# Patient Record
Sex: Male | Born: 1935
Health system: Southern US, Community
[De-identification: ages and names within clinical notes are randomized; demographics above are authoritative.]

## PROBLEM LIST (undated history)

## (undated) DIAGNOSIS — Z9981 Dependence on supplemental oxygen: Secondary | ICD-10-CM

## (undated) DIAGNOSIS — R310 Gross hematuria: Secondary | ICD-10-CM

## (undated) DIAGNOSIS — I5030 Unspecified diastolic (congestive) heart failure: Secondary | ICD-10-CM

## (undated) DIAGNOSIS — K5730 Diverticulosis of large intestine without perforation or abscess without bleeding: Secondary | ICD-10-CM

## (undated) DIAGNOSIS — R5383 Other fatigue: Secondary | ICD-10-CM

## (undated) DIAGNOSIS — M549 Dorsalgia, unspecified: Secondary | ICD-10-CM

## (undated) DIAGNOSIS — I2510 Atherosclerotic heart disease of native coronary artery without angina pectoris: Secondary | ICD-10-CM

## (undated) DIAGNOSIS — I10 Essential (primary) hypertension: Secondary | ICD-10-CM

## (undated) DIAGNOSIS — N183 Chronic kidney disease, stage 3 (moderate): Secondary | ICD-10-CM

## (undated) DIAGNOSIS — H9190 Unspecified hearing loss, unspecified ear: Secondary | ICD-10-CM

## (undated) DIAGNOSIS — R0600 Dyspnea, unspecified: Secondary | ICD-10-CM

## (undated) DIAGNOSIS — I739 Peripheral vascular disease, unspecified: Secondary | ICD-10-CM

## (undated) DIAGNOSIS — J189 Pneumonia, unspecified organism: Secondary | ICD-10-CM

## (undated) DIAGNOSIS — G8929 Other chronic pain: Secondary | ICD-10-CM

## (undated) DIAGNOSIS — T8859XA Other complications of anesthesia, initial encounter: Secondary | ICD-10-CM

## (undated) DIAGNOSIS — I509 Heart failure, unspecified: Secondary | ICD-10-CM

## (undated) DIAGNOSIS — T4145XA Adverse effect of unspecified anesthetic, initial encounter: Secondary | ICD-10-CM

## (undated) DIAGNOSIS — E119 Type 2 diabetes mellitus without complications: Secondary | ICD-10-CM

## (undated) DIAGNOSIS — IMO0001 Reserved for inherently not codable concepts without codable children: Secondary | ICD-10-CM

## (undated) DIAGNOSIS — C679 Malignant neoplasm of bladder, unspecified: Secondary | ICD-10-CM

## (undated) DIAGNOSIS — L409 Psoriasis, unspecified: Secondary | ICD-10-CM

## (undated) DIAGNOSIS — L408 Other psoriasis: Secondary | ICD-10-CM

## (undated) DIAGNOSIS — L03116 Cellulitis of left lower limb: Secondary | ICD-10-CM

## (undated) DIAGNOSIS — M1990 Unspecified osteoarthritis, unspecified site: Secondary | ICD-10-CM

## (undated) DIAGNOSIS — J449 Chronic obstructive pulmonary disease, unspecified: Secondary | ICD-10-CM

## (undated) DIAGNOSIS — N400 Enlarged prostate without lower urinary tract symptoms: Secondary | ICD-10-CM

## (undated) DIAGNOSIS — M79606 Pain in leg, unspecified: Secondary | ICD-10-CM

## (undated) HISTORY — DX: Dorsalgia, unspecified: M54.9

## (undated) HISTORY — DX: Malignant neoplasm of bladder, unspecified: C67.9

## (undated) HISTORY — DX: Cellulitis of left lower limb: L03.116

## (undated) HISTORY — DX: Benign prostatic hyperplasia without lower urinary tract symptoms: N40.0

## (undated) HISTORY — PX: PENILE PROSTHESIS IMPLANT: SHX240

## (undated) HISTORY — PX: VASECTOMY: SHX75

## (undated) HISTORY — DX: Other psoriasis: L40.8

## (undated) HISTORY — DX: Type 2 diabetes mellitus without complications: E11.9

## (undated) HISTORY — DX: Atherosclerotic heart disease of native coronary artery without angina pectoris: I25.10

## (undated) HISTORY — DX: Diverticulosis of large intestine without perforation or abscess without bleeding: K57.30

## (undated) HISTORY — PX: CARDIAC CATHETERIZATION: SHX172

## (undated) HISTORY — DX: Unspecified diastolic (congestive) heart failure: I50.30

## (undated) HISTORY — DX: Essential (primary) hypertension: I10

## (undated) HISTORY — PX: INCISION AND DRAINAGE FOOT: SHX1800

## (undated) HISTORY — PX: CATARACT EXTRACTION W/ INTRAOCULAR LENS  IMPLANT, BILATERAL: SHX1307

---
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
## 1960-11-11 HISTORY — PX: TONSILLECTOMY AND ADENOIDECTOMY: SUR1326

## 2004-08-11 LAB — HM COLONOSCOPY

## 2004-09-27 ENCOUNTER — Ambulatory Visit: Payer: Self-pay | Admitting: Internal Medicine

## 2005-01-06 ENCOUNTER — Ambulatory Visit: Payer: Self-pay | Admitting: Internal Medicine

## 2005-01-06 ENCOUNTER — Inpatient Hospital Stay (HOSPITAL_COMMUNITY): Admission: EM | Admit: 2005-01-06 | Discharge: 2005-01-08 | Payer: Self-pay | Admitting: Emergency Medicine

## 2005-01-10 ENCOUNTER — Encounter: Admission: RE | Admit: 2005-01-10 | Discharge: 2005-04-10 | Payer: Self-pay | Admitting: Internal Medicine

## 2005-01-15 ENCOUNTER — Ambulatory Visit: Payer: Self-pay | Admitting: Internal Medicine

## 2005-01-21 ENCOUNTER — Ambulatory Visit: Payer: Self-pay | Admitting: Internal Medicine

## 2005-01-30 ENCOUNTER — Encounter (HOSPITAL_BASED_OUTPATIENT_CLINIC_OR_DEPARTMENT_OTHER): Admission: RE | Admit: 2005-01-30 | Discharge: 2005-02-06 | Payer: Self-pay | Admitting: Internal Medicine

## 2005-02-01 ENCOUNTER — Encounter: Admission: RE | Admit: 2005-02-01 | Discharge: 2005-02-01 | Payer: Self-pay | Admitting: Internal Medicine

## 2005-02-02 ENCOUNTER — Emergency Department (HOSPITAL_COMMUNITY): Admission: EM | Admit: 2005-02-02 | Discharge: 2005-02-02 | Payer: Self-pay | Admitting: Emergency Medicine

## 2005-02-04 ENCOUNTER — Ambulatory Visit: Payer: Self-pay | Admitting: Internal Medicine

## 2005-02-04 ENCOUNTER — Encounter: Admission: RE | Admit: 2005-02-04 | Discharge: 2005-02-04 | Payer: Self-pay | Admitting: Internal Medicine

## 2005-02-11 ENCOUNTER — Ambulatory Visit: Payer: Self-pay | Admitting: Internal Medicine

## 2005-02-11 ENCOUNTER — Encounter (HOSPITAL_BASED_OUTPATIENT_CLINIC_OR_DEPARTMENT_OTHER): Admission: RE | Admit: 2005-02-11 | Discharge: 2005-05-12 | Payer: Self-pay | Admitting: Surgery

## 2005-02-18 ENCOUNTER — Ambulatory Visit: Payer: Self-pay | Admitting: Internal Medicine

## 2005-03-18 ENCOUNTER — Ambulatory Visit: Payer: Self-pay | Admitting: Internal Medicine

## 2005-05-20 ENCOUNTER — Ambulatory Visit: Payer: Self-pay | Admitting: Internal Medicine

## 2005-05-27 ENCOUNTER — Ambulatory Visit: Payer: Self-pay | Admitting: Internal Medicine

## 2005-06-19 ENCOUNTER — Ambulatory Visit: Payer: Self-pay | Admitting: Internal Medicine

## 2005-08-20 ENCOUNTER — Ambulatory Visit: Payer: Self-pay | Admitting: Internal Medicine

## 2005-11-11 HISTORY — PX: CARDIOVASCULAR STRESS TEST: SHX262

## 2005-11-18 ENCOUNTER — Ambulatory Visit: Payer: Self-pay | Admitting: Internal Medicine

## 2006-02-17 ENCOUNTER — Ambulatory Visit: Payer: Self-pay | Admitting: Internal Medicine

## 2006-05-19 ENCOUNTER — Ambulatory Visit: Payer: Self-pay | Admitting: Internal Medicine

## 2006-05-23 ENCOUNTER — Encounter: Payer: Self-pay | Admitting: Internal Medicine

## 2006-05-23 ENCOUNTER — Ambulatory Visit: Payer: Self-pay

## 2006-07-28 ENCOUNTER — Ambulatory Visit: Payer: Self-pay | Admitting: Internal Medicine

## 2006-09-04 ENCOUNTER — Ambulatory Visit: Payer: Self-pay | Admitting: Internal Medicine

## 2006-10-27 ENCOUNTER — Ambulatory Visit: Payer: Self-pay | Admitting: Internal Medicine

## 2006-10-27 LAB — CONVERTED CEMR LAB: Hgb A1c MFr Bld: 10.7 % — ABNORMAL HIGH (ref 4.6–6.0)

## 2007-01-20 ENCOUNTER — Ambulatory Visit: Payer: Self-pay | Admitting: Internal Medicine

## 2007-01-20 LAB — CONVERTED CEMR LAB: Hgb A1c MFr Bld: 7.5 % — ABNORMAL HIGH (ref 4.6–6.0)

## 2007-04-21 ENCOUNTER — Ambulatory Visit: Payer: Self-pay | Admitting: Internal Medicine

## 2007-04-21 LAB — CONVERTED CEMR LAB: Hgb A1c MFr Bld: 7.1 % — ABNORMAL HIGH (ref 4.6–6.0)

## 2007-05-21 ENCOUNTER — Ambulatory Visit: Payer: Self-pay | Admitting: Internal Medicine

## 2007-05-21 ENCOUNTER — Encounter: Payer: Self-pay | Admitting: Internal Medicine

## 2007-05-21 DIAGNOSIS — I959 Hypotension, unspecified: Secondary | ICD-10-CM | POA: Insufficient documentation

## 2007-05-21 DIAGNOSIS — N400 Enlarged prostate without lower urinary tract symptoms: Secondary | ICD-10-CM | POA: Insufficient documentation

## 2007-05-21 DIAGNOSIS — E119 Type 2 diabetes mellitus without complications: Secondary | ICD-10-CM | POA: Insufficient documentation

## 2007-05-21 DIAGNOSIS — K5730 Diverticulosis of large intestine without perforation or abscess without bleeding: Secondary | ICD-10-CM

## 2007-05-21 DIAGNOSIS — I10 Essential (primary) hypertension: Secondary | ICD-10-CM

## 2007-05-21 DIAGNOSIS — E1142 Type 2 diabetes mellitus with diabetic polyneuropathy: Secondary | ICD-10-CM | POA: Insufficient documentation

## 2007-05-21 HISTORY — DX: Essential (primary) hypertension: I10

## 2007-05-21 HISTORY — DX: Benign prostatic hyperplasia without lower urinary tract symptoms: N40.0

## 2007-05-21 HISTORY — DX: Type 2 diabetes mellitus without complications: E11.9

## 2007-05-21 HISTORY — DX: Diverticulosis of large intestine without perforation or abscess without bleeding: K57.30

## 2007-08-03 ENCOUNTER — Encounter: Payer: Self-pay | Admitting: Internal Medicine

## 2007-08-11 ENCOUNTER — Ambulatory Visit: Payer: Self-pay | Admitting: Internal Medicine

## 2007-08-21 ENCOUNTER — Ambulatory Visit: Payer: Self-pay | Admitting: Internal Medicine

## 2007-11-16 ENCOUNTER — Ambulatory Visit: Payer: Self-pay | Admitting: Internal Medicine

## 2007-11-16 DIAGNOSIS — J159 Unspecified bacterial pneumonia: Secondary | ICD-10-CM | POA: Insufficient documentation

## 2007-11-25 ENCOUNTER — Ambulatory Visit: Payer: Self-pay | Admitting: Internal Medicine

## 2007-11-26 LAB — CONVERTED CEMR LAB
ALT: 14 units/L (ref 0–53)
AST: 18 units/L (ref 0–37)
Albumin: 3.9 g/dL (ref 3.5–5.2)
Alkaline Phosphatase: 42 units/L (ref 39–117)
BUN: 39 mg/dL — ABNORMAL HIGH (ref 6–23)
Basophils Absolute: 0 10*3/uL (ref 0.0–0.1)
Basophils Relative: 0.2 % (ref 0.0–1.0)
Bilirubin Urine: NEGATIVE
Bilirubin, Direct: 0.1 mg/dL (ref 0.0–0.3)
CO2: 29 meq/L (ref 19–32)
Calcium: 10 mg/dL (ref 8.4–10.5)
Chloride: 102 meq/L (ref 96–112)
Cholesterol: 192 mg/dL (ref 0–200)
Creatinine, Ser: 1.7 mg/dL — ABNORMAL HIGH (ref 0.4–1.5)
Creatinine,U: 96.2 mg/dL
Eosinophils Absolute: 0.2 10*3/uL (ref 0.0–0.6)
Eosinophils Relative: 2.5 % (ref 0.0–5.0)
GFR calc Af Amer: 51 mL/min
GFR calc non Af Amer: 42 mL/min
Glucose, Bld: 137 mg/dL — ABNORMAL HIGH (ref 70–99)
Glucose, Urine, Semiquant: 100
HCT: 36.7 % — ABNORMAL LOW (ref 39.0–52.0)
Hemoglobin: 13.1 g/dL (ref 13.0–17.0)
Hgb A1c MFr Bld: 7.3 % — ABNORMAL HIGH (ref 4.6–6.0)
Ketones, urine, test strip: NEGATIVE
Lymphocytes Relative: 18.9 % (ref 12.0–46.0)
MCHC: 34.9 g/dL (ref 30.0–36.0)
MCV: 94.6 fL (ref 78.0–100.0)
Microalb Creat Ratio: 20.8 mg/g (ref 0.0–30.0)
Microalb, Ur: 2 mg/dL — ABNORMAL HIGH (ref 0.0–1.9)
Monocytes Absolute: 0.6 10*3/uL (ref 0.2–0.7)
Monocytes Relative: 6.6 % (ref 3.0–11.0)
Neutro Abs: 6.4 10*3/uL (ref 1.4–7.7)
Neutrophils Relative %: 71.8 % (ref 43.0–77.0)
Nitrite: NEGATIVE
PSA: 1.84 ng/mL (ref 0.10–4.00)
Platelets: 264 10*3/uL (ref 150–400)
Potassium: 6.1 meq/L (ref 3.5–5.1)
Protein, U semiquant: NEGATIVE
RBC: 3.95 M/uL — ABNORMAL LOW (ref 4.22–5.81)
RDW: 12.5 % (ref 11.5–14.6)
Sodium: 140 meq/L (ref 135–145)
Specific Gravity, Urine: 1.02
TSH: 1.4 microintl units/mL (ref 0.35–5.50)
Total Bilirubin: 0.4 mg/dL (ref 0.3–1.2)
Total Protein: 7.2 g/dL (ref 6.0–8.3)
Urobilinogen, UA: 0.2
WBC Urine, dipstick: NEGATIVE
WBC: 8.7 10*3/uL (ref 4.5–10.5)
pH: 5

## 2007-12-04 ENCOUNTER — Ambulatory Visit: Payer: Self-pay | Admitting: Internal Medicine

## 2007-12-04 DIAGNOSIS — N183 Chronic kidney disease, stage 3 unspecified: Secondary | ICD-10-CM

## 2007-12-04 HISTORY — DX: Chronic kidney disease, stage 3 unspecified: N18.30

## 2007-12-30 ENCOUNTER — Encounter: Payer: Self-pay | Admitting: Internal Medicine

## 2008-02-03 ENCOUNTER — Ambulatory Visit: Payer: Self-pay | Admitting: Internal Medicine

## 2008-04-07 ENCOUNTER — Ambulatory Visit: Payer: Self-pay | Admitting: Internal Medicine

## 2008-04-07 DIAGNOSIS — T25229A Burn of second degree of unspecified foot, initial encounter: Secondary | ICD-10-CM | POA: Insufficient documentation

## 2008-07-01 ENCOUNTER — Ambulatory Visit: Payer: Self-pay | Admitting: Internal Medicine

## 2008-07-22 ENCOUNTER — Encounter: Payer: Self-pay | Admitting: Internal Medicine

## 2008-08-08 ENCOUNTER — Ambulatory Visit: Payer: Self-pay | Admitting: Internal Medicine

## 2008-10-25 ENCOUNTER — Ambulatory Visit: Payer: Self-pay | Admitting: Internal Medicine

## 2008-10-25 DIAGNOSIS — L408 Other psoriasis: Secondary | ICD-10-CM | POA: Insufficient documentation

## 2008-10-25 HISTORY — DX: Other psoriasis: L40.8

## 2008-10-25 LAB — CONVERTED CEMR LAB: Hgb A1c MFr Bld: 7 % — ABNORMAL HIGH (ref 4.6–6.0)

## 2008-11-29 ENCOUNTER — Encounter: Payer: Self-pay | Admitting: Internal Medicine

## 2008-12-30 ENCOUNTER — Encounter: Payer: Self-pay | Admitting: Internal Medicine

## 2009-01-24 ENCOUNTER — Ambulatory Visit: Payer: Self-pay | Admitting: Internal Medicine

## 2009-01-24 LAB — CONVERTED CEMR LAB: Hgb A1c MFr Bld: 7.4 % — ABNORMAL HIGH (ref 4.6–6.5)

## 2009-04-26 ENCOUNTER — Encounter: Payer: Self-pay | Admitting: Internal Medicine

## 2009-04-27 ENCOUNTER — Ambulatory Visit: Payer: Self-pay | Admitting: Internal Medicine

## 2009-04-27 LAB — CONVERTED CEMR LAB
ALT: 11 units/L (ref 0–53)
AST: 17 units/L (ref 0–37)
Albumin: 4 g/dL (ref 3.5–5.2)
Alkaline Phosphatase: 44 units/L (ref 39–117)
BUN: 36 mg/dL — ABNORMAL HIGH (ref 6–23)
Basophils Absolute: 0 10*3/uL (ref 0.0–0.1)
Basophils Relative: 0 % (ref 0.0–3.0)
Bilirubin, Direct: 0 mg/dL (ref 0.0–0.3)
CO2: 30 meq/L (ref 19–32)
Calcium: 9 mg/dL (ref 8.4–10.5)
Chloride: 104 meq/L (ref 96–112)
Cholesterol: 203 mg/dL — ABNORMAL HIGH (ref 0–200)
Creatinine, Ser: 1.8 mg/dL — ABNORMAL HIGH (ref 0.4–1.5)
Direct LDL: 147.6 mg/dL
Eosinophils Absolute: 0.3 10*3/uL (ref 0.0–0.7)
Eosinophils Relative: 4 % (ref 0.0–5.0)
GFR calc non Af Amer: 38.93 mL/min (ref >60)
Glucose, Bld: 96 mg/dL (ref 70–99)
HCT: 40.7 % (ref 39.0–52.0)
HDL: 33.3 mg/dL — ABNORMAL LOW (ref >39.00)
Hemoglobin: 14.1 g/dL (ref 13.0–17.0)
Hgb A1c MFr Bld: 7.4 % — ABNORMAL HIGH (ref 4.6–6.5)
Lymphocytes Relative: 15.3 % (ref 12.0–46.0)
Lymphs Abs: 1.1 10*3/uL (ref 0.7–4.0)
MCHC: 34.5 g/dL (ref 30.0–36.0)
MCV: 96.1 fL (ref 78.0–100.0)
Monocytes Absolute: 0.7 10*3/uL (ref 0.1–1.0)
Monocytes Relative: 9.1 % (ref 3.0–12.0)
Neutro Abs: 5.3 10*3/uL (ref 1.4–7.7)
Neutrophils Relative %: 71.6 % (ref 43.0–77.0)
PSA: 1.78 ng/mL (ref 0.10–4.00)
Platelets: 194 10*3/uL (ref 150.0–400.0)
Potassium: 5.6 meq/L — ABNORMAL HIGH (ref 3.5–5.1)
RBC: 4.24 M/uL (ref 4.22–5.81)
RDW: 12.7 % (ref 11.5–14.6)
Sodium: 143 meq/L (ref 135–145)
TSH: 1.29 microintl units/mL (ref 0.35–5.50)
Total Bilirubin: 0.9 mg/dL (ref 0.3–1.2)
Total CHOL/HDL Ratio: 6
Total Protein: 7.7 g/dL (ref 6.0–8.3)
Triglycerides: 147 mg/dL (ref 0.0–149.0)
VLDL: 29.4 mg/dL (ref 0.0–40.0)
WBC: 7.4 10*3/uL (ref 4.5–10.5)

## 2009-05-01 ENCOUNTER — Telehealth: Payer: Self-pay | Admitting: Internal Medicine

## 2009-07-28 ENCOUNTER — Ambulatory Visit: Payer: Self-pay | Admitting: Internal Medicine

## 2009-08-01 ENCOUNTER — Ambulatory Visit: Payer: Self-pay | Admitting: Internal Medicine

## 2009-08-01 LAB — CONVERTED CEMR LAB
BUN: 46 mg/dL — ABNORMAL HIGH (ref 6–23)
CO2: 29 meq/L (ref 19–32)
Calcium: 9 mg/dL (ref 8.4–10.5)
Chloride: 108 meq/L (ref 96–112)
Creatinine, Ser: 2.1 mg/dL — ABNORMAL HIGH (ref 0.4–1.5)
Creatinine,U: 77.7 mg/dL
GFR calc non Af Amer: 32.57 mL/min (ref >60)
Glucose, Bld: 136 mg/dL — ABNORMAL HIGH (ref 70–99)
Hgb A1c MFr Bld: 7 % — ABNORMAL HIGH (ref 4.6–6.5)
Microalb Creat Ratio: 19.3 mg/g (ref 0.0–30.0)
Microalb, Ur: 1.5 mg/dL (ref 0.0–1.9)
Potassium: 5.5 meq/L — ABNORMAL HIGH (ref 3.5–5.1)
Sodium: 143 meq/L (ref 135–145)

## 2009-08-30 ENCOUNTER — Ambulatory Visit: Payer: Self-pay | Admitting: Internal Medicine

## 2009-09-18 ENCOUNTER — Encounter (INDEPENDENT_AMBULATORY_CARE_PROVIDER_SITE_OTHER): Payer: Self-pay | Admitting: *Deleted

## 2009-09-22 ENCOUNTER — Encounter: Admission: RE | Admit: 2009-09-22 | Discharge: 2009-09-22 | Payer: Self-pay | Admitting: Nephrology

## 2009-10-27 ENCOUNTER — Ambulatory Visit: Payer: Self-pay | Admitting: Internal Medicine

## 2009-10-30 LAB — CONVERTED CEMR LAB: Hgb A1c MFr Bld: 8.2 % — ABNORMAL HIGH (ref 4.6–6.5)

## 2009-11-20 ENCOUNTER — Encounter: Payer: Self-pay | Admitting: Internal Medicine

## 2010-01-05 ENCOUNTER — Encounter: Payer: Self-pay | Admitting: Internal Medicine

## 2010-01-24 ENCOUNTER — Ambulatory Visit: Payer: Self-pay | Admitting: Internal Medicine

## 2010-01-24 LAB — CONVERTED CEMR LAB: Hgb A1c MFr Bld: 8.8 % — ABNORMAL HIGH (ref 4.6–6.5)

## 2010-01-31 ENCOUNTER — Telehealth: Payer: Self-pay | Admitting: Internal Medicine

## 2010-03-19 ENCOUNTER — Encounter: Payer: Self-pay | Admitting: Internal Medicine

## 2010-04-23 ENCOUNTER — Ambulatory Visit: Payer: Self-pay | Admitting: Internal Medicine

## 2010-04-23 LAB — CONVERTED CEMR LAB: Hgb A1c MFr Bld: 7.2 % — ABNORMAL HIGH (ref 4.6–6.5)

## 2010-07-06 ENCOUNTER — Encounter: Payer: Self-pay | Admitting: Internal Medicine

## 2010-07-09 ENCOUNTER — Encounter: Payer: Self-pay | Admitting: Internal Medicine

## 2010-07-11 ENCOUNTER — Encounter: Payer: Self-pay | Admitting: Internal Medicine

## 2010-07-17 ENCOUNTER — Encounter: Payer: Self-pay | Admitting: Internal Medicine

## 2010-07-24 ENCOUNTER — Ambulatory Visit: Payer: Self-pay | Admitting: Internal Medicine

## 2010-07-24 LAB — CONVERTED CEMR LAB: Hgb A1c MFr Bld: 7.5 % — ABNORMAL HIGH (ref 4.6–6.5)

## 2010-07-31 ENCOUNTER — Telehealth: Payer: Self-pay | Admitting: Internal Medicine

## 2010-09-25 ENCOUNTER — Telehealth: Payer: Self-pay | Admitting: Internal Medicine

## 2010-10-23 ENCOUNTER — Ambulatory Visit: Payer: Self-pay | Admitting: Internal Medicine

## 2010-10-23 LAB — HM DIABETES EYE EXAM

## 2010-10-25 LAB — CONVERTED CEMR LAB: Hgb A1c MFr Bld: 8.4 % — ABNORMAL HIGH (ref 4.6–6.5)

## 2010-10-29 ENCOUNTER — Telehealth: Payer: Self-pay

## 2010-11-20 ENCOUNTER — Encounter: Payer: Self-pay | Admitting: Internal Medicine

## 2010-11-22 ENCOUNTER — Encounter
Admission: RE | Admit: 2010-11-22 | Discharge: 2010-11-22 | Payer: Self-pay | Source: Home / Self Care | Attending: Nephrology | Admitting: Nephrology

## 2010-12-09 LAB — CONVERTED CEMR LAB
BUN: 27 mg/dL — ABNORMAL HIGH (ref 6–23)
CO2: 30 meq/L (ref 19–32)
Calcium: 9.7 mg/dL (ref 8.4–10.5)
Chloride: 100 meq/L (ref 96–112)
Creatinine, Ser: 1.4 mg/dL (ref 0.4–1.5)
GFR calc Af Amer: 63 mL/min
GFR calc non Af Amer: 52 mL/min
Glucose, Bld: 106 mg/dL — ABNORMAL HIGH (ref 70–99)
Potassium: 5.7 meq/L — ABNORMAL HIGH (ref 3.5–5.1)
Sodium: 138 meq/L (ref 135–145)

## 2010-12-11 NOTE — Assessment & Plan Note (Signed)
Summary: flu shot//ccm  Nurse Visit  Flu Vaccine Consent Questions     Do you have a history of severe allergic reactions to this vaccine? no    Any prior history of allergic reactions to egg and/or gelatin? no    Do you have a sensitivity to the preservative Thimersol? no    Do you have a past history of Guillan-Barre Syndrome? no    Do you currently have an acute febrile illness? no    Have you ever had a severe reaction to latex? no    Vaccine information given and explained to patient? yes    Are you currently pregnant? no    Lot Number:[id number]   Exp Date:05/10/2010   Site Given  Left Deltoid IM    Allergies: 1)  Amoxicillin (Amoxicillin) 2)  Levaquin (Levofloxacin)  Orders Added: 1)  Admin 1st Vaccine [90471] 2)  Flu Vaccine 2yrs + [id number]

## 2010-12-11 NOTE — Consult Note (Signed)
Summary: [facility]   Imported By: Laural Benes 12/23/2008 13:00:13  _____________________________________________________________________  External Attachment:    Type:   Image     Comment:   External Document

## 2010-12-11 NOTE — Letter (Signed)
Summary: [facility]   Imported By: Laural Benes 04/10/2010 10:25:27  _____________________________________________________________________  External Attachment:    Type:   Image     Comment:   External Document

## 2010-12-11 NOTE — Assessment & Plan Note (Signed)
Summary: 3 month rov/njr   Vital Signs:  Patient profile:   77 year old male Weight:      229 pounds Temp:     97.9 degrees F oral BP sitting:   130 / 70  (right arm) Cuff size:   regular  Vitals Entered By: Cay Schillings LPN (September 13, 624THL 9:51 AM) CC: 3 mos rov - doing well      fbs 121 Is Patient Diabetic? Yes Did you bring your meter with you today? No Flu Vaccine Consent Questions     Do you have a history of severe allergic reactions to this vaccine? no    Any prior history of allergic reactions to egg and/or gelatin? no    Do you have a sensitivity to the preservative Thimersol? no    Do you have a past history of Guillan-Barre Syndrome? no    Do you currently have an acute febrile illness? no    Have you ever had a severe reaction to latex? no    Vaccine information given and explained to patient? yes    Are you currently pregnant? no    Lot Number:[id number]   Exp Date:05/11/2011   Site Given  Left Deltoid IM   CC:  3 mos rov - doing well      fbs 121.  History of Present Illness: 77 year old patient who is seen today for follow-up of his type 2 diabetes.  He has hypertension and chronic kidney disease.  He has recently seen in nephrology and renal indices are pending.  He feels well today.  His only complaint is worsening pedal edema.  Medical regimen includes Actos 45, as well as amlodipine.  Apparently, his potassium was elevated recently  Allergies: 1)  Amoxicillin (Amoxicillin) 2)  Levaquin (Levofloxacin)  Past History:  Past Medical History: Reviewed history from 12/04/2007 and no changes required. Diabetes mellitus, type II Hypertension Benign prostatic hypertrophy Diverticulosis, colon chronic kidney disease hospitalized in 2006 for left foot cellulitis  Past Surgical History: Reviewed history from 04/27/2009 and no changes required. Appendectomy Vasectomy Penile prosthesis Stress test 2007 colonoscopy 5-7 years ago  Review of  Systems       The patient complains of peripheral edema.  The patient denies anorexia, fever, weight loss, weight gain, vision loss, decreased hearing, hoarseness, chest pain, syncope, dyspnea on exertion, prolonged cough, headaches, hemoptysis, abdominal pain, melena, hematochezia, severe indigestion/heartburn, hematuria, incontinence, genital sores, muscle weakness, suspicious skin lesions, transient blindness, difficulty walking, depression, unusual weight change, abnormal bleeding, enlarged lymph nodes, angioedema, breast masses, and testicular masses.    Physical Exam  General:  overweight-appearing.  120/74overweight-appearing.   Head:  Normocephalic and atraumatic without obvious abnormalities. No apparent alopecia or balding. Eyes:  No corneal or conjunctival inflammation noted. EOMI. Perrla. Funduscopic exam benign, without hemorrhages, exudates or papilledema. Vision grossly normal. Mouth:  Oral mucosa and oropharynx without lesions or exudates.  Teeth in good repair. Neck:  No deformities, masses, or tenderness noted. Lungs:  Normal respiratory effort, chest expands symmetrically. Lungs are clear to auscultation, no crackles or wheezes. Heart:  Normal rate and regular rhythm. S1 and S2 normal without gallop, murmur, click, rub or other extra sounds. Abdomen:  Bowel sounds positive,abdomen soft and non-tender without masses, organomegaly or hernias noted. Msk:  No deformity or scoliosis noted of thoracic or lumbar spine.   Extremities:  2+ left pedal edema and 2+ right pedal edema.  2+ left pedal edema and 2+ right pedal edema.     Impression &  Recommendations:  Problem # 1:  CHRONIC KIDNEY DISEASE UNSPECIFIED (ICD-585.9)  Problem # 2:  HYPERTENSION (ICD-401.9)  The following medications were removed from the medication list:    Hydrochlorothiazide 25 Mg Tabs (Hydrochlorothiazide) ..... One daily in the morning His updated medication list for this problem includes:    Lisinopril  40 Mg Tabs (Lisinopril) .Marland Kitchen... 1 once daily    Amlodipine Besylate 5 Mg Tabs (Amlodipine besylate) ..... Qhs    Furosemide 40 Mg Tabs (Furosemide) ..... One daily, as needed for swelling  The following medications were removed from the medication list:    Hydrochlorothiazide 25 Mg Tabs (Hydrochlorothiazide) ..... One daily in the morning His updated medication list for this problem includes:    Lisinopril 40 Mg Tabs (Lisinopril) .Marland Kitchen... 1 once daily    Amlodipine Besylate 5 Mg Tabs (Amlodipine besylate) ..... Qhs    Furosemide 40 Mg Tabs (Furosemide) ..... One daily, as needed for swelling  Problem # 3:  DIABETES MELLITUS, TYPE II (ICD-250.00)  The following medications were removed from the medication list:    Amaryl 4 Mg Tabs (Glimepiride) .Marland Kitchen... 1 once daily    Actos 45 Mg Tabs (Pioglitazone hcl) ..... One daily    Januvia 50 Mg Tabs (Sitagliptin phosphate) ..... One daily His updated medication list for this problem includes:    Aspirin Ec 81 Mg Tbec (Aspirin) .Marland Kitchen... Take 1 tablet by mouth every morning    Lisinopril 40 Mg Tabs (Lisinopril) .Marland Kitchen... 1 once daily    Glipizide 10 Mg Xr24h-tab (Glipizide) ..... One every morning    Actos 15 Mg Tabs (Pioglitazone hcl) ..... One daily    Tradjenta 5 Mg Tabs (Linagliptin) ..... One daily  Orders: Venipuncture [id number]) TLB-A1C / Hgb A1C (Glycohemoglobin) (83036-A1C) Specimen Handling (99000)  Complete Medication List: 1)  Aspirin Ec 81 Mg Tbec (Aspirin) .... Take 1 tablet by mouth every morning 2)  Lisinopril 40 Mg Tabs (Lisinopril) .Marland Kitchen.. 1 once daily 3)  Amlodipine Besylate 5 Mg Tabs (Amlodipine besylate) .... Qhs 4)  Allopurinol 100 Mg Tabs (Allopurinol) .... Qd 5)  Glipizide 10 Mg Xr24h-tab (Glipizide) .... One every morning 6)  Actos 15 Mg Tabs (Pioglitazone hcl) .... One daily 7)  Tradjenta 5 Mg Tabs (Linagliptin) .... One daily 8)  Furosemide 40 Mg Tabs (Furosemide) .... One daily, as needed for swelling  Other Orders: Admin 1st  Vaccine [id number]) Flu Vaccine 28yrs + [id number])  Patient Instructions: 1)  Please schedule a follow-up appointment in 3 months. 2)  Limit your Sodium (Salt). 3)  It is important that you exercise regularly at least 20 minutes 5 times a week. If you develop chest pain, have severe difficulty breathing, or feel very tired , stop exercising immediately and seek medical attention. 4)  You need to lose weight. Consider a lower calorie diet and regular exercise.  5)  Check your blood sugars regularly. If your readings are usually above : or below 70 you should contact our office. 6)  It is important that your Diabetic A1c level is checked every 3 months. Prescriptions: TRADJENTA 5 MG TABS (LINAGLIPTIN) one daily  #90 x 6   Entered and Authorized by:   Marletta Lor  MD   Signed by:   Marletta Lor  MD on 07/24/2010   Method used:   Electronically to        [facility] (retail)             ,  Ph: [phone]       Fax: [phone]   RxIDDB:[id number] ACTOS 15 MG TABS (PIOGLITAZONE HCL) one daily  #90 x 6   Entered and Authorized by:   Marletta Lor  MD   Signed by:   Marletta Lor  MD on 07/24/2010   Method used:   Electronically to        [facility] (retail)             ,          Ph: [phone]       Fax: [phone]   RxIDLC:[id number] GLIPIZIDE 10 MG XR24H-TAB (GLIPIZIDE) one every morning  #90 x 6   Entered and Authorized by:   Marletta Lor  MD   Signed by:   Marletta Lor  MD on 07/24/2010   Method used:   Electronically to        [facility] (retail)             ,          Ph: [phone]       Fax: [phone]   RxIDCS:[id number] ALLOPURINOL 100 MG TABS (ALLOPURINOL) qd  #90 x 6   Entered and Authorized by:   Marletta Lor  MD   Signed by:   Marletta Lor  MD on 07/24/2010   Method used:   Electronically to        [facility] (retail)             ,          Ph: [phone]        Fax: [phone]   RxIDQN:[id number] AMLODIPINE BESYLATE 5 MG TABS (AMLODIPINE BESYLATE) qhs  #90 x 6   Entered and Authorized by:   Marletta Lor  MD   Signed by:   Marletta Lor  MD on 07/24/2010   Method used:   Electronically to        [facility] (retail)             ,          Ph: [phone]       Fax: [phone]   RxIDJZ:[id number] LISINOPRIL 40 MG  TABS (LISINOPRIL) 1 once daily  #90 x 6   Entered and Authorized by:   Marletta Lor  MD   Signed by:   Marletta Lor  MD on 07/24/2010   Method used:   Electronically to        [facility] (retail)             ,          Ph: [phone]       Fax: [phone]   RxIDNF:[id number]

## 2010-12-11 NOTE — Assessment & Plan Note (Signed)
Summary: flu shot/njr  Nurse Visit   Impression & Recommendations: lot [id number].Exp 05/10/2008 [employer] pasteur-left deltoid IM.0.43ml   Complete Medication List: 1)  Actos 45 Mg Tabs (Pioglitazone hcl) 2)  Amaryl 4 Mg Tabs (Glimepiride) .... Take 1 tablet by mouth once a day 3)  Amlodipine Besylate 5 Mg Tabs (Amlodipine besylate) .... Take 1 tablet by mouth once a day 4)  Aspirin Ec 81 Mg Tbec (Aspirin) .... Take 1 tablet by mouth every morning 5)  Lisinopril 40 Mg Tabs (Lisinopril) .Marland Kitchen.. 1 once daily 6)  Metformin Hcl 1000 Mg Tabs (Metformin hcl) .... Take 1 tablet by mouth twice a day 7)  Hydrochlorothiazide 25 Mg Tabs (Hydrochlorothiazide) .Marland Kitchen.. 1 once daily 8)  Actos 15 Mg Tabs (Pioglitazone hcl) .Marland Kitchen.. 1 once daily    Prior Medications: ACTOS 45 MG TABS (PIOGLITAZONE HCL)  AMARYL 4 MG TABS (GLIMEPIRIDE) Take 1 tablet by mouth once a day AMLODIPINE BESYLATE 5 MG TABS (AMLODIPINE BESYLATE) Take 1 tablet by mouth once a day ASPIRIN EC 81 MG TBEC (ASPIRIN) Take 1 tablet by mouth every morning LISINOPRIL 40 MG TABS (LISINOPRIL) 1 once daily METFORMIN HCL 1000 MG TABS (METFORMIN HCL) Take 1 tablet by mouth twice a day HYDROCHLOROTHIAZIDE 25 MG  TABS (HYDROCHLOROTHIAZIDE) 1 once daily ACTOS 15 MG  TABS (PIOGLITAZONE HCL) 1 once daily Current Allergies: AMOXICILLIN (AMOXICILLIN) LEVAQUIN (LEVOFLOXACIN)   Influenza Vaccine    Vaccine Type: Fluvax MCR    Given by: Allyne Gee, LPN  Flu Vaccine Consent Questions    Do you have a history of severe allergic reactions to this vaccine? no    Any prior history of allergic reactions to egg and/or gelatin? no    Do you have a sensitivity to the preservative Thimersol? no    Do you have a past history of Guillan-Barre Syndrome? no    Do you currently have an acute febrile illness? no    Have you ever had a severe reaction to latex? no    Vaccine information given and explained to patient? yes   Orders Added: 1)  Influenza Vaccine  MCR [id number]    ]

## 2010-12-11 NOTE — Assessment & Plan Note (Signed)
Summary: 3 month rov/njr  Or  Vital Signs:  Small profile:   77 year old male Weight:      212 pounds Temp:     97.7 degrees F oral BP sitting:   140 / 78  (left arm) Cuff size:   regular  Vitals Entered By: Chipper Oman, RN (July 28, 2009 10:18 AM) CC: 3 mo ROV, FBS 130 Is Small Diabetic? Yes   CC:  3 mo ROV and FBS 130.  History of Present Illness: [name] Small who is seen today for follow-up.  He has type 2 diabetes, hypertension, and chronic kidney disease.  His blood sugars remains stable. He has discontinued   amlodipine due to dizziness and weakness and has felt much improved.  Blood pressure today was normal.  His last creatinine  was 1.8. Will recheck today and if not 1.6, or less.  Will need to discontinue metformin  Allergies: 1)  Amoxicillin (Amoxicillin) 2)  Levaquin (Levofloxacin)  Past History:  Past Medical History: Reviewed history from 12/04/2007 and no changes required. Diabetes mellitus, type II Hypertension Benign prostatic hypertrophy Diverticulosis, colon chronic kidney disease hospitalized in 2006 for left foot cellulitis  Past Surgical History: Reviewed history from 04/27/2009 and no changes required. Appendectomy Vasectomy Penile prosthesis Stress test 2007 colonoscopy 5-7 years ago  Family History: Reviewed history from 04/27/2009 and no changes required.  father died 39 mother died age 40, diabetes one of the 79  siblings 84 brothers positive for cardiac disease.  Brain tumor, renal failure  Review of Systems  The Small denies anorexia, fever, weight loss, weight gain, vision loss, decreased hearing, hoarseness, chest pain, syncope, dyspnea on exertion, peripheral edema, prolonged cough, headaches, hemoptysis, abdominal pain, melena, hematochezia, severe indigestion/heartburn, hematuria, incontinence, genital sores, muscle weakness, suspicious skin lesions, transient blindness, difficulty walking, depression,  unusual weight change, abnormal bleeding, enlarged lymph nodes, angioedema, breast masses, and testicular masses.    Physical Exam  General:  Well-developed,well-nourished,in no acute distress; alert,appropriate and cooperative throughout examination; 130/80 Head:  Normocephalic and atraumatic without obvious abnormalities. No apparent alopecia or balding. Eyes:  No corneal or conjunctival inflammation noted. EOMI. Perrla. Funduscopic exam benign, without hemorrhages, exudates or papilledema. Vision grossly normal. Mouth:  Oral mucosa and oropharynx without lesions or exudates.  Teeth in good repair. Neck:  No deformities, masses, or tenderness noted. Lungs:  Normal respiratory effort, chest expands symmetrically. Lungs are clear to auscultation, no crackles or wheezes. Heart:  Normal rate and regular rhythm. S1 and S2 normal without gallop, murmur, click, rub or other extra sounds. Abdomen:  Bowel sounds positive,abdomen soft and non-tender without masses, organomegaly or hernias noted. Pulses:  right posterior tibia pulse diminished Extremities:  No clubbing, cyanosis, edema, or deformity noted with normal full range of motion of all joints.    Diabetes Management Exam:    Eye Exam:       Eye Exam done here today          Results: normal   Impression & Recommendations:  Problem # 1:  CHRONIC KIDNEY DISEASE UNSPECIFIED (ICD-585.9)  Orders: Venipuncture [id number]) TLB-BMP (Basic Metabolic Panel-BMET) (99991111)  Problem # 2:  HYPERTENSION (ICD-401.9)  His updated medication list for this problem includes:    Lisinopril 40 Mg Tabs (Lisinopril) .Marland Kitchen... 1 once daily    Hydrochlorothiazide 25 Mg Tabs (Hydrochlorothiazide) ..... One daily in the morning  His updated medication list for this problem includes:    Lisinopril 40 Mg Tabs (Lisinopril) .Marland Kitchen... 1 once daily  Hydrochlorothiazide 25 Mg Tabs (Hydrochlorothiazide) ..... One daily in the morning  Problem # 3:  DIABETES MELLITUS,  TYPE II (ICD-250.00)  His updated medication list for this problem includes:    Aspirin Ec 81 Mg Tbec (Aspirin) .Marland Kitchen... Take 1 tablet by mouth every morning    Metformin Hcl 1000 Mg Tabs (Metformin hcl) .Marland Kitchen... Take 1 tablet by mouth twice a day    Lisinopril 40 Mg Tabs (Lisinopril) .Marland Kitchen... 1 once daily    Amaryl 4 Mg Tabs (Glimepiride) .Marland Kitchen... 1 once daily    His updated medication list for this problem includes:    Aspirin Ec 81 Mg Tbec (Aspirin) .Marland Kitchen... Take 1 tablet by mouth every morning    Metformin Hcl 1000 Mg Tabs (Metformin hcl) .Marland Kitchen... Take 1 tablet by mouth twice a day    Lisinopril 40 Mg Tabs (Lisinopril) .Marland Kitchen... 1 once daily    Amaryl 4 Mg Tabs (Glimepiride) .Marland Kitchen... 1 once daily  Orders: TLB-A1C / Hgb A1C (Glycohemoglobin) (83036-A1C)  Complete Medication List: 1)  Aspirin Ec 81 Mg Tbec (Aspirin) .... Take 1 tablet by mouth every morning 2)  Metformin Hcl 1000 Mg Tabs (Metformin hcl) .... Take 1 tablet by mouth twice a day 3)  Lisinopril 40 Mg Tabs (Lisinopril) .Marland Kitchen.. 1 once daily 4)  Amaryl 4 Mg Tabs (Glimepiride) .Marland Kitchen.. 1 once daily 5)  Hydrochlorothiazide 25 Mg Tabs (Hydrochlorothiazide) .... One daily in the morning  Small Instructions: 1)  Please schedule a follow-up appointment in 3 months. 2)  Limit your Sodium (Salt). 3)  It is important that you exercise regularly at least 20 minutes 5 times a week. If you develop chest pain, have severe difficulty breathing, or feel very tired , stop exercising immediately and seek medical attention. 4)  You need to lose weight. Consider a lower calorie diet and regular exercise.  5)  Check your blood sugars regularly. If your readings are usually above : or below 70 you should contact our office. 6)  It is important that your Diabetic A1c level is checked every 3 months. 7)  See your eye doctor yearly to check for diabetic eye damage. Prescriptions: HYDROCHLOROTHIAZIDE 25 MG TABS (HYDROCHLOROTHIAZIDE) one daily in the morning  #90 x 6    Entered and Authorized by:   Marletta Lor  MD   Signed by:   Marletta Lor  MD on 07/28/2009   Method used:   Electronically to        [facility] (mail-order)             ,          Ph: [phone]       Fax: [phone]   RxIDQB:[id number] AMARYL 4 MG TABS (GLIMEPIRIDE) 1 once daily  #90 x 6   Entered and Authorized by:   Marletta Lor  MD   Signed by:   Marletta Lor  MD on 07/28/2009   Method used:   Electronically to        [facility] (mail-order)             ,          Ph: [phone]       Fax: [phone]   RxIDPJ:[id number] LISINOPRIL 40 MG  TABS (LISINOPRIL) 1 once daily  #90 x 6   Entered and Authorized by:   Marletta Lor  MD   Signed by:   Marletta Lor  MD on 07/28/2009  Method used:   Electronically to        [facility] (mail-order)             ,          Ph: [phone]       Fax: [phone]   RxID:   [id number] METFORMIN HCL 1000 MG TABS (METFORMIN HCL) Take 1 tablet by mouth twice a day  #180 x 6   Entered and Authorized by:   Marletta Lor  MD   Signed by:   Marletta Lor  MD on 07/28/2009   Method used:   Electronically to        [facility] (mail-order)             ,          Ph: [phone]       Fax: [phone]   RxIDWY:[id number]

## 2010-12-11 NOTE — Assessment & Plan Note (Signed)
Summary: FUP/CCMM   Vital Signs:  Patient Profile:   77 Years Old Male Weight:      226 pounds Temp:     97.8 degrees F oral BP sitting:   150 / 64  (left arm)  Vitals Entered By: Chipper Oman, RN (August 11, 2007 8:04 AM)                 Chief Complaint:  ROV. Random BS @home  130.[name] Small  History of Present Illness: 77 year old male seen today for follow-up of his hypertension type 2 diabetes BPH  Current Allergies: AMOXICILLIN (AMOXICILLIN) LEVAQUIN (LEVOFLOXACIN)  Past Surgical History:    Reviewed history from 05/21/2007 and no changes required:       Appendectomy       Vasectomy       Penile prosthesis   Family History:        father died 81mother died age 53, diabetes    one of the aching siblings 6 brothers positive for cardiac disease.  Brain tumor, renal failure    Review of Systems  The patient denies anorexia, fever, weight loss, vision loss, decreased hearing, hoarseness, chest pain, syncope, dyspnea on exhertion, peripheral edema, prolonged cough, hemoptysis, abdominal pain, melena, hematochezia, severe indigestion/heartburn, hematuria, incontinence, genital sores, muscle weakness, suspicious skin lesions, transient blindness, difficulty walking, depression, unusual weight change, abnormal bleeding, enlarged lymph nodes, angioedema, breast masses, and testicular masses.         blood sugarsn the morning have been under nice control nonfasting today 130   Physical Exam  General:     overweight-appearing.   Head:     Normocephalic and atraumatic without obvious abnormalities. No apparent alopecia or balding. Eyes:     No corneal or conjunctival inflammation noted. EOMI. Perrla. Funduscopic exam benign, without hemorrhages, exudates or papilledema. Vision grossly normal. Mouth:     Oral mucosa and oropharynx without lesions or exudates.  Teeth in good repair. Neck:     No deformities, masses, or tenderness noted. Lungs:     Normal respiratory  effort, chest expands symmetrically. Lungs are clear to auscultation, no crackles or wheezes. Heart:     Normal rate and regular rhythm. S1 and S2 normal without gallop, murmur, click, rub or other extra sounds. Abdomen:     Bowel sounds positive,abdomen soft and non-tender without masses, organomegaly or hernias noted. Msk:     No deformity or scoliosis noted of thoracic or lumbar spine.   Extremities:     No clubbing, cyanosis, edema, or deformity noted with normal full range of motion of all joints.      Impression & Recommendations:  Problem # 1:  BENIGN PROSTATIC HYPERTROPHY (ICD-600.00)  Problem # 2:  DIABETES MELLITUS, TYPE II (ICD-250.00)  His updated medication list for this problem includes:    Actos 45 Mg Tabs (Pioglitazone hcl)    Amaryl 4 Mg Tabs (Glimepiride) .[name] Small... Take 1 tablet by mouth once a day    Aspirin Ec 81 Mg Tbec (Aspirin) .[name] Small... Take 1 tablet by mouth every morning    Lisinopril 40 Mg Tabs (Lisinopril) .[name] Small... 1 once daily    Metformin Hcl 1000 Mg Tabs (Metformin hcl) .[name] Small... Take 1 tablet by mouth twice a day    Actos 15 Mg Tabs (Pioglitazone hcl) .[name] Small... 1 once daily   Problem # 3:  HYPERTENSION (ICD-401.9)  His updated medication list for this problem includes:    Amlodipine Besylate 5 Mg Tabs (Amlodipine besylate) .[name] Small... Take 1 tablet by mouth once  a day    Lisinopril 40 Mg Tabs (Lisinopril) .[name] Small... 1 once daily    Hydrochlorothiazide 25 Mg Tabs (Hydrochlorothiazide) .[name] Small... 1 once daily   Complete Medication List: 1)  Actos 45 Mg Tabs (Pioglitazone hcl) 2)  Amaryl 4 Mg Tabs (Glimepiride) .... Take 1 tablet by mouth once a day 3)  Amlodipine Besylate 5 Mg Tabs (Amlodipine besylate) .... Take 1 tablet by mouth once a day 4)  Aspirin Ec 81 Mg Tbec (Aspirin) .... Take 1 tablet by mouth every morning 5)  Lisinopril 40 Mg Tabs (Lisinopril) .[name] Small.. 1 once daily 6)  Metformin Hcl 1000 Mg Tabs (Metformin hcl) .... Take 1 tablet by mouth twice a day 7)   Hydrochlorothiazide 25 Mg Tabs (Hydrochlorothiazide) .[name] Small.. 1 once daily 8)  Actos 15 Mg Tabs (Pioglitazone hcl) .[name] Small.. 1 once daily   Patient Instructions: 1)  Please schedule a follow-up appointment in 4 months. 2)  Advised not to eat any food or drink any liquids after 10 PM the night before your procedure. 3)  Limit your Sodium (Salt). 4)  It is important that you exercise regularly at least 20 minutes 5 times a week. If you develop chest pain, have severe difficulty breathing, or feel very tired , stop exercising immediately and seek medical attention. 5)  You need to lose weight. Consider a lower calorie diet and regular exercise.     ]

## 2010-12-11 NOTE — Letter (Signed)
Summary: [facility]  [facility]   Imported By: Phillis Knack 01/11/2010 11:45:11  _____________________________________________________________________  External Attachment:    Type:   Image     Comment:   External Document

## 2010-12-11 NOTE — Progress Notes (Signed)
Summary: vomiting/diarrhea  Phone Note Call from Patient   Summary of Call: Patient's wife calling to say that patient is vomiting, breaking out in a sweat and having diarrhea that just started today. Patient's wife wants to know if there is something she can do for him. Does he need to be seen or is it just a virus thats going around. [facility]. Wife can be reached at [phone]. Initial call taken by: Marcelino Freestone RMA,  May 01, 2009 1:06 PM  Follow-up for Phone Call        Phenergan 25 mg, number 21 every 6 hours as needed for nausea; clear liquid diet and office visit if any clinical worsening Follow-up by: Marletta Lor  MD,  May 01, 2009 4:56 PM      Appended Document: vomiting/diarrhea called.  Appended Document: vomiting/diarrhea Patient informed. Patient is feeling better and does not want the phenergan. Patient will call and make an appointment if he needs to.
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
## 2010-12-11 NOTE — Assessment & Plan Note (Signed)
Summary: PT WILL COME IN FASTING/NJR   Vital Signs:  Patient profile:   77 year old male Weight:      213 pounds Temp:     97.7 degrees F oral BP sitting:   138 / 66  (left arm) Cuff size:   regular  Vitals Entered By: Chipper Oman, RN (April 27, 2009 9:41 AM)  CC:  OV and fasting. FBS- 119.Marland Kitchen  History of Present Illness: in the 77 year old patient seen today for an extensive evaluation.  Medical problems include hypertension and type 2 diabetes.  He has BPH, history of diverticulosis and chronic kidney disease.  He also has a history of psoriasis.  His last hemoglobin A1c's have been 7.0 and 7.4, respectively.  A fasting blood sugar today 119  Hypertension History:      Positive major cardiovascular risk factors include male age 35 years old or older, diabetes, and hypertension.  Negative major cardiovascular risk factors include non-tobacco-user status.     Allergies: 1)  Amoxicillin (Amoxicillin) 2)  Levaquin (Levofloxacin)  Past History:  Past Medical History: Reviewed history from 12/04/2007 and no changes required. Diabetes mellitus, type II Hypertension Benign prostatic hypertrophy Diverticulosis, colon chronic kidney disease hospitalized in 2006 for left foot cellulitis  Past Surgical History: Appendectomy Vasectomy Penile prosthesis Stress test 2007 colonoscopy 5-7 years ago  Family History: Reviewed history from 08/11/2007 and no changes required.  father died 41 mother died age 43, diabetes one of the 45  siblings 59 brothers positive for cardiac disease.  Brain tumor, renal failure  Social History: Reviewed history from 12/04/2007 and no changes required. Married active with golf  Review of Systems  The patient denies anorexia, fever, weight loss, weight gain, vision loss, decreased hearing, hoarseness, chest pain, syncope, dyspnea on exertion, peripheral edema, prolonged cough, headaches, hemoptysis, abdominal pain, melena, hematochezia, severe  indigestion/heartburn, hematuria, incontinence, genital sores, muscle weakness, suspicious skin lesions, transient blindness, difficulty walking, depression, unusual weight change, abnormal bleeding, enlarged lymph nodes, angioedema, breast masses, and testicular masses.    Physical Exam  General:  Well-developed,well-nourished,in no acute distress; alert,appropriate and cooperative throughout examination Head:  Normocephalic and atraumatic without obvious abnormalities. No apparent alopecia or balding. Eyes:  No corneal or conjunctival inflammation noted. EOMI. Perrla. Funduscopic exam benign, without hemorrhages, exudates or papilledema. Vision grossly normal. Ears:  External ear exam shows no significant lesions or deformities.  Otoscopic examination reveals clear canals, tympanic membranes are intact bilaterally without bulging, retraction, inflammation or discharge. Hearing is grossly normal bilaterally. Mouth:  Oral mucosa and oropharynx without lesions or exudates.  Teeth in good repair. Neck:  No deformities, masses, or tenderness noted. Chest Wall:  No deformities, masses, tenderness or gynecomastia noted. Breasts:  No masses or gynecomastia noted Lungs:  Normal respiratory effort, chest expands symmetrically. Lungs are clear to auscultation, no crackles or wheezes. Heart:  Normal rate and regular rhythm. S1 and S2 normal without gallop, murmur, click, rub or other extra sounds. Abdomen:  Bowel sounds positive,abdomen soft and non-tender without masses, organomegaly or hernias noted. Rectal:  No external abnormalities noted. Normal sphincter tone. No rectal masses or tenderness. Genitalia:  penile prosthesis Prostate:  3+ enlarged.  3+ enlarged.   Msk:  No deformity or scoliosis noted of thoracic or lumbar spine.   Pulses:  dorsalis pedis pulses faint posterior tibial pulses intact Extremities:  No clubbing, cyanosis, edema, or deformity noted with normal full range of motion of all  joints.   Neurologic:  No cranial  nerve deficits noted. Station and gait are normal. Plantar reflexes are down-going bilaterally. DTRs are symmetrical throughout. Sensory, motor and coordinative functions appear intact. Skin:  deeply tanned Cervical Nodes:  No lymphadenopathy noted Axillary Nodes:  No palpable lymphadenopathy Inguinal Nodes:  No significant adenopathy Psych:  Cognition and judgment appear intact. Alert and cooperative with normal attention span and concentration. No apparent delusions, illusions, hallucinations  Diabetes Management Exam:    Foot Exam by Podiatrist:       Date: 02/27/2005       Results: no diabetic findings       Done by: Dr  Katy Fitch   Impression & Recommendations:  Problem # 1:  CHRONIC KIDNEY DISEASE UNSPECIFIED (ICD-585.9)  Orders: Venipuncture [id number]) TLB-Lipid Panel (80061-LIPID) TLB-BMP (Basic Metabolic Panel-BMET) (99991111) TLB-CBC Platelet - w/Differential (85025-CBCD) TLB-Hepatic/Liver Function Pnl (80076-HEPATIC) TLB-TSH (Thyroid Stimulating Hormone) (84443-TSH)  Problem # 2:  BENIGN PROSTATIC HYPERTROPHY (ICD-600.00)  Orders: DRE (G0102) Venipuncture [id number]) TLB-Lipid Panel (80061-LIPID) TLB-BMP (Basic Metabolic Panel-BMET) (99991111) TLB-CBC Platelet - w/Differential (85025-CBCD) TLB-Hepatic/Liver Function Pnl (80076-HEPATIC) TLB-TSH (Thyroid Stimulating Hormone) (84443-TSH) TLB-PSA (Prostate Specific Antigen) (84153-PSA) Prescription Created Electronically [phone])  Problem # 3:  HYPERTENSION (ICD-401.9)  His updated medication list for this problem includes:    Lisinopril 40 Mg Tabs (Lisinopril) .Marland Kitchen... 1 once daily    Hydrochlorothiazide 25 Mg Tabs (Hydrochlorothiazide) ..... One daily in the morning  Orders: EKG w/ Interpretation (93000) Venipuncture [id number]) TLB-Lipid Panel (80061-LIPID) TLB-BMP (Basic Metabolic Panel-BMET) (99991111) TLB-CBC Platelet - w/Differential (85025-CBCD) TLB-Hepatic/Liver  Function Pnl (80076-HEPATIC) TLB-TSH (Thyroid Stimulating Hormone) [id number]) Prescription Created Electronically ([phone])  His updated medication list for this problem includes:    Lisinopril 40 Mg Tabs (Lisinopril) .Marland Kitchen... 1 once daily    Hydrochlorothiazide 25 Mg Tabs (Hydrochlorothiazide) ..... One daily in the morning  Problem # 4:  DIABETES MELLITUS, TYPE II (ICD-250.00)  His updated medication list for this problem includes:    Aspirin Ec 81 Mg Tbec (Aspirin) .Marland Kitchen... Take 1 tablet by mouth every morning    Metformin Hcl 1000 Mg Tabs (Metformin hcl) .Marland Kitchen... Take 1 tablet by mouth twice a day    Lisinopril 40 Mg Tabs (Lisinopril) .Marland Kitchen... 1 once daily    Amaryl 4 Mg Tabs (Glimepiride) .Marland Kitchen... 1 once daily  His updated medication list for this problem includes:    Aspirin Ec 81 Mg Tbec (Aspirin) .Marland Kitchen... Take 1 tablet by mouth every morning    Metformin Hcl 1000 Mg Tabs (Metformin hcl) .Marland Kitchen... Take 1 tablet by mouth twice a day    Lisinopril 40 Mg Tabs (Lisinopril) .Marland Kitchen... 1 once daily    Amaryl 4 Mg Tabs (Glimepiride) .Marland Kitchen... 1 once daily  Orders: Venipuncture [id number]) TLB-Lipid Panel (80061-LIPID) TLB-BMP (Basic Metabolic Panel-BMET) (99991111) TLB-CBC Platelet - w/Differential (85025-CBCD) TLB-Hepatic/Liver Function Pnl (80076-HEPATIC) TLB-TSH (Thyroid Stimulating Hormone) (84443-TSH) TLB-A1C / Hgb A1C (Glycohemoglobin) (83036-A1C) Prescription Created Electronically [phone])  Complete Medication List: 1)  Amlodipine Besylate 10 Mg Tabs (amlodipine Besylate)  .... Take 1 tablet by mouth once a day 2)  Aspirin Ec 81 Mg Tbec (Aspirin) .... Take 1 tablet by mouth every morning 3)  Metformin Hcl 1000 Mg Tabs (Metformin hcl) .... Take 1 tablet by mouth twice a day 4)  Lisinopril 40 Mg Tabs (Lisinopril) .Marland Kitchen.. 1 once daily 5)  Amaryl 4 Mg Tabs (Glimepiride) .Marland Kitchen.. 1 once daily 6)  Hydrochlorothiazide 25 Mg Tabs (Hydrochlorothiazide) .... One daily in the morning  Hypertension Assessment/Plan:       The patient's hypertensive risk group is category C: Target  organ damage and/or diabetes.  Today's blood pressure is 138/66.    Patient Instructions: 1)  Please schedule a follow-up appointment in 3 months. 2)  It is important that you exercise regularly at least 20 minutes 5 times a week. If you develop chest pain, have severe difficulty breathing, or feel very tired , stop exercising immediately and seek medical attention. 3)  Check your blood sugars regularly. If your readings are usually above : or below 70 you should contact our office. 4)  It is important that your Diabetic A1c level is checked every 3 months. Prescriptions: AMARYL 4 MG TABS (GLIMEPIRIDE) 1 once daily  #90 x 6   Entered and Authorized by:   Marletta Lor  MD   Signed by:   Marletta Lor  MD on 04/27/2009   Method used:   Electronically to        [facility] (mail-order)             ,          Ph: [phone]       Fax: [phone]   [id number] LISINOPRIL 40 MG  TABS (LISINOPRIL) 1 once daily  #90 x 6   Entered and Authorized by:   Marletta Lor  MD   Signed by:   Marletta Lor  MD on 04/27/2009   Method used:   Electronically to        [facility] (mail-order)             ,          Ph: [phone]       Fax: [phone]   [id number] METFORMIN HCL 1000 MG TABS (METFORMIN HCL) Take 1 tablet by mouth twice a day  #180 x 6   Entered and Authorized by:   Marletta Lor  MD   Signed by:   Marletta Lor  MD on 04/27/2009   Method used:   Electronically to        [facility] (mail-order)             ,          Ph: [phone]       Fax: [phone]   [id number] HYDROCHLOROTHIAZIDE 25 MG TABS (HYDROCHLOROTHIAZIDE) one daily in the morning  #90 x 6   Entered and Authorized by:   Marletta Lor  MD   Signed by:   Marletta Lor  MD on 04/27/2009   Method used:   Electronically to        [facility]  (mail-order)             ,          Ph: [phone]       Fax: [phone]   [id number] AMARYL 4 MG TABS (GLIMEPIRIDE) 1 once daily  #90 x 6   Entered and Authorized by:   Marletta Lor  MD   Signed by:   Marletta Lor  MD on 04/27/2009   Method used:   Electronically to        [facility] (mail-order)             ,          Ph: [phone]       Fax: [phone]   [id number] LISINOPRIL 40 MG  TABS (LISINOPRIL) 1 once daily  #90 x 6  Entered and Authorized by:   Marletta Lor  MD   Signed by:   Marletta Lor  MD on 04/27/2009   Method used:   Electronically to        [facility] (mail-order)             ,          Ph: [phone]       Fax: [phone]   [id number] METFORMIN HCL 1000 MG TABS (METFORMIN HCL) Take 1 tablet by mouth twice a day  #180 x 6   Entered and Authorized by:   Marletta Lor  MD   Signed by:   Marletta Lor  MD on 04/27/2009   Method used:   Electronically to        [facility] (mail-order)             ,          Ph: [phone]       Fax: [phone]   [id number] HYDROCHLOROTHIAZIDE 25 MG TABS (HYDROCHLOROTHIAZIDE) one daily in the morning  #90 x 6   Entered and Authorized by:   Marletta Lor  MD   Signed by:   Marletta Lor  MD on 04/27/2009   Method used:   Print then Give to Patient   RxID:   [id number] AMARYL 4 MG TABS (GLIMEPIRIDE) 1 once daily  #90 x 6   Entered and Authorized by:   Marletta Lor  MD   Signed by:   Marletta Lor  MD on 04/27/2009   Method used:   Print then Give to Patient   RxID:   [id number] LISINOPRIL 40 MG  TABS (LISINOPRIL) 1 once daily  #90 x 6   Entered and Authorized by:   Marletta Lor  MD   Signed by:   Marletta Lor  MD on 04/27/2009   Method used:   Print then Give to Patient   RxID:   [id number] METFORMIN HCL 1000 MG TABS (METFORMIN HCL) Take 1 tablet by  mouth twice a day  #180 x 6   Entered and Authorized by:   Marletta Lor  MD   Signed by:   Marletta Lor  MD on 04/27/2009   Method used:   Print then Give to Patient   RxID:   [id number] AMLODIPINE BESYLATE  10 MG TABS (AMLODIPINE BESYLATE) Take 1 tablet by mouth once a day  #90 x 6   Entered and Authorized by:   Marletta Lor  MD   Signed by:   Marletta Lor  MD on 04/27/2009   Method used:   Print then Give to Patient   RxID:   [id number]

## 2010-12-11 NOTE — Assessment & Plan Note (Signed)
Summary: [id number]   Vital Signs:  Patient Profile:   77 Years Old Male Weight:      218 pounds Temp:     97.6 degrees F oral BP sitting:   150 / 76  (left arm)  Vitals Entered By: Chipper Oman, RN (December 04, 2007 10:10 AM)                 Chief Complaint:  OV and had a banana. FBS 114. Is holding Lisinopril.Marland Kitchen  History of Present Illness: 77 year old patient seen today for an annual physical.  Laboratory studies were done early in the month revealed an elevated creatinine at 1.7.  His hospitalized in 2006 with acute renal failure, and potassium 2.0.  Metformin was held and he was temporarily on Lantus.  Presently, he is on metformin.  Laboratory screen also revealed an elevated potassium and his aspirin and then as a pill has been held.  He was seen earlier in the month for a upper respiratory tract infection, and this has resolved.  He does track blood pressures at home with nice results  Current Allergies: AMOXICILLIN (AMOXICILLIN) LEVAQUIN (LEVOFLOXACIN)  Past Medical History:    Diabetes mellitus, type II    Hypertension    Benign prostatic hypertrophy    Diverticulosis, colon    chronic kidney disease    hospitalized in 2006 for left foot cellulitis   Social History:    Married    Review of Systems  The patient denies anorexia, fever, weight loss, vision loss, decreased hearing, hoarseness, chest pain, syncope, dyspnea on exhertion, peripheral edema, prolonged cough, hemoptysis, abdominal pain, melena, hematochezia, severe indigestion/heartburn, hematuria, incontinence, genital sores, muscle weakness, suspicious skin lesions, transient blindness, difficulty walking, depression, unusual weight change, abnormal bleeding, enlarged lymph nodes, angioedema, breast masses, and testicular masses.     Physical Exam  General:     Well-developed,well-nourished,in no acute distress; alert,appropriate and cooperative throughout examination  blood pressure 140/76 Head:   Normocephalic and atraumatic without obvious abnormalities. No apparent alopecia or balding. Eyes:     No corneal or conjunctival inflammation noted. EOMI. Perrla. Funduscopic exam benign, without hemorrhages, exudates or papilledema. Vision grossly normal. Ears:     External ear exam shows no significant lesions or deformities.  Otoscopic examination reveals clear canals, tympanic membranes are intact bilaterally without bulging, retraction, inflammation or discharge. Hearing is grossly normal bilaterally. Mouth:     Oral mucosa and oropharynx without lesions or exudates.  Teeth in good repair. Neck:     No deformities, masses, or tenderness noted. Chest Wall:     No deformities, masses, tenderness or gynecomastia noted. Breasts:     No masses or gynecomastia noted Lungs:     Normal respiratory effort, chest expands symmetrically. Lungs are clear to auscultation, no crackles or wheezes. Heart:     Normal rate and regular rhythm. S1 and S2 normal without gallop, murmur, click, rub or other extra sounds. Abdomen:     Bowel sounds positive,abdomen soft and non-tender without masses, organomegaly or hernias noted. Rectal:     No external abnormalities noted. Normal sphincter tone. No rectal masses or tenderness. stool hematest negative Genitalia:     penile prosthesis, and Prostate:     plus two.  Enlarged Msk:     No deformity or scoliosis noted of thoracic or lumbar spine.   Pulses:     posterior tibial pulses were intact.  Dorsalis pedis pulse is not easily palpable Extremities:     No clubbing, cyanosis,  edema, or deformity noted with normal full range of motion of all joints.   Neurologic:     No cranial nerve deficits noted. Station and gait are normal. Plantar reflexes are down-going bilaterally. DTRs are symmetrical throughout. Sensory, motor and coordinative functions appear intact. Skin:     Intact without suspicious lesions or rashes Cervical Nodes:     No  lymphadenopathy noted Axillary Nodes:     No palpable lymphadenopathy Inguinal Nodes:     No significant adenopathy Psych:     Cognition and judgment appear intact. Alert and cooperative with normal attention span and concentration. No apparent delusions, illusions, hallucinations    Impression & Recommendations:  Problem # 1:  DIVERTICULOSIS, COLON (ICD-562.10)  Problem # 2:  HYPERTENSION (ICD-401.9)  The following medications were removed from the medication list:    Lisinopril 40 Mg Tabs (Lisinopril) .Marland Kitchen... 1 once daily  His updated medication list for this problem includes:    Amlodipine Besylate 5 Mg Tabs (Amlodipine besylate) .Marland Kitchen... Take 1 tablet by mouth once a day    Hydrochlorothiazide 25 Mg Tabs (Hydrochlorothiazide) .Marland Kitchen... 1 once daily  Orders: EKG w/ Interpretation (93000)   Problem # 3:  DIABETES MELLITUS, TYPE II (ICD-250.00)  The following medications were removed from the medication list:    Lisinopril 40 Mg Tabs (Lisinopril) .Marland Kitchen... 1 once daily    Actos 15 Mg Tabs (Pioglitazone hcl) .Marland Kitchen... 1 once daily  His updated medication list for this problem includes:    Amaryl 4 Mg Tabs (Glimepiride) .Marland Kitchen... Take 1 tablet by mouth once a day    Aspirin Ec 81 Mg Tbec (Aspirin) .Marland Kitchen... Take 1 tablet by mouth every morning    Metformin Hcl 1000 Mg Tabs (Metformin hcl) .Marland Kitchen... Take 1 tablet by mouth twice a day    Actos 45 Mg Tabs (Pioglitazone hcl) .Marland Kitchen... 1 once daily   Problem # 4:  CHRONIC KIDNEY DISEASE UNSPECIFIED (ICD-585.9)  Orders: Venipuncture [id number]) TLB-BMP (Basic Metabolic Panel-BMET) (99991111)   Complete Medication List: 1)  Amaryl 4 Mg Tabs (Glimepiride) .... Take 1 tablet by mouth once a day 2)  Amlodipine Besylate 5 Mg Tabs (Amlodipine besylate) .... Take 1 tablet by mouth once a day 3)  Aspirin Ec 81 Mg Tbec (Aspirin) .... Take 1 tablet by mouth every morning 4)  Metformin Hcl 1000 Mg Tabs (Metformin hcl) .... Take 1 tablet by mouth twice a day 5)   Hydrochlorothiazide 25 Mg Tabs (Hydrochlorothiazide) .Marland Kitchen.. 1 once daily 6)  Hydrocodone-homatropine 5-1.5 Mg/80ml Syrp (Hydrocodone-homatropine) .... One or two tsp every 6 hrs as needed 7)  Doxycycline Hyclate 100 Mg Caps (Doxycycline hyclate) .... One twice daily 8)  Actos 45 Mg Tabs (Pioglitazone hcl) .Marland Kitchen.. 1 once daily  Other Orders: DRE [id number])   Patient Instructions: 1)  Please schedule a follow-up appointment in 2 months. 2)  Limit your Sodium (Salt) to less than 2 grams a day(slightly less than 1/2 a teaspoon) to prevent fluid retention, swelling, or worsening of symptoms. 3)  It is important that you exercise regularly at least 20 minutes 5 times a week. If you develop chest pain, have severe difficulty breathing, or feel very tired , stop exercising immediately and seek medical attention. 4)  Check your blood sugars regularly. If your readings are usually above : or below 70 you should contact our office. 5)  It is important that your Diabetic A1c level is checked every 3 months.    Prescriptions: ACTOS 45 MG  TABS (PIOGLITAZONE HCL) 1 once  daily  #90 x 6   Entered and Authorized by:   Marletta Lor  MD   Signed by:   Marletta Lor  MD on 12/04/2007   Method used:   Print then Give to Patient   RxID:   [id number] HYDROCHLOROTHIAZIDE 25 MG  TABS (HYDROCHLOROTHIAZIDE) 1 once daily  #90 x 6   Entered and Authorized by:   Marletta Lor  MD   Signed by:   Marletta Lor  MD on 12/04/2007   Method used:   Print then Give to Patient   RxID:   [id number] METFORMIN HCL 1000 MG TABS (METFORMIN HCL) Take 1 tablet by mouth twice a day  #180 x 6   Entered and Authorized by:   Marletta Lor  MD   Signed by:   Marletta Lor  MD on 12/04/2007   Method used:   Print then Give to Patient   RxID:   [id number] AMLODIPINE BESYLATE 5 MG TABS (AMLODIPINE BESYLATE) Take 1 tablet by mouth once a day  #90 x 6   Entered and Authorized by:   Marletta Lor  MD   Signed by:   Marletta Lor  MD on 12/04/2007   Method used:   Print then Give to Patient   RxID:   [id number] AMARYL 4 MG TABS (GLIMEPIRIDE) Take 1 tablet by mouth once a day  #90 x 6   Entered and Authorized by:   Marletta Lor  MD   Signed by:   Marletta Lor  MD on 12/04/2007   Method used:   Print then Give to Patient   RxID:   [id number]  ]

## 2010-12-11 NOTE — Progress Notes (Signed)
Summary: med expensive  Phone Note Call from Patient Call back at Home Phone [phone]   Caller: Spouse-doris Call For: [name] Lor  MD Summary of Call: pt wife would like something generic to  tradjenta due to cost [phone] call into [facility] please call wife back Initial call taken by: [name] Small,  July 31, 2010 9:32 AM  Follow-up for Phone Call        no substitue- resume [place] Follow-up by: [name] Lor  MD,  July 31, 2010 1:07 PM  Additional Follow-up for Phone Call Additional follow up Details #1::        attempt to call - ans mach - no subsitutue - can call out [place] to restart . Call and let me know if new rx for januvia to [place] Additional Follow-up by: Cay Schillings LPN,  September 20, 624THL 1:29 PM

## 2010-12-11 NOTE — Assessment & Plan Note (Signed)
Summary: 3 month rov/njr   Vital Signs:  Patient Profile:   77 Years Old Male Weight:      219 pounds Temp:     97.9 degrees F oral BP sitting:   170 / 78  (left arm) Cuff size:   regular  Vitals Entered By: Chipper Oman, RN (October 25, 2008 10:28 AM)                 Chief Complaint:  ROV and feels better off Actos. FBS 113.Marland Kitchen  History of Present Illness: 77 year old patient seen today for follow up of his hypertension and diabetes.  Last visit.  Actos was discontinued due to edema and he feels much improved.  Fasting blood sugars are doing.  The 110 to 130 range.  home systolic  blood pressure readings are generally in the 140 to 150 range.  Otherwise, done quite well.  No concerns or complaints    Current Allergies: AMOXICILLIN (AMOXICILLIN) LEVAQUIN (LEVOFLOXACIN)  Past Medical History:    Reviewed history from 12/04/2007 and no changes required:       Diabetes mellitus, type II       Hypertension       Benign prostatic hypertrophy       Diverticulosis, colon       chronic kidney disease       hospitalized in 2006 for left foot cellulitis     Review of Systems  The patient denies anorexia, fever, weight loss, weight gain, vision loss, decreased hearing, hoarseness, chest pain, syncope, dyspnea on exertion, peripheral edema, prolonged cough, headaches, hemoptysis, abdominal pain, melena, hematochezia, severe indigestion/heartburn, hematuria, incontinence, genital sores, muscle weakness, suspicious skin lesions, transient blindness, difficulty walking, depression, unusual weight change, abnormal bleeding, enlarged lymph nodes, angioedema, breast masses, and testicular masses.     Physical Exam  General:     Well-developed,well-nourished,in no acute distress; alert,appropriate and cooperative throughout examination; 160/76 Head:     Normocephalic and atraumatic without obvious abnormalities. No apparent alopecia or balding. Eyes:     No corneal or conjunctival  inflammation noted. EOMI. Perrla. Funduscopic exam benign, without hemorrhages, exudates or papilledema. Vision grossly normal. Mouth:     Oral mucosa and oropharynx without lesions or exudates.  Teeth in good repair. Neck:     No deformities, masses, or tenderness noted. Lungs:     Normal respiratory effort, chest expands symmetrically. Lungs are clear to auscultation, no crackles or wheezes. Heart:     Normal rate and regular rhythm. S1 and S2 normal without gallop, murmur, click, rub or other extra sounds. Abdomen:     Bowel sounds positive,abdomen soft and non-tender without masses, organomegaly or hernias noted.    Impression & Recommendations:  Problem # 1:  HYPERTENSION (ICD-401.9)  Problem # 2:  DIABETES MELLITUS, TYPE II (ICD-250.00)  His updated medication list for this problem includes:    Aspirin Ec 81 Mg Tbec (Aspirin) .Marland Kitchen... Take 1 tablet by mouth every morning    Metformin Hcl 1000 Mg Tabs (Metformin hcl) .Marland Kitchen... Take 1 tablet by mouth twice a day    Lisinopril 40 Mg Tabs (Lisinopril) .Marland Kitchen... 1 once daily    Amaryl 4 Mg Tabs (Glimepiride) .Marland Kitchen... 1 once daily  Orders: Venipuncture [id number]) TLB-A1C / Hgb A1C (Glycohemoglobin) (83036-A1C)   Complete Medication List: 1)  Amlodipine Besylate 10 Mg Tabs (amlodipine Besylate)  .... Take 1 tablet by mouth once a day 2)  Aspirin Ec 81 Mg Tbec (Aspirin) .... Take 1 tablet by mouth every morning  3)  Metformin Hcl 1000 Mg Tabs (Metformin hcl) .... Take 1 tablet by mouth twice a day 4)  Hydrocodone-homatropine 5-1.5 Mg/33ml Syrp (Hydrocodone-homatropine) .... One or two tsp every 6 hrs as needed 5)  Lisinopril 40 Mg Tabs (Lisinopril) .Marland Kitchen.. 1 once daily 6)  Amaryl 4 Mg Tabs (Glimepiride) .Marland Kitchen.. 1 once daily  Other Orders: Dermatology Referral (Derma)   Patient Instructions: 1)  Please schedule a follow-up appointment in 3 months. 2)  Limit your Sodium (Salt). 3)  Limit your Sodium (Salt) to less than 2 grams a day(slightly less  than 1/2 a teaspoon) to prevent fluid retention, swelling, or worsening of symptoms. 4)  Check your blood sugars regularly. If your readings are usually above :  150/85  or below 70 you should contact our office. 5)  It is important that your Diabetic A1c level is checked every 3 months. 6)  See your eye doctor yearly to check for diabetic eye damage.   Prescriptions: AMARYL 4 MG TABS (GLIMEPIRIDE) 1 once daily  #90 x 6   Entered and Authorized by:   Marletta Lor  MD   Signed by:   Marletta Lor  MD on 10/25/2008   Method used:   Print then Give to Patient   RxID:   [id number] LISINOPRIL 40 MG  TABS (LISINOPRIL) 1 once daily  #90 x 6   Entered and Authorized by:   Marletta Lor  MD   Signed by:   Marletta Lor  MD on 10/25/2008   Method used:   Print then Give to Patient   RxID:   [id number] METFORMIN HCL 1000 MG TABS (METFORMIN HCL) Take 1 tablet by mouth twice a day  #180 x 6   Entered and Authorized by:   Marletta Lor  MD   Signed by:   Marletta Lor  MD on 10/25/2008   Method used:   Print then Give to Patient   RxID:   [id number] AMLODIPINE BESYLATE  10 MG TABS (AMLODIPINE BESYLATE) Take 1 tablet by mouth once a day  #90 x 6   Entered and Authorized by:   Marletta Lor  MD   Signed by:   Marletta Lor  MD on 10/25/2008   Method used:   Print then Give to Patient   [id number]  ]

## 2010-12-11 NOTE — Letter (Signed)
Summary: [facility]   Imported By: Laural Benes 08/06/2010 15:11:03  _____________________________________________________________________  External Attachment:    Type:   Image     Comment:   External Document

## 2010-12-11 NOTE — Assessment & Plan Note (Signed)
Summary: 3 month rov/njr  no old and a     a  Vital Signs:  Patient profile:   77 year old male Weight:      222 pounds Temp:     97.6 degrees F oral Pulse rate:   78 / minute Pulse rhythm:   regular BP sitting:   166 / 80  (left arm) Cuff size:   regular  Vitals Entered By: Chipper Oman, RN (January 24, 2009 9:33 AM) CC: 3 mo ROV, BS 120., Hypertension Management Is Patient Diabetic? Yes   History of Present Illness: 77 year old patient seen today for follow-up of his type 2 diabetes.  Since his last visit here.  He has had an eye exam and a dermatologic exam.  He has hypertension, mild, chronic kidney disease, and history of treated hypertension.  His only complaint is some pedal edema.  He is on amlodipine 10 mg daily.  His last creatinine was 1.4.  No cardiopulmonary complaints.  He does exercise regularly.  His last hemoglobin A1c7.0  Hypertension History:      Positive major cardiovascular risk factors include male age 99 years old or older, diabetes, and hypertension.  Negative major cardiovascular risk factors include non-tobacco-user status.      Allergies: 1)  Amoxicillin (Amoxicillin) 2)  Levaquin (Levofloxacin)  Past History:  Past Medical History:    Diabetes mellitus, type II    Hypertension    Benign prostatic hypertrophy    Diverticulosis, colon    chronic kidney disease    hospitalized in 2006 for left foot cellulitis (12/04/2007)  Family History:    father died 23mother died age 20, diabetes    one of the aching siblings 6 brothers positive for cardiac disease.  Brain tumor, renal failure (08/11/2007)  Family History:    Reviewed history from 08/11/2007 and no changes required:              father died 17mother died age 16, diabetes       one of the aching siblings 6 brothers positive for cardiac disease.  Brain tumor, renal failure  Review of Systems       The patient complains of peripheral edema.  The patient denies anorexia, fever, weight  loss, weight gain, vision loss, decreased hearing, hoarseness, chest pain, syncope, dyspnea on exertion, prolonged cough, headaches, hemoptysis, abdominal pain, melena, hematochezia, severe indigestion/heartburn, hematuria, incontinence, genital sores, muscle weakness, suspicious skin lesions, transient blindness, difficulty walking, depression, unusual weight change, abnormal bleeding, enlarged lymph nodes, angioedema, breast masses, and testicular masses.    Physical Exam  General:  repeat blood pressure 150/70 Head:  Normocephalic and atraumatic without obvious abnormalities. No apparent alopecia or balding. Eyes:  No corneal or conjunctival inflammation noted. EOMI. Perrla. Funduscopic exam benign, without hemorrhages, exudates or papilledema. Vision grossly normal. Ears:  hearing aids  in place; mild cerumen Mouth:  Oral mucosa and oropharynx without lesions or exudates.  Teeth in good repair. Neck:  No deformities, masses, or tenderness noted. Chest Wall:  No deformities, masses, tenderness or gynecomastia noted. Lungs:  Normal respiratory effort, chest expands symmetrically. Lungs are clear to auscultation, no crackles or wheezes. Heart:  Normal rate and regular rhythm. S1 and S2 normal without gallop, murmur, click, rub or other extra sounds. Abdomen:  Bowel sounds positive,abdomen soft and non-tender without masses, organomegaly or hernias noted. Msk:  No deformity or scoliosis noted of thoracic or lumbar spine.   Extremities:  1+ left pedal edema and 1+ right pedal edema.  Impression & Recommendations:  Problem # 1:  HYPERTENSION (ICD-401.9)  His updated medication list for this problem includes:    Lisinopril 40 Mg Tabs (Lisinopril) .Marland Kitchen... 1 once daily    Hydrochlorothiazide 25 Mg Tabs (Hydrochlorothiazide) ..... One daily in the morning will add hydrochlorothiazide 25 mg daily for better blood pressure control and help with the edema  Problem # 2:  DIABETES MELLITUS, TYPE II  (ICD-250.00)  His updated medication list for this problem includes:    Aspirin Ec 81 Mg Tbec (Aspirin) .Marland Kitchen... Take 1 tablet by mouth every morning    Metformin Hcl 1000 Mg Tabs (Metformin hcl) .Marland Kitchen... Take 1 tablet by mouth twice a day    Lisinopril 40 Mg Tabs (Lisinopril) .Marland Kitchen... 1 once daily    Amaryl 4 Mg Tabs (Glimepiride) .Marland Kitchen... 1 once daily will check a hemoglobin A1c  Orders: Venipuncture [id number]) TLB-A1C / Hgb A1C (Glycohemoglobin) (83036-A1C)  Problem # 3:  CHRONIC KIDNEY DISEASE UNSPECIFIED (ICD-585.9)  Complete Medication List: 1)  Amlodipine Besylate 10 Mg Tabs (amlodipine Besylate)  .... Take 1 tablet by mouth once a day 2)  Aspirin Ec 81 Mg Tbec (Aspirin) .... Take 1 tablet by mouth every morning 3)  Metformin Hcl 1000 Mg Tabs (Metformin hcl) .... Take 1 tablet by mouth twice a day 4)  Hydrocodone-homatropine 5-1.5 Mg/66ml Syrp (Hydrocodone-homatropine) .... One or two tsp every 6 hrs as needed 5)  Lisinopril 40 Mg Tabs (Lisinopril) .Marland Kitchen.. 1 once daily 6)  Amaryl 4 Mg Tabs (Glimepiride) .Marland Kitchen.. 1 once daily 7)  Hydrochlorothiazide 25 Mg Tabs (Hydrochlorothiazide) .... One daily in the morning  Hypertension Assessment/Plan:      The patient's hypertensive risk group is category C: Target organ damage and/or diabetes.  Today's blood pressure is 166/80.    Patient Instructions: 1)  Please schedule a follow-up appointment in 3 months. 2)  Limit your Sodium (Salt). 3)  It is important that you exercise regularly at least 20 minutes 5 times a week. If you develop chest pain, have severe difficulty breathing, or feel very tired , stop exercising immediately and seek medical attention. 4)  Check your blood sugars regularly. If your readings are usually above : or below 70 you should contact our office. 5)  It is important that your Diabetic A1c level is checked every 3 months. 6)  See your eye doctor yearly to check for diabetic eye damage. Prescriptions: HYDROCHLOROTHIAZIDE 25 MG TABS  (HYDROCHLOROTHIAZIDE) one daily in the morning  #90 x 6   Entered and Authorized by:   Marletta Lor  MD   Signed by:   Marletta Lor  MD on 01/24/2009   Method used:   Print then Give to Patient   RxID:   [id number] AMARYL 4 MG TABS (GLIMEPIRIDE) 1 once daily  #90 x 6   Entered and Authorized by:   Marletta Lor  MD   Signed by:   Marletta Lor  MD on 01/24/2009   Method used:   Print then Give to Patient   RxID:   [id number] LISINOPRIL 40 MG  TABS (LISINOPRIL) 1 once daily  #90 x 6   Entered and Authorized by:   Marletta Lor  MD   Signed by:   Marletta Lor  MD on 01/24/2009   Method used:   Print then Give to Patient   RxID:   [id number] HYDROCODONE-HOMATROPINE 5-1.5 MG/5ML  SYRP (HYDROCODONE-HOMATROPINE) one or two tsp every 6 hrs as needed  #6 oz x  0   Entered and Authorized by:   Marletta Lor  MD   Signed by:   Marletta Lor  MD on 01/24/2009   Method used:   Print then Give to Patient   RxID:   [id number] METFORMIN HCL 1000 MG TABS (METFORMIN HCL) Take 1 tablet by mouth twice a day  #180 x 6   Entered and Authorized by:   Marletta Lor  MD   Signed by:   Marletta Lor  MD on 01/24/2009   Method used:   Print then Give to Patient   RxID:   [id number] AMLODIPINE BESYLATE  10 MG TABS (AMLODIPINE BESYLATE) Take 1 tablet by mouth once a day  #90 x 6   Entered and Authorized by:   Marletta Lor  MD   Signed by:   Marletta Lor  MD on 01/24/2009   Method used:   Print then Give to Patient   RxID:   [id number]  if ad

## 2010-12-11 NOTE — Progress Notes (Signed)
Summary: refill lisinopril  Phone Note Refill Request Call back at Home Phone [phone] Message from:  spouse---live call  Refills Requested: Medication #1:  LISINOPRIL 40 MG  TABS 1 once daily send to [facility].  Initial call taken by: Despina Arias,  September 25, 2010 10:39 AM    Prescriptions: LISINOPRIL 40 MG  TABS (LISINOPRIL) 1 once daily  #90 x 3   Entered by:   Cay Schillings LPN   Authorized by:   Marletta Lor  MD   Signed by:   Cay Schillings LPN on [id number]   Method used:   Electronically to        [facility] (retail)             ,          Ph: [phone]       Fax: [phone]   [id number]

## 2010-12-11 NOTE — Letter (Signed)
Summary: [facility]  [facility]   Imported By: Phillis Knack 07/18/2010 11:36:34  _____________________________________________________________________  External Attachment:    Type:   Image     Comment:   External Document

## 2010-12-11 NOTE — Assessment & Plan Note (Signed)
Summary: 38month rov/njr [facility] PER PT/NJR   Vital Signs:  Patient Profile:   77 Years Old Male Weight:      224 pounds Temp:     98.2 degrees F oral BP sitting:   140 / 66  (left arm) Cuff size:   regular  Vitals Entered By: Chipper Oman, RN (August 08, 2008 10:06 AM)                Flu Vaccine Consent Questions     Do you have a history of severe allergic reactions to this vaccine? no    Any prior history of allergic reactions to egg and/or gelatin? no    Do you have a sensitivity to the preservative Thimersol? no    Do you have a past history of Guillan-Barre Syndrome? no    Do you currently have an acute febrile illness? no    Have you ever had a severe reaction to latex? no    Vaccine information given and explained to patient? yes    Are you currently pregnant? no    Lot Number:[id number]   Site Given  Left Deltoid IM   Chief Complaint:  ROV. FBS 99.Marland Kitchen  History of Present Illness: 77 year old patient seen today for follow-up of his hypertension and diabetes.  He is doing  well.  He has increased his Amaryl to 4 mg daily due to increasing blood sugar values.  His medication was discontinued last visit due to symptomatic hypoglycemia in the setting of diminished oral intake.  His only complaint today is worsening pedal edema.  He is on Actos, as well as amlodipine    Current Allergies: AMOXICILLIN (AMOXICILLIN) LEVAQUIN (LEVOFLOXACIN)  Past Medical History:    Reviewed history from 12/04/2007 and no changes required:       Diabetes mellitus, type II       Hypertension       Benign prostatic hypertrophy       Diverticulosis, colon       chronic kidney disease       hospitalized in 2006 for left foot cellulitis      Physical Exam  General:     Well-developed,well-nourished,in no acute distress; alert,appropriate and cooperative throughout examination;  132/70 Head:     Normocephalic and atraumatic without obvious abnormalities. No apparent alopecia or  balding. Eyes:     No corneal or conjunctival inflammation noted. EOMI. Perrla. Funduscopic exam benign, without hemorrhages, exudates or papilledema. Vision grossly normal. Mouth:     Oral mucosa and oropharynx without lesions or exudates.  Teeth in good repair. Neck:     No deformities, masses, or tenderness noted. Lungs:     Normal respiratory effort, chest expands symmetrically. Lungs are clear to auscultation, no crackles or wheezes. Heart:     Normal rate and regular rhythm. S1 and S2 normal without gallop, murmur, click, rub or other extra sounds. Abdomen:     Bowel sounds positive,abdomen soft and non-tender without masses, organomegaly or hernias noted. Msk:     No deformity or scoliosis noted of thoracic or lumbar spine.   Pulses:     R and L carotid,radial,femoral,dorsalis pedis and posterior tibial pulses are full and equal bilaterally Extremities:     No clubbing, cyanosis, edema, or deformity noted with normal full range of motion of all joints.      Impression & Recommendations:  Problem # 1:  HYPERTENSION (ICD-401.9)  His updated medication list for this problem includes:    Amlodipine Besylate 5  Mg Tabs (Amlodipine besylate) .Marland Kitchen... Take 1 tablet by mouth once a day    Lisinopril 40 Mg Tabs (Lisinopril) .Marland Kitchen... 1 once daily   Problem # 2:  DIABETES MELLITUS, TYPE II (ICD-250.00)  The following medications were removed from the medication list:    Actos 45 Mg Tabs (Pioglitazone hcl) .Marland Kitchen... 1 once daily  His updated medication list for this problem includes:    Aspirin Ec 81 Mg Tbec (Aspirin) .Marland Kitchen... Take 1 tablet by mouth every morning    Metformin Hcl 1000 Mg Tabs (Metformin hcl) .Marland Kitchen... Take 1 tablet by mouth twice a day    Lisinopril 40 Mg Tabs (Lisinopril) .Marland Kitchen... 1 once daily    Amaryl 4 Mg Tabs (Glimepiride) .Marland Kitchen... 1 once daily will give him a trial off Actos in view of his tight glycemic control, and pedal edema  Complete Medication List: 1)  Amlodipine Besylate  5 Mg Tabs (Amlodipine besylate) .... Take 1 tablet by mouth once a day 2)  Aspirin Ec 81 Mg Tbec (Aspirin) .... Take 1 tablet by mouth every morning 3)  Metformin Hcl 1000 Mg Tabs (Metformin hcl) .... Take 1 tablet by mouth twice a day 4)  Hydrocodone-homatropine 5-1.5 Mg/70ml Syrp (Hydrocodone-homatropine) .... One or two tsp every 6 hrs as needed 5)  Lisinopril 40 Mg Tabs (Lisinopril) .Marland Kitchen.. 1 once daily 6)  Amaryl 4 Mg Tabs (Glimepiride) .Marland Kitchen.. 1 once daily  Other Orders: Flu Vaccine 33yrs + [id number]) Administration Flu vaccine [id number])   Patient Instructions: 1)  Please schedule a follow-up appointment in 3 months. 2)  Limit your Sodium (Salt) to less than 2 grams a day(slightly less than 1/2 a teaspoon) to prevent fluid retention, swelling, or worsening of symptoms. 3)  It is important that you exercise regularly at least 20 minutes 5 times a week. If you develop chest pain, have severe difficulty breathing, or feel very tired , stop exercising immediately and seek medical attention. 4)  You need to lose weight. Consider a lower calorie diet and regular exercise.    ]

## 2010-12-11 NOTE — Assessment & Plan Note (Signed)
Summary: 3 mo rov/mm   Vital Signs:  Patient profile:   77 year old male Weight:      214 pounds Temp:     97.7 degrees F oral BP sitting:   154 / 82  (left arm) Cuff size:   regular  Vitals Entered By: Chipper Oman, RN (October 27, 2009 9:47 AM) CC: 3 mo ROV, FBS 180 Is Patient Diabetic? Yes   CC:  3 mo ROV and FBS 180.  History of Present Illness: a 77 year old patient who has a history of type 2 diabetes, hypertension, and chronic kidney disease.  He is followed by nephrology.  Due to his worsening renal insufficiency, metformin therapy has been discontinued, and Actos 15 mg daily substituted.  He has noted worsening glycemic control.  His last hemoglobin A1c7.0.  No concerns or complaints today  Allergies: 1)  Amoxicillin (Amoxicillin) 2)  Levaquin (Levofloxacin)  Past History:  Past Medical History: Reviewed history from 12/04/2007 and no changes required. Diabetes mellitus, type II Hypertension Benign prostatic hypertrophy Diverticulosis, colon chronic kidney disease hospitalized in 2006 for left foot cellulitis  Past Surgical History: Reviewed history from 04/27/2009 and no changes required. Appendectomy Vasectomy Penile prosthesis Stress test 2007 colonoscopy 5-7 years ago  Review of Systems  The patient denies anorexia, fever, weight loss, weight gain, vision loss, decreased hearing, hoarseness, chest pain, syncope, dyspnea on exertion, peripheral edema, prolonged cough, headaches, hemoptysis, abdominal pain, melena, hematochezia, severe indigestion/heartburn, hematuria, incontinence, genital sores, muscle weakness, suspicious skin lesions, transient blindness, difficulty walking, depression, unusual weight change, abnormal bleeding, enlarged lymph nodes, angioedema, breast masses, and testicular masses.    Physical Exam  General:  Well-developed,well-nourished,in no acute distress; alert,appropriate and cooperative throughout examination; blood pressure  repeat 130/74 Head:  Normocephalic and atraumatic without obvious abnormalities. No apparent alopecia or balding. Eyes:  No corneal or conjunctival inflammation noted. EOMI. Perrla. Funduscopic exam benign, without hemorrhages, exudates or papilledema. Vision grossly normal. Mouth:  Oral mucosa and oropharynx without lesions or exudates.  Teeth in good repair. Neck:  No deformities, masses, or tenderness noted. Lungs:  Normal respiratory effort, chest expands symmetrically. Lungs are clear to auscultation, no crackles or wheezes. Heart:  Normal rate and regular rhythm. S1 and S2 normal without gallop, murmur, click, rub or other extra sounds. Abdomen:  Bowel sounds positive,abdomen soft and non-tender without masses, organomegaly or hernias noted. Msk:  No deformity or scoliosis noted of thoracic or lumbar spine.     Impression & Recommendations:  Problem # 1:  CHRONIC KIDNEY DISEASE UNSPECIFIED (ICD-585.9)  Problem # 2:  HYPERTENSION (ICD-401.9)  His updated medication list for this problem includes:    Lisinopril 40 Mg Tabs (Lisinopril) .Marland Kitchen... 1 once daily    Hydrochlorothiazide 25 Mg Tabs (Hydrochlorothiazide) ..... One daily in the morning  His updated medication list for this problem includes:    Lisinopril 40 Mg Tabs (Lisinopril) .Marland Kitchen... 1 once daily    Hydrochlorothiazide 25 Mg Tabs (Hydrochlorothiazide) ..... One daily in the morning  Problem # 3:  DIABETES MELLITUS, TYPE II (ICD-250.00)  The following medications were removed from the medication list:    Actos 15 Mg Tabs (Pioglitazone hcl) .Marland Kitchen... 1 once daily His updated medication list for this problem includes:    Aspirin Ec 81 Mg Tbec (Aspirin) .Marland Kitchen... Take 1 tablet by mouth every morning    Lisinopril 40 Mg Tabs (Lisinopril) .Marland Kitchen... 1 once daily    Amaryl 4 Mg Tabs (Glimepiride) .Marland Kitchen... 1 once daily    Actos  45 Mg Tabs (Pioglitazone hcl) ..... One daily    The following medications were removed from the medication list:     Actos 15 Mg Tabs (Pioglitazone hcl) .Marland Kitchen... 1 once daily His updated medication list for this problem includes:    Aspirin Ec 81 Mg Tbec (Aspirin) .Marland Kitchen... Take 1 tablet by mouth every morning    Lisinopril 40 Mg Tabs (Lisinopril) .Marland Kitchen... 1 once daily    Amaryl 4 Mg Tabs (Glimepiride) .Marland Kitchen... 1 once daily    Actos 45 Mg Tabs (Pioglitazone hcl) ..... One daily  Orders: Venipuncture [id number]) TLB-A1C / Hgb A1C (Glycohemoglobin) (83036-A1C) Prescription Created Electronically [phone])  Complete Medication List: 1)  Aspirin Ec 81 Mg Tbec (Aspirin) .... Take 1 tablet by mouth every morning 2)  Lisinopril 40 Mg Tabs (Lisinopril) .Marland Kitchen.. 1 once daily 3)  Amaryl 4 Mg Tabs (Glimepiride) .Marland Kitchen.. 1 once daily 4)  Hydrochlorothiazide 25 Mg Tabs (Hydrochlorothiazide) .... One daily in the morning 5)  Vitamin D (ergocalciferol) 50000 Unit Caps (Ergocalciferol) .Marland Kitchen.. 1 q month 6)  Actos 45 Mg Tabs (Pioglitazone hcl) .... One daily  Patient Instructions: 1)  Please schedule a follow-up appointment in 3 months. 2)  Limit your Sodium (Salt) to less than 2 grams a day(slightly less than 1/2 a teaspoon) to prevent fluid retention, swelling, or worsening of symptoms. 3)  It is important that you exercise regularly at least 20 minutes 5 times a week. If you develop chest pain, have severe difficulty breathing, or feel very tired , stop exercising immediately and seek medical attention. 4)  Check your Blood Pressure regularly. If it is above: 140/90 you should make an appointment. Prescriptions: ACTOS 45 MG TABS (PIOGLITAZONE HCL) one daily  #90 x 6   Entered and Authorized by:   Marletta Lor  MD   Signed by:   Marletta Lor  MD on 10/27/2009   Method used:   Print then Give to Patient   RxID:   [id number] ACTOS 45 MG TABS (PIOGLITAZONE HCL) one daily  #90 x 6   Entered and Authorized by:   Marletta Lor  MD   Signed by:   Marletta Lor  MD on 10/27/2009   Method used:   Electronically to         [facility] (mail-order)             ,          Ph: [phone]       Fax: [phone]   [id number]

## 2010-12-11 NOTE — Miscellaneous (Signed)
  Clinical Lists Changes  Medications: Changed medication from LISINOPRIL 40 MG TABS (LISINOPRIL) to LISINOPRIL 40 MG TABS (LISINOPRIL) 1 once daily

## 2010-12-11 NOTE — Assessment & Plan Note (Signed)
Summary: 2 MONTH ROA/JLS   Vital Signs:  Patient Profile:   77 Years Old Male Weight:      224 pounds Temp:     97.6 degrees F oral BP sitting:   144 / 66  (left arm) Cuff size:   regular  Vitals Entered By: Chipper Oman, RN (February 03, 2008 9:59 AM)                 Chief Complaint:  ROV. FBS 105.Marland Kitchen  History of Present Illness: 77 year old patient.  Follow-up today of his diabetes and hypertension.  He is known well does monitor home blood sugars with nice results.  He has had a recent eye exam    Current Allergies: AMOXICILLIN (AMOXICILLIN) LEVAQUIN (LEVOFLOXACIN)  Past Medical History:    Reviewed history from 12/04/2007 and no changes required:       Diabetes mellitus, type II       Hypertension       Benign prostatic hypertrophy       Diverticulosis, colon       chronic kidney disease       hospitalized in 2006 for left foot cellulitis     Review of Systems  The patient denies anorexia, fever, weight loss, weight gain, vision loss, decreased hearing, hoarseness, chest pain, syncope, dyspnea on exhertion, peripheral edema, prolonged cough, hemoptysis, abdominal pain, melena, hematochezia, severe indigestion/heartburn, hematuria, incontinence, genital sores, muscle weakness, suspicious skin lesions, transient blindness, difficulty walking, depression, unusual weight change, abnormal bleeding, enlarged lymph nodes, angioedema, breast masses, and testicular masses.     Physical Exam  General:     Well-developed,well-nourished,in no acute distress; alert,appropriate and cooperative throughout examination Head:     Normocephalic and atraumatic without obvious abnormalities. No apparent alopecia or balding. Eyes:     No corneal or conjunctival inflammation noted. EOMI. Perrla. Funduscopic exam benign, without hemorrhages, exudates or papilledema. Vision grossly normal. Mouth:     Oral mucosa and oropharynx without lesions or exudates.  Teeth in good  repair. Neck:     No deformities, masses, or tenderness noted. Lungs:     Normal respiratory effort, chest expands symmetrically. Lungs are clear to auscultation, no crackles or wheezes. Heart:     Normal rate and regular rhythm. S1 and S2 normal without gallop, murmur, click, rub or other extra sounds.    Impression & Recommendations:  Problem # 1:  HYPERTENSION (ICD-401.9)  His updated medication list for this problem includes:    Amlodipine Besylate 5 Mg Tabs (Amlodipine besylate) .Marland Kitchen... Take 1 tablet by mouth once a day    Hydrochlorothiazide 25 Mg Tabs (Hydrochlorothiazide) .Marland Kitchen... 1 once daily   Problem # 2:  DIABETES MELLITUS, TYPE II (ICD-250.00)  His updated medication list for this problem includes:    Amaryl 4 Mg Tabs (Glimepiride) .Marland Kitchen... Take 1 tablet by mouth once a day    Aspirin Ec 81 Mg Tbec (Aspirin) .Marland Kitchen... Take 1 tablet by mouth every morning    Metformin Hcl 1000 Mg Tabs (Metformin hcl) .Marland Kitchen... Take 1 tablet by mouth twice a day    Actos 45 Mg Tabs (Pioglitazone hcl) .Marland Kitchen... 1 once daily   Complete Medication List: 1)  Amaryl 4 Mg Tabs (Glimepiride) .... Take 1 tablet by mouth once a day 2)  Amlodipine Besylate 5 Mg Tabs (Amlodipine besylate) .... Take 1 tablet by mouth once a day 3)  Aspirin Ec 81 Mg Tbec (Aspirin) .... Take 1 tablet by mouth every morning 4)  Metformin Hcl 1000  Mg Tabs (Metformin hcl) .... Take 1 tablet by mouth twice a day 5)  Hydrochlorothiazide 25 Mg Tabs (Hydrochlorothiazide) .Marland Kitchen.. 1 once daily 6)  Hydrocodone-homatropine 5-1.5 Mg/67ml Syrp (Hydrocodone-homatropine) .... One or two tsp every 6 hrs as needed 7)  Doxycycline Hyclate 100 Mg Caps (Doxycycline hyclate) .... One twice daily 8)  Actos 45 Mg Tabs (Pioglitazone hcl) .Marland Kitchen.. 1 once daily   Patient Instructions: 1)  Please schedule a follow-up appointment in 3 months. 2)  Limit your Sodium (Salt). 3)  It is important that you exercise regularly at least 20 minutes 5 times a week. If you  develop chest pain, have severe difficulty breathing, or feel very tired , stop exercising immediately and seek medical attention. 4)  Check your blood sugars regularly. If your readings are usually above : or below 70 you should contact our office. 5)  It is important that your Diabetic A1c level is checked every 3 months.    Prescriptions: ACTOS 45 MG  TABS (PIOGLITAZONE HCL) 1 once daily  #90 x 6   Entered and Authorized by:   Marletta Lor  MD   Signed by:   Marletta Lor  MD on 02/03/2008   Method used:   Print then Give to Patient   RxID:   [id number] HYDROCHLOROTHIAZIDE 25 MG  TABS (HYDROCHLOROTHIAZIDE) 1 once daily  #90 x 6   Entered and Authorized by:   Marletta Lor  MD   Signed by:   Marletta Lor  MD on 02/03/2008   Method used:   Print then Give to Patient   RxID:   [id number] METFORMIN HCL 1000 MG TABS (METFORMIN HCL) Take 1 tablet by mouth twice a day  #180 x 6   Entered and Authorized by:   Marletta Lor  MD   Signed by:   Marletta Lor  MD on 02/03/2008   Method used:   Print then Give to Patient   RxID:   [id number] AMLODIPINE BESYLATE 5 MG TABS (AMLODIPINE BESYLATE) Take 1 tablet by mouth once a day  #90 x 3   Entered and Authorized by:   Marletta Lor  MD   Signed by:   Marletta Lor  MD on 02/03/2008   Method used:   Print then Give to Patient   RxID:   [id number] AMARYL 4 MG TABS (GLIMEPIRIDE) Take 1 tablet by mouth once a day  #90 x 3   Entered and Authorized by:   Marletta Lor  MD   Signed by:   Marletta Lor  MD on 02/03/2008   Method used:   Print then Give to Patient   RxID:   [id number]  ]

## 2010-12-11 NOTE — Assessment & Plan Note (Signed)
Summary: hot/sweaty/bs ?elevated/njr   Vital Signs:  Patient Profile:   77 Years Old Male Weight:      220 pounds Temp:     97.9 degrees F oral BP sitting:   178 / 80  (left arm) Cuff size:   regular  Vitals Entered By: Chipper Oman, RN (July 01, 2008 9:12 AM)                 Chief Complaint:  Not feeling well all week, says sugars low at night- 60-90. FBS today 200. Feels sweaty, dizzy, and appetite not too good.Marland Kitchen  History of Present Illness: 77 year old patient history type 2 diabetes. over the past few weeks.  He has had the decreased oral intake with a diminished appetite.  He is experienced several minor hypoglycemic episodes requiring ingestion of extra calories, but sugars are consistently running 60 to 90.  He has had no blood sugar documented less than 60 but again has had frequent mild hypoglycemic symptoms    Current Allergies: AMOXICILLIN (AMOXICILLIN) LEVAQUIN (LEVOFLOXACIN)  Past Medical History:    Reviewed history from 12/04/2007 and no changes required:       Diabetes mellitus, type II       Hypertension       Benign prostatic hypertrophy       Diverticulosis, colon       chronic kidney disease       hospitalized in 2006 for left foot cellulitis      Physical Exam  General:     overweight-appearing.  130/80    Impression & Recommendations:  Problem # 1:  HYPERTENSION (ICD-401.9)  His updated medication list for this problem includes:    Amlodipine Besylate 5 Mg Tabs (Amlodipine besylate) .Marland Kitchen... Take 1 tablet by mouth once a day    Lisinopril 40 Mg Tabs (Lisinopril) .Marland Kitchen... 1 once daily   Problem # 2:  DIABETES MELLITUS, TYPE II (ICD-250.00)  The following medications were removed from the medication list:    Amaryl 4 Mg Tabs (Glimepiride) .Marland Kitchen... Take 1 tablet by mouth once a day  His updated medication list for this problem includes:    Aspirin Ec 81 Mg Tbec (Aspirin) .Marland Kitchen... Take 1 tablet by mouth every morning    Metformin Hcl 1000 Mg  Tabs (Metformin hcl) .Marland Kitchen... Take 1 tablet by mouth twice a day    Actos 45 Mg Tabs (Pioglitazone hcl) .Marland Kitchen... 1 once daily    Lisinopril 40 Mg Tabs (Lisinopril) .Marland Kitchen... 1 once daily   Complete Medication List: 1)  Amlodipine Besylate 5 Mg Tabs (Amlodipine besylate) .... Take 1 tablet by mouth once a day 2)  Aspirin Ec 81 Mg Tbec (Aspirin) .... Take 1 tablet by mouth every morning 3)  Metformin Hcl 1000 Mg Tabs (Metformin hcl) .... Take 1 tablet by mouth twice a day 4)  Hydrocodone-homatropine 5-1.5 Mg/31ml Syrp (Hydrocodone-homatropine) .... One or two tsp every 6 hrs as needed 5)  Actos 45 Mg Tabs (Pioglitazone hcl) .Marland Kitchen.. 1 once daily 6)  Lisinopril 40 Mg Tabs (Lisinopril) .Marland Kitchen.. 1 once daily   Patient Instructions: 1)  return as scheduled for follow-up of your diabetes; 2)  hold  glimepiride (Amaryl)   Prescriptions: LISINOPRIL 40 MG  TABS (LISINOPRIL) 1 once daily  #90 x 6   Entered and Authorized by:   Marletta Lor  MD   Signed by:   Marletta Lor  MD on 07/01/2008   Method used:   Print then Give to Patient  [id number] ACTOS 45 MG  TABS (PIOGLITAZONE HCL) 1 once daily  #90 x 6   Entered and Authorized by:   Marletta Lor  MD   Signed by:   Marletta Lor  MD on 07/01/2008   Method used:   Print then Give to Patient   RxID:   [id number] METFORMIN HCL 1000 MG TABS (METFORMIN HCL) Take 1 tablet by mouth twice a day  #180 x 6   Entered and Authorized by:   Marletta Lor  MD   Signed by:   Marletta Lor  MD on 07/01/2008   Method used:   Print then Give to Patient   RxID:   [id number] AMLODIPINE BESYLATE 5 MG TABS (AMLODIPINE BESYLATE) Take 1 tablet by mouth once a day  #90 x 3   Entered and Authorized by:   Marletta Lor  MD   Signed by:   Marletta Lor  MD on 07/01/2008   Method used:   Print then Give to Patient   RxID:   [id number]  ]

## 2010-12-11 NOTE — Progress Notes (Signed)
Summary: stop HCTZ  Phone Note Call from Patient Call back at Home Phone [phone] Call back at [phone]   Caller: Spouse Reason for Call: Talk to Nurse Summary of Call: Patient's spouse concerned about blood pressure.  Diastolic BP is less than 60.  Patient just started taking Januvia - and wife is concerned the bp is going too low.  Initial call taken by: Kennon Rounds,  January 31, 2010 11:02 AM  Follow-up for Phone Call        spoke with wife - bp's running 107/49  112/51   119/48   102/48   144/59 - no c/o weakness or dizziness, wife concerned about low #'s . I discussed that if they continue to run low (in the 40's) we need to see, any change in behavior, dizziness or hard to wake - to ER via 911. Will inform Dr. Burnice Logan .  Follow-up by: Cay Schillings LPN,  March 23, 624THL 11:55 AM  Additional Follow-up for Phone Call Additional follow up Details #1::        per Dr. Burnice Logan stop HCTZ - called wife - informed. KIK Additional Follow-up by: Cay Schillings LPN,  March 23, 624THL 12:32 PM    stop HCTZ

## 2010-12-11 NOTE — Letter (Signed)
Summary: [facility]   Imported By: Laural Benes 12/19/2009 11:29:26  _____________________________________________________________________  External Attachment:    Type:   Image     Comment:   External Document

## 2010-12-11 NOTE — Consult Note (Signed)
Summary: diabetic eye exam  diabetic eye exam   Imported By: Jamelle Haring 01/04/2008 15:28:13  _____________________________________________________________________  External Attachment:    Type:   Image     Comment:   diabetic eye exam

## 2010-12-11 NOTE — Assessment & Plan Note (Signed)
Summary: cold/mhf   Vital Signs:  Small Profile:   77 Years Old Male Weight:      219 pounds Temp:     98 degrees F oral BP sitting:   130 / 58  (left arm)  Vitals Entered By: Chipper Oman, RN (November 16, 2007 2:43 PM)                 Chief Complaint:  C/o headcold and cough x 2 weeks. Random BS 147.Marland Kitchen  History of Present Illness: [name] Small.  Two history of a cough, mainly nocturnal minimally productive  Current Allergies: AMOXICILLIN (AMOXICILLIN) LEVAQUIN (LEVOFLOXACIN)      Physical Exam  General:     Well-developed,well-nourished,in no acute distress; alert,appropriate and cooperative throughout examination Head:     Normocephalic and atraumatic without obvious abnormalities. No apparent alopecia or balding. Eyes:     No corneal or conjunctival inflammation noted. EOMI. Perrla. Funduscopic exam benign, without hemorrhages, exudates or papilledema. Vision grossly normal. Ears:     External ear exam shows no significant lesions or deformities.  Otoscopic examination reveals clear canals, tympanic membranes are intact bilaterally without bulging, retraction, inflammation or discharge. Hearing is grossly normal bilaterally. Mouth:     Oral mucosa and oropharynx without lesions or exudates.  Teeth in good repair. Neck:     No deformities, masses, or tenderness noted. Lungs:     crackles are present at the right base only Heart:     Normal rate and regular rhythm. S1 and S2 normal without gallop, murmur, click, rub or other extra sounds.    Impression & Recommendations:  Problem # 1:  BACTERIAL PNEUMONIA, RIGHT LOWER LOBE (ICD-482.9)  His updated medication list for this problem includes:    Doxycycline Hyclate 100 Mg Caps (Doxycycline hyclate) ..... One twice daily   Complete Medication List: 1)  Amaryl 4 Mg Tabs (Glimepiride) .... Take 1 tablet by mouth once a day 2)  Amlodipine Besylate 5 Mg Tabs (Amlodipine besylate) .... Take 1 tablet by  mouth once a day 3)  Aspirin Ec 81 Mg Tbec (Aspirin) .... Take 1 tablet by mouth every morning 4)  Lisinopril 40 Mg Tabs (Lisinopril) .Marland Kitchen.. 1 once daily 5)  Metformin Hcl 1000 Mg Tabs (Metformin hcl) .... Take 1 tablet by mouth twice a day 6)  Hydrochlorothiazide 25 Mg Tabs (Hydrochlorothiazide) .Marland Kitchen.. 1 once daily 7)  Actos 15 Mg Tabs (Pioglitazone hcl) .Marland Kitchen.. 1 once daily 8)  Hydrocodone-homatropine 5-1.5 Mg/36ml Syrp (Hydrocodone-homatropine) .... One or two tsp every 6 hrs as needed 9)  Doxycycline Hyclate 100 Mg Caps (Doxycycline hyclate) .... One twice daily   Small Instructions: 1)  Drink as much fluid as you can tolerate for the next few days 2)  return as scheduled for follow-up    Prescriptions: DOXYCYCLINE HYCLATE 100 MG  CAPS (DOXYCYCLINE HYCLATE) one twice daily  #20 x 0   Entered and Authorized by:   Marletta Lor  MD   Signed by:   Marletta Lor  MD on 11/16/2007   Method used:   Print then Give to Small   RxID:   [id number] Barry 5-1.5 MG/5ML  SYRP (HYDROCODONE-HOMATROPINE) one or two tsp every 6 hrs as needed  #6 oz x 0   Entered and Authorized by:   Marletta Lor  MD   Signed by:   Marletta Lor  MD on 11/16/2007   Method used:   Print then Give to Small   RxID:  [id number]  ] 

## 2010-12-11 NOTE — Letter (Signed)
Summary: [facility]  [facility]   Imported By: Laural Benes 01/06/2009 15:23:43  _____________________________________________________________________  External Attachment:    Type:   Image     Comment:   External Document

## 2010-12-11 NOTE — Consult Note (Signed)
Summary: [facility] Provider: This provider was preselected by the workflow.  Signature: The signature status of this document was preset by the workflow  Processed by [employer] Local Indexer Client @ Tuesday, October 03, 2009 2:11:00 PM using version:2010.1.2.11(2.4)   Manually Indexed By: [id number]  idlBatchDetail: [id number]   _____________________________________________________________________  External Attachment:    Type:   Image     Comment:   External Document

## 2010-12-11 NOTE — Assessment & Plan Note (Signed)
Summary: 3 MONTH ROV/NJR   Vital Signs:  Patient profile:   77 year old male Weight:      224 pounds Temp:     97.8 degrees F oral BP sitting:   128 / 70  (right arm) Cuff size:   regular  Vitals Entered By: Cay Schillings LPN (June 13, 624THL D34-534 AM) CC: 3 mos rov - doing well         fbs 140, Hypertension Management Is Patient Diabetic? Yes Did you bring your meter with you today? No   CC:  3 mos rov - doing well         fbs 140 and Hypertension Management.  History of Present Illness: 77 year old patient who is seen today for follow up.  He has chronic kidney disease, hypertension, and diabetes.  He is down quite well.  Today, except for some mild pedal edema.  Hydrochlorothiazide has been discontinued, and amlodipine substituted.  Except for the pedal edema.  He has done quite well.  Blood sugars have trended down and usually in the 120 to 140 range  Hypertension History:      Positive major cardiovascular risk factors include male age 36 years old or older, diabetes, and hypertension.  Negative major cardiovascular risk factors include non-tobacco-user status.        Positive history for target organ damage include renal insufficiency.     Allergies: 1)  Amoxicillin (Amoxicillin) 2)  Levaquin (Levofloxacin)  Past History:  Past Medical History: Reviewed history from 12/04/2007 and no changes required. Diabetes mellitus, type II Hypertension Benign prostatic hypertrophy Diverticulosis, colon chronic kidney disease hospitalized in 2006 for left foot cellulitis  Physical Exam  General:  Well-developed,well-nourished,in no acute distress; alert,appropriate and cooperative throughout examination Head:  Normocephalic and atraumatic without obvious abnormalities. No apparent alopecia or balding. Mouth:  Oral mucosa and oropharynx without lesions or exudates.  Teeth in good repair. Neck:  No deformities, masses, or tenderness noted. Lungs:  Normal respiratory effort,  chest expands symmetrically. Lungs are clear to auscultation, no crackles or wheezes. Heart:  Normal rate and regular rhythm. S1 and S2 normal without gallop, murmur, click, rub or other extra sounds. Abdomen:  Bowel sounds positive,abdomen soft and non-tender without masses, organomegaly or hernias noted. Extremities:  trace left pedal edema and trace right pedal edema.     Impression & Recommendations:  Problem # 1:  CHRONIC KIDNEY DISEASE UNSPECIFIED (ICD-585.9)  Problem # 2:  HYPERTENSION (ICD-401.9)  His updated medication list for this problem includes:    Lisinopril 40 Mg Tabs (Lisinopril) .Marland Kitchen... 1 once daily    Hydrochlorothiazide 25 Mg Tabs (Hydrochlorothiazide) ..... One daily in the morning    Amlodipine Besylate 5 Mg Tabs (Amlodipine besylate) ..... Qhs  His updated medication list for this problem includes:    Lisinopril 40 Mg Tabs (Lisinopril) .Marland Kitchen... 1 once daily    Hydrochlorothiazide 25 Mg Tabs (Hydrochlorothiazide) ..... One daily in the morning    Amlodipine Besylate 5 Mg Tabs (Amlodipine besylate) ..... Qhs  Problem # 3:  Preventive Health Care (ICD-V70.0)  Complete Medication List: 1)  Aspirin Ec 81 Mg Tbec (Aspirin) .... Take 1 tablet by mouth every morning 2)  Lisinopril 40 Mg Tabs (Lisinopril) .Marland Kitchen.. 1 once daily 3)  Amaryl 4 Mg Tabs (Glimepiride) .Marland Kitchen.. 1 once daily 4)  Hydrochlorothiazide 25 Mg Tabs (Hydrochlorothiazide) .... One daily in the morning 5)  Vitamin D (ergocalciferol) 50000 Unit Caps (Ergocalciferol) .Marland Kitchen.. 1 q month 6)  Actos 45 Mg Tabs (Pioglitazone  hcl) .... One daily 7)  Januvia 50 Mg Tabs (Sitagliptin phosphate) .... One daily 8)  Amlodipine Besylate 5 Mg Tabs (Amlodipine besylate) .... Qhs 9)  Allopurinol 100 Mg Tabs (Allopurinol) .... Qd  Other Orders: Venipuncture [id number]) TLB-A1C / Hgb A1C (Glycohemoglobin) (83036-A1C)  Hypertension Assessment/Plan:      The patient's hypertensive risk group is category C: Target organ damage and/or  diabetes.  Today's blood pressure is 128/70.    Patient Instructions: 1)  Please schedule a follow-up appointment in 3 months. 2)  Limit your Sodium (Salt) to less than 2 grams a day(slightly less than 1/2 a teaspoon) to prevent fluid retention, swelling, or worsening of symptoms. 3)  It is important that you exercise regularly at least 20 minutes 5 times a week. If you develop chest pain, have severe difficulty breathing, or feel very tired , stop exercising immediately and seek medical attention. 4)  Check your blood sugars regularly. If your readings are usually above : or below 70 you should contact our office. 5)  It is important that your Diabetic A1c level is checked every 3 months.  Appended Document: 3 MONTH ROV/NJR Addendum:  CKD (GFR 32)-585.3  stable HTN- 403.90-well controlled DM2-250.00 -stable; will check HghA1C

## 2010-12-11 NOTE — Assessment & Plan Note (Signed)
   Vital Signs:  Patient Profile:   77 Years Old Male Weight:      226 pounds BP supine:   148 / 78               History of Present Illness: 77 year old gentleman history type 2 diabetes, and hypertension in today complaining of worsening of pedal edema.  The chest is done one year ago that revealed a normal ejection fraction of 55%.  Denies any orthopnea, or other other symptoms of congestive heart failure        Physical Exam  Neck:     No deformities, masses, or tenderness noted. Lungs:     a few crackles, right base Heart:     Normal rate and regular rhythm. S1 and S2 normal without gallop, murmur, click, rub or other extra sounds. Abdomen:     Bowel sounds positive,abdomen soft and non-tender without masses, organomegaly or hernias noted. Extremities:     plus one to two edema    Impression & Recommendations:  Problem # 1:  pedal edema probably secondary to a combination of many factors.  He has been on Actos amlodipine diuretic therapy has been discontinued in the past is in contact with golf and other outdoor activities and perhaps the change in ambient temperature has been a factorhe does state that he maintains a restricted salt diet. will decreases, Actos 15 mg daily, and resume on HCTZ, 25 mg daily.  Will recheck as scheduled  Problem # 2:  HYPERTENSION (ICD-401.9)  Problem # 3:  DIABETES MELLITUS, TYPE II (ICD-250.00) last hemoglobin A1c7.1   Patient Instructions: 1)  return office visit as scheduled

## 2010-12-13 NOTE — Assessment & Plan Note (Signed)
Summary: 3 month follow up/cjr   Vital Signs:  Patient profile:   77 year old male Weight:      232 pounds Temp:     98.0 degrees F oral BP sitting:   130 / 68  (left arm) Cuff size:   regular  Vitals Entered By: Cay Schillings LPN (December 13, 624THL 9:49 AM) CC: 3 month rov---doing ok      bs-120 Is Patient Diabetic? Yes Did you bring your meter with you today? No   CC:  3 month rov---doing ok      bs-120.  History of Present Illness: 77 year old patient who is seen today for follow-up.  He has a history of type 2 diabetes, controlled on glipizide and Actos and Januvia.  His last hemoglobin A1c7.5.  Unfortunately, he has gained fair amount of weight over the past year.  He feels well today although complain of some exertional fatigue.  He has not been very religious going to the gym.  Denies any exertional shortness of breath or chest pain.  He is followed closely by renal medicine and is scheduled for follow-up next month.  He has treated hypertension, which has been well controlled.  his chronic kidney disease, has been stable  Allergies: 1)  Amoxicillin (Amoxicillin) 2)  Levaquin (Levofloxacin)  Past History:  Past Medical History: Reviewed history from 12/04/2007 and no changes required. Diabetes mellitus, type II Hypertension Benign prostatic hypertrophy Diverticulosis, colon chronic kidney disease hospitalized in 2006 for left foot cellulitis  Past Surgical History: Reviewed history from 04/27/2009 and no changes required. Appendectomy Vasectomy Penile prosthesis Stress test 2007 colonoscopy 5-7 years ago  Review of Systems       The patient complains of weight gain.  The patient denies anorexia, fever, weight loss, vision loss, decreased hearing, hoarseness, chest pain, syncope, dyspnea on exertion, peripheral edema, prolonged cough, headaches, hemoptysis, abdominal pain, melena, hematochezia, severe indigestion/heartburn, hematuria, incontinence, genital  sores, muscle weakness, suspicious skin lesions, transient blindness, difficulty walking, depression, unusual weight change, abnormal bleeding, enlarged lymph nodes, angioedema, breast masses, and testicular masses.    Physical Exam  General:  overweight-appearing.  130/70overweight-appearing.   Head:  Normocephalic and atraumatic without obvious abnormalities. No apparent alopecia or balding. Eyes:  No corneal or conjunctival inflammation noted. EOMI. Perrla. Funduscopic exam benign, without hemorrhages, exudates or papilledema. Vision grossly normal. Ears:  hearing aids in place Mouth:  Oral mucosa and oropharynx without lesions or exudates.  Teeth in good repair. Neck:  No deformities, masses, or tenderness noted. Lungs:  few crackles at the right base Heart:  Normal rate and regular rhythm. S1 and S2 normal without gallop, murmur, click, rub or other extra sounds. Abdomen:  Bowel sounds positive,abdomen soft and non-tender without masses, organomegaly or hernias noted. Msk:  No deformity or scoliosis noted of thoracic or lumbar spine.   Pulses:  R and L carotid,radial,femoral,dorsalis pedis and posterior tibial pulses are full and equal bilaterally Extremities:  No clubbing, cyanosis, edema, or deformity noted with normal full range of motion of all joints.   Skin:  Intact without suspicious lesions or rashes Cervical Nodes:  No lymphadenopathy noted  Diabetes Management Exam:    Eye Exam:       Eye Exam done here today          Results: normal   Impression & Recommendations:  Problem # 1:  CHRONIC KIDNEY DISEASE UNSPECIFIED (ICD-585.9)  Problem # 2:  HYPERTENSION (ICD-401.9)  His updated medication list for this problem includes:  Lisinopril 40 Mg Tabs (Lisinopril) .Marland Kitchen... 1 once daily    Amlodipine Besylate 5 Mg Tabs (Amlodipine besylate) ..... Qhs    Furosemide 40 Mg Tabs (Furosemide) ..... One daily, as needed for swelling  His updated medication list for this problem  includes:    Lisinopril 40 Mg Tabs (Lisinopril) .Marland Kitchen... 1 once daily    Amlodipine Besylate 5 Mg Tabs (Amlodipine besylate) ..... Qhs    Furosemide 40 Mg Tabs (Furosemide) ..... One daily, as needed for swelling  Problem # 3:  DIABETES MELLITUS, TYPE II (ICD-250.00)  The following medications were removed from the medication list:    Actos 15 Mg Tabs (Pioglitazone hcl) ..... One daily    Tradjenta 5 Mg Tabs (Linagliptin) ..... One daily His updated medication list for this problem includes:    Aspirin Ec 81 Mg Tbec (Aspirin) .Marland Kitchen... Take 1 tablet by mouth every morning    Lisinopril 40 Mg Tabs (Lisinopril) .Marland Kitchen... 1 once daily    Glipizide 10 Mg Xr24h-tab (Glipizide) ..... One every morning    Januvia 50 Mg Tabs (Sitagliptin phosphate) ..... One daily    Actos 45 Mg Tabs (Pioglitazone hcl) .Marland Kitchen... 1/2 tablet daily    The following medications were removed from the medication list:    Actos 15 Mg Tabs (Pioglitazone hcl) ..... One daily    Tradjenta 5 Mg Tabs (Linagliptin) ..... One daily His updated medication list for this problem includes:    Aspirin Ec 81 Mg Tbec (Aspirin) .Marland Kitchen... Take 1 tablet by mouth every morning    Lisinopril 40 Mg Tabs (Lisinopril) .Marland Kitchen... 1 once daily    Glipizide 10 Mg Xr24h-tab (Glipizide) ..... One every morning    Januvia 50 Mg Tabs (Sitagliptin phosphate) ..... One daily    Actos 45 Mg Tabs (Pioglitazone hcl) .Marland Kitchen... 1/2 tablet daily  Complete Medication List: 1)  Aspirin Ec 81 Mg Tbec (Aspirin) .... Take 1 tablet by mouth every morning 2)  Lisinopril 40 Mg Tabs (Lisinopril) .Marland Kitchen.. 1 once daily 3)  Amlodipine Besylate 5 Mg Tabs (Amlodipine besylate) .... Qhs 4)  Allopurinol 100 Mg Tabs (Allopurinol) .... Qd 5)  Glipizide 10 Mg Xr24h-tab (Glipizide) .... One every morning 6)  Furosemide 40 Mg Tabs (Furosemide) .... One daily, as needed for swelling 7)  Januvia 50 Mg Tabs (Sitagliptin phosphate) .... One daily 8)  Actos 45 Mg Tabs (Pioglitazone hcl) .... 1/2 tablet  daily  Other Orders: Venipuncture [id number]) TLB-A1C / Hgb A1C (Glycohemoglobin) (83036-A1C)  Patient Instructions: 1)  Please schedule a follow-up appointment in 3 months for CPX  2)  Advised not to eat any food or drink any liquids after 10 PM the night before your procedure. 3)  Limit your Sodium (Salt). 4)  It is important that you exercise regularly at least 20 minutes 5 times a week. If you develop chest pain, have severe difficulty breathing, or feel very tired , stop exercising immediately and seek medical attention. 5)  You need to lose weight. Consider a lower calorie diet and regular exercise.  6)  Check your blood sugars regularly. If your readings are usually above : or below 70 you should contact our office. 7)  It is important that your Diabetic A1c level is checked every 3 months. 8)  See your eye doctor yearly to check for diabetic eye damage. Prescriptions: ACTOS 45 MG TABS (PIOGLITAZONE HCL) 1/2 tablet daily  #90 x 6   Entered and Authorized by:   Marletta Lor  MD   Signed by:  Marletta Lor  MD on 10/23/2010   Method used:   Electronically to        [facility] (retail)             ,          Ph: [phone]       Fax: [phone]   RxID:   [id number] JANUVIA 50 MG TABS (SITAGLIPTIN PHOSPHATE) one daily  #90 x 6   Entered and Authorized by:   Marletta Lor  MD   Signed by:   Marletta Lor  MD on 10/23/2010   Method used:   Electronically to        [facility] (retail)             ,          Ph: [phone]       Fax: [phone]   [id number] FUROSEMIDE 40 MG TABS (FUROSEMIDE) one daily, as needed for swelling  #90 x 6   Entered and Authorized by:   Marletta Lor  MD   Signed by:   Marletta Lor  MD on 10/23/2010   Method used:   Electronically to        [facility] (retail)             ,          Ph: [phone]       Fax: [phone]   [id number] ACTOS 15 MG TABS  (PIOGLITAZONE HCL) one daily  #90 x 6   Entered and Authorized by:   Marletta Lor  MD   Signed by:   Marletta Lor  MD on 10/23/2010   Method used:   Electronically to        [facility] (retail)             ,          Ph: [phone]       Fax: [phone]   [id number] GLIPIZIDE 10 MG XR24H-TAB (GLIPIZIDE) one every morning  #90 x 6   Entered and Authorized by:   Marletta Lor  MD   Signed by:   Marletta Lor  MD on 10/23/2010   Method used:   Electronically to        [place] (retail)             ,          Ph: [phone]       Fax: [phone]   [id number] ALLOPURINOL 100 MG TABS (ALLOPURINOL) qd  #90 x 6   Entered and Authorized by:   Marletta Lor  MD   Signed by:   Marletta Lor  MD on 10/23/2010   Method used:   Electronically to        [place] [facility] (retail)             ,          Ph: [phone]       Fax: [phone]   [id number] AMLODIPINE BESYLATE 5 MG TABS (AMLODIPINE BESYLATE) qhs  #90 x 6   Entered and Authorized by:   Marletta Lor  MD   Signed by:   Marletta Lor  MD on 10/23/2010   Method used:   Electronically to        [facility] (retail)             ,  Ph: [phone]       Fax: [phone]   [id number]:[id number] LISINOPRIL 40 MG  TABS (LISINOPRIL) 1 once daily  #90 x 3   Entered and Authorized by:   Marletta Lor  MD   Signed by:   Marletta Lor  MD on 10/23/2010   Method used:   Electronically to        [facility] (retail)             ,          Ph: [phone]       Fax: [phone]   RxID:   [id number]    Orders Added: 1)  Est. Patient Level IV [id number] 2)  Venipuncture [id number] 3)  TLB-A1C / Hgb A1C (Glycohemoglobin) [id number]  Appended Document: Orders Update    Clinical Lists Changes  Orders: Added new Service order of Specimen Handling (99000) - Signed

## 2010-12-13 NOTE — Letter (Signed)
Summary: [facility]   Imported By: Laural Benes 11/28/2010 09:46:15  _____________________________________________________________________  External Attachment:    Type:   Image     Comment:   External Document

## 2010-12-13 NOTE — Progress Notes (Signed)
Summary: clarification on actos  Phone Note From Pharmacy   Caller: [place]* Summary of Call: called needed clarfication on actos - 15mg  and 45?   Initial call taken by: Cay Schillings LPN,  December 19, 624THL 12:47 PM  Follow-up for Phone Call        spoke with [facility] - 15mg  was removed for med list 10/2010 visit - pt in on 45mg  1/2 tab at this time. KIK Follow-up by: Cay Schillings LPN,  December 19, 624THL 12:48 PM

## 2010-12-14 NOTE — Letter (Signed)
Summary: MD order  MD order   Imported By: Jamelle Haring 07/26/2008 10:10:09  _____________________________________________________________________  External Attachment:    Type:   Image     Comment:   MD order

## 2011-01-10 ENCOUNTER — Encounter: Payer: Self-pay | Admitting: Internal Medicine

## 2011-01-11 ENCOUNTER — Ambulatory Visit (INDEPENDENT_AMBULATORY_CARE_PROVIDER_SITE_OTHER): Payer: Medicare Other | Admitting: Internal Medicine

## 2011-01-11 ENCOUNTER — Encounter: Payer: Self-pay | Admitting: Internal Medicine

## 2011-01-11 VITALS — BP 118/70 | Temp 98.0°F | Wt 223.0 lb

## 2011-01-11 DIAGNOSIS — J189 Pneumonia, unspecified organism: Secondary | ICD-10-CM

## 2011-01-11 MED ORDER — AZITHROMYCIN 250 MG PO TABS: ORAL_TABLET | ORAL | Status: AC

## 2011-01-11 MED ORDER — AMLODIPINE BESYLATE 2.5 MG PO TABS: 2.5000 mg | ORAL_TABLET | Freq: Every day | ORAL | Status: DC

## 2011-01-11 NOTE — Patient Instructions (Signed)
Get plenty of rest, Drink lots of  clear liquids, and use Tylenol  for fever and discomfort.    Robitussin DM for cough  Take your antibiotic as prescribed until ALL of it is gone, but stop if you develop a rash, swelling, or any side effects of the medication.  Contact our office as soon as possible if  there are side effects of the medication.

## 2011-01-11 NOTE — Progress Notes (Signed)
  Subjective:    Patient ID: [name] Small, male    DOB: 04/29/1928, 77 y.o.   MRN: [id number]  HPI   76 year old patient who has a history of type 2 diabetes for the past 7-10 days he has felt unwell with worsening productive cough the eyes any fever or chills. He has been unimproved over the past 7 to 10 days. More recently he is spending more time in bed due to his acute illness. Cough is productive of brown sputum. Denies any wheezing shortness of breath or chest pain.  He has type 2 diabetes and is scheduled for a physical later this month his last hemoglobin A1c increased to 8.4.  He has a history of hypertension controlled with the amlodipine 5 mg since his illness his blood pressure has been running low. There has been some concerns with the fluid retention    Review of Systems  Constitutional: Positive for activity change, appetite change and fatigue. Negative for fever and chills.  HENT: Negative for hearing loss, ear pain, congestion, sore throat, trouble swallowing, neck stiffness, dental problem, voice change and tinnitus.   Eyes: Negative for pain, discharge and visual disturbance.  Respiratory: Positive for cough. Negative for chest tightness, wheezing and stridor.   Cardiovascular: Positive for leg swelling. Negative for chest pain and palpitations.  Gastrointestinal: Negative for nausea, vomiting, abdominal pain, diarrhea, constipation, blood in stool and abdominal distention.  Genitourinary: Negative for urgency, hematuria, flank pain, discharge, difficulty urinating and genital sores.  Musculoskeletal: Negative for myalgias, back pain, joint swelling, arthralgias and gait problem.  Skin: Negative for rash.  Neurological: Negative for dizziness, syncope, speech difficulty, weakness, numbness and headaches.  Hematological: Negative for adenopathy. Does not bruise/bleed easily.  Psychiatric/Behavioral: Negative for behavioral problems and dysphoric mood. The patient is not  nervous/anxious.        Objective:   Physical Exam  Constitutional: He is oriented to person, place, and time. He appears well-developed.  HENT:  Head: Normocephalic.  Right Ear: External ear normal.  Left Ear: External ear normal.  Eyes: Conjunctivae and EOM are normal.  Neck: Normal range of motion.  Cardiovascular: Normal rate and normal heart sounds.   Pulmonary/Chest: He has rales.        Considerable rales involving his right lower lobe  Abdominal: Bowel sounds are normal.  Musculoskeletal: Normal range of motion. He exhibits no edema and no tenderness.  Neurological: He is alert and oriented to person, place, and time.  Psychiatric: He has a normal mood and affect. His behavior is normal.          Assessment & Plan:   probable right lower lobe pneumonia. We'll treat with azithromycin. He is scheduled for a physical in a couple weeks and reassess at that time. If there is any clinical deterioration we'll check a chest x-ray  His blood pressure will be reassessed in a few weeks. In view of some low blood pressure readings we'll decrease his amlodipine to 2.5 mg daily  Diabetes mellitus

## 2011-01-24 ENCOUNTER — Ambulatory Visit (INDEPENDENT_AMBULATORY_CARE_PROVIDER_SITE_OTHER): Payer: Medicare Other | Admitting: Internal Medicine

## 2011-01-24 ENCOUNTER — Encounter: Payer: Self-pay | Admitting: Internal Medicine

## 2011-01-24 DIAGNOSIS — E119 Type 2 diabetes mellitus without complications: Secondary | ICD-10-CM

## 2011-01-24 DIAGNOSIS — I10 Essential (primary) hypertension: Secondary | ICD-10-CM

## 2011-01-24 MED ORDER — TORSEMIDE 10 MG PO TABS: 10.0000 mg | ORAL_TABLET | Freq: Every day | ORAL | Status: DC

## 2011-01-24 NOTE — Progress Notes (Signed)
  Subjective:    Patient ID: [name] Small, male    DOB: 31-Jul-1928, 77 y.o.   MRN: [id number]  HPI   77 year old patient who has treated hypertension. Presently he is on amlodipine 2.5 mg daily torsemide 10 mg daily as well as lisinopril 40 mg daily. Yesterday he had several documented episodes of high blood tension which were symptomatic. He became weak and dizzy on a number of occasions with blood pressure readings as low as 84 systolic. Today he feels well.    Review of Systems  Constitutional: Positive for fatigue. Negative for fever, chills and appetite change.  HENT: Negative for hearing loss, ear pain, congestion, sore throat, trouble swallowing, neck stiffness, dental problem, voice change and tinnitus.   Eyes: Negative for pain, discharge and visual disturbance.  Respiratory: Negative for cough, chest tightness, wheezing and stridor.   Cardiovascular: Negative for chest pain, palpitations and leg swelling.  Gastrointestinal: Negative for nausea, vomiting, abdominal pain, diarrhea, constipation, blood in stool and abdominal distention.  Genitourinary: Negative for urgency, hematuria, flank pain, discharge, difficulty urinating and genital sores.  Musculoskeletal: Negative for myalgias, back pain, joint swelling, arthralgias and gait problem.  Skin: Negative for rash.  Neurological: Positive for light-headedness. Negative for dizziness, syncope, speech difficulty, weakness, numbness and headaches.  Hematological: Negative for adenopathy. Does not bruise/bleed easily.  Psychiatric/Behavioral: Negative for behavioral problems and dysphoric mood. The patient is not nervous/anxious.        Objective:   Physical Exam  Constitutional: He is oriented to person, place, and time. He appears well-developed.        Blood pressure 140/70 bilaterally. No orthostatic change  HENT:  Head: Normocephalic.  Right Ear: External ear normal.  Left Ear: External ear normal.  Eyes: Conjunctivae and  EOM are normal.  Neck: Normal range of motion.  Cardiovascular: Normal rate and normal heart sounds.   Pulmonary/Chest: Breath sounds normal.  Abdominal: Bowel sounds are normal.  Musculoskeletal: Normal range of motion. He exhibits no edema and no tenderness.  Neurological: He is alert and oriented to person, place, and time.  Psychiatric: He has a normal mood and affect. His behavior is normal.          Assessment & Plan:   orthostatic hypotension symptomatic. The patient's diuretic will be held unless there is pedal edema. His amlodipine will be placed on hold and he will be maintained on lisinopril. He will be seen in 2 weeks for followup. A hemoglobin A1c will be checked today  Diabetes mellitus. A hemoglobin A1c will be checked today

## 2011-01-24 NOTE — Patient Instructions (Signed)
Limit your sodium (Salt) intake  Please check your blood pressure on a regular basis.  If it is consistently greater than 150/90, please make an office appointment.   return office visit 2 weeks

## 2011-01-25 LAB — HEMOGLOBIN A1C: Hgb A1c MFr Bld: 9.1 % — ABNORMAL HIGH (ref 4.6–6.5)

## 2011-01-28 NOTE — Progress Notes (Signed)
Quick Note:  Pt and wife aware of lab and f/u plans in 3 mos ______

## 2011-02-04 ENCOUNTER — Encounter: Payer: Self-pay | Admitting: Internal Medicine

## 2011-02-05 ENCOUNTER — Ambulatory Visit (INDEPENDENT_AMBULATORY_CARE_PROVIDER_SITE_OTHER): Payer: Medicare Other | Admitting: Internal Medicine

## 2011-02-05 ENCOUNTER — Encounter: Payer: Self-pay | Admitting: Internal Medicine

## 2011-02-05 VITALS — BP 130/70 | HR 64 | Temp 97.5°F | Resp 14 | Ht 73.5 in | Wt 221.0 lb

## 2011-02-05 DIAGNOSIS — I10 Essential (primary) hypertension: Secondary | ICD-10-CM

## 2011-02-05 DIAGNOSIS — Z0000 Encounter for general adult medical examination without abnormal findings: Secondary | ICD-10-CM

## 2011-02-05 DIAGNOSIS — N189 Chronic kidney disease, unspecified: Secondary | ICD-10-CM

## 2011-02-05 DIAGNOSIS — E119 Type 2 diabetes mellitus without complications: Secondary | ICD-10-CM

## 2011-02-05 MED ORDER — TORSEMIDE 10 MG PO TABS: ORAL_TABLET | ORAL | Status: DC

## 2011-02-05 NOTE — Progress Notes (Signed)
Subjective:    Patient ID: [name] Small, male    DOB: 08-03-1928, 76 y.o.   MRN: [id number]  HPI   77 year old patient who is seen today for followup. He has a history of type 2 diabetes. One week ago his single A1c was up to 9.1. Since then he has had much improved glycemic control. His blood pressure medications have been adjusted due to symptomatic hypotension. This has been much improved but he still has occasional documented low blood pressure readings with some weakness.  Past Medical History  Diagnosis Date  . BENIGN PROSTATIC HYPERTROPHY 05/21/2007  . CHRONIC KIDNEY DISEASE UNSPECIFIED 12/04/2007  . DIABETES MELLITUS, TYPE II 05/21/2007  . DIVERTICULOSIS, COLON 05/21/2007  . HYPERTENSION 05/21/2007  . PSORIASIS, SCALP 10/25/2008   Past Surgical History  Procedure Date  . Appendectomy   . Vasectomy   . Penile prosthesis implant     reports that he quit smoking about 42 years ago. He does not have any smokeless tobacco history on file. His alcohol and drug histories not on file. family history includes Diabetes in his mother and Heart disease in his brothers. Allergies  Allergen Reactions  . Amoxicillin     REACTION: unspecified  . Levofloxacin     REACTION: unspecified       Patient profile: 77 year old male  Weight: 213 pounds  Temp: 97.7 degrees F oral  BP sitting: 138 / 66 (left arm)  Cuff size: regular  Vitals Entered By: Chipper Oman, RN (April 27, 2009 9:41 AM)  CC: OV and fasting. FBS- 119.Marland Kitchen  History of Present Illness:  in the 77 year old patient seen today for an extensive evaluation. Medical problems include hypertension and type 2 diabetes. He has BPH, history of diverticulosis and chronic kidney disease. He also has a history of psoriasis. His last hemoglobin A1c's have been 7.0 and 7.4, respectively. A fasting blood sugar today 119  Hypertension History:  Positive major cardiovascular risk factors include male age 75 years old or older, diabetes, and  hypertension. Negative major cardiovascular risk factors include non-tobacco-user status.  Allergies:  1) Amoxicillin (Amoxicillin)  2) Levaquin (Levofloxacin)  Past History:  Past Medical History:  Reviewed history from 12/04/2007 and no changes required.  Diabetes mellitus, type II  Hypertension  Benign prostatic hypertrophy  Diverticulosis, colon  chronic kidney disease  hospitalized in 2006 for left foot cellulitis  Past Surgical History:  Appendectomy  Vasectomy  Penile prosthesis  Stress test 2007  colonoscopy 5-7 years ago  Family History:  Reviewed history from 08/11/2007 and no changes required.  father died 73  mother died age 59, diabetes  one of the 33 siblings 66 brothers positive for cardiac disease. Brain tumor, renal failure  Social History:  Reviewed history from 12/04/2007 and no changes required.  Married  active with golf     Review of Systems  Constitutional: Negative for fever, chills, activity change, appetite change and fatigue.  HENT: Negative for hearing loss, ear pain, congestion, rhinorrhea, sneezing, mouth sores, trouble swallowing, neck pain, neck stiffness, dental problem, voice change, sinus pressure and tinnitus.   Eyes: Negative for photophobia, pain, redness and visual disturbance.  Respiratory: Negative for apnea, cough, choking, chest tightness, shortness of breath and wheezing.   Cardiovascular: Negative for chest pain, palpitations and leg swelling.  Gastrointestinal: Negative for nausea, vomiting, abdominal pain, diarrhea, constipation, blood in stool, abdominal distention, anal bleeding and rectal pain.  Genitourinary: Negative for dysuria, urgency, frequency, hematuria, flank pain, decreased  urine volume, discharge, penile swelling, scrotal swelling, difficulty urinating, genital sores and testicular pain.  Musculoskeletal: Negative for myalgias, back pain, joint swelling, arthralgias and gait problem.  Skin: Negative for color change,  rash and wound.  Neurological: Positive for weakness and light-headedness. Negative for dizziness, tremors, seizures, syncope, facial asymmetry, speech difficulty, numbness and headaches.  Hematological: Negative for adenopathy. Does not bruise/bleed easily.  Psychiatric/Behavioral: Negative for suicidal ideas, hallucinations, behavioral problems, confusion, sleep disturbance, self-injury, dysphoric mood, decreased concentration and agitation. The patient is not nervous/anxious.        Objective:   Physical Exam  Constitutional: He appears well-developed and well-nourished.  HENT:  Head: Normocephalic and atraumatic.  Right Ear: External ear normal.  Left Ear: External ear normal.  Nose: Nose normal.  Mouth/Throat: Oropharynx is clear and moist.  Eyes: Conjunctivae and EOM are normal. Pupils are equal, round, and reactive to light. No scleral icterus.  Neck: Normal range of motion. Neck supple. No JVD present. No thyromegaly present.  Cardiovascular: Regular rhythm, normal heart sounds and intact distal pulses.  Exam reveals no gallop and no friction rub.   No murmur heard.       Posterior tibia pulses are full dorsalis pedis pulses not easily palpable  Pulmonary/Chest: Effort normal and breath sounds normal. He exhibits no tenderness.  Abdominal: Soft. Bowel sounds are normal. He exhibits no distension and no mass. There is no tenderness.        Obese right lower quadrant scar  Genitourinary: Prostate normal. Guaiac negative stool.        Penile prosthesis  Musculoskeletal: Normal range of motion. He exhibits no edema and no tenderness.  Lymphadenopathy:    He has no cervical adenopathy.  Neurological: He is alert. He has normal reflexes. No cranial nerve deficit. Coordination normal.  Skin: Skin is warm and dry. No rash noted.  Psychiatric: He has a normal mood and affect. His behavior is normal.          Assessment & Plan:   hypertension. Patient still has some symptomatic  low blood pressure readings. We'll decrease his torsemide to when necessary only for swelling   Unremarkable annual exam for age   Diabetes mellitus. Seems to be improved although hemoglobin A1c one week ago was poorly controlled we'll recheck in 3 months

## 2011-02-05 NOTE — Patient Instructions (Signed)
Limit your sodium (Salt) intake    It is important that you exercise regularly, at least 20 minutes 3 to 4 times per week.  If you develop chest pain or shortness of breath seek  medical attention.   Please check your hemoglobin A1c every 3 months  Return in 3 months for follow-up

## 2011-02-26 ENCOUNTER — Ambulatory Visit (INDEPENDENT_AMBULATORY_CARE_PROVIDER_SITE_OTHER): Payer: Medicare Other | Admitting: Internal Medicine

## 2011-02-26 ENCOUNTER — Encounter: Payer: Self-pay | Admitting: Internal Medicine

## 2011-02-26 DIAGNOSIS — I10 Essential (primary) hypertension: Secondary | ICD-10-CM

## 2011-02-26 DIAGNOSIS — E119 Type 2 diabetes mellitus without complications: Secondary | ICD-10-CM

## 2011-02-26 MED ORDER — TORSEMIDE 10 MG PO TABS: ORAL_TABLET | ORAL | Status: DC

## 2011-02-26 NOTE — Progress Notes (Signed)
  Subjective:    Patient ID: [name] Small, male    DOB: 13-Jun-1928, 77 y.o.   MRN: [id number]  HPI and is an 77 year old patient who is seen today for followup of his hypertension. He has had several hypotensive readings and does describe some weakness and dizziness. He describes a poor energy level. There is been no syncope. Medical regimen includes lisinopril as well as torsemide 10 mg daily he was placed on the latter primarily for fluid retention medical regimen for diabetes includes Actos 45 mg daily he has had no fluid retention. He is scheduled for renal followup in 4 days    Review of Systems  Constitutional: Positive for fatigue. Negative for fever, chills and appetite change.  HENT: Negative for hearing loss, ear pain, congestion, sore throat, trouble swallowing, neck stiffness, dental problem, voice change and tinnitus.   Eyes: Negative for pain, discharge and visual disturbance.  Respiratory: Negative for cough, chest tightness, wheezing and stridor.   Cardiovascular: Negative for chest pain, palpitations and leg swelling.  Gastrointestinal: Negative for nausea, vomiting, abdominal pain, diarrhea, constipation, blood in stool and abdominal distention.  Genitourinary: Negative for urgency, hematuria, flank pain, discharge, difficulty urinating and genital sores.  Musculoskeletal: Negative for myalgias, back pain, joint swelling, arthralgias and gait problem.  Skin: Negative for rash.  Neurological: Positive for weakness and light-headedness. Negative for dizziness, syncope, speech difficulty, numbness and headaches.  Hematological: Negative for adenopathy. Does not bruise/bleed easily.  Psychiatric/Behavioral: Negative for behavioral problems and dysphoric mood. The patient is not nervous/anxious.        Objective:   Physical Exam  Constitutional: He is oriented to person, place, and time. He appears well-developed.       140/70  HENT:  Head: Normocephalic.  Right Ear:  External ear normal.  Left Ear: External ear normal.  Eyes: Conjunctivae and EOM are normal.  Neck: Normal range of motion.  Cardiovascular: Normal rate and normal heart sounds.   Pulmonary/Chest: Breath sounds normal.  Abdominal: Bowel sounds are normal.  Musculoskeletal: Normal range of motion. He exhibits no edema and no tenderness.  Neurological: He is alert and oriented to person, place, and time.  Psychiatric: He has a normal mood and affect. His behavior is normal.          Assessment & Plan:  Hypertension. Patient has episodic hypotension. We'll decrease his torsemide to a Monday Wednesday Friday regimen only and continue lisinopril. Will recheck in 6 weeks Diabetes stable

## 2011-02-26 NOTE — Progress Notes (Signed)
  Subjective:    Patient ID: [name] Small, male    DOB: [birth year], 77 y.o.   MRN: [id number]  HPI Wt Readings from Last 3 Encounters:  02/26/11 223 lb (101.152 kg)  02/05/11 221 lb (100.245 kg)  01/24/11 225 lb (102.059 kg)     Review of Systems     Objective:   Physical Exam        Assessment & Plan:

## 2011-02-26 NOTE — Patient Instructions (Signed)
Decreased torsemide to Monday Wednesday Friday only Limit your sodium (Salt) intake

## 2011-03-01 ENCOUNTER — Other Ambulatory Visit: Payer: Self-pay | Admitting: Nephrology

## 2011-03-01 ENCOUNTER — Ambulatory Visit
Admission: RE | Admit: 2011-03-01 | Discharge: 2011-03-01 | Disposition: A | Discharge status: Home / Self Care | Payer: Medicare Other | Source: Ambulatory Visit | Attending: Nephrology | Admitting: Nephrology

## 2011-03-01 DIAGNOSIS — R0609 Other forms of dyspnea: Secondary | ICD-10-CM

## 2011-03-01 DIAGNOSIS — R0600 Dyspnea, unspecified: Secondary | ICD-10-CM

## 2011-03-15 ENCOUNTER — Other Ambulatory Visit (HOSPITAL_COMMUNITY): Payer: Self-pay | Admitting: Radiology

## 2011-03-15 DIAGNOSIS — R0609 Other forms of dyspnea: Secondary | ICD-10-CM

## 2011-03-15 DIAGNOSIS — R0600 Dyspnea, unspecified: Secondary | ICD-10-CM

## 2011-03-18 ENCOUNTER — Ambulatory Visit (HOSPITAL_COMMUNITY): Payer: Medicare Other | Attending: Nephrology

## 2011-03-18 DIAGNOSIS — R0989 Other specified symptoms and signs involving the circulatory and respiratory systems: Secondary | ICD-10-CM | POA: Insufficient documentation

## 2011-03-18 DIAGNOSIS — R0600 Dyspnea, unspecified: Secondary | ICD-10-CM

## 2011-03-18 DIAGNOSIS — R0609 Other forms of dyspnea: Secondary | ICD-10-CM | POA: Insufficient documentation

## 2011-03-19 ENCOUNTER — Encounter (HOSPITAL_COMMUNITY): Payer: Self-pay | Admitting: Nephrology

## 2011-03-21 ENCOUNTER — Encounter: Payer: Self-pay | Admitting: Internal Medicine

## 2011-03-25 ENCOUNTER — Encounter: Payer: Self-pay | Admitting: Internal Medicine
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
## 2011-03-29 NOTE — Discharge Summary (Signed)
NAMEROM, ALVARDO NO.:  [id number]   MEDICAL RECORD NO.:  [id number]          PATIENT TYPE:  INP   LOCATION:  0474                         FACILITY:  [facility]   PHYSICIAN:  Drema Pry, D.O. [facility]   DATE OF BIRTH:  1927/11/22   DATE OF ADMISSION:  01/05/2005  DATE OF DISCHARGE:                                 DISCHARGE SUMMARY   DISCHARGE DIAGNOSES:  1.  Left foot cellulitis, possible osteomyelitis.  2.  Acute renal failure.  3.  Diabetes mellitus.  4.  Hypertension.   DISCHARGE MEDICATIONS:  1.  Lantus 15 units subcu once daily with Lantus pen.  2.  Lisinopril 20 mg one-half tablet once daily.  3.  Hydrochlorothiazide 25 mg one tablet daily.  4.  Amaryl one tablet twice daily before a.m. and p.m. meal.  5.  Levaquin 500 mg p.o. one tablet daily x6 weeks.  6.  Enteric-coated aspirin 81 mg once daily.   HOSPITAL COURSE:  The patient is a 77 year old white male who was admitted  on January 05, 2005 secondary to left foot pain. He had recently been to a  [place] trip to [place] and stepped on a nail x2 4 days prior to  admission. The patient had increased swelling and redness to his left lower  extremity and was admitted for IV antibiotics due to the fact that he was  diabetic and had poor diabetic control. An x-ray was obtained during his  hospitalization which showed possible changes with lucency in the medical  cuneiform; osteomyelitis could not be completely ruled out. An MRI of this  foot was ordered which showed significant edema around his left foot and  also showed enhancement of the navicular bone which may be consistent with  osteomyelitis.   #1 - LEFT FOOT CELLULITIS, POSSIBLE OSTEOMYELITIS. The patient will be  treated with Levaquin x6 weeks. The patient is to follow up with Dr.  Burnice Logan, his primary care doctor, and follow his response.   #2 - During his hospitalization the patient had a short episode of acute  renal failure where his  creatinine went from 1.5 to 2.0. This was reversed  with holding his lisinopril and IV hydration. UA was normal. Renal  ultrasound showed slightly enlarged kidneys, possibly consistent with  diabetic nephropathy.   #3 - DIABETES MELLITUS. The patient has a history of being on Actos and  glipizide before. With his renal failure, we changed his glipizide to Amaryl  which is less renally excreted. The patient was started on Lantus 10 units,  which was increased to 15 units, and he will be discharged with insulin  therapy.   #4 - HYPERTENSION. Remained stable during his hospitalization. May need  monitoring as an outpatient.   ADDITIONAL STUDIES:  An arterial Doppler ABI was performed. ABIs indicate  normal arterial flow bilaterally, no stenosis or occlusion, mild plaque  throughout.   FOLLOW-UP INSTRUCTIONS:  The patient is to have a repeat BMET in  approximately 1 week and follow up with his primary care doctor, Dr.  Burnice Logan, within 1 week.      RY/MEDQ  D:  01/08/2005  T:  01/08/2005  Job:  [id number]   cc:   Marletta Lor, M.D. [facility]

## 2011-03-29 NOTE — H&P (Signed)
[name] Small, [name] Small NO.:  [id number]   MEDICAL RECORD NO.:  [id number]          PATIENT TYPE:  EMS   LOCATION:  ED                           FACILITY:  [facility]   PHYSICIAN:  Drema Pry, D.O. [facility]   DATE OF BIRTH:  Oct 30, 1928   DATE OF ADMISSION:  01/05/2005  DATE OF DISCHARGE:                                HISTORY & PHYSICAL   CHIEF COMPLAINT:  Left foot pain, possible abscess.   HISTORY OF PRESENT ILLNESS:  The patient is a 77 year old white male with a  past medical history of uncontrolled diabetes, hypertension, who was  evaluated by Dr. Sherrlyn Hock at [facility] secondary to left  foot pain.  The patient states that he has been in [place] on a  [place] trip for the last one week.  He stepped on a nail x 2 on Tuesday  of this week and since that time he has had increased swelling and pain to  the left foot.  There has been some drainage and his CBG at the urgent care  was greater than 400.  The patient states that he has not followed up with  Dr. Burnice Logan for some time regarding his diabetes.  He did go to the [place]  in the past who changed his diabetic medications, stopped Actos due to  increased expense and was put on Glipizide.  The patient does not check his  blood sugars but states that he has had readings above 400 in the recent  past.   PAST MEDICAL HISTORY:  1.  Uncontrolled diabetes.  2.  Hypertension.  3.  BPH.   PAST SURGICAL HISTORY:  1.  Appendectomy at age 39.  2.  Tonsillectomy at age 5.  66.  History of penile implant.   SOCIAL HISTORY:  The patient has a remote history of tobacco abuse.  He quit  four years ago.  Alcohol use, approximately two drinks per week.  His  previous occupation was in the [employer] and he currently lives with  his second wife.   FAMILY HISTORY:  Mother history of diabetes, deceased at age 92.  Father  deceased at age 37, natural causes.  He has two sisters that are in good   health.   The patient states that his last tetanus shot was within the last five  years.   CURRENT MEDICATIONS:  1.  Glipizide 10 mg once a day.  2.  Hydrochlorothiazide 25 mg once a day.  3.  Lisinopril 20 mg once a day.   PHYSICAL EXAMINATION:  VITAL SIGNS:  Temperature is 97.5, pulse is 78  ,  respirations 16, blood pressure is 140/74.  GENERAL:  The patient is a pleasant 77 year old white male in no apparent  distress.  HEENT:  Pupils are equal and reactive to light bilaterally.  Extraocular  motility is intact.  LUNGS:  Clear to auscultation bilaterally.  CARDIOVASCULAR:  Regular rate and rhythm.  No significant murmurs, rubs or  gallops appreciated.  ABDOMEN:  Protuberant, nontender.  Positive bowel sounds.  No organomegaly.  EXTREMITIES:  The patient had left  foot swelling with erythema at the bottom  of his foot.  A small amount of drainage.  The patient had decreased PD  pulses bilaterally and decreased sensation to monofilament .   REVIEW OF SYSTEMS:  No fevers, chills, no HEENT symptoms,  no chest pain, no  shortness of breath, no nausea, vomiting, constipation, diarrhea.  The  patient does report some cramping sensation in his legs with exertion.  He  has had a Doppler examination of his lower extremities he states  approximately four years ago.   IMPRESSION:  1.  Left foot cellulitis, possible abscess.  2.  Uncontrolled diabetes.  3.  Hypertension.   RECOMMENDATIONS:  1.  We will admit the patient for evaluation and care.  2.  Start IV antibiotics, Rocephin 1 gram to cover gram positives and with      the patient being diabetic we will also put him on Cipro 400 mg IV      q.12h. to cover pseudomonas.  3.  We will x-ray the left foot to rule out possible early signs of      osteomyelitis or abscess.  4.  The patient's pain perception may not be reliable secondary to      peripheral neuropathy.  5.  I expressed to the patient he will most likely need insulin  therapy      short term.  We will place the patient on Humalog sliding scale and      continue his Glipizide for now.  We will adjust insulin and possibly add      Metformin depending on the results of his basic metabolic profile, renal      function.  We will check a hemoglobin A1c and a fasting lipid profile in      the morning.      RY/MEDQ  D:  01/06/2005  T:  01/06/2005  Job:  [id number]   cc:   Marletta Lor, M.D. [facility]

## 2011-04-04 ENCOUNTER — Telehealth: Payer: Self-pay | Admitting: Internal Medicine

## 2011-04-04 DIAGNOSIS — R931 Abnormal findings on diagnostic imaging of heart and coronary circulation: Secondary | ICD-10-CM

## 2011-04-04 NOTE — Telephone Encounter (Signed)
Okay to refer to cardiology; abnormal 2-D echocardiogram with wall motion abnormalities. Rule out ischemic heart disease

## 2011-04-04 NOTE — Telephone Encounter (Signed)
Triage vm-------patient saw Dr Posey Pronto recently, which an EKG was done. They discovered that he has had a heart attack. Wife is requesting a referral to see a cardiologist.

## 2011-04-04 NOTE — Telephone Encounter (Signed)
Order placed

## 2011-04-04 NOTE — Telephone Encounter (Signed)
Okay for a cardiology referral. Would gladly review the EKG here if patient desires and properly assess need for referral

## 2011-04-04 NOTE — Telephone Encounter (Signed)
Spoke with wife - this was a 2d echo that was done  Please review and let me know dx  For referral

## 2011-04-12 ENCOUNTER — Encounter: Payer: Self-pay | Admitting: Cardiology

## 2011-04-12 ENCOUNTER — Ambulatory Visit: Payer: Medicare Other | Admitting: Cardiovascular Disease

## 2011-04-12 ENCOUNTER — Ambulatory Visit (INDEPENDENT_AMBULATORY_CARE_PROVIDER_SITE_OTHER): Payer: Medicare Other | Admitting: Cardiology

## 2011-04-12 VITALS — BP 142/64 | HR 72 | Resp 18 | Ht 74.0 in | Wt 220.0 lb

## 2011-04-12 DIAGNOSIS — R0609 Other forms of dyspnea: Secondary | ICD-10-CM

## 2011-04-12 DIAGNOSIS — R0989 Other specified symptoms and signs involving the circulatory and respiratory systems: Secondary | ICD-10-CM

## 2011-04-12 DIAGNOSIS — E119 Type 2 diabetes mellitus without complications: Secondary | ICD-10-CM

## 2011-04-12 DIAGNOSIS — N189 Chronic kidney disease, unspecified: Secondary | ICD-10-CM

## 2011-04-12 DIAGNOSIS — I10 Essential (primary) hypertension: Secondary | ICD-10-CM

## 2011-04-12 DIAGNOSIS — R9389 Abnormal findings on diagnostic imaging of other specified body structures: Secondary | ICD-10-CM

## 2011-04-12 DIAGNOSIS — R0600 Dyspnea, unspecified: Secondary | ICD-10-CM

## 2011-04-12 NOTE — Patient Instructions (Signed)
Make sure to take your aspirin 81mg  daily.  Your physician has suggested that you might need a cardiac catheterization. Cardiac catheterization is used to diagnose and/or treat various heart conditions. Doctors may recommend this procedure for a number of different reasons. The most common reason is to evaluate chest pain. Chest pain can be a symptom of coronary artery disease (CAD), and cardiac catheterization can show whether plaque is narrowing or blocking your heart's arteries. This procedure is also used to evaluate the valves, as well as measure the blood flow and oxygen levels in different parts of your heart. For further information please visit [facility]. Please follow instruction sheet, as given.

## 2011-04-18 ENCOUNTER — Telehealth: Payer: Self-pay | Admitting: Cardiology

## 2011-04-18 NOTE — Telephone Encounter (Signed)
Per pt wife calling waiting  a call from dr. Verl Blalock

## 2011-04-18 NOTE — Telephone Encounter (Signed)
LMTCB Debbie Karmon Andis RN  

## 2011-04-19 ENCOUNTER — Telehealth: Payer: Self-pay | Admitting: *Deleted

## 2011-04-19 ENCOUNTER — Encounter: Payer: Self-pay | Admitting: *Deleted

## 2011-04-19 ENCOUNTER — Encounter: Payer: Self-pay | Admitting: Cardiology

## 2011-04-19 DIAGNOSIS — R0609 Other forms of dyspnea: Secondary | ICD-10-CM | POA: Insufficient documentation

## 2011-04-19 DIAGNOSIS — R0600 Dyspnea, unspecified: Secondary | ICD-10-CM | POA: Insufficient documentation

## 2011-04-19 NOTE — Telephone Encounter (Signed)
I spoke with pt wife today. Please see telephone note. Horton Chin RN

## 2011-04-19 NOTE — Telephone Encounter (Signed)
I spoke with pt's family about having a heart cath Wednesday 04/24/11 at 2:30 pm with Dr. Angelena Form.  She is aware pt will be admitted to [facility] hospital on 04/23/11.  He will stop taking his torsemide 24 hour prior to going to the hospital on Tuesday.  He will stop his lisinopril today per Dr. Verl Blalock.   Horton Chin RN

## 2011-04-19 NOTE — Progress Notes (Signed)
HPI [name] Small Is a delightful 77 year old male who presents with progressive dyspnea and extreme fatigue. This occurred in the last several months. He denies any classic angina.  His chronic kidney disease with a creatinine clearance of about 30 followed by Dr. Posey Pronto. I spoke to Dr. Posey Pronto today.  His potassium also tends to run on the high side. He is on high-dose lisinopril and low-dose Demadex.  Your echocardiogram shows an ejection fraction of 50-55% with mild LV dilatation. His has Mild left atrial enlargement. He has some mild septal and inferior basal hypokinesia.  He has a history of diabetes for a number of years as well as hypertension.  He is very active and has been able to do very little in the yard.  His electrocardiogram shows normal sinus rhythm normal EKG. Past Medical History  Diagnosis Date  . BENIGN PROSTATIC HYPERTROPHY 05/21/2007  . CHRONIC KIDNEY DISEASE UNSPECIFIED 12/04/2007  . DIABETES MELLITUS, TYPE II 05/21/2007  . DIVERTICULOSIS, COLON 05/21/2007  . HYPERTENSION 05/21/2007  . PSORIASIS, SCALP 10/25/2008    Past Surgical History  Procedure Date  . Appendectomy   . Vasectomy   . Penile prosthesis implant     Family History  Problem Relation Age of Onset  . Diabetes Mother   . Heart disease Brother   . Heart disease Brother   . Heart disease Brother   . Heart disease Brother   . Heart disease Brother   . Heart disease Brother     History   Social History  . Marital Status: Married    Spouse Name: N/A    Number of Children: N/A  . Years of Education: N/A   Occupational History  . Not on file.   Social History Main Topics  . Smoking status: Former Smoker    Quit date: 11/11/1968  . Smokeless tobacco: Not on file  . Alcohol Use: Not on file  . Drug Use: Not on file  . Sexually Active: Not on file   Other Topics Concern  . Not on file   Social History Narrative  . No narrative on file    Allergies  Allergen Reactions  . Amoxicillin      REACTION: unspecified  . Levofloxacin     REACTION: unspecified    Current Outpatient Prescriptions  Medication Sig Dispense Refill  . allopurinol (ZYLOPRIM) 100 MG tablet Take 100 mg by mouth daily.        Marland Kitchen aspirin 81 MG tablet Take 81 mg by mouth daily.        Marland Kitchen glipiZIDE (GLUCOTROL) 10 MG 24 hr tablet Take 10 mg by mouth daily.        Marland Kitchen lisinopril (PRINIVIL,ZESTRIL) 40 MG tablet Take 40 mg by mouth daily.        . pioglitazone (ACTOS) 45 MG tablet Take by mouth. 1/2 tablet daily       . sitaGLIPtan (JANUVIA) 50 MG tablet Take 50 mg by mouth daily.        . sodium polystyrene (SPS) 15 GM/60ML suspension Take 15 g by mouth once. Take 4oz every friday       . torsemide (DEMADEX) 10 MG tablet One every morning on Monday Wednesdays and Fridays only  30 tablet  11    ROS Negative other than HPI.   PE General Appearance: well developed, well nourished in no acute distress HEENT: symmetrical face, PERRLA, good dentition  Neck: no JVD, thyromegaly, or adenopathy, trachea midline Chest: symmetric without deformity Cardiac: PMI non-displaced, RRR,  normal S1, S2, no gallop or murmur Lung: clear to ausculation and percussion Vascular: all pulses full without bruits  Abdominal: nondistended, nontender, good bowel sounds, no HSM, no bruits Extremities: no cyanosis, clubbing or edema, no sign of DVT, no varicosities  Skin: normal color, no rashes Neuro: alert and oriented x 3, non-focal Pysch: normal affect Filed Vitals:   04/12/11 1614  BP: 142/64  Pulse: 72  Resp: 18  Height: 6\' 2"  (1.88 m)  Weight: 220 lb (99.791 kg)    EKG  Labs and Studies Reviewed.   Lab Results  Component Value Date   WBC 7.4 04/27/2009   HGB 14.1 04/27/2009   HCT 40.7 04/27/2009   MCV 96.1 04/27/2009   PLT 194.0 04/27/2009      Chemistry      Component Value Date/Time   NA 143 07/28/2009 1042   K 5.5* 07/28/2009 1042   CL 108 07/28/2009 1042   CO2 29 07/28/2009 1042   BUN 46* 07/28/2009 1042    CREATININE 2.1* 07/28/2009 1042      Component Value Date/Time   CALCIUM 9.0 07/28/2009 1042   ALKPHOS 44 04/27/2009 1012   AST 17 04/27/2009 1012   ALT 11 04/27/2009 1012   BILITOT 0.9 04/27/2009 1012       Lab Results  Component Value Date   CHOL 203* 04/27/2009   CHOL 192 11/25/2007   Lab Results  Component Value Date   HDL 33.30* 04/27/2009   No results found for this basename: [facility]   Lab Results  Component Value Date   TRIG 147.0 04/27/2009   Lab Results  Component Value Date   CHOLHDL 6 04/27/2009   Lab Results  Component Value Date   HGBA1C 9.1* 01/24/2011   Lab Results  Component Value Date   ALT 11 04/27/2009   AST 17 04/27/2009   ALKPHOS 44 04/27/2009   BILITOT 0.9 04/27/2009   Lab Results  Component Value Date   TSH 1.29 04/27/2009

## 2011-04-23 ENCOUNTER — Inpatient Hospital Stay (HOSPITAL_COMMUNITY)
Admission: AD | Admit: 2011-04-23 | Discharge: 2011-04-24 | DRG: 287 | Disposition: A | Discharge status: Home / Self Care | Payer: Medicare Other | Source: Ambulatory Visit | Attending: Cardiology | Admitting: Cardiology

## 2011-04-23 DIAGNOSIS — N189 Chronic kidney disease, unspecified: Secondary | ICD-10-CM | POA: Diagnosis present

## 2011-04-23 DIAGNOSIS — R0602 Shortness of breath: Secondary | ICD-10-CM

## 2011-04-23 DIAGNOSIS — Z7982 Long term (current) use of aspirin: Secondary | ICD-10-CM

## 2011-04-23 DIAGNOSIS — I129 Hypertensive chronic kidney disease with stage 1 through stage 4 chronic kidney disease, or unspecified chronic kidney disease: Secondary | ICD-10-CM | POA: Diagnosis present

## 2011-04-23 DIAGNOSIS — Z79899 Other long term (current) drug therapy: Secondary | ICD-10-CM

## 2011-04-23 DIAGNOSIS — N400 Enlarged prostate without lower urinary tract symptoms: Secondary | ICD-10-CM | POA: Diagnosis present

## 2011-04-23 DIAGNOSIS — E119 Type 2 diabetes mellitus without complications: Secondary | ICD-10-CM | POA: Diagnosis present

## 2011-04-23 DIAGNOSIS — I2510 Atherosclerotic heart disease of native coronary artery without angina pectoris: Principal | ICD-10-CM | POA: Diagnosis present

## 2011-04-23 LAB — CBC
HCT: 39.2 % (ref 39.0–52.0)
Hemoglobin: 13.4 g/dL (ref 13.0–17.0)
MCH: 33.7 pg (ref 26.0–34.0)
MCHC: 34.2 g/dL (ref 30.0–36.0)
MCV: 98.5 fL (ref 78.0–100.0)
Platelets: 187 10*3/uL (ref 150–400)
RBC: 3.98 MIL/uL — ABNORMAL LOW (ref 4.22–5.81)
RDW: 13.2 % (ref 11.5–15.5)
WBC: 7.1 10*3/uL (ref 4.0–10.5)

## 2011-04-23 LAB — PROTIME-INR
INR: 0.93 (ref 0.00–1.49)
Prothrombin Time: 12.7 seconds (ref 11.6–15.2)

## 2011-04-23 LAB — APTT: aPTT: 29 seconds (ref 24–37)

## 2011-04-23 LAB — BASIC METABOLIC PANEL
BUN: 33 mg/dL — ABNORMAL HIGH (ref 6–23)
CO2: 31 mEq/L (ref 19–32)
Calcium: 8.7 mg/dL (ref 8.4–10.5)
Chloride: 102 mEq/L (ref 96–112)
Creatinine, Ser: 2.05 mg/dL — ABNORMAL HIGH (ref 0.4–1.5)
GFR calc Af Amer: 38 mL/min — ABNORMAL LOW (ref >60)
GFR calc non Af Amer: 31 mL/min — ABNORMAL LOW (ref >60)
Glucose, Bld: 179 mg/dL — ABNORMAL HIGH (ref 70–99)
Potassium: 4.4 mEq/L (ref 3.5–5.1)
Sodium: 141 mEq/L (ref 135–145)

## 2011-04-23 LAB — GLUCOSE, CAPILLARY: Glucose-Capillary: 184 mg/dL — ABNORMAL HIGH (ref 70–99)

## 2011-04-24 LAB — POCT I-STAT 3, ART BLOOD GAS (G3+)
Acid-Base Excess: 3 mmol/L — ABNORMAL HIGH (ref 0.0–2.0)
Bicarbonate: 29.6 mEq/L — ABNORMAL HIGH (ref 20.0–24.0)
O2 Saturation: 95 %
TCO2: 31 mmol/L (ref 0–100)
pCO2 arterial: 55.6 mmHg — ABNORMAL HIGH (ref 35.0–45.0)
pH, Arterial: 7.334 — ABNORMAL LOW (ref 7.350–7.450)
pO2, Arterial: 82 mmHg (ref 80.0–100.0)

## 2011-04-24 LAB — GLUCOSE, CAPILLARY
Glucose-Capillary: 199 mg/dL — ABNORMAL HIGH (ref 70–99)
Glucose-Capillary: 93 mg/dL (ref 70–99)
Glucose-Capillary: 127 mg/dL — ABNORMAL HIGH (ref 70–99)
Glucose-Capillary: 108 mg/dL — ABNORMAL HIGH (ref 70–99)
Glucose-Capillary: 130 mg/dL — ABNORMAL HIGH (ref 70–99)
Glucose-Capillary: 180 mg/dL — ABNORMAL HIGH (ref 70–99)

## 2011-04-24 LAB — POCT I-STAT 3, VENOUS BLOOD GAS (G3P V)
Acid-Base Excess: 4 mmol/L — ABNORMAL HIGH (ref 0.0–2.0)
Bicarbonate: 31.8 mEq/L — ABNORMAL HIGH (ref 20.0–24.0)
O2 Saturation: 63 %
TCO2: 34 mmol/L (ref 0–100)
pCO2, Ven: 62.8 mmHg — ABNORMAL HIGH (ref 45.0–50.0)
pH, Ven: 7.313 — ABNORMAL HIGH (ref 7.250–7.300)
pO2, Ven: 37 mmHg (ref 30.0–45.0)

## 2011-04-24 LAB — BASIC METABOLIC PANEL
BUN: 29 mg/dL — ABNORMAL HIGH (ref 6–23)
CO2: 31 mEq/L (ref 19–32)
Calcium: 8.3 mg/dL — ABNORMAL LOW (ref 8.4–10.5)
Chloride: 105 mEq/L (ref 96–112)
Creatinine, Ser: 1.74 mg/dL — ABNORMAL HIGH (ref 0.4–1.5)
GFR calc Af Amer: 46 mL/min — ABNORMAL LOW (ref >60)
GFR calc non Af Amer: 38 mL/min — ABNORMAL LOW (ref >60)
Glucose, Bld: 95 mg/dL (ref 70–99)
Potassium: 4 mEq/L (ref 3.5–5.1)
Sodium: 142 mEq/L (ref 135–145)

## 2011-04-25 ENCOUNTER — Other Ambulatory Visit (INDEPENDENT_AMBULATORY_CARE_PROVIDER_SITE_OTHER): Payer: Medicare Other | Admitting: *Deleted

## 2011-04-25 DIAGNOSIS — N189 Chronic kidney disease, unspecified: Secondary | ICD-10-CM

## 2011-04-25 LAB — BASIC METABOLIC PANEL
BUN: 23 mg/dL (ref 6–23)
CO2: 33 mEq/L — ABNORMAL HIGH (ref 19–32)
Calcium: 8.5 mg/dL (ref 8.4–10.5)
Chloride: 100 mEq/L (ref 96–112)
Creatinine, Ser: 1.8 mg/dL — ABNORMAL HIGH (ref 0.4–1.5)
GFR: 38.98 mL/min — ABNORMAL LOW (ref >60.00)
Glucose, Bld: 104 mg/dL — ABNORMAL HIGH (ref 70–99)
Potassium: 4.7 mEq/L (ref 3.5–5.1)
Sodium: 141 mEq/L (ref 135–145)

## 2011-05-01 ENCOUNTER — Other Ambulatory Visit: Payer: Medicare Other

## 2011-05-08 ENCOUNTER — Ambulatory Visit: Payer: Medicare Other | Admitting: Internal Medicine

## 2011-05-08 ENCOUNTER — Encounter: Payer: Self-pay | Admitting: Physician Assistant

## 2011-05-08 NOTE — Cardiovascular Report (Signed)
[name] [name], TINDOL NO.:  [id number]  MEDICAL RECORD NO.:  [id number]  LOCATION:  2031                         FACILITY:  [facility]  PHYSICIAN:  [name] Sacramento, MD     DATE OF BIRTH:  1927/11/13  DATE OF PROCEDURE: DATE OF DISCHARGE:  04/24/2011                           CARDIAC CATHETERIZATION   PROCEDURES PERFORMED: 1. Right heart catheterization. 2. Left heart catheterization. 3. Coronary angiography.  No left ventricular angiography was     performed due to chronic kidney disease.  REFERRING PHYSICIAN:  Marijo [name]. [name] Blalock, MD, [facility]  PRIMARY NEPHROLOGIST:  Dr. Posey [name].  PRIMARY CARE PHYSICIAN:  [name] Lor, MD  INDICATION AND CLINICAL HISTORY:  This is an 77 year old gentleman with history of type 2 diabetes as well as chronic kidney disease.  He presented with progressive symptoms of exertional dyspnea over the last 6 months with decline in his functional capacity.  He had an echocardiogram done which showed an ejection fraction of 50-55% with mild septal and inferior basal hypokinesis.  Due to his symptoms and echocardiogram findings, cardiac catheterization with limited contrast was recommended.  Risks, benefits and alternatives were discussed with the patient.  I especially discussed with him the issue of possible contrast induced nephropathy.  The patient was admitted overnight for hydration.  ACCESS:  Femoral artery and vein.  TOTAL CONTRAST USED:  25 mL.  STUDY DETAILS:  A standard informed consent was obtained.  The right groin area was prepped in a sterile fashion.  It was anesthetized with 1% lidocaine.  A 7-French sheath was placed in the right femoral vein. A 5-French sheath was placed in the right femoral artery.  Right heart catheterization was performed with a Swan-Ganz balloon tip catheter. Coronary angiography was performed with a JL-4, JR-4 and pigtail catheter was used to cross the aortic valve.  All catheter exchanges were  done over the wire.  The patient tolerated the procedure well with no immediate complications.  STUDY FINDINGS:  Hemodynamic findings:  The right atrial pressure is 10/12 with a mean of 10.  Right ventricular pressure was 35/7 with a right ventricular end-diastolic pressure of 9 mmHg.  Pulmonary capillary wedge pressure mean is 15 mmHg.  PA pressure is 35/13 with a mean pressure of 23 mmHg.  Left ventricular pressure is 122/4 with a left ventricular end-diastolic pressure of 13.  The aortic pressure is 125/56 with a mean pressure of 80 mmHg.  Femoral artery saturation is 95%. Right pulmonary artery saturation is 63%.  Calculated cardiac output is 5.13 with a cardiac index of 2.28.  Pulmonary vascular resistance is 1.36 Woods units.  CORONARY ANGIOGRAPHY:  Left main coronary artery:  The vessel is normal in size and free of significant disease. Left anterior descending artery:  The vessel is normal in size with minor calcifications.  There is a 30% stenosis in the mid segment right after a septal branch.  The rest of the LAD is free of any significant disease.  First diagonal is very small in size.  Second diagonal is a large size branch with mild ostial stenosis but no obstructive disease. Third diagonal is very tiny branch. Left circumflex artery:  The vessel is  normal in size and nondominant. OM-1 is very small in size.  OM-2 is normal in size with 30% proximal disease.  OM-3 is small in size. Ramus artery:  The vessel is medium in size and free of significant disease. Right coronary artery:  The vessel is mildly calcified.  There is a 20% lesion in the proximal segment.  There is a 30-40% stenosis in the mid segment.  The rest of the vessel has no evidence of disease.  The right PDA is large in size and free of significant disease.  There are three posterolateral branches which are free of significant disease.  STUDY CONCLUSIONS: 1. Mild nonobstructive coronary artery  disease. 2. Mildly elevated filling pressures with no evidence of significant     pulmonary hypertension.  Normal cardiac output.  RECOMMENDATIONS:  Medical therapy is recommended.     [name] Sacramento, MD     MA/MEDQ  D:  04/24/2011  T:  04/25/2011  Job:  [id number]  cc:   [name] Lor, MD [name] [name]. [name] Blalock, MD, [facility] Dr. Posey [name]  Electronically Signed by [name] Sacramento MD on 05/08/2011 02:37:23 PM

## 2011-05-08 NOTE — Discharge Summary (Signed)
[name] [name], [name] NO.:  [id number]  MEDICAL RECORD NO.:  [name] [name]  LOCATION:  2031                         FACILITY:  [place]  PHYSICIAN:  [name], MD     DATE OF BIRTH:  1928/06/10  DATE OF ADMISSION:  04/23/2011 DATE OF DISCHARGE:  04/24/2011                              DISCHARGE SUMMARY   PRIMARY. CARDIOLOGIST:  Marijo Conception. Verl Blalock, MD, [facility]  PRIMARY CARE PROVIDER:  Marletta Lor, MD  DISCHARGE DIAGNOSES: 1. Mild nonobstructive coronary artery disease per cardiac     catheterization in this admission.     a.     Mildly elevated filling pressures per cardiac      catheterization this admission. 2. Chronic kidney disease.  SECONDARY DIAGNOSES: 1. Non-insulin-dependent diabetes mellitus. 2. Hypertension. 3. Benign prostatic hypertrophy.  ALLERGIES: 1. AMOXICILLIN. 2. LEVOFLOXACIN.  PROCEDURES/DIAGNOSTICS PERFORMED DURING HOSPITALIZATION: 1. Right and left heart catheterization with coronary angiography.     a.     Mild nonobstructive coronary artery disease. 2. Mildly elevated filling pressures.  Left ventricular end-diastolic     pressure of 13, pulmonary capillary wedge pressure 16.  RV 35/7.  REASON FOR HOSPITALIZATION:  This is an 77 year old gentleman who presented for followup of progressive dyspnea and extreme fatigue over the last several months.  He denies any chest pain.  It was felt the patient should proceed with cardiac catheterization.  The patient does have chronic kidney disease and is followed by Dr. Posey Pronto, Dr. Verl Blalock discussed this with Dr. Posey Pronto and felt that he should be admitted for hydration prior to catheterization.  HOSPITAL COURSE:  The patient was admitted to the Telemetry Unit on April 23, 2011, for hydration.  The patient was hydrated throughout the evening.  His lisinopril and Demadex have been on hold for 3 days prior to catheterization per Dr. Winnifred Friar orders.  The patient states he has been compliant with  this.  Upon further evaluation, Dr. Verl Blalock felt the patient should receive right and left heart catheterization, he was agreeable to this.  On April 24, 2011, Dr. Fletcher Anon brought the patient to the cath lab, informed consent was obtained.  As above, catheterization showed mild nonobstructive coronary artery disease.  The patient contrast use was 25 mL.  Creatinine on day of catheterization was 1.74. Again, mildly elevated filling pressures with an RV of 35/7.  PA 35/13/23.  Left ventricular end-diastolic pressure of 13.  Pulmonary capillary wedge pressure of 16.  Post catheterization, Dr. Fletcher Anon felt patient's dyspnea is secondary to coronary artery disease.  He has received bicarb postcatheterization. We will have a repeat BMET in 4-5 days.  After hydration, Dr. Fletcher Anon felt the patient was stable for discharge.  His femoral site was without any hematoma.  DISCHARGE LABS:  Sodium 150, potassium 4, BUN 29, creatinine 1.74.  DISCHARGE MEDICATIONS: 1. Actos 45 mg half tablet daily. 2. Allopurinol 100 mg 1 tablet daily. 3. Aspirin enteric coated 81 mg 1 tablet daily. 4. Glipizide 10 mg 1 tablet daily. 5. Januvia 50 mg 1 tablet daily. 6. Kayexalate 4 ounces by mouth every Friday. 7. Lisinopril 40 mg 1 tablet daily. 8. Torsemide 10 mg 1 tablet Monday, Wednesday, and  Friday.  FOLLOWUP PLANS AND INSTRUCTIONS: 1. The patient should increase activity as tolerated.  The patient may     shower, but no bathing.  No lifting for 1 week greater than equal     to 5 pounds.  No driving for 2 days.  No sexual activity for 1     week.  The patient to keep his cath site clean and dry and call the     office for any problems. 2. The patient will follow up with Dr. Verl Blalock in 1-2 weeks.  He will     have a BMET drawn on the 18th. 3. The patient should avoid straining.  He is to stop any activity     that cause chest pain or shortness of breath. 4. The patient should call the office for any problems or  concerns.  Duration of discharge greater than 30 minutes including physician and physician assistant time.     Cecille Amsterdam, PA-C   ______________________________ [name], MD    NB/MEDQ  D:  04/24/2011  T:  04/25/2011  Job:  [name] [name]  cc:   Marletta Lor, MD Marijo Conception. Verl Blalock, MD, [facility]  Electronically Signed by Pennie Rushing P.A. on 05/05/2011 05:00:51 PM Electronically Signed by [name] MD on 05/08/2011 02:38:39 PM

## 2011-05-09 ENCOUNTER — Other Ambulatory Visit (INDEPENDENT_AMBULATORY_CARE_PROVIDER_SITE_OTHER): Payer: Medicare Other | Admitting: *Deleted

## 2011-05-09 ENCOUNTER — Ambulatory Visit (INDEPENDENT_AMBULATORY_CARE_PROVIDER_SITE_OTHER): Payer: Medicare Other | Admitting: Physician Assistant

## 2011-05-09 ENCOUNTER — Encounter: Payer: Self-pay | Admitting: Physician Assistant

## 2011-05-09 VITALS — BP 144/67 | HR 70 | Ht 74.0 in | Wt 221.0 lb

## 2011-05-09 DIAGNOSIS — N189 Chronic kidney disease, unspecified: Secondary | ICD-10-CM

## 2011-05-09 DIAGNOSIS — R0989 Other specified symptoms and signs involving the circulatory and respiratory systems: Secondary | ICD-10-CM

## 2011-05-09 DIAGNOSIS — I25810 Atherosclerosis of coronary artery bypass graft(s) without angina pectoris: Secondary | ICD-10-CM | POA: Insufficient documentation

## 2011-05-09 DIAGNOSIS — I2510 Atherosclerotic heart disease of native coronary artery without angina pectoris: Secondary | ICD-10-CM

## 2011-05-09 DIAGNOSIS — I10 Essential (primary) hypertension: Secondary | ICD-10-CM

## 2011-05-09 LAB — BASIC METABOLIC PANEL
BUN: 34 mg/dL — ABNORMAL HIGH (ref 6–23)
CO2: 32 mEq/L (ref 19–32)
Calcium: 8.7 mg/dL (ref 8.4–10.5)
Chloride: 97 mEq/L (ref 96–112)
Creatinine, Ser: 2 mg/dL — ABNORMAL HIGH (ref 0.4–1.5)
GFR: 33.71 mL/min — ABNORMAL LOW (ref >60.00)
Glucose, Bld: 221 mg/dL — ABNORMAL HIGH (ref 70–99)
Potassium: 4.8 mEq/L (ref 3.5–5.1)
Sodium: 137 mEq/L (ref 135–145)

## 2011-05-09 MED ORDER — OLMESARTAN MEDOXOMIL 40 MG PO TABS: 40.0000 mg | ORAL_TABLET | Freq: Every day | ORAL | Status: DC

## 2011-05-09 NOTE — Assessment & Plan Note (Signed)
D/C Lisinopril.  Start Benicar 40 mg QD.  Check BMET today and repeat in 2 weeks and send to Dr. Posey Pronto.

## 2011-05-09 NOTE — Assessment & Plan Note (Signed)
Mild plaque disease on cath.  No further cardiac workup necessary.  He can continue ASA.  He is establishing with Dr. Damita Dunnings at [facility].  He can have his cholesterol checked then and consider statin therapy.  Follow up with Dr. Verl Blalock as needed.

## 2011-05-09 NOTE — Progress Notes (Signed)
History of Present Illness: Primary Cardiologist:  Dr. Jenell Milliner  [name] Small is a 77 y.o. male who presents for post hospitalization follow up.  He was initially evaluated by Dr. Verl Blalock for dyspnea and fatigue.  He has multiple cardiac risk factors including chronic kidney disease, diabetes and hypertension.  He was set up for cardiac catheterization with pre-hydration.  Cardiac catheterization was performed on 6/14 and demonstrated nonobstructive disease (mid LAD 30%, proximal obtuse marginal-2 30%, proximal RCA 20%, mid RCA 30-40%.  He had normal cardiac output and mildly elevated filling pressures but no significant pulmonary hypertension.  Post catheterization, his creatinine was 1.8.  He received sodium bicarbonate protocol post catheterization as well.  Echocardiogram in May 2012 demonstrated EF 50-55% and left atrial enlargement.  Doing well.  No chest pain or syncope.  Still has dyspnea with exertion and fatigue.  Notes a cough at night.  Not sure how long he has been taking Lisinopril.  No orthopnea, PND.  Has chronic left ankle injury and some swelling associated.    Past Medical History  Diagnosis Date  . BENIGN PROSTATIC HYPERTROPHY 05/21/2007  . CHRONIC KIDNEY DISEASE UNSPECIFIED 12/04/2007  . DIABETES MELLITUS, TYPE II 05/21/2007  . DIVERTICULOSIS, COLON 05/21/2007  . HYPERTENSION 05/21/2007  . PSORIASIS, SCALP 10/25/2008  . CAD (coronary artery disease)     nonobstructive by cath 6/12:  mid LAD 30%, proximal obtuse marginal-2 30%, proximal RCA 20%, mid RCA 30-40%.  He had normal cardiac output and mildly elevated filling pressures but no significant pulmonary hypertension;   Echocardiogram in May 2012 demonstrated EF 50-55% and left atrial enlargement     Current Outpatient Prescriptions  Medication Sig Dispense Refill  . allopurinol (ZYLOPRIM) 100 MG tablet Take 100 mg by mouth daily.        Marland Kitchen aspirin 81 MG tablet Take 81 mg by mouth daily.        Marland Kitchen glipiZIDE (GLUCOTROL) 10 MG  24 hr tablet Take 10 mg by mouth daily.        Marland Kitchen lisinopril (PRINIVIL,ZESTRIL) 40 MG tablet Take 40 mg by mouth every evening.       . pioglitazone (ACTOS) 45 MG tablet Take by mouth. 1/2 tablet daily       . sitaGLIPtan (JANUVIA) 50 MG tablet Take 50 mg by mouth daily.        . sodium polystyrene (SPS) 15 GM/60ML suspension Take 15 g by mouth once. Take 4oz every friday       . torsemide (DEMADEX) 10 MG tablet One every morning on Monday Wednesdays and Fridays only  30 tablet  11    Allergies: Allergies  Allergen Reactions  . Amoxicillin     REACTION: unspecified  . Levofloxacin     REACTION: unspecified    Vital Signs: BP 144/67  Pulse 70  Ht 6\' 2"  (1.88 m)  Wt 221 lb (100.245 kg)  BMI 28.37 kg/m2  PHYSICAL EXAM: Well nourished, well developed, in no acute distress HEENT: normal Neck: no JVD Cardiac:  normal S1, S2; RRR; no murmur Lungs:  Decreased breath sounds bilaterally, no wheezing, rhonchi or rales Abd: soft, nontender, no hepatomegaly Ext: no edema;  RFA site without hematoma or bruit Skin: warm and dry Neuro:  CNs 2-12 intact, no focal abnormalities noted  EKG:  Sinus rhythm, heart rate 67, normal axis, no ischemic changes  ASSESSMENT AND PLAN:

## 2011-05-09 NOTE — Assessment & Plan Note (Signed)
He notes cough as well.  Question if he has ACE induced cough.  Discussed with Dr. Graylon Gunning today.  He is ok with him switching to an ARB.  He can follow up with Dr. Posey Pronto and his PCP.

## 2011-05-09 NOTE — Patient Instructions (Signed)
Your physician recommends that you schedule a follow-up appointment in: FOLLOW UP AS NEEDED  Your physician has recommended you make the following change in your medication: STOP LISINOPRIL; START BENICAR 40 MG 1 TABLET DAILY A NEW PRESCRIPTION HAS BEEN SENT IN TODAY FOR YOU.  Your physician recommends that you return for lab work in: [place] BMET 401.89; A REPEAT BMET 401.9 05/23/11 YOU MAY HAVE THIS DONE IN OUR OFFICE OR IN THE [facility] [place] LOCATION IF THAT IS MORE CONVIENIENT FOR YOU. WE WILL FAX YOU LAB RESULTS TO DR. Ulice Dash PATEL @ [facility].

## 2011-05-09 NOTE — Assessment & Plan Note (Signed)
Follow renal fxn and K+ with change of ACE to ARB.  He will follow with Dr. Posey Pronto.

## 2011-05-10 ENCOUNTER — Encounter: Payer: Self-pay | Admitting: Cardiology

## 2011-05-24 ENCOUNTER — Ambulatory Visit (INDEPENDENT_AMBULATORY_CARE_PROVIDER_SITE_OTHER): Payer: Medicare Other | Admitting: Family Medicine

## 2011-05-24 ENCOUNTER — Encounter: Payer: Self-pay | Admitting: Family Medicine

## 2011-05-24 ENCOUNTER — Telehealth: Payer: Self-pay | Admitting: *Deleted

## 2011-05-24 VITALS — BP 136/72 | HR 72 | Temp 97.9°F | Ht 74.0 in | Wt 223.4 lb

## 2011-05-24 DIAGNOSIS — E119 Type 2 diabetes mellitus without complications: Secondary | ICD-10-CM

## 2011-05-24 DIAGNOSIS — R05 Cough: Secondary | ICD-10-CM

## 2011-05-24 DIAGNOSIS — R059 Cough, unspecified: Secondary | ICD-10-CM

## 2011-05-24 DIAGNOSIS — N189 Chronic kidney disease, unspecified: Secondary | ICD-10-CM

## 2011-05-24 MED ORDER — LOSARTAN POTASSIUM 50 MG PO TABS: 50.0000 mg | ORAL_TABLET | Freq: Every day | ORAL | Status: DC

## 2011-05-24 NOTE — Telephone Encounter (Signed)
Wife says that they checked with the pharmacy and the benicar is going to cost them $146.00 per month and they can't afford it. She is asking if there is something else you could prescribe.

## 2011-05-24 NOTE — Telephone Encounter (Signed)
Have them change to losartan.  Continue with the plan for labs in 1 week.  Thanks.

## 2011-05-24 NOTE — Telephone Encounter (Signed)
Patient notified and will start new meds as prescribed.

## 2011-05-24 NOTE — Patient Instructions (Signed)
Stop the lisinopril and start the benicar.  Came back for labs in 1 week after the change.   Schedule a 3 month follow up appointment- 44min- with lab visit ahead of time.   Take care.  Glad to see you.

## 2011-05-26 ENCOUNTER — Encounter: Payer: Self-pay | Admitting: Family Medicine

## 2011-05-26 DIAGNOSIS — R05 Cough: Secondary | ICD-10-CM | POA: Insufficient documentation

## 2011-05-26 DIAGNOSIS — R059 Cough, unspecified: Secondary | ICD-10-CM | POA: Insufficient documentation

## 2011-05-26 NOTE — Progress Notes (Signed)
Diabetes:  Using medications without difficulties:yes, max out on oral meds and due for A1c.  Hypoglycemic episodes:no Hyperglycemic episodes:no Feet problems:no Blood Sugars averaging: 130-140s We talked about options.  See below about labs and insulin.  Not a candidate for metformin due to renal status.   Cough.  No heartburn, no asthma, on ACE.  ACE was thought to be the cause and he hasn't started ARB yet.  See below.    PMH and SH reviewed  Meds, vitals, and allergies reviewed.   ROS: See HPI.  Otherwise negative.    NAD HEENT: mmm RRR; no murmur  Lungs: no wheezing, rhonchi or rales, no focal dec in bs Abd: soft, nontender Ext: no edema on R foot, trace on L foot Skin: warm and dry

## 2011-05-26 NOTE — Assessment & Plan Note (Addendum)
Check A1c on next set of labs and then will address potential insulin use at that point.  >25 min spent with face to face with patient, >50% counseling.

## 2011-05-26 NOTE — Assessment & Plan Note (Signed)
Presumed ACE cough, change to ARB and then return for labs.  D/w pt.

## 2011-05-31 ENCOUNTER — Other Ambulatory Visit (INDEPENDENT_AMBULATORY_CARE_PROVIDER_SITE_OTHER): Payer: Medicare Other | Admitting: Family Medicine

## 2011-05-31 DIAGNOSIS — I10 Essential (primary) hypertension: Secondary | ICD-10-CM

## 2011-05-31 DIAGNOSIS — N189 Chronic kidney disease, unspecified: Secondary | ICD-10-CM

## 2011-05-31 DIAGNOSIS — E119 Type 2 diabetes mellitus without complications: Secondary | ICD-10-CM

## 2011-05-31 LAB — HEMOGLOBIN A1C: Hgb A1c MFr Bld: 7.9 % — ABNORMAL HIGH (ref 4.6–6.5)

## 2011-05-31 LAB — BASIC METABOLIC PANEL
BUN: 36 mg/dL — ABNORMAL HIGH (ref 6–23)
CO2: 35 mEq/L — ABNORMAL HIGH (ref 19–32)
Calcium: 8.8 mg/dL (ref 8.4–10.5)
Chloride: 98 mEq/L (ref 96–112)
Creatinine, Ser: 1.9 mg/dL — ABNORMAL HIGH (ref 0.4–1.5)
GFR: 37.52 mL/min — ABNORMAL LOW (ref >60.00)
Glucose, Bld: 148 mg/dL — ABNORMAL HIGH (ref 70–99)
Potassium: 4.9 mEq/L (ref 3.5–5.1)
Sodium: 139 mEq/L (ref 135–145)

## 2011-05-31 NOTE — Progress Notes (Signed)
Addended by: Marchia Bond on: 05/31/2011 08:35 AM   Modules accepted: Orders

## 2011-07-15 LAB — HM DIABETES EYE EXAM

## 2011-07-18 ENCOUNTER — Encounter: Payer: Self-pay | Admitting: Family Medicine

## 2011-07-23 ENCOUNTER — Encounter (INDEPENDENT_AMBULATORY_CARE_PROVIDER_SITE_OTHER): Payer: Medicare Other | Admitting: Ophthalmology

## 2011-07-23 DIAGNOSIS — H35379 Puckering of macula, unspecified eye: Secondary | ICD-10-CM

## 2011-07-23 DIAGNOSIS — H3530 Unspecified macular degeneration: Secondary | ICD-10-CM

## 2011-07-23 DIAGNOSIS — E11319 Type 2 diabetes mellitus with unspecified diabetic retinopathy without macular edema: Secondary | ICD-10-CM

## 2011-07-23 DIAGNOSIS — H43819 Vitreous degeneration, unspecified eye: Secondary | ICD-10-CM

## 2011-07-24 ENCOUNTER — Telehealth: Payer: Self-pay | Admitting: Family Medicine

## 2011-07-24 NOTE — Telephone Encounter (Signed)
Opened in error

## 2011-07-30 ENCOUNTER — Other Ambulatory Visit: Payer: Self-pay | Admitting: *Deleted

## 2011-07-30 MED ORDER — GLIPIZIDE ER 10 MG PO TB24: 10.0000 mg | ORAL_TABLET | Freq: Every day | ORAL | Status: DC

## 2011-08-06 ENCOUNTER — Encounter (INDEPENDENT_AMBULATORY_CARE_PROVIDER_SITE_OTHER): Payer: Medicare Other | Admitting: Ophthalmology

## 2011-08-06 DIAGNOSIS — H3581 Retinal edema: Secondary | ICD-10-CM

## 2011-08-16 ENCOUNTER — Other Ambulatory Visit: Payer: Self-pay | Admitting: Family Medicine

## 2011-08-16 DIAGNOSIS — E119 Type 2 diabetes mellitus without complications: Secondary | ICD-10-CM

## 2011-08-20 ENCOUNTER — Other Ambulatory Visit (INDEPENDENT_AMBULATORY_CARE_PROVIDER_SITE_OTHER): Payer: Medicare Other

## 2011-08-20 DIAGNOSIS — E119 Type 2 diabetes mellitus without complications: Secondary | ICD-10-CM

## 2011-08-20 LAB — HEMOGLOBIN A1C: Hgb A1c MFr Bld: 8 % — ABNORMAL HIGH (ref 4.6–6.5)

## 2011-08-20 LAB — LIPID PANEL
Cholesterol: 181 mg/dL (ref 0–200)
HDL: 43.5 mg/dL (ref >39.00)
LDL Cholesterol: 100 mg/dL — ABNORMAL HIGH (ref 0–99)
Total CHOL/HDL Ratio: 4
Triglycerides: 190 mg/dL — ABNORMAL HIGH (ref 0.0–149.0)
VLDL: 38 mg/dL (ref 0.0–40.0)

## 2011-08-26 ENCOUNTER — Encounter: Payer: Self-pay | Admitting: Family Medicine

## 2011-08-27 ENCOUNTER — Ambulatory Visit (INDEPENDENT_AMBULATORY_CARE_PROVIDER_SITE_OTHER): Payer: Medicare Other | Admitting: Family Medicine

## 2011-08-27 ENCOUNTER — Encounter: Payer: Self-pay | Admitting: Family Medicine

## 2011-08-27 ENCOUNTER — Other Ambulatory Visit: Payer: Self-pay | Admitting: *Deleted

## 2011-08-27 VITALS — BP 130/56 | HR 72 | Temp 97.5°F | Wt 222.0 lb

## 2011-08-27 DIAGNOSIS — E1139 Type 2 diabetes mellitus with other diabetic ophthalmic complication: Secondary | ICD-10-CM

## 2011-08-27 DIAGNOSIS — R252 Cramp and spasm: Secondary | ICD-10-CM

## 2011-08-27 DIAGNOSIS — E11319 Type 2 diabetes mellitus with unspecified diabetic retinopathy without macular edema: Secondary | ICD-10-CM

## 2011-08-27 DIAGNOSIS — E119 Type 2 diabetes mellitus without complications: Secondary | ICD-10-CM

## 2011-08-27 DIAGNOSIS — Z23 Encounter for immunization: Secondary | ICD-10-CM

## 2011-08-27 MED ORDER — "PEN NEEDLES 5/16"" 31G X 8 MM MISC": Status: DC

## 2011-08-27 MED ORDER — INSULIN GLARGINE 100 UNIT/ML ~~LOC~~ SOLN: 5.0000 [IU] | Freq: Every day | SUBCUTANEOUS | Status: DC

## 2011-08-27 MED ORDER — ALLOPURINOL 100 MG PO TABS: 100.0000 mg | ORAL_TABLET | Freq: Every day | ORAL | Status: DC

## 2011-08-27 NOTE — Progress Notes (Signed)
Frequent nocturnal leg cramps.  Fatigue still noted.  Had some bruising with ASA daily, so he quit it.  I asked him to restart it MWF.    Diabetes:  Using medications without difficulties:yes Hypoglycemic episodes:no Hyperglycemic episodes:no Feet problems: L>R foot edema that is episodic.  Blood Sugars averaging: 100-130 eye exam within last year:yes A1c 8.0 He has laser surgery for DM exudates.  Prev with cataract surgery a few years ago.    Cough, likely ACE related, now resolved.     PMH and SH reviewed  Meds, vitals, and allergies reviewed.   ROS: See HPI.  Otherwise negative.    GEN: nad, alert and oriented HEENT: mucous membranes moist NECK: supple w/o LA CV: rrr. PULM: ctab, no inc wob ABD: soft, +bs EXT: no edema SKIN: no acute rash  Diabetic foot exam: Normal inspection No skin breakdown calluses noted on feet 1+ DP pulses Dec sensation to light touch and monofilament on feet Nails thickened

## 2011-08-27 NOTE — Patient Instructions (Addendum)
Come back for a nurse visit to use the insulin pen (after it comes via mail order). Recheck labs in 3 months with 27min OV a few days later.  Cut out sweets.   If your AM sugar is 80 or lower, decrease your dose by 1 unit. If your AM sugar is 110 or higher, increase your dose by 1 unit. If your sugar is 81 to 109, no change in dose.  Start with 5 units a day.  Bring all of your meds to the office visits.

## 2011-08-28 ENCOUNTER — Encounter: Payer: Self-pay | Admitting: Family Medicine

## 2011-08-28 DIAGNOSIS — R252 Cramp and spasm: Secondary | ICD-10-CM | POA: Insufficient documentation

## 2011-08-28 NOTE — Assessment & Plan Note (Signed)
>  25 min spent with face to face with patient, >50% counseling. Uncontrolled. Needs diet adherence and insulin start.  D/w pt and wife about both.  Needs to get lantus pen and then come to clinic for teaching with RN visit.  Went over dose titration based on AM sugar values.  He understood. Return for A1c 3 months.

## 2011-08-28 NOTE — Assessment & Plan Note (Signed)
D/w pt about stretching before bed and he'll notify me if sx continue.

## 2011-09-03 ENCOUNTER — Ambulatory Visit: Payer: Medicare Other

## 2011-09-03 ENCOUNTER — Other Ambulatory Visit: Payer: Self-pay | Admitting: Nephrology

## 2011-09-04 ENCOUNTER — Ambulatory Visit
Admission: RE | Admit: 2011-09-04 | Discharge: 2011-09-04 | Disposition: A | Discharge status: Home / Self Care | Payer: Medicare Other | Source: Ambulatory Visit | Attending: Nephrology | Admitting: Nephrology

## 2011-09-06 ENCOUNTER — Ambulatory Visit: Payer: Medicare Other

## 2011-09-22 ENCOUNTER — Other Ambulatory Visit: Payer: Self-pay | Admitting: Family Medicine

## 2011-09-22 ENCOUNTER — Encounter: Payer: Self-pay | Admitting: Family Medicine

## 2011-09-22 DIAGNOSIS — C679 Malignant neoplasm of bladder, unspecified: Secondary | ICD-10-CM | POA: Insufficient documentation

## 2011-09-25 ENCOUNTER — Other Ambulatory Visit: Payer: Self-pay | Admitting: Urology

## 2011-10-12 DIAGNOSIS — C679 Malignant neoplasm of bladder, unspecified: Secondary | ICD-10-CM

## 2011-10-12 HISTORY — DX: Malignant neoplasm of bladder, unspecified: C67.9

## 2011-10-28 ENCOUNTER — Encounter (HOSPITAL_BASED_OUTPATIENT_CLINIC_OR_DEPARTMENT_OTHER): Payer: Self-pay | Admitting: *Deleted

## 2011-10-28 NOTE — Progress Notes (Signed)
NPO AFTER MN. PT ARRIVES AT 0715. CURRENT CXR 03-01-11 W/ CHART.  CURRENT EKG , LAST NOTE AND ECHO TO BE FAXED FROM DR WALL. PT STATES HAD LABS DRAWN AT DR PATEL OFFICE TODAY (10-28-11) WILL REQUEST RESULTS AND NOTE.  IF ABNORMAL WILL NEED ISTAT.

## 2011-10-29 NOTE — Anesthesia Preprocedure Evaluation (Addendum)
Anesthesia Evaluation  Patient identified by MRN, date of birth, ID band Patient awake    Reviewed: Allergy & Precautions, H&P , NPO status , Patient's Chart, lab work & pertinent test results  Airway Mallampati: II TM Distance: >3 FB Neck ROM: full    Dental  (+) Edentulous Upper and Edentulous Lower   Pulmonary neg pulmonary ROS, shortness of breath and with exertion,  clear to auscultation  Pulmonary exam normal       Cardiovascular Exercise Tolerance: Good hypertension, On Medications + CAD, + Past MI and neg cardio ROS regular Normal Non obstructive CAD.  40% block   Neuro/Psych Negative Neurological ROS  Negative Psych ROS   GI/Hepatic negative GI ROS, Neg liver ROS,   Endo/Other  Negative Endocrine ROSDiabetes mellitus-, Well Controlled, Type 2, Insulin Dependent  Renal/GU Renal InsufficiencyRenal disease  Genitourinary negative   Musculoskeletal   Abdominal   Peds  Hematology negative hematology ROS (+)   Anesthesia Other Findings   Reproductive/Obstetrics negative OB ROS                         Anesthesia Physical Anesthesia Plan  ASA: III  Anesthesia Plan: General   Post-op Pain Management:    Induction: Intravenous  Airway Management Planned: LMA  Additional Equipment:   Intra-op Plan:   Post-operative Plan:   Informed Consent: I have reviewed the patients History and Physical, chart, labs and discussed the procedure including the risks, benefits and alternatives for the proposed anesthesia with the patient or authorized representative who has indicated his/her understanding and acceptance.   Dental Advisory Given  Plan Discussed with: CRNA  Anesthesia Plan Comments:         Anesthesia Quick Evaluation

## 2011-10-30 ENCOUNTER — Ambulatory Visit (HOSPITAL_BASED_OUTPATIENT_CLINIC_OR_DEPARTMENT_OTHER): Payer: Medicare Other | Admitting: Anesthesiology

## 2011-10-30 ENCOUNTER — Other Ambulatory Visit: Payer: Self-pay | Admitting: Urology

## 2011-10-30 ENCOUNTER — Encounter (HOSPITAL_BASED_OUTPATIENT_CLINIC_OR_DEPARTMENT_OTHER)
Admission: RE | Disposition: A | Discharge status: Home / Self Care | Payer: Self-pay | Source: Ambulatory Visit | Attending: Urology

## 2011-10-30 ENCOUNTER — Encounter (HOSPITAL_BASED_OUTPATIENT_CLINIC_OR_DEPARTMENT_OTHER): Payer: Self-pay | Admitting: Anesthesiology

## 2011-10-30 ENCOUNTER — Encounter (HOSPITAL_BASED_OUTPATIENT_CLINIC_OR_DEPARTMENT_OTHER): Payer: Self-pay

## 2011-10-30 ENCOUNTER — Ambulatory Visit (HOSPITAL_BASED_OUTPATIENT_CLINIC_OR_DEPARTMENT_OTHER)
Admission: RE | Admit: 2011-10-30 | Discharge: 2011-10-30 | Disposition: A | Discharge status: Home / Self Care | Payer: Medicare Other | Source: Ambulatory Visit | Attending: Urology | Admitting: Urology

## 2011-10-30 DIAGNOSIS — Z7982 Long term (current) use of aspirin: Secondary | ICD-10-CM | POA: Insufficient documentation

## 2011-10-30 DIAGNOSIS — C671 Malignant neoplasm of dome of bladder: Secondary | ICD-10-CM | POA: Insufficient documentation

## 2011-10-30 DIAGNOSIS — I10 Essential (primary) hypertension: Secondary | ICD-10-CM | POA: Insufficient documentation

## 2011-10-30 DIAGNOSIS — Z794 Long term (current) use of insulin: Secondary | ICD-10-CM | POA: Insufficient documentation

## 2011-10-30 DIAGNOSIS — Z79899 Other long term (current) drug therapy: Secondary | ICD-10-CM | POA: Insufficient documentation

## 2011-10-30 DIAGNOSIS — E119 Type 2 diabetes mellitus without complications: Secondary | ICD-10-CM | POA: Insufficient documentation

## 2011-10-30 DIAGNOSIS — N289 Disorder of kidney and ureter, unspecified: Secondary | ICD-10-CM | POA: Insufficient documentation

## 2011-10-30 DIAGNOSIS — D494 Neoplasm of unspecified behavior of bladder: Secondary | ICD-10-CM | POA: Insufficient documentation

## 2011-10-30 HISTORY — DX: Other fatigue: R53.83

## 2011-10-30 HISTORY — DX: Reserved for inherently not codable concepts without codable children: IMO0001

## 2011-10-30 HISTORY — PX: TRANSURETHRAL RESECTION OF BLADDER TUMOR: SHX2575

## 2011-10-30 HISTORY — DX: Chronic kidney disease, stage 3 (moderate): N18.3

## 2011-10-30 HISTORY — DX: Psoriasis, unspecified: L40.9

## 2011-10-30 HISTORY — PX: CYSTOSCOPY W/ RETROGRADES: SHX1426

## 2011-10-30 HISTORY — DX: Unspecified osteoarthritis, unspecified site: M19.90

## 2011-10-30 HISTORY — DX: Unspecified hearing loss, unspecified ear: H91.90

## 2011-10-30 HISTORY — DX: Gross hematuria: R31.0

## 2011-10-30 LAB — GLUCOSE, CAPILLARY: Glucose-Capillary: 85 mg/dL (ref 70–99)

## 2011-10-30 LAB — POCT I-STAT 4, (NA,K, GLUC, HGB,HCT)
Glucose, Bld: 77 mg/dL (ref 70–99)
HCT: 41 % (ref 39.0–52.0)
Hemoglobin: 13.9 g/dL (ref 13.0–17.0)
Potassium: 4.6 mEq/L (ref 3.5–5.1)
Sodium: 142 mEq/L (ref 135–145)

## 2011-10-30 SURGERY — CYSTOSCOPY, WITH RETROGRADE PYELOGRAM
Anesthesia: General | Site: Bladder

## 2011-10-30 MED ORDER — PHENAZOPYRIDINE HCL 100 MG PO TABS: 100.0000 mg | ORAL_TABLET | Freq: Three times a day (TID) | ORAL | Status: AC | PRN

## 2011-10-30 MED ORDER — FENTANYL CITRATE 0.05 MG/ML IJ SOLN
INTRAMUSCULAR | Status: DC | PRN
  Administered 2011-10-30 (×2): 25 ug via INTRAVENOUS
  Administered 2011-10-30: 50 ug via INTRAVENOUS

## 2011-10-30 MED ORDER — FENTANYL CITRATE 0.05 MG/ML IJ SOLN: 25.0000 ug | INTRAMUSCULAR | Status: DC | PRN

## 2011-10-30 MED ORDER — PROMETHAZINE HCL 25 MG/ML IJ SOLN: 6.2500 mg | INTRAMUSCULAR | Status: DC | PRN

## 2011-10-30 MED ORDER — ONDANSETRON HCL 4 MG/2ML IJ SOLN
INTRAMUSCULAR | Status: DC | PRN
  Administered 2011-10-30: 4 mg via INTRAVENOUS

## 2011-10-30 MED ORDER — HYOSCYAMINE SULFATE 0.125 MG PO TABS: 0.1250 mg | ORAL_TABLET | ORAL | Status: DC | PRN

## 2011-10-30 MED ORDER — SENNOSIDES-DOCUSATE SODIUM 8.6-50 MG PO TABS: 1.0000 | ORAL_TABLET | Freq: Two times a day (BID) | ORAL | Status: DC

## 2011-10-30 MED ORDER — DOXYCYCLINE HYCLATE 100 MG PO TABS: 100.0000 mg | ORAL_TABLET | Freq: Two times a day (BID) | ORAL | Status: AC

## 2011-10-30 MED ORDER — PROPOFOL 10 MG/ML IV EMUL
INTRAVENOUS | Status: DC | PRN
  Administered 2011-10-30: 200 mg via INTRAVENOUS

## 2011-10-30 MED ORDER — LACTATED RINGERS IV SOLN: INTRAVENOUS | Status: DC

## 2011-10-30 MED ORDER — OXYCODONE-ACETAMINOPHEN 5-325 MG PO TABS: 1.0000 | ORAL_TABLET | ORAL | Status: DC | PRN

## 2011-10-30 MED ORDER — LIDOCAINE HCL (CARDIAC) 20 MG/ML IV SOLN
INTRAVENOUS | Status: DC | PRN
  Administered 2011-10-30: 60 mg via INTRAVENOUS
  Administered 2011-10-30: 40 mg via INTRAVENOUS

## 2011-10-30 MED ORDER — SODIUM CHLORIDE 0.9 % IR SOLN
Status: DC | PRN
  Administered 2011-10-30: 2

## 2011-10-30 MED ORDER — GENTAMICIN IN SALINE 1.6-0.9 MG/ML-% IV SOLN
80.0000 mg | INTRAVENOUS | Status: AC
  Administered 2011-10-30: 80 mg via INTRAVENOUS

## 2011-10-30 MED ORDER — LACTATED RINGERS IV SOLN
INTRAVENOUS | Status: DC
  Administered 2011-10-30 (×2): via INTRAVENOUS

## 2011-10-30 MED ORDER — EPHEDRINE SULFATE 50 MG/ML IJ SOLN
INTRAMUSCULAR | Status: DC | PRN
  Administered 2011-10-30: 5 mg via INTRAVENOUS
  Administered 2011-10-30: 10 mg via INTRAVENOUS
  Administered 2011-10-30 (×2): 5 mg via INTRAVENOUS

## 2011-10-30 MED ORDER — IOHEXOL 350 MG/ML SOLN
INTRAVENOUS | Status: DC | PRN
  Administered 2011-10-30: 50 mL via INTRAVENOUS

## 2011-10-30 SURGICAL SUPPLY — 46 items
ADAPTER CATH URET PLST 4-6FR (CATHETERS) ×2 IMPLANT
ADPR CATH URET STRL DISP 4-6FR (CATHETERS) ×3
BAG DRAIN URO-CYSTO SKYTR STRL (DRAIN) ×4 IMPLANT
BAG DRN UROCATH (DRAIN) ×3
BAG URINE DRAINAGE (UROLOGICAL SUPPLIES) ×2 IMPLANT
BASKET LASER NITINOL 1.9FR (BASKET) IMPLANT
BASKET STNLS GEMINI 4WIRE 3FR (BASKET) IMPLANT
BASKET ZERO TIP NITINOL 2.4FR (BASKET) IMPLANT
BRUSH URET BIOPSY 3F (UROLOGICAL SUPPLIES) IMPLANT
BSKT STON RTRVL 120 1.9FR (BASKET)
BSKT STON RTRVL GEM 120X11 3FR (BASKET)
BSKT STON RTRVL ZERO TP 2.4FR (BASKET)
CANISTER SUCT LVC 12 LTR MEDI- (MISCELLANEOUS) ×6 IMPLANT
CATH FOLEY 2WAY SLVR  5CC 18FR (CATHETERS) ×1
CATH FOLEY 2WAY SLVR 5CC 18FR (CATHETERS) ×1 IMPLANT
CATH INTERMIT  6FR 70CM (CATHETERS) ×2 IMPLANT
CATH URET 5FR 28IN CONE TIP (BALLOONS)
CATH URET 5FR 28IN OPEN ENDED (CATHETERS) IMPLANT
CATH URET 5FR 70CM CONE TIP (BALLOONS) IMPLANT
CLOTH BEACON ORANGE TIMEOUT ST (SAFETY) ×4 IMPLANT
DRAPE CAMERA CLOSED 9X96 (DRAPES) ×4 IMPLANT
ELECT LOOP MED HF 24F 12D CBL (CLIP) ×2 IMPLANT
ELECT REM PT RETURN 9FT ADLT (ELECTROSURGICAL)
ELECTRODE REM PT RTRN 9FT ADLT (ELECTROSURGICAL) IMPLANT
GLOVE BIO SURGEON STRL SZ 6.5 (GLOVE) ×2 IMPLANT
GLOVE ECLIPSE 6.0 STRL STRAW (GLOVE) ×2 IMPLANT
GLOVE ECLIPSE 7.0 STRL STRAW (GLOVE) ×4 IMPLANT
GLOVE INDICATOR 7.5 STRL GRN (GLOVE) ×4 IMPLANT
GOWN PREVENTION PLUS LG XLONG (DISPOSABLE) ×4 IMPLANT
GOWN STRL REIN XL XLG (GOWN DISPOSABLE) ×4 IMPLANT
GUIDEWIRE 0.038 PTFE COATED (WIRE) IMPLANT
GUIDEWIRE ANG ZIPWIRE 038X150 (WIRE) IMPLANT
GUIDEWIRE STR DUAL SENSOR (WIRE) ×4 IMPLANT
HOLDER FOLEY CATH W/STRAP (MISCELLANEOUS) ×2 IMPLANT
IV NS IRRIG 3000ML ARTHROMATIC (IV SOLUTION) ×18 IMPLANT
KIT ASPIRATION TUBING (SET/KITS/TRAYS/PACK) ×2 IMPLANT
KIT BALLIN UROMAX 15FX10 (LABEL) IMPLANT
KIT BALLN UROMAX 15FX4 (MISCELLANEOUS) IMPLANT
KIT BALLN UROMAX 26 75X4 (MISCELLANEOUS)
LASER FIBER DISP (UROLOGICAL SUPPLIES) IMPLANT
NS IRRIG 500ML POUR BTL (IV SOLUTION) IMPLANT
PACK CYSTOSCOPY (CUSTOM PROCEDURE TRAY) ×4 IMPLANT
SET HIGH PRES BAL DIL (LABEL)
SHEATH URET ACCESS 12FR/35CM (UROLOGICAL SUPPLIES) IMPLANT
SHEATH URET ACCESS 12FR/55CM (UROLOGICAL SUPPLIES) IMPLANT
SYRINGE IRR TOOMEY STRL 70CC (SYRINGE) IMPLANT

## 2011-10-30 NOTE — Progress Notes (Signed)
Pt's 02 sats ranges from 80%-90% on room air. [place] @ 3L/min 02 applied @ this time

## 2011-10-30 NOTE — Transfer of Care (Signed)
Immediate Anesthesia Transfer of Care Note  Patient: [name] Small  Procedure(s) Performed:  CYSTOSCOPY WITH RETROGRADE PYELOGRAM - CYSTOSCOPY POSS TURBT BILATERAL RETROGRADE PYLEOGRAM POSS BILATERAL URETEROSCOPY WITH BIOPSY LASER ABLATION OF LESION POSS BILATERAL URETERAL STENT PLACEMENT   CARM FLEXIBLE URETERAL SCOPE DIGITAL URETERAL SCOPE PK GYRUS HOLMIUM LASER; TRANSURETHRAL RESECTION OF BLADDER TUMOR (TURBT)  Patient Location: PACU  Anesthesia Type: General  Level of Consciousness: awake, alert  and oriented  Airway & Oxygen Therapy: Patient Spontanous Breathing and Patient connected to face mask oxygen  Post-op Assessment: Report given to PACU RN and Post -op Vital signs reviewed and stable  Post vital signs: Reviewed and stable  Complications: No apparent anesthesia complications

## 2011-10-30 NOTE — Anesthesia Procedure Notes (Signed)
Procedure Name: LMA Insertion Date/Time: 10/30/2011 8:35 AM Performed by: Wyvonna Plum Pre-anesthesia Checklist: Patient identified, Emergency Drugs available, Suction available and Patient being monitored Patient Re-evaluated:Patient Re-evaluated prior to inductionOxygen Delivery Method: Circle System Utilized Preoxygenation: Pre-oxygenation with 100% oxygen Intubation Type: IV induction Ventilation: Mask ventilation without difficulty LMA: LMA with gastric port inserted LMA Size: 4.0 Number of attempts: 1 Placement Confirmation: positive ETCO2 Tube secured with: Tape Dental Injury: Teeth and Oropharynx as per pre-operative assessment

## 2011-10-30 NOTE — Brief Op Note (Signed)
10/30/2011  9:52 AM  PATIENT:  [name]  77 y.o. male  PRE-OPERATIVE DIAGNOSIS:  Gross hematuria POST-OPERATIVE DIAGNOSIS: Bladder tumor  PROCEDURE:  Procedure(s): CYSTOSCOPY BILATERAL RETROGRADE PYELOGRAM TRANSURETHRAL RESECTION OF BLADDER TUMOR (>2cm and <5 cm)  SURGEON:  Surgeon(s): Molli Hazard, MD  PHYSICIAN ASSISTANT: None  ASSISTANTS: none   ANESTHESIA:   general  EBL:  Total I/O In: 1000 [I.V.:1000] Out: -   BLOOD ADMINISTERED:none  DRAINS: Urinary Catheter (Foley)   LOCAL MEDICATIONS USED:  NONE  SPECIMEN:  Source of Specimen:  bladder tumor  DISPOSITION OF SPECIMEN:  PATHOLOGY  COUNTS:  YES  TOURNIQUET:  * No tourniquets in log *  DICTATION: .Other Dictation: Dictation Number [id number]  PLAN OF CARE: Discharge to home after PACU  PATIENT DISPOSITION:  PACU - hemodynamically stable.   Delay start of Pharmacological VTE agent (>24hrs) due to surgical blood loss or risk of bleeding: Yes

## 2011-10-30 NOTE — Anesthesia Postprocedure Evaluation (Signed)
  Anesthesia Post-op Note  Patient: [name] Small  Procedure(s) Performed:  CYSTOSCOPY WITH RETROGRADE PYELOGRAM - CYSTOSCOPY POSS TURBT BILATERAL RETROGRADE PYLEOGRAM POSS BILATERAL URETEROSCOPY WITH BIOPSY LASER ABLATION OF LESION POSS BILATERAL URETERAL STENT PLACEMENT   CARM FLEXIBLE URETERAL SCOPE DIGITAL URETERAL SCOPE PK GYRUS HOLMIUM LASER; TRANSURETHRAL RESECTION OF BLADDER TUMOR (TURBT)  Patient Location: PACU  Anesthesia Type: General  Level of Consciousness: awake and alert   Airway and Oxygen Therapy: Patient Spontanous Breathing  Post-op Pain: mild  Post-op Assessment: Post-op Vital signs reviewed, Patient's Cardiovascular Status Stable, Respiratory Function Stable, Patent Airway and No signs of Nausea or vomiting  Post-op Vital Signs: stable  Complications: No apparent anesthesia complications

## 2011-10-30 NOTE — H&P (Signed)
Chief Complaint  Gross hematuria   History of Present Illness          77 year old male with gross hematuria. First occurrence April 2012. This was associated with blood clots. This resolved on its own. Next episode was 3 weeks ago. This was not associated with blood clots. It was painless. It was associated with urinary frequency. Nothing makes it better. Nothing makes it worse. No family history of prostate cancer, renal cancer, or bladder cancer.  He has stage III chronic kidney disease. His most recent serum creatinine on September 04, 2011 was 2.3. He also had a negative urine culture from this time. He had a non-contrast CT of the abdomen and pelvis. This showed no stones and no hydronephrosis. He did have a large prostate and bilateral renal atrophy.  He presents today for cystoscopy, bilateral retrograde pyelograms, possible transurethral resection of bladder tumor, possible bilateral ureteroscopy with biopsy and laser ablation of lesion, and possible ureteral stent placement. We discussed the risks, benefits, alternatives, and likelihood of achieving goals with this workup. The patient had the opportunity to ask questions to his stated satisfaction. He wishes to proceed with this.  Labs from his nephrologist's office were faxed to me this morning.  His serum potassium was 5.4 on 10/28/11; repeat potassium today on i-stat was 4.6.    Past Medical History Problems  1. History of  Diabetes Mellitus 250.00 2. History of  Hypertension 401.9 3. History of  Renal Disease 593.9  Surgical History Problems  1. History of  Appendectomy 2. History of  Tonsillectomy  Current Meds 1. Actos 45 MG Oral Tablet; Therapy: (Recorded:13Nov2012) to 2. Adult Aspirin Low Strength 81 MG Oral Tablet Dispersible; Therapy: (Recorded:13Nov2012) to 3. Allopurinol 100 MG Oral Tablet; Therapy: (Recorded:13Nov2012) to 4. GlipiZIDE 10 MG Oral Tablet; Therapy: (Recorded:13Nov2012) to 5. Januvia 50 MG Oral Tablet;  Therapy: (Recorded:13Nov2012) to 6. Lantus 100 UNIT/ML Subcutaneous Solution; Therapy: (Recorded:13Nov2012) to 7. Losartan Potassium 50 MG Oral Tablet; Therapy: (Recorded:13Nov2012) to 8. Sodium Polystyrene Sulfonate 15 GM/60ML Oral Suspension; Therapy: (Recorded:13Nov2012) to 9. Torsemide 10 MG Oral Tablet; Therapy: (Recorded:13Nov2012) to  Allergies Medication  1. ACE Inhibitors 2. Amoxicillin TABS 3. Levaquin TABS  Family History Problems  1. Paternal history of  Death In The Family Father 55yr 2. Maternal history of  Death In The Family Mother 93yrs 3. Maternal history of  Diabetes Mellitus V18.0 4. Family history of  Family Health Status Number Of Children 4 children  Social History Problems    Alcohol Use 1 q wk   Caffeine Use 2c qd   Former Smoker V15.82   Marital History - Currently Married   Occupation: retired Denied    History of  Tobacco Use nonsmoker for 67yrs  Review of [employer], otolaryngeal, endocrine, gastrointestinal, neurological and psychiatric system(s) were reviewed and pertinent findings if present are noted.  Constitutional: feeling tired (fatigue), but no fever, no night sweats and no recent weight loss.  Integumentary: skin rash/lesion and pruritus.  Hematologic/Lymphatic: a tendency to easily bruise, but no swollen glands.  Cardiovascular: leg swelling, but no chest pain.  Respiratory: shortness of breath and cough.  Musculoskeletal: joint pain, but no back pain.     Physical Exam Constitutional: Well nourished and well developed.  ENT:. The ears and nose are normal in appearance. The oropharynx is normal.  Neck: The appearance of the neck is normal and no neck mass is present.  Pulmonary: No respiratory distress and normal respiratory rhythm and effort.  Cardiovascular: Heart  rate and rhythm are normal . No peripheral edema.  Abdomen: The abdomen is soft and nontender. No masses are palpated. The abdomen is no rebound. No CVA  tenderness.  Lymphatics: The posterior cervical and supraclavicular nodes are not enlarged or tender.  Skin: Normal skin turgor and no visible rash.  Neuro/Psych:. Mood and affect are appropriate. No focal sensory deficits.     Assessment Gross Hematuria   Plan Procedure operating room for cystoscopy, possible transurethral resection of bladder tumor, bilateral retrograde pyelogram, possible bilateral ureteroscopy with biopsy and laser ablation of lesion, possible bilateral ureteral stent placement.

## 2011-10-31 ENCOUNTER — Encounter (HOSPITAL_BASED_OUTPATIENT_CLINIC_OR_DEPARTMENT_OTHER): Payer: Self-pay | Admitting: Urology

## 2011-10-31 NOTE — Op Note (Signed)
NAMEDECODA, [name] Small NO.:  [id number]  MEDICAL RECORD NO.:  [id number]  LOCATION:                               FACILITY:  [facility]  PHYSICIAN:  [name], MD    DATE OF BIRTH:  December 25, 1927  DATE OF PROCEDURE:  10/30/2011 DATE OF DISCHARGE:                              OPERATIVE REPORT   SURGEON:  [name], MD  ASSISTANT:  None.  PREOPERATIVE DIAGNOSIS:  Gross hematuria.  POSTOPERATIVE DIAGNOSIS:  Bladder tumor.  PROCEDURE PERFORMED:  Cystoscopy, bilateral retrograde pyelogram, and transurethral resection of bladder tumor, larger than 2 cm but less than 5 cm in size.  ESTIMATED BLOOD LOSS:  Minimal.  SPECIMEN:  Bladder tumor sent from the left periureteral area and then 2 other specimens from the right bladder dome.  FINDINGS:  Bladder tumor around the left ureter and on the right bladder dome, larger than 2 cm, less than 5 cm in size.  Bilateral retrograde pyelograms showed no filling defects bilaterally.  HISTORY OF PRESENT ILLNESS:  This is an 77 year old male who had begun having gross hematuria in April 2012.  He presented to see me in late November and I recommended coming to the operating room for bilateral retrograde pyelogram and cystoscopy due to the fact that he has renal insufficiency and is unable to handle contrast with the CT scan.  The patient presented today for that procedure.  PROCEDURE:  After informed consent was obtained, the patient was taken to the operating room where he was placed in supine position, IV antibiotics were infused, and general anesthesia was induced.  He was then placed in a dorsal lithotomy position.  All pertinent neurovascular pressure points were padded appropriately.  SCDs were in place and turned on.  A time-out was performed in which the correct patient, surgical site, and procedure were identified and agreed upon by the team.  His genitals were then prepped and draped in a usual  sterile fashion.  He has a malleable penile prosthesis.  A rigid cystoscope was advanced through the urethra and the bladder.  The bladder was evaluated in a systematic fashion with a 30-degree and 70-degree cystoscope. There was noted to be bladder tumor around the left ureteral orifice and bladder tumor on the right bladder dome.  Retrograde pyelogram was used by cannulating the left ureteral orifice with a 6-French open-ended Pollack catheter and retrograde pyelogram was obtained.  There was no filling defect in the upper urinary tract or the ureter.  This ureter drained out well.  Next, the 6-French open-ended Pollack catheter was used to cannulate the right ureteral orifice and a retrograde pyelogram was obtained and again, there was no filling defect in the renal collecting system or ureter.  This side also emptied out well.  Next, a visual obturator to the gyrus resectoscope was placed through the urethra and resection was carried out in normal saline.  I was able to save the ureteral orifice on the left side and a cold cup biopsy forceps was used to take a deep biopsy of this area.  Fulguration was then carried out to maintain hemostasis.  Attention was then turned to the right bladder dome.  Because of this penile prosthesis, it was a difficult resection as this was located at the very farthest reach of his dome and the penile prosthesis prohibited me from placing the resectoscope in as far as I would like to have.  A resection was carried out and all of the bladder tumor that was visible was removed.  A cold cup biopsy forceps was taken of the deep tissue and it appeared grossly that resection was taken into the muscle.  There was no perforation noted.  Fulguration was then carried out.  Because of the vascularity of the bladder tumor, I did place an 18-French Foley catheter with 10 cc sterile water into the balloon to drain the bladder.  These specimen were all sent for  permanent pathology.  Patient was then placed back in a supine position.  Anesthesia was reversed and he was taken to the PACU in stable condition.  We will follow up with the final pathology results and make recommendations from there.          ______________________________ [name], MD     DW/MEDQ  D:  10/30/2011  T:  10/30/2011  Job:  [id number]

## 2011-11-19 ENCOUNTER — Telehealth: Payer: Self-pay | Admitting: Family Medicine

## 2011-11-19 ENCOUNTER — Other Ambulatory Visit (INDEPENDENT_AMBULATORY_CARE_PROVIDER_SITE_OTHER): Payer: Medicare Other

## 2011-11-19 DIAGNOSIS — E119 Type 2 diabetes mellitus without complications: Secondary | ICD-10-CM

## 2011-11-19 DIAGNOSIS — E875 Hyperkalemia: Secondary | ICD-10-CM

## 2011-11-19 LAB — COMPREHENSIVE METABOLIC PANEL
ALT: 11 U/L (ref 0–53)
AST: 15 U/L (ref 0–37)
Albumin: 4 g/dL (ref 3.5–5.2)
Alkaline Phosphatase: 47 U/L (ref 39–117)
BUN: 34 mg/dL — ABNORMAL HIGH (ref 6–23)
CO2: 33 mEq/L — ABNORMAL HIGH (ref 19–32)
Calcium: 8.9 mg/dL (ref 8.4–10.5)
Chloride: 103 mEq/L (ref 96–112)
Creatinine, Ser: 2 mg/dL — ABNORMAL HIGH (ref 0.4–1.5)
GFR: 35.06 mL/min — ABNORMAL LOW (ref >60.00)
Glucose, Bld: 118 mg/dL — ABNORMAL HIGH (ref 70–99)
Potassium: 5.6 mEq/L — ABNORMAL HIGH (ref 3.5–5.1)
Sodium: 142 mEq/L (ref 135–145)
Total Bilirubin: 0.7 mg/dL (ref 0.3–1.2)
Total Protein: 7.6 g/dL (ref 6.0–8.3)

## 2011-11-19 LAB — LIPID PANEL
Cholesterol: 189 mg/dL (ref 0–200)
HDL: 43.1 mg/dL (ref >39.00)
LDL Cholesterol: 129 mg/dL — ABNORMAL HIGH (ref 0–99)
Total CHOL/HDL Ratio: 4
Triglycerides: 86 mg/dL (ref 0.0–149.0)
VLDL: 17.2 mg/dL (ref 0.0–40.0)

## 2011-11-19 LAB — HEMOGLOBIN A1C: Hgb A1c MFr Bld: 7.3 % — ABNORMAL HIGH (ref 4.6–6.5)

## 2011-11-19 NOTE — Telephone Encounter (Signed)
K was up.  Have pt take an extra dose of sodium polystyrene/kayexalate today and recheck K on Thursday.  Orders are in for repeat K.  Thanks.  I d/w pt about other labs at the [facility].

## 2011-11-20 NOTE — Telephone Encounter (Signed)
Patient advised.

## 2011-11-22 ENCOUNTER — Other Ambulatory Visit: Payer: Self-pay | Admitting: Family Medicine

## 2011-11-22 ENCOUNTER — Other Ambulatory Visit: Payer: Self-pay | Admitting: *Deleted

## 2011-11-22 ENCOUNTER — Other Ambulatory Visit (INDEPENDENT_AMBULATORY_CARE_PROVIDER_SITE_OTHER): Payer: Medicare Other

## 2011-11-22 DIAGNOSIS — E875 Hyperkalemia: Secondary | ICD-10-CM

## 2011-11-22 LAB — POTASSIUM: Potassium: 5.2 mEq/L — ABNORMAL HIGH (ref 3.5–5.1)

## 2011-11-22 MED ORDER — SODIUM POLYSTYRENE SULFONATE 15 GM/60ML PO SUSP: 15.0000 g | ORAL | Status: DC | PRN

## 2011-11-26 ENCOUNTER — Other Ambulatory Visit: Payer: Self-pay | Admitting: *Deleted

## 2011-11-26 ENCOUNTER — Ambulatory Visit (INDEPENDENT_AMBULATORY_CARE_PROVIDER_SITE_OTHER): Payer: Medicare Other | Admitting: Family Medicine

## 2011-11-26 ENCOUNTER — Encounter: Payer: Self-pay | Admitting: Family Medicine

## 2011-11-26 VITALS — BP 142/60 | HR 77 | Temp 97.5°F | Wt 218.0 lb

## 2011-11-26 DIAGNOSIS — E11319 Type 2 diabetes mellitus with unspecified diabetic retinopathy without macular edema: Secondary | ICD-10-CM

## 2011-11-26 DIAGNOSIS — R252 Cramp and spasm: Secondary | ICD-10-CM

## 2011-11-26 DIAGNOSIS — E119 Type 2 diabetes mellitus without complications: Secondary | ICD-10-CM

## 2011-11-26 DIAGNOSIS — E1139 Type 2 diabetes mellitus with other diabetic ophthalmic complication: Secondary | ICD-10-CM

## 2011-11-26 DIAGNOSIS — E875 Hyperkalemia: Secondary | ICD-10-CM

## 2011-11-26 DIAGNOSIS — C679 Malignant neoplasm of bladder, unspecified: Secondary | ICD-10-CM

## 2011-11-26 MED ORDER — GLIPIZIDE ER 10 MG PO TB24: 10.0000 mg | ORAL_TABLET | Freq: Every day | ORAL | Status: DC

## 2011-11-26 MED ORDER — ALLOPURINOL 100 MG PO TABS: 100.0000 mg | ORAL_TABLET | Freq: Every day | ORAL | Status: DC

## 2011-11-26 MED ORDER — SODIUM POLYSTYRENE SULFONATE 15 GM/60ML PO SUSP: 15.0000 g | ORAL | Status: DC | PRN

## 2011-11-26 MED ORDER — LOSARTAN POTASSIUM 50 MG PO TABS: 50.0000 mg | ORAL_TABLET | Freq: Every day | ORAL | Status: DC

## 2011-11-26 MED ORDER — TORSEMIDE 10 MG PO TABS: ORAL_TABLET | ORAL | Status: DC

## 2011-11-26 NOTE — Patient Instructions (Signed)
Take 3 doses of kayexalate over the next week and then recheck potassium.  Limit to 1 shot a day of insulin.  If AM sugar is <79, decrease by 1 unit that day.  If AM sugar is >121, increase by 1 unit that day.  If 80-120, no change.  Recheck A1c in 3 months with OV a few days later.

## 2011-11-26 NOTE — Progress Notes (Signed)
Recheck pulse ox 94-95% on RA.    Was unable to get Kayexalate until today.  Will take 3 doses and then recheck K. D/w pt about low K diet.    Bladder cancer per Dr. Jasmine December. D/w pt today.    DM2.  A1c improved.  D/w pt about adjusting lantus dose. Sugar 116 this AM.  Off actos. No ADE from current meds.  Chol okay except for LDL, but lipids are tabled for now in light of other issues.  His was A1c improved to 7.3.  Occ leg cramps with distance walking but o/w feeling well.      PMH and SH reviewed  Meds, vitals, and allergies reviewed.   ROS: See HPI.  Otherwise negative.    GEN: nad, alert and oriented HEENT: mucous membranes moist, tm wnl, Op wnl, UAN noise noted NECK: supple w/o LA CV: rrr. PULM: ctab, no inc wob ABD: soft, +bs EXT: no edema SKIN: no acute rash  Diabetic foot exam: Normal inspection No skin breakdown No calluses  1+ DP pulses Dec sensation to light touch and monofilament Nails normal

## 2011-11-27 ENCOUNTER — Encounter: Payer: Self-pay | Admitting: Family Medicine

## 2011-11-27 DIAGNOSIS — E875 Hyperkalemia: Secondary | ICD-10-CM | POA: Insufficient documentation

## 2011-11-27 NOTE — Assessment & Plan Note (Signed)
>  25 min spent with face to face with patient, >50% counseling and/or coordinating care.  A1c improved, continue to titrate lantus dose based on Am sugar.  He agrees.

## 2011-11-27 NOTE — Assessment & Plan Note (Signed)
Likely claudication.  Gradually inc exercise and continue risk factor modification. D/w pt. He doesn't appear to need vasc referral at this time.  Will follow clinically.

## 2011-11-27 NOTE — Assessment & Plan Note (Signed)
Per uro.

## 2011-11-27 NOTE — Assessment & Plan Note (Signed)
Will take kayexate and then return for repeat K level.  D/w pt about low K diet.

## 2011-11-29 ENCOUNTER — Other Ambulatory Visit: Payer: Medicare Other

## 2011-12-06 ENCOUNTER — Ambulatory Visit (INDEPENDENT_AMBULATORY_CARE_PROVIDER_SITE_OTHER): Payer: Medicare Other | Admitting: Ophthalmology

## 2011-12-08 ENCOUNTER — Encounter: Payer: Self-pay | Admitting: Family Medicine

## 2011-12-09 ENCOUNTER — Other Ambulatory Visit: Payer: Self-pay | Admitting: Family Medicine

## 2011-12-09 ENCOUNTER — Other Ambulatory Visit: Payer: Medicare Other

## 2011-12-09 ENCOUNTER — Other Ambulatory Visit (INDEPENDENT_AMBULATORY_CARE_PROVIDER_SITE_OTHER): Payer: Medicare Other

## 2011-12-09 DIAGNOSIS — E875 Hyperkalemia: Secondary | ICD-10-CM

## 2011-12-09 DIAGNOSIS — E119 Type 2 diabetes mellitus without complications: Secondary | ICD-10-CM

## 2011-12-09 LAB — HEMOGLOBIN A1C: Hgb A1c MFr Bld: 7.8 % — ABNORMAL HIGH (ref 4.6–6.5)

## 2011-12-09 LAB — POTASSIUM: Potassium: 4.1 mEq/L (ref 3.5–5.1)

## 2011-12-19 ENCOUNTER — Other Ambulatory Visit: Payer: Self-pay | Admitting: Urology

## 2011-12-19 ENCOUNTER — Encounter (HOSPITAL_BASED_OUTPATIENT_CLINIC_OR_DEPARTMENT_OTHER): Payer: Self-pay | Admitting: *Deleted

## 2011-12-19 NOTE — Progress Notes (Addendum)
Pt instructed npo p mn 2/12.  To [facility] 12/25/11 @ 0715.  Needs istat on arrival. Cxr, ekg w chart. Last office note requested from dr. Winnifred Friar office. Pt refused [facility] contact number when offered.  Pt stated, " I don't need it, I'll be there".

## 2011-12-25 ENCOUNTER — Encounter (HOSPITAL_BASED_OUTPATIENT_CLINIC_OR_DEPARTMENT_OTHER): Payer: Self-pay | Admitting: *Deleted

## 2011-12-25 ENCOUNTER — Encounter (HOSPITAL_BASED_OUTPATIENT_CLINIC_OR_DEPARTMENT_OTHER)
Admission: RE | Disposition: A | Discharge status: Home / Self Care | Payer: Self-pay | Source: Ambulatory Visit | Attending: Urology

## 2011-12-25 ENCOUNTER — Ambulatory Visit (HOSPITAL_BASED_OUTPATIENT_CLINIC_OR_DEPARTMENT_OTHER): Payer: Medicare Other | Admitting: Anesthesiology

## 2011-12-25 ENCOUNTER — Encounter (HOSPITAL_BASED_OUTPATIENT_CLINIC_OR_DEPARTMENT_OTHER): Payer: Self-pay | Admitting: Anesthesiology

## 2011-12-25 ENCOUNTER — Other Ambulatory Visit: Payer: Self-pay | Admitting: Urology

## 2011-12-25 ENCOUNTER — Ambulatory Visit (HOSPITAL_BASED_OUTPATIENT_CLINIC_OR_DEPARTMENT_OTHER)
Admission: RE | Admit: 2011-12-25 | Discharge: 2011-12-25 | Disposition: A | Discharge status: Home / Self Care | Payer: Medicare Other | Source: Ambulatory Visit | Attending: Urology | Admitting: Urology

## 2011-12-25 DIAGNOSIS — I129 Hypertensive chronic kidney disease with stage 1 through stage 4 chronic kidney disease, or unspecified chronic kidney disease: Secondary | ICD-10-CM | POA: Insufficient documentation

## 2011-12-25 DIAGNOSIS — N183 Chronic kidney disease, stage 3 unspecified: Secondary | ICD-10-CM | POA: Insufficient documentation

## 2011-12-25 DIAGNOSIS — N3020 Other chronic cystitis without hematuria: Secondary | ICD-10-CM | POA: Insufficient documentation

## 2011-12-25 DIAGNOSIS — N400 Enlarged prostate without lower urinary tract symptoms: Secondary | ICD-10-CM | POA: Insufficient documentation

## 2011-12-25 DIAGNOSIS — I2510 Atherosclerotic heart disease of native coronary artery without angina pectoris: Secondary | ICD-10-CM | POA: Insufficient documentation

## 2011-12-25 DIAGNOSIS — C679 Malignant neoplasm of bladder, unspecified: Secondary | ICD-10-CM

## 2011-12-25 DIAGNOSIS — E119 Type 2 diabetes mellitus without complications: Secondary | ICD-10-CM | POA: Insufficient documentation

## 2011-12-25 DIAGNOSIS — N3000 Acute cystitis without hematuria: Secondary | ICD-10-CM | POA: Insufficient documentation

## 2011-12-25 HISTORY — PX: TRANSURETHRAL RESECTION OF BLADDER TUMOR: SHX2575

## 2011-12-25 HISTORY — PX: CYSTOSCOPY: SHX5120

## 2011-12-25 LAB — POCT I-STAT 4, (NA,K, GLUC, HGB,HCT)
Glucose, Bld: 134 mg/dL — ABNORMAL HIGH (ref 70–99)
HCT: 45 % (ref 39.0–52.0)
Hemoglobin: 15.3 g/dL (ref 13.0–17.0)
Potassium: 4.4 mEq/L (ref 3.5–5.1)
Sodium: 140 mEq/L (ref 135–145)

## 2011-12-25 LAB — GLUCOSE, CAPILLARY: Glucose-Capillary: 151 mg/dL — ABNORMAL HIGH (ref 70–99)

## 2011-12-25 SURGERY — CYSTOSCOPY
Anesthesia: General | Site: Bladder | Wound class: Clean Contaminated

## 2011-12-25 MED ORDER — FENTANYL CITRATE 0.05 MG/ML IJ SOLN
INTRAMUSCULAR | Status: DC | PRN
  Administered 2011-12-25 (×2): 25 ug via INTRAVENOUS
  Administered 2011-12-25: 50 ug via INTRAVENOUS
  Administered 2011-12-25 (×2): 25 ug via INTRAVENOUS

## 2011-12-25 MED ORDER — EPHEDRINE SULFATE 50 MG/ML IJ SOLN
INTRAMUSCULAR | Status: DC | PRN
  Administered 2011-12-25: 15 mg via INTRAVENOUS
  Administered 2011-12-25: 10 mg via INTRAVENOUS

## 2011-12-25 MED ORDER — LIDOCAINE HCL (CARDIAC) 20 MG/ML IV SOLN
INTRAVENOUS | Status: DC | PRN
  Administered 2011-12-25: 80 mg via INTRAVENOUS

## 2011-12-25 MED ORDER — ONDANSETRON HCL 4 MG/2ML IJ SOLN
INTRAMUSCULAR | Status: DC | PRN
  Administered 2011-12-25: 4 mg via INTRAVENOUS

## 2011-12-25 MED ORDER — BELLADONNA ALKALOIDS-OPIUM 16.2-60 MG RE SUPP
RECTAL | Status: DC | PRN
  Administered 2011-12-25: 1 via RECTAL

## 2011-12-25 MED ORDER — LACTATED RINGERS IV SOLN
INTRAVENOUS | Status: DC
  Administered 2011-12-25 (×3): via INTRAVENOUS

## 2011-12-25 MED ORDER — GENTAMICIN IN SALINE 1.6-0.9 MG/ML-% IV SOLN: 80.0000 mg | INTRAVENOUS | Status: DC

## 2011-12-25 MED ORDER — SODIUM CHLORIDE 0.9 % IR SOLN
Status: DC | PRN
  Administered 2011-12-25: 9000 mL

## 2011-12-25 MED ORDER — CIPROFLOXACIN HCL 500 MG PO TABS: 500.0000 mg | ORAL_TABLET | Freq: Two times a day (BID) | ORAL | Status: AC

## 2011-12-25 MED ORDER — STERILE WATER FOR IRRIGATION IR SOLN
Status: DC | PRN
  Administered 2011-12-25: 5 mL

## 2011-12-25 MED ORDER — HYDROCODONE-ACETAMINOPHEN 5-325 MG PO TABS: 1.0000 | ORAL_TABLET | ORAL | Status: AC | PRN

## 2011-12-25 MED ORDER — PROPOFOL 10 MG/ML IV EMUL
INTRAVENOUS | Status: DC | PRN
  Administered 2011-12-25 (×3): 10 mg via INTRAVENOUS
  Administered 2011-12-25: 200 mg via INTRAVENOUS
  Administered 2011-12-25: 10 mg via INTRAVENOUS

## 2011-12-25 MED ORDER — FENTANYL CITRATE 0.05 MG/ML IJ SOLN: 25.0000 ug | INTRAMUSCULAR | Status: DC | PRN

## 2011-12-25 SURGICAL SUPPLY — 41 items
BAG DRAIN URO-CYSTO SKYTR STRL (DRAIN) ×2 IMPLANT
BAG DRN ANRFLXCHMBR STRAP LEK (BAG) ×1
BAG DRN UROCATH (DRAIN) ×1
BAG URINE DRAINAGE (UROLOGICAL SUPPLIES) ×1 IMPLANT
BAG URINE LEG 19OZ MD ST LTX (BAG) ×1 IMPLANT
CANISTER SUCT LVC 12 LTR MEDI- (MISCELLANEOUS) ×2 IMPLANT
CATH COUDE FOLEY 2W 5CC 18FR (CATHETERS) ×1 IMPLANT
CATH FOLEY 2WAY SLVR  5CC 20FR (CATHETERS)
CATH FOLEY 2WAY SLVR  5CC 22FR (CATHETERS)
CATH FOLEY 2WAY SLVR  5CC 24FR (CATHETERS) ×1
CATH FOLEY 2WAY SLVR 5CC 20FR (CATHETERS) IMPLANT
CATH FOLEY 2WAY SLVR 5CC 22FR (CATHETERS) IMPLANT
CATH FOLEY 2WAY SLVR 5CC 24FR (CATHETERS) ×1 IMPLANT
CATH HEMA 3WAY 30CC 24FR COUDE (CATHETERS) ×1 IMPLANT
CATH HEMA 3WAY 30CC 24FR RND (CATHETERS) IMPLANT
CLOTH BEACON ORANGE TIMEOUT ST (SAFETY) ×2 IMPLANT
DRAPE CAMERA CLOSED 9X96 (DRAPES) ×2 IMPLANT
ELECT BUTTON BIOP 24F 90D PLAS (MISCELLANEOUS) IMPLANT
ELECT BUTTON HF 24-28F 2 30DE (ELECTRODE) IMPLANT
ELECT LOOP MED HF 24F 12D (CUTTING LOOP) ×2 IMPLANT
ELECT LOOP MED HF 24F 12D CBL (CLIP) IMPLANT
ELECT REM PT RETURN 9FT ADLT (ELECTROSURGICAL) ×2
ELECTRODE REM PT RTRN 9FT ADLT (ELECTROSURGICAL) ×1 IMPLANT
EVACUATOR MICROVAS BLADDER (UROLOGICAL SUPPLIES) IMPLANT
GLOVE BIO SURGEON STRL SZ8 (GLOVE) ×2 IMPLANT
GLOVE ECLIPSE 7.0 STRL STRAW (GLOVE) ×2 IMPLANT
GLOVE INDICATOR 7.5 STRL GRN (GLOVE) ×2 IMPLANT
GOWN PREVENTION PLUS LG XLONG (DISPOSABLE) ×2 IMPLANT
GOWN STRL REIN XL XLG (GOWN DISPOSABLE) ×2 IMPLANT
HOLDER FOLEY CATH W/STRAP (MISCELLANEOUS) ×1 IMPLANT
IV NS IRRIG 3000ML ARTHROMATIC (IV SOLUTION) ×6 IMPLANT
KIT ASPIRATION TUBING (SET/KITS/TRAYS/PACK) ×1 IMPLANT
LOOP ELECTRODE MEDIUM (ELECTRODE) ×1 IMPLANT
NEEDLE HYPO 22GX1.5 SAFETY (NEEDLE) IMPLANT
NS IRRIG 500ML POUR BTL (IV SOLUTION) IMPLANT
PACK CYSTOSCOPY (CUSTOM PROCEDURE TRAY) ×2 IMPLANT
PLUG CATH AND CAP STER (CATHETERS) IMPLANT
RESECTION LOOP 24FR 0.2 WIR IMPLANT
SET ASPIRATION TUBING (TUBING) IMPLANT
WATER STERILE IRR 3000ML UROMA (IV SOLUTION) ×1 IMPLANT
WATER STERILE IRR 500ML POUR (IV SOLUTION) ×1 IMPLANT

## 2011-12-25 NOTE — Discharge Instructions (Signed)
DISCHARGE INSTRUCTIONS FOR TRANSURETHRAL SURGERY OF BLADDER  MEDICATIONS:   1. DO NOT RESUME YOUR ANY BLOOD THINNERS FOR ONE WEEK.  2. Resume all your other meds from home.  3. If you were taking finasteride and/or tamsulosin, you should continue these medications.  ACTIVITY 1. No heavy lifting >10 pounds for 2 weeks 2. No sexual activity for 2 weeks 3. No strenuous activity for 2 weeks 4. No driving while on narcotic pain medications 5. Drink plenty of water 6. Continue to walk at home - you can still get blood clots when you are at  home, so keep active, but don't over do it. 7. Your urine may have some blood in it - make sure you drink plenty of water,  call or come to the ER immediately if your catheter stops draining  FOLEY CATHETER  (If you go home with a catheter in place.) 1. Make sure your catheter is attached to your leg at all times - do not let  anything pull on it 2. If the urine is your tube starts looking dark red or if it stops draining,  call us immediately or come to the ER 3. Drink plenty of water, if you do notice your urine looking darking, sit down,  relax and drink lots of water 4. You will be given a leg bag as well as an overnight bag for your catheter -  MAKE SURE ATTACHED TO YOUR LEG AT ALL TIMES WITH TAPE OR LEG STRAP  BATHING 1. You can shower.  Do not take baths.  SIGNS/SYMPTOMS TO CALL: 1. Please call us if you have a fever greater than 101.5, uncontrolled  nausea/vomiting, uncontrolled pain, dizziness, unable to urinate, chest pain, shortness of breath, leg swelling, leg pain, or any other concerns  or questions.  You can reach [place] at [phone].

## 2011-12-25 NOTE — Anesthesia Postprocedure Evaluation (Signed)
  Anesthesia Post-op Note  Patient: [name] Small  Procedure(s) Performed: Procedure(s) (LRB): CYSTOSCOPY (N/A) TRANSURETHRAL RESECTION OF BLADDER TUMOR (TURBT) (N/A)  Patient Location: PACU  Anesthesia Type: General  Level of Consciousness: awake and alert   Airway and Oxygen Therapy: Patient Spontanous Breathing  Post-op Pain: mild  Post-op Assessment: Post-op Vital signs reviewed, Patient's Cardiovascular Status Stable, Respiratory Function Stable, Patent Airway and No signs of Nausea or vomiting  Post-op Vital Signs: stable  Complications: No apparent anesthesia complications

## 2011-12-25 NOTE — Progress Notes (Signed)
Dentures returned-Rx's  given to wife to take to pharmacy.

## 2011-12-25 NOTE — Anesthesia Preprocedure Evaluation (Signed)
Anesthesia Evaluation  Patient identified by MRN, date of birth, ID band Patient awake    Reviewed: Allergy & Precautions, H&P , NPO status , Patient's Chart, lab work & pertinent test results  Airway Mallampati: II TM Distance: >3 FB Neck ROM: Full    Dental No notable dental hx.    Pulmonary shortness of breath and with exertion,  clear to auscultation  Pulmonary exam normal       Cardiovascular hypertension, + CAD Regular Normal    Neuro/Psych Negative Neurological ROS  Negative Psych ROS   GI/Hepatic negative GI ROS, Neg liver ROS,   Endo/Other  Diabetes mellitus-, Type 2, Insulin Dependent  Renal/GU Renal Insufficiency and CRFRenal disease  Genitourinary negative   Musculoskeletal negative musculoskeletal ROS (+)   Abdominal   Peds negative pediatric ROS (+)  Hematology negative hematology ROS (+)   Anesthesia Other Findings   Reproductive/Obstetrics negative OB ROS                           Anesthesia Physical Anesthesia Plan  ASA: III  Anesthesia Plan: General   Post-op Pain Management:    Induction: Intravenous  Airway Management Planned: LMA  Additional Equipment:   Intra-op Plan:   Post-operative Plan: Extubation in OR  Informed Consent: I have reviewed the patients History and Physical, chart, labs and discussed the procedure including the risks, benefits and alternatives for the proposed anesthesia with the patient or authorized representative who has indicated his/her understanding and acceptance.   Dental advisory given  Plan Discussed with: CRNA  Anesthesia Plan Comments: (LMA supreme 4 on 12/12)        Anesthesia Quick Evaluation

## 2011-12-25 NOTE — H&P (Signed)
Urology History and Physical Exam  CC: Bladder cancer  HPI:     78 year old patient who has high-grade, non-muscle invasive bladder cancer resected with TURBT 10/30/11.  He presents today for repeat resection to ensure completelness of resection and for staging purposes.  He had bilateral retrograde pyelograms which showed no filling defects at his last TURBT.  We have discussed the risks, benefits, alternatives, and likelihood of achieving his goals.  Urine culture 11/06/11 negative for infection.   PMH: Past Medical History  Diagnosis Date  . BENIGN PROSTATIC HYPERTROPHY 05/21/2007  . DIABETES MELLITUS, TYPE II 05/21/2007  . DIVERTICULOSIS, COLON 05/21/2007    pt denies  . HYPERTENSION 05/21/2007  . PSORIASIS, SCALP 10/25/2008  . Cellulitis of left foot     Hospitalized in 2006. denies currently  . Gross hematuria     pt denies  . Chronic kidney disease (CKD), stage III (moderate) 12/04/2007    FOLLOWED BY DR PATEL-- LAST VISIT 3 MON. AGO  . Psoriasis ELBOWS  . Fatigue AGE-RELATED  . Arthritis   . CAD (coronary artery disease) CARDIOLOGIST- DR WALL - VISIT IN JUNE 2012 FOR CATH    nonobstructive by cath 6/12:  mid LAD 30%, proximal obtuse marginal-2 30%, proximal RCA 20%, mid RCA 30-40%.  He had normal cardiac output and mildly elevated filling pressures but no significant pulmonary hypertension;   Echocardiogram in May 2012 demonstrated EF 50-55% and left atrial enlargement   . Impaired hearing BILATERAL HEARING AIDS  . Bladder cancer     2013    PSH: Past Surgical History  Procedure Date  . Vasectomy 15 YRS AGO  W/ ANES.  Marland Kitchen Penile prosthesis implant 15 YRS AGO  . Cardiovascular stress test 2007  . Cardiac catheterization 04-24-11/  DR MUHAMMAD ARIDA    MILD NONOBSTRUCTIVE CAD, NORMA CARDIAC OUTPUT  . Appendectomy AGE 51  . Tonsillectomy and adenoidectomy AGE 57 YRS AGO  . Cataract extraction w/ intraocular lens  implant, bilateral   . Incision and drainage foot LEFT FOOT  DUE TO INFECTION FROM  NAIL PUNCTURE INJURY  . Cystoscopy w/ retrogrades 10/30/2011    Procedure: CYSTOSCOPY WITH RETROGRADE PYELOGRAM;  Surgeon: Molli Hazard, MD;  Location: [facility];  Service: Urology;  Laterality: Bilateral;  CYSTOSCOPY POSS TURBT BILATERAL RETROGRADE PYLEOGRAM POSS BILATERAL URETEROSCOPY WITH BIOPSY LASER ABLATION OF LESION POSS BILATERAL URETERAL STENT PLACEMENT   CARM FLEXIBLE URETERAL SCOPE DIGITAL URETERAL SCOPE PK GYRUS HO  . Transurethral resection of bladder tumor 10/30/2011    Procedure: TRANSURETHRAL RESECTION OF BLADDER TUMOR (TURBT);  Surgeon: Molli Hazard, MD;  Location: [facility];  Service: Urology;  Laterality: N/A;    Allergies: Allergies  Allergen Reactions  . Ace Inhibitors     cough  . Actos (Pioglitazone Hydrochloride)     Held 2012 bladder cancer  . Aspirin     Held 2012 due to hematuria    Medications: No prescriptions prior to admission     Social History: History   Social History  . Marital Status: Married    Spouse Name: N/A    Number of Children: N/A  . Years of Education: N/A   Occupational History  . Not on file.   Social History Main Topics  . Smoking status: Former Smoker    Quit date: 11/11/1968  . Smokeless tobacco: Never Used  . Alcohol Use: 1.2 oz/week    2 Glasses of wine per week     Rare  .  Drug Use: No  . Sexually Active: Not on file   Other Topics Concern  . Not on file   Social History Narrative   Retired Event organiser.Active with golf.    Family History: Family History  Problem Relation Age of Onset  . Diabetes Mother   . Heart disease Brother   . Heart disease Brother   . Heart disease Brother   . Heart disease Brother   . Heart disease Brother   . Heart disease Brother     Review of Systems: Positive: None Negative: Chest pain, SOB, constipation.  A further 10 point review of systems was negative except what is listed in the  HPI.  Physical Exam:  General: No acute distress.  Awake. Head:  Normocephalic.  Atraumatic. ENT:  EOMI.  Mucous membranes moist Neck:  Supple.  No lymphadenopathy. CV:  S1 present. S2 present. Regular rate. Pulmonary: Equal effort bilaterally.  Clear to auscultation bilaterally. Abdomen: Soft.  Non- tender to palpation. Skin:  Normal turgor.  No visible rash. Extremity: No gross deformity of bilateral upper extremities.  No gross deformity of    bilateral lower extremities. Neurologic: Alert. Appropriate mood.    Studies:  No results found for this basename: HGB:2,WBC:2,PLT:2 in the last 72 hours  No results found for this basename: NA:2,K:2,CL:2,CO2:2,BUN:2,CREATININE:2,CALCIUM:2,MAGNESIUM:2,GFRNONAA:2,GFRAA:2 in the last 72 hours   No results found for this basename: PT:2,INR:2,APTT:2 in the last 72 hours   No components found with this basename: ABG:2    Assessment:  Bladder cancer  Plan:  To OR for repeat TURBT.

## 2011-12-25 NOTE — Brief Op Note (Signed)
12/25/2011  9:33 AM  PATIENT:  [name] Small  78 y.o. male  PRE-OPERATIVE DIAGNOSIS:  bladder cancer  POST-OPERATIVE DIAGNOSIS:  bladder cancer  PROCEDURE:  Procedure(s) (LRB): CYSTOSCOPY (N/A) TRANSURETHRAL RESECTION OF BLADDER TUMOR (TURBT) (N/A)  SURGEON:  Surgeon(s) and Role:    * Molli Hazard, MD - Primary  PHYSICIAN ASSISTANT:   ASSISTANTS: none   ANESTHESIA:   general  EBL:  Total I/O In: 1000 [I.V.:1000] Out: -   BLOOD ADMINISTERED:none  DRAINS: Urinary Catheter (Foley)   LOCAL MEDICATIONS USED:  NONE  SPECIMEN:  Source of Specimen:  bladder  DISPOSITION OF SPECIMEN:  PATHOLOGY  COUNTS:  YES  TOURNIQUET:  * No tourniquets in log *  DICTATION: .Other Dictation: Dictation Number [id number]  PLAN OF CARE: Discharge to home after PACU  PATIENT DISPOSITION:  PACU - hemodynamically stable.   Delay start of Pharmacological VTE agent (>24hrs) due to surgical blood loss or risk of bleeding: yes

## 2011-12-25 NOTE — Transfer of Care (Signed)
Immediate Anesthesia Transfer of Care Note  Patient: [name] Small  Procedure(s) Performed: Procedure(s) (LRB): CYSTOSCOPY (N/A) TRANSURETHRAL RESECTION OF BLADDER TUMOR (TURBT) (N/A)  Patient Location: Patient transported to PACU with oxygen via face mask at 4 Liters / Min  Anesthesia Type: General  Level of Consciousness: awake and alert   Airway & Oxygen Therapy: Patient Spontanous Breathing and Patient connected to face mask oxygen  Post-op Assessment: Report given to PACU RN and Post -op Vital signs reviewed and stable  Post vital signs: Reviewed and stable  Dentition: Teeth and oropharynx remain in pre-op condition  Complications: No apparent anesthesia complications

## 2011-12-25 NOTE — Anesthesia Procedure Notes (Signed)
Procedure Name: LMA Insertion Date/Time: 12/25/2011 8:27 AM Performed by: Edwyna Perfect Pre-anesthesia Checklist: Patient identified, Emergency Drugs available, Suction available and Patient being monitored Patient Re-evaluated:Patient Re-evaluated prior to inductionOxygen Delivery Method: Circle System Utilized Preoxygenation: Pre-oxygenation with 100% oxygen Intubation Type: IV induction Ventilation: Mask ventilation without difficulty LMA: LMA inserted LMA Size: 4.0 Number of attempts: 1 Airway Equipment and Method: bite block Placement Confirmation: positive ETCO2 Tube secured with: Tape Dental Injury: Teeth and Oropharynx as per pre-operative assessment

## 2011-12-26 NOTE — Op Note (Signed)
NAMELAMAN, RUDDEN NO.:  [id number]  MEDICAL RECORD NO.:  [id number]  LOCATION:                               FACILITY:  [facility]  PHYSICIAN:  [name], MD    DATE OF BIRTH:  [birth year]  DATE OF PROCEDURE:  12/25/2011 DATE OF DISCHARGE:                              OPERATIVE REPORT   SURGEON:  [name], MD  ASSISTANT:  None.  PREOPERATIVE DIAGNOSIS:  Bladder cancer.  POSTOPERATIVE DIAGNOSIS:  Bladder cancer.  PROCEDURES PERFORMED:  Cystoscopy and bladder biopsy.  SPECIMEN:  Bladder biopsy specimens x4 from the left lateral bladder wall posterior to the ureter, the bladder floor, the posterior bladder wall, and the bladder dome.  COMPLICATIONS:  None.  ESTIMATED BLOOD LOSS:  Minimal.  FINDINGS:  No new papillary tumor.  There was slight erythema surrounding previous areas of biopsy and new areas of slight erythema on the bladder floor and posterior bladder wall that were biopsied.  DRAINS:  Foley catheter.  HISTORY OF PRESENT ILLNESS:  This is an 78 year old gentleman who has a history of gross hematuria.  He was evaluated in clinic and found to have bladder tumor.  He underwent a transurethral resection of bladder tumor in December, which revealed high-grade non-muscle invasive tumor. After counseling regarding options, I recommended a repeat biopsy to ensure that staging was accurate.  The patient presents for that procedure today.  PROCEDURE:  Informed consent was obtained.  The patient was taken to the operating room where he was placed in supine position.  IV antibiotics were infused and general anesthesia was induced.  He was then placed in dorsal lithotomy position to make sure to pad all pertient neurovascular pressure points appropriately.  Genitals were then prepped and draped in the usual sterile fashion.  Following this, a time-out was performed in which the correct patient, surgical site, and procedure were  identified and agreed upon by the team.  Following this, a visual obturator to the gyrus resectoscope was placed through the urethra and into the bladder. The bladder was evaluated in a systematic fashion and make sure to visualize the entire surface of the bladder.  There were no new papillary tumors.  There were new areas of erythema on the posterior bladder wall and bladder floor as well as erythema around previous resection sites.  Cold-cup biopsy forceps were used to take biopsies of previous biopsy site over the left lateral bladder wall posterior to the left ureter, the bladder floor, and the posterior bladder wall.  All of these were then fulgurated with areas around them fulgurated with the gyrus device.  Next, the scope was removed and the extra long gyrus resectoscope was placed by placing first a visual obturator and then the resection loop.  Specimens were taken from the bladder dome, where a previous resection had been applied.  There was erythema around this area that was fulgurated with the loop.  There was noted to be no perforations during this procedure.  Because his bladder was very thin, I did feel it would be beneficial for him to have Foley catheter drainage due to his enlarged prostate.  There was good hemostasis maintained and all bladder  biopsy sites were sent for pathology.  Foley catheter was then placed and a B and O suppository was placed into his rectum.  He has placed back in supine position.  Anesthesia was reversed.  He has been taken to the PACU in stable condition.  He will be discharged home with Foley catheter, and return in 1 week for its removal.          ______________________________ [name], MD     DW/MEDQ  D:  12/25/2011  T:  12/26/2011  Job:  [id number]

## 2011-12-27 ENCOUNTER — Ambulatory Visit (INDEPENDENT_AMBULATORY_CARE_PROVIDER_SITE_OTHER): Payer: Medicare Other | Admitting: Ophthalmology

## 2012-01-11 ENCOUNTER — Emergency Department (HOSPITAL_COMMUNITY)
Admission: EM | Admit: 2012-01-11 | Discharge: 2012-01-11 | Disposition: A | Discharge status: Home / Self Care | Payer: Medicare Other | Attending: Emergency Medicine | Admitting: Emergency Medicine

## 2012-01-11 ENCOUNTER — Encounter (HOSPITAL_COMMUNITY): Payer: Self-pay | Admitting: *Deleted

## 2012-01-11 DIAGNOSIS — L509 Urticaria, unspecified: Secondary | ICD-10-CM | POA: Insufficient documentation

## 2012-01-11 DIAGNOSIS — N183 Chronic kidney disease, stage 3 unspecified: Secondary | ICD-10-CM | POA: Insufficient documentation

## 2012-01-11 DIAGNOSIS — T7840XA Allergy, unspecified, initial encounter: Secondary | ICD-10-CM | POA: Insufficient documentation

## 2012-01-11 DIAGNOSIS — L2989 Other pruritus: Secondary | ICD-10-CM | POA: Insufficient documentation

## 2012-01-11 DIAGNOSIS — R300 Dysuria: Secondary | ICD-10-CM | POA: Insufficient documentation

## 2012-01-11 DIAGNOSIS — Z79899 Other long term (current) drug therapy: Secondary | ICD-10-CM | POA: Insufficient documentation

## 2012-01-11 DIAGNOSIS — M129 Arthropathy, unspecified: Secondary | ICD-10-CM | POA: Insufficient documentation

## 2012-01-11 DIAGNOSIS — I10 Essential (primary) hypertension: Secondary | ICD-10-CM | POA: Insufficient documentation

## 2012-01-11 DIAGNOSIS — L298 Other pruritus: Secondary | ICD-10-CM | POA: Insufficient documentation

## 2012-01-11 DIAGNOSIS — Z8551 Personal history of malignant neoplasm of bladder: Secondary | ICD-10-CM | POA: Insufficient documentation

## 2012-01-11 DIAGNOSIS — E119 Type 2 diabetes mellitus without complications: Secondary | ICD-10-CM | POA: Insufficient documentation

## 2012-01-11 DIAGNOSIS — R21 Rash and other nonspecific skin eruption: Secondary | ICD-10-CM | POA: Insufficient documentation

## 2012-01-11 DIAGNOSIS — Z794 Long term (current) use of insulin: Secondary | ICD-10-CM | POA: Insufficient documentation

## 2012-01-11 DIAGNOSIS — I129 Hypertensive chronic kidney disease with stage 1 through stage 4 chronic kidney disease, or unspecified chronic kidney disease: Secondary | ICD-10-CM | POA: Insufficient documentation

## 2012-01-11 DIAGNOSIS — I2510 Atherosclerotic heart disease of native coronary artery without angina pectoris: Secondary | ICD-10-CM | POA: Insufficient documentation

## 2012-01-11 MED ORDER — DIPHENHYDRAMINE HCL 25 MG PO CAPS: 25.0000 mg | ORAL_CAPSULE | Freq: Four times a day (QID) | ORAL | Status: DC | PRN

## 2012-01-11 MED ORDER — FAMOTIDINE 20 MG PO TABS: 20.0000 mg | ORAL_TABLET | Freq: Two times a day (BID) | ORAL | Status: DC | PRN

## 2012-01-11 MED ORDER — DOXYCYCLINE HYCLATE 100 MG PO CAPS: 100.0000 mg | ORAL_CAPSULE | Freq: Two times a day (BID) | ORAL | Status: AC

## 2012-01-11 NOTE — Discharge Instructions (Signed)

## 2012-01-11 NOTE — ED Provider Notes (Signed)
Medical screening examination/treatment/procedure(s) were performed by non-physician practitioner and as supervising physician I was immediately available for consultation/collaboration.   Blanchie Dessert, MD 01/11/12 1343

## 2012-01-11 NOTE — ED Provider Notes (Signed)
History     CSN: [id number]  Arrival date & time 01/11/12  1056   First MD Initiated Contact with Patient 01/11/12 1139      Chief Complaint  Patient presents with  . Allergic Reaction    (Consider location/radiation/quality/duration/timing/severity/associated sxs/prior treatment) The history is provided by the patient, the spouse and a relative.  Pt with a hx of bladder cancer 2.5wks post-op from bladder procedure, started on Bactrim by Dr Jasmine December yesterday for a UTI presents with c/c of allergic reaction. Reports he awoke this morning with hives to BLE and trunk that are pruritic. Called Dr Jasmine December this morning and was advised to present to ED for further evaluation to r/o severe reaction. Denies any fever, HA, tongue or throat swelling, difficulty breathing, or nausea. Took benadryl PTA with significant improvement in symptoms.   Per pt spouse, he has had a mild rash each morning since his procedure that typically resolves spontaneously each afternoon- this reaction is more severe.   Past Medical History  Diagnosis Date  . BENIGN PROSTATIC HYPERTROPHY 05/21/2007  . DIABETES MELLITUS, TYPE II 05/21/2007  . DIVERTICULOSIS, COLON 05/21/2007    pt denies  . HYPERTENSION 05/21/2007  . PSORIASIS, SCALP 10/25/2008  . Cellulitis of left foot     Hospitalized in 2006. denies currently  . Gross hematuria     pt denies  . Chronic kidney disease (CKD), stage III (moderate) 12/04/2007    FOLLOWED BY DR PATEL-- LAST VISIT 3 MON. AGO  . Psoriasis ELBOWS  . Fatigue AGE-RELATED  . Arthritis   . CAD (coronary artery disease) CARDIOLOGIST- DR WALL - VISIT IN JUNE 2012 FOR CATH    nonobstructive by cath 6/12:  mid LAD 30%, proximal obtuse marginal-2 30%, proximal RCA 20%, mid RCA 30-40%.  He had normal cardiac output and mildly elevated filling pressures but no significant pulmonary hypertension;   Echocardiogram in May 2012 demonstrated EF 50-55% and left atrial enlargement   . Impaired hearing  BILATERAL HEARING AIDS  . Bladder cancer     2013    Past Surgical History  Procedure Date  . Vasectomy 15 YRS AGO  W/ ANES.  Marland Kitchen Penile prosthesis implant 15 YRS AGO  . Cardiovascular stress test 2007  . Cardiac catheterization 04-24-11/  DR MUHAMMAD ARIDA    MILD NONOBSTRUCTIVE CAD, NORMA CARDIAC OUTPUT  . Appendectomy AGE 7  . Tonsillectomy and adenoidectomy AGE 33 YRS AGO  . Cataract extraction w/ intraocular lens  implant, bilateral   . Incision and drainage foot LEFT FOOT DUE TO INFECTION FROM  NAIL PUNCTURE INJURY  . Cystoscopy w/ retrogrades 10/30/2011    Procedure: CYSTOSCOPY WITH RETROGRADE PYELOGRAM;  Surgeon: Molli Hazard, MD;  Location[facility];  Service: Urology;  Laterality: Bilateral;  CYSTOSCOPY POSS TURBT BILATERAL RETROGRADE PYLEOGRAM POSS BILATERAL URETEROSCOPY WITH BIOPSY LASER ABLATION OF LESION POSS BILATERAL URETERAL STENT PLACEMENT   CARM FLEXIBLE URETERAL SCOPE DIGITAL URETERAL SCOPE PK GYRUS HO  . Transurethral resection of bladder tumor 10/30/2011    Procedure: TRANSURETHRAL RESECTION OF BLADDER TUMOR (TURBT);  Surgeon: Molli Hazard, MD;  Location: [facility];  Service: Urology;  Laterality: N/A;    Family History  Problem Relation Age of Onset  . Diabetes Mother   . Heart disease Brother   . Heart disease Brother   . Heart disease Brother   . Heart disease Brother   . Heart disease Brother   . Heart disease Brother     History  Substance Use Topics  . Smoking status: Former Smoker    Quit date: 11/11/1968  . Smokeless tobacco: Never Used  . Alcohol Use: 1.2 oz/week    2 Glasses of wine per week     Rare      Review of Systems  Constitutional: Negative for fever.  HENT: Negative for facial swelling and trouble swallowing.   Respiratory: Negative for chest tightness and shortness of breath.   Cardiovascular: Negative for palpitations.  Gastrointestinal: Negative for nausea,  vomiting and abdominal pain.  Genitourinary: Positive for dysuria. Negative for flank pain.  Skin: Positive for rash.  Neurological: Negative for dizziness and headaches.     Allergies  Ace inhibitors; Actos; Aspirin; and Bactrim  Home Medications   Current Outpatient Rx  Name Route Sig Dispense Refill  . ALLOPURINOL 100 MG PO TABS Oral Take 1 tablet (100 mg total) by mouth daily. 90 tablet 3  . GLIPIZIDE ER 10 MG PO TB24 Oral Take 1 tablet (10 mg total) by mouth daily. 90 tablet 3  . INSULIN GLARGINE 100 UNIT/ML [place] SOLN Subcutaneous Inject 7 Units into the skin daily. Dispense 1 box of 5 pens    . PEN NEEDLES 5/16" 31G X 8 MM MISC  Use daily with lantus solostar pen as directed 100 each 3  . LOSARTAN POTASSIUM 50 MG PO TABS Oral Take 1 tablet (50 mg total) by mouth daily. Take instead of benicar or lisinopril 90 tablet 3    BP 109/57  Pulse 72  Temp(Src) 97.9 F (36.6 C) (Oral)  Resp 22  SpO2 96%  Physical Exam  Nursing note and vitals reviewed. Constitutional: He is oriented to person, place, and time. He appears well-developed and well-nourished. No distress.  HENT:  Head: Normocephalic and atraumatic.  Mouth/Throat: Oropharynx is clear and moist.       No uvula or tongue swelling  Eyes: Pupils are equal, round, and reactive to light.  Neck: Normal range of motion. Neck supple.  Cardiovascular: Normal rate and regular rhythm.   Pulmonary/Chest: Effort normal and breath sounds normal. No stridor. No respiratory distress. He has no wheezes.  Musculoskeletal: He exhibits no edema and no tenderness.  Neurological: He is alert and oriented to person, place, and time.  Skin: Rash noted. Rash is urticarial.       Urticarial rash to lower abd/back, upper thighs, groin. No blisters seen.    ED Course  Procedures (including critical care time)  Labs Reviewed - No data to display No results found.  Dx 1: Allergic reaction   MDM  Mild allergic reaction. No respiratory  s/s. Improved with benadryl. Advised continued benadryl use as well as pepcid. Cannot tolerate prednisone secondary to DM. Dr Jasmine December was consulted by attending MD, advises he changed Rx to doxycycline which was called into outpatient pharmacy. As it is closed over the weekend, will give pt new rx for doxy. Pt is to follow-up with Dr Jasmine December as needed for urinary symptoms. Advised f/u with PCP to discuss recurrent rash. Pt and spouse voice understanding of plan.         Orlie Dakin, [place] 01/11/12 1251

## 2012-01-11 NOTE — ED Notes (Signed)
Pt was taking bactrim starting last night. Pt has had two doses, one last night and one this AM.  Pt called MD [place] and was told to be checked in ED.  Pt was taking bactrim for bladder infection.  Pt had large wheals up both legs and took 25mg  of benadryl at home.  Pt denies shortness of breath or feeling like his mouth/throat is swollen.

## 2012-01-13 ENCOUNTER — Encounter (HOSPITAL_BASED_OUTPATIENT_CLINIC_OR_DEPARTMENT_OTHER): Payer: Self-pay | Admitting: Urology

## 2012-02-07 ENCOUNTER — Telehealth: Payer: Self-pay | Admitting: Family Medicine

## 2012-02-07 NOTE — Telephone Encounter (Signed)
Note came from outside MD. Call pt, see if he still has rash, see if he's still on torsemide.  Thanks.

## 2012-02-10 NOTE — Telephone Encounter (Signed)
LMOVM to return call.

## 2012-02-11 NOTE — Telephone Encounter (Signed)
LMOVM of home and cell phones to return call. 

## 2012-02-12 NOTE — Telephone Encounter (Signed)
If the rash continues after the abx, then have him f/u with me.  Thanks.

## 2012-02-12 NOTE — Telephone Encounter (Signed)
Spoke to patient's wife and was advised that patient does have a rash and gets one almost every time that he takes an antibiotic. Patient is on Doxycycline for a UTI. Patient's wife states that patient has not taken Torsemide for at least 3 weeks.

## 2012-02-12 NOTE — Telephone Encounter (Signed)
Patient's wife notified as instructed by telephone.

## 2012-02-12 NOTE — Telephone Encounter (Signed)
Left message at home number and cell to call back. 

## 2012-02-28 ENCOUNTER — Other Ambulatory Visit (INDEPENDENT_AMBULATORY_CARE_PROVIDER_SITE_OTHER): Payer: Medicare Other

## 2012-02-28 DIAGNOSIS — E119 Type 2 diabetes mellitus without complications: Secondary | ICD-10-CM

## 2012-02-28 LAB — HEMOGLOBIN A1C: Hgb A1c MFr Bld: 9.6 % — ABNORMAL HIGH (ref 4.6–6.5)

## 2012-03-03 ENCOUNTER — Encounter: Payer: Self-pay | Admitting: Family Medicine

## 2012-03-03 ENCOUNTER — Ambulatory Visit (INDEPENDENT_AMBULATORY_CARE_PROVIDER_SITE_OTHER): Payer: Medicare Other | Admitting: Family Medicine

## 2012-03-03 VITALS — BP 116/56 | HR 72 | Temp 97.8°F | Wt 217.8 lb

## 2012-03-03 DIAGNOSIS — E119 Type 2 diabetes mellitus without complications: Secondary | ICD-10-CM

## 2012-03-03 DIAGNOSIS — H579 Unspecified disorder of eye and adnexa: Secondary | ICD-10-CM

## 2012-03-03 DIAGNOSIS — E11319 Type 2 diabetes mellitus with unspecified diabetic retinopathy without macular edema: Secondary | ICD-10-CM

## 2012-03-03 DIAGNOSIS — E1139 Type 2 diabetes mellitus with other diabetic ophthalmic complication: Secondary | ICD-10-CM

## 2012-03-03 MED ORDER — GABAPENTIN 300 MG PO CAPS: 300.0000 mg | ORAL_CAPSULE | Freq: Two times a day (BID) | ORAL | Status: DC | PRN

## 2012-03-03 NOTE — Progress Notes (Signed)
He has neuropathy symptoms (mainly tingling, some pain) in his feet and it's likely from the DM2.  A1c 9. On lantus insulin.  He didn't check his sugar this AM.  His appetite is down and his wife says he's noncompliant with this diet.  He's been skipping meals.  We discussed.    He had a rash, but is resolved.  It didn't correspond to the demadex.  It would happen with septra, but not with demadex.  It happened when he wasn't on demadex.  Is resolved now.    He has f/u scheduled for bladder cancer treatment.   Meds, vitals, and allergies reviewed.   ROS: See HPI.  Otherwise, noncontributory.  nad ncat Mmm rrr ctab abd soft, not ttp  Diabetic foot exam: Normal inspection No skin breakdown No calluses  1+ DP pulses Dec sensation to light touch and monofilament on distal toes B Nails thickened

## 2012-03-03 NOTE — Assessment & Plan Note (Signed)
Likely with peripheral neuropathy.  Needs to check sugar daily, dose lantus based on that, and not skip meals.  Will try gabapentin for the tingling and pain with sedation caution.  Will try only at night initially.  Recheck A1c in 3 months along with CMET given renal disease. A1c d/w pt along with plan and DM2 risks/complications.  >25 min spent with face to face with patient, >50% counseling and/or coordinating care  Of note, I don't think the torsemide is contraindicated at this point.

## 2012-03-03 NOTE — Patient Instructions (Signed)
Recheck labs in 3 months before a visit.  Check your sugar daily and adjust as needed.  1 shot a day of insulin.  If AM sugar is <79, decrease by 1 unit that day.  If AM sugar is >121, increase by 1 unit that day.  If 80-120, no change.  Take gabapentin up to twice a day for the pain/tingling in your feet.

## 2012-04-28 ENCOUNTER — Encounter: Payer: Self-pay | Admitting: Family Medicine

## 2012-06-02 ENCOUNTER — Other Ambulatory Visit (INDEPENDENT_AMBULATORY_CARE_PROVIDER_SITE_OTHER): Payer: Medicare Other

## 2012-06-02 ENCOUNTER — Other Ambulatory Visit: Payer: Self-pay | Admitting: Family Medicine

## 2012-06-02 DIAGNOSIS — E119 Type 2 diabetes mellitus without complications: Secondary | ICD-10-CM

## 2012-06-02 DIAGNOSIS — E875 Hyperkalemia: Secondary | ICD-10-CM

## 2012-06-02 LAB — COMPREHENSIVE METABOLIC PANEL
ALT: 9 U/L (ref 0–53)
AST: 17 U/L (ref 0–37)
Albumin: 4 g/dL (ref 3.5–5.2)
Alkaline Phosphatase: 56 U/L (ref 39–117)
BUN: 37 mg/dL — ABNORMAL HIGH (ref 6–23)
CO2: 32 mEq/L (ref 19–32)
Calcium: 8.8 mg/dL (ref 8.4–10.5)
Chloride: 100 mEq/L (ref 96–112)
Creatinine, Ser: 2.2 mg/dL — ABNORMAL HIGH (ref 0.4–1.5)
GFR: 31.13 mL/min — ABNORMAL LOW (ref >60.00)
Glucose, Bld: 268 mg/dL — ABNORMAL HIGH (ref 70–99)
Potassium: 5.9 mEq/L — ABNORMAL HIGH (ref 3.5–5.1)
Sodium: 139 mEq/L (ref 135–145)
Total Bilirubin: 0.6 mg/dL (ref 0.3–1.2)
Total Protein: 7.4 g/dL (ref 6.0–8.3)

## 2012-06-02 LAB — HEMOGLOBIN A1C: Hgb A1c MFr Bld: 8.6 % — ABNORMAL HIGH (ref 4.6–6.5)

## 2012-06-02 MED ORDER — SODIUM POLYSTYRENE SULFONATE 15 GM/60ML PO SUSP: 15.0000 g | Freq: Once | ORAL | Status: DC

## 2012-06-05 ENCOUNTER — Other Ambulatory Visit (INDEPENDENT_AMBULATORY_CARE_PROVIDER_SITE_OTHER): Payer: Medicare Other

## 2012-06-05 DIAGNOSIS — E875 Hyperkalemia: Secondary | ICD-10-CM

## 2012-06-05 LAB — POTASSIUM: Potassium: 4 mEq/L (ref 3.5–5.1)

## 2012-06-09 ENCOUNTER — Ambulatory Visit (INDEPENDENT_AMBULATORY_CARE_PROVIDER_SITE_OTHER): Payer: Medicare Other | Admitting: Family Medicine

## 2012-06-09 ENCOUNTER — Encounter: Payer: Self-pay | Admitting: Family Medicine

## 2012-06-09 VITALS — BP 118/72 | HR 65 | Temp 97.8°F | Wt 221.0 lb

## 2012-06-09 DIAGNOSIS — H579 Unspecified disorder of eye and adnexa: Secondary | ICD-10-CM

## 2012-06-09 DIAGNOSIS — E119 Type 2 diabetes mellitus without complications: Secondary | ICD-10-CM

## 2012-06-09 DIAGNOSIS — E875 Hyperkalemia: Secondary | ICD-10-CM

## 2012-06-09 DIAGNOSIS — E1139 Type 2 diabetes mellitus with other diabetic ophthalmic complication: Secondary | ICD-10-CM

## 2012-06-09 DIAGNOSIS — E11319 Type 2 diabetes mellitus with unspecified diabetic retinopathy without macular edema: Secondary | ICD-10-CM

## 2012-06-09 NOTE — Assessment & Plan Note (Signed)
D/w pt about not skipping doses of insulin.  Will continue to work on diet and recheck sugar 6 times a day for 2 days before the next OV to use along with A1c to help determine need for meantime insulin.  He agrees.  Labs d/w pt.

## 2012-06-09 NOTE — Patient Instructions (Addendum)
If your AM sugar is 90 or lower, then decrease your dose by 1 unit.  If your AM sugar is 120 or higher, then increase your dose by 1 unit.  If you AM sugar is 91-119, no change in dose from prev day.  Check your sugar before and after each meal for 2 days before your next visit.  Recheck A1c in 3 months, visit with Damita Dunnings a few days later.   Take care.

## 2012-06-09 NOTE — Assessment & Plan Note (Signed)
Hyperkalemia resolved with treatment.  D/w pt about low K diet.  Handout given.

## 2012-06-09 NOTE — Progress Notes (Signed)
Hyperkalemia resolved with treatment.  D/w pt about low K diet.  Handout given.   Diabetes:  Using medications without difficulties: yes usually, but had skipped some insulin doses.  Hypoglycemic episodes: rare Hyperglycemic episodes:no Feet problems: improved on glipizide Blood Sugars averaging: ~100  Meds, vitals, and allergies reviewed.   ROS: See HPI.  Otherwise negative.    GEN: nad, alert and oriented HEENT: mucous membranes moist NECK: supple w/o LA CV: rrr. PULM: ctab, no inc wob ABD: soft, +bs EXT: no edema SKIN: no acute rash

## 2012-07-09 ENCOUNTER — Encounter: Payer: Self-pay | Admitting: Family Medicine

## 2012-09-08 ENCOUNTER — Other Ambulatory Visit (INDEPENDENT_AMBULATORY_CARE_PROVIDER_SITE_OTHER): Payer: Medicare Other

## 2012-09-08 ENCOUNTER — Other Ambulatory Visit: Payer: Medicare Other

## 2012-09-08 DIAGNOSIS — E119 Type 2 diabetes mellitus without complications: Secondary | ICD-10-CM

## 2012-09-08 LAB — HEMOGLOBIN A1C: Hgb A1c MFr Bld: 8.8 % — ABNORMAL HIGH (ref 4.6–6.5)

## 2012-09-15 ENCOUNTER — Encounter: Payer: Self-pay | Admitting: Family Medicine

## 2012-09-15 ENCOUNTER — Ambulatory Visit (INDEPENDENT_AMBULATORY_CARE_PROVIDER_SITE_OTHER): Payer: Medicare Other | Admitting: Family Medicine

## 2012-09-15 VITALS — BP 124/64 | HR 64 | Temp 97.7°F | Resp 24 | Ht 74.0 in | Wt 221.8 lb

## 2012-09-15 DIAGNOSIS — E1139 Type 2 diabetes mellitus with other diabetic ophthalmic complication: Secondary | ICD-10-CM

## 2012-09-15 DIAGNOSIS — L408 Other psoriasis: Secondary | ICD-10-CM

## 2012-09-15 DIAGNOSIS — E11319 Type 2 diabetes mellitus with unspecified diabetic retinopathy without macular edema: Secondary | ICD-10-CM

## 2012-09-15 DIAGNOSIS — Z23 Encounter for immunization: Secondary | ICD-10-CM

## 2012-09-15 NOTE — Patient Instructions (Addendum)
Let me know what medicine your are using for the psoriasis.   Take the insulin daily and don't skip meals.  Fill out the 3 day grid for your sugar.  Mail it back.  I'll help you with the insulin at that point.  Recheck A1c in 3 months before a visit.  Get soft arch support inserts/insoles.

## 2012-09-15 NOTE — Progress Notes (Signed)
Psoriasis.  Unrecalled med, episodic use.  On back, elbows and scalp.   Diabetes:  Using medications without difficulties: he's been skipping some doses of insulin.   Hypoglycemic episodes:no Hyperglycemic episodes: occ, 180-190 Feet problems: foot pain, on the distal midfoot, near the distal MT heads.  In B feet equally.   Blood Sugars averaging: usually 130 in AM He's isn't sticking to DM2 diet per wife. He'll skip meals.    Meds, vitals, and allergies reviewed.   ROS: See HPI.  Otherwise negative.    GEN: nad, alert and oriented HEENT: mucous membranes moist NECK: supple w/o LA CV: rrr. PULM: ctab, no inc wob ABD: soft, +bs EXT: no edema SKIN: no acute rash but psoriasis plaques noted.   Diabetic foot exam: Normal inspection except for dropped MT heads B on standing No skin breakdown No calluses  Normal DP pulses Normal sensation to light touch and monofilament Nails normal

## 2012-09-16 NOTE — Assessment & Plan Note (Addendum)
Needs to stick with diet, insulin, and then check glucose 6 times a day for a few days so I can see if he needs mealtime insulin.  >25 min spent with face to face with patient, >50% counseling and/or coordinating care.  He isn't controlled and is at risk of further complications.  He understands.  He can get arch support inserts for his feet.

## 2012-09-16 NOTE — Assessment & Plan Note (Signed)
He'll call about the medicine he was using, so we can adjust this.

## 2012-09-25 ENCOUNTER — Other Ambulatory Visit: Payer: Self-pay | Admitting: Family Medicine

## 2012-09-25 MED ORDER — CALCIPOTRIENE 0.005 % EX CREA: TOPICAL_CREAM | Freq: Two times a day (BID) | CUTANEOUS | Status: DC

## 2012-09-25 MED ORDER — INSULIN GLARGINE 100 UNIT/ML ~~LOC~~ SOLN: 12.0000 [IU] | Freq: Every day | SUBCUTANEOUS | Status: DC

## 2012-09-25 MED ORDER — "PEN NEEDLES 5/16"" 31G X 8 MM MISC": Status: DC

## 2012-09-25 NOTE — Telephone Encounter (Signed)
Patient advised concerning blood sugar readings and instructions.  He says the he has used the Dovonex consistently, sometimes not twice a day but at least once a day and it is not helping.  He is asking if there is a pill for it?

## 2012-09-25 NOTE — Telephone Encounter (Signed)
LMOVM to return call.

## 2012-09-25 NOTE — Telephone Encounter (Signed)
Call pt.  Sugars reviewed.  His AM sugars are okay and his post meal sugars aren't greatly elevated.  He likely doesn't need mealtime insulin. He needs to stick with DM2 diet and use the insulin he has.  Recheck A1c in 3 months.  Also, see if patient is using dovonex consistently BID for psoriasis.  If not, then do so.  If he is, and it still doesn't work, then let me know. Thanks.

## 2012-09-26 MED ORDER — FLUOCINONIDE-E 0.05 % EX CREA: TOPICAL_CREAM | Freq: Two times a day (BID) | CUTANEOUS | Status: DC

## 2012-09-26 NOTE — Telephone Encounter (Signed)
Stop the dovonex.  Use lidex instead.  Don't use on face.  This may increase is sugar.  If it does, then notify the clinic.  If not improved, then notify us.  Thanks.  Sent to [facility] to try in the short term.

## 2012-09-28 NOTE — Telephone Encounter (Signed)
Patient advised.

## 2012-10-05 ENCOUNTER — Encounter (INDEPENDENT_AMBULATORY_CARE_PROVIDER_SITE_OTHER): Payer: Medicare Other | Admitting: Ophthalmology

## 2012-10-05 DIAGNOSIS — I10 Essential (primary) hypertension: Secondary | ICD-10-CM

## 2012-10-05 DIAGNOSIS — H43819 Vitreous degeneration, unspecified eye: Secondary | ICD-10-CM

## 2012-10-05 DIAGNOSIS — E1139 Type 2 diabetes mellitus with other diabetic ophthalmic complication: Secondary | ICD-10-CM

## 2012-10-05 DIAGNOSIS — E11319 Type 2 diabetes mellitus with unspecified diabetic retinopathy without macular edema: Secondary | ICD-10-CM

## 2012-10-05 DIAGNOSIS — H35039 Hypertensive retinopathy, unspecified eye: Secondary | ICD-10-CM

## 2012-10-05 DIAGNOSIS — H35379 Puckering of macula, unspecified eye: Secondary | ICD-10-CM

## 2012-10-26 ENCOUNTER — Other Ambulatory Visit: Payer: Self-pay | Admitting: *Deleted

## 2012-10-26 MED ORDER — ALLOPURINOL 100 MG PO TABS: 100.0000 mg | ORAL_TABLET | Freq: Every day | ORAL | Status: DC

## 2012-10-26 MED ORDER — LOSARTAN POTASSIUM 50 MG PO TABS: 50.0000 mg | ORAL_TABLET | Freq: Every day | ORAL | Status: DC

## 2012-10-29 ENCOUNTER — Other Ambulatory Visit: Payer: Self-pay | Admitting: Urology

## 2012-11-24 ENCOUNTER — Encounter (INDEPENDENT_AMBULATORY_CARE_PROVIDER_SITE_OTHER): Payer: Medicare Other | Admitting: Ophthalmology

## 2012-11-24 ENCOUNTER — Ambulatory Visit: Admit: 2012-11-24 | Payer: Self-pay | Admitting: Ophthalmology

## 2012-11-24 SURGERY — 25 GAUGE PARS PLANA VITRECTOMY WITH 20 GAUGE MVR PORT FOR MACULAR HOLE
Anesthesia: General | Laterality: Right

## 2012-11-25 ENCOUNTER — Encounter (HOSPITAL_BASED_OUTPATIENT_CLINIC_OR_DEPARTMENT_OTHER): Payer: Self-pay | Admitting: *Deleted

## 2012-11-25 NOTE — Progress Notes (Signed)
Pt instructed npo p mn 1/19 x allopurinol, neurontin w sip of water.  To [facility] 1/20 @ 1115.  Needs istat, ekg

## 2012-11-30 ENCOUNTER — Ambulatory Visit (HOSPITAL_BASED_OUTPATIENT_CLINIC_OR_DEPARTMENT_OTHER)
Admission: RE | Admit: 2012-11-30 | Discharge: 2012-12-01 | Disposition: A | Discharge status: Home Health | Payer: Medicare Other | Source: Ambulatory Visit | Attending: Urology | Admitting: Urology

## 2012-11-30 ENCOUNTER — Encounter (HOSPITAL_BASED_OUTPATIENT_CLINIC_OR_DEPARTMENT_OTHER): Payer: Self-pay | Admitting: Anesthesiology

## 2012-11-30 ENCOUNTER — Encounter (HOSPITAL_BASED_OUTPATIENT_CLINIC_OR_DEPARTMENT_OTHER): Payer: Self-pay | Admitting: *Deleted

## 2012-11-30 ENCOUNTER — Encounter (HOSPITAL_COMMUNITY): Payer: Self-pay | Admitting: *Deleted

## 2012-11-30 ENCOUNTER — Ambulatory Visit (HOSPITAL_COMMUNITY): Payer: Medicare Other

## 2012-11-30 ENCOUNTER — Ambulatory Visit (HOSPITAL_BASED_OUTPATIENT_CLINIC_OR_DEPARTMENT_OTHER): Payer: Medicare Other | Admitting: Anesthesiology

## 2012-11-30 ENCOUNTER — Encounter (HOSPITAL_COMMUNITY)
Admission: RE | Disposition: A | Discharge status: Home Health | Payer: Self-pay | Source: Ambulatory Visit | Attending: Urology

## 2012-11-30 DIAGNOSIS — E119 Type 2 diabetes mellitus without complications: Secondary | ICD-10-CM | POA: Insufficient documentation

## 2012-11-30 DIAGNOSIS — I10 Essential (primary) hypertension: Secondary | ICD-10-CM | POA: Insufficient documentation

## 2012-11-30 DIAGNOSIS — R0902 Hypoxemia: Secondary | ICD-10-CM

## 2012-11-30 DIAGNOSIS — I2510 Atherosclerotic heart disease of native coronary artery without angina pectoris: Secondary | ICD-10-CM | POA: Insufficient documentation

## 2012-11-30 DIAGNOSIS — C679 Malignant neoplasm of bladder, unspecified: Secondary | ICD-10-CM | POA: Insufficient documentation

## 2012-11-30 HISTORY — PX: CYSTOSCOPY W/ RETROGRADES: SHX1426

## 2012-11-30 HISTORY — PX: CYSTOSCOPY WITH BIOPSY: SHX5122

## 2012-11-30 LAB — BASIC METABOLIC PANEL
BUN: 39 mg/dL — ABNORMAL HIGH (ref 6–23)
CO2: 30 mEq/L (ref 19–32)
Calcium: 8.5 mg/dL (ref 8.4–10.5)
Chloride: 101 mEq/L (ref 96–112)
Creatinine, Ser: 1.76 mg/dL — ABNORMAL HIGH (ref 0.50–1.35)
GFR calc Af Amer: 40 mL/min — ABNORMAL LOW (ref >90)
GFR calc non Af Amer: 34 mL/min — ABNORMAL LOW (ref >90)
Glucose, Bld: 87 mg/dL (ref 70–99)
Potassium: 4.6 mEq/L (ref 3.5–5.1)
Sodium: 141 mEq/L (ref 135–145)

## 2012-11-30 LAB — CBC
HCT: 45.2 % (ref 39.0–52.0)
Hemoglobin: 14.6 g/dL (ref 13.0–17.0)
MCH: 32.7 pg (ref 26.0–34.0)
MCHC: 32.3 g/dL (ref 30.0–36.0)
MCV: 101.1 fL — ABNORMAL HIGH (ref 78.0–100.0)
Platelets: 178 10*3/uL (ref 150–400)
RBC: 4.47 MIL/uL (ref 4.22–5.81)
RDW: 12.9 % (ref 11.5–15.5)
WBC: 9.6 10*3/uL (ref 4.0–10.5)

## 2012-11-30 LAB — POCT I-STAT, CHEM 8
BUN: 46 mg/dL — ABNORMAL HIGH (ref 6–23)
Calcium, Ion: 1.17 mmol/L (ref 1.13–1.30)
Chloride: 102 mEq/L (ref 96–112)
Creatinine, Ser: 1.9 mg/dL — ABNORMAL HIGH (ref 0.50–1.35)
Glucose, Bld: 92 mg/dL (ref 70–99)
HCT: 49 % (ref 39.0–52.0)
Hemoglobin: 16.7 g/dL (ref 13.0–17.0)
Potassium: 4.5 mEq/L (ref 3.5–5.1)
Sodium: 144 mEq/L (ref 135–145)
TCO2: 36 mmol/L (ref 0–100)

## 2012-11-30 LAB — GLUCOSE, CAPILLARY
Glucose-Capillary: 87 mg/dL (ref 70–99)
Glucose-Capillary: 110 mg/dL — ABNORMAL HIGH (ref 70–99)

## 2012-11-30 LAB — PRO B NATRIURETIC PEPTIDE: Pro B Natriuretic peptide (BNP): 107.2 pg/mL (ref 0–450)

## 2012-11-30 SURGERY — CYSTOSCOPY, WITH RETROGRADE PYELOGRAM
Anesthesia: General | Site: Bladder | Wound class: Clean Contaminated

## 2012-11-30 MED ORDER — SENNOSIDES-DOCUSATE SODIUM 8.6-50 MG PO TABS
1.0000 | ORAL_TABLET | Freq: Two times a day (BID) | ORAL | Status: DC
  Administered 2012-11-30: 1 via ORAL
  Filled 2012-11-30 (×3): qty 1

## 2012-11-30 MED ORDER — ACETAMINOPHEN 10 MG/ML IV SOLN
INTRAVENOUS | Status: DC | PRN
  Administered 2012-11-30: 1000 mg via INTRAVENOUS

## 2012-11-30 MED ORDER — STERILE WATER FOR IRRIGATION IR SOLN
Status: DC | PRN
  Administered 2012-11-30: 1

## 2012-11-30 MED ORDER — TORSEMIDE 10 MG PO TABS
10.0000 mg | ORAL_TABLET | Freq: Every day | ORAL | Status: DC
  Administered 2012-11-30: 10 mg via ORAL
  Filled 2012-11-30 (×2): qty 1

## 2012-11-30 MED ORDER — PROMETHAZINE HCL 25 MG/ML IJ SOLN
6.2500 mg | INTRAMUSCULAR | Status: DC | PRN
  Filled 2012-11-30: qty 1

## 2012-11-30 MED ORDER — FLUOCINONIDE-E 0.05 % EX CREA
TOPICAL_CREAM | Freq: Two times a day (BID) | CUTANEOUS | Status: DC
  Filled 2012-11-30: qty 15

## 2012-11-30 MED ORDER — FENTANYL CITRATE 0.05 MG/ML IJ SOLN
25.0000 ug | INTRAMUSCULAR | Status: DC | PRN
  Filled 2012-11-30: qty 1

## 2012-11-30 MED ORDER — LOSARTAN POTASSIUM 50 MG PO TABS
50.0000 mg | ORAL_TABLET | Freq: Every day | ORAL | Status: DC
  Administered 2012-11-30: 50 mg via ORAL
  Filled 2012-11-30 (×2): qty 1

## 2012-11-30 MED ORDER — FUROSEMIDE 10 MG/ML IJ SOLN
40.0000 mg | Freq: Once | INTRAMUSCULAR | Status: AC
  Administered 2012-11-30: 40 mg via INTRAVENOUS
  Filled 2012-11-30: qty 4

## 2012-11-30 MED ORDER — LIDOCAINE HCL 2 % EX GEL
CUTANEOUS | Status: DC | PRN
  Administered 2012-11-30: 1 via URETHRAL

## 2012-11-30 MED ORDER — ONDANSETRON HCL 4 MG/2ML IJ SOLN: 4.0000 mg | INTRAMUSCULAR | Status: DC | PRN

## 2012-11-30 MED ORDER — TORSEMIDE 50 MG/5ML IV SOLN
200.0000 mg | Freq: Every day | INTRAVENOUS | Status: DC
  Filled 2012-11-30: qty 20

## 2012-11-30 MED ORDER — FUROSEMIDE 10 MG/ML IJ SOLN: 40.0000 mg | Freq: Once | INTRAMUSCULAR | Status: DC

## 2012-11-30 MED ORDER — GABAPENTIN 300 MG PO CAPS
300.0000 mg | ORAL_CAPSULE | Freq: Two times a day (BID) | ORAL | Status: DC | PRN
  Filled 2012-11-30: qty 1

## 2012-11-30 MED ORDER — FENTANYL CITRATE 0.05 MG/ML IJ SOLN
INTRAMUSCULAR | Status: DC | PRN
  Administered 2012-11-30 (×6): 12.5 ug via INTRAVENOUS
  Administered 2012-11-30: 25 ug via INTRAVENOUS
  Administered 2012-11-30 (×2): 12.5 ug via INTRAVENOUS
  Administered 2012-11-30: 25 ug via INTRAVENOUS

## 2012-11-30 MED ORDER — LIDOCAINE HCL (CARDIAC) 20 MG/ML IV SOLN
INTRAVENOUS | Status: DC | PRN
  Administered 2012-11-30: 75 mg via INTRAVENOUS

## 2012-11-30 MED ORDER — LACTATED RINGERS IV SOLN
INTRAVENOUS | Status: DC
  Administered 2012-11-30: 12:00:00 via INTRAVENOUS
  Filled 2012-11-30: qty 1000

## 2012-11-30 MED ORDER — FLUOCINONIDE 0.05 % EX CREA
TOPICAL_CREAM | Freq: Two times a day (BID) | CUTANEOUS | Status: DC
  Administered 2012-11-30: 22:00:00 via TOPICAL
  Filled 2012-11-30: qty 30

## 2012-11-30 MED ORDER — HYDROCODONE-ACETAMINOPHEN 5-325 MG PO TABS: 1.0000 | ORAL_TABLET | ORAL | Status: DC | PRN

## 2012-11-30 MED ORDER — ALLOPURINOL 100 MG PO TABS
100.0000 mg | ORAL_TABLET | Freq: Every day | ORAL | Status: DC
  Administered 2012-11-30: 100 mg via ORAL
  Filled 2012-11-30 (×2): qty 1

## 2012-11-30 MED ORDER — INSULIN GLARGINE 100 UNIT/ML ~~LOC~~ SOLN: 12.0000 [IU] | Freq: Every day | SUBCUTANEOUS | Status: DC

## 2012-11-30 MED ORDER — PHENAZOPYRIDINE HCL 100 MG PO TABS: 100.0000 mg | ORAL_TABLET | Freq: Three times a day (TID) | ORAL | Status: DC | PRN

## 2012-11-30 MED ORDER — CEFAZOLIN SODIUM-DEXTROSE 2-3 GM-% IV SOLR
2.0000 g | Freq: Three times a day (TID) | INTRAVENOUS | Status: AC
Start: 2012-11-30 — End: 2012-12-01
  Administered 2012-11-30 – 2012-12-01 (×2): 2 g via INTRAVENOUS
  Filled 2012-11-30 (×2): qty 50

## 2012-11-30 MED ORDER — CEFAZOLIN SODIUM-DEXTROSE 2-3 GM-% IV SOLR
INTRAVENOUS | Status: DC | PRN
  Administered 2012-11-30: 2 g via INTRAVENOUS

## 2012-11-30 MED ORDER — LACTATED RINGERS IV SOLN
INTRAVENOUS | Status: DC | PRN
  Administered 2012-11-30 (×2): via INTRAVENOUS

## 2012-11-30 MED ORDER — EPHEDRINE SULFATE 50 MG/ML IJ SOLN
INTRAMUSCULAR | Status: DC | PRN
  Administered 2012-11-30 (×2): 10 mg via INTRAVENOUS

## 2012-11-30 MED ORDER — CEFAZOLIN SODIUM-DEXTROSE 2-3 GM-% IV SOLR
2.0000 g | INTRAVENOUS | Status: DC
  Filled 2012-11-30: qty 50

## 2012-11-30 MED ORDER — ONDANSETRON HCL 4 MG/2ML IJ SOLN
INTRAMUSCULAR | Status: DC | PRN
  Administered 2012-11-30: 4 mg via INTRAVENOUS

## 2012-11-30 MED ORDER — IOHEXOL 350 MG/ML SOLN
INTRAVENOUS | Status: DC | PRN
  Administered 2012-11-30: 17 mL via URETHRAL

## 2012-11-30 MED ORDER — HYDROCODONE-ACETAMINOPHEN 5-325 MG PO TABS
1.0000 | ORAL_TABLET | ORAL | Status: DC | PRN
  Administered 2012-12-01: 2 via ORAL
  Filled 2012-11-30: qty 2

## 2012-11-30 MED ORDER — PROPOFOL 10 MG/ML IV BOLUS
INTRAVENOUS | Status: DC | PRN
  Administered 2012-11-30: 170 mg via INTRAVENOUS

## 2012-11-30 MED ORDER — INSULIN ASPART 100 UNIT/ML ~~LOC~~ SOLN: 0.0000 [IU] | Freq: Three times a day (TID) | SUBCUTANEOUS | Status: DC

## 2012-11-30 MED ORDER — GLIPIZIDE ER 10 MG PO TB24
10.0000 mg | ORAL_TABLET | Freq: Every day | ORAL | Status: DC
  Administered 2012-11-30: 10 mg via ORAL
  Filled 2012-11-30 (×2): qty 1

## 2012-11-30 MED ORDER — SENNOSIDES-DOCUSATE SODIUM 8.6-50 MG PO TABS: 1.0000 | ORAL_TABLET | Freq: Two times a day (BID) | ORAL | Status: DC

## 2012-11-30 MED ORDER — HYOSCYAMINE SULFATE 0.125 MG PO TABS: 0.1250 mg | ORAL_TABLET | ORAL | Status: DC | PRN

## 2012-11-30 SURGICAL SUPPLY — 46 items
ADAPTER CATH URET PLST 4-6FR (CATHETERS) IMPLANT
ADPR CATH URET STRL DISP 4-6FR (CATHETERS)
BAG DRAIN URO-CYSTO SKYTR STRL (DRAIN) ×3 IMPLANT
BAG DRN UROCATH (DRAIN) ×2
BASKET LASER NITINOL 1.9FR (BASKET) IMPLANT
BASKET STNLS GEMINI 4WIRE 3FR (BASKET) IMPLANT
BASKET ZERO TIP NITINOL 2.4FR (BASKET) IMPLANT
BRUSH URET BIOPSY 3F (UROLOGICAL SUPPLIES) IMPLANT
BSKT STON RTRVL 120 1.9FR (BASKET)
BSKT STON RTRVL GEM 120X11 3FR (BASKET)
BSKT STON RTRVL ZERO TP 2.4FR (BASKET)
CANISTER SUCT LVC 12 LTR MEDI- (MISCELLANEOUS) ×1 IMPLANT
CATH FOLEY 2WAY SLVR  5CC 20FR (CATHETERS) ×1
CATH FOLEY 2WAY SLVR 5CC 20FR (CATHETERS) IMPLANT
CATH INTERMIT  6FR 70CM (CATHETERS) IMPLANT
CATH URET 5FR 28IN CONE TIP (BALLOONS)
CATH URET 5FR 28IN OPEN ENDED (CATHETERS) ×1 IMPLANT
CATH URET 5FR 70CM CONE TIP (BALLOONS) IMPLANT
CLOTH BEACON ORANGE TIMEOUT ST (SAFETY) ×3 IMPLANT
DRAPE CAMERA CLOSED 9X96 (DRAPES) ×3 IMPLANT
ELECT REM PT RETURN 9FT ADLT (ELECTROSURGICAL) ×3
ELECTRODE REM PT RTRN 9FT ADLT (ELECTROSURGICAL) ×2 IMPLANT
FORCEPS BIOP 2.4F 115CM BACKLD (INSTRUMENTS) ×1 IMPLANT
GLOVE BIO SURGEON STRL SZ7 (GLOVE) ×3 IMPLANT
GLOVE BIOGEL M 6.5 STRL (GLOVE) ×1 IMPLANT
GLOVE ECLIPSE 6.5 STRL STRAW (GLOVE) ×1 IMPLANT
GLOVE INDICATOR 7.5 STRL GRN (GLOVE) IMPLANT
GOWN PREVENTION PLUS LG XLONG (DISPOSABLE) ×3 IMPLANT
GOWN STRL REIN XL XLG (GOWN DISPOSABLE) ×3 IMPLANT
GUIDEWIRE 0.038 PTFE COATED (WIRE) IMPLANT
GUIDEWIRE ANG ZIPWIRE 038X150 (WIRE) IMPLANT
GUIDEWIRE STR DUAL SENSOR (WIRE) ×3 IMPLANT
IV NS IRRIG 3000ML ARTHROMATIC (IV SOLUTION) ×3 IMPLANT
KIT BALLIN UROMAX 15FX10 (LABEL) IMPLANT
KIT BALLN UROMAX 15FX4 (MISCELLANEOUS) IMPLANT
KIT BALLN UROMAX 26 75X4 (MISCELLANEOUS)
LASER FIBER DISP (UROLOGICAL SUPPLIES) IMPLANT
NEEDLE HYPO 22GX1.5 SAFETY (NEEDLE) IMPLANT
NS IRRIG 500ML POUR BTL (IV SOLUTION) IMPLANT
PACK CYSTOSCOPY (CUSTOM PROCEDURE TRAY) ×3 IMPLANT
SET HIGH PRES BAL DIL (LABEL)
SHEATH URET ACCESS 12FR/35CM (UROLOGICAL SUPPLIES) IMPLANT
SHEATH URET ACCESS 12FR/55CM (UROLOGICAL SUPPLIES) IMPLANT
SYRINGE 10CC LL (SYRINGE) ×1 IMPLANT
SYRINGE IRR TOOMEY STRL 70CC (SYRINGE) IMPLANT
WATER STERILE IRR 3000ML UROMA (IV SOLUTION) ×3 IMPLANT

## 2012-11-30 NOTE — Progress Notes (Signed)
I was called to evaluate the patient. He was having subjective shortness of breath which he states is at his baseline compared to preoperatively.the patient specifically denies shortness of breath to me prior to surgery. His wife concurs that he does have shortness of breath while he's around the house and dyspnea on exertion. He does not have a history of congestive heart failure, but he does have a history of coronary artery disease. He received a total of 1300 mL of IV fluid since coming into the hospital today. He is requiring 2 L of oxygen per nasal cannula to maintain oxygen saturations in the 90s. He specifically denies chest pain. He does not have tachypnea.  Filed Vitals:   11/30/12 1530  BP:   Pulse: 60  Temp:   Resp: 11   Gen: NAD, awake, alert CV: RRR, no murmurs Chest: CTA-B, no crackles Abd: NTTP, ND Ext: negative edema or cyanosis    A/P: Poor oxygen saturation post bladder biopsy. -Differential includes congestive heart failure with fluid overload or exacerbation of previously unknown underlying lung disease.   -Labs indicate that his kidney function is at baseline. No evidence of CHF exacerbation due to BNP of 107.  -I recommended admission with diuresis. He did not want an indwelling catheter. I gave warnings regarding lasix and sulfa reaction. We discussed risks/benefits/side effects (Stevens-Johnson syndrome). I spoke with the pharmacist regarding other diuretics, but the most effect ones have a sulfa moiety; I was informed that this usually doesn't interact with sulfa allergies.  -Admit to urology. Lasix 40mg  IV once now. Repeat labs and CXR in morning.

## 2012-11-30 NOTE — Progress Notes (Signed)
Dr Delma Post notified of pt dropping 02 sats 71% on RA, VSS, istructed pt on using IS, encouraged pt to cough & deep w sats ranging from 86% - 90%. BBS auscultated posterior diminished w fine crackles RLL. No new orders noted.

## 2012-11-30 NOTE — Progress Notes (Signed)
Dr. Winfred Leeds in and shown cxr results.  No new orders received. Dr. Jasmine December informed of bladder scan result of 60cc.  Dr. Jasmine December aware of cxr results. Awaiting lab results.

## 2012-11-30 NOTE — Anesthesia Procedure Notes (Signed)
Procedure Name: LMA Insertion Date/Time: 11/30/2012 12:39 PM Performed by: Justice Rocher Pre-anesthesia Checklist: Patient identified, Emergency Drugs available, Suction available and Patient being monitored Patient Re-evaluated:Patient Re-evaluated prior to inductionOxygen Delivery Method: Circle System Utilized Preoxygenation: Pre-oxygenation with 100% oxygen Intubation Type: IV induction Ventilation: Mask ventilation without difficulty LMA: LMA with gastric port inserted LMA Size: 5.0 Number of attempts: 1 Placement Confirmation: positive ETCO2 Tube secured with: Tape Dental Injury: Teeth and Oropharynx as per pre-operative assessment

## 2012-11-30 NOTE — Progress Notes (Signed)
o2sats remain 70-86% on rm air despite vigorous resp toilet.  O2 placed @ 1l/[place]. O2 sat increased to 84-89%. Family in room. Dr. Winfred Leeds paged via beeper.

## 2012-11-30 NOTE — Progress Notes (Signed)
Lab results in.  Dr. Jasmine December paged, already aware of results. Pt to be admitted OWER. Dr Jasmine December informed of bed assignment of 1411.  No new orders received at this time.

## 2012-11-30 NOTE — H&P (Signed)
Urology History and Physical Exam  CC: Bladder cancer  HPI: 79 year old male presents with history of bladder cancer. This was high grade urothelial carcinoma which was noninvasive. He completed a course of BCG induction in June 2013. He also completed a maintenance course in September 2013. Last upper tract evaluation was a noncontrasted CT in October 2012 which was negative as well as negative bilateral retrograde pyelograms in December 2012. He was discovered to have an area of erythema in his bladder on surveillance cystoscopy in December 2013. This was on the posterior dome of the bladder very close to the place where his original tumor was located. It was flat in nature. Because of his history of high-grade cancer I recommended cystoscopy, bladder biopsy, and bilateral retrograde pyelograms.  We have discussed the risks, benefits, side effects, and likelihood of achieving goals. UA from 11/23/12 was negative for signs of infection.  PMH: Past Medical History  Diagnosis Date  . BENIGN PROSTATIC HYPERTROPHY 05/21/2007  . DIABETES MELLITUS, TYPE II 05/21/2007  . DIVERTICULOSIS, COLON 05/21/2007    pt denies  . HYPERTENSION 05/21/2007  . PSORIASIS, SCALP 10/25/2008  . Cellulitis of left foot     Hospitalized in 2006  . Gross hematuria     pt denies  . Psoriasis ELBOWS  . Fatigue AGE-RELATED  . Arthritis   . CAD (coronary artery disease) CARDIOLOGIST- DR WALL - VISIT IN JUNE 2012 FOR CATH    nonobstructive by cath 6/12:  mid LAD 30%, proximal obtuse marginal-2 30%, proximal RCA 20%, mid RCA 30-40%.  He had normal cardiac output and mildly elevated filling pressures but no significant pulmonary hypertension;   Echocardiogram in May 2012 demonstrated EF 50-55% and left atrial enlargement   . Impaired hearing BILATERAL HEARING AIDS  . Bladder cancer     2013  . Chronic kidney disease (CKD), stage III (moderate) 12/04/2007    FOLLOWED BY DR PATEL    PSH: Past Surgical History  Procedure  Date  . Vasectomy 15 YRS AGO  W/ ANES.  Marland Kitchen Penile prosthesis implant 15 YRS AGO  . Cardiovascular stress test 2007  . Cardiac catheterization 04-24-11/  DR MUHAMMAD ARIDA    MILD NONOBSTRUCTIVE CAD, NORMA CARDIAC OUTPUT  . Appendectomy AGE 75  . Tonsillectomy and adenoidectomy AGE 89 YRS AGO  . Cataract extraction w/ intraocular lens  implant, bilateral   . Incision and drainage foot LEFT FOOT DUE TO INFECTION FROM  NAIL PUNCTURE INJURY  . Cystoscopy w/ retrogrades 10/30/2011    Procedure: CYSTOSCOPY WITH RETROGRADE PYELOGRAM;  Surgeon: Molli Hazard, MD;  Location: [facility];  Service: Urology;  Laterality: Bilateral;  CYSTOSCOPY POSS TURBT BILATERAL RETROGRADE PYLEOGRAM POSS BILATERAL URETEROSCOPY WITH BIOPSY LASER ABLATION OF LESION POSS BILATERAL URETERAL STENT PLACEMENT   CARM FLEXIBLE URETERAL SCOPE DIGITAL URETERAL SCOPE PK GYRUS HO  . Transurethral resection of bladder tumor 10/30/2011    Procedure: TRANSURETHRAL RESECTION OF BLADDER TUMOR (TURBT);  Surgeon: Molli Hazard, MD;  Location: [facility];  Service: Urology;  Laterality: N/A;  . Cystoscopy 12/25/2011    Procedure: CYSTOSCOPY;  Surgeon: Molli Hazard, MD;  Location: [facility];  Service: Urology;  Laterality: N/A;  needs intubation and to be paralyzed   . Transurethral resection of bladder tumor 12/25/2011    Procedure: TRANSURETHRAL RESECTION OF BLADDER TUMOR (TURBT);  Surgeon: Molli Hazard, MD;  Location: [facility];  Service: Urology;  Laterality: N/A;  need long gyrus instruments  Dr states he has spoken w/ Nelwyn Salisbury regarding this      Allergies: Allergies  Allergen Reactions  . Ace Inhibitors     cough  . Actos (Pioglitazone Hydrochloride)     Held 2012 bladder cancer  . Aspirin     Held 2012 due to hematuria and bruising.   . Bactrim     Rash, presumed allergy  . Sulfa Drugs Cross Reactors     rash     Medications: No prescriptions prior to admission     Social History: History   Social History  . Marital Status: Married    Spouse Name: N/A    Number of Children: N/A  . Years of Education: N/A   Occupational History  . Not on file.   Social History Main Topics  . Smoking status: Former Smoker    Quit date: 11/11/1968  . Smokeless tobacco: Never Used  . Alcohol Use: 1.2 oz/week    2 Glasses of wine per week     Comment: Rare  . Drug Use: No  . Sexually Active: Not on file   Other Topics Concern  . Not on file   Social History Narrative   Retired Event organiser.Active with golf.    Family History: Family History  Problem Relation Age of Onset  . Diabetes Mother   . Heart disease Brother   . Heart disease Brother   . Heart disease Brother   . Heart disease Brother   . Heart disease Brother   . Heart disease Brother     Review of Systems: Positive: None Negative: Chest pain, SOB, or fever..  A further 10 point review of systems was negative except what is listed in the HPI.  Physical Exam: Filed Vitals:   11/30/12 1200  BP: 147/71  Pulse: 64  Temp: 97.1 F (36.2 C)  Resp: 20    General: No acute distress.  Awake. Head:  Normocephalic.  Atraumatic. ENT:  EOMI.  Mucous membranes moist Neck:  Supple.  No lymphadenopathy. CV:  S1 present. S2 present. Regular rate. Pulmonary: Equal effort bilaterally.  Clear to auscultation bilaterally. Abdomen: Soft.  Non- tender to palpation. Skin:  Normal turgor.  No visible rash. Extremity: No gross deformity of bilateral upper extremities.  No gross deformity of    bilateral lower extremities. Neurologic: Alert. Appropriate mood.    Studies:  No results found for this basename: HGB:2,WBC:2,PLT:2 in the last 72 hours  No results found for this basename: NA:2,K:2,CL:2,CO2:2,BUN:2,CREATININE:2,CALCIUM:2,MAGNESIUM:2,GFRNONAA:2,GFRAA:2 in the last 72 hours   No results found for this basename:  PT:2,INR:2,APTT:2 in the last 72 hours   No components found with this basename: ABG:2    Assessment:  Bladder cancer  Plan: Procedure the operating room for cystoscopy, bladder biopsy, and bilateral retrograde pyelograms.

## 2012-11-30 NOTE — Progress Notes (Signed)
Dr. Jasmine December spoke w wife and daughter via phone.  Waiting admit orders

## 2012-11-30 NOTE — Anesthesia Preprocedure Evaluation (Addendum)
Anesthesia Evaluation  Patient identified by MRN, date of birth, ID band Patient awake    Reviewed: Allergy & Precautions, H&P , NPO status , Patient's Chart, lab work & pertinent test results  Airway       Dental No notable dental hx.    Pulmonary shortness of breath,  breath sounds clear to auscultation        Cardiovascular hypertension, Pt. on medications + CAD  Catheterization 6/12 reviewed.  ECG with artifact. On the monitor, he is in NSR.   Neuro/Psych negative neurological ROS  negative psych ROS   GI/Hepatic negative GI ROS, Neg liver ROS,   Endo/Other  diabetes, Type 2, Insulin Dependent and Oral Hypoglycemic Agents  Renal/GU Renal InsufficiencyRenal diseaseStage 3  negative genitourinary   Musculoskeletal negative musculoskeletal ROS (+)   Abdominal   Peds negative pediatric ROS (+)  Hematology negative hematology ROS (+)   Anesthesia Other Findings   Reproductive/Obstetrics negative OB ROS                       Anesthesia Physical Anesthesia Plan  ASA: III  Anesthesia Plan: General   Post-op Pain Management:    Induction: Intravenous  Airway Management Planned: LMA  Additional Equipment:   Intra-op Plan:   Post-operative Plan:   Informed Consent:   Plan Discussed with: CRNA and Surgeon  Anesthesia Plan Comments:        Anesthesia Quick Evaluation

## 2012-11-30 NOTE — Progress Notes (Signed)
Portable CXR done.Dr. Jasmine December in room talking w pt.

## 2012-11-30 NOTE — Op Note (Signed)
Urology Operative Report  Date of Procedure: 11/30/12  Surgeon: [name], MD Assistant: None  Preoperative Diagnosis: Bladder cancer Postoperative Diagnosis:  Same  Procedure(s): Cystoscopy Bladder biopsy Bilateral retrograde pyelograms  Estimated blood loss: Minimal  Specimen:  Bladder biopsy  Drains: None  Complications: None  Findings: Mild bulbar urethral stricture. Erythema in the bladder; negative for tumors. No filling defects on bilateral retrograde pyelograms  History of present illness: 79 year old male with a history of high-grade urothelial carcinoma of the bladder. He has been treated with intravesical BCG. He returned to clinic for followup cystoscopy and was found to have some erythematous spots in his bladder. Because of his risk of bladder cancer I recommended proceeding with bladder biopsy and imaging of his upper tract with retrograde pyelograms.   Procedure in detail: After informed consent was obtained, the patient was taken to the operating room. They were placed in the supine position. SCDs were turned on and in place. IV antibiotics were infused, and general anesthesia was induced. A timeout was performed in which the correct patient, surgical site, and procedure were identified and agreed upon by the team.  The patient was placed in a dorsolithotomy position, making sure to pad all pertinent neurovascular pressure points. The genitals were prepped and draped in the usual sterile fashion.   I attempted to place a rigid cystoscope, but due to his rigid penile prosthesis, I was unable to navigate this all the way into the bladder. I did encounter a mild bulbar urethra stricture which I was able to pass through with the scope with ease. I withdrew the scope and placed a flexible cystoscope. Evaluation of the bladder revealed erythema around his previous bladder tumor site, but negative for papillary bladder tumors or erythema elsewhere.  I loaded a 5  [place] ureter catheter through the cystoscope and cannulated the right and left ureter orifices. I injected contrast to obtain retrograde pyelograms. The entirety of the collecting systems were evaluated bilaterally, and there were found to be negative for filling defects.  While I was waiting for the long cystoscopy instruments, I obtained several biopsies of the erythematous bladder areas with "Bigopsy" forceps through the flexible cystoscope. I then obtained the long cystoscopy instruments and I was able to place this through the urethra and into the bladder. Cold cup biopsy forceps were used to biopsy the area of erythema. Bugbee electrode was used to fulgurate the biopsy sites. There was good hemostasis.   The bladder was drained, 10 cc of lidocaine jelly were placed into his urethra, and the procedure was completed. He was placed back in a supine position, anesthesia was reversed, and he was taken to the PACU in stable condition.  He will be discharged home with followup next week. I will contact him with biopsy results when available.

## 2012-11-30 NOTE — Transfer of Care (Signed)
Immediate Anesthesia Transfer of Care Note  Patient: [name] Small  Procedure(s) Performed: Procedure(s) (LRB): CYSTOSCOPY WITH RETROGRADE PYELOGRAM (Bilateral) CYSTOSCOPY WITH BIOPSY (N/A)  Patient Location: PACU  Anesthesia Type: General  Level of Consciousness: awake, sedated, patient cooperative and responds to stimulation  Airway & Oxygen Therapy: Patient Spontanous Breathing and Patient connected to face mask oxygen  Post-op Assessment: Report given to PACU RN, Post -op Vital signs reviewed and stable and Patient moving all extremities  Post vital signs: Reviewed and stable  Complications: No apparent anesthesia complications

## 2012-11-30 NOTE — Anesthesia Postprocedure Evaluation (Signed)
  Anesthesia Post-op Note  Patient: [name] Small  Procedure(s) Performed: Procedure(s) (LRB): CYSTOSCOPY WITH RETROGRADE PYELOGRAM (Bilateral) CYSTOSCOPY WITH BIOPSY (N/A)  Patient Location: PACU  Anesthesia Type: General  Level of Consciousness: awake and alert   Airway and Oxygen Therapy: Patient Spontanous Breathing  Post-op Pain: mild  Post-op Assessment: Post-op Vital signs reviewed, Patient's Cardiovascular Status Stable, Respiratory Function Stable, Patent Airway and No signs of Nausea or vomiting  Last Vitals:  Filed Vitals:   11/30/12 1352  BP: 141/63  Pulse:   Temp: 36.3 C  Resp: 13    Post-op Vital Signs: stable   Complications: No apparent anesthesia complications

## 2012-11-30 NOTE — Progress Notes (Addendum)
Dr. Winfred Leeds in and assessed pt.  Order received for albuterol rx. Pt and family informed that he will need to stay overnite .  Dr Jasmine December paged and informed of failure of O2 sats to improve despite vigorus c, db and IS.  Dr Winfred Leeds  Recommends pt stay ower. Dr. Jasmine December informed of only 1300 cc iv fluid received. Pt drank 2 cups coffee and 1 can soda.orders recived for cbc, chem 7 and bnp.  Pt and family informed.

## 2012-11-30 NOTE — Progress Notes (Signed)
Labs drawn

## 2012-11-30 NOTE — Progress Notes (Addendum)
Dr. Winfred Leeds updated O2 Sats 76- 86 on rm air despite vigorus  resp toiletry .  C,db and IS.  He only pulls 1040ml. Placed on 1 l [place] sats improved to 90%. Pt needs ower, and let Dr. Jasmine December know, per Dr. Winfred Leeds.

## 2012-12-01 ENCOUNTER — Encounter (HOSPITAL_BASED_OUTPATIENT_CLINIC_OR_DEPARTMENT_OTHER): Payer: Self-pay | Admitting: Urology

## 2012-12-01 LAB — BASIC METABOLIC PANEL
BUN: 38 mg/dL — ABNORMAL HIGH (ref 6–23)
CO2: 31 mEq/L (ref 19–32)
Calcium: 8.5 mg/dL (ref 8.4–10.5)
Chloride: 97 mEq/L (ref 96–112)
Creatinine, Ser: 2.07 mg/dL — ABNORMAL HIGH (ref 0.50–1.35)
GFR calc Af Amer: 33 mL/min — ABNORMAL LOW (ref >90)
GFR calc non Af Amer: 28 mL/min — ABNORMAL LOW (ref >90)
Glucose, Bld: 79 mg/dL (ref 70–99)
Potassium: 4.9 mEq/L (ref 3.5–5.1)
Sodium: 138 mEq/L (ref 135–145)

## 2012-12-01 LAB — GLUCOSE, CAPILLARY
Glucose-Capillary: 65 mg/dL — ABNORMAL LOW (ref 70–99)
Glucose-Capillary: 91 mg/dL (ref 70–99)

## 2012-12-01 NOTE — Addendum Note (Signed)
Addendum  created 12/01/12 0859 by Salley Scarlet, MD   Modules edited:Inpatient Notes

## 2012-12-01 NOTE — Progress Notes (Signed)
Urology Progress Note  Subjective:     No acute urologic events overnight. Admitted following surgery for mild hypoxia. His SOB was at baseline following surgery. He states he continues to be at baseline. CXR showed mild pulmonary edema; treated with oxygen per  and diuresis with lasix. He was able to titrate his oxygen off and is saturating 94% on room air. Voiding without difficulty.  ROS: Negative: chest pain  Objective:  Patient Vitals for the past 24 hrs:  BP Temp Temp src Pulse Resp SpO2 Height Weight  12/01/12 0459 119/57 mmHg 97.7 F (36.5 C) Oral 70  16  94 % - -  12/01/12 0250 107/52 mmHg 97.5 F (36.4 C) Oral 69  16  97 % - -  11/30/12 2005 118/72 mmHg 97.4 F (36.3 C) Axillary 88  20  93 % 6\' 2"  (1.88 m) 101.1 kg (222 lb 14.2 oz)  11/30/12 1930 133/68 mmHg 96.8 F (36 C) - 68  16  95 % - -  11/30/12 1900 - - - - - 91 % - -  11/30/12 1814 - - - - - 92 % - -  11/30/12 1745 - - - - - 96 % - -  11/30/12 1730 - - - - - 92 % - -  11/30/12 1719 - - - - - 85 % - -  11/30/12 1715 - - - - - 92 % - -  11/30/12 1645 - - - - - 84 % - -  11/30/12 1615 - - - - - 89 % - -  11/30/12 1545 - - - - - 90 % - -  11/30/12 1530 - - - 60  11  97 % - -  11/30/12 1525 129/54 mmHg - - 60  12  95 % - -  11/30/12 1515 107/55 mmHg - - 63  13  98 % - -  11/30/12 1500 132/52 mmHg - - 65  11  95 % - -  11/30/12 1445 130/58 mmHg - - 68  12  71 % - -  11/30/12 1430 130/57 mmHg - - 67  25  99 % - -  11/30/12 1415 139/63 mmHg - - 70  14  100 % - -  11/30/12 1400 128/66 mmHg - - 73  15  100 % - -  11/30/12 1352 141/63 mmHg 97.3 F (36.3 C) - - 13  - - -  11/30/12 1200 147/71 mmHg 97.1 F (36.2 C) Oral 64  20  93 % - 99.338 kg (219 lb)    Physical Exam: General:  No acute distress, awake Cardiovascular:    [x]   S1/S2 present, RRR  []   Irregularly irregular Chest:  CTA-B Abdomen:               []  Soft, appropriately TTP  [x]  Soft, NTTP  []  Soft, appropriately TTP, incision(s)  clean/dry/intact  Genitourinary: No catheter. Ext: No edema    I/O last 3 completed shifts: In: 2360 [P.O.:960; I.V.:1400] Out: 100 [Urine:100] Urine output incorrect as patient has been voiding.  Recent Labs  [facility] 11/30/12 1725 11/30/12 1127   HGB 14.6 16.7   WBC 9.6 --   PLT 178 --    Recent Labs  [facility] 12/01/12 0458 11/30/12 1725   NA 138 141   K 4.9 4.6   CL 97 101   CO2 31 30   BUN 38* 39*   CREATININE 2.07* 1.76*   CALCIUM 8.5 8.5   GFRNONAA 28* 34*  GFRAA 33* 40*     No results found for this basename: PT:2,INR:2,APTT:2 in the last 72 hours   No components found with this basename: ABG:2    Length of stay: 1 days.  Assessment: Bladder cancer. Post-op hypoxia. POD#1 Bladder biopsy   Plan: -Hypoxia resolved with gentle diuresis. Discontinue CXR which was ordered for this morning. -Discharge home. Patient already has printed Rx's from surgery from yesterday. -Follow up with PCP as scheduled in early February 2014 to address SOB issues.   [name], MD [phone]

## 2012-12-01 NOTE — Care Management Note (Signed)
    Page 1 of 1   12/01/2012     11:28:08 AM   CARE MANAGEMENT NOTE 12/01/2012  Patient:  [name] Small, [name] Small   Account Number:  [id number]  Date Initiated:  12/01/2012  Documentation initiated by:  Dessa Phi  Subjective/Objective Assessment:   ADMITTED W/BLADDER CA.     Action/Plan:   FROM HOME.HAS PCP,PHARMACY.   Anticipated DC Date:  12/01/2012   Anticipated DC Plan:  [place]  CM consult      Choice offered to / List presented to:             Status of service:  Completed, signed off Medicare Important Message given?   (If response is "NO", the following Medicare IM given date fields will be blank) Date Medicare IM given:   Date Additional Medicare IM given:    Discharge Disposition:  HOME/SELF CARE  Per UR Regulation:  Reviewed for med. necessity/level of care/duration of stay  If discussed at [place] of Stay Meetings, dates discussed:    Comments:  12/01/12 Denim Start RN,BSN NCM [phone] S/P BLADDER BX,PULM EDEMA-LASIX.

## 2012-12-01 NOTE — Discharge Summary (Signed)
Physician Discharge Summary  Patient ID: MARQUIST DUMAINE MRN: [id number] DOB/AGE: 18-Dec-1927 79 y.o.  Admit date: 11/30/2012 Discharge date: 12/01/2012  Admission Diagnoses: Hypoxia  Discharge Diagnoses:  Hypoxia  Discharged Condition: good  Hospital Course:  This patient was admitted following bladder biopsy due to bladder cancer. This was planned to be an outpatient procedure, but postoperatively the patient could not maintain his oxygen saturation above 90% without 2 L of oxygen per nasal cannula. CBC, BMP, and BNP were all within normal limits postoperatively. He had mild pulmonary edema on postoperative chest x-ray. He was admitted overnight for oxygen therapy and diuresis with one dose of IV Lasix. He was able to have his oxygen titrated to room air in which he was saturating 94%. It was felt that he could be discharged home. I advised him to followup with his PCP regarding his baseline shortness of breath; he already has a followup appointment in early February.  Consults: None  Significant Diagnostic Studies: labs: BNP 107. CXR- mild pulmonary edema.  Treatments: Diuresis.  Discharge Exam: Blood pressure 119/57, pulse 70, temperature 97.7 F (36.5 C), temperature source Oral, resp. rate 16, height 6\' 2"  (1.88 m), weight 101.1 kg (222 lb 14.2 oz), SpO2 94.00%. Refer to PE from progress note on date of discharge.  Disposition: 01-Home or Self Care  Discharge Orders    Future Appointments: Provider: Department: Dept Phone: Center:   12/08/2012 9:45 AM [facility] [place]   12/14/2012 10:30 AM Tonia Ghent, MD [place] [facility] [phone] [id number]     Future Orders Please Complete By Expires   Discharge patient      Discharge patient          Medication List     As of 12/01/2012  8:49 AM    TAKE these medications         allopurinol 100 MG tablet   Commonly known as: ZYLOPRIM   Take 1  tablet (100 mg total) by mouth daily.      fluocinonide-emollient 0.05 % cream   Commonly known as: LIDEX-E   Apply topically 2 (two) times daily.      gabapentin 300 MG capsule   Commonly known as: NEURONTIN   Take 1 capsule (300 mg total) by mouth 2 (two) times daily as needed.      glipiZIDE 10 MG 24 hr tablet   Commonly known as: GLUCOTROL XL   Take 1 tablet (10 mg total) by mouth daily.      HYDROcodone-acetaminophen 5-325 MG per tablet   Commonly known as: NORCO/VICODIN   Take 1-2 tablets by mouth every 4 (four) hours as needed for pain.      hyoscyamine 0.125 MG tablet   Commonly known as: LEVSIN, ANASPAZ   Take 1 tablet (0.125 mg total) by mouth every 4 (four) hours as needed for cramping (bladder spasms).      insulin glargine 100 UNIT/ML injection   Commonly known as: LANTUS   Inject 12 Units into the skin daily. Dispense 1 box of 5 pens      losartan 50 MG tablet   Commonly known as: COZAAR   Take 1 tablet (50 mg total) by mouth daily. Take instead of benicar or lisinopril      PEN NEEDLES 31GX5/16" 31G X 8 MM Misc   Use daily with lantus solostar pen as directed      phenazopyridine 100 MG tablet   Commonly known as: PYRIDIUM  Take 1 tablet (100 mg total) by mouth every 8 (eight) hours as needed for pain (Burning urination.  Will turn urine and body fluids orange.).      senna-docusate 8.6-50 MG per tablet   Commonly known as: Senokot-S   Take 1 tablet by mouth 2 (two) times daily.      torsemide 10 MG tablet   Commonly known as: DEMADEX   Take one tablet by mouth on Monday, Wednesday and Friday only.           Follow-up Information    Follow up with Molli Hazard, MD. On 12/07/2012. (10:15 am)    Contact information:   [place] [address], [place] [address] [phone]          Signed: Molli Hazard 12/01/2012, 8:49 AM  CC. Dr. [name]

## 2012-12-01 NOTE — Progress Notes (Addendum)
Events of yesterday afternoon noted. Postoperative hypoxia has resolved with diuresis.  Discussed with patient. Questions answered.

## 2012-12-08 ENCOUNTER — Other Ambulatory Visit (INDEPENDENT_AMBULATORY_CARE_PROVIDER_SITE_OTHER): Payer: Medicare Other

## 2012-12-08 DIAGNOSIS — E1139 Type 2 diabetes mellitus with other diabetic ophthalmic complication: Secondary | ICD-10-CM

## 2012-12-08 LAB — HEMOGLOBIN A1C: Hgb A1c MFr Bld: 8.7 % — ABNORMAL HIGH (ref 4.6–6.5)

## 2012-12-14 ENCOUNTER — Ambulatory Visit (INDEPENDENT_AMBULATORY_CARE_PROVIDER_SITE_OTHER): Payer: Medicare Other | Admitting: Family Medicine

## 2012-12-14 ENCOUNTER — Encounter: Payer: Self-pay | Admitting: Family Medicine

## 2012-12-14 VITALS — BP 122/56 | HR 66 | Temp 97.6°F | Wt 222.0 lb

## 2012-12-14 DIAGNOSIS — E1139 Type 2 diabetes mellitus with other diabetic ophthalmic complication: Secondary | ICD-10-CM

## 2012-12-14 DIAGNOSIS — R0602 Shortness of breath: Secondary | ICD-10-CM

## 2012-12-14 DIAGNOSIS — R0600 Dyspnea, unspecified: Secondary | ICD-10-CM

## 2012-12-14 DIAGNOSIS — R0609 Other forms of dyspnea: Secondary | ICD-10-CM

## 2012-12-14 DIAGNOSIS — E119 Type 2 diabetes mellitus without complications: Secondary | ICD-10-CM

## 2012-12-14 DIAGNOSIS — E11319 Type 2 diabetes mellitus with unspecified diabetic retinopathy without macular edema: Secondary | ICD-10-CM

## 2012-12-14 DIAGNOSIS — R0989 Other specified symptoms and signs involving the circulatory and respiratory systems: Secondary | ICD-10-CM

## 2012-12-14 LAB — TSH: TSH: 1.17 u[IU]/mL (ref 0.35–5.50)

## 2012-12-14 MED ORDER — GLIPIZIDE ER 10 MG PO TB24: 10.0000 mg | ORAL_TABLET | Freq: Every day | ORAL | Status: DC

## 2012-12-14 MED ORDER — TORSEMIDE 10 MG PO TABS: ORAL_TABLET | ORAL | Status: DC

## 2012-12-14 NOTE — Patient Instructions (Signed)
We'll contact you with your lab report. See Rosaria Ferries about your referral before you leave today. Recheck in 3 months, A1c ahead of time.  Try not to miss insulin doses.

## 2012-12-15 ENCOUNTER — Ambulatory Visit: Payer: Medicare Other | Admitting: Family Medicine

## 2012-12-15 NOTE — Progress Notes (Signed)
DM2 f/u.  Labs discussed.  He's missing some lantus doses.  No ADE from meds.  D/w pt about compliance and diet.   Episodic SOB.  Going on for years.  Irregular onset.  Prev with cath done 2 years ago for similar.  Recently with uro tx, needing overnight obs at hospital for SOB.  CXR unremarkable.  EKG post procedure with apparent AF, f/u NSR on EKG a few minutes later.  No CP.  Not SOB currently.  No BLE.  No known palpitations.    Meds, vitals, and allergies reviewed.   ROS: See HPI.  Otherwise, noncontributory.  nad ncat Mmm Neck supple, no TMG rrr ctab abd soft, not ttp  Ext w/o edema  EKG reviewed.

## 2012-12-15 NOTE — Assessment & Plan Note (Signed)
I question if he is having episodic AF.  Would refer back to cards for consideration of monitoring.  TSH wnl.  D/w pt.  He agrees with referral.  NSR today.  >25 min spent with face to face with patient, >50% counseling and/or coordinating care

## 2012-12-15 NOTE — Assessment & Plan Note (Signed)
D/w pt about diet and sticking with meds.  He understood.  Recheck in about 3 months.

## 2012-12-16 ENCOUNTER — Encounter: Payer: Self-pay | Admitting: Physician Assistant

## 2012-12-16 ENCOUNTER — Ambulatory Visit (INDEPENDENT_AMBULATORY_CARE_PROVIDER_SITE_OTHER): Payer: Medicare Other | Admitting: Physician Assistant

## 2012-12-16 VITALS — BP 138/60 | HR 67 | Ht 74.0 in | Wt 221.2 lb

## 2012-12-16 DIAGNOSIS — R062 Wheezing: Secondary | ICD-10-CM

## 2012-12-16 DIAGNOSIS — I4891 Unspecified atrial fibrillation: Secondary | ICD-10-CM

## 2012-12-16 DIAGNOSIS — I10 Essential (primary) hypertension: Secondary | ICD-10-CM

## 2012-12-16 DIAGNOSIS — R0600 Dyspnea, unspecified: Secondary | ICD-10-CM

## 2012-12-16 DIAGNOSIS — I2510 Atherosclerotic heart disease of native coronary artery without angina pectoris: Secondary | ICD-10-CM

## 2012-12-16 DIAGNOSIS — R0989 Other specified symptoms and signs involving the circulatory and respiratory systems: Secondary | ICD-10-CM

## 2012-12-16 DIAGNOSIS — R0609 Other forms of dyspnea: Secondary | ICD-10-CM

## 2012-12-16 DIAGNOSIS — I509 Heart failure, unspecified: Secondary | ICD-10-CM

## 2012-12-16 LAB — BASIC METABOLIC PANEL
BUN: 38 mg/dL — ABNORMAL HIGH (ref 6–23)
CO2: 34 mEq/L — ABNORMAL HIGH (ref 19–32)
Calcium: 8.6 mg/dL (ref 8.4–10.5)
Chloride: 99 mEq/L (ref 96–112)
Creatinine, Ser: 1.9 mg/dL — ABNORMAL HIGH (ref 0.4–1.5)
GFR: 36.91 mL/min — ABNORMAL LOW (ref >60.00)
Glucose, Bld: 156 mg/dL — ABNORMAL HIGH (ref 70–99)
Potassium: 5.2 mEq/L — ABNORMAL HIGH (ref 3.5–5.1)
Sodium: 138 mEq/L (ref 135–145)

## 2012-12-16 LAB — BRAIN NATRIURETIC PEPTIDE: Pro B Natriuretic peptide (BNP): 46 pg/mL (ref 0.0–100.0)

## 2012-12-16 NOTE — Assessment & Plan Note (Signed)
Patient had an episode of congestive heart failure when undergoing bladder biopsy on 11/30/12. He diuresed with IV Lasix. We will order a 2-D echo to assess LV function and increase his Demadex to 10 mg once daily. We will check of the BMET and BNP today as well as next week. His last creatinine while he was in the hospital was 2.07.

## 2012-12-16 NOTE — Progress Notes (Signed)
HPI:  This is an 79 year old male patient who comes in today for followup after recent hospitalization. He underwent a bladder biopsy on 11/30/12 and became hypoxic and chest x-ray showed mild pulmonary edema. He was kept overnight and diuresed with one dose of IV Lasix. His initial EKG after the bladder biopsy shows atrial fibrillation but he quickly converted to normal sinus rhythm. He has no history of having atrial fibrillation in the past.  The patient is followed in our clinic but Dr. Gershon Mussel wall. He has history of nonobstructive coronary disease on catheter on 04/25/11 with mid LAD at 30%, proximal obtuse marginal 30%, proximal RCA 20%, mid RCA. A 40%. He had normal cardiac output a mildly elevated filling pressures but no significant pulmonary hypertension. Echo in May 2012 ejection fraction was 50-55% with left atrial enlargement. He also has chronic kidney disease, hypertension, and diabetes mellitus.  Patient states he has chronic shortness of breath. He says it comes and goes and can occur at rest or with exertion. He says his main complaint is he is weak and tired. He was started on Demadex 10 mg 3 times a week. He denies chest pain, palpitations, or presyncope. He occasionally gets dizzy if he stands up too quickly.  Allergies:  -- Ace Inhibitors    --  cough  -- Actos (Pioglitazone Hydrochloride)    --  Held 2012 bladder cancer  -- Aspirin    --  Held 2012 due to hematuria and bruising.  -- Bactrim    --  Rash, presumed allergy  -- Sulfa Drugs Cross Reactors    --  rash  Current Outpatient Prescriptions on File Prior to Visit: allopurinol (ZYLOPRIM) 100 MG tablet, Take 1 tablet (100 mg total) by mouth daily., Disp: 90 tablet, Rfl: 3 fluocinonide-emollient (LIDEX-E) 0.05 % cream, Apply topically 2 (two) times daily., Disp: 30 g, Rfl: 3 gabapentin (NEURONTIN) 300 MG capsule, Take 1 capsule (300 mg total) by mouth 2 (two) times daily as needed., Disp: 60 capsule, Rfl: 3 glipiZIDE  (GLUCOTROL XL) 10 MG 24 hr tablet, Take 1 tablet (10 mg total) by mouth daily., Disp: 90 tablet, Rfl: 3 hyoscyamine (LEVSIN, ANASPAZ) 0.125 MG tablet, Take 1 tablet (0.125 mg total) by mouth every 4 (four) hours as needed for cramping (bladder spasms)., Disp: 40 tablet, Rfl: 4 insulin glargine (LANTUS) 100 UNIT/ML injection, Inject 12 Units into the skin daily. Dispense 1 box of 5 pens, Disp: 5 pen, Rfl: 1 Insulin Pen Needle (PEN NEEDLES 31GX5/16") 31G X 8 MM MISC, Use daily with lantus solostar pen as directed, Disp: 100 each, Rfl: 1 losartan (COZAAR) 50 MG tablet, Take 1 tablet (50 mg total) by mouth daily. Take instead of benicar or lisinopril, Disp: 90 tablet, Rfl: 3 torsemide (DEMADEX) 10 MG tablet, Take one tablet daily, Disp: , Rfl:     Past Medical History:   BENIGN PROSTATIC HYPERTROPHY                    05/21/2007    DIABETES MELLITUS, TYPE II                      05/21/2007    DIVERTICULOSIS, COLON                           05/21/2007      Comment:pt denies   HYPERTENSION  05/21/2007    PSORIASIS, SCALP                                10/25/2008   Cellulitis of left foot                                        Comment:Hospitalized in 2006   Gross hematuria                                                Comment:pt denies   Psoriasis                                       ELBOWS       Fatigue                                         AGE-RELAT*   Arthritis                                                    CAD (coronary artery disease)                   CARDIOLOG*     Comment:nonobstructive by cath 6/12:  mid LAD 30%,               proximal obtuse marginal-2 30%, proximal RCA               20%, mid RCA 30-40%.  He had normal cardiac               output and mildly elevated filling pressures               but no significant pulmonary hypertension;                 Echocardiogram in May 2012 demonstrated EF               50-55% and left atrial enlargement     Impaired hearing                                BILATERAL*   Bladder cancer                                                 Comment:2013   Chronic kidney disease (CKD), stage III (moder* 12/04/2007     Comment:FOLLOWED BY DR PATEL  Past Surgical History:   VASECTOMY                                       15 YRS AG*  PENILE PROSTHESIS IMPLANT                       15 YRS AGO   CARDIOVASCULAR STRESS TEST                      2007         CARDIAC CATHETERIZATION                         04-24-11/*     Comment:MILD NONOBSTRUCTIVE CAD, NORMA CARDIAC OUTPUT   APPENDECTOMY                                    AGE 56        TONSILLECTOMY AND ADENOIDECTOMY                 AGE 48 YR*   CATARACT EXTRACTION W/ INTRAOCULAR LENS  IMPLA*              INCISION AND DRAINAGE FOOT                      LEFT FOOT*   CYSTOSCOPY W/ RETROGRADES                       10/30/2011     Comment:Procedure: CYSTOSCOPY WITH RETROGRADE               PYELOGRAM;  Surgeon: Molli Hazard, MD;              Location: [facility];  Service:              Urology;  Laterality: Bilateral;  CYSTOSCOPY               POSS TURBT BILATERAL RETROGRADE PYLEOGRAMPOSS              BILATERAL URETEROSCOPY WITH BIOPSYLASER               ABLATION OF LESIONPOSS BILATERAL URETERAL               STENT PLACEMENT CARMFLEXIBLE URETERAL               SCOPEDIGITAL URETERAL [place] RESECTION OF BLADDER TUMOR        10/30/2011     Comment:Procedure: TRANSURETHRAL RESECTION OF BLADDER               TUMOR (TURBT);  Surgeon: Molli Hazard,              MD;  Location: [facility];                Service: Urology;  Laterality: N/A;   CYSTOSCOPY                                      12/25/2011      Comment:Procedure: CYSTOSCOPY;  Surgeon: Molli Hazard, MD;  Location: [facility];  Service: Urology;  Laterality: N/A;  needs intubation and to be paralyzed   TRANSURETHRAL RESECTION OF BLADDER TUMOR        12/25/2011      Comment:Procedure: TRANSURETHRAL RESECTION OF BLADDER               TUMOR (TURBT);  Surgeon: Molli Hazard,              MD;  Location: [facility];                Service: Urology;  Laterality: N/A;  need long               gyrus instrumentsDr states he has spoken w/               Nelwyn Salisbury regarding this   CYSTOSCOPY W/ RETROGRADES                       11/30/2012      Comment:Procedure: CYSTOSCOPY WITH RETROGRADE               PYELOGRAM;  Surgeon: Molli Hazard, MD;              Location: [facility];  Service:              Urology;  Laterality: Bilateral;  Flexible               cystoscopy.   CYSTOSCOPY WITH BIOPSY                          11/30/2012      Comment:Procedure: CYSTOSCOPY WITH BIOPSY;  Surgeon:               Molli Hazard, MD;  Location: [facility];  Service: Urology;                Laterality: N/A;  Review of patient's family history indicates:   Diabetes                       Mother                   Heart disease                  Brother                  Heart disease                  Brother                  Heart disease                  Brother                  Heart disease                  Brother                  Heart disease                  Brother                  Heart disease  Brother                  Social History   Marital Status: Married             Spouse Name:                      Years of Education:                 Number of children:             Occupational History   None on file  Social History Main Topics   Smoking Status: Former Smoker                   Packs/Day:       Years:           Quit date: 11/11/1968   Smokeless Status: Never Used                       Alcohol Use: Yes           1.2 oz/week      2 Glasses of wine per week       Comment: Rare   Drug Use: No             Sexual Activity: Not on file        Other Topics            Concern   None on file  Social History Narrative   Retired Event organiser.   Active with golf.    ROS:see history of present illness otherwise negative   PHYSICAL EXAM: Well-nournished, in no acute distress. Wheezes a little when he talks but no wheezing on exam Neck: No JVD, HJR, Bruit, or thyroid enlargement  Lungs: decreased breath sounds with fine crackles at the bases  Cardiovascular: RRR, PMI not displaced, heart sounds normal, no murmurs, gallops, bruit, thrill, or heave.  Abdomen: BS normal. Soft without organomegaly, masses, lesions or tenderness.  Extremities: without cyanosis, clubbing or edema. Good distal pulses bilateral  SKin: Warm, no lesions or rashes   Musculoskeletal: No deformities  Neuro: no focal signs  BP 138/60  Pulse 67  Ht 6\' 2"  (1.88 m)  Wt 221 lb 3.2 oz (100.336 kg)  BMI 28.40 kg/m2   [id number] sinus rhythm no acute change

## 2012-12-16 NOTE — Patient Instructions (Addendum)
Take demadex(torsemide ) 10mg  daily.  Your physician recommends that you have lab work today--BMET/BNP.  Your physician recommends that you return for lab work in: 1 week--BMET.  Your physician has requested that you have an echocardiogram. Echocardiography is a painless test that uses sound waves to create images of your heart. It provides your doctor with information about the size and shape of your heart and how well your heart's chambers and valves are working. This procedure takes approximately one hour. There are no restrictions for this procedure.   Your physician has recommended that you wear an event monitor. Event monitors are medical devices that record the heart's electrical activity. Doctors most often [place] these monitors to diagnose arrhythmias. Arrhythmias are problems with the speed or rhythm of the heartbeat. The monitor is a small, portable device. You can wear one while you do your normal daily activities. This is usually used to diagnose what is causing palpitations/syncope (passing out). 30 day event monitor    You have been referred to [facility] for evaluation of wheezing and shortness of breath.  Your physician recommends that you schedule a follow-up appointment in: 1 month with Dr Verl Blalock.

## 2012-12-16 NOTE — Assessment & Plan Note (Signed)
The patient has chronic dyspnea and does have some wheezing at rest. I believe he has some underlying lung disease and have referred him to pulmonary for further workup

## 2012-12-16 NOTE — Assessment & Plan Note (Signed)
Stable

## 2012-12-16 NOTE — Assessment & Plan Note (Signed)
The patient had a brief episode of 8 or fibrillation after his bladder biopsy when he went into congestive heart failure. He has no history of ventral fibrillation and quickly converted to normal sinus rhythm. We will check another recorder to see if he is going in and out of atrial fibrillation when he is short of breath. He will followup with Dr.Wall

## 2012-12-16 NOTE — Assessment & Plan Note (Signed)
Stable without chest pain 

## 2012-12-22 ENCOUNTER — Other Ambulatory Visit: Payer: Self-pay | Admitting: *Deleted

## 2012-12-22 MED ORDER — GABAPENTIN 300 MG PO CAPS: 300.0000 mg | ORAL_CAPSULE | Freq: Two times a day (BID) | ORAL | Status: DC | PRN

## 2012-12-22 NOTE — Telephone Encounter (Signed)
Sent!

## 2012-12-22 NOTE — Telephone Encounter (Signed)
Ok to refill 

## 2012-12-23 ENCOUNTER — Other Ambulatory Visit (INDEPENDENT_AMBULATORY_CARE_PROVIDER_SITE_OTHER): Payer: Medicare Other

## 2012-12-23 ENCOUNTER — Other Ambulatory Visit: Payer: Self-pay

## 2012-12-23 DIAGNOSIS — R0600 Dyspnea, unspecified: Secondary | ICD-10-CM

## 2012-12-23 DIAGNOSIS — I4891 Unspecified atrial fibrillation: Secondary | ICD-10-CM

## 2012-12-23 DIAGNOSIS — R0609 Other forms of dyspnea: Secondary | ICD-10-CM

## 2012-12-23 DIAGNOSIS — R0989 Other specified symptoms and signs involving the circulatory and respiratory systems: Secondary | ICD-10-CM

## 2012-12-23 DIAGNOSIS — E119 Type 2 diabetes mellitus without complications: Secondary | ICD-10-CM

## 2012-12-23 LAB — BASIC METABOLIC PANEL
BUN: 60 mg/dL — ABNORMAL HIGH (ref 6–23)
CO2: 31 mEq/L (ref 19–32)
Calcium: 8.6 mg/dL (ref 8.4–10.5)
Chloride: 96 mEq/L (ref 96–112)
Creatinine, Ser: 2.3 mg/dL — ABNORMAL HIGH (ref 0.4–1.5)
GFR: 29.66 mL/min — ABNORMAL LOW (ref >60.00)
Glucose, Bld: 257 mg/dL — ABNORMAL HIGH (ref 70–99)
Potassium: 5.4 mEq/L — ABNORMAL HIGH (ref 3.5–5.1)
Sodium: 136 mEq/L (ref 135–145)

## 2012-12-23 MED ORDER — SODIUM POLYSTYRENE SULFONATE 15 GM/60ML PO SUSP: 15.0000 g | Freq: Once | ORAL | Status: AC

## 2012-12-23 NOTE — Telephone Encounter (Signed)
Pt left note requesting refill of high potassium med to go to [facility]. Also request lab results. Left v/m for pt to call back.

## 2012-12-23 NOTE — Telephone Encounter (Signed)
Left vm for pt to callback 

## 2012-12-23 NOTE — Telephone Encounter (Signed)
Please await labs and send to Dr. Verl Blalock for approval of Kayexalate.

## 2012-12-23 NOTE — Telephone Encounter (Signed)
[name] Small notified as instructed by telephone. Pt will wait to hear from ordering clinic.

## 2012-12-23 NOTE — Telephone Encounter (Signed)
Please call pt.  The ordering clinic should be in contact with him about the K results and the kayexelate dosing. I will defer to them.   I will forward the note to ordering site.

## 2012-12-25 ENCOUNTER — Other Ambulatory Visit: Payer: Medicare Other

## 2012-12-25 ENCOUNTER — Other Ambulatory Visit (HOSPITAL_COMMUNITY): Payer: Medicare Other

## 2012-12-28 ENCOUNTER — Other Ambulatory Visit: Payer: Self-pay | Admitting: *Deleted

## 2012-12-28 NOTE — Telephone Encounter (Signed)
Has anyone followed up on this? Needs repeat BMET tomorrow.

## 2012-12-29 ENCOUNTER — Other Ambulatory Visit (INDEPENDENT_AMBULATORY_CARE_PROVIDER_SITE_OTHER): Payer: Medicare Other

## 2012-12-29 DIAGNOSIS — E875 Hyperkalemia: Secondary | ICD-10-CM

## 2012-12-29 LAB — BASIC METABOLIC PANEL
BUN: 57 mg/dL — ABNORMAL HIGH (ref 6–23)
CO2: 32 mEq/L (ref 19–32)
Calcium: 8.3 mg/dL — ABNORMAL LOW (ref 8.4–10.5)
Chloride: 97 mEq/L (ref 96–112)
Creatinine, Ser: 2.2 mg/dL — ABNORMAL HIGH (ref 0.4–1.5)
GFR: 30.92 mL/min — ABNORMAL LOW (ref >60.00)
Glucose, Bld: 91 mg/dL (ref 70–99)
Potassium: 4.6 mEq/L (ref 3.5–5.1)
Sodium: 138 mEq/L (ref 135–145)

## 2013-01-05 ENCOUNTER — Telehealth: Payer: Self-pay | Admitting: *Deleted

## 2013-01-05 ENCOUNTER — Ambulatory Visit (HOSPITAL_COMMUNITY): Payer: Medicare Other | Attending: Cardiology | Admitting: Radiology

## 2013-01-05 ENCOUNTER — Ambulatory Visit (INDEPENDENT_AMBULATORY_CARE_PROVIDER_SITE_OTHER): Payer: Medicare Other | Admitting: *Deleted

## 2013-01-05 ENCOUNTER — Encounter (INDEPENDENT_AMBULATORY_CARE_PROVIDER_SITE_OTHER): Payer: Medicare Other

## 2013-01-05 DIAGNOSIS — I10 Essential (primary) hypertension: Secondary | ICD-10-CM

## 2013-01-05 DIAGNOSIS — I4891 Unspecified atrial fibrillation: Secondary | ICD-10-CM

## 2013-01-05 DIAGNOSIS — I509 Heart failure, unspecified: Secondary | ICD-10-CM

## 2013-01-05 DIAGNOSIS — R0609 Other forms of dyspnea: Secondary | ICD-10-CM | POA: Insufficient documentation

## 2013-01-05 DIAGNOSIS — E119 Type 2 diabetes mellitus without complications: Secondary | ICD-10-CM | POA: Insufficient documentation

## 2013-01-05 DIAGNOSIS — N189 Chronic kidney disease, unspecified: Secondary | ICD-10-CM | POA: Insufficient documentation

## 2013-01-05 DIAGNOSIS — R0989 Other specified symptoms and signs involving the circulatory and respiratory systems: Secondary | ICD-10-CM

## 2013-01-05 DIAGNOSIS — R0600 Dyspnea, unspecified: Secondary | ICD-10-CM

## 2013-01-05 DIAGNOSIS — I2510 Atherosclerotic heart disease of native coronary artery without angina pectoris: Secondary | ICD-10-CM

## 2013-01-05 LAB — BASIC METABOLIC PANEL
BUN: 47 mg/dL — ABNORMAL HIGH (ref 6–23)
CO2: 30 mEq/L (ref 19–32)
Calcium: 8.2 mg/dL — ABNORMAL LOW (ref 8.4–10.5)
Chloride: 99 mEq/L (ref 96–112)
Creatinine, Ser: 2 mg/dL — ABNORMAL HIGH (ref 0.4–1.5)
GFR: 34.96 mL/min — ABNORMAL LOW (ref >60.00)
Glucose, Bld: 229 mg/dL — ABNORMAL HIGH (ref 70–99)
Potassium: 5.2 mEq/L — ABNORMAL HIGH (ref 3.5–5.1)
Sodium: 137 mEq/L (ref 135–145)

## 2013-01-05 NOTE — Telephone Encounter (Signed)
30 day event monitor placed on Pt 01/05/13 TK

## 2013-01-05 NOTE — Progress Notes (Signed)
Echocardiogram performed.  

## 2013-01-06 ENCOUNTER — Ambulatory Visit (INDEPENDENT_AMBULATORY_CARE_PROVIDER_SITE_OTHER): Payer: Medicare Other | Admitting: Emergency Medicine

## 2013-01-06 ENCOUNTER — Ambulatory Visit (INDEPENDENT_AMBULATORY_CARE_PROVIDER_SITE_OTHER)
Admission: RE | Admit: 2013-01-06 | Discharge: 2013-01-06 | Disposition: A | Discharge status: Home / Self Care | Payer: Medicare Other | Source: Ambulatory Visit | Attending: Emergency Medicine | Admitting: Emergency Medicine

## 2013-01-06 ENCOUNTER — Encounter: Payer: Self-pay | Admitting: Emergency Medicine

## 2013-01-06 ENCOUNTER — Telehealth: Payer: Self-pay | Admitting: *Deleted

## 2013-01-06 VITALS — BP 132/68 | HR 71 | Ht 74.0 in | Wt 225.4 lb

## 2013-01-06 DIAGNOSIS — R0989 Other specified symptoms and signs involving the circulatory and respiratory systems: Secondary | ICD-10-CM

## 2013-01-06 DIAGNOSIS — R0600 Dyspnea, unspecified: Secondary | ICD-10-CM

## 2013-01-06 DIAGNOSIS — R0609 Other forms of dyspnea: Secondary | ICD-10-CM

## 2013-01-06 MED ORDER — SODIUM POLYSTYRENE SULFONATE 15 GM/60ML PO SUSP: 15.0000 g | Freq: Once | ORAL | Status: DC

## 2013-01-06 NOTE — Assessment & Plan Note (Signed)
Suspect contributions from undiagnosed COPD. Consider also anginal equivalent or transient A Fib (beiing evaluated). With pleural plaque on Ct scan 2006 and interstitial changes on CXR recently, must consider an occult asbestos exposure and ILD.  - recheck CXR today to see if there has been clearing post-diuresis - consider repeat CT scan chest - full PFT at rov

## 2013-01-06 NOTE — Telephone Encounter (Signed)
Message copied by Verna Czech on Wed Jan 06, 2013 10:18 AM ------      Message from: Jenell Milliner C      Created: Tue Jan 05, 2013  5:06 PM       His potassium is up again. He has Kayexalate at home. Have him stop his losartan. Monitor blood pressure. If blood pressure greater than 140/90 will add hydralazine 25 mg 3 times a day. ------

## 2013-01-06 NOTE — Progress Notes (Signed)
Subjective:    Patient ID: [name] Small, male    DOB: 1927-11-29, 79 y.o.   MRN: [id number]  HPI 79 yo man, former tobacco (55 pk-yrs), hx of CAD, HTN, preserved LV fxn, renal insuff Posey Pronto), DM. He was admitted 11/30/12 after a bladder bx (for Bladder CA) due to new A fib and associated flash pulm edema that responded to diuretics. His A fib resolved and was felt to be self-limited. He has been followed at [facility] by Dr Verl Blalock, and on recent eval described longer-standing and wheeze. A loop recorder was planned to better eval his rhythm and he was referred here as well. No feedback yet from the event recorder.   Describes DOE that has been bothersome for 2- 3 yrs. Not associated with cough or pain, no dizziness. He feels lower energy and strength at those times. His wife hears wheezing, especially at night, comes and goes.   Review of his imaging > R pleural calcifications identified on CT scan 01/2005, along w effusions.   CXR 1/20 > subtle interstitial prominence, ? pulm edema.    Review of Systems  Constitutional: Negative for fever and unexpected weight change.  HENT: Negative for ear pain, nosebleeds, congestion, sore throat, rhinorrhea, sneezing, trouble swallowing, dental problem, postnasal drip and sinus pressure.   Eyes: Negative for redness and itching.  Respiratory: Positive for shortness of breath. Negative for cough, chest tightness and wheezing.   Cardiovascular: Positive for palpitations. Negative for leg swelling.  Gastrointestinal: Negative for nausea and vomiting.  Genitourinary: Negative for dysuria.  Musculoskeletal: Negative for joint swelling.  Skin: Negative for rash.  Neurological: Negative for headaches.  Hematological: Does not bruise/bleed easily.  Psychiatric/Behavioral: Negative for dysphoric mood. The patient is not nervous/anxious.     Past Medical History  Diagnosis Date  . BENIGN PROSTATIC HYPERTROPHY 05/21/2007  . DIABETES MELLITUS, TYPE II  05/21/2007  . DIVERTICULOSIS, COLON 05/21/2007    pt denies  . HYPERTENSION 05/21/2007  . PSORIASIS, SCALP 10/25/2008  . Cellulitis of left foot     Hospitalized in 2006  . Gross hematuria     pt denies  . Psoriasis ELBOWS  . Fatigue AGE-RELATED  . Arthritis   . CAD (coronary artery disease) CARDIOLOGIST- DR WALL - VISIT IN JUNE 2012 FOR CATH    nonobstructive by cath 6/12:  mid LAD 30%, proximal obtuse marginal-2 30%, proximal RCA 20%, mid RCA 30-40%.  He had normal cardiac output and mildly elevated filling pressures but no significant pulmonary hypertension;   Echocardiogram in May 2012 demonstrated EF 50-55% and left atrial enlargement   . Impaired hearing BILATERAL HEARING AIDS  . Bladder cancer     2013  . Chronic kidney disease (CKD), stage III (moderate) 12/04/2007    FOLLOWED BY DR PATEL     Family History  Problem Relation Age of Onset  . Diabetes Mother   . Heart disease Brother   . Heart disease Brother   . Heart disease Brother   . Heart disease Brother   . Heart disease Brother   . Heart disease Brother   . Cancer Brother     lung     History   Social History  . Marital Status: Married    Spouse Name: N/A    Number of Children: 4  . Years of Education: N/A   Occupational History  . retired      [employer]	[place].   Social History Main Topics  . Smoking status: Former Smoker -- 2.00  packs/day for 22 years    Types: Cigarettes    Quit date: 11/11/1968  . Smokeless tobacco: Never Used  . Alcohol Use: 1.2 oz/week    2 Glasses of wine per week     Comment: Rare  . Drug Use: No  . Sexually Active: Not on file   Other Topics Concern  . Not on file   Social History Narrative   Retired Event organiser.   Active with golf.  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 No known asbestos. 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     Allergies  Allergen Reactions  . Ace Inhibitors     cough  . Actos  (Pioglitazone Hydrochloride)     Held 2012 bladder cancer  . Aspirin     Held 2012 due to hematuria and bruising.   . Bactrim     Rash, presumed allergy  . Sulfa Drugs Cross Reactors     rash     Outpatient Prescriptions Prior to Visit  Medication Sig Dispense Refill  . allopurinol (ZYLOPRIM) 100 MG tablet Take 1 tablet (100 mg total) by mouth daily.  90 tablet  3  . fluocinonide-emollient (LIDEX-E) 0.05 % cream Apply topically 2 (two) times daily.  30 g  3  . gabapentin (NEURONTIN) 300 MG capsule Take 1 capsule (300 mg total) by mouth 2 (two) times daily as needed.  60 capsule  3  . glipiZIDE (GLUCOTROL XL) 10 MG 24 hr tablet Take 1 tablet (10 mg total) by mouth daily.  90 tablet  3  . insulin glargine (LANTUS) 100 UNIT/ML injection Inject 12 Units into the skin daily. Dispense 1 box of 5 pens  5 pen  1  . Insulin Pen Needle (PEN NEEDLES 31GX5/16") 31G X 8 MM MISC Use daily with lantus solostar pen as directed  100 each  1  . torsemide (DEMADEX) 10 MG tablet Take one tablet daily      . hyoscyamine (LEVSIN, ANASPAZ) 0.125 MG tablet Take 1 tablet (0.125 mg total) by mouth every 4 (four) hours as needed for cramping (bladder spasms).  40 tablet  4   No facility-administered medications prior to visit.         Objective:   Physical Exam Filed Vitals:   01/06/13 1530  BP: 132/68  Pulse: 71   Gen: Pleasant, well-nourished, in no distress,  normal affect  ENT: No lesions,  mouth clear,  oropharynx clear, no postnasal drip  Neck: No JVD, no TMG, no carotid bruits  Lungs: No use of accessory muscles, no dullness to percussion, clear without rales or rhonchi  Cardiovascular: RRR, heart sounds normal, no murmur or gallops, trace peripheral edema  Musculoskeletal: No deformities, no cyanosis or clubbing  Neuro: alert, non focal  Skin: Warm, no lesions or rashes   CXR 11/30/12 Comparison: PA and lateral chest 03/01/2011.  Findings: Heart size is upper normal. There is mild  interstitial  prominence. No consolidative process, pneumothorax or effusion.  IMPRESSION:  Subtle interstitial prominence could be due to mild edema.  02/04/05 CT chest  The chest wall, soft tissues, and bony structures demonstrate no significant findings. There are degenerative changes about the spine.  There are small bilateral pleural effusions. There are changes of COPD which are moderate. Breathing motion artifact throughout the exam which limits it somewhat for small pulmonary emboli. The pulmonary arterial tree is fairly well opacified and no definite filling defects are seen to  suggest pulmonary emboli. Heart size is within normal limits. Coronary artery calcifications are noted. Right pleural calcifications are also noted. No pericardial effusion. The esophagus is grossly normal. The aorta is normal in caliber and there is no evidence for dissection. The upper abdomen demonstrates no significant abnormality.  IMPRESSION:  1. No CT evidence for pulmonary emboli. The study is somewhat limited by breathing motion artifact.  2. Coronary artery calcifications.  3. Small bilateral pleural effusions with overlying basilar atelectasis.  4. Right pleural calcifications.  5. COPD changes but no evidence for edema or infiltrate.       Assessment & Plan:  Dyspnea on exertion Suspect contributions from undiagnosed COPD. Consider also anginal equivalent or transient A Fib (beiing evaluated). With pleural plaque on Ct scan 2006 and interstitial changes on CXR recently, must consider an occult asbestos exposure and ILD.  - recheck CXR today to see if there has been clearing post-diuresis - consider repeat CT scan chest - full PFT at rov

## 2013-01-06 NOTE — Telephone Encounter (Signed)
Wife is aware of lab results. New prescription for kayexalate sent in. Pt will stop Losartan. Will monitor blood pressure and call back later this week ( Friday) with blood pressure readings. Horton Chin RN

## 2013-01-06 NOTE — Patient Instructions (Addendum)
Get your cardiac recorder evaluated as planned We will perform CXR today We will perform full pulmonary function testing at your next office visit Follow with Dr Lamonte Sakai next available with full PFT

## 2013-01-07 ENCOUNTER — Telehealth: Payer: Self-pay | Admitting: Cardiology

## 2013-01-08 NOTE — Telephone Encounter (Signed)
New problem   Pt was told to call in with blood pressure readings:Please call pt with any questions 2/26 am 169/78 2/26 pm 132/68 2/27 am 150/68 2/27 pm 140/67 2/28 am 155/76

## 2013-01-08 NOTE — Telephone Encounter (Signed)
Spoke with pt and he is doing well. Blood pressures were noted. Horton Chin RN

## 2013-01-11 NOTE — Progress Notes (Signed)
Quick Note:  Spoke with patient, informed him of results as listed below per RB. Patient verbalized understanding and nothing further needed at this time. ______

## 2013-01-12 NOTE — Telephone Encounter (Signed)
Add amlodipine 2.5 mg every morning.

## 2013-01-13 ENCOUNTER — Telehealth: Payer: Self-pay | Admitting: Cardiology

## 2013-01-13 MED ORDER — AMLODIPINE BESYLATE 2.5 MG PO TABS: 2.5000 mg | ORAL_TABLET | Freq: Every day | ORAL | Status: DC

## 2013-01-13 NOTE — Addendum Note (Signed)
Addended by: Joelyn Oms F on: 01/13/2013 08:40 AM   Modules accepted: Orders

## 2013-01-13 NOTE — Telephone Encounter (Signed)
LMTCB about adding Amlodipine 2.5 mg daily Prescription e-scribed. Horton Chin RN

## 2013-01-13 NOTE — Telephone Encounter (Signed)
Wife is aware of new medication added to control blood pressure. Will pick-up medication today and continue to keep blood pressure log Horton Chin RN

## 2013-01-13 NOTE — Telephone Encounter (Signed)
New problem    Pt was returning your phone call concerning her blood pressure readings.

## 2013-02-03 ENCOUNTER — Ambulatory Visit: Payer: Medicare Other | Admitting: Family Medicine

## 2013-02-08 ENCOUNTER — Other Ambulatory Visit: Payer: Self-pay | Admitting: *Deleted

## 2013-02-08 ENCOUNTER — Ambulatory Visit (INDEPENDENT_AMBULATORY_CARE_PROVIDER_SITE_OTHER): Payer: Medicare Other | Admitting: Cardiology

## 2013-02-08 ENCOUNTER — Encounter: Payer: Self-pay | Admitting: Cardiology

## 2013-02-08 ENCOUNTER — Telehealth: Payer: Self-pay

## 2013-02-08 VITALS — BP 128/58 | HR 86 | Ht 74.0 in | Wt 218.0 lb

## 2013-02-08 DIAGNOSIS — R0600 Dyspnea, unspecified: Secondary | ICD-10-CM

## 2013-02-08 DIAGNOSIS — I2510 Atherosclerotic heart disease of native coronary artery without angina pectoris: Secondary | ICD-10-CM

## 2013-02-08 DIAGNOSIS — I4891 Unspecified atrial fibrillation: Secondary | ICD-10-CM

## 2013-02-08 DIAGNOSIS — R0989 Other specified symptoms and signs involving the circulatory and respiratory systems: Secondary | ICD-10-CM

## 2013-02-08 DIAGNOSIS — R0609 Other forms of dyspnea: Secondary | ICD-10-CM

## 2013-02-08 NOTE — Assessment & Plan Note (Signed)
This is been stable. Catheterization showed nonobstructive disease with normal right-sided pressures and function. We'll see him back in 2 years.

## 2013-02-08 NOTE — Patient Instructions (Addendum)
**Note De-Identified  Obfuscation** Your physician recommends that you continue on your current medications as directed. Please refer to the Current Medication list given to you today.  Your physician wants you to follow-up in: 2 years  You will receive a reminder letter in the mail two months in advance. If you don't receive a letter, please call our office to schedule the follow-up appointment.

## 2013-02-08 NOTE — Progress Notes (Signed)
HPI [name] Small returns today for evaluation and management of his nonobstructive coronary artery disease, normal left ventricular function, and normal right-sided function and pressures. I saw him several years ago which time he is having significant dyspnea on exertion. I was quite surprised with his cath findings.  He has finished aggressive chemotherapy for invasive bladder cancer. He has substantial problems with bruising and bleeding with low-dose aspirin in the past. He would prefer not to take it.  His dyspnea is about the same. He is having no angina.  Past Medical History  Diagnosis Date  . BENIGN PROSTATIC HYPERTROPHY 05/21/2007  . DIABETES MELLITUS, TYPE II 05/21/2007  . DIVERTICULOSIS, COLON 05/21/2007    pt denies  . HYPERTENSION 05/21/2007  . PSORIASIS, SCALP 10/25/2008  . Cellulitis of left foot     Hospitalized in 2006  . Gross hematuria     pt denies  . Psoriasis ELBOWS  . Fatigue AGE-RELATED  . Arthritis   . CAD (coronary artery disease) CARDIOLOGIST- DR Milbern Doescher - VISIT IN JUNE 2012 FOR CATH    nonobstructive by cath 6/12:  mid LAD 30%, proximal obtuse marginal-2 30%, proximal RCA 20%, mid RCA 30-40%.  He had normal cardiac output and mildly elevated filling pressures but no significant pulmonary hypertension;   Echocardiogram in May 2012 demonstrated EF 50-55% and left atrial enlargement   . Impaired hearing BILATERAL HEARING AIDS  . Bladder cancer     2013  . Chronic kidney disease (CKD), stage III (moderate) 12/04/2007    FOLLOWED BY DR PATEL    Current Outpatient Prescriptions  Medication Sig Dispense Refill  . amLODipine (NORVASC) 2.5 MG tablet Take 1 tablet (2.5 mg total) by mouth daily.  180 tablet  3  . fluocinonide-emollient (LIDEX-E) 0.05 % cream Apply topically 2 (two) times daily.  30 g  3  . gabapentin (NEURONTIN) 300 MG capsule Take 1 capsule (300 mg total) by mouth 2 (two) times daily as needed.  60 capsule  3  . glipiZIDE (GLUCOTROL XL) 10 MG 24 hr tablet  Take 1 tablet (10 mg total) by mouth daily.  90 tablet  3  . insulin glargine (LANTUS) 100 UNIT/ML injection Inject 12 Units into the skin daily. Dispense 1 box of 5 pens  5 pen  1  . Insulin Pen Needle (PEN NEEDLES 31GX5/16") 31G X 8 MM MISC Use daily with lantus solostar pen as directed  100 each  1  . torsemide (DEMADEX) 10 MG tablet Take one tablet daily       No current facility-administered medications for this visit.    Allergies  Allergen Reactions  . Ace Inhibitors     cough  . Actos (Pioglitazone Hydrochloride)     Held 2012 bladder cancer  . Aspirin     Held 2012 due to hematuria and bruising.   . Bactrim     Rash, presumed allergy  . Sulfa Drugs Cross Reactors     rash    Family History  Problem Relation Age of Onset  . Diabetes Mother   . Heart disease Brother   . Heart disease Brother   . Heart disease Brother   . Heart disease Brother   . Heart disease Brother   . Heart disease Brother   . Cancer Brother     lung    History   Social History  . Marital Status: Married    Spouse Name: N/A    Number of Children: 4  . Years of Education: N/A  Occupational History  . retired      [employer]	[place].   Social History Main Topics  . Smoking status: Former Smoker -- 2.00 packs/day for 22 years    Types: Cigarettes    Quit date: 11/11/1968  . Smokeless tobacco: Never Used  . Alcohol Use: 1.2 oz/week    2 Glasses of wine per week     Comment: Rare  . Drug Use: No  . Sexually Active: Not on file   Other Topics Concern  . Not on file   Social History Narrative   Retired Event organiser.   Active with golf.    ROS ALL NEGATIVE EXCEPT THOSE NOTED IN HPI  PE  General Appearance: well developed, well nourished in no acute distress, looks young than stated age 79: symmetrical face, PERRLA, good dentition  Neck: no JVD, thyromegaly, or adenopathy, trachea midline Chest: symmetric without deformity Cardiac: PMI non-displaced, RRR, normal S1, S2, no  gallop or murmur Lung: clear to ausculation and percussion Vascular: all pulses full without bruits  Abdominal: nondistended, nontender, good bowel sounds, no HSM, no bruits Extremities: no cyanosis, clubbing or edema, no sign of DVT, no varicosities  Skin: normal color, no rashes Neuro: alert and oriented x 3, non-focal Pysch: normal affect  EKG Not repeated  BMET    Component Value Date/Time   NA 137 01/05/2013 1210   K 5.2* 01/05/2013 1210   CL 99 01/05/2013 1210   CO2 30 01/05/2013 1210   GLUCOSE 229* 01/05/2013 1210   BUN 47* 01/05/2013 1210   CREATININE 2.0* 01/05/2013 1210   CALCIUM 8.2* 01/05/2013 1210   GFRNONAA 28* 12/01/2012 0458   GFRAA 33* 12/01/2012 0458    Lipid Panel     Component Value Date/Time   CHOL 189 11/19/2011 0956   TRIG 86.0 11/19/2011 0956   HDL 43.10 11/19/2011 0956   CHOLHDL 4 11/19/2011 0956   VLDL 17.2 11/19/2011 0956   LDLCALC 129* 11/19/2011 0956    CBC    Component Value Date/Time   WBC 9.6 11/30/2012 1725   RBC 4.47 11/30/2012 1725   HGB 14.6 11/30/2012 1725   HCT 45.2 11/30/2012 1725   PLT 178 11/30/2012 1725   MCV 101.1* 11/30/2012 1725   MCH 32.7 11/30/2012 1725   MCHC 32.3 11/30/2012 1725   RDW 12.9 11/30/2012 1725   LYMPHSABS 1.1 04/27/2009 1012   MONOABS 0.7 04/27/2009 1012   EOSABS 0.3 04/27/2009 1012   BASOSABS 0.0 04/27/2009 1012

## 2013-02-08 NOTE — Telephone Encounter (Signed)
**Note De-Identified  Obfuscation** Pt given results of Event monitor, he verbalized understanding.

## 2013-02-08 NOTE — Assessment & Plan Note (Signed)
He is in normal sinus rhythm today by exam. I do not see where he had additional monitoring. He doesn't even want to take aspirin so will not consider anti-coagulation.

## 2013-02-23 ENCOUNTER — Ambulatory Visit: Payer: Medicare Other | Admitting: Emergency Medicine

## 2013-02-24 ENCOUNTER — Encounter: Payer: Self-pay | Admitting: Family Medicine

## 2013-02-24 ENCOUNTER — Ambulatory Visit (INDEPENDENT_AMBULATORY_CARE_PROVIDER_SITE_OTHER): Payer: Medicare Other | Admitting: Family Medicine

## 2013-02-24 VITALS — BP 128/60 | HR 66 | Temp 98.2°F | Wt 212.0 lb

## 2013-02-24 DIAGNOSIS — R059 Cough, unspecified: Secondary | ICD-10-CM

## 2013-02-24 DIAGNOSIS — R05 Cough: Secondary | ICD-10-CM

## 2013-02-24 NOTE — Progress Notes (Signed)
"  I'm lousy."  Wife was sick first.  He thought he got it from her.  Sx started about 5 days ago. Coughing, spitting up mucous, head feels full.  Wet sounding cough noted.  No fevers. H/o bladder CA. H/o PNA in the past.  He can get a deep breath.  Voice is raspier than normal.  occ wheeze noted last night, better this AM.  Dec in appetite.  He feels better today than a few days ago.   Meds, vitals, and allergies reviewed.   ROS: See HPI.  Otherwise, noncontributory.  GEN: nad, alert and oriented HEENT: mucous membranes moist, tm w/o erythema, nasal exam w/o erythema, clear discharge noted,  OP with cobblestoning, sinuses not ttp, dry skin on pinna x2 noted.  NECK: supple w/o LA CV: rrr.   PULM: ctab, no inc wob, cough is noted but he has UAN and no abnormal sounds originating in the lung fields.

## 2013-02-24 NOTE — Patient Instructions (Addendum)
Keep using the cough syrup and this should gradually improve.  Drink plenty of fluids in the meantime.  Use OTC hydrocortisone at night on your ears (not down in the ear canal) as needed.  Use this episodically.  You can alternate with hand cream if needed.   Take care.

## 2013-02-24 NOTE — Assessment & Plan Note (Signed)
He feels better today and has no fevers.  Likely benign and resolving process, likely viral.  D/w pt.  Lungs ctab, only UAN noted.  Would avoid SABA due to h/o AF.  He is using OTC cough syrup with some relief.  Okay for outpatient f/u.  Can use OTC hydrocortisone episodically on the dry skin on his ears. He agrees.

## 2013-03-08 ENCOUNTER — Other Ambulatory Visit (INDEPENDENT_AMBULATORY_CARE_PROVIDER_SITE_OTHER): Payer: Medicare Other

## 2013-03-08 DIAGNOSIS — E119 Type 2 diabetes mellitus without complications: Secondary | ICD-10-CM

## 2013-03-08 LAB — HEMOGLOBIN A1C: Hgb A1c MFr Bld: 10.3 % — ABNORMAL HIGH (ref 4.6–6.5)

## 2013-03-15 ENCOUNTER — Ambulatory Visit (INDEPENDENT_AMBULATORY_CARE_PROVIDER_SITE_OTHER): Payer: Medicare Other | Admitting: Family Medicine

## 2013-03-15 ENCOUNTER — Encounter: Payer: Self-pay | Admitting: Family Medicine

## 2013-03-15 VITALS — BP 122/58 | HR 68 | Temp 97.5°F | Wt 214.5 lb

## 2013-03-15 DIAGNOSIS — E11319 Type 2 diabetes mellitus with unspecified diabetic retinopathy without macular edema: Secondary | ICD-10-CM

## 2013-03-15 DIAGNOSIS — E1139 Type 2 diabetes mellitus with other diabetic ophthalmic complication: Secondary | ICD-10-CM

## 2013-03-15 NOTE — Progress Notes (Signed)
The cough continues.  He is occ coughing some sputum up.  No fevers.  Sputum is whitish and this is improved from prev.  He isn't SOB.  He is better than he was prev, but it hasn't resolved.  He has been more active recently.  Appetite is improved. His sugars have been elevated since he has been coughing.   Diabetes:  Using medications without difficulties: yes Hypoglycemic episodes: no Hyperglycemic episodes:yes, see above Feet problems: occ dec in sensation in the B feet Blood Sugars averaging: ~110 before the cough started, ~180 since then eye exam within last year: yes A1c up to 10.3.  He didn't have sig dietary indiscretions.    He has f/u with uro in 6/14.    Meds, vitals, and allergies reviewed.   ROS: See HPI.  Otherwise negative.    GEN: nad, alert and oriented HEENT: mucous membranes moist NECK: supple w/o LA CV: IRR, not tachy PULM: ctab, no inc wob ABD: soft, +bs EXT: no edema SKIN: no acute rash  Diabetic foot exam: Normal inspection No skin breakdown No calluses  Normal DP pulses Dec in sensation to light touch and monofilament Nails normal

## 2013-03-15 NOTE — Assessment & Plan Note (Signed)
Sugar elevated with recent illness though that is improving.  Will have pt titrate up his long acting levemir and then recheck in ~3 months.  He agrees.  Labs d/w pt.  Lungs clear today.

## 2013-03-15 NOTE — Patient Instructions (Addendum)
If your AM sugar is above 120, then add a unit of insulin.  If less than 80, take 1 unit less.  If 80-120, then don't change your dose.   Take care.  Recheck in 3 months.

## 2013-04-29 ENCOUNTER — Ambulatory Visit (INDEPENDENT_AMBULATORY_CARE_PROVIDER_SITE_OTHER): Payer: Medicare Other | Admitting: Family Medicine

## 2013-04-29 ENCOUNTER — Encounter: Payer: Self-pay | Admitting: Family Medicine

## 2013-04-29 VITALS — BP 124/60 | HR 60 | Temp 97.5°F | Wt 217.2 lb

## 2013-04-29 DIAGNOSIS — L821 Other seborrheic keratosis: Secondary | ICD-10-CM

## 2013-04-29 NOTE — Patient Instructions (Signed)
Keep the areas clean.  If they don't heal fully, we can recheck them.  They look like benign seborrheic keratosis.   Take care.

## 2013-04-29 NOTE — Progress Notes (Signed)
He had some leg pain, burning and stinging, worse off gabapentin.  We talked about restarting gabapentin and to see if that helps.   Longstanding. 2 lesions on back.  Wife was worried about them.   Meds, vitals, and allergies reviewed.   ROS: See HPI.  Otherwise, noncontributory.  nad Back with 2 typical appearing SKs noted, both flake off during the exam with minimal irritation remaining. No ulceration.

## 2013-04-30 DIAGNOSIS — L821 Other seborrheic keratosis: Secondary | ICD-10-CM | POA: Insufficient documentation

## 2013-04-30 NOTE — Assessment & Plan Note (Signed)
2 typical appearing SKs noted, both flake off during the exam with minimal irritation remaining. No ulceration.  Reassured.  We can recheck if needed. No need to biopsy now.  No charge for exam given the brevity of encounter.

## 2013-05-17 ENCOUNTER — Other Ambulatory Visit: Payer: Self-pay | Admitting: *Deleted

## 2013-05-17 MED ORDER — INSULIN GLARGINE 100 UNIT/ML ~~LOC~~ SOLN: 14.0000 [IU] | Freq: Every day | SUBCUTANEOUS | Status: DC

## 2013-05-20 ENCOUNTER — Other Ambulatory Visit: Payer: Self-pay

## 2013-05-24 ENCOUNTER — Other Ambulatory Visit: Payer: Self-pay | Admitting: *Deleted

## 2013-05-24 MED ORDER — "PEN NEEDLES 5/16"" 31G X 8 MM MISC": Status: DC

## 2013-05-24 NOTE — Telephone Encounter (Signed)
Received faxed refill request from pharmacy. Refill sent to pharmacy electronically. 

## 2013-06-02 ENCOUNTER — Other Ambulatory Visit: Payer: Self-pay | Admitting: Family Medicine

## 2013-06-02 DIAGNOSIS — E119 Type 2 diabetes mellitus without complications: Secondary | ICD-10-CM

## 2013-06-14 ENCOUNTER — Other Ambulatory Visit (INDEPENDENT_AMBULATORY_CARE_PROVIDER_SITE_OTHER): Payer: Medicare Other

## 2013-06-14 DIAGNOSIS — I10 Essential (primary) hypertension: Secondary | ICD-10-CM

## 2013-06-14 DIAGNOSIS — E119 Type 2 diabetes mellitus without complications: Secondary | ICD-10-CM

## 2013-06-14 DIAGNOSIS — E875 Hyperkalemia: Secondary | ICD-10-CM

## 2013-06-14 LAB — COMPREHENSIVE METABOLIC PANEL
ALT: 8 U/L (ref 0–53)
AST: 12 U/L (ref 0–37)
Albumin: 3.7 g/dL (ref 3.5–5.2)
Alkaline Phosphatase: 53 U/L (ref 39–117)
BUN: 33 mg/dL — ABNORMAL HIGH (ref 6–23)
CO2: 33 mEq/L — ABNORMAL HIGH (ref 19–32)
Calcium: 8.6 mg/dL (ref 8.4–10.5)
Chloride: 102 mEq/L (ref 96–112)
Creatinine, Ser: 1.8 mg/dL — ABNORMAL HIGH (ref 0.4–1.5)
GFR: 37.56 mL/min — ABNORMAL LOW (ref >60.00)
Glucose, Bld: 126 mg/dL — ABNORMAL HIGH (ref 70–99)
Potassium: 4.7 mEq/L (ref 3.5–5.1)
Sodium: 141 mEq/L (ref 135–145)
Total Bilirubin: 0.8 mg/dL (ref 0.3–1.2)
Total Protein: 7 g/dL (ref 6.0–8.3)

## 2013-06-14 LAB — LIPID PANEL
Cholesterol: 166 mg/dL (ref 0–200)
HDL: 38.8 mg/dL — ABNORMAL LOW (ref >39.00)
LDL Cholesterol: 100 mg/dL — ABNORMAL HIGH (ref 0–99)
Total CHOL/HDL Ratio: 4
Triglycerides: 138 mg/dL (ref 0.0–149.0)
VLDL: 27.6 mg/dL (ref 0.0–40.0)

## 2013-06-14 LAB — HEMOGLOBIN A1C: Hgb A1c MFr Bld: 9.4 % — ABNORMAL HIGH (ref 4.6–6.5)

## 2013-06-17 ENCOUNTER — Ambulatory Visit (INDEPENDENT_AMBULATORY_CARE_PROVIDER_SITE_OTHER): Payer: Medicare Other | Admitting: Family Medicine

## 2013-06-17 ENCOUNTER — Encounter: Payer: Self-pay | Admitting: Family Medicine

## 2013-06-17 VITALS — BP 128/68 | HR 63 | Temp 97.8°F | Wt 217.8 lb

## 2013-06-17 DIAGNOSIS — N183 Chronic kidney disease, stage 3 unspecified: Secondary | ICD-10-CM

## 2013-06-17 DIAGNOSIS — M25579 Pain in unspecified ankle and joints of unspecified foot: Secondary | ICD-10-CM

## 2013-06-17 DIAGNOSIS — E119 Type 2 diabetes mellitus without complications: Secondary | ICD-10-CM

## 2013-06-17 DIAGNOSIS — R252 Cramp and spasm: Secondary | ICD-10-CM

## 2013-06-17 MED ORDER — INSULIN GLARGINE 100 UNIT/ML ~~LOC~~ SOLN: 16.0000 [IU] | Freq: Every day | SUBCUTANEOUS | Status: DC

## 2013-06-17 NOTE — Progress Notes (Signed)
Diabetes:  Using medications without difficulties:yes, but occ missed dose of insulin Hypoglycemic episodes:no Hyperglycemic episodes:occ Feet problems:still with some foot pain.  No help with shoe changes.  B plantar pain. Gabapentin didn't help.  Blood Sugars averaging: 130-140s, occ lower eye exam within last year: yes  Leg cramps improved, see the above re: foot pain.    He has had f/u with uro and will have f/u again this fall.    CKD.  He is due for f/u with renal.  Discussed f/u and DM2/BP control.   PMH and SH reviewed  Meds, vitals, and allergies reviewed.   ROS: See HPI.  Otherwise negative.    GEN: nad, alert and oriented HEENT: mucous membranes moist NECK: supple w/o LA CV: rrr. PULM: ctab, no inc wob ABD: soft, +bs EXT: no edema SKIN: no acute rash  Diabetic foot exam: Normal inspection No skin breakdown No calluses  Normal DP pulses Normal sensation to light touch and monofilament Nails normal

## 2013-06-17 NOTE — Patient Instructions (Addendum)
See Rosaria Ferries about your referral before you leave today. Call about seeing Dr. Posey Pronto.   Fill out the grid with your sugars and we'll go from there.  Take care.

## 2013-06-18 NOTE — Assessment & Plan Note (Signed)
A1c improved, not controlled. Needs pre/post sugar grid filled out and we'll go from there.  Encouraged not to miss lantus shots. He agrees.

## 2013-06-18 NOTE — Assessment & Plan Note (Signed)
This is improved but the foot pain continues.  Gabapentin didn't help.  Would like ortho input at this point.  I didn't start lyrica given his CKD.

## 2013-06-18 NOTE — Assessment & Plan Note (Signed)
He'll f/u with renal in the interval.

## 2013-07-27 ENCOUNTER — Ambulatory Visit (INDEPENDENT_AMBULATORY_CARE_PROVIDER_SITE_OTHER): Payer: Medicare Other | Admitting: Family Medicine

## 2013-07-27 ENCOUNTER — Encounter: Payer: Self-pay | Admitting: Family Medicine

## 2013-07-27 VITALS — BP 122/64 | HR 88 | Temp 97.7°F | Wt 217.2 lb

## 2013-07-27 DIAGNOSIS — N183 Chronic kidney disease, stage 3 unspecified: Secondary | ICD-10-CM

## 2013-07-27 DIAGNOSIS — E1139 Type 2 diabetes mellitus with other diabetic ophthalmic complication: Secondary | ICD-10-CM

## 2013-07-27 DIAGNOSIS — E11319 Type 2 diabetes mellitus with unspecified diabetic retinopathy without macular edema: Secondary | ICD-10-CM

## 2013-07-27 DIAGNOSIS — I10 Essential (primary) hypertension: Secondary | ICD-10-CM

## 2013-07-27 DIAGNOSIS — Z23 Encounter for immunization: Secondary | ICD-10-CM

## 2013-07-27 LAB — BASIC METABOLIC PANEL
BUN: 32 mg/dL — ABNORMAL HIGH (ref 6–23)
CO2: 33 mEq/L — ABNORMAL HIGH (ref 19–32)
Calcium: 8.6 mg/dL (ref 8.4–10.5)
Chloride: 99 mEq/L (ref 96–112)
Creatinine, Ser: 1.9 mg/dL — ABNORMAL HIGH (ref 0.4–1.5)
GFR: 35.54 mL/min — ABNORMAL LOW (ref >60.00)
Glucose, Bld: 178 mg/dL — ABNORMAL HIGH (ref 70–99)
Potassium: 4.8 mEq/L (ref 3.5–5.1)
Sodium: 138 mEq/L (ref 135–145)

## 2013-07-27 NOTE — Patient Instructions (Signed)
Keep your legs elevated.   You can try compression stockings.  Go to the lab on the way out.  We'll contact you with your lab report. If your sugar gets higher, then let me know.  Take care.

## 2013-07-27 NOTE — Assessment & Plan Note (Signed)
His sugar is 76-120s since he change to BID lantus.  This is much improved.  D/w pt about diet and prev labs, sugar readings.  We were going to consider mealtime insulin but his sugars are reported much improved.  Will await f/u A1c.

## 2013-07-27 NOTE — Progress Notes (Signed)
He had BLE pain and that led to a fall. Was seen by ortho, Raliegh Ip, awaiting results.  Pain is some better now.    He had some more BLE edema recently.  Worse as the day goes on.  Resolved by AM. No CP, not SOB.  Not unilateral, but noted more on the L>R leg/foot.   DM2.  His sugar is 76-120s since he change to BID lantus.  This is much improved.  D/w pt about diet and prev labs, sugar readings.  We were going to consider mealtime insulin but his sugars are reported much improved.   Meds, vitals, and allergies reviewed.   ROS: See HPI.  Otherwise, noncontributory.  nad ncat Mmm Sounds to be rrr ctab abd soft Ext with trace-1+ BLE edema. Intact DP pulse B

## 2013-07-27 NOTE — Assessment & Plan Note (Signed)
Recheck Cr today given the edema.  He may benefit from compression stockings in the meantime.

## 2013-07-30 ENCOUNTER — Encounter: Payer: Self-pay | Admitting: *Deleted

## 2013-09-09 ENCOUNTER — Other Ambulatory Visit (INDEPENDENT_AMBULATORY_CARE_PROVIDER_SITE_OTHER): Payer: Medicare Other

## 2013-09-09 DIAGNOSIS — E119 Type 2 diabetes mellitus without complications: Secondary | ICD-10-CM

## 2013-09-09 LAB — HEMOGLOBIN A1C: Hgb A1c MFr Bld: 9.4 % — ABNORMAL HIGH (ref 4.6–6.5)

## 2013-09-10 ENCOUNTER — Other Ambulatory Visit: Payer: Medicare Other

## 2013-09-14 ENCOUNTER — Ambulatory Visit: Payer: Medicare Other | Admitting: Family Medicine

## 2013-09-21 ENCOUNTER — Encounter: Payer: Self-pay | Admitting: Family Medicine

## 2013-09-21 ENCOUNTER — Ambulatory Visit: Payer: Medicare Other | Admitting: Family Medicine

## 2013-09-21 ENCOUNTER — Ambulatory Visit (INDEPENDENT_AMBULATORY_CARE_PROVIDER_SITE_OTHER): Payer: Medicare Other | Admitting: Family Medicine

## 2013-09-21 VITALS — BP 110/84 | HR 76 | Temp 97.5°F | Ht 74.0 in | Wt 218.8 lb

## 2013-09-21 DIAGNOSIS — E1142 Type 2 diabetes mellitus with diabetic polyneuropathy: Secondary | ICD-10-CM

## 2013-09-21 DIAGNOSIS — E1149 Type 2 diabetes mellitus with other diabetic neurological complication: Secondary | ICD-10-CM

## 2013-09-21 DIAGNOSIS — E119 Type 2 diabetes mellitus without complications: Secondary | ICD-10-CM

## 2013-09-21 MED ORDER — INSULIN GLARGINE 100 UNIT/ML ~~LOC~~ SOLN: 16.0000 [IU] | Freq: Every day | SUBCUTANEOUS | Status: DC

## 2013-09-21 NOTE — Patient Instructions (Addendum)
If AM sugar is 100-130, no change in lantus.  If 99 or lower, decrease by 1 unit. If 131 or higher then add 1 unit.   Check on getting an eye appointment.   Recheck in late Feb or early March 2015, sooner if needed.  Nonfasting labs ahead of time.  Take care.

## 2013-09-22 ENCOUNTER — Encounter: Payer: Self-pay | Admitting: Family Medicine

## 2013-09-22 NOTE — Progress Notes (Signed)
DM2 f/u.  Sugars were improved to ~100 in AM before back injection, steroid injection. Up to ~160 in Am after that, now back down to ~130.  Still with occ carbs in diet, discussed.  More compliant with insulin now.  occ missed dose.  Weight stable.  A1c still up at 9.4 but not worse.    BP has been controlled, no edema.    Back pain improved f/u shot pending. F/u with uro re: bladder cancer pending.   Meds, vitals, and allergies reviewed.   ROS: See HPI.  Otherwise, noncontributory.  nad ncat Mmm rrr ctab abd soft, not ttp Ext w/o edema  Diabetic foot exam: Normal inspection No skin breakdown No calluses  Normal DP pulses Slight dec in sensation to light touch and monofilament Nails normal

## 2013-09-22 NOTE — Assessment & Plan Note (Signed)
Continue lantus, titrate dose based on AM sugar reading.  He agrees. This will allow some better control with the upcoming back injection.  D/w pt about cutting out french fries and he understood.  Recheck in a few months, sooner if needed. We can consider mealtime insulin if needed after his back injections are settled.  >25 min spent with face to face with patient, >50% counseling and/or coordinating care.

## 2013-09-27 ENCOUNTER — Other Ambulatory Visit: Payer: Self-pay | Admitting: Family Medicine

## 2013-09-27 NOTE — Telephone Encounter (Signed)
Electronic refill request.  Please advise. 

## 2013-09-27 NOTE — Telephone Encounter (Signed)
Sent!

## 2013-11-16 ENCOUNTER — Other Ambulatory Visit: Payer: Self-pay

## 2013-11-16 MED ORDER — ALLOPURINOL 100 MG PO TABS: 100.0000 mg | ORAL_TABLET | Freq: Every day | ORAL | Status: DC

## 2013-11-16 NOTE — Telephone Encounter (Signed)
pts wife request refill allopurinol to [facility] [place]. Advised done.

## 2013-12-20 ENCOUNTER — Other Ambulatory Visit (INDEPENDENT_AMBULATORY_CARE_PROVIDER_SITE_OTHER): Payer: Medicare Other

## 2013-12-20 DIAGNOSIS — E119 Type 2 diabetes mellitus without complications: Secondary | ICD-10-CM

## 2013-12-20 LAB — BASIC METABOLIC PANEL
BUN: 50 mg/dL — ABNORMAL HIGH (ref 6–23)
CO2: 32 mEq/L (ref 19–32)
Calcium: 8 mg/dL — ABNORMAL LOW (ref 8.4–10.5)
Chloride: 93 mEq/L — ABNORMAL LOW (ref 96–112)
Creatinine, Ser: 2.6 mg/dL — ABNORMAL HIGH (ref 0.4–1.5)
GFR: 25.28 mL/min — ABNORMAL LOW (ref >60.00)
Glucose, Bld: 119 mg/dL — ABNORMAL HIGH (ref 70–99)
Potassium: 4.7 mEq/L (ref 3.5–5.1)
Sodium: 137 mEq/L (ref 135–145)

## 2013-12-20 LAB — HEMOGLOBIN A1C: Hgb A1c MFr Bld: 9.9 % — ABNORMAL HIGH (ref 4.6–6.5)

## 2013-12-21 ENCOUNTER — Other Ambulatory Visit: Payer: Self-pay | Admitting: Family Medicine

## 2013-12-21 ENCOUNTER — Other Ambulatory Visit: Payer: Medicare Other

## 2013-12-21 DIAGNOSIS — N183 Chronic kidney disease, stage 3 unspecified: Secondary | ICD-10-CM

## 2013-12-22 ENCOUNTER — Other Ambulatory Visit (INDEPENDENT_AMBULATORY_CARE_PROVIDER_SITE_OTHER): Payer: Medicare Other

## 2013-12-22 DIAGNOSIS — N183 Chronic kidney disease, stage 3 unspecified: Secondary | ICD-10-CM

## 2013-12-22 LAB — BASIC METABOLIC PANEL
BUN: 45 mg/dL — ABNORMAL HIGH (ref 6–23)
CO2: 36 mEq/L — ABNORMAL HIGH (ref 19–32)
Calcium: 8.7 mg/dL (ref 8.4–10.5)
Chloride: 96 mEq/L (ref 96–112)
Creatinine, Ser: 2.3 mg/dL — ABNORMAL HIGH (ref 0.4–1.5)
GFR: 29.14 mL/min — ABNORMAL LOW (ref >60.00)
Glucose, Bld: 76 mg/dL (ref 70–99)
Potassium: 4.4 mEq/L (ref 3.5–5.1)
Sodium: 141 mEq/L (ref 135–145)

## 2013-12-28 ENCOUNTER — Ambulatory Visit: Payer: Medicare Other | Admitting: Family Medicine

## 2014-01-04 ENCOUNTER — Ambulatory Visit: Payer: Medicare Other | Admitting: Family Medicine

## 2014-01-14 ENCOUNTER — Ambulatory Visit (INDEPENDENT_AMBULATORY_CARE_PROVIDER_SITE_OTHER): Payer: Medicare Other | Admitting: Family Medicine

## 2014-01-14 ENCOUNTER — Encounter: Payer: Self-pay | Admitting: Family Medicine

## 2014-01-14 VITALS — BP 126/68 | HR 65 | Temp 97.4°F | Wt 218.5 lb

## 2014-01-14 DIAGNOSIS — N183 Chronic kidney disease, stage 3 unspecified: Secondary | ICD-10-CM

## 2014-01-14 DIAGNOSIS — E1149 Type 2 diabetes mellitus with other diabetic neurological complication: Secondary | ICD-10-CM

## 2014-01-14 DIAGNOSIS — I509 Heart failure, unspecified: Secondary | ICD-10-CM

## 2014-01-14 LAB — BASIC METABOLIC PANEL
BUN: 42 mg/dL — ABNORMAL HIGH (ref 6–23)
CO2: 33 mEq/L — ABNORMAL HIGH (ref 19–32)
Calcium: 9.3 mg/dL (ref 8.4–10.5)
Chloride: 96 mEq/L (ref 96–112)
Creatinine, Ser: 2.3 mg/dL — ABNORMAL HIGH (ref 0.4–1.5)
GFR: 29.29 mL/min — ABNORMAL LOW (ref >60.00)
Glucose, Bld: 103 mg/dL — ABNORMAL HIGH (ref 70–99)
Potassium: 4.6 mEq/L (ref 3.5–5.1)
Sodium: 140 mEq/L (ref 135–145)

## 2014-01-14 NOTE — Progress Notes (Signed)
Pre visit review using our clinic review tool, if applicable. No additional management support is needed unless otherwise documented below in the visit note.  DM2. Sugar is much improved since his [place] visit.  That was mid January.  A1c was up, likely lags his progress.  D/w pt.  Recently ~100 in the AMs.   Working on a low carb diet.   CKD. Weight is stable. Med list update.  Off demadex. On lasix MWF.  Dec in edema noted.  BP okay now. Discussed getting set back up with renal.  He'll call about that.  Outside labs reviewed, BMET repeated today.  He feels well.   Meds, vitals, and allergies reviewed.   ROS: See HPI.  Otherwise, noncontributory.  nad ncat mmm Neck supple, no LA Sounds to be rrr ctab abd soft, not ttp No edema in BLE

## 2014-01-14 NOTE — Patient Instructions (Signed)
Go to the lab on the way out.  We'll contact you with your lab report. I'll await the notes from your eye exam.  Call about seeing Dr. Posey Pronto.  Recheck in about 3 months, labs ahead of time.  If your weight is changing by more then 2 lbs, then let me know.  Take care.

## 2014-01-16 NOTE — Assessment & Plan Note (Signed)
I hope that his A1c lags his progress.  I believe him that his sugars are improved, so we'l recheck A1c in about 3 months.  He agrees.  See notes on labs.

## 2014-01-16 NOTE — Assessment & Plan Note (Signed)
D/w pt.  See notes on labs.  Lasix MWF with no edema noted.  He'll call about f/u with renal.

## 2014-01-16 NOTE — Assessment & Plan Note (Signed)
His weight has been stable recently, no CP, not SOB.  Feels well.  Appears euvolemic.  Continue current lasix dose.  D/w pt about cardiorenal syndrome and need to monitor weight, fluids status, salt intake.  He understood.

## 2014-01-17 ENCOUNTER — Emergency Department (HOSPITAL_COMMUNITY): Payer: Medicare Other

## 2014-01-17 ENCOUNTER — Encounter (HOSPITAL_COMMUNITY): Payer: Self-pay | Admitting: Emergency Medicine

## 2014-01-17 ENCOUNTER — Inpatient Hospital Stay (HOSPITAL_COMMUNITY)
Admission: EM | Admit: 2014-01-17 | Discharge: 2014-01-21 | DRG: 871 | Disposition: A | Discharge status: Home / Self Care | Payer: Medicare Other | Attending: Internal Medicine | Admitting: Internal Medicine

## 2014-01-17 DIAGNOSIS — E1142 Type 2 diabetes mellitus with diabetic polyneuropathy: Secondary | ICD-10-CM | POA: Diagnosis present

## 2014-01-17 DIAGNOSIS — I509 Heart failure, unspecified: Secondary | ICD-10-CM | POA: Diagnosis present

## 2014-01-17 DIAGNOSIS — E440 Moderate protein-calorie malnutrition: Secondary | ICD-10-CM | POA: Diagnosis present

## 2014-01-17 DIAGNOSIS — Z9852 Vasectomy status: Secondary | ICD-10-CM

## 2014-01-17 DIAGNOSIS — I959 Hypotension, unspecified: Secondary | ICD-10-CM | POA: Diagnosis present

## 2014-01-17 DIAGNOSIS — Z87891 Personal history of nicotine dependence: Secondary | ICD-10-CM

## 2014-01-17 DIAGNOSIS — J9600 Acute respiratory failure, unspecified whether with hypoxia or hypercapnia: Secondary | ICD-10-CM | POA: Diagnosis present

## 2014-01-17 DIAGNOSIS — E119 Type 2 diabetes mellitus without complications: Secondary | ICD-10-CM | POA: Diagnosis present

## 2014-01-17 DIAGNOSIS — I4891 Unspecified atrial fibrillation: Secondary | ICD-10-CM | POA: Diagnosis present

## 2014-01-17 DIAGNOSIS — Z8551 Personal history of malignant neoplasm of bladder: Secondary | ICD-10-CM

## 2014-01-17 DIAGNOSIS — R031 Nonspecific low blood-pressure reading: Secondary | ICD-10-CM | POA: Diagnosis present

## 2014-01-17 DIAGNOSIS — R0600 Dyspnea, unspecified: Secondary | ICD-10-CM

## 2014-01-17 DIAGNOSIS — Z881 Allergy status to other antibiotic agents status: Secondary | ICD-10-CM

## 2014-01-17 DIAGNOSIS — N183 Chronic kidney disease, stage 3 unspecified: Secondary | ICD-10-CM

## 2014-01-17 DIAGNOSIS — N400 Enlarged prostate without lower urinary tract symptoms: Secondary | ICD-10-CM | POA: Diagnosis present

## 2014-01-17 DIAGNOSIS — J101 Influenza due to other identified influenza virus with other respiratory manifestations: Secondary | ICD-10-CM | POA: Diagnosis present

## 2014-01-17 DIAGNOSIS — I5033 Acute on chronic diastolic (congestive) heart failure: Secondary | ICD-10-CM | POA: Diagnosis present

## 2014-01-17 DIAGNOSIS — R252 Cramp and spasm: Secondary | ICD-10-CM

## 2014-01-17 DIAGNOSIS — R0609 Other forms of dyspnea: Secondary | ICD-10-CM

## 2014-01-17 DIAGNOSIS — Z886 Allergy status to analgesic agent status: Secondary | ICD-10-CM

## 2014-01-17 DIAGNOSIS — Z79899 Other long term (current) drug therapy: Secondary | ICD-10-CM

## 2014-01-17 DIAGNOSIS — J069 Acute upper respiratory infection, unspecified: Secondary | ICD-10-CM

## 2014-01-17 DIAGNOSIS — E875 Hyperkalemia: Secondary | ICD-10-CM

## 2014-01-17 DIAGNOSIS — Z961 Presence of intraocular lens: Secondary | ICD-10-CM

## 2014-01-17 DIAGNOSIS — Z833 Family history of diabetes mellitus: Secondary | ICD-10-CM

## 2014-01-17 DIAGNOSIS — K5730 Diverticulosis of large intestine without perforation or abscess without bleeding: Secondary | ICD-10-CM | POA: Diagnosis present

## 2014-01-17 DIAGNOSIS — I498 Other specified cardiac arrhythmias: Secondary | ICD-10-CM | POA: Diagnosis present

## 2014-01-17 DIAGNOSIS — I129 Hypertensive chronic kidney disease with stage 1 through stage 4 chronic kidney disease, or unspecified chronic kidney disease: Secondary | ICD-10-CM | POA: Diagnosis present

## 2014-01-17 DIAGNOSIS — H9190 Unspecified hearing loss, unspecified ear: Secondary | ICD-10-CM | POA: Diagnosis present

## 2014-01-17 DIAGNOSIS — I10 Essential (primary) hypertension: Secondary | ICD-10-CM

## 2014-01-17 DIAGNOSIS — E1169 Type 2 diabetes mellitus with other specified complication: Secondary | ICD-10-CM | POA: Diagnosis not present

## 2014-01-17 DIAGNOSIS — I5032 Chronic diastolic (congestive) heart failure: Secondary | ICD-10-CM | POA: Diagnosis present

## 2014-01-17 DIAGNOSIS — C679 Malignant neoplasm of bladder, unspecified: Secondary | ICD-10-CM

## 2014-01-17 DIAGNOSIS — I2510 Atherosclerotic heart disease of native coronary artery without angina pectoris: Secondary | ICD-10-CM | POA: Diagnosis present

## 2014-01-17 DIAGNOSIS — R05 Cough: Secondary | ICD-10-CM

## 2014-01-17 DIAGNOSIS — Z8249 Family history of ischemic heart disease and other diseases of the circulatory system: Secondary | ICD-10-CM

## 2014-01-17 DIAGNOSIS — Z66 Do not resuscitate: Secondary | ICD-10-CM | POA: Diagnosis present

## 2014-01-17 DIAGNOSIS — L408 Other psoriasis: Secondary | ICD-10-CM

## 2014-01-17 DIAGNOSIS — R6510 Systemic inflammatory response syndrome (SIRS) of non-infectious origin without acute organ dysfunction: Secondary | ICD-10-CM

## 2014-01-17 DIAGNOSIS — Z794 Long term (current) use of insulin: Secondary | ICD-10-CM

## 2014-01-17 DIAGNOSIS — Z9849 Cataract extraction status, unspecified eye: Secondary | ICD-10-CM

## 2014-01-17 DIAGNOSIS — L821 Other seborrheic keratosis: Secondary | ICD-10-CM

## 2014-01-17 DIAGNOSIS — E1149 Type 2 diabetes mellitus with other diabetic neurological complication: Secondary | ICD-10-CM

## 2014-01-17 DIAGNOSIS — R062 Wheezing: Secondary | ICD-10-CM

## 2014-01-17 DIAGNOSIS — Z888 Allergy status to other drugs, medicaments and biological substances status: Secondary | ICD-10-CM

## 2014-01-17 DIAGNOSIS — A419 Sepsis, unspecified organism: Principal | ICD-10-CM | POA: Diagnosis present

## 2014-01-17 DIAGNOSIS — Z882 Allergy status to sulfonamides status: Secondary | ICD-10-CM

## 2014-01-17 DIAGNOSIS — R059 Cough, unspecified: Secondary | ICD-10-CM

## 2014-01-17 DIAGNOSIS — J441 Chronic obstructive pulmonary disease with (acute) exacerbation: Secondary | ICD-10-CM

## 2014-01-17 DIAGNOSIS — J09X2 Influenza due to identified novel influenza A virus with other respiratory manifestations: Secondary | ICD-10-CM | POA: Diagnosis present

## 2014-01-17 DIAGNOSIS — Z9089 Acquired absence of other organs: Secondary | ICD-10-CM

## 2014-01-17 LAB — CBC WITH DIFFERENTIAL/PLATELET
Basophils Absolute: 0.1 10*3/uL (ref 0.0–0.1)
Basophils Relative: 1 % (ref 0–1)
Eosinophils Absolute: 0.1 10*3/uL (ref 0.0–0.7)
Eosinophils Relative: 2 % (ref 0–5)
HCT: 45.8 % (ref 39.0–52.0)
Hemoglobin: 15.2 g/dL (ref 13.0–17.0)
Lymphocytes Relative: 11 % — ABNORMAL LOW (ref 12–46)
Lymphs Abs: 0.7 10*3/uL (ref 0.7–4.0)
MCH: 33.2 pg (ref 26.0–34.0)
MCHC: 33.2 g/dL (ref 30.0–36.0)
MCV: 100 fL (ref 78.0–100.0)
Monocytes Absolute: 1 10*3/uL (ref 0.1–1.0)
Monocytes Relative: 15 % — ABNORMAL HIGH (ref 3–12)
Neutro Abs: 4.7 10*3/uL (ref 1.7–7.7)
Neutrophils Relative %: 72 % (ref 43–77)
Platelets: 163 10*3/uL (ref 150–400)
RBC: 4.58 MIL/uL (ref 4.22–5.81)
RDW: 12.8 % (ref 11.5–15.5)
WBC: 6.6 10*3/uL (ref 4.0–10.5)

## 2014-01-17 LAB — COMPREHENSIVE METABOLIC PANEL
ALT: 8 U/L (ref 0–53)
AST: 16 U/L (ref 0–37)
Albumin: 3.6 g/dL (ref 3.5–5.2)
Alkaline Phosphatase: 63 U/L (ref 39–117)
BUN: 32 mg/dL — ABNORMAL HIGH (ref 6–23)
CO2: 31 mEq/L (ref 19–32)
Calcium: 8.5 mg/dL (ref 8.4–10.5)
Chloride: 98 mEq/L (ref 96–112)
Creatinine, Ser: 2.08 mg/dL — ABNORMAL HIGH (ref 0.50–1.35)
GFR calc Af Amer: 32 mL/min — ABNORMAL LOW (ref >90)
GFR calc non Af Amer: 28 mL/min — ABNORMAL LOW (ref >90)
Glucose, Bld: 86 mg/dL (ref 70–99)
Potassium: 4.8 mEq/L (ref 3.7–5.3)
Sodium: 141 mEq/L (ref 137–147)
Total Bilirubin: 0.3 mg/dL (ref 0.3–1.2)
Total Protein: 7.1 g/dL (ref 6.0–8.3)

## 2014-01-17 LAB — I-STAT TROPONIN, ED
Troponin i, poc: 0.02 ng/mL (ref 0.00–0.08)
Troponin i, poc: 0 ng/mL (ref 0.00–0.08)

## 2014-01-17 LAB — PRO B NATRIURETIC PEPTIDE: Pro B Natriuretic peptide (BNP): 267.3 pg/mL (ref 0–450)

## 2014-01-17 LAB — CBG MONITORING, ED: Glucose-Capillary: 84 mg/dL (ref 70–99)

## 2014-01-17 MED ORDER — SODIUM CHLORIDE 0.9 % IV SOLN: 1000.0000 mL | Freq: Once | INTRAVENOUS | Status: DC

## 2014-01-17 MED ORDER — LABETALOL HCL 5 MG/ML IV SOLN
10.0000 mg | Freq: Once | INTRAVENOUS | Status: DC
  Filled 2014-01-17: qty 4

## 2014-01-17 MED ORDER — SODIUM CHLORIDE 0.9 % IV BOLUS (SEPSIS)
1000.0000 mL | Freq: Once | INTRAVENOUS | Status: AC
  Administered 2014-01-17: 1000 mL via INTRAVENOUS

## 2014-01-17 MED ORDER — IPRATROPIUM BROMIDE 0.02 % IN SOLN
0.5000 mg | Freq: Once | RESPIRATORY_TRACT | Status: AC
  Administered 2014-01-17: 0.5 mg via RESPIRATORY_TRACT
  Filled 2014-01-17: qty 2.5

## 2014-01-17 MED ORDER — ALBUTEROL (5 MG/ML) CONTINUOUS INHALATION SOLN
10.0000 mg/h | INHALATION_SOLUTION | Freq: Once | RESPIRATORY_TRACT | Status: AC
  Administered 2014-01-17: 10 mg/h via RESPIRATORY_TRACT
  Filled 2014-01-17: qty 20

## 2014-01-17 MED ORDER — SODIUM CHLORIDE 0.9 % IV SOLN
Freq: Once | INTRAVENOUS | Status: AC
  Administered 2014-01-17: 23:00:00 via INTRAVENOUS

## 2014-01-17 MED ORDER — SODIUM CHLORIDE 0.9 % IV SOLN: 1000.0000 mL | INTRAVENOUS | Status: DC

## 2014-01-17 MED ORDER — PREDNISONE 20 MG PO TABS
60.0000 mg | ORAL_TABLET | Freq: Once | ORAL | Status: AC
  Administered 2014-01-18: 60 mg via ORAL
  Filled 2014-01-17: qty 3

## 2014-01-17 NOTE — ED Provider Notes (Signed)
CSN: [id number]     Arrival date & time 01/17/14  1940 History   First MD Initiated Contact with Patient 01/17/14 2000     Chief Complaint  Patient presents with  . Generalized Body Aches  . Shortness of Breath       Patient is a 80 y.o. male presenting with shortness of breath.  Shortness of Breath Severity:  Mild Onset quality:  Gradual Duration:  1 day Timing:  Constant Progression:  Worsening Chronicity:  New Context: URI   Context: not activity, not animal exposure, not emotional upset and not fumes   Relieved by:  None tried Worsened by:  Nothing tried Ineffective treatments:  None tried Associated symptoms: cough, sore throat, sputum production and wheezing   Associated symptoms: no abdominal pain, no chest pain, no claudication, no diaphoresis, no ear pain, no fever, no headaches, no hemoptysis, no neck pain, no PND, no rash, no syncope, no swollen glands and no vomiting   Risk factors: hx of cancer ( bladder)   Risk factors: no recent alcohol use      Past Medical History  Diagnosis Date  . BENIGN PROSTATIC HYPERTROPHY 05/21/2007  . DIABETES MELLITUS, TYPE II 05/21/2007  . DIVERTICULOSIS, COLON 05/21/2007    pt denies  . HYPERTENSION 05/21/2007  . PSORIASIS, SCALP 10/25/2008  . Cellulitis of left foot     Hospitalized in 2006  . Gross hematuria     pt denies  . Psoriasis ELBOWS  . Fatigue AGE-RELATED  . Arthritis   . CAD (coronary artery disease) CARDIOLOGIST- DR WALL - VISIT IN JUNE 2012 FOR CATH    nonobstructive by cath 6/12:  mid LAD 30%, proximal obtuse marginal-2 30%, proximal RCA 20%, mid RCA 30-40%.  He had normal cardiac output and mildly elevated filling pressures but no significant pulmonary hypertension;   Echocardiogram in May 2012 demonstrated EF 50-55% and left atrial enlargement   . Impaired hearing BILATERAL HEARING AIDS  . Bladder cancer     2013  . Chronic kidney disease (CKD), stage III (moderate) 12/04/2007    FOLLOWED BY DR PATEL  . Back  pain     s/p lumbar injection 2014   Past Surgical History  Procedure Laterality Date  . Vasectomy  15 YRS AGO  W/ ANES.  Marland Kitchen Penile prosthesis implant  15 YRS AGO  . Cardiovascular stress test  2007  . Cardiac catheterization  04-24-11/  DR MUHAMMAD ARIDA    MILD NONOBSTRUCTIVE CAD, NORMA CARDIAC OUTPUT  . Appendectomy  AGE 71  . Tonsillectomy and adenoidectomy  AGE 78 YRS AGO  . Cataract extraction w/ intraocular lens  implant, bilateral    . Incision and drainage foot  LEFT FOOT DUE TO INFECTION FROM  NAIL PUNCTURE INJURY  . Cystoscopy w/ retrogrades  10/30/2011    Procedure: CYSTOSCOPY WITH RETROGRADE PYELOGRAM;  Surgeon: Molli Hazard, MD;  Location: [facility];  Service: Urology;  Laterality: Bilateral;  CYSTOSCOPY POSS TURBT BILATERAL RETROGRADE PYLEOGRAM POSS BILATERAL URETEROSCOPY WITH BIOPSY LASER ABLATION OF LESION POSS BILATERAL URETERAL STENT PLACEMENT   CARM FLEXIBLE URETERAL SCOPE DIGITAL URETERAL SCOPE PK GYRUS HO  . Transurethral resection of bladder tumor  10/30/2011    Procedure: TRANSURETHRAL RESECTION OF BLADDER TUMOR (TURBT);  Surgeon: Molli Hazard, MD;  Location: [facility];  Service: Urology;  Laterality: N/A;  . Cystoscopy  12/25/2011    Procedure: CYSTOSCOPY;  Surgeon: Molli Hazard, MD;  Location: [facility];  Service: Urology;  Laterality: N/A;  needs intubation and to be paralyzed   . Transurethral resection of bladder tumor  12/25/2011    Procedure: TRANSURETHRAL RESECTION OF BLADDER TUMOR (TURBT);  Surgeon: Molli Hazard, MD;  Location: [facility];  Service: Urology;  Laterality: N/A;  need long gyrus instruments Dr states he has spoken w/ Nelwyn Salisbury regarding this    . Cystoscopy w/ retrogrades  11/30/2012    Procedure: CYSTOSCOPY WITH RETROGRADE PYELOGRAM;  Surgeon: Molli Hazard, MD;  Location: [facility];  Service: Urology;  Laterality:  Bilateral;  Flexible cystoscopy.  . Cystoscopy with biopsy  11/30/2012    Procedure: CYSTOSCOPY WITH BIOPSY;  Surgeon: Molli Hazard, MD;  Location: [facility];  Service: Urology;  Laterality: N/A;   Family History  Problem Relation Age of Onset  . Diabetes Mother   . Heart disease Brother   . Heart disease Brother   . Heart disease Brother   . Heart disease Brother   . Heart disease Brother   . Heart disease Brother   . Cancer Brother     lung   History  Substance Use Topics  . Smoking status: Former Smoker -- 2.00 packs/day for 22 years    Types: Cigarettes    Quit date: 11/11/1968  . Smokeless tobacco: Never Used  . Alcohol Use: 1.2 oz/week    2 Glasses of wine per week     Comment: Rare    Review of Systems  Constitutional: Negative for fever, diaphoresis, activity change and appetite change.  HENT: Positive for sore throat. Negative for congestion, ear pain, rhinorrhea and sinus pressure.   Eyes: Negative for pain and redness.  Respiratory: Positive for cough, sputum production, shortness of breath and wheezing. Negative for hemoptysis and chest tightness.   Cardiovascular: Negative for chest pain, palpitations, claudication, syncope and PND.  Gastrointestinal: Negative for nausea, vomiting, abdominal pain, diarrhea and abdominal distention.  Genitourinary: Negative for dysuria, flank pain and difficulty urinating.  Musculoskeletal: Negative for back pain, neck pain and neck stiffness.  Skin: Negative for rash and wound.  Neurological: Negative for dizziness, light-headedness, numbness and headaches.  Hematological: Negative for adenopathy.  Psychiatric/Behavioral: Negative for behavioral problems, confusion and agitation.      Allergies  Actos; Aspirin; Ace inhibitors; Bactrim; and Sulfa drugs cross reactors  Home Medications   Current Outpatient Rx  Name  Route  Sig  Dispense  Refill  . acetaminophen (TYLENOL) 500 MG tablet   Oral    Take 1,000 mg by mouth every 6 (six) hours as needed for moderate pain.         Marland Kitchen amLODipine (NORVASC) 5 MG tablet   Oral   Take 5 mg by mouth every morning.          . fluocinonide-emollient (LIDEX-E) 0.05 % cream   Topical   Apply 1 application topically 2 (two) times daily as needed (for skin irritation).         . furosemide (LASIX) 40 MG tablet   Oral   Take 40 mg by mouth every Monday, Wednesday, and Friday.          . gabapentin (NEURONTIN) 100 MG capsule   Oral   Take 100 mg by mouth at bedtime.          Marland Kitchen glipiZIDE (GLUCOTROL XL) 5 MG 24 hr tablet   Oral   Take 5 mg by mouth daily with breakfast.         . guaiFENesin (  DIABETIC TUSSIN EX) 100 MG/5ML liquid   Oral   Take 200 mg by mouth 3 (three) times daily as needed for cough.         . insulin glargine (LANTUS) 100 UNIT/ML injection   Subcutaneous   Inject 30 Units into the skin daily at 3 pm.           BP 121/51  Pulse 126  Temp(Src) 97.3 F (36.3 C) (Oral)  Resp 20  Ht 6\' 2"  (1.88 m)  Wt 215 lb (97.523 kg)  BMI 27.59 kg/m2  SpO2 96% Physical Exam  Constitutional: He is oriented to person, place, and time. He appears well-developed and well-nourished. No distress.  HENT:  Head: Normocephalic and atraumatic.  Nose: Nose normal.  Mouth/Throat: Oropharynx is clear and moist.  Eyes: Conjunctivae and EOM are normal. Pupils are equal, round, and reactive to light.  Neck: Normal range of motion. Neck supple. No tracheal deviation present.  Cardiovascular: Normal rate, regular rhythm, normal heart sounds and intact distal pulses.   Pulmonary/Chest: He is in respiratory distress. He has wheezes. He has rales.  Abdominal: Soft. Bowel sounds are normal. He exhibits no distension. There is no tenderness. There is no rebound and no guarding.  Musculoskeletal: Normal range of motion. He exhibits no edema and no tenderness.  Neurological: He is alert and oriented to person, place, and time.  Skin:  Skin is warm and dry.  Psychiatric: He has a normal mood and affect. His behavior is normal.    ED Course  Procedures (including critical care time) Labs Review Labs Reviewed  CBC WITH DIFFERENTIAL - Abnormal; Notable for the following:    Lymphocytes Relative 11 (*)    Monocytes Relative 15 (*)    All other components within normal limits  COMPREHENSIVE METABOLIC PANEL - Abnormal; Notable for the following:    BUN 32 (*)    Creatinine, Ser 2.08 (*)    GFR calc non Af Amer 28 (*)    GFR calc Af Amer 32 (*)    All other components within normal limits  PRO B NATRIURETIC PEPTIDE  URINALYSIS, ROUTINE W REFLEX MICROSCOPIC  CBG MONITORING, ED  I-STAT TROPOININ, ED  Randolm Idol, ED   Imaging Review Dg Chest 2 View  01/17/2014   CLINICAL DATA:  Shortness of breath and cough  EXAM: CHEST  2 VIEW  COMPARISON:  01/06/2013  FINDINGS: The heart size and mediastinal contours are within normal limits. Both lungs are clear. The visualized skeletal structures are unremarkable.  IMPRESSION: No active cardiopulmonary disease.   Electronically Signed   By: Inez Catalina M.D.   On: 01/17/2014 21:39     Date: 01/17/2014  Rate: 126  Rhythm: sinus tachycardia  QRS Axis: normal  Intervals: normal  ST/T Wave abnormalities: nonspecific ST changes  Conduction Disutrbances:PAC  Narrative Interpretation:   Old EKG Reviewed: Now sinus tachycardic with PAC    MDM   Final diagnoses:  COPD with acute exacerbation  Wheeze  URI, acute   80 yo M with hx of DM, HTN, CAD, bladder CA who is a long term smoker who presents with 1 days hx of nasal congestion , cough and sob. Patient is wheezing on exam. No diagnosis of COPD but has smoked > 30 years. Suspect element of bronchospasm + URI. Will administer albuterol/atrovent, obtain CXR and labs. Case discussed with my attending Dr. Kathrynn Humble.   Patient became tachycardic with albuterol.  Breath sounds somewhat improved. Will give prednisone. Patient  continues to  have new O2 requirement. Patient and family up dated multiple times. Case discussed with Dr. Roel Cluck. Patient admitted for further care. At transfer he remains HDS.         Ruthell Rummage, MD 01/18/14 0000

## 2014-01-17 NOTE — ED Notes (Signed)
Patient presents to ED via POV. Patient is c/o "I just feel bad, I do not feel myself." Patient also c/o "shortness of breath" since yesterday with a non-productive cough and a runny nose. In triage patient had room air O2 sats in the low 80s. Pt is A&Ox4. No acute distress noted at this time. Placed on a NRB mask and O2 sats 100% at this time.

## 2014-01-17 NOTE — Progress Notes (Signed)
Patient peak flow 240 at this time, still on CAT.

## 2014-01-17 NOTE — H&P (Signed)
PCP:    [name], MD  Pulmonology [facility] Cardiology Dr. Verl Blalock  Chief Complaint:  Shortness of breath  HPI: [name] Small is a 80 y.o. male   has a past medical history of BENIGN PROSTATIC HYPERTROPHY (05/21/2007); DIABETES MELLITUS, TYPE II (05/21/2007); DIVERTICULOSIS, COLON (05/21/2007); HYPERTENSION (05/21/2007); PSORIASIS, SCALP (10/25/2008); Cellulitis of left foot; Gross hematuria; Psoriasis (ELBOWS); Fatigue (AGE-RELATED); Arthritis; CAD (coronary artery disease) (CARDIOLOGIST- DR WALL - VISIT IN JUNE 2012 FOR CATH); Impaired hearing (BILATERAL HEARING AIDS); Bladder cancer; Chronic kidney disease (CKD), stage III (moderate) (12/04/2007); and Back pain.   Presented with  Sudden onset of , coughing but not productive, denies any fever no chills at home but found febrile up to 101.2 in ER, reports shortness of breath, diffuse body aches. Denies any nausea vomiting or diarrhea no chest pain.His wife states she had some allergies recently but denies other sick contacts. He presented to ER and was found to be hypoxic requiring 4L of oxygen. He was given Albuterol and became tachycardic but was also noted to be febrile at the same time. Blood pressure has been fluctuating with that last reading down to 100/69.  Patient states he is feeling somewhat better. IN ER he was given prednisone 60 mg po but currently improved air movement Review of Systems:    Pertinent positives include: chills, body aches, shortness of breath at rest. non-productive cough,   Constitutional:  No weight loss, night sweats, Fevers,  fatigue, weight loss  HEENT:  No headaches, Difficulty swallowing,Tooth/dental problems,Sore throat,  No sneezing, itching, ear ache, nasal congestion, post nasal drip,  Cardio-vascular:  No chest pain, Orthopnea, PND, anasarca, dizziness, palpitations.no Bilateral lower extremity swelling  GI:  No heartburn, indigestion, abdominal pain, nausea, vomiting, diarrhea, change in bowel  habits, loss of appetite, melena, blood in stool, hematemesis Resp:   No dyspnea on exertion, No excess mucus, no productive cough, No No coughing up of blood.No change in color of mucus.No wheezing. Skin:  no rash or lesions. No jaundice GU:  no dysuria, change in color of urine, no urgency or frequency. No straining to urinate.  No flank pain.  Musculoskeletal:  No joint pain or no joint swelling. No decreased range of motion. No back pain.  Psych:  No change in mood or affect. No depression or anxiety. No memory loss.  Neuro: no localizing neurological complaints, no tingling, no weakness, no double vision, no gait abnormality, no slurred speech, no confusion  Otherwise ROS are negative except for above, 10 systems were reviewed  Past Medical History: Past Medical History  Diagnosis Date  . BENIGN PROSTATIC HYPERTROPHY 05/21/2007  . DIABETES MELLITUS, TYPE II 05/21/2007  . DIVERTICULOSIS, COLON 05/21/2007    pt denies  . HYPERTENSION 05/21/2007  . PSORIASIS, SCALP 10/25/2008  . Cellulitis of left foot     Hospitalized in 2006  . Gross hematuria     pt denies  . Psoriasis ELBOWS  . Fatigue AGE-RELATED  . Arthritis   . CAD (coronary artery disease) CARDIOLOGIST- DR WALL - VISIT IN JUNE 2012 FOR CATH    nonobstructive by cath 6/12:  mid LAD 30%, proximal obtuse marginal-2 30%, proximal RCA 20%, mid RCA 30-40%.  He had normal cardiac output and mildly elevated filling pressures but no significant pulmonary hypertension;   Echocardiogram in May 2012 demonstrated EF 50-55% and left atrial enlargement   . Impaired hearing BILATERAL HEARING AIDS  . Bladder cancer     2013  . Chronic kidney disease (CKD), stage  III (moderate) 12/04/2007    FOLLOWED BY DR PATEL  . Back pain     s/p lumbar injection 2014   Past Surgical History  Procedure Laterality Date  . Vasectomy  15 YRS AGO  W/ ANES.  Marland Kitchen Penile prosthesis implant  15 YRS AGO  . Cardiovascular stress test  2007  . Cardiac  catheterization  04-24-11/  DR MUHAMMAD ARIDA    MILD NONOBSTRUCTIVE CAD, NORMA CARDIAC OUTPUT  . Appendectomy  AGE 16  . Tonsillectomy and adenoidectomy  AGE 45 YRS AGO  . Cataract extraction w/ intraocular lens  implant, bilateral    . Incision and drainage foot  LEFT FOOT DUE TO INFECTION FROM  NAIL PUNCTURE INJURY  . Cystoscopy w/ retrogrades  10/30/2011    Procedure: CYSTOSCOPY WITH RETROGRADE PYELOGRAM;  Surgeon: Molli Hazard, MD;  Location: [facility];  Service: Urology;  Laterality: Bilateral;  CYSTOSCOPY POSS TURBT BILATERAL RETROGRADE PYLEOGRAM POSS BILATERAL URETEROSCOPY WITH BIOPSY LASER ABLATION OF LESION POSS BILATERAL URETERAL STENT PLACEMENT   CARM FLEXIBLE URETERAL SCOPE DIGITAL URETERAL SCOPE PK GYRUS HO  . Transurethral resection of bladder tumor  10/30/2011    Procedure: TRANSURETHRAL RESECTION OF BLADDER TUMOR (TURBT);  Surgeon: Molli Hazard, MD;  Location: [facility];  Service: Urology;  Laterality: N/A;  . Cystoscopy  12/25/2011    Procedure: CYSTOSCOPY;  Surgeon: Molli Hazard, MD;  Location: [facility];  Service: Urology;  Laterality: N/A;  needs intubation and to be paralyzed   . Transurethral resection of bladder tumor  12/25/2011    Procedure: TRANSURETHRAL RESECTION OF BLADDER TUMOR (TURBT);  Surgeon: Molli Hazard, MD;  Location: [facility];  Service: Urology;  Laterality: N/A;  need long gyrus instruments Dr states he has spoken w/ Nelwyn Salisbury regarding this    . Cystoscopy w/ retrogrades  11/30/2012    Procedure: CYSTOSCOPY WITH RETROGRADE PYELOGRAM;  Surgeon: Molli Hazard, MD;  Location: [facility];  Service: Urology;  Laterality: Bilateral;  Flexible cystoscopy.  . Cystoscopy with biopsy  11/30/2012    Procedure: CYSTOSCOPY WITH BIOPSY;  Surgeon: Molli Hazard, MD;  Location: [facility];  Service: Urology;  Laterality:  N/A;     Medications: Prior to Admission medications   Medication Sig Start Date End Date Taking? Authorizing Provider  acetaminophen (TYLENOL) 500 MG tablet Take 1,000 mg by mouth every 6 (six) hours as needed for moderate pain.   Yes Historical Provider, MD  amLODipine (NORVASC) 5 MG tablet Take 5 mg by mouth every morning.    Yes Historical Provider, MD  fluocinonide-emollient (LIDEX-E) 0.05 % cream Apply 1 application topically 2 (two) times daily as needed (for skin irritation).   Yes Historical Provider, MD  furosemide (LASIX) 40 MG tablet Take 40 mg by mouth every Monday, Wednesday, and Friday.    Yes Historical Provider, MD  gabapentin (NEURONTIN) 100 MG capsule Take 100 mg by mouth at bedtime.    Yes Historical Provider, MD  glipiZIDE (GLUCOTROL XL) 5 MG 24 hr tablet Take 5 mg by mouth daily with breakfast.   Yes Historical Provider, MD  guaiFENesin (DIABETIC TUSSIN EX) 100 MG/5ML liquid Take 200 mg by mouth 3 (three) times daily as needed for cough.   Yes Historical Provider, MD  insulin glargine (LANTUS) 100 UNIT/ML injection Inject 30 Units into the skin daily at 3 pm.    Yes Historical Provider, MD    Allergies:   Allergies  Allergen  Reactions  . Actos [Pioglitazone Hydrochloride] Other (See Comments)    Held 2012 bladder cancer  . Aspirin Other (See Comments)    Held 2012 due to hematuria and bruising.   . Ace Inhibitors Cough  . Bactrim Rash and Other (See Comments)    Rash, presumed allergy  . Sulfa Drugs Cross Reactors Rash    Social History:  Ambulatory independently   Lives at   Home with wife   reports that he quit smoking about 45 years ago. His smoking use included Cigarettes. He has a 44 pack-year smoking history. He has never used smokeless tobacco. He reports that he drinks about 1.2 ounces of alcohol per week. He reports that he does not use illicit drugs.   Family History: family history includes Cancer in his brother; Diabetes in his mother; Heart  disease in his brother, brother, brother, brother, brother, and brother.    Physical Exam: Patient Vitals for the past 24 hrs:  BP Temp Temp src Pulse Resp SpO2 Height Weight  01/17/14 2315 121/51 mmHg - - 126 20 96 % - -  01/17/14 2300 101/59 mmHg - - 127 17 99 % - -  01/17/14 2245 105/54 mmHg - - 131 20 97 % - -  01/17/14 2236 88/44 mmHg - - 139 15 95 % - -  01/17/14 2232 89/43 mmHg - - - - - - -  01/17/14 2221 91/42 mmHg - - - - - - -  01/17/14 2216 89/45 mmHg - - 132 20 94 % - -  01/17/14 2210 89/45 mmHg - - 138 11 94 % - -  01/17/14 2145 115/96 mmHg - - 136 21 98 % - -  01/17/14 2131 110/52 mmHg - - 127 15 99 % - -  01/17/14 2100 136/47 mmHg - - 83 16 99 % - -  01/17/14 2052 - - - - - 98 % - -  01/17/14 2045 148/61 mmHg - - 79 21 94 % - -  01/17/14 2030 154/73 mmHg - - 77 15 95 % - -  01/17/14 2015 154/64 mmHg - - 77 25 96 % - -  01/17/14 2007 - - - 73 22 100 % - -  01/17/14 2001 - - - - - 100 % - -  01/17/14 2000 136/63 mmHg - - 75 14 100 % - -  01/17/14 1957 - - - - - 100 % - -  01/17/14 1955 162/102 mmHg 97.3 F (36.3 C) Oral 80 - 100 % 6\' 2"  (1.88 m) 97.523 kg (215 lb)    1. General:  in No Acute distress 2. Psychological: Alert and  Oriented 3. Head/ENT:     Dry Mucous Membranes                          Head Non traumatic, neck supple                          Normal  Dentition 4. SKIN: decreased Skin turgor,  Skin clean Dry and intact no rash 5. Heart: bounding, rapid irregular rate and rhythm no Murmur, Rub or gallop 6. Lungs:mild occasional wheezes no  Crackles, distant breath sounds 7. Abdomen: Soft, non-tender, slightly distended, diminished bowel sounds 8. Lower extremities: no clubbing, cyanosis, or edema 9. Neurologically Grossly intact, moving all 4 extremities equally 10. MSK: Normal range of motion  body mass index is 27.59 kg/(m^2).   Labs on  Admission:   Recent Labs  01/17/14 2044  NA 141  K 4.8  CL 98  CO2 31  GLUCOSE 86  BUN 32*   CREATININE 2.08*  CALCIUM 8.5    Recent Labs  01/17/14 2044  AST 16  ALT 8  ALKPHOS 63  BILITOT 0.3  PROT 7.1  ALBUMIN 3.6   No results found for this basename: LIPASE, AMYLASE,  in the last 72 hours  Recent Labs  01/17/14 2044  WBC 6.6  NEUTROABS 4.7  HGB 15.2  HCT 45.8  MCV 100.0  PLT 163   No results found for this basename: CKTOTAL, CKMB, CKMBINDEX, TROPONINI,  in the last 72 hours No results found for this basename: TSH, T4TOTAL, FREET3, T3FREE, THYROIDAB,  in the last 72 hours No results found for this basename: VITAMINB12, FOLATE, FERRITIN, TIBC, IRON, RETICCTPCT,  in the last 72 hours Lab Results  Component Value Date   HGBA1C 9.9* 12/20/2013    Estimated Creatinine Clearance: 31.3 ml/min (by C-G formula based on Cr of 2.08). ABG    Component Value Date/Time   PHART 7.334* 04/24/2011 1056   HCO3 31.8* 04/24/2011 1101   TCO2 36 11/30/2012 1127   O2SAT 63.0 04/24/2011 1101     No results found for this basename: DDIMER     Other results:  I have pearsonaly reviewed this: ECG REPORT  Rate: 126  Rhythm: sinus tachycardia ST&T Change: no ischemic changes   Cultures: No results found for this basename: sdes, specrequest, cult, reptstatus       Radiological Exams on Admission: Dg Chest 2 View  01/17/2014   CLINICAL DATA:  Shortness of breath and cough  EXAM: CHEST  2 VIEW  COMPARISON:  01/06/2013  FINDINGS: The heart size and mediastinal contours are within normal limits. Both lungs are clear. The visualized skeletal structures are unremarkable.  IMPRESSION: No active cardiopulmonary disease.   Electronically Signed   By: Inez Catalina M.D.   On: 01/17/2014 21:39    Chart has been reviewed  Assessment/Plan  80 yo M here with SIRS/ sepsis and COPD exacerbation  Present on Admission:  . SIRS (systemic inflammatory response syndrome)/vs sepsis- admit to step down, IV fluid resuscitation broad-spectrum antibiotics blood cultures, evaluate influenza and  cover with Tamiflu for now.   . Diabetes with neurologic complications - SSI, given deceased PO intake will decrease lantus to 15 units, hold po meds . HYPERTENSION - transient hypotension will hold po meds . Chronic kidney disease, stage III (moderate) - avoid renal-toxic medications, at this point cr at baseline will need to follow up . COPD with acute exacerbation - cover with antibiotics, xopenex PRN and Atrovent, add dulera, hold on on prednisone given minimal wheezing and possible sepsis,  hypoxia most likely due to COPD exacerbation but will check d.dimer, no significant chest pain.  . Atrial fibrillation - currently in sinus tachycardia, not a candidate for anticoagulation due to hx of easy bleding and bruising with aspirin  Prophylaxis:  Lovenox, Protonix  CODE STATUS: DNR/DNI  Other plan as per orders.  I have spent a total of 65 min on this admission care was discussed with CCM who will help monitoring on e-link  Johnthan Axtman 01/17/2014, 11:51 PM

## 2014-01-17 NOTE — ED Notes (Signed)
Patient's blood pressure reading 177/140 and 179/157 on left arm. This RN switched the cuff to the right arm and bp read 184/159.  Dr. Judithann Graves ordered patient to have labetalol 10mg .  When this RN entered to room to give medication- patients blood pressure was reading 89/45. Verbal order to hold medication at this time.

## 2014-01-17 NOTE — Progress Notes (Signed)
RT called for low SpO2. Tried patient on 6L [place], patient SpO2 86%. Patient currently on nonrebreather mask at 93%. RT will continue to monitor.

## 2014-01-17 NOTE — ED Notes (Signed)
Dr. Judithann Graves at bedside.

## 2014-01-17 NOTE — ED Notes (Signed)
Informed patient that a urine sample is needed- provided patient with a urinal at this time.

## 2014-01-17 NOTE — Progress Notes (Signed)
Patient HR increased from 79 to 140, taken off of CAT at this time. Patient still with wheezing and rhonchi. RT will continue to monitor.

## 2014-01-17 NOTE — ED Notes (Signed)
Dr. Judithann Graves aware of manual blood pressure readings.

## 2014-01-18 ENCOUNTER — Telehealth: Payer: Self-pay

## 2014-01-18 ENCOUNTER — Encounter (HOSPITAL_COMMUNITY): Payer: Self-pay | Admitting: *Deleted

## 2014-01-18 DIAGNOSIS — J9600 Acute respiratory failure, unspecified whether with hypoxia or hypercapnia: Secondary | ICD-10-CM

## 2014-01-18 DIAGNOSIS — R6510 Systemic inflammatory response syndrome (SIRS) of non-infectious origin without acute organ dysfunction: Secondary | ICD-10-CM | POA: Diagnosis present

## 2014-01-18 DIAGNOSIS — I519 Heart disease, unspecified: Secondary | ICD-10-CM

## 2014-01-18 DIAGNOSIS — A419 Sepsis, unspecified organism: Secondary | ICD-10-CM | POA: Diagnosis present

## 2014-01-18 LAB — CBC
HCT: 40.1 % (ref 39.0–52.0)
Hemoglobin: 13 g/dL (ref 13.0–17.0)
MCH: 32.7 pg (ref 26.0–34.0)
MCHC: 32.4 g/dL (ref 30.0–36.0)
MCV: 101 fL — ABNORMAL HIGH (ref 78.0–100.0)
Platelets: 151 10*3/uL (ref 150–400)
RBC: 3.97 MIL/uL — ABNORMAL LOW (ref 4.22–5.81)
RDW: 13.1 % (ref 11.5–15.5)
WBC: 5.9 10*3/uL (ref 4.0–10.5)

## 2014-01-18 LAB — URINALYSIS, ROUTINE W REFLEX MICROSCOPIC
Bilirubin Urine: NEGATIVE
Glucose, UA: >1000 mg/dL — AB
Hgb urine dipstick: NEGATIVE
Ketones, ur: NEGATIVE mg/dL
Leukocytes, UA: NEGATIVE
Nitrite: NEGATIVE
Protein, ur: 30 mg/dL — AB
Specific Gravity, Urine: 1.02 (ref 1.005–1.030)
Urobilinogen, UA: 0.2 mg/dL (ref 0.0–1.0)
pH: 6 (ref 5.0–8.0)

## 2014-01-18 LAB — COMPREHENSIVE METABOLIC PANEL
ALT: 7 U/L (ref 0–53)
AST: 16 U/L (ref 0–37)
Albumin: 2.8 g/dL — ABNORMAL LOW (ref 3.5–5.2)
Alkaline Phosphatase: 49 U/L (ref 39–117)
BUN: 36 mg/dL — ABNORMAL HIGH (ref 6–23)
CO2: 22 mEq/L (ref 19–32)
Calcium: 7.4 mg/dL — ABNORMAL LOW (ref 8.4–10.5)
Chloride: 97 mEq/L (ref 96–112)
Creatinine, Ser: 2.35 mg/dL — ABNORMAL HIGH (ref 0.50–1.35)
GFR calc Af Amer: 28 mL/min — ABNORMAL LOW (ref >90)
GFR calc non Af Amer: 24 mL/min — ABNORMAL LOW (ref >90)
Glucose, Bld: 397 mg/dL — ABNORMAL HIGH (ref 70–99)
Potassium: 4.9 mEq/L (ref 3.7–5.3)
Sodium: 136 mEq/L — ABNORMAL LOW (ref 137–147)
Total Bilirubin: 0.2 mg/dL — ABNORMAL LOW (ref 0.3–1.2)
Total Protein: 5.6 g/dL — ABNORMAL LOW (ref 6.0–8.3)

## 2014-01-18 LAB — PROCALCITONIN: Procalcitonin: 1.53 ng/mL

## 2014-01-18 LAB — GLUCOSE, CAPILLARY
Glucose-Capillary: 460 mg/dL — ABNORMAL HIGH (ref 70–99)
Glucose-Capillary: 462 mg/dL — ABNORMAL HIGH (ref 70–99)
Glucose-Capillary: 350 mg/dL — ABNORMAL HIGH (ref 70–99)
Glucose-Capillary: 52 mg/dL — ABNORMAL LOW (ref 70–99)
Glucose-Capillary: 37 mg/dL — CL (ref 70–99)
Glucose-Capillary: 88 mg/dL (ref 70–99)
Glucose-Capillary: 62 mg/dL — ABNORMAL LOW (ref 70–99)
Glucose-Capillary: 131 mg/dL — ABNORMAL HIGH (ref 70–99)
Glucose-Capillary: 92 mg/dL (ref 70–99)

## 2014-01-18 LAB — TROPONIN I
Troponin I: <0.3 ng/mL (ref <0.30)
Troponin I: <0.3 ng/mL (ref <0.30)
Troponin I: 0.4 ng/mL (ref <0.30)

## 2014-01-18 LAB — LACTIC ACID, PLASMA: Lactic Acid, Venous: 2.7 mmol/L — ABNORMAL HIGH (ref 0.5–2.2)

## 2014-01-18 LAB — MAGNESIUM: Magnesium: 1.9 mg/dL (ref 1.5–2.5)

## 2014-01-18 LAB — INFLUENZA PANEL BY PCR (TYPE A & B)
H1N1 flu by pcr: NOT DETECTED
Influenza A By PCR: POSITIVE — AB
Influenza B By PCR: NEGATIVE

## 2014-01-18 LAB — EXPECTORATED SPUTUM ASSESSMENT W GRAM STAIN, RFLX TO RESP C: Special Requests: NORMAL

## 2014-01-18 LAB — URINE MICROSCOPIC-ADD ON

## 2014-01-18 LAB — TSH: TSH: 0.927 u[IU]/mL (ref 0.350–4.500)

## 2014-01-18 LAB — D-DIMER, QUANTITATIVE: D-Dimer, Quant: 1.38 ug/mL-FEU — ABNORMAL HIGH (ref 0.00–0.48)

## 2014-01-18 LAB — HEMOGLOBIN A1C
Hgb A1c MFr Bld: 9.2 % — ABNORMAL HIGH (ref <5.7)
Mean Plasma Glucose: 217 mg/dL — ABNORMAL HIGH (ref <117)

## 2014-01-18 LAB — MRSA PCR SCREENING: MRSA by PCR: NEGATIVE

## 2014-01-18 LAB — PHOSPHORUS: Phosphorus: 3.1 mg/dL (ref 2.3–4.6)

## 2014-01-18 MED ORDER — PANTOPRAZOLE SODIUM 40 MG PO TBEC
40.0000 mg | DELAYED_RELEASE_TABLET | Freq: Every day | ORAL | Status: DC
Start: 2014-01-18 — End: 2014-01-21
  Administered 2014-01-18 – 2014-01-20 (×3): 40 mg via ORAL
  Filled 2014-01-18 (×3): qty 1

## 2014-01-18 MED ORDER — DEXTROSE 50 % IV SOLN
50.0000 mL | INTRAVENOUS | Status: DC | PRN
  Administered 2014-01-18: 50 mL via INTRAVENOUS

## 2014-01-18 MED ORDER — DEXTROSE 50 % IV SOLN
INTRAVENOUS | Status: AC
  Administered 2014-01-18: 50 mL
  Filled 2014-01-18: qty 50

## 2014-01-18 MED ORDER — ONDANSETRON HCL 4 MG/2ML IJ SOLN
4.0000 mg | Freq: Four times a day (QID) | INTRAMUSCULAR | Status: DC | PRN
  Administered 2014-01-18: 4 mg via INTRAVENOUS
  Filled 2014-01-18: qty 2

## 2014-01-18 MED ORDER — GLIPIZIDE ER 5 MG PO TB24
5.0000 mg | ORAL_TABLET | Freq: Every day | ORAL | Status: DC
  Filled 2014-01-18: qty 1

## 2014-01-18 MED ORDER — INSULIN ASPART 100 UNIT/ML ~~LOC~~ SOLN
0.0000 [IU] | Freq: Three times a day (TID) | SUBCUTANEOUS | Status: DC
  Administered 2014-01-18: 11 [IU] via SUBCUTANEOUS
  Administered 2014-01-18: 15 [IU] via SUBCUTANEOUS
  Administered 2014-01-20 (×2): 2 [IU] via SUBCUTANEOUS

## 2014-01-18 MED ORDER — DOCUSATE SODIUM 100 MG PO CAPS
100.0000 mg | ORAL_CAPSULE | Freq: Two times a day (BID) | ORAL | Status: DC
  Administered 2014-01-18 – 2014-01-21 (×6): 100 mg via ORAL
  Filled 2014-01-18 (×7): qty 1

## 2014-01-18 MED ORDER — IPRATROPIUM BROMIDE 0.02 % IN SOLN
0.5000 mg | Freq: Four times a day (QID) | RESPIRATORY_TRACT | Status: DC
  Administered 2014-01-18 – 2014-01-21 (×13): 0.5 mg via RESPIRATORY_TRACT
  Filled 2014-01-18 (×14): qty 2.5

## 2014-01-18 MED ORDER — INSULIN ASPART 100 UNIT/ML ~~LOC~~ SOLN: 0.0000 [IU] | Freq: Every day | SUBCUTANEOUS | Status: DC

## 2014-01-18 MED ORDER — ACETAMINOPHEN 650 MG RE SUPP: 650.0000 mg | Freq: Four times a day (QID) | RECTAL | Status: DC | PRN

## 2014-01-18 MED ORDER — SODIUM CHLORIDE 0.9 % IV SOLN
INTRAVENOUS | Status: AC
  Administered 2014-01-18: 02:00:00 via INTRAVENOUS

## 2014-01-18 MED ORDER — GUAIFENESIN-DM 100-10 MG/5ML PO SYRP: 5.0000 mL | ORAL_SOLUTION | ORAL | Status: DC | PRN

## 2014-01-18 MED ORDER — INSULIN GLARGINE 100 UNIT/ML ~~LOC~~ SOLN
15.0000 [IU] | Freq: Every day | SUBCUTANEOUS | Status: DC
  Filled 2014-01-18: qty 0.15

## 2014-01-18 MED ORDER — GLUCERNA SHAKE PO LIQD
237.0000 mL | Freq: Three times a day (TID) | ORAL | Status: DC
  Administered 2014-01-18 – 2014-01-20 (×5): 237 mL via ORAL

## 2014-01-18 MED ORDER — PIPERACILLIN-TAZOBACTAM 3.375 G IVPB
3.3750 g | Freq: Three times a day (TID) | INTRAVENOUS | Status: DC
  Administered 2014-01-18: 3.375 g via INTRAVENOUS
  Filled 2014-01-18 (×3): qty 50

## 2014-01-18 MED ORDER — DEXTROSE 50 % IV SOLN: 25.0000 mL | Freq: Once | INTRAVENOUS | Status: AC | PRN

## 2014-01-18 MED ORDER — SODIUM CHLORIDE 0.9 % IJ SOLN
3.0000 mL | Freq: Two times a day (BID) | INTRAMUSCULAR | Status: DC
  Administered 2014-01-18 – 2014-01-21 (×5): 3 mL via INTRAVENOUS

## 2014-01-18 MED ORDER — ENOXAPARIN SODIUM 30 MG/0.3ML ~~LOC~~ SOLN
30.0000 mg | SUBCUTANEOUS | Status: DC
  Administered 2014-01-18 – 2014-01-21 (×4): 30 mg via SUBCUTANEOUS
  Filled 2014-01-18 (×4): qty 0.3

## 2014-01-18 MED ORDER — LEVALBUTEROL HCL 0.63 MG/3ML IN NEBU
0.6300 mg | INHALATION_SOLUTION | Freq: Four times a day (QID) | RESPIRATORY_TRACT | Status: DC | PRN
  Filled 2014-01-18: qty 3

## 2014-01-18 MED ORDER — DEXTROSE 5 % IV SOLN
INTRAVENOUS | Status: DC
  Administered 2014-01-18 – 2014-01-19 (×2): via INTRAVENOUS

## 2014-01-18 MED ORDER — DEXTROSE 50 % IV SOLN
INTRAVENOUS | Status: AC
  Filled 2014-01-18: qty 50

## 2014-01-18 MED ORDER — ONDANSETRON HCL 4 MG PO TABS: 4.0000 mg | ORAL_TABLET | Freq: Four times a day (QID) | ORAL | Status: DC | PRN

## 2014-01-18 MED ORDER — LEVOFLOXACIN IN D5W 750 MG/150ML IV SOLN
750.0000 mg | INTRAVENOUS | Status: DC
  Administered 2014-01-18: 750 mg via INTRAVENOUS
  Filled 2014-01-18: qty 150

## 2014-01-18 MED ORDER — OSELTAMIVIR PHOSPHATE 30 MG PO CAPS
30.0000 mg | ORAL_CAPSULE | Freq: Two times a day (BID) | ORAL | Status: DC
  Administered 2014-01-18 (×2): 30 mg via ORAL
  Filled 2014-01-18 (×3): qty 1

## 2014-01-18 MED ORDER — VANCOMYCIN HCL IN DEXTROSE 1-5 GM/200ML-% IV SOLN: 1000.0000 mg | INTRAVENOUS | Status: DC

## 2014-01-18 MED ORDER — HYDROCODONE-ACETAMINOPHEN 5-325 MG PO TABS: 1.0000 | ORAL_TABLET | ORAL | Status: DC | PRN

## 2014-01-18 MED ORDER — ENOXAPARIN SODIUM 40 MG/0.4ML ~~LOC~~ SOLN
40.0000 mg | SUBCUTANEOUS | Status: DC
  Filled 2014-01-18: qty 0.4

## 2014-01-18 MED ORDER — VANCOMYCIN HCL 10 G IV SOLR
2000.0000 mg | Freq: Once | INTRAVENOUS | Status: AC
  Administered 2014-01-18: 2000 mg via INTRAVENOUS
  Filled 2014-01-18: qty 2000

## 2014-01-18 MED ORDER — MOMETASONE FURO-FORMOTEROL FUM 100-5 MCG/ACT IN AERO
2.0000 | INHALATION_SPRAY | Freq: Two times a day (BID) | RESPIRATORY_TRACT | Status: DC
  Administered 2014-01-18 – 2014-01-21 (×7): 2 via RESPIRATORY_TRACT
  Filled 2014-01-18: qty 8.8

## 2014-01-18 MED ORDER — INSULIN GLARGINE 100 UNIT/ML ~~LOC~~ SOLN
30.0000 [IU] | Freq: Every day | SUBCUTANEOUS | Status: DC
Start: 2014-01-18 — End: 2014-01-18
  Filled 2014-01-18: qty 0.3

## 2014-01-18 MED ORDER — OSELTAMIVIR PHOSPHATE 30 MG PO CAPS
30.0000 mg | ORAL_CAPSULE | Freq: Every day | ORAL | Status: DC
  Administered 2014-01-19 – 2014-01-21 (×3): 30 mg via ORAL
  Filled 2014-01-18 (×3): qty 1

## 2014-01-18 MED ORDER — SODIUM CHLORIDE 0.9 % IV BOLUS (SEPSIS)
1000.0000 mL | Freq: Once | INTRAVENOUS | Status: AC
  Administered 2014-01-18: 1000 mL via INTRAVENOUS

## 2014-01-18 MED ORDER — ACETAMINOPHEN 325 MG PO TABS: 650.0000 mg | ORAL_TABLET | Freq: Four times a day (QID) | ORAL | Status: DC | PRN

## 2014-01-18 MED ORDER — GLIPIZIDE ER 5 MG PO TB24
5.0000 mg | ORAL_TABLET | Freq: Every day | ORAL | Status: DC
  Administered 2014-01-18: 5 mg via ORAL
  Filled 2014-01-18: qty 1

## 2014-01-18 MED ORDER — GUAIFENESIN-DM 100-10 MG/5ML PO SYRP
5.0000 mL | ORAL_SOLUTION | ORAL | Status: DC
  Administered 2014-01-18 – 2014-01-21 (×14): 5 mL via ORAL
  Filled 2014-01-18 (×24): qty 5

## 2014-01-18 MED ORDER — INSULIN GLARGINE 100 UNIT/ML ~~LOC~~ SOLN
30.0000 [IU] | SUBCUTANEOUS | Status: AC
  Administered 2014-01-18: 30 [IU] via SUBCUTANEOUS
  Filled 2014-01-18: qty 0.3

## 2014-01-18 MED ORDER — GABAPENTIN 100 MG PO CAPS
100.0000 mg | ORAL_CAPSULE | Freq: Every day | ORAL | Status: DC
  Administered 2014-01-19 – 2014-01-20 (×2): 100 mg via ORAL
  Filled 2014-01-18 (×4): qty 1

## 2014-01-18 MED ORDER — GUAIFENESIN ER 600 MG PO TB12
600.0000 mg | ORAL_TABLET | Freq: Two times a day (BID) | ORAL | Status: DC
  Administered 2014-01-18 – 2014-01-21 (×6): 600 mg via ORAL
  Filled 2014-01-18 (×8): qty 1

## 2014-01-18 NOTE — Progress Notes (Signed)
[facility] Progress Note [name] TEAM 1 - Stepdown/ICU TEAM   MADDON KINNEMAN [name] DOB: 05-06-1928 DOA: 01/17/2014 PCP: [name], MD  Brief narrative: [name] Small is a 80 y.o. male presenting on 01/17/2014 with  has a past medical history of BENIGN PROSTATIC HYPERTROPH DIABETES MELLITUS, TYPE II ; DIVERTICULOSIS, COLON; HYPERTENSION; PSORIASIS, SCALP  Cellulitis of left foot; Gross hematuria; Psoriasis (ELBOWS); Fatigue (AGE-RELATED); Arthritis; CAD (coronary artery disease) (CARDIOLOGIST- DR WALL - VISIT IN JUNE 2012 FOR CATH); Impaired hearing (BILATERAL HEARING AIDS); Bladder cancer; Chronic kidney disease (CKD), stage III (moderate) (12/04/2007); and Back pain who presents with cough now productive and noted to be febrile and hypoxic in ER.    Subjective: Feeling much better. No new complaints.   Assessment/Plan: Principal Problem:   Acute respiratory failure/ Sepsis - Influenza A - cont Tamiflu- stop antibiotics - cont to follow in SDU until O2 requirements decreased  Active Problems:  COPD with acute exacerbation - improved- did not receive any steroids after Prednisone in ER- will hold off on further steroids except inhaled    Diabetes with neurologic complications - increase Lantus to home dose    Hypotension with h/o HYPERTENSION - home meds on hold    Chronic kidney disease, stage III (moderate) - stable    Atrial fibrillation - sinus rhythm - not on anticoagulation?  Mildly elevated TN I - likely due to resp distress- cont to follow Troponins- EKG not significant   Code Status: DNR Family Communication: with wife, daughter and son in law Disposition Plan: PT eval  Consultants: none  Procedures: none  Antibiotics: Antibiotics Given (last 72 hours)   Date/Time Action Medication Dose Rate   01/18/14 0218 Given   vancomycin (VANCOCIN) 2,000 mg in sodium chloride 0.9 % 500 mL IVPB 2,000 mg 250 mL/hr   01/18/14 0223 Given    piperacillin-tazobactam (ZOSYN) IVPB 3.375 g 3.375 g 12.5 mL/hr   01/18/14 0229 Given   oseltamivir (TAMIFLU) capsule 30 mg 30 mg    01/18/14 0949 Given   oseltamivir (TAMIFLU) capsule 30 mg 30 mg    01/18/14 1328 Given   levofloxacin (LEVAQUIN) IVPB 750 mg 750 mg 100 mL/hr       DVT prophylaxis: Lovenox  Objective: Filed Weights   01/17/14 1955  Weight: 97.523 kg (215 lb)   Blood pressure 113/68, pulse 65, temperature 98.7 F (37.1 C), temperature source Oral, resp. rate 18, height 6\' 2"  (1.88 m), weight 97.523 kg (215 lb), SpO2 100.00%.  Intake/Output Summary (Last 24 hours) at 01/18/14 1739 Last data filed at 01/18/14 0900  Gross per 24 hour  Intake   2570 ml  Output    900 ml  Net   1670 ml     Exam: General: No acute respiratory distress Lungs: Clear to auscultation bilaterally without wheezes or crackles Cardiovascular: Regular rate and rhythm without murmur gallop or rub normal S1 and S2 Abdomen: Nontender, nondistended, soft, bowel sounds positive, no rebound, no ascites, no appreciable mass Extremities: No significant cyanosis, clubbing, or edema bilateral lower extremities  Data Reviewed: Basic Metabolic Panel:  Recent Labs Lab 01/14/14 1018 01/17/14 2044 01/18/14 0500  NA 140 141 136*  K 4.6 4.8 4.9  CL 96 98 97  CO2 33* 31 22  GLUCOSE 103* 86 397*  BUN 42* 32* 36*  CREATININE 2.3* 2.08* 2.35*  CALCIUM 9.3 8.5 7.4*  MG  --   --  1.9  PHOS  --   --  3.1  Liver Function Tests:  Recent Labs Lab 01/17/14 2044 01/18/14 0500  AST 16 16  ALT 8 7  ALKPHOS 63 49  BILITOT 0.3 0.2*  PROT 7.1 5.6*  ALBUMIN 3.6 2.8*   No results found for this basename: LIPASE, AMYLASE,  in the last 168 hours No results found for this basename: AMMONIA,  in the last 168 hours CBC:  Recent Labs Lab 01/17/14 2044 01/18/14 0730  WBC 6.6 5.9  NEUTROABS 4.7  --   HGB 15.2 13.0  HCT 45.8 40.1  MCV 100.0 101.0*  PLT 163 151   Cardiac Enzymes:  Recent  Labs Lab 01/18/14 0500 01/18/14 0730 01/18/14 1415  TROPONINI <0.30 <0.30 0.40*   BNP (last 3 results)  Recent Labs  01/17/14 2045  PROBNP 267.3   CBG:  Recent Labs Lab 01/17/14 1947 01/18/14 0821 01/18/14 0822 01/18/14 1137 01/18/14 1715  GLUCAP 84 460* 462* 350* 52*    Recent Results (from the past 240 hour(s))  MRSA PCR SCREENING     Status: None   Collection Time    01/18/14  1:46 AM      Result Value Ref Range Status   MRSA by PCR NEGATIVE  NEGATIVE Final   Comment:            The GeneXpert MRSA Assay (FDA     approved for NASAL specimens     only), is one component of a     comprehensive MRSA colonization     surveillance program. It is not     intended to diagnose MRSA     infection nor to guide or     monitor treatment for     MRSA infections.  CULTURE, EXPECTORATED SPUTUM-ASSESSMENT     Status: None   Collection Time    01/18/14  3:07 PM      Result Value Ref Range Status   Specimen Description SPUTUM   Final   Special Requests Normal   Final   Sputum evaluation     Final   Value: MICROSCOPIC FINDINGS SUGGEST THAT THIS SPECIMEN IS NOT REPRESENTATIVE OF LOWER RESPIRATORY SECRETIONS. PLEASE RECOLLECT.     CALLED [facility] 01/18/14 1539 SHIPMAN M   Report Status 01/18/2014 FINAL   Final     Studies:  Recent x-ray studies have been reviewed in detail by the Attending Physician  Scheduled Meds:  Scheduled Meds: . docusate sodium  100 mg Oral BID  . enoxaparin (LOVENOX) injection  30 mg Subcutaneous Q24H  . feeding supplement (GLUCERNA SHAKE)  237 mL Oral TID BM  . gabapentin  100 mg Oral QHS  . glipiZIDE  5 mg Oral Q breakfast  . guaiFENesin  600 mg Oral BID  . guaiFENesin-dextromethorphan  5 mL Oral Q4H  . insulin aspart  0-15 Units Subcutaneous TID WC  . insulin aspart  0-5 Units Subcutaneous QHS  . ipratropium  0.5 mg Nebulization Q6H  . levofloxacin (LEVAQUIN) IV  750 mg Intravenous Q48H  . mometasone-formoterol  2 puff Inhalation  BID  . [START ON 01/19/2014] oseltamivir  30 mg Oral Daily  . pantoprazole  40 mg Oral Q1200  . sodium chloride  3 mL Intravenous Q12H  . [START ON 01/20/2014] vancomycin  1,000 mg Intravenous Q48H   Continuous Infusions:   Time spent on care of this patient: >35 min   Debbe Odea, MD  [facility] Office  [phone] Pager - Text Page per Shea Evans as per below:  On-Call/Text Page:      [email or web address]  If 7PM-7AM, please contact night-coverage [email or web address] 01/18/2014, 5:39 PM   LOS: 1 day

## 2014-01-18 NOTE — Telephone Encounter (Signed)
Relevant patient education assigned to patient using Emmi. ° °

## 2014-01-18 NOTE — ED Notes (Signed)
Admitting physician at bedside

## 2014-01-18 NOTE — ED Provider Notes (Signed)
I performed a history and physical examination of  [name] Small and discussed his management with Dr. Karleen Hampshire. I agree with the history, physical, assessment, and plan of care, with the following exceptions: None I was present for the following procedures: None  Time Spent in Critical Care of the patient: None  Time spent in discussions with the patient and family: 15 minutes  Kelvis Berger  Pt comes in w/ cc of dib. Hx of COPD, not on any meds. Seems like his DIB acutely worsened over the past few days. No chest pain, CHF like sx. Wheezing on exam, tachycardic - sinus. CXR clear. Will admit.  COPD exacerbation Hypoxic resp failure SIRs  Varney Biles, MD 01/18/14 [id number]

## 2014-01-18 NOTE — Progress Notes (Signed)
CBG 52, pt encouraged to drink ensure drink.  Recheck CBG 1750 was 37.  Pt given amp of D50, recheck in 20 minutes was 88.  Pt later c/o nausea at 1905, CBG at that time was 62.  Pt given another amp of d50.  Recheck pending, see capillary glucose labs.  MD called, see orders.  Will start D5 IVF at 33.  Carol Ada, RN

## 2014-01-18 NOTE — Progress Notes (Addendum)
ANTIBIOTIC CONSULT NOTE - INITIAL  Pharmacy Consult for Vancomycin and Zosyn; add Levaquin Indication: rule out sepsis; bronchitis  Allergies  Allergen Reactions  . Actos [Pioglitazone Hydrochloride] Other (See Comments)    Held 2012 bladder cancer  . Aspirin Other (See Comments)    Held 2012 due to hematuria and bruising.   . Ace Inhibitors Cough  . Bactrim Rash and Other (See Comments)    Rash, presumed allergy  . Sulfa Drugs Cross Reactors Rash    Patient Measurements: Height: 6\' 2"  (188 cm) Weight: 215 lb (97.523 kg) IBW/kg (Calculated) : 82.2  Vital Signs: Temp: 101.1 F (38.4 C) (03/10 0032) Temp src: Rectal (03/10 0032) BP: 125/62 mmHg (03/10 0105) Pulse Rate: 119 (03/10 0105) Intake/Output from previous day: 03/09 0701 - 03/10 0700 In: 1300 [I.V.:1300] Out: -  Intake/Output from this shift: Total I/O In: 1300 [I.V.:1300] Out: -   Labs:  Recent Labs  01/17/14 2044  WBC 6.6  HGB 15.2  PLT 163  CREATININE 2.08*   Estimated Creatinine Clearance: 31.3 ml/min (by C-G formula based on Cr of 2.08). No results found for this basename: VANCOTROUGH, VANCOPEAK, VANCORANDOM, GENTTROUGH, GENTPEAK, GENTRANDOM, TOBRATROUGH, TOBRAPEAK, TOBRARND, AMIKACINPEAK, AMIKACINTROU, AMIKACIN,  in the last 72 hours   Microbiology: No results found for this or any previous visit (from the past 720 hour(s)).  Medical History: Past Medical History  Diagnosis Date  . BENIGN PROSTATIC HYPERTROPHY 05/21/2007  . DIABETES MELLITUS, TYPE II 05/21/2007  . DIVERTICULOSIS, COLON 05/21/2007    pt denies  . HYPERTENSION 05/21/2007  . PSORIASIS, SCALP 10/25/2008  . Cellulitis of left foot     Hospitalized in 2006  . Gross hematuria     pt denies  . Psoriasis ELBOWS  . Fatigue AGE-RELATED  . Arthritis   . CAD (coronary artery disease) CARDIOLOGIST- DR WALL - VISIT IN JUNE 2012 FOR CATH    nonobstructive by cath 6/12:  mid LAD 30%, proximal obtuse marginal-2 30%, proximal RCA 20%, mid  RCA 30-40%.  He had normal cardiac output and mildly elevated filling pressures but no significant pulmonary hypertension;   Echocardiogram in May 2012 demonstrated EF 50-55% and left atrial enlargement   . Impaired hearing BILATERAL HEARING AIDS  . Bladder cancer     2013  . Chronic kidney disease (CKD), stage III (moderate) 12/04/2007    FOLLOWED BY DR PATEL  . Back pain     s/p lumbar injection 2014    Medications:  Prescriptions prior to admission  Medication Sig Dispense Refill  . acetaminophen (TYLENOL) 500 MG tablet Take 1,000 mg by mouth every 6 (six) hours as needed for moderate pain.      Marland Kitchen amLODipine (NORVASC) 5 MG tablet Take 5 mg by mouth every morning.       . fluocinonide-emollient (LIDEX-E) 0.05 % cream Apply 1 application topically 2 (two) times daily as needed (for skin irritation).      . furosemide (LASIX) 40 MG tablet Take 40 mg by mouth every Monday, Wednesday, and Friday.       . gabapentin (NEURONTIN) 100 MG capsule Take 100 mg by mouth at bedtime.       Marland Kitchen glipiZIDE (GLUCOTROL XL) 5 MG 24 hr tablet Take 5 mg by mouth daily with breakfast.      . guaiFENesin (DIABETIC TUSSIN EX) 100 MG/5ML liquid Take 200 mg by mouth 3 (three) times daily as needed for cough.      . insulin glargine (LANTUS) 100 UNIT/ML injection Inject 30 Units  into the skin daily at 3 pm.        Assessment: 80 yo male with fever/cough/SOB, possible sepsis, for empiric antibiotics  Goal of Therapy:  Vancomycin trough level 15-20 mcg/ml  Plan:  Vancomycin 2 g IV now, then 1 g IV q48h Zosyn 3.375 g IV q8h  Abbott, Bronson Curb 01/18/2014,1:48 AM  Addendum: Pharmacy asked to add levaquin for bronchitis. Will dose levaquin 750mg  IV q48 for CrCl ~ 23ml/min.  Nena Jordan 01/18/2014, 11:18 AM

## 2014-01-18 NOTE — Clinical Documentation Improvement (Signed)
Possible Clinical Conditions?  Acute Respiratory Failure Acute on Chronic Respiratory Failure Other Condition  Supporting Information: Risk Factors:COPD w/exacerbation; longterm smoker Signs & Symptoms:shortness of breath at rest. non-productive cough (H&P); wheezing, rhonchi, rales Diagnostics: RR:29; Pox:88  Treatment: CAT, O2 at 6 l/ml NRB at 15  Thank You, Joya Salm ,RN Clinical Documentation Specialist:  [facility]

## 2014-01-18 NOTE — Progress Notes (Signed)
  Echocardiogram 2D Echocardiogram has been performed.  [name] Small 01/18/2014, 3:19 PM

## 2014-01-19 ENCOUNTER — Telehealth: Payer: Self-pay | Admitting: Family Medicine

## 2014-01-19 DIAGNOSIS — I4891 Unspecified atrial fibrillation: Secondary | ICD-10-CM

## 2014-01-19 DIAGNOSIS — J09X2 Influenza due to identified novel influenza A virus with other respiratory manifestations: Secondary | ICD-10-CM

## 2014-01-19 LAB — GLUCOSE, CAPILLARY
Glucose-Capillary: 101 mg/dL — ABNORMAL HIGH (ref 70–99)
Glucose-Capillary: 125 mg/dL — ABNORMAL HIGH (ref 70–99)
Glucose-Capillary: 129 mg/dL — ABNORMAL HIGH (ref 70–99)
Glucose-Capillary: 70 mg/dL (ref 70–99)
Glucose-Capillary: 74 mg/dL (ref 70–99)
Glucose-Capillary: 88 mg/dL (ref 70–99)
Glucose-Capillary: 99 mg/dL (ref 70–99)

## 2014-01-19 LAB — URINE CULTURE
Colony Count: NO GROWTH
Culture: NO GROWTH
Special Requests: NORMAL

## 2014-01-19 LAB — BASIC METABOLIC PANEL
BUN: 41 mg/dL — ABNORMAL HIGH (ref 6–23)
CO2: 27 mEq/L (ref 19–32)
Calcium: 7.5 mg/dL — ABNORMAL LOW (ref 8.4–10.5)
Chloride: 101 mEq/L (ref 96–112)
Creatinine, Ser: 2.08 mg/dL — ABNORMAL HIGH (ref 0.50–1.35)
GFR calc Af Amer: 32 mL/min — ABNORMAL LOW (ref >90)
GFR calc non Af Amer: 28 mL/min — ABNORMAL LOW (ref >90)
Glucose, Bld: 100 mg/dL — ABNORMAL HIGH (ref 70–99)
Potassium: 4.5 mEq/L (ref 3.7–5.3)
Sodium: 138 mEq/L (ref 137–147)

## 2014-01-19 LAB — TROPONIN I: Troponin I: 0.66 ng/mL (ref <0.30)

## 2014-01-19 MED ORDER — AMLODIPINE BESYLATE 5 MG PO TABS
5.0000 mg | ORAL_TABLET | Freq: Every day | ORAL | Status: DC
  Administered 2014-01-19 – 2014-01-20 (×2): 5 mg via ORAL
  Filled 2014-01-19 (×2): qty 1

## 2014-01-19 NOTE — Telephone Encounter (Signed)
Called his wife.  Sx started after last OV.  He feels some better now.  App help of all involved.

## 2014-01-19 NOTE — Evaluation (Signed)
Physical Therapy Evaluation Patient Details Name: [name] Small MRN: [id number] DOB: [birth year] Today's Date: 01/19/2014 Time: 0950-1010 PT Time Calculation (min): 20 min  PT Assessment / Plan / Recommendation History of Present Illness  Pt admit with respiratory failure.  Pt with positive flu. On DROPLET precautions.    Clinical Impression  Pt admitted with above. Pt currently with functional limitations due to the deficits listed below (see PT Problem List).  QWill benefit from cane and possibly need home O2.  HHPT also recommended. Pt will benefit from skilled PT to increase their independence and safety with mobility to allow discharge to the venue listed below.     PT Assessment  Patient needs continued PT services    Follow Up Recommendations  Home health PT;Supervision/Assistance - 24 hour    Does the patient have the potential to tolerate intense rehabilitation      Barriers to Discharge        Equipment Recommendations  Cane (?home O2)    Recommendations for Other Services     Frequency Min 3X/week    Precautions / Restrictions Precautions Precautions: Fall Restrictions Weight Bearing Restrictions: No   Pertinent Vitals/Pain Desat to 83% on RA with activity.  >90% on 2LO2.  No pain.      Mobility  Bed Mobility Overal bed mobility: Needs Assistance Bed Mobility: Supine to Sit Supine to sit: Min assist General bed mobility comments: Cues for LE movement and assist for elevation of trunk.  Transfers Overall transfer level: Needs assistance Equipment used: None Transfers: Sit to/from Stand Sit to Stand: Min assist;+2 safety/equipment General transfer comment: cues for hand placement Ambulation/Gait Ambulation/Gait assistance: Min assist;+2 safety/equipment Ambulation Distance (Feet): 180 Feet Assistive device: None Gait Pattern/deviations: Step-through pattern Gait velocity interpretation: Below normal speed for age/gender General Gait Details:  Followed pt with chair however pt did not need it.  He ambulated down hall and back to room.  One significant LOB to right needing min asssist tos steady.Informed pt and wife pt may need cane at home to steady.  also pt desat to 83 % on RA.  REplaced O2 after walk and encouraged incentive spirometer.      Exercises General Exercises - Lower Extremity Ankle Circles/Pumps: AROM;Both;10 reps;Seated Long Arc Quad: AROM;Both;10 reps;Seated Hip Flexion/Marching: AROM;Both;10 reps;Seated   PT Diagnosis: Generalized weakness  PT Problem List: Decreased activity tolerance;Decreased balance;Decreased mobility;Decreased knowledge of use of DME;Decreased strength;Decreased safety awareness PT Treatment Interventions: DME instruction;Gait training;Functional mobility training;Therapeutic activities;Balance training;Therapeutic exercise;Patient/family education     PT Goals(Current goals can be found in the care plan section) Acute Rehab PT Goals Patient Stated Goal: to go home PT Goal Formulation: With patient Time For Goal Achievement: 01/26/14 Potential to Achieve Goals: Good  Visit Information  Last PT Received On: 01/19/14 Assistance Needed: +1 History of Present Illness: Pt admit with respiratory failure.  Pt with positive flu. On DROPLET precautions.         Prior [place] expects to be discharged to:: Private residence Living Arrangements: Spouse/significant other Available Help at Discharge: Family;Available PRN/intermittently Type of Home: House Home Access: Stairs to enter [employer] of Steps: 3 Entrance Stairs-Rails: None Home Layout: One level Home Equipment: None Prior Function Level of Independence: Independent Communication Communication: No difficulties Dominant Hand: Left    Cognition  Cognition Arousal/Alertness: Awake/alert Behavior During Therapy: WFL for tasks assessed/performed Overall Cognitive Status: Within  Functional Limits for tasks assessed    Extremity/Trunk Assessment Upper Extremity Assessment Upper Extremity  Assessment: Defer to OT evaluation Lower Extremity Assessment Lower Extremity Assessment: Generalized weakness Cervical / Trunk Assessment Cervical / Trunk Assessment: Normal   Balance Balance Overall balance assessment: Needs assistance;History of Falls Postural control: Right lateral lean Standing balance support: No upper extremity supported;During functional activity Standing balance-Leahy Scale: Fair Standing balance comment: Pt with one LOB to right needing assist with dynamic gait.   End of Session PT - End of Session Equipment Utilized During Treatment: Gait belt;Oxygen Activity Tolerance: Patient limited by fatigue Patient left: in chair;with call bell/phone within reach;with family/visitor present Nurse Communication: Mobility status  GP     INGOLD,Donnie Panik 01/19/2014, 11:46 AM [facility] [phone] (pager)

## 2014-01-19 NOTE — Care Management Note (Addendum)
  Page 1 of 1   01/19/2014     3:58:30 PM   CARE MANAGEMENT NOTE 01/19/2014  Patient:  [name] Small, [name] Small   Account Number:  [id number]  Date Initiated:  01/18/2014  Documentation initiated by:  Elissa Hefty  Subjective/Objective Assessment:   adm w diabetes     Action/Plan:   lives w wife, pcp dr [name]   Anticipated DC Date:  01/20/2014   Anticipated DC Plan:  [place]         Choice offered to / List presented to:  C-1 Patient           Status of service:   Medicare Important Message given?   (If response is "NO", the following Medicare IM given date fields will be blank) Date Medicare IM given:   Date Additional Medicare IM given:    Discharge Disposition:    Per UR Regulation:  Reviewed for med. necessity/level of care/duration of stay  If discussed at [place] of Stay Meetings, dates discussed:    Comments:  01-20-14 Patient and wife have decided on [place] for HHPT. Referral given to [facility] . Magdalen Spatz RN BSN    01-19-14 Spoke with patient and family at bedside regarding home health , confirmed face sheet information . Patient will discuss with wife this evening and decide on agency .  List of home health agencies provided. '  will follow up in morning  Magdalen Spatz RN BSn [phone]

## 2014-01-19 NOTE — Progress Notes (Addendum)
[facility] Progress Note [place] TEAM 1 - Stepdown/ICU TEAM   [name] Small [name] DOB: 01-May-1928 DOA: 01/17/2014 PCP: [name], MD  Brief narrative: [name] Small is a 80 y.o. male presenting on 01/17/2014 with  has a past medical history of BENIGN PROSTATIC HYPERTROPH DIABETES MELLITUS, TYPE II ; DIVERTICULOSIS, COLON; HYPERTENSION; PSORIASIS, SCALP  Cellulitis of left foot; Gross hematuria; Psoriasis (ELBOWS); Fatigue (AGE-RELATED); Arthritis; CAD (coronary artery disease) (CARDIOLOGIST- DR WALL - VISIT IN JUNE 2012 FOR CATH); Impaired hearing (BILATERAL HEARING AIDS); Bladder cancer; Chronic kidney disease (CKD), stage III (moderate) (12/04/2007); and Back pain who presents with cough now productive and noted to be febrile and hypoxic in ER.    Subjective: Continues to feel better. No new complaints.   Assessment/Plan: Principal Problem:   Acute respiratory failure/ Sepsis - Influenza A - cont Tamiflu- stopped antibiotics - tx to floor today and likely home in AM  Active Problems:  Wheezing- no h/o COPD- remote smoker - improved- did not receive any steroids after Prednisone in ER- will hold off on further steroids except inhaled    Diabetes with neurologic complications - increase Lantus to home dose but became hypoglycemic - on D5 now and regular diet- sugars still < 100    Hypotension with h/o HYPERTENSION - home meds on hold    Chronic kidney disease, stage III (moderate) - stable    Atrial fibrillation - sinus rhythm - not on anticoagulation?  Mildly elevated TN I - likely due to resp distress- Troponins only mildly elevated- EKG not significant   Code Status: DNR Family Communication: with wife Disposition Plan: home with HHPT  Consultants: none  Procedures: none  Antibiotics: Antibiotics Given (last 72 hours)   Date/Time Action Medication Dose Rate   01/18/14 0218 Given   vancomycin (VANCOCIN) 2,000 mg in sodium chloride 0.9  % 500 mL IVPB 2,000 mg 250 mL/hr   01/18/14 0223 Given   piperacillin-tazobactam (ZOSYN) IVPB 3.375 g 3.375 g 12.5 mL/hr   01/18/14 0229 Given   oseltamivir (TAMIFLU) capsule 30 mg 30 mg    01/18/14 0949 Given   oseltamivir (TAMIFLU) capsule 30 mg 30 mg    01/18/14 1328 Given   levofloxacin (LEVAQUIN) IVPB 750 mg 750 mg 100 mL/hr   01/19/14 1058 Given   oseltamivir (TAMIFLU) capsule 30 mg 30 mg        DVT prophylaxis: Lovenox  Objective: Filed Weights   01/17/14 1955  Weight: 97.523 kg (215 lb)   Blood pressure 130/57, pulse 63, temperature 97.9 F (36.6 C), temperature source Oral, resp. rate 18, height 6\' 2"  (1.88 m), weight 97.523 kg (215 lb), SpO2 97.00%.  Intake/Output Summary (Last 24 hours) at 01/19/14 1530 Last data filed at 01/19/14 1300  Gross per 24 hour  Intake 1034.17 ml  Output    600 ml  Net 434.17 ml     Exam: General: No acute respiratory distress Lungs: Clear to auscultation bilaterally without wheezes or crackles Cardiovascular: Regular rate and rhythm without murmur gallop or rub normal S1 and S2 Abdomen: Nontender, nondistended, soft, bowel sounds positive, no rebound, no ascites, no appreciable mass Extremities: No significant cyanosis, clubbing, or edema bilateral lower extremities  Data Reviewed: Basic Metabolic Panel:  Recent Labs Lab 01/14/14 1018 01/17/14 2044 01/18/14 0500 01/19/14 0234  NA 140 141 136* 138  K 4.6 4.8 4.9 4.5  CL 96 98 97 101  CO2 33* 31 22 27   GLUCOSE 103* 86 397* 100*  BUN 42* 32*  36* 41*  CREATININE 2.3* 2.08* 2.35* 2.08*  CALCIUM 9.3 8.5 7.4* 7.5*  MG  --   --  1.9  --   PHOS  --   --  3.1  --    Liver Function Tests:  Recent Labs Lab 01/17/14 2044 01/18/14 0500  AST 16 16  ALT 8 7  ALKPHOS 63 49  BILITOT 0.3 0.2*  PROT 7.1 5.6*  ALBUMIN 3.6 2.8*   No results found for this basename: LIPASE, AMYLASE,  in the last 168 hours No results found for this basename: AMMONIA,  in the last 168  hours CBC:  Recent Labs Lab 01/17/14 2044 01/18/14 0730  WBC 6.6 5.9  NEUTROABS 4.7  --   HGB 15.2 13.0  HCT 45.8 40.1  MCV 100.0 101.0*  PLT 163 151   Cardiac Enzymes:  Recent Labs Lab 01/18/14 0500 01/18/14 0730 01/18/14 1415 01/19/14 1105  TROPONINI <0.30 <0.30 0.40* 0.66*   BNP (last 3 results)  Recent Labs  01/17/14 2045  PROBNP 267.3   CBG:  Recent Labs Lab 01/19/14 0041 01/19/14 0402 01/19/14 0623 01/19/14 0833 01/19/14 1128  GLUCAP 125* 99 70 74 88    Recent Results (from the past 240 hour(s))  MRSA PCR SCREENING     Status: None   Collection Time    01/18/14  1:46 AM      Result Value Ref Range Status   MRSA by PCR NEGATIVE  NEGATIVE Final   Comment:            The GeneXpert MRSA Assay (FDA     approved for NASAL specimens     only), is one component of a     comprehensive MRSA colonization     surveillance program. It is not     intended to diagnose MRSA     infection nor to guide or     monitor treatment for     MRSA infections.  CULTURE, BLOOD (ROUTINE X 2)     Status: None   Collection Time    01/18/14  4:35 AM      Result Value Ref Range Status   Specimen Description BLOOD LEFT HAND   Final   Special Requests BOTTLES DRAWN AEROBIC ONLY 5CC   Final   Culture  Setup Time     Final   Value: 01/18/2014 08:51     Performed at [facility]   Culture     Final   Value:        BLOOD CULTURE RECEIVED NO GROWTH TO DATE CULTURE WILL BE HELD FOR 5 DAYS BEFORE ISSUING A FINAL NEGATIVE REPORT     Performed at [facility]   Report Status PENDING   Incomplete  CULTURE, BLOOD (ROUTINE X 2)     Status: None   Collection Time    01/18/14  4:55 AM      Result Value Ref Range Status   Specimen Description BLOOD RIGHT HAND   Final   Special Requests BOTTLES DRAWN AEROBIC ONLY 5CC   Final   Culture  Setup Time     Final   Value: 01/18/2014 08:51     Performed at [facility]   Culture     Final   Value:        BLOOD  CULTURE RECEIVED NO GROWTH TO DATE CULTURE WILL BE HELD FOR 5 DAYS BEFORE ISSUING A FINAL NEGATIVE REPORT     Performed at [facility]   Report  Status PENDING   Incomplete  URINE CULTURE     Status: None   Collection Time    01/18/14  7:19 AM      Result Value Ref Range Status   Specimen Description URINE, CATHETERIZED   Final   Special Requests Normal   Final   Culture  Setup Time     Final   Value: 01/18/2014 12:48     Performed at [facility]     Final   Value: NO GROWTH     Performed at [facility]   Culture     Final   Value: NO GROWTH     Performed at [facility]   Report Status 01/19/2014 FINAL   Final  CULTURE, EXPECTORATED SPUTUM-ASSESSMENT     Status: None   Collection Time    01/18/14  3:07 PM      Result Value Ref Range Status   Specimen Description SPUTUM   Final   Special Requests Normal   Final   Sputum evaluation     Final   Value: MICROSCOPIC FINDINGS SUGGEST THAT THIS SPECIMEN IS NOT REPRESENTATIVE OF LOWER RESPIRATORY SECRETIONS. PLEASE RECOLLECT.     CALLED ERIN [facility] 01/18/14 1539 SHIPMAN M   Report Status 01/18/2014 FINAL   Final     Studies:  Recent x-ray studies have been reviewed in detail by the Attending Physician  Scheduled Meds:  Scheduled Meds: . amLODipine  5 mg Oral Daily  . docusate sodium  100 mg Oral BID  . enoxaparin (LOVENOX) injection  30 mg Subcutaneous Q24H  . feeding supplement (GLUCERNA SHAKE)  237 mL Oral TID BM  . gabapentin  100 mg Oral QHS  . guaiFENesin  600 mg Oral BID  . guaiFENesin-dextromethorphan  5 mL Oral Q4H  . insulin aspart  0-15 Units Subcutaneous TID WC  . insulin aspart  0-5 Units Subcutaneous QHS  . ipratropium  0.5 mg Nebulization Q6H  . mometasone-formoterol  2 puff Inhalation BID  . oseltamivir  30 mg Oral Daily  . pantoprazole  40 mg Oral Q1200  . sodium chloride  3 mL Intravenous Q12H   Continuous Infusions: . dextrose 50 mL/hr at 01/19/14 0132     Time spent on care of this patient: 27 min   Debbe Odea, MD  [facility] Office  [phone] Pager - Text Page per Shea Evans as per below:  On-Call/Text Page:      [email or web address]  If 7PM-7AM, please contact night-coverage [email or web address] 01/19/2014, 3:30 PM   LOS: 2 days

## 2014-01-19 NOTE — Progress Notes (Signed)
Patient transferred to [facility] room 26. O2 on. IVF infusing. No c/o's Oriented to room. Call bell in reach. Family at bedside.

## 2014-01-20 ENCOUNTER — Inpatient Hospital Stay (HOSPITAL_COMMUNITY): Payer: Medicare Other

## 2014-01-20 LAB — GLUCOSE, CAPILLARY
Glucose-Capillary: 135 mg/dL — ABNORMAL HIGH (ref 70–99)
Glucose-Capillary: 150 mg/dL — ABNORMAL HIGH (ref 70–99)
Glucose-Capillary: 176 mg/dL — ABNORMAL HIGH (ref 70–99)

## 2014-01-20 LAB — CBC
HCT: 41.9 % (ref 39.0–52.0)
Hemoglobin: 13.9 g/dL (ref 13.0–17.0)
MCH: 32.9 pg (ref 26.0–34.0)
MCHC: 33.2 g/dL (ref 30.0–36.0)
MCV: 99.1 fL (ref 78.0–100.0)
Platelets: 138 10*3/uL — ABNORMAL LOW (ref 150–400)
RBC: 4.23 MIL/uL (ref 4.22–5.81)
RDW: 12.7 % (ref 11.5–15.5)
WBC: 5.5 10*3/uL (ref 4.0–10.5)

## 2014-01-20 LAB — PROCALCITONIN: Procalcitonin: 1.71 ng/mL

## 2014-01-20 MED ORDER — INSULIN ASPART 100 UNIT/ML ~~LOC~~ SOLN
0.0000 [IU] | Freq: Three times a day (TID) | SUBCUTANEOUS | Status: DC
  Administered 2014-01-20: 2 [IU] via SUBCUTANEOUS
  Administered 2014-01-21: 3 [IU] via SUBCUTANEOUS

## 2014-01-20 MED ORDER — INSULIN ASPART 100 UNIT/ML ~~LOC~~ SOLN: 0.0000 [IU] | Freq: Every day | SUBCUTANEOUS | Status: DC

## 2014-01-20 MED ORDER — SIMETHICONE 80 MG PO CHEW
80.0000 mg | CHEWABLE_TABLET | Freq: Four times a day (QID) | ORAL | Status: DC
  Administered 2014-01-20 – 2014-01-21 (×4): 80 mg via ORAL
  Filled 2014-01-20 (×7): qty 1

## 2014-01-20 NOTE — Progress Notes (Signed)
[facility] Progress Note [place] TEAM 1 - Stepdown/ICU TEAM   [name] Small [id number] DOB: September 18, 1928 DOA: 01/17/2014 PCP: [name], MD  Brief narrative: [name] Small is a 80 y.o. male presenting on 01/17/2014 with  has a past medical history of BENIGN PROSTATIC HYPERTROPH DIABETES MELLITUS, TYPE II ; DIVERTICULOSIS, COLON; HYPERTENSION; PSORIASIS, SCALP  Cellulitis of left foot; Gross hematuria; Psoriasis (ELBOWS); Fatigue (AGE-RELATED); Arthritis; CAD (coronary artery disease) (CARDIOLOGIST- DR WALL - VISIT IN JUNE 2012 FOR CATH); Impaired hearing (BILATERAL HEARING AIDS); Bladder cancer; Chronic kidney disease (CKD), stage III (moderate) (12/04/2007); and Back pain who presents with cough now productive and noted to be febrile and hypoxic in ER.    Subjective: States he feels the same as yesterday.   Assessment/Plan: Principal Problem:   Acute respiratory failure/ Sepsis - Influenza A - cont Tamiflu- stopped antibiotics - more wheezing today along with new crackles in lungs- obtain CXR and start flutter valve - cont xopenex  Active Problems:  Wheezing- no h/o COPD- remote smoker - improved- did not receive any steroids after Prednisone in ER- will hold off on further steroids except inhaled    Diabetes with neurologic complications - increased Lantus to home dose but became hypoglycemic - given D5 - will stop now- cont regular diet as he is barely eating    Hypotension with h/o HYPERTENSION - home meds on hold    Chronic kidney disease, stage III (moderate) - stable    Atrial fibrillation - sinus rhythm - not on anticoagulation?  Mildly elevated TN I - likely due to resp distress- Troponins only mildly elevated- EKG not significant   Code Status: DNR Family Communication: with wife Disposition Plan: home with HHPT  Consultants: none  Procedures: none  Antibiotics: Antibiotics Given (last 72 hours)   Date/Time Action Medication Dose Rate    01/18/14 0218 Given   vancomycin (VANCOCIN) 2,000 mg in sodium chloride 0.9 % 500 mL IVPB 2,000 mg 250 mL/hr   01/18/14 0223 Given   piperacillin-tazobactam (ZOSYN) IVPB 3.375 g 3.375 g 12.5 mL/hr   01/18/14 0229 Given   oseltamivir (TAMIFLU) capsule 30 mg 30 mg    01/18/14 0949 Given   oseltamivir (TAMIFLU) capsule 30 mg 30 mg    01/18/14 1328 Given   levofloxacin (LEVAQUIN) IVPB 750 mg 750 mg 100 mL/hr   01/19/14 1058 Given   oseltamivir (TAMIFLU) capsule 30 mg 30 mg    01/20/14 1011 Given   oseltamivir (TAMIFLU) capsule 30 mg 30 mg        DVT prophylaxis: Lovenox  Objective: Filed Weights   01/17/14 1955  Weight: 97.523 kg (215 lb)   Blood pressure 116/54, pulse 64, temperature 97.4 F (36.3 C), temperature source Oral, resp. rate 20, height 6\' 2"  (1.88 m), weight 97.523 kg (215 lb), SpO2 95.00%.  Intake/Output Summary (Last 24 hours) at 01/20/14 1441 Last data filed at 01/20/14 0730  Gross per 24 hour  Intake    950 ml  Output    975 ml  Net    -25 ml     Exam: General: AAO x 3 No acute respiratory distress Lungs: upper airway wheeze and b/l crackles- 3 L O2, pulse ox 95% Cardiovascular: Regular rate and rhythm without murmur gallop or rub normal S1 and S2 Abdomen: Nontender, nondistended, soft, bowel sounds positive, no rebound, no ascites, no appreciable mass Extremities: No significant cyanosis, clubbing, or edema bilateral lower extremities  Data Reviewed: Basic Metabolic Panel:  Recent Labs Lab 01/14/14  1018 01/17/14 2044 01/18/14 0500 01/19/14 0234  NA 140 141 136* 138  K 4.6 4.8 4.9 4.5  CL 96 98 97 101  CO2 33* 31 22 27   GLUCOSE 103* 86 397* 100*  BUN 42* 32* 36* 41*  CREATININE 2.3* 2.08* 2.35* 2.08*  CALCIUM 9.3 8.5 7.4* 7.5*  MG  --   --  1.9  --   PHOS  --   --  3.1  --    Liver Function Tests:  Recent Labs Lab 01/17/14 2044 01/18/14 0500  AST 16 16  ALT 8 7  ALKPHOS 63 49  BILITOT 0.3 0.2*  PROT 7.1 5.6*  ALBUMIN 3.6 2.8*    No results found for this basename: LIPASE, AMYLASE,  in the last 168 hours No results found for this basename: AMMONIA,  in the last 168 hours CBC:  Recent Labs Lab 01/17/14 2044 01/18/14 0730 01/20/14 0715  WBC 6.6 5.9 5.5  NEUTROABS 4.7  --   --   HGB 15.2 13.0 13.9  HCT 45.8 40.1 41.9  MCV 100.0 101.0* 99.1  PLT 163 151 138*   Cardiac Enzymes:  Recent Labs Lab 01/18/14 0500 01/18/14 0730 01/18/14 1415 01/19/14 1105  TROPONINI <0.30 <0.30 0.40* 0.66*   BNP (last 3 results)  Recent Labs  01/17/14 2045  PROBNP 267.3   CBG:  Recent Labs Lab 01/19/14 1128 01/19/14 1725 01/19/14 2157 01/20/14 0738 01/20/14 1150  GLUCAP 88 101* 129* 135* 150*    Recent Results (from the past 240 hour(s))  MRSA PCR SCREENING     Status: None   Collection Time    01/18/14  1:46 AM      Result Value Ref Range Status   MRSA by PCR NEGATIVE  NEGATIVE Final   Comment:            The GeneXpert MRSA Assay (FDA     approved for NASAL specimens     only), is one component of a     comprehensive MRSA colonization     surveillance program. It is not     intended to diagnose MRSA     infection nor to guide or     monitor treatment for     MRSA infections.  CULTURE, BLOOD (ROUTINE X 2)     Status: None   Collection Time    01/18/14  4:35 AM      Result Value Ref Range Status   Specimen Description BLOOD LEFT HAND   Final   Special Requests BOTTLES DRAWN AEROBIC ONLY 5CC   Final   Culture  Setup Time     Final   Value: 01/18/2014 08:51     Performed at [facility]   Culture     Final   Value:        BLOOD CULTURE RECEIVED NO GROWTH TO DATE CULTURE WILL BE HELD FOR 5 DAYS BEFORE ISSUING A FINAL NEGATIVE REPORT     Performed at [facility]   Report Status PENDING   Incomplete  CULTURE, BLOOD (ROUTINE X 2)     Status: None   Collection Time    01/18/14  4:55 AM      Result Value Ref Range Status   Specimen Description BLOOD RIGHT HAND   Final   Special  Requests BOTTLES DRAWN AEROBIC ONLY 5CC   Final   Culture  Setup Time     Final   Value: 01/18/2014 08:51     Performed at [facility]   Culture     Final   Value:        BLOOD CULTURE RECEIVED NO GROWTH TO DATE CULTURE WILL BE HELD FOR 5 DAYS BEFORE ISSUING A FINAL NEGATIVE REPORT     Performed at [facility]   Report Status PENDING   Incomplete  URINE CULTURE     Status: None   Collection Time    01/18/14  7:19 AM      Result Value Ref Range Status   Specimen Description URINE, CATHETERIZED   Final   Special Requests Normal   Final   Culture  Setup Time     Final   Value: 01/18/2014 12:48     Performed at [place]     Final   Value: NO GROWTH     Performed at [facility]   Culture     Final   Value: NO GROWTH     Performed at [facility]   Report Status 01/19/2014 FINAL   Final  CULTURE, EXPECTORATED SPUTUM-ASSESSMENT     Status: None   Collection Time    01/18/14  3:07 PM      Result Value Ref Range Status   Specimen Description SPUTUM   Final   Special Requests Normal   Final   Sputum evaluation     Final   Value: MICROSCOPIC FINDINGS SUGGEST THAT THIS SPECIMEN IS NOT REPRESENTATIVE OF LOWER RESPIRATORY SECRETIONS. PLEASE RECOLLECT.     CALLED ERIN [facility] 01/18/14 1539 SHIPMAN M   Report Status 01/18/2014 FINAL   Final     Studies:  Recent x-ray studies have been reviewed in detail by the Attending Physician  Scheduled Meds:  Scheduled Meds: . docusate sodium  100 mg Oral BID  . enoxaparin (LOVENOX) injection  30 mg Subcutaneous Q24H  . feeding supplement (GLUCERNA SHAKE)  237 mL Oral TID BM  . gabapentin  100 mg Oral QHS  . guaiFENesin  600 mg Oral BID  . guaiFENesin-dextromethorphan  5 mL Oral Q4H  . insulin aspart  0-15 Units Subcutaneous TID WC  . insulin aspart  0-5 Units Subcutaneous QHS  . ipratropium  0.5 mg Nebulization Q6H  . mometasone-formoterol  2 puff Inhalation BID  . oseltamivir   30 mg Oral Daily  . pantoprazole  40 mg Oral Q1200  . simethicone  80 mg Oral QID  . sodium chloride  3 mL Intravenous Q12H   Continuous Infusions: . dextrose 50 mL/hr at 01/19/14 0132    Time spent on care of this patient: 25 min   Debbe Odea, MD  [facility] Office  [phone] Pager - Text Page per Shea Evans as per below:  On-Call/Text Page:      [email or web address]  If 7PM-7AM, please contact night-coverage [email or web address] 01/20/2014, 2:41 PM   LOS: 3 days

## 2014-01-21 ENCOUNTER — Telehealth: Payer: Self-pay

## 2014-01-21 DIAGNOSIS — J9600 Acute respiratory failure, unspecified whether with hypoxia or hypercapnia: Secondary | ICD-10-CM

## 2014-01-21 DIAGNOSIS — E1149 Type 2 diabetes mellitus with other diabetic neurological complication: Secondary | ICD-10-CM

## 2014-01-21 DIAGNOSIS — I5032 Chronic diastolic (congestive) heart failure: Secondary | ICD-10-CM | POA: Diagnosis present

## 2014-01-21 DIAGNOSIS — J09X2 Influenza due to identified novel influenza A virus with other respiratory manifestations: Secondary | ICD-10-CM

## 2014-01-21 DIAGNOSIS — I5033 Acute on chronic diastolic (congestive) heart failure: Secondary | ICD-10-CM | POA: Diagnosis present

## 2014-01-21 LAB — BASIC METABOLIC PANEL
BUN: 31 mg/dL — ABNORMAL HIGH (ref 6–23)
CO2: 27 mEq/L (ref 19–32)
Calcium: 7.8 mg/dL — ABNORMAL LOW (ref 8.4–10.5)
Chloride: 98 mEq/L (ref 96–112)
Creatinine, Ser: 1.82 mg/dL — ABNORMAL HIGH (ref 0.50–1.35)
GFR calc Af Amer: 38 mL/min — ABNORMAL LOW (ref >90)
GFR calc non Af Amer: 33 mL/min — ABNORMAL LOW (ref >90)
Glucose, Bld: 160 mg/dL — ABNORMAL HIGH (ref 70–99)
Potassium: 4.4 mEq/L (ref 3.7–5.3)
Sodium: 139 mEq/L (ref 137–147)

## 2014-01-21 LAB — GLUCOSE, CAPILLARY
Glucose-Capillary: 171 mg/dL — ABNORMAL HIGH (ref 70–99)
Glucose-Capillary: 229 mg/dL — ABNORMAL HIGH (ref 70–99)

## 2014-01-21 MED ORDER — MOMETASONE FURO-FORMOTEROL FUM 100-5 MCG/ACT IN AERO: 2.0000 | INHALATION_SPRAY | Freq: Two times a day (BID) | RESPIRATORY_TRACT | Status: DC

## 2014-01-21 MED ORDER — BUDESONIDE-FORMOTEROL FUMARATE 160-4.5 MCG/ACT IN AERO: 2.0000 | INHALATION_SPRAY | Freq: Two times a day (BID) | RESPIRATORY_TRACT | Status: DC

## 2014-01-21 MED ORDER — GLUCERNA SHAKE PO LIQD: 237.0000 mL | Freq: Three times a day (TID) | ORAL | Status: DC

## 2014-01-21 MED ORDER — GLIPIZIDE ER 5 MG PO TB24
5.0000 mg | ORAL_TABLET | Freq: Every day | ORAL | Status: DC
  Administered 2014-01-21: 5 mg via ORAL
  Filled 2014-01-21 (×3): qty 1

## 2014-01-21 MED ORDER — OSELTAMIVIR PHOSPHATE 30 MG PO CAPS: 30.0000 mg | ORAL_CAPSULE | Freq: Every day | ORAL | Status: DC

## 2014-01-21 MED ORDER — GUAIFENESIN-DM 100-10 MG/5ML PO SYRP: 5.0000 mL | ORAL_SOLUTION | ORAL | Status: DC

## 2014-01-21 MED ORDER — INSULIN GLARGINE 100 UNIT/ML ~~LOC~~ SOLN: 20.0000 [IU] | Freq: Every day | SUBCUTANEOUS | Status: DC

## 2014-01-21 MED ORDER — DSS 100 MG PO CAPS: 100.0000 mg | ORAL_CAPSULE | Freq: Two times a day (BID) | ORAL | Status: DC

## 2014-01-21 MED ORDER — SIMETHICONE 80 MG PO CHEW: 80.0000 mg | CHEWABLE_TABLET | Freq: Four times a day (QID) | ORAL | Status: DC | PRN

## 2014-01-21 NOTE — Telephone Encounter (Signed)
[facility] left v/m (no name left) stating pt was put on Dulera while in hospital and pts insurance will not cover [facility] and  Pharmacy wants to know if PCP would substitute  albuterol inhaler or symbicort which is covered by pts insurance.Please advise.

## 2014-01-21 NOTE — Progress Notes (Signed)
Discharge instructions explained to pt. And wife.  Both verbalized understanding of all orders. IV removed.  Prescriptions given to pt.  Pt. Sent home in stable condition with wife. Syliva Overman

## 2014-01-21 NOTE — Discharge Summary (Addendum)
Physician Discharge Summary  [name] Small [id number] DOB: Feb 03, 1928 DOA: 01/17/2014  PCP: [name], MD  Admit date: 01/17/2014 Discharge date: 01/21/2014  Time spent: >35 minutes  Recommendations for Outpatient Follow-up:  1. Pulse ox in 1 wk  Discharge Diagnoses:  Principal Problem:   Acute respiratory failure Active Problems:   Influenza due to identified novel influenza A virus with other respiratory manifestations   Diabetes with neurologic complications   Hypotension, unspecified   Chronic kidney disease, stage III (moderate)   Atrial fibrillation   Sepsis   Chronic diastolic CHF (congestive heart failure)   Discharge Condition: stable  Diet recommendation: heart healthy and diabetic  Filed Weights   01/17/14 1955  Weight: 97.523 kg (215 lb)    History of present illness:  [name] Small is a 80 y.o. male  has a past medical history of BENIGN PROSTATIC HYPERTROPHY (05/21/2007); DIABETES MELLITUS, TYPE II (05/21/2007); DIVERTICULOSIS, COLON (05/21/2007); HYPERTENSION (05/21/2007); PSORIASIS, SCALP (10/25/2008); Cellulitis of left foot; Gross hematuria; Psoriasis (ELBOWS); Fatigue (AGE-RELATED); Arthritis; CAD (coronary artery disease) (CARDIOLOGIST- DR WALL - VISIT IN JUNE 2012 FOR CATH); Impaired hearing (BILATERAL HEARING AIDS); Bladder cancer; Chronic kidney disease (CKD), stage III (moderate) (12/04/2007); and Back pain.  Presented with  Sudden onset of , coughing but not productive, denies any fever no chills at home but found febrile up to 101.2 in ER, reports shortness of breath, diffuse body aches. Denies any nausea vomiting or diarrhea no chest pain.His wife states she had some allergies recently but denies other sick contacts. He presented to ER and was found to be hypoxic requiring 4L of oxygen. He was given Albuterol and became tachycardic but was also noted to be febrile at the same time.   Hospital Course:  Subjective:  No complaints- feeling well,  anxious to go home. Eating is better but not back to his baseline.   Assessment/Plan:  Principal Problem:  Acute respiratory failure/ Sepsis  - Influenza A  - cont Tamiflu x 5 days - stopped antibiotics after 1 dose on 3/10 with no recurrence of fevers -  crackles in right lower/mid lung field noted on 3/12 and repeat CXR only revealed atelectasis in RLL  -  noted to be congested as well on 3/12 and started flutter valve in addition to IS which was already in place - continued xopenex - congestion resolved-  - although pt has been ambulating in the halls without symptoms of respiratory distress, I am unable to wean off of O2- on 2 L - pulse ox 87-88 % at rest when completely off O2 - at this point suspect it is related to atelectasis and may fluid overload although CXR did not reveal this- suspect, may be able to wean O2 once atelectasis resolves therefore, will d/c home with O2 and f/u with PCP in 1 wk  Active Problems:  Wheezing- no h/o COPD- remote smoker  - improved- did not receive any steroids after Prednisone in ER- have held further steroids except inhaled  - questioned last year by Pulm to possibly have COPD- may need to return to Pulm if unable to wean off of O2 - will d/c with [facility] for now  Diabetes with neurologic complications  - increased Lantus to home dose but became hypoglycemic  -resolved- will place of 20 u Lantus for now  Hypotension with h/o HYPERTENSION  - will resume home meds on discharge as this has resolved  Chronic kidney disease, stage III (moderate)  - stable   Atrial fibrillation  -  sinus rhythm  - not on anticoagulation?   Mildly elevated TN I  - likely due to resp distress- Troponins only mildly elevated- EKG not significant and no chest pain  Moderate Protein calorie malnutrition - improving slowly- was on glucerna shakes while here   Procedures: ECHO 3/10  - Left ventricle: Systolic function was normal. The estimated ejection fraction was  in the range of 50% to 55%. Regional wall motion abnormalities cannot be excluded. Doppler parameters are consistent with abnormal left ventricular relaxation (grade 1 diastolic dysfunction). - Right ventricle: The cavity size was mildly dilated. - Right atrium: The atrium was mildly dilated.    Consultations:  None    Discharge Exam: Filed Vitals:   01/21/14 0609  BP: 126/65  Pulse: 63  Temp: 97.9 F (36.6 C)  Resp: 16    General: AAO x 3, no distress Cardiovascular: RRR, no murmurs Respiratory: Mild crackles in RLL, no wheeze or ronchi-   Discharge Instructions      Discharge Orders   Future Appointments Provider Department Dept Phone   04/18/2014 11:30 AM Tonia Ghent, MD [place] at [facility] [phone]   Future Orders Complete By Expires   Diet - low sodium heart healthy  As directed    Comments:     Carb modified   Diet - low sodium heart healthy  As directed    Discharge instructions  As directed    Comments:     Please see your family doctor in 1 wk and have oxygen level checked   Increase activity slowly  As directed    Increase activity slowly  As directed        Medication List    STOP taking these medications       acetaminophen 500 MG tablet  Commonly known as:  TYLENOL      TAKE these medications       amLODipine 5 MG tablet  Commonly known as:  NORVASC  Take 5 mg by mouth every morning.     DIABETIC TUSSIN EX 100 MG/5ML liquid  Generic drug:  guaiFENesin  Take 200 mg by mouth 3 (three) times daily as needed for cough.     DSS 100 MG Caps  Take 100 mg by mouth 2 (two) times daily.     feeding supplement (GLUCERNA SHAKE) Liqd  Take 237 mLs by mouth 3 (three) times daily between meals.     fluocinonide-emollient 0.05 % cream  Commonly known as:  LIDEX-E  Apply 1 application topically 2 (two) times daily as needed (for skin irritation).     furosemide 40 MG tablet  Commonly known as:  LASIX  Take 40 mg by mouth  every Monday, Wednesday, and Friday.     gabapentin 100 MG capsule  Commonly known as:  NEURONTIN  Take 100 mg by mouth at bedtime.     glipiZIDE 5 MG 24 hr tablet  Commonly known as:  GLUCOTROL XL  Take 5 mg by mouth daily with breakfast.     guaiFENesin-dextromethorphan 100-10 MG/5ML syrup  Commonly known as:  ROBITUSSIN DM  Take 5 mLs by mouth every 4 (four) hours.     insulin glargine 100 UNIT/ML injection  Commonly known as:  LANTUS  Inject 0.2 mLs (20 Units total) into the skin daily at 3 pm.     mometasone-formoterol 100-5 MCG/ACT Aero  Commonly known as:  DULERA  Inhale 2 puffs into the lungs 2 (two) times daily.     oseltamivir 30  MG capsule  Commonly known as:  TAMIFLU  Take 1 capsule (30 mg total) by mouth daily.  Start taking on:  01/22/2014     simethicone 80 MG chewable tablet  Commonly known as:  MYLICON  Chew 1 tablet (80 mg total) by mouth 4 (four) times daily as needed for flatulence.       Allergies  Allergen Reactions  . Actos [Pioglitazone Hydrochloride] Other (See Comments)    Held 2012 bladder cancer  . Aspirin Other (See Comments)    Held 2012 due to hematuria and bruising.   . Ace Inhibitors Cough  . Bactrim Rash and Other (See Comments)    Rash, presumed allergy  . Sulfa Drugs Cross Reactors Rash      The results of significant diagnostics from this hospitalization (including imaging, microbiology, ancillary and laboratory) are listed below for reference.    Significant Diagnostic Studies: Dg Chest 2 View  01/20/2014   CLINICAL DATA:  Hypoxia and cough, history of diabetes  EXAM: CHEST  2 VIEW  COMPARISON:  DG CHEST 2 VIEW dated 01/17/2014; DG CHEST 2 VIEW dated 01/06/2013  FINDINGS: The lungs are well-expanded. The interstitial markings have become more conspicuous bilaterally especially in the mid and lower lung zones since the earlier study. There is density in the posterior lateral aspect of the right lower lobe consistent with atelectasis.  The cardiac silhouette is normal in size. The pulmonary vascularity is not clearly engorged. No significant pleural effusion is demonstrated. The observed portions of the bony thorax exhibit no acute abnormalities.  IMPRESSION: New subsegmental atelectasis is present predominantly on the right. Classic alveolar pneumonia is not demonstrated and there is no definite evidence of CHF. Further evaluation with chest CT scanning to exclude possible pulmonary embolism is recommended.   Electronically Signed   By: David  Martinique   On: 01/20/2014 17:15   Dg Chest 2 View  01/17/2014   CLINICAL DATA:  Shortness of breath and cough  EXAM: CHEST  2 VIEW  COMPARISON:  01/06/2013  FINDINGS: The heart size and mediastinal contours are within normal limits. Both lungs are clear. The visualized skeletal structures are unremarkable.  IMPRESSION: No active cardiopulmonary disease.   Electronically Signed   By: Inez Catalina M.D.   On: 01/17/2014 21:39    Microbiology: Recent Results (from the past 240 hour(s))  MRSA PCR SCREENING     Status: None   Collection Time    01/18/14  1:46 AM      Result Value Ref Range Status   MRSA by PCR NEGATIVE  NEGATIVE Final   Comment:            The GeneXpert MRSA Assay (FDA     approved for NASAL specimens     only), is one component of a     comprehensive MRSA colonization     surveillance program. It is not     intended to diagnose MRSA     infection nor to guide or     monitor treatment for     MRSA infections.  CULTURE, BLOOD (ROUTINE X 2)     Status: None   Collection Time    01/18/14  4:35 AM      Result Value Ref Range Status   Specimen Description BLOOD LEFT HAND   Final   Special Requests BOTTLES DRAWN AEROBIC ONLY 5CC   Final   Culture  Setup Time     Final   Value: 01/18/2014 08:51  Performed at [facility]     Final   Value:        BLOOD CULTURE RECEIVED NO GROWTH TO DATE CULTURE WILL BE HELD FOR 5 DAYS BEFORE ISSUING A FINAL NEGATIVE  REPORT     Performed at [facility]   Report Status PENDING   Incomplete  CULTURE, BLOOD (ROUTINE X 2)     Status: None   Collection Time    01/18/14  4:55 AM      Result Value Ref Range Status   Specimen Description BLOOD RIGHT HAND   Final   Special Requests BOTTLES DRAWN AEROBIC ONLY 5CC   Final   Culture  Setup Time     Final   Value: 01/18/2014 08:51     Performed at [facility]   Culture     Final   Value:        BLOOD CULTURE RECEIVED NO GROWTH TO DATE CULTURE WILL BE HELD FOR 5 DAYS BEFORE ISSUING A FINAL NEGATIVE REPORT     Performed at [facility]   Report Status PENDING   Incomplete  URINE CULTURE     Status: None   Collection Time    01/18/14  7:19 AM      Result Value Ref Range Status   Specimen Description URINE, CATHETERIZED   Final   Special Requests Normal   Final   Culture  Setup Time     Final   Value: 01/18/2014 12:48     Performed at [place]     Final   Value: NO GROWTH     Performed at [facility]   Culture     Final   Value: NO GROWTH     Performed at [facility]   Report Status 01/19/2014 FINAL   Final  CULTURE, EXPECTORATED SPUTUM-ASSESSMENT     Status: None   Collection Time    01/18/14  3:07 PM      Result Value Ref Range Status   Specimen Description SPUTUM   Final   Special Requests Normal   Final   Sputum evaluation     Final   Value: MICROSCOPIC FINDINGS SUGGEST THAT THIS SPECIMEN IS NOT REPRESENTATIVE OF LOWER RESPIRATORY SECRETIONS. PLEASE RECOLLECT.     CALLED ERIN [facility] 01/18/14 1539 SHIPMAN M   Report Status 01/18/2014 FINAL   Final     Labs: Basic Metabolic Panel:  Recent Labs Lab 01/17/14 2044 01/18/14 0500 01/19/14 0234 01/21/14 0340  NA 141 136* 138 139  K 4.8 4.9 4.5 4.4  CL 98 97 101 98  CO2 31 22 27 27   GLUCOSE 86 397* 100* 160*  BUN 32* 36* 41* 31*  CREATININE 2.08* 2.35* 2.08* 1.82*  CALCIUM 8.5 7.4* 7.5* 7.8*  MG  --  1.9  --   --    PHOS  --  3.1  --   --    Liver Function Tests:  Recent Labs Lab 01/17/14 2044 01/18/14 0500  AST 16 16  ALT 8 7  ALKPHOS 63 49  BILITOT 0.3 0.2*  PROT 7.1 5.6*  ALBUMIN 3.6 2.8*   No results found for this basename: LIPASE, AMYLASE,  in the last 168 hours No results found for this basename: AMMONIA,  in the last 168 hours CBC:  Recent Labs Lab 01/17/14 2044 01/18/14 0730 01/20/14 0715  WBC 6.6 5.9 5.5  NEUTROABS 4.7  --   --  HGB 15.2 13.0 13.9  HCT 45.8 40.1 41.9  MCV 100.0 101.0* 99.1  PLT 163 151 138*   Cardiac Enzymes:  Recent Labs Lab 01/18/14 0500 01/18/14 0730 01/18/14 1415 01/19/14 1105  TROPONINI <0.30 <0.30 0.40* 0.66*   BNP: BNP (last 3 results)  Recent Labs  01/17/14 2045  PROBNP 267.3   CBG:  Recent Labs Lab 01/20/14 0738 01/20/14 1150 01/20/14 1748 01/20/14 2252 01/21/14 0736  GLUCAP 135* 150* 176* 171* 229*       SignedDebbe Odea, MD  [facility] 01/21/2014, 11:16 AM

## 2014-01-21 NOTE — Telephone Encounter (Signed)
Okay to change, sent.  Thanks.

## 2014-01-21 NOTE — Progress Notes (Addendum)
Physical Therapy Treatment Patient Details Name: [name] Small MRN: [id number] DOB: Sep 09, 1928 Today's Date: 01/21/2014 Time: 2336-1224 PT Time Calculation (min): 24 min  PT Assessment / Plan / Recommendation  History of Present Illness Pt admit with respiratory failure.  Pt with positive flu. On DROPLET precautions.     PT Comments   Pt moving very well today and maintaining O2 89-92% on 2L throughout but almost immediate drop to 87% on RA at rest. Pt educated for HEP and need to continue to use cane. Will follow acutely to maximize balance and activity tolerance as pt desires to return to golf but no longer feel HHPT will be needed. Recommend daily ambulation with nursing. Pt educated for and performed IS x 12  Follow Up Recommendations  No PT follow up;Supervision - Intermittent     Does the patient have the potential to tolerate intense rehabilitation     Barriers to Discharge        Equipment Recommendations  Cane    Recommendations for Other Services    Frequency     Progress towards PT Goals Progress towards PT goals: Goals met and updated - see care plan  Plan Discharge plan needs to be updated    Precautions / Restrictions Precautions Precautions: Fall Restrictions Weight Bearing Restrictions: No   Pertinent Vitals/Pain No pain sats 89-92% on 2L    Mobility  Bed Mobility Overal bed mobility: Modified Independent Transfers Overall transfer level: Modified independent Ambulation/Gait Ambulation/Gait assistance: Supervision Ambulation Distance (Feet): 400 Feet Assistive device: Straight cane Gait Pattern/deviations: WFL(Within Functional Limits) Gait velocity interpretation: at or above normal speed for age/gender General Gait Details: no LOB good use of cane and cues only for breathing technique and directional cues Stairs: Yes Stairs assistance: Modified independent (Device/Increase time) Stair Management: One rail Left;Forwards;Alternating pattern Number  of Stairs: 3    Exercises General Exercises - Lower Extremity Long Arc Quad: AROM;Both;Seated;20 reps Hip ABduction/ADduction: AROM;Seated;Both;20 reps Hip Flexion/Marching: AROM;Both;Seated;20 reps Toe Raises: AROM;Seated;Both;20 reps Heel Raises: AROM;Seated;Both;20 reps   PT Diagnosis:    PT Problem List:   PT Treatment Interventions:     PT Goals (current goals can now be found in the care plan section)    Visit Information  Last PT Received On: 01/21/14 Assistance Needed: +1 History of Present Illness: Pt admit with respiratory failure.  Pt with positive flu. On DROPLET precautions.      Subjective Data      Cognition  Cognition Arousal/Alertness: Awake/alert Behavior During Therapy: WFL for tasks assessed/performed Overall Cognitive Status: Within Functional Limits for tasks assessed    Balance     End of Session PT - End of Session Equipment Utilized During Treatment: Oxygen;Gait belt Activity Tolerance: Patient tolerated treatment well Patient left: in chair;with call bell/phone within reach;with family/visitor present Nurse Communication: Mobility status   GP     Melford Aase 01/21/2014, [place], [place]

## 2014-01-21 NOTE — Progress Notes (Signed)
Pulse Ox w/ambulation for Dr. Wynelle Small: Patient was on 3L O2 while lying in bed.  After removing nasal cannula sats were 93% on room air. Upon ambulation O2 sats dropped to 80% on room air.  Nasal Cannula reapplied (3L) and O2 sats returned to lower 90's.  Patient experienced some dyspnea on exertion. Now resting comfortably. Will continue to monitor.

## 2014-01-21 NOTE — Progress Notes (Signed)
SATURATION QUALIFICATIONS: (This note is used to comply with regulatory documentation for home oxygen)  Patient Saturations on Room Air at Rest = 87%  Patient Saturations on Room Air while Ambulating = NA  Patient Saturations on 2 Liters of oxygen while Ambulating = 89-92%  Please briefly explain why patient needs home oxygen: pt with desaturation on RA at rest and with activity in need of supplemental O2 [name] Small, [name]

## 2014-01-21 NOTE — Telephone Encounter (Signed)
Pharmacy advised.  Pharmacy is contacting patient.

## 2014-01-24 LAB — CULTURE, BLOOD (ROUTINE X 2)
Culture: NO GROWTH
Culture: NO GROWTH

## 2014-01-28 ENCOUNTER — Encounter: Payer: Self-pay | Admitting: Family Medicine

## 2014-01-28 ENCOUNTER — Ambulatory Visit (INDEPENDENT_AMBULATORY_CARE_PROVIDER_SITE_OTHER): Payer: Medicare Other | Admitting: Family Medicine

## 2014-01-28 VITALS — BP 112/56 | HR 70 | Temp 97.4°F | Wt 215.5 lb

## 2014-01-28 DIAGNOSIS — J09X2 Influenza due to identified novel influenza A virus with other respiratory manifestations: Secondary | ICD-10-CM

## 2014-01-28 NOTE — Assessment & Plan Note (Signed)
Much improved, with O2 requirement now.   O2 sat 95% on 2L at rest, 91% RA at rest, 87% RA walking.  Would use O2 for about 10 more days, then rest with HH at home.  If improved, likely stop O2.  D/w pt.  He agrees. Nontoxic, f/u prn.

## 2014-01-28 NOTE — Progress Notes (Signed)
Pre visit review using our clinic review tool, if applicable. No additional management support is needed unless otherwise documented below in the visit note.  Admitted with flu, now with O2 requirement.  HH O2 notes reviewed.  No fevers, minimal cough, compliant with O2. Feels much better.  Done with tamiflu.    Sugar controlled, <100 this AM at home. Compliant with meds.    Meds, vitals, and allergies reviewed.   ROS: See HPI.  Otherwise, noncontributory.  nad ncat Mmm Sounds to be RRR Ctab, no focal dec in BS abd soft Ext w/o edema  O2 sat 95% on 2L at rest, 91% RA at rest, 87% RA walking.

## 2014-01-28 NOTE — Patient Instructions (Signed)
Keep using the oxygen for now and I'll await the update in about 10 days.  Take care. Glad to see you.

## 2014-02-06 DIAGNOSIS — IMO0001 Reserved for inherently not codable concepts without codable children: Secondary | ICD-10-CM

## 2014-02-06 DIAGNOSIS — I509 Heart failure, unspecified: Secondary | ICD-10-CM

## 2014-02-06 DIAGNOSIS — I5032 Chronic diastolic (congestive) heart failure: Secondary | ICD-10-CM

## 2014-02-06 DIAGNOSIS — J441 Chronic obstructive pulmonary disease with (acute) exacerbation: Secondary | ICD-10-CM

## 2014-02-15 LAB — HM DIABETES EYE EXAM

## 2014-02-21 ENCOUNTER — Encounter: Payer: Self-pay | Admitting: Family Medicine

## 2014-04-12 ENCOUNTER — Other Ambulatory Visit (INDEPENDENT_AMBULATORY_CARE_PROVIDER_SITE_OTHER): Payer: Medicare Other

## 2014-04-12 ENCOUNTER — Other Ambulatory Visit: Payer: Self-pay | Admitting: Family Medicine

## 2014-04-12 DIAGNOSIS — E1149 Type 2 diabetes mellitus with other diabetic neurological complication: Secondary | ICD-10-CM

## 2014-04-12 LAB — BASIC METABOLIC PANEL
BUN: 32 mg/dL — ABNORMAL HIGH (ref 6–23)
CO2: 34 mEq/L — ABNORMAL HIGH (ref 19–32)
Calcium: 8.6 mg/dL (ref 8.4–10.5)
Chloride: 96 mEq/L (ref 96–112)
Creatinine, Ser: 2.1 mg/dL — ABNORMAL HIGH (ref 0.4–1.5)
GFR: 32.91 mL/min — ABNORMAL LOW (ref >60.00)
Glucose, Bld: 226 mg/dL — ABNORMAL HIGH (ref 70–99)
Potassium: 4.6 mEq/L (ref 3.5–5.1)
Sodium: 137 mEq/L (ref 135–145)

## 2014-04-12 LAB — HEMOGLOBIN A1C: Hgb A1c MFr Bld: 8.5 % — ABNORMAL HIGH (ref 4.6–6.5)

## 2014-04-18 ENCOUNTER — Ambulatory Visit (INDEPENDENT_AMBULATORY_CARE_PROVIDER_SITE_OTHER): Payer: Medicare Other | Admitting: Family Medicine

## 2014-04-18 ENCOUNTER — Ambulatory Visit: Payer: Medicare Other | Admitting: Family Medicine

## 2014-04-18 ENCOUNTER — Encounter: Payer: Self-pay | Admitting: Family Medicine

## 2014-04-18 VITALS — BP 130/62 | HR 64 | Temp 97.7°F | Wt 221.0 lb

## 2014-04-18 DIAGNOSIS — N183 Chronic kidney disease, stage 3 unspecified: Secondary | ICD-10-CM

## 2014-04-18 DIAGNOSIS — R0989 Other specified symptoms and signs involving the circulatory and respiratory systems: Secondary | ICD-10-CM

## 2014-04-18 DIAGNOSIS — R0902 Hypoxemia: Secondary | ICD-10-CM

## 2014-04-18 DIAGNOSIS — R0609 Other forms of dyspnea: Secondary | ICD-10-CM

## 2014-04-18 DIAGNOSIS — E1149 Type 2 diabetes mellitus with other diabetic neurological complication: Secondary | ICD-10-CM

## 2014-04-18 DIAGNOSIS — E119 Type 2 diabetes mellitus without complications: Secondary | ICD-10-CM

## 2014-04-18 DIAGNOSIS — R0600 Dyspnea, unspecified: Secondary | ICD-10-CM

## 2014-04-18 NOTE — Progress Notes (Signed)
Pre visit review using our clinic review tool, if applicable. No additional management support is needed unless otherwise documented below in the visit note.  Diabetes:  Using medications without difficulties:yes Hypoglycemic episodes:no Hyperglycemic episodes:no Blood Sugars averaging: much improved recently with diet changes and insulin use.   A1c down to 8.5, the lowest he's had in >1 year.   SOB.  Off O2.  H/o flu 2015 O2 requirement after that.  Now with SOB with exertion.  Clearly limited distance for walking.  Less SOB at rest.  No positional sx.  Not much cough.  No fevers.    CKD.  Cr noted, labs d/w pt.  Similar to prev.  No edema.   PMH and SH reviewed  Meds, vitals, and allergies reviewed.   ROS: See HPI.  Otherwise negative.    GEN: nad, alert and oriented HEENT: mucous membranes moist NECK: supple w/o LA CV: rrr. PULM: ctab, no inc wob ABD: soft, +bs EXT: no edema SKIN: no acute rash

## 2014-04-18 NOTE — Patient Instructions (Signed)
Rosaria Ferries will call about your referral. Start back on oxygen at 2L per minute, if needed at rest or with exertion.  Recheck labs in about 3 months before a visit.  Restart your inhaler twice a day.  Take care. If not improved, then let me know.  Glad to see you.

## 2014-04-19 NOTE — Assessment & Plan Note (Addendum)
Worsened during his influenza admission. Not back to pre-illness baseline.  He tried to get off O2 but now with O2 sats 92% RA, 87% RA walking <100 ft, improved to 91% walking with 2L O2 via [place].  Will restart at 2L per [place] with exertion, prn.  If not improved, we can get him set up with pulmonary.  This looks to be pulmonary only, no CV- not fluid overloaded.  No CP.  He'll start back on his inhaler regularly.  He agrees with plan. >25 minutes spent in face to face time with patient, >50% spent in counselling or coordination of care.

## 2014-04-19 NOTE — Assessment & Plan Note (Signed)
Not fully to goal but the best A1c he's had.  He's working hard on diet and is adherent to meds.  Okay for outpatient f/u.  Recheck A1c in about 3 months.  He agrees.

## 2014-04-19 NOTE — Assessment & Plan Note (Signed)
Cr at baseline range, wouldn't change meds at this point.  Labs d/w pt.

## 2014-04-20 ENCOUNTER — Telehealth: Payer: Self-pay

## 2014-04-20 NOTE — Telephone Encounter (Signed)
[name] Small said she spoke with someone at [facility] (does not know name of person spoke with) and was advised [employer] said oxygen order was not complete. [name] Small will contact [name] Small at [facility] and will have someone from [facility] contact [name] Small. [name] Small voiced understanding.

## 2014-05-26 ENCOUNTER — Encounter: Payer: Self-pay | Admitting: Family Medicine

## 2014-05-26 ENCOUNTER — Ambulatory Visit (INDEPENDENT_AMBULATORY_CARE_PROVIDER_SITE_OTHER): Payer: Medicare Other | Admitting: Family Medicine

## 2014-05-26 VITALS — BP 148/68 | HR 62 | Temp 97.8°F | Wt 218.5 lb

## 2014-05-26 DIAGNOSIS — H6120 Impacted cerumen, unspecified ear: Secondary | ICD-10-CM

## 2014-05-26 DIAGNOSIS — H6123 Impacted cerumen, bilateral: Secondary | ICD-10-CM

## 2014-05-26 NOTE — Progress Notes (Signed)
Pre visit review using our clinic review tool, if applicable. No additional management support is needed unless otherwise documented below in the visit note.  He can still get SOB with exertion. Is wearing O2 at night.  93% today on RA.   B ear congestion- wax build up on B ears. No FCV.   Meds, vitals, and allergies reviewed.   ROS: See HPI.  Otherwise, noncontributory.  nad ncat B cerumen impaction resolved with irrigation and curette.

## 2014-05-26 NOTE — Patient Instructions (Signed)
Take care.  Glad to see you.  

## 2014-05-26 NOTE — Assessment & Plan Note (Signed)
Resolved with irritation and curette.  No complications. Tolerated well. D/w pt about home ear irritation.  F/u prn.

## 2014-07-12 ENCOUNTER — Other Ambulatory Visit (INDEPENDENT_AMBULATORY_CARE_PROVIDER_SITE_OTHER): Payer: Medicare Other

## 2014-07-12 DIAGNOSIS — E1149 Type 2 diabetes mellitus with other diabetic neurological complication: Secondary | ICD-10-CM

## 2014-07-12 DIAGNOSIS — G988 Other disorders of nervous system: Secondary | ICD-10-CM

## 2014-07-12 DIAGNOSIS — E1349 Other specified diabetes mellitus with other diabetic neurological complication: Secondary | ICD-10-CM

## 2014-07-12 DIAGNOSIS — E119 Type 2 diabetes mellitus without complications: Secondary | ICD-10-CM

## 2014-07-12 LAB — HEMOGLOBIN A1C: Hgb A1c MFr Bld: 8.9 % — ABNORMAL HIGH (ref 4.6–6.5)

## 2014-07-15 ENCOUNTER — Telehealth: Payer: Self-pay | Admitting: *Deleted

## 2014-07-15 NOTE — Telephone Encounter (Signed)
Form from [employer] placed in your In Box.

## 2014-07-18 NOTE — Telephone Encounter (Signed)
Form signed, thanks.

## 2014-07-19 ENCOUNTER — Encounter: Payer: Self-pay | Admitting: Family Medicine

## 2014-07-19 ENCOUNTER — Ambulatory Visit (INDEPENDENT_AMBULATORY_CARE_PROVIDER_SITE_OTHER): Payer: Medicare Other | Admitting: Family Medicine

## 2014-07-19 VITALS — BP 140/76 | HR 66 | Temp 97.7°F | Wt 220.5 lb

## 2014-07-19 DIAGNOSIS — E1142 Type 2 diabetes mellitus with diabetic polyneuropathy: Secondary | ICD-10-CM

## 2014-07-19 DIAGNOSIS — E1140 Type 2 diabetes mellitus with diabetic neuropathy, unspecified: Secondary | ICD-10-CM

## 2014-07-19 DIAGNOSIS — E1149 Type 2 diabetes mellitus with other diabetic neurological complication: Secondary | ICD-10-CM

## 2014-07-19 DIAGNOSIS — Z23 Encounter for immunization: Secondary | ICD-10-CM

## 2014-07-19 NOTE — Patient Instructions (Signed)
Call about seeing the kidney clinic.   Take care. Recheck in January or February 2016. Labs ahead of time. Glad to see you. Try to keep working on your diet.

## 2014-07-19 NOTE — Assessment & Plan Note (Signed)
A1c up some, d/w pt.  More of an issue with diet than meds. Compliant with lantus. Continue as is, he'll work on diet.  Trying to avoid mealtime insulin. Goal A1c ~8.  D/w pt.  He agrees.  He'll consider f/u with renal re:ARB use.  I didn't add today.  Flu shot today.  Recheck in about 4 months.  He agrees.

## 2014-07-19 NOTE — Telephone Encounter (Signed)
Form faxed back to [facility] and sent to be scanned into EMR.

## 2014-07-19 NOTE — Progress Notes (Signed)
Diabetes:  Using medications without difficulties:yes Hypoglycemic episodes:no Hyperglycemic episodes:occ  Feet problems: none new.  Still with change in sensation noted "like I'm walking on water."   Blood Sugars averaging: 140s, occ higher, occ lower ~100.  eye exam within last year:yes  His granddaughter recently died.  I offered my condolences.  He is trying to work through that.   Meds, vitals, and allergies reviewed.   ROS: See HPI.  Otherwise negative.    GEN: nad, alert and oriented HEENT: mucous membranes moist NECK: supple w/o LA CV: rrr. PULM: ctab, no inc wob ABD: soft, +bs EXT: no edema SKIN: no acute rash  Diabetic foot exam: Normal inspection No skin breakdown No calluses  Dec DP pulses Normal sensation to light touch and monofilament Nails normal

## 2014-09-09 ENCOUNTER — Encounter: Payer: Self-pay | Admitting: Family Medicine

## 2014-09-09 ENCOUNTER — Ambulatory Visit (INDEPENDENT_AMBULATORY_CARE_PROVIDER_SITE_OTHER): Payer: Medicare Other | Admitting: Family Medicine

## 2014-09-09 VITALS — BP 132/74 | HR 66 | Temp 97.6°F | Wt 218.2 lb

## 2014-09-09 DIAGNOSIS — H6123 Impacted cerumen, bilateral: Secondary | ICD-10-CM

## 2014-09-09 NOTE — Assessment & Plan Note (Signed)
R>L cerumen impaction, resolved B with irrigation and curette.  Recheck canals and TMs wnl  Feels better, hearing improved afterward.  No complications.  Can use debrox before a shower, with irrigation from the shower head if not turned up to very high flow rate.   F/u prn.

## 2014-09-09 NOTE — Patient Instructions (Signed)
Use the debrox before a shower and irrigate your ears in the shower.  Take care.

## 2014-09-09 NOTE — Progress Notes (Signed)
Pre visit review using our clinic review tool, if applicable. No additional management support is needed unless otherwise documented below in the visit note.  B cerumen, R>L.  Using syringe irrigation and occ debrox at baseline.  Dec in hearing.  No other complaints.  Meds, vitals, and allergies reviewed.   ROS: See HPI.  Otherwise, noncontributory.  nad B hard of hearing. R>L cerumen impaction, resolved B with irrigation and curette.   Recheck canals and TMs wnl Feels better, hearing improved afterward.  No complications.

## 2014-09-16 ENCOUNTER — Telehealth: Payer: Self-pay

## 2014-09-16 NOTE — Telephone Encounter (Signed)
Called pt, lmovm advising to call back to schedule Annual medicare exam for 2015 per Medicare.

## 2014-11-23 DIAGNOSIS — I509 Heart failure, unspecified: Secondary | ICD-10-CM | POA: Diagnosis not present

## 2014-11-27 ENCOUNTER — Other Ambulatory Visit: Payer: Self-pay | Admitting: Family Medicine

## 2014-11-27 DIAGNOSIS — E119 Type 2 diabetes mellitus without complications: Secondary | ICD-10-CM

## 2014-11-29 ENCOUNTER — Other Ambulatory Visit (INDEPENDENT_AMBULATORY_CARE_PROVIDER_SITE_OTHER): Payer: Medicare Other

## 2014-11-29 DIAGNOSIS — E119 Type 2 diabetes mellitus without complications: Secondary | ICD-10-CM

## 2014-11-29 LAB — LIPID PANEL
Cholesterol: 161 mg/dL (ref 0–200)
HDL: 35.9 mg/dL — ABNORMAL LOW (ref >39.00)
LDL Cholesterol: 95 mg/dL (ref 0–99)
NonHDL: 125.1
Total CHOL/HDL Ratio: 4
Triglycerides: 151 mg/dL — ABNORMAL HIGH (ref 0.0–149.0)
VLDL: 30.2 mg/dL (ref 0.0–40.0)

## 2014-11-29 LAB — COMPREHENSIVE METABOLIC PANEL
ALT: 10 U/L (ref 0–53)
AST: 16 U/L (ref 0–37)
Albumin: 3.8 g/dL (ref 3.5–5.2)
Alkaline Phosphatase: 49 U/L (ref 39–117)
BUN: 33 mg/dL — ABNORMAL HIGH (ref 6–23)
CO2: 35 mEq/L — ABNORMAL HIGH (ref 19–32)
Calcium: 8.4 mg/dL (ref 8.4–10.5)
Chloride: 99 mEq/L (ref 96–112)
Creatinine, Ser: 1.85 mg/dL — ABNORMAL HIGH (ref 0.40–1.50)
GFR: 37.2 mL/min — ABNORMAL LOW (ref >60.00)
Glucose, Bld: 155 mg/dL — ABNORMAL HIGH (ref 70–99)
Potassium: 4.7 mEq/L (ref 3.5–5.1)
Sodium: 138 mEq/L (ref 135–145)
Total Bilirubin: 0.5 mg/dL (ref 0.2–1.2)
Total Protein: 6.9 g/dL (ref 6.0–8.3)

## 2014-11-29 LAB — HEMOGLOBIN A1C: Hgb A1c MFr Bld: 8.7 % — ABNORMAL HIGH (ref 4.6–6.5)

## 2014-12-05 ENCOUNTER — Encounter: Payer: Self-pay | Admitting: Family Medicine

## 2014-12-05 ENCOUNTER — Ambulatory Visit (INDEPENDENT_AMBULATORY_CARE_PROVIDER_SITE_OTHER): Payer: Medicare Other | Admitting: Family Medicine

## 2014-12-05 VITALS — BP 132/66 | HR 75 | Temp 98.2°F | Wt 219.5 lb

## 2014-12-05 DIAGNOSIS — E1140 Type 2 diabetes mellitus with diabetic neuropathy, unspecified: Secondary | ICD-10-CM | POA: Diagnosis not present

## 2014-12-05 DIAGNOSIS — I5032 Chronic diastolic (congestive) heart failure: Secondary | ICD-10-CM | POA: Diagnosis not present

## 2014-12-05 DIAGNOSIS — R252 Cramp and spasm: Secondary | ICD-10-CM | POA: Diagnosis not present

## 2014-12-05 NOTE — Progress Notes (Signed)
Pre visit review using our clinic review tool, if applicable. No additional management support is needed unless otherwise documented below in the visit note.  Diabetes:  Using medications without difficulties:yes Hypoglycemic episodes:no.   Hyperglycemic episodes:no Feet problems: see below.  Blood Sugars averaging: ~120 in AM, occ lower eye exam within last year:yes Working on diet, d/w pt.   Some B lower leg and foot cramping.  Happens with walking.  Somewhat worse recently, was a chronic issue for him.   A1c similar to prev, some improvement overall in his A1c trend.    D/w pt- he is due for cards f/u and I would like cards input on his PVD.   Meds, vitals, and allergies reviewed.   ROS: See HPI.  Otherwise negative.    GEN: nad, alert and oriented HEENT: mucous membranes moist NECK: supple w/o LA CV: rrr. PULM: ctab, no inc wob ABD: soft, +bs EXT: no edema SKIN: no acute rash  Diabetic foot exam: Normal inspection No skin breakdown No calluses  Dec DP pulses B Slight dec sensation to light touch and monofilament Nails normal

## 2014-12-05 NOTE — Patient Instructions (Signed)
Please call about the cardiology appointment.  Tell them you have had cramping in the lower legs and feet with walking/standing.  If you have trouble getting the appointment scheduled, then let us know.  Recheck here in about 3-4 months, labs ahead of time.  Keep working on your diet in the meantime.  Take care.  Glad to see you.

## 2014-12-06 ENCOUNTER — Ambulatory Visit: Payer: Medicare Other | Admitting: Family Medicine

## 2014-12-06 MED ORDER — AMLODIPINE BESYLATE 5 MG PO TABS: 5.0000 mg | ORAL_TABLET | Freq: Every morning | ORAL | Status: DC

## 2014-12-06 MED ORDER — GLIPIZIDE ER 5 MG PO TB24: 5.0000 mg | ORAL_TABLET | Freq: Every day | ORAL | Status: DC

## 2014-12-06 MED ORDER — FUROSEMIDE 40 MG PO TABS: 40.0000 mg | ORAL_TABLET | ORAL | Status: DC

## 2014-12-06 NOTE — Assessment & Plan Note (Signed)
Appears euvolemic and he feels well today.   He'll call about routine cards f/u.

## 2014-12-06 NOTE — Assessment & Plan Note (Signed)
This looks to be claudication now.  D/w pt.  Will ask for cards input, he'll call for appointment.  He has worked to get his LDL <100 and his BP is controlled today.  No skin breakdown.

## 2014-12-06 NOTE — Assessment & Plan Note (Signed)
He wants to avoid more meds, goal A1c ~8 given his age.  Would not change meds at this point, wouldn't add on mealtime insulin.  He'll work on diet and he'll see how much change he can get in his A1c.  He agrees.

## 2014-12-24 DIAGNOSIS — I509 Heart failure, unspecified: Secondary | ICD-10-CM | POA: Diagnosis not present

## 2015-01-17 DIAGNOSIS — E119 Type 2 diabetes mellitus without complications: Secondary | ICD-10-CM | POA: Diagnosis not present

## 2015-01-22 DIAGNOSIS — I509 Heart failure, unspecified: Secondary | ICD-10-CM | POA: Diagnosis not present

## 2015-01-25 IMAGING — CR DG CHEST 2V
2 series · 2 of 2 positions shown · non-contrast
Comparison: DG CHEST 2 VIEW dated 01/17/2014; DG CHEST 2 VIEW dated
01/06/2013

CLINICAL DATA: Hypoxia and cough, history of diabetes

EXAM:
CHEST  2 VIEW

[w chest pa]
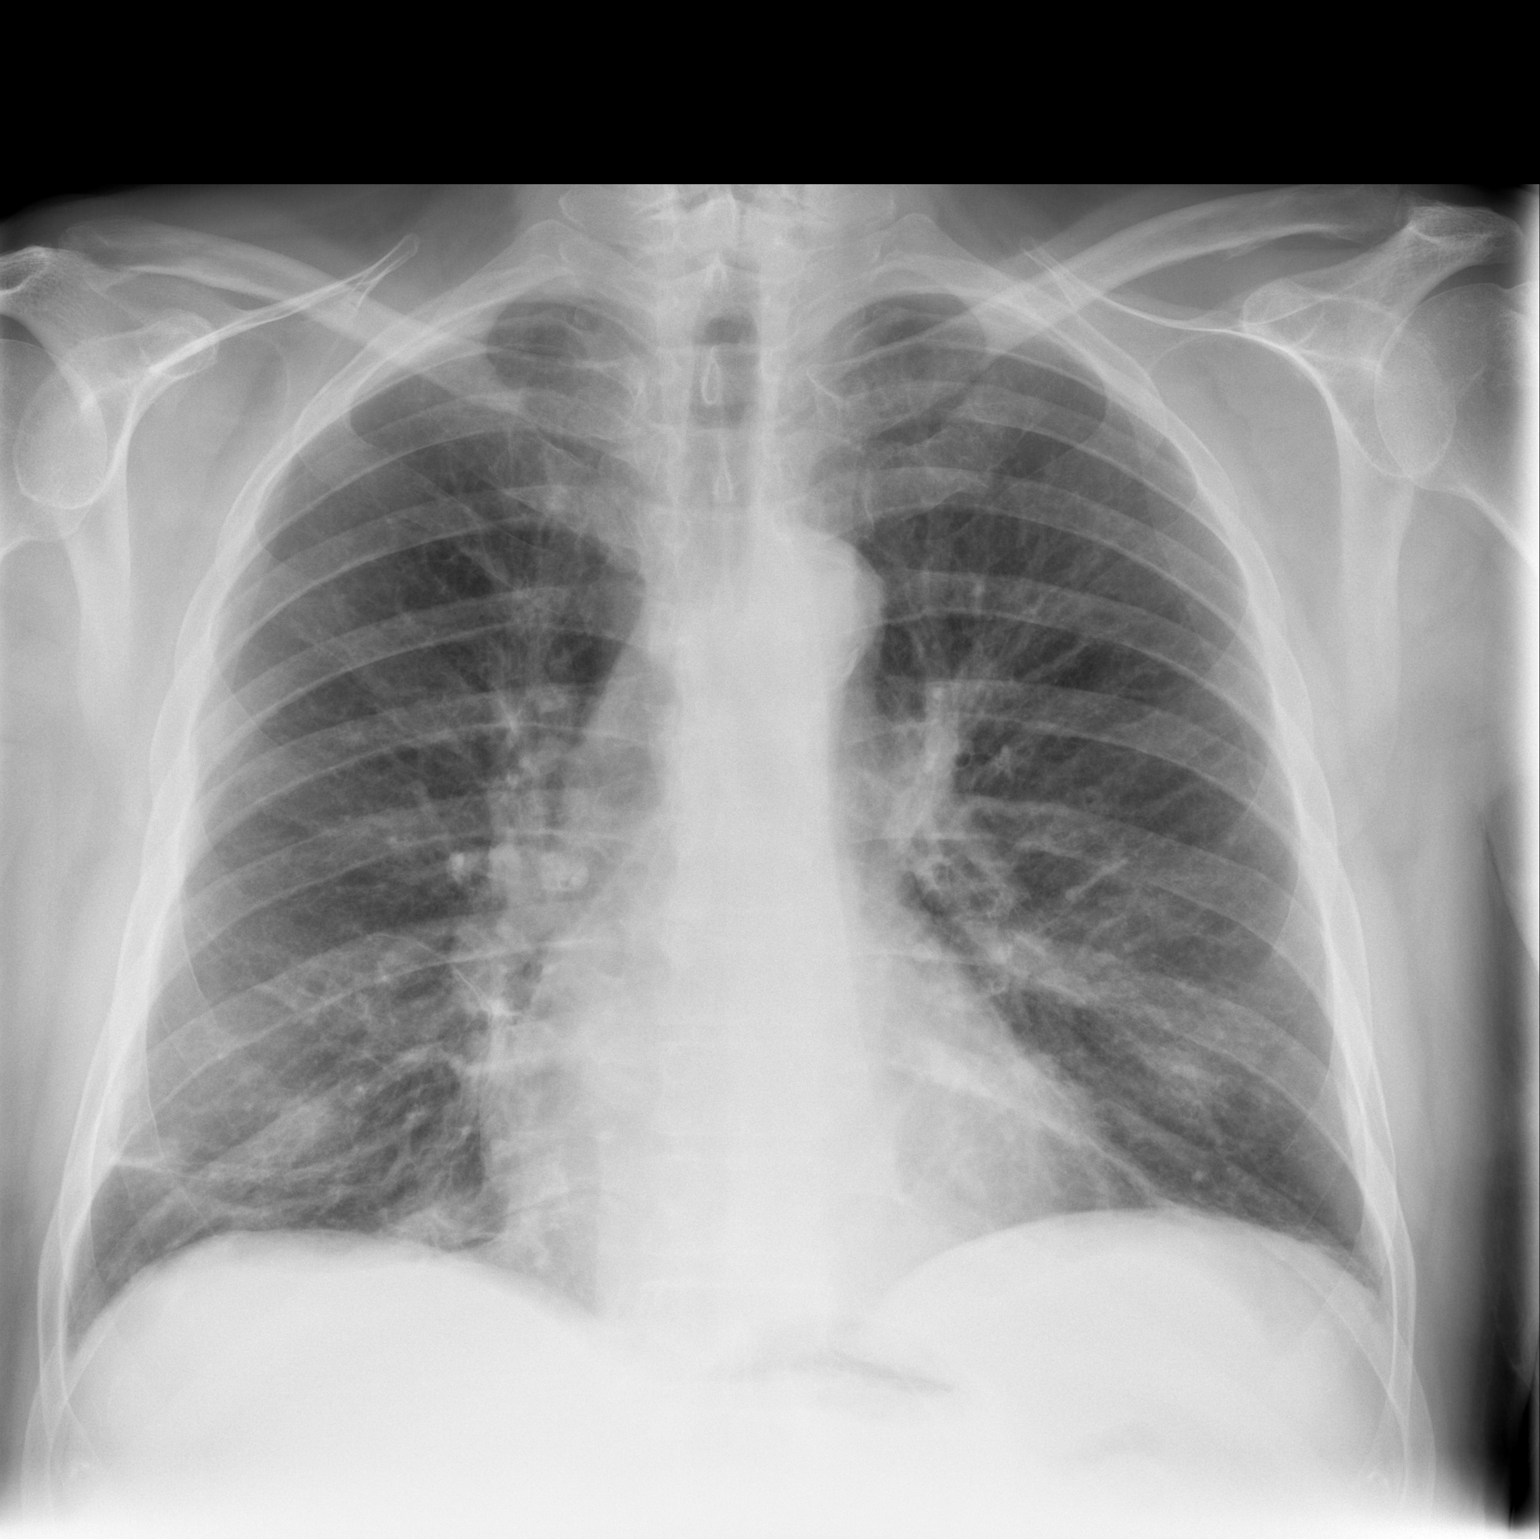

[w chest lat]
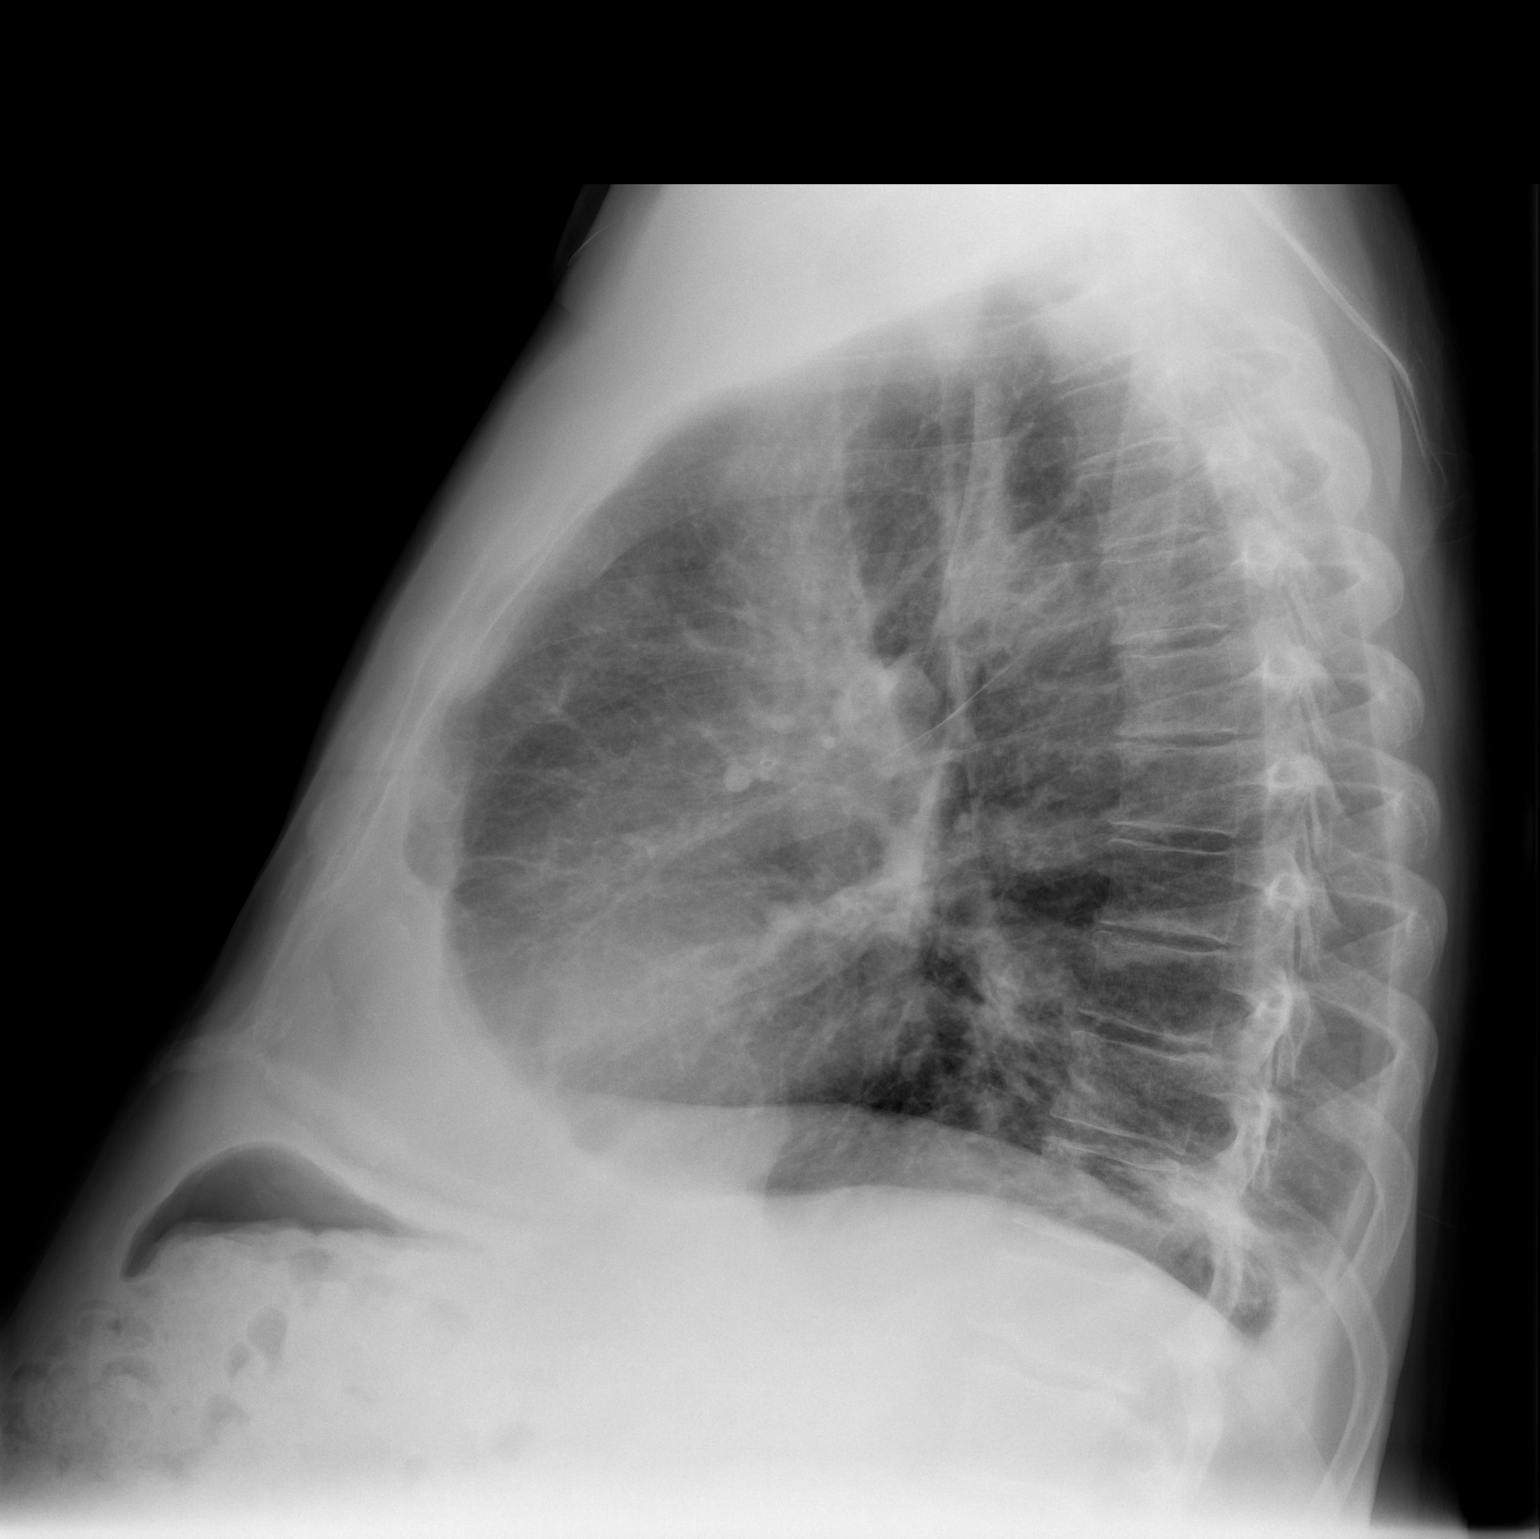

[2 of 2 positions shown; findings below may reference images not displayed]

FINDINGS: The lungs are well-expanded. The interstitial markings have become
more conspicuous bilaterally especially in the mid and lower lung
zones since the earlier study. There is density in the posterior
lateral aspect of the right lower lobe consistent with atelectasis.
The cardiac silhouette is normal in size. The pulmonary vascularity
is not clearly engorged. No significant pleural effusion is
demonstrated. The observed portions of the bony thorax exhibit no
acute abnormalities.
IMPRESSION: New subsegmental atelectasis is present predominantly on the right.
Classic alveolar pneumonia is not demonstrated and there is no
definite evidence of CHF. Further evaluation with chest CT scanning
to exclude possible pulmonary embolism is recommended.

## 2015-01-31 ENCOUNTER — Ambulatory Visit (INDEPENDENT_AMBULATORY_CARE_PROVIDER_SITE_OTHER): Payer: Medicare Other | Admitting: Cardiology

## 2015-01-31 ENCOUNTER — Encounter: Payer: Self-pay | Admitting: Cardiology

## 2015-01-31 ENCOUNTER — Other Ambulatory Visit (HOSPITAL_COMMUNITY): Payer: Self-pay | Admitting: Cardiology

## 2015-01-31 VITALS — BP 138/62 | HR 69 | Ht 74.0 in | Wt 221.8 lb

## 2015-01-31 DIAGNOSIS — I739 Peripheral vascular disease, unspecified: Secondary | ICD-10-CM

## 2015-01-31 DIAGNOSIS — R0609 Other forms of dyspnea: Secondary | ICD-10-CM

## 2015-01-31 DIAGNOSIS — I509 Heart failure, unspecified: Secondary | ICD-10-CM | POA: Diagnosis not present

## 2015-01-31 DIAGNOSIS — I10 Essential (primary) hypertension: Secondary | ICD-10-CM | POA: Diagnosis not present

## 2015-01-31 DIAGNOSIS — E785 Hyperlipidemia, unspecified: Secondary | ICD-10-CM | POA: Diagnosis not present

## 2015-01-31 DIAGNOSIS — R0600 Dyspnea, unspecified: Secondary | ICD-10-CM

## 2015-01-31 NOTE — Progress Notes (Signed)
Patient ID: KELANI PRITCHETT, male   DOB: [birth year], 81 y.o.   MRN: [id number]      Cardiology Office Note   Date:  01/31/2015   ID:  Jerrian, Burges 09-Nov-1928, MRN [id number]  PCP:  [name], MD  Cardiologist:  Dr Verl Blalock,  Dorothy Spark, MD   Chief complain: DOE, palpitations   History of Present Illness: AOUS MODEL is a 81 y.o. male returns today for evaluation and management of his nonobstructive coronary artery disease, normal left ventricular function, and normal right-sided function and pressures. I saw him several years ago which time he is having significant dyspnea on exertion. He was last seen by Dr Verl Blalock 2 years ago. Since that time he feels more SOB on exertion, while playing golf, when he has to take breaks, he denies any chest pain. His main concern is pain in his legs while walking, this has been chronic but not addressed. He denies any palpitations, syncope, orthopnea, PND.   He has finished aggressive chemotherapy for invasive bladder cancer in 2012. He has substantial problems with bruising and bleeding with low-dose aspirin in the past. He would prefer not to take it.   Past Medical History  Diagnosis Date  . BENIGN PROSTATIC HYPERTROPHY 05/21/2007  . DIABETES MELLITUS, TYPE II 05/21/2007  . DIVERTICULOSIS, COLON 05/21/2007    pt denies  . HYPERTENSION 05/21/2007  . PSORIASIS, SCALP 10/25/2008  . Cellulitis of left foot     Hospitalized in 2006  . Gross hematuria     pt denies  . Psoriasis ELBOWS  . Fatigue AGE-RELATED  . Arthritis   . CAD (coronary artery disease) CARDIOLOGIST- DR WALL - VISIT IN JUNE 2012 FOR CATH    nonobstructive by cath 6/12:  mid LAD 30%, proximal obtuse marginal-2 30%, proximal RCA 20%, mid RCA 30-40%.  He had normal cardiac output and mildly elevated filling pressures but no significant pulmonary hypertension;   Echocardiogram in May 2012 demonstrated EF 50-55% and left atrial enlargement   . Impaired hearing BILATERAL  HEARING AIDS  . Bladder cancer     2013  . Chronic kidney disease (CKD), stage III (moderate) 12/04/2007    FOLLOWED BY DR PATEL  . Back pain     s/p lumbar injection 2014    Past Surgical History  Procedure Laterality Date  . Vasectomy  15 YRS AGO  W/ ANES.  Marland Kitchen Penile prosthesis implant  15 YRS AGO  . Cardiovascular stress test  2007  . Cardiac catheterization  04-24-11/  DR MUHAMMAD ARIDA    MILD NONOBSTRUCTIVE CAD, NORMA CARDIAC OUTPUT  . Appendectomy  AGE 38  . Tonsillectomy and adenoidectomy  AGE 21 YRS AGO  . Cataract extraction w/ intraocular lens  implant, bilateral    . Incision and drainage foot  LEFT FOOT DUE TO INFECTION FROM  NAIL PUNCTURE INJURY  . Cystoscopy w/ retrogrades  10/30/2011    Procedure: CYSTOSCOPY WITH RETROGRADE PYELOGRAM;  Surgeon: Molli Hazard, MD;  Location: [facility];  Service: Urology;  Laterality: Bilateral;  CYSTOSCOPY POSS TURBT BILATERAL RETROGRADE PYLEOGRAM POSS BILATERAL URETEROSCOPY WITH BIOPSY LASER ABLATION OF LESION POSS BILATERAL URETERAL STENT PLACEMENT   CARM FLEXIBLE URETERAL SCOPE DIGITAL URETERAL SCOPE PK GYRUS HO  . Transurethral resection of bladder tumor  10/30/2011    Procedure: TRANSURETHRAL RESECTION OF BLADDER TUMOR (TURBT);  Surgeon: Molli Hazard, MD;  Location: [facility];  Service: Urology;  Laterality: N/A;  . Cystoscopy  12/25/2011  Procedure: CYSTOSCOPY;  Surgeon: Molli Hazard, MD;  Location: [facility];  Service: Urology;  Laterality: N/A;  needs intubation and to be paralyzed   . Transurethral resection of bladder tumor  12/25/2011    Procedure: TRANSURETHRAL RESECTION OF BLADDER TUMOR (TURBT);  Surgeon: Molli Hazard, MD;  Location: [facility];  Service: Urology;  Laterality: N/A;  need long gyrus instruments Dr states he has spoken w/ Nelwyn Salisbury regarding this    . Cystoscopy w/ retrogrades  11/30/2012    Procedure:  CYSTOSCOPY WITH RETROGRADE PYELOGRAM;  Surgeon: Molli Hazard, MD;  Location: [facility];  Service: Urology;  Laterality: Bilateral;  Flexible cystoscopy.  . Cystoscopy with biopsy  11/30/2012    Procedure: CYSTOSCOPY WITH BIOPSY;  Surgeon: Molli Hazard, MD;  Location: [facility];  Service: Urology;  Laterality: N/A;     Current Outpatient Prescriptions  Medication Sig Dispense Refill  . amLODipine (NORVASC) 5 MG tablet Take 1 tablet (5 mg total) by mouth every morning. 90 tablet 3  . budesonide-formoterol (SYMBICORT) 160-4.5 MCG/ACT inhaler Inhale 2 puffs into the lungs 2 (two) times daily as needed.    . fluocinonide-emollient (LIDEX-E) 0.05 % cream Apply 1 application topically 2 (two) times daily as needed (for skin irritation).    . furosemide (LASIX) 40 MG tablet Take 1 tablet (40 mg total) by mouth every Monday, Wednesday, and Friday. 45 tablet 3  . gabapentin (NEURONTIN) 100 MG capsule Take 100 mg by mouth at bedtime as needed.     Marland Kitchen glipiZIDE (GLUCOTROL XL) 5 MG 24 hr tablet Take 1 tablet (5 mg total) by mouth daily with breakfast. 90 tablet 3  . insulin glargine (LANTUS) 100 UNIT/ML injection Inject 25 Units into the skin daily at 3 pm.    . simethicone (MYLICON) 80 MG chewable tablet Chew 1 tablet (80 mg total) by mouth 4 (four) times daily as needed for flatulence. 30 tablet 0   No current facility-administered medications for this visit.    Allergies:   Actos; Aspirin; Ace inhibitors; Bactrim; and Sulfa drugs cross reactors    Social History:  The patient  reports that he quit smoking about 46 years ago. His smoking use included Cigarettes. He has a 44 pack-year smoking history. He has never used smokeless tobacco. He reports that he drinks about 1.2 oz of alcohol per week. He reports that he does not use illicit drugs.   Family History:  The patient's family history includes Cancer in his brother; Diabetes in his mother; Heart  disease in his brother, brother, brother, brother, brother, and brother.    ROS:  Please see the history of present illness.    All other systems are reviewed and negative.    PHYSICAL EXAM: VS:  BP 138/62 mmHg  Pulse 69  Ht 6\' 2"  (1.88 m)  Wt 221 lb 12.8 oz (100.608 kg)  BMI 28.47 kg/m2 , BMI Body mass index is 28.47 kg/(m^2). GEN: Well nourished, well developed, in no acute distress HEENT: normal Neck: no JVD, carotid bruits, or masses Cardiac: RRR; no murmurs, rubs, or gallops,no edema  Respiratory:  clear to auscultation bilaterally, normal work of breathing GI: soft, nontender, nondistended, + BS LE: changes of chronic venous stasis, poorly palpable pulses on DP/PT B/L MS: no deformity or atrophy Skin: warm and dry, no rash Neuro:  Strength and sensation are intact Psych: euthymic mood, full affect   EKG:  EKG is ordered today. The ekg ordered  today demonstrates SR, Normal ECG   Recent Labs: 11/29/2014: ALT 10; BUN 33*; Creatinine 1.85*; Potassium 4.7; Sodium 138    Lipid Panel    Component Value Date/Time   CHOL 161 11/29/2014 0922   TRIG 151.0* 11/29/2014 0922   HDL 35.90* 11/29/2014 0922   CHOLHDL 4 11/29/2014 0922   VLDL 30.2 11/29/2014 0922   LDLCALC 95 11/29/2014 0922   LDLDIRECT 147.6 04/27/2009 1012      Wt Readings from Last 3 Encounters:  01/31/15 221 lb 12.8 oz (100.608 kg)  12/05/14 219 lb 8 oz (99.565 kg)  09/09/14 218 lb 4 oz (98.998 kg)    R+L cath: 04/25/2011 PROCEDURES/DIAGNOSTICS PERFORMED DURING HOSPITALIZATION: 1. Right and left heart catheterization with coronary angiography.  a. Mild nonobstructive coronary artery disease. 2. Mildly elevated filling pressures. Left ventricular end-diastolic  pressure of 13, pulmonary capillary wedge pressure 16. RV 35/7.  Other studies Reviewed: Additional studies/ records that were reviewed today include: cath. Review of the above records demonstrates: as above   ASSESSMENT AND  PLAN:  81 year old male  1. CAD, non-obstructive - worsening DOE, we will schedule a Lexiscan nuclear stress test (unable to walk on treadmill)  2. HTN - controlled  3. Claudications - check B/L LE [place]  4. Hyperlipidemia - controlled   Current medicines are reviewed at length with the patient today.  The patient does not have concerns regarding medicines.  The following changes have been made:  no change  Labs/ tests ordered today include:  Orders Placed This Encounter  Procedures  . Myocardial Perfusion Imaging  . EKG 12-Lead  . Lower Extremity Arterial Duplex Bilateral     Disposition:   FU with Ena Dawley H  in 1 year   Signed, Dorothy Spark, MD  01/31/2015 10:01 PM    [facility] [place], [place], [place]  [address] Phone: [phone]; Fax: [phone]

## 2015-01-31 NOTE — Patient Instructions (Signed)
Your physician recommends that you continue on your current medications as directed. Please refer to the Current Medication list given to you today.    Your physician has requested that you have a lexiscan myoview. For further information please visit [email or web address]. Please follow instruction sheet, as given.    Your physician has requested that you have a lower extremity arterial duplex. This test is an ultrasound of the arteries in the legs or arms. It looks at arterial blood flow in the legs and arms. Allow one hour for Lower and Upper Arterial scans. There are no restrictions or special instructions    Your physician wants you to follow-up in: [place] will receive a reminder letter in the mail two months in advance. If you don't receive a letter, please call our office to schedule the follow-up appointment.

## 2015-02-07 ENCOUNTER — Encounter: Payer: Self-pay | Admitting: Cardiology

## 2015-02-08 ENCOUNTER — Ambulatory Visit (HOSPITAL_COMMUNITY): Payer: Medicare Other | Attending: Cardiovascular Disease | Admitting: Cardiology

## 2015-02-08 DIAGNOSIS — R0989 Other specified symptoms and signs involving the circulatory and respiratory systems: Secondary | ICD-10-CM

## 2015-02-08 DIAGNOSIS — I739 Peripheral vascular disease, unspecified: Secondary | ICD-10-CM | POA: Diagnosis not present

## 2015-02-08 DIAGNOSIS — I509 Heart failure, unspecified: Secondary | ICD-10-CM | POA: Insufficient documentation

## 2015-02-08 NOTE — Progress Notes (Signed)
Lower arterial Doppler and bilateral Duplex performed  

## 2015-02-09 ENCOUNTER — Ambulatory Visit (HOSPITAL_COMMUNITY): Payer: Medicare Other | Attending: Cardiology | Admitting: Radiology

## 2015-02-09 DIAGNOSIS — I739 Peripheral vascular disease, unspecified: Secondary | ICD-10-CM

## 2015-02-09 DIAGNOSIS — Z794 Long term (current) use of insulin: Secondary | ICD-10-CM | POA: Insufficient documentation

## 2015-02-09 DIAGNOSIS — I10 Essential (primary) hypertension: Secondary | ICD-10-CM | POA: Insufficient documentation

## 2015-02-09 DIAGNOSIS — I2510 Atherosclerotic heart disease of native coronary artery without angina pectoris: Secondary | ICD-10-CM | POA: Diagnosis not present

## 2015-02-09 DIAGNOSIS — E785 Hyperlipidemia, unspecified: Secondary | ICD-10-CM

## 2015-02-09 DIAGNOSIS — R0609 Other forms of dyspnea: Secondary | ICD-10-CM | POA: Insufficient documentation

## 2015-02-09 DIAGNOSIS — I509 Heart failure, unspecified: Secondary | ICD-10-CM

## 2015-02-09 DIAGNOSIS — E119 Type 2 diabetes mellitus without complications: Secondary | ICD-10-CM | POA: Diagnosis not present

## 2015-02-09 DIAGNOSIS — R0602 Shortness of breath: Secondary | ICD-10-CM | POA: Diagnosis not present

## 2015-02-09 DIAGNOSIS — R0600 Dyspnea, unspecified: Secondary | ICD-10-CM

## 2015-02-09 MED ORDER — REGADENOSON 0.4 MG/5ML IV SOLN
0.4000 mg | Freq: Once | INTRAVENOUS | Status: AC
  Administered 2015-02-09: 0.4 mg via INTRAVENOUS

## 2015-02-09 MED ORDER — TECHNETIUM TC 99M SESTAMIBI GENERIC - CARDIOLITE
11.0000 | Freq: Once | INTRAVENOUS | Status: AC | PRN
  Administered 2015-02-09: 11 via INTRAVENOUS

## 2015-02-09 MED ORDER — TECHNETIUM TC 99M SESTAMIBI GENERIC - CARDIOLITE
33.0000 | Freq: Once | INTRAVENOUS | Status: AC | PRN
  Administered 2015-02-09: 33 via INTRAVENOUS

## 2015-02-09 NOTE — Progress Notes (Signed)
[facility] [address] [place], [place] [address] [phone]    Cardiology Nuclear Med Study  [name] Small is a 81 y.o. male     MRN : [id number]     DOB: [birth year]  Procedure Date: 02/09/2015  Nuclear Med Background Indication for Stress Test:  Evaluation for Ischemia and Follow-up CAD History:  CAD (n/o) MPI 2007 (scar) EF 55% Cardiac Risk Factors: Hypertension and IDDM  Symptoms:  DOE and SOB   Nuclear Pre-Procedure Caffeine/Decaff Intake:  None> 12 hrs NPO After: 4:00pm   Lungs:  clear O2 Sat: 95% on room air. IV 0.9% NS with Angio Cath:  22g  IV Site: L Hand, tolerated well IV Started by:  Irven Baltimore, RN  Chest Size (in):  46 Cup Size: n/a  Height: 6\' 2"  (1.88 m)  Weight:  217 lb (98.431 kg)  BMI:  Body mass index is 27.85 kg/(m^2). Tech Comments:  Last dose of Lantus  Insulin was 2:00pm yesterday. Fasting CBG was 160 at 6:00am today. Irven Baltimore, RN.    Nuclear Med Study 1 or 2 day study: 1 day  Stress Test Type:  Carlton Adam  Reading MD: N/A  Order Authorizing Provider:  Ena Dawley, MD  Resting Radionuclide: Technetium 60m Sestamibi  Resting Radionuclide Dose: 11.0 mCi   Stress Radionuclide:  Technetium 81m Sestamibi  Stress Radionuclide Dose: 33.0 mCi           Stress Protocol Rest HR: 64 Stress HR: 73  Rest BP: 142/78 Stress BP: 115/57  Exercise Time (min): n/a METS: n/a   Predicted Max HR: 136 bpm % Max HR: 53.68 bpm Rate Pressure Product: 6935   Dose of Adenosine (mg):  n/a Dose of Lexiscan: 0.4 mg  Dose of Atropine (mg): n/a Dose of Dobutamine: n/a mcg/kg/min (at max HR)  Stress Test Technologist: Perrin Maltese, EMT-P  Nuclear Technologist:  Earl Many, CNMT     Rest Procedure:  Myocardial perfusion imaging was performed at rest 45 minutes following the intravenous administration of Technetium 36m Sestamibi. Rest ECG: NSR - Normal EKG  Stress Procedure:  The patient received IV Lexiscan 0.4 mg  over 15-seconds.  Technetium 57m Sestamibi injected at 30-seconds.  Quantitative spect images were obtained after a 45 minute delay.  During the infusion of Lexiscan the patient complained of SOB and chest tightness.  These symptoms began to subside in recovery.  Stress ECG: No significant change from baseline ECG  QPS Raw Data Images:  Normal; no motion artifact; normal heart/lung ratio. Stress Images:  There is decreased uptake in the inferior wall. Rest Images:  there is minimal improvement in the inferior wall uptake Subtraction (SDS):  There is a fixed defect that is most consistent with a previous infarction. Transient Ischemic Dilatation (Normal <1.22):  1.08 Lung/Heart Ratio (Normal <0.45):  0.41  Quantitative Gated Spect Images QGS EDV:  121 ml QGS ESV:  59 ml  Impression Exercise Capacity:  Lexiscan with no exercise. BP Response:  Normal blood pressure response. Clinical Symptoms:  No significant symptoms noted. ECG Impression:  No significant ECG changes with Lexiscan. Comparison with Prior Nuclear Study: No significant change from previous study. Previous EF was 55%.  Overall Impression:  Low risk stress nuclear study with a mostly fixed inferior wall defect with minimal periinfarct ischemia. A component of diaphragmatic attenuation artifact is also likely present.  LV Ejection Fraction: 51%.  LV Wall Motion:  Borderline LV systolic function. Mild inferior wall hypokinesis.  [name], MD, [facility] [phone] office [phone] pager

## 2015-02-14 ENCOUNTER — Telehealth: Payer: Self-pay | Admitting: *Deleted

## 2015-02-14 DIAGNOSIS — I25709 Atherosclerosis of coronary artery bypass graft(s), unspecified, with unspecified angina pectoris: Secondary | ICD-10-CM

## 2015-02-14 DIAGNOSIS — E785 Hyperlipidemia, unspecified: Secondary | ICD-10-CM

## 2015-02-14 DIAGNOSIS — I509 Heart failure, unspecified: Secondary | ICD-10-CM

## 2015-02-14 MED ORDER — PRAVASTATIN SODIUM 10 MG PO TABS
10.0000 mg | ORAL_TABLET | Freq: Every day | ORAL | Status: DC
Start: 2015-02-14 — End: 2015-04-07

## 2015-02-14 NOTE — Addendum Note (Signed)
Addended by: Nuala Alpha on: 02/14/2015 03:39 PM   Modules accepted: Orders

## 2015-02-14 NOTE — Telephone Encounter (Signed)
-----   Message from Dorothy Spark, MD sent at 02/14/2015  1:02 PM EDT ----- He has mildly abnormal stress test, I wouldn't recommend cardiac cath but we need to intensify medical therapy for CAD. I would start him on aspirin 81 mg po daily and pravastatin 10 mg po daily. We should schedule a CMP in 4 weeks and follow up with me in 2 months. Thank you, KN

## 2015-02-14 NOTE — Telephone Encounter (Signed)
Contacted the pt to inform him of his stress test results per Dr Meda Coffee showing mildly abnormal, and she does not recommend a cardiac cath at this time, but to intensify his medical therapy for CAD.  Informed the pt that per Dr Meda Coffee she would like for him to start taking Aspirin 81 mg po daily, and start taking pravastatin 10 mg po daily.  Informed the pt that per Dr Meda Coffee she would like for him to come back in 4 weeks to have a repeat CMP and a follow-up appt with her for 2 months out.   Pt states he cannot take Aspirin, for he was taken off of this medication because he is allergic to ASA.  This is updated in pts chart. Pt requesting a 30 day supply of pravastatin to be sent to [facility] , instead of mail order, for he wants to see how he responds to this medication. Pt states he responds well to the med, then he will contact our office to send a 90 day supply to his mail order pharmacy.  Pt would like his lab appt scheduled for 03/14/15 at our office.  Scheduled this at pts request.  Informed the pt that a scheduler will be in contact with him to set his 2 month follow-up appt with Dr Meda Coffee up.  Informed the pt that I will also send Dr Meda Coffee a message about his Aspirin allergy, and to advise on a different regimen in its place.  Pt verbalized understanding and agrees with this plan.

## 2015-02-15 MED ORDER — CLOPIDOGREL BISULFATE 75 MG PO TABS: 75.0000 mg | ORAL_TABLET | Freq: Every day | ORAL | Status: DC

## 2015-02-15 NOTE — Telephone Encounter (Signed)
I would start him on plavix 75 mg po daily instead

## 2015-02-15 NOTE — Addendum Note (Signed)
Addended by: Nuala Alpha on: 02/15/2015 03:40 PM   Modules accepted: Orders

## 2015-02-15 NOTE — Telephone Encounter (Signed)
Informed the pt and wife that per Dr Meda Coffee this pt should start taking Plavix 75 mg po daily instead of ASA, due to his ASA allergy.  Pt and wife both verbalized understanding and agree with this plan.

## 2015-02-16 ENCOUNTER — Telehealth: Payer: Self-pay | Admitting: Cardiology

## 2015-02-16 NOTE — Telephone Encounter (Signed)
Follow up:   Pt's wife called and would like copies of pt's test done in March 31-30 2016  Myo and Doppler, Mailed to her please.

## 2015-02-22 DIAGNOSIS — I509 Heart failure, unspecified: Secondary | ICD-10-CM | POA: Diagnosis not present

## 2015-03-14 ENCOUNTER — Other Ambulatory Visit (INDEPENDENT_AMBULATORY_CARE_PROVIDER_SITE_OTHER): Payer: Medicare Other | Admitting: *Deleted

## 2015-03-14 DIAGNOSIS — E785 Hyperlipidemia, unspecified: Secondary | ICD-10-CM

## 2015-03-14 DIAGNOSIS — I25709 Atherosclerosis of coronary artery bypass graft(s), unspecified, with unspecified angina pectoris: Secondary | ICD-10-CM

## 2015-03-14 DIAGNOSIS — I509 Heart failure, unspecified: Secondary | ICD-10-CM | POA: Diagnosis not present

## 2015-03-14 LAB — COMPREHENSIVE METABOLIC PANEL
ALT: 8 U/L (ref 0–53)
AST: 13 U/L (ref 0–37)
Albumin: 3.8 g/dL (ref 3.5–5.2)
Alkaline Phosphatase: 48 U/L (ref 39–117)
BUN: 27 mg/dL — ABNORMAL HIGH (ref 6–23)
CO2: 38 mEq/L — ABNORMAL HIGH (ref 19–32)
Calcium: 8.5 mg/dL (ref 8.4–10.5)
Chloride: 98 mEq/L (ref 96–112)
Creatinine, Ser: 1.81 mg/dL — ABNORMAL HIGH (ref 0.40–1.50)
GFR: 38.12 mL/min — ABNORMAL LOW (ref >60.00)
Glucose, Bld: 92 mg/dL (ref 70–99)
Potassium: 4.4 mEq/L (ref 3.5–5.1)
Sodium: 139 mEq/L (ref 135–145)
Total Bilirubin: 0.5 mg/dL (ref 0.2–1.2)
Total Protein: 7.1 g/dL (ref 6.0–8.3)

## 2015-03-23 ENCOUNTER — Telehealth: Payer: Self-pay | Admitting: Cardiology

## 2015-03-23 NOTE — Telephone Encounter (Signed)
Pts daughter calling to inform Dr Meda Coffee that since the pt started taking Pravastatin and Plavix, the pts has had some LEE and noted horrible bruising on his left arm.  The pt complains of no sob, cp, palpitations, DOE, dizziness, or any other cardiac complaints at this time.  Pt corresponds taking both of these meds with his complaints.  Pt is not weighing himself daily.  Daughter states the pts sodium intake and water intake is horrible.  Informed the daughter that I will forward this message to Dr Meda Coffee for further review and recommendation and follow-up with her thereafter.  Daughter verbalized understanding and agrees with this plan.

## 2015-03-23 NOTE — Telephone Encounter (Signed)
Tell him to stop taking plavix, thank you

## 2015-03-23 NOTE — Telephone Encounter (Signed)
Pt c/o medication issue:  1. Name of Medication: Pravastatin 10 mg 1x per day and Clopidogrel 75 mg 1x per day  2. How are you currently taking this medication (dosage and times per day)? Above  3. Are you having a reaction (difficulty breathing--STAT)? No  4. What is your medication issue? Left arm is black looking and his feet are swelling since taking these medications.       Pt's wife calling stating that Dr. Meda Coffee dx pt with heart disease

## 2015-03-23 NOTE — Telephone Encounter (Signed)
Spoke with the pt and informed him that per Dr Meda Coffee he can stop taking Plavix now, but should continue with taking the Pravastatin.  Encouraged the pt to drink water, weigh himself daily when LEE noted, and maintain a low sodium diet to help with the LEE.  Also left detailed message on the pts spouse cell VM that per Dr Meda Coffee, he can stop Plavix.  This was requested that I place this call and leave a VM to the spouse per the pt.  Will update the pts med list.

## 2015-03-24 DIAGNOSIS — I509 Heart failure, unspecified: Secondary | ICD-10-CM | POA: Diagnosis not present

## 2015-04-04 ENCOUNTER — Other Ambulatory Visit: Payer: Self-pay | Admitting: Family Medicine

## 2015-04-04 ENCOUNTER — Other Ambulatory Visit (INDEPENDENT_AMBULATORY_CARE_PROVIDER_SITE_OTHER): Payer: Medicare Other

## 2015-04-04 DIAGNOSIS — E1140 Type 2 diabetes mellitus with diabetic neuropathy, unspecified: Secondary | ICD-10-CM | POA: Diagnosis not present

## 2015-04-04 LAB — HEMOGLOBIN A1C: Hgb A1c MFr Bld: 8.4 % — ABNORMAL HIGH (ref 4.6–6.5)

## 2015-04-07 ENCOUNTER — Ambulatory Visit (INDEPENDENT_AMBULATORY_CARE_PROVIDER_SITE_OTHER): Payer: Medicare Other | Admitting: Family Medicine

## 2015-04-07 ENCOUNTER — Encounter: Payer: Self-pay | Admitting: Family Medicine

## 2015-04-07 VITALS — BP 142/70 | HR 67 | Temp 97.5°F | Wt 216.0 lb

## 2015-04-07 DIAGNOSIS — E1140 Type 2 diabetes mellitus with diabetic neuropathy, unspecified: Secondary | ICD-10-CM | POA: Diagnosis not present

## 2015-04-07 MED ORDER — CLOPIDOGREL BISULFATE 75 MG PO TABS: 75.0000 mg | ORAL_TABLET | Freq: Every day | ORAL | Status: DC

## 2015-04-07 NOTE — Patient Instructions (Addendum)
Check on your cardiology and eye clinic appointments.   Recheck here in about 4 months, labs ahead of time.   Don't change your meds for now.  Glad to see you.

## 2015-04-07 NOTE — Progress Notes (Signed)
Pre visit review using our clinic review tool, if applicable. No additional management support is needed unless otherwise documented below in the visit note.  Diabetes:  Using medications without difficulties:yes Hypoglycemic episodes: rare, no severe episodes.   Hyperglycemic episodes: no Feet problems: tingling the feet B.  Not better with gabapentin.   Blood Sugars averaging: ~100 eye exam within last year: due, d/w pt.  A1c improved.   D/w pt.  Has cards f/u pending.   Meds, vitals, and allergies reviewed.   ROS: See HPI.  Otherwise negative.    GEN: nad, alert and oriented HEENT: mucous membranes moist NECK: supple w/o LA CV: rrr. PULM: ctab, no inc wob ABD: soft, +bs EXT: no edema  Diabetic foot exam: Normal inspection No skin breakdown No calluses  Dec DP pulses B Normal sensation to light touch and monofilament Nails normal

## 2015-04-11 NOTE — Assessment & Plan Note (Signed)
A1c improved.   D/w pt.  Best A1c he's had in about 2 years.  Goal A1c about 8, to try to prevent lows.  He agrees.   No med changes at this point. Recheck in a few months.   He has PNA vaccine done at [facility], he'll update me with the info on that.  He'll f/u with cards.  He wanted more information about his prev cards w/u and I'll defer to cards about that.   App help of all involved.

## 2015-04-14 ENCOUNTER — Encounter: Payer: Self-pay | Admitting: *Deleted

## 2015-04-14 ENCOUNTER — Ambulatory Visit (INDEPENDENT_AMBULATORY_CARE_PROVIDER_SITE_OTHER): Payer: Medicare Other | Admitting: Cardiology

## 2015-04-14 VITALS — BP 132/62 | HR 80 | Ht 74.0 in | Wt 214.0 lb

## 2015-04-14 DIAGNOSIS — R600 Localized edema: Secondary | ICD-10-CM

## 2015-04-14 DIAGNOSIS — I2583 Coronary atherosclerosis due to lipid rich plaque: Secondary | ICD-10-CM

## 2015-04-14 DIAGNOSIS — I4891 Unspecified atrial fibrillation: Secondary | ICD-10-CM

## 2015-04-14 DIAGNOSIS — I2510 Atherosclerotic heart disease of native coronary artery without angina pectoris: Secondary | ICD-10-CM

## 2015-04-14 DIAGNOSIS — E1140 Type 2 diabetes mellitus with diabetic neuropathy, unspecified: Secondary | ICD-10-CM

## 2015-04-14 DIAGNOSIS — E0842 Diabetes mellitus due to underlying condition with diabetic polyneuropathy: Secondary | ICD-10-CM | POA: Diagnosis not present

## 2015-04-14 DIAGNOSIS — I10 Essential (primary) hypertension: Secondary | ICD-10-CM | POA: Diagnosis not present

## 2015-04-14 DIAGNOSIS — I25709 Atherosclerosis of coronary artery bypass graft(s), unspecified, with unspecified angina pectoris: Secondary | ICD-10-CM

## 2015-04-14 MED ORDER — GABAPENTIN 300 MG PO CAPS: 300.0000 mg | ORAL_CAPSULE | Freq: Two times a day (BID) | ORAL | Status: DC

## 2015-04-14 NOTE — Patient Instructions (Signed)
Medication Instructions:   START TAKING GABAPENTIN 300 MG TWICE DAILY      Follow-Up:  Your physician wants you to follow-up in: [name] will receive a reminder letter in the mail two months in advance. If you don't receive a letter, please call our office to schedule the follow-up appointment.

## 2015-04-14 NOTE — Progress Notes (Signed)
Patient ID: [name] Small, male   DOB: Feb 25, 1928, 81 y.o.   MRN: [id number]      Cardiology Office Note   Date:  04/14/2015   ID:  [name] Small, DOB 28-Oct-1928, MRN [id number]  PCP:  [name], MD  Cardiologist:  Dr Verl Blalock,  Dorothy Spark, MD   Chief complain: diabetic neuropathy, SOB   History of Present Illness: [name] Small is a 81 y.o. male returns today for evaluation and management of his nonobstructive coronary artery disease, normal left ventricular function, and normal right-sided function and pressures. I saw him several years ago which time he is having significant dyspnea on exertion. He was last seen by Dr Verl Blalock 2 years ago. Since that time he feels more SOB on exertion, while playing golf, when he has to take breaks, he denies any chest pain. His main concern is pain in his legs while walking, this has been chronic but not addressed. He denies any palpitations, syncope, orthopnea, PND.   He has finished aggressive chemotherapy for invasive bladder cancer in 2012. He has substantial problems with bruising and bleeding with low-dose aspirin in the past. He would prefer not to take it.  04/14/2015 - the patient underwent a stress test for DOE, it showed mostly fixed inferior wall defect with minimal periinfarct ischemia. He was started on pravastatin that he couldn't tolerate and Plavix as he is allergic to aspirin.  He is doing ok, denies chest pain, has exertional SOB that is stable, he walks minimally sec to diabetic neuropathy, no palpitations or syncope.    Past Medical History  Diagnosis Date  . BENIGN PROSTATIC HYPERTROPHY 05/21/2007  . DIABETES MELLITUS, TYPE II 05/21/2007  . DIVERTICULOSIS, COLON 05/21/2007    pt denies  . HYPERTENSION 05/21/2007  . PSORIASIS, SCALP 10/25/2008  . Cellulitis of left foot     Hospitalized in 2006  . Gross hematuria     pt denies  . Psoriasis ELBOWS  . Fatigue AGE-RELATED  . Arthritis   . CAD (coronary artery disease)  CARDIOLOGIST- DR WALL - VISIT IN JUNE 2012 FOR CATH    nonobstructive by cath 6/12:  mid LAD 30%, proximal obtuse marginal-2 30%, proximal RCA 20%, mid RCA 30-40%.  He had normal cardiac output and mildly elevated filling pressures but no significant pulmonary hypertension;   Echocardiogram in May 2012 demonstrated EF 50-55% and left atrial enlargement   . Impaired hearing BILATERAL HEARING AIDS  . Bladder cancer     2013  . Chronic kidney disease (CKD), stage III (moderate) 12/04/2007    FOLLOWED BY DR PATEL  . Back pain     s/p lumbar injection 2014    Past Surgical History  Procedure Laterality Date  . Vasectomy  15 YRS AGO  W/ ANES.  Marland Kitchen Penile prosthesis implant  15 YRS AGO  . Cardiovascular stress test  2007  . Cardiac catheterization  04-24-11/  DR MUHAMMAD ARIDA    MILD NONOBSTRUCTIVE CAD, NORMA CARDIAC OUTPUT  . Appendectomy  AGE 43  . Tonsillectomy and adenoidectomy  AGE 23 YRS AGO  . Cataract extraction w/ intraocular lens  implant, bilateral    . Incision and drainage foot  LEFT FOOT DUE TO INFECTION FROM  NAIL PUNCTURE INJURY  . Cystoscopy w/ retrogrades  10/30/2011    Procedure: CYSTOSCOPY WITH RETROGRADE PYELOGRAM;  Surgeon: Molli Hazard, MD;  Location: [facility];  Service: Urology;  Laterality: Bilateral;  CYSTOSCOPY POSS TURBT BILATERAL RETROGRADE PYLEOGRAM POSS BILATERAL  URETEROSCOPY WITH BIOPSY LASER ABLATION OF LESION POSS BILATERAL URETERAL STENT PLACEMENT   CARM FLEXIBLE URETERAL SCOPE DIGITAL URETERAL SCOPE PK GYRUS HO  . Transurethral resection of bladder tumor  10/30/2011    Procedure: TRANSURETHRAL RESECTION OF BLADDER TUMOR (TURBT);  Surgeon: Molli Hazard, MD;  Location: [facility];  Service: Urology;  Laterality: N/A;  . Cystoscopy  12/25/2011    Procedure: CYSTOSCOPY;  Surgeon: Molli Hazard, MD;  Location: [facility];  Service: Urology;  Laterality: N/A;  needs intubation and to be  paralyzed   . Transurethral resection of bladder tumor  12/25/2011    Procedure: TRANSURETHRAL RESECTION OF BLADDER TUMOR (TURBT);  Surgeon: Molli Hazard, MD;  Location: [facility];  Service: Urology;  Laterality: N/A;  need long gyrus instruments Dr states he has spoken w/ Nelwyn Salisbury regarding this    . Cystoscopy w/ retrogrades  11/30/2012    Procedure: CYSTOSCOPY WITH RETROGRADE PYELOGRAM;  Surgeon: Molli Hazard, MD;  Location: [facility];  Service: Urology;  Laterality: Bilateral;  Flexible cystoscopy.  . Cystoscopy with biopsy  11/30/2012    Procedure: CYSTOSCOPY WITH BIOPSY;  Surgeon: Molli Hazard, MD;  Location: [facility];  Service: Urology;  Laterality: N/A;     Current Outpatient Prescriptions  Medication Sig Dispense Refill  . amLODipine (NORVASC) 5 MG tablet Take 1 tablet (5 mg total) by mouth every morning. 90 tablet 3  . budesonide-formoterol (SYMBICORT) 160-4.5 MCG/ACT inhaler Inhale 2 puffs into the lungs 2 (two) times daily as needed.    . clobetasol (TEMOVATE) 0.05 % external solution Apply 1 application topically daily.    . clopidogrel (PLAVIX) 75 MG tablet Take 1 tablet (75 mg total) by mouth daily. 90 tablet 3  . furosemide (LASIX) 40 MG tablet Take 1 tablet (40 mg total) by mouth every Monday, Wednesday, and Friday. 45 tablet 3  . glipiZIDE (GLUCOTROL XL) 5 MG 24 hr tablet Take 1 tablet (5 mg total) by mouth daily with breakfast. 90 tablet 3  . insulin glargine (LANTUS) 100 UNIT/ML injection Inject 25 Units into the skin daily at 3 pm.     No current facility-administered medications for this visit.    Allergies:   Actos; Aspirin; Ace inhibitors; Pravastatin; Bactrim; and Sulfa drugs cross reactors    Social History:  The patient  reports that he quit smoking about 46 years ago. His smoking use included Cigarettes. He has a 44 pack-year smoking history. He has never used smokeless tobacco. He  reports that he drinks about 1.2 oz of alcohol per week. He reports that he does not use illicit drugs.   Family History:  The patient's family history includes Diabetes in his mother; Heart disease in his brother, brother, brother, brother, brother, and brother; Lung cancer in his brother.    ROS:  Please see the history of present illness.    All other systems are reviewed and negative.    PHYSICAL EXAM: VS:  BP 132/62 mmHg  Pulse 80  Ht 6\' 2"  (1.88 m)  Wt 214 lb (97.07 kg)  BMI 27.46 kg/m2 , BMI Body mass index is 27.46 kg/(m^2). GEN: Well nourished, well developed, in no acute distress HEENT: normal Neck: no JVD, carotid bruits, or masses Cardiac: RRR; no murmurs, rubs, or gallops,no edema  Respiratory:  clear to auscultation bilaterally, normal work of breathing GI: soft, nontender, nondistended, + BS LE: changes of chronic venous stasis, mild B/L perimalleolar edema, poorly  palpable pulses on DP/PT B/L MS: no deformity or atrophy Skin: warm and dry, no rash Neuro:  Strength and sensation are intact Psych: euthymic mood, full affect   EKG:  EKG is ordered today. The ekg ordered today demonstrates SR, Normal ECG   Recent Labs: 03/14/2015: ALT 8; BUN 27*; Creatinine 1.81*; Potassium 4.4; Sodium 139    Lipid Panel    Component Value Date/Time   CHOL 161 11/29/2014 0922   TRIG 151.0* 11/29/2014 0922   HDL 35.90* 11/29/2014 0922   CHOLHDL 4 11/29/2014 0922   VLDL 30.2 11/29/2014 0922   LDLCALC 95 11/29/2014 0922   LDLDIRECT 147.6 04/27/2009 1012      Wt Readings from Last 3 Encounters:  04/14/15 214 lb (97.07 kg)  04/07/15 216 lb (97.977 kg)  02/09/15 217 lb (98.431 kg)    R+L cath: 04/25/2011 PROCEDURES/DIAGNOSTICS PERFORMED DURING HOSPITALIZATION: 1. Right and left heart catheterization with coronary angiography.  a. Mild nonobstructive coronary artery disease. 2. Mildly elevated filling pressures. Left ventricular end-diastolic  pressure of 13,  pulmonary capillary wedge pressure 16. RV 35/7.  Other studies Reviewed: Additional studies/ records that were reviewed today include: cath. Review of the above records demonstrates: as above  Lexiscan stress test: Overall Impression:  Low risk stress nuclear study with a mostly fixed inferior wall defect with minimal periinfarct ischemia. A component of diaphragmatic attenuation artifact is also likely present.  LV Ejection Fraction: 51%.  LV Wall Motion:  Borderline LV systolic function. Mild inferior wall hypokinesis.   ECG: SR, non-specific changes in the lateral leads    ASSESSMENT AND PLAN:  81 year old male  1. CAD, minimally abnormal stress test, started on Plavix, couldn't tolerate statins  2. HTN - controlled  3. Diabetic neuropathy - start gabapentin 300 mg po BID  4. Hyperlipidemia - he couldn't tolerate statins, we will continue lifestyle modifications only  5. LE edema - he states that only at night, currently on lasix 40 mg po 3x/week, he is advised to take an extra pill if he develops more swelling   Disposition:   FU with Ena Dawley H  In 6 months   Signed, Dorothy Spark, MD  04/14/2015 3:58 PM    [address], [place], [place]  [address] Phone: [phone]; Fax: [phone]

## 2015-04-19 DIAGNOSIS — E119 Type 2 diabetes mellitus without complications: Secondary | ICD-10-CM | POA: Diagnosis not present

## 2015-04-24 DIAGNOSIS — I509 Heart failure, unspecified: Secondary | ICD-10-CM | POA: Diagnosis not present

## 2015-04-26 ENCOUNTER — Telehealth: Payer: Self-pay | Admitting: Cardiology

## 2015-04-26 MED ORDER — PREGABALIN 25 MG PO CAPS: 25.0000 mg | ORAL_CAPSULE | Freq: Every day | ORAL | Status: DC

## 2015-04-26 NOTE — Telephone Encounter (Signed)
I would prescribe Lyrica 25 mg po daily that is better tolerated but might not be covered by his insurance. KN

## 2015-04-26 NOTE — Telephone Encounter (Signed)
Pt and wife calling to inform Dr Meda Coffee that since the pt started taking Gabapentin 300 mg on 6/6, it has caused him stomach pain and diarrhea.  Both parties state that the pt is stopping the medication as of today.  Both parties state that they wanted to let Dr Meda Coffee know this, encase she has any other suggestions.  Both state if there is no other medication substitute then that's fine.  Informed the pt and wife that I will notify Dr Meda Coffee of this and follow-up with any recommendations.  Both verbalized understanding and agrees with this plan.

## 2015-04-26 NOTE — Telephone Encounter (Signed)
Notified the pt to inform him that per Dr Meda Coffee she will discontinue gabapentin and prescribe Lyrica 25 mg po daily, for that is better tolerated but might not be covered by his insurance plan.  Confirmed the pharmacy of choice with the pt.  Pt prefers [facility] on [address]  Marykay Lex new prescription of Lyrica 25 mg po daily to pts preferred pharmacy.  Updated Gabapentin in pts allergies.  Pt verbalized understanding, agrees with this plan, and gracious for all the assistance provided.

## 2015-04-26 NOTE — Telephone Encounter (Signed)
New message      Pt c/o medication issue:  1. Name of Medication: gabapention  2. How are you currently taking this medication (dosage and times per day)? 300mg  bid 3. Are you having a reaction (difficulty breathing--STAT)? no 4. What is your medication issue? Pt has had diarrhea and he is weak since starting medication

## 2015-05-24 DIAGNOSIS — I509 Heart failure, unspecified: Secondary | ICD-10-CM | POA: Diagnosis not present

## 2015-06-08 DIAGNOSIS — Z961 Presence of intraocular lens: Secondary | ICD-10-CM | POA: Diagnosis not present

## 2015-06-08 DIAGNOSIS — H35373 Puckering of macula, bilateral: Secondary | ICD-10-CM | POA: Diagnosis not present

## 2015-06-08 DIAGNOSIS — E11329 Type 2 diabetes mellitus with mild nonproliferative diabetic retinopathy without macular edema: Secondary | ICD-10-CM | POA: Diagnosis not present

## 2015-06-08 LAB — HM DIABETES EYE EXAM

## 2015-06-15 ENCOUNTER — Encounter: Payer: Self-pay | Admitting: Family Medicine

## 2015-06-24 DIAGNOSIS — I509 Heart failure, unspecified: Secondary | ICD-10-CM | POA: Diagnosis not present

## 2015-07-18 ENCOUNTER — Ambulatory Visit (INDEPENDENT_AMBULATORY_CARE_PROVIDER_SITE_OTHER): Payer: Medicare Other | Admitting: Family Medicine

## 2015-07-18 ENCOUNTER — Encounter: Payer: Self-pay | Admitting: Family Medicine

## 2015-07-18 VITALS — BP 140/60 | HR 72 | Temp 97.8°F | Wt 218.0 lb

## 2015-07-18 DIAGNOSIS — Z23 Encounter for immunization: Secondary | ICD-10-CM | POA: Diagnosis not present

## 2015-07-18 DIAGNOSIS — R609 Edema, unspecified: Secondary | ICD-10-CM

## 2015-07-18 NOTE — Progress Notes (Signed)
Pre visit review using our clinic review tool, if applicable. No additional management support is needed unless otherwise documented below in the visit note.  No new meds in the last week.  Has been off gabapentin and lyrica for at least 1 month, so not related to current issues.   More edema in the last week.  No salt loading in the last week.  Has been taking lasix MWF at baseline.    No CP.  Some SOB, at baseline, not changed.   Sugar has been controlled usually and weight is near baseline.   Dec swelling in AM, worse later in the day.  Sleeping on 1 pillow.  He still feels good. Going to [place] next week.    Per patient, had PNA 13 at [place] in 01/2015.   Meds, vitals, and allergies reviewed.   ROS: See HPI.  Otherwise, noncontributory.  GEN: nad, alert and oriented HEENT: mucous membranes moist NECK: supple w/o LA CV: rrr.  PULM: ctab, no inc wob ABD: soft, +bs EXT: 1+ edema SKIN: no acute rash

## 2015-07-18 NOTE — Patient Instructions (Signed)
Go to the lab on the way out.  We'll contact you with your lab report. Take your lasix tomorrow.  We'll be in touch.  Take care.  Glad to see you.

## 2015-07-19 DIAGNOSIS — R609 Edema, unspecified: Secondary | ICD-10-CM | POA: Insufficient documentation

## 2015-07-19 LAB — BASIC METABOLIC PANEL
BUN: 37 mg/dL — ABNORMAL HIGH (ref 6–23)
CO2: 34 mEq/L — ABNORMAL HIGH (ref 19–32)
Calcium: 8.9 mg/dL (ref 8.4–10.5)
Chloride: 97 mEq/L (ref 96–112)
Creatinine, Ser: 2.03 mg/dL — ABNORMAL HIGH (ref 0.40–1.50)
GFR: 33.37 mL/min — ABNORMAL LOW (ref >60.00)
Glucose, Bld: 279 mg/dL — ABNORMAL HIGH (ref 70–99)
Potassium: 4.8 mEq/L (ref 3.5–5.1)
Sodium: 138 mEq/L (ref 135–145)

## 2015-07-19 NOTE — Assessment & Plan Note (Signed)
Lungs still clear, no CP, not more SOB than his prev baseline.   No clear trigger known.  Still appears okay for outpatient f/u.  Check bmet today, may need extra dose of lasix in meantime.  He'll update me.  Continue with salt restriction on his diet.

## 2015-07-25 DIAGNOSIS — I509 Heart failure, unspecified: Secondary | ICD-10-CM | POA: Diagnosis not present

## 2015-07-31 ENCOUNTER — Other Ambulatory Visit: Payer: Self-pay | Admitting: Family Medicine

## 2015-07-31 DIAGNOSIS — E1140 Type 2 diabetes mellitus with diabetic neuropathy, unspecified: Secondary | ICD-10-CM

## 2015-08-02 ENCOUNTER — Other Ambulatory Visit (INDEPENDENT_AMBULATORY_CARE_PROVIDER_SITE_OTHER): Payer: Medicare Other

## 2015-08-02 DIAGNOSIS — E1140 Type 2 diabetes mellitus with diabetic neuropathy, unspecified: Secondary | ICD-10-CM | POA: Diagnosis not present

## 2015-08-02 LAB — BASIC METABOLIC PANEL
BUN: 31 mg/dL — ABNORMAL HIGH (ref 6–23)
CO2: 36 mEq/L — ABNORMAL HIGH (ref 19–32)
Calcium: 9.1 mg/dL (ref 8.4–10.5)
Chloride: 99 mEq/L (ref 96–112)
Creatinine, Ser: 1.83 mg/dL — ABNORMAL HIGH (ref 0.40–1.50)
GFR: 37.6 mL/min — ABNORMAL LOW (ref >60.00)
Glucose, Bld: 151 mg/dL — ABNORMAL HIGH (ref 70–99)
Potassium: 4.9 mEq/L (ref 3.5–5.1)
Sodium: 140 mEq/L (ref 135–145)

## 2015-08-02 LAB — HEMOGLOBIN A1C: Hgb A1c MFr Bld: 9.3 % — ABNORMAL HIGH (ref 4.6–6.5)

## 2015-08-10 ENCOUNTER — Encounter: Payer: Self-pay | Admitting: Family Medicine

## 2015-08-10 ENCOUNTER — Ambulatory Visit (INDEPENDENT_AMBULATORY_CARE_PROVIDER_SITE_OTHER): Payer: Medicare Other | Admitting: Family Medicine

## 2015-08-10 VITALS — BP 140/58 | HR 78 | Wt 214.0 lb

## 2015-08-10 DIAGNOSIS — E1140 Type 2 diabetes mellitus with diabetic neuropathy, unspecified: Secondary | ICD-10-CM

## 2015-08-10 NOTE — Progress Notes (Signed)
His edema is resolved.  It was possibly due to diet, in retrospect.  Back on MWF lasix, baseline.   He saw cards in summer 2016.  He denies chest pain, has exertional SOB that is stable.    Diabetes:  Using medications without difficulties:  See below.   Hypoglycemic episodes: rare lows Hyperglycemic episodes:  Feet problems: at baseline, some tingling.  Blood Sugars averaging: usually ~115 in AMs, depending on diet.   eye exam within last year: yes A1c up, d/w pt.   Diet d/w pt.    Meds, vitals, and allergies reviewed.   ROS: See HPI.  Otherwise negative.    GEN: nad, alert and oriented HEENT: mucous membranes moist NECK: supple w/o LA CV: rrr. PULM: ctab, no inc wob ABD: soft, +bs EXT: no edema SKIN: no acute rash

## 2015-08-10 NOTE — Patient Instructions (Signed)
Recheck labs in about 3 months before a visit.   Take care.  Glad to see you.  Don't change your meds for now, with the exception of your insulin- 1 unit per day if needed.  Work on [employer] in the meantime.

## 2015-08-11 NOTE — Assessment & Plan Note (Signed)
At this point, needs diet work more than med change. D/w pt.  Recheck A1c in about 3 months.  He agrees.

## 2015-08-24 DIAGNOSIS — I509 Heart failure, unspecified: Secondary | ICD-10-CM | POA: Diagnosis not present

## 2015-09-07 DIAGNOSIS — E119 Type 2 diabetes mellitus without complications: Secondary | ICD-10-CM | POA: Diagnosis not present

## 2015-09-22 ENCOUNTER — Ambulatory Visit (INDEPENDENT_AMBULATORY_CARE_PROVIDER_SITE_OTHER): Payer: Medicare Other | Admitting: Cardiology

## 2015-09-22 ENCOUNTER — Encounter: Payer: Self-pay | Admitting: Cardiology

## 2015-09-22 VITALS — BP 110/70 | HR 69 | Ht 74.0 in | Wt 215.2 lb

## 2015-09-22 DIAGNOSIS — E785 Hyperlipidemia, unspecified: Secondary | ICD-10-CM | POA: Diagnosis not present

## 2015-09-22 DIAGNOSIS — I10 Essential (primary) hypertension: Secondary | ICD-10-CM | POA: Diagnosis not present

## 2015-09-22 DIAGNOSIS — R600 Localized edema: Secondary | ICD-10-CM

## 2015-09-22 DIAGNOSIS — I2510 Atherosclerotic heart disease of native coronary artery without angina pectoris: Secondary | ICD-10-CM | POA: Diagnosis not present

## 2015-09-22 MED ORDER — AMLODIPINE BESYLATE 2.5 MG PO TABS: 2.5000 mg | ORAL_TABLET | Freq: Every day | ORAL | Status: DC

## 2015-09-22 NOTE — Patient Instructions (Signed)
Medication Instructions:  Decrease norvasc (amlodipine) to 2.5mg  daily   Labwork: None today  Testing/Procedures: None.  Follow-Up: Your physician wants you to follow-up in: 6 months with Dr Meda Coffee. (May 2017).  You will receive a reminder letter in the mail two months in advance. If you don't receive a letter, please call our office to schedule the follow-up appointment.        If you need a refill on your cardiac medications before your next appointment, please call your pharmacy.

## 2015-09-22 NOTE — Progress Notes (Signed)
Patient ID: [name] Small, male   DOB: Jan 06, 1928, 81 y.o.   MRN: [id number]      Cardiology Office Note   Date:  09/22/2015   ID:  [name] Small, DOB 22-Oct-1928, MRN [id number]  PCP:  [name], MD  Cardiologist:  Dr Verl Blalock,  Dorothy Spark, MD   Chief complain: diabetic neuropathy, SOB   History of Present Illness: [name] Small is a 81 y.o. male returns today for evaluation and management of his nonobstructive coronary artery disease, normal left ventricular function, and normal right-sided function and pressures. I saw him several years ago which time he is having significant dyspnea on exertion. He was last seen by Dr Verl Blalock 2 years ago. Since that time he feels more SOB on exertion, while playing golf, when he has to take breaks, he denies any chest pain. His main concern is pain in his legs while walking, this has been chronic but not addressed. He denies any palpitations, syncope, orthopnea, PND.   He has finished aggressive chemotherapy for invasive bladder cancer in 2012. He has substantial problems with bruising and bleeding with low-dose aspirin in the past. He would prefer not to take it.  09/22/2015 - the patient underwent a stress test for DOE in April 2016, it showed mostly fixed inferior wall defect with minimal periinfarct ischemia. He was started on pravastatin that he couldn't tolerate and Plavix as he is allergic to aspirin.  He is doing ok, denies chest pain, has exertional SOB that is stable, he walks minimally sec to diabetic neuropathy, no palpitations or syncope. We tried gabapentin that he couldn't tolerate and lyrica that was too expensive. He lives good life, enjoys going out with his wife and playing golf once a week.  No falls.    Past Medical History  Diagnosis Date  . BENIGN PROSTATIC HYPERTROPHY 05/21/2007  . DIABETES MELLITUS, TYPE II 05/21/2007  . DIVERTICULOSIS, COLON 05/21/2007    pt denies  . HYPERTENSION 05/21/2007  . PSORIASIS, SCALP  10/25/2008  . Cellulitis of left foot     Hospitalized in 2006  . Gross hematuria     pt denies  . Psoriasis ELBOWS  . Fatigue AGE-RELATED  . Arthritis   . CAD (coronary artery disease) CARDIOLOGIST- DR WALL - VISIT IN JUNE 2012 FOR CATH    nonobstructive by cath 6/12:  mid LAD 30%, proximal obtuse marginal-2 30%, proximal RCA 20%, mid RCA 30-40%.  He had normal cardiac output and mildly elevated filling pressures but no significant pulmonary hypertension;   Echocardiogram in May 2012 demonstrated EF 50-55% and left atrial enlargement   . Impaired hearing BILATERAL HEARING AIDS  . Bladder cancer ([place])     2013  . Chronic kidney disease (CKD), stage III (moderate) 12/04/2007    FOLLOWED BY DR PATEL  . Back pain     s/p lumbar injection 2014    Past Surgical History  Procedure Laterality Date  . Vasectomy  15 YRS AGO  W/ ANES.  Marland Kitchen Penile prosthesis implant  15 YRS AGO  . Cardiovascular stress test  2007  . Cardiac catheterization  04-24-11/  DR MUHAMMAD ARIDA    MILD NONOBSTRUCTIVE CAD, NORMA CARDIAC OUTPUT  . Appendectomy  AGE 39  . Tonsillectomy and adenoidectomy  AGE 59 YRS AGO  . Cataract extraction w/ intraocular lens  implant, bilateral    . Incision and drainage foot  LEFT FOOT DUE TO INFECTION FROM  NAIL PUNCTURE INJURY  . Cystoscopy w/ retrogrades  10/30/2011    Procedure: CYSTOSCOPY WITH RETROGRADE PYELOGRAM;  Surgeon: Molli Hazard, MD;  Location: [facility];  Service: Urology;  Laterality: Bilateral;  CYSTOSCOPY POSS TURBT BILATERAL RETROGRADE PYLEOGRAM POSS BILATERAL URETEROSCOPY WITH BIOPSY LASER ABLATION OF LESION POSS BILATERAL URETERAL STENT PLACEMENT   CARM FLEXIBLE URETERAL SCOPE DIGITAL URETERAL SCOPE PK GYRUS HO  . Transurethral resection of bladder tumor  10/30/2011    Procedure: TRANSURETHRAL RESECTION OF BLADDER TUMOR (TURBT);  Surgeon: Molli Hazard, MD;  Location: [facility];  Service: Urology;   Laterality: N/A;  . Cystoscopy  12/25/2011    Procedure: CYSTOSCOPY;  Surgeon: Molli Hazard, MD;  Location: [facility];  Service: Urology;  Laterality: N/A;  needs intubation and to be paralyzed   . Transurethral resection of bladder tumor  12/25/2011    Procedure: TRANSURETHRAL RESECTION OF BLADDER TUMOR (TURBT);  Surgeon: Molli Hazard, MD;  Location: [facility];  Service: Urology;  Laterality: N/A;  need long gyrus instruments Dr states he has spoken w/ Nelwyn Salisbury regarding this    . Cystoscopy w/ retrogrades  11/30/2012    Procedure: CYSTOSCOPY WITH RETROGRADE PYELOGRAM;  Surgeon: Molli Hazard, MD;  Location: [facility];  Service: Urology;  Laterality: Bilateral;  Flexible cystoscopy.  . Cystoscopy with biopsy  11/30/2012    Procedure: CYSTOSCOPY WITH BIOPSY;  Surgeon: Molli Hazard, MD;  Location: [facility];  Service: Urology;  Laterality: N/A;     Current Outpatient Prescriptions  Medication Sig Dispense Refill  . amLODipine (NORVASC) 5 MG tablet Take 1 tablet (5 mg total) by mouth every morning. 90 tablet 3  . budesonide-formoterol (SYMBICORT) 160-4.5 MCG/ACT inhaler Inhale 2 puffs into the lungs 2 (two) times daily as needed.    . clobetasol (TEMOVATE) 0.05 % external solution Apply 1 application topically daily.    . clopidogrel (PLAVIX) 75 MG tablet Take 1 tablet (75 mg total) by mouth daily. 90 tablet 3  . furosemide (LASIX) 40 MG tablet Take 1 tablet (40 mg total) by mouth every Monday, Wednesday, and Friday. 45 tablet 3  . glipiZIDE (GLUCOTROL XL) 5 MG 24 hr tablet Take 1 tablet (5 mg total) by mouth daily with breakfast. 90 tablet 3  . insulin glargine (LANTUS) 100 UNIT/ML injection Inject 25 Units into the skin daily at 3 pm.     No current facility-administered medications for this visit.    Allergies:   Actos; Aspirin; Ace inhibitors; Gabapentin; Lyrica; Pravastatin; Bactrim; and  Sulfa drugs cross reactors    Social History:  The patient  reports that he quit smoking about 46 years ago. His smoking use included Cigarettes. He has a 44 pack-year smoking history. He has never used smokeless tobacco. He reports that he drinks about 1.2 oz of alcohol per week. He reports that he does not use illicit drugs.   Family History:  The patient's family history includes Diabetes in his mother; Heart disease in his brother, brother, brother, brother, brother, and brother; Lung cancer in his brother.    ROS:  Please see the history of present illness.    All other systems are reviewed and negative.    PHYSICAL EXAM: VS:  BP 110/70 mmHg  Pulse 69  Ht 6\' 2"  (1.88 m)  Wt 215 lb 3.2 oz (97.614 kg)  BMI 27.62 kg/m2 , BMI Body mass index is 27.62 kg/(m^2). GEN: Well nourished, well developed, in no acute distress HEENT: normal Neck: no JVD,  carotid bruits, or masses Cardiac: RRR; no murmurs, rubs, or gallops,no edema  Respiratory:  clear to auscultation bilaterally, normal work of breathing GI: soft, nontender, nondistended, + BS LE: changes of chronic venous stasis, mild B/L perimalleolar edema, poorly palpable pulses on DP/PT B/L MS: no deformity or atrophy Skin: warm and dry, no rash Neuro:  Strength and sensation are intact Psych: euthymic mood, full affect   EKG:  EKG is ordered today. The ekg ordered today demonstrates SR, Normal ECG   Recent Labs: 03/14/2015: ALT 8 08/02/2015: BUN 31*; Creatinine, Ser 1.83*; Potassium 4.9; Sodium 140    Lipid Panel    Component Value Date/Time   CHOL 161 11/29/2014 0922   TRIG 151.0* 11/29/2014 0922   HDL 35.90* 11/29/2014 0922   CHOLHDL 4 11/29/2014 0922   VLDL 30.2 11/29/2014 0922   LDLCALC 95 11/29/2014 0922   LDLDIRECT 147.6 04/27/2009 1012      Wt Readings from Last 3 Encounters:  09/22/15 215 lb 3.2 oz (97.614 kg)  08/10/15 214 lb (97.07 kg)  07/18/15 218 lb (98.884 kg)    R+L cath:  04/25/2011 PROCEDURES/DIAGNOSTICS PERFORMED DURING HOSPITALIZATION: 1. Right and left heart catheterization with coronary angiography.  a. Mild nonobstructive coronary artery disease. 2. Mildly elevated filling pressures. Left ventricular end-diastolic  pressure of 13, pulmonary capillary wedge pressure 16. RV 35/7.  Other studies Reviewed: Additional studies/ records that were reviewed today include: cath. Review of the above records demonstrates: as above  Lexiscan stress test: Overall Impression:  Low risk stress nuclear study with a mostly fixed inferior wall defect with minimal periinfarct ischemia. A component of diaphragmatic attenuation artifact is also likely present.  LV Ejection Fraction: 51%.  LV Wall Motion:  Borderline LV systolic function. Mild inferior wall hypokinesis.   ECG: SR, non-specific changes in the lateral leads    ASSESSMENT AND PLAN:  81 year old male  1. CAD, minimally abnormal stress test, started on Plavix, couldn't tolerate statins  2. HTN - controlled, however too low for his age, he feels tired after walking short distances, we will decrease amlodipine dose to 2.5 mg po daily.  3. Diabetic neuropathy - as above, unable to afford lyrica  4. Hyperlipidemia - he couldn't tolerate statins, we will continue lifestyle modifications only  5. LE edema - he states that only at night, currently on lasix 40 mg po 3x/week, he is advised to take an extra pill if he develops more swelling  The patient was given a form for handicap driver.   Disposition:   FU with Ena Dawley H  In 6 months   Signed, Dorothy Spark, MD  09/22/2015 9:10 AM    [facility] [address], [place], [name]  [address] Phone: [phone]; Fax: [phone]

## 2015-09-24 DIAGNOSIS — I509 Heart failure, unspecified: Secondary | ICD-10-CM | POA: Diagnosis not present

## 2015-09-28 ENCOUNTER — Telehealth: Payer: Self-pay

## 2015-09-28 ENCOUNTER — Encounter (HOSPITAL_COMMUNITY): Payer: Self-pay | Admitting: Cardiology

## 2015-09-28 DIAGNOSIS — R936 Abnormal findings on diagnostic imaging of limbs: Secondary | ICD-10-CM

## 2015-09-28 NOTE — Telephone Encounter (Signed)
Called and talked to Patient's wife [name] (DPR). Per Dr. Meda Coffee, Abnormal LE Duplex, please refer him to vascular surgery. Patient's wife is concerned about the results and wants to know why results are being given now, when test was back in March. Will forward to Dr. Meda Coffee for advisement. Will put in order for referral.

## 2015-09-28 NOTE — Progress Notes (Signed)
I dont know answer to that question, the result appeared in my in basket today, maybe Epic failure?

## 2015-09-29 NOTE — Telephone Encounter (Signed)
Missy, This result appeared in my in basket now, but it was done in March 2016. Do you when where the problem could happen?   Thank you, Houston Siren

## 2015-10-03 ENCOUNTER — Telehealth: Payer: Self-pay | Admitting: *Deleted

## 2015-10-03 NOTE — Telephone Encounter (Signed)
-----   Message from Rosalyn Gess sent at 09/29/2015  4:57 PM EST ----- Regarding: FW: Referral for PAD [name] Small  This message was sent to scheduling. I spoke w/ the wife of the patient and she wanted to speak w/ you before making the appointment. I also spoke w/ Pam and she said the message was probably sent prematurely and it'll probably be Dr Francesca Oman final decision anyway. Let me know if i can do anything. Thanks  Elberta Fortis   ----- Message -----    From: Michaelyn Barter, RN    Sent: 09/29/2015   4:41 PM      To: Rebeca Alert [address] Scheduling Subject: FW: Referral for PAD                           Can you please try to call and schedule an appointment with Dr. Fletcher Anon?    ----- Message -----    From: Audree Bane    Sent: 09/29/2015   4:31 PM      To: Michaelyn Barter, RN Subject: FW: Referral for PAD                           Pam---can you see that this patient gets scheduled for Dr. Fletcher Anon, instead of the vascular surgery office? Also, I've tried to call all day today, and they are not answering... Missy ----- Message -----    From: Dorothy Spark, MD    Sent: 09/29/2015  11:39 AM      To: Melissa C Church Subject: RE: Referral for PAD                           Dr Fletcher Anon, thank you  ----- Message -----    From: Audree Bane    Sent: 09/29/2015   8:24 AM      To: Dorothy Spark, MD Subject: Referral for PAD                               Dr. Meda Coffee, Could this patient be seen by Dr. Fletcher Anon or Gwenlyn Found, rather than vascular surgery referral?  There is only mild disease. Missy

## 2015-10-03 NOTE — Telephone Encounter (Signed)
Spoke with the pt and wife in regards to delayed results of the pts lower extremity arterial duplex. Informed the pt and wife that per Dr Meda Coffee, his LE Duplex was abnormal, and she recommends that the pt be referred to Dr Fletcher Anon in [facility] at our [facility] office.  Was very apologetic for delayed results from our office, for we were transitioning to a new system "cupid" at that time.  Informed the pt and wife that more than likely the results were not scanned in the system, because of our transition of our computer system and how we order these types of tests.  Informed both the pt and the wife that I will send our [facility] schedulers a message to call the pt and wife back to have this appt scheduled with Dr Fletcher Anon at our [facility] for a PV consult.  Both verbalized understanding and agrees with this plan.

## 2015-10-10 ENCOUNTER — Other Ambulatory Visit: Payer: Self-pay

## 2015-10-10 DIAGNOSIS — R739 Hyperglycemia, unspecified: Secondary | ICD-10-CM

## 2015-10-11 ENCOUNTER — Other Ambulatory Visit (INDEPENDENT_AMBULATORY_CARE_PROVIDER_SITE_OTHER): Payer: Medicare Other

## 2015-10-11 DIAGNOSIS — R7309 Other abnormal glucose: Secondary | ICD-10-CM | POA: Diagnosis not present

## 2015-10-11 DIAGNOSIS — R739 Hyperglycemia, unspecified: Secondary | ICD-10-CM

## 2015-10-11 LAB — MICROALBUMIN / CREATININE URINE RATIO
Creatinine,U: 52 mg/dL
Microalb Creat Ratio: 2.1 mg/g (ref 0.0–30.0)
Microalb, Ur: 1.1 mg/dL (ref 0.0–1.9)

## 2015-10-17 ENCOUNTER — Ambulatory Visit (INDEPENDENT_AMBULATORY_CARE_PROVIDER_SITE_OTHER): Payer: Medicare Other | Admitting: Cardiovascular Disease

## 2015-10-17 ENCOUNTER — Encounter: Payer: Self-pay | Admitting: Cardiovascular Disease

## 2015-10-17 VITALS — BP 138/82 | HR 78 | Ht 74.0 in | Wt 217.0 lb

## 2015-10-17 DIAGNOSIS — I739 Peripheral vascular disease, unspecified: Secondary | ICD-10-CM | POA: Diagnosis not present

## 2015-10-17 DIAGNOSIS — E785 Hyperlipidemia, unspecified: Secondary | ICD-10-CM

## 2015-10-17 MED ORDER — ATORVASTATIN CALCIUM 10 MG PO TABS: 10.0000 mg | ORAL_TABLET | Freq: Every day | ORAL | Status: DC

## 2015-10-17 MED ORDER — CILOSTAZOL 50 MG PO TABS: 50.0000 mg | ORAL_TABLET | Freq: Two times a day (BID) | ORAL | Status: DC

## 2015-10-17 NOTE — Assessment & Plan Note (Signed)
Given the presence of mild nonobstructive coronary artery disease, diabetes and most recently peripheral arterial disease, I think the patient would benefit from being on a statin in spite of his cholesterol being not significantly elevated. He did have myalgia the past with pravastatin. I am going to attempt a small dose atorvastatin 10 mg daily. Check fasting lipid and liver profile in 6 weeks.

## 2015-10-17 NOTE — Progress Notes (Signed)
Primary care physician: Dr. Damita Dunnings Primary cardiologist: Dr. Meda Coffee  HPI  This is a pleasant 81 year old male who was referred by Dr. Meda Coffee for evaluation of peripheral arterial disease. The patient has known history of mild nonobstructive coronary artery disease, hypertension and diabetes. He quit smoking more than 40 years ago. He had bladder cancer in 2012 treated with chemotherapy. He complains of bilateral calf and feet pain which is equal in both sides and happen at rest as well as with activities. The pain definitely worse and with walking. This leg pain forces him to stop but he is also limited by shortness of breath. He describes bilateral foot numbness at rest. He had noninvasive vascular evaluation in March of this year which showed an ABI of 1.0 on the right and 0.71 on the left. There was mild nonobstructive bilateral SFA disease. There was one-vessel runoff bilaterally below the knee. The patient has no lower extremity ulceration.  Allergies  Allergen Reactions  . Actos [Pioglitazone Hydrochloride] Other (See Comments)    Held 2012 bladder cancer  . Aspirin Other (See Comments)    Held 2012 due to hematuria and bruising.   . Ace Inhibitors Cough  . Gabapentin     Upset stomach and diarrhea  . Lyrica [Pregabalin] Other (See Comments)    swelling  . Pravastatin Other (See Comments)    Swelling of feet  . Bactrim Rash and Other (See Comments)    Rash, presumed allergy  . Sulfa Drugs Cross Reactors Rash     Current Outpatient Prescriptions on File Prior to Visit  Medication Sig Dispense Refill  . amLODipine (NORVASC) 2.5 MG tablet Take 1 tablet (2.5 mg total) by mouth daily. 90 tablet 1  . budesonide-formoterol (SYMBICORT) 160-4.5 MCG/ACT inhaler Inhale 2 puffs into the lungs 2 (two) times daily as needed.    . clobetasol (TEMOVATE) 0.05 % external solution Apply 1 application topically daily.    . clopidogrel (PLAVIX) 75 MG tablet Take 1 tablet (75 mg total) by mouth  daily. 90 tablet 3  . furosemide (LASIX) 40 MG tablet Take 1 tablet (40 mg total) by mouth every Monday, Wednesday, and Friday. 45 tablet 3  . glipiZIDE (GLUCOTROL XL) 5 MG 24 hr tablet Take 1 tablet (5 mg total) by mouth daily with breakfast. 90 tablet 3  . insulin glargine (LANTUS) 100 UNIT/ML injection Inject 25 Units into the skin daily at 3 pm.     No current facility-administered medications on file prior to visit.     Past Medical History  Diagnosis Date  . BENIGN PROSTATIC HYPERTROPHY 05/21/2007  . DIABETES MELLITUS, TYPE II 05/21/2007  . DIVERTICULOSIS, COLON 05/21/2007    pt denies  . HYPERTENSION 05/21/2007  . PSORIASIS, SCALP 10/25/2008  . Cellulitis of left foot     Hospitalized in 2006  . Gross hematuria     pt denies  . Psoriasis ELBOWS  . Fatigue AGE-RELATED  . Arthritis   . CAD (coronary artery disease) CARDIOLOGIST- DR WALL - VISIT IN JUNE 2012 FOR CATH    nonobstructive by cath 6/12:  mid LAD 30%, proximal obtuse marginal-2 30%, proximal RCA 20%, mid RCA 30-40%.  He had normal cardiac output and mildly elevated filling pressures but no significant pulmonary hypertension;   Echocardiogram in May 2012 demonstrated EF 50-55% and left atrial enlargement   . Impaired hearing BILATERAL HEARING AIDS  . Bladder cancer ([place])     2013  . Chronic kidney disease (CKD), stage III (moderate) 12/04/2007  FOLLOWED BY DR PATEL  . Back pain     s/p lumbar injection 2014     Past Surgical History  Procedure Laterality Date  . Vasectomy  15 YRS AGO  W/ ANES.  Marland Kitchen Penile prosthesis implant  15 YRS AGO  . Cardiovascular stress test  2007  . Cardiac catheterization  04-24-11/  DR Jacobe Study    MILD NONOBSTRUCTIVE CAD, NORMA CARDIAC OUTPUT  . Appendectomy  AGE 89  . Tonsillectomy and adenoidectomy  AGE 76 YRS AGO  . Cataract extraction w/ intraocular lens  implant, bilateral    . Incision and drainage foot  LEFT FOOT DUE TO INFECTION FROM  NAIL PUNCTURE INJURY  . Cystoscopy  w/ retrogrades  10/30/2011    Procedure: CYSTOSCOPY WITH RETROGRADE PYELOGRAM;  Surgeon: Molli Hazard, MD;  Location: [facility];  Service: Urology;  Laterality: Bilateral;  CYSTOSCOPY POSS TURBT BILATERAL RETROGRADE PYLEOGRAM POSS BILATERAL URETEROSCOPY WITH BIOPSY LASER ABLATION OF LESION POSS BILATERAL URETERAL STENT PLACEMENT   CARM FLEXIBLE URETERAL SCOPE DIGITAL URETERAL SCOPE PK GYRUS HO  . Transurethral resection of bladder tumor  10/30/2011    Procedure: TRANSURETHRAL RESECTION OF BLADDER TUMOR (TURBT);  Surgeon: Molli Hazard, MD;  Location: [facility];  Service: Urology;  Laterality: N/A;  . Cystoscopy  12/25/2011    Procedure: CYSTOSCOPY;  Surgeon: Molli Hazard, MD;  Location: [facility];  Service: Urology;  Laterality: N/A;  needs intubation and to be paralyzed   . Transurethral resection of bladder tumor  12/25/2011    Procedure: TRANSURETHRAL RESECTION OF BLADDER TUMOR (TURBT);  Surgeon: Molli Hazard, MD;  Location: [facility];  Service: Urology;  Laterality: N/A;  need long gyrus instruments Dr states he has spoken w/ Nelwyn Salisbury regarding this    . Cystoscopy w/ retrogrades  11/30/2012    Procedure: CYSTOSCOPY WITH RETROGRADE PYELOGRAM;  Surgeon: Molli Hazard, MD;  Location: [facility];  Service: Urology;  Laterality: Bilateral;  Flexible cystoscopy.  . Cystoscopy with biopsy  11/30/2012    Procedure: CYSTOSCOPY WITH BIOPSY;  Surgeon: Molli Hazard, MD;  Location: [facility];  Service: Urology;  Laterality: N/A;     Family History  Problem Relation Age of Onset  . Diabetes Mother   . Heart disease Brother   . Heart disease Brother   . Heart disease Brother   . Heart disease Brother   . Heart disease Brother   . Heart disease Brother   . Lung cancer Brother      Social History   Social History  . Marital Status: Married      Spouse Name: N/A  . Number of Children: 4  . Years of Education: N/A   Occupational History  . retired      [employer]	[place].   Social History Main Topics  . Smoking status: Former Smoker -- 2.00 packs/day for 22 years    Types: Cigarettes    Quit date: 11/11/1968  . Smokeless tobacco: Never Used  . Alcohol Use: 1.2 oz/week    2 Glasses of wine per week     Comment: Rare  . Drug Use: No  . Sexual Activity: Not on file   Other Topics Concern  . Not on file   Social History Narrative   Retired Event organiser.   Active with golf.   [place] vet.  [phone].       ROS A 10 point review of system  was performed. It is negative other than that mentioned in the history of present illness.   PHYSICAL EXAM   BP 138/82 mmHg  Pulse 78  Ht 6\' 2"  (1.88 m)  Wt 217 lb (98.431 kg)  BMI 27.85 kg/m2 Constitutional: He is oriented to person, place, and time. He appears well-developed and well-nourished. No distress.  HENT: No nasal discharge.  Head: Normocephalic and atraumatic.  Eyes: Pupils are equal and round.  No discharge. Neck: Normal range of motion. Neck supple. No JVD present. No thyromegaly present.  Cardiovascular: Normal rate, regular rhythm, normal heart sounds. Exam reveals no gallop and no friction rub. No murmur heard.  Pulmonary/Chest: Effort normal and breath sounds normal. No stridor. No respiratory distress. He has no wheezes. He has no rales. He exhibits no tenderness.  Abdominal: Soft. Bowel sounds are normal. He exhibits no distension. There is no tenderness. There is no rebound and no guarding.  Musculoskeletal: Normal range of motion. He exhibits no edema and no tenderness.  Neurological: He is alert and oriented to person, place, and time. Coordination normal.  Skin: Skin is warm and dry. No rash noted. He is not diaphoretic. No erythema. No pallor.  Psychiatric: He has a normal mood and affect. His behavior is normal. Judgment and thought content  normal.   Vascular: Femoral pulses are normal bilaterally. Distal pulses are not palpable.     ASSESSMENT AND PLAN

## 2015-10-17 NOTE — Patient Instructions (Signed)
Medication Instructions:  Your physician has recommended you make the following change in your medication:  Start Atorvastatin 10 mg by mouth daily at bedtime. Start Cilostazol 50 mg by mouth twice daily.   Labwork:  Your physician recommends that you return for fasting lab work in: 6 weeks. The lab opens at 7:30 AM every week day    Testing/Procedures: none  Follow-Up: Your physician recommends that you schedule a follow-up appointment in: 3 months with Dr. Fletcher Anon   Any Other Special Instructions Will Be Listed Below (If Applicable).     If you need a refill on your cardiac medications before your next appointment, please call your pharmacy.

## 2015-10-17 NOTE — Assessment & Plan Note (Signed)
The patient's bilateral foot and calf pain is likely multifactorial with a clear component of claudication. He also has symptoms suggestive of peripheral neuropathy. For below the knee peripheral arterial  disease, I rarely recommend revascularization except for critical limb ischemia due to high rate of restenosis. I recommend attempted medical therapy. He does seem to be limited by his symptoms and thus I elected to start a small dose cilostazol. I plan to increase the dose given that he is on Plavix.

## 2015-10-24 DIAGNOSIS — I509 Heart failure, unspecified: Secondary | ICD-10-CM | POA: Diagnosis not present

## 2015-10-25 ENCOUNTER — Ambulatory Visit: Payer: Medicare Other | Admitting: Cardiology

## 2015-10-29 ENCOUNTER — Other Ambulatory Visit: Payer: Self-pay | Admitting: Family Medicine

## 2015-10-29 DIAGNOSIS — Z794 Long term (current) use of insulin: Secondary | ICD-10-CM

## 2015-10-29 DIAGNOSIS — E1140 Type 2 diabetes mellitus with diabetic neuropathy, unspecified: Secondary | ICD-10-CM

## 2015-10-30 ENCOUNTER — Other Ambulatory Visit (INDEPENDENT_AMBULATORY_CARE_PROVIDER_SITE_OTHER): Payer: Medicare Other

## 2015-10-30 DIAGNOSIS — Z794 Long term (current) use of insulin: Secondary | ICD-10-CM

## 2015-10-30 DIAGNOSIS — E1140 Type 2 diabetes mellitus with diabetic neuropathy, unspecified: Secondary | ICD-10-CM | POA: Diagnosis not present

## 2015-10-30 LAB — BASIC METABOLIC PANEL
BUN: 44 mg/dL — ABNORMAL HIGH (ref 6–23)
CO2: 35 mEq/L — ABNORMAL HIGH (ref 19–32)
Calcium: 9.3 mg/dL (ref 8.4–10.5)
Chloride: 98 mEq/L (ref 96–112)
Creatinine, Ser: 2.17 mg/dL — ABNORMAL HIGH (ref 0.40–1.50)
GFR: 30.87 mL/min — ABNORMAL LOW (ref >60.00)
Glucose, Bld: 87 mg/dL (ref 70–99)
Potassium: 4.6 mEq/L (ref 3.5–5.1)
Sodium: 141 mEq/L (ref 135–145)

## 2015-10-30 LAB — HEMOGLOBIN A1C: Hgb A1c MFr Bld: 8.6 % — ABNORMAL HIGH (ref 4.6–6.5)

## 2015-10-31 ENCOUNTER — Other Ambulatory Visit: Payer: Medicare Other

## 2015-11-09 ENCOUNTER — Ambulatory Visit (INDEPENDENT_AMBULATORY_CARE_PROVIDER_SITE_OTHER): Payer: Medicare Other | Admitting: Family Medicine

## 2015-11-09 ENCOUNTER — Encounter: Payer: Self-pay | Admitting: Family Medicine

## 2015-11-09 VITALS — BP 130/64 | HR 75 | Temp 98.0°F | Wt 217.8 lb

## 2015-11-09 DIAGNOSIS — I739 Peripheral vascular disease, unspecified: Secondary | ICD-10-CM

## 2015-11-09 DIAGNOSIS — I509 Heart failure, unspecified: Secondary | ICD-10-CM | POA: Diagnosis not present

## 2015-11-09 DIAGNOSIS — E1140 Type 2 diabetes mellitus with diabetic neuropathy, unspecified: Secondary | ICD-10-CM

## 2015-11-09 DIAGNOSIS — I10 Essential (primary) hypertension: Secondary | ICD-10-CM

## 2015-11-09 DIAGNOSIS — Z794 Long term (current) use of insulin: Secondary | ICD-10-CM

## 2015-11-09 NOTE — Patient Instructions (Signed)
Don't change your meds for now.   Recheck labs in about 3 months before a visit.  Take care.  Glad to see you.

## 2015-11-09 NOTE — Progress Notes (Signed)
Pre visit review using our clinic review tool, if applicable. No additional management support is needed unless otherwise documented below in the visit note.  Rx done for O2 tanks for travel for 2 nights.  H/o CHF, with nocturnal O2 use.  Handwritten rx given to pt/wife.    Some dec in leg pain with lipitor and pletal.  He can walk in [employer], [employer] stores and that is a change from prev.  Still using his cane.  D/w pt about prev studies and the plan for medical mgmt.  He has no ulceration on the BLE.   DM2.  Sugar 105 this AM.  He is trying to be compliant with insulin, occ missed doses.  He has been working on diet, "I'm doing some better."   Labs d/w pt, esp A1c- improved but not to goal.  D/w pt, with goal for now <8.  I will try to coordinate his f/u labs with cardiology.    HTN:  He never decrease down to 2.5mg  amlodipine.  Still on 5mg  and tolerating that dose w/o ADE.  His Cr. Is near his prev baseline of 1.9-2.1, d/w pt.   Meds, vitals, and allergies reviewed.   ROS: See HPI.  Otherwise, noncontributory.  nad ncat Mmm Neck supple, no LA rrr ctab abd soft Ext without edema  Diabetic foot exam: Normal inspection No skin breakdown No calluses  Faintly palpable DP pulses B Normal sensation to light touch and monofilament Nails normal

## 2015-11-10 ENCOUNTER — Other Ambulatory Visit: Payer: Self-pay | Admitting: Family Medicine

## 2015-11-10 DIAGNOSIS — I2510 Atherosclerotic heart disease of native coronary artery without angina pectoris: Secondary | ICD-10-CM

## 2015-11-10 MED ORDER — AMLODIPINE BESYLATE 5 MG PO TABS: 5.0000 mg | ORAL_TABLET | Freq: Every day | ORAL | Status: DC

## 2015-11-10 NOTE — Assessment & Plan Note (Signed)
Continue meds as they are, Cr near baseline and d/w pt.  He agrees.

## 2015-11-10 NOTE — Assessment & Plan Note (Signed)
Continue nocturnal O2, per routine.

## 2015-11-10 NOTE — Assessment & Plan Note (Signed)
A1c improved, not to goal.  He has been working on diet.  Continue as is, no change in meds.  He got 0.7 dec in A1c w/o change in meds.  He agrees.  Recheck in about 3 months.

## 2015-11-10 NOTE — Assessment & Plan Note (Signed)
Continue medical mgmt, walking distance is improved.  Sx not resolved, but improved.  He agrees.  >25 minutes spent in face to face time with patient, >50% spent in counselling or coordination of care.

## 2015-11-19 ENCOUNTER — Telehealth: Payer: Self-pay | Admitting: Family Medicine

## 2015-11-19 NOTE — Telephone Encounter (Signed)
Notify pt.  Okay to do labs for me and for cards together in about 3 months with one lab draw.  Thanks.

## 2015-11-20 NOTE — Telephone Encounter (Signed)
Wife advised. 

## 2015-11-24 DIAGNOSIS — I509 Heart failure, unspecified: Secondary | ICD-10-CM | POA: Diagnosis not present

## 2015-11-28 ENCOUNTER — Other Ambulatory Visit (INDEPENDENT_AMBULATORY_CARE_PROVIDER_SITE_OTHER): Payer: Medicare Other | Admitting: *Deleted

## 2015-11-28 DIAGNOSIS — E785 Hyperlipidemia, unspecified: Secondary | ICD-10-CM | POA: Diagnosis not present

## 2015-11-28 LAB — HEPATIC FUNCTION PANEL
ALT: 12 U/L (ref 9–46)
AST: 19 U/L (ref 10–35)
Albumin: 4.1 g/dL (ref 3.6–5.1)
Alkaline Phosphatase: 47 U/L (ref 40–115)
Bilirubin, Direct: 0.1 mg/dL (ref <=0.2)
Indirect Bilirubin: 0.5 mg/dL (ref 0.2–1.2)
Total Bilirubin: 0.6 mg/dL (ref 0.2–1.2)
Total Protein: 6.8 g/dL (ref 6.1–8.1)

## 2015-11-28 LAB — LIPID PANEL
Cholesterol: 116 mg/dL — ABNORMAL LOW (ref 125–200)
HDL: 43 mg/dL (ref >=40)
LDL Cholesterol: 57 mg/dL (ref <130)
Total CHOL/HDL Ratio: 2.7 Ratio (ref <=5.0)
Triglycerides: 81 mg/dL (ref <150)
VLDL: 16 mg/dL (ref <30)

## 2015-12-01 ENCOUNTER — Other Ambulatory Visit: Payer: Self-pay | Admitting: Family Medicine

## 2015-12-06 DIAGNOSIS — E119 Type 2 diabetes mellitus without complications: Secondary | ICD-10-CM | POA: Diagnosis not present

## 2015-12-25 DIAGNOSIS — I509 Heart failure, unspecified: Secondary | ICD-10-CM | POA: Diagnosis not present

## 2016-01-16 ENCOUNTER — Ambulatory Visit (INDEPENDENT_AMBULATORY_CARE_PROVIDER_SITE_OTHER): Payer: Medicare Other | Admitting: Cardiovascular Disease

## 2016-01-16 ENCOUNTER — Encounter: Payer: Self-pay | Admitting: Cardiovascular Disease

## 2016-01-16 VITALS — BP 122/60 | HR 91 | Ht 74.0 in | Wt 219.3 lb

## 2016-01-16 DIAGNOSIS — I739 Peripheral vascular disease, unspecified: Secondary | ICD-10-CM

## 2016-01-16 MED ORDER — GABAPENTIN 100 MG PO CAPS
100.0000 mg | ORAL_CAPSULE | Freq: Three times a day (TID) | ORAL | Status: DC
Start: 2016-01-16 — End: 2016-02-23

## 2016-01-16 NOTE — Progress Notes (Signed)
Cardiology Office Note   Date:  01/16/2016   ID:  ANTHONE VAUTOUR, DOB [birth year], MRN [id number]  PCP:  [name], MD  Cardiologist:   [name], MD   No chief complaint on file.     History of Present Illness: [name] Small is a 82 y.o. male who presents for a follow up visit regarding peripheral arterial disease. The patient has known history of mild nonobstructive coronary artery disease, hypertension and diabetes. He quit smoking more than 40 years ago. He had bladder cancer in 2012 treated with chemotherapy. He was seen recently for bilateral calf and feet pain which is equal in both sides and happen at rest as well as with activities. He also has a lot of bilateral foot numbness.  He had noninvasive vascular evaluation in March of this year which showed an ABI of 1.0 on the right and 0.71 on the left. There was mild nonobstructive bilateral SFA disease. There was one-vessel runoff bilaterally below the knee. Some of his symptoms were felt to be possibly due to below the knee disease but not convincing for classic claudication. I started him on a trial of cilostazol but he reports no improvement in symptoms at all. He started taking gabapentin on his own at night with significant improvement in symptoms. He previously did not tolerate higher doses of gabapentin. He is planning a trip to [place].     Past Medical History  Diagnosis Date  . BENIGN PROSTATIC HYPERTROPHY 05/21/2007  . DIABETES MELLITUS, TYPE II 05/21/2007  . DIVERTICULOSIS, COLON 05/21/2007    pt denies  . HYPERTENSION 05/21/2007  . PSORIASIS, SCALP 10/25/2008  . Cellulitis of left foot     Hospitalized in 2006  . Gross hematuria     pt denies  . Psoriasis ELBOWS  . Fatigue AGE-RELATED  . Arthritis   . CAD (coronary artery disease) CARDIOLOGIST- DR WALL - VISIT IN JUNE 2012 FOR CATH    nonobstructive by cath 6/12:  mid LAD 30%, proximal obtuse marginal-2 30%, proximal RCA 20%, mid RCA 30-40%.  He  had normal cardiac output and mildly elevated filling pressures but no significant pulmonary hypertension;   Echocardiogram in May 2012 demonstrated EF 50-55% and left atrial enlargement   . Impaired hearing BILATERAL HEARING AIDS  . Bladder cancer ([place])     2013  . Chronic kidney disease (CKD), stage III (moderate) 12/04/2007    FOLLOWED BY DR PATEL  . Back pain     s/p lumbar injection 2014    Past Surgical History  Procedure Laterality Date  . Vasectomy  15 YRS AGO  W/ ANES.  Marland Kitchen Penile prosthesis implant  15 YRS AGO  . Cardiovascular stress test  2007  . Cardiac catheterization  04-24-11/  DR Allaya Abbasi    MILD NONOBSTRUCTIVE CAD, NORMA CARDIAC OUTPUT  . Appendectomy  AGE 67  . Tonsillectomy and adenoidectomy  AGE 3 YRS AGO  . Cataract extraction w/ intraocular lens  implant, bilateral    . Incision and drainage foot  LEFT FOOT DUE TO INFECTION FROM  NAIL PUNCTURE INJURY  . Cystoscopy w/ retrogrades  10/30/2011    Procedure: CYSTOSCOPY WITH RETROGRADE PYELOGRAM;  Surgeon: Molli Hazard, MD;  Location: [facility];  Service: Urology;  Laterality: Bilateral;  CYSTOSCOPY POSS TURBT BILATERAL RETROGRADE PYLEOGRAM POSS BILATERAL URETEROSCOPY WITH BIOPSY LASER ABLATION OF LESION POSS BILATERAL URETERAL STENT PLACEMENT   CARM FLEXIBLE URETERAL SCOPE DIGITAL URETERAL SCOPE PK GYRUS HO  .  Transurethral resection of bladder tumor  10/30/2011    Procedure: TRANSURETHRAL RESECTION OF BLADDER TUMOR (TURBT);  Surgeon: Molli Hazard, MD;  Location: [facility];  Service: Urology;  Laterality: N/A;  . Cystoscopy  12/25/2011    Procedure: CYSTOSCOPY;  Surgeon: Molli Hazard, MD;  Location: [facility];  Service: Urology;  Laterality: N/A;  needs intubation and to be paralyzed   . Transurethral resection of bladder tumor  12/25/2011    Procedure: TRANSURETHRAL RESECTION OF BLADDER TUMOR (TURBT);  Surgeon: Molli Hazard, MD;  Location[facility];  Service: Urology;  Laterality: N/A;  need long gyrus instruments Dr states he has spoken w/ Nelwyn Salisbury regarding this    . Cystoscopy w/ retrogrades  11/30/2012    Procedure: CYSTOSCOPY WITH RETROGRADE PYELOGRAM;  Surgeon: Molli Hazard, MD;  Location: [facility];  Service: Urology;  Laterality: Bilateral;  Flexible cystoscopy.  . Cystoscopy with biopsy  11/30/2012    Procedure: CYSTOSCOPY WITH BIOPSY;  Surgeon: Molli Hazard, MD;  Location: [facility];  Service: Urology;  Laterality: N/A;     Current Outpatient Prescriptions  Medication Sig Dispense Refill  . amLODipine (NORVASC) 5 MG tablet Take 1 tablet (5 mg total) by mouth daily.    Marland Kitchen atorvastatin (LIPITOR) 10 MG tablet Take 1 tablet (10 mg total) by mouth daily. 30 tablet 11  . budesonide-formoterol (SYMBICORT) 160-4.5 MCG/ACT inhaler Inhale 2 puffs into the lungs 2 (two) times daily as needed.    . cilostazol (PLETAL) 50 MG tablet Take 1 tablet (50 mg total) by mouth 2 (two) times daily. 60 tablet 11  . clobetasol (TEMOVATE) 0.05 % external solution Apply 1 application topically daily.    . clopidogrel (PLAVIX) 75 MG tablet Take 1 tablet (75 mg total) by mouth daily. 90 tablet 3  . furosemide (LASIX) 40 MG tablet TAKE 1 TABLET BY MOUTH  EVERY MONDAY, WEDNESDAY,  AND FRIDAY 45 tablet 1  . glipiZIDE (GLUCOTROL XL) 5 MG 24 hr tablet Take 1 tablet by mouth  daily with breakfast 90 tablet 1  . insulin glargine (LANTUS) 100 UNIT/ML injection Inject 25 Units into the skin daily at 3 pm.     No current facility-administered medications for this visit.    Allergies:   Actos; Aspirin; Ace inhibitors; Gabapentin; Lyrica; Pravastatin; Bactrim; and Sulfa drugs cross reactors    Social History:  The patient  reports that he quit smoking about 47 years ago. His smoking use included Cigarettes. He has a 44 pack-year smoking history. He has never used  smokeless tobacco. He reports that he drinks about 1.2 oz of alcohol per week. He reports that he does not use illicit drugs.   Family History:  The patient's family history includes Diabetes in his mother; Heart disease in his brother, brother, brother, brother, brother, and brother; Lung cancer in his brother.    ROS:  Please see the history of present illness.   Otherwise, review of systems are positive for none.   All other systems are reviewed and negative.    PHYSICAL EXAM: VS:  BP 122/60 mmHg  Pulse 91  Ht 6\' 2"  (1.88 m)  Wt 219 lb 4.8 oz (99.474 kg)  BMI 28.14 kg/m2  SpO2 93% , BMI Body mass index is 28.14 kg/(m^2). GEN: Well nourished, well developed, in no acute distress HEENT: normal Neck: no JVD, carotid bruits, or masses Cardiac: RRR; no murmurs, rubs, or gallops,no edema  Respiratory:  clear to auscultation bilaterally, normal work of breathing GI: soft, nontender, nondistended, + BS MS: no deformity or atrophy Skin: warm and dry, no rash Neuro:  Strength and sensation are intact Psych: euthymic mood, full affect   EKG:  EKG is not ordered today.    Recent Labs: 10/30/2015: BUN 44*; Creatinine, Ser 2.17*; Potassium 4.6; Sodium 141 11/28/2015: ALT 12    Lipid Panel    Component Value Date/Time   CHOL 116* 11/28/2015 0933   TRIG 81 11/28/2015 0933   HDL 43 11/28/2015 0933   CHOLHDL 2.7 11/28/2015 0933   VLDL 16 11/28/2015 0933   LDLCALC 57 11/28/2015 0933   LDLDIRECT 147.6 04/27/2009 1012      Wt Readings from Last 3 Encounters:  01/16/16 219 lb 4.8 oz (99.474 kg)  11/09/15 217 lb 12 oz (98.771 kg)  10/17/15 217 lb (98.431 kg)       ASSESSMENT AND PLAN:  1.  Peripheral neuropathy: The patient's symptoms seems to be mostly suggestive of peripheral neuropathy given the description and improvement with gabapentin. I have recommended gabapentin 100 mg 3 times daily. He reports intolerance to higher doses in the past. There is also reported allergy to  Lyrica.  2. Peripheral arterial disease: No convincing symptoms of claudication. He had no improvement whatsoever with cilostazol and thus I stopped the medication. Continue treatment of risk factors.  3. Hyperlipidemia: Continue treatment with atorvastatin with a target LDL of less than 70.       Disposition:   FU with me in 6 months  Signed,  [name], MD  01/16/2016 9:19 AM    [place]

## 2016-01-16 NOTE — Patient Instructions (Addendum)
Medication Instructions:  Your physician has recommended you make the following change in your medication:  1. STOP Pletal (Cilostazol) 2. START Gabapentin 100mg  take one capsule by mouth three time a day  Labwork: No new orders.   Testing/Procedures: No new orders.  Follow-Up: Your physician wants you to follow-up in: 6 MONTHS with Dr Fletcher Anon.  You will receive a reminder letter in the mail two months in advance. If you don't receive a letter, please call our office to schedule the follow-up appointment.    Any Other Special Instructions Will Be Listed Below (If Applicable).     If you need a refill on your cardiac medications before your next appointment, please call your pharmacy.

## 2016-01-22 DIAGNOSIS — I509 Heart failure, unspecified: Secondary | ICD-10-CM | POA: Diagnosis not present

## 2016-02-02 ENCOUNTER — Telehealth: Payer: Self-pay | Admitting: Family Medicine

## 2016-02-02 ENCOUNTER — Ambulatory Visit: Payer: Medicare Other | Admitting: Family Medicine

## 2016-02-02 NOTE — Telephone Encounter (Signed)
Saturday clinic is also an option if they do not want to wait to be seen  If worse over the weekend-go go UC or ED if not   Otherwise I will see pt then  Will cc to PCP as well

## 2016-02-02 NOTE — Telephone Encounter (Signed)
[facility]  Patient Name: GEORGIE SHAMBACH  DOB: 1927-11-17    Initial Comment Caller states her husband has an open wound on his foot and he is diabetic.    Nurse Assessment  Nurse: Justine Null, RN, Rodena Piety Date/Time Eilene Ghazi Time): 02/02/2016 3:41:27 PM  Confirm and document reason for call. If symptomatic, describe symptoms. You must click the next button to save text entered. ---Caller states her husband has an open wound on his foot and he is diabetic. caller stated that her husband has had the open wound has been there for the past 2 weeks and had an appointment today but they did not has been keeping clean and has no fevers and has some swelling of the foot and has been had no drainage  Has the patient traveled out of the country within the last 30 days? ---No  Does the patient have any new or worsening symptoms? ---Yes  Will a triage be completed? ---Yes  Related visit to physician within the last 2 weeks? ---No  Does the PT have any chronic conditions? (i.e. diabetes, asthma, etc.) ---Yes  List chronic conditions. ---diabetes heart disease HTN  Is this a behavioral health or substance abuse call? ---No     Guidelines    Guideline Title Affirmed Question Affirmed Notes  Diabetes - Foot Problems and Questions [1] Ulcer, wound, blister, sore, or red area AND [2] new or increasing    Final Disposition User   See Physician within [place], RN, [facility]    Referrals  [facility]   Disagree/Comply: Comply

## 2016-02-02 NOTE — Telephone Encounter (Signed)
Pt has appt on 02/06/16 at 8:30 with Dr Glori Bickers.

## 2016-02-03 ENCOUNTER — Encounter: Payer: Self-pay | Admitting: Family Medicine

## 2016-02-03 ENCOUNTER — Ambulatory Visit (INDEPENDENT_AMBULATORY_CARE_PROVIDER_SITE_OTHER): Payer: Medicare Other | Admitting: Family Medicine

## 2016-02-03 VITALS — BP 132/66 | HR 77 | Temp 97.8°F | Wt 217.0 lb

## 2016-02-03 DIAGNOSIS — E11621 Type 2 diabetes mellitus with foot ulcer: Secondary | ICD-10-CM

## 2016-02-03 DIAGNOSIS — L97529 Non-pressure chronic ulcer of other part of left foot with unspecified severity: Secondary | ICD-10-CM

## 2016-02-03 DIAGNOSIS — S91302A Unspecified open wound, left foot, initial encounter: Secondary | ICD-10-CM

## 2016-02-03 MED ORDER — CEPHALEXIN 500 MG PO CAPS: 500.0000 mg | ORAL_CAPSULE | Freq: Three times a day (TID) | ORAL | Status: DC

## 2016-02-03 NOTE — Progress Notes (Signed)
Pre visit review using our clinic review tool, if applicable. No additional management support is needed unless otherwise documented below in the visit note. 

## 2016-02-03 NOTE — Progress Notes (Signed)
OFFICE VISIT  02/04/2016   CC:  Chief Complaint  Patient presents with  . Foot Problem    left--side of big toe x 4-5 days---denies drainage---denies feeling warm in the area--pt peeled a scab off and has been using clobetsol cream and cleaning with peroxide     HPI:    Patient is a 82 y.o. Caucasian male who presents to Saturday clinic for about 2 mo of skin wound on lateral aspect of left foot great toe.  Says it started at the point of a callus and he pulled this skin off and it then opened up.  No pain b/c pt has DPN.  No drainage or foul odor.  It swells on and off on the entire foot which is not a new problem.  Has some similar swelling on other foot on/off.  He also apparently has PAD in both LL's.  No feeling of malaise or fever. Has had similar wound in the past that has healed with the help of the wound clinic.  Past Medical History  Diagnosis Date  . BENIGN PROSTATIC HYPERTROPHY 05/21/2007  . DIABETES MELLITUS, TYPE II 05/21/2007  . DIVERTICULOSIS, COLON 05/21/2007    pt denies  . HYPERTENSION 05/21/2007  . PSORIASIS, SCALP 10/25/2008  . Cellulitis of left foot     Hospitalized in 2006  . Gross hematuria     pt denies  . Psoriasis ELBOWS  . Fatigue AGE-RELATED  . Arthritis   . CAD (coronary artery disease) CARDIOLOGIST- DR WALL - VISIT IN JUNE 2012 FOR CATH    nonobstructive by cath 6/12:  mid LAD 30%, proximal obtuse marginal-2 30%, proximal RCA 20%, mid RCA 30-40%.  He had normal cardiac output and mildly elevated filling pressures but no significant pulmonary hypertension;   Echocardiogram in May 2012 demonstrated EF 50-55% and left atrial enlargement   . Impaired hearing BILATERAL HEARING AIDS  . Bladder cancer ([place])     2013  . Chronic kidney disease (CKD), stage III (moderate) 12/04/2007    FOLLOWED BY DR PATEL  . Back pain     s/p lumbar injection 2014    Past Surgical History  Procedure Laterality Date  . Vasectomy  15 YRS AGO  W/ ANES.  Marland Kitchen Penile  prosthesis implant  15 YRS AGO  . Cardiovascular stress test  2007  . Cardiac catheterization  04-24-11/  DR MUHAMMAD ARIDA    MILD NONOBSTRUCTIVE CAD, NORMA CARDIAC OUTPUT  . Appendectomy  AGE 34  . Tonsillectomy and adenoidectomy  AGE 3 YRS AGO  . Cataract extraction w/ intraocular lens  implant, bilateral    . Incision and drainage foot  LEFT FOOT DUE TO INFECTION FROM  NAIL PUNCTURE INJURY  . Cystoscopy w/ retrogrades  10/30/2011    Procedure: CYSTOSCOPY WITH RETROGRADE PYELOGRAM;  Surgeon: Molli Hazard, MD;  [facility];  Service: Urology;  Laterality: Bilateral;  CYSTOSCOPY POSS TURBT BILATERAL RETROGRADE PYLEOGRAM POSS BILATERAL URETEROSCOPY WITH BIOPSY LASER ABLATION OF LESION POSS BILATERAL URETERAL STENT PLACEMENT   CARM FLEXIBLE URETERAL SCOPE DIGITAL URETERAL SCOPE PK GYRUS HO  . Transurethral resection of bladder tumor  10/30/2011    Procedure: TRANSURETHRAL RESECTION OF BLADDER TUMOR (TURBT);  Surgeon: Molli Hazard, MD;  Location: [facility];  Service: Urology;  Laterality: N/A;  . Cystoscopy  12/25/2011    Procedure: CYSTOSCOPY;  Surgeon: Molli Hazard, MD;  Location[facility];  Service: Urology;  Laterality: N/A;  needs intubation and to be  paralyzed   . Transurethral resection of bladder tumor  12/25/2011    Procedure: TRANSURETHRAL RESECTION OF BLADDER TUMOR (TURBT);  Surgeon: Molli Hazard, MD;  Location: [facility];  Service: Urology;  Laterality: N/A;  need long gyrus instruments Dr states he has spoken w/ Nelwyn Salisbury regarding this    . Cystoscopy w/ retrogrades  11/30/2012    Procedure: CYSTOSCOPY WITH RETROGRADE PYELOGRAM;  Surgeon: Molli Hazard, MD;  Location: [facility];  Service: Urology;  Laterality: Bilateral;  Flexible cystoscopy.  . Cystoscopy with biopsy  11/30/2012    Procedure: CYSTOSCOPY WITH BIOPSY;  Surgeon: Molli Hazard, MD;  Location: [facility];  Service: Urology;  Laterality: N/A;    Outpatient Prescriptions Prior to Visit  Medication Sig Dispense Refill  . amLODipine (NORVASC) 5 MG tablet Take 1 tablet (5 mg total) by mouth daily.    Marland Kitchen atorvastatin (LIPITOR) 10 MG tablet Take 1 tablet (10 mg total) by mouth daily. 30 tablet 11  . budesonide-formoterol (SYMBICORT) 160-4.5 MCG/ACT inhaler Inhale 2 puffs into the lungs 2 (two) times daily as needed.    . clobetasol (TEMOVATE) 0.05 % external solution Apply 1 application topically daily.    . clopidogrel (PLAVIX) 75 MG tablet Take 1 tablet (75 mg total) by mouth daily. 90 tablet 3  . furosemide (LASIX) 40 MG tablet TAKE 1 TABLET BY MOUTH  EVERY MONDAY, WEDNESDAY,  AND FRIDAY 45 tablet 1  . gabapentin (NEURONTIN) 100 MG capsule Take 1 capsule (100 mg total) by mouth 3 (three) times daily. 90 capsule 11  . glipiZIDE (GLUCOTROL XL) 5 MG 24 hr tablet Take 1 tablet by mouth  daily with breakfast 90 tablet 1  . insulin glargine (LANTUS) 100 UNIT/ML injection Inject 25 Units into the skin daily at 3 pm.     No facility-administered medications prior to visit.    Allergies  Allergen Reactions  . Actos [Pioglitazone Hydrochloride] Other (See Comments)    Held 2012 bladder cancer  . Aspirin Other (See Comments)    Held 2012 due to hematuria and bruising.   . Ace Inhibitors Cough  . Gabapentin     Upset stomach and diarrhea  . Lyrica [Pregabalin] Other (See Comments)    swelling  . Pravastatin Other (See Comments)    Swelling of feet  . Bactrim Rash and Other (See Comments)    Rash, presumed allergy  . Sulfa Drugs Cross Reactors Rash    ROS As per HPI  PE: Blood pressure 132/66, pulse 77, temperature 97.8 F (36.6 C), temperature source Oral, weight 217 lb (98.431 kg), SpO2 93 %. Gen: Alert, well appearing.  Patient is oriented to person, place, time, and situation. AFFECT: pleasant, lucid thought and speech. Left great toe  with diffuse mild swelling and mildly violaceous hue.  There is an oval ulceration about 2 cm in diamter, which is dry and is exposing sub Q fat.  Nontender.  No streaking or warmth.  LABS:  none  IMPRESSION AND PLAN:  Left great toe diabetic ulcer, w/out significant sign of infection at this time. Plan: Start keflex 500 mg tid x 10d, refer to [facility].  Started wet-to-dry dressing today.  An After Visit Summary was printed and given to the patient.  FOLLOW UP: Return for 7-10d with Dr. Consuello Masse L foot wound.  Signed:  Crissie Sickles, MD           02/04/2016

## 2016-02-04 NOTE — Telephone Encounter (Signed)
Was seen on the 25th by Dr. Ernestine Conrad, please get update on patient.  Thanks.

## 2016-02-05 ENCOUNTER — Other Ambulatory Visit: Payer: Medicare Other

## 2016-02-05 NOTE — Telephone Encounter (Signed)
Patient says he had a sore on his foot and he is doing fine now.  Will see you next month.

## 2016-02-05 NOTE — Telephone Encounter (Signed)
Left message on patient's voicemail to return call

## 2016-02-06 ENCOUNTER — Ambulatory Visit: Payer: Medicare Other | Admitting: Family Medicine

## 2016-02-06 NOTE — Telephone Encounter (Signed)
Thanks

## 2016-02-08 ENCOUNTER — Ambulatory Visit
Admission: RE | Admit: 2016-02-08 | Discharge: 2016-02-08 | Disposition: A | Discharge status: Home / Self Care | Payer: Medicare Other | Source: Ambulatory Visit | Attending: Surgery | Admitting: Surgery

## 2016-02-08 ENCOUNTER — Encounter: Payer: Medicare Other | Attending: Surgery | Admitting: Surgery

## 2016-02-08 ENCOUNTER — Other Ambulatory Visit: Payer: Self-pay | Admitting: Surgery

## 2016-02-08 ENCOUNTER — Other Ambulatory Visit: Payer: Self-pay | Admitting: Family Medicine

## 2016-02-08 ENCOUNTER — Telehealth: Payer: Self-pay | Admitting: Cardiovascular Disease

## 2016-02-08 ENCOUNTER — Other Ambulatory Visit: Payer: Self-pay | Admitting: Cardiology

## 2016-02-08 DIAGNOSIS — Z794 Long term (current) use of insulin: Secondary | ICD-10-CM | POA: Diagnosis not present

## 2016-02-08 DIAGNOSIS — I129 Hypertensive chronic kidney disease with stage 1 through stage 4 chronic kidney disease, or unspecified chronic kidney disease: Secondary | ICD-10-CM | POA: Diagnosis not present

## 2016-02-08 DIAGNOSIS — L97522 Non-pressure chronic ulcer of other part of left foot with fat layer exposed: Secondary | ICD-10-CM | POA: Insufficient documentation

## 2016-02-08 DIAGNOSIS — I70245 Atherosclerosis of native arteries of left leg with ulceration of other part of foot: Secondary | ICD-10-CM | POA: Insufficient documentation

## 2016-02-08 DIAGNOSIS — Z992 Dependence on renal dialysis: Secondary | ICD-10-CM | POA: Insufficient documentation

## 2016-02-08 DIAGNOSIS — E1140 Type 2 diabetes mellitus with diabetic neuropathy, unspecified: Secondary | ICD-10-CM | POA: Diagnosis not present

## 2016-02-08 DIAGNOSIS — E11621 Type 2 diabetes mellitus with foot ulcer: Secondary | ICD-10-CM | POA: Insufficient documentation

## 2016-02-08 DIAGNOSIS — I739 Peripheral vascular disease, unspecified: Secondary | ICD-10-CM | POA: Insufficient documentation

## 2016-02-08 DIAGNOSIS — S81809A Unspecified open wound, unspecified lower leg, initial encounter: Secondary | ICD-10-CM

## 2016-02-08 DIAGNOSIS — J449 Chronic obstructive pulmonary disease, unspecified: Secondary | ICD-10-CM | POA: Insufficient documentation

## 2016-02-08 DIAGNOSIS — S91302A Unspecified open wound, left foot, initial encounter: Secondary | ICD-10-CM | POA: Diagnosis not present

## 2016-02-08 DIAGNOSIS — N183 Chronic kidney disease, stage 3 (moderate): Secondary | ICD-10-CM | POA: Insufficient documentation

## 2016-02-08 DIAGNOSIS — Z87891 Personal history of nicotine dependence: Secondary | ICD-10-CM | POA: Insufficient documentation

## 2016-02-08 DIAGNOSIS — L97529 Non-pressure chronic ulcer of other part of left foot with unspecified severity: Secondary | ICD-10-CM | POA: Diagnosis not present

## 2016-02-08 DIAGNOSIS — I2510 Atherosclerotic heart disease of native coronary artery without angina pectoris: Secondary | ICD-10-CM | POA: Diagnosis not present

## 2016-02-08 DIAGNOSIS — L97521 Non-pressure chronic ulcer of other part of left foot limited to breakdown of skin: Secondary | ICD-10-CM | POA: Diagnosis not present

## 2016-02-08 DIAGNOSIS — Z8551 Personal history of malignant neoplasm of bladder: Secondary | ICD-10-CM | POA: Diagnosis not present

## 2016-02-08 NOTE — Telephone Encounter (Signed)
Spoke with pt wife stated that wound center DR. said he needs to see his cardiologist within the next week. Reviewed that Dr. Fletcher Anon is full for the next few weeks at the [address] office and [place].  She stated they are willing to see Dr. Fletcher Anon at the [facility] office if that is a sooner appt. They are open to morning and afternoon appointments.  Told her I would send her concerns to Dr's nurse and she will get back with them as soon as she can. Verbalized understanding, no additional questions at this time.

## 2016-02-08 NOTE — Telephone Encounter (Signed)
Pt's wife called in stating that the pt went to see Dr. Con Memos at the wound care in [facility]. There is a wound on the pt's Left foot and he felt that  the pt's needed to be reviewed by his Cardiologist as soon as possible. They did not want to come in to see a APP.

## 2016-02-09 ENCOUNTER — Ambulatory Visit: Payer: Medicare Other | Admitting: Family Medicine

## 2016-02-09 NOTE — Progress Notes (Signed)
AHMAD, MAGANN ([id number]) Visit Report for 02/08/2016 Chief Complaint Document Details Patient Name: [name] Small, [name] Small. Date of Service: 02/08/2016 9:30 AM Medical Record Number: [id number] Patient Account Number: [id number] Date of Birth/Sex: 1928/03/20 (82 y.o. Male) Treating RN: Montey Hora Primary Care Physician: [name] Other Clinician: Referring Physician: [name] Treating Physician/Extender: Frann Rider in Treatment: 0 Information Obtained from: Patient Chief Complaint Patient presents to the wound care center today with an open arterial ulcer to the left medial forefoot and is also known to be a diabetic and has had these problems at least for 6 weeks Electronic Signature(s) Signed: 02/08/2016 10:53:49 AM By: Christin Fudge MD, FACS Entered By: Christin Fudge on 02/08/2016 10:53:48 [name] Small ([id number]) -------------------------------------------------------------------------------- HPI Details Patient Name: [name] Small Date of Service: 02/08/2016 9:30 AM Medical Record Number: [id number] Patient Account Number: [id number] Date of Birth/Sex: July 21, 1928 (82 y.o. Male) Treating RN: Montey Hora Primary Care Physician: [name] Other Clinician: Referring Physician: [name] Treating Physician/Extender: Frann Rider in Treatment: 0 History of Present Illness Location: ulcerated area on the left medial foot Quality: Patient reports rest pain to affected area(s). he also has typical symptoms of claudication Severity: Patient states wound are getting worse. Duration: Patient has had the wound for < 6 weeks prior to presenting for treatment Timing: Pain in wound is constant (hurts all the time) Context: The wound occurred when the patient patient off of a callus or scab from that area and noticed the ulceration Modifying Factors: Other treatment(s) tried include:hydrogen peroxide and local ointments Associated Signs and  Symptoms: Patient reports having:rest pain and typical pain of claudication HPI Description: 82 year old gentleman who was seen by his PCP recently having had a injury to the left medial foot after he peeled of a callus. He has been using clobetasol and peroxide. He's had a history of having peripheral arterial disease in both lower extremities. On inquiring with the patient he has clear symptoms of claudication and has had this for over a year and a half. Past medical history significant for diabetes mellitus type 2, BPH, diverticulosis, hypertension, psoriasis, cellulitis of the left foot treated with hospitalization in 2006, coronary artery disease, bladder cancer in 2013, chronic kidney disease stage III, status post vasectomy,penile prosthesis, cardiac, appendectomy, tonsillectomy, incision and drainage of foot abscess, patient was recently started on Keflex for 10 days and referred to the wound clinic. Of note the patient was worked up with vascular tests in March 2016 and was referred to Dr. Gwendalyn Ege who saw him on 10/17/2015 and noted an ABI of 1.0 on the right and 0.71 on the left and there was mild nonobstructive bilateral SFA disease. The patient had a one-vessel runoff bilaterally below the knee and the patient had no lower extremity ulceration. due to the nature of the below knee arterial disease he recommended medical therapy including cilostazol and Plavix. the patient was also put on a small dose of atorvastatin 10 mg daily. the patient was reviewed again on 01/16/2016 and at that stage the ulcer on the left foot was not noted. Medical management was continued and he would be seen in follow-up in 6 months time. Addendum: -- I spoke to Dr. Inda Merlin Aredia's office, and the call was taken by his colleague Dr. Esmond Plants -- he discussed the case with me in detail and would have the patient see Dr. Lovette Cliche on Monday when he was back from vacation. Electronic  Signature(s) Signed: 02/08/2016 2:57:12 PM By: Christin Fudge MD,  FACS Previous Signature: 02/08/2016 10:55:34 AM Version By: Christin Fudge MD, FACS Previous Signature: 02/08/2016 10:51:23 AM Version By: Christin Fudge MD, FACS Entered By: Christin Fudge on 02/08/2016 14:57:11 [name] Small ([id number], [name] Small ([id number]) -------------------------------------------------------------------------------- Physical Exam Details Patient Name: [name] Small Date of Service: 02/08/2016 9:30 AM Medical Record Number: [id number] Patient Account Number: [id number] Date of Birth/Sex: 02/22/1928 (82 y.o. Male) Treating RN: Montey Hora Primary Care Physician: [name] Other Clinician: Referring Physician: [name] Treating Physician/Extender: Frann Rider in Treatment: 0 Constitutional . Pulse regular. Respirations normal and unlabored. Afebrile. . Eyes Nonicteric. Reactive to light. Ears, Nose, Mouth, and Throat Lips, teeth, and gums WNL.Marland Kitchen Moist mucosa without lesions. Neck supple and nontender. No palpable supraclavicular or cervical adenopathy. Normal sized without goiter. Respiratory WNL. No retractions.. Breath sounds WNL, No rubs, rales, rhonchi, or wheeze.. Chest Breasts symmetical and no nipple discharge.. Breast tissue WNL, no masses, lumps, or tenderness.. Gastrointestinal (GI) Abdomen without masses or tenderness.. No liver or spleen enlargement or tenderness.. Lymphatic No adneopathy. No adenopathy. No adenopathy. Musculoskeletal Adexa without tenderness or enlargement.. Digits and nails w/o clubbing, cyanosis, infection, petechiae, ischemia, or inflammatory conditions.. Integumentary (Hair, Skin) No suspicious lesions. No crepitus or fluctuance. No peri-wound warmth or erythema. No masses.Marland Kitchen Psychiatric Judgement and insight Intact.. No evidence of depression, anxiety, or agitation.. Notes he has got a ulcerated area on the medial part of his  left forefoot in the region of the head of the first metatarsal. The area has necrotic debris at the base and has mild surrounding dry skin. No debridement was done as this is a arterial issue Electronic Signature(s) Signed: 02/08/2016 11:28:55 AM By: Christin Fudge MD, FACS Entered By: Christin Fudge on 02/08/2016 11:28:54 [name] Small ([id number]) -------------------------------------------------------------------------------- Physician Orders Details Patient Name: [name] Small Date of Service: 02/08/2016 9:30 AM Medical Record Number: [id number] Patient Account Number: [id number] Date of Birth/Sex: 1928/09/30 (82 y.o. Male) Treating RN: Montey Hora Primary Care Physician: [name] Other Clinician: Referring Physician: [name] Treating Physician/Extender: Frann Rider in Treatment: 0 Verbal / Phone Orders: Yes Clinician: Montey Hora Read Back and Verified: Yes Diagnosis Coding Wound Cleansing Wound #1 Left,Medial Metatarsal head first o Clean wound with Normal Saline. o Cleanse wound with mild soap and water Anesthetic Wound #1 Left,Medial Metatarsal head first o Topical Lidocaine 4% cream applied to wound bed prior to debridement Primary Wound Dressing Wound #1 Left,Medial Metatarsal head first o Santyl Ointment Secondary Dressing Wound #1 Left,Medial Metatarsal head first o Gauze and Kerlix/Conform Dressing Change Frequency Wound #1 Left,Medial Metatarsal head first o Change dressing every day. Follow-up Appointments Wound #1 Left,Medial Metatarsal head first o Return Appointment in 1 week. Additional Orders / Instructions Wound #1 Left,Medial Metatarsal head first o Increase protein intake. o Other: - work to keep blood sugars under 180 consistently Radiology o X-ray, foot - left [name] Small, [name] Small ([id number]) Electronic Signature(s) Signed: 02/08/2016 3:40:45 PM By: Christin Fudge MD, FACS Signed: 02/08/2016  5:06:11 PM By: Montey Hora Entered By: Montey Hora on 02/08/2016 10:34:38 [name] Small ([id number]) -------------------------------------------------------------------------------- Problem List Details Patient Name: [name] Small Date of Service: 02/08/2016 9:30 AM Medical Record Number: [id number] Patient Account Number: [id number] Date of Birth/Sex: 1928-09-28 (82 y.o. Male) Treating RN: Montey Hora Primary Care Physician: [name] Other Clinician: Referring Physician: [name] Treating Physician/Extender: Frann Rider in Treatment: 0 Active Problems ICD-10 Encounter Code Description Active Date Diagnosis E11.621 Type 2 diabetes mellitus with foot ulcer 02/08/2016 Yes  I70.245 Atherosclerosis of native arteries of left leg with ulceration 02/08/2016 Yes of other part of foot L97.522 Non-pressure chronic ulcer of other part of left foot with fat 02/08/2016 Yes layer exposed Inactive Problems Resolved Problems Electronic Signature(s) Signed: 02/08/2016 10:53:09 AM By: Christin Fudge MD, FACS Entered By: Christin Fudge on 02/08/2016 10:53:08 [name] Small ([id number]) -------------------------------------------------------------------------------- Progress Note Details Patient Name: [name] Small Date of Service: 02/08/2016 9:30 AM Medical Record Number: [id number] Patient Account Number: [id number] Date of Birth/Sex: 1928-10-02 (82 y.o. Male) Treating RN: Montey Hora Primary Care Physician: [name] Other Clinician: Referring Physician: [name] Treating Physician/Extender: Frann Rider in Treatment: 0 Subjective Chief Complaint Information obtained from Patient Patient presents to the wound care center today with an open arterial ulcer to the left medial forefoot and is also known to be a diabetic and has had these problems at least for 6 weeks History of Present Illness (HPI) The following HPI elements were  documented for the patient's wound: Location: ulcerated area on the left medial foot Quality: Patient reports rest pain to affected area(s). he also has typical symptoms of claudication Severity: Patient states wound are getting worse. Duration: Patient has had the wound for < 6 weeks prior to presenting for treatment Timing: Pain in wound is constant (hurts all the time) Context: The wound occurred when the patient patient off of a callus or scab from that area and noticed the ulceration Modifying Factors: Other treatment(s) tried include:hydrogen peroxide and local ointments Associated Signs and Symptoms: Patient reports having:rest pain and typical pain of claudication 82 year old gentleman who was seen by his PCP recently having had a injury to the left medial foot after he peeled of a callus. He has been using clobetasol and peroxide. He's had a history of having peripheral arterial disease in both lower extremities. On inquiring with the patient he has clear symptoms of claudication and has had this for over a year and a half. Past medical history significant for diabetes mellitus type 2, BPH, diverticulosis, hypertension, psoriasis, cellulitis of the left foot treated with hospitalization in 2006, coronary artery disease, bladder cancer in 2013, chronic kidney disease stage III, status post vasectomy,penile prosthesis, cardiac, appendectomy, tonsillectomy, incision and drainage of foot abscess, patient was recently started on Keflex for 10 days and referred to the wound clinic. Of note the patient was worked up with vascular tests in March 2016 and was referred to Dr. Gwendalyn Ege who saw him on 10/17/2015 and noted an ABI of 1.0 on the right and 0.71 on the left and there was mild nonobstructive bilateral SFA disease. The patient had a one-vessel runoff bilaterally below the knee and the patient had no lower extremity ulceration. due to the nature of the below knee arterial disease  he recommended medical therapy including cilostazol and Plavix. the patient was also put on a small dose of atorvastatin 10 mg daily. the patient was reviewed again on 01/16/2016 and at that stage the ulcer on the left foot was not noted. Medical management was continued and he would be seen in follow-up in 6 months time. Addendum: -- I spoke to Dr. Inda Merlin Aredia's office, and the call was taken by his colleague Dr. Esmond Plants -- he discussed the case with me in detail and would have the patient see Dr. Lovette Cliche on Monday [place]. ([id number]) when he was back from vacation. Wound History Patient presents with 1 open wound that has been present for approximately 1-2 weeks. Patient has been  treating wound in the following manner: clobetasol. Laboratory tests have not been performed in the last month. Patient reportedly has not tested positive for an antibiotic resistant organism. Patient reportedly has not tested positive for osteomyelitis. Patient reportedly has had testing performed to evaluate circulation in the legs. Patient History Information obtained from Patient. Allergies Actos, aspirin, ACE Inhibitors, Lyrica, pravastatin, Bactrim, Sulfa (Sulfonamide Antibiotics) Social History Former smoker - quit 30-40 years ago, Marital Status - Married, Alcohol Use - Never, Drug Use - No History, Caffeine Use - Never. Medical History Respiratory Patient has history of Chronic Obstructive Pulmonary Disease (COPD) Cardiovascular Patient has history of Coronary Artery Disease, Hypertension, Peripheral Arterial Disease Endocrine Patient has history of Type II Diabetes Neurologic Patient has history of Neuropathy Oncologic Denies history of Received Chemotherapy, Received Radiation Patient is treated with Insulin, Oral Agents. Medical And Surgical History Notes Cardiovascular blockages in both legs Integumentary (Skin) history of wound in left foot Oncologic bladder  cancer Review of Systems (ROS) Constitutional Symptoms (General Health) The patient has no complaints or symptoms. Eyes The patient has no complaints or symptoms. Ear/Nose/Mouth/Throat The patient has no complaints or symptoms. Hematologic/Lymphatic [name] Small, [name] Small ([id number]) The patient has no complaints or symptoms. Respiratory The patient has no complaints or symptoms. Gastrointestinal The patient has no complaints or symptoms. Genitourinary Complains or has symptoms of Kidney failure/ Dialysis - CKD stage 3. Immunological The patient has no complaints or symptoms. Musculoskeletal The patient has no complaints or symptoms. Objective Constitutional Pulse regular. Respirations normal and unlabored. Afebrile. Vitals Time Taken: 9:31 AM, Height: 74 in, Source: Stated, Weight: 217 lbs, Source: Stated, BMI: 27.9, Temperature: 98.2 F, Pulse: 63 bpm, Respiratory Rate: 18 breaths/min, Blood Pressure: 132/61 mmHg. Eyes Nonicteric. Reactive to light. Ears, Nose, Mouth, and Throat Lips, teeth, and gums WNL.Marland Kitchen Moist mucosa without lesions. Neck supple and nontender. No palpable supraclavicular or cervical adenopathy. Normal sized without goiter. Respiratory WNL. No retractions.. Breath sounds WNL, No rubs, rales, rhonchi, or wheeze.. Chest Breasts symmetical and no nipple discharge.. Breast tissue WNL, no masses, lumps, or tenderness.. Gastrointestinal (GI) Abdomen without masses or tenderness.. No liver or spleen enlargement or tenderness.. Lymphatic No adneopathy. No adenopathy. No adenopathy. Musculoskeletal Adexa without tenderness or enlargement.. Digits and nails w/o clubbing, cyanosis, infection, petechiae, ischemia, or inflammatory conditions.Marland Kitchen [name] Small, [name] Small ([id number]) Psychiatric Judgement and insight Intact.. No evidence of depression, anxiety, or agitation.. General Notes: he has got a ulcerated area on the medial part of his left forefoot in the region of the  head of the first metatarsal. The area has necrotic debris at the base and has mild surrounding dry skin. No debridement was done as this is a arterial issue Integumentary (Hair, Skin) No suspicious lesions. No crepitus or fluctuance. No peri-wound warmth or erythema. No masses.. Wound #1 status is Open. Original cause of wound was Gradually Appeared. The wound is located on the Left,Medial Metatarsal head first. The wound measures 1.5cm length x 1cm width x 0.2cm depth; 1.178cm^2 area and 0.236cm^3 volume. The wound is limited to skin breakdown. There is no tunneling or undermining noted. There is a medium amount of sanguinous drainage noted. The wound margin is flat and intact. There is no granulation within the wound bed. There is a large (67-100%) amount of necrotic tissue within the wound bed including Eschar. The periwound skin appearance exhibited: Callus, Dry/Scaly. The periwound skin appearance did not exhibit: Crepitus, Excoriation, Fluctuance, Friable, Induration, Localized Edema, Rash, Scarring, Maceration, Moist, Atrophie Blanche, Cyanosis, Ecchymosis, Hemosiderin Staining,  Mottled, Pallor, Rubor, Erythema. Periwound temperature was noted as No Abnormality. Assessment Active Problems ICD-10 E11.621 - Type 2 diabetes mellitus with foot ulcer I70.245 - Atherosclerosis of native arteries of left leg with ulceration of other part of foot L97.522 - Non-pressure chronic ulcer of other part of left foot with fat layer exposed This 82 year old gentleman who is a diabetic also has significant arteriosclerosis of the arteries of his left lower extremity with only a single vessel runoff below the knee and has been seen by his cardiologist Dr. Marlyne Beards 3 weeks ago. Medical management was recommended with a follow-up in 6 months. The patient has typical signs of claudication and rest pain and at this stage I believe has critical limb ischemia. I have recommended: 1. Santyl ointment  locally with a bordered foam to be changed daily 2. X-ray of the left foot. [name] Small, [name] Small ([id number]) 3. Emergent consultation with his cardiologist Dr. Midge Minium, to discuss his critical limb ischemia with a foot ulceration. 4. I will personally put in a call to speak to him and I have also asked the patient to call his office to schedule an early appointment 5. Reviewing the wound care center for further management The patient and his wife fully understand that revascularization option should be considered at this stage and that the wound healing is going to be rather dismal without revascularization. Plan Wound Cleansing: Wound #1 Left,Medial Metatarsal head first: Clean wound with Normal Saline. Cleanse wound with mild soap and water Anesthetic: Wound #1 Left,Medial Metatarsal head first: Topical Lidocaine 4% cream applied to wound bed prior to debridement Primary Wound Dressing: Wound #1 Left,Medial Metatarsal head first: Santyl Ointment Secondary Dressing: Wound #1 Left,Medial Metatarsal head first: Gauze and Kerlix/Conform Dressing Change Frequency: Wound #1 Left,Medial Metatarsal head first: Change dressing every day. Follow-up Appointments: Wound #1 Left,Medial Metatarsal head first: Return Appointment in 1 week. Additional Orders / Instructions: Wound #1 Left,Medial Metatarsal head first: Increase protein intake. Other: - work to keep blood sugars under 180 consistently Radiology ordered were: X-ray, foot - left This 82 year old gentleman who is a diabetic also has significant arteriosclerosis of the arteries of his left lower extremity with only a single vessel runoff below the knee and has been seen by his cardiologist Dr. Marlyne Beards 3 weeks ago. Medical management was recommended with a follow-up in 6 months. The patient has typical signs of claudication and rest pain and at this stage I believe has critical limb ischemia. [name] Small, [name] Small  ([id number]) I have recommended: 1. Santyl ointment locally with a bordered foam to be changed daily 2. X-ray of the left foot. 3. Emergent consultation with his cardiologist Dr. Midge Minium, to discuss his critical limb ischemia with a foot ulceration. 4. I will personally put in a call to speak to him and I have also asked the patient to call his office to schedule an early appointment 5. Reviewing the wound care center for further management The patient and his wife fully understand that revascularization option should be considered at this stage and that the wound healing is going to be rather dismal without revascularization. Notes Addendum: -- I spoke to Dr. Inda Merlin Arida's office, and the call was taken by his colleague Dr. Esmond Plants -- he discussed the case with me in detail and would have the patient see Dr. Fletcher Anon on Monday when he was back from vacation. Electronic Signature(s) Signed: 02/08/2016 2:58:01 PM By: Christin Fudge MD, FACS Previous Signature: 02/08/2016 2:57:26 PM Version By:  Christin Fudge MD, FACS Previous Signature: 02/08/2016 11:34:45 AM Version By: Christin Fudge MD, FACS Entered By: Christin Fudge on 02/08/2016 14:58:01 [name] Small ([id number]) -------------------------------------------------------------------------------- ROS/PFSH Details Patient Name: [name] Small Date of Service: 02/08/2016 9:30 AM Medical Record Number: [id number] Patient Account Number: [id number] Date of Birth/Sex: 1928-04-20 (82 y.o. Male) Treating RN: Montey Hora Primary Care Physician: [name] Other Clinician: Referring Physician: [name] Treating Physician/Extender: Frann Rider in Treatment: 0 Information Obtained From Patient Wound History Do you currently have one or more open woundso Yes How many open wounds do you currently haveo 1 Approximately how long have you had your woundso 1-2 weeks How have you been treating your wound(s) until nowo  clobetasol Has your wound(s) ever healed and then re-openedo No Have you had any lab work done in the past montho No Have you tested positive for an antibiotic resistant organism No (MRSA, VRE)o Have you tested positive for osteomyelitis (bone infection)o No Have you had any tests for circulation on your legso Yes Who ordered the testo PCP Where was the test doneo Dr Meda Coffee at [facility] in [place] Genitourinary Complaints and Symptoms: Positive for: Kidney failure/ Dialysis - CKD stage 3 Constitutional Symptoms (General Health) Complaints and Symptoms: No Complaints or Symptoms Eyes Complaints and Symptoms: No Complaints or Symptoms Ear/Nose/Mouth/Throat Complaints and Symptoms: No Complaints or Symptoms Hematologic/Lymphatic Complaints and Symptoms: No Complaints or Symptoms [name] Small, [name] Small ([id number]) Respiratory Complaints and Symptoms: No Complaints or Symptoms Medical History: Positive for: Chronic Obstructive Pulmonary Disease (COPD) Cardiovascular Medical History: Positive for: Coronary Artery Disease; Hypertension; Peripheral Arterial Disease Past Medical History Notes: blockages in both legs Gastrointestinal Complaints and Symptoms: No Complaints or Symptoms Endocrine Medical History: Positive for: Type II Diabetes Treated with: Insulin, Oral agents Immunological Complaints and Symptoms: No Complaints or Symptoms Integumentary (Skin) Medical History: Past Medical History Notes: history of wound in left foot Musculoskeletal Complaints and Symptoms: No Complaints or Symptoms Neurologic Medical History: Positive for: Neuropathy Oncologic Medical History: Negative for: Received Chemotherapy; Received Radiation Past Medical History Notes: [name] Small, [name] Small ([id number]) bladder cancer Family and Social History Former smoker - quit 30-40 years ago; Marital Status - Married; Alcohol Use: Never; Drug Use: No History; Caffeine Use: Never; Financial  Concerns: No; Food, Clothing or Shelter Needs: No; Support System Lacking: No; Transportation Concerns: No; Advanced Directives: No; Patient does not want information on Advanced Directives Physician Affirmation I have reviewed and agree with the above information. Electronic Signature(s) Signed: 02/08/2016 10:07:26 AM By: Christin Fudge MD, FACS Signed: 02/08/2016 5:06:11 PM By: Montey Hora Previous Signature: 02/08/2016 10:06:34 AM Version By: Christin Fudge MD, FACS Entered By: Christin Fudge on 02/08/2016 10:07:26 [name] Small ([id number]) -------------------------------------------------------------------------------- SuperBill Details Patient Name: [name] Small Date of Service: 02/08/2016 Medical Record Number: [id number] Patient Account Number: [id number] Date of Birth/Sex: [birth year] (82 y.o. Male) Treating RN: Montey Hora Primary Care Physician: [name] Other Clinician: Referring Physician: [name] Treating Physician/Extender: Frann Rider in Treatment: 0 Diagnosis Coding ICD-10 Codes Code Description E11.621 Type 2 diabetes mellitus with foot ulcer I70.245 Atherosclerosis of native arteries of left leg with ulceration of other part of foot L97.522 Non-pressure chronic ulcer of other part of left foot with fat layer exposed Facility Procedures CPT4 Code: [id number] Description: 99214 - WOUND CARE VISIT-LEV 4 EST PT Modifier: Quantity: 1 Physician Procedures CPT4: Description Modifier Quantity Code [id number] - WC PHYS LEVEL 4 - NEW PT 1 ICD-10 Description Diagnosis E11.621 Type 2 diabetes mellitus with foot  ulcer I70.245 Atherosclerosis of native arteries of left leg with ulceration of other part of foot  L97.522 Non-pressure chronic ulcer of other part of left foot with fat layer exposed Electronic Signature(s) Signed: 02/08/2016 11:39:14 AM By: Montey Hora Signed: 02/08/2016 3:40:45 PM By: Christin Fudge MD, FACS Previous Signature:  02/08/2016 11:35:04 AM Version By: Christin Fudge MD, FACS Entered By: Montey Hora on 02/08/2016 11:39:14

## 2016-02-09 NOTE — Telephone Encounter (Signed)
I spoke with the pt's wife and she said the pt pulled a callus off his left foot and this has not healed and turned into a wound. The pt was seen at the wound center yesterday and x-ray was performed. Dr Con Memos requested that the pt see Dr Fletcher Anon again for evaluation.  The pt was given an antibiotic by Dr Anitra Lauth on 02/03/16. In reviewing the pt's appointments the pt is already scheduled to see Dr Fletcher Anon in the [place] office on 02/12/16.

## 2016-02-09 NOTE — Progress Notes (Signed)
PIERS, SUMMERSON ([id number]) Visit Report for 02/08/2016 Allergy List Details Patient Name: [name] Small, [name] Small. Date of Service: 02/08/2016 9:30 AM Medical Record Number: [id number] Patient Account Number: [id number] Date of Birth/Sex: [birth year] (82 y.o. Male) Treating RN: Montey Hora Primary Care Physician: [name] Other Clinician: Referring Physician: [name] Treating Physician/Extender: Frann Rider in Treatment: 0 Allergies Active Allergies Actos aspirin ACE Inhibitors Lyrica pravastatin Bactrim Sulfa (Sulfonamide Antibiotics) Allergy Notes Electronic Signature(s) Signed: 02/08/2016 5:06:11 PM By: Montey Hora Entered By: Montey Hora on 02/08/2016 09:35:06 [name] Small ([id number]) -------------------------------------------------------------------------------- Arrival Information Details Patient Name: [name] Small Date of Service: 02/08/2016 9:30 AM Medical Record Number: [id number] Patient Account Number: [id number] Date of Birth/Sex: [birth year] (82 y.o. Male) Treating RN: Montey Hora Primary Care Physician: [name] Other Clinician: Referring Physician: [name] Treating Physician/Extender: Frann Rider in Treatment: 0 Visit Information Patient Arrived: [place] Ple[place] Time: 09:30 Accompanied By: spouse Transfer Assistance: None Patient Identification Verified: Yes Secondary Verification Process Completed: Yes Patient Has Alerts: Yes Patient Alerts: DMII Electronic Signature(s) Signed: 02/08/2016 5:06:11 PM By: Montey Hora Entered By: Montey Hora on 02/08/2016 09:30:46 [name] Small ([id number]) -------------------------------------------------------------------------------- Clinic Level of Care Assessment Details Patient Name: [name] Small Date of Service: 02/08/2016 9:30 AM Medical Record Number: [id number] Patient Account Number: [id number] Date of Birth/Sex: Mar 22, 1928 (82 y.o.  Male) Treating RN: Montey Hora Primary Care Physician: [name] Other Clinician: Referring Physician: [name] Treating Physician/Extender: Frann Rider in Treatment: 0 Clinic Level of Care Assessment Items TOOL 2 Quantity Score []  - Use when only an EandM is performed on the INITIAL visit 0 ASSESSMENTS - Nursing Assessment / Reassessment X - General Physical Exam (combine w/ comprehensive assessment (listed just 1 20 below) when performed on new pt. evals) X - Comprehensive Assessment (HX, ROS, Risk Assessments, Wounds Hx, etc.) 1 25 ASSESSMENTS - Wound and Skin Assessment / Reassessment X - Simple Wound Assessment / Reassessment - one wound 1 5 []  - Complex Wound Assessment / Reassessment - multiple wounds 0 []  - Dermatologic / Skin Assessment (not related to wound area) 0 ASSESSMENTS - Ostomy and/or Continence Assessment and Care []  - Incontinence Assessment and Management 0 []  - Ostomy Care Assessment and Management (repouching, etc.) 0 PROCESS - Coordination of Care X - Simple Patient / Family Education for ongoing care 1 15 []  - Complex (extensive) Patient / Family Education for ongoing care 0 X - Staff obtains Programmer, systems, Records, Test Results / Process Orders 1 10 []  - Staff telephones HHA, Nursing Homes / Clarify orders / etc 0 []  - Routine Transfer to another Facility (non-emergent condition) 0 []  - Routine Hospital Admission (non-emergent condition) 0 X - New Admissions / Biomedical engineer / Ordering NPWT, Apligraf, etc. 1 15 []  - Emergency Hospital Admission (emergent condition) 0 X - Simple Discharge Coordination 1 10 KELDEN, OREJEL. ([id number]) []  - Complex (extensive) Discharge Coordination 0 PROCESS - Special Needs []  - Pediatric / Minor Patient Management 0 []  - Isolation Patient Management 0 []  - Hearing / Language / Visual special needs 0 []  - Assessment of Community assistance (transportation, D/C planning, etc.) 0 []  -  Additional assistance / Altered mentation 0 []  - Support Surface(s) Assessment (bed, cushion, seat, etc.) 0 INTERVENTIONS - Wound Cleansing / Measurement X - Wound Imaging (photographs - any number of wounds) 1 5 []  - Wound Tracing (instead of photographs) 0 X - Simple Wound Measurement - one wound 1 5 []  - Complex Wound Measurement - multiple  wounds 0 X - Simple Wound Cleansing - one wound 1 5 []  - Complex Wound Cleansing - multiple wounds 0 INTERVENTIONS - Wound Dressings X - Small Wound Dressing one or multiple wounds 1 10 []  - Medium Wound Dressing one or multiple wounds 0 []  - Large Wound Dressing one or multiple wounds 0 []  - Application of Medications - injection 0 INTERVENTIONS - Miscellaneous []  - External ear exam 0 []  - Specimen Collection (cultures, biopsies, blood, body fluids, etc.) 0 []  - Specimen(s) / Culture(s) sent or taken to Lab for analysis 0 []  - Patient Transfer (multiple staff / Harrel Lemon Lift / Similar devices) 0 []  - Simple Staple / Suture removal (25 or less) 0 []  - Complex Staple / Suture removal (26 or more) 0 Bobby, Amon N. ([id number]) []  - Hypo / Hyperglycemic Management (close monitor of Blood Glucose) 0 X - Ankle / Brachial Index (ABI) - do not check if billed separately 1 15 Has the patient been seen at the hospital within the last three years: Yes Total Score: 140 Level Of Care: New/Established - Level 4 Electronic Signature(s) Signed: 02/08/2016 11:39:04 AM By: Montey Hora Entered By: Montey Hora on 02/08/2016 11:39:03 [name] Small ([id number]) -------------------------------------------------------------------------------- Encounter Discharge Information Details Patient Name: [name] Small Date of Service: 02/08/2016 9:30 AM Medical Record Number: [id number] Patient Account Number: [id number] Date of Birth/Sex: Jun 16, 1928 (82 y.o. Male) Treating RN: Montey Hora Primary Care Physician: [name] Other Clinician: Referring  Physician: [name] Treating Physician/Extender: Frann Rider in Treatment: 0 Encounter Discharge Information Items Discharge Pain Level: 0 Discharge Condition: Stable Ambulatory Status: Cane Discharge Destination: Home Transportation: Private Auto Accompanied By: spouse Schedule Follow-up Appointment: Yes Medication Reconciliation completed No and provided to Patient/Care Delmon Andrada: Provided on Clinical Summary of Care: 02/08/2016 Form Type Recipient Paper Patient RB Electronic Signature(s) Signed: 02/08/2016 11:40:02 AM By: Montey Hora Previous Signature: 02/08/2016 10:49:02 AM Version By: Ruthine Dose Entered By: Montey Hora on 02/08/2016 11:40:01 [name] Small ([id number]) -------------------------------------------------------------------------------- Lower Extremity Assessment Details Patient Name: [name] Small Date of Service: 02/08/2016 9:30 AM Medical Record Number: [id number] Patient Account Number: [id number] Date of Birth/Sex: 1928/08/16 (82 y.o. Male) Treating RN: Montey Hora Primary Care Physician: [name] Other Clinician: Referring Physician: [name] Treating Physician/Extender: Frann Rider in Treatment: 0 Edema Assessment Assessed: [Left: No] [Right: No] Edema: [Left: Yes] [Right: No] Calf Left: Right: Point of Measurement: 35 cm From Medial Instep 35.4 cm 35.3 cm Ankle Left: Right: Point of Measurement: 12 cm From Medial Instep 23.5 cm 23.3 cm Vascular Assessment Pulses: Posterior Tibial Palpable: [Left:No] [Right:No] Doppler: [Left:Monophasic] Dorsalis Pedis Palpable: [Left:Yes] [Right:Yes] Doppler: [Left:Monophasic] Extremity colors, hair growth, and conditions: Extremity Color: [Left:Normal] [Right:Normal] Hair Growth on Extremity: [Left:No] [Right:No] Temperature of Extremity: [Left:Warm] [Right:Warm] Capillary Refill: [Left:< 3 seconds] [Right:< 3 seconds] Blood Pressure: Brachial:  [Left:132] Dorsalis Pedis: [Left:Dorsalis Pedis: 160] Ankle: Posterior Tibial: 82 [Left:Posterior Tibial: 122 0.62] [Right:1.21] Toe Nail Assessment Left: Right: Thick: Yes Yes Discolored: No No Deformed: No No Improper Length and Hygiene: No No ISAIH, GENTER ([id number]) Electronic Signature(s) Signed: 02/08/2016 5:06:11 PM By: Montey Hora Entered By: Montey Hora on 02/08/2016 10:00:19 [name] Small ([id number]) -------------------------------------------------------------------------------- Multi Wound Chart Details Patient Name: [name] Small Date of Service: 02/08/2016 9:30 AM Medical Record Number: [id number] Patient Account Number: [id number] Date of Birth/Sex: 29-Jul-1928 (82 y.o. Male) Treating RN: Montey Hora Primary Care Physician: [name] Other Clinician: Referring Physician: [name] Treating Physician/Extender: Frann Rider in Treatment: 0  Vital Signs Height(in): 74 Pulse(bpm): 63 Weight(lbs): 217 Blood Pressure 132/61 (mmHg): Body Mass Index(BMI): 28 Temperature(F): 98.2 Respiratory Rate 18 (breaths/min): Photos: [1:No Photos] [N/A:N/A] Wound Location: [1:Left Metatarsal head first - N/A Lateral] Wounding Event: [1:Gradually Appeared] [N/A:N/A] Primary Etiology: [1:Diabetic Wound/Ulcer of N/A the Lower Extremity] Comorbid History: [1:Chronic Obstructive Pulmonary Disease (COPD), Coronary Artery Disease, Hypertension, Peripheral Arterial Disease, Type II Diabetes, Neuropathy] [N/A:N/A] Date Acquired: [1:01/15/2016] [N/A:N/A] Weeks of Treatment: [1:0] [N/A:N/A] Wound Status: [1:Open] [N/A:N/A] Pending Amputation on Yes [N/A:N/A] Presentation: Measurements L x W x D 1.5x1x0.2 [N/A:N/A] (cm) Area (cm) : [1:1.178] [N/A:N/A] Volume (cm) : [1:0.236] [N/A:N/A] Classification: [1:Grade 2] [N/A:N/A] Exudate Amount: [1:Medium] [N/A:N/A] Exudate Type: [1:Sanguinous] [N/A:N/A] Exudate Color: [1:red] [N/A:N/A] Wound  Margin: [1:Flat and Intact] [N/A:N/A] Granulation Amount: [1:None Present (0%)] [N/A:N/A] Necrotic Amount: [1:Large (67-100%)] [N/A:N/A] Necrotic Tissue: [1:Eschar] [N/A:N/A] Exposed Structures: Fascia: No N/A N/A Fat: No Tendon: No Muscle: No Joint: No Bone: No Limited to Skin Breakdown Epithelialization: None N/A N/A Periwound Skin Texture: Callus: Yes N/A N/A Edema: No Excoriation: No Induration: No Crepitus: No Fluctuance: No Friable: No Rash: No Scarring: No Periwound Skin Dry/Scaly: Yes N/A N/A Moisture: Maceration: No Moist: No Periwound Skin Color: Atrophie Blanche: No N/A N/A Cyanosis: No Ecchymosis: No Erythema: No Hemosiderin Staining: No Mottled: No Pallor: No Rubor: No Temperature: No Abnormality N/A N/A Tenderness on No N/A N/A Palpation: Wound Preparation: Ulcer Cleansing: N/A N/A Rinsed/Irrigated with Saline Topical Anesthetic Applied: Other: lidocaine 4% Treatment Notes Electronic Signature(s) Signed: 02/08/2016 5:06:11 PM By: Montey Hora Entered By: Montey Hora on 02/08/2016 10:22:35 [name] Small ([id number]) -------------------------------------------------------------------------------- [place] Details Patient Name: [name] Small Date of Service: 02/08/2016 9:30 AM Medical Record Number: [id number] Patient Account Number: [id number] Date of Birth/Sex: [birth year] (82 y.o. Male) Treating RN: Montey Hora Primary Care Physician: [name] Other Clinician: Referring Physician: [name] Treating Physician/Extender: Frann Rider in Treatment: 0 Active Inactive Abuse / Safety / Falls / Self Care Management Nursing Diagnoses: Potential for falls Goals: Patient will remain injury free Date Initiated: 02/08/2016 Goal Status: Active Interventions: Assess fall risk on admission and as needed Notes: Orientation to the Wound Care Program Nursing Diagnoses: Knowledge deficit related to  the wound healing center program Goals: Patient/caregiver will verbalize understanding of the [facility] Program Date Initiated: 02/08/2016 Goal Status: Active Interventions: Provide education on orientation to the wound center Notes: Wound/Skin Impairment Nursing Diagnoses: Impaired tissue integrity Goals: Patient/caregiver will verbalize understanding of skin care regimen Date Initiated: 02/08/2016 [name] Small ([id number]) Goal Status: Active Ulcer/skin breakdown will have a volume reduction of 30% by week 4 Date Initiated: 02/08/2016 Goal Status: Active Ulcer/skin breakdown will have a volume reduction of 50% by week 8 Date Initiated: 02/08/2016 Goal Status: Active Ulcer/skin breakdown will have a volume reduction of 80% by week 12 Date Initiated: 02/08/2016 Goal Status: Active Ulcer/skin breakdown will heal within 14 weeks Date Initiated: 02/08/2016 Goal Status: Active Interventions: Assess patient/caregiver ability to obtain necessary supplies Assess patient/caregiver ability to perform ulcer/skin care regimen upon admission and as needed Assess ulceration(s) every visit Notes: Electronic Signature(s) Signed: 02/08/2016 5:06:11 PM By: Montey Hora Entered By: Montey Hora on 02/08/2016 10:21:26 [name] Small ([id number]) -------------------------------------------------------------------------------- Patient/Caregiver Education Details Patient Name: [name] Small Date of Service: 02/08/2016 9:30 AM Medical Record Number: [id number] Patient Account Number: [id number] Date of Birth/Gender: [birth year] (82 y.o. Male) Treating RN: Montey Hora Primary Care Physician: [name] Other Clinician: Referring Physician: [name] Treating Physician/Extender: Frann Rider in  Treatment: 0 Education Assessment Education Provided To: Patient and Caregiver Education Topics Provided Wound/Skin Impairment: Handouts: Other: wound care as  ordered Methods: Demonstration, Explain/Verbal Responses: State content correctly Electronic Signature(s) Signed: 02/08/2016 11:40:22 AM By: Montey Hora Entered By: Montey Hora on 02/08/2016 11:40:22 [name] Small ([id number]) -------------------------------------------------------------------------------- Wound Assessment Details Patient Name: [name] Small Date of Service: 02/08/2016 9:30 AM Medical Record Number: [id number] Patient Account Number: [id number] Date of Birth/Sex: 01/01/1928 (82 y.o. Male) Treating RN: Montey Hora Primary Care Physician: [name] Other Clinician: Referring Physician: [name] Treating Physician/Extender: Frann Rider in Treatment: 0 Wound Status Wound Number: 1 Primary Diabetic Wound/Ulcer of the Lower Etiology: Extremity Wound Location: Left Metatarsal head first - Lateral Wound Open Status: Wounding Event: Gradually Appeared Comorbid Chronic Obstructive Pulmonary Disease Date Acquired: 01/15/2016 History: (COPD), Coronary Artery Disease, Weeks Of Treatment: 0 Hypertension, Peripheral Arterial Clustered Wound: No Disease, Type II Diabetes, Neuropathy Pending Amputation On Presentation Photos Photo Uploaded By: Montey Hora on 02/08/2016 11:43:54 Wound Measurements Length: (cm) 1.5 Width: (cm) 1 Depth: (cm) 0.2 Area: (cm) 1.178 Volume: (cm) 0.236 % Reduction in Area: % Reduction in Volume: Epithelialization: None Tunneling: No Undermining: No Wound Description Classification: Grade 2 Foul Odor Aft Wound Margin: Flat and Intact Exudate Amount: Medium Exudate Type: Sanguinous Exudate Color: red er Cleansing: No Wound Bed Granulation Amount: None Present (0%) Exposed Structure Necrotic Amount: Large (67-100%) Fascia Exposed: No DRESON, NAZARENO ([id number]) Necrotic Quality: Eschar Fat Layer Exposed: No Tendon Exposed: No Muscle Exposed: No Joint Exposed: No Bone Exposed: No Limited to  Skin Breakdown Periwound Skin Texture Texture Color No Abnormalities Noted: No No Abnormalities Noted: No Callus: Yes Atrophie Blanche: No Crepitus: No Cyanosis: No Excoriation: No Ecchymosis: No Fluctuance: No Erythema: No Friable: No Hemosiderin Staining: No Induration: No Mottled: No Localized Edema: No Pallor: No Rash: No Rubor: No Scarring: No Temperature / Pain Moisture Temperature: No Abnormality No Abnormalities Noted: No Dry / Scaly: Yes Maceration: No Moist: No Wound Preparation Ulcer Cleansing: Rinsed/Irrigated with Saline Topical Anesthetic Applied: Other: lidocaine 4%, Electronic Signature(s) Signed: 02/08/2016 5:06:11 PM By: Montey Hora Entered By: Montey Hora on 02/08/2016 09:49:47 [name] Small ([id number]) -------------------------------------------------------------------------------- Vitals Details Patient Name: [name] Small Date of Service: 02/08/2016 9:30 AM Medical Record Number: [id number] Patient Account Number: [id number] Date of Birth/Sex: 1928-09-12 (82 y.o. Male) Treating RN: Montey Hora Primary Care Physician: [name] Other Clinician: Referring Physician: [name] Treating Physician/Extender: Frann Rider in Treatment: 0 Vital Signs Time Taken: 09:31 Temperature (F): 98.2 Height (in): 74 Pulse (bpm): 63 Source: Stated Respiratory Rate (breaths/min): 18 Weight (lbs): 217 Blood Pressure (mmHg): 132/61 Source: Stated Reference Range: 80 - 120 mg / dl Body Mass Index (BMI): 27.9 Electronic Signature(s) Signed: 02/08/2016 5:06:11 PM By: Montey Hora Entered By: Montey Hora on 02/08/2016 09:33:15

## 2016-02-09 NOTE — Progress Notes (Signed)
ISSIAC, SEACHRIST ([id number]) Visit Report for 02/08/2016 Abuse/Suicide Risk Screen Details Patient Name: [name] Small, [name] Small. Date of Service: 02/08/2016 9:30 AM Medical Record Number: [id number] Patient Account Number: [id number] Date of Birth/Sex: 01/17/1928 (82 y.o. Male) Treating RN: Montey Hora Primary Care Physician: [name] Other Clinician: Referring Physician: [name] Treating Physician/Extender: Frann Rider in Treatment: 0 Abuse/Suicide Risk Screen Items Answer ABUSE/SUICIDE RISK SCREEN: Has anyone close to you tried to hurt or harm you recentlyo No Do you feel uncomfortable with anyone in your familyo No Has anyone forced you do things that you didnot want to doo No Do you have any thoughts of harming yourselfo No Patient displays signs or symptoms of abuse and/or neglect. No Electronic Signature(s) Signed: 02/08/2016 5:06:11 PM By: Montey Hora Entered By: Montey Hora on 02/08/2016 09:41:34 [name] Small ([id number]) -------------------------------------------------------------------------------- Activities of Daily Living Details Patient Name: [name] Small Date of Service: 02/08/2016 9:30 AM Medical Record Number: [id number] Patient Account Number: [id number] Date of Birth/Sex: [birth year] (82 y.o. Male) Treating RN: Montey Hora Primary Care Physician: [name] Other Clinician: Referring Physician: [name] Treating Physician/Extender: Frann Rider in Treatment: 0 Activities of Daily Living Items Answer Activities of Daily Living (Please select one for each item) Drive Automobile Completely Able Take Medications Completely Able Use Telephone Completely Able Care for Appearance Completely Able Use Toilet Completely Able Bath / Shower Completely Able Dress Self Completely Able Feed Self Completely Able Walk Completely Able Get In / Out Bed Completely Able Housework Completely Able Prepare Meals Completely  [place] for Self Completely Able Electronic Signature(s) Signed: 02/08/2016 5:06:11 PM By: Montey Hora Entered By: Montey Hora on 02/08/2016 09:42:03 [name] Small ([id number]) -------------------------------------------------------------------------------- Education Assessment Details Patient Name: [name] Small Date of Service: 02/08/2016 9:30 AM Medical Record Number: [id number] Patient Account Number: [id number] Date of Birth/Sex: 02-18-1928 (82 y.o. Male) Treating RN: Montey Hora Primary Care Physician: [name] Other Clinician: Referring Physician: [name] Treating Physician/Extender: Frann Rider in Treatment: 0 Primary Learner Assessed: Patient Learning Preferences/Education Level/Primary Language Learning Preference: Explanation, Demonstration Highest Education Level: College or Above Preferred Language: English Cognitive Barrier Assessment/Beliefs Language Barrier: No Translator Needed: No Memory Deficit: No Emotional Barrier: No Cultural/Religious Beliefs Affecting Medical No Care: Physical Barrier Assessment Impaired Vision: No Impaired Hearing: No Decreased Hand dexterity: No Knowledge/Comprehension Assessment Knowledge Level: Medium Comprehension Level: Medium Ability to understand written Medium instructions: Ability to understand verbal Medium instructions: Motivation Assessment Anxiety Level: Calm Cooperation: Cooperative Education Importance: Acknowledges Need Interest in Health Problems: Asks Questions Perception: Coherent Willingness to Engage in Self- Medium Management Activities: Readiness to Engage in Self- Medium Management Activities: Electronic Signature(s) KEYVIN, EAVEY ([id number]) Signed: 02/08/2016 5:06:11 PM By: Montey Hora Entered By: Montey Hora on 02/08/2016 09:42:23 [name] Small  ([id number]) -------------------------------------------------------------------------------- Fall Risk Assessment Details Patient Name: [name] Small Date of Service: 02/08/2016 9:30 AM Medical Record Number: [id number] Patient Account Number: [id number] Date of Birth/Sex: 19-Feb-1928 (82 y.o. Male) Treating RN: Montey Hora Primary Care Physician: [name] Other Clinician: Referring Physician: [name] Treating Physician/Extender: Frann Rider in Treatment: 0 Fall Risk Assessment Items Have you had 2 or more falls in the last 12 monthso 0 No Have you had any fall that resulted in injury in the last 12 monthso 0 No FALL RISK ASSESSMENT: History of falling - immediate or within 3 months 0 No Secondary diagnosis 0 No Ambulatory aid None/bed rest/wheelchair/nurse 0 No Crutches/cane/walker 15 Yes Furniture  0 No IV Access/Saline Lock 0 No Gait/Training Normal/bed rest/immobile 0 No Weak 10 Yes Impaired 0 No Mental Status Oriented to own ability 0 Yes Electronic Signature(s) Signed: 02/08/2016 5:06:11 PM By: Montey Hora Entered By: Montey Hora on 02/08/2016 09:42:41 [name] Small ([id number]) -------------------------------------------------------------------------------- Foot Assessment Details Patient Name: [name] Small Date of Service: 02/08/2016 9:30 AM Medical Record Number: [id number] Patient Account Number: [id number] Date of Birth/Sex: 1928/03/08 (82 y.o. Male) Treating RN: Montey Hora Primary Care Physician: [name] Other Clinician: Referring Physician: [name] Treating Physician/Extender: Frann Rider in Treatment: 0 Foot Assessment Items Site Locations + = Sensation present, - = Sensation absent, C = Callus, U = Ulcer R = Redness, W = Warmth, M = Maceration, PU = Pre-ulcerative lesion F = Fissure, S = Swelling, D = Dryness Assessment Right: Left: Other Deformity: No No Prior Foot Ulcer: No No Prior  Amputation: No No Charcot Joint: No No Ambulatory Status: Ambulatory With Help Assistance Device: Cane Gait: Steady Electronic Signature(s) Signed: 02/08/2016 5:06:11 PM By: Montey Hora Entered By: Montey Hora on 02/08/2016 09:47:05 [name] Small ([id number]) -------------------------------------------------------------------------------- Nutrition Risk Assessment Details Patient Name: [name] Small Date of Service: 02/08/2016 9:30 AM Medical Record Number: [id number] Patient Account Number: [id number] Date of Birth/Sex: 1928/07/01 (82 y.o. Male) Treating RN: Montey Hora Primary Care Physician: [name] Other Clinician: Referring Physician: [name] Treating Physician/Extender: Frann Rider in Treatment: 0 Height (in): 74 Weight (lbs): 217 Body Mass Index (BMI): 27.9 Nutrition Risk Assessment Items NUTRITION RISK SCREEN: I have an illness or condition that made me change the kind and/or 0 No amount of food I eat I eat fewer than two meals per day 0 No I eat few fruits and vegetables, or milk products 0 No I have three or more drinks of beer, liquor or wine almost every day 0 No I have tooth or mouth problems that make it hard for me to eat 0 No I don't always have enough money to buy the food I need 0 No I eat alone most of the time 0 No I take three or more different prescribed or over-the-counter drugs a 1 Yes day Without wanting to, I have lost or gained 10 pounds in the last six 0 No months I am not always physically able to shop, cook and/or feed myself 0 No Nutrition Protocols Good Risk Protocol 0 No interventions needed Moderate Risk Protocol Electronic Signature(s) Signed: 02/08/2016 5:06:11 PM By: Montey Hora Entered By: Montey Hora on 02/08/2016 09:42:48

## 2016-02-12 ENCOUNTER — Ambulatory Visit (INDEPENDENT_AMBULATORY_CARE_PROVIDER_SITE_OTHER): Payer: Medicare Other | Admitting: Cardiovascular Disease

## 2016-02-12 ENCOUNTER — Encounter: Payer: Self-pay | Admitting: Cardiovascular Disease

## 2016-02-12 VITALS — BP 120/60 | HR 67 | Ht 74.0 in | Wt 217.8 lb

## 2016-02-12 DIAGNOSIS — I4891 Unspecified atrial fibrillation: Secondary | ICD-10-CM

## 2016-02-12 DIAGNOSIS — I739 Peripheral vascular disease, unspecified: Secondary | ICD-10-CM

## 2016-02-12 DIAGNOSIS — I70229 Atherosclerosis of native arteries of extremities with rest pain, unspecified extremity: Secondary | ICD-10-CM

## 2016-02-12 DIAGNOSIS — I998 Other disorder of circulatory system: Secondary | ICD-10-CM | POA: Diagnosis not present

## 2016-02-12 NOTE — Patient Instructions (Addendum)
Medication Instructions:  Your physician recommends that you continue on your current medications as directed. Please refer to the Current Medication list given to you today.    Labwork: BMET, CBC, PT/INR  Testing/Procedures: Abdominal aortogram Wednesday, April 5, arrival time 6:30am  [facility] Short Stay Entrance A [address] [place]  Nothing to eat or drink after midnight the evening before your procedure. Do not take glipizide the morning of your procedure. You may take your other morning medications with a sip of water.  Reduce insulin to 13units the day before your procedure.    Follow-Up: Your physician recommends that you schedule a follow-up appointment after April 5th procedure.        Any Other Special Instructions Will Be Listed Below (If Applicable).     If you need a refill on your cardiac medications before your next appointment, please call your pharmacy.

## 2016-02-12 NOTE — Progress Notes (Signed)
Cardiology Office Note   Date:  02/12/2016   ID:  [name] Small, DOB 06/30/1928, MRN [id number]  PCP:  [name], MD  Cardiologist:   [name], MD   Chief Complaint  Patient presents with  . other    See telephone note. Meds reviewed verbally with pt.      History of Present Illness: [name] Small is a 82 y.o. male who presents for a follow up visit regarding peripheral arterial disease. The patient has known history of mild nonobstructive coronary artery disease,Chronic kidney disease, hypertension and diabetes. He quit smoking more than 40 years ago. He had bladder cancer in 2012 treated with chemotherapy. He was seen recently for bilateral calf and feet pain which is equal in both sides and happen at rest as well as with activities. He also has a lot of bilateral foot numbness.  He had noninvasive vascular evaluation in March of this year which showed an ABI of 1.0 on the right and 0.71 on the left. There was mild nonobstructive bilateral SFA disease. There was one-vessel runoff bilaterally below the knee. His symptoms did not improve with cilostazol or gabapentin. When I saw him last, he had no lower extremity ulceration. However, he had a chronic callus on the left medial part of first metatarsal space. He attempted to remove the callus and subsequently developed an open wound in that area. He was seen by Dr. Con Memos at the wound clinic and was referred back given this new finding. The patient was started on wound care and antibiotics with minimal improvement.    Past Medical History  Diagnosis Date  . BENIGN PROSTATIC HYPERTROPHY 05/21/2007  . DIABETES MELLITUS, TYPE II 05/21/2007  . DIVERTICULOSIS, COLON 05/21/2007    pt denies  . HYPERTENSION 05/21/2007  . PSORIASIS, SCALP 10/25/2008  . Cellulitis of left foot     Hospitalized in 2006  . Gross hematuria     pt denies  . Psoriasis ELBOWS  . Fatigue AGE-RELATED  . Arthritis   . CAD (coronary artery disease)  CARDIOLOGIST- DR WALL - VISIT IN JUNE 2012 FOR CATH    nonobstructive by cath 6/12:  mid LAD 30%, proximal obtuse marginal-2 30%, proximal RCA 20%, mid RCA 30-40%.  He had normal cardiac output and mildly elevated filling pressures but no significant pulmonary hypertension;   Echocardiogram in May 2012 demonstrated EF 50-55% and left atrial enlargement   . Impaired hearing BILATERAL HEARING AIDS  . Bladder cancer ([place])     2013  . Chronic kidney disease (CKD), stage III (moderate) 12/04/2007    FOLLOWED BY DR PATEL  . Back pain     s/p lumbar injection 2014    Past Surgical History  Procedure Laterality Date  . Vasectomy  15 YRS AGO  W/ ANES.  Marland Kitchen Penile prosthesis implant  15 YRS AGO  . Cardiovascular stress test  2007  . Cardiac catheterization  04-24-11/  DR Nigil Braman    MILD NONOBSTRUCTIVE CAD, NORMA CARDIAC OUTPUT  . Appendectomy  AGE 67  . Tonsillectomy and adenoidectomy  AGE 89 YRS AGO  . Cataract extraction w/ intraocular lens  implant, bilateral    . Incision and drainage foot  LEFT FOOT DUE TO INFECTION FROM  NAIL PUNCTURE INJURY  . Cystoscopy w/ retrogrades  10/30/2011    Procedure: CYSTOSCOPY WITH RETROGRADE PYELOGRAM;  Surgeon: Molli Hazard, MD;  Location: [facility];  Service: Urology;  Laterality: Bilateral;  CYSTOSCOPY POSS TURBT BILATERAL RETROGRADE  PYLEOGRAM POSS BILATERAL URETEROSCOPY WITH BIOPSY LASER ABLATION OF LESION POSS BILATERAL URETERAL STENT PLACEMENT   CARM FLEXIBLE URETERAL SCOPE DIGITAL URETERAL SCOPE PK GYRUS HO  . Transurethral resection of bladder tumor  10/30/2011    Procedure: TRANSURETHRAL RESECTION OF BLADDER TUMOR (TURBT);  Surgeon: Molli Hazard, MD;  Location: [facility];  Service: Urology;  Laterality: N/A;  . Cystoscopy  12/25/2011    Procedure: CYSTOSCOPY;  Surgeon: Molli Hazard, MD;  Location: [facility];  Service: Urology;  Laterality: N/A;  needs intubation and  to be paralyzed   . Transurethral resection of bladder tumor  12/25/2011    Procedure: TRANSURETHRAL RESECTION OF BLADDER TUMOR (TURBT);  Surgeon: Molli Hazard, MD;  Location: [facility];  Service: Urology;  Laterality: N/A;  need long gyrus instruments Dr states he has spoken w/ Nelwyn Salisbury regarding this    . Cystoscopy w/ retrogrades  11/30/2012    Procedure: CYSTOSCOPY WITH RETROGRADE PYELOGRAM;  Surgeon: Molli Hazard, MD;  Location: [facility];  Service: Urology;  Laterality: Bilateral;  Flexible cystoscopy.  . Cystoscopy with biopsy  11/30/2012    Procedure: CYSTOSCOPY WITH BIOPSY;  Surgeon: Molli Hazard, MD;  Location: [facility];  Service: Urology;  Laterality: N/A;     Current Outpatient Prescriptions  Medication Sig Dispense Refill  . amLODipine (NORVASC) 5 MG tablet Take 1 tablet (5 mg total) by mouth daily.    Marland Kitchen atorvastatin (LIPITOR) 10 MG tablet Take 1 tablet (10 mg total) by mouth daily. 30 tablet 11  . budesonide-formoterol (SYMBICORT) 160-4.5 MCG/ACT inhaler Inhale 2 puffs into the lungs 2 (two) times daily as needed.    . cephALEXin (KEFLEX) 500 MG capsule Take 1 capsule (500 mg total) by mouth 3 (three) times daily. 30 capsule 0  . clobetasol (TEMOVATE) 0.05 % external solution Apply 1 application topically daily.    . clopidogrel (PLAVIX) 75 MG tablet Take 1 tablet by mouth  daily 90 tablet 1  . furosemide (LASIX) 40 MG tablet TAKE 1 TABLET BY MOUTH  EVERY MONDAY, WEDNESDAY,  AND FRIDAY 45 tablet 1  . gabapentin (NEURONTIN) 100 MG capsule Take 1 capsule (100 mg total) by mouth 3 (three) times daily. 90 capsule 11  . glipiZIDE (GLUCOTROL XL) 5 MG 24 hr tablet Take 1 tablet by mouth  daily with breakfast 90 tablet 1  . insulin glargine (LANTUS) 100 UNIT/ML injection Inject 25 Units into the skin daily at 3 pm.    . acetaminophen (TYLENOL) 500 MG tablet Take 1,000 mg by mouth every 8 (eight) hours as needed  for mild pain, moderate pain or headache.     No current facility-administered medications for this visit.    Allergies:   Actos; Aspirin; Ace inhibitors; Gabapentin; Lyrica; Pravastatin; Bactrim; and Sulfa drugs cross reactors    Social History:  The patient  reports that he quit smoking about 47 years ago. His smoking use included Cigarettes. He has a 44 pack-year smoking history. He has never used smokeless tobacco. He reports that he drinks about 1.2 oz of alcohol per week. He reports that he does not use illicit drugs.   Family History:  The patient's family history includes Diabetes in his mother; Heart disease in his brother, brother, brother, brother, brother, and brother; Lung cancer in his brother.    ROS:  Please see the history of present illness.   Otherwise, review of systems are positive for none.   All  other systems are reviewed and negative.    PHYSICAL EXAM: VS:  BP 120/60 mmHg  Pulse 67  Ht 6\' 2"  (1.88 m)  Wt 217 lb 12 oz (98.771 kg)  BMI 27.95 kg/m2 , BMI Body mass index is 27.95 kg/(m^2). GEN: Well nourished, well developed, in no acute distress HEENT: normal Neck: no JVD, carotid bruits, or masses Cardiac: RRR; no murmurs, rubs, or gallops,no edema  Respiratory:  clear to auscultation bilaterally, normal work of breathing GI: soft, nontender, nondistended, + BS MS: no deformity or atrophy Skin: warm and dry, no rash Neuro:  Strength and sensation are intact Psych: euthymic mood, full affect Vascular: Femoral pulses are normal bilaterally. Distal pulses are not palpable with cold feet. He has a 1 x 1 cm necrotic ulcer on the left medial metatarsal space  EKG:  EKG is ordered today. It showed normal sinus rhythm with no significant ST or T wave changes.    Recent Labs: 10/30/2015: BUN 44*; Creatinine, Ser 2.17*; Potassium 4.6; Sodium 141 11/28/2015: ALT 12    Lipid Panel    Component Value Date/Time   CHOL 116* 11/28/2015 0933   TRIG 81 11/28/2015  0933   HDL 43 11/28/2015 0933   CHOLHDL 2.7 11/28/2015 0933   VLDL 16 11/28/2015 0933   LDLCALC 57 11/28/2015 0933   LDLDIRECT 147.6 04/27/2009 1012      Wt Readings from Last 3 Encounters:  02/12/16 217 lb 12 oz (98.771 kg)  02/03/16 217 lb (98.431 kg)  01/16/16 219 lb 4.8 oz (99.474 kg)       ASSESSMENT AND PLAN:   1. Peripheral arterial disease:  He now has nonhealed wound on the left foot. This is likely due to significant below the knee disease. I discussed the case with Dr. Con Memos. The patient has very little chance of healing this ulcer without some form of revascularization. This was discussed with him and his family extensively. I have recommended proceeding with urgent lower extremity angiography and possible endovascular intervention. I discussed the procedure in detail as well as recent benefits. His biggest risk is contrast-induced nephropathy given that his GFR is around 30. I will plan on doing CO2 angiography for his aortoiliac arteries and then performing angiography only on the left lower extremity. I will bring him in early for hydration and keep him overnight as well. Continue treatment with Plavix.   2. Hyperlipidemia: Continue treatment with atorvastatin with a target LDL of less than 70.   3. Chronic kidney disease: High risk for contrast-induced nephropathy. Follow the precautions as outlined above.      Disposition:   FU with me in 1 months  Signed,  [name], MD  02/12/2016 7:26 PM    [place]

## 2016-02-13 LAB — CBC
Hematocrit: 42.9 % (ref 37.5–51.0)
Hemoglobin: 14.6 g/dL (ref 12.6–17.7)
MCH: 33 pg (ref 26.6–33.0)
MCHC: 34 g/dL (ref 31.5–35.7)
MCV: 97 fL (ref 79–97)
Platelets: 245 10*3/uL (ref 150–379)
RBC: 4.43 x10E6/uL (ref 4.14–5.80)
RDW: 13.3 % (ref 12.3–15.4)
WBC: 8.7 10*3/uL (ref 3.4–10.8)

## 2016-02-13 LAB — BASIC METABOLIC PANEL
BUN/Creatinine Ratio: 18 (ref 10–24)
BUN: 36 mg/dL — ABNORMAL HIGH (ref 8–27)
CO2: 26 mmol/L (ref 18–29)
Calcium: 8.9 mg/dL (ref 8.6–10.2)
Chloride: 98 mmol/L (ref 96–106)
Creatinine, Ser: 1.96 mg/dL — ABNORMAL HIGH (ref 0.76–1.27)
GFR calc Af Amer: 35 mL/min/{1.73_m2} — ABNORMAL LOW (ref >59)
GFR calc non Af Amer: 30 mL/min/{1.73_m2} — ABNORMAL LOW (ref >59)
Glucose: 147 mg/dL — ABNORMAL HIGH (ref 65–99)
Potassium: 5.1 mmol/L (ref 3.5–5.2)
Sodium: 142 mmol/L (ref 134–144)

## 2016-02-13 LAB — PROTIME-INR
INR: 1 (ref 0.8–1.2)
Prothrombin Time: 10 s (ref 9.1–12.0)

## 2016-02-14 ENCOUNTER — Encounter (HOSPITAL_COMMUNITY)
Admission: RE | Disposition: A | Discharge status: Home / Self Care | Payer: Self-pay | Source: Ambulatory Visit | Attending: Cardiovascular Disease

## 2016-02-14 ENCOUNTER — Ambulatory Visit (HOSPITAL_COMMUNITY)
Admission: RE | Admit: 2016-02-14 | Discharge: 2016-02-14 | Disposition: A | Discharge status: Home / Self Care | Payer: Medicare Other | Source: Ambulatory Visit | Attending: Cardiovascular Disease | Admitting: Cardiovascular Disease

## 2016-02-14 DIAGNOSIS — E785 Hyperlipidemia, unspecified: Secondary | ICD-10-CM | POA: Diagnosis not present

## 2016-02-14 DIAGNOSIS — M1990 Unspecified osteoarthritis, unspecified site: Secondary | ICD-10-CM | POA: Insufficient documentation

## 2016-02-14 DIAGNOSIS — Z7902 Long term (current) use of antithrombotics/antiplatelets: Secondary | ICD-10-CM | POA: Insufficient documentation

## 2016-02-14 DIAGNOSIS — N400 Enlarged prostate without lower urinary tract symptoms: Secondary | ICD-10-CM | POA: Diagnosis not present

## 2016-02-14 DIAGNOSIS — Z9221 Personal history of antineoplastic chemotherapy: Secondary | ICD-10-CM | POA: Diagnosis not present

## 2016-02-14 DIAGNOSIS — I7092 Chronic total occlusion of artery of the extremities: Secondary | ICD-10-CM | POA: Insufficient documentation

## 2016-02-14 DIAGNOSIS — E1122 Type 2 diabetes mellitus with diabetic chronic kidney disease: Secondary | ICD-10-CM | POA: Diagnosis not present

## 2016-02-14 DIAGNOSIS — N183 Chronic kidney disease, stage 3 (moderate): Secondary | ICD-10-CM | POA: Diagnosis not present

## 2016-02-14 DIAGNOSIS — Z87891 Personal history of nicotine dependence: Secondary | ICD-10-CM | POA: Diagnosis not present

## 2016-02-14 DIAGNOSIS — L409 Psoriasis, unspecified: Secondary | ICD-10-CM | POA: Diagnosis not present

## 2016-02-14 DIAGNOSIS — I70245 Atherosclerosis of native arteries of left leg with ulceration of other part of foot: Secondary | ICD-10-CM | POA: Diagnosis not present

## 2016-02-14 DIAGNOSIS — I739 Peripheral vascular disease, unspecified: Secondary | ICD-10-CM | POA: Diagnosis not present

## 2016-02-14 DIAGNOSIS — Z8551 Personal history of malignant neoplasm of bladder: Secondary | ICD-10-CM | POA: Insufficient documentation

## 2016-02-14 DIAGNOSIS — I129 Hypertensive chronic kidney disease with stage 1 through stage 4 chronic kidney disease, or unspecified chronic kidney disease: Secondary | ICD-10-CM | POA: Diagnosis not present

## 2016-02-14 DIAGNOSIS — L97529 Non-pressure chronic ulcer of other part of left foot with unspecified severity: Secondary | ICD-10-CM | POA: Insufficient documentation

## 2016-02-14 DIAGNOSIS — Z794 Long term (current) use of insulin: Secondary | ICD-10-CM | POA: Insufficient documentation

## 2016-02-14 DIAGNOSIS — I2510 Atherosclerotic heart disease of native coronary artery without angina pectoris: Secondary | ICD-10-CM | POA: Diagnosis not present

## 2016-02-14 DIAGNOSIS — H9190 Unspecified hearing loss, unspecified ear: Secondary | ICD-10-CM | POA: Insufficient documentation

## 2016-02-14 HISTORY — PX: PERIPHERAL VASCULAR CATHETERIZATION: SHX172C

## 2016-02-14 LAB — BASIC METABOLIC PANEL
Anion gap: 9 (ref 5–15)
BUN: 30 mg/dL — ABNORMAL HIGH (ref 6–20)
CO2: 31 mmol/L (ref 22–32)
Calcium: 8.6 mg/dL — ABNORMAL LOW (ref 8.9–10.3)
Chloride: 102 mmol/L (ref 101–111)
Creatinine, Ser: 1.88 mg/dL — ABNORMAL HIGH (ref 0.61–1.24)
GFR calc Af Amer: 36 mL/min — ABNORMAL LOW (ref >60)
GFR calc non Af Amer: 31 mL/min — ABNORMAL LOW (ref >60)
Glucose, Bld: 122 mg/dL — ABNORMAL HIGH (ref 65–99)
Potassium: 4.5 mmol/L (ref 3.5–5.1)
Sodium: 142 mmol/L (ref 135–145)

## 2016-02-14 LAB — GLUCOSE, CAPILLARY
Glucose-Capillary: 96 mg/dL (ref 65–99)
Glucose-Capillary: 108 mg/dL — ABNORMAL HIGH (ref 65–99)

## 2016-02-14 SURGERY — ABDOMINAL AORTOGRAM W/LOWER EXTREMITY

## 2016-02-14 MED ORDER — LIDOCAINE HCL (PF) 1 % IJ SOLN
INTRAMUSCULAR | Status: DC | PRN
  Administered 2016-02-14: 25 mL

## 2016-02-14 MED ORDER — SODIUM CHLORIDE 0.9 % WEIGHT BASED INFUSION: 1.0000 mL/kg/h | INTRAVENOUS | Status: DC

## 2016-02-14 MED ORDER — LIDOCAINE HCL (PF) 1 % IJ SOLN
INTRAMUSCULAR | Status: AC
  Filled 2016-02-14: qty 30

## 2016-02-14 MED ORDER — SODIUM CHLORIDE 0.9 % IV SOLN: 250.0000 mL | INTRAVENOUS | Status: DC | PRN

## 2016-02-14 MED ORDER — FENTANYL CITRATE (PF) 100 MCG/2ML IJ SOLN
INTRAMUSCULAR | Status: AC
  Filled 2016-02-14: qty 2

## 2016-02-14 MED ORDER — SODIUM CHLORIDE 0.9 % IV SOLN: INTRAVENOUS | Status: DC

## 2016-02-14 MED ORDER — HEPARIN (PORCINE) IN NACL 2-0.9 UNIT/ML-% IJ SOLN
INTRAMUSCULAR | Status: DC | PRN
  Administered 2016-02-14: 1000 mL

## 2016-02-14 MED ORDER — SODIUM CHLORIDE 0.9 % WEIGHT BASED INFUSION
3.0000 mL/kg/h | INTRAVENOUS | Status: AC
  Administered 2016-02-14: 3 mL/kg/h via INTRAVENOUS

## 2016-02-14 MED ORDER — HEPARIN (PORCINE) IN NACL 2-0.9 UNIT/ML-% IJ SOLN
INTRAMUSCULAR | Status: AC
  Filled 2016-02-14: qty 1000

## 2016-02-14 MED ORDER — SODIUM CHLORIDE 0.9% FLUSH: 3.0000 mL | INTRAVENOUS | Status: DC | PRN

## 2016-02-14 MED ORDER — FENTANYL CITRATE (PF) 100 MCG/2ML IJ SOLN
INTRAMUSCULAR | Status: DC | PRN
  Administered 2016-02-14: 50 ug via INTRAVENOUS

## 2016-02-14 MED ORDER — MIDAZOLAM HCL 2 MG/2ML IJ SOLN
INTRAMUSCULAR | Status: DC | PRN
  Administered 2016-02-14: 1 mg via INTRAVENOUS

## 2016-02-14 MED ORDER — SODIUM CHLORIDE 0.9% FLUSH: 3.0000 mL | Freq: Two times a day (BID) | INTRAVENOUS | Status: DC

## 2016-02-14 MED ORDER — IODIXANOL 320 MG/ML IV SOLN
INTRAVENOUS | Status: DC | PRN
  Administered 2016-02-14: 25 mL via INTRAVENOUS

## 2016-02-14 MED ORDER — MIDAZOLAM HCL 2 MG/2ML IJ SOLN
INTRAMUSCULAR | Status: AC
  Filled 2016-02-14: qty 2

## 2016-02-14 SURGICAL SUPPLY — 14 items
CATH ANGIO 5F PIGTAIL 65CM (CATHETERS) ×1 IMPLANT
CATH CROSS OVER TEMPO 5F (CATHETERS) IMPLANT
CATH STRAIGHT 5FR 65CM (CATHETERS) ×1 IMPLANT
FILTER CO2 0.2 MICRON (VASCULAR PRODUCTS) ×1 IMPLANT
KIT PV (KITS) ×2 IMPLANT
RESERVOIR CO2 (VASCULAR PRODUCTS) ×1 IMPLANT
SET FLUSH CO2 (MISCELLANEOUS) ×1 IMPLANT
SHEATH PINNACLE 5F 10CM (SHEATH) ×1 IMPLANT
STOPCOCK MORSE 400PSI 3WAY (MISCELLANEOUS) ×1 IMPLANT
SYRINGE MEDRAD AVANTA MACH 7 (SYRINGE) ×1 IMPLANT
TRANSDUCER W/STOPCOCK (MISCELLANEOUS) ×2 IMPLANT
TRAY PV CATH (CUSTOM PROCEDURE TRAY) ×2 IMPLANT
TUBING CIL FLEX 10 FLL-RA (TUBING) ×1 IMPLANT
WIRE HITORQ VERSACORE ST 145CM (WIRE) ×2 IMPLANT

## 2016-02-14 NOTE — H&P (View-Only) (Signed)
Cardiology Office Note   Date:  02/12/2016   ID:  [name] Small, DOB 1928/02/19, MRN [id number]  PCP:  [name], MD  Cardiologist:   [name], MD   Chief Complaint  Patient presents with  . other    See telephone note. Meds reviewed verbally with pt.      History of Present Illness: [name] Small is a 82 y.o. male who presents for a follow up visit regarding peripheral arterial disease. The patient has known history of mild nonobstructive coronary artery disease,Chronic kidney disease, hypertension and diabetes. He quit smoking more than 40 years ago. He had bladder cancer in 2012 treated with chemotherapy. He was seen recently for bilateral calf and feet pain which is equal in both sides and happen at rest as well as with activities. He also has a lot of bilateral foot numbness.  He had noninvasive vascular evaluation in March of this year which showed an ABI of 1.0 on the right and 0.71 on the left. There was mild nonobstructive bilateral SFA disease. There was one-vessel runoff bilaterally below the knee. His symptoms did not improve with cilostazol or gabapentin. When I saw him last, he had no lower extremity ulceration. However, he had a chronic callus on the left medial part of first metatarsal space. He attempted to remove the callus and subsequently developed an open wound in that area. He was seen by Dr. Con Memos at the wound clinic and was referred back given this new finding. The patient was started on wound care and antibiotics with minimal improvement.    Past Medical History  Diagnosis Date  . BENIGN PROSTATIC HYPERTROPHY 05/21/2007  . DIABETES MELLITUS, TYPE II 05/21/2007  . DIVERTICULOSIS, COLON 05/21/2007    pt denies  . HYPERTENSION 05/21/2007  . PSORIASIS, SCALP 10/25/2008  . Cellulitis of left foot     Hospitalized in 2006  . Gross hematuria     pt denies  . Psoriasis ELBOWS  . Fatigue AGE-RELATED  . Arthritis   . CAD (coronary artery disease)  CARDIOLOGIST- DR WALL - VISIT IN JUNE 2012 FOR CATH    nonobstructive by cath 6/12:  mid LAD 30%, proximal obtuse marginal-2 30%, proximal RCA 20%, mid RCA 30-40%.  He had normal cardiac output and mildly elevated filling pressures but no significant pulmonary hypertension;   Echocardiogram in May 2012 demonstrated EF 50-55% and left atrial enlargement   . Impaired hearing BILATERAL HEARING AIDS  . Bladder cancer ([place])     2013  . Chronic kidney disease (CKD), stage III (moderate) 12/04/2007    FOLLOWED BY DR PATEL  . Back pain     s/p lumbar injection 2014    Past Surgical History  Procedure Laterality Date  . Vasectomy  15 YRS AGO  W/ ANES.  Marland Kitchen Penile prosthesis implant  15 YRS AGO  . Cardiovascular stress test  2007  . Cardiac catheterization  04-24-11/  DR Vikki Gains    MILD NONOBSTRUCTIVE CAD, NORMA CARDIAC OUTPUT  . Appendectomy  AGE 89  . Tonsillectomy and adenoidectomy  AGE 74 YRS AGO  . Cataract extraction w/ intraocular lens  implant, bilateral    . Incision and drainage foot  LEFT FOOT DUE TO INFECTION FROM  NAIL PUNCTURE INJURY  . Cystoscopy w/ retrogrades  10/30/2011    Procedure: CYSTOSCOPY WITH RETROGRADE PYELOGRAM;  Surgeon: Molli Hazard, MD;  Location: [facility];  Service: Urology;  Laterality: Bilateral;  CYSTOSCOPY POSS TURBT BILATERAL RETROGRADE  PYLEOGRAM POSS BILATERAL URETEROSCOPY WITH BIOPSY LASER ABLATION OF LESION POSS BILATERAL URETERAL STENT PLACEMENT   CARM FLEXIBLE URETERAL SCOPE DIGITAL URETERAL SCOPE PK GYRUS HO  . Transurethral resection of bladder tumor  10/30/2011    Procedure: TRANSURETHRAL RESECTION OF BLADDER TUMOR (TURBT);  Surgeon: Molli Hazard, MD;  Location: [facility];  Service: Urology;  Laterality: N/A;  . Cystoscopy  12/25/2011    Procedure: CYSTOSCOPY;  Surgeon: Molli Hazard, MD;  Location: [facility];  Service: Urology;  Laterality: N/A;  needs intubation and  to be paralyzed   . Transurethral resection of bladder tumor  12/25/2011    Procedure: TRANSURETHRAL RESECTION OF BLADDER TUMOR (TURBT);  Surgeon: Molli Hazard, MD;  Location: [facility];  Service: Urology;  Laterality: N/A;  need long gyrus instruments Dr states he has spoken w/ Nelwyn Salisbury regarding this    . Cystoscopy w/ retrogrades  11/30/2012    Procedure: CYSTOSCOPY WITH RETROGRADE PYELOGRAM;  Surgeon: Molli Hazard, MD;  Location: [facility];  Service: Urology;  Laterality: Bilateral;  Flexible cystoscopy.  . Cystoscopy with biopsy  11/30/2012    Procedure: CYSTOSCOPY WITH BIOPSY;  Surgeon: Molli Hazard, MD;  Location: [facility];  Service: Urology;  Laterality: N/A;     Current Outpatient Prescriptions  Medication Sig Dispense Refill  . amLODipine (NORVASC) 5 MG tablet Take 1 tablet (5 mg total) by mouth daily.    Marland Kitchen atorvastatin (LIPITOR) 10 MG tablet Take 1 tablet (10 mg total) by mouth daily. 30 tablet 11  . budesonide-formoterol (SYMBICORT) 160-4.5 MCG/ACT inhaler Inhale 2 puffs into the lungs 2 (two) times daily as needed.    . cephALEXin (KEFLEX) 500 MG capsule Take 1 capsule (500 mg total) by mouth 3 (three) times daily. 30 capsule 0  . clobetasol (TEMOVATE) 0.05 % external solution Apply 1 application topically daily.    . clopidogrel (PLAVIX) 75 MG tablet Take 1 tablet by mouth  daily 90 tablet 1  . furosemide (LASIX) 40 MG tablet TAKE 1 TABLET BY MOUTH  EVERY MONDAY, WEDNESDAY,  AND FRIDAY 45 tablet 1  . gabapentin (NEURONTIN) 100 MG capsule Take 1 capsule (100 mg total) by mouth 3 (three) times daily. 90 capsule 11  . glipiZIDE (GLUCOTROL XL) 5 MG 24 hr tablet Take 1 tablet by mouth  daily with breakfast 90 tablet 1  . insulin glargine (LANTUS) 100 UNIT/ML injection Inject 25 Units into the skin daily at 3 pm.    . acetaminophen (TYLENOL) 500 MG tablet Take 1,000 mg by mouth every 8 (eight) hours as needed  for mild pain, moderate pain or headache.     No current facility-administered medications for this visit.    Allergies:   Actos; Aspirin; Ace inhibitors; Gabapentin; Lyrica; Pravastatin; Bactrim; and Sulfa drugs cross reactors    Social History:  The patient  reports that he quit smoking about 47 years ago. His smoking use included Cigarettes. He has a 44 pack-year smoking history. He has never used smokeless tobacco. He reports that he drinks about 1.2 oz of alcohol per week. He reports that he does not use illicit drugs.   Family History:  The patient's family history includes Diabetes in his mother; Heart disease in his brother, brother, brother, brother, brother, and brother; Lung cancer in his brother.    ROS:  Please see the history of present illness.   Otherwise, review of systems are positive for none.   All  other systems are reviewed and negative.    PHYSICAL EXAM: VS:  BP 120/60 mmHg  Pulse 67  Ht 6\' 2"  (1.88 m)  Wt 217 lb 12 oz (98.771 kg)  BMI 27.95 kg/m2 , BMI Body mass index is 27.95 kg/(m^2). GEN: Well nourished, well developed, in no acute distress HEENT: normal Neck: no JVD, carotid bruits, or masses Cardiac: RRR; no murmurs, rubs, or gallops,no edema  Respiratory:  clear to auscultation bilaterally, normal work of breathing GI: soft, nontender, nondistended, + BS MS: no deformity or atrophy Skin: warm and dry, no rash Neuro:  Strength and sensation are intact Psych: euthymic mood, full affect Vascular: Femoral pulses are normal bilaterally. Distal pulses are not palpable with cold feet. He has a 1 x 1 cm necrotic ulcer on the left medial metatarsal space  EKG:  EKG is ordered today. It showed normal sinus rhythm with no significant ST or T wave changes.    Recent Labs: 10/30/2015: BUN 44*; Creatinine, Ser 2.17*; Potassium 4.6; Sodium 141 11/28/2015: ALT 12    Lipid Panel    Component Value Date/Time   CHOL 116* 11/28/2015 0933   TRIG 81 11/28/2015  0933   HDL 43 11/28/2015 0933   CHOLHDL 2.7 11/28/2015 0933   VLDL 16 11/28/2015 0933   LDLCALC 57 11/28/2015 0933   LDLDIRECT 147.6 04/27/2009 1012      Wt Readings from Last 3 Encounters:  02/12/16 217 lb 12 oz (98.771 kg)  02/03/16 217 lb (98.431 kg)  01/16/16 219 lb 4.8 oz (99.474 kg)       ASSESSMENT AND PLAN:   1. Peripheral arterial disease:  He now has nonhealed wound on the left foot. This is likely due to significant below the knee disease. I discussed the case with Dr. Con Memos. The patient has very little chance of healing this ulcer without some form of revascularization. This was discussed with him and his family extensively. I have recommended proceeding with urgent lower extremity angiography and possible endovascular intervention. I discussed the procedure in detail as well as recent benefits. His biggest risk is contrast-induced nephropathy given that his GFR is around 30. I will plan on doing CO2 angiography for his aortoiliac arteries and then performing angiography only on the left lower extremity. I will bring him in early for hydration and keep him overnight as well. Continue treatment with Plavix.   2. Hyperlipidemia: Continue treatment with atorvastatin with a target LDL of less than 70.   3. Chronic kidney disease: High risk for contrast-induced nephropathy. Follow the precautions as outlined above.      Disposition:   FU with me in 1 months  Signed,  [name], MD  02/12/2016 7:26 PM    [place]

## 2016-02-14 NOTE — Interval H&P Note (Signed)
History and Physical Interval Note:  02/14/2016 10:35 AM  [name] Small  has presented today for surgery, with the diagnosis of pad  The various methods of treatment have been discussed with the patient and family. After consideration of risks, benefits and other options for treatment, the patient has consented to  Procedure(s): Abdominal Aortogram w/Lower Extremity (N/A) as a surgical intervention .  The patient's history has been reviewed, patient examined, no change in status, stable for surgery.  I have reviewed the patient's chart and labs.  Questions were answered to the patient's satisfaction.     [name]

## 2016-02-14 NOTE — Progress Notes (Addendum)
Site area: RFA Site Prior to Removal:  Level 0 Pressure Applied For:20 min Manual: yes   Patient Status During Pull:stable   Post Pull Site:  Level 0 Post Pull Instructions Given:yes   Post Pull Pulses Present:doppler  Dressing Applied:tegaderm   Bedrest begins @ 1225 till 1625 Comments:

## 2016-02-14 NOTE — Discharge Instructions (Signed)

## 2016-02-15 ENCOUNTER — Encounter: Payer: Medicare Other | Attending: Surgery | Admitting: Surgery

## 2016-02-15 ENCOUNTER — Other Ambulatory Visit: Payer: Medicare Other

## 2016-02-15 ENCOUNTER — Encounter (HOSPITAL_COMMUNITY): Payer: Self-pay | Admitting: Cardiovascular Disease

## 2016-02-15 DIAGNOSIS — E1140 Type 2 diabetes mellitus with diabetic neuropathy, unspecified: Secondary | ICD-10-CM | POA: Diagnosis not present

## 2016-02-15 DIAGNOSIS — N183 Chronic kidney disease, stage 3 (moderate): Secondary | ICD-10-CM | POA: Insufficient documentation

## 2016-02-15 DIAGNOSIS — J449 Chronic obstructive pulmonary disease, unspecified: Secondary | ICD-10-CM | POA: Diagnosis not present

## 2016-02-15 DIAGNOSIS — Z794 Long term (current) use of insulin: Secondary | ICD-10-CM | POA: Insufficient documentation

## 2016-02-15 DIAGNOSIS — Z992 Dependence on renal dialysis: Secondary | ICD-10-CM | POA: Diagnosis not present

## 2016-02-15 DIAGNOSIS — Z8551 Personal history of malignant neoplasm of bladder: Secondary | ICD-10-CM | POA: Diagnosis not present

## 2016-02-15 DIAGNOSIS — I739 Peripheral vascular disease, unspecified: Secondary | ICD-10-CM | POA: Diagnosis not present

## 2016-02-15 DIAGNOSIS — I129 Hypertensive chronic kidney disease with stage 1 through stage 4 chronic kidney disease, or unspecified chronic kidney disease: Secondary | ICD-10-CM | POA: Insufficient documentation

## 2016-02-15 DIAGNOSIS — L97521 Non-pressure chronic ulcer of other part of left foot limited to breakdown of skin: Secondary | ICD-10-CM | POA: Diagnosis not present

## 2016-02-15 DIAGNOSIS — L97522 Non-pressure chronic ulcer of other part of left foot with fat layer exposed: Secondary | ICD-10-CM | POA: Diagnosis not present

## 2016-02-15 DIAGNOSIS — Z87891 Personal history of nicotine dependence: Secondary | ICD-10-CM | POA: Insufficient documentation

## 2016-02-15 DIAGNOSIS — I70245 Atherosclerosis of native arteries of left leg with ulceration of other part of foot: Secondary | ICD-10-CM | POA: Insufficient documentation

## 2016-02-15 DIAGNOSIS — E11621 Type 2 diabetes mellitus with foot ulcer: Secondary | ICD-10-CM | POA: Diagnosis not present

## 2016-02-15 DIAGNOSIS — I2510 Atherosclerotic heart disease of native coronary artery without angina pectoris: Secondary | ICD-10-CM | POA: Insufficient documentation

## 2016-02-16 NOTE — Progress Notes (Addendum)
[name], Small ([id number]) Visit Report for 02/15/2016 Chief Complaint Document Details Patient Name: [name] Small, [name] Small. Date of Service: 02/15/2016 2:15 PM Medical Record Number: [id number] Patient Account Number: [id number] Date of Birth/Sex: [birth year] (82 y.o. Male) Treating RN: Montey Hora Primary Care Physician: [name] Other Clinician: Referring Physician: [name] Treating Physician/Extender: Frann Rider in Treatment: 1 Information Obtained from: Patient Chief Complaint Patient presents to the wound care center today with an open arterial ulcer to the left medial forefoot and is also known to be a diabetic and has had these problems at least for 6 weeks Electronic Signature(s) Signed: 02/15/2016 3:59:31 PM By: Christin Fudge MD, FACS Entered By: Christin Fudge on 02/15/2016 15:59:31 [name] Small ([id number]) -------------------------------------------------------------------------------- Debridement Details Patient Name: [name] Small Date of Service: 02/15/2016 2:15 PM Medical Record Number: [id number] Patient Account Number: [id number] Date of Birth/Sex: 05/03/1928 (82 y.o. Male) Treating RN: Montey Hora Primary Care Physician: [name] Other Clinician: Referring Physician: [name] Treating Physician/Extender: Frann Rider in Treatment: 1 Debridement Performed for Wound #1 Left,Medial Metatarsal head first Assessment: Performed By: Physician Christin Fudge, MD Debridement: Debridement Pre-procedure Yes Verification/Time Out Taken: Start Time: 14:33 Pain Control: Lidocaine 4% Topical Solution Level: Skin/Subcutaneous Tissue Total Area Debrided (L x 1.8 (cm) x 1.3 (cm) = 2.34 (cm) W): Tissue and other Viable, Non-Viable, Eschar, Fibrin/Slough, Skin, Subcutaneous material debrided: Instrument: Forceps, Scissors Bleeding: Minimum Hemostasis Achieved: Pressure End Time: 14:36 Procedural Pain: 0 Post Procedural  Pain: 0 Response to Treatment: Procedure was tolerated well Post Debridement Measurements of Total Wound Length: (cm) 1.8 Width: (cm) 1.3 Depth: (cm) 0.1 Volume: (cm) 0.184 Post Procedure Diagnosis Same as Pre-procedure Electronic Signature(s) Signed: 02/15/2016 3:59:08 PM By: Christin Fudge MD, FACS Signed: 02/15/2016 5:50:07 PM By: Montey Hora Entered By: Christin Fudge on 02/15/2016 15:59:08 [name] Small ([id number]) -------------------------------------------------------------------------------- HPI Details Patient Name: [name] Small Date of Service: 02/15/2016 2:15 PM Medical Record Number: [id number] Patient Account Number: [id number] Date of Birth/Sex: 22-Feb-1928 (82 y.o. Male) Treating RN: Montey Hora Primary Care Physician: [name] Other Clinician: Referring Physician: [name] Treating Physician/Extender: Frann Rider in Treatment: 1 History of Present Illness Location: ulcerated area on the left medial foot Quality: Patient reports rest pain to affected area(s). he also has typical symptoms of claudication Severity: Patient states wound are getting worse. Duration: Patient has had the wound for < 6 weeks prior to presenting for treatment Timing: Pain in wound is constant (hurts all the time) Context: The wound occurred when the patient patient off of a callus or scab from that area and noticed the ulceration Modifying Factors: Other treatment(s) tried include:hydrogen peroxide and local ointments Associated Signs and Symptoms: Patient reports having:rest pain and typical pain of claudication HPI Description: 82 year old gentleman who was seen by his PCP recently having had a injury to the left medial foot after he peeled of a callus. He has been using clobetasol and peroxide. He's had a history of having peripheral arterial disease in both lower extremities. On inquiring with the patient he has clear symptoms of claudication and has had  this for over a year and a half. Past medical history significant for diabetes mellitus type 2, BPH, diverticulosis, hypertension, psoriasis, cellulitis of the left foot treated with hospitalization in 2006, coronary artery disease, bladder cancer in 2013, chronic kidney disease stage III, status post vasectomy,penile prosthesis, cardiac, appendectomy, tonsillectomy, incision and drainage of foot abscess, patient was recently started on Keflex for 10 days and referred to  the wound clinic. Of note the patient was worked up with vascular tests in March 2016 and was referred to Dr. Gwendalyn Ege who saw him on 10/17/2015 and noted an ABI of 1.0 on the right and 0.71 on the left and there was mild nonobstructive bilateral SFA disease. The patient had a one-vessel runoff bilaterally below the knee and the patient had no lower extremity ulceration. due to the nature of the below knee arterial disease he recommended medical therapy including cilostazol and Plavix. the patient was also put on a small dose of atorvastatin 10 mg daily. the patient was reviewed again on 01/16/2016 and at that stage the ulcer on the left foot was not noted. Medical management was continued and he would be seen in follow-up in 6 months time. Addendum: -- I spoke to Dr. Inda Merlin Aredia's office, and the call was taken by his colleague Dr. Esmond Plants -- he discussed the case with me in detail and would have the patient see Dr. Lovette Cliche on Monday when he was back from vacation. 02/15/2016 -- o I had a call from Dr. Jacqulyn Cane who saw the patient today and agrees that the patient needs an intervention with possibly stenting or angioplasty. He is going to set him up for this. X-ray of the left foot IMPRESSION:No definite evidence of osteomyelitis or fracture. He was taken up for a procedure by Dr. Jacqulyn Cane on 02/14/2016 Conclusion :1. No significant aortoiliac disease.2. Mild diffuse nonobstructive disease affecting the  left SFA. DAMERON, HUGHBANKS ([id number]) 3. Occluded distal left popliteal artery with reconstitution via extensive collaterals into the proximal to mid peroneal artery which extends all the way down to the foot. This is the only patent vessel below the knee. Recommendations:The patient has extensive collaterals and there is a reasonable chance of being able to heal the ulcer without revascularization. I recommend wound care as being done. If there is no improvement within 2 weeks, then an endovascular attempt of opening the popliteal occlusion into the peroneal artery will be pursued. Electronic Signature(s) Signed: 02/15/2016 3:59:43 PM By: Christin Fudge MD, FACS Previous Signature: 02/15/2016 2:26:09 PM Version By: Christin Fudge MD, FACS Entered By: Christin Fudge on 02/15/2016 15:59:43 [name] Small ([id number]) -------------------------------------------------------------------------------- Physical Exam Details Patient Name: [name] Small Date of Service: 02/15/2016 2:15 PM Medical Record Number: [id number] Patient Account Number: [id number] Date of Birth/Sex: Feb 06, 1928 (82 y.o. Male) Treating RN: Montey Hora Primary Care Physician: [name] Other Clinician: Referring Physician: [name] Treating Physician/Extender: Frann Rider in Treatment: 1 Constitutional . Pulse regular. Respirations normal and unlabored. Afebrile. . Eyes Nonicteric. Reactive to light. Ears, Nose, Mouth, and Throat Lips, teeth, and gums WNL.Marland Kitchen Moist mucosa without lesions. Neck supple and nontender. No palpable supraclavicular or cervical adenopathy. Normal sized without goiter. Respiratory WNL. No retractions.. Breath sounds WNL, No rubs, rales, rhonchi, or wheeze.. Cardiovascular Heart rhythm and rate regular, no murmur or gallop.. Pedal Pulses WNL. No clubbing, cyanosis or edema. Lymphatic No adneopathy. No adenopathy. No adenopathy. Musculoskeletal Adexa without tenderness or  enlargement.. Digits and nails w/o clubbing, cyanosis, infection, petechiae, ischemia, or inflammatory conditions.. Integumentary (Hair, Skin) No suspicious lesions. No crepitus or fluctuance. No peri-wound warmth or erythema. No masses.Marland Kitchen Psychiatric Judgement and insight Intact.. No evidence of depression, anxiety, or agitation.. Notes the thick eschar and subcutaneous tissue were sharply debrided with forceps and scissors and this was on the medial part of his left forefoot in the region of the first metatarsal. There was no bleeding.  Electronic Signature(s) Signed: 02/15/2016 4:00:56 PM By: Christin Fudge MD, FACS Entered By: Christin Fudge on 02/15/2016 16:00:55 [name] Small ([id number]) -------------------------------------------------------------------------------- Physician Orders Details Patient Name: [name] Small Date of Service: 02/15/2016 2:15 PM Medical Record Number: [id number] Patient Account Number: [id number] Date of Birth/Sex: [birth year] (82 y.o. Male) Treating RN: Montey Hora Primary Care Physician: [name] Other Clinician: Referring Physician: [name] Treating Physician/Extender: Frann Rider in Treatment: 1 Verbal / Phone Orders: Yes Clinician: Montey Hora Read Back and Verified: Yes Diagnosis Coding Wound Cleansing Wound #1 Left,Medial Metatarsal head first o Clean wound with Normal Saline. o Cleanse wound with mild soap and water Anesthetic Wound #1 Left,Medial Metatarsal head first o Topical Lidocaine 4% cream applied to wound bed prior to debridement Primary Wound Dressing Wound #1 Left,Medial Metatarsal head first o Santyl Ointment Secondary Dressing Wound #1 Left,Medial Metatarsal head first o Gauze and Kerlix/Conform Dressing Change Frequency Wound #1 Left,Medial Metatarsal head first o Change dressing every day. Follow-up Appointments Wound #1 Left,Medial Metatarsal head first o Return Appointment in  1 week. Additional Orders / Instructions Wound #1 Left,Medial Metatarsal head first o Increase protein intake. o Other: - work to keep blood sugars under 180 consistently Electronic Signature(s) Signed: 02/15/2016 4:29:21 PM By: Christin Fudge MD, FACS Signed: 02/15/2016 5:50:07 PM By: Scot Jun, Antonieta Loveless ([id number]) Entered By: Montey Hora on 02/15/2016 14:36:50 [name] Small ([id number]) -------------------------------------------------------------------------------- Problem List Details Patient Name: MD, GORY. Date of Service: 02/15/2016 2:15 PM Medical Record Number: [id number] Patient Account Number: [id number] Date of Birth/Sex: 11-22-1927 (82 y.o. Male) Treating RN: Montey Hora Primary Care Physician: [name] Other Clinician: Referring Physician: [name] Treating Physician/Extender: Frann Rider in Treatment: 1 Active Problems ICD-10 Encounter Code Description Active Date Diagnosis E11.621 Type 2 diabetes mellitus with foot ulcer 02/08/2016 Yes I70.245 Atherosclerosis of native arteries of left leg with ulceration 02/08/2016 Yes of other part of foot L97.522 Non-pressure chronic ulcer of other part of left foot with fat 02/08/2016 Yes layer exposed Inactive Problems Resolved Problems Electronic Signature(s) Signed: 02/15/2016 3:58:59 PM By: Christin Fudge MD, FACS Entered By: Christin Fudge on 02/15/2016 15:58:58 [name] Small ([id number]) -------------------------------------------------------------------------------- Progress Note Details Patient Name: [name] Small Date of Service: 02/15/2016 2:15 PM Medical Record Number: [id number] Patient Account Number: [id number] Date of Birth/Sex: 12-11-1927 (82 y.o. Male) Treating RN: Montey Hora Primary Care Physician: [name] Other Clinician: Referring Physician: [name] Treating Physician/Extender: Frann Rider in Treatment: 1 Subjective Chief  Complaint Information obtained from Patient Patient presents to the wound care center today with an open arterial ulcer to the left medial forefoot and is also known to be a diabetic and has had these problems at least for 6 weeks History of Present Illness (HPI) The following HPI elements were documented for the patient's wound: Location: ulcerated area on the left medial foot Quality: Patient reports rest pain to affected area(s). he also has typical symptoms of claudication Severity: Patient states wound are getting worse. Duration: Patient has had the wound for < 6 weeks prior to presenting for treatment Timing: Pain in wound is constant (hurts all the time) Context: The wound occurred when the patient patient off of a callus or scab from that area and noticed the ulceration Modifying Factors: Other treatment(s) tried include:hydrogen peroxide and local ointments Associated Signs and Symptoms: Patient reports having:rest pain and typical pain of claudication 82 year old gentleman who was seen by his PCP recently having had a injury to the left  medial foot after he peeled of a callus. He has been using clobetasol and peroxide. He's had a history of having peripheral arterial disease in both lower extremities. On inquiring with the patient he has clear symptoms of claudication and has had this for over a year and a half. Past medical history significant for diabetes mellitus type 2, BPH, diverticulosis, hypertension, psoriasis, cellulitis of the left foot treated with hospitalization in 2006, coronary artery disease, bladder cancer in 2013, chronic kidney disease stage III, status post vasectomy,penile prosthesis, cardiac, appendectomy, tonsillectomy, incision and drainage of foot abscess, patient was recently started on Keflex for 10 days and referred to the wound clinic. Of note the patient was worked up with vascular tests in March 2016 and was referred to Dr. Gwendalyn Ege who saw  him on 10/17/2015 and noted an ABI of 1.0 on the right and 0.71 on the left and there was mild nonobstructive bilateral SFA disease. The patient had a one-vessel runoff bilaterally below the knee and the patient had no lower extremity ulceration. due to the nature of the below knee arterial disease he recommended medical therapy including cilostazol and Plavix. the patient was also put on a small dose of atorvastatin 10 mg daily. the patient was reviewed again on 01/16/2016 and at that stage the ulcer on the left foot was not noted. Medical management was continued and he would be seen in follow-up in 6 months time. Addendum: -- I spoke to Dr. Inda Merlin Aredia's office, and the call was taken by his colleague Dr. Esmond Plants -- he discussed the case with me in detail and would have the patient see Dr. Lovette Cliche on Monday [place]. ([id number]) when he was back from vacation. 02/15/2016 -- I had a call from Dr. Jacqulyn Cane who saw the patient today and agrees that the patient needs an intervention with possibly stenting or angioplasty. He is going to set him up for this. X-ray of the left foot IMPRESSION:No definite evidence of osteomyelitis or fracture. He was taken up for a procedure by Dr. Jacqulyn Cane on 02/14/2016 Conclusion :1. No significant aortoiliac disease.2. Mild diffuse nonobstructive disease affecting the left SFA. 3. Occluded distal left popliteal artery with reconstitution via extensive collaterals into the proximal to mid peroneal artery which extends all the way down to the foot. This is the only patent vessel below the knee. Recommendations:The patient has extensive collaterals and there is a reasonable chance of being able to heal the ulcer without revascularization. I recommend wound care as being done. If there is no improvement within 2 weeks, then an endovascular attempt of opening the popliteal occlusion into the peroneal artery will be  pursued. Objective Constitutional Pulse regular. Respirations normal and unlabored. Afebrile. Vitals Time Taken: 2:21 PM, Height: 74 in, Weight: 217 lbs, BMI: 27.9, Temperature: 98.0 F, Pulse: 67 bpm, Respiratory Rate: 18 breaths/min, Blood Pressure: 99/68 mmHg. Eyes Nonicteric. Reactive to light. Ears, Nose, Mouth, and Throat Lips, teeth, and gums WNL.Marland Kitchen Moist mucosa without lesions. Neck supple and nontender. No palpable supraclavicular or cervical adenopathy. Normal sized without goiter. Respiratory WNL. No retractions.. Breath sounds WNL, No rubs, rales, rhonchi, or wheeze.. Cardiovascular Heart rhythm and rate regular, no murmur or gallop.. Pedal Pulses WNL. No clubbing, cyanosis or edema. Lymphatic No adneopathy. No adenopathy. No adenopathy. Musculoskeletal Adexa without tenderness or enlargement.. Digits and nails w/o clubbing, cyanosis, infection, petechiae, Couchman, Alexie N. ([id number]) ischemia, or inflammatory conditions.Marland Kitchen Psychiatric Judgement and insight Intact.. No evidence of depression, anxiety, or agitation.Marland Kitchen  General Notes: the thick eschar and subcutaneous tissue were sharply debrided with forceps and scissors and this was on the medial part of his left forefoot in the region of the first metatarsal. There was no bleeding. Integumentary (Hair, Skin) No suspicious lesions. No crepitus or fluctuance. No peri-wound warmth or erythema. No masses.. Wound #1 status is Open. Original cause of wound was Gradually Appeared. The wound is located on the Left,Medial Metatarsal head first. The wound measures 1.8cm length x 1.3cm width x 0.1cm depth; 1.838cm^2 area and 0.184cm^3 volume. The wound is limited to skin breakdown. There is no tunneling or undermining noted. There is a medium amount of sanguinous drainage noted. The wound margin is flat and intact. There is no granulation within the wound bed. There is a large (67-100%) amount of necrotic tissue within the wound bed  including Eschar. The periwound skin appearance exhibited: Callus, Dry/Scaly. The periwound skin appearance did not exhibit: Crepitus, Excoriation, Fluctuance, Friable, Induration, Localized Edema, Rash, Scarring, Maceration, Moist, Atrophie Blanche, Cyanosis, Ecchymosis, Hemosiderin Staining, Mottled, Pallor, Rubor, Erythema. Periwound temperature was noted as No Abnormality. Assessment Active Problems ICD-10 E11.621 - Type 2 diabetes mellitus with foot ulcer I70.245 - Atherosclerosis of native arteries of left leg with ulceration of other part of foot L97.522 - Non-pressure chronic ulcer of other part of left foot with fat layer exposed I have recommended: 1. Santyl ointment locally with a bordered foam to be changed daily 2. X-ray of the left foot -- results reviewed and discussed with the patient 3. Dr. Julien Nordmann Arida's angiogram results discussed with the patient. 4. Reviewing the wound care center for further management. Procedures Wound #1 HYMIE, LOJEK ([id number]) Wound #1 is a Diabetic Wound/Ulcer of the Lower Extremity located on the Left,Medial Metatarsal head first . There was a Skin/Subcutaneous Tissue Debridement [id number]) debridement with total area of 2.34 sq cm performed by Christin Fudge, MD. with the following instrument(s): Forceps and Scissors to remove Viable and Non-Viable tissue/material including Fibrin/Slough, Eschar, Skin, and Subcutaneous after achieving pain control using Lidocaine 4% Topical Solution. A time out was conducted prior to the start of the procedure. A Minimum amount of bleeding was controlled with Pressure. The procedure was tolerated well with a pain level of 0 throughout and a pain level of 0 following the procedure. Post Debridement Measurements: 1.8cm length x 1.3cm width x 0.1cm depth; 0.184cm^3 volume. Post procedure Diagnosis Wound #1: Same as Pre-Procedure Plan Wound Cleansing: Wound #1 Left,Medial Metatarsal head first: Clean  wound with Normal Saline. Cleanse wound with mild soap and water Anesthetic: Wound #1 Left,Medial Metatarsal head first: Topical Lidocaine 4% cream applied to wound bed prior to debridement Primary Wound Dressing: Wound #1 Left,Medial Metatarsal head first: Santyl Ointment Secondary Dressing: Wound #1 Left,Medial Metatarsal head first: Gauze and Kerlix/Conform Dressing Change Frequency: Wound #1 Left,Medial Metatarsal head first: Change dressing every day. Follow-up Appointments: Wound #1 Left,Medial Metatarsal head first: Return Appointment in 1 week. Additional Orders / Instructions: Wound #1 Left,Medial Metatarsal head first: Increase protein intake. Other: - work to keep blood sugars under 180 consistently I have recommended: 1. Santyl ointment locally with a bordered foam to be changed daily LUISMANUEL, SHONTZ. ([id number]) 2. X-ray of the left foot -- results reviewed and discussed with the patient 3. Dr. Julien Nordmann Arida's angiogram results discussed with the patient. 4. Reviewing the wound care center for further management. Electronic Signature(s) Signed: 02/15/2016 4:02:28 PM By: Christin Fudge MD, FACS Entered By: Christin Fudge on 02/15/2016 16:02:28 [place], Antonieta Loveless. (  [id number]) -------------------------------------------------------------------------------- SuperBill Details Patient Name: PETR, HOLTE. Date of Service: 02/15/2016 Medical Record Number: [id number] Patient Account Number: [id number] Date of Birth/Sex: [birth year] (82 y.o. Male) Treating RN: Montey Hora Primary Care Physician: [name] Other Clinician: Referring Physician: [name] Treating Physician/Extender: Frann Rider in Treatment: 1 Diagnosis Coding ICD-10 Codes Code Description [phone] Type 2 diabetes mellitus with foot ulcer I70.245 Atherosclerosis of native arteries of left leg with ulceration of other part of foot L97.522 Non-pressure chronic ulcer of other part of left  foot with fat layer exposed Facility Procedures CPT4: Description Modifier Quantity Code [id number] - DEB SUBQ TISSUE 20 SQ CM/< 1 ICD-10 Description Diagnosis E11.621 Type 2 diabetes mellitus with foot ulcer I70.245 Atherosclerosis of native arteries of left leg with ulceration of other part of  foot L97.522 Non-pressure chronic ulcer of other part of left foot with fat layer exposed Physician Procedures CPT4: Description Modifier Quantity Code [id number] - WC PHYS LEVEL 3 - EST PT 25 1 ICD-10 Description Diagnosis E11.621 Type 2 diabetes mellitus with foot ulcer I70.245 Atherosclerosis of native arteries of left leg with ulceration of other part of  foot L97.522 Non-pressure chronic ulcer of other part of left foot with fat layer exposed CPT4: [id number] - WC PHYS SUBQ TISS 20 SQ CM 1 ICD-10 Description Diagnosis E11.621 Type 2 diabetes mellitus with foot ulcer I70.245 Atherosclerosis of native arteries of left leg with ulceration of other part of foot L97.522 Non-pressure chronic  ulcer of other part of left foot with fat layer exposed ROBBIN, STYLES ([id number]) Electronic Signature(s) Signed: 02/15/2016 4:02:53 PM By: Christin Fudge MD, FACS Entered By: Christin Fudge on 02/15/2016 16:02:52

## 2016-02-16 NOTE — Progress Notes (Signed)
TIERNAN, SAADE ([id number]) Visit Report for 02/15/2016 Arrival Information Details Patient Name: [name] Small, [name] Small. Date of Service: 02/15/2016 2:15 PM Medical Record Number: [id number] Patient Account Number: [id number] Date of Birth/Sex: 09/27/1928 (82 y.o. Male) Treating RN: Montey Hora Primary Care Physician: [name] Other Clinician: Referring Physician: [name] Treating Physician/Extender: Frann Rider in Treatment: 1 Visit Information History Since Last Visit Added or deleted any medications: No Patient Arrived: [name] Any new allergies or adverse reactions: No Arrival Time: 14:15 Had a fall or experienced change in No Accompanied By: spouse activities of daily living that may affect Transfer Assistance: None risk of falls: Patient Identification Verified: Yes Signs or symptoms of abuse/neglect since last No Secondary Verification Process Completed: Yes visito Patient Has Alerts: Yes Hospitalized since last visit: No Patient Alerts: DMII Pain Present Now: No Electronic Signature(s) Signed: 02/15/2016 5:50:07 PM By: Montey Hora Entered By: Montey Hora on 02/15/2016 14:18:12 [name] Small ([id number]) -------------------------------------------------------------------------------- Encounter Discharge Information Details Patient Name: [name] Small Date of Service: 02/15/2016 2:15 PM Medical Record Number: [id number] Patient Account Number: [id number] Date of Birth/Sex: 10-Jan-1928 (82 y.o. Male) Treating RN: Montey Hora Primary Care Physician: [name] Other Clinician: Referring Physician: [name] Treating Physician/Extender: Frann Rider in Treatment: 1 Encounter Discharge Information Items Discharge Pain Level: 0 Discharge Condition: Stable Ambulatory Status: Cane Discharge Destination: Home Transportation: Private Auto Accompanied By: spouse Schedule Follow-up Appointment: Yes Medication Reconciliation  completed No and provided to Patient/Care Brita Jurgensen: Provided on Clinical Summary of Care: 02/15/2016 Form Type Recipient Paper Patient RB Electronic Signature(s) Signed: 02/15/2016 2:48:58 PM By: Ruthine Dose Entered By: Ruthine Dose on 02/15/2016 14:48:58 [name] Small ([id number]) -------------------------------------------------------------------------------- Lower Extremity Assessment Details Patient Name: [name] Small Date of Service: 02/15/2016 2:15 PM Medical Record Number: [id number] Patient Account Number: [id number] Date of Birth/Sex: July 25, 1928 (82 y.o. Male) Treating RN: Montey Hora Primary Care Physician: [name] Other Clinician: Referring Physician: [name] Treating Physician/Extender: Frann Rider in Treatment: 1 Edema Assessment Assessed: [Left: No] [Right: No] Edema: [Left: N] [Right: o] Vascular Assessment Pulses: Posterior Tibial Dorsalis Pedis Palpable: [Left:Yes] Extremity colors, hair growth, and conditions: Extremity Color: [Left:Normal] Hair Growth on Extremity: [Left:No] Temperature of Extremity: [Left:Warm] Capillary Refill: [Left:< 3 seconds] Electronic Signature(s) Signed: 02/15/2016 5:50:07 PM By: Montey Hora Entered By: Montey Hora on 02/15/2016 14:26:56 [name] Small ([id number]) -------------------------------------------------------------------------------- Multi Wound Chart Details Patient Name: [name] Small Date of Service: 02/15/2016 2:15 PM Medical Record Number: [id number] Patient Account Number: [id number] Date of Birth/Sex: 1928/05/30 (82 y.o. Male) Treating RN: Montey Hora Primary Care Physician: [name] Other Clinician: Referring Physician: [name] Treating Physician/Extender: Frann Rider in Treatment: 1 Vital Signs Height(in): 74 Pulse(bpm): 67 Weight(lbs): 217 Blood Pressure 99/68 (mmHg): Body Mass Index(BMI): 28 Temperature(F): 98.0 Respiratory  Rate 18 (breaths/min): Photos: [1:No Photos] [N/A:N/A] Wound Location: [1:Left Metatarsal head first - N/A Medial] Wounding Event: [1:Gradually Appeared] [N/A:N/A] Primary Etiology: [1:Diabetic Wound/Ulcer of N/A the Lower Extremity] Comorbid History: [1:Chronic Obstructive Pulmonary Disease (COPD), Coronary Artery Disease, Hypertension, Peripheral Arterial Disease, Type II Diabetes, Neuropathy] [N/A:N/A] Date Acquired: [1:01/15/2016] [N/A:N/A] Weeks of Treatment: [1:1] [N/A:N/A] Wound Status: [1:Open] [N/A:N/A] Pending Amputation on Yes [N/A:N/A] Presentation: Measurements L x W x D 1.8x1.3x0.1 [N/A:N/A] (cm) Area (cm) : [1:1.838] [N/A:N/A] Volume (cm) : [1:0.184] [N/A:N/A] % Reduction in Area: [1:-56.00%] [N/A:N/A] % Reduction in Volume: 22.00% [N/A:N/A] Classification: [1:Grade 2] [N/A:N/A] Exudate Amount: [1:Medium] [N/A:N/A] Exudate Type: [1:Sanguinous] [N/A:N/A] Exudate Color: [1:red] [N/A:N/A] Wound Margin: [1:Flat and Intact] [N/A:N/A]  Granulation Amount: [1:None Present (0%)] [N/A:N/A] Necrotic Amount: Large (67-100%) N/A N/A Necrotic Tissue: Eschar N/A N/A Exposed Structures: Fascia: No N/A N/A Fat: No Tendon: No Muscle: No Joint: No Bone: No Limited to Skin Breakdown Epithelialization: None N/A N/A Periwound Skin Texture: Callus: Yes N/A N/A Edema: No Excoriation: No Induration: No Crepitus: No Fluctuance: No Friable: No Rash: No Scarring: No Periwound Skin Dry/Scaly: Yes N/A N/A Moisture: Maceration: No Moist: No Periwound Skin Color: Atrophie Blanche: No N/A N/A Cyanosis: No Ecchymosis: No Erythema: No Hemosiderin Staining: No Mottled: No Pallor: No Rubor: No Temperature: No Abnormality N/A N/A Tenderness on No N/A N/A Palpation: Wound Preparation: Ulcer Cleansing: N/A N/A Rinsed/Irrigated with Saline Topical Anesthetic Applied: Other: lidocaine 4% Treatment Notes Electronic Signature(s) Signed: 02/15/2016 5:50:07 PM By: Montey Hora Entered By: Montey Hora on 02/15/2016 14:31:42 [name] Small ([id number]) -------------------------------------------------------------------------------- [place] Details Patient Name: [name] Small Date of Service: 02/15/2016 2:15 PM Medical Record Number: [id number] Patient Account Number: [id number] Date of Birth/Sex: [birth year] (82 y.o. Male) Treating RN: Montey Hora Primary Care Physician: [name] Other Clinician: Referring Physician: [name] Treating Physician/Extender: Frann Rider in Treatment: 1 Active Inactive Abuse / Safety / Falls / Self Care Management Nursing Diagnoses: Potential for falls Goals: Patient will remain injury free Date Initiated: 02/08/2016 Goal Status: Active Interventions: Assess fall risk on admission and as needed Notes: Orientation to the Wound Care Program Nursing Diagnoses: Knowledge deficit related to the wound healing center program Goals: Patient/caregiver will verbalize understanding of the [facility] Date Initiated: 02/08/2016 Goal Status: Active Interventions: Provide education on orientation to the wound center Notes: Wound/Skin Impairment Nursing Diagnoses: Impaired tissue integrity Goals: Patient/caregiver will verbalize understanding of skin care regimen Date Initiated: 02/08/2016 [name] Small ([id number]) Goal Status: Active Ulcer/skin breakdown will have a volume reduction of 30% by week 4 Date Initiated: 02/08/2016 Goal Status: Active Ulcer/skin breakdown will have a volume reduction of 50% by week 8 Date Initiated: 02/08/2016 Goal Status: Active Ulcer/skin breakdown will have a volume reduction of 80% by week 12 Date Initiated: 02/08/2016 Goal Status: Active Ulcer/skin breakdown will heal within 14 weeks Date Initiated: 02/08/2016 Goal Status: Active Interventions: Assess patient/caregiver ability to obtain necessary supplies Assess  patient/caregiver ability to perform ulcer/skin care regimen upon admission and as needed Assess ulceration(s) every visit Notes: Electronic Signature(s) Signed: 02/15/2016 5:50:07 PM By: Montey Hora Entered By: Montey Hora on 02/15/2016 14:27:05 [name] Small ([id number]) -------------------------------------------------------------------------------- Patient/Caregiver Education Details Patient Name: [name] Small Date of Service: 02/15/2016 2:15 PM Medical Record Number: [id number] Patient Account Number: [id number] Date of Birth/Gender: 30-Sep-1928 (82 y.o. Male) Treating RN: Montey Hora Primary Care Physician: [name] Other Clinician: Referring Physician: [name] Treating Physician/Extender: Frann Rider in Treatment: 1 Education Assessment Education Provided To: Patient and Caregiver Education Topics Provided Wound/Skin Impairment: Handouts: Other: wound care to continue as ordered Methods: Demonstration, Explain/Verbal Responses: State content correctly Electronic Signature(s) Signed: 02/15/2016 5:50:07 PM By: Montey Hora Entered By: Montey Hora on 02/15/2016 14:38:45 [name] Small ([id number]) -------------------------------------------------------------------------------- Wound Assessment Details Patient Name: [name] Small Date of Service: 02/15/2016 2:15 PM Medical Record Number: [id number] Patient Account Number: [id number] Date of Birth/Sex: October 23, 1928 (82 y.o. Male) Treating RN: Montey Hora Primary Care Physician: [name] Other Clinician: Referring Physician: [name] Treating Physician/Extender: Frann Rider in Treatment: 1 Wound Status Wound Number: 1 Primary Diabetic Wound/Ulcer of the Lower Etiology: Extremity Wound Location: Left Metatarsal head first - Medial Wound Open  Status: Wounding Event: Gradually Appeared Comorbid Chronic Obstructive Pulmonary Disease Date Acquired:  01/15/2016 History: (COPD), Coronary Artery Disease, Weeks Of Treatment: 1 Hypertension, Peripheral Arterial Clustered Wound: No Disease, Type II Diabetes, Neuropathy Pending Amputation On Presentation Photos Photo Uploaded By: Montey Hora on 02/15/2016 16:39:34 Wound Measurements Length: (cm) 1.8 Width: (cm) 1.3 Depth: (cm) 0.1 Area: (cm) 1.838 Volume: (cm) 0.184 % Reduction in Area: -56% % Reduction in Volume: 22% Epithelialization: None Tunneling: No Undermining: No Wound Description Classification: Grade 2 Foul Odor Aft Wound Margin: Flat and Intact Exudate Amount: Medium Exudate Type: Sanguinous Exudate Color: red er Cleansing: No Wound Bed Granulation Amount: None Present (0%) Exposed Structure Necrotic Amount: Large (67-100%) Fascia Exposed: No HILLIS, DENZER. ([id number]) Necrotic Quality: Eschar Fat Layer Exposed: No Tendon Exposed: No Muscle Exposed: No Joint Exposed: No Bone Exposed: No Limited to Skin Breakdown Periwound Skin Texture Texture Color No Abnormalities Noted: No No Abnormalities Noted: No Callus: Yes Atrophie Blanche: No Crepitus: No Cyanosis: No Excoriation: No Ecchymosis: No Fluctuance: No Erythema: No Friable: No Hemosiderin Staining: No Induration: No Mottled: No Localized Edema: No Pallor: No Rash: No Rubor: No Scarring: No Temperature / Pain Moisture Temperature: No Abnormality No Abnormalities Noted: No Dry / Scaly: Yes Maceration: No Moist: No Wound Preparation Ulcer Cleansing: Rinsed/Irrigated with Saline Topical Anesthetic Applied: Other: lidocaine 4%, Treatment Notes Wound #1 (Left, Medial Metatarsal head first) 1. Cleansed with: Clean wound with Normal Saline 2. Anesthetic Topical Lidocaine 4% cream to wound bed prior to debridement 4. Dressing Applied: Santyl Ointment 5. Secondary Dressing Applied Gauze and Kerlix/Conform 7. Secured with Recruitment consultant) Signed: 02/15/2016 5:50:07 PM  By: Montey Hora Entered By: Montey Hora on 02/15/2016 14:25:10 [name] Small ([id number] Fillers, Antonieta Loveless ([id number]) -------------------------------------------------------------------------------- Vitals Details Patient Name: [name] Small Date of Service: 02/15/2016 2:15 PM Medical Record Number: [id number] Patient Account Number: [id number] Date of Birth/Sex: August 21, 1928 (82 y.o. Male) Treating RN: Montey Hora Primary Care Physician: [name] Other Clinician: Referring Physician: [name] Treating Physician/Extender: Frann Rider in Treatment: 1 Vital Signs Time Taken: 14:21 Temperature (F): 98.0 Height (in): 74 Pulse (bpm): 67 Weight (lbs): 217 Respiratory Rate (breaths/min): 18 Body Mass Index (BMI): 27.9 Blood Pressure (mmHg): 99/68 Reference Range: 80 - 120 mg / dl Electronic Signature(s) Signed: 02/15/2016 5:50:07 PM By: Montey Hora Entered By: Montey Hora on 02/15/2016 14:22:01

## 2016-02-19 ENCOUNTER — Ambulatory Visit (INDEPENDENT_AMBULATORY_CARE_PROVIDER_SITE_OTHER): Payer: Medicare Other

## 2016-02-19 ENCOUNTER — Ambulatory Visit (INDEPENDENT_AMBULATORY_CARE_PROVIDER_SITE_OTHER): Payer: Medicare Other | Admitting: Family Medicine

## 2016-02-19 ENCOUNTER — Encounter: Payer: Self-pay | Admitting: Family Medicine

## 2016-02-19 VITALS — BP 110/64 | HR 68 | Temp 97.6°F | Ht 74.0 in | Wt 216.0 lb

## 2016-02-19 DIAGNOSIS — E1140 Type 2 diabetes mellitus with diabetic neuropathy, unspecified: Secondary | ICD-10-CM | POA: Diagnosis not present

## 2016-02-19 DIAGNOSIS — Z23 Encounter for immunization: Secondary | ICD-10-CM

## 2016-02-19 DIAGNOSIS — Z0000 Encounter for general adult medical examination without abnormal findings: Secondary | ICD-10-CM | POA: Diagnosis not present

## 2016-02-19 DIAGNOSIS — I739 Peripheral vascular disease, unspecified: Secondary | ICD-10-CM | POA: Diagnosis not present

## 2016-02-19 DIAGNOSIS — Z794 Long term (current) use of insulin: Secondary | ICD-10-CM

## 2016-02-19 LAB — HEMOGLOBIN A1C: Hgb A1c MFr Bld: 9.2 % — ABNORMAL HIGH (ref 4.6–6.5)

## 2016-02-19 NOTE — Patient Instructions (Addendum)
[name] Small , Thank you for taking time to come for your Medicare Wellness Visit. I appreciate your ongoing commitment to your health goals. Please review the following plan we discussed and let me know if I can assist you in the future.   These are the goals we discussed: Goals    None      This is a list of the screening recommended for you and due dates:  Health Maintenance  Topic Date Due  . Shingles Vaccine  02/18/2017*  . Hemoglobin A1C  04/29/2016  . Eye exam for diabetics  06/07/2016  . Flu Shot  06/11/2016  . Complete foot exam   11/08/2016  . Tetanus Vaccine  04/07/2018  . Pneumonia vaccines  Completed  *Topic was postponed. The date shown is not the original due date.    Preventive Care for Adults  A healthy lifestyle and preventive care can promote health and wellness. Preventive health guidelines for adults include the following key practices.  . A routine yearly physical is a good way to check with your health care provider about your health and preventive screening. It is a chance to share any concerns and updates on your health and to receive a thorough exam.  . Visit your dentist for a routine exam and preventive care every 6 months. Brush your teeth twice a day and floss once a day. Good oral hygiene prevents tooth decay and gum disease.  . The frequency of eye exams is based on your age, health, family medical history, use  of contact lenses, and other factors. Follow your health care provider's ecommendations for frequency of eye exams.  . Eat a healthy diet. Foods like vegetables, fruits, whole grains, low-fat dairy products, and lean protein foods contain the nutrients you need without too many calories. Decrease your intake of foods high in solid fats, added sugars, and salt. Eat the right amount of calories for you. Get information about a proper diet from your health care provider, if necessary.  . Regular physical exercise is one of the most important  things you can do for your health. Most adults should get at least 150 minutes of moderate-intensity exercise (any activity that increases your heart rate and causes you to sweat) each week. In addition, most adults need muscle-strengthening exercises on 2 or more days a week.  Silver Sneakers may be a benefit available to you. To determine eligibility, you may visit the website: www.silversneakers.com or contact program at 6265916840 Mon-Fri between 8AM-8PM.   . Maintain a healthy weight. The body mass index (BMI) is a screening tool to identify possible weight problems. It provides an estimate of body fat based on height and weight. Your health care provider can find your BMI and can help you achieve or maintain a healthy weight.   For adults 20 years and older: ? A BMI below 18.5 is considered underweight. ? A BMI of 18.5 to 24.9 is normal. ? A BMI of 25 to 29.9 is considered overweight. ? A BMI of 30 and above is considered obese.   . Maintain normal blood lipids and cholesterol levels by exercising and minimizing your intake of saturated fat. Eat a balanced diet with plenty of fruit and vegetables. Blood tests for lipids and cholesterol should begin at age 38 and be repeated every 5 years. If your lipid or cholesterol levels are high, you are over 50, or you are at high risk for heart disease, you may need your cholesterol levels checked more frequently.  Ongoing high lipid and cholesterol levels should be treated with medicines if diet and exercise are not working.  . If you smoke, find out from your health care provider how to quit. If you do not use tobacco, please do not start.  . If you choose to drink alcohol, please do not consume more than 2 drinks per day. One drink is considered to be 12 ounces (355 mL) of beer, 5 ounces (148 mL) of wine, or 1.5 ounces (44 mL) of liquor.  . If you are 44-11 years old, ask your health care provider if you should take aspirin to prevent  strokes.  . Use sunscreen. Apply sunscreen liberally and repeatedly throughout the day. You should seek shade when your shadow is shorter than you. Protect yourself by wearing long sleeves, pants, a wide-brimmed hat, and sunglasses year round, whenever you are outdoors.  . Once a month, do a whole body skin exam, using a mirror to look at the skin on your back. Tell your health care provider of new moles, moles that have irregular borders, moles that are larger than a pencil eraser, or moles that have changed in shape or color.

## 2016-02-19 NOTE — Patient Instructions (Signed)
Go to the lab on the way out.  We'll contact you with your lab report. We'll make plans after I see your A1c and the notes from the other docs.  Take care.  Glad to see you.

## 2016-02-19 NOTE — Progress Notes (Signed)
Foot ulcer.  With PAD.  No pain.  Lesion is dressed.  Has had f/u with cards re: vascular studies and would care clinic.    Diabetes:  Using medications without difficulties: yes Hypoglycemic episodes: no, none lower then 80s.  Hyperglycemic episodes:no Feet problems: see above and below Blood Sugars averaging: ~130s  eye exam within last year:yes Due for A1c.   Meds, vitals, and allergies reviewed.   ROS: See HPI.  Otherwise negative.    GEN: nad, alert and oriented HEENT: mucous membranes moist NECK: supple w/o LA CV: sounds to be rrr. PULM: ctab, no inc wob ABD: soft, +bs EXT: no edema L foot wrapped.  I didn't take the bandage down.

## 2016-02-19 NOTE — Progress Notes (Signed)
Subjective:   [name] Small is a 82 y.o. male who presents for Medicare Annual/Subsequent preventive examination.  Cardiac Risk Factors include: advanced age (>40men, >51 women);diabetes mellitus;hypertension;male gender;dyslipidemia     Objective:    Vitals: BP 110/64 mmHg  Pulse 68  Temp(Src) 97.6 F (36.4 C) (Oral)  Ht 6\' 2"  (1.88 m)  Wt 216 lb (97.977 kg)  BMI 27.72 kg/m2  SpO2 92%  Body mass index is 27.72 kg/(m^2).  Tobacco History  Smoking status  . Former Smoker -- 2.00 packs/day for 22 years  . Types: Cigarettes  . Quit date: 11/11/1968  Smokeless tobacco  . Never Used     Counseling given: No   Past Medical History  Diagnosis Date  . BENIGN PROSTATIC HYPERTROPHY 05/21/2007  . DIABETES MELLITUS, TYPE II 05/21/2007  . DIVERTICULOSIS, COLON 05/21/2007    pt denies  . HYPERTENSION 05/21/2007  . PSORIASIS, SCALP 10/25/2008  . Cellulitis of left foot     Hospitalized in 2006  . Gross hematuria     pt denies  . Psoriasis ELBOWS  . Fatigue AGE-RELATED  . Arthritis   . CAD (coronary artery disease) CARDIOLOGIST- DR WALL - VISIT IN JUNE 2012 FOR CATH    nonobstructive by cath 6/12:  mid LAD 30%, proximal obtuse marginal-2 30%, proximal RCA 20%, mid RCA 30-40%.  He had normal cardiac output and mildly elevated filling pressures but no significant pulmonary hypertension;   Echocardiogram in May 2012 demonstrated EF 50-55% and left atrial enlargement   . Impaired hearing BILATERAL HEARING AIDS  . Bladder cancer ([place])     2013  . Chronic kidney disease (CKD), stage III (moderate) 12/04/2007    FOLLOWED BY DR PATEL  . Back pain     s/p lumbar injection 2014   Past Surgical History  Procedure Laterality Date  . Vasectomy  15 YRS AGO  W/ ANES.  Marland Kitchen Penile prosthesis implant  15 YRS AGO  . Cardiovascular stress test  2007  . Cardiac catheterization  04-24-11/  DR MUHAMMAD ARIDA    MILD NONOBSTRUCTIVE CAD, NORMA CARDIAC OUTPUT  . Appendectomy  AGE 75  .  Tonsillectomy and adenoidectomy  AGE 63 YRS AGO  . Cataract extraction w/ intraocular lens  implant, bilateral    . Incision and drainage foot  LEFT FOOT DUE TO INFECTION FROM  NAIL PUNCTURE INJURY  . Cystoscopy w/ retrogrades  10/30/2011    Procedure: CYSTOSCOPY WITH RETROGRADE PYELOGRAM;  Surgeon: Molli Hazard, MD;  Location: [facility];  Service: Urology;  Laterality: Bilateral;  CYSTOSCOPY POSS TURBT BILATERAL RETROGRADE PYLEOGRAM POSS BILATERAL URETEROSCOPY WITH BIOPSY LASER ABLATION OF LESION POSS BILATERAL URETERAL STENT PLACEMENT   CARM FLEXIBLE URETERAL SCOPE DIGITAL URETERAL SCOPE PK GYRUS HO  . Transurethral resection of bladder tumor  10/30/2011    Procedure: TRANSURETHRAL RESECTION OF BLADDER TUMOR (TURBT);  Surgeon: Molli Hazard, MD;  Location[facility];  Service: Urology;  Laterality: N/A;  . Cystoscopy  12/25/2011    Procedure: CYSTOSCOPY;  Surgeon: Molli Hazard, MD;  Location: [facility];  Service: Urology;  Laterality: N/A;  needs intubation and to be paralyzed   . Transurethral resection of bladder tumor  12/25/2011    Procedure: TRANSURETHRAL RESECTION OF BLADDER TUMOR (TURBT);  Surgeon: Molli Hazard, MD;  Location: [facility];  Service: Urology;  Laterality: N/A;  need long gyrus instruments Dr states he has spoken w/ Nelwyn Salisbury regarding this    .  Cystoscopy w/ retrogrades  11/30/2012    Procedure: CYSTOSCOPY WITH RETROGRADE PYELOGRAM;  Surgeon: Molli Hazard, MD;  Location[facility];  Service: Urology;  Laterality: Bilateral;  Flexible cystoscopy.  . Cystoscopy with biopsy  11/30/2012    Procedure: CYSTOSCOPY WITH BIOPSY;  Surgeon: Molli Hazard, MD;  Location: [facility];  Service: Urology;  Laterality: N/A;  . Peripheral vascular catheterization N/A 02/14/2016    Procedure: Abdominal Aortogram w/Lower Extremity;  Surgeon:  Wellington Hampshire, MD;  Location: [facility];  Service: Cardiovascular;  Laterality: N/A;   Family History  Problem Relation Age of Onset  . Diabetes Mother   . Heart disease Brother   . Heart disease Brother   . Heart disease Brother   . Heart disease Brother   . Heart disease Brother   . Heart disease Brother   . Lung cancer Brother    History  Sexual Activity  . Sexual Activity: No    Outpatient Encounter Prescriptions as of 02/19/2016  Medication Sig  . acetaminophen (TYLENOL) 500 MG tablet Take 1,000 mg by mouth every 8 (eight) hours as needed for mild pain, moderate pain or headache.  Marland Kitchen amLODipine (NORVASC) 5 MG tablet Take 1 tablet (5 mg total) by mouth daily.  Marland Kitchen atorvastatin (LIPITOR) 10 MG tablet Take 1 tablet (10 mg total) by mouth daily.  . budesonide-formoterol (SYMBICORT) 160-4.5 MCG/ACT inhaler Inhale 2 puffs into the lungs 2 (two) times daily as needed.  . clobetasol (TEMOVATE) 0.05 % external solution Apply 1 application topically daily.  . clopidogrel (PLAVIX) 75 MG tablet Take 1 tablet by mouth  daily  . collagenase (SANTYL) ointment Apply 1 application topically daily.  . furosemide (LASIX) 40 MG tablet TAKE 1 TABLET BY MOUTH  EVERY MONDAY, WEDNESDAY,  AND FRIDAY  . gabapentin (NEURONTIN) 100 MG capsule Take 1 capsule (100 mg total) by mouth 3 (three) times daily.  Marland Kitchen glipiZIDE (GLUCOTROL XL) 5 MG 24 hr tablet Take 1 tablet by mouth  daily with breakfast  . insulin glargine (LANTUS) 100 UNIT/ML injection Inject 25 Units into the skin daily at 3 pm.  . [DISCONTINUED] cephALEXin (KEFLEX) 500 MG capsule Take 1 capsule (500 mg total) by mouth 3 (three) times daily.   No facility-administered encounter medications on file as of 02/19/2016.    Activities of Daily Living In your present state of health, do you have any difficulty performing the following activities: 02/19/2016 02/14/2016  Hearing? Tempie Donning  Vision? N N  Difficulty concentrating or making decisions? N N   Walking or climbing stairs? N N  Dressing or bathing? N N  Doing errands, shopping? N -  Preparing Food and eating ? N -  Using the Toilet? N -  In the past six months, have you accidently leaked urine? N -  Do you have problems with loss of bowel control? N -  Managing your Medications? N -  Managing your Finances? N -  Housekeeping or managing your Housekeeping? N -    Patient Care Team: Tonia Ghent, MD as PCP - General (Family Medicine)   Dr. Katy Fitch - optometry  Assessment:    Hearing Screening Comments: Wears bilateral hearing aids Vision Screening Comments: Last eye exam in 07/2015 with Dr. Katy Fitch  Exercise Activities and Dietary recommendations Current Exercise Habits: The patient does not participate in regular exercise at present, Exercise limited by: None identified  Goals    None     Fall Risk Fall  Risk  02/19/2016  Falls in the past year? No   Depression Screen PHQ 2/9 Scores 02/19/2016 02/19/2016  PHQ - 2 Score 0 0    Cognitive Testing MMSE - Mini Mental State Exam 02/19/2016  Orientation to time 5  Orientation to Place 5  Registration 3  Attention/ Calculation 0  Recall 3  Language- name 2 objects 0  Language- repeat 1  Language- follow 3 step command 3  Language- read & follow direction 0  Write a sentence 0  Copy design 0  Total score 20   PLEASE NOTE: A Mini-Cog screen was completed. Maximum score is 20. A value of 0 denotes this part of [place] MMSE was not completed.  Orientation to Time - Max 5 Orientation to Place - Max 5 Registration - Max 3 Recall - Max 3 Language Repeat - Max 1 Language Follow 3 Step Command - Max 3   Immunization History  Administered Date(s) Administered  . Influenza Split 08/27/2011, 09/15/2012  . Influenza Whole 08/21/2007, 08/08/2008, 08/30/2009, 07/24/2010  . Influenza,inj,Quad PF,36+ Mos 07/27/2013, 07/19/2014, 07/18/2015  . Pneumococcal Conjugate-13 01/10/2015  . Pneumococcal Polysaccharide-23  02/19/2016  . Td 04/07/2008   Screening Tests Health Maintenance  Topic Date Due  . ZOSTAVAX  02/18/2017 (Originally 11/[birth year]/1990)  . HEMOGLOBIN A1C  04/29/2016  . OPHTHALMOLOGY EXAM  06/07/2016  . INFLUENZA VACCINE  06/11/2016  . FOOT EXAM  11/08/2016  . TETANUS/TDAP  04/07/2018  . PNA vac Low Risk Adult  Completed      Plan:     I have personally reviewed and addressed the Medicare Annual Wellness questionnaire and have noted the following in the patient's chart:  A. Medical and social history B. Use of alcohol, tobacco or illicit drugs  C. Current medications and supplements D. Functional ability and status E.  Nutritional status F.  Physical activity G. Advance directives H. List of other physicians I.  Hospitalizations, surgeries, and ER visits in previous 12 months J.  [place] to include hearing, vision, cognitive, depression L. Referrals and appointments - none  In addition, I have reviewed and discussed with patient certain preventive protocols, quality metrics, and best practice recommendations. A written personalized care plan for preventive services as well as general preventive health recommendations were provided to patient.  See attached scanned questionnaire for additional information.   Signed,   Lindell Noe, MHA, BS, LPN Health Advisor [phone]  I reviewed health advisor's note, was available for consultation on the day of service listed in this note, and agree with documentation and plan. [name], MD.

## 2016-02-19 NOTE — Progress Notes (Signed)
Pre visit review using our clinic review tool, if applicable. No additional management support is needed unless otherwise documented below in the visit note. 

## 2016-02-21 NOTE — Assessment & Plan Note (Signed)
App help of all involved.   

## 2016-02-21 NOTE — Assessment & Plan Note (Signed)
D/w pt about diet; exercise limited for multiple reasons, esp his foot.   No change in meds at this point.  Recheck A1c, see notes on labs.   He agrees.  >25 minutes spent in face to face time with patient, >50% spent in counselling or coordination of care.

## 2016-02-22 ENCOUNTER — Encounter: Payer: Medicare Other | Admitting: Surgery

## 2016-02-22 DIAGNOSIS — Z992 Dependence on renal dialysis: Secondary | ICD-10-CM | POA: Diagnosis not present

## 2016-02-22 DIAGNOSIS — E11621 Type 2 diabetes mellitus with foot ulcer: Secondary | ICD-10-CM | POA: Diagnosis not present

## 2016-02-22 DIAGNOSIS — J449 Chronic obstructive pulmonary disease, unspecified: Secondary | ICD-10-CM | POA: Diagnosis not present

## 2016-02-22 DIAGNOSIS — I739 Peripheral vascular disease, unspecified: Secondary | ICD-10-CM | POA: Diagnosis not present

## 2016-02-22 DIAGNOSIS — I2510 Atherosclerotic heart disease of native coronary artery without angina pectoris: Secondary | ICD-10-CM | POA: Diagnosis not present

## 2016-02-22 DIAGNOSIS — N183 Chronic kidney disease, stage 3 (moderate): Secondary | ICD-10-CM | POA: Diagnosis not present

## 2016-02-22 DIAGNOSIS — I509 Heart failure, unspecified: Secondary | ICD-10-CM | POA: Diagnosis not present

## 2016-02-22 DIAGNOSIS — E1140 Type 2 diabetes mellitus with diabetic neuropathy, unspecified: Secondary | ICD-10-CM | POA: Diagnosis not present

## 2016-02-22 DIAGNOSIS — Z87891 Personal history of nicotine dependence: Secondary | ICD-10-CM | POA: Diagnosis not present

## 2016-02-22 DIAGNOSIS — I70245 Atherosclerosis of native arteries of left leg with ulceration of other part of foot: Secondary | ICD-10-CM | POA: Diagnosis not present

## 2016-02-22 DIAGNOSIS — L97521 Non-pressure chronic ulcer of other part of left foot limited to breakdown of skin: Secondary | ICD-10-CM | POA: Diagnosis not present

## 2016-02-22 DIAGNOSIS — L97522 Non-pressure chronic ulcer of other part of left foot with fat layer exposed: Secondary | ICD-10-CM | POA: Diagnosis not present

## 2016-02-22 DIAGNOSIS — Z794 Long term (current) use of insulin: Secondary | ICD-10-CM | POA: Diagnosis not present

## 2016-02-22 DIAGNOSIS — Z8551 Personal history of malignant neoplasm of bladder: Secondary | ICD-10-CM | POA: Diagnosis not present

## 2016-02-22 DIAGNOSIS — I129 Hypertensive chronic kidney disease with stage 1 through stage 4 chronic kidney disease, or unspecified chronic kidney disease: Secondary | ICD-10-CM | POA: Diagnosis not present

## 2016-02-23 ENCOUNTER — Encounter: Payer: Self-pay | Admitting: Cardiovascular Disease

## 2016-02-23 ENCOUNTER — Other Ambulatory Visit: Payer: Self-pay | Admitting: *Deleted

## 2016-02-23 ENCOUNTER — Ambulatory Visit (INDEPENDENT_AMBULATORY_CARE_PROVIDER_SITE_OTHER): Payer: Medicare Other | Admitting: Cardiovascular Disease

## 2016-02-23 VITALS — BP 104/60 | HR 67 | Ht 74.0 in | Wt 215.0 lb

## 2016-02-23 DIAGNOSIS — Z01812 Encounter for preprocedural laboratory examination: Secondary | ICD-10-CM

## 2016-02-23 DIAGNOSIS — E785 Hyperlipidemia, unspecified: Secondary | ICD-10-CM

## 2016-02-23 DIAGNOSIS — I4891 Unspecified atrial fibrillation: Secondary | ICD-10-CM

## 2016-02-23 MED ORDER — ATORVASTATIN CALCIUM 10 MG PO TABS: 10.0000 mg | ORAL_TABLET | Freq: Every day | ORAL | Status: DC

## 2016-02-23 MED ORDER — GABAPENTIN 100 MG PO CAPS: 100.0000 mg | ORAL_CAPSULE | Freq: Three times a day (TID) | ORAL | Status: DC

## 2016-02-23 NOTE — Patient Instructions (Addendum)
Medication Instructions:  Your physician recommends that you continue on your current medications as directed. Please refer to the Current Medication list given to you today.   Labwork: BMET, CBC, PT/INR today  Testing/Procedures: Left popliteal angioplasty Wednesday, April 19 Arrive at 6:30am for hydration, procedure at 11:30  [facility] [place] [phone]  Proceed to Entrance A, Short Stay  Nothing to eat or drink after midnight the evening before your procedure. You may take you morning medication with a sip of water Do not take lasix the morning of your procedure.    Follow-Up: Your physician recommends that you schedule a follow-up appointment in: two weeks with Dr. Fletcher Anon.    Any Other Special Instructions Will Be Listed Below (If Applicable).     If you need a refill on your cardiac medications before your next appointment, please call your pharmacy.

## 2016-02-23 NOTE — Progress Notes (Signed)
[name] Small, [name] Small ([id number]) Visit Report for 02/22/2016 Arrival Information Details Patient Name: [name] Small. Date of Service: 02/22/2016 2:15 PM Medical Record Number: [id number] Patient Account Number: [id number] Date of Birth/Sex: 07-Nov-1928 (82 y.o. Male) Treating RN: Montey Hora Primary Care Physician: [name] Other Clinician: Referring Physician: [name] Treating Physician/Extender: Frann Rider in Treatment: 2 Visit Information History Since Last Visit Added or deleted any medications: No Patient Arrived: [name] Small Any new allergies or adverse reactions: No Arrival Time: 14:30 Had a fall or experienced change in No Accompanied By: spouse activities of daily living that may affect Transfer Assistance: None risk of falls: Patient Identification Verified: Yes Signs or symptoms of abuse/neglect since last No Secondary Verification Process Completed: Yes visito Patient Has Alerts: Yes Hospitalized since last visit: No Patient Alerts: DMII Pain Present Now: No Electronic Signature(s) Signed: 02/22/2016 4:03:09 PM By: Montey Hora Entered By: Montey Hora on 02/22/2016 14:31:48 [name] Small ([id number]) -------------------------------------------------------------------------------- Clinic Level of Care Assessment Details Patient Name: [name] Small Date of Service: 02/22/2016 2:15 PM Medical Record Number: [id number] Patient Account Number: [id number] Date of Birth/Sex: 1927-11-19 (82 y.o. Male) Treating RN: Montey Hora Primary Care Physician: [name] Other Clinician: Referring Physician: [name] Treating Physician/Extender: Frann Rider in Treatment: 2 Clinic Level of Care Assessment Items TOOL 4 Quantity Score []  - Use when only an EandM is performed on FOLLOW-UP visit 0 ASSESSMENTS - Nursing Assessment / Reassessment X - Reassessment of Co-morbidities (includes updates in patient status) 1 10 X -  Reassessment of Adherence to Treatment Plan 1 5 ASSESSMENTS - Wound and Skin Assessment / Reassessment X - Simple Wound Assessment / Reassessment - one wound 1 5 []  - Complex Wound Assessment / Reassessment - multiple wounds 0 []  - Dermatologic / Skin Assessment (not related to wound area) 0 ASSESSMENTS - Focused Assessment []  - Circumferential Edema Measurements - multi extremities 0 []  - Nutritional Assessment / Counseling / Intervention 0 X - Lower Extremity Assessment (monofilament, tuning fork, pulses) 1 5 []  - Peripheral Arterial Disease Assessment (using hand held doppler) 0 ASSESSMENTS - Ostomy and/or Continence Assessment and Care []  - Incontinence Assessment and Management 0 []  - Ostomy Care Assessment and Management (repouching, etc.) 0 PROCESS - Coordination of Care X - Simple Patient / Family Education for ongoing care 1 15 []  - Complex (extensive) Patient / Family Education for ongoing care 0 []  - Staff obtains Programmer, systems, Records, Test Results / Process Orders 0 []  - Staff telephones HHA, Nursing Homes / Clarify orders / etc 0 []  - Routine Transfer to another Facility (non-emergent condition) 0 [name] Small, [name] Small ([id number]) []  - Routine Hospital Admission (non-emergent condition) 0 []  - New Admissions / Biomedical engineer / Ordering NPWT, Apligraf, etc. 0 []  - Emergency Hospital Admission (emergent condition) 0 X - Simple Discharge Coordination 1 10 []  - Complex (extensive) Discharge Coordination 0 PROCESS - Special Needs []  - Pediatric / Minor Patient Management 0 []  - Isolation Patient Management 0 []  - Hearing / Language / Visual special needs 0 []  - Assessment of Community assistance (transportation, D/C planning, etc.) 0 []  - Additional assistance / Altered mentation 0 []  - Support Surface(s) Assessment (bed, cushion, seat, etc.) 0 INTERVENTIONS - Wound Cleansing / Measurement X - Simple Wound Cleansing - one wound 1 5 []  - Complex Wound Cleansing - multiple  wounds 0 X - Wound Imaging (photographs - any number of wounds) 1 5 []  - Wound Tracing (instead of photographs) 0 X - Simple  Wound Measurement - one wound 1 5 []  - Complex Wound Measurement - multiple wounds 0 INTERVENTIONS - Wound Dressings X - Small Wound Dressing one or multiple wounds 1 10 []  - Medium Wound Dressing one or multiple wounds 0 []  - Large Wound Dressing one or multiple wounds 0 X - Application of Medications - topical 1 5 []  - Application of Medications - injection 0 INTERVENTIONS - Miscellaneous []  - External ear exam 0 [name] Small, [name] Small ([id number]) []  - Specimen Collection (cultures, biopsies, blood, body fluids, etc.) 0 []  - Specimen(s) / Culture(s) sent or taken to Lab for analysis 0 []  - Patient Transfer (multiple staff / Harrel Lemon Lift / Similar devices) 0 []  - Simple Staple / Suture removal (25 or less) 0 []  - Complex Staple / Suture removal (26 or more) 0 []  - Hypo / Hyperglycemic Management (close monitor of Blood Glucose) 0 []  - Ankle / Brachial Index (ABI) - do not check if billed separately 0 X - Vital Signs 1 5 Has the patient been seen at the hospital within the last three years: Yes Total Score: 85 Level Of Care: New/Established - Level 3 Electronic Signature(s) Signed: 02/22/2016 4:03:09 PM By: Montey Hora Entered By: Montey Hora on 02/22/2016 14:53:39 [name] Small ([id number]) -------------------------------------------------------------------------------- Encounter Discharge Information Details Patient Name: [name] Small Date of Service: 02/22/2016 2:15 PM Medical Record Number: [id number] Patient Account Number: [id number] Date of Birth/Sex: Oct 22, 1928 (82 y.o. Male) Treating RN: Montey Hora Primary Care Physician: [name] Other Clinician: Referring Physician: [name] Treating Physician/Extender: Frann Rider in Treatment: 2 Encounter Discharge Information Items Discharge Pain Level: 0 Discharge Condition:  Stable Ambulatory Status: Cane Discharge Destination: Home Transportation: Private Auto Accompanied By: spouse Schedule Follow-up Appointment: Yes Medication Reconciliation completed No and provided to Patient/Care [name] Small: Provided on Clinical Summary of Care: 02/22/2016 Form Type Recipient Paper Patient RB Electronic Signature(s) Signed: 02/22/2016 3:25:12 PM By: Montey Hora Previous Signature: 02/22/2016 3:02:47 PM Version By: Ruthine Dose Entered By: Montey Hora on 02/22/2016 15:25:11 [name] Small ([id number]) -------------------------------------------------------------------------------- Lower Extremity Assessment Details Patient Name: [name] Small Date of Service: 02/22/2016 2:15 PM Medical Record Number: [id number] Patient Account Number: [id number] Date of Birth/Sex: Mar 25, 1928 (82 y.o. Male) Treating RN: Montey Hora Primary Care Physician: [name] Other Clinician: Referring Physician: [name] Treating Physician/Extender: Frann Rider in Treatment: 2 Vascular Assessment Pulses: Posterior Tibial Dorsalis Pedis Palpable: [Left:Yes] Extremity colors, hair growth, and conditions: Extremity Color: [Left:Normal] Hair Growth on Extremity: [Left:No] Temperature of Extremity: [Left:Warm] Capillary Refill: [Left:< 3 seconds] Electronic Signature(s) Signed: 02/22/2016 4:03:09 PM By: Montey Hora Entered By: Montey Hora on 02/22/2016 14:51:25 [name] Small ([id number]) -------------------------------------------------------------------------------- Multi Wound Chart Details Patient Name: [name] Small Date of Service: 02/22/2016 2:15 PM Medical Record Number: [id number] Patient Account Number: [id number] Date of Birth/Sex: Sep 22, 1928 (82 y.o. Male) Treating RN: Montey Hora Primary Care Physician: [name] Other Clinician: Referring Physician: [name] Treating Physician/Extender: Frann Rider in  Treatment: 2 Vital Signs Height(in): 74 Pulse(bpm): 70 Weight(lbs): 217 Blood Pressure 114/56 (mmHg): Body Mass Index(BMI): 28 Temperature(F): 98.2 Respiratory Rate 18 (breaths/min): Photos: [1:No Photos] [N/A:N/A] Wound Location: [1:Left Metatarsal head first - N/A Medial] Wounding Event: [1:Gradually Appeared] [N/A:N/A] Primary Etiology: [1:Diabetic Wound/Ulcer of N/A the Lower Extremity] Comorbid History: [1:Chronic Obstructive Pulmonary Disease (COPD), Coronary Artery Disease, Hypertension, Peripheral Arterial Disease, Type II Diabetes, Neuropathy] [N/A:N/A] Date Acquired: [1:01/15/2016] [N/A:N/A] Weeks of Treatment: [1:2] [N/A:N/A] Wound Status: [1:Open] [N/A:N/A] Pending Amputation on Yes [N/A:N/A] Presentation: Measurements L  x W x D 1.9x1.2x0.1 [N/A:N/A] (cm) Area (cm) : [1:1.791] [N/A:N/A] Volume (cm) : [1:0.179] [N/A:N/A] % Reduction in Area: [1:-52.00%] [N/A:N/A] % Reduction in Volume: 24.20% [N/A:N/A] Classification: [1:Grade 2] [N/A:N/A] Exudate Amount: [1:Medium] [N/A:N/A] Exudate Type: [1:Sanguinous] [N/A:N/A] Exudate Color: [1:red] [N/A:N/A] Wound Margin: [1:Flat and Intact] [N/A:N/A] Granulation Amount: [1:None Present (0%)] [N/A:N/A] Necrotic Amount: Large (67-100%) N/A N/A Necrotic Tissue: Eschar N/A N/A Exposed Structures: Fascia: No N/A N/A Fat: No Tendon: No Muscle: No Joint: No Bone: No Limited to Skin Breakdown Epithelialization: None N/A N/A Periwound Skin Texture: Edema: Yes N/A N/A Callus: Yes Excoriation: No Induration: No Crepitus: No Fluctuance: No Friable: No Rash: No Scarring: No Periwound Skin Dry/Scaly: Yes N/A N/A Moisture: Maceration: No Moist: No Periwound Skin Color: Erythema: Yes N/A N/A Atrophie Blanche: No Cyanosis: No Ecchymosis: No Hemosiderin Staining: No Mottled: No Pallor: No Rubor: No Erythema Location: Circumferential N/A N/A Temperature: No Abnormality N/A N/A Tenderness on No N/A  N/A Palpation: Wound Preparation: Ulcer Cleansing: N/A N/A Rinsed/Irrigated with Saline Topical Anesthetic Applied: Other: lidocaine 4% Treatment Notes Electronic Signature(s) Signed: 02/22/2016 4:03:09 PM By: Montey Hora Entered By: Montey Hora on 02/22/2016 14:51:48 [name] Small ([name] Small ([id number]) -------------------------------------------------------------------------------- [place] Details Patient Name: [name] Small Date of Service: 02/22/2016 2:15 PM Medical Record Number: [id number] Patient Account Number: [id number] Date of Birth/Sex: Mar 10, 1928 (82 y.o. Male) Treating RN: Montey Hora Primary Care Physician: [name] Other Clinician: Referring Physician: [name] Treating Physician/Extender: Frann Rider in Treatment: 2 Active Inactive Abuse / Safety / Falls / Self Care Management Nursing Diagnoses: Potential for falls Goals: Patient will remain injury free Date Initiated: 02/08/2016 Goal Status: Active Interventions: Assess fall risk on admission and as needed Notes: Orientation to the Wound Care Program Nursing Diagnoses: Knowledge deficit related to the wound healing center program Goals: Patient/caregiver will verbalize understanding of the [facility] Program Date Initiated: 02/08/2016 Goal Status: Active Interventions: Provide education on orientation to the wound center Notes: Wound/Skin Impairment Nursing Diagnoses: Impaired tissue integrity Goals: Patient/caregiver will verbalize understanding of skin care regimen Date Initiated: 02/08/2016 [name] Small ([id number]) Goal Status: Active Ulcer/skin breakdown will have a volume reduction of 30% by week 4 Date Initiated: 02/08/2016 Goal Status: Active Ulcer/skin breakdown will have a volume reduction of 50% by week 8 Date Initiated: 02/08/2016 Goal Status: Active Ulcer/skin breakdown will have a volume  reduction of 80% by week 12 Date Initiated: 02/08/2016 Goal Status: Active Ulcer/skin breakdown will heal within 14 weeks Date Initiated: 02/08/2016 Goal Status: Active Interventions: Assess patient/caregiver ability to obtain necessary supplies Assess patient/caregiver ability to perform ulcer/skin care regimen upon admission and as needed Assess ulceration(s) every visit Notes: Electronic Signature(s) Signed: 02/22/2016 4:03:09 PM By: Montey Hora Entered By: Montey Hora on 02/22/2016 14:51:35 [name] Small ([id number]) -------------------------------------------------------------------------------- Patient/Caregiver Education Details Patient Name: [name] Small Date of Service: 02/22/2016 2:15 PM Medical Record Number: [id number] Patient Account Number: [id number] Date of Birth/Gender: [birth year] (82 y.o. Male) Treating RN: Montey Hora Primary Care Physician: [name] Other Clinician: Referring Physician: [name] Treating Physician/Extender: Frann Rider in Treatment: 2 Education Assessment Education Provided To: Patient Education Topics Provided Wound/Skin Impairment: Handouts: Other: wound care as ordered Methods: Demonstration, Explain/Verbal Responses: State content correctly Electronic Signature(s) Signed: 02/22/2016 3:25:41 PM By: Montey Hora Entered By: Montey Hora on 02/22/2016 15:25:41 [name] Small ([id number]) -------------------------------------------------------------------------------- Wound Assessment Details Patient Name: [name] Small Date of Service: 02/22/2016 2:15 PM Medical Record Number: [id number] Patient  Account Number: [id number] Date of Birth/Sex: June 16, 1928 (82 y.o. Male) Treating RN: Montey Hora Primary Care Physician: [name] Other Clinician: Referring Physician: [name] Treating Physician/Extender: Frann Rider in Treatment: 2 Wound Status Wound Number: 1 Primary  Diabetic Wound/Ulcer of the Lower Etiology: Extremity Wound Location: Left Metatarsal head first - Medial Wound Open Status: Wounding Event: Gradually Appeared Comorbid Chronic Obstructive Pulmonary Disease Date Acquired: 01/15/2016 History: (COPD), Coronary Artery Disease, Weeks Of Treatment: 2 Hypertension, Peripheral Arterial Clustered Wound: No Disease, Type II Diabetes, Neuropathy Pending Amputation On Presentation Wound Measurements Length: (cm) 1.9 Width: (cm) 1.2 Depth: (cm) 0.1 Area: (cm) 1.791 Volume: (cm) 0.179 % Reduction in Area: -52% % Reduction in Volume: 24.2% Epithelialization: None Tunneling: No Undermining: No Wound Description Classification: Grade 2 Wound Margin: Flat and Intact Exudate Amount: Medium Exudate Type: Sanguinous Exudate Color: red Foul Odor After Cleansing: No Wound Bed Granulation Amount: None Present (0%) Exposed Structure Necrotic Amount: Large (67-100%) Fascia Exposed: No Necrotic Quality: Eschar Fat Layer Exposed: No Tendon Exposed: No Muscle Exposed: No Joint Exposed: No Bone Exposed: No Limited to Skin Breakdown Periwound Skin Texture Texture Color No Abnormalities Noted: No No Abnormalities Noted: No Callus: Yes Atrophie Blanche: No Crepitus: No Cyanosis: No [name] Small, [name] Small ([id number]) Excoriation: No Ecchymosis: No Fluctuance: No Erythema: Yes Friable: No Erythema Location: Circumferential Induration: No Hemosiderin Staining: No Localized Edema: Yes Mottled: No Rash: No Pallor: No Scarring: No Rubor: No Moisture Temperature / Pain No Abnormalities Noted: No Temperature: No Abnormality Dry / Scaly: Yes Maceration: No Moist: No Wound Preparation Ulcer Cleansing: Rinsed/Irrigated with Saline Topical Anesthetic Applied: Other: lidocaine 4%, Treatment Notes Wound #1 (Left, Medial Metatarsal head first) 1. Cleansed with: Clean wound with Normal Saline 2. Anesthetic Topical Lidocaine 4% cream to  wound bed prior to debridement 4. Dressing Applied: Santyl Ointment 5. Secondary Dressing Applied Gauze and Kerlix/Conform Electronic Signature(s) Signed: 02/22/2016 4:03:09 PM By: Montey Hora Entered By: Montey Hora on 02/22/2016 14:37:39 [name] Small ([id number]) -------------------------------------------------------------------------------- [place] Details Patient Name: [name] Small Date of Service: 02/22/2016 2:15 PM Medical Record Number: [id number] Patient Account Number: [id number] Date of Birth/Sex: Mar 20, 1928 (82 y.o. Male) Treating RN: Montey Hora Primary Care Physician: [name] Other Clinician: Referring Physician: [name] Treating Physician/Extender: Frann Rider in Treatment: 2 Vital Signs Time Taken: 14:31 Temperature (F): 98.2 Height (in): 74 Pulse (bpm): 70 Weight (lbs): 217 Respiratory Rate (breaths/min): 18 Body Mass Index (BMI): 27.9 Blood Pressure (mmHg): 114/56 Reference Range: 80 - 120 mg / dl Electronic Signature(s) Signed: 02/22/2016 4:03:09 PM By: Montey Hora Entered By: Montey Hora on 02/22/2016 14:32:47

## 2016-02-23 NOTE — Progress Notes (Signed)
Cardiology Office Note   Date:  02/23/2016   ID:  [name] Small, DOB 1928-10-13, MRN [id number]  PCP:  [name], MD  Cardiologist:   Dr. Meda Coffee  Chief Complaint  Patient presents with  . other    Discuss abd aortogram results. Meds reviewed verbally with pt.      History of Present Illness: [name] Small is a 82 y.o. male who presents for a follow up visit regarding peripheral arterial disease. The patient has known history of mild nonobstructive coronary artery disease,Chronic kidney disease, hypertension and diabetes. He quit smoking more than 40 years ago. He had bladder cancer in 2012 treated with chemotherapy.  He had noninvasive vascular evaluation in March of this year which showed an ABI of 1.0 on the right and 0.71 on the left. There was mild nonobstructive bilateral SFA disease. There was one-vessel runoff bilaterally below the knee. His symptoms did not improve with cilostazol or gabapentin.   He developed an open wound on the left medial part of first metatarsal space.  I proceeded with left lower extremity angiography which showed an occluded distal left popliteal artery with reconstitution via extensive collaterals to the proximal to mid peroneal artery which was the only patent vessel below the knee. Given the presence of extensive collaterals and the fact that the patient has advanced chronic kidney disease, I did not proceed with endovascular intervention. The patient has been attending at the wound clinic and there has been no improvement in the wound. I reviewed the angiogram with him today.   Past Medical History  Diagnosis Date  . BENIGN PROSTATIC HYPERTROPHY 05/21/2007  . DIABETES MELLITUS, TYPE II 05/21/2007  . DIVERTICULOSIS, COLON 05/21/2007    pt denies  . HYPERTENSION 05/21/2007  . PSORIASIS, SCALP 10/25/2008  . Cellulitis of left foot     Hospitalized in 2006  . Gross hematuria     pt denies  . Psoriasis ELBOWS  . Fatigue AGE-RELATED  .  Arthritis   . CAD (coronary artery disease) CARDIOLOGIST- DR WALL - VISIT IN JUNE 2012 FOR CATH    nonobstructive by cath 6/12:  mid LAD 30%, proximal obtuse marginal-2 30%, proximal RCA 20%, mid RCA 30-40%.  He had normal cardiac output and mildly elevated filling pressures but no significant pulmonary hypertension;   Echocardiogram in May 2012 demonstrated EF 50-55% and left atrial enlargement   . Impaired hearing BILATERAL HEARING AIDS  . Bladder cancer ([place])     2013  . Chronic kidney disease (CKD), stage III (moderate) 12/04/2007    FOLLOWED BY DR PATEL  . Back pain     s/p lumbar injection 2014    Past Surgical History  Procedure Laterality Date  . Vasectomy  15 YRS AGO  W/ ANES.  Marland Kitchen Penile prosthesis implant  15 YRS AGO  . Cardiovascular stress test  2007  . Cardiac catheterization  04-24-11/  DR Tatyana Biber    MILD NONOBSTRUCTIVE CAD, NORMA CARDIAC OUTPUT  . Appendectomy  AGE 35  . Tonsillectomy and adenoidectomy  AGE 32 YRS AGO  . Cataract extraction w/ intraocular lens  implant, bilateral    . Incision and drainage foot  LEFT FOOT DUE TO INFECTION FROM  NAIL PUNCTURE INJURY  . Cystoscopy w/ retrogrades  10/30/2011    Procedure: CYSTOSCOPY WITH RETROGRADE PYELOGRAM;  Surgeon: Molli Hazard, MD;  Location: [facility];  Service: Urology;  Laterality: Bilateral;  CYSTOSCOPY POSS TURBT BILATERAL RETROGRADE PYLEOGRAM POSS BILATERAL URETEROSCOPY WITH  BIOPSY LASER ABLATION OF LESION POSS BILATERAL URETERAL STENT PLACEMENT   CARM FLEXIBLE URETERAL SCOPE DIGITAL URETERAL SCOPE PK GYRUS HO  . Transurethral resection of bladder tumor  10/30/2011    Procedure: TRANSURETHRAL RESECTION OF BLADDER TUMOR (TURBT);  Surgeon: Molli Hazard, MD;  Location: [facility];  Service: Urology;  Laterality: N/A;  . Cystoscopy  12/25/2011    Procedure: CYSTOSCOPY;  Surgeon: Molli Hazard, MD;  Location: [facility];  Service:  Urology;  Laterality: N/A;  needs intubation and to be paralyzed   . Transurethral resection of bladder tumor  12/25/2011    Procedure: TRANSURETHRAL RESECTION OF BLADDER TUMOR (TURBT);  Surgeon: Molli Hazard, MD;  Location: [facility];  Service: Urology;  Laterality: N/A;  need long gyrus instruments Dr states he has spoken w/ Nelwyn Salisbury regarding this    . Cystoscopy w/ retrogrades  11/30/2012    Procedure: CYSTOSCOPY WITH RETROGRADE PYELOGRAM;  Surgeon: Molli Hazard, MD;  Location: [facility];  Service: Urology;  Laterality: Bilateral;  Flexible cystoscopy.  . Cystoscopy with biopsy  11/30/2012    Procedure: CYSTOSCOPY WITH BIOPSY;  Surgeon: Molli Hazard, MD;  Location: [facility];  Service: Urology;  Laterality: N/A;  . Peripheral vascular catheterization N/A 02/14/2016    Procedure: Abdominal Aortogram w/Lower Extremity;  Surgeon: Wellington Hampshire, MD;  Location: [place] [facility];  Service: Cardiovascular;  Laterality: N/A;     Current Outpatient Prescriptions  Medication Sig Dispense Refill  . acetaminophen (TYLENOL) 500 MG tablet Take 1,000 mg by mouth every 8 (eight) hours as needed for mild pain, moderate pain or headache.    Marland Kitchen amLODipine (NORVASC) 5 MG tablet Take 1 tablet (5 mg total) by mouth daily.    Marland Kitchen atorvastatin (LIPITOR) 10 MG tablet Take 1 tablet (10 mg total) by mouth daily. 90 tablet 3  . budesonide-formoterol (SYMBICORT) 160-4.5 MCG/ACT inhaler Inhale 2 puffs into the lungs 2 (two) times daily as needed.    . clobetasol (TEMOVATE) 0.05 % external solution Apply 1 application topically daily.    . clopidogrel (PLAVIX) 75 MG tablet Take 1 tablet by mouth  daily 90 tablet 1  . collagenase (SANTYL) ointment Apply 1 application topically daily.    . furosemide (LASIX) 40 MG tablet TAKE 1 TABLET BY MOUTH  EVERY MONDAY, WEDNESDAY,  AND FRIDAY 45 tablet 1  . gabapentin (NEURONTIN) 100 MG capsule Take 1 capsule  (100 mg total) by mouth 3 (three) times daily. 270 capsule 3  . glipiZIDE (GLUCOTROL XL) 5 MG 24 hr tablet Take 1 tablet by mouth  daily with breakfast 90 tablet 1  . insulin glargine (LANTUS) 100 UNIT/ML injection Inject 25 Units into the skin daily at 3 pm.     No current facility-administered medications for this visit.    Allergies:   Actos; Aspirin; Ace inhibitors; Gabapentin; Lyrica; Pravastatin; Bactrim; and Sulfa drugs cross reactors    Social History:  The patient  reports that he quit smoking about 47 years ago. His smoking use included Cigarettes. He has a 44 pack-year smoking history. He has never used smokeless tobacco. He reports that he does not drink alcohol or use illicit drugs.   Family History:  The patient's family history includes Diabetes in his mother; Heart disease in his brother, brother, brother, brother, brother, and brother; Lung cancer in his brother.    ROS:  Please see the history of present illness.   Otherwise, review  of systems are positive for none.   All other systems are reviewed and negative.    PHYSICAL EXAM: VS:  BP 104/60 mmHg  Pulse 67  Ht 6\' 2"  (1.88 m)  Wt 215 lb (97.523 kg)  BMI 27.59 kg/m2 , BMI Body mass index is 27.59 kg/(m^2). GEN: Well nourished, well developed, in no acute distress HEENT: normal Neck: no JVD, carotid bruits, or masses Cardiac: RRR; no murmurs, rubs, or gallops,no edema  Respiratory:  clear to auscultation bilaterally, normal work of breathing GI: soft, nontender, nondistended, + BS MS: no deformity or atrophy Skin: warm and dry, no rash Neuro:  Strength and sensation are intact Psych: euthymic mood, full affect Vascular: Femoral pulses are normal bilaterally. Distal pulses are not palpable with cold feet. He has a 1 x 1 cm necrotic ulcer on the left medial metatarsal space  EKG:  EKG is ordered today. It showed normal sinus rhythm with no significant ST or T wave changes.    Recent Labs: 11/28/2015: ALT  12 02/12/2016: Platelets 245 02/14/2016: BUN 30*; Creatinine, Ser 1.88*; Potassium 4.5; Sodium 142    Lipid Panel    Component Value Date/Time   CHOL 116* 11/28/2015 0933   TRIG 81 11/28/2015 0933   HDL 43 11/28/2015 0933   CHOLHDL 2.7 11/28/2015 0933   VLDL 16 11/28/2015 0933   LDLCALC 57 11/28/2015 0933   LDLDIRECT 147.6 04/27/2009 1012      Wt Readings from Last 3 Encounters:  02/23/16 215 lb (97.523 kg)  02/19/16 216 lb (97.977 kg)  02/19/16 216 lb (97.977 kg)       ASSESSMENT AND PLAN:   1. Peripheral arterial disease:  He Continues to have nonhealed wound on the left foot.  There has been no improvement in spite of aggressive wound care. Thus, I think we have to proceed with attempted revascularization of his left popliteal artery into the peroneal artery to establish in-line flow. If not successful, the patient might need bypass. I discussed the procedure in detail today as well as risks and benefits. Again I would bring him here early for hydration given his chronic kidney disease with a low threshold for overnight observation. Continue treatment with Plavix.  2. Hyperlipidemia: Continue treatment with atorvastatin with a target LDL of less than 70.   3. Chronic kidney disease: High risk for contrast-induced nephropathy. Follow the precautions as outlined above.      Disposition:   FU with me in 1 months  Signed,  [name], MD  02/23/2016 6:28 PM    [place]

## 2016-02-23 NOTE — Progress Notes (Signed)
[name] Small, [name] Small ([id number]) Visit Report for 02/22/2016 Chief Complaint Document Details Patient Name: [name] Small, [name] Small. Date of Service: 02/22/2016 2:15 PM Medical Record Number: [id number] Patient Account Number: [id number] Date of Birth/Sex: [birth year] (81 y.o. Male) Treating RN: Montey Hora Primary Care Physician: [name] Other Clinician: Referring Physician: [name] Treating Physician/Extender: Frann Rider in Treatment: 2 Information Obtained from: Patient Chief Complaint Patient presents to the wound care center today with an open arterial ulcer to the left medial forefoot and is also known to be a diabetic and has had these problems at least for 6 weeks Electronic Signature(s) Signed: 02/22/2016 2:55:08 PM By: Christin Fudge MD, FACS Entered By: Christin Fudge on 02/22/2016 14:55:07 [name] Small ([id number]) -------------------------------------------------------------------------------- HPI Details Patient Name: [name] Small Date of Service: 02/22/2016 2:15 PM Medical Record Number: [id number] Patient Account Number: [id number] Date of Birth/Sex: 1928/01/04 (82 y.o. Male) Treating RN: Montey Hora Primary Care Physician: [name] Other Clinician: Referring Physician: [name] Treating Physician/Extender: Frann Rider in Treatment: 2 History of Present Illness Location: ulcerated area on the left medial foot Quality: Patient reports rest pain to affected area(s). he also has typical symptoms of claudication Severity: Patient states wound are getting worse. Duration: Patient has had the wound for < 6 weeks prior to presenting for treatment Timing: Pain in wound is constant (hurts all the time) Context: The wound occurred when the patient patient off of a callus or scab from that area and noticed the ulceration Modifying Factors: Other treatment(s) tried include:hydrogen peroxide and local ointments Associated Signs and  Symptoms: Patient reports having:rest pain and typical pain of claudication HPI Description: 82 year old gentleman who was seen by his PCP recently having had a injury to the left medial foot after he peeled of a callus. He has been using clobetasol and peroxide. He's had a history of having peripheral arterial disease in both lower extremities. On inquiring with the patient he has clear symptoms of claudication and has had this for over a year and a half. Past medical history significant for diabetes mellitus type 2, BPH, diverticulosis, hypertension, psoriasis, cellulitis of the left foot treated with hospitalization in 2006, coronary artery disease, bladder cancer in 2013, chronic kidney disease stage III, status post vasectomy,penile prosthesis, cardiac, appendectomy, tonsillectomy, incision and drainage of foot abscess, patient was recently started on Keflex for 10 days and referred to the wound clinic. Of note the patient was worked up with vascular tests in March 2016 and was referred to Dr. Gwendalyn Ege who saw him on 10/17/2015 and noted an ABI of 1.0 on the right and 0.71 on the left and there was mild nonobstructive bilateral SFA disease. The patient had a one-vessel runoff bilaterally below the knee and the patient had no lower extremity ulceration. due to the nature of the below knee arterial disease he recommended medical therapy including cilostazol and Plavix. the patient was also put on a small dose of atorvastatin 10 mg daily. the patient was reviewed again on 01/16/2016 and at that stage the ulcer on the left foot was not noted. Medical management was continued and he would be seen in follow-up in 6 months time. Addendum: -- I spoke to Dr. Inda Merlin Aredia's office, and the call was taken by his colleague Dr. Esmond Plants -- he discussed the case with me in detail and would have the patient see Dr. Lovette Cliche on Monday when he was back from vacation. 02/15/2016 -- o I had a call  from Dr. Earlie Server  Arida who saw the patient today and agrees that the patient needs an intervention with possibly stenting or angioplasty. He is going to set him up for this. X-ray of the left foot IMPRESSION:No definite evidence of osteomyelitis or fracture. He was taken up for a procedure by Dr. Jacqulyn Cane on 02/14/2016 Conclusion :1. No significant aortoiliac disease.2. Mild diffuse nonobstructive disease affecting the left SFA. [name] Small, [name] Small ([id number]) 3. Occluded distal left popliteal artery with reconstitution via extensive collaterals into the proximal to mid peroneal artery which extends all the way down to the foot. This is the only patent vessel below the knee. Recommendations:The patient has extensive collaterals and there is a reasonable chance of being able to heal the ulcer without revascularization. I recommend wound care as being done. If there is no improvement within 2 weeks, then an endovascular attempt of opening the popliteal occlusion into the peroneal artery will be pursued. Electronic Signature(s) Signed: 02/22/2016 2:55:20 PM By: Christin Fudge MD, FACS Entered By: Christin Fudge on 02/22/2016 14:55:19 [name] Small ([id number]) -------------------------------------------------------------------------------- Physical Exam Details Patient Name: [name] Small Date of Service: 02/22/2016 2:15 PM Medical Record Number: [id number] Patient Account Number: [id number] Date of Birth/Sex: 12-26-1927 (82 y.o. Male) Treating RN: Montey Hora Primary Care Physician: [name] Other Clinician: Referring Physician: [name] Treating Physician/Extender: Frann Rider in Treatment: 2 Constitutional . Pulse regular. Respirations normal and unlabored. Afebrile. . Eyes Nonicteric. Reactive to light. Ears, Nose, Mouth, and Throat Lips, teeth, and gums WNL.Marland Kitchen Moist mucosa without lesions. Neck supple and nontender. No palpable supraclavicular or  cervical adenopathy. Normal sized without goiter. Respiratory WNL. No retractions.. Cardiovascular Pedal Pulses WNL. No clubbing, cyanosis or edema. Lymphatic No adneopathy. No adenopathy. No adenopathy. Musculoskeletal Adexa without tenderness or enlargement.. Digits and nails w/o clubbing, cyanosis, infection, petechiae, ischemia, or inflammatory conditions.. Integumentary (Hair, Skin) No suspicious lesions. No crepitus or fluctuance. No peri-wound warmth or erythema. No masses.Marland Kitchen Psychiatric Judgement and insight Intact.. No evidence of depression, anxiety, or agitation.. Notes the left forefoot in the region of the medial part of the first metatarsal continues to have significant amount of debris and there is minimal surrounding cellulitis. No debridement was done today. Electronic Signature(s) Signed: 02/22/2016 2:56:12 PM By: Christin Fudge MD, FACS Entered By: Christin Fudge on 02/22/2016 14:56:11 [name] Small ([id number]) -------------------------------------------------------------------------------- Physician Orders Details Patient Name: [name] Small Date of Service: 02/22/2016 2:15 PM Medical Record Number: [id number] Patient Account Number: [id number] Date of Birth/Sex: 07/22/1928 (82 y.o. Male) Treating RN: Montey Hora Primary Care Physician: [name] Other Clinician: Referring Physician: [name] Treating Physician/Extender: Frann Rider in Treatment: 2 Verbal / Phone Orders: Yes Clinician: Montey Hora Read Back and Verified: Yes Diagnosis Coding Wound Cleansing Wound #1 Left,Medial Metatarsal head first o Clean wound with Normal Saline. o Cleanse wound with mild soap and water Anesthetic Wound #1 Left,Medial Metatarsal head first o Topical Lidocaine 4% cream applied to wound bed prior to debridement Primary Wound Dressing Wound #1 Left,Medial Metatarsal head first o Santyl Ointment Secondary Dressing Wound #1  Left,Medial Metatarsal head first o Gauze and Kerlix/Conform Dressing Change Frequency Wound #1 Left,Medial Metatarsal head first o Change dressing every day. Follow-up Appointments Wound #1 Left,Medial Metatarsal head first o Return Appointment in 1 week. Additional Orders / Instructions Wound #1 Left,Medial Metatarsal head first o Increase protein intake. o Other: - work to keep blood sugars under 180 consistently Electronic Signature(s) Signed: 02/22/2016 4:03:09 PM By: Montey Hora Signed: 02/22/2016 4:13:42 PM By:  Christin Fudge MD, FACS [place], [place] ([id number]) Entered By: Montey Hora on 02/22/2016 14:53:02 [name] Small ([id number]) -------------------------------------------------------------------------------- Problem List Details Patient Name: [name] Small Date of Service: 02/22/2016 2:15 PM Medical Record Number: [id number] Patient Account Number: [id number] Date of Birth/Sex: 02/18/1928 (82 y.o. Male) Treating RN: Montey Hora Primary Care Physician: [name] Other Clinician: Referring Physician: [name] Treating Physician/Extender: Frann Rider in Treatment: 2 Active Problems ICD-10 Encounter Code Description Active Date Diagnosis E11.621 Type 2 diabetes mellitus with foot ulcer 02/08/2016 Yes I70.245 Atherosclerosis of native arteries of left leg with ulceration 02/08/2016 Yes of other part of foot L97.522 Non-pressure chronic ulcer of other part of left foot with fat 02/08/2016 Yes layer exposed Inactive Problems Resolved Problems Electronic Signature(s) Signed: 02/22/2016 2:54:56 PM By: Christin Fudge MD, FACS Entered By: Christin Fudge on 02/22/2016 14:54:55 [name] Small ([id number]) -------------------------------------------------------------------------------- Progress Note Details Patient Name: [name] Small Date of Service: 02/22/2016 2:15 PM Medical Record Number: [id number] Patient Account Number:  [id number] Date of Birth/Sex: 09-Jan-1928 (82 y.o. Male) Treating RN: Montey Hora Primary Care Physician: [name] Other Clinician: Referring Physician: [name] Treating Physician/Extender: Frann Rider in Treatment: 2 Subjective Chief Complaint Information obtained from Patient Patient presents to the wound care center today with an open arterial ulcer to the left medial forefoot and is also known to be a diabetic and has had these problems at least for 6 weeks History of Present Illness (HPI) The following HPI elements were documented for the patient's wound: Location: ulcerated area on the left medial foot Quality: Patient reports rest pain to affected area(s). he also has typical symptoms of claudication Severity: Patient states wound are getting worse. Duration: Patient has had the wound for < 6 weeks prior to presenting for treatment Timing: Pain in wound is constant (hurts all the time) Context: The wound occurred when the patient patient off of a callus or scab from that area and noticed the ulceration Modifying Factors: Other treatment(s) tried include:hydrogen peroxide and local ointments Associated Signs and Symptoms: Patient reports having:rest pain and typical pain of claudication 82 year old gentleman who was seen by his PCP recently having had a injury to the left medial foot after he peeled of a callus. He has been using clobetasol and peroxide. He's had a history of having peripheral arterial disease in both lower extremities. On inquiring with the patient he has clear symptoms of claudication and has had this for over a year and a half. Past medical history significant for diabetes mellitus type 2, BPH, diverticulosis, hypertension, psoriasis, cellulitis of the left foot treated with hospitalization in 2006, coronary artery disease, bladder cancer in 2013, chronic kidney disease stage III, status post vasectomy,penile prosthesis, cardiac,  appendectomy, tonsillectomy, incision and drainage of foot abscess, patient was recently started on Keflex for 10 days and referred to the wound clinic. Of note the patient was worked up with vascular tests in March 2016 and was referred to Dr. Gwendalyn Ege who saw him on 10/17/2015 and noted an ABI of 1.0 on the right and 0.71 on the left and there was mild nonobstructive bilateral SFA disease. The patient had a one-vessel runoff bilaterally below the knee and the patient had no lower extremity ulceration. due to the nature of the below knee arterial disease he recommended medical therapy including cilostazol and Plavix. the patient was also put on a small dose of atorvastatin 10 mg daily. the patient was reviewed again on 01/16/2016 and at that stage the  ulcer on the left foot was not noted. Medical management was continued and he would be seen in follow-up in 6 months time. Addendum: -- I spoke to Dr. Inda Merlin Aredia's office, and the call was taken by his colleague Dr. Esmond Plants -- he discussed the case with me in detail and would have the patient see Dr. Lovette Cliche on Monday [place]. ([id number]) when he was back from vacation. 02/15/2016 -- I had a call from Dr. Jacqulyn Cane who saw the patient today and agrees that the patient needs an intervention with possibly stenting or angioplasty. He is going to set him up for this. X-ray of the left foot IMPRESSION:No definite evidence of osteomyelitis or fracture. He was taken up for a procedure by Dr. Jacqulyn Cane on 02/14/2016 Conclusion :1. No significant aortoiliac disease.2. Mild diffuse nonobstructive disease affecting the left SFA. 3. Occluded distal left popliteal artery with reconstitution via extensive collaterals into the proximal to mid peroneal artery which extends all the way down to the foot. This is the only patent vessel below the knee. Recommendations:The patient has extensive collaterals and there is a reasonable  chance of being able to heal the ulcer without revascularization. I recommend wound care as being done. If there is no improvement within 2 weeks, then an endovascular attempt of opening the popliteal occlusion into the peroneal artery will be pursued. Objective Constitutional Pulse regular. Respirations normal and unlabored. Afebrile. Vitals Time Taken: 2:31 PM, Height: 74 in, Weight: 217 lbs, BMI: 27.9, Temperature: 98.2 F, Pulse: 70 bpm, Respiratory Rate: 18 breaths/min, Blood Pressure: 114/56 mmHg. Eyes Nonicteric. Reactive to light. Ears, Nose, Mouth, and Throat Lips, teeth, and gums WNL.Marland Kitchen Moist mucosa without lesions. Neck supple and nontender. No palpable supraclavicular or cervical adenopathy. Normal sized without goiter. Respiratory WNL. No retractions.. Cardiovascular Pedal Pulses WNL. No clubbing, cyanosis or edema. Lymphatic No adneopathy. No adenopathy. No adenopathy. Musculoskeletal Adexa without tenderness or enlargement.. Digits and nails w/o clubbing, cyanosis, infection, petechiae, Hieronymus, Jaydian N. ([id number]) ischemia, or inflammatory conditions.Marland Kitchen Psychiatric Judgement and insight Intact.. No evidence of depression, anxiety, or agitation.. General Notes: the left forefoot in the region of the medial part of the first metatarsal continues to have significant amount of debris and there is minimal surrounding cellulitis. No debridement was done today. Integumentary (Hair, Skin) No suspicious lesions. No crepitus or fluctuance. No peri-wound warmth or erythema. No masses.. Wound #1 status is Open. Original cause of wound was Gradually Appeared. The wound is located on the Left,Medial Metatarsal head first. The wound measures 1.9cm length x 1.2cm width x 0.1cm depth; 1.791cm^2 area and 0.179cm^3 volume. The wound is limited to skin breakdown. There is no tunneling or undermining noted. There is a medium amount of sanguinous drainage noted. The wound margin is flat  and intact. There is no granulation within the wound bed. There is a large (67-100%) amount of necrotic tissue within the wound bed including Eschar. The periwound skin appearance exhibited: Callus, Localized Edema, Dry/Scaly, Erythema. The periwound skin appearance did not exhibit: Crepitus, Excoriation, Fluctuance, Friable, Induration, Rash, Scarring, Maceration, Moist, Atrophie Blanche, Cyanosis, Ecchymosis, Hemosiderin Staining, Mottled, Pallor, Rubor. The surrounding wound skin color is noted with erythema which is circumferential. Periwound temperature was noted as No Abnormality. Assessment Active Problems ICD-10 E11.621 - Type 2 diabetes mellitus with foot ulcer I70.245 - Atherosclerosis of native arteries of left leg with ulceration of other part of foot L97.522 - Non-pressure chronic ulcer of other part of left foot with fat layer exposed  Plan Wound Cleansing: Wound #1 Left,Medial Metatarsal head first: Clean wound with Normal Saline. Cleanse wound with mild soap and water Anesthetic: Wound #1 Left,Medial Metatarsal head first: Topical Lidocaine 4% cream applied to wound bed prior to debridement Primary Wound Dressing: [name] Small, [name] Small ([id number]) Wound #1 Left,Medial Metatarsal head first: Santyl Ointment Secondary Dressing: Wound #1 Left,Medial Metatarsal head first: Gauze and Kerlix/Conform Dressing Change Frequency: Wound #1 Left,Medial Metatarsal head first: Change dressing every day. Follow-up Appointments: Wound #1 Left,Medial Metatarsal head first: Return Appointment in 1 week. Additional Orders / Instructions: Wound #1 Left,Medial Metatarsal head first: Increase protein intake. Other: - work to keep blood sugars under 180 consistently I have recommended: 1. Santyl ointment locally with a bordered foam to be changed daily 2. Dr. Julien Nordmann Arida's he is going to review the patient tomorrow and since has been no improvement in his wounds over the last 2 weeks I  would strongly recommend angioplasty to help improve the blood flow to this area. This has been discussed with the patient. 3. Reviewing the wound care center for further management next week. Electronic Signature(s) Signed: 02/22/2016 2:57:47 PM By: Christin Fudge MD, FACS Entered By: Christin Fudge on 02/22/2016 14:57:47 [name] Small ([id number]) -------------------------------------------------------------------------------- SuperBill Details Patient Name: [name] Small Date of Service: 02/22/2016 Medical Record Number: [id number] Patient Account Number: [id number] Date of Birth/Sex: 01-05-1928 (82 y.o. Male) Treating RN: Montey Hora Primary Care Physician: [name] Other Clinician: Referring Physician: [name] Treating Physician/Extender: Frann Rider in Treatment: 2 Diagnosis Coding ICD-10 Codes Code Description [phone] Type 2 diabetes mellitus with foot ulcer I70.245 Atherosclerosis of native arteries of left leg with ulceration of other part of foot L97.522 Non-pressure chronic ulcer of other part of left foot with fat layer exposed Facility Procedures CPT4 Code: [id number] Description: 99213 - WOUND CARE VISIT-LEV 3 EST PT Modifier: Quantity: 1 Physician Procedures CPT4: Description Modifier Quantity Code [id number] 99213 - WC PHYS LEVEL 3 - EST PT 1 ICD-10 Description Diagnosis E11.621 Type 2 diabetes mellitus with foot ulcer I70.245 Atherosclerosis of native arteries of left leg with ulceration of other part of foot  L97.522 Non-pressure chronic ulcer of other part of left foot with fat layer exposed Electronic Signature(s) Signed: 02/22/2016 2:58:06 PM By: Christin Fudge MD, FACS Entered By: Christin Fudge on 02/22/2016 14:58:06

## 2016-02-24 LAB — BASIC METABOLIC PANEL
BUN/Creatinine Ratio: 18 (ref 10–24)
BUN: 34 mg/dL — ABNORMAL HIGH (ref 8–27)
CO2: 25 mmol/L (ref 18–29)
Calcium: 9 mg/dL (ref 8.6–10.2)
Chloride: 100 mmol/L (ref 96–106)
Creatinine, Ser: 1.91 mg/dL — ABNORMAL HIGH (ref 0.76–1.27)
GFR calc Af Amer: 36 mL/min/{1.73_m2} — ABNORMAL LOW (ref >59)
GFR calc non Af Amer: 31 mL/min/{1.73_m2} — ABNORMAL LOW (ref >59)
Glucose: 117 mg/dL — ABNORMAL HIGH (ref 65–99)
Potassium: 5.6 mmol/L — ABNORMAL HIGH (ref 3.5–5.2)
Sodium: 145 mmol/L — ABNORMAL HIGH (ref 134–144)

## 2016-02-24 LAB — CBC
Hematocrit: 41.5 % (ref 37.5–51.0)
Hemoglobin: 14.1 g/dL (ref 12.6–17.7)
MCH: 32.6 pg (ref 26.6–33.0)
MCHC: 34 g/dL (ref 31.5–35.7)
MCV: 96 fL (ref 79–97)
Platelets: 273 10*3/uL (ref 150–379)
RBC: 4.32 x10E6/uL (ref 4.14–5.80)
RDW: 13.4 % (ref 12.3–15.4)
WBC: 8.5 10*3/uL (ref 3.4–10.8)

## 2016-02-24 LAB — PROTIME-INR
INR: 1 (ref 0.8–1.2)
Prothrombin Time: 10.4 s (ref 9.1–12.0)

## 2016-02-27 ENCOUNTER — Telehealth: Payer: Self-pay | Admitting: Cardiovascular Disease

## 2016-02-27 NOTE — Telephone Encounter (Signed)
lmov for patient to let him know we need to resch apt on 03/15/16, due to change in schedule

## 2016-02-28 ENCOUNTER — Ambulatory Visit (HOSPITAL_COMMUNITY)
Admission: RE | Admit: 2016-02-28 | Discharge: 2016-02-29 | Disposition: A | Discharge status: Home / Self Care | Payer: Medicare Other | Source: Ambulatory Visit | Attending: Cardiovascular Disease | Admitting: Cardiovascular Disease

## 2016-02-28 ENCOUNTER — Encounter (HOSPITAL_COMMUNITY): Payer: Self-pay | Admitting: Cardiovascular Disease

## 2016-02-28 ENCOUNTER — Encounter (HOSPITAL_COMMUNITY)
Admission: RE | Disposition: A | Discharge status: Home / Self Care | Payer: Self-pay | Source: Ambulatory Visit | Attending: Cardiovascular Disease

## 2016-02-28 ENCOUNTER — Other Ambulatory Visit: Payer: Self-pay

## 2016-02-28 DIAGNOSIS — E785 Hyperlipidemia, unspecified: Secondary | ICD-10-CM | POA: Insufficient documentation

## 2016-02-28 DIAGNOSIS — I129 Hypertensive chronic kidney disease with stage 1 through stage 4 chronic kidney disease, or unspecified chronic kidney disease: Secondary | ICD-10-CM | POA: Diagnosis not present

## 2016-02-28 DIAGNOSIS — Z7902 Long term (current) use of antithrombotics/antiplatelets: Secondary | ICD-10-CM | POA: Diagnosis not present

## 2016-02-28 DIAGNOSIS — I7092 Chronic total occlusion of artery of the extremities: Secondary | ICD-10-CM | POA: Diagnosis not present

## 2016-02-28 DIAGNOSIS — E1122 Type 2 diabetes mellitus with diabetic chronic kidney disease: Secondary | ICD-10-CM | POA: Insufficient documentation

## 2016-02-28 DIAGNOSIS — L7632 Postprocedural hematoma of skin and subcutaneous tissue following other procedure: Secondary | ICD-10-CM | POA: Diagnosis not present

## 2016-02-28 DIAGNOSIS — Z8551 Personal history of malignant neoplasm of bladder: Secondary | ICD-10-CM | POA: Insufficient documentation

## 2016-02-28 DIAGNOSIS — Z79899 Other long term (current) drug therapy: Secondary | ICD-10-CM | POA: Insufficient documentation

## 2016-02-28 DIAGNOSIS — L97529 Non-pressure chronic ulcer of other part of left foot with unspecified severity: Secondary | ICD-10-CM | POA: Insufficient documentation

## 2016-02-28 DIAGNOSIS — Z87891 Personal history of nicotine dependence: Secondary | ICD-10-CM | POA: Insufficient documentation

## 2016-02-28 DIAGNOSIS — Z9221 Personal history of antineoplastic chemotherapy: Secondary | ICD-10-CM | POA: Insufficient documentation

## 2016-02-28 DIAGNOSIS — Z794 Long term (current) use of insulin: Secondary | ICD-10-CM | POA: Diagnosis not present

## 2016-02-28 DIAGNOSIS — I2510 Atherosclerotic heart disease of native coronary artery without angina pectoris: Secondary | ICD-10-CM | POA: Diagnosis not present

## 2016-02-28 DIAGNOSIS — N189 Chronic kidney disease, unspecified: Secondary | ICD-10-CM | POA: Insufficient documentation

## 2016-02-28 DIAGNOSIS — I739 Peripheral vascular disease, unspecified: Secondary | ICD-10-CM

## 2016-02-28 DIAGNOSIS — I998 Other disorder of circulatory system: Secondary | ICD-10-CM | POA: Diagnosis present

## 2016-02-28 DIAGNOSIS — I70229 Atherosclerosis of native arteries of extremities with rest pain, unspecified extremity: Secondary | ICD-10-CM | POA: Diagnosis present

## 2016-02-28 DIAGNOSIS — N183 Chronic kidney disease, stage 3 (moderate): Secondary | ICD-10-CM | POA: Insufficient documentation

## 2016-02-28 DIAGNOSIS — Y831 Surgical operation with implant of artificial internal device as the cause of abnormal reaction of the patient, or of later complication, without mention of misadventure at the time of the procedure: Secondary | ICD-10-CM | POA: Insufficient documentation

## 2016-02-28 DIAGNOSIS — I70212 Atherosclerosis of native arteries of extremities with intermittent claudication, left leg: Secondary | ICD-10-CM | POA: Diagnosis not present

## 2016-02-28 DIAGNOSIS — L409 Psoriasis, unspecified: Secondary | ICD-10-CM | POA: Diagnosis not present

## 2016-02-28 DIAGNOSIS — S3012XA Contusion of groin, initial encounter: Secondary | ICD-10-CM | POA: Insufficient documentation

## 2016-02-28 DIAGNOSIS — S301XXA Contusion of abdominal wall, initial encounter: Secondary | ICD-10-CM | POA: Insufficient documentation

## 2016-02-28 DIAGNOSIS — I70245 Atherosclerosis of native arteries of left leg with ulceration of other part of foot: Secondary | ICD-10-CM | POA: Diagnosis not present

## 2016-02-28 HISTORY — PX: PERIPHERAL VASCULAR CATHETERIZATION: SHX172C

## 2016-02-28 HISTORY — DX: Pain in leg, unspecified: M79.606

## 2016-02-28 HISTORY — DX: Dependence on supplemental oxygen: Z99.81

## 2016-02-28 HISTORY — DX: Other chronic pain: G89.29

## 2016-02-28 HISTORY — DX: Peripheral vascular disease, unspecified: I73.9

## 2016-02-28 LAB — GLUCOSE, CAPILLARY
Glucose-Capillary: 78 mg/dL (ref 65–99)
Glucose-Capillary: 124 mg/dL — ABNORMAL HIGH (ref 65–99)
Glucose-Capillary: 68 mg/dL (ref 65–99)
Glucose-Capillary: 58 mg/dL — ABNORMAL LOW (ref 65–99)
Glucose-Capillary: 178 mg/dL — ABNORMAL HIGH (ref 65–99)
Glucose-Capillary: 166 mg/dL — ABNORMAL HIGH (ref 65–99)

## 2016-02-28 LAB — BASIC METABOLIC PANEL
Anion gap: 12 (ref 5–15)
BUN: 34 mg/dL — ABNORMAL HIGH (ref 6–20)
CO2: 29 mmol/L (ref 22–32)
Calcium: 8.3 mg/dL — ABNORMAL LOW (ref 8.9–10.3)
Chloride: 101 mmol/L (ref 101–111)
Creatinine, Ser: 1.77 mg/dL — ABNORMAL HIGH (ref 0.61–1.24)
GFR calc Af Amer: 39 mL/min — ABNORMAL LOW (ref >60)
GFR calc non Af Amer: 33 mL/min — ABNORMAL LOW (ref >60)
Glucose, Bld: 83 mg/dL (ref 65–99)
Potassium: 4.2 mmol/L (ref 3.5–5.1)
Sodium: 142 mmol/L (ref 135–145)

## 2016-02-28 LAB — POCT ACTIVATED CLOTTING TIME
Activated Clotting Time: 250 seconds
Activated Clotting Time: 255 seconds
Activated Clotting Time: 193 seconds
Activated Clotting Time: 209 seconds
Activated Clotting Time: 173 seconds

## 2016-02-28 SURGERY — PERIPHERAL VASCULAR BALLOON ANGIOPLASTY
Anesthesia: LOCAL | Laterality: Left

## 2016-02-28 MED ORDER — INSULIN GLARGINE 100 UNIT/ML ~~LOC~~ SOLN
25.0000 [IU] | Freq: Every day | SUBCUTANEOUS | Status: DC
Start: 2016-02-28 — End: 2016-02-29
  Filled 2016-02-28 (×2): qty 0.25

## 2016-02-28 MED ORDER — IODIXANOL 320 MG/ML IV SOLN
INTRAVENOUS | Status: DC | PRN
  Administered 2016-02-28: 35 mL via INTRA_ARTERIAL

## 2016-02-28 MED ORDER — MIDAZOLAM HCL 2 MG/2ML IJ SOLN
INTRAMUSCULAR | Status: AC
  Filled 2016-02-28: qty 2

## 2016-02-28 MED ORDER — FUROSEMIDE 20 MG PO TABS
20.0000 mg | ORAL_TABLET | Freq: Every day | ORAL | Status: DC
  Administered 2016-02-28: 22:00:00 20 mg via ORAL
  Filled 2016-02-28: qty 1

## 2016-02-28 MED ORDER — ANGIOPLASTY BOOK
Freq: Once | Status: AC
  Administered 2016-02-28: 22:00:00
  Filled 2016-02-28: qty 1

## 2016-02-28 MED ORDER — COLLAGENASE 250 UNIT/GM EX OINT
1.0000 "application " | TOPICAL_OINTMENT | Freq: Every day | CUTANEOUS | Status: DC
  Filled 2016-02-28: qty 30

## 2016-02-28 MED ORDER — ONDANSETRON HCL 4 MG/2ML IJ SOLN: 4.0000 mg | Freq: Four times a day (QID) | INTRAMUSCULAR | Status: DC | PRN

## 2016-02-28 MED ORDER — CLOBETASOL PROPIONATE 0.05 % EX SOLN: 1.0000 "application " | Freq: Every day | CUTANEOUS | Status: DC

## 2016-02-28 MED ORDER — SODIUM CHLORIDE 0.9% FLUSH: 3.0000 mL | Freq: Two times a day (BID) | INTRAVENOUS | Status: DC

## 2016-02-28 MED ORDER — CEFAZOLIN SODIUM-DEXTROSE 2-3 GM-% IV SOLR
INTRAVENOUS | Status: DC | PRN
  Administered 2016-02-28: 2 g via INTRAVENOUS

## 2016-02-28 MED ORDER — CLOPIDOGREL BISULFATE 300 MG PO TABS
ORAL_TABLET | ORAL | Status: DC | PRN
  Administered 2016-02-28: 300 mg via ORAL

## 2016-02-28 MED ORDER — LIDOCAINE HCL (PF) 1 % IJ SOLN
INTRAMUSCULAR | Status: DC | PRN
  Administered 2016-02-28: 16 mL via INTRADERMAL

## 2016-02-28 MED ORDER — SODIUM CHLORIDE 0.9 % IV SOLN: 250.0000 mL | INTRAVENOUS | Status: DC | PRN

## 2016-02-28 MED ORDER — HEPARIN SODIUM (PORCINE) 1000 UNIT/ML IJ SOLN
INTRAMUSCULAR | Status: AC
  Filled 2016-02-28: qty 1

## 2016-02-28 MED ORDER — LIDOCAINE HCL (PF) 1 % IJ SOLN
INTRAMUSCULAR | Status: AC
  Filled 2016-02-28: qty 30

## 2016-02-28 MED ORDER — HEPARIN (PORCINE) IN NACL 2-0.9 UNIT/ML-% IJ SOLN
INTRAMUSCULAR | Status: AC
  Filled 2016-02-28: qty 1000

## 2016-02-28 MED ORDER — CEFAZOLIN SODIUM-DEXTROSE 2-3 GM-% IV SOLR
INTRAVENOUS | Status: AC
  Filled 2016-02-28: qty 50

## 2016-02-28 MED ORDER — CLOPIDOGREL BISULFATE 75 MG PO TABS: 75.0000 mg | ORAL_TABLET | Freq: Every day | ORAL | Status: DC

## 2016-02-28 MED ORDER — SODIUM CHLORIDE 0.9 % IV SOLN
INTRAVENOUS | Status: DC
  Administered 2016-02-28: 08:00:00 via INTRAVENOUS

## 2016-02-28 MED ORDER — ATORVASTATIN CALCIUM 10 MG PO TABS: 10.0000 mg | ORAL_TABLET | Freq: Every day | ORAL | Status: DC

## 2016-02-28 MED ORDER — AMLODIPINE BESYLATE 5 MG PO TABS: 5.0000 mg | ORAL_TABLET | Freq: Every day | ORAL | Status: DC

## 2016-02-28 MED ORDER — FENTANYL CITRATE (PF) 100 MCG/2ML IJ SOLN
INTRAMUSCULAR | Status: DC | PRN
  Administered 2016-02-28 (×2): 25 ug via INTRAVENOUS

## 2016-02-28 MED ORDER — DEXTROSE 50 % IV SOLN
25.0000 mL | INTRAVENOUS | Status: DC | PRN
  Administered 2016-02-28: 25 mL via INTRAVENOUS

## 2016-02-28 MED ORDER — FENTANYL CITRATE (PF) 100 MCG/2ML IJ SOLN
INTRAMUSCULAR | Status: AC
  Filled 2016-02-28: qty 2

## 2016-02-28 MED ORDER — MIDAZOLAM HCL 2 MG/2ML IJ SOLN
INTRAMUSCULAR | Status: DC | PRN
  Administered 2016-02-28 (×2): 1 mg via INTRAVENOUS

## 2016-02-28 MED ORDER — GLIPIZIDE ER 5 MG PO TB24
5.0000 mg | ORAL_TABLET | Freq: Every day | ORAL | Status: DC
  Administered 2016-02-29: 5 mg via ORAL
  Filled 2016-02-28: qty 1

## 2016-02-28 MED ORDER — SODIUM CHLORIDE 0.9% FLUSH: 3.0000 mL | INTRAVENOUS | Status: DC | PRN

## 2016-02-28 MED ORDER — CLOPIDOGREL BISULFATE 300 MG PO TABS
ORAL_TABLET | ORAL | Status: AC
  Filled 2016-02-28: qty 1

## 2016-02-28 MED ORDER — SODIUM CHLORIDE 0.9% FLUSH
3.0000 mL | INTRAVENOUS | Status: DC | PRN
Start: 2016-02-28 — End: 2016-02-28

## 2016-02-28 MED ORDER — ACETAMINOPHEN 500 MG PO TABS: 1000.0000 mg | ORAL_TABLET | Freq: Three times a day (TID) | ORAL | Status: DC | PRN

## 2016-02-28 MED ORDER — HEPARIN (PORCINE) IN NACL 2-0.9 UNIT/ML-% IJ SOLN
INTRAMUSCULAR | Status: DC | PRN
  Administered 2016-02-28: 14:00:00

## 2016-02-28 MED ORDER — HEPARIN SODIUM (PORCINE) 1000 UNIT/ML IJ SOLN
INTRAMUSCULAR | Status: DC | PRN
  Administered 2016-02-28 (×2): 2000 [IU] via INTRAVENOUS
  Administered 2016-02-28: 8000 [IU] via INTRAVENOUS

## 2016-02-28 MED ORDER — SODIUM CHLORIDE 0.9 % IV SOLN
INTRAVENOUS | Status: AC
  Administered 2016-02-28: 18:00:00 via INTRAVENOUS

## 2016-02-28 MED ORDER — DEXTROSE 50 % IV SOLN
25.0000 mL | Freq: Once | INTRAVENOUS | Status: AC
  Administered 2016-02-28: 17:00:00 25 mL via INTRAVENOUS
  Filled 2016-02-28: qty 50

## 2016-02-28 MED ORDER — DEXTROSE 50 % IV SOLN
INTRAVENOUS | Status: AC
  Administered 2016-02-28: 25 mL via INTRAVENOUS
  Filled 2016-02-28: qty 50

## 2016-02-28 SURGICAL SUPPLY — 15 items
BALLN ANGIOSCULPT 3.0X100 (BALLOONS) ×1 IMPLANT
BALLN COYOTE OTW 2X120X150 (BALLOONS) ×2
BALLOON COYOTE OTW 2X120X150 (BALLOONS) IMPLANT
CATH CROSS OVER TEMPO 5F (CATHETERS) ×1 IMPLANT
CATH QUICKCROSS .018X135CM (MICROCATHETER) ×1 IMPLANT
KIT ENCORE 26 ADVANTAGE (KITS) ×1 IMPLANT
KIT PV (KITS) ×2 IMPLANT
SHEATH PINNACLE 5F 10CM (SHEATH) ×1 IMPLANT
SHEATH PINNACLE ST 6F 45CM (SHEATH) ×1 IMPLANT
TAPE RADIOPAQUE TURBO (MISCELLANEOUS) ×1 IMPLANT
TRANSDUCER W/STOPCOCK (MISCELLANEOUS) ×2 IMPLANT
TRAY PV CATH (CUSTOM PROCEDURE TRAY) ×2 IMPLANT
WIRE ASAHI FIELDER XT 300CM (WIRE) ×1 IMPLANT
WIRE HITORQ VERSACORE ST 145CM (WIRE) ×1 IMPLANT
WIRE SPARTACORE .014X300CM (WIRE) ×1 IMPLANT

## 2016-02-28 NOTE — Interval H&P Note (Signed)
History and Physical Interval Note:  02/28/2016 12:49 PM  [name] Small  has presented today for surgery, with the diagnosis of pvd  The various methods of treatment have been discussed with the patient and family. After consideration of risks, benefits and other options for treatment, the patient has consented to  Procedure(s): Peripheral Vascular Balloon Angioplasty (Left) as a surgical intervention .  The patient's history has been reviewed, patient examined, no change in status, stable for surgery.  I have reviewed the patient's chart and labs.  Questions were answered to the patient's satisfaction.     [name]

## 2016-02-28 NOTE — Progress Notes (Addendum)
Hypoglycemic Event  CBG: 68 @ 0845  Treatment: D50 IV 25 mL  Symptoms: None  Follow-up CBG: [id number] CBG Result:124  Possible Reasons for Event: Inadequate meal intake     N[place] Guard Tysinger

## 2016-02-28 NOTE — Progress Notes (Addendum)
Site area: right groin  Site Prior to Removal:  Level 0  Pressure Applied For 20 MINUTES    Minutes Beginning at 1740  Manual:   Yes.    Patient Status During Pull:  stable  Post Pull Groin Site:  Level 0  Post Pull Instructions Given:  Yes.    Post Pull Pulses Present:  Yes.    Dressing Applied:  Yes.    Comments:  Tolerated sheath pull well. No complications

## 2016-02-28 NOTE — H&P (View-Only) (Signed)
Cardiology Office Note   Date:  02/23/2016   ID:  [name] Small, DOB [birth year], MRN [id number]  PCP:  [name], MD  Cardiologist:   Dr. Meda Coffee  Chief Complaint  Patient presents with  . other    Discuss abd aortogram results. Meds reviewed verbally with pt.      History of Present Illness: [name] Small is a 82 y.o. male who presents for a follow up visit regarding peripheral arterial disease. The patient has known history of mild nonobstructive coronary artery disease,Chronic kidney disease, hypertension and diabetes. He quit smoking more than 40 years ago. He had bladder cancer in 2012 treated with chemotherapy.  He had noninvasive vascular evaluation in March of this year which showed an ABI of 1.0 on the right and 0.71 on the left. There was mild nonobstructive bilateral SFA disease. There was one-vessel runoff bilaterally below the knee. His symptoms did not improve with cilostazol or gabapentin.   He developed an open wound on the left medial part of first metatarsal space.  I proceeded with left lower extremity angiography which showed an occluded distal left popliteal artery with reconstitution via extensive collaterals to the proximal to mid peroneal artery which was the only patent vessel below the knee. Given the presence of extensive collaterals and the fact that the patient has advanced chronic kidney disease, I did not proceed with endovascular intervention. The patient has been attending at the wound clinic and there has been no improvement in the wound. I reviewed the angiogram with him today.   Past Medical History  Diagnosis Date  . BENIGN PROSTATIC HYPERTROPHY 05/21/2007  . DIABETES MELLITUS, TYPE II 05/21/2007  . DIVERTICULOSIS, COLON 05/21/2007    pt denies  . HYPERTENSION 05/21/2007  . PSORIASIS, SCALP 10/25/2008  . Cellulitis of left foot     Hospitalized in 2006  . Gross hematuria     pt denies  . Psoriasis ELBOWS  . Fatigue AGE-RELATED  .  Arthritis   . CAD (coronary artery disease) CARDIOLOGIST- DR WALL - VISIT IN JUNE 2012 FOR CATH    nonobstructive by cath 6/12:  mid LAD 30%, proximal obtuse marginal-2 30%, proximal RCA 20%, mid RCA 30-40%.  He had normal cardiac output and mildly elevated filling pressures but no significant pulmonary hypertension;   Echocardiogram in May 2012 demonstrated EF 50-55% and left atrial enlargement   . Impaired hearing BILATERAL HEARING AIDS  . Bladder cancer ([place])     2013  . Chronic kidney disease (CKD), stage III (moderate) 12/04/2007    FOLLOWED BY DR PATEL  . Back pain     s/p lumbar injection 2014    Past Surgical History  Procedure Laterality Date  . Vasectomy  15 YRS AGO  W/ ANES.  Marland Kitchen Penile prosthesis implant  15 YRS AGO  . Cardiovascular stress test  2007  . Cardiac catheterization  04-24-11/  DR Luceal Hollibaugh    MILD NONOBSTRUCTIVE CAD, NORMA CARDIAC OUTPUT  . Appendectomy  AGE 26  . Tonsillectomy and adenoidectomy  AGE 73 YRS AGO  . Cataract extraction w/ intraocular lens  implant, bilateral    . Incision and drainage foot  LEFT FOOT DUE TO INFECTION FROM  NAIL PUNCTURE INJURY  . Cystoscopy w/ retrogrades  10/30/2011    Procedure: CYSTOSCOPY WITH RETROGRADE PYELOGRAM;  Surgeon: Molli Hazard, MD;  Location: [facility];  Service: Urology;  Laterality: Bilateral;  CYSTOSCOPY POSS TURBT BILATERAL RETROGRADE PYLEOGRAM POSS BILATERAL URETEROSCOPY WITH  BIOPSY LASER ABLATION OF LESION POSS BILATERAL URETERAL STENT PLACEMENT   CARM FLEXIBLE URETERAL SCOPE DIGITAL URETERAL SCOPE PK GYRUS HO  . Transurethral resection of bladder tumor  10/30/2011    Procedure: TRANSURETHRAL RESECTION OF BLADDER TUMOR (TURBT);  Surgeon: Molli Hazard, MD;  Location: [facility];  Service: Urology;  Laterality: N/A;  . Cystoscopy  12/25/2011    Procedure: CYSTOSCOPY;  Surgeon: Molli Hazard, MD;  Location: [facility];  Service:  Urology;  Laterality: N/A;  needs intubation and to be paralyzed   . Transurethral resection of bladder tumor  12/25/2011    Procedure: TRANSURETHRAL RESECTION OF BLADDER TUMOR (TURBT);  Surgeon: Molli Hazard, MD;  Location: [facility];  Service: Urology;  Laterality: N/A;  need long gyrus instruments Dr states he has spoken w/ Nelwyn Salisbury regarding this    . Cystoscopy w/ retrogrades  11/30/2012    Procedure: CYSTOSCOPY WITH RETROGRADE PYELOGRAM;  Surgeon: Molli Hazard, MD;  Location: [facility];  Service: Urology;  Laterality: Bilateral;  Flexible cystoscopy.  . Cystoscopy with biopsy  11/30/2012    Procedure: CYSTOSCOPY WITH BIOPSY;  Surgeon: Molli Hazard, MD;  Location: [facility];  Service: Urology;  Laterality: N/A;  . Peripheral vascular catheterization N/A 02/14/2016    Procedure: Abdominal Aortogram w/Lower Extremity;  Surgeon: Wellington Hampshire, MD;  Location: [facility];  Service: Cardiovascular;  Laterality: N/A;     Current Outpatient Prescriptions  Medication Sig Dispense Refill  . acetaminophen (TYLENOL) 500 MG tablet Take 1,000 mg by mouth every 8 (eight) hours as needed for mild pain, moderate pain or headache.    Marland Kitchen amLODipine (NORVASC) 5 MG tablet Take 1 tablet (5 mg total) by mouth daily.    Marland Kitchen atorvastatin (LIPITOR) 10 MG tablet Take 1 tablet (10 mg total) by mouth daily. 90 tablet 3  . budesonide-formoterol (SYMBICORT) 160-4.5 MCG/ACT inhaler Inhale 2 puffs into the lungs 2 (two) times daily as needed.    . clobetasol (TEMOVATE) 0.05 % external solution Apply 1 application topically daily.    . clopidogrel (PLAVIX) 75 MG tablet Take 1 tablet by mouth  daily 90 tablet 1  . collagenase (SANTYL) ointment Apply 1 application topically daily.    . furosemide (LASIX) 40 MG tablet TAKE 1 TABLET BY MOUTH  EVERY MONDAY, WEDNESDAY,  AND FRIDAY 45 tablet 1  . gabapentin (NEURONTIN) 100 MG capsule Take 1 capsule  (100 mg total) by mouth 3 (three) times daily. 270 capsule 3  . glipiZIDE (GLUCOTROL XL) 5 MG 24 hr tablet Take 1 tablet by mouth  daily with breakfast 90 tablet 1  . insulin glargine (LANTUS) 100 UNIT/ML injection Inject 25 Units into the skin daily at 3 pm.     No current facility-administered medications for this visit.    Allergies:   Actos; Aspirin; Ace inhibitors; Gabapentin; Lyrica; Pravastatin; Bactrim; and Sulfa drugs cross reactors    Social History:  The patient  reports that he quit smoking about 47 years ago. His smoking use included Cigarettes. He has a 44 pack-year smoking history. He has never used smokeless tobacco. He reports that he does not drink alcohol or use illicit drugs.   Family History:  The patient's family history includes Diabetes in his mother; Heart disease in his brother, brother, brother, brother, brother, and brother; Lung cancer in his brother.    ROS:  Please see the history of present illness.   Otherwise, review  of systems are positive for none.   All other systems are reviewed and negative.    PHYSICAL EXAM: VS:  BP 104/60 mmHg  Pulse 67  Ht 6\' 2"  (1.88 m)  Wt 215 lb (97.523 kg)  BMI 27.59 kg/m2 , BMI Body mass index is 27.59 kg/(m^2). GEN: Well nourished, well developed, in no acute distress HEENT: normal Neck: no JVD, carotid bruits, or masses Cardiac: RRR; no murmurs, rubs, or gallops,no edema  Respiratory:  clear to auscultation bilaterally, normal work of breathing GI: soft, nontender, nondistended, + BS MS: no deformity or atrophy Skin: warm and dry, no rash Neuro:  Strength and sensation are intact Psych: euthymic mood, full affect Vascular: Femoral pulses are normal bilaterally. Distal pulses are not palpable with cold feet. He has a 1 x 1 cm necrotic ulcer on the left medial metatarsal space  EKG:  EKG is ordered today. It showed normal sinus rhythm with no significant ST or T wave changes.    Recent Labs: 11/28/2015: ALT  12 02/12/2016: Platelets 245 02/14/2016: BUN 30*; Creatinine, Ser 1.88*; Potassium 4.5; Sodium 142    Lipid Panel    Component Value Date/Time   CHOL 116* 11/28/2015 0933   TRIG 81 11/28/2015 0933   HDL 43 11/28/2015 0933   CHOLHDL 2.7 11/28/2015 0933   VLDL 16 11/28/2015 0933   LDLCALC 57 11/28/2015 0933   LDLDIRECT 147.6 04/27/2009 1012      Wt Readings from Last 3 Encounters:  02/23/16 215 lb (97.523 kg)  02/19/16 216 lb (97.977 kg)  02/19/16 216 lb (97.977 kg)       ASSESSMENT AND PLAN:   1. Peripheral arterial disease:  He Continues to have nonhealed wound on the left foot.  There has been no improvement in spite of aggressive wound care. Thus, I think we have to proceed with attempted revascularization of his left popliteal artery into the peroneal artery to establish in-line flow. If not successful, the patient might need bypass. I discussed the procedure in detail today as well as risks and benefits. Again I would bring him here early for hydration given his chronic kidney disease with a low threshold for overnight observation. Continue treatment with Plavix.  2. Hyperlipidemia: Continue treatment with atorvastatin with a target LDL of less than 70.   3. Chronic kidney disease: High risk for contrast-induced nephropathy. Follow the precautions as outlined above.      Disposition:   FU with me in 1 months  Signed,  [name], MD  02/23/2016 6:28 PM    [place]

## 2016-02-28 NOTE — Progress Notes (Signed)
Upon shift assessment right groin is noted to be oozing blood, hemodynamics unaffected, hematoma >3cm noted. Pressure held at site, and hematoma managed with manual pressure. S/p pressure hold site is noted to be level 1 hematoma

## 2016-02-29 ENCOUNTER — Encounter: Payer: Medicare Other | Admitting: Surgery

## 2016-02-29 DIAGNOSIS — N189 Chronic kidney disease, unspecified: Secondary | ICD-10-CM | POA: Insufficient documentation

## 2016-02-29 DIAGNOSIS — N183 Chronic kidney disease, stage 3 (moderate): Secondary | ICD-10-CM | POA: Diagnosis not present

## 2016-02-29 DIAGNOSIS — E785 Hyperlipidemia, unspecified: Secondary | ICD-10-CM | POA: Diagnosis not present

## 2016-02-29 DIAGNOSIS — Z794 Long term (current) use of insulin: Secondary | ICD-10-CM | POA: Diagnosis not present

## 2016-02-29 DIAGNOSIS — S301XXA Contusion of abdominal wall, initial encounter: Secondary | ICD-10-CM | POA: Insufficient documentation

## 2016-02-29 DIAGNOSIS — I2510 Atherosclerotic heart disease of native coronary artery without angina pectoris: Secondary | ICD-10-CM | POA: Diagnosis not present

## 2016-02-29 DIAGNOSIS — J449 Chronic obstructive pulmonary disease, unspecified: Secondary | ICD-10-CM | POA: Diagnosis not present

## 2016-02-29 DIAGNOSIS — L97529 Non-pressure chronic ulcer of other part of left foot with unspecified severity: Secondary | ICD-10-CM | POA: Diagnosis not present

## 2016-02-29 DIAGNOSIS — Z7902 Long term (current) use of antithrombotics/antiplatelets: Secondary | ICD-10-CM | POA: Diagnosis not present

## 2016-02-29 DIAGNOSIS — Z79899 Other long term (current) drug therapy: Secondary | ICD-10-CM | POA: Diagnosis not present

## 2016-02-29 DIAGNOSIS — L409 Psoriasis, unspecified: Secondary | ICD-10-CM | POA: Diagnosis not present

## 2016-02-29 DIAGNOSIS — I739 Peripheral vascular disease, unspecified: Secondary | ICD-10-CM | POA: Diagnosis not present

## 2016-02-29 DIAGNOSIS — Z87891 Personal history of nicotine dependence: Secondary | ICD-10-CM | POA: Diagnosis not present

## 2016-02-29 DIAGNOSIS — L7632 Postprocedural hematoma of skin and subcutaneous tissue following other procedure: Secondary | ICD-10-CM | POA: Diagnosis not present

## 2016-02-29 DIAGNOSIS — L97522 Non-pressure chronic ulcer of other part of left foot with fat layer exposed: Secondary | ICD-10-CM | POA: Diagnosis not present

## 2016-02-29 DIAGNOSIS — L97521 Non-pressure chronic ulcer of other part of left foot limited to breakdown of skin: Secondary | ICD-10-CM | POA: Diagnosis not present

## 2016-02-29 DIAGNOSIS — I7092 Chronic total occlusion of artery of the extremities: Secondary | ICD-10-CM | POA: Diagnosis not present

## 2016-02-29 DIAGNOSIS — I129 Hypertensive chronic kidney disease with stage 1 through stage 4 chronic kidney disease, or unspecified chronic kidney disease: Secondary | ICD-10-CM | POA: Diagnosis not present

## 2016-02-29 DIAGNOSIS — Z992 Dependence on renal dialysis: Secondary | ICD-10-CM | POA: Diagnosis not present

## 2016-02-29 DIAGNOSIS — I998 Other disorder of circulatory system: Secondary | ICD-10-CM | POA: Diagnosis not present

## 2016-02-29 DIAGNOSIS — E1140 Type 2 diabetes mellitus with diabetic neuropathy, unspecified: Secondary | ICD-10-CM | POA: Diagnosis not present

## 2016-02-29 DIAGNOSIS — Z9221 Personal history of antineoplastic chemotherapy: Secondary | ICD-10-CM | POA: Diagnosis not present

## 2016-02-29 DIAGNOSIS — E11621 Type 2 diabetes mellitus with foot ulcer: Secondary | ICD-10-CM | POA: Diagnosis not present

## 2016-02-29 DIAGNOSIS — E1122 Type 2 diabetes mellitus with diabetic chronic kidney disease: Secondary | ICD-10-CM | POA: Diagnosis not present

## 2016-02-29 DIAGNOSIS — I70245 Atherosclerosis of native arteries of left leg with ulceration of other part of foot: Secondary | ICD-10-CM | POA: Diagnosis not present

## 2016-02-29 DIAGNOSIS — Z8551 Personal history of malignant neoplasm of bladder: Secondary | ICD-10-CM | POA: Diagnosis not present

## 2016-02-29 LAB — BASIC METABOLIC PANEL
Anion gap: 10 (ref 5–15)
BUN: 28 mg/dL — ABNORMAL HIGH (ref 6–20)
CO2: 28 mmol/L (ref 22–32)
Calcium: 8.1 mg/dL — ABNORMAL LOW (ref 8.9–10.3)
Chloride: 105 mmol/L (ref 101–111)
Creatinine, Ser: 1.65 mg/dL — ABNORMAL HIGH (ref 0.61–1.24)
GFR calc Af Amer: 42 mL/min — ABNORMAL LOW (ref >60)
GFR calc non Af Amer: 36 mL/min — ABNORMAL LOW (ref >60)
Glucose, Bld: 127 mg/dL — ABNORMAL HIGH (ref 65–99)
Potassium: 4.2 mmol/L (ref 3.5–5.1)
Sodium: 143 mmol/L (ref 135–145)

## 2016-02-29 LAB — CBC
HCT: 41.7 % (ref 39.0–52.0)
Hemoglobin: 13.4 g/dL (ref 13.0–17.0)
MCH: 32 pg (ref 26.0–34.0)
MCHC: 32.1 g/dL (ref 30.0–36.0)
MCV: 99.5 fL (ref 78.0–100.0)
Platelets: 205 10*3/uL (ref 150–400)
RBC: 4.19 MIL/uL — ABNORMAL LOW (ref 4.22–5.81)
RDW: 12.8 % (ref 11.5–15.5)
WBC: 8.8 10*3/uL (ref 4.0–10.5)

## 2016-02-29 LAB — GLUCOSE, CAPILLARY: Glucose-Capillary: 130 mg/dL — ABNORMAL HIGH (ref 65–99)

## 2016-02-29 NOTE — Discharge Summary (Signed)
**Note [name]-Identified via Obfuscation** Discharge Summary    Patient ID: HOYET VEALE,  MRN: [id number], DOB/AGE: [birth year] 80 y.o.  Admit date: 03-20-2016 Discharge date: 02/29/2016  Primary Care Provider: [name] Primary Cardiologist: Dr. Meda Coffee  PV: Dr. Fletcher Anon  Discharge Diagnoses    Principal Problem:   Critical lower limb ischemia Active Problems:   Groin hematoma   Chronic renal failure   HTN   DM   Allergies Allergies  Allergen Reactions  . Actos [Pioglitazone Hydrochloride] Other (See Comments)    Held 2012 bladder cancer  . Aspirin Other (See Comments)    Held 2012 due to hematuria and bruising.   . Ace Inhibitors Cough  . Gabapentin     Upset stomach and diarrhea - tolerates low doses  . Lyrica [Pregabalin] Other (See Comments)    swelling  . Pravastatin Other (See Comments)    Swelling of feet  . Bactrim Rash and Other (See Comments)    Rash, presumed allergy  . Sulfa Drugs Cross Reactors Rash    Diagnostic Studies/Procedures    Procedures 03/20/16   Peripheral Vascular Balloon Angioplasty    Conclusion    Successful angioplasty to the left popliteal and left peroneal arteries for total occlusion with establishment of in-line flow to the foot. Only 35 mL of contrast was used.  Recommendations: Observe overnight with gentle hydration. Likely discharged tomorrow. Continue wound care.           History of Present Illness     [name] Small is a 82 y.o. male who seen by Dr. Fletcher Anon regarding peripheral arterial disease. The patient has known history of mild nonobstructive coronary artery disease,Chronic kidney disease, hypertension and diabetes. He quit smoking more than 40 years ago. He had bladder cancer in 2012 treated with chemotherapy.  He had noninvasive vascular evaluation in 3/2017showed an ABI of 1.0 on the right and 0.71 on the left. There was mild nonobstructive bilateral SFA disease. There was one-vessel runoff bilaterally below the knee. is symptoms  did not improve with cilostazol or gabapentin. He developed an open wound on the left medial part of first metatarsal space. Left lower extremity angiography which showed an occluded distal left popliteal artery with reconstitution via extensive collaterals to the proximal to mid peroneal artery which was the only patent vessel below the knee. Given the presence of extensive collaterals and the fact that the patient has advanced chronic kidney disease, I did not proceed with endovascular intervention. he patient has been attending at the wound clinic and there has been no improvement in the wound.    Hospital Course     Consultants: None  The patient presented for schedule procedure. s/p Successful angioplasty to the left popliteal and left peroneal arteries for total occlusion with establishment of in-line flow to the foot. He has ambulated without claudication, chest pain or shortness of breath. The patient has level 1 hematoma and ecchymosis which improved.  No pain, bruit,  or tenderness. Hemoglobin was stable. Renal function was at baseline post procedure. No change to home regimens.   The patient has been seen by Dr. Oval Linsey  today and deemed ready for discharge home. All follow-up appointments have been scheduled. Discharge medications are listed below.     Discharge Vitals Blood pressure 102/41, pulse 67, temperature 97.7 F (36.5 C), temperature source Oral, resp. rate 15, height 6\' 2"  (1.88 m), weight 209 lb 14.1 oz (95.2 kg), SpO2 93 %.  Filed Weights   2016-03-20 0721 02/29/16 0325  Weight: 210 lb (95.255 kg) 209 lb 14.1 oz (95.2 kg)    Labs & Radiologic Studies     CBC  Recent Labs  02/29/16 1014  WBC 8.8  HGB 13.4  HCT 41.7  MCV 99.5  PLT 99991111   Basic Metabolic Panel  Recent Labs  02/28/16 0733 02/29/16 0433  NA 142 143  K 4.2 4.2  CL 101 105  CO2 29 28  GLUCOSE 83 127*  BUN 34* 28*  CREATININE 1.77* 1.65*  CALCIUM 8.3* 8.1*   Liver Function Tests No  results for input(s): AST, ALT, ALKPHOS, BILITOT, PROT, ALBUMIN in the last 72 hours. No results for input(s): LIPASE, AMYLASE in the last 72 hours. Cardiac Enzymes No results for input(s): CKTOTAL, CKMB, CKMBINDEX, TROPONINI in the last 72 hours. BNP Invalid input(s): POCBNP D-Dimer No results for input(s): DDIMER in the last 72 hours. Hemoglobin A1C No results for input(s): HGBA1C in the last 72 hours. Fasting Lipid Panel No results for input(s): CHOL, HDL, LDLCALC, TRIG, CHOLHDL, LDLDIRECT in the last 72 hours. Thyroid Function Tests No results for input(s): TSH, T4TOTAL, T3FREE, THYROIDAB in the last 72 hours.  Invalid input(s): FREET3  Dg Foot Complete Left  02/08/2016  CLINICAL DATA:  Nonhealing wound to foot. EXAM: LEFT FOOT - COMPLETE 3+ VIEW COMPARISON:  January 06, 2005. FINDINGS: There is no evidence of fracture or dislocation. There is no evidence of arthropathy or other focal bone abnormality. No lytic destruction is seen. Vascular calcifications are noted. IMPRESSION: No definite evidence of osteomyelitis or fracture. Electronically Signed   By: Marijo Conception, M.D.   On: 02/08/2016 13:07    Disposition   Pt is being discharged home today in good condition.  Follow-up Plans & Appointments    Follow-up Information    Follow up with [facility]. Go on 03/04/2016.   Specialty:  Cardiology   Why:  @9 :40 am for lab work   Sport and exercise psychologist information:   [address], [place] [phone]      Follow up with [name], MD. Go on 03/21/2016.   Specialty:  Cardiology   Why:  @2 :30 pm for post hospital visit   Contact information:   [place] [address] [phone]      Discharge Instructions    Call MD for:  redness, tenderness, or signs of infection (pain, swelling, redness, odor or green/yellow discharge around incision site)    Complete by:  As directed      Diet - low  sodium heart healthy    Complete by:  As directed      Discharge instructions    Complete by:  As directed   No driving for 48 hours. No lifting over 5 lbs for 1 week. No sexual activity for 1 week.  Keep procedure site clean & dry. If you notice increased pain, swelling, bleeding or pus, call/return!  You may shower, but no soaking baths/hot tubs/pools for 1 week.     Increase activity slowly    Complete by:  As directed            Discharge Medications   Discharge Medication List as of 02/29/2016 10:54 AM    CONTINUE these medications which have NOT CHANGED   Details  acetaminophen (TYLENOL) 500 MG tablet Take 1,000 mg by mouth every 8 (eight) hours as needed for mild pain, moderate pain or headache., Until Discontinued, Historical Med  amLODipine (NORVASC) 5 MG tablet Take 1 tablet (5 mg total) by mouth daily., Starting 11/10/2015, Until Discontinued, No Print    atorvastatin (LIPITOR) 10 MG tablet Take 1 tablet (10 mg total) by mouth daily., Starting 02/23/2016, Until Discontinued, Normal    budesonide-formoterol (SYMBICORT) 160-4.5 MCG/ACT inhaler Inhale 2 puffs into the lungs 2 (two) times daily as needed., Starting 01/21/2014, Until Discontinued, Historical Med    clobetasol (TEMOVATE) 0.05 % external solution Apply 1 application topically daily., Until Discontinued, Historical Med    clopidogrel (PLAVIX) 75 MG tablet Take 1 tablet by mouth  daily, Normal    collagenase (SANTYL) ointment Apply 1 application topically daily., Until Discontinued, Historical Med    furosemide (LASIX) 40 MG tablet TAKE 1 TABLET BY MOUTH  EVERY MONDAY, WEDNESDAY,  AND FRIDAY, Normal    glipiZIDE (GLUCOTROL XL) 5 MG 24 hr tablet Take 1 tablet by mouth  daily with breakfast, Normal    insulin glargine (LANTUS) 100 UNIT/ML injection Inject 25 Units into the skin daily at 3 pm., Starting 01/21/2014, Until Discontinued, Historical Med          Outstanding Labs/Studies   As previously  schedule  Duration of Discharge Encounter   Greater than 30 minutes including physician time.  Signed, Yareli Carthen PA-C 02/29/2016, 2:48 PM

## 2016-02-29 NOTE — Progress Notes (Signed)
Patient Name: [name] Small Date of Encounter: 02/29/2016   SUBJECTIVE  Feeling well. No chest pain, sob or palpitations.   CURRENT MEDS . amLODipine  5 mg Oral Daily  . atorvastatin  10 mg Oral Daily  . clopidogrel  75 mg Oral Daily  . collagenase  1 application Topical Daily  . furosemide  20 mg Oral Daily  . glipiZIDE  5 mg Oral Q breakfast  . insulin glargine  25 Units Subcutaneous QHS  . sodium chloride flush  3 mL Intravenous Q12H    OBJECTIVE  Filed Vitals:   02/28/16 2000 02/28/16 2001 02/29/16 0325 02/29/16 0741  BP: 155/61 155/61 127/57 102/41  Pulse: 67 68 67   Temp:  98.1 F (36.7 C) 98 F (36.7 C) 97.7 F (36.5 C)  TempSrc:  Oral Oral Oral  Resp: 26 18 15    Height:      Weight:   209 lb 14.1 oz (95.2 kg)   SpO2: 94% 95% 93%     Intake/Output Summary (Last 24 hours) at 02/29/16 0840 Last data filed at 02/29/16 0742  Gross per 24 hour  Intake   1840 ml  Output    900 ml  Net    940 ml   Filed Weights   02/28/16 0721 02/29/16 0325  Weight: 210 lb (95.255 kg) 209 lb 14.1 oz (95.2 kg)    PHYSICAL EXAM  General: Pleasant, NAD. Neuro: Alert and oriented X 3. Moves all extremities spontaneously. Psych: Normal affect. HEENT:  Normal  Neck: Supple without bruits or JVD. Lungs:  Resp regular and unlabored, CTA. Heart: RRR no s3, s4, or murmurs. Abdomen: Soft, non-tender, non-distended, BS + x 4.  Extremities: No clubbing, cyanosis or edema. DP/PT/Radials 2+ and equal bilaterally. R groin cath site has mild bruise, no hematoma.   Accessory Clinical Findings  CBC No results for input(s): WBC, NEUTROABS, HGB, HCT, MCV, PLT in the last 72 hours. Basic Metabolic Panel  Recent Labs  02/28/16 0733 02/29/16 0433  NA 142 143  K 4.2 4.2  CL 101 105  CO2 29 28  GLUCOSE 83 127*  BUN 34* 28*  CREATININE 1.77* 1.65*  CALCIUM 8.3* 8.1*   Liver Function Tests No results for input(s): AST, ALT, ALKPHOS, BILITOT, PROT, ALBUMIN in the last 72  hours. No results for input(s): LIPASE, AMYLASE in the last 72 hours. Cardiac Enzymes No results for input(s): CKTOTAL, CKMB, CKMBINDEX, TROPONINI in the last 72 hours. BNP Invalid input(s): POCBNP D-Dimer No results for input(s): DDIMER in the last 72 hours. Hemoglobin A1C No results for input(s): HGBA1C in the last 72 hours. Fasting Lipid Panel No results for input(s): CHOL, HDL, LDLCALC, TRIG, CHOLHDL, LDLDIRECT in the last 72 hours. Thyroid Function Tests No results for input(s): TSH, T4TOTAL, T3FREE, THYROIDAB in the last 72 hours.  Invalid input(s): FREET3  TELE  Sinus rhythm    Procedures  02/28/16   Peripheral Vascular Balloon Angioplasty    Conclusion    Successful angioplasty to the left popliteal and left peroneal arteries for total occlusion with establishment of in-line flow to the foot. Only 35 mL of contrast was used.  Recommendations: Observe overnight with gentle hydration. Likely discharged tomorrow. Continue wound care.      Radiology/Studies  Dg Foot Complete Left  02/08/2016  CLINICAL DATA:  Nonhealing wound to foot. EXAM: LEFT FOOT - COMPLETE 3+ VIEW COMPARISON:  January 06, 2005. FINDINGS: There is no evidence of fracture or dislocation. There is no evidence of  arthropathy or other focal bone abnormality. No lytic destruction is seen. Vascular calcifications are noted. IMPRESSION: No definite evidence of osteomyelitis or fracture. Electronically Signed   By: Marijo Conception, M.D.   On: 02/08/2016 13:07    ASSESSMENT AND PLAN  1. PAD/Critical lower limb ischemia  - s/p Successful angioplasty to the left popliteal and left peroneal arteries for total occlusion with establishment of in-line flow to the foot. The patient has level 1 hematoma which resolved this morning. No pain or tenderness.   2. CKD, stage III - Scr improved from 1.77 to 1.65. Seems at baseline.  Dipso: ambulate and discharge later today. The patient has appointment at  2:30pm today.    Jarrett Soho PA-C Pager [phone]

## 2016-02-29 NOTE — Progress Notes (Signed)
PA in to see pt, ok for d/c after walk.  Pt walked in hall approx 100 ft with quick steady gait barely using his cane.  Denies complaints, back to room with wife.  Rt groin unchanged.

## 2016-02-29 NOTE — Progress Notes (Signed)
C/o pain at IV site; iv d/c per patient request and unable to flush. Patient refuses to have another IV. Pt educated and continues to refuse, also educated spouse

## 2016-02-29 NOTE — Progress Notes (Signed)
Up to bathroom with standby assist for first time since cath, voided and bm.  Steady gait, denies cp or sob, rt groin level 1 bruise but soft.  Eager to go home. Wife at bedside.

## 2016-02-29 NOTE — Progress Notes (Signed)
Right groin site remains level 1 hematoma, palpable mass- (softened and smaller that initial assessment).  No oozing from site since initial pressure hold. Will continue to monitor and document any changes. Dr. Jules Husbands during shift for notification of hematoma, intervention, and response. No orders were given at that time.

## 2016-02-29 NOTE — Progress Notes (Addendum)
KIMAHRI, CHEUVRONT ([id number]) Visit Report for 02/29/2016 Chief Complaint Document Details Patient Name: [name] Small, [name] Small. Date of Service: 02/29/2016 2:15 PM Medical Record Number: [id number] Patient Account Number: [id number] Date of Birth/Sex: 1928/04/17 (82 y.o. Male) Treating RN: Montey Hora Primary Care Physician: [name] Other Clinician: Referring Physician: [name] Treating Physician/Extender: Frann Rider in Treatment: 3 Information Obtained from: Patient Chief Complaint Patient presents to the wound care center today with an open arterial ulcer to the left medial forefoot and is also known to be a diabetic and has had these problems at least for 6 weeks Electronic Signature(s) Signed: 02/29/2016 3:56:28 PM By: Christin Fudge MD, FACS Entered By: Christin Fudge on 02/29/2016 15:56:27 [name] Small ([id number]) -------------------------------------------------------------------------------- Debridement Details Patient Name: [name] Small Date of Service: 02/29/2016 2:15 PM Medical Record Number: [id number] Patient Account Number: [id number] Date of Birth/Sex: Jan 19, 1928 (82 y.o. Male) Treating RN: Montey Hora Primary Care Physician: [name] Other Clinician: Referring Physician: [name] Treating Physician/Extender: Frann Rider in Treatment: 3 Debridement Performed for Wound #1 Left,Medial Metatarsal head first Assessment: Performed By: Physician Christin Fudge, MD Debridement: Debridement Pre-procedure Yes Verification/Time Out Taken: Start Time: 14:39 Pain Control: Lidocaine 4% Topical Solution Level: Skin/Subcutaneous Tissue Total Area Debrided (L x 1.9 (cm) x 1.5 (cm) = 2.85 (cm) W): Tissue and other Viable, Non-Viable, Eschar, Fibrin/Slough, Skin, Subcutaneous material debrided: Instrument: Forceps, Scissors Bleeding: Minimum Hemostasis Achieved: Pressure End Time: 14:45 Procedural Pain: 0 Post Procedural  Pain: 0 Response to Treatment: Procedure was tolerated well Post Debridement Measurements of Total Wound Length: (cm) 1.9 Width: (cm) 1.5 Depth: (cm) 0.3 Volume: (cm) 0.672 Post Procedure Diagnosis Same as Pre-procedure Electronic Signature(s) Signed: 02/29/2016 3:56:18 PM By: Christin Fudge MD, FACS Signed: 02/29/2016 5:12:38 PM By: Montey Hora Entered By: Christin Fudge on 02/29/2016 15:56:18 [name] Small ([id number]) -------------------------------------------------------------------------------- HPI Details Patient Name: [name] Small Date of Service: 02/29/2016 2:15 PM Medical Record Number: [id number] Patient Account Number: [id number] Date of Birth/Sex: 1928/03/15 (82 y.o. Male) Treating RN: Montey Hora Primary Care Physician: [name] Other Clinician: Referring Physician: [name] Treating Physician/Extender: Frann Rider in Treatment: 3 History of Present Illness Location: ulcerated area on the left medial foot Quality: Patient reports rest pain to affected area(s). he also has typical symptoms of claudication Severity: Patient states wound are getting worse. Duration: Patient has had the wound for < 6 weeks prior to presenting for treatment Timing: Pain in wound is constant (hurts all the time) Context: The wound occurred when the patient patient off of a callus or scab from that area and noticed the ulceration Modifying Factors: Other treatment(s) tried include:hydrogen peroxide and local ointments Associated Signs and Symptoms: Patient reports having:rest pain and typical pain of claudication HPI Description: 82 year old gentleman who was seen by his PCP recently having had a injury to the left medial foot after he peeled of a callus. He has been using clobetasol and peroxide. He's had a history of having peripheral arterial disease in both lower extremities. On inquiring with the patient he has clear symptoms of claudication and has had  this for over a year and a half. Past medical history significant for diabetes mellitus type 2, BPH, diverticulosis, hypertension, psoriasis, cellulitis of the left foot treated with hospitalization in 2006, coronary artery disease, bladder cancer in 2013, chronic kidney disease stage III, status post vasectomy,penile prosthesis, cardiac, appendectomy, tonsillectomy, incision and drainage of foot abscess, patient was recently started on Keflex for 10 days and referred to  the wound clinic. Of note the patient was worked up with vascular tests in March 2016 and was referred to Dr. Gwendalyn Ege who saw him on 10/17/2015 and noted an ABI of 1.0 on the right and 0.71 on the left and there was mild nonobstructive bilateral SFA disease. The patient had a one-vessel runoff bilaterally below the knee and the patient had no lower extremity ulceration. due to the nature of the below knee arterial disease he recommended medical therapy including cilostazol and Plavix. the patient was also put on a small dose of atorvastatin 10 mg daily. the patient was reviewed again on 01/16/2016 and at that stage the ulcer on the left foot was not noted. Medical management was continued and he would be seen in follow-up in 6 months time. Addendum: -- I spoke to Dr. Inda Merlin Aredia's office, and the call was taken by his colleague Dr. Esmond Plants -- he discussed the case with me in detail and would have the patient see Dr. Lovette Cliche on Monday when he was back from vacation. 02/15/2016 -- o I had a call from Dr. Jacqulyn Cane who saw the patient today and agrees that the patient needs an intervention with possibly stenting or angioplasty. He is going to set him up for this. X-ray of the left foot IMPRESSION:No definite evidence of osteomyelitis or fracture. He was taken up for a procedure by Dr. Jacqulyn Cane on 02/14/2016 Conclusion :1. No significant aortoiliac disease.2. Mild diffuse nonobstructive disease affecting the  left SFA. [name] Small, [name] Small ([id number]) 3. Occluded distal left popliteal artery with reconstitution via extensive collaterals into the proximal to mid peroneal artery which extends all the way down to the foot. This is the only patent vessel below the knee. Recommendations:The patient has extensive collaterals and there is a reasonable chance of being able to heal the ulcer without revascularization. I recommend wound care as being done. If there is no improvement within 2 weeks, then an endovascular attempt of opening the popliteal occlusion into the peroneal artery will be pursued. 02/29/2016 -- was seen by Dr. Jacqulyn Cane on 4/19 -- Successful angioplasty to the left popliteal and left peroneal arteries for total occlusion with establishment of in-line flow to the foot. Electronic Signature(s) Signed: 02/29/2016 3:56:44 PM By: Christin Fudge MD, FACS Previous Signature: 02/29/2016 2:18:17 PM Version By: Christin Fudge MD, FACS Entered By: Christin Fudge on 02/29/2016 15:56:44 [name] Small ([id number]) -------------------------------------------------------------------------------- Physical Exam Details Patient Name: [name] Small Date of Service: 02/29/2016 2:15 PM Medical Record Number: [id number] Patient Account Number: [id number] Date of Birth/Sex: 1928/06/25 (82 y.o. Male) Treating RN: Montey Hora Primary Care Physician: [name] Other Clinician: Referring Physician: [name] Treating Physician/Extender: Frann Rider in Treatment: 3 Constitutional . Pulse regular. Respirations normal and unlabored. Afebrile. . Eyes Nonicteric. Reactive to light. Ears, Nose, Mouth, and Throat Lips, teeth, and gums WNL.Marland Kitchen Moist mucosa without lesions. Neck supple and nontender. No palpable supraclavicular or cervical adenopathy. Normal sized without goiter. Respiratory WNL. No retractions.. Cardiovascular Pedal Pulses WNL. No clubbing, cyanosis or  edema. Lymphatic No adneopathy. No adenopathy. No adenopathy. Musculoskeletal Adexa without tenderness or enlargement.. Digits and nails w/o clubbing, cyanosis, infection, petechiae, ischemia, or inflammatory conditions.. Integumentary (Hair, Skin) No suspicious lesions. No crepitus or fluctuance. No peri-wound warmth or erythema. No masses.Marland Kitchen Psychiatric Judgement and insight Intact.. No evidence of depression, anxiety, or agitation.. Notes the ulcerated area in the medial part of the left forefoot continues to have a lot of subcutaneous debris and I  sharply debrided this with a curette. It does not probe down to bone. There was not much bleeding. Electronic Signature(s) Signed: 02/29/2016 3:57:24 PM By: Christin Fudge MD, FACS Entered By: Christin Fudge on 02/29/2016 15:57:23 [name] Small ([id number]) -------------------------------------------------------------------------------- Physician Orders Details Patient Name: [name] Small Date of Service: 02/29/2016 2:15 PM Medical Record Number: [id number] Patient Account Number: [id number] Date of Birth/Sex: 10/26/1928 (82 y.o. Male) Treating RN: Montey Hora Primary Care Physician: [name] Other Clinician: Referring Physician: [name] Treating Physician/Extender: Frann Rider in Treatment: 3 Verbal / Phone Orders: Yes Clinician: Montey Hora Read Back and Verified: Yes Diagnosis Coding Wound Cleansing Wound #1 Left,Medial Metatarsal head first o Clean wound with Normal Saline. o Cleanse wound with mild soap and water Anesthetic Wound #1 Left,Medial Metatarsal head first o Topical Lidocaine 4% cream applied to wound bed prior to debridement Primary Wound Dressing Wound #1 Left,Medial Metatarsal head first o Santyl Ointment Secondary Dressing Wound #1 Left,Medial Metatarsal head first o Gauze and Kerlix/Conform Dressing Change Frequency Wound #1 Left,Medial Metatarsal head first o  Change dressing every day. Follow-up Appointments Wound #1 Left,Medial Metatarsal head first o Return Appointment in 1 week. Additional Orders / Instructions Wound #1 Left,Medial Metatarsal head first o Increase protein intake. o Other: - work to keep blood sugars under 180 consistently Electronic Signature(s) Signed: 02/29/2016 4:14:45 PM By: Christin Fudge MD, FACS Signed: 02/29/2016 5:12:38 PM By: Scot Jun, Antonieta Loveless ([id number]) Entered By: Montey Hora on 02/29/2016 14:45:41 [name] Small ([id number]) -------------------------------------------------------------------------------- Progress Note Details Patient Name: [name] Small Date of Service: 02/29/2016 2:15 PM Medical Record Number: [id number] Patient Account Number: [id number] Date of Birth/Sex: 1928/02/21 (82 y.o. Male) Treating RN: Montey Hora Primary Care Physician: [name] Other Clinician: Referring Physician: [name] Treating Physician/Extender: Frann Rider in Treatment: 3 Subjective Chief Complaint Information obtained from Patient Patient presents to the wound care center today with an open arterial ulcer to the left medial forefoot and is also known to be a diabetic and has had these problems at least for 6 weeks History of Present Illness (HPI) The following HPI elements were documented for the patient's wound: Location: ulcerated area on the left medial foot Quality: Patient reports rest pain to affected area(s). he also has typical symptoms of claudication Severity: Patient states wound are getting worse. Duration: Patient has had the wound for < 6 weeks prior to presenting for treatment Timing: Pain in wound is constant (hurts all the time) Context: The wound occurred when the patient patient off of a callus or scab from that area and noticed the ulceration Modifying Factors: Other treatment(s) tried include:hydrogen peroxide and local ointments Associated  Signs and Symptoms: Patient reports having:rest pain and typical pain of claudication 82 year old gentleman who was seen by his PCP recently having had a injury to the left medial foot after he peeled of a callus. He has been using clobetasol and peroxide. He's had a history of having peripheral arterial disease in both lower extremities. On inquiring with the patient he has clear symptoms of claudication and has had this for over a year and a half. Past medical history significant for diabetes mellitus type 2, BPH, diverticulosis, hypertension, psoriasis, cellulitis of the left foot treated with hospitalization in 2006, coronary artery disease, bladder cancer in 2013, chronic kidney disease stage III, status post vasectomy,penile prosthesis, cardiac, appendectomy, tonsillectomy, incision and drainage of foot abscess, patient was recently started on Keflex for 10 days and referred to the wound clinic.  Of note the patient was worked up with vascular tests in March 2016 and was referred to Dr. Gwendalyn Ege who saw him on 10/17/2015 and noted an ABI of 1.0 on the right and 0.71 on the left and there was mild nonobstructive bilateral SFA disease. The patient had a one-vessel runoff bilaterally below the knee and the patient had no lower extremity ulceration. due to the nature of the below knee arterial disease he recommended medical therapy including cilostazol and Plavix. the patient was also put on a small dose of atorvastatin 10 mg daily. the patient was reviewed again on 01/16/2016 and at that stage the ulcer on the left foot was not noted. Medical management was continued and he would be seen in follow-up in 6 months time. Addendum: -- I spoke to Dr. Inda Merlin Aredia's office, and the call was taken by his colleague Dr. Esmond Plants -- he discussed the case with me in detail and would have the patient see Dr. Lovette Cliche on Monday [place]. ([id number]) when he was back from  vacation. 02/15/2016 -- I had a call from Dr. Jacqulyn Cane who saw the patient today and agrees that the patient needs an intervention with possibly stenting or angioplasty. He is going to set him up for this. X-ray of the left foot IMPRESSION:No definite evidence of osteomyelitis or fracture. He was taken up for a procedure by Dr. Jacqulyn Cane on 02/14/2016 Conclusion :1. No significant aortoiliac disease.2. Mild diffuse nonobstructive disease affecting the left SFA. 3. Occluded distal left popliteal artery with reconstitution via extensive collaterals into the proximal to mid peroneal artery which extends all the way down to the foot. This is the only patent vessel below the knee. Recommendations:The patient has extensive collaterals and there is a reasonable chance of being able to heal the ulcer without revascularization. I recommend wound care as being done. If there is no improvement within 2 weeks, then an endovascular attempt of opening the popliteal occlusion into the peroneal artery will be pursued. 02/29/2016 -- was seen by Dr. Jacqulyn Cane on 4/19 -- Successful angioplasty to the left popliteal and left peroneal arteries for total occlusion with establishment of in-line flow to the foot. Objective Constitutional Pulse regular. Respirations normal and unlabored. Afebrile. Vitals Time Taken: 2:21 PM, Height: 74 in, Weight: 217 lbs, BMI: 27.9, Temperature: 97.9 F, Pulse: 67 bpm, Respiratory Rate: 18 breaths/min, Blood Pressure: 144/66 mmHg. Eyes Nonicteric. Reactive to light. Ears, Nose, Mouth, and Throat Lips, teeth, and gums WNL.Marland Kitchen Moist mucosa without lesions. Neck supple and nontender. No palpable supraclavicular or cervical adenopathy. Normal sized without goiter. Respiratory WNL. No retractions.. Cardiovascular Pedal Pulses WNL. No clubbing, cyanosis or edema. Lymphatic No adneopathy. No adenopathy. No adenopathy. [name] Small, [name] Small  ([id number]) Musculoskeletal Adexa without tenderness or enlargement.. Digits and nails w/o clubbing, cyanosis, infection, petechiae, ischemia, or inflammatory conditions.Marland Kitchen Psychiatric Judgement and insight Intact.. No evidence of depression, anxiety, or agitation.. General Notes: the ulcerated area in the medial part of the left forefoot continues to have a lot of subcutaneous debris and I sharply debrided this with a curette. It does not probe down to bone. There was not much bleeding. Integumentary (Hair, Skin) No suspicious lesions. No crepitus or fluctuance. No peri-wound warmth or erythema. No masses.. Wound #1 status is Open. Original cause of wound was Gradually Appeared. The wound is located on the Left,Medial Metatarsal head first. The wound measures 1.9cm length x 1.5cm width x 0.1cm depth; 2.238cm^2 area and 0.224cm^3 volume. The wound is  limited to skin breakdown. There is no tunneling or undermining noted. There is a medium amount of sanguinous drainage noted. The wound margin is flat and intact. There is no granulation within the wound bed. There is a large (67-100%) amount of necrotic tissue within the wound bed including Eschar. The periwound skin appearance exhibited: Callus, Localized Edema, Dry/Scaly, Erythema. The periwound skin appearance did not exhibit: Crepitus, Excoriation, Fluctuance, Friable, Induration, Rash, Scarring, Maceration, Moist, Atrophie Blanche, Cyanosis, Ecchymosis, Hemosiderin Staining, Mottled, Pallor, Rubor. The surrounding wound skin color is noted with erythema which is circumferential. Periwound temperature was noted as No Abnormality. Procedures Wound #1 Wound #1 is a Diabetic Wound/Ulcer of the Lower Extremity located on the Left,Medial Metatarsal head first . There was a Skin/Subcutaneous Tissue Debridement [id number]) debridement with total area of 2.85 sq cm performed by Christin Fudge, MD. with the following instrument(s): Forceps and  Scissors to remove Viable and Non-Viable tissue/material including Fibrin/Slough, Eschar, Skin, and Subcutaneous after achieving pain control using Lidocaine 4% Topical Solution. A time out was conducted prior to the start of the procedure. A Minimum amount of bleeding was controlled with Pressure. The procedure was tolerated well with a pain level of 0 throughout and a pain level of 0 following the procedure. Post Debridement Measurements: 1.9cm length x 1.5cm width x 0.3cm depth; 0.672cm^3 volume. Post procedure Diagnosis Wound #1: Same as Pre-Procedure Plan [name] Small, [name] Small. ([id number]) Wound Cleansing: Wound #1 Left,Medial Metatarsal head first: Clean wound with Normal Saline. Cleanse wound with mild soap and water Anesthetic: Wound #1 Left,Medial Metatarsal head first: Topical Lidocaine 4% cream applied to wound bed prior to debridement Primary Wound Dressing: Wound #1 Left,Medial Metatarsal head first: Santyl Ointment Secondary Dressing: Wound #1 Left,Medial Metatarsal head first: Gauze and Kerlix/Conform Dressing Change Frequency: Wound #1 Left,Medial Metatarsal head first: Change dressing every day. Follow-up Appointments: Wound #1 Left,Medial Metatarsal head first: Return Appointment in 1 week. Additional Orders / Instructions: Wound #1 Left,Medial Metatarsal head first: Increase protein intake. Other: - work to keep blood sugars under 180 consistently I have recommended: 1. Santyl ointment locally with a bordered foam to be changed daily 2. Dr. Julien Nordmann Arida's angioplasty findings were discussed with the patient and I am very pleased that he has got good flow of blood coming down to his foot 3. Reviewing the wound care center for further management next week. Electronic Signature(s) Signed: 02/29/2016 3:58:17 PM By: Christin Fudge MD, FACS Entered By: Christin Fudge on 02/29/2016 15:58:17 [name] Small  ([id number]) -------------------------------------------------------------------------------- SuperBill Details Patient Name: [name] Small Date of Service: 02/29/2016 Medical Record Number: [id number] Patient Account Number: [id number] Date of Birth/Sex: Oct 02, 1928 (82 y.o. 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 Facility Procedures CPT4: Description Modifier Quantity Code [id number] - DEB SUBQ TISSUE 20 SQ CM/< 1 ICD-10 Description Diagnosis E11.621 Type 2 diabetes mellitus with foot ulcer I70.245 Atherosclerosis of native arteries of left leg with ulceration of other part of  foot L97.522 Non-pressure chronic ulcer of other part of left foot with fat layer exposed Physician Procedures CPT4: Description Modifier Quantity Code [id number] - WC PHYS LEVEL 3 - EST PT 25 1 ICD-10  Description Diagnosis E11.621 Type 2 diabetes mellitus with foot ulcer I70.245 Atherosclerosis of native arteries of left leg with ulceration of other part of  foot L97.522 Non-pressure chronic ulcer of other part of left foot with fat layer exposed CPT4: [id number] - WC PHYS SUBQ TISS 20 SQ CM 1 ICD-10 Description Diagnosis E11.621 Type 2 diabetes mellitus with foot ulcer I70.245 Atherosclerosis of native arteries of left leg with ulceration of other part of foot L97.522 Non-pressure chronic  ulcer of other part of left foot with fat layer exposed [name] Small, [name] Small ([id number]) Electronic Signature(s) Signed: 02/29/2016 3:58:41 PM By: Christin Fudge MD, FACS Entered By: Christin Fudge on  02/29/2016 15:58:41

## 2016-03-01 NOTE — Progress Notes (Signed)
DAELYNN, RODD ([id number]) Visit Report for 02/29/2016 Arrival Information Details Patient Name: [name] Small, [name] Small. Date of Service: 02/29/2016 2:15 PM Medical Record Number: [id number] Patient Account Number: [id number] Date of Birth/Sex: 04-03-1928 (82 y.o. Male) Treating RN: Montey Hora Primary Care Physician: [name] Other Clinician: Referring Physician: [name] Treating Physician/Extender: Frann Rider in Treatment: 3 Visit Information History Since Last Visit Added or deleted any medications: No Patient Arrived: [name] Small Any new allergies or adverse reactions: No Arrival Time: 14:20 Had a fall or experienced change in No Accompanied By: self activities of daily living that may affect Transfer Assistance: None risk of falls: Patient Identification Verified: Yes Signs or symptoms of abuse/neglect since last No Secondary Verification Process Completed: Yes visito Patient Has Alerts: Yes Hospitalized since last visit: No Patient Alerts: DMII Pain Present Now: No Electronic Signature(s) Signed: 02/29/2016 5:12:38 PM By: Montey Hora Entered By: Montey Hora on 02/29/2016 14:21:39 [name] Small ([id number]) -------------------------------------------------------------------------------- Encounter Discharge Information Details Patient Name: [name] Small Date of Service: 02/29/2016 2:15 PM Medical Record Number: [id number] Patient Account Number: [id number] Date of Birth/Sex: February 18, 1928 (82 y.o. Male) Treating RN: Montey Hora Primary Care Physician: [name] Other Clinician: Referring Physician: [name] Treating Physician/Extender: Frann Rider in Treatment: 3 Encounter Discharge Information Items Discharge Pain Level: 0 Discharge Condition: Stable Ambulatory Status: Cane Discharge Destination: Home Transportation: Private Auto Accompanied By: self Schedule Follow-up Appointment: Yes Medication Reconciliation  completed No and provided to Patient/Care [name] Small: Provided on Clinical Summary of Care: 02/29/2016 Form Type Recipient Paper Patient RB Electronic Signature(s) Signed: 02/29/2016 3:14:07 PM By: Montey Hora Previous Signature: 02/29/2016 2:52:37 PM Version By: Ruthine Dose Entered By: Montey Hora on 02/29/2016 15:14:07 [name] Small ([id number]) -------------------------------------------------------------------------------- Lower Extremity Assessment Details Patient Name: [name] Small Date of Service: 02/29/2016 2:15 PM Medical Record Number: [id number] Patient Account Number: [id number] Date of Birth/Sex: 11-Feb-1928 (82 y.o. Male) Treating RN: Montey Hora Primary Care Physician: [name] Other Clinician: Referring Physician: [name] Treating Physician/Extender: Frann Rider in Treatment: 3 Edema Assessment Assessed: [Left: No] [Right: No] Edema: [Left: N] [Right: o] Vascular Assessment Pulses: Posterior Tibial Palpable: [Left:Yes] Dorsalis Pedis Palpable: [Left:Yes] Extremity colors, hair growth, and conditions: Extremity Color: [Left:Normal] Hair Growth on Extremity: [Left:No] Temperature of Extremity: [Left:Warm] Capillary Refill: [Left:< 3 seconds] Electronic Signature(s) Signed: 02/29/2016 5:12:38 PM By: Montey Hora Entered By: Montey Hora on 02/29/2016 14:38:09 [name] Small ([id number]) -------------------------------------------------------------------------------- Multi Wound Chart Details Patient Name: [name] Small Date of Service: 02/29/2016 2:15 PM Medical Record Number: [id number] Patient Account Number: [id number] Date of Birth/Sex: 09-12-1928 (82 y.o. Male) Treating RN: Montey Hora Primary Care Physician: [name] Other Clinician: Referring Physician: [name] Treating Physician/Extender: Frann Rider in Treatment: 3 Vital Signs Height(in): 74 Pulse(bpm): 67 Weight(lbs): 217  Blood Pressure 144/66 (mmHg): Body Mass Index(BMI): 28 Temperature(F): 97.9 Respiratory Rate 18 (breaths/min): Photos: [1:No Photos] [N/A:N/A] Wound Location: [1:Left Metatarsal head first - N/A Medial] Wounding Event: [1:Gradually Appeared] [N/A:N/A] Primary Etiology: [1:Diabetic Wound/Ulcer of N/A the Lower Extremity] Comorbid History: [1:Chronic Obstructive Pulmonary Disease (COPD), Coronary Artery Disease, Hypertension, Peripheral Arterial Disease, Type II Diabetes, Neuropathy] [N/A:N/A] Date Acquired: [1:01/15/2016] [N/A:N/A] Weeks of Treatment: [1:3] [N/A:N/A] Wound Status: [1:Open] [N/A:N/A] Pending Amputation on Yes [N/A:N/A] Presentation: Measurements L x W x D 1.9x1.5x0.1 [N/A:N/A] (cm) Area (cm) : [1:2.238] [N/A:N/A] Volume (cm) : [1:0.224] [N/A:N/A] % Reduction in Area: [1:-90.00%] [N/A:N/A] % Reduction in Volume: 5.10% [N/A:N/A] Classification: [1:Grade 2] [N/A:N/A] Exudate Amount: [1:Medium] [N/A:N/A] Exudate Type: [1:Sanguinous] [  N/A:N/A] Exudate Color: [1:red] [N/A:N/A] Foul Odor After [1:Yes] [N/A:N/A] Cleansing: [name] Small, [name] Small ([id number]) Odor Anticipated Due to No N/A N/A Product Use: Wound Margin: Flat and Intact N/A N/A Granulation Amount: None Present (0%) N/A N/A Necrotic Amount: Large (67-100%) N/A N/A Necrotic Tissue: Eschar N/A N/A Exposed Structures: Fascia: No N/A N/A Fat: No Tendon: No Muscle: No Joint: No Bone: No Limited to Skin Breakdown Epithelialization: None N/A N/A Periwound Skin Texture: Edema: Yes N/A N/A Callus: Yes Excoriation: No Induration: No Crepitus: No Fluctuance: No Friable: No Rash: No Scarring: No Periwound Skin Dry/Scaly: Yes N/A N/A Moisture: Maceration: No Moist: No Periwound Skin Color: Erythema: Yes N/A N/A Atrophie Blanche: No Cyanosis: No Ecchymosis: No Hemosiderin Staining: No Mottled: No Pallor: No Rubor: No Erythema Location: Circumferential N/A N/A Temperature: No Abnormality N/A  N/A Tenderness on No N/A N/A Palpation: Wound Preparation: Ulcer Cleansing: N/A N/A Rinsed/Irrigated with Saline Topical Anesthetic Applied: Other: lidocaine 4% Treatment Notes [name] Small, [name] Small ([id number]) Electronic Signature(s) Signed: 02/29/2016 5:12:38 PM By: Montey Hora Entered By: Montey Hora on 02/29/2016 14:39:27 [name] Small ([id number]) -------------------------------------------------------------------------------- [place] Details Patient Name: [name] Small Date of Service: 02/29/2016 2:15 PM Medical Record Number: [id number] Patient Account Number: [id number] Date of Birth/Sex: 02-14-1928 (81 y.o. Male) Treating RN: Montey Hora Primary Care Physician: [name] Other Clinician: Referring Physician: [name] Treating Physician/Extender: Frann Rider in Treatment: 3 Active Inactive Abuse / Safety / Falls / Self Care Management Nursing Diagnoses: Potential for falls Goals: Patient will remain injury free Date Initiated: 02/08/2016 Goal Status: Active Interventions: Assess fall risk on admission and as needed Notes: Orientation to the Wound Care Program Nursing Diagnoses: Knowledge deficit related to the wound healing center program Goals: Patient/caregiver will verbalize understanding of the [facility] Date Initiated: 02/08/2016 Goal Status: Active Interventions: Provide education on orientation to the wound center Notes: Wound/Skin Impairment Nursing Diagnoses: Impaired tissue integrity Goals: Patient/caregiver will verbalize understanding of skin care regimen Date Initiated: 02/08/2016 [name] Small ([id number]) Goal Status: Active Ulcer/skin breakdown will have a volume reduction of 30% by week 4 Date Initiated: 02/08/2016 Goal Status: Active Ulcer/skin breakdown will have a volume reduction of 50% by week 8 Date Initiated: 02/08/2016 Goal Status: Active Ulcer/skin  breakdown will have a volume reduction of 80% by week 12 Date Initiated: 02/08/2016 Goal Status: Active Ulcer/skin breakdown will heal within 14 weeks Date Initiated: 02/08/2016 Goal Status: Active Interventions: Assess patient/caregiver ability to obtain necessary supplies Assess patient/caregiver ability to perform ulcer/skin care regimen upon admission and as needed Assess ulceration(s) every visit Notes: Electronic Signature(s) Signed: 02/29/2016 5:12:38 PM By: Montey Hora Entered By: Montey Hora on 02/29/2016 14:39:15 [name] Small ([id number]) -------------------------------------------------------------------------------- Patient/Caregiver Education Details Patient Name: [name] Small Date of Service: 02/29/2016 2:15 PM Medical Record Number: [id number] Patient Account Number: [id number] Date of Birth/Gender: 06-25-1928 (82 y.o. Male) Treating RN: Montey Hora Primary Care Physician: [name] Other Clinician: Referring Physician: [name] Treating Physician/Extender: Frann Rider in Treatment: 3 Education Assessment Education Provided To: Patient and Caregiver Education Topics Provided Wound/Skin Impairment: Handouts: Other: continue wound care as ordered Methods: Demonstration, Explain/Verbal Responses: State content correctly Electronic Signature(s) Signed: 02/29/2016 3:14:35 PM By: Montey Hora Entered By: Montey Hora on 02/29/2016 15:14:35 [name] Small ([id number]) -------------------------------------------------------------------------------- Wound Assessment Details Patient Name: [name] Small Date of Service: 02/29/2016 2:15 PM Medical Record Number: [id number] Patient Account Number: [id number] Date of Birth/Sex: 12-03-1927 (82 y.o. Male) Treating RN: Montey Hora Primary Care  Physician: [name] Other Clinician: Referring Physician: [name] Treating Physician/Extender: Frann Rider in  Treatment: 3 Wound Status Wound Number: 1 Primary Diabetic Wound/Ulcer of the Lower Etiology: Extremity Wound Location: Left Metatarsal head first - Medial Wound Open Status: Wounding Event: Gradually Appeared Comorbid Chronic Obstructive Pulmonary Disease Date Acquired: 01/15/2016 History: (COPD), Coronary Artery Disease, Weeks Of Treatment: 3 Hypertension, Peripheral Arterial Clustered Wound: No Disease, Type II Diabetes, Neuropathy Pending Amputation On Presentation Photos Photo Uploaded By: Montey Hora on 02/29/2016 16:02:33 Wound Measurements Length: (cm) 1.9 Width: (cm) 1.5 Depth: (cm) 0.1 Area: (cm) 2.238 Volume: (cm) 0.224 % Reduction in Area: -90% % Reduction in Volume: 5.1% Epithelialization: None Tunneling: No Undermining: No Wound Description Classification: Grade 2 Wound Margin: Flat and Intact Exudate Amount: Medium Exudate Type: Sanguinous Exudate Color: red Foul Odor After Cleansing: Yes Due to Product Use: No Wound Bed Granulation Amount: None Present (0%) Exposed Structure Necrotic Amount: Large (67-100%) Fascia Exposed: No [name] Small, [name] Small ([id number]) Necrotic Quality: Eschar Fat Layer Exposed: No Tendon Exposed: No Muscle Exposed: No Joint Exposed: No Bone Exposed: No Limited to Skin Breakdown Periwound Skin Texture Texture Color No Abnormalities Noted: No No Abnormalities Noted: No Callus: Yes Atrophie Blanche: No Crepitus: No Cyanosis: No Excoriation: No Ecchymosis: No Fluctuance: No Erythema: Yes Friable: No Erythema Location: Circumferential Induration: No Hemosiderin Staining: No Localized Edema: Yes Mottled: No Rash: No Pallor: No Scarring: No Rubor: No Moisture Temperature / Pain No Abnormalities Noted: No Temperature: No Abnormality Dry / Scaly: Yes Maceration: No Moist: No Wound Preparation Ulcer Cleansing: Rinsed/Irrigated with Saline Topical Anesthetic Applied: Other: lidocaine 4%, Treatment  Notes Wound #1 (Left, Medial Metatarsal head first) 1. Cleansed with: Clean wound with Normal Saline 2. Anesthetic Topical Lidocaine 4% cream to wound bed prior to debridement 4. Dressing Applied: Santyl Ointment 5. Secondary Dressing Applied Gauze and Kerlix/Conform 7. Secured with Recruitment consultant) Signed: 02/29/2016 5:12:38 PM By: Montey Hora Entered By: Montey Hora on 02/29/2016 14:29:38 [name] Small ([id number] Small, [name] Small ([id number]) -------------------------------------------------------------------------------- Vitals Details Patient Name: [name] Small Date of Service: 02/29/2016 2:15 PM Medical Record Number: [id number] Patient Account Number: [id number] Date of Birth/Sex: [birth year] (82 y.o. Male) Treating RN: Montey Hora Primary Care Physician: [name] Other Clinician: Referring Physician: [name] Treating Physician/Extender: Frann Rider in Treatment: 3 Vital Signs Time Taken: 14:21 Temperature (F): 97.9 Height (in): 74 Pulse (bpm): 67 Weight (lbs): 217 Respiratory Rate (breaths/min): 18 Body Mass Index (BMI): 27.9 Blood Pressure (mmHg): 144/66 Reference Range: 80 - 120 mg / dl Electronic Signature(s) Signed: 02/29/2016 5:12:38 PM By: Montey Hora Entered By: Montey Hora on 02/29/2016 14:23:50

## 2016-03-04 ENCOUNTER — Other Ambulatory Visit (INDEPENDENT_AMBULATORY_CARE_PROVIDER_SITE_OTHER): Payer: Medicare Other

## 2016-03-04 DIAGNOSIS — I25709 Atherosclerosis of coronary artery bypass graft(s), unspecified, with unspecified angina pectoris: Secondary | ICD-10-CM | POA: Diagnosis not present

## 2016-03-04 DIAGNOSIS — I509 Heart failure, unspecified: Secondary | ICD-10-CM

## 2016-03-04 DIAGNOSIS — I10 Essential (primary) hypertension: Secondary | ICD-10-CM | POA: Diagnosis not present

## 2016-03-04 LAB — CBC WITH DIFFERENTIAL/PLATELET
Basophils Absolute: 104 cells/uL (ref 0–200)
Basophils Relative: 1 %
Eosinophils Absolute: 312 cells/uL (ref 15–500)
Eosinophils Relative: 3 %
HCT: 42.7 % (ref 38.5–50.0)
Hemoglobin: 14.1 g/dL (ref 13.2–17.1)
Lymphocytes Relative: 12 %
Lymphs Abs: 1248 cells/uL (ref 850–3900)
MCH: 32 pg (ref 27.0–33.0)
MCHC: 33 g/dL (ref 32.0–36.0)
MCV: 96.8 fL (ref 80.0–100.0)
MPV: 10.3 fL (ref 7.5–12.5)
Monocytes Absolute: 832 cells/uL (ref 200–950)
Monocytes Relative: 8 %
Neutro Abs: 7904 cells/uL — ABNORMAL HIGH (ref 1500–7800)
Neutrophils Relative %: 76 %
Platelets: 230 10*3/uL (ref 140–400)
RBC: 4.41 MIL/uL (ref 4.20–5.80)
RDW: 12.9 % (ref 11.0–15.0)
WBC: 10.4 10*3/uL (ref 3.8–10.8)

## 2016-03-04 LAB — BASIC METABOLIC PANEL
BUN: 23 mg/dL (ref 7–25)
CO2: 32 mmol/L — ABNORMAL HIGH (ref 20–31)
Calcium: 8.4 mg/dL — ABNORMAL LOW (ref 8.6–10.3)
Chloride: 98 mmol/L (ref 98–110)
Creat: 1.48 mg/dL — ABNORMAL HIGH (ref 0.70–1.11)
Glucose, Bld: 212 mg/dL — ABNORMAL HIGH (ref 65–99)
Potassium: 4.9 mmol/L (ref 3.5–5.3)
Sodium: 139 mmol/L (ref 135–146)

## 2016-03-04 LAB — PROTIME-INR
INR: 1.07 (ref <1.50)
Prothrombin Time: 14 seconds (ref 11.6–15.2)

## 2016-03-07 ENCOUNTER — Encounter: Payer: Medicare Other | Admitting: Surgery

## 2016-03-07 DIAGNOSIS — I739 Peripheral vascular disease, unspecified: Secondary | ICD-10-CM | POA: Diagnosis not present

## 2016-03-07 DIAGNOSIS — I129 Hypertensive chronic kidney disease with stage 1 through stage 4 chronic kidney disease, or unspecified chronic kidney disease: Secondary | ICD-10-CM | POA: Diagnosis not present

## 2016-03-07 DIAGNOSIS — Z8551 Personal history of malignant neoplasm of bladder: Secondary | ICD-10-CM | POA: Diagnosis not present

## 2016-03-07 DIAGNOSIS — L97521 Non-pressure chronic ulcer of other part of left foot limited to breakdown of skin: Secondary | ICD-10-CM | POA: Diagnosis not present

## 2016-03-07 DIAGNOSIS — E11621 Type 2 diabetes mellitus with foot ulcer: Secondary | ICD-10-CM | POA: Diagnosis not present

## 2016-03-07 DIAGNOSIS — L97522 Non-pressure chronic ulcer of other part of left foot with fat layer exposed: Secondary | ICD-10-CM | POA: Diagnosis not present

## 2016-03-07 DIAGNOSIS — E1140 Type 2 diabetes mellitus with diabetic neuropathy, unspecified: Secondary | ICD-10-CM | POA: Diagnosis not present

## 2016-03-07 DIAGNOSIS — Z992 Dependence on renal dialysis: Secondary | ICD-10-CM | POA: Diagnosis not present

## 2016-03-07 DIAGNOSIS — L97529 Non-pressure chronic ulcer of other part of left foot with unspecified severity: Secondary | ICD-10-CM | POA: Diagnosis not present

## 2016-03-07 DIAGNOSIS — N183 Chronic kidney disease, stage 3 (moderate): Secondary | ICD-10-CM | POA: Diagnosis not present

## 2016-03-07 DIAGNOSIS — Z794 Long term (current) use of insulin: Secondary | ICD-10-CM | POA: Diagnosis not present

## 2016-03-07 DIAGNOSIS — I70245 Atherosclerosis of native arteries of left leg with ulceration of other part of foot: Secondary | ICD-10-CM | POA: Diagnosis not present

## 2016-03-07 DIAGNOSIS — I2510 Atherosclerotic heart disease of native coronary artery without angina pectoris: Secondary | ICD-10-CM | POA: Diagnosis not present

## 2016-03-07 DIAGNOSIS — Z87891 Personal history of nicotine dependence: Secondary | ICD-10-CM | POA: Diagnosis not present

## 2016-03-07 DIAGNOSIS — J449 Chronic obstructive pulmonary disease, unspecified: Secondary | ICD-10-CM | POA: Diagnosis not present

## 2016-03-08 NOTE — Progress Notes (Signed)
BALJINDER, KRECH ([id number]) Visit Report for 03/07/2016 Arrival Information Details Patient Name: [name] Small, [name] Small. Date of Service: 03/07/2016 1:30 PM Medical Record Number: [id number] Patient Account Number: [id number] Date of Birth/Sex: 16-Apr-1928 (82 y.o. Male) Treating RN: Ahmed Prima Primary Care Physician: [name] Other Clinician: Referring Physician: [name] Treating Physician/Extender: Frann Rider in Treatment: 4 Visit Information History Since Last Visit All ordered tests and consults were completed: No Patient Arrived: [name] Small Added or deleted any medications: No Arrival Time: 13:25 Any new allergies or adverse reactions: No Accompanied By: wife Had a fall or experienced change in No Transfer Assistance: [employer] activities of daily living that may affect Patient Lift risk of falls: Patient Identification Verified: Yes Signs or symptoms of abuse/neglect since last No Secondary Verification Process Yes visito Completed: Hospitalized since last visit: No Patient Requires Transmission- No Pain Present Now: No Based Precautions: Patient Has Alerts: Yes Patient Alerts: DMII Electronic Signature(s) Signed: 03/07/2016 6:42:13 PM By: Alric Quan Entered By: Alric Quan on 03/07/2016 13:26:09 [name] Small ([id number]) -------------------------------------------------------------------------------- Encounter Discharge Information Details Patient Name: [name] Small Date of Service: 03/07/2016 1:30 PM Medical Record Number: [id number] Patient Account Number: [id number] Date of Birth/Sex: 07/17/1928 (82 y.o. Male) Treating RN: Ahmed Prima Primary Care Physician: [name] Other Clinician: Referring Physician: [name] Treating Physician/Extender: Frann Rider in Treatment: 4 Encounter Discharge Information Items Discharge Pain Level: 0 Discharge Condition: Stable Ambulatory Status: Cane Discharge  Destination: Home Transportation: Private Auto Accompanied By: wife Schedule Follow-up Appointment: Yes Medication Reconciliation completed Yes and provided to Patient/Care [name] Small: Provided on Clinical Summary of Care: 03/07/2016 Form Type Recipient Paper Patient [id number] Electronic Signature(s) Signed: 03/07/2016 1:57:31 PM By: Ruthine Dose Entered By: Ruthine Dose on 03/07/2016 13:57:31 [name] Small ([id number]) -------------------------------------------------------------------------------- Lower Extremity Assessment Details Patient Name: [name] Small Date of Service: 03/07/2016 1:30 PM Medical Record Number: [id number] Patient Account Number: [id number] Date of Birth/Sex: 1928/11/02 (82 y.o. Male) Treating RN: Ahmed Prima Primary Care Physician: [name] Other Clinician: Referring Physician: [name] Treating Physician/Extender: Frann Rider in Treatment: 4 Vascular Assessment Pulses: Posterior Tibial Dorsalis Pedis Palpable: [Left:Yes] Extremity colors, hair growth, and conditions: Extremity Color: [Left:Normal] Temperature of Extremity: [Left:Warm] Capillary Refill: [Left:< 3 seconds] Electronic Signature(s) Signed: 03/07/2016 6:42:13 PM By: Alric Quan Entered By: Alric Quan on 03/07/2016 14:01:12 [name] Small ([id number]) -------------------------------------------------------------------------------- Multi Wound Chart Details Patient Name: [name] Small Date of Service: 03/07/2016 1:30 PM Medical Record Number: [id number] Patient Account Number: [id number] Date of Birth/Sex: [birth year] (82 y.o. Male) Treating RN: Ahmed Prima Primary Care Physician: [name] Other Clinician: Referring Physician: [name] Treating Physician/Extender: Frann Rider in Treatment: 4 Vital Signs Height(in): 74 Pulse(bpm): 74 Weight(lbs): 217 Blood Pressure 121/56 (mmHg): Body Mass Index(BMI):  28 Temperature(F): 97.9 Respiratory Rate 18 (breaths/min): Photos: [1:No Photos] [N/A:N/A] Wound Location: [1:Left Metatarsal head first - N/A Medial] Wounding Event: [1:Gradually Appeared] [N/A:N/A] Primary Etiology: [1:Diabetic Wound/Ulcer of N/A the Lower Extremity] Comorbid History: [1:Chronic Obstructive Pulmonary Disease (COPD), Coronary Artery Disease, Hypertension, Peripheral Arterial Disease, Type II Diabetes, Neuropathy] [N/A:N/A] Date Acquired: [1:01/15/2016] [N/A:N/A] Weeks of Treatment: [1:4] [N/A:N/A] Wound Status: [1:Open] [N/A:N/A] Pending Amputation on Yes [N/A:N/A] Presentation: Measurements L x W x D 1.8x1.9x0.1 [N/A:N/A] (cm) Area (cm) : [1:2.686] [N/A:N/A] Volume (cm) : [1:0.269] [N/A:N/A] % Reduction in Area: [1:-128.00%] [N/A:N/A] % Reduction in Volume: -14.00% [N/A:N/A] Classification: [1:Grade 2] [N/A:N/A] Exudate Amount: [1:Medium] [N/A:N/A] Exudate Type: [1:Serosanguineous] [N/A:N/A] Exudate Color: [1:red, brown] [N/A:N/A] Foul Odor After [1:Yes] [N/A:N/A] Cleansing:  [name] Small, [name] Small ([id number]) Odor Anticipated Due to No N/A N/A Product Use: Wound Margin: Flat and Intact N/A N/A Granulation Amount: None Present (0%) N/A N/A Necrotic Amount: Large (67-100%) N/A N/A Necrotic Tissue: Eschar, Adherent Slough N/A N/A Exposed Structures: Fascia: No N/A N/A Fat: No Tendon: No Muscle: No Joint: No Bone: No Limited to Skin Breakdown Epithelialization: None N/A N/A Periwound Skin Texture: Edema: Yes N/A N/A Callus: Yes Excoriation: No Induration: No Crepitus: No Fluctuance: No Friable: No Rash: No Scarring: No Periwound Skin Dry/Scaly: Yes N/A N/A Moisture: Maceration: No Moist: No Periwound Skin Color: Erythema: Yes N/A N/A Atrophie Blanche: No Cyanosis: No Ecchymosis: No Hemosiderin Staining: No Mottled: No Pallor: No Rubor: No Erythema Location: Circumferential N/A N/A Temperature: No Abnormality N/A N/A Tenderness on Yes  N/A N/A Palpation: Wound Preparation: Ulcer Cleansing: N/A N/A Rinsed/Irrigated with Saline Topical Anesthetic Applied: Other: lidocaine 4% Treatment Notes [name] Small, [name] Small ([id number]) Electronic Signature(s) Signed: 03/07/2016 6:42:13 PM By: Alric Quan Entered By: Alric Quan on 03/07/2016 13:36:08 [name] Small ([id number]) -------------------------------------------------------------------------------- Cylinder Details Patient Name: [name] Small Date of Service: 03/07/2016 1:30 PM Medical Record Number: [id number] Patient Account Number: [id number] Date of Birth/Sex: 19-Feb-1928 (82 y.o. Male) Treating RN: Ahmed Prima Primary Care Physician: [name] Other Clinician: Referring Physician: [name] Treating Physician/Extender: Frann Rider in Treatment: 4 Active Inactive Abuse / Safety / Falls / Self Care Management Nursing Diagnoses: Potential for falls Goals: Patient will remain injury free Date Initiated: 02/08/2016 Goal Status: Active Interventions: Assess fall risk on admission and as needed Notes: Orientation to the Wound Care Program Nursing Diagnoses: Knowledge deficit related to the wound healing center program Goals: Patient/caregiver will verbalize understanding of the [facility] Date Initiated: 02/08/2016 Goal Status: Active Interventions: Provide education on orientation to the wound center Notes: Wound/Skin Impairment Nursing Diagnoses: Impaired tissue integrity Goals: Patient/caregiver will verbalize understanding of skin care regimen Date Initiated: 02/08/2016 [name] Small ([id number]) Goal Status: Active Ulcer/skin breakdown will have a volume reduction of 30% by week 4 Date Initiated: 02/08/2016 Goal Status: Active Ulcer/skin breakdown will have a volume reduction of 50% by week 8 Date Initiated: 02/08/2016 Goal Status: Active Ulcer/skin breakdown will have a  volume reduction of 80% by week 12 Date Initiated: 02/08/2016 Goal Status: Active Ulcer/skin breakdown will heal within 14 weeks Date Initiated: 02/08/2016 Goal Status: Active Interventions: Assess patient/caregiver ability to obtain necessary supplies Assess patient/caregiver ability to perform ulcer/skin care regimen upon admission and as needed Assess ulceration(s) every visit Notes: Electronic Signature(s) Signed: 03/07/2016 6:42:13 PM By: Alric Quan Entered By: Alric Quan on 03/07/2016 13:36:01 [name] Small ([id number]) -------------------------------------------------------------------------------- Pain Assessment Details Patient Name: [name] Small Date of Service: 03/07/2016 1:30 PM Medical Record Number: [id number] Patient Account Number: [id number] Date of Birth/Sex: 1928-01-08 (82 y.o. Male) Treating RN: Ahmed Prima Primary Care Physician: [name] Other Clinician: Referring Physician: [name] Treating Physician/Extender: Frann Rider in Treatment: 4 Active Problems Location of Pain Severity and Description of Pain Patient Has Paino No Site Locations Pain Management and Medication Current Pain Management: Electronic Signature(s) Signed: 03/07/2016 6:42:13 PM By: Alric Quan Entered By: Alric Quan on 03/07/2016 13:26:14 [name] Small ([id number]) -------------------------------------------------------------------------------- Patient/Caregiver Education Details Patient Name: [name] Small Date of Service: 03/07/2016 1:30 PM Medical Record Number: [id number] Patient Account Number: [id number] Date of Birth/Gender: [birth year] (82 y.o. Male) Treating RN: Ahmed Prima Primary Care Physician: [name] Other Clinician: Referring Physician: [name] Treating Physician/Extender: Christin Fudge  Weeks in Treatment: 4 Education Assessment Education Provided To: Patient Education Topics  Provided Wound/Skin Impairment: Handouts: Other: change dressing as ordered Methods: Demonstration, Explain/Verbal Responses: State content correctly Electronic Signature(s) Signed: 03/07/2016 6:42:13 PM By: Alric Quan Entered By: Alric Quan on 03/07/2016 13:47:16 [name] Small ([id number]) -------------------------------------------------------------------------------- Wound Assessment Details Patient Name: [name] Small Date of Service: 03/07/2016 1:30 PM Medical Record Number: [id number] Patient Account Number: [id number] Date of Birth/Sex: [birth year] (82 y.o. Male) Treating RN: Ahmed Prima Primary Care Physician: [name] Other Clinician: Referring Physician: [name] Treating Physician/Extender: Frann Rider in Treatment: 4 Wound Status Wound Number: 1 Primary Diabetic Wound/Ulcer of the Lower Etiology: Extremity Wound Location: Left Metatarsal head first - Medial Wound Open Status: Wounding Event: Gradually Appeared Comorbid Chronic Obstructive Pulmonary Disease Date Acquired: 01/15/2016 History: (COPD), Coronary Artery Disease, Weeks Of Treatment: 4 Hypertension, Peripheral Arterial Clustered Wound: No Disease, Type II Diabetes, Neuropathy Pending Amputation On Presentation Photos Photo Uploaded By: Alric Quan on 03/07/2016 16:54:47 Wound Measurements Length: (cm) 1.8 Width: (cm) 1.9 Depth: (cm) 0.1 Area: (cm) 2.686 Volume: (cm) 0.269 % Reduction in Area: -128% % Reduction in Volume: -14% Epithelialization: None Tunneling: No Undermining: No Wound Description Classification: Grade 2 Wound Margin: Flat and Intact Exudate Amount: Medium Exudate Type: Serosanguineous Exudate Color: red, brown Foul Odor After Cleansing: Yes Due to Product Use: No Wound Bed Granulation Amount: None Present (0%) Exposed Structure Necrotic Amount: Large (67-100%) Fascia Exposed: No [name] Small, [name] Small ([id number]) Necrotic  Quality: Eschar, Adherent Slough Fat Layer Exposed: No Tendon Exposed: No Muscle Exposed: No Joint Exposed: No Bone Exposed: No Limited to Skin Breakdown Periwound Skin Texture Texture Color No Abnormalities Noted: No No Abnormalities Noted: No Callus: Yes Atrophie Blanche: No Crepitus: No Cyanosis: No Excoriation: No Ecchymosis: No Fluctuance: No Erythema: Yes Friable: No Erythema Location: Circumferential Induration: No Hemosiderin Staining: No Localized Edema: Yes Mottled: No Rash: No Pallor: No Scarring: No Rubor: No Moisture Temperature / Pain No Abnormalities Noted: No Temperature: No Abnormality Dry / Scaly: Yes Tenderness on Palpation: Yes Maceration: No Moist: No Wound Preparation Ulcer Cleansing: Rinsed/Irrigated with Saline Topical Anesthetic Applied: Other: lidocaine 4%, Treatment Notes Wound #1 (Left, Medial Metatarsal head first) 1. Cleansed with: Clean wound with Normal Saline 2. Anesthetic Topical Lidocaine 4% cream to wound bed prior to debridement 4. Dressing Applied: Santyl Ointment 5. Secondary Dressing Applied Dry Gauze 7. Secured with Tape Notes conform Electronic Signature(s) Signed: 03/07/2016 6:42:13 PM By: Genelle Gather, Antonieta Loveless ([id number]) Entered By: Alric Quan on 03/07/2016 13:32:09 [name] Small ([id number]) -------------------------------------------------------------------------------- Vitals Details Patient Name: [name] Small Date of Service: 03/07/2016 1:30 PM Medical Record Number: [id number] Patient Account Number: [id number] Date of Birth/Sex: October 03, 1928 (82 y.o. Male) Treating RN: Ahmed Prima Primary Care Physician: [name] Other Clinician: Referring Physician: [name] Treating Physician/Extender: Frann Rider in Treatment: 4 Vital Signs Time Taken: 13:26 Temperature (F): 97.9 Height (in): 74 Pulse (bpm): 74 Weight (lbs): 217 Respiratory Rate  (breaths/min): 18 Body Mass Index (BMI): 27.9 Blood Pressure (mmHg): 121/56 Reference Range: 80 - 120 mg / dl Electronic Signature(s) Signed: 03/07/2016 6:42:13 PM By: Alric Quan Entered By: Alric Quan on 03/07/2016 13:27:55

## 2016-03-08 NOTE — Progress Notes (Signed)
YANDELL, COLGLAZIER ([id number]) Visit Report for 03/07/2016 Chief Complaint Document Details Patient Name: [name] Small, [name] Small. Date of Service: 03/07/2016 1:30 PM Medical Record Number: [id number] Patient Account Number: [id number] Date of Birth/Sex: 1928/08/09 (82 y.o. Male) Treating RN: Ahmed Prima Primary Care Physician: [name] Other Clinician: Referring Physician: [name] Treating Physician/Extender: Frann Rider in Treatment: 4 Information Obtained from: Patient Chief Complaint Patient presents to the wound care center today with an open arterial ulcer to the left medial forefoot and is also known to be a diabetic and has had these problems at least for 6 weeks Electronic Signature(s) Signed: 03/07/2016 1:58:51 PM By: Christin Fudge MD, FACS Entered By: Christin Fudge on 03/07/2016 13:58:50 [name] Small ([id number]) -------------------------------------------------------------------------------- Debridement Details Patient Name: [name] Small Date of Service: 03/07/2016 1:30 PM Medical Record Number: [id number] Patient Account Number: [id number] Date of Birth/Sex: [birth year] (82 y.o. Male) Treating RN: Ahmed Prima Primary Care Physician: [name] Other Clinician: Referring Physician: [name] Treating Physician/Extender: Frann Rider in Treatment: 4 Debridement Performed for Wound #1 Left,Medial Metatarsal head first Assessment: Performed By: Physician Christin Fudge, MD Debridement: Debridement Pre-procedure Yes Verification/Time Out Taken: Start Time: 13:37 Pain Control: Lidocaine 4% Topical Solution Level: Skin/Subcutaneous Tissue Total Area Debrided (L x 1.8 (cm) x 1.9 (cm) = 3.42 (cm) W): Tissue and other Viable, Non-Viable, Eschar, Exudate, Fibrin/Slough, Subcutaneous material debrided: Instrument: Curette, Forceps, Scissors Bleeding: Minimum Hemostasis Achieved: Pressure End Time: 13:45 Procedural Pain:  0 Post Procedural Pain: 0 Response to Treatment: Procedure was tolerated well Post Debridement Measurements of Total Wound Length: (cm) 1.8 Width: (cm) 1.9 Depth: (cm) 0.2 Volume: (cm) 0.537 Post Procedure Diagnosis Same as Pre-procedure Electronic Signature(s) Signed: 03/07/2016 1:58:39 PM By: Christin Fudge MD, FACS Signed: 03/07/2016 6:42:13 PM By: Alric Quan Entered By: Christin Fudge on 03/07/2016 13:58:39 [name] Small ([id number]) -------------------------------------------------------------------------------- HPI Details Patient Name: [name] Small Date of Service: 03/07/2016 1:30 PM Medical Record Number: [id number] Patient Account Number: [id number] Date of Birth/Sex: December 19, 1927 (82 y.o. Male) Treating RN: Ahmed Prima Primary Care Physician: [name] Other Clinician: Referring Physician: [name] Treating Physician/Extender: Frann Rider in Treatment: 4 History of Present Illness Location: ulcerated area on the left medial foot Quality: Patient reports rest pain to affected area(s). he also has typical symptoms of claudication Severity: Patient states wound are getting worse. Duration: Patient has had the wound for < 6 weeks prior to presenting for treatment Timing: Pain in wound is constant (hurts all the time) Context: The wound occurred when the patient patient off of a callus or scab from that area and noticed the ulceration Modifying Factors: Other treatment(s) tried include:hydrogen peroxide and local ointments Associated Signs and Symptoms: Patient reports having:rest pain and typical pain of claudication HPI Description: 82 year old gentleman who was seen by his PCP recently having had a injury to the left medial foot after he peeled of a callus. He has been using clobetasol and peroxide. He's had a history of having peripheral arterial disease in both lower extremities. On inquiring with the patient he has clear symptoms of  claudication and has had this for over a year and a half. Past medical history significant for diabetes mellitus type 2, BPH, diverticulosis, hypertension, psoriasis, cellulitis of the left foot treated with hospitalization in 2006, coronary artery disease, bladder cancer in 2013, chronic kidney disease stage III, status post vasectomy,penile prosthesis, cardiac, appendectomy, tonsillectomy, incision and drainage of foot abscess, patient was recently started on Keflex for 10 days and referred  to the wound clinic. Of note the patient was worked up with vascular tests in March 2016 and was referred to Dr. Gwendalyn Ege who saw him on 10/17/2015 and noted an ABI of 1.0 on the right and 0.71 on the left and there was mild nonobstructive bilateral SFA disease. The patient had a one-vessel runoff bilaterally below the knee and the patient had no lower extremity ulceration. due to the nature of the below knee arterial disease he recommended medical therapy including cilostazol and Plavix. the patient was also put on a small dose of atorvastatin 10 mg daily. the patient was reviewed again on 01/16/2016 and at that stage the ulcer on the left foot was not noted. Medical management was continued and he would be seen in follow-up in 6 months time. Addendum: -- I spoke to Dr. Inda Merlin Aredia's office, and the call was taken by his colleague Dr. Esmond Plants -- he discussed the case with me in detail and would have the patient see Dr. Lovette Cliche on Monday when he was back from vacation. 02/15/2016 -- o I had a call from Dr. Jacqulyn Cane who saw the patient today and agrees that the patient needs an intervention with possibly stenting or angioplasty. He is going to set him up for this. X-ray of the left foot IMPRESSION:No definite evidence of osteomyelitis or fracture. He was taken up for a procedure by Dr. Jacqulyn Cane on 02/14/2016 Conclusion :1. No significant aortoiliac disease.2. Mild diffuse  nonobstructive disease affecting the left SFA. MAMIE, PECORE ([id number]) 3. Occluded distal left popliteal artery with reconstitution via extensive collaterals into the proximal to mid peroneal artery which extends all the way down to the foot. This is the only patent vessel below the knee. Recommendations:The patient has extensive collaterals and there is a reasonable chance of being able to heal the ulcer without revascularization. I recommend wound care as being done. If there is no improvement within 2 weeks, then an endovascular attempt of opening the popliteal occlusion into the peroneal artery will be pursued. 02/29/2016 -- was seen by Dr. Jacqulyn Cane on 4/19 -- Successful angioplasty to the left popliteal and left peroneal arteries for total occlusion with establishment of in-line flow to the foot. Electronic Signature(s) Signed: 03/07/2016 1:59:04 PM By: Christin Fudge MD, FACS Entered By: Christin Fudge on 03/07/2016 13:59:03 [name] Small ([id number]) -------------------------------------------------------------------------------- Physical Exam Details Patient Name: [name] Small Date of Service: 03/07/2016 1:30 PM Medical Record Number: [id number] Patient Account Number: [id number] Date of Birth/Sex: 20-Apr-1928 (82 y.o. Male) Treating RN: Ahmed Prima Primary Care Physician: [name] Other Clinician: Referring Physician: [name] Treating Physician/Extender: Frann Rider in Treatment: 4 Constitutional . Pulse regular. Respirations normal and unlabored. Afebrile. . Eyes Nonicteric. Reactive to light. Ears, Nose, Mouth, and Throat Lips, teeth, and gums WNL.Marland Kitchen Moist mucosa without lesions. Neck supple and nontender. No palpable supraclavicular or cervical adenopathy. Normal sized without goiter. Respiratory WNL. No retractions.. Cardiovascular Pedal Pulses WNL. No clubbing, cyanosis or edema. Lymphatic No adneopathy. No adenopathy. No  adenopathy. Musculoskeletal Adexa without tenderness or enlargement.. Digits and nails w/o clubbing, cyanosis, infection, petechiae, ischemia, or inflammatory conditions.. Integumentary (Hair, Skin) No suspicious lesions. No crepitus or fluctuance. No peri-wound warmth or erythema. No masses.Marland Kitchen Psychiatric Judgement and insight Intact.. No evidence of depression, anxiety, or agitation.. Notes the patient has some subcutaneous debris and some surrounding callus and with a curette, forceps and scissors I have removed as much of necrotic debris as possible and the edges have  some healthy granulation tissue. There is no bleeding. Electronic Signature(s) Signed: 03/07/2016 1:59:43 PM By: Christin Fudge MD, FACS Entered By: Christin Fudge on 03/07/2016 13:59:43 [name] Small ([id number]) -------------------------------------------------------------------------------- Physician Orders Details Patient Name: [name] Small Date of Service: 03/07/2016 1:30 PM Medical Record Number: [id number] Patient Account Number: [id number] Date of Birth/Sex: [birth year] (82 y.o. Male) Treating RN: Ahmed Prima Primary Care Physician: [name] Other Clinician: Referring Physician: [name] Treating Physician/Extender: Frann Rider in Treatment: 4 Verbal / Phone Orders: Yes Clinician: Pinkerton, Debi Read Back and Verified: Yes Diagnosis Coding Wound Cleansing Wound #1 Left,Medial Metatarsal head first o Clean wound with Normal Saline. o Cleanse wound with mild soap and water Anesthetic Wound #1 Left,Medial Metatarsal head first o Topical Lidocaine 4% cream applied to wound bed prior to debridement Primary Wound Dressing Wound #1 Left,Medial Metatarsal head first o Santyl Ointment Secondary Dressing Wound #1 Left,Medial Metatarsal head first o Gauze and Kerlix/Conform Dressing Change Frequency Wound #1 Left,Medial Metatarsal head first o Change dressing every  day. Follow-up Appointments Wound #1 Left,Medial Metatarsal head first o Return Appointment in 1 week. Additional Orders / Instructions Wound #1 Left,Medial Metatarsal head first o Increase protein intake. o Other: - work to keep blood sugars under 180 consistently Electronic Signature(s) Signed: 03/07/2016 4:28:22 PM By: Christin Fudge MD, FACS Signed: 03/07/2016 6:42:13 PM By: Genelle Gather, Antonieta Loveless ([id number]) Entered By: Alric Quan on 03/07/2016 13:45:55 [name] Small ([id number]) -------------------------------------------------------------------------------- Problem List Details Patient Name: CHANDRA, TROMP. Date of Service: 03/07/2016 1:30 PM Medical Record Number: [id number] Patient Account Number: [id number] Date of Birth/Sex: 02-Jun-1928 (82 y.o. Male) Treating RN: Ahmed Prima Primary Care Physician: [name] Other Clinician: Referring Physician: [name] Treating Physician/Extender: Frann Rider in Treatment: 4 Active Problems ICD-10 Encounter Code Description Active Date Diagnosis E11.621 Type 2 diabetes mellitus with foot ulcer 02/08/2016 Yes I70.245 Atherosclerosis of native arteries of left leg with ulceration 02/08/2016 Yes of other part of foot L97.522 Non-pressure chronic ulcer of other part of left foot with fat 02/08/2016 Yes layer exposed Inactive Problems Resolved Problems Electronic Signature(s) Signed: 03/07/2016 1:58:22 PM By: Christin Fudge MD, FACS Entered By: Christin Fudge on 03/07/2016 13:58:22 [name] Small ([id number]) -------------------------------------------------------------------------------- Progress Note Details Patient Name: [name] Small Date of Service: 03/07/2016 1:30 PM Medical Record Number: [id number] Patient Account Number: [id number] Date of Birth/Sex: 01-01-1928 (82 y.o. Male) Treating RN: Ahmed Prima Primary Care Physician: [name] Other Clinician: Referring  Physician: [name] Treating Physician/Extender: Frann Rider in Treatment: 4 Subjective Chief Complaint Information obtained from Patient Patient presents to the wound care center today with an open arterial ulcer to the left medial forefoot and is also known to be a diabetic and has had these problems at least for 6 weeks History of Present Illness (HPI) The following HPI elements were documented for the patient's wound: Location: ulcerated area on the left medial foot Quality: Patient reports rest pain to affected area(s). he also has typical symptoms of claudication Severity: Patient states wound are getting worse. Duration: Patient has had the wound for < 6 weeks prior to presenting for treatment Timing: Pain in wound is constant (hurts all the time) Context: The wound occurred when the patient patient off of a callus or scab from that area and noticed the ulceration Modifying Factors: Other treatment(s) tried include:hydrogen peroxide and local ointments Associated Signs and Symptoms: Patient reports having:rest pain and typical pain of claudication 82 year old gentleman who was seen by his PCP  recently having had a injury to the left medial foot after he peeled of a callus. He has been using clobetasol and peroxide. He's had a history of having peripheral arterial disease in both lower extremities. On inquiring with the patient he has clear symptoms of claudication and has had this for over a year and a half. Past medical history significant for diabetes mellitus type 2, BPH, diverticulosis, hypertension, psoriasis, cellulitis of the left foot treated with hospitalization in 2006, coronary artery disease, bladder cancer in 2013, chronic kidney disease stage III, status post vasectomy,penile prosthesis, cardiac, appendectomy, tonsillectomy, incision and drainage of foot abscess, patient was recently started on Keflex for 10 days and referred to the wound clinic. Of note  the patient was worked up with vascular tests in March 2016 and was referred to Dr. Gwendalyn Ege who saw him on 10/17/2015 and noted an ABI of 1.0 on the right and 0.71 on the left and there was mild nonobstructive bilateral SFA disease. The patient had a one-vessel runoff bilaterally below the knee and the patient had no lower extremity ulceration. due to the nature of the below knee arterial disease he recommended medical therapy including cilostazol and Plavix. the patient was also put on a small dose of atorvastatin 10 mg daily. the patient was reviewed again on 01/16/2016 and at that stage the ulcer on the left foot was not noted. Medical management was continued and he would be seen in follow-up in 6 months time. Addendum: -- I spoke to Dr. Inda Merlin Aredia's office, and the call was taken by his colleague Dr. Esmond Plants -- he discussed the case with me in detail and would have the patient see Dr. Lovette Cliche on Monday [place]. ([id number]) when he was back from vacation. 02/15/2016 -- I had a call from Dr. Jacqulyn Cane who saw the patient today and agrees that the patient needs an intervention with possibly stenting or angioplasty. He is going to set him up for this. X-ray of the left foot IMPRESSION:No definite evidence of osteomyelitis or fracture. He was taken up for a procedure by Dr. Jacqulyn Cane on 02/14/2016 Conclusion :1. No significant aortoiliac disease.2. Mild diffuse nonobstructive disease affecting the left SFA. 3. Occluded distal left popliteal artery with reconstitution via extensive collaterals into the proximal to mid peroneal artery which extends all the way down to the foot. This is the only patent vessel below the knee. Recommendations:The patient has extensive collaterals and there is a reasonable chance of being able to heal the ulcer without revascularization. I recommend wound care as being done. If there is no improvement within 2 weeks, then an  endovascular attempt of opening the popliteal occlusion into the peroneal artery will be pursued. 02/29/2016 -- was seen by Dr. Jacqulyn Cane on 4/19 -- Successful angioplasty to the left popliteal and left peroneal arteries for total occlusion with establishment of in-line flow to the foot. Objective Constitutional Pulse regular. Respirations normal and unlabored. Afebrile. Vitals Time Taken: 1:26 PM, Height: 74 in, Weight: 217 lbs, BMI: 27.9, Temperature: 97.9 F, Pulse: 74 bpm, Respiratory Rate: 18 breaths/min, Blood Pressure: 121/56 mmHg. Eyes Nonicteric. Reactive to light. Ears, Nose, Mouth, and Throat Lips, teeth, and gums WNL.Marland Kitchen Moist mucosa without lesions. Neck supple and nontender. No palpable supraclavicular or cervical adenopathy. Normal sized without goiter. Respiratory WNL. No retractions.. Cardiovascular Pedal Pulses WNL. No clubbing, cyanosis or edema. Lymphatic No adneopathy. No adenopathy. No adenopathy. HARTLEY, HISLE ([id number]) Musculoskeletal Adexa without tenderness or enlargement.. Digits and  nails w/o clubbing, cyanosis, infection, petechiae, ischemia, or inflammatory conditions.Marland Kitchen Psychiatric Judgement and insight Intact.. No evidence of depression, anxiety, or agitation.. General Notes: the patient has some subcutaneous debris and some surrounding callus and with a curette, forceps and scissors I have removed as much of necrotic debris as possible and the edges have some healthy granulation tissue. There is no bleeding. Integumentary (Hair, Skin) No suspicious lesions. No crepitus or fluctuance. No peri-wound warmth or erythema. No masses.. Wound #1 status is Open. Original cause of wound was Gradually Appeared. The wound is located on the Left,Medial Metatarsal head first. The wound measures 1.8cm length x 1.9cm width x 0.1cm depth; 2.686cm^2 area and 0.269cm^3 volume. The wound is limited to skin breakdown. There is no tunneling or undermining noted.  There is a medium amount of serosanguineous drainage noted. The wound margin is flat and intact. There is no granulation within the wound bed. There is a large (67-100%) amount of necrotic tissue within the wound bed including Eschar and Adherent Slough. The periwound skin appearance exhibited: Callus, Localized Edema, Dry/Scaly, Erythema. The periwound skin appearance did not exhibit: Crepitus, Excoriation, Fluctuance, Friable, Induration, Rash, Scarring, Maceration, Moist, Atrophie Blanche, Cyanosis, Ecchymosis, Hemosiderin Staining, Mottled, Pallor, Rubor. The surrounding wound skin color is noted with erythema which is circumferential. Periwound temperature was noted as No Abnormality. The periwound has tenderness on palpation. Assessment Active Problems ICD-10 E11.621 - Type 2 diabetes mellitus with foot ulcer I70.245 - Atherosclerosis of native arteries of left leg with ulceration of other part of foot L97.522 - Non-pressure chronic ulcer of other part of left foot with fat layer exposed Procedures Wound #1 Wound #1 is a Diabetic Wound/Ulcer of the Lower Extremity located on the Left,Medial Metatarsal head first . There was a Skin/Subcutaneous Tissue Debridement [id number]) debridement with total area of Thain, Jeven N. ([id number]) 3.42 sq cm performed by Christin Fudge, MD. with the following instrument(s): Curette, Forceps, and Scissors to remove Viable and Non-Viable tissue/material including Exudate, Fibrin/Slough, Eschar, and Subcutaneous after achieving pain control using Lidocaine 4% Topical Solution. A time out was conducted prior to the start of the procedure. A Minimum amount of bleeding was controlled with Pressure. The procedure was tolerated well with a pain level of 0 throughout and a pain level of 0 following the procedure. Post Debridement Measurements: 1.8cm length x 1.9cm width x 0.2cm depth; 0.537cm^3 volume. Post procedure Diagnosis Wound #1: Same as  Pre-Procedure Plan Wound Cleansing: Wound #1 Left,Medial Metatarsal head first: Clean wound with Normal Saline. Cleanse wound with mild soap and water Anesthetic: Wound #1 Left,Medial Metatarsal head first: Topical Lidocaine 4% cream applied to wound bed prior to debridement Primary Wound Dressing: Wound #1 Left,Medial Metatarsal head first: Santyl Ointment Secondary Dressing: Wound #1 Left,Medial Metatarsal head first: Gauze and Kerlix/Conform Dressing Change Frequency: Wound #1 Left,Medial Metatarsal head first: Change dressing every day. Follow-up Appointments: Wound #1 Left,Medial Metatarsal head first: Return Appointment in 1 week. Additional Orders / Instructions: Wound #1 Left,Medial Metatarsal head first: Increase protein intake. Other: - work to keep blood sugars under 180 consistently I have recommended: 1. Santyl ointment locally with a bordered foam to be changed daily 2. continue with appropriate exercise as tolerated 3. Reviewing the wound care center for further management next week. KOVIN, DEMMA ([id number]) Electronic Signature(s) Signed: 03/07/2016 2:00:32 PM By: Christin Fudge MD, FACS Entered By: Christin Fudge on 03/07/2016 14:00:31 [name] Small ([id number]) -------------------------------------------------------------------------------- SuperBill Details Patient Name: [name] Small Date of Service: 03/07/2016 Medical Record  Number: [id number] Patient Account Number: [id number] Date of Birth/Sex: 1928-06-26 (82 y.o. Male) Treating RN: Ahmed Prima Primary Care Physician: [name] Other Clinician: Referring Physician: [name] Treating Physician/Extender: Frann Rider in Treatment: 4 Diagnosis Coding ICD-10 Codes Code Description E11.621 Type 2 diabetes mellitus with foot ulcer I70.245 Atherosclerosis of native arteries of left leg with ulceration of other part of foot L97.522 Non-pressure chronic ulcer of other part  of left foot with fat layer exposed Facility Procedures CPT4: Description Modifier Quantity Code [id number] - DEB SUBQ TISSUE 20 SQ CM/< 1 ICD-10 Description Diagnosis E11.621 Type 2 diabetes mellitus with foot ulcer I70.245 Atherosclerosis of native arteries of left leg with ulceration of other part of  foot L97.522 Non-pressure chronic ulcer of other part of left foot with fat layer exposed Physician Procedures CPT4: Description Modifier Quantity Code [id number] - WC PHYS SUBQ TISS 20 SQ CM 1 ICD-10 Description Diagnosis E11.621 Type 2 diabetes mellitus with foot ulcer I70.245 Atherosclerosis of native arteries of left leg with ulceration of other part of  foot L97.522 Non-pressure chronic ulcer of other part of left foot with fat layer exposed Electronic Signature(s) Signed: 03/07/2016 2:00:44 PM By: Christin Fudge MD, FACS Entered By: Christin Fudge on 03/07/2016 14:00:44

## 2016-03-14 ENCOUNTER — Ambulatory Visit
Admission: RE | Admit: 2016-03-14 | Discharge: 2016-03-14 | Disposition: A | Discharge status: Home / Self Care | Payer: Medicare Other | Source: Ambulatory Visit | Attending: Surgery | Admitting: Surgery

## 2016-03-14 ENCOUNTER — Encounter: Payer: Medicare Other | Attending: Surgery | Admitting: Surgery

## 2016-03-14 ENCOUNTER — Other Ambulatory Visit: Payer: Self-pay | Admitting: Surgery

## 2016-03-14 DIAGNOSIS — E1140 Type 2 diabetes mellitus with diabetic neuropathy, unspecified: Secondary | ICD-10-CM | POA: Insufficient documentation

## 2016-03-14 DIAGNOSIS — Z87891 Personal history of nicotine dependence: Secondary | ICD-10-CM | POA: Insufficient documentation

## 2016-03-14 DIAGNOSIS — L97521 Non-pressure chronic ulcer of other part of left foot limited to breakdown of skin: Secondary | ICD-10-CM | POA: Diagnosis not present

## 2016-03-14 DIAGNOSIS — E11621 Type 2 diabetes mellitus with foot ulcer: Secondary | ICD-10-CM | POA: Insufficient documentation

## 2016-03-14 DIAGNOSIS — S81802A Unspecified open wound, left lower leg, initial encounter: Secondary | ICD-10-CM

## 2016-03-14 DIAGNOSIS — Z794 Long term (current) use of insulin: Secondary | ICD-10-CM | POA: Diagnosis not present

## 2016-03-14 DIAGNOSIS — N183 Chronic kidney disease, stage 3 (moderate): Secondary | ICD-10-CM | POA: Insufficient documentation

## 2016-03-14 DIAGNOSIS — Z8551 Personal history of malignant neoplasm of bladder: Secondary | ICD-10-CM | POA: Insufficient documentation

## 2016-03-14 DIAGNOSIS — L97522 Non-pressure chronic ulcer of other part of left foot with fat layer exposed: Secondary | ICD-10-CM | POA: Diagnosis not present

## 2016-03-14 DIAGNOSIS — J449 Chronic obstructive pulmonary disease, unspecified: Secondary | ICD-10-CM | POA: Diagnosis not present

## 2016-03-14 DIAGNOSIS — I2510 Atherosclerotic heart disease of native coronary artery without angina pectoris: Secondary | ICD-10-CM | POA: Diagnosis not present

## 2016-03-14 DIAGNOSIS — Z992 Dependence on renal dialysis: Secondary | ICD-10-CM | POA: Diagnosis not present

## 2016-03-14 DIAGNOSIS — L97529 Non-pressure chronic ulcer of other part of left foot with unspecified severity: Secondary | ICD-10-CM | POA: Diagnosis not present

## 2016-03-14 DIAGNOSIS — M869 Osteomyelitis, unspecified: Secondary | ICD-10-CM

## 2016-03-14 DIAGNOSIS — I739 Peripheral vascular disease, unspecified: Secondary | ICD-10-CM | POA: Insufficient documentation

## 2016-03-14 DIAGNOSIS — I129 Hypertensive chronic kidney disease with stage 1 through stage 4 chronic kidney disease, or unspecified chronic kidney disease: Secondary | ICD-10-CM | POA: Diagnosis not present

## 2016-03-14 DIAGNOSIS — I70245 Atherosclerosis of native arteries of left leg with ulceration of other part of foot: Secondary | ICD-10-CM | POA: Insufficient documentation

## 2016-03-15 ENCOUNTER — Ambulatory Visit: Payer: Medicare Other | Admitting: Cardiovascular Disease

## 2016-03-16 NOTE — Progress Notes (Signed)
DEZMON, TORONTO ([id number]) Visit Report for 03/14/2016 Arrival Information Details Patient Name: [name] Small, [name] Small. Date of Service: 03/14/2016 12:45 PM Medical Record Number: [id number] Patient Account Number: [id number] Date of Birth/Sex: 1928/01/02 (82 y.o. Male) Treating RN: Cornell Barman Primary Care Physician: [name] Other Clinician: Referring Physician: [name] Treating Physician/Extender: Frann Rider in Treatment: 5 Visit Information History Since Last Visit Added or deleted any medications: No Patient Arrived: Ambulatory Any new allergies or adverse reactions: No Arrival Time: 12:46 Had a fall or experienced change in No Accompanied By: wife activities of daily living that may affect Transfer Assistance: None risk of falls: Patient Identification Verified: Yes Signs or symptoms of abuse/neglect since last No Secondary Verification Process Yes visito Completed: Hospitalized since last visit: No Patient Requires Transmission-Based No Has Dressing in Place as Prescribed: Yes Precautions: Pain Present Now: No Patient Has Alerts: Yes Patient Alerts: DMII Electronic Signature(s) Signed: 03/14/2016 5:01:36 PM By: Regan Lemming BSN, RN Entered By: Regan Lemming on 03/14/2016 13:15:11 [name] Small ([id number]) -------------------------------------------------------------------------------- Encounter Discharge Information Details Patient Name: [name] Small Date of Service: 03/14/2016 12:45 PM Medical Record Number: [id number] Patient Account Number: [id number] Date of Birth/Sex: September 29, 1928 (82 y.o. Male) Treating RN: Montey Hora Primary Care Physician: [name] Other Clinician: Referring Physician: [name] Treating Physician/Extender: Frann Rider in Treatment: 5 Encounter Discharge Information Items Discharge Pain Level: 0 Discharge Condition: Stable Ambulatory Status: Ambulatory Discharge Destination:  Home Transportation: Private Auto Accompanied By: wife Schedule Follow-up Appointment: Yes Medication Reconciliation completed and provided to Patient/Care Yes [name] Small: Provided on Clinical Summary of Care: 03/14/2016 Form Type Recipient Paper Patient RB Electronic Signature(s) Signed: 03/14/2016 5:01:36 PM By: Regan Lemming BSN, RN Previous Signature: 03/14/2016 1:15:27 PM Version By: Ruthine Dose Entered By: Regan Lemming on 03/14/2016 13:16:38 [name] Small ([id number]) -------------------------------------------------------------------------------- Lower Extremity Assessment Details Patient Name: [name] Small Date of Service: 03/14/2016 12:45 PM Medical Record Number: [id number] Patient Account Number: [id number] Date of Birth/Sex: [birth year] (82 y.o. Male) Treating RN: Cornell Barman Primary Care Physician: [name] Other Clinician: Referring Physician: [name] Treating Physician/Extender: Frann Rider in Treatment: 5 Vascular Assessment Pulses: Posterior Tibial Dorsalis Pedis Palpable: [Left:Yes] Extremity colors, hair growth, and conditions: Extremity Color: [Left:Normal] Hair Growth on Extremity: [Left:No] Temperature of Extremity: [Left:Warm] Capillary Refill: [Left:< 3 seconds] Toe Nail Assessment Left: Right: Thick: Yes Discolored: Yes Deformed: No Improper Length and Hygiene: No Electronic Signature(s) Signed: 03/15/2016 5:12:23 PM By: Gretta Cool, RN, BSN, Kim RN, BSN Entered By: Gretta Cool, RN, BSN, Kim on 03/14/2016 12:53:37 [name] Small ([id number]) -------------------------------------------------------------------------------- Multi Wound Chart Details Patient Name: [name] Small Date of Service: 03/14/2016 12:45 PM Medical Record Number: [id number] Patient Account Number: [id number] Date of Birth/Sex: December 25, 1927 (82 y.o. Male) Treating RN: Baruch Gouty, RN, BSN, Velva Harman Primary Care Physician: [name] Other Clinician: Referring Physician:  [name] Treating Physician/Extender: Frann Rider in Treatment: 5 Vital Signs Height(in): 74 Pulse(bpm): 77 Weight(lbs): 217 Blood Pressure 93/50 (mmHg): Body Mass Index(BMI): 28 Temperature(F): 98.1 Respiratory Rate 16 (breaths/min): Photos: [1:No Photos] [N/A:N/A] Wound Location: [1:Left Metatarsal head first - N/A Medial] Wounding Event: [1:Gradually Appeared] [N/A:N/A] Primary Etiology: [1:Diabetic Wound/Ulcer of N/A the Lower Extremity] Comorbid History: [1:Chronic Obstructive Pulmonary Disease (COPD), Coronary Artery Disease, Hypertension, Peripheral Arterial Disease, Type II Diabetes, Neuropathy] [N/A:N/A] Date Acquired: [1:01/15/2016] [N/A:N/A] Weeks of Treatment: [1:5] [N/A:N/A] Wound Status: [1:Open] [N/A:N/A] Pending Amputation on Yes [N/A:N/A] Presentation: Measurements L x W x D 2.5x2.2x0.2 [N/A:N/A] (cm) Area (cm) : [1:4.32] [N/A:N/A] Volume (cm) : [  1:0.864] [N/A:N/A] % Reduction in Area: [1:-266.70%] [N/A:N/A] % Reduction in Volume: -266.10% [N/A:N/A] Classification: [1:Grade 2] [N/A:N/A] Exudate Amount: [1:Medium] [N/A:N/A] Exudate Type: [1:Serosanguineous] [N/A:N/A] Exudate Color: [1:red, brown] [N/A:N/A] Foul Odor After [1:Yes] [N/A:N/A] Cleansing: [name] Small, [name] Small ([id number]) Odor Anticipated Due to No N/A N/A Product Use: Wound Margin: Flat and Intact N/A N/A Granulation Amount: None Present (0%) N/A N/A Necrotic Amount: Large (67-100%) N/A N/A Necrotic Tissue: Eschar, Adherent Slough N/A N/A Exposed Structures: Fascia: No N/A N/A Fat: No Tendon: No Muscle: No Joint: No Bone: No Limited to Skin Breakdown Epithelialization: None N/A N/A Periwound Skin Texture: Edema: Yes N/A N/A Callus: Yes Excoriation: No Induration: No Crepitus: No Fluctuance: No Friable: No Rash: No Scarring: No Periwound Skin Dry/Scaly: Yes N/A N/A Moisture: Maceration: No Moist: No Periwound Skin Color: Erythema: Yes N/A N/A Atrophie  Blanche: No Cyanosis: No Ecchymosis: No Hemosiderin Staining: No Mottled: No Pallor: No Rubor: No Erythema Location: Circumferential N/A N/A Temperature: No Abnormality N/A N/A Tenderness on Yes N/A N/A Palpation: Wound Preparation: Ulcer Cleansing: N/A N/A Rinsed/Irrigated with Saline Topical Anesthetic Applied: Other: lidocaine 4% Treatment Notes [name] Small, [name] Small ([id number]) Electronic Signature(s) Signed: 03/14/2016 5:01:36 PM By: Regan Lemming BSN, RN Entered By: Regan Lemming on 03/14/2016 13:00:44 [name] Small ([id number]) -------------------------------------------------------------------------------- Multi-Disciplinary Care Plan Details Patient Name: [name] Small Date of Service: 03/14/2016 12:45 PM Medical Record Number: [id number] Patient Account Number: [id number] Date of Birth/Sex: 1928/07/16 (82 y.o. Male) Treating RN: Baruch Gouty, RN, BSN, Velva Harman Primary Care Physician: [name] Other Clinician: Referring Physician: [name] Treating Physician/Extender: Frann Rider in Treatment: 5 Active Inactive Abuse / Safety / Falls / Self Care Management Nursing Diagnoses: Potential for falls Goals: Patient will remain injury free Date Initiated: 02/08/2016 Goal Status: Active Interventions: Assess fall risk on admission and as needed Notes: Orientation to the Wound Care Program Nursing Diagnoses: Knowledge deficit related to the wound healing center program Goals: Patient/caregiver will verbalize understanding of the [facility] Date Initiated: 02/08/2016 Goal Status: Active Interventions: Provide education on orientation to the wound center Notes: Wound/Skin Impairment Nursing Diagnoses: Impaired tissue integrity Goals: Patient/caregiver will verbalize understanding of skin care regimen Date Initiated: 02/08/2016 [name] Small ([id number]) Goal Status: Active Ulcer/skin breakdown will have a volume reduction of 30% by  week 4 Date Initiated: 02/08/2016 Goal Status: Active Ulcer/skin breakdown will have a volume reduction of 50% by week 8 Date Initiated: 02/08/2016 Goal Status: Active Ulcer/skin breakdown will have a volume reduction of 80% by week 12 Date Initiated: 02/08/2016 Goal Status: Active Ulcer/skin breakdown will heal within 14 weeks Date Initiated: 02/08/2016 Goal Status: Active Interventions: Assess patient/caregiver ability to obtain necessary supplies Assess patient/caregiver ability to perform ulcer/skin care regimen upon admission and as needed Assess ulceration(s) every visit Notes: Electronic Signature(s) Signed: 03/14/2016 5:01:36 PM By: Regan Lemming BSN, RN Entered By: Regan Lemming on 03/14/2016 13:00:14 [name] Small ([id number]) -------------------------------------------------------------------------------- Pain Assessment Details Patient Name: [name] Small Date of Service: 03/14/2016 12:45 PM Medical Record Number: [id number] Patient Account Number: [id number] Date of Birth/Sex: Jun 18, 1928 (82 y.o. Male) Treating RN: Cornell Barman Primary Care Physician: [name] Other Clinician: Referring Physician: [name] Treating Physician/Extender: Frann Rider in Treatment: 5 Active Problems Location of Pain Severity and Description of Pain Patient Has Paino No Site Locations Pain Management and Medication Current Pain Management: Notes pain in bottoms of both feet Electronic Signature(s) Signed: 03/15/2016 5:12:23 PM By: Gretta Cool, RN, BSN, Kim RN, BSN Entered By: Gretta Cool, RN, BSN, Kim  on 03/14/2016 12:49:10 [name] Small, [name] Small ([id number]) -------------------------------------------------------------------------------- Patient/Caregiver Education Details Patient Name: [name] Small, GRAYER. Date of Service: 03/14/2016 12:45 PM Medical Record Number: [id number] Patient Account Number: [id number] Date of Birth/Gender: Feb 22, 1928 (82 y.o. Male) Treating RN: Baruch Gouty, RN, BSN,  Velva Harman Primary Care Physician: [name] Other Clinician: Referring Physician: [name] Treating Physician/Extender: Frann Rider in Treatment: 5 Education Assessment Education Provided To: Patient Education Topics Provided Wound/Skin Impairment: Handouts: Caring for Your Ulcer, Other: continue wound care as prescribed Methods: Demonstration Responses: State content correctly Electronic Signature(s) Signed: 03/14/2016 5:01:36 PM By: Regan Lemming BSN, RN Entered By: Regan Lemming on 03/14/2016 13:16:59 [name] Small ([id number]) -------------------------------------------------------------------------------- Wound Assessment Details Patient Name: [name] Small Date of Service: 03/14/2016 12:45 PM Medical Record Number: [id number] Patient Account Number: [id number] Date of Birth/Sex: [birth year] (82 y.o. Male) Treating RN: Cornell Barman Primary Care Physician: [name] Other Clinician: Referring Physician: [name] Treating Physician/Extender: Frann Rider in Treatment: 5 Wound Status Wound Number: 1 Primary Diabetic Wound/Ulcer of the Lower Etiology: Extremity Wound Location: Left Metatarsal head first - Medial Wound Open Status: Wounding Event: Gradually Appeared Comorbid Chronic Obstructive Pulmonary Disease Date Acquired: 01/15/2016 History: (COPD), Coronary Artery Disease, Weeks Of Treatment: 5 Hypertension, Peripheral Arterial Clustered Wound: No Disease, Type II Diabetes, Neuropathy Pending Amputation On Presentation Photos Photo Uploaded By: Gretta Cool, RN, BSN, Kim on 03/14/2016 14:01:08 Wound Measurements Length: (cm) 2.5 Width: (cm) 2.2 Depth: (cm) 0.2 Area: (cm) 4.32 Volume: (cm) 0.864 % Reduction in Area: -266.7% % Reduction in Volume: -266.1% Epithelialization: None Tunneling: No Undermining: No Wound Description Classification: Grade 2 Wound Margin: Flat and Intact Exudate Amount: Medium Exudate Type:  Serosanguineous Exudate Color: red, brown Foul Odor After Cleansing: Yes Due to Product Use: No Wound Bed Granulation Amount: None Present (0%) Exposed Structure Necrotic Amount: Large (67-100%) Fascia Exposed: No COREN, [name] Small ([id number]) Necrotic Quality: Eschar, Adherent Slough Fat Layer Exposed: No Tendon Exposed: No Muscle Exposed: No Joint Exposed: No Bone Exposed: No Limited to Skin Breakdown Periwound Skin Texture Texture Color No Abnormalities Noted: No No Abnormalities Noted: No Callus: Yes Atrophie Blanche: No Crepitus: No Cyanosis: No Excoriation: No Ecchymosis: No Fluctuance: No Erythema: Yes Friable: No Erythema Location: Circumferential Induration: No Hemosiderin Staining: No Localized Edema: Yes Mottled: No Rash: No Pallor: No Scarring: No Rubor: No Moisture Temperature / Pain No Abnormalities Noted: No Temperature: No Abnormality Dry / Scaly: Yes Tenderness on Palpation: Yes Maceration: No Moist: No Wound Preparation Ulcer Cleansing: Rinsed/Irrigated with Saline Topical Anesthetic Applied: Other: lidocaine 4%, Treatment Notes Wound #1 (Left, Medial Metatarsal head first) 1. Cleansed with: Clean wound with Normal Saline 2. Anesthetic Topical Lidocaine 4% cream to wound bed prior to debridement 4. Dressing Applied: Santyl Ointment 5. Secondary Dressing Applied Gauze and Kerlix/Conform Electronic Signature(s) Signed: 03/15/2016 5:12:23 PM By: Gretta Cool, RN, BSN, Kim RN, BSN Entered By: Gretta Cool, RN, BSN, Kim on 03/14/2016 12:54:40 [name] Small ([id number]) -------------------------------------------------------------------------------- Angelina Details Patient Name: [name] Small Date of Service: 03/14/2016 12:45 PM Medical Record Number: [id number] Patient Account Number: [id number] Date of Birth/Sex: Apr 15, 1928 (82 y.o. Male) Treating RN: Cornell Barman Primary Care Physician: [name] Other Clinician: Referring Physician:  [name] Treating Physician/Extender: Frann Rider in Treatment: 5 Vital Signs Time Taken: 12:49 Temperature (F): 98.1 Height (in): 74 Pulse (bpm): 77 Weight (lbs): 217 Respiratory Rate (breaths/min): 16 Body Mass Index (BMI): 27.9 Blood Pressure (mmHg): 93/50 Reference Range: 80 - 120 mg / dl Electronic Signature(s) Signed: 03/15/2016 5:12:23 PM By:  Gretta Cool, RN, BSN, Leisure centre manager, BSN Entered By: Gretta Cool, RN, BSN, Kim on 03/14/2016 12:49:30

## 2016-03-18 ENCOUNTER — Ambulatory Visit (INDEPENDENT_AMBULATORY_CARE_PROVIDER_SITE_OTHER): Payer: Medicare Other | Admitting: Internal Medicine

## 2016-03-18 ENCOUNTER — Encounter: Payer: Self-pay | Admitting: Internal Medicine

## 2016-03-18 VITALS — BP 110/60 | HR 84 | Temp 97.9°F

## 2016-03-18 DIAGNOSIS — F39 Unspecified mood [affective] disorder: Secondary | ICD-10-CM

## 2016-03-18 DIAGNOSIS — R531 Weakness: Secondary | ICD-10-CM | POA: Insufficient documentation

## 2016-03-18 DIAGNOSIS — E0840 Diabetes mellitus due to underlying condition with diabetic neuropathy, unspecified: Secondary | ICD-10-CM

## 2016-03-18 DIAGNOSIS — I5032 Chronic diastolic (congestive) heart failure: Secondary | ICD-10-CM

## 2016-03-18 DIAGNOSIS — Z794 Long term (current) use of insulin: Secondary | ICD-10-CM

## 2016-03-18 NOTE — Assessment & Plan Note (Signed)
Sugars have been okay without apparent hypoglycemia Neuropathy is bad--and a big part of his issues Will have him continue low dose gabapentin Tylenol regularly Consider other meds---??tramadol

## 2016-03-18 NOTE — Assessment & Plan Note (Signed)
Slightly more SOB lately--uses the oxygen more But not really exacerbated No med changes for this

## 2016-03-18 NOTE — Assessment & Plan Note (Signed)
In legs--- may be more related to pain and neuropathy (neuropathic ataxia) Will make referral for home PT Probably needs walker for stability--will let PT make that decision For now--he needs to use the cane

## 2016-03-18 NOTE — Patient Instructions (Signed)
Please give tylenol 1000mg  three times daily to see if that helps pain. Let Dr Damita Dunnings know if there is ongoing severe pain.

## 2016-03-18 NOTE — Progress Notes (Signed)
Pre visit review using our clinic review tool, if applicable. No additional management support is needed unless otherwise documented below in the visit note. 

## 2016-03-18 NOTE — Assessment & Plan Note (Signed)
Mostly dysthymia with pain and limited functional status Not MDD--will hold off on meds for now

## 2016-03-18 NOTE — Progress Notes (Signed)
Subjective:    Patient ID: [name] Small, male    DOB: 09-01-1928, 82 y.o.   MRN: [id number]  HPI Here with wife and daughter Having ongoing problems with feet No balance---"I can't get a good footing"  They hurt all the time Doesn't feel well--- has affected his appetite  Can awaken him at night  Weakness--- collapsed in wife's arms coming in today Checks his sugars regularly-- usually close to 100 No apparent clinical hypoglycemia  Recent angioplasty---hasn't felt good since Pain in feet goes back for years No help in pain after angioplasty Has chronic ulcer on left foot--- may be improving some since the angioplasty  Takes shower on his own Wife had to help him dress today though Uses bathroom himself Holds onto furniture at home--doesn't use cane in house  Current Outpatient Prescriptions on File Prior to Visit  Medication Sig Dispense Refill  . acetaminophen (TYLENOL) 500 MG tablet Take 1,000 mg by mouth every 8 (eight) hours as needed for mild pain, moderate pain or headache.    Marland Kitchen amLODipine (NORVASC) 5 MG tablet Take 1 tablet (5 mg total) by mouth daily.    Marland Kitchen atorvastatin (LIPITOR) 10 MG tablet Take 1 tablet (10 mg total) by mouth daily. 90 tablet 3  . budesonide-formoterol (SYMBICORT) 160-4.5 MCG/ACT inhaler Inhale 2 puffs into the lungs 2 (two) times daily as needed.    . clobetasol (TEMOVATE) 0.05 % external solution Apply 1 application topically daily.    . clopidogrel (PLAVIX) 75 MG tablet Take 1 tablet by mouth  daily 90 tablet 1  . collagenase (SANTYL) ointment Apply 1 application topically daily.    . furosemide (LASIX) 40 MG tablet TAKE 1 TABLET BY MOUTH  EVERY MONDAY, WEDNESDAY,  AND FRIDAY 45 tablet 1  . glipiZIDE (GLUCOTROL XL) 5 MG 24 hr tablet Take 1 tablet by mouth  daily with breakfast 90 tablet 1  . insulin glargine (LANTUS) 100 UNIT/ML injection Inject 25 Units into the skin daily at 3 pm.     No current facility-administered medications on file  prior to visit.    Allergies  Allergen Reactions  . Actos [Pioglitazone Hydrochloride] Other (See Comments)    Held 2012 bladder cancer  . Aspirin Other (See Comments)    Held 2012 due to hematuria and bruising.   . Ace Inhibitors Cough  . Gabapentin     Upset stomach and diarrhea - tolerates low doses  . Lyrica [Pregabalin] Other (See Comments)    swelling  . Pravastatin Other (See Comments)    Swelling of feet  . Bactrim Rash and Other (See Comments)    Rash, presumed allergy  . Sulfa Drugs Cross Reactors Rash    Past Medical History  Diagnosis Date  . BENIGN PROSTATIC HYPERTROPHY 05/21/2007  . DIVERTICULOSIS, COLON 05/21/2007    pt denies  . HYPERTENSION 05/21/2007  . PSORIASIS, SCALP 10/25/2008  . Cellulitis of left foot     Hospitalized in 2006  . Gross hematuria     pt denies  . Psoriasis ELBOWS  . Fatigue AGE-RELATED  . CAD (coronary artery disease) CARDIOLOGIST- DR WALL - VISIT IN JUNE 2012 FOR CATH    nonobstructive by cath 6/12:  mid LAD 30%, proximal obtuse marginal-2 30%, proximal RCA 20%, mid RCA 30-40%.  He had normal cardiac output and mildly elevated filling pressures but no significant pulmonary hypertension;   Echocardiogram in May 2012 demonstrated EF 50-55% and left atrial enlargement   . Impaired hearing BILATERAL HEARING AIDS  .  Back pain     s/p lumbar injection 2014  . Chronic kidney disease (CKD), stage III (moderate) 12/04/2007    FOLLOWED BY DR PATEL  . PAD (peripheral artery disease) ([place])   . On home oxygen therapy     "2L at nighttime" (02/28/2016)  . DIABETES MELLITUS, TYPE II 05/21/2007  . Arthritis   . Chronic pain of lower extremity   . Bladder cancer ([place]) 10/2011    Past Surgical History  Procedure Laterality Date  . Vasectomy  1990s  . Penile prosthesis implant  1990s  . Cardiovascular stress test  2007  . Cardiac catheterization  04-24-11/  DR MUHAMMAD ARIDA    MILD NONOBSTRUCTIVE CAD, NORMA CARDIAC OUTPUT  . Appendectomy  1941   . Tonsillectomy and adenoidectomy  1962  . Cataract extraction w/ intraocular lens  implant, bilateral Bilateral ~ 2010  . Incision and drainage foot Left ~ 2005    LEFT FOOT DUE TO INFECTION FROM  NAIL PUNCTURE INJURY  . Cystoscopy w/ retrogrades  10/30/2011    Procedure: CYSTOSCOPY WITH RETROGRADE PYELOGRAM;  Surgeon: Molli Hazard, MD;  Location: [facility];  Service: Urology;  Laterality: Bilateral;  CYSTOSCOPY POSS TURBT BILATERAL RETROGRADE PYLEOGRAM   . Transurethral resection of bladder tumor  10/30/2011    Procedure: TRANSURETHRAL RESECTION OF BLADDER TUMOR (TURBT);  Surgeon: Molli Hazard, MD;  Location: [facility];  Service: Urology;  Laterality: N/A;  . Cystoscopy  12/25/2011    Procedure: CYSTOSCOPY;  Surgeon: Molli Hazard, MD;  Location: [facility];  Service: Urology;  Laterality: N/A;  needs intubation and to be paralyzed   . Transurethral resection of bladder tumor  12/25/2011    Procedure: TRANSURETHRAL RESECTION OF BLADDER TUMOR (TURBT);  Surgeon: Molli Hazard, MD;  Location: [facility];  Service: Urology;  Laterality: N/A;  need long gyrus instruments   . Cystoscopy w/ retrogrades  11/30/2012    Procedure: CYSTOSCOPY WITH RETROGRADE PYELOGRAM;  Surgeon: Molli Hazard, MD;  Location: [facility];  Service: Urology;  Laterality: Bilateral;  Flexible cystoscopy.  . Cystoscopy with biopsy  11/30/2012    Procedure: CYSTOSCOPY WITH BIOPSY;  Surgeon: Molli Hazard, MD;  Location: [facility];  Service: Urology;  Laterality: N/A;  . Peripheral vascular catheterization N/A 02/14/2016    Procedure: Abdominal Aortogram w/Lower Extremity;  Surgeon: Wellington Hampshire, MD;  Location: [facility];  Service: Cardiovascular;  Laterality: N/A;  . Peripheral vascular catheterization Left 02/28/2016    Procedure: Peripheral Vascular Balloon Angioplasty;   Surgeon: Wellington Hampshire, MD;  Location: [facility];  Service: Cardiovascular;  Laterality: Left;  left peroneal and popliteal artery    Family History  Problem Relation Age of Onset  . Diabetes Mother   . Heart disease Brother   . Heart disease Brother   . Heart disease Brother   . Heart disease Brother   . Heart disease Brother   . Heart disease Brother   . Lung cancer Brother     Social History   Social History  . Marital Status: Married    Spouse Name: N/A  . Number of Children: 4  . Years of Education: N/A   Occupational History  . retired      [employer]	[place].   Social History Main Topics  . Smoking status: Former Smoker -- 2.00 packs/day for 22 years    Types: Cigarettes    Quit date:  11/11/1968  . Smokeless tobacco: Never Used  . Alcohol Use: 1.2 oz/week    2 Glasses of wine per week  . Drug Use: No  . Sexual Activity: Not Currently   Other Topics Concern  . Not on file   Social History Narrative   Retired Event organiser.   Active with golf.   [place] [phone][place]     Review of Systems No chest pain Has oxygen for at night---using it more in the day now due to increased SOB. Has lost weight over the past few weeks-- weighs daily. At least 10# Is taking gabapentin in low dose--100mg  tid    Objective:   Physical Exam  Constitutional: He appears well-developed. No distress.  Neck: Normal range of motion. Neck supple.  Cardiovascular: Normal rate, regular rhythm and normal heart sounds.  Exam reveals no gallop.   No murmur heard. No pulses in feet Right foot is cold  Pulmonary/Chest: Effort normal and breath sounds normal. No respiratory distress. He has no wheezes. He has no rales.  Musculoskeletal:  No edema  Lymphadenopathy:    He has no cervical adenopathy.  Neurological:  Fairly normal strength in legs  Skin:  Shallow oval 2-3cm ulcer medial left foot--at great toe (MTP)  Psychiatric:  Fairly normal interaction No  clear depression but wife feels he may be          Assessment & Plan:

## 2016-03-21 ENCOUNTER — Ambulatory Visit: Payer: Medicare Other | Admitting: Cardiovascular Disease

## 2016-03-21 ENCOUNTER — Encounter: Payer: Medicare Other | Admitting: Surgery

## 2016-03-21 ENCOUNTER — Telehealth: Payer: Self-pay | Admitting: Podiatry

## 2016-03-21 ENCOUNTER — Other Ambulatory Visit
Admission: RE | Admit: 2016-03-21 | Discharge: 2016-03-21 | Disposition: A | Discharge status: Home / Self Care | Payer: Medicare Other | Source: Ambulatory Visit | Attending: Surgery | Admitting: Surgery

## 2016-03-21 DIAGNOSIS — L0889 Other specified local infections of the skin and subcutaneous tissue: Secondary | ICD-10-CM | POA: Insufficient documentation

## 2016-03-21 DIAGNOSIS — Z992 Dependence on renal dialysis: Secondary | ICD-10-CM | POA: Diagnosis not present

## 2016-03-21 DIAGNOSIS — Z794 Long term (current) use of insulin: Secondary | ICD-10-CM | POA: Diagnosis not present

## 2016-03-21 DIAGNOSIS — Z8551 Personal history of malignant neoplasm of bladder: Secondary | ICD-10-CM | POA: Diagnosis not present

## 2016-03-21 DIAGNOSIS — Z87891 Personal history of nicotine dependence: Secondary | ICD-10-CM | POA: Diagnosis not present

## 2016-03-21 DIAGNOSIS — L97521 Non-pressure chronic ulcer of other part of left foot limited to breakdown of skin: Secondary | ICD-10-CM | POA: Diagnosis not present

## 2016-03-21 DIAGNOSIS — L97522 Non-pressure chronic ulcer of other part of left foot with fat layer exposed: Secondary | ICD-10-CM | POA: Diagnosis not present

## 2016-03-21 DIAGNOSIS — I70245 Atherosclerosis of native arteries of left leg with ulceration of other part of foot: Secondary | ICD-10-CM | POA: Diagnosis not present

## 2016-03-21 DIAGNOSIS — N183 Chronic kidney disease, stage 3 (moderate): Secondary | ICD-10-CM | POA: Diagnosis not present

## 2016-03-21 DIAGNOSIS — S91302A Unspecified open wound, left foot, initial encounter: Secondary | ICD-10-CM | POA: Insufficient documentation

## 2016-03-21 DIAGNOSIS — J449 Chronic obstructive pulmonary disease, unspecified: Secondary | ICD-10-CM | POA: Diagnosis not present

## 2016-03-21 DIAGNOSIS — L97529 Non-pressure chronic ulcer of other part of left foot with unspecified severity: Secondary | ICD-10-CM | POA: Diagnosis not present

## 2016-03-21 DIAGNOSIS — I2510 Atherosclerotic heart disease of native coronary artery without angina pectoris: Secondary | ICD-10-CM | POA: Diagnosis not present

## 2016-03-21 DIAGNOSIS — E11621 Type 2 diabetes mellitus with foot ulcer: Secondary | ICD-10-CM | POA: Diagnosis not present

## 2016-03-21 DIAGNOSIS — E1140 Type 2 diabetes mellitus with diabetic neuropathy, unspecified: Secondary | ICD-10-CM | POA: Diagnosis not present

## 2016-03-21 DIAGNOSIS — I739 Peripheral vascular disease, unspecified: Secondary | ICD-10-CM | POA: Diagnosis not present

## 2016-03-21 DIAGNOSIS — I129 Hypertensive chronic kidney disease with stage 1 through stage 4 chronic kidney disease, or unspecified chronic kidney disease: Secondary | ICD-10-CM | POA: Diagnosis not present

## 2016-03-21 NOTE — Telephone Encounter (Signed)
lvm for pt to call me back to schedule an appt.

## 2016-03-21 NOTE — Progress Notes (Signed)
JEFRY, KRUCKEBERG ([id number]) Visit Report for 03/21/2016 Chief Complaint Document Details Patient Name: [name] Small, [name] Small. Date of Service: 03/21/2016 1:30 PM Medical Record Number: [id number] Patient Account Number: [id number] Date of Birth/Sex: Nov 19, 1927 (82 y.o. Male) Treating RN: Montey Hora Primary Care Physician: [name] Other Clinician: Referring Physician: [name] Treating Physician/Extender: Frann Rider in Treatment: 6 Information Obtained from: Patient Chief Complaint Patient presents to the wound care center today with an open arterial ulcer to the left medial forefoot and is also known to be a diabetic and has had these problems at least for 6 weeks Electronic Signature(s) Signed: 03/21/2016 2:17:17 PM By: Christin Fudge MD, FACS Previous Signature: 03/21/2016 1:59:14 PM Version By: Christin Fudge MD, FACS Entered By: Christin Fudge on 03/21/2016 14:17:16 [name] Small ([id number]) -------------------------------------------------------------------------------- HPI Details Patient Name: [name] Small Date of Service: 03/21/2016 1:30 PM Medical Record Number: [id number] Patient Account Number: [id number] Date of Birth/Sex: 1928/05/22 (82 y.o. Male) Treating RN: Montey Hora Primary Care Physician: [name] Other Clinician: Referring Physician: [name] Treating Physician/Extender: Frann Rider in Treatment: 6 History of Present Illness Location: ulcerated area on the left medial foot Quality: Patient reports rest pain to affected area(s). he also has typical symptoms of claudication Severity: Patient states wound are getting worse. Duration: Patient has had the wound for < 6 weeks prior to presenting for treatment Timing: Pain in wound is constant (hurts all the time) Context: The wound occurred when the patient patient off of a callus or scab from that area and noticed the ulceration Modifying Factors: Other  treatment(s) tried include:hydrogen peroxide and local ointments Associated Signs and Symptoms: Patient reports having:rest pain and typical pain of claudication HPI Description: 82 year old gentleman who was seen by his PCP recently having had a injury to the left medial foot after he peeled of a callus. He has been using clobetasol and peroxide. He's had a history of having peripheral arterial disease in both lower extremities. On inquiring with the patient he has clear symptoms of claudication and has had this for over a year and a half. Past medical history significant for diabetes mellitus type 2, BPH, diverticulosis, hypertension, psoriasis, cellulitis of the left foot treated with hospitalization in 2006, coronary artery disease, bladder cancer in 2013, chronic kidney disease stage III, status post vasectomy,penile prosthesis, cardiac, appendectomy, tonsillectomy, incision and drainage of foot abscess, patient was recently started on Keflex for 10 days and referred to the wound clinic. Of note the patient was worked up with vascular tests in March 2016 and was referred to Dr. Gwendalyn Ege who saw him on 10/17/2015 and noted an ABI of 1.0 on the right and 0.71 on the left and there was mild nonobstructive bilateral SFA disease. The patient had a one-vessel runoff bilaterally below the knee and the patient had no lower extremity ulceration. due to the nature of the below knee arterial disease he recommended medical therapy including cilostazol and Plavix. the patient was also put on a small dose of atorvastatin 10 mg daily. the patient was reviewed again on 01/16/2016 and at that stage the ulcer on the left foot was not noted. Medical management was continued and he would be seen in follow-up in 6 months time. Addendum: -- I spoke to Dr. Inda Merlin Aredia's office, and the call was taken by his colleague Dr. Esmond Plants -- he discussed the case with me in detail and would have the patient  see Dr. Lovette Cliche on Monday when he was back from  vacation. 02/15/2016 -- o I had a call from Dr. Jacqulyn Cane who saw the patient today and agrees that the patient needs an intervention with possibly stenting or angioplasty. He is going to set him up for this. X-ray of the left foot IMPRESSION:No definite evidence of osteomyelitis or fracture. He was taken up for a procedure by Dr. Jacqulyn Cane on 02/14/2016 Conclusion :1. No significant aortoiliac disease.2. Mild diffuse nonobstructive disease affecting the left SFA. YOSHINORI, HUX ([id number]) 3. Occluded distal left popliteal artery with reconstitution via extensive collaterals into the proximal to mid peroneal artery which extends all the way down to the foot. This is the only patent vessel below the knee. Recommendations:The patient has extensive collaterals and there is a reasonable chance of being able to heal the ulcer without revascularization. I recommend wound care as being done. If there is no improvement within 2 weeks, then an endovascular attempt of opening the popliteal occlusion into the peroneal artery will be pursued. 02/29/2016 -- was seen by Dr. Jacqulyn Cane on 4/19 -- Successful angioplasty to the left popliteal and left peroneal arteries for total occlusion with establishment of in-line flow to the foot. 03/21/2016 -- x-ray of the left foot IMPRESSION: No evidence of osteomyelitis. Soft tissue ulcer medial to distal first metatarsal. Electronic Signature(s) Signed: 03/21/2016 2:17:59 PM By: Christin Fudge MD, FACS Previous Signature: 03/21/2016 1:59:59 PM Version By: Christin Fudge MD, FACS Entered By: Christin Fudge on 03/21/2016 14:17:58 [name] Small ([id number]) -------------------------------------------------------------------------------- Physical Exam Details Patient Name: [name] Small Date of Service: 03/21/2016 1:30 PM Medical Record Number: [id number] Patient Account Number: [id number] Date of  Birth/Sex: 12-27-1927 (82 y.o. Male) Treating RN: Montey Hora Primary Care Physician: [name] Other Clinician: Referring Physician: [name] Treating Physician/Extender: Frann Rider in Treatment: 6 Constitutional . Pulse regular. Respirations normal and unlabored. Afebrile. . Eyes Nonicteric. Reactive to light. Ears, Nose, Mouth, and Throat Lips, teeth, and gums WNL.Marland Kitchen Moist mucosa without lesions. Neck supple and nontender. No palpable supraclavicular or cervical adenopathy. Normal sized without goiter. Respiratory WNL. No retractions.. Cardiovascular Pedal Pulses WNL. No clubbing, cyanosis or edema. Lymphatic No adneopathy. No adenopathy. No adenopathy. Musculoskeletal Adexa without tenderness or enlargement.. Digits and nails w/o clubbing, cyanosis, infection, petechiae, ischemia, or inflammatory conditions.. Integumentary (Hair, Skin) No suspicious lesions. No crepitus or fluctuance. No peri-wound warmth or erythema. No masses.Marland Kitchen Psychiatric Judgement and insight Intact.. No evidence of depression, anxiety, or agitation.. Notes the patient's wound has a significant odor and on deep pressure there is some purulent fluid which extrudes and I have cultured this. There is significant necrotic debris and at this stage I believe he needs operative debridement in the OR Electronic Signature(s) Signed: 03/21/2016 2:19:30 PM By: Christin Fudge MD, FACS Entered By: Christin Fudge on 03/21/2016 14:19:29 [name] Small ([id number]) -------------------------------------------------------------------------------- Physician Orders Details Patient Name: [name] Small Date of Service: 03/21/2016 1:30 PM Medical Record Number: [id number] Patient Account Number: [id number] Date of Birth/Sex: [birth year] (82 y.o. Male) Treating RN: Cornell Barman Primary Care Physician: [name] Other Clinician: Referring Physician: [name] Treating Physician/Extender:  Frann Rider in Treatment: 6 Verbal / Phone Orders: Yes Clinician: Cornell Barman Read Back and Verified: Yes Diagnosis Coding ICD-10 Coding Code Description E11.621 Type 2 diabetes mellitus with foot ulcer I70.245 Atherosclerosis of native arteries of left leg with ulceration of other part of foot L97.522 Non-pressure chronic ulcer of other part of left foot with fat layer exposed Wound Cleansing Wound #1 Left,Medial Metatarsal head first   o Clean wound with Normal Saline. o Cleanse wound with mild soap and water Anesthetic Wound #1 Left,Medial Metatarsal head first o Topical Lidocaine 4% cream applied to wound bed prior to debridement Primary Wound Dressing Wound #1 Left,Medial Metatarsal head first o Santyl Ointment Secondary Dressing Wound #1 Left,Medial Metatarsal head first o Gauze and Kerlix/Conform Dressing Change Frequency Wound #1 Left,Medial Metatarsal head first o Change dressing every day. Follow-up Appointments Wound #1 Left,Medial Metatarsal head first o Return Appointment in 1 week. Additional Orders / Instructions Wound #1 Left,Medial Metatarsal head first o Increase protein intake. BOUBACAR, SHINER ([id number]) o Other: - work to keep blood sugars under 180 consistently [place] o Bacteria identified in Wound by Culture (MICRO) oooo LOINC Code: [id number] Convenience Name: Wound culture routine Patient Medications Allergies: Actos, aspirin, ACE Inhibitors, Lyrica, pravastatin, Bactrim, Sulfa (Sulfonamide Antibiotics) Notifications Medication Indication Start End Cipro 03/21/2016 DOSE 1 - oral 500 mg tablet - 1 tablet oral bid Electronic Signature(s) Signed: 03/21/2016 2:15:33 PM By: Christin Fudge MD, FACS Entered By: Christin Fudge on 03/21/2016 14:15:32 [name] Small  ([id number]) -------------------------------------------------------------------------------- Problem List Details Patient Name: [name] Small Date of Service: 03/21/2016 1:30 PM Medical Record Number: [id number] Patient Account Number: [id number] Date of Birth/Sex: [birth year] (82 y.o. Male) Treating RN: Montey Hora Primary Care Physician: [name] Other Clinician: Referring Physician: [name] Treating Physician/Extender: Frann Rider in Treatment: 6 Active Problems ICD-10 Encounter Code Description Active Date Diagnosis E11.621 Type 2 diabetes mellitus with foot ulcer 02/08/2016 Yes I70.245 Atherosclerosis of native arteries of left leg with ulceration 02/08/2016 Yes of other part of foot L97.522 Non-pressure chronic ulcer of other part of left foot with fat 02/08/2016 Yes layer exposed Inactive Problems Resolved Problems Electronic Signature(s) Signed: 03/21/2016 2:16:21 PM By: Christin Fudge MD, FACS Previous Signature: 03/21/2016 1:59:02 PM Version By: Christin Fudge MD, FACS Entered By: Christin Fudge on 03/21/2016 14:16:21 [name] Small ([id number]) -------------------------------------------------------------------------------- Progress Note Details Patient Name: [name] Small Date of Service: 03/21/2016 1:30 PM Medical Record Number: [id number] Patient Account Number: [id number] Date of Birth/Sex: 1928/07/05 (82 y.o. Male) Treating RN: Montey Hora Primary Care Physician: [name] Other Clinician: Referring Physician: [name] Treating Physician/Extender: Frann Rider in Treatment: 6 Subjective Chief Complaint Information obtained from Patient Patient presents to the wound care center today with an open arterial ulcer to the left medial forefoot and is also known to be a diabetic and has had these problems at least for 6 weeks History of Present Illness (HPI) The following HPI elements were documented for the patient's  wound: Location: ulcerated area on the left medial foot Quality: Patient reports rest pain to affected area(s). he also has typical symptoms of claudication Severity: Patient states wound are getting worse. Duration: Patient has had the wound for < 6 weeks prior to presenting for treatment Timing: Pain in wound is constant (hurts all the time) Context: The wound occurred when the patient patient off of a callus or scab from that area and noticed the ulceration Modifying Factors: Other treatment(s) tried include:hydrogen peroxide and local ointments Associated Signs and Symptoms: Patient reports having:rest pain and typical pain of claudication 82 year old gentleman who was seen by his PCP recently having had a injury to the left medial foot after he peeled of a callus. He has been using clobetasol and peroxide. He's had a history of having peripheral arterial disease in both lower extremities. On inquiring with the patient  he has clear symptoms of claudication and has had this for over a year and a half. Past medical history significant for diabetes mellitus type 2, BPH, diverticulosis, hypertension, psoriasis, cellulitis of the left foot treated with hospitalization in 2006, coronary artery disease, bladder cancer in 2013, chronic kidney disease stage III, status post vasectomy,penile prosthesis, cardiac, appendectomy, tonsillectomy, incision and drainage of foot abscess, patient was recently started on Keflex for 10 days and referred to the wound clinic. Of note the patient was worked up with vascular tests in March 2016 and was referred to Dr. Gwendalyn Ege who saw him on 10/17/2015 and noted an ABI of 1.0 on the right and 0.71 on the left and there was mild nonobstructive bilateral SFA disease. The patient had a one-vessel runoff bilaterally below the knee and the patient had no lower extremity ulceration. due to the nature of the below knee arterial disease he recommended medical  therapy including cilostazol and Plavix. the patient was also put on a small dose of atorvastatin 10 mg daily. the patient was reviewed again on 01/16/2016 and at that stage the ulcer on the left foot was not noted. Medical management was continued and he would be seen in follow-up in 6 months time. Addendum: -- I spoke to Dr. Inda Merlin Aredia's office, and the call was taken by his colleague Dr. Esmond Plants -- he discussed the case with me in detail and would have the patient see Dr. Lovette Cliche on Monday [place]. ([id number]) when he was back from vacation. 02/15/2016 -- I had a call from Dr. Jacqulyn Cane who saw the patient today and agrees that the patient needs an intervention with possibly stenting or angioplasty. He is going to set him up for this. X-ray of the left foot IMPRESSION:No definite evidence of osteomyelitis or fracture. He was taken up for a procedure by Dr. Jacqulyn Cane on 02/14/2016 Conclusion :1. No significant aortoiliac disease.2. Mild diffuse nonobstructive disease affecting the left SFA. 3. Occluded distal left popliteal artery with reconstitution via extensive collaterals into the proximal to mid peroneal artery which extends all the way down to the foot. This is the only patent vessel below the knee. Recommendations:The patient has extensive collaterals and there is a reasonable chance of being able to heal the ulcer without revascularization. I recommend wound care as being done. If there is no improvement within 2 weeks, then an endovascular attempt of opening the popliteal occlusion into the peroneal artery will be pursued. 02/29/2016 -- was seen by Dr. Jacqulyn Cane on 4/19 -- Successful angioplasty to the left popliteal and left peroneal arteries for total occlusion with establishment of in-line flow to the foot. 03/21/2016 -- x-ray of the left foot IMPRESSION: No evidence of osteomyelitis. Soft tissue ulcer medial to distal first  metatarsal. Objective Constitutional Pulse regular. Respirations normal and unlabored. Afebrile. Vitals Time Taken: 1:48 PM, Height: 74 in, Weight: 217 lbs, BMI: 27.9, Temperature: 98.2 F, Pulse: 72 bpm, Respiratory Rate: 16 breaths/min, Blood Pressure: 106/55 mmHg. Eyes Nonicteric. Reactive to light. Ears, Nose, Mouth, and Throat Lips, teeth, and gums WNL.Marland Kitchen Moist mucosa without lesions. Neck supple and nontender. No palpable supraclavicular or cervical adenopathy. Normal sized without goiter. Respiratory WNL. No retractions.. Cardiovascular Pedal Pulses WNL. No clubbing, cyanosis or edema. QUAY, MARSACK ([id number]) Lymphatic No adneopathy. No adenopathy. No adenopathy. Musculoskeletal Adexa without tenderness or enlargement.. Digits and nails w/o clubbing, cyanosis, infection, petechiae, ischemia, or inflammatory conditions.Marland Kitchen Psychiatric Judgement and insight Intact.. No evidence of depression, anxiety, or  agitation.. General Notes: the patient's wound has a significant odor and on deep pressure there is some purulent fluid which extrudes and I have cultured this. There is significant necrotic debris and at this stage I believe he needs operative debridement in the OR Integumentary (Hair, Skin) No suspicious lesions. No crepitus or fluctuance. No peri-wound warmth or erythema. No masses.. Wound #1 status is Open. Original cause of wound was Gradually Appeared. The wound is located on the Left,Medial Metatarsal head first. The wound measures 3cm length x 2.5cm width x 0.2cm depth; 5.89cm^2 area and 1.178cm^3 volume. The wound is limited to skin breakdown. There is no tunneling or undermining noted. There is a medium amount of serosanguineous drainage noted. The wound margin is flat and intact. There is no granulation within the wound bed. There is a large (67-100%) amount of necrotic tissue within the wound bed including Eschar and Adherent Slough. The periwound skin  appearance exhibited: Localized Edema, Moist, Erythema. The periwound skin appearance did not exhibit: Callus, Crepitus, Excoriation, Fluctuance, Friable, Induration, Rash, Scarring, Dry/Scaly, Maceration, Atrophie Blanche, Cyanosis, Ecchymosis, Hemosiderin Staining, Mottled, Pallor, Rubor. The surrounding wound skin color is noted with erythema which is circumferential. Periwound temperature was noted as No Abnormality. The periwound has tenderness on palpation. Assessment Active Problems ICD-10 E11.621 - Type 2 diabetes mellitus with foot ulcer I70.245 - Atherosclerosis of native arteries of left leg with ulceration of other part of foot L97.522 - Non-pressure chronic ulcer of other part of left foot with fat layer exposed Jasinski, Emari N. ([id number]) The patient's wound has a significant odor and on deep pressure there is some purulent fluid which extrudes and I have cultured this. There is significant necrotic debris and at this stage I believe he needs operative debridement in the OR under anesthesia with possible washing out of the joint space. He is had full vascular workup and intervention and we have given him aggressive wound wound care and appropriate therapy but he has failed to improve. I will empirically put him on ciprofloxacin and will refer him to Dr. Mayo Ao for surgical debridement of this wound. After debridement of wound VAC may benefit the patient. Depending on the culture and involvement of deep soft tissue he may also benefit from hyperbaric oxygen therapy. This has been discussed with the patient and his wife and they're agreeable about the treatment plan Plan Wound Cleansing: Wound #1 Left,Medial Metatarsal head first: Clean wound with Normal Saline. Cleanse wound with mild soap and water Anesthetic: Wound #1 Left,Medial Metatarsal head first: Topical Lidocaine 4% cream applied to wound bed prior to debridement Primary Wound Dressing: Wound #1  Left,Medial Metatarsal head first: Santyl Ointment Secondary Dressing: Wound #1 Left,Medial Metatarsal head first: Gauze and Kerlix/Conform Dressing Change Frequency: Wound #1 Left,Medial Metatarsal head first: Change dressing every day. Follow-up Appointments: Wound #1 Left,Medial Metatarsal head first: Return Appointment in 1 week. Additional Orders / Instructions: Wound #1 Left,Medial Metatarsal head first: Increase protein intake. Other: - work to keep blood sugars under 180 consistently Laboratory ordered were: Wound culture routine Consults ordered were: [facility], [place] The following medication(s) was prescribed: Cipro oral 500 mg tablet 1 1 tablet oral bid starting 03/21/2016 [name] Small ([id number]) The patient's wound has a significant odor and on deep pressure there is some purulent fluid which extrudes and I have cultured this. There is significant necrotic debris and at this stage I believe he needs operative debridement in the OR under anesthesia with possible  washing out of the joint space. He is had full vascular workup and intervention and we have given him aggressive wound wound care and appropriate therapy but he has failed to improve. I will empirically put him on ciprofloxacin and will refer him to Dr. Mayo Ao for surgical debridement of this wound. After debridement of wound VAC may benefit the patient. Depending on the culture and involvement of deep soft tissue he may also benefit from hyperbaric oxygen therapy. This has been discussed with the patient and his wife and they're agreeable about the treatment plan Electronic Signature(s) Signed: 03/21/2016 2:23:00 PM By: Christin Fudge MD, FACS Entered By: Christin Fudge on 03/21/2016 14:23:00 [name] Small ([id number]) -------------------------------------------------------------------------------- SuperBill Details Patient Name: [name] Small Date of  Service: 03/21/2016 Medical Record Number: [id number] Patient Account Number: [id number] Date of Birth/Sex: [birth year] (82 y.o. Male) Treating RN: Montey Hora Primary Care Physician: [name] Other Clinician: Referring Physician: [name] Treating Physician/Extender: Frann Rider in Treatment: 6 Diagnosis Coding ICD-10 Codes Code Description E11.621 Type 2 diabetes mellitus with foot ulcer I70.245 Atherosclerosis of native arteries of left leg with ulceration of other part of foot L97.522 Non-pressure chronic ulcer of other part of left foot with fat layer exposed Facility Procedures CPT4 Code: [id number] Description: [phone] - WOUND CARE VISIT-LEV 2 EST PT Modifier: Quantity: 1 Physician Procedures CPT4: Description Modifier Quantity Code [id number] - WC PHYS LEVEL 3 - EST PT 1 ICD-10 Description Diagnosis E11.621 Type 2 diabetes mellitus with foot ulcer I70.245 Atherosclerosis of native arteries of left leg with ulceration of other part of foot  L97.522 Non-pressure chronic ulcer of other part of left foot with fat layer exposed Electronic Signature(s) Signed: 03/21/2016 2:23:17 PM By: Christin Fudge MD, FACS Entered By: Christin Fudge on 03/21/2016 14:23:16

## 2016-03-22 ENCOUNTER — Ambulatory Visit (INDEPENDENT_AMBULATORY_CARE_PROVIDER_SITE_OTHER): Payer: Medicare Other | Admitting: Podiatry

## 2016-03-22 ENCOUNTER — Encounter: Payer: Self-pay | Admitting: Podiatry

## 2016-03-22 ENCOUNTER — Telehealth: Payer: Self-pay | Admitting: Sports Medicine

## 2016-03-22 VITALS — BP 136/73 | HR 71 | Temp 98.3°F | Resp 18

## 2016-03-22 DIAGNOSIS — L97929 Non-pressure chronic ulcer of unspecified part of left lower leg with unspecified severity: Secondary | ICD-10-CM | POA: Diagnosis not present

## 2016-03-22 DIAGNOSIS — L97521 Non-pressure chronic ulcer of other part of left foot limited to breakdown of skin: Secondary | ICD-10-CM

## 2016-03-22 DIAGNOSIS — I739 Peripheral vascular disease, unspecified: Secondary | ICD-10-CM | POA: Diagnosis not present

## 2016-03-22 DIAGNOSIS — E119 Type 2 diabetes mellitus without complications: Secondary | ICD-10-CM | POA: Diagnosis not present

## 2016-03-22 MED ORDER — AMOXICILLIN-POT CLAVULANATE 875-125 MG PO TABS: 1.0000 | ORAL_TABLET | Freq: Two times a day (BID) | ORAL | Status: DC

## 2016-03-22 NOTE — Telephone Encounter (Signed)
Sent Augmentin 875mg  to [facility] -Dr. Cannon Kettle

## 2016-03-22 NOTE — Progress Notes (Signed)
Subjective:    Patient ID: [name] Small, male    DOB: 05/24/1928, 82 y.o.   MRN: [id number]  HPI  82 year old male presents the office today for concerns of the wound the side of his left foot which is been ongoing since February 2017 which was previously a callus. Since then the wound has progressed and he is been followed with Dr. Con Memos at the wound care center. He presented to me for surgical consultation for wound debridement. He was recently placed on Cipro as a precaution for infection. He states he has had quite a bit of malodor coming from the wound. He states that his blood sugar this morning was 116 however it was 336 last night. He also underwent angioplasty 2 weeks ago. No other complaints.   Review of Systems  All other systems reviewed and are negative.      Objective:   Physical Exam General: AAO x3, NAD  Dermatological: On the medial aspect of the left foot at the MPJ is an annular ulceration with significant malodor. There is fibrotic tissue overlying the wound. The wound measures 3 x 2.5 cm. There is not appear to be any probing to bone at this time. No tendon undermining. There is curling no drainage but he has reported some serosanguineous drainage. This, no granulation tissue present within the wound. No pus. There is no swelling erythema or a sitting cellulitis. No fluctuance or crepitus. No other open lesions are identified at this time.  Vascular: Dorsalis Pedis artery and Posterior Tibial artery pedal pulses are palpable bilaterally. There is no pain with calf compression, swelling, warmth, erythema.   Neruologic: Grossly intact via light touch bilateral. Vibratory intact via tuning fork bilateral. Protective threshold with Semmes Wienstein monofilament intact to all pedal sites bilateral. Patellar and Achilles deep tendon reflexes 2+ bilateral. No Babinski or clonus noted bilateral.   Musculoskeletal:  No pain, crepitus, or limitation noted with foot and ankle  range of motion bilateral. Muscular strength 5/5 in all groups tested bilateral.  Gait: Unassisted, Nonantalgic.      Assessment & Plan:  82 year old male left medial first MPJ wound with significant malodor. -Treatment options discussed including all alternatives, risks, and complications -Etiology of symptoms were discussed -Previous x-rays are reviewed. -At this point I recommended the patient have surgical debridement of this wound and biopsy. I discussed MRI but I believe that he needs to have operative wound debridement and we can perform a bone biopsy intraoperatively to rule out osteomyelitis. I also discussed with him that given his sugars being elevated this is a sign of infection at the continue to be elevated he needs to the emergency room or if he has any signs or symptoms of worsening infection. -Currently on cipro, will add augmentin.  -Ordered CBC, ESR, CRP, CMP, A1c.  -We will perform surgery as an outpatient on Wednesday for left foot wound debridement, bone biopsy. He understands the risks and complications and wishes to proceed. -The incision placement as well as the postoperative course was discussed with the patient. I discussed risks of the surgery which include, but not limited to, infection, bleeding, pain, swelling, need for further surgery, delayed or nonhealing, painful or ugly scar, numbness or sensation changes, over/under correction, recurrence, transfer lesions, further deformity, hardware failure, DVT/PE, loss of toe/foot. Patient understands these risks and wishes to proceed with surgery. The surgical consent was reviewed with the patient all 3 pages were signed. No promises or guarantees were given to the outcome of  the procedure. All questions were answered to the best of my ability. Before the surgery the patient was encouraged to call the office if there is any further questions. The surgery will be performed at the [facility] on an outpatient  basis.  Celesta Gentile, DPM  *I received a call from Dr. Cannon Kettle, DPM stating that the patient called over the weekend (on Saturday) and he was going to the ER. He did get admitted. I will be more than happy to follow him as an inpatient and do surgery as an inpatient. I would go ahead and get an MRI in the meantime.

## 2016-03-22 NOTE — Progress Notes (Addendum)
[name] Small, [name] Small ([id number]) Visit Report for 03/21/2016 Arrival Information Details Patient Name: [name] Small, [name] Small. Date of Service: 03/21/2016 1:30 PM Medical Record Number: [id number] Patient Account Number: [id number] Date of Birth/Sex: 12/04/1927 (82 y.o. Male) Treating RN: Cornell Barman Primary Care Physician: [name] Other Clinician: Referring Physician: [name] Treating Physician/Extender: Frann Rider in Treatment: 6 Visit Information History Since Last Visit Added or deleted any medications: No Patient Arrived: [name] Small Any new allergies or adverse reactions: No Arrival Time: 13:46 Had a fall or experienced change in No Accompanied By: wife activities of daily living that may affect Transfer Assistance: None risk of falls: Patient Identification Verified: Yes Signs or symptoms of abuse/neglect since last No Secondary Verification Process Completed: Yes visito Patient Requires Transmission-Based No Hospitalized since last visit: No Precautions: Has Dressing in Place as Prescribed: Yes Patient Has Alerts: Yes Pain Present Now: No Patient Alerts: DMII Electronic Signature(s) Signed: 03/21/2016 5:50:20 PM By: Gretta Cool, RN, BSN, Kim RN, BSN Entered By: Gretta Cool, RN, BSN, Kim on 03/21/2016 13:47:59 [name] Small ([id number]) -------------------------------------------------------------------------------- Clinic Level of Care Assessment Details Patient Name: [name] Small Date of Service: 03/21/2016 1:30 PM Medical Record Number: [id number] Patient Account Number: [id number] Date of Birth/Sex: 1928-07-24 (82 y.o. Male) Treating RN: Cornell Barman Primary Care Physician: [name] Other Clinician: Referring Physician: [name] Treating Physician/Extender: Frann Rider in Treatment: 6 Clinic Level of Care Assessment Items TOOL 4 Quantity Score []  - Use when only an EandM is performed on FOLLOW-UP visit 0 ASSESSMENTS - Nursing Assessment /  Reassessment []  - Reassessment of Co-morbidities (includes updates in patient status) 0 []  - Reassessment of Adherence to Treatment Plan 0 ASSESSMENTS - Wound and Skin Assessment / Reassessment X - Simple Wound Assessment / Reassessment - one wound 1 5 []  - Complex Wound Assessment / Reassessment - multiple wounds 0 []  - Dermatologic / Skin Assessment (not related to wound area) 0 ASSESSMENTS - Focused Assessment []  - Circumferential Edema Measurements - multi extremities 0 []  - Nutritional Assessment / Counseling / Intervention 0 []  - Lower Extremity Assessment (monofilament, tuning fork, pulses) 0 []  - Peripheral Arterial Disease Assessment (using hand held doppler) 0 ASSESSMENTS - Ostomy and/or Continence Assessment and Care []  - Incontinence Assessment and Management 0 []  - Ostomy Care Assessment and Management (repouching, etc.) 0 PROCESS - Coordination of Care X - Simple Patient / Family Education for ongoing care 1 15 []  - Complex (extensive) Patient / Family Education for ongoing care 0 X - Staff obtains Programmer, systems, Records, Test Results / Process Orders 1 10 []  - Staff telephones HHA, Nursing Homes / Clarify orders / etc 0 []  - Routine Transfer to another Facility (non-emergent condition) 0 [name] Small, [name] Small ([id number]) []  - Routine Hospital Admission (non-emergent condition) 0 []  - New Admissions / Biomedical engineer / Ordering NPWT, Apligraf, etc. 0 []  - Emergency Hospital Admission (emergent condition) 0 X - Simple Discharge Coordination 1 10 []  - Complex (extensive) Discharge Coordination 0 PROCESS - Special Needs []  - Pediatric / Minor Patient Management 0 []  - Isolation Patient Management 0 []  - Hearing / Language / Visual special needs 0 []  - Assessment of Community assistance (transportation, D/C planning, etc.) 0 []  - Additional assistance / Altered mentation 0 []  - Support Surface(s) Assessment (bed, cushion, seat, etc.) 0 INTERVENTIONS - Wound Cleansing /  Measurement X - Simple Wound Cleansing - one wound 1 5 []  - Complex Wound Cleansing - multiple wounds 0 X - Wound Imaging (photographs - any  number of wounds) 1 5 []  - Wound Tracing (instead of photographs) 0 X - Simple Wound Measurement - one wound 1 5 []  - Complex Wound Measurement - multiple wounds 0 INTERVENTIONS - Wound Dressings X - Small Wound Dressing one or multiple wounds 1 10 []  - Medium Wound Dressing one or multiple wounds 0 []  - Large Wound Dressing one or multiple wounds 0 []  - Application of Medications - topical 0 []  - Application of Medications - injection 0 INTERVENTIONS - Miscellaneous []  - External ear exam 0 [name] Small, [name] Small ([id number]) []  - Specimen Collection (cultures, biopsies, blood, body fluids, etc.) 0 []  - Specimen(s) / Culture(s) sent or taken to Lab for analysis 0 []  - Patient Transfer (multiple staff / Harrel Lemon Lift / Similar devices) 0 []  - Simple Staple / Suture removal (25 or less) 0 []  - Complex Staple / Suture removal (26 or more) 0 []  - Hypo / Hyperglycemic Management (close monitor of Blood Glucose) 0 []  - Ankle / Brachial Index (ABI) - do not check if billed separately 0 X - Vital Signs 1 5 Has the patient been seen at the hospital within the last three years: Yes Total Score: 70 Level Of Care: New/Established - Level 2 Electronic Signature(s) Signed: 03/21/2016 5:50:20 PM By: Gretta Cool, RN, BSN, Kim RN, BSN Entered By: Gretta Cool, RN, BSN, Kim on 03/21/2016 14:06:30 [name] [id number]) -------------------------------------------------------------------------------- Encounter Discharge Information Details Patient Name: [name] Small Date of Service: 03/21/2016 1:30 PM Medical Record Number: [id number] Patient Account Number: [id number] Date of Birth/Sex: 15-Oct-1928 (82 y.o. Male) Treating RN: Cornell Barman Primary Care Physician: [name] Other Clinician: Referring Physician: [name] Treating Physician/Extender: Frann Rider in Treatment: 6 Encounter Discharge Information Items Discharge Pain Level: 0 Discharge Condition: Stable Ambulatory Status: Cane Discharge Destination: Home Transportation: Private Auto Accompanied By: wife Schedule Follow-up Appointment: Yes Medication Reconciliation completed No and provided to Patient/Care [name] Small: Provided on Clinical Summary of Care: 03/21/2016 Form Type Recipient Paper Patient RB Electronic Signature(s) Signed: 03/21/2016 2:17:53 PM By: Ruthine Dose Entered By: Ruthine Dose on 03/21/2016 14:17:52 [name] [id number]) -------------------------------------------------------------------------------- Lower Extremity Assessment Details Patient Name: [name] Small Date of Service: 03/21/2016 1:30 PM Medical Record Number: [id number] Patient Account Number: [id number] Date of Birth/Sex: Mar 27, 1928 (82 y.o. Male) Treating RN: Cornell Barman Primary Care Physician: [name] Other Clinician: Referring Physician: [name] Treating Physician/Extender: Frann Rider in Treatment: 6 Vascular Assessment Pulses: Posterior Tibial Palpable: [Left:Yes] Dorsalis Pedis Palpable: [Left:Yes] Extremity colors, hair growth, and conditions: Extremity Color: [Left:Normal] Hair Growth on Extremity: [Left:No] Temperature of Extremity: [Left:Warm] Capillary Refill: [Left:> 3 seconds] Toe Nail Assessment Left: Right: Thick: Yes Discolored: Yes Deformed: No Improper Length and Hygiene: No Electronic Signature(s) Signed: 03/21/2016 5:50:20 PM By: Gretta Cool, RN, BSN, Kim RN, BSN Entered By: Gretta Cool, RN, BSN, Kim on 03/21/2016 13:52:46 [name] [id number]) -------------------------------------------------------------------------------- Multi Wound Chart Details Patient Name: [name] Small Date of Service: 03/21/2016 1:30 PM Medical Record Number: [id number] Patient Account Number: [id number] Date of Birth/Sex: [birth year] (82  y.o. Male) Treating RN: Cornell Barman Primary Care Physician: [name] Other Clinician: Referring Physician: [name] Treating Physician/Extender: Frann Rider in Treatment: 6 Vital Signs Height(in): 74 Pulse(bpm): 72 Weight(lbs): 217 Blood Pressure 106/55 (mmHg): Body Mass Index(BMI): 28 Temperature(F): 98.2 Respiratory Rate 16 (breaths/min): Photos: [1:No Photos] [N/A:N/A] Wound Location: [1:Left Metatarsal head first - N/A Medial] Wounding Event: [1:Gradually Appeared] [N/A:N/A] Primary Etiology: [1:Diabetic Wound/Ulcer of N/A the Lower Extremity] Comorbid History: [1:Chronic Obstructive  Pulmonary Disease (COPD), Coronary Artery Disease, Hypertension, Peripheral Arterial Disease, Type II Diabetes, Neuropathy] [N/A:N/A] Date Acquired: [1:01/15/2016] [N/A:N/A] Weeks of Treatment: [1:6] [N/A:N/A] Wound Status: [1:Open] [N/A:N/A] Pending Amputation on Yes [N/A:N/A] Presentation: Measurements L x W x D 3x2.5x0.2 [N/A:N/A] (cm) Area (cm) : [1:5.89] [N/A:N/A] Volume (cm) : [1:1.178] [N/A:N/A] % Reduction in Area: [1:-400.00%] [N/A:N/A] % Reduction in Volume: -399.20% [N/A:N/A] Classification: [1:Grade 2] [N/A:N/A] Exudate Amount: [1:Medium] [N/A:N/A] Exudate Type: [1:Serosanguineous] [N/A:N/A] Exudate Color: [1:red, brown] [N/A:N/A] Foul Odor After [1:Yes] [N/A:N/A] Cleansing: [name] Small, [name] Small ([id number]) Odor Anticipated Due to No N/A N/A Product Use: Wound Margin: Flat and Intact N/A N/A Granulation Amount: None Present (0%) N/A N/A Necrotic Amount: Large (67-100%) N/A N/A Necrotic Tissue: Eschar, Adherent Slough N/A N/A Exposed Structures: Fascia: No N/A N/A Fat: No Tendon: No Muscle: No Joint: No Bone: No Limited to Skin Breakdown Epithelialization: None N/A N/A Periwound Skin Texture: Edema: Yes N/A N/A Excoriation: No Induration: No Callus: No Crepitus: No Fluctuance: No Friable: No Rash: No Scarring: No Periwound Skin Moist:  Yes N/A N/A Moisture: Maceration: No Dry/Scaly: No Periwound Skin Color: Erythema: Yes N/A N/A Atrophie Blanche: No Cyanosis: No Ecchymosis: No Hemosiderin Staining: No Mottled: No Pallor: No Rubor: No Erythema Location: Circumferential N/A N/A Temperature: No Abnormality N/A N/A Tenderness on Yes N/A N/A Palpation: Wound Preparation: Ulcer Cleansing: N/A N/A Rinsed/Irrigated with Saline Topical Anesthetic Applied: Other: lidocaine 4% Treatment Notes [name] Small, [name] Small ([id number]) Electronic Signature(s) Signed: 03/21/2016 5:50:20 PM By: Gretta Cool, RN, BSN, Kim RN, BSN Entered By: Gretta Cool, RN, BSN, Kim on 03/21/2016 13:56:21 [name] [id number]) -------------------------------------------------------------------------------- [place] Details Patient Name: [name] Small, [name] Small. Date of Service: 03/21/2016 1:30 PM Medical Record Number: [id number] Patient Account Number: [id number] Date of Birth/Sex: 01/06/1928 (82 y.o. Male) Treating RN: Cornell Barman Primary Care Physician: [name] Other Clinician: Referring Physician: [name] Treating Physician/Extender: Frann Rider in Treatment: 6 Active Inactive Electronic Signature(s) Signed: 04/04/2016 4:56:07 PM By: Gretta Cool RN, BSN, Kim RN, BSN Previous Signature: 03/21/2016 5:50:20 PM Version By: Gretta Cool RN, BSN, Kim RN, BSN Entered By: Gretta Cool, RN, BSN, Kim on 04/04/2016 11:43:05 [name] [id number]) -------------------------------------------------------------------------------- Pain Assessment Details Patient Name: [name] Small Date of Service: 03/21/2016 1:30 PM Medical Record Number: [id number] Patient Account Number: [id number] Date of Birth/Sex: 03/29/1928 (82 y.o. Male) Treating RN: Cornell Barman Primary Care Physician: [name] Other Clinician: Referring Physician: [name] Treating Physician/Extender: Frann Rider in Treatment: 6 Active  Problems Location of Pain Severity and Description of Pain Patient Has Paino No Site Locations Pain Management and Medication Current Pain Management: Electronic Signature(s) Signed: 03/21/2016 5:50:20 PM By: Gretta Cool, RN, BSN, Kim RN, BSN Entered By: Gretta Cool, RN, BSN, Kim on 03/21/2016 13:48:06 [name] [id number]) -------------------------------------------------------------------------------- Patient/Caregiver Education Details Patient Name: [name] Small Date of Service: 03/21/2016 1:30 PM Medical Record Number: [id number] Patient Account Number: [id number] Date of Birth/Gender: [birth year] (82 y.o. Male) Treating RN: Cornell Barman Primary Care Physician: [name] Other Clinician: Referring Physician: [name] Treating Physician/Extender: Frann Rider in Treatment: 6 Education Assessment Education Provided To: Patient Education Topics Provided Wound/Skin Impairment: Handouts: Caring for Your Ulcer Methods: Demonstration Responses: State content correctly Electronic Signature(s) Signed: 03/21/2016 5:50:20 PM By: Gretta Cool, RN, BSN, Kim RN, BSN Entered By: Gretta Cool, RN, BSN, Kim on 03/21/2016 14:13:12 [name] [id number]) -------------------------------------------------------------------------------- Wound Assessment Details Patient Name: [name] Small Date of Service: 03/21/2016 1:30 PM Medical Record Number: [id number] Patient Account Number: [id number] Date of Birth/Sex: 21-Jul-1928 (82  y.o. Male) Treating RN: Cornell Barman Primary Care Physician: [name] Other Clinician: Referring Physician: [name] Treating Physician/Extender: Frann Rider in Treatment: 6 Wound Status Wound Number: 1 Primary Diabetic Wound/Ulcer of the Lower Etiology: Extremity Wound Location: Left Metatarsal head first - Medial Wound Open Status: Wounding Event: Gradually Appeared Comorbid Chronic Obstructive Pulmonary Disease Date Acquired:  01/15/2016 History: (COPD), Coronary Artery Disease, Weeks Of Treatment: 6 Hypertension, Peripheral Arterial Clustered Wound: No Disease, Type II Diabetes, Neuropathy Pending Amputation On Presentation Photos Photo Uploaded By: Gretta Cool, RN, BSN, Kim on 03/21/2016 16:11:13 Wound Measurements Length: (cm) 3 Width: (cm) 2.5 Depth: (cm) 0.2 Area: (cm) 5.89 Volume: (cm) 1.178 % Reduction in Area: -400% % Reduction in Volume: -399.2% Epithelialization: None Tunneling: No Undermining: No Wound Description Classification: Grade 2 Wound Margin: Flat and Intact Exudate Amount: Medium Exudate Type: Serosanguineous Exudate Color: red, brown Foul Odor After Cleansing: Yes Due to Product Use: No Wound Bed Granulation Amount: None Present (0%) Exposed Structure Necrotic Amount: Large (67-100%) Fascia Exposed: No BARRICK, GLEASON ([id number]) Necrotic Quality: Eschar, Adherent Slough Fat Layer Exposed: No Tendon Exposed: No Muscle Exposed: No Joint Exposed: No Bone Exposed: No Limited to Skin Breakdown Periwound Skin Texture Texture Color No Abnormalities Noted: No No Abnormalities Noted: No Callus: No Atrophie Blanche: No Crepitus: No Cyanosis: No Excoriation: No Ecchymosis: No Fluctuance: No Erythema: Yes Friable: No Erythema Location: Circumferential Induration: No Hemosiderin Staining: No Localized Edema: Yes Mottled: No Rash: No Pallor: No Scarring: No Rubor: No Moisture Temperature / Pain No Abnormalities Noted: No Temperature: No Abnormality Dry / Scaly: No Tenderness on Palpation: Yes Maceration: No Moist: Yes Wound Preparation Ulcer Cleansing: Rinsed/Irrigated with Saline Topical Anesthetic Applied: Other: lidocaine 4%, Electronic Signature(s) Signed: 03/21/2016 5:50:20 PM By: Gretta Cool, RN, BSN, Kim RN, BSN Entered By: Gretta Cool, RN, BSN, Kim on 03/21/2016 13:54:38 [name] Small  ([id number]) -------------------------------------------------------------------------------- [place] Details Patient Name: [name] Small Date of Service: 03/21/2016 1:30 PM Medical Record Number: [id number] Patient Account Number: [id number] Date of Birth/Sex: 1928/04/29 (82 y.o. Male) Treating RN: Cornell Barman Primary Care Physician: [name] Other Clinician: Referring Physician: [name] Treating Physician/Extender: Frann Rider in Treatment: 6 Vital Signs Time Taken: 13:48 Temperature (F): 98.2 Height (in): 74 Pulse (bpm): 72 Weight (lbs): 217 Respiratory Rate (breaths/min): 16 Body Mass Index (BMI): 27.9 Blood Pressure (mmHg): 106/55 Reference Range: 80 - 120 mg / dl Electronic Signature(s) Signed: 03/21/2016 5:50:20 PM By: Gretta Cool, RN, BSN, Kim RN, BSN Entered By: Gretta Cool, RN, BSN, Kim on 03/21/2016 13:48:38

## 2016-03-22 NOTE — Patient Instructions (Signed)

## 2016-03-23 ENCOUNTER — Encounter (HOSPITAL_COMMUNITY): Payer: Self-pay | Admitting: Emergency Medicine

## 2016-03-23 ENCOUNTER — Emergency Department (HOSPITAL_COMMUNITY): Payer: Medicare Other

## 2016-03-23 ENCOUNTER — Inpatient Hospital Stay (HOSPITAL_COMMUNITY)
Admission: EM | Admit: 2016-03-23 | Discharge: 2016-03-29 | DRG: 617 | Disposition: A | Discharge status: Skilled Nursing Facility | Payer: Medicare Other | Attending: Family Medicine | Admitting: Family Medicine

## 2016-03-23 DIAGNOSIS — Z899 Acquired absence of limb, unspecified: Secondary | ICD-10-CM

## 2016-03-23 DIAGNOSIS — E11621 Type 2 diabetes mellitus with foot ulcer: Secondary | ICD-10-CM | POA: Diagnosis not present

## 2016-03-23 DIAGNOSIS — Z7951 Long term (current) use of inhaled steroids: Secondary | ICD-10-CM

## 2016-03-23 DIAGNOSIS — M868X7 Other osteomyelitis, ankle and foot: Secondary | ICD-10-CM | POA: Diagnosis present

## 2016-03-23 DIAGNOSIS — M86679 Other chronic osteomyelitis, unspecified ankle and foot: Secondary | ICD-10-CM | POA: Diagnosis not present

## 2016-03-23 DIAGNOSIS — E785 Hyperlipidemia, unspecified: Secondary | ICD-10-CM | POA: Diagnosis present

## 2016-03-23 DIAGNOSIS — E1169 Type 2 diabetes mellitus with other specified complication: Secondary | ICD-10-CM | POA: Diagnosis not present

## 2016-03-23 DIAGNOSIS — E1165 Type 2 diabetes mellitus with hyperglycemia: Secondary | ICD-10-CM | POA: Diagnosis present

## 2016-03-23 DIAGNOSIS — Z7902 Long term (current) use of antithrombotics/antiplatelets: Secondary | ICD-10-CM

## 2016-03-23 DIAGNOSIS — I509 Heart failure, unspecified: Secondary | ICD-10-CM | POA: Diagnosis not present

## 2016-03-23 DIAGNOSIS — N183 Chronic kidney disease, stage 3 (moderate): Secondary | ICD-10-CM | POA: Diagnosis present

## 2016-03-23 DIAGNOSIS — Z7984 Long term (current) use of oral hypoglycemic drugs: Secondary | ICD-10-CM

## 2016-03-23 DIAGNOSIS — I25709 Atherosclerosis of coronary artery bypass graft(s), unspecified, with unspecified angina pectoris: Secondary | ICD-10-CM | POA: Diagnosis not present

## 2016-03-23 DIAGNOSIS — E1151 Type 2 diabetes mellitus with diabetic peripheral angiopathy without gangrene: Secondary | ICD-10-CM | POA: Diagnosis present

## 2016-03-23 DIAGNOSIS — I4891 Unspecified atrial fibrillation: Secondary | ICD-10-CM | POA: Diagnosis not present

## 2016-03-23 DIAGNOSIS — Z886 Allergy status to analgesic agent status: Secondary | ICD-10-CM

## 2016-03-23 DIAGNOSIS — J449 Chronic obstructive pulmonary disease, unspecified: Secondary | ICD-10-CM | POA: Diagnosis not present

## 2016-03-23 DIAGNOSIS — E1122 Type 2 diabetes mellitus with diabetic chronic kidney disease: Secondary | ICD-10-CM | POA: Diagnosis not present

## 2016-03-23 DIAGNOSIS — Z882 Allergy status to sulfonamides status: Secondary | ICD-10-CM | POA: Diagnosis not present

## 2016-03-23 DIAGNOSIS — Z883 Allergy status to other anti-infective agents status: Secondary | ICD-10-CM

## 2016-03-23 DIAGNOSIS — S91302A Unspecified open wound, left foot, initial encounter: Secondary | ICD-10-CM | POA: Diagnosis not present

## 2016-03-23 DIAGNOSIS — E08621 Diabetes mellitus due to underlying condition with foot ulcer: Secondary | ICD-10-CM | POA: Diagnosis not present

## 2016-03-23 DIAGNOSIS — I5032 Chronic diastolic (congestive) heart failure: Secondary | ICD-10-CM | POA: Diagnosis not present

## 2016-03-23 DIAGNOSIS — L97529 Non-pressure chronic ulcer of other part of left foot with unspecified severity: Secondary | ICD-10-CM | POA: Insufficient documentation

## 2016-03-23 DIAGNOSIS — Z794 Long term (current) use of insulin: Secondary | ICD-10-CM | POA: Diagnosis not present

## 2016-03-23 DIAGNOSIS — I10 Essential (primary) hypertension: Secondary | ICD-10-CM | POA: Diagnosis present

## 2016-03-23 DIAGNOSIS — Z9981 Dependence on supplemental oxygen: Secondary | ICD-10-CM | POA: Diagnosis not present

## 2016-03-23 DIAGNOSIS — I130 Hypertensive heart and chronic kidney disease with heart failure and stage 1 through stage 4 chronic kidney disease, or unspecified chronic kidney disease: Secondary | ICD-10-CM | POA: Diagnosis present

## 2016-03-23 DIAGNOSIS — I25810 Atherosclerosis of coronary artery bypass graft(s) without angina pectoris: Secondary | ICD-10-CM | POA: Diagnosis present

## 2016-03-23 DIAGNOSIS — L409 Psoriasis, unspecified: Secondary | ICD-10-CM | POA: Diagnosis present

## 2016-03-23 DIAGNOSIS — M869 Osteomyelitis, unspecified: Secondary | ICD-10-CM

## 2016-03-23 DIAGNOSIS — Z87891 Personal history of nicotine dependence: Secondary | ICD-10-CM | POA: Diagnosis not present

## 2016-03-23 DIAGNOSIS — I2510 Atherosclerotic heart disease of native coronary artery without angina pectoris: Secondary | ICD-10-CM | POA: Diagnosis not present

## 2016-03-23 DIAGNOSIS — Z8551 Personal history of malignant neoplasm of bladder: Secondary | ICD-10-CM

## 2016-03-23 DIAGNOSIS — E1140 Type 2 diabetes mellitus with diabetic neuropathy, unspecified: Secondary | ICD-10-CM | POA: Diagnosis present

## 2016-03-23 HISTORY — DX: Heart failure, unspecified: I50.9

## 2016-03-23 LAB — BASIC METABOLIC PANEL
Anion gap: 11 (ref 5–15)
BUN: 38 mg/dL — ABNORMAL HIGH (ref 6–20)
CO2: 28 mmol/L (ref 22–32)
Calcium: 8.9 mg/dL (ref 8.9–10.3)
Chloride: 97 mmol/L — ABNORMAL LOW (ref 101–111)
Creatinine, Ser: 2.2 mg/dL — ABNORMAL HIGH (ref 0.61–1.24)
GFR calc Af Amer: 30 mL/min — ABNORMAL LOW (ref >60)
GFR calc non Af Amer: 26 mL/min — ABNORMAL LOW (ref >60)
Glucose, Bld: 109 mg/dL — ABNORMAL HIGH (ref 65–99)
Potassium: 4.2 mmol/L (ref 3.5–5.1)
Sodium: 136 mmol/L (ref 135–145)

## 2016-03-23 LAB — CBC WITH DIFFERENTIAL/PLATELET
Basophils Absolute: 0 10*3/uL (ref 0.0–0.1)
Basophils Relative: 0 %
Eosinophils Absolute: 0.3 10*3/uL (ref 0.0–0.7)
Eosinophils Relative: 3 %
HCT: 40.1 % (ref 39.0–52.0)
Hemoglobin: 12.9 g/dL — ABNORMAL LOW (ref 13.0–17.0)
Lymphocytes Relative: 12 %
Lymphs Abs: 1.2 10*3/uL (ref 0.7–4.0)
MCH: 31.5 pg (ref 26.0–34.0)
MCHC: 32.2 g/dL (ref 30.0–36.0)
MCV: 98 fL (ref 78.0–100.0)
Monocytes Absolute: 1.4 10*3/uL — ABNORMAL HIGH (ref 0.1–1.0)
Monocytes Relative: 14 %
Neutro Abs: 6.9 10*3/uL (ref 1.7–7.7)
Neutrophils Relative %: 71 %
Platelets: 251 10*3/uL (ref 150–400)
RBC: 4.09 MIL/uL — ABNORMAL LOW (ref 4.22–5.81)
RDW: 12 % (ref 11.5–15.5)
WBC: 9.8 10*3/uL (ref 4.0–10.5)

## 2016-03-23 LAB — I-STAT CG4 LACTIC ACID, ED: Lactic Acid, Venous: 0.79 mmol/L (ref 0.5–2.0)

## 2016-03-23 MED ORDER — VANCOMYCIN HCL 10 G IV SOLR
1250.0000 mg | Freq: Once | INTRAVENOUS | Status: DC
  Filled 2016-03-23: qty 1250

## 2016-03-23 MED ORDER — PIPERACILLIN-TAZOBACTAM 3.375 G IVPB 30 MIN
3.3750 g | Freq: Once | INTRAVENOUS | Status: AC
  Administered 2016-03-23: 3.375 g via INTRAVENOUS
  Filled 2016-03-23: qty 50

## 2016-03-23 NOTE — ED Notes (Addendum)
Per pt and family, pt from home with a wound to the left foot. Per family, wound has begun to drain and developed an odor. Wound has been present since February 2017, pt has been taking antibiotics, recently blood sugars have been uncontrolled. Family advised to seek care at the ED.

## 2016-03-23 NOTE — ED Notes (Signed)
Phlebotomy at bedside.

## 2016-03-23 NOTE — ED Provider Notes (Signed)
CSN: [id number]     Arrival date & time 03/23/16  2102 History   First MD Initiated Contact with Patient 03/23/16 2121     Chief Complaint  Patient presents with  . Wound Infection     (Consider location/radiation/quality/duration/timing/severity/associated sxs/prior Treatment) HPI Comments: 81 year old male with uncontrolled diabetes, chronic kidney disease, CAD, vascular disease, atrial fibrillation, heart failure presents with family for worsening wound on his left foot. Patient has been on oral antibiotics Cipro and Augmentin yet wound continues to worsen with strong odor. Patient has been weaker than normal as well. No documented fevers at home. Patient had angioplasty of that leg 2 weeks prior. Patient was told to return for worsening signs and symptoms, vascular surgery is aware. Patient is scheduled then had an MRI in the future.  The history is provided by the patient and a relative.    Past Medical History  Diagnosis Date  . BENIGN PROSTATIC HYPERTROPHY 05/21/2007  . DIVERTICULOSIS, COLON 05/21/2007    pt denies  . HYPERTENSION 05/21/2007  . PSORIASIS, SCALP 10/25/2008  . Cellulitis of left foot     Hospitalized in 2006  . Gross hematuria     pt denies  . Psoriasis ELBOWS  . Fatigue AGE-RELATED  . CAD (coronary artery disease) CARDIOLOGIST- DR WALL - VISIT IN JUNE 2012 FOR CATH    nonobstructive by cath 6/12:  mid LAD 30%, proximal obtuse marginal-2 30%, proximal RCA 20%, mid RCA 30-40%.  He had normal cardiac output and mildly elevated filling pressures but no significant pulmonary hypertension;   Echocardiogram in May 2012 demonstrated EF 50-55% and left atrial enlargement   . Impaired hearing BILATERAL HEARING AIDS  . Back pain     s/p lumbar injection 2014  . Chronic kidney disease (CKD), stage III (moderate) 12/04/2007    FOLLOWED BY DR PATEL  . PAD (peripheral artery disease) ([place])   . On home oxygen therapy     "2L at nighttime" (02/28/2016)  . DIABETES MELLITUS,  TYPE II 05/21/2007  . Arthritis   . Chronic pain of lower extremity   . Bladder cancer ([place]) 10/2011  . CHF (congestive heart failure) ([place])     per family, issues in 2012   Past Surgical History  Procedure Laterality Date  . Vasectomy  1990s  . Penile prosthesis implant  1990s  . Cardiovascular stress test  2007  . Cardiac catheterization  04-24-11/  DR MUHAMMAD ARIDA    MILD NONOBSTRUCTIVE CAD, NORMA CARDIAC OUTPUT  . Appendectomy  1941  . Tonsillectomy and adenoidectomy  1962  . Cataract extraction w/ intraocular lens  implant, bilateral Bilateral ~ 2010  . Incision and drainage foot Left ~ 2005    LEFT FOOT DUE TO INFECTION FROM  NAIL PUNCTURE INJURY  . Cystoscopy w/ retrogrades  10/30/2011    Procedure: CYSTOSCOPY WITH RETROGRADE PYELOGRAM;  Surgeon: Molli Hazard, MD;  [facility];  Service: Urology;  Laterality: Bilateral;  CYSTOSCOPY POSS TURBT BILATERAL RETROGRADE PYLEOGRAM   . Transurethral resection of bladder tumor  10/30/2011    Procedure: TRANSURETHRAL RESECTION OF BLADDER TUMOR (TURBT);  Surgeon: Molli Hazard, MD;  Location: [facility];  Service: Urology;  Laterality: N/A;  . Cystoscopy  12/25/2011    Procedure: CYSTOSCOPY;  Surgeon: Molli Hazard, MD;  Location: [facility];  Service: Urology;  Laterality: N/A;  needs intubation and to be paralyzed   . Transurethral resection of bladder tumor  12/25/2011    Procedure:  TRANSURETHRAL RESECTION OF BLADDER TUMOR (TURBT);  Surgeon: Molli Hazard, MD;  Location: [facility];  Service: Urology;  Laterality: N/A;  need long gyrus instruments   . Cystoscopy w/ retrogrades  11/30/2012    Procedure: CYSTOSCOPY WITH RETROGRADE PYELOGRAM;  Surgeon: Molli Hazard, MD;  Location: [facility];  Service: Urology;  Laterality: Bilateral;  Flexible cystoscopy.  . Cystoscopy with biopsy  11/30/2012    Procedure:  CYSTOSCOPY WITH BIOPSY;  Surgeon: Molli Hazard, MD;  Location: [facility];  Service: Urology;  Laterality: N/A;  . Peripheral vascular catheterization N/A 02/14/2016    Procedure: Abdominal Aortogram w/Lower Extremity;  Surgeon: Wellington Hampshire, MD;  Location: [place] [facility];  Service: Cardiovascular;  Laterality: N/A;  . Peripheral vascular catheterization Left 02/28/2016    Procedure: Peripheral Vascular Balloon Angioplasty;  Surgeon: Wellington Hampshire, MD;  Location: [place] [facility];  Service: Cardiovascular;  Laterality: Left;  left peroneal and popliteal artery   Family History  Problem Relation Age of Onset  . Diabetes Mother   . Heart disease Brother   . Heart disease Brother   . Heart disease Brother   . Heart disease Brother   . Heart disease Brother   . Heart disease Brother   . Lung cancer Brother    Social History  Substance Use Topics  . Smoking status: Former Smoker -- 2.00 packs/day for 22 years    Types: Cigarettes    Quit date: 11/11/1968  . Smokeless tobacco: Never Used  . Alcohol Use: 1.2 oz/week    2 Glasses of wine per week     Comment: occassional    Review of Systems  Constitutional: Negative for fever and chills.  HENT: Negative for congestion.   Eyes: Negative for visual disturbance.  Respiratory: Negative for shortness of breath.   Cardiovascular: Negative for chest pain.  Gastrointestinal: Negative for vomiting and abdominal pain.  Genitourinary: Negative for dysuria and flank pain.  Musculoskeletal: Negative for back pain, neck pain and neck stiffness.  Skin: Positive for rash and wound.  Neurological: Positive for dizziness. Negative for light-headedness and headaches.      Allergies  Actos; Aspirin; Ace inhibitors; Gabapentin; Lyrica; Pravastatin; Bactrim; and Sulfa drugs cross reactors  Home Medications   Prior to Admission medications   Medication Sig Start Date End Date Taking? Authorizing Provider   acetaminophen (TYLENOL) 500 MG tablet Take 1,000 mg by mouth every 8 (eight) hours as needed for mild pain, moderate pain or headache.   Yes Historical Provider, MD  amLODipine (NORVASC) 5 MG tablet Take 1 tablet (5 mg total) by mouth daily. 11/10/15  Yes Tonia Ghent, MD  amoxicillin-clavulanate (AUGMENTIN) 875-125 MG tablet Take 1 tablet by mouth 2 (two) times daily. 03/22/16  Yes Trula [place], DPM  atorvastatin (LIPITOR) 10 MG tablet Take 1 tablet (10 mg total) by mouth daily. 02/23/16  Yes Wellington Hampshire, MD  budesonide-formoterol (SYMBICORT) 160-4.5 MCG/ACT inhaler Inhale 2 puffs into the lungs 2 (two) times daily as needed (shortness of breath).  01/21/14  Yes Tonia Ghent, MD  ciprofloxacin (CIPRO) 500 MG tablet Take 500 mg by mouth 2 (two) times daily.   Yes Historical Provider, MD  clobetasol (TEMOVATE) 0.05 % external solution Apply 1 application topically daily.   Yes Historical Provider, MD  clopidogrel (PLAVIX) 75 MG tablet Take 1 tablet by mouth  daily 02/08/16  Yes Tonia Ghent, MD  collagenase (SANTYL) ointment Apply 1  application topically daily.   Yes Historical Provider, MD  furosemide (LASIX) 40 MG tablet TAKE 1 TABLET BY MOUTH  EVERY MONDAY, WEDNESDAY,  AND FRIDAY 12/01/15  Yes Tonia Ghent, MD  gabapentin (NEURONTIN) 100 MG capsule Take 100 mg by mouth 3 (three) times daily.   Yes Historical Provider, MD  glipiZIDE (GLUCOTROL XL) 5 MG 24 hr tablet Take 1 tablet by mouth  daily with breakfast 12/01/15  Yes Tonia Ghent, MD  insulin glargine (LANTUS) 100 UNIT/ML injection Inject 25 Units into the skin 3 (three) times daily.  01/21/14  Yes Saima Rizwan, MD   BP 115/60 mmHg  Pulse 74  Temp(Src) 98 F (36.7 C) (Oral)  Resp 26  Ht 6\' 2"  (1.88 m)  Wt 194 lb (87.998 kg)  BMI 24.90 kg/m2  SpO2 97% Physical Exam  Constitutional: He is oriented to person, place, and time. He appears well-developed.  HENT:  Head: Normocephalic and atraumatic.  Mild dry mucous  membranes  Eyes: Right eye exhibits no discharge. Left eye exhibits no discharge.  Neck: Normal range of motion. Neck supple. No tracheal deviation present.  Cardiovascular: Normal rate.   Pulmonary/Chest: Effort normal.  Abdominal: Soft. He exhibits no distension. There is no tenderness. There is no guarding.  Musculoskeletal: He exhibits no edema.  Neurological: He is alert and oriented to person, place, and time.  Patient alert, difficulty hearing  Skin: Skin is warm. Rash noted. There is erythema.  Patient has 2.5 cm ulcer medial aspect of distal left foot. Mild surrounding dried drainage, nontender to palpation, bone palpated. No streaking erythema or significant warmth and remainder of the foot. 2+ pulses posterior tibial and dorsalis pedis in the left foot. Patient has range of motion. Strong odor to the wound  Nursing note and vitals reviewed.   ED Course  Procedures (including critical care time) Labs Review Labs Reviewed  CBC WITH DIFFERENTIAL/PLATELET - Abnormal; Notable for the following:    RBC 4.09 (*)    Hemoglobin 12.9 (*)    Monocytes Absolute 1.4 (*)    All other components within normal limits  BASIC METABOLIC PANEL - Abnormal; Notable for the following:    Chloride 97 (*)    Glucose, Bld 109 (*)    BUN 38 (*)    Creatinine, Ser 2.20 (*)    GFR calc non Af Amer 26 (*)    GFR calc Af Amer 30 (*)    All other components within normal limits  CULTURE, BLOOD (ROUTINE X 2)  CULTURE, BLOOD (ROUTINE X 2)  SEDIMENTATION RATE  I-STAT CG4 LACTIC ACID, ED    Imaging Review Dg Foot Complete Left  03/23/2016  CLINICAL DATA:  82 year old male with infected wound in the medial aspect of the left foot adjacent to the MTP joint. EXAM: LEFT FOOT - COMPLETE 3+ VIEW COMPARISON:  Radiographs dated 03/14/2016 FINDINGS: There is no acute fracture or dislocation. The bones are osteopenic. There is a focal area of decreased mineralization of the distal aspect of the first metatarsal  which is new from prior study. Osteomyelitis is not excluded. Correlation with clinical exam recommended. MRI or white blood cell nuclear scan may provide better evaluation. There is no periosteal elevation or bone erosion. There is skin irregularity medial to the first metatarsophalangeal joint compatible with known skin wound. No radiopaque foreign object identified. No soft tissue gas. IMPRESSION: No acute fracture or dislocation. Skin wound along the medial aspect of the first MTP. There is focal area of decreased  mineralization involving the head of the first metatarsal bone adjacent to the skin wound. Osteomyelitis is not excluded. MRI may provide better evaluation. Electronically Signed   By: Anner Crete M.D.   On: 03/23/2016 22:53   I have personally reviewed and evaluated these images and lab results as part of my medical decision-making.   EKG Interpretation None      MDM   Final diagnoses:  Diabetic ulcer of left foot associated with diabetes mellitus due to underlying condition ([place])   Uncontrolled diabetic presents with worsening left foot ulcer. Concern for worsening infection or possibly osteomyelitis. Plan for blood work, blood cultures, IV antibiotics and admission for further evaluation and treatment.  The patients results and plan were reviewed and discussed.   Any x-rays performed were independently reviewed by myself.   Differential diagnosis were considered with the presenting HPI.  Medications  vancomycin (VANCOCIN) 1,250 mg in sodium chloride 0.9 % 250 mL IVPB (not administered)  piperacillin-tazobactam (ZOSYN) IVPB 3.375 g (3.375 g Intravenous New Bag/Given 03/23/16 2327)    Filed Vitals:   03/23/16 2116 03/23/16 2145 03/23/16 2200  BP: 138/66 116/65 115/60  Pulse: 84 80 74  Temp: 98 F (36.7 C)    TempSrc: Oral    Resp: 28 18 26   Height: 6\' 2"  (1.88 m)    Weight: 194 lb (87.998 kg)    SpO2: 92% 97% 97%    Final diagnoses:  Diabetic ulcer of left  foot associated with diabetes mellitus due to underlying condition [facility])    Admission/ observation were discussed with the admitting physician, patient and/or family and they are comfortable with the plan.      Elnora Morrison, MD 03/24/16 [phone]

## 2016-03-24 ENCOUNTER — Inpatient Hospital Stay (HOSPITAL_COMMUNITY): Payer: Medicare Other

## 2016-03-24 DIAGNOSIS — R2681 Unsteadiness on feet: Secondary | ICD-10-CM | POA: Diagnosis not present

## 2016-03-24 DIAGNOSIS — I25709 Atherosclerosis of coronary artery bypass graft(s), unspecified, with unspecified angina pectoris: Secondary | ICD-10-CM | POA: Diagnosis not present

## 2016-03-24 DIAGNOSIS — M86679 Other chronic osteomyelitis, unspecified ankle and foot: Secondary | ICD-10-CM | POA: Diagnosis not present

## 2016-03-24 DIAGNOSIS — E1165 Type 2 diabetes mellitus with hyperglycemia: Secondary | ICD-10-CM | POA: Diagnosis present

## 2016-03-24 DIAGNOSIS — Z7984 Long term (current) use of oral hypoglycemic drugs: Secondary | ICD-10-CM | POA: Diagnosis not present

## 2016-03-24 DIAGNOSIS — E08621 Diabetes mellitus due to underlying condition with foot ulcer: Secondary | ICD-10-CM | POA: Insufficient documentation

## 2016-03-24 DIAGNOSIS — I130 Hypertensive heart and chronic kidney disease with heart failure and stage 1 through stage 4 chronic kidney disease, or unspecified chronic kidney disease: Secondary | ICD-10-CM | POA: Diagnosis not present

## 2016-03-24 DIAGNOSIS — M869 Osteomyelitis, unspecified: Secondary | ICD-10-CM | POA: Diagnosis not present

## 2016-03-24 DIAGNOSIS — R29898 Other symptoms and signs involving the musculoskeletal system: Secondary | ICD-10-CM | POA: Diagnosis not present

## 2016-03-24 DIAGNOSIS — J449 Chronic obstructive pulmonary disease, unspecified: Secondary | ICD-10-CM | POA: Diagnosis not present

## 2016-03-24 DIAGNOSIS — M6281 Muscle weakness (generalized): Secondary | ICD-10-CM | POA: Diagnosis not present

## 2016-03-24 DIAGNOSIS — L409 Psoriasis, unspecified: Secondary | ICD-10-CM | POA: Diagnosis present

## 2016-03-24 DIAGNOSIS — E1151 Type 2 diabetes mellitus with diabetic peripheral angiopathy without gangrene: Secondary | ICD-10-CM | POA: Diagnosis present

## 2016-03-24 DIAGNOSIS — Z886 Allergy status to analgesic agent status: Secondary | ICD-10-CM | POA: Diagnosis not present

## 2016-03-24 DIAGNOSIS — Z883 Allergy status to other anti-infective agents status: Secondary | ICD-10-CM | POA: Diagnosis not present

## 2016-03-24 DIAGNOSIS — E1169 Type 2 diabetes mellitus with other specified complication: Secondary | ICD-10-CM | POA: Diagnosis not present

## 2016-03-24 DIAGNOSIS — Z882 Allergy status to sulfonamides status: Secondary | ICD-10-CM | POA: Diagnosis not present

## 2016-03-24 DIAGNOSIS — L97529 Non-pressure chronic ulcer of other part of left foot with unspecified severity: Secondary | ICD-10-CM | POA: Insufficient documentation

## 2016-03-24 DIAGNOSIS — I4891 Unspecified atrial fibrillation: Secondary | ICD-10-CM | POA: Diagnosis not present

## 2016-03-24 DIAGNOSIS — N183 Chronic kidney disease, stage 3 (moderate): Secondary | ICD-10-CM | POA: Diagnosis present

## 2016-03-24 DIAGNOSIS — E11621 Type 2 diabetes mellitus with foot ulcer: Secondary | ICD-10-CM | POA: Diagnosis not present

## 2016-03-24 DIAGNOSIS — I5032 Chronic diastolic (congestive) heart failure: Secondary | ICD-10-CM | POA: Diagnosis present

## 2016-03-24 DIAGNOSIS — I739 Peripheral vascular disease, unspecified: Secondary | ICD-10-CM | POA: Diagnosis not present

## 2016-03-24 DIAGNOSIS — Z89412 Acquired absence of left great toe: Secondary | ICD-10-CM | POA: Diagnosis not present

## 2016-03-24 DIAGNOSIS — Z7902 Long term (current) use of antithrombotics/antiplatelets: Secondary | ICD-10-CM | POA: Diagnosis not present

## 2016-03-24 DIAGNOSIS — Z87891 Personal history of nicotine dependence: Secondary | ICD-10-CM | POA: Diagnosis not present

## 2016-03-24 DIAGNOSIS — R269 Unspecified abnormalities of gait and mobility: Secondary | ICD-10-CM | POA: Diagnosis not present

## 2016-03-24 DIAGNOSIS — S98912A Complete traumatic amputation of left foot, level unspecified, initial encounter: Secondary | ICD-10-CM | POA: Diagnosis not present

## 2016-03-24 DIAGNOSIS — Z794 Long term (current) use of insulin: Secondary | ICD-10-CM | POA: Diagnosis not present

## 2016-03-24 DIAGNOSIS — E785 Hyperlipidemia, unspecified: Secondary | ICD-10-CM | POA: Diagnosis present

## 2016-03-24 DIAGNOSIS — N189 Chronic kidney disease, unspecified: Secondary | ICD-10-CM | POA: Diagnosis not present

## 2016-03-24 DIAGNOSIS — I2510 Atherosclerotic heart disease of native coronary artery without angina pectoris: Secondary | ICD-10-CM | POA: Diagnosis present

## 2016-03-24 DIAGNOSIS — R531 Weakness: Secondary | ICD-10-CM | POA: Diagnosis not present

## 2016-03-24 DIAGNOSIS — Z7951 Long term (current) use of inhaled steroids: Secondary | ICD-10-CM | POA: Diagnosis not present

## 2016-03-24 DIAGNOSIS — R296 Repeated falls: Secondary | ICD-10-CM | POA: Diagnosis not present

## 2016-03-24 DIAGNOSIS — I5030 Unspecified diastolic (congestive) heart failure: Secondary | ICD-10-CM | POA: Diagnosis not present

## 2016-03-24 DIAGNOSIS — M868X7 Other osteomyelitis, ankle and foot: Secondary | ICD-10-CM | POA: Diagnosis not present

## 2016-03-24 DIAGNOSIS — M549 Dorsalgia, unspecified: Secondary | ICD-10-CM | POA: Diagnosis not present

## 2016-03-24 DIAGNOSIS — E1140 Type 2 diabetes mellitus with diabetic neuropathy, unspecified: Secondary | ICD-10-CM | POA: Diagnosis present

## 2016-03-24 DIAGNOSIS — S98112D Complete traumatic amputation of left great toe, subsequent encounter: Secondary | ICD-10-CM | POA: Diagnosis not present

## 2016-03-24 DIAGNOSIS — E1122 Type 2 diabetes mellitus with diabetic chronic kidney disease: Secondary | ICD-10-CM | POA: Diagnosis not present

## 2016-03-24 DIAGNOSIS — I10 Essential (primary) hypertension: Secondary | ICD-10-CM | POA: Diagnosis not present

## 2016-03-24 DIAGNOSIS — Z9981 Dependence on supplemental oxygen: Secondary | ICD-10-CM | POA: Diagnosis not present

## 2016-03-24 DIAGNOSIS — Z8551 Personal history of malignant neoplasm of bladder: Secondary | ICD-10-CM | POA: Diagnosis not present

## 2016-03-24 DIAGNOSIS — Z4781 Encounter for orthopedic aftercare following surgical amputation: Secondary | ICD-10-CM | POA: Diagnosis not present

## 2016-03-24 DIAGNOSIS — K269 Duodenal ulcer, unspecified as acute or chronic, without hemorrhage or perforation: Secondary | ICD-10-CM | POA: Diagnosis not present

## 2016-03-24 LAB — SEDIMENTATION RATE: Sed Rate: 106 mm/hr — ABNORMAL HIGH (ref 0–16)

## 2016-03-24 LAB — WOUND CULTURE

## 2016-03-24 LAB — GLUCOSE, CAPILLARY
Glucose-Capillary: 61 mg/dL — ABNORMAL LOW (ref 65–99)
Glucose-Capillary: 109 mg/dL — ABNORMAL HIGH (ref 65–99)
Glucose-Capillary: 131 mg/dL — ABNORMAL HIGH (ref 65–99)
Glucose-Capillary: 242 mg/dL — ABNORMAL HIGH (ref 65–99)
Glucose-Capillary: 219 mg/dL — ABNORMAL HIGH (ref 65–99)
Glucose-Capillary: 202 mg/dL — ABNORMAL HIGH (ref 65–99)

## 2016-03-24 MED ORDER — FUROSEMIDE 40 MG PO TABS
40.0000 mg | ORAL_TABLET | ORAL | Status: DC
  Administered 2016-03-24: 40 mg via ORAL
  Filled 2016-03-24 (×2): qty 1

## 2016-03-24 MED ORDER — ENOXAPARIN SODIUM 30 MG/0.3ML ~~LOC~~ SOLN
30.0000 mg | SUBCUTANEOUS | Status: DC
  Administered 2016-03-24 – 2016-03-27 (×4): 30 mg via SUBCUTANEOUS
  Filled 2016-03-24 (×4): qty 0.3

## 2016-03-24 MED ORDER — ATORVASTATIN CALCIUM 10 MG PO TABS
10.0000 mg | ORAL_TABLET | Freq: Every day | ORAL | Status: DC
  Administered 2016-03-24 – 2016-03-29 (×6): 10 mg via ORAL
  Filled 2016-03-24 (×6): qty 1

## 2016-03-24 MED ORDER — ACETAMINOPHEN 500 MG PO TABS
1000.0000 mg | ORAL_TABLET | Freq: Three times a day (TID) | ORAL | Status: DC | PRN
  Administered 2016-03-26: 1000 mg via ORAL
  Filled 2016-03-24: qty 2

## 2016-03-24 MED ORDER — INSULIN GLARGINE 100 UNIT/ML ~~LOC~~ SOLN
25.0000 [IU] | SUBCUTANEOUS | Status: DC
  Administered 2016-03-24 – 2016-03-28 (×5): 25 [IU] via SUBCUTANEOUS
  Filled 2016-03-24 (×9): qty 0.25

## 2016-03-24 MED ORDER — AMLODIPINE BESYLATE 5 MG PO TABS
5.0000 mg | ORAL_TABLET | Freq: Every day | ORAL | Status: DC
  Administered 2016-03-24 – 2016-03-26 (×3): 5 mg via ORAL
  Filled 2016-03-24 (×3): qty 1

## 2016-03-24 MED ORDER — INSULIN GLARGINE 100 UNIT/ML ~~LOC~~ SOLN: 25.0000 [IU] | Freq: Three times a day (TID) | SUBCUTANEOUS | Status: DC

## 2016-03-24 MED ORDER — VANCOMYCIN HCL IN DEXTROSE 1-5 GM/200ML-% IV SOLN: 1000.0000 mg | INTRAVENOUS | Status: DC

## 2016-03-24 MED ORDER — PIPERACILLIN-TAZOBACTAM 3.375 G IVPB
3.3750 g | Freq: Three times a day (TID) | INTRAVENOUS | Status: DC
  Administered 2016-03-24 – 2016-03-26 (×7): 3.375 g via INTRAVENOUS
  Filled 2016-03-24 (×9): qty 50

## 2016-03-24 MED ORDER — MOMETASONE FURO-FORMOTEROL FUM 200-5 MCG/ACT IN AERO
2.0000 | INHALATION_SPRAY | Freq: Two times a day (BID) | RESPIRATORY_TRACT | Status: DC
  Administered 2016-03-24 – 2016-03-29 (×10): 2 via RESPIRATORY_TRACT
  Filled 2016-03-24 (×2): qty 8.8

## 2016-03-24 MED ORDER — INSULIN ASPART 100 UNIT/ML ~~LOC~~ SOLN
0.0000 [IU] | Freq: Three times a day (TID) | SUBCUTANEOUS | Status: DC
  Administered 2016-03-24: 3 [IU] via SUBCUTANEOUS
  Administered 2016-03-24: 1 [IU] via SUBCUTANEOUS
  Administered 2016-03-25 (×2): 3 [IU] via SUBCUTANEOUS
  Administered 2016-03-26: 5 [IU] via SUBCUTANEOUS
  Administered 2016-03-26: 7 [IU] via SUBCUTANEOUS
  Administered 2016-03-26: 2 [IU] via SUBCUTANEOUS
  Administered 2016-03-27: 3 [IU] via SUBCUTANEOUS
  Administered 2016-03-27: 9 [IU] via SUBCUTANEOUS
  Administered 2016-03-28: 2 [IU] via SUBCUTANEOUS
  Administered 2016-03-28: 5 [IU] via SUBCUTANEOUS
  Administered 2016-03-29: 2 [IU] via SUBCUTANEOUS
  Administered 2016-03-29: 3 [IU] via SUBCUTANEOUS

## 2016-03-24 MED ORDER — CLOBETASOL PROPIONATE 0.05 % EX OINT
1.0000 "application " | TOPICAL_OINTMENT | Freq: Every day | CUTANEOUS | Status: DC
  Administered 2016-03-24 – 2016-03-27 (×4): 1 via TOPICAL
  Filled 2016-03-24: qty 15

## 2016-03-24 MED ORDER — CLOPIDOGREL BISULFATE 75 MG PO TABS
75.0000 mg | ORAL_TABLET | Freq: Every day | ORAL | Status: DC
  Administered 2016-03-24 – 2016-03-29 (×6): 75 mg via ORAL
  Filled 2016-03-24 (×6): qty 1

## 2016-03-24 MED ORDER — GABAPENTIN 100 MG PO CAPS
100.0000 mg | ORAL_CAPSULE | Freq: Three times a day (TID) | ORAL | Status: DC
  Administered 2016-03-24 – 2016-03-29 (×16): 100 mg via ORAL
  Filled 2016-03-24 (×16): qty 1

## 2016-03-24 NOTE — Progress Notes (Signed)
Pharmacy Antibiotic Note  COLYN ANGRISANI is a 82 y.o. male admitted on 03/23/2016 with worsening left foot wound despite treatment with Cipro and Augmentin.  Pharmacy has been consulted for Vancomycin and Zosyn dosing.  Vancomycin 1250 mg IV given in ED at [place]: Vancomycin 1 g IV q48h Zosyn 3.375 g IV q8h   Height: 6\' 2"  (188 cm) Weight: 194 lb (87.998 kg) IBW/kg (Calculated) : 82.2  Temp (24hrs), Avg:98 F (36.7 C), Min:98 F (36.7 C), Max:98 F (36.7 C)   Recent Labs Lab 03/23/16 2304 03/23/16 2311  WBC 9.8  --   CREATININE 2.20*  --   LATICACIDVEN  --  0.79    Estimated Creatinine Clearance: 28.5 mL/min (by C-G formula based on Cr of 2.2).    Allergies  Allergen Reactions  . Actos [Pioglitazone Hydrochloride] Other (See Comments)    Held 2012 bladder cancer  . Aspirin Other (See Comments)    Held 2012 due to hematuria and bruising.   . Ace Inhibitors Cough  . Gabapentin     Upset stomach and diarrhea - tolerates low doses  . Lyrica [Pregabalin] Other (See Comments)    swelling  . Pravastatin Other (See Comments)    Swelling of feet  . Bactrim Rash and Other (See Comments)    Rash, presumed allergy  . Sulfa Drugs Cross Reactors Rash    Antimicrobials this admission: Vancomycin 5/14 >> Zosyn 5/13 >>  Caryl Pina 03/24/2016 12:53 AM

## 2016-03-24 NOTE — ED Notes (Signed)
Admitting at bedside 

## 2016-03-24 NOTE — H&P (Signed)
History and Physical  [name] Small [name] DOB: December 31, 1927 DOA: 03/23/2016  PCP:  [name], MD   Chief Complaint:  Diabetic foot ulcer   History of Present Illness:  Patient is a 82 yo male with history of DMII, CKD, CAD, PVD, Afib, CHF who came with cc of worsening left foot wound. He has had a wound for a while but the wound became infected last week. It started oozing and became smelly. He was started initially on Cipro then on Augmentin .   Review of Systems:  CONSTITUTIONAL:     No night sweats.  No fatigue.  No fever. No chills. Eyes:                            No visual changes.  No eye pain.  No eye discharge.   ENT:                              No epistaxis.  No sinus pain.  No sore throat.   No congestion. RESPIRATORY:           No cough.  No wheeze.  No hemoptysis.  No dyspnea CARDIOVASCULAR   :  No chest pains.  No palpitations. GASTROINTESTINAL:  No abdominal pain.  No nausea. No vomiting.  No diarrhea. No constipation.  No hematemesis.  No hematochezia.  No melena. GENITOURINARY:      No urgency.  No frequency.  No dysuria.  No hematuria.  No obstructive symptoms.  No discharge.  No pain.   MUSCULOSKELETAL:  No musculoskeletal pain.  No joint swelling.  No arthritis. NEUROLOGICAL:        No confusion.  No weakness. No headache. No seizure. PSYCHIATRIC:             No depression. No anxiety. No suicidal ideation. SKIN:                             No rashes.  +lesions.  +wounds. ENDOCRINE:                No weight loss.  No polydipsia.  No polyuria.  No polyphagia. HEMATOLOGIC:           No purpura.  No petechiae.  No bleeding.  ALLERGIC                 : No pruritus.  No angioedema Other:  Past Medical and Surgical History:   Past Medical History  Diagnosis Date  . BENIGN PROSTATIC HYPERTROPHY 05/21/2007  . DIVERTICULOSIS, COLON 05/21/2007    pt denies  . HYPERTENSION 05/21/2007  . PSORIASIS, SCALP 10/25/2008  . Cellulitis of left foot    Hospitalized in 2006  . Gross hematuria     pt denies  . Psoriasis ELBOWS  . Fatigue AGE-RELATED  . CAD (coronary artery disease) CARDIOLOGIST- DR WALL - VISIT IN JUNE 2012 FOR CATH    nonobstructive by cath 6/12:  mid LAD 30%, proximal obtuse marginal-2 30%, proximal RCA 20%, mid RCA 30-40%.  He had normal cardiac output and mildly elevated filling pressures but no significant pulmonary hypertension;   Echocardiogram in May 2012 demonstrated EF 50-55% and left atrial enlargement   . Impaired hearing BILATERAL HEARING AIDS  . Back pain     s/p lumbar injection 2014  . Chronic kidney disease (CKD), stage III (  moderate) 12/04/2007    FOLLOWED BY DR PATEL  . PAD (peripheral artery disease) ([place])   . On home oxygen therapy     "2L at nighttime" (02/28/2016)  . DIABETES MELLITUS, TYPE II 05/21/2007  . Arthritis   . Chronic pain of lower extremity   . Bladder cancer ([place]) 10/2011  . CHF (congestive heart failure) ([place])     per family, issues in 2012   Past Surgical History  Procedure Laterality Date  . Vasectomy  1990s  . Penile prosthesis implant  1990s  . Cardiovascular stress test  2007  . Cardiac catheterization  04-24-11/  DR MUHAMMAD ARIDA    MILD NONOBSTRUCTIVE CAD, NORMA CARDIAC OUTPUT  . Appendectomy  1941  . Tonsillectomy and adenoidectomy  1962  . Cataract extraction w/ intraocular lens  implant, bilateral Bilateral ~ 2010  . Incision and drainage foot Left ~ 2005    LEFT FOOT DUE TO INFECTION FROM  NAIL PUNCTURE INJURY  . Cystoscopy w/ retrogrades  10/30/2011    Procedure: CYSTOSCOPY WITH RETROGRADE PYELOGRAM;  Surgeon: Molli Hazard, MD;  Location: [facility];  Service: Urology;  Laterality: Bilateral;  CYSTOSCOPY POSS TURBT BILATERAL RETROGRADE PYLEOGRAM   . Transurethral resection of bladder tumor  10/30/2011    Procedure: TRANSURETHRAL RESECTION OF BLADDER TUMOR (TURBT);  Surgeon: Molli Hazard, MD;  Location: [facility];   Service: Urology;  Laterality: N/A;  . Cystoscopy  12/25/2011    Procedure: CYSTOSCOPY;  Surgeon: Molli Hazard, MD;  Location: [facility];  Service: Urology;  Laterality: N/A;  needs intubation and to be paralyzed   . Transurethral resection of bladder tumor  12/25/2011    Procedure: TRANSURETHRAL RESECTION OF BLADDER TUMOR (TURBT);  Surgeon: Molli Hazard, MD;  Location: [facility];  Service: Urology;  Laterality: N/A;  need long gyrus instruments   . Cystoscopy w/ retrogrades  11/30/2012    Procedure: CYSTOSCOPY WITH RETROGRADE PYELOGRAM;  Surgeon: Molli Hazard, MD;  Location[facility];  Service: Urology;  Laterality: Bilateral;  Flexible cystoscopy.  . Cystoscopy with biopsy  11/30/2012    Procedure: CYSTOSCOPY WITH BIOPSY;  Surgeon: Molli Hazard, MD;  Location: [facility];  Service: Urology;  Laterality: N/A;  . Peripheral vascular catheterization N/A 02/14/2016    Procedure: Abdominal Aortogram w/Lower Extremity;  Surgeon: Wellington Hampshire, MD;  Location: [place] [facility];  Service: Cardiovascular;  Laterality: N/A;  . Peripheral vascular catheterization Left 02/28/2016    Procedure: Peripheral Vascular Balloon Angioplasty;  Surgeon: Wellington Hampshire, MD;  Location: [place] [facility];  Service: Cardiovascular;  Laterality: Left;  left peroneal and popliteal artery    Social History:   reports that he quit smoking about 47 years ago. His smoking use included Cigarettes. He has a 44 pack-year smoking history. He has never used smokeless tobacco. He reports that he drinks about 1.2 oz of alcohol per week. He reports that he does not use illicit drugs.    Allergies  Allergen Reactions  . Actos [Pioglitazone Hydrochloride] Other (See Comments)    Held 2012 bladder cancer  . Aspirin Other (See Comments)    Held 2012 due to hematuria and bruising.   . Ace Inhibitors Cough  . Gabapentin      Upset stomach and diarrhea - tolerates low doses  . Lyrica [Pregabalin] Other (See Comments)    swelling  . Pravastatin Other (See Comments)    Swelling  of feet  . Bactrim Rash and Other (See Comments)    Rash, presumed allergy  . Sulfa Drugs Cross Reactors Rash    Family History  Problem Relation Age of Onset  . Diabetes Mother   . Heart disease Brother   . Heart disease Brother   . Heart disease Brother   . Heart disease Brother   . Heart disease Brother   . Heart disease Brother   . Lung cancer Brother       Prior to Admission medications   Medication Sig Start Date End Date Taking? Authorizing Provider  acetaminophen (TYLENOL) 500 MG tablet Take 1,000 mg by mouth every 8 (eight) hours as needed for mild pain, moderate pain or headache.   Yes Historical Provider, MD  amLODipine (NORVASC) 5 MG tablet Take 1 tablet (5 mg total) by mouth daily. 11/10/15  Yes Tonia Ghent, MD  amoxicillin-clavulanate (AUGMENTIN) 875-125 MG tablet Take 1 tablet by mouth 2 (two) times daily. 03/22/16  Yes Trula Slade, DPM  atorvastatin (LIPITOR) 10 MG tablet Take 1 tablet (10 mg total) by mouth daily. 02/23/16  Yes Wellington Hampshire, MD  budesonide-formoterol (SYMBICORT) 160-4.5 MCG/ACT inhaler Inhale 2 puffs into the lungs 2 (two) times daily as needed (shortness of breath).  01/21/14  Yes Tonia Ghent, MD  ciprofloxacin (CIPRO) 500 MG tablet Take 500 mg by mouth 2 (two) times daily.   Yes Historical Provider, MD  clobetasol (TEMOVATE) 0.05 % external solution Apply 1 application topically daily.   Yes Historical Provider, MD  clopidogrel (PLAVIX) 75 MG tablet Take 1 tablet by mouth  daily 02/08/16  Yes Tonia Ghent, MD  collagenase (SANTYL) ointment Apply 1 application topically daily.   Yes Historical Provider, MD  furosemide (LASIX) 40 MG tablet TAKE 1 TABLET BY MOUTH  EVERY MONDAY, WEDNESDAY,  AND FRIDAY 12/01/15  Yes Tonia Ghent, MD  gabapentin (NEURONTIN) 100 MG capsule Take 100 mg  by mouth 3 (three) times daily.   Yes Historical Provider, MD  glipiZIDE (GLUCOTROL XL) 5 MG 24 hr tablet Take 1 tablet by mouth  daily with breakfast 12/01/15  Yes Tonia Ghent, MD  insulin glargine (LANTUS) 100 UNIT/ML injection Inject 25 Units into the skin 3 (three) times daily.  01/21/14  Yes Debbe Odea, MD    Physical Exam: BP 106/48 mmHg  Pulse 78  Temp(Src) 98 F (36.7 C) (Oral)  Resp 18  Ht 6\' 2"  (1.88 m)  Wt 87.998 kg (194 lb)  BMI 24.90 kg/m2  SpO2 98%  GENERAL :   Alert and cooperative, and appears to be in no acute distress. HEAD:           normocephalic. EYES:            PERRL, EOMI.  vision is grossly intact. EARS:           hearing grossly intact. NOSE:           No nasal discharge. THROAT:     Oral cavity and pharynx normal.   NECK:          supple, CARDIAC:    Normal S1 and S2. No gallop. No murmurs.  Vascular:     no peripheral edema.  LUNGS:       Clear to auscultation  ABDOMEN: Positive bowel sounds. Soft, nondistended, nontender. No guarding or rebound.      MSK:           No joint erythema or tenderness. Marland Kitchen  EXT           : No significant deformity or joint abnormality. Neuro        : Alert, oriented to person, place, and time.                      CN II-XII intact.                       Poor sensation in both feet SKIN:           Diabetic foot ulcer on medial side of the left foot, metatarsal joint, stage II/III PSYCH:       No hallucination. Patient is not suicidal.          Labs on Admission:  Reviewed.   Radiological Exams on Admission: Dg Foot Complete Left  03/23/2016  CLINICAL DATA:  82 year old male with infected wound in the medial aspect of the left foot adjacent to the MTP joint. EXAM: LEFT FOOT - COMPLETE 3+ VIEW COMPARISON:  Radiographs dated 03/14/2016 FINDINGS: There is no acute fracture or dislocation. The bones are osteopenic. There is a focal area of decreased mineralization of the distal aspect of the first metatarsal which is new  from prior study. Osteomyelitis is not excluded. Correlation with clinical exam recommended. MRI or white blood cell nuclear scan may provide better evaluation. There is no periosteal elevation or bone erosion. There is skin irregularity medial to the first metatarsophalangeal joint compatible with known skin wound. No radiopaque foreign object identified. No soft tissue gas. IMPRESSION: No acute fracture or dislocation. Skin wound along the medial aspect of the first MTP. There is focal area of decreased mineralization involving the head of the first metatarsal bone adjacent to the skin wound. Osteomyelitis is not excluded. MRI may provide better evaluation. Electronically Signed   By: Anner Crete M.D.   On: 03/23/2016 22:53      Assessment/Plan  Diabetic foot ulcer: Will check MRI to r/o OM Started on Vanc/Zosyn: failed outpatient management.  Pulse palpated in feet, patient recently had angioplasty to left popliteal artery   DMII: continue Lantus, start insulin low dose correction.   CAD: continue asp/plavix, statin   Diabetic neuropathy.  HTN: cont Norvasc     Input & Output: NA Lines & Tubes: PIV DVT prophylaxis: [place] enoxaparin  GI prophylaxis:PPI Consultants: Podiatry  Code Status: Full  Family Communication: at bedside  Disposition Plan: admit to med/surg    Gennaro Africa M.D [facility]

## 2016-03-24 NOTE — Progress Notes (Signed)
Patient seen and examined. Admitted after midnight secondary to diabetic foot ulcer. Patient reported worsening pain, increase discharge and foul odor. No fever and normal WBC's. On exam, patient has stage 3 wound affecting left foot medial aspect/metatarsal joint; foul odor and slightly purulent discharge with surrounding erythema. No CP and no SOB. VS stable. Please refer to H&P written by Dr. Dreama Saa for further info/details on admission.  Plan: -will continue IV antibiotics and supportive care -follow MRI of his foot to r/o osteomyelitis  -base on results will need Orthopedic service inputs and potential amputation  Barton Dubois [id number]

## 2016-03-24 NOTE — ED Notes (Signed)
Attempted report x1. 

## 2016-03-25 ENCOUNTER — Encounter: Payer: Self-pay | Admitting: Podiatry

## 2016-03-25 ENCOUNTER — Telehealth: Payer: Self-pay | Admitting: *Deleted

## 2016-03-25 DIAGNOSIS — M86679 Other chronic osteomyelitis, unspecified ankle and foot: Secondary | ICD-10-CM

## 2016-03-25 LAB — GLUCOSE, CAPILLARY
Glucose-Capillary: 104 mg/dL — ABNORMAL HIGH (ref 65–99)
Glucose-Capillary: 225 mg/dL — ABNORMAL HIGH (ref 65–99)
Glucose-Capillary: 235 mg/dL — ABNORMAL HIGH (ref 65–99)
Glucose-Capillary: 288 mg/dL — ABNORMAL HIGH (ref 65–99)

## 2016-03-25 MED ORDER — DOCUSATE SODIUM 100 MG PO CAPS
100.0000 mg | ORAL_CAPSULE | Freq: Two times a day (BID) | ORAL | Status: DC
  Administered 2016-03-25 – 2016-03-29 (×8): 100 mg via ORAL
  Filled 2016-03-25 (×8): qty 1

## 2016-03-25 MED ORDER — VANCOMYCIN HCL IN DEXTROSE 1-5 GM/200ML-% IV SOLN
1000.0000 mg | INTRAVENOUS | Status: DC
  Administered 2016-03-25: 1000 mg via INTRAVENOUS
  Filled 2016-03-25 (×2): qty 200

## 2016-03-25 MED ORDER — BISACODYL 5 MG PO TBEC
10.0000 mg | DELAYED_RELEASE_TABLET | Freq: Once | ORAL | Status: AC
  Administered 2016-03-25: 10 mg via ORAL
  Filled 2016-03-25: qty 2

## 2016-03-25 NOTE — Clinical Social Work Note (Signed)
Clinical Social Work Assessment  Patient Details  Name: [name] Small MRN: [id number] Date of Birth: [birth year]  Date of referral:  03/25/16               Reason for consult:  Facility Placement                Permission sought to share information with:  Facility Sport and exercise psychologist, Family Supports Permission granted to share information::  Yes, Verbal Permission Granted  Name::     [name] Small  Agency::  [facility] SNFs  Relationship::  Spouse  Contact Information:  [phone]  Housing/Transportation Living arrangements for the past 2 months:  [place] of Information:  Patient, Adult Children Patient Interpreter Needed:  None Criminal Activity/Legal Involvement Pertinent to Current Situation/Hospitalization:  No - Comment as needed Significant Relationships:  Adult Children, Spouse Lives with:  Spouse Do you feel safe going back to the place where you live?  No Need for family participation in patient care:  Yes (Comment)  Care giving concerns:  CSW received referral for possible SNF placement at time of discharge. CSW met with patient and patient's daughter at bedside regarding PT recommendation of SNF placement at time of discharge. Per patient's daughter, patient's wife is currently unable to care for patient at their home given patient's current physical needs and fall risk. Patient and patient's daughter expressed understanding of PT recommendation and are agreeable to SNF placement at time of discharge. CSW to continue to follow and assist with discharge planning needs.   Social Worker assessment / plan:  CSW spoke with patient and patient's daughter concerning possibility of rehab at [facility] before returning home.  Employment status:  Retired Nurse, adult PT Recommendations:  [place] / Referral to community resources:  [place]  Patient/Family's Response to care:  Patient and  patient's daughter recognize need for rehab before returning home and are agreeable to a SNF in [place]. Patient reported preference for [place] since that is near patient's home.  Patient/Family's Understanding of and Emotional Response to Diagnosis, Current Treatment, and Prognosis:  Patient is realistic regarding therapy needs. No questions/concerns about plan or treatment.    Emotional Assessment Appearance:  Appears stated age Attitude/Demeanor/Rapport:   (Appropriate) Affect (typically observed):  Accepting, Appropriate, Pleasant Orientation:  Oriented to Situation, Oriented to  Time, Oriented to Place, Oriented to Self Alcohol / Substance use:  Not Applicable Psych involvement (Current and /or in the community):  No (Comment)  Discharge Needs  Concerns to be addressed:  Care Coordination Readmission within the last 30 days:  No Current discharge risk:  None Barriers to Discharge:  Continued Medical Work up   [employer], [place] 03/25/2016, 5:43 PM

## 2016-03-25 NOTE — Telephone Encounter (Signed)
"  I'm calling to let you know that we are not going to be able to do patient's surgery here at this surgery center.  He has Homo2 and chronic renal failure.  He'll have to be done at the Main OR."

## 2016-03-25 NOTE — Progress Notes (Signed)
Reviewed pt's history with Dr. Marcell Barlow - surgery canceled here, needs to be moved to main OR due to on home oxygen at night, chronic renal disease stage 3. Spoke with [id number] - surgical coordinator at Dr. Leigh Aurora office.

## 2016-03-25 NOTE — Consult Note (Signed)
   [facility] CM Inpatient Consult   03/25/2016  [name] Small July 18, 1928 [id number]   Referral received to assess for care management services. Admitted with diabetic L foot ulcer. Hx of DMII, CKD, CAD, PVD, Afib, CHF.   Met with the patient and his wife regarding the benefits of [facility] Care Management services.  Explained that [place] [facility] is a covered benefit of his [facility].  They endorse Dr. [name] as his primary care provider.   Review information for [facility] Care Management and a folder was provided with contact information.  Explained that [place] [facility] does not interfere with or replace any services arranged by the inpatient care management staff.  Patient declined services with [place] [facility] at this time stating, "We are waiting to see if he is going to need surgery on that foot and then he will likely go to rehab."  They thanked this Probation officer for the information and a brochure and contact information was provided.  Chart encounter reveals that the Physical Therapist is recommending a skilled nursing facility for rehab prior to his surgery.  For questions, please contact:  Natividad Brood, RN BSN [facility] Liaison  [phone] business mobile phone Toll free office [phone]

## 2016-03-25 NOTE — Evaluation (Signed)
Physical Therapy Evaluation Patient Details Name: [name] Small MRN: [id number] DOB: 1928/08/04 Today's Date: 03/25/2016   History of Present Illness  Admitted with diabetic L foot ulcer. Hx of DMII, CKD, CAD, PVD, Afib, CHF. From home with wife. PTA (sickness), pt independent with ADL's. Owns cane and uses home Oxygen  Clinical Impression  Patient demonstrates deficits in functional mobility as indicated below. Will need continued skilled PT to address deficits and maximize function. Will see as indicated and progress as tolerated. OF NOTE: Patient with several falls recently and significant instability with ambulation. Patient also very impulsive with all aspects of mobility. Spoke with patient and family at length. Daughters present at bedside and state that patient will need to be safely independent with mobility at this time, patient requiring assist and remains significantly HIGH fall risk. Would benefit from [name] upon acute discharge to ease burden of care and progress functional levels to independence.  Will follow pending decisions related to Left foot wound.    Follow Up Recommendations SNF;Supervision/Assistance - 24 hour    Equipment Recommendations  None recommended by PT    Recommendations for Other Services       Precautions / Restrictions Precautions Precautions: Fall Restrictions Weight Bearing Restrictions: No      Mobility  Bed Mobility Overal bed mobility: Needs Assistance Bed Mobility: Supine to Sit;Sit to Supine     Supine to sit: Supervision Sit to supine: Supervision   General bed mobility comments: impulsively come to EOB with increased speed, VCs for safety and to slow transitions  Transfers Overall transfer level: Needs assistance Equipment used: None Transfers: Sit to/from Stand Sit to Stand: Min assist         General transfer comment: min assist for stability in coming to standing, increased sway  noted  Ambulation/Gait Ambulation/Gait assistance: Min assist;Mod assist (several noted LOB requiring increased assist to prevent fall) Ambulation Distance (Feet): 110 Feet Assistive device: 1 person hand held assist Gait Pattern/deviations: Step-through pattern;Decreased stride length;Ataxic;Staggering right;Narrow base of support Gait velocity: decreased   General Gait Details: patient significantly unsteady with ambulation, several significant LOB to the right side requiring increased physical assist with wrap around use of gait belt to prevent patient from falling. Patient also utilizing furniture and counters to "surf" through obstacles in room. patient with impulsivity noted. HIGH FALL RISK  Stairs            Wheelchair Mobility    Modified Rankin (Stroke Patients Only)       Balance Overall balance assessment: History of Falls (patient has had serval falls in past few wks per family)                                           Pertinent Vitals/Pain Pain Assessment: No/denies pain    Home Living Family/patient expects to be discharged to:: Private residence Living Arrangements: Spouse/significant other Available Help at Discharge: Family;Available PRN/intermittently Type of Home: House Home Access: Stairs to enter Entrance Stairs-Rails: None Entrance Stairs-Number of Steps: 3 Home Layout: One level Home Equipment: Cane - single point      Prior Function Level of Independence: Independent with assistive device(s)         Comments: uses cane and furniture walks, daughters reports several incidence of LEs giving out and patient falls     Hand Dominance   Dominant Hand: Left  Extremity/Trunk Assessment   Upper Extremity Assessment: Overall WFL for tasks assessed           Lower Extremity Assessment: LLE deficits/detail   LLE Deficits / Details: LLE foot ulcer     Communication   Communication: No difficulties  Cognition  Arousal/Alertness: Awake/alert Behavior During Therapy: Impulsive Overall Cognitive Status: Impaired/Different from baseline Area of Impairment: Safety/judgement         Safety/Judgement: Decreased awareness of safety;Decreased awareness of deficits     General Comments: patient highly impulsive to the extent of unsafe mobility and risk for injury    General Comments General comments (skin integrity, edema, etc.): bruising noted over left forearm from recent fall    Exercises        Assessment/Plan    PT Assessment Patient needs continued PT services  PT Diagnosis Difficulty walking;Abnormality of gait   PT Problem List Decreased activity tolerance;Decreased balance;Decreased mobility;Decreased coordination;Decreased safety awareness  PT Treatment Interventions DME instruction;Gait training;Stair training;Functional mobility training;Therapeutic activities;Therapeutic exercise;Balance training;Patient/family education   PT Goals (Current goals can be found in the Care Plan section) Acute Rehab PT Goals Patient Stated Goal: to get back to indpendence PT Goal Formulation: With patient/family Time For Goal Achievement: 04/08/16 Potential to Achieve Goals: Good    Frequency Min 3X/week   Barriers to discharge Decreased caregiver support wife is unable to provide adequate assist    Co-evaluation               End of Session Equipment Utilized During Treatment: Gait belt;Oxygen Activity Tolerance: Patient tolerated treatment well Patient left: in bed;with call bell/phone within reach;with bed alarm set;with family/visitor present Nurse Communication: Mobility status;Precautions         Time: [id number] PT Time Calculation (min) (ACUTE ONLY): 26 min   Charges:   PT Evaluation $PT Eval Moderate Complexity: 1 Procedure PT Treatments $Gait Training: 8-22 mins   PT G CodesDuncan Small 2016-04-16, 1:31 PM [name] Small, [place] DPT  [phone]

## 2016-03-25 NOTE — Progress Notes (Signed)
PROGRESS NOTE    [name] Small  [name] DOB: February 08, 1928 DOA: 03/23/2016 PCP: [name], MD  Outpatient Specialists:   Dr. Will Bonnet Dr. Con Memos at the wound clinic  Dr. Marcene Duos Dr. Kerry Kass [facility]   Brief Narrative:   82 y/o ? ty II DM CKD III  CAD -stress test for DOE, it showed mostly fixed inferior wall defect with minimal periinfarct ischemia. He was started on pravastatin that he couldn't tolerate and Plavix as he is allergic to aspirin -R + L Heart cath 04/2011 ,Non-ob CAD CHF Afib Bladder Ca s/p Chemo PVD -noninvasive vascular evaluation in 3/2017showed an ABI of 1.0 on the right and 0.71 on the left -admission 419-->02/29/16 critical LE ischemia, L metatarsal head wound       Assessment & Plan:   Active Problems:   Diabetic ulcer of left foot (HCC)   Diabetic ulcer of left foot associated with diabetes mellitus due to underlying condition ([place])   Early Osteo -consulted Dr. Clementeen Graham DPM for evalaution/managemetn LE wound as might need operative management[? debridement vs lis-franc vs bka] -Continue Vanc/Zosyn    DVT prophylaxis: Lovenox Code Status: Full Family Communication: 3 daughters at bedside Disposition Plan: unclear at +   Consultants:   Dr. Clementeen Graham  Procedures:   MRi  Antimicrobials:   Vancomycin  Zosyn    Subjective:  Alert pleasant oriented in nad No fever  No cp  Objective: Filed Vitals:   03/24/16 2253 03/25/16 0544 03/25/16 1138 03/25/16 1144  BP: 101/46 98/42 115/54 115/54  Pulse: 64 65  69  Temp: 97.9 F (36.6 C) 97.9 F (36.6 C)    TempSrc:      Resp: 18 18    Height:      Weight:      SpO2: 94% 96%      Intake/Output Summary (Last 24 hours) at 03/25/16 1229 Last data filed at 03/24/16 1853  Gross per 24 hour  Intake    100 ml  Output    300 ml  Net   -200 ml   Filed Weights   03/23/16 2116 03/24/16 0137  Weight: 87.998 kg (194 lb) 91.536 kg (201 lb 12.8 oz)     Examination:  General exam: Appears calm and comfortable  Respiratory system: Clear to auscultation. Respiratory effort normal. Cardiovascular system: S1 & S2 heard, RRR. No JVD, murmurs, rubs, gallops or clicks. No pedal edema. Gastrointestinal system: Abdomen is nondistended, soft and nontender. No organomegaly or masses felt. Normal bowel sounds heard. Central nervous system: Alert and oriented. No focal neurological deficits. Extremities: Symmetric 5 x 5 power. Skin:   Psychiatry: Judgement and insight appear normal. Mood & affect appropriate.     Data Reviewed: I have personally reviewed following labs and imaging studies  CBC:  Recent Labs Lab 03/23/16 2304  WBC 9.8  NEUTROABS 6.9  HGB 12.9*  HCT 40.1  MCV 98.0  PLT 123XX123   Basic Metabolic Panel:  Recent Labs Lab 03/23/16 2304  NA 136  K 4.2  CL 97*  CO2 28  GLUCOSE 109*  BUN 38*  CREATININE 2.20*  CALCIUM 8.9   GFR: Estimated Creatinine Clearance: 28.9 mL/min (by C-G formula based on Cr of 2.2). Liver Function Tests: No results for input(s): AST, ALT, ALKPHOS, BILITOT, PROT, ALBUMIN in the last 168 hours. No results for input(s): LIPASE, AMYLASE in the last 168 hours. No results for input(s): AMMONIA in the last 168 hours. Coagulation Profile: No results for input(s): INR, PROTIME in the  last 168 hours. Cardiac Enzymes: No results for input(s): CKTOTAL, CKMB, CKMBINDEX, TROPONINI in the last 168 hours. BNP (last 3 results) No results for input(s): PROBNP in the last 8760 hours. HbA1C: No results for input(s): HGBA1C in the last 72 hours. CBG:  Recent Labs Lab 03/24/16 1232 03/24/16 1927 03/24/16 2204 03/24/16 2251 03/25/16 0600  GLUCAP 131* 242* 219* 202* 104*   Lipid Profile: No results for input(s): CHOL, HDL, LDLCALC, TRIG, CHOLHDL, LDLDIRECT in the last 72 hours. Thyroid Function Tests: No results for input(s): TSH, T4TOTAL, FREET4, T3FREE, THYROIDAB in the last 72 hours. Anemia  Panel: No results for input(s): VITAMINB12, FOLATE, FERRITIN, TIBC, IRON, RETICCTPCT in the last 72 hours. Urine analysis:    Component Value Date/Time   COLORURINE YELLOW 01/18/2014 0719   APPEARANCEUR CLEAR 01/18/2014 0719   LABSPEC 1.020 01/18/2014 0719   PHURINE 6.0 01/18/2014 0719   GLUCOSEU >1000* 01/18/2014 0719   HGBUR NEGATIVE 01/18/2014 0719   HGBUR trace-intact 11/25/2007 0937   BILIRUBINUR NEGATIVE 01/18/2014 0719   KETONESUR NEGATIVE 01/18/2014 0719   PROTEINUR 30* 01/18/2014 0719   UROBILINOGEN 0.2 01/18/2014 0719   NITRITE NEGATIVE 01/18/2014 0719   LEUKOCYTESUR NEGATIVE 01/18/2014 0719   Recent Results (from the past 240 hour(s))  Wound culture     Status: None   Collection Time: 03/21/16  2:08 PM  Result Value Ref Range Status   Specimen Description WOUND  Final   Special Requests NONE  Final   Gram Stain   Final    FEW WBC PRESENT, PREDOMINANTLY PMN FEW GRAM POSITIVE COCCI RARE GRAM NEGATIVE RODS    Culture   Final    MODERATE GROWTH ESCHERICHIA COLI LIGHT GROWTH PSEUDOMONAS AERUGINOSA MODERATE GROWTH ENTEROCOCCUS FAECALIS    Report Status 03/24/2016 FINAL  Final   Organism ID, Bacteria ESCHERICHIA COLI  Final   Organism ID, Bacteria PSEUDOMONAS AERUGINOSA  Final   Organism ID, Bacteria ENTEROCOCCUS FAECALIS  Final      Susceptibility   Escherichia coli - MIC*    AMPICILLIN 4 SENSITIVE Sensitive     CEFAZOLIN <=4 SENSITIVE Sensitive     CEFEPIME <=1 SENSITIVE Sensitive     CEFTAZIDIME <=1 SENSITIVE Sensitive     CEFTRIAXONE <=1 SENSITIVE Sensitive     CIPROFLOXACIN <=0.25 SENSITIVE Sensitive     GENTAMICIN <=1 SENSITIVE Sensitive     IMIPENEM <=0.25 SENSITIVE Sensitive     TRIMETH/SULFA <=20 SENSITIVE Sensitive     AMPICILLIN/SULBACTAM <=2 SENSITIVE Sensitive     PIP/TAZO <=4 SENSITIVE Sensitive     Extended ESBL NEGATIVE Sensitive     * MODERATE GROWTH ESCHERICHIA COLI   Pseudomonas aeruginosa - MIC*    CEFTAZIDIME 4 SENSITIVE Sensitive      CIPROFLOXACIN <=0.25 SENSITIVE Sensitive     GENTAMICIN <=1 SENSITIVE Sensitive     IMIPENEM 2 SENSITIVE Sensitive     CEFEPIME 2 SENSITIVE Sensitive     PIP/TAZO Value in next row Sensitive      SENSITIVE8    * LIGHT GROWTH PSEUDOMONAS AERUGINOSA   Enterococcus faecalis - MIC*    AMPICILLIN Value in next row Sensitive      SENSITIVE8    VANCOMYCIN Value in next row Sensitive      SENSITIVE8    GENTAMICIN SYNERGY Value in next row Sensitive      SENSITIVE8    LINEZOLID Value in next row Sensitive      SENSITIVE8    * MODERATE GROWTH ENTEROCOCCUS FAECALIS  Blood culture (routine x 2)  Status: None (Preliminary result)   Collection Time: 03/23/16  9:45 PM  Result Value Ref Range Status   Specimen Description BLOOD LEFT ANTECUBITAL  Final   Special Requests BOTTLES DRAWN AEROBIC ONLY 10CC  Final   Culture NO GROWTH < 12 HOURS  Final   Report Status PENDING  Incomplete  Blood culture (routine x 2)     Status: None (Preliminary result)   Collection Time: 03/23/16  9:50 PM  Result Value Ref Range Status   Specimen Description BLOOD LEFT HAND  Final   Special Requests IN PEDIATRIC BOTTLE 3CC  Final   Culture NO GROWTH < 12 HOURS  Final   Report Status PENDING  Incomplete     Radiology Studies: Mr Foot Left Wo Contrast  03/24/2016  CLINICAL DATA:  Diabetic foot ulcer of the left foot. Surrounding erythema and purulent discharge. EXAM: MRI OF THE LEFT FOREFOOT WITHOUT CONTRAST TECHNIQUE: Multiplanar, multisequence MR imaging was performed. No intravenous contrast was administered. COMPARISON:  None. FINDINGS: Bones/Joint/Cartilage Soft tissue ulcer overlying the medial aspect of the first metatarsal head with mild underlying cortical irregularity with associated marrow edema most concerning for new osteomyelitis. Marrow edema within the medial hallux sesamoid. No other marrow signal abnormality. No fracture or dislocation. Normal alignment. No joint effusion. Collaterals Collateral  ligaments are intact. Tendons Flexor and extensor compartment tendons are intact. Muscles Normal. Soft tissue No fluid collection or hematoma.  No soft tissue mass. IMPRESSION: 1. Soft tissue ulcer overlying the medial aspect of the first metatarsal head. Mild underlying cortical irregularity with associated marrow edema most concerning for new osteomyelitis. Marrow edema in the medial hallux sesamoid without cortical destruction which may reflect reactive marrow changes versus early osteomyelitis. Electronically Signed   By: Kathreen Devoid   On: 03/24/2016 15:46   Dg Foot Complete Left  03/23/2016  CLINICAL DATA:  82 year old male with infected wound in the medial aspect of the left foot adjacent to the MTP joint. EXAM: LEFT FOOT - COMPLETE 3+ VIEW COMPARISON:  Radiographs dated 03/14/2016 FINDINGS: There is no acute fracture or dislocation. The bones are osteopenic. There is a focal area of decreased mineralization of the distal aspect of the first metatarsal which is new from prior study. Osteomyelitis is not excluded. Correlation with clinical exam recommended. MRI or white blood cell nuclear scan may provide better evaluation. There is no periosteal elevation or bone erosion. There is skin irregularity medial to the first metatarsophalangeal joint compatible with known skin wound. No radiopaque foreign object identified. No soft tissue gas. IMPRESSION: No acute fracture or dislocation. Skin wound along the medial aspect of the first MTP. There is focal area of decreased mineralization involving the head of the first metatarsal bone adjacent to the skin wound. Osteomyelitis is not excluded. MRI may provide better evaluation. Electronically Signed   By: Anner Crete M.D.   On: 03/23/2016 22:53   Scheduled Meds: . amLODipine  5 mg Oral Daily  . atorvastatin  10 mg Oral Daily  . clobetasol ointment  1 application Topical Daily  . clopidogrel  75 mg Oral Daily  . enoxaparin (LOVENOX) injection  30 mg  Subcutaneous Q24H  . furosemide  40 mg Oral QODAY  . gabapentin  100 mg Oral TID  . insulin aspart  0-9 Units Subcutaneous TID WC  . insulin glargine  25 Units Subcutaneous Q24H  . mometasone-formoterol  2 puff Inhalation BID  . piperacillin-tazobactam (ZOSYN)  IV  3.375 g Intravenous Q8H  . vancomycin  1,250  mg Intravenous Once  . vancomycin  1,000 mg Intravenous Q24H   Continuous Infusions:    LOS: 1 day    Time spent: [place], MD [facility] ([facility]  If 7PM-7AM, please contact night-coverage [email or web address] Password [email or web address] 03/25/2016, 12:29 PM

## 2016-03-25 NOTE — Care Management Note (Addendum)
Case Management Note  Patient Details  Name: DERIC VIETMEIER MRN: [id number] Date of Birth: [birth year]  Subjective/Objective:         Admitted with diabetic L foot ulcer. Hx of DMII, CKD, CAD, PVD, Afib, CHF.  From home with wife. PTA (sickness), pt independent with ADL's. Owns cane and uses home Oxygen. PCP: [name].        Action/Plan:  PT to evaluate ....... CM to f/u with disposition needs  Expected Discharge Date:                  Expected Discharge Plan:  Home/Self Care (Lives with wife [name])  In-House Referral:     Discharge planning Services  CM Consult  Post Acute Care Choice:    Choice offered to:     DME Arranged:  Pt owns can DME Agency:  [employer] oxygen  HH Arranged:    HH Agency:     Status of Service:  In process, will continue to follow  Medicare Important Message Given:    Date Medicare IM Given:    Medicare IM give by:    Date Additional Medicare IM Given:    Additional Medicare Important Message give by:     If discussed at [place] of Stay Meetings, dates discussed:    Additional Comments: Alexus Kaczanowski (Spouse) [phone]  [place], [place] [phone] 03/25/2016, 10:56 AM

## 2016-03-25 NOTE — Consult Note (Signed)
Subjective: [name] Small was admitted on Saturday 03/23/16 due to left foot infection. I saw him in the office on 03/22/16. He was having increased blood sugar and night sweats at that time which made him go to the ER. He is currently on vancomycin and zosyn. He states the leg was swollen in the ER but since on IV antibiotics the swelling has decreased. He is having no pain to the foot. No other compalints  Objective: Blood pressure 112/44, pulse 70, temperature 97.9 F (36.6 C), temperature source Oral, resp. rate 20, height 6' 2.4" (1.89 m), weight 201 lb 12.8 oz (91.536 kg), SpO2 95 %. AAO x 3, NAD DP pulse decreased on left but able to palpate PT pulse On the medial aspect of the left 1st MTPJ is a necrotic wound measuring 3 x 2.5cm. There is increased necrotic tissue in the wound compared to Friday's appointment. The malodor has greatly improved. There is mild edema around the 1st MTPJ on the left. No significant cellulitis and no ascending cellulitis. No areas of fluctuance or crepitance. No other open lesions are present at this time.  No pain with calf compression, swelling, warmth, erythema.   Radiology: MRI 03/24/16 IMPRESSION: 1. Soft tissue ulcer overlying the medial aspect of the first metatarsal head. Mild underlying cortical irregularity with associated marrow edema most concerning for new osteomyelitis. Marrow edema in the medial hallux sesamoid without cortical destruction which may reflect reactive marrow changes versus early Osteomyelitis.  X-ray 03/23/16: IMPRESSION: No acute fracture or dislocation.  Skin wound along the medial aspect of the first MTP. There is focal area of decreased mineralization involving the head of the first metatarsal bone adjacent to the skin wound. Osteomyelitis is not excluded. MRI may provide better evaluation.  Labs: CBC Latest Ref Rng 03/23/2016 03/04/2016 02/29/2016  WBC 4.0 - 10.5 K/uL 9.8 10.4 8.8  Hemoglobin 13.0 - 17.0 g/dL 12.9(L) 14.1  13.4  Hematocrit 39.0 - 52.0 % 40.1 42.7 41.7  Platelets 150 - 400 K/uL 251 230 205   BMP Latest Ref Rng 03/23/2016 03/04/2016 02/29/2016  Glucose 65 - 99 mg/dL 109(H) 212(H) 127(H)  BUN 6 - 20 mg/dL 38(H) 23 28(H)  Creatinine 0.61 - 1.24 mg/dL 2.20(H) 1.48(H) 1.65(H)  Sodium 135 - 145 mmol/L 136 139 143  Potassium 3.5 - 5.1 mmol/L 4.2 4.9 4.2  Chloride 101 - 111 mmol/L 97(L) 98 105  CO2 22 - 32 mmol/L 28 32(H) 28  Calcium 8.9 - 10.3 mg/dL 8.9 8.4(L) 8.1(L)   Erythrocyte Sedimentation Rate     Component Value Date/Time   ESRSEDRATE 106* 03/23/2016 2304   C-Reactive Protein  No results found for: CRP  Assessment: 82 year old male with left 1st MTPJ ulcer, osteomyelitis  Plan: -Treatment options discussed including all alternatives, risks, and complications -Reviewed MRI with the patient -I had a long discussion with the patient, wife, and daughters in regards to treatment options. Discussed limb salvage vs. Amputation. He does not want long term antibiotics and wishes to proceed with amputation. I discussed with him a partial 1st ray amputation, and may need a wound VAC given the wound. I dicussed the potential for non-healing and this may end in a proximal amputation which they understand. Even if we don't do surgery this wound is not healing and he may end up with amputation.  -I do think his DP pulse has decreased since I last saw him. I would recommend consulting Dr. Fletcher Anon to evaluate prior to surgery.  -Continue vancomycin/pip-tazo -Care per primary team.  -  He is scheduled for surgery on Wednesday. Would appreciate Dr. Jacklynn Ganong input at well prior to surgery -I will see him tomorrow. If you have any questions, please feel fee to call me directly at [phone] or you can call the office at [phone]. Thank you for the consult.  Annice Needy, DPM     Assessment:

## 2016-03-25 NOTE — Telephone Encounter (Signed)
Pt's dtr, Dale Toomsuba [phone], states pt is in the hospital and would like Dr. Jacqualyn Posey to continue care, pt is on antibiotic and has had MRI and blood work.  Dr. Jacqualyn Posey states inform pt and his family, he would be happy to continue to see the pt, but a referral must come from the hospital doctor currently treating pt, as well as any medications.  Dawn states understanding and will speak with the hospital doctor today.

## 2016-03-25 NOTE — NC FL2 (Signed)
[place] LEVEL OF CARE SCREENING TOOL     IDENTIFICATION  Patient Name: [name] Small Birthdate: [birth year] Sex: male Admission Date (Current Location): 03/23/2016  [facility] and [place] Number:  Herbalist and Address:         Provider Number: [id number]  Attending Physician Name and Address:  Nita Sells, MD  Relative Name and Phone Number:  [name], spouse, [phone]    Current Level of Care: Hospital Recommended Level of Care: [place] Prior Approval Number:    Date Approved/Denied:   PASRR Number: [id number]  Discharge Plan: SNF    Current Diagnoses: Patient Active Problem List   Diagnosis Date Noted  . Diabetic ulcer of left foot ([place]) 03/24/2016  . Diabetic ulcer of left foot associated with diabetes mellitus due to underlying condition ([place])   . Weakness 03/18/2016  . Mood disorder ([place]) 03/18/2016  . Groin hematoma   . Chronic renal failure   . Critical lower limb ischemia 02/28/2016  . Peripheral arterial disease ([place]) 10/17/2015  . Edema 07/19/2015  . Claudication ([place]) 01/31/2015  . DOE (dyspnea on exertion) 01/31/2015  . Hyperlipidemia 01/31/2015  . HTN (hypertension) 01/31/2015  . Chronic diastolic CHF (congestive heart failure) ([place]) 01/21/2014  . Seborrheic keratoses 04/30/2013  . CHF (congestive heart failure) ([place]) 12/16/2012  . Atrial fibrillation ([place]) 12/16/2012  . Hyperkalemia 11/27/2011  . Bladder cancer ([place]) 09/22/2011  . Leg cramps 08/28/2011  . Cough 05/26/2011  . CAD (coronary artery disease) of artery bypass graft 05/09/2011  . Dyspnea on exertion 04/19/2011  . PSORIASIS, SCALP 10/25/2008  . Chronic kidney disease, stage III (moderate) 12/04/2007  . Diabetes with neurologic complications ([place]) [id number]  . DIVERTICULOSIS, COLON 05/21/2007  . BENIGN PROSTATIC HYPERTROPHY 05/21/2007    Orientation RESPIRATION BLADDER Height & Weight     Self, Time, Situation, Place  O2  (Nasal Cannula 2.5L) Continent Weight: 91.536 kg (201 lb 12.8 oz) Height:  6' 2.4" (189 cm)  BEHAVIORAL SYMPTOMS/MOOD NEUROLOGICAL BOWEL NUTRITION STATUS      Continent Diet (Please see DC Summary)  AMBULATORY STATUS COMMUNICATION OF NEEDS Skin   Limited Assist Verbally Other (Comment) (Ulcer on foot)                       Personal Care Assistance Level of Assistance  Bathing, Feeding, Dressing Bathing Assistance: Limited assistance Feeding assistance: Independent Dressing Assistance: Limited assistance     Functional Limitations Info             SPECIAL CARE FACTORS FREQUENCY  PT (By licensed PT)     PT Frequency: min 3x/week              Contractures      Additional Factors Info  Code Status, Allergies, Insulin Sliding Scale Code Status Info: DNR Allergies Info: Actos, Aspirin, Ace Inhibitors, Gabapentin, Lyrica, Pravastatin, Bactrim, Sulfa Drugs Cross Reactors   Insulin Sliding Scale Info: insulin aspart (novoLOG) injection 0-9 Units;insulin glargine (LANTUS) injection 25 Units;       Current Medications (03/25/2016):  This is the current hospital active medication list Current Facility-Administered Medications  Medication Dose Route Frequency Provider Last Rate Last Dose  . acetaminophen (TYLENOL) tablet 1,000 mg  1,000 mg Oral Q8H PRN Gennaro Africa, MD      . amLODipine (NORVASC) tablet 5 mg  5 mg Oral Daily Gennaro Africa, MD   5 mg at 03/25/16 1138  . atorvastatin (LIPITOR) tablet 10 mg  10 mg  Oral Daily Gennaro Africa, MD   10 mg at 03/25/16 1145  . clobetasol ointment (TEMOVATE) [id number] % 1 application  1 application Topical Daily Gennaro Africa, MD   1 application at [id number]  . clopidogrel (PLAVIX) tablet 75 mg  75 mg Oral Daily Gennaro Africa, MD   75 mg at 03/25/16 1138  . enoxaparin (LOVENOX) injection 30 mg  30 mg Subcutaneous Q24H Barton Dubois, MD   30 mg at 03/25/16 1322  . furosemide (LASIX) tablet 40 mg  40 mg Oral Harless Nakayama, MD   40 mg at  03/24/16 1105  . gabapentin (NEURONTIN) capsule 100 mg  100 mg Oral TID Gennaro Africa, MD   100 mg at 03/25/16 1146  . insulin aspart (novoLOG) injection 0-9 Units  0-9 Units Subcutaneous TID WC Gennaro Africa, MD   3 Units at 03/25/16 1258  . insulin glargine (LANTUS) injection 25 Units  25 Units Subcutaneous Q24H Edwin Dada, MD   25 Units at 03/24/16 1956  . mometasone-formoterol (DULERA) 200-5 MCG/ACT inhaler 2 puff  2 puff Inhalation BID Gennaro Africa, MD   2 puff at 03/25/16 1329  . piperacillin-tazobactam (ZOSYN) IVPB 3.375 g  3.375 g Intravenous Q8H Leo Grosser, MD   3.375 g at 03/25/16 1322  . vancomycin (VANCOCIN) 1,250 mg in sodium chloride 0.9 % 250 mL IVPB  1,250 mg Intravenous Once Elnora Morrison, MD   1,250 mg at 03/24/16 0035  . vancomycin (VANCOCIN) IVPB 1000 mg/200 mL premix  1,000 mg Intravenous Q24H Leodis Sias, [place] [facility]         Discharge Medications: Please see discharge summary for a list of discharge medications.  Relevant Imaging Results:  Relevant Lab Results:   Additional Information SSN: [id number] [place], [place]

## 2016-03-26 ENCOUNTER — Encounter (HOSPITAL_COMMUNITY): Payer: Self-pay | Admitting: General Practice

## 2016-03-26 ENCOUNTER — Inpatient Hospital Stay (HOSPITAL_COMMUNITY): Payer: Medicare Other

## 2016-03-26 DIAGNOSIS — M869 Osteomyelitis, unspecified: Secondary | ICD-10-CM

## 2016-03-26 LAB — BASIC METABOLIC PANEL
Anion gap: 11 (ref 5–15)
BUN: 30 mg/dL — ABNORMAL HIGH (ref 6–20)
CO2: 32 mmol/L (ref 22–32)
Calcium: 8.5 mg/dL — ABNORMAL LOW (ref 8.9–10.3)
Chloride: 94 mmol/L — ABNORMAL LOW (ref 101–111)
Creatinine, Ser: 2.14 mg/dL — ABNORMAL HIGH (ref 0.61–1.24)
GFR calc Af Amer: 31 mL/min — ABNORMAL LOW (ref >60)
GFR calc non Af Amer: 26 mL/min — ABNORMAL LOW (ref >60)
Glucose, Bld: 220 mg/dL — ABNORMAL HIGH (ref 65–99)
Potassium: 4.3 mmol/L (ref 3.5–5.1)
Sodium: 137 mmol/L (ref 135–145)

## 2016-03-26 LAB — GLUCOSE, CAPILLARY
Glucose-Capillary: 197 mg/dL — ABNORMAL HIGH (ref 65–99)
Glucose-Capillary: 281 mg/dL — ABNORMAL HIGH (ref 65–99)
Glucose-Capillary: 339 mg/dL — ABNORMAL HIGH (ref 65–99)
Glucose-Capillary: 237 mg/dL — ABNORMAL HIGH (ref 65–99)

## 2016-03-26 MED ORDER — AMLODIPINE BESYLATE 2.5 MG PO TABS
2.5000 mg | ORAL_TABLET | Freq: Every day | ORAL | Status: DC
  Administered 2016-03-27 – 2016-03-29 (×3): 2.5 mg via ORAL
  Filled 2016-03-26 (×3): qty 1

## 2016-03-26 MED ORDER — VANCOMYCIN HCL IN DEXTROSE 1-5 GM/200ML-% IV SOLN
1000.0000 mg | INTRAVENOUS | Status: DC
  Administered 2016-03-26 – 2016-03-28 (×3): 1000 mg via INTRAVENOUS
  Filled 2016-03-26 (×4): qty 200

## 2016-03-26 MED ORDER — CIPROFLOXACIN IN D5W 400 MG/200ML IV SOLN
400.0000 mg | INTRAVENOUS | Status: DC
  Administered 2016-03-26 – 2016-03-27 (×2): 400 mg via INTRAVENOUS
  Filled 2016-03-26 (×2): qty 200

## 2016-03-26 NOTE — Telephone Encounter (Signed)
Per Dr. Jacqualyn Posey I called and informed Tiffany, surgery scheduler, to change patient's surgery to a partial 1st ray amputation.

## 2016-03-26 NOTE — Progress Notes (Signed)
PT Cancellation Note  Patient Details Name: [name] Small MRN: [id number] DOB: 11-17-1927   Cancelled Treatment:    Reason Eval/Treat Not Completed: Patient declined, no reason specifiedPt declined mobility and reported he just spoke with MD about amputation tomorrow. PT will check on pt later as time allows.     Salina April, PTA Pager: [phone]   03/26/2016, 12:06 PM

## 2016-03-26 NOTE — Progress Notes (Signed)
VASCULAR LAB PRELIMINARY  ARTERIAL  ABI completed:    RIGHT    LEFT    PRESSURE WAVEFORM  PRESSURE WAVEFORM  BRACHIAL 134 Triphasic BRACHIAL 129 Triphasic  DP 132 Biphasic DP 111 Monophasic  PT 122 Triphasic PT 131 Biphasic    RIGHT LEFT  ABI 0.99 0.98   ABIs indicate normal arterial flow at rest bilaterally.  Macklyn Glandon, [place], RVS 03/26/2016, 4:34 PM

## 2016-03-26 NOTE — Progress Notes (Signed)
PROGRESS NOTE    [name] Small  [id number] DOB: 03-Aug-1928 DOA: 03/23/2016 PCP: [name], MD  Outpatient Specialists:   Dr. Will Bonnet Dr. Con Memos at the wound clinic  Dr. Marcene Duos Dr. Kerry Kass [facility]   Brief Narrative:   82 y/o ? ty II DM CKD III  CAD -stress test for DOE, it showed mostly fixed inferior wall defect with minimal periinfarct ischemia. He was started on pravastatin that he couldn't tolerate and Plavix as he is allergic to aspirin -R + L Heart cath 04/2011 ,Non-ob CAD CHF Afib Bladder Ca s/p Chemo PVD -noninvasive vascular evaluation in 01/2016 showed an ABI of 1.0 on the right and 0.71 on the left -admission 4/19-->02/29/16 critical LE ischemia, L metatarsal head wound.  He underwent an angioplasty at that time which showed good blood flow   Patient admitted 03/24/2016 with losing and malodorous wound which was initially treated with Cipro and then Augmentin and he finally came to the emergency room for this   Assessment & Plan:   Active Problems:   Diabetic ulcer of left foot (HCC)   Diabetic ulcer of left foot associated with diabetes mellitus due to underlying condition ([place])   Early Osteo -consulted Dr. Clementeen Graham DPM for evalaution/management LE wound as might need operative management [? debridement vs lis-franc vs bka] -Discussed patient's management with  Dr. Fletcher Anon who did the angioplasty 4/19 -It appears that patient's options are varied and I have deferred decision-making to both podiatry and Dr. Fletcher Anon -Continue Vanc/Zosyn  Hypertension -cut back dose of Norvasc 5 [id number], systolic blood pressure is low 100 range  Diabetes mellitus with complication of diabetic nephropathy and diabetic foot ulcer -Continue Lantus 24 units, sliding scale coverage -Continue gabapentin 100 3 times a day -Home medication Glucotrol 5 daily on hold -Blood sugars reasonably controlled and 90--220 range range overnight  Chronic  kidney disease stage III -Creatinine around baseline-GFR about 30 -Avoid nephrotoxins--- home dose Lasix 40 mg discontinued on admission  CAD, peripheral vascular disease -Continue Plavix peri-Procedurally  -COPD-mild -Continue Symbicort 2 puffs twice a day 160/4.5 No wheeze therefore does not need rescue inhaler  Hyperlipidemia continue Lipitor 10 daily  DVT prophylaxis: Lovenox Code Status: Full Family Communication: 3 daughters at bedside Disposition Plan: unclear at +   Consultants:   Dr. Clementeen Graham  Dr Fletcher Anon  Procedures:   MRi  Antimicrobials:   Vancomycin  Zosyn    Subjective:  Doing fine. Tolerating diet. Multiple family members at bedside.   Objective: Filed Vitals:   03/26/16 0459 03/26/16 0927 03/26/16 0937 03/26/16 1353  BP: 106/46  105/48 99/40  Pulse: 63   74  Temp: 98.4 F (36.9 C)   98.4 F (36.9 C)  TempSrc:    Oral  Resp: 18   18  Height:      Weight:      SpO2: 96% 96%  93%    Intake/Output Summary (Last 24 hours) at 03/26/16 1841 Last data filed at 03/26/16 0241  Gross per 24 hour  Intake    350 ml  Output    225 ml  Net    125 ml   Filed Weights   03/23/16 2116 03/24/16 0137  Weight: 87.998 kg (194 lb) 91.536 kg (201 lb 12.8 oz)    Examination:  General exam: Appears calm and comfortable  Respiratory system: Clear to auscultation. Respiratory effort normal. Cardiovascular system: S1 & S2 heard, RRR. No JVD, murmurs, rubs, gallops or clicks. No pedal edema. Gastrointestinal system: Abdomen is  nondistended, soft and nontender. No organomegaly or masses felt. Normal bowel sounds heard. Central nervous system: Alert and oriented. No focal neurological deficits. Extremities: Symmetric 5 x 5 power. Skin:   Psychiatry: Judgement and insight appear normal. Mood & affect appropriate.     Data Reviewed: I have personally reviewed following labs and imaging studies  CBC:  Recent Labs Lab 03/23/16 2304  WBC 9.8  NEUTROABS  6.9  HGB 12.9*  HCT 40.1  MCV 98.0  PLT 123XX123   Basic Metabolic Panel:  Recent Labs Lab 03/23/16 2304 03/26/16 0622  NA 136 137  K 4.2 4.3  CL 97* 94*  CO2 28 32  GLUCOSE 109* 220*  BUN 38* 30*  CREATININE 2.20* 2.14*  CALCIUM 8.9 8.5*   GFR: Estimated Creatinine Clearance: 29.7 mL/min (by C-G formula based on Cr of 2.14). Liver Function Tests: No results for input(s): AST, ALT, ALKPHOS, BILITOT, PROT, ALBUMIN in the last 168 hours. No results for input(s): LIPASE, AMYLASE in the last 168 hours. No results for input(s): AMMONIA in the last 168 hours. Coagulation Profile: No results for input(s): INR, PROTIME in the last 168 hours. Cardiac Enzymes: No results for input(s): CKTOTAL, CKMB, CKMBINDEX, TROPONINI in the last 168 hours. BNP (last 3 results) No results for input(s): PROBNP in the last 8760 hours. HbA1C: No results for input(s): HGBA1C in the last 72 hours. CBG:  Recent Labs Lab 03/25/16 1731 03/25/16 2137 03/26/16 0800 03/26/16 1158 03/26/16 1709  GLUCAP 235* 288* 197* 281* 339*   Lipid Profile: No results for input(s): CHOL, HDL, LDLCALC, TRIG, CHOLHDL, LDLDIRECT in the last 72 hours. Thyroid Function Tests: No results for input(s): TSH, T4TOTAL, FREET4, T3FREE, THYROIDAB in the last 72 hours. Anemia Panel: No results for input(s): VITAMINB12, FOLATE, FERRITIN, TIBC, IRON, RETICCTPCT in the last 72 hours. Urine analysis:    Component Value Date/Time   COLORURINE YELLOW 01/18/2014 0719   APPEARANCEUR CLEAR 01/18/2014 0719   LABSPEC 1.020 01/18/2014 0719   PHURINE 6.0 01/18/2014 0719   GLUCOSEU >1000* 01/18/2014 0719   HGBUR NEGATIVE 01/18/2014 0719   HGBUR trace-intact 11/25/2007 0937   BILIRUBINUR NEGATIVE 01/18/2014 0719   KETONESUR NEGATIVE 01/18/2014 0719   PROTEINUR 30* 01/18/2014 0719   UROBILINOGEN 0.2 01/18/2014 0719   NITRITE NEGATIVE 01/18/2014 0719   LEUKOCYTESUR NEGATIVE 01/18/2014 0719   Recent Results (from the past 240  hour(s))  Wound culture     Status: None   Collection Time: 03/21/16  2:08 PM  Result Value Ref Range Status   Specimen Description WOUND  Final   Special Requests NONE  Final   Gram Stain   Final    FEW WBC PRESENT, PREDOMINANTLY PMN FEW GRAM POSITIVE COCCI RARE GRAM NEGATIVE RODS    Culture   Final    MODERATE GROWTH ESCHERICHIA COLI LIGHT GROWTH PSEUDOMONAS AERUGINOSA MODERATE GROWTH ENTEROCOCCUS FAECALIS    Report Status 03/24/2016 FINAL  Final   Organism ID, Bacteria ESCHERICHIA COLI  Final   Organism ID, Bacteria PSEUDOMONAS AERUGINOSA  Final   Organism ID, Bacteria ENTEROCOCCUS FAECALIS  Final      Susceptibility   Escherichia coli - MIC*    AMPICILLIN 4 SENSITIVE Sensitive     CEFAZOLIN <=4 SENSITIVE Sensitive     CEFEPIME <=1 SENSITIVE Sensitive     CEFTAZIDIME <=1 SENSITIVE Sensitive     CEFTRIAXONE <=1 SENSITIVE Sensitive     CIPROFLOXACIN <=0.25 SENSITIVE Sensitive     GENTAMICIN <=1 SENSITIVE Sensitive     IMIPENEM <=0.25 SENSITIVE  Sensitive     TRIMETH/SULFA <=20 SENSITIVE Sensitive     AMPICILLIN/SULBACTAM <=2 SENSITIVE Sensitive     PIP/TAZO <=4 SENSITIVE Sensitive     Extended ESBL NEGATIVE Sensitive     * MODERATE GROWTH ESCHERICHIA COLI   Pseudomonas aeruginosa - MIC*    CEFTAZIDIME 4 SENSITIVE Sensitive     CIPROFLOXACIN <=0.25 SENSITIVE Sensitive     GENTAMICIN <=1 SENSITIVE Sensitive     IMIPENEM 2 SENSITIVE Sensitive     CEFEPIME 2 SENSITIVE Sensitive     PIP/TAZO Value in next row Sensitive      SENSITIVE8    * LIGHT GROWTH PSEUDOMONAS AERUGINOSA   Enterococcus faecalis - MIC*    AMPICILLIN Value in next row Sensitive      SENSITIVE8    VANCOMYCIN Value in next row Sensitive      SENSITIVE8    GENTAMICIN SYNERGY Value in next row Sensitive      SENSITIVE8    LINEZOLID Value in next row Sensitive      SENSITIVE8    * MODERATE GROWTH ENTEROCOCCUS FAECALIS  Blood culture (routine x 2)     Status: None (Preliminary result)   Collection Time:  03/23/16  9:45 PM  Result Value Ref Range Status   Specimen Description BLOOD LEFT ANTECUBITAL  Final   Special Requests BOTTLES DRAWN AEROBIC ONLY 10CC  Final   Culture NO GROWTH 3 DAYS  Final   Report Status PENDING  Incomplete  Blood culture (routine x 2)     Status: None (Preliminary result)   Collection Time: 03/23/16  9:50 PM  Result Value Ref Range Status   Specimen Description BLOOD LEFT HAND  Final   Special Requests IN PEDIATRIC BOTTLE 3CC  Final   Culture NO GROWTH 3 DAYS  Final   Report Status PENDING  Incomplete     Radiology Studies: No results found. Scheduled Meds: . amLODipine  5 mg Oral Daily  . atorvastatin  10 mg Oral Daily  . ciprofloxacin  400 mg Intravenous Q24H  . clobetasol ointment  1 application Topical Daily  . clopidogrel  75 mg Oral Daily  . docusate sodium  100 mg Oral BID  . enoxaparin (LOVENOX) injection  30 mg Subcutaneous Q24H  . gabapentin  100 mg Oral TID  . insulin aspart  0-9 Units Subcutaneous TID WC  . insulin glargine  25 Units Subcutaneous Q24H  . mometasone-formoterol  2 puff Inhalation BID  . vancomycin  1,000 mg Intravenous Q24H   Continuous Infusions:    LOS: 2 days    Time spent: [place], MD [facility] ([facility]  If 7PM-7AM, please contact night-coverage [email or web address] Password [facility] 03/26/2016, 6:41 PM

## 2016-03-26 NOTE — Progress Notes (Signed)
Subjective: No acute overnight events. He has been having some pain around the wound but no other areas of his foot. He has continued on IV antibiotics. He has repeat ABI today. No fevers, chills, nausea, vomiting. No calf pain, chest pain, SOB. No new complaints  Objective: Blood pressure 99/40, pulse 74, temperature 98.4 F (36.9 C), temperature source Oral, resp. rate 18, height 6' 2.4" (1.89 m), weight 201 lb 12.8 oz (91.536 kg), SpO2 93 %.  AAO x3 NAD; multiple family members at bedside.  DP pulse non-palpable PT pulse decreased Sensation decreases Continuation of necrotic wound along medial 1st MTPJ with some serous drainage. There is an increase in malodor compared to yesterday. There is localized edema around the ulcer. Minimal erythema and no ascending cellulitis. The wound is the same size. No other open lesions on the left foot or right foot.  No pain with calf compression, swelling, warmth, erythema.   CBC    Component Value Date/Time   WBC 9.8 03/23/2016 2304   WBC 8.5 02/23/2016 0942   RBC 4.09* 03/23/2016 2304   RBC 4.32 02/23/2016 0942   HGB 12.9* 03/23/2016 2304   HCT 40.1 03/23/2016 2304   HCT 41.5 02/23/2016 0942   PLT 251 03/23/2016 2304   PLT 273 02/23/2016 0942   MCV 98.0 03/23/2016 2304   MCV 96 02/23/2016 0942   MCH 31.5 03/23/2016 2304   MCH 32.6 02/23/2016 0942   MCHC 32.2 03/23/2016 2304   MCHC 34.0 02/23/2016 0942   RDW 12.0 03/23/2016 2304   RDW 13.4 02/23/2016 0942   LYMPHSABS 1.2 03/23/2016 2304   MONOABS 1.4* 03/23/2016 2304   EOSABS 0.3 03/23/2016 2304   BASOSABS 0.0 03/23/2016 2304      BMP Latest Ref Rng 03/26/2016 03/23/2016 03/04/2016  Glucose 65 - 99 mg/dL 220(H) 109(H) 212(H)  BUN 6 - 20 mg/dL 30(H) 38(H) 23  Creatinine 0.61 - 1.24 mg/dL 2.14(H) 2.20(H) 1.48(H)  Sodium 135 - 145 mmol/L 137 136 139  Potassium 3.5 - 5.1 mmol/L 4.3 4.2 4.9  Chloride 101 - 111 mmol/L 94(L) 97(L) 98  CO2 22 - 32 mmol/L 32 28 32(H)  Calcium 8.9 - 10.3  mg/dL 8.5(L) 8.9 8.4(L)   ABI on the left 0.98 and on the right 0.99 from today. Compared to the studies done on 02/08/15 the ABI on the left was 0.71 and on the right 1.0. There has been improvement in the ABI compared to before the angioplasty.   Assessment: Necrotic ulcer left 1st MTPJ with osteomyelitis on MRI  Plan: -Treatment options discussed including all alternatives, risks, and complications -I had a long discussion with the patient and all the family members who were present today, including his wife who is at bedside who is POA. I spoke with Dr. Fletcher Anon on the phone in regards to the angioplasty and there has been improved flow compared to before the procedure. Also, discussed new arterial studies which have improved. There should be improved blood flow in order to help heal the surgery.  -I discussed surgical options with the family including limb salvage vs. Amputation. At this point due to multiple medical co-morbidities they have elected to proceed with amputation. They want to hopefully limit this to one surgery and they do not want the potential for long term antibiotics and possibly more surgery. I discussed this is still a risk and they understand. Discussed with primary physician who discussed BKA but I think we should limit the amputation to the 1st ray. Family understands  that he is at risk for further amputation, BKA and non-healing wound. After this long discussion they all agreed to a partial first ray amputation. He will likely not have enough skin to close the wound and will likely need a wound VAC. -The incision placement as well as the postoperative course was discussed with the patient. I discussed risks of the surgery which include, but not limited to, infection, bleeding, pain, swelling, need for further surgery, delayed or nonhealing, painful or ugly scar, numbness or sensation changes, over/under correction, recurrence, transfer lesions, further deformity, hardware failure,  DVT/PE, loss of other toes/foot/leg. Patient understands these risks and wishes to proceed with surgery.  -NPO after midnight.  -Continue IV abx for now -Continue care per primary team  Celesta Gentile, [place] C: [phone] O: [phone]

## 2016-03-26 NOTE — Progress Notes (Signed)
PT Cancellation Note  Patient Details Name: JO SELLARDS MRN: [id number] DOB: 1927-11-17   Cancelled Treatment:    Reason Eval/Treat Not Completed: Patient declined, no reason specified Pt reported "he has too much stuff on his mind right now". Pt encouraged to sit up in chair with assist from nursing staff. PT will continue to follow acutely.    [place] April, PTA Pager: [phone]   03/26/2016, 3:43 PM

## 2016-03-26 NOTE — Progress Notes (Signed)
Inpatient Diabetes Program Recommendations  AACE/ADA: New Consensus Statement on Inpatient Glycemic Control (2015)  Target Ranges:  Prepandial:   less than 140 mg/dL      Peak postprandial:   less than 180 mg/dL (1-2 hours)      Critically ill patients:  140 - 180 mg/dL   Results for JOHNY, MISSEL (MRN [id number]) as of 03/26/2016 08:39  Ref. Range 03/25/2016 06:00 03/25/2016 12:33 03/25/2016 17:31 03/25/2016 21:37 03/26/2016 08:00  Glucose-Capillary Latest Ref Range: 65-99 mg/dL 104 (H) 225 (H) 235 (H) 288 (H) 197 (H)   Review of Glycemic Control  Diabetes history: DM 2 Outpatient Diabetes medications: Glipizide 5 mg Daily, Lantus 25 units Daily Current orders for Inpatient glycemic control: Lantus 25 units, Novolog Sensitive TID  Inpatient Diabetes Program Recommendations: Insulin - Meal Coverage: Glucose increased into the 200's after meals yesterday. Please consider ordering meal coverage, Novolog 4 units TID in addition to correction scale. Give only if patient consumes at least 50% of meals.   Thanks,  Tama Headings RN, MSN, [facility] Inpatient Diabetes Coordinator Team Pager [phone] (8a-5p)'

## 2016-03-26 NOTE — Care Management Important Message (Signed)
Important Message  Patient Details  Name: [name] Small MRN: [id number] Date of Birth: 07-26-1928   Medicare Important Message Given:  Yes    Sharin Mons, RN 03/26/2016, 12:01 PM

## 2016-03-26 NOTE — Telephone Encounter (Signed)
Surgery was rescheduled at [facility] at 10:30 am on May 17.  I spoke to [place]

## 2016-03-27 ENCOUNTER — Inpatient Hospital Stay (HOSPITAL_COMMUNITY): Payer: Medicare Other | Admitting: Certified Registered Nurse Anesthetist

## 2016-03-27 ENCOUNTER — Encounter: Payer: Self-pay | Admitting: Podiatry

## 2016-03-27 ENCOUNTER — Inpatient Hospital Stay (HOSPITAL_COMMUNITY): Payer: Medicare Other

## 2016-03-27 ENCOUNTER — Telehealth: Payer: Self-pay | Admitting: *Deleted

## 2016-03-27 ENCOUNTER — Encounter (HOSPITAL_COMMUNITY)
Admission: EM | Disposition: A | Discharge status: Skilled Nursing Facility | Payer: Self-pay | Source: Home / Self Care | Attending: Family Medicine

## 2016-03-27 ENCOUNTER — Ambulatory Visit (HOSPITAL_COMMUNITY): Admission: RE | Admit: 2016-03-27 | Payer: Medicare Other | Source: Ambulatory Visit | Admitting: Podiatry

## 2016-03-27 DIAGNOSIS — E08621 Diabetes mellitus due to underlying condition with foot ulcer: Secondary | ICD-10-CM

## 2016-03-27 DIAGNOSIS — L97529 Non-pressure chronic ulcer of other part of left foot with unspecified severity: Secondary | ICD-10-CM

## 2016-03-27 DIAGNOSIS — M86679 Other chronic osteomyelitis, unspecified ankle and foot: Secondary | ICD-10-CM

## 2016-03-27 DIAGNOSIS — E11621 Type 2 diabetes mellitus with foot ulcer: Secondary | ICD-10-CM

## 2016-03-27 HISTORY — PX: AMPUTATION: SHX166

## 2016-03-27 LAB — CBC
HCT: 33.7 % — ABNORMAL LOW (ref 39.0–52.0)
Hemoglobin: 10.7 g/dL — ABNORMAL LOW (ref 13.0–17.0)
MCH: 31.2 pg (ref 26.0–34.0)
MCHC: 31.8 g/dL (ref 30.0–36.0)
MCV: 98.3 fL (ref 78.0–100.0)
Platelets: 340 10*3/uL (ref 150–400)
RBC: 3.43 MIL/uL — ABNORMAL LOW (ref 4.22–5.81)
RDW: 12.1 % (ref 11.5–15.5)
WBC: 9.9 10*3/uL (ref 4.0–10.5)

## 2016-03-27 LAB — BRAIN NATRIURETIC PEPTIDE: B Natriuretic Peptide: 125.3 pg/mL — ABNORMAL HIGH (ref 0.0–100.0)

## 2016-03-27 LAB — SURGICAL PCR SCREEN
MRSA, PCR: NEGATIVE
Staphylococcus aureus: NEGATIVE

## 2016-03-27 LAB — GLUCOSE, CAPILLARY
Glucose-Capillary: 355 mg/dL — ABNORMAL HIGH (ref 65–99)
Glucose-Capillary: 263 mg/dL — ABNORMAL HIGH (ref 65–99)
Glucose-Capillary: 214 mg/dL — ABNORMAL HIGH (ref 65–99)
Glucose-Capillary: 116 mg/dL — ABNORMAL HIGH (ref 65–99)
Glucose-Capillary: 117 mg/dL — ABNORMAL HIGH (ref 65–99)

## 2016-03-27 LAB — PROTIME-INR
INR: 1.08 (ref 0.00–1.49)
Prothrombin Time: 14.2 seconds (ref 11.6–15.2)

## 2016-03-27 SURGERY — AMPUTATION, FOOT, RAY
Anesthesia: Monitor Anesthesia Care | Site: Toe | Laterality: Left

## 2016-03-27 MED ORDER — 0.9 % SODIUM CHLORIDE (POUR BTL) OPTIME
TOPICAL | Status: DC | PRN
  Administered 2016-03-27: 1000 mL

## 2016-03-27 MED ORDER — BUPIVACAINE HCL (PF) 0.5 % IJ SOLN
INTRAMUSCULAR | Status: DC | PRN
  Administered 2016-03-27: 30 mL via INTRA_ARTICULAR

## 2016-03-27 MED ORDER — PROPOFOL 500 MG/50ML IV EMUL
INTRAVENOUS | Status: DC | PRN
  Administered 2016-03-27: 25 ug/kg/min via INTRAVENOUS

## 2016-03-27 MED ORDER — MEPERIDINE HCL 25 MG/ML IJ SOLN: 6.2500 mg | INTRAMUSCULAR | Status: DC | PRN

## 2016-03-27 MED ORDER — LIDOCAINE HCL 2 % IJ SOLN
INTRAMUSCULAR | Status: AC
  Filled 2016-03-27: qty 20

## 2016-03-27 MED ORDER — FENTANYL CITRATE (PF) 250 MCG/5ML IJ SOLN
INTRAMUSCULAR | Status: DC | PRN
  Administered 2016-03-27: 25 ug via INTRAVENOUS

## 2016-03-27 MED ORDER — LACTATED RINGERS IV SOLN: INTRAVENOUS | Status: DC

## 2016-03-27 MED ORDER — PROPOFOL 10 MG/ML IV BOLUS
INTRAVENOUS | Status: DC | PRN
  Administered 2016-03-27: 20 mg via INTRAVENOUS

## 2016-03-27 MED ORDER — LACTATED RINGERS IV SOLN
INTRAVENOUS | Status: DC
  Administered 2016-03-27 – 2016-03-28 (×2): via INTRAVENOUS

## 2016-03-27 MED ORDER — PROPOFOL 1000 MG/100ML IV EMUL
INTRAVENOUS | Status: AC
  Filled 2016-03-27: qty 100

## 2016-03-27 MED ORDER — GLYCOPYRROLATE 0.2 MG/ML IJ SOLN
INTRAMUSCULAR | Status: DC | PRN
  Administered 2016-03-27 (×2): 0.1 mg via INTRAVENOUS

## 2016-03-27 MED ORDER — IPRATROPIUM-ALBUTEROL 0.5-2.5 (3) MG/3ML IN SOLN: 3.0000 mL | Freq: Four times a day (QID) | RESPIRATORY_TRACT | Status: DC | PRN

## 2016-03-27 MED ORDER — LIDOCAINE HCL 2 % IJ SOLN
INTRAMUSCULAR | Status: DC | PRN
  Administered 2016-03-27: 30 mL

## 2016-03-27 MED ORDER — LIDOCAINE HCL (CARDIAC) 20 MG/ML IV SOLN
INTRAVENOUS | Status: DC | PRN
  Administered 2016-03-27: 40 mg via INTRATRACHEAL

## 2016-03-27 MED ORDER — ONDANSETRON HCL 4 MG/2ML IJ SOLN
INTRAMUSCULAR | Status: DC | PRN
  Administered 2016-03-27: 4 mg via INTRAVENOUS

## 2016-03-27 MED ORDER — FENTANYL CITRATE (PF) 100 MCG/2ML IJ SOLN: 25.0000 ug | INTRAMUSCULAR | Status: DC | PRN

## 2016-03-27 MED ORDER — BUPIVACAINE HCL (PF) 0.5 % IJ SOLN
INTRAMUSCULAR | Status: AC
  Filled 2016-03-27: qty 30

## 2016-03-27 SURGICAL SUPPLY — 52 items
BANDAGE ACE 3X5.8 VEL STRL LF (GAUZE/BANDAGES/DRESSINGS) ×1 IMPLANT
BANDAGE ACE 4X5 VEL STRL LF (GAUZE/BANDAGES/DRESSINGS) ×1 IMPLANT
BLADE LONG MED 31X9 (MISCELLANEOUS) ×1 IMPLANT
BNDG CMPR 9X4 STRL LF SNTH (GAUZE/BANDAGES/DRESSINGS) ×1
BNDG COHESIVE 4X5 TAN STRL (GAUZE/BANDAGES/DRESSINGS) ×1 IMPLANT
BNDG CONFORM 2 STRL LF (GAUZE/BANDAGES/DRESSINGS) ×1 IMPLANT
BNDG ESMARK 4X9 LF (GAUZE/BANDAGES/DRESSINGS) ×2 IMPLANT
BNDG GAUZE ELAST 4 BULKY (GAUZE/BANDAGES/DRESSINGS) ×2 IMPLANT
CONT SPEC 4OZ CLIKSEAL STRL BL (MISCELLANEOUS) ×1 IMPLANT
CUFF TOURNIQUET SINGLE 18IN (TOURNIQUET CUFF) ×1 IMPLANT
DRAIN CHANNEL 19F RND (DRAIN) ×1 IMPLANT
DRAIN SNY WOU TUB LG (WOUND CARE) ×1 IMPLANT
DRSG EMULSION OIL 3X3 NADH (GAUZE/BANDAGES/DRESSINGS) ×2 IMPLANT
DRSG PAD ABDOMINAL 8X10 ST (GAUZE/BANDAGES/DRESSINGS) ×1 IMPLANT
DURAPREP 26ML APPLICATOR (WOUND CARE) ×2 IMPLANT
ELECT REM PT RETURN 9FT ADLT (ELECTROSURGICAL) ×2
ELECTRODE REM PT RTRN 9FT ADLT (ELECTROSURGICAL) ×1 IMPLANT
EVACUATOR SILICONE 100CC (DRAIN) ×1 IMPLANT
GAUZE SPONGE 4X4 12PLY STRL (GAUZE/BANDAGES/DRESSINGS) ×1 IMPLANT
GLOVE BIO SURGEON STRL SZ7.5 (GLOVE) ×4 IMPLANT
GLOVE BIO SURGEON STRL SZ8.5 (GLOVE) ×1 IMPLANT
GLOVE BIOGEL PI IND STRL 7.5 (GLOVE) ×1 IMPLANT
GLOVE BIOGEL PI IND STRL 8.5 (GLOVE) IMPLANT
GLOVE BIOGEL PI INDICATOR 7.5 (GLOVE) ×1
GLOVE BIOGEL PI INDICATOR 8.5 (GLOVE) ×1
GOWN STRL REUS W/ TWL LRG LVL3 (GOWN DISPOSABLE) ×1 IMPLANT
GOWN STRL REUS W/ TWL XL LVL3 (GOWN DISPOSABLE) ×1 IMPLANT
GOWN STRL REUS W/TWL LRG LVL3 (GOWN DISPOSABLE)
GOWN STRL REUS W/TWL XL LVL3 (GOWN DISPOSABLE) ×4
HYDROGEN PEROXIDE 16OZ (MISCELLANEOUS) ×1 IMPLANT
KIT BASIN OR (CUSTOM PROCEDURE TRAY) ×2 IMPLANT
NDL HYPO 25X1 1.5 SAFETY (NEEDLE) ×1 IMPLANT
NDL SAFETY ECLIPSE 18X1.5 (NEEDLE) IMPLANT
NEEDLE HYPO 18GX1.5 SHARP (NEEDLE)
NEEDLE HYPO 25X1 1.5 SAFETY (NEEDLE) ×4 IMPLANT
NS IRRIG 1000ML POUR BTL (IV SOLUTION) ×1 IMPLANT
PACK ORTHO EXTREMITY (CUSTOM PROCEDURE TRAY) ×1 IMPLANT
PADDING CAST ABS 4INX4YD NS (CAST SUPPLIES)
PADDING CAST ABS COTTON 4X4 ST (CAST SUPPLIES) ×1 IMPLANT
SPONGE GAUZE 4X4 12PLY STER LF (GAUZE/BANDAGES/DRESSINGS) ×1 IMPLANT
SUT ETHILON 2 0 FS 18 (SUTURE) ×1 IMPLANT
SUT MNCRL AB 3-0 PS2 18 (SUTURE) ×1 IMPLANT
SUT MNCRL AB 4-0 PS2 18 (SUTURE) IMPLANT
SUT MON AB 5-0 PS2 18 (SUTURE) ×2 IMPLANT
SUT PROLENE 3 0 PS 1 (SUTURE) ×4 IMPLANT
SUT PROLENE 4 0 PS 2 18 (SUTURE) ×2 IMPLANT
SWAB COLLECTION DEVICE MRSA (MISCELLANEOUS) ×2 IMPLANT
SWAB CULTURE ESWAB REG 1ML (MISCELLANEOUS) ×1 IMPLANT
SYRINGE 10CC LL (SYRINGE) ×2 IMPLANT
TUBE CONNECTING 12X1/4 (SUCTIONS) ×1 IMPLANT
UNDERPAD 30X30 (UNDERPADS AND DIAPERS) ×1 IMPLANT
YANKAUER SUCT BULB TIP NO VENT (SUCTIONS) ×1 IMPLANT

## 2016-03-27 NOTE — Progress Notes (Signed)
S/p left partial 1st ray amputation Pathology as well as clean margin micro and path sent NWB left foot with CAM boot; recommend PT consult D/C NPO Resume meds per primary team See op report for details Will continue to follow  Celesta Gentile, [place] [phone]

## 2016-03-27 NOTE — Progress Notes (Signed)
[facility]  PROGRESS NOTE  [name] Small [id number] DOB: Apr 27, 1928 DOA: 03/23/2016 PCP: [name], MD    Brief HPI:  82 y/o male  type II DM CKD III  CAD -stress test for DOE, it showed mostly fixed inferior wall defect with minimal periinfarct ischemia. He was started on pravastatin that he couldn't tolerate and Plavix as he is allergic to aspirin -R + L Heart cath 04/2011 ,Non-ob CAD CHF Afib Bladder Ca s/p Chemo PVD -noninvasive vascular evaluation in 01/2016 showed an ABI of 1.0 on the right and 0.71 on the left -admission 4/19-->02/29/16 critical LE ischemia, L metatarsal head wound. He underwent an angioplasty at that time which showed good blood flow   Patient admitted 03/24/2016 with losing and malodorous wound which was initially treated with Cipro and then Augmentin and he finally came to theED.  Active Problems:   Diabetic ulcer of left foot ([place])   Diabetic ulcer of left foot associated with diabetes mellitus due to underlying condition ([place])   Assessment/Plan: 1. Status post left partial first ray amputation- patient presented with diabetic foot ulcer, osteomyelitis, was was started on vancomycin and ciprofloxacin. Was seen by podiatry and underwent left partial first ray amputation today. Pain is well controlled. 2. History of COPD- patient is currently on oxygen, O2 sats 97% on 2 L nasal cannula. Has mild wheezing. Continue Dulera, also start DuoNeb nebulizers every 6 hours when necessary for shortness of breath. 3. History of diastolic heart failure- patient has history of grade 1 diastolic dysfunction, has been on Lasix 40 mg every other day at home. Lasix has been on hold in the hospital, Will check BNP, and restart Lasix if BNP elevated. 4. Diabetes mellitus- continue Lantus 25 units subcutaneous daily, sliding scale insulin with NovoLog. 5. Hypertension- continue Norvasc, blood pressure is controlled. 6. CAD- continue Plavix 7. Chronic kidney  disease stage III- patient's Kristen creatinine 1.48, he presented with creatinine of 2.20. Lasix currently on hold. Follow BMP in a.m.   DVT prophylaxis: Lovenox Code Status: Full code Family Communication: Discussed with family members at bedside Disposition Plan: Awaiting PT eval   Consultants:  Dr. Clementeen Graham  Dr Fletcher Anon  Procedures:  S/p left partial 1st ray amputation  Antibiotics:  Vancomycin  Ciprofloxacin  Subjective: Patient seen and examined, status post surgery. Lying comfortably in bed. Denies any symptoms. No chest pain or shortness of breath.  Objective: Filed Vitals:   03/27/16 1149 03/27/16 1200 03/27/16 1221 03/27/16 1445  BP: 119/60 122/56 123/50 129/58  Pulse: 73 69 68 70  Temp: 97.6 F (36.4 C)   97.5 F (36.4 C)  TempSrc:    Oral  Resp: 14 15 18 18   Height:      Weight:      SpO2: 93% 97% 96% 97%    Intake/Output Summary (Last 24 hours) at 03/27/16 2011 Last data filed at 03/27/16 1330  Gross per 24 hour  Intake    620 ml  Output    750 ml  Net   -130 ml   Filed Weights   03/23/16 2116 03/24/16 0137  Weight: 87.998 kg (194 lb) 91.536 kg (201 lb 12.8 oz)    Examination:  General exam: Appears calm and comfortable  Respiratory system: Scattered wheezing bilaterally, Respiratory effort normal. Cardiovascular system: S1 & S2 heard, RRR. No JVD, murmurs, rubs, gallops or clicks. No pedal edema. Central nervous system: Alert and oriented. No focal neurological deficits. Extremities: . Left foot in dressing Psychiatry: Judgement and insight  appear normal. Mood & affect appropriate.    Data Reviewed: I have personally reviewed following labs and imaging studies Basic Metabolic Panel:  Recent Labs Lab 03/23/16 2304 03/26/16 0622  NA 136 137  K 4.2 4.3  CL 97* 94*  CO2 28 32  GLUCOSE 109* 220*  BUN 38* 30*  CREATININE 2.20* 2.14*  CALCIUM 8.9 8.5*   CBC:  Recent Labs Lab 03/23/16 2304 03/27/16 0420  WBC 9.8 9.9  NEUTROABS  6.9  --   HGB 12.9* 10.7*  HCT 40.1 33.7*  MCV 98.0 98.3  PLT 251 340    CBG:  Recent Labs Lab 03/26/16 2200 03/27/16 0802 03/27/16 1011 03/27/16 1154 03/27/16 1652  GLUCAP 237* 355* 263* 214* 116*    Recent Results (from the past 240 hour(s))  Wound culture     Status: None   Collection Time: 03/21/16  2:08 PM  Result Value Ref Range Status   Specimen Description WOUND  Final   Special Requests NONE  Final   Gram Stain   Final    FEW WBC PRESENT, PREDOMINANTLY PMN FEW GRAM POSITIVE COCCI RARE GRAM NEGATIVE RODS    Culture   Final    MODERATE GROWTH ESCHERICHIA COLI LIGHT GROWTH PSEUDOMONAS AERUGINOSA MODERATE GROWTH ENTEROCOCCUS FAECALIS    Report Status 03/24/2016 FINAL  Final   Organism ID, Bacteria ESCHERICHIA COLI  Final   Organism ID, Bacteria PSEUDOMONAS AERUGINOSA  Final   Organism ID, Bacteria ENTEROCOCCUS FAECALIS  Final      Susceptibility   Escherichia coli - MIC*    AMPICILLIN 4 SENSITIVE Sensitive     CEFAZOLIN <=4 SENSITIVE Sensitive     CEFEPIME <=1 SENSITIVE Sensitive     CEFTAZIDIME <=1 SENSITIVE Sensitive     CEFTRIAXONE <=1 SENSITIVE Sensitive     CIPROFLOXACIN <=0.25 SENSITIVE Sensitive     GENTAMICIN <=1 SENSITIVE Sensitive     IMIPENEM <=0.25 SENSITIVE Sensitive     TRIMETH/SULFA <=20 SENSITIVE Sensitive     AMPICILLIN/SULBACTAM <=2 SENSITIVE Sensitive     PIP/TAZO <=4 SENSITIVE Sensitive     Extended ESBL NEGATIVE Sensitive     * MODERATE GROWTH ESCHERICHIA COLI   Pseudomonas aeruginosa - MIC*    CEFTAZIDIME 4 SENSITIVE Sensitive     CIPROFLOXACIN <=0.25 SENSITIVE Sensitive     GENTAMICIN <=1 SENSITIVE Sensitive     IMIPENEM 2 SENSITIVE Sensitive     CEFEPIME 2 SENSITIVE Sensitive     PIP/TAZO Value in next row Sensitive      SENSITIVE8    * LIGHT GROWTH PSEUDOMONAS AERUGINOSA   Enterococcus faecalis - MIC*    AMPICILLIN Value in next row Sensitive      SENSITIVE8    VANCOMYCIN Value in next row Sensitive      SENSITIVE8     GENTAMICIN SYNERGY Value in next row Sensitive      SENSITIVE8    LINEZOLID Value in next row Sensitive      SENSITIVE8    * MODERATE GROWTH ENTEROCOCCUS FAECALIS  Blood culture (routine x 2)     Status: None (Preliminary result)   Collection Time: 03/23/16  9:45 PM  Result Value Ref Range Status   Specimen Description BLOOD LEFT ANTECUBITAL  Final   Special Requests BOTTLES DRAWN AEROBIC ONLY 10CC  Final   Culture NO GROWTH 4 DAYS  Final   Report Status PENDING  Incomplete  Blood culture (routine x 2)     Status: None (Preliminary result)   Collection Time: 03/23/16  9:50  PM  Result Value Ref Range Status   Specimen Description BLOOD LEFT HAND  Final   Special Requests IN PEDIATRIC BOTTLE 3CC  Final   Culture NO GROWTH 4 DAYS  Final   Report Status PENDING  Incomplete  Surgical pcr screen     Status: None   Collection Time: 03/26/16 10:12 PM  Result Value Ref Range Status   MRSA, PCR NEGATIVE NEGATIVE Final   Staphylococcus aureus NEGATIVE NEGATIVE Final    Comment:        The Xpert SA Assay (FDA approved for NASAL specimens in patients over 11 years of age), is one component of a comprehensive surveillance program.  Test performance has been validated by [facility] for patients greater than or equal to 21 year old. It is not intended to diagnose infection nor to guide or monitor treatment.      Studies: Dg Foot 2 Views Left  03/27/2016  CLINICAL DATA:  Post partial amputation left foot EXAM: LEFT FOOT - 2 VIEW COMPARISON:  03/23/2016 FINDINGS: Two views of the left foot submitted. The patient is status post amputation of mid and distal aspect first metatarsal and first toe. A surgical drain is noted in the region of first metatarsal. The base of first metatarsal remnant is unremarkable. The alignment is preserved IMPRESSION: Status post amputation of mid and distal aspect of first metatarsal and left great toe. Postsurgical changes with drain in place. Electronically  Signed   By: Lahoma Crocker M.D.   On: 03/27/2016 13:09    Scheduled Meds: . amLODipine  2.5 mg Oral Daily  . atorvastatin  10 mg Oral Daily  . ciprofloxacin  400 mg Intravenous Q24H  . clobetasol ointment  1 application Topical Daily  . clopidogrel  75 mg Oral Daily  . docusate sodium  100 mg Oral BID  . enoxaparin (LOVENOX) injection  30 mg Subcutaneous Q24H  . gabapentin  100 mg Oral TID  . insulin aspart  0-9 Units Subcutaneous TID WC  . insulin glargine  25 Units Subcutaneous Q24H  . mometasone-formoterol  2 puff Inhalation BID  . vancomycin  1,000 mg Intravenous Q24H   Continuous Infusions: . lactated ringers 50 mL/hr at 03/27/16 1017       Time spent: 25 min    [facility] Pager [phone]. If 7PM-7AM, please contact night-coverage at [email or web address], Office  [phone]  password [email or web address] 03/27/2016, 8:11 PM  LOS: 3 days

## 2016-03-27 NOTE — Progress Notes (Signed)
Orthopedic Tech Progress Note Patient Details:  [name] Small [birth year] [id number]  Ortho Devices Type of Ortho Device: CAM walker Ortho Device/Splint Location: lle Ortho Device/Splint Interventions: Application   [name] 03/27/2016, 1:15 PM

## 2016-03-27 NOTE — Telephone Encounter (Signed)
He is in the hospital as an inpatient. I have discontinued the NPO (which was done before I left the hospital). My cell phone number is in the chart on all progress notes from me. The nurse can call with any questions.

## 2016-03-27 NOTE — Progress Notes (Signed)
Pharmacy Antibiotic Note  [name] Small is a 82 y.o. male admitted on 03/23/2016 with worsening left foot wound MRI concerning for osteo. S/p wound debridement and partial first ray amputation on 5/17. Currently on IV cipro and vancomycin. AoKD, Scr 2.2 > 2.14, est. Crcl ~ 30 ml/min, stable.  Vancomycin 5/14>>  Zosyn 5/14>> 5/16  Cipro 5/16 >>   5/13 BC x 2 -  5/11 wound - e.coli, PSA, enterococcus faecalis (all pan sens)  5/17 tissue cx -   Plan: Cipro 400 mg IV Q 24  Vancomycin 1 g IV Q 24  F/u cultures  BMET in AM  Check trough tomorrow or Friday if continues  Needs to increase lantus dose?   Height: 6' 2.4" (189 cm) Weight: 201 lb 12.8 oz (91.536 kg) IBW/kg (Calculated) : 83.12  Temp (24hrs), Avg:98.3 F (36.8 C), Min:97.6 F (36.4 C), Max:98.8 F (37.1 C)   Recent Labs Lab 03/23/16 2304 03/23/16 2311 03/26/16 0622 03/27/16 0420  WBC 9.8  --   --  9.9  CREATININE 2.20*  --  2.14*  --   LATICACIDVEN  --  0.79  --   --     Estimated Creatinine Clearance: 29.7 mL/min (by C-G formula based on Cr of 2.14).    Allergies  Allergen Reactions  . Actos [Pioglitazone Hydrochloride] Other (See Comments)    Held 2012 bladder cancer  . Aspirin Other (See Comments)    Held 2012 due to hematuria and bruising.   . Ace Inhibitors Cough  . Gabapentin     Upset stomach and diarrhea - tolerates low doses  . Lyrica [Pregabalin] Other (See Comments)    swelling  . Pravastatin Other (See Comments)    Swelling of feet  . Bactrim Rash and Other (See Comments)    Rash, presumed allergy  . Sulfa Drugs Cross Reactors Rash    Maryanna Shape, PharmD, BCPS  Clinical Pharmacist  Pager: [phone]   03/27/2016 1:16 PM

## 2016-03-27 NOTE — Progress Notes (Addendum)
PT Cancellation Note  Patient Details Name: [name] Small MRN: [id number] DOB: 07/17/1928   Cancelled Treatment:    Reason Eval/Treat Not Completed: Other (comment). Pt with surgery earlier and now sleepy and tired post procedure. Will follow up tomorrow.   Joselyn Edling 03/27/2016, 2:30 PM [employer] PT [phone]

## 2016-03-27 NOTE — Transfer of Care (Signed)
Immediate Anesthesia Transfer of Care Note  Patient: [name] Small  Procedure(s) Performed: Procedure(s): partial first AMPUTATION RAY; LEFT (Left)  Patient Location: PACU  Anesthesia Type:MAC  Level of Consciousness: awake, alert  and oriented  Airway & Oxygen Therapy: Patient Spontanous Breathing and Patient connected to nasal cannula oxygen  Post-op Assessment: Report given to RN, Post -op Vital signs reviewed and stable and Patient moving all extremities  Post vital signs: Reviewed and stable  Last Vitals:  Filed Vitals:   03/27/16 0536 03/27/16 1149  BP: 122/57 119/60  Pulse: 71 73  Temp: 36.8 C 36.4 C  Resp: 16 14    Last Pain:  Filed Vitals:   03/27/16 1151  PainSc: 0-No pain         Complications: No apparent anesthesia complications

## 2016-03-27 NOTE — Progress Notes (Signed)
   03/27/16 1056  PT Visit Information  Last PT Received On 03/27/16  Reason Eval/Treat Not Completed Patient at procedure or test/unavailable; PT will check on pt later as time allows.   History of Present Illness Admitted with diabetic L foot ulcer. Hx of DMII, CKD, CAD, PVD, Afib, CHF. From home with wife. PTA (sickness), pt independent with ADL's. Owns cane and uses home [place], PTA Pager: [phone]

## 2016-03-27 NOTE — Anesthesia Postprocedure Evaluation (Signed)
Anesthesia Post Note  Patient: [name] Small  Procedure(s) Performed: Procedure(s) (LRB): partial first AMPUTATION RAY; LEFT (Left)  Patient location during evaluation: PACU Anesthesia Type: MAC Level of consciousness: awake and alert Pain management: pain level controlled Vital Signs Assessment: post-procedure vital signs reviewed and stable Respiratory status: spontaneous breathing, nonlabored ventilation, respiratory function stable and patient connected to nasal cannula oxygen Cardiovascular status: stable and blood pressure returned to baseline Anesthetic complications: no    Last Vitals:  Filed Vitals:   03/27/16 1200 03/27/16 1221  BP: 122/56 123/50  Pulse: 69 68  Temp:    Resp: 15 18    Last Pain:  Filed Vitals:   03/27/16 1235  PainSc: 0-No pain                 Effie Berkshire

## 2016-03-27 NOTE — Anesthesia Preprocedure Evaluation (Addendum)
Anesthesia Evaluation  Patient identified by MRN, date of birth, ID band Patient awake    Reviewed: Allergy & Precautions, NPO status , Patient's Chart, lab work & pertinent test results  Airway Mallampati: II  TM Distance: >3 FB Neck ROM: Full    Dental  (+) Upper Dentures, Lower Dentures, Dental Advisory Given   Pulmonary shortness of breath and Long-Term Oxygen Therapy, former smoker,    breath sounds clear to auscultation       Cardiovascular hypertension, Pt. on medications and Pt. on home beta blockers + CAD, + Peripheral Vascular Disease, +CHF and + DOE   Rhythm:Regular Rate:Normal     Neuro/Psych PSYCHIATRIC DISORDERS negative neurological ROS     GI/Hepatic negative GI ROS, Neg liver ROS,   Endo/Other  diabetes, Type 2, Insulin Dependent, Oral Hypoglycemic Agents  Renal/GU CRFRenal disease  negative genitourinary   Musculoskeletal   Abdominal   Peds negative pediatric ROS (+)  Hematology negative hematology ROS (+)   Anesthesia Other Findings - Home O2 2.5L  Reproductive/Obstetrics negative OB ROS                            Lab Results  Component Value Date   WBC 9.9 03/27/2016   HGB 10.7* 03/27/2016   HCT 33.7* 03/27/2016   MCV 98.3 03/27/2016   PLT 340 03/27/2016   Lab Results  Component Value Date   CREATININE 2.14* 03/26/2016   BUN 30* 03/26/2016   NA 137 03/26/2016   K 4.3 03/26/2016   CL 94* 03/26/2016   CO2 32 03/26/2016   Lab Results  Component Value Date   INR 1.08 03/27/2016   INR 1.07 03/04/2016   INR 1.0 02/23/2016   02/2016 EKG: normal sinus rhythm.    Anesthesia Physical Anesthesia Plan  ASA: III  Anesthesia Plan: MAC   Post-op Pain Management:    Induction: Intravenous  Airway Management Planned: Simple Face Mask  Additional Equipment:   Intra-op Plan:   Post-operative Plan:   Informed Consent: I have reviewed the patients History  and Physical, chart, labs and discussed the procedure including the risks, benefits and alternatives for the proposed anesthesia with the patient or authorized representative who has indicated his/her understanding and acceptance.   Dental advisory given  Plan Discussed with: CRNA  Anesthesia Plan Comments:        Anesthesia Quick Evaluation

## 2016-03-27 NOTE — Op Note (Signed)
Surgeon: Celesta Gentile, DPM Assistants: none Pre-operative diagnosis: left 1st metatarsal osteomyelitis; nonhealing necrotic ulcer  Post-operative diagnosis: same Procedure: Left partial 1st ray amputation  Pathology: Specimen-  Left 1st ray pathology, clean margin 1st metatarsal pathology/micorbiology Anesthesia: MAC local with 10 cc 1:1 mix 2% lidocaine plain and 0.5% Marcaine plain Hemostasis: Anatomic  EBL: per anesthesia; < 50cc Materials: 3-0 prolene Injectables: 10 cc 1:1 mix 0.5% Marcaine plain and 2% lidocaine plain Complications: None  Indications for surgery: Patient originally presented to the office for a necrotic wound left foot medial first metatarsal. He was later admitted for the wound. MRI was obtained which did reveal a cementless first metatarsal head. Repeat ABIs did reveal increased arterial flow status post angioplasty of the left leg. I get the patient treatment options including limb salvage and amputation. He does not want to undergo the potential for long-term IV and orthotics and a second surgery To go ahead and proceed with amputation to help minimize these risks. He understands there are still risks but wishes to proceed with amputation. This was also discussed with his wife as well as multiple family members. All alternatives, risks, complications were discussed the patient/family in detail. No promises or guarantees were given to the outcome of the surgery and all questions were answered to the best of my ability.  Procedure in Detail: The patient's preferably individually identified by myself and nursing staff and anesthesia staff in the preoperative holding area was then transferred to the operating room via stretcher and placed on the operative table in a supine position. A well-padded tourniquet was applied however this was not inflated during the entire procedure and was placed if needed. The left lower extremity was then scrubbed prepped and draped after  total of 10 mL of a one-to-one mixture of 2% lidocaine plain and 0.5% Marcaine plain was infiltrated in a regional block fashion.  Attention was then directed overlying the first ray. A racquet-shaped incision was planned along the first metatarsal phalangeal joint encompassing the necrotic wound. The wound did have some serosanguineous drainage and there was erythema and warmth of the toe however there is no erythema on the proximal metatarsal there is no ascending cellulitis. A 10 blade scalpel from skin to bone. Dissection was carried around the first metatarsal phalangeal joint removing the toe as well as necrotic wound. The medial aspect of the first metatarsal head did appear to be darkened in color and soft.The first metatarsal and then cut with a bone saw in a manner to help prevent pressure. The remaining first metatarsal did appear to be hard and white in color. Soft tissues debrided of all nonviable tissue. At this time there is no purulence expressed and the remaining skin appears to be healthy without signs of infection. There was also found to be perfusion during the procedure and hemostasis was achieved. The incision was copiously irrigated with sterile saline. A small piece of bone was removed from the remaining first metatarsal and sent for both culture and pathology. At this time again the incision was copiously irrigated and hemostasis was achieved. No further signs of necrotic or infected tissue. The skin was then reapproximated with Prolene without tension. A Hemovac drain was placed in place to suction. The incision closed well without any tension. Adaptic was placed over the incision followed by dry sterile dressing. At the conclusion of procedure the capillary refill time remained intact to the remaining digits. He was unable from anesthesia and found to tolerate the procedure well any  complications. No stranger to PACU last unstable and vascular status intact. During the procedure total 10  mL was also infiltrated to ensure anesthesia of a mixture of 2% lidocaine plain and 0.5% Marcaine plain.  Findings were discussed with family.  He is an inpatient. Remain on IV antibiotics. Await cultures. Will need to be nonweightbearing on the left foot and recommended physical therapy consult. Will also need CAM boot.   Procedure in detail:

## 2016-03-27 NOTE — Brief Op Note (Signed)
03/23/2016 - 03/27/2016  11:54 AM  PATIENT:  [name] Small  82 y.o. male  PRE-OPERATIVE DIAGNOSIS:  OSTEOMYELITIS  POST-OPERATIVE DIAGNOSIS:  OSTEOMYELITIS  PROCEDURE:  Procedure(s): partial first AMPUTATION RAY; LEFT (Left)  SURGEON:  Surgeon(s) and Role:    * Trula Slade, DPM - Primary  PHYSICIAN ASSISTANT:   ASSISTANTS: None   ANESTHESIA:   MAC  EBL:  Total I/O In: 400 [I.V.:400] Out: 50 [Blood:50]  BLOOD ADMINISTERED:none  DRAINS: (1) Hemovact drain(s) in the left foot with  Suction Open   LOCAL MEDICATIONS USED:  OTHER 20 cc total of 2% Lidocaine plain and .5% Marcaine plain  SPECIMEN:  Source of Specimen:  Left clean margin micro/path; left foot big toe pathology  DISPOSITION OF SPECIMEN:  PATHOLOGY  COUNTS:  YES  TOURNIQUET:  * No tourniquets in log *  DICTATION: .Dragon Dictation  PLAN OF CARE: Inpatient  PATIENT DISPOSITION:  PACU - hemodynamically stable.   Delay start of Pharmacological VTE agent (>24hrs) due to surgical blood loss or risk of bleeding: no

## 2016-03-27 NOTE — Telephone Encounter (Addendum)
Pt's dtr, [place] is requesting a direct pager number for you.  When can pt eat?  I spoke with Arrie Aran [phone] and gave her the information supplied by Dr. Jacqualyn Posey.  Dawn states pt is doing well, and not other concerns at this time. 03/28/2016-Post op courtesy call-Dawn states pt is doing great, physical therapist has him up on the good leg to get in the reclining chair.  Dawn states the case worker is looking to get pt into the [facility] Home and they don't know if he'll be released to get the drain out of his foot by Wednesday.  Dawn and pt's family thank Dr. Jacqualyn Posey for all of his help.  04/01/2016-Dr. Jacqualyn Posey ordered drain removed from left foot today if not able to remove at [facility], then have pt come in today or tomorrow with Dr. Cannon Kettle and cancel 04/05/2016 appt.  I spoke with Bonnielee Haff at [facility] and she said she could not take out the drain there. Ms. Bobby Rumpf and I scheduled pt to be seen 04/02/2016 at 315pm with Dr. Cannon Kettle and cancelled 04/05/2016 appt.

## 2016-03-28 ENCOUNTER — Ambulatory Visit: Payer: Medicare Other | Admitting: Surgery

## 2016-03-28 ENCOUNTER — Encounter (HOSPITAL_COMMUNITY): Payer: Self-pay | Admitting: Podiatry

## 2016-03-28 LAB — VANCOMYCIN, TROUGH: Vancomycin Tr: 16 ug/mL (ref 10.0–20.0)

## 2016-03-28 LAB — GLUCOSE, CAPILLARY
Glucose-Capillary: 87 mg/dL (ref 65–99)
Glucose-Capillary: 195 mg/dL — ABNORMAL HIGH (ref 65–99)
Glucose-Capillary: 207 mg/dL — ABNORMAL HIGH (ref 65–99)
Glucose-Capillary: 214 mg/dL — ABNORMAL HIGH (ref 65–99)

## 2016-03-28 LAB — CULTURE, BLOOD (ROUTINE X 2)
Culture: NO GROWTH
Culture: NO GROWTH

## 2016-03-28 LAB — BASIC METABOLIC PANEL
Anion gap: 8 (ref 5–15)
BUN: 23 mg/dL — ABNORMAL HIGH (ref 6–20)
CO2: 34 mmol/L — ABNORMAL HIGH (ref 22–32)
Calcium: 8.3 mg/dL — ABNORMAL LOW (ref 8.9–10.3)
Chloride: 97 mmol/L — ABNORMAL LOW (ref 101–111)
Creatinine, Ser: 1.84 mg/dL — ABNORMAL HIGH (ref 0.61–1.24)
GFR calc Af Amer: 37 mL/min — ABNORMAL LOW (ref >60)
GFR calc non Af Amer: 32 mL/min — ABNORMAL LOW (ref >60)
Glucose, Bld: 98 mg/dL (ref 65–99)
Potassium: 4.9 mmol/L (ref 3.5–5.1)
Sodium: 139 mmol/L (ref 135–145)

## 2016-03-28 LAB — CBC
HCT: 35 % — ABNORMAL LOW (ref 39.0–52.0)
Hemoglobin: 10.9 g/dL — ABNORMAL LOW (ref 13.0–17.0)
MCH: 31.1 pg (ref 26.0–34.0)
MCHC: 31.1 g/dL (ref 30.0–36.0)
MCV: 100 fL (ref 78.0–100.0)
Platelets: 326 10*3/uL (ref 150–400)
RBC: 3.5 MIL/uL — ABNORMAL LOW (ref 4.22–5.81)
RDW: 12.3 % (ref 11.5–15.5)
WBC: 11 10*3/uL — ABNORMAL HIGH (ref 4.0–10.5)

## 2016-03-28 MED ORDER — POLYETHYLENE GLYCOL 3350 17 G PO PACK
17.0000 g | PACK | Freq: Every day | ORAL | Status: DC
  Administered 2016-03-28 – 2016-03-29 (×2): 17 g via ORAL
  Filled 2016-03-28 (×2): qty 1

## 2016-03-28 MED ORDER — ENOXAPARIN SODIUM 30 MG/0.3ML ~~LOC~~ SOLN
30.0000 mg | SUBCUTANEOUS | Status: DC
  Administered 2016-03-28: 30 mg via SUBCUTANEOUS
  Filled 2016-03-28: qty 0.3

## 2016-03-28 MED ORDER — CIPROFLOXACIN IN D5W 400 MG/200ML IV SOLN
400.0000 mg | Freq: Two times a day (BID) | INTRAVENOUS | Status: DC
  Administered 2016-03-28 (×2): 400 mg via INTRAVENOUS
  Filled 2016-03-28 (×2): qty 200

## 2016-03-28 MED ORDER — BISACODYL 10 MG RE SUPP
10.0000 mg | Freq: Every day | RECTAL | Status: DC | PRN
  Administered 2016-03-28: 10 mg via RECTAL
  Filled 2016-03-28: qty 1

## 2016-03-28 MED ORDER — ENOXAPARIN SODIUM 40 MG/0.4ML ~~LOC~~ SOLN: 40.0000 mg | SUBCUTANEOUS | Status: DC

## 2016-03-28 NOTE — Progress Notes (Signed)
Physical Therapy Treatment Patient Details Name: [name] Small MRN: [id number] DOB: 12-18-1927 Today's Date: 03/28/2016    History of Present Illness Admitted with diabetic L foot ulcer. Underwent partial amputation of 1st ray of lt foot on 03/27/16.Hx of DMII, CKD, CAD, PVD, Afib, CHF. From home with wife. PTA (sickness), pt independent with ADL's. Owns cane and uses home Oxygen    PT Comments    Pt now with toe amputation and is now NWB and in CAM boot. This will greatly limit mobility. Goals updated and continue to recommend ST-SNF.  Follow Up Recommendations  SNF     Equipment Recommendations  Rolling walker with 5" wheels;Wheelchair (measurements PT);Wheelchair cushion (measurements PT)    Recommendations for Other Services       Precautions / Restrictions Precautions Precautions: Fall Required Braces or Orthoses: Other Brace/Splint Other Brace/Splint: CAM walker on lt Restrictions Weight Bearing Restrictions: Yes LLE Weight Bearing: Non weight bearing    Mobility  Bed Mobility Overal bed mobility: Needs Assistance Bed Mobility: Supine to Sit     Supine to sit: Supervision     General bed mobility comments: supervision for safety  Transfers Overall transfer level: Needs assistance Equipment used: None Transfers: Sit to/from [employer] Transfers Sit to Stand: Mod assist Stand pivot transfers: Mod assist       General transfer comment: Assist to bring hips and trunk up and to maintain NWB on lt. Verbal cues for hand placement and for NWB. Pivoted to recliner with walker. Kept my foot under pts lt foot to monitor and limit wt bearing  Ambulation/Gait                 Stairs            Wheelchair Mobility    Modified Rankin (Stroke Patients Only)       Balance Overall balance assessment: History of Falls                                  Cognition Arousal/Alertness: Awake/alert Behavior During Therapy:  Impulsive Overall Cognitive Status: Impaired/Different from baseline Area of Impairment: Safety/judgement     Memory: Decreased recall of precautions   Safety/Judgement: Decreased awareness of safety;Decreased awareness of deficits     General Comments: Repeated verbal cues and reminders that he cannot mobilize without assistance    Exercises      General Comments        Pertinent Vitals/Pain Pain Assessment: No/denies pain    Home Living                      Prior Function            PT Goals (current goals can now be found in the care plan section) Acute Rehab PT Goals Patient Stated Goal: to get back to indpendence PT Goal Formulation: With patient/family Time For Goal Achievement: 04/11/16 Potential to Achieve Goals: Good Progress towards PT goals: Goals downgraded-see care plan    Frequency  Min 3X/week    PT Plan Current plan remains appropriate    Co-evaluation             End of Session Equipment Utilized During Treatment: Gait belt;Oxygen;Other (comment) (CAM boot) Activity Tolerance: Patient tolerated treatment well Patient left: with call bell/phone within reach;with family/visitor present;in chair;with chair alarm set     Time: [id number] PT Time Calculation (min) (ACUTE ONLY): 31 min  Charges:  $Therapeutic Activity: 8-22 mins                    G Codes:      Maxximus Gotay 2016/04/22, 2:42 PM [facility] PT [phone]

## 2016-03-28 NOTE — Progress Notes (Signed)
Patient stated he has not had a bowel movement since Saturday. Dr. Darrick Meigs paged to make aware.

## 2016-03-28 NOTE — Progress Notes (Addendum)
Pharmacy Antibiotic Note [name] Small is a 82 y.o. male admitted on 03/23/2016 with worsening left foot wound MRI concerning for osteo. S/p wound debridement and partial first ray amputation on 5/17. Currently on IV cipro and vancomycin. AoCKD, Scr 2.1 > 2.8, est. CrCl 30 - 40 ml/min, stable.  Antimicrobials:  Zosyn 5/14>> 5/16  Vancomycin 5/14>>   Cipro 5/16 >>   Pertinent Culture data: 5/13 BCx: ngtd 5/11 wound - e.coli, PSA, enterococcus faecalis (all pan sens)  5/17 tissue cx: px   Plan: 1. With improvement in renal function will adjust Cipro 400 mg IV Q12H 2. Continue Vancomycin 1 g IV Q 24h for now, will obtain VT later today to evaluate current dosing regimen; goal 15- 20 3. Await micro data from sx and narrow abx as feasible  Height: 6' 2.4" (189 cm) Weight: 201 lb 12.8 oz (91.536 kg) IBW/kg (Calculated) : 83.12  Temp (24hrs), Avg:97.8 F (36.6 C), Min:97.5 F (36.4 C), Max:98.4 F (36.9 C)   Recent Labs Lab 03/23/16 2304 03/23/16 2311 03/26/16 0622 03/27/16 0420 03/28/16 0409  WBC 9.8  --   --  9.9 11.0*  CREATININE 2.20*  --  2.14*  --  1.84*  LATICACIDVEN  --  0.79  --   --   --     Estimated Creatinine Clearance: 34.5 mL/min (by C-G formula based on Cr of 1.84).    Allergies  Allergen Reactions  . Actos [Pioglitazone Hydrochloride] Other (See Comments)    Held 2012 bladder cancer  . Aspirin Other (See Comments)    Held 2012 due to hematuria and bruising.   . Ace Inhibitors Cough  . Gabapentin     Upset stomach and diarrhea - tolerates low doses  . Lyrica [Pregabalin] Other (See Comments)    swelling  . Pravastatin Other (See Comments)    Swelling of feet  . Bactrim Rash and Other (See Comments)    Rash, presumed allergy  . Sulfa Drugs Cross Reactors Rash    Vincenza Hews, PharmD, BCPS 03/28/2016, 10:39 AM Pager: [phone]  Addendum:  PM trough is within range but Scr is trending down. May require adjustment soon.  Salome Arnt,  PharmD, BCPS Pager # [phone] 03/28/2016 5:43 PM

## 2016-03-28 NOTE — Telephone Encounter (Signed)
I will go by the hospital tonight after I finish in [place]. If I don't take the drain out before his discharge, I will have him come next week to get it out.

## 2016-03-28 NOTE — Progress Notes (Signed)
Subjective: POD #1 s/p left partial 1st ray amputation. No acute overnight events. He denies any fevers, chills, nausea, vomiting. His pain is controlled. No new complaints today.  Objective: Blood pressure 116/48, pulse 71, temperature 98.2 F (36.8 C), temperature source Oral, resp. rate 18, height 6' 2.4" (1.89 m), weight 201 lb 12.8 oz (91.536 kg), SpO2 96 %.  AAO 3, NAD; wife at bedside Dressing is clean, dry, intact the left foot. There is an immediate capillary refill time the remaining digits. There is no strikethrough. Drain is intact. Less than 5 mL of bloody drainage currently in the drain. There is no pain with calf compression, swelling, warmth, erythema. No other open lesions are present.  CBC    Component Value Date/Time   WBC 11.0* 03/28/2016 0409   WBC 8.5 02/23/2016 0942   RBC 3.50* 03/28/2016 0409   RBC 4.32 02/23/2016 0942   HGB 10.9* 03/28/2016 0409   HCT 35.0* 03/28/2016 0409   HCT 41.5 02/23/2016 0942   PLT 326 03/28/2016 0409   PLT 273 02/23/2016 0942   MCV 100.0 03/28/2016 0409   MCV 96 02/23/2016 0942   MCH 31.1 03/28/2016 0409   MCH 32.6 02/23/2016 0942   MCHC 31.1 03/28/2016 0409   MCHC 34.0 02/23/2016 0942   RDW 12.3 03/28/2016 0409   RDW 13.4 02/23/2016 0942   LYMPHSABS 1.2 03/23/2016 2304   MONOABS 1.4* 03/23/2016 2304   EOSABS 0.3 03/23/2016 2304   BASOSABS 0.0 03/23/2016 2304   BMP Latest Ref Rng 03/28/2016 03/26/2016 03/23/2016  Glucose 65 - 99 mg/dL 98 220(H) 109(H)  BUN 6 - 20 mg/dL 23(H) 30(H) 38(H)  Creatinine 0.61 - 1.24 mg/dL 1.84(H) 2.14(H) 2.20(H)  Sodium 135 - 145 mmol/L 139 137 136  Potassium 3.5 - 5.1 mmol/L 4.9 4.3 4.2  Chloride 101 - 111 mmol/L 97(L) 94(L) 97(L)  CO2 22 - 32 mmol/L 34(H) 32 28  Calcium 8.9 - 10.3 mg/dL 8.3(L) 8.5(L) 8.9    Pathology- Clean margin negative for osteomyelitis.  Assessment: Postoperative day #1 status post left first partial ray amputation, doing well  Plan: -Treatment options discussed  including all alternatives, risks, and complications -Continue nonweightbearing. Seen by physical therapy will be discharged to skilled nursing facility. -Continue IV and orthotics while inpatient. Discharge home with 2 weeks of Augmentin. Monitor CBC tomorrow. Increase in white blood cell count likely postsurgical. -Will likely remove the drain on Monday or Tuesday next week. If discharged to skilled nursing facility they can remove the drain next week after discussion with them for follow-up in the office for drain removal. We'll make appointment for follow-up pending discharge. -Will follow cultures upon discharge if not available before then. -Continue care per primary team. -I will continue to follow him while an inpatient.  Celesta Gentile, [place] [phone]

## 2016-03-28 NOTE — Progress Notes (Signed)
[facility]  PROGRESS NOTE  [name] Small [id number] DOB: [birth year] DOA: 03/23/2016 PCP: [name], MD    Brief HPI:  82 y/o male  type II DM CKD III  CAD -stress test for DOE, it showed mostly fixed inferior wall defect with minimal periinfarct ischemia. He was started on pravastatin that he couldn't tolerate and Plavix as he is allergic to aspirin -R + L Heart cath 04/2011 ,Non-ob CAD CHF Afib Bladder Ca s/p Chemo PVD -noninvasive vascular evaluation in 01/2016 showed an ABI of 1.0 on the right and 0.71 on the left -admission 4/19-->02/29/16 critical LE ischemia, L metatarsal head wound. He underwent an angioplasty at that time which showed good blood flow   Patient admitted 03/24/2016 with losing and malodorous wound which was initially treated with Cipro and then Augmentin and he finally came to theED.  Active Problems:   Diabetic ulcer of left foot ([place])   Diabetic ulcer of left foot associated with diabetes mellitus due to underlying condition ([place])   Assessment/Plan: 1. Status post left partial first ray amputation- patient presented with diabetic foot ulcer, osteomyelitis, was was started on vancomycin and ciprofloxacin. Was seen by podiatry and underwent left partial first ray amputation today. Pain is well controlled. Cord discussed with Dr. Earleen Newport, podiatry. He recommends to continue IV antibiotics the patient is in the hospital and then sent home on oral antibiotics Augmentin for 2 weeks. 2. History of COPD-no wheezing at this time, patient is currently on oxygen, O2 sats 97% on 2 L nasal cannula. Continue Dulera, also start DuoNeb nebulizers every 6 hours when necessary for shortness of breath. 3. History of diastolic heart failure- patient has history of grade 1 diastolic dysfunction, has been on Lasix 40 mg every other day at home. Lasix has been on hold in the hospital, BNP was checked yesterday, BNP is 125.3. Continue to hold Lasix at this time as  patient is not in acute diastolic heart failure. 4. Diabetes mellitus- continue Lantus 25 units subcutaneous daily, sliding scale insulin with NovoLog. 5. Hypertension- continue Norvasc, blood pressure is controlled. 6. CAD- continue Plavix 7. Chronic kidney disease stage III- patient's baseline creatinine 1.48, he presented with creatinine of 2.20. Lasix currently on hold. Today creatinine is 1.84   DVT prophylaxis: Lovenox Code Status: Full code Family Communication: Discussed with family members at bedside Disposition Plan: Awaiting PT eval   Consultants:  Dr. Clementeen Graham  Dr Fletcher Anon  Procedures:  S/p left partial 1st ray amputation  Antibiotics:  Vancomycin  Ciprofloxacin  Subjective: Patient seen and examined, denies any complaints. No shortness of breath or chest pain. Objective: Filed Vitals:   03/28/16 0558 03/28/16 0900 03/28/16 0955 03/28/16 1419  BP: 118/57  127/56 116/48  Pulse: 72  72 71  Temp: 98.4 F (36.9 C)   98.2 F (36.8 C)  TempSrc: Oral   Oral  Resp:    18  Height:      Weight:      SpO2: 94% 94%  94%    Intake/Output Summary (Last 24 hours) at 03/28/16 1421 Last data filed at 03/28/16 1131  Gross per 24 hour  Intake    358 ml  Output    705 ml  Net   -347 ml   Filed Weights   03/23/16 2116 03/24/16 0137  Weight: 87.998 kg (194 lb) 91.536 kg (201 lb 12.8 oz)    Examination:  General exam: Appears calm and comfortable  Respiratory system: Scattered wheezing bilaterally, Respiratory effort normal. Cardiovascular  system: S1 & S2 heard, RRR. No JVD, murmurs, rubs, gallops or clicks. No pedal edema. Central nervous system: Alert and oriented. No focal neurological deficits. Extremities: . Left foot in dressing Psychiatry: Judgement and insight appear normal. Mood & affect appropriate.    Data Reviewed: I have personally reviewed following labs and imaging studies Basic Metabolic Panel:  Recent Labs Lab 03/23/16 2304 03/26/16 0622  03/28/16 0409  NA 136 137 139  K 4.2 4.3 4.9  CL 97* 94* 97*  CO2 28 32 34*  GLUCOSE 109* 220* 98  BUN 38* 30* 23*  CREATININE 2.20* 2.14* 1.84*  CALCIUM 8.9 8.5* 8.3*   CBC:  Recent Labs Lab 03/23/16 2304 03/27/16 0420 03/28/16 0409  WBC 9.8 9.9 11.0*  NEUTROABS 6.9  --   --   HGB 12.9* 10.7* 10.9*  HCT 40.1 33.7* 35.0*  MCV 98.0 98.3 100.0  PLT 251 340 326    CBG:  Recent Labs Lab 03/27/16 1154 03/27/16 1652 03/27/16 2142 03/28/16 0808 03/28/16 1154  GLUCAP 214* 116* 117* 87 195*    Recent Results (from the past 240 hour(s))  Wound culture     Status: None   Collection Time: 03/21/16  2:08 PM  Result Value Ref Range Status   Specimen Description WOUND  Final   Special Requests NONE  Final   Gram Stain   Final    FEW WBC PRESENT, PREDOMINANTLY PMN FEW GRAM POSITIVE COCCI RARE GRAM NEGATIVE RODS    Culture   Final    MODERATE GROWTH ESCHERICHIA COLI LIGHT GROWTH PSEUDOMONAS AERUGINOSA MODERATE GROWTH ENTEROCOCCUS FAECALIS    Report Status 03/24/2016 FINAL  Final   Organism ID, Bacteria ESCHERICHIA COLI  Final   Organism ID, Bacteria PSEUDOMONAS AERUGINOSA  Final   Organism ID, Bacteria ENTEROCOCCUS FAECALIS  Final      Susceptibility   Escherichia coli - MIC*    AMPICILLIN 4 SENSITIVE Sensitive     CEFAZOLIN <=4 SENSITIVE Sensitive     CEFEPIME <=1 SENSITIVE Sensitive     CEFTAZIDIME <=1 SENSITIVE Sensitive     CEFTRIAXONE <=1 SENSITIVE Sensitive     CIPROFLOXACIN <=0.25 SENSITIVE Sensitive     GENTAMICIN <=1 SENSITIVE Sensitive     IMIPENEM <=0.25 SENSITIVE Sensitive     TRIMETH/SULFA <=20 SENSITIVE Sensitive     AMPICILLIN/SULBACTAM <=2 SENSITIVE Sensitive     PIP/TAZO <=4 SENSITIVE Sensitive     Extended ESBL NEGATIVE Sensitive     * MODERATE GROWTH ESCHERICHIA COLI   Pseudomonas aeruginosa - MIC*    CEFTAZIDIME 4 SENSITIVE Sensitive     CIPROFLOXACIN <=0.25 SENSITIVE Sensitive     GENTAMICIN <=1 SENSITIVE Sensitive     IMIPENEM 2  SENSITIVE Sensitive     CEFEPIME 2 SENSITIVE Sensitive     PIP/TAZO Value in next row Sensitive      SENSITIVE8    * LIGHT GROWTH PSEUDOMONAS AERUGINOSA   Enterococcus faecalis - MIC*    AMPICILLIN Value in next row Sensitive      SENSITIVE8    VANCOMYCIN Value in next row Sensitive      SENSITIVE8    GENTAMICIN SYNERGY Value in next row Sensitive      SENSITIVE8    LINEZOLID Value in next row Sensitive      SENSITIVE8    * MODERATE GROWTH ENTEROCOCCUS FAECALIS  Blood culture (routine x 2)     Status: None   Collection Time: 03/23/16  9:45 PM  Result Value Ref Range Status   Specimen  Description BLOOD LEFT ANTECUBITAL  Final   Special Requests BOTTLES DRAWN AEROBIC ONLY 10CC  Final   Culture NO GROWTH 5 DAYS  Final   Report Status 03/28/2016 FINAL  Final  Blood culture (routine x 2)     Status: None   Collection Time: 03/23/16  9:50 PM  Result Value Ref Range Status   Specimen Description BLOOD LEFT HAND  Final   Special Requests IN PEDIATRIC BOTTLE 3CC  Final   Culture NO GROWTH 5 DAYS  Final   Report Status 03/28/2016 FINAL  Final  Surgical pcr screen     Status: None   Collection Time: 03/26/16 10:12 PM  Result Value Ref Range Status   MRSA, PCR NEGATIVE NEGATIVE Final   Staphylococcus aureus NEGATIVE NEGATIVE Final    Comment:        The Xpert SA Assay (FDA approved for NASAL specimens in patients over 73 years of age), is one component of a comprehensive surveillance program.  Test performance has been validated by [facility] for patients greater than or equal to 72 year old. It is not intended to diagnose infection nor to guide or monitor treatment.   Anaerobic culture     Status: None (Preliminary result)   Collection Time: 03/27/16 10:45 AM  Result Value Ref Range Status   Specimen Description TISSUE  Final   Special Requests CLEAN FIRST METATARSAL LEFT RAY SWABS SPECIMEN A  Final   Gram Stain   Final    NO WBC SEEN NO ORGANISMS SEEN Performed at  [facility]    Culture PENDING  Incomplete   Report Status PENDING  Incomplete  Tissue culture     Status: None (Preliminary result)   Collection Time: 03/27/16 10:45 AM  Result Value Ref Range Status   Specimen Description TISSUE  Final   Special Requests CLEAN FIRST METATARSAL LEFT RAY SWABS SPECIMEN A  Final   Gram Stain   Final    NO WBC SEEN NO ORGANISMS SEEN Performed at [facility]    Culture PENDING  Incomplete   Report Status PENDING  Incomplete     Studies: Dg Foot 2 Views Left  03/27/2016  CLINICAL DATA:  Post partial amputation left foot EXAM: LEFT FOOT - 2 VIEW COMPARISON:  03/23/2016 FINDINGS: Two views of the left foot submitted. The patient is status post amputation of mid and distal aspect first metatarsal and first toe. A surgical drain is noted in the region of first metatarsal. The base of first metatarsal remnant is unremarkable. The alignment is preserved IMPRESSION: Status post amputation of mid and distal aspect of first metatarsal and left great toe. Postsurgical changes with drain in place. Electronically Signed   By: Lahoma Crocker M.D.   On: 03/27/2016 13:09    Scheduled Meds: . amLODipine  2.5 mg Oral Daily  . atorvastatin  10 mg Oral Daily  . ciprofloxacin  400 mg Intravenous Q12H  . clobetasol ointment  1 application Topical Daily  . clopidogrel  75 mg Oral Daily  . docusate sodium  100 mg Oral BID  . enoxaparin (LOVENOX) injection  30 mg Subcutaneous Q24H  . gabapentin  100 mg Oral TID  . insulin aspart  0-9 Units Subcutaneous TID WC  . insulin glargine  25 Units Subcutaneous Q24H  . mometasone-formoterol  2 puff Inhalation BID  . polyethylene glycol  17 g Oral Daily  . vancomycin  1,000 mg Intravenous Q24H   Continuous Infusions: . lactated ringers 50 mL/hr  at 03/28/16 0144       Time spent: 25 min    [facility] Pager [phone]. If 7PM-7AM, please contact night-coverage at [email or web address], Office   [phone]  password [email or web address] 03/28/2016, 2:21 PM  LOS: 4 days

## 2016-03-29 DIAGNOSIS — Z89412 Acquired absence of left great toe: Secondary | ICD-10-CM | POA: Diagnosis not present

## 2016-03-29 DIAGNOSIS — I25709 Atherosclerosis of coronary artery bypass graft(s), unspecified, with unspecified angina pectoris: Secondary | ICD-10-CM

## 2016-03-29 DIAGNOSIS — M6281 Muscle weakness (generalized): Secondary | ICD-10-CM | POA: Diagnosis not present

## 2016-03-29 DIAGNOSIS — Z9981 Dependence on supplemental oxygen: Secondary | ICD-10-CM | POA: Diagnosis not present

## 2016-03-29 DIAGNOSIS — R0602 Shortness of breath: Secondary | ICD-10-CM | POA: Diagnosis not present

## 2016-03-29 DIAGNOSIS — N183 Chronic kidney disease, stage 3 (moderate): Secondary | ICD-10-CM | POA: Diagnosis not present

## 2016-03-29 DIAGNOSIS — I4891 Unspecified atrial fibrillation: Secondary | ICD-10-CM | POA: Diagnosis not present

## 2016-03-29 DIAGNOSIS — E1151 Type 2 diabetes mellitus with diabetic peripheral angiopathy without gangrene: Secondary | ICD-10-CM | POA: Diagnosis not present

## 2016-03-29 DIAGNOSIS — M79672 Pain in left foot: Secondary | ICD-10-CM | POA: Diagnosis not present

## 2016-03-29 DIAGNOSIS — S98112D Complete traumatic amputation of left great toe, subsequent encounter: Secondary | ICD-10-CM | POA: Diagnosis not present

## 2016-03-29 DIAGNOSIS — I5030 Unspecified diastolic (congestive) heart failure: Secondary | ICD-10-CM | POA: Diagnosis not present

## 2016-03-29 DIAGNOSIS — R531 Weakness: Secondary | ICD-10-CM | POA: Diagnosis not present

## 2016-03-29 DIAGNOSIS — E11621 Type 2 diabetes mellitus with foot ulcer: Secondary | ICD-10-CM | POA: Diagnosis not present

## 2016-03-29 DIAGNOSIS — E08621 Diabetes mellitus due to underlying condition with foot ulcer: Secondary | ICD-10-CM | POA: Diagnosis not present

## 2016-03-29 DIAGNOSIS — M869 Osteomyelitis, unspecified: Secondary | ICD-10-CM | POA: Diagnosis not present

## 2016-03-29 DIAGNOSIS — Z4781 Encounter for orthopedic aftercare following surgical amputation: Secondary | ICD-10-CM | POA: Diagnosis not present

## 2016-03-29 DIAGNOSIS — I509 Heart failure, unspecified: Secondary | ICD-10-CM | POA: Diagnosis not present

## 2016-03-29 DIAGNOSIS — R296 Repeated falls: Secondary | ICD-10-CM | POA: Diagnosis not present

## 2016-03-29 DIAGNOSIS — R5381 Other malaise: Secondary | ICD-10-CM | POA: Diagnosis not present

## 2016-03-29 DIAGNOSIS — R269 Unspecified abnormalities of gait and mobility: Secondary | ICD-10-CM | POA: Diagnosis not present

## 2016-03-29 DIAGNOSIS — N189 Chronic kidney disease, unspecified: Secondary | ICD-10-CM | POA: Diagnosis not present

## 2016-03-29 DIAGNOSIS — J9600 Acute respiratory failure, unspecified whether with hypoxia or hypercapnia: Secondary | ICD-10-CM | POA: Diagnosis not present

## 2016-03-29 DIAGNOSIS — R29898 Other symptoms and signs involving the musculoskeletal system: Secondary | ICD-10-CM | POA: Diagnosis not present

## 2016-03-29 DIAGNOSIS — I5042 Chronic combined systolic (congestive) and diastolic (congestive) heart failure: Secondary | ICD-10-CM | POA: Diagnosis not present

## 2016-03-29 DIAGNOSIS — K269 Duodenal ulcer, unspecified as acute or chronic, without hemorrhage or perforation: Secondary | ICD-10-CM | POA: Diagnosis not present

## 2016-03-29 DIAGNOSIS — I10 Essential (primary) hypertension: Secondary | ICD-10-CM | POA: Diagnosis not present

## 2016-03-29 DIAGNOSIS — J449 Chronic obstructive pulmonary disease, unspecified: Secondary | ICD-10-CM | POA: Diagnosis not present

## 2016-03-29 DIAGNOSIS — R2681 Unsteadiness on feet: Secondary | ICD-10-CM | POA: Diagnosis not present

## 2016-03-29 LAB — BASIC METABOLIC PANEL
Anion gap: 7 (ref 5–15)
BUN: 24 mg/dL — ABNORMAL HIGH (ref 6–20)
CO2: 35 mmol/L — ABNORMAL HIGH (ref 22–32)
Calcium: 8.5 mg/dL — ABNORMAL LOW (ref 8.9–10.3)
Chloride: 94 mmol/L — ABNORMAL LOW (ref 101–111)
Creatinine, Ser: 1.77 mg/dL — ABNORMAL HIGH (ref 0.61–1.24)
GFR calc Af Amer: 39 mL/min — ABNORMAL LOW (ref >60)
GFR calc non Af Amer: 33 mL/min — ABNORMAL LOW (ref >60)
Glucose, Bld: 180 mg/dL — ABNORMAL HIGH (ref 65–99)
Potassium: 4.7 mmol/L (ref 3.5–5.1)
Sodium: 136 mmol/L (ref 135–145)

## 2016-03-29 LAB — CBC
HCT: 33 % — ABNORMAL LOW (ref 39.0–52.0)
Hemoglobin: 10.3 g/dL — ABNORMAL LOW (ref 13.0–17.0)
MCH: 30.8 pg (ref 26.0–34.0)
MCHC: 31.2 g/dL (ref 30.0–36.0)
MCV: 98.8 fL (ref 78.0–100.0)
Platelets: 353 10*3/uL (ref 150–400)
RBC: 3.34 MIL/uL — ABNORMAL LOW (ref 4.22–5.81)
RDW: 12 % (ref 11.5–15.5)
WBC: 9.2 10*3/uL (ref 4.0–10.5)

## 2016-03-29 LAB — GLUCOSE, CAPILLARY
Glucose-Capillary: 163 mg/dL — ABNORMAL HIGH (ref 65–99)
Glucose-Capillary: 234 mg/dL — ABNORMAL HIGH (ref 65–99)

## 2016-03-29 MED ORDER — OXYCODONE HCL 5 MG PO TABS: 5.0000 mg | ORAL_TABLET | Freq: Four times a day (QID) | ORAL | Status: DC | PRN

## 2016-03-29 MED ORDER — IPRATROPIUM-ALBUTEROL 0.5-2.5 (3) MG/3ML IN SOLN: 3.0000 mL | Freq: Four times a day (QID) | RESPIRATORY_TRACT | Status: DC | PRN

## 2016-03-29 MED ORDER — AMOXICILLIN-POT CLAVULANATE 875-125 MG PO TABS: 1.0000 | ORAL_TABLET | Freq: Two times a day (BID) | ORAL | Status: AC

## 2016-03-29 MED ORDER — POLYETHYLENE GLYCOL 3350 17 G PO PACK: 17.0000 g | PACK | Freq: Every day | ORAL | Status: DC | PRN

## 2016-03-29 MED ORDER — INSULIN ASPART 100 UNIT/ML ~~LOC~~ SOLN: 0.0000 [IU] | Freq: Three times a day (TID) | SUBCUTANEOUS | Status: DC

## 2016-03-29 NOTE — Progress Notes (Signed)
Patient will DC to: [facility] Anticipated DC date: 03/29/16 Family notified: Spouse Transport by: Corey Harold   Per MD patient ready for DC to [facility]. RN, patient, patient's family, and facility notified of DC. RN given number for report. DC packet on chart. Ambulance transport requested for patient.   CSW signing off.  Cedric Fishman, [place] Social Worker [phone]

## 2016-03-29 NOTE — Clinical Social Work Placement (Signed)
   CLINICAL SOCIAL WORK PLACEMENT  NOTE  Date:  03/29/2016  Patient Details  Name: [name] Small MRN: [id number] Date of Birth: 1928-02-27  Clinical Social Work is seeking post-discharge placement for this patient at the [place] level of care (*CSW will initial, date and re-position this form in  chart as items are completed):  Yes   Patient/family provided with [place] Work Department's list of facilities offering this level of care within the geographic area requested by the patient (or if unable, by the patient's family).  Yes   Patient/family informed of their freedom to choose among providers that offer the needed level of care, that participate in Medicare, Medicaid or managed care program needed by the patient, have an available bed and are willing to accept the patient.  Yes   Patient/family informed of 's ownership interest in [facility], as well as of the fact that they are under no obligation to receive care at these facilities.  PASRR submitted to EDS on 03/25/16     PASRR number received on 03/25/16     Existing PASRR number confirmed on       FL2 transmitted to all facilities in geographic area requested by pt/family on 03/25/16     FL2 transmitted to all facilities within larger geographic area on       Patient informed that his/her managed care company has contracts with or will negotiate with certain facilities, including the following:        Yes   Patient/family informed of bed offers received.  Patient chooses bed at [place], [place]     Physician recommends and patient chooses bed at      Patient to be transferred to [place], [place] on 03/29/16.  Patient to be transferred to facility by [facility]     Patient family notified on 03/29/16 of transfer.  Name of family member notified:  Spouse at bedside     PHYSICIAN       Additional Comment:     _______________________________________________ Benard Halsted, [place] 03/29/2016, 11:58 AM

## 2016-03-29 NOTE — Discharge Summary (Signed)
Physician Discharge Summary  [name] Small [id number] DOB: Jul 31, 1928 DOA: 03/23/2016  PCP: [name], MD  Admit date: 03/23/2016 Discharge date: 03/29/2016  Time spent: 25* minutes  Recommendations for Outpatient Follow-up:  1. Follow up Podiatry in one week   Discharge Diagnoses:  Active Problems:   CAD (coronary artery disease) of artery bypass graft   HTN (hypertension)   Diabetic ulcer of left foot (HCC)   Diabetic ulcer of left foot associated with diabetes mellitus due to underlying condition [facility])   Discharge Condition: Stable  Diet recommendation: Low salt diet  Filed Weights   03/23/16 2116 03/24/16 0137  Weight: 87.998 kg (194 lb) 91.536 kg (201 lb 12.8 oz)    History of present illness:  82 y/o male  type II DM CKD III  CAD -stress test for DOE, it showed mostly fixed inferior wall defect with minimal periinfarct ischemia. He was started on pravastatin that he couldn't tolerate and Plavix as he is allergic to aspirin -R + L Heart cath 04/2011 ,Non-ob CAD CHF Afib Bladder Ca s/p Chemo PVD -noninvasive vascular evaluation in 01/2016 showed an ABI of 1.0 on the right and 0.71 on the left -admission 4/19-->02/29/16 critical LE ischemia, L metatarsal head wound. He underwent an angioplasty at that time which showed good blood flow   Patient admitted 03/24/2016 with losing and malodorous wound which was initially treated with Cipro and then Augmentin and he finally came to theED.  Hospital Course:    Status post left partial first ray amputation- patient presented with diabetic foot ulcer, osteomyelitis, Patient was started on vancomycin and ciprofloxacin. Was seen by podiatry and underwent left partial first ray amputation today. Pain is well controlled. Cord discussed with Dr. Earleen Newport, podiatry. He recommends to continue IV antibiotics the patient is in the hospital and then sent to SNF  on oral antibiotics Augmentin for 2 weeks. Will discharge on  Augmentin 1 tab po bid for two weeks.Will give Oxycodone  5 mg po q 8 hr prn.  History of COPD-no wheezing at this time, patient is currently on oxygen, O2 sats 97% on 2 L nasal cannula. Continue Dulera, also start DuoNeb nebulizers every 6 hours when necessary for shortness of breath.  History of diastolic heart failure- patient has history of grade 1 diastolic dysfunction, has been on Lasix 40 mg every other day at home. Lasix has been on hold in the hospital, BNP was checked yesterday, BNP is 125.3. Will restart lasix 40 mg po three times a week  Diabetes mellitus- continue Lantus 25 units subcutaneous daily, sliding scale insulin with NovoLog. Restart Glucotrol.  Hypertension- continue Norvasc, blood pressure is controlled.  CAD- continue Plavix  Chronic kidney disease stage III- patient's baseline creatinine 1.48, he presented with creatinine of 2.20.  Today creatinine is 1.77  Procedures:  S/p left partial 1st ray amputation  Consultations:  Podiatry  Discharge Exam: Filed Vitals:   03/29/16 0527 03/29/16 0932  BP: 118/57 123/54  Pulse: 69 72  Temp: 98 F (36.7 C)   Resp: 16     General: Appears in no acute distress Cardiovascular: S1s2 rrr Respiratory: clear bilaterally  Discharge Instructions   Discharge Instructions    Diet - low sodium heart healthy    Complete by:  As directed      Increase activity slowly    Complete by:  As directed           Current Discharge Medication List    START taking these medications  Details  insulin aspart (NOVOLOG) 100 UNIT/ML injection Inject 0-9 Units into the skin 3 (three) times daily with meals. Qty: 10 mL, Refills: 11    ipratropium-albuterol (DUONEB) 0.5-2.5 (3) MG/3ML SOLN Take 3 mLs by nebulization every 6 (six) hours as needed. Qty: 360 mL, Refills: 0    polyethylene glycol (MIRALAX / GLYCOLAX) packet Take 17 g by mouth daily as needed. Qty: 14 each, Refills: 0      CONTINUE these medications which have  CHANGED   Details  amoxicillin-clavulanate (AUGMENTIN) 875-125 MG tablet Take 1 tablet by mouth 2 (two) times daily. Qty: 28 tablet, Refills: 0      CONTINUE these medications which have NOT CHANGED   Details  acetaminophen (TYLENOL) 500 MG tablet Take 1,000 mg by mouth every 8 (eight) hours as needed for mild pain, moderate pain or headache.    amLODipine (NORVASC) 5 MG tablet Take 1 tablet (5 mg total) by mouth daily.   Associated Diagnoses: Atherosclerosis of native coronary artery of native heart without angina pectoris    atorvastatin (LIPITOR) 10 MG tablet Take 1 tablet (10 mg total) by mouth daily. Qty: 90 tablet, Refills: 3   Associated Diagnoses: Hyperlipidemia; Atrial fibrillation, unspecified type (HCC)    budesonide-formoterol (SYMBICORT) 160-4.5 MCG/ACT inhaler Inhale 2 puffs into the lungs 2 (two) times daily as needed (shortness of breath).     clobetasol (TEMOVATE) 0.05 % external solution Apply 1 application topically daily.    clopidogrel (PLAVIX) 75 MG tablet Take 1 tablet by mouth  daily Qty: 90 tablet, Refills: 1    collagenase (SANTYL) ointment Apply 1 application topically daily.    furosemide (LASIX) 40 MG tablet TAKE 1 TABLET BY MOUTH  EVERY MONDAY, WEDNESDAY,  AND FRIDAY Qty: 45 tablet, Refills: 1    gabapentin (NEURONTIN) 100 MG capsule Take 100 mg by mouth 3 (three) times daily.    glipiZIDE (GLUCOTROL XL) 5 MG 24 hr tablet Take 1 tablet by mouth  daily with breakfast Qty: 90 tablet, Refills: 1    insulin glargine (LANTUS) 100 UNIT/ML injection Inject 25 Units into the skin daily.    Oxycodone 5 mg 1 tab po q 6 hr prn     STOP taking these medications     ciprofloxacin (CIPRO) 500 MG tablet        Allergies  Allergen Reactions  . Actos [Pioglitazone Hydrochloride] Other (See Comments)    Held 2012 bladder cancer  . Aspirin Other (See Comments)    Held 2012 due to hematuria and bruising.   . Ace Inhibitors Cough  . Gabapentin     Upset  stomach and diarrhea - tolerates low doses  . Lyrica [Pregabalin] Other (See Comments)    swelling  . Pravastatin Other (See Comments)    Swelling of feet  . Bactrim Rash and Other (See Comments)    Rash, presumed allergy  . Sulfa Drugs Cross Reactors Rash      The results of significant diagnostics from this hospitalization (including imaging, microbiology, ancillary and laboratory) are listed below for reference.    Significant Diagnostic Studies: Mr Foot Left Wo Contrast  03/24/2016  CLINICAL DATA:  Diabetic foot ulcer of the left foot. Surrounding erythema and purulent discharge. EXAM: MRI OF THE LEFT FOREFOOT WITHOUT CONTRAST TECHNIQUE: Multiplanar, multisequence MR imaging was performed. No intravenous contrast was administered. COMPARISON:  None. FINDINGS: Bones/Joint/Cartilage Soft tissue ulcer overlying the medial aspect of the first metatarsal head with mild underlying cortical irregularity with associated marrow edema most  concerning for new osteomyelitis. Marrow edema within the medial hallux sesamoid. No other marrow signal abnormality. No fracture or dislocation. Normal alignment. No joint effusion. Collaterals Collateral ligaments are intact. Tendons Flexor and extensor compartment tendons are intact. Muscles Normal. Soft tissue No fluid collection or hematoma.  No soft tissue mass. IMPRESSION: 1. Soft tissue ulcer overlying the medial aspect of the first metatarsal head. Mild underlying cortical irregularity with associated marrow edema most concerning for new osteomyelitis. Marrow edema in the medial hallux sesamoid without cortical destruction which may reflect reactive marrow changes versus early osteomyelitis. Electronically Signed   By: Kathreen Devoid   On: 03/24/2016 15:46   Dg Foot 2 Views Left  03/27/2016  CLINICAL DATA:  Post partial amputation left foot EXAM: LEFT FOOT - 2 VIEW COMPARISON:  03/23/2016 FINDINGS: Two views of the left foot submitted. The patient is status  post amputation of mid and distal aspect first metatarsal and first toe. A surgical drain is noted in the region of first metatarsal. The base of first metatarsal remnant is unremarkable. The alignment is preserved IMPRESSION: Status post amputation of mid and distal aspect of first metatarsal and left great toe. Postsurgical changes with drain in place. Electronically Signed   By: Lahoma Crocker M.D.   On: 03/27/2016 13:09   Dg Foot 2 Views Left  03/14/2016  CLINICAL DATA:  Nonhealing wound. EXAM: LEFT FOOT - 2 VIEW COMPARISON:  February 08, 2016. FINDINGS: Probable old fracture involving fifth proximal phalanx. No acute fracture or dislocation is noted. No lytic destruction is seen to suggest osteomyelitis. Vascular calcifications are noted. Soft tissue ulcer is seen medial to distal first metatarsal. IMPRESSION: No evidence of osteomyelitis. Soft tissue ulcer medial to distal first metatarsal. Electronically Signed   By: Marijo Conception, M.D.   On: 03/14/2016 15:38   Dg Foot Complete Left  03/23/2016  CLINICAL DATA:  82 year old male with infected wound in the medial aspect of the left foot adjacent to the MTP joint. EXAM: LEFT FOOT - COMPLETE 3+ VIEW COMPARISON:  Radiographs dated 03/14/2016 FINDINGS: There is no acute fracture or dislocation. The bones are osteopenic. There is a focal area of decreased mineralization of the distal aspect of the first metatarsal which is new from prior study. Osteomyelitis is not excluded. Correlation with clinical exam recommended. MRI or white blood cell nuclear scan may provide better evaluation. There is no periosteal elevation or bone erosion. There is skin irregularity medial to the first metatarsophalangeal joint compatible with known skin wound. No radiopaque foreign object identified. No soft tissue gas. IMPRESSION: No acute fracture or dislocation. Skin wound along the medial aspect of the first MTP. There is focal area of decreased mineralization involving the head of  the first metatarsal bone adjacent to the skin wound. Osteomyelitis is not excluded. MRI may provide better evaluation. Electronically Signed   By: Anner Crete M.D.   On: 03/23/2016 22:53    Microbiology: Recent Results (from the past 240 hour(s))  Wound culture     Status: None   Collection Time: 03/21/16  2:08 PM  Result Value Ref Range Status   Specimen Description WOUND  Final   Special Requests NONE  Final   Gram Stain   Final    FEW WBC PRESENT, PREDOMINANTLY PMN FEW GRAM POSITIVE COCCI RARE GRAM NEGATIVE RODS    Culture   Final    MODERATE GROWTH ESCHERICHIA COLI LIGHT GROWTH PSEUDOMONAS AERUGINOSA MODERATE GROWTH ENTEROCOCCUS FAECALIS    Report Status 03/24/2016  FINAL  Final   Organism ID, Bacteria ESCHERICHIA COLI  Final   Organism ID, Bacteria PSEUDOMONAS AERUGINOSA  Final   Organism ID, Bacteria ENTEROCOCCUS FAECALIS  Final      Susceptibility   Escherichia coli - MIC*    AMPICILLIN 4 SENSITIVE Sensitive     CEFAZOLIN <=4 SENSITIVE Sensitive     CEFEPIME <=1 SENSITIVE Sensitive     CEFTAZIDIME <=1 SENSITIVE Sensitive     CEFTRIAXONE <=1 SENSITIVE Sensitive     CIPROFLOXACIN <=0.25 SENSITIVE Sensitive     GENTAMICIN <=1 SENSITIVE Sensitive     IMIPENEM <=0.25 SENSITIVE Sensitive     TRIMETH/SULFA <=20 SENSITIVE Sensitive     AMPICILLIN/SULBACTAM <=2 SENSITIVE Sensitive     PIP/TAZO <=4 SENSITIVE Sensitive     Extended ESBL NEGATIVE Sensitive     * MODERATE GROWTH ESCHERICHIA COLI   Pseudomonas aeruginosa - MIC*    CEFTAZIDIME 4 SENSITIVE Sensitive     CIPROFLOXACIN <=0.25 SENSITIVE Sensitive     GENTAMICIN <=1 SENSITIVE Sensitive     IMIPENEM 2 SENSITIVE Sensitive     CEFEPIME 2 SENSITIVE Sensitive     PIP/TAZO Value in next row Sensitive      SENSITIVE8    * LIGHT GROWTH PSEUDOMONAS AERUGINOSA   Enterococcus faecalis - MIC*    AMPICILLIN Value in next row Sensitive      SENSITIVE8    VANCOMYCIN Value in next row Sensitive      SENSITIVE8     GENTAMICIN SYNERGY Value in next row Sensitive      SENSITIVE8    LINEZOLID Value in next row Sensitive      SENSITIVE8    * MODERATE GROWTH ENTEROCOCCUS FAECALIS  Blood culture (routine x 2)     Status: None   Collection Time: 03/23/16  9:45 PM  Result Value Ref Range Status   Specimen Description BLOOD LEFT ANTECUBITAL  Final   Special Requests BOTTLES DRAWN AEROBIC ONLY 10CC  Final   Culture NO GROWTH 5 DAYS  Final   Report Status 03/28/2016 FINAL  Final  Blood culture (routine x 2)     Status: None   Collection Time: 03/23/16  9:50 PM  Result Value Ref Range Status   Specimen Description BLOOD LEFT HAND  Final   Special Requests IN PEDIATRIC BOTTLE 3CC  Final   Culture NO GROWTH 5 DAYS  Final   Report Status 03/28/2016 FINAL  Final  Surgical pcr screen     Status: None   Collection Time: 03/26/16 10:12 PM  Result Value Ref Range Status   MRSA, PCR NEGATIVE NEGATIVE Final   Staphylococcus aureus NEGATIVE NEGATIVE Final    Comment:        The Xpert SA Assay (FDA approved for NASAL specimens in patients over 87 years of age), is one component of a comprehensive surveillance program.  Test performance has been validated by [facility] for patients greater than or equal to 48 year old. It is not intended to diagnose infection nor to guide or monitor treatment.   Anaerobic culture     Status: None (Preliminary result)   Collection Time: 03/27/16 10:45 AM  Result Value Ref Range Status   Specimen Description TISSUE  Final   Special Requests CLEAN FIRST METATARSAL LEFT RAY SWABS SPECIMEN A  Final   Gram Stain   Final    NO WBC SEEN NO ORGANISMS SEEN Performed at [facility]    Culture PENDING  Incomplete   Report Status PENDING  Incomplete  Tissue culture     Status: None (Preliminary result)   Collection Time: 03/27/16 10:45 AM  Result Value Ref Range Status   Specimen Description TISSUE  Final   Special Requests CLEAN FIRST METATARSAL LEFT RAY SWABS  SPECIMEN A  Final   Gram Stain   Final    NO WBC SEEN NO ORGANISMS SEEN Performed at [facility]    Culture   Final    NO GROWTH 1 DAY Performed at [facility]    Report Status PENDING  Incomplete     Labs: Basic Metabolic Panel:  Recent Labs Lab 03/23/16 2304 03/26/16 0622 03/28/16 0409 03/29/16 0712  NA 136 137 139 136  K 4.2 4.3 4.9 4.7  CL 97* 94* 97* 94*  CO2 28 32 34* 35*  GLUCOSE 109* 220* 98 180*  BUN 38* 30* 23* 24*  CREATININE 2.20* 2.14* 1.84* 1.77*  CALCIUM 8.9 8.5* 8.3* 8.5*   Liver Function Tests: No results for input(s): AST, ALT, ALKPHOS, BILITOT, PROT, ALBUMIN in the last 168 hours. No results for input(s): LIPASE, AMYLASE in the last 168 hours. No results for input(s): AMMONIA in the last 168 hours. CBC:  Recent Labs Lab 03/23/16 2304 03/27/16 0420 03/28/16 0409  WBC 9.8 9.9 11.0*  NEUTROABS 6.9  --   --   HGB 12.9* 10.7* 10.9*  HCT 40.1 33.7* 35.0*  MCV 98.0 98.3 100.0  PLT 251 340 326   Cardiac Enzymes: No results for input(s): CKTOTAL, CKMB, CKMBINDEX, TROPONINI in the last 168 hours. BNP: BNP (last 3 results)  Recent Labs  03/27/16 2102  BNP 125.3*    ProBNP (last 3 results) No results for input(s): PROBNP in the last 8760 hours.  CBG:  Recent Labs Lab 03/28/16 0808 03/28/16 1154 03/28/16 1711 03/28/16 2125 03/29/16 0750  GLUCAP 87 195* 207* 214* 163*       Signed:  Eleonore Chiquito S MD.  [facility] 03/29/2016, 11:26 AM

## 2016-03-29 NOTE — Progress Notes (Signed)
Patient concerned about increased urinary frequency and denies pain with urination. MD notified and orders received.

## 2016-03-29 NOTE — Care Management Note (Addendum)
Case Management Note  Patient Details  Name: [name] Small MRN: [id number] Date of Birth: May 14, 1928  Subjective/Objective:      Admitted with diabetic L foot ulcer. Hx of DMII, CKD, CAD, PVD, Afib, CHF. From home with wife. PTA (sickness), pt independent with ADL's. Owns cane and uses home Oxygen. PCP: [name].    -  s/p left partial 1st ray amputation, 5/17/201   Action/Plan: Discharge to SNF/Rehab today.  Expected Discharge Date:   03/29/2016            Expected Discharge Plan:  Home/Self Care (Lives with wife [name] Small)  In-House Referral:  Clinical Social Work  Discharge planning Services  CM Consult  Post Acute Care Choice:  Resumption of Svcs/PTA Provider Choice offered to:     DME Arranged:  Oxygen (Pt owns cane) DME Agency:  [place]:    [place] Agency:     Status of Service:  Completed, signed off  Medicare Important Message Given:   Date Medicare IM Given:    Medicare IM give by:    Date Additional Medicare IM Given:    Additional Medicare Important Message give by:     If discussed at [place] of Stay Meetings, dates discussed:    Additional Comments:  Sharin Mons, [place] [phone] 03/29/2016, 10:35 AM

## 2016-03-29 NOTE — Care Management Important Message (Signed)
Important Message  Patient Details  Name: SUHEYB VERHAGEN MRN: [id number] Date of Birth: 20-May-1928   Medicare Important Message Given:  Yes    Sharin Mons, RN 03/29/2016, 12:00 PM

## 2016-03-29 NOTE — Progress Notes (Signed)
[name] to be D/Small'd Skilled nursing facility per MD order.  Discussed with the patient and all questions fully answered.  VSS, Dressing clean dry and intact. JP drain charged in intact. IV catheter discontinued intact. Site without signs and symptoms of complications. Dressing and pressure applied.  An After Visit Summary was printed and given to the patient. Patient received prescription.  D/Small education completed with patient/family including follow up instructions, medication list, d/Small activities limitations if indicated, with other d/Small instructions as indicated by MD - patient able to verbalize understanding, all questions fully answered.   Patient instructed to return to ED, call 911, or call MD for any changes in condition.   Patient escorted via [place], and D/Small home via private auto.   11:58 AM Report call to [place] and given to [facility], [name] Small 03/29/2016 11:58 AM

## 2016-03-30 LAB — TISSUE CULTURE
Culture: NO GROWTH
Gram Stain: NONE SEEN

## 2016-03-31 LAB — ANAEROBIC CULTURE: Gram Stain: NONE SEEN

## 2016-04-02 ENCOUNTER — Ambulatory Visit (INDEPENDENT_AMBULATORY_CARE_PROVIDER_SITE_OTHER): Payer: Medicare Other | Admitting: Sports Medicine

## 2016-04-02 ENCOUNTER — Encounter: Payer: Self-pay | Admitting: Sports Medicine

## 2016-04-02 ENCOUNTER — Ambulatory Visit (INDEPENDENT_AMBULATORY_CARE_PROVIDER_SITE_OTHER): Payer: Medicare Other

## 2016-04-02 DIAGNOSIS — E1140 Type 2 diabetes mellitus with diabetic neuropathy, unspecified: Secondary | ICD-10-CM

## 2016-04-02 DIAGNOSIS — Z9889 Other specified postprocedural states: Secondary | ICD-10-CM

## 2016-04-02 DIAGNOSIS — M79672 Pain in left foot: Secondary | ICD-10-CM

## 2016-04-02 DIAGNOSIS — I739 Peripheral vascular disease, unspecified: Secondary | ICD-10-CM

## 2016-04-02 NOTE — Progress Notes (Signed)
Patient ID: [name] Small, male   DOB: 02-22-1928, 82 y.o.   MRN: [id number] Subjective: [name] Small is a 82 y.o. male patient seen today in office for POV #1  (DOS 03-27-16), S/P Partial 1st ray amputation with placement of drain. Patient denies pain at surgical site, denies calf pain, denies headache, chest pain, shortness of breath, nausea, vomiting, fever, or chills. Patient is currently on Augmentin with no problems. Patient is at [facility] rehab. No other issues noted.   FBS: unknown   Patient is assisted by family member this visit.   Patient Active Problem List   Diagnosis Date Noted  . Diabetic ulcer of left foot ([place]) 03/24/2016  . Diabetic ulcer of left foot associated with diabetes mellitus due to underlying condition ([place])   . Weakness 03/18/2016  . Mood disorder ([place]) 03/18/2016  . Groin hematoma   . Chronic renal failure   . Critical lower limb ischemia 02/28/2016  . Peripheral arterial disease ([place]) 10/17/2015  . Edema 07/19/2015  . Claudication ([place]) 01/31/2015  . DOE (dyspnea on exertion) 01/31/2015  . Hyperlipidemia 01/31/2015  . HTN (hypertension) 01/31/2015  . Chronic diastolic CHF (congestive heart failure) ([place]) 01/21/2014  . Seborrheic keratoses 04/30/2013  . CHF (congestive heart failure) ([place]) 12/16/2012  . Atrial fibrillation ([place]) 12/16/2012  . Hyperkalemia 11/27/2011  . Bladder cancer ([place]) 09/22/2011  . Leg cramps 08/28/2011  . Cough 05/26/2011  . CAD (coronary artery disease) of artery bypass graft 05/09/2011  . Dyspnea on exertion 04/19/2011  . PSORIASIS, SCALP 10/25/2008  . Chronic kidney disease, stage III (moderate) 12/04/2007  . Diabetes with neurologic complications ([place]) 84/16/6063  . DIVERTICULOSIS, COLON 05/21/2007  . BENIGN PROSTATIC HYPERTROPHY 05/21/2007    Current Outpatient Prescriptions on File Prior to Visit  Medication Sig Dispense Refill  . acetaminophen (TYLENOL) 500 MG tablet Take 1,000 mg by mouth every 8 (eight) hours as  needed for mild pain, moderate pain or headache.    Marland Kitchen amLODipine (NORVASC) 5 MG tablet Take 1 tablet (5 mg total) by mouth daily.    Marland Kitchen amoxicillin-clavulanate (AUGMENTIN) 875-125 MG tablet Take 1 tablet by mouth 2 (two) times daily. 28 tablet 0  . atorvastatin (LIPITOR) 10 MG tablet Take 1 tablet (10 mg total) by mouth daily. 90 tablet 3  . budesonide-formoterol (SYMBICORT) 160-4.5 MCG/ACT inhaler Inhale 2 puffs into the lungs 2 (two) times daily as needed (shortness of breath).     . clobetasol (TEMOVATE) 0.05 % external solution Apply 1 application topically daily.    . clopidogrel (PLAVIX) 75 MG tablet Take 1 tablet by mouth  daily 90 tablet 1  . collagenase (SANTYL) ointment Apply 1 application topically daily.    . furosemide (LASIX) 40 MG tablet TAKE 1 TABLET BY MOUTH  EVERY MONDAY, WEDNESDAY,  AND FRIDAY 45 tablet 1  . gabapentin (NEURONTIN) 100 MG capsule Take 100 mg by mouth 3 (three) times daily.    Marland Kitchen glipiZIDE (GLUCOTROL XL) 5 MG 24 hr tablet Take 1 tablet by mouth  daily with breakfast 90 tablet 1  . insulin aspart (NOVOLOG) 100 UNIT/ML injection Inject 0-9 Units into the skin 3 (three) times daily with meals. 10 mL 11  . insulin glargine (LANTUS) 100 UNIT/ML injection Inject 25 Units into the skin daily.     Marland Kitchen ipratropium-albuterol (DUONEB) 0.5-2.5 (3) MG/3ML SOLN Take 3 mLs by nebulization every 6 (six) hours as needed. 360 mL 0  . oxyCODONE (OXY IR/ROXICODONE) 5 MG immediate release tablet Take 1 tablet (5  mg total) by mouth every 6 (six) hours as needed for severe pain. 30 tablet 0  . polyethylene glycol (MIRALAX / GLYCOLAX) packet Take 17 g by mouth daily as needed. 14 each 0   No current facility-administered medications on file prior to visit.    Allergies  Allergen Reactions  . Actos [Pioglitazone Hydrochloride] Other (See Comments)    Held 2012 bladder cancer  . Aspirin Other (See Comments)    Held 2012 due to hematuria and bruising.   . Ace Inhibitors Cough  .  Gabapentin     Upset stomach and diarrhea - tolerates low doses  . Lyrica [Pregabalin] Other (See Comments)    swelling  . Pravastatin Other (See Comments)    Swelling of feet  . Bactrim Rash and Other (See Comments)    Rash, presumed allergy  . Sulfa Drugs Cross Reactors Rash    Objective: There were no vitals filed for this visit.  General: No acute distress, AAOx3  Left foot: Drain <10cc of bloody drainage intact to surgical site. Sutures intact with no gapping or dehiscence at surgical site, mild swelling to left foot, no erythema, no warmth, no drainage, no acute signs of infection noted, Capillary fill time <3 seconds in all remaining digits, gross sensation present via light touch to left foot. No pain or crepitation with range of motion left foot.  No pain with calf compression.   Post Op Xray, Left foot: Remaining 1st met base intact with no bony erosions or fracture. Drain intact. No soft tissue emphysema. Soft tissue swelling within normal limits for post op status. No other acute findings.   Intra-op tissue cultures show no growth  Assessment and Plan:  Problem List Items Addressed This Visit      Endocrine   Diabetes with neurologic complications ([place])    Other Visit Diagnoses    Left foot pain    -  Primary    Relevant Orders    DG Foot Complete Left    S/P foot surgery, left        Partial 1st ray amputation with I&D, tissue and bone culture with placement of Drain    PVD (peripheral vascular disease) ([place])          -Patient seen and evaluated -Xrays reviewed -Removed drain and applied dry sterile dressing to surgical site left foot secured with ACE wrap and stockinet  -Advised patient to make sure to keep dressings clean, dry, and intact to left surgical site  -Continue with Augmentin as Rx  -Advised patient to continue with CAM boot and NWB to left foot -Advised patient to elevate as necessary  -Cont with [facility] rehab placement -Patient to follow up with  Dr. Jacqualyn Posey in 1 week for post op care. In the meantime, patient to call office if any issues or problems arise.   Landis Martins, DPM

## 2016-04-05 ENCOUNTER — Encounter: Payer: Self-pay | Admitting: Podiatry

## 2016-04-07 DIAGNOSIS — R5381 Other malaise: Secondary | ICD-10-CM | POA: Diagnosis not present

## 2016-04-07 DIAGNOSIS — I5042 Chronic combined systolic (congestive) and diastolic (congestive) heart failure: Secondary | ICD-10-CM | POA: Diagnosis not present

## 2016-04-07 DIAGNOSIS — J449 Chronic obstructive pulmonary disease, unspecified: Secondary | ICD-10-CM | POA: Diagnosis not present

## 2016-04-07 DIAGNOSIS — N183 Chronic kidney disease, stage 3 (moderate): Secondary | ICD-10-CM | POA: Diagnosis not present

## 2016-04-07 DIAGNOSIS — E1151 Type 2 diabetes mellitus with diabetic peripheral angiopathy without gangrene: Secondary | ICD-10-CM | POA: Diagnosis not present

## 2016-04-10 ENCOUNTER — Encounter: Payer: Self-pay | Admitting: Podiatry

## 2016-04-10 ENCOUNTER — Ambulatory Visit (INDEPENDENT_AMBULATORY_CARE_PROVIDER_SITE_OTHER): Payer: Medicare Other | Admitting: Podiatry

## 2016-04-10 DIAGNOSIS — Z9889 Other specified postprocedural states: Secondary | ICD-10-CM

## 2016-04-12 ENCOUNTER — Telehealth: Payer: Self-pay | Admitting: *Deleted

## 2016-04-12 NOTE — Telephone Encounter (Addendum)
[name] Small [facility] states received call from [facility] in [place], requesting PT for pt due to amputation of toe, will Dr. Jacqualyn Posey sign orders for this.  04/15/2016-Informed [name] Small [facility] that Dr. Jacqualyn Posey stated he would sign PT orders for pt, and I gave my fax number.

## 2016-04-14 NOTE — Progress Notes (Signed)
Patient ID: [name] Small, male   DOB: 06-18-1928, 82 y.o.   MRN: [id number]  Subjective: [name] Small is a 82 y.o. is seen today in office s/p left partial 1st ray amputation. He states that overall he is doing well he has not had any pain. He is still in the acute rehabilitation facility. He has continued on Augmentin and will finish this week. Denies any systemic complaints such as fevers, chills, nausea, vomiting. No calf pain, chest pain, shortness of breath.   Objective: General: No acute distress, AAOx3  DP/PT pulses palpable 2/4, CRT < 3 sec to all digits.  Motor function intact.  Left foot: Incision is well coapted without any evidence of dehiscence and sutures intact. There is no surrounding erythema, ascending cellulitis, fluctuance, crepitus, malodor, drainage/purulence. There is minimal edema around the surgical site. There is no pain along the surgical site.  No other areas of tenderness to bilateral lower extremities.  No other open lesions or pre-ulcerative lesions.  No pain with calf compression, swelling, warmth, erythema.   Assessment and Plan:  Status post left partial 1st ray amputation, doing well with no complications   -Treatment options discussed including all alternatives, risks, and complications -Will of the sutures in for 1 more week. Antibiotic ointment and a dressing was applied. -Ice/elevation -Pain medication as needed. -Start partial weightbearing as tolerated in the cam boot must wear the cam boot at all times. He lives in a one-story house so if physical therapy believes that he is safe to go home he can do this. -Monitor for any clinical signs or symptoms of infection and DVT/PE and directed to call the office immediately should any occur or go to the ER. -Follow-up in 1 week for suture removal or sooner if any problems arise. In the meantime, encouraged to call the office with any questions, concerns, change in symptoms.   Pathology: Clean margin:  no osteomyelitis or malignancy Left great toe: ulcer with inflammation and necrosis with osteomyelitis  Culture: Clean margin: No anaerobes; no growth x 2 days  Celesta Gentile, DPM

## 2016-04-14 NOTE — Telephone Encounter (Signed)
Yes, I will sign PT orders.

## 2016-04-15 ENCOUNTER — Telehealth: Payer: Self-pay

## 2016-04-15 NOTE — Telephone Encounter (Signed)
Tillie Rung with [facility] left v/m requesting verbal orders for home health for pt; Tillie Rung said had requested on 04/12/16; 04/12/16 request went to Dr Maren Beach who did agree to order [facility] PT. I spoke with Butch Penny at [facility] due to [place] not being available and Butch Penny thinks PT orders is all that was needed. If anything further needed Tillie Rung will cb.

## 2016-04-16 ENCOUNTER — Telehealth: Payer: Self-pay | Admitting: *Deleted

## 2016-04-16 DIAGNOSIS — Z4781 Encounter for orthopedic aftercare following surgical amputation: Secondary | ICD-10-CM | POA: Diagnosis not present

## 2016-04-16 DIAGNOSIS — Z87891 Personal history of nicotine dependence: Secondary | ICD-10-CM | POA: Diagnosis not present

## 2016-04-16 DIAGNOSIS — E1122 Type 2 diabetes mellitus with diabetic chronic kidney disease: Secondary | ICD-10-CM | POA: Diagnosis not present

## 2016-04-16 DIAGNOSIS — Z794 Long term (current) use of insulin: Secondary | ICD-10-CM | POA: Diagnosis not present

## 2016-04-16 DIAGNOSIS — I130 Hypertensive heart and chronic kidney disease with heart failure and stage 1 through stage 4 chronic kidney disease, or unspecified chronic kidney disease: Secondary | ICD-10-CM | POA: Diagnosis not present

## 2016-04-16 DIAGNOSIS — E1151 Type 2 diabetes mellitus with diabetic peripheral angiopathy without gangrene: Secondary | ICD-10-CM | POA: Diagnosis not present

## 2016-04-16 DIAGNOSIS — Z8551 Personal history of malignant neoplasm of bladder: Secondary | ICD-10-CM | POA: Diagnosis not present

## 2016-04-16 DIAGNOSIS — N183 Chronic kidney disease, stage 3 (moderate): Secondary | ICD-10-CM | POA: Diagnosis not present

## 2016-04-16 DIAGNOSIS — Z89412 Acquired absence of left great toe: Secondary | ICD-10-CM | POA: Diagnosis not present

## 2016-04-16 DIAGNOSIS — I4891 Unspecified atrial fibrillation: Secondary | ICD-10-CM | POA: Diagnosis not present

## 2016-04-16 DIAGNOSIS — J449 Chronic obstructive pulmonary disease, unspecified: Secondary | ICD-10-CM | POA: Diagnosis not present

## 2016-04-16 DIAGNOSIS — I5030 Unspecified diastolic (congestive) heart failure: Secondary | ICD-10-CM | POA: Diagnosis not present

## 2016-04-16 DIAGNOSIS — I2510 Atherosclerotic heart disease of native coronary artery without angina pectoris: Secondary | ICD-10-CM | POA: Diagnosis not present

## 2016-04-16 DIAGNOSIS — Z9981 Dependence on supplemental oxygen: Secondary | ICD-10-CM | POA: Diagnosis not present

## 2016-04-16 NOTE — Telephone Encounter (Signed)
Wife is not sure who the current order for O2 is coming from.  Wife says the [facility] nurse is coming by this afternoon and she will see what she can find out from her.

## 2016-04-16 NOTE — Telephone Encounter (Signed)
Thanks.  I'll await more info.

## 2016-04-16 NOTE — Telephone Encounter (Signed)
Is the current O2 order still coming through [facility]?  If so we can ask for a bigger tank.  Let me know.  Thanks.

## 2016-04-16 NOTE — Telephone Encounter (Signed)
Was asked by Dr. Damita Dunnings to call the patient to see if he had been set up with PT/OT.  Spoke with wife.  Patient's PT is coming shortly for a session.  Wife doesn't know if OT has been set up or not but a nurse will be coming soon.  I suggested that the wife question the PT and nurse as to whether the OT had been set up and let us know if it hasn't.  Wife expressed a concern about the oxygen that they have on hand.  Wife states that the oxygen in 1 tank only lasts about an hour and it severely curtails them doing much of anything outside the home.  FU appt is scheduled next week.

## 2016-04-17 ENCOUNTER — Other Ambulatory Visit: Payer: Medicare Other

## 2016-04-19 ENCOUNTER — Ambulatory Visit (INDEPENDENT_AMBULATORY_CARE_PROVIDER_SITE_OTHER): Payer: Medicare Other | Admitting: Podiatry

## 2016-04-19 DIAGNOSIS — I5030 Unspecified diastolic (congestive) heart failure: Secondary | ICD-10-CM | POA: Diagnosis not present

## 2016-04-19 DIAGNOSIS — Z8551 Personal history of malignant neoplasm of bladder: Secondary | ICD-10-CM | POA: Diagnosis not present

## 2016-04-19 DIAGNOSIS — Z9889 Other specified postprocedural states: Secondary | ICD-10-CM

## 2016-04-19 DIAGNOSIS — B351 Tinea unguium: Secondary | ICD-10-CM

## 2016-04-19 DIAGNOSIS — E1140 Type 2 diabetes mellitus with diabetic neuropathy, unspecified: Secondary | ICD-10-CM

## 2016-04-19 DIAGNOSIS — E1122 Type 2 diabetes mellitus with diabetic chronic kidney disease: Secondary | ICD-10-CM | POA: Diagnosis not present

## 2016-04-19 DIAGNOSIS — M79675 Pain in left toe(s): Secondary | ICD-10-CM

## 2016-04-19 DIAGNOSIS — I130 Hypertensive heart and chronic kidney disease with heart failure and stage 1 through stage 4 chronic kidney disease, or unspecified chronic kidney disease: Secondary | ICD-10-CM | POA: Diagnosis not present

## 2016-04-19 DIAGNOSIS — I4891 Unspecified atrial fibrillation: Secondary | ICD-10-CM | POA: Diagnosis not present

## 2016-04-19 DIAGNOSIS — M79674 Pain in right toe(s): Secondary | ICD-10-CM

## 2016-04-19 DIAGNOSIS — Z9981 Dependence on supplemental oxygen: Secondary | ICD-10-CM | POA: Diagnosis not present

## 2016-04-19 DIAGNOSIS — J449 Chronic obstructive pulmonary disease, unspecified: Secondary | ICD-10-CM | POA: Diagnosis not present

## 2016-04-19 DIAGNOSIS — Z87891 Personal history of nicotine dependence: Secondary | ICD-10-CM | POA: Diagnosis not present

## 2016-04-19 DIAGNOSIS — I739 Peripheral vascular disease, unspecified: Secondary | ICD-10-CM

## 2016-04-19 DIAGNOSIS — I2510 Atherosclerotic heart disease of native coronary artery without angina pectoris: Secondary | ICD-10-CM | POA: Diagnosis not present

## 2016-04-19 DIAGNOSIS — E1151 Type 2 diabetes mellitus with diabetic peripheral angiopathy without gangrene: Secondary | ICD-10-CM | POA: Diagnosis not present

## 2016-04-19 DIAGNOSIS — Z4781 Encounter for orthopedic aftercare following surgical amputation: Secondary | ICD-10-CM | POA: Diagnosis not present

## 2016-04-19 DIAGNOSIS — Z794 Long term (current) use of insulin: Secondary | ICD-10-CM | POA: Diagnosis not present

## 2016-04-19 DIAGNOSIS — N183 Chronic kidney disease, stage 3 (moderate): Secondary | ICD-10-CM | POA: Diagnosis not present

## 2016-04-19 DIAGNOSIS — Z89412 Acquired absence of left great toe: Secondary | ICD-10-CM | POA: Diagnosis not present

## 2016-04-19 NOTE — Progress Notes (Signed)
Patient ID: [name] Small, male   DOB: 12/12/1927, 82 y.o.   MRN: [id number]   Subjective: [name] Small is a 82 y.o. is seen today in office s/p left partial 1st ray amputation. This is doing well having no pain. He presents today for suture removal. Also states his toes are elongated causing pain. Denies any redness or drainage. He has finished antibiotics. He is continuing the CAM boot. Denies any systemic complaints such as fevers, chills, nausea, vomiting. No calf pain, chest pain, shortness of breath.   Objective: General: No acute distress, AAOx3  DP/PT pulses palpable 2/4, CRT < 3 sec to all digits.  Motor function intact.  Left foot: Incision is well coapted without any evidence of dehiscence and sutures intact. There is no surrounding erythema, ascending cellulitis, fluctuance, crepitus, malodor, drainage/purulence. There is minimal edema around the surgical site. There is no pain along the surgical site.  Nails hypertrophic, dystrophic, brittle, discolored, elongated 9. No swelling redness or drainage. Tenderness to nails 1-5 bilaterally except for the left first toe which is been amputated. No other areas of tenderness to bilateral lower extremities.  No other open lesions or pre-ulcerative lesions.  No pain with calf compression, swelling, warmth, erythema.   Assessment and Plan:  Status post left partial 1st ray amputation, doing well with no complications ; symptomatic onychomycosis  -Treatment options discussed including all alternatives, risks, and complications -Nails debrided 10 without complications or bleeding -Half of the sutures removed today. SomeThe incision so we'll hold off on removing the rest of until next week. Antibiotic ointment was applied followed by dressing. Continue in cam boot. He is requesting a small boot today in a short cam boot was dispensed. -Monitor for any clinical signs or symptoms of infection and directed to call the office immediately  should any occur or go to the ER. -Follow-up in 1 week or sooner if any problems arise. In the meantime, encouraged to call the office with any questions, concerns, change in symptoms.   Celesta Gentile, DPM

## 2016-04-22 DIAGNOSIS — I4891 Unspecified atrial fibrillation: Secondary | ICD-10-CM | POA: Diagnosis not present

## 2016-04-22 DIAGNOSIS — Z9981 Dependence on supplemental oxygen: Secondary | ICD-10-CM | POA: Diagnosis not present

## 2016-04-22 DIAGNOSIS — E1151 Type 2 diabetes mellitus with diabetic peripheral angiopathy without gangrene: Secondary | ICD-10-CM | POA: Diagnosis not present

## 2016-04-22 DIAGNOSIS — Z8551 Personal history of malignant neoplasm of bladder: Secondary | ICD-10-CM | POA: Diagnosis not present

## 2016-04-22 DIAGNOSIS — Z87891 Personal history of nicotine dependence: Secondary | ICD-10-CM | POA: Diagnosis not present

## 2016-04-22 DIAGNOSIS — I5030 Unspecified diastolic (congestive) heart failure: Secondary | ICD-10-CM | POA: Diagnosis not present

## 2016-04-22 DIAGNOSIS — I2510 Atherosclerotic heart disease of native coronary artery without angina pectoris: Secondary | ICD-10-CM | POA: Diagnosis not present

## 2016-04-22 DIAGNOSIS — Z4781 Encounter for orthopedic aftercare following surgical amputation: Secondary | ICD-10-CM | POA: Diagnosis not present

## 2016-04-22 DIAGNOSIS — Z89412 Acquired absence of left great toe: Secondary | ICD-10-CM | POA: Diagnosis not present

## 2016-04-22 DIAGNOSIS — J449 Chronic obstructive pulmonary disease, unspecified: Secondary | ICD-10-CM | POA: Diagnosis not present

## 2016-04-22 DIAGNOSIS — Z794 Long term (current) use of insulin: Secondary | ICD-10-CM | POA: Diagnosis not present

## 2016-04-22 DIAGNOSIS — I130 Hypertensive heart and chronic kidney disease with heart failure and stage 1 through stage 4 chronic kidney disease, or unspecified chronic kidney disease: Secondary | ICD-10-CM | POA: Diagnosis not present

## 2016-04-22 DIAGNOSIS — N183 Chronic kidney disease, stage 3 (moderate): Secondary | ICD-10-CM | POA: Diagnosis not present

## 2016-04-22 DIAGNOSIS — E1122 Type 2 diabetes mellitus with diabetic chronic kidney disease: Secondary | ICD-10-CM | POA: Diagnosis not present

## 2016-04-23 ENCOUNTER — Encounter: Payer: Self-pay | Admitting: Family Medicine

## 2016-04-23 ENCOUNTER — Ambulatory Visit (INDEPENDENT_AMBULATORY_CARE_PROVIDER_SITE_OTHER): Payer: Medicare Other | Admitting: Family Medicine

## 2016-04-23 VITALS — BP 118/58 | HR 68 | Temp 97.8°F | Wt 204.0 lb

## 2016-04-23 DIAGNOSIS — Z794 Long term (current) use of insulin: Secondary | ICD-10-CM | POA: Diagnosis not present

## 2016-04-23 DIAGNOSIS — L97529 Non-pressure chronic ulcer of other part of left foot with unspecified severity: Secondary | ICD-10-CM

## 2016-04-23 DIAGNOSIS — E08621 Diabetes mellitus due to underlying condition with foot ulcer: Secondary | ICD-10-CM | POA: Diagnosis not present

## 2016-04-23 DIAGNOSIS — I509 Heart failure, unspecified: Secondary | ICD-10-CM

## 2016-04-23 DIAGNOSIS — E118 Type 2 diabetes mellitus with unspecified complications: Secondary | ICD-10-CM

## 2016-04-23 LAB — BASIC METABOLIC PANEL
BUN: 39 mg/dL — ABNORMAL HIGH (ref 6–23)
CO2: 34 mEq/L — ABNORMAL HIGH (ref 19–32)
Calcium: 8.9 mg/dL (ref 8.4–10.5)
Chloride: 100 mEq/L (ref 96–112)
Creatinine, Ser: 1.72 mg/dL — ABNORMAL HIGH (ref 0.40–1.50)
GFR: 40.32 mL/min — ABNORMAL LOW (ref >60.00)
Glucose, Bld: 132 mg/dL — ABNORMAL HIGH (ref 70–99)
Potassium: 5.3 mEq/L — ABNORMAL HIGH (ref 3.5–5.1)
Sodium: 140 mEq/L (ref 135–145)

## 2016-04-23 MED ORDER — INSULIN GLARGINE 100 UNIT/ML ~~LOC~~ SOLN: 10.0000 [IU] | Freq: Every day | SUBCUTANEOUS | Status: DC

## 2016-04-23 NOTE — Progress Notes (Signed)
Pre visit review using our clinic review tool, if applicable. No additional management support is needed unless otherwise documented below in the visit note.  F/u for L foot ray amputation.   Has f/u with foot clinic pending.  Still booted and using walker.   Prev with a rash on the legs, resolved now.   Off flomax now but was on recently.  Would be okay to restart, d/w pt.  Was on spiriva until a few days ago, okay to restart, likely not causing the rash, d/w pt.    No pain in the L foot.   He has O2 need with inability to use current tank for more than 1 hour.  Needs an order for concentrator.  I'll check on this.  D/w pt.    Med list clarified.  He had been using SSI insulin dosing but using lantus instead of short acting insulin.  Wasn't taking lantus qhs.  D/w pt.  See plan.  Weight loss noted.    Meds, vitals, and allergies reviewed.   ROS: Per HPI unless specifically indicated in ROS section   GEN: nad, alert and oriented HEENT: mucous membranes moist NECK: supple w/o LA CV: rrr PULM: ctab, no inc wob ABD: soft, +bs EXT: no edema SKIN: no acute rash L foot booted.

## 2016-04-23 NOTE — Patient Instructions (Signed)
Go to the lab on the way out.  We'll contact you with your lab report. Cut back to 1 shot of lantus a day.  I would presume you'll need about 10 units a day.  You can add 1 unit a day if your AM sugar is above 150.   If you have a low sugar, then cut back 1 unit a day.  Take care.  Glad to see you.  Update me as needed.  I want to see you back in about 2 months, sooner if needed.

## 2016-04-24 DIAGNOSIS — Z89412 Acquired absence of left great toe: Secondary | ICD-10-CM | POA: Diagnosis not present

## 2016-04-24 DIAGNOSIS — Z4781 Encounter for orthopedic aftercare following surgical amputation: Secondary | ICD-10-CM | POA: Diagnosis not present

## 2016-04-24 DIAGNOSIS — Z8551 Personal history of malignant neoplasm of bladder: Secondary | ICD-10-CM | POA: Diagnosis not present

## 2016-04-24 DIAGNOSIS — Z87891 Personal history of nicotine dependence: Secondary | ICD-10-CM | POA: Diagnosis not present

## 2016-04-24 DIAGNOSIS — Z9981 Dependence on supplemental oxygen: Secondary | ICD-10-CM | POA: Diagnosis not present

## 2016-04-24 DIAGNOSIS — I130 Hypertensive heart and chronic kidney disease with heart failure and stage 1 through stage 4 chronic kidney disease, or unspecified chronic kidney disease: Secondary | ICD-10-CM | POA: Diagnosis not present

## 2016-04-24 DIAGNOSIS — N183 Chronic kidney disease, stage 3 (moderate): Secondary | ICD-10-CM | POA: Diagnosis not present

## 2016-04-24 DIAGNOSIS — I5030 Unspecified diastolic (congestive) heart failure: Secondary | ICD-10-CM | POA: Diagnosis not present

## 2016-04-24 DIAGNOSIS — E1151 Type 2 diabetes mellitus with diabetic peripheral angiopathy without gangrene: Secondary | ICD-10-CM | POA: Diagnosis not present

## 2016-04-24 DIAGNOSIS — E1122 Type 2 diabetes mellitus with diabetic chronic kidney disease: Secondary | ICD-10-CM | POA: Diagnosis not present

## 2016-04-24 DIAGNOSIS — I4891 Unspecified atrial fibrillation: Secondary | ICD-10-CM | POA: Diagnosis not present

## 2016-04-24 DIAGNOSIS — Z794 Long term (current) use of insulin: Secondary | ICD-10-CM | POA: Diagnosis not present

## 2016-04-24 DIAGNOSIS — I2510 Atherosclerotic heart disease of native coronary artery without angina pectoris: Secondary | ICD-10-CM | POA: Diagnosis not present

## 2016-04-24 DIAGNOSIS — J449 Chronic obstructive pulmonary disease, unspecified: Secondary | ICD-10-CM | POA: Diagnosis not present

## 2016-04-24 NOTE — Assessment & Plan Note (Signed)
Prev noted: Patient Saturations on Room Air at Rest = 92% Patient Saturations on Room Air while Ambulating = 87% Patient Saturations on 2 Liters of oxygen while Ambulating = 91% We'll check on getting concentrator set up.

## 2016-04-24 NOTE — Assessment & Plan Note (Signed)
Foot care per outside clinic and I appreciate help of all involved.   Med list change re: insulin.   Change back to 1 shot of lantus a day.  I would presume he'll need about 10 units a day.  He can add 1 unit a day if AM sugar is above 150.   If a low sugar, then cut back 1 unit a day.  Update me as needed.  No short acting insulin at this point.   Patient and wife understood.  See AVS.  >25 minutes spent in face to face time with patient, >50% spent in counselling or coordination of care

## 2016-04-26 ENCOUNTER — Ambulatory Visit (INDEPENDENT_AMBULATORY_CARE_PROVIDER_SITE_OTHER): Payer: Medicare Other | Admitting: Podiatry

## 2016-04-26 ENCOUNTER — Other Ambulatory Visit: Payer: Self-pay | Admitting: Family Medicine

## 2016-04-26 ENCOUNTER — Encounter: Payer: Self-pay | Admitting: Podiatry

## 2016-04-26 DIAGNOSIS — L97521 Non-pressure chronic ulcer of other part of left foot limited to breakdown of skin: Secondary | ICD-10-CM

## 2016-04-26 DIAGNOSIS — Z9889 Other specified postprocedural states: Secondary | ICD-10-CM

## 2016-04-26 DIAGNOSIS — E1140 Type 2 diabetes mellitus with diabetic neuropathy, unspecified: Secondary | ICD-10-CM

## 2016-04-30 DIAGNOSIS — Z4781 Encounter for orthopedic aftercare following surgical amputation: Secondary | ICD-10-CM | POA: Diagnosis not present

## 2016-04-30 DIAGNOSIS — Z9981 Dependence on supplemental oxygen: Secondary | ICD-10-CM | POA: Diagnosis not present

## 2016-04-30 DIAGNOSIS — Z89412 Acquired absence of left great toe: Secondary | ICD-10-CM | POA: Diagnosis not present

## 2016-04-30 DIAGNOSIS — I2510 Atherosclerotic heart disease of native coronary artery without angina pectoris: Secondary | ICD-10-CM | POA: Diagnosis not present

## 2016-04-30 DIAGNOSIS — I130 Hypertensive heart and chronic kidney disease with heart failure and stage 1 through stage 4 chronic kidney disease, or unspecified chronic kidney disease: Secondary | ICD-10-CM | POA: Diagnosis not present

## 2016-04-30 DIAGNOSIS — Z87891 Personal history of nicotine dependence: Secondary | ICD-10-CM | POA: Diagnosis not present

## 2016-04-30 DIAGNOSIS — Z794 Long term (current) use of insulin: Secondary | ICD-10-CM | POA: Diagnosis not present

## 2016-04-30 DIAGNOSIS — Z8551 Personal history of malignant neoplasm of bladder: Secondary | ICD-10-CM | POA: Diagnosis not present

## 2016-04-30 DIAGNOSIS — J449 Chronic obstructive pulmonary disease, unspecified: Secondary | ICD-10-CM | POA: Diagnosis not present

## 2016-04-30 DIAGNOSIS — I4891 Unspecified atrial fibrillation: Secondary | ICD-10-CM | POA: Diagnosis not present

## 2016-04-30 DIAGNOSIS — I5030 Unspecified diastolic (congestive) heart failure: Secondary | ICD-10-CM | POA: Diagnosis not present

## 2016-04-30 DIAGNOSIS — E1122 Type 2 diabetes mellitus with diabetic chronic kidney disease: Secondary | ICD-10-CM | POA: Diagnosis not present

## 2016-04-30 DIAGNOSIS — N183 Chronic kidney disease, stage 3 (moderate): Secondary | ICD-10-CM | POA: Diagnosis not present

## 2016-04-30 DIAGNOSIS — E1151 Type 2 diabetes mellitus with diabetic peripheral angiopathy without gangrene: Secondary | ICD-10-CM | POA: Diagnosis not present

## 2016-05-02 NOTE — Progress Notes (Signed)
Patient ID: [name] Small, male   DOB: 08-May-1928, 82 y.o.   MRN: [id number]   Subjective: [name] Small is a 82 y.o. is seen today in office s/p left partial 1st ray amputation. He presented to remove the remainder the stitches. He is remaining in the cam boot. He presented to his wife he states that he has been dressed off as much as possible. Denies any pain to the area. Denies any systemic complaints such as fevers, chills, nausea, vomiting. No calf pain, chest pain, shortness of breath.   Objective: General: No acute distress, AAOx3  DP/PT pulses palpable 2/4, CRT < 3 sec to all digits.  Motor function intact.  Left foot: Incision is well coapted without any evidence of dehiscence and remainder of sutures intact. There is no surrounding erythema, ascending cellulitis, fluctuance, crepitus, malodor, drainage/purulence. There is minimal edema around the surgical site. There is no pain along the surgical site.   Tenderness to nails 1-5 bilaterally except for the left first toe which is been amputated. No other areas of tenderness to bilateral lower extremities.  No other open lesions or pre-ulcerative lesions.  No pain with calf compression, swelling, warmth, erythema.   Assessment and Plan:  Status post left partial 1st ray amputation, doing well with no complications   -Treatment options discussed including all alternatives, risks, and complications -Remainder the sutures were removed today without complications. Incision remained coapted. Continue antibody ointment dressing changes over the incision for now. He consented to shower. Remaining cam boot. Elevation.  -Monitor for any clinical signs or symptoms of infection and directed to call the office immediately should any occur or go to the ER. -Follow-up as scheduled or sooner if any problems arise. In the meantime, encouraged to call the office with any questions, concerns, change in symptoms.   Celesta Gentile, DPM

## 2016-05-03 ENCOUNTER — Encounter: Payer: Self-pay | Admitting: Family Medicine

## 2016-05-03 ENCOUNTER — Ambulatory Visit (INDEPENDENT_AMBULATORY_CARE_PROVIDER_SITE_OTHER): Payer: Medicare Other | Admitting: Family Medicine

## 2016-05-03 VITALS — BP 124/70 | HR 72 | Temp 97.5°F | Wt 210.2 lb

## 2016-05-03 DIAGNOSIS — J189 Pneumonia, unspecified organism: Secondary | ICD-10-CM | POA: Diagnosis not present

## 2016-05-03 MED ORDER — AZITHROMYCIN 250 MG PO TABS: ORAL_TABLET | ORAL | Status: DC

## 2016-05-03 NOTE — Progress Notes (Signed)
Dr. Frederico Hamman T. Lahna Nath, MD, [facility] [place], [address] Phone: [phone] Fax: [phone]  05/03/2016  Patient: [name] Small, MRN: [id number], DOB: [birth year], 82 y.o.  Primary Physician:  [name], MD   Chief Complaint  Patient presents with  . Nasal Congestion   Subjective:   [name] Small is a 82 y.o. very pleasant male patient who presents with the following:  Some congestion in nose and chest for 1 week.  No fever Eating and drinking.  Pleasant gentleman that has COPD that is well controlled who presents with worsening cold symptoms that have progressed and moved into his chest.  He has not noticed any wheezing, nor has his wife.  He is not using his inhalers any significantly more than he typically would, but he is having some mild shortness of breath, and he is having significant cough with production of sputum.  He is currently afebrile.  His last echocardiogram showed an EF of 50-55% with only grade 1 diastolic dysfunction.  Past Medical History, Surgical History, Social History, Family History, Problem List, Medications, and Allergies have been reviewed and updated if relevant.  Patient Active Problem List   Diagnosis Date Noted  . Diabetic ulcer of left foot ([place]) 03/24/2016  . Diabetic ulcer of left foot associated with diabetes mellitus due to underlying condition ([place])   . Weakness 03/18/2016  . Mood disorder ([place]) 03/18/2016  . Groin hematoma   . Chronic renal failure   . Critical lower limb ischemia 02/28/2016  . Peripheral arterial disease ([place]) 10/17/2015  . Edema 07/19/2015  . Claudication ([place]) 01/31/2015  . DOE (dyspnea on exertion) 01/31/2015  . Hyperlipidemia 01/31/2015  . HTN (hypertension) 01/31/2015  . Chronic diastolic CHF (congestive heart failure) ([place]) 01/21/2014  . Seborrheic keratoses 04/30/2013  . CHF (congestive heart failure) ([place]) 12/16/2012  . Atrial  fibrillation ([place]) 12/16/2012  . Hyperkalemia 11/27/2011  . Bladder cancer ([place]) 09/22/2011  . Leg cramps 08/28/2011  . Cough 05/26/2011  . CAD (coronary artery disease) of artery bypass graft 05/09/2011  . Dyspnea on exertion 04/19/2011  . PSORIASIS, SCALP 10/25/2008  . Chronic kidney disease, stage III (moderate) 12/04/2007  . Diabetes with neurologic complications ([place]) 99991111  . DIVERTICULOSIS, COLON 05/21/2007  . BENIGN PROSTATIC HYPERTROPHY 05/21/2007    Past Medical History  Diagnosis Date  . BENIGN PROSTATIC HYPERTROPHY 05/21/2007  . DIVERTICULOSIS, COLON 05/21/2007    pt denies  . HYPERTENSION 05/21/2007  . PSORIASIS, SCALP 10/25/2008  . Cellulitis of left foot     Hospitalized in 2006  . Gross hematuria     pt denies  . Psoriasis ELBOWS  . Fatigue AGE-RELATED  . CAD (coronary artery disease) CARDIOLOGIST- DR WALL - VISIT IN JUNE 2012 FOR CATH    nonobstructive by cath 6/12:  mid LAD 30%, proximal obtuse marginal-2 30%, proximal RCA 20%, mid RCA 30-40%.  He had normal cardiac output and mildly elevated filling pressures but no significant pulmonary hypertension;   Echocardiogram in May 2012 demonstrated EF 50-55% and left atrial enlargement   . Impaired hearing BILATERAL HEARING AIDS  . Back pain     s/p lumbar injection 2014  . Chronic kidney disease (CKD), stage III (moderate) 12/04/2007    FOLLOWED BY DR PATEL  . PAD (peripheral artery disease) ([place])   . On home oxygen therapy     "2L at nighttime" (02/28/2016)  . DIABETES MELLITUS, TYPE II 05/21/2007  . Arthritis   .  Chronic pain of lower extremity   . Bladder cancer ([place]) 10/2011  . CHF (congestive heart failure) ([place])     per family, issues in 2012    Past Surgical History  Procedure Laterality Date  . Vasectomy  1990s  . Penile prosthesis implant  1990s  . Cardiovascular stress test  2007  . Cardiac catheterization  04-24-11/  DR MUHAMMAD ARIDA    MILD NONOBSTRUCTIVE CAD, NORMA CARDIAC OUTPUT  .  Appendectomy  1941  . Tonsillectomy and adenoidectomy  1962  . Cataract extraction w/ intraocular lens  implant, bilateral Bilateral ~ 2010  . Incision and drainage foot Left ~ 2005    LEFT FOOT DUE TO INFECTION FROM  NAIL PUNCTURE INJURY  . Cystoscopy w/ retrogrades  10/30/2011    Procedure: CYSTOSCOPY WITH RETROGRADE PYELOGRAM;  Surgeon: Molli Hazard, MD;  Location: [facility];  Service: Urology;  Laterality: Bilateral;  CYSTOSCOPY POSS TURBT BILATERAL RETROGRADE PYLEOGRAM   . Transurethral resection of bladder tumor  10/30/2011    Procedure: TRANSURETHRAL RESECTION OF BLADDER TUMOR (TURBT);  Surgeon: Molli Hazard, MD;  Location: [facility];  Service: Urology;  Laterality: N/A;  . Cystoscopy  12/25/2011    Procedure: CYSTOSCOPY;  Surgeon: Molli Hazard, MD;  Location: [facility];  Service: Urology;  Laterality: N/A;  needs intubation and to be paralyzed   . Transurethral resection of bladder tumor  12/25/2011    Procedure: TRANSURETHRAL RESECTION OF BLADDER TUMOR (TURBT);  Surgeon: Molli Hazard, MD;  Location[facility];  Service: Urology;  Laterality: N/A;  need long gyrus instruments   . Cystoscopy w/ retrogrades  11/30/2012    Procedure: CYSTOSCOPY WITH RETROGRADE PYELOGRAM;  Surgeon: Molli Hazard, MD;  Location: [facility];  Service: Urology;  Laterality: Bilateral;  Flexible cystoscopy.  . Cystoscopy with biopsy  11/30/2012    Procedure: CYSTOSCOPY WITH BIOPSY;  Surgeon: Molli Hazard, MD;  Location: [facility];  Service: Urology;  Laterality: N/A;  . Peripheral vascular catheterization N/A 02/14/2016    Procedure: Abdominal Aortogram w/Lower Extremity;  Surgeon: Wellington Hampshire, MD;  Location: [facility];  Service: Cardiovascular;  Laterality: N/A;  . Peripheral vascular catheterization Left 02/28/2016    Procedure: Peripheral Vascular  Balloon Angioplasty;  Surgeon: Wellington Hampshire, MD;  Location: [facility];  Service: Cardiovascular;  Laterality: Left;  left peroneal and popliteal artery  . Amputation Left 03/27/2016    Procedure: partial first AMPUTATION RAY; LEFT;  Surgeon: Trula Slade, DPM;  Location: [facility];  Service: Podiatry;  Laterality: Left;    Social History   Social History  . Marital Status: Married    Spouse Name: N/A  . Number of Children: 4  . Years of Education: N/A   Occupational History  . retired      [employer]	[place].   Social History Main Topics  . Smoking status: Former Smoker -- 2.00 packs/day for 22 years    Types: Cigarettes    Quit date: 11/11/1968  . Smokeless tobacco: Never Used  . Alcohol Use: 1.2 oz/week    2 Glasses of wine per week     Comment: occassional  . Drug Use: No  . Sexual Activity: Not Currently   Other Topics Concern  . Not on file   Social History Narrative   Retired Event organiser.   Active with golf.   [place] vet.  [place] [phone][place]  Family History  Problem Relation Age of Onset  . Diabetes Mother   . Heart disease Brother   . Heart disease Brother   . Heart disease Brother   . Heart disease Brother   . Heart disease Brother   . Heart disease Brother   . Lung cancer Brother     Allergies  Allergen Reactions  . Actos [Pioglitazone Hydrochloride] Other (See Comments)    Held 2012 bladder cancer  . Aspirin Other (See Comments)    Held 2012 due to hematuria and bruising.   . Ace Inhibitors Cough  . Gabapentin     Upset stomach and diarrhea - tolerates low doses  . Lyrica [Pregabalin] Other (See Comments)    swelling  . Pravastatin Other (See Comments)    Swelling of feet  . Bactrim Rash and Other (See Comments)    Rash, presumed allergy  . Sulfa Drugs Cross Reactors Rash    Medication list reviewed and updated in full in [facility].  ROS: GEN: Acute illness details above GI: Tolerating PO intake GU:  maintaining adequate hydration and urination Pulm: No SOB Interactive and getting along well at home.  Otherwise, ROS is as per the HPI.  Objective:   BP 124/70 mmHg  Pulse 72  Temp(Src) 97.5 F (36.4 C) (Oral)  Wt 210 lb 4 oz (95.369 kg)  SpO2 93%   GEN: A and O x 3. WDWN. NAD.    ENT: Nose clear, ext NML.  No LAD.  No JVD.  TM's clear. Oropharynx clear.  PULM: Normal WOB, no distress. RLL with crackles, no wheezing or rhonchi CV: RRR, no M/G/R, No rubs, No JVD.   EXT: warm and well-perfused, No c/c/e. PSYCH: Pleasant and conversant.    Laboratory and Imaging Data:  Assessment and Plan:   CAP (community acquired pneumonia)  Right lower lobe crackles with clear other portions of the lung in a setting with absence of systolic heart failure.  He appears euvolemic on examination.  Concern for developing pneumonia, and we'll treat with antibiotics.  At this point he isn't not having any wheezing.  He knows to call if he worsens at all.  Follow-up: No Follow-up on file.  New Prescriptions   AZITHROMYCIN (ZITHROMAX) 250 MG TABLET    2 tabs po on day 1, then 1 tab po for 4 days   Signed,  Tenisha Fleece T. Arlethia Basso, MD   Patient's Medications  New Prescriptions   AZITHROMYCIN (ZITHROMAX) 250 MG TABLET    2 tabs po on day 1, then 1 tab po for 4 days  Previous Medications   ACETAMINOPHEN (TYLENOL) 500 MG TABLET    Take 1,000 mg by mouth every 8 (eight) hours as needed for mild pain, moderate pain or headache.   AMLODIPINE (NORVASC) 5 MG TABLET    Take 1 tablet (5 mg total) by mouth daily.   ATORVASTATIN (LIPITOR) 10 MG TABLET    Take 1 tablet (10 mg total) by mouth daily.   BUDESONIDE-FORMOTEROL (SYMBICORT) 160-4.5 MCG/ACT INHALER    Inhale 2 puffs into the lungs 2 (two) times daily as needed (shortness of breath).    CLOBETASOL (TEMOVATE) 0.05 % EXTERNAL SOLUTION    Apply 1 application topically daily.   CLOPIDOGREL (PLAVIX) 75 MG TABLET    Take 1 tablet by mouth  daily    COLLAGENASE (SANTYL) OINTMENT    Apply 1 application topically daily.   FUROSEMIDE (LASIX) 40 MG TABLET    TAKE 1 TABLET BY MOUTH  EVERY MONDAY, WEDNESDAY,  AND FRIDAY   GABAPENTIN (NEURONTIN) 100 MG CAPSULE    Take 100 mg by mouth 3 (three) times daily.   GLIPIZIDE (GLUCOTROL XL) 5 MG 24 HR TABLET    Take 1 tablet by mouth  daily with breakfast   INSULIN GLARGINE (LANTUS) 100 UNIT/ML INJECTION    Inject 0.1 mLs (10 Units total) into the skin daily.   IPRATROPIUM-ALBUTEROL (DUONEB) 0.5-2.5 (3) MG/3ML SOLN    Take 3 mLs by nebulization every 6 (six) hours as needed.   POLYETHYLENE GLYCOL (MIRALAX / GLYCOLAX) PACKET    Take 17 g by mouth daily as needed.   TIOTROPIUM (SPIRIVA) 18 MCG INHALATION CAPSULE    Place 18 mcg into inhaler and inhale daily.  Modified Medications   No medications on file  Discontinued Medications   No medications on file

## 2016-05-03 NOTE — Progress Notes (Signed)
Pre visit review using our clinic review tool, if applicable. No additional management support is needed unless otherwise documented below in the visit note. 

## 2016-05-07 DIAGNOSIS — Z4781 Encounter for orthopedic aftercare following surgical amputation: Secondary | ICD-10-CM | POA: Diagnosis not present

## 2016-05-07 DIAGNOSIS — I5030 Unspecified diastolic (congestive) heart failure: Secondary | ICD-10-CM | POA: Diagnosis not present

## 2016-05-07 DIAGNOSIS — N183 Chronic kidney disease, stage 3 (moderate): Secondary | ICD-10-CM | POA: Diagnosis not present

## 2016-05-07 DIAGNOSIS — Z9981 Dependence on supplemental oxygen: Secondary | ICD-10-CM | POA: Diagnosis not present

## 2016-05-07 DIAGNOSIS — Z8551 Personal history of malignant neoplasm of bladder: Secondary | ICD-10-CM | POA: Diagnosis not present

## 2016-05-07 DIAGNOSIS — Z87891 Personal history of nicotine dependence: Secondary | ICD-10-CM | POA: Diagnosis not present

## 2016-05-07 DIAGNOSIS — I2510 Atherosclerotic heart disease of native coronary artery without angina pectoris: Secondary | ICD-10-CM | POA: Diagnosis not present

## 2016-05-07 DIAGNOSIS — Z794 Long term (current) use of insulin: Secondary | ICD-10-CM | POA: Diagnosis not present

## 2016-05-07 DIAGNOSIS — E1122 Type 2 diabetes mellitus with diabetic chronic kidney disease: Secondary | ICD-10-CM | POA: Diagnosis not present

## 2016-05-07 DIAGNOSIS — I4891 Unspecified atrial fibrillation: Secondary | ICD-10-CM | POA: Diagnosis not present

## 2016-05-07 DIAGNOSIS — I130 Hypertensive heart and chronic kidney disease with heart failure and stage 1 through stage 4 chronic kidney disease, or unspecified chronic kidney disease: Secondary | ICD-10-CM | POA: Diagnosis not present

## 2016-05-07 DIAGNOSIS — J449 Chronic obstructive pulmonary disease, unspecified: Secondary | ICD-10-CM | POA: Diagnosis not present

## 2016-05-07 DIAGNOSIS — E1151 Type 2 diabetes mellitus with diabetic peripheral angiopathy without gangrene: Secondary | ICD-10-CM | POA: Diagnosis not present

## 2016-05-07 DIAGNOSIS — Z89412 Acquired absence of left great toe: Secondary | ICD-10-CM | POA: Diagnosis not present

## 2016-05-08 ENCOUNTER — Telehealth: Payer: Self-pay

## 2016-05-08 ENCOUNTER — Ambulatory Visit (INDEPENDENT_AMBULATORY_CARE_PROVIDER_SITE_OTHER): Payer: Medicare Other | Admitting: Family Medicine

## 2016-05-08 ENCOUNTER — Encounter: Payer: Self-pay | Admitting: Family Medicine

## 2016-05-08 VITALS — BP 130/58 | HR 72 | Temp 98.3°F | Ht 74.0 in | Wt 209.5 lb

## 2016-05-08 DIAGNOSIS — J441 Chronic obstructive pulmonary disease with (acute) exacerbation: Secondary | ICD-10-CM

## 2016-05-08 DIAGNOSIS — J189 Pneumonia, unspecified organism: Secondary | ICD-10-CM

## 2016-05-08 MED ORDER — PREDNISONE 20 MG PO TABS: 20.0000 mg | ORAL_TABLET | Freq: Every day | ORAL | Status: DC

## 2016-05-08 MED ORDER — DOXYCYCLINE HYCLATE 100 MG PO TABS: 100.0000 mg | ORAL_TABLET | Freq: Two times a day (BID) | ORAL | Status: DC

## 2016-05-08 NOTE — Progress Notes (Signed)
Dr. Frederico Hamman T. Kyelle Urbas, MD, [facility] [place], [address] Phone: [phone] Fax: [phone]  05/08/2016  Patient: [name] Small, MRN: [id number], DOB: 1928-05-24, 82 y.o.  Primary Physician:  [name], MD   Chief Complaint  Patient presents with  . Follow-up    on cough and congestion: no improvement   Subjective:   [name] Small is a 82 y.o. very pleasant male patient who presents with the following:  Feeling ok, just coughing some. Happens every so often. Wife worried.  [place] next week.  He has COPD, congestive heart failure, and insulin-dependent diabetes mellitus. I saw him last week, and placed him on Zithromax. His wife is worried, feels like his congestion in his chest and breathing is gotten somewhat worse. He doesn't think that he is wheezing in particular or having dyspnea on exertion.  He is still coughing up some sputum significantly.  Lab Results  Component Value Date   HGBA1C 9.2* 02/19/2016      05/03/2016 Last OV with Owens Loffler, MD  Some congestion in nose and chest for 1 week.  No fever Eating and drinking.  Pleasant gentleman that has COPD that is well controlled who presents with worsening cold symptoms that have progressed and moved into his chest.  He has not noticed any wheezing, nor has his wife.  He is not using his inhalers any significantly more than he typically would, but he is having some mild shortness of breath, and he is having significant cough with production of sputum.  He is currently afebrile.  His last echocardiogram showed an EF of 50-55% with only grade 1 diastolic dysfunction.  Past Medical History, Surgical History, Social History, Family History, Problem List, Medications, and Allergies have been reviewed and updated if relevant.  Patient Active Problem List   Diagnosis Date Noted  . Diabetic ulcer of left foot ([place]) 03/24/2016  .  Diabetic ulcer of left foot associated with diabetes mellitus due to underlying condition ([place])   . Weakness 03/18/2016  . Mood disorder ([place]) 03/18/2016  . Groin hematoma   . Chronic renal failure   . Critical lower limb ischemia 02/28/2016  . Peripheral arterial disease ([place]) 10/17/2015  . Edema 07/19/2015  . Claudication ([place]) 01/31/2015  . DOE (dyspnea on exertion) 01/31/2015  . Hyperlipidemia 01/31/2015  . HTN (hypertension) 01/31/2015  . Chronic diastolic CHF (congestive heart failure) ([place]) 01/21/2014  . Seborrheic keratoses 04/30/2013  . CHF (congestive heart failure) ([place]) 12/16/2012  . Atrial fibrillation ([place]) 12/16/2012  . Hyperkalemia 11/27/2011  . Bladder cancer ([place]) 09/22/2011  . Leg cramps 08/28/2011  . Cough 05/26/2011  . CAD (coronary artery disease) of artery bypass graft 05/09/2011  . Dyspnea on exertion 04/19/2011  . PSORIASIS, SCALP 10/25/2008  . Chronic kidney disease, stage III (moderate) 12/04/2007  . Diabetes with neurologic complications ([place]) [id number]  . DIVERTICULOSIS, COLON 05/21/2007  . BENIGN PROSTATIC HYPERTROPHY 05/21/2007    Past Medical History  Diagnosis Date  . BENIGN PROSTATIC HYPERTROPHY 05/21/2007  . DIVERTICULOSIS, COLON 05/21/2007    pt denies  . HYPERTENSION 05/21/2007  . PSORIASIS, SCALP 10/25/2008  . Cellulitis of left foot     Hospitalized in 2006  . Gross hematuria     pt denies  . Psoriasis ELBOWS  . Fatigue AGE-RELATED  . CAD (coronary artery disease) CARDIOLOGIST- DR WALL - VISIT IN JUNE 2012 FOR CATH    nonobstructive by cath 6/12:  mid LAD 30%, proximal obtuse marginal-2 30%, proximal RCA 20%, mid RCA 30-40%.  He had normal cardiac output and mildly elevated filling pressures but no significant pulmonary hypertension;   Echocardiogram in May 2012 demonstrated EF 50-55% and left atrial enlargement   . Impaired hearing BILATERAL HEARING AIDS  . Back pain     s/p lumbar injection 2014  . Chronic kidney disease (CKD),  stage III (moderate) 12/04/2007    FOLLOWED BY DR PATEL  . PAD (peripheral artery disease) ([place])   . On home oxygen therapy     "2L at nighttime" (02/28/2016)  . DIABETES MELLITUS, TYPE II 05/21/2007  . Arthritis   . Chronic pain of lower extremity   . Bladder cancer ([place]) 10/2011  . CHF (congestive heart failure) ([place])     per family, issues in 2012    Past Surgical History  Procedure Laterality Date  . Vasectomy  1990s  . Penile prosthesis implant  1990s  . Cardiovascular stress test  2007  . Cardiac catheterization  04-24-11/  DR MUHAMMAD ARIDA    MILD NONOBSTRUCTIVE CAD, NORMA CARDIAC OUTPUT  . Appendectomy  1941  . Tonsillectomy and adenoidectomy  1962  . Cataract extraction w/ intraocular lens  implant, bilateral Bilateral ~ 2010  . Incision and drainage foot Left ~ 2005    LEFT FOOT DUE TO INFECTION FROM  NAIL PUNCTURE INJURY  . Cystoscopy w/ retrogrades  10/30/2011    Procedure: CYSTOSCOPY WITH RETROGRADE PYELOGRAM;  Surgeon: Molli Hazard, MD;  Location: [facility];  Service: Urology;  Laterality: Bilateral;  CYSTOSCOPY POSS TURBT BILATERAL RETROGRADE PYLEOGRAM   . Transurethral resection of bladder tumor  10/30/2011    Procedure: TRANSURETHRAL RESECTION OF BLADDER TUMOR (TURBT);  Surgeon: Molli Hazard, MD;  Location: [facility];  Service: Urology;  Laterality: N/A;  . Cystoscopy  12/25/2011    Procedure: CYSTOSCOPY;  Surgeon: Molli Hazard, MD;  Location: [facility];  Service: Urology;  Laterality: N/A;  needs intubation and to be paralyzed   . Transurethral resection of bladder tumor  12/25/2011    Procedure: TRANSURETHRAL RESECTION OF BLADDER TUMOR (TURBT);  Surgeon: Molli Hazard, MD;  Location: [facility];  Service: Urology;  Laterality: N/A;  need long gyrus instruments   . Cystoscopy w/ retrogrades  11/30/2012    Procedure: CYSTOSCOPY WITH RETROGRADE PYELOGRAM;  Surgeon:  Molli Hazard, MD;  [facility];  Service: Urology;  Laterality: Bilateral;  Flexible cystoscopy.  . Cystoscopy with biopsy  11/30/2012    Procedure: CYSTOSCOPY WITH BIOPSY;  Surgeon: Molli Hazard, MD;  Location: [facility];  Service: Urology;  Laterality: N/A;  . Peripheral vascular catheterization N/A 02/14/2016    Procedure: Abdominal Aortogram w/Lower Extremity;  Surgeon: Wellington Hampshire, MD;  Location: [facility];  Service: Cardiovascular;  Laterality: N/A;  . Peripheral vascular catheterization Left 02/28/2016    Procedure: Peripheral Vascular Balloon Angioplasty;  Surgeon: Wellington Hampshire, MD;  Location: [facility];  Service: Cardiovascular;  Laterality: Left;  left peroneal and popliteal artery  . Amputation Left 03/27/2016    Procedure: partial first AMPUTATION RAY; LEFT;  Surgeon: Trula Slade, DPM;  Location: [facility];  Service: Podiatry;  Laterality: Left;    Social History   Social History  . Marital Status: Married    Spouse Name: N/A  . Number of Children: 4  . Years of Education: N/A  Occupational History  . retired      [employer]	[place].   Social History Main Topics  . Smoking status: Former Smoker -- 2.00 packs/day for 22 years    Types: Cigarettes    Quit date: 11/11/1968  . Smokeless tobacco: Never Used  . Alcohol Use: 1.2 oz/week    2 Glasses of wine per week     Comment: occassional  . Drug Use: No  . Sexual Activity: Not Currently   Other Topics Concern  . Not on file   Social History Narrative   Retired Event organiser.   Active with golf.   [place] vet.  [place] [phone][place]      Family History  Problem Relation Age of Onset  . Diabetes Mother   . Heart disease Brother   . Heart disease Brother   . Heart disease Brother   . Heart disease Brother   . Heart disease Brother   . Heart disease Brother   . Lung cancer Brother     Allergies  Allergen Reactions  . Actos  [Pioglitazone Hydrochloride] Other (See Comments)    Held 2012 bladder cancer  . Aspirin Other (See Comments)    Held 2012 due to hematuria and bruising.   . Ace Inhibitors Cough  . Gabapentin     Upset stomach and diarrhea - tolerates low doses  . Lyrica [Pregabalin] Other (See Comments)    swelling  . Pravastatin Other (See Comments)    Swelling of feet  . Bactrim Rash and Other (See Comments)    Rash, presumed allergy  . Sulfa Drugs Cross Reactors Rash    Medication list reviewed and updated in full in [place].  ROS: GEN: Acute illness details above GI: Tolerating PO intake GU: maintaining adequate hydration and urination Pulm: No SOB Interactive and getting along well at home.  Otherwise, ROS is as per the HPI.  Objective:   BP 130/58 mmHg  Pulse 72  Temp(Src) 98.3 F (36.8 C) (Oral)  Ht 6\' 2"  (1.88 m)  Wt 209 lb 8 oz (95.029 kg)  BMI 26.89 kg/m2  SpO2 93%   GEN: A and O x 3. WDWN. NAD.    ENT: Nose clear, ext NML.  No LAD.  No JVD.  TM's clear. Oropharynx clear.  PULM: Normal WOB, no distress. Diffuse rhonchorous sounds with wheezing throughout all lung fields.  CV: RRR, no M/G/R, No rubs, No JVD.   EXT: warm and well-perfused, No c/c/e. PSYCH: Pleasant and conversant.    Laboratory and Imaging Data:  Assessment and Plan:   COPD exacerbation ([place])  CAP (community acquired pneumonia)  At this point, his picture seems to be more of COPD exacerbation plus or minus pneumonia or resolving pneumonia. Add prednisone in a diabetic carefully, and change antibiotics.  Continue with nebulizers.  Follow-up: No Follow-up on file.  New Prescriptions   DOXYCYCLINE (VIBRA-TABS) 100 MG TABLET    Take 1 tablet (100 mg total) by mouth 2 (two) times daily.   PREDNISONE (DELTASONE) 20 MG TABLET    Take 1 tablet (20 mg total) by mouth daily with breakfast.   Signed,  Frederico Hamman T. Bevan Disney, MD   Patient's Medications  New Prescriptions   DOXYCYCLINE  (VIBRA-TABS) 100 MG TABLET    Take 1 tablet (100 mg total) by mouth 2 (two) times daily.   PREDNISONE (DELTASONE) 20 MG TABLET    Take 1 tablet (20 mg total) by mouth daily with breakfast.  Previous Medications   ACETAMINOPHEN (TYLENOL)  500 MG TABLET    Take 1,000 mg by mouth every 8 (eight) hours as needed for mild pain, moderate pain or headache.   AMLODIPINE (NORVASC) 5 MG TABLET    Take 1 tablet (5 mg total) by mouth daily.   ATORVASTATIN (LIPITOR) 10 MG TABLET    Take 1 tablet (10 mg total) by mouth daily.   BUDESONIDE-FORMOTEROL (SYMBICORT) 160-4.5 MCG/ACT INHALER    Inhale 2 puffs into the lungs 2 (two) times daily as needed (shortness of breath).    CLOBETASOL (TEMOVATE) 0.05 % EXTERNAL SOLUTION    Apply 1 application topically daily.   CLOPIDOGREL (PLAVIX) 75 MG TABLET    Take 1 tablet by mouth  daily   COLLAGENASE (SANTYL) OINTMENT    Apply 1 application topically daily.   FUROSEMIDE (LASIX) 40 MG TABLET    TAKE 1 TABLET BY MOUTH  EVERY MONDAY, WEDNESDAY,  AND FRIDAY   GABAPENTIN (NEURONTIN) 100 MG CAPSULE    Take 100 mg by mouth 3 (three) times daily.   GLIPIZIDE (GLUCOTROL XL) 5 MG 24 HR TABLET    Take 1 tablet by mouth  daily with breakfast   INSULIN GLARGINE (LANTUS) 100 UNIT/ML INJECTION    Inject 0.1 mLs (10 Units total) into the skin daily.   IPRATROPIUM-ALBUTEROL (DUONEB) 0.5-2.5 (3) MG/3ML SOLN    Take 3 mLs by nebulization every 6 (six) hours as needed.   POLYETHYLENE GLYCOL (MIRALAX / GLYCOLAX) PACKET    Take 17 g by mouth daily as needed.   TIOTROPIUM (SPIRIVA) 18 MCG INHALATION CAPSULE    Place 18 mcg into inhaler and inhale daily.  Modified Medications   No medications on file  Discontinued Medications   AZITHROMYCIN (ZITHROMAX) 250 MG TABLET    2 tabs po on day 1, then 1 tab po for 4 days

## 2016-05-08 NOTE — Telephone Encounter (Signed)
[name] DPR signed said pt was seen 05/03/16; has finished zpak; pt continues with prod cough with phlegm ([name] not sure of color of phlegm) and can hear congestion in chest but [name] cannot say if wheezing or not. Pt is not having problem getting his breath; pt using nebulizer bid. [name] cannot see any improvement in cough and chest congestion and wants appt for recheck to be on safe side. Pt has appt 05/08/16 at 3:15 with Dr Lorelei Pont. FYI to Dr Lorelei Pont,

## 2016-05-10 ENCOUNTER — Encounter: Payer: Self-pay | Admitting: Podiatry

## 2016-05-10 ENCOUNTER — Ambulatory Visit (INDEPENDENT_AMBULATORY_CARE_PROVIDER_SITE_OTHER): Payer: Medicare Other

## 2016-05-10 ENCOUNTER — Ambulatory Visit (INDEPENDENT_AMBULATORY_CARE_PROVIDER_SITE_OTHER): Payer: Medicare Other | Admitting: Podiatry

## 2016-05-10 VITALS — BP 125/43 | HR 79 | Resp 16

## 2016-05-10 DIAGNOSIS — E1140 Type 2 diabetes mellitus with diabetic neuropathy, unspecified: Secondary | ICD-10-CM | POA: Diagnosis not present

## 2016-05-10 DIAGNOSIS — L97521 Non-pressure chronic ulcer of other part of left foot limited to breakdown of skin: Secondary | ICD-10-CM | POA: Diagnosis not present

## 2016-05-10 DIAGNOSIS — Z9889 Other specified postprocedural states: Secondary | ICD-10-CM | POA: Diagnosis not present

## 2016-05-12 DIAGNOSIS — J449 Chronic obstructive pulmonary disease, unspecified: Secondary | ICD-10-CM | POA: Diagnosis not present

## 2016-05-20 ENCOUNTER — Encounter: Payer: Self-pay | Admitting: Podiatry

## 2016-05-20 DIAGNOSIS — L97519 Non-pressure chronic ulcer of other part of right foot with unspecified severity: Secondary | ICD-10-CM | POA: Insufficient documentation

## 2016-05-20 NOTE — Progress Notes (Signed)
Patient ID: [name] Small, male   DOB: November 03, 1928, 82 y.o.   MRN: [id number]  Subjective: [name] Small is a 82 y.o. is seen today in office s/p left partial 1st ray amputation. He presented for follow-up evaluation. He has transition to regular shoe he states he has done well. He denies any acute changes his last appointment concerns today. Denies any pain to the area. Denies any systemic complaints such as fevers, chills, nausea, vomiting. No calf pain, chest pain, shortness of breath.   Objective: General: No acute distress, AAOx3  DP/PT pulses palpable 2/4, CRT < 3 sec to all digits.  Motor function intact.  Left foot: Incision is well coapted without any evidence of dehiscence and a scar is formed. There is hammertoe contractures present lesser digits. The distal aspect of the left second toe is a small superficial wound present. Is no probing, undermining time. No edema, erythema. There is no clinical signs of infection. No other open lesions or pre-ulcerative lesions at [facility] this time. No other open lesions or pre-ulcerative lesions.  No pain with calf compression, swelling, warmth, erythema.   Assessment and Plan:  Status post left partial 1st ray amputation, doing well with no complications   -Treatment options discussed including all alternatives, risks, and complications -This time he is doing well. The indications site. The monitor. -There is a wound present distal aspect of the left second toe. This is superficial is no signs of infection however continue to monitor closely. Offloading pads were dispensed. Antibiotic ointment and a dressing applied daily. Monitor for signs or symptoms of infection and directed to the ER should any occur.  Celesta Gentile, DPM

## 2016-05-23 DIAGNOSIS — I509 Heart failure, unspecified: Secondary | ICD-10-CM | POA: Diagnosis not present

## 2016-05-28 ENCOUNTER — Other Ambulatory Visit: Payer: Medicare Other

## 2016-06-07 ENCOUNTER — Other Ambulatory Visit: Payer: Self-pay | Admitting: Podiatry

## 2016-06-07 ENCOUNTER — Ambulatory Visit (INDEPENDENT_AMBULATORY_CARE_PROVIDER_SITE_OTHER): Payer: Medicare Other | Admitting: Podiatry

## 2016-06-07 ENCOUNTER — Encounter: Payer: Self-pay | Admitting: Podiatry

## 2016-06-07 ENCOUNTER — Ambulatory Visit (INDEPENDENT_AMBULATORY_CARE_PROVIDER_SITE_OTHER): Payer: Medicare Other

## 2016-06-07 ENCOUNTER — Telehealth: Payer: Self-pay | Admitting: *Deleted

## 2016-06-07 VITALS — BP 147/67 | HR 67 | Temp 97.7°F | Resp 18

## 2016-06-07 DIAGNOSIS — Z09 Encounter for follow-up examination after completed treatment for conditions other than malignant neoplasm: Secondary | ICD-10-CM

## 2016-06-07 DIAGNOSIS — L97521 Non-pressure chronic ulcer of other part of left foot limited to breakdown of skin: Secondary | ICD-10-CM

## 2016-06-07 DIAGNOSIS — M8610 Other acute osteomyelitis, unspecified site: Secondary | ICD-10-CM | POA: Diagnosis not present

## 2016-06-07 DIAGNOSIS — E1140 Type 2 diabetes mellitus with diabetic neuropathy, unspecified: Secondary | ICD-10-CM

## 2016-06-07 DIAGNOSIS — M86172 Other acute osteomyelitis, left ankle and foot: Secondary | ICD-10-CM

## 2016-06-07 DIAGNOSIS — M86179 Other acute osteomyelitis, unspecified ankle and foot: Secondary | ICD-10-CM | POA: Diagnosis not present

## 2016-06-07 MED ORDER — CLINDAMYCIN HCL 300 MG PO CAPS: 300.0000 mg | ORAL_CAPSULE | Freq: Three times a day (TID) | ORAL | 2 refills | Status: DC

## 2016-06-07 NOTE — Telephone Encounter (Signed)
Called and spoke with Alisha at [facility] to have 1. Bone pathology of 2nd toe left, 2. 2nd toe left wound abscess, 3. 2nd toe left toe bone culture and the confirmation number is [id number]. [name] Small

## 2016-06-08 NOTE — Progress Notes (Signed)
Patient ID: [name] Small, male   DOB: 09-16-1928, 82 y.o.   MRN: [id number]  Subjective: [name] Small is a 82 y.o. is seen today in office for follow-up evaluation of left second toe ulceration. Patient's wife states that his been increased swelling as well as redness the toe. Denies any drainage or pus.  Denies any pain to the area. Denies any systemic complaints such as fevers, chills, nausea, vomiting. No calf pain, chest pain, shortness of breath.   Objective: General: No acute distress, AAOx3  DP/PT pulses palpable 2/4, CRT < 3 sec to all digits.  Motor function intact.  Left foot: Incision is well coapted without any evidence of dehiscence and a scar is formed upon the partial first ray amputation site. There is hammertoe contractures present lesser digits. At the distal aspect the left second toe is an ulceration which which appears to be a blistering around the distal last of the toe as well as edema to the toe in a faint amount of erythema. There is no ascending saline disc there is no edema, increase in warmth the dorsal aspect of the foot. Upon debridement of the wound of the distal last of the toe the wound does probe directly down to bone and I'm able to visualize the bone as well. There is no fluctuance or crepitus is no malodor. In the debridement small Mena pus was expressed and this was cultured today. See procedure note below. No other open lesions or pre-ulcerative lesions.  No pain with calf compression, swelling, warmth, erythema.   Assessment and Plan:  Second toe osteomyelitis, the lightest  -Treatment options discussed including all alternatives, risks, and complications -X-rays obtain reviewed. Likely cortical changes suggestive of osteomyelitis of the second toe of the left foot. No soft tissue emphysema is present. -In the debridement of the wounds to his mom amount of pus expressed today. This was cultured and sent for microbiology. Also today is able to visualize  the distal phalanx of the toe. Because of this I did take a bone cutter and resected portion of the bone at of the toe as Synvisc for both pathology and microbiology as well. I discussed with him at least partial amputation of the toe. We will await the results of the culture before proceeding with any further surgical intervention. He has ciprofloxacin home and he is to restart the sciatic clindamycin to this as well. Monitor for any signs or symptoms of worsening infection is to go medially to the emergency room should any occur. Otherwise will follow-up with him in 1 week.  Celesta Gentile, DPM

## 2016-06-10 ENCOUNTER — Telehealth: Payer: Self-pay | Admitting: *Deleted

## 2016-06-10 ENCOUNTER — Inpatient Hospital Stay (HOSPITAL_COMMUNITY)
Admission: EM | Admit: 2016-06-10 | Discharge: 2016-06-14 | DRG: 240 | Disposition: A | Discharge status: Home Health | Payer: Medicare Other | Attending: Internal Medicine | Admitting: Internal Medicine

## 2016-06-10 ENCOUNTER — Encounter: Payer: Self-pay | Admitting: Podiatry

## 2016-06-10 ENCOUNTER — Ambulatory Visit: Payer: Medicare Other | Admitting: Podiatry

## 2016-06-10 ENCOUNTER — Encounter (HOSPITAL_COMMUNITY): Payer: Self-pay | Admitting: Emergency Medicine

## 2016-06-10 ENCOUNTER — Inpatient Hospital Stay (HOSPITAL_COMMUNITY): Payer: Medicare Other

## 2016-06-10 DIAGNOSIS — N183 Chronic kidney disease, stage 3 unspecified: Secondary | ICD-10-CM | POA: Diagnosis present

## 2016-06-10 DIAGNOSIS — L97529 Non-pressure chronic ulcer of other part of left foot with unspecified severity: Secondary | ICD-10-CM | POA: Diagnosis present

## 2016-06-10 DIAGNOSIS — I509 Heart failure, unspecified: Secondary | ICD-10-CM | POA: Diagnosis present

## 2016-06-10 DIAGNOSIS — E1169 Type 2 diabetes mellitus with other specified complication: Secondary | ICD-10-CM | POA: Diagnosis present

## 2016-06-10 DIAGNOSIS — Z8551 Personal history of malignant neoplasm of bladder: Secondary | ICD-10-CM | POA: Diagnosis not present

## 2016-06-10 DIAGNOSIS — M869 Osteomyelitis, unspecified: Secondary | ICD-10-CM | POA: Diagnosis present

## 2016-06-10 DIAGNOSIS — E1140 Type 2 diabetes mellitus with diabetic neuropathy, unspecified: Secondary | ICD-10-CM | POA: Diagnosis not present

## 2016-06-10 DIAGNOSIS — Z9841 Cataract extraction status, right eye: Secondary | ICD-10-CM

## 2016-06-10 DIAGNOSIS — E1142 Type 2 diabetes mellitus with diabetic polyneuropathy: Secondary | ICD-10-CM | POA: Diagnosis present

## 2016-06-10 DIAGNOSIS — L089 Local infection of the skin and subcutaneous tissue, unspecified: Secondary | ICD-10-CM | POA: Diagnosis not present

## 2016-06-10 DIAGNOSIS — Z87891 Personal history of nicotine dependence: Secondary | ICD-10-CM | POA: Diagnosis not present

## 2016-06-10 DIAGNOSIS — M86679 Other chronic osteomyelitis, unspecified ankle and foot: Secondary | ICD-10-CM | POA: Diagnosis not present

## 2016-06-10 DIAGNOSIS — N179 Acute kidney failure, unspecified: Secondary | ICD-10-CM | POA: Diagnosis not present

## 2016-06-10 DIAGNOSIS — Z7951 Long term (current) use of inhaled steroids: Secondary | ICD-10-CM

## 2016-06-10 DIAGNOSIS — I10 Essential (primary) hypertension: Secondary | ICD-10-CM

## 2016-06-10 DIAGNOSIS — Z794 Long term (current) use of insulin: Secondary | ICD-10-CM

## 2016-06-10 DIAGNOSIS — Z9221 Personal history of antineoplastic chemotherapy: Secondary | ICD-10-CM

## 2016-06-10 DIAGNOSIS — E1165 Type 2 diabetes mellitus with hyperglycemia: Secondary | ICD-10-CM | POA: Diagnosis present

## 2016-06-10 DIAGNOSIS — N400 Enlarged prostate without lower urinary tract symptoms: Secondary | ICD-10-CM | POA: Diagnosis not present

## 2016-06-10 DIAGNOSIS — J449 Chronic obstructive pulmonary disease, unspecified: Secondary | ICD-10-CM

## 2016-06-10 DIAGNOSIS — I2510 Atherosclerotic heart disease of native coronary artery without angina pectoris: Secondary | ICD-10-CM | POA: Diagnosis not present

## 2016-06-10 DIAGNOSIS — Z79899 Other long term (current) drug therapy: Secondary | ICD-10-CM

## 2016-06-10 DIAGNOSIS — L0390 Cellulitis, unspecified: Secondary | ICD-10-CM | POA: Diagnosis not present

## 2016-06-10 DIAGNOSIS — Z882 Allergy status to sulfonamides status: Secondary | ICD-10-CM

## 2016-06-10 DIAGNOSIS — I4891 Unspecified atrial fibrillation: Secondary | ICD-10-CM | POA: Diagnosis not present

## 2016-06-10 DIAGNOSIS — M868X7 Other osteomyelitis, ankle and foot: Secondary | ICD-10-CM | POA: Diagnosis not present

## 2016-06-10 DIAGNOSIS — L409 Psoriasis, unspecified: Secondary | ICD-10-CM | POA: Diagnosis not present

## 2016-06-10 DIAGNOSIS — Z9842 Cataract extraction status, left eye: Secondary | ICD-10-CM

## 2016-06-10 DIAGNOSIS — E1122 Type 2 diabetes mellitus with diabetic chronic kidney disease: Secondary | ICD-10-CM | POA: Diagnosis present

## 2016-06-10 DIAGNOSIS — I130 Hypertensive heart and chronic kidney disease with heart failure and stage 1 through stage 4 chronic kidney disease, or unspecified chronic kidney disease: Secondary | ICD-10-CM | POA: Diagnosis present

## 2016-06-10 DIAGNOSIS — I25810 Atherosclerosis of coronary artery bypass graft(s) without angina pectoris: Secondary | ICD-10-CM | POA: Diagnosis present

## 2016-06-10 DIAGNOSIS — E11621 Type 2 diabetes mellitus with foot ulcer: Secondary | ICD-10-CM | POA: Diagnosis not present

## 2016-06-10 DIAGNOSIS — Z961 Presence of intraocular lens: Secondary | ICD-10-CM | POA: Diagnosis present

## 2016-06-10 DIAGNOSIS — Z888 Allergy status to other drugs, medicaments and biological substances status: Secondary | ICD-10-CM

## 2016-06-10 DIAGNOSIS — L97509 Non-pressure chronic ulcer of other part of unspecified foot with unspecified severity: Secondary | ICD-10-CM | POA: Diagnosis not present

## 2016-06-10 DIAGNOSIS — E119 Type 2 diabetes mellitus without complications: Secondary | ICD-10-CM | POA: Diagnosis present

## 2016-06-10 DIAGNOSIS — Z881 Allergy status to other antibiotic agents status: Secondary | ICD-10-CM

## 2016-06-10 DIAGNOSIS — I70262 Atherosclerosis of native arteries of extremities with gangrene, left leg: Secondary | ICD-10-CM | POA: Diagnosis not present

## 2016-06-10 DIAGNOSIS — Z7902 Long term (current) use of antithrombotics/antiplatelets: Secondary | ICD-10-CM

## 2016-06-10 DIAGNOSIS — M1990 Unspecified osteoarthritis, unspecified site: Secondary | ICD-10-CM | POA: Diagnosis not present

## 2016-06-10 DIAGNOSIS — M86272 Subacute osteomyelitis, left ankle and foot: Secondary | ICD-10-CM | POA: Diagnosis not present

## 2016-06-10 DIAGNOSIS — C679 Malignant neoplasm of bladder, unspecified: Secondary | ICD-10-CM | POA: Diagnosis not present

## 2016-06-10 DIAGNOSIS — E1152 Type 2 diabetes mellitus with diabetic peripheral angiopathy with gangrene: Principal | ICD-10-CM | POA: Diagnosis present

## 2016-06-10 DIAGNOSIS — M86172 Other acute osteomyelitis, left ankle and foot: Secondary | ICD-10-CM

## 2016-06-10 DIAGNOSIS — M79675 Pain in left toe(s): Secondary | ICD-10-CM | POA: Diagnosis not present

## 2016-06-10 DIAGNOSIS — M8610 Other acute osteomyelitis, unspecified site: Secondary | ICD-10-CM | POA: Diagnosis not present

## 2016-06-10 DIAGNOSIS — Z886 Allergy status to analgesic agent status: Secondary | ICD-10-CM

## 2016-06-10 DIAGNOSIS — R0602 Shortness of breath: Secondary | ICD-10-CM | POA: Diagnosis not present

## 2016-06-10 LAB — PROTIME-INR
INR: 1.09
Prothrombin Time: 14.1 seconds (ref 11.4–15.2)

## 2016-06-10 LAB — BASIC METABOLIC PANEL
Anion gap: 8 (ref 5–15)
BUN: 28 mg/dL — ABNORMAL HIGH (ref 6–20)
CO2: 30 mmol/L (ref 22–32)
Calcium: 9.2 mg/dL (ref 8.9–10.3)
Chloride: 99 mmol/L — ABNORMAL LOW (ref 101–111)
Creatinine, Ser: 1.84 mg/dL — ABNORMAL HIGH (ref 0.61–1.24)
GFR calc Af Amer: 37 mL/min — ABNORMAL LOW (ref >60)
GFR calc non Af Amer: 32 mL/min — ABNORMAL LOW (ref >60)
Glucose, Bld: 270 mg/dL — ABNORMAL HIGH (ref 65–99)
Potassium: 4.8 mmol/L (ref 3.5–5.1)
Sodium: 137 mmol/L (ref 135–145)

## 2016-06-10 LAB — CBC WITH DIFFERENTIAL/PLATELET
Basophils Absolute: 0.1 10*3/uL (ref 0.0–0.1)
Basophils Relative: 1 %
Eosinophils Absolute: 0.3 10*3/uL (ref 0.0–0.7)
Eosinophils Relative: 3 %
HCT: 42.4 % (ref 39.0–52.0)
Hemoglobin: 13.9 g/dL (ref 13.0–17.0)
Lymphocytes Relative: 14 %
Lymphs Abs: 1.3 10*3/uL (ref 0.7–4.0)
MCH: 32 pg (ref 26.0–34.0)
MCHC: 32.8 g/dL (ref 30.0–36.0)
MCV: 97.7 fL (ref 78.0–100.0)
Monocytes Absolute: 0.8 10*3/uL (ref 0.1–1.0)
Monocytes Relative: 9 %
Neutro Abs: 6.5 10*3/uL (ref 1.7–7.7)
Neutrophils Relative %: 73 %
Platelets: 319 10*3/uL (ref 150–400)
RBC: 4.34 MIL/uL (ref 4.22–5.81)
RDW: 12.8 % (ref 11.5–15.5)
WBC: 9 10*3/uL (ref 4.0–10.5)

## 2016-06-10 LAB — GLUCOSE, CAPILLARY
Glucose-Capillary: 225 mg/dL — ABNORMAL HIGH (ref 65–99)
Glucose-Capillary: 324 mg/dL — ABNORMAL HIGH (ref 65–99)
Glucose-Capillary: 78 mg/dL (ref 65–99)

## 2016-06-10 LAB — APTT: aPTT: 34 seconds (ref 24–36)

## 2016-06-10 LAB — CBG MONITORING, ED: Glucose-Capillary: 268 mg/dL — ABNORMAL HIGH (ref 65–99)

## 2016-06-10 MED ORDER — ALPRAZOLAM 0.25 MG PO TABS
0.2500 mg | ORAL_TABLET | Freq: Two times a day (BID) | ORAL | Status: DC | PRN
Start: 2016-06-10 — End: 2016-06-11

## 2016-06-10 MED ORDER — INSULIN ASPART 100 UNIT/ML ~~LOC~~ SOLN
0.0000 [IU] | Freq: Three times a day (TID) | SUBCUTANEOUS | Status: DC
  Administered 2016-06-10: 11 [IU] via SUBCUTANEOUS
  Administered 2016-06-11: 3 [IU] via SUBCUTANEOUS
  Administered 2016-06-11: 8 [IU] via SUBCUTANEOUS
  Administered 2016-06-12: 2 [IU] via SUBCUTANEOUS
  Administered 2016-06-13: 5 [IU] via SUBCUTANEOUS
  Administered 2016-06-13: 8 [IU] via SUBCUTANEOUS
  Administered 2016-06-14: 3 [IU] via SUBCUTANEOUS
  Administered 2016-06-14: 8 [IU] via SUBCUTANEOUS

## 2016-06-10 MED ORDER — VANCOMYCIN HCL IN DEXTROSE 1-5 GM/200ML-% IV SOLN
1000.0000 mg | Freq: Once | INTRAVENOUS | Status: AC
  Administered 2016-06-10: 1000 mg via INTRAVENOUS
  Filled 2016-06-10: qty 200

## 2016-06-10 MED ORDER — ONDANSETRON HCL 4 MG/2ML IJ SOLN: 4.0000 mg | Freq: Four times a day (QID) | INTRAMUSCULAR | Status: DC | PRN

## 2016-06-10 MED ORDER — SODIUM CHLORIDE 0.9 % IV SOLN
INTRAVENOUS | Status: AC
  Administered 2016-06-10: 15:00:00 via INTRAVENOUS

## 2016-06-10 MED ORDER — FUROSEMIDE 40 MG PO TABS
40.0000 mg | ORAL_TABLET | ORAL | Status: DC
Start: 2016-06-10 — End: 2016-06-11
  Filled 2016-06-10 (×2): qty 1

## 2016-06-10 MED ORDER — INSULIN ASPART 100 UNIT/ML ~~LOC~~ SOLN
0.0000 [IU] | Freq: Every day | SUBCUTANEOUS | Status: DC
  Administered 2016-06-11: 2 [IU] via SUBCUTANEOUS

## 2016-06-10 MED ORDER — AMLODIPINE BESYLATE 5 MG PO TABS
5.0000 mg | ORAL_TABLET | Freq: Every day | ORAL | Status: DC
  Administered 2016-06-11 – 2016-06-14 (×4): 5 mg via ORAL
  Filled 2016-06-10 (×5): qty 1

## 2016-06-10 MED ORDER — TIOTROPIUM BROMIDE MONOHYDRATE 18 MCG IN CAPS
18.0000 ug | ORAL_CAPSULE | Freq: Every day | RESPIRATORY_TRACT | Status: DC
  Filled 2016-06-10: qty 5

## 2016-06-10 MED ORDER — GABAPENTIN 100 MG PO CAPS
100.0000 mg | ORAL_CAPSULE | Freq: Three times a day (TID) | ORAL | Status: DC
  Administered 2016-06-10 – 2016-06-14 (×11): 100 mg via ORAL
  Filled 2016-06-10 (×11): qty 1

## 2016-06-10 MED ORDER — POLYETHYLENE GLYCOL 3350 17 G PO PACK
17.0000 g | PACK | Freq: Every day | ORAL | Status: DC | PRN
  Administered 2016-06-13: 17 g via ORAL
  Filled 2016-06-10: qty 1

## 2016-06-10 MED ORDER — ACETAMINOPHEN 325 MG PO TABS: 650.0000 mg | ORAL_TABLET | Freq: Four times a day (QID) | ORAL | Status: DC | PRN

## 2016-06-10 MED ORDER — ATORVASTATIN CALCIUM 10 MG PO TABS
10.0000 mg | ORAL_TABLET | Freq: Every day | ORAL | Status: DC
  Administered 2016-06-11 – 2016-06-14 (×4): 10 mg via ORAL
  Filled 2016-06-10 (×5): qty 1

## 2016-06-10 MED ORDER — VANCOMYCIN HCL 10 G IV SOLR
1250.0000 mg | INTRAVENOUS | Status: DC
  Administered 2016-06-11 – 2016-06-12 (×2): 1250 mg via INTRAVENOUS
  Filled 2016-06-10 (×3): qty 1250

## 2016-06-10 MED ORDER — INSULIN GLARGINE 100 UNIT/ML ~~LOC~~ SOLN
12.0000 [IU] | Freq: Every day | SUBCUTANEOUS | Status: DC
  Administered 2016-06-10: 12 [IU] via SUBCUTANEOUS
  Filled 2016-06-10 (×2): qty 0.12

## 2016-06-10 MED ORDER — IPRATROPIUM-ALBUTEROL 0.5-2.5 (3) MG/3ML IN SOLN
3.0000 mL | Freq: Four times a day (QID) | RESPIRATORY_TRACT | Status: DC | PRN
  Administered 2016-06-11 – 2016-06-14 (×4): 3 mL via RESPIRATORY_TRACT
  Filled 2016-06-10 (×4): qty 3

## 2016-06-10 MED ORDER — ONDANSETRON HCL 4 MG PO TABS: 4.0000 mg | ORAL_TABLET | Freq: Four times a day (QID) | ORAL | Status: DC | PRN

## 2016-06-10 MED ORDER — ATORVASTATIN CALCIUM 10 MG PO TABS: 10.0000 mg | ORAL_TABLET | Freq: Every day | ORAL | Status: DC

## 2016-06-10 MED ORDER — IPRATROPIUM-ALBUTEROL 0.5-2.5 (3) MG/3ML IN SOLN: 3.0000 mL | Freq: Four times a day (QID) | RESPIRATORY_TRACT | Status: DC

## 2016-06-10 MED ORDER — AMLODIPINE BESYLATE 5 MG PO TABS: 5.0000 mg | ORAL_TABLET | Freq: Every day | ORAL | Status: DC

## 2016-06-10 MED ORDER — VANCOMYCIN HCL IN DEXTROSE 1-5 GM/200ML-% IV SOLN: 1000.0000 mg | INTRAVENOUS | Status: DC

## 2016-06-10 MED ORDER — BISACODYL 5 MG PO TBEC
5.0000 mg | DELAYED_RELEASE_TABLET | Freq: Every day | ORAL | Status: DC | PRN
  Administered 2016-06-11: 5 mg via ORAL
  Filled 2016-06-10: qty 1

## 2016-06-10 MED ORDER — HEPARIN SODIUM (PORCINE) 5000 UNIT/ML IJ SOLN
5000.0000 [IU] | Freq: Three times a day (TID) | INTRAMUSCULAR | Status: AC
  Administered 2016-06-10 – 2016-06-11 (×5): 5000 [IU] via SUBCUTANEOUS
  Filled 2016-06-10 (×5): qty 1

## 2016-06-10 MED ORDER — MOMETASONE FURO-FORMOTEROL FUM 200-5 MCG/ACT IN AERO
2.0000 | INHALATION_SPRAY | Freq: Two times a day (BID) | RESPIRATORY_TRACT | Status: DC
  Filled 2016-06-10: qty 8.8

## 2016-06-10 MED ORDER — PIPERACILLIN-TAZOBACTAM 3.375 G IVPB 30 MIN
3.3750 g | Freq: Once | INTRAVENOUS | Status: AC
  Administered 2016-06-10: 3.375 g via INTRAVENOUS
  Filled 2016-06-10: qty 50

## 2016-06-10 MED ORDER — ACETAMINOPHEN 650 MG RE SUPP: 650.0000 mg | Freq: Four times a day (QID) | RECTAL | Status: DC | PRN

## 2016-06-10 MED ORDER — PIPERACILLIN-TAZOBACTAM 3.375 G IVPB
3.3750 g | Freq: Three times a day (TID) | INTRAVENOUS | Status: DC
  Administered 2016-06-10 – 2016-06-13 (×8): 3.375 g via INTRAVENOUS
  Filled 2016-06-10 (×11): qty 50

## 2016-06-10 MED ORDER — CLOPIDOGREL BISULFATE 75 MG PO TABS: 75.0000 mg | ORAL_TABLET | Freq: Every day | ORAL | Status: DC

## 2016-06-10 NOTE — ED Notes (Signed)
Floor RN unable to take report at this time.

## 2016-06-10 NOTE — Telephone Encounter (Signed)
Call came in this am stating pt's foot was peeling and looking very bad. I instructed schedulers to get pt an appt today. Dr. Jacqualyn Posey states he saw pt Friday, call pt and redirect to the ED he needs to be admitted. I spoke with pt's dtr, [name] and she said, "Good.", and requested I call pt's wife, [name] and redirect to the ED. I informed [name] of Dr. Leigh Aurora orders and She states she will take him there immediately.

## 2016-06-10 NOTE — H&P (Signed)
History and Physical    [name] Small [name] DOB: 04/02/1928 DOA: 06/10/2016  PCP: [name], MD Patient coming from: home  Chief Complaint: left great toe pain  HPI: [name] Small is a 82 y.o. male with medical history significant for diabetes type 2 chronic kidney disease, CAD, PVD, A. fib, CHF, bladder cancer came to the emergency department chief complaint worsening left second toe ulceration. Initial evaluation concerning for osteomylitiis.  Information is obtained from the patient and the chart. He states he was seen by podiatry 3 days ago and had the toe debrided. X-ray was done at that time which shows some changes concerning for osseous. Chart review indicates cultures taken at that time he was discharged with clindamycin and Cipro. Weekend wife reports skin began to slough off and was oozing blood. He denies any headache visual disturbances fever chills nausea vomiting. He denies any abdominal pain diarrhea dysuria hematuria frequency or urgency. He denies any pain in his lower extremity secondary to diabetic neuropathy. He also reports blood sugars running higher than usual. Wife reports over the last 2 months he's had a decreased appetite and has lost approximately 13 pounds unintentionally. He denies any chest pain palpitation shortness of breath.    ED Course: In the emergency department he's afebrile hemodynamically stable and not toxic. He is provided with vancomycin and Zosyn.  Review of Systems: As per HPI otherwise 10 point review of systems negative.   Ambulatory Status: He ambulates with a cane denies any recent falls is independent with ADLs  Past Medical History:  Diagnosis Date  . Arthritis   . Back pain    s/p lumbar injection 2014  . BENIGN PROSTATIC HYPERTROPHY 05/21/2007  . Bladder cancer ([place]) 10/2011  . CAD (coronary artery disease) CARDIOLOGIST- DR WALL - VISIT IN JUNE 2012 FOR CATH   nonobstructive by cath 6/12:  mid LAD 30%, proximal  obtuse marginal-2 30%, proximal RCA 20%, mid RCA 30-40%.  He had normal cardiac output and mildly elevated filling pressures but no significant pulmonary hypertension;   Echocardiogram in May 2012 demonstrated EF 50-55% and left atrial enlargement   . Cellulitis of left foot    Hospitalized in 2006  . CHF (congestive heart failure) ([place])    per family, issues in 2012  . Chronic kidney disease (CKD), stage III (moderate) 12/04/2007   FOLLOWED BY DR PATEL  . Chronic pain of lower extremity   . DIABETES MELLITUS, TYPE II 05/21/2007  . DIVERTICULOSIS, COLON 05/21/2007   pt denies  . Fatigue AGE-RELATED  . Gross hematuria    pt denies  . HYPERTENSION 05/21/2007  . Impaired hearing BILATERAL HEARING AIDS  . On home oxygen therapy    "2L at nighttime" (02/28/2016)  . PAD (peripheral artery disease) ([place])   . Psoriasis ELBOWS  . PSORIASIS, SCALP 10/25/2008    Past Surgical History:  Procedure Laterality Date  . AMPUTATION Left 03/27/2016   Procedure: partial first AMPUTATION RAY; LEFT;  Surgeon: Trula Slade, DPM;  Location: [facility];  Service: Podiatry;  Laterality: Left;  . APPENDECTOMY  1941  . CARDIAC CATHETERIZATION  04-24-11/  DR Rogue Jury ARIDA   MILD NONOBSTRUCTIVE CAD, NORMA CARDIAC OUTPUT  . CARDIOVASCULAR STRESS TEST  2007  . CATARACT EXTRACTION W/ INTRAOCULAR LENS  IMPLANT, BILATERAL Bilateral ~ 2010  . CYSTOSCOPY  12/25/2011   Procedure: CYSTOSCOPY;  Surgeon: Molli Hazard, MD;  Location: [facility];  Service: Urology;  Laterality: N/A;  needs intubation and to  be paralyzed   . CYSTOSCOPY W/ RETROGRADES  10/30/2011   Procedure: CYSTOSCOPY WITH RETROGRADE PYELOGRAM;  Surgeon: Molli Hazard, MD;  Location[facility];  Service: Urology;  Laterality: Bilateral;  CYSTOSCOPY POSS TURBT BILATERAL RETROGRADE PYLEOGRAM   . CYSTOSCOPY W/ RETROGRADES  11/30/2012   Procedure: CYSTOSCOPY WITH RETROGRADE PYELOGRAM;  Surgeon: Molli Hazard, MD;  Location: [facility];  Service: Urology;  Laterality: Bilateral;  Flexible cystoscopy.  . CYSTOSCOPY WITH BIOPSY  11/30/2012   Procedure: CYSTOSCOPY WITH BIOPSY;  Surgeon: Molli Hazard, MD;  Location: [facility];  Service: Urology;  Laterality: N/A;  . INCISION AND DRAINAGE FOOT Left ~ 2005   LEFT FOOT DUE TO INFECTION FROM  NAIL PUNCTURE INJURY  . PENILE PROSTHESIS IMPLANT  1990s  . PERIPHERAL VASCULAR CATHETERIZATION N/A 02/14/2016   Procedure: Abdominal Aortogram w/Lower Extremity;  Surgeon: Wellington Hampshire, MD;  Location: [facility];  Service: Cardiovascular;  Laterality: N/A;  . PERIPHERAL VASCULAR CATHETERIZATION Left 02/28/2016   Procedure: Peripheral Vascular Balloon Angioplasty;  Surgeon: Wellington Hampshire, MD;  Location: [facility];  Service: Cardiovascular;  Laterality: Left;  left peroneal and popliteal artery  . [place]  . TRANSURETHRAL RESECTION OF BLADDER TUMOR  10/30/2011   Procedure: TRANSURETHRAL RESECTION OF BLADDER TUMOR (TURBT);  Surgeon: Molli Hazard, MD;  Location: [facility];  Service: Urology;  Laterality: N/A;  . TRANSURETHRAL RESECTION OF BLADDER TUMOR  12/25/2011   Procedure: TRANSURETHRAL RESECTION OF BLADDER TUMOR (TURBT);  Surgeon: Molli Hazard, MD;  Location: [facility];  Service: Urology;  Laterality: N/A;  need long gyrus instruments   . VASECTOMY  1990s    Social History   Social History  . Marital status: Married    Spouse name: N/A  . Number of children: 4  . Years of education: N/A   Occupational History  . retired      [employer]	[place].   Social History Main Topics  . Smoking status: Former Smoker    Packs/day: 2.00    Years: 22.00    Types: Cigarettes    Quit date: 11/11/1968  . Smokeless tobacco: Never Used  . Alcohol use 1.2 oz/week    2 Glasses of wine per week     Comment: occassional  . Drug  use: No  . Sexual activity: Not Currently   Other Topics Concern  . Not on file   Social History Narrative   Retired Event organiser.   Active with golf.   [place] vet.  [place] [phone].      Allergies  Allergen Reactions  . Actos [Pioglitazone Hydrochloride] Other (See Comments)    Held 2012 bladder cancer  . Aspirin Other (See Comments)    Held 2012 due to hematuria and bruising.   . Ace Inhibitors Cough  . Gabapentin     Upset stomach and diarrhea - tolerates low doses  . Lyrica [Pregabalin] Other (See Comments)    swelling  . Pravastatin Other (See Comments)    Swelling of feet  . Bactrim Rash and Other (See Comments)    Rash, presumed allergy  . Sulfa Drugs Cross Reactors Rash    Family History  Problem Relation Age of Onset  . Diabetes Mother   . Heart disease Brother   . Heart disease Brother   . Heart disease Brother   . Heart disease Brother   . Heart disease Brother   .  Heart disease Brother   . Lung cancer Brother    He has 13 siblings all except one are deceased. Collectively their medical history is positive for diabetes heart disease lung cancer stroke Prior to Admission medications   Medication Sig Start Date End Date Taking? Authorizing Provider  acetaminophen (TYLENOL) 500 MG tablet Take 1,000 mg by mouth every 8 (eight) hours as needed for mild pain, moderate pain or headache.   Yes Historical Provider, MD  amLODipine (NORVASC) 5 MG tablet Take 1 tablet (5 mg total) by mouth daily. 11/10/15  Yes Tonia Ghent, MD  atorvastatin (LIPITOR) 10 MG tablet Take 1 tablet (10 mg total) by mouth daily. 02/23/16  Yes Wellington Hampshire, MD  budesonide-formoterol (SYMBICORT) 160-4.5 MCG/ACT inhaler Inhale 2 puffs into the lungs 2 (two) times daily as needed (shortness of breath).  01/21/14  Yes Tonia Ghent, MD  ciprofloxacin (CIPRO) 500 MG tablet Take 1 tablet by mouth 2 (two) times daily. 03/21/16  Yes Historical Provider, MD  clindamycin (CLEOCIN) 300  MG capsule Take 1 capsule (300 mg total) by mouth 3 (three) times daily. 06/07/16  Yes Trula Slade, DPM  clobetasol (TEMOVATE) 0.05 % external solution Apply 1 application topically daily.   Yes Historical Provider, MD  clopidogrel (PLAVIX) 75 MG tablet Take 1 tablet by mouth  daily 02/08/16  Yes Tonia Ghent, MD  collagenase (SANTYL) ointment Apply 1 application topically daily.   Yes Historical Provider, MD  furosemide (LASIX) 40 MG tablet TAKE 1 TABLET BY MOUTH  EVERY MONDAY, WEDNESDAY,  AND FRIDAY 12/01/15  Yes Tonia Ghent, MD  gabapentin (NEURONTIN) 100 MG capsule Take 100 mg by mouth 3 (three) times daily.   Yes Historical Provider, MD  glipiZIDE (GLUCOTROL XL) 5 MG 24 hr tablet Take 1 tablet by mouth  daily with breakfast 04/26/16  Yes Tonia Ghent, MD  insulin glargine (LANTUS) 100 UNIT/ML injection Inject 0.1 mLs (10 Units total) into the skin daily. 04/23/16  Yes Tonia Ghent, MD  ipratropium-albuterol (DUONEB) 0.5-2.5 (3) MG/3ML SOLN Take 3 mLs by nebulization every 6 (six) hours as needed. 03/29/16  Yes Oswald Hillock, MD  polyethylene glycol (MIRALAX / GLYCOLAX) packet Take 17 g by mouth daily as needed. 03/29/16  Yes Oswald Hillock, MD  tiotropium (SPIRIVA) 18 MCG inhalation capsule Place 18 mcg into inhaler and inhale daily.   Yes Historical Provider, MD    Physical Exam: Vitals:   06/10/16 1115 06/10/16 1145 06/10/16 1200 06/10/16 1215  BP: 165/71 135/67 138/78 126/70  Pulse: 67 83 66 65  Resp:      Temp:      TempSrc:      SpO2: 99% 97% 96% 96%  Weight:      Height:         General:  Appears calm and comfortable, well-nourished Eyes:  PERRL, EOMI, normal lids, iris ENT:  grossly normal hearing, lips & tongue, mucous membranes of his mouth are pink only slightly dry Neck:  no LAD, masses or thyromegaly Cardiovascular:  RRR, no m/r/g. No LE edema. Pedal pulses present and palpable Respiratory:  Breath sounds slightly diminished throughout. Faint end-expiratory  wheezing. No crackles no increased work of breathing Abdomen:  soft, ntnd, obese positive bowel sounds throughout no guarding or rebounding Skin:  no rash or induration seen on limited exam, second toe left foot see photo noted in emergency progress note. Musculoskeletal:  grossly normal tone BUE/BLE, good ROM, no bony abnormality, left second  toe somewhat gray with Psychiatric:  grossly normal mood and affect, speech fluent and appropriate, AOx3 Neurologic:  CN 2-12 grossly intact, moves all extremities in coordinated fashion, sensation intact  Labs on Admission: I have personally reviewed following labs and imaging studies  CBC:  Recent Labs Lab 06/10/16 1149  WBC 9.0  NEUTROABS 6.5  HGB 13.9  HCT 42.4  MCV 97.7  PLT 99991111   Basic Metabolic Panel:  Recent Labs Lab 06/10/16 1133  NA 137  K 4.8  CL 99*  CO2 30  GLUCOSE 270*  BUN 28*  CREATININE 1.84*  CALCIUM 9.2   GFR: Estimated Creatinine Clearance: 34.1 mL/min (by C-G formula based on SCr of 1.84 mg/dL). Liver Function Tests: No results for input(s): AST, ALT, ALKPHOS, BILITOT, PROT, ALBUMIN in the last 168 hours. No results for input(s): LIPASE, AMYLASE in the last 168 hours. No results for input(s): AMMONIA in the last 168 hours. Coagulation Profile: No results for input(s): INR, PROTIME in the last 168 hours. Cardiac Enzymes: No results for input(s): CKTOTAL, CKMB, CKMBINDEX, TROPONINI in the last 168 hours. BNP (last 3 results) No results for input(s): PROBNP in the last 8760 hours. HbA1C: No results for input(s): HGBA1C in the last 72 hours. CBG:  Recent Labs Lab 06/10/16 1028  GLUCAP 268*   Lipid Profile: No results for input(s): CHOL, HDL, LDLCALC, TRIG, CHOLHDL, LDLDIRECT in the last 72 hours. Thyroid Function Tests: No results for input(s): TSH, T4TOTAL, FREET4, T3FREE, THYROIDAB in the last 72 hours. Anemia Panel: No results for input(s): VITAMINB12, FOLATE, FERRITIN, TIBC, IRON, RETICCTPCT in  the last 72 hours. Urine analysis:    Component Value Date/Time   COLORURINE YELLOW 01/18/2014 0719   APPEARANCEUR CLEAR 01/18/2014 0719   LABSPEC 1.020 01/18/2014 0719   PHURINE 6.0 01/18/2014 0719   GLUCOSEU >1000 (A) 01/18/2014 0719   HGBUR NEGATIVE 01/18/2014 0719   HGBUR trace-intact 11/25/2007 0937   BILIRUBINUR NEGATIVE 01/18/2014 0719   KETONESUR NEGATIVE 01/18/2014 0719   PROTEINUR 30 (A) 01/18/2014 0719   UROBILINOGEN 0.2 01/18/2014 0719   NITRITE NEGATIVE 01/18/2014 0719   LEUKOCYTESUR NEGATIVE 01/18/2014 0719    Creatinine Clearance: Estimated Creatinine Clearance: 34.1 mL/min (by C-G formula based on SCr of 1.84 mg/dL).  Sepsis Labs: @LABRCNTIP (procalcitonin:4,lacticidven:4) )No results found for this or any previous visit (from the past 240 hour(s)).   Radiological Exams on Admission: No results found.  EKG: Sinus rhythm Atrial premature complex  Assessment/Plan Principal Problem:   Osteomyelitis (HCC) Active Problems:   Diabetes with neurologic complications (HCC)   Chronic kidney disease, stage III (moderate)   BPH (benign prostatic hyperplasia)   CAD (coronary artery disease) of artery bypass graft   Bladder cancer (HCC)   Atrial fibrillation (HCC)   HTN (hypertension)   Cellulitis   History of COPD   #1. Osteomyelitis left second toe status post debridement with biopsy 3 days ago. X-ray noted at that time concerning for osteopenia. Has been on antibiotics. He is afebrile hemodynamically stable and not toxic appearing. No leukocytosis. Case discussed with podiatry Dr. Earleen Newport per Dr. Barbaraann Faster who opined amputation likely. Schedule for August 2. -Admit to medical bed -Continue Vanco and Zosyn -Hold Plavix -Follow blood cultures -Obtain a chest x-ray and EKG -INR PT  #2. Diabetes. Serum glucose 270 on admission. Medications include Lantus. -Continue Lantus -Hemoglobin A1c -Sliding scale insulin for optimal control  #3. Hypertension. Controlled in  the emergency department. Home medications include amlodipine, Lasix -Continue home meds -Monitor  #4. A. Fib.  chadvasc score 5. On Plavix. -Hold Plavix for now -Provide heparin last dose August 2 at midnight  #5. Chronic kidney disease. Stage III. Creatinine 1.8 on admission. Chart review indicates this is very close to baseline -Gentle IV fluids -Hold nephrotoxins -Monitor urine output -Monitor  #6 CAD. Nonobstructive. No chest pain. EKG pending. Right and left heart cath in June 2012 -Continue home meds  #7. History of bladder cancer. Status post chemotherapy  8. COPD. Not on home oxygen. Family reports he was on home oxygen but no longer needs it after having scheduled breathing treatments at discharge last hospitalization. Home medications include Symbicort, duonebs, spiriva -Stable at baseline -Obtain a chest x-ray -Continue home meds    DVT prophylaxis: heparin  Code Status: full Family Communication: wife and daughter at bedside  Disposition Plan: home  Consults called: podiatry  Admission status: inpatient    Radene Gunning MD [facility]  If 7PM-7AM, please contact night-coverage [email or web address] Password [email or web address]  06/10/2016, 1:48 PM

## 2016-06-10 NOTE — Progress Notes (Addendum)
Pharmacy Antibiotic Note  [name] Small is a 82 y.o. male admitted on 06/10/2016 with cellulitis/osteomyelitis.  Pharmacy has been consulted for vancomycin and Zosyn dosing.  Vancomycin 1g IV x1 and Zosyn 3.375g IV x1 ordered by EDP.  Plan: Vancomycin 1250mg  IV every 24 hours.  Goal trough 15-20 mcg/mL. Zosyn 3.375g IV q8h EI starting at 1400  Height: 6\' 2"  (188 cm) Weight: 200 lb (90.7 kg) IBW/kg (Calculated) : 82.2  Temp (24hrs), Avg:97.7 F (36.5 C), Min:97.7 F (36.5 C), Max:97.7 F (36.5 C)  No results for input(s): WBC, CREATININE, LATICACIDVEN, VANCOTROUGH, VANCOPEAK, VANCORANDOM, GENTTROUGH, GENTPEAK, GENTRANDOM, TOBRATROUGH, TOBRAPEAK, TOBRARND, AMIKACINPEAK, AMIKACINTROU, AMIKACIN in the last 168 hours.  CrCl cannot be calculated (Patient's most recent lab result is older than the maximum 21 days allowed.).    Allergies  Allergen Reactions  . Actos [Pioglitazone Hydrochloride] Other (See Comments)    Held 2012 bladder cancer  . Aspirin Other (See Comments)    Held 2012 due to hematuria and bruising.   . Ace Inhibitors Cough  . Gabapentin     Upset stomach and diarrhea - tolerates low doses  . Lyrica [Pregabalin] Other (See Comments)    swelling  . Pravastatin Other (See Comments)    Swelling of feet  . Bactrim Rash and Other (See Comments)    Rash, presumed allergy  . Sulfa Drugs Cross Reactors Rash    Antimicrobials this admission: vanc 7/31 >>  zosyn 7/31 >>  Dose adjustments this admission: n/a  Microbiology results: 7/31 BCx: sent   Thank you for allowing pharmacy to be a part of this patient's care.  Mahlik Lenn D. Bejamin Hackbart, PharmD, BCPS Clinical Pharmacist Pager: [phone] 06/10/2016 11:58 AM

## 2016-06-10 NOTE — ED Notes (Signed)
CBG 268.  This RN completed task.

## 2016-06-10 NOTE — ED Provider Notes (Signed)
[facility] DEPT Provider Note   CSN: [id number] Arrival date & time: 06/10/16  [id number]  First Provider Contact:  First MD Initiated Contact with Patient 06/10/16 1047      History   Chief Complaint Chief Complaint  Patient presents with  . Toe Pain    HPI AMYAS ERDMANN is a 82 y.o. male.  The history is provided by the patient, the spouse and medical records. No language interpreter was used.  Toe Pain  Pertinent negatives include no abdominal pain, arthralgias, chest pain, chills, congestion, coughing, fever, headaches, myalgias, nausea or vomiting.  ADRYEL LAIBLE is a 82 y.o. male  with a PMH of CAD, PAD, CHF, DM2, HTN who presents to the Emergency Department complaining of left 2nd toe ulceration. Patient was seen by his podiatrist, Dr. Earleen Newport, on 7/28 (3 days ago) for this issue. I have visit, x-ray was obtained which showed cortical changes suggestive of osteo-. The wound was treated and cultures sent. It appears that a culture was also taken of the bone itself. He has been taking clindamycin and Cipro as directed, but wife at bedside states that the skin is starting to hold off and color is changing (becoming darker). He was told to go directly to the emergency room if symptoms worsen, which is why he is here today. He denies fever, chills. He states that he has decreased sensation of lower extremities 2/2 DM, therefore has not been pain. Sugars running in low 200's.    Past Medical History:  Diagnosis Date  . Arthritis   . Back pain    s/p lumbar injection 2014  . BENIGN PROSTATIC HYPERTROPHY 05/21/2007  . Bladder cancer ([place]) 10/2011  . CAD (coronary artery disease) CARDIOLOGIST- DR WALL - VISIT IN JUNE 2012 FOR CATH   nonobstructive by cath 6/12:  mid LAD 30%, proximal obtuse marginal-2 30%, proximal RCA 20%, mid RCA 30-40%.  He had normal cardiac output and mildly elevated filling pressures but no significant pulmonary hypertension;   Echocardiogram in May 2012  demonstrated EF 50-55% and left atrial enlargement   . Cellulitis of left foot    Hospitalized in 2006  . CHF (congestive heart failure) ([place])    per family, issues in 2012  . Chronic kidney disease (CKD), stage III (moderate) 12/04/2007   FOLLOWED BY DR PATEL  . Chronic pain of lower extremity   . DIABETES MELLITUS, TYPE II 05/21/2007  . DIVERTICULOSIS, COLON 05/21/2007   pt denies  . Fatigue AGE-RELATED  . Gross hematuria    pt denies  . HYPERTENSION 05/21/2007  . Impaired hearing BILATERAL HEARING AIDS  . On home oxygen therapy    "2L at nighttime" (02/28/2016)  . PAD (peripheral artery disease) ([place])   . Psoriasis ELBOWS  . PSORIASIS, SCALP 10/25/2008    Patient Active Problem List   Diagnosis Date Noted  . Right second toe ulcer ([place]) 05/20/2016  . Diabetic ulcer of left foot ([place]) 03/24/2016  . Diabetic ulcer of left foot associated with diabetes mellitus due to underlying condition ([place])   . Weakness 03/18/2016  . Mood disorder ([place]) 03/18/2016  . Groin hematoma   . Chronic renal failure   . Critical lower limb ischemia 02/28/2016  . Peripheral arterial disease ([place]) 10/17/2015  . Edema 07/19/2015  . Claudication ([place]) 01/31/2015  . DOE (dyspnea on exertion) 01/31/2015  . Hyperlipidemia 01/31/2015  . HTN (hypertension) 01/31/2015  . Chronic diastolic CHF (congestive heart failure) ([place]) 01/21/2014  . Seborrheic keratoses 04/30/2013  .  CHF (congestive heart failure) ([place]) 12/16/2012  . Atrial fibrillation ([place]) 12/16/2012  . Hyperkalemia 11/27/2011  . Bladder cancer ([place]) 09/22/2011  . Leg cramps 08/28/2011  . Cough 05/26/2011  . CAD (coronary artery disease) of artery bypass graft 05/09/2011  . Dyspnea on exertion 04/19/2011  . PSORIASIS, SCALP 10/25/2008  . Chronic kidney disease, stage III (moderate) 12/04/2007  . Diabetes with neurologic complications ([place]) 99991111  . DIVERTICULOSIS, COLON 05/21/2007  . BPH (benign prostatic hyperplasia) 05/21/2007     Past Surgical History:  Procedure Laterality Date  . AMPUTATION Left 03/27/2016   Procedure: partial first AMPUTATION RAY; LEFT;  Surgeon: Trula Slade, DPM;  Location: [facility];  Service: Podiatry;  Laterality: Left;  . APPENDECTOMY  1941  . CARDIAC CATHETERIZATION  04-24-11/  DR Rogue Jury ARIDA   MILD NONOBSTRUCTIVE CAD, NORMA CARDIAC OUTPUT  . CARDIOVASCULAR STRESS TEST  2007  . CATARACT EXTRACTION W/ INTRAOCULAR LENS  IMPLANT, BILATERAL Bilateral ~ 2010  . CYSTOSCOPY  12/25/2011   Procedure: CYSTOSCOPY;  Surgeon: Molli Hazard, MD;  Location: [facility];  Service: Urology;  Laterality: N/A;  needs intubation and to be paralyzed   . CYSTOSCOPY W/ RETROGRADES  10/30/2011   Procedure: CYSTOSCOPY WITH RETROGRADE PYELOGRAM;  Surgeon: Molli Hazard, MD;  Location[facility];  Service: Urology;  Laterality: Bilateral;  CYSTOSCOPY POSS TURBT BILATERAL RETROGRADE PYLEOGRAM   . CYSTOSCOPY W/ RETROGRADES  11/30/2012   Procedure: CYSTOSCOPY WITH RETROGRADE PYELOGRAM;  Surgeon: Molli Hazard, MD;  Location: [facility];  Service: Urology;  Laterality: Bilateral;  Flexible cystoscopy.  . CYSTOSCOPY WITH BIOPSY  11/30/2012   Procedure: CYSTOSCOPY WITH BIOPSY;  Surgeon: Molli Hazard, MD;  Location: [facility];  Service: Urology;  Laterality: N/A;  . INCISION AND DRAINAGE FOOT Left ~ 2005   LEFT FOOT DUE TO INFECTION FROM  NAIL PUNCTURE INJURY  . PENILE PROSTHESIS IMPLANT  1990s  . PERIPHERAL VASCULAR CATHETERIZATION N/A 02/14/2016   Procedure: Abdominal Aortogram w/Lower Extremity;  Surgeon: Wellington Hampshire, MD;  Location: [facility];  Service: Cardiovascular;  Laterality: N/A;  . PERIPHERAL VASCULAR CATHETERIZATION Left 02/28/2016   Procedure: Peripheral Vascular Balloon Angioplasty;  Surgeon: Wellington Hampshire, MD;  Location: [facility];  Service: Cardiovascular;  Laterality: Left;  left  peroneal and popliteal artery  . [place]  . TRANSURETHRAL RESECTION OF BLADDER TUMOR  10/30/2011   Procedure: TRANSURETHRAL RESECTION OF BLADDER TUMOR (TURBT);  Surgeon: Molli Hazard, MD;  Location: [facility];  Service: Urology;  Laterality: N/A;  . TRANSURETHRAL RESECTION OF BLADDER TUMOR  12/25/2011   Procedure: TRANSURETHRAL RESECTION OF BLADDER TUMOR (TURBT);  Surgeon: Molli Hazard, MD;  Location: [facility];  Service: Urology;  Laterality: N/A;  need long gyrus instruments   . VASECTOMY  1990s       Home Medications    Prior to Admission medications   Medication Sig Start Date End Date Taking? Authorizing Provider  acetaminophen (TYLENOL) 500 MG tablet Take 1,000 mg by mouth every 8 (eight) hours as needed for mild pain, moderate pain or headache.   Yes Historical Provider, MD  amLODipine (NORVASC) 5 MG tablet Take 1 tablet (5 mg total) by mouth daily. 11/10/15  Yes Tonia Ghent, MD  atorvastatin (LIPITOR) 10 MG tablet Take 1 tablet (10 mg total) by mouth daily. 02/23/16  Yes Wellington Hampshire, MD  budesonide-formoterol (SYMBICORT) 160-4.5 MCG/ACT  inhaler Inhale 2 puffs into the lungs 2 (two) times daily as needed (shortness of breath).  01/21/14  Yes Tonia Ghent, MD  ciprofloxacin (CIPRO) 500 MG tablet Take 1 tablet by mouth 2 (two) times daily. 03/21/16  Yes Historical Provider, MD  clindamycin (CLEOCIN) 300 MG capsule Take 1 capsule (300 mg total) by mouth 3 (three) times daily. 06/07/16  Yes Trula Slade, DPM  clobetasol (TEMOVATE) 0.05 % external solution Apply 1 application topically daily.   Yes Historical Provider, MD  clopidogrel (PLAVIX) 75 MG tablet Take 1 tablet by mouth  daily 02/08/16  Yes Tonia Ghent, MD  collagenase (SANTYL) ointment Apply 1 application topically daily.   Yes Historical Provider, MD  furosemide (LASIX) 40 MG tablet TAKE 1 TABLET BY MOUTH  EVERY MONDAY, WEDNESDAY,   AND FRIDAY 12/01/15  Yes Tonia Ghent, MD  gabapentin (NEURONTIN) 100 MG capsule Take 100 mg by mouth 3 (three) times daily.   Yes Historical Provider, MD  glipiZIDE (GLUCOTROL XL) 5 MG 24 hr tablet Take 1 tablet by mouth  daily with breakfast 04/26/16  Yes Tonia Ghent, MD  insulin glargine (LANTUS) 100 UNIT/ML injection Inject 0.1 mLs (10 Units total) into the skin daily. 04/23/16  Yes Tonia Ghent, MD  ipratropium-albuterol (DUONEB) 0.5-2.5 (3) MG/3ML SOLN Take 3 mLs by nebulization every 6 (six) hours as needed. 03/29/16  Yes Oswald Hillock, MD  polyethylene glycol (MIRALAX / GLYCOLAX) packet Take 17 g by mouth daily as needed. 03/29/16  Yes Oswald Hillock, MD  tiotropium (SPIRIVA) 18 MCG inhalation capsule Place 18 mcg into inhaler and inhale daily.   Yes Historical Provider, MD    Family History Family History  Problem Relation Age of Onset  . Diabetes Mother   . Heart disease Brother   . Heart disease Brother   . Heart disease Brother   . Heart disease Brother   . Heart disease Brother   . Heart disease Brother   . Lung cancer Brother     Social History Social History  Substance Use Topics  . Smoking status: Former Smoker    Packs/day: 2.00    Years: 22.00    Types: Cigarettes    Quit date: 11/11/1968  . Smokeless tobacco: Never Used  . Alcohol use 1.2 oz/week    2 Glasses of wine per week     Comment: occassional     Allergies   Actos [pioglitazone hydrochloride]; Aspirin; Ace inhibitors; Gabapentin; Lyrica [pregabalin]; Pravastatin; Bactrim; and Sulfa drugs cross reactors   Review of Systems Review of Systems  Constitutional: Negative for chills and fever.  HENT: Negative for congestion.   Eyes: Negative for visual disturbance.  Respiratory: Negative for cough and shortness of breath.   Cardiovascular: Negative for chest pain and palpitations.  Gastrointestinal: Negative for abdominal pain, nausea and vomiting.  Genitourinary: Negative for dysuria.    Musculoskeletal: Negative for arthralgias and myalgias.  Skin: Positive for color change and wound.  Allergic/Immunologic: Positive for immunocompromised state.  Neurological: Negative for headaches.     Physical Exam Updated Vital Signs BP 126/70   Pulse 65   Temp 97.7 F (36.5 C) (Oral)   Resp 18   Ht 6\' 2"  (1.88 m)   Wt 90.7 kg   SpO2 96%   BMI 25.68 kg/m   Physical Exam  Constitutional: He is oriented to person, place, and time. He appears well-developed and well-nourished. No distress.  HENT:  Head: Normocephalic and atraumatic.  Cardiovascular:  Normal rate and regular rhythm.   Pulmonary/Chest: Effort normal and breath sounds normal. No respiratory distress.  Abdominal: Soft. Bowel sounds are normal. He exhibits no distension. There is no tenderness.  Musculoskeletal: He exhibits no edema.  Neurological: He is alert and oriented to person, place, and time.  Decreased sensation to bilateral lower extremities which patient states is baseline.  Skin: Skin is warm and dry.  See image below.   Nursing note and vitals reviewed.      ED Treatments / Results  Labs (all labs ordered are listed, but only abnormal results are displayed) Labs Reviewed  BASIC METABOLIC PANEL - Abnormal; Notable for the following:       Result Value   Chloride 99 (*)    Glucose, Bld 270 (*)    BUN 28 (*)    Creatinine, Ser 1.84 (*)    GFR calc non Af Amer 32 (*)    GFR calc Af Amer 37 (*)    All other components within normal limits  CBG MONITORING, ED - Abnormal; Notable for the following:    Glucose-Capillary 268 (*)    All other components within normal limits  CULTURE, BLOOD (ROUTINE X 2)  CULTURE, BLOOD (ROUTINE X 2)  CBC WITH DIFFERENTIAL/PLATELET    EKG  EKG Interpretation None       Radiology No results found.  Procedures Procedures (including critical care time)  Medications Ordered in ED Medications  vancomycin (VANCOCIN) IVPB 1000 mg/200 mL premix (not  administered)  piperacillin-tazobactam (ZOSYN) IVPB 3.375 g (3.375 g Intravenous New Bag/Given 06/10/16 1239)  vancomycin (VANCOCIN) IVPB 1000 mg/200 mL premix (not administered)     Initial Impression / Assessment and Plan / ED Course  I have reviewed the triage vital signs and the nursing notes.  Pertinent labs & imaging results that were available during my care of the patient were reviewed by me and considered in my medical decision making (see chart for details).  Clinical Course   PATRYCK DISTASI presents with worsening wound of left 2nd toe. Seen by podiatry on Friday (3 days ago) - chart and images from this visit reviewed. X-ray on Friday showed changes suggestive of osteo and patient placed on clinda and cipro. Given sxs worsening despite home ABX, will obtain blood cx's and start on vanc/zosyn IV with likely admission. Labs reviewed. Imaging from Friday reviewed. No systemic symptoms. Afebrile with reassuring vitals. Does not meet SIRS.   Admit to hospitalist for further evaluation. Of note, bone and wound cultures were performed at podiatrist on Friday which could aide in ABX choice and inpatient care.   Patient discussed with Dr. Billy Fischer who agrees with treatment plan.    Final Clinical Impressions(s) / ED Diagnoses   Final diagnoses:  Osteomyelitis of left foot, unspecified chronicity [facility])    New Prescriptions New Prescriptions   No medications on file     [facility] Tenise Stetler, PA-C 06/10/16 1257    Gareth Morgan, MD 06/14/16 1150

## 2016-06-10 NOTE — Consult Note (Signed)
Reason for Consult:Osteomyelitis/toe amputation  Referring Physician: Dr. Elizebeth Brooking is an 82 y.o. male.  HPI: [name] Small was admitted to the hospital today for left toe osteomyelitis, ulceration. I last saw him in the office on Friday he was found to have a blister with a wound did probe to bone. He was started on clindamycin and ciprofloxacin at that time. However overnight no area did worsen he call the office as instructed to the emergency room for which he was admitted. He was on vancomycin and Zosyn. He denies any systemic complaints such as fevers, chills, nausea, vomiting this time.  Past Medical History:  Diagnosis Date  . Arthritis   . Back pain    s/p lumbar injection 2014  . BENIGN PROSTATIC HYPERTROPHY 05/21/2007  . Bladder cancer ([place]) 10/2011  . CAD (coronary artery disease) CARDIOLOGIST- DR WALL - VISIT IN JUNE 2012 FOR CATH   nonobstructive by cath 6/12:  mid LAD 30%, proximal obtuse marginal-2 30%, proximal RCA 20%, mid RCA 30-40%.  He had normal cardiac output and mildly elevated filling pressures but no significant pulmonary hypertension;   Echocardiogram in May 2012 demonstrated EF 50-55% and left atrial enlargement   . Cellulitis of left foot    Hospitalized in 2006  . CHF (congestive heart failure) ([place])    per family, issues in 2012  . Chronic kidney disease (CKD), stage III (moderate) 12/04/2007   FOLLOWED BY DR PATEL  . Chronic pain of lower extremity   . DIABETES MELLITUS, TYPE II 05/21/2007  . DIVERTICULOSIS, COLON 05/21/2007   pt denies  . Fatigue AGE-RELATED  . Gross hematuria    pt denies  . HYPERTENSION 05/21/2007  . Impaired hearing BILATERAL HEARING AIDS  . On home oxygen therapy    "2L at nighttime" (02/28/2016)  . PAD (peripheral artery disease) ([place])   . Psoriasis ELBOWS  . PSORIASIS, SCALP 10/25/2008    Past Surgical History:  Procedure Laterality Date  . AMPUTATION Left 03/27/2016   Procedure: partial first AMPUTATION RAY; LEFT;   Surgeon: Trula Slade, DPM;  Location: [facility];  Service: Podiatry;  Laterality: Left;  . APPENDECTOMY  1941  . CARDIAC CATHETERIZATION  04-24-11/  DR Rogue Jury AR[place]   MILD NONOBSTRUCTIVE CAD, NORMA CARDIAC OUTPUT  . CARDIOVASCULAR STRESS TEST  2007  . CATARACT EXTRACTION W/ INTRAOCULAR LENS  IMPLANT, BILATERAL Bilateral ~ 2010  . CYSTOSCOPY  12/25/2011   Procedure: CYSTOSCOPY;  Surgeon: Molli Hazard, MD;  Location: [facility];  Service: Urology;  Laterality: N/A;  needs intubation and to be paralyzed   . CYSTOSCOPY W/ RETROGRADES  10/30/2011   Procedure: CYSTOSCOPY WITH RETROGRADE PYELOGRAM;  Surgeon: Molli Hazard, MD;  Location[facility];  Service: Urology;  Laterality: Bilateral;  CYSTOSCOPY POSS TURBT BILATERAL RETROGRADE PYLEOGRAM   . CYSTOSCOPY W/ RETROGRADES  11/30/2012   Procedure: CYSTOSCOPY WITH RETROGRADE PYELOGRAM;  Surgeon: Molli Hazard, MD;  Location: [facility];  Service: Urology;  Laterality: Bilateral;  Flexible cystoscopy.  . CYSTOSCOPY WITH BIOPSY  11/30/2012   Procedure: CYSTOSCOPY WITH BIOPSY;  Surgeon: Molli Hazard, MD;  Location: [facility];  Service: Urology;  Laterality: N/A;  . INCISION AND DRAINAGE FOOT Left ~ 2005   LEFT FOOT DUE TO INFECTION FROM  NAIL PUNCTURE INJURY  . PENILE PROSTHESIS IMPLANT  1990s  . PERIPHERAL VASCULAR CATHETERIZATION N/A 02/14/2016   Procedure: Abdominal Aortogram w/Lower Extremity;  Surgeon: Wellington Hampshire, MD;  Location: [place] [facility];  Service: Cardiovascular;  Laterality: N/A;  . PERIPHERAL VASCULAR CATHETERIZATION Left 02/28/2016   Procedure: Peripheral Vascular Balloon Angioplasty;  Surgeon: Wellington Hampshire, MD;  Location: [place] [facility];  Service: Cardiovascular;  Laterality: Left;  left peroneal and popliteal artery  . [place]  . TRANSURETHRAL RESECTION OF BLADDER TUMOR  10/30/2011    Procedure: TRANSURETHRAL RESECTION OF BLADDER TUMOR (TURBT);  Surgeon: Molli Hazard, MD;  Location: [facility];  Service: Urology;  Laterality: N/A;  . TRANSURETHRAL RESECTION OF BLADDER TUMOR  12/25/2011   Procedure: TRANSURETHRAL RESECTION OF BLADDER TUMOR (TURBT);  Surgeon: Molli Hazard, MD;  Location: [facility];  Service: Urology;  Laterality: N/A;  need long gyrus instruments   . VASECTOMY  1990s    Family History  Problem Relation Age of Onset  . Diabetes Mother   . Heart disease Brother   . Heart disease Brother   . Heart disease Brother   . Heart disease Brother   . Heart disease Brother   . Heart disease Brother   . Lung cancer Brother     Social History:  reports that he quit smoking about 47 years ago. His smoking use included Cigarettes. He has a 44.00 pack-year smoking history. He has never used smokeless tobacco. He reports that he drinks about 1.2 oz of alcohol per week . He reports that he does not use drugs.  Allergies:  Allergies  Allergen Reactions  . Actos [Pioglitazone Hydrochloride] Other (See Comments)    Held 2012 bladder cancer  . Aspirin Other (See Comments)    Held 2012 due to hematuria and bruising.   . Ace Inhibitors Cough  . Gabapentin     Upset stomach and diarrhea - tolerates low doses  . Lyrica [Pregabalin] Other (See Comments)    swelling  . Pravastatin Other (See Comments)    Swelling of feet  . Bactrim Rash and Other (See Comments)    Rash, presumed allergy  . Sulfa Drugs Cross Reactors Rash    Medications: I have reviewed the patient's current medications.  Results for orders placed or performed during the hospital encounter of 06/10/16 (from the past 48 hour(s))  CBG monitoring, ED     Status: Abnormal   Collection Time: 06/10/16 10:28 AM  Result Value Ref Range   Glucose-Capillary 268 (H) 65 - 99 mg/dL  Basic metabolic panel     Status: Abnormal   Collection Time: 06/10/16 11:33 AM   Result Value Ref Range   Sodium 137 135 - 145 mmol/L   Potassium 4.8 3.5 - 5.1 mmol/L   Chloride 99 (L) 101 - 111 mmol/L   CO2 30 22 - 32 mmol/L   Glucose, Bld 270 (H) 65 - 99 mg/dL   BUN 28 (H) 6 - 20 mg/dL   Creatinine, Ser 1.84 (H) 0.61 - 1.24 mg/dL   Calcium 9.2 8.9 - 10.3 mg/dL   GFR calc non Af Amer 32 (L) >60 mL/min   GFR calc Af Amer 37 (L) >60 mL/min    Comment: (NOTE) The eGFR has been calculated using the CKD EPI equation. This calculation has not been validated in all clinical situations. eGFR's persistently <60 mL/min signify possible Chronic Kidney Disease.    Anion gap 8 5 - 15  CBC with Differential     Status: None   Collection Time: 06/10/16 11:49 AM  Result Value Ref Range   WBC 9.0 4.0 -  10.5 K/uL   RBC 4.34 4.22 - 5.81 MIL/uL   Hemoglobin 13.9 13.0 - 17.0 g/dL   HCT 42.4 39.0 - 52.0 %   MCV 97.7 78.0 - 100.0 fL   MCH 32.0 26.0 - 34.0 pg   MCHC 32.8 30.0 - 36.0 g/dL   RDW 12.8 11.5 - 15.5 %   Platelets 319 150 - 400 K/uL   Neutrophils Relative % 73 %   Neutro Abs 6.5 1.7 - 7.7 K/uL   Lymphocytes Relative 14 %   Lymphs Abs 1.3 0.7 - 4.0 K/uL   Monocytes Relative 9 %   Monocytes Absolute 0.8 0.1 - 1.0 K/uL   Eosinophils Relative 3 %   Eosinophils Absolute 0.3 0.0 - 0.7 K/uL   Basophils Relative 1 %   Basophils Absolute 0.1 0.0 - 0.1 K/uL  APTT     Status: None   Collection Time: 06/10/16  1:30 PM  Result Value Ref Range   aPTT 34 24 - 36 seconds  Protime-INR     Status: None   Collection Time: 06/10/16  1:30 PM  Result Value Ref Range   Prothrombin Time 14.1 11.4 - 15.2 seconds   INR 1.09   Glucose, capillary     Status: Abnormal   Collection Time: 06/10/16  2:15 PM  Result Value Ref Range   Glucose-Capillary 225 (H) 65 - 99 mg/dL  Glucose, capillary     Status: Abnormal   Collection Time: 06/10/16  4:28 PM  Result Value Ref Range   Glucose-Capillary 324 (H) 65 - 99 mg/dL    Dg Chest Port 1 View  Result Date: 06/10/2016 CLINICAL DATA:   Shortness of breath.  History of osteomyelitis EXAM: PORTABLE CHEST 1 VIEW COMPARISON:  January 20, 2014 FINDINGS: There is no edema or consolidation. Heart is upper normal in size with pulmonary vascularity within normal limits. There is atherosclerotic calcification in the aortic arch. No bone lesions. IMPRESSION: Aortic atherosclerosis.  No edema or consolidation. Electronically Signed   By: Lowella Grip III M.D.   On: 06/10/2016 14:02    ROS Blood pressure 137/68, pulse 68, temperature 98 F (36.7 C), temperature source Oral, resp. rate 18, height _0  (1.88 m), weight 199 lb 15.3 oz (90.7 kg), SpO2 96 %. Physical Exam   General: AAO x3, NAD  Dermatological: Left second toe significant swelling as well as black discoloration to the toe consistent with gangrene. There is cellulitis of the toe. There is a mild increase in warmth the dorsal aspect of the foot and some mild erythema how there is no ascending synovitis present. There is no areas of fluctuance or crepitus. His wound at the distal aspect of the toe which does probe to down to the bone. There is no malodor. There is no other lesions identified this time. Status post partial first ray amputation which is well-healed on the left foot.  Vascular: Dorsalis Pedis artery and Posterior Tibial artery pedal pulses are palpable with immedate capillary fill time except for the left 2nd toe. There is no pain with calf compression, swelling, warmth, erythema.   Neruologic: Decreased sensation present.  Musculoskeletal: Hammertoes present.   Gait: Unassisted, Nonantalgic.    Assessment/Plan: Left second digit osteomyelitis, cellulitis  -Treatment options discussed including all alternatives, risks, and complications -Chart was reviewed. -At this time I discussed with him indication of the Nicole Kindred wishes to proceed. We will likely do this on Wednesday for left second toe versus partial ray amputation. I discussed with him, his  wife, his  daughter who was present today and they want to go ahead and proceed with the toe amputation. I discussed with him risks and complications of the surgery understand this and wish to proceed. -Vanc/Zosyn -I did take cultures in the office on Friday. We'll follow this as well. -Surgical shoe left foot -I will continue to follow the patient while admitted. Thank you for the consult.  Celesta Gentile, DPM Cell: [phone] Office: [phone]  Jacqualyn Posey, Alanta Scobey 06/10/2016, 8:13 PM

## 2016-06-10 NOTE — ED Notes (Signed)
Phlebotomy at bedside with patient and family.

## 2016-06-10 NOTE — ED Triage Notes (Signed)
Had left great toenail removed on Friday- and and bone culture-- now skin is peeling -- reddened area-- small amount of drainage, pt has neuropathy in foot and cannot feel toes.  Dr. Andrey Cota at foot center.

## 2016-06-10 NOTE — Progress Notes (Signed)
06/10/2016 1:49 PM  Report received. Room is ready for patient.  [employer], RN-BC, [facility] [facility] [address] Phone [phone]

## 2016-06-10 NOTE — Progress Notes (Signed)
06/10/2016 3:25 PM  New Admission Note:   Arrival Method: Via bed from,ED with EMT Mental Orientation: Alert and oriented X4 Telemetry: No orders at this time Assessment: Completed Skin: Warm, dry and intact. Left second toe, black, red and draining. No other skin issues at this time. IV: Clean, dry and intact. Normal saline at 50cc Pain: No stated pain at this time  Tubes: N/A Safety Measures: Safety Fall Prevention Plan has been given, discussed and signed.  Admission: Completed [facility] Orientation: Patient has been orientated to the room, unit and staff.  Family: Both daughters at bedside. Wife on the way from the house.   Orders have been reviewed and implemented. Will continue to monitor the patient. Call light has been placed within reach and bed alarm has been activated.   [employer], RN-BC, RN3 [facility]  Phone number: [phone]

## 2016-06-10 NOTE — ED Notes (Signed)
Patient was seen at [facility] [place] [facility]) on 12-Feb-2023 where he removed dead skin and the toe nail.  Patient here today due to swelling, redness and grey mottling skin.  Patient denies pain in the foot.

## 2016-06-11 DIAGNOSIS — N183 Chronic kidney disease, stage 3 unspecified: Secondary | ICD-10-CM

## 2016-06-11 DIAGNOSIS — N179 Acute kidney failure, unspecified: Secondary | ICD-10-CM

## 2016-06-11 DIAGNOSIS — M869 Osteomyelitis, unspecified: Secondary | ICD-10-CM

## 2016-06-11 LAB — BASIC METABOLIC PANEL
Anion gap: 5 (ref 5–15)
BUN: 34 mg/dL — ABNORMAL HIGH (ref 6–20)
CO2: 31 mmol/L (ref 22–32)
Calcium: 8.6 mg/dL — ABNORMAL LOW (ref 8.9–10.3)
Chloride: 99 mmol/L — ABNORMAL LOW (ref 101–111)
Creatinine, Ser: 2.1 mg/dL — ABNORMAL HIGH (ref 0.61–1.24)
GFR calc Af Amer: 31 mL/min — ABNORMAL LOW (ref >60)
GFR calc non Af Amer: 27 mL/min — ABNORMAL LOW (ref >60)
Glucose, Bld: 269 mg/dL — ABNORMAL HIGH (ref 65–99)
Potassium: 5 mmol/L (ref 3.5–5.1)
Sodium: 135 mmol/L (ref 135–145)

## 2016-06-11 LAB — CBC
HCT: 38.4 % — ABNORMAL LOW (ref 39.0–52.0)
Hemoglobin: 12.2 g/dL — ABNORMAL LOW (ref 13.0–17.0)
MCH: 31.3 pg (ref 26.0–34.0)
MCHC: 31.8 g/dL (ref 30.0–36.0)
MCV: 98.5 fL (ref 78.0–100.0)
Platelets: 321 10*3/uL (ref 150–400)
RBC: 3.9 MIL/uL — ABNORMAL LOW (ref 4.22–5.81)
RDW: 13.1 % (ref 11.5–15.5)
WBC: 10.2 10*3/uL (ref 4.0–10.5)

## 2016-06-11 LAB — SURGICAL PCR SCREEN
MRSA, PCR: NEGATIVE
Staphylococcus aureus: NEGATIVE

## 2016-06-11 LAB — GLUCOSE, CAPILLARY
Glucose-Capillary: 278 mg/dL — ABNORMAL HIGH (ref 65–99)
Glucose-Capillary: 112 mg/dL — ABNORMAL HIGH (ref 65–99)
Glucose-Capillary: 173 mg/dL — ABNORMAL HIGH (ref 65–99)
Glucose-Capillary: 246 mg/dL — ABNORMAL HIGH (ref 65–99)

## 2016-06-11 LAB — HEMOGLOBIN A1C
Hgb A1c MFr Bld: 9.3 % — ABNORMAL HIGH (ref 4.8–5.6)
Mean Plasma Glucose: 220 mg/dL

## 2016-06-11 LAB — PATHOLOGY

## 2016-06-11 MED ORDER — INSULIN ASPART 100 UNIT/ML ~~LOC~~ SOLN
3.0000 [IU] | Freq: Three times a day (TID) | SUBCUTANEOUS | Status: DC
  Administered 2016-06-11 – 2016-06-13 (×3): 3 [IU] via SUBCUTANEOUS

## 2016-06-11 MED ORDER — INSULIN GLARGINE 100 UNIT/ML ~~LOC~~ SOLN
18.0000 [IU] | Freq: Every day | SUBCUTANEOUS | Status: DC
  Administered 2016-06-11 – 2016-06-13 (×3): 18 [IU] via SUBCUTANEOUS
  Filled 2016-06-11 (×4): qty 0.18

## 2016-06-11 MED ORDER — SODIUM CHLORIDE 0.9 % IV SOLN
INTRAVENOUS | Status: AC
  Administered 2016-06-11: 17:00:00 via INTRAVENOUS

## 2016-06-11 MED ORDER — CHLORHEXIDINE GLUCONATE CLOTH 2 % EX PADS: 6.0000 | MEDICATED_PAD | Freq: Once | CUTANEOUS | Status: DC

## 2016-06-11 MED ORDER — ZOLPIDEM TARTRATE 5 MG PO TABS: 5.0000 mg | ORAL_TABLET | Freq: Every evening | ORAL | Status: DC | PRN

## 2016-06-11 MED ORDER — SODIUM CHLORIDE 0.45 % IV SOLN
INTRAVENOUS | Status: DC
  Administered 2016-06-11 – 2016-06-12 (×3): via INTRAVENOUS

## 2016-06-11 MED ORDER — CHLORHEXIDINE GLUCONATE CLOTH 2 % EX PADS
6.0000 | MEDICATED_PAD | Freq: Once | CUTANEOUS | Status: AC
  Administered 2016-06-12: 6 via TOPICAL

## 2016-06-11 NOTE — Progress Notes (Signed)
06/11/2016 6:17 PM   Confirmed with patient, family and Dr. Sharol Given the plans for TMA with Dr. Sharol Given in [facility] tomorrow.  This RN called Dr. Earleen Newport with podiatry and informed him of the change in plans. Consent obtained for left TMA.    Princella Pellegrini

## 2016-06-11 NOTE — Care Management Important Message (Signed)
Important Message  Patient Details  Name: [name] Small MRN: [id number] Date of Birth: [birth year]   Medicare Important Message Given:  Yes    Loann Quill 06/11/2016, 8:16 AM

## 2016-06-11 NOTE — Consult Note (Signed)
ORTHOPAEDIC CONSULTATION  REQUESTING PHYSICIAN: Louellen Molder, MD  Chief Complaint: Status post first ray amputation left foot with gangrene of the second toe.  HPI: [name] Small is a 82 y.o. male who presents with gangrene of the second toe. He is about 2 months out from a first ray amputation. Previous to the first ray amputation patient did have a balloon angioplasty by cardiology to improve the circulation to his foot. His most recent ankle-brachial indices that I was able to review shows an ABI of 0.83 with monophasic flow.  Past Medical History:  Diagnosis Date  . Arthritis   . Back pain    s/p lumbar injection 2014  . BENIGN PROSTATIC HYPERTROPHY 05/21/2007  . Bladder cancer ([place]) 10/2011  . CAD (coronary artery disease) CARDIOLOGIST- DR WALL - VISIT IN JUNE 2012 FOR CATH   nonobstructive by cath 6/12:  mid LAD 30%, proximal obtuse marginal-2 30%, proximal RCA 20%, mid RCA 30-40%.  He had normal cardiac output and mildly elevated filling pressures but no significant pulmonary hypertension;   Echocardiogram in May 2012 demonstrated EF 50-55% and left atrial enlargement   . Cellulitis of left foot    Hospitalized in 2006  . CHF (congestive heart failure) ([place])    per family, issues in 2012  . Chronic kidney disease (CKD), stage III (moderate) 12/04/2007   FOLLOWED BY DR PATEL  . Chronic pain of lower extremity   . DIABETES MELLITUS, TYPE II 05/21/2007  . DIVERTICULOSIS, COLON 05/21/2007   pt denies  . Fatigue AGE-RELATED  . Gross hematuria    pt denies  . HYPERTENSION 05/21/2007  . Impaired hearing BILATERAL HEARING AIDS  . On home oxygen therapy    "2L at nighttime" (02/28/2016)  . PAD (peripheral artery disease) ([place])   . Psoriasis ELBOWS  . PSORIASIS, SCALP 10/25/2008   Past Surgical History:  Procedure Laterality Date  . AMPUTATION Left 03/27/2016   Procedure: partial first AMPUTATION RAY; LEFT;  Surgeon: Trula Slade, DPM;  Location: [place];  Service:  Podiatry;  Laterality: Left;  . APPENDECTOMY  1941  . CARDIAC CATHETERIZATION  04-24-11/  DR Rogue Jury ARIDA   MILD NONOBSTRUCTIVE CAD, NORMA CARDIAC OUTPUT  . CARDIOVASCULAR STRESS TEST  2007  . CATARACT EXTRACTION W/ INTRAOCULAR LENS  IMPLANT, BILATERAL Bilateral ~ 2010  . CYSTOSCOPY  12/25/2011   Procedure: CYSTOSCOPY;  Surgeon: Molli Hazard, MD;  Location: [facility];  Service: Urology;  Laterality: N/A;  needs intubation and to be paralyzed   . CYSTOSCOPY W/ RETROGRADES  10/30/2011   Procedure: CYSTOSCOPY WITH RETROGRADE PYELOGRAM;  Surgeon: Molli Hazard, MD;  Location: [facility];  Service: Urology;  Laterality: Bilateral;  CYSTOSCOPY POSS TURBT BILATERAL RETROGRADE PYLEOGRAM   . CYSTOSCOPY W/ RETROGRADES  11/30/2012   Procedure: CYSTOSCOPY WITH RETROGRADE PYELOGRAM;  Surgeon: Molli Hazard, MD;  Location: [facility];  Service: Urology;  Laterality: Bilateral;  Flexible cystoscopy.  . CYSTOSCOPY WITH BIOPSY  11/30/2012   Procedure: CYSTOSCOPY WITH BIOPSY;  Surgeon: Molli Hazard, MD;  Location: [facility];  Service: Urology;  Laterality: N/A;  . INCISION AND DRAINAGE FOOT Left ~ 2005   LEFT FOOT DUE TO INFECTION FROM  NAIL PUNCTURE INJURY  . PENILE PROSTHESIS IMPLANT  1990s  . PERIPHERAL VASCULAR CATHETERIZATION N/A 02/14/2016   Procedure: Abdominal Aortogram w/Lower Extremity;  Surgeon: Wellington Hampshire, MD;  Location: [place] [facility];  Service: Cardiovascular;  Laterality: N/A;  .  PERIPHERAL VASCULAR CATHETERIZATION Left 02/28/2016   Procedure: Peripheral Vascular Balloon Angioplasty;  Surgeon: Wellington Hampshire, MD;  Location: [place] [facility];  Service: Cardiovascular;  Laterality: Left;  left peroneal and popliteal artery  . [place]  . TRANSURETHRAL RESECTION OF BLADDER TUMOR  10/30/2011   Procedure: TRANSURETHRAL RESECTION OF BLADDER TUMOR (TURBT);   Surgeon: Molli Hazard, MD;  Location: [facility];  Service: Urology;  Laterality: N/A;  . TRANSURETHRAL RESECTION OF BLADDER TUMOR  12/25/2011   Procedure: TRANSURETHRAL RESECTION OF BLADDER TUMOR (TURBT);  Surgeon: Molli Hazard, MD;  Location: [facility];  Service: Urology;  Laterality: N/A;  need long gyrus instruments   . VASECTOMY  1990s   Social History   Social History  . Marital status: Married    Spouse name: N/A  . Number of children: 4  . Years of education: N/A   Occupational History  . retired      [employer]	[place].   Social History Main Topics  . Smoking status: Former Smoker    Packs/day: 2.00    Years: 22.00    Types: Cigarettes    Quit date: 11/11/1968  . Smokeless tobacco: Never Used  . Alcohol use 1.2 oz/week    2 Glasses of wine per week     Comment: occassional  . Drug use: No  . Sexual activity: Not Currently   Other Topics Concern  . None   Social History Narrative   Retired Event organiser.   Active with golf.   [place] vet.  [place] [phone][place]     Family History  Problem Relation Age of Onset  . Diabetes Mother   . Heart disease Brother   . Heart disease Brother   . Heart disease Brother   . Heart disease Brother   . Heart disease Brother   . Heart disease Brother   . Lung cancer Brother    - negative except otherwise stated in the family history section Allergies  Allergen Reactions  . Actos [Pioglitazone Hydrochloride] Other (See Comments)    Held 2012 bladder cancer  . Aspirin Other (See Comments)    Held 2012 due to hematuria and bruising.   . Ace Inhibitors Cough  . Gabapentin     Upset stomach and diarrhea - tolerates low doses  . Lyrica [Pregabalin] Other (See Comments)    swelling  . Pravastatin Other (See Comments)    Swelling of feet  . Bactrim Rash and Other (See Comments)    Rash, presumed allergy  . Sulfa Drugs Cross Reactors Rash   Prior to Admission medications     Medication Sig Start Date End Date Taking? Authorizing Provider  acetaminophen (TYLENOL) 500 MG tablet Take 1,000 mg by mouth every 8 (eight) hours as needed for mild pain, moderate pain or headache.   Yes Historical Provider, MD  amLODipine (NORVASC) 5 MG tablet Take 1 tablet (5 mg total) by mouth daily. 11/10/15  Yes Tonia Ghent, MD  atorvastatin (LIPITOR) 10 MG tablet Take 1 tablet (10 mg total) by mouth daily. 02/23/16  Yes Wellington Hampshire, MD  budesonide-formoterol (SYMBICORT) 160-4.5 MCG/ACT inhaler Inhale 2 puffs into the lungs 2 (two) times daily as needed (shortness of breath).  01/21/14  Yes Tonia Ghent, MD  ciprofloxacin (CIPRO) 500 MG tablet Take 1 tablet by mouth 2 (two) times daily. 03/21/16  Yes Historical Provider, MD  clindamycin (CLEOCIN) 300 MG capsule Take 1 capsule (300 mg total)  by mouth 3 (three) times daily. 06/07/16  Yes Trula Slade, DPM  clobetasol (TEMOVATE) 0.05 % external solution Apply 1 application topically daily.   Yes Historical Provider, MD  clopidogrel (PLAVIX) 75 MG tablet Take 1 tablet by mouth  daily 02/08/16  Yes Tonia Ghent, MD  collagenase (SANTYL) ointment Apply 1 application topically daily.   Yes Historical Provider, MD  furosemide (LASIX) 40 MG tablet TAKE 1 TABLET BY MOUTH  EVERY MONDAY, WEDNESDAY,  AND FRIDAY 12/01/15  Yes Tonia Ghent, MD  gabapentin (NEURONTIN) 100 MG capsule Take 100 mg by mouth 3 (three) times daily.   Yes Historical Provider, MD  glipiZIDE (GLUCOTROL XL) 5 MG 24 hr tablet Take 1 tablet by mouth  daily with breakfast 04/26/16  Yes Tonia Ghent, MD  insulin glargine (LANTUS) 100 UNIT/ML injection Inject 0.1 mLs (10 Units total) into the skin daily. 04/23/16  Yes Tonia Ghent, MD  ipratropium-albuterol (DUONEB) 0.5-2.5 (3) MG/3ML SOLN Take 3 mLs by nebulization every 6 (six) hours as needed. 03/29/16  Yes Oswald Hillock, MD  polyethylene glycol (MIRALAX / GLYCOLAX) packet Take 17 g by mouth daily as needed. 03/29/16   Yes Oswald Hillock, MD  tiotropium (SPIRIVA) 18 MCG inhalation capsule Place 18 mcg into inhaler and inhale daily.   Yes Historical Provider, MD   Dg Chest Port 1 View  Result Date: 06/10/2016 CLINICAL DATA:  Shortness of breath.  History of osteomyelitis EXAM: PORTABLE CHEST 1 VIEW COMPARISON:  January 20, 2014 FINDINGS: There is no edema or consolidation. Heart is upper normal in size with pulmonary vascularity within normal limits. There is atherosclerotic calcification in the aortic arch. No bone lesions. IMPRESSION: Aortic atherosclerosis.  No edema or consolidation. Electronically Signed   By: Lowella Grip III M.D.   On: 06/10/2016 14:02   - pertinent xrays, CT, MRI studies were reviewed and independently interpreted  Positive ROS: All other systems have been reviewed and were otherwise negative with the exception of those mentioned in the HPI and as above.  Physical Exam: General: Alert, no acute distress Cardiovascular: No pedal edema Respiratory: No cyanosis, no use of accessory musculature GI: No organomegaly, abdomen is soft and non-tender Skin: Patient has gangrene of the second toe with exposed tuft. There is no ascending cellulitis. No purulent drainage. Neurologic: Patient does not have protective sensation. Psychiatric: Patient is competent for consent with normal mood and affect Lymphatic: No axillary or cervical lymphadenopathy  MUSCULOSKELETAL:  Objective examination patient does have a palpable dorsalis pedis pulse he has some brawny skin color changes in his foot but no significant venous stasis swelling. The surgical incision is well-healed the toe is black and gangrenous with exposed bone. There is no purulent drainage no abscess. There is no gangrenous changes of toes 3 and 4 and 5. I reviewed his radiographs postoperatively from the first ray amputation and this showed no osteomyelitis of his digits.  Assessment: Assessment: Diabetic insensate neuropathy with  peripheral vascular disease status post first ray amputation of the left foot great toe status post balloon angioplasty with black gangrenous changes to the second toe. Patient currently does not smoke.   Plan: Plan: I discussed with the patient additional options they state there is currently considering a second toe amputation with Dr. Earleen Newport. I discussed with his severe microcirculatory disease that in my opinion he may be better off with a transmetatarsal amputation. Discussed that he still is at increased risk of the wound not healing but  he would be less likely to have further gangrenous changes to the other toes. Would use a wound VAC and nitroglycerin patches postoperatively. After a long discussion the patient and his family stated that they would rather proceed with more aggressive surgery then risk having to come back again for another toe amputation. We will make the patient nothing by mouth and plan for transmetatarsal amputation tomorrow. Risks and benefits were discussed including the risk of the wound not healing. Discussed the importance of nonweightbearing postoperatively.   Thank you for the consult and the opportunity to see Mr. [name] Small, [place] [phone] 5:31 PM

## 2016-06-11 NOTE — Progress Notes (Signed)
PROGRESS NOTE                                                                                                                                                                                                             Patient Demographics:    [name] Small, is a 82 y.o. male, DOB - [birth year], [id number]  Admit date - 06/10/2016   Admitting Physician Waldemar Dickens, MD  Outpatient Primary MD for the patient is [name], MD  LOS - 1  Outpatient Specialists: Dr Jacqualyn Posey ( podiatry)  Chief Complaint  Patient presents with  . Toe Pain       Brief Narrative  82 year old male with history of coronary artery disease, CHF, diabetes mellitus, PVD, hypertension, BPH, history of bladder cancer, diabetic foot ulcer with osteomyelitis of left great toe status post partial first ray amputation in May this year, who presented with osteomyelitis of the left second toe with failed outpatient therapy. He was following with Dr. Jacqualyn Posey (podiatrist). He initially had a blister with a wound when seen in the office 3 days back on the second toe and was started on clindamycin and ciprofloxacin. Symptoms worsened and he was instructed to come to the ED. Patient planned for amputation of the left second toe on 8/2.   Subjective:   Patient denies any pain. Wife at bedside and both request for orthopedic consult for a second opinion.   Assessment  & Plan :    Principal Problem:   Diabetic Osteomyelitis Of left second toe (HCC) Empiric vancomycin and Zosyn. Dr. Jacqualyn Posey planning on left second toe versus partial amputation on 8/2. Patient and his wife requesting for orthopedics consult for a second opinion. I have consulted Dr. Sharol Given who will see patient. Blood cultures so far negative. Remains afebrile. One cultures sent by Dr. Jacqualyn Posey on 7/28 (I do not see any results in the system). Discussed plan with Dr. Jacqualyn Posey who is fine  with  orthopedics evaluation.  Active Problems:   Uncontrolled Diabetes mellitus with neurologic complications (HCC) CBG elevated secondary to infection. A1c of 9.3. Will increase Lantus dose and add pre-meal aspart. Continue Neurontin  Acute on  Chronic kidney disease, stage III (moderate) Place on gentle IV hydration. Hold Lasix and avoid nephrotoxins.    BPH (benign prostatic hyperplasia)    CAD (coronary artery disease) of artery  bypass graft Continue statin.  Afib Patient was seen by Dr. Meda Coffee back in November 2016 and then was admitted by cardiology in April this year for critical lower limb ischemia and underwent revascularization.. Both the times there was no mention of A. fib. EKG shows sinus rhythm with PACs. Patient is on plavix being held for surgery. CHADS2 vasc of 6 ( HTN, age, DM, PVD, DCHF). I'm not sure if he truly has a diagnosis of A. fib and if so,  he needs to be on anticoagulation.   History of bladder cancer  status post chemotherapy  COPD Stable. Continue home inhalers     Code Status : Full code  Family Communication  : Wife at bedside  Disposition Plan  : Pending hospital course  Barriers For Discharge : Pending surgery (possibly on 8/2)  Consults  :   Podiatry Orthopedics (Dr. Sharol Given)  Procedures  : None  DVT Prophylaxis  :  Subcutaneous heparin  Lab Results  Component Value Date   PLT 321 06/11/2016    Antibiotics  :    Anti-infectives    Start     Dose/Rate Route Frequency Ordered Stop   06/11/16 1200  vancomycin (VANCOCIN) IVPB 1000 mg/200 mL premix  Status:  Discontinued     1,000 mg 200 mL/hr over 60 Minutes Intravenous Every 24 hours 06/10/16 1224 06/10/16 1309   06/11/16 1200  vancomycin (VANCOCIN) 1,250 mg in sodium chloride 0.9 % 250 mL IVPB     1,250 mg 166.7 mL/hr over 90 Minutes Intravenous Every 24 hours 06/10/16 1309     06/10/16 1400  piperacillin-tazobactam (ZOSYN) IVPB 3.375 g     3.375 g 12.5 mL/hr over 240 Minutes  Intravenous Every 8 hours 06/10/16 1307     06/10/16 1130  vancomycin (VANCOCIN) IVPB 1000 mg/200 mL premix     1,000 mg 200 mL/hr over 60 Minutes Intravenous  Once 06/10/16 1129 06/10/16 1451   06/10/16 1130  piperacillin-tazobactam (ZOSYN) IVPB 3.375 g     3.375 g 100 mL/hr over 30 Minutes Intravenous  Once 06/10/16 1129 06/10/16 1330        Objective:   Vitals:   06/10/16 1731 06/10/16 2100 06/11/16 0435 06/11/16 0830  BP: 137/68 124/60 (!) 119/56 138/64  Pulse: 68 72 71 65  Resp:  17 18 18   Temp: 98 F (36.7 C) 98.2 F (36.8 C) 98 F (36.7 C) 97.7 F (36.5 C)  TempSrc: Oral Oral Oral Oral  SpO2: 96% 97% 91% 97%  Weight:  91.4 kg (201 lb 8 oz)    Height:        Wt Readings from Last 3 Encounters:  06/10/16 91.4 kg (201 lb 8 oz)  05/08/16 95 kg (209 lb 8 oz)  05/03/16 95.4 kg (210 lb 4 oz)     Intake/Output Summary (Last 24 hours) at 06/11/16 1425 Last data filed at 06/11/16 0849  Gross per 24 hour  Intake           809.17 ml  Output                0 ml  Net           809.17 ml     Physical Exam  Gen: not in distress HEENT:  moist mucosa, supple neck Chest: clear b/l, no added sounds CVS: N S1&S2, no murmurs, rubs or gallop GI: soft, NT, ND, BS+ Musculoskeletal: warm, Gangrenous left second toe, status post left great toe amputation, adequate distal pulses CNS:  Alert and oriented    Data Review:    CBC  Recent Labs Lab 06/10/16 1149 06/11/16 0534  WBC 9.0 10.2  HGB 13.9 12.2*  HCT 42.4 38.4*  PLT 319 321  MCV 97.7 98.5  MCH 32.0 31.3  MCHC 32.8 31.8  RDW 12.8 13.1  LYMPHSABS 1.3  --   MONOABS 0.8  --   EOSABS 0.3  --   BASOSABS 0.1  --     Chemistries   Recent Labs Lab 06/10/16 1133 06/11/16 0534  NA 137 135  K 4.8 5.0  CL 99* 99*  CO2 30 31  GLUCOSE 270* 269*  BUN 28* 34*  CREATININE 1.84* 2.10*  CALCIUM 9.2 8.6*    ------------------------------------------------------------------------------------------------------------------ No results for input(s): CHOL, HDL, LDLCALC, TRIG, CHOLHDL, LDLDIRECT in the last 72 hours.  Lab Results  Component Value Date   HGBA1C 9.3 (H) 06/10/2016   ------------------------------------------------------------------------------------------------------------------ No results for input(s): TSH, T4TOTAL, T3FREE, THYROIDAB in the last 72 hours.  Invalid input(s): FREET3 ------------------------------------------------------------------------------------------------------------------ No results for input(s): VITAMINB12, FOLATE, FERRITIN, TIBC, IRON, RETICCTPCT in the last 72 hours.  Coagulation profile  Recent Labs Lab 06/10/16 1330  INR 1.09    No results for input(s): DDIMER in the last 72 hours.  Cardiac Enzymes No results for input(s): CKMB, TROPONINI, MYOGLOBIN in the last 168 hours.  Invalid input(s): CK ------------------------------------------------------------------------------------------------------------------    Component Value Date/Time   BNP 125.3 (H) 03/27/2016 2102    Inpatient Medications  Scheduled Meds: . amLODipine  5 mg Oral Daily  . atorvastatin  10 mg Oral Daily  . Chlorhexidine Gluconate Cloth  6 each Topical Once  . furosemide  40 mg Oral QODAY  . gabapentin  100 mg Oral TID  . heparin  5,000 Units Subcutaneous Q8H  . insulin aspart  0-15 Units Subcutaneous TID WC  . insulin aspart  0-5 Units Subcutaneous QHS  . insulin glargine  12 Units Subcutaneous QHS  . mometasone-formoterol  2 puff Inhalation BID  . piperacillin-tazobactam (ZOSYN)  IV  3.375 g Intravenous Q8H  . tiotropium  18 mcg Inhalation Daily  . vancomycin  1,250 mg Intravenous Q24H   Continuous Infusions:  PRN Meds:.acetaminophen **OR** acetaminophen, ALPRAZolam, bisacodyl, ipratropium-albuterol, ondansetron **OR** ondansetron (ZOFRAN) IV, polyethylene  glycol  Micro Results Recent Results (from the past 240 hour(s))  Blood culture (routine x 2)     Status: None (Preliminary result)   Collection Time: 06/10/16 11:33 AM  Result Value Ref Range Status   Specimen Description BLOOD RIGHT ANTECUBITAL  Final   Special Requests BOTTLES DRAWN AEROBIC AND ANAEROBIC  5CC  Final   Culture NO GROWTH 1 DAY  Final   Report Status PENDING  Incomplete  Blood culture (routine x 2)     Status: None (Preliminary result)   Collection Time: 06/10/16 12:00 PM  Result Value Ref Range Status   Specimen Description BLOOD RIGHT HAND  Final   Special Requests BOTTLES DRAWN AEROBIC AND ANAEROBIC  5CC  Final   Culture NO GROWTH 1 DAY  Final   Report Status PENDING  Incomplete    Radiology Reports Dg Chest Port 1 View  Result Date: 06/10/2016 CLINICAL DATA:  Shortness of breath.  History of osteomyelitis EXAM: PORTABLE CHEST 1 VIEW COMPARISON:  January 20, 2014 FINDINGS: There is no edema or consolidation. Heart is upper normal in size with pulmonary vascularity within normal limits. There is atherosclerotic calcification in the aortic arch. No bone lesions. IMPRESSION: Aortic atherosclerosis.  No edema or consolidation. Electronically  Signed   By: Lowella Grip III M.D.   On: 06/10/2016 14:02    Time Spent in minutes  25   Louellen Molder M.D on 06/11/2016 at 2:25 PM  Between 7am to 7pm - Pager - [phone]  After 7pm go to [email or web address] - password [facility]  [facility] -  Office  [phone]

## 2016-06-12 ENCOUNTER — Inpatient Hospital Stay (HOSPITAL_COMMUNITY): Payer: Medicare Other | Admitting: Certified Registered Nurse Anesthetist

## 2016-06-12 ENCOUNTER — Encounter (HOSPITAL_COMMUNITY)
Admission: EM | Disposition: A | Discharge status: Home Health | Payer: Self-pay | Source: Home / Self Care | Attending: Internal Medicine

## 2016-06-12 ENCOUNTER — Telehealth: Payer: Self-pay

## 2016-06-12 DIAGNOSIS — Z8709 Personal history of other diseases of the respiratory system: Secondary | ICD-10-CM

## 2016-06-12 DIAGNOSIS — I25810 Atherosclerosis of coronary artery bypass graft(s) without angina pectoris: Secondary | ICD-10-CM

## 2016-06-12 DIAGNOSIS — E1140 Type 2 diabetes mellitus with diabetic neuropathy, unspecified: Secondary | ICD-10-CM

## 2016-06-12 DIAGNOSIS — M8610 Other acute osteomyelitis, unspecified site: Secondary | ICD-10-CM

## 2016-06-12 DIAGNOSIS — N183 Chronic kidney disease, stage 3 (moderate): Secondary | ICD-10-CM

## 2016-06-12 DIAGNOSIS — N179 Acute kidney failure, unspecified: Secondary | ICD-10-CM

## 2016-06-12 HISTORY — PX: AMPUTATION: SHX166

## 2016-06-12 LAB — BASIC METABOLIC PANEL
Anion gap: 8 (ref 5–15)
BUN: 26 mg/dL — ABNORMAL HIGH (ref 6–20)
CO2: 30 mmol/L (ref 22–32)
Calcium: 8.4 mg/dL — ABNORMAL LOW (ref 8.9–10.3)
Chloride: 100 mmol/L — ABNORMAL LOW (ref 101–111)
Creatinine, Ser: 2.05 mg/dL — ABNORMAL HIGH (ref 0.61–1.24)
GFR calc Af Amer: 32 mL/min — ABNORMAL LOW (ref >60)
GFR calc non Af Amer: 28 mL/min — ABNORMAL LOW (ref >60)
Glucose, Bld: 99 mg/dL (ref 65–99)
Potassium: 4.4 mmol/L (ref 3.5–5.1)
Sodium: 138 mmol/L (ref 135–145)

## 2016-06-12 LAB — GLUCOSE, CAPILLARY
Glucose-Capillary: 94 mg/dL (ref 65–99)
Glucose-Capillary: 81 mg/dL (ref 65–99)
Glucose-Capillary: 71 mg/dL (ref 65–99)
Glucose-Capillary: 135 mg/dL — ABNORMAL HIGH (ref 65–99)
Glucose-Capillary: 118 mg/dL — ABNORMAL HIGH (ref 65–99)

## 2016-06-12 SURGERY — AMPUTATION, FOOT, PARTIAL
Anesthesia: General | Laterality: Left

## 2016-06-12 SURGERY — AMPUTATION DIGIT
Anesthesia: General | Laterality: Left

## 2016-06-12 MED ORDER — METHOCARBAMOL 1000 MG/10ML IJ SOLN
500.0000 mg | Freq: Four times a day (QID) | INTRAVENOUS | Status: DC | PRN
  Filled 2016-06-12: qty 5

## 2016-06-12 MED ORDER — LIDOCAINE HCL (CARDIAC) 20 MG/ML IV SOLN
INTRAVENOUS | Status: DC | PRN
  Administered 2016-06-12: 60 mg via INTRATRACHEAL

## 2016-06-12 MED ORDER — OXYCODONE HCL 5 MG PO TABS
5.0000 mg | ORAL_TABLET | ORAL | Status: DC | PRN
  Administered 2016-06-12: 10 mg via ORAL
  Administered 2016-06-13 – 2016-06-14 (×2): 5 mg via ORAL
  Filled 2016-06-12: qty 1
  Filled 2016-06-12 (×2): qty 2

## 2016-06-12 MED ORDER — PROPOFOL 10 MG/ML IV BOLUS
INTRAVENOUS | Status: DC | PRN
  Administered 2016-06-12: 120 mg via INTRAVENOUS

## 2016-06-12 MED ORDER — ONDANSETRON HCL 4 MG/2ML IJ SOLN
INTRAMUSCULAR | Status: DC | PRN
  Administered 2016-06-12: 4 mg via INTRAVENOUS

## 2016-06-12 MED ORDER — ACETAMINOPHEN 325 MG PO TABS: 650.0000 mg | ORAL_TABLET | Freq: Four times a day (QID) | ORAL | Status: DC | PRN

## 2016-06-12 MED ORDER — ACETAMINOPHEN 650 MG RE SUPP: 650.0000 mg | Freq: Four times a day (QID) | RECTAL | Status: DC | PRN

## 2016-06-12 MED ORDER — FENTANYL CITRATE (PF) 250 MCG/5ML IJ SOLN
INTRAMUSCULAR | Status: DC | PRN
  Administered 2016-06-12: 50 ug via INTRAVENOUS

## 2016-06-12 MED ORDER — FENTANYL CITRATE (PF) 250 MCG/5ML IJ SOLN
INTRAMUSCULAR | Status: AC
  Filled 2016-06-12: qty 5

## 2016-06-12 MED ORDER — METOCLOPRAMIDE HCL 5 MG/ML IJ SOLN: 5.0000 mg | Freq: Three times a day (TID) | INTRAMUSCULAR | Status: DC | PRN

## 2016-06-12 MED ORDER — PHENYLEPHRINE HCL 10 MG/ML IJ SOLN
INTRAMUSCULAR | Status: DC | PRN
  Administered 2016-06-12: 120 ug via INTRAVENOUS

## 2016-06-12 MED ORDER — ONDANSETRON HCL 4 MG PO TABS: 4.0000 mg | ORAL_TABLET | Freq: Four times a day (QID) | ORAL | Status: DC | PRN

## 2016-06-12 MED ORDER — 0.9 % SODIUM CHLORIDE (POUR BTL) OPTIME
TOPICAL | Status: DC | PRN
  Administered 2016-06-12: 1000 mL

## 2016-06-12 MED ORDER — NITROGLYCERIN 0.2 MG/HR TD PT24
0.2000 mg | MEDICATED_PATCH | Freq: Every day | TRANSDERMAL | Status: DC
  Administered 2016-06-13 – 2016-06-14 (×2): 0.2 mg via TRANSDERMAL
  Filled 2016-06-12 (×3): qty 1

## 2016-06-12 MED ORDER — METHOCARBAMOL 500 MG PO TABS: 500.0000 mg | ORAL_TABLET | Freq: Four times a day (QID) | ORAL | Status: DC | PRN

## 2016-06-12 MED ORDER — NITROGLYCERIN 0.2 MG/HR TD PT24: 0.2000 mg | MEDICATED_PATCH | Freq: Every day | TRANSDERMAL | 12 refills | Status: DC

## 2016-06-12 MED ORDER — SODIUM CHLORIDE 0.9 % IV SOLN: INTRAVENOUS | Status: DC

## 2016-06-12 MED ORDER — PROPOFOL 10 MG/ML IV BOLUS
INTRAVENOUS | Status: AC
  Filled 2016-06-12: qty 20

## 2016-06-12 MED ORDER — NITROGLYCERIN 0.2 MG/HR TD PT24: 0.2000 mg | MEDICATED_PATCH | Freq: Every day | TRANSDERMAL | Status: DC

## 2016-06-12 MED ORDER — SODIUM CHLORIDE 0.9 % IV SOLN
INTRAVENOUS | Status: DC
  Administered 2016-06-12: 20:00:00 via INTRAVENOUS

## 2016-06-12 MED ORDER — DEXTROSE-NACL 5-0.45 % IV SOLN
INTRAVENOUS | Status: AC
  Administered 2016-06-12: 14:00:00 via INTRAVENOUS

## 2016-06-12 MED ORDER — ONDANSETRON HCL 4 MG/2ML IJ SOLN: 4.0000 mg | Freq: Four times a day (QID) | INTRAMUSCULAR | Status: DC | PRN

## 2016-06-12 MED ORDER — METOCLOPRAMIDE HCL 5 MG PO TABS: 5.0000 mg | ORAL_TABLET | Freq: Three times a day (TID) | ORAL | Status: DC | PRN

## 2016-06-12 SURGICAL SUPPLY — 31 items
BLADE SAW SGTL HD 18.5X60.5X1. (BLADE) ×2 IMPLANT
BLADE SURG 10 STRL SS (BLADE) IMPLANT
BLADE SURG 21 STRL SS (BLADE) ×1 IMPLANT
BNDG COHESIVE 4X5 TAN STRL (GAUZE/BANDAGES/DRESSINGS) ×4 IMPLANT
BNDG GAUZE ELAST 4 BULKY (GAUZE/BANDAGES/DRESSINGS) ×4 IMPLANT
COVER SURGICAL LIGHT HANDLE (MISCELLANEOUS) ×4 IMPLANT
DRAPE U-SHAPE 47X51 STRL (DRAPES) ×2 IMPLANT
DRSG ADAPTIC 3X8 NADH LF (GAUZE/BANDAGES/DRESSINGS) ×2 IMPLANT
DRSG PAD ABDOMINAL 8X10 ST (GAUZE/BANDAGES/DRESSINGS) ×3 IMPLANT
DRSG VAC ATS SM SENSATRAC (GAUZE/BANDAGES/DRESSINGS) ×1 IMPLANT
DURAPREP 26ML APPLICATOR (WOUND CARE) ×2 IMPLANT
ELECT REM PT RETURN 9FT ADLT (ELECTROSURGICAL) ×2
ELECTRODE REM PT RTRN 9FT ADLT (ELECTROSURGICAL) ×1 IMPLANT
GAUZE SPONGE 4X4 12PLY STRL (GAUZE/BANDAGES/DRESSINGS) ×2 IMPLANT
GLOVE BIOGEL PI IND STRL 9 (GLOVE) ×1 IMPLANT
GLOVE BIOGEL PI INDICATOR 9 (GLOVE) ×1
GLOVE SURG ORTHO 9.0 STRL STRW (GLOVE) ×2 IMPLANT
GOWN STRL REUS W/ TWL XL LVL3 (GOWN DISPOSABLE) ×3 IMPLANT
GOWN STRL REUS W/TWL XL LVL3 (GOWN DISPOSABLE) ×6
KIT BASIN OR (CUSTOM PROCEDURE TRAY) ×2 IMPLANT
KIT PREVENA INCISION MGT 13 (CANNISTER) ×1 IMPLANT
KIT ROOM TURNOVER OR (KITS) ×2 IMPLANT
NS IRRIG 1000ML POUR BTL (IV SOLUTION) ×2 IMPLANT
PACK ORTHO EXTREMITY (CUSTOM PROCEDURE TRAY) ×2 IMPLANT
PAD ARMBOARD 7.5X6 YLW CONV (MISCELLANEOUS) ×4 IMPLANT
SPONGE LAP 18X18 X RAY DECT (DISPOSABLE) ×2 IMPLANT
SUT ETHILON 2 0 PSLX (SUTURE) ×6 IMPLANT
SUT VIC AB 2-0 CTB1 (SUTURE) ×1 IMPLANT
TOWEL OR 17X24 6PK STRL BLUE (TOWEL DISPOSABLE) ×2 IMPLANT
TOWEL OR 17X26 10 PK STRL BLUE (TOWEL DISPOSABLE) ×2 IMPLANT
WATER STERILE IRR 1000ML POUR (IV SOLUTION) ×2 IMPLANT

## 2016-06-12 NOTE — Op Note (Signed)
06/10/2016 - 06/12/2016  10:51 AM  PATIENT:  [name] Small    PRE-OPERATIVE DIAGNOSIS:  Gangrene Left Foot  POST-OPERATIVE DIAGNOSIS:  Same  PROCEDURE:  TRANSMETATARSAL AMPUTATION LEFT FOOT  SURGEON:  Newt Minion, MD  PHYSICIAN ASSISTANT:None ANESTHESIA:   General  PREOPERATIVE INDICATIONS:  [name] is a  82 y.o. male with a diagnosis of Infected Left Foot who failed conservative measures and elected for surgical management.    The risks benefits and alternatives were discussed with the patient preoperatively including but not limited to the risks of infection, bleeding, nerve injury, cardiopulmonary complications, the need for revision surgery, among others, and the patient was willing to proceed.  OPERATIVE IMPLANTS: Prevena wound VAC  OPERATIVE FINDINGS: Ischemic muscle, good petechial bleeding  OPERATIVE PROCEDURE: Patient brought the operating room and underwent a general anesthetic. After adequate levels anesthesia obtained patient's left lower extremity was prepped using DuraPrep draped into a sterile field a timeout was called. A fishmouth incision was made through the midfoot. This was carried down a oscillating saw was used to perform a transmetatarsal amputation. Electrocautery was used for hemostasis. Saline was used for irrigation. The wound was closed using 2-0 nylon. A Prevena wound VAC was applied a 0.2 mg per hour nitroglycerin patch was applied to improve microcirculation patient was extubated taken to the PACU in stable condition. Plan for nonweightbearing on the left lower extremity patient may require discharge to skilled care.

## 2016-06-12 NOTE — Anesthesia Preprocedure Evaluation (Addendum)
Anesthesia Evaluation  Patient identified by MRN, date of birth, ID band Patient awake    History of Anesthesia Complications Negative for: history of anesthetic complications  Airway Mallampati: II  TM Distance: >3 FB Neck ROM: Full    Dental  (+) Teeth Intact   Pulmonary shortness of breath, former smoker,    breath sounds clear to auscultation       Cardiovascular + CAD, + Peripheral Vascular Disease, +CHF and + DOE   Rhythm:Regular Rate:Normal     Neuro/Psych PSYCHIATRIC DISORDERS    GI/Hepatic   Endo/Other  diabetes  Renal/GU Renal disease     Musculoskeletal  (+) Arthritis ,   Abdominal   Peds  Hematology   Anesthesia Other Findings   Reproductive/Obstetrics                            Anesthesia Physical Anesthesia Plan  ASA: III  Anesthesia Plan: General   Post-op Pain Management:    Induction: Intravenous  Airway Management Planned: LMA  Additional Equipment:   Intra-op Plan:   Post-operative Plan: Extubation in OR  Informed Consent: I have reviewed the patients History and Physical, chart, labs and discussed the procedure including the risks, benefits and alternatives for the proposed anesthesia with the patient or authorized representative who has indicated his/her understanding and acceptance.   Dental advisory given  Plan Discussed with:   Anesthesia Plan Comments:         Anesthesia Quick Evaluation

## 2016-06-12 NOTE — Telephone Encounter (Signed)
Returning Cherly Hensen call regarding patient's pathology. Left a VM

## 2016-06-12 NOTE — H&P (View-Only) (Signed)
ORTHOPAEDIC CONSULTATION  REQUESTING PHYSICIAN: Louellen Molder, MD  Chief Complaint: Status post first ray amputation left foot with gangrene of the second toe.  HPI: [name] Small is a 82 y.o. male who presents with gangrene of the second toe. He is about 2 months out from a first ray amputation. Previous to the first ray amputation patient did have a balloon angioplasty by cardiology to improve the circulation to his foot. His most recent ankle-brachial indices that I was able to review shows an ABI of 0.83 with monophasic flow.  Past Medical History:  Diagnosis Date  . Arthritis   . Back pain    s/p lumbar injection 2014  . BENIGN PROSTATIC HYPERTROPHY 05/21/2007  . Bladder cancer ([place]) 10/2011  . CAD (coronary artery disease) CARDIOLOGIST- DR WALL - VISIT IN JUNE 2012 FOR CATH   nonobstructive by cath 6/12:  mid LAD 30%, proximal obtuse marginal-2 30%, proximal RCA 20%, mid RCA 30-40%.  He had normal cardiac output and mildly elevated filling pressures but no significant pulmonary hypertension;   Echocardiogram in May 2012 demonstrated EF 50-55% and left atrial enlargement   . Cellulitis of left foot    Hospitalized in 2006  . CHF (congestive heart failure) ([place])    per family, issues in 2012  . Chronic kidney disease (CKD), stage III (moderate) 12/04/2007   FOLLOWED BY DR PATEL  . Chronic pain of lower extremity   . DIABETES MELLITUS, TYPE II 05/21/2007  . DIVERTICULOSIS, COLON 05/21/2007   pt denies  . Fatigue AGE-RELATED  . Gross hematuria    pt denies  . HYPERTENSION 05/21/2007  . Impaired hearing BILATERAL HEARING AIDS  . On home oxygen therapy    "2L at nighttime" (02/28/2016)  . PAD (peripheral artery disease) ([place])   . Psoriasis ELBOWS  . PSORIASIS, SCALP 10/25/2008   Past Surgical History:  Procedure Laterality Date  . AMPUTATION Left 03/27/2016   Procedure: partial first AMPUTATION RAY; LEFT;  Surgeon: Trula Slade, DPM;  Location: [facility];  Service:  Podiatry;  Laterality: Left;  . APPENDECTOMY  1941  . CARDIAC CATHETERIZATION  04-24-11/  DR Rogue Jury ARIDA   MILD NONOBSTRUCTIVE CAD, NORMA CARDIAC OUTPUT  . CARDIOVASCULAR STRESS TEST  2007  . CATARACT EXTRACTION W/ INTRAOCULAR LENS  IMPLANT, BILATERAL Bilateral ~ 2010  . CYSTOSCOPY  12/25/2011   Procedure: CYSTOSCOPY;  Surgeon: Molli Hazard, MD;  Location: [facility];  Service: Urology;  Laterality: N/A;  needs intubation and to be paralyzed   . CYSTOSCOPY W/ RETROGRADES  10/30/2011   Procedure: CYSTOSCOPY WITH RETROGRADE PYELOGRAM;  Surgeon: Molli Hazard, MD;  Location: [facility];  Service: Urology;  Laterality: Bilateral;  CYSTOSCOPY POSS TURBT BILATERAL RETROGRADE PYLEOGRAM   . CYSTOSCOPY W/ RETROGRADES  11/30/2012   Procedure: CYSTOSCOPY WITH RETROGRADE PYELOGRAM;  Surgeon: Molli Hazard, MD;  Location: [facility];  Service: Urology;  Laterality: Bilateral;  Flexible cystoscopy.  . CYSTOSCOPY WITH BIOPSY  11/30/2012   Procedure: CYSTOSCOPY WITH BIOPSY;  Surgeon: Molli Hazard, MD;  Location: [facility];  Service: Urology;  Laterality: N/A;  . INCISION AND DRAINAGE FOOT Left ~ 2005   LEFT FOOT DUE TO INFECTION FROM  NAIL PUNCTURE INJURY  . PENILE PROSTHESIS IMPLANT  1990s  . PERIPHERAL VASCULAR CATHETERIZATION N/A 02/14/2016   Procedure: Abdominal Aortogram w/Lower Extremity;  Surgeon: Wellington Hampshire, MD;  Location: [facility];  Service: Cardiovascular;  Laterality: N/A;  .  PERIPHERAL VASCULAR CATHETERIZATION Left 02/28/2016   Procedure: Peripheral Vascular Balloon Angioplasty;  Surgeon: Wellington Hampshire, MD;  Location: [facility];  Service: Cardiovascular;  Laterality: Left;  left peroneal and popliteal artery  . [place]  . TRANSURETHRAL RESECTION OF BLADDER TUMOR  10/30/2011   Procedure: TRANSURETHRAL RESECTION OF BLADDER TUMOR (TURBT);   Surgeon: Molli Hazard, MD;  Location: [facility];  Service: Urology;  Laterality: N/A;  . TRANSURETHRAL RESECTION OF BLADDER TUMOR  12/25/2011   Procedure: TRANSURETHRAL RESECTION OF BLADDER TUMOR (TURBT);  Surgeon: Molli Hazard, MD;  Location: [facility];  Service: Urology;  Laterality: N/A;  need long gyrus instruments   . VASECTOMY  1990s   Social History   Social History  . Marital status: Married    Spouse name: N/A  . Number of children: 4  . Years of education: N/A   Occupational History  . retired      [employer]	[place].   Social History Main Topics  . Smoking status: Former Smoker    Packs/day: 2.00    Years: 22.00    Types: Cigarettes    Quit date: 11/11/1968  . Smokeless tobacco: Never Used  . Alcohol use 1.2 oz/week    2 Glasses of wine per week     Comment: occassional  . Drug use: No  . Sexual activity: Not Currently   Other Topics Concern  . None   Social History Narrative   Retired Event organiser.   Active with golf.   [place] [phone].     Family History  Problem Relation Age of Onset  . Diabetes Mother   . Heart disease Brother   . Heart disease Brother   . Heart disease Brother   . Heart disease Brother   . Heart disease Brother   . Heart disease Brother   . Lung cancer Brother    - negative except otherwise stated in the family history section Allergies  Allergen Reactions  . Actos [Pioglitazone Hydrochloride] Other (See Comments)    Held 2012 bladder cancer  . Aspirin Other (See Comments)    Held 2012 due to hematuria and bruising.   . Ace Inhibitors Cough  . Gabapentin     Upset stomach and diarrhea - tolerates low doses  . Lyrica [Pregabalin] Other (See Comments)    swelling  . Pravastatin Other (See Comments)    Swelling of feet  . Bactrim Rash and Other (See Comments)    Rash, presumed allergy  . Sulfa Drugs Cross Reactors Rash   Prior to Admission medications     Medication Sig Start Date End Date Taking? Authorizing Provider  acetaminophen (TYLENOL) 500 MG tablet Take 1,000 mg by mouth every 8 (eight) hours as needed for mild pain, moderate pain or headache.   Yes Historical Provider, MD  amLODipine (NORVASC) 5 MG tablet Take 1 tablet (5 mg total) by mouth daily. 11/10/15  Yes Tonia Ghent, MD  atorvastatin (LIPITOR) 10 MG tablet Take 1 tablet (10 mg total) by mouth daily. 02/23/16  Yes Wellington Hampshire, MD  budesonide-formoterol (SYMBICORT) 160-4.5 MCG/ACT inhaler Inhale 2 puffs into the lungs 2 (two) times daily as needed (shortness of breath).  01/21/14  Yes Tonia Ghent, MD  ciprofloxacin (CIPRO) 500 MG tablet Take 1 tablet by mouth 2 (two) times daily. 03/21/16  Yes Historical Provider, MD  clindamycin (CLEOCIN) 300 MG capsule Take 1 capsule (300 mg total)  by mouth 3 (three) times daily. 06/07/16  Yes Trula Slade, DPM  clobetasol (TEMOVATE) 0.05 % external solution Apply 1 application topically daily.   Yes Historical Provider, MD  clopidogrel (PLAVIX) 75 MG tablet Take 1 tablet by mouth  daily 02/08/16  Yes Tonia Ghent, MD  collagenase (SANTYL) ointment Apply 1 application topically daily.   Yes Historical Provider, MD  furosemide (LASIX) 40 MG tablet TAKE 1 TABLET BY MOUTH  EVERY MONDAY, WEDNESDAY,  AND FRIDAY 12/01/15  Yes Tonia Ghent, MD  gabapentin (NEURONTIN) 100 MG capsule Take 100 mg by mouth 3 (three) times daily.   Yes Historical Provider, MD  glipiZIDE (GLUCOTROL XL) 5 MG 24 hr tablet Take 1 tablet by mouth  daily with breakfast 04/26/16  Yes Tonia Ghent, MD  insulin glargine (LANTUS) 100 UNIT/ML injection Inject 0.1 mLs (10 Units total) into the skin daily. 04/23/16  Yes Tonia Ghent, MD  ipratropium-albuterol (DUONEB) 0.5-2.5 (3) MG/3ML SOLN Take 3 mLs by nebulization every 6 (six) hours as needed. 03/29/16  Yes Oswald Hillock, MD  polyethylene glycol (MIRALAX / GLYCOLAX) packet Take 17 g by mouth daily as needed. 03/29/16   Yes Oswald Hillock, MD  tiotropium (SPIRIVA) 18 MCG inhalation capsule Place 18 mcg into inhaler and inhale daily.   Yes Historical Provider, MD   Dg Chest Port 1 View  Result Date: 06/10/2016 CLINICAL DATA:  Shortness of breath.  History of osteomyelitis EXAM: PORTABLE CHEST 1 VIEW COMPARISON:  January 20, 2014 FINDINGS: There is no edema or consolidation. Heart is upper normal in size with pulmonary vascularity within normal limits. There is atherosclerotic calcification in the aortic arch. No bone lesions. IMPRESSION: Aortic atherosclerosis.  No edema or consolidation. Electronically Signed   By: Lowella Grip III M.D.   On: 06/10/2016 14:02   - pertinent xrays, CT, MRI studies were reviewed and independently interpreted  Positive ROS: All other systems have been reviewed and were otherwise negative with the exception of those mentioned in the HPI and as above.  Physical Exam: General: Alert, no acute distress Cardiovascular: No pedal edema Respiratory: No cyanosis, no use of accessory musculature GI: No organomegaly, abdomen is soft and non-tender Skin: Patient has gangrene of the second toe with exposed tuft. There is no ascending cellulitis. No purulent drainage. Neurologic: Patient does not have protective sensation. Psychiatric: Patient is competent for consent with normal mood and affect Lymphatic: No axillary or cervical lymphadenopathy  MUSCULOSKELETAL:  Objective examination patient does have a palpable dorsalis pedis pulse he has some brawny skin color changes in his foot but no significant venous stasis swelling. The surgical incision is well-healed the toe is black and gangrenous with exposed bone. There is no purulent drainage no abscess. There is no gangrenous changes of toes 3 and 4 and 5. I reviewed his radiographs postoperatively from the first ray amputation and this showed no osteomyelitis of his digits.  Assessment: Assessment: Diabetic insensate neuropathy with  peripheral vascular disease status post first ray amputation of the left foot great toe status post balloon angioplasty with black gangrenous changes to the second toe. Patient currently does not smoke.   Plan: Plan: I discussed with the patient additional options they state there is currently considering a second toe amputation with Dr. Earleen Newport. I discussed with his severe microcirculatory disease that in my opinion he may be better off with a transmetatarsal amputation. Discussed that he still is at increased risk of the wound not healing but  he would be less likely to have further gangrenous changes to the other toes. Would use a wound VAC and nitroglycerin patches postoperatively. After a long discussion the patient and his family stated that they would rather proceed with more aggressive surgery then risk having to come back again for another toe amputation. We will make the patient nothing by mouth and plan for transmetatarsal amputation tomorrow. Risks and benefits were discussed including the risk of the wound not healing. Discussed the importance of nonweightbearing postoperatively.   Thank you for the consult and the opportunity to see [name] Small, [place] [phone] 5:31 PM

## 2016-06-12 NOTE — Progress Notes (Signed)
PROGRESS NOTE                                                                                                                                                                                                             Patient Demographics:    [name] Small, is a 82 y.o. male, DOB - 1928-04-27, [id number]  Admit date - 06/10/2016   Admitting Physician Waldemar Dickens, MD  Outpatient Primary MD for the patient is [name], MD  LOS - 2  Outpatient Specialists: Dr Jacqualyn Posey ( podiatry)  Chief Complaint  Patient presents with  . Toe Pain       Brief Narrative  82 year old male with history of coronary artery disease, CHF, diabetes mellitus, PVD, hypertension, BPH, history of bladder cancer, diabetic foot ulcer with osteomyelitis of left great toe status post partial first ray amputation in May this year, who presented with osteomyelitis of the left second toe with failed outpatient therapy. He was following with Dr. Jacqualyn Posey (podiatrist). He initially had a blister with a wound when seen in the office 3 days back on the second toe and was started on clindamycin and ciprofloxacin. Symptoms worsened and he was instructed to come to the ED. Patient planned for amputation of the left second toe on 8/2.   Subjective:   Patient denies any pain, Dyspnea or chest pain(seen before surgery)   Assessment  & Plan :   Diabetic Osteomyelitis Of left second toe (HCC) - Empiric vancomycin and Zosyn. - Patient is followed as an outpatient by Dr. Jacqualyn Posey. - Blood cultures so far negative. Remains afebrile. One cultures sent by Dr. Jacqualyn Posey on 7/28 (I do not see any results in the system). - Orthopedic consult obtained per family request for second opinion, status post transmetatarsal amputation 8/2 2017.  Uncontrolled Diabetes mellitus with neurologic complications ([place]) - Continue with Lantus and insulin sliding scale, A1c is 9.3    - Continue Neurontin  Acute on  Chronic kidney disease, stage III (moderate) - Place on gentle IV hydration. Hold Lasix and avoid nephrotoxins.  BPH (benign prostatic hyperplasia)  CAD (coronary artery disease) of artery bypass graft - Continue statin.  Peripheral vascular disease - Resume Plavix when cleared by orthopedic   History of bladder cancer  status post chemotherapy  COPD Stable. Continue home inhalers   ** There was mention  of A. fib on H&P this admission, as well  through previous medical records, reviewed previous EKGs, no evidence of atrial fibrillation  Code Status : Full code  Family Communication  : Wife at bedside  Disposition Plan  : Pending hospital course  Consults  :   Podiatry Orthopedics (Dr. Sharol Given)  Procedures  : None  DVT Prophylaxis  :  Subcutaneous heparin  Lab Results  Component Value Date   PLT 321 06/11/2016    Antibiotics  :    Anti-infectives    Start     Dose/Rate Route Frequency Ordered Stop   06/11/16 1200  vancomycin (VANCOCIN) IVPB 1000 mg/200 mL premix  Status:  Discontinued     1,000 mg 200 mL/hr over 60 Minutes Intravenous Every 24 hours 06/10/16 1224 06/10/16 1309   06/11/16 1200  vancomycin (VANCOCIN) 1,250 mg in sodium chloride 0.9 % 250 mL IVPB     1,250 mg 166.7 mL/hr over 90 Minutes Intravenous Every 24 hours 06/10/16 1309     06/10/16 1400  piperacillin-tazobactam (ZOSYN) IVPB 3.375 g     3.375 g 12.5 mL/hr over 240 Minutes Intravenous Every 8 hours 06/10/16 1307     06/10/16 1130  vancomycin (VANCOCIN) IVPB 1000 mg/200 mL premix     1,000 mg 200 mL/hr over 60 Minutes Intravenous  Once 06/10/16 1129 06/10/16 1451   06/10/16 1130  piperacillin-tazobactam (ZOSYN) IVPB 3.375 g     3.375 g 100 mL/hr over 30 Minutes Intravenous  Once 06/10/16 1129 06/10/16 1330        Objective:   Vitals:   06/12/16 1127 06/12/16 1130 06/12/16 1142 06/12/16 1155  BP: (!) 109/49   (!) 116/47  Pulse: 60 61  63  Resp: 12 14   16   Temp:   98.4 F (36.9 C) 98.4 F (36.9 C)  TempSrc:    Oral  SpO2: 99% 100%  100%  Weight:      Height:        Wt Readings from Last 3 Encounters:  06/11/16 91.2 kg (201 lb 1 oz)  05/08/16 95 kg (209 lb 8 oz)  05/03/16 95.4 kg (210 lb 4 oz)     Intake/Output Summary (Last 24 hours) at 06/12/16 1249 Last data filed at 06/12/16 1215  Gross per 24 hour  Intake          2378.75 ml  Output              215 ml  Net          2163.75 ml     Physical Exam  Gen: not in distress HEENT:  moist mucosa, supple neck Chest: clear b/l, no added sounds CVS: N S1&S2, no murmurs, rubs or gallop GI: soft, NT, ND, BS+ Musculoskeletal: warm, Gangrenous left second toe, status post left great toe amputation, adequate distal pulses (seen before Sx) CNS: Alert and oriented    Data Review:    CBC  Recent Labs Lab 06/10/16 1149 06/11/16 0534  WBC 9.0 10.2  HGB 13.9 12.2*  HCT 42.4 38.4*  PLT 319 321  MCV 97.7 98.5  MCH 32.0 31.3  MCHC 32.8 31.8  RDW 12.8 13.1  LYMPHSABS 1.3  --   MONOABS 0.8  --   EOSABS 0.3  --   BASOSABS 0.1  --     Chemistries   Recent Labs Lab 06/10/16 1133 06/11/16 0534 06/12/16 0612  NA 137 135 138  K 4.8 5.0 4.4  CL 99* 99* 100*  CO2  30 31 30   GLUCOSE 270* 269* 99  BUN 28* 34* 26*  CREATININE 1.84* 2.10* 2.05*  CALCIUM 9.2 8.6* 8.4*   ------------------------------------------------------------------------------------------------------------------ No results for input(s): CHOL, HDL, LDLCALC, TRIG, CHOLHDL, LDLDIRECT in the last 72 hours.  Lab Results  Component Value Date   HGBA1C 9.3 (H) 06/10/2016   ------------------------------------------------------------------------------------------------------------------ No results for input(s): TSH, T4TOTAL, T3FREE, THYROIDAB in the last 72 hours.  Invalid input(s):  FREET3 ------------------------------------------------------------------------------------------------------------------ No results for input(s): VITAMINB12, FOLATE, FERRITIN, TIBC, IRON, RETICCTPCT in the last 72 hours.  Coagulation profile  Recent Labs Lab 06/10/16 1330  INR 1.09    No results for input(s): DDIMER in the last 72 hours.  Cardiac Enzymes No results for input(s): CKMB, TROPONINI, MYOGLOBIN in the last 168 hours.  Invalid input(s): CK ------------------------------------------------------------------------------------------------------------------    Component Value Date/Time   BNP 125.3 (H) 03/27/2016 2102    Inpatient Medications  Scheduled Meds: . amLODipine  5 mg Oral Daily  . atorvastatin  10 mg Oral Daily  . gabapentin  100 mg Oral TID  . insulin aspart  0-15 Units Subcutaneous TID WC  . insulin aspart  0-5 Units Subcutaneous QHS  . insulin aspart  3 Units Subcutaneous TID WC  . insulin glargine  18 Units Subcutaneous QHS  . mometasone-formoterol  2 puff Inhalation BID  . nitroGLYCERIN  0.2 mg Transdermal Daily  . piperacillin-tazobactam (ZOSYN)  IV  3.375 g Intravenous Q8H  . tiotropium  18 mcg Inhalation Daily  . vancomycin  1,250 mg Intravenous Q24H   Continuous Infusions: . sodium chloride 75 mL/hr at 06/11/16 1638  . sodium chloride     PRN Meds:.acetaminophen **OR** acetaminophen, bisacodyl, ipratropium-albuterol, ondansetron **OR** ondansetron (ZOFRAN) IV, polyethylene glycol, zolpidem  Micro Results Recent Results (from the past 240 hour(s))  Blood culture (routine x 2)     Status: None (Preliminary result)   Collection Time: 06/10/16 11:33 AM  Result Value Ref Range Status   Specimen Description BLOOD RIGHT ANTECUBITAL  Final   Special Requests BOTTLES DRAWN AEROBIC AND ANAEROBIC  5CC  Final   Culture NO GROWTH 1 DAY  Final   Report Status PENDING  Incomplete  Blood culture (routine x 2)     Status: None (Preliminary result)    Collection Time: 06/10/16 12:00 PM  Result Value Ref Range Status   Specimen Description BLOOD RIGHT HAND  Final   Special Requests BOTTLES DRAWN AEROBIC AND ANAEROBIC  5CC  Final   Culture NO GROWTH 1 DAY  Final   Report Status PENDING  Incomplete  Surgical pcr screen     Status: None   Collection Time: 06/11/16  7:47 PM  Result Value Ref Range Status   MRSA, PCR NEGATIVE NEGATIVE Final   Staphylococcus aureus NEGATIVE NEGATIVE Final    Comment:        The Xpert SA Assay (FDA approved for NASAL specimens in patients over 23 years of age), is one component of a comprehensive surveillance program.  Test performance has been validated by [facility] for patients greater than or equal to 45 year old. It is not intended to diagnose infection nor to guide or monitor treatment.     Radiology Reports Dg Chest Port 1 View  Result Date: 06/10/2016 CLINICAL DATA:  Shortness of breath.  History of osteomyelitis EXAM: PORTABLE CHEST 1 VIEW COMPARISON:  January 20, 2014 FINDINGS: There is no edema or consolidation. Heart is upper normal in size with pulmonary vascularity within normal limits. There is atherosclerotic calcification in  the aortic arch. No bone lesions. IMPRESSION: Aortic atherosclerosis.  No edema or consolidation. Electronically Signed   By: Lowella Grip III M.D.   On: 06/10/2016 14:02     Luca Dyar M.D on 06/12/2016 at 12:49 PM  Between 7am to 7pm - Pager - [phone]  After 7pm go to [email or web address] - password [facility]  [facility] -  Office  [phone]

## 2016-06-12 NOTE — Progress Notes (Signed)
Orthopedic Tech Progress Note Patient Details:  [name] Small [birth year] [id number]  Ortho Devices Type of Ortho Device: Postop shoe/boot Ortho Device/Splint Location: LLE Ortho Device/Splint Interventions: Ordered, Application   Braulio Bosch 06/12/2016, 8:44 PM

## 2016-06-12 NOTE — Anesthesia Postprocedure Evaluation (Signed)
Anesthesia Post Note  Patient: ROXY KEMME  Procedure(s) Performed: Procedure(s) (LRB): TRANSMETATARSAL AMPUTATION LEFT FOOT (Left)  Patient location during evaluation: PACU Anesthesia Type: General Level of consciousness: awake and alert Pain management: pain level controlled Vital Signs Assessment: post-procedure vital signs reviewed and stable Respiratory status: spontaneous breathing, nonlabored ventilation, respiratory function stable and patient connected to nasal cannula oxygen Cardiovascular status: blood pressure returned to baseline and stable Postop Assessment: no signs of nausea or vomiting Anesthetic complications: no    Last Vitals:  Vitals:   06/12/16 1142 06/12/16 1155  BP:  (!) 116/47  Pulse:  63  Resp:  16  Temp: 36.9 C 36.9 C    Last Pain:  Vitals:   06/12/16 1155  TempSrc: Oral  PainSc:                  Ankush Gintz,JAMES TERRILL

## 2016-06-12 NOTE — Interval H&P Note (Signed)
History and Physical Interval Note:  06/12/2016 6:59 AM  [name] Small  has presented today for surgery, with the diagnosis of Infected Left Foot  The various methods of treatment have been discussed with the patient and family. After consideration of risks, benefits and other options for treatment, the patient has consented to  Procedure(s): TRANSMETATARSAL AMPUTATION LEFT FOOT (Left) as a surgical intervention .  The patient's history has been reviewed, patient examined, no change in status, stable for surgery.  I have reviewed the patient's chart and labs.  Questions were answered to the patient's satisfaction.     Axavier Pressley V

## 2016-06-12 NOTE — Transfer of Care (Signed)
Immediate Anesthesia Transfer of Care Note  Patient: [name] Small  Procedure(s) Performed: Procedure(s): TRANSMETATARSAL AMPUTATION LEFT FOOT (Left)  Patient Location: PACU  Anesthesia Type:General  Level of Consciousness: awake, alert  and oriented  Airway & Oxygen Therapy: Patient Spontanous Breathing and Patient connected to nasal cannula oxygen  Post-op Assessment: Report given to RN and Post -op Vital signs reviewed and stable  Post vital signs: Reviewed and stable  Last Vitals:  Vitals:   06/12/16 0501 06/12/16 0844  BP: (!) 120/56 (!) 125/57  Pulse: (!) 58 79  Resp: 19 20  Temp: 36.3 C 36.8 C    Last Pain:  Vitals:   06/12/16 0844  TempSrc: Tympanic  PainSc:          Complications: No apparent anesthesia complications

## 2016-06-12 NOTE — Anesthesia Procedure Notes (Signed)
Procedure Name: LMA Insertion Date/Time: 06/12/2016 10:06 AM Performed by: Mariea Clonts Pre-anesthesia Checklist: Patient identified, Emergency Drugs available, Suction available, Patient being monitored and Timeout performed Patient Re-evaluated:Patient Re-evaluated prior to inductionOxygen Delivery Method: Circle system utilized Preoxygenation: Pre-oxygenation with 100% oxygen Intubation Type: IV induction LMA: LMA inserted LMA Size: 5.0 Placement Confirmation: positive ETCO2 and breath sounds checked- equal and bilateral Tube secured with: Tape Dental Injury: Teeth and Oropharynx as per pre-operative assessment

## 2016-06-13 ENCOUNTER — Encounter (HOSPITAL_COMMUNITY): Payer: Self-pay | Admitting: Orthopedic Surgery

## 2016-06-13 LAB — CBC
HCT: 36.8 % — ABNORMAL LOW (ref 39.0–52.0)
Hemoglobin: 11.5 g/dL — ABNORMAL LOW (ref 13.0–17.0)
MCH: 31.5 pg (ref 26.0–34.0)
MCHC: 31.3 g/dL (ref 30.0–36.0)
MCV: 100.8 fL — ABNORMAL HIGH (ref 78.0–100.0)
Platelets: 331 10*3/uL (ref 150–400)
RBC: 3.65 MIL/uL — ABNORMAL LOW (ref 4.22–5.81)
RDW: 13 % (ref 11.5–15.5)
WBC: 10.7 10*3/uL — ABNORMAL HIGH (ref 4.0–10.5)

## 2016-06-13 LAB — BASIC METABOLIC PANEL
Anion gap: 7 (ref 5–15)
BUN: 25 mg/dL — ABNORMAL HIGH (ref 6–20)
CO2: 30 mmol/L (ref 22–32)
Calcium: 8 mg/dL — ABNORMAL LOW (ref 8.9–10.3)
Chloride: 97 mmol/L — ABNORMAL LOW (ref 101–111)
Creatinine, Ser: 2.41 mg/dL — ABNORMAL HIGH (ref 0.61–1.24)
GFR calc Af Amer: 27 mL/min — ABNORMAL LOW (ref >60)
GFR calc non Af Amer: 23 mL/min — ABNORMAL LOW (ref >60)
Glucose, Bld: 290 mg/dL — ABNORMAL HIGH (ref 65–99)
Potassium: 5 mmol/L (ref 3.5–5.1)
Sodium: 134 mmol/L — ABNORMAL LOW (ref 135–145)

## 2016-06-13 LAB — GLUCOSE, CAPILLARY
Glucose-Capillary: 283 mg/dL — ABNORMAL HIGH (ref 65–99)
Glucose-Capillary: 205 mg/dL — ABNORMAL HIGH (ref 65–99)
Glucose-Capillary: 108 mg/dL — ABNORMAL HIGH (ref 65–99)
Glucose-Capillary: 174 mg/dL — ABNORMAL HIGH (ref 65–99)

## 2016-06-13 MED ORDER — POLYETHYLENE GLYCOL 3350 17 G PO PACK
17.0000 g | PACK | Freq: Two times a day (BID) | ORAL | Status: AC
  Administered 2016-06-13: 17 g via ORAL
  Filled 2016-06-13: qty 1

## 2016-06-13 MED ORDER — SODIUM CHLORIDE 0.9 % IV SOLN
INTRAVENOUS | Status: AC
  Administered 2016-06-13: 08:00:00 via INTRAVENOUS

## 2016-06-13 MED ORDER — VANCOMYCIN HCL IN DEXTROSE 750-5 MG/150ML-% IV SOLN
750.0000 mg | INTRAVENOUS | Status: DC
  Administered 2016-06-13: 750 mg via INTRAVENOUS
  Filled 2016-06-13: qty 150

## 2016-06-13 MED ORDER — INSULIN ASPART 100 UNIT/ML ~~LOC~~ SOLN
6.0000 [IU] | Freq: Three times a day (TID) | SUBCUTANEOUS | Status: DC
  Administered 2016-06-13 – 2016-06-14 (×3): 6 [IU] via SUBCUTANEOUS

## 2016-06-13 NOTE — Clinical Social Work Note (Signed)
CSW acknowledges SNF consult. RNCM following for HHPT.  Dayton Scrape, [place]

## 2016-06-13 NOTE — Progress Notes (Signed)
Pharmacy Antibiotic Note  [name] Small is a 82 y.o. male admitted on 06/10/2016 with cellulitis now s/p amputation with portable wound VAC  Scr has continued to trend up Afebrile  Day # 4 of broad spectrum antibiotics   Plan: Decrease Vancomycin to 750 mg iv Q 24 hours Continue Zosyn 3.375 grams iv Q 8 hours  Consider de-escalating antibiotics or stopping.   Height: 6\' 2"  (188 cm) Weight: 201 lb 1 oz (91.2 kg) IBW/kg (Calculated) : 82.2  Temp (24hrs), Avg:98 F (36.7 C), Min:97.2 F (36.2 C), Max:98.6 F (37 C)   Recent Labs Lab 06/10/16 1133 06/10/16 1149 06/11/16 0534 06/12/16 0612 06/13/16 0601  WBC  --  9.0 10.2  --  10.7*  CREATININE 1.84*  --  2.10* 2.05* 2.41*    Estimated Creatinine Clearance: 26.1 mL/min (by C-G formula based on SCr of 2.41 mg/dL).    Allergies  Allergen Reactions  . Actos [Pioglitazone Hydrochloride] Other (See Comments)    Held 2012 bladder cancer  . Aspirin Other (See Comments)    Held 2012 due to hematuria and bruising.   . Ace Inhibitors Cough  . Gabapentin     Upset stomach and diarrhea - tolerates low doses  . Lyrica [Pregabalin] Other (See Comments)    swelling  . Pravastatin Other (See Comments)    Swelling of feet  . Bactrim Rash and Other (See Comments)    Rash, presumed allergy  . Sulfa Drugs Cross Reactors Rash    Antimicrobials this admission: vanc 7/31 >>  zosyn 7/31 >>  Dose adjustments this admission: 8/3->Vancomycin decreased  Microbiology results: 7/31 BCx: NGTD  Thank you Anette Guarneri, PharmD [phone] 06/13/2016 10:44 AM

## 2016-06-13 NOTE — Progress Notes (Signed)
PROGRESS NOTE                                                                                                                                                                                                             Patient Demographics:    [name] Small, is a 82 y.o. male, DOB - 1928-01-23, [id number]  Admit date - 06/10/2016   Admitting Physician Waldemar Dickens, MD  Outpatient Primary MD for the patient is [name], MD  LOS - 3  Outpatient Specialists: Dr Jacqualyn Posey ( podiatry)  Chief Complaint  Patient presents with  . Toe Pain       Brief Narrative  82 year old male with history of coronary artery disease, CHF, diabetes mellitus, PVD, hypertension, BPH, history of bladder cancer, diabetic foot ulcer with osteomyelitis of left great toe status post partial first ray amputation in May this year, who presented with osteomyelitis of the left second toe with failed outpatient therapy. He was following with Dr. Jacqualyn Posey (podiatrist). He initially had a blister with a wound when seen in the office 3 days back on the second toe and was started on clindamycin and ciprofloxacin. Symptoms worsened and he was instructed to come to the ED. HEENT by Dr. Sharol Given, and went for a transmetatarsal left foot amputation on 8/2 by Dr. Sharol Given.   Subjective:   Patient denies any pain, Dyspnea or chest pain.   Assessment  & Plan :   Diabetic Osteomyelitis Of left second toe (HCC) - Empiric vancomycin and Zosyn. - Patient is followed as an outpatient by Dr. Jacqualyn Posey. - Blood cultures so far negative. Remains afebrile. One cultures sent by Dr. Jacqualyn Posey on 7/28 (I do not see any results in the system). - Orthopedic consult obtained per family request for second opinion, status post transmetatarsal amputation 06/12/2016., Continue with wound VAC and Nitro patch.  Uncontrolled Diabetes mellitus with neurologic complications ([facility]) - Continue  with Lantus and insulin sliding scale, A1c is 9.3  - Continue Neurontin  Acute on  Chronic kidney disease, stage III (moderate) - Place on gentle IV hydration. Hold Lasix and avoid nephrotoxins.  BPH (benign prostatic hyperplasia)  CAD (coronary artery disease) of artery bypass graft - Continue statin.  Peripheral vascular disease - Resume Plavix when cleared by orthopedic   History of bladder cancer  status post chemotherapy  COPD Stable.  Continue home inhalers   ** There was mention of A. fib on H&P this admission, as well  through previous medical records, reviewed previous EKGs, no evidence of atrial fibrillation  Code Status : Full code  Family Communication  : Wife at bedside  Disposition Plan  : Pending PT consult, hopefully tomorrow home versus SNF  Consults  :   Podiatry Orthopedics (Dr. Sharol Given)  Procedures  : None  DVT Prophylaxis  :  Subcutaneous heparin  Lab Results  Component Value Date   PLT 331 06/13/2016    Antibiotics  :    Anti-infectives    Start     Dose/Rate Route Frequency Ordered Stop   06/13/16 1200  vancomycin (VANCOCIN) IVPB 750 mg/150 ml premix     750 mg 150 mL/hr over 60 Minutes Intravenous Every 24 hours 06/13/16 1051     06/11/16 1200  vancomycin (VANCOCIN) IVPB 1000 mg/200 mL premix  Status:  Discontinued     1,000 mg 200 mL/hr over 60 Minutes Intravenous Every 24 hours 06/10/16 1224 06/10/16 1309   06/11/16 1200  vancomycin (VANCOCIN) 1,250 mg in sodium chloride 0.9 % 250 mL IVPB  Status:  Discontinued     1,250 mg 166.7 mL/hr over 90 Minutes Intravenous Every 24 hours 06/10/16 1309 06/13/16 1051   06/10/16 1400  piperacillin-tazobactam (ZOSYN) IVPB 3.375 g     3.375 g 12.5 mL/hr over 240 Minutes Intravenous Every 8 hours 06/10/16 1307     06/10/16 1130  vancomycin (VANCOCIN) IVPB 1000 mg/200 mL premix     1,000 mg 200 mL/hr over 60 Minutes Intravenous  Once 06/10/16 1129 06/10/16 1451   06/10/16 1130   piperacillin-tazobactam (ZOSYN) IVPB 3.375 g     3.375 g 100 mL/hr over 30 Minutes Intravenous  Once 06/10/16 1129 06/10/16 1330        Objective:   Vitals:   06/12/16 2108 06/12/16 2120 06/13/16 0557 06/13/16 0912  BP: (!) 107/50  (!) 106/48 (!) 108/59  Pulse: 82  87 75  Resp: 18  18 18   Temp: 98.6 F (37 C)  98.3 F (36.8 C) 97.7 F (36.5 C)  TempSrc: Oral  Oral Oral  SpO2: 91% 94% 92% 92%  Weight:      Height:        Wt Readings from Last 3 Encounters:  06/11/16 91.2 kg (201 lb 1 oz)  05/08/16 95 kg (209 lb 8 oz)  05/03/16 95.4 kg (210 lb 4 oz)     Intake/Output Summary (Last 24 hours) at 06/13/16 1322 Last data filed at 06/13/16 0912  Gross per 24 hour  Intake           898.67 ml  Output              375 ml  Net           523.67 ml     Physical Exam  Gen: not in distress HEENT:  moist mucosa, supple neck Chest: clear b/l, no added sounds CVS: N S1&S2, no murmurs, rubs or gallop GI: soft, NT, ND, BS+ Musculoskeletal: Left transmetatarsal amputation with wound VAC on site. CNS: Alert and oriented    Data Review:    CBC  Recent Labs Lab 06/10/16 1149 06/11/16 0534 06/13/16 0601  WBC 9.0 10.2 10.7*  HGB 13.9 12.2* 11.5*  HCT 42.4 38.4* 36.8*  PLT 319 321 331  MCV 97.7 98.5 100.8*  MCH 32.0 31.3 31.5  MCHC 32.8 31.8 31.3  RDW 12.8 13.1 13.0  LYMPHSABS 1.3  --   --   MONOABS 0.8  --   --   EOSABS 0.3  --   --   BASOSABS 0.1  --   --     Chemistries   Recent Labs Lab 06/10/16 1133 06/11/16 0534 06/12/16 0612 06/13/16 0601  NA 137 135 138 134*  K 4.8 5.0 4.4 5.0  CL 99* 99* 100* 97*  CO2 30 31 30 30   GLUCOSE 270* 269* 99 290*  BUN 28* 34* 26* 25*  CREATININE 1.84* 2.10* 2.05* 2.41*  CALCIUM 9.2 8.6* 8.4* 8.0*   ------------------------------------------------------------------------------------------------------------------ No results for input(s): CHOL, HDL, LDLCALC, TRIG, CHOLHDL, LDLDIRECT in the last 72 hours.  Lab Results    Component Value Date   HGBA1C 9.3 (H) 06/10/2016   ------------------------------------------------------------------------------------------------------------------ No results for input(s): TSH, T4TOTAL, T3FREE, THYROIDAB in the last 72 hours.  Invalid input(s): FREET3 ------------------------------------------------------------------------------------------------------------------ No results for input(s): VITAMINB12, FOLATE, FERRITIN, TIBC, IRON, RETICCTPCT in the last 72 hours.  Coagulation profile  Recent Labs Lab 06/10/16 1330  INR 1.09    No results for input(s): DDIMER in the last 72 hours.  Cardiac Enzymes No results for input(s): CKMB, TROPONINI, MYOGLOBIN in the last 168 hours.  Invalid input(s): CK ------------------------------------------------------------------------------------------------------------------    Component Value Date/Time   BNP 125.3 (H) 03/27/2016 2102    Inpatient Medications  Scheduled Meds: . amLODipine  5 mg Oral Daily  . atorvastatin  10 mg Oral Daily  . gabapentin  100 mg Oral TID  . insulin aspart  0-15 Units Subcutaneous TID WC  . insulin aspart  0-5 Units Subcutaneous QHS  . insulin aspart  3 Units Subcutaneous TID WC  . insulin glargine  18 Units Subcutaneous QHS  . mometasone-formoterol  2 puff Inhalation BID  . nitroGLYCERIN  0.2 mg Transdermal Daily  . piperacillin-tazobactam (ZOSYN)  IV  3.375 g Intravenous Q8H  . polyethylene glycol  17 g Oral BID  . tiotropium  18 mcg Inhalation Daily  . vancomycin  750 mg Intravenous Q24H   Continuous Infusions: . sodium chloride    . sodium chloride 10 mL/hr at 06/12/16 2024  . sodium chloride 50 mL/hr at 06/13/16 0823   PRN Meds:.acetaminophen **OR** acetaminophen, bisacodyl, ipratropium-albuterol, methocarbamol **OR** methocarbamol (ROBAXIN)  IV, metoCLOPramide **OR** metoCLOPramide (REGLAN) injection, [DISCONTINUED] ondansetron **OR** ondansetron (ZOFRAN) IV, ondansetron **OR**  [DISCONTINUED] ondansetron (ZOFRAN) IV, oxyCODONE, polyethylene glycol, zolpidem  Micro Results Recent Results (from the past 240 hour(s))  Blood culture (routine x 2)     Status: None (Preliminary result)   Collection Time: 06/10/16 11:33 AM  Result Value Ref Range Status   Specimen Description BLOOD RIGHT ANTECUBITAL  Final   Special Requests BOTTLES DRAWN AEROBIC AND ANAEROBIC  5CC  Final   Culture NO GROWTH 3 DAYS  Final   Report Status PENDING  Incomplete  Blood culture (routine x 2)     Status: None (Preliminary result)   Collection Time: 06/10/16 12:00 PM  Result Value Ref Range Status   Specimen Description BLOOD RIGHT HAND  Final   Special Requests BOTTLES DRAWN AEROBIC AND ANAEROBIC  5CC  Final   Culture NO GROWTH 3 DAYS  Final   Report Status PENDING  Incomplete  Surgical pcr screen     Status: None   Collection Time: 06/11/16  7:47 PM  Result Value Ref Range Status   MRSA, PCR NEGATIVE NEGATIVE Final   Staphylococcus aureus NEGATIVE NEGATIVE Final    Comment:  The Xpert SA Assay (FDA approved for NASAL specimens in patients over 89 years of age), is one component of a comprehensive surveillance program.  Test performance has been validated by [facility] for patients greater than or equal to 36 year old. It is not intended to diagnose infection nor to guide or monitor treatment.     Radiology Reports Dg Chest Port 1 View  Result Date: 06/10/2016 CLINICAL DATA:  Shortness of breath.  History of osteomyelitis EXAM: PORTABLE CHEST 1 VIEW COMPARISON:  January 20, 2014 FINDINGS: There is no edema or consolidation. Heart is upper normal in size with pulmonary vascularity within normal limits. There is atherosclerotic calcification in the aortic arch. No bone lesions. IMPRESSION: Aortic atherosclerosis.  No edema or consolidation. Electronically Signed   By: Lowella Grip III M.D.   On: 06/10/2016 14:02     Amaru Burroughs M.D on 06/13/2016 at 1:22  PM  Between 7am to 7pm - Pager - [phone]  After 7pm go to [email or web address] - password [facility]  [facility] -  Office  [phone]

## 2016-06-13 NOTE — Progress Notes (Signed)
Patient ID: [name] Small, male   DOB: 1928/03/02, 82 y.o.   MRN: [id number] Postoperative day 1 left transmetatarsal amputation. Patient did have ischemic muscle changes consistent with chronic lower extremity ischemia. The portable wound VAC is functioning well. Patient may be discharged to home once he is safe with ambulation he will need home health physical therapy. Orders are written for him to continue a nitroglycerin patch to the dorsum of the ankle change daily and I will follow-up in 1 week to change the wound VAC in the office.

## 2016-06-13 NOTE — Evaluation (Signed)
Physical Therapy Evaluation Patient Details Name: [name] Small MRN: [id number] DOB: 02-10-1928 Today's Date: 06/13/2016   History of Present Illness  Patient is an 82 yo male admitted 06/10/16 with osteomyelitis Lt foot, now s/p Lt transmetatarsal amputation.   PMH:  CAD, CHF, DM, PVD, h/o Lt 1st ray amputation, COPD, HTN, CKD  Clinical Impression  Patient presents with problems listed below.  Will benefit from acute PT to maximize functional independence prior to discharge home with wife.  Patient and wife report they are going home at d/c.  Recommend HHPT for continued therapy.  Will try kneeling walker and stairs at next session.    Follow Up Recommendations Home health PT;Supervision/Assistance - 24 hour    Equipment Recommendations  Other (comment) (Patient asking about kneeling walker.  Will try next session)    Recommendations for Other Services       Precautions / Restrictions Precautions Precautions: Fall Precaution Comments: VAC on Lt foot Required Braces or Orthoses: Other Brace/Splint Other Brace/Splint: post-op shoe Restrictions Weight Bearing Restrictions: Yes LLE Weight Bearing: Non weight bearing      Mobility  Bed Mobility Overal bed mobility: Needs Assistance Bed Mobility: Supine to Sit     Supine to sit: Supervision     General bed mobility comments: Supervision for safety only.  Transfers Overall transfer level: Needs assistance Equipment used: Rolling walker (2 wheeled) Transfers: Sit to/from Stand Sit to Stand: Min assist         General transfer comment: Verbal cues for hand placement and NWB on LLE.  Assist to rise to standing from bed and toilet.  Assist to steady.  Ambulation/Gait Ambulation/Gait assistance: Min assist Ambulation Distance (Feet): 22 Feet Assistive device: Rolling walker (2 wheeled) Gait Pattern/deviations: Step-to pattern (Hop-to on RLE) Gait velocity: decreased Gait velocity interpretation: Below normal speed for  age/gender General Gait Details: Verbal cues for safe use of RW and sequencing with NWB on LLE.  Needed repeated cueing to move at a slower, more safe pace, and to maintain NWB LLE.   Stairs            Wheelchair Mobility    Modified Rankin (Stroke Patients Only)       Balance Overall balance assessment: Needs assistance         Standing balance support: Single extremity supported Standing balance-Leahy Scale: Poor Standing balance comment: UE support to maintain balance with NWB LLE                             Pertinent Vitals/Pain Pain Assessment: 0-10 Pain Score: 2  Pain Location: Lt foot Pain Descriptors / Indicators: Sore Pain Intervention(s): Monitored during session;Repositioned    Home Living Family/patient expects to be discharged to:: Private residence Living Arrangements: Spouse/significant other Available Help at Discharge: Family;Available 24 hours/day Type of Home: House Home Access: Stairs to enter Entrance Stairs-Rails: None Entrance Stairs-Number of Steps: 3 Home Layout: One level Home Equipment: Walker - 2 wheels;Cane - single point;Bedside commode;Wheelchair - manual      Prior Function Level of Independence: Independent               Hand Dominance        Extremity/Trunk Assessment   Upper Extremity Assessment: Overall WFL for tasks assessed           Lower Extremity Assessment: LLE deficits/detail   LLE Deficits / Details: Able to move LLE against gravity.  VAC on Lt foot.  Communication   Communication: No difficulties  Cognition Arousal/Alertness: Awake/alert Behavior During Therapy: WFL for tasks assessed/performed;Anxious Overall Cognitive Status: Within Functional Limits for tasks assessed                      General Comments      Exercises        Assessment/Plan    PT Assessment Patient needs continued PT services  PT Diagnosis Difficulty walking;Acute pain   PT Problem  List Decreased strength;Decreased activity tolerance;Decreased balance;Decreased mobility;Decreased knowledge of use of DME;Decreased knowledge of precautions;Pain;Decreased skin integrity  PT Treatment Interventions DME instruction;Gait training;Stair training;Functional mobility training;Therapeutic activities;Patient/family education   PT Goals (Current goals can be found in the Care Plan section) Acute Rehab PT Goals Patient Stated Goal: To go home PT Goal Formulation: With patient/family Time For Goal Achievement: 06/20/16 Potential to Achieve Goals: Good    Frequency Min 3X/week   Barriers to discharge Inaccessible home environment Patient with 3 stairs without rails at entrance    Co-evaluation               End of Session Equipment Utilized During Treatment: Gait belt Activity Tolerance: Patient tolerated treatment well Patient left: in chair;with call bell/phone within reach;with family/visitor present Nurse Communication: Mobility status;Weight bearing status (Patient in chair)         Time: [id number] PT Time Calculation (min) (ACUTE ONLY): 30 min   Charges:   PT Evaluation $PT Eval Moderate Complexity: 1 Procedure PT Treatments $Gait Training: 8-22 mins   PT G CodesDespina Pole 2016-07-02, 4:03 PM Carita Pian. Sanjuana Kava, [place] Pager [phone]

## 2016-06-13 NOTE — Care Management Note (Addendum)
Case Management Note  Patient Details  Name: HAGEN BOHORQUEZ MRN: [id number] Date of Birth: [birth year]  Subjective/Objective:       CM following for progression and d/c planning.              Action/Plan: 06/13/2016 Met with pt and wife re HH needs. They selected [facility] for HHPT. [place] notified. Pt has DME, questions re scooter to elevate leg this CM recommended talking with PT at time of PT eval.   Expected Discharge Date:                  Expected Discharge Plan:  [place]  In-House Referral:  NA  Discharge planning Services  CM Consult  Post Acute Care Choice:  Home Health Choice offered to:  Patient  DME Arranged:   No needs , has walker, canes and 3:1.  DME Agency:     HH Arranged:  PT, RN [place] Agency:  [place]  Status of Service:  Completed, signed off  If discussed at [place] of Stay Meetings, dates discussed:    Additional Comments:  Adron Bene, RN 06/13/2016, 10:59 AM

## 2016-06-13 NOTE — Care Management Important Message (Signed)
Important Message  Patient Details  Name: [name] Small MRN: [id number] Date of Birth: January 27, 1928   Medicare Important Message Given:  Yes    Loann Quill 06/13/2016, 11:00 AM

## 2016-06-14 ENCOUNTER — Encounter: Payer: Medicare Other | Admitting: Podiatry

## 2016-06-14 ENCOUNTER — Telehealth: Payer: Self-pay

## 2016-06-14 DIAGNOSIS — Z89439 Acquired absence of unspecified foot: Secondary | ICD-10-CM

## 2016-06-14 LAB — BASIC METABOLIC PANEL
Anion gap: 6 (ref 5–15)
BUN: 23 mg/dL — ABNORMAL HIGH (ref 6–20)
CO2: 29 mmol/L (ref 22–32)
Calcium: 8.1 mg/dL — ABNORMAL LOW (ref 8.9–10.3)
Chloride: 100 mmol/L — ABNORMAL LOW (ref 101–111)
Creatinine, Ser: 2.08 mg/dL — ABNORMAL HIGH (ref 0.61–1.24)
GFR calc Af Amer: 32 mL/min — ABNORMAL LOW (ref >60)
GFR calc non Af Amer: 27 mL/min — ABNORMAL LOW (ref >60)
Glucose, Bld: 184 mg/dL — ABNORMAL HIGH (ref 65–99)
Potassium: 4.9 mmol/L (ref 3.5–5.1)
Sodium: 135 mmol/L (ref 135–145)

## 2016-06-14 LAB — GLUCOSE, CAPILLARY
Glucose-Capillary: 177 mg/dL — ABNORMAL HIGH (ref 65–99)
Glucose-Capillary: 284 mg/dL — ABNORMAL HIGH (ref 65–99)

## 2016-06-14 MED ORDER — MAGNESIUM CITRATE PO SOLN
1.0000 | Freq: Once | ORAL | Status: AC
  Administered 2016-06-14: 1 via ORAL
  Filled 2016-06-14: qty 296

## 2016-06-14 MED ORDER — OXYCODONE HCL 5 MG PO TABS: 5.0000 mg | ORAL_TABLET | Freq: Four times a day (QID) | ORAL | 0 refills | Status: DC | PRN

## 2016-06-14 MED ORDER — BISACODYL 10 MG RE SUPP
10.0000 mg | Freq: Once | RECTAL | Status: AC
  Administered 2016-06-14: 10 mg via RECTAL
  Filled 2016-06-14: qty 1

## 2016-06-14 NOTE — Progress Notes (Signed)
Discharge instructions and medications discussed with patient & wife.  Prescription given to wife.  All questions answered.

## 2016-06-14 NOTE — Telephone Encounter (Signed)
Wife requested a [place] aide to come in 2-3 days per week to assist with baths and other ADLs.   [facility]atha at [facility] who stated he will qualify because he will have skilled nursing and PT services.   Order placed for [facility] aide. Please fax the order to [place] @ [phone] ATTN: Referral Support.   Thanks.

## 2016-06-14 NOTE — Discharge Instructions (Signed)
Follow with Primary MD [name], MD in 7 days   Get CBC, CMP,  checked  by Primary MD next visit.    Activity: As tolerated with Full fall precautions use walker/cane & assistance as needed, LLE NWB  Disposition Home    Diet: Heart Healthy  , with feeding assistance and aspiration precautions.  For Heart failure patients - Check your Weight same time everyday, if you gain over 2 pounds, or you develop in leg swelling, experience more shortness of breath or chest pain, call your Primary MD immediately. Follow Cardiac Low Salt Diet and 1.5 lit/day fluid restriction.   On your next visit with your primary care physician please Get Medicines reviewed and adjusted.   Please request your Prim.MD to go over all Hospital Tests and Procedure/Radiological results at the follow up, please get all Hospital records sent to your Prim MD by signing hospital release before you go home.   If you experience worsening of your admission symptoms, develop shortness of breath, life threatening emergency, suicidal or homicidal thoughts you must seek medical attention immediately by calling 911 or calling your MD immediately  if symptoms less severe.  You Must read complete instructions/literature along with all the possible adverse reactions/side effects for all the Medicines you take and that have been prescribed to you. Take any new Medicines after you have completely understood and accpet all the possible adverse reactions/side effects.   Do not drive, operating heavy machinery, perform activities at heights, swimming or participation in water activities or provide baby sitting services if your were admitted for syncope or siezures until you have seen by Primary MD or a Neurologist and advised to do so again.  Do not drive when taking Pain medications.    Do not take more than prescribed Pain, Sleep and Anxiety Medications  Special Instructions: If you have smoked or chewed Tobacco  in the last 2  yrs please stop smoking, stop any regular Alcohol  and or any Recreational drug use.  Wear Seat belts while driving.   Please note  You were cared for by a hospitalist during your hospital stay. If you have any questions about your discharge medications or the care you received while you were in the hospital after you are discharged, you can call the unit and asked to speak with the hospitalist on call if the hospitalist that took care of you is not available. Once you are discharged, your primary care physician will handle any further medical issues. Please note that NO REFILLS for any discharge medications will be authorized once you are discharged, as it is imperative that you return to your primary care physician (or establish a relationship with a primary care physician if you do not have one) for your aftercare needs so that they can reassess your need for medications and monitor your lab values.

## 2016-06-14 NOTE — Telephone Encounter (Signed)
Transition Care Management Follow-up Telephone Call     Date discharged? 06/14/2016  How have you been since you were released from the hospital? Per pt's spouse he is recovering from recent surgery. Wound vac is intact. Oxygen is being used PRN. Oxygen sats have been in high 90s.    Do you understand why you were in the hospital? Yes   Do you understand the discharge instructions? Yes   Where were you discharged to? Home   Items Reviewed:  Medications reviewed: Yes  Allergies reviewed: Yes  Dietary changes reviewed: Yes  Referrals reviewed: Yes   Functional Questionnaire:    Activities of Daily Living (ADLs):  Pt is independent with ADLs.   Personal hygiene  Dressing Eating  Maintaining continence Transferring - uses walker and bedside commode  Independent Activities of Daily Living (ADLs): Pt is independent with basic communication skills but is dependent with the remaining iADLs.  Basic Corporate treasurer Housework  Managing medications Managing personal finances   Confirmed importance and date/time of follow-up visits scheduled YES  Provider Appointment booked with PCP 06/21/16  Confirmed with patient if condition begins to worsen call PCP or go to the ER.  Patient was given the office number and encouraged to call back with question or concerns: YES

## 2016-06-14 NOTE — Evaluation (Signed)
Occupational Therapy Evaluation Patient Details Name: [name] Small MRN: [id number] DOB: Apr 16, 1928 Today's Date: 06/14/2016    History of Present Illness Patient is an 82 yo male admitted 06/10/16 with osteomyelitis Lt foot, now s/p Lt transmetatarsal amputation.   PMH:  CAD, CHF, DM, PVD, h/o Lt 1st ray amputation, COPD, HTN, CKD   Clinical Impression   Pt is at min A level with LB ADLs and ADL mobility. Pt will have 24 hour assist at home form his wife. Pt has all necessary DME and A/E at home. All educated completed and no further acute OT indicated    Follow Up Recommendations  No OT follow up;Supervision/Assistance - 24 hour    Equipment Recommendations  None recommended by OT    Recommendations for Other Services       Precautions / Restrictions Precautions Precautions: Fall Precaution Comments: VAC on Lt foot Required Braces or Orthoses: Other Brace/Splint Other Brace/Splint: post-op shoe Restrictions Weight Bearing Restrictions: Yes LLE Weight Bearing: Non weight bearing      Mobility Bed Mobility Overal bed mobility: Needs Assistance Bed Mobility: Supine to Sit     Supine to sit: Supervision     General bed mobility comments: pt sitting EOB  Transfers Overall transfer level: Needs assistance Equipment used: Rolling walker (2 wheeled) Transfers: Sit to/from Stand Sit to Stand: Min assist         General transfer comment: Verbal cues for hand placement and NWB on LLE.  Assist to rise to standing from bed and toilet.  Assist to steady.    Balance Overall balance assessment: Needs assistance   Sitting balance-Leahy Scale: Good     Standing balance support: Bilateral upper extremity supported Standing balance-Leahy Scale: Poor                              ADL Overall ADL's : Needs assistance/impaired     Grooming: Wash/dry hands;Wash/dry face;Min guard;Standing   Upper Body Bathing: Set up;Sitting   Lower Body Bathing:  Minimal assistance;Cueing for safety   Upper Body Dressing : Sitting   Lower Body Dressing: Minimal assistance;Cueing for safety   Toilet Transfer: Minimal assistance;Grab bars;Regular Toilet;RW;Cueing for safety   Toileting- Clothing Manipulation and Hygiene: Min guard;Sit to/from stand;Cueing for safety   Tub/ Shower Transfer: Minimal assistance;3 in 1;Cueing for safety   Functional mobility during ADLs: Minimal assistance;Cueing for safety;Rolling walker General ADL Comments: Pt declined donning pants and wanted to stay in shorts until time to go home. educated pt and wife on LB dresing techniques with wound vac     Vision  no change from baseline              Pertinent Vitals/Pain Pain Assessment: 0-10 Pain Score: 2  Pain Location: L foot Pain Descriptors / Indicators: Sore Pain Intervention(s): Monitored during session     Hand Dominance Left   Extremity/Trunk Assessment Upper Extremity Assessment Upper Extremity Assessment: Overall WFL for tasks assessed   Lower Extremity Assessment Lower Extremity Assessment: Defer to PT evaluation       Communication Communication Communication: No difficulties   Cognition Arousal/Alertness: Awake/alert Behavior During Therapy: WFL for tasks assessed/performed;Impulsive Overall Cognitive Status: Within Functional Limits for tasks assessed                     General Comments   pt pleasant, anxious, impatient and decreased safety awareness  Home Living Family/patient expects to be discharged to:: Private residence Living Arrangements: Spouse/significant other Available Help at Discharge: Family;Available 24 hours/day Type of Home: House Home Access: Stairs to enter [employer] of Steps: 3 Entrance Stairs-Rails: None Home Layout: One level     Bathroom Shower/Tub: Tub/shower unit;Walk-in shower   Bathroom Toilet: Standard     Home Equipment: Environmental consultant - 2 wheels;Cane -  single point;Bedside commode;Wheelchair - manual          Prior Functioning/Environment Level of Independence: Independent        Comments: uses cane and furniture walks, daughters reports several incidence of LEs giving out and patient falls    OT Diagnosis: Acute pain   OT Problem List: Decreased activity tolerance;Pain   OT Treatment/Interventions:      OT Goals(Current goals can be found in the care plan section) Acute Rehab OT Goals Patient Stated Goal: To go home  OT Frequency:     Barriers to D/C:  none                        End of Session Equipment Utilized During Treatment: Rolling walker  Activity Tolerance: Patient tolerated treatment well Patient left: in bed;with call bell/phone within reach;with bed alarm set;with family/visitor present (sitting EOB)   Time: [id number] OT Time Calculation (min): 15 min Charges:  OT General Charges $OT Visit: 1 Procedure OT Evaluation $OT Eval Moderate Complexity: 1 Procedure G-Codes:    Britt Bottom 06/14/2016, 2:00 PM

## 2016-06-14 NOTE — Progress Notes (Signed)
SATURATION QUALIFICATIONS: (This note is used to comply with regulatory documentation for home oxygen)  Patient Saturations on Room Air at Rest = 80%  Patient Saturations on Room Air while Ambulating = 85%  Patient Saturations on 2 Liters of oxygen while Ambulating = 96%  Please briefly explain why patient needs home oxygen:

## 2016-06-14 NOTE — Addendum Note (Signed)
Addended by: Tonia Ghent on: 06/14/2016 04:59 PM   Modules accepted: Orders

## 2016-06-14 NOTE — Progress Notes (Signed)
Physical Therapy Treatment Patient Details Name: TAYLER LEBRECHT MRN: [id number] DOB: 06/04/1928 Today's Date: 06/14/2016    History of Present Illness Patient is an 82 yo male admitted 06/10/16 with osteomyelitis Lt foot, now s/p Lt transmetatarsal amputation.   PMH:  CAD, CHF, DM, PVD, h/o Lt 1st ray amputation, COPD, HTN, CKD    PT Comments    Patient making progress toward goals.  Continued to educate patient on importance of moving slowly for safety, NWB on LLE.  Instructed patient and wife on negotiating stairs with w/c to get patient into house.  Daughter and son-in-law to assist.  Patient to have f/u HHPT at d/c for continued therapy for mobility and to work on stair negotiation.   Follow Up Recommendations  Home health PT;Supervision/Assistance - 24 hour     Equipment Recommendations  None recommended by PT (Demonstrated kneeling walker - patient hesitant to use)    Recommendations for Other Services       Precautions / Restrictions Precautions Precautions: Fall Precaution Comments: VAC on Lt foot Required Braces or Orthoses: Other Brace/Splint Other Brace/Splint: post-op shoe Restrictions Weight Bearing Restrictions: Yes LLE Weight Bearing: Non weight bearing    Mobility  Bed Mobility Overal bed mobility: Needs Assistance Bed Mobility: Supine to Sit     Supine to sit: Supervision     General bed mobility comments: Supervision for safety only.  Transfers Overall transfer level: Needs assistance Equipment used: Rolling walker (2 wheeled) Transfers: Sit to/from Stand Sit to Stand: Min assist         General transfer comment: Verbal cues for hand placement and NWB on LLE.  Assist to rise to standing from bed and toilet.  Assist to steady.  Ambulation/Gait Ambulation/Gait assistance: Min assist Ambulation Distance (Feet): 44 Feet ( x 2 with rest break) Assistive device: Rolling walker (2 wheeled) Gait Pattern/deviations: Step-to pattern;Decreased weight  shift to left;Antalgic;Trunk flexed     General Gait Details: Verbal cues for safe use of RW, for NWB on LLE, and to move more slowly for safety.  Patient unable to maintain NWB on LLE.  Was doing TDWB on Lt heel.  Needs repeated cues to slow down for safety.  Patient ambulated to bathroom on room air, with O2 sats dropping to 85%.  Reapplied O2 at 2 l/min and O2 sat returned to 95% in 30 seconds.  Patient then ambulated 22' on O2 with sats remaining 95-96%.   Stairs Stairs: Yes       General stair comments: Patient unable to maintain NWB LLE on level floor.  Would not be able to hop up steps backward safely.  Discussed with patient and wife.  Provided handout and verbally instructed on negotiating stairs using wheelchair.  Patient's daughter and son-in-law will be present to get patient into house.  Reviewed 2nd time with wife how to have the son-in-law and daughter maneuver w/c on stairs.  Wife without questions.  Patient states he can do stairs.  Repeated instructions not to attempt stairs, but to use w/c.  HHPT can work with patient to negotiate stairs once home.  Wheelchair Mobility    Modified Rankin (Stroke Patients Only)       Balance           Standing balance support: Bilateral upper extremity supported Standing balance-Leahy Scale: Poor                      Cognition Arousal/Alertness: Awake/alert Behavior During Therapy: WFL for tasks assessed/performed;Impulsive  Overall Cognitive Status: Within Functional Limits for tasks assessed                      Exercises      General Comments General comments (skin integrity, edema, etc.): At end of session, patient attempting to don pants.  Difficulty with VAC.  Instructed patient and wife donning pants with VAC.      Pertinent Vitals/Pain Pain Assessment: 0-10 Pain Score: 2  Pain Location: Lt foot Pain Descriptors / Indicators: Sore Pain Intervention(s): Monitored during session    Home Living                       Prior Function            PT Goals (current goals can now be found in the care plan section) Acute Rehab PT Goals Patient Stated Goal: To go home Progress towards PT goals: Progressing toward goals    Frequency  Min 3X/week    PT Plan Current plan remains appropriate    Co-evaluation             End of Session Equipment Utilized During Treatment: Gait belt;Oxygen Activity Tolerance: Patient tolerated treatment well Patient left: in bed;with call bell/phone within reach;with family/visitor present;with bed alarm set (sitting EOB)     Time: [id number] PT Time Calculation (min) (ACUTE ONLY): 30 min + 10 min = 40 min total  Charges:  $Gait Training: 23-37 mins $Therapeutic Activity: 8-22 mins                    G Codes:      Despina Pole Jul 05, 2016, 11:04 AM Carita Pian. Sanjuana Kava, [place] Pager [phone]

## 2016-06-14 NOTE — Anesthesia Postprocedure Evaluation (Signed)
Anesthesia Post Note  Patient: VINIT GILLS  Procedure(s) Performed: Procedure(s) (LRB): TRANSMETATARSAL AMPUTATION LEFT FOOT (Left)  Patient location during evaluation: PACU Anesthesia Type: General Level of consciousness: awake and alert Pain management: pain level controlled Vital Signs Assessment: post-procedure vital signs reviewed and stable Respiratory status: spontaneous breathing, nonlabored ventilation, respiratory function stable and patient connected to nasal cannula oxygen Cardiovascular status: blood pressure returned to baseline and stable Postop Assessment: no signs of nausea or vomiting Anesthetic complications: no    Last Vitals:  Vitals:   06/14/16 0537 06/14/16 0820  BP: 126/64 (!) 123/56  Pulse: 68 73  Resp: 16 18  Temp: 36.4 C 36.8 C    Last Pain:  Vitals:   06/14/16 0820  TempSrc: Oral  PainSc:                  Lenell Mcconnell,JAMES TERRILL

## 2016-06-14 NOTE — Discharge Summary (Signed)
[name] Small, is a 82 y.o. male  DOB [birth year]  MRN [id number].  Admission date:  06/10/2016  Admitting Physician  Waldemar Dickens, MD  Discharge Date:  06/14/2016   Primary MD  [name], MD  Recommendations for primary care physician for things to follow:  - Please check CBC, BMP during next visit - patient to follow with Dr. Sharol Given next week   Admission Diagnosis  Osteomyelitis ([place]) [M86.9] Osteomyelitis of left foot, unspecified chronicity ([place]) [M86.9]   Discharge Diagnosis  Osteomyelitis ([place]) [M86.9] Osteomyelitis of left foot, unspecified chronicity ([place]) [M86.9]   Principal Problem:   Osteomyelitis ([place]) Active Problems:   Diabetes with neurologic complications ([place])   Chronic kidney disease, stage III (moderate)   BPH (benign prostatic hyperplasia)   CAD (coronary artery disease) of artery bypass graft   Bladder cancer (HCC)   Atrial fibrillation ([place])   HTN (hypertension)   Cellulitis   History of COPD   Osteomyelitis of left foot ([place])   Acute renal failure superimposed on stage 3 chronic kidney disease ([place])      Past Medical History:  Diagnosis Date  . Arthritis   . Back pain    s/p lumbar injection 2014  . BENIGN PROSTATIC HYPERTROPHY 05/21/2007  . Bladder cancer ([place]) 10/2011  . CAD (coronary artery disease) CARDIOLOGIST- DR WALL - VISIT IN JUNE 2012 FOR CATH   nonobstructive by cath 6/12:  mid LAD 30%, proximal obtuse marginal-2 30%, proximal RCA 20%, mid RCA 30-40%.  He had normal cardiac output and mildly elevated filling pressures but no significant pulmonary hypertension;   Echocardiogram in May 2012 demonstrated EF 50-55% and left atrial enlargement   . Cellulitis of left foot    Hospitalized in 2006  . CHF (congestive heart failure) ([place])    per family, issues in 2012  . Chronic kidney disease (CKD), stage III (moderate) 12/04/2007   FOLLOWED BY DR PATEL    . Chronic pain of lower extremity   . DIABETES MELLITUS, TYPE II 05/21/2007  . DIVERTICULOSIS, COLON 05/21/2007   pt denies  . Fatigue AGE-RELATED  . Gross hematuria    pt denies  . HYPERTENSION 05/21/2007  . Impaired hearing BILATERAL HEARING AIDS  . On home oxygen therapy    "2L at nighttime" (02/28/2016)  . PAD (peripheral artery disease) ([place])   . Psoriasis ELBOWS  . PSORIASIS, SCALP 10/25/2008    Past Surgical History:  Procedure Laterality Date  . AMPUTATION Left 03/27/2016   Procedure: partial first AMPUTATION RAY; LEFT;  Surgeon: Trula Slade, DPM;  Location: [facility];  Service: Podiatry;  Laterality: Left;  . AMPUTATION Left 06/12/2016   Procedure: TRANSMETATARSAL AMPUTATION LEFT FOOT;  Surgeon: Newt Minion, MD;  Location: [facility];  Service: Orthopedics;  Laterality: Left;  . APPENDECTOMY  1941  . CARDIAC CATHETERIZATION  04-24-11/  DR Rogue Jury ARIDA   MILD NONOBSTRUCTIVE CAD, NORMA CARDIAC OUTPUT  . CARDIOVASCULAR STRESS TEST  2007  . CATARACT EXTRACTION W/ INTRAOCULAR LENS  IMPLANT, BILATERAL Bilateral ~  2010  . CYSTOSCOPY  12/25/2011   Procedure: CYSTOSCOPY;  Surgeon: Molli Hazard, MD;  Location: [facility];  Service: Urology;  Laterality: N/A;  needs intubation and to be paralyzed   . CYSTOSCOPY W/ RETROGRADES  10/30/2011   Procedure: CYSTOSCOPY WITH RETROGRADE PYELOGRAM;  Surgeon: Molli Hazard, MD;  Location: [facility];  Service: Urology;  Laterality: Bilateral;  CYSTOSCOPY POSS TURBT BILATERAL RETROGRADE PYLEOGRAM   . CYSTOSCOPY W/ RETROGRADES  11/30/2012   Procedure: CYSTOSCOPY WITH RETROGRADE PYELOGRAM;  Surgeon: Molli Hazard, MD;  Location: [facility];  Service: Urology;  Laterality: Bilateral;  Flexible cystoscopy.  . CYSTOSCOPY WITH BIOPSY  11/30/2012   Procedure: CYSTOSCOPY WITH BIOPSY;  Surgeon: Molli Hazard, MD;  Location: [facility];  Service: Urology;   Laterality: N/A;  . INCISION AND DRAINAGE FOOT Left ~ 2005   LEFT FOOT DUE TO INFECTION FROM  NAIL PUNCTURE INJURY  . PENILE PROSTHESIS IMPLANT  1990s  . PERIPHERAL VASCULAR CATHETERIZATION N/A 02/14/2016   Procedure: Abdominal Aortogram w/Lower Extremity;  Surgeon: Wellington Hampshire, MD;  Location: [place] [facility];  Service: Cardiovascular;  Laterality: N/A;  . PERIPHERAL VASCULAR CATHETERIZATION Left 02/28/2016   Procedure: Peripheral Vascular Balloon Angioplasty;  Surgeon: Wellington Hampshire, MD;  Location: [place] [facility];  Service: Cardiovascular;  Laterality: Left;  left peroneal and popliteal artery  . [place]  . TRANSURETHRAL RESECTION OF BLADDER TUMOR  10/30/2011   Procedure: TRANSURETHRAL RESECTION OF BLADDER TUMOR (TURBT);  Surgeon: Molli Hazard, MD;  Location: [facility];  Service: Urology;  Laterality: N/A;  . TRANSURETHRAL RESECTION OF BLADDER TUMOR  12/25/2011   Procedure: TRANSURETHRAL RESECTION OF BLADDER TUMOR (TURBT);  Surgeon: Molli Hazard, MD;  Location: [facility];  Service: Urology;  Laterality: N/A;  need long gyrus instruments   . VASECTOMY  1990s       History of present illness and  Hospital Course:     Kindly see H&P for history of present illness and admission details, please review complete Labs, Consult reports and Test reports for all details in brief  HPI  from the history and physical done on the day of admission 06/10/2016 HPI: [name] Small is a 82 y.o. male with medical history significant for diabetes type 2 chronic kidney disease, CAD, PVD, A. fib, CHF, bladder cancer came to the emergency department chief complaint worsening left second toe ulceration. Initial evaluation concerning for osteomylitiis.  Information is obtained from the patient and the chart. He states he was seen by podiatry 3 days ago and had the toe debrided. X-ray was done at that time which shows some  changes concerning for osseous. Chart review indicates cultures taken at that time he was discharged with clindamycin and Cipro. Weekend wife reports skin began to slough off and was oozing blood. He denies any headache visual disturbances fever chills nausea vomiting. He denies any abdominal pain diarrhea dysuria hematuria frequency or urgency. He denies any pain in his lower extremity secondary to diabetic neuropathy. He also reports blood sugars running higher than usual. Wife reports over the last 2 months he's had a decreased appetite and has lost approximately 13 pounds unintentionally. He denies any chest pain palpitation shortness of breath.    ED Course: In the emergency department he's afebrile hemodynamically stable and not toxic. He is provided with vancomycin and Zosyn.   Hospital Course   82 year old male  with history of coronary artery disease, CHF, diabetes mellitus, PVD, hypertension, BPH, history of bladder cancer, diabetic foot ulcer with osteomyelitis of left great toe status post partial first ray amputation in May this year, who presented with osteomyelitis of the left second toe with failed outpatient therapy. He was following with Dr. Jacqualyn Posey (podiatrist). He initially had a blister with a wound when seen in the office 3 days back on the second toe and was started on clindamycin and ciprofloxacin. Symptoms worsened and he was instructed to come to the ED. seen by Dr. Sharol Given, and went for a transmetatarsal left foot amputation on 8/2 by Dr. Sharol Given.  Diabetic Osteomyelitis Of left second toe (HCC) - Empiric vancomycin and Zosyn, stopped 24 hours after surgery. - Patient is followed as an outpatient by Dr. Jacqualyn Posey. - Blood cultures so far negative.  - Orthopedic consult obtained per family request for second opinion, Dr. Sharol Given was consulted ,status post transmetatarsal amputation 06/12/2016., Continue with wound VAC and Nitro patch on discharge, to follow with Dr. Sharol Given next  week.  Uncontrolled Diabetes mellitus with neurologic complications ([place]) - Continue with Lantus and insulin sliding scale, A1c is 9.3  - Continue Neurontin  Acute on  Chronic kidney disease, stage III (moderate) - Back to baseline on discharge, resume Lasix.  BPH (benign prostatic hyperplasia)  CAD (coronary artery disease) of artery bypass graft - Continue statin and Plavix  Peripheral vascular disease - Continue with Plavix   History of bladder cancer  status post chemotherapy  COPD Stable. Continue home inhalers   ** There was mention of A. fib on H&P this admission, as well  through previous medical records, reviewed previous EKGs, no evidence of atrial fibrillation  Discharge Condition:  Stable   Follow UP  Follow-up Information    DUDA,MARCUS V, MD Follow up in 1 week(s).   Specialty:  Orthopedic Surgery Contact information: [place] [address] [phone]        [name], MD Follow up in 1 week(s).   Specialty:  Family Medicine Contact information: [place] [address] [phone]             Discharge Instructions  and  Discharge Medications     Discharge Instructions    Discharge instructions    Complete by:  As directed   Follow with Primary MD [name], MD in 7 days   Get CBC, CMP,  checked  by Primary MD next visit.    Activity: As tolerated with Full fall precautions use walker/cane & assistance as needed, LLE NWB  Disposition Home    Diet: Heart Healthy, carbohydrate modified  , with feeding assistance and aspiration precautions.  For Heart failure patients - Check your Weight same time everyday, if you gain over 2 pounds, or you develop in leg swelling, experience more shortness of breath or chest pain, call your Primary MD immediately. Follow Cardiac Low Salt Diet and 1.5 lit/day fluid restriction.   On your next visit with your primary care physician  please Get Medicines reviewed and adjusted.   Please request your Prim.MD to go over all Hospital Tests and Procedure/Radiological results at the follow up, please get all Hospital records sent to your Prim MD by signing hospital release before you go home.   If you experience worsening of your admission symptoms, develop shortness of breath, life threatening emergency, suicidal or homicidal thoughts you must seek medical attention immediately by calling 911 or calling your MD  immediately  if symptoms less severe.  You Must read complete instructions/literature along with all the possible adverse reactions/side effects for all the Medicines you take and that have been prescribed to you. Take any new Medicines after you have completely understood and accpet all the possible adverse reactions/side effects.   Do not drive, operating heavy machinery, perform activities at heights, swimming or participation in water activities or provide baby sitting services if your were admitted for syncope or siezures until you have seen by Primary MD or a Neurologist and advised to do so again.  Do not drive when taking Pain medications.    Do not take more than prescribed Pain, Sleep and Anxiety Medications  Special Instructions: If you have smoked or chewed Tobacco  in the last 2 yrs please stop smoking, stop any regular Alcohol  and or any Recreational drug use.  Wear Seat belts while driving.   Please note  You were cared for by a hospitalist during your hospital stay. If you have any questions about your discharge medications or the care you received while you were in the hospital after you are discharged, you can call the unit and asked to speak with the hospitalist on call if the hospitalist that took care of you is not available. Once you are discharged, your primary care physician will handle any further medical issues. Please note that NO REFILLS for any discharge medications will be authorized once  you are discharged, as it is imperative that you return to your primary care physician (or establish a relationship with a primary care physician if you do not have one) for your aftercare needs so that they can reassess your need for medications and monitor your lab values.   Neg Press Wound Therapy / Incisional    Complete by:  As directed   Remove VAC dressing when VAC stops working, alarms   Non weight bearing    Complete by:  As directed   Laterality:  left   Extremity:  Lower       Medication List    STOP taking these medications   ciprofloxacin 500 MG tablet Commonly known as:  CIPRO   clindamycin 300 MG capsule Commonly known as:  CLEOCIN   collagenase ointment Commonly known as:  SANTYL     TAKE these medications   acetaminophen 500 MG tablet Commonly known as:  TYLENOL Take 1,000 mg by mouth every 8 (eight) hours as needed for mild pain, moderate pain or headache.   amLODipine 5 MG tablet Commonly known as:  NORVASC Take 1 tablet (5 mg total) by mouth daily.   atorvastatin 10 MG tablet Commonly known as:  LIPITOR Take 1 tablet (10 mg total) by mouth daily.   budesonide-formoterol 160-4.5 MCG/ACT inhaler Commonly known as:  SYMBICORT Inhale 2 puffs into the lungs 2 (two) times daily as needed (shortness of breath).   clobetasol 0.05 % external solution Commonly known as:  TEMOVATE Apply 1 application topically daily.   clopidogrel 75 MG tablet Commonly known as:  PLAVIX Take 1 tablet by mouth  daily   furosemide 40 MG tablet Commonly known as:  LASIX TAKE 1 TABLET BY MOUTH  EVERY MONDAY, WEDNESDAY,  AND FRIDAY   gabapentin 100 MG capsule Commonly known as:  NEURONTIN Take 100 mg by mouth 3 (three) times daily.   glipiZIDE 5 MG 24 hr tablet Commonly known as:  GLUCOTROL XL Take 1 tablet by mouth  daily with breakfast   insulin glargine 100 UNIT/ML  injection Commonly known as:  LANTUS Inject 0.1 mLs (10 Units total) into the skin daily.    ipratropium-albuterol 0.5-2.5 (3) MG/3ML Soln Commonly known as:  DUONEB Take 3 mLs by nebulization every 6 (six) hours as needed.   nitroGLYCERIN 0.2 mg/hr patch Commonly known as:  NITRODUR - Dosed in mg/24 hr Place 1 patch (0.2 mg total) onto the skin daily.   oxyCODONE 5 MG immediate release tablet Commonly known as:  Oxy IR/ROXICODONE Take 1 tablet (5 mg total) by mouth every 6 (six) hours as needed for severe pain or breakthrough pain.   polyethylene glycol packet Commonly known as:  MIRALAX / GLYCOLAX Take 17 g by mouth daily as needed.   tiotropium 18 MCG inhalation capsule Commonly known as:  SPIRIVA Place 18 mcg into inhaler and inhale daily.         Diet and Activity recommendation: See Discharge Instructions above   Consults obtained -  Podiatry Orthopedic Dr. Sharol Given  Major procedures and Radiology Reports - PLEASE review detailed and final reports for all details, in brief -   Transmetatarsal amputation left foot by Dr. Sharol Given 8/2   Dg Chest Port 1 View  Result Date: 06/10/2016 CLINICAL DATA:  Shortness of breath.  History of osteomyelitis EXAM: PORTABLE CHEST 1 VIEW COMPARISON:  January 20, 2014 FINDINGS: There is no edema or consolidation. Heart is upper normal in size with pulmonary vascularity within normal limits. There is atherosclerotic calcification in the aortic arch. No bone lesions. IMPRESSION: Aortic atherosclerosis.  No edema or consolidation. Electronically Signed   By: Lowella Grip III M.D.   On: 06/10/2016 14:02    Micro Results     Recent Results (from the past 240 hour(s))  Blood culture (routine x 2)     Status: None (Preliminary result)   Collection Time: 06/10/16 11:33 AM  Result Value Ref Range Status   Specimen Description BLOOD RIGHT ANTECUBITAL  Final   Special Requests BOTTLES DRAWN AEROBIC AND ANAEROBIC  5CC  Final   Culture NO GROWTH 3 DAYS  Final   Report Status PENDING  Incomplete  Blood culture (routine x 2)      Status: None (Preliminary result)   Collection Time: 06/10/16 12:00 PM  Result Value Ref Range Status   Specimen Description BLOOD RIGHT HAND  Final   Special Requests BOTTLES DRAWN AEROBIC AND ANAEROBIC  5CC  Final   Culture NO GROWTH 3 DAYS  Final   Report Status PENDING  Incomplete  Surgical pcr screen     Status: None   Collection Time: 06/11/16  7:47 PM  Result Value Ref Range Status   MRSA, PCR NEGATIVE NEGATIVE Final   Staphylococcus aureus NEGATIVE NEGATIVE Final    Comment:        The Xpert SA Assay (FDA approved for NASAL specimens in patients over 15 years of age), is one component of a comprehensive surveillance program.  Test performance has been validated by [facility] for patients greater than or equal to 65 year old. It is not intended to diagnose infection nor to guide or monitor treatment.        Today   Subjective:   [name] Small today has no headache,no chest abdominal pain,no new weakness tingling or numbness, feels much better wants to go home today.   Objective:   Blood pressure (!) 123/56, pulse 73, temperature 98.2 F (36.8 C), temperature source Oral, resp. rate 18, height 6\' 2"  (1.88 m), weight 98 kg (216 lb 0.8 oz),  SpO2 95 %.   Intake/Output Summary (Last 24 hours) at 06/14/16 1158 Last data filed at 06/14/16 1032  Gross per 24 hour  Intake          1681.67 ml  Output              250 ml  Net          1431.67 ml    Exam Gen: not in distress HEENT:  moist mucosa, supple neck Chest: clear b/l, no added sounds CVS: N S1&S2, no murmurs, rubs or gallop GI: soft, NT, ND, BS+ Musculoskeletal: Left transmetatarsal amputation with wound VAC on site. CNS: Alert and oriented  Data Review   CBC w Diff: Lab Results  Component Value Date   WBC 10.7 (H) 06/13/2016   HGB 11.5 (L) 06/13/2016   HCT 36.8 (L) 06/13/2016   HCT 41.5 02/23/2016   PLT 331 06/13/2016   PLT 273 02/23/2016   LYMPHOPCT 14 06/10/2016   MONOPCT 9 06/10/2016    EOSPCT 3 06/10/2016   BASOPCT 1 06/10/2016    CMP: Lab Results  Component Value Date   NA 135 06/14/2016   NA 145 (H) 02/23/2016   K 4.9 06/14/2016   CL 100 (L) 06/14/2016   CO2 29 06/14/2016   BUN 23 (H) 06/14/2016   BUN 34 (H) 02/23/2016   CREATININE 2.08 (H) 06/14/2016   CREATININE 1.48 (H) 03/04/2016   PROT 6.8 11/28/2015   ALBUMIN 4.1 11/28/2015   BILITOT 0.6 11/28/2015   ALKPHOS 47 11/28/2015   AST 19 11/28/2015   ALT 12 11/28/2015  .   Total Time in preparing paper work, data evaluation and todays exam - 35 minutes  Aide Wojnar M.D on 06/14/2016 at 11:58 AM  [facility]   Office  [phone]

## 2016-06-15 LAB — CULTURE, BLOOD (ROUTINE X 2)
Culture: NO GROWTH
Culture: NO GROWTH

## 2016-06-16 DIAGNOSIS — Z9981 Dependence on supplemental oxygen: Secondary | ICD-10-CM | POA: Diagnosis not present

## 2016-06-16 DIAGNOSIS — Z794 Long term (current) use of insulin: Secondary | ICD-10-CM | POA: Diagnosis not present

## 2016-06-16 DIAGNOSIS — E1122 Type 2 diabetes mellitus with diabetic chronic kidney disease: Secondary | ICD-10-CM | POA: Diagnosis not present

## 2016-06-16 DIAGNOSIS — Z7984 Long term (current) use of oral hypoglycemic drugs: Secondary | ICD-10-CM | POA: Diagnosis not present

## 2016-06-16 DIAGNOSIS — N400 Enlarged prostate without lower urinary tract symptoms: Secondary | ICD-10-CM | POA: Diagnosis not present

## 2016-06-16 DIAGNOSIS — N183 Chronic kidney disease, stage 3 (moderate): Secondary | ICD-10-CM | POA: Diagnosis not present

## 2016-06-16 DIAGNOSIS — Z7901 Long term (current) use of anticoagulants: Secondary | ICD-10-CM | POA: Diagnosis not present

## 2016-06-16 DIAGNOSIS — I2510 Atherosclerotic heart disease of native coronary artery without angina pectoris: Secondary | ICD-10-CM | POA: Diagnosis not present

## 2016-06-16 DIAGNOSIS — Z89432 Acquired absence of left foot: Secondary | ICD-10-CM | POA: Diagnosis not present

## 2016-06-16 DIAGNOSIS — E1151 Type 2 diabetes mellitus with diabetic peripheral angiopathy without gangrene: Secondary | ICD-10-CM | POA: Diagnosis not present

## 2016-06-16 DIAGNOSIS — I129 Hypertensive chronic kidney disease with stage 1 through stage 4 chronic kidney disease, or unspecified chronic kidney disease: Secondary | ICD-10-CM | POA: Diagnosis not present

## 2016-06-16 DIAGNOSIS — J449 Chronic obstructive pulmonary disease, unspecified: Secondary | ICD-10-CM | POA: Diagnosis not present

## 2016-06-16 DIAGNOSIS — I4891 Unspecified atrial fibrillation: Secondary | ICD-10-CM | POA: Diagnosis not present

## 2016-06-16 DIAGNOSIS — Z7951 Long term (current) use of inhaled steroids: Secondary | ICD-10-CM | POA: Diagnosis not present

## 2016-06-16 DIAGNOSIS — Z4781 Encounter for orthopedic aftercare following surgical amputation: Secondary | ICD-10-CM | POA: Diagnosis not present

## 2016-06-17 DIAGNOSIS — Z9981 Dependence on supplemental oxygen: Secondary | ICD-10-CM | POA: Diagnosis not present

## 2016-06-17 DIAGNOSIS — J449 Chronic obstructive pulmonary disease, unspecified: Secondary | ICD-10-CM | POA: Diagnosis not present

## 2016-06-17 DIAGNOSIS — I129 Hypertensive chronic kidney disease with stage 1 through stage 4 chronic kidney disease, or unspecified chronic kidney disease: Secondary | ICD-10-CM | POA: Diagnosis not present

## 2016-06-17 DIAGNOSIS — N400 Enlarged prostate without lower urinary tract symptoms: Secondary | ICD-10-CM | POA: Diagnosis not present

## 2016-06-17 DIAGNOSIS — Z794 Long term (current) use of insulin: Secondary | ICD-10-CM | POA: Diagnosis not present

## 2016-06-17 DIAGNOSIS — Z7951 Long term (current) use of inhaled steroids: Secondary | ICD-10-CM | POA: Diagnosis not present

## 2016-06-17 DIAGNOSIS — I4891 Unspecified atrial fibrillation: Secondary | ICD-10-CM | POA: Diagnosis not present

## 2016-06-17 DIAGNOSIS — Z7901 Long term (current) use of anticoagulants: Secondary | ICD-10-CM | POA: Diagnosis not present

## 2016-06-17 DIAGNOSIS — Z7984 Long term (current) use of oral hypoglycemic drugs: Secondary | ICD-10-CM | POA: Diagnosis not present

## 2016-06-17 DIAGNOSIS — Z4781 Encounter for orthopedic aftercare following surgical amputation: Secondary | ICD-10-CM | POA: Diagnosis not present

## 2016-06-17 DIAGNOSIS — N183 Chronic kidney disease, stage 3 (moderate): Secondary | ICD-10-CM | POA: Diagnosis not present

## 2016-06-17 DIAGNOSIS — I2510 Atherosclerotic heart disease of native coronary artery without angina pectoris: Secondary | ICD-10-CM | POA: Diagnosis not present

## 2016-06-17 DIAGNOSIS — Z89432 Acquired absence of left foot: Secondary | ICD-10-CM | POA: Diagnosis not present

## 2016-06-17 DIAGNOSIS — E1122 Type 2 diabetes mellitus with diabetic chronic kidney disease: Secondary | ICD-10-CM | POA: Diagnosis not present

## 2016-06-17 DIAGNOSIS — E1151 Type 2 diabetes mellitus with diabetic peripheral angiopathy without gangrene: Secondary | ICD-10-CM | POA: Diagnosis not present

## 2016-06-17 NOTE — Telephone Encounter (Signed)
Sent to [facility] for referral but order has already been placed per Dr. Damita Dunnings.

## 2016-06-17 NOTE — Telephone Encounter (Signed)
Sent [name] Small message making sure she received referral

## 2016-06-20 DIAGNOSIS — Z4781 Encounter for orthopedic aftercare following surgical amputation: Secondary | ICD-10-CM | POA: Diagnosis not present

## 2016-06-20 DIAGNOSIS — I129 Hypertensive chronic kidney disease with stage 1 through stage 4 chronic kidney disease, or unspecified chronic kidney disease: Secondary | ICD-10-CM | POA: Diagnosis not present

## 2016-06-20 DIAGNOSIS — I2510 Atherosclerotic heart disease of native coronary artery without angina pectoris: Secondary | ICD-10-CM | POA: Diagnosis not present

## 2016-06-20 DIAGNOSIS — Z7951 Long term (current) use of inhaled steroids: Secondary | ICD-10-CM | POA: Diagnosis not present

## 2016-06-20 DIAGNOSIS — Z794 Long term (current) use of insulin: Secondary | ICD-10-CM | POA: Diagnosis not present

## 2016-06-20 DIAGNOSIS — J449 Chronic obstructive pulmonary disease, unspecified: Secondary | ICD-10-CM | POA: Diagnosis not present

## 2016-06-20 DIAGNOSIS — N400 Enlarged prostate without lower urinary tract symptoms: Secondary | ICD-10-CM | POA: Diagnosis not present

## 2016-06-20 DIAGNOSIS — Z89432 Acquired absence of left foot: Secondary | ICD-10-CM | POA: Diagnosis not present

## 2016-06-20 DIAGNOSIS — E1151 Type 2 diabetes mellitus with diabetic peripheral angiopathy without gangrene: Secondary | ICD-10-CM | POA: Diagnosis not present

## 2016-06-20 DIAGNOSIS — Z7901 Long term (current) use of anticoagulants: Secondary | ICD-10-CM | POA: Diagnosis not present

## 2016-06-20 DIAGNOSIS — Z9981 Dependence on supplemental oxygen: Secondary | ICD-10-CM | POA: Diagnosis not present

## 2016-06-20 DIAGNOSIS — I4891 Unspecified atrial fibrillation: Secondary | ICD-10-CM | POA: Diagnosis not present

## 2016-06-20 DIAGNOSIS — N183 Chronic kidney disease, stage 3 (moderate): Secondary | ICD-10-CM | POA: Diagnosis not present

## 2016-06-20 DIAGNOSIS — Z7984 Long term (current) use of oral hypoglycemic drugs: Secondary | ICD-10-CM | POA: Diagnosis not present

## 2016-06-20 DIAGNOSIS — E1122 Type 2 diabetes mellitus with diabetic chronic kidney disease: Secondary | ICD-10-CM | POA: Diagnosis not present

## 2016-06-21 ENCOUNTER — Encounter: Payer: Self-pay | Admitting: Family Medicine

## 2016-06-21 ENCOUNTER — Ambulatory Visit (INDEPENDENT_AMBULATORY_CARE_PROVIDER_SITE_OTHER): Payer: Medicare Other | Admitting: Family Medicine

## 2016-06-21 VITALS — BP 112/60 | HR 60 | Wt 197.0 lb

## 2016-06-21 DIAGNOSIS — M869 Osteomyelitis, unspecified: Secondary | ICD-10-CM | POA: Diagnosis not present

## 2016-06-21 DIAGNOSIS — Z7901 Long term (current) use of anticoagulants: Secondary | ICD-10-CM | POA: Diagnosis not present

## 2016-06-21 DIAGNOSIS — N400 Enlarged prostate without lower urinary tract symptoms: Secondary | ICD-10-CM | POA: Diagnosis not present

## 2016-06-21 DIAGNOSIS — J449 Chronic obstructive pulmonary disease, unspecified: Secondary | ICD-10-CM | POA: Diagnosis not present

## 2016-06-21 DIAGNOSIS — Z8709 Personal history of other diseases of the respiratory system: Secondary | ICD-10-CM

## 2016-06-21 DIAGNOSIS — N183 Chronic kidney disease, stage 3 (moderate): Secondary | ICD-10-CM | POA: Diagnosis not present

## 2016-06-21 DIAGNOSIS — Z89432 Acquired absence of left foot: Secondary | ICD-10-CM | POA: Diagnosis not present

## 2016-06-21 DIAGNOSIS — I509 Heart failure, unspecified: Secondary | ICD-10-CM

## 2016-06-21 DIAGNOSIS — E1122 Type 2 diabetes mellitus with diabetic chronic kidney disease: Secondary | ICD-10-CM | POA: Diagnosis not present

## 2016-06-21 DIAGNOSIS — E1140 Type 2 diabetes mellitus with diabetic neuropathy, unspecified: Secondary | ICD-10-CM

## 2016-06-21 DIAGNOSIS — E1151 Type 2 diabetes mellitus with diabetic peripheral angiopathy without gangrene: Secondary | ICD-10-CM | POA: Diagnosis not present

## 2016-06-21 DIAGNOSIS — Z7951 Long term (current) use of inhaled steroids: Secondary | ICD-10-CM | POA: Diagnosis not present

## 2016-06-21 DIAGNOSIS — I4891 Unspecified atrial fibrillation: Secondary | ICD-10-CM | POA: Diagnosis not present

## 2016-06-21 DIAGNOSIS — Z7984 Long term (current) use of oral hypoglycemic drugs: Secondary | ICD-10-CM | POA: Diagnosis not present

## 2016-06-21 DIAGNOSIS — Z4781 Encounter for orthopedic aftercare following surgical amputation: Secondary | ICD-10-CM | POA: Diagnosis not present

## 2016-06-21 DIAGNOSIS — Z9981 Dependence on supplemental oxygen: Secondary | ICD-10-CM | POA: Diagnosis not present

## 2016-06-21 DIAGNOSIS — E119 Type 2 diabetes mellitus without complications: Secondary | ICD-10-CM | POA: Diagnosis not present

## 2016-06-21 DIAGNOSIS — Z794 Long term (current) use of insulin: Secondary | ICD-10-CM | POA: Diagnosis not present

## 2016-06-21 DIAGNOSIS — I129 Hypertensive chronic kidney disease with stage 1 through stage 4 chronic kidney disease, or unspecified chronic kidney disease: Secondary | ICD-10-CM | POA: Diagnosis not present

## 2016-06-21 DIAGNOSIS — I2510 Atherosclerotic heart disease of native coronary artery without angina pectoris: Secondary | ICD-10-CM | POA: Diagnosis not present

## 2016-06-21 LAB — BASIC METABOLIC PANEL
BUN: 42 mg/dL — ABNORMAL HIGH (ref 6–23)
CO2: 31 mEq/L (ref 19–32)
Calcium: 9 mg/dL (ref 8.4–10.5)
Chloride: 99 mEq/L (ref 96–112)
Creatinine, Ser: 2.14 mg/dL — ABNORMAL HIGH (ref 0.40–1.50)
GFR: 31.33 mL/min — ABNORMAL LOW (ref >60.00)
Glucose, Bld: 189 mg/dL — ABNORMAL HIGH (ref 70–99)
Potassium: 4.9 mEq/L (ref 3.5–5.1)
Sodium: 137 mEq/L (ref 135–145)

## 2016-06-21 LAB — CBC WITH DIFFERENTIAL/PLATELET
Basophils Absolute: 0 10*3/uL (ref 0.0–0.1)
Basophils Relative: 0.4 % (ref 0.0–3.0)
Eosinophils Absolute: 0.2 10*3/uL (ref 0.0–0.7)
Eosinophils Relative: 2.2 % (ref 0.0–5.0)
HCT: 39.8 % (ref 39.0–52.0)
Hemoglobin: 13.2 g/dL (ref 13.0–17.0)
Lymphocytes Relative: 18.5 % (ref 12.0–46.0)
Lymphs Abs: 1.7 10*3/uL (ref 0.7–4.0)
MCHC: 33.2 g/dL (ref 30.0–36.0)
MCV: 95.6 fl (ref 78.0–100.0)
Monocytes Absolute: 0.7 10*3/uL (ref 0.1–1.0)
Monocytes Relative: 7.7 % (ref 3.0–12.0)
Neutro Abs: 6.6 10*3/uL (ref 1.4–7.7)
Neutrophils Relative %: 71.2 % (ref 43.0–77.0)
Platelets: 354 10*3/uL (ref 150.0–400.0)
RBC: 4.17 Mil/uL — ABNORMAL LOW (ref 4.22–5.81)
RDW: 13.8 % (ref 11.5–15.5)
WBC: 9.3 10*3/uL (ref 4.0–10.5)

## 2016-06-21 MED ORDER — TIOTROPIUM BROMIDE MONOHYDRATE 18 MCG IN CAPS: 18.0000 ug | ORAL_CAPSULE | Freq: Every day | RESPIRATORY_TRACT | 3 refills | Status: DC

## 2016-06-21 MED ORDER — ALBUTEROL SULFATE (2.5 MG/3ML) 0.083% IN NEBU: 2.5000 mg | INHALATION_SOLUTION | Freq: Four times a day (QID) | RESPIRATORY_TRACT | 2 refills | Status: DC | PRN

## 2016-06-21 MED ORDER — INSULIN GLARGINE 100 UNIT/ML ~~LOC~~ SOLN: 15.0000 [IU] | Freq: Every day | SUBCUTANEOUS | Status: DC

## 2016-06-21 NOTE — Progress Notes (Addendum)
Admission date:  06/10/2016  Admitting Physician  Waldemar Dickens, MD  Discharge Date:  06/14/2016   Primary MD  [name], MD  Recommendations for primary care physician for things to follow:  - Please check CBC, BMP during next visit - patient to follow with Dr. Sharol Given next week   Admission Diagnosis  Osteomyelitis ([place]) [M86.9] Osteomyelitis of left foot, unspecified chronicity ([place]) [M86.9]   Discharge Diagnosis  Osteomyelitis ([place]) [M86.9] Osteomyelitis of left foot, unspecified chronicity ([place]) [M86.9]   Principal Problem:   Osteomyelitis ([place]) Active Problems:   Diabetes with neurologic complications ([place])   Chronic kidney disease, stage III (moderate)   BPH (benign prostatic hyperplasia)   CAD (coronary artery disease) of artery bypass graft   Bladder cancer (HCC)   Atrial fibrillation ([place])   HTN (hypertension)   Cellulitis   History of COPD   Osteomyelitis of left foot ([place])   Acute renal failure superimposed on stage 3 chronic kidney disease ([place])          Past Medical History:  Diagnosis Date  . Arthritis   . Back pain    s/p lumbar injection 2014  . BENIGN PROSTATIC HYPERTROPHY 05/21/2007  . Bladder cancer ([place]) 10/2011  . CAD (coronary artery disease) CARDIOLOGIST- DR WALL - VISIT IN JUNE 2012 FOR CATH   nonobstructive by cath 6/12:  mid LAD 30%, proximal obtuse marginal-2 30%, proximal RCA 20%, mid RCA 30-40%.  He had normal cardiac output and mildly elevated filling pressures but no significant pulmonary hypertension;   Echocardiogram in May 2012 demonstrated EF 50-55% and left atrial enlargement   . Cellulitis of left foot    Hospitalized in 2006  . CHF (congestive heart failure) ([place])    per family, issues in 2012  . Chronic kidney disease (CKD), stage III (moderate) 12/04/2007   FOLLOWED BY DR PATEL  . Chronic pain of lower extremity   . DIABETES MELLITUS, TYPE II 05/21/2007  . DIVERTICULOSIS, COLON 05/21/2007   pt denies  .  Fatigue AGE-RELATED  . Gross hematuria    pt denies  . HYPERTENSION 05/21/2007  . Impaired hearing BILATERAL HEARING AIDS  . On home oxygen therapy    "2L at nighttime" (02/28/2016)  . PAD (peripheral artery disease) ([place])   . Psoriasis ELBOWS  . PSORIASIS, SCALP 10/25/2008    Past Surgical History:  Procedure Laterality Date  . AMPUTATION Left 03/27/2016   Procedure: partial first AMPUTATION RAY; LEFT;  Surgeon: Trula Slade, DPM;  Location: [facility];  Service: Podiatry;  Laterality: Left;  . AMPUTATION Left 06/12/2016   Procedure: TRANSMETATARSAL AMPUTATION LEFT FOOT;  Surgeon: Newt Minion, MD;  Location: [place];  Service: Orthopedics;  Laterality: Left;  . APPENDECTOMY  1941  . CARDIAC CATHETERIZATION  04-24-11/  DR Rogue Jury ARIDA   MILD NONOBSTRUCTIVE CAD, NORMA CARDIAC OUTPUT  . CARDIOVASCULAR STRESS TEST  2007  . CATARACT EXTRACTION W/ INTRAOCULAR LENS  IMPLANT, BILATERAL Bilateral ~ 2010  . CYSTOSCOPY  12/25/2011   Procedure: CYSTOSCOPY;  Surgeon: Molli Hazard, MD;  Location: [facility];  Service: Urology;  Laterality: N/A;  needs intubation and to be paralyzed  . CYSTOSCOPY W/ RETROGRADES  10/30/2011   Procedure: CYSTOSCOPY WITH RETROGRADE PYELOGRAM;  Surgeon: Molli Hazard, MD;  Location: [facility];  Service: Urology;  Laterality: Bilateral;  CYSTOSCOPY POSS TURBT BILATERAL RETROGRADE PYLEOGRAM  . CYSTOSCOPY W/ RETROGRADES  11/30/2012   Procedure: CYSTOSCOPY WITH RETROGRADE PYELOGRAM;  Surgeon: Molli Hazard,  MD;  Location: [place];  Service: Urology;  Laterality: Bilateral;  Flexible cystoscopy.  . CYSTOSCOPY WITH BIOPSY  11/30/2012   Procedure: CYSTOSCOPY WITH BIOPSY;  Surgeon: Molli Hazard, MD;  Location[facility];  Service: Urology;  Laterality: N/A;  . INCISION AND DRAINAGE FOOT Left ~ 2005   LEFT FOOT DUE TO INFECTION FROM  NAIL PUNCTURE INJURY  .  PENILE PROSTHESIS IMPLANT  1990s  . PERIPHERAL VASCULAR CATHETERIZATION N/A 02/14/2016   Procedure: Abdominal Aortogram w/Lower Extremity;  Surgeon: Wellington Hampshire, MD;  Location: [place] [facility];  Service: Cardiovascular;  Laterality: N/A;  . PERIPHERAL VASCULAR CATHETERIZATION Left 02/28/2016   Procedure: Peripheral Vascular Balloon Angioplasty;  Surgeon: Wellington Hampshire, MD;  Location: [place] [facility];  Service: Cardiovascular;  Laterality: Left;  left peroneal and popliteal artery  . [place]  . TRANSURETHRAL RESECTION OF BLADDER TUMOR  10/30/2011   Procedure: TRANSURETHRAL RESECTION OF BLADDER TUMOR (TURBT);  Surgeon: Molli Hazard, MD;  Location: [facility];  Service: Urology;  Laterality: N/A;  . TRANSURETHRAL RESECTION OF BLADDER TUMOR  12/25/2011   Procedure: TRANSURETHRAL RESECTION OF BLADDER TUMOR (TURBT);  Surgeon: Molli Hazard, MD;  Location: [facility];  Service: Urology;  Laterality: N/A;  need long gyrus instruments  . VASECTOMY  1990s       History of present illness and  Hospital Course:     Kindly see H&P for history of present illness and admission details, please review complete Labs, Consult reports and Test reports for all details in brief  HPI  from the history and physical done on the day of admission 06/10/2016 HPI: [name] Hellmer Brienis a 82 y.o.malewith medical history significant for diabetes type 2 chronic kidney disease, CAD, PVD, A. fib, CHF, bladder cancercame to the emergency department chief complaint worsening left second toe ulceration. Initial evaluation concerning for osteomylitiis.  Information is obtained from the patient and the chart. He states he was seen by podiatry 3 days ago and had the toe debrided. X-ray was done at that time which shows some changes concerning for osseous. Chart review indicates cultures taken at that time he was discharged with  clindamycin and Cipro. Weekend wife reports skin began to slough off and was oozing blood. He denies any headache visual disturbances fever chills nausea vomiting. He denies any abdominal pain diarrhea dysuria hematuria frequency or urgency. He denies any pain in his lower extremity secondary to diabetic neuropathy. He also reports blood sugars running higher than usual. Wife reports over the last 2 months he's had a decreased appetite and has lost approximately 13 pounds unintentionally. He denies any chest pain palpitation shortness of breath.    ED Course:In the emergency department he's afebrile hemodynamically stable and not toxic. He is provided with vancomycin and Zosyn.   Hospital Course   82 year old male with history of coronary artery disease, CHF, diabetes mellitus, PVD, hypertension, BPH, history of bladder cancer, diabetic foot ulcer with osteomyelitis of left great toe status post partial first ray amputation in May this year, who presented with osteomyelitis of the left second toe with failed outpatient therapy. He was following with Dr. Jacqualyn Posey (podiatrist). He initially had a blister with a wound when seen in the office 3 days back on the second toe and was started on clindamycin and ciprofloxacin. Symptoms worsened and he was instructed to come to the ED. seen by Dr. Sharol Given, and went for a  transmetatarsal left foot amputation on 8/2 by Dr. Sharol Given.  Diabetic Osteomyelitis Of left second toe (HCC) - Empiric vancomycin and Zosyn, stopped 24 hours after surgery. - Patient is followed as an outpatient by Dr. Jacqualyn Posey. - Blood cultures so far negative.  - Orthopedic consult obtained per family request for second opinion, Dr. Sharol Given was consulted ,status post transmetatarsal amputation 06/12/2016., Continue with wound VAC and Nitro patch on discharge, to follow with Dr. Sharol Given next week.  Uncontrolled Diabetes mellitus with neurologic complications ([place]) - Continue with Lantus and  insulin sliding scale, A1c is 9.3  - Continue Neurontin  Acute onChronic kidney disease, stage III (moderate) - Back to baseline on discharge, resume Lasix.  BPH (benign prostatic hyperplasia)  CAD (coronary artery disease) of artery bypass graft - Continue statin and Plavix  Peripheral vascular disease - Continue with Plavix   History of bladder cancer  status post chemotherapy  COPD Stable. Continue home inhalers   ** There was mention of A. fib on H&P this admission, as well through previous medical records, reviewed previous EKGs, no evidence of atrial fibrillation  Discharge Condition:  Stable  The above d/w pt.  S/p L foot partial amputation.  In meantime, no fevers.  Minimal pain, controlled with pain meds prn, rare use.  Overall sugar improved in the meantime, now with daily insulin use, d/w pt about dosing, see AVS.  Sugar has been ~160 recently in the AM.  Due for f/u labs .   Still with O2 use at night.  He has prev met criteria for use but needs portable O2 concentrator for travel.  Using [employer] for DME supplies.  He can't walk on L foot currently, so we couldn't ambulated him at the OV to test out exertional O2 level.    No chest pain.  He feels well o/w.   No ade on pain meds.  COPD.  He had been off spiriva.  D/w pt about changing off atrovent back to spiriva with plain SABA prn via neb, med sent.  Reasonable to continue symbicort.     PMH and SH reviewed  ROS: Per HPI unless specifically indicated in ROS section   Meds, vitals, and allergies reviewed.   GEN: nad, alert and oriented, in wheelchair HEENT: mucous membranes moist NECK: supple w/o LA CV: rrr.  PULM: ctab, no inc wob ABD: soft, +bs EXT: no edema SKIN: no acute rash  L foot with transmetatarsal amputation healing well w/o discharge, still sutured.  No erythema.  Still with some sensation on the proximal foot.  Redressed at [facility].

## 2016-06-21 NOTE — Patient Instructions (Addendum)
Add one unit of insulin per day if your AM sugar is above 150.   Decrease one unit of insulin per day if your AM sugar is below 100.   If AM sugar 100-150, no change in insulin.  Update me in a few days about your sugars.  Plan on recheck A1c at the end of October at or before a visit.  Go to the lab on the way out.  We'll contact you with your lab report. Take care.  Glad to see you.  Change to plain albuterol neb with spiriva used daily.  We'll work on the [employer] at home.

## 2016-06-23 ENCOUNTER — Encounter: Payer: Self-pay | Admitting: Family Medicine

## 2016-06-23 DIAGNOSIS — I509 Heart failure, unspecified: Secondary | ICD-10-CM | POA: Diagnosis not present

## 2016-06-23 NOTE — Assessment & Plan Note (Signed)
S/p amputation with wound dressed and has ortho f/u.  App help of all involved.  See notes on labs.

## 2016-06-23 NOTE — Assessment & Plan Note (Addendum)
He had been off spiriva.  D/w pt about changing off atrovent back to spiriva with plain SABA prn via neb, med sent.  Continue symbicort.

## 2016-06-23 NOTE — Assessment & Plan Note (Signed)
Continue adjustment of basal insulin and he'll update me as needed.   Sugar clearly better since amputation.  See AVS.

## 2016-06-23 NOTE — Assessment & Plan Note (Signed)
Still with O2 use at night.  He has prev met criteria for use but needs portable O2 concentrator for travel.  Using [employer] for DME supplies.  He can't walk on L foot currently, so we couldn't ambulated him at the OV to test out exertional O2 level.  We'll check on portable concentrator availability.

## 2016-06-25 DIAGNOSIS — Z794 Long term (current) use of insulin: Secondary | ICD-10-CM | POA: Diagnosis not present

## 2016-06-25 DIAGNOSIS — E1122 Type 2 diabetes mellitus with diabetic chronic kidney disease: Secondary | ICD-10-CM | POA: Diagnosis not present

## 2016-06-25 DIAGNOSIS — Z7951 Long term (current) use of inhaled steroids: Secondary | ICD-10-CM | POA: Diagnosis not present

## 2016-06-25 DIAGNOSIS — Z7901 Long term (current) use of anticoagulants: Secondary | ICD-10-CM | POA: Diagnosis not present

## 2016-06-25 DIAGNOSIS — J449 Chronic obstructive pulmonary disease, unspecified: Secondary | ICD-10-CM | POA: Diagnosis not present

## 2016-06-25 DIAGNOSIS — N400 Enlarged prostate without lower urinary tract symptoms: Secondary | ICD-10-CM | POA: Diagnosis not present

## 2016-06-25 DIAGNOSIS — I4891 Unspecified atrial fibrillation: Secondary | ICD-10-CM | POA: Diagnosis not present

## 2016-06-25 DIAGNOSIS — Z4781 Encounter for orthopedic aftercare following surgical amputation: Secondary | ICD-10-CM | POA: Diagnosis not present

## 2016-06-25 DIAGNOSIS — Z9981 Dependence on supplemental oxygen: Secondary | ICD-10-CM | POA: Diagnosis not present

## 2016-06-25 DIAGNOSIS — I129 Hypertensive chronic kidney disease with stage 1 through stage 4 chronic kidney disease, or unspecified chronic kidney disease: Secondary | ICD-10-CM | POA: Diagnosis not present

## 2016-06-25 DIAGNOSIS — E1151 Type 2 diabetes mellitus with diabetic peripheral angiopathy without gangrene: Secondary | ICD-10-CM | POA: Diagnosis not present

## 2016-06-25 DIAGNOSIS — N183 Chronic kidney disease, stage 3 (moderate): Secondary | ICD-10-CM | POA: Diagnosis not present

## 2016-06-25 DIAGNOSIS — Z89432 Acquired absence of left foot: Secondary | ICD-10-CM | POA: Diagnosis not present

## 2016-06-25 DIAGNOSIS — I2510 Atherosclerotic heart disease of native coronary artery without angina pectoris: Secondary | ICD-10-CM | POA: Diagnosis not present

## 2016-06-25 DIAGNOSIS — Z7984 Long term (current) use of oral hypoglycemic drugs: Secondary | ICD-10-CM | POA: Diagnosis not present

## 2016-06-27 ENCOUNTER — Other Ambulatory Visit: Payer: Self-pay | Admitting: Family Medicine

## 2016-06-27 ENCOUNTER — Ambulatory Visit: Payer: Medicare Other | Admitting: Family Medicine

## 2016-06-27 ENCOUNTER — Encounter: Payer: Self-pay | Admitting: Family Medicine

## 2016-06-28 DIAGNOSIS — N183 Chronic kidney disease, stage 3 (moderate): Secondary | ICD-10-CM | POA: Diagnosis not present

## 2016-06-28 DIAGNOSIS — Z7901 Long term (current) use of anticoagulants: Secondary | ICD-10-CM | POA: Diagnosis not present

## 2016-06-28 DIAGNOSIS — Z9981 Dependence on supplemental oxygen: Secondary | ICD-10-CM | POA: Diagnosis not present

## 2016-06-28 DIAGNOSIS — Z7984 Long term (current) use of oral hypoglycemic drugs: Secondary | ICD-10-CM | POA: Diagnosis not present

## 2016-06-28 DIAGNOSIS — J449 Chronic obstructive pulmonary disease, unspecified: Secondary | ICD-10-CM | POA: Diagnosis not present

## 2016-06-28 DIAGNOSIS — I129 Hypertensive chronic kidney disease with stage 1 through stage 4 chronic kidney disease, or unspecified chronic kidney disease: Secondary | ICD-10-CM | POA: Diagnosis not present

## 2016-06-28 DIAGNOSIS — I2510 Atherosclerotic heart disease of native coronary artery without angina pectoris: Secondary | ICD-10-CM | POA: Diagnosis not present

## 2016-06-28 DIAGNOSIS — E1122 Type 2 diabetes mellitus with diabetic chronic kidney disease: Secondary | ICD-10-CM | POA: Diagnosis not present

## 2016-06-28 DIAGNOSIS — I4891 Unspecified atrial fibrillation: Secondary | ICD-10-CM | POA: Diagnosis not present

## 2016-06-28 DIAGNOSIS — Z89432 Acquired absence of left foot: Secondary | ICD-10-CM | POA: Diagnosis not present

## 2016-06-28 DIAGNOSIS — Z7951 Long term (current) use of inhaled steroids: Secondary | ICD-10-CM | POA: Diagnosis not present

## 2016-06-28 DIAGNOSIS — N400 Enlarged prostate without lower urinary tract symptoms: Secondary | ICD-10-CM | POA: Diagnosis not present

## 2016-06-28 DIAGNOSIS — Z4781 Encounter for orthopedic aftercare following surgical amputation: Secondary | ICD-10-CM | POA: Diagnosis not present

## 2016-06-28 DIAGNOSIS — Z794 Long term (current) use of insulin: Secondary | ICD-10-CM | POA: Diagnosis not present

## 2016-06-28 DIAGNOSIS — E1151 Type 2 diabetes mellitus with diabetic peripheral angiopathy without gangrene: Secondary | ICD-10-CM | POA: Diagnosis not present

## 2016-07-01 DIAGNOSIS — T148 Other injury of unspecified body region: Secondary | ICD-10-CM | POA: Diagnosis not present

## 2016-07-01 DIAGNOSIS — M79605 Pain in left leg: Secondary | ICD-10-CM | POA: Diagnosis not present

## 2016-07-02 ENCOUNTER — Emergency Department (HOSPITAL_COMMUNITY): Payer: Medicare Other

## 2016-07-02 ENCOUNTER — Encounter (HOSPITAL_COMMUNITY): Payer: Self-pay | Admitting: Emergency Medicine

## 2016-07-02 ENCOUNTER — Other Ambulatory Visit: Payer: Self-pay | Admitting: Family Medicine

## 2016-07-02 ENCOUNTER — Emergency Department (HOSPITAL_COMMUNITY)
Admission: EM | Admit: 2016-07-02 | Discharge: 2016-07-02 | Disposition: A | Discharge status: Home / Self Care | Payer: Medicare Other | Attending: Emergency Medicine | Admitting: Emergency Medicine

## 2016-07-02 DIAGNOSIS — Z8551 Personal history of malignant neoplasm of bladder: Secondary | ICD-10-CM | POA: Diagnosis not present

## 2016-07-02 DIAGNOSIS — S8012XA Contusion of left lower leg, initial encounter: Secondary | ICD-10-CM | POA: Diagnosis not present

## 2016-07-02 DIAGNOSIS — N183 Chronic kidney disease, stage 3 (moderate): Secondary | ICD-10-CM | POA: Insufficient documentation

## 2016-07-02 DIAGNOSIS — S41112A Laceration without foreign body of left upper arm, initial encounter: Secondary | ICD-10-CM

## 2016-07-02 DIAGNOSIS — Z7984 Long term (current) use of oral hypoglycemic drugs: Secondary | ICD-10-CM | POA: Diagnosis not present

## 2016-07-02 DIAGNOSIS — I2510 Atherosclerotic heart disease of native coronary artery without angina pectoris: Secondary | ICD-10-CM | POA: Insufficient documentation

## 2016-07-02 DIAGNOSIS — E119 Type 2 diabetes mellitus without complications: Secondary | ICD-10-CM | POA: Diagnosis not present

## 2016-07-02 DIAGNOSIS — I509 Heart failure, unspecified: Secondary | ICD-10-CM | POA: Insufficient documentation

## 2016-07-02 DIAGNOSIS — W1839XA Other fall on same level, initial encounter: Secondary | ICD-10-CM | POA: Insufficient documentation

## 2016-07-02 DIAGNOSIS — Y939 Activity, unspecified: Secondary | ICD-10-CM | POA: Diagnosis not present

## 2016-07-02 DIAGNOSIS — M25562 Pain in left knee: Secondary | ICD-10-CM | POA: Diagnosis not present

## 2016-07-02 DIAGNOSIS — I130 Hypertensive heart and chronic kidney disease with heart failure and stage 1 through stage 4 chronic kidney disease, or unspecified chronic kidney disease: Secondary | ICD-10-CM | POA: Insufficient documentation

## 2016-07-02 DIAGNOSIS — Y999 Unspecified external cause status: Secondary | ICD-10-CM | POA: Insufficient documentation

## 2016-07-02 DIAGNOSIS — Z87891 Personal history of nicotine dependence: Secondary | ICD-10-CM | POA: Insufficient documentation

## 2016-07-02 DIAGNOSIS — S51802A Unspecified open wound of left forearm, initial encounter: Secondary | ICD-10-CM | POA: Insufficient documentation

## 2016-07-02 DIAGNOSIS — W19XXXA Unspecified fall, initial encounter: Secondary | ICD-10-CM

## 2016-07-02 DIAGNOSIS — S8992XA Unspecified injury of left lower leg, initial encounter: Secondary | ICD-10-CM | POA: Diagnosis not present

## 2016-07-02 DIAGNOSIS — Z794 Long term (current) use of insulin: Secondary | ICD-10-CM | POA: Insufficient documentation

## 2016-07-02 DIAGNOSIS — Y92481 Parking lot as the place of occurrence of the external cause: Secondary | ICD-10-CM | POA: Insufficient documentation

## 2016-07-02 DIAGNOSIS — S8002XA Contusion of left knee, initial encounter: Secondary | ICD-10-CM | POA: Diagnosis not present

## 2016-07-02 DIAGNOSIS — M79662 Pain in left lower leg: Secondary | ICD-10-CM | POA: Diagnosis not present

## 2016-07-02 DIAGNOSIS — S51012A Laceration without foreign body of left elbow, initial encounter: Secondary | ICD-10-CM | POA: Diagnosis not present

## 2016-07-02 LAB — CBG MONITORING, ED: Glucose-Capillary: 247 mg/dL — ABNORMAL HIGH (ref 65–99)

## 2016-07-02 MED ORDER — BACITRACIN ZINC 500 UNIT/GM EX OINT
TOPICAL_OINTMENT | Freq: Once | CUTANEOUS | Status: AC
  Administered 2016-07-02: 1 via TOPICAL

## 2016-07-02 MED ORDER — INSULIN GLARGINE 100 UNIT/ML ~~LOC~~ SOLN
15.0000 [IU] | Freq: Once | SUBCUTANEOUS | Status: AC
  Administered 2016-07-02: 15 [IU] via SUBCUTANEOUS
  Filled 2016-07-02: qty 0.15

## 2016-07-02 NOTE — ED Triage Notes (Signed)
Patient is from home, recent partial left foot amputee, around noon today he was getting out of car and fell.  He states that he has pain from knee down to foot he is having pain.  No deformity, no crepitus, CSMT's and pulses intact.  Patient did not hit head.  VSS en route to ED.

## 2016-07-02 NOTE — ED Notes (Signed)
Ambulated pt in the room pt compained of pain to this R leg but was noted to be

## 2016-07-02 NOTE — ED Notes (Signed)
Pt ambulated in the room pt complains of pain to his R leg no other pt ambulated airly well no other complaints noted at this time

## 2016-07-02 NOTE — ED Provider Notes (Addendum)
TIME SEEN: By signing my name below, I, Estanislado Pandy, attest that this documentation has been prepared under the direction and in the presence of [place], DO . Electronically Signed: Estanislado Pandy, Scribe. 07/02/2016. 12:52 AM.   CHIEF COMPLAINT:  Chief Complaint  Patient presents with  . Fall    on blood thinners     HPI Comments:  [name] Small is a 82 y.o. male with PSHx of a partial L foot amputation and PMHx of CAD, CHF, DM, and HTN who presents to the Emergency Department complaining of delayed onset, constant pain in his L knee after a fall at ~11:30 AM yesterday (x 13 hours). Per wife, pt fell getting into a truck in a parking lot injuring his L knee and L elbow, but did not begin complaining about pain in his L leg until later in the day. Pt also presents with a wound on his L elbow sustained during the fall.  Pt is ambulatory with assistance from a caneAt baseline and was using his cane during the fall. Wife notes that his pain is exacerbated by bearing weight. States he is having pain in the left knee and left tibia. Pt takes Plavix. Pt denies hitting his head, dizziness, CP, SOB, headache, numbness, tingling, focal weakness or bone pain in other extremities. Wife reports his tetanus vaccination is up-to-date. He took pain medication prior to arrival and states he is feeling better.  States that he thinks he fell because his leg "locked up". Denies any preceding symptoms that caused him to fall.  Wife reports that he did not take his Lantus today. Normally takes around 15 units at 3 PM. He is also on glipizide which she did take this morning. She is requesting that we check his blood sugar. He reports he was 83 this morning.   ROS: See HPI Constitutional: no fever  Eyes: no drainage  ENT: no runny nose   Cardiovascular:  no chest pain  Resp: no SOB  GI: no vomiting GU: no dysuria Integumentary: no rash  Allergy: no hives  Musculoskeletal: no leg swelling   Neurological: no slurred speech ROS otherwise negative  PAST MEDICAL HISTORY/PAST SURGICAL HISTORY:  Past Medical History:  Diagnosis Date  . Arthritis   . Back pain    s/p lumbar injection 2014  . BENIGN PROSTATIC HYPERTROPHY 05/21/2007  . Bladder cancer ([place]) 10/2011  . CAD (coronary artery disease) CARDIOLOGIST- DR WALL - VISIT IN JUNE 2012 FOR CATH   nonobstructive by cath 6/12:  mid LAD 30%, proximal obtuse marginal-2 30%, proximal RCA 20%, mid RCA 30-40%.  He had normal cardiac output and mildly elevated filling pressures but no significant pulmonary hypertension;   Echocardiogram in May 2012 demonstrated EF 50-55% and left atrial enlargement   . Cellulitis of left foot    Hospitalized in 2006  . CHF (congestive heart failure) ([place])    per family, issues in 2012  . Chronic kidney disease (CKD), stage III (moderate) 12/04/2007   FOLLOWED BY DR PATEL  . Chronic pain of lower extremity   . DIABETES MELLITUS, TYPE II 05/21/2007  . DIVERTICULOSIS, COLON 05/21/2007   pt denies  . Fatigue AGE-RELATED  . Gross hematuria    pt denies  . HYPERTENSION 05/21/2007  . Impaired hearing BILATERAL HEARING AIDS  . On home oxygen therapy    "2L at nighttime" (02/28/2016)  . PAD (peripheral artery disease) ([place])   . Psoriasis ELBOWS  . PSORIASIS, SCALP 10/25/2008    MEDICATIONS:  Prior to Admission medications   Medication Sig Start Date End Date Taking? Authorizing Provider  acetaminophen (TYLENOL) 500 MG tablet Take 1,000 mg by mouth every 8 (eight) hours as needed for mild pain, moderate pain or headache.    Historical Provider, MD  albuterol (PROVENTIL) (2.5 MG/3ML) 0.083% nebulizer solution Take 3 mLs (2.5 mg total) by nebulization every 6 (six) hours as needed for wheezing or shortness of breath. 06/21/16   Tonia Ghent, MD  amLODipine (NORVASC) 5 MG tablet Take 1 tablet (5 mg total) by mouth daily. 11/10/15   Tonia Ghent, MD  atorvastatin (LIPITOR) 10 MG tablet Take 1 tablet (10  mg total) by mouth daily. 02/23/16   Wellington Hampshire, MD  budesonide-formoterol (SYMBICORT) 160-4.5 MCG/ACT inhaler Inhale 2 puffs into the lungs 2 (two) times daily as needed (shortness of breath).  01/21/14   Tonia Ghent, MD  clobetasol (TEMOVATE) 0.05 % external solution Apply 1 application topically daily.    Historical Provider, MD  clopidogrel (PLAVIX) 75 MG tablet Take 1 tablet by mouth  daily 02/08/16   Tonia Ghent, MD  furosemide (LASIX) 40 MG tablet TAKE 1 TABLET BY MOUTH  EVERY MONDAY, WEDNESDAY,  AND FRIDAY 06/27/16   Tonia Ghent, MD  gabapentin (NEURONTIN) 100 MG capsule Take 100 mg by mouth 3 (three) times daily.    Historical Provider, MD  glipiZIDE (GLUCOTROL XL) 5 MG 24 hr tablet Take 1 tablet by mouth  daily with breakfast 04/26/16   Tonia Ghent, MD  insulin glargine (LANTUS) 100 UNIT/ML injection Inject 0.15 mLs (15 Units total) into the skin daily. 06/21/16   Tonia Ghent, MD  nitroGLYCERIN (NITRODUR - DOSED IN MG/24 HR) 0.2 mg/hr patch Place 1 patch (0.2 mg total) onto the skin daily. 06/12/16   Newt Minion, MD  oxyCODONE (OXY IR/ROXICODONE) 5 MG immediate release tablet Take 1 tablet (5 mg total) by mouth every 6 (six) hours as needed for severe pain or breakthrough pain. 06/14/16   Silver Huguenin Elgergawy, MD  polyethylene glycol (MIRALAX / GLYCOLAX) packet Take 17 g by mouth daily as needed. 03/29/16   Oswald Hillock, MD  tiotropium (SPIRIVA) 18 MCG inhalation capsule Place 1 capsule (18 mcg total) into inhaler and inhale daily. 06/21/16   Tonia Ghent, MD    ALLERGIES:  Allergies  Allergen Reactions  . Actos [Pioglitazone Hydrochloride] Other (See Comments)    Held 2012 bladder cancer  . Aspirin Other (See Comments)    Held 2012 due to hematuria and bruising.   . Ace Inhibitors Cough  . Gabapentin     Upset stomach and diarrhea - tolerates low doses  . Lyrica [Pregabalin] Other (See Comments)    swelling  . Pravastatin Other (See Comments)    Swelling of feet   . Bactrim Rash and Other (See Comments)    Rash, presumed allergy  . Sulfa Drugs Cross Reactors Rash    SOCIAL HISTORY:  Social History  Substance Use Topics  . Smoking status: Former Smoker    Packs/day: 2.00    Years: 22.00    Types: Cigarettes    Quit date: 11/11/1968  . Smokeless tobacco: Never Used  . Alcohol use 1.2 oz/week    2 Glasses of wine per week     Comment: occassional    FAMILY HISTORY: Family History  Problem Relation Age of Onset  . Diabetes Mother   . Heart disease Brother   . Heart disease Brother   .  Heart disease Brother   . Heart disease Brother   . Heart disease Brother   . Heart disease Brother   . Lung cancer Brother     EXAM: BP 144/58 (BP Location: Left Arm)   Pulse 70   Temp 97.8 F (36.6 C) (Oral)   SpO2 99%  CONSTITUTIONAL: Alert and oriented and responds appropriately to questions. Well-appearing; well-nourished; GCS 37, elderly, laughing, joking, in no apparent distress HEAD: Normocephalic; atraumatic EYES: Conjunctivae clear, PERRL, EOMI ENT: normal nose; no rhinorrhea; moist mucous membranes; pharynx without lesions noted; no dental injury; no septal hematoma NECK: Supple, no meningismus, no LAD; no midline spinal tenderness, step-off or deformity CARD: RRR; S1 and S2 appreciated; no murmurs, no clicks, no rubs, no gallops RESP: Normal chest excursion without splinting or tachypnea; breath sounds clear and equal bilaterally; no wheezes, no rhonchi, no rales; no hypoxia or respiratory distress CHEST:  chest wall stable, no crepitus or ecchymosis or deformity, nontender to palpation ABD/GI: Normal bowel sounds; non-distended; soft, non-tender, no rebound, no guarding PELVIS:  stable, nontender to palpation BACK:  The back appears normal and is non-tender to palpation, there is no CVA tenderness; no midline spinal tenderness, step-off or deformity EXT: Normal ROM in all joints; tender to palpation over the left knee and tibia and fibula  without bony deformity, ecchymosis. No joint effusion. No ligamentous laxity of the left knee. No tenderness of the left ankle or foot. No tenderness over the left femur or hip. No other bony tenderness. Compartments are soft. 2+ DP pulses bilaterally. Status post transmetatarsal a palpitation of the left foot with no erythema, warmth or drainage.  Skin tear noted to the left elbow but no bony deformity of the left elbow. Otherwise external these are nontender to palpation. SKIN: Normal color for age and race; warm NEURO: Moves all extremities equally, sensation to light touch intact diffusely, cranial nerves II through XII intact PSYCH: The patient's mood and manner are appropriate. Grooming and personal hygiene are appropriate.  MEDICAL DECISION MAKING: Patient here with mechanical fall. Has skin tear to the left elbow. We will clean, dress this wound. No lacerations that require repair. His tetanus is up-to-date. Complaint of left knee and tibia pain earlier today with pain in weight but feels much better after taking oxycodone at home. No other sign of trauma on exam. Will obtain x-rays of these areas. Wife is also requesting that we check his blood sugar as he has not had his Lantus today.  ED PROGRESS: Patient's x-ray show no fracture dislocation. Wife is requesting a knee sleeve for comfort. He has been ambulatory in the emergency department. Have advised them to apply ice, elevate leg as needed. He has pain medication at home. Blood glucose was mildly elevated. He was given his dose of home Lantus. Discussed return pressures with patient and wife were comfortable with plan for discharge home.   At this time, I do not feel there is any life-threatening condition present. I have reviewed and discussed all results (EKG, imaging, lab, urine as appropriate), exam findings with patient/family. I have reviewed nursing notes and appropriate previous records.  I feel the patient is safe to be discharged  home without further emergent workup and can continue workup as an outpatient. Discussed usual and customary return precautions. Patient/family verbalize understanding and are comfortable with this plan.  Outpatient follow-up has been provided. All questions have been answered.    I personally performed the services described in this documentation, which was scribed  in my presence. The recorded information has been reviewed and is accurate.      [place], DO 07/02/16 [place], DO 07/02/16 [id number]

## 2016-07-02 NOTE — ED Notes (Signed)
Dr. Ward at bedside.

## 2016-07-02 NOTE — Discharge Instructions (Signed)
Please clean wound on elbow once a day and apply Neosporin and a clean dressing.  Your x-rays today showed no fracture or dislocation of your left leg. If you continue to have pain, please follow up with your primary care physician. You may use a knee sleeve for comfort.

## 2016-07-03 ENCOUNTER — Encounter: Payer: Self-pay | Admitting: Family Medicine

## 2016-07-03 ENCOUNTER — Ambulatory Visit (INDEPENDENT_AMBULATORY_CARE_PROVIDER_SITE_OTHER): Payer: Medicare Other | Admitting: Family Medicine

## 2016-07-03 VITALS — BP 110/60 | HR 82 | Temp 98.2°F | Wt 196.0 lb

## 2016-07-03 DIAGNOSIS — M25522 Pain in left elbow: Secondary | ICD-10-CM | POA: Diagnosis not present

## 2016-07-03 DIAGNOSIS — R0781 Pleurodynia: Secondary | ICD-10-CM

## 2016-07-03 DIAGNOSIS — W19XXXD Unspecified fall, subsequent encounter: Secondary | ICD-10-CM

## 2016-07-03 DIAGNOSIS — M25562 Pain in left knee: Secondary | ICD-10-CM

## 2016-07-03 DIAGNOSIS — W19XXXA Unspecified fall, initial encounter: Secondary | ICD-10-CM

## 2016-07-03 MED ORDER — OXYCODONE HCL 7.5 MG PO TABS: 7.5000 mg | ORAL_TABLET | Freq: Four times a day (QID) | ORAL | 0 refills | Status: DC | PRN

## 2016-07-03 NOTE — Patient Instructions (Signed)
[name] Small will call about your referral. Take care.  Glad to see you.  Use the doughnut cushion when sitting.  Sedation caution on the oxycodone.

## 2016-07-03 NOTE — Progress Notes (Signed)
Fell 2 days ago. L elbow and rib and knee pain.  He was walking with a cane before the fall, now with trouble walking due to pain.  Can't lay flat due to pain.  Having to sleep in recliner.  His buttock is at risk of getting irritated from the constant sitting, dw pt about cautions.  L arm dressed prev.  No fx on L leg on xrays.  Can bear some weight, but not reliably enough walk.    Oxycodone 5mg  has no effect for pain control.    Meds, vitals, and allergies reviewed.   ROS: Per HPI unless specifically indicated in ROS section   GEN: nad, alert and oriented, in wheelchair HEENT: mucous membranes moist NECK: supple w/o LA CV: rrr. PULM: ctab, no inc wob ABD: soft, +bs EXT: no edema SKIN: L elbow with skin tear noted, clean o/w.  Redressed.   L calf not ttp, no bruising locally.

## 2016-07-03 NOTE — Progress Notes (Signed)
Pre visit review using our clinic review tool, if applicable. No additional management support is needed unless otherwise documented below in the visit note. 

## 2016-07-04 DIAGNOSIS — W19XXXA Unspecified fall, initial encounter: Secondary | ICD-10-CM | POA: Insufficient documentation

## 2016-07-04 NOTE — Assessment & Plan Note (Signed)
Discussed with patient about options. He needs to get set back up with nursing and PT at home. Set up home health. He is not able to leave the house without significant assistance. He'll need help caring for skin tear also with strengthening. In the meantime oxycodone 5 mg is not enough for his pain. He likely has multiple sources for pain with soft tissue injuries in the thorax and left elbow. I'm assuming that some of the pain in his left leg is neuropathic, from the fall. Increase oxycodone dose the meantime. See after visit summer. Routine cautions given. Continue local wound care with nonstick dressing on the skin tear. >25 minutes spent in face to face time with patient, >50% spent in counselling or coordination of care

## 2016-07-06 DIAGNOSIS — N400 Enlarged prostate without lower urinary tract symptoms: Secondary | ICD-10-CM | POA: Diagnosis not present

## 2016-07-06 DIAGNOSIS — I4891 Unspecified atrial fibrillation: Secondary | ICD-10-CM | POA: Diagnosis not present

## 2016-07-06 DIAGNOSIS — K5790 Diverticulosis of intestine, part unspecified, without perforation or abscess without bleeding: Secondary | ICD-10-CM | POA: Diagnosis not present

## 2016-07-06 DIAGNOSIS — S51012D Laceration without foreign body of left elbow, subsequent encounter: Secondary | ICD-10-CM | POA: Diagnosis not present

## 2016-07-06 DIAGNOSIS — E1122 Type 2 diabetes mellitus with diabetic chronic kidney disease: Secondary | ICD-10-CM | POA: Diagnosis not present

## 2016-07-06 DIAGNOSIS — I5032 Chronic diastolic (congestive) heart failure: Secondary | ICD-10-CM | POA: Diagnosis not present

## 2016-07-06 DIAGNOSIS — N183 Chronic kidney disease, stage 3 (moderate): Secondary | ICD-10-CM | POA: Diagnosis not present

## 2016-07-06 DIAGNOSIS — E1151 Type 2 diabetes mellitus with diabetic peripheral angiopathy without gangrene: Secondary | ICD-10-CM | POA: Diagnosis not present

## 2016-07-06 DIAGNOSIS — I130 Hypertensive heart and chronic kidney disease with heart failure and stage 1 through stage 4 chronic kidney disease, or unspecified chronic kidney disease: Secondary | ICD-10-CM | POA: Diagnosis not present

## 2016-07-06 DIAGNOSIS — Z794 Long term (current) use of insulin: Secondary | ICD-10-CM | POA: Diagnosis not present

## 2016-07-06 DIAGNOSIS — M79662 Pain in left lower leg: Secondary | ICD-10-CM | POA: Diagnosis not present

## 2016-07-06 DIAGNOSIS — J449 Chronic obstructive pulmonary disease, unspecified: Secondary | ICD-10-CM | POA: Diagnosis not present

## 2016-07-06 DIAGNOSIS — I2510 Atherosclerotic heart disease of native coronary artery without angina pectoris: Secondary | ICD-10-CM | POA: Diagnosis not present

## 2016-07-08 ENCOUNTER — Telehealth: Payer: Self-pay

## 2016-07-08 NOTE — Telephone Encounter (Signed)
Left detailed message on voicemail.  

## 2016-07-08 NOTE — Telephone Encounter (Signed)
[name] Small with [facility] left v/m requesting verbal order for home health nursing 2 x a week for 4 weeks,1 x a week for 3 weeks with 3 prn visits to address skin tear on lt elbow and med mgt. Amanda request cb.

## 2016-07-08 NOTE — Telephone Encounter (Signed)
Please give the order.  Thanks.   

## 2016-07-09 ENCOUNTER — Encounter (INDEPENDENT_AMBULATORY_CARE_PROVIDER_SITE_OTHER): Payer: Self-pay

## 2016-07-09 DIAGNOSIS — E119 Type 2 diabetes mellitus without complications: Secondary | ICD-10-CM | POA: Diagnosis not present

## 2016-07-10 DIAGNOSIS — I2510 Atherosclerotic heart disease of native coronary artery without angina pectoris: Secondary | ICD-10-CM | POA: Diagnosis not present

## 2016-07-10 DIAGNOSIS — I5032 Chronic diastolic (congestive) heart failure: Secondary | ICD-10-CM | POA: Diagnosis not present

## 2016-07-10 DIAGNOSIS — K5790 Diverticulosis of intestine, part unspecified, without perforation or abscess without bleeding: Secondary | ICD-10-CM | POA: Diagnosis not present

## 2016-07-10 DIAGNOSIS — I4891 Unspecified atrial fibrillation: Secondary | ICD-10-CM | POA: Diagnosis not present

## 2016-07-10 DIAGNOSIS — M79662 Pain in left lower leg: Secondary | ICD-10-CM | POA: Diagnosis not present

## 2016-07-10 DIAGNOSIS — E1151 Type 2 diabetes mellitus with diabetic peripheral angiopathy without gangrene: Secondary | ICD-10-CM | POA: Diagnosis not present

## 2016-07-10 DIAGNOSIS — S51012D Laceration without foreign body of left elbow, subsequent encounter: Secondary | ICD-10-CM | POA: Diagnosis not present

## 2016-07-10 DIAGNOSIS — Z794 Long term (current) use of insulin: Secondary | ICD-10-CM | POA: Diagnosis not present

## 2016-07-10 DIAGNOSIS — E1122 Type 2 diabetes mellitus with diabetic chronic kidney disease: Secondary | ICD-10-CM | POA: Diagnosis not present

## 2016-07-10 DIAGNOSIS — I509 Heart failure, unspecified: Secondary | ICD-10-CM | POA: Diagnosis not present

## 2016-07-10 DIAGNOSIS — N400 Enlarged prostate without lower urinary tract symptoms: Secondary | ICD-10-CM | POA: Diagnosis not present

## 2016-07-10 DIAGNOSIS — I130 Hypertensive heart and chronic kidney disease with heart failure and stage 1 through stage 4 chronic kidney disease, or unspecified chronic kidney disease: Secondary | ICD-10-CM | POA: Diagnosis not present

## 2016-07-10 DIAGNOSIS — J449 Chronic obstructive pulmonary disease, unspecified: Secondary | ICD-10-CM | POA: Diagnosis not present

## 2016-07-10 DIAGNOSIS — N183 Chronic kidney disease, stage 3 (moderate): Secondary | ICD-10-CM | POA: Diagnosis not present

## 2016-07-12 ENCOUNTER — Other Ambulatory Visit: Payer: Self-pay

## 2016-07-12 ENCOUNTER — Telehealth: Payer: Self-pay

## 2016-07-12 DIAGNOSIS — I4891 Unspecified atrial fibrillation: Secondary | ICD-10-CM | POA: Diagnosis not present

## 2016-07-12 DIAGNOSIS — E1122 Type 2 diabetes mellitus with diabetic chronic kidney disease: Secondary | ICD-10-CM | POA: Diagnosis not present

## 2016-07-12 DIAGNOSIS — J449 Chronic obstructive pulmonary disease, unspecified: Secondary | ICD-10-CM | POA: Diagnosis not present

## 2016-07-12 DIAGNOSIS — E1151 Type 2 diabetes mellitus with diabetic peripheral angiopathy without gangrene: Secondary | ICD-10-CM | POA: Diagnosis not present

## 2016-07-12 DIAGNOSIS — Z794 Long term (current) use of insulin: Secondary | ICD-10-CM | POA: Diagnosis not present

## 2016-07-12 DIAGNOSIS — M79662 Pain in left lower leg: Secondary | ICD-10-CM | POA: Diagnosis not present

## 2016-07-12 DIAGNOSIS — K5790 Diverticulosis of intestine, part unspecified, without perforation or abscess without bleeding: Secondary | ICD-10-CM | POA: Diagnosis not present

## 2016-07-12 DIAGNOSIS — I5032 Chronic diastolic (congestive) heart failure: Secondary | ICD-10-CM | POA: Diagnosis not present

## 2016-07-12 DIAGNOSIS — I2510 Atherosclerotic heart disease of native coronary artery without angina pectoris: Secondary | ICD-10-CM | POA: Diagnosis not present

## 2016-07-12 DIAGNOSIS — N183 Chronic kidney disease, stage 3 (moderate): Secondary | ICD-10-CM | POA: Diagnosis not present

## 2016-07-12 DIAGNOSIS — I130 Hypertensive heart and chronic kidney disease with heart failure and stage 1 through stage 4 chronic kidney disease, or unspecified chronic kidney disease: Secondary | ICD-10-CM | POA: Diagnosis not present

## 2016-07-12 DIAGNOSIS — N400 Enlarged prostate without lower urinary tract symptoms: Secondary | ICD-10-CM | POA: Diagnosis not present

## 2016-07-12 DIAGNOSIS — S51012D Laceration without foreign body of left elbow, subsequent encounter: Secondary | ICD-10-CM | POA: Diagnosis not present

## 2016-07-12 MED ORDER — OXYCODONE HCL 7.5 MG PO TABS: 7.5000 mg | ORAL_TABLET | Freq: Four times a day (QID) | ORAL | 0 refills | Status: DC | PRN

## 2016-07-12 NOTE — Telephone Encounter (Signed)
Please give the order.  Thanks.   

## 2016-07-12 NOTE — Telephone Encounter (Signed)
Printed.  Thanks.  

## 2016-07-12 NOTE — Telephone Encounter (Signed)
Wes with [facility] requests orders for continuation of service visits for the following:  1 additional visit this week then 2 times per week times 4 weeks thereafter.

## 2016-07-12 NOTE — Telephone Encounter (Addendum)
Wes PT with [facility] advised.

## 2016-07-12 NOTE — Telephone Encounter (Signed)
Mrs Wetzel [facility] signed) request refill oxycodone. Call when ready for pick up. Pt last seen and rx last printed # 40 on 07/03/16.

## 2016-07-12 NOTE — Telephone Encounter (Signed)
Patient advised.  Rx left at front desk for pick up. 

## 2016-07-13 DIAGNOSIS — J449 Chronic obstructive pulmonary disease, unspecified: Secondary | ICD-10-CM | POA: Diagnosis not present

## 2016-07-16 ENCOUNTER — Ambulatory Visit: Payer: Medicare Other | Admitting: Cardiovascular Disease

## 2016-07-17 ENCOUNTER — Telehealth: Payer: Self-pay | Admitting: Family Medicine

## 2016-07-17 DIAGNOSIS — N183 Chronic kidney disease, stage 3 (moderate): Secondary | ICD-10-CM | POA: Diagnosis not present

## 2016-07-17 DIAGNOSIS — K5790 Diverticulosis of intestine, part unspecified, without perforation or abscess without bleeding: Secondary | ICD-10-CM | POA: Diagnosis not present

## 2016-07-17 DIAGNOSIS — I2510 Atherosclerotic heart disease of native coronary artery without angina pectoris: Secondary | ICD-10-CM | POA: Diagnosis not present

## 2016-07-17 DIAGNOSIS — J449 Chronic obstructive pulmonary disease, unspecified: Secondary | ICD-10-CM | POA: Diagnosis not present

## 2016-07-17 DIAGNOSIS — Z794 Long term (current) use of insulin: Secondary | ICD-10-CM | POA: Diagnosis not present

## 2016-07-17 DIAGNOSIS — E1151 Type 2 diabetes mellitus with diabetic peripheral angiopathy without gangrene: Secondary | ICD-10-CM | POA: Diagnosis not present

## 2016-07-17 DIAGNOSIS — N400 Enlarged prostate without lower urinary tract symptoms: Secondary | ICD-10-CM | POA: Diagnosis not present

## 2016-07-17 DIAGNOSIS — S51012D Laceration without foreign body of left elbow, subsequent encounter: Secondary | ICD-10-CM | POA: Diagnosis not present

## 2016-07-17 DIAGNOSIS — I4891 Unspecified atrial fibrillation: Secondary | ICD-10-CM | POA: Diagnosis not present

## 2016-07-17 DIAGNOSIS — M79662 Pain in left lower leg: Secondary | ICD-10-CM | POA: Diagnosis not present

## 2016-07-17 DIAGNOSIS — I5032 Chronic diastolic (congestive) heart failure: Secondary | ICD-10-CM | POA: Diagnosis not present

## 2016-07-17 DIAGNOSIS — I130 Hypertensive heart and chronic kidney disease with heart failure and stage 1 through stage 4 chronic kidney disease, or unspecified chronic kidney disease: Secondary | ICD-10-CM | POA: Diagnosis not present

## 2016-07-17 DIAGNOSIS — E1122 Type 2 diabetes mellitus with diabetic chronic kidney disease: Secondary | ICD-10-CM | POA: Diagnosis not present

## 2016-07-17 NOTE — Telephone Encounter (Signed)
Patient wasn't able to get up at all from sitting position in any chair. They got a lift chair recliner from [place] and [facility] said you could write a RX for the chair and it may be covered. Call Mrs Knuckles when the RX is written and she will pick up . Best number [phone]

## 2016-07-18 NOTE — Telephone Encounter (Signed)
Hardcopy rx done, dx W19.XXXA, R53.1.  Thanks.

## 2016-07-18 NOTE — Progress Notes (Signed)
DOS 05.17.2017 Left foot wound debridement, washout, bone biopsy

## 2016-07-18 NOTE — Telephone Encounter (Signed)
Wife advised.  Rx left at front desk for pick up.  

## 2016-07-19 DIAGNOSIS — N400 Enlarged prostate without lower urinary tract symptoms: Secondary | ICD-10-CM | POA: Diagnosis not present

## 2016-07-19 DIAGNOSIS — K5790 Diverticulosis of intestine, part unspecified, without perforation or abscess without bleeding: Secondary | ICD-10-CM | POA: Diagnosis not present

## 2016-07-19 DIAGNOSIS — I4891 Unspecified atrial fibrillation: Secondary | ICD-10-CM | POA: Diagnosis not present

## 2016-07-19 DIAGNOSIS — J449 Chronic obstructive pulmonary disease, unspecified: Secondary | ICD-10-CM | POA: Diagnosis not present

## 2016-07-19 DIAGNOSIS — I2510 Atherosclerotic heart disease of native coronary artery without angina pectoris: Secondary | ICD-10-CM | POA: Diagnosis not present

## 2016-07-19 DIAGNOSIS — M79662 Pain in left lower leg: Secondary | ICD-10-CM | POA: Diagnosis not present

## 2016-07-19 DIAGNOSIS — S51012D Laceration without foreign body of left elbow, subsequent encounter: Secondary | ICD-10-CM | POA: Diagnosis not present

## 2016-07-19 DIAGNOSIS — N183 Chronic kidney disease, stage 3 (moderate): Secondary | ICD-10-CM | POA: Diagnosis not present

## 2016-07-19 DIAGNOSIS — E1151 Type 2 diabetes mellitus with diabetic peripheral angiopathy without gangrene: Secondary | ICD-10-CM | POA: Diagnosis not present

## 2016-07-19 DIAGNOSIS — I130 Hypertensive heart and chronic kidney disease with heart failure and stage 1 through stage 4 chronic kidney disease, or unspecified chronic kidney disease: Secondary | ICD-10-CM | POA: Diagnosis not present

## 2016-07-19 DIAGNOSIS — E1122 Type 2 diabetes mellitus with diabetic chronic kidney disease: Secondary | ICD-10-CM | POA: Diagnosis not present

## 2016-07-19 DIAGNOSIS — I5032 Chronic diastolic (congestive) heart failure: Secondary | ICD-10-CM | POA: Diagnosis not present

## 2016-07-19 DIAGNOSIS — Z794 Long term (current) use of insulin: Secondary | ICD-10-CM | POA: Diagnosis not present

## 2016-07-22 ENCOUNTER — Telehealth: Payer: Self-pay

## 2016-07-22 DIAGNOSIS — M79662 Pain in left lower leg: Secondary | ICD-10-CM | POA: Diagnosis not present

## 2016-07-22 DIAGNOSIS — K5790 Diverticulosis of intestine, part unspecified, without perforation or abscess without bleeding: Secondary | ICD-10-CM | POA: Diagnosis not present

## 2016-07-22 DIAGNOSIS — I4891 Unspecified atrial fibrillation: Secondary | ICD-10-CM | POA: Diagnosis not present

## 2016-07-22 DIAGNOSIS — N183 Chronic kidney disease, stage 3 (moderate): Secondary | ICD-10-CM | POA: Diagnosis not present

## 2016-07-22 DIAGNOSIS — Z794 Long term (current) use of insulin: Secondary | ICD-10-CM | POA: Diagnosis not present

## 2016-07-22 DIAGNOSIS — I130 Hypertensive heart and chronic kidney disease with heart failure and stage 1 through stage 4 chronic kidney disease, or unspecified chronic kidney disease: Secondary | ICD-10-CM | POA: Diagnosis not present

## 2016-07-22 DIAGNOSIS — E1122 Type 2 diabetes mellitus with diabetic chronic kidney disease: Secondary | ICD-10-CM | POA: Diagnosis not present

## 2016-07-22 DIAGNOSIS — E1151 Type 2 diabetes mellitus with diabetic peripheral angiopathy without gangrene: Secondary | ICD-10-CM | POA: Diagnosis not present

## 2016-07-22 DIAGNOSIS — J449 Chronic obstructive pulmonary disease, unspecified: Secondary | ICD-10-CM | POA: Diagnosis not present

## 2016-07-22 DIAGNOSIS — S51012D Laceration without foreign body of left elbow, subsequent encounter: Secondary | ICD-10-CM | POA: Diagnosis not present

## 2016-07-22 DIAGNOSIS — N400 Enlarged prostate without lower urinary tract symptoms: Secondary | ICD-10-CM | POA: Diagnosis not present

## 2016-07-22 DIAGNOSIS — I2510 Atherosclerotic heart disease of native coronary artery without angina pectoris: Secondary | ICD-10-CM | POA: Diagnosis not present

## 2016-07-22 DIAGNOSIS — I5032 Chronic diastolic (congestive) heart failure: Secondary | ICD-10-CM | POA: Diagnosis not present

## 2016-07-22 NOTE — Telephone Encounter (Signed)
Marjory Lies with [facility] left v/m pt had fall over weekend; no apparent injury;no new issues. protocol to notify PCP. FYI to Dr Damita Dunnings.

## 2016-07-23 ENCOUNTER — Telehealth: Payer: Self-pay | Admitting: Cardiovascular Disease

## 2016-07-23 ENCOUNTER — Ambulatory Visit (INDEPENDENT_AMBULATORY_CARE_PROVIDER_SITE_OTHER): Payer: Medicare Other | Admitting: Cardiovascular Disease

## 2016-07-23 VITALS — BP 139/62 | HR 65 | Ht 74.0 in | Wt 193.0 lb

## 2016-07-23 DIAGNOSIS — E785 Hyperlipidemia, unspecified: Secondary | ICD-10-CM | POA: Diagnosis not present

## 2016-07-23 DIAGNOSIS — I739 Peripheral vascular disease, unspecified: Secondary | ICD-10-CM | POA: Diagnosis not present

## 2016-07-23 NOTE — Telephone Encounter (Signed)
Marjory Lies notified as instructed by telephone and verbalized understanding. [name] stated that the patient had no bruising or signs of a fall.

## 2016-07-23 NOTE — Progress Notes (Signed)
Cardiology Office Note   Date:  07/23/2016   ID:  [name] Small, DOB August 09, 1928, MRN [id number]  PCP:  [name], MD  Cardiologist:   Dr. Meda Coffee  Chief Complaint  Patient presents with  . Follow-up      History of Present Illness: [name] Small is a 82 y.o. male who presents for a follow up visit regarding peripheral arterial disease. The patient has known history of mild nonobstructive coronary artery disease,Chronic kidney disease, hypertension and diabetes. He quit smoking more than 40 years ago. He had bladder cancer in 2012 treated with chemotherapy. He had critical limb ischemia with nonhealing ulcer on the left foot earlier this year. Lower extremity angiography in April showed an occluded distal left popliteal artery with reconstitution via extensive collaterals to the proximal to mid peroneal artery which was the only patent vessel below the knee.  I performed successful angioplasty to the popliteal and peroneal artery with good results. The patient continued to have to infections and ultimately underwent amputation of all the toes on the left side. The amputation site has healed completely. Since then, he had problems with his balance and fell twice. He is going to get specialized shoes.   Past Medical History:  Diagnosis Date  . Arthritis   . Back pain    s/p lumbar injection 2014  . BENIGN PROSTATIC HYPERTROPHY 05/21/2007  . Bladder cancer ([place]) 10/2011  . CAD (coronary artery disease) CARDIOLOGIST- DR WALL - VISIT IN JUNE 2012 FOR CATH   nonobstructive by cath 6/12:  mid LAD 30%, proximal obtuse marginal-2 30%, proximal RCA 20%, mid RCA 30-40%.  He had normal cardiac output and mildly elevated filling pressures but no significant pulmonary hypertension;   Echocardiogram in May 2012 demonstrated EF 50-55% and left atrial enlargement   . Cellulitis of left foot    Hospitalized in 2006  . CHF (congestive heart failure) ([place])    per family, issues in 2012  .  Chronic kidney disease (CKD), stage III (moderate) 12/04/2007   FOLLOWED BY DR PATEL  . Chronic pain of lower extremity   . DIABETES MELLITUS, TYPE II 05/21/2007  . DIVERTICULOSIS, COLON 05/21/2007   pt denies  . Fatigue AGE-RELATED  . Gross hematuria    pt denies  . HYPERTENSION 05/21/2007  . Impaired hearing BILATERAL HEARING AIDS  . On home oxygen therapy    "2L at nighttime" (02/28/2016)  . PAD (peripheral artery disease) ([place])   . Psoriasis ELBOWS  . PSORIASIS, SCALP 10/25/2008    Past Surgical History:  Procedure Laterality Date  . AMPUTATION Left 03/27/2016   Procedure: partial first AMPUTATION RAY; LEFT;  Surgeon: Trula Slade, DPM;  Location: [place];  Service: Podiatry;  Laterality: Left;  . AMPUTATION Left 06/12/2016   Procedure: TRANSMETATARSAL AMPUTATION LEFT FOOT;  Surgeon: Newt Minion, MD;  Location: [place];  Service: Orthopedics;  Laterality: Left;  . APPENDECTOMY  1941  . CARDIAC CATHETERIZATION  04-24-11/  DR Rogue Jury Wynee Matarazzo   MILD NONOBSTRUCTIVE CAD, NORMA CARDIAC OUTPUT  . CARDIOVASCULAR STRESS TEST  2007  . CATARACT EXTRACTION W/ INTRAOCULAR LENS  IMPLANT, BILATERAL Bilateral ~ 2010  . CYSTOSCOPY  12/25/2011   Procedure: CYSTOSCOPY;  Surgeon: Molli Hazard, MD;  [facility];  Service: Urology;  Laterality: N/A;  needs intubation and to be paralyzed   . CYSTOSCOPY W/ RETROGRADES  10/30/2011   Procedure: CYSTOSCOPY WITH RETROGRADE PYELOGRAM;  Surgeon: Molli Hazard, MD;  Location: [facility]  [place];  Service: Urology;  Laterality: Bilateral;  CYSTOSCOPY POSS TURBT BILATERAL RETROGRADE PYLEOGRAM   . CYSTOSCOPY W/ RETROGRADES  11/30/2012   Procedure: CYSTOSCOPY WITH RETROGRADE PYELOGRAM;  Surgeon: Molli Hazard, MD;  Location: [facility];  Service: Urology;  Laterality: Bilateral;  Flexible cystoscopy.  . CYSTOSCOPY WITH BIOPSY  11/30/2012   Procedure: CYSTOSCOPY WITH BIOPSY;  Surgeon: Molli Hazard, MD;  Location: [facility];  Service: Urology;  Laterality: N/A;  . INCISION AND DRAINAGE FOOT Left ~ 2005   LEFT FOOT DUE TO INFECTION FROM  NAIL PUNCTURE INJURY  . PENILE PROSTHESIS IMPLANT  1990s  . PERIPHERAL VASCULAR CATHETERIZATION N/A 02/14/2016   Procedure: Abdominal Aortogram w/Lower Extremity;  Surgeon: Wellington Hampshire, MD;  Location: [place] [facility];  Service: Cardiovascular;  Laterality: N/A;  . PERIPHERAL VASCULAR CATHETERIZATION Left 02/28/2016   Procedure: Peripheral Vascular Balloon Angioplasty;  Surgeon: Wellington Hampshire, MD;  Location: [place] [facility];  Service: Cardiovascular;  Laterality: Left;  left peroneal and popliteal artery  . [place]  . TRANSURETHRAL RESECTION OF BLADDER TUMOR  10/30/2011   Procedure: TRANSURETHRAL RESECTION OF BLADDER TUMOR (TURBT);  Surgeon: Molli Hazard, MD;  Location: [facility];  Service: Urology;  Laterality: N/A;  . TRANSURETHRAL RESECTION OF BLADDER TUMOR  12/25/2011   Procedure: TRANSURETHRAL RESECTION OF BLADDER TUMOR (TURBT);  Surgeon: Molli Hazard, MD;  Location: [facility];  Service: Urology;  Laterality: N/A;  need long gyrus instruments   . VASECTOMY  1990s     Current Outpatient Prescriptions  Medication Sig Dispense Refill  . acetaminophen (TYLENOL) 500 MG tablet Take 1,000 mg by mouth every 8 (eight) hours as needed for mild pain, moderate pain or headache.    . albuterol (PROVENTIL) (2.5 MG/3ML) 0.083% nebulizer solution Take 3 mLs (2.5 mg total) by nebulization every 6 (six) hours as needed for wheezing or shortness of breath. 150 mL 2  . amLODipine (NORVASC) 5 MG tablet Take 1 tablet (5 mg total) by mouth daily.    Marland Kitchen atorvastatin (LIPITOR) 10 MG tablet Take 1 tablet (10 mg total) by mouth daily. 90 tablet 3  . budesonide-formoterol (SYMBICORT) 160-4.5 MCG/ACT inhaler Inhale 2 puffs into the lungs 2 (two) times daily  as needed (shortness of breath).     . clobetasol (TEMOVATE) 0.05 % external solution Apply 1 application topically daily.    . clopidogrel (PLAVIX) 75 MG tablet Take 1 tablet by mouth  daily 90 tablet 1  . furosemide (LASIX) 40 MG tablet TAKE 1 TABLET BY MOUTH  EVERY MONDAY, WEDNESDAY,  AND FRIDAY 39 tablet 1  . gabapentin (NEURONTIN) 100 MG capsule Take 100 mg by mouth 3 (three) times daily.    Marland Kitchen glipiZIDE (GLUCOTROL XL) 5 MG 24 hr tablet Take 1 tablet by mouth  daily with breakfast 90 tablet 1  . insulin glargine (LANTUS) 100 UNIT/ML injection Inject 0.15 mLs (15 Units total) into the skin daily. 10 mL   . nitroGLYCERIN (NITRODUR - DOSED IN MG/24 HR) 0.2 mg/hr patch Place 1 patch (0.2 mg total) onto the skin daily. 30 patch 12  . OxyCODONE HCl 7.5 MG TABA Take 7.5-15 mg by mouth every 6 (six) hours as needed (pain. sedation caution). 40 tablet 0  . polyethylene glycol (MIRALAX / GLYCOLAX) packet Take 17 g by mouth daily as needed. (Patient taking differently: Take 17 g by mouth daily as needed for mild constipation. )  14 each 0  . tiotropium (SPIRIVA) 18 MCG inhalation capsule Place 1 capsule (18 mcg total) into inhaler and inhale daily. 90 capsule 3   No current facility-administered medications for this visit.     Allergies:   Actos [pioglitazone hydrochloride]; Aspirin; Ace inhibitors; Gabapentin; Bactrim; Lyrica [pregabalin]; Pravastatin; and Sulfa drugs cross reactors    Social History:  The patient  reports that he quit smoking about 47 years ago. His smoking use included Cigarettes. He has a 44.00 pack-year smoking history. He has never used smokeless tobacco. He reports that he drinks about 1.2 oz of alcohol per week . He reports that he does not use drugs.   Family History:  The patient's family history includes Diabetes in his mother; Heart disease in his brother, brother, brother, brother, brother, and brother; Lung cancer in his brother.    ROS:  Please see the history of  present illness.   Otherwise, review of systems are positive for none.   All other systems are reviewed and negative.    PHYSICAL EXAM: VS:  BP 139/62 (BP Location: Left Arm, Patient Position: Sitting, Cuff Size: Normal)   Pulse 65   Ht 6\' 2"  (1.88 m)   Wt 193 lb (87.5 kg)   SpO2 94%   BMI 24.78 kg/m  , BMI Body mass index is 24.78 kg/m. GEN: Well nourished, well developed, in no acute distress  HEENT: normal  Neck: no JVD, carotid bruits, or masses Cardiac: RRR; no murmurs, rubs, or gallops,no edema  Respiratory:  clear to auscultation bilaterally, normal work of breathing GI: soft, nontender, nondistended, + BS MS: no deformity or atrophy  Skin: warm and dry, no rash Neuro:  Strength and sensation are intact Psych: euthymic mood, full affect Vascular: Femoral pulses are normal bilaterally.   EKG:  EKG is not ordered today.  Recent Labs: 11/28/2015: ALT 12 03/27/2016: B Natriuretic Peptide 125.3 06/21/2016: BUN 42; Creatinine, Ser 2.14; Hemoglobin 13.2; Platelets 354.0; Potassium 4.9; Sodium 137    Lipid Panel    Component Value Date/Time   CHOL 116 (L) 11/28/2015 0933   TRIG 81 11/28/2015 0933   HDL 43 11/28/2015 0933   CHOLHDL 2.7 11/28/2015 0933   VLDL 16 11/28/2015 0933   LDLCALC 57 11/28/2015 0933   LDLDIRECT 147.6 04/27/2009 1012      Wt Readings from Last 3 Encounters:  07/23/16 193 lb (87.5 kg)  07/09/16 201 lb (91.2 kg)  07/03/16 196 lb (88.9 kg)       ASSESSMENT AND PLAN:   1. Peripheral arterial disease:   He is status post successful angioplasty to the left popliteal and peroneal artery. He had arterial Doppler study done in May which showed normal flow. I requested left lower extremity arterial duplex in 2 months from now to check for restenosis per protocol. Otherwise continue dual antiplatelet therapy.  2. Hyperlipidemia: Continue treatment with atorvastatin with a target LDL of less than 70.   3. Chronic kidney disease: Renal function has been  stable.    Disposition:   FU with me in 2 months  Signed,  [name], MD  07/23/2016 9:35 AM    [place]

## 2016-07-23 NOTE — Patient Instructions (Signed)
Medication Instructions:  Your physician recommends that you continue on your current medications as directed. Please refer to the Current Medication list given to you today.  Labwork: No new orders.   Testing/Procedures: Your physician has requested that you have a LEFT LEG lower extremity arterial doppler in 2 MONTHS at the [place] office. This test is an ultrasound of the arteries in the legs.  It looks at arterial blood flow in the legs. There are no restrictions or special instructions  Follow-Up: Your physician recommends that you schedule a follow-up appointment in: 2 MONTHS with Dr Fletcher Anon in the [place] office the same day as lower arterial doppler   Any Other Special Instructions Will Be Listed Below (If Applicable).     If you need a refill on your cardiac medications before your next appointment, please call your pharmacy.

## 2016-07-23 NOTE — Telephone Encounter (Signed)
Noted. If any other complaints, please let me know. Thanks.

## 2016-07-23 NOTE — Telephone Encounter (Signed)
I called the patients home and left a voicemail regarding his appointment for his left leg doppler and followup with Dr. Fletcher Anon on 09-30-16 at the [facility] office.

## 2016-07-26 ENCOUNTER — Other Ambulatory Visit: Payer: Self-pay | Admitting: Family Medicine

## 2016-07-26 ENCOUNTER — Telehealth: Payer: Self-pay | Admitting: Family Medicine

## 2016-07-26 NOTE — Telephone Encounter (Signed)
PPW faxed [phone]

## 2016-07-26 NOTE — Telephone Encounter (Signed)
Electronic refill request. Last Filled:    90 tablet 1 02/08/2016   Please advise.

## 2016-07-26 NOTE — Telephone Encounter (Signed)
I've done all the paperwork I've gotten on this patient.   If they need some urgently, then they shouldn't send it as a fax.

## 2016-07-26 NOTE — Telephone Encounter (Signed)
[facility] Called checking on paperwork(letter of medical necessity and dm statement that was faxed on 9/11 She stated pt has appointment Monday if they don't received paperwork back they will have to cancel appointment.  I asked her to refax paperwork

## 2016-07-28 NOTE — Telephone Encounter (Signed)
Sent. Thanks.   

## 2016-07-29 ENCOUNTER — Other Ambulatory Visit: Payer: Self-pay

## 2016-07-29 DIAGNOSIS — Z89432 Acquired absence of left foot: Secondary | ICD-10-CM | POA: Diagnosis not present

## 2016-07-29 DIAGNOSIS — I70262 Atherosclerosis of native arteries of extremities with gangrene, left leg: Secondary | ICD-10-CM | POA: Diagnosis not present

## 2016-07-29 DIAGNOSIS — M5432 Sciatica, left side: Secondary | ICD-10-CM | POA: Diagnosis not present

## 2016-07-29 DIAGNOSIS — E11621 Type 2 diabetes mellitus with foot ulcer: Secondary | ICD-10-CM | POA: Diagnosis not present

## 2016-07-29 NOTE — Telephone Encounter (Signed)
[name] pts wife (DPR signed) left v/m requesting rx oxycodone. Call when ready for pick up. Last printed # 40 on 07/12/16 and last seen 07/03/16.

## 2016-07-30 MED ORDER — OXYCODONE HCL 7.5 MG PO TABS: 7.5000 mg | ORAL_TABLET | Freq: Four times a day (QID) | ORAL | 0 refills | Status: DC | PRN

## 2016-07-30 NOTE — Telephone Encounter (Signed)
Printed.  Thanks.  

## 2016-07-30 NOTE — Telephone Encounter (Signed)
Wife advised.  Rx left at front desk for pick up.  

## 2016-08-02 ENCOUNTER — Ambulatory Visit: Payer: Medicare Other

## 2016-08-02 ENCOUNTER — Ambulatory Visit (INDEPENDENT_AMBULATORY_CARE_PROVIDER_SITE_OTHER): Payer: Medicare Other

## 2016-08-02 DIAGNOSIS — Z23 Encounter for immunization: Secondary | ICD-10-CM | POA: Diagnosis not present

## 2016-08-07 ENCOUNTER — Telehealth: Payer: Self-pay

## 2016-08-07 DIAGNOSIS — I4891 Unspecified atrial fibrillation: Secondary | ICD-10-CM | POA: Diagnosis not present

## 2016-08-07 DIAGNOSIS — S51012D Laceration without foreign body of left elbow, subsequent encounter: Secondary | ICD-10-CM | POA: Diagnosis not present

## 2016-08-07 DIAGNOSIS — J449 Chronic obstructive pulmonary disease, unspecified: Secondary | ICD-10-CM | POA: Diagnosis not present

## 2016-08-07 DIAGNOSIS — E1151 Type 2 diabetes mellitus with diabetic peripheral angiopathy without gangrene: Secondary | ICD-10-CM | POA: Diagnosis not present

## 2016-08-07 DIAGNOSIS — K5790 Diverticulosis of intestine, part unspecified, without perforation or abscess without bleeding: Secondary | ICD-10-CM | POA: Diagnosis not present

## 2016-08-07 DIAGNOSIS — I130 Hypertensive heart and chronic kidney disease with heart failure and stage 1 through stage 4 chronic kidney disease, or unspecified chronic kidney disease: Secondary | ICD-10-CM | POA: Diagnosis not present

## 2016-08-07 DIAGNOSIS — E1122 Type 2 diabetes mellitus with diabetic chronic kidney disease: Secondary | ICD-10-CM | POA: Diagnosis not present

## 2016-08-07 DIAGNOSIS — Z794 Long term (current) use of insulin: Secondary | ICD-10-CM | POA: Diagnosis not present

## 2016-08-07 DIAGNOSIS — N400 Enlarged prostate without lower urinary tract symptoms: Secondary | ICD-10-CM | POA: Diagnosis not present

## 2016-08-07 DIAGNOSIS — N183 Chronic kidney disease, stage 3 (moderate): Secondary | ICD-10-CM | POA: Diagnosis not present

## 2016-08-07 DIAGNOSIS — I5032 Chronic diastolic (congestive) heart failure: Secondary | ICD-10-CM | POA: Diagnosis not present

## 2016-08-07 DIAGNOSIS — M79662 Pain in left lower leg: Secondary | ICD-10-CM | POA: Diagnosis not present

## 2016-08-07 DIAGNOSIS — I2510 Atherosclerotic heart disease of native coronary artery without angina pectoris: Secondary | ICD-10-CM | POA: Diagnosis not present

## 2016-08-07 MED ORDER — CLOTRIMAZOLE-BETAMETHASONE 1-0.05 % EX CREA: 1.0000 "application " | TOPICAL_CREAM | Freq: Two times a day (BID) | CUTANEOUS | 0 refills | Status: DC

## 2016-08-07 NOTE — Telephone Encounter (Signed)
These to be a combination local irritation and fungal infection. Start Lotrisone. If not improved, or if there is a concern for bacterial infection (fever, significant pain, draining pus, or spreading redness), then let me know. Try to offload the pressure while sitting as much as possible, by using a cushion and rotating position. Thanks.

## 2016-08-07 NOTE — Telephone Encounter (Signed)
Patient's wife notified as instructed by telephone and verbalized understanding. Patient's wife stated that she is keeping a close eye on the area and will call back if she has any other concerns.Marland Kitchen

## 2016-08-07 NOTE — Telephone Encounter (Signed)
[name] Small (DPR signed) left v/m; pt last seen 07/03/16.pt has been sitting a lot in a lift chair due to knee injury; pt has developed some raw spots in crack of buttocks; [name] has been using Desitin on the area but not helping; [name] Small request med sent to [facility]. [name] Small request cb.

## 2016-08-08 DIAGNOSIS — N183 Chronic kidney disease, stage 3 (moderate): Secondary | ICD-10-CM | POA: Diagnosis not present

## 2016-08-08 DIAGNOSIS — I5032 Chronic diastolic (congestive) heart failure: Secondary | ICD-10-CM | POA: Diagnosis not present

## 2016-08-08 DIAGNOSIS — J449 Chronic obstructive pulmonary disease, unspecified: Secondary | ICD-10-CM | POA: Diagnosis not present

## 2016-08-08 DIAGNOSIS — K5790 Diverticulosis of intestine, part unspecified, without perforation or abscess without bleeding: Secondary | ICD-10-CM | POA: Diagnosis not present

## 2016-08-08 DIAGNOSIS — S51012D Laceration without foreign body of left elbow, subsequent encounter: Secondary | ICD-10-CM | POA: Diagnosis not present

## 2016-08-08 DIAGNOSIS — I130 Hypertensive heart and chronic kidney disease with heart failure and stage 1 through stage 4 chronic kidney disease, or unspecified chronic kidney disease: Secondary | ICD-10-CM | POA: Diagnosis not present

## 2016-08-08 DIAGNOSIS — I2510 Atherosclerotic heart disease of native coronary artery without angina pectoris: Secondary | ICD-10-CM | POA: Diagnosis not present

## 2016-08-08 DIAGNOSIS — Z794 Long term (current) use of insulin: Secondary | ICD-10-CM | POA: Diagnosis not present

## 2016-08-08 DIAGNOSIS — M79662 Pain in left lower leg: Secondary | ICD-10-CM | POA: Diagnosis not present

## 2016-08-08 DIAGNOSIS — I4891 Unspecified atrial fibrillation: Secondary | ICD-10-CM | POA: Diagnosis not present

## 2016-08-08 DIAGNOSIS — E1151 Type 2 diabetes mellitus with diabetic peripheral angiopathy without gangrene: Secondary | ICD-10-CM | POA: Diagnosis not present

## 2016-08-08 DIAGNOSIS — E1122 Type 2 diabetes mellitus with diabetic chronic kidney disease: Secondary | ICD-10-CM | POA: Diagnosis not present

## 2016-08-08 DIAGNOSIS — N400 Enlarged prostate without lower urinary tract symptoms: Secondary | ICD-10-CM | POA: Diagnosis not present

## 2016-08-10 DIAGNOSIS — I509 Heart failure, unspecified: Secondary | ICD-10-CM | POA: Diagnosis not present

## 2016-08-12 ENCOUNTER — Telehealth: Payer: Self-pay

## 2016-08-12 DIAGNOSIS — I4891 Unspecified atrial fibrillation: Secondary | ICD-10-CM | POA: Diagnosis not present

## 2016-08-12 DIAGNOSIS — I2510 Atherosclerotic heart disease of native coronary artery without angina pectoris: Secondary | ICD-10-CM | POA: Diagnosis not present

## 2016-08-12 DIAGNOSIS — N183 Chronic kidney disease, stage 3 (moderate): Secondary | ICD-10-CM | POA: Diagnosis not present

## 2016-08-12 DIAGNOSIS — Z794 Long term (current) use of insulin: Secondary | ICD-10-CM | POA: Diagnosis not present

## 2016-08-12 DIAGNOSIS — S51012D Laceration without foreign body of left elbow, subsequent encounter: Secondary | ICD-10-CM | POA: Diagnosis not present

## 2016-08-12 DIAGNOSIS — N400 Enlarged prostate without lower urinary tract symptoms: Secondary | ICD-10-CM | POA: Diagnosis not present

## 2016-08-12 DIAGNOSIS — K5790 Diverticulosis of intestine, part unspecified, without perforation or abscess without bleeding: Secondary | ICD-10-CM | POA: Diagnosis not present

## 2016-08-12 DIAGNOSIS — J449 Chronic obstructive pulmonary disease, unspecified: Secondary | ICD-10-CM | POA: Diagnosis not present

## 2016-08-12 DIAGNOSIS — E1122 Type 2 diabetes mellitus with diabetic chronic kidney disease: Secondary | ICD-10-CM | POA: Diagnosis not present

## 2016-08-12 DIAGNOSIS — I5032 Chronic diastolic (congestive) heart failure: Secondary | ICD-10-CM | POA: Diagnosis not present

## 2016-08-12 DIAGNOSIS — E1151 Type 2 diabetes mellitus with diabetic peripheral angiopathy without gangrene: Secondary | ICD-10-CM | POA: Diagnosis not present

## 2016-08-12 DIAGNOSIS — I130 Hypertensive heart and chronic kidney disease with heart failure and stage 1 through stage 4 chronic kidney disease, or unspecified chronic kidney disease: Secondary | ICD-10-CM | POA: Diagnosis not present

## 2016-08-12 DIAGNOSIS — M79662 Pain in left lower leg: Secondary | ICD-10-CM | POA: Diagnosis not present

## 2016-08-12 NOTE — Telephone Encounter (Signed)
[facility]  is calling to request extended PT twice a week for three weeks.

## 2016-08-12 NOTE — Telephone Encounter (Signed)
Wes with [facility] advised.

## 2016-08-12 NOTE — Telephone Encounter (Signed)
Please give the order.  Thanks.   

## 2016-08-16 ENCOUNTER — Other Ambulatory Visit: Payer: Self-pay

## 2016-08-16 DIAGNOSIS — E1151 Type 2 diabetes mellitus with diabetic peripheral angiopathy without gangrene: Secondary | ICD-10-CM | POA: Diagnosis not present

## 2016-08-16 DIAGNOSIS — I5032 Chronic diastolic (congestive) heart failure: Secondary | ICD-10-CM | POA: Diagnosis not present

## 2016-08-16 DIAGNOSIS — I4891 Unspecified atrial fibrillation: Secondary | ICD-10-CM | POA: Diagnosis not present

## 2016-08-16 DIAGNOSIS — N183 Chronic kidney disease, stage 3 (moderate): Secondary | ICD-10-CM | POA: Diagnosis not present

## 2016-08-16 DIAGNOSIS — I2510 Atherosclerotic heart disease of native coronary artery without angina pectoris: Secondary | ICD-10-CM | POA: Diagnosis not present

## 2016-08-16 DIAGNOSIS — N400 Enlarged prostate without lower urinary tract symptoms: Secondary | ICD-10-CM | POA: Diagnosis not present

## 2016-08-16 DIAGNOSIS — M79662 Pain in left lower leg: Secondary | ICD-10-CM | POA: Diagnosis not present

## 2016-08-16 DIAGNOSIS — E1122 Type 2 diabetes mellitus with diabetic chronic kidney disease: Secondary | ICD-10-CM | POA: Diagnosis not present

## 2016-08-16 DIAGNOSIS — S51012D Laceration without foreign body of left elbow, subsequent encounter: Secondary | ICD-10-CM | POA: Diagnosis not present

## 2016-08-16 DIAGNOSIS — J449 Chronic obstructive pulmonary disease, unspecified: Secondary | ICD-10-CM | POA: Diagnosis not present

## 2016-08-16 DIAGNOSIS — Z794 Long term (current) use of insulin: Secondary | ICD-10-CM | POA: Diagnosis not present

## 2016-08-16 DIAGNOSIS — K5790 Diverticulosis of intestine, part unspecified, without perforation or abscess without bleeding: Secondary | ICD-10-CM | POA: Diagnosis not present

## 2016-08-16 DIAGNOSIS — I130 Hypertensive heart and chronic kidney disease with heart failure and stage 1 through stage 4 chronic kidney disease, or unspecified chronic kidney disease: Secondary | ICD-10-CM | POA: Diagnosis not present

## 2016-08-16 MED ORDER — OXYCODONE HCL 7.5 MG PO TABS: 7.5000 mg | ORAL_TABLET | Freq: Four times a day (QID) | ORAL | 0 refills | Status: DC | PRN

## 2016-08-16 NOTE — Telephone Encounter (Signed)
Printed.  Thanks.  

## 2016-08-16 NOTE — Telephone Encounter (Signed)
Patient advised.  Rx left at front desk for pick up. 

## 2016-08-16 NOTE — Telephone Encounter (Signed)
[name] Small (DPR signed) left v/m requesting rx for oxycodone. Call when ready for pick up. Last printed # 40 on 07/30/16. Last seen 07/03/16.

## 2016-08-19 DIAGNOSIS — S51012D Laceration without foreign body of left elbow, subsequent encounter: Secondary | ICD-10-CM | POA: Diagnosis not present

## 2016-08-19 DIAGNOSIS — J449 Chronic obstructive pulmonary disease, unspecified: Secondary | ICD-10-CM | POA: Diagnosis not present

## 2016-08-19 DIAGNOSIS — N183 Chronic kidney disease, stage 3 (moderate): Secondary | ICD-10-CM | POA: Diagnosis not present

## 2016-08-19 DIAGNOSIS — I4891 Unspecified atrial fibrillation: Secondary | ICD-10-CM | POA: Diagnosis not present

## 2016-08-19 DIAGNOSIS — E1151 Type 2 diabetes mellitus with diabetic peripheral angiopathy without gangrene: Secondary | ICD-10-CM | POA: Diagnosis not present

## 2016-08-19 DIAGNOSIS — I130 Hypertensive heart and chronic kidney disease with heart failure and stage 1 through stage 4 chronic kidney disease, or unspecified chronic kidney disease: Secondary | ICD-10-CM | POA: Diagnosis not present

## 2016-08-19 DIAGNOSIS — I2510 Atherosclerotic heart disease of native coronary artery without angina pectoris: Secondary | ICD-10-CM | POA: Diagnosis not present

## 2016-08-19 DIAGNOSIS — K5790 Diverticulosis of intestine, part unspecified, without perforation or abscess without bleeding: Secondary | ICD-10-CM | POA: Diagnosis not present

## 2016-08-19 DIAGNOSIS — M79662 Pain in left lower leg: Secondary | ICD-10-CM | POA: Diagnosis not present

## 2016-08-19 DIAGNOSIS — I5032 Chronic diastolic (congestive) heart failure: Secondary | ICD-10-CM | POA: Diagnosis not present

## 2016-08-19 DIAGNOSIS — E1122 Type 2 diabetes mellitus with diabetic chronic kidney disease: Secondary | ICD-10-CM | POA: Diagnosis not present

## 2016-08-19 DIAGNOSIS — Z794 Long term (current) use of insulin: Secondary | ICD-10-CM | POA: Diagnosis not present

## 2016-08-19 DIAGNOSIS — N400 Enlarged prostate without lower urinary tract symptoms: Secondary | ICD-10-CM | POA: Diagnosis not present

## 2016-08-21 ENCOUNTER — Ambulatory Visit (INDEPENDENT_AMBULATORY_CARE_PROVIDER_SITE_OTHER): Payer: Medicare Other | Admitting: Family

## 2016-08-21 DIAGNOSIS — M5442 Lumbago with sciatica, left side: Secondary | ICD-10-CM | POA: Diagnosis not present

## 2016-08-22 DIAGNOSIS — I130 Hypertensive heart and chronic kidney disease with heart failure and stage 1 through stage 4 chronic kidney disease, or unspecified chronic kidney disease: Secondary | ICD-10-CM | POA: Diagnosis not present

## 2016-08-22 DIAGNOSIS — K5790 Diverticulosis of intestine, part unspecified, without perforation or abscess without bleeding: Secondary | ICD-10-CM | POA: Diagnosis not present

## 2016-08-22 DIAGNOSIS — Z794 Long term (current) use of insulin: Secondary | ICD-10-CM | POA: Diagnosis not present

## 2016-08-22 DIAGNOSIS — E1122 Type 2 diabetes mellitus with diabetic chronic kidney disease: Secondary | ICD-10-CM | POA: Diagnosis not present

## 2016-08-22 DIAGNOSIS — S51012D Laceration without foreign body of left elbow, subsequent encounter: Secondary | ICD-10-CM | POA: Diagnosis not present

## 2016-08-22 DIAGNOSIS — E1151 Type 2 diabetes mellitus with diabetic peripheral angiopathy without gangrene: Secondary | ICD-10-CM | POA: Diagnosis not present

## 2016-08-22 DIAGNOSIS — M79662 Pain in left lower leg: Secondary | ICD-10-CM | POA: Diagnosis not present

## 2016-08-22 DIAGNOSIS — N400 Enlarged prostate without lower urinary tract symptoms: Secondary | ICD-10-CM | POA: Diagnosis not present

## 2016-08-22 DIAGNOSIS — I4891 Unspecified atrial fibrillation: Secondary | ICD-10-CM | POA: Diagnosis not present

## 2016-08-22 DIAGNOSIS — J449 Chronic obstructive pulmonary disease, unspecified: Secondary | ICD-10-CM | POA: Diagnosis not present

## 2016-08-22 DIAGNOSIS — I5032 Chronic diastolic (congestive) heart failure: Secondary | ICD-10-CM | POA: Diagnosis not present

## 2016-08-22 DIAGNOSIS — I2510 Atherosclerotic heart disease of native coronary artery without angina pectoris: Secondary | ICD-10-CM | POA: Diagnosis not present

## 2016-08-22 DIAGNOSIS — N183 Chronic kidney disease, stage 3 (moderate): Secondary | ICD-10-CM | POA: Diagnosis not present

## 2016-08-23 ENCOUNTER — Other Ambulatory Visit (INDEPENDENT_AMBULATORY_CARE_PROVIDER_SITE_OTHER): Payer: Self-pay | Admitting: Orthopedic Surgery

## 2016-08-23 DIAGNOSIS — M5432 Sciatica, left side: Secondary | ICD-10-CM

## 2016-08-23 DIAGNOSIS — M5431 Sciatica, right side: Secondary | ICD-10-CM

## 2016-08-26 ENCOUNTER — Encounter: Payer: Self-pay | Admitting: Family Medicine

## 2016-08-26 ENCOUNTER — Telehealth: Payer: Self-pay | Admitting: Family Medicine

## 2016-08-26 DIAGNOSIS — Z794 Long term (current) use of insulin: Secondary | ICD-10-CM | POA: Diagnosis not present

## 2016-08-26 DIAGNOSIS — N183 Chronic kidney disease, stage 3 (moderate): Secondary | ICD-10-CM | POA: Diagnosis not present

## 2016-08-26 DIAGNOSIS — S51012D Laceration without foreign body of left elbow, subsequent encounter: Secondary | ICD-10-CM | POA: Diagnosis not present

## 2016-08-26 DIAGNOSIS — I130 Hypertensive heart and chronic kidney disease with heart failure and stage 1 through stage 4 chronic kidney disease, or unspecified chronic kidney disease: Secondary | ICD-10-CM | POA: Diagnosis not present

## 2016-08-26 DIAGNOSIS — E1122 Type 2 diabetes mellitus with diabetic chronic kidney disease: Secondary | ICD-10-CM | POA: Diagnosis not present

## 2016-08-26 DIAGNOSIS — I4891 Unspecified atrial fibrillation: Secondary | ICD-10-CM | POA: Diagnosis not present

## 2016-08-26 DIAGNOSIS — I2510 Atherosclerotic heart disease of native coronary artery without angina pectoris: Secondary | ICD-10-CM | POA: Diagnosis not present

## 2016-08-26 DIAGNOSIS — N400 Enlarged prostate without lower urinary tract symptoms: Secondary | ICD-10-CM | POA: Diagnosis not present

## 2016-08-26 DIAGNOSIS — J449 Chronic obstructive pulmonary disease, unspecified: Secondary | ICD-10-CM | POA: Diagnosis not present

## 2016-08-26 DIAGNOSIS — I5032 Chronic diastolic (congestive) heart failure: Secondary | ICD-10-CM | POA: Diagnosis not present

## 2016-08-26 DIAGNOSIS — K5790 Diverticulosis of intestine, part unspecified, without perforation or abscess without bleeding: Secondary | ICD-10-CM | POA: Diagnosis not present

## 2016-08-26 DIAGNOSIS — M79662 Pain in left lower leg: Secondary | ICD-10-CM | POA: Diagnosis not present

## 2016-08-26 DIAGNOSIS — E1151 Type 2 diabetes mellitus with diabetic peripheral angiopathy without gangrene: Secondary | ICD-10-CM | POA: Diagnosis not present

## 2016-08-26 NOTE — Telephone Encounter (Signed)
Wife advised. 

## 2016-08-26 NOTE — Telephone Encounter (Signed)
Patient's wife says the patient has been having very loose stools for the past 3 days or so.  He is not taking any stool softeners or Miralax that would cause it.  Patient's wife doesn't know what to give him to try to help the situation.  Also patient has 2 or 3 sores at the top of his buttock area that is raw.  Wife has tried some of the cream that was prescribed and it is helping but not really healing.  They have tried the donut rings, etc because he sits so much but nothing seems to really help.

## 2016-08-26 NOTE — Telephone Encounter (Signed)
I would try otc imodium 1-2 tabs a day as needed for loose stools.  Would be reasonable to try OTC hydrocolloid dressing on the spots, assuming the spots are clean.  The dressing with continued attempts to off load the pressure with the donut ring/etc are likely reasonable options.   Let me know if that isn't helping.   Thanks.

## 2016-08-26 NOTE — Telephone Encounter (Signed)
See my chart message. Please call can see about getting more information in the meantime.   Thanks.

## 2016-08-26 NOTE — Telephone Encounter (Signed)
Tried home number on numerous occasions with busy signal.  Left message on patient's wife's cell voicemail to return call.

## 2016-08-28 ENCOUNTER — Ambulatory Visit
Admission: RE | Admit: 2016-08-28 | Discharge: 2016-08-28 | Disposition: A | Discharge status: Home / Self Care | Payer: Medicare Other | Source: Ambulatory Visit | Attending: Orthopedic Surgery | Admitting: Orthopedic Surgery

## 2016-08-28 DIAGNOSIS — M48061 Spinal stenosis, lumbar region without neurogenic claudication: Secondary | ICD-10-CM | POA: Diagnosis not present

## 2016-08-28 DIAGNOSIS — Z794 Long term (current) use of insulin: Secondary | ICD-10-CM | POA: Diagnosis not present

## 2016-08-28 DIAGNOSIS — N183 Chronic kidney disease, stage 3 (moderate): Secondary | ICD-10-CM | POA: Diagnosis not present

## 2016-08-28 DIAGNOSIS — S51012D Laceration without foreign body of left elbow, subsequent encounter: Secondary | ICD-10-CM | POA: Diagnosis not present

## 2016-08-28 DIAGNOSIS — I2510 Atherosclerotic heart disease of native coronary artery without angina pectoris: Secondary | ICD-10-CM | POA: Diagnosis not present

## 2016-08-28 DIAGNOSIS — M79662 Pain in left lower leg: Secondary | ICD-10-CM | POA: Diagnosis not present

## 2016-08-28 DIAGNOSIS — J449 Chronic obstructive pulmonary disease, unspecified: Secondary | ICD-10-CM | POA: Diagnosis not present

## 2016-08-28 DIAGNOSIS — E1151 Type 2 diabetes mellitus with diabetic peripheral angiopathy without gangrene: Secondary | ICD-10-CM | POA: Diagnosis not present

## 2016-08-28 DIAGNOSIS — I5032 Chronic diastolic (congestive) heart failure: Secondary | ICD-10-CM | POA: Diagnosis not present

## 2016-08-28 DIAGNOSIS — E1122 Type 2 diabetes mellitus with diabetic chronic kidney disease: Secondary | ICD-10-CM | POA: Diagnosis not present

## 2016-08-28 DIAGNOSIS — K5790 Diverticulosis of intestine, part unspecified, without perforation or abscess without bleeding: Secondary | ICD-10-CM | POA: Diagnosis not present

## 2016-08-28 DIAGNOSIS — I130 Hypertensive heart and chronic kidney disease with heart failure and stage 1 through stage 4 chronic kidney disease, or unspecified chronic kidney disease: Secondary | ICD-10-CM | POA: Diagnosis not present

## 2016-08-28 DIAGNOSIS — I4891 Unspecified atrial fibrillation: Secondary | ICD-10-CM | POA: Diagnosis not present

## 2016-08-28 DIAGNOSIS — M5432 Sciatica, left side: Secondary | ICD-10-CM

## 2016-08-28 DIAGNOSIS — M5431 Sciatica, right side: Secondary | ICD-10-CM

## 2016-08-28 DIAGNOSIS — N400 Enlarged prostate without lower urinary tract symptoms: Secondary | ICD-10-CM | POA: Diagnosis not present

## 2016-08-29 ENCOUNTER — Telehealth: Payer: Self-pay | Admitting: Family Medicine

## 2016-08-29 MED ORDER — OXYCODONE HCL 7.5 MG PO TABS: 7.5000 mg | ORAL_TABLET | Freq: Four times a day (QID) | ORAL | 0 refills | Status: DC | PRN

## 2016-08-29 NOTE — Telephone Encounter (Signed)
Wife advised.  Rx left at front desk for pick up.  

## 2016-08-29 NOTE — Telephone Encounter (Signed)
Printed.  Thanks.  

## 2016-08-29 NOTE — Telephone Encounter (Signed)
Pts wife called to request a refill of pts Oxycodone.  Can you please contact her about this request.

## 2016-09-02 ENCOUNTER — Telehealth: Payer: Self-pay

## 2016-09-02 ENCOUNTER — Telehealth (INDEPENDENT_AMBULATORY_CARE_PROVIDER_SITE_OTHER): Payer: Self-pay

## 2016-09-02 ENCOUNTER — Ambulatory Visit (INDEPENDENT_AMBULATORY_CARE_PROVIDER_SITE_OTHER): Payer: Self-pay

## 2016-09-02 DIAGNOSIS — I130 Hypertensive heart and chronic kidney disease with heart failure and stage 1 through stage 4 chronic kidney disease, or unspecified chronic kidney disease: Secondary | ICD-10-CM | POA: Diagnosis not present

## 2016-09-02 DIAGNOSIS — N183 Chronic kidney disease, stage 3 (moderate): Secondary | ICD-10-CM | POA: Diagnosis not present

## 2016-09-02 DIAGNOSIS — E1151 Type 2 diabetes mellitus with diabetic peripheral angiopathy without gangrene: Secondary | ICD-10-CM | POA: Diagnosis not present

## 2016-09-02 DIAGNOSIS — N400 Enlarged prostate without lower urinary tract symptoms: Secondary | ICD-10-CM | POA: Diagnosis not present

## 2016-09-02 DIAGNOSIS — J449 Chronic obstructive pulmonary disease, unspecified: Secondary | ICD-10-CM | POA: Diagnosis not present

## 2016-09-02 DIAGNOSIS — I2510 Atherosclerotic heart disease of native coronary artery without angina pectoris: Secondary | ICD-10-CM | POA: Diagnosis not present

## 2016-09-02 DIAGNOSIS — M545 Low back pain: Secondary | ICD-10-CM | POA: Diagnosis not present

## 2016-09-02 DIAGNOSIS — I4891 Unspecified atrial fibrillation: Secondary | ICD-10-CM | POA: Diagnosis not present

## 2016-09-02 DIAGNOSIS — G8929 Other chronic pain: Secondary | ICD-10-CM | POA: Diagnosis not present

## 2016-09-02 DIAGNOSIS — M79662 Pain in left lower leg: Secondary | ICD-10-CM | POA: Diagnosis not present

## 2016-09-02 DIAGNOSIS — K5790 Diverticulosis of intestine, part unspecified, without perforation or abscess without bleeding: Secondary | ICD-10-CM | POA: Diagnosis not present

## 2016-09-02 DIAGNOSIS — I5032 Chronic diastolic (congestive) heart failure: Secondary | ICD-10-CM | POA: Diagnosis not present

## 2016-09-02 DIAGNOSIS — E1122 Type 2 diabetes mellitus with diabetic chronic kidney disease: Secondary | ICD-10-CM | POA: Diagnosis not present

## 2016-09-02 NOTE — Telephone Encounter (Signed)
Wes  physical thrapist from [facility]  [phone] Is calling to extend pt's PT for twice a week for 5 weeks.

## 2016-09-02 NOTE — Telephone Encounter (Signed)
We are needing ok for pt to d/c Plavix prior to [facility] with Dr. Ernestina Patches

## 2016-09-03 NOTE — Telephone Encounter (Signed)
Wes with [facility] advised.

## 2016-09-03 NOTE — Telephone Encounter (Signed)
s/w [name] and scheduled pt for 09/09/16 @ 8:15 w/driver and knows to stop taking Plavix on 09/04/16

## 2016-09-03 NOTE — Telephone Encounter (Signed)
Please give the order.  Thanks.   

## 2016-09-03 NOTE — Telephone Encounter (Signed)
Okay to hold for up to 5 days prior to procedure. I need Dr. Ernestina Patches to give input on the time to restart medication after procedure. Thanks.

## 2016-09-03 NOTE — Telephone Encounter (Signed)
Message routed to [employer], [place].

## 2016-09-04 DIAGNOSIS — I130 Hypertensive heart and chronic kidney disease with heart failure and stage 1 through stage 4 chronic kidney disease, or unspecified chronic kidney disease: Secondary | ICD-10-CM

## 2016-09-04 DIAGNOSIS — G8929 Other chronic pain: Secondary | ICD-10-CM | POA: Diagnosis not present

## 2016-09-04 DIAGNOSIS — M79662 Pain in left lower leg: Secondary | ICD-10-CM | POA: Diagnosis not present

## 2016-09-04 DIAGNOSIS — M545 Low back pain: Secondary | ICD-10-CM | POA: Diagnosis not present

## 2016-09-04 DIAGNOSIS — I5032 Chronic diastolic (congestive) heart failure: Secondary | ICD-10-CM

## 2016-09-04 DIAGNOSIS — E1122 Type 2 diabetes mellitus with diabetic chronic kidney disease: Secondary | ICD-10-CM | POA: Diagnosis not present

## 2016-09-06 ENCOUNTER — Other Ambulatory Visit: Payer: Self-pay | Admitting: Family Medicine

## 2016-09-06 DIAGNOSIS — M79662 Pain in left lower leg: Secondary | ICD-10-CM | POA: Diagnosis not present

## 2016-09-06 DIAGNOSIS — I4891 Unspecified atrial fibrillation: Secondary | ICD-10-CM | POA: Diagnosis not present

## 2016-09-06 DIAGNOSIS — E1122 Type 2 diabetes mellitus with diabetic chronic kidney disease: Secondary | ICD-10-CM | POA: Diagnosis not present

## 2016-09-06 DIAGNOSIS — I5032 Chronic diastolic (congestive) heart failure: Secondary | ICD-10-CM | POA: Diagnosis not present

## 2016-09-06 DIAGNOSIS — N400 Enlarged prostate without lower urinary tract symptoms: Secondary | ICD-10-CM | POA: Diagnosis not present

## 2016-09-06 DIAGNOSIS — N183 Chronic kidney disease, stage 3 (moderate): Secondary | ICD-10-CM | POA: Diagnosis not present

## 2016-09-06 DIAGNOSIS — K5790 Diverticulosis of intestine, part unspecified, without perforation or abscess without bleeding: Secondary | ICD-10-CM | POA: Diagnosis not present

## 2016-09-06 DIAGNOSIS — J449 Chronic obstructive pulmonary disease, unspecified: Secondary | ICD-10-CM | POA: Diagnosis not present

## 2016-09-06 DIAGNOSIS — G8929 Other chronic pain: Secondary | ICD-10-CM | POA: Diagnosis not present

## 2016-09-06 DIAGNOSIS — I130 Hypertensive heart and chronic kidney disease with heart failure and stage 1 through stage 4 chronic kidney disease, or unspecified chronic kidney disease: Secondary | ICD-10-CM | POA: Diagnosis not present

## 2016-09-06 DIAGNOSIS — E1151 Type 2 diabetes mellitus with diabetic peripheral angiopathy without gangrene: Secondary | ICD-10-CM | POA: Diagnosis not present

## 2016-09-06 DIAGNOSIS — I2510 Atherosclerotic heart disease of native coronary artery without angina pectoris: Secondary | ICD-10-CM | POA: Diagnosis not present

## 2016-09-06 DIAGNOSIS — M545 Low back pain: Secondary | ICD-10-CM | POA: Diagnosis not present

## 2016-09-06 MED ORDER — OXYCODONE HCL 7.5 MG PO TABS: 7.5000 mg | ORAL_TABLET | Freq: Four times a day (QID) | ORAL | 0 refills | Status: DC | PRN

## 2016-09-06 NOTE — Telephone Encounter (Signed)
Printed.  Thanks.  

## 2016-09-06 NOTE — Telephone Encounter (Signed)
Electronic refill request. Last Filled:    40 tablet 0 08/29/2016  Please advise.

## 2016-09-06 NOTE — Telephone Encounter (Signed)
Wife advised.  Rx left at front desk for pick up.  

## 2016-09-09 ENCOUNTER — Ambulatory Visit (INDEPENDENT_AMBULATORY_CARE_PROVIDER_SITE_OTHER): Payer: Medicare Other | Admitting: Physical Medicine and Rehabilitation

## 2016-09-09 ENCOUNTER — Encounter (INDEPENDENT_AMBULATORY_CARE_PROVIDER_SITE_OTHER): Payer: Self-pay | Admitting: Physical Medicine and Rehabilitation

## 2016-09-09 VITALS — BP 118/61 | HR 78

## 2016-09-09 DIAGNOSIS — M5416 Radiculopathy, lumbar region: Secondary | ICD-10-CM

## 2016-09-09 DIAGNOSIS — I509 Heart failure, unspecified: Secondary | ICD-10-CM | POA: Diagnosis not present

## 2016-09-09 MED ORDER — LIDOCAINE HCL (PF) 1 % IJ SOLN
0.3300 mL | Freq: Once | INTRAMUSCULAR | Status: AC
  Administered 2016-09-09: 0.3 mL

## 2016-09-09 MED ORDER — METHYLPREDNISOLONE ACETATE 80 MG/ML IJ SUSP
80.0000 mg | Freq: Once | INTRAMUSCULAR | Status: AC
  Administered 2016-09-09: 80 mg

## 2016-09-09 NOTE — Progress Notes (Signed)
Office Visit Note  Patient: [name] Small           Date of Birth: 22-Jan-1928           MRN: [id number] Visit Date: 09/09/2016              Requested by: Tonia Ghent, MD [place], [address] [address] PCP: [name], MD   Assessment & Plan: Visit Diagnoses:  1. Lumbar radiculopathy     Follow-Up Instructions: Return if symptoms worsen or fail to improve, for potential left L4/5 facet joint injection and aspiration.  Orders:  Orders Placed This Encounter  Procedures  . Epidural Steroid injection    Meds ordered this encounter  Medications  . lidocaine (PF) (XYLOCAINE) 1 % injection 0.3 mL  . methylPREDNISolone acetate (DEPO-MEDROL) injection 80 mg      Procedures: Lumbosacral Transforaminal Epidural Steroid Injection - Infraneural Approach with Fluoroscopic Guidance  Patient: [name] Small      Date of Birth: March 05, 1928 MRN: [id number] PCP: [name], MD      Visit Date: 09/09/2016   Universal Protocol:    Date/Time: 10/30/175:40 AM  Consent Given By: the patient  Position: PRONE   Additional Comments: Vital signs were monitored before and after the procedure. Patient was prepped and draped in the usual sterile fashion. The correct patient, procedure, and site was verified.   Injection Procedure Details:  Procedure Site One Meds Administered:  Meds ordered this encounter  Medications  . lidocaine (PF) (XYLOCAINE) 1 % injection 0.3 mL  . methylPREDNISolone acetate (DEPO-MEDROL) injection 80 mg     Laterality: Bilateral We actually did left side only  Location/Site:  L5-S1  Needle size: 22 G  Needle type: Spinal  Needle Placement: Transforaminal  Findings:  -Contrast Used: 2 mL iohexol 180 mg iodine/mL   -Comments: Excellent flow of contrast along the nerve and into the epidural space.  Procedure Details: After squaring off the end-plates of the desired vertebral level to get a true AP view, the C-arm was  obliqued to the painful side so that the superior articulating process is positioned about 1/3 the length of the inferior endplate.  The needle was aimed toward the junction of the superior articular process and the transverse process of the inferior vertebrae. The needle's initial entry is in the lower third of the foramen through Kambin's triangle. The soft tissues overlying this target were infiltrated with 2-3 ml. of 1% Lidocaine without Epinephrine.  The spinal needle was then inserted and advanced toward the target using a "trajectory" view along the fluoroscope beam.  Under AP and lateral visualization, the needle was advanced so it did not puncture dura and did not traverse medially beyond the 6 o'clock position of the pedicle. Bi-planar projections were used to confirm position. Aspiration was confirmed to be negative for CSF and/or blood. A 1-2 ml. volume of Isovue-250 was injected and flow of contrast was noted at each level. Radiographs were obtained for documentation purposes.   After attaining the desired flow of contrast documented above, a 0.5 to 1.0 ml test dose of 0.25% Marcaine was injected into each respective transforaminal space.  The patient was observed for 90 seconds post injection.  After no sensory deficits were reported, and normal lower extremity motor function was noted,   the above injectate was administered so that equal amounts of the injectate were placed at each foramen (level) into the transforaminal epidural space.   Additional Comments:  The  patient tolerated the procedure well Dressing: Band-Aid    Post-procedure details: Patient was observed during the procedure. Post-procedure instructions were reviewed.  Patient left the clinic in stable condition.   Other Procedures: No procedures performed   Clinical Data: Findings:  08/28/2016 IMPRESSION: 1. L4-5 advanced facet arthropathy with signs of chronic motion. Moderate canal stenosis with left more  than right subarticular recess stenosis due to synovial cysts/ganglia. The left L5 nerve is directly compressed by the left-sided cyst. Moderate foraminal narrowing, greater on the right due to disc protrusion. 2. Noncompressive degenerative changes described above. 3. 23 mm cystic appearing retroperitoneal mass at the level of L2. This has a benign appearance most suggestive of lymphocele, but was not seen on 2012 abdominal CT. Correlate for any interval retroperitoneal intervention and consider 6-12 month follow-up abdominal CT in this patient with history of bladder cancer.    Subjective: Chief Complaint  Patient presents with  . Lower Back - Pain    Back Pain  The current episode started more than 1 month ago. The problem occurs constantly. The pain is present in the lumbar spine. The pain radiates to the left knee, left thigh and left foot. The pain is at a severity of 10/10. Pertinent negatives include no abdominal pain, chest pain, fever or headaches.   Fell. Lower back pain radiating down left leg. Difficulty walking. Seeps in a lift chair. Has had an MRI and is doing PT. Has a driver and has not taken Plavix x 5 days.He is in a wheelchair today but can ambulate but because of the pain to stand and walk. His pain is on the left side in a classic L5 distribution. He's had no focal weakness but does feel some weakness overall. He is in excruciating amount of pain and nothing is working at this point. He has had pain medications and anti-inflammatories without any relief. He's had no prior back surgeries. MRI is as reviewed and findings.  Review of Systems  Constitutional: Negative for chills, fatigue, fever and unexpected weight change.  HENT: Negative for sore throat and trouble swallowing.   Eyes: Negative for photophobia and visual disturbance.  Respiratory: Negative for chest tightness and shortness of breath.   Cardiovascular: Negative for chest pain.  Gastrointestinal:  Negative for abdominal pain.  Endocrine: Negative for cold intolerance and heat intolerance.  Musculoskeletal: Positive for back pain. Negative for myalgias.  Skin: Negative for color change and rash.  Neurological: Negative for speech difficulty and headaches.  Psychiatric/Behavioral: Negative for confusion. The patient is not nervous/anxious.      Objective: Vital Signs: BP 118/61   Pulse 78    Physical Exam  Constitutional: He appears well-developed and well-nourished. No distress.  Eyes: Conjunctivae are normal. Pupils are equal, round, and reactive to light.  Cardiovascular: Regular rhythm and intact distal pulses.   Pulmonary/Chest: Effort normal and breath sounds normal.  Skin: Skin is warm.  Psychiatric: He has a normal mood and affect.    Ortho Exam The patient is a wheelchair today. He stands with difficulty. He has pain in an L5 distribution with some decreased sensation to light touch. He has no clonus bilaterally with good distal strength.  Specialty Comments:  No specialty comments available. Imaging: No results found.   PMFS History: Patient Active Problem List   Diagnosis Date Noted  . Fall 07/04/2016  . Osteomyelitis of left foot ([place])   . Acute renal failure superimposed on stage 3 chronic kidney disease ([place])   . Osteomyelitis (  [place]) 06/10/2016  . COPD (chronic obstructive pulmonary disease) ([place]) 06/10/2016  . Right second toe ulcer ([place]) 05/20/2016  . Diabetic ulcer of left foot ([place]) 03/24/2016  . Diabetic ulcer of left foot associated with diabetes mellitus due to underlying condition ([place])   . Weakness 03/18/2016  . Mood disorder ([place]) 03/18/2016  . Groin hematoma   . Chronic renal failure   . Critical lower limb ischemia 02/28/2016  . Peripheral arterial disease ([place]) 10/17/2015  . Edema 07/19/2015  . Claudication ([place]) 01/31/2015  . DOE (dyspnea on exertion) 01/31/2015  . Hyperlipidemia 01/31/2015  . HTN (hypertension) 01/31/2015  .  Chronic diastolic CHF (congestive heart failure) ([place]) 01/21/2014  . Seborrheic keratoses 04/30/2013  . CHF (congestive heart failure) ([place]) 12/16/2012  . Atrial fibrillation ([place]) 12/16/2012  . Hyperkalemia 11/27/2011  . Bladder cancer ([place]) 09/22/2011  . Leg cramps 08/28/2011  . Cough 05/26/2011  . CAD (coronary artery disease) of artery bypass graft 05/09/2011  . Dyspnea on exertion 04/19/2011  . PSORIASIS, SCALP 10/25/2008  . Chronic kidney disease, stage III (moderate) 12/04/2007  . Diabetes with neurologic complications ([place]) 75/64/3329  . DIVERTICULOSIS, COLON 05/21/2007  . BPH (benign prostatic hyperplasia) 05/21/2007   Past Medical History:  Diagnosis Date  . Arthritis   . Back pain    s/p lumbar injection 2014  . BENIGN PROSTATIC HYPERTROPHY 05/21/2007  . Bladder cancer ([place]) 10/2011  . CAD (coronary artery disease) CARDIOLOGIST- DR WALL - VISIT IN JUNE 2012 FOR CATH   nonobstructive by cath 6/12:  mid LAD 30%, proximal obtuse marginal-2 30%, proximal RCA 20%, mid RCA 30-40%.  He had normal cardiac output and mildly elevated filling pressures but no significant pulmonary hypertension;   Echocardiogram in May 2012 demonstrated EF 50-55% and left atrial enlargement   . Cellulitis of left foot    Hospitalized in 2006  . CHF (congestive heart failure) ([place])    per family, issues in 2012  . Chronic kidney disease (CKD), stage III (moderate) 12/04/2007   FOLLOWED BY DR PATEL  . Chronic pain of lower extremity   . DIABETES MELLITUS, TYPE II 05/21/2007  . DIVERTICULOSIS, COLON 05/21/2007   pt denies  . Fatigue AGE-RELATED  . Gross hematuria    pt denies  . HYPERTENSION 05/21/2007  . Impaired hearing BILATERAL HEARING AIDS  . On home oxygen therapy    "2L at nighttime" (02/28/2016)  . PAD (peripheral artery disease) ([place])   . Psoriasis ELBOWS  . PSORIASIS, SCALP 10/25/2008    Family History  Problem Relation Age of Onset  . Diabetes Mother   . Heart disease Brother   .  Heart disease Brother   . Heart disease Brother   . Heart disease Brother   . Heart disease Brother   . Heart disease Brother   . Lung cancer Brother    Past Surgical History:  Procedure Laterality Date  . AMPUTATION Left 03/27/2016   Procedure: partial first AMPUTATION RAY; LEFT;  Surgeon: Trula Slade, DPM;  Location: [facility];  Service: Podiatry;  Laterality: Left;  . AMPUTATION Left 06/12/2016   Procedure: TRANSMETATARSAL AMPUTATION LEFT FOOT;  Surgeon: Newt Minion, MD;  Location: [facility];  Service: Orthopedics;  Laterality: Left;  . APPENDECTOMY  1941  . CARDIAC CATHETERIZATION  04-24-11/  DR Rogue Jury ARIDA   MILD NONOBSTRUCTIVE CAD, NORMA CARDIAC OUTPUT  . CARDIOVASCULAR STRESS TEST  2007  . CATARACT EXTRACTION W/ INTRAOCULAR LENS  IMPLANT, BILATERAL Bilateral ~ 2010  . CYSTOSCOPY  12/25/2011  Procedure: CYSTOSCOPY;  Surgeon: Molli Hazard, MD;  Location: [facility];  Service: Urology;  Laterality: N/A;  needs intubation and to be paralyzed   . CYSTOSCOPY W/ RETROGRADES  10/30/2011   Procedure: CYSTOSCOPY WITH RETROGRADE PYELOGRAM;  Surgeon: Molli Hazard, MD;  Location: [facility];  Service: Urology;  Laterality: Bilateral;  CYSTOSCOPY POSS TURBT BILATERAL RETROGRADE PYLEOGRAM   . CYSTOSCOPY W/ RETROGRADES  11/30/2012   Procedure: CYSTOSCOPY WITH RETROGRADE PYELOGRAM;  Surgeon: Molli Hazard, MD;  [facility];  Service: Urology;  Laterality: Bilateral;  Flexible cystoscopy.  . CYSTOSCOPY WITH BIOPSY  11/30/2012   Procedure: CYSTOSCOPY WITH BIOPSY;  Surgeon: Molli Hazard, MD;  [facility];  Service: Urology;  Laterality: N/A;  . INCISION AND DRAINAGE FOOT Left ~ 2005   LEFT FOOT DUE TO INFECTION FROM  NAIL PUNCTURE INJURY  . PENILE PROSTHESIS IMPLANT  1990s  . PERIPHERAL VASCULAR CATHETERIZATION N/A 02/14/2016   Procedure: Abdominal Aortogram w/Lower Extremity;  Surgeon:  Wellington Hampshire, MD;  Location: [facility];  Service: Cardiovascular;  Laterality: N/A;  . PERIPHERAL VASCULAR CATHETERIZATION Left 02/28/2016   Procedure: Peripheral Vascular Balloon Angioplasty;  Surgeon: Wellington Hampshire, MD;  Location: [facility];  Service: Cardiovascular;  Laterality: Left;  left peroneal and popliteal artery  . [place]  . TRANSURETHRAL RESECTION OF BLADDER TUMOR  10/30/2011   Procedure: TRANSURETHRAL RESECTION OF BLADDER TUMOR (TURBT);  Surgeon: Molli Hazard, MD;  Location: [facility];  Service: Urology;  Laterality: N/A;  . TRANSURETHRAL RESECTION OF BLADDER TUMOR  12/25/2011   Procedure: TRANSURETHRAL RESECTION OF BLADDER TUMOR (TURBT);  Surgeon: Molli Hazard, MD;  Location: [facility];  Service: Urology;  Laterality: N/A;  need long gyrus instruments   . VASECTOMY  1990s   Social History   Occupational History  . retired      [employer]	[place].   Social History Main Topics  . Smoking status: Former Smoker    Packs/day: 2.00    Years: 22.00    Types: Cigarettes    Quit date: 11/11/1968  . Smokeless tobacco: Never Used  . Alcohol use 1.2 oz/week    2 Glasses of wine per week     Comment: occassional  . Drug use: No  . Sexual activity: Not Currently

## 2016-09-09 NOTE — Procedures (Signed)
Lumbosacral Transforaminal Epidural Steroid Injection - Infraneural Approach with Fluoroscopic Guidance  Patient: [name] Small      Date of Birth: [birth year] MRN: [id number] PCP: [name], MD      Visit Date: 09/09/2016   Universal Protocol:    Date/Time: 10/30/175:40 AM  Consent Given By: the patient  Position: PRONE   Additional Comments: Vital signs were monitored before and after the procedure. Patient was prepped and draped in the usual sterile fashion. The correct patient, procedure, and site was verified.   Injection Procedure Details:  Procedure Site One Meds Administered:  Meds ordered this encounter  Medications  . lidocaine (PF) (XYLOCAINE) 1 % injection 0.3 mL  . methylPREDNISolone acetate (DEPO-MEDROL) injection 80 mg     Laterality: Bilateral We actually did left side only  Location/Site:  L5-S1  Needle size: 22 G  Needle type: Spinal  Needle Placement: Transforaminal  Findings:  -Contrast Used: 2 mL iohexol 180 mg iodine/mL   -Comments: Excellent flow of contrast along the nerve and into the epidural space.  Procedure Details: After squaring off the end-plates of the desired vertebral level to get a true AP view, the C-arm was obliqued to the painful side so that the superior articulating process is positioned about 1/3 the length of the inferior endplate.  The needle was aimed toward the junction of the superior articular process and the transverse process of the inferior vertebrae. The needle's initial entry is in the lower third of the foramen through Kambin's triangle. The soft tissues overlying this target were infiltrated with 2-3 ml. of 1% Lidocaine without Epinephrine.  The spinal needle was then inserted and advanced toward the target using a "trajectory" view along the fluoroscope beam.  Under AP and lateral visualization, the needle was advanced so it did not puncture dura and did not traverse medially beyond the 6 o'clock position of  the pedicle. Bi-planar projections were used to confirm position. Aspiration was confirmed to be negative for CSF and/or blood. A 1-2 ml. volume of Isovue-250 was injected and flow of contrast was noted at each level. Radiographs were obtained for documentation purposes.   After attaining the desired flow of contrast documented above, a 0.5 to 1.0 ml test dose of 0.25% Marcaine was injected into each respective transforaminal space.  The patient was observed for 90 seconds post injection.  After no sensory deficits were reported, and normal lower extremity motor function was noted,   the above injectate was administered so that equal amounts of the injectate were placed at each foramen (level) into the transforaminal epidural space.   Additional Comments:  The patient tolerated the procedure well Dressing: Band-Aid    Post-procedure details: Patient was observed during the procedure. Post-procedure instructions were reviewed.  Patient left the clinic in stable condition.

## 2016-09-12 DIAGNOSIS — J449 Chronic obstructive pulmonary disease, unspecified: Secondary | ICD-10-CM | POA: Diagnosis not present

## 2016-09-15 ENCOUNTER — Other Ambulatory Visit: Payer: Self-pay | Admitting: Family Medicine

## 2016-09-15 DIAGNOSIS — E1140 Type 2 diabetes mellitus with diabetic neuropathy, unspecified: Secondary | ICD-10-CM

## 2016-09-16 MED ORDER — OXYCODONE HCL 7.5 MG PO TABS: 7.5000 mg | ORAL_TABLET | Freq: Four times a day (QID) | ORAL | 0 refills | Status: DC | PRN

## 2016-09-16 NOTE — Telephone Encounter (Signed)
Printed.  Thanks.  

## 2016-09-16 NOTE — Telephone Encounter (Signed)
MyChart refill request.  Last Filled:    40 tablet 0 09/06/2016  Please advise.

## 2016-09-16 NOTE — Telephone Encounter (Signed)
Patient advised.  Rx left at front desk for pick up. 

## 2016-09-17 ENCOUNTER — Telehealth (INDEPENDENT_AMBULATORY_CARE_PROVIDER_SITE_OTHER): Payer: Self-pay | Admitting: Physical Medicine and Rehabilitation

## 2016-09-17 NOTE — Telephone Encounter (Signed)
Still give it a few more days but you can schedule ov in case anyway.

## 2016-09-17 NOTE — Telephone Encounter (Signed)
Scheduled for an office visit 09/25/16 at 1000.

## 2016-09-17 NOTE — Telephone Encounter (Signed)
Scheduled for an office visit.

## 2016-09-19 ENCOUNTER — Telehealth: Payer: Self-pay | Admitting: Radiology

## 2016-09-19 ENCOUNTER — Other Ambulatory Visit (INDEPENDENT_AMBULATORY_CARE_PROVIDER_SITE_OTHER): Payer: Medicare Other

## 2016-09-19 ENCOUNTER — Other Ambulatory Visit: Payer: Medicare Other

## 2016-09-19 DIAGNOSIS — E1140 Type 2 diabetes mellitus with diabetic neuropathy, unspecified: Secondary | ICD-10-CM | POA: Diagnosis not present

## 2016-09-19 LAB — BASIC METABOLIC PANEL
BUN: 34 mg/dL — ABNORMAL HIGH (ref 6–23)
CO2: 34 mEq/L — ABNORMAL HIGH (ref 19–32)
Calcium: 9.5 mg/dL (ref 8.4–10.5)
Chloride: 96 mEq/L (ref 96–112)
Creatinine, Ser: 1.91 mg/dL — ABNORMAL HIGH (ref 0.40–1.50)
GFR: 35.7 mL/min — ABNORMAL LOW (ref >60.00)
Glucose, Bld: 201 mg/dL — ABNORMAL HIGH (ref 70–99)
Potassium: 4.5 mEq/L (ref 3.5–5.1)
Sodium: 138 mEq/L (ref 135–145)

## 2016-09-19 LAB — HEMOGLOBIN A1C: Hgb A1c MFr Bld: 10.7 % — ABNORMAL HIGH (ref 4.6–6.5)
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
## 2016-09-20 NOTE — Telephone Encounter (Signed)
Noted.  I offered to see patient as a work in.

## 2016-09-24 ENCOUNTER — Encounter: Payer: Self-pay | Admitting: Family Medicine

## 2016-09-24 ENCOUNTER — Ambulatory Visit (INDEPENDENT_AMBULATORY_CARE_PROVIDER_SITE_OTHER): Payer: Medicare Other | Admitting: Family Medicine

## 2016-09-24 DIAGNOSIS — E1140 Type 2 diabetes mellitus with diabetic neuropathy, unspecified: Secondary | ICD-10-CM | POA: Diagnosis not present

## 2016-09-24 DIAGNOSIS — J449 Chronic obstructive pulmonary disease, unspecified: Secondary | ICD-10-CM | POA: Diagnosis not present

## 2016-09-24 DIAGNOSIS — C679 Malignant neoplasm of bladder, unspecified: Secondary | ICD-10-CM

## 2016-09-24 DIAGNOSIS — L98491 Non-pressure chronic ulcer of skin of other sites limited to breakdown of skin: Secondary | ICD-10-CM | POA: Diagnosis not present

## 2016-09-24 DIAGNOSIS — M48061 Spinal stenosis, lumbar region without neurogenic claudication: Secondary | ICD-10-CM

## 2016-09-24 MED ORDER — OXYCODONE HCL 7.5 MG PO TABS: 7.5000 mg | ORAL_TABLET | Freq: Four times a day (QID) | ORAL | 0 refills | Status: DC | PRN

## 2016-09-24 NOTE — Patient Instructions (Addendum)
Fill out the sugar grid and update me.  If you only get #20 pills of oxycodone at the pharmacy then have them call me.  Use barrier cream or the hydrocolloid dressings on the raw spots (but not both together).  Update me if not better.  Take care.  Glad to see you.

## 2016-09-24 NOTE — Progress Notes (Signed)
Pre visit review using our clinic review tool, if applicable. No additional management support is needed unless otherwise documented below in the visit note. 

## 2016-09-24 NOTE — Progress Notes (Signed)
Diabetes: not checking sugar.  A1c elevation noted. Taking about 15-16 units of insulin once daily.  Discussed with patient about options.  Status post left distal foot a dictation. The foot has healed well in the meantime.  Gluteal crease irritation. Wife has used multiple creams but not barrier cream on the area. She has tried using a hydrocolloid dressing, without good effect. There trying to shift his weight and have him sit on a doughnut pillow and a vary his posture. He isn't worse, but he is getting better. He does have pain sitting.  History of COPD. Using inhalers at baseline. He has had some more sputum production in the meantime. When he is able to get more sputum out he feels better overall.  Recent MRI ordered by orthopedics discussed with patient. Noted to have 23 mm cystic appearing retroperitoneal mass at the level of L2. This has a benign appearance most suggestive of lymphocele, but was not seen on 2012 abdominal CT. Correlate for any interval retroperitoneal intervention and consider 6-12 month follow-up abdominal CT in this patient with history of bladder cancer.  Report discussed with patient.  PMH and SH reviewed  Meds, vitals, and allergies reviewed.   ROS: Per HPI unless specifically indicated in ROS section   GEN: nad, alert and oriented HEENT: mucous membranes moist NECK: supple w/o LA CV: rrr. PULM: ctab, no inc wob ABD: soft, +bs EXT: no edema SKIN: Gluteal crease with 3 small shallow ulcerated areas. These are superficial and do not extend deeply into the tissue. No spreading erythema.  Diabetic foot exam: Normal inspection except for distal L foot amputation with normal healing.  No skin breakdown No calluses  Dec R DP pulses Nails normal on R foot.

## 2016-09-25 ENCOUNTER — Encounter (INDEPENDENT_AMBULATORY_CARE_PROVIDER_SITE_OTHER): Payer: Self-pay | Admitting: Physical Medicine and Rehabilitation

## 2016-09-25 ENCOUNTER — Telehealth: Payer: Self-pay | Admitting: Family Medicine

## 2016-09-25 ENCOUNTER — Ambulatory Visit (INDEPENDENT_AMBULATORY_CARE_PROVIDER_SITE_OTHER): Payer: Medicare Other | Admitting: Physical Medicine and Rehabilitation

## 2016-09-25 VITALS — BP 114/63 | HR 80

## 2016-09-25 DIAGNOSIS — L98499 Non-pressure chronic ulcer of skin of other sites with unspecified severity: Secondary | ICD-10-CM | POA: Insufficient documentation

## 2016-09-25 DIAGNOSIS — M48061 Spinal stenosis, lumbar region without neurogenic claudication: Secondary | ICD-10-CM | POA: Insufficient documentation

## 2016-09-25 DIAGNOSIS — M5416 Radiculopathy, lumbar region: Secondary | ICD-10-CM

## 2016-09-25 DIAGNOSIS — R531 Weakness: Secondary | ICD-10-CM | POA: Diagnosis not present

## 2016-09-25 DIAGNOSIS — M48062 Spinal stenosis, lumbar region with neurogenic claudication: Secondary | ICD-10-CM

## 2016-09-25 NOTE — Progress Notes (Signed)
[name] Small - 82 y.o. male MRN [id number]  Date of birth: 06-14-1928  Office Visit Note: Visit Date: 09/25/2016 PCP: [name], MD Referred by: Tonia Ghent, MD  Subjective: Chief Complaint  Patient presents with  . Spine - Pain, Numbness, Weakness   HPI: [name] Small is a pleasant 82 year old gentleman who is here in follow-up after epidural injection which was a left L5 transforaminal epidural injection. He is here with his wife and son provided some of the history. He does state that he got a lot of pain relief from the injection but he still having pain from the knee down to the foot and more of an L5 distribution. The pain from his hip to his knee has improved significantly. Now his biggest complaint is weakness. When we saw him before he was in a wheelchair and it was thought that he is having difficulty standing because the pain was so severe at least that was what was reported. We did her physical exam with him we tested distal strength and did watch him stand and did not specifically address any weakness with knee extension. Initial C on the physical exam below he does have weakness with knee extension on the left. Again his symptoms were 10 out of 10 pain now he is really more like a 4 or less out of 10 pain but it's mainly from the lateral knee down to the foot. There is some paresthesia. There is some decreased sensation. He has not any bowel or bladder changes or any other red flag symptoms other than the weakness. No fever chills or night sweats. Symptoms are better if he is having good position. Medications have helped some as well. His case is complicated by chronic Plavix anticoagulation. He is diabetic and has had some complications with that is why I am seeing Dr. Sharol Given. He does have some vascular disease and heart disease.    Injection done 09/09/16 did helped. Came back because he is still having pain in left leg from the knee down and can not lift left leg.  Review of  Systems  Constitutional: Negative for chills, fever, malaise/fatigue and weight loss.  HENT: Negative for hearing loss and sinus pain.   Eyes: Negative for blurred vision, double vision and photophobia.  Respiratory: Negative for cough and shortness of breath.   Cardiovascular: Negative for chest pain, palpitations and leg swelling.  Gastrointestinal: Negative for abdominal pain, nausea and vomiting.  Genitourinary: Negative for flank pain.  Musculoskeletal: Positive for back pain. Negative for myalgias.  Skin: Negative for itching and rash.  Neurological: Positive for focal weakness. Negative for tremors and weakness.  Endo/Heme/Allergies: Negative.   Psychiatric/Behavioral: Negative for depression and suicidal ideas.  All other systems reviewed and are negative.  Otherwise per HPI.  Assessment & Plan: Visit Diagnoses:  1. Spinal stenosis of lumbar region with neurogenic claudication   2. Lumbar radiculopathy   3. Weakness     Plan: Findings:  Chronic history over the years of some back pain but with a more recent onset of left radicular pain or an L5 distribution in terms of his pattern of pain and paresthesia that he indicates. He has weakness though with knee extension. He has actually fairly good hip flexion and descent dorsiflexion plantarflexion EHL. His MRI shows multifactorial stenosis at L4-5 without listhesis but there is a left-sided facet joint cyst that is impacting the foramen and lateral recess. The L5 injection didn't give him a lot of relief at least  from the hip down to the knee. I think given his weakness and assistance really impacting that one spot I would like to send him to Dr. Louanne Skye for evaluation to see if he would be a surgical candidate. He is 82 with some heart disease and diabetes but I think it would be fair to look at that. I'm also going to try to complete a left-sided L4-5 facet joint injection and aspiration to see if that may help. He is going to need some  rehabilitation to strengthen the leg. Right now there does have a hard time getting him out and around because of his inability to ambulate. He can stay on the Plavix anticoagulation for the facet joint injection aspiration. I spent more than 25 minutes speaking face-to-face with the patient with 50% of the time in counseling.    Meds & Orders: No orders of the defined types were placed in this encounter.  No orders of the defined types were placed in this encounter.   Follow-up: Return for Schedule L4-5 left joint injection, and consult Dr. Louanne Skye.   Procedures: No procedures performed  No notes on file   Clinical History: No specialty comments available.  He reports that he quit smoking about 47 years ago. His smoking use included Cigarettes. He has a 44.00 pack-year smoking history. He has never used smokeless tobacco.   Recent Labs  02/19/16 1403 06/10/16 1149 09/19/16 1245  HGBA1C 9.2* 9.3* 10.7*    Objective:  VS:  HT:    WT:   BMI:     BP:114/63  HR:80bpm  TEMP: ( )  RESP:  Physical Exam  Constitutional: He is oriented to person, place, and time. He appears well-developed and well-nourished. No distress.  HENT:  Head: Normocephalic and atraumatic.  Nose: Nose normal.  Mouth/Throat: Oropharynx is clear and moist.  Eyes: Conjunctivae are normal. Pupils are equal, round, and reactive to light.  Neck: Normal range of motion. Neck supple.  Cardiovascular: Regular rhythm and intact distal pulses.   Pulmonary/Chest: Effort normal and breath sounds normal.  Abdominal: Soft.  Musculoskeletal:  The patient is seated in a wheelchair he can stand but is pretty unsteady there is weakness with knee extension. He asked he has decent strength with hip flexion and EHL and dorsiflexion plantarflexion. He has no pain over the greater trochanter.  Neurological: He is alert and oriented to person, place, and time. A sensory deficit is present.  Patient does have decreased sensation to  light touch in more of an L5 dermatome on the left. He has no clonus bilaterally. He does have weakness with knee extension which would go along with an L4 or radiculopathy.  Skin: Skin is warm.  Psychiatric: He has a normal mood and affect.  Nursing note and vitals reviewed.   Ortho Exam Imaging: No results found.  Past Medical/Family/Surgical/Social History: Medications & Allergies reviewed per EMR Patient Active Problem List   Diagnosis Date Noted  . Skin ulcer ([place]) 09/25/2016  . Spinal stenosis of lumbar region 09/25/2016  . Fall 07/04/2016  . Osteomyelitis of left foot ([place])   . Acute renal failure superimposed on stage 3 chronic kidney disease ([place])   . Osteomyelitis ([place]) 06/10/2016  . COPD (chronic obstructive pulmonary disease) ([place]) 06/10/2016  . Right second toe ulcer ([place]) 05/20/2016  . Diabetic ulcer of left foot ([place]) 03/24/2016  . Diabetic ulcer of left foot associated with diabetes mellitus due to underlying condition ([place])   . Weakness 03/18/2016  . Mood  disorder ([place]) 03/18/2016  . Groin hematoma   . Chronic renal failure   . Critical lower limb ischemia 02/28/2016  . Peripheral arterial disease ([place]) 10/17/2015  . Edema 07/19/2015  . Claudication ([place]) 01/31/2015  . DOE (dyspnea on exertion) 01/31/2015  . Hyperlipidemia 01/31/2015  . HTN (hypertension) 01/31/2015  . Chronic diastolic CHF (congestive heart failure) ([place]) 01/21/2014  . Seborrheic keratoses 04/30/2013  . CHF (congestive heart failure) ([place]) 12/16/2012  . Atrial fibrillation ([place]) 12/16/2012  . Hyperkalemia 11/27/2011  . Bladder cancer ([place]) 09/22/2011  . Leg cramps 08/28/2011  . Cough 05/26/2011  . CAD (coronary artery disease) of artery bypass graft 05/09/2011  . Dyspnea on exertion 04/19/2011  . PSORIASIS, SCALP 10/25/2008  . Chronic kidney disease, stage III (moderate) 12/04/2007  . Diabetes with neurologic complications ([place]) 35/57/3220  . DIVERTICULOSIS, COLON 05/21/2007  . BPH  (benign prostatic hyperplasia) 05/21/2007   Past Medical History:  Diagnosis Date  . Arthritis   . Back pain    s/p lumbar injection 2014  . BENIGN PROSTATIC HYPERTROPHY 05/21/2007  . Bladder cancer ([place]) 10/2011  . CAD (coronary artery disease) CARDIOLOGIST- DR WALL - VISIT IN JUNE 2012 FOR CATH   nonobstructive by cath 6/12:  mid LAD 30%, proximal obtuse marginal-2 30%, proximal RCA 20%, mid RCA 30-40%.  He had normal cardiac output and mildly elevated filling pressures but no significant pulmonary hypertension;   Echocardiogram in May 2012 demonstrated EF 50-55% and left atrial enlargement   . Cellulitis of left foot    Hospitalized in 2006  . CHF (congestive heart failure) ([place])    per family, issues in 2012  . Chronic kidney disease (CKD), stage III (moderate) 12/04/2007   FOLLOWED BY DR PATEL  . Chronic pain of lower extremity   . DIABETES MELLITUS, TYPE II 05/21/2007  . DIVERTICULOSIS, COLON 05/21/2007   pt denies  . Fatigue AGE-RELATED  . Gross hematuria    pt denies  . HYPERTENSION 05/21/2007  . Impaired hearing BILATERAL HEARING AIDS  . On home oxygen therapy    "2L at nighttime" (02/28/2016)  . PAD (peripheral artery disease) ([place])   . Psoriasis [place]OWS  . PSORIASIS, SCALP 10/25/2008   Family History  Problem Relation Age of Onset  . Diabetes Mother   . Heart disease Brother   . Heart disease Brother   . Heart disease Brother   . Heart disease Brother   . Heart disease Brother   . Heart disease Brother   . Lung cancer Brother    Past Surgical History:  Procedure Laterality Date  . AMPUTATION Left 03/27/2016   Procedure: partial first AMPUTATION RAY; LEFT;  Surgeon: Trula Slade, DPM;  Location: [place];  Service: Podiatry;  Laterality: Left;  . AMPUTATION Left 06/12/2016   Procedure: TRANSMETATARSAL AMPUTATION LEFT FOOT;  Surgeon: Newt Minion, MD;  Location: [place];  Service: Orthopedics;  Laterality: Left;  . APPENDECTOMY  1941  . CARDIAC CATHETERIZATION   04-24-11/  DR Rogue Jury ARIDA   MILD NONOBSTRUCTIVE CAD, NORMA CARDIAC OUTPUT  . CARDIOVASCULAR STRESS TEST  2007  . CATARACT EXTRACTION W/ INTRAOCULAR LENS  IMPLANT, BILATERAL Bilateral ~ 2010  . CYSTOSCOPY  12/25/2011   Procedure: CYSTOSCOPY;  Surgeon: Molli Hazard, MD;  Location: [facility];  Service: Urology;  Laterality: N/A;  needs intubation and to be paralyzed   . CYSTOSCOPY W/ RETROGRADES  10/30/2011   Procedure: CYSTOSCOPY WITH RETROGRADE PYELOGRAM;  Surgeon: Molli Hazard, MD;  Location: [place];  Service: Urology;  Laterality: Bilateral;  CYSTOSCOPY POSS TURBT BILATERAL RETROGRADE PYLEOGRAM   . CYSTOSCOPY W/ RETROGRADES  11/30/2012   Procedure: CYSTOSCOPY WITH RETROGRADE PYELOGRAM;  Surgeon: Molli Hazard, MD;  Location: [facility];  Service: Urology;  Laterality: Bilateral;  Flexible cystoscopy.  . CYSTOSCOPY WITH BIOPSY  11/30/2012   Procedure: CYSTOSCOPY WITH BIOPSY;  Surgeon: Molli Hazard, MD;  Location: [facility];  Service: Urology;  Laterality: N/A;  . INCISION AND DRAINAGE FOOT Left ~ 2005   LEFT FOOT DUE TO INFECTION FROM  NAIL PUNCTURE INJURY  . PENILE PROSTHESIS IMPLANT  1990s  . PERIPHERAL VASCULAR CATHETERIZATION N/A 02/14/2016   Procedure: Abdominal Aortogram w/Lower Extremity;  Surgeon: Wellington Hampshire, MD;  Location: [facility] [facility];  Service: Cardiovascular;  Laterality: N/A;  . PERIPHERAL VASCULAR CATHETERIZATION Left 02/28/2016   Procedure: Peripheral Vascular Balloon Angioplasty;  Surgeon: Wellington Hampshire, MD;  Location: [place] [facility];  Service: Cardiovascular;  Laterality: Left;  left peroneal and popliteal artery  . [place]  . TRANSURETHRAL RESECTION OF BLADDER TUMOR  10/30/2011   Procedure: TRANSURETHRAL RESECTION OF BLADDER TUMOR (TURBT);  Surgeon: Molli Hazard, MD;  Location: [facility];  Service:  Urology;  Laterality: N/A;  . TRANSURETHRAL RESECTION OF BLADDER TUMOR  12/25/2011   Procedure: TRANSURETHRAL RESECTION OF BLADDER TUMOR (TURBT);  Surgeon: Molli Hazard, MD;  Location: [facility];  Service: Urology;  Laterality: N/A;  need long gyrus instruments   . VASECTOMY  1990s   Social History   Occupational History  . retired      [employer]	[place].   Social History Main Topics  . Smoking status: Former Smoker    Packs/day: 2.00    Years: 22.00    Types: Cigarettes    Quit date: 11/11/1968  . Smokeless tobacco: Never Used  . Alcohol use 1.2 oz/week    2 Glasses of wine per week     Comment: occassional  . Drug use: No  . Sexual activity: Not Currently

## 2016-09-25 NOTE — Telephone Encounter (Signed)
I have faxed his last OV note form our office to Dr Jasmine December at [facility] from within the system

## 2016-09-25 NOTE — Assessment & Plan Note (Signed)
Lungs are clear on exam today. Continue current medications.

## 2016-09-25 NOTE — Assessment & Plan Note (Addendum)
He needs better control of his diabetes. I need him to check his sugar before and after meals and then update me with the data. We'll adjust his medicines at that point. He will likely need mealtime insulin. A1c discussed with patient. I can't really adjust his medicines much at this point since he has not been checking his sugars. Discussed with patient. He agrees. >25 minutes spent in face to face time with patient, >50% spent in counselling or coordination of care.

## 2016-09-25 NOTE — Assessment & Plan Note (Signed)
L4-5 advanced facet arthropathy with signs of chronic motion. Moderate canal stenosis with left more than right subarticular recess stenosis due to synovial cysts/ganglia. The left L5 nerve is directly compressed by the left-sided cyst. Moderate foraminal narrowing, greater on the right due to disc protrusion.  He has pain and relative weakness on the left leg. He has follow-up with orthopedics pending. I've been writing for his oxycodone prescriptions. Per report [facility] was only filling him with 20 pills of oxycodone at a time. This is according to the patient and his wife. The previous prescriptions were written for 40 pills. I rewrote his prescription today and asked him to notify me if they had difficulties or concerns at the pharmacy about filling his medication. He has multiple reasons to be in pain including his lower back pathology this previously documented on MR. He is not having adverse effect on medication.

## 2016-09-25 NOTE — Assessment & Plan Note (Signed)
I still think these can heal. He is barrier cream in the meantime. Needs to continue to work to offload his weight with a doughnut pillow . Still needs better control of his diabetes, see above. All discussed with patient. Okay for outpatient follow-up at this point.

## 2016-09-25 NOTE — Assessment & Plan Note (Signed)
History of bladder cancer noted.Recent MRI ordered by orthopedics discussed with patient. Noted to have 23 mm cystic appearing retroperitoneal mass at the level of L2. This has a benign appearance most suggestive of lymphocele, but was not seen on 2012 abdominal CT. Correlate for any interval retroperitoneal intervention and consider 6-12 month follow-up abdominal CT in this patient with history of bladder cancer.  Report discussed with patient. I will ask for urology input about this.

## 2016-09-25 NOTE — Telephone Encounter (Signed)
Please send copy of last office visit note to [facility]. He has a history of bladder cancer. I need input on his most recent lumbar spine MRI. Noted to have 23 mm cystic appearing retroperitoneal mass at the level of L2. This has a benign appearance most suggestive of lymphocele, but was not seen on 2012 abdominal CT. Correlate for any interval retroperitoneal intervention and consider 6-12 month follow-up abdominal CT in this patient with history of bladder cancer.    I have discussed the report with the patient, but I want urology input about imaging follow-up in 6-12 months.

## 2016-09-26 ENCOUNTER — Encounter (INDEPENDENT_AMBULATORY_CARE_PROVIDER_SITE_OTHER): Payer: Self-pay | Admitting: Physical Medicine and Rehabilitation

## 2016-09-26 ENCOUNTER — Ambulatory Visit (INDEPENDENT_AMBULATORY_CARE_PROVIDER_SITE_OTHER): Payer: Medicare Other | Admitting: Physical Medicine and Rehabilitation

## 2016-09-26 VITALS — BP 118/67 | HR 85

## 2016-09-26 DIAGNOSIS — M47816 Spondylosis without myelopathy or radiculopathy, lumbar region: Secondary | ICD-10-CM

## 2016-09-26 DIAGNOSIS — E119 Type 2 diabetes mellitus without complications: Secondary | ICD-10-CM | POA: Diagnosis not present

## 2016-09-26 MED ORDER — METHYLPREDNISOLONE ACETATE 80 MG/ML IJ SUSP
80.0000 mg | Freq: Once | INTRAMUSCULAR | Status: AC
  Administered 2016-09-26: 80 mg

## 2016-09-26 MED ORDER — LIDOCAINE HCL (PF) 1 % IJ SOLN
0.3300 mL | Freq: Once | INTRAMUSCULAR | Status: AC
  Administered 2016-09-26: 0.3 mL

## 2016-09-26 NOTE — Progress Notes (Signed)
[name] Small - 82 y.o. male MRN [id number]  Date of birth: Nov 06, 1928  Office Visit Note: Visit Date: 09/26/2016 PCP: [name], MD Referred by: Tonia Ghent, MD  Subjective: Chief Complaint  Patient presents with  . Lower Back - Pain   HPI: [name] Small is an 82 year old gentleman with known facet joint cyst and stenosis at L4-5. The cyst is on the left L4-5 facet joint. We completed an epidural injection which gave him quite a bit relief in his radicular complaints except from the knee down. His biggest complaint is still pain but also weakness in the leg. As you'll note on the exam below he actually had different exam weakness today. More weakness with hip flexion and less weakness with knee extension which was seemingly in the opposite yesterday.  Patient here today for planned left facet cyst aspiration. No change in symptoms Dr. Otho Ket assistance and is looking for a way to have him evaluated sooner than January..    ROS Otherwise per HPI.  Assessment & Plan: Visit Diagnoses:  1. Spondylosis without myelopathy or radiculopathy, lumbar region     Plan: Findings:  Chronic but with recent worsening of left hip and leg pain which was clearly radicular pain which was resolved to a decent degree after epidural injection but there was still some pain left over from the knee down. Has a lot of weakness in the left thigh and knee but it has sort of switch to last couple of days at least on my exam. He is having difficulty ambulating. We are trying to get him seen by Dr. Louanne Skye for possible surgical referral. We are now doing facet joint aspiration today to see if that helps. Obviously depending on what Dr. Louanne Skye can do we would look at trying to get him some physical therapy for his left hip and leg. Just of note he does have some wasting in the FDI bilaterally in the hands. Depending on how things are going at least at some point we probably should look at a evaluation of the cervical  spine for any possible stenosis there.    Meds & Orders:  Meds ordered this encounter  Medications  . lidocaine (PF) (XYLOCAINE) 1 % injection 0.3 mL  . methylPREDNISolone acetate (DEPO-MEDROL) injection 80 mg    Orders Placed This Encounter  Procedures  . Nerve Block    Follow-up: Return in about 2 weeks (around 10/10/2016) for Trying  to see Dr. Louanne Skye sooner..   Procedures: No procedures performed  Lumbar Facet Joint Intra-Articular Injection(s) with Fluoroscopic Guidance  Patient: [name] Small      Date of Birth: June 29, 1928 MRN: [id number] PCP: [name], MD      Visit Date: 09/26/2016   Universal Protocol:    Date/Time: 11/16/172:36 PM  Consent Given By: the patient  Position: PRONE   Additional Comments: Vital signs were monitored before and after the procedure. Patient was prepped and draped in the usual sterile fashion. The correct patient, procedure, and site was verified.   Injection Procedure Details:  Procedure Site One Meds Administered:  Meds ordered this encounter  Medications  . lidocaine (PF) (XYLOCAINE) 1 % injection 0.3 mL  . methylPREDNISolone acetate (DEPO-MEDROL) injection 80 mg     Laterality: Left  Location/Site:  L4-L5  Needle size: 22 guage  Needle type: Spinal  Needle Placement: Articular  Findings:  -Contrast Used: 1 mL iohexol 180 mg iodine/mL   -Comments: Excellent flow of contrast producing a partial arthrogram.  Procedure Details: The fluoroscope beam is vertically oriented in AP, and the inferior recess is visualized beneath the lower pole of the inferior apophyseal process, which represents the target point for needle insertion. When direct visualization is difficult the target point is located at the medial projection of the vertebral pedicle. The region overlying each aforementioned target is locally anesthetized with a 1 to 2 ml. volume of 1% Lidocaine without Epinephrine.   The spinal needle was inserted into  each of the above mentioned facet joints using biplanar fluoroscopic guidance. A 0.25 to 0.5 ml. volume of Isovue-250 was injected and a partial facet joint arthrogram was obtained. A single spot film was obtained of the resulting arthrogram.    One to 1.25 ml of the steroid/anesthetic solution was then injected into each of the facet joints noted above.   Additional Comments:  The patient tolerated the procedure well Dressing: Band-Aid    Post-procedure details: Patient was observed during the procedure. Post-procedure instructions were reviewed.  Patient left the clinic in stable condition.        Clinical History: No specialty comments available.  He reports that he quit smoking about 47 years ago. His smoking use included Cigarettes. He has a 44.00 pack-year smoking history. He has never used smokeless tobacco.   Recent Labs  02/19/16 1403 06/10/16 1149 09/19/16 1245  HGBA1C 9.2* 9.3* 10.7*    Objective:  VS:  HT:    WT:   BMI:     BP:118/67  HR:85bpm  TEMP: ( )  RESP:92 % Physical Exam  Neurological:  The patient is again seated in a wheelchair. Interestingly today he has less strength with hip flexion but he actually has decent strength with knee the extension which was the opposite of yesterday. He has good EHL and dorsiflexion plantar flexion. He has good knee flexion.    Ortho Exam Imaging: No results found.  Past Medical/Family/Surgical/Social History: Medications & Allergies reviewed per EMR Patient Active Problem List   Diagnosis Date Noted  . Skin ulcer ([place]) 09/25/2016  . Spinal stenosis of lumbar region 09/25/2016  . Fall 07/04/2016  . Osteomyelitis of left foot ([place])   . Acute renal failure superimposed on stage 3 chronic kidney disease ([place])   . Osteomyelitis ([place]) 06/10/2016  . COPD (chronic obstructive pulmonary disease) ([place]) 06/10/2016  . Right second toe ulcer ([place]) 05/20/2016  . Diabetic ulcer of left foot ([place]) 03/24/2016  . Diabetic  ulcer of left foot associated with diabetes mellitus due to underlying condition ([place])   . Weakness 03/18/2016  . Mood disorder ([place]) 03/18/2016  . Groin hematoma   . Chronic renal failure   . Critical lower limb ischemia 02/28/2016  . Peripheral arterial disease ([place]) 10/17/2015  . Edema 07/19/2015  . Claudication ([place]) 01/31/2015  . DOE (dyspnea on exertion) 01/31/2015  . Hyperlipidemia 01/31/2015  . HTN (hypertension) 01/31/2015  . Chronic diastolic CHF (congestive heart failure) ([place]) 01/21/2014  . Seborrheic keratoses 04/30/2013  . CHF (congestive heart failure) ([place]) 12/16/2012  . Atrial fibrillation ([place]) 12/16/2012  . Hyperkalemia 11/27/2011  . Bladder cancer ([place]) 09/22/2011  . Leg cramps 08/28/2011  . Cough 05/26/2011  . CAD (coronary artery disease) of artery bypass graft 05/09/2011  . Dyspnea on exertion 04/19/2011  . PSORIASIS, SCALP 10/25/2008  . Chronic kidney disease, stage III (moderate) 12/04/2007  . Diabetes with neurologic complications ([place]) 02/58/5277  . DIVERTICULOSIS, COLON 05/21/2007  . BPH (benign prostatic hyperplasia) 05/21/2007   Past Medical History:  Diagnosis Date  .  Arthritis   . Back pain    s/p lumbar injection 2014  . BENIGN PROSTATIC HYPERTROPHY 05/21/2007  . Bladder cancer ([place]) 10/2011  . CAD (coronary artery disease) CARDIOLOGIST- DR WALL - VISIT IN JUNE 2012 FOR CATH   nonobstructive by cath 6/12:  mid LAD 30%, proximal obtuse marginal-2 30%, proximal RCA 20%, mid RCA 30-40%.  He had normal cardiac output and mildly elevated filling pressures but no significant pulmonary hypertension;   Echocardiogram in May 2012 demonstrated EF 50-55% and left atrial enlargement   . Cellulitis of left foot    Hospitalized in 2006  . CHF (congestive heart failure) ([place])    per family, issues in 2012  . Chronic kidney disease (CKD), stage III (moderate) 12/04/2007   FOLLOWED BY DR PATEL  . Chronic pain of lower extremity   . DIABETES MELLITUS, TYPE II  05/21/2007  . DIVERTICULOSIS, COLON 05/21/2007   pt denies  . Fatigue AGE-RELATED  . Gross hematuria    pt denies  . HYPERTENSION 05/21/2007  . Impaired hearing BILATERAL HEARING AIDS  . On home oxygen therapy    "2L at nighttime" (02/28/2016)  . PAD (peripheral artery disease) ([place])   . Psoriasis ELBOWS  . PSORIASIS, SCALP 10/25/2008   Family History  Problem Relation Age of Onset  . Diabetes Mother   . Heart disease Brother   . Heart disease Brother   . Heart disease Brother   . Heart disease Brother   . Heart disease Brother   . Heart disease Brother   . Lung cancer Brother    Past Surgical History:  Procedure Laterality Date  . AMPUTATION Left 03/27/2016   Procedure: partial first AMPUTATION RAY; LEFT;  Surgeon: Trula Slade, DPM;  Location: [facility];  Service: Podiatry;  Laterality: Left;  . AMPUTATION Left 06/12/2016   Procedure: TRANSMETATARSAL AMPUTATION LEFT FOOT;  Surgeon: Newt Minion, MD;  Location: [facility];  Service: Orthopedics;  Laterality: Left;  . APPENDECTOMY  1941  . CARDIAC CATHETERIZATION  04-24-11/  DR Rogue Jury ARIDA   MILD NONOBSTRUCTIVE CAD, NORMA CARDIAC OUTPUT  . CARDIOVASCULAR STRESS TEST  2007  . CATARACT EXTRACTION W/ INTRAOCULAR LENS  IMPLANT, BILATERAL Bilateral ~ 2010  . CYSTOSCOPY  12/25/2011   Procedure: CYSTOSCOPY;  Surgeon: Molli Hazard, MD;  Location[facility];  Service: Urology;  Laterality: N/A;  needs intubation and to be paralyzed   . CYSTOSCOPY W/ RETROGRADES  10/30/2011   Procedure: CYSTOSCOPY WITH RETROGRADE PYELOGRAM;  Surgeon: Molli Hazard, MD;  Location[facility];  Service: Urology;  Laterality: Bilateral;  CYSTOSCOPY POSS TURBT BILATERAL RETROGRADE PYLEOGRAM   . CYSTOSCOPY W/ RETROGRADES  11/30/2012   Procedure: CYSTOSCOPY WITH RETROGRADE PYELOGRAM;  Surgeon: Molli Hazard, MD;  Location: [facility];  Service: Urology;  Laterality: Bilateral;  Flexible  cystoscopy.  . CYSTOSCOPY WITH BIOPSY  11/30/2012   Procedure: CYSTOSCOPY WITH BIOPSY;  Surgeon: Molli Hazard, MD;  Location: [facility];  Service: Urology;  Laterality: N/A;  . INCISION AND DRAINAGE FOOT Left ~ 2005   LEFT FOOT DUE TO INFECTION FROM  NAIL PUNCTURE INJURY  . PENILE PROSTHESIS IMPLANT  1990s  . PERIPHERAL VASCULAR CATHETERIZATION N/A 02/14/2016   Procedure: Abdominal Aortogram w/Lower Extremity;  Surgeon: Wellington Hampshire, MD;  Location: [place];  Service: Cardiovascular;  Laterality: N/A;  . PERIPHERAL VASCULAR CATHETERIZATION Left 02/28/2016   Procedure: Peripheral Vascular Balloon Angioplasty;  Surgeon: Wellington Hampshire,  MD;  Location: [place] [facility];  Service: Cardiovascular;  Laterality: Left;  left peroneal and popliteal artery  . [place]  . TRANSURETHRAL RESECTION OF BLADDER TUMOR  10/30/2011   Procedure: TRANSURETHRAL RESECTION OF BLADDER TUMOR (TURBT);  Surgeon: Molli Hazard, MD;  Location: [facility];  Service: Urology;  Laterality: N/A;  . TRANSURETHRAL RESECTION OF BLADDER TUMOR  12/25/2011   Procedure: TRANSURETHRAL RESECTION OF BLADDER TUMOR (TURBT);  Surgeon: Molli Hazard, MD;  Location[facility];  Service: Urology;  Laterality: N/A;  need long gyrus instruments   . VASECTOMY  1990s   Social History   Occupational History  . retired      [employer]	[place].   Social History Main Topics  . Smoking status: Former Smoker    Packs/day: 2.00    Years: 22.00    Types: Cigarettes    Quit date: 11/11/1968  . Smokeless tobacco: Never Used  . Alcohol use 1.2 oz/week    2 Glasses of wine per week     Comment: occassional  . Drug use: No  . Sexual activity: Not Currently

## 2016-09-26 NOTE — Patient Instructions (Signed)

## 2016-09-26 NOTE — Procedures (Signed)
Lumbar Facet Joint Intra-Articular Injection(s) with Fluoroscopic Guidance  Patient: [name] Small      Date of Birth: 1927/12/08 MRN: [id number] PCP: [name], MD      Visit Date: 09/26/2016   Universal Protocol:    Date/Time: 11/16/172:36 PM  Consent Given By: the patient  Position: PRONE   Additional Comments: Vital signs were monitored before and after the procedure. Patient was prepped and draped in the usual sterile fashion. The correct patient, procedure, and site was verified.   Injection Procedure Details:  Procedure Site One Meds Administered:  Meds ordered this encounter  Medications  . lidocaine (PF) (XYLOCAINE) 1 % injection 0.3 mL  . methylPREDNISolone acetate (DEPO-MEDROL) injection 80 mg     Laterality: Left  Location/Site:  L4-L5  Needle size: 22 guage  Needle type: Spinal  Needle Placement: Articular  Findings:  -Contrast Used: 1 mL iohexol 180 mg iodine/mL   -Comments: Excellent flow of contrast producing a partial arthrogram. The contrast did outline the medial facet joint cyst. We were able to aspirate some fluid but it may have been mostly contrast.  Procedure Details: The fluoroscope beam is vertically oriented in AP, and the inferior recess is visualized beneath the lower pole of the inferior apophyseal process, which represents the target point for needle insertion. When direct visualization is difficult the target point is located at the medial projection of the vertebral pedicle. The region overlying each aforementioned target is locally anesthetized with a 1 to 2 ml. volume of 1% Lidocaine without Epinephrine.   The spinal needle was inserted into each of the above mentioned facet joints using biplanar fluoroscopic guidance. A 0.25 to 0.5 ml. volume of Isovue-250 was injected and a partial facet joint arthrogram was obtained. A single spot film was obtained of the resulting arthrogram.    One to 1.25 ml of the steroid/anesthetic  solution was then injected into each of the facet joints noted above.   Additional Comments:  The patient tolerated the procedure well Dressing: Band-Aid    Post-procedure details: Patient was observed during the procedure. Post-procedure instructions were reviewed.  Patient left the clinic in stable condition.

## 2016-09-27 NOTE — Progress Notes (Signed)
[name] Small I looked at the study of [name] Small, He has severe stenosis and decompression might help but he also had multiple comorbities. I'm concerned about the risk of surgery considering cardiac and renal history. Will try to get him in for an  appt but maybe a medical eval to determine if he is a surgical candidate, or if risks are too great. jen

## 2016-09-30 ENCOUNTER — Ambulatory Visit: Payer: Medicare Other | Admitting: Cardiovascular Disease

## 2016-09-30 NOTE — Progress Notes (Signed)
Patient scheduled for 11/22 at 11am Thanks.

## 2016-10-01 ENCOUNTER — Other Ambulatory Visit: Payer: Self-pay | Admitting: Family Medicine

## 2016-10-02 ENCOUNTER — Ambulatory Visit (INDEPENDENT_AMBULATORY_CARE_PROVIDER_SITE_OTHER): Payer: Medicare Other | Admitting: Specialist

## 2016-10-02 ENCOUNTER — Encounter (INDEPENDENT_AMBULATORY_CARE_PROVIDER_SITE_OTHER): Payer: Self-pay | Admitting: Specialist

## 2016-10-02 ENCOUNTER — Other Ambulatory Visit: Payer: Self-pay

## 2016-10-02 ENCOUNTER — Other Ambulatory Visit: Payer: Self-pay | Admitting: Family Medicine

## 2016-10-02 VITALS — BP 124/61 | HR 82 | Ht 74.0 in | Wt 193.0 lb

## 2016-10-02 DIAGNOSIS — M48062 Spinal stenosis, lumbar region with neurogenic claudication: Secondary | ICD-10-CM

## 2016-10-02 MED ORDER — OXYCODONE HCL 7.5 MG PO TABS: 7.5000 mg | ORAL_TABLET | Freq: Four times a day (QID) | ORAL | 0 refills | Status: DC | PRN

## 2016-10-02 NOTE — Telephone Encounter (Signed)
Spoke to wife. She is on her way

## 2016-10-02 NOTE — Telephone Encounter (Signed)
Printed.  Thanks.  

## 2016-10-02 NOTE — Telephone Encounter (Signed)
Patient's wife is calling to get refill on Oxycodone.

## 2016-10-02 NOTE — Progress Notes (Signed)
Office Visit Note   Patient: [name] Small           Date of Birth: Mar 20, 1928           MRN: [id number] Visit Date: 10/02/2016              Requested by: Tonia Ghent, MD [name], [place] [address] PCP: [name], MD   Assessment & Plan: Visit Diagnoses:  1. Spinal stenosis of lumbar region with neurogenic claudication     Plan:Avoid bending, stooping and avoid lifting weights greater than 10 lbs. Avoid prolong standing and walking. Order for a new walker with wheels. Surgery scheduling secretary Kandice Hams, will call you in the next week to schedule for surgery.  Surgery recommended is a two level lumbar laminectomy left and central L4-5 Risk of surgery includes risk of infection 1 in 100 patients, bleeding 1/2% chance you would need a transfusion.   Risk to the nerves is one in 10,000. You will need to use a brace for 3 months and wean from the brace on the 4th month. Expect improved walking and standing tolerance. Expect relief of leg pain but numbness may persist depending on the length and degree of pressure that has been present.  Follow-Up Instructions: Return in about 4 weeks (around 10/30/2016) for Post appointment.   Orders:  No orders of the defined types were placed in this encounter.  No orders of the defined types were placed in this encounter.     Procedures: No procedures performed   Clinical Data: No additional findings.   Subjective: Chief Complaint  Patient presents with  . Lower Back - Pain    Patient was referred to you by Dr. Ernestina Patches for low back pain and possible surgical candidate. Patient complains with low back pain radiating into left leg, also states he has numbness. [place] in August has been ambulating in wheelchair since the fall. Legs are very weak. Injections did not help. Taking oxycodone for pain. MRI and xrays have been done.     Review of Systems  HENT: Positive for hearing loss.   Eyes:  Positive for visual disturbance.  Respiratory: Negative.   Cardiovascular: Negative.   Gastrointestinal: Negative.   Endocrine: Negative.   Genitourinary: Negative.   Musculoskeletal: Positive for back pain and gait problem.  Allergic/Immunologic: Negative.   Neurological: Positive for weakness and numbness.  Psychiatric/Behavioral: Negative.      Objective: Vital Signs: BP 124/61   Pulse 82   Ht 6\' 2"  (1.88 m)   Wt 193 lb (87.5 kg)   BMI 24.78 kg/m   Physical Exam  Constitutional: He is oriented to person, place, and time. He appears well-developed and well-nourished.  Eyes: EOM are normal. Pupils are equal, round, and reactive to light.  Neck: Normal range of motion. Neck supple.  Pulmonary/Chest: Effort normal and breath sounds normal.  Abdominal: Soft. Bowel sounds are normal.  Neurological: He is alert and oriented to person, place, and time.  Skin: Skin is warm and dry. No rash noted. No erythema. No pallor.    Back Exam   Tenderness  The patient is experiencing tenderness in the lumbar.  Range of Motion  Extension: abnormal  Flexion: normal  Lateral Bend Right: normal  Lateral Bend Left: normal  Rotation Right: normal  Rotation Left: normal   Muscle Strength  Right Quadriceps:  5/5  Left Quadriceps:  2/5  Right Hamstrings:  5/5  Left Hamstrings:  5/5  Tests  Straight leg raise right: negative Straight leg raise left: positive  Reflexes  Patellar: 1/4 Achilles: 1/4 Biceps: 1/4 Babinski's sign: normal   Other  Toe Walk: abnormal Heel Walk: abnormal Sensation: normal Gait: drop-foot  Erythema: no back redness Scars: absent      Specialty Comments:  No specialty comments available.  Imaging: No results found.   PMFS History: Patient Active Problem List   Diagnosis Date Noted  . Skin ulcer ([place]) 09/25/2016  . Spinal stenosis of lumbar region 09/25/2016  . Fall 07/04/2016  . Osteomyelitis of left foot ([place])   . Acute renal  failure superimposed on stage 3 chronic kidney disease ([place])   . Osteomyelitis ([place]) 06/10/2016  . COPD (chronic obstructive pulmonary disease) ([place]) 06/10/2016  . Right second toe ulcer ([place]) 05/20/2016  . Diabetic ulcer of left foot ([place]) 03/24/2016  . Diabetic ulcer of left foot associated with diabetes mellitus due to underlying condition ([place])   . Weakness 03/18/2016  . Mood disorder ([place]) 03/18/2016  . Groin hematoma   . Chronic renal failure   . Critical lower limb ischemia 02/28/2016  . Peripheral arterial disease ([place]) 10/17/2015  . Edema 07/19/2015  . Claudication ([place]) 01/31/2015  . DOE (dyspnea on exertion) 01/31/2015  . Hyperlipidemia 01/31/2015  . HTN (hypertension) 01/31/2015  . Chronic diastolic CHF (congestive heart failure) ([place]) 01/21/2014  . Seborrheic keratoses 04/30/2013  . CHF (congestive heart failure) ([place]) 12/16/2012  . Atrial fibrillation ([place]) 12/16/2012  . Hyperkalemia 11/27/2011  . Bladder cancer ([place]) 09/22/2011  . Leg cramps 08/28/2011  . Cough 05/26/2011  . CAD (coronary artery disease) of artery bypass graft 05/09/2011  . Dyspnea on exertion 04/19/2011  . PSORIASIS, SCALP 10/25/2008  . Chronic kidney disease, stage III (moderate) 12/04/2007  . Diabetes with neurologic complications ([place]) 70/26/3785  . DIVERTICULOSIS, COLON 05/21/2007  . BPH (benign prostatic hyperplasia) 05/21/2007   Past Medical History:  Diagnosis Date  . Arthritis   . Back pain    s/p lumbar injection 2014  . BENIGN PROSTATIC HYPERTROPHY 05/21/2007  . Bladder cancer ([place]) 10/2011  . CAD (coronary artery disease) CARDIOLOGIST- DR WALL - VISIT IN JUNE 2012 FOR CATH   nonobstructive by cath 6/12:  mid LAD 30%, proximal obtuse marginal-2 30%, proximal RCA 20%, mid RCA 30-40%.  He had normal cardiac output and mildly elevated filling pressures but no significant pulmonary hypertension;   Echocardiogram in May 2012 demonstrated EF 50-55% and left atrial enlargement   .  Cellulitis of left foot    Hospitalized in 2006  . CHF (congestive heart failure) ([place])    per family, issues in 2012  . Chronic kidney disease (CKD), stage III (moderate) 12/04/2007   FOLLOWED BY DR PATEL  . Chronic pain of lower extremity   . DIABETES MELLITUS, TYPE II 05/21/2007  . DIVERTICULOSIS, COLON 05/21/2007   pt denies  . Fatigue AGE-RELATED  . Gross hematuria    pt denies  . HYPERTENSION 05/21/2007  . Impaired hearing BILATERAL HEARING AIDS  . On home oxygen therapy    "2L at nighttime" (02/28/2016)  . PAD (peripheral artery disease) ([place])   . Psoriasis ELBOWS  . PSORIASIS, SCALP 10/25/2008    Family History  Problem Relation Age of Onset  . Diabetes Mother   . Heart disease Brother   . Heart disease Brother   . Heart disease Brother   . Heart disease Brother   . Heart disease Brother   . Heart disease Brother   .  Lung cancer Brother     Past Surgical History:  Procedure Laterality Date  . AMPUTATION Left 03/27/2016   Procedure: partial first AMPUTATION RAY; LEFT;  Surgeon: Trula Slade, DPM;  Location: [facility];  Service: Podiatry;  Laterality: Left;  . AMPUTATION Left 06/12/2016   Procedure: TRANSMETATARSAL AMPUTATION LEFT FOOT;  Surgeon: Newt Minion, MD;  Location: [facility];  Service: Orthopedics;  Laterality: Left;  . APPENDECTOMY  1941  . CARDIAC CATHETERIZATION  04-24-11/  DR Rogue Jury ARIDA   MILD NONOBSTRUCTIVE CAD, NORMA CARDIAC OUTPUT  . CARDIOVASCULAR STRESS TEST  2007  . CATARACT EXTRACTION W/ INTRAOCULAR LENS  IMPLANT, BILATERAL Bilateral ~ 2010  . CYSTOSCOPY  12/25/2011   Procedure: CYSTOSCOPY;  Surgeon: Molli Hazard, MD;  Location: [facility];  Service: Urology;  Laterality: N/A;  needs intubation and to be paralyzed   . CYSTOSCOPY W/ RETROGRADES  10/30/2011   Procedure: CYSTOSCOPY WITH RETROGRADE PYELOGRAM;  Surgeon: Molli Hazard, MD;  Location: [facility];  Service: Urology;  Laterality: Bilateral;   CYSTOSCOPY POSS TURBT BILATERAL RETROGRADE PYLEOGRAM   . CYSTOSCOPY W/ RETROGRADES  11/30/2012   Procedure: CYSTOSCOPY WITH RETROGRADE PYELOGRAM;  Surgeon: Molli Hazard, MD;  Location: [facility];  Service: Urology;  Laterality: Bilateral;  Flexible cystoscopy.  . CYSTOSCOPY WITH BIOPSY  11/30/2012   Procedure: CYSTOSCOPY WITH BIOPSY;  Surgeon: Molli Hazard, MD;  Location: [facility];  Service: Urology;  Laterality: N/A;  . INCISION AND DRAINAGE FOOT Left ~ 2005   LEFT FOOT DUE TO INFECTION FROM  NAIL PUNCTURE INJURY  . PENILE PROSTHESIS IMPLANT  1990s  . PERIPHERAL VASCULAR CATHETERIZATION N/A 02/14/2016   Procedure: Abdominal Aortogram w/Lower Extremity;  Surgeon: Wellington Hampshire, MD;  Location: [facility];  Service: Cardiovascular;  Laterality: N/A;  . PERIPHERAL VASCULAR CATHETERIZATION Left 02/28/2016   Procedure: Peripheral Vascular Balloon Angioplasty;  Surgeon: Wellington Hampshire, MD;  Location: [facility];  Service: Cardiovascular;  Laterality: Left;  left peroneal and popliteal artery  . [place]  . TRANSURETHRAL RESECTION OF BLADDER TUMOR  10/30/2011   Procedure: TRANSURETHRAL RESECTION OF BLADDER TUMOR (TURBT);  Surgeon: Molli Hazard, MD;  Location: [facility];  Service: Urology;  Laterality: N/A;  . TRANSURETHRAL RESECTION OF BLADDER TUMOR  12/25/2011   Procedure: TRANSURETHRAL RESECTION OF BLADDER TUMOR (TURBT);  Surgeon: Molli Hazard, MD;  Location: [facility];  Service: Urology;  Laterality: N/A;  need long gyrus instruments   . VASECTOMY  1990s   Social History   Occupational History  . retired      [employer]	[place].   Social History Main Topics  . Smoking status: Former Smoker    Packs/day: 2.00    Years: 22.00    Types: Cigarettes    Quit date: 11/11/1968  . Smokeless tobacco: Never Used  . Alcohol use 1.2 oz/week    2 Glasses of wine  per week     Comment: occassional  . Drug use: No  . Sexual activity: Not Currently

## 2016-10-02 NOTE — Patient Instructions (Signed)
Avoid bending, stooping and avoid lifting weights greater than 10 lbs. Avoid prolong standing and walking. Order for a new walker with wheels. Surgery scheduling secretary Kandice Hams, will call you in the next week to schedule for surgery.  Surgery recommended is a two level lumbar laminectomy left and central L4-5 Risk of surgery includes risk of infection 1 in 100 patients, bleeding 1/2% chance you would need a transfusion.   Risk to the nerves is one in 10,000. You will need to use a brace for 3 months and wean from the brace on the 4th month. Expect improved walking and standing tolerance. Expect relief of leg pain but numbness may persist depending on the length and degree of pressure that has been present.

## 2016-10-02 NOTE — Telephone Encounter (Signed)
He went to Ortho today for his back. He has enough Oxycodone to last until Friday. Wife says she can get here before 5pm if we will do the rx.

## 2016-10-02 NOTE — Telephone Encounter (Signed)
See previous refill request.

## 2016-10-09 ENCOUNTER — Ambulatory Visit (INDEPENDENT_AMBULATORY_CARE_PROVIDER_SITE_OTHER): Payer: Medicare Other | Admitting: Physical Medicine and Rehabilitation

## 2016-10-10 ENCOUNTER — Encounter (INDEPENDENT_AMBULATORY_CARE_PROVIDER_SITE_OTHER): Payer: Self-pay | Admitting: Specialist

## 2016-10-10 ENCOUNTER — Other Ambulatory Visit: Payer: Self-pay | Admitting: *Deleted

## 2016-10-10 ENCOUNTER — Telehealth: Payer: Self-pay | Admitting: Family Medicine

## 2016-10-10 ENCOUNTER — Encounter: Payer: Self-pay | Admitting: Family Medicine

## 2016-10-10 DIAGNOSIS — I509 Heart failure, unspecified: Secondary | ICD-10-CM | POA: Diagnosis not present

## 2016-10-10 MED ORDER — INSULIN LISPRO 100 UNIT/ML ~~LOC~~ SOLN: SUBCUTANEOUS | 2 refills | Status: DC

## 2016-10-10 MED ORDER — INSULIN ASPART 100 UNIT/ML ~~LOC~~ SOLN: 5.0000 [IU] | Freq: Three times a day (TID) | SUBCUTANEOUS | 99 refills | Status: DC

## 2016-10-10 NOTE — Telephone Encounter (Signed)
Call pt.  I sent mychart message about ortho notes.  Add on mealtime insulin.   If sugar >150 prior to meal, then take 5 units of aspartate insulin.   If sugar <150 prior to meal, then skip the dose for that meal.  rx sent. Continue lantus as is for now.  Update me re: sugar in a few days.  Thanks.

## 2016-10-10 NOTE — Telephone Encounter (Signed)
Wife advised. 

## 2016-10-11 ENCOUNTER — Other Ambulatory Visit: Payer: Self-pay

## 2016-10-11 MED ORDER — OXYCODONE HCL 7.5 MG PO TABS: 7.5000 mg | ORAL_TABLET | Freq: Four times a day (QID) | ORAL | 0 refills | Status: DC | PRN

## 2016-10-11 NOTE — Telephone Encounter (Signed)
Patient advised.  Rx left at front desk for pick up. 

## 2016-10-11 NOTE — Telephone Encounter (Signed)
Printed.  Thanks.  

## 2016-10-11 NOTE — Telephone Encounter (Signed)
Mrs Lamay [facility] signed) left v/m requesting rx oxycodone. Call when ready for pick up. Last printed # 40 on 10/02/16; last seen 09/24/16.

## 2016-10-12 DIAGNOSIS — J449 Chronic obstructive pulmonary disease, unspecified: Secondary | ICD-10-CM | POA: Diagnosis not present

## 2016-10-13 ENCOUNTER — Encounter: Payer: Self-pay | Admitting: Family Medicine

## 2016-10-14 ENCOUNTER — Telehealth: Payer: Self-pay | Admitting: Cardiovascular Disease

## 2016-10-14 ENCOUNTER — Telehealth: Payer: Self-pay | Admitting: Family Medicine

## 2016-10-14 ENCOUNTER — Encounter: Payer: Self-pay | Admitting: Cardiovascular Disease

## 2016-10-14 NOTE — Telephone Encounter (Signed)
I recently got a note from [place] about this patient, regarding operative risk, medical/cardiac clearance.  He has a planned L4-L5 laminectomy with synovial cyst excision. The patient really needs the surgery because he is miserable from pain. It is likely the case that almost regardless of his operative risk, he would still need to go through the procedure due to his level of discomfort.  I still need cardiac input regarding his perioperative risk. If cardiology is okay with the patient going through the procedure, then I am also. Please send the hardcopy to Dr. Fletcher Anon and I'll route this note to him.    Thanks.

## 2016-10-14 NOTE — Telephone Encounter (Signed)
error 

## 2016-10-14 NOTE — Telephone Encounter (Signed)
Form sign, this note printed and attached.  App cards input.

## 2016-10-14 NOTE — Telephone Encounter (Signed)
Given his age and co morbidities, he is considered at moderate risk for cardiovascular complications. No contraindications to proceed with surgery.  Thanks.   Rod Can

## 2016-10-14 NOTE — Telephone Encounter (Signed)
All info faxed to [employer].

## 2016-10-15 ENCOUNTER — Ambulatory Visit: Payer: Medicare Other | Admitting: Cardiovascular Disease

## 2016-10-16 ENCOUNTER — Encounter: Payer: Self-pay | Admitting: Family Medicine

## 2016-10-17 ENCOUNTER — Encounter (HOSPITAL_COMMUNITY): Payer: Self-pay | Admitting: *Deleted

## 2016-10-17 ENCOUNTER — Other Ambulatory Visit: Payer: Self-pay | Admitting: Family Medicine

## 2016-10-17 MED ORDER — OXYCODONE HCL 7.5 MG PO TABS: 7.5000 mg | ORAL_TABLET | Freq: Four times a day (QID) | ORAL | 0 refills | Status: DC | PRN

## 2016-10-17 MED ORDER — INSULIN GLARGINE 100 UNIT/ML ~~LOC~~ SOLN: 15.0000 [IU] | Freq: Every day | SUBCUTANEOUS | Status: DC

## 2016-10-17 NOTE — Progress Notes (Signed)
Mr [name] Small has a history of COPD, "is doing better with the inhalers he is  Currently on."  Patient has limited mobility, denies chest pain. Patient is on Plavix for PAD, patient has not been instructed to stop Plavix.  Patient has Type Ii diabetes, takes Lantus at 3pm and has taken it already.  I instructed patient to eat a snack with protein tonight. I instructed patient to check CBG to check CBG and if it is less than 70 to treat it with Glucose Gel, Glucose tablets or 1/2 cup of clear juice like apple juice or cranberry juice, or 1/2 cup of regular soda. (not cream soda). I instructed patient to recheck CBG in 15 minutes and if CBG is not greater than 70, to  Call [phone] (pre- op). If it is before pre-op opens to retreat as before and recheck CBG in 15 minutes. I told patient to make note of time that liquid is taken and amount, that surgical time may have to be adjusted.

## 2016-10-17 NOTE — Telephone Encounter (Signed)
Patient advised.  Rx left at front desk for pick up. 

## 2016-10-17 NOTE — Telephone Encounter (Signed)
Printed.  Thanks.  I wish him the best with surgery scheduled tomorrow.

## 2016-10-17 NOTE — Telephone Encounter (Signed)
Electronic refill request. Last Filled:    40 tablet 0 10/11/2016  Please advise.

## 2016-10-18 ENCOUNTER — Ambulatory Visit (HOSPITAL_COMMUNITY): Payer: Medicare Other | Admitting: Anesthesiology

## 2016-10-18 ENCOUNTER — Encounter (HOSPITAL_COMMUNITY): Payer: Self-pay | Admitting: Anesthesiology

## 2016-10-18 ENCOUNTER — Observation Stay (HOSPITAL_COMMUNITY)
Admission: RE | Admit: 2016-10-18 | Discharge: 2016-10-19 | Disposition: A | Discharge status: Home / Self Care | Payer: Medicare Other | Source: Ambulatory Visit | Attending: Specialist | Admitting: Specialist

## 2016-10-18 ENCOUNTER — Encounter (HOSPITAL_COMMUNITY)
Admission: RE | Disposition: A | Discharge status: Home / Self Care | Payer: Self-pay | Source: Ambulatory Visit | Attending: Specialist

## 2016-10-18 ENCOUNTER — Ambulatory Visit (HOSPITAL_COMMUNITY): Payer: Medicare Other

## 2016-10-18 DIAGNOSIS — Z9981 Dependence on supplemental oxygen: Secondary | ICD-10-CM | POA: Diagnosis not present

## 2016-10-18 DIAGNOSIS — I5032 Chronic diastolic (congestive) heart failure: Secondary | ICD-10-CM | POA: Diagnosis not present

## 2016-10-18 DIAGNOSIS — E1151 Type 2 diabetes mellitus with diabetic peripheral angiopathy without gangrene: Secondary | ICD-10-CM | POA: Diagnosis not present

## 2016-10-18 DIAGNOSIS — I509 Heart failure, unspecified: Secondary | ICD-10-CM | POA: Insufficient documentation

## 2016-10-18 DIAGNOSIS — Z888 Allergy status to other drugs, medicaments and biological substances status: Secondary | ICD-10-CM | POA: Insufficient documentation

## 2016-10-18 DIAGNOSIS — Z881 Allergy status to other antibiotic agents status: Secondary | ICD-10-CM | POA: Insufficient documentation

## 2016-10-18 DIAGNOSIS — Z9889 Other specified postprocedural states: Secondary | ICD-10-CM | POA: Diagnosis not present

## 2016-10-18 DIAGNOSIS — I110 Hypertensive heart disease with heart failure: Secondary | ICD-10-CM | POA: Diagnosis not present

## 2016-10-18 DIAGNOSIS — M48062 Spinal stenosis, lumbar region with neurogenic claudication: Principal | ICD-10-CM | POA: Diagnosis present

## 2016-10-18 DIAGNOSIS — M48061 Spinal stenosis, lumbar region without neurogenic claudication: Secondary | ICD-10-CM | POA: Diagnosis not present

## 2016-10-18 DIAGNOSIS — Z8551 Personal history of malignant neoplasm of bladder: Secondary | ICD-10-CM | POA: Insufficient documentation

## 2016-10-18 DIAGNOSIS — M1990 Unspecified osteoarthritis, unspecified site: Secondary | ICD-10-CM | POA: Diagnosis not present

## 2016-10-18 DIAGNOSIS — M5416 Radiculopathy, lumbar region: Secondary | ICD-10-CM | POA: Diagnosis present

## 2016-10-18 DIAGNOSIS — E1122 Type 2 diabetes mellitus with diabetic chronic kidney disease: Secondary | ICD-10-CM | POA: Insufficient documentation

## 2016-10-18 DIAGNOSIS — J449 Chronic obstructive pulmonary disease, unspecified: Secondary | ICD-10-CM | POA: Insufficient documentation

## 2016-10-18 DIAGNOSIS — Z7902 Long term (current) use of antithrombotics/antiplatelets: Secondary | ICD-10-CM | POA: Insufficient documentation

## 2016-10-18 DIAGNOSIS — Z9841 Cataract extraction status, right eye: Secondary | ICD-10-CM | POA: Insufficient documentation

## 2016-10-18 DIAGNOSIS — H9190 Unspecified hearing loss, unspecified ear: Secondary | ICD-10-CM | POA: Insufficient documentation

## 2016-10-18 DIAGNOSIS — Z8719 Personal history of other diseases of the digestive system: Secondary | ICD-10-CM | POA: Diagnosis not present

## 2016-10-18 DIAGNOSIS — Z961 Presence of intraocular lens: Secondary | ICD-10-CM | POA: Insufficient documentation

## 2016-10-18 DIAGNOSIS — M7138 Other bursal cyst, other site: Secondary | ICD-10-CM | POA: Diagnosis not present

## 2016-10-18 DIAGNOSIS — I130 Hypertensive heart and chronic kidney disease with heart failure and stage 1 through stage 4 chronic kidney disease, or unspecified chronic kidney disease: Secondary | ICD-10-CM | POA: Insufficient documentation

## 2016-10-18 DIAGNOSIS — L409 Psoriasis, unspecified: Secondary | ICD-10-CM | POA: Diagnosis not present

## 2016-10-18 DIAGNOSIS — Z801 Family history of malignant neoplasm of trachea, bronchus and lung: Secondary | ICD-10-CM | POA: Insufficient documentation

## 2016-10-18 DIAGNOSIS — I2510 Atherosclerotic heart disease of native coronary artery without angina pectoris: Secondary | ICD-10-CM | POA: Insufficient documentation

## 2016-10-18 DIAGNOSIS — M5136 Other intervertebral disc degeneration, lumbar region: Secondary | ICD-10-CM | POA: Insufficient documentation

## 2016-10-18 DIAGNOSIS — Z419 Encounter for procedure for purposes other than remedying health state, unspecified: Secondary | ICD-10-CM

## 2016-10-18 DIAGNOSIS — M8568 Other cyst of bone, other site: Secondary | ICD-10-CM | POA: Diagnosis not present

## 2016-10-18 DIAGNOSIS — Z9842 Cataract extraction status, left eye: Secondary | ICD-10-CM | POA: Insufficient documentation

## 2016-10-18 DIAGNOSIS — Z794 Long term (current) use of insulin: Secondary | ICD-10-CM | POA: Insufficient documentation

## 2016-10-18 DIAGNOSIS — Z8249 Family history of ischemic heart disease and other diseases of the circulatory system: Secondary | ICD-10-CM | POA: Insufficient documentation

## 2016-10-18 DIAGNOSIS — Z886 Allergy status to analgesic agent status: Secondary | ICD-10-CM | POA: Insufficient documentation

## 2016-10-18 DIAGNOSIS — N183 Chronic kidney disease, stage 3 (moderate): Secondary | ICD-10-CM | POA: Insufficient documentation

## 2016-10-18 DIAGNOSIS — Z89412 Acquired absence of left great toe: Secondary | ICD-10-CM | POA: Insufficient documentation

## 2016-10-18 DIAGNOSIS — Z882 Allergy status to sulfonamides status: Secondary | ICD-10-CM | POA: Insufficient documentation

## 2016-10-18 DIAGNOSIS — Z87891 Personal history of nicotine dependence: Secondary | ICD-10-CM | POA: Insufficient documentation

## 2016-10-18 DIAGNOSIS — Z833 Family history of diabetes mellitus: Secondary | ICD-10-CM | POA: Insufficient documentation

## 2016-10-18 HISTORY — DX: Dyspnea, unspecified: R06.00

## 2016-10-18 HISTORY — PX: LUMBAR LAMINECTOMY/DECOMPRESSION MICRODISCECTOMY: SHX5026

## 2016-10-18 HISTORY — DX: Pneumonia, unspecified organism: J18.9

## 2016-10-18 HISTORY — DX: Adverse effect of unspecified anesthetic, initial encounter: T41.45XA

## 2016-10-18 HISTORY — DX: Other complications of anesthesia, initial encounter: T88.59XA

## 2016-10-18 HISTORY — DX: Chronic obstructive pulmonary disease, unspecified: J44.9

## 2016-10-18 LAB — PROTIME-INR
INR: 1.02
Prothrombin Time: 13.4 seconds (ref 11.4–15.2)

## 2016-10-18 LAB — CBC
HCT: 42.5 % (ref 39.0–52.0)
Hemoglobin: 14.3 g/dL (ref 13.0–17.0)
MCH: 32 pg (ref 26.0–34.0)
MCHC: 33.6 g/dL (ref 30.0–36.0)
MCV: 95.1 fL (ref 78.0–100.0)
Platelets: 297 10*3/uL (ref 150–400)
RBC: 4.47 MIL/uL (ref 4.22–5.81)
RDW: 13.3 % (ref 11.5–15.5)
WBC: 9 10*3/uL (ref 4.0–10.5)

## 2016-10-18 LAB — COMPREHENSIVE METABOLIC PANEL
ALT: 12 U/L — ABNORMAL LOW (ref 17–63)
AST: 18 U/L (ref 15–41)
Albumin: 3.8 g/dL (ref 3.5–5.0)
Alkaline Phosphatase: 73 U/L (ref 38–126)
Anion gap: 12 (ref 5–15)
BUN: 37 mg/dL — ABNORMAL HIGH (ref 6–20)
CO2: 29 mmol/L (ref 22–32)
Calcium: 9.2 mg/dL (ref 8.9–10.3)
Chloride: 97 mmol/L — ABNORMAL LOW (ref 101–111)
Creatinine, Ser: 1.88 mg/dL — ABNORMAL HIGH (ref 0.61–1.24)
GFR calc Af Amer: 36 mL/min — ABNORMAL LOW (ref >60)
GFR calc non Af Amer: 31 mL/min — ABNORMAL LOW (ref >60)
Glucose, Bld: 138 mg/dL — ABNORMAL HIGH (ref 65–99)
Potassium: 4 mmol/L (ref 3.5–5.1)
Sodium: 138 mmol/L (ref 135–145)
Total Bilirubin: 0.7 mg/dL (ref 0.3–1.2)
Total Protein: 7.2 g/dL (ref 6.5–8.1)

## 2016-10-18 LAB — GLUCOSE, CAPILLARY
Glucose-Capillary: 114 mg/dL — ABNORMAL HIGH (ref 65–99)
Glucose-Capillary: 116 mg/dL — ABNORMAL HIGH (ref 65–99)
Glucose-Capillary: 147 mg/dL — ABNORMAL HIGH (ref 65–99)

## 2016-10-18 LAB — SURGICAL PCR SCREEN
MRSA, PCR: NEGATIVE
Staphylococcus aureus: NEGATIVE

## 2016-10-18 LAB — APTT: aPTT: 32 seconds (ref 24–36)

## 2016-10-18 SURGERY — LUMBAR LAMINECTOMY/DECOMPRESSION MICRODISCECTOMY
Anesthesia: General

## 2016-10-18 MED ORDER — PHENYLEPHRINE HCL 10 MG/ML IJ SOLN
INTRAVENOUS | Status: DC | PRN
  Administered 2016-10-18: 20 ug/min via INTRAVENOUS

## 2016-10-18 MED ORDER — INSULIN ASPART 100 UNIT/ML ~~LOC~~ SOLN
3.0000 [IU] | Freq: Three times a day (TID) | SUBCUTANEOUS | Status: DC
  Administered 2016-10-19: 3 [IU] via SUBCUTANEOUS

## 2016-10-18 MED ORDER — ONDANSETRON HCL 4 MG/2ML IJ SOLN: 4.0000 mg | INTRAMUSCULAR | Status: DC | PRN

## 2016-10-18 MED ORDER — ACETAMINOPHEN 325 MG PO TABS: 650.0000 mg | ORAL_TABLET | ORAL | Status: DC | PRN

## 2016-10-18 MED ORDER — ALUM & MAG HYDROXIDE-SIMETH 200-200-20 MG/5ML PO SUSP
30.0000 mL | Freq: Four times a day (QID) | ORAL | Status: DC | PRN
  Filled 2016-10-18: qty 30

## 2016-10-18 MED ORDER — CHLORHEXIDINE GLUCONATE 4 % EX LIQD: 60.0000 mL | Freq: Once | CUTANEOUS | Status: DC

## 2016-10-18 MED ORDER — THROMBIN 20000 UNITS EX KIT
PACK | CUTANEOUS | Status: AC
  Filled 2016-10-18: qty 1

## 2016-10-18 MED ORDER — THROMBIN 20000 UNITS EX KIT
PACK | CUTANEOUS | Status: DC | PRN
  Administered 2016-10-18: 20000 [IU] via TOPICAL

## 2016-10-18 MED ORDER — ACETAMINOPHEN 500 MG PO TABS: 1000.0000 mg | ORAL_TABLET | Freq: Three times a day (TID) | ORAL | Status: DC | PRN

## 2016-10-18 MED ORDER — BUPIVACAINE HCL (PF) 0.5 % IJ SOLN
INTRAMUSCULAR | Status: AC
  Filled 2016-10-18: qty 30

## 2016-10-18 MED ORDER — SODIUM CHLORIDE 0.9% FLUSH
3.0000 mL | Freq: Two times a day (BID) | INTRAVENOUS | Status: DC
  Administered 2016-10-19: 3 mL via INTRAVENOUS

## 2016-10-18 MED ORDER — FLEET ENEMA 7-19 GM/118ML RE ENEM: 1.0000 | ENEMA | Freq: Once | RECTAL | Status: DC | PRN

## 2016-10-18 MED ORDER — DOCUSATE SODIUM 100 MG PO CAPS
100.0000 mg | ORAL_CAPSULE | Freq: Two times a day (BID) | ORAL | Status: DC
  Administered 2016-10-18 – 2016-10-19 (×2): 100 mg via ORAL
  Filled 2016-10-18 (×2): qty 1

## 2016-10-18 MED ORDER — METHOCARBAMOL 500 MG PO TABS
500.0000 mg | ORAL_TABLET | Freq: Four times a day (QID) | ORAL | Status: DC | PRN
  Administered 2016-10-19: 500 mg via ORAL
  Filled 2016-10-18 (×2): qty 1

## 2016-10-18 MED ORDER — LIDOCAINE HCL (CARDIAC) 20 MG/ML IV SOLN
INTRAVENOUS | Status: DC | PRN
  Administered 2016-10-18: 100 mg via INTRAVENOUS

## 2016-10-18 MED ORDER — ONDANSETRON HCL 4 MG/2ML IJ SOLN
INTRAMUSCULAR | Status: AC
  Filled 2016-10-18: qty 2

## 2016-10-18 MED ORDER — HYDROCODONE-ACETAMINOPHEN 5-325 MG PO TABS
1.0000 | ORAL_TABLET | ORAL | Status: DC | PRN
  Administered 2016-10-19 (×2): 2 via ORAL
  Filled 2016-10-18 (×2): qty 2

## 2016-10-18 MED ORDER — ATORVASTATIN CALCIUM 10 MG PO TABS
10.0000 mg | ORAL_TABLET | Freq: Every day | ORAL | Status: DC
  Administered 2016-10-18 – 2016-10-19 (×2): 10 mg via ORAL
  Filled 2016-10-18 (×2): qty 1

## 2016-10-18 MED ORDER — METHOCARBAMOL 1000 MG/10ML IJ SOLN
500.0000 mg | Freq: Four times a day (QID) | INTRAVENOUS | Status: DC | PRN
  Filled 2016-10-18: qty 5

## 2016-10-18 MED ORDER — SODIUM CHLORIDE 0.9 % IV SOLN: 250.0000 mL | INTRAVENOUS | Status: DC

## 2016-10-18 MED ORDER — INSULIN ASPART 100 UNIT/ML ~~LOC~~ SOLN
0.0000 [IU] | Freq: Three times a day (TID) | SUBCUTANEOUS | Status: DC
  Administered 2016-10-19: 3 [IU] via SUBCUTANEOUS
  Administered 2016-10-19: 8 [IU] via SUBCUTANEOUS

## 2016-10-18 MED ORDER — FUROSEMIDE 40 MG PO TABS
40.0000 mg | ORAL_TABLET | Freq: Every day | ORAL | Status: DC
  Administered 2016-10-19: 40 mg via ORAL
  Filled 2016-10-18: qty 1

## 2016-10-18 MED ORDER — MUPIROCIN 2 % EX OINT
TOPICAL_OINTMENT | CUTANEOUS | Status: AC
  Administered 2016-10-18: 1 via TOPICAL
  Filled 2016-10-18: qty 22

## 2016-10-18 MED ORDER — ONDANSETRON HCL 4 MG/2ML IJ SOLN: 4.0000 mg | Freq: Once | INTRAMUSCULAR | Status: DC | PRN

## 2016-10-18 MED ORDER — SUGAMMADEX SODIUM 200 MG/2ML IV SOLN
INTRAVENOUS | Status: DC | PRN
  Administered 2016-10-18: 175 mg via INTRAVENOUS

## 2016-10-18 MED ORDER — INSULIN ASPART 100 UNIT/ML ~~LOC~~ SOLN: 0.0000 [IU] | Freq: Every day | SUBCUTANEOUS | Status: DC

## 2016-10-18 MED ORDER — CLOPIDOGREL BISULFATE 75 MG PO TABS
75.0000 mg | ORAL_TABLET | Freq: Every day | ORAL | Status: DC
  Administered 2016-10-19: 75 mg via ORAL
  Filled 2016-10-18: qty 1

## 2016-10-18 MED ORDER — PROPOFOL 10 MG/ML IV BOLUS
INTRAVENOUS | Status: DC | PRN
  Administered 2016-10-18: 140 mg via INTRAVENOUS

## 2016-10-18 MED ORDER — MOMETASONE FURO-FORMOTEROL FUM 200-5 MCG/ACT IN AERO
2.0000 | INHALATION_SPRAY | Freq: Two times a day (BID) | RESPIRATORY_TRACT | Status: DC
  Administered 2016-10-18 – 2016-10-19 (×2): 2 via RESPIRATORY_TRACT
  Filled 2016-10-18: qty 8.8

## 2016-10-18 MED ORDER — CEFAZOLIN IN D5W 1 GM/50ML IV SOLN
1.0000 g | Freq: Three times a day (TID) | INTRAVENOUS | Status: AC
  Administered 2016-10-18 – 2016-10-19 (×2): 1 g via INTRAVENOUS
  Filled 2016-10-18 (×2): qty 50

## 2016-10-18 MED ORDER — FENTANYL CITRATE (PF) 100 MCG/2ML IJ SOLN
25.0000 ug | Freq: Once | INTRAMUSCULAR | Status: AC
  Administered 2016-10-18: 25 ug via INTRAVENOUS

## 2016-10-18 MED ORDER — TIOTROPIUM BROMIDE MONOHYDRATE 18 MCG IN CAPS
18.0000 ug | ORAL_CAPSULE | Freq: Every day | RESPIRATORY_TRACT | Status: DC
  Administered 2016-10-19: 18 ug via RESPIRATORY_TRACT
  Filled 2016-10-18: qty 5

## 2016-10-18 MED ORDER — BUPIVACAINE LIPOSOME 1.3 % IJ SUSP
20.0000 mL | Freq: Once | INTRAMUSCULAR | Status: DC
  Filled 2016-10-18: qty 20

## 2016-10-18 MED ORDER — ZOLPIDEM TARTRATE 5 MG PO TABS: 5.0000 mg | ORAL_TABLET | Freq: Every evening | ORAL | Status: DC | PRN

## 2016-10-18 MED ORDER — FENTANYL CITRATE (PF) 100 MCG/2ML IJ SOLN
INTRAMUSCULAR | Status: AC
  Filled 2016-10-18: qty 2

## 2016-10-18 MED ORDER — OXYCODONE-ACETAMINOPHEN 7.5-325 MG PO TABS: 1.0000 | ORAL_TABLET | Freq: Four times a day (QID) | ORAL | 0 refills | Status: DC | PRN

## 2016-10-18 MED ORDER — PROPOFOL 10 MG/ML IV BOLUS
INTRAVENOUS | Status: AC
  Filled 2016-10-18: qty 20

## 2016-10-18 MED ORDER — SODIUM CHLORIDE 0.9% FLUSH: 3.0000 mL | INTRAVENOUS | Status: DC | PRN

## 2016-10-18 MED ORDER — OXYCODONE HCL 5 MG PO TABS
7.5000 mg | ORAL_TABLET | Freq: Four times a day (QID) | ORAL | Status: DC | PRN
  Administered 2016-10-19: 10 mg via ORAL
  Filled 2016-10-18: qty 2

## 2016-10-18 MED ORDER — MENTHOL 3 MG MT LOZG: 1.0000 | LOZENGE | OROMUCOSAL | Status: DC | PRN

## 2016-10-18 MED ORDER — PHENYLEPHRINE 40 MCG/ML (10ML) SYRINGE FOR IV PUSH (FOR BLOOD PRESSURE SUPPORT)
PREFILLED_SYRINGE | INTRAVENOUS | Status: AC
  Filled 2016-10-18: qty 10

## 2016-10-18 MED ORDER — MUPIROCIN 2 % EX OINT
1.0000 "application " | TOPICAL_OINTMENT | Freq: Once | CUTANEOUS | Status: AC
  Administered 2016-10-18: 1 via TOPICAL

## 2016-10-18 MED ORDER — SUGAMMADEX SODIUM 200 MG/2ML IV SOLN
INTRAVENOUS | Status: AC
  Filled 2016-10-18: qty 2

## 2016-10-18 MED ORDER — LACTATED RINGERS IV SOLN: INTRAVENOUS | Status: DC

## 2016-10-18 MED ORDER — PANTOPRAZOLE SODIUM 40 MG IV SOLR
40.0000 mg | Freq: Every day | INTRAVENOUS | Status: DC
  Administered 2016-10-18: 40 mg via INTRAVENOUS
  Filled 2016-10-18: qty 40

## 2016-10-18 MED ORDER — MORPHINE SULFATE (PF) 2 MG/ML IV SOLN: 1.0000 mg | INTRAVENOUS | Status: DC | PRN

## 2016-10-18 MED ORDER — SODIUM CHLORIDE 0.9 % IV SOLN
INTRAVENOUS | Status: DC
  Administered 2016-10-18: 18:00:00 via INTRAVENOUS

## 2016-10-18 MED ORDER — SODIUM CHLORIDE 0.9 % IV SOLN
INTRAVENOUS | Status: DC
  Administered 2016-10-18 (×3): via INTRAVENOUS

## 2016-10-18 MED ORDER — AMLODIPINE BESYLATE 5 MG PO TABS
5.0000 mg | ORAL_TABLET | Freq: Every day | ORAL | Status: DC
  Administered 2016-10-18 – 2016-10-19 (×2): 5 mg via ORAL
  Filled 2016-10-18 (×2): qty 1

## 2016-10-18 MED ORDER — CEFAZOLIN SODIUM-DEXTROSE 2-4 GM/100ML-% IV SOLN
2.0000 g | INTRAVENOUS | Status: AC
  Administered 2016-10-18: 2 g via INTRAVENOUS
  Filled 2016-10-18: qty 100

## 2016-10-18 MED ORDER — ALBUTEROL SULFATE (2.5 MG/3ML) 0.083% IN NEBU: 2.5000 mg | INHALATION_SOLUTION | Freq: Four times a day (QID) | RESPIRATORY_TRACT | Status: DC | PRN

## 2016-10-18 MED ORDER — ACETAMINOPHEN 650 MG RE SUPP: 650.0000 mg | RECTAL | Status: DC | PRN

## 2016-10-18 MED ORDER — PHENOL 1.4 % MT LIQD: 1.0000 | OROMUCOSAL | Status: DC | PRN

## 2016-10-18 MED ORDER — FENTANYL CITRATE (PF) 100 MCG/2ML IJ SOLN
INTRAMUSCULAR | Status: DC | PRN
  Administered 2016-10-18 (×2): 50 ug via INTRAVENOUS

## 2016-10-18 MED ORDER — LIDOCAINE 2% (20 MG/ML) 5 ML SYRINGE
INTRAMUSCULAR | Status: AC
  Filled 2016-10-18: qty 5

## 2016-10-18 MED ORDER — CLOBETASOL PROPIONATE 0.05 % EX SOLN: 1.0000 "application " | Freq: Every day | CUTANEOUS | Status: DC

## 2016-10-18 MED ORDER — BISACODYL 5 MG PO TBEC: 5.0000 mg | DELAYED_RELEASE_TABLET | Freq: Every day | ORAL | Status: DC | PRN

## 2016-10-18 MED ORDER — ONDANSETRON HCL 4 MG/2ML IJ SOLN
INTRAMUSCULAR | Status: DC | PRN
  Administered 2016-10-18: 4 mg via INTRAVENOUS

## 2016-10-18 MED ORDER — ROCURONIUM BROMIDE 100 MG/10ML IV SOLN
INTRAVENOUS | Status: DC | PRN
  Administered 2016-10-18: 50 mg via INTRAVENOUS

## 2016-10-18 MED ORDER — FENTANYL CITRATE (PF) 100 MCG/2ML IJ SOLN: 25.0000 ug | INTRAMUSCULAR | Status: DC | PRN

## 2016-10-18 MED ORDER — GABAPENTIN 100 MG PO CAPS
100.0000 mg | ORAL_CAPSULE | Freq: Three times a day (TID) | ORAL | Status: DC
  Administered 2016-10-18 – 2016-10-19 (×2): 100 mg via ORAL
  Filled 2016-10-18 (×2): qty 1

## 2016-10-18 MED ORDER — MEPERIDINE HCL 25 MG/ML IJ SOLN: 6.2500 mg | INTRAMUSCULAR | Status: DC | PRN

## 2016-10-18 MED ORDER — BUPIVACAINE LIPOSOME 1.3 % IJ SUSP
INTRAMUSCULAR | Status: DC | PRN
  Administered 2016-10-18: 20 mL

## 2016-10-18 MED ORDER — FENTANYL CITRATE (PF) 100 MCG/2ML IJ SOLN
INTRAMUSCULAR | Status: AC
  Administered 2016-10-18: 25 ug via INTRAVENOUS
  Filled 2016-10-18: qty 2

## 2016-10-18 MED ORDER — POLYETHYLENE GLYCOL 3350 17 G PO PACK: 17.0000 g | PACK | Freq: Every day | ORAL | Status: DC | PRN

## 2016-10-18 MED ORDER — PHENYLEPHRINE 40 MCG/ML (10ML) SYRINGE FOR IV PUSH (FOR BLOOD PRESSURE SUPPORT)
PREFILLED_SYRINGE | INTRAVENOUS | Status: DC | PRN
  Administered 2016-10-18: 40 ug via INTRAVENOUS
  Administered 2016-10-18 (×3): 80 ug via INTRAVENOUS

## 2016-10-18 MED ORDER — BUPIVACAINE HCL 0.5 % IJ SOLN
INTRAMUSCULAR | Status: DC | PRN
  Administered 2016-10-18: 20 mL

## 2016-10-18 SURGICAL SUPPLY — 49 items
ADH SKN CLS APL DERMABOND .7 (GAUZE/BANDAGES/DRESSINGS) ×1
BUR RND FLUTED 2.5 (BURR) IMPLANT
BUR SABER RD CUTTING 3.0 (BURR) ×1 IMPLANT
CANISTER SUCTION 2500CC (MISCELLANEOUS) ×2 IMPLANT
COVER SURGICAL LIGHT HANDLE (MISCELLANEOUS) ×2 IMPLANT
DERMABOND ADVANCED (GAUZE/BANDAGES/DRESSINGS) ×1
DERMABOND ADVANCED .7 DNX12 (GAUZE/BANDAGES/DRESSINGS) ×1 IMPLANT
DRAPE INCISE IOBAN 66X45 STRL (DRAPES) IMPLANT
DRAPE MICROSCOPE LEICA (MISCELLANEOUS) ×2 IMPLANT
DRAPE PROXIMA HALF (DRAPES) IMPLANT
DRAPE SURG 17X23 STRL (DRAPES) ×8 IMPLANT
DRSG MEPILEX BORDER 4X4 (GAUZE/BANDAGES/DRESSINGS) ×1 IMPLANT
DRSG MEPILEX BORDER 4X8 (GAUZE/BANDAGES/DRESSINGS) IMPLANT
DURAPREP 26ML APPLICATOR (WOUND CARE) ×2 IMPLANT
ELECT REM PT RETURN 9FT ADLT (ELECTROSURGICAL) ×2
ELECTRODE REM PT RTRN 9FT ADLT (ELECTROSURGICAL) ×1 IMPLANT
EVACUATOR 1/8 PVC DRAIN (DRAIN) IMPLANT
GLOVE BIOGEL PI IND STRL 8 (GLOVE) ×1 IMPLANT
GLOVE BIOGEL PI INDICATOR 8 (GLOVE) ×1
GLOVE ECLIPSE 9.0 STRL (GLOVE) ×2 IMPLANT
GLOVE ORTHO TXT STRL SZ7.5 (GLOVE) ×2 IMPLANT
GLOVE SURG 8.5 LATEX PF (GLOVE) ×2 IMPLANT
GOWN STRL REUS W/ TWL LRG LVL3 (GOWN DISPOSABLE) ×1 IMPLANT
GOWN STRL REUS W/TWL 2XL LVL3 (GOWN DISPOSABLE) ×3 IMPLANT
GOWN STRL REUS W/TWL LRG LVL3 (GOWN DISPOSABLE) ×2
KIT BASIN OR (CUSTOM PROCEDURE TRAY) ×2 IMPLANT
KIT ROOM TURNOVER OR (KITS) ×2 IMPLANT
NDL SPNL 18GX3.5 QUINCKE PK (NEEDLE) ×2 IMPLANT
NEEDLE SPNL 18GX3.5 QUINCKE PK (NEEDLE) ×4 IMPLANT
NS IRRIG 1000ML POUR BTL (IV SOLUTION) ×2 IMPLANT
PACK LAMINECTOMY ORTHO (CUSTOM PROCEDURE TRAY) ×2 IMPLANT
PAD ARMBOARD 7.5X6 YLW CONV (MISCELLANEOUS) ×6 IMPLANT
PATTIES SURGICAL .5 X.5 (GAUZE/BANDAGES/DRESSINGS) ×1 IMPLANT
PATTIES SURGICAL .75X.75 (GAUZE/BANDAGES/DRESSINGS) IMPLANT
PATTIES SURGICAL 1X1 (DISPOSABLE) IMPLANT
SPONGE LAP 4X18 X RAY DECT (DISPOSABLE) ×1 IMPLANT
SPONGE SURGIFOAM ABS GEL 100 (HEMOSTASIS) ×1 IMPLANT
SUT VIC AB 0 CT1 27 (SUTURE)
SUT VIC AB 0 CT1 27XBRD ANBCTR (SUTURE) IMPLANT
SUT VIC AB 1 CT1 27 (SUTURE)
SUT VIC AB 1 CT1 27XBRD ANBCTR (SUTURE) IMPLANT
SUT VIC AB 2-0 CT1 27 (SUTURE) ×2
SUT VIC AB 2-0 CT1 TAPERPNT 27 (SUTURE) IMPLANT
SUT VIC AB 3-0 X1 27 (SUTURE) ×2 IMPLANT
SUT VICRYL 0 UR6 27IN ABS (SUTURE) ×2 IMPLANT
TOWEL OR 17X24 6PK STRL BLUE (TOWEL DISPOSABLE) ×2 IMPLANT
TOWEL OR 17X26 10 PK STRL BLUE (TOWEL DISPOSABLE) ×2 IMPLANT
TRAY FOLEY CATH 16FRSI W/METER (SET/KITS/TRAYS/PACK) IMPLANT
WATER STERILE IRR 1000ML POUR (IV SOLUTION) ×1 IMPLANT

## 2016-10-18 NOTE — Progress Notes (Signed)
Patient transferred to [place]. Patient is alert, oriented x4, answers questions appropriately. Incision dressing is clean, dry, intact, no drainage. Sacral dressing is clean, dry, intact. No other skin issues noted. Patient and family oriented to room, unit. Food tray ordered for patient. Will continue to monitor.

## 2016-10-18 NOTE — H&P (Signed)
[name] Small is an 82 y.o. male.   Chief Complaint: Low back pain and  lower extremity radiculopathy. HPI: Patient history of L4-5 stenosis above complaints presents to the hospital with progressively worsening symptoms.  Failed conservative treatment.  Past Medical History:  Diagnosis Date  . Arthritis   . Back pain    s/p lumbar injection 2014  . BENIGN PROSTATIC HYPERTROPHY 05/21/2007  . Bladder cancer ([place]) 10/2011  . CAD (coronary artery disease) CARDIOLOGIST- DR WALL - VISIT IN JUNE 2012 FOR CATH   nonobstructive by cath 6/12:  mid LAD 30%, proximal obtuse marginal-2 30%, proximal RCA 20%, mid RCA 30-40%.  He had normal cardiac output and mildly elevated filling pressures but no significant pulmonary hypertension;   Echocardiogram in May 2012 demonstrated EF 50-55% and left atrial enlargement   . Cellulitis of left foot    Hospitalized in 2006  . CHF (congestive heart failure) ([place])    per family, issues in 2012  . Chronic kidney disease (CKD), stage III (moderate) 12/04/2007   FOLLOWED BY DR PATEL  . Chronic pain of lower extremity   . Complication of anesthesia    1 time bladder cancer surgery- 2012 , oxygen satruratioon dropped had to stay overnight, no problems since then.  Marland Kitchen COPD (chronic obstructive pulmonary disease) ([place])   . DIABETES MELLITUS, TYPE II 05/21/2007  . DIVERTICULOSIS, COLON 05/21/2007   pt denies  . Dyspnea    with exertion, patient mointors saturation  . Fatigue AGE-RELATED  . Gross hematuria    pt denies  . HYPERTENSION 05/21/2007  . Impaired hearing BILATERAL HEARING AIDS  . On home oxygen therapy    "2L at nighttime" (02/28/2016)  . PAD (peripheral artery disease) ([place])   . Pneumonia    Hx  of   . Psoriasis ELBOWS  . Psoriasis   . PSORIASIS, SCALP 10/25/2008    Past Surgical History:  Procedure Laterality Date  . AMPUTATION Left 03/27/2016   Procedure: partial first AMPUTATION RAY; LEFT;  Surgeon: Trula Slade, DPM;  Location: [place];   Service: Podiatry;  Laterality: Left;  . AMPUTATION Left 06/12/2016   Procedure: TRANSMETATARSAL AMPUTATION LEFT FOOT;  Surgeon: Newt Minion, MD;  Location: [place];  Service: Orthopedics;  Laterality: Left;  . APPENDECTOMY  1941  . CARDIAC CATHETERIZATION  04-24-11/  DR Rogue Jury ARIDA   MILD NONOBSTRUCTIVE CAD, NORMA CARDIAC OUTPUT  . CARDIOVASCULAR STRESS TEST  2007  . CATARACT EXTRACTION W/ INTRAOCULAR LENS  IMPLANT, BILATERAL Bilateral ~ 2010  . CYSTOSCOPY  12/25/2011   Procedure: CYSTOSCOPY;  Surgeon: Molli Hazard, MD;  Location: [facility];  Service: Urology;  Laterality: N/A;  needs intubation and to be paralyzed   . CYSTOSCOPY W/ RETROGRADES  10/30/2011   Procedure: CYSTOSCOPY WITH RETROGRADE PYELOGRAM;  Surgeon: Molli Hazard, MD;  Location: [facility];  Service: Urology;  Laterality: Bilateral;  CYSTOSCOPY POSS TURBT BILATERAL RETROGRADE PYLEOGRAM   . CYSTOSCOPY W/ RETROGRADES  11/30/2012   Procedure: CYSTOSCOPY WITH RETROGRADE PYELOGRAM;  Surgeon: Molli Hazard, MD;  Location: [facility];  Service: Urology;  Laterality: Bilateral;  Flexible cystoscopy.  . CYSTOSCOPY WITH BIOPSY  11/30/2012   Procedure: CYSTOSCOPY WITH BIOPSY;  Surgeon: Molli Hazard, MD;  Location[facility];  Service: Urology;  Laterality: N/A;  . INCISION AND DRAINAGE FOOT Left ~ 2005   LEFT FOOT DUE TO INFECTION FROM  NAIL PUNCTURE INJURY  . PENILE PROSTHESIS IMPLANT  1990s  . PERIPHERAL VASCULAR CATHETERIZATION N/A 02/14/2016   Procedure: Abdominal Aortogram w/Lower Extremity;  Surgeon: Wellington Hampshire, MD;  Location: [address] [facility];  Service: Cardiovascular;  Laterality: N/A;  . PERIPHERAL VASCULAR CATHETERIZATION Left 02/28/2016   Procedure: Peripheral Vascular Balloon Angioplasty;  Surgeon: Wellington Hampshire, MD;  Location: [place] [facility];  Service: Cardiovascular;  Laterality: Left;  left peroneal and popliteal  artery  . [place]  . TRANSURETHRAL RESECTION OF BLADDER TUMOR  10/30/2011   Procedure: TRANSURETHRAL RESECTION OF BLADDER TUMOR (TURBT);  Surgeon: Molli Hazard, MD;  Location: [facility];  Service: Urology;  Laterality: N/A;  . TRANSURETHRAL RESECTION OF BLADDER TUMOR  12/25/2011   Procedure: TRANSURETHRAL RESECTION OF BLADDER TUMOR (TURBT);  Surgeon: Molli Hazard, MD;  Location: [facility];  Service: Urology;  Laterality: N/A;  need long gyrus instruments   . VASECTOMY  1990s    Family History  Problem Relation Age of Onset  . Diabetes Mother   . Heart disease Brother   . Heart disease Brother   . Heart disease Brother   . Heart disease Brother   . Heart disease Brother   . Heart disease Brother   . Lung cancer Brother    Social History:  reports that he quit smoking about 47 years ago. His smoking use included Cigarettes. He has a 44.00 pack-year smoking history. He has never used smokeless tobacco. He reports that he does not drink alcohol or use drugs.  Allergies:  Allergies  Allergen Reactions  . Actos [Pioglitazone Hydrochloride] Other (See Comments)    Held 2012 bladder cancer  . Aspirin Other (See Comments)    Held 2012 due to hematuria and bruising.   . Ace Inhibitors Cough  . Bactrim Rash and Other (See Comments)    Rash, presumed allergy  . Gabapentin Nausea And Vomiting    Upset stomach and diarrhea - tolerates low doses  . Lyrica [Pregabalin] Swelling    swelling  . Pravastatin Swelling    Swelling of feet  . Sulfa Drugs Cross Reactors Rash    Medications Prior to Admission  Medication Sig Dispense Refill  . acetaminophen (TYLENOL) 500 MG tablet Take 1,000 mg by mouth every 8 (eight) hours as needed for mild pain, moderate pain or headache.    . albuterol (PROVENTIL) (2.5 MG/3ML) 0.083% nebulizer solution Take 3 mLs (2.5 mg total) by nebulization every 6 (six) hours as needed for  wheezing or shortness of breath. 150 mL 2  . amLODipine (NORVASC) 5 MG tablet Take 1 tablet (5 mg total) by mouth daily.    Marland Kitchen atorvastatin (LIPITOR) 10 MG tablet Take 1 tablet (10 mg total) by mouth daily. 90 tablet 3  . budesonide-formoterol (SYMBICORT) 160-4.5 MCG/ACT inhaler Inhale 2 puffs into the lungs 2 (two) times daily as needed (shortness of breath).     . clobetasol (TEMOVATE) 0.05 % external solution Apply 1 application topically daily.    . clopidogrel (PLAVIX) 75 MG tablet Take 1 tablet by mouth  daily 90 tablet 1  . furosemide (LASIX) 40 MG tablet TAKE 1 TABLET BY MOUTH  EVERY MONDAY, WEDNESDAY,  AND FRIDAY 39 tablet 1  . gabapentin (NEURONTIN) 100 MG capsule Take 100 mg by mouth 3 (three) times daily.    Marland Kitchen glipiZIDE (GLUCOTROL XL) 5 MG 24 hr tablet TAKE 1 TABLET BY MOUTH  DAILY WITH BREAKFAST 90 tablet 1  . insulin glargine (LANTUS) 100 UNIT/ML injection  Inject 0.15 mLs (15 Units total) into the skin daily. 10 mL   . insulin lispro (HUMALOG) 100 UNIT/ML injection Summary: Inject 5 Units into the skin 3 (three) times daily before meals. Use if sugar >150 before meal. If sugar <150 before meal, then skip a dose, 10 mL 2  . OxyCODONE HCl 7.5 MG TABA Take 7.5-15 mg by mouth every 6 (six) hours as needed (pain. sedation caution). 40 tablet 0  . polyethylene glycol (MIRALAX / GLYCOLAX) packet Take 17 g by mouth daily as needed. (Patient taking differently: Take 17 g by mouth daily as needed for mild constipation. ) 14 each 0  . tiotropium (SPIRIVA) 18 MCG inhalation capsule Place 1 capsule (18 mcg total) into inhaler and inhale daily. 90 capsule 3    Results for orders placed or performed during the hospital encounter of 10/18/16 (from the past 48 hour(s))  CBC     Status: None   Collection Time: 10/18/16 10:01 AM  Result Value Ref Range   WBC 9.0 4.0 - 10.5 K/uL   RBC 4.47 4.22 - 5.81 MIL/uL   Hemoglobin 14.3 13.0 - 17.0 g/dL   HCT 42.5 39.0 - 52.0 %   MCV 95.1 78.0 - 100.0 fL    MCH 32.0 26.0 - 34.0 pg   MCHC 33.6 30.0 - 36.0 g/dL   RDW 13.3 11.5 - 15.5 %   Platelets 297 150 - 400 K/uL  Comprehensive metabolic panel     Status: Abnormal   Collection Time: 10/18/16 10:01 AM  Result Value Ref Range   Sodium 138 135 - 145 mmol/L   Potassium 4.0 3.5 - 5.1 mmol/L   Chloride 97 (L) 101 - 111 mmol/L   CO2 29 22 - 32 mmol/L   Glucose, Bld 138 (H) 65 - 99 mg/dL   BUN 37 (H) 6 - 20 mg/dL   Creatinine, Ser 1.88 (H) 0.61 - 1.24 mg/dL   Calcium 9.2 8.9 - 10.3 mg/dL   Total Protein 7.2 6.5 - 8.1 g/dL   Albumin 3.8 3.5 - 5.0 g/dL   AST 18 15 - 41 U/L   ALT 12 (L) 17 - 63 U/L   Alkaline Phosphatase 73 38 - 126 U/L   Total Bilirubin 0.7 0.3 - 1.2 mg/dL   GFR calc non Af Amer 31 (L) >60 mL/min   GFR calc Af Amer 36 (L) >60 mL/min    Comment: (NOTE) The eGFR has been calculated using the CKD EPI equation. This calculation has not been validated in all clinical situations. eGFR's persistently <60 mL/min signify possible Chronic Kidney Disease.    Anion gap 12 5 - 15  Protime-INR     Status: None   Collection Time: 10/18/16 10:01 AM  Result Value Ref Range   Prothrombin Time 13.4 11.4 - 15.2 seconds   INR 1.02   APTT     Status: None   Collection Time: 10/18/16 10:01 AM  Result Value Ref Range   aPTT 32 24 - 36 seconds   No results found.  Review of Systems  Constitutional: Negative.   HENT: Negative.   Gastrointestinal: Negative.   Genitourinary: Negative.   Musculoskeletal: Positive for back pain.  Skin: Negative.   Neurological: Positive for tingling.  Psychiatric/Behavioral: Negative.     Height _0  (1.88 m), weight 193 lb (87.5 kg). Physical Exam  Constitutional: He is oriented to person, place, and time. No distress.  HENT:  Head: Normocephalic and atraumatic.  Eyes: EOM are normal. Pupils are  equal, round, and reactive to light.  Neck: Normal range of motion.  GI: He exhibits no distension.  Musculoskeletal: He exhibits tenderness.   Neurological: He is alert and oriented to person, place, and time.  Skin: Skin is warm and dry.  Psychiatric: He has a normal mood and affect.        CLINICAL DATA:  Bilateral sciatica  EXAM: MRI LUMBAR SPINE WITHOUT CONTRAST  TECHNIQUE: Multiplanar, multisequence MR imaging of the lumbar spine was performed. No intravenous contrast was administered.  COMPARISON:  None.  FINDINGS: Segmentation:  Standard.  Alignment:  Slight L5-S1 retrolisthesis.  Vertebrae: Chronic appearing Schmorl's node in the T12 inferior endplate.  Conus medullaris: Extends to the L2 level and appears normal.  Paraspinal and other soft tissues: There is a lobulated fluid intensity mass left periaortic measuring up to 23 mm with taillike extension behind the aorta. This is new from 2012 but has a benign lymphatic/cystic appearance.  Disc levels:  T12- L1: Unremarkable.  L1-L2: Expected disc narrowing and bulging. Mild facet spurring. No impingement  L2-L3: Expected disc narrowing and bulging. Mild facet spurring. No impingement.  L3-L4: Disc narrowing and bulging. Mild facet arthropathy. No impingement.  L4-L5:Disc narrowing and bulging with small right foraminal protrusion. Advanced facet arthropathy with hypertrophy, interspinous bursa, and bilateral facet/ligamentum flavum degenerative cysts - both measuring up to 6 mm. Left-sided cyst is more notable, causing L5 compression in the subarticular recess. Right subarticular recess stenosis is more mild. Canal stenosis is overall moderate. Moderate bilateral foraminal narrowing.  L5-S1: Greatest level degenerative disc narrowing with endplate ridging and spurring. Small down turning central disc protrusion without impingement.  IMPRESSION: 1. L4-5 advanced facet arthropathy with signs of chronic motion. Moderate canal stenosis with left more than right subarticular recess stenosis due to synovial cysts/ganglia. The  left L5 nerve is directly compressed by the left-sided cyst. Moderate foraminal narrowing, greater on the right due to disc protrusion. 2. Noncompressive degenerative changes described above. 3. 23 mm cystic appearing retroperitoneal mass at the level of L2. This has a benign appearance most suggestive of lymphocele, but was not seen on 2012 abdominal CT. Correlate for any interval retroperitoneal intervention and consider 6-12 month follow-up abdominal CT in this patient with history of bladder cancer.  Assessment/Plan L4-5 stenosis and synovial cyst  We'll proceed with LEFT AND CENTRAL L4-5 LUMBAR LAMINECTOMY WITH RESECTION OF SYNOVIAL CYST as scheduled. Surgical procedure along with potential rehabilitation/recovery time discussed. All questions answered.  Benjiman Core, PA-C 10/18/2016, 11:40 AM  Patient examined and lab reviewed with Ricard Dillon, PA-C.

## 2016-10-18 NOTE — Anesthesia Preprocedure Evaluation (Signed)
Anesthesia Evaluation  Patient identified by MRN, date of birth, ID band Patient awake    History of Anesthesia Complications Negative for: history of anesthetic complications  Airway Mallampati: II  TM Distance: >3 FB Neck ROM: Full    Dental  (+) Edentulous Upper, Edentulous Lower   Pulmonary shortness of breath, former smoker,    breath sounds clear to auscultation       Cardiovascular hypertension, + CAD, + Peripheral Vascular Disease, +CHF and + DOE   Rhythm:Regular Rate:Normal     Neuro/Psych PSYCHIATRIC DISORDERS    GI/Hepatic   Endo/Other  diabetes  Renal/GU Renal disease     Musculoskeletal  (+) Arthritis ,   Abdominal   Peds  Hematology   Anesthesia Other Findings COPD DM HTN; last echo 55% EF. No Hx of CHF since. Patient and family feel state of health is similar since last foot surgery. No rales  edentulous  Reproductive/Obstetrics                            Anesthesia Physical  Anesthesia Plan  ASA: III  Anesthesia Plan: General   Post-op Pain Management:    Induction: Intravenous  Airway Management Planned: Oral ETT  Additional Equipment:   Intra-op Plan:   Post-operative Plan: Extubation in OR  Informed Consent: I have reviewed the patients History and Physical, chart, labs and discussed the procedure including the risks, benefits and alternatives for the proposed anesthesia with the patient or authorized representative who has indicated his/her understanding and acceptance.   Dental advisory given  Plan Discussed with:   Anesthesia Plan Comments:         Anesthesia Quick Evaluation

## 2016-10-18 NOTE — Transfer of Care (Signed)
Immediate Anesthesia Transfer of Care Note  Patient: [name] Small  Procedure(s) Performed: Procedure(s): LEFT AND CENTRAL L4-5 LUMBAR LAMINECTOMY WITH RESECTION OF SYNOVIAL CYST (N/A)  Patient Location: PACU  Anesthesia Type:General  Level of Consciousness: awake, oriented and patient cooperative  Airway & Oxygen Therapy: Patient Spontanous Breathing and Patient connected to face mask oxygen  Post-op Assessment: Report given to RN and Post -op Vital signs reviewed and stable  Post vital signs: Reviewed  Last Vitals: There were no vitals filed for this visit.  Last Pain:  Vitals:   10/18/16 1015  PainSc: 6       Patients Stated Pain Goal: 7 (76/70/11 0034)  Complications: No apparent anesthesia complications

## 2016-10-18 NOTE — Anesthesia Procedure Notes (Signed)
Procedure Name: Intubation Date/Time: 10/18/2016 12:52 PM Performed by: Jenne Campus Pre-anesthesia Checklist: Patient identified, Emergency Drugs available, Suction available, Patient being monitored and Timeout performed Patient Re-evaluated:Patient Re-evaluated prior to inductionOxygen Delivery Method: Circle System Utilized Preoxygenation: Pre-oxygenation with 100% oxygen Intubation Type: IV induction Ventilation: Mask ventilation without difficulty and Oral airway inserted - appropriate to patient size Laryngoscope Size: Miller and 3 Grade View: Grade I Tube type: Oral Tube size: 7.5 mm Number of attempts: 1 Airway Equipment and Method: Stylet and Oral airway Placement Confirmation: ETT inserted through vocal cords under direct vision,  positive ETCO2 and breath sounds checked- equal and bilateral Secured at: 23 cm Tube secured with: Tape Dental Injury: Teeth and Oropharynx as per pre-operative assessment

## 2016-10-18 NOTE — Anesthesia Postprocedure Evaluation (Signed)
Anesthesia Post Note  Patient: SYD NEWSOME  Procedure(s) Performed: Procedure(s) (LRB): LEFT AND CENTRAL L4-5 LUMBAR LAMINECTOMY WITH RESECTION OF SYNOVIAL CYST (N/A)  Patient location during evaluation: PACU Anesthesia Type: General Level of consciousness: awake and alert Pain management: pain level controlled Vital Signs Assessment: post-procedure vital signs reviewed and stable Respiratory status: spontaneous breathing, nonlabored ventilation, respiratory function stable and patient connected to nasal cannula oxygen Cardiovascular status: blood pressure returned to baseline and stable Postop Assessment: no signs of nausea or vomiting Anesthetic complications: no    Last Vitals:  Vitals:   10/18/16 1620 10/18/16 1635  BP: 125/68 113/68  Pulse: 79 80  Resp: 11 13  Temp:      Last Pain:  Vitals:   10/18/16 1620  PainSc: Asleep                 Catalina Gravel

## 2016-10-18 NOTE — Op Note (Signed)
10/18/2016  2:52 PM  PATIENT:  [name] Small  82 y.o. male  MRN: [id number]  OPERATIVE REPORT  PRE-OPERATIVE DIAGNOSIS:  left and central spinal stenosis L4-5, Left L4-5 synovial cyst  POST-OPERATIVE DIAGNOSIS:  left and central spinal stenosis L4-5, Left L4-5 synovial cyst  PROCEDURE:  Procedure(s): LEFT AND CENTRAL L4-5 LUMBAR LAMINECTOMY WITH RESECTION OF SYNOVIAL CYST    SURGEON:  Jessy Oto, MD     ASSISTANT:  Benjiman Core, PA-C  (Present throughout the entire procedure and necessary for completion of procedure in a timely manner)     ANESTHESIA:  General,supplemented with local anesthesia marcaine 0.5% 1:1 exparel 1.3% total 20CC Dr. Lyndle Herrlich.  EBL: 13KG    COMPLICATIONS:  None.  DRAINS: None.      PROCEDURE:The patient was met in the holding area, and the appropriate left Lumbar level L4-5 identified and marked with "x" and my initials.The patient was then transported to OR and was placed under general anesthesia without difficulty. The patient received appropriate preoperative antibiotic prophylaxis.The patient after intubation atraumatically was transferred to the operating room table, prone position, Wilson frame, sliding OR table. All pressure points were well padded. The arms in 90-90 well-padded at the elbows. Standard prep with DuraPrep solution lower dorsal spine to the mid sacral segment. Draped in the usual manner iodine Vi-Drape was used. Time-out procedure was called and correct. An  18-gauge spinal needle was then inserted at the expected L4 level. The needle was at the lower aspect of the lamina of L5 Skin superior to this was then infiltrated with  local anesthesia marcaine 0.5% 1:1 exparel 1.3% total 20CC . An incision approximately an inch inch and a half in length was then made through skin and subcutaneous layers in line with the left side of the expected midline just superior to the spinal needle entry point. An incision made into the right lumbosacral  fascia approximately an inch in length .  Cobb elevator was then introduced into the incision site and used to carefully form subperiosteal movement of the left paralumbar muscles off of the posterior lamina of the expected L4-5 level. The depth measured off of the elevator at about 60 mm and 60 mm retractors and placed on the scaffolding for the Boss Mccollough retractor and guided down to and docking on the posterior aspect of the lamina at the expected L4-5 level. This was sterilely attached to the articulating arm and it's up right which had been attached the OR table sterilely. Lateral radiograph identified a Penfield #4 at the appropriate level L4-5. The operating room microscope sterilely draped brought into the field. Under the operating room microscope, the left L4-5 interspace carefully debrided the small amount of muscle attachment here and high-speed bur used to drill the medial aspect of the inferior articular process of L4 approximately 10%.Using the 3 mm Kerrison to debrided the ligamentum flavum attachment. Foraminotomy was then performed over the L5 nerve root. The medial 10% superior articular process of L5 and then resected using an osteotome and 2 mm Kerrison. This allowed for identification of the thecal sac. Penfield 4 was then used to carefully mobilize the thecal sac medially and the L5 nerve root identified within the lateral recess flattened over the posterior aspect of the L4-5 disc. The ligamentum flavum then removed off the ventral aspect of the inferior left L4 lamina. Carefully the lateral aspect of the L5 nerve root was identified and a Penfield 4 was used to mobilize the nerve medially such  that the synovial cyst was visible with microscope. Using a Penfield 4 for dissection the medial facet bone attached ligamentum flavum and synovial cyst where then removed and sent for pathology evaluation. Then the thecal sac was retracted using a Derricho retractor. Further foraminotomies was  performed over the leftL4 nerve root the nerve root was noted to be decompressed. The L5 nerve root able to be retracted along the medial aspect of the L5 pedicle. Ligamentum flavum was further debrided superiorly to the level L4-5 disc. Had a moderate amount of further resection of the L5 lamina inferiorly was performed.  Ligamentum flavum was debrided and lateral recess along the medial aspect L4-5 facet no further decompression was necessary. Ball tip nerve probe was then able to carefully palpate the neuroforamen for L4 and L5 finding these to be well decompressed. Attention then turned to the central and right L4-5 level which was easily visualized with the microscope. Soft tissues debrided about the posterior aspect of the left medial L4-5  Interspace laminotomy. High-speed bur and then used to carefully drill ventral left spinous process left side L4 lamina. The superior margin of the L4 lamina then carefully debrided with 3 mm kerrison then used to enter the spinal canal over the superior aspect of the thecal sac resecting bone over the superior aspect and freeing up the attachment of ligamentum flavum here. Ligamentum flavum then debrided with the 2 mm and 3 mm Kerrisons we decompressed the central L4-5 level Venous bleeding encountered. Thrombin-soaked Gelfoam used to control this following this then the sac and the L5 nerve root were mobilized medially.Irrigation was carried out then bleeding controlled with Gelfoam. Gelfoam was then removed. Irrigation carried careful examination demonstrated no active bleeding present. Retractors were then carefully removed Since carefully then the Bleeding was then controlled using thrombin-soaked Gelfoam small cottonoids. Small amount of bleeding within the soft tissue mass the laminotomy area was controlled using bipolar electrocautery. Irrigation was carried out using copious amounts of irrigant solution. All Gelfoam were then removed. No significant active bleeding  present at the time of removal. All instruments sponge counts were correct traction system was then carefully removed carefully rotating retractors with this withdrawal and only bipolar electrocautery of any small bleeders. Lumbodorsal fascia was then carefully approximated with interrupted 0 Vicryl sutures, UR 6 needle deep subcutaneous layers were approximated with interrupted 0 Vicryl sutures on UR 6 the appear subcutaneous layers approximated with interrupted 2-0 Vicryl sutures and the skin closed with a running subcutaneous stitch of 4-0 Vicryl. Dermabond was applied allowed to dry and then Mepilex bandage applied. Patient was then carefully returned to supine position on a stretcher, reactivated and extubated. He was then returned to recovery room in satisfactory condition.  Benjiman Core, PA-C perform the duties of assistant surgeon during this case. he was present from the beginning of the case to the end of the case assisting in transfer the patient from his stretcher to the OR table and back to the stretcher at the end of the case. Assisted in careful retraction and suction of the laminectomy site delicate neural structures operating under the operating room microscope. He performed closure of the incision from the fascia to the skin applying the dressing.    SPECIMEN: Left medial facectomy bone and ligamentum flavum with attached synovial cyst.     Jessy Oto  10/18/2016, 2:52 PM

## 2016-10-18 NOTE — Brief Op Note (Signed)
10/18/2016  2:32 PM  PATIENT:  [name] Small  82 y.o. male  PRE-OPERATIVE DIAGNOSIS:  left and central spinal stenosis L4-5, Left L4-5 synovial cyst  POST-OPERATIVE DIAGNOSIS:  left and central spinal stenosis L4-5, Left L4-5 synovial cyst  PROCEDURE:  Procedure(s): LEFT AND CENTRAL L4-5 LUMBAR LAMINECTOMY WITH RESECTION OF SYNOVIAL CYST (N/A)  SURGEON:  Surgeon(s) and Role:    * Jessy Oto, MD - Primary  PHYSICIAN ASSISTANT: Benjiman Core, PA-C  ANESTHESIA:   local and general, Dr. Lyndle Herrlich.  EBL:  Total I/O In: 1250 [I.V.:1250] Out: -   BLOOD ADMINISTERED:none  DRAINS: none   LOCAL MEDICATIONS USED:  MARCAINE 0.5% 1:1 EXPAREL 1.3%Amount: 20 ml  SPECIMEN:  Source of Specimen:  Left L4-5 partial facectetomy with synovial cyst.  DISPOSITION OF SPECIMEN:  PATHOLOGY  COUNTS:  YES  DICTATION: .Dragon Dictation  PLAN OF CARE: Admit for overnight observation  PATIENT DISPOSITION:  PACU - hemodynamically stable.   Delay start of Pharmacological VTE agent (>24hrs) due to surgical blood loss or risk of bleeding: yes

## 2016-10-18 NOTE — Discharge Instructions (Signed)
° ° °  No lifting greater than 10 lbs. °Avoid bending, stooping and twisting. °Walk in house for first week them may start to get out slowly increasing distance up to one quarter mile by 3 weeks post op. °Keep incision dry for 3 days, may use tegaderm or similar water impervious dressing. ° °

## 2016-10-18 NOTE — Interval H&P Note (Signed)
History and Physical Interval Note:  10/18/2016 12:42 PM  [name] Small  has presented today for surgery, with the diagnosis of left and central spinal stenosis L4-5, Left L4-5 synovial cyst  The various methods of treatment have been discussed with the patient and family. After consideration of risks, benefits and other options for treatment, the patient has consented to  Procedure(s): LEFT AND CENTRAL L4-5 LUMBAR LAMINECTOMY WITH RESECTION OF SYNOVIAL CYST (N/A) as a surgical intervention .  The patient's history has been reviewed, patient examined, no change in status, stable for surgery.  I have reviewed the patient's chart and labs.  Questions were answered to the patient's satisfaction.     Jessy Oto

## 2016-10-19 ENCOUNTER — Encounter (HOSPITAL_COMMUNITY): Payer: Self-pay | Admitting: Specialist

## 2016-10-19 DIAGNOSIS — M48062 Spinal stenosis, lumbar region with neurogenic claudication: Secondary | ICD-10-CM | POA: Diagnosis not present

## 2016-10-19 LAB — BASIC METABOLIC PANEL
Anion gap: 9 (ref 5–15)
BUN: 33 mg/dL — ABNORMAL HIGH (ref 6–20)
CO2: 30 mmol/L (ref 22–32)
Calcium: 8.4 mg/dL — ABNORMAL LOW (ref 8.9–10.3)
Chloride: 99 mmol/L — ABNORMAL LOW (ref 101–111)
Creatinine, Ser: 1.92 mg/dL — ABNORMAL HIGH (ref 0.61–1.24)
GFR calc Af Amer: 35 mL/min — ABNORMAL LOW (ref >60)
GFR calc non Af Amer: 30 mL/min — ABNORMAL LOW (ref >60)
Glucose, Bld: 124 mg/dL — ABNORMAL HIGH (ref 65–99)
Potassium: 4.3 mmol/L (ref 3.5–5.1)
Sodium: 138 mmol/L (ref 135–145)

## 2016-10-19 LAB — HEMOGLOBIN A1C
Hgb A1c MFr Bld: 10 % — ABNORMAL HIGH (ref 4.8–5.6)
Mean Plasma Glucose: 240 mg/dL

## 2016-10-19 LAB — CBC
HCT: 38.2 % — ABNORMAL LOW (ref 39.0–52.0)
Hemoglobin: 12.6 g/dL — ABNORMAL LOW (ref 13.0–17.0)
MCH: 31.6 pg (ref 26.0–34.0)
MCHC: 33 g/dL (ref 30.0–36.0)
MCV: 95.7 fL (ref 78.0–100.0)
Platelets: 260 10*3/uL (ref 150–400)
RBC: 3.99 MIL/uL — ABNORMAL LOW (ref 4.22–5.81)
RDW: 13.2 % (ref 11.5–15.5)
WBC: 9.2 10*3/uL (ref 4.0–10.5)

## 2016-10-19 LAB — GLUCOSE, CAPILLARY
Glucose-Capillary: 133 mg/dL — ABNORMAL HIGH (ref 65–99)
Glucose-Capillary: 271 mg/dL — ABNORMAL HIGH (ref 65–99)

## 2016-10-19 NOTE — Discharge Summary (Signed)
Physician Discharge Summary      Patient ID: [name] Small MRN: [id number] DOB/AGE: 1927/11/22 82 y.o.  Admit date: 10/18/2016 Discharge date: 10/19/2016  Admission Diagnoses:  Subacute left lumbar radiculopathy  Discharge Diagnoses:  Principal Problem:   Subacute left lumbar radiculopathy Active Problems:   Spinal stenosis of lumbar region with neurogenic claudication   Past Medical History:  Diagnosis Date  . Arthritis   . Back pain    s/p lumbar injection 2014  . BENIGN PROSTATIC HYPERTROPHY 05/21/2007  . Bladder cancer ([place]) 10/2011  . CAD (coronary artery disease) CARDIOLOGIST- DR WALL - VISIT IN JUNE 2012 FOR CATH   nonobstructive by cath 6/12:  mid LAD 30%, proximal obtuse marginal-2 30%, proximal RCA 20%, mid RCA 30-40%.  He had normal cardiac output and mildly elevated filling pressures but no significant pulmonary hypertension;   Echocardiogram in May 2012 demonstrated EF 50-55% and left atrial enlargement   . Cellulitis of left foot    Hospitalized in 2006  . CHF (congestive heart failure) ([place])    per family, issues in 2012  . Chronic kidney disease (CKD), stage III (moderate) 12/04/2007   FOLLOWED BY DR PATEL  . Chronic pain of lower extremity   . Complication of anesthesia    1 time bladder cancer surgery- 2012 , oxygen satruratioon dropped had to stay overnight, no problems since then.  Marland Kitchen COPD (chronic obstructive pulmonary disease) ([place])   . DIABETES MELLITUS, TYPE II 05/21/2007  . DIVERTICULOSIS, COLON 05/21/2007   pt denies  . Dyspnea    with exertion, patient mointors saturation  . Fatigue AGE-RELATED  . Gross hematuria    pt denies  . HYPERTENSION 05/21/2007  . Impaired hearing BILATERAL HEARING AIDS  . On home oxygen therapy    "2L at nighttime" (02/28/2016)  . PAD (peripheral artery disease) ([place])   . Pneumonia    Hx  of   . Psoriasis ELBOWS  . Psoriasis   . PSORIASIS, SCALP 10/25/2008    Surgeries: Procedure(s): LEFT AND CENTRAL L4-5  LUMBAR LAMINECTOMY WITH RESECTION OF SYNOVIAL CYST on 10/18/2016   Consultants (if any):   Discharged Condition: Improved  Hospital Course: [name] Small is an 82 y.o. male who was admitted 10/18/2016 with a diagnosis of Subacute left lumbar radiculopathy and went to the operating room on 10/18/2016 and underwent the above named procedures.    He was given perioperative antibiotics:  Anti-infectives    Start     Dose/Rate Route Frequency Ordered Stop   10/18/16 2100  ceFAZolin (ANCEF) IVPB 1 g/50 mL premix     1 g 100 mL/hr over 30 Minutes Intravenous Every 8 hours 10/18/16 1749 10/19/16 0648   10/18/16 0945  ceFAZolin (ANCEF) IVPB 2g/100 mL premix     2 g 200 mL/hr over 30 Minutes Intravenous On call to O.R. 10/18/16 0945 10/18/16 1254    .  He was given sequential compression devices, early ambulation for DVT prophylaxis.  He benefited maximally from the hospital stay and there were no complications.    Recent vital signs:  Vitals:   10/19/16 0058 10/19/16 0630  BP: (!) 109/51 (!) 112/49  Pulse: 76 76  Resp: 16 16  Temp: 98 F (36.7 C) 99.5 F (37.5 C)    Recent laboratory studies:  Lab Results  Component Value Date   HGB 12.6 (L) 10/19/2016   HGB 14.3 10/18/2016   HGB 13.2 06/21/2016   Lab Results  Component Value Date   WBC 9.2 10/19/2016  PLT 260 10/19/2016   Lab Results  Component Value Date   INR 1.02 10/18/2016   Lab Results  Component Value Date   NA 138 10/19/2016   K 4.3 10/19/2016   CL 99 (L) 10/19/2016   CO2 30 10/19/2016   BUN 33 (H) 10/19/2016   CREATININE 1.92 (H) 10/19/2016   GLUCOSE 124 (H) 10/19/2016    Discharge Medications:     Diagnostic Studies: Dg Lumbar Spine 2-3 Views  Result Date: 10/18/2016 CLINICAL DATA:  L4-5 laminectomy. EXAM: LUMBAR SPINE - 2-3 VIEW COMPARISON:  Lumbar spine MRI 08/28/2016. FINDINGS: Three intraoperative lumbar spine images are submitted. A needle projects over the L5 spinous process on the first  image. A surgical probe is centered at the L4 spinous process on the second image. The third image demonstrates a surgical probe centered at the L5 spinous process. IMPRESSION: 1. Intraoperative localization of the L5 spinous process. Electronically Signed   By: San Morelle M.D.   On: 10/18/2016 14:07    Disposition: 01-Home or Self Care  Discharge Instructions    Call MD / Call 911    Complete by:  As directed    If you experience chest pain or shortness of breath, CALL 911 and be transported to the hospital emergency room.  If you develope a fever above 101 F, pus (white drainage) or increased drainage or redness at the wound, or calf pain, call your surgeon's office.   Call MD / Call 911    Complete by:  As directed    If you experience chest pain or shortness of breath, CALL 911 and be transported to the hospital emergency room.  If you develope a fever above 101.5 F, pus (white drainage) or increased drainage or redness at the wound, or calf pain, call your surgeon's office.   Constipation Prevention    Complete by:  As directed    Drink plenty of fluids.  Prune juice may be helpful.  You may use a stool softener, such as Colace (over the counter) 100 mg twice a day.  Use MiraLax (over the counter) for constipation as needed.   Constipation Prevention    Complete by:  As directed    Drink plenty of fluids.  Prune juice may be helpful.  You may use a stool softener, such as Colace (over the counter) 100 mg twice a day.  Use MiraLax (over the counter) for constipation as needed.   Diet Carb Modified    Complete by:  As directed    Discharge instructions    Complete by:  As directed    No lifting greater than 10 lbs. Avoid bending, stooping and twisting. Walk in house for first week them may start to get out slowly increasing distance up to one quarter mile by 3 weeks post op. Keep incision dry for 3 days, may use tegaderm or similar water impervious dressing.   Driving  restrictions    Complete by:  As directed    No driving for 3 weeks   Driving restrictions    Complete by:  As directed    No driving while taking narcotic pain meds.   Increase activity slowly as tolerated    Complete by:  As directed    Increase activity slowly as tolerated    Complete by:  As directed    Lifting restrictions    Complete by:  As directed    No lifting for 6 weeks      Follow-up Information  Jessy Oto, MD In 2 weeks.   Specialty:  Orthopedic Surgery Why:  For wound re-check Contact information: [place] [address] [phone]            Signed: Eduard Roux 10/19/2016, 10:00 AM

## 2016-10-19 NOTE — Progress Notes (Signed)
Physical Therapy Treatment Patient Details Name: [name] Small MRN: [id number] DOB: 02-17-1928 Today's Date: 10/19/2016    History of Present Illness Pt adm for LEFT AND CENTRAL L4-5 LUMBAR LAMINECTOMY WITH RESECTION OF SYNOVIAL CYST  PMHx- COPD, DM, bladder Ca, bil hearing aids, Lt transmetatarsal amputation 8/17    PT Comments    See comments below re: family education for stairs.  Follow Up Recommendations  Home health PT;Supervision for mobility/OOB     Equipment Recommendations  None recommended by PT    Recommendations for Other Services       Precautions / Restrictions Precautions Precautions: Fall;Back Precaution Booklet Issued: Yes (comment) Precaution Comments: Reviewed with pt and wife Required Braces or Orthoses:  (order for no brace)    Mobility  Bed Mobility                Transfers            Ambulation/Gait       Stairs Stairs: Yes   Stair Management: One rail Left;Step to pattern;Sideways Number of Stairs: 2 General stair comments: son-in-law arrived and discussed possible use of w/c for up/down steps, however he reports the entrance is too narrow for his w/c to fit. Educated on [place] for ascending with strong leg (Rt) and descending with weaker leg leading and be careful to have left leg in extension as it "catches" him as strong leg lowers him. Discussed facing rail for bil UEs on the rail for incr support. He was appreciative of tips as they had not been performing this way. Offered to take patient and son-in-law to the stairs to practice and they declined.   Wheelchair Mobility    Modified Rankin (Stroke Patients Only)       Balance                          Cognition Arousal/Alertness: Awake/alert Behavior During Therapy: Restless;Anxious Overall Cognitive Status: Within Functional Limits for tasks assessed                      Exercises      General Comments General comments (skin  integrity, edema, etc.): MD notified of earlier stair incident and stated OK for pt to discharge. RN aware      Pertinent Vitals/Pain Pain Assessment: Faces Faces Pain Scale: Hurts even more Pain Location: Left lower leg Pain Descriptors / Indicators: Burning (neuropathic) Pain Intervention(s): Limited activity within patient's tolerance;Premedicated before session;Repositioned    Home Living Family/patient expects to be discharged to:: Private residence Living Arrangements: Spouse/significant other Available Help at Discharge: Family;Available 24 hours/day Type of Home: House Home Access: Stairs to enter Entrance Stairs-Rails: Left Home Layout: One level Home Equipment: Walker - 2 wheels;Cane - single point;Bedside commode;Wheelchair - manual      Prior Function Level of Independence: Needs assistance  Gait / Transfers Assistance Needed: uses RW at home; takes w/c in community; assist on stairs ADL's / Homemaking Assistance Needed: wife assists with shower     PT Goals (current goals can now be found in the care plan section) Acute Rehab PT Goals Patient Stated Goal: Go home today PT Goal Formulation: All assessment and education complete, DC therapy    Frequency           PT Plan Current plan remains appropriate    Co-evaluation             End of Session Equipment Utilized During Treatment:  Gait belt Activity Tolerance: Patient limited by fatigue Patient left: in chair;with nursing/sitter in room;with family/visitor present     Time: [phone] (no charge) PT Time Calculation (min) (ACUTE ONLY): 5 min  Charges:  $Gait Training: 8-22 mins                    G Codes:  Functional Assessment Tool Used: clinical judgement Functional Limitation: Mobility: Walking and moving around Mobility: Walking and Moving Around Current Status [phone]): At least 20 percent but less than 40 percent impaired, limited or restricted Mobility: Walking and Moving Around Goal  Status [phone]): At least 20 percent but less than 40 percent impaired, limited or restricted Mobility: Walking and Moving Around Discharge Status [phone]): At least 20 percent but less than 40 percent impaired, limited or restricted   Rexanne Mano 10/19/2016, 12:03 PM No charge

## 2016-10-19 NOTE — Progress Notes (Signed)
D/c istructions given, home now via wc w/s[pouse and grown children.

## 2016-10-19 NOTE — Evaluation (Signed)
Physical Therapy Evaluation Patient Details Name: [name] Small MRN: [id number] DOB: Dec 11, 1927 Today's Date: 10/19/2016   History of Present Illness  Pt adm for LEFT AND CENTRAL L4-5 LUMBAR LAMINECTOMY WITH RESECTION OF SYNOVIAL CYST  PMHx- COPD, DM, bladder Ca, bil hearing aids, Lt transmetatarsal amputation 8/17    Clinical Impression  Patient is s/p above surgery resulting in the deficits listed below (see PT Problem List).  Patient will benefit from skilled PT to increase their independence and safety with mobility (while adhering to their precautions).   While performing stair training, pt was lowered to sit on top step due to Lt knee buckling as he began to descend step. Patient denies injury. RN aware and to further assess pt. Discussed with wife and son-in-law assists pt up/down steps. Discussed option of using pt's wheelchair to increase safety and provided handout. Pt, wife, and RN aware that PT would like to review steps with son-in-law when he arrives.     Follow Up Recommendations Home health PT;Supervision for mobility/OOB    Equipment Recommendations  None recommended by PT    Recommendations for Other Services       Precautions / Restrictions Precautions Precautions: Fall;Back Precaution Booklet Issued: Yes (comment) Precaution Comments: Reviewed with pt and wife Required Braces or Orthoses:  (order for no brace)      Mobility  Bed Mobility               General bed mobility comments: OOB with nursing; wife reports he sleeps in lift chair  Transfers Overall transfer level: Needs assistance Equipment used: Rolling walker (2 wheeled) Transfers: Sit to/from Stand Sit to Stand: Min assist;Min guard         General transfer comment: from recliner minguard (proper technique);  from transport chair min assist (only one armrest)  Ambulation/Gait Ambulation/Gait assistance: Min assist Ambulation Distance (Feet): 70 Feet Assistive device: Rolling  walker (2 wheeled) Gait Pattern/deviations: Step-to pattern;Decreased step length - right;Decreased stance time - left;Trunk flexed   Gait velocity interpretation: Below normal speed for age/gender General Gait Details: patient clearing lt foot (wife reports he was sliding it prior to surgery); incr reliance on UEs due to LLE weakness in stance  Stairs Stairs: Yes   Stair Management: One rail Left;Step to pattern;Sideways Number of Stairs: 2 General stair comments: pt attempting to ascend with LLE first and educated to lead with strong leg; ascended two steps with min assist for balance; descended one short step (leading with LLE) and lt knee buckled; despite use of his UEs on rail and PT using gait belt to try to return to standing, pt slowly lowered to sit on top step; multiple nurses in to assist with standing pt using bil rails  Wheelchair Mobility    Modified Rankin (Stroke Patients Only)       Balance Overall balance assessment: Needs assistance;History of Falls Sitting-balance support: No upper extremity supported;Feet supported Sitting balance-Leahy Scale: Good     Standing balance support: Bilateral upper extremity supported Standing balance-Leahy Scale: Poor Standing balance comment: flexed posture; incr reliance on UEs                             Pertinent Vitals/Pain Pain Assessment: Faces Faces Pain Scale: Hurts even more Pain Location: Left lower leg Pain Descriptors / Indicators: Burning (neuropathic) Pain Intervention(s): Limited activity within patient's tolerance;Premedicated before session;Repositioned    Home Living Family/patient expects to be discharged to:: Private residence  Living Arrangements: Spouse/significant other Available Help at Discharge: Family;Available 24 hours/day Type of Home: House Home Access: Stairs to enter Entrance Stairs-Rails: Left Entrance Stairs-Number of Steps: 4 Home Layout: One level Home Equipment: Walker -  2 wheels;Cane - single point;Bedside commode;Wheelchair - manual      Prior Function Level of Independence: Needs assistance   Gait / Transfers Assistance Needed: uses RW at home; takes w/c in community; assist on stairs  ADL's / Homemaking Assistance Needed: wife assists with shower        Hand Dominance        Extremity/Trunk Assessment   Upper Extremity Assessment: Defer to OT evaluation           Lower Extremity Assessment: RLE deficits/detail;LLE deficits/detail RLE Deficits / Details: AROM WFL; knee extension 4/5 (uses lift chair at home--discussed detrimental effects on leg strength LLE Deficits / Details: AROM WFL; transmetatarsal amputation; knee extension 3/5  Cervical / Trunk Assessment: Other exceptions;Kyphotic  Communication   Communication: No difficulties  Cognition Arousal/Alertness: Awake/alert Behavior During Therapy: Restless;Anxious Overall Cognitive Status: Within Functional Limits for tasks assessed                      General Comments General comments (skin integrity, edema, etc.): Manuela Schwartz, RN notified of incident on stairs and will check on pt    Exercises     Assessment/Plan    PT Assessment Patient needs continued PT services  PT Problem List Decreased strength;Decreased activity tolerance;Decreased balance;Decreased mobility;Decreased knowledge of precautions;Impaired sensation;Pain          PT Treatment Interventions      PT Goals (Current goals can be found in the Care Plan section)  Acute Rehab PT Goals Patient Stated Goal: Go home today PT Goal Formulation: All assessment and education complete, DC therapy    Frequency     Barriers to discharge Other (comment) Weak on stairs; wife/pt and RN aware PT to review with son-in-law when he arrives    Co-evaluation               End of Session Equipment Utilized During Treatment: Gait belt Activity Tolerance: Patient limited by fatigue Patient left: in  chair;with call bell/phone within reach;with family/visitor present Nurse Communication: Mobility status;Other (comment) (lowered to sit on steps )    Functional Assessment Tool Used: clinical judgement Functional Limitation: Mobility: Walking and moving around Mobility: Walking and Moving Around Current Status [phone]): At least 20 percent but less than 40 percent impaired, limited or restricted Mobility: Walking and Moving Around Goal Status [phone]): At least 20 percent but less than 40 percent impaired, limited or restricted Mobility: Walking and Moving Around Discharge Status [phone]): At least 20 percent but less than 40 percent impaired, limited or restricted    Time: 0930-1030 (charges adjusted to reflect therapy time) PT Time Calculation (min) (ACUTE ONLY): 60 min   Charges:   PT Evaluation $PT Eval High Complexity: 1 Procedure PT Treatments $Gait Training: 8-22 mins   PT G Codes:   PT G-Codes **NOT FOR INPATIENT CLASS** Functional Assessment Tool Used: clinical judgement Functional Limitation: Mobility: Walking and moving around Mobility: Walking and Moving Around Current Status ([id number]): At least 20 percent but less than 40 percent impaired, limited or restricted Mobility: Walking and Moving Around Goal Status [phone]): At least 20 percent but less than 40 percent impaired, limited or restricted Mobility: Walking and Moving Around Discharge Status [phone]): At least 20 percent but less than 40 percent impaired, limited  or restricted    [employer] 10/19/2016, 11:08 AM Pager [phone]

## 2016-10-19 NOTE — Progress Notes (Signed)
   Subjective:  Patient reports pain as mild.    Objective:   VITALS:   Vitals:   10/18/16 2100 10/19/16 0058 10/19/16 0630 10/19/16 0921  BP: (!) 121/51 (!) 109/51 (!) 112/49   Pulse: 86 76 76   Resp:  16 16   Temp:  98 F (36.7 C) 99.5 F (37.5 C)   TempSrc:  Oral Oral   SpO2: 92% 90% 94% 95%  Weight:      Height:        Neurologically intact Neurovascular intact Sensation intact distally Intact pulses distally Dorsiflexion/Plantar flexion intact   Lab Results  Component Value Date   WBC 9.2 10/19/2016   HGB 12.6 (L) 10/19/2016   HCT 38.2 (L) 10/19/2016   MCV 95.7 10/19/2016   PLT 260 10/19/2016     Assessment/Plan:  1 Day Post-Op   - Expected postop acute blood loss anemia - will monitor for symptoms - Up with PT/OT - DVT ppx - SCDs, ambulation - UAT - home today  Eduard Roux 10/19/2016, 9:59 AM [phone]

## 2016-10-19 NOTE — Care Management Note (Signed)
Case Management Note  Patient Details  Name: RENDELL THIVIERGE MRN: [id number] Date of Birth: December 01, 1927  Subjective/Objective:                  Subacute left lumbar radiculopathy Action/Plan: dsicharge planning Expected Discharge Date:                  Expected Discharge Plan:  [place]  In-House Referral:     Discharge planning Services  CM Consult  Post Acute Care Choice:    Choice offered to:  Patient  DME Arranged:  N/A DME Agency:  NA  HH Arranged:  PT [place] Agency:  [facility] ([facility]  Status of Service:  Completed, signed off  If discussed at [employer], dates discussed:    Additional Comments: CM met iwht pt in room to offer choice of home health agency. Pt chooses [facility].  Referral called to [facility] rep, Stanton Kidney.  Pt has all DME needed at home.  NO other CM needs were communicated. Dellie Catholic, RN 10/19/2016, 12:17 PM

## 2016-10-20 DIAGNOSIS — J449 Chronic obstructive pulmonary disease, unspecified: Secondary | ICD-10-CM | POA: Diagnosis not present

## 2016-10-20 DIAGNOSIS — E1151 Type 2 diabetes mellitus with diabetic peripheral angiopathy without gangrene: Secondary | ICD-10-CM | POA: Diagnosis not present

## 2016-10-20 DIAGNOSIS — E1122 Type 2 diabetes mellitus with diabetic chronic kidney disease: Secondary | ICD-10-CM | POA: Diagnosis not present

## 2016-10-20 DIAGNOSIS — Z8631 Personal history of diabetic foot ulcer: Secondary | ICD-10-CM | POA: Diagnosis not present

## 2016-10-20 DIAGNOSIS — I5032 Chronic diastolic (congestive) heart failure: Secondary | ICD-10-CM | POA: Diagnosis not present

## 2016-10-20 DIAGNOSIS — Z794 Long term (current) use of insulin: Secondary | ICD-10-CM | POA: Diagnosis not present

## 2016-10-20 DIAGNOSIS — Z7902 Long term (current) use of antithrombotics/antiplatelets: Secondary | ICD-10-CM | POA: Diagnosis not present

## 2016-10-20 DIAGNOSIS — Z8551 Personal history of malignant neoplasm of bladder: Secondary | ICD-10-CM | POA: Diagnosis not present

## 2016-10-20 DIAGNOSIS — I2510 Atherosclerotic heart disease of native coronary artery without angina pectoris: Secondary | ICD-10-CM | POA: Diagnosis not present

## 2016-10-20 DIAGNOSIS — N183 Chronic kidney disease, stage 3 (moderate): Secondary | ICD-10-CM | POA: Diagnosis not present

## 2016-10-20 DIAGNOSIS — Z4789 Encounter for other orthopedic aftercare: Secondary | ICD-10-CM | POA: Diagnosis not present

## 2016-10-20 DIAGNOSIS — I130 Hypertensive heart and chronic kidney disease with heart failure and stage 1 through stage 4 chronic kidney disease, or unspecified chronic kidney disease: Secondary | ICD-10-CM | POA: Diagnosis not present

## 2016-10-21 ENCOUNTER — Telehealth (INDEPENDENT_AMBULATORY_CARE_PROVIDER_SITE_OTHER): Payer: Self-pay | Admitting: Specialist

## 2016-10-21 DIAGNOSIS — Z8551 Personal history of malignant neoplasm of bladder: Secondary | ICD-10-CM | POA: Diagnosis not present

## 2016-10-21 DIAGNOSIS — I130 Hypertensive heart and chronic kidney disease with heart failure and stage 1 through stage 4 chronic kidney disease, or unspecified chronic kidney disease: Secondary | ICD-10-CM | POA: Diagnosis not present

## 2016-10-21 DIAGNOSIS — I5032 Chronic diastolic (congestive) heart failure: Secondary | ICD-10-CM | POA: Diagnosis not present

## 2016-10-21 DIAGNOSIS — Z7902 Long term (current) use of antithrombotics/antiplatelets: Secondary | ICD-10-CM | POA: Diagnosis not present

## 2016-10-21 DIAGNOSIS — E1151 Type 2 diabetes mellitus with diabetic peripheral angiopathy without gangrene: Secondary | ICD-10-CM | POA: Diagnosis not present

## 2016-10-21 DIAGNOSIS — J449 Chronic obstructive pulmonary disease, unspecified: Secondary | ICD-10-CM | POA: Diagnosis not present

## 2016-10-21 DIAGNOSIS — E1122 Type 2 diabetes mellitus with diabetic chronic kidney disease: Secondary | ICD-10-CM | POA: Diagnosis not present

## 2016-10-21 DIAGNOSIS — N183 Chronic kidney disease, stage 3 (moderate): Secondary | ICD-10-CM | POA: Diagnosis not present

## 2016-10-21 DIAGNOSIS — Z4789 Encounter for other orthopedic aftercare: Secondary | ICD-10-CM | POA: Diagnosis not present

## 2016-10-21 DIAGNOSIS — Z8631 Personal history of diabetic foot ulcer: Secondary | ICD-10-CM | POA: Diagnosis not present

## 2016-10-21 DIAGNOSIS — I2510 Atherosclerotic heart disease of native coronary artery without angina pectoris: Secondary | ICD-10-CM | POA: Diagnosis not present

## 2016-10-21 DIAGNOSIS — Z794 Long term (current) use of insulin: Secondary | ICD-10-CM | POA: Diagnosis not present

## 2016-10-21 NOTE — Telephone Encounter (Signed)
Physical Therapist Barbaraann Barthel w/[facility]  calling for verbal order for  Home PT   Frequency as follows:  4x a wk for 1 wk 3x a wk for 1 wk 2x a wk for 6 wks

## 2016-10-21 NOTE — Telephone Encounter (Signed)
Called and left vm giving verbal order.  Thanks.

## 2016-10-23 DIAGNOSIS — I5032 Chronic diastolic (congestive) heart failure: Secondary | ICD-10-CM | POA: Diagnosis not present

## 2016-10-23 DIAGNOSIS — E1122 Type 2 diabetes mellitus with diabetic chronic kidney disease: Secondary | ICD-10-CM | POA: Diagnosis not present

## 2016-10-23 DIAGNOSIS — J449 Chronic obstructive pulmonary disease, unspecified: Secondary | ICD-10-CM | POA: Diagnosis not present

## 2016-10-23 DIAGNOSIS — Z8631 Personal history of diabetic foot ulcer: Secondary | ICD-10-CM | POA: Diagnosis not present

## 2016-10-23 DIAGNOSIS — Z4789 Encounter for other orthopedic aftercare: Secondary | ICD-10-CM | POA: Diagnosis not present

## 2016-10-23 DIAGNOSIS — Z794 Long term (current) use of insulin: Secondary | ICD-10-CM | POA: Diagnosis not present

## 2016-10-23 DIAGNOSIS — I130 Hypertensive heart and chronic kidney disease with heart failure and stage 1 through stage 4 chronic kidney disease, or unspecified chronic kidney disease: Secondary | ICD-10-CM | POA: Diagnosis not present

## 2016-10-23 DIAGNOSIS — I2510 Atherosclerotic heart disease of native coronary artery without angina pectoris: Secondary | ICD-10-CM | POA: Diagnosis not present

## 2016-10-23 DIAGNOSIS — E1151 Type 2 diabetes mellitus with diabetic peripheral angiopathy without gangrene: Secondary | ICD-10-CM | POA: Diagnosis not present

## 2016-10-23 DIAGNOSIS — Z8551 Personal history of malignant neoplasm of bladder: Secondary | ICD-10-CM | POA: Diagnosis not present

## 2016-10-23 DIAGNOSIS — Z7902 Long term (current) use of antithrombotics/antiplatelets: Secondary | ICD-10-CM | POA: Diagnosis not present

## 2016-10-23 DIAGNOSIS — N183 Chronic kidney disease, stage 3 (moderate): Secondary | ICD-10-CM | POA: Diagnosis not present

## 2016-10-25 DIAGNOSIS — I2510 Atherosclerotic heart disease of native coronary artery without angina pectoris: Secondary | ICD-10-CM | POA: Diagnosis not present

## 2016-10-25 DIAGNOSIS — J449 Chronic obstructive pulmonary disease, unspecified: Secondary | ICD-10-CM | POA: Diagnosis not present

## 2016-10-25 DIAGNOSIS — N183 Chronic kidney disease, stage 3 (moderate): Secondary | ICD-10-CM | POA: Diagnosis not present

## 2016-10-25 DIAGNOSIS — E1122 Type 2 diabetes mellitus with diabetic chronic kidney disease: Secondary | ICD-10-CM | POA: Diagnosis not present

## 2016-10-25 DIAGNOSIS — I130 Hypertensive heart and chronic kidney disease with heart failure and stage 1 through stage 4 chronic kidney disease, or unspecified chronic kidney disease: Secondary | ICD-10-CM | POA: Diagnosis not present

## 2016-10-25 DIAGNOSIS — Z8631 Personal history of diabetic foot ulcer: Secondary | ICD-10-CM | POA: Diagnosis not present

## 2016-10-25 DIAGNOSIS — Z794 Long term (current) use of insulin: Secondary | ICD-10-CM | POA: Diagnosis not present

## 2016-10-25 DIAGNOSIS — Z7902 Long term (current) use of antithrombotics/antiplatelets: Secondary | ICD-10-CM | POA: Diagnosis not present

## 2016-10-25 DIAGNOSIS — E1151 Type 2 diabetes mellitus with diabetic peripheral angiopathy without gangrene: Secondary | ICD-10-CM | POA: Diagnosis not present

## 2016-10-25 DIAGNOSIS — I5032 Chronic diastolic (congestive) heart failure: Secondary | ICD-10-CM | POA: Diagnosis not present

## 2016-10-25 DIAGNOSIS — Z4789 Encounter for other orthopedic aftercare: Secondary | ICD-10-CM | POA: Diagnosis not present

## 2016-10-25 DIAGNOSIS — Z8551 Personal history of malignant neoplasm of bladder: Secondary | ICD-10-CM | POA: Diagnosis not present

## 2016-10-28 DIAGNOSIS — I130 Hypertensive heart and chronic kidney disease with heart failure and stage 1 through stage 4 chronic kidney disease, or unspecified chronic kidney disease: Secondary | ICD-10-CM | POA: Diagnosis not present

## 2016-10-28 DIAGNOSIS — E1151 Type 2 diabetes mellitus with diabetic peripheral angiopathy without gangrene: Secondary | ICD-10-CM | POA: Diagnosis not present

## 2016-10-28 DIAGNOSIS — Z794 Long term (current) use of insulin: Secondary | ICD-10-CM | POA: Diagnosis not present

## 2016-10-28 DIAGNOSIS — Z4789 Encounter for other orthopedic aftercare: Secondary | ICD-10-CM | POA: Diagnosis not present

## 2016-10-28 DIAGNOSIS — Z8631 Personal history of diabetic foot ulcer: Secondary | ICD-10-CM | POA: Diagnosis not present

## 2016-10-28 DIAGNOSIS — Z8551 Personal history of malignant neoplasm of bladder: Secondary | ICD-10-CM | POA: Diagnosis not present

## 2016-10-28 DIAGNOSIS — N183 Chronic kidney disease, stage 3 (moderate): Secondary | ICD-10-CM | POA: Diagnosis not present

## 2016-10-28 DIAGNOSIS — I2510 Atherosclerotic heart disease of native coronary artery without angina pectoris: Secondary | ICD-10-CM | POA: Diagnosis not present

## 2016-10-28 DIAGNOSIS — E1122 Type 2 diabetes mellitus with diabetic chronic kidney disease: Secondary | ICD-10-CM | POA: Diagnosis not present

## 2016-10-28 DIAGNOSIS — Z7902 Long term (current) use of antithrombotics/antiplatelets: Secondary | ICD-10-CM | POA: Diagnosis not present

## 2016-10-28 DIAGNOSIS — I5032 Chronic diastolic (congestive) heart failure: Secondary | ICD-10-CM | POA: Diagnosis not present

## 2016-10-28 DIAGNOSIS — J449 Chronic obstructive pulmonary disease, unspecified: Secondary | ICD-10-CM | POA: Diagnosis not present

## 2016-10-29 ENCOUNTER — Encounter: Payer: Self-pay | Admitting: Cardiovascular Disease

## 2016-10-30 ENCOUNTER — Encounter: Payer: Self-pay | Admitting: Cardiovascular Disease

## 2016-10-30 ENCOUNTER — Encounter: Payer: Self-pay | Admitting: Family Medicine

## 2016-10-30 ENCOUNTER — Other Ambulatory Visit: Payer: Self-pay | Admitting: Family Medicine

## 2016-10-30 DIAGNOSIS — I130 Hypertensive heart and chronic kidney disease with heart failure and stage 1 through stage 4 chronic kidney disease, or unspecified chronic kidney disease: Secondary | ICD-10-CM | POA: Diagnosis not present

## 2016-10-30 DIAGNOSIS — Z8631 Personal history of diabetic foot ulcer: Secondary | ICD-10-CM | POA: Diagnosis not present

## 2016-10-30 DIAGNOSIS — Z4789 Encounter for other orthopedic aftercare: Secondary | ICD-10-CM | POA: Diagnosis not present

## 2016-10-30 DIAGNOSIS — E1151 Type 2 diabetes mellitus with diabetic peripheral angiopathy without gangrene: Secondary | ICD-10-CM | POA: Diagnosis not present

## 2016-10-30 DIAGNOSIS — Z8551 Personal history of malignant neoplasm of bladder: Secondary | ICD-10-CM | POA: Diagnosis not present

## 2016-10-30 DIAGNOSIS — I2510 Atherosclerotic heart disease of native coronary artery without angina pectoris: Secondary | ICD-10-CM | POA: Diagnosis not present

## 2016-10-30 DIAGNOSIS — Z794 Long term (current) use of insulin: Secondary | ICD-10-CM | POA: Diagnosis not present

## 2016-10-30 DIAGNOSIS — E1122 Type 2 diabetes mellitus with diabetic chronic kidney disease: Secondary | ICD-10-CM | POA: Diagnosis not present

## 2016-10-30 DIAGNOSIS — J449 Chronic obstructive pulmonary disease, unspecified: Secondary | ICD-10-CM | POA: Diagnosis not present

## 2016-10-30 DIAGNOSIS — Z7902 Long term (current) use of antithrombotics/antiplatelets: Secondary | ICD-10-CM | POA: Diagnosis not present

## 2016-10-30 DIAGNOSIS — I5032 Chronic diastolic (congestive) heart failure: Secondary | ICD-10-CM | POA: Diagnosis not present

## 2016-10-30 DIAGNOSIS — N183 Chronic kidney disease, stage 3 (moderate): Secondary | ICD-10-CM | POA: Diagnosis not present

## 2016-10-30 MED ORDER — OXYCODONE HCL 7.5 MG PO TABS: 7.5000 mg | ORAL_TABLET | Freq: Four times a day (QID) | ORAL | 0 refills | Status: DC | PRN

## 2016-10-30 NOTE — Telephone Encounter (Signed)
Spoke to patient's wife [name]) and was advised that patient has an appointment scheduled with Dr. Otho [facility] on 10/18/2017. Mrs. Round notified that script is up front ready for pickup.

## 2016-10-30 NOTE — Telephone Encounter (Signed)
Printed.  Let me know about him having f/u with Dr. Otho [facility] re: his pain.  Thanks.

## 2016-10-30 NOTE — Telephone Encounter (Signed)
Received refill electronically Last refill 10/17/16 #40 Last office visit 09/24/16

## 2016-11-02 ENCOUNTER — Emergency Department (HOSPITAL_COMMUNITY): Admit: 2016-11-02 | Payer: Medicare Other

## 2016-11-04 ENCOUNTER — Emergency Department (HOSPITAL_BASED_OUTPATIENT_CLINIC_OR_DEPARTMENT_OTHER): Payer: Medicare Other

## 2016-11-04 ENCOUNTER — Encounter (HOSPITAL_COMMUNITY): Payer: Self-pay

## 2016-11-04 ENCOUNTER — Emergency Department (HOSPITAL_COMMUNITY)
Admission: EM | Admit: 2016-11-04 | Discharge: 2016-11-04 | Disposition: A | Discharge status: Home / Self Care | Payer: Medicare Other | Attending: Emergency Medicine | Admitting: Emergency Medicine

## 2016-11-04 DIAGNOSIS — M79609 Pain in unspecified limb: Secondary | ICD-10-CM | POA: Diagnosis not present

## 2016-11-04 DIAGNOSIS — Z8551 Personal history of malignant neoplasm of bladder: Secondary | ICD-10-CM | POA: Diagnosis not present

## 2016-11-04 DIAGNOSIS — E1149 Type 2 diabetes mellitus with other diabetic neurological complication: Secondary | ICD-10-CM | POA: Diagnosis not present

## 2016-11-04 DIAGNOSIS — Z951 Presence of aortocoronary bypass graft: Secondary | ICD-10-CM | POA: Diagnosis not present

## 2016-11-04 DIAGNOSIS — E1122 Type 2 diabetes mellitus with diabetic chronic kidney disease: Secondary | ICD-10-CM | POA: Insufficient documentation

## 2016-11-04 DIAGNOSIS — Z794 Long term (current) use of insulin: Secondary | ICD-10-CM | POA: Diagnosis not present

## 2016-11-04 DIAGNOSIS — Z79899 Other long term (current) drug therapy: Secondary | ICD-10-CM | POA: Insufficient documentation

## 2016-11-04 DIAGNOSIS — N183 Chronic kidney disease, stage 3 (moderate): Secondary | ICD-10-CM | POA: Insufficient documentation

## 2016-11-04 DIAGNOSIS — I130 Hypertensive heart and chronic kidney disease with heart failure and stage 1 through stage 4 chronic kidney disease, or unspecified chronic kidney disease: Secondary | ICD-10-CM | POA: Insufficient documentation

## 2016-11-04 DIAGNOSIS — M79652 Pain in left thigh: Secondary | ICD-10-CM | POA: Diagnosis not present

## 2016-11-04 DIAGNOSIS — Z87891 Personal history of nicotine dependence: Secondary | ICD-10-CM | POA: Insufficient documentation

## 2016-11-04 DIAGNOSIS — M79605 Pain in left leg: Secondary | ICD-10-CM

## 2016-11-04 DIAGNOSIS — J449 Chronic obstructive pulmonary disease, unspecified: Secondary | ICD-10-CM | POA: Insufficient documentation

## 2016-11-04 DIAGNOSIS — I2510 Atherosclerotic heart disease of native coronary artery without angina pectoris: Secondary | ICD-10-CM | POA: Insufficient documentation

## 2016-11-04 DIAGNOSIS — L97529 Non-pressure chronic ulcer of other part of left foot with unspecified severity: Secondary | ICD-10-CM | POA: Insufficient documentation

## 2016-11-04 DIAGNOSIS — I5032 Chronic diastolic (congestive) heart failure: Secondary | ICD-10-CM | POA: Diagnosis not present

## 2016-11-04 DIAGNOSIS — E11621 Type 2 diabetes mellitus with foot ulcer: Secondary | ICD-10-CM | POA: Insufficient documentation

## 2016-11-04 LAB — I-STAT CHEM 8, ED
BUN: 35 mg/dL — ABNORMAL HIGH (ref 6–20)
Calcium, Ion: 1.07 mmol/L — ABNORMAL LOW (ref 1.15–1.40)
Chloride: 99 mmol/L — ABNORMAL LOW (ref 101–111)
Creatinine, Ser: 1.9 mg/dL — ABNORMAL HIGH (ref 0.61–1.24)
Glucose, Bld: 170 mg/dL — ABNORMAL HIGH (ref 65–99)
HCT: 39 % (ref 39.0–52.0)
Hemoglobin: 13.3 g/dL (ref 13.0–17.0)
Potassium: 4.3 mmol/L (ref 3.5–5.1)
Sodium: 139 mmol/L (ref 135–145)
TCO2: 29 mmol/L (ref 0–100)

## 2016-11-04 MED ORDER — HYDROMORPHONE HCL 2 MG/ML IJ SOLN
0.5000 mg | Freq: Once | INTRAMUSCULAR | Status: AC
  Administered 2016-11-04: 0.5 mg via INTRAVENOUS
  Filled 2016-11-04: qty 1

## 2016-11-04 MED ORDER — OXYCODONE-ACETAMINOPHEN 5-325 MG PO TABS
1.0000 | ORAL_TABLET | Freq: Once | ORAL | Status: AC
  Administered 2016-11-04: 2 via ORAL
  Filled 2016-11-04: qty 2

## 2016-11-04 MED ORDER — OXYCODONE-ACETAMINOPHEN 5-325 MG PO TABS: 1.0000 | ORAL_TABLET | ORAL | 0 refills | Status: DC | PRN

## 2016-11-04 MED ORDER — FENTANYL CITRATE (PF) 100 MCG/2ML IJ SOLN
50.0000 ug | INTRAMUSCULAR | Status: DC | PRN
  Administered 2016-11-04: 50 ug via INTRAVENOUS
  Filled 2016-11-04: qty 2

## 2016-11-04 NOTE — ED Notes (Signed)
Pt departed in NAD.  

## 2016-11-04 NOTE — Discharge Instructions (Signed)
If you were given medicines take as directed.  If you are on coumadin or contraceptives realize their levels and effectiveness is altered by many different medicines.  If you have any reaction (rash, tongues swelling, other) to the medicines stop taking and see a physician.    If your blood pressure was elevated in the ER make sure you follow up for management with a primary doctor or return for chest pain, shortness of breath or stroke symptoms.  Please follow up as directed and return to the ER or see a physician for new or worsening symptoms.  Thank you. Vitals:   11/04/16 1616 11/04/16 1645  BP: 116/58 134/68  Pulse: 76 77  Resp: 20   Temp: 97.5 F (36.4 C)   SpO2: 93% 91%

## 2016-11-04 NOTE — ED Triage Notes (Signed)
Pt with recent back surgery. Pt with new onset L thigh pain. Pt concerned about possible clot. Skin warm and dry distal. Unable to palpate pulses, hx of amputation. Pt with firmness to L thigh.

## 2016-11-04 NOTE — ED Provider Notes (Addendum)
[facility] DEPT Provider Note   CSN: [id number] Arrival date & time: 11/04/16  1545     History   Chief Complaint Chief Complaint  Patient presents with  . Leg Pain    HPI [name] Small is a 82 y.o. male.  Patient with arthritis, bladder cancer, CHF, COPD, recent back surgery presents with left thigh pain/ swelling and written achy for the past week. No shortness Brother chest pain. No fevers or chills. No focal radiation. No anticoagulant use currently and no blood clot history. Patient has history of left midfoot amputation secondary to peripheral vascular disease.      Past Medical History:  Diagnosis Date  . Arthritis   . Back pain    s/p lumbar injection 2014  . BENIGN PROSTATIC HYPERTROPHY 05/21/2007  . Bladder cancer ([place]) 10/2011  . CAD (coronary artery disease) CARDIOLOGIST- DR WALL - VISIT IN JUNE 2012 FOR CATH   nonobstructive by cath 6/12:  mid LAD 30%, proximal obtuse marginal-2 30%, proximal RCA 20%, mid RCA 30-40%.  He had normal cardiac output and mildly elevated filling pressures but no significant pulmonary hypertension;   Echocardiogram in May 2012 demonstrated EF 50-55% and left atrial enlargement   . Cellulitis of left foot    Hospitalized in 2006  . CHF (congestive heart failure) ([place])    per family, issues in 2012  . Chronic kidney disease (CKD), stage III (moderate) 12/04/2007   FOLLOWED BY DR PATEL  . Chronic pain of lower extremity   . Complication of anesthesia    1 time bladder cancer surgery- 2012 , oxygen satruratioon dropped had to stay overnight, no problems since then.  Marland Kitchen COPD (chronic obstructive pulmonary disease) ([place])   . DIABETES MELLITUS, TYPE II 05/21/2007  . DIVERTICULOSIS, COLON 05/21/2007   pt denies  . Dyspnea    with exertion, patient mointors saturation  . Fatigue AGE-RELATED  . Gross hematuria    pt denies  . HYPERTENSION 05/21/2007  . Impaired hearing BILATERAL HEARING AIDS  . On home oxygen therapy    "2L at  nighttime" (02/28/2016)  . PAD (peripheral artery disease) ([place])   . Pneumonia    Hx  of   . Psoriasis ELBOWS  . Psoriasis   . PSORIASIS, SCALP 10/25/2008    Patient Active Problem List   Diagnosis Date Noted  . Subacute left lumbar radiculopathy 10/18/2016    Class: Chronic  . Spinal stenosis of lumbar region with neurogenic claudication 10/18/2016  . Skin ulcer ([place]) 09/25/2016  . Spinal stenosis of lumbar region 09/25/2016  . Fall 07/04/2016  . Osteomyelitis of left foot ([place])   . Acute renal failure superimposed on stage 3 chronic kidney disease ([place])   . Osteomyelitis ([place]) 06/10/2016  . COPD (chronic obstructive pulmonary disease) ([place]) 06/10/2016  . Right second toe ulcer ([place]) 05/20/2016  . Diabetic ulcer of left foot ([place]) 03/24/2016  . Diabetic ulcer of left foot associated with diabetes mellitus due to underlying condition ([place])   . Weakness 03/18/2016  . Mood disorder ([place]) 03/18/2016  . Groin hematoma   . Chronic renal failure   . Critical lower limb ischemia 02/28/2016  . Peripheral arterial disease ([place]) 10/17/2015  . Edema 07/19/2015  . Claudication ([place]) 01/31/2015  . DOE (dyspnea on exertion) 01/31/2015  . Hyperlipidemia 01/31/2015  . HTN (hypertension) 01/31/2015  . Chronic diastolic CHF (congestive heart failure) ([place]) 01/21/2014  . Seborrheic keratoses 04/30/2013  . CHF (congestive heart failure) ([place]) 12/16/2012  . Atrial fibrillation ([place])  12/16/2012  . Hyperkalemia 11/27/2011  . Bladder cancer ([place]) 09/22/2011  . Leg cramps 08/28/2011  . Cough 05/26/2011  . CAD (coronary artery disease) of artery bypass graft 05/09/2011  . Dyspnea on exertion 04/19/2011  . PSORIASIS, SCALP 10/25/2008  . Chronic kidney disease, stage III (moderate) 12/04/2007  . Diabetes with neurologic complications ([place]) 41/28/7867  . DIVERTICULOSIS, COLON 05/21/2007  . BPH (benign prostatic hyperplasia) 05/21/2007    Past Surgical History:  Procedure Laterality Date  .  AMPUTATION Left 03/27/2016   Procedure: partial first AMPUTATION RAY; LEFT;  Surgeon: Trula Slade, DPM;  Location: [facility];  Service: Podiatry;  Laterality: Left;  . AMPUTATION Left 06/12/2016   Procedure: TRANSMETATARSAL AMPUTATION LEFT FOOT;  Surgeon: Newt Minion, MD;  Location: [facility];  Service: Orthopedics;  Laterality: Left;  . APPENDECTOMY  1941  . CARDIAC CATHETERIZATION  04-24-11/  DR Rogue Jury ARIDA   MILD NONOBSTRUCTIVE CAD, NORMA CARDIAC OUTPUT  . CARDIOVASCULAR STRESS TEST  2007  . CATARACT EXTRACTION W/ INTRAOCULAR LENS  IMPLANT, BILATERAL Bilateral ~ 2010  . CYSTOSCOPY  12/25/2011   Procedure: CYSTOSCOPY;  Surgeon: Molli Hazard, MD;  Location[facility];  Service: Urology;  Laterality: N/A;  needs intubation and to be paralyzed   . CYSTOSCOPY W/ RETROGRADES  10/30/2011   Procedure: CYSTOSCOPY WITH RETROGRADE PYELOGRAM;  Surgeon: Molli Hazard, MD;  Location: [facility];  Service: Urology;  Laterality: Bilateral;  CYSTOSCOPY POSS TURBT BILATERAL RETROGRADE PYLEOGRAM   . CYSTOSCOPY W/ RETROGRADES  11/30/2012   Procedure: CYSTOSCOPY WITH RETROGRADE PYELOGRAM;  Surgeon: Molli Hazard, MD;  Location: [facility];  Service: Urology;  Laterality: Bilateral;  Flexible cystoscopy.  . CYSTOSCOPY WITH BIOPSY  11/30/2012   Procedure: CYSTOSCOPY WITH BIOPSY;  Surgeon: Molli Hazard, MD;  Location: [facility];  Service: Urology;  Laterality: N/A;  . INCISION AND DRAINAGE FOOT Left ~ 2005   LEFT FOOT DUE TO INFECTION FROM  NAIL PUNCTURE INJURY  . LUMBAR LAMINECTOMY/DECOMPRESSION MICRODISCECTOMY N/A 10/18/2016   Procedure: LEFT AND CENTRAL L4-5 LUMBAR LAMINECTOMY WITH RESECTION OF SYNOVIAL CYST;  Surgeon: Jessy Oto, MD;  Location: [facility];  Service: Orthopedics;  Laterality: N/A;  . PENILE PROSTHESIS IMPLANT  1990s  . PERIPHERAL VASCULAR CATHETERIZATION N/A 02/14/2016   Procedure: Abdominal Aortogram  w/Lower Extremity;  Surgeon: Wellington Hampshire, MD;  Location: [facility];  Service: Cardiovascular;  Laterality: N/A;  . PERIPHERAL VASCULAR CATHETERIZATION Left 02/28/2016   Procedure: Peripheral Vascular Balloon Angioplasty;  Surgeon: Wellington Hampshire, MD;  Location: [facility];  Service: Cardiovascular;  Laterality: Left;  left peroneal and popliteal artery  . [place]  . TRANSURETHRAL RESECTION OF BLADDER TUMOR  10/30/2011   Procedure: TRANSURETHRAL RESECTION OF BLADDER TUMOR (TURBT);  Surgeon: Molli Hazard, MD;  Location: [facility];  Service: Urology;  Laterality: N/A;  . TRANSURETHRAL RESECTION OF BLADDER TUMOR  12/25/2011   Procedure: TRANSURETHRAL RESECTION OF BLADDER TUMOR (TURBT);  Surgeon: Molli Hazard, MD;  Location[facility];  Service: Urology;  Laterality: N/A;  need long gyrus instruments   . VASECTOMY  1990s       Home Medications    Prior to Admission medications   Medication Sig Start Date End Date Taking? Authorizing Provider  acetaminophen (TYLENOL) 500 MG tablet Take 1,000 mg by mouth every 8 (eight) hours as needed for mild pain, moderate pain or headache.  Historical Provider, MD  albuterol (PROVENTIL) (2.5 MG/3ML) 0.083% nebulizer solution Take 3 mLs (2.5 mg total) by nebulization every 6 (six) hours as needed for wheezing or shortness of breath. 06/21/16   Tonia Ghent, MD  amLODipine (NORVASC) 5 MG tablet Take 1 tablet (5 mg total) by mouth daily. 11/10/15   Tonia Ghent, MD  atorvastatin (LIPITOR) 10 MG tablet Take 1 tablet (10 mg total) by mouth daily. 02/23/16   Wellington Hampshire, MD  budesonide-formoterol (SYMBICORT) 160-4.5 MCG/ACT inhaler Inhale 2 puffs into the lungs 2 (two) times daily as needed (shortness of breath).  01/21/14   Tonia Ghent, MD  clobetasol (TEMOVATE) 0.05 % external solution Apply 1 application topically daily.    Historical Provider, MD   clopidogrel (PLAVIX) 75 MG tablet Take 1 tablet by mouth  daily 07/28/16   Tonia Ghent, MD  furosemide (LASIX) 40 MG tablet TAKE 1 TABLET BY MOUTH  EVERY MONDAY, WEDNESDAY,  AND FRIDAY 06/27/16   Tonia Ghent, MD  gabapentin (NEURONTIN) 100 MG capsule Take 100 mg by mouth 3 (three) times daily.    Historical Provider, MD  glipiZIDE (GLUCOTROL XL) 5 MG 24 hr tablet TAKE 1 TABLET BY MOUTH  DAILY WITH BREAKFAST 10/01/16   Tonia Ghent, MD  insulin glargine (LANTUS) 100 UNIT/ML injection Inject 0.15 mLs (15 Units total) into the skin daily. 10/17/16   Tonia Ghent, MD  insulin lispro (HUMALOG) 100 UNIT/ML injection Summary: Inject 5 Units into the skin 3 (three) times daily before meals. Use if sugar >150 before meal. If sugar <150 before meal, then skip a dose, 10/10/16   Tonia Ghent, MD  OxyCODONE HCl 7.5 MG TABA Take 7.5-15 mg by mouth every 6 (six) hours as needed (pain. sedation caution). 10/30/16   Tonia Ghent, MD  oxyCODONE-acetaminophen (PERCOCET) 5-325 MG tablet Take 1-2 tablets by mouth every 4 (four) hours as needed. 11/04/16   Elnora Morrison, MD  oxyCODONE-acetaminophen (PERCOCET) 7.5-325 MG tablet Take 1 tablet by mouth every 6 (six) hours as needed for severe pain. 10/18/16   Jessy Oto, MD  polyethylene glycol [facility]) packet Take 17 g by mouth daily as needed. Patient taking differently: Take 17 g by mouth daily as needed for mild constipation.  03/29/16   Oswald Hillock, MD  tiotropium (SPIRIVA) 18 MCG inhalation capsule Place 1 capsule (18 mcg total) into inhaler and inhale daily. 06/21/16   Tonia Ghent, MD    Family History Family History  Problem Relation Age of Onset  . Diabetes Mother   . Heart disease Brother   . Heart disease Brother   . Heart disease Brother   . Heart disease Brother   . Heart disease Brother   . Heart disease Brother   . Lung cancer Brother     Social History Social History  Substance Use Topics  . Smoking status:  Former Smoker    Packs/day: 2.00    Years: 22.00    Types: Cigarettes    Quit date: 11/11/1968  . Smokeless tobacco: Never Used  . Alcohol use No     Comment: occassional- 10/17/16- none  in 4 months- "too sick"     Allergies   Actos [pioglitazone hydrochloride]; Aspirin; Ace inhibitors; Bactrim; Gabapentin; Lyrica [pregabalin]; Pravastatin; and Sulfa drugs cross reactors   Review of Systems Review of Systems  Constitutional: Negative for chills and fever.  HENT: Negative for sore throat.   Eyes: Negative for visual  disturbance.  Respiratory: Negative for cough and shortness of breath.   Cardiovascular: Positive for leg swelling. Negative for chest pain and palpitations.  Gastrointestinal: Negative for abdominal pain and vomiting.  Genitourinary: Negative for dysuria and hematuria.  Musculoskeletal: Negative for arthralgias and back pain.  Skin: Negative for color change and rash.  Neurological: Negative for seizures and syncope.  All other systems reviewed and are negative.    Physical Exam Updated Vital Signs BP 135/61   Pulse 70   Temp 97.5 F (36.4 C)   Resp 16   SpO2 95%   Physical Exam  Constitutional: He appears well-developed and well-nourished.  HENT:  Head: Normocephalic and atraumatic.  Eyes: Conjunctivae are normal.  Neck: Neck supple.  Cardiovascular: Normal rate.   Pulmonary/Chest: Effort normal.  Abdominal: Soft.  Musculoskeletal: He exhibits edema and tenderness.  Patient has warm extremity mild edema left lower extremity. Mild tenderness medial and posterior thigh. No rash. Warm foot with 1-2+ pulses distally.  Neurological: He is alert.  Skin: Skin is warm and dry.  Psychiatric: He has a normal mood and affect.  Nursing note and vitals reviewed.    ED Treatments / Results  Labs (all labs ordered are listed, but only abnormal results are displayed) Labs Reviewed  I-STAT CHEM 8, ED - Abnormal; Notable for the following:       Result Value    Chloride 99 (*)    BUN 35 (*)    Creatinine, Ser 1.90 (*)    Glucose, Bld 170 (*)    Calcium, Ion 1.07 (*)    All other components within normal limits    EKG  EKG Interpretation None       Radiology No results found.  Procedures Procedures (including critical care time)  Medications Ordered in ED Medications  fentaNYL (SUBLIMAZE) injection 50 mcg (50 mcg Intravenous Given 11/04/16 1859)  HYDROmorphone (DILAUDID) injection 0.5 mg (not administered)  oxyCODONE-acetaminophen (PERCOCET/ROXICET) 5-325 MG per tablet 1-2 tablet (2 tablets Oral Given 11/04/16 1751)     Initial Impression / Assessment and Plan / ED Course  I have reviewed the triage vital signs and the nursing notes.  Pertinent labs & imaging results that were available during my care of the patient were reviewed by me and considered in my medical decision making (see chart for details).  Clinical Course    Patient recent surgery presents with left leg pain and swelling. Ultrasound performed by vascular technician no blood clot seen, screening arteries unremarkable per technician. No signs of infection on exam.  Plan for close outpt fup.  No indication for admission at this time.  No back pain, no fevers, no new weakness.   Results and differential diagnosis were discussed with the patient/parent/guardian. Xrays were independently reviewed by myself.  Close follow up outpatient was discussed, comfortable with the plan.   Medications  fentaNYL (SUBLIMAZE) injection 50 mcg (50 mcg Intravenous Given 11/04/16 1859)  HYDROmorphone (DILAUDID) injection 0.5 mg (not administered)  oxyCODONE-acetaminophen (PERCOCET/ROXICET) 5-325 MG per tablet 1-2 tablet (2 tablets Oral Given 11/04/16 1751)    Vitals:   11/04/16 1645 11/04/16 1900 11/04/16 1915 11/04/16 1930  BP: 134/68 153/79 159/78 135/61  Pulse: 77 70 69 70  Resp:  16    Temp:      SpO2: 91% 95% 96% 95%    Final diagnoses:  Left leg pain    Final  Clinical Impressions(s) / ED Diagnoses   Final diagnoses:  Left leg pain    New Prescriptions  New Prescriptions   OXYCODONE-ACETAMINOPHEN (PERCOCET) 5-325 MG TABLET    Take 1-2 tablets by mouth every 4 (four) hours as needed.     Elnora Morrison, MD 11/04/16 1806    Elnora Morrison, MD 11/10/16 [phone]

## 2016-11-04 NOTE — Progress Notes (Signed)
VASCULAR LAB PRELIMINARY  PRELIMINARY  PRELIMINARY  PRELIMINARY  Left lower extremity venous duplex completed.    Preliminary report:  There is no DVT or SVT noted in the left lower extremity.  Incidentally, there appears to be adequate arterial flow to the left lower extremity, by duplex.  Called report to Dr. Lesia Sago, [facility], RVT 11/04/2016, 5:33 PM

## 2016-11-04 NOTE — ED Notes (Signed)
Pt taken to Vascular

## 2016-11-05 DIAGNOSIS — J449 Chronic obstructive pulmonary disease, unspecified: Secondary | ICD-10-CM | POA: Diagnosis not present

## 2016-11-05 DIAGNOSIS — N183 Chronic kidney disease, stage 3 (moderate): Secondary | ICD-10-CM | POA: Diagnosis not present

## 2016-11-05 DIAGNOSIS — E1151 Type 2 diabetes mellitus with diabetic peripheral angiopathy without gangrene: Secondary | ICD-10-CM | POA: Diagnosis not present

## 2016-11-05 DIAGNOSIS — E1122 Type 2 diabetes mellitus with diabetic chronic kidney disease: Secondary | ICD-10-CM | POA: Diagnosis not present

## 2016-11-05 DIAGNOSIS — Z8631 Personal history of diabetic foot ulcer: Secondary | ICD-10-CM | POA: Diagnosis not present

## 2016-11-05 DIAGNOSIS — I130 Hypertensive heart and chronic kidney disease with heart failure and stage 1 through stage 4 chronic kidney disease, or unspecified chronic kidney disease: Secondary | ICD-10-CM | POA: Diagnosis not present

## 2016-11-05 DIAGNOSIS — Z794 Long term (current) use of insulin: Secondary | ICD-10-CM | POA: Diagnosis not present

## 2016-11-05 DIAGNOSIS — I5032 Chronic diastolic (congestive) heart failure: Secondary | ICD-10-CM | POA: Diagnosis not present

## 2016-11-05 DIAGNOSIS — Z8551 Personal history of malignant neoplasm of bladder: Secondary | ICD-10-CM | POA: Diagnosis not present

## 2016-11-05 DIAGNOSIS — I2510 Atherosclerotic heart disease of native coronary artery without angina pectoris: Secondary | ICD-10-CM | POA: Diagnosis not present

## 2016-11-05 DIAGNOSIS — Z4789 Encounter for other orthopedic aftercare: Secondary | ICD-10-CM | POA: Diagnosis not present

## 2016-11-05 DIAGNOSIS — Z7902 Long term (current) use of antithrombotics/antiplatelets: Secondary | ICD-10-CM | POA: Diagnosis not present

## 2016-11-07 ENCOUNTER — Ambulatory Visit (INDEPENDENT_AMBULATORY_CARE_PROVIDER_SITE_OTHER): Payer: Medicare Other | Admitting: Orthopedic Surgery

## 2016-11-07 ENCOUNTER — Encounter (INDEPENDENT_AMBULATORY_CARE_PROVIDER_SITE_OTHER): Payer: Self-pay | Admitting: Orthopedic Surgery

## 2016-11-07 DIAGNOSIS — I87322 Chronic venous hypertension (idiopathic) with inflammation of left lower extremity: Secondary | ICD-10-CM | POA: Insufficient documentation

## 2016-11-07 NOTE — Progress Notes (Signed)
Office Visit Note   Patient: [name] Small           Date of Birth: 26-Jul-1928           MRN: [id number] Visit Date: 11/07/2016              Requested by: Tonia Ghent, MD [place], [place] [address] PCP: [name], MD   Assessment & Plan: Visit Diagnoses:  1. Idiopathic chronic venous hypertension of left lower extremity with inflammation     Plan: Patient currently sits and sleeps in his reclining chair. Discussed the importance of wearing the compression at all times if he is in his chair with his legs dependent. Discussed that if he could get in bed he can remove the socks at night. The donning and offing of the sock was demonstrated to the patient and the family.  Follow-Up Instructions: Return in about 4 weeks (around 12/05/2016).   Orders:  No orders of the defined types were placed in this encounter.  No orders of the defined types were placed in this encounter.     Procedures: No procedures performed   Clinical Data: No additional findings.   Subjective: Chief Complaint  Patient presents with  . Left Foot - Edema, Pain  . Left Leg - Edema, Pain    Patient presents today in office for left foot and leg pain. He was seen in ER on Christmas day due to persistent pain and significant swelling. A doppler was ordered which returned negative for DVT. Pain and swelling are both radiating. He is not able to tolerate compression due to his pain. He is on lasix 40 mg three times a week. He has sleep disturbance due to his pain. He has history of left midfoot amputation 06/12/16 and most recently left and central L4-5 lumbar laminectomy with resection of synovial cyst on 10/18/16 with Dr. Louanne Skye.    Review of Systems  Patient sleeps and sits in his reclining chair with his legs dependent at all times.  Objective: Vital Signs: There were no vitals taken for this visit.  Physical Exam examination patient is alert oriented no adenopathy  well-dressed normal affect numerous to effort he is not wearing any socks he is ambulating in a wheelchair complaining weakness in the left leg. The calf and thigh are soft nontender no clinical signs of DVT. Recent ultrasound was negative for DVT. Patient has palpable pulses. He has a stable transmetatarsal amputation there is brawny skin color changes in the left leg with pitting edema up to the tibial tubercle. There is no cellulitis.  Ortho Exam  Specialty Comments:  No specialty comments available.  Imaging: No results found.   PMFS History: Patient Active Problem List   Diagnosis Date Noted  . Idiopathic chronic venous hypertension of left lower extremity with inflammation 11/07/2016  . Subacute left lumbar radiculopathy 10/18/2016    Class: Chronic  . Spinal stenosis of lumbar region with neurogenic claudication 10/18/2016  . Skin ulcer ([place]) 09/25/2016  . Spinal stenosis of lumbar region 09/25/2016  . Fall 07/04/2016  . Osteomyelitis of left foot ([place])   . Acute renal failure superimposed on stage 3 chronic kidney disease ([place])   . Osteomyelitis ([place]) 06/10/2016  . COPD (chronic obstructive pulmonary disease) ([place]) 06/10/2016  . Right second toe ulcer ([place]) 05/20/2016  . Diabetic ulcer of left foot ([place]) 03/24/2016  . Diabetic ulcer of left foot associated with diabetes mellitus due to underlying condition ([place])   .  Weakness 03/18/2016  . Mood disorder ([place]) 03/18/2016  . Groin hematoma   . Chronic renal failure   . Critical lower limb ischemia 02/28/2016  . Peripheral arterial disease ([place]) 10/17/2015  . Edema 07/19/2015  . Claudication ([place]) 01/31/2015  . DOE (dyspnea on exertion) 01/31/2015  . Hyperlipidemia 01/31/2015  . HTN (hypertension) 01/31/2015  . Chronic diastolic CHF (congestive heart failure) ([place]) 01/21/2014  . Seborrheic keratoses 04/30/2013  . CHF (congestive heart failure) ([place]) 12/16/2012  . Atrial fibrillation ([place]) 12/16/2012  . Hyperkalemia  11/27/2011  . Bladder cancer ([place]) 09/22/2011  . Leg cramps 08/28/2011  . Cough 05/26/2011  . CAD (coronary artery disease) of artery bypass graft 05/09/2011  . Dyspnea on exertion 04/19/2011  . PSORIASIS, SCALP 10/25/2008  . Chronic kidney disease, stage III (moderate) 12/04/2007  . Diabetes with neurologic complications ([place]) 48/18/5631  . DIVERTICULOSIS, COLON 05/21/2007  . BPH (benign prostatic hyperplasia) 05/21/2007   Past Medical History:  Diagnosis Date  . Arthritis   . Back pain    s/p lumbar injection 2014  . BENIGN PROSTATIC HYPERTROPHY 05/21/2007  . Bladder cancer ([place]) 10/2011  . CAD (coronary artery disease) CARDIOLOGIST- DR WALL - VISIT IN JUNE 2012 FOR CATH   nonobstructive by cath 6/12:  mid LAD 30%, proximal obtuse marginal-2 30%, proximal RCA 20%, mid RCA 30-40%.  He had normal cardiac output and mildly elevated filling pressures but no significant pulmonary hypertension;   Echocardiogram in May 2012 demonstrated EF 50-55% and left atrial enlargement   . Cellulitis of left foot    Hospitalized in 2006  . CHF (congestive heart failure) ([place])    per family, issues in 2012  . Chronic kidney disease (CKD), stage III (moderate) 12/04/2007   FOLLOWED BY DR PATEL  . Chronic pain of lower extremity   . Complication of anesthesia    1 time bladder cancer surgery- 2012 , oxygen satruratioon dropped had to stay overnight, no problems since then.  Marland Kitchen COPD (chronic obstructive pulmonary disease) ([place])   . DIABETES MELLITUS, TYPE II 05/21/2007  . DIVERTICULOSIS, COLON 05/21/2007   pt denies  . Dyspnea    with exertion, patient mointors saturation  . Fatigue AGE-RELATED  . Gross hematuria    pt denies  . HYPERTENSION 05/21/2007  . Impaired hearing BILATERAL HEARING AIDS  . On home oxygen therapy    "2L at nighttime" (02/28/2016)  . PAD (peripheral artery disease) ([place])   . Pneumonia    Hx  of   . Psoriasis ELBOWS  . Psoriasis   . PSORIASIS, SCALP 10/25/2008    Family  History  Problem Relation Age of Onset  . Diabetes Mother   . Heart disease Brother   . Heart disease Brother   . Heart disease Brother   . Heart disease Brother   . Heart disease Brother   . Heart disease Brother   . Lung cancer Brother     Past Surgical History:  Procedure Laterality Date  . AMPUTATION Left 03/27/2016   Procedure: partial first AMPUTATION RAY; LEFT;  Surgeon: Trula Slade, DPM;  Location: [facility];  Service: Podiatry;  Laterality: Left;  . AMPUTATION Left 06/12/2016   Procedure: TRANSMETATARSAL AMPUTATION LEFT FOOT;  Surgeon: Newt Minion, MD;  Location: [facility];  Service: Orthopedics;  Laterality: Left;  . APPENDECTOMY  1941  . CARDIAC CATHETERIZATION  04-24-11/  DR Rogue Jury ARIDA   MILD NONOBSTRUCTIVE CAD, NORMA CARDIAC OUTPUT  . CARDIOVASCULAR STRESS TEST  2007  . CATARACT EXTRACTION  W/ INTRAOCULAR LENS  IMPLANT, BILATERAL Bilateral ~ 2010  . CYSTOSCOPY  12/25/2011   Procedure: CYSTOSCOPY;  Surgeon: Molli Hazard, MD;  Location: [facility];  Service: Urology;  Laterality: N/A;  needs intubation and to be paralyzed   . CYSTOSCOPY W/ RETROGRADES  10/30/2011   Procedure: CYSTOSCOPY WITH RETROGRADE PYELOGRAM;  Surgeon: Molli Hazard, MD;  Location: [facility];  Service: Urology;  Laterality: Bilateral;  CYSTOSCOPY POSS TURBT BILATERAL RETROGRADE PYLEOGRAM   . CYSTOSCOPY W/ RETROGRADES  11/30/2012   Procedure: CYSTOSCOPY WITH RETROGRADE PYELOGRAM;  Surgeon: Molli Hazard, MD;  Location: [facility];  Service: Urology;  Laterality: Bilateral;  Flexible cystoscopy.  . CYSTOSCOPY WITH BIOPSY  11/30/2012   Procedure: CYSTOSCOPY WITH BIOPSY;  Surgeon: Molli Hazard, MD;  Location: [facility];  Service: Urology;  Laterality: N/A;  . INCISION AND DRAINAGE FOOT Left ~ 2005   LEFT FOOT DUE TO INFECTION FROM  NAIL PUNCTURE INJURY  . LUMBAR LAMINECTOMY/DECOMPRESSION MICRODISCECTOMY N/A  10/18/2016   Procedure: LEFT AND CENTRAL L4-5 LUMBAR LAMINECTOMY WITH RESECTION OF SYNOVIAL CYST;  Surgeon: Jessy Oto, MD;  Location: [facility];  Service: Orthopedics;  Laterality: N/A;  . PENILE PROSTHESIS IMPLANT  1990s  . PERIPHERAL VASCULAR CATHETERIZATION N/A 02/14/2016   Procedure: Abdominal Aortogram w/Lower Extremity;  Surgeon: Wellington Hampshire, MD;  Location: [facility];  Service: Cardiovascular;  Laterality: N/A;  . PERIPHERAL VASCULAR CATHETERIZATION Left 02/28/2016   Procedure: Peripheral Vascular Balloon Angioplasty;  Surgeon: Wellington Hampshire, MD;  Location: [facility];  Service: Cardiovascular;  Laterality: Left;  left peroneal and popliteal artery  . [place]  . TRANSURETHRAL RESECTION OF BLADDER TUMOR  10/30/2011   Procedure: TRANSURETHRAL RESECTION OF BLADDER TUMOR (TURBT);  Surgeon: Molli Hazard, MD;  Location: [facility];  Service: Urology;  Laterality: N/A;  . TRANSURETHRAL RESECTION OF BLADDER TUMOR  12/25/2011   Procedure: TRANSURETHRAL RESECTION OF BLADDER TUMOR (TURBT);  Surgeon: Molli Hazard, MD;  Location: [facility];  Service: Urology;  Laterality: N/A;  need long gyrus instruments   . VASECTOMY  1990s   Social History   Occupational History  . retired      [employer]	[place].   Social History Main Topics  . Smoking status: Former Smoker    Packs/day: 2.00    Years: 22.00    Types: Cigarettes    Quit date: 11/11/1968  . Smokeless tobacco: Never Used  . Alcohol use No     Comment: occassional- 10/17/16- none  in 4 months- "too sick"  . Drug use: No  . Sexual activity: Not Currently

## 2016-11-09 DIAGNOSIS — I509 Heart failure, unspecified: Secondary | ICD-10-CM | POA: Diagnosis not present

## 2016-11-12 ENCOUNTER — Other Ambulatory Visit: Payer: Self-pay | Admitting: Family Medicine

## 2016-11-12 DIAGNOSIS — J449 Chronic obstructive pulmonary disease, unspecified: Secondary | ICD-10-CM | POA: Diagnosis not present

## 2016-11-12 NOTE — Telephone Encounter (Addendum)
MyChart RF request.  Last Filled:    40 tablet 0 10/30/2016  Please advise.

## 2016-11-13 MED ORDER — OXYCODONE HCL 7.5 MG PO TABS: 7.5000 mg | ORAL_TABLET | Freq: Four times a day (QID) | ORAL | 0 refills | Status: DC | PRN

## 2016-11-13 MED ORDER — INSULIN LISPRO 100 UNIT/ML ~~LOC~~ SOLN: SUBCUTANEOUS | 2 refills | Status: DC

## 2016-11-13 NOTE — Telephone Encounter (Signed)
I would try to adjust his mealtime insulin, the lispro, based on his mealtime sugars.  Inject 10 Units into the skin before meals if sugar is >180 prior to the meal.  Inject 5 Units into the skin before meals if sugar is 140-179 prior to the meal.  If sugar <139 or lower before meal, then skip a dose.   See how that does.  It may help some and please update me in a few days.   I wouldn't change his lantus at this point.  Thanks.

## 2016-11-13 NOTE — Telephone Encounter (Signed)
Patient's wife notified as instructed by telephone and verbalized understanding. Patient's wife stated that his fasting blood sugars have been running around 150 and higher, today it was 157. Mrs. Hashemi stated that his pain control has been better, but Saturday and Tuesday the pain was bad in his leg. Patient's wife advised that script is up front ready for pickup.

## 2016-11-13 NOTE — Telephone Encounter (Signed)
Printed.  Thanks.  I saw his set of sugars from prior to [place].   Had been <200 usually, down to 81 one AM.   How has his sugar been in the meantime? How is his pain control? Thanks.

## 2016-11-14 NOTE — Telephone Encounter (Signed)
[name] returned your call best number [phone]

## 2016-11-14 NOTE — Telephone Encounter (Signed)
Left message at home number for patient's wife to call back.

## 2016-11-14 NOTE — Telephone Encounter (Signed)
Patient's wife notified as instructed and verbalized understanding. Mrs. Bolz wrote instructions down as we spoke. Patient's wife stated that his fasting blood sugar this morning at 3:30 am was 61.  Mrs. Vivona stated that she will check his blood sugars and call back with update as instructed.

## 2016-11-15 NOTE — Telephone Encounter (Signed)
I'll await the update.  I would still continue with the variable dosing based on pre meal sugars listed below.  Thanks.

## 2016-11-18 ENCOUNTER — Ambulatory Visit (INDEPENDENT_AMBULATORY_CARE_PROVIDER_SITE_OTHER): Payer: Medicare Other | Admitting: Specialist

## 2016-11-21 ENCOUNTER — Other Ambulatory Visit: Payer: Self-pay | Admitting: Family Medicine

## 2016-11-21 NOTE — Telephone Encounter (Signed)
Electronic refill request. Last Filled:    40 tablet 0 11/13/2016  ? Too soon  Please advise.

## 2016-11-22 MED ORDER — OXYCODONE HCL 7.5 MG PO TABS: 7.5000 mg | ORAL_TABLET | Freq: Four times a day (QID) | ORAL | 0 refills | Status: DC | PRN

## 2016-11-22 NOTE — Telephone Encounter (Signed)
Patient advised.  Rx left at front desk for pick up. 

## 2016-11-22 NOTE — Telephone Encounter (Signed)
Pt's wife called to check the status of oxycodone refill. She request cb at [phone].

## 2016-11-22 NOTE — Telephone Encounter (Signed)
Printed.  Thanks.  

## 2016-11-25 ENCOUNTER — Telehealth: Payer: Self-pay | Admitting: Family Medicine

## 2016-11-25 DIAGNOSIS — E1140 Type 2 diabetes mellitus with diabetic neuropathy, unspecified: Secondary | ICD-10-CM

## 2016-11-25 DIAGNOSIS — E1149 Type 2 diabetes mellitus with other diabetic neurological complication: Secondary | ICD-10-CM

## 2016-11-25 NOTE — Telephone Encounter (Signed)
Call pt.  Sugar log noted.  Sugars look reasonable for now.  Would continue as is, assuming he didn't have sig low or really high sugars in the meantime.  Would like to check A1c at or before OV at some point after 01/16/17 (last A1c done 10/18/16).  Thanks.

## 2016-11-26 ENCOUNTER — Telehealth (INDEPENDENT_AMBULATORY_CARE_PROVIDER_SITE_OTHER): Payer: Self-pay | Admitting: Specialist

## 2016-11-26 DIAGNOSIS — Z7902 Long term (current) use of antithrombotics/antiplatelets: Secondary | ICD-10-CM | POA: Diagnosis not present

## 2016-11-26 DIAGNOSIS — Z8551 Personal history of malignant neoplasm of bladder: Secondary | ICD-10-CM | POA: Diagnosis not present

## 2016-11-26 DIAGNOSIS — N183 Chronic kidney disease, stage 3 (moderate): Secondary | ICD-10-CM | POA: Diagnosis not present

## 2016-11-26 DIAGNOSIS — E1122 Type 2 diabetes mellitus with diabetic chronic kidney disease: Secondary | ICD-10-CM | POA: Diagnosis not present

## 2016-11-26 DIAGNOSIS — E1151 Type 2 diabetes mellitus with diabetic peripheral angiopathy without gangrene: Secondary | ICD-10-CM | POA: Diagnosis not present

## 2016-11-26 DIAGNOSIS — Z4789 Encounter for other orthopedic aftercare: Secondary | ICD-10-CM | POA: Diagnosis not present

## 2016-11-26 DIAGNOSIS — Z794 Long term (current) use of insulin: Secondary | ICD-10-CM | POA: Diagnosis not present

## 2016-11-26 DIAGNOSIS — Z8631 Personal history of diabetic foot ulcer: Secondary | ICD-10-CM | POA: Diagnosis not present

## 2016-11-26 DIAGNOSIS — I130 Hypertensive heart and chronic kidney disease with heart failure and stage 1 through stage 4 chronic kidney disease, or unspecified chronic kidney disease: Secondary | ICD-10-CM | POA: Diagnosis not present

## 2016-11-26 DIAGNOSIS — J449 Chronic obstructive pulmonary disease, unspecified: Secondary | ICD-10-CM | POA: Diagnosis not present

## 2016-11-26 DIAGNOSIS — I2510 Atherosclerotic heart disease of native coronary artery without angina pectoris: Secondary | ICD-10-CM | POA: Diagnosis not present

## 2016-11-26 DIAGNOSIS — I5032 Chronic diastolic (congestive) heart failure: Secondary | ICD-10-CM | POA: Diagnosis not present

## 2016-11-26 NOTE — Telephone Encounter (Signed)
I called and gave [name] Small Verbal ok for this.

## 2016-11-26 NOTE — Telephone Encounter (Signed)
Wife advised.  Appt scheduled.

## 2016-11-26 NOTE — Telephone Encounter (Signed)
Patient was on hold for Physical Therapy for 2 weeks.  Patient is now ready to resume sessions.    Need verbal ok on the following:    2x per wk for 3wks

## 2016-11-29 ENCOUNTER — Telehealth: Payer: Self-pay | Admitting: Family Medicine
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
## 2016-11-29 NOTE — Telephone Encounter (Addendum)
I called Dawn back.   I don't think we can set up round the clock home health.  I let her know I'll check on this to see what we can set up.  I need consent from the patient in the meantime to try to get them extra help, since he is a consenting adult.  Dawn will check patient and family this weekend and update me early next week.   I didn't call pt back directly.  I didn't give out any confidential info.

## 2016-12-01 ENCOUNTER — Other Ambulatory Visit: Payer: Self-pay | Admitting: Family Medicine

## 2016-12-01 NOTE — Telephone Encounter (Signed)
I routed this to both [name] Small and Ryan.   Patient would likely qualify for home health.  Would it be better/more effective/quicker to order home health or ask for SW input?  I presumed home health would be quicker.  This is assuming the patient consents.    Please let me know what you think.   I'll await input from patient and family.  Thanks.

## 2016-12-02 ENCOUNTER — Encounter: Payer: Self-pay | Admitting: Family Medicine

## 2016-12-02 ENCOUNTER — Telehealth: Payer: Self-pay | Admitting: Family Medicine

## 2016-12-02 ENCOUNTER — Encounter (INDEPENDENT_AMBULATORY_CARE_PROVIDER_SITE_OTHER): Payer: Self-pay | Admitting: Orthopedic Surgery

## 2016-12-02 DIAGNOSIS — M48061 Spinal stenosis, lumbar region without neurogenic claudication: Secondary | ICD-10-CM

## 2016-12-02 MED ORDER — OXYCODONE HCL 7.5 MG PO TABS: 7.5000 mg | ORAL_TABLET | Freq: Four times a day (QID) | ORAL | 0 refills | Status: DC | PRN

## 2016-12-02 NOTE — Telephone Encounter (Signed)
Ordered. Thanks

## 2016-12-02 NOTE — Telephone Encounter (Signed)
Printed.  Thanks.  

## 2016-12-02 NOTE — Telephone Encounter (Signed)
See phone note re: palliative care eval and tx.

## 2016-12-02 NOTE — Telephone Encounter (Signed)
Called the patient and spoke to patients wife because [name] Small was laying down. Both the patient and the patients wife agree to the Palliative Care referral for Social Work and Skilled nursing to provide additional help at home with offering more resources such as home health. Please put Palliative Care Referral into Epic so I can call in the consult and they can have a Triage nurse call them today.. We also need to fill out Palliative Care referral form and fax over the office notes ASAP. Patients wife will be contacting the Orthopedic surgeons office about ordering an MRI, they have an appointment with Ortho on 12/12/16.

## 2016-12-02 NOTE — Telephone Encounter (Signed)
Wife [name]) notified that rx was ready and will be at front desk.

## 2016-12-03 DIAGNOSIS — Z8631 Personal history of diabetic foot ulcer: Secondary | ICD-10-CM | POA: Diagnosis not present

## 2016-12-03 DIAGNOSIS — E1151 Type 2 diabetes mellitus with diabetic peripheral angiopathy without gangrene: Secondary | ICD-10-CM | POA: Diagnosis not present

## 2016-12-03 DIAGNOSIS — Z7902 Long term (current) use of antithrombotics/antiplatelets: Secondary | ICD-10-CM | POA: Diagnosis not present

## 2016-12-03 DIAGNOSIS — I2510 Atherosclerotic heart disease of native coronary artery without angina pectoris: Secondary | ICD-10-CM | POA: Diagnosis not present

## 2016-12-03 DIAGNOSIS — N183 Chronic kidney disease, stage 3 (moderate): Secondary | ICD-10-CM | POA: Diagnosis not present

## 2016-12-03 DIAGNOSIS — Z8551 Personal history of malignant neoplasm of bladder: Secondary | ICD-10-CM | POA: Diagnosis not present

## 2016-12-03 DIAGNOSIS — J449 Chronic obstructive pulmonary disease, unspecified: Secondary | ICD-10-CM | POA: Diagnosis not present

## 2016-12-03 DIAGNOSIS — I130 Hypertensive heart and chronic kidney disease with heart failure and stage 1 through stage 4 chronic kidney disease, or unspecified chronic kidney disease: Secondary | ICD-10-CM | POA: Diagnosis not present

## 2016-12-03 DIAGNOSIS — Z794 Long term (current) use of insulin: Secondary | ICD-10-CM | POA: Diagnosis not present

## 2016-12-03 DIAGNOSIS — E1122 Type 2 diabetes mellitus with diabetic chronic kidney disease: Secondary | ICD-10-CM | POA: Diagnosis not present

## 2016-12-03 DIAGNOSIS — I5032 Chronic diastolic (congestive) heart failure: Secondary | ICD-10-CM | POA: Diagnosis not present

## 2016-12-03 DIAGNOSIS — Z4789 Encounter for other orthopedic aftercare: Secondary | ICD-10-CM | POA: Diagnosis not present

## 2016-12-04 DIAGNOSIS — R531 Weakness: Secondary | ICD-10-CM | POA: Diagnosis not present

## 2016-12-05 ENCOUNTER — Telehealth: Payer: Self-pay

## 2016-12-05 DIAGNOSIS — M549 Dorsalgia, unspecified: Secondary | ICD-10-CM

## 2016-12-05 MED ORDER — TRAZODONE HCL 50 MG PO TABS: 25.0000 mg | ORAL_TABLET | Freq: Every day | ORAL | Status: DC

## 2016-12-05 NOTE — Telephone Encounter (Signed)
Anselm Jungling, RN back and gave the verbal order for the medications and advise her of Dr. Josefine Class comments regarding not giving pt ibuprofen.   Inez Catalina RN advise me that since they don't have a Education officer, museum that we would have to put in an order for a [place] Social worker Eval. Spoke to [place] and she advise me that Dr. Damita Dunnings needs to put in a [place] order and in the notes put pt needs nursing, PT, Social Worker and anything else you want to have in the [place] and she will be on the look out for the referral

## 2016-12-05 NOTE — Telephone Encounter (Signed)
Inez Catalina nurse with [facility] left v/m; NP saw pt 12/04/16 and wants to know if Dr Damita Dunnings will agree with NP recommendations. Pt was complaining of insomnia and depression and the NP recommendation was trazodone 25 mg at hs and for pain the NP recommended acetaminophen 500 mg with ibuprofen 200 mg q6h prn and monitor periodic BMet.  Also Palliative Care is in between social workers and request verbal order for home health social worker until gets another palliative care social worker.Please advise.

## 2016-12-05 NOTE — Telephone Encounter (Signed)
I agree with all of that EXCEPT the ibuprofen.  I wouldn't do that with his kidney function.   Please give verbal order for home health social worker, trazodone 25 mg PO qhs and acetaminophen 500 mg q6h prn.  Thanks.

## 2016-12-06 DIAGNOSIS — I130 Hypertensive heart and chronic kidney disease with heart failure and stage 1 through stage 4 chronic kidney disease, or unspecified chronic kidney disease: Secondary | ICD-10-CM | POA: Diagnosis not present

## 2016-12-06 DIAGNOSIS — J449 Chronic obstructive pulmonary disease, unspecified: Secondary | ICD-10-CM | POA: Diagnosis not present

## 2016-12-06 DIAGNOSIS — N183 Chronic kidney disease, stage 3 (moderate): Secondary | ICD-10-CM | POA: Diagnosis not present

## 2016-12-06 DIAGNOSIS — I2510 Atherosclerotic heart disease of native coronary artery without angina pectoris: Secondary | ICD-10-CM | POA: Diagnosis not present

## 2016-12-06 DIAGNOSIS — Z8631 Personal history of diabetic foot ulcer: Secondary | ICD-10-CM | POA: Diagnosis not present

## 2016-12-06 DIAGNOSIS — Z7902 Long term (current) use of antithrombotics/antiplatelets: Secondary | ICD-10-CM | POA: Diagnosis not present

## 2016-12-06 DIAGNOSIS — Z8551 Personal history of malignant neoplasm of bladder: Secondary | ICD-10-CM | POA: Diagnosis not present

## 2016-12-06 DIAGNOSIS — E1122 Type 2 diabetes mellitus with diabetic chronic kidney disease: Secondary | ICD-10-CM | POA: Diagnosis not present

## 2016-12-06 DIAGNOSIS — E1151 Type 2 diabetes mellitus with diabetic peripheral angiopathy without gangrene: Secondary | ICD-10-CM | POA: Diagnosis not present

## 2016-12-06 DIAGNOSIS — I5032 Chronic diastolic (congestive) heart failure: Secondary | ICD-10-CM | POA: Diagnosis not present

## 2016-12-06 DIAGNOSIS — Z4789 Encounter for other orthopedic aftercare: Secondary | ICD-10-CM | POA: Diagnosis not present

## 2016-12-06 DIAGNOSIS — Z794 Long term (current) use of insulin: Secondary | ICD-10-CM | POA: Diagnosis not present

## 2016-12-06 NOTE — Telephone Encounter (Signed)
Ordered. Thanks

## 2016-12-09 DIAGNOSIS — Z794 Long term (current) use of insulin: Secondary | ICD-10-CM | POA: Diagnosis not present

## 2016-12-09 DIAGNOSIS — E119 Type 2 diabetes mellitus without complications: Secondary | ICD-10-CM | POA: Diagnosis not present

## 2016-12-10 DIAGNOSIS — E1151 Type 2 diabetes mellitus with diabetic peripheral angiopathy without gangrene: Secondary | ICD-10-CM | POA: Diagnosis not present

## 2016-12-10 DIAGNOSIS — N183 Chronic kidney disease, stage 3 (moderate): Secondary | ICD-10-CM | POA: Diagnosis not present

## 2016-12-10 DIAGNOSIS — I2510 Atherosclerotic heart disease of native coronary artery without angina pectoris: Secondary | ICD-10-CM | POA: Diagnosis not present

## 2016-12-10 DIAGNOSIS — J449 Chronic obstructive pulmonary disease, unspecified: Secondary | ICD-10-CM | POA: Diagnosis not present

## 2016-12-10 DIAGNOSIS — E1122 Type 2 diabetes mellitus with diabetic chronic kidney disease: Secondary | ICD-10-CM | POA: Diagnosis not present

## 2016-12-10 DIAGNOSIS — I130 Hypertensive heart and chronic kidney disease with heart failure and stage 1 through stage 4 chronic kidney disease, or unspecified chronic kidney disease: Secondary | ICD-10-CM | POA: Diagnosis not present

## 2016-12-10 DIAGNOSIS — Z794 Long term (current) use of insulin: Secondary | ICD-10-CM | POA: Diagnosis not present

## 2016-12-10 DIAGNOSIS — I5032 Chronic diastolic (congestive) heart failure: Secondary | ICD-10-CM | POA: Diagnosis not present

## 2016-12-10 DIAGNOSIS — Z7902 Long term (current) use of antithrombotics/antiplatelets: Secondary | ICD-10-CM | POA: Diagnosis not present

## 2016-12-10 DIAGNOSIS — Z4789 Encounter for other orthopedic aftercare: Secondary | ICD-10-CM | POA: Diagnosis not present

## 2016-12-10 DIAGNOSIS — Z8551 Personal history of malignant neoplasm of bladder: Secondary | ICD-10-CM | POA: Diagnosis not present

## 2016-12-10 DIAGNOSIS — I509 Heart failure, unspecified: Secondary | ICD-10-CM | POA: Diagnosis not present

## 2016-12-10 DIAGNOSIS — Z8631 Personal history of diabetic foot ulcer: Secondary | ICD-10-CM | POA: Diagnosis not present

## 2016-12-12 ENCOUNTER — Ambulatory Visit (INDEPENDENT_AMBULATORY_CARE_PROVIDER_SITE_OTHER): Payer: Medicare Other | Admitting: Orthopedic Surgery

## 2016-12-13 DIAGNOSIS — J449 Chronic obstructive pulmonary disease, unspecified: Secondary | ICD-10-CM | POA: Diagnosis not present

## 2016-12-14 DIAGNOSIS — E1122 Type 2 diabetes mellitus with diabetic chronic kidney disease: Secondary | ICD-10-CM | POA: Diagnosis not present

## 2016-12-14 DIAGNOSIS — I5032 Chronic diastolic (congestive) heart failure: Secondary | ICD-10-CM | POA: Diagnosis not present

## 2016-12-14 DIAGNOSIS — I2510 Atherosclerotic heart disease of native coronary artery without angina pectoris: Secondary | ICD-10-CM | POA: Diagnosis not present

## 2016-12-14 DIAGNOSIS — N183 Chronic kidney disease, stage 3 (moderate): Secondary | ICD-10-CM | POA: Diagnosis not present

## 2016-12-14 DIAGNOSIS — M7989 Other specified soft tissue disorders: Secondary | ICD-10-CM | POA: Diagnosis not present

## 2016-12-14 DIAGNOSIS — S72002D Fracture of unspecified part of neck of left femur, subsequent encounter for closed fracture with routine healing: Secondary | ICD-10-CM | POA: Diagnosis not present

## 2016-12-14 DIAGNOSIS — Z466 Encounter for fitting and adjustment of urinary device: Secondary | ICD-10-CM | POA: Diagnosis not present

## 2016-12-14 DIAGNOSIS — L89620 Pressure ulcer of left heel, unstageable: Secondary | ICD-10-CM | POA: Diagnosis not present

## 2016-12-14 DIAGNOSIS — R1909 Other intra-abdominal and pelvic swelling, mass and lump: Secondary | ICD-10-CM | POA: Diagnosis not present

## 2016-12-14 DIAGNOSIS — J449 Chronic obstructive pulmonary disease, unspecified: Secondary | ICD-10-CM | POA: Diagnosis not present

## 2016-12-14 DIAGNOSIS — Z96642 Presence of left artificial hip joint: Secondary | ICD-10-CM | POA: Diagnosis not present

## 2016-12-14 DIAGNOSIS — E1151 Type 2 diabetes mellitus with diabetic peripheral angiopathy without gangrene: Secondary | ICD-10-CM | POA: Diagnosis not present

## 2016-12-14 DIAGNOSIS — I130 Hypertensive heart and chronic kidney disease with heart failure and stage 1 through stage 4 chronic kidney disease, or unspecified chronic kidney disease: Secondary | ICD-10-CM | POA: Diagnosis not present

## 2016-12-16 ENCOUNTER — Other Ambulatory Visit: Payer: Self-pay | Admitting: Family Medicine

## 2016-12-16 MED ORDER — OXYCODONE HCL 7.5 MG PO TABS: 7.5000 mg | ORAL_TABLET | Freq: Four times a day (QID) | ORAL | 0 refills | Status: DC | PRN

## 2016-12-16 NOTE — Telephone Encounter (Signed)
Printed.   Please get general update on patient.  Thanks.

## 2016-12-16 NOTE — Telephone Encounter (Signed)
[name] Small notified his prescription is ready to be picked up at the front desk.  He states he is doing alright.

## 2016-12-16 NOTE — Telephone Encounter (Signed)
Last office visit 09/24/2016.  Last refilled 12/02/2016 for #40 with no refills.  Refill?

## 2016-12-17 ENCOUNTER — Telehealth (INDEPENDENT_AMBULATORY_CARE_PROVIDER_SITE_OTHER): Payer: Self-pay | Admitting: *Deleted

## 2016-12-17 NOTE — Telephone Encounter (Signed)
Wes from [facility] called this afternoon needing verbal orders for physical therapy 1 visit this week and then twice a week for 8 weeks. Thank you His CB # [phone]) [phone].

## 2016-12-19 DIAGNOSIS — I5032 Chronic diastolic (congestive) heart failure: Secondary | ICD-10-CM | POA: Diagnosis not present

## 2016-12-19 DIAGNOSIS — E1151 Type 2 diabetes mellitus with diabetic peripheral angiopathy without gangrene: Secondary | ICD-10-CM | POA: Diagnosis not present

## 2016-12-19 DIAGNOSIS — J449 Chronic obstructive pulmonary disease, unspecified: Secondary | ICD-10-CM | POA: Diagnosis not present

## 2016-12-19 DIAGNOSIS — R1909 Other intra-abdominal and pelvic swelling, mass and lump: Secondary | ICD-10-CM | POA: Diagnosis not present

## 2016-12-19 DIAGNOSIS — S72002D Fracture of unspecified part of neck of left femur, subsequent encounter for closed fracture with routine healing: Secondary | ICD-10-CM | POA: Diagnosis not present

## 2016-12-19 DIAGNOSIS — L89620 Pressure ulcer of left heel, unstageable: Secondary | ICD-10-CM | POA: Diagnosis not present

## 2016-12-19 DIAGNOSIS — I130 Hypertensive heart and chronic kidney disease with heart failure and stage 1 through stage 4 chronic kidney disease, or unspecified chronic kidney disease: Secondary | ICD-10-CM | POA: Diagnosis not present

## 2016-12-19 DIAGNOSIS — Z466 Encounter for fitting and adjustment of urinary device: Secondary | ICD-10-CM | POA: Diagnosis not present

## 2016-12-19 DIAGNOSIS — Z96642 Presence of left artificial hip joint: Secondary | ICD-10-CM | POA: Diagnosis not present

## 2016-12-19 DIAGNOSIS — I2510 Atherosclerotic heart disease of native coronary artery without angina pectoris: Secondary | ICD-10-CM | POA: Diagnosis not present

## 2016-12-19 DIAGNOSIS — E1122 Type 2 diabetes mellitus with diabetic chronic kidney disease: Secondary | ICD-10-CM | POA: Diagnosis not present

## 2016-12-19 DIAGNOSIS — N183 Chronic kidney disease, stage 3 (moderate): Secondary | ICD-10-CM | POA: Diagnosis not present

## 2016-12-19 DIAGNOSIS — M7989 Other specified soft tissue disorders: Secondary | ICD-10-CM | POA: Diagnosis not present

## 2016-12-19 NOTE — Telephone Encounter (Signed)
I called and left voicemail for Wes to advise verbal ok for physical therapy

## 2016-12-24 DIAGNOSIS — J449 Chronic obstructive pulmonary disease, unspecified: Secondary | ICD-10-CM | POA: Diagnosis not present

## 2016-12-24 DIAGNOSIS — M7989 Other specified soft tissue disorders: Secondary | ICD-10-CM | POA: Diagnosis not present

## 2016-12-24 DIAGNOSIS — N183 Chronic kidney disease, stage 3 (moderate): Secondary | ICD-10-CM | POA: Diagnosis not present

## 2016-12-24 DIAGNOSIS — Z466 Encounter for fitting and adjustment of urinary device: Secondary | ICD-10-CM | POA: Diagnosis not present

## 2016-12-24 DIAGNOSIS — L89620 Pressure ulcer of left heel, unstageable: Secondary | ICD-10-CM | POA: Diagnosis not present

## 2016-12-24 DIAGNOSIS — Z96642 Presence of left artificial hip joint: Secondary | ICD-10-CM | POA: Diagnosis not present

## 2016-12-24 DIAGNOSIS — I5032 Chronic diastolic (congestive) heart failure: Secondary | ICD-10-CM | POA: Diagnosis not present

## 2016-12-24 DIAGNOSIS — E1122 Type 2 diabetes mellitus with diabetic chronic kidney disease: Secondary | ICD-10-CM | POA: Diagnosis not present

## 2016-12-24 DIAGNOSIS — S72002D Fracture of unspecified part of neck of left femur, subsequent encounter for closed fracture with routine healing: Secondary | ICD-10-CM | POA: Diagnosis not present

## 2016-12-24 DIAGNOSIS — I130 Hypertensive heart and chronic kidney disease with heart failure and stage 1 through stage 4 chronic kidney disease, or unspecified chronic kidney disease: Secondary | ICD-10-CM | POA: Diagnosis not present

## 2016-12-24 DIAGNOSIS — I2510 Atherosclerotic heart disease of native coronary artery without angina pectoris: Secondary | ICD-10-CM | POA: Diagnosis not present

## 2016-12-24 DIAGNOSIS — E1151 Type 2 diabetes mellitus with diabetic peripheral angiopathy without gangrene: Secondary | ICD-10-CM | POA: Diagnosis not present

## 2016-12-24 DIAGNOSIS — R1909 Other intra-abdominal and pelvic swelling, mass and lump: Secondary | ICD-10-CM | POA: Diagnosis not present

## 2016-12-26 ENCOUNTER — Encounter (INDEPENDENT_AMBULATORY_CARE_PROVIDER_SITE_OTHER): Payer: Self-pay | Admitting: Specialist

## 2016-12-26 ENCOUNTER — Ambulatory Visit (INDEPENDENT_AMBULATORY_CARE_PROVIDER_SITE_OTHER): Payer: Medicare Other

## 2016-12-26 ENCOUNTER — Inpatient Hospital Stay (HOSPITAL_COMMUNITY)
Admission: AD | Admit: 2016-12-26 | Discharge: 2016-12-31 | DRG: 470 | Disposition: A | Discharge status: Skilled Nursing Facility | Payer: Medicare Other | Source: Ambulatory Visit | Attending: Orthopaedic Surgery | Admitting: Orthopaedic Surgery

## 2016-12-26 ENCOUNTER — Encounter (HOSPITAL_COMMUNITY): Payer: Self-pay | Admitting: *Deleted

## 2016-12-26 ENCOUNTER — Other Ambulatory Visit: Payer: Self-pay | Admitting: Family Medicine

## 2016-12-26 ENCOUNTER — Ambulatory Visit (INDEPENDENT_AMBULATORY_CARE_PROVIDER_SITE_OTHER): Payer: Medicare Other | Admitting: Specialist

## 2016-12-26 ENCOUNTER — Inpatient Hospital Stay (HOSPITAL_COMMUNITY): Payer: Medicare Other

## 2016-12-26 VITALS — BP 130/63 | HR 68 | Ht 74.0 in | Wt 193.0 lb

## 2016-12-26 DIAGNOSIS — M25552 Pain in left hip: Secondary | ICD-10-CM

## 2016-12-26 DIAGNOSIS — Z01818 Encounter for other preprocedural examination: Secondary | ICD-10-CM

## 2016-12-26 DIAGNOSIS — R7989 Other specified abnormal findings of blood chemistry: Secondary | ICD-10-CM | POA: Diagnosis not present

## 2016-12-26 DIAGNOSIS — I10 Essential (primary) hypertension: Secondary | ICD-10-CM | POA: Diagnosis present

## 2016-12-26 DIAGNOSIS — M6281 Muscle weakness (generalized): Secondary | ICD-10-CM | POA: Diagnosis not present

## 2016-12-26 DIAGNOSIS — R29898 Other symptoms and signs involving the musculoskeletal system: Secondary | ICD-10-CM

## 2016-12-26 DIAGNOSIS — I130 Hypertensive heart and chronic kidney disease with heart failure and stage 1 through stage 4 chronic kidney disease, or unspecified chronic kidney disease: Secondary | ICD-10-CM | POA: Diagnosis not present

## 2016-12-26 DIAGNOSIS — N183 Chronic kidney disease, stage 3 unspecified: Secondary | ICD-10-CM | POA: Diagnosis present

## 2016-12-26 DIAGNOSIS — Z888 Allergy status to other drugs, medicaments and biological substances status: Secondary | ICD-10-CM

## 2016-12-26 DIAGNOSIS — Z96649 Presence of unspecified artificial hip joint: Secondary | ICD-10-CM | POA: Diagnosis present

## 2016-12-26 DIAGNOSIS — R0902 Hypoxemia: Secondary | ICD-10-CM

## 2016-12-26 DIAGNOSIS — S72002A Fracture of unspecified part of neck of left femur, initial encounter for closed fracture: Secondary | ICD-10-CM

## 2016-12-26 DIAGNOSIS — Z9841 Cataract extraction status, right eye: Secondary | ICD-10-CM

## 2016-12-26 DIAGNOSIS — Z9889 Other specified postprocedural states: Secondary | ICD-10-CM | POA: Diagnosis not present

## 2016-12-26 DIAGNOSIS — S72009A Fracture of unspecified part of neck of unspecified femur, initial encounter for closed fracture: Secondary | ICD-10-CM | POA: Diagnosis not present

## 2016-12-26 DIAGNOSIS — Z801 Family history of malignant neoplasm of trachea, bronchus and lung: Secondary | ICD-10-CM

## 2016-12-26 DIAGNOSIS — E1151 Type 2 diabetes mellitus with diabetic peripheral angiopathy without gangrene: Secondary | ICD-10-CM | POA: Diagnosis not present

## 2016-12-26 DIAGNOSIS — E1142 Type 2 diabetes mellitus with diabetic polyneuropathy: Secondary | ICD-10-CM | POA: Diagnosis not present

## 2016-12-26 DIAGNOSIS — N400 Enlarged prostate without lower urinary tract symptoms: Secondary | ICD-10-CM | POA: Diagnosis present

## 2016-12-26 DIAGNOSIS — M1990 Unspecified osteoarthritis, unspecified site: Secondary | ICD-10-CM | POA: Diagnosis present

## 2016-12-26 DIAGNOSIS — N179 Acute kidney failure, unspecified: Secondary | ICD-10-CM | POA: Diagnosis not present

## 2016-12-26 DIAGNOSIS — I5032 Chronic diastolic (congestive) heart failure: Secondary | ICD-10-CM | POA: Diagnosis not present

## 2016-12-26 DIAGNOSIS — E1122 Type 2 diabetes mellitus with diabetic chronic kidney disease: Secondary | ICD-10-CM | POA: Diagnosis not present

## 2016-12-26 DIAGNOSIS — N2889 Other specified disorders of kidney and ureter: Secondary | ICD-10-CM | POA: Diagnosis not present

## 2016-12-26 DIAGNOSIS — I5033 Acute on chronic diastolic (congestive) heart failure: Secondary | ICD-10-CM | POA: Diagnosis present

## 2016-12-26 DIAGNOSIS — Z961 Presence of intraocular lens: Secondary | ICD-10-CM | POA: Diagnosis present

## 2016-12-26 DIAGNOSIS — I509 Heart failure, unspecified: Secondary | ICD-10-CM | POA: Diagnosis not present

## 2016-12-26 DIAGNOSIS — Z8249 Family history of ischemic heart disease and other diseases of the circulatory system: Secondary | ICD-10-CM

## 2016-12-26 DIAGNOSIS — Z8781 Personal history of (healed) traumatic fracture: Secondary | ICD-10-CM | POA: Diagnosis not present

## 2016-12-26 DIAGNOSIS — Z886 Allergy status to analgesic agent status: Secondary | ICD-10-CM

## 2016-12-26 DIAGNOSIS — R259 Unspecified abnormal involuntary movements: Secondary | ICD-10-CM | POA: Diagnosis not present

## 2016-12-26 DIAGNOSIS — W1830XA Fall on same level, unspecified, initial encounter: Secondary | ICD-10-CM | POA: Diagnosis present

## 2016-12-26 DIAGNOSIS — J449 Chronic obstructive pulmonary disease, unspecified: Secondary | ICD-10-CM | POA: Diagnosis not present

## 2016-12-26 DIAGNOSIS — M1612 Unilateral primary osteoarthritis, left hip: Secondary | ICD-10-CM

## 2016-12-26 DIAGNOSIS — M12552 Traumatic arthropathy, left hip: Secondary | ICD-10-CM | POA: Diagnosis not present

## 2016-12-26 DIAGNOSIS — Z8551 Personal history of malignant neoplasm of bladder: Secondary | ICD-10-CM | POA: Diagnosis not present

## 2016-12-26 DIAGNOSIS — Z794 Long term (current) use of insulin: Secondary | ICD-10-CM | POA: Diagnosis not present

## 2016-12-26 DIAGNOSIS — B952 Enterococcus as the cause of diseases classified elsewhere: Secondary | ICD-10-CM | POA: Diagnosis present

## 2016-12-26 DIAGNOSIS — Z87891 Personal history of nicotine dependence: Secondary | ICD-10-CM | POA: Diagnosis not present

## 2016-12-26 DIAGNOSIS — Z4789 Encounter for other orthopedic aftercare: Secondary | ICD-10-CM | POA: Diagnosis not present

## 2016-12-26 DIAGNOSIS — L409 Psoriasis, unspecified: Secondary | ICD-10-CM | POA: Diagnosis present

## 2016-12-26 DIAGNOSIS — R2689 Other abnormalities of gait and mobility: Secondary | ICD-10-CM | POA: Diagnosis not present

## 2016-12-26 DIAGNOSIS — M79605 Pain in left leg: Secondary | ICD-10-CM | POA: Diagnosis not present

## 2016-12-26 DIAGNOSIS — Z881 Allergy status to other antibiotic agents status: Secondary | ICD-10-CM

## 2016-12-26 DIAGNOSIS — Z96642 Presence of left artificial hip joint: Secondary | ICD-10-CM

## 2016-12-26 DIAGNOSIS — I959 Hypotension, unspecified: Secondary | ICD-10-CM | POA: Diagnosis present

## 2016-12-26 DIAGNOSIS — R278 Other lack of coordination: Secondary | ICD-10-CM | POA: Diagnosis not present

## 2016-12-26 DIAGNOSIS — M25562 Pain in left knee: Secondary | ICD-10-CM

## 2016-12-26 DIAGNOSIS — N390 Urinary tract infection, site not specified: Secondary | ICD-10-CM | POA: Diagnosis present

## 2016-12-26 DIAGNOSIS — H9190 Unspecified hearing loss, unspecified ear: Secondary | ICD-10-CM | POA: Diagnosis present

## 2016-12-26 DIAGNOSIS — G8929 Other chronic pain: Secondary | ICD-10-CM

## 2016-12-26 DIAGNOSIS — J9601 Acute respiratory failure with hypoxia: Secondary | ICD-10-CM

## 2016-12-26 DIAGNOSIS — I129 Hypertensive chronic kidney disease with stage 1 through stage 4 chronic kidney disease, or unspecified chronic kidney disease: Secondary | ICD-10-CM | POA: Diagnosis not present

## 2016-12-26 DIAGNOSIS — E440 Moderate protein-calorie malnutrition: Secondary | ICD-10-CM | POA: Diagnosis not present

## 2016-12-26 DIAGNOSIS — E119 Type 2 diabetes mellitus without complications: Secondary | ICD-10-CM | POA: Diagnosis present

## 2016-12-26 DIAGNOSIS — Z7902 Long term (current) use of antithrombotics/antiplatelets: Secondary | ICD-10-CM

## 2016-12-26 DIAGNOSIS — Z833 Family history of diabetes mellitus: Secondary | ICD-10-CM

## 2016-12-26 DIAGNOSIS — N329 Bladder disorder, unspecified: Secondary | ICD-10-CM | POA: Diagnosis present

## 2016-12-26 DIAGNOSIS — Z882 Allergy status to sulfonamides status: Secondary | ICD-10-CM

## 2016-12-26 DIAGNOSIS — E785 Hyperlipidemia, unspecified: Secondary | ICD-10-CM | POA: Diagnosis not present

## 2016-12-26 DIAGNOSIS — Z9981 Dependence on supplemental oxygen: Secondary | ICD-10-CM

## 2016-12-26 DIAGNOSIS — E875 Hyperkalemia: Secondary | ICD-10-CM | POA: Diagnosis present

## 2016-12-26 DIAGNOSIS — D62 Acute posthemorrhagic anemia: Secondary | ICD-10-CM | POA: Diagnosis not present

## 2016-12-26 DIAGNOSIS — Z471 Aftercare following joint replacement surgery: Secondary | ICD-10-CM | POA: Diagnosis not present

## 2016-12-26 DIAGNOSIS — J438 Other emphysema: Secondary | ICD-10-CM | POA: Diagnosis not present

## 2016-12-26 DIAGNOSIS — Z9842 Cataract extraction status, left eye: Secondary | ICD-10-CM

## 2016-12-26 DIAGNOSIS — I2510 Atherosclerotic heart disease of native coronary artery without angina pectoris: Secondary | ICD-10-CM | POA: Diagnosis not present

## 2016-12-26 DIAGNOSIS — Z9181 History of falling: Secondary | ICD-10-CM | POA: Diagnosis not present

## 2016-12-26 LAB — CBC
HCT: 39.8 % (ref 39.0–52.0)
Hemoglobin: 13.1 g/dL (ref 13.0–17.0)
MCH: 31.7 pg (ref 26.0–34.0)
MCHC: 32.9 g/dL (ref 30.0–36.0)
MCV: 96.4 fL (ref 78.0–100.0)
Platelets: 237 10*3/uL (ref 150–400)
RBC: 4.13 MIL/uL — ABNORMAL LOW (ref 4.22–5.81)
RDW: 14.1 % (ref 11.5–15.5)
WBC: 10.1 10*3/uL (ref 4.0–10.5)

## 2016-12-26 LAB — COMPREHENSIVE METABOLIC PANEL
ALT: 11 U/L — ABNORMAL LOW (ref 17–63)
AST: 15 U/L (ref 15–41)
Albumin: 3.9 g/dL (ref 3.5–5.0)
Alkaline Phosphatase: 52 U/L (ref 38–126)
Anion gap: 9 (ref 5–15)
BUN: 51 mg/dL — ABNORMAL HIGH (ref 6–20)
CO2: 28 mmol/L (ref 22–32)
Calcium: 8.8 mg/dL — ABNORMAL LOW (ref 8.9–10.3)
Chloride: 103 mmol/L (ref 101–111)
Creatinine, Ser: 1.94 mg/dL — ABNORMAL HIGH (ref 0.61–1.24)
GFR calc Af Amer: 34 mL/min — ABNORMAL LOW (ref >60)
GFR calc non Af Amer: 30 mL/min — ABNORMAL LOW (ref >60)
Glucose, Bld: 138 mg/dL — ABNORMAL HIGH (ref 65–99)
Potassium: 4.2 mmol/L (ref 3.5–5.1)
Sodium: 140 mmol/L (ref 135–145)
Total Bilirubin: 0.4 mg/dL (ref 0.3–1.2)
Total Protein: 6.8 g/dL (ref 6.5–8.1)

## 2016-12-26 LAB — SURGICAL PCR SCREEN
MRSA, PCR: NEGATIVE
Staphylococcus aureus: NEGATIVE

## 2016-12-26 LAB — URINALYSIS, ROUTINE W REFLEX MICROSCOPIC
Bilirubin Urine: NEGATIVE
Glucose, UA: NEGATIVE mg/dL
Ketones, ur: NEGATIVE mg/dL
Nitrite: NEGATIVE
Protein, ur: 30 mg/dL — AB
Specific Gravity, Urine: 1.015 (ref 1.005–1.030)
Squamous Epithelial / LPF: NONE SEEN
pH: 5 (ref 5.0–8.0)

## 2016-12-26 LAB — APTT: aPTT: 33 seconds (ref 24–36)

## 2016-12-26 LAB — GLUCOSE, CAPILLARY
Glucose-Capillary: 124 mg/dL — ABNORMAL HIGH (ref 65–99)
Glucose-Capillary: 173 mg/dL — ABNORMAL HIGH (ref 65–99)

## 2016-12-26 LAB — PROTIME-INR
INR: 1
Prothrombin Time: 13.2 seconds (ref 11.4–15.2)

## 2016-12-26 LAB — ABO/RH: ABO/RH(D): A POS

## 2016-12-26 MED ORDER — INSULIN ASPART 100 UNIT/ML ~~LOC~~ SOLN
0.0000 [IU] | Freq: Three times a day (TID) | SUBCUTANEOUS | Status: DC
Start: 2016-12-26 — End: 2016-12-31
  Administered 2016-12-26 – 2016-12-28 (×2): 3 [IU] via SUBCUTANEOUS
  Administered 2016-12-28 (×2): 8 [IU] via SUBCUTANEOUS
  Administered 2016-12-29: 5 [IU] via SUBCUTANEOUS
  Administered 2016-12-29 (×2): 3 [IU] via SUBCUTANEOUS
  Administered 2016-12-30: 8 [IU] via SUBCUTANEOUS
  Administered 2016-12-30: 5 [IU] via SUBCUTANEOUS
  Administered 2016-12-30 – 2016-12-31 (×2): 3 [IU] via SUBCUTANEOUS
  Administered 2016-12-31: 15 [IU] via SUBCUTANEOUS

## 2016-12-26 MED ORDER — MORPHINE SULFATE (PF) 4 MG/ML IV SOLN
0.5000 mg | INTRAVENOUS | Status: DC | PRN
  Administered 2016-12-27: 0.52 mg via INTRAVENOUS
  Filled 2016-12-26 (×2): qty 1

## 2016-12-26 MED ORDER — OXYCODONE-ACETAMINOPHEN 5-325 MG PO TABS
1.0000 | ORAL_TABLET | ORAL | Status: DC | PRN
  Administered 2016-12-26: 1 via ORAL
  Administered 2016-12-28 – 2016-12-29 (×3): 2 via ORAL
  Filled 2016-12-26: qty 1
  Filled 2016-12-26 (×3): qty 2

## 2016-12-26 MED ORDER — AMLODIPINE BESYLATE 5 MG PO TABS: 5.0000 mg | ORAL_TABLET | Freq: Every day | ORAL | Status: DC

## 2016-12-26 MED ORDER — POLYETHYLENE GLYCOL 3350 17 G PO PACK
17.0000 g | PACK | Freq: Every day | ORAL | Status: DC
  Administered 2016-12-28 – 2016-12-31 (×4): 17 g via ORAL
  Filled 2016-12-26 (×4): qty 1

## 2016-12-26 MED ORDER — TRAZODONE HCL 50 MG PO TABS
25.0000 mg | ORAL_TABLET | Freq: Every day | ORAL | Status: DC
  Administered 2016-12-26 – 2016-12-30 (×5): 25 mg via ORAL
  Filled 2016-12-26 (×5): qty 1

## 2016-12-26 MED ORDER — CLOBETASOL PROPIONATE 0.05 % EX SOLN
1.0000 "application " | Freq: Every day | CUTANEOUS | Status: DC
  Administered 2016-12-30: 1 via TOPICAL
  Filled 2016-12-26: qty 50

## 2016-12-26 MED ORDER — ATORVASTATIN CALCIUM 10 MG PO TABS
10.0000 mg | ORAL_TABLET | Freq: Every day | ORAL | Status: DC
  Administered 2016-12-28 – 2016-12-30 (×3): 10 mg via ORAL
  Filled 2016-12-26 (×3): qty 1

## 2016-12-26 MED ORDER — TIOTROPIUM BROMIDE MONOHYDRATE 18 MCG IN CAPS
18.0000 ug | ORAL_CAPSULE | Freq: Every day | RESPIRATORY_TRACT | Status: DC
  Administered 2016-12-27 – 2016-12-31 (×5): 18 ug via RESPIRATORY_TRACT
  Filled 2016-12-26: qty 5

## 2016-12-26 MED ORDER — SODIUM CHLORIDE 0.9 % IV SOLN
INTRAVENOUS | Status: DC
  Administered 2016-12-27: 11:00:00 via INTRAVENOUS

## 2016-12-26 MED ORDER — FUROSEMIDE 40 MG PO TABS
80.0000 mg | ORAL_TABLET | ORAL | Status: DC
  Filled 2016-12-26: qty 2

## 2016-12-26 MED ORDER — ATORVASTATIN CALCIUM 10 MG PO TABS: 10.0000 mg | ORAL_TABLET | Freq: Every day | ORAL | Status: DC

## 2016-12-26 MED ORDER — INSULIN ASPART 100 UNIT/ML ~~LOC~~ SOLN
4.0000 [IU] | Freq: Three times a day (TID) | SUBCUTANEOUS | Status: DC
  Administered 2016-12-26 – 2016-12-31 (×12): 4 [IU] via SUBCUTANEOUS

## 2016-12-26 MED ORDER — AMLODIPINE BESYLATE 5 MG PO TABS
2.5000 mg | ORAL_TABLET | Freq: Every day | ORAL | Status: DC
  Filled 2016-12-26: qty 1

## 2016-12-26 MED ORDER — FLUTICASONE FUROATE-VILANTEROL 200-25 MCG/INH IN AEPB
1.0000 | INHALATION_SPRAY | Freq: Every day | RESPIRATORY_TRACT | Status: DC
  Administered 2016-12-27 – 2016-12-31 (×5): 1 via RESPIRATORY_TRACT
  Filled 2016-12-26: qty 28

## 2016-12-26 MED ORDER — ALBUTEROL SULFATE (2.5 MG/3ML) 0.083% IN NEBU: 2.5000 mg | INHALATION_SOLUTION | Freq: Four times a day (QID) | RESPIRATORY_TRACT | Status: DC | PRN

## 2016-12-26 MED ORDER — ACETAMINOPHEN 500 MG PO TABS: 1000.0000 mg | ORAL_TABLET | Freq: Three times a day (TID) | ORAL | Status: DC | PRN

## 2016-12-26 MED ORDER — OXYCODONE HCL 5 MG PO TABS
5.0000 mg | ORAL_TABLET | ORAL | Status: DC | PRN
  Administered 2016-12-26: 10 mg via ORAL
  Filled 2016-12-26 (×2): qty 1
  Filled 2016-12-26: qty 2
  Filled 2016-12-26: qty 1
  Filled 2016-12-26: qty 2

## 2016-12-26 MED ORDER — GABAPENTIN 100 MG PO CAPS: 100.0000 mg | ORAL_CAPSULE | Freq: Three times a day (TID) | ORAL | Status: DC

## 2016-12-26 NOTE — Patient Instructions (Addendum)
Proceed to [facility] to be admitted to Dr. Trevor Mace service for left total hip replacement acutely for a left hip fracture.

## 2016-12-26 NOTE — Progress Notes (Signed)
Post-Op Visit Note   Patient: [name] Small           Date of Birth: 27-Jun-1928           MRN: [id number] Visit Date: 12/26/2016 PCP: [name], MD   Assessment & Plan:10 weeks post left L4-5 laminectomy for spinal stenosis with synovial cyst. Plain radiographs of the left hip show a subacute or chronic left transcervical hip fracture. Diabetic peripheral neuropathy with abnormal sensation in the left leg and poor recognition of hip pain. Osteoarthritis of the left hip, moderate. Diabetes mellitus, insulin dependent, Type 2.  Chief Complaint:  [name] Small is 10 weeks post Left and central L4-5 lumbar laminectomy with resection of synovial cyst.  He says that his back is doing great.  But he is having left knee pain since August.  Visit Diagnoses:  1. Weakness of left leg   2. Weakness of left hip   3. Chronic pain of left knee   4. Pain of left hip joint   5. Closed left hip fracture, initial encounter ([place])   6. Unilateral primary osteoarthritis, left hip   EXAM: He is in a wheelchair and has not been seen by myself since he was in the hospital 10/18/2016 for his lumbar laminectomy procedure. He complains of left knee being continuously painful and he has Severe weakness in the left leg and thigh. He has been able to stand and to walk. Family notes that he is walking even up stairs with out a walker. He has difficulty actively flexing the left hip. Original injury in August 2017 fall in the parking lot and when in the hospital post laminectomy he reports a fall while trying to walk up stairs with PT. Small complaints of hip pain but worsening left knee pain. He wants something done about his left knee pain today.  Left foot DF and PF is intact. Left calf without atrophy, Left thigh is 3/4 inch greater in circumference compared with the right. Sitting the left knee is minimally shortened compared with the right knee,left "positive galeazzi sign". Active left knee extension vs gravity is  present but easily overcome with a single finger pressure over the left anterior tibia. Left hip flexion is 0/4 with the patient demonstrating using his hands and arms to lift the left leg and thigh to flex his left hip. Passive IR and ER of the left hip is not painful. Incision lumbar spine midline is well healed.  Plain radiographs: left knee with minimal degenerative changes of the left medial joint line. Left hip with chronic displaced left femoral neck fracture with shortening and fracture resorption.  Plan: Proceed to [facility] to be admitted to Dr. Trevor Mace service for  left total hip replacement acutely for a left hip fracture.    Follow-Up Instructions: Return in about 4 weeks (around 01/23/2017).   Orders:  Orders Placed This Encounter  Procedures  . XR HIP UNILAT W OR W/O PELVIS 2-3 VIEWS LEFT  . XR Knee 1-2 Views Left   Small orders of the defined types were placed in this encounter.   Imaging: Xr Hip Unilat W Or W/o Pelvis 2-3 Views Left  Result Date: 12/26/2016 AP pelvis and lateral of the left hip show left hip fracture through the mid femoral neck with bone resorption and varus deformity. Shortening of the left femoral neck by 3/4 inch, loss of normal Shenton's line. Findings consistent with subacute or chronic left femoral neck fracture. Moderately severe narrowing of the inferomedial left  hip joint line. Narrowing of the superolateral joint line though the superomedial joint line is well maintained.Right hip with decreased contour of the lateral head and neck, Small lucency, mild degenerative changes of the right hip.    Xr Knee 1-2 Views Left  Result Date: 12/26/2016 Left knee AP and lateral radiographs, show the medial joint line minimally narrowed, Small significant osteophytes, lateral radiograph with patella baja Small fracture, dislocation or subluxation. Minimal medial joint line narrowing, minimal degenerative changes.   PMFS History: Patient Active Problem List   Diagnosis  Date Noted  . Subacute left lumbar radiculopathy 10/18/2016    Priority: High    Class: Chronic  . Spinal stenosis of lumbar region 09/25/2016    Priority: High  . Idiopathic chronic venous hypertension of left lower extremity with inflammation 11/07/2016  . Spinal stenosis of lumbar region with neurogenic claudication 10/18/2016  . Skin ulcer ([place]) 09/25/2016  . Fall 07/04/2016  . Osteomyelitis of left foot ([place])   . Acute renal failure superimposed on stage 3 chronic kidney disease ([place])   . Osteomyelitis ([place]) 06/10/2016  . COPD (chronic obstructive pulmonary disease) ([place]) 06/10/2016  . Right second toe ulcer ([place]) 05/20/2016  . Diabetic ulcer of left foot ([place]) 03/24/2016  . Diabetic ulcer of left foot associated with diabetes mellitus due to underlying condition ([place])   . Weakness 03/18/2016  . Mood disorder ([place]) 03/18/2016  . Groin hematoma   . Chronic renal failure   . Critical lower limb ischemia 02/28/2016  . Peripheral arterial disease ([place]) 10/17/2015  . Edema 07/19/2015  . Claudication ([place]) 01/31/2015  . DOE (dyspnea on exertion) 01/31/2015  . Hyperlipidemia 01/31/2015  . HTN (hypertension) 01/31/2015  . Chronic diastolic CHF (congestive heart failure) ([place]) 01/21/2014  . Seborrheic keratoses 04/30/2013  . CHF (congestive heart failure) ([place]) 12/16/2012  . Atrial fibrillation ([place]) 12/16/2012  . Hyperkalemia 11/27/2011  . Bladder cancer ([place]) 09/22/2011  . Leg cramps 08/28/2011  . Cough 05/26/2011  . CAD (coronary artery disease) of artery bypass graft 05/09/2011  . Dyspnea on exertion 04/19/2011  . PSORIASIS, SCALP 10/25/2008  . Chronic kidney disease, stage III (moderate) 12/04/2007  . Diabetes with neurologic complications ([place]) 29/93/7169  . DIVERTICULOSIS, COLON 05/21/2007  . BPH (benign prostatic hyperplasia) 05/21/2007   Past Medical History:  Diagnosis Date  . Arthritis   . Back pain    s/p lumbar injection 2014  . BENIGN PROSTATIC HYPERTROPHY  05/21/2007  . Bladder cancer ([place]) 10/2011  . CAD (coronary artery disease) CARDIOLOGIST- DR WALL - VISIT IN JUNE 2012 FOR CATH   nonobstructive by cath 6/12:  mid LAD 30%, proximal obtuse marginal-2 30%, proximal RCA 20%, mid RCA 30-40%.  He had normal cardiac output and mildly elevated filling pressures but Small significant pulmonary hypertension;   Echocardiogram in May 2012 demonstrated EF 50-55% and left atrial enlargement   . Cellulitis of left foot    Hospitalized in 2006  . CHF (congestive heart failure) ([place])    per family, issues in 2012  . Chronic kidney disease (CKD), stage III (moderate) 12/04/2007   FOLLOWED BY DR PATEL  . Chronic pain of lower extremity   . Complication of anesthesia    1 time bladder cancer surgery- 2012 , oxygen satruratioon dropped had to stay overnight, Small problems since then.  Marland Kitchen COPD (chronic obstructive pulmonary disease) ([place])   . DIABETES MELLITUS, TYPE II 05/21/2007  . DIVERTICULOSIS, COLON 05/21/2007   pt denies  . Dyspnea    with  exertion, patient mointors saturation  . Fatigue AGE-RELATED  . Gross hematuria    pt denies  . HYPERTENSION 05/21/2007  . Impaired hearing BILATERAL HEARING AIDS  . On home oxygen therapy    "2L at nighttime" (02/28/2016)  . PAD (peripheral artery disease) ([place])   . Pneumonia    Hx  of   . Psoriasis ELBOWS  . Psoriasis   . PSORIASIS, SCALP 10/25/2008    Family History  Problem Relation Age of Onset  . Diabetes Mother   . Heart disease Brother   . Heart disease Brother   . Heart disease Brother   . Heart disease Brother   . Heart disease Brother   . Heart disease Brother   . Lung cancer Brother     Past Surgical History:  Procedure Laterality Date  . AMPUTATION Left 03/27/2016   Procedure: partial first AMPUTATION RAY; LEFT;  Surgeon: Trula Slade, DPM;  Location: [facility];  Service: Podiatry;  Laterality: Left;  . AMPUTATION Left 06/12/2016   Procedure: TRANSMETATARSAL AMPUTATION LEFT FOOT;  Surgeon: Newt Minion, MD;  Location: [facility];  Service: Orthopedics;  Laterality: Left;  . APPENDECTOMY  1941  . CARDIAC CATHETERIZATION  04-24-11/  DR Rogue Jury ARIDA   MILD NONOBSTRUCTIVE CAD, NORMA CARDIAC OUTPUT  . CARDIOVASCULAR STRESS TEST  2007  . CATARACT EXTRACTION W/ INTRAOCULAR LENS  IMPLANT, BILATERAL Bilateral ~ 2010  . CYSTOSCOPY  12/25/2011   Procedure: CYSTOSCOPY;  Surgeon: Molli Hazard, MD;  Location[facility];  Service: Urology;  Laterality: N/A;  needs intubation and to be paralyzed   . CYSTOSCOPY W/ RETROGRADES  10/30/2011   Procedure: CYSTOSCOPY WITH RETROGRADE PYELOGRAM;  Surgeon: Molli Hazard, MD;  [facility];  Service: Urology;  Laterality: Bilateral;  CYSTOSCOPY POSS TURBT BILATERAL RETROGRADE PYLEOGRAM   . CYSTOSCOPY W/ RETROGRADES  11/30/2012   Procedure: CYSTOSCOPY WITH RETROGRADE PYELOGRAM;  Surgeon: Molli Hazard, MD;  Location: [facility];  Service: Urology;  Laterality: Bilateral;  Flexible cystoscopy.  . CYSTOSCOPY WITH BIOPSY  11/30/2012   Procedure: CYSTOSCOPY WITH BIOPSY;  Surgeon: Molli Hazard, MD;  Location: [facility];  Service: Urology;  Laterality: N/A;  . INCISION AND DRAINAGE FOOT Left ~ 2005   LEFT FOOT DUE TO INFECTION FROM  NAIL PUNCTURE INJURY  . LUMBAR LAMINECTOMY/DECOMPRESSION MICRODISCECTOMY N/A 10/18/2016   Procedure: LEFT AND CENTRAL L4-5 LUMBAR LAMINECTOMY WITH RESECTION OF SYNOVIAL CYST;  Surgeon: Jessy Oto, MD;  Location: [facility];  Service: Orthopedics;  Laterality: N/A;  . PENILE PROSTHESIS IMPLANT  1990s  . PERIPHERAL VASCULAR CATHETERIZATION N/A 02/14/2016   Procedure: Abdominal Aortogram w/Lower Extremity;  Surgeon: Wellington Hampshire, MD;  Location: [facility];  Service: Cardiovascular;  Laterality: N/A;  . PERIPHERAL VASCULAR CATHETERIZATION Left 02/28/2016   Procedure: Peripheral Vascular Balloon Angioplasty;  Surgeon: Wellington Hampshire,  MD;  Location: [facility];  Service: Cardiovascular;  Laterality: Left;  left peroneal and popliteal artery  . [place]  . TRANSURETHRAL RESECTION OF BLADDER TUMOR  10/30/2011   Procedure: TRANSURETHRAL RESECTION OF BLADDER TUMOR (TURBT);  Surgeon: Molli Hazard, MD;  Location: [facility];  Service: Urology;  Laterality: N/A;  . TRANSURETHRAL RESECTION OF BLADDER TUMOR  12/25/2011   Procedure: TRANSURETHRAL RESECTION OF BLADDER TUMOR (TURBT);  Surgeon: Molli Hazard, MD;  Location: [facility];  Service: Urology;  Laterality: N/A;  need long  gyrus instruments   . VASECTOMY  1990s   Social History   Occupational History  . retired      [employer]	[place].   Social History Main Topics  . Smoking status: Former Smoker    Packs/day: 2.00    Years: 22.00    Types: Cigarettes    Quit date: 11/11/1968  . Smokeless tobacco: Never Used  . Alcohol use Small     Comment: occassional- 10/17/16- none  in 4 months- "too sick"  . Drug use: Small  . Sexual activity: Not Currently

## 2016-12-26 NOTE — Telephone Encounter (Signed)
See mychart Rx request, last filled on 12/16/16 #40 tabs with 0 refills, please advise

## 2016-12-26 NOTE — H&P (Signed)
[name] Small is an 83 y.o. male.   Chief Complaint:   Left lower extremity weakness; known hip fracture HPI:   83 yo male who sustained a mechanical fall back in August 2017.  Has had progressive difficulty with mobility and ambulation.  He reports left leg weakness, but suprisingly not a significant amount of pain.  X-rays of his left hip were obtained in the office today and showed a left subacute femoral neck fracture.  He is being admitted for surgery to address this fracture.  Past Medical History:  Diagnosis Date  . Arthritis   . Back pain    s/p lumbar injection 2014  . BENIGN PROSTATIC HYPERTROPHY 05/21/2007  . Bladder cancer ([place]) 10/2011  . CAD (coronary artery disease) CARDIOLOGIST- DR WALL - VISIT IN JUNE 2012 FOR CATH   nonobstructive by cath 6/12:  mid LAD 30%, proximal obtuse marginal-2 30%, proximal RCA 20%, mid RCA 30-40%.  He had normal cardiac output and mildly elevated filling pressures but no significant pulmonary hypertension;   Echocardiogram in May 2012 demonstrated EF 50-55% and left atrial enlargement   . Cellulitis of left foot    Hospitalized in 2006  . CHF (congestive heart failure) ([place])    per family, issues in 2012  . Chronic kidney disease (CKD), stage III (moderate) 12/04/2007   FOLLOWED BY DR PATEL  . Chronic pain of lower extremity   . Complication of anesthesia    1 time bladder cancer surgery- 2012 , oxygen satruratioon dropped had to stay overnight, no problems since then.  Marland Kitchen COPD (chronic obstructive pulmonary disease) ([place])   . DIABETES MELLITUS, TYPE II 05/21/2007  . DIVERTICULOSIS, COLON 05/21/2007   pt denies  . Dyspnea    with exertion, patient mointors saturation  . Fatigue AGE-RELATED  . Gross hematuria    pt denies  . HYPERTENSION 05/21/2007  . Impaired hearing BILATERAL HEARING AIDS  . On home oxygen therapy    "2L at nighttime" (02/28/2016)  . PAD (peripheral artery disease) ([place])   . Pneumonia    Hx  of   . Psoriasis ELBOWS  .  Psoriasis   . PSORIASIS, SCALP 10/25/2008    Past Surgical History:  Procedure Laterality Date  . AMPUTATION Left 03/27/2016   Procedure: partial first AMPUTATION RAY; LEFT;  Surgeon: Trula Slade, DPM;  Location: [facility];  Service: Podiatry;  Laterality: Left;  . AMPUTATION Left 06/12/2016   Procedure: TRANSMETATARSAL AMPUTATION LEFT FOOT;  Surgeon: Newt Minion, MD;  Location: [facility];  Service: Orthopedics;  Laterality: Left;  . APPENDECTOMY  1941  . CARDIAC CATHETERIZATION  04-24-11/  DR Rogue Jury ARIDA   MILD NONOBSTRUCTIVE CAD, NORMA CARDIAC OUTPUT  . CARDIOVASCULAR STRESS TEST  2007  . CATARACT EXTRACTION W/ INTRAOCULAR LENS  IMPLANT, BILATERAL Bilateral ~ 2010  . CYSTOSCOPY  12/25/2011   Procedure: CYSTOSCOPY;  Surgeon: Molli Hazard, MD;  Location: [facility];  Service: Urology;  Laterality: N/A;  needs intubation and to be paralyzed   . CYSTOSCOPY W/ RETROGRADES  10/30/2011   Procedure: CYSTOSCOPY WITH RETROGRADE PYELOGRAM;  Surgeon: Molli Hazard, MD;  Location: [facility];  Service: Urology;  Laterality: Bilateral;  CYSTOSCOPY POSS TURBT BILATERAL RETROGRADE PYLEOGRAM   . CYSTOSCOPY W/ RETROGRADES  11/30/2012   Procedure: CYSTOSCOPY WITH RETROGRADE PYELOGRAM;  Surgeon: Molli Hazard, MD;  Location: [facility];  Service: Urology;  Laterality: Bilateral;  Flexible cystoscopy.  . CYSTOSCOPY WITH BIOPSY  11/30/2012  Procedure: CYSTOSCOPY WITH BIOPSY;  Surgeon: Molli Hazard, MD;  Location: [facility];  Service: Urology;  Laterality: N/A;  . INCISION AND DRAINAGE FOOT Left ~ 2005   LEFT FOOT DUE TO INFECTION FROM  NAIL PUNCTURE INJURY  . LUMBAR LAMINECTOMY/DECOMPRESSION MICRODISCECTOMY N/A 10/18/2016   Procedure: LEFT AND CENTRAL L4-5 LUMBAR LAMINECTOMY WITH RESECTION OF SYNOVIAL CYST;  Surgeon: Jessy Oto, MD;  Location: [facility];  Service: Orthopedics;  Laterality: N/A;  . PENILE  PROSTHESIS IMPLANT  1990s  . PERIPHERAL VASCULAR CATHETERIZATION N/A 02/14/2016   Procedure: Abdominal Aortogram w/Lower Extremity;  Surgeon: Wellington Hampshire, MD;  Location: [facility];  Service: Cardiovascular;  Laterality: N/A;  . PERIPHERAL VASCULAR CATHETERIZATION Left 02/28/2016   Procedure: Peripheral Vascular Balloon Angioplasty;  Surgeon: Wellington Hampshire, MD;  Location: [place];  Service: Cardiovascular;  Laterality: Left;  left peroneal and popliteal artery  . [place]  . TRANSURETHRAL RESECTION OF BLADDER TUMOR  10/30/2011   Procedure: TRANSURETHRAL RESECTION OF BLADDER TUMOR (TURBT);  Surgeon: Molli Hazard, MD;  Location: [facility];  Service: Urology;  Laterality: N/A;  . TRANSURETHRAL RESECTION OF BLADDER TUMOR  12/25/2011   Procedure: TRANSURETHRAL RESECTION OF BLADDER TUMOR (TURBT);  Surgeon: Molli Hazard, MD;  Location: [facility];  Service: Urology;  Laterality: N/A;  need long gyrus instruments   . VASECTOMY  1990s    Family History  Problem Relation Age of Onset  . Diabetes Mother   . Heart disease Brother   . Heart disease Brother   . Heart disease Brother   . Heart disease Brother   . Heart disease Brother   . Heart disease Brother   . Lung cancer Brother    Social History:  reports that he quit smoking about 48 years ago. His smoking use included Cigarettes. He has a 44.00 pack-year smoking history. He has never used smokeless tobacco. He reports that he does not drink alcohol or use drugs.  Allergies:  Allergies  Allergen Reactions  . Actos [Pioglitazone Hydrochloride] Other (See Comments)    Held 2012 bladder cancer  . Aspirin Other (See Comments)    Held 2012 due to hematuria and bruising.   . Ace Inhibitors Cough  . Bactrim Rash and Other (See Comments)    Rash, presumed allergy  . Gabapentin Nausea And Vomiting    Upset stomach and diarrhea - tolerates low  doses  . Lyrica [Pregabalin] Swelling    swelling  . Pravastatin Swelling    Swelling of feet  . Sulfa Drugs Cross Reactors Rash    Medications Prior to Admission  Medication Sig Dispense Refill  . acetaminophen (TYLENOL) 500 MG tablet Take 1,000 mg by mouth every 8 (eight) hours as needed for mild pain, moderate pain or headache.    . albuterol (PROVENTIL) (2.5 MG/3ML) 0.083% nebulizer solution Take 3 mLs (2.5 mg total) by nebulization every 6 (six) hours as needed for wheezing or shortness of breath. 150 mL 2  . amLODipine (NORVASC) 5 MG tablet Take 1 tablet (5 mg total) by mouth daily.    Marland Kitchen atorvastatin (LIPITOR) 10 MG tablet Take 1 tablet (10 mg total) by mouth daily. 90 tablet 3  . budesonide-formoterol (SYMBICORT) 160-4.5 MCG/ACT inhaler Inhale 2 puffs into the lungs 2 (two) times daily as needed (shortness of breath).     . clobetasol (TEMOVATE) 0.05 % external solution Apply 1 application topically daily.    Marland Kitchen  clopidogrel (PLAVIX) 75 MG tablet Take 1 tablet by mouth  daily 90 tablet 1  . furosemide (LASIX) 40 MG tablet TAKE 1 TABLET BY MOUTH  EVERY MONDAY, WEDNESDAY,  AND FRIDAY 39 tablet 1  . gabapentin (NEURONTIN) 100 MG capsule Take 100 mg by mouth 3 (three) times daily.    Marland Kitchen glipiZIDE (GLUCOTROL XL) 5 MG 24 hr tablet TAKE 1 TABLET BY MOUTH  DAILY WITH BREAKFAST 90 tablet 1  . insulin glargine (LANTUS) 100 UNIT/ML injection Inject 0.15 mLs (15 Units total) into the skin daily. 10 mL   . insulin lispro (HUMALOG) 100 UNIT/ML injection Inject 10 Units into the skin before meals if sugar is >180 prior to the meal.  Inject 5 Units into the skin before meals if sugar is 140-179 prior to the meal.  If sugar <139 or lower before meal, then skip a dose. 10 mL 2  . oxyCODONE (ROXICODONE) 15 MG immediate release tablet     . OxyCODONE HCl 7.5 MG TABA Take 7.5-15 mg by mouth every 6 (six) hours as needed (pain. sedation caution). 40 tablet 0  . oxyCODONE-acetaminophen (PERCOCET) 5-325 MG  tablet Take 1-2 tablets by mouth every 4 (four) hours as needed. (Patient not taking: Reported on 12/26/2016) 10 tablet 0  . oxyCODONE-acetaminophen (PERCOCET) 7.5-325 MG tablet Take 1 tablet by mouth every 6 (six) hours as needed for severe pain. (Patient not taking: Reported on 12/26/2016) 40 tablet 0  . polyethylene glycol (MIRALAX / GLYCOLAX) packet Take 17 g by mouth daily as needed. (Patient taking differently: Take 17 g by mouth daily as needed for mild constipation. ) 14 each 0  . tiotropium (SPIRIVA) 18 MCG inhalation capsule Place 1 capsule (18 mcg total) into inhaler and inhale daily. 90 capsule 3  . traZODone (DESYREL) 50 MG tablet Take 0.5 tablets (25 mg total) by mouth at bedtime. (Patient not taking: Reported on 12/26/2016)      Results for orders placed or performed during the hospital encounter of 12/26/16 (from the past 48 hour(s))  Glucose, capillary     Status: Abnormal   Collection Time: 12/26/16  2:13 PM  Result Value Ref Range   Glucose-Capillary 124 (H) 65 - 99 mg/dL  CBC     Status: Abnormal   Collection Time: 12/26/16  2:43 PM  Result Value Ref Range   WBC 10.1 4.0 - 10.5 K/uL   RBC 4.13 (L) 4.22 - 5.81 MIL/uL   Hemoglobin 13.1 13.0 - 17.0 g/dL   HCT 39.8 39.0 - 52.0 %   MCV 96.4 78.0 - 100.0 fL   MCH 31.7 26.0 - 34.0 pg   MCHC 32.9 30.0 - 36.0 g/dL   RDW 14.1 11.5 - 15.5 %   Platelets 237 150 - 400 K/uL  Protime-INR     Status: None   Collection Time: 12/26/16  2:43 PM  Result Value Ref Range   Prothrombin Time 13.2 11.4 - 15.2 seconds   INR 1.00   Comprehensive metabolic panel     Status: Abnormal   Collection Time: 12/26/16  2:43 PM  Result Value Ref Range   Sodium 140 135 - 145 mmol/L   Potassium 4.2 3.5 - 5.1 mmol/L   Chloride 103 101 - 111 mmol/L   CO2 28 22 - 32 mmol/L   Glucose, Bld 138 (H) 65 - 99 mg/dL   BUN 51 (H) 6 - 20 mg/dL   Creatinine, Ser 1.94 (H) 0.61 - 1.24 mg/dL   Calcium 8.8 (L) 8.9 -  10.3 mg/dL   Total Protein 6.8 6.5 - 8.1 g/dL    Albumin 3.9 3.5 - 5.0 g/dL   AST 15 15 - 41 U/L   ALT 11 (L) 17 - 63 U/L   Alkaline Phosphatase 52 38 - 126 U/L   Total Bilirubin 0.4 0.3 - 1.2 mg/dL   GFR calc non Af Amer 30 (L) >60 mL/min   GFR calc Af Amer 34 (L) >60 mL/min    Comment: (NOTE) The eGFR has been calculated using the CKD EPI equation. This calculation has not been validated in all clinical situations. eGFR's persistently <60 mL/min signify possible Chronic Kidney Disease.    Anion gap 9 5 - 15  APTT     Status: None   Collection Time: 12/26/16  2:43 PM  Result Value Ref Range   aPTT 33 24 - 36 seconds  Type and screen [place]     Status: None   Collection Time: 12/26/16  2:55 PM  Result Value Ref Range   ABO/RH(D) A POS    Antibody Screen NEG    Sample Expiration 12/29/2016    Chest Portable 1 View  Result Date: 12/26/2016 CLINICAL DATA:  Preoperative examination. Patient for left hip replacement. EXAM: PORTABLE CHEST 1 VIEW COMPARISON:  Single view of the chest 06/10/2016. PA and lateral chest 01/20/2014. FINDINGS: The lungs appear emphysematous but are clear. Heart size is normal. No pneumothorax or pleural effusion. No focal bony abnormality. IMPRESSION: No acute disease.  The lungs appear emphysematous. Electronically Signed   By: Inge Rise M.D.   On: 12/26/2016 14:58   Xr Hip Unilat W Or W/o Pelvis 2-3 Views Left  Result Date: 12/26/2016 AP pelvis and lateral of the left hip show left hip fracture through the mid femoral neck with bone resorption and varus deformity. Shortening of the left femoral neck by 3/4 inch, loss of normal Shenton's line. Findings consistent with subacute or chronic left femoral neck fracture. Moderately severe narrowing of the inferomedial left hip joint line. Narrowing of the superolateral joint line though the superomedial joint line is well maintained.Right hip with decreased contour of the lateral head and neck, no lucency, mild degenerative changes of  the right hip.    Xr Knee 1-2 Views Left  Result Date: 12/26/2016 Left knee AP and lateral radiographs, show the medial joint line minimally narrowed, no significant osteophytes, lateral radiograph with patella baja no fracture, dislocation or subluxation. Minimal medial joint line narrowing, minimal degenerative changes.   Review of Systems  Musculoskeletal: Positive for back pain, falls and joint pain.  All other systems reviewed and are negative.   Blood pressure (!) 122/50, pulse 72, temperature 98.6 F (37 C), temperature source Oral, resp. rate 16, SpO2 100 %. Physical Exam  Constitutional: He is oriented to person, place, and time. He appears well-developed and well-nourished.  HENT:  Head: Normocephalic and atraumatic.  Eyes: EOM are normal. Pupils are equal, round, and reactive to light.  Neck: Normal range of motion. Neck supple.  Cardiovascular: Normal rate.   Respiratory: Effort normal and breath sounds normal.  GI: Soft. Bowel sounds are normal.  Musculoskeletal:       Left hip: He exhibits decreased range of motion, decreased strength, tenderness, bony tenderness and deformity.  Neurological: He is alert and oriented to person, place, and time.  Skin: Skin is warm and dry.  Psychiatric: He has a normal mood and affect.    His left hip rotation is limited. His left  leg is shorter than his right   Assessment/Plan Displaced subacute left hip femoral neck fracture 1) I have spoken to the patient and family in length and detail at the bedside and have recommended a left direct anterior total hip replacement.  The risks and benefits were discussed thoroughly.  Surgery will be tomorrow afternoon 2/16.  Mcarthur Rossetti, MD 12/26/2016, 5:30 PM

## 2016-12-27 ENCOUNTER — Inpatient Hospital Stay (HOSPITAL_COMMUNITY): Payer: Medicare Other | Admitting: Certified Registered Nurse Anesthetist

## 2016-12-27 ENCOUNTER — Encounter (HOSPITAL_COMMUNITY)
Admission: AD | Disposition: A | Discharge status: Skilled Nursing Facility | Payer: Self-pay | Source: Ambulatory Visit | Attending: Specialist

## 2016-12-27 ENCOUNTER — Inpatient Hospital Stay (HOSPITAL_COMMUNITY): Payer: Medicare Other

## 2016-12-27 ENCOUNTER — Encounter (HOSPITAL_COMMUNITY): Payer: Self-pay | Admitting: Certified Registered"

## 2016-12-27 ENCOUNTER — Encounter: Payer: Self-pay | Admitting: Family Medicine

## 2016-12-27 ENCOUNTER — Inpatient Hospital Stay (HOSPITAL_COMMUNITY): Admission: RE | Admit: 2016-12-27 | Payer: Medicare Other | Source: Ambulatory Visit | Admitting: Orthopaedic Surgery

## 2016-12-27 DIAGNOSIS — M12552 Traumatic arthropathy, left hip: Secondary | ICD-10-CM

## 2016-12-27 HISTORY — PX: TOTAL HIP ARTHROPLASTY: SHX124

## 2016-12-27 LAB — GLUCOSE, CAPILLARY
Glucose-Capillary: 114 mg/dL — ABNORMAL HIGH (ref 65–99)
Glucose-Capillary: 99 mg/dL (ref 65–99)
Glucose-Capillary: 87 mg/dL (ref 65–99)
Glucose-Capillary: 96 mg/dL (ref 65–99)
Glucose-Capillary: 120 mg/dL — ABNORMAL HIGH (ref 65–99)
Glucose-Capillary: 153 mg/dL — ABNORMAL HIGH (ref 65–99)

## 2016-12-27 LAB — HEMOGLOBIN A1C
Hgb A1c MFr Bld: 6.8 % — ABNORMAL HIGH (ref 4.8–5.6)
Mean Plasma Glucose: 148 mg/dL

## 2016-12-27 SURGERY — ARTHROPLASTY, HIP, TOTAL, ANTERIOR APPROACH
Anesthesia: General | Site: Hip | Laterality: Left

## 2016-12-27 MED ORDER — LIDOCAINE 2% (20 MG/ML) 5 ML SYRINGE
INTRAMUSCULAR | Status: DC | PRN
  Administered 2016-12-27: 100 mg via INTRAVENOUS

## 2016-12-27 MED ORDER — ALUM & MAG HYDROXIDE-SIMETH 200-200-20 MG/5ML PO SUSP: 30.0000 mL | ORAL | Status: DC | PRN

## 2016-12-27 MED ORDER — ONDANSETRON HCL 4 MG/2ML IJ SOLN: 4.0000 mg | Freq: Once | INTRAMUSCULAR | Status: DC | PRN

## 2016-12-27 MED ORDER — ONDANSETRON HCL 4 MG PO TABS: 4.0000 mg | ORAL_TABLET | Freq: Four times a day (QID) | ORAL | Status: DC | PRN

## 2016-12-27 MED ORDER — METOCLOPRAMIDE HCL 5 MG/ML IJ SOLN
5.0000 mg | Freq: Three times a day (TID) | INTRAMUSCULAR | Status: DC | PRN
  Filled 2016-12-27: qty 2

## 2016-12-27 MED ORDER — STERILE WATER FOR IRRIGATION IR SOLN
Status: DC | PRN
  Administered 2016-12-27: 2000 mL

## 2016-12-27 MED ORDER — SUGAMMADEX SODIUM 200 MG/2ML IV SOLN
INTRAVENOUS | Status: DC | PRN
  Administered 2016-12-27: 200 mg via INTRAVENOUS

## 2016-12-27 MED ORDER — HYDROMORPHONE HCL 1 MG/ML IJ SOLN
1.0000 mg | INTRAMUSCULAR | Status: DC | PRN
  Administered 2016-12-28 – 2016-12-30 (×3): 1 mg via INTRAVENOUS
  Filled 2016-12-27 (×3): qty 1

## 2016-12-27 MED ORDER — LACTATED RINGERS IV SOLN
INTRAVENOUS | Status: DC
  Administered 2016-12-27 (×2): via INTRAVENOUS

## 2016-12-27 MED ORDER — SODIUM CHLORIDE 0.9 % IR SOLN
Status: DC | PRN
  Administered 2016-12-27: 1000 mL

## 2016-12-27 MED ORDER — ROCURONIUM BROMIDE 10 MG/ML (PF) SYRINGE
PREFILLED_SYRINGE | INTRAVENOUS | Status: DC | PRN
  Administered 2016-12-27: 10 mg via INTRAVENOUS
  Administered 2016-12-27: 40 mg via INTRAVENOUS

## 2016-12-27 MED ORDER — ROCURONIUM BROMIDE 50 MG/5ML IV SOSY
PREFILLED_SYRINGE | INTRAVENOUS | Status: AC
  Filled 2016-12-27: qty 5

## 2016-12-27 MED ORDER — PHENYLEPHRINE HCL 10 MG/ML IJ SOLN
INTRAVENOUS | Status: DC | PRN
  Administered 2016-12-27: 40 ug/min via INTRAVENOUS

## 2016-12-27 MED ORDER — MIDAZOLAM HCL 2 MG/2ML IJ SOLN
INTRAMUSCULAR | Status: AC
  Filled 2016-12-27: qty 2

## 2016-12-27 MED ORDER — SODIUM CHLORIDE 0.9 % IV SOLN: INTRAVENOUS | Status: DC

## 2016-12-27 MED ORDER — ALBUMIN HUMAN 5 % IV SOLN
INTRAVENOUS | Status: DC | PRN
  Administered 2016-12-27 (×2): via INTRAVENOUS

## 2016-12-27 MED ORDER — FENTANYL CITRATE (PF) 100 MCG/2ML IJ SOLN
INTRAMUSCULAR | Status: AC
  Filled 2016-12-27: qty 2

## 2016-12-27 MED ORDER — FENTANYL CITRATE (PF) 100 MCG/2ML IJ SOLN
INTRAMUSCULAR | Status: DC | PRN
  Administered 2016-12-27: 75 ug via INTRAVENOUS
  Administered 2016-12-27: 25 ug via INTRAVENOUS

## 2016-12-27 MED ORDER — DEXTROSE 5 % AND 0.9 % NACL IV BOLUS
50.0000 mL | Freq: Once | INTRAVENOUS | Status: AC
  Administered 2016-12-27: 50 mL via INTRAVENOUS

## 2016-12-27 MED ORDER — CLOPIDOGREL BISULFATE 75 MG PO TABS
75.0000 mg | ORAL_TABLET | Freq: Every day | ORAL | Status: DC
  Administered 2016-12-28 – 2016-12-31 (×4): 75 mg via ORAL
  Filled 2016-12-27 (×4): qty 1

## 2016-12-27 MED ORDER — ALBUMIN HUMAN 5 % IV SOLN
INTRAVENOUS | Status: AC
Start: 2016-12-27 — End: 2016-12-27
  Filled 2016-12-27: qty 500

## 2016-12-27 MED ORDER — ONDANSETRON HCL 4 MG/2ML IJ SOLN
4.0000 mg | Freq: Four times a day (QID) | INTRAMUSCULAR | Status: DC | PRN
  Administered 2016-12-27 – 2016-12-28 (×2): 4 mg via INTRAVENOUS
  Filled 2016-12-27 (×2): qty 2

## 2016-12-27 MED ORDER — PHENOL 1.4 % MT LIQD
1.0000 | OROMUCOSAL | Status: DC | PRN
  Filled 2016-12-27: qty 177

## 2016-12-27 MED ORDER — LIP MEDEX EX OINT
TOPICAL_OINTMENT | CUTANEOUS | Status: AC
  Administered 2016-12-27: 1
  Filled 2016-12-27: qty 7

## 2016-12-27 MED ORDER — METHOCARBAMOL 500 MG PO TABS
500.0000 mg | ORAL_TABLET | Freq: Four times a day (QID) | ORAL | Status: DC | PRN
  Administered 2016-12-28 – 2016-12-30 (×3): 500 mg via ORAL
  Filled 2016-12-27 (×4): qty 1

## 2016-12-27 MED ORDER — PHENYLEPHRINE 40 MCG/ML (10ML) SYRINGE FOR IV PUSH (FOR BLOOD PRESSURE SUPPORT)
PREFILLED_SYRINGE | INTRAVENOUS | Status: AC
  Filled 2016-12-27: qty 10

## 2016-12-27 MED ORDER — METHOCARBAMOL 1000 MG/10ML IJ SOLN
500.0000 mg | Freq: Four times a day (QID) | INTRAMUSCULAR | Status: DC | PRN
  Administered 2016-12-27: 500 mg via INTRAVENOUS
  Filled 2016-12-27: qty 5
  Filled 2016-12-27: qty 550

## 2016-12-27 MED ORDER — CEFAZOLIN SODIUM-DEXTROSE 2-4 GM/100ML-% IV SOLN
INTRAVENOUS | Status: AC
  Filled 2016-12-27: qty 100

## 2016-12-27 MED ORDER — FENTANYL CITRATE (PF) 100 MCG/2ML IJ SOLN
25.0000 ug | INTRAMUSCULAR | Status: DC | PRN
  Administered 2016-12-27 (×2): 25 ug via INTRAVENOUS

## 2016-12-27 MED ORDER — DOCUSATE SODIUM 100 MG PO CAPS
100.0000 mg | ORAL_CAPSULE | Freq: Two times a day (BID) | ORAL | Status: DC
  Administered 2016-12-27 – 2016-12-31 (×8): 100 mg via ORAL
  Filled 2016-12-27 (×8): qty 1

## 2016-12-27 MED ORDER — ACETAMINOPHEN 650 MG RE SUPP: 650.0000 mg | Freq: Four times a day (QID) | RECTAL | Status: DC | PRN

## 2016-12-27 MED ORDER — SUGAMMADEX SODIUM 200 MG/2ML IV SOLN
INTRAVENOUS | Status: AC
  Filled 2016-12-27: qty 2

## 2016-12-27 MED ORDER — CEFAZOLIN SODIUM-DEXTROSE 2-3 GM-% IV SOLR
INTRAVENOUS | Status: DC | PRN
  Administered 2016-12-27: 2 g via INTRAVENOUS

## 2016-12-27 MED ORDER — ACETAMINOPHEN 325 MG PO TABS: 650.0000 mg | ORAL_TABLET | Freq: Four times a day (QID) | ORAL | Status: DC | PRN

## 2016-12-27 MED ORDER — PROPOFOL 10 MG/ML IV BOLUS
INTRAVENOUS | Status: AC
  Filled 2016-12-27: qty 20

## 2016-12-27 MED ORDER — ONDANSETRON HCL 4 MG/2ML IJ SOLN
INTRAMUSCULAR | Status: AC
  Filled 2016-12-27: qty 2

## 2016-12-27 MED ORDER — EPHEDRINE 5 MG/ML INJ
INTRAVENOUS | Status: AC
  Filled 2016-12-27: qty 10

## 2016-12-27 MED ORDER — PROPOFOL 10 MG/ML IV BOLUS
INTRAVENOUS | Status: DC | PRN
  Administered 2016-12-27: 130 mg via INTRAVENOUS

## 2016-12-27 MED ORDER — METOCLOPRAMIDE HCL 5 MG PO TABS: 5.0000 mg | ORAL_TABLET | Freq: Three times a day (TID) | ORAL | Status: DC | PRN

## 2016-12-27 MED ORDER — OXYCODONE HCL 5 MG PO TABS
5.0000 mg | ORAL_TABLET | ORAL | Status: DC | PRN
  Administered 2016-12-27 – 2016-12-30 (×7): 5 mg via ORAL
  Administered 2016-12-30 (×2): 10 mg via ORAL
  Administered 2016-12-30: 5 mg via ORAL
  Administered 2016-12-31: 10 mg via ORAL
  Administered 2016-12-31: 5 mg via ORAL
  Filled 2016-12-27: qty 1
  Filled 2016-12-27: qty 2
  Filled 2016-12-27: qty 1
  Filled 2016-12-27: qty 2
  Filled 2016-12-27 (×4): qty 1
  Filled 2016-12-27: qty 2

## 2016-12-27 MED ORDER — MIDAZOLAM HCL 2 MG/2ML IJ SOLN
INTRAMUSCULAR | Status: DC | PRN
  Administered 2016-12-27: 0.5 mg via INTRAVENOUS

## 2016-12-27 MED ORDER — ONDANSETRON HCL 4 MG/2ML IJ SOLN
INTRAMUSCULAR | Status: DC | PRN
  Administered 2016-12-27: 4 mg via INTRAVENOUS

## 2016-12-27 MED ORDER — ENSURE ENLIVE PO LIQD
237.0000 mL | Freq: Two times a day (BID) | ORAL | Status: DC
  Administered 2016-12-28 – 2016-12-31 (×7): 237 mL via ORAL

## 2016-12-27 MED ORDER — MENTHOL 3 MG MT LOZG: 1.0000 | LOZENGE | OROMUCOSAL | Status: DC | PRN

## 2016-12-27 MED ORDER — EPHEDRINE SULFATE-NACL 50-0.9 MG/10ML-% IV SOSY
PREFILLED_SYRINGE | INTRAVENOUS | Status: DC | PRN
  Administered 2016-12-27: 5 mg via INTRAVENOUS
  Administered 2016-12-27: 10 mg via INTRAVENOUS

## 2016-12-27 MED ORDER — MEPERIDINE HCL 50 MG/ML IJ SOLN: 6.2500 mg | INTRAMUSCULAR | Status: DC | PRN

## 2016-12-27 MED ORDER — CEFAZOLIN IN D5W 1 GM/50ML IV SOLN
1.0000 g | Freq: Four times a day (QID) | INTRAVENOUS | Status: AC
  Administered 2016-12-27 – 2016-12-28 (×2): 1 g via INTRAVENOUS
  Filled 2016-12-27 (×2): qty 50

## 2016-12-27 SURGICAL SUPPLY — 37 items
APL SKNCLS STERI-STRIP NONHPOA (GAUZE/BANDAGES/DRESSINGS) ×1
BAG SPEC THK2 15X12 ZIP CLS (MISCELLANEOUS)
BAG ZIPLOCK 12X15 (MISCELLANEOUS) IMPLANT
BENZOIN TINCTURE PRP APPL 2/3 (GAUZE/BANDAGES/DRESSINGS) ×1 IMPLANT
BLADE SAW SGTL 18X1.27X75 (BLADE) ×2 IMPLANT
CAPT HIP TOTAL 2 ×1 IMPLANT
CELLS DAT CNTRL 66122 CELL SVR (MISCELLANEOUS) ×1 IMPLANT
CLOTH BEACON ORANGE TIMEOUT ST (SAFETY) ×2 IMPLANT
COVER PERINEAL POST (MISCELLANEOUS) ×2 IMPLANT
DRAPE STERI IOBAN 125X83 (DRAPES) ×2 IMPLANT
DRAPE U-SHAPE 47X51 STRL (DRAPES) ×4 IMPLANT
DRSG AQUACEL AG ADV 3.5X10 (GAUZE/BANDAGES/DRESSINGS) ×2 IMPLANT
DURAPREP 26ML APPLICATOR (WOUND CARE) ×2 IMPLANT
ELECT REM PT RETURN 9FT ADLT (ELECTROSURGICAL) ×2
ELECTRODE REM PT RTRN 9FT ADLT (ELECTROSURGICAL) ×1 IMPLANT
GAUZE XEROFORM 1X8 LF (GAUZE/BANDAGES/DRESSINGS) IMPLANT
GLOVE BIO SURGEON STRL SZ7.5 (GLOVE) ×2 IMPLANT
GLOVE BIOGEL PI IND STRL 8 (GLOVE) ×2 IMPLANT
GLOVE BIOGEL PI INDICATOR 8 (GLOVE) ×2
GLOVE ECLIPSE 8.0 STRL XLNG CF (GLOVE) ×2 IMPLANT
GOWN STRL REUS W/TWL XL LVL3 (GOWN DISPOSABLE) ×4 IMPLANT
HANDPIECE INTERPULSE COAX TIP (DISPOSABLE) ×2
HOLDER FOLEY CATH W/STRAP (MISCELLANEOUS) ×2 IMPLANT
PACK ANTERIOR HIP CUSTOM (KITS) ×2 IMPLANT
RETRACTOR WND ALEXIS 18 MED (MISCELLANEOUS) ×1 IMPLANT
RTRCTR WOUND ALEXIS 18CM MED (MISCELLANEOUS) ×2
SET HNDPC FAN SPRY TIP SCT (DISPOSABLE) ×1 IMPLANT
STAPLER VISISTAT 35W (STAPLE) ×1 IMPLANT
STRIP CLOSURE SKIN 1/2X4 (GAUZE/BANDAGES/DRESSINGS) ×1 IMPLANT
SUT ETHIBOND NAB CT1 #1 30IN (SUTURE) ×2 IMPLANT
SUT MNCRL AB 4-0 PS2 18 (SUTURE) ×1 IMPLANT
SUT VIC AB 0 CT1 36 (SUTURE) ×2 IMPLANT
SUT VIC AB 1 CT1 36 (SUTURE) ×2 IMPLANT
SUT VIC AB 2-0 CT1 27 (SUTURE) ×4
SUT VIC AB 2-0 CT1 TAPERPNT 27 (SUTURE) ×2 IMPLANT
TRAY FOLEY W/METER SILVER 16FR (SET/KITS/TRAYS/PACK) ×1 IMPLANT
YANKAUER SUCT BULB TIP 10FT TU (MISCELLANEOUS) ×2 IMPLANT

## 2016-12-27 NOTE — Progress Notes (Signed)
Date: December 27, 2016 Discharge orders checked for needs. Patient has used [facility] in the past. Velva Harman, RN, BSN, [place]   [phone]

## 2016-12-27 NOTE — Telephone Encounter (Signed)
Currently admitted.  I deferred this rx for now, until he is outpatient.  Notify his wife that I saw his inpatient admission papers and I will be following along. I wish him all the best in the meantime. Thanks.

## 2016-12-27 NOTE — Transfer of Care (Signed)
Immediate Anesthesia Transfer of Care Note  Patient: [name] Small  Procedure(s) Performed: Procedure(s): LEFT TOTAL HIP ARTHROPLASTY ANTERIOR APPROACH (Left)  Patient Location: PACU  Anesthesia Type:General  Level of Consciousness: sedated  Airway & Oxygen Therapy: Patient Spontanous Breathing and Patient connected to nasal cannula oxygen  Post-op Assessment: Report given to RN and Post -op Vital signs reviewed and stable  Post vital signs: Reviewed and stable  Last Vitals:  Vitals:   12/27/16 1000 12/27/16 1358  BP: 111/61 (!) 137/51  Pulse: 66 73  Resp: 16 16  Temp: 36.4 C 36.4 C    Last Pain:  Vitals:   12/27/16 1358  TempSrc: Oral  PainSc:       Patients Stated Pain Goal: 2 (86/57/84 6962)  Complications: No apparent anesthesia complications

## 2016-12-27 NOTE — Telephone Encounter (Signed)
Aware via [place]

## 2016-12-27 NOTE — Anesthesia Preprocedure Evaluation (Addendum)
Anesthesia Evaluation  Patient identified by MRN, date of birth, ID band Patient awake    History of Anesthesia Complications Negative for: history of anesthetic complications  Airway Mallampati: II  TM Distance: >3 FB Neck ROM: Full    Dental  (+) Edentulous Upper, Edentulous Lower   Pulmonary shortness of breath, former smoker,    breath sounds clear to auscultation       Cardiovascular hypertension, + CAD, + Peripheral Vascular Disease, +CHF and + DOE   Rhythm:Regular Rate:Normal     Neuro/Psych PSYCHIATRIC DISORDERS    GI/Hepatic   Endo/Other  diabetes  Renal/GU Renal disease     Musculoskeletal  (+) Arthritis ,   Abdominal   Peds  Hematology   Anesthesia Other Findings COPD DM Hb 13.1 HTN; last echo 55% EF. No sx of CHF since last surgery.  Appeared to do well with last GA; Patient  feels state of health is similar since back surgery. No rales or wheezes  edentulous  Reproductive/Obstetrics                            Anesthesia Physical  Anesthesia Plan  ASA: III  Anesthesia Plan: General   Post-op Pain Management:    Induction: Intravenous  Airway Management Planned: Oral ETT  Additional Equipment:   Intra-op Plan:   Post-operative Plan: Extubation in OR  Informed Consent: I have reviewed the patients History and Physical, chart, labs and discussed the procedure including the risks, benefits and alternatives for the proposed anesthesia with the patient or authorized representative who has indicated his/her understanding and acceptance.   Dental advisory given  Plan Discussed with:   Anesthesia Plan Comments:         Anesthesia Quick Evaluation

## 2016-12-27 NOTE — Brief Op Note (Signed)
12/26/2016 - 12/27/2016  4:46 PM  PATIENT:  [name] Small  83 y.o. male  PRE-OPERATIVE DIAGNOSIS:  left hip fracture with osteoarthritis  POST-OPERATIVE DIAGNOSIS:  left hip fracture with osteoarthritis  PROCEDURE:  Procedure(s): LEFT TOTAL HIP ARTHROPLASTY ANTERIOR APPROACH (Left)  SURGEON:  Surgeon(s) and Role:    * Mcarthur Rossetti, MD - Primary  PHYSICIAN ASSISTANT: Benita Stabile, PA-C  ANESTHESIA:   general  EBL:  Total I/O In: 500 [IV Piggyback:500] Out: 8546 [Urine:975; Blood:500]  COUNTS:  YES  DICTATION: .Other Dictation: Dictation Number [id number]  PLAN OF CARE: Admit to inpatient   PATIENT DISPOSITION:  PACU - hemodynamically stable.   Delay start of Pharmacological VTE agent (>24hrs) due to surgical blood loss or risk of bleeding: no

## 2016-12-27 NOTE — Anesthesia Procedure Notes (Signed)
Procedure Name: Intubation Date/Time: 12/27/2016 3:26 PM Performed by: Cynda Familia Pre-anesthesia Checklist: Patient identified, Emergency Drugs available, Suction available and Patient being monitored Patient Re-evaluated:Patient Re-evaluated prior to inductionOxygen Delivery Method: Circle System Utilized Preoxygenation: Pre-oxygenation with 100% oxygen Intubation Type: IV induction Ventilation: Mask ventilation without difficulty Laryngoscope Size: Miller and 2 Grade View: Grade I Tube type: Oral Number of attempts: 1 Airway Equipment and Method: Stylet Placement Confirmation: ETT inserted through vocal cords under direct vision,  positive ETCO2 and breath sounds checked- equal and bilateral Secured at: 23 cm Tube secured with: Tape Dental Injury: Teeth and Oropharynx as per pre-operative assessment  Comments: Smooth IV induction Glennon Mac--- intubation AM CRNA atraumatic-- mouth as preop--- bilat BS Glennon Mac

## 2016-12-27 NOTE — Progress Notes (Signed)
Initial Nutrition Assessment  DOCUMENTATION CODES:   Non-severe (moderate) malnutrition in context of chronic illness  INTERVENTION:   Ensure Enlive po BID, each supplement provides 350 kcal and 20 grams of protein  NUTRITION DIAGNOSIS:   Malnutrition related to chronic illness as evidenced by moderate depletion of body fat, severe depletion of muscle mass.  GOAL:   Patient will meet greater than or equal to 90% of their needs  MONITOR:   PO intake, Supplement acceptance, Labs, Weight trends, Skin  REASON FOR ASSESSMENT:   Malnutrition Screening Tool    ASSESSMENT:   83 yo male who sustained a mechanical fall back in August 2017.  Has had progressive difficulty with mobility and ambulation.  He reports left leg weakness, but suprisingly not a significant amount of pain.  X-rays of his left hip were obtained in the office today and showed a left subacute femoral neck fracture.  He is being admitted for surgery to address this fracture.  Met with pt in room today. Pt reports good appetite pta and currently. Pt reports that he is starving and would like to eat today but pt NPO for surgery. Pt with moderate to severe muscle wasting over entire body and sacral wound. RD discussed with pt the importance of adequate protein intake for healing. RD will order supplements for pt post op. RD obtained bed weight on pt today of 97.4kg. Per chart, pt appears to be weight stable.   Medications reviewed and include: lasix, insulin, miralax  Labs reviewed: BUN 51(H), creat 1.94(H), Ca 8.8(L)  Nutrition-Focused physical exam completed. Findings are severe fat depletion in upper arms, severe muscle depletion over entire body, and no edema.   Diet Order:  Diet NPO time specified  Skin:  Wound (see comment) (sacral wound)  Last BM:  2/13  Height:   Ht Readings from Last 1 Encounters:  12/27/16 6' 2.02" (1.88 m)    Weight:   Wt Readings from Last 1 Encounters:  12/27/16 214 lb 11.7 oz  (97.4 kg)    Ideal Body Weight:  86.3 kg  BMI:  Body mass index is 27.56 kg/m.  Estimated Nutritional Needs:   Kcal:  2150-2550kcal/day   Protein:  117-136g/day  Fluid:  >2L/day   EDUCATION NEEDS:   No education needs identified at this time  Koleen Distance, RD, LDN Pager #- [phone]

## 2016-12-28 ENCOUNTER — Inpatient Hospital Stay (HOSPITAL_COMMUNITY): Payer: Medicare Other

## 2016-12-28 DIAGNOSIS — R0902 Hypoxemia: Secondary | ICD-10-CM

## 2016-12-28 DIAGNOSIS — J9601 Acute respiratory failure with hypoxia: Secondary | ICD-10-CM

## 2016-12-28 DIAGNOSIS — E440 Moderate protein-calorie malnutrition: Secondary | ICD-10-CM | POA: Insufficient documentation

## 2016-12-28 LAB — BASIC METABOLIC PANEL
Anion gap: 12 (ref 5–15)
Anion gap: 10 (ref 5–15)
BUN: 53 mg/dL — ABNORMAL HIGH (ref 6–20)
BUN: 57 mg/dL — ABNORMAL HIGH (ref 6–20)
CO2: 24 mmol/L (ref 22–32)
CO2: 24 mmol/L (ref 22–32)
Calcium: 7.9 mg/dL — ABNORMAL LOW (ref 8.9–10.3)
Calcium: 7.6 mg/dL — ABNORMAL LOW (ref 8.9–10.3)
Chloride: 106 mmol/L (ref 101–111)
Chloride: 105 mmol/L (ref 101–111)
Creatinine, Ser: 2.57 mg/dL — ABNORMAL HIGH (ref 0.61–1.24)
Creatinine, Ser: 2.83 mg/dL — ABNORMAL HIGH (ref 0.61–1.24)
GFR calc Af Amer: 24 mL/min — ABNORMAL LOW (ref >60)
GFR calc Af Amer: 22 mL/min — ABNORMAL LOW (ref >60)
GFR calc non Af Amer: 21 mL/min — ABNORMAL LOW (ref >60)
GFR calc non Af Amer: 19 mL/min — ABNORMAL LOW (ref >60)
Glucose, Bld: 253 mg/dL — ABNORMAL HIGH (ref 65–99)
Glucose, Bld: 269 mg/dL — ABNORMAL HIGH (ref 65–99)
Potassium: 5.9 mmol/L — ABNORMAL HIGH (ref 3.5–5.1)
Potassium: 5.1 mmol/L (ref 3.5–5.1)
Sodium: 142 mmol/L (ref 135–145)
Sodium: 139 mmol/L (ref 135–145)

## 2016-12-28 LAB — CBC
HCT: 29.1 % — ABNORMAL LOW (ref 39.0–52.0)
Hemoglobin: 9.4 g/dL — ABNORMAL LOW (ref 13.0–17.0)
MCH: 32 pg (ref 26.0–34.0)
MCHC: 32.3 g/dL (ref 30.0–36.0)
MCV: 99 fL (ref 78.0–100.0)
Platelets: 210 10*3/uL (ref 150–400)
RBC: 2.94 MIL/uL — ABNORMAL LOW (ref 4.22–5.81)
RDW: 14.3 % (ref 11.5–15.5)
WBC: 11.9 10*3/uL — ABNORMAL HIGH (ref 4.0–10.5)

## 2016-12-28 LAB — URINALYSIS, ROUTINE W REFLEX MICROSCOPIC
Bilirubin Urine: NEGATIVE
Glucose, UA: NEGATIVE mg/dL
Ketones, ur: 5 mg/dL — AB
Nitrite: NEGATIVE
Protein, ur: 30 mg/dL — AB
Specific Gravity, Urine: 1.017 (ref 1.005–1.030)
pH: 5 (ref 5.0–8.0)

## 2016-12-28 LAB — GLUCOSE, CAPILLARY
Glucose-Capillary: 273 mg/dL — ABNORMAL HIGH (ref 65–99)
Glucose-Capillary: 270 mg/dL — ABNORMAL HIGH (ref 65–99)
Glucose-Capillary: 170 mg/dL — ABNORMAL HIGH (ref 65–99)
Glucose-Capillary: 142 mg/dL — ABNORMAL HIGH (ref 65–99)

## 2016-12-28 MED ORDER — INSULIN GLARGINE 100 UNIT/ML ~~LOC~~ SOLN
10.0000 [IU] | Freq: Every day | SUBCUTANEOUS | Status: DC
  Filled 2016-12-28: qty 0.1

## 2016-12-28 MED ORDER — INSULIN GLARGINE 100 UNIT/ML ~~LOC~~ SOLN
8.0000 [IU] | Freq: Every day | SUBCUTANEOUS | Status: DC
  Administered 2016-12-28 – 2016-12-31 (×4): 8 [IU] via SUBCUTANEOUS
  Filled 2016-12-28 (×4): qty 0.08

## 2016-12-28 MED ORDER — SODIUM POLYSTYRENE SULFONATE 15 GM/60ML PO SUSP
30.0000 g | Freq: Once | ORAL | Status: AC
  Administered 2016-12-28: 30 g via ORAL
  Filled 2016-12-28: qty 120

## 2016-12-28 MED ORDER — SODIUM CHLORIDE 0.9 % IV SOLN
INTRAVENOUS | Status: AC
  Administered 2016-12-28: 13:00:00 via INTRAVENOUS
  Filled 2016-12-28: qty 1000

## 2016-12-28 NOTE — Care Management Note (Signed)
Case Management Note  Patient Details  Name: [name] Small MRN: [id number] Date of Birth: 26-May-1928  Subjective/Objective:    Left THA                Action/Plan: Discharge Planning:  NCM spoke to wife, [name]. States pt has RW, and 3n1 bedside commode at home. Pt active with [facility].   PCP Tonia Ghent MD  Expected Discharge Date: 12/29/2016             Expected Discharge Plan:  [place]  In-House Referral:  NA  Discharge planning Services  CM Consult  Post Acute Care Choice:  Home Health Choice offered to:  Spouse  DME Arranged:  N/A DME Agency:  NA  HH Arranged:  PT [place] Agency:  [facility]  Status of Service:  Completed, signed off  If discussed at [employer] of Stay Meetings, dates discussed:    Additional Comments:  Erenest Rasher, RN 12/28/2016, 11:08 AM

## 2016-12-28 NOTE — Progress Notes (Signed)
Pt with Potassium level of 5.9, BUN 53, Crea 2.57 this am, pt asymptomatic at this time. Paged [employer] on-call physician. Dr. Marlou Sa returned the page. Received new orders to give pt Kayexalate 30 gm x1 now and repeat BMP 4 hours after medication is administered.

## 2016-12-28 NOTE — Consult Note (Signed)
Medical Consultation  [name] Small WCB:[id number] DOB: Feb 02, 1928 DOA: 12/26/2016 PCP: [name], MD   Requesting physician: Dr. Marlou Sa Reason for consultation: medical management, hyperkalemia  Impression/Recommendations Acute on chronic renal failure--CKD stage III -Secondary to hemodynamic changes, particularly low blood pressure -Anticipate some worsening before improvement -Hoping for serum creatinine to reach a plateau in next 24 to 48 hours -Renal ultrasound -Urinalysis -Avoid nephrotoxic agents -hold lasix -Baseline creatinine 1.8-2.1  Hyperkalemia -Improved with Kayexalate -A.m. BMP  Relative hypotension -Likely secondary to opioids -Improving with IV fluids -Continue IV fluids another 12 hours -Discontinue amlodipine  Pyuria -12/26/2016 urinalysis--TNTC WBC -Patient is asymptomatic -Repeat UA and urine culture  Diabetes mellitus type 2, insulin-dependent -12/26/2016 hemoglobin A1c 6.8 -Start reduced dose Lantus -Continue NovoLog sliding scale -Holding glipizide  COPD -Appears stable without wheezing -Order chest x-ray--suspect atelectasis -Flutter valve -Continue Breo/Spiriva  Hyperlipidemia -Continue statin  CAD -no chest pain -continue plavix -EKG reassuring  Chronic diastolic CHF -Appears clinically compensated -Daily weights -Holding furosemide temporarily secondary to acute on chronic renal failure   HPI:  [name] Small is an 83 y.o. male with a history of nonobstructive CAD, hypertension, CKD stage III, diabetes mellitus, COPD on intermittent oxygen was admitted to the hospital on 12/26/2016 for left total hip arthroplasty. The patient sustained a mechanical fall back in August 2017 and has had progressive difficulty with ambulation and mobility. Dr. Santiago Bumpers performed left total hip arthroplasty on 12/27/2016. On the evening of 12/27/2016, the patient had a number of soft and low blood pressure readings with systolic  blood pressure ranging from 85 to the low 90s. Remarkably the patient has done very well postoperatively.  Patient denies fevers, chills, headache, chest pain, dyspnea,  vomiting, diarrhea, abdominal pain, dysuria, hematuria, hematochezia, and melena. He only complains of some nausea and postoperative pain. Routine lab work on 12/28/2016 showed worsening serum creatinine up to 2.57 with potassium 5.9 although it was a partly hemolyzed sample. The patient received Kayexalate 15 g. Repeat BMP showed potassium 5.1 with serum creatinine 2.3. The patient's CBGs have been running in the upper 200s. As a result, internal medicine consultation was obtained to assist with management.    Past Medical History:  Diagnosis Date  . Arthritis   . Back pain    s/p lumbar injection 2014  . BENIGN PROSTATIC HYPERTROPHY 05/21/2007  . Bladder cancer ([place]) 10/2011  . CAD (coronary artery disease) CARDIOLOGIST- DR WALL - VISIT IN JUNE 2012 FOR CATH   nonobstructive by cath 6/12:  mid LAD 30%, proximal obtuse marginal-2 30%, proximal RCA 20%, mid RCA 30-40%.  He had normal cardiac output and mildly elevated filling pressures but no significant pulmonary hypertension;   Echocardiogram in May 2012 demonstrated EF 50-55% and left atrial enlargement   . Cellulitis of left foot    Hospitalized in 2006  . CHF (congestive heart failure) ([place])    per family, issues in 2012  . Chronic kidney disease (CKD), stage III (moderate) 12/04/2007   FOLLOWED BY DR PATEL  . Chronic pain of lower extremity   . Complication of anesthesia    1 time bladder cancer surgery- 2012 , oxygen satruratioon dropped had to stay overnight, no problems since then.  Marland Kitchen COPD (chronic obstructive pulmonary disease) ([place])   . DIABETES MELLITUS, TYPE II 05/21/2007  . DIVERTICULOSIS, COLON 05/21/2007   pt denies  . Dyspnea    with exertion, patient mointors saturation  . Fatigue AGE-RELATED  . Gross hematuria    pt  denies  . HYPERTENSION 05/21/2007    . Impaired hearing BILATERAL HEARING AIDS  . On home oxygen therapy    "2L at nighttime" (02/28/2016)  . PAD (peripheral artery disease) ([place])   . Pneumonia    Hx  of   . Psoriasis ELBOWS  . Psoriasis   . PSORIASIS, SCALP 10/25/2008   Past Surgical History:  Procedure Laterality Date  . AMPUTATION Left 03/27/2016   Procedure: partial first AMPUTATION RAY; LEFT;  Surgeon: Trula Slade, DPM;  Location: [facility];  Service: Podiatry;  Laterality: Left;  . AMPUTATION Left 06/12/2016   Procedure: TRANSMETATARSAL AMPUTATION LEFT FOOT;  Surgeon: Newt Minion, MD;  Location: [facility];  Service: Orthopedics;  Laterality: Left;  . APPENDECTOMY  1941  . CARDIAC CATHETERIZATION  04-24-11/  DR Rogue Jury ARIDA   MILD NONOBSTRUCTIVE CAD, NORMA CARDIAC OUTPUT  . CARDIOVASCULAR STRESS TEST  2007  . CATARACT EXTRACTION W/ INTRAOCULAR LENS  IMPLANT, BILATERAL Bilateral ~ 2010  . CYSTOSCOPY  12/25/2011   Procedure: CYSTOSCOPY;  Surgeon: Molli Hazard, MD;  Location: [facility];  Service: Urology;  Laterality: N/A;  needs intubation and to be paralyzed   . CYSTOSCOPY W/ RETROGRADES  10/30/2011   Procedure: CYSTOSCOPY WITH RETROGRADE PYELOGRAM;  Surgeon: Molli Hazard, MD;  Location: [facility];  Service: Urology;  Laterality: Bilateral;  CYSTOSCOPY POSS TURBT BILATERAL RETROGRADE PYLEOGRAM   . CYSTOSCOPY W/ RETROGRADES  11/30/2012   Procedure: CYSTOSCOPY WITH RETROGRADE PYELOGRAM;  Surgeon: Molli Hazard, MD;  Location: [facility];  Service: Urology;  Laterality: Bilateral;  Flexible cystoscopy.  . CYSTOSCOPY WITH BIOPSY  11/30/2012   Procedure: CYSTOSCOPY WITH BIOPSY;  Surgeon: Molli Hazard, MD;  Location: [facility];  Service: Urology;  Laterality: N/A;  . INCISION AND DRAINAGE FOOT Left ~ 2005   LEFT FOOT DUE TO INFECTION FROM  NAIL PUNCTURE INJURY  . LUMBAR LAMINECTOMY/DECOMPRESSION MICRODISCECTOMY N/A 10/18/2016    Procedure: LEFT AND CENTRAL L4-5 LUMBAR LAMINECTOMY WITH RESECTION OF SYNOVIAL CYST;  Surgeon: Jessy Oto, MD;  Location: [place];  Service: Orthopedics;  Laterality: N/A;  . PENILE PROSTHESIS IMPLANT  1990s  . PERIPHERAL VASCULAR CATHETERIZATION N/A 02/14/2016   Procedure: Abdominal Aortogram w/Lower Extremity;  Surgeon: Wellington Hampshire, MD;  Location: [place] [facility];  Service: Cardiovascular;  Laterality: N/A;  . PERIPHERAL VASCULAR CATHETERIZATION Left 02/28/2016   Procedure: Peripheral Vascular Balloon Angioplasty;  Surgeon: Wellington Hampshire, MD;  Location: [place] [facility];  Service: Cardiovascular;  Laterality: Left;  left peroneal and popliteal artery  . [place]  . TRANSURETHRAL RESECTION OF BLADDER TUMOR  10/30/2011   Procedure: TRANSURETHRAL RESECTION OF BLADDER TUMOR (TURBT);  Surgeon: Molli Hazard, MD;  Location: [facility];  Service: Urology;  Laterality: N/A;  . TRANSURETHRAL RESECTION OF BLADDER TUMOR  12/25/2011   Procedure: TRANSURETHRAL RESECTION OF BLADDER TUMOR (TURBT);  Surgeon: Molli Hazard, MD;  Location: [facility];  Service: Urology;  Laterality: N/A;  need long gyrus instruments   . VASECTOMY  1990s   Social History:  reports that he quit smoking about 48 years ago. His smoking use included Cigarettes. He has a 44.00 pack-year smoking history. He has never used smokeless tobacco. He reports that he does not drink alcohol or use drugs.  Family History  Problem Relation Age of Onset  . Diabetes Mother   . Heart disease Brother   . Heart  disease Brother   . Heart disease Brother   . Heart disease Brother   . Heart disease Brother   . Heart disease Brother   . Lung cancer Brother     Allergies  Allergen Reactions  . Actos [Pioglitazone Hydrochloride] Other (See Comments)    Held 2012 bladder cancer  . Aspirin Other (See Comments)    Held 2012 due to hematuria and bruising.   .  Ace Inhibitors Cough  . Bactrim Rash and Other (See Comments)    Rash, presumed allergy  . Gabapentin Nausea And Vomiting    Upset stomach and diarrhea - tolerates low doses  . Lyrica [Pregabalin] Swelling    swelling  . Pravastatin Swelling    Swelling of feet  . Sulfa Drugs Cross Reactors Rash     Prior to Admission medications   Medication Sig Start Date End Date Taking? Authorizing Provider  acetaminophen (TYLENOL) 500 MG tablet Take 1,000 mg by mouth every 8 (eight) hours as needed for mild pain, moderate pain or headache.   Yes Historical Provider, MD  amLODipine (NORVASC) 2.5 MG tablet Take 2.5 mg by mouth daily.  12/18/16  Yes Historical Provider, MD  atorvastatin (LIPITOR) 10 MG tablet Take 1 tablet (10 mg total) by mouth daily. 02/23/16  Yes Wellington Hampshire, MD  clobetasol (TEMOVATE) 0.05 % external solution Apply 1 application topically daily.   Yes Historical Provider, MD  clopidogrel (PLAVIX) 75 MG tablet Take 1 tablet by mouth  daily 07/28/16  Yes Tonia Ghent, MD  furosemide (LASIX) 40 MG tablet TAKE 1 TABLET BY MOUTH  EVERY MONDAY, WEDNESDAY,  AND FRIDAY 06/27/16  Yes Tonia Ghent, MD  glipiZIDE (GLUCOTROL XL) 5 MG 24 hr tablet TAKE 1 TABLET BY MOUTH  DAILY WITH BREAKFAST 10/01/16  Yes Tonia Ghent, MD  insulin glargine (LANTUS) 100 UNIT/ML injection Inject 0.15 mLs (15 Units total) into the skin daily. 10/17/16  Yes Tonia Ghent, MD  insulin lispro (HUMALOG) 100 UNIT/ML injection Inject 10 Units into the skin before meals if sugar is >180 prior to the meal.  Inject 5 Units into the skin before meals if sugar is 140-179 prior to the meal.  If sugar <139 or lower before meal, then skip a dose. 11/13/16  Yes Tonia Ghent, MD  OxyCODONE HCl 7.5 MG TABA Take 7.5-15 mg by mouth every 6 (six) hours as needed (pain. sedation caution). 12/16/16  Yes Tonia Ghent, MD  tiotropium (SPIRIVA) 18 MCG inhalation capsule Place 1 capsule (18 mcg total) into inhaler and inhale  daily. 06/21/16  Yes Tonia Ghent, MD  albuterol (PROVENTIL) (2.5 MG/3ML) 0.083% nebulizer solution Take 3 mLs (2.5 mg total) by nebulization every 6 (six) hours as needed for wheezing or shortness of breath. 06/21/16   Tonia Ghent, MD  budesonide-formoterol [facility]) 160-4.5 MCG/ACT inhaler Inhale 2 puffs into the lungs 2 (two) times daily as needed (shortness of breath).  01/21/14   Tonia Ghent, MD  polyethylene glycol [facility] Floria Raveling) packet Take 17 g by mouth daily as needed. Patient taking differently: Take 17 g by mouth daily as needed for mild constipation.  03/29/16   Oswald Hillock, MD    Inpatient Medications:   Scheduled Meds: . atorvastatin  10 mg Oral q1800  . clobetasol  1 application Topical Daily  . clopidogrel  75 mg Oral Daily  . docusate sodium  100 mg Oral BID  . feeding supplement (ENSURE ENLIVE)  237 mL  Oral BID BM  . fluticasone furoate-vilanterol  1 puff Inhalation Daily  . furosemide  80 mg Oral Once per day on Mon Wed Fri  . insulin aspart  0-15 Units Subcutaneous TID WC  . insulin aspart  4 Units Subcutaneous TID WC  . insulin glargine  10 Units Subcutaneous Daily  . polyethylene glycol  17 g Oral Daily  . tiotropium  18 mcg Inhalation Daily  . traZODone  25 mg Oral QHS   Continuous Infusions: . sodium chloride 0.9 % 1,000 mL infusion       Review of Systems:  Constitutional:  No weight loss, night sweats, Fevers, chills, fatigue.  Head&Eyes: No headache.  No vision loss.  No eye pain or scotoma ENT:  No Difficulty swallowing,Tooth/dental problems,Sore throat,  No ear ache  Cardio-vascular:  No chest pain, Orthopnea, PND, swelling in lower extremities,  dizziness, palpitations  GI:  No heartburn,  abdominal pain, nausea, vomiting, diarrhea, loss of appetite, hematochezia, melena Resp:  No shortness of breath with exertion or at rest. no productive cough,  No coughing up of blood.No wheezing.No chest wall deformity  Skin:  no rash or  lesions.  GU:  no dysuria, no hematuria, no urgency or frequency. No flank pain.  Musculoskeletal:   No decreased range of motion. No back pain.  Psych:  No change in mood or affect. No depression or anxiety. Neurologic: No headache, no dysesthesia, no focal weakness, no vision loss. No syncope    Physical Exam: Vitals:   12/28/16 0833 12/28/16 0835 12/28/16 0940 12/28/16 0945  BP:   (!) 97/37 (!) 118/54  Pulse:   79   Resp:   18   Temp:   97.8 F (36.6 C)   TempSrc:   Oral   SpO2: 95% 95% 98%   Weight:      Height:       General:  A&O x 3, NAD, nontoxic, pleasant/cooperative Head/Eye: No conjunctival hemorrhage, no icterus, [place]/AT, No nystagmus ENT:  No icterus,  No thrush, good dentition, no pharyngeal exudate Neck:  No masses, no lymphadenpathy, no bruits CV:  RRR, no rub, no gallop, no S3 Lung:  Bibasilar crackles, no wheeze Abdomen: soft/NT, +BS, nondistended, no peritoneal signs Ext: No cyanosis, No rashes, No petechiae, No lymphangitis, No edema Neuro: CNII-XII intact, strength 4/5 in bilateral upper and lower extremities, no dysmetria  Labs on Admission:  Basic Metabolic Panel:  Recent Labs Lab 12/26/16 1443 12/28/16 0438 12/28/16 1148  NA 140 142 139  K 4.2 5.9* 5.1  CL 103 106 105  CO2 28 24 24   GLUCOSE 138* 253* 269*  BUN 51* 53* 57*  CREATININE 1.94* 2.57* 2.83*  CALCIUM 8.8* 7.9* 7.6*   Liver Function Tests:  Recent Labs Lab 12/26/16 1443  AST 15  ALT 11*  ALKPHOS 52  BILITOT 0.4  PROT 6.8  ALBUMIN 3.9   No results for input(s): LIPASE, AMYLASE in the last 168 hours. No results for input(s): AMMONIA in the last 168 hours. CBC:  Recent Labs Lab 12/26/16 1443 12/28/16 0438  WBC 10.1 11.9*  HGB 13.1 9.4*  HCT 39.8 29.1*  MCV 96.4 99.0  PLT 237 210   Cardiac Enzymes: No results for input(s): CKTOTAL, CKMB, CKMBINDEX, TROPONINI in the last 168 hours. BNP: Invalid input(s): POCBNP CBG:  Recent Labs Lab 12/27/16 1507  12/27/16 1759 12/27/16 2123 12/28/16 0757 12/28/16 1215  GLUCAP 96 120* 153* 273* 270*   D-Dimer No results for input(s): DDIMER in the last  72 hours. Hgb A1c  Recent Labs  12/26/16 1443  HGBA1C 6.8*   Lipid Profile No results for input(s): CHOL, HDL, LDLCALC, TRIG, CHOLHDL, LDLDIRECT in the last 72 hours. Thyroid function studies No results for input(s): TSH, T4TOTAL, T3FREE, THYROIDAB in the last 72 hours.  Invalid input(s): FREET3 Anemia work up No results for input(s): VITAMINB12, FOLATE, FERRITIN, TIBC, IRON, RETICCTPCT in the last 72 hours. Urinalysis    Component Value Date/Time   COLORURINE YELLOW 12/26/2016 1802   APPEARANCEUR TURBID (A) 12/26/2016 1802   LABSPEC 1.015 12/26/2016 1802   PHURINE 5.0 12/26/2016 1802   GLUCOSEU NEGATIVE 12/26/2016 1802   HGBUR MODERATE (A) 12/26/2016 1802   HGBUR trace-intact 11/25/2007 0937   BILIRUBINUR NEGATIVE 12/26/2016 1802   [place] 12/26/2016 1802   PROTEINUR 30 (A) 12/26/2016 1802   UROBILINOGEN 0.2 01/18/2014 0719   NITRITE NEGATIVE 12/26/2016 1802   LEUKOCYTESUR LARGE (A) 12/26/2016 1802   Sepsis Labs Invalid input(s): PROCALCITONIN,  WBC,  LACTICIDVEN Microbiology Recent Results (from the past 240 hour(s))  Surgical PCR screen     Status: None   Collection Time: 12/26/16  5:19 PM  Result Value Ref Range Status   MRSA, PCR NEGATIVE NEGATIVE Final   Staphylococcus aureus NEGATIVE NEGATIVE Final    Comment:        The Xpert SA Assay (FDA approved for NASAL specimens in patients over 46 years of age), is one component of a comprehensive surveillance program.  Test performance has been validated by [facility] for patients greater than or equal to 87 year old. It is not intended to diagnose infection nor to guide or monitor treatment.     Radiological Exams on Admission: Dg Pelvis Portable  Result Date: 12/27/2016 CLINICAL DATA:  83 year old male status post left hip replacement. Initial  encounter. EXAM: PORTABLE PELVIS 1-2 VIEWS COMPARISON:  Pelvis/ left hip radiographs 12/26/2016. FINDINGS: Portable AP supine view at 1734 hours. Left bipolar hip arthroplasty now in place. Hardware appears intact and normally aligned in the AP view. The pelvis elsewhere appears stable. Incidental penile implant. No unexpected osseous changes identified. Postoperative changes to the soft tissues surrounding the left hip including subcutaneous gas. IMPRESSION: Left bipolar hip arthroplasty with no adverse features. Electronically Signed   By: Genevie Ann M.D.   On: 12/27/2016 17:53   Chest Portable 1 View  Result Date: 12/26/2016 CLINICAL DATA:  Preoperative examination. Patient for left hip replacement. EXAM: PORTABLE CHEST 1 VIEW COMPARISON:  Single view of the chest 06/10/2016. PA and lateral chest 01/20/2014. FINDINGS: The lungs appear emphysematous but are clear. Heart size is normal. No pneumothorax or pleural effusion. No focal bony abnormality. IMPRESSION: No acute disease.  The lungs appear emphysematous. Electronically Signed   By: Inge Rise M.D.   On: 12/26/2016 14:58   Dg C-arm 61-120 Min-no Report  Result Date: 12/27/2016 Fluoroscopy was utilized by the requesting physician.  No radiographic interpretation.   Dg Hip Operative Unilat With Pelvis Left  Result Date: 12/27/2016 CLINICAL DATA:  Status post left hip replacement. EXAM: DG C-ARM 61-120 MIN-NO REPORT; OPERATIVE LEFT HIP WITH PELVIS FLUOROSCOPY TIME:  38 seconds. COMPARISON:  Radiographs of December 26, 2016. FINDINGS: Two intraoperative fluoroscopic images of the left hip demonstrate the patient be status post left total hip arthroplasty. The femoral and acetabular components are well situated. No dislocation is noted. Expected postoperative changes are seen involving the surrounding soft tissues. IMPRESSION: Status post left total hip arthroplasty. Electronically Signed   By: Jeneen Rinks  Murlean Caller, M.D.   On: 12/27/2016 16:56       Thank you for allowing [place] to participate in the care of your patient.  We will continue to follow.  Time spent: 65 min  Jansel Vonstein [facility] Pager [phone]  If 7PM-7AM, please contact night-coverage [email or web address] Password [email or web address] 12/28/2016, 1:06 PM

## 2016-12-28 NOTE — Progress Notes (Signed)
PT Cancellation Note  Patient Details Name: [name] Small MRN: [id number] DOB: 1927/11/12   Cancelled Treatment:    Reason Eval/Treat Not Completed: Medical issues which prohibited therapy. Will return later for bed exercises only. Rn recommends to hold on OOB today.   Claretha Cooper 12/28/2016, 12:49 PM Tresa Endo PT [phone]

## 2016-12-28 NOTE — Progress Notes (Signed)
Subjective: Patient doing reasonably well.   Objective: Vital signs in last 24 hours: Temp:  [97.3 F (36.3 C)-98.4 F (36.9 C)] 97.8 F (36.6 C) (02/17 0940) Pulse Rate:  [73-98] 79 (02/17 0940) Resp:  [8-18] 18 (02/17 0940) BP: (86-137)/(37-62) 118/54 (02/17 0945) SpO2:  [91 %-98 %] 98 % (02/17 0940) Weight:  [214 lb 11.7 oz (97.4 kg)] 214 lb 11.7 oz (97.4 kg) (02/16 1256)  Intake/Output from previous day: 02/16 0701 - 02/17 0700 In: 2901.3 [P.O.:360; I.V.:1886.3; IV Piggyback:655] Out: 1850 [Urine:1350; Blood:500] Intake/Output this shift: Total I/O In: 825 [P.O.:600; I.V.:225] Out: 0   Exam:  Leg lengths approximately equal foot dorsiflexion intact  Labs:  Recent Labs  12/26/16 1443 12/28/16 0438  HGB 13.1 9.4*    Recent Labs  12/26/16 1443 12/28/16 0438  WBC 10.1 11.9*  RBC 4.13* 2.94*  HCT 39.8 29.1*  PLT 237 210    Recent Labs  12/26/16 1443 12/28/16 0438  NA 140 142  K 4.2 5.9*  CL 103 106  CO2 28 24  BUN 51* 53*  CREATININE 1.94* 2.57*  GLUCOSE 138* 253*  CALCIUM 8.8* 7.9*    Recent Labs  12/26/16 1443  INR 1.00    Assessment/Plan: Patient doing well from orthopedic standpoint; however his creatinine has increased and his potassium was high this morning.  I have given him a dose of Kayexalate.  Repeat labs pending.  He is a complicated medical patient and thus I will obtain internal medicine consult for postoperative management assistance   G Alphonzo Severance 12/28/2016, 12:06 PM

## 2016-12-28 NOTE — Evaluation (Signed)
Physical Therapy Evaluation Patient Details Name: [name] Small MRN: [id number] DOB: July 29, 1928 Today's Date: 12/28/2016   History of Present Illness  Left hip fracture x several month duraion. s/P DA THA 12/27/16, H/O L/ trans metatarsal amputation  Clinical Impression  The patient tolerated standing with RW.  Continue PT as medical allows.Pt admitted with above diagnosis. Pt currently with functional limitations due to the deficits listed below (see PT Problem List).  Pt will benefit from skilled PT to increase their independence and safety with mobility to allow discharge to the venue listed below.       Follow Up Recommendations Home health PT;Supervision/Assistance - 24 hour    Equipment Recommendations  None recommended by PT    Recommendations for Other Services       Precautions / Restrictions Precautions Precautions: Fall Restrictions LLE Weight Bearing: Weight bearing as tolerated      Mobility  Bed Mobility Overal bed mobility: Needs Assistance Bed Mobility: Supine to Sit;Sit to Supine   Sidelying to sit: Mod assist;HOB elevated Supine to sit: Mod assist     General bed mobility comments: assist with trunk and left leg  Transfers Overall transfer level: Needs assistance Equipment used: Rolling walker (2 wheeled) Transfers: Sit to/from Stand Sit to Stand: Mod assist;+2 safety/equipment;+2 physical assistance;From elevated surface         General transfer comment: difficulty to stand  first trial, then stood with 2 assist, side steps along the bed .  Ambulation/Gait                Stairs            Wheelchair Mobility    Modified Rankin (Stroke Patients Only)       Balance                                             Pertinent Vitals/Pain Pain Assessment: 0-10 Pain Score: 1  Pain Location: l hip Pain Descriptors / Indicators: Sore Pain Intervention(s): Monitored during session    Home Living  Family/patient expects to be discharged to:: Private residence Living Arrangements: Spouse/significant other Available Help at Discharge: Family;Available 24 hours/day Type of Home: House Home Access: Stairs to enter Entrance Stairs-Rails: Left Entrance Stairs-Number of Steps: 4 Home Layout: One level Home Equipment: Walker - 2 wheels;Bedside commode Additional Comments: lift chair    Prior Function Level of Independence: Needs assistance   Gait / Transfers Assistance Needed: has required RW, sleeps in lift chair           Hand Dominance        Extremity/Trunk Assessment   Upper Extremity Assessment Upper Extremity Assessment: Defer to OT evaluation    Lower Extremity Assessment Lower Extremity Assessment: RLE deficits/detail;LLE deficits/detail RLE Sensation: history of peripheral neuropathy LLE Deficits / Details: hip flexion to 7o in sitting with assist, L trans met LLE Sensation: history of peripheral neuropathy    Cervical / Trunk Assessment Cervical / Trunk Assessment: Kyphotic  Communication      Cognition Arousal/Alertness: Awake/alert Behavior During Therapy: WFL for tasks assessed/performed Overall Cognitive Status: Within Functional Limits for tasks assessed                      General Comments      Exercises Total Joint Exercises Short Arc Quad: AROM;Both;20 reps Heel Slides: AROM;Both;20 reps Hip ABduction/ADduction: AROM;Both;20  reps   Assessment/Plan    PT Assessment Patient needs continued PT services  PT Problem List Decreased strength;Decreased range of motion;Decreased activity tolerance;Decreased mobility;Decreased balance;Decreased safety awareness;Decreased knowledge of use of DME;Decreased knowledge of precautions;Pain;Impaired sensation          PT Treatment Interventions DME instruction;Gait training;Stair training;Therapeutic activities;Functional mobility training;Patient/family education    PT Goals (Current goals  can be found in the Care Plan section)  Acute Rehab PT Goals Patient Stated Goal: to get up, walk with no pain PT Goal Formulation: With patient/family Time For Goal Achievement: 01/04/17 Potential to Achieve Goals: Good    Frequency 7X/week   Barriers to discharge        Co-evaluation               End of Session   Activity Tolerance: Patient tolerated treatment well Patient left: in bed;with call bell/phone within reach;with family/visitor present Nurse Communication: Mobility status         Time: 3475-8307 PT Time Calculation (min) (ACUTE ONLY): 31 min   Charges:   PT Evaluation $PT Eval Low Complexity: 1 Procedure PT Treatments $Therapeutic Activity: 8-22 mins   PT G Codes:        Claretha Cooper 12/28/2016, 5:05 PM Tresa Endo PT [phone]

## 2016-12-29 DIAGNOSIS — E440 Moderate protein-calorie malnutrition: Secondary | ICD-10-CM

## 2016-12-29 DIAGNOSIS — N183 Chronic kidney disease, stage 3 (moderate): Secondary | ICD-10-CM

## 2016-12-29 DIAGNOSIS — I10 Essential (primary) hypertension: Secondary | ICD-10-CM

## 2016-12-29 DIAGNOSIS — S72002A Fracture of unspecified part of neck of left femur, initial encounter for closed fracture: Principal | ICD-10-CM

## 2016-12-29 DIAGNOSIS — E875 Hyperkalemia: Secondary | ICD-10-CM

## 2016-12-29 DIAGNOSIS — I5032 Chronic diastolic (congestive) heart failure: Secondary | ICD-10-CM

## 2016-12-29 DIAGNOSIS — Z8781 Personal history of (healed) traumatic fracture: Secondary | ICD-10-CM

## 2016-12-29 DIAGNOSIS — R0902 Hypoxemia: Secondary | ICD-10-CM

## 2016-12-29 DIAGNOSIS — N179 Acute kidney failure, unspecified: Secondary | ICD-10-CM

## 2016-12-29 DIAGNOSIS — J438 Other emphysema: Secondary | ICD-10-CM

## 2016-12-29 LAB — GLUCOSE, CAPILLARY
Glucose-Capillary: 162 mg/dL — ABNORMAL HIGH (ref 65–99)
Glucose-Capillary: 156 mg/dL — ABNORMAL HIGH (ref 65–99)
Glucose-Capillary: 221 mg/dL — ABNORMAL HIGH (ref 65–99)
Glucose-Capillary: 119 mg/dL — ABNORMAL HIGH (ref 65–99)

## 2016-12-29 LAB — CBC
HCT: 23.8 % — ABNORMAL LOW (ref 39.0–52.0)
Hemoglobin: 7.9 g/dL — ABNORMAL LOW (ref 13.0–17.0)
MCH: 32.1 pg (ref 26.0–34.0)
MCHC: 33.2 g/dL (ref 30.0–36.0)
MCV: 96.7 fL (ref 78.0–100.0)
Platelets: 184 10*3/uL (ref 150–400)
RBC: 2.46 MIL/uL — ABNORMAL LOW (ref 4.22–5.81)
RDW: 14.5 % (ref 11.5–15.5)
WBC: 9.6 10*3/uL (ref 4.0–10.5)

## 2016-12-29 LAB — PREPARE RBC (CROSSMATCH)

## 2016-12-29 LAB — BASIC METABOLIC PANEL
Anion gap: 7 (ref 5–15)
BUN: 57 mg/dL — ABNORMAL HIGH (ref 6–20)
CO2: 27 mmol/L (ref 22–32)
Calcium: 7.4 mg/dL — ABNORMAL LOW (ref 8.9–10.3)
Chloride: 105 mmol/L (ref 101–111)
Creatinine, Ser: 2.71 mg/dL — ABNORMAL HIGH (ref 0.61–1.24)
GFR calc Af Amer: 23 mL/min — ABNORMAL LOW (ref >60)
GFR calc non Af Amer: 20 mL/min — ABNORMAL LOW (ref >60)
Glucose, Bld: 177 mg/dL — ABNORMAL HIGH (ref 65–99)
Potassium: 4.6 mmol/L (ref 3.5–5.1)
Sodium: 139 mmol/L (ref 135–145)

## 2016-12-29 LAB — CK: Total CK: 173 U/L (ref 49–397)

## 2016-12-29 MED ORDER — SODIUM CHLORIDE 0.9 % IV SOLN
Freq: Once | INTRAVENOUS | Status: AC
  Administered 2016-12-29: 13:00:00 via INTRAVENOUS

## 2016-12-29 NOTE — Progress Notes (Signed)
Physical Therapy Treatment Patient Details Name: [name] Small MRN: [id number] DOB: [birth year] Today's Date: 12/29/2016    History of Present Illness Left hip fracture x several month duraion. s/P DA THA 12/27/16, H/O L/ trans metatarsal amputation    PT Comments    Receiving 1 unit of blood. Bedside therapeutic exercises performed. Continue with ambulation in AM. May benefit from post acute rehab venue.  Follow Up Recommendations  ;CIR;     Equipment Recommendations  None recommended by PT    Recommendations for Other Services Rehab consult     Precautions / Restrictions Precautions Precautions: Fall Precaution Comments: needs orthotic shoes for transmet amp on lt Restrictions LLE Weight Bearing: Weight bearing as tolerated    Mobility  Bed Mobility                  Transfers                    Ambulation/Gait                 Stairs            Wheelchair Mobility    Modified Rankin (Stroke Patients Only)       Balance Overall balance assessment: History of Falls;Needs assistance Sitting-balance support: Feet supported;Bilateral upper extremity supported Sitting balance-Leahy Scale: Fair     Standing balance support: During functional activity;Bilateral upper extremity supported Standing balance-Leahy Scale: Poor                      Cognition Arousal/Alertness: Awake/alert                     General Comments: HOH    Exercises Total Joint Exercises Ankle Circles/Pumps: AROM;20 reps Short Arc Quad: AROM;Left;20 reps Heel Slides: AAROM;Left;20 reps Hip ABduction/ADduction: AAROM;Left;20 reps    General Comments        Pertinent Vitals/Pain Pain Score: 1  Pain Descriptors / Indicators: Sore Pain Intervention(s): Monitored during session;Premedicated before session;Ice applied    Home Living                      Prior Function            PT Goals (current goals can now be found in  the care plan section) Progress towards PT goals: Progressing toward goals    Frequency    7X/week      PT Plan Discharge plan needs to be updated    Co-evaluation             End of Session Equipment Utilized During Treatment: Gait belt Activity Tolerance: Patient tolerated treatment well Patient left: in bed;with bed alarm set;with call bell/phone within reach;with family/visitor present     Time: 7124-5809 PT Time Calculation (min) (ACUTE ONLY): 14 min  Charges:  $Gait Training: 8-22 mins $Therapeutic Exercise: 8-22 mins                    G Codes:      Claretha Cooper 12/29/2016, 5:16 PM

## 2016-12-29 NOTE — Progress Notes (Signed)
PROGRESS NOTE  [name] Small  YBO:[id number] DOB: August 24, 1928 DOA: 12/26/2016 PCP: [name], MD Outpatient Specialists:  Subjective: This is seen with his wife and daughter at bedside. Denies any complaints this morning. Discontinue Foley catheter and voiding trial if not able to void we'll place the catheter back again.  Brief Narrative:  [name] Small is an 83 y.o. male with a history of nonobstructive CAD, hypertension, CKD stage III, diabetes mellitus, COPD on intermittent oxygen was admitted to the hospital on 12/26/2016 for left total hip arthroplasty. The patient sustained a mechanical fall back in August 2017 and has had progressive difficulty with ambulation and mobility. Dr. Santiago Bumpers performed left total hip arthroplasty on 12/27/2016. On the evening of 12/27/2016, the patient had a number of soft and low blood pressure readings with systolic blood pressure ranging from 85 to the low 90s. Remarkably the patient has done very well postoperatively.  Patient denies fevers, chills, headache, chest pain, dyspnea,  vomiting, diarrhea, abdominal pain, dysuria, hematuria, hematochezia, and melena. He only complains of some nausea and postoperative pain. Routine lab work on 12/28/2016 showed worsening serum creatinine up to 2.57 with potassium 5.9 although it was a partly hemolyzed sample. The patient received Kayexalate 15 g. Repeat BMP showed potassium 5.1 with serum creatinine 2.3. The patient's CBGs have been running in the upper 200s. As a result, internal medicine consultation was obtained to assist with management.  Assessment & Plan:   Principal Problem:   Closed left hip fracture, initial encounter ([place]) Active Problems:   Type 2 diabetes mellitus with diabetic polyneuropathy (HCC)   Hyperkalemia   Chronic diastolic CHF (congestive heart failure) (HCC)   HTN (hypertension)   COPD (chronic obstructive pulmonary disease) (HCC)   Acute renal failure superimposed on  stage 3 chronic kidney disease (HCC)   Status post lumbar laminectomy   S/p left hip fracture   Malnutrition of moderate degree   Hypoxia   Acute on chronic renal failure--CKD stage III -Secondary to hemodynamic changes, particularly low blood pressure -Anticipate some worsening before improvement -Hoping for serum creatinine to reach a plateau in next 24 to 48 hours -Creatinine is 2.7 (baseline is around 2).  Bladder cancer -Patient has known history of bladder cancer, renal ultrasound showed bladder lesion. -Recommended to follow-up with urology ASAP  Hyperkalemia -This is resolved with Kayexalate, potassium went up to 5.9 currently 4.6.  Relative hypotension -Likely secondary to opioids -This is resolved with IV fluid hydration.  Diabetes mellitus type 2, insulin-dependent -12/26/2016 hemoglobin A1c 6.8 -Start reduced dose Lantus -Continue NovoLog sliding scale -Holding glipizide  COPD -Appears stable without wheezing -Order chest x-ray--suspect atelectasis -Flutter valve -Continue Breo/Spiriva  Hyperlipidemia -Continue statin  CAD -no chest pain -continue plavix -EKG reassuring  Chronic diastolic CHF -Appears clinically compensated -Daily weights -Holding furosemide temporarily secondary to acute on chronic renal failure  Anemia -Acute postoperative blood loss anemia, transfuse 1 unit of packed RBCs.  DVT prophylaxis: Subcutaneous heparin Code Status: Full Code Family Communication:  Disposition Plan:  Diet: Diet Carb Modified Fluid consistency: Thin; Room service appropriate? Yes  Consultants:   TRH  Procedures:   Left THA  Antimicrobials:   Perioperative cefazolin  Objective: Vitals:   12/28/16 2211 12/29/16 0618 12/29/16 0851 12/29/16 0852  BP: (!) 114/48 (!) 122/54    Pulse: 80 79    Resp: 18 18    Temp: 97.8 F (36.6 C) 98.2 F (36.8 C)    TempSrc: Oral Oral    SpO2: 98%  94% (!) 88% 95%  Weight:  97.9 kg (215 lb 13.3 oz)     Height:        Intake/Output Summary (Last 24 hours) at 12/29/16 1213 Last data filed at 12/29/16 1030  Gross per 24 hour  Intake          1196.25 ml  Output             1835 ml  Net          -638.75 ml   Filed Weights   12/27/16 1256 12/28/16 1400 12/29/16 0618  Weight: 97.4 kg (214 lb 11.7 oz) 97.6 kg (215 lb 2.7 oz) 97.9 kg (215 lb 13.3 oz)    Examination: General exam: Appears calm and comfortable  Respiratory system: Clear to auscultation. Respiratory effort normal. Cardiovascular system: S1 & S2 heard, RRR. No JVD, murmurs, rubs, gallops or clicks. No pedal edema. Gastrointestinal system: Abdomen is nondistended, soft and nontender. No organomegaly or masses felt. Normal bowel sounds heard. Central nervous system: Alert and oriented. No focal neurological deficits. Extremities: Symmetric 5 x 5 power. Skin: No rashes, lesions or ulcers Psychiatry: Judgement and insight appear normal. Mood & affect appropriate.   Data Reviewed: I have personally reviewed following labs and imaging studies  CBC:  Recent Labs Lab 12/26/16 1443 12/28/16 0438 12/29/16 0424  WBC 10.1 11.9* 9.6  HGB 13.1 9.4* 7.9*  HCT 39.8 29.1* 23.8*  MCV 96.4 99.0 96.7  PLT 237 210 440   Basic Metabolic Panel:  Recent Labs Lab 12/26/16 1443 12/28/16 0438 12/28/16 1148 12/29/16 0424  NA 140 142 139 139  K 4.2 5.9* 5.1 4.6  CL 103 106 105 105  CO2 28 24 24 27   GLUCOSE 138* 253* 269* 177*  BUN 51* 53* 57* 57*  CREATININE 1.94* 2.57* 2.83* 2.71*  CALCIUM 8.8* 7.9* 7.6* 7.4*   GFR: Estimated Creatinine Clearance: 22.7 mL/min (by C-G formula based on SCr of 2.71 mg/dL (H)). Liver Function Tests:  Recent Labs Lab 12/26/16 1443  AST 15  ALT 11*  ALKPHOS 52  BILITOT 0.4  PROT 6.8  ALBUMIN 3.9   No results for input(s): LIPASE, AMYLASE in the last 168 hours. No results for input(s): AMMONIA in the last 168 hours. Coagulation Profile:  Recent Labs Lab 12/26/16 1443  INR 1.00    Cardiac Enzymes:  Recent Labs Lab 12/29/16 0424  CKTOTAL 173   BNP (last 3 results) No results for input(s): PROBNP in the last 8760 hours. HbA1C:  Recent Labs  12/26/16 1443  HGBA1C 6.8*   CBG:  Recent Labs Lab 12/28/16 1215 12/28/16 1642 12/28/16 2207 12/29/16 0729 12/29/16 1204  GLUCAP 270* 170* 142* 162* 156*   Lipid Profile: No results for input(s): CHOL, HDL, LDLCALC, TRIG, CHOLHDL, LDLDIRECT in the last 72 hours. Thyroid Function Tests: No results for input(s): TSH, T4TOTAL, FREET4, T3FREE, THYROIDAB in the last 72 hours. Anemia Panel: No results for input(s): VITAMINB12, FOLATE, FERRITIN, TIBC, IRON, RETICCTPCT in the last 72 hours. Urine analysis:    Component Value Date/Time   COLORURINE YELLOW 12/28/2016 1520   APPEARANCEUR TURBID (A) 12/28/2016 1520   LABSPEC 1.017 12/28/2016 1520   PHURINE 5.0 12/28/2016 1520   GLUCOSEU NEGATIVE 12/28/2016 1520   HGBUR MODERATE (A) 12/28/2016 1520   HGBUR trace-intact 11/25/2007 0937   BILIRUBINUR NEGATIVE 12/28/2016 1520   KETONESUR 5 (A) 12/28/2016 1520   PROTEINUR 30 (A) 12/28/2016 1520   UROBILINOGEN 0.2 01/18/2014 0719   NITRITE NEGATIVE 12/28/2016 1520  LEUKOCYTESUR LARGE (A) 12/28/2016 1520   Sepsis Labs: @LABRCNTIP (procalcitonin:4,lacticidven:4)  ) Recent Results (from the past 240 hour(s))  Surgical PCR screen     Status: None   Collection Time: 12/26/16  5:19 PM  Result Value Ref Range Status   MRSA, PCR NEGATIVE NEGATIVE Final   Staphylococcus aureus NEGATIVE NEGATIVE Final    Comment:        The Xpert SA Assay (FDA approved for NASAL specimens in patients over 52 years of age), is one component of a comprehensive surveillance program.  Test performance has been validated by [facility] for patients greater than or equal to 4 year old. It is not intended to diagnose infection nor to guide or monitor treatment.      Invalid input(s): PROCALCITONIN, LACTICACIDVEN   Radiology  Studies: US Renal  Result Date: 12/28/2016 CLINICAL DATA:  Patient with elevated creatinine. EXAM: RENAL / URINARY TRACT ULTRASOUND COMPLETE COMPARISON:  CT abdomen pelvis 09/04/2011 FINDINGS: Right Kidney: Length: 12.5 cm. Renal cortical thinning. No hydronephrosis. Mild increased renal cortical echogenicity. Left Kidney: Length: 13.1 cm. Renal cortical thinning. No hydronephrosis. Mild increased renal cortical echogenicity. Bladder: Masslike echogenic material within the urinary bladder. IMPRESSION: Masslike echogenic material within the posterior aspect of the urinary bladder. While this may represent intraluminal debris, bladder mass cannot be excluded. Consider correlation with urinalysis and direct visualization. No hydronephrosis. Renal cortical thinning. Electronically Signed   By: Lovey Newcomer M.D.   On: 12/28/2016 15:25   Dg Pelvis Portable  Result Date: 12/27/2016 CLINICAL DATA:  83 year old male status post left hip replacement. Initial encounter. EXAM: PORTABLE PELVIS 1-2 VIEWS COMPARISON:  Pelvis/ left hip radiographs 12/26/2016. FINDINGS: Portable AP supine view at 1734 hours. Left bipolar hip arthroplasty now in place. Hardware appears intact and normally aligned in the AP view. The pelvis elsewhere appears stable. Incidental penile implant. No unexpected osseous changes identified. Postoperative changes to the soft tissues surrounding the left hip including subcutaneous gas. IMPRESSION: Left bipolar hip arthroplasty with no adverse features. Electronically Signed   By: Genevie Ann M.D.   On: 12/27/2016 17:53   Dg Chest Port 1 View  Result Date: 12/28/2016 CLINICAL DATA:  Hypoxia, CHF, hypertension, diabetes mellitus, COPD EXAM: PORTABLE CHEST 1 VIEW COMPARISON:  Portable exam 1307 hours compared to 12/26/2016 FINDINGS: Rotated to the RIGHT. Normal heart size, mediastinal contours, and pulmonary vascularity. Bibasilar atelectasis. Lungs otherwise clear. No pleural effusion or pneumothorax.  Bones unremarkable. IMPRESSION: Bibasilar atelectasis. Electronically Signed   By: Lavonia Dana M.D.   On: 12/28/2016 13:45   Dg C-arm 61-120 Min-no Report  Result Date: 12/27/2016 Fluoroscopy was utilized by the requesting physician.  No radiographic interpretation.   Dg Hip Operative Unilat With Pelvis Left  Result Date: 12/27/2016 CLINICAL DATA:  Status post left hip replacement. EXAM: DG C-ARM 61-120 MIN-NO REPORT; OPERATIVE LEFT HIP WITH PELVIS FLUOROSCOPY TIME:  38 seconds. COMPARISON:  Radiographs of December 26, 2016. FINDINGS: Two intraoperative fluoroscopic images of the left hip demonstrate the patient be status post left total hip arthroplasty. The femoral and acetabular components are well situated. No dislocation is noted. Expected postoperative changes are seen involving the surrounding soft tissues. IMPRESSION: Status post left total hip arthroplasty. Electronically Signed   By: Marijo Conception, M.D.   On: 12/27/2016 16:56        Scheduled Meds: . sodium chloride   Intravenous Once  . atorvastatin  10 mg Oral q1800  . clobetasol  1 application Topical Daily  . clopidogrel  75 mg Oral Daily  . docusate sodium  100 mg Oral BID  . feeding supplement (ENSURE ENLIVE)  237 mL Oral BID BM  . fluticasone furoate-vilanterol  1 puff Inhalation Daily  . insulin aspart  0-15 Units Subcutaneous TID WC  . insulin aspart  4 Units Subcutaneous TID WC  . insulin glargine  8 Units Subcutaneous Daily  . polyethylene glycol  17 g Oral Daily  . tiotropium  18 mcg Inhalation Daily  . traZODone  25 mg Oral QHS   Continuous Infusions:   LOS: 3 days    Time spent: 35 minutes    Duffy Dantonio A, MD [facility] Pager [phone]  If 7PM-7AM, please contact night-coverage [email or web address] Password [facility] 12/29/2016, 12:13 PM

## 2016-12-29 NOTE — Anesthesia Postprocedure Evaluation (Signed)
Anesthesia Post Note  Patient: [name] Small  Procedure(s) Performed: Procedure(s) (LRB): LEFT TOTAL HIP ARTHROPLASTY ANTERIOR APPROACH (Left)  Patient location during evaluation: PACU Anesthesia Type: General Level of consciousness: sedated Pain management: satisfactory to patient Vital Signs Assessment: post-procedure vital signs reviewed and stable Respiratory status: spontaneous breathing Cardiovascular status: stable Anesthetic complications: no       Last Vitals:  Vitals:   12/28/16 2211 12/29/16 0618  BP: (!) 114/48 (!) 122/54  Pulse: 80 79  Resp: 18 18  Temp: 36.6 C 36.8 C    Last Pain:  Vitals:   12/29/16 0908  TempSrc:   PainSc: 1                  Braiden Presutti EDWARD

## 2016-12-29 NOTE — Evaluation (Signed)
Occupational Therapy Evaluation Patient Details Name: [name] Small MRN: [id number] DOB: [birth year] Today's Date: 12/29/2016    History of Present Illness Left hip fracture x several month duraion. s/P DA THA 12/27/16, H/O L/ trans metatarsal amputation   Clinical Impression   Patient is s/p L DA THA surgery resulting in functional limitations due to the deficits listed below (see OT problem list). PTA pt was sleeping in lift chair and had not left the house since Christmas. Pt remained inside the home at all times per wife. Pt has relied on wife for self feeding due to decr grasp.  Patient will benefit from skilled OT acutely to increase independence and safety with ADLS to allow discharge [place].     Follow Up Recommendations  Home health OT    Equipment Recommendations  None recommended by OT    Recommendations for Other Services       Precautions / Restrictions Precautions Precautions: Fall Restrictions LLE Weight Bearing: Weight bearing as tolerated      Mobility Bed Mobility Overal bed mobility: Needs Assistance Bed Mobility: Supine to Sit   Sidelying to sit: Mod assist Supine to sit: Mod assist     General bed mobility comments: educated on use of sheet as leg lifter and pt is able to get L LE to EOB> pt however unable with HOB 45 degrees to elevate truck from surface. pt needs (A) to sit EOB  Transfers Overall transfer level: Needs assistance Equipment used: Rolling walker (2 wheeled) Transfers: Sit to/from Stand Sit to Stand: Min assist         General transfer comment: pt with elevated surface and able to power up into standing. Pt reports that leg is sore    Balance                                            ADL Overall ADL's : Needs assistance/impaired Eating/Feeding: Set up Eating/Feeding Details (indicate cue type and reason): pt provided red foam this AM to help with self feeding. pt states "oh yeah look this is much  better" Pt and wife educated on buying built up utensils if patient likes the red foam grasp Grooming: Wash/dry hands;Set up;Sitting                   Toilet Transfer: Minimal assistance;Stand-pivot;RW Toilet Transfer Details (indicate cue type and reason): simulated EOB to chair with built up surface height   Toileting - Clothing Manipulation Details (indicate cue type and reason): currently with foley       General ADL Comments: pt demonstrates a slight body tremor with all movement     Vision     Perception     Praxis      Pertinent Vitals/Pain Pain Assessment: No/denies pain     Hand Dominance Left   Extremity/Trunk Assessment Upper Extremity Assessment Upper Extremity Assessment: RUE deficits/detail;LUE deficits/detail RUE Deficits / Details: decr grasp due to 4th and 5th digit LUE Deficits / Details: decr grasp 4th and 5th digit   Lower Extremity Assessment Lower Extremity Assessment: Defer to PT evaluation   Cervical / Trunk Assessment Cervical / Trunk Assessment: Kyphotic   Communication Communication Communication: No difficulties   Cognition Arousal/Alertness: Awake/alert Behavior During Therapy: WFL for tasks assessed/performed Overall Cognitive Status: Within Functional Limits for tasks assessed  General Comments       Exercises       Shoulder Instructions      Home Living Family/patient expects to be discharged to:: Private residence Living Arrangements: Spouse/significant other Available Help at Discharge: Family;Available 24 hours/day Type of Home: House Home Access: Stairs to enter [employer] of Steps: 4 Entrance Stairs-Rails: Left Home Layout: One level     Bathroom Shower/Tub: Tub/shower unit;Walk-in shower   Bathroom Toilet: Handicapped height Bathroom Accessibility: Yes   Home Equipment: Walker - 2 wheels;Bedside commode   Additional Comments: lift chair- sleeps in lift chair.  wife reports lift chair has been the best purchase they ever made      Prior Functioning/Environment Level of Independence: Needs assistance  Gait / Transfers Assistance Needed: has required RW, sleeps in lift chair ADL's / Homemaking Assistance Needed: wife assists with shower/ feeding patient due to OA in hands.    Comments: uses cane and furniture walks, daughters reports several incidence of LEs giving out and patient falls        OT Problem List: Decreased strength;Decreased activity tolerance;Impaired balance (sitting and/or standing);Decreased safety awareness;Decreased knowledge of use of DME or AE;Decreased knowledge of precautions;Pain   OT Treatment/Interventions: Self-care/ADL training;Therapeutic exercise;DME and/or AE instruction;Therapeutic activities;Patient/family education;Balance training    OT Goals(Current goals can be found in the care plan section) Acute Rehab OT Goals Patient Stated Goal: to sit up for breakfast sounds great OT Goal Formulation: With patient Time For Goal Achievement: 01/19/17 Potential to Achieve Goals: Good  OT Frequency: Min 2X/week   Barriers to D/C:            Co-evaluation              End of Session Equipment Utilized During Treatment: Gait belt;Rolling walker Nurse Communication: Mobility status;Precautions  Activity Tolerance: Patient tolerated treatment well Patient left: in chair;with call bell/phone within reach;with chair alarm set;with family/visitor present;Other (comment) (elevated surface because pt uses lift chair at home)   Time: 2035-5974 OT Time Calculation (min): 18 min Charges:  OT General Charges $OT Visit: 1 Procedure OT Evaluation $OT Eval Moderate Complexity: 1 Procedure G-Codes:    [name] 01-25-17, 8:31 AM   Jeri Modena   OTR/L Pager: [phone] Office: [phone] .

## 2016-12-29 NOTE — Progress Notes (Signed)
Inpatient Rehabilitation  Per therapy request patient was screened by Gunnar Fusi for appropriateness for an Inpatient Acute Rehab consult.  Notes reviewed and at this time patient does not appear to be at a level that would allow for participation in 3 hours of therapy a day.  Plan to follow along for therapy tolerance/participation.  Please plan for my co-worker Gerlean Ren to follow up tomorrow.    Carmelia Roller., CCC/SLP Admission Coordinator  [place]  Cell [phone]

## 2016-12-29 NOTE — Progress Notes (Signed)
Subjective: Patient stable.  Was able to stand pivot and transfer to chair today.   Objective: Vital signs in last 24 hours: Temp:  [97.8 F (36.6 C)-98.2 F (36.8 C)] 98.2 F (36.8 C) (02/18 0618) Pulse Rate:  [71-80] 79 (02/18 0618) Resp:  [18-20] 18 (02/18 0618) BP: (114-130)/(48-58) 122/54 (02/18 0618) SpO2:  [88 %-100 %] 95 % (02/18 0852) Weight:  [215 lb 2.7 oz (97.6 kg)-215 lb 13.3 oz (97.9 kg)] 215 lb 13.3 oz (97.9 kg) (02/18 0618)  Intake/Output from previous day: 02/17 0701 - 02/18 0700 In: 2171.3 [P.O.:1440; I.V.:731.3] Out: 1385 [Urine:1385] Intake/Output this shift: No intake/output data recorded.  Exam:  Dorsiflexion/Plantar flexion intact  Labs:  Recent Labs  12/26/16 1443 12/28/16 0438 12/29/16 0424  HGB 13.1 9.4* 7.9*    Recent Labs  12/28/16 0438 12/29/16 0424  WBC 11.9* 9.6  RBC 2.94* 2.46*  HCT 29.1* 23.8*  PLT 210 184    Recent Labs  12/28/16 1148 12/29/16 0424  NA 139 139  K 5.1 4.6  CL 105 105  CO2 24 27  BUN 57* 57*  CREATININE 2.83* 2.71*  GLUCOSE 269* 177*  CALCIUM 7.6* 7.4*    Recent Labs  12/26/16 1443  INR 1.00    Assessment/Plan: Patient doing okay from orthopedic standpoint.  Creatinine has improved today but his hemoglobin is decreasing.  I would favor transfusion 1 unit packed red blood cells today. We appreciate Dr. Carles Collet consulting on this patient  Anderson Malta 12/29/2016, 10:02 AM

## 2016-12-29 NOTE — Progress Notes (Signed)
Patient voided 20 ml of urine. Post void residual showed 682 ml via bladder scan. MD notified

## 2016-12-29 NOTE — Progress Notes (Signed)
Physical Therapy Treatment Patient Details Name: [name] Small MRN: [id number] DOB: 1928-01-24 Today's Date: 12/29/2016    History of Present Illness Left hip fracture x several month duraion. s/P DA THA 12/27/16, H/O L/ trans metatarsal amputation    PT Comments    The patient had much difficulty ambulating today. Required 2 persons to assist him back onto bed as left leg was not advancing. . Depending on progress and level of assist,may benefit from post acute rehab. Steps may be a challenge as they were PTA per wife, requiring 2 persons to negotiate steps. Continue PT. Getting 1 unit of blood today.  Follow Up Recommendations  CIR;Home health PT;SNF (need to watch progress)     Equipment Recommendations  None recommended by PT    Recommendations for Other Services Rehab consult     Precautions / Restrictions Precautions Precautions: Fall Precaution Comments: needs orthotic shoes for transmet amp on lt Restrictions LLE Weight Bearing: Weight bearing as tolerated    Mobility  Bed Mobility Overal bed mobility: Needs Assistance Bed Mobility: Sit to Supine       Sit to supine: Max assist;+2 for safety/equipment   General bed mobility comments: 2 max assist to get legs onto bed and trunk  down to supine due to the patient was sitting on the very edge of the bed.  Transfers Overall transfer level: Needs assistance Equipment used: Rolling walker (2 wheeled) Transfers: Sit to/from Stand Sit to Stand: Mod assist;+2 physical assistance;+2 safety/equipment;From elevated surface         General transfer comment: 2 attempts to stand from the  recliner with 2 persons to power up to stand and steady balance, tendency to lean posteriorly. the patient required 2 persons max assist to  turn and sit down on the bed after it was brought up closer due to pt. not being able to fully turn and back up to the bed secondary to the left leg was behind him and knee flexed.    Ambulation/Gait Ambulation/Gait assistance: Max assist;+2 physical assistance;+2 safety/equipment Ambulation Distance (Feet): 20 Feet Assistive device: Rolling walker (2 wheeled) Gait Pattern/deviations: Step-to pattern;Leaning posteriorly;Staggering left;Staggering right;Decreased weight shift to left;Shuffle   Gait velocity interpretation: <1.8 ft/sec, indicative of risk for recurrent falls General Gait Details: the first 10 feet of ambulation required only 1 assist with 1 for standby. At that point, the patient turned around to amb. back to bed. The patient had difficulty advancing the left leg, Multimodal cues to roll the RW  forward.  The back right post of RW kept  catching the rt foot  sock. Gradually, the patient was not advancing the left leg. Much effort was required by 2 persons to turn the pt enough to sit down on the bed as the bed was brought up by a third person. The patient did not have orthotic shoes available   Stairs            Wheelchair Mobility    Modified Rankin (Stroke Patients Only)       Balance Overall balance assessment: History of Falls;Needs assistance Sitting-balance support: Feet supported;Bilateral upper extremity supported Sitting balance-Leahy Scale: Fair     Standing balance support: During functional activity;Bilateral upper extremity supported Standing balance-Leahy Scale: Poor                      Cognition Arousal/Alertness: Awake/alert Behavior During Therapy: WFL for tasks assessed/performed Overall Cognitive Status: Within Functional Limits for tasks assessed  Exercises      General Comments        Pertinent Vitals/Pain Pain Assessment: No/denies pain Pain Score: 1  Pain Descriptors / Indicators: Sore Pain Intervention(s): Monitored during session;Repositioned;Premedicated before session    Home Living                      Prior Function            PT Goals (current  goals can now be found in the care plan section) Progress towards PT goals: Progressing toward goals    Frequency    7X/week      PT Plan Current plan remains appropriate (pt may need to consider SNF rehab  due to complexity of condition, decreased balance )    Co-evaluation             End of Session Equipment Utilized During Treatment: Gait belt Activity Tolerance: Patient limited by fatigue Patient left: in bed;with call bell/phone within reach;with family/visitor present;with nursing/sitter in room     Time: 1030-1050 PT Time Calculation (min) (ACUTE ONLY): 20 min  Charges:  $Gait Training: 8-22 mins                    G Codes:      Claretha Cooper 12/29/2016, 1:28 PM Tresa Endo PT [phone]

## 2016-12-30 ENCOUNTER — Encounter (HOSPITAL_COMMUNITY): Payer: Self-pay | Admitting: Orthopaedic Surgery

## 2016-12-30 DIAGNOSIS — Z794 Long term (current) use of insulin: Secondary | ICD-10-CM

## 2016-12-30 DIAGNOSIS — E1142 Type 2 diabetes mellitus with diabetic polyneuropathy: Secondary | ICD-10-CM

## 2016-12-30 DIAGNOSIS — Z9889 Other specified postprocedural states: Secondary | ICD-10-CM

## 2016-12-30 LAB — URINE CULTURE: Culture: >=100000 — AB

## 2016-12-30 LAB — BASIC METABOLIC PANEL
Anion gap: 6 (ref 5–15)
BUN: 49 mg/dL — ABNORMAL HIGH (ref 6–20)
CO2: 29 mmol/L (ref 22–32)
Calcium: 7.7 mg/dL — ABNORMAL LOW (ref 8.9–10.3)
Chloride: 102 mmol/L (ref 101–111)
Creatinine, Ser: 2.02 mg/dL — ABNORMAL HIGH (ref 0.61–1.24)
GFR calc Af Amer: 33 mL/min — ABNORMAL LOW (ref >60)
GFR calc non Af Amer: 28 mL/min — ABNORMAL LOW (ref >60)
Glucose, Bld: 198 mg/dL — ABNORMAL HIGH (ref 65–99)
Potassium: 4 mmol/L (ref 3.5–5.1)
Sodium: 137 mmol/L (ref 135–145)

## 2016-12-30 LAB — TYPE AND SCREEN
Blood Product Expiration Date: 201803122359
ISSUE DATE / TIME: 201802181331
Unit Type and Rh: 6200

## 2016-12-30 LAB — GLUCOSE, CAPILLARY
Glucose-Capillary: 274 mg/dL — ABNORMAL HIGH (ref 65–99)
Glucose-Capillary: 161 mg/dL — ABNORMAL HIGH (ref 65–99)
Glucose-Capillary: 236 mg/dL — ABNORMAL HIGH (ref 65–99)
Glucose-Capillary: 196 mg/dL — ABNORMAL HIGH (ref 65–99)

## 2016-12-30 LAB — CBC
HCT: 25.6 % — ABNORMAL LOW (ref 39.0–52.0)
Hemoglobin: 8.7 g/dL — ABNORMAL LOW (ref 13.0–17.0)
MCH: 32.2 pg (ref 26.0–34.0)
MCHC: 34 g/dL (ref 30.0–36.0)
MCV: 94.8 fL (ref 78.0–100.0)
Platelets: 204 10*3/uL (ref 150–400)
RBC: 2.7 MIL/uL — ABNORMAL LOW (ref 4.22–5.81)
RDW: 15 % (ref 11.5–15.5)
WBC: 10.2 10*3/uL (ref 4.0–10.5)

## 2016-12-30 MED ORDER — AMOXICILLIN 500 MG PO CAPS
500.0000 mg | ORAL_CAPSULE | Freq: Three times a day (TID) | ORAL | Status: DC
  Administered 2016-12-30 – 2016-12-31 (×5): 500 mg via ORAL
  Filled 2016-12-30 (×5): qty 1

## 2016-12-30 NOTE — Op Note (Signed)
NAMEDECLYN, DELSOL NO.:  [id number]  MEDICAL RECORD NO.:  [id number]  LOCATION:                                 FACILITY:  PHYSICIAN:  Lind Guest. Ninfa Linden, M.D.DATE OF BIRTH:  [birth year]  DATE OF PROCEDURE:  12/27/2016 DATE OF DISCHARGE:                              OPERATIVE REPORT   PREOPERATIVE DIAGNOSIS:  Left displaced subacute femoral neck fracture.  POSTOPERATIVE DIAGNOSIS:  Left displaced subacute femoral neck fracture.  PROCEDURE:  Left total hip arthroplasty through direct anterior approach.  IMPLANTS:  DePuy Sector Gription acetabular component size 58; size 36+, 4 polyethylene liner; size 13 Corail femoral component with standard offset; size 36+, 5 ceramic hip ball.  SURGEON:  Lind Guest. Ninfa Linden, M.D.  ASSISTANT:  Erskine Emery, PA-C.  ANESTHESIA:  General.  ANTIBIOTICS:  2 g of IV Ancef.  BLOOD LOSS:  500 mL.  COMPLICATIONS:  None.  INDICATIONS:  Mr. Torosyan is an 83 year old gentleman who has been complaining of left hip weakness, but not severe pain.  Apparently, he had a mechanical fall back in August and has not had any x-rays of his hip until yesterday.  He was not complaining though of hip pain and not even knee pain.  He has had a history of a transmetatarsal amputation on his left foot and is neuropathic.  He has only recently felt that he was having more and more difficulty ambulating and the inability to lift his leg.  X-rays were obtained in our office yesterday and showed a displaced subacute femoral neck fracture and significant shortening of his left lower extremity.  He was admitted to the hospital and it was recommended he undergo a total hip arthroplasty through a direct anterior approach.  I felt that I had to perform a total hip instead of hemiarthroplasty because he has been shortened for so long.  I felt this would be easier to reduce.  I talked to him and his family in length about this.  We were using  general anesthesia as well because he has been on Plavix and they do not want [place] to continue wait any further before proceeding with surgery.  PROCEDURE DESCRIPTION:  After informed consent was obtained, appropriate left hip was marked.  He was brought to the operating room.  General anesthesia was obtained while he was on the stretcher.  Traction boots were placed on his feet.  Next, he was placed supine on the Hana fracture table with the perineal post in place and both legs in inline skeletal traction devices, but no traction applied.  His left operative hip was then prepped and draped with DuraPrep and sterile drapes.  Time- out was called and he was identified as correct patient and correct left hip.  We then made an incision just inferior and posterior to the anterior superior iliac spine and carried this obliquely down the leg. We dissected down the tensor fascia lata muscle.  The tensor fascia was then divided longitudinally to proceed with a direct anterior approach to the hip.  We identified and cauterized the circumflex vessels and then identified the hip capsule.  I opened the hip capsule finding a joint effusion and obvious  subacute and fracture nonunion of femoral neck fracture.  We removed fracture callus and noted debris which was mainly fibrous tissue.  We made a femoral neck cut just distal to the fracture, but proximal to the lesser trochanter with an oscillating saw and completed this with an osteotome.  We removed the femoral head in its entirety and found to be a large femoral head.  We placed a bent Hohmann over the medial acetabular rim and then cleaned remnants of acetabular labrum and other debris from the hip socket.  We then began reaming under direct visualization from a size 42 reamer all the way up to a size 58 in stepwise increments.  Two reamers were placed under direct fluoroscopy, so we could obtain our depth of reaming, our inclination, and  anteversion.  Once we were pleased with this, we placed the real DePuy sector cup size 7 size 58 acetabular component size 58 and the real 36+, 4 polyethylene liner for that size acetabular component.  Attention was then turned to the femur.  With the leg externally rotated to 120 degrees, extended and adducted, we were able to place a Mueller retractor medially and a Hohmann retractor behind the greater trochanter.  We released the lateral joint capsule and used a box cutting osteotome in the inner femoral canal.  We then removed the synovium, callus, and scar tissue.  I was able to get down into the hip in the femoral canal.  We then began broaching from a size 8 broach using the Corail broaching system up to a size 13.  With a size 13 in place, we trialed a standard offset femoral neck and a 36+, 1.5 hip ball.  We brought the leg back over and up with traction and rotation reducing the pelvis and he was stable, but we felt that he needed just a little bit more offset and leg length.  We dislocated the hip and removed the trial components.  We were placed the real trial size 13 Corail femoral component with standard offset.  The real 36+, 5 hip ball reduced this in acetabulum.  It was stable.  We then copiously irrigated the soft tissue with normal saline solution using pulsatile lavage.  We were able to close the hip capsule with interrupted #1 Ethibond suture, followed by 0 Vicryl in deep tissue, a #1 running suture in the tensor fascia, and 4-0 Monocryl subcuticular stitch.  Steri-Strips and well- padded Aquacel dressing was applied.  He was taken off the Hana table, awakened, extubated and taken to the recovery room in stable condition. All final counts were correct.  There were no complications noted.  Of note, Erskine Emery, PA-C assisted in the entire case.  His assistance was crucial for facilitating all aspects of this case.     Lind Guest. Ninfa Linden,  M.D.   ______________________________ Lind Guest. Ninfa Linden, M.D.    CYB/MEDQ  D:  12/27/2016  T:  12/28/2016  Job:  [id number]

## 2016-12-30 NOTE — Progress Notes (Signed)
Physical Therapy Treatment Patient Details Name: [name] Small MRN: [id number] DOB: 1927-12-25 Today's Date: 12/30/2016    History of Present Illness Left hip fracture x several month duraion. s/P DA THA 12/27/16, H/O L/ trans metatarsal amputation    PT Comments    POD # 4 Assisted OOB to amb a greater distance + 2 assist.  Performed some TE's then applied ICE  Follow Up Recommendations  SNF ([facility])     Equipment Recommendations       Recommendations for Other Services       Precautions / Restrictions Precautions Precautions: Fall Precaution Comments: needs orthotic shoes for transmet amp on L (spouse states they are at home) but did bring in sneakers Restrictions Weight Bearing Restrictions: No LLE Weight Bearing: Weight bearing as tolerated    Mobility  Bed Mobility Overal bed mobility: Needs Assistance Bed Mobility: Supine to Sit     Supine to sit: Mod assist;Max assist     General bed mobility comments: utilizing bed pad to complete scooting to EOB and assist with L LE off bed  Transfers Overall transfer level: Needs assistance Equipment used: Rolling walker (2 wheeled) Transfers: Sit to/from Stand Sit to Stand: Mod assist;+2 physical assistance;+2 safety/equipment;From elevated surface         General transfer comment: 50% VC's on proper hand placement and initial posterior lean.  Also, uncontrolled decend to recliner.  Pt admits to using a lift chair at home.    Ambulation/Gait Ambulation/Gait assistance: Min assist;Mod assist;+2 safety/equipment Ambulation Distance (Feet): 50 Feet (25 feet x 2 one sitting rest break) Assistive device: Rolling walker (2 wheeled) Gait Pattern/deviations: Step-to pattern;Decreased stance time - left     General Gait Details: very unsteady gait requiring 50% VC's to decrease speed and proper walker to self distance.  + 2 assist for safety and recliner following.     Stairs            Wheelchair  Mobility    Modified Rankin (Stroke Patients Only)       Balance                                    Cognition Arousal/Alertness: Awake/alert Behavior During Therapy: WFL for tasks assessed/performed Overall Cognitive Status: Within Functional Limits for tasks assessed                      Exercises   Total Hip Replacement TE's 10 reps ankle pumps 10 reps knee presses 10 reps heel slides AAROM 10 reps SAQ's AAROM 10 reps ABD AAROM Followed by ICE    General Comments        Pertinent Vitals/Pain Pain Assessment: 0-10 Pain Score: 3  Pain Location: L hip "not bad" Pain Descriptors / Indicators: Sore Pain Intervention(s): Monitored during session;Repositioned;Ice applied    Home Living                      Prior Function            PT Goals (current goals can now be found in the care plan section) Progress towards PT goals: Progressing toward goals    Frequency    7X/week      PT Plan Current plan remains appropriate    Co-evaluation             End of Session Equipment Utilized During Treatment: Gait belt Activity  Tolerance: Patient tolerated treatment well Patient left: in bed;with bed alarm set;with call bell/phone within reach;with family/visitor present Nurse Communication: Mobility status       Time: 6759-1638 PT Time Calculation (min) (ACUTE ONLY): 29 min  Charges:  $Gait Training: 8-22 mins $Therapeutic Activity: 8-22 mins                    G Codes:       Rica Koyanagi  PTA [facility]  Acute  Rehab Pager      [phone]

## 2016-12-30 NOTE — Progress Notes (Signed)
Subjective: 3 Days Post-Op Procedure(s) (LRB): LEFT TOTAL HIP ARTHROPLASTY ANTERIOR APPROACH (Left) Patient reports pain as moderate.  Slow mobility.  Creatinine and H/H improved with transfusion.  Objective: Vital signs in last 24 hours: Temp:  [97.9 F (36.6 C)-99 F (37.2 C)] 98.2 F (36.8 C) (02/19 0535) Pulse Rate:  [70-87] 70 (02/19 0535) Resp:  [16-18] 18 (02/19 0535) BP: (109-142)/(45-58) 109/45 (02/19 0535) SpO2:  [88 %-96 %] 95 % (02/19 0535) Weight:  [211 lb 3.2 oz (95.8 kg)] 211 lb 3.2 oz (95.8 kg) (02/19 0535)  Intake/Output from previous day: 02/18 0701 - 02/19 0700 In: 1600 [P.O.:1200; I.V.:10; Blood:390] Out: 2150 [Urine:2150] Intake/Output this shift: No intake/output data recorded.   Recent Labs  12/28/16 0438 12/29/16 0424 12/30/16 0410  HGB 9.4* 7.9* 8.7*    Recent Labs  12/29/16 0424 12/30/16 0410  WBC 9.6 10.2  RBC 2.46* 2.70*  HCT 23.8* 25.6*  PLT 184 204    Recent Labs  12/29/16 0424 12/30/16 0410  NA 139 137  K 4.6 4.0  CL 105 102  CO2 27 29  BUN 57* 49*  CREATININE 2.71* 2.02*  GLUCOSE 177* 198*  CALCIUM 7.4* 7.7*   No results for input(s): LABPT, INR in the last 72 hours.  Intact pulses distally Dorsiflexion/Plantar flexion intact Incision: scant drainage  Assessment/Plan: 3 Days Post-Op Procedure(s) (LRB): LEFT TOTAL HIP ARTHROPLASTY ANTERIOR APPROACH (Left) Up with therapy  Social Work consult for skilled nursing placement  Mcarthur Rossetti 12/30/2016, 7:29 AM

## 2016-12-30 NOTE — Clinical Social Work Note (Signed)
Clinical Social Work Assessment  Patient Details  Name: [name] Small MRN: [id number] Date of Birth: 1928/04/01  Date of referral:  12/30/16               Reason for consult:  Discharge Planning                Permission sought to share information with:  Facility Art therapist granted to share information::  Yes, Verbal Permission Granted  Name::        Agency::     Relationship::     Contact Information:     Housing/Transportation Living arrangements for the past 2 months:  [place] of Information:  Spouse Patient Interpreter Needed:  None Criminal Activity/Legal Involvement Pertinent to Current Situation/Hospitalization:  No - Comment as needed Significant Relationships:  Spouse, Adult Children Lives with:  Spouse Do you feel safe going back to the place where you live?   (SNF recommended.) Need for family participation in patient care:  Yes (Comment)  Care giving concerns:  Pt's care cannot be managed at home following hospital d/c.    Social Worker assessment / plan:  Pt hospitalized on 12/26/16 from home with spouse, with a left subacute femoral neck fx. Surgery has been completed and PT has recommended SNF at d/c. CSW met with spouse at bedside to review PT recommendations. Pt was sleeping soundly during visit. Spouse is in agreement with plan for ST Rehab at d/c. SNF search initiated and bed offers provided. Spouse has chosen [facility] for placement. SNF will have bed available tomorrow if pt is stable for d/c. Spouse has been updated. CSW will continue to follow to assist with d/c planning needs.  Employment status:  Retired Nurse, adult PT Recommendations:  [place] / Referral to community resources:  [place]  Patient/Family's Response to care: Spouse feels ST Rehab is needed.  Patient/Family's Understanding of and Emotional Response to Diagnosis,  Current Treatment, and Prognosis:  Spouse is relieved that hip fx was identified and treated. " My husband has been dealing with this pain since he fell in August." Support provided. CSW referred pt /spouse to unit Director for additional assistance. Spouse appreciates CSW assistance with d/c planning.  Emotional Assessment Appearance:  Appears stated age Attitude/Demeanor/Rapport:  Unable to Assess Affect (typically observed):  Unable to Assess Orientation:  Oriented to Self, Oriented to Place, Oriented to  Time, Oriented to Situation Alcohol / Substance use:  Not Applicable Psych involvement (Current and /or in the community):  No (Comment)  Discharge Needs  Concerns to be addressed:  Discharge Planning Concerns Readmission within the last 30 days:  No Current discharge risk:  None Barriers to Discharge:  No Barriers Identified   Luretha Rued, [place] 12/30/2016, 1:25 PM

## 2016-12-30 NOTE — Progress Notes (Signed)
PROGRESS NOTE  [name] Small  TKZ:[id number] DOB: 1928/04/07 DOA: 12/26/2016 PCP: [name], MD Outpatient Specialists:  Discharge recommendation:  Amoxicillin 500 mg 3 times a day for 7 more days.  Keep Foley catheter in place on discharge.  Follow-up with [facility] (Dr. Louis Meckel) as soon as possible for BOO and bladder cancer  Subjective: This is seen with his wife and daughter at bedside. Denies any complaints this morning. Discontinue Foley catheter and voiding trial if not able to void we'll place the catheter back again.  Assessment & Plan:   Principal Problem:   Closed left hip fracture, initial encounter ([place]) Active Problems:   Type 2 diabetes mellitus with diabetic polyneuropathy (HCC)   Hyperkalemia   Chronic diastolic CHF (congestive heart failure) (HCC)   HTN (hypertension)   COPD (chronic obstructive pulmonary disease) (HCC)   Acute renal failure superimposed on stage 3 chronic kidney disease (HCC)   Status post lumbar laminectomy   S/p left hip fracture   Malnutrition of moderate degree   Hypoxia   Acute on chronic renal failure--CKD stage III -Secondary to hemodynamic changes, particularly low blood pressure -Anticipate some worsening before improvement -Hoping for serum creatinine to reach a plateau in next 24 to 48 hours -Creatinine is 2.7 (baseline is around 2).  Bladder cancer -Patient has known history of bladder cancer, renal ultrasound showed bladder lesion. -Recommended to follow-up with urology ASAP  UTI -Urinalysis showed TNTC WBCs, urine culture grew enterococcus faecalis susceptible to ampicillin. -Treat with amoxicillin for 7 more days.  Hyperkalemia -This is resolved with Kayexalate, potassium went up to 5.9 currently 4.6.  Relative hypotension -Likely secondary to opioids -This is resolved with IV fluid hydration.  Diabetes mellitus type 2, insulin-dependent -12/26/2016 hemoglobin A1c 6.8 -Start reduced dose  Lantus -Continue NovoLog sliding scale -Holding glipizide  COPD -Appears stable without wheezing -Order chest x-ray--suspect atelectasis -Flutter valve -Continue Breo/Spiriva  Hyperlipidemia -Continue statin  CAD -no chest pain -continue plavix -EKG reassuring  Chronic diastolic CHF -Appears clinically compensated -Daily weights -Holding furosemide temporarily secondary to acute on chronic renal failure  Anemia -Acute postoperative blood loss anemia, transfuse 1 unit of packed RBCs.  DVT prophylaxis: Subcutaneous heparin Code Status: Full Code Family Communication:  Disposition Plan:  Diet: Diet Carb Modified Fluid consistency: Thin; Room service appropriate? Yes  Consultants:   TRH  Procedures:   Left THA  Antimicrobials:   Perioperative cefazolin  Objective: Vitals:   12/29/16 1745 12/29/16 2105 12/30/16 0535 12/30/16 0932  BP: (!) 118/49 (!) 142/55 (!) 109/45   Pulse: 82 82 70   Resp:  18 18   Temp: 98.1 F (36.7 C) 99 F (37.2 C) 98.2 F (36.8 C)   TempSrc: Oral Oral Oral   SpO2: 94% 96% 95% 91%  Weight:   95.8 kg (211 lb 3.2 oz)   Height:        Intake/Output Summary (Last 24 hours) at 12/30/16 1238 Last data filed at 12/30/16 1036  Gross per 24 hour  Intake             1600 ml  Output             2300 ml  Net             -700 ml   Filed Weights   12/28/16 1400 12/29/16 0618 12/30/16 0535  Weight: 97.6 kg (215 lb 2.7 oz) 97.9 kg (215 lb 13.3 oz) 95.8 kg (211 lb 3.2 oz)    Examination: General exam:  Appears calm and comfortable  Respiratory system: Clear to auscultation. Respiratory effort normal. Cardiovascular system: S1 & S2 heard, RRR. No JVD, murmurs, rubs, gallops or clicks. No pedal edema. Gastrointestinal system: Abdomen is nondistended, soft and nontender. No organomegaly or masses felt. Normal bowel sounds heard. Central nervous system: Alert and oriented. No focal neurological deficits. Extremities: Symmetric 5 x 5  power. Skin: No rashes, lesions or ulcers Psychiatry: Judgement and insight appear normal. Mood & affect appropriate.   Data Reviewed: I have personally reviewed following labs and imaging studies  CBC:  Recent Labs Lab 12/26/16 1443 12/28/16 0438 12/29/16 0424 12/30/16 0410  WBC 10.1 11.9* 9.6 10.2  HGB 13.1 9.4* 7.9* 8.7*  HCT 39.8 29.1* 23.8* 25.6*  MCV 96.4 99.0 96.7 94.8  PLT 237 210 184 211   Basic Metabolic Panel:  Recent Labs Lab 12/26/16 1443 12/28/16 0438 12/28/16 1148 12/29/16 0424 12/30/16 0410  NA 140 142 139 139 137  K 4.2 5.9* 5.1 4.6 4.0  CL 103 106 105 105 102  CO2 28 24 24 27 29   GLUCOSE 138* 253* 269* 177* 198*  BUN 51* 53* 57* 57* 49*  CREATININE 1.94* 2.57* 2.83* 2.71* 2.02*  CALCIUM 8.8* 7.9* 7.6* 7.4* 7.7*   GFR: Estimated Creatinine Clearance: 30.5 mL/min (by C-G formula based on SCr of 2.02 mg/dL (H)). Liver Function Tests:  Recent Labs Lab 12/26/16 1443  AST 15  ALT 11*  ALKPHOS 52  BILITOT 0.4  PROT 6.8  ALBUMIN 3.9   No results for input(s): LIPASE, AMYLASE in the last 168 hours. No results for input(s): AMMONIA in the last 168 hours. Coagulation Profile:  Recent Labs Lab 12/26/16 1443  INR 1.00   Cardiac Enzymes:  Recent Labs Lab 12/29/16 0424  CKTOTAL 173   BNP (last 3 results) No results for input(s): PROBNP in the last 8760 hours. HbA1C: No results for input(s): HGBA1C in the last 72 hours. CBG:  Recent Labs Lab 12/29/16 1204 12/29/16 1653 12/29/16 2105 12/30/16 0752 12/30/16 1214  GLUCAP 156* 221* 119* 274* 161*   Lipid Profile: No results for input(s): CHOL, HDL, LDLCALC, TRIG, CHOLHDL, LDLDIRECT in the last 72 hours. Thyroid Function Tests: No results for input(s): TSH, T4TOTAL, FREET4, T3FREE, THYROIDAB in the last 72 hours. Anemia Panel: No results for input(s): VITAMINB12, FOLATE, FERRITIN, TIBC, IRON, RETICCTPCT in the last 72 hours. Urine analysis:    Component Value Date/Time    COLORURINE YELLOW 12/28/2016 1520   APPEARANCEUR TURBID (A) 12/28/2016 1520   LABSPEC 1.017 12/28/2016 1520   PHURINE 5.0 12/28/2016 1520   GLUCOSEU NEGATIVE 12/28/2016 1520   HGBUR MODERATE (A) 12/28/2016 1520   HGBUR trace-intact 11/25/2007 0937   BILIRUBINUR NEGATIVE 12/28/2016 1520   KETONESUR 5 (A) 12/28/2016 1520   PROTEINUR 30 (A) 12/28/2016 1520   UROBILINOGEN 0.2 01/18/2014 0719   NITRITE NEGATIVE 12/28/2016 1520   LEUKOCYTESUR LARGE (A) 12/28/2016 1520   Sepsis Labs: @LABRCNTIP (procalcitonin:4,lacticidven:4)  ) Recent Results (from the past 240 hour(s))  Surgical PCR screen     Status: None   Collection Time: 12/26/16  5:19 PM  Result Value Ref Range Status   MRSA, PCR NEGATIVE NEGATIVE Final   Staphylococcus aureus NEGATIVE NEGATIVE Final    Comment:        The Xpert SA Assay (FDA approved for NASAL specimens in patients over 55 years of age), is one component of a comprehensive surveillance program.  Test performance has been validated by [facility] for patients greater than or equal  to 45 year old. It is not intended to diagnose infection nor to guide or monitor treatment.   Urine culture     Status: Abnormal   Collection Time: 12/28/16  3:20 PM  Result Value Ref Range Status   Specimen Description URINE, CATHETERIZED  Final   Special Requests NONE  Final   Culture >=100,000 COLONIES/mL ENTEROCOCCUS FAECALIS (A)  Final   Report Status 12/30/2016 FINAL  Final   Organism ID, Bacteria ENTEROCOCCUS FAECALIS (A)  Final      Susceptibility   Enterococcus faecalis - MIC*    AMPICILLIN <=2 SENSITIVE Sensitive     LEVOFLOXACIN 1 SENSITIVE Sensitive     NITROFURANTOIN <=16 SENSITIVE Sensitive     VANCOMYCIN 1 SENSITIVE Sensitive     * >=100,000 COLONIES/mL ENTEROCOCCUS FAECALIS     Invalid input(s): PROCALCITONIN, LACTICACIDVEN   Radiology Studies: US Renal  Result Date: 12/28/2016 CLINICAL DATA:  Patient with elevated creatinine. EXAM: RENAL / URINARY  TRACT ULTRASOUND COMPLETE COMPARISON:  CT abdomen pelvis 09/04/2011 FINDINGS: Right Kidney: Length: 12.5 cm. Renal cortical thinning. No hydronephrosis. Mild increased renal cortical echogenicity. Left Kidney: Length: 13.1 cm. Renal cortical thinning. No hydronephrosis. Mild increased renal cortical echogenicity. Bladder: Masslike echogenic material within the urinary bladder. IMPRESSION: Masslike echogenic material within the posterior aspect of the urinary bladder. While this may represent intraluminal debris, bladder mass cannot be excluded. Consider correlation with urinalysis and direct visualization. No hydronephrosis. Renal cortical thinning. Electronically Signed   By: Lovey Newcomer M.D.   On: 12/28/2016 15:25   Dg Chest Port 1 View  Result Date: 12/28/2016 CLINICAL DATA:  Hypoxia, CHF, hypertension, diabetes mellitus, COPD EXAM: PORTABLE CHEST 1 VIEW COMPARISON:  Portable exam 1307 hours compared to 12/26/2016 FINDINGS: Rotated to the RIGHT. Normal heart size, mediastinal contours, and pulmonary vascularity. Bibasilar atelectasis. Lungs otherwise clear. No pleural effusion or pneumothorax. Bones unremarkable. IMPRESSION: Bibasilar atelectasis. Electronically Signed   By: Lavonia Dana M.D.   On: 12/28/2016 13:45        Scheduled Meds: . amoxicillin  500 mg Oral Q8H  . atorvastatin  10 mg Oral q1800  . clobetasol  1 application Topical Daily  . clopidogrel  75 mg Oral Daily  . docusate sodium  100 mg Oral BID  . feeding supplement (ENSURE ENLIVE)  237 mL Oral BID BM  . fluticasone furoate-vilanterol  1 puff Inhalation Daily  . insulin aspart  0-15 Units Subcutaneous TID WC  . insulin aspart  4 Units Subcutaneous TID WC  . insulin glargine  8 Units Subcutaneous Daily  . polyethylene glycol  17 g Oral Daily  . tiotropium  18 mcg Inhalation Daily  . traZODone  25 mg Oral QHS   Continuous Infusions:   LOS: 4 days    Time spent: 35 minutes    Yvonnie Schinke A, MD [facility] Pager [phone]  If 7PM-7AM, please contact night-coverage [email or web address] Password [facility] 12/30/2016, 12:38 PM

## 2016-12-30 NOTE — NC FL2 (Signed)
[place] MEDICAID FL2 LEVEL OF CARE SCREENING TOOL     IDENTIFICATION  Patient Name: [name] Small Birthdate: 1928/08/02 Sex: male Admission Date (Current Location): 12/26/2016  [facility] and [place] Number:  Herbalist and Address:  [facility],  [address] [place], [place]      Provider Number: [id number]  Attending Physician Name and Address:  Jessy Oto, MD  Relative Name and Phone Number:       Current Level of Care: Hospital Recommended Level of Care: [place] Prior Approval Number:    Date Approved/Denied:   PASRR Number:   [id number]   Discharge Plan: SNF    Current Diagnoses: Patient Active Problem List   Diagnosis Date Noted  . Malnutrition of moderate degree 12/28/2016  . Hypoxia   . Closed left hip fracture, initial encounter ([place]) 12/26/2016    Class: Chronic  . Status post lumbar laminectomy 12/26/2016    Class: Chronic  . S/p left hip fracture 12/26/2016  . Idiopathic chronic venous hypertension of left lower extremity with inflammation 11/07/2016  . Subacute left lumbar radiculopathy 10/18/2016    Class: Chronic  . Spinal stenosis of lumbar region with neurogenic claudication 10/18/2016  . Skin ulcer ([place]) 09/25/2016  . Spinal stenosis of lumbar region 09/25/2016  . Fall 07/04/2016  . Osteomyelitis of left foot ([place])   . Acute renal failure superimposed on stage 3 chronic kidney disease ([place])   . Osteomyelitis ([place]) 06/10/2016  . COPD (chronic obstructive pulmonary disease) ([place]) 06/10/2016  . Right second toe ulcer ([place]) 05/20/2016  . Diabetic ulcer of left foot ([place]) 03/24/2016  . Diabetic ulcer of left foot associated with diabetes mellitus due to underlying condition ([place])   . Weakness 03/18/2016  . Mood disorder ([place]) 03/18/2016  . Groin hematoma   . Chronic renal failure   . Critical lower limb ischemia 02/28/2016  . Peripheral arterial disease ([place]) 10/17/2015  . Edema 07/19/2015   . Claudication ([place]) 01/31/2015  . DOE (dyspnea on exertion) 01/31/2015  . Hyperlipidemia 01/31/2015  . HTN (hypertension) 01/31/2015  . Chronic diastolic CHF (congestive heart failure) ([place]) 01/21/2014  . Seborrheic keratoses 04/30/2013  . CHF (congestive heart failure) ([place]) 12/16/2012  . Atrial fibrillation ([place]) 12/16/2012  . Hyperkalemia 11/27/2011  . Bladder cancer ([place]) 09/22/2011  . Leg cramps 08/28/2011  . Cough 05/26/2011  . CAD (coronary artery disease) of artery bypass graft 05/09/2011  . Dyspnea on exertion 04/19/2011  . PSORIASIS, SCALP 10/25/2008  . Chronic kidney disease, stage III (moderate) 12/04/2007  . Type 2 diabetes mellitus with diabetic polyneuropathy ([place]) 05/21/2007  . DIVERTICULOSIS, COLON 05/21/2007  . BPH (benign prostatic hyperplasia) 05/21/2007    Orientation RESPIRATION BLADDER Height & Weight     Self, Time, Situation, Place  O2 (1 liter) Continent Weight: 211 lb 3.2 oz (95.8 kg) Height:  6' 2.02" (188 cm)  BEHAVIORAL SYMPTOMS/MOOD NEUROLOGICAL BOWEL NUTRITION STATUS      Continent Diet (Carb modified )  AMBULATORY STATUS COMMUNICATION OF NEEDS Skin   Limited Assist Verbally Other (Comment) (Incision- Hip, 2 incisions- left sacrum)                       Personal Care Assistance Level of Assistance  Bathing, Dressing, Feeding Bathing Assistance: Limited assistance Feeding assistance: Independent Dressing Assistance: Limited assistance     Functional Limitations Info             SPECIAL CARE FACTORS FREQUENCY  PT (By licensed PT), OT (By licensed OT)     PT Frequency: 5 OT Frequency: 5            Contractures      Additional Factors Info  Code Status, Allergies Code Status Info: Full code  Allergies Info: ACTOS PIOGLITAZONE HYDROCHLORIDE, ASPIRIN, ACE INHIBITORS, BACTRIM, GABAPENTIN, LYRICA PREGABALIN, PRAVASTATIN, SULFA DRUGS CROSS REACTORS            Current Medications (12/30/2016):  This is the current  hospital active medication list Current Facility-Administered Medications  Medication Dose Route Frequency Provider Last Rate Last Dose  . acetaminophen (TYLENOL) tablet 650 mg  650 mg Oral Q6H PRN Mcarthur Rossetti, MD       Or  . acetaminophen (TYLENOL) suppository 650 mg  650 mg Rectal Q6H PRN Mcarthur Rossetti, MD      . acetaminophen (TYLENOL) tablet 1,000 mg  1,000 mg Oral Q8H PRN Jessy Oto, MD      . albuterol (PROVENTIL) (2.5 MG/3ML) 0.083% nebulizer solution 2.5 mg  2.5 mg Nebulization Q6H PRN Jessy Oto, MD      . alum & mag hydroxide-simeth (MAALOX/MYLANTA) 200-200-20 MG/5ML suspension 30 mL  30 mL Oral Q4H PRN Mcarthur Rossetti, MD      . amoxicillin (AMOXIL) capsule 500 mg  500 mg Oral Q8H Mutaz Elmahi, MD      . atorvastatin (LIPITOR) tablet 10 mg  10 mg Oral q1800 Jessy Oto, MD   10 mg at 12/29/16 1759  . clobetasol (TEMOVATE) 0.05 % external solution 1 application  1 application Topical Daily Jessy Oto, MD      . clopidogrel (PLAVIX) tablet 75 mg  75 mg Oral Daily Mcarthur Rossetti, MD   75 mg at 12/29/16 1008  . docusate sodium (COLACE) capsule 100 mg  100 mg Oral BID Mcarthur Rossetti, MD   100 mg at 12/29/16 2106  . feeding supplement (ENSURE ENLIVE) (ENSURE ENLIVE) liquid 237 mL  237 mL Oral BID BM Jessy Oto, MD   237 mL at 12/29/16 1307  . fluticasone furoate-vilanterol (BREO ELLIPTA) 200-25 MCG/INH 1 puff  1 puff Inhalation Daily Jessy Oto, MD   1 puff at 12/29/16 0845  . HYDROmorphone (DILAUDID) injection 1 mg  1 mg Intravenous Q2H PRN Mcarthur Rossetti, MD   1 mg at 12/29/16 0142  . insulin aspart (novoLOG) injection 0-15 Units  0-15 Units Subcutaneous TID WC Jessy Oto, MD   5 Units at 12/29/16 1714  . insulin aspart (novoLOG) injection 4 Units  4 Units Subcutaneous TID WC Jessy Oto, MD   4 Units at 12/29/16 1715  . insulin glargine (LANTUS) injection 8 Units  8 Units Subcutaneous Daily Orson Eva, MD   8 Units at  12/29/16 1009  . menthol-cetylpyridinium (CEPACOL) lozenge 3 mg  1 lozenge Oral PRN Mcarthur Rossetti, MD       Or  . phenol (CHLORASEPTIC) mouth spray 1 spray  1 spray Mouth/Throat PRN Mcarthur Rossetti, MD      . methocarbamol (ROBAXIN) tablet 500 mg  500 mg Oral Q6H PRN Mcarthur Rossetti, MD   500 mg at 12/29/16 2150   Or  . methocarbamol (ROBAXIN) 500 mg in dextrose 5 % 50 mL IVPB  500 mg Intravenous Q6H PRN Mcarthur Rossetti, MD   500 mg at 12/27/16 2028  . metoCLOPramide (REGLAN) tablet 5-10 mg  5-10 mg Oral Q8H PRN Mcarthur Rossetti,  MD       Or  . metoCLOPramide (REGLAN) injection 5-10 mg  5-10 mg Intravenous Q8H PRN Mcarthur Rossetti, MD      . morphine 4 MG/ML injection 0.52 mg  0.52 mg Intravenous Q2H PRN Jessy Oto, MD   0.52 mg at 12/27/16 2258  . ondansetron (ZOFRAN) tablet 4 mg  4 mg Oral Q6H PRN Mcarthur Rossetti, MD       Or  . ondansetron [facility]) injection 4 mg  4 mg Intravenous Q6H PRN Mcarthur Rossetti, MD   4 mg at 12/28/16 0945  . oxyCODONE (Oxy IR/ROXICODONE) immediate release tablet 5-10 mg  5-10 mg Oral Q4H PRN Jessy Oto, MD   10 mg at 12/26/16 2033  . oxyCODONE (Oxy IR/ROXICODONE) immediate release tablet 5-10 mg  5-10 mg Oral Q3H PRN Mcarthur Rossetti, MD   5 mg at 12/29/16 2106  . oxyCODONE-acetaminophen (PERCOCET/ROXICET) 5-325 MG per tablet 1-2 tablet  1-2 tablet Oral Q4H PRN Jessy Oto, MD   2 tablet at 12/29/16 2332  . polyethylene glycol (MIRALAX / GLYCOLAX) packet 17 g  17 g Oral Daily Jessy Oto, MD   17 g at 12/29/16 1008  . tiotropium (SPIRIVA) inhalation capsule 18 mcg  18 mcg Inhalation Daily Jessy Oto, MD   18 mcg at 12/29/16 0845  . traZODone (DESYREL) tablet 25 mg  25 mg Oral QHS Jessy Oto, MD   25 mg at 12/29/16 2106     Discharge Medications: Please see discharge summary for a list of discharge medications.  Relevant Imaging Results:  Relevant Lab Results:   Additional  Information SS#: [id number]  [place]on Anna, LCSW

## 2016-12-30 NOTE — Progress Notes (Signed)
CSW assisting with d/c planning. Pt has at [facility] bed at [facility] ) tomorrow if stable for d/c. CSW will continue to follow to assist with d/c planning to SNF.  Werner Lean LCSW [phone]

## 2016-12-30 NOTE — Progress Notes (Signed)
Awaiting further therapy input but anticipate pt. will need SNF due to therapy tolerance and longer recovery period afforded by SNF.  Please call if questions.  [place] Admissions Coordinator Cell [phone] Office [phone]

## 2016-12-30 NOTE — Clinical Social Work Placement (Signed)
   CLINICAL SOCIAL WORK PLACEMENT  NOTE  Date:  12/30/2016  Patient Details  Name: [name] Small MRN: [id number] Date of Birth: January 15, 1928  Clinical Social Work is seeking post-discharge placement for this patient at the [place] level of care (*CSW will initial, date and re-position this form in  chart as items are completed):  Yes   Patient/family provided with [facility] Work Department's list of facilities offering this level of care within the geographic area requested by the patient (or if unable, by the patient's family).  Yes   Patient/family informed of their freedom to choose among providers that offer the needed level of care, that participate in Medicare, Medicaid or managed care program needed by the patient, have an available bed and are willing to accept the patient.  Yes   Patient/family informed of [facility]'s ownership interest in [facility] and [facility], as well as of the fact that they are under no obligation to receive care at these facilities.  PASRR submitted to EDS on 12/30/16     PASRR number received on 12/30/16     Existing PASRR number confirmed on       FL2 transmitted to all facilities in geographic area requested by pt/family on 12/30/16     FL2 transmitted to all facilities within larger geographic area on       Patient informed that his/her managed care company has contracts with or will negotiate with certain facilities, including the following:        Yes   Patient/family informed of bed offers received.  Patient chooses bed at [place], [place]     Physician recommends and patient chooses bed at      Patient to be transferred to [place], [place] on  .  Patient to be transferred to facility by       Patient family notified on   of transfer.  Name of family member notified:        PHYSICIAN       Additional Comment:     _______________________________________________ Luretha Rued, [place] 12/30/2016, 1:38 PM

## 2016-12-31 DIAGNOSIS — D6489 Other specified anemias: Secondary | ICD-10-CM | POA: Diagnosis not present

## 2016-12-31 DIAGNOSIS — Z9181 History of falling: Secondary | ICD-10-CM | POA: Diagnosis not present

## 2016-12-31 DIAGNOSIS — J449 Chronic obstructive pulmonary disease, unspecified: Secondary | ICD-10-CM | POA: Diagnosis not present

## 2016-12-31 DIAGNOSIS — S72009A Fracture of unspecified part of neck of unspecified femur, initial encounter for closed fracture: Secondary | ICD-10-CM | POA: Diagnosis not present

## 2016-12-31 DIAGNOSIS — N189 Chronic kidney disease, unspecified: Secondary | ICD-10-CM | POA: Diagnosis not present

## 2016-12-31 DIAGNOSIS — M79605 Pain in left leg: Secondary | ICD-10-CM | POA: Diagnosis not present

## 2016-12-31 DIAGNOSIS — G4709 Other insomnia: Secondary | ICD-10-CM | POA: Diagnosis not present

## 2016-12-31 DIAGNOSIS — M6281 Muscle weakness (generalized): Secondary | ICD-10-CM | POA: Diagnosis not present

## 2016-12-31 DIAGNOSIS — R2689 Other abnormalities of gait and mobility: Secondary | ICD-10-CM | POA: Diagnosis not present

## 2016-12-31 DIAGNOSIS — L89620 Pressure ulcer of left heel, unstageable: Secondary | ICD-10-CM | POA: Diagnosis not present

## 2016-12-31 DIAGNOSIS — Z8551 Personal history of malignant neoplasm of bladder: Secondary | ICD-10-CM | POA: Diagnosis not present

## 2016-12-31 DIAGNOSIS — R278 Other lack of coordination: Secondary | ICD-10-CM | POA: Diagnosis not present

## 2016-12-31 DIAGNOSIS — Z4789 Encounter for other orthopedic aftercare: Secondary | ICD-10-CM | POA: Diagnosis not present

## 2016-12-31 DIAGNOSIS — R259 Unspecified abnormal involuntary movements: Secondary | ICD-10-CM | POA: Diagnosis not present

## 2016-12-31 DIAGNOSIS — R338 Other retention of urine: Secondary | ICD-10-CM | POA: Diagnosis not present

## 2016-12-31 DIAGNOSIS — Z96642 Presence of left artificial hip joint: Secondary | ICD-10-CM | POA: Diagnosis not present

## 2016-12-31 DIAGNOSIS — I509 Heart failure, unspecified: Secondary | ICD-10-CM | POA: Diagnosis not present

## 2016-12-31 LAB — GLUCOSE, CAPILLARY
Glucose-Capillary: 317 mg/dL — ABNORMAL HIGH (ref 65–99)
Glucose-Capillary: 192 mg/dL — ABNORMAL HIGH (ref 65–99)

## 2016-12-31 MED ORDER — OXYCODONE HCL 7.5 MG PO TABS: 7.5000 mg | ORAL_TABLET | Freq: Four times a day (QID) | ORAL | 0 refills | Status: DC | PRN

## 2016-12-31 MED ORDER — BISACODYL 10 MG RE SUPP
10.0000 mg | Freq: Once | RECTAL | Status: AC
  Administered 2016-12-31: 10 mg via RECTAL
  Filled 2016-12-31: qty 1

## 2016-12-31 NOTE — Progress Notes (Signed)
Subjective: 4 Days Post-Op Procedure(s) (LRB): LEFT TOTAL HIP ARTHROPLASTY ANTERIOR APPROACH (Left) Patient reports pain as mild.  Vitals stable and medically improved.  Slow progress with therapy.  Objective: Vital signs in last 24 hours: Temp:  [98.1 F (36.7 C)-99.5 F (37.5 C)] 98.6 F (37 C) (02/20 0623) Pulse Rate:  [72-96] 72 (02/20 0623) Resp:  [16] 16 (02/20 0623) BP: (119-134)/(44-61) 134/61 (02/20 0623) SpO2:  [91 %-95 %] 91 % (02/20 0623) Weight:  [212 lb 4.9 oz (96.3 kg)] 212 lb 4.9 oz (96.3 kg) (02/20 0623)  Intake/Output from previous day: 02/19 0701 - 02/20 0700 In: 1140 [P.O.:1140] Out: 2800 [Urine:2800] Intake/Output this shift: No intake/output data recorded.   Recent Labs  12/29/16 0424 12/30/16 0410  HGB 7.9* 8.7*    Recent Labs  12/29/16 0424 12/30/16 0410  WBC 9.6 10.2  RBC 2.46* 2.70*  HCT 23.8* 25.6*  PLT 184 204    Recent Labs  12/29/16 0424 12/30/16 0410  NA 139 137  K 4.6 4.0  CL 105 102  CO2 27 29  BUN 57* 49*  CREATININE 2.71* 2.02*  GLUCOSE 177* 198*  CALCIUM 7.4* 7.7*   No results for input(s): LABPT, INR in the last 72 hours.  Intact pulses distally Dorsiflexion/Plantar flexion intact Incision: scant drainage  Assessment/Plan: 4 Days Post-Op Procedure(s) (LRB): LEFT TOTAL HIP ARTHROPLASTY ANTERIOR APPROACH (Left) Up with therapy Discharge to SNF today.  [name] Small 12/31/2016, 7:12 AM

## 2016-12-31 NOTE — Consult Note (Signed)
   [facility] CM Inpatient Consult   12/31/2016  [name] Small December 26, 1927 [id number]     Patient screened for potential [facility] Care Management services. Chart reviewed. Noted current discharge plan is for SNF.  There are no identifiable [facility] Care Management needs at this time. If patient's post hospital needs change, please place a [facility] Care Management consult. For questions please contact:    Marthenia Rolling, [place], RN,BSN [facility] Liaison [phone]

## 2016-12-31 NOTE — Discharge Summary (Signed)
Patient ID: [name] Small MRN: [id number] DOB/AGE: 1928/02/07 83 y.o.  Admit date: 12/26/2016 Discharge date: 12/31/2016  Admission Diagnoses:  Principal Problem:   Closed left hip fracture, initial encounter [facility]) Active Problems:   Type 2 diabetes mellitus with diabetic polyneuropathy (HCC)   Hyperkalemia   Chronic diastolic CHF (congestive heart failure) (HCC)   HTN (hypertension)   COPD (chronic obstructive pulmonary disease) (HCC)   Acute renal failure superimposed on stage 3 chronic kidney disease ([place])   Status post lumbar laminectomy   S/p left hip fracture   Malnutrition of moderate degree   Hypoxia   Discharge Diagnoses:  Same  Past Medical History:  Diagnosis Date  . Arthritis   . Back pain    s/p lumbar injection 2014  . BENIGN PROSTATIC HYPERTROPHY 05/21/2007  . Bladder cancer ([place]) 10/2011  . CAD (coronary artery disease) CARDIOLOGIST- DR WALL - VISIT IN JUNE 2012 FOR CATH   nonobstructive by cath 6/12:  mid LAD 30%, proximal obtuse marginal-2 30%, proximal RCA 20%, mid RCA 30-40%.  He had normal cardiac output and mildly elevated filling pressures but no significant pulmonary hypertension;   Echocardiogram in May 2012 demonstrated EF 50-55% and left atrial enlargement   . Cellulitis of left foot    Hospitalized in 2006  . CHF (congestive heart failure) ([place])    per family, issues in 2012  . Chronic kidney disease (CKD), stage III (moderate) 12/04/2007   FOLLOWED BY DR PATEL  . Chronic pain of lower extremity   . Complication of anesthesia    1 time bladder cancer surgery- 2012 , oxygen satruratioon dropped had to stay overnight, no problems since then.  Marland Kitchen COPD (chronic obstructive pulmonary disease) ([place])   . DIABETES MELLITUS, TYPE II 05/21/2007  . DIVERTICULOSIS, COLON 05/21/2007   pt denies  . Dyspnea    with exertion, patient mointors saturation  . Fatigue AGE-RELATED  . Gross hematuria    pt denies  . HYPERTENSION 05/21/2007  . Impaired hearing  BILATERAL HEARING AIDS  . On home oxygen therapy    "2L at nighttime" (02/28/2016)  . PAD (peripheral artery disease) ([place])   . Pneumonia    Hx  of   . Psoriasis ELBOWS  . Psoriasis   . PSORIASIS, SCALP 10/25/2008    Surgeries: Procedure(s): LEFT TOTAL HIP ARTHROPLASTY ANTERIOR APPROACH on 12/26/2016 - 12/27/2016   Consultants: Treatment Team:  Redmond Baseman, MD Verlee Monte, MD  Discharged Condition: Improved  Hospital Course: ALDAN Small is an 83 y.o. male who was admitted 12/26/2016 for operative treatment ofClosed left hip fracture, initial encounter ([place]). Patient has severe unremitting pain that affects sleep, daily activities, and work/hobbies. After pre-op clearance the patient was taken to the operating room on 12/26/2016 - 12/27/2016 and underwent  Procedure(s): LEFT TOTAL HIP ARTHROPLASTY ANTERIOR APPROACH.    Patient was given perioperative antibiotics: Anti-infectives    Start     Dose/Rate Route Frequency Ordered Stop   12/30/16 0800  amoxicillin (AMOXIL) capsule 500 mg     500 mg Oral Every 8 hours 12/30/16 0741     12/27/16 2200  ceFAZolin (ANCEF) IVPB 1 g/50 mL premix     1 g 100 mL/hr over 30 Minutes Intravenous Every 6 hours 12/27/16 1902 12/28/16 1017       Patient was given sequential compression devices, early ambulation, and chemoprophylaxis to prevent DVT.  Patient benefited maximally from hospital stay and there were no complications.  Due to urinary retention, his foley cath  was kept in and will stay in until outpatient follow-up with urology.  Recent vital signs: Patient Vitals for the past 24 hrs:  BP Temp Temp src Pulse Resp SpO2 Weight  12/31/16 0623 134/61 98.6 F (37 C) Oral 72 16 91 % 212 lb 4.9 oz (96.3 kg)  12/30/16 2111 (!) 119/44 99.5 F (37.5 C) Oral 96 16 91 % -  12/30/16 1413 (!) 131/50 98.1 F (36.7 C) Oral 94 16 95 % -  12/30/16 0932 - - - - - 91 % -     Recent laboratory studies:  Recent Labs  12/29/16 0424 12/30/16 0410   WBC 9.6 10.2  HGB 7.9* 8.7*  HCT 23.8* 25.6*  PLT 184 204  NA 139 137  K 4.6 4.0  CL 105 102  CO2 27 29  BUN 57* 49*  CREATININE 2.71* 2.02*  GLUCOSE 177* 198*  CALCIUM 7.4* 7.7*     Discharge Medications:   Allergies as of 12/31/2016      Reactions   Actos [pioglitazone Hydrochloride] Other (See Comments)   Held 2012 bladder cancer   Aspirin Other (See Comments)   Held 2012 due to hematuria and bruising.    Ace Inhibitors Cough   Bactrim Rash, Other (See Comments)   Rash, presumed allergy   Gabapentin Nausea And Vomiting   Upset stomach and diarrhea - tolerates low doses   Lyrica [pregabalin] Swelling   swelling   Pravastatin Swelling   Swelling of feet   Sulfa Drugs Cross Reactors Rash      Medication List    TAKE these medications   acetaminophen 500 MG tablet Commonly known as:  TYLENOL Take 1,000 mg by mouth every 8 (eight) hours as needed for mild pain, moderate pain or headache.   albuterol (2.5 MG/3ML) 0.083% nebulizer solution Commonly known as:  PROVENTIL Take 3 mLs (2.5 mg total) by nebulization every 6 (six) hours as needed for wheezing or shortness of breath.   amLODipine 2.5 MG tablet Commonly known as:  NORVASC Take 2.5 mg by mouth daily.   atorvastatin 10 MG tablet Commonly known as:  LIPITOR Take 1 tablet (10 mg total) by mouth daily.   budesonide-formoterol 160-4.5 MCG/ACT inhaler Commonly known as:  SYMBICORT Inhale 2 puffs into the lungs 2 (two) times daily as needed (shortness of breath).   clobetasol 0.05 % external solution Commonly known as:  TEMOVATE Apply 1 application topically daily.   clopidogrel 75 MG tablet Commonly known as:  PLAVIX Take 1 tablet by mouth  daily   furosemide 40 MG tablet Commonly known as:  LASIX TAKE 1 TABLET BY MOUTH  EVERY MONDAY, WEDNESDAY,  AND FRIDAY   glipiZIDE 5 MG 24 hr tablet Commonly known as:  GLUCOTROL XL TAKE 1 TABLET BY MOUTH  DAILY WITH BREAKFAST   insulin glargine 100 UNIT/ML  injection Commonly known as:  LANTUS Inject 0.15 mLs (15 Units total) into the skin daily.   insulin lispro 100 UNIT/ML injection Commonly known as:  HUMALOG Inject 10 Units into the skin before meals if sugar is >180 prior to the meal.  Inject 5 Units into the skin before meals if sugar is 140-179 prior to the meal.  If sugar <139 or lower before meal, then skip a dose.   OxyCODONE HCl 7.5 MG Taba Take 7.5-15 mg by mouth every 6 (six) hours as needed (pain. sedation caution).   polyethylene glycol packet Commonly known as:  MIRALAX / GLYCOLAX Take 17 g by mouth daily as  needed. What changed:  reasons to take this   tiotropium 18 MCG inhalation capsule Commonly known as:  SPIRIVA Place 1 capsule (18 mcg total) into inhaler and inhale daily.            Durable Medical Equipment        Start     Ordered   12/27/16 1903  DME Walker rolling  Once    Question:  Patient needs a walker to treat with the following condition  Answer:  Status post left hip replacement   12/27/16 1902   12/27/16 1903  DME 3 n 1  Once     12/27/16 1902      Diagnostic Studies: US Renal  Result Date: 12/28/2016 CLINICAL DATA:  Patient with elevated creatinine. EXAM: RENAL / URINARY TRACT ULTRASOUND COMPLETE COMPARISON:  CT abdomen pelvis 09/04/2011 FINDINGS: Right Kidney: Length: 12.5 cm. Renal cortical thinning. No hydronephrosis. Mild increased renal cortical echogenicity. Left Kidney: Length: 13.1 cm. Renal cortical thinning. No hydronephrosis. Mild increased renal cortical echogenicity. Bladder: Masslike echogenic material within the urinary bladder. IMPRESSION: Masslike echogenic material within the posterior aspect of the urinary bladder. While this may represent intraluminal debris, bladder mass cannot be excluded. Consider correlation with urinalysis and direct visualization. No hydronephrosis. Renal cortical thinning. Electronically Signed   By: Lovey Newcomer M.D.   On: 12/28/2016 15:25   Dg  Pelvis Portable  Result Date: 12/27/2016 CLINICAL DATA:  83 year old male status post left hip replacement. Initial encounter. EXAM: PORTABLE PELVIS 1-2 VIEWS COMPARISON:  Pelvis/ left hip radiographs 12/26/2016. FINDINGS: Portable AP supine view at 1734 hours. Left bipolar hip arthroplasty now in place. Hardware appears intact and normally aligned in the AP view. The pelvis elsewhere appears stable. Incidental penile implant. No unexpected osseous changes identified. Postoperative changes to the soft tissues surrounding the left hip including subcutaneous gas. IMPRESSION: Left bipolar hip arthroplasty with no adverse features. Electronically Signed   By: Genevie Ann M.D.   On: 12/27/2016 17:53   Dg Chest Port 1 View  Result Date: 12/28/2016 CLINICAL DATA:  Hypoxia, CHF, hypertension, diabetes mellitus, COPD EXAM: PORTABLE CHEST 1 VIEW COMPARISON:  Portable exam 1307 hours compared to 12/26/2016 FINDINGS: Rotated to the RIGHT. Normal heart size, mediastinal contours, and pulmonary vascularity. Bibasilar atelectasis. Lungs otherwise clear. No pleural effusion or pneumothorax. Bones unremarkable. IMPRESSION: Bibasilar atelectasis. Electronically Signed   By: Lavonia Dana M.D.   On: 12/28/2016 13:45   Chest Portable 1 View  Result Date: 12/26/2016 CLINICAL DATA:  Preoperative examination. Patient for left hip replacement. EXAM: PORTABLE CHEST 1 VIEW COMPARISON:  Single view of the chest 06/10/2016. PA and lateral chest 01/20/2014. FINDINGS: The lungs appear emphysematous but are clear. Heart size is normal. No pneumothorax or pleural effusion. No focal bony abnormality. IMPRESSION: No acute disease.  The lungs appear emphysematous. Electronically Signed   By: Inge Rise M.D.   On: 12/26/2016 14:58   Dg C-arm 61-120 Min-no Report  Result Date: 12/27/2016 Fluoroscopy was utilized by the requesting physician.  No radiographic interpretation.   Dg Hip Operative Unilat With Pelvis Left  Result Date:  12/27/2016 CLINICAL DATA:  Status post left hip replacement. EXAM: DG C-ARM 61-120 MIN-NO REPORT; OPERATIVE LEFT HIP WITH PELVIS FLUOROSCOPY TIME:  38 seconds. COMPARISON:  Radiographs of December 26, 2016. FINDINGS: Two intraoperative fluoroscopic images of the left hip demonstrate the patient be status post left total hip arthroplasty. The femoral and acetabular components are well situated. No dislocation is noted. Expected postoperative changes  are seen involving the surrounding soft tissues. IMPRESSION: Status post left total hip arthroplasty. Electronically Signed   By: Marijo Conception, M.D.   On: 12/27/2016 16:56   Xr Hip Unilat W Or W/o Pelvis 2-3 Views Left  Result Date: 12/26/2016 AP pelvis and lateral of the left hip show left hip fracture through the mid femoral neck with bone resorption and varus deformity. Shortening of the left femoral neck by 3/4 inch, loss of normal Shenton's line. Findings consistent with subacute or chronic left femoral neck fracture. Moderately severe narrowing of the inferomedial left hip joint line. Narrowing of the superolateral joint line though the superomedial joint line is well maintained.Right hip with decreased contour of the lateral head and neck, no lucency, mild degenerative changes of the right hip.    Xr Knee 1-2 Views Left  Result Date: 12/26/2016 Left knee AP and lateral radiographs, show the medial joint line minimally narrowed, no significant osteophytes, lateral radiograph with patella baja no fracture, dislocation or subluxation. Minimal medial joint line narrowing, minimal degenerative changes.   Disposition: to skilled nursing  Discharge Instructions    Discharge patient    Complete by:  As directed    Discharge disposition:  03-Skilled [place]   Discharge patient date:  12/31/2016      Follow-up Information    [facility] Follow up.   Specialty:  Home Health Services Why:  Home Health Physical Therapy Contact  information: [address] [place] [address] [phone]        Mcarthur Rossetti, MD. Schedule an appointment as soon as possible for a visit in 2 week(s).   Specialty:  Orthopedic Surgery Contact information: [place] [address] [phone]        Ardis Hughs, MD. Schedule an appointment as soon as possible for a visit in 2 week(s).   Specialty:  Urology Why:  Needs follow-up with [facility] due to history of bladder cancer and questionable debris vs scar tissue vs lesion seen on ultrasound Contact information: [place] [address] [phone]            Signed: Mcarthur Rossetti 12/31/2016, 7:15 AM

## 2016-12-31 NOTE — Progress Notes (Signed)
Spoke with Dr.Blackman, who ordered the suppository and said to pass along to [facility] Nurse that we gave patient colace, miralax and suppository. Spoke with [name] Catalina, RN at [facility] again to make her aware, there was confusion with patients last BM date. Patient and family are both comfortable with going to [facility] even if suppository doesn't take effect quite yet.

## 2016-12-31 NOTE — Progress Notes (Signed)
Per charting, patient reported to night shift he had a BM yesterday. Per patient today, he is unsure of last BM and if he had one last night, he said it could possibly be last Tuesday 02/13. Spoke with Dr.Blackman, who ordered a dulcolax suppository.

## 2016-12-31 NOTE — Progress Notes (Signed)
Physical Therapy Treatment Patient Details Name: [name] Small MRN: [id number] DOB: 1928-03-23 Today's Date: 12/31/2016    History of Present Illness Left hip fracture x several month duraion. s/P DA THA 12/27/16, H/O L/ trans metatarsal amputation    PT Comments    POD # 4 Assisted OOB to amb then back to bed per pt request to rest.   Follow Up Recommendations  SNF ([facility])     Equipment Recommendations       Recommendations for Other Services       Precautions / Restrictions Precautions Precautions: Fall Precaution Comments: needs orthotic shoes for transmet amp on L (spouse states they are at home) but did bring in sneakers Restrictions Weight Bearing Restrictions: No LLE Weight Bearing: Weight bearing as tolerated    Mobility  Bed Mobility Overal bed mobility: Needs Assistance Bed Mobility: Supine to Sit     Supine to sit: Mod assist;Max assist     General bed mobility comments: utilizing bed pad to complete scooting to EOB and assist with L LE off bed  Transfers Overall transfer level: Needs assistance Equipment used: Rolling walker (2 wheeled) Transfers: Sit to/from Stand Sit to Stand: Mod assist;+2 physical assistance;+2 safety/equipment;From elevated surface         General transfer comment: 50% VC's on proper hand placement and initial posterior lean.  Also, uncontrolled decend to recliner.  Pt admits to using a lift chair at home.    Ambulation/Gait Ambulation/Gait assistance: Min assist;Mod assist;+2 safety/equipment Ambulation Distance (Feet): 62 Feet Assistive device: Rolling walker (2 wheeled) Gait Pattern/deviations: Step-to pattern;Decreased stance time - left Gait velocity: decreased   General Gait Details: very unsteady gait requiring 50% VC's to decrease speed and proper walker to self distance.  + 2 assist for safety and recliner following.     Stairs            Wheelchair Mobility    Modified Rankin (Stroke Patients  Only)       Balance                                    Cognition Arousal/Alertness: Awake/alert Behavior During Therapy: WFL for tasks assessed/performed Overall Cognitive Status: Within Functional Limits for tasks assessed                 General Comments: HOH    Exercises      General Comments        Pertinent Vitals/Pain Pain Assessment: No/denies pain Pain Score: 7  Pain Location: L hip Pain Descriptors / Indicators: Discomfort;Grimacing;Operative site guarding Pain Intervention(s): Monitored during session;Repositioned;Ice applied    Home Living                      Prior Function            PT Goals (current goals can now be found in the care plan section) Progress towards PT goals: Progressing toward goals    Frequency    7X/week      PT Plan Current plan remains appropriate    Co-evaluation             End of Session Equipment Utilized During Treatment: Gait belt Activity Tolerance: Patient tolerated treatment well Patient left: in bed;with bed alarm set;with call bell/phone within reach;with family/visitor present Nurse Communication: Mobility status PT Visit Diagnosis: Unsteadiness on feet (R26.81)     Time: 6659-9357 PT  Time Calculation (min) (ACUTE ONLY): 18 min  Charges:  $Gait Training: 8-22 mins                    G Codes:       Rica Koyanagi  PTA [facility]  Acute  Rehab Pager      [phone]

## 2016-12-31 NOTE — Progress Notes (Signed)
Called report to [facility], to [facility] who will be receiving him into [facility]. She stated she had no questions at this time. 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

## 2016-12-31 NOTE — Care Management Important Message (Signed)
Important Message  Patient Details  Name: ANTERRIO MCCLEERY MRN: [id number] Date of Birth: [birth year]   Medicare Important Message Given:  Yes    Kerin Salen 12/31/2016, 1:14 PMImportant Message  Patient Details  Name: GEDALIA MCMILLON MRN: [id number] Date of Birth: 10/30/1928   Medicare Important Message Given:  Yes    Kerin Salen 12/31/2016, 1:14 PM

## 2016-12-31 NOTE — Discharge Instructions (Signed)

## 2016-12-31 NOTE — NC FL2 (Signed)
[place] MEDICAID FL2 LEVEL OF CARE SCREENING TOOL     IDENTIFICATION  Patient Name: [name] Small Birthdate: [birth year] Sex: male Admission Date (Current Location): 12/26/2016  [facility] and [place] Number:  Herbalist and Address:  [facility],  [address], [place]      Provider Number: [id number]  Attending Physician Name and Address:  Jessy Oto, MD  Relative Name and Phone Number:       Current Level of Care: Hospital Recommended Level of Care: [place] Prior Approval Number:    Date Approved/Denied:   PASRR Number:    [id number]  Discharge Plan: SNF    Current Diagnoses: Patient Active Problem List   Diagnosis Date Noted  . Malnutrition of moderate degree 12/28/2016  . Hypoxia   . Closed left hip fracture, initial encounter ([place]) 12/26/2016    Class: Chronic  . Status post lumbar laminectomy 12/26/2016    Class: Chronic  . S/p left hip fracture 12/26/2016  . Idiopathic chronic venous hypertension of left lower extremity with inflammation 11/07/2016  . Subacute left lumbar radiculopathy 10/18/2016    Class: Chronic  . Spinal stenosis of lumbar region with neurogenic claudication 10/18/2016  . Skin ulcer ([place]) 09/25/2016  . Spinal stenosis of lumbar region 09/25/2016  . Fall 07/04/2016  . Osteomyelitis of left foot ([place])   . Acute renal failure superimposed on stage 3 chronic kidney disease ([place])   . Osteomyelitis ([place]) 06/10/2016  . COPD (chronic obstructive pulmonary disease) ([place]) 06/10/2016  . Right second toe ulcer ([place]) 05/20/2016  . Diabetic ulcer of left foot ([place]) 03/24/2016  . Diabetic ulcer of left foot associated with diabetes mellitus due to underlying condition ([place])   . Weakness 03/18/2016  . Mood disorder ([place]) 03/18/2016  . Groin hematoma   . Chronic renal failure   . Critical lower limb ischemia 02/28/2016  . Peripheral arterial disease ([place]) 10/17/2015  . Edema 07/19/2015  .  Claudication ([place]) 01/31/2015  . DOE (dyspnea on exertion) 01/31/2015  . Hyperlipidemia 01/31/2015  . HTN (hypertension) 01/31/2015  . Chronic diastolic CHF (congestive heart failure) ([place]) 01/21/2014  . Seborrheic keratoses 04/30/2013  . CHF (congestive heart failure) ([employer]) 12/16/2012  . Atrial fibrillation ([place]) 12/16/2012  . Hyperkalemia 11/27/2011  . Bladder cancer ([place]) 09/22/2011  . Leg cramps 08/28/2011  . Cough 05/26/2011  . CAD (coronary artery disease) of artery bypass graft 05/09/2011  . Dyspnea on exertion 04/19/2011  . PSORIASIS, SCALP 10/25/2008  . Chronic kidney disease, stage III (moderate) 12/04/2007  . Type 2 diabetes mellitus with diabetic polyneuropathy ([place]) 05/21/2007  . DIVERTICULOSIS, COLON 05/21/2007  . BPH (benign prostatic hyperplasia) 05/21/2007    Orientation RESPIRATION BLADDER Height & Weight     Self, Time, Situation, Place  O2 (1 liter) Continent Weight: 212 lb 4.9 oz (96.3 kg) Height:  6' 2.02" (188 cm)  BEHAVIORAL SYMPTOMS/MOOD NEUROLOGICAL BOWEL NUTRITION STATUS      Continent Diet (Carb modified )  AMBULATORY STATUS COMMUNICATION OF NEEDS Skin   Limited Assist Verbally Other (Comment) (Incision- Hip, 2 incisions- left sacrum)                       Personal Care Assistance Level of Assistance  Bathing, Dressing, Feeding Bathing Assistance: Limited assistance Feeding assistance: Independent Dressing Assistance: Limited assistance     Functional Limitations Info             SPECIAL CARE FACTORS FREQUENCY  PT (By licensed PT), OT (By licensed OT)     PT Frequency: 5 OT Frequency: 5            Contractures      Additional Factors Info  Code Status, Allergies Code Status Info: Full code  Allergies Info: ACTOS PIOGLITAZONE HYDROCHLORIDE, ASPIRIN, ACE INHIBITORS, BACTRIM, GABAPENTIN, LYRICA PREGABALIN, PRAVASTATIN, SULFA DRUGS CROSS REACTORS            Current Medications (12/31/2016):  This is the current hospital  active medication list Current Facility-Administered Medications  Medication Dose Route Frequency Provider Last Rate Last Dose  . acetaminophen (TYLENOL) tablet 650 mg  650 mg Oral Q6H PRN Mcarthur Rossetti, MD       Or  . acetaminophen (TYLENOL) suppository 650 mg  650 mg Rectal Q6H PRN Mcarthur Rossetti, MD      . acetaminophen (TYLENOL) tablet 1,000 mg  1,000 mg Oral Q8H PRN Jessy Oto, MD      . albuterol (PROVENTIL) (2.5 MG/3ML) 0.083% nebulizer solution 2.5 mg  2.5 mg Nebulization Q6H PRN Jessy Oto, MD      . alum & mag hydroxide-simeth (MAALOX/MYLANTA) 200-200-20 MG/5ML suspension 30 mL  30 mL Oral Q4H PRN Mcarthur Rossetti, MD      . amoxicillin (AMOXIL) capsule 500 mg  500 mg Oral Q8H Verlee Monte, MD   500 mg at 12/31/16 7322  . atorvastatin (LIPITOR) tablet 10 mg  10 mg Oral q1800 Jessy Oto, MD   10 mg at 12/30/16 1729  . clobetasol (TEMOVATE) 0.05 % external solution 1 application  1 application Topical Daily Jessy Oto, MD   1 application at 02/54/27 1000  . clopidogrel (PLAVIX) tablet 75 mg  75 mg Oral Daily Mcarthur Rossetti, MD   75 mg at 12/30/16 0903  . docusate sodium (COLACE) capsule 100 mg  100 mg Oral BID Mcarthur Rossetti, MD   100 mg at 12/30/16 2147  . feeding supplement (ENSURE ENLIVE) (ENSURE ENLIVE) liquid 237 mL  237 mL Oral BID BM Jessy Oto, MD   237 mL at 12/30/16 1354  . fluticasone furoate-vilanterol (BREO ELLIPTA) 200-25 MCG/INH 1 puff  1 puff Inhalation Daily Jessy Oto, MD   1 puff at 12/30/16 0932  . HYDROmorphone (DILAUDID) injection 1 mg  1 mg Intravenous Q2H PRN Mcarthur Rossetti, MD   1 mg at 12/30/16 2322  . insulin aspart (novoLOG) injection 0-15 Units  0-15 Units Subcutaneous TID WC Jessy Oto, MD   5 Units at 12/30/16 1729  . insulin aspart (novoLOG) injection 4 Units  4 Units Subcutaneous TID WC Jessy Oto, MD   4 Units at 12/30/16 1729  . insulin glargine (LANTUS) injection 8 Units  8 Units  Subcutaneous Daily Orson Eva, MD   8 Units at 12/30/16 1032  . menthol-cetylpyridinium (CEPACOL) lozenge 3 mg  1 lozenge Oral PRN Mcarthur Rossetti, MD       Or  . phenol (CHLORASEPTIC) mouth spray 1 spray  1 spray Mouth/Throat PRN Mcarthur Rossetti, MD      . methocarbamol (ROBAXIN) tablet 500 mg  500 mg Oral Q6H PRN Mcarthur Rossetti, MD   500 mg at 12/30/16 2322   Or  . methocarbamol (ROBAXIN) 500 mg in dextrose 5 % 50 mL IVPB  500 mg Intravenous Q6H PRN Mcarthur Rossetti, MD   500 mg at 12/27/16 2028  . metoCLOPramide (REGLAN) tablet 5-10 mg  5-10 mg  Oral Q8H PRN Mcarthur Rossetti, MD       Or  . metoCLOPramide (REGLAN) injection 5-10 mg  5-10 mg Intravenous Q8H PRN Mcarthur Rossetti, MD      . morphine 4 MG/ML injection 0.52 mg  0.52 mg Intravenous Q2H PRN Jessy Oto, MD   0.52 mg at 12/27/16 2258  . ondansetron (ZOFRAN) tablet 4 mg  4 mg Oral Q6H PRN Mcarthur Rossetti, MD       Or  . ondansetron [facility]) injection 4 mg  4 mg Intravenous Q6H PRN Mcarthur Rossetti, MD   4 mg at 12/28/16 0945  . oxyCODONE (Oxy IR/ROXICODONE) immediate release tablet 5-10 mg  5-10 mg Oral Q4H PRN Jessy Oto, MD   10 mg at 12/26/16 2033  . oxyCODONE (Oxy IR/ROXICODONE) immediate release tablet 5-10 mg  5-10 mg Oral Q3H PRN Mcarthur Rossetti, MD   10 mg at 12/30/16 2147  . oxyCODONE-acetaminophen (PERCOCET/ROXICET) 5-325 MG per tablet 1-2 tablet  1-2 tablet Oral Q4H PRN Jessy Oto, MD   2 tablet at 12/29/16 2332  . polyethylene glycol (MIRALAX / GLYCOLAX) packet 17 g  17 g Oral Daily Jessy Oto, MD   17 g at 12/30/16 0903  . tiotropium (SPIRIVA) inhalation capsule 18 mcg  18 mcg Inhalation Daily Jessy Oto, MD   18 mcg at 12/30/16 0932  . traZODone (DESYREL) tablet 25 mg  25 mg Oral QHS Jessy Oto, MD   25 mg at 12/30/16 2147     Discharge Medications: Please see discharge summary for a list of discharge medications.  Relevant Imaging  Results:  Relevant Lab Results:   Additional Information SS#: [id number]  Ruddy Swire, Randall An, LCSW

## 2016-12-31 NOTE — Clinical Social Work Placement (Signed)
   CLINICAL SOCIAL WORK PLACEMENT  NOTE  Date:  12/31/2016  Patient Details  Name: [name] Small MRN: [id number] Date of Birth: 1928/07/04  Clinical Social Work is seeking post-discharge placement for this patient at the [place] level of care (*CSW will initial, date and re-position this form in  chart as items are completed):  Yes   Patient/family provided with [facility] Work Department's list of facilities offering this level of care within the geographic area requested by the patient (or if unable, by the patient's family).  Yes   Patient/family informed of their freedom to choose among providers that offer the needed level of care, that participate in Medicare, Medicaid or managed care program needed by the patient, have an available bed and are willing to accept the patient.  Yes   Patient/family informed of [facility]'s ownership interest in [facility] and [facility], as well as of the fact that they are under no obligation to receive care at these facilities.  PASRR submitted to EDS on 12/30/16     PASRR number received on 12/30/16     Existing PASRR number confirmed on       FL2 transmitted to all facilities in geographic area requested by pt/family on 12/30/16     FL2 transmitted to all facilities within larger geographic area on       Patient informed that his/her managed care company has contracts with or will negotiate with certain facilities, including the following:        Yes   Patient/family informed of bed offers received.  Patient chooses bed at [place], [place]     Physician recommends and patient chooses bed at      Patient to be transferred to [facility] on 12/31/16.  Patient to be transferred to facility by [facility]     Patient family notified on 12/31/16 of transfer.  Name of family member notified:  SPOUSE     PHYSICIAN       Additional Comment: Pt / spouse are in agreement with d/c  to [facility] ( [facility] ) today. [facility] transport required. Medical necessity form completed. Spouse is aware that out of pocket costs may be associated with [facility] transport. Scripts included in d/c packet. D/C Summary sent to SNF for review. # for report provided to nsg.   _______________________________________________ Luretha Rued, [place] 12/31/2016, 1:07 PM

## 2017-01-01 DIAGNOSIS — G4709 Other insomnia: Secondary | ICD-10-CM | POA: Diagnosis not present

## 2017-01-01 DIAGNOSIS — D6489 Other specified anemias: Secondary | ICD-10-CM | POA: Diagnosis not present

## 2017-01-01 DIAGNOSIS — Z96642 Presence of left artificial hip joint: Secondary | ICD-10-CM | POA: Diagnosis not present

## 2017-01-01 DIAGNOSIS — N189 Chronic kidney disease, unspecified: Secondary | ICD-10-CM | POA: Diagnosis not present

## 2017-01-01 DIAGNOSIS — J449 Chronic obstructive pulmonary disease, unspecified: Secondary | ICD-10-CM | POA: Diagnosis not present

## 2017-01-07 DIAGNOSIS — L89620 Pressure ulcer of left heel, unstageable: Secondary | ICD-10-CM | POA: Diagnosis not present

## 2017-01-07 DIAGNOSIS — R338 Other retention of urine: Secondary | ICD-10-CM | POA: Diagnosis not present

## 2017-01-10 DIAGNOSIS — R338 Other retention of urine: Secondary | ICD-10-CM | POA: Diagnosis not present

## 2017-01-10 DIAGNOSIS — Z8551 Personal history of malignant neoplasm of bladder: Secondary | ICD-10-CM | POA: Diagnosis not present

## 2017-01-13 ENCOUNTER — Inpatient Hospital Stay (INDEPENDENT_AMBULATORY_CARE_PROVIDER_SITE_OTHER): Payer: Medicare Other | Admitting: Physician Assistant

## 2017-01-13 ENCOUNTER — Telehealth: Payer: Self-pay | Admitting: Family Medicine

## 2017-01-13 NOTE — Telephone Encounter (Signed)
Patient's daughter Arrie Aran) notified as instructed by telephone and verbalized understanding. Dawn stated that her dad had an appointment scheduled today with the surgeon, but transportation did not show up to take him. Dawn stated that they had to reschedule the appointment for Thursday with the surgeon. Dawn stated that her next call will probably be to the orthopedic surgeon about extending his stay.

## 2017-01-13 NOTE — Telephone Encounter (Signed)
I will see them at the visit.  I don't think that I can influence the length of stay- usually this comes down to the condition of the patient.  If he still needs SNF care, then usually the length of stay increases.  Thanks.

## 2017-01-13 NOTE — Telephone Encounter (Signed)
Daughter called and said her father is in [facility] after having hip replacement 2 weeks ago by [employer]. He is supposed to go home this Thursday. He has a catheter in also. Daughter, Dale [place] is asking if you can get his stay extended at the Nursing home because he is in bad shape. He has an Ortho followup this Thursday and a followup appt with you this Friday. I told Dawn that I thought the Orthopedic surgeon overseeing his care would be the one to talk to about extending his stay at [facility] but she asked me to send you this message. I asked her if she wanted to cancel the appt with you this Friday but she said no that her sister would bring him. Dawns phone number is [phone].

## 2017-01-14 ENCOUNTER — Encounter: Payer: Self-pay | Admitting: Family Medicine

## 2017-01-14 DIAGNOSIS — L89620 Pressure ulcer of left heel, unstageable: Secondary | ICD-10-CM | POA: Diagnosis not present

## 2017-01-16 ENCOUNTER — Encounter (INDEPENDENT_AMBULATORY_CARE_PROVIDER_SITE_OTHER): Payer: Self-pay | Admitting: Physician Assistant

## 2017-01-16 ENCOUNTER — Ambulatory Visit (INDEPENDENT_AMBULATORY_CARE_PROVIDER_SITE_OTHER): Payer: Medicare Other | Admitting: Physician Assistant

## 2017-01-16 DIAGNOSIS — Z96642 Presence of left artificial hip joint: Secondary | ICD-10-CM

## 2017-01-16 NOTE — Progress Notes (Signed)
Post-Op Visit Note   Patient: [name] Small           Date of Birth: [birth year]           MRN: [id number] Visit Date: 01/16/2017 PCP: [name], MD   Assessment & Plan:  Chief Complaint:  Chief Complaint  Patient presents with  . Left Hip - Routine Post Op   Visit Diagnoses:  1. Status post total replacement of left hip   Plavix and HPI: [name] Small turns today 3 weeks status post left total hip arthroplasty. He is overall doing well knee does ambulate with a walker. He's had no fevers chills shortness breath calf pain. He is on Plavix and Xarelto.  PE: Surgical incision is healing well no signs of infection. Skin is well approximated with a running subcutaneous stitch. There is no sign of seroma. Left calf supple nontender. He has some stiffness with range of motion of the left hip as one would expect. Plan: Continue to work with physical therapy for overall strengthening and range of motion of the left hip. Scar tissue mobilization encouraged. Follow-up in one month or sooner if there is any signs of infection about the incision site  Follow-Up Instructions: Return in about 4 weeks (around 02/13/2017) for post op.   Orders:  No orders of the defined types were placed in this encounter.  No orders of the defined types were placed in this encounter.   Imaging: No results found.  PMFS History: Patient Active Problem List   Diagnosis Date Noted  . Malnutrition of moderate degree 12/28/2016  . Hypoxia   . Closed left hip fracture, initial encounter ([place]) 12/26/2016    Class: Chronic  . Status post lumbar laminectomy 12/26/2016    Class: Chronic  . S/p left hip fracture 12/26/2016  . Idiopathic chronic venous hypertension of left lower extremity with inflammation 11/07/2016  . Subacute left lumbar radiculopathy 10/18/2016    Class: Chronic  . Spinal stenosis of lumbar region with neurogenic claudication 10/18/2016  . Skin ulcer ([place]) 09/25/2016  . Spinal stenosis  of lumbar region 09/25/2016  . Fall 07/04/2016  . Osteomyelitis of left foot ([place])   . Acute renal failure superimposed on stage 3 chronic kidney disease ([place])   . Osteomyelitis ([place]) 06/10/2016  . COPD (chronic obstructive pulmonary disease) ([place]) 06/10/2016  . Right second toe ulcer ([place]) 05/20/2016  . Diabetic ulcer of left foot ([place]) 03/24/2016  . Diabetic ulcer of left foot associated with diabetes mellitus due to underlying condition ([place])   . Weakness 03/18/2016  . Mood disorder ([place]) 03/18/2016  . Groin hematoma   . Chronic renal failure   . Critical lower limb ischemia 02/28/2016  . Peripheral arterial disease ([place]) 10/17/2015  . Edema 07/19/2015  . Claudication ([place]) 01/31/2015  . DOE (dyspnea on exertion) 01/31/2015  . Hyperlipidemia 01/31/2015  . HTN (hypertension) 01/31/2015  . Chronic diastolic CHF (congestive heart failure) ([place]) 01/21/2014  . Seborrheic keratoses 04/30/2013  . CHF (congestive heart failure) ([place]) 12/16/2012  . Atrial fibrillation ([place]) 12/16/2012  . Hyperkalemia 11/27/2011  . Bladder cancer ([place]) 09/22/2011  . Leg cramps 08/28/2011  . Cough 05/26/2011  . CAD (coronary artery disease) of artery bypass graft 05/09/2011  . Dyspnea on exertion 04/19/2011  . PSORIASIS, SCALP 10/25/2008  . Chronic kidney disease, stage III (moderate) 12/04/2007  . Type 2 diabetes mellitus with diabetic polyneuropathy ([place]) 05/21/2007  . DIVERTICULOSIS, COLON 05/21/2007  . BPH (benign prostatic hyperplasia) 05/21/2007   Past  Medical History:  Diagnosis Date  . Arthritis   . Back pain    s/p lumbar injection 2014  . BENIGN PROSTATIC HYPERTROPHY 05/21/2007  . Bladder cancer ([place]) 10/2011  . CAD (coronary artery disease) CARDIOLOGIST- DR WALL - VISIT IN JUNE 2012 FOR CATH   nonobstructive by cath 6/12:  mid LAD 30%, proximal obtuse marginal-2 30%, proximal RCA 20%, mid RCA 30-40%.  He had normal cardiac output and mildly elevated filling pressures but no significant  pulmonary hypertension;   Echocardiogram in May 2012 demonstrated EF 50-55% and left atrial enlargement   . Cellulitis of left foot    Hospitalized in 2006  . CHF (congestive heart failure) ([place])    per family, issues in 2012  . Chronic kidney disease (CKD), stage III (moderate) 12/04/2007   FOLLOWED BY DR PATEL  . Chronic pain of lower extremity   . Complication of anesthesia    1 time bladder cancer surgery- 2012 , oxygen satruratioon dropped had to stay overnight, no problems since then.  Marland Kitchen COPD (chronic obstructive pulmonary disease) ([place])   . DIABETES MELLITUS, TYPE II 05/21/2007  . DIVERTICULOSIS, COLON 05/21/2007   pt denies  . Dyspnea    with exertion, patient mointors saturation  . Fatigue AGE-RELATED  . Gross hematuria    pt denies  . HYPERTENSION 05/21/2007  . Impaired hearing BILATERAL HEARING AIDS  . On home oxygen therapy    "2L at nighttime" (02/28/2016)  . PAD (peripheral artery disease) ([place])   . Pneumonia    Hx  of   . Psoriasis ELBOWS  . Psoriasis   . PSORIASIS, SCALP 10/25/2008    Family History  Problem Relation Age of Onset  . Diabetes Mother   . Heart disease Brother   . Heart disease Brother   . Heart disease Brother   . Heart disease Brother   . Heart disease Brother   . Heart disease Brother   . Lung cancer Brother     Past Surgical History:  Procedure Laterality Date  . AMPUTATION Left 03/27/2016   Procedure: partial first AMPUTATION RAY; LEFT;  Surgeon: Trula Slade, DPM;  Location: [facility];  Service: Podiatry;  Laterality: Left;  . AMPUTATION Left 06/12/2016   Procedure: TRANSMETATARSAL AMPUTATION LEFT FOOT;  Surgeon: Newt Minion, MD;  Location: [facility];  Service: Orthopedics;  Laterality: Left;  . APPENDECTOMY  1941  . CARDIAC CATHETERIZATION  04-24-11/  DR Rogue Jury ARIDA   MILD NONOBSTRUCTIVE CAD, NORMA CARDIAC OUTPUT  . CARDIOVASCULAR STRESS TEST  2007  . CATARACT EXTRACTION W/ INTRAOCULAR LENS  IMPLANT, BILATERAL Bilateral ~ 2010  .  CYSTOSCOPY  12/25/2011   Procedure: CYSTOSCOPY;  Surgeon: Molli Hazard, MD;  Location: [facility];  Service: Urology;  Laterality: N/A;  needs intubation and to be paralyzed   . CYSTOSCOPY W/ RETROGRADES  10/30/2011   Procedure: CYSTOSCOPY WITH RETROGRADE PYELOGRAM;  Surgeon: Molli Hazard, MD;  Location[facility];  Service: Urology;  Laterality: Bilateral;  CYSTOSCOPY POSS TURBT BILATERAL RETROGRADE PYLEOGRAM   . CYSTOSCOPY W/ RETROGRADES  11/30/2012   Procedure: CYSTOSCOPY WITH RETROGRADE PYELOGRAM;  Surgeon: Molli Hazard, MD;  Location: [facility];  Service: Urology;  Laterality: Bilateral;  Flexible cystoscopy.  . CYSTOSCOPY WITH BIOPSY  11/30/2012   Procedure: CYSTOSCOPY WITH BIOPSY;  Surgeon: Molli Hazard, MD;  Location: [facility];  Service: Urology;  Laterality: N/A;  . INCISION AND DRAINAGE FOOT Left ~ 2005  LEFT FOOT DUE TO INFECTION FROM  NAIL PUNCTURE INJURY  . LUMBAR LAMINECTOMY/DECOMPRESSION MICRODISCECTOMY N/A 10/18/2016   Procedure: LEFT AND CENTRAL L4-5 LUMBAR LAMINECTOMY WITH RESECTION OF SYNOVIAL CYST;  Surgeon: Jessy Oto, MD;  Location: [facility];  Service: Orthopedics;  Laterality: N/A;  . PENILE PROSTHESIS IMPLANT  1990s  . PERIPHERAL VASCULAR CATHETERIZATION N/A 02/14/2016   Procedure: Abdominal Aortogram w/Lower Extremity;  Surgeon: Wellington Hampshire, MD;  Location: [place];  Service: Cardiovascular;  Laterality: N/A;  . PERIPHERAL VASCULAR CATHETERIZATION Left 02/28/2016   Procedure: Peripheral Vascular Balloon Angioplasty;  Surgeon: Wellington Hampshire, MD;  Location: [facility];  Service: Cardiovascular;  Laterality: Left;  left peroneal and popliteal artery  . [place]  . TOTAL HIP ARTHROPLASTY Left 12/27/2016   Procedure: LEFT TOTAL HIP ARTHROPLASTY ANTERIOR APPROACH;  Surgeon: Mcarthur Rossetti, MD;  Location: [facility];   Service: Orthopedics;  Laterality: Left;  . TRANSURETHRAL RESECTION OF BLADDER TUMOR  10/30/2011   Procedure: TRANSURETHRAL RESECTION OF BLADDER TUMOR (TURBT);  Surgeon: Molli Hazard, MD;  Location: [facility];  Service: Urology;  Laterality: N/A;  . TRANSURETHRAL RESECTION OF BLADDER TUMOR  12/25/2011   Procedure: TRANSURETHRAL RESECTION OF BLADDER TUMOR (TURBT);  Surgeon: Molli Hazard, MD;  Location: [facility];  Service: Urology;  Laterality: N/A;  need long gyrus instruments   . VASECTOMY  1990s   Social History   Occupational History  . retired      [employer]	[place].   Social History Main Topics  . Smoking status: Former Smoker    Packs/day: 2.00    Years: 22.00    Types: Cigarettes    Quit date: 11/11/1968  . Smokeless tobacco: Never Used  . Alcohol use No     Comment: occassional- 10/17/16- none  in 4 months- "too sick"  . Drug use: No  . Sexual activity: Not Currently

## 2017-01-17 ENCOUNTER — Telehealth: Payer: Self-pay

## 2017-01-17 ENCOUNTER — Ambulatory Visit: Payer: Medicare Other | Admitting: Family Medicine

## 2017-01-17 DIAGNOSIS — Z466 Encounter for fitting and adjustment of urinary device: Secondary | ICD-10-CM | POA: Diagnosis not present

## 2017-01-17 DIAGNOSIS — S72002D Fracture of unspecified part of neck of left femur, subsequent encounter for closed fracture with routine healing: Secondary | ICD-10-CM | POA: Diagnosis not present

## 2017-01-17 DIAGNOSIS — I2510 Atherosclerotic heart disease of native coronary artery without angina pectoris: Secondary | ICD-10-CM | POA: Diagnosis not present

## 2017-01-17 DIAGNOSIS — L89620 Pressure ulcer of left heel, unstageable: Secondary | ICD-10-CM | POA: Diagnosis not present

## 2017-01-17 DIAGNOSIS — N183 Chronic kidney disease, stage 3 (moderate): Secondary | ICD-10-CM | POA: Diagnosis not present

## 2017-01-17 DIAGNOSIS — I130 Hypertensive heart and chronic kidney disease with heart failure and stage 1 through stage 4 chronic kidney disease, or unspecified chronic kidney disease: Secondary | ICD-10-CM | POA: Diagnosis not present

## 2017-01-17 DIAGNOSIS — Z96642 Presence of left artificial hip joint: Secondary | ICD-10-CM | POA: Diagnosis not present

## 2017-01-17 DIAGNOSIS — E1151 Type 2 diabetes mellitus with diabetic peripheral angiopathy without gangrene: Secondary | ICD-10-CM | POA: Diagnosis not present

## 2017-01-17 DIAGNOSIS — J449 Chronic obstructive pulmonary disease, unspecified: Secondary | ICD-10-CM | POA: Diagnosis not present

## 2017-01-17 DIAGNOSIS — I5032 Chronic diastolic (congestive) heart failure: Secondary | ICD-10-CM | POA: Diagnosis not present

## 2017-01-17 DIAGNOSIS — M7989 Other specified soft tissue disorders: Secondary | ICD-10-CM | POA: Diagnosis not present

## 2017-01-17 DIAGNOSIS — R1909 Other intra-abdominal and pelvic swelling, mass and lump: Secondary | ICD-10-CM | POA: Diagnosis not present

## 2017-01-17 DIAGNOSIS — E1122 Type 2 diabetes mellitus with diabetic chronic kidney disease: Secondary | ICD-10-CM | POA: Diagnosis not present

## 2017-01-17 NOTE — Telephone Encounter (Signed)
Estill Bamberg nurse with [facility] left v/m requesting verbal orders for [facility] nursing 2 x a week for 4 weeks and 1 x a week for 4 weeks. This will be for wound care of lt heel and mgt of catheter.

## 2017-01-19 NOTE — Telephone Encounter (Signed)
Please give the order.  Thanks.   

## 2017-01-20 NOTE — Telephone Encounter (Signed)
Left message on voicemail for [name] Small to call back.

## 2017-01-20 NOTE — Telephone Encounter (Signed)
Verbal order given to [facility] by telephone as instructed.

## 2017-01-21 DIAGNOSIS — I130 Hypertensive heart and chronic kidney disease with heart failure and stage 1 through stage 4 chronic kidney disease, or unspecified chronic kidney disease: Secondary | ICD-10-CM | POA: Diagnosis not present

## 2017-01-21 DIAGNOSIS — M7989 Other specified soft tissue disorders: Secondary | ICD-10-CM | POA: Diagnosis not present

## 2017-01-21 DIAGNOSIS — I5032 Chronic diastolic (congestive) heart failure: Secondary | ICD-10-CM | POA: Diagnosis not present

## 2017-01-21 DIAGNOSIS — S72002D Fracture of unspecified part of neck of left femur, subsequent encounter for closed fracture with routine healing: Secondary | ICD-10-CM | POA: Diagnosis not present

## 2017-01-21 DIAGNOSIS — N183 Chronic kidney disease, stage 3 (moderate): Secondary | ICD-10-CM | POA: Diagnosis not present

## 2017-01-21 DIAGNOSIS — E119 Type 2 diabetes mellitus without complications: Secondary | ICD-10-CM | POA: Diagnosis not present

## 2017-01-21 DIAGNOSIS — I2510 Atherosclerotic heart disease of native coronary artery without angina pectoris: Secondary | ICD-10-CM | POA: Diagnosis not present

## 2017-01-21 DIAGNOSIS — L89620 Pressure ulcer of left heel, unstageable: Secondary | ICD-10-CM | POA: Diagnosis not present

## 2017-01-21 DIAGNOSIS — E1151 Type 2 diabetes mellitus with diabetic peripheral angiopathy without gangrene: Secondary | ICD-10-CM | POA: Diagnosis not present

## 2017-01-21 DIAGNOSIS — J449 Chronic obstructive pulmonary disease, unspecified: Secondary | ICD-10-CM | POA: Diagnosis not present

## 2017-01-21 DIAGNOSIS — Z794 Long term (current) use of insulin: Secondary | ICD-10-CM | POA: Diagnosis not present

## 2017-01-21 DIAGNOSIS — Z466 Encounter for fitting and adjustment of urinary device: Secondary | ICD-10-CM | POA: Diagnosis not present

## 2017-01-21 DIAGNOSIS — R1909 Other intra-abdominal and pelvic swelling, mass and lump: Secondary | ICD-10-CM | POA: Diagnosis not present

## 2017-01-21 DIAGNOSIS — Z96642 Presence of left artificial hip joint: Secondary | ICD-10-CM | POA: Diagnosis not present

## 2017-01-21 DIAGNOSIS — E1122 Type 2 diabetes mellitus with diabetic chronic kidney disease: Secondary | ICD-10-CM | POA: Diagnosis not present

## 2017-01-22 ENCOUNTER — Ambulatory Visit: Payer: Medicare Other | Admitting: Family Medicine

## 2017-01-23 DIAGNOSIS — Z794 Long term (current) use of insulin: Secondary | ICD-10-CM | POA: Diagnosis not present

## 2017-01-23 DIAGNOSIS — L89620 Pressure ulcer of left heel, unstageable: Secondary | ICD-10-CM | POA: Diagnosis not present

## 2017-01-23 DIAGNOSIS — E119 Type 2 diabetes mellitus without complications: Secondary | ICD-10-CM | POA: Diagnosis not present

## 2017-01-23 DIAGNOSIS — Z8551 Personal history of malignant neoplasm of bladder: Secondary | ICD-10-CM | POA: Diagnosis not present

## 2017-01-23 DIAGNOSIS — R3914 Feeling of incomplete bladder emptying: Secondary | ICD-10-CM | POA: Diagnosis not present

## 2017-01-24 DIAGNOSIS — E1151 Type 2 diabetes mellitus with diabetic peripheral angiopathy without gangrene: Secondary | ICD-10-CM | POA: Diagnosis not present

## 2017-01-24 DIAGNOSIS — I2510 Atherosclerotic heart disease of native coronary artery without angina pectoris: Secondary | ICD-10-CM | POA: Diagnosis not present

## 2017-01-24 DIAGNOSIS — S72002D Fracture of unspecified part of neck of left femur, subsequent encounter for closed fracture with routine healing: Secondary | ICD-10-CM | POA: Diagnosis not present

## 2017-01-24 DIAGNOSIS — I5032 Chronic diastolic (congestive) heart failure: Secondary | ICD-10-CM | POA: Diagnosis not present

## 2017-01-24 DIAGNOSIS — J449 Chronic obstructive pulmonary disease, unspecified: Secondary | ICD-10-CM | POA: Diagnosis not present

## 2017-01-24 DIAGNOSIS — I130 Hypertensive heart and chronic kidney disease with heart failure and stage 1 through stage 4 chronic kidney disease, or unspecified chronic kidney disease: Secondary | ICD-10-CM | POA: Diagnosis not present

## 2017-01-24 DIAGNOSIS — Z96642 Presence of left artificial hip joint: Secondary | ICD-10-CM | POA: Diagnosis not present

## 2017-01-24 DIAGNOSIS — Z466 Encounter for fitting and adjustment of urinary device: Secondary | ICD-10-CM | POA: Diagnosis not present

## 2017-01-24 DIAGNOSIS — M7989 Other specified soft tissue disorders: Secondary | ICD-10-CM | POA: Diagnosis not present

## 2017-01-24 DIAGNOSIS — N183 Chronic kidney disease, stage 3 (moderate): Secondary | ICD-10-CM | POA: Diagnosis not present

## 2017-01-24 DIAGNOSIS — L89620 Pressure ulcer of left heel, unstageable: Secondary | ICD-10-CM | POA: Diagnosis not present

## 2017-01-24 DIAGNOSIS — E1122 Type 2 diabetes mellitus with diabetic chronic kidney disease: Secondary | ICD-10-CM | POA: Diagnosis not present

## 2017-01-24 DIAGNOSIS — R1909 Other intra-abdominal and pelvic swelling, mass and lump: Secondary | ICD-10-CM | POA: Diagnosis not present

## 2017-01-27 ENCOUNTER — Encounter: Payer: Self-pay | Admitting: Family Medicine

## 2017-01-27 DIAGNOSIS — E1122 Type 2 diabetes mellitus with diabetic chronic kidney disease: Secondary | ICD-10-CM | POA: Diagnosis not present

## 2017-01-27 DIAGNOSIS — I5032 Chronic diastolic (congestive) heart failure: Secondary | ICD-10-CM | POA: Diagnosis not present

## 2017-01-27 DIAGNOSIS — L89620 Pressure ulcer of left heel, unstageable: Secondary | ICD-10-CM | POA: Diagnosis not present

## 2017-01-27 DIAGNOSIS — I130 Hypertensive heart and chronic kidney disease with heart failure and stage 1 through stage 4 chronic kidney disease, or unspecified chronic kidney disease: Secondary | ICD-10-CM | POA: Diagnosis not present

## 2017-01-27 DIAGNOSIS — M7989 Other specified soft tissue disorders: Secondary | ICD-10-CM | POA: Diagnosis not present

## 2017-01-27 DIAGNOSIS — I2510 Atherosclerotic heart disease of native coronary artery without angina pectoris: Secondary | ICD-10-CM | POA: Diagnosis not present

## 2017-01-27 DIAGNOSIS — E1151 Type 2 diabetes mellitus with diabetic peripheral angiopathy without gangrene: Secondary | ICD-10-CM | POA: Diagnosis not present

## 2017-01-27 DIAGNOSIS — J449 Chronic obstructive pulmonary disease, unspecified: Secondary | ICD-10-CM | POA: Diagnosis not present

## 2017-01-27 DIAGNOSIS — Z466 Encounter for fitting and adjustment of urinary device: Secondary | ICD-10-CM | POA: Diagnosis not present

## 2017-01-27 DIAGNOSIS — R1909 Other intra-abdominal and pelvic swelling, mass and lump: Secondary | ICD-10-CM | POA: Diagnosis not present

## 2017-01-27 DIAGNOSIS — S72002D Fracture of unspecified part of neck of left femur, subsequent encounter for closed fracture with routine healing: Secondary | ICD-10-CM | POA: Diagnosis not present

## 2017-01-27 DIAGNOSIS — N183 Chronic kidney disease, stage 3 (moderate): Secondary | ICD-10-CM | POA: Diagnosis not present

## 2017-01-27 DIAGNOSIS — Z96642 Presence of left artificial hip joint: Secondary | ICD-10-CM | POA: Diagnosis not present

## 2017-01-29 DIAGNOSIS — R1909 Other intra-abdominal and pelvic swelling, mass and lump: Secondary | ICD-10-CM | POA: Diagnosis not present

## 2017-01-29 DIAGNOSIS — E1122 Type 2 diabetes mellitus with diabetic chronic kidney disease: Secondary | ICD-10-CM | POA: Diagnosis not present

## 2017-01-29 DIAGNOSIS — I5032 Chronic diastolic (congestive) heart failure: Secondary | ICD-10-CM | POA: Diagnosis not present

## 2017-01-29 DIAGNOSIS — Z96642 Presence of left artificial hip joint: Secondary | ICD-10-CM | POA: Diagnosis not present

## 2017-01-29 DIAGNOSIS — M7989 Other specified soft tissue disorders: Secondary | ICD-10-CM | POA: Diagnosis not present

## 2017-01-29 DIAGNOSIS — N183 Chronic kidney disease, stage 3 (moderate): Secondary | ICD-10-CM | POA: Diagnosis not present

## 2017-01-29 DIAGNOSIS — Z466 Encounter for fitting and adjustment of urinary device: Secondary | ICD-10-CM | POA: Diagnosis not present

## 2017-01-29 DIAGNOSIS — J449 Chronic obstructive pulmonary disease, unspecified: Secondary | ICD-10-CM | POA: Diagnosis not present

## 2017-01-29 DIAGNOSIS — I130 Hypertensive heart and chronic kidney disease with heart failure and stage 1 through stage 4 chronic kidney disease, or unspecified chronic kidney disease: Secondary | ICD-10-CM | POA: Diagnosis not present

## 2017-01-29 DIAGNOSIS — S72002D Fracture of unspecified part of neck of left femur, subsequent encounter for closed fracture with routine healing: Secondary | ICD-10-CM | POA: Diagnosis not present

## 2017-01-29 DIAGNOSIS — I2510 Atherosclerotic heart disease of native coronary artery without angina pectoris: Secondary | ICD-10-CM | POA: Diagnosis not present

## 2017-01-29 DIAGNOSIS — E1151 Type 2 diabetes mellitus with diabetic peripheral angiopathy without gangrene: Secondary | ICD-10-CM | POA: Diagnosis not present

## 2017-01-29 DIAGNOSIS — L89620 Pressure ulcer of left heel, unstageable: Secondary | ICD-10-CM | POA: Diagnosis not present

## 2017-01-30 ENCOUNTER — Encounter: Payer: Self-pay | Admitting: Family Medicine

## 2017-01-30 ENCOUNTER — Ambulatory Visit (INDEPENDENT_AMBULATORY_CARE_PROVIDER_SITE_OTHER): Payer: Medicare Other | Admitting: Family Medicine

## 2017-01-30 VITALS — BP 116/68 | HR 82 | Temp 97.6°F

## 2017-01-30 DIAGNOSIS — F39 Unspecified mood [affective] disorder: Secondary | ICD-10-CM

## 2017-01-30 DIAGNOSIS — C679 Malignant neoplasm of bladder, unspecified: Secondary | ICD-10-CM

## 2017-01-30 DIAGNOSIS — E1169 Type 2 diabetes mellitus with other specified complication: Secondary | ICD-10-CM | POA: Diagnosis not present

## 2017-01-30 DIAGNOSIS — D649 Anemia, unspecified: Secondary | ICD-10-CM | POA: Diagnosis not present

## 2017-01-30 DIAGNOSIS — R9389 Abnormal findings on diagnostic imaging of other specified body structures: Secondary | ICD-10-CM

## 2017-01-30 DIAGNOSIS — L98491 Non-pressure chronic ulcer of skin of other sites limited to breakdown of skin: Secondary | ICD-10-CM

## 2017-01-30 DIAGNOSIS — Z8781 Personal history of (healed) traumatic fracture: Secondary | ICD-10-CM | POA: Diagnosis not present

## 2017-01-30 DIAGNOSIS — H6120 Impacted cerumen, unspecified ear: Secondary | ICD-10-CM

## 2017-01-30 DIAGNOSIS — Z794 Long term (current) use of insulin: Secondary | ICD-10-CM

## 2017-01-30 DIAGNOSIS — E1142 Type 2 diabetes mellitus with diabetic polyneuropathy: Secondary | ICD-10-CM | POA: Diagnosis not present

## 2017-01-30 DIAGNOSIS — R938 Abnormal findings on diagnostic imaging of other specified body structures: Secondary | ICD-10-CM | POA: Diagnosis not present

## 2017-01-30 DIAGNOSIS — R609 Edema, unspecified: Secondary | ICD-10-CM

## 2017-01-30 LAB — CBC WITH DIFFERENTIAL/PLATELET
Basophils Absolute: 0.1 10*3/uL (ref 0.0–0.1)
Basophils Relative: 0.8 % (ref 0.0–3.0)
Eosinophils Absolute: 0.2 10*3/uL (ref 0.0–0.7)
Eosinophils Relative: 2.2 % (ref 0.0–5.0)
HCT: 36.5 % — ABNORMAL LOW (ref 39.0–52.0)
Hemoglobin: 12.1 g/dL — ABNORMAL LOW (ref 13.0–17.0)
Lymphocytes Relative: 9.9 % — ABNORMAL LOW (ref 12.0–46.0)
Lymphs Abs: 1.1 10*3/uL (ref 0.7–4.0)
MCHC: 33.2 g/dL (ref 30.0–36.0)
MCV: 96.3 fl (ref 78.0–100.0)
Monocytes Absolute: 1.2 10*3/uL — ABNORMAL HIGH (ref 0.1–1.0)
Monocytes Relative: 10.8 % (ref 3.0–12.0)
Neutro Abs: 8.5 10*3/uL — ABNORMAL HIGH (ref 1.4–7.7)
Neutrophils Relative %: 76.3 % (ref 43.0–77.0)
Platelets: 342 10*3/uL (ref 150.0–400.0)
RBC: 3.79 Mil/uL — ABNORMAL LOW (ref 4.22–5.81)
RDW: 14.1 % (ref 11.5–15.5)
WBC: 11.1 10*3/uL — ABNORMAL HIGH (ref 4.0–10.5)

## 2017-01-30 LAB — COMPREHENSIVE METABOLIC PANEL
ALT: 8 U/L (ref 0–53)
AST: 12 U/L (ref 0–37)
Albumin: 3.9 g/dL (ref 3.5–5.2)
Alkaline Phosphatase: 56 U/L (ref 39–117)
BUN: 38 mg/dL — ABNORMAL HIGH (ref 6–23)
CO2: 29 mEq/L (ref 19–32)
Calcium: 9.4 mg/dL (ref 8.4–10.5)
Chloride: 100 mEq/L (ref 96–112)
Creatinine, Ser: 1.6 mg/dL — ABNORMAL HIGH (ref 0.40–1.50)
GFR: 43.75 mL/min — ABNORMAL LOW (ref >60.00)
Glucose, Bld: 121 mg/dL — ABNORMAL HIGH (ref 70–99)
Potassium: 4.9 mEq/L (ref 3.5–5.1)
Sodium: 136 mEq/L (ref 135–145)
Total Bilirubin: 0.4 mg/dL (ref 0.2–1.2)
Total Protein: 7.2 g/dL (ref 6.0–8.3)

## 2017-01-30 LAB — HEMOGLOBIN A1C: Hgb A1c MFr Bld: 6.6 % — ABNORMAL HIGH (ref 4.6–6.5)

## 2017-01-30 MED ORDER — OXYCODONE HCL 15 MG PO TABS: 7.5000 mg | ORAL_TABLET | Freq: Three times a day (TID) | ORAL | 0 refills | Status: DC | PRN

## 2017-01-30 NOTE — Progress Notes (Signed)
Pre visit review using our clinic review tool, if applicable. No additional management support is needed unless otherwise documented below in the visit note. 

## 2017-01-30 NOTE — Progress Notes (Signed)
He wanted second opinion for urology re: mass on MRI.  He still has slightly slower stream with some episodic burning with urination.  Still on abx at this point.   Referral placed.  We talked about sending him home with a sterile urine container to collect a sample if he has return of urinary symptoms or worsening of urinary symptoms when he finishes his Macrodantin.  Diabetes. Sugar has been variable. This seems to be related to schedule upheaval and schedule adjustment. He is adjusting from the move at skilled nursing back to home. He previously had better control of his sugar at home with current regimen listed. He will continue to check his sugar and update me if he has significant changes.  Anemia. Likely a postop issue. Discussed with patient. Due for recheck today. See notes on labs.  Pain. Using oxycodone less than previous. Using only as needed. Status post hip surgery. Pain is reasonably well controlled with when necessary use of medication. No adverse effect on medication  Functional status in general discussed with patient. He has home health PT coming-out starting tomorrow. He is in a wheelchair today but is trying to get up with a walker. He did have a fall last week. Discussed with him about fall cautions.  Buttock irritation. Slowly improving. No ulceration. This should improve as he is up and weightbearing more. The last time he is sitting more his skin should improve. He is using an extra cushion when he sits.  He has had some left earwax that he needs removed.  He has had some episodic swelling in the left foot. He wanted this checked today.  PMH and SH reviewed  ROS: Per HPI unless specifically indicated in ROS section   Meds, vitals, and allergies reviewed.   GEN: nad, alert and oriented HEENT: mucous membranes moist, earwax in left ear canal removed with curet without complication and he can hear better after the fact. Recheck canal normal. NECK: supple w/o LA CV:  IRR, not tachy PULM: ctab, no inc wob ABD: soft, +bs EXT: no edema SKIN: no acute rash but chronic irritation without ulceration noted on the buttocks. Left foot with transmetatarsal amputation noted. This has healed well. Minimal almost zero swelling in the foot today.

## 2017-01-30 NOTE — Patient Instructions (Addendum)
[name] Small will call about your referral. Go to the lab on the way out.  We'll contact you with your lab report. If you have more urinary symptoms after stopping the antibiotics, then collect a sterile urine sample and update me.   Take care.  Glad to see you.

## 2017-01-31 ENCOUNTER — Telehealth (INDEPENDENT_AMBULATORY_CARE_PROVIDER_SITE_OTHER): Payer: Self-pay | Admitting: Physician Assistant

## 2017-01-31 ENCOUNTER — Telehealth: Payer: Self-pay

## 2017-01-31 DIAGNOSIS — E1151 Type 2 diabetes mellitus with diabetic peripheral angiopathy without gangrene: Secondary | ICD-10-CM | POA: Diagnosis not present

## 2017-01-31 DIAGNOSIS — Z96642 Presence of left artificial hip joint: Secondary | ICD-10-CM | POA: Diagnosis not present

## 2017-01-31 DIAGNOSIS — D649 Anemia, unspecified: Secondary | ICD-10-CM | POA: Insufficient documentation

## 2017-01-31 DIAGNOSIS — N183 Chronic kidney disease, stage 3 (moderate): Secondary | ICD-10-CM | POA: Diagnosis not present

## 2017-01-31 DIAGNOSIS — R1909 Other intra-abdominal and pelvic swelling, mass and lump: Secondary | ICD-10-CM | POA: Diagnosis not present

## 2017-01-31 DIAGNOSIS — S72002D Fracture of unspecified part of neck of left femur, subsequent encounter for closed fracture with routine healing: Secondary | ICD-10-CM | POA: Diagnosis not present

## 2017-01-31 DIAGNOSIS — I5032 Chronic diastolic (congestive) heart failure: Secondary | ICD-10-CM | POA: Diagnosis not present

## 2017-01-31 DIAGNOSIS — Z466 Encounter for fitting and adjustment of urinary device: Secondary | ICD-10-CM | POA: Diagnosis not present

## 2017-01-31 DIAGNOSIS — I130 Hypertensive heart and chronic kidney disease with heart failure and stage 1 through stage 4 chronic kidney disease, or unspecified chronic kidney disease: Secondary | ICD-10-CM | POA: Diagnosis not present

## 2017-01-31 DIAGNOSIS — I2510 Atherosclerotic heart disease of native coronary artery without angina pectoris: Secondary | ICD-10-CM | POA: Diagnosis not present

## 2017-01-31 DIAGNOSIS — L89620 Pressure ulcer of left heel, unstageable: Secondary | ICD-10-CM | POA: Diagnosis not present

## 2017-01-31 DIAGNOSIS — M7989 Other specified soft tissue disorders: Secondary | ICD-10-CM | POA: Diagnosis not present

## 2017-01-31 DIAGNOSIS — E1122 Type 2 diabetes mellitus with diabetic chronic kidney disease: Secondary | ICD-10-CM | POA: Diagnosis not present

## 2017-01-31 DIAGNOSIS — J449 Chronic obstructive pulmonary disease, unspecified: Secondary | ICD-10-CM | POA: Diagnosis not present

## 2017-01-31 NOTE — Telephone Encounter (Signed)
[name] Small with [facility] at [facility] left v/m requesting verbal orders to discontinue dressing on lt foot and verify nothing needs to be done to sacrum.

## 2017-01-31 NOTE — Assessment & Plan Note (Signed)
History of. Abnormal MRI noted. He wanted second opinion from urology. Refer. See above about sending patient home with sterile urine container in case he has more dysuria. Currently on Macrodantin.

## 2017-01-31 NOTE — Assessment & Plan Note (Signed)
See notes on labs. I expect his sugars to improve and level out now he is back home, on his regular diet, on his baseline insulin regimen. He will update me as needed.

## 2017-01-31 NOTE — Assessment & Plan Note (Signed)
Discussed with patient at office visit. He has been through a profound amount of upheaval. He is trying to adjust to the changes. He has family support. I expect that as he gets little more visit independent his back his mood should improve. Discussed with patient.

## 2017-01-31 NOTE — Assessment & Plan Note (Signed)
Minimal. Continue current medications. Monitor. Update me as needed.

## 2017-01-31 NOTE — Assessment & Plan Note (Signed)
Resolved with curet.

## 2017-01-31 NOTE — Assessment & Plan Note (Signed)
This is resolved now, only with superficial skin irritation on the buttocks. I think this will improve the more he is up and moving. Discussed with patient. Continue topical care as needed.

## 2017-01-31 NOTE — Assessment & Plan Note (Signed)
Status post repair. Has physical therapy pending. Using oxycodone only as needed. Overall improving.

## 2017-01-31 NOTE — Assessment & Plan Note (Addendum)
See notes on labs.  >25 minutes spent in face to face time with patient, >50% spent in counselling or coordination of care.  

## 2017-01-31 NOTE — Telephone Encounter (Signed)
Reviewed latest note. For buttock healing sore recommend barrier cream and frequent movement.  As far as Lt foot amputation seemed to have healed. Ok to d/c dressings.  Will cc PCP as well

## 2017-01-31 NOTE — Telephone Encounter (Signed)
Wes from [facility] called stating he needs verbal orders for physical therapy. CB#[phone].  Thank you.  2x for 3 weeks

## 2017-02-01 NOTE — Telephone Encounter (Signed)
Agreed, thanks

## 2017-02-03 DIAGNOSIS — Z96642 Presence of left artificial hip joint: Secondary | ICD-10-CM | POA: Diagnosis not present

## 2017-02-03 DIAGNOSIS — I2510 Atherosclerotic heart disease of native coronary artery without angina pectoris: Secondary | ICD-10-CM | POA: Diagnosis not present

## 2017-02-03 DIAGNOSIS — N183 Chronic kidney disease, stage 3 (moderate): Secondary | ICD-10-CM | POA: Diagnosis not present

## 2017-02-03 DIAGNOSIS — E1122 Type 2 diabetes mellitus with diabetic chronic kidney disease: Secondary | ICD-10-CM | POA: Diagnosis not present

## 2017-02-03 DIAGNOSIS — E1151 Type 2 diabetes mellitus with diabetic peripheral angiopathy without gangrene: Secondary | ICD-10-CM | POA: Diagnosis not present

## 2017-02-03 DIAGNOSIS — L89620 Pressure ulcer of left heel, unstageable: Secondary | ICD-10-CM | POA: Diagnosis not present

## 2017-02-03 DIAGNOSIS — I130 Hypertensive heart and chronic kidney disease with heart failure and stage 1 through stage 4 chronic kidney disease, or unspecified chronic kidney disease: Secondary | ICD-10-CM | POA: Diagnosis not present

## 2017-02-03 DIAGNOSIS — Z466 Encounter for fitting and adjustment of urinary device: Secondary | ICD-10-CM | POA: Diagnosis not present

## 2017-02-03 DIAGNOSIS — R1909 Other intra-abdominal and pelvic swelling, mass and lump: Secondary | ICD-10-CM | POA: Diagnosis not present

## 2017-02-03 DIAGNOSIS — S72002D Fracture of unspecified part of neck of left femur, subsequent encounter for closed fracture with routine healing: Secondary | ICD-10-CM | POA: Diagnosis not present

## 2017-02-03 DIAGNOSIS — M7989 Other specified soft tissue disorders: Secondary | ICD-10-CM | POA: Diagnosis not present

## 2017-02-03 DIAGNOSIS — I5032 Chronic diastolic (congestive) heart failure: Secondary | ICD-10-CM | POA: Diagnosis not present

## 2017-02-03 DIAGNOSIS — J449 Chronic obstructive pulmonary disease, unspecified: Secondary | ICD-10-CM | POA: Diagnosis not present

## 2017-02-03 NOTE — Telephone Encounter (Signed)
Claiborne Billings returned your call. She is requesting a cb. Can leave vm, [phone]

## 2017-02-03 NOTE — Telephone Encounter (Signed)
Claiborne Billings notified as instructed by telephone and verbalized understanding.

## 2017-02-03 NOTE — Telephone Encounter (Signed)
Verbal left on VM 

## 2017-02-03 NOTE — Telephone Encounter (Signed)
Left message on voicemail for Claiborne Billings to call back.

## 2017-02-04 DIAGNOSIS — M7989 Other specified soft tissue disorders: Secondary | ICD-10-CM | POA: Diagnosis not present

## 2017-02-04 DIAGNOSIS — J449 Chronic obstructive pulmonary disease, unspecified: Secondary | ICD-10-CM | POA: Diagnosis not present

## 2017-02-04 DIAGNOSIS — Z466 Encounter for fitting and adjustment of urinary device: Secondary | ICD-10-CM | POA: Diagnosis not present

## 2017-02-04 DIAGNOSIS — N183 Chronic kidney disease, stage 3 (moderate): Secondary | ICD-10-CM | POA: Diagnosis not present

## 2017-02-04 DIAGNOSIS — R1909 Other intra-abdominal and pelvic swelling, mass and lump: Secondary | ICD-10-CM | POA: Diagnosis not present

## 2017-02-04 DIAGNOSIS — I5032 Chronic diastolic (congestive) heart failure: Secondary | ICD-10-CM | POA: Diagnosis not present

## 2017-02-04 DIAGNOSIS — E1151 Type 2 diabetes mellitus with diabetic peripheral angiopathy without gangrene: Secondary | ICD-10-CM | POA: Diagnosis not present

## 2017-02-04 DIAGNOSIS — Z96642 Presence of left artificial hip joint: Secondary | ICD-10-CM | POA: Diagnosis not present

## 2017-02-04 DIAGNOSIS — L89620 Pressure ulcer of left heel, unstageable: Secondary | ICD-10-CM | POA: Diagnosis not present

## 2017-02-04 DIAGNOSIS — I130 Hypertensive heart and chronic kidney disease with heart failure and stage 1 through stage 4 chronic kidney disease, or unspecified chronic kidney disease: Secondary | ICD-10-CM | POA: Diagnosis not present

## 2017-02-04 DIAGNOSIS — I2510 Atherosclerotic heart disease of native coronary artery without angina pectoris: Secondary | ICD-10-CM | POA: Diagnosis not present

## 2017-02-04 DIAGNOSIS — S72002D Fracture of unspecified part of neck of left femur, subsequent encounter for closed fracture with routine healing: Secondary | ICD-10-CM | POA: Diagnosis not present

## 2017-02-04 DIAGNOSIS — E1122 Type 2 diabetes mellitus with diabetic chronic kidney disease: Secondary | ICD-10-CM | POA: Diagnosis not present

## 2017-02-05 DIAGNOSIS — J449 Chronic obstructive pulmonary disease, unspecified: Secondary | ICD-10-CM | POA: Diagnosis not present

## 2017-02-05 DIAGNOSIS — M7989 Other specified soft tissue disorders: Secondary | ICD-10-CM | POA: Diagnosis not present

## 2017-02-05 DIAGNOSIS — R1909 Other intra-abdominal and pelvic swelling, mass and lump: Secondary | ICD-10-CM | POA: Diagnosis not present

## 2017-02-05 DIAGNOSIS — I130 Hypertensive heart and chronic kidney disease with heart failure and stage 1 through stage 4 chronic kidney disease, or unspecified chronic kidney disease: Secondary | ICD-10-CM | POA: Diagnosis not present

## 2017-02-05 DIAGNOSIS — L89620 Pressure ulcer of left heel, unstageable: Secondary | ICD-10-CM | POA: Diagnosis not present

## 2017-02-05 DIAGNOSIS — Z466 Encounter for fitting and adjustment of urinary device: Secondary | ICD-10-CM | POA: Diagnosis not present

## 2017-02-05 DIAGNOSIS — N183 Chronic kidney disease, stage 3 (moderate): Secondary | ICD-10-CM | POA: Diagnosis not present

## 2017-02-05 DIAGNOSIS — E1122 Type 2 diabetes mellitus with diabetic chronic kidney disease: Secondary | ICD-10-CM | POA: Diagnosis not present

## 2017-02-05 DIAGNOSIS — E1151 Type 2 diabetes mellitus with diabetic peripheral angiopathy without gangrene: Secondary | ICD-10-CM | POA: Diagnosis not present

## 2017-02-05 DIAGNOSIS — I5032 Chronic diastolic (congestive) heart failure: Secondary | ICD-10-CM | POA: Diagnosis not present

## 2017-02-05 DIAGNOSIS — S72002D Fracture of unspecified part of neck of left femur, subsequent encounter for closed fracture with routine healing: Secondary | ICD-10-CM | POA: Diagnosis not present

## 2017-02-05 DIAGNOSIS — I2510 Atherosclerotic heart disease of native coronary artery without angina pectoris: Secondary | ICD-10-CM | POA: Diagnosis not present

## 2017-02-05 DIAGNOSIS — Z96642 Presence of left artificial hip joint: Secondary | ICD-10-CM | POA: Diagnosis not present

## 2017-02-07 DIAGNOSIS — J449 Chronic obstructive pulmonary disease, unspecified: Secondary | ICD-10-CM | POA: Diagnosis not present

## 2017-02-07 DIAGNOSIS — N183 Chronic kidney disease, stage 3 (moderate): Secondary | ICD-10-CM | POA: Diagnosis not present

## 2017-02-07 DIAGNOSIS — E1151 Type 2 diabetes mellitus with diabetic peripheral angiopathy without gangrene: Secondary | ICD-10-CM | POA: Diagnosis not present

## 2017-02-07 DIAGNOSIS — M7989 Other specified soft tissue disorders: Secondary | ICD-10-CM | POA: Diagnosis not present

## 2017-02-07 DIAGNOSIS — R1909 Other intra-abdominal and pelvic swelling, mass and lump: Secondary | ICD-10-CM | POA: Diagnosis not present

## 2017-02-07 DIAGNOSIS — I509 Heart failure, unspecified: Secondary | ICD-10-CM | POA: Diagnosis not present

## 2017-02-07 DIAGNOSIS — Z466 Encounter for fitting and adjustment of urinary device: Secondary | ICD-10-CM | POA: Diagnosis not present

## 2017-02-07 DIAGNOSIS — E1122 Type 2 diabetes mellitus with diabetic chronic kidney disease: Secondary | ICD-10-CM | POA: Diagnosis not present

## 2017-02-07 DIAGNOSIS — I130 Hypertensive heart and chronic kidney disease with heart failure and stage 1 through stage 4 chronic kidney disease, or unspecified chronic kidney disease: Secondary | ICD-10-CM | POA: Diagnosis not present

## 2017-02-07 DIAGNOSIS — I2510 Atherosclerotic heart disease of native coronary artery without angina pectoris: Secondary | ICD-10-CM | POA: Diagnosis not present

## 2017-02-07 DIAGNOSIS — L89620 Pressure ulcer of left heel, unstageable: Secondary | ICD-10-CM | POA: Diagnosis not present

## 2017-02-07 DIAGNOSIS — S72002D Fracture of unspecified part of neck of left femur, subsequent encounter for closed fracture with routine healing: Secondary | ICD-10-CM | POA: Diagnosis not present

## 2017-02-07 DIAGNOSIS — I5032 Chronic diastolic (congestive) heart failure: Secondary | ICD-10-CM | POA: Diagnosis not present

## 2017-02-07 DIAGNOSIS — Z96642 Presence of left artificial hip joint: Secondary | ICD-10-CM | POA: Diagnosis not present

## 2017-02-10 DIAGNOSIS — I130 Hypertensive heart and chronic kidney disease with heart failure and stage 1 through stage 4 chronic kidney disease, or unspecified chronic kidney disease: Secondary | ICD-10-CM | POA: Diagnosis not present

## 2017-02-10 DIAGNOSIS — M7989 Other specified soft tissue disorders: Secondary | ICD-10-CM | POA: Diagnosis not present

## 2017-02-10 DIAGNOSIS — L89620 Pressure ulcer of left heel, unstageable: Secondary | ICD-10-CM | POA: Diagnosis not present

## 2017-02-10 DIAGNOSIS — J449 Chronic obstructive pulmonary disease, unspecified: Secondary | ICD-10-CM | POA: Diagnosis not present

## 2017-02-10 DIAGNOSIS — N183 Chronic kidney disease, stage 3 (moderate): Secondary | ICD-10-CM | POA: Diagnosis not present

## 2017-02-10 DIAGNOSIS — I2510 Atherosclerotic heart disease of native coronary artery without angina pectoris: Secondary | ICD-10-CM | POA: Diagnosis not present

## 2017-02-10 DIAGNOSIS — E1151 Type 2 diabetes mellitus with diabetic peripheral angiopathy without gangrene: Secondary | ICD-10-CM | POA: Diagnosis not present

## 2017-02-10 DIAGNOSIS — R1909 Other intra-abdominal and pelvic swelling, mass and lump: Secondary | ICD-10-CM | POA: Diagnosis not present

## 2017-02-10 DIAGNOSIS — Z96642 Presence of left artificial hip joint: Secondary | ICD-10-CM | POA: Diagnosis not present

## 2017-02-10 DIAGNOSIS — I5032 Chronic diastolic (congestive) heart failure: Secondary | ICD-10-CM | POA: Diagnosis not present

## 2017-02-10 DIAGNOSIS — Z466 Encounter for fitting and adjustment of urinary device: Secondary | ICD-10-CM | POA: Diagnosis not present

## 2017-02-10 DIAGNOSIS — S72002D Fracture of unspecified part of neck of left femur, subsequent encounter for closed fracture with routine healing: Secondary | ICD-10-CM | POA: Diagnosis not present

## 2017-02-10 DIAGNOSIS — E1122 Type 2 diabetes mellitus with diabetic chronic kidney disease: Secondary | ICD-10-CM | POA: Diagnosis not present

## 2017-02-11 DIAGNOSIS — Z8551 Personal history of malignant neoplasm of bladder: Secondary | ICD-10-CM | POA: Diagnosis not present

## 2017-02-11 DIAGNOSIS — R339 Retention of urine, unspecified: Secondary | ICD-10-CM | POA: Diagnosis not present

## 2017-02-12 DIAGNOSIS — I5032 Chronic diastolic (congestive) heart failure: Secondary | ICD-10-CM | POA: Diagnosis not present

## 2017-02-12 DIAGNOSIS — Z466 Encounter for fitting and adjustment of urinary device: Secondary | ICD-10-CM | POA: Diagnosis not present

## 2017-02-12 DIAGNOSIS — M7989 Other specified soft tissue disorders: Secondary | ICD-10-CM | POA: Diagnosis not present

## 2017-02-12 DIAGNOSIS — I2510 Atherosclerotic heart disease of native coronary artery without angina pectoris: Secondary | ICD-10-CM | POA: Diagnosis not present

## 2017-02-12 DIAGNOSIS — R1909 Other intra-abdominal and pelvic swelling, mass and lump: Secondary | ICD-10-CM | POA: Diagnosis not present

## 2017-02-12 DIAGNOSIS — J449 Chronic obstructive pulmonary disease, unspecified: Secondary | ICD-10-CM | POA: Diagnosis not present

## 2017-02-12 DIAGNOSIS — E1151 Type 2 diabetes mellitus with diabetic peripheral angiopathy without gangrene: Secondary | ICD-10-CM | POA: Diagnosis not present

## 2017-02-12 DIAGNOSIS — E1122 Type 2 diabetes mellitus with diabetic chronic kidney disease: Secondary | ICD-10-CM | POA: Diagnosis not present

## 2017-02-12 DIAGNOSIS — I130 Hypertensive heart and chronic kidney disease with heart failure and stage 1 through stage 4 chronic kidney disease, or unspecified chronic kidney disease: Secondary | ICD-10-CM | POA: Diagnosis not present

## 2017-02-12 DIAGNOSIS — S72002D Fracture of unspecified part of neck of left femur, subsequent encounter for closed fracture with routine healing: Secondary | ICD-10-CM | POA: Diagnosis not present

## 2017-02-12 DIAGNOSIS — N183 Chronic kidney disease, stage 3 (moderate): Secondary | ICD-10-CM | POA: Diagnosis not present

## 2017-02-12 DIAGNOSIS — Z96642 Presence of left artificial hip joint: Secondary | ICD-10-CM | POA: Diagnosis not present

## 2017-02-12 DIAGNOSIS — L89620 Pressure ulcer of left heel, unstageable: Secondary | ICD-10-CM | POA: Diagnosis not present

## 2017-02-14 DIAGNOSIS — R1909 Other intra-abdominal and pelvic swelling, mass and lump: Secondary | ICD-10-CM | POA: Diagnosis not present

## 2017-02-14 DIAGNOSIS — L89620 Pressure ulcer of left heel, unstageable: Secondary | ICD-10-CM | POA: Diagnosis not present

## 2017-02-14 DIAGNOSIS — N183 Chronic kidney disease, stage 3 (moderate): Secondary | ICD-10-CM | POA: Diagnosis not present

## 2017-02-14 DIAGNOSIS — I5032 Chronic diastolic (congestive) heart failure: Secondary | ICD-10-CM | POA: Diagnosis not present

## 2017-02-14 DIAGNOSIS — E1122 Type 2 diabetes mellitus with diabetic chronic kidney disease: Secondary | ICD-10-CM | POA: Diagnosis not present

## 2017-02-14 DIAGNOSIS — M7989 Other specified soft tissue disorders: Secondary | ICD-10-CM | POA: Diagnosis not present

## 2017-02-14 DIAGNOSIS — I130 Hypertensive heart and chronic kidney disease with heart failure and stage 1 through stage 4 chronic kidney disease, or unspecified chronic kidney disease: Secondary | ICD-10-CM | POA: Diagnosis not present

## 2017-02-14 DIAGNOSIS — Z96642 Presence of left artificial hip joint: Secondary | ICD-10-CM | POA: Diagnosis not present

## 2017-02-14 DIAGNOSIS — E1151 Type 2 diabetes mellitus with diabetic peripheral angiopathy without gangrene: Secondary | ICD-10-CM | POA: Diagnosis not present

## 2017-02-14 DIAGNOSIS — J449 Chronic obstructive pulmonary disease, unspecified: Secondary | ICD-10-CM | POA: Diagnosis not present

## 2017-02-14 DIAGNOSIS — Z466 Encounter for fitting and adjustment of urinary device: Secondary | ICD-10-CM | POA: Diagnosis not present

## 2017-02-14 DIAGNOSIS — I2510 Atherosclerotic heart disease of native coronary artery without angina pectoris: Secondary | ICD-10-CM | POA: Diagnosis not present

## 2017-02-14 DIAGNOSIS — S72002D Fracture of unspecified part of neck of left femur, subsequent encounter for closed fracture with routine healing: Secondary | ICD-10-CM | POA: Diagnosis not present

## 2017-02-17 ENCOUNTER — Encounter (INDEPENDENT_AMBULATORY_CARE_PROVIDER_SITE_OTHER): Payer: Self-pay | Admitting: Physician Assistant

## 2017-02-17 ENCOUNTER — Ambulatory Visit (INDEPENDENT_AMBULATORY_CARE_PROVIDER_SITE_OTHER): Payer: Medicare Other | Admitting: Physician Assistant

## 2017-02-17 DIAGNOSIS — E1122 Type 2 diabetes mellitus with diabetic chronic kidney disease: Secondary | ICD-10-CM | POA: Diagnosis not present

## 2017-02-17 DIAGNOSIS — I5032 Chronic diastolic (congestive) heart failure: Secondary | ICD-10-CM | POA: Diagnosis not present

## 2017-02-17 DIAGNOSIS — N183 Chronic kidney disease, stage 3 (moderate): Secondary | ICD-10-CM | POA: Diagnosis not present

## 2017-02-17 DIAGNOSIS — Z96642 Presence of left artificial hip joint: Secondary | ICD-10-CM

## 2017-02-17 DIAGNOSIS — M7989 Other specified soft tissue disorders: Secondary | ICD-10-CM | POA: Diagnosis not present

## 2017-02-17 DIAGNOSIS — I2510 Atherosclerotic heart disease of native coronary artery without angina pectoris: Secondary | ICD-10-CM | POA: Diagnosis not present

## 2017-02-17 DIAGNOSIS — J449 Chronic obstructive pulmonary disease, unspecified: Secondary | ICD-10-CM | POA: Diagnosis not present

## 2017-02-17 DIAGNOSIS — S72002D Fracture of unspecified part of neck of left femur, subsequent encounter for closed fracture with routine healing: Secondary | ICD-10-CM | POA: Diagnosis not present

## 2017-02-17 DIAGNOSIS — R200 Anesthesia of skin: Secondary | ICD-10-CM

## 2017-02-17 DIAGNOSIS — I130 Hypertensive heart and chronic kidney disease with heart failure and stage 1 through stage 4 chronic kidney disease, or unspecified chronic kidney disease: Secondary | ICD-10-CM | POA: Diagnosis not present

## 2017-02-17 DIAGNOSIS — R1909 Other intra-abdominal and pelvic swelling, mass and lump: Secondary | ICD-10-CM | POA: Diagnosis not present

## 2017-02-17 DIAGNOSIS — L89620 Pressure ulcer of left heel, unstageable: Secondary | ICD-10-CM | POA: Diagnosis not present

## 2017-02-17 DIAGNOSIS — Z466 Encounter for fitting and adjustment of urinary device: Secondary | ICD-10-CM | POA: Diagnosis not present

## 2017-02-17 DIAGNOSIS — E1151 Type 2 diabetes mellitus with diabetic peripheral angiopathy without gangrene: Secondary | ICD-10-CM | POA: Diagnosis not present

## 2017-02-17 NOTE — Progress Notes (Signed)
[name] Small returns today follow-up of his left total hip arthroplasty. [name] Small fell back in August due to a mechanical fall. He sustained a left-sided subacute femoral neck fracture. He states he's had numbness in his fourth and fifth fingers bilaterally since the fall. In regards to his left hip he states the hip is doing well he has no complaints. He is ambulating use a walker or cane.  Physical exam left hip he has overall good range of motion of the hip without pain. Left calf supple nontender. Able to actively flex the hip. Next outlined  Bilateral hands he has good grip strength. He has subjective numbness entire fourth and fifth fingers bilaterally. Radial pulses are present bilaterally. There is no rashes skin lesions ulcerations of the hands. Has good range of motion cervical spine without pain. Negative Tinel's over the ulnar nerve at the elbow bilaterally. Mild tenderness over the medial epicondyles bilaterally  Assessment and plan: Regards to his hip to continue work on range of motion strengthening. We will see him back in 1 year postop and obtain an AP pelvis and lateral view of the left hip. Of course he and his family know that if there is any questions or concerns regarding the hip he'll follow up sooner. Regards to his hands he is diabetic been diabetic for number of years. We'll obtain EMG nerve conduction studies to rule out cervical radiculopathy. He'll follow up after EMG nerve conduction studies go over results and discuss further treatment. Questions encouraged and answered. This ends dictation on [name] Small.

## 2017-02-18 ENCOUNTER — Telehealth (INDEPENDENT_AMBULATORY_CARE_PROVIDER_SITE_OTHER): Payer: Self-pay | Admitting: Orthopedic Surgery

## 2017-02-18 NOTE — Telephone Encounter (Signed)
Called and lm on vm to give verbal ok for orders below. To call with questions.

## 2017-02-18 NOTE — Telephone Encounter (Signed)
[name] Small (PT) with [facility] called needing verbal orders for HH (PT) 2 wk 4. The number to contact [name] Small is [phone]

## 2017-02-19 DIAGNOSIS — L89620 Pressure ulcer of left heel, unstageable: Secondary | ICD-10-CM | POA: Diagnosis not present

## 2017-02-19 DIAGNOSIS — E1151 Type 2 diabetes mellitus with diabetic peripheral angiopathy without gangrene: Secondary | ICD-10-CM | POA: Diagnosis not present

## 2017-02-19 DIAGNOSIS — I130 Hypertensive heart and chronic kidney disease with heart failure and stage 1 through stage 4 chronic kidney disease, or unspecified chronic kidney disease: Secondary | ICD-10-CM | POA: Diagnosis not present

## 2017-02-19 DIAGNOSIS — I5032 Chronic diastolic (congestive) heart failure: Secondary | ICD-10-CM | POA: Diagnosis not present

## 2017-02-19 DIAGNOSIS — R1909 Other intra-abdominal and pelvic swelling, mass and lump: Secondary | ICD-10-CM | POA: Diagnosis not present

## 2017-02-19 DIAGNOSIS — I2510 Atherosclerotic heart disease of native coronary artery without angina pectoris: Secondary | ICD-10-CM | POA: Diagnosis not present

## 2017-02-19 DIAGNOSIS — J449 Chronic obstructive pulmonary disease, unspecified: Secondary | ICD-10-CM | POA: Diagnosis not present

## 2017-02-19 DIAGNOSIS — Z466 Encounter for fitting and adjustment of urinary device: Secondary | ICD-10-CM | POA: Diagnosis not present

## 2017-02-19 DIAGNOSIS — N183 Chronic kidney disease, stage 3 (moderate): Secondary | ICD-10-CM | POA: Diagnosis not present

## 2017-02-19 DIAGNOSIS — S72002D Fracture of unspecified part of neck of left femur, subsequent encounter for closed fracture with routine healing: Secondary | ICD-10-CM | POA: Diagnosis not present

## 2017-02-19 DIAGNOSIS — Z96642 Presence of left artificial hip joint: Secondary | ICD-10-CM | POA: Diagnosis not present

## 2017-02-19 DIAGNOSIS — M7989 Other specified soft tissue disorders: Secondary | ICD-10-CM | POA: Diagnosis not present

## 2017-02-19 DIAGNOSIS — E1122 Type 2 diabetes mellitus with diabetic chronic kidney disease: Secondary | ICD-10-CM | POA: Diagnosis not present

## 2017-02-26 DIAGNOSIS — I2510 Atherosclerotic heart disease of native coronary artery without angina pectoris: Secondary | ICD-10-CM | POA: Diagnosis not present

## 2017-02-26 DIAGNOSIS — I5032 Chronic diastolic (congestive) heart failure: Secondary | ICD-10-CM | POA: Diagnosis not present

## 2017-02-26 DIAGNOSIS — L89620 Pressure ulcer of left heel, unstageable: Secondary | ICD-10-CM | POA: Diagnosis not present

## 2017-02-26 DIAGNOSIS — Z96642 Presence of left artificial hip joint: Secondary | ICD-10-CM | POA: Diagnosis not present

## 2017-02-26 DIAGNOSIS — E1151 Type 2 diabetes mellitus with diabetic peripheral angiopathy without gangrene: Secondary | ICD-10-CM | POA: Diagnosis not present

## 2017-02-26 DIAGNOSIS — I130 Hypertensive heart and chronic kidney disease with heart failure and stage 1 through stage 4 chronic kidney disease, or unspecified chronic kidney disease: Secondary | ICD-10-CM | POA: Diagnosis not present

## 2017-02-26 DIAGNOSIS — E1122 Type 2 diabetes mellitus with diabetic chronic kidney disease: Secondary | ICD-10-CM | POA: Diagnosis not present

## 2017-02-26 DIAGNOSIS — J449 Chronic obstructive pulmonary disease, unspecified: Secondary | ICD-10-CM | POA: Diagnosis not present

## 2017-02-26 DIAGNOSIS — Z466 Encounter for fitting and adjustment of urinary device: Secondary | ICD-10-CM | POA: Diagnosis not present

## 2017-02-26 DIAGNOSIS — N183 Chronic kidney disease, stage 3 (moderate): Secondary | ICD-10-CM | POA: Diagnosis not present

## 2017-02-26 DIAGNOSIS — R1909 Other intra-abdominal and pelvic swelling, mass and lump: Secondary | ICD-10-CM | POA: Diagnosis not present

## 2017-02-26 DIAGNOSIS — S72002D Fracture of unspecified part of neck of left femur, subsequent encounter for closed fracture with routine healing: Secondary | ICD-10-CM | POA: Diagnosis not present

## 2017-02-26 DIAGNOSIS — M7989 Other specified soft tissue disorders: Secondary | ICD-10-CM | POA: Diagnosis not present

## 2017-02-28 ENCOUNTER — Encounter (INDEPENDENT_AMBULATORY_CARE_PROVIDER_SITE_OTHER): Payer: Self-pay | Admitting: Physical Medicine and Rehabilitation

## 2017-02-28 ENCOUNTER — Ambulatory Visit (INDEPENDENT_AMBULATORY_CARE_PROVIDER_SITE_OTHER): Payer: Medicare Other | Admitting: Physical Medicine and Rehabilitation

## 2017-02-28 ENCOUNTER — Other Ambulatory Visit: Payer: Self-pay | Admitting: Family Medicine

## 2017-02-28 DIAGNOSIS — R202 Paresthesia of skin: Secondary | ICD-10-CM

## 2017-02-28 MED ORDER — OXYCODONE HCL 15 MG PO TABS: 7.5000 mg | ORAL_TABLET | Freq: Three times a day (TID) | ORAL | 0 refills | Status: DC | PRN

## 2017-02-28 NOTE — Progress Notes (Deleted)
Numbness in 4th and 5th fingers bilaterally.

## 2017-02-28 NOTE — Telephone Encounter (Signed)
Electronic refill request. Last office visit:   01/30/17 Last Filled:    40 tablet 0 01/30/2017  Please advise.

## 2017-02-28 NOTE — Telephone Encounter (Signed)
Printed.  How is patient doing?  Please get an update.  Thanks.

## 2017-02-28 NOTE — Telephone Encounter (Signed)
Wife advised.  Rx left at front desk for pick up.  Patient is improving but slowly.  He doesn't get out of the house much and when he does, wife has help to get him in and out.  PT is still working with him and will be until sometime in May.  They are working on him walking with a cane.

## 2017-03-02 NOTE — Telephone Encounter (Signed)
Noted. Thanks.

## 2017-03-03 ENCOUNTER — Telehealth (INDEPENDENT_AMBULATORY_CARE_PROVIDER_SITE_OTHER): Payer: Self-pay | Admitting: Physical Medicine and Rehabilitation

## 2017-03-03 DIAGNOSIS — Z466 Encounter for fitting and adjustment of urinary device: Secondary | ICD-10-CM | POA: Diagnosis not present

## 2017-03-03 DIAGNOSIS — E1122 Type 2 diabetes mellitus with diabetic chronic kidney disease: Secondary | ICD-10-CM | POA: Diagnosis not present

## 2017-03-03 DIAGNOSIS — N183 Chronic kidney disease, stage 3 (moderate): Secondary | ICD-10-CM | POA: Diagnosis not present

## 2017-03-03 DIAGNOSIS — S72002D Fracture of unspecified part of neck of left femur, subsequent encounter for closed fracture with routine healing: Secondary | ICD-10-CM | POA: Diagnosis not present

## 2017-03-03 DIAGNOSIS — L89620 Pressure ulcer of left heel, unstageable: Secondary | ICD-10-CM | POA: Diagnosis not present

## 2017-03-03 DIAGNOSIS — Z96642 Presence of left artificial hip joint: Secondary | ICD-10-CM | POA: Diagnosis not present

## 2017-03-03 DIAGNOSIS — R1909 Other intra-abdominal and pelvic swelling, mass and lump: Secondary | ICD-10-CM | POA: Diagnosis not present

## 2017-03-03 DIAGNOSIS — J449 Chronic obstructive pulmonary disease, unspecified: Secondary | ICD-10-CM | POA: Diagnosis not present

## 2017-03-03 DIAGNOSIS — I130 Hypertensive heart and chronic kidney disease with heart failure and stage 1 through stage 4 chronic kidney disease, or unspecified chronic kidney disease: Secondary | ICD-10-CM | POA: Diagnosis not present

## 2017-03-03 DIAGNOSIS — I2510 Atherosclerotic heart disease of native coronary artery without angina pectoris: Secondary | ICD-10-CM | POA: Diagnosis not present

## 2017-03-03 DIAGNOSIS — M7989 Other specified soft tissue disorders: Secondary | ICD-10-CM | POA: Diagnosis not present

## 2017-03-03 DIAGNOSIS — I5032 Chronic diastolic (congestive) heart failure: Secondary | ICD-10-CM | POA: Diagnosis not present

## 2017-03-03 DIAGNOSIS — E1151 Type 2 diabetes mellitus with diabetic peripheral angiopathy without gangrene: Secondary | ICD-10-CM | POA: Diagnosis not present

## 2017-03-03 NOTE — Progress Notes (Signed)
[name] Small - 83 y.o. male MRN [id number]  Date of birth: 09/29/1928  Office Visit Note: Visit Date: 02/28/2017 PCP: [name] Stain, MD Referred by: [name] Ghent, MD  Subjective: Chief Complaint  Patient presents with  . Right Hand - Numbness  . Left Hand - Numbness   HPI: [name] Small is a pleasant 83 year old gentleman that I last saw in November of last year and completed an epidural injection and facet joint block was lumbar spine. He has since gone to have total hip replacement doing well from that regard. He continues to have numbness tingling and paresthesia in both hands and particularly the fourth and fifth digits. He complains of weakness and loss of dexterity in both hands. He has difficulty with buttons and just can't use his hands very well at all. He has chronic long-term diabetes. His last hemoglobin A1c was 6.6 please had a history of high numbers. He has a history of peripheral neuropathy in the lower extremities. He does feel dysesthesias and burning in the feet at times. He has not had prior electrodiagnostic studies.    ROS Otherwise per HPI.  Assessment & Plan: Visit Diagnoses:  1. Paresthesia of skin     Plan: No additional findings.  Impression: The above electrodiagnostic study is ABNORMAL and reveals evidence of severe peripheral polyneuropathy of the bilateral upper extremities.   here is no significant electrodiagnostic evidence of any other focal nerve entrapment, brachial plexopathy or cervical radiculopathy.   Recommendations: 1.  Follow-up with referring physician. 2.  Continue current management of symptoms, diabetic control, consider occupational therapy for his hands and upper extremities.    Meds & Orders: No orders of the defined types were placed in this encounter.   Orders Placed This Encounter  Procedures  . NCV with EMG (electromyography)    Follow-up: Return for [name] Sams, PA-C for EMG/NCS review.   Procedures: No procedures  performed  EMG & NCV Findings: Evaluation of the left median motor and the right median motor nerves showed prolonged distal onset latency (L6.6, R5.7 ms), reduced amplitude (L2.3, R3.4 mV), and decreased conduction velocity (Elbow-Wrist, L36, R41 m/s).  The left ulnar motor and the right ulnar motor nerves showed prolonged distal onset latency (L4.8, R4.8 ms), reduced amplitude (L0.6, R1.2 mV), decreased conduction velocity (B Elbow-Wrist, L35, R39 m/s), and decreased conduction velocity (A Elbow-B Elbow, L34, R45 m/s).  The left median (across palm) sensory nerve showed no response (Palm), prolonged distal peak latency (6.5 ms), and reduced amplitude (4.4 V).  The right median (across palm) sensory nerve showed prolonged distal peak latency (Wrist, 5.4 ms), reduced amplitude (7.2 V), and prolonged distal peak latency (Palm, 2.1 ms).  The left radial sensory and the right radial sensory nerves showed prolonged distal peak latency (L5.1, R3.9 ms).  The left ulnar sensory nerve showed no response (Wrist).  The right ulnar sensory nerve showed prolonged distal peak latency (5.3 ms), reduced amplitude (8.6 V), and decreased conduction velocity (Wrist-5th Digit, 26 m/s).  Left vs. Right side comparison data for the median motor nerve indicates abnormal L-R latency difference (0.9 ms).  The ulnar motor nerve indicates abnormal L-R amplitude difference (50.0 %).    Needle evaluation of the left first dorsal interosseous and the left abductor pollicis brevis muscles showed decreased insertional activity, widespread spontaneous activity, increased motor unit amplitude, and diminished recruitment.  All remaining muscles (as indicated in the following table) showed no evidence of electrical instability.    Impression: The above  electrodiagnostic study is ABNORMAL and reveals evidence of severe peripheral polyneuropathy of the bilateral upper extremities.   here is no significant electrodiagnostic evidence of any  other focal nerve entrapment, brachial plexopathy or cervical radiculopathy.   Recommendations: 1.  Follow-up with referring physician. 2.  Continue current management of symptoms, diabetic control, consider occupational therapy for his hands and upper extremities.   Nerve Conduction Studies Anti Sensory Summary Table   Stim Site NR Peak (ms) Norm Peak (ms) P-T Amp (V) Norm P-T Amp Site1 Site2 Delta-P (ms) Dist (cm) Vel (m/s) Norm Vel (m/s)  Left Median Acr Palm Anti Sensory (2nd Digit)  30.1C  Wrist    *6.5 <3.6 *4.4 >10 Wrist Palm  0.0    Palm *NR  <2.0          Right Median Acr Palm Anti Sensory (2nd Digit)  30.7C  Wrist    *5.4 <3.6 *7.2 >10 Wrist Palm 3.3 0.0    Palm    *2.1 <2.0 4.6         Left Radial Anti Sensory (Base 1st Digit)  30.5C  Wrist    *5.1 <3.1 3.8  Wrist Base 1st Digit 5.1 0.0    Right Radial Anti Sensory (Base 1st Digit)  34.9C  Wrist    *3.9 <3.1 3.7  Wrist Base 1st Digit 3.9 0.0    Left Ulnar Anti Sensory (5th Digit)  30.3C  Wrist *NR  <3.7  >15.0 Wrist 5th Digit  14.0  >38  Right Ulnar Anti Sensory (5th Digit)  30.8C  Wrist    *5.3 <3.7 *8.6 >15.0 Wrist 5th Digit 5.3 14.0 *26 >38   Motor Summary Table   Stim Site NR Onset (ms) Norm Onset (ms) O-P Amp (mV) Norm O-P Amp Site1 Site2 Delta-0 (ms) Dist (cm) Vel (m/s) Norm Vel (m/s)  Left Median Motor (Abd Poll Brev)  30.9C  Wrist    *6.6 <4.2 *2.3 >5 Elbow Wrist 7.4 27.0 *36 >50  Elbow    14.0  1.5         Right Median Motor (Abd Poll Brev)  32.8C  Wrist    *5.7 <4.2 *3.4 >5 Elbow Wrist 6.6 27.0 *41 >50  Elbow    12.3  2.7         Left Ulnar Motor (Abd Dig Min)  30.9C  Wrist    *4.8 <4.2 *0.6 >3 B Elbow Wrist 7.1 25.0 *35 >53  B Elbow    11.9  0.3  A Elbow B Elbow 2.9 10.0 *34 >53  A Elbow    14.8  0.3         Right Ulnar Motor (Abd Dig Min)  30.9C  Wrist    *4.8 <4.2 *1.2 >3 B Elbow Wrist 6.6 25.5 *39 >53  B Elbow    11.4  0.7  A Elbow B Elbow 2.2 10.0 *45 >53  A Elbow    13.6  0.5           EMG   Side Muscle Nerve Root Ins Act Fibs Psw Amp Dur Poly Recrt Int Fraser Din Comment  Left 1stDorInt Ulnar C8-T1 *Decr *4+ *4+ *Incr Nml 0 *Reduced Nml   Left Abd Poll Brev Median C8-T1 *Decr *4+ *4+ *Incr Nml 0 *Reduced Nml   Left ExtDigCom   Nml Nml Nml Nml Nml 0 Nml Nml   Left Triceps Radial C6-7-8 Nml Nml Nml Nml Nml 0 Nml Nml   Left Deltoid Axillary C5-6 Nml Nml Nml Nml Nml 0 Nml  Nml     Nerve Conduction Studies Anti Sensory Left/Right Comparison   Stim Site L Lat (ms) R Lat (ms) L-R Lat (ms) L Amp (V) R Amp (V) L-R Amp (%) Site1 Site2 L Vel (m/s) R Vel (m/s) L-R Vel (m/s)  Median Acr Palm Anti Sensory (2nd Digit)  30.1C  Wrist *6.5 *5.4 1.1 *4.4 *7.2 38.9 Wrist Palm     Palm  *2.1   4.6        Radial Anti Sensory (Base 1st Digit)  30.5C  Wrist *5.1 *3.9 1.2 3.8 3.7 2.6 Wrist Base 1st Digit     Ulnar Anti Sensory (5th Digit)  30.3C  Wrist  *5.3   *8.6  Wrist 5th Digit  26    Motor Left/Right Comparison   Stim Site L Lat (ms) R Lat (ms) L-R Lat (ms) L Amp (mV) R Amp (mV) L-R Amp (%) Site1 Site2 L Vel (m/s) R Vel (m/s) L-R Vel (m/s)  Median Motor (Abd Poll Brev)  30.9C  Wrist *6.6 *5.7 *0.9 *2.3 *3.4 32.4 Elbow Wrist *36 *41 5  Elbow 14.0 12.3 1.7 1.5 2.7 44.4       Ulnar Motor (Abd Dig Min)  30.9C  Wrist *4.8 *4.8 0.0 *0.6 *1.2 *50.0 B Elbow Wrist *35 *39 4  B Elbow 11.9 11.4 0.5 0.3 0.7 57.1 A Elbow B Elbow *34 *45 11  A Elbow 14.8 13.6 1.2 0.3 0.5 40.0             Clinical History: No specialty comments available.  He reports that he quit smoking about 48 years ago. His smoking use included Cigarettes. He has a 44.00 pack-year smoking history. He has never used smokeless tobacco.   Recent Labs  10/18/16 1753 12/26/16 1443 01/30/17 1231  HGBA1C 10.0* 6.8* 6.6*    Objective:  VS:  HT:    WT:   BMI:     BP:   HR: bpm  TEMP: ( )  RESP:  Physical Exam  Musculoskeletal:  Examination of the bilateral hand shows extensive atrophy of the FDI as well as  hand intrinsic musculature as well as APB. He has weakness with thumb abduction as well as finger abduction. He has a negative Tinel's at the wrist and elbow. He has decreased sensation or an ulnar nerve distribution but also in a median nerve distribution.    Ortho Exam Imaging: No results found.  Past Medical/Family/Surgical/Social History: Medications & Allergies reviewed per EMR Patient Active Problem List   Diagnosis Date Noted  . Anemia 01/31/2017  . Hypoxia   . Closed left hip fracture, initial encounter ([place]) 12/26/2016    Class: Chronic  . Status post lumbar laminectomy 12/26/2016    Class: Chronic  . S/p left hip fracture 12/26/2016  . Idiopathic chronic venous hypertension of left lower extremity with inflammation 11/07/2016  . Subacute left lumbar radiculopathy 10/18/2016    Class: Chronic  . Spinal stenosis of lumbar region with neurogenic claudication 10/18/2016  . Skin ulcer ([place]) 09/25/2016  . Spinal stenosis of lumbar region 09/25/2016  . Fall 07/04/2016  . Acute renal failure superimposed on stage 3 chronic kidney disease ([place])   . COPD (chronic obstructive pulmonary disease) ([place]) 06/10/2016  . Right second toe ulcer ([place]) 05/20/2016  . Diabetic ulcer of left foot ([place]) 03/24/2016  . Weakness 03/18/2016  . Mood disorder ([place]) 03/18/2016  . Groin hematoma   . Chronic renal failure   . Critical lower limb ischemia 02/28/2016  . Peripheral arterial  disease ([place]) 10/17/2015  . Edema 07/19/2015  . Claudication ([place]) 01/31/2015  . DOE (dyspnea on exertion) 01/31/2015  . Hyperlipidemia 01/31/2015  . HTN (hypertension) 01/31/2015  . Cerumen impaction 05/26/2014  . Chronic diastolic CHF (congestive heart failure) ([place]) 01/21/2014  . Seborrheic keratoses 04/30/2013  . CHF (congestive heart failure) ([place]) 12/16/2012  . Atrial fibrillation ([place]) 12/16/2012  . Hyperkalemia 11/27/2011  . Bladder cancer ([place]) 09/22/2011  . Leg cramps 08/28/2011  . Cough 05/26/2011   . CAD (coronary artery disease) of artery bypass graft 05/09/2011  . Dyspnea on exertion 04/19/2011  . PSORIASIS, SCALP 10/25/2008  . Chronic kidney disease, stage III (moderate) 12/04/2007  . Type 2 diabetes mellitus with diabetic polyneuropathy ([place]) 05/21/2007  . DIVERTICULOSIS, COLON 05/21/2007  . BPH (benign prostatic hyperplasia) 05/21/2007   Past Medical History:  Diagnosis Date  . Arthritis   . Back pain    s/p lumbar injection 2014  . BENIGN PROSTATIC HYPERTROPHY 05/21/2007  . Bladder cancer ([place]) 10/2011  . CAD (coronary artery disease) CARDIOLOGIST- DR WALL - VISIT IN JUNE 2012 FOR CATH   nonobstructive by cath 6/12:  mid LAD 30%, proximal obtuse marginal-2 30%, proximal RCA 20%, mid RCA 30-40%.  He had normal cardiac output and mildly elevated filling pressures but no significant pulmonary hypertension;   Echocardiogram in May 2012 demonstrated EF 50-55% and left atrial enlargement   . Cellulitis of left foot    Hospitalized in 2006  . CHF (congestive heart failure) ([place])    per family, issues in 2012  . Chronic kidney disease (CKD), stage III (moderate) 12/04/2007   FOLLOWED BY DR PATEL  . Chronic pain of lower extremity   . Complication of anesthesia    1 time bladder cancer surgery- 2012 , oxygen satruratioon dropped had to stay overnight, no problems since then.  Marland Kitchen COPD (chronic obstructive pulmonary disease) ([place])   . DIABETES MELLITUS, TYPE II 05/21/2007  . DIVERTICULOSIS, COLON 05/21/2007   pt denies  . Dyspnea    with exertion, patient mointors saturation  . Fatigue AGE-RELATED  . Gross hematuria    pt denies  . HYPERTENSION 05/21/2007  . Impaired hearing BILATERAL HEARING AIDS  . On home oxygen therapy    "2L at nighttime" (02/28/2016)  . PAD (peripheral artery disease) ([place])   . Pneumonia    Hx  of   . Psoriasis ELBOWS  . Psoriasis   . PSORIASIS, SCALP 10/25/2008   Family History  Problem Relation Age of Onset  . Diabetes Mother   . Heart disease  Brother   . Heart disease Brother   . Heart disease Brother   . Heart disease Brother   . Heart disease Brother   . Heart disease Brother   . Lung cancer Brother    Past Surgical History:  Procedure Laterality Date  . AMPUTATION Left 03/27/2016   Procedure: partial first AMPUTATION RAY; LEFT;  Surgeon: Trula Slade, DPM;  Location: [facility];  Service: Podiatry;  Laterality: Left;  . AMPUTATION Left 06/12/2016   Procedure: TRANSMETATARSAL AMPUTATION LEFT FOOT;  Surgeon: Newt Minion, MD;  Location: [facility];  Service: Orthopedics;  Laterality: Left;  . APPENDECTOMY  1941  . CARDIAC CATHETERIZATION  04-24-11/  DR Rogue Jury ARIDA   MILD NONOBSTRUCTIVE CAD, NORMA CARDIAC OUTPUT  . CARDIOVASCULAR STRESS TEST  2007  . CATARACT EXTRACTION W/ INTRAOCULAR LENS  IMPLANT, BILATERAL Bilateral ~ 2010  . CYSTOSCOPY  12/25/2011   Procedure: CYSTOSCOPY;  Surgeon: Molli Hazard, MD;  Location: [facility];  Service: Urology;  Laterality: N/A;  needs intubation and to be paralyzed   . CYSTOSCOPY W/ RETROGRADES  10/30/2011   Procedure: CYSTOSCOPY WITH RETROGRADE PYELOGRAM;  Surgeon: Molli Hazard, MD;  [facility];  Service: Urology;  Laterality: Bilateral;  CYSTOSCOPY POSS TURBT BILATERAL RETROGRADE PYLEOGRAM   . CYSTOSCOPY W/ RETROGRADES  11/30/2012   Procedure: CYSTOSCOPY WITH RETROGRADE PYELOGRAM;  Surgeon: Molli Hazard, MD;  Location[facility];  Service: Urology;  Laterality: Bilateral;  Flexible cystoscopy.  . CYSTOSCOPY WITH BIOPSY  11/30/2012   Procedure: CYSTOSCOPY WITH BIOPSY;  Surgeon: Molli Hazard, MD;  Location: [facility];  Service: Urology;  Laterality: N/A;  . INCISION AND DRAINAGE FOOT Left ~ 2005   LEFT FOOT DUE TO INFECTION FROM  NAIL PUNCTURE INJURY  . LUMBAR LAMINECTOMY/DECOMPRESSION MICRODISCECTOMY N/A 10/18/2016   Procedure: LEFT AND CENTRAL L4-5 LUMBAR LAMINECTOMY WITH RESECTION OF  SYNOVIAL CYST;  Surgeon: Jessy Oto, MD;  Location: [facility];  Service: Orthopedics;  Laterality: N/A;  . PENILE PROSTHESIS IMPLANT  1990s  . PERIPHERAL VASCULAR CATHETERIZATION N/A 02/14/2016   Procedure: Abdominal Aortogram w/Lower Extremity;  Surgeon: Wellington Hampshire, MD;  Location: [facility];  Service: Cardiovascular;  Laterality: N/A;  . PERIPHERAL VASCULAR CATHETERIZATION Left 02/28/2016   Procedure: Peripheral Vascular Balloon Angioplasty;  Surgeon: Wellington Hampshire, MD;  Location: [facility];  Service: Cardiovascular;  Laterality: Left;  left peroneal and popliteal artery  . [place]  . TOTAL HIP ARTHROPLASTY Left 12/27/2016   Procedure: LEFT TOTAL HIP ARTHROPLASTY ANTERIOR APPROACH;  Surgeon: Mcarthur Rossetti, MD;  Location: [facility];  Service: Orthopedics;  Laterality: Left;  . TRANSURETHRAL RESECTION OF BLADDER TUMOR  10/30/2011   Procedure: TRANSURETHRAL RESECTION OF BLADDER TUMOR (TURBT);  Surgeon: Molli Hazard, MD;  Location[facility];  Service: Urology;  Laterality: N/A;  . TRANSURETHRAL RESECTION OF BLADDER TUMOR  12/25/2011   Procedure: TRANSURETHRAL RESECTION OF BLADDER TUMOR (TURBT);  Surgeon: Molli Hazard, MD;  Location: [facility];  Service: Urology;  Laterality: N/A;  need long gyrus instruments   . VASECTOMY  1990s   Social History   Occupational History  . retired      [employer]	[place].   Social History Main Topics  . Smoking status: Former Smoker    Packs/day: 2.00    Years: 22.00    Types: Cigarettes    Quit date: 11/11/1968  . Smokeless tobacco: Never Used  . Alcohol use No     Comment: occassional- 10/17/16- none  in 4 months- "too sick"  . Drug use: No  . Sexual activity: Not Currently

## 2017-03-03 NOTE — Procedures (Signed)
EMG & NCV Findings: Evaluation of the left median motor and the right median motor nerves showed prolonged distal onset latency (L6.6, R5.7 ms), reduced amplitude (L2.3, R3.4 mV), and decreased conduction velocity (Elbow-Wrist, L36, R41 m/s).  The left ulnar motor and the right ulnar motor nerves showed prolonged distal onset latency (L4.8, R4.8 ms), reduced amplitude (L0.6, R1.2 mV), decreased conduction velocity (B Elbow-Wrist, L35, R39 m/s), and decreased conduction velocity (A Elbow-B Elbow, L34, R45 m/s).  The left median (across palm) sensory nerve showed no response (Palm), prolonged distal peak latency (6.5 ms), and reduced amplitude (4.4 V).  The right median (across palm) sensory nerve showed prolonged distal peak latency (Wrist, 5.4 ms), reduced amplitude (7.2 V), and prolonged distal peak latency (Palm, 2.1 ms).  The left radial sensory and the right radial sensory nerves showed prolonged distal peak latency (L5.1, R3.9 ms).  The left ulnar sensory nerve showed no response (Wrist).  The right ulnar sensory nerve showed prolonged distal peak latency (5.3 ms), reduced amplitude (8.6 V), and decreased conduction velocity (Wrist-5th Digit, 26 m/s).  Left vs. Right side comparison data for the median motor nerve indicates abnormal L-R latency difference (0.9 ms).  The ulnar motor nerve indicates abnormal L-R amplitude difference (50.0 %).    Needle evaluation of the left first dorsal interosseous and the left abductor pollicis brevis muscles showed decreased insertional activity, widespread spontaneous activity, increased motor unit amplitude, and diminished recruitment.  All remaining muscles (as indicated in the following table) showed no evidence of electrical instability.    Impression: The above electrodiagnostic study is ABNORMAL and reveals evidence of severe peripheral polyneuropathy of the bilateral upper extremities.   here is no significant electrodiagnostic evidence of any other focal  nerve entrapment, brachial plexopathy or cervical radiculopathy.   Recommendations: 1.  Follow-up with referring physician. 2.  Continue current management of symptoms, diabetic control, consider occupational therapy for his hands and upper extremities.   Nerve Conduction Studies Anti Sensory Summary Table   Stim Site NR Peak (ms) Norm Peak (ms) P-T Amp (V) Norm P-T Amp Site1 Site2 Delta-P (ms) Dist (cm) Vel (m/s) Norm Vel (m/s)  Left Median Acr Palm Anti Sensory (2nd Digit)  30.1C  Wrist    *6.5 <3.6 *4.4 >10 Wrist Palm  0.0    Palm *NR  <2.0          Right Median Acr Palm Anti Sensory (2nd Digit)  30.7C  Wrist    *5.4 <3.6 *7.2 >10 Wrist Palm 3.3 0.0    Palm    *2.1 <2.0 4.6         Left Radial Anti Sensory (Base 1st Digit)  30.5C  Wrist    *5.1 <3.1 3.8  Wrist Base 1st Digit 5.1 0.0    Right Radial Anti Sensory (Base 1st Digit)  34.9C  Wrist    *3.9 <3.1 3.7  Wrist Base 1st Digit 3.9 0.0    Left Ulnar Anti Sensory (5th Digit)  30.3C  Wrist *NR  <3.7  >15.0 Wrist 5th Digit  14.0  >38  Right Ulnar Anti Sensory (5th Digit)  30.8C  Wrist    *5.3 <3.7 *8.6 >15.0 Wrist 5th Digit 5.3 14.0 *26 >38   Motor Summary Table   Stim Site NR Onset (ms) Norm Onset (ms) O-P Amp (mV) Norm O-P Amp Site1 Site2 Delta-0 (ms) Dist (cm) Vel (m/s) Norm Vel (m/s)  Left Median Motor (Abd Poll Brev)  30.9C  Wrist    *6.6 <4.2 *2.3 >5  Elbow Wrist 7.4 27.0 *36 >50  Elbow    14.0  1.5         Right Median Motor (Abd Poll Brev)  32.8C  Wrist    *5.7 <4.2 *3.4 >5 Elbow Wrist 6.6 27.0 *41 >50  Elbow    12.3  2.7         Left Ulnar Motor (Abd Dig Min)  30.9C  Wrist    *4.8 <4.2 *0.6 >3 B Elbow Wrist 7.1 25.0 *35 >53  B Elbow    11.9  0.3  A Elbow B Elbow 2.9 10.0 *34 >53  A Elbow    14.8  0.3         Right Ulnar Motor (Abd Dig Min)  30.9C  Wrist    *4.8 <4.2 *1.2 >3 B Elbow Wrist 6.6 25.5 *39 >53  B Elbow    11.4  0.7  A Elbow B Elbow 2.2 10.0 *45 >53  A Elbow    13.6  0.5          EMG   Side  Muscle Nerve Root Ins Act Fibs Psw Amp Dur Poly Recrt Int Fraser Din Comment  Left 1stDorInt Ulnar C8-T1 *Decr *4+ *4+ *Incr Nml 0 *Reduced Nml   Left Abd Poll Brev Median C8-T1 *Decr *4+ *4+ *Incr Nml 0 *Reduced Nml   Left ExtDigCom   Nml Nml Nml Nml Nml 0 Nml Nml   Left Triceps Radial C6-7-8 Nml Nml Nml Nml Nml 0 Nml Nml   Left Deltoid Axillary C5-6 Nml Nml Nml Nml Nml 0 Nml Nml     Nerve Conduction Studies Anti Sensory Left/Right Comparison   Stim Site L Lat (ms) R Lat (ms) L-R Lat (ms) L Amp (V) R Amp (V) L-R Amp (%) Site1 Site2 L Vel (m/s) R Vel (m/s) L-R Vel (m/s)  Median Acr Palm Anti Sensory (2nd Digit)  30.1C  Wrist *6.5 *5.4 1.1 *4.4 *7.2 38.9 Wrist Palm     Palm  *2.1   4.6        Radial Anti Sensory (Base 1st Digit)  30.5C  Wrist *5.1 *3.9 1.2 3.8 3.7 2.6 Wrist Base 1st Digit     Ulnar Anti Sensory (5th Digit)  30.3C  Wrist  *5.3   *8.6  Wrist 5th Digit  26    Motor Left/Right Comparison   Stim Site L Lat (ms) R Lat (ms) L-R Lat (ms) L Amp (mV) R Amp (mV) L-R Amp (%) Site1 Site2 L Vel (m/s) R Vel (m/s) L-R Vel (m/s)  Median Motor (Abd Poll Brev)  30.9C  Wrist *6.6 *5.7 *0.9 *2.3 *3.4 32.4 Elbow Wrist *36 *41 5  Elbow 14.0 12.3 1.7 1.5 2.7 44.4       Ulnar Motor (Abd Dig Min)  30.9C  Wrist *4.8 *4.8 0.0 *0.6 *1.2 *50.0 B Elbow Wrist *35 *39 4  B Elbow 11.9 11.4 0.5 0.3 0.7 57.1 A Elbow B Elbow *34 *45 11  A Elbow 14.8 13.6 1.2 0.3 0.5 40.0

## 2017-03-05 ENCOUNTER — Ambulatory Visit (INDEPENDENT_AMBULATORY_CARE_PROVIDER_SITE_OTHER): Payer: Medicare Other | Admitting: Orthopaedic Surgery

## 2017-03-05 DIAGNOSIS — I2510 Atherosclerotic heart disease of native coronary artery without angina pectoris: Secondary | ICD-10-CM | POA: Diagnosis not present

## 2017-03-05 DIAGNOSIS — E1151 Type 2 diabetes mellitus with diabetic peripheral angiopathy without gangrene: Secondary | ICD-10-CM | POA: Diagnosis not present

## 2017-03-05 DIAGNOSIS — Z466 Encounter for fitting and adjustment of urinary device: Secondary | ICD-10-CM | POA: Diagnosis not present

## 2017-03-05 DIAGNOSIS — M7989 Other specified soft tissue disorders: Secondary | ICD-10-CM | POA: Diagnosis not present

## 2017-03-05 DIAGNOSIS — S72002D Fracture of unspecified part of neck of left femur, subsequent encounter for closed fracture with routine healing: Secondary | ICD-10-CM | POA: Diagnosis not present

## 2017-03-05 DIAGNOSIS — I130 Hypertensive heart and chronic kidney disease with heart failure and stage 1 through stage 4 chronic kidney disease, or unspecified chronic kidney disease: Secondary | ICD-10-CM | POA: Diagnosis not present

## 2017-03-05 DIAGNOSIS — I5032 Chronic diastolic (congestive) heart failure: Secondary | ICD-10-CM | POA: Diagnosis not present

## 2017-03-05 DIAGNOSIS — E1122 Type 2 diabetes mellitus with diabetic chronic kidney disease: Secondary | ICD-10-CM | POA: Diagnosis not present

## 2017-03-05 DIAGNOSIS — J449 Chronic obstructive pulmonary disease, unspecified: Secondary | ICD-10-CM | POA: Diagnosis not present

## 2017-03-05 DIAGNOSIS — Z96642 Presence of left artificial hip joint: Secondary | ICD-10-CM | POA: Diagnosis not present

## 2017-03-05 DIAGNOSIS — L89620 Pressure ulcer of left heel, unstageable: Secondary | ICD-10-CM | POA: Diagnosis not present

## 2017-03-05 DIAGNOSIS — R1909 Other intra-abdominal and pelvic swelling, mass and lump: Secondary | ICD-10-CM | POA: Diagnosis not present

## 2017-03-05 DIAGNOSIS — N183 Chronic kidney disease, stage 3 (moderate): Secondary | ICD-10-CM | POA: Diagnosis not present

## 2017-03-10 DIAGNOSIS — Z96642 Presence of left artificial hip joint: Secondary | ICD-10-CM | POA: Diagnosis not present

## 2017-03-10 DIAGNOSIS — E1122 Type 2 diabetes mellitus with diabetic chronic kidney disease: Secondary | ICD-10-CM | POA: Diagnosis not present

## 2017-03-10 DIAGNOSIS — N183 Chronic kidney disease, stage 3 (moderate): Secondary | ICD-10-CM | POA: Diagnosis not present

## 2017-03-10 DIAGNOSIS — Z466 Encounter for fitting and adjustment of urinary device: Secondary | ICD-10-CM | POA: Diagnosis not present

## 2017-03-10 DIAGNOSIS — I130 Hypertensive heart and chronic kidney disease with heart failure and stage 1 through stage 4 chronic kidney disease, or unspecified chronic kidney disease: Secondary | ICD-10-CM | POA: Diagnosis not present

## 2017-03-10 DIAGNOSIS — E1151 Type 2 diabetes mellitus with diabetic peripheral angiopathy without gangrene: Secondary | ICD-10-CM | POA: Diagnosis not present

## 2017-03-10 DIAGNOSIS — I5032 Chronic diastolic (congestive) heart failure: Secondary | ICD-10-CM | POA: Diagnosis not present

## 2017-03-10 DIAGNOSIS — R1909 Other intra-abdominal and pelvic swelling, mass and lump: Secondary | ICD-10-CM | POA: Diagnosis not present

## 2017-03-10 DIAGNOSIS — L89620 Pressure ulcer of left heel, unstageable: Secondary | ICD-10-CM | POA: Diagnosis not present

## 2017-03-10 DIAGNOSIS — J449 Chronic obstructive pulmonary disease, unspecified: Secondary | ICD-10-CM | POA: Diagnosis not present

## 2017-03-10 DIAGNOSIS — M7989 Other specified soft tissue disorders: Secondary | ICD-10-CM | POA: Diagnosis not present

## 2017-03-10 DIAGNOSIS — I509 Heart failure, unspecified: Secondary | ICD-10-CM | POA: Diagnosis not present

## 2017-03-10 DIAGNOSIS — S72002D Fracture of unspecified part of neck of left femur, subsequent encounter for closed fracture with routine healing: Secondary | ICD-10-CM | POA: Diagnosis not present

## 2017-03-10 DIAGNOSIS — I2510 Atherosclerotic heart disease of native coronary artery without angina pectoris: Secondary | ICD-10-CM | POA: Diagnosis not present

## 2017-03-12 DIAGNOSIS — I2510 Atherosclerotic heart disease of native coronary artery without angina pectoris: Secondary | ICD-10-CM | POA: Diagnosis not present

## 2017-03-12 DIAGNOSIS — E1122 Type 2 diabetes mellitus with diabetic chronic kidney disease: Secondary | ICD-10-CM | POA: Diagnosis not present

## 2017-03-12 DIAGNOSIS — M7989 Other specified soft tissue disorders: Secondary | ICD-10-CM | POA: Diagnosis not present

## 2017-03-12 DIAGNOSIS — I130 Hypertensive heart and chronic kidney disease with heart failure and stage 1 through stage 4 chronic kidney disease, or unspecified chronic kidney disease: Secondary | ICD-10-CM | POA: Diagnosis not present

## 2017-03-12 DIAGNOSIS — J449 Chronic obstructive pulmonary disease, unspecified: Secondary | ICD-10-CM | POA: Diagnosis not present

## 2017-03-12 DIAGNOSIS — R1909 Other intra-abdominal and pelvic swelling, mass and lump: Secondary | ICD-10-CM | POA: Diagnosis not present

## 2017-03-12 DIAGNOSIS — I5032 Chronic diastolic (congestive) heart failure: Secondary | ICD-10-CM | POA: Diagnosis not present

## 2017-03-12 DIAGNOSIS — Z96642 Presence of left artificial hip joint: Secondary | ICD-10-CM | POA: Diagnosis not present

## 2017-03-12 DIAGNOSIS — S72002D Fracture of unspecified part of neck of left femur, subsequent encounter for closed fracture with routine healing: Secondary | ICD-10-CM | POA: Diagnosis not present

## 2017-03-12 DIAGNOSIS — L89620 Pressure ulcer of left heel, unstageable: Secondary | ICD-10-CM | POA: Diagnosis not present

## 2017-03-12 DIAGNOSIS — N183 Chronic kidney disease, stage 3 (moderate): Secondary | ICD-10-CM | POA: Diagnosis not present

## 2017-03-12 DIAGNOSIS — E1151 Type 2 diabetes mellitus with diabetic peripheral angiopathy without gangrene: Secondary | ICD-10-CM | POA: Diagnosis not present

## 2017-03-12 DIAGNOSIS — Z466 Encounter for fitting and adjustment of urinary device: Secondary | ICD-10-CM | POA: Diagnosis not present

## 2017-03-24 DIAGNOSIS — E119 Type 2 diabetes mellitus without complications: Secondary | ICD-10-CM | POA: Diagnosis not present

## 2017-03-24 DIAGNOSIS — Z794 Long term (current) use of insulin: Secondary | ICD-10-CM | POA: Diagnosis not present

## 2017-04-01 IMAGING — CR DG FOOT 2V*L*
2 series · 2 of 2 positions shown · non-contrast
Comparison: 03/23/2016

CLINICAL DATA: Post partial amputation left foot

EXAM:
LEFT FOOT - 2 VIEW

[AP]
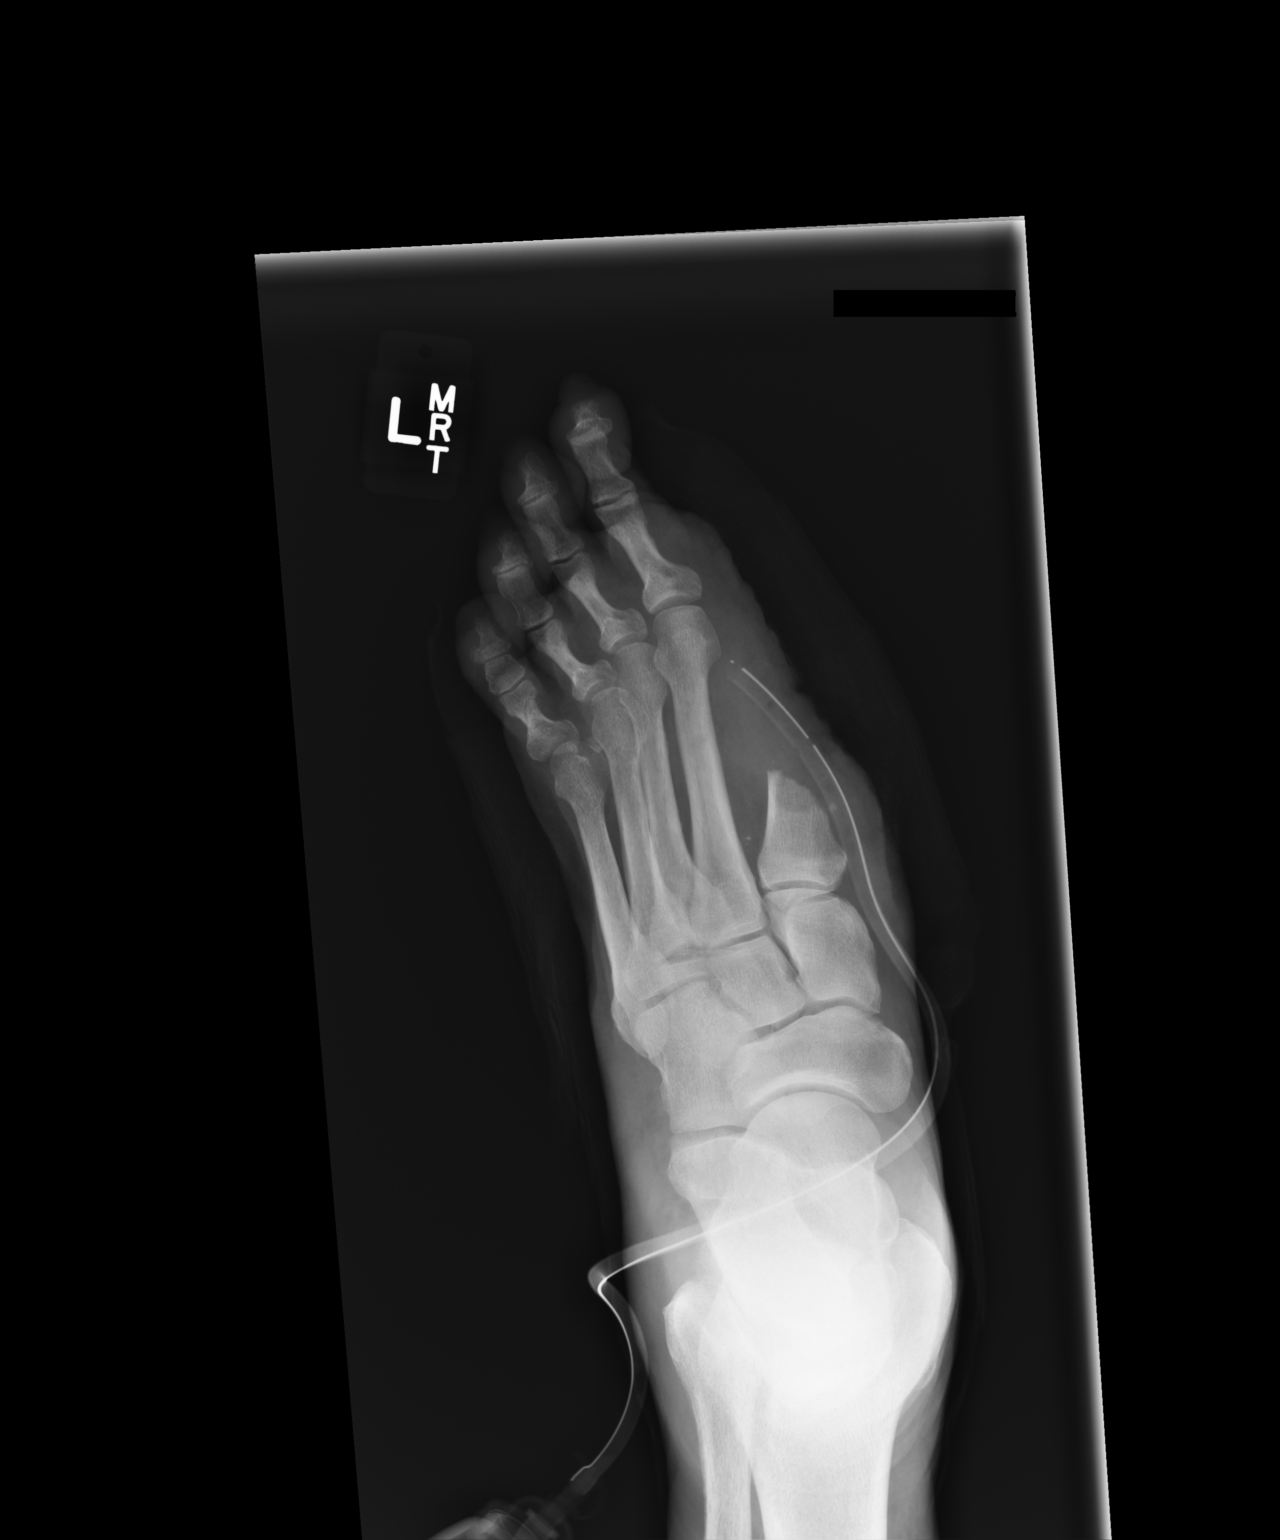

[lateral]
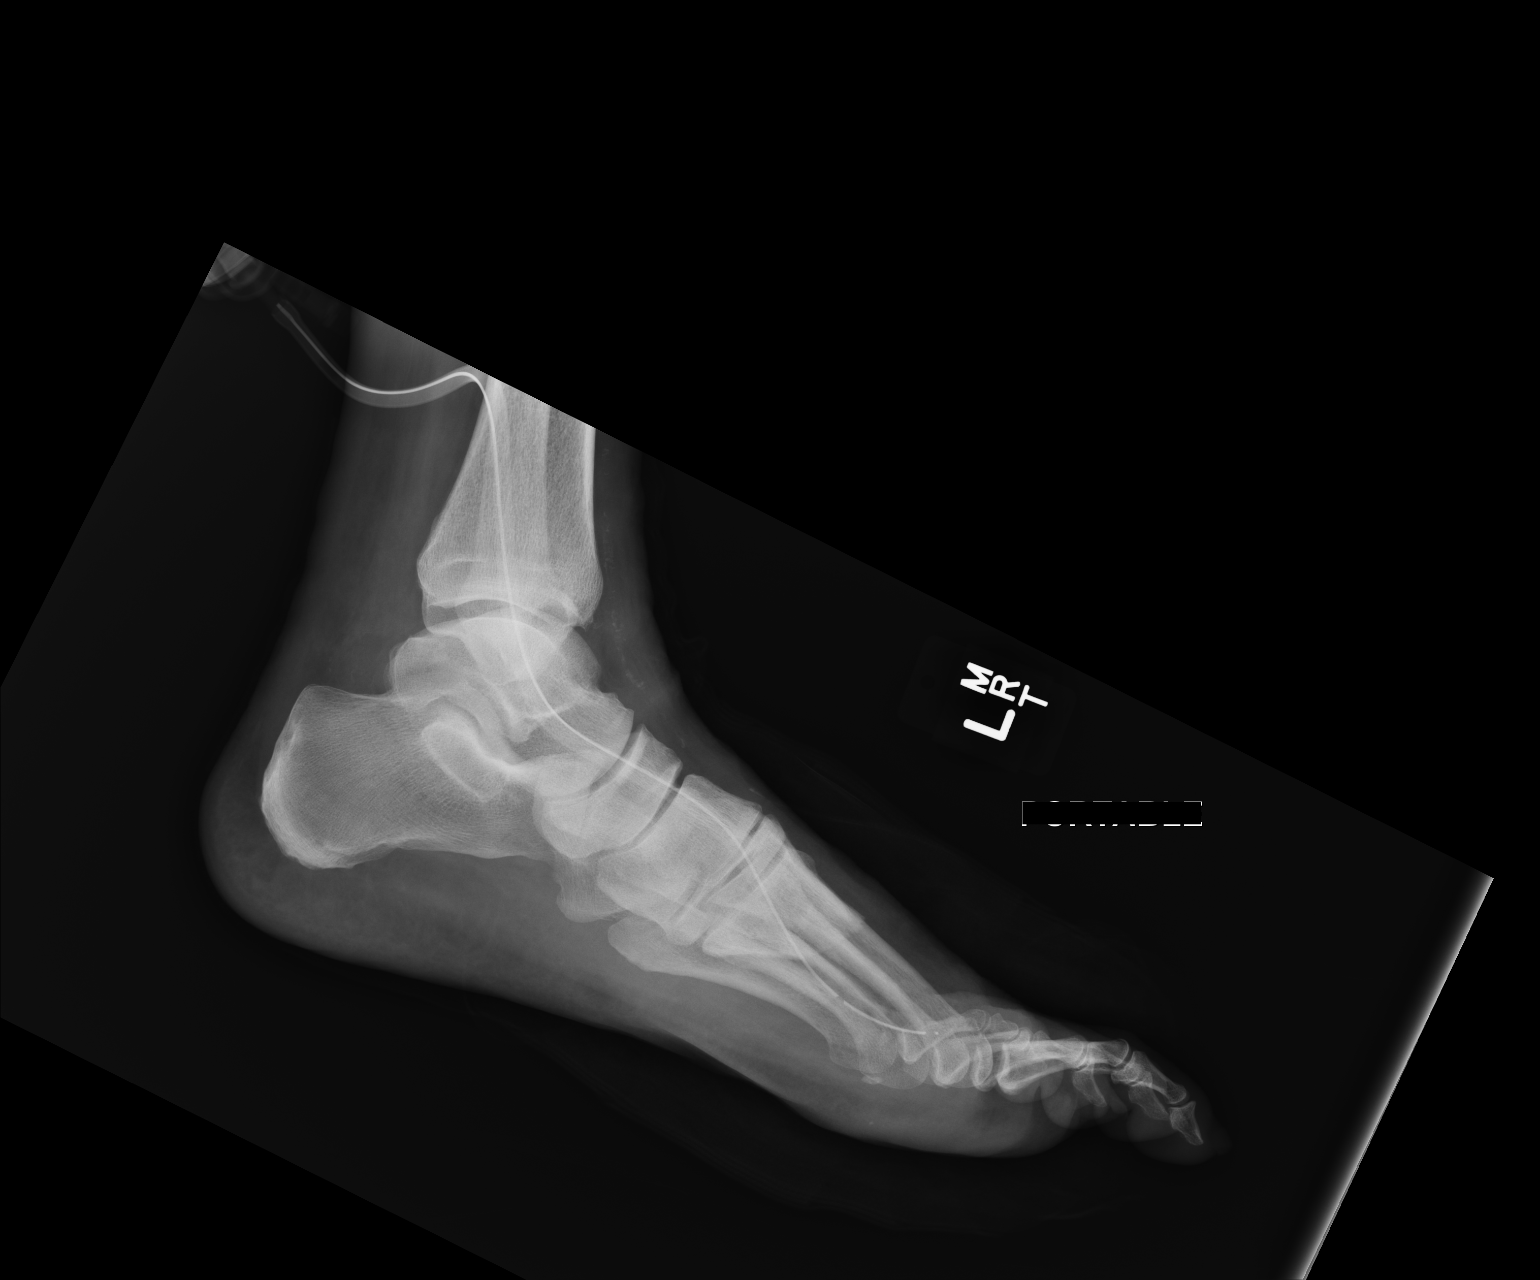

[2 of 2 positions shown; findings below may reference images not displayed]

FINDINGS: Two views of the left foot submitted. The patient is status post
amputation of mid and distal aspect first metatarsal and first toe.
A surgical drain is noted in the region of first metatarsal. The
base of first metatarsal remnant is unremarkable. The alignment is
preserved
IMPRESSION: Status post amputation of mid and distal aspect of first metatarsal
and left great toe. Postsurgical changes with drain in place.

## 2017-04-03 NOTE — Telephone Encounter (Signed)
Routed to medical records 

## 2017-04-09 DIAGNOSIS — I509 Heart failure, unspecified: Secondary | ICD-10-CM | POA: Diagnosis not present

## 2017-04-10 NOTE — Telephone Encounter (Signed)
Mailed to patient

## 2017-04-17 ENCOUNTER — Other Ambulatory Visit: Payer: Self-pay | Admitting: Family Medicine

## 2017-04-17 ENCOUNTER — Other Ambulatory Visit: Payer: Self-pay | Admitting: Cardiovascular Disease

## 2017-04-17 DIAGNOSIS — I4891 Unspecified atrial fibrillation: Secondary | ICD-10-CM

## 2017-04-17 DIAGNOSIS — E785 Hyperlipidemia, unspecified: Secondary | ICD-10-CM

## 2017-04-24 ENCOUNTER — Other Ambulatory Visit: Payer: Self-pay | Admitting: Family Medicine

## 2017-04-24 NOTE — Telephone Encounter (Signed)
Electronic refill request Oxycodone.   Last office visit:   01/30/17 Last Filled:    40 tablet 0 02/28/2017  Please advise.

## 2017-04-25 ENCOUNTER — Other Ambulatory Visit: Payer: Self-pay | Admitting: Family Medicine

## 2017-04-25 DIAGNOSIS — Z794 Long term (current) use of insulin: Secondary | ICD-10-CM

## 2017-04-25 DIAGNOSIS — E1142 Type 2 diabetes mellitus with diabetic polyneuropathy: Secondary | ICD-10-CM

## 2017-04-25 MED ORDER — OXYCODONE HCL 15 MG PO TABS: 7.5000 mg | ORAL_TABLET | Freq: Three times a day (TID) | ORAL | 0 refills | Status: DC | PRN

## 2017-04-25 NOTE — Telephone Encounter (Signed)
Patient advised.  Rx left at front desk for pick up. 

## 2017-04-25 NOTE — Telephone Encounter (Signed)
Printed.  Thanks.  

## 2017-05-06 ENCOUNTER — Other Ambulatory Visit (INDEPENDENT_AMBULATORY_CARE_PROVIDER_SITE_OTHER): Payer: Medicare Other

## 2017-05-06 DIAGNOSIS — E1142 Type 2 diabetes mellitus with diabetic polyneuropathy: Secondary | ICD-10-CM | POA: Diagnosis not present

## 2017-05-06 DIAGNOSIS — E1149 Type 2 diabetes mellitus with other diabetic neurological complication: Secondary | ICD-10-CM

## 2017-05-06 DIAGNOSIS — Z794 Long term (current) use of insulin: Secondary | ICD-10-CM | POA: Diagnosis not present

## 2017-05-06 DIAGNOSIS — E1140 Type 2 diabetes mellitus with diabetic neuropathy, unspecified: Secondary | ICD-10-CM | POA: Diagnosis not present

## 2017-05-06 LAB — BASIC METABOLIC PANEL
BUN: 31 mg/dL — ABNORMAL HIGH (ref 6–23)
CO2: 30 mEq/L (ref 19–32)
Calcium: 8.8 mg/dL (ref 8.4–10.5)
Chloride: 104 mEq/L (ref 96–112)
Creatinine, Ser: 1.65 mg/dL — ABNORMAL HIGH (ref 0.40–1.50)
GFR: 42.2 mL/min — ABNORMAL LOW (ref >60.00)
Glucose, Bld: 170 mg/dL — ABNORMAL HIGH (ref 70–99)
Potassium: 4.4 mEq/L (ref 3.5–5.1)
Sodium: 140 mEq/L (ref 135–145)

## 2017-05-06 LAB — HEMOGLOBIN A1C: Hgb A1c MFr Bld: 7.9 % — ABNORMAL HIGH (ref 4.6–6.5)

## 2017-05-10 DIAGNOSIS — I509 Heart failure, unspecified: Secondary | ICD-10-CM | POA: Diagnosis not present

## 2017-05-12 ENCOUNTER — Ambulatory Visit (INDEPENDENT_AMBULATORY_CARE_PROVIDER_SITE_OTHER): Payer: Medicare Other | Admitting: Family Medicine

## 2017-05-12 ENCOUNTER — Encounter: Payer: Self-pay | Admitting: Family Medicine

## 2017-05-12 DIAGNOSIS — N183 Chronic kidney disease, stage 3 unspecified: Secondary | ICD-10-CM

## 2017-05-12 DIAGNOSIS — E1142 Type 2 diabetes mellitus with diabetic polyneuropathy: Secondary | ICD-10-CM | POA: Diagnosis not present

## 2017-05-12 DIAGNOSIS — Z794 Long term (current) use of insulin: Secondary | ICD-10-CM

## 2017-05-12 DIAGNOSIS — Z96649 Presence of unspecified artificial hip joint: Secondary | ICD-10-CM | POA: Diagnosis not present

## 2017-05-12 NOTE — Progress Notes (Signed)
Diabetes:  Using medications without difficulties: yes Hypoglycemic episodes:no Hyperglycemic episodes:no Feet problems:  Neuropathy at baseline.   Blood Sugars averaging:80-100 in the AM, only needing sliding scale about once a day.  Taking lantus at night.  eye exam within last year: due, d/w pt.   A1c d/w pt.  Goal A1c likely 7.5 given his age.    L hip replaced 12/2016.  Oxycodone used rarely.  His mood is clearly better.  No adverse effect on medication.  Off O2 in the meantime and only keeps in on hand for prn use- but hasn't needed it recently.    Chronic kidney disease. Creatinine stable compared to previous. Discussed with patient.  Meds, vitals, and allergies reviewed.   ROS: Per HPI unless specifically indicated in ROS section   GEN: nad, alert and oriented HEENT: mucous membranes moist NECK: supple w/o LA CV: rrr. PULM: ctab, no inc wob ABD: soft, +bs EXT: no edema SKIN: no acute rash  Diabetic foot exam: L foot with midfoot amputation.  He can feel monofilament but can't feel the floor as well.   No skin breakdown or calluses o/w on either foot.   Normal cap refill.   R foot DP pulse intact.

## 2017-05-12 NOTE — Assessment & Plan Note (Addendum)
Given everything that has gone on he looks fantastic. He is happy. He is in less pain. His sugar has been better controlled recently. Continue sliding scale insulin as needed. Continue medications as is. Goal A1c around 7.5, discussed with patient. Recheck in about 3 months. Update me sooner as needed. He agrees. I appreciate the help of all involved in his care. He and his family have both working hard to help him get this point. Foot care discussed with patient.

## 2017-05-12 NOTE — Patient Instructions (Signed)
Recheck labs in about 3 months.  Update me as needed.  Take care.  Glad to see you.

## 2017-05-12 NOTE — Assessment & Plan Note (Signed)
Creatinine stable compared to previous. Discussed with patient. Continue as is.

## 2017-05-12 NOTE — Assessment & Plan Note (Signed)
Pain is clearly improved, now only using oxycodone rarely. His mood is clearly improved as his pain is improved. Update me as noted.

## 2017-05-14 ENCOUNTER — Other Ambulatory Visit: Payer: Self-pay | Admitting: Cardiovascular Disease

## 2017-05-14 ENCOUNTER — Other Ambulatory Visit: Payer: Self-pay | Admitting: Cardiology

## 2017-05-14 ENCOUNTER — Other Ambulatory Visit: Payer: Self-pay | Admitting: Family Medicine

## 2017-05-14 DIAGNOSIS — E785 Hyperlipidemia, unspecified: Secondary | ICD-10-CM

## 2017-05-14 DIAGNOSIS — I4891 Unspecified atrial fibrillation: Secondary | ICD-10-CM

## 2017-05-15 ENCOUNTER — Telehealth: Payer: Self-pay | Admitting: Cardiovascular Disease

## 2017-05-15 NOTE — Telephone Encounter (Signed)
Lmov for patient to call and schedule follow up with Dr Fletcher Anon  Will try again at a later time

## 2017-05-15 NOTE — Telephone Encounter (Signed)
-----   Message from Anselm Pancoast, [place] sent at 05/15/2017  8:40 AM EDT ----- Please contact patient for a follow up with Dr. Fletcher Anon.  Thanks!  Arvilla Market

## 2017-05-16 ENCOUNTER — Other Ambulatory Visit: Payer: Self-pay | Admitting: *Deleted

## 2017-05-16 MED ORDER — GLIPIZIDE ER 5 MG PO TB24: 5.0000 mg | ORAL_TABLET | Freq: Every day | ORAL | 3 refills | Status: DC

## 2017-05-20 NOTE — Telephone Encounter (Signed)
Lmov for patient to call and schedule follow up with Dr Fletcher Anon  Will try again at a later time

## 2017-05-22 ENCOUNTER — Encounter: Payer: Self-pay | Admitting: Cardiovascular Disease

## 2017-05-22 NOTE — Telephone Encounter (Signed)
Unable to contact °Sent letter  °

## 2017-05-23 NOTE — Addendum Note (Signed)
Addendum  created 05/23/17 1703 by Lyndle Herrlich, MD   Sign clinical note

## 2017-05-23 NOTE — Anesthesia Postprocedure Evaluation (Signed)
Anesthesia Post Note  Patient: [name] Small  Procedure(s) Performed: Procedure(s) (LRB): LEFT TOTAL HIP ARTHROPLASTY ANTERIOR APPROACH (Left)     Anesthesia Post Evaluation  Last Vitals:  Vitals:   12/31/16 1000 12/31/16 1300  BP: (!) 128/48 (!) 128/54  Pulse: 79 79  Resp: 18 18  Temp: 37 C 36.8 C    Last Pain:  Vitals:   12/31/16 1551  TempSrc:   PainSc: 7                  Deng Kemler EDWARD

## 2017-06-09 DIAGNOSIS — I509 Heart failure, unspecified: Secondary | ICD-10-CM | POA: Diagnosis not present

## 2017-06-26 LAB — HM DIABETES EYE EXAM

## 2017-07-03 ENCOUNTER — Other Ambulatory Visit: Payer: Self-pay | Admitting: Cardiovascular Disease

## 2017-07-10 DIAGNOSIS — I509 Heart failure, unspecified: Secondary | ICD-10-CM | POA: Diagnosis not present

## 2017-07-25 DIAGNOSIS — E119 Type 2 diabetes mellitus without complications: Secondary | ICD-10-CM | POA: Diagnosis not present

## 2017-07-25 DIAGNOSIS — Z794 Long term (current) use of insulin: Secondary | ICD-10-CM | POA: Diagnosis not present

## 2017-07-29 ENCOUNTER — Encounter: Payer: Self-pay | Admitting: Cardiovascular Disease

## 2017-07-29 ENCOUNTER — Ambulatory Visit (INDEPENDENT_AMBULATORY_CARE_PROVIDER_SITE_OTHER): Payer: Medicare Other | Admitting: Cardiovascular Disease

## 2017-07-29 VITALS — BP 140/64 | HR 65 | Ht 74.0 in | Wt 196.0 lb

## 2017-07-29 DIAGNOSIS — E785 Hyperlipidemia, unspecified: Secondary | ICD-10-CM | POA: Diagnosis not present

## 2017-07-29 DIAGNOSIS — I5032 Chronic diastolic (congestive) heart failure: Secondary | ICD-10-CM | POA: Diagnosis not present

## 2017-07-29 DIAGNOSIS — I739 Peripheral vascular disease, unspecified: Secondary | ICD-10-CM

## 2017-07-29 DIAGNOSIS — I10 Essential (primary) hypertension: Secondary | ICD-10-CM | POA: Diagnosis not present

## 2017-07-29 MED ORDER — ATORVASTATIN CALCIUM 10 MG PO TABS: 10.0000 mg | ORAL_TABLET | Freq: Every day | ORAL | 3 refills | Status: DC

## 2017-07-29 NOTE — Patient Instructions (Addendum)
Medication Instructions:  Your physician has recommended you make the following change in your medication:  START taking atorvastatin 10mg  once daily   Labwork: none  Testing/Procedures: Your physician has requested that you have a lower extremity arterial duplex. During this test, exercise and ultrasound are used to evaluate arterial blood flow in the legs. Allow one hour for this exam. There are no restrictions or special instructions.   Follow-Up: Your physician wants you to follow-up in: 6 months with Dr. Fletcher Anon.  You will receive a reminder letter in the mail two months in advance. If you don't receive a letter, please call our office to schedule the follow-up appointment.   Any Other Special Instructions Will Be Listed Below (If Applicable).     If you need a refill on your cardiac medications before your next appointment, please call your pharmacy.

## 2017-07-29 NOTE — Progress Notes (Signed)
Cardiology Office Note   Date:  07/29/2017   ID:  [name] Small, DOB 12/17/1927, MRN [id number]  PCP:  Tonia Ghent, MD  Cardiologist:   Dr. Shanda Howells  Chief Complaint  Patient presents with  . other    12 month follow up. Patient states he is doing well.  Meds reviewed verbally with patient.       History of Present Illness: [name] Small is a 83 y.o. male who presents for a follow up visit regarding peripheral arterial disease. The patient has known history of mild nonobstructive coronary artery disease,Chronic kidney disease, hypertension and diabetes. He quit smoking more than 40 years ago. He had bladder cancer in 2012 treated with chemotherapy. He had critical limb ischemia with nonhealing ulcer on the left foot early 2017. Lower extremity angiography in April, 2017 showed an occluded distal left popliteal artery with reconstitution via extensive collaterals to the proximal to mid peroneal artery which was the only patent vessel below the knee.  I performed successful angioplasty to the popliteal and peroneal artery with good results. The patient continued to have to infections and ultimately underwent amputation of all the toes on the left side. The amputation site has healed completely.   He underwent left hip replacement this year without complication. He has been doing well and denies any chest pain or shortness of breath. No left leg claudication or ulceration.  He stopped taking atorvastatin because he ran out of refills. He continues to take Plavix.   Past Medical History:  Diagnosis Date  . Arthritis   . Back pain    s/p lumbar injection 2014  . BENIGN PROSTATIC HYPERTROPHY 05/21/2007  . Bladder cancer ([place]) 10/2011  . CAD (coronary artery disease) CARDIOLOGIST- DR WALL - VISIT IN JUNE 2012 FOR CATH   nonobstructive by cath 6/12:  mid LAD 30%, proximal obtuse marginal-2 30%, proximal RCA 20%, mid RCA 30-40%.  He had normal cardiac output and mildly  elevated filling pressures but no significant pulmonary hypertension;   Echocardiogram in May 2012 demonstrated EF 50-55% and left atrial enlargement   . Cellulitis of left foot    Hospitalized in 2006  . CHF (congestive heart failure) ([place])    per family, issues in 2012  . Chronic kidney disease (CKD), stage III (moderate) 12/04/2007   FOLLOWED BY DR PATEL  . Chronic pain of lower extremity   . Complication of anesthesia    1 time bladder cancer surgery- 2012 , oxygen satruratioon dropped had to stay overnight, no problems since then.  Marland Kitchen COPD (chronic obstructive pulmonary disease) ([place])   . DIABETES MELLITUS, TYPE II 05/21/2007  . DIVERTICULOSIS, COLON 05/21/2007   pt denies  . Dyspnea    with exertion, patient mointors saturation  . Fatigue AGE-RELATED  . Gross hematuria    pt denies  . HYPERTENSION 05/21/2007  . Impaired hearing BILATERAL HEARING AIDS  . On home oxygen therapy    "2L at nighttime" (02/28/2016)  . PAD (peripheral artery disease) ([place])   . Pneumonia    Hx  of   . Psoriasis ELBOWS  . Psoriasis   . PSORIASIS, SCALP 10/25/2008    Past Surgical History:  Procedure Laterality Date  . AMPUTATION Left 03/27/2016   Procedure: partial first AMPUTATION RAY; LEFT;  Surgeon: Trula Slade, DPM;  Location: [facility];  Service: Podiatry;  Laterality: Left;  . AMPUTATION Left 06/12/2016   Procedure: TRANSMETATARSAL AMPUTATION LEFT FOOT;  Surgeon: Newt Minion, MD;  Location: [facility];  Service: Orthopedics;  Laterality: Left;  . APPENDECTOMY  1941  . CARDIAC CATHETERIZATION  04-24-11/  DR Rogue Jury Delmon Andrada   MILD NONOBSTRUCTIVE CAD, NORMA CARDIAC OUTPUT  . CARDIOVASCULAR STRESS TEST  2007  . CATARACT EXTRACTION W/ INTRAOCULAR LENS  IMPLANT, BILATERAL Bilateral ~ 2010  . CYSTOSCOPY  12/25/2011   Procedure: CYSTOSCOPY;  Surgeon: Molli Hazard, MD;  Location: [facility];  Service: Urology;  Laterality: N/A;  needs intubation and to be paralyzed   . CYSTOSCOPY  W/ RETROGRADES  10/30/2011   Procedure: CYSTOSCOPY WITH RETROGRADE PYELOGRAM;  Surgeon: Molli Hazard, MD;  Location: [facility];  Service: Urology;  Laterality: Bilateral;  CYSTOSCOPY POSS TURBT BILATERAL RETROGRADE PYLEOGRAM   . CYSTOSCOPY W/ RETROGRADES  11/30/2012   Procedure: CYSTOSCOPY WITH RETROGRADE PYELOGRAM;  Surgeon: Molli Hazard, MD;  Location: [facility];  Service: Urology;  Laterality: Bilateral;  Flexible cystoscopy.  . CYSTOSCOPY WITH BIOPSY  11/30/2012   Procedure: CYSTOSCOPY WITH BIOPSY;  Surgeon: Molli Hazard, MD;  Location: [facility];  Service: Urology;  Laterality: N/A;  . INCISION AND DRAINAGE FOOT Left ~ 2005   LEFT FOOT DUE TO INFECTION FROM  NAIL PUNCTURE INJURY  . LUMBAR LAMINECTOMY/DECOMPRESSION MICRODISCECTOMY N/A 10/18/2016   Procedure: LEFT AND CENTRAL L4-5 LUMBAR LAMINECTOMY WITH RESECTION OF SYNOVIAL CYST;  Surgeon: Jessy Oto, MD;  Location: [place];  Service: Orthopedics;  Laterality: N/A;  . PENILE PROSTHESIS IMPLANT  1990s  . PERIPHERAL VASCULAR CATHETERIZATION N/A 02/14/2016   Procedure: Abdominal Aortogram w/Lower Extremity;  Surgeon: Wellington Hampshire, MD;  Location: [facility];  Service: Cardiovascular;  Laterality: N/A;  . PERIPHERAL VASCULAR CATHETERIZATION Left 02/28/2016   Procedure: Peripheral Vascular Balloon Angioplasty;  Surgeon: Wellington Hampshire, MD;  Location: [place];  Service: Cardiovascular;  Laterality: Left;  left peroneal and popliteal artery  . [place]  . TOTAL HIP ARTHROPLASTY Left 12/27/2016   Procedure: LEFT TOTAL HIP ARTHROPLASTY ANTERIOR APPROACH;  Surgeon: Mcarthur Rossetti, MD;  Location: [facility];  Service: Orthopedics;  Laterality: Left;  . TRANSURETHRAL RESECTION OF BLADDER TUMOR  10/30/2011   Procedure: TRANSURETHRAL RESECTION OF BLADDER TUMOR (TURBT);  Surgeon: Molli Hazard, MD;  Location: [facility];  Service: Urology;  Laterality: N/A;  . TRANSURETHRAL RESECTION OF BLADDER TUMOR  12/25/2011   Procedure: TRANSURETHRAL RESECTION OF BLADDER TUMOR (TURBT);  Surgeon: Molli Hazard, MD;  Location: [facility];  Service: Urology;  Laterality: N/A;  need long gyrus instruments   . VASECTOMY  1990s     Current Outpatient Prescriptions  Medication Sig Dispense Refill  . acetaminophen (TYLENOL) 500 MG tablet Take 1,000 mg by mouth every 8 (eight) hours as needed for mild pain, moderate pain or headache.    . albuterol (PROVENTIL) (2.5 MG/3ML) 0.083% nebulizer solution USE 1 VIAL VIA NEBULIZER  EVERY 6 HOURS AS NEEDED FOR WHEEZING OR SHORTNESS OF  BREATH 450 mL 1  . amLODipine (NORVASC) 2.5 MG tablet TAKE 1 TABLET BY MOUTH  DAILY 30 tablet 0  . budesonide-formoterol (SYMBICORT) 160-4.5 MCG/ACT inhaler Inhale 2 puffs into the lungs 2 (two) times daily as needed (shortness of breath).     . clobetasol (TEMOVATE) 0.05 % external solution Apply 1 application topically daily.    . clopidogrel (PLAVIX) 75 MG tablet TAKE 1 TABLET BY MOUTH  DAILY 90 tablet 1  . furosemide (LASIX) 40  MG tablet TAKE 1 TABLET BY MOUTH  EVERY MONDAY, WEDNESDAY,  AND FRIDAY 39 tablet 1  . glipiZIDE (GLUCOTROL XL) 5 MG 24 hr tablet Take 1 tablet (5 mg total) by mouth daily with breakfast. 90 tablet 3  . insulin glargine (LANTUS) 100 UNIT/ML injection Inject 0.15 mLs (15 Units total) into the skin daily. 10 mL   . insulin lispro (HUMALOG) 100 UNIT/ML injection Inject 10 Units into the skin before meals if sugar is >180 prior to the meal.  Inject 5 Units into the skin before meals if sugar is 140-179 prior to the meal.  If sugar <139 or lower before meal, then skip a dose. 10 mL 2  . oxyCODONE (ROXICODONE) 15 MG immediate release tablet Take 0.5-1 tablets (7.5-15 mg total) by mouth every 8 (eight) hours as needed for pain. 40 tablet 0  . polyethylene glycol (MIRALAX / GLYCOLAX) packet Take 17 g  by mouth daily as needed. (Patient taking differently: Take 17 g by mouth daily as needed for mild constipation. ) 14 each 0  . tiotropium (SPIRIVA) 18 MCG inhalation capsule Place 1 capsule (18 mcg total) into inhaler and inhale daily. 90 capsule 3   No current facility-administered medications for this visit.     Allergies:   Actos [pioglitazone hydrochloride]; Aspirin; Ace inhibitors; Bactrim; Gabapentin; Lyrica [pregabalin]; Pravastatin; and Sulfa drugs cross reactors    Social History:  The patient  reports that he quit smoking about 48 years ago. His smoking use included Cigarettes. He has a 44.00 pack-year smoking history. He has never used smokeless tobacco. He reports that he does not drink alcohol or use drugs.   Family History:  The patient's family history includes Diabetes in his mother; Heart disease in his brother, brother, brother, brother, brother, and brother; Lung cancer in his brother.    ROS:  Please see the history of present illness.   Otherwise, review of systems are positive for none.   All other systems are reviewed and negative.    PHYSICAL EXAM: VS:  BP 140/64 (BP Location: Right Arm, Patient Position: Sitting, Cuff Size: Normal)   Pulse 65   Ht 6\' 2"  (1.88 m)   Wt 196 lb (88.9 kg)   BMI 25.16 kg/m  , BMI Body mass index is 25.16 kg/m. GEN: Well nourished, well developed, in no acute distress  HEENT: normal  Neck: no JVD, carotid bruits, or masses Cardiac: RRR; no murmurs, rubs, or gallops,no edema  Respiratory:  clear to auscultation bilaterally, normal work of breathing GI: soft, nontender, nondistended, + BS MS: no deformity or atrophy  Skin: warm and dry, no rash Neuro:  Strength and sensation are intact Psych: euthymic mood, full affect Vascular: Femoral pulses are normal bilaterally.   EKG:  EKG is ordered today. EKG showed normal sinus rhythm with no significant ST or T wave changes.  Recent Labs: 01/30/2017: ALT 8; Hemoglobin 12.1; Platelets  342.0 05/06/2017: BUN 31; Creatinine, Ser 1.65; Potassium 4.4; Sodium 140    Lipid Panel    Component Value Date/Time   CHOL 116 (L) 11/28/2015 0933   TRIG 81 11/28/2015 0933   HDL 43 11/28/2015 0933   CHOLHDL 2.7 11/28/2015 0933   VLDL 16 11/28/2015 0933   LDLCALC 57 11/28/2015 0933   LDLDIRECT 147.6 04/27/2009 1012      Wt Readings from Last 3 Encounters:  07/29/17 196 lb (88.9 kg)  05/12/17 193 lb (87.5 kg)  12/31/16 212 lb 4.9 oz (96.3 kg)  ASSESSMENT AND PLAN:   1. Peripheral arterial disease:   He is status post successful angioplasty to the left popliteal and peroneal artery.  No evidence of recurrent ulceration or claudication. I requested a follow-up arterial Doppler/duplex. Continue long-term treatment with Plavix.  2. Hyperlipidemia: Given the presence of peripheral arterial disease, I resumed atorvastatin 10 mg once daily. His LDL was 57 while he was taking the medication but he stopped taking it due to running out of refills.  3. Chronic kidney disease: Renal function has been stable.  4. Essential hypertension: Blood pressure is controlled on current medications.   Disposition:   FU with me in 6 months  Signed,  [name], MD  07/29/2017 2:46 PM    [place]

## 2017-07-30 ENCOUNTER — Other Ambulatory Visit: Payer: Self-pay | Admitting: Family Medicine

## 2017-07-30 ENCOUNTER — Other Ambulatory Visit: Payer: Self-pay | Admitting: Cardiovascular Disease

## 2017-07-30 ENCOUNTER — Other Ambulatory Visit: Payer: Self-pay | Admitting: *Deleted

## 2017-07-30 DIAGNOSIS — I739 Peripheral vascular disease, unspecified: Secondary | ICD-10-CM

## 2017-07-30 MED ORDER — AMLODIPINE BESYLATE 2.5 MG PO TABS: 2.5000 mg | ORAL_TABLET | Freq: Every day | ORAL | 3 refills | Status: DC

## 2017-08-03 ENCOUNTER — Other Ambulatory Visit: Payer: Self-pay | Admitting: Family Medicine

## 2017-08-03 DIAGNOSIS — E119 Type 2 diabetes mellitus without complications: Secondary | ICD-10-CM

## 2017-08-04 MED ORDER — OXYCODONE HCL 15 MG PO TABS: 7.5000 mg | ORAL_TABLET | Freq: Three times a day (TID) | ORAL | 0 refills | Status: DC | PRN

## 2017-08-04 NOTE — Telephone Encounter (Signed)
MyChart Renewal Oxycodone Last office visit:   05/12/17 Last Filled:    40 tablet 0 04/25/2017  Please advise.

## 2017-08-04 NOTE — Telephone Encounter (Signed)
Printed.  Due for labs prior to OV.  Thanks.  Labs ordered.

## 2017-08-04 NOTE — Telephone Encounter (Signed)
Wife advised.  Lab appt and OV scheduled.

## 2017-08-06 ENCOUNTER — Ambulatory Visit: Payer: Medicare Other

## 2017-08-06 DIAGNOSIS — I739 Peripheral vascular disease, unspecified: Secondary | ICD-10-CM

## 2017-08-07 ENCOUNTER — Other Ambulatory Visit: Payer: Self-pay

## 2017-08-07 DIAGNOSIS — I739 Peripheral vascular disease, unspecified: Secondary | ICD-10-CM

## 2017-08-07 LAB — VAS US LOWER EXTREMITY ARTERIAL DUPLEX
LEFT PERO DIST SYS: 62 cm/s
LEFT POPLITEAL DIST DYS VEL: 0 cm/s
LEFT POPLITEAL PROX DYS VEL: 0 cm/s
LEFT SFA DIST DYS VEL: 0 cm/s
LEFT SFA MID VEL: 0 cm/s
LEFT SFA PROX DYS VEL: 1 cm/s
Left popliteal dist sys PSV: 71 cm/s
Left popliteal prox sys PSV: 94 cm/s
Left super femoral dist sys PSV: -80 cm/s
Left super femoral mid sys PSV: -140 cm/s
Left super femoral prox sys PSV: 90 cm/s
left post tibial dist dia: 0 cm/s
left post tibial dist sys: 51 cm/s

## 2017-08-10 DIAGNOSIS — I509 Heart failure, unspecified: Secondary | ICD-10-CM | POA: Diagnosis not present

## 2017-08-12 ENCOUNTER — Other Ambulatory Visit (INDEPENDENT_AMBULATORY_CARE_PROVIDER_SITE_OTHER): Payer: Medicare Other

## 2017-08-12 DIAGNOSIS — E119 Type 2 diabetes mellitus without complications: Secondary | ICD-10-CM | POA: Diagnosis not present

## 2017-08-12 LAB — COMPREHENSIVE METABOLIC PANEL
ALT: 7 U/L (ref 0–53)
AST: 12 U/L (ref 0–37)
Albumin: 3.9 g/dL (ref 3.5–5.2)
Alkaline Phosphatase: 55 U/L (ref 39–117)
BUN: 41 mg/dL — ABNORMAL HIGH (ref 6–23)
CO2: 32 mEq/L (ref 19–32)
Calcium: 9.1 mg/dL (ref 8.4–10.5)
Chloride: 100 mEq/L (ref 96–112)
Creatinine, Ser: 1.83 mg/dL — ABNORMAL HIGH (ref 0.40–1.50)
GFR: 37.42 mL/min — ABNORMAL LOW (ref >60.00)
Glucose, Bld: 215 mg/dL — ABNORMAL HIGH (ref 70–99)
Potassium: 4.6 mEq/L (ref 3.5–5.1)
Sodium: 138 mEq/L (ref 135–145)
Total Bilirubin: 0.6 mg/dL (ref 0.2–1.2)
Total Protein: 6.6 g/dL (ref 6.0–8.3)

## 2017-08-12 LAB — LIPID PANEL
Cholesterol: 131 mg/dL (ref 0–200)
HDL: 38.8 mg/dL — ABNORMAL LOW (ref >39.00)
LDL Cholesterol: 69 mg/dL (ref 0–99)
NonHDL: 92.1
Total CHOL/HDL Ratio: 3
Triglycerides: 114 mg/dL (ref 0.0–149.0)
VLDL: 22.8 mg/dL (ref 0.0–40.0)

## 2017-08-12 LAB — HEMOGLOBIN A1C: Hgb A1c MFr Bld: 8.4 % — ABNORMAL HIGH (ref 4.6–6.5)

## 2017-08-15 ENCOUNTER — Encounter: Payer: Self-pay | Admitting: Family Medicine

## 2017-08-15 ENCOUNTER — Ambulatory Visit (INDEPENDENT_AMBULATORY_CARE_PROVIDER_SITE_OTHER): Payer: Medicare Other | Admitting: Family Medicine

## 2017-08-15 VITALS — BP 122/68 | HR 60 | Temp 97.9°F

## 2017-08-15 DIAGNOSIS — Z23 Encounter for immunization: Secondary | ICD-10-CM

## 2017-08-15 DIAGNOSIS — Z794 Long term (current) use of insulin: Secondary | ICD-10-CM

## 2017-08-15 DIAGNOSIS — Z96649 Presence of unspecified artificial hip joint: Secondary | ICD-10-CM | POA: Diagnosis not present

## 2017-08-15 DIAGNOSIS — E785 Hyperlipidemia, unspecified: Secondary | ICD-10-CM

## 2017-08-15 DIAGNOSIS — N183 Chronic kidney disease, stage 3 unspecified: Secondary | ICD-10-CM

## 2017-08-15 DIAGNOSIS — E1142 Type 2 diabetes mellitus with diabetic polyneuropathy: Secondary | ICD-10-CM | POA: Diagnosis not present

## 2017-08-15 DIAGNOSIS — I10 Essential (primary) hypertension: Secondary | ICD-10-CM

## 2017-08-15 NOTE — Patient Instructions (Signed)
If you aren't eating much for supper, then cut the nighttime insulin back to 5 or 10 units.  Recheck in about 3 months, labs ahead of time.  Take care.  Glad to see you.  Update me as needed.

## 2017-08-15 NOTE — Progress Notes (Signed)
Diabetes:  Using medications without difficulties: see below Hypoglycemic episodes: rare low sugars, this was the first time in a long time to have a low sugar.   Hyperglycemic episodes: no Feet problems: at baseline Blood Sugars averaging:  Usually ~130 in the AMs, occ lower.   eye exam within last year: d/w pt.  He had a low of 69 this AM.  He didn't feel well in general yesterday.  He didn't have much for supper last night and that likely influenced the low sugar this AM.    Hypertension:    Using medication without problems or lightheadedness: yes Chest pain with exertion:no Edema:no Short of breath:no  Elevated Cholesterol: Using medications without problems:yes Muscle aches: not from statin Diet compliance: yes Exercise: at tolerated  Hip pain, longstanding.  He has been taking oxycodone about once a week.  Not daily use.  He can't use ibuprofen.  He may be able to wean down to tylenol use.   D/w pt. He is frustrated about not being able to work all day in the yard, d/w pt.  He is still cutting his grass.    PMH and SH reviewed.   Vital signs, Meds and allergies reviewed.  ROS: Per HPI unless specifically indicated in ROS section   GEN: nad, alert and oriented HEENT: mucous membranes moist NECK: supple w/o LA CV: IRR at baseline.  PULM: ctab, no inc wob ABD: soft, +bs EXT: no edema SKIN: no acute rash  Diabetic foot exam: Amputation noted on L foot.  No pulse L foot palpated.  Palpable R DP pulse.  No skin breakdown No calluses  Normal sensation to light tough and monofilament Nails normal on R foot.

## 2017-08-17 NOTE — Assessment & Plan Note (Signed)
Hip pain, longstanding.  He has been taking oxycodone about once a week with a pain flare.  Not daily use.  He can't use ibuprofen.  He may be able to wean down to tylenol use.   D/w pt. He is frustrated about not being able to work all day in the yard, d/w pt.  He is still cutting his grass.  He'll update me as needed.  We talked about doing what he was able to do that day, with some acceptance.

## 2017-08-17 NOTE — Assessment & Plan Note (Signed)
Cr reasonable, d/w pt.

## 2017-08-17 NOTE — Assessment & Plan Note (Signed)
Reasonable control. Continue as is. No change in meds. He agrees.

## 2017-08-17 NOTE — Assessment & Plan Note (Signed)
Lipids are reasonable. Continue statin. No adverse effect on medication. Labs discussed with patient. >25 minutes spent in face to face time with patient, >50% spent in counselling or coordination of care.

## 2017-08-17 NOTE — Assessment & Plan Note (Signed)
He had a low of 69 this AM.  He didn't feel well in general yesterday.  He didn't have much for supper last night and that likely influenced the low sugar this AM.   If limited intake for for supper, then cut the nighttime insulin back to 5 or 10 units.  Recheck in about 3 months, labs ahead of time.  He agrees.  Update me as needed in the meantime.

## 2017-08-19 ENCOUNTER — Encounter: Payer: Self-pay | Admitting: Family Medicine

## 2017-09-09 DIAGNOSIS — I509 Heart failure, unspecified: Secondary | ICD-10-CM | POA: Diagnosis not present

## 2017-10-10 DIAGNOSIS — I509 Heart failure, unspecified: Secondary | ICD-10-CM | POA: Diagnosis not present

## 2017-10-24 DIAGNOSIS — Z794 Long term (current) use of insulin: Secondary | ICD-10-CM | POA: Diagnosis not present

## 2017-10-24 DIAGNOSIS — E119 Type 2 diabetes mellitus without complications: Secondary | ICD-10-CM | POA: Diagnosis not present

## 2017-11-03 ENCOUNTER — Other Ambulatory Visit: Payer: Self-pay | Admitting: Family Medicine

## 2017-11-09 DIAGNOSIS — I509 Heart failure, unspecified: Secondary | ICD-10-CM | POA: Diagnosis not present

## 2017-11-24 ENCOUNTER — Other Ambulatory Visit: Payer: Self-pay | Admitting: Cardiovascular Disease

## 2017-11-24 DIAGNOSIS — E785 Hyperlipidemia, unspecified: Secondary | ICD-10-CM

## 2017-11-24 DIAGNOSIS — I4891 Unspecified atrial fibrillation: Secondary | ICD-10-CM

## 2017-12-10 DIAGNOSIS — I509 Heart failure, unspecified: Secondary | ICD-10-CM | POA: Diagnosis not present

## 2018-01-02 IMAGING — DX DG CHEST 1V PORT
2 series · 2 of 2 positions shown · non-contrast
Comparison: Portable exam 8980 hours compared to 12/26/2016

CLINICAL DATA: Hypoxia, CHF, hypertension, diabetes mellitus, COPD

EXAM:
PORTABLE CHEST 1 VIEW

[chest ap (1 of 2)]
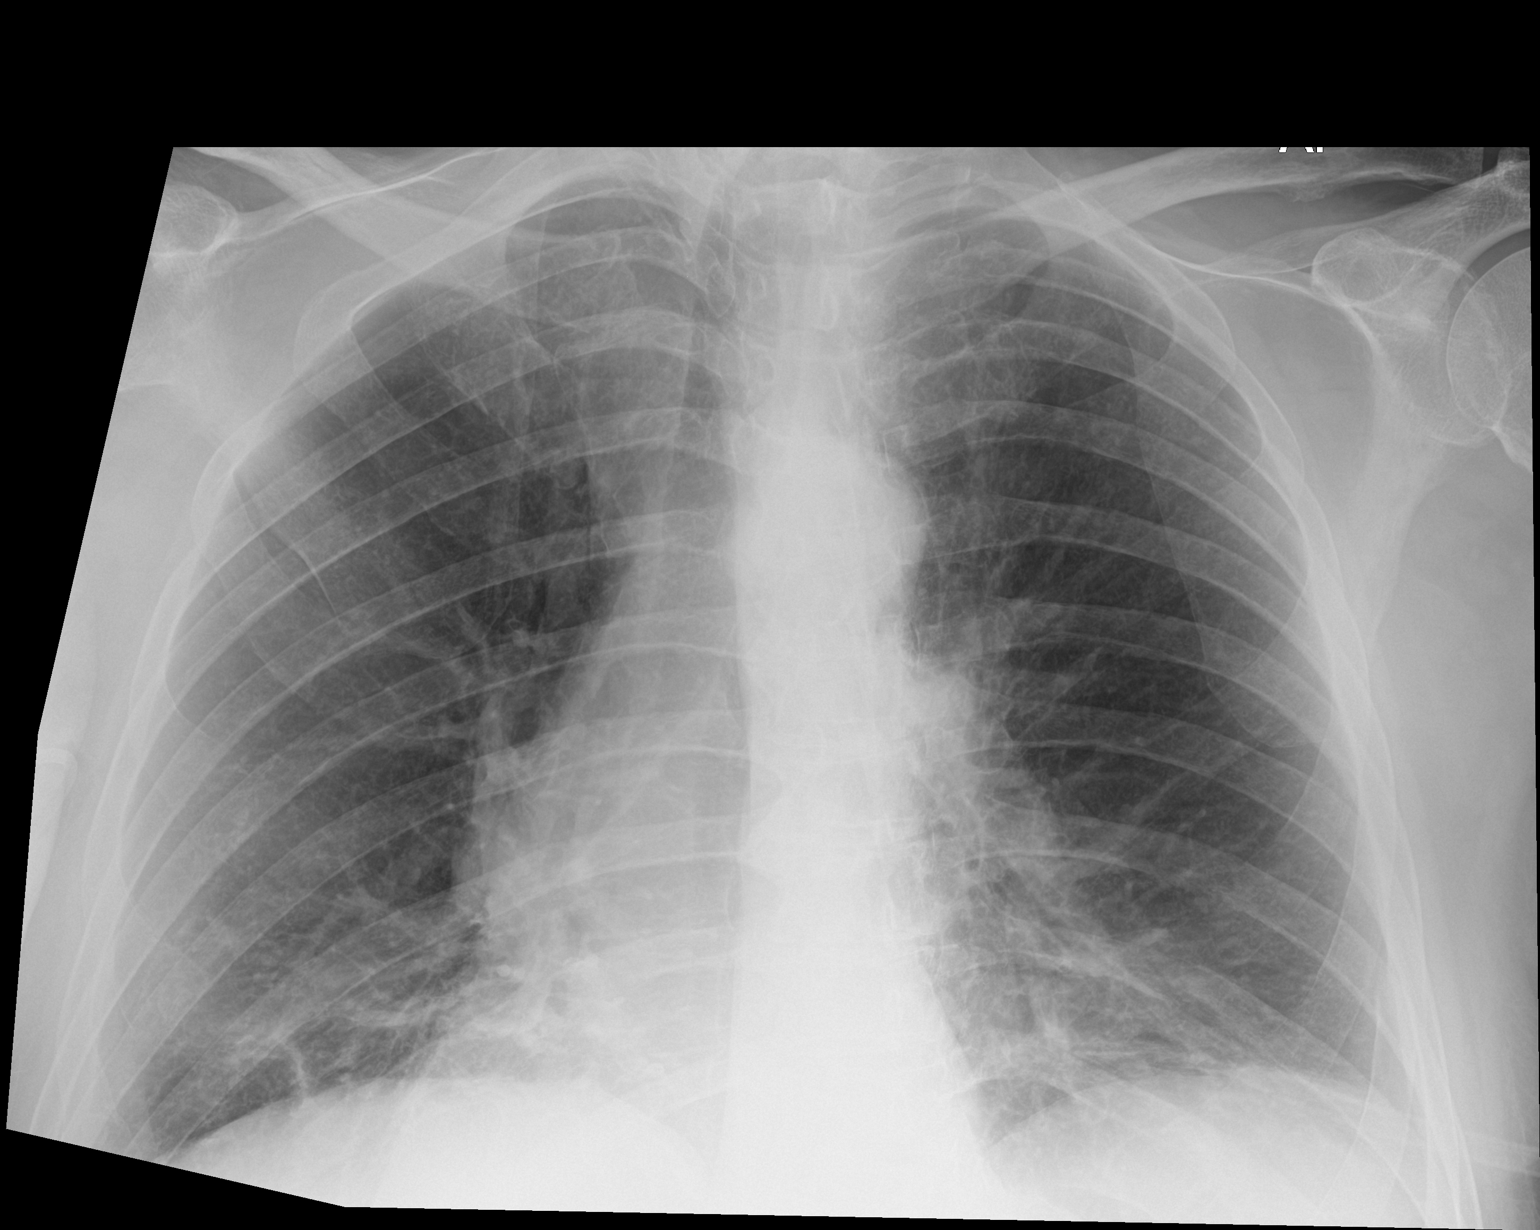

[chest ap (2 of 2)]
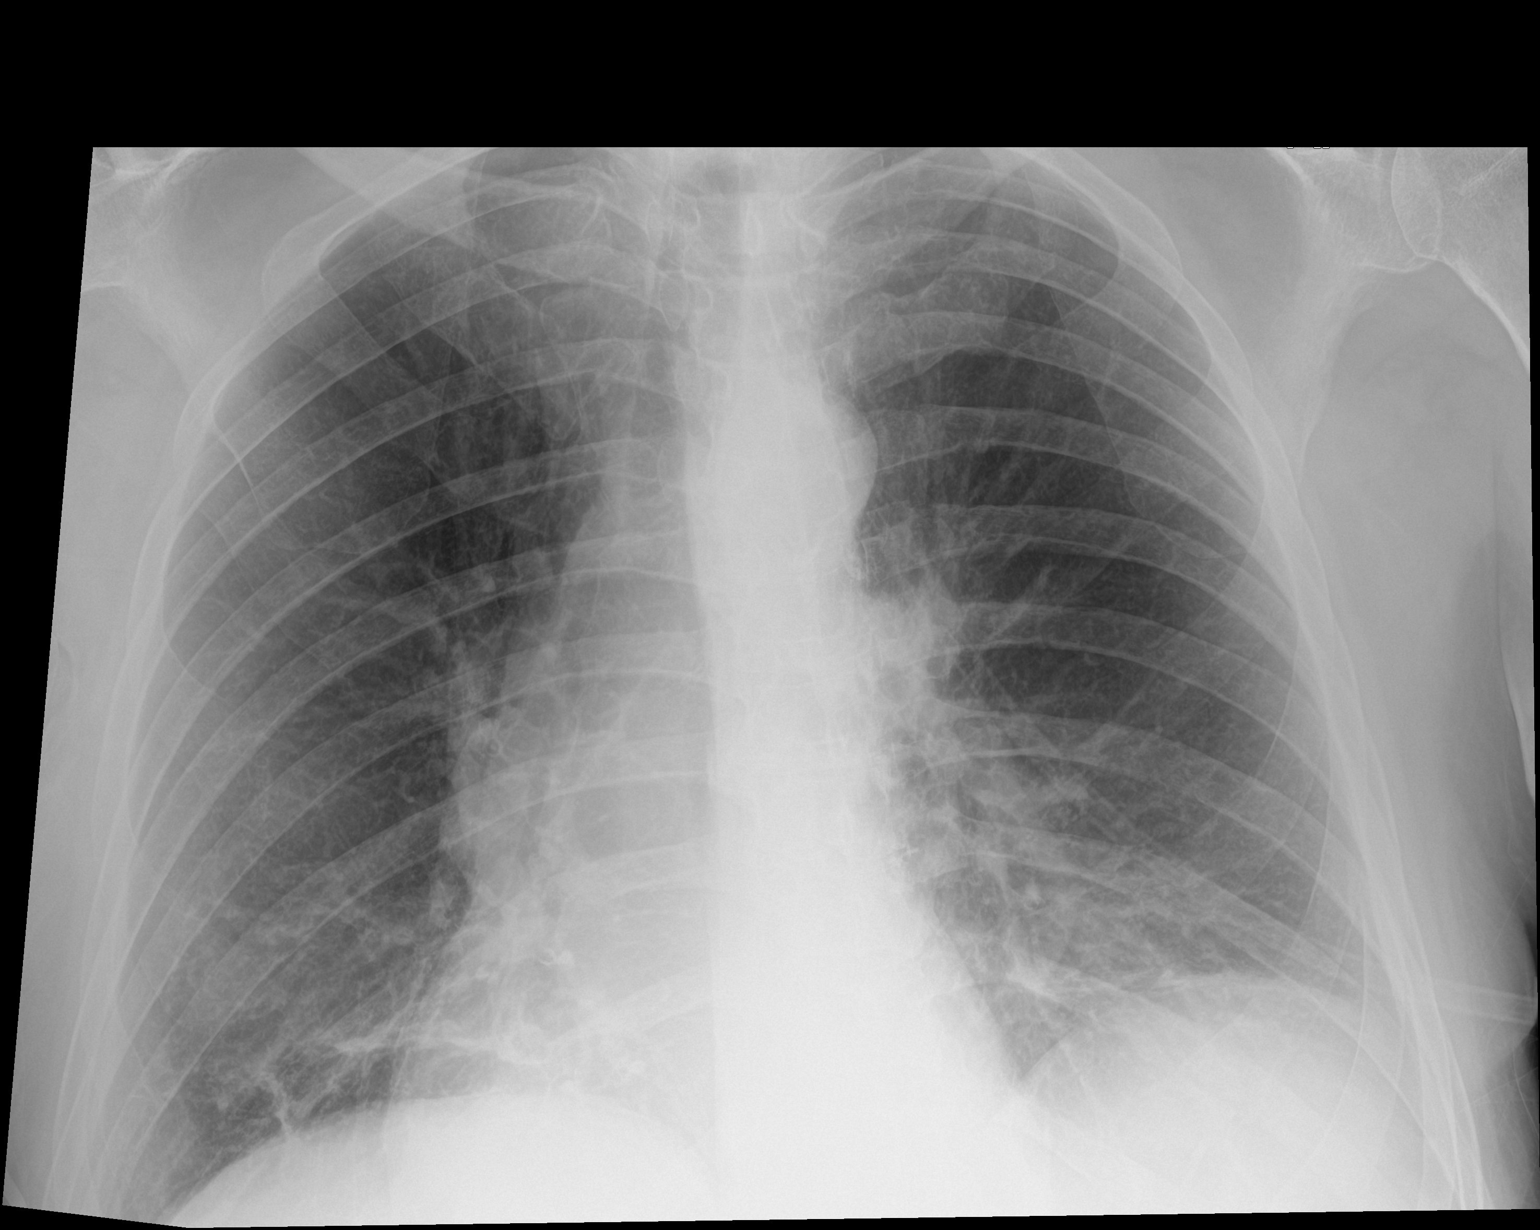

[2 of 2 positions shown; findings below may reference images not displayed]

FINDINGS: Rotated to the RIGHT.

Normal heart size, mediastinal contours, and pulmonary vascularity.

Bibasilar atelectasis.

Lungs otherwise clear.

No pleural effusion or pneumothorax.

Bones unremarkable.
IMPRESSION: Bibasilar atelectasis.

## 2018-01-08 DIAGNOSIS — I509 Heart failure, unspecified: Secondary | ICD-10-CM | POA: Diagnosis not present

## 2018-01-22 DIAGNOSIS — E119 Type 2 diabetes mellitus without complications: Secondary | ICD-10-CM | POA: Diagnosis not present

## 2018-01-22 DIAGNOSIS — Z794 Long term (current) use of insulin: Secondary | ICD-10-CM | POA: Diagnosis not present

## 2018-02-07 DIAGNOSIS — I509 Heart failure, unspecified: Secondary | ICD-10-CM | POA: Diagnosis not present

## 2018-03-10 DIAGNOSIS — I509 Heart failure, unspecified: Secondary | ICD-10-CM | POA: Diagnosis not present

## 2018-03-11 ENCOUNTER — Telehealth: Payer: Self-pay | Admitting: Family Medicine

## 2018-03-11 DIAGNOSIS — E1142 Type 2 diabetes mellitus with diabetic polyneuropathy: Secondary | ICD-10-CM

## 2018-03-11 NOTE — Telephone Encounter (Signed)
Ordered bmet and A1c, can be done prior to OV if possible.  Doesn't need to fast.   Needs OV.  Thanks.

## 2018-03-11 NOTE — Telephone Encounter (Signed)
Copied from [place] [id number]. Topic: Quick Communication - See Telephone Encounter >> Mar 11, 2018  4:42 PM Clack, Laban Emperor wrote: CRM for notification. See Telephone encounter for: 03/11/18.  Pt wife [name] would like to know if Dr. Damita Dunnings can put lab orders in. To have pt's sugar tested, she states she does not know the name of the labs.  Please f/u with pt.

## 2018-03-11 NOTE — Telephone Encounter (Signed)
Patient's wife (on [place]) notified as instructed by telephone and verbalized understanding. Appointments scheduled as instructed.

## 2018-03-11 NOTE — Telephone Encounter (Signed)
Pt seen 08/15/17; no future appt scheduled.Please advise.

## 2018-03-16 ENCOUNTER — Other Ambulatory Visit (INDEPENDENT_AMBULATORY_CARE_PROVIDER_SITE_OTHER): Payer: Medicare Other

## 2018-03-16 DIAGNOSIS — E1142 Type 2 diabetes mellitus with diabetic polyneuropathy: Secondary | ICD-10-CM | POA: Diagnosis not present

## 2018-03-16 LAB — BASIC METABOLIC PANEL
BUN: 36 mg/dL — ABNORMAL HIGH (ref 6–23)
CO2: 34 mEq/L — ABNORMAL HIGH (ref 19–32)
Calcium: 8.5 mg/dL (ref 8.4–10.5)
Chloride: 100 mEq/L (ref 96–112)
Creatinine, Ser: 1.77 mg/dL — ABNORMAL HIGH (ref 0.40–1.50)
GFR: 38.84 mL/min — ABNORMAL LOW (ref >60.00)
Glucose, Bld: 156 mg/dL — ABNORMAL HIGH (ref 70–99)
Potassium: 4.5 mEq/L (ref 3.5–5.1)
Sodium: 141 mEq/L (ref 135–145)

## 2018-03-16 LAB — HEMOGLOBIN A1C: Hgb A1c MFr Bld: 8.9 % — ABNORMAL HIGH (ref 4.6–6.5)

## 2018-03-17 ENCOUNTER — Other Ambulatory Visit: Payer: Medicare Other

## 2018-03-20 ENCOUNTER — Encounter: Payer: Self-pay | Admitting: Family Medicine

## 2018-03-20 ENCOUNTER — Ambulatory Visit (INDEPENDENT_AMBULATORY_CARE_PROVIDER_SITE_OTHER): Payer: Medicare Other | Admitting: Family Medicine

## 2018-03-20 VITALS — BP 122/62 | HR 64 | Temp 97.7°F | Ht 74.0 in | Wt 212.0 lb

## 2018-03-20 DIAGNOSIS — E1142 Type 2 diabetes mellitus with diabetic polyneuropathy: Secondary | ICD-10-CM | POA: Diagnosis not present

## 2018-03-20 DIAGNOSIS — N183 Chronic kidney disease, stage 3 unspecified: Secondary | ICD-10-CM

## 2018-03-20 DIAGNOSIS — J438 Other emphysema: Secondary | ICD-10-CM

## 2018-03-20 DIAGNOSIS — Z794 Long term (current) use of insulin: Secondary | ICD-10-CM

## 2018-03-20 MED ORDER — GABAPENTIN 100 MG PO CAPS: 100.0000 mg | ORAL_CAPSULE | Freq: Two times a day (BID) | ORAL | 3 refills | Status: DC | PRN

## 2018-03-20 MED ORDER — TIOTROPIUM BROMIDE MONOHYDRATE 18 MCG IN CAPS: 18.0000 ug | ORAL_CAPSULE | Freq: Every day | RESPIRATORY_TRACT | 12 refills | Status: DC

## 2018-03-20 NOTE — Progress Notes (Signed)
Diabetes:  Using medications without difficulties: yes Hypoglycemic episodes: rare events.   Hyperglycemic episodes:no Feet problems:see below.  Blood Sugars averaging: 115 this AM.  Higher later in the day, 160-170s.   He isn't using meal time insulin.   15 units lantus once a day.   He had been off his diet recently.   He is off pain meds except for tylenol.  "I feel good except for my feet" with neuropathy.   CKD d/w pt.  Cr stable, improved from prev elevation.  Labs d/w pt.    He is off spiriva recently.  Still on symbicort.  Rare use of neb, rare use of O2.  He is mildly SOB with exertion.  D/w pt about retrial of spiriva and update me as needed.    PMH and SH reviewed  Meds, vitals, and allergies reviewed.   ROS: Per HPI unless specifically indicated in ROS section   GEN: nad, alert and oriented HEENT: mucous membranes moist NECK: supple w/o LA CV: rrr. PULM: ctab, no inc wob ABD: soft, +bs EXT: no edema SKIN: no acute rash Cerumen removed from L ear canal w/o troubles with curette.  Recheck B TMs wnl.   Diabetic foot exam: Normal inspection except for L foot partial amputation.  No skin breakdown No calluses  Dec B DP pulses Normal sensation to light touch and monofilament Nails normal on R foot except for thick 1st nail

## 2018-03-20 NOTE — Patient Instructions (Addendum)
Restart spiriva and see if that helps your breathing.  Try taking gabapentin and see if you can tolerate a low dose and see if that helps.  Cut back on the sweets.  Update me as needed.  Plan on recheck in about 6 months with labs prior to a yearly physical.  Take care.  Glad to see you.

## 2018-03-22 NOTE — Assessment & Plan Note (Signed)
Reasonable to retry Spiriva and then continue with Symbicort concurrently.  He will update me as needed.  Lungs are clear today.

## 2018-03-22 NOTE — Assessment & Plan Note (Signed)
He isn't using meal time insulin.   15 units lantus once a day.   Labs d/w pt.  He had been off his diet recently.  That likely explains his A1c. He is off pain meds except for tylenol.  "I feel good except for my feet" with neuropathy.  Discussed with patient about trial of low-dose of gabapentin.  Previous intolerance of medication may have only been related to high doses.  Start a low dose with 100 mg/day and gradually increase as tolerated and as needed.  Rationale discussed with patient and he agrees to update me as needed.  See after visit summary.  >25 minutes spent in face to face time with patient, >50% spent in counselling or coordination of care.

## 2018-03-22 NOTE — Assessment & Plan Note (Signed)
Creatinine improved from previous elevation.  Discussed with patient.  Not on any nephrotoxic agents. Continue as is.   He agrees.

## 2018-03-26 ENCOUNTER — Encounter: Payer: Self-pay | Admitting: Family Medicine

## 2018-04-09 DIAGNOSIS — I509 Heart failure, unspecified: Secondary | ICD-10-CM | POA: Diagnosis not present

## 2018-04-20 ENCOUNTER — Other Ambulatory Visit: Payer: Self-pay | Admitting: Family Medicine

## 2018-04-24 DIAGNOSIS — Z794 Long term (current) use of insulin: Secondary | ICD-10-CM | POA: Diagnosis not present

## 2018-04-24 DIAGNOSIS — E119 Type 2 diabetes mellitus without complications: Secondary | ICD-10-CM | POA: Diagnosis not present

## 2018-05-04 ENCOUNTER — Encounter: Payer: Self-pay | Admitting: Family Medicine

## 2018-05-10 DIAGNOSIS — I509 Heart failure, unspecified: Secondary | ICD-10-CM | POA: Diagnosis not present

## 2018-05-22 DIAGNOSIS — I509 Heart failure, unspecified: Secondary | ICD-10-CM | POA: Diagnosis not present

## 2018-05-22 DIAGNOSIS — S98112D Complete traumatic amputation of left great toe, subsequent encounter: Secondary | ICD-10-CM | POA: Diagnosis not present

## 2018-05-26 ENCOUNTER — Other Ambulatory Visit: Payer: Self-pay | Admitting: Cardiovascular Disease

## 2018-05-26 ENCOUNTER — Other Ambulatory Visit: Payer: Self-pay | Admitting: Family Medicine

## 2018-06-09 DIAGNOSIS — I509 Heart failure, unspecified: Secondary | ICD-10-CM | POA: Diagnosis not present

## 2018-06-22 DIAGNOSIS — S98112D Complete traumatic amputation of left great toe, subsequent encounter: Secondary | ICD-10-CM | POA: Diagnosis not present

## 2018-06-22 DIAGNOSIS — I509 Heart failure, unspecified: Secondary | ICD-10-CM | POA: Diagnosis not present

## 2018-07-08 ENCOUNTER — Other Ambulatory Visit: Payer: Self-pay | Admitting: Cardiovascular Disease

## 2018-07-08 DIAGNOSIS — I739 Peripheral vascular disease, unspecified: Secondary | ICD-10-CM

## 2018-07-09 ENCOUNTER — Ambulatory Visit (INDEPENDENT_AMBULATORY_CARE_PROVIDER_SITE_OTHER): Payer: Medicare Other

## 2018-07-09 DIAGNOSIS — I739 Peripheral vascular disease, unspecified: Secondary | ICD-10-CM | POA: Diagnosis not present

## 2018-07-10 ENCOUNTER — Telehealth: Payer: Self-pay | Admitting: *Deleted

## 2018-07-10 DIAGNOSIS — I739 Peripheral vascular disease, unspecified: Secondary | ICD-10-CM

## 2018-07-10 DIAGNOSIS — I509 Heart failure, unspecified: Secondary | ICD-10-CM | POA: Diagnosis not present

## 2018-07-10 NOTE — Telephone Encounter (Signed)
-----   Message from Wellington Hampshire, MD sent at 07/10/2018  1:29 PM EDT ----- Inform patient that vascular studies showed patent vessels in the left leg with only slightly reduced ABI.  Continue same medications and repeat studies in 1 year.

## 2018-07-10 NOTE — Telephone Encounter (Signed)
Patient made aware of results and verbalized understanding. Repeat studies ordered.

## 2018-07-14 ENCOUNTER — Other Ambulatory Visit: Payer: Self-pay | Admitting: Cardiovascular Disease

## 2018-07-23 DIAGNOSIS — S98112D Complete traumatic amputation of left great toe, subsequent encounter: Secondary | ICD-10-CM | POA: Diagnosis not present

## 2018-07-23 DIAGNOSIS — I509 Heart failure, unspecified: Secondary | ICD-10-CM | POA: Diagnosis not present

## 2018-07-26 ENCOUNTER — Other Ambulatory Visit: Payer: Self-pay | Admitting: Cardiovascular Disease

## 2018-08-03 DIAGNOSIS — E119 Type 2 diabetes mellitus without complications: Secondary | ICD-10-CM | POA: Diagnosis not present

## 2018-08-03 DIAGNOSIS — Z794 Long term (current) use of insulin: Secondary | ICD-10-CM | POA: Diagnosis not present

## 2018-08-10 DIAGNOSIS — I509 Heart failure, unspecified: Secondary | ICD-10-CM | POA: Diagnosis not present

## 2018-08-13 ENCOUNTER — Ambulatory Visit (INDEPENDENT_AMBULATORY_CARE_PROVIDER_SITE_OTHER): Payer: Medicare Other

## 2018-08-13 DIAGNOSIS — Z23 Encounter for immunization: Secondary | ICD-10-CM | POA: Diagnosis not present

## 2018-08-22 DIAGNOSIS — S98112D Complete traumatic amputation of left great toe, subsequent encounter: Secondary | ICD-10-CM | POA: Diagnosis not present

## 2018-08-22 DIAGNOSIS — I509 Heart failure, unspecified: Secondary | ICD-10-CM | POA: Diagnosis not present

## 2018-09-09 DIAGNOSIS — I509 Heart failure, unspecified: Secondary | ICD-10-CM | POA: Diagnosis not present

## 2018-09-17 ENCOUNTER — Telehealth: Payer: Self-pay | Admitting: Family Medicine

## 2018-09-17 DIAGNOSIS — Z794 Long term (current) use of insulin: Secondary | ICD-10-CM

## 2018-09-17 DIAGNOSIS — E1142 Type 2 diabetes mellitus with diabetic polyneuropathy: Secondary | ICD-10-CM

## 2018-09-17 NOTE — Telephone Encounter (Signed)
Pt's wife([name]) is requesting to get cholesterol lab when pt comes in for his 3 month labs on 11/12.

## 2018-09-18 NOTE — Telephone Encounter (Signed)
Ordered. Thanks

## 2018-09-22 ENCOUNTER — Other Ambulatory Visit (INDEPENDENT_AMBULATORY_CARE_PROVIDER_SITE_OTHER): Payer: Medicare Other

## 2018-09-22 DIAGNOSIS — E1142 Type 2 diabetes mellitus with diabetic polyneuropathy: Secondary | ICD-10-CM

## 2018-09-22 DIAGNOSIS — S98112D Complete traumatic amputation of left great toe, subsequent encounter: Secondary | ICD-10-CM | POA: Diagnosis not present

## 2018-09-22 DIAGNOSIS — Z794 Long term (current) use of insulin: Secondary | ICD-10-CM

## 2018-09-22 DIAGNOSIS — I509 Heart failure, unspecified: Secondary | ICD-10-CM | POA: Diagnosis not present

## 2018-09-22 LAB — COMPREHENSIVE METABOLIC PANEL
ALT: 9 U/L (ref 0–53)
AST: 11 U/L (ref 0–37)
Albumin: 4 g/dL (ref 3.5–5.2)
Alkaline Phosphatase: 55 U/L (ref 39–117)
BUN: 32 mg/dL — ABNORMAL HIGH (ref 6–23)
CO2: 36 mEq/L — ABNORMAL HIGH (ref 19–32)
Calcium: 8.9 mg/dL (ref 8.4–10.5)
Chloride: 97 mEq/L (ref 96–112)
Creatinine, Ser: 1.82 mg/dL — ABNORMAL HIGH (ref 0.40–1.50)
GFR: 37.56 mL/min — ABNORMAL LOW (ref >60.00)
Glucose, Bld: 122 mg/dL — ABNORMAL HIGH (ref 70–99)
Potassium: 4.3 mEq/L (ref 3.5–5.1)
Sodium: 140 mEq/L (ref 135–145)
Total Bilirubin: 0.6 mg/dL (ref 0.2–1.2)
Total Protein: 7.1 g/dL (ref 6.0–8.3)

## 2018-09-22 LAB — LIPID PANEL
Cholesterol: 118 mg/dL (ref 0–200)
HDL: 38 mg/dL — ABNORMAL LOW (ref >39.00)
LDL Cholesterol: 61 mg/dL (ref 0–99)
NonHDL: 80.42
Total CHOL/HDL Ratio: 3
Triglycerides: 96 mg/dL (ref 0.0–149.0)
VLDL: 19.2 mg/dL (ref 0.0–40.0)

## 2018-09-22 LAB — HEMOGLOBIN A1C: Hgb A1c MFr Bld: 8.8 % — ABNORMAL HIGH (ref 4.6–6.5)

## 2018-09-25 ENCOUNTER — Ambulatory Visit (INDEPENDENT_AMBULATORY_CARE_PROVIDER_SITE_OTHER): Payer: Medicare Other | Admitting: Family Medicine

## 2018-09-25 ENCOUNTER — Ambulatory Visit (INDEPENDENT_AMBULATORY_CARE_PROVIDER_SITE_OTHER)
Admission: RE | Admit: 2018-09-25 | Discharge: 2018-09-25 | Disposition: A | Discharge status: Home / Self Care | Payer: Medicare Other | Source: Ambulatory Visit | Attending: Family Medicine | Admitting: Family Medicine

## 2018-09-25 ENCOUNTER — Encounter: Payer: Self-pay | Admitting: Family Medicine

## 2018-09-25 VITALS — BP 124/72 | HR 60 | Temp 97.8°F | Wt 210.0 lb

## 2018-09-25 DIAGNOSIS — I5032 Chronic diastolic (congestive) heart failure: Secondary | ICD-10-CM

## 2018-09-25 DIAGNOSIS — N183 Chronic kidney disease, stage 3 unspecified: Secondary | ICD-10-CM

## 2018-09-25 DIAGNOSIS — R0902 Hypoxemia: Secondary | ICD-10-CM | POA: Diagnosis not present

## 2018-09-25 DIAGNOSIS — R0602 Shortness of breath: Secondary | ICD-10-CM

## 2018-09-25 DIAGNOSIS — Z794 Long term (current) use of insulin: Secondary | ICD-10-CM

## 2018-09-25 DIAGNOSIS — E785 Hyperlipidemia, unspecified: Secondary | ICD-10-CM

## 2018-09-25 DIAGNOSIS — E1142 Type 2 diabetes mellitus with diabetic polyneuropathy: Secondary | ICD-10-CM

## 2018-09-25 LAB — CBC WITH DIFFERENTIAL/PLATELET
Basophils Absolute: 0.1 10*3/uL (ref 0.0–0.1)
Basophils Relative: 0.7 % (ref 0.0–3.0)
Eosinophils Absolute: 0.2 10*3/uL (ref 0.0–0.7)
Eosinophils Relative: 2.1 % (ref 0.0–5.0)
HCT: 49.3 % (ref 39.0–52.0)
Hemoglobin: 16.2 g/dL (ref 13.0–17.0)
Lymphocytes Relative: 17.1 % (ref 12.0–46.0)
Lymphs Abs: 1.4 10*3/uL (ref 0.7–4.0)
MCHC: 32.9 g/dL (ref 30.0–36.0)
MCV: 99.3 fl (ref 78.0–100.0)
Monocytes Absolute: 0.7 10*3/uL (ref 0.1–1.0)
Monocytes Relative: 9.2 % (ref 3.0–12.0)
Neutro Abs: 5.8 10*3/uL (ref 1.4–7.7)
Neutrophils Relative %: 70.9 % (ref 43.0–77.0)
Platelets: 239 10*3/uL (ref 150.0–400.0)
RBC: 4.96 Mil/uL (ref 4.22–5.81)
RDW: 13.5 % (ref 11.5–15.5)
WBC: 8.1 10*3/uL (ref 4.0–10.5)

## 2018-09-25 LAB — BRAIN NATRIURETIC PEPTIDE: Pro B Natriuretic peptide (BNP): 72 pg/mL (ref 0.0–100.0)

## 2018-09-25 MED ORDER — TIOTROPIUM BROMIDE MONOHYDRATE 18 MCG IN CAPS: 18.0000 ug | ORAL_CAPSULE | Freq: Every day | RESPIRATORY_TRACT | 12 refills | Status: DC

## 2018-09-25 MED ORDER — ATORVASTATIN CALCIUM 10 MG PO TABS: 10.0000 mg | ORAL_TABLET | Freq: Every day | ORAL | 3 refills | Status: DC

## 2018-09-25 NOTE — Progress Notes (Signed)
Diabetes:  Using medications without difficulties:yes Hypoglycemic episodes: no recent issues, cautions d/w pt.   Hyperglycemic episodes:no Feet problems: still with burning in the feet.  Blood Sugars averaging: 70-150 eye exam within last year: due, d/w pt.   Labs d/w pt.   Goal to avoid hypoglycemia.   CKD stable.  Cr d/w pt. his creatinine has been notably higher in the past.  Discussed.  Elevated Cholesterol: Using medications without problems: yes Muscle aches: some occ aches, not continual.   Diet compliance: yes Exercise: limited by SOB etc.  Walking with cane.   No falls in the last few months.  Cautions d/w pt.   Encouraged cards f/u.   H/o AF. Recheck pulse 60.   He has used O2 more often in the meantime over the last week, not continual use.  Still using inhalers at baseline.  He is occ SOB.  Hasn't seen Dr. Lamonte Sakai recently with pulmonary.  Meds, vitals, and allergies reviewed.   ROS: Per HPI unless specifically indicated in ROS section   GEN: nad, alert and oriented HEENT: mucous membranes moist NECK: supple w/o LA CV: IRR, not tachy PULM: ctab, no inc wob ABD: soft, +bs EXT: no edema SKIN: no acute rash  Diabetic foot exam: Normal inspection except for L foot distal amputation.   No skin breakdown No calluses  Dec DP pulses Normal sensation to light touch and monofilament Nails normal

## 2018-09-25 NOTE — Patient Instructions (Addendum)
Go to the lab on the way out.  We'll contact you with your lab and xray report. Take care.  Glad to see you.  Don't change your meds for now.   Call cardiology about follow up.

## 2018-09-27 NOTE — Assessment & Plan Note (Signed)
Creatinine has been worse previously.  Is reasonable at this point.  Continue current medications as is.  He agrees.

## 2018-09-27 NOTE — Assessment & Plan Note (Signed)
Reasonable to continue statin for now.  Not likely having aches from statin.  He agrees.  See above.

## 2018-09-27 NOTE — Assessment & Plan Note (Signed)
He is use supplemental oxygen more often in the last week.  Not continual use.  We talked about options.  Reasonable to check chest x-ray and follow-up labs today.  Still okay for outpatient follow-up.  I want him to follow-up with cardiology.  If he continues to have troubles and his x-ray/labs along with cardiology office visit are not providing enough information, then it may be reasonable for him to see pulmonary again.  Discussed.  He agrees.

## 2018-09-27 NOTE — Assessment & Plan Note (Signed)
He does not appear to be in overt failure.  See above.  Encourage cardiology follow-up.

## 2018-09-27 NOTE — Assessment & Plan Note (Signed)
Given his age and the risk of hypoglycemia, A1c in the eights is likely reasonable.  Discussed with patient.  Continue work on diet.  No change in meds at this point.  He agrees.  Encouraged eye clinic follow-up.  He is putting up with his foot discomfort from neuropathy.  Foot care discussed with patient.>25 minutes spent in face to face time with patient, >50% spent in counselling or coordination of care.

## 2018-09-29 ENCOUNTER — Encounter: Payer: Self-pay | Admitting: Cardiovascular Disease

## 2018-09-29 ENCOUNTER — Ambulatory Visit: Payer: Medicare Other | Admitting: Cardiovascular Disease

## 2018-09-29 VITALS — BP 126/64 | HR 69 | Ht 74.0 in | Wt 211.0 lb

## 2018-09-29 DIAGNOSIS — I10 Essential (primary) hypertension: Secondary | ICD-10-CM

## 2018-09-29 DIAGNOSIS — E785 Hyperlipidemia, unspecified: Secondary | ICD-10-CM

## 2018-09-29 DIAGNOSIS — I739 Peripheral vascular disease, unspecified: Secondary | ICD-10-CM

## 2018-09-29 NOTE — Progress Notes (Signed)
Cardiology Office Note   Date:  09/29/2018   ID:  [name] Small, DOB 01-14-1928, MRN [id number]  PCP:  Tonia Ghent, MD  Cardiologist:   Dr. Shanda Howells  Chief Complaint  Patient presents with  . Other    Past due 6 month follow up. Patient denies chest pain and SOB. meds reviewed verbally with patient.       History of Present Illness: [name] Small is a 84 y.o. male who presents for a follow up visit regarding peripheral arterial disease. The patient has known history of mild nonobstructive coronary artery disease,Chronic kidney disease, hypertension and diabetes. He quit smoking more than 40 years ago. He had bladder cancer in 2012 treated with chemotherapy. He had critical limb ischemia with nonhealing ulcer on the left foot early 2017. Lower extremity angiography in April, 2017 showed an occluded distal left popliteal artery with reconstitution via extensive collaterals to the proximal to mid peroneal artery which was the only patent vessel below the knee.  I performed successful angioplasty to the popliteal and peroneal artery . The patient continued to have to infections and ultimately underwent amputation of all the toes on the left side. The amputation site has healed completely.    He underwent left hip replacement in February 4098 without complication.  He has been doing well with no chest pain, shortness of breath or palpitations.  He has mild foot pain with walking but no calf claudication.  No lower extremity ulceration.  Past Medical History:  Diagnosis Date  . Arthritis   . Back pain    s/p lumbar injection 2014  . BENIGN PROSTATIC HYPERTROPHY 05/21/2007  . Bladder cancer ([place]) 10/2011  . CAD (coronary artery disease) CARDIOLOGIST- DR WALL - VISIT IN JUNE 2012 FOR CATH   nonobstructive by cath 6/12:  mid LAD 30%, proximal obtuse marginal-2 30%, proximal RCA 20%, mid RCA 30-40%.  He had normal cardiac output and mildly elevated filling pressures but no  significant pulmonary hypertension;   Echocardiogram in May 2012 demonstrated EF 50-55% and left atrial enlargement   . Cellulitis of left foot    Hospitalized in 2006  . CHF (congestive heart failure) ([place])    per family, issues in 2012  . Chronic kidney disease (CKD), stage III (moderate) ([place]) 12/04/2007   FOLLOWED BY DR PATEL  . Chronic pain of lower extremity   . Complication of anesthesia    1 time bladder cancer surgery- 2012 , oxygen satruratioon dropped had to stay overnight, no problems since then.  Marland Kitchen COPD (chronic obstructive pulmonary disease) ([place])   . DIABETES MELLITUS, TYPE II 05/21/2007  . DIVERTICULOSIS, COLON 05/21/2007   pt denies  . Dyspnea    with exertion, patient mointors saturation  . Fatigue AGE-RELATED  . Gross hematuria    pt denies  . HYPERTENSION 05/21/2007  . Impaired hearing BILATERAL HEARING AIDS  . On home oxygen therapy    "2L at nighttime" (02/28/2016)  . PAD (peripheral artery disease) ([place])   . Pneumonia    Hx  of   . Psoriasis ELBOWS  . Psoriasis   . PSORIASIS, SCALP 10/25/2008    Past Surgical History:  Procedure Laterality Date  . AMPUTATION Left 03/27/2016   Procedure: partial first AMPUTATION RAY; LEFT;  Surgeon: Trula Slade, DPM;  Location: [facility];  Service: Podiatry;  Laterality: Left;  . AMPUTATION Left 06/12/2016   Procedure: TRANSMETATARSAL AMPUTATION LEFT FOOT;  Surgeon: Newt Minion, MD;  Location: [facility]  OR;  Service: Orthopedics;  Laterality: Left;  . APPENDECTOMY  1941  . CARDIAC CATHETERIZATION  04-24-11/  DR Rogue Jury Elliannah Wayment   MILD NONOBSTRUCTIVE CAD, NORMA CARDIAC OUTPUT  . CARDIOVASCULAR STRESS TEST  2007  . CATARACT EXTRACTION W/ INTRAOCULAR LENS  IMPLANT, BILATERAL Bilateral ~ 2010  . CYSTOSCOPY  12/25/2011   Procedure: CYSTOSCOPY;  Surgeon: Molli Hazard, MD;  Location: [facility];  Service: Urology;  Laterality: N/A;  needs intubation and to be paralyzed   . CYSTOSCOPY W/ RETROGRADES  10/30/2011     Procedure: CYSTOSCOPY WITH RETROGRADE PYELOGRAM;  Surgeon: Molli Hazard, MD;  Location: [facility];  Service: Urology;  Laterality: Bilateral;  CYSTOSCOPY POSS TURBT BILATERAL RETROGRADE PYLEOGRAM   . CYSTOSCOPY W/ RETROGRADES  11/30/2012   Procedure: CYSTOSCOPY WITH RETROGRADE PYELOGRAM;  Surgeon: Molli Hazard, MD;  Location: [facility];  Service: Urology;  Laterality: Bilateral;  Flexible cystoscopy.  . CYSTOSCOPY WITH BIOPSY  11/30/2012   Procedure: CYSTOSCOPY WITH BIOPSY;  Surgeon: Molli Hazard, MD;  Location: [facility];  Service: Urology;  Laterality: N/A;  . INCISION AND DRAINAGE FOOT Left ~ 2005   LEFT FOOT DUE TO INFECTION FROM  NAIL PUNCTURE INJURY  . LUMBAR LAMINECTOMY/DECOMPRESSION MICRODISCECTOMY N/A 10/18/2016   Procedure: LEFT AND CENTRAL L4-5 LUMBAR LAMINECTOMY WITH RESECTION OF SYNOVIAL CYST;  Surgeon: Jessy Oto, MD;  Location: [place];  Service: Orthopedics;  Laterality: N/A;  . PENILE PROSTHESIS IMPLANT  1990s  . PERIPHERAL VASCULAR CATHETERIZATION N/A 02/14/2016   Procedure: Abdominal Aortogram w/Lower Extremity;  Surgeon: Wellington Hampshire, MD;  Location: [place] [facility];  Service: Cardiovascular;  Laterality: N/A;  . PERIPHERAL VASCULAR CATHETERIZATION Left 02/28/2016   Procedure: Peripheral Vascular Balloon Angioplasty;  Surgeon: Wellington Hampshire, MD;  Location: [place] [facility];  Service: Cardiovascular;  Laterality: Left;  left peroneal and popliteal artery  . [place]  . TOTAL HIP ARTHROPLASTY Left 12/27/2016   Procedure: LEFT TOTAL HIP ARTHROPLASTY ANTERIOR APPROACH;  Surgeon: Mcarthur Rossetti, MD;  Location: [facility];  Service: Orthopedics;  Laterality: Left;  . TRANSURETHRAL RESECTION OF BLADDER TUMOR  10/30/2011   Procedure: TRANSURETHRAL RESECTION OF BLADDER TUMOR (TURBT);  Surgeon: Molli Hazard, MD;  Location[facility];  Service:  Urology;  Laterality: N/A;  . TRANSURETHRAL RESECTION OF BLADDER TUMOR  12/25/2011   Procedure: TRANSURETHRAL RESECTION OF BLADDER TUMOR (TURBT);  Surgeon: Molli Hazard, MD;  Location: [facility];  Service: Urology;  Laterality: N/A;  need long gyrus instruments   . VASECTOMY  1990s     Current Outpatient Medications  Medication Sig Dispense Refill  . acetaminophen (TYLENOL) 500 MG tablet Take 1,000 mg by mouth every 8 (eight) hours as needed for mild pain, moderate pain or headache.    Marland Kitchen amLODipine (NORVASC) 2.5 MG tablet TAKE 1 TABLET BY MOUTH  DAILY 90 tablet 0  . Apremilast 30 MG TABS Take 1 tablet by mouth 2 (two) times daily.    Marland Kitchen atorvastatin (LIPITOR) 10 MG tablet Take 1 tablet (10 mg total) by mouth daily. 90 tablet 3  . budesonide-formoterol (SYMBICORT) 160-4.5 MCG/ACT inhaler Inhale 2 puffs into the lungs 2 (two) times daily as needed (shortness of breath).     . clobetasol (TEMOVATE) 0.05 % external solution Apply 1 application topically daily.    . clopidogrel (PLAVIX) 75 MG tablet TAKE 1 TABLET BY MOUTH  DAILY 90 tablet 1  .  furosemide (LASIX) 40 MG tablet TAKE 1 TABLET BY MOUTH  EVERY MONDAY, WEDNESDAY,  AND FRIDAY 39 tablet 5  . glipiZIDE (GLUCOTROL XL) 5 MG 24 hr tablet TAKE 1 TABLET BY MOUTH  DAILY WITH BREAKFAST 90 tablet 3  . insulin glargine (LANTUS) 100 UNIT/ML injection Inject 0.15 mLs (15 Units total) into the skin daily. 10 mL   . lidocaine (XYLOCAINE) 5 % ointment Apply 1 application topically daily as needed.    . tiotropium (SPIRIVA) 18 MCG inhalation capsule Place 1 capsule (18 mcg total) into inhaler and inhale daily. 30 capsule 12   No current facility-administered medications for this visit.     Allergies:   Actos [pioglitazone hydrochloride]; Aspirin; Ace inhibitors; Bactrim; Gabapentin; Lyrica [pregabalin]; Pravastatin; and Sulfa drugs cross reactors    Social History:  The patient  reports that he quit smoking about 49 years ago.  His smoking use included cigarettes. He has a 44.00 pack-year smoking history. He has never used smokeless tobacco. He reports that he does not drink alcohol or use drugs.   Family History:  The patient's family history includes Diabetes in his mother; Heart disease in his brother, brother, brother, brother, brother, and brother; Lung cancer in his brother.    ROS:  Please see the history of present illness.   Otherwise, review of systems are positive for none.   All other systems are reviewed and negative.    PHYSICAL EXAM: VS:  BP 126/64 (BP Location: Right Arm, Patient Position: Sitting, Cuff Size: Normal)   Pulse 69   Ht 6\' 2"  (1.88 m)   Wt 211 lb (95.7 kg)   BMI 27.09 kg/m  , BMI Body mass index is 27.09 kg/m. GEN: Well nourished, well developed, in no acute distress  HEENT: normal  Neck: no JVD, carotid bruits, or masses Cardiac: RRR; no murmurs, rubs, or gallops,no edema  Respiratory:  clear to auscultation bilaterally, normal work of breathing GI: soft, nontender, nondistended, + BS MS: no deformity or atrophy  Skin: warm and dry, no rash Neuro:  Strength and sensation are intact Psych: euthymic mood, full affect Vascular: Femoral pulses are normal bilaterally.   EKG:  EKG is ordered today. EKG showed normal sinus rhythm with no significant ST or T wave changes.  Recent Labs: 09/22/2018: ALT 9; BUN 32; Creatinine, Ser 1.82; Potassium 4.3; Sodium 140 09/25/2018: Hemoglobin 16.2; Platelets 239.0; Pro B Natriuretic peptide (BNP) 72.0    Lipid Panel    Component Value Date/Time   CHOL 118 09/22/2018 0839   TRIG 96.0 09/22/2018 0839   HDL 38.00 (L) 09/22/2018 0839   CHOLHDL 3 09/22/2018 0839   VLDL 19.2 09/22/2018 0839   LDLCALC 61 09/22/2018 0839   LDLDIRECT 147.6 04/27/2009 1012      Wt Readings from Last 3 Encounters:  09/29/18 211 lb (95.7 kg)  09/25/18 210 lb (95.3 kg)  03/20/18 212 lb (96.2 kg)       ASSESSMENT AND PLAN:   1. Peripheral arterial  disease:   He is status post successful angioplasty to the left popliteal and peroneal artery.  No evidence of recurrent ulceration .  He has mild discomfort in both feet with walking but overall no calf claudication.  Continue medical therapy. Vascular studies in August showed patent left popliteal and peroneal arteries.  2. Hyperlipidemia: Continue treatment with atorvastatin.  Most recent LDL was 61.  3. Chronic kidney disease: Renal function has been stable.  Most recent creatinine was 1.82.  4. Essential hypertension: Blood  pressure is controlled on current medications.   Disposition:   FU with me in 6 months  Signed,  [name], MD  09/29/2018 3:13 PM    [place]

## 2018-09-29 NOTE — Patient Instructions (Signed)

## 2018-10-10 DIAGNOSIS — I509 Heart failure, unspecified: Secondary | ICD-10-CM | POA: Diagnosis not present

## 2018-10-22 DIAGNOSIS — I509 Heart failure, unspecified: Secondary | ICD-10-CM | POA: Diagnosis not present

## 2018-10-22 DIAGNOSIS — S98112D Complete traumatic amputation of left great toe, subsequent encounter: Secondary | ICD-10-CM | POA: Diagnosis not present

## 2018-10-26 ENCOUNTER — Other Ambulatory Visit: Payer: Self-pay | Admitting: Cardiovascular Disease

## 2018-10-26 ENCOUNTER — Other Ambulatory Visit: Payer: Self-pay | Admitting: Family Medicine

## 2018-11-01 ENCOUNTER — Other Ambulatory Visit: Payer: Self-pay | Admitting: Family Medicine

## 2018-11-02 DIAGNOSIS — Z794 Long term (current) use of insulin: Secondary | ICD-10-CM | POA: Diagnosis not present

## 2018-11-02 DIAGNOSIS — E119 Type 2 diabetes mellitus without complications: Secondary | ICD-10-CM | POA: Diagnosis not present

## 2018-11-09 DIAGNOSIS — I509 Heart failure, unspecified: Secondary | ICD-10-CM | POA: Diagnosis not present

## 2018-11-22 DIAGNOSIS — S98112D Complete traumatic amputation of left great toe, subsequent encounter: Secondary | ICD-10-CM | POA: Diagnosis not present

## 2018-11-22 DIAGNOSIS — I509 Heart failure, unspecified: Secondary | ICD-10-CM | POA: Diagnosis not present

## 2018-12-10 DIAGNOSIS — I509 Heart failure, unspecified: Secondary | ICD-10-CM | POA: Diagnosis not present

## 2018-12-23 DIAGNOSIS — S98112D Complete traumatic amputation of left great toe, subsequent encounter: Secondary | ICD-10-CM | POA: Diagnosis not present

## 2018-12-23 DIAGNOSIS — I509 Heart failure, unspecified: Secondary | ICD-10-CM | POA: Diagnosis not present

## 2019-01-09 DIAGNOSIS — I509 Heart failure, unspecified: Secondary | ICD-10-CM | POA: Diagnosis not present

## 2019-01-21 DIAGNOSIS — R062 Wheezing: Secondary | ICD-10-CM | POA: Diagnosis not present

## 2019-01-21 DIAGNOSIS — I509 Heart failure, unspecified: Secondary | ICD-10-CM | POA: Diagnosis not present

## 2019-01-21 DIAGNOSIS — J449 Chronic obstructive pulmonary disease, unspecified: Secondary | ICD-10-CM | POA: Diagnosis not present

## 2019-01-21 DIAGNOSIS — J9600 Acute respiratory failure, unspecified whether with hypoxia or hypercapnia: Secondary | ICD-10-CM | POA: Diagnosis not present

## 2019-02-01 DIAGNOSIS — Z794 Long term (current) use of insulin: Secondary | ICD-10-CM | POA: Diagnosis not present

## 2019-02-01 DIAGNOSIS — E119 Type 2 diabetes mellitus without complications: Secondary | ICD-10-CM | POA: Diagnosis not present

## 2019-02-06 ENCOUNTER — Other Ambulatory Visit: Payer: Self-pay | Admitting: Cardiovascular Disease

## 2019-02-08 DIAGNOSIS — I509 Heart failure, unspecified: Secondary | ICD-10-CM | POA: Diagnosis not present

## 2019-02-08 DIAGNOSIS — J449 Chronic obstructive pulmonary disease, unspecified: Secondary | ICD-10-CM | POA: Diagnosis not present

## 2019-02-08 DIAGNOSIS — R062 Wheezing: Secondary | ICD-10-CM | POA: Diagnosis not present

## 2019-02-08 DIAGNOSIS — J9600 Acute respiratory failure, unspecified whether with hypoxia or hypercapnia: Secondary | ICD-10-CM | POA: Diagnosis not present

## 2019-02-19 ENCOUNTER — Other Ambulatory Visit: Payer: Self-pay | Admitting: Family Medicine

## 2019-02-21 DIAGNOSIS — J9600 Acute respiratory failure, unspecified whether with hypoxia or hypercapnia: Secondary | ICD-10-CM | POA: Diagnosis not present

## 2019-02-21 DIAGNOSIS — I509 Heart failure, unspecified: Secondary | ICD-10-CM | POA: Diagnosis not present

## 2019-02-21 DIAGNOSIS — R062 Wheezing: Secondary | ICD-10-CM | POA: Diagnosis not present

## 2019-02-21 DIAGNOSIS — J449 Chronic obstructive pulmonary disease, unspecified: Secondary | ICD-10-CM | POA: Diagnosis not present

## 2019-02-24 ENCOUNTER — Other Ambulatory Visit: Payer: Self-pay | Admitting: *Deleted

## 2019-02-24 MED ORDER — CLOPIDOGREL BISULFATE 75 MG PO TABS: 75.0000 mg | ORAL_TABLET | Freq: Every day | ORAL | 0 refills | Status: DC

## 2019-02-24 MED ORDER — GLIPIZIDE ER 5 MG PO TB24: 5.0000 mg | ORAL_TABLET | Freq: Every day | ORAL | 0 refills | Status: DC

## 2019-03-01 ENCOUNTER — Other Ambulatory Visit: Payer: Self-pay | Admitting: Family Medicine

## 2019-03-01 DIAGNOSIS — E1142 Type 2 diabetes mellitus with diabetic polyneuropathy: Secondary | ICD-10-CM

## 2019-03-01 DIAGNOSIS — Z794 Long term (current) use of insulin: Secondary | ICD-10-CM

## 2019-03-11 DIAGNOSIS — R062 Wheezing: Secondary | ICD-10-CM | POA: Diagnosis not present

## 2019-03-11 DIAGNOSIS — J449 Chronic obstructive pulmonary disease, unspecified: Secondary | ICD-10-CM | POA: Diagnosis not present

## 2019-03-11 DIAGNOSIS — I509 Heart failure, unspecified: Secondary | ICD-10-CM | POA: Diagnosis not present

## 2019-03-11 DIAGNOSIS — J9600 Acute respiratory failure, unspecified whether with hypoxia or hypercapnia: Secondary | ICD-10-CM | POA: Diagnosis not present

## 2019-03-15 ENCOUNTER — Other Ambulatory Visit: Payer: Self-pay

## 2019-03-15 ENCOUNTER — Other Ambulatory Visit (INDEPENDENT_AMBULATORY_CARE_PROVIDER_SITE_OTHER): Payer: Medicare Other

## 2019-03-15 DIAGNOSIS — Z794 Long term (current) use of insulin: Secondary | ICD-10-CM

## 2019-03-15 DIAGNOSIS — E1142 Type 2 diabetes mellitus with diabetic polyneuropathy: Secondary | ICD-10-CM

## 2019-03-15 LAB — BASIC METABOLIC PANEL
BUN: 47 mg/dL — ABNORMAL HIGH (ref 6–23)
CO2: 31 mEq/L (ref 19–32)
Calcium: 8.5 mg/dL (ref 8.4–10.5)
Chloride: 96 mEq/L (ref 96–112)
Creatinine, Ser: 2.29 mg/dL — ABNORMAL HIGH (ref 0.40–1.50)
GFR: 27.08 mL/min — ABNORMAL LOW (ref >60.00)
Glucose, Bld: 162 mg/dL — ABNORMAL HIGH (ref 70–99)
Potassium: 4.4 mEq/L (ref 3.5–5.1)
Sodium: 136 mEq/L (ref 135–145)

## 2019-03-15 LAB — HEMOGLOBIN A1C: Hgb A1c MFr Bld: 9.7 % — ABNORMAL HIGH (ref 4.6–6.5)

## 2019-03-19 ENCOUNTER — Ambulatory Visit (INDEPENDENT_AMBULATORY_CARE_PROVIDER_SITE_OTHER): Payer: Medicare Other | Admitting: Family Medicine

## 2019-03-19 ENCOUNTER — Encounter: Payer: Self-pay | Admitting: Family Medicine

## 2019-03-19 VITALS — HR 61 | Wt 206.0 lb

## 2019-03-19 DIAGNOSIS — E1142 Type 2 diabetes mellitus with diabetic polyneuropathy: Secondary | ICD-10-CM

## 2019-03-19 DIAGNOSIS — N183 Chronic kidney disease, stage 3 unspecified: Secondary | ICD-10-CM

## 2019-03-19 DIAGNOSIS — R0902 Hypoxemia: Secondary | ICD-10-CM | POA: Diagnosis not present

## 2019-03-19 DIAGNOSIS — Z794 Long term (current) use of insulin: Secondary | ICD-10-CM | POA: Diagnosis not present

## 2019-03-19 NOTE — Progress Notes (Signed)
Virtual visit completed through WebEx or similar program Patient location: home  Provider location: [place] at [facility], office   Limitations and rationale for visit method d/w patient.  Patient agreed to proceed.   CC: DM  HPI: Diabetes:  Using medications without difficulties:yes Hypoglycemic episodes:no Hyperglycemic episodes: rare, cautions d/w pt.  Feet problems: h/o amputation. He has some pain with walking at baseline.  It is similar to prev.  Tylenol helps.  Blood Sugars averaging: 100-180 in the AM, higher if missed the insulin the night prior.   eye exam within last year: A1c up to 9.7.   Taking lantus 15-20 units per day.  Still on glipizide 5mg  a day.   Risk of hypoglycemia d/w pt.     CKD.  Cr elevation d/w pt, up to 2.2 from 1.7-1.8.  On lasix MWF.  He has seen renal clinic in the past but not recently.  D/w pt about changing from tea to water, or at least going 50/50 water vs tea.  We talked about pandemic restrictions and possible renal referral at that point.  He has no swelling. Taking lasix MWF. We may consider tapering lasix.    He isn't SOB now.  Checking  pulse ox at home, occ down to 87 on home check.  Still on inhalers at baseline.  PRN use of O2 at home.   He is on otezla per [facility].  Skin improved with 30mg  BID but had diarrhea.  He cut back to 2 pills 1 day and 1 pill the next, averaging 3 pills in 3 days.  No ADE with that dosing.  Based on Cr, it may be reasonable to back to 1 pill qd.    Past medical history and social history reviewed.  Meds and allergies reviewed.   ROS: Per HPI unless specifically indicated in ROS section   NAD Speech wnl  A/P:  DM-A1c up to 9.7.   Taking lantus 15-20 units per day.  Still on glipizide 5mg  a day.   Risk of hypoglycemia d/w pt.   He will Try to avoid missing insulin and skipping meals.    CKD. Needs lab visit at 9:30 on 03/26/2019.   Cr elevation d/w pt, up to 2.2 from 1.7-1.8.  On lasix MWF.  He has seen  renal clinic in the past but not recently.  D/w pt about changing from tea to water, or at least going 50/50 water vs tea.  We talked about pandemic restrictions and possible renal referral at that point.  He has no swelling. Taking lasix MWF. We may consider tapering lasix.    History of hypoxia.  He isn't SOB now.  Checking  pulse ox at home, occ down to 87 on home check.  Still on inhalers at baseline.  PRN use of O2 at home.

## 2019-03-21 ENCOUNTER — Encounter: Payer: Self-pay | Admitting: Family Medicine

## 2019-03-21 NOTE — Assessment & Plan Note (Signed)
History of hypoxia.  He isn't SOB now.  Checking  pulse ox at home, occ down to 87 on home check.  Still on inhalers at baseline.  PRN use of O2 at home.  Continue as is with as needed use of oxygen and baseline medications.

## 2019-03-21 NOTE — Assessment & Plan Note (Signed)
  Cr elevation d/w pt, up to 2.2 from 1.7-1.8.  On lasix MWF.  He has seen renal clinic in the past but not recently.  D/w pt about changing from tea to water, or at least going 50/50 water vs tea.  Needs lab visit at 9:30 on 03/26/2019.  We talked about pandemic restrictions and possible renal referral at that point.  He has no swelling. Taking lasix MWF. We may consider tapering lasix.

## 2019-03-21 NOTE — Assessment & Plan Note (Signed)
A1c up to 9.7.   Taking lantus 15-20 units per day.  Still on glipizide 5mg  a day.  Risk of hypoglycemia d/w pt.   He will Try to avoid missing insulin and skipping meals.   He can update me about his sugars.

## 2019-03-23 DIAGNOSIS — R062 Wheezing: Secondary | ICD-10-CM | POA: Diagnosis not present

## 2019-03-23 DIAGNOSIS — J9600 Acute respiratory failure, unspecified whether with hypoxia or hypercapnia: Secondary | ICD-10-CM | POA: Diagnosis not present

## 2019-03-23 DIAGNOSIS — I509 Heart failure, unspecified: Secondary | ICD-10-CM | POA: Diagnosis not present

## 2019-03-23 DIAGNOSIS — J449 Chronic obstructive pulmonary disease, unspecified: Secondary | ICD-10-CM | POA: Diagnosis not present

## 2019-03-26 ENCOUNTER — Other Ambulatory Visit (INDEPENDENT_AMBULATORY_CARE_PROVIDER_SITE_OTHER): Payer: Medicare Other

## 2019-03-26 DIAGNOSIS — N183 Chronic kidney disease, stage 3 unspecified: Secondary | ICD-10-CM

## 2019-03-26 LAB — BASIC METABOLIC PANEL
BUN: 41 mg/dL — ABNORMAL HIGH (ref 6–23)
CO2: 32 mEq/L (ref 19–32)
Calcium: 8.5 mg/dL (ref 8.4–10.5)
Chloride: 98 mEq/L (ref 96–112)
Creatinine, Ser: 2.1 mg/dL — ABNORMAL HIGH (ref 0.40–1.50)
GFR: 29.93 mL/min — ABNORMAL LOW (ref >60.00)
Glucose, Bld: 241 mg/dL — ABNORMAL HIGH (ref 70–99)
Potassium: 4.8 mEq/L (ref 3.5–5.1)
Sodium: 138 mEq/L (ref 135–145)

## 2019-03-30 ENCOUNTER — Other Ambulatory Visit: Payer: Self-pay | Admitting: Family Medicine

## 2019-03-30 MED ORDER — APREMILAST 30 MG PO TABS: 1.0000 | ORAL_TABLET | Freq: Every day | ORAL | Status: DC

## 2019-04-01 ENCOUNTER — Telehealth: Payer: Self-pay | Admitting: Family Medicine

## 2019-04-01 NOTE — Telephone Encounter (Signed)
See Labs

## 2019-04-01 NOTE — Telephone Encounter (Signed)
Patient's Wife [name] is calling in regards to patient's lab results. Would like to know if these have come back   BEST PHONE- [phone]

## 2019-04-02 ENCOUNTER — Telehealth: Payer: Self-pay | Admitting: Family Medicine

## 2019-04-02 DIAGNOSIS — N183 Chronic kidney disease, stage 3 unspecified: Secondary | ICD-10-CM

## 2019-04-02 NOTE — Telephone Encounter (Signed)
Best number [phone]  [name] (spouse) called.  She just call Dr Posey Pronto @ [facility]  They need a referral before they can make an appointment

## 2019-04-02 NOTE — Telephone Encounter (Signed)
Ordered. Thanks

## 2019-04-02 NOTE — Telephone Encounter (Signed)
Left message to call back. Referral faxed over to Dr. Posey Pronto. Need to make sure that patient will call and set up his own appointment or do we need to help with that.

## 2019-04-06 NOTE — Telephone Encounter (Signed)
Wife says that she will call to schedule the appointment.

## 2019-04-10 DIAGNOSIS — J9600 Acute respiratory failure, unspecified whether with hypoxia or hypercapnia: Secondary | ICD-10-CM | POA: Diagnosis not present

## 2019-04-10 DIAGNOSIS — J449 Chronic obstructive pulmonary disease, unspecified: Secondary | ICD-10-CM | POA: Diagnosis not present

## 2019-04-10 DIAGNOSIS — I509 Heart failure, unspecified: Secondary | ICD-10-CM | POA: Diagnosis not present

## 2019-04-10 DIAGNOSIS — R062 Wheezing: Secondary | ICD-10-CM | POA: Diagnosis not present

## 2019-04-14 ENCOUNTER — Telehealth: Payer: Self-pay

## 2019-04-14 NOTE — Telephone Encounter (Signed)
Virtual Visit Pre-Appointment Phone Call  "[name] Small, I am calling you today to discuss your upcoming appointment. We are currently trying to limit exposure to the virus that causes COVID-19 by seeing patients at home rather than in the office."  1. "What is the BEST phone number to call the day of the visit?" - include this in appointment notes  2. Do you have or have access to (through a family member/friend) a smartphone with video capability that we can use for your visit?" a. If yes - list this number in appt notes as cell (if different from BEST phone #) and list the appointment type as a VIDEO visit in appointment notes b. If no - list the appointment type as a PHONE visit in appointment notes  3. Confirm consent - "In the setting of the current Covid19 crisis, you are scheduled for a phone visit with your provider on 04/20/2019 at 9:30AM.  Just as we do with many in-office visits, in order for you to participate in this visit, we must obtain consent.  If you'd like, I can send this to your mychart (if signed up) or email for you to review.  Otherwise, I can obtain your verbal consent now.  All virtual visits are billed to your insurance company just like a normal visit would be.  By agreeing to a virtual visit, we'd like you to understand that the technology does not allow for your provider to perform an examination, and thus may limit your provider's ability to fully assess your condition. If your provider identifies any concerns that need to be evaluated in person, we will make arrangements to do so.  Finally, though the technology is pretty good, we cannot assure that it will always work on either your or our end, and in the setting of a video visit, we may have to convert it to a phone-only visit.  In either situation, we cannot ensure that we have a secure connection.  Are you willing to proceed?" STAFF: Did the patient verbally acknowledge consent to telehealth visit? Document YES/NO  here: YES PER WIFE [name] Small  4. Advise patient to be prepared - "Two hours prior to your appointment, go ahead and check your blood pressure, pulse, oxygen saturation, and your weight (if you have the equipment to check those) and write them all down. When your visit starts, your provider will ask you for this information. If you have an Apple Watch or Kardia device, please plan to have heart rate information ready on the day of your appointment. Please have a pen and paper handy nearby the day of the visit as well."  5. Give patient instructions for MyChart download to smartphone OR [email or web address] as below if video visit (depending on what platform provider is using)  6. Inform patient they will receive a phone call 15 minutes prior to their appointment time (may be from unknown caller ID) so they should be prepared to answer    [name] Small has been deemed a candidate for a follow-up tele-health visit to limit community exposure during the Covid-19 pandemic. I spoke with the patient via phone to ensure availability of phone/video source, confirm preferred email & phone number, and discuss instructions and expectations.  I reminded [name] Small to be prepared with any vital sign and/or heart rhythm information that could potentially be obtained via home monitoring, at the time of his visit. I reminded [name] Small to expect a phone call prior  to his visit.  [name] Small 04/14/2019 4:04 PM    FULL LENGTH CONSENT FOR TELE-HEALTH VISIT   I hereby voluntarily request, consent and authorize [facility] and its employed or contracted physicians, physician assistants, nurse practitioners or other licensed health care professionals (the Practitioner), to provide me with telemedicine health care services (the Services") as deemed necessary by the treating Practitioner. I acknowledge and consent to receive the Services by the Practitioner via telemedicine. I  understand that the telemedicine visit will involve communicating with the Practitioner through live audiovisual communication technology and the disclosure of certain medical information by electronic transmission. I acknowledge that I have been given the opportunity to request an in-person assessment or other available alternative prior to the telemedicine visit and am voluntarily participating in the telemedicine visit.  I understand that I have the right to withhold or withdraw my consent to the use of telemedicine in the course of my care at any time, without affecting my right to future care or treatment, and that the Practitioner or I may terminate the telemedicine visit at any time. I understand that I have the right to inspect all information obtained and/or recorded in the course of the telemedicine visit and may receive copies of available information for a reasonable fee.  I understand that some of the potential risks of receiving the Services via telemedicine include:   Delay or interruption in medical evaluation due to technological equipment failure or disruption;  Information transmitted may not be sufficient (e.g. poor resolution of images) to allow for appropriate medical decision making by the Practitioner; and/or   In rare instances, security protocols could fail, causing a breach of personal health information.  Furthermore, I acknowledge that it is my responsibility to provide information about my medical history, conditions and care that is complete and accurate to the best of my ability. I acknowledge that Practitioner's advice, recommendations, and/or decision may be based on factors not within their control, such as incomplete or inaccurate data provided by me or distortions of diagnostic images or specimens that may result from electronic transmissions. I understand that the practice of medicine is not an exact science and that Practitioner makes no warranties or guarantees  regarding treatment outcomes. I acknowledge that I will receive a copy of this consent concurrently upon execution via email to the email address I last provided but may also request a printed copy by calling the office of [facility].    I understand that my insurance will be billed for this visit.   I have read or had this consent read to me.  I understand the contents of this consent, which adequately explains the benefits and risks of the Services being provided via telemedicine.   I have been provided ample opportunity to ask questions regarding this consent and the Services and have had my questions answered to my satisfaction.  I give my informed consent for the services to be provided through the use of telemedicine in my medical care  By participating in this telemedicine visit I agree to the above.

## 2019-04-20 ENCOUNTER — Telehealth (INDEPENDENT_AMBULATORY_CARE_PROVIDER_SITE_OTHER): Payer: Medicare Other | Admitting: Nurse Practitioner

## 2019-04-20 ENCOUNTER — Other Ambulatory Visit: Payer: Self-pay

## 2019-04-20 ENCOUNTER — Encounter: Payer: Self-pay | Admitting: Nurse Practitioner

## 2019-04-20 VITALS — BP 122/57 | Ht 74.0 in | Wt 204.0 lb

## 2019-04-20 DIAGNOSIS — I10 Essential (primary) hypertension: Secondary | ICD-10-CM

## 2019-04-20 DIAGNOSIS — I739 Peripheral vascular disease, unspecified: Secondary | ICD-10-CM | POA: Diagnosis not present

## 2019-04-20 DIAGNOSIS — N184 Chronic kidney disease, stage 4 (severe): Secondary | ICD-10-CM

## 2019-04-20 DIAGNOSIS — E782 Mixed hyperlipidemia: Secondary | ICD-10-CM

## 2019-04-20 NOTE — Progress Notes (Signed)
Virtual Visit via Telephone Note   This visit type was conducted due to national recommendations for restrictions regarding the COVID-19 Pandemic (e.g. social distancing) in an effort to limit this patient's exposure and mitigate transmission in our community.  Due to his co-morbid illnesses, this patient is at least at moderate risk for complications without adequate follow up.  This format is felt to be most appropriate for this patient at this time.  The patient did not have access to video technology/had technical difficulties with video requiring transitioning to audio format only (telephone).  All issues noted in this document were discussed and addressed.  No physical exam could be performed with this format.  Please refer to the patient's chart for his  consent to telehealth for [facility]. Evaluation Performed:  Follow-up visit  This visit type was conducted due to national recommendations for restrictions regarding the COVID-19 Pandemic (e.g. social distancing).  This format is felt to be most appropriate for this patient at this time.  All issues noted in this document were discussed and addressed.  No physical exam was performed (except for noted visual exam findings with Video Visits).  Please refer to the patient's chart (MyChart message for video visits and phone note for telephone visits) for the patient's consent to telehealth for [facility]. _____________   Date:  04/20/2019   Patient ID:  [name] Small, DOB 02-12-1928, MRN [id number] Patient Location:  Home Provider location:   Office  Primary Care Provider:  Tonia Ghent, MD Primary Cardiologist:  [name], MD  Chief Complaint    85 year old male with a history of peripheral arterial disease, nonobstructive CAD, stage IV chronic kidney disease, hypertension, diabetes, remote tobacco abuse, and bladder cancer, who presents for peripheral arterial disease follow-up.  Past Medical History    Past  Medical History:  Diagnosis Date   (HFpEF) heart failure with preserved ejection fraction (HCC)    Arthritis    Back pain    s/p lumbar injection 2014   BENIGN PROSTATIC HYPERTROPHY 05/21/2007   Bladder cancer ([place]) 10/2011   CAD (coronary artery disease)    nonobstructive by cath 6/12:  mid LAD 30%, proximal obtuse marginal-2 30%, proximal RCA 20%, mid RCA 30-40%.  He had normal cardiac output and mildly elevated filling pressures but no significant pulmonary hypertension;   Echocardiogram in May 2012 demonstrated EF 50-55% and left atrial enlargement    Cellulitis of left foot    Hospitalized in 2006   Chronic kidney disease (CKD), stage III (moderate) (HCC) 12/04/2007   FOLLOWED BY DR PATEL   Chronic pain of lower extremity    Complication of anesthesia    1 time bladder cancer surgery- 2012 , oxygen satruratioon dropped had to stay overnight, no problems since then.   COPD (chronic obstructive pulmonary disease) ([place])    DIABETES MELLITUS, TYPE II 05/21/2007   DIVERTICULOSIS, COLON 05/21/2007   pt denies   Dyspnea    with exertion, patient mointors saturation   Fatigue AGE-RELATED   Gross hematuria    pt denies   HYPERTENSION 05/21/2007   Impaired hearing BILATERAL HEARING AIDS   On home oxygen therapy    "2L at nighttime" (02/28/2016)   PAD (peripheral artery disease) ([place])    a. 02/2016 L foot nonhealing ulcer-->Periph angio: L pop 100 w/ reconstitution via extensive vollats to the prox-mid peroneal (only patent vessel BK)-->PTA of L Pop and peroneal; b. Ongoing LE ischemia 5 & 06/2016 req amputation of toes on L  foot.   Pneumonia    Hx  of    Psoriasis ELBOWS   Psoriasis    PSORIASIS, SCALP 10/25/2008   Past Surgical History:  Procedure Laterality Date   AMPUTATION Left 03/27/2016   Procedure: partial first AMPUTATION RAY; LEFT;  Surgeon: Trula Slade, DPM;  Location: [place];  Service: Podiatry;  Laterality: Left;   AMPUTATION Left 06/12/2016    Procedure: TRANSMETATARSAL AMPUTATION LEFT FOOT;  Surgeon: Newt Minion, MD;  Location: [place];  Service: Orthopedics;  Laterality: Left;   APPENDECTOMY  1941   CARDIAC CATHETERIZATION  04-24-11/  DR Rogue Jury ARIDA   MILD NONOBSTRUCTIVE CAD, [place]MA CARDIAC OUTPUT   CARDIOVASCULAR STRESS TEST  2007   CATARACT EXTRACTION W/ INTRAOCULAR LENS  IMPLANT, BILATERAL Bilateral ~ 2010   CYSTOSCOPY  12/25/2011   Procedure: CYSTOSCOPY;  Surgeon: Molli Hazard, MD;  Location: [facility];  Service: Urology;  Laterality: N/A;  needs intubation and to be paralyzed    CYSTOSCOPY W/ RETROGRADES  10/30/2011   Procedure: CYSTOSCOPY WITH RETROGRADE PYELOGRAM;  Surgeon: Molli Hazard, MD;  Location: [facility];  Service: Urology;  Laterality: Bilateral;  CYSTOSCOPY POSS TURBT BILATERAL RETROGRADE PYLEOGRAM    CYSTOSCOPY W/ RETROGRADES  11/30/2012   Procedure: CYSTOSCOPY WITH RETROGRADE PYELOGRAM;  Surgeon: Molli Hazard, MD;  Location: [facility];  Service: Urology;  Laterality: Bilateral;  Flexible cystoscopy.   CYSTOSCOPY WITH BIOPSY  11/30/2012   Procedure: CYSTOSCOPY WITH BIOPSY;  Surgeon: Molli Hazard, MD;  [facility];  Service: Urology;  Laterality: N/A;   INCISION AND DRAINAGE FOOT Left ~ 2005   LEFT FOOT DUE TO INFECTION FROM  NAIL PUNCTURE INJURY   LUMBAR LAMINECTOMY/DECOMPRESSION MICRODISCECTOMY N/A 10/18/2016   Procedure: LEFT AND CENTRAL L4-5 LUMBAR LAMINECTOMY WITH RESECTION OF SYNOVIAL CYST;  Surgeon: Jessy Oto, MD;  Location: [place];  Service: Orthopedics;  Laterality: N/A;   PENILE PROSTHESIS IMPLANT  1990s   PERIPHERAL VASCULAR CATHETERIZATION N/A 02/14/2016   Procedure: Abdominal Aortogram w/Lower Extremity;  Surgeon: Wellington Hampshire, MD;  Location: [place] [facility];  Service: Cardiovascular;  Laterality: N/A;   PERIPHERAL VASCULAR CATHETERIZATION Left 02/28/2016   Procedure:  Peripheral Vascular Balloon Angioplasty;  Surgeon: Wellington Hampshire, MD;  Location: [place] [facility];  Service: Cardiovascular;  Laterality: Left;  left peroneal and popliteal artery   TONSILLECTOMY AND ADENOIDECTOMY  1962   TOTAL HIP ARTHROPLASTY Left 12/27/2016   Procedure: LEFT TOTAL HIP ARTHROPLASTY ANTERIOR APPROACH;  Surgeon: Mcarthur Rossetti, MD;  Location: [facility];  Service: Orthopedics;  Laterality: Left;   TRANSURETHRAL RESECTION OF BLADDER TUMOR  10/30/2011   Procedure: TRANSURETHRAL RESECTION OF BLADDER TUMOR (TURBT);  Surgeon: Molli Hazard, MD;  Location: [facility];  Service: Urology;  Laterality: N/A;   TRANSURETHRAL RESECTION OF BLADDER TUMOR  12/25/2011   Procedure: TRANSURETHRAL RESECTION OF BLADDER TUMOR (TURBT);  Surgeon: Molli Hazard, MD;  Location: [facility];  Service: Urology;  Laterality: N/A;  need long gyrus instruments    VASECTOMY  1990s    Allergies  Allergies  Allergen Reactions   Actos [Pioglitazone Hydrochloride] Other (See Comments)    Held 2012 bladder cancer   Aspirin Other (See Comments)    Held 2012 due to hematuria and bruising.    Ace Inhibitors Cough   Bactrim Rash and Other (See Comments)    Rash, presumed allergy   Gabapentin Nausea And Vomiting  Upset stomach and diarrhea - tolerates low doses   Lyrica [Pregabalin] Swelling    swelling   Pravastatin Swelling    Swelling of feet   Sulfa Drugs Cross Reactors Rash    History of Present Illness    [name] Small is a 85 y.o. male who presents via [employer], civil (consulting) for a telehealth visit today.  85 year old male with the above complex past medical history including peripheral arterial disease, nonobstructive CAD, stage IV chronic kidney disease, hypertension, diabetes, remote tobacco abuse, and bladder cancer.  In early 2017 he was diagnosed with critical limb ischemia in the setting of a nonhealing ulcer on his left  foot.  Peripheral angiography in April 2017 showed an occluded distal left popliteal artery with reconstitution via extensive collaterals to the proximal to mid peroneal artery which was the only patent vessel below the knee.  Successful angioplasty of the popliteal and peroneal arteries were was performed however, due to ongoing infections, he ultimately underwent amputations of all of the toes on his left foot.  He was last seen by Dr. Fletcher Anon in November 2019, at which time he was stable.  His ABIs in August of last year were also stable at 0.93 on the right and 0.91 on the left.  Since his last visit, he has been doing reasonably well.  He has chronic bilateral lower extremity pain in the setting of diabetic neuropathy but has not been experiencing any claudication.  He does walk some but activity is limited in the setting of prior amputation, back pain, and neuropathy.  He does not experience chest pain, dyspnea, palpitations, PND, orthopnea, dizziness, syncope, edema, or early satiety.  He and his wife have been careful to avoid crowds and wearing a mask when in public.  He mostly stays in the car if they have to go to the grocery store.  The patient does not have symptoms concerning for COVID-19 infection (fever, chills, cough, or new shortness of breath).   Home Medications    Prior to Admission medications   Medication Sig Start Date End Date Taking? Authorizing Provider  acetaminophen (TYLENOL) 500 MG tablet Take 1,000 mg by mouth every 8 (eight) hours as needed for mild pain, moderate pain or headache.   Yes [provider]  amLODipine (NORVASC) 2.5 MG tablet TAKE 1 TABLET BY MOUTH  DAILY 02/08/19  Yes Wellington Hampshire, MD  Apremilast 30 MG TABS Take 1 tablet by mouth daily. 03/30/19  Yes Tonia Ghent, MD  atorvastatin (LIPITOR) 10 MG tablet Take 1 tablet (10 mg total) by mouth daily. 09/25/18  Yes Tonia Ghent, MD  budesonide-formoterol [facility]) 160-4.5 MCG/ACT inhaler  Inhale 2 puffs into the lungs 2 (two) times daily as needed (shortness of breath).  01/21/14  Yes Tonia Ghent, MD  clobetasol (TEMOVATE) 0.05 % external solution Apply 1 application topically daily.   Yes [provider]  clopidogrel (PLAVIX) 75 MG tablet Take 1 tablet (75 mg total) by mouth daily. 02/24/19  Yes Tonia Ghent, MD  furosemide (LASIX) 40 MG tablet TAKE 1 TABLET BY MOUTH  EVERY MONDAY, WEDNESDAY,  AND FRIDAY 02/22/19  Yes Tonia Ghent, MD  glipiZIDE (GLUCOTROL XL) 5 MG 24 hr tablet Take 1 tablet (5 mg total) by mouth daily with breakfast. 02/24/19  Yes Tonia Ghent, MD  insulin glargine (LANTUS) 100 UNIT/ML injection Inject 0.15 mLs (15 Units total) into the skin daily. 10/17/16  Yes Tonia Ghent, MD  lidocaine (XYLOCAINE) 5 %  ointment Apply 1 application topically daily as needed.   Yes [provider]  tiotropium (SPIRIVA) 18 MCG inhalation capsule Place 1 capsule (18 mcg total) into inhaler and inhale daily. 09/25/18  Yes Tonia Ghent, MD    Review of Systems    He denies chest pain, palpitations, dyspnea, PND, orthopnea, dizziness, syncope, edema, early satiety, or claudication.  He has chronic bilateral lower extremity discomfort related to peripheral neuropathy..  All other systems reviewed and are otherwise negative except as noted above.  Physical Exam    Vital Signs:  BP (!) 122/57 (BP Location: Left Arm, Patient Position: Sitting, Cuff Size: Normal)    Ht 6\' 2"  (1.88 m)    Wt 204 lb (92.5 kg)    BMI 26.19 kg/m    Phone visit-Pleasant, no acute distress, awake alert and oriented x3.  Speech is unlabored.  Accessory Clinical Findings    Lab Results  Component Value Date   CREATININE 2.10 (H) 03/26/2019   BUN 41 (H) 03/26/2019   NA 138 03/26/2019   K 4.8 03/26/2019   CL 98 03/26/2019   CO2 32 03/26/2019    Lab Results  Component Value Date   WBC 8.1 09/25/2018   HGB 16.2 09/25/2018   HCT 49.3 09/25/2018   MCV 99.3  09/25/2018   PLT 239.0 09/25/2018   Lab Results  Component Value Date   CHOL 118 09/22/2018   HDL 38.00 (L) 09/22/2018   LDLCALC 61 09/22/2018   LDLDIRECT 147.6 04/27/2009   TRIG 96.0 09/22/2018   CHOLHDL 3 09/22/2018     Assessment & Plan    1.  Peripheral arterial disease: Status post successful angioplasty left popliteal and peroneal with subsequent amputation of the toes on his left foot.  He does not walk much but has not been experiencing calf claudication.  Vascular studies in August showed stable ABIs and he will be due for repeat in late August of this year, which we will arrange.  He remains on Plavix and statin therapy.  No aspirin in the setting of prior hematuria.  2.  Hyperlipidemia: LDL of 61 in November 2019.  Continue statin therapy.  3.  Stage IV chronic kidney disease: Creatinine was stable at 2.10 on evaluation on May 15.  4.  Essential hypertension: Stable.  5.  Nonobstructive coronary artery disease: No chest pain or dyspnea.  Remains on Plavix and statin.  6.  Disposition: Follow-up ABIs/duplex in August.  Follow-up with Dr. Fletcher Anon in 6 months or sooner if necessary.  COVID-19 Education: The signs and symptoms of COVID-19 were discussed with the patient and how to seek care for testing (follow up with PCP or arrange E-visit).  The importance of social distancing was discussed today.  Patient Risk:   After full review of this patient's history and clinical status, I feel that he is at least moderate risk for cardiac complications at this time, thus necessitating a telehealth visit sooner than our first available in office visit.  Time:   Today, I have spent 13 minutes with the patient with telehealth technology discussing medical history, symptoms, and management plan.     Murray Hodgkins, NP 04/20/2019, 1:34 PM

## 2019-04-20 NOTE — Patient Instructions (Signed)
Medication Instructions:  Your physician recommends that you continue on your current medications as directed. Please refer to the Current Medication list given to you today.  If you need a refill on your cardiac medications before your next appointment, please call your pharmacy.   Lab work: none If you have labs (blood work) drawn today and your tests are completely normal, you will receive your results only by: Marland Kitchen MyChart Message (if you have MyChart) OR . A paper copy in the mail If you have any lab test that is abnormal or we need to change your treatment, we will call you to review the results.  Testing/Procedures: Your physician has requested that you have an ankle brachial index (ABI) in late August/Early September. During this test an ultrasound and blood pressure cuff are used to evaluate the arteries that supply the arms and legs with blood. Allow thirty minutes for this exam. There are no restrictions or special instructions.  Your physician has requested that you have a lower extremity arterial dopplerin late August/Early September - During this test, ultrasound is used to evaluate arterial blood flow in the legs. Allow approximately one hour for this exam.   Follow-Up: At [facility], you and your health needs are our priority.  As part of our continuing mission to provide you with exceptional heart care, we have created designated Provider Care Teams.  These Care Teams include your primary Cardiologist (physician) and Advanced Practice Providers (APPs -  Physician Assistants and Nurse Practitioners) who all work together to provide you with the care you need, when you need it. You will need a follow up appointment in 6 months.  Please call our office 2 months in advance to schedule this appointment.  You may see [name], MD or one of the following Advanced Practice Providers on your designated Care Team:   Murray Hodgkins, NP Christell Faith, PA-C . Marrianne Mood, PA-C     Ankle-Brachial Index Test Why am I having this test? The ankle-brachial index (ABI) test is used to diagnose peripheral vascular disease (PVD). PVD is also known as peripheral arterial disease (PAD). PVD is the blocking or hardening of the arteries anywhere within the circulatory system beyond the heart. PVD is caused by:  Cholesterol deposits in your blood vessels (atherosclerosis). This is the most common cause of this condition.  Irritation and swelling (inflammation) in the blood vessels.  Blood clots in the vessels. Cholesterol deposits cause arteries to narrow. Normal delivery of oxygen to your tissues is affected, causing muscle pain and fatigue. This is called claudication. PVD means that there may also be a buildup of cholesterol:  In your heart. This increases the risk of heart attacks.  In your brain. This increases the risk of strokes. What is being tested? The ankle-brachial index test measures the blood flow in your arms and legs. The blood flow will show if blood vessels in your legs have been narrowed by cholesterol deposits. How do I prepare for this test?  Wear loose clothing.  Do not use any tobacco products, including cigarettes, chewing tobacco, or e-cigarettes, for at least 30 minutes before the test. What happens during the test?  1. You will lie down in a resting position. 2. Your health care provider will use a blood pressure machine and a small ultrasound device (Doppler) to measure the systolic pressures on your upper arms and ankles. Systolic pressure is the pressure inside your arteries when your heart pumps. 3. Systolic pressure measurements will be taken  several times, and at several points, on both the ankle and the arm. 4. Your health care provider will divide the highest systolic pressure of the ankle by the highest systolic pressure of the arm. The result is the ankle-brachial pressure ratio, or ABI. Sometimes this test will be repeated after you  have exercised on a treadmill for 5 minutes. You may have leg pain during the exercise portion of the test if you suffer from PVD. If the index number drops after exercise, this may show that PVD is present. How are the results reported? Your test results will be reported as a value that shows the ratio of your ankle pressure to your arm pressure (ABI ratio). Your health care provider will compare your results to normal ranges that were established after testing a large group of people (reference ranges). Reference ranges may vary among labs and hospitals. For this test, a common reference range is:  ABI ratio of 0.9 to 1.3. What do the results mean? An ABI ratio that is below the reference range is considered abnormal and may indicate PVD in the legs. Talk with your health care provider about what your results mean. Questions to ask your health care provider Ask your health care provider, or the department that is doing the test:  When will my results be ready?  How will I get my results?  What are my treatment options?  What other tests do I need?  What are my next steps? Summary  The ankle-brachial index (ABI) test is used to diagnose peripheral vascular disease (PVD). PVD is also known as peripheral arterial disease (PAD).  The ankle-brachial index test measures the blood flow in your arms and legs.  The highest systolic pressure of the ankle is divided by the highest systolic pressure of the arm. The result is the ABI ratio.  An ABI ratio that is below 0.9 is considered abnormal and may indicate PVD in the legs. This information is not intended to replace advice given to you by your health care provider. Make sure you discuss any questions you have with your health care provider. Document Released: 11/01/2004 Document Revised: 07/22/2017 Document Reviewed: 07/22/2017 Elsevier Interactive Patient Education  [employer].

## 2019-04-23 DIAGNOSIS — J9600 Acute respiratory failure, unspecified whether with hypoxia or hypercapnia: Secondary | ICD-10-CM | POA: Diagnosis not present

## 2019-04-23 DIAGNOSIS — R062 Wheezing: Secondary | ICD-10-CM | POA: Diagnosis not present

## 2019-04-23 DIAGNOSIS — J449 Chronic obstructive pulmonary disease, unspecified: Secondary | ICD-10-CM | POA: Diagnosis not present

## 2019-04-23 DIAGNOSIS — I509 Heart failure, unspecified: Secondary | ICD-10-CM | POA: Diagnosis not present

## 2019-04-29 DIAGNOSIS — I129 Hypertensive chronic kidney disease with stage 1 through stage 4 chronic kidney disease, or unspecified chronic kidney disease: Secondary | ICD-10-CM | POA: Diagnosis not present

## 2019-04-29 DIAGNOSIS — R5383 Other fatigue: Secondary | ICD-10-CM | POA: Diagnosis not present

## 2019-04-29 DIAGNOSIS — D631 Anemia in chronic kidney disease: Secondary | ICD-10-CM | POA: Diagnosis not present

## 2019-04-29 DIAGNOSIS — N184 Chronic kidney disease, stage 4 (severe): Secondary | ICD-10-CM | POA: Diagnosis not present

## 2019-04-29 DIAGNOSIS — N2581 Secondary hyperparathyroidism of renal origin: Secondary | ICD-10-CM | POA: Diagnosis not present

## 2019-04-29 DIAGNOSIS — N189 Chronic kidney disease, unspecified: Secondary | ICD-10-CM | POA: Diagnosis not present

## 2019-04-29 LAB — BASIC METABOLIC PANEL
Creatinine: 2 — AB (ref 0.6–1.3)
Glucose: 188
Potassium: 4.9 (ref 3.4–5.3)

## 2019-04-29 LAB — CBC AND DIFFERENTIAL: Hemoglobin: 15 (ref 13.5–17.5)

## 2019-04-29 LAB — VITAMIN D 25 HYDROXY (VIT D DEFICIENCY, FRACTURES): Vit D, 25-Hydroxy: 40.1

## 2019-05-04 DIAGNOSIS — E119 Type 2 diabetes mellitus without complications: Secondary | ICD-10-CM | POA: Diagnosis not present

## 2019-05-04 DIAGNOSIS — Z794 Long term (current) use of insulin: Secondary | ICD-10-CM | POA: Diagnosis not present

## 2019-05-06 ENCOUNTER — Other Ambulatory Visit: Payer: Self-pay | Admitting: Nephrology

## 2019-05-06 DIAGNOSIS — N184 Chronic kidney disease, stage 4 (severe): Secondary | ICD-10-CM

## 2019-05-11 ENCOUNTER — Telehealth: Payer: Self-pay | Admitting: Family Medicine

## 2019-05-11 ENCOUNTER — Encounter: Payer: Self-pay | Admitting: Family Medicine

## 2019-05-11 DIAGNOSIS — J9600 Acute respiratory failure, unspecified whether with hypoxia or hypercapnia: Secondary | ICD-10-CM | POA: Diagnosis not present

## 2019-05-11 DIAGNOSIS — R062 Wheezing: Secondary | ICD-10-CM | POA: Diagnosis not present

## 2019-05-11 DIAGNOSIS — I509 Heart failure, unspecified: Secondary | ICD-10-CM | POA: Diagnosis not present

## 2019-05-11 DIAGNOSIS — J449 Chronic obstructive pulmonary disease, unspecified: Secondary | ICD-10-CM | POA: Diagnosis not present

## 2019-05-11 MED ORDER — NYSTATIN 100000 UNIT/GM EX POWD: CUTANEOUS | 0 refills | Status: DC

## 2019-05-11 NOTE — Telephone Encounter (Signed)
I spoke with [name] Small (DPR signed) pt has redness in groin, noticed on 05/08/19. No rash,no itching,no pain. Pt sits a lot and there is a fold where the groin is.no skin breakdown;  [place] drug; pt wonders if med or what to do for that area. Also sometimes urine has odor and pt is scheduled 07/07/ 20 for [place] to ck out kidneys. Today FBS was 120; pt felt fine. No diabetic symptoms. Pt takes lantus 15 Units around 6 PM daily. And does not ck BS before taking lantus. [name] said to my chart her back or send to [facility] cell after Dr Damita Dunnings reviews

## 2019-05-11 NOTE — Telephone Encounter (Signed)
Thanks see [employer].

## 2019-05-11 NOTE — Telephone Encounter (Signed)
See mychart message about skin changes. Please triage patient.  Thanks.

## 2019-05-18 ENCOUNTER — Ambulatory Visit
Admission: RE | Admit: 2019-05-18 | Discharge: 2019-05-18 | Disposition: A | Discharge status: Home / Self Care | Payer: Medicare Other | Source: Ambulatory Visit | Attending: Nephrology | Admitting: Nephrology

## 2019-05-18 DIAGNOSIS — N189 Chronic kidney disease, unspecified: Secondary | ICD-10-CM | POA: Diagnosis not present

## 2019-05-18 DIAGNOSIS — N184 Chronic kidney disease, stage 4 (severe): Secondary | ICD-10-CM

## 2019-05-23 DIAGNOSIS — J449 Chronic obstructive pulmonary disease, unspecified: Secondary | ICD-10-CM | POA: Diagnosis not present

## 2019-05-23 DIAGNOSIS — I509 Heart failure, unspecified: Secondary | ICD-10-CM | POA: Diagnosis not present

## 2019-05-23 DIAGNOSIS — J9600 Acute respiratory failure, unspecified whether with hypoxia or hypercapnia: Secondary | ICD-10-CM | POA: Diagnosis not present

## 2019-05-23 DIAGNOSIS — R062 Wheezing: Secondary | ICD-10-CM | POA: Diagnosis not present

## 2019-06-09 ENCOUNTER — Other Ambulatory Visit: Payer: Self-pay | Admitting: Family Medicine

## 2019-06-10 DIAGNOSIS — J449 Chronic obstructive pulmonary disease, unspecified: Secondary | ICD-10-CM | POA: Diagnosis not present

## 2019-06-10 DIAGNOSIS — I509 Heart failure, unspecified: Secondary | ICD-10-CM | POA: Diagnosis not present

## 2019-06-10 DIAGNOSIS — R062 Wheezing: Secondary | ICD-10-CM | POA: Diagnosis not present

## 2019-06-10 DIAGNOSIS — J9600 Acute respiratory failure, unspecified whether with hypoxia or hypercapnia: Secondary | ICD-10-CM | POA: Diagnosis not present

## 2019-06-10 NOTE — Telephone Encounter (Signed)
Last refilled on 02/22/2019 for #[id number] with 0 refill. LOV 03/19/2019, future appointment on 06/24/2019.

## 2019-06-11 NOTE — Telephone Encounter (Signed)
Sent. Thanks.   

## 2019-06-17 ENCOUNTER — Other Ambulatory Visit: Payer: Self-pay | Admitting: Family Medicine

## 2019-06-21 ENCOUNTER — Other Ambulatory Visit: Payer: Self-pay | Admitting: Family Medicine

## 2019-06-21 ENCOUNTER — Other Ambulatory Visit: Payer: Medicare Other

## 2019-06-21 DIAGNOSIS — N184 Chronic kidney disease, stage 4 (severe): Secondary | ICD-10-CM | POA: Diagnosis not present

## 2019-06-21 DIAGNOSIS — E1142 Type 2 diabetes mellitus with diabetic polyneuropathy: Secondary | ICD-10-CM

## 2019-06-21 DIAGNOSIS — Z794 Long term (current) use of insulin: Secondary | ICD-10-CM

## 2019-06-22 ENCOUNTER — Other Ambulatory Visit (INDEPENDENT_AMBULATORY_CARE_PROVIDER_SITE_OTHER): Payer: Medicare Other

## 2019-06-22 ENCOUNTER — Other Ambulatory Visit: Payer: Self-pay

## 2019-06-22 DIAGNOSIS — E1142 Type 2 diabetes mellitus with diabetic polyneuropathy: Secondary | ICD-10-CM

## 2019-06-22 DIAGNOSIS — Z794 Long term (current) use of insulin: Secondary | ICD-10-CM

## 2019-06-22 LAB — BASIC METABOLIC PANEL
BUN: 35 mg/dL — ABNORMAL HIGH (ref 6–23)
CO2: 34 mEq/L — ABNORMAL HIGH (ref 19–32)
Calcium: 8.6 mg/dL (ref 8.4–10.5)
Chloride: 99 mEq/L (ref 96–112)
Creatinine, Ser: 2.03 mg/dL — ABNORMAL HIGH (ref 0.40–1.50)
GFR: 31.11 mL/min — ABNORMAL LOW (ref >60.00)
Glucose, Bld: 302 mg/dL — ABNORMAL HIGH (ref 70–99)
Potassium: 4.7 mEq/L (ref 3.5–5.1)
Sodium: 138 mEq/L (ref 135–145)

## 2019-06-22 LAB — HEMOGLOBIN A1C: Hgb A1c MFr Bld: 9 % — ABNORMAL HIGH (ref 4.6–6.5)

## 2019-06-23 DIAGNOSIS — J9600 Acute respiratory failure, unspecified whether with hypoxia or hypercapnia: Secondary | ICD-10-CM | POA: Diagnosis not present

## 2019-06-23 DIAGNOSIS — I509 Heart failure, unspecified: Secondary | ICD-10-CM | POA: Diagnosis not present

## 2019-06-23 DIAGNOSIS — J449 Chronic obstructive pulmonary disease, unspecified: Secondary | ICD-10-CM | POA: Diagnosis not present

## 2019-06-23 DIAGNOSIS — R062 Wheezing: Secondary | ICD-10-CM | POA: Diagnosis not present

## 2019-06-24 ENCOUNTER — Other Ambulatory Visit: Payer: Self-pay

## 2019-06-24 ENCOUNTER — Ambulatory Visit: Payer: Medicare Other | Admitting: Family Medicine

## 2019-06-24 ENCOUNTER — Ambulatory Visit (INDEPENDENT_AMBULATORY_CARE_PROVIDER_SITE_OTHER): Payer: Medicare Other | Admitting: Family Medicine

## 2019-06-24 VITALS — BP 140/60 | Temp 97.7°F | Wt 201.0 lb

## 2019-06-24 DIAGNOSIS — Z794 Long term (current) use of insulin: Secondary | ICD-10-CM | POA: Diagnosis not present

## 2019-06-24 DIAGNOSIS — E1142 Type 2 diabetes mellitus with diabetic polyneuropathy: Secondary | ICD-10-CM | POA: Diagnosis not present

## 2019-06-24 DIAGNOSIS — R609 Edema, unspecified: Secondary | ICD-10-CM

## 2019-06-24 DIAGNOSIS — N183 Chronic kidney disease, stage 3 unspecified: Secondary | ICD-10-CM

## 2019-06-24 NOTE — Progress Notes (Signed)
Interactive audio and video telecommunications were attempted between this provider and patient, however failed, due to patient having technical difficulties OR patient did not have access to video capability.  We continued and completed visit with audio only.   Virtual Visit via Telephone Note  I connected with patient on 06/24/19  at 9:51 AM  by telephone and verified that I am speaking with the correct person using two identifiers.  Location of patient: home.   Location of MD: [facility] Name of referring provider (if blank then none associated): Names per persons and role in encounter:  MD: Earlyne Iba, Patient: name listed above.    I discussed the limitations, risks, security and privacy concerns of performing an evaluation and management service by telephone and the availability of in person appointments. I also discussed with the patient that there may be a patient responsible charge related to this service. The patient expressed understanding and agreed to proceed.  CC: follow up.    History of Present Illness:   Diabetes:  Using medications without difficulties: yes Hypoglycemic episodes: rarely lows but not recently, cautions d/w pt.  Hyperglycemic episodes: no Feet problems: he has some swelling, not daily but occ. He had been sitting on the deck a lot without propping up his feet.  Reports no skin breakdown.   Blood Sugars averaging: usually ~ 100-130 in the AM, unless he misses a dose of insulin  A1c improved at 9.  Goal ~8 given his age. He has been adjusting his insulin 15-20 units per day.    CKD d/w pt. Cr stable at 2.  D/w pt. we will send a copy of his recent labs to the renal clinic.  He isn't SOB.     Observations/Objective: nad Speech wnl  Assessment and Plan:  CKD.  Creatinine stable at 2.  We will send copy of his recent labs to the renal clinic.  He is not short of breath.  Okay for outpatient follow-up.  Diabetes.  A1c improved at 9.  Goal ~8  given his age, he has been adjusting his insulin as needed.  Continue work on diet.  Recheck periodically, in about 3-4 months.  Edema.  He'll update his legs and update me if the swelling isn't better.  His is still on 2.5 amlodipine.    Follow Up Instructions: see above.     I discussed the assessment and treatment plan with the patient. The patient was provided an opportunity to ask questions and all were answered. The patient agreed with the plan and demonstrated an understanding of the instructions.   The patient was advised to call back or seek an in-person evaluation if the symptoms worsen or if the condition fails to improve as anticipated.  I provided 16 minutes of non-face-to-face time during this encounter.   [name], MD

## 2019-06-27 NOTE — Assessment & Plan Note (Signed)
A1c improved at 9.  Goal ~8 given his age, he has been adjusting his insulin as needed.  Continue work on diet.  Recheck periodically, in about 3-4 months.

## 2019-06-27 NOTE — Assessment & Plan Note (Signed)
Creatinine stable at 2.  We will send copy of his recent labs to the renal clinic.  He is not short of breath.  Okay for outpatient follow-up.

## 2019-06-27 NOTE — Assessment & Plan Note (Signed)
He'll update his legs and update me if the swelling isn't better.  His is still on 2.5 amlodipine.

## 2019-06-28 ENCOUNTER — Encounter: Payer: Self-pay | Admitting: Family Medicine

## 2019-06-30 DIAGNOSIS — N184 Chronic kidney disease, stage 4 (severe): Secondary | ICD-10-CM | POA: Diagnosis not present

## 2019-06-30 DIAGNOSIS — N2581 Secondary hyperparathyroidism of renal origin: Secondary | ICD-10-CM | POA: Diagnosis not present

## 2019-06-30 DIAGNOSIS — D631 Anemia in chronic kidney disease: Secondary | ICD-10-CM | POA: Diagnosis not present

## 2019-06-30 DIAGNOSIS — I129 Hypertensive chronic kidney disease with stage 1 through stage 4 chronic kidney disease, or unspecified chronic kidney disease: Secondary | ICD-10-CM | POA: Diagnosis not present

## 2019-07-09 ENCOUNTER — Telehealth: Payer: Self-pay

## 2019-07-09 ENCOUNTER — Other Ambulatory Visit: Payer: Self-pay | Admitting: Family Medicine

## 2019-07-09 NOTE — Telephone Encounter (Signed)
Left detailed message on voicemail of [name] Small, [place]

## 2019-07-09 NOTE — Telephone Encounter (Signed)
Leatrice Jewels RN with [facility] said pt is participating with free BP program thru [facility]. The usual parameters are if 3 diastolic pressures are below 60 within one wk. Pts last 3 BP readings (Keyra did not get P). 07/07/19 BP 117/55 07/08/19 BP 130/59 07/09/19 BP 152/58 All readings are usually after pt takes his medications. Pt not having any symptoms. Do you want to change parameters so you will not get a wkly call.Please advise.

## 2019-07-09 NOTE — Telephone Encounter (Signed)
If his DBP is still >50 AND if he is not lightheaded, then I wouldn't change anything.  If DBP <50 or if lightheaded, then let me know.  Thanks.

## 2019-07-11 DIAGNOSIS — R062 Wheezing: Secondary | ICD-10-CM | POA: Diagnosis not present

## 2019-07-11 DIAGNOSIS — I509 Heart failure, unspecified: Secondary | ICD-10-CM | POA: Diagnosis not present

## 2019-07-11 DIAGNOSIS — J449 Chronic obstructive pulmonary disease, unspecified: Secondary | ICD-10-CM | POA: Diagnosis not present

## 2019-07-11 DIAGNOSIS — J9600 Acute respiratory failure, unspecified whether with hypoxia or hypercapnia: Secondary | ICD-10-CM | POA: Diagnosis not present

## 2019-07-23 ENCOUNTER — Ambulatory Visit (INDEPENDENT_AMBULATORY_CARE_PROVIDER_SITE_OTHER): Payer: Medicare Other

## 2019-07-23 DIAGNOSIS — Z23 Encounter for immunization: Secondary | ICD-10-CM

## 2019-07-24 DIAGNOSIS — J9600 Acute respiratory failure, unspecified whether with hypoxia or hypercapnia: Secondary | ICD-10-CM | POA: Diagnosis not present

## 2019-07-24 DIAGNOSIS — R062 Wheezing: Secondary | ICD-10-CM | POA: Diagnosis not present

## 2019-07-24 DIAGNOSIS — I509 Heart failure, unspecified: Secondary | ICD-10-CM | POA: Diagnosis not present

## 2019-07-24 DIAGNOSIS — J449 Chronic obstructive pulmonary disease, unspecified: Secondary | ICD-10-CM | POA: Diagnosis not present

## 2019-07-30 ENCOUNTER — Encounter: Payer: Self-pay | Admitting: Family Medicine

## 2019-08-02 ENCOUNTER — Telehealth: Payer: Self-pay

## 2019-08-02 ENCOUNTER — Other Ambulatory Visit: Payer: Self-pay

## 2019-08-02 ENCOUNTER — Ambulatory Visit (INDEPENDENT_AMBULATORY_CARE_PROVIDER_SITE_OTHER): Payer: Medicare Other

## 2019-08-02 DIAGNOSIS — I739 Peripheral vascular disease, unspecified: Secondary | ICD-10-CM | POA: Diagnosis not present

## 2019-08-02 NOTE — Telephone Encounter (Signed)
Spoke with the patients [facility] POA his wife [name]. [name] made aware of LE study results with verbalized understanding. [name] rqst a copy of the results be mailed. Orders in Epic for 1 yr repeat testing.

## 2019-08-02 NOTE — Telephone Encounter (Signed)
-----   Message from Theora Gianotti, NP sent at 08/02/2019 12:01 PM EDT ----- ABI: Improved numbers on the right and stable on the left. Duplex shows moderate disease in the left leg, which is stable. F/u studies in 1 yr.

## 2019-08-04 DIAGNOSIS — Z794 Long term (current) use of insulin: Secondary | ICD-10-CM | POA: Diagnosis not present

## 2019-08-04 DIAGNOSIS — E119 Type 2 diabetes mellitus without complications: Secondary | ICD-10-CM | POA: Diagnosis not present

## 2019-08-11 DIAGNOSIS — J449 Chronic obstructive pulmonary disease, unspecified: Secondary | ICD-10-CM | POA: Diagnosis not present

## 2019-08-11 DIAGNOSIS — J9600 Acute respiratory failure, unspecified whether with hypoxia or hypercapnia: Secondary | ICD-10-CM | POA: Diagnosis not present

## 2019-08-11 DIAGNOSIS — I509 Heart failure, unspecified: Secondary | ICD-10-CM | POA: Diagnosis not present

## 2019-08-11 DIAGNOSIS — R062 Wheezing: Secondary | ICD-10-CM | POA: Diagnosis not present

## 2019-08-23 DIAGNOSIS — I509 Heart failure, unspecified: Secondary | ICD-10-CM | POA: Diagnosis not present

## 2019-08-23 DIAGNOSIS — J449 Chronic obstructive pulmonary disease, unspecified: Secondary | ICD-10-CM | POA: Diagnosis not present

## 2019-08-23 DIAGNOSIS — J9600 Acute respiratory failure, unspecified whether with hypoxia or hypercapnia: Secondary | ICD-10-CM | POA: Diagnosis not present

## 2019-08-23 DIAGNOSIS — R062 Wheezing: Secondary | ICD-10-CM | POA: Diagnosis not present

## 2019-09-06 ENCOUNTER — Other Ambulatory Visit: Payer: Self-pay | Admitting: Cardiovascular Disease

## 2019-09-10 DIAGNOSIS — J449 Chronic obstructive pulmonary disease, unspecified: Secondary | ICD-10-CM | POA: Diagnosis not present

## 2019-09-10 DIAGNOSIS — I509 Heart failure, unspecified: Secondary | ICD-10-CM | POA: Diagnosis not present

## 2019-09-10 DIAGNOSIS — J9600 Acute respiratory failure, unspecified whether with hypoxia or hypercapnia: Secondary | ICD-10-CM | POA: Diagnosis not present

## 2019-09-10 DIAGNOSIS — R062 Wheezing: Secondary | ICD-10-CM | POA: Diagnosis not present

## 2019-09-23 DIAGNOSIS — J9600 Acute respiratory failure, unspecified whether with hypoxia or hypercapnia: Secondary | ICD-10-CM | POA: Diagnosis not present

## 2019-09-23 DIAGNOSIS — I509 Heart failure, unspecified: Secondary | ICD-10-CM | POA: Diagnosis not present

## 2019-09-23 DIAGNOSIS — R062 Wheezing: Secondary | ICD-10-CM | POA: Diagnosis not present

## 2019-09-23 DIAGNOSIS — J449 Chronic obstructive pulmonary disease, unspecified: Secondary | ICD-10-CM | POA: Diagnosis not present

## 2019-10-11 DIAGNOSIS — J9600 Acute respiratory failure, unspecified whether with hypoxia or hypercapnia: Secondary | ICD-10-CM | POA: Diagnosis not present

## 2019-10-11 DIAGNOSIS — R062 Wheezing: Secondary | ICD-10-CM | POA: Diagnosis not present

## 2019-10-11 DIAGNOSIS — J449 Chronic obstructive pulmonary disease, unspecified: Secondary | ICD-10-CM | POA: Diagnosis not present

## 2019-10-11 DIAGNOSIS — I509 Heart failure, unspecified: Secondary | ICD-10-CM | POA: Diagnosis not present

## 2019-10-20 ENCOUNTER — Encounter: Payer: Self-pay | Admitting: Family Medicine

## 2019-10-21 ENCOUNTER — Other Ambulatory Visit: Payer: Self-pay | Admitting: Family Medicine

## 2019-10-21 DIAGNOSIS — Z794 Long term (current) use of insulin: Secondary | ICD-10-CM

## 2019-10-21 DIAGNOSIS — E1142 Type 2 diabetes mellitus with diabetic polyneuropathy: Secondary | ICD-10-CM

## 2019-10-22 ENCOUNTER — Other Ambulatory Visit (INDEPENDENT_AMBULATORY_CARE_PROVIDER_SITE_OTHER): Payer: Medicare Other

## 2019-10-22 ENCOUNTER — Other Ambulatory Visit: Payer: Self-pay

## 2019-10-22 DIAGNOSIS — E1142 Type 2 diabetes mellitus with diabetic polyneuropathy: Secondary | ICD-10-CM

## 2019-10-22 DIAGNOSIS — Z794 Long term (current) use of insulin: Secondary | ICD-10-CM | POA: Diagnosis not present

## 2019-10-22 LAB — COMPREHENSIVE METABOLIC PANEL
ALT: 10 U/L (ref 0–53)
AST: 12 U/L (ref 0–37)
Albumin: 4 g/dL (ref 3.5–5.2)
Alkaline Phosphatase: 53 U/L (ref 39–117)
BUN: 44 mg/dL — ABNORMAL HIGH (ref 6–23)
CO2: 34 mEq/L — ABNORMAL HIGH (ref 19–32)
Calcium: 8.9 mg/dL (ref 8.4–10.5)
Chloride: 96 mEq/L (ref 96–112)
Creatinine, Ser: 2.26 mg/dL — ABNORMAL HIGH (ref 0.40–1.50)
GFR: 27.46 mL/min — ABNORMAL LOW (ref >60.00)
Glucose, Bld: 311 mg/dL — ABNORMAL HIGH (ref 70–99)
Potassium: 4.9 mEq/L (ref 3.5–5.1)
Sodium: 136 mEq/L (ref 135–145)
Total Bilirubin: 0.6 mg/dL (ref 0.2–1.2)
Total Protein: 6.9 g/dL (ref 6.0–8.3)

## 2019-10-22 LAB — HEMOGLOBIN A1C: Hgb A1c MFr Bld: 9.9 % — ABNORMAL HIGH (ref 4.6–6.5)

## 2019-10-22 LAB — LIPID PANEL
Cholesterol: 129 mg/dL (ref 0–200)
HDL: 43 mg/dL (ref >39.00)
LDL Cholesterol: 61 mg/dL (ref 0–99)
NonHDL: 85.95
Total CHOL/HDL Ratio: 3
Triglycerides: 123 mg/dL (ref 0.0–149.0)
VLDL: 24.6 mg/dL (ref 0.0–40.0)

## 2019-10-23 DIAGNOSIS — J449 Chronic obstructive pulmonary disease, unspecified: Secondary | ICD-10-CM | POA: Diagnosis not present

## 2019-10-23 DIAGNOSIS — R062 Wheezing: Secondary | ICD-10-CM | POA: Diagnosis not present

## 2019-10-23 DIAGNOSIS — J9600 Acute respiratory failure, unspecified whether with hypoxia or hypercapnia: Secondary | ICD-10-CM | POA: Diagnosis not present

## 2019-10-23 DIAGNOSIS — I509 Heart failure, unspecified: Secondary | ICD-10-CM | POA: Diagnosis not present

## 2019-10-26 ENCOUNTER — Ambulatory Visit (INDEPENDENT_AMBULATORY_CARE_PROVIDER_SITE_OTHER): Payer: Medicare Other | Admitting: Family Medicine

## 2019-10-26 ENCOUNTER — Other Ambulatory Visit: Payer: Self-pay

## 2019-10-26 DIAGNOSIS — J438 Other emphysema: Secondary | ICD-10-CM

## 2019-10-26 DIAGNOSIS — E1142 Type 2 diabetes mellitus with diabetic polyneuropathy: Secondary | ICD-10-CM

## 2019-10-26 DIAGNOSIS — E785 Hyperlipidemia, unspecified: Secondary | ICD-10-CM | POA: Diagnosis not present

## 2019-10-26 DIAGNOSIS — Z794 Long term (current) use of insulin: Secondary | ICD-10-CM

## 2019-10-26 DIAGNOSIS — N183 Chronic kidney disease, stage 3 unspecified: Secondary | ICD-10-CM

## 2019-10-26 NOTE — Progress Notes (Signed)
Virtual visit completed through WebEx or similar program Patient location: home  Provider location: Financial controller at [facility], office   Pandemic considerations d/w pt.   Limitations and rationale for visit method d/w patient.  Patient agreed to proceed.   CC: DM2 f/u.   HPI:  DM2.  A1c 9.9.  Foot considerations d/w pt.  No new foot changes.  He has missed some doses of insulin, about once a week.  Sugar can get down to ~100 with med adherence. He has missed about 15% of his doses and that could affect his A1c, d/w pt. A 15% decrease in his A1c would put him around 8.4 which would be a reasonable goal given his age.  Rare lower sugar, corrected with a snack.  Foot care discussed with patient.  No new concerns.  covid considerations d/w pt.    Patient fell yesterday.  No injury.  Cautions d/w pt.  "I tripped on my pajamas."  Wife is supportive, helping patient.    Cr elevation, 2.2. he has seen Dr. Posey Pronto with renal.  Baseline 2-2.2.  We will send a copy of his labs to the renal clinic.  He is off spiriva and he couldn't tell a difference off med.  His breathing is usually good, with only prn O2 use.  Pulse 94% RA this AM.  Pulse 60-70s.    Elevated Cholesterol: Using medications without problems:yes Muscle aches: no Diet compliance:encouraged, d/w pt.   Exercise: limited    Recent Results (from the past 2160 hour(s))  future Hemoglobin A1c     Status: Abnormal   Collection Time: 10/22/19  9:11 AM  Result Value Ref Range   Hgb A1c MFr Bld 9.9 (H) 4.6 - 6.5 %    Comment: Glycemic Control Guidelines for People with Diabetes:Non Diabetic:  <6%Goal of Therapy: <7%Additional Action Suggested:  >8%   future Lipid panel     Status: None   Collection Time: 10/22/19  9:11 AM  Result Value Ref Range   Cholesterol 129 0 - 200 mg/dL    Comment: ATP III Classification       Desirable:  < 200 mg/dL               Borderline High:  200 - 239 mg/dL          High:  > = 240 mg/dL   Triglycerides 123.0  0.0 - 149.0 mg/dL    Comment: Normal:  <150 mg/dLBorderline High:  150 - 199 mg/dL   HDL 43.00 >39.00 mg/dL   VLDL 24.6 0.0 - 40.0 mg/dL   LDL Cholesterol 61 0 - 99 mg/dL   Total CHOL/HDL Ratio 3     Comment:                Men          Women1/2 Average Risk     3.4          3.3Average Risk          5.0          4.42X Average Risk          9.6          7.13X Average Risk          15.0          11.0                       NonHDL 85.95     Comment: NOTE:  Non-HDL  goal should be 30 mg/dL higher than patient's LDL goal (i.e. LDL goal of < 70 mg/dL, would have non-HDL goal of < 100 mg/dL)  future Comprehensive metabolic panel     Status: Abnormal   Collection Time: 10/22/19  9:11 AM  Result Value Ref Range   Sodium 136 135 - 145 mEq/L   Potassium 4.9 3.5 - 5.1 mEq/L   Chloride 96 96 - 112 mEq/L   CO2 34 (H) 19 - 32 mEq/L   Glucose, Bld 311 (H) 70 - 99 mg/dL   BUN 44 (H) 6 - 23 mg/dL   Creatinine, Ser 2.26 (H) 0.40 - 1.50 mg/dL   Total Bilirubin 0.6 0.2 - 1.2 mg/dL   Alkaline Phosphatase 53 39 - 117 U/L   AST 12 0 - 37 U/L   ALT 10 0 - 53 U/L   Total Protein 6.9 6.0 - 8.3 g/dL   Albumin 4.0 3.5 - 5.2 g/dL   GFR 27.46 (L) >60.00 mL/min   Calcium 8.9 8.4 - 10.5 mg/dL   PMH and SH reviewed  ROS: Per HPI unless specifically indicated in ROS section   Meds, vitals, and allergies reviewed.   NAD Speech wnl  A/P: DM2.  A1c 9.9.  Foot considerations d/w pt.  No new foot changes.  He has missed some doses of insulin, about once a week.  Sugar can get down to ~100 with med adherence. He has missed about 15% of his doses and that could affect his A1c, d/w pt. A 15% decrease in his A1c would put him around 8.4 which would be a reasonable goal given his age.  Rare lower sugar, corrected with a snack.  He will work on medication adherence and we can recheck periodically.  covid considerations d/w pt.    Patient fell yesterday.  No injury.  Cautions d/w pt.  "I tripped on my pajamas."  Wife is  supportive, helping patient.  They will update me as needed.  Cr elevation, 2.2. he has seen Dr. Posey Pronto with renal.  Baseline 2-2.2.  We will send a copy of his labs to the renal clinic.  He is off spiriva and he couldn't tell a difference off med.  His breathing is usually good, with only prn O2 use.  Pulse 94% RA this AM.  Pulse 60-70s.  Continue as is for now.  Elevated Cholesterol: No change in meds at this point.  Continue as is.   >25 minutes spent in face to face time with patient, >50% spent in counselling or coordination of care

## 2019-10-28 ENCOUNTER — Encounter: Payer: Self-pay | Admitting: Family Medicine

## 2019-10-28 NOTE — Assessment & Plan Note (Signed)
No change in meds at this point.  Continue as is.  He agrees.

## 2019-10-28 NOTE — Assessment & Plan Note (Signed)
He is off spiriva and he couldn't tell a difference off med.  His breathing is usually good, with only prn O2 use.  Pulse 94% RA this AM.  Pulse 60-70s.  Continue as is for now.

## 2019-10-28 NOTE — Assessment & Plan Note (Signed)
A1c 9.9.  Foot considerations d/w pt.  No new foot changes.  He has missed some doses of insulin, about once a week.  Sugar can get down to ~100 with med adherence. He has missed about 15% of his doses and that could affect his A1c, d/w pt. A 15% decrease in his A1c would put him around 8.4 which would be a reasonable goal given his age.  Rare lower sugar, corrected with a snack.  He will work on medication adherence and we can recheck periodically.

## 2019-10-28 NOTE — Assessment & Plan Note (Signed)
Cr elevation, 2.2. he has seen Dr. Posey Pronto with renal.  Baseline 2-2.2.  We will send a copy of his labs to the renal clinic.

## 2019-11-03 DIAGNOSIS — E119 Type 2 diabetes mellitus without complications: Secondary | ICD-10-CM | POA: Diagnosis not present

## 2019-11-03 DIAGNOSIS — Z794 Long term (current) use of insulin: Secondary | ICD-10-CM | POA: Diagnosis not present

## 2019-11-10 DIAGNOSIS — R062 Wheezing: Secondary | ICD-10-CM | POA: Diagnosis not present

## 2019-11-10 DIAGNOSIS — J9600 Acute respiratory failure, unspecified whether with hypoxia or hypercapnia: Secondary | ICD-10-CM | POA: Diagnosis not present

## 2019-11-10 DIAGNOSIS — J449 Chronic obstructive pulmonary disease, unspecified: Secondary | ICD-10-CM | POA: Diagnosis not present

## 2019-11-10 DIAGNOSIS — I509 Heart failure, unspecified: Secondary | ICD-10-CM | POA: Diagnosis not present

## 2019-11-23 DIAGNOSIS — J9600 Acute respiratory failure, unspecified whether with hypoxia or hypercapnia: Secondary | ICD-10-CM | POA: Diagnosis not present

## 2019-11-23 DIAGNOSIS — I509 Heart failure, unspecified: Secondary | ICD-10-CM | POA: Diagnosis not present

## 2019-11-23 DIAGNOSIS — J449 Chronic obstructive pulmonary disease, unspecified: Secondary | ICD-10-CM | POA: Diagnosis not present

## 2019-11-23 DIAGNOSIS — R062 Wheezing: Secondary | ICD-10-CM | POA: Diagnosis not present

## 2019-11-26 ENCOUNTER — Other Ambulatory Visit: Payer: Self-pay | Admitting: Cardiovascular Disease

## 2019-12-04 ENCOUNTER — Encounter (HOSPITAL_COMMUNITY): Payer: Self-pay | Admitting: Emergency Medicine

## 2019-12-04 ENCOUNTER — Other Ambulatory Visit: Payer: Self-pay

## 2019-12-04 ENCOUNTER — Emergency Department (HOSPITAL_COMMUNITY)
Admission: EM | Admit: 2019-12-04 | Discharge: 2019-12-04 | Disposition: A | Discharge status: Home / Self Care | Payer: Medicare Other | Attending: Emergency Medicine | Admitting: Emergency Medicine

## 2019-12-04 DIAGNOSIS — S61111A Laceration without foreign body of right thumb with damage to nail, initial encounter: Secondary | ICD-10-CM | POA: Insufficient documentation

## 2019-12-04 DIAGNOSIS — Z794 Long term (current) use of insulin: Secondary | ICD-10-CM | POA: Diagnosis not present

## 2019-12-04 DIAGNOSIS — Y93G1 Activity, food preparation and clean up: Secondary | ICD-10-CM | POA: Diagnosis not present

## 2019-12-04 DIAGNOSIS — Z7901 Long term (current) use of anticoagulants: Secondary | ICD-10-CM | POA: Diagnosis not present

## 2019-12-04 DIAGNOSIS — Y999 Unspecified external cause status: Secondary | ICD-10-CM | POA: Insufficient documentation

## 2019-12-04 DIAGNOSIS — I509 Heart failure, unspecified: Secondary | ICD-10-CM | POA: Insufficient documentation

## 2019-12-04 DIAGNOSIS — N183 Chronic kidney disease, stage 3 unspecified: Secondary | ICD-10-CM | POA: Insufficient documentation

## 2019-12-04 DIAGNOSIS — J449 Chronic obstructive pulmonary disease, unspecified: Secondary | ICD-10-CM | POA: Diagnosis not present

## 2019-12-04 DIAGNOSIS — Z87891 Personal history of nicotine dependence: Secondary | ICD-10-CM | POA: Insufficient documentation

## 2019-12-04 DIAGNOSIS — I2510 Atherosclerotic heart disease of native coronary artery without angina pectoris: Secondary | ICD-10-CM | POA: Insufficient documentation

## 2019-12-04 DIAGNOSIS — Y281XXA Contact with knife, undetermined intent, initial encounter: Secondary | ICD-10-CM | POA: Insufficient documentation

## 2019-12-04 DIAGNOSIS — I130 Hypertensive heart and chronic kidney disease with heart failure and stage 1 through stage 4 chronic kidney disease, or unspecified chronic kidney disease: Secondary | ICD-10-CM | POA: Insufficient documentation

## 2019-12-04 DIAGNOSIS — E1122 Type 2 diabetes mellitus with diabetic chronic kidney disease: Secondary | ICD-10-CM | POA: Insufficient documentation

## 2019-12-04 DIAGNOSIS — Y92000 Kitchen of unspecified non-institutional (private) residence as  the place of occurrence of the external cause: Secondary | ICD-10-CM | POA: Diagnosis not present

## 2019-12-04 DIAGNOSIS — Z79899 Other long term (current) drug therapy: Secondary | ICD-10-CM | POA: Insufficient documentation

## 2019-12-04 MED ORDER — CEPHALEXIN 500 MG PO CAPS: 500.0000 mg | ORAL_CAPSULE | Freq: Two times a day (BID) | ORAL | 0 refills | Status: DC

## 2019-12-04 MED ORDER — FENTANYL CITRATE (PF) 100 MCG/2ML IJ SOLN: 50.0000 ug | Freq: Once | INTRAMUSCULAR | Status: DC

## 2019-12-04 MED ORDER — TETANUS-DIPHTH-ACELL PERTUSSIS 5-2.5-18.5 LF-MCG/0.5 IM SUSP: 0.5000 mL | Freq: Once | INTRAMUSCULAR | Status: DC

## 2019-12-04 MED ORDER — LIDOCAINE HCL (PF) 1 % IJ SOLN
20.0000 mL | Freq: Once | INTRAMUSCULAR | Status: AC
  Administered 2019-12-04: 20 mL via INTRADERMAL
  Filled 2019-12-04: qty 20

## 2019-12-04 NOTE — ED Notes (Signed)
Attempted to call pt's family x3. Pt states they are on the way. Pt is alert/oriented x4. Pt wants to wait in the waiting room. Sort RN will monitor pt and facilitate pt's d/c from the waiting room.

## 2019-12-04 NOTE — ED Notes (Signed)
Please call the daughter Arrie Aran that is with the wife wanting an update [phone]

## 2019-12-04 NOTE — ED Triage Notes (Signed)
Pt states he was slicing potatoes and sliced his right thumb. Skin missing from right lateral side of thumb to nail bed. Still bleeding- pressure applied. Pt states he is on blood thinners.

## 2019-12-04 NOTE — Discharge Instructions (Addendum)
You were seen in the emergency room for a thumb laceration.  It was repaired via cautery, see your PCP in 2 days for wound recheck.  I am sending you home with antibiotics secondary to your diabetes and heart failure, if you experience more bleeding, fevers or swelling, purulent discharge from the wound please return to the emergency department.

## 2019-12-04 NOTE — ED Provider Notes (Signed)
[facility] EMERGENCY DEPARTMENT Provider Note   CSN: [id number] Arrival date & time: 12/04/19  1430     History Chief Complaint  Patient presents with  . Finger Injury    [name] Small is a 86 y.o. male.  HPI 86 year old male with history of BPH, CAD, heart failure, diabetes, presenting to the emergency department for a right thumb injury. Patient states he was slicing potatoes when the knife slipped and he cut his right thumb. Patient suffered a clean laceration to the medial aspect of the right thumb including the medial third of the nail. Patient states that he had continued bleeding and wanted to evaluate the emergency department. Denies any hand injury otherwise, no wrist injuries, denies any other extremity injuries. States he does not know when his last tetanus shot was. Patient is currently on Plavix but no other blood thinners.5    Past Medical History:  Diagnosis Date  . (HFpEF) heart failure with preserved ejection fraction ([place])   . Arthritis   . Back pain    s/p lumbar injection 2014  . BENIGN PROSTATIC HYPERTROPHY 05/21/2007  . Bladder cancer ([place]) 10/2011  . CAD (coronary artery disease)    nonobstructive by cath 6/12:  mid LAD 30%, proximal obtuse marginal-2 30%, proximal RCA 20%, mid RCA 30-40%.  He had normal cardiac output and mildly elevated filling pressures but no significant pulmonary hypertension;   Echocardiogram in May 2012 demonstrated EF 50-55% and left atrial enlargement   . Cellulitis of left foot    Hospitalized in 2006  . Chronic kidney disease (CKD), stage III (moderate) 12/04/2007   FOLLOWED BY DR PATEL  . Chronic pain of lower extremity   . Complication of anesthesia    1 time bladder cancer surgery- 2012 , oxygen satruratioon dropped had to stay overnight, no problems since then.  Marland Kitchen COPD (chronic obstructive pulmonary disease) ([place])   . DIABETES MELLITUS, TYPE II 05/21/2007  . DIVERTICULOSIS, COLON 05/21/2007   pt denies   . Dyspnea    with exertion, patient mointors saturation  . Fatigue AGE-RELATED  . Gross hematuria    pt denies  . HYPERTENSION 05/21/2007  . Impaired hearing BILATERAL HEARING AIDS  . On home oxygen therapy    "2L at nighttime" (02/28/2016)  . PAD (peripheral artery disease) ([place])    a. 02/2016 L foot nonhealing ulcer-->Periph angio: L pop 100 w/ reconstitution via extensive vollats to the prox-mid peroneal (only patent vessel BK)-->PTA of L Pop and peroneal; b. Ongoing LE ischemia 5 & 06/2016 req amputation of toes on L foot.  . Pneumonia    Hx  of   . Psoriasis ELBOWS  . Psoriasis   . PSORIASIS, SCALP 10/25/2008    Patient Active Problem List   Diagnosis Date Noted  . Anemia 01/31/2017  . Hypoxia   . History of hip replacement 12/26/2016    Class: Chronic  . Status post lumbar laminectomy 12/26/2016    Class: Chronic  . S/p left hip fracture 12/26/2016  . Idiopathic chronic venous hypertension of left lower extremity with inflammation 11/07/2016  . Spinal stenosis of lumbar region with neurogenic claudication 10/18/2016  . Spinal stenosis of lumbar region 09/25/2016  . Fall 07/04/2016  . COPD (chronic obstructive pulmonary disease) ([place]) 06/10/2016  . Weakness 03/18/2016  . Critical lower limb ischemia 02/28/2016  . Peripheral arterial disease ([place]) 10/17/2015  . Edema 07/19/2015  . Claudication ([place]) 01/31/2015  . DOE (dyspnea on exertion) 01/31/2015  . Hyperlipidemia 01/31/2015  .  HTN (hypertension) 01/31/2015  . Chronic diastolic CHF (congestive heart failure) ([place]) 01/21/2014  . Atrial fibrillation ([employer]) 12/16/2012  . Hyperkalemia 11/27/2011  . Bladder cancer ([place]) 09/22/2011  . Leg cramps 08/28/2011  . Cough 05/26/2011  . CAD (coronary artery disease) of artery bypass graft 05/09/2011  . Dyspnea on exertion 04/19/2011  . PSORIASIS, SCALP 10/25/2008  . Chronic kidney disease, stage III (moderate) 12/04/2007  . Type 2 diabetes mellitus with diabetic polyneuropathy  ([place]) 05/21/2007  . DIVERTICULOSIS, COLON 05/21/2007  . BPH (benign prostatic hyperplasia) 05/21/2007    Past Surgical History:  Procedure Laterality Date  . AMPUTATION Left 03/27/2016   Procedure: partial first AMPUTATION RAY; LEFT;  Surgeon: Trula Slade, DPM;  Location: [facility];  Service: Podiatry;  Laterality: Left;  . AMPUTATION Left 06/12/2016   Procedure: TRANSMETATARSAL AMPUTATION LEFT FOOT;  Surgeon: Newt Minion, MD;  Location: [facility];  Service: Orthopedics;  Laterality: Left;  . APPENDECTOMY  1941  . CARDIAC CATHETERIZATION  04-24-11/  DR Rogue Jury ARIDA   MILD NONOBSTRUCTIVE CAD, [place]MA CARDIAC OUTPUT  . CARDIOVASCULAR STRESS TEST  2007  . CATARACT EXTRACTION W/ INTRAOCULAR LENS  IMPLANT, BILATERAL Bilateral ~ 2010  . CYSTOSCOPY  12/25/2011   Procedure: CYSTOSCOPY;  Surgeon: Molli Hazard, MD;  Location: [facility];  Service: Urology;  Laterality: N/A;  needs intubation and to be paralyzed   . CYSTOSCOPY W/ RETROGRADES  10/30/2011   Procedure: CYSTOSCOPY WITH RETROGRADE PYELOGRAM;  Surgeon: Molli Hazard, MD;  [facility];  Service: Urology;  Laterality: Bilateral;  CYSTOSCOPY POSS TURBT BILATERAL RETROGRADE PYLEOGRAM   . CYSTOSCOPY W/ RETROGRADES  11/30/2012   Procedure: CYSTOSCOPY WITH RETROGRADE PYELOGRAM;  Surgeon: Molli Hazard, MD;  Location: [facility];  Service: Urology;  Laterality: Bilateral;  Flexible cystoscopy.  . CYSTOSCOPY WITH BIOPSY  11/30/2012   Procedure: CYSTOSCOPY WITH BIOPSY;  Surgeon: Molli Hazard, MD;  Location[facility];  Service: Urology;  Laterality: N/A;  . INCISION AND DRAINAGE FOOT Left ~ 2005   LEFT FOOT DUE TO INFECTION FROM  NAIL PUNCTURE INJURY  . LUMBAR LAMINECTOMY/DECOMPRESSION MICRODISCECTOMY N/A 10/18/2016   Procedure: LEFT AND CENTRAL L4-5 LUMBAR LAMINECTOMY WITH RESECTION OF SYNOVIAL CYST;  Surgeon: Jessy Oto, MD;  Location: [facility];   Service: Orthopedics;  Laterality: N/A;  . PENILE PROSTHESIS IMPLANT  1990s  . PERIPHERAL VASCULAR CATHETERIZATION N/A 02/14/2016   Procedure: Abdominal Aortogram w/Lower Extremity;  Surgeon: Wellington Hampshire, MD;  Location: [facility];  Service: Cardiovascular;  Laterality: N/A;  . PERIPHERAL VASCULAR CATHETERIZATION Left 02/28/2016   Procedure: Peripheral Vascular Balloon Angioplasty;  Surgeon: Wellington Hampshire, MD;  Location: [facility];  Service: Cardiovascular;  Laterality: Left;  left peroneal and popliteal artery  . [place]  . TOTAL HIP ARTHROPLASTY Left 12/27/2016   Procedure: LEFT TOTAL HIP ARTHROPLASTY ANTERIOR APPROACH;  Surgeon: Mcarthur Rossetti, MD;  Location: [facility];  Service: Orthopedics;  Laterality: Left;  . TRANSURETHRAL RESECTION OF BLADDER TUMOR  10/30/2011   Procedure: TRANSURETHRAL RESECTION OF BLADDER TUMOR (TURBT);  Surgeon: Molli Hazard, MD;  Location[facility];  Service: Urology;  Laterality: N/A;  . TRANSURETHRAL RESECTION OF BLADDER TUMOR  12/25/2011   Procedure: TRANSURETHRAL RESECTION OF BLADDER TUMOR (TURBT);  Surgeon: Molli Hazard, MD;  Location: [facility];  Service: Urology;  Laterality: N/A;  need long gyrus instruments   . VASECTOMY  1990s       Family History  Problem Relation Age of Onset  . Diabetes Mother   . Heart disease Brother   . Heart disease Brother   . Heart disease Brother   . Heart disease Brother   . Heart disease Brother   . Heart disease Brother   . Lung cancer Brother     Social History   Tobacco Use  . Smoking status: Former Smoker    Packs/day: 2.00    Years: 22.00    Pack years: 44.00    Types: Cigarettes    Quit date: 11/11/1968    Years since quitting: 51.0  . Smokeless tobacco: Never Used  Substance Use Topics  . Alcohol use: No    Comment: occassional- 10/17/16- none  in 4 months- "too sick"  . Drug use: No    Home  Medications Prior to Admission medications   Medication Sig Start Date End Date Taking? Authorizing Provider  acetaminophen (TYLENOL) 500 MG tablet Take 1,000 mg by mouth every 8 (eight) hours as needed for mild pain, moderate pain or headache.    [provider]  amLODipine (NORVASC) 2.5 MG tablet TAKE 1 TABLET BY MOUTH  DAILY 11/29/19   Wellington Hampshire, MD  Apremilast 30 MG TABS Take 1 tablet by mouth daily. 03/30/19   Tonia Ghent, MD  atorvastatin (LIPITOR) 10 MG tablet TAKE 1 TABLET BY MOUTH  DAILY 07/12/19   Tonia Ghent, MD  budesonide-formoterol [facility]) 160-4.5 MCG/ACT inhaler Inhale 2 puffs into the lungs 2 (two) times daily as needed (shortness of breath).  01/21/14   Tonia Ghent, MD  cephALEXin (KEFLEX) 500 MG capsule Take 1 capsule (500 mg total) by mouth 2 (two) times daily. 12/04/19   Kizzie Fantasia, MD  clobetasol (TEMOVATE) 0.05 % external solution Apply 1 application topically daily.    [provider]  clopidogrel (PLAVIX) 75 MG tablet TAKE 1 TABLET BY MOUTH  DAILY 06/18/19   Tonia Ghent, MD  furosemide (LASIX) 40 MG tablet TAKE 1 TABLET BY MOUTH  EVERY MONDAY, WEDNESDAY,  AND FRIDAY 06/11/19   Tonia Ghent, MD  glipiZIDE (GLUCOTROL XL) 5 MG 24 hr tablet TAKE 1 TABLET BY MOUTH  DAILY WITH BREAKFAST 06/18/19   Tonia Ghent, MD  insulin glargine (LANTUS) 100 UNIT/ML injection Inject 0.15 mLs (15 Units total) into the skin daily. 10/17/16   Tonia Ghent, MD  nystatin (MYCOSTATIN/NYSTOP) powder Apply to affected area twice a day 05/11/19   Tonia Ghent, MD  tiotropium [facility]) 18 MCG inhalation capsule Place 1 capsule (18 mcg total) into inhaler and inhale daily. Patient not taking: Reported on 10/26/2019 09/25/18   Tonia Ghent, MD    Allergies    Actos [pioglitazone hydrochloride], Aspirin, Ace inhibitors, Bactrim, Gabapentin, Lyrica [pregabalin], Pravastatin, and Sulfa drugs cross reactors  Review of Systems   Review of  Systems  Constitutional: Negative for chills and fever.  HENT: Negative for ear pain and sore throat.   Eyes: Negative for pain and visual disturbance.  Respiratory: Negative for cough and shortness of breath.   Cardiovascular: Negative for chest pain and palpitations.  Gastrointestinal: Negative for abdominal pain and vomiting.  Genitourinary: Negative for dysuria and hematuria.  Musculoskeletal: Negative for arthralgias and back pain.  Skin: Positive for wound. Negative for color change and rash.  Neurological: Negative for seizures and syncope.  All other systems reviewed and are negative.   Physical Exam Updated Vital Signs  BP (!) 142/62 (BP Location: Right Arm)   Pulse 87   Temp 98.1 F (36.7 C) (Oral)   Resp 16   SpO2 92%   Physical Exam Vitals and nursing note reviewed.  Constitutional:      General: He is not in acute distress.    Appearance: He is well-developed. He is not ill-appearing, toxic-appearing or diaphoretic.  HENT:     Head: Normocephalic and atraumatic.  Eyes:     Conjunctiva/sclera: Conjunctivae normal.  Cardiovascular:     Rate and Rhythm: Normal rate and regular rhythm.     Heart sounds: No murmur.  Pulmonary:     Effort: Pulmonary effort is normal. No respiratory distress.     Breath sounds: Normal breath sounds.  Abdominal:     Palpations: Abdomen is soft.     Tenderness: There is no abdominal tenderness.  Musculoskeletal:        General: Deformity and signs of injury present.     Cervical back: Neck supple.     Comments: Clean laceration through the medial aspect of the thumb, including nail. No soft tissue present that's salvageable. Wound bed without no bone visible.   Skin:    General: Skin is warm and dry.     Capillary Refill: Capillary refill takes less than 2 seconds.  Neurological:     General: No focal deficit present.     Mental Status: He is alert.  Psychiatric:        Mood and Affect: Mood normal.        Behavior: Behavior  normal.     ED Results / Procedures / Treatments   Labs (all labs ordered are listed, but only abnormal results are displayed) Labs Reviewed - No data to display  EKG None  Radiology No results found.  Procedures Wound repair  Date/Time: 12/04/2019 3:39 PM Performed by: Kizzie Fantasia, MD Authorized by: Elnora Morrison, MD  Consent: Verbal consent obtained. Site marked: the operative site was marked Imaging studies: imaging studies available Required items: required blood products, implants, devices, and special equipment available Patient identity confirmed: verbally with patient, provided demographic data and arm band Time out: Immediately prior to procedure a "time out" was called to verify the correct patient, procedure, equipment, support staff and site/side marked as required. Preparation: Patient was prepped and draped in the usual sterile fashion. Local anesthesia used: yes Anesthesia: digital block  Anesthesia: Local anesthesia used: yes Local Anesthetic: lidocaine 1% without epinephrine Anesthetic total: 10 mL  Sedation: Patient sedated: no  Patient tolerance: patient tolerated the procedure well with no immediate complications    (including critical care time)  Medications Ordered in ED Medications  Tdap (BOOSTRIX) injection 0.5 mL (0.5 mLs Intramuscular Not Given 12/04/19 1522)  fentaNYL (SUBLIMAZE) injection 50 mcg (50 mcg Intravenous Not Given 12/04/19 1613)  lidocaine (PF) (XYLOCAINE) 1 % injection 20 mL (20 mLs Intradermal Given 12/04/19 1526)    ED Course  I have reviewed the triage vital signs and the nursing notes.  Pertinent labs & imaging results that were available during my care of the patient were reviewed by me and considered in my medical decision making (see chart for details).    MDM Rules/Calculators/A&P                      86yo M presenting with thumb laceration. See PE. No actual tissue to repair. Clean cut through the thumb. Slow  ooze of bleed present. Wound repaired with cautery,  digital block. No need for XR's at this time, no bone visible.  Dressing applied, non stick. Antibiotics given on discharge given his extensive co-morbidities. DC with return precautions, will f/u with PCP for further wound check, and care.   The attending physician was present and available for all medical decision making and procedures related to this patient's care.  Final Clinical Impression(s) / ED Diagnoses Final diagnoses:  Laceration of right thumb with damage to nail, foreign body presence unspecified, initial encounter    Rx / DC Orders ED Discharge Orders         Ordered    cephALEXin (KEFLEX) 500 MG capsule  2 times daily     12/04/19 1616           Kizzie Fantasia, MD 12/04/19 1619    Elnora Morrison, MD 12/04/19 1924

## 2019-12-06 ENCOUNTER — Encounter: Payer: Self-pay | Admitting: Family Medicine

## 2019-12-06 ENCOUNTER — Ambulatory Visit (INDEPENDENT_AMBULATORY_CARE_PROVIDER_SITE_OTHER): Payer: Medicare Other | Admitting: Family Medicine

## 2019-12-06 ENCOUNTER — Other Ambulatory Visit: Payer: Self-pay

## 2019-12-06 VITALS — BP 102/50 | HR 84 | Temp 98.2°F | Ht 74.0 in | Wt 209.4 lb

## 2019-12-06 DIAGNOSIS — Z23 Encounter for immunization: Secondary | ICD-10-CM | POA: Diagnosis not present

## 2019-12-06 DIAGNOSIS — S61111A Laceration without foreign body of right thumb with damage to nail, initial encounter: Secondary | ICD-10-CM

## 2019-12-06 NOTE — Progress Notes (Signed)
   Subjective:    Patient ID: [name] Small, male    DOB: February 09, 1928, 86 y.o.   MRN: [id number]  HPI Chief Complaint  Patient presents with  . Wound Check    Pt cut right thumb on saturday 1/23. Pt having pain at sight. Pt states they had to cauterize to get the bleeding to stop. Pt started on Keflex 1/24, pt has taken once dose so far.    This is an 86 yo male, accompanied by his wife, who presents today for follow up of right thumb wound. He was using a mandolin to cut potatoes when he sliced the medial edge of right thumb. He had significant bleeding, was seen in ER. Local block performed and wound was cauterized. Dressing applied. They have not removed dressing, concerned about bleeding. Taking cephalexin.   Some pain, resolved with Tylenol. No interference with sleeping.    Review of Systems Per HPI    Objective:   Physical Exam Vitals reviewed.  Constitutional:      General: He is not in acute distress.    Appearance: Normal appearance. He is normal weight. He is not ill-appearing, toxic-appearing or diaphoretic.  HENT:     Head: Normocephalic and atraumatic.  Eyes:     Conjunctiva/sclera: Conjunctivae normal.  Cardiovascular:     Rate and Rhythm: Normal rate.  Pulmonary:     Effort: Pulmonary effort is normal.  Skin:    General: Skin is warm and dry.     Findings: Wound present.     Comments: Right medial thumb with 3x 2 cm oval wound. Some thumb nail removed. Wound with dried blood, early granulation, no drainage, no streaking, no erythema.   Neurological:     Mental Status: He is alert and oriented to person, place, and time.  Psychiatric:        Mood and Affect: Mood normal.        Behavior: Behavior normal.        Thought Content: Thought content normal.        Judgment: Judgment normal.       BP (!) 102/50 (BP Location: Left Arm, Patient Position: Sitting, Cuff Size: Normal)   Pulse 84   Temp 98.2 F (36.8 C) (Temporal)   Ht 6\' 2"  (1.88 m)   Wt 209  lb 6.4 oz (95 kg)   SpO2 95%   BMI 26.89 kg/m  Wt Readings from Last 3 Encounters:  12/06/19 209 lb 6.4 oz (95 kg)  06/24/19 201 lb (91.2 kg)  04/20/19 204 lb (92.5 kg)       Assessment & Plan:  1. Laceration of right thumb without foreign body with damage to nail, initial encounter - area cleaned and Xeroform, guaze applied - patient and his wife were instructed on daily care- rinse with sterile water, pat dry, apply xeroform until fully scabbed - RTC precautions reviewed - follow up in 1 week - Tdap vaccine greater than or equal to 7yo IM  This visit occurred during the SARS-CoV-2 public health emergency.  Safety protocols were in place, including screening questions prior to the visit, additional usage of staff PPE, and extensive cleaning of exam room while observing appropriate contact time as indicated for disinfecting solutions.    Clarene Reamer, FNP-BC  [facility]  12/06/2019 12:24 PM

## 2019-12-06 NOTE — Patient Instructions (Addendum)
Good to see you today. Please follow up in 1 week.   Daily, remove bandage and gently rinse with sterile water. Pat dry and apply Xeroform bandage to open area, then cover loosely with guaze.   Once wound is starting to develop a scab, can gently clean with mild soap and water, pat dry. Can leave open to air as able (cover at night, going out of house or doing anything where hand might get dirty)  If you develop any increased redness, streaking, swelling, pain or fevers/ body aches, please contact the office.

## 2019-12-11 DIAGNOSIS — J9600 Acute respiratory failure, unspecified whether with hypoxia or hypercapnia: Secondary | ICD-10-CM | POA: Diagnosis not present

## 2019-12-11 DIAGNOSIS — I509 Heart failure, unspecified: Secondary | ICD-10-CM | POA: Diagnosis not present

## 2019-12-11 DIAGNOSIS — J449 Chronic obstructive pulmonary disease, unspecified: Secondary | ICD-10-CM | POA: Diagnosis not present

## 2019-12-11 DIAGNOSIS — R062 Wheezing: Secondary | ICD-10-CM | POA: Diagnosis not present

## 2019-12-13 ENCOUNTER — Ambulatory Visit (INDEPENDENT_AMBULATORY_CARE_PROVIDER_SITE_OTHER): Payer: Medicare Other | Admitting: Family Medicine

## 2019-12-13 ENCOUNTER — Encounter: Payer: Self-pay | Admitting: Family Medicine

## 2019-12-13 ENCOUNTER — Other Ambulatory Visit: Payer: Self-pay

## 2019-12-13 VITALS — BP 126/60 | HR 87 | Temp 97.0°F | Ht 74.0 in | Wt 209.2 lb

## 2019-12-13 DIAGNOSIS — S61111D Laceration without foreign body of right thumb with damage to nail, subsequent encounter: Secondary | ICD-10-CM | POA: Diagnosis not present

## 2019-12-13 DIAGNOSIS — F419 Anxiety disorder, unspecified: Secondary | ICD-10-CM | POA: Diagnosis not present

## 2019-12-13 MED ORDER — ESCITALOPRAM OXALATE 5 MG PO TABS: 5.0000 mg | ORAL_TABLET | Freq: Every day | ORAL | 1 refills | Status: DC

## 2019-12-13 NOTE — Progress Notes (Signed)
This visit occurred during the SARS-CoV-2 public health emergency.  Safety protocols were in place, including screening questions prior to the visit, additional usage of staff PPE, and extensive cleaning of exam room while observing appropriate contact time as indicated for disinfecting solutions.  Finger injury.  Medial side of R thumb.  Was cutting potatoes when the injury happened.  Partial nail removal.  He has been keeping it clean and covered in the meantime.  Using petroleum dressing.  We talked about options at this point.   Sugar has been ~120 recently in the AM.  BP controlled, similar to today.   He had 1st covid shot on 11/26/19.  Moderna.  He will get follow-up vaccine at the [place].  Mood discussed with patient.  Anxiety noted by patient and wife.  He is "fed up" with Covid restrictions, chronic illness, etc.  No suicidal or homicidal intent.  We talked about options.  He was open to starting medication.  Rationale for treatment discussed.  Wife is supportive.  Meds, vitals, and allergies reviewed.   ROS: Per HPI unless specifically indicated in ROS section   nad Tearful discussing his mood but regains composure. Still pleasant in conversation. rrr Ctab Right hand with healing laceration noted on the medial right thumb with partial removal of the nail.  He has granulation tissue without spreading erythema or purulent discharge.  Appears to be healing normally.  Dressed with Neosporin, nonstick bandage, roll gauze.  Tolerated well.  He still has normal range of motion and normal capillary refill in the finger.

## 2019-12-13 NOTE — Patient Instructions (Signed)
Change dressing daily.  Nonstick pad with neosporin.  Gently wash with soapy water, rinse and blot dry.  Try taking lexapro.  It will take about 1 month to make a difference.  Please update me as needed.  Take care.  Glad to see you.

## 2019-12-15 DIAGNOSIS — S61111A Laceration without foreign body of right thumb with damage to nail, initial encounter: Secondary | ICD-10-CM | POA: Insufficient documentation

## 2019-12-15 DIAGNOSIS — F419 Anxiety disorder, unspecified: Secondary | ICD-10-CM | POA: Insufficient documentation

## 2019-12-15 NOTE — Assessment & Plan Note (Signed)
Appears to be healing.  Would likely be a slow process given the nature of the injury.  Discussed.  Dressed as above.  See after visit summary regarding daily wound treatment.  He will update me as needed.

## 2019-12-15 NOTE — Assessment & Plan Note (Addendum)
Mood discussed with patient.  Anxiety noted by patient and wife.  He is "fed up" with Covid restrictions, chronic illness, etc.  No suicidal or homicidal intent.  We talked about options.  He was open to starting medication.  Rationale for treatment discussed.  Wife is supportive.  Start Lexapro 5 mg a day with routine cautions and he will update me as needed.  Routine timeline for affect discussed with patient. At least 30 minutes were devoted to patient care in this encounter (this can potentially include time spent reviewing the patient's file/history, interviewing and examining the patient, counseling/reviewing plan with patient, ordering referrals, ordering tests, reviewing relevant laboratory or x-ray data, and documenting the encounter).

## 2019-12-24 DIAGNOSIS — R062 Wheezing: Secondary | ICD-10-CM | POA: Diagnosis not present

## 2019-12-24 DIAGNOSIS — J449 Chronic obstructive pulmonary disease, unspecified: Secondary | ICD-10-CM | POA: Diagnosis not present

## 2019-12-24 DIAGNOSIS — I509 Heart failure, unspecified: Secondary | ICD-10-CM | POA: Diagnosis not present

## 2019-12-24 DIAGNOSIS — J9600 Acute respiratory failure, unspecified whether with hypoxia or hypercapnia: Secondary | ICD-10-CM | POA: Diagnosis not present

## 2019-12-31 DIAGNOSIS — N184 Chronic kidney disease, stage 4 (severe): Secondary | ICD-10-CM | POA: Diagnosis not present

## 2020-01-06 DIAGNOSIS — D631 Anemia in chronic kidney disease: Secondary | ICD-10-CM | POA: Diagnosis not present

## 2020-01-06 DIAGNOSIS — N2581 Secondary hyperparathyroidism of renal origin: Secondary | ICD-10-CM | POA: Diagnosis not present

## 2020-01-06 DIAGNOSIS — I129 Hypertensive chronic kidney disease with stage 1 through stage 4 chronic kidney disease, or unspecified chronic kidney disease: Secondary | ICD-10-CM | POA: Diagnosis not present

## 2020-01-06 DIAGNOSIS — N1832 Chronic kidney disease, stage 3b: Secondary | ICD-10-CM | POA: Diagnosis not present

## 2020-01-09 ENCOUNTER — Other Ambulatory Visit: Payer: Self-pay | Admitting: Family Medicine

## 2020-01-09 DIAGNOSIS — I509 Heart failure, unspecified: Secondary | ICD-10-CM | POA: Diagnosis not present

## 2020-01-09 DIAGNOSIS — J9600 Acute respiratory failure, unspecified whether with hypoxia or hypercapnia: Secondary | ICD-10-CM | POA: Diagnosis not present

## 2020-01-09 DIAGNOSIS — Z794 Long term (current) use of insulin: Secondary | ICD-10-CM

## 2020-01-09 DIAGNOSIS — R062 Wheezing: Secondary | ICD-10-CM | POA: Diagnosis not present

## 2020-01-09 DIAGNOSIS — E1142 Type 2 diabetes mellitus with diabetic polyneuropathy: Secondary | ICD-10-CM

## 2020-01-09 DIAGNOSIS — J449 Chronic obstructive pulmonary disease, unspecified: Secondary | ICD-10-CM | POA: Diagnosis not present

## 2020-01-21 ENCOUNTER — Other Ambulatory Visit (INDEPENDENT_AMBULATORY_CARE_PROVIDER_SITE_OTHER): Payer: Medicare Other

## 2020-01-21 ENCOUNTER — Other Ambulatory Visit: Payer: Self-pay

## 2020-01-21 DIAGNOSIS — E1142 Type 2 diabetes mellitus with diabetic polyneuropathy: Secondary | ICD-10-CM | POA: Diagnosis not present

## 2020-01-21 DIAGNOSIS — J449 Chronic obstructive pulmonary disease, unspecified: Secondary | ICD-10-CM | POA: Diagnosis not present

## 2020-01-21 DIAGNOSIS — R062 Wheezing: Secondary | ICD-10-CM | POA: Diagnosis not present

## 2020-01-21 DIAGNOSIS — Z794 Long term (current) use of insulin: Secondary | ICD-10-CM | POA: Diagnosis not present

## 2020-01-21 DIAGNOSIS — I509 Heart failure, unspecified: Secondary | ICD-10-CM | POA: Diagnosis not present

## 2020-01-21 DIAGNOSIS — J9600 Acute respiratory failure, unspecified whether with hypoxia or hypercapnia: Secondary | ICD-10-CM | POA: Diagnosis not present

## 2020-01-21 LAB — BASIC METABOLIC PANEL
BUN: 42 mg/dL — ABNORMAL HIGH (ref 6–23)
CO2: 31 mEq/L (ref 19–32)
Calcium: 8.8 mg/dL (ref 8.4–10.5)
Chloride: 99 mEq/L (ref 96–112)
Creatinine, Ser: 2.23 mg/dL — ABNORMAL HIGH (ref 0.40–1.50)
GFR: 27.87 mL/min — ABNORMAL LOW (ref >60.00)
Glucose, Bld: 124 mg/dL — ABNORMAL HIGH (ref 70–99)
Potassium: 4.4 mEq/L (ref 3.5–5.1)
Sodium: 137 mEq/L (ref 135–145)

## 2020-01-21 LAB — HEMOGLOBIN A1C: Hgb A1c MFr Bld: 9.5 % — ABNORMAL HIGH (ref 4.6–6.5)

## 2020-01-24 DIAGNOSIS — E119 Type 2 diabetes mellitus without complications: Secondary | ICD-10-CM | POA: Diagnosis not present

## 2020-01-24 DIAGNOSIS — Z794 Long term (current) use of insulin: Secondary | ICD-10-CM | POA: Diagnosis not present

## 2020-01-25 ENCOUNTER — Ambulatory Visit: Payer: Medicare Other | Admitting: Family Medicine

## 2020-01-31 ENCOUNTER — Ambulatory Visit (INDEPENDENT_AMBULATORY_CARE_PROVIDER_SITE_OTHER): Payer: Medicare Other | Admitting: Family Medicine

## 2020-01-31 ENCOUNTER — Other Ambulatory Visit: Payer: Self-pay

## 2020-01-31 DIAGNOSIS — E1142 Type 2 diabetes mellitus with diabetic polyneuropathy: Secondary | ICD-10-CM

## 2020-01-31 DIAGNOSIS — J438 Other emphysema: Secondary | ICD-10-CM | POA: Diagnosis not present

## 2020-01-31 DIAGNOSIS — L408 Other psoriasis: Secondary | ICD-10-CM

## 2020-01-31 DIAGNOSIS — Z794 Long term (current) use of insulin: Secondary | ICD-10-CM

## 2020-01-31 DIAGNOSIS — N183 Chronic kidney disease, stage 3 unspecified: Secondary | ICD-10-CM | POA: Diagnosis not present

## 2020-01-31 DIAGNOSIS — F419 Anxiety disorder, unspecified: Secondary | ICD-10-CM

## 2020-01-31 MED ORDER — AMLODIPINE BESYLATE 2.5 MG PO TABS: 2.5000 mg | ORAL_TABLET | Freq: Every day | ORAL | 3 refills | Status: DC

## 2020-01-31 NOTE — Progress Notes (Signed)
Interactive audio and video telecommunications were attempted between this provider and patient, however failed, due to patient having technical difficulties OR patient did not have access to video capability.  We continued and completed visit with audio only.   Virtual Visit via Telephone Note  I connected with patient on 01/31/20  at 11:58 AM  by telephone and verified that I am speaking with the correct person using two identifiers.  Location of patient: home   Location of MD: [place] Name of referring provider (if blank then none associated): Names per persons and role in encounter:  MD: Earlyne Iba, Patient: name listed above.    I discussed the limitations, risks, security and privacy concerns of performing an evaluation and management service by telephone and the availability of in person appointments. I also discussed with the patient that there may be a patient responsible charge related to this service. The patient expressed understanding and agreed to proceed.  CC: DM/mood f/u.    History of Present Illness:   He has h/o scalp>trunk psoriasis.  Still on apremilast at baseline.    Diabetes:  Using medications without difficulties: yes Hypoglycemic episodes: see below.   Hyperglycemic episodes: occ elevations above 200.   Feet problems: he has pain at baseline, taking tylenol w/o much relief.   Blood Sugars averaging: variable, below.   eye exam within last year: due, d/w pt.   15 units insulin with glipizide 5mg  a day, with breakfast.   A1c 9.5.  Down to 10 this AM.  Cautions d/w pt.  He has been compliant with insulin.    His finger is healing well in the meantime.    CKD with Cr 2.2 on recent check.  D/w pt.  He has routine renal f/u.    He didn't like the effect of lexapro at 5mg  a day but it was a short trial.  He could try 1/2 tab a day.    His breathing is better back on spiriva, d/w pt and wife.  Pulse ox ~94-95% now.  Less SOB now with med use.     He and his wife have both have had covid vaccine.     Observations/Objective: nad Speech wnl  Assessment and Plan:  He has h/o scalp>trunk psoriasis.  Still on apremilast at baseline.  Would continue as is.  Diabetes:  I am checking on options for neuropathy cream for his foot pain.  Discussed. He is taking 15 units insulin with glipizide 5mg  a day, with breakfast.   A1c 9.5.   Sugar down to 69 this AM.  Cautions d/w pt.  He has been compliant with insulin.   I do not want to escalate his medications and induce hypoglycemia.  He agrees.  We agreed to work on his foot pain first and go from there.  CKD with Cr 2.2 on recent check.  D/w pt.  He has routine renal f/u.    He didn't like the effect of lexapro at 5mg  a day but it was a short trial.  He could try 1/2 tab a day, discussed with patient.  He will try that and then update me.  His breathing is better back on spiriva, d/w pt and wife.  Pulse ox ~94-95% now.  Less SOB now with med use.  Would continue as is.  He and his wife have both have had covid vaccine.    Follow Up Instructions: We will see about getting neuropathy cream compounded and then go from there.  We can set follow-up later on.  I discussed the assessment and treatment plan with the patient. The patient was provided an opportunity to ask questions and all were answered. The patient agreed with the plan and demonstrated an understanding of the instructions.   The patient was advised to call back or seek an in-person evaluation if the symptoms worsen or if the condition fails to improve as anticipated.  I provided 22 minutes of non-face-to-face time during this encounter.  [name], MD

## 2020-02-02 ENCOUNTER — Telehealth: Payer: Self-pay | Admitting: Family Medicine

## 2020-02-02 NOTE — Assessment & Plan Note (Signed)
He didn't like the effect of lexapro at 5mg  a day but it was a short trial.  He could try 1/2 tab a day, discussed with patient.  He will try that and then update me.

## 2020-02-02 NOTE — Assessment & Plan Note (Signed)
Continue apremilast.

## 2020-02-02 NOTE — Telephone Encounter (Signed)
Please update patient/wife.  I called in the prescription for neuropathy cream to use at [facility].  They compound it there.  That pharmacy should be contacting them about options/pricing.  It does contain gabapentin and the patient was not able to tolerate that by mouth but he should be able to use it as a cream without having systemic side effects.  I think it is okay to try and I do not expect it to cause a problem with his other medications.  I called in a 60 g tube with 1 refill for him to use 3 times a day as needed.  Have him let me know how that does for his foot pain.  I think it makes sense to set follow-up for 3 months with him to update me in the meantime if needed.  Thanks.

## 2020-02-02 NOTE — Assessment & Plan Note (Signed)
CKD with Cr 2.2 on recent check.  D/w pt.  He has routine renal f/u.

## 2020-02-02 NOTE — Assessment & Plan Note (Signed)
His breathing is better back on spiriva, d/w pt and wife.  Pulse ox ~94-95% now.  Less SOB now with med use.  Would continue as is.  He and his wife have both have had covid vaccine.

## 2020-02-02 NOTE — Telephone Encounter (Signed)
Patient's wife notified as instructed by telephone and verbalized understanding. Mrs.Osley was given the telephone number for [facility] in case she does not hear from them Follow-up appointment scheduled for 05/02/20 as instructed.

## 2020-02-02 NOTE — Assessment & Plan Note (Signed)
I am checking on options for neuropathy cream for his foot pain.  Discussed. He is taking 15 units insulin with glipizide 5mg  a day, with breakfast.   A1c 9.5.   Sugar down to 69 this AM.  Cautions d/w pt.  He has been compliant with insulin.   I do not want to escalate his medications and induce hypoglycemia.  He agrees.  We agreed to work on his foot pain first and go from there.

## 2020-02-08 DIAGNOSIS — R062 Wheezing: Secondary | ICD-10-CM | POA: Diagnosis not present

## 2020-02-08 DIAGNOSIS — J9600 Acute respiratory failure, unspecified whether with hypoxia or hypercapnia: Secondary | ICD-10-CM | POA: Diagnosis not present

## 2020-02-08 DIAGNOSIS — I509 Heart failure, unspecified: Secondary | ICD-10-CM | POA: Diagnosis not present

## 2020-02-08 DIAGNOSIS — J449 Chronic obstructive pulmonary disease, unspecified: Secondary | ICD-10-CM | POA: Diagnosis not present

## 2020-02-21 DIAGNOSIS — I509 Heart failure, unspecified: Secondary | ICD-10-CM | POA: Diagnosis not present

## 2020-02-21 DIAGNOSIS — J449 Chronic obstructive pulmonary disease, unspecified: Secondary | ICD-10-CM | POA: Diagnosis not present

## 2020-02-21 DIAGNOSIS — R062 Wheezing: Secondary | ICD-10-CM | POA: Diagnosis not present

## 2020-02-21 DIAGNOSIS — J9600 Acute respiratory failure, unspecified whether with hypoxia or hypercapnia: Secondary | ICD-10-CM | POA: Diagnosis not present

## 2020-03-10 DIAGNOSIS — R062 Wheezing: Secondary | ICD-10-CM | POA: Diagnosis not present

## 2020-03-10 DIAGNOSIS — I509 Heart failure, unspecified: Secondary | ICD-10-CM | POA: Diagnosis not present

## 2020-03-10 DIAGNOSIS — J449 Chronic obstructive pulmonary disease, unspecified: Secondary | ICD-10-CM | POA: Diagnosis not present

## 2020-03-10 DIAGNOSIS — J9600 Acute respiratory failure, unspecified whether with hypoxia or hypercapnia: Secondary | ICD-10-CM | POA: Diagnosis not present

## 2020-03-22 DIAGNOSIS — J449 Chronic obstructive pulmonary disease, unspecified: Secondary | ICD-10-CM | POA: Diagnosis not present

## 2020-03-22 DIAGNOSIS — I509 Heart failure, unspecified: Secondary | ICD-10-CM | POA: Diagnosis not present

## 2020-03-22 DIAGNOSIS — J9600 Acute respiratory failure, unspecified whether with hypoxia or hypercapnia: Secondary | ICD-10-CM | POA: Diagnosis not present

## 2020-03-22 DIAGNOSIS — R062 Wheezing: Secondary | ICD-10-CM | POA: Diagnosis not present

## 2020-04-09 DIAGNOSIS — J9600 Acute respiratory failure, unspecified whether with hypoxia or hypercapnia: Secondary | ICD-10-CM | POA: Diagnosis not present

## 2020-04-09 DIAGNOSIS — J449 Chronic obstructive pulmonary disease, unspecified: Secondary | ICD-10-CM | POA: Diagnosis not present

## 2020-04-09 DIAGNOSIS — I509 Heart failure, unspecified: Secondary | ICD-10-CM | POA: Diagnosis not present

## 2020-04-09 DIAGNOSIS — R062 Wheezing: Secondary | ICD-10-CM | POA: Diagnosis not present

## 2020-04-18 ENCOUNTER — Emergency Department (HOSPITAL_COMMUNITY): Payer: Medicare Other

## 2020-04-18 ENCOUNTER — Inpatient Hospital Stay (HOSPITAL_COMMUNITY)
Admission: EM | Admit: 2020-04-18 | Discharge: 2020-04-26 | DRG: 481 | Disposition: A | Discharge status: Rehabilitation Facility | Payer: Medicare Other | Attending: Internal Medicine | Admitting: Internal Medicine

## 2020-04-18 ENCOUNTER — Encounter (HOSPITAL_COMMUNITY): Payer: Self-pay

## 2020-04-18 ENCOUNTER — Other Ambulatory Visit: Payer: Self-pay

## 2020-04-18 DIAGNOSIS — M1990 Unspecified osteoarthritis, unspecified site: Secondary | ICD-10-CM | POA: Diagnosis not present

## 2020-04-18 DIAGNOSIS — T148XXA Other injury of unspecified body region, initial encounter: Secondary | ICD-10-CM

## 2020-04-18 DIAGNOSIS — N179 Acute kidney failure, unspecified: Secondary | ICD-10-CM | POA: Diagnosis present

## 2020-04-18 DIAGNOSIS — R310 Gross hematuria: Secondary | ICD-10-CM | POA: Diagnosis not present

## 2020-04-18 DIAGNOSIS — N400 Enlarged prostate without lower urinary tract symptoms: Secondary | ICD-10-CM | POA: Diagnosis present

## 2020-04-18 DIAGNOSIS — Z833 Family history of diabetes mellitus: Secondary | ICD-10-CM

## 2020-04-18 DIAGNOSIS — Z87891 Personal history of nicotine dependence: Secondary | ICD-10-CM

## 2020-04-18 DIAGNOSIS — N184 Chronic kidney disease, stage 4 (severe): Secondary | ICD-10-CM | POA: Diagnosis not present

## 2020-04-18 DIAGNOSIS — N1832 Chronic kidney disease, stage 3b: Secondary | ICD-10-CM | POA: Diagnosis present

## 2020-04-18 DIAGNOSIS — E1122 Type 2 diabetes mellitus with diabetic chronic kidney disease: Secondary | ICD-10-CM | POA: Diagnosis present

## 2020-04-18 DIAGNOSIS — G4700 Insomnia, unspecified: Secondary | ICD-10-CM | POA: Diagnosis present

## 2020-04-18 DIAGNOSIS — G8929 Other chronic pain: Secondary | ICD-10-CM | POA: Diagnosis present

## 2020-04-18 DIAGNOSIS — Z801 Family history of malignant neoplasm of trachea, bronchus and lung: Secondary | ICD-10-CM | POA: Diagnosis not present

## 2020-04-18 DIAGNOSIS — M9702XA Periprosthetic fracture around internal prosthetic left hip joint, initial encounter: Secondary | ICD-10-CM | POA: Diagnosis present

## 2020-04-18 DIAGNOSIS — Z888 Allergy status to other drugs, medicaments and biological substances status: Secondary | ICD-10-CM | POA: Diagnosis not present

## 2020-04-18 DIAGNOSIS — Y92481 Parking lot as the place of occurrence of the external cause: Secondary | ICD-10-CM | POA: Diagnosis not present

## 2020-04-18 DIAGNOSIS — G8918 Other acute postprocedural pain: Secondary | ICD-10-CM

## 2020-04-18 DIAGNOSIS — W010XXA Fall on same level from slipping, tripping and stumbling without subsequent striking against object, initial encounter: Secondary | ICD-10-CM | POA: Diagnosis present

## 2020-04-18 DIAGNOSIS — S728X2D Other fracture of left femur, subsequent encounter for closed fracture with routine healing: Secondary | ICD-10-CM | POA: Diagnosis not present

## 2020-04-18 DIAGNOSIS — E1140 Type 2 diabetes mellitus with diabetic neuropathy, unspecified: Secondary | ICD-10-CM | POA: Diagnosis present

## 2020-04-18 DIAGNOSIS — I2510 Atherosclerotic heart disease of native coronary artery without angina pectoris: Secondary | ICD-10-CM | POA: Diagnosis not present

## 2020-04-18 DIAGNOSIS — Z20822 Contact with and (suspected) exposure to covid-19: Secondary | ICD-10-CM | POA: Diagnosis not present

## 2020-04-18 DIAGNOSIS — S299XXA Unspecified injury of thorax, initial encounter: Secondary | ICD-10-CM | POA: Diagnosis not present

## 2020-04-18 DIAGNOSIS — E1165 Type 2 diabetes mellitus with hyperglycemia: Secondary | ICD-10-CM | POA: Diagnosis not present

## 2020-04-18 DIAGNOSIS — Y92230 Patient room in hospital as the place of occurrence of the external cause: Secondary | ICD-10-CM | POA: Diagnosis not present

## 2020-04-18 DIAGNOSIS — J449 Chronic obstructive pulmonary disease, unspecified: Secondary | ICD-10-CM | POA: Diagnosis present

## 2020-04-18 DIAGNOSIS — Z886 Allergy status to analgesic agent status: Secondary | ICD-10-CM

## 2020-04-18 DIAGNOSIS — S72002A Fracture of unspecified part of neck of left femur, initial encounter for closed fracture: Secondary | ICD-10-CM | POA: Diagnosis not present

## 2020-04-18 DIAGNOSIS — Z419 Encounter for procedure for purposes other than remedying health state, unspecified: Secondary | ICD-10-CM

## 2020-04-18 DIAGNOSIS — E875 Hyperkalemia: Secondary | ICD-10-CM | POA: Diagnosis not present

## 2020-04-18 DIAGNOSIS — Z96642 Presence of left artificial hip joint: Secondary | ICD-10-CM | POA: Diagnosis not present

## 2020-04-18 DIAGNOSIS — E119 Type 2 diabetes mellitus without complications: Secondary | ICD-10-CM | POA: Diagnosis not present

## 2020-04-18 DIAGNOSIS — Z9841 Cataract extraction status, right eye: Secondary | ICD-10-CM

## 2020-04-18 DIAGNOSIS — I10 Essential (primary) hypertension: Secondary | ICD-10-CM

## 2020-04-18 DIAGNOSIS — Z89422 Acquired absence of other left toe(s): Secondary | ICD-10-CM

## 2020-04-18 DIAGNOSIS — Z9852 Vasectomy status: Secondary | ICD-10-CM

## 2020-04-18 DIAGNOSIS — E1142 Type 2 diabetes mellitus with diabetic polyneuropathy: Secondary | ICD-10-CM | POA: Diagnosis not present

## 2020-04-18 DIAGNOSIS — E861 Hypovolemia: Secondary | ICD-10-CM | POA: Diagnosis not present

## 2020-04-18 DIAGNOSIS — S72112A Displaced fracture of greater trochanter of left femur, initial encounter for closed fracture: Secondary | ICD-10-CM | POA: Diagnosis not present

## 2020-04-18 DIAGNOSIS — Y848 Other medical procedures as the cause of abnormal reaction of the patient, or of later complication, without mention of misadventure at the time of the procedure: Secondary | ICD-10-CM | POA: Diagnosis not present

## 2020-04-18 DIAGNOSIS — F4320 Adjustment disorder, unspecified: Secondary | ICD-10-CM | POA: Diagnosis not present

## 2020-04-18 DIAGNOSIS — E1151 Type 2 diabetes mellitus with diabetic peripheral angiopathy without gangrene: Secondary | ICD-10-CM | POA: Diagnosis not present

## 2020-04-18 DIAGNOSIS — Z7902 Long term (current) use of antithrombotics/antiplatelets: Secondary | ICD-10-CM

## 2020-04-18 DIAGNOSIS — S7222XA Displaced subtrochanteric fracture of left femur, initial encounter for closed fracture: Secondary | ICD-10-CM | POA: Diagnosis not present

## 2020-04-18 DIAGNOSIS — H9190 Unspecified hearing loss, unspecified ear: Secondary | ICD-10-CM | POA: Diagnosis present

## 2020-04-18 DIAGNOSIS — W19XXXA Unspecified fall, initial encounter: Secondary | ICD-10-CM | POA: Diagnosis not present

## 2020-04-18 DIAGNOSIS — Z8249 Family history of ischemic heart disease and other diseases of the circulatory system: Secondary | ICD-10-CM | POA: Diagnosis not present

## 2020-04-18 DIAGNOSIS — R339 Retention of urine, unspecified: Secondary | ICD-10-CM | POA: Diagnosis not present

## 2020-04-18 DIAGNOSIS — Z96649 Presence of unspecified artificial hip joint: Secondary | ICD-10-CM | POA: Diagnosis not present

## 2020-04-18 DIAGNOSIS — I130 Hypertensive heart and chronic kidney disease with heart failure and stage 1 through stage 4 chronic kidney disease, or unspecified chronic kidney disease: Secondary | ICD-10-CM | POA: Diagnosis not present

## 2020-04-18 DIAGNOSIS — Z79899 Other long term (current) drug therapy: Secondary | ICD-10-CM

## 2020-04-18 DIAGNOSIS — N390 Urinary tract infection, site not specified: Secondary | ICD-10-CM | POA: Diagnosis not present

## 2020-04-18 DIAGNOSIS — Z9981 Dependence on supplemental oxygen: Secondary | ICD-10-CM | POA: Diagnosis not present

## 2020-04-18 DIAGNOSIS — M9702XD Periprosthetic fracture around internal prosthetic left hip joint, subsequent encounter: Secondary | ICD-10-CM | POA: Diagnosis not present

## 2020-04-18 DIAGNOSIS — M978XXS Periprosthetic fracture around other internal prosthetic joint, sequela: Secondary | ICD-10-CM | POA: Diagnosis not present

## 2020-04-18 DIAGNOSIS — R490 Dysphonia: Secondary | ICD-10-CM | POA: Diagnosis not present

## 2020-04-18 DIAGNOSIS — Z743 Need for continuous supervision: Secondary | ICD-10-CM | POA: Diagnosis not present

## 2020-04-18 DIAGNOSIS — J9600 Acute respiratory failure, unspecified whether with hypoxia or hypercapnia: Secondary | ICD-10-CM | POA: Diagnosis not present

## 2020-04-18 DIAGNOSIS — Z9049 Acquired absence of other specified parts of digestive tract: Secondary | ICD-10-CM

## 2020-04-18 DIAGNOSIS — Z03818 Encounter for observation for suspected exposure to other biological agents ruled out: Secondary | ICD-10-CM | POA: Diagnosis not present

## 2020-04-18 DIAGNOSIS — Z794 Long term (current) use of insulin: Secondary | ICD-10-CM | POA: Diagnosis not present

## 2020-04-18 DIAGNOSIS — I4891 Unspecified atrial fibrillation: Secondary | ICD-10-CM | POA: Diagnosis not present

## 2020-04-18 DIAGNOSIS — Z8551 Personal history of malignant neoplasm of bladder: Secondary | ICD-10-CM

## 2020-04-18 DIAGNOSIS — Z9842 Cataract extraction status, left eye: Secondary | ICD-10-CM

## 2020-04-18 DIAGNOSIS — T8383XA Hemorrhage of genitourinary prosthetic devices, implants and grafts, initial encounter: Secondary | ICD-10-CM | POA: Diagnosis not present

## 2020-04-18 DIAGNOSIS — Z7951 Long term (current) use of inhaled steroids: Secondary | ICD-10-CM

## 2020-04-18 DIAGNOSIS — Z882 Allergy status to sulfonamides status: Secondary | ICD-10-CM

## 2020-04-18 DIAGNOSIS — N3289 Other specified disorders of bladder: Secondary | ICD-10-CM | POA: Diagnosis not present

## 2020-04-18 DIAGNOSIS — Z961 Presence of intraocular lens: Secondary | ICD-10-CM | POA: Diagnosis not present

## 2020-04-18 DIAGNOSIS — I509 Heart failure, unspecified: Secondary | ICD-10-CM | POA: Diagnosis not present

## 2020-04-18 DIAGNOSIS — R319 Hematuria, unspecified: Secondary | ICD-10-CM | POA: Diagnosis not present

## 2020-04-18 DIAGNOSIS — M25552 Pain in left hip: Secondary | ICD-10-CM | POA: Diagnosis not present

## 2020-04-18 DIAGNOSIS — I5032 Chronic diastolic (congestive) heart failure: Secondary | ICD-10-CM | POA: Diagnosis not present

## 2020-04-18 DIAGNOSIS — D62 Acute posthemorrhagic anemia: Secondary | ICD-10-CM | POA: Diagnosis not present

## 2020-04-18 DIAGNOSIS — M978XXD Periprosthetic fracture around other internal prosthetic joint, subsequent encounter: Secondary | ICD-10-CM | POA: Diagnosis not present

## 2020-04-18 DIAGNOSIS — E1169 Type 2 diabetes mellitus with other specified complication: Secondary | ICD-10-CM

## 2020-04-18 DIAGNOSIS — R062 Wheezing: Secondary | ICD-10-CM | POA: Diagnosis not present

## 2020-04-18 DIAGNOSIS — E785 Hyperlipidemia, unspecified: Secondary | ICD-10-CM | POA: Diagnosis present

## 2020-04-18 DIAGNOSIS — Z974 Presence of external hearing-aid: Secondary | ICD-10-CM

## 2020-04-18 DIAGNOSIS — S0990XA Unspecified injury of head, initial encounter: Secondary | ICD-10-CM | POA: Diagnosis not present

## 2020-04-18 DIAGNOSIS — N183 Chronic kidney disease, stage 3 unspecified: Secondary | ICD-10-CM | POA: Diagnosis not present

## 2020-04-18 DIAGNOSIS — Z7901 Long term (current) use of anticoagulants: Secondary | ICD-10-CM | POA: Diagnosis not present

## 2020-04-18 DIAGNOSIS — R52 Pain, unspecified: Secondary | ICD-10-CM | POA: Diagnosis not present

## 2020-04-18 LAB — CBC WITH DIFFERENTIAL/PLATELET
Abs Immature Granulocytes: 0.11 K/uL — ABNORMAL HIGH (ref 0.00–0.07)
Basophils Absolute: 0.1 K/uL (ref 0.0–0.1)
Basophils Relative: 1 %
Eosinophils Absolute: 0.2 K/uL (ref 0.0–0.5)
Eosinophils Relative: 2 %
HCT: 43 % (ref 39.0–52.0)
Hemoglobin: 13.9 g/dL (ref 13.0–17.0)
Immature Granulocytes: 1 %
Lymphocytes Relative: 16 %
Lymphs Abs: 1.3 K/uL (ref 0.7–4.0)
MCH: 32.8 pg (ref 26.0–34.0)
MCHC: 32.3 g/dL (ref 30.0–36.0)
MCV: 101.4 fL — ABNORMAL HIGH (ref 80.0–100.0)
Monocytes Absolute: 0.7 K/uL (ref 0.1–1.0)
Monocytes Relative: 9 %
Neutro Abs: 5.6 K/uL (ref 1.7–7.7)
Neutrophils Relative %: 71 %
Platelets: 221 K/uL (ref 150–400)
RBC: 4.24 MIL/uL (ref 4.22–5.81)
RDW: 12.5 % (ref 11.5–15.5)
WBC: 8 K/uL (ref 4.0–10.5)
nRBC: 0 % (ref 0.0–0.2)

## 2020-04-18 LAB — RESP PANEL BY RT PCR (RSV, FLU A&B, COVID)
Influenza A by PCR: NEGATIVE
Influenza B by PCR: NEGATIVE
Respiratory Syncytial Virus by PCR: NEGATIVE
SARS Coronavirus 2 by RT PCR: NEGATIVE

## 2020-04-18 LAB — BASIC METABOLIC PANEL
Anion gap: 11 (ref 5–15)
BUN: 32 mg/dL — ABNORMAL HIGH (ref 8–23)
CO2: 29 mmol/L (ref 22–32)
Calcium: 8.4 mg/dL — ABNORMAL LOW (ref 8.9–10.3)
Chloride: 98 mmol/L (ref 98–111)
Creatinine, Ser: 1.93 mg/dL — ABNORMAL HIGH (ref 0.61–1.24)
GFR calc Af Amer: 35 mL/min — ABNORMAL LOW (ref >60)
GFR calc non Af Amer: 30 mL/min — ABNORMAL LOW (ref >60)
Glucose, Bld: 271 mg/dL — ABNORMAL HIGH (ref 70–99)
Potassium: 5.1 mmol/L (ref 3.5–5.1)
Sodium: 138 mmol/L (ref 135–145)

## 2020-04-18 LAB — GLUCOSE, CAPILLARY: Glucose-Capillary: 221 mg/dL — ABNORMAL HIGH (ref 70–99)

## 2020-04-18 MED ORDER — APREMILAST 30 MG PO TABS: 1.0000 | ORAL_TABLET | Freq: Every day | ORAL | Status: DC

## 2020-04-18 MED ORDER — HYDROCODONE-ACETAMINOPHEN 5-325 MG PO TABS
1.0000 | ORAL_TABLET | Freq: Four times a day (QID) | ORAL | Status: DC | PRN
  Administered 2020-04-18: 2 via ORAL
  Administered 2020-04-19: 1 via ORAL
  Administered 2020-04-19: 2 via ORAL
  Filled 2020-04-18 (×3): qty 2

## 2020-04-18 MED ORDER — TIOTROPIUM BROMIDE MONOHYDRATE 18 MCG IN CAPS: 18.0000 ug | ORAL_CAPSULE | Freq: Every day | RESPIRATORY_TRACT | Status: DC

## 2020-04-18 MED ORDER — HYDROMORPHONE HCL 1 MG/ML IJ SOLN
1.0000 mg | INTRAMUSCULAR | Status: DC | PRN
  Administered 2020-04-18: 1 mg via INTRAVENOUS
  Filled 2020-04-18 (×2): qty 1

## 2020-04-18 MED ORDER — SENNA 8.6 MG PO TABS
1.0000 | ORAL_TABLET | Freq: Two times a day (BID) | ORAL | Status: DC
  Administered 2020-04-18 – 2020-04-26 (×12): 8.6 mg via ORAL
  Filled 2020-04-18 (×14): qty 1

## 2020-04-18 MED ORDER — FENTANYL CITRATE (PF) 100 MCG/2ML IJ SOLN
100.0000 ug | Freq: Once | INTRAMUSCULAR | Status: AC
  Administered 2020-04-18: 100 ug via INTRAVENOUS
  Filled 2020-04-18: qty 2

## 2020-04-18 MED ORDER — FLUTICASONE FUROATE-VILANTEROL 200-25 MCG/INH IN AEPB
1.0000 | INHALATION_SPRAY | Freq: Every day | RESPIRATORY_TRACT | Status: DC
  Administered 2020-04-18 – 2020-04-26 (×8): 1 via RESPIRATORY_TRACT
  Filled 2020-04-18: qty 28

## 2020-04-18 MED ORDER — MORPHINE SULFATE (PF) 2 MG/ML IV SOLN
0.5000 mg | INTRAVENOUS | Status: DC | PRN
  Administered 2020-04-18: 0.5 mg via INTRAVENOUS
  Filled 2020-04-18: qty 1

## 2020-04-18 MED ORDER — ATORVASTATIN CALCIUM 10 MG PO TABS
10.0000 mg | ORAL_TABLET | Freq: Every day | ORAL | Status: DC
  Administered 2020-04-20 – 2020-04-26 (×6): 10 mg via ORAL
  Filled 2020-04-18 (×7): qty 1

## 2020-04-18 MED ORDER — HYDROMORPHONE HCL 1 MG/ML IJ SOLN
1.0000 mg | Freq: Once | INTRAMUSCULAR | Status: AC
  Administered 2020-04-18: 1 mg via INTRAVENOUS
  Filled 2020-04-18: qty 1

## 2020-04-18 MED ORDER — INSULIN ASPART 100 UNIT/ML ~~LOC~~ SOLN
0.0000 [IU] | Freq: Every day | SUBCUTANEOUS | Status: DC
  Administered 2020-04-18: 2 [IU] via SUBCUTANEOUS
  Administered 2020-04-20: 3 [IU] via SUBCUTANEOUS
  Administered 2020-04-21: 2 [IU] via SUBCUTANEOUS
  Administered 2020-04-22: 3 [IU] via SUBCUTANEOUS
  Administered 2020-04-23: 4 [IU] via SUBCUTANEOUS
  Administered 2020-04-24: 3 [IU] via SUBCUTANEOUS
  Administered 2020-04-25: 2 [IU] via SUBCUTANEOUS

## 2020-04-18 MED ORDER — UMECLIDINIUM BROMIDE 62.5 MCG/INH IN AEPB
1.0000 | INHALATION_SPRAY | Freq: Every day | RESPIRATORY_TRACT | Status: DC
  Administered 2020-04-18 – 2020-04-26 (×7): 1 via RESPIRATORY_TRACT
  Filled 2020-04-18: qty 7

## 2020-04-18 MED ORDER — HEPARIN SODIUM (PORCINE) 5000 UNIT/ML IJ SOLN
5000.0000 [IU] | Freq: Three times a day (TID) | INTRAMUSCULAR | Status: DC
  Administered 2020-04-18 – 2020-04-19 (×3): 5000 [IU] via SUBCUTANEOUS
  Filled 2020-04-18 (×3): qty 1

## 2020-04-18 MED ORDER — INSULIN ASPART 100 UNIT/ML ~~LOC~~ SOLN
0.0000 [IU] | Freq: Three times a day (TID) | SUBCUTANEOUS | Status: DC
  Administered 2020-04-19: 2 [IU] via SUBCUTANEOUS
  Administered 2020-04-19: 3 [IU] via SUBCUTANEOUS
  Administered 2020-04-20: 5 [IU] via SUBCUTANEOUS
  Administered 2020-04-20: 3 [IU] via SUBCUTANEOUS
  Administered 2020-04-20: 5 [IU] via SUBCUTANEOUS
  Administered 2020-04-21: 3 [IU] via SUBCUTANEOUS
  Administered 2020-04-21 (×2): 5 [IU] via SUBCUTANEOUS
  Administered 2020-04-22: 3 [IU] via SUBCUTANEOUS
  Administered 2020-04-22: 2 [IU] via SUBCUTANEOUS
  Administered 2020-04-22: 3 [IU] via SUBCUTANEOUS
  Administered 2020-04-23: 5 [IU] via SUBCUTANEOUS
  Administered 2020-04-23 – 2020-04-25 (×6): 3 [IU] via SUBCUTANEOUS
  Administered 2020-04-25 (×2): 2 [IU] via SUBCUTANEOUS
  Administered 2020-04-26: 1 [IU] via SUBCUTANEOUS

## 2020-04-18 MED ORDER — POLYETHYLENE GLYCOL 3350 17 G PO PACK
17.0000 g | PACK | Freq: Every day | ORAL | Status: DC | PRN
  Administered 2020-04-20: 17 g via ORAL
  Filled 2020-04-18: qty 1

## 2020-04-18 MED ORDER — ESCITALOPRAM OXALATE 10 MG PO TABS
5.0000 mg | ORAL_TABLET | Freq: Every day | ORAL | Status: DC
  Administered 2020-04-20 – 2020-04-26 (×6): 5 mg via ORAL
  Filled 2020-04-18 (×7): qty 1

## 2020-04-18 MED ORDER — AMLODIPINE BESYLATE 2.5 MG PO TABS
2.5000 mg | ORAL_TABLET | Freq: Every day | ORAL | Status: DC
  Administered 2020-04-21 – 2020-04-26 (×2): 2.5 mg via ORAL
  Filled 2020-04-18 (×5): qty 1

## 2020-04-18 MED ORDER — CLOBETASOL PROPIONATE 0.05 % EX SOLN: 1.0000 "application " | Freq: Every day | CUTANEOUS | Status: DC | PRN

## 2020-04-18 NOTE — ED Provider Notes (Signed)
[name] Small   CSN: [id number] Arrival date & time: 04/18/20  1051     History Chief Complaint  Patient presents with  . Hip Pain    Left injury    [name] Small is a 86 y.o. male.  86 yo M with a chief complaints of left hip pain after fall.  Patient was walking in lost his footing on the concrete and fell onto his left hip.  He denies other injury denies head injury loss consciousness neck pain back pain chest pain abdominal pain denies other extremity pain.  Had the left hip repaired about 4 years ago.  Is unsure who did it but thinks it was done locally.  The history is provided by the patient.  Injury This is a new problem. The current episode started less than 1 hour ago. The problem occurs constantly. The problem has not changed since onset.Pertinent negatives include no chest pain, no abdominal pain, no headaches and no shortness of breath. The symptoms are aggravated by bending, twisting and walking. Nothing relieves the symptoms. He has tried nothing for the symptoms. The treatment provided no relief.       Past Medical History:  Diagnosis Date  . (HFpEF) heart failure with preserved ejection fraction ([place])   . Arthritis   . Back pain    s/p lumbar injection 2014  . BENIGN PROSTATIC HYPERTROPHY 05/21/2007  . Bladder cancer ([place]) 10/2011  . CAD (coronary artery disease)    nonobstructive by cath 6/12:  mid LAD 30%, proximal obtuse marginal-2 30%, proximal RCA 20%, mid RCA 30-40%.  He had normal cardiac output and mildly elevated filling pressures but no significant pulmonary hypertension;   Echocardiogram in May 2012 demonstrated EF 50-55% and left atrial enlargement   . Cellulitis of left foot    Hospitalized in 2006  . Chronic kidney disease (CKD), stage III (moderate) 12/04/2007   FOLLOWED BY DR PATEL  . Chronic pain of lower extremity   . Complication of anesthesia    1 time bladder cancer surgery- 2012 , oxygen  satruratioon dropped had to stay overnight, no problems since then.  Marland Kitchen COPD (chronic obstructive pulmonary disease) ([place])   . DIABETES MELLITUS, TYPE II 05/21/2007  . DIVERTICULOSIS, COLON 05/21/2007   pt denies  . Dyspnea    with exertion, patient mointors saturation  . Fatigue AGE-RELATED  . Gross hematuria    pt denies  . HYPERTENSION 05/21/2007  . Impaired hearing BILATERAL HEARING AIDS  . On home oxygen therapy    "2L at nighttime" (02/28/2016)  . PAD (peripheral artery disease) ([place])    a. 02/2016 L foot nonhealing ulcer-->Periph angio: L pop 100 w/ reconstitution via extensive vollats to the prox-mid peroneal (only patent vessel BK)-->PTA of L Pop and peroneal; b. Ongoing LE ischemia 5 & 06/2016 req amputation of toes on L foot.  . Pneumonia    Hx  of   . Psoriasis ELBOWS  . Psoriasis   . PSORIASIS, SCALP 10/25/2008    Patient Active Problem List   Diagnosis Date Noted  . Closed left hip fracture, initial encounter ([place]) 04/18/2020  . Anxiety 12/15/2019  . Laceration of right thumb without foreign body with damage to nail 12/15/2019  . Anemia 01/31/2017  . Hypoxia   . History of hip replacement 12/26/2016    Class: Chronic  . Status post lumbar laminectomy 12/26/2016    Class: Chronic  . S/p left hip fracture 12/26/2016  . Idiopathic chronic  venous hypertension of left lower extremity with inflammation 11/07/2016  . Spinal stenosis of lumbar region with neurogenic claudication 10/18/2016  . Spinal stenosis of lumbar region 09/25/2016  . Fall 07/04/2016  . COPD (chronic obstructive pulmonary disease) ([place]) 06/10/2016  . Weakness 03/18/2016  . Critical lower limb ischemia 02/28/2016  . Peripheral arterial disease ([place]) 10/17/2015  . Edema 07/19/2015  . Claudication ([place]) 01/31/2015  . DOE (dyspnea on exertion) 01/31/2015  . Hyperlipidemia 01/31/2015  . HTN (hypertension) 01/31/2015  . Chronic diastolic CHF (congestive heart failure) ([place]) 01/21/2014  . Atrial  fibrillation ([place]) 12/16/2012  . Hyperkalemia 11/27/2011  . Bladder cancer ([place]) 09/22/2011  . Leg cramps 08/28/2011  . Cough 05/26/2011  . CAD (coronary artery disease) of artery bypass graft 05/09/2011  . Dyspnea on exertion 04/19/2011  . PSORIASIS, SCALP 10/25/2008  . Chronic kidney disease, stage III (moderate) 12/04/2007  . Type 2 diabetes mellitus with diabetic polyneuropathy ([place]) 05/21/2007  . DIVERTICULOSIS, COLON 05/21/2007  . BPH (benign prostatic hyperplasia) 05/21/2007    Past Surgical History:  Procedure Laterality Date  . AMPUTATION Left 03/27/2016   Procedure: partial first AMPUTATION RAY; LEFT;  Surgeon: Trula Slade, DPM;  Location: [place];  Service: Podiatry;  Laterality: Left;  . AMPUTATION Left 06/12/2016   Procedure: TRANSMETATARSAL AMPUTATION LEFT FOOT;  Surgeon: Newt Minion, MD;  Location: [place];  Service: Orthopedics;  Laterality: Left;  . APPENDECTOMY  1941  . CARDIAC CATHETERIZATION  04-24-11/  DR Rogue Jury ARIDA   MILD NONOBSTRUCTIVE CAD, [place]MA CARDIAC OUTPUT  . CARDIOVASCULAR STRESS TEST  2007  . CATARACT EXTRACTION W/ INTRAOCULAR LENS  IMPLANT, BILATERAL Bilateral ~ 2010  . CYSTOSCOPY  12/25/2011   Procedure: CYSTOSCOPY;  Surgeon: Molli Hazard, MD;  Location: [facility];  Service: Urology;  Laterality: N/A;  needs intubation and to be paralyzed   . CYSTOSCOPY W/ RETROGRADES  10/30/2011   Procedure: CYSTOSCOPY WITH RETROGRADE PYELOGRAM;  Surgeon: Molli Hazard, MD;  Location: [facility];  Service: Urology;  Laterality: Bilateral;  CYSTOSCOPY POSS TURBT BILATERAL RETROGRADE PYLEOGRAM   . CYSTOSCOPY W/ RETROGRADES  11/30/2012   Procedure: CYSTOSCOPY WITH RETROGRADE PYELOGRAM;  Surgeon: Molli Hazard, MD;  Location: [facility];  Service: Urology;  Laterality: Bilateral;  Flexible cystoscopy.  . CYSTOSCOPY WITH BIOPSY  11/30/2012   Procedure: CYSTOSCOPY WITH BIOPSY;  Surgeon: Molli Hazard, MD;  Location: [facility];  Service: Urology;  Laterality: N/A;  . INCISION AND DRAINAGE FOOT Left ~ 2005   LEFT FOOT DUE TO INFECTION FROM  NAIL PUNCTURE INJURY  . LUMBAR LAMINECTOMY/DECOMPRESSION MICRODISCECTOMY N/A 10/18/2016   Procedure: LEFT AND CENTRAL L4-5 LUMBAR LAMINECTOMY WITH RESECTION OF SYNOVIAL CYST;  Surgeon: Jessy Oto, MD;  Location: [place];  Service: Orthopedics;  Laterality: N/A;  . PENILE PROSTHESIS IMPLANT  1990s  . PERIPHERAL VASCULAR CATHETERIZATION N/A 02/14/2016   Procedure: Abdominal Aortogram w/Lower Extremity;  Surgeon: Wellington Hampshire, MD;  Location: [place] [facility];  Service: Cardiovascular;  Laterality: N/A;  . PERIPHERAL VASCULAR CATHETERIZATION Left 02/28/2016   Procedure: Peripheral Vascular Balloon Angioplasty;  Surgeon: Wellington Hampshire, MD;  Location: [place] [facility];  Service: Cardiovascular;  Laterality: Left;  left peroneal and popliteal artery  . [place]  . TOTAL HIP ARTHROPLASTY Left 12/27/2016   Procedure: LEFT TOTAL HIP ARTHROPLASTY ANTERIOR APPROACH;  Surgeon: Mcarthur Rossetti, MD;  Location: [facility];  Service: Orthopedics;  Laterality: Left;  .  TRANSURETHRAL RESECTION OF BLADDER TUMOR  10/30/2011   Procedure: TRANSURETHRAL RESECTION OF BLADDER TUMOR (TURBT);  Surgeon: Molli Hazard, MD;  Location: [facility];  Service: Urology;  Laterality: N/A;  . TRANSURETHRAL RESECTION OF BLADDER TUMOR  12/25/2011   Procedure: TRANSURETHRAL RESECTION OF BLADDER TUMOR (TURBT);  Surgeon: Molli Hazard, MD;  Location: [facility];  Service: Urology;  Laterality: N/A;  need long gyrus instruments   . VASECTOMY  1990s       Family History  Problem Relation Age of Onset  . Diabetes Mother   . Heart disease Brother   . Heart disease Brother   . Heart disease Brother   . Heart disease Brother   . Heart disease Brother   . Heart disease Brother    . Lung cancer Brother     Social History   Tobacco Use  . Smoking status: Former Smoker    Packs/day: 2.00    Years: 22.00    Pack years: 44.00    Types: Cigarettes    Quit date: 11/11/1968    Years since quitting: 51.4  . Smokeless tobacco: Never Used  Substance Use Topics  . Alcohol use: No    Comment: occassional- 10/17/16- none  in 4 months- "too sick"  . Drug use: No    Home Medications Prior to Admission medications   Medication Sig Start Date End Date Taking? Authorizing Provider  acetaminophen (TYLENOL) 500 MG tablet Take 1,000 mg by mouth every 8 (eight) hours as needed for mild pain, moderate pain or headache.   Yes [provider]  amLODipine (NORVASC) 2.5 MG tablet Take 1 tablet (2.5 mg total) by mouth daily. 01/31/20  Yes Tonia Ghent, MD  Apremilast 30 MG TABS Take 1 tablet by mouth daily. 03/30/19  Yes Tonia Ghent, MD  atorvastatin (LIPITOR) 10 MG tablet TAKE 1 TABLET BY MOUTH  DAILY Patient taking differently: Take 10 mg by mouth daily.  07/12/19  Yes Tonia Ghent, MD  budesonide-formoterol [facility]) 160-4.5 MCG/ACT inhaler Inhale 2 puffs into the lungs 2 (two) times daily as needed (shortness of breath).  01/21/14  Yes Tonia Ghent, MD  clobetasol (TEMOVATE) 0.05 % external solution Apply 1 application topically daily as needed (rash).    Yes [provider]  clopidogrel (PLAVIX) 75 MG tablet TAKE 1 TABLET BY MOUTH  DAILY Patient taking differently: Take 75 mg by mouth daily.  06/18/19  Yes Tonia Ghent, MD  furosemide (LASIX) 40 MG tablet TAKE 1 TABLET BY MOUTH  EVERY MONDAY, [place],  AND FRIDAY Patient taking differently: Take 40 mg by mouth every Monday, Wednesday, and Friday.  06/11/19  Yes Tonia Ghent, MD  glipiZIDE (GLUCOTROL XL) 5 MG 24 hr tablet TAKE 1 TABLET BY MOUTH  DAILY WITH BREAKFAST Patient taking differently: Take 5 mg by mouth daily with breakfast.  06/18/19  Yes Tonia Ghent, MD  insulin glargine  (LANTUS) 100 UNIT/ML injection Inject 0.15 mLs (15 Units total) into the skin daily. Patient taking differently: Inject 15 Units into the skin at bedtime.  10/17/16  Yes Tonia Ghent, MD  nystatin (MYCOSTATIN/NYSTOP) powder Apply to affected area twice a day Patient taking differently: Apply 1 application topically 2 (two) times daily as needed (fungal infections).  05/11/19  Yes Tonia Ghent, MD  tiotropium (SPIRIVA) 18 MCG inhalation capsule Place 1 capsule (18 mcg total) into inhaler and inhale daily. 09/25/18  Yes Tonia Ghent, MD  escitalopram (  LEXAPRO) 5 MG tablet Take 1 tablet (5 mg total) by mouth daily. Patient not taking: Reported on 01/31/2020 12/13/19   Tonia Ghent, MD    Allergies    Actos [pioglitazone hydrochloride], Aspirin, Ace inhibitors, Bactrim, Gabapentin, Lyrica [pregabalin], Pravastatin, and Sulfa drugs cross reactors  Review of Systems   Review of Systems  Constitutional: Negative for chills and fever.  HENT: Negative for congestion and facial swelling.   Eyes: Negative for discharge and visual disturbance.  Respiratory: Negative for shortness of breath.   Cardiovascular: Negative for chest pain and palpitations.  Gastrointestinal: Negative for abdominal pain, diarrhea and vomiting.  Musculoskeletal: Positive for arthralgias and myalgias.  Skin: Negative for color change and rash.  Neurological: Negative for tremors, syncope and headaches.  Psychiatric/Behavioral: Negative for confusion and dysphoric mood.    Physical Exam Updated Vital Signs BP (!) 115/59   Pulse 75   Temp 98.4 F (36.9 C)   Resp 13   SpO2 95%   Physical Exam Vitals and nursing Small reviewed.  Constitutional:      Appearance: He is well-developed.  HENT:     Head: Normocephalic and atraumatic.  Eyes:     Pupils: Pupils are equal, round, and reactive to light.  Neck:     Vascular: No JVD.  Cardiovascular:     Rate and Rhythm: Normal rate and regular rhythm.     Heart  sounds: No murmur. No friction rub. No gallop.   Pulmonary:     Effort: No respiratory distress.     Breath sounds: No wheezing.  Abdominal:     General: There is no distension.     Tenderness: There is no guarding or rebound.  Musculoskeletal:        General: Tenderness present. Normal range of motion.     Cervical back: Normal range of motion and neck supple.     Comments: Tenderness to the left hip, palpable femoral head.  PMS intact distally.  Externally rotated and shortened  Skin:    Coloration: Skin is not pale.     Findings: No rash.  Neurological:     Mental Status: He is alert and oriented to person, place, and time.  Psychiatric:        Behavior: Behavior normal.     ED Results / Procedures / Treatments   Labs (all labs ordered are listed, but only abnormal results are displayed) Labs Reviewed  CBC WITH DIFFERENTIAL/PLATELET - Abnormal; Notable for the following components:      Result Value   MCV 101.4 (*)    Abs Immature Granulocytes 0.11 (*)    All other components within normal limits  BASIC METABOLIC PANEL - Abnormal; Notable for the following components:   Glucose, Bld 271 (*)    BUN 32 (*)    Creatinine, Ser 1.93 (*)    Calcium 8.4 (*)    GFR calc non Af Amer 30 (*)    GFR calc Af Amer 35 (*)    All other components within normal limits  RESP PANEL BY RT PCR (RSV, FLU A&B, COVID)    EKG None  Radiology DG Chest 1 View  Result Date: 04/18/2020 CLINICAL DATA:  Pain following fall EXAM: CHEST  1 VIEW COMPARISON:  September 25, 2018 FINDINGS: Lungs are clear. Heart size and pulmonary vascularity are normal. No adenopathy. There is aortic atherosclerosis. No pneumothorax. No fracture noted in the chest region. IMPRESSION: Lungs clear. Cardiac silhouette normal. Aortic Atherosclerosis (ICD10-I70.0). Electronically Signed   By:  Lowella Grip III M.D.   On: 04/18/2020 13:44   DG Pelvis Portable  Result Date: 04/18/2020 CLINICAL DATA:  Left hip pain after  fall EXAM: PORTABLE PELVIS 1-2 VIEWS COMPARISON:  12/27/2016 FINDINGS: There is an obliquely oriented periprosthetic fracture deformity involving the proximal left femur. Fracture line extends into the greater trochanter. The fracture fragments are in near anatomic alignment. Moderate to advanced right hip osteoarthritis. Penile prosthesis again noted IMPRESSION: Acute obliquely oriented periprosthetic fracture deformity involves the proximal left femur. Electronically Signed   By: Kerby Moors M.D.   On: 04/18/2020 11:37   DG Hip Unilat W or Wo Pelvis 2-3 Views Left  Result Date: 04/18/2020 CLINICAL DATA:  Pain following fall EXAM: DG HIP (WITH OR WITHOUT PELVIS) 2-3V LEFT COMPARISON:  Pelvis radiograph April 18, 2020 FINDINGS: Frontal and lateral views obtained. There is a total hip replacement on the left. There is a comminuted fracture of the left proximal femur. Specifically, there is an obliquely oriented periprostatic fracture medially which extends into the proximal femoral diaphysis. There is medial displacement of the distal aspect of this fracture. There is also avulsion along the lateral aspect of the greater trochanter. Appearance stable compared to earlier in the day. No dislocation. No erosive change. Penile prosthesis noted. IMPRESSION: Total hip replacement on the left with fractures along the greater tuberosity and medial proximal femoral diaphysis. The displacement of fracture fragments is stable compared to earlier in the day. The alignment of the total hip replacement is unchanged with prosthetic components remaining well seated. No dislocation. Electronically Signed   By: Lowella Grip III M.D.   On: 04/18/2020 13:43    Procedures Procedures (including critical care time)  Medications Ordered in ED Medications  HYDROcodone-acetaminophen (NORCO/VICODIN) 5-325 MG per tablet 1-2 tablet (has no administration in time range)  heparin injection 5,000 Units (has no administration in  time range)  senna (SENOKOT) tablet 8.6 mg (has no administration in time range)  polyethylene glycol (MIRALAX / GLYCOLAX) packet 17 g (has no administration in time range)  HYDROmorphone (DILAUDID) injection 1 mg (has no administration in time range)  fentaNYL (SUBLIMAZE) injection 100 mcg (100 mcg Intravenous Given 04/18/20 1125)  HYDROmorphone (DILAUDID) injection 1 mg (1 mg Intravenous Given 04/18/20 1610)    ED Course  I have reviewed the triage vital signs and the nursing notes.  Pertinent labs & imaging results that were available during my care of the patient were reviewed by me and considered in my medical decision making (see chart for details).    MDM Rules/Calculators/A&P                      85 yo M with a chief complaints of left hip pain.  This occurred after a fall.  Prior to his left hip repair.  Clinically he most likely is dislocated with left leg externally rotated and shortened.    Plain film viewed by me with a periprosthetic fracture.  With shortening and internal rotation I consulted to orthopedics.  Will obtain dedicated hip film.  As the patient is unable to bear weight or ambulate he will likely be admitted, will order basic lab work-up chest x-ray coronavirus test EKG.  Discussed with Ortho.  Would like the patient transferred to [facility].  Surgery not likely today possibly tomorrow.  The patients results and plan were reviewed and discussed.   Any x-rays performed were independently reviewed by myself.   Differential diagnosis were considered with the presenting  HPI.  Medications  HYDROcodone-acetaminophen (NORCO/VICODIN) 5-325 MG per tablet 1-2 tablet (has no administration in time range)  heparin injection 5,000 Units (has no administration in time range)  senna (SENOKOT) tablet 8.6 mg (has no administration in time range)  polyethylene glycol (MIRALAX / GLYCOLAX) packet 17 g (has no administration in time range)  HYDROmorphone (DILAUDID) injection 1 mg (has no  administration in time range)  fentaNYL (SUBLIMAZE) injection 100 mcg (100 mcg Intravenous Given 04/18/20 1125)  HYDROmorphone (DILAUDID) injection 1 mg (1 mg Intravenous Given 04/18/20 1610)    Vitals:   04/18/20 1430 04/18/20 1445 04/18/20 1500 04/18/20 1515  BP: 116/67 124/66 112/69 (!) 115/59  Pulse: 75 74 79 75  Resp: 15 14 17 13   Temp:      SpO2: 95% 94% 97% 95%    Final diagnoses:  Closed left subtrochanteric femur fracture, initial encounter ([place])    Admission/ observation were discussed with the admitting physician, patient and/or family and they are comfortable with the plan.    Final Clinical Impression(s) / ED Diagnoses Final diagnoses:  Closed left subtrochanteric femur fracture, initial encounter [facility])    Rx / DC Orders ED Discharge Orders    None       Deno Etienne, DO 04/18/20 1626

## 2020-04-18 NOTE — ED Notes (Signed)
Pt refused Xray until more pain medication.  Messaged MD to request.

## 2020-04-18 NOTE — ED Notes (Signed)
[facility] called for transport. All paperwork at nurses station.

## 2020-04-18 NOTE — Plan of Care (Signed)
  Problem: Education: Goal: Verbalization of understanding the information provided (i.e., activity precautions, restrictions, etc) will improve 04/18/2020 1833 by Madaline Brilliant, RN Outcome: Progressing 04/18/2020 1832 by Madaline Brilliant, RN Outcome: Progressing Goal: Individualized Educational Video(s) 04/18/2020 1833 by Madaline Brilliant, RN Outcome: Progressing 04/18/2020 1832 by Madaline Brilliant, RN Outcome: Progressing   Problem: Activity: Goal: Ability to ambulate and perform ADLs will improve 04/18/2020 1833 by Madaline Brilliant, RN Outcome: Progressing 04/18/2020 1832 by Madaline Brilliant, RN Outcome: Progressing   Problem: Clinical Measurements: Goal: Postoperative complications will be avoided or minimized 04/18/2020 1833 by Madaline Brilliant, RN Outcome: Progressing 04/18/2020 1832 by Madaline Brilliant, RN Outcome: Progressing   Problem: Self-Concept: Goal: Ability to maintain and perform role responsibilities to the fullest extent possible will improve 04/18/2020 1833 by Madaline Brilliant, RN Outcome: Progressing 04/18/2020 1832 by Madaline Brilliant, RN Outcome: Progressing   Problem: Pain Management: Goal: Pain level will decrease 04/18/2020 1833 by Madaline Brilliant, RN Outcome: Progressing 04/18/2020 1832 by Madaline Brilliant, RN Outcome: Progressing   Problem: Education: Goal: Knowledge of General Education information will improve Description: Including pain rating scale, medication(s)/side effects and non-pharmacologic comfort measures Outcome: Progressing   Problem: Clinical Measurements: Goal: Ability to maintain clinical measurements within normal limits will improve Outcome: Progressing Goal: Will remain free from infection Outcome: Progressing Goal: Diagnostic test results will improve Outcome: Progressing Goal: Respiratory complications will improve Outcome: Progressing Goal: Cardiovascular complication will be avoided Outcome: Progressing   Problem:  Activity: Goal: Risk for activity intolerance will decrease Outcome: Progressing   Problem: Nutrition: Goal: Adequate nutrition will be maintained Outcome: Progressing   Problem: Coping: Goal: Level of anxiety will decrease Outcome: Progressing   Problem: Elimination: Goal: Will not experience complications related to bowel motility Outcome: Progressing Goal: Will not experience complications related to urinary retention Outcome: Progressing   Problem: Pain Managment: Goal: General experience of comfort will improve Outcome: Progressing   Problem: Safety: Goal: Ability to remain free from injury will improve Outcome: Progressing   Problem: Skin Integrity: Goal: Risk for impaired skin integrity will decrease Outcome: Progressing

## 2020-04-18 NOTE — Progress Notes (Signed)
Orthopedic Tech Progress Note Patient Details:  [name] [birth year] [id number] Patient was tall. Installed bucks traction as best as possible.  Musculoskeletal Traction Type of Traction: Bucks Skin Traction Traction Location: LLE Traction Weight: 10 lbs   Post Interventions Patient Tolerated: Well Instructions Provided: Care of device   Lazar Tierce A Luane Rochon 04/18/2020, 10:39 PM

## 2020-04-18 NOTE — Plan of Care (Signed)

## 2020-04-18 NOTE — ED Triage Notes (Signed)
Pt arrived via [facility] from public location CC left hip pain r/t " falling off a curb. Per EMS malrotation, limited movement, a/oX4.     Hx left hip fracture, COPD, decreased hearing only has left hearing aid

## 2020-04-18 NOTE — ED Notes (Signed)
[facility] at bedside 

## 2020-04-18 NOTE — ED Notes (Signed)
Called [facility] to let them know pt received pain medication and is ready

## 2020-04-18 NOTE — Progress Notes (Signed)
86 year old male admitted to room 9 with diagnosis of left hip fracture after a fall.  Skin Assessment: numerous bruising noted over both arms with abrasions noted to bilateral elbows.  "old" scar noted to the left hip- family reported that he has had previous hip fracture. Left foot noted to have had all toes amputated from previous surgery.  No broken skin areas or skin problems noted to the back or coccyx area.

## 2020-04-18 NOTE — H&P (Signed)
History and Physical    NASIRE REALI ZYS:[id number] DOB: [birth year] DOA: 04/18/2020  PCP: Tonia Ghent, MD   Chief Complaint: Left hip pain after mechanical fall  HPI: [name] Small is a 86 y.o. male with medical history significant for nonobstructive CAD, hypertension, hyperlipidemia, diverticulosis, non-insulin-dependent diabetes type 2, COPD on room air, CKD 3B, heart failure with preserved ejection fraction(no recent echo on file) arthritis status post left femur fracture and ORIF 2018.  Patient presents from home with wife after mechanical fall stepping off a curb and missing his step he fell onto his left side with left hip and rib pain.  Patient denies any syncope presyncope headache fever chills nausea vomiting diarrhea constipation shortness of breath or chest pain prior to this event.  ED Course: In ED imaging remarkable for periprosthetic fracture, orthopedic consulted recommending transfer to [facility] for surgical repair.  Patient's labs otherwise show mild creatinine elevation in the setting of CKD 3B as well has hyperglycemia.  Patient's only complaint at this time is intractable left hip pain not well controlled on current regimen of morphine and hydrocodone.  Due to fracture need for surgical repair hospitalist was called for admission.  Review of Systems: As per HPI denies nausea, vomiting, syncope, presyncope, headache, fevers, chills.  Patient does report intractable left hip pain worse with range of motion or movement, improved with rest and narcotics.   Assessment/Plan Principal Problem:   Closed left hip fracture, initial encounter [facility]) Active Problems:   Atrial fibrillation (HCC)   Mechanical fall with notable acute obliquely oriented periprosthetic fracture deformity involves the proximal left femur. -Ortho continues to follow along, requesting patient be admitted to [facility] due to lack of orthopedic surgical beds at [facility] long -patient and wife at bedside  agreeable for transfer -Fracture appears to be periprosthetic, history of left femur fracture with ORIF 2018. -Pain initially poorly controlled on morphine and fentanyl, transition to Dilaudid 1 to 2 mg IV as needed every 2 hours -Defer to orthopedics for perioperative care -N.p.o. at midnight  CKD 3B at baseline -Continue to follow repeat labs Lab Results  Component Value Date   CREATININE 1.93 (H) 04/18/2020   CREATININE 2.23 (H) 01/21/2020   CREATININE 2.26 (H) 10/22/2019   Hypertension -Resume home medications including amlodipine, continue holding furosemide in the setting of poor p.o. intake perioperatively  Hyperlipidemia -Resume home atorvastatin  Non-insulin-dependent diabetes type 2 -Hold home p.o. medications and long-acting insulin perioperatively, continue sliding scale insulin  COPD, not in acute exacerbation on room air -Continue Breo, auto substitute for home Symbicort, continue home Spiriva -No indication for acute treatment, steroids, nebs  DVT prophylaxis: Subcu heparin Code Status: Full Family Communication: Wife at bedside Status is: Inpatient  Dispo: The patient is from: Home              Anticipated d/c is to: To be determined pending clinical status              Anticipated d/c date is: Likely 83 to 72 hours pending clinical course              Patient currently not medically stable for discharge due to need for surgical intervention of acute left femur fracture, need for IV narcotics for pain control and ongoing ambulatory dysfunction.  Consultants:   Orthopedic surgery  Procedures:   Tentative ORIF planned 04/19/2020   Past Medical History:  Diagnosis Date  . (HFpEF) heart failure with preserved ejection fraction ([place])   .  Arthritis   . Back pain    s/p lumbar injection 2014  . BENIGN PROSTATIC HYPERTROPHY 05/21/2007  . Bladder cancer ([place]) 10/2011  . CAD (coronary artery disease)    nonobstructive by cath 6/12:  mid LAD 30%, proximal  obtuse marginal-2 30%, proximal RCA 20%, mid RCA 30-40%.  He had normal cardiac output and mildly elevated filling pressures but no significant pulmonary hypertension;   Echocardiogram in May 2012 demonstrated EF 50-55% and left atrial enlargement   . Cellulitis of left foot    Hospitalized in 2006  . Chronic kidney disease (CKD), stage III (moderate) 12/04/2007   FOLLOWED BY DR PATEL  . Chronic pain of lower extremity   . Complication of anesthesia    1 time bladder cancer surgery- 2012 , oxygen satruratioon dropped had to stay overnight, no problems since then.  Marland Kitchen COPD (chronic obstructive pulmonary disease) ([place])   . DIABETES MELLITUS, TYPE II 05/21/2007  . DIVERTICULOSIS, COLON 05/21/2007   pt denies  . Dyspnea    with exertion, patient mointors saturation  . Fatigue AGE-RELATED  . Gross hematuria    pt denies  . HYPERTENSION 05/21/2007  . Impaired hearing BILATERAL HEARING AIDS  . On home oxygen therapy    "2L at nighttime" (02/28/2016)  . PAD (peripheral artery disease) ([place])    a. 02/2016 L foot nonhealing ulcer-->Periph angio: L pop 100 w/ reconstitution via extensive vollats to the prox-mid peroneal (only patent vessel BK)-->PTA of L Pop and peroneal; b. Ongoing LE ischemia 5 & 06/2016 req amputation of toes on L foot.  . Pneumonia    Hx  of   . Psoriasis ELBOWS  . Psoriasis   . PSORIASIS, SCALP 10/25/2008    Past Surgical History:  Procedure Laterality Date  . AMPUTATION Left 03/27/2016   Procedure: partial first AMPUTATION RAY; LEFT;  Surgeon: Trula Slade, DPM;  Location: [place];  Service: Podiatry;  Laterality: Left;  . AMPUTATION Left 06/12/2016   Procedure: TRANSMETATARSAL AMPUTATION LEFT FOOT;  Surgeon: Newt Minion, MD;  Location: [place];  Service: Orthopedics;  Laterality: Left;  . APPENDECTOMY  1941  . CARDIAC CATHETERIZATION  04-24-11/  DR Rogue Jury ARIDA   MILD NONOBSTRUCTIVE CAD, NORMA CARDIAC OUTPUT  . CARDIOVASCULAR STRESS TEST  2007  . CATARACT EXTRACTION W/  INTRAOCULAR LENS  IMPLANT, BILATERAL Bilateral ~ 2010  . CYSTOSCOPY  12/25/2011   Procedure: CYSTOSCOPY;  Surgeon: Molli Hazard, MD;  Location: [facility];  Service: Urology;  Laterality: N/A;  needs intubation and to be paralyzed   . CYSTOSCOPY W/ RETROGRADES  10/30/2011   Procedure: CYSTOSCOPY WITH RETROGRADE PYELOGRAM;  Surgeon: Molli Hazard, MD;  Location[facility];  Service: Urology;  Laterality: Bilateral;  CYSTOSCOPY POSS TURBT BILATERAL RETROGRADE PYLEOGRAM   . CYSTOSCOPY W/ RETROGRADES  11/30/2012   Procedure: CYSTOSCOPY WITH RETROGRADE PYELOGRAM;  Surgeon: Molli Hazard, MD;  Location[facility];  Service: Urology;  Laterality: Bilateral;  Flexible cystoscopy.  . CYSTOSCOPY WITH BIOPSY  11/30/2012   Procedure: CYSTOSCOPY WITH BIOPSY;  Surgeon: Molli Hazard, MD;  Location: [facility];  Service: Urology;  Laterality: N/A;  . INCISION AND DRAINAGE FOOT Left ~ 2005   LEFT FOOT DUE TO INFECTION FROM  NAIL PUNCTURE INJURY  . LUMBAR LAMINECTOMY/DECOMPRESSION MICRODISCECTOMY N/A 10/18/2016   Procedure: LEFT AND CENTRAL L4-5 LUMBAR LAMINECTOMY WITH RESECTION OF SYNOVIAL CYST;  Surgeon: Jessy Oto, MD;  Location: [place];  Service: Orthopedics;  Laterality: N/A;  . PENILE PROSTHESIS IMPLANT  1990s  . PERIPHERAL VASCULAR CATHETERIZATION N/A 02/14/2016   Procedure: Abdominal Aortogram w/Lower Extremity;  Surgeon: Wellington Hampshire, MD;  Location: [place] [facility];  Service: Cardiovascular;  Laterality: N/A;  . PERIPHERAL VASCULAR CATHETERIZATION Left 02/28/2016   Procedure: Peripheral Vascular Balloon Angioplasty;  Surgeon: Wellington Hampshire, MD;  Location: [place] [facility];  Service: Cardiovascular;  Laterality: Left;  left peroneal and popliteal artery  . [place]  . TOTAL HIP ARTHROPLASTY Left 12/27/2016   Procedure: LEFT TOTAL HIP ARTHROPLASTY ANTERIOR APPROACH;   Surgeon: Mcarthur Rossetti, MD;  Location: [facility];  Service: Orthopedics;  Laterality: Left;  . TRANSURETHRAL RESECTION OF BLADDER TUMOR  10/30/2011   Procedure: TRANSURETHRAL RESECTION OF BLADDER TUMOR (TURBT);  Surgeon: Molli Hazard, MD;  Location: [facility];  Service: Urology;  Laterality: N/A;  . TRANSURETHRAL RESECTION OF BLADDER TUMOR  12/25/2011   Procedure: TRANSURETHRAL RESECTION OF BLADDER TUMOR (TURBT);  Surgeon: Molli Hazard, MD;  Location: [facility];  Service: Urology;  Laterality: N/A;  need long gyrus instruments   . VASECTOMY  1990s     reports that he quit smoking about 51 years ago. His smoking use included cigarettes. He has a 44.00 pack-year smoking history. He has never used smokeless tobacco. He reports that he does not drink alcohol or use drugs.  Allergies  Allergen Reactions  . Actos [Pioglitazone Hydrochloride] Other (See Comments)    Held 2012 bladder cancer  . Aspirin Other (See Comments)    Held 2012 due to hematuria and bruising.   . Ace Inhibitors Cough  . Bactrim Rash and Other (See Comments)    Rash, presumed allergy  . Gabapentin Nausea And Vomiting    Upset stomach and diarrhea - tolerates low doses  . Lyrica [Pregabalin] Swelling    swelling  . Pravastatin Swelling    Swelling of feet  . Sulfa Drugs Cross Reactors Rash    Family History  Problem Relation Age of Onset  . Diabetes Mother   . Heart disease Brother   . Heart disease Brother   . Heart disease Brother   . Heart disease Brother   . Heart disease Brother   . Heart disease Brother   . Lung cancer Brother     Prior to Admission medications   Medication Sig Start Date End Date Taking? Authorizing Provider  acetaminophen (TYLENOL) 500 MG tablet Take 1,000 mg by mouth every 8 (eight) hours as needed for mild pain, moderate pain or headache.   Yes [provider]  amLODipine (NORVASC) 2.5 MG tablet Take 1 tablet (2.5 mg  total) by mouth daily. 01/31/20  Yes Tonia Ghent, MD  Apremilast 30 MG TABS Take 1 tablet by mouth daily. 03/30/19  Yes Tonia Ghent, MD  atorvastatin (LIPITOR) 10 MG tablet TAKE 1 TABLET BY MOUTH  DAILY Patient taking differently: Take 10 mg by mouth daily.  07/12/19  Yes Tonia Ghent, MD  budesonide-formoterol [facility]) 160-4.5 MCG/ACT inhaler Inhale 2 puffs into the lungs 2 (two) times daily as needed (shortness of breath).  01/21/14  Yes Tonia Ghent, MD  clobetasol (TEMOVATE) 0.05 % external solution Apply 1 application topically daily as needed (rash).    Yes [provider]  clopidogrel (PLAVIX) 75 MG tablet TAKE 1 TABLET BY MOUTH  DAILY Patient taking differently: Take 75 mg by mouth daily.  06/18/19  Yes Tonia Ghent,  MD  furosemide (LASIX) 40 MG tablet TAKE 1 TABLET BY MOUTH  EVERY MONDAY, WEDNESDAY,  AND FRIDAY Patient taking differently: Take 40 mg by mouth every Monday, Wednesday, and Friday.  06/11/19  Yes Tonia Ghent, MD  glipiZIDE (GLUCOTROL XL) 5 MG 24 hr tablet TAKE 1 TABLET BY MOUTH  DAILY WITH BREAKFAST Patient taking differently: Take 5 mg by mouth daily with breakfast.  06/18/19  Yes Tonia Ghent, MD  insulin glargine (LANTUS) 100 UNIT/ML injection Inject 0.15 mLs (15 Units total) into the skin daily. Patient taking differently: Inject 15 Units into the skin at bedtime.  10/17/16  Yes Tonia Ghent, MD  nystatin (MYCOSTATIN/NYSTOP) powder Apply to affected area twice a day Patient taking differently: Apply 1 application topically 2 (two) times daily as needed (fungal infections).  05/11/19  Yes Tonia Ghent, MD  tiotropium (SPIRIVA) 18 MCG inhalation capsule Place 1 capsule (18 mcg total) into inhaler and inhale daily. 09/25/18  Yes Tonia Ghent, MD  escitalopram (LEXAPRO) 5 MG tablet Take 1 tablet (5 mg total) by mouth daily. Patient not taking: Reported on 01/31/2020 12/13/19   Tonia Ghent, MD    Physical Exam: Vitals:    04/18/20 1645 04/18/20 1715 04/18/20 1730 04/18/20 1745  BP: 122/62 114/66 118/68 125/62  Pulse: 79 75 74 76  Resp: 20 14 16 14   Temp:    98.4 F (36.9 C)  TempSrc:    Oral  SpO2: 98% 95% 96% 96%    Constitutional: NAD, calm, comfortable Vitals:   04/18/20 1645 04/18/20 1715 04/18/20 1730 04/18/20 1745  BP: 122/62 114/66 118/68 125/62  Pulse: 79 75 74 76  Resp: 20 14 16 14   Temp:    98.4 F (36.9 C)  TempSrc:    Oral  SpO2: 98% 95% 96% 96%   General:  Pleasantly resting in bed, No acute distress. HEENT:  Normocephalic atraumatic.  Sclerae nonicteric, noninjected.  Extraocular movements intact bilaterally. Neck:  Without mass or deformity.  Trachea is midline. Lungs:  Clear to auscultate bilaterally without rhonchi, wheeze, or rales. Heart:  Regular rate and rhythm.  Without murmurs, rubs, or gallops. Abdomen:  Soft, nontender, nondistended.  Without guarding or rebound. Extremities: Without cyanosis, clubbing, edema, left foot transmetatarsal amputation. Vascular:  Dorsalis pedis and posterior tibial pulses palpable bilaterally. Skin:  Warm and dry, no erythema, no ulcerations.  Labs on Admission: I have personally reviewed following labs and imaging studies  CBC: Recent Labs  Lab 04/18/20 1136  WBC 8.0  NEUTROABS 5.6  HGB 13.9  HCT 43.0  MCV 101.4*  PLT 427   Basic Metabolic Panel: Recent Labs  Lab 04/18/20 1136  NA 138  K 5.1  CL 98  CO2 29  GLUCOSE 271*  BUN 32*  CREATININE 1.93*  CALCIUM 8.4*   GFR: CrCl cannot be calculated (Unknown ideal weight.). Liver Function Tests: No results for input(s): AST, ALT, ALKPHOS, BILITOT, PROT, ALBUMIN in the last 168 hours. No results for input(s): LIPASE, AMYLASE in the last 168 hours. No results for input(s): AMMONIA in the last 168 hours. Coagulation Profile: No results for input(s): INR, PROTIME in the last 168 hours. Cardiac Enzymes: No results for input(s): CKTOTAL, CKMB, CKMBINDEX, TROPONINI in the last  168 hours. BNP (last 3 results) No results for input(s): PROBNP in the last 8760 hours. HbA1C: No results for input(s): HGBA1C in the last 72 hours. CBG: No results for input(s): GLUCAP in the last 168 hours. Lipid Profile: No results  for input(s): CHOL, HDL, LDLCALC, TRIG, CHOLHDL, LDLDIRECT in the last 72 hours. Thyroid Function Tests: No results for input(s): TSH, T4TOTAL, FREET4, T3FREE, THYROIDAB in the last 72 hours. Anemia Panel: No results for input(s): VITAMINB12, FOLATE, FERRITIN, TIBC, IRON, RETICCTPCT in the last 72 hours. Urine analysis:    Component Value Date/Time   COLORURINE YELLOW 12/28/2016 1520   APPEARANCEUR TURBID (A) 12/28/2016 1520   LABSPEC 1.017 12/28/2016 1520   PHURINE 5.0 12/28/2016 1520   GLUCOSEU NEGATIVE 12/28/2016 1520   HGBUR MODERATE (A) 12/28/2016 1520   HGBUR trace-intact 11/25/2007 0937   BILIRUBINUR NEGATIVE 12/28/2016 1520   KETONESUR 5 (A) 12/28/2016 1520   PROTEINUR 30 (A) 12/28/2016 1520   UROBILINOGEN 0.2 01/18/2014 0719   NITRITE NEGATIVE 12/28/2016 1520   LEUKOCYTESUR LARGE (A) 12/28/2016 1520    Radiological Exams on Admission: DG Chest 1 View  Result Date: 04/18/2020 CLINICAL DATA:  Pain following fall EXAM: CHEST  1 VIEW COMPARISON:  September 25, 2018 FINDINGS: Lungs are clear. Heart size and pulmonary vascularity are normal. No adenopathy. There is aortic atherosclerosis. No pneumothorax. No fracture noted in the chest region. IMPRESSION: Lungs clear. Cardiac silhouette normal. Aortic Atherosclerosis (ICD10-I70.0). Electronically Signed   By: Lowella Grip III M.D.   On: 04/18/2020 13:44   DG Pelvis Portable  Result Date: 04/18/2020 CLINICAL DATA:  Left hip pain after fall EXAM: PORTABLE PELVIS 1-2 VIEWS COMPARISON:  12/27/2016 FINDINGS: There is an obliquely oriented periprosthetic fracture deformity involving the proximal left femur. Fracture line extends into the greater trochanter. The fracture fragments are in near  anatomic alignment. Moderate to advanced right hip osteoarthritis. Penile prosthesis again noted IMPRESSION: Acute obliquely oriented periprosthetic fracture deformity involves the proximal left femur. Electronically Signed   By: Kerby Moors M.D.   On: 04/18/2020 11:37   DG Hip Unilat W or Wo Pelvis 2-3 Views Left  Result Date: 04/18/2020 CLINICAL DATA:  Pain following fall EXAM: DG HIP (WITH OR WITHOUT PELVIS) 2-3V LEFT COMPARISON:  Pelvis radiograph April 18, 2020 FINDINGS: Frontal and lateral views obtained. There is a total hip replacement on the left. There is a comminuted fracture of the left proximal femur. Specifically, there is an obliquely oriented periprostatic fracture medially which extends into the proximal femoral diaphysis. There is medial displacement of the distal aspect of this fracture. There is also avulsion along the lateral aspect of the greater trochanter. Appearance stable compared to earlier in the day. No dislocation. No erosive change. Penile prosthesis noted. IMPRESSION: Total hip replacement on the left with fractures along the greater tuberosity and medial proximal femoral diaphysis. The displacement of fracture fragments is stable compared to earlier in the day. The alignment of the total hip replacement is unchanged with prosthetic components remaining well seated. No dislocation. Electronically Signed   By: Lowella Grip III M.D.   On: 04/18/2020 13:43    [facility] For contact please use secure messenger on Epic  If 7PM-7AM, please contact night-coverage located on [email or web address]   04/18/2020, 7:26 PM

## 2020-04-18 NOTE — Consult Note (Signed)
Reason for Consult:Left hip fx Referring Physician: D Mykell Rawl is an 86 y.o. male.  HPI: [name] Small was leaving an establishment after getting a new RW and tripped in the parking lot and fell. He had immediate left hip pain and could not get up. He was brought to [facility] where x-rays showed a periprosthetic hip fx and orthopedic surgery was consulted. He c/o localized pain to the area.   Past Medical History:  Diagnosis Date  . (HFpEF) heart failure with preserved ejection fraction ([place])   . Arthritis   . Back pain    s/p lumbar injection 2014  . BENIGN PROSTATIC HYPERTROPHY 05/21/2007  . Bladder cancer ([place]) 10/2011  . CAD (coronary artery disease)    nonobstructive by cath 6/12:  mid LAD 30%, proximal obtuse marginal-2 30%, proximal RCA 20%, mid RCA 30-40%.  He had normal cardiac output and mildly elevated filling pressures but no significant pulmonary hypertension;   Echocardiogram in May 2012 demonstrated EF 50-55% and left atrial enlargement   . Cellulitis of left foot    Hospitalized in 2006  . Chronic kidney disease (CKD), stage III (moderate) 12/04/2007   FOLLOWED BY DR PATEL  . Chronic pain of lower extremity   . Complication of anesthesia    1 time bladder cancer surgery- 2012 , oxygen satruratioon dropped had to stay overnight, no problems since then.  Marland Kitchen COPD (chronic obstructive pulmonary disease) ([place])   . DIABETES MELLITUS, TYPE II 05/21/2007  . DIVERTICULOSIS, COLON 05/21/2007   pt denies  . Dyspnea    with exertion, patient mointors saturation  . Fatigue AGE-RELATED  . Gross hematuria    pt denies  . HYPERTENSION 05/21/2007  . Impaired hearing BILATERAL HEARING AIDS  . On home oxygen therapy    "2L at nighttime" (02/28/2016)  . PAD (peripheral artery disease) ([place])    a. 02/2016 L foot nonhealing ulcer-->Periph angio: L pop 100 w/ reconstitution via extensive vollats to the prox-mid peroneal (only patent vessel BK)-->PTA of L Pop and peroneal; b. Ongoing LE  ischemia 5 & 06/2016 req amputation of toes on L foot.  . Pneumonia    Hx  of   . Psoriasis ELBOWS  . Psoriasis   . PSORIASIS, SCALP 10/25/2008    Past Surgical History:  Procedure Laterality Date  . AMPUTATION Left 03/27/2016   Procedure: partial first AMPUTATION RAY; LEFT;  Surgeon: Trula Slade, DPM;  Location: [place];  Service: Podiatry;  Laterality: Left;  . AMPUTATION Left 06/12/2016   Procedure: TRANSMETATARSAL AMPUTATION LEFT FOOT;  Surgeon: Newt Minion, MD;  Location: [place];  Service: Orthopedics;  Laterality: Left;  . APPENDECTOMY  1941  . CARDIAC CATHETERIZATION  04-24-11/  DR Rogue Jury ARIDA   MILD NONOBSTRUCTIVE CAD, NORMA CARDIAC OUTPUT  . CARDIOVASCULAR STRESS TEST  2007  . CATARACT EXTRACTION W/ INTRAOCULAR LENS  IMPLANT, BILATERAL Bilateral ~ 2010  . CYSTOSCOPY  12/25/2011   Procedure: CYSTOSCOPY;  Surgeon: Molli Hazard, MD;  Location: [facility];  Service: Urology;  Laterality: N/A;  needs intubation and to be paralyzed   . CYSTOSCOPY W/ RETROGRADES  10/30/2011   Procedure: CYSTOSCOPY WITH RETROGRADE PYELOGRAM;  Surgeon: Molli Hazard, MD;  Location[facility];  Service: Urology;  Laterality: Bilateral;  CYSTOSCOPY POSS TURBT BILATERAL RETROGRADE PYLEOGRAM   . CYSTOSCOPY W/ RETROGRADES  11/30/2012   Procedure: CYSTOSCOPY WITH RETROGRADE PYELOGRAM;  Surgeon: Molli Hazard, MD;  Location: [facility];  Service:  Urology;  Laterality: Bilateral;  Flexible cystoscopy.  . CYSTOSCOPY WITH BIOPSY  11/30/2012   Procedure: CYSTOSCOPY WITH BIOPSY;  Surgeon: Molli Hazard, MD;  Location: [facility];  Service: Urology;  Laterality: N/A;  . INCISION AND DRAINAGE FOOT Left ~ 2005   LEFT FOOT DUE TO INFECTION FROM  NAIL PUNCTURE INJURY  . LUMBAR LAMINECTOMY/DECOMPRESSION MICRODISCECTOMY N/A 10/18/2016   Procedure: LEFT AND CENTRAL L4-5 LUMBAR LAMINECTOMY WITH RESECTION OF SYNOVIAL CYST;   Surgeon: Jessy Oto, MD;  Location: [place];  Service: Orthopedics;  Laterality: N/A;  . PENILE PROSTHESIS IMPLANT  1990s  . PERIPHERAL VASCULAR CATHETERIZATION N/A 02/14/2016   Procedure: Abdominal Aortogram w/Lower Extremity;  Surgeon: Wellington Hampshire, MD;  Location: [place] [facility];  Service: Cardiovascular;  Laterality: N/A;  . PERIPHERAL VASCULAR CATHETERIZATION Left 02/28/2016   Procedure: Peripheral Vascular Balloon Angioplasty;  Surgeon: Wellington Hampshire, MD;  Location: [place] [facility];  Service: Cardiovascular;  Laterality: Left;  left peroneal and popliteal artery  . [place]  . TOTAL HIP ARTHROPLASTY Left 12/27/2016   Procedure: LEFT TOTAL HIP ARTHROPLASTY ANTERIOR APPROACH;  Surgeon: Mcarthur Rossetti, MD;  Location: [facility];  Service: Orthopedics;  Laterality: Left;  . TRANSURETHRAL RESECTION OF BLADDER TUMOR  10/30/2011   Procedure: TRANSURETHRAL RESECTION OF BLADDER TUMOR (TURBT);  Surgeon: Molli Hazard, MD;  Location[facility];  Service: Urology;  Laterality: N/A;  . TRANSURETHRAL RESECTION OF BLADDER TUMOR  12/25/2011   Procedure: TRANSURETHRAL RESECTION OF BLADDER TUMOR (TURBT);  Surgeon: Molli Hazard, MD;  Location: [facility];  Service: Urology;  Laterality: N/A;  need long gyrus instruments   . VASECTOMY  1990s    Family History  Problem Relation Age of Onset  . Diabetes Mother   . Heart disease Brother   . Heart disease Brother   . Heart disease Brother   . Heart disease Brother   . Heart disease Brother   . Heart disease Brother   . Lung cancer Brother     Social History:  reports that he quit smoking about 51 years ago. His smoking use included cigarettes. He has a 44.00 pack-year smoking history. He has never used smokeless tobacco. He reports that he does not drink alcohol or use drugs.  Allergies:  Allergies  Allergen Reactions  . Actos [Pioglitazone Hydrochloride]  Other (See Comments)    Held 2012 bladder cancer  . Aspirin Other (See Comments)    Held 2012 due to hematuria and bruising.   . Ace Inhibitors Cough  . Bactrim Rash and Other (See Comments)    Rash, presumed allergy  . Gabapentin Nausea And Vomiting    Upset stomach and diarrhea - tolerates low doses  . Lyrica [Pregabalin] Swelling    swelling  . Pravastatin Swelling    Swelling of feet  . Sulfa Drugs Cross Reactors Rash    Medications: I have reviewed the patient's current medications.  Results for orders placed or performed during the hospital encounter of 04/18/20 (from the past 48 hour(s))  CBC with Differential     Status: Abnormal   Collection Time: 04/18/20 11:36 AM  Result Value Ref Range   WBC 8.0 4.0 - 10.5 K/uL   RBC 4.24 4.22 - 5.81 MIL/uL   Hemoglobin 13.9 13.0 - 17.0 g/dL   HCT 43.0 39.0 - 52.0 %   MCV 101.4 (H) 80.0 - 100.0 fL   MCH 32.8 26.0 - 34.0 pg  MCHC 32.3 30.0 - 36.0 g/dL   RDW 12.5 11.5 - 15.5 %   Platelets 221 150 - 400 K/uL   nRBC 0.0 0.0 - 0.2 %   Neutrophils Relative % 71 %   Neutro Abs 5.6 1.7 - 7.7 K/uL   Lymphocytes Relative 16 %   Lymphs Abs 1.3 0.7 - 4.0 K/uL   Monocytes Relative 9 %   Monocytes Absolute 0.7 0.1 - 1.0 K/uL   Eosinophils Relative 2 %   Eosinophils Absolute 0.2 0.0 - 0.5 K/uL   Basophils Relative 1 %   Basophils Absolute 0.1 0.0 - 0.1 K/uL   Immature Granulocytes 1 %   Abs Immature Granulocytes 0.11 (H) 0.00 - 0.07 K/uL    Comment: Performed at [facility], [place] [address], [place], [place] [address]  Basic metabolic panel     Status: Abnormal   Collection Time: 04/18/20 11:36 AM  Result Value Ref Range   Sodium 138 135 - 145 mmol/L   Potassium 5.1 3.5 - 5.1 mmol/L   Chloride 98 98 - 111 mmol/L   CO2 29 22 - 32 mmol/L   Glucose, Bld 271 (H) 70 - 99 mg/dL    Comment: Glucose reference range applies only to samples taken after fasting for at least 8 hours.   BUN 32 (H) 8 - 23 mg/dL   Creatinine,  Ser 1.93 (H) 0.61 - 1.24 mg/dL   Calcium 8.4 (L) 8.9 - 10.3 mg/dL   GFR calc non Af Amer 30 (L) >60 mL/min   GFR calc Af Amer 35 (L) >60 mL/min   Anion gap 11 5 - 15    Comment: Performed at [facility], [place] [address], [place],  [address]  Resp Panel by RT PCR (RSV, Flu A&B, Covid) - Nasopharyngeal Swab     Status: None   Collection Time: 04/18/20 11:36 AM   Specimen: Nasopharyngeal Swab  Result Value Ref Range   SARS Coronavirus 2 by RT PCR NEGATIVE NEGATIVE    Comment: (NOTE) SARS-CoV-2 target nucleic acids are NOT DETECTED. The SARS-CoV-2 RNA is generally detectable in upper respiratoy specimens during the acute phase of infection. The lowest concentration of SARS-CoV-2 viral copies this assay can detect is 131 copies/mL. A negative result does not preclude SARS-Cov-2 infection and should not be used as the sole basis for treatment or other patient management decisions. A negative result may occur with  improper specimen collection/handling, submission of specimen other than nasopharyngeal swab, presence of viral mutation(s) within the areas targeted by this assay, and inadequate number of viral copies (<131 copies/mL). A negative result must be combined with clinical observations, patient history, and epidemiological information. The expected result is Negative. Fact Sheet for Patients:  PinkCheek.be Fact Sheet for Healthcare Providers:  GravelBags.it This test is not yet ap proved or cleared by the [place] FDA and  has been authorized for detection and/or diagnosis of SARS-CoV-2 by FDA under an Emergency Use Authorization (EUA). This EUA will remain  in effect (meaning this test can be used) for the duration of the COVID-19 declaration under Section 564(b)(1) of the Act, 21 U.S.C. section 360bbb-3(b)(1), unless the authorization is terminated or revoked sooner.    Influenza A by PCR  NEGATIVE NEGATIVE   Influenza B by PCR NEGATIVE NEGATIVE    Comment: (NOTE) The Xpert Xpress SARS-CoV-2/FLU/RSV assay is intended as an aid in  the diagnosis of influenza from Nasopharyngeal swab specimens and  should not be used as a  sole basis for treatment. Nasal washings and  aspirates are unacceptable for Xpert Xpress SARS-CoV-2/FLU/RSV  testing. Fact Sheet for Patients: PinkCheek.be Fact Sheet for Healthcare Providers: GravelBags.it This test is not yet approved or cleared by the [place] FDA and  has been authorized for detection and/or diagnosis of SARS-CoV-2 by  FDA under an Emergency Use Authorization (EUA). This EUA will remain  in effect (meaning this test can be used) for the duration of the  Covid-19 declaration under Section 564(b)(1) of the Act, 21  U.S.C. section 360bbb-3(b)(1), unless the authorization is  terminated or revoked.    Respiratory Syncytial Virus by PCR NEGATIVE NEGATIVE    Comment: (NOTE) Fact Sheet for Patients: PinkCheek.be Fact Sheet for Healthcare Providers: GravelBags.it This test is not yet approved or cleared by the [place] FDA and  has been authorized for detection and/or diagnosis of SARS-CoV-2 by  FDA under an Emergency Use Authorization (EUA). This EUA will remain  in effect (meaning this test can be used) for the duration of the  COVID-19 declaration under Section 564(b)(1) of the Act, 21 U.S.C.  section 360bbb-3(b)(1), unless the authorization is terminated or  revoked. Performed at [facility], [place] [address] [place], [place] [address]     DG Pelvis Portable  Result Date: 04/18/2020 CLINICAL DATA:  Left hip pain after fall EXAM: PORTABLE PELVIS 1-2 VIEWS COMPARISON:  12/27/2016 FINDINGS: There is an obliquely oriented periprosthetic fracture deformity involving the proximal left femur.  Fracture line extends into the greater trochanter. The fracture fragments are in near anatomic alignment. Moderate to advanced right hip osteoarthritis. Penile prosthesis again noted IMPRESSION: Acute obliquely oriented periprosthetic fracture deformity involves the proximal left femur. Electronically Signed   By: Kerby Moors M.D.   On: 04/18/2020 11:37    Review of Systems  HENT: Negative for ear discharge, ear pain, hearing loss and tinnitus.   Eyes: Negative for photophobia and pain.  Respiratory: Negative for cough and shortness of breath.   Cardiovascular: Negative for chest pain.  Gastrointestinal: Negative for abdominal pain, nausea and vomiting.  Genitourinary: Negative for dysuria, flank pain, frequency and urgency.  Musculoskeletal: Positive for arthralgias (Left hip). Negative for back pain, myalgias and neck pain.  Neurological: Negative for dizziness and headaches.  Hematological: Does not bruise/bleed easily.  Psychiatric/Behavioral: The patient is not nervous/anxious.    Blood pressure 114/70, pulse 74, temperature 98.4 F (36.9 C), resp. rate 18, SpO2 98 %. Physical Exam  Constitutional: He appears well-developed and well-nourished. No distress.  HENT:  Head: Normocephalic and atraumatic.  Eyes: Conjunctivae are normal. Right eye exhibits no discharge. Left eye exhibits no discharge. No scleral icterus.  Cardiovascular: Normal rate and regular rhythm.  Respiratory: Effort normal. No respiratory distress.  Musculoskeletal:     Cervical back: Normal range of motion.     Comments: LLE No traumatic wounds, ecchymosis, or rash  S/p TMT amputation, mod hip TTP  No knee or ankle effusion  Knee stable to varus/ valgus and anterior/posterior stress  Sens intact  Motor ext, flex, evers 5/5  DP 1+, PT 0, No significant edema  Neurological: He is alert.  Skin: Skin is warm and dry. He is not diaphoretic.  Psychiatric: He has a normal mood and affect. His behavior is normal.     Assessment/Plan: Left hip fx -- Will need repair by Dr. Ninfa Linden. Due to OR availability we have requested he be transferred to [facility]. Multiple medical problems including CHF, BPH, CAD, CKD, COPD, and  DM -- We have asked medicine to admit, manage, and clear. Appreciate their help.    Lisette Abu, PA-C Orthopedic Surgery [phone] 04/18/2020, 1:37 PM

## 2020-04-19 ENCOUNTER — Inpatient Hospital Stay (HOSPITAL_COMMUNITY): Payer: Medicare Other

## 2020-04-19 ENCOUNTER — Inpatient Hospital Stay (HOSPITAL_COMMUNITY): Payer: Medicare Other | Admitting: Certified Registered Nurse Anesthetist

## 2020-04-19 ENCOUNTER — Encounter (HOSPITAL_COMMUNITY): Payer: Self-pay | Admitting: Internal Medicine

## 2020-04-19 ENCOUNTER — Encounter (HOSPITAL_COMMUNITY)
Admission: EM | Disposition: A | Discharge status: Rehabilitation Facility | Payer: Self-pay | Source: Home / Self Care | Attending: Family Medicine

## 2020-04-19 DIAGNOSIS — S72002A Fracture of unspecified part of neck of left femur, initial encounter for closed fracture: Secondary | ICD-10-CM

## 2020-04-19 HISTORY — PX: ORIF FEMUR FRACTURE: SHX2119

## 2020-04-19 LAB — GLUCOSE, CAPILLARY
Glucose-Capillary: 223 mg/dL — ABNORMAL HIGH (ref 70–99)
Glucose-Capillary: 159 mg/dL — ABNORMAL HIGH (ref 70–99)
Glucose-Capillary: 109 mg/dL — ABNORMAL HIGH (ref 70–99)
Glucose-Capillary: 118 mg/dL — ABNORMAL HIGH (ref 70–99)
Glucose-Capillary: 195 mg/dL — ABNORMAL HIGH (ref 70–99)

## 2020-04-19 LAB — HEMOGLOBIN A1C
Hgb A1c MFr Bld: 9.3 % — ABNORMAL HIGH (ref 4.8–5.6)
Mean Plasma Glucose: 220.21 mg/dL

## 2020-04-19 LAB — MRSA PCR SCREENING: MRSA by PCR: NEGATIVE

## 2020-04-19 SURGERY — OPEN REDUCTION INTERNAL FIXATION FEMORAL SHAFT FRACTURE
Anesthesia: General | Site: Leg Upper | Laterality: Left

## 2020-04-19 MED ORDER — ACETAMINOPHEN 10 MG/ML IV SOLN
INTRAVENOUS | Status: AC
  Filled 2020-04-19: qty 100

## 2020-04-19 MED ORDER — ALBUMIN HUMAN 5 % IV SOLN: INTRAVENOUS | Status: DC | PRN

## 2020-04-19 MED ORDER — ENOXAPARIN SODIUM 40 MG/0.4ML ~~LOC~~ SOLN
40.0000 mg | SUBCUTANEOUS | Status: DC
  Administered 2020-04-20 – 2020-04-21 (×2): 40 mg via SUBCUTANEOUS
  Filled 2020-04-19 (×3): qty 0.4

## 2020-04-19 MED ORDER — HYDROCODONE-ACETAMINOPHEN 7.5-325 MG PO TABS
1.0000 | ORAL_TABLET | Freq: Four times a day (QID) | ORAL | Status: DC | PRN
  Administered 2020-04-21: 2 via ORAL
  Administered 2020-04-22 – 2020-04-23 (×3): 1 via ORAL
  Filled 2020-04-19: qty 2
  Filled 2020-04-19 (×3): qty 1

## 2020-04-19 MED ORDER — CEFAZOLIN SODIUM-DEXTROSE 2-4 GM/100ML-% IV SOLN
2.0000 g | Freq: Once | INTRAVENOUS | Status: AC
  Administered 2020-04-19: 2 g via INTRAVENOUS

## 2020-04-19 MED ORDER — FENTANYL CITRATE (PF) 100 MCG/2ML IJ SOLN
INTRAMUSCULAR | Status: AC
  Filled 2020-04-19: qty 2

## 2020-04-19 MED ORDER — ROCURONIUM BROMIDE 10 MG/ML (PF) SYRINGE
PREFILLED_SYRINGE | INTRAVENOUS | Status: DC | PRN
  Administered 2020-04-19: 30 mg via INTRAVENOUS
  Administered 2020-04-19: 70 mg via INTRAVENOUS

## 2020-04-19 MED ORDER — ACETAMINOPHEN 10 MG/ML IV SOLN
INTRAVENOUS | Status: DC | PRN
  Administered 2020-04-19: 1000 mg via INTRAVENOUS

## 2020-04-19 MED ORDER — PROPOFOL 10 MG/ML IV BOLUS
INTRAVENOUS | Status: AC
  Filled 2020-04-19: qty 20

## 2020-04-19 MED ORDER — PHENYLEPHRINE 40 MCG/ML (10ML) SYRINGE FOR IV PUSH (FOR BLOOD PRESSURE SUPPORT)
PREFILLED_SYRINGE | INTRAVENOUS | Status: DC | PRN
  Administered 2020-04-19 (×3): 80 ug via INTRAVENOUS

## 2020-04-19 MED ORDER — SODIUM CHLORIDE 0.9 % IV SOLN: INTRAVENOUS | Status: DC

## 2020-04-19 MED ORDER — METHOCARBAMOL 500 MG PO TABS
500.0000 mg | ORAL_TABLET | Freq: Four times a day (QID) | ORAL | Status: DC | PRN
  Administered 2020-04-20 – 2020-04-26 (×8): 500 mg via ORAL
  Filled 2020-04-19 (×8): qty 1

## 2020-04-19 MED ORDER — LIDOCAINE 2% (20 MG/ML) 5 ML SYRINGE
INTRAMUSCULAR | Status: DC | PRN
  Administered 2020-04-19: 40 mg via INTRAVENOUS

## 2020-04-19 MED ORDER — PROPOFOL 10 MG/ML IV BOLUS
INTRAVENOUS | Status: DC | PRN
  Administered 2020-04-19: 100 mg via INTRAVENOUS

## 2020-04-19 MED ORDER — CHLORHEXIDINE GLUCONATE 0.12 % MT SOLN
OROMUCOSAL | Status: AC
  Administered 2020-04-19: 15 mL via OROMUCOSAL
  Filled 2020-04-19: qty 15

## 2020-04-19 MED ORDER — ORAL CARE MOUTH RINSE: 15.0000 mL | Freq: Once | OROMUCOSAL | Status: AC

## 2020-04-19 MED ORDER — HYDROCODONE-ACETAMINOPHEN 5-325 MG PO TABS
1.0000 | ORAL_TABLET | Freq: Four times a day (QID) | ORAL | Status: DC | PRN
  Administered 2020-04-20 – 2020-04-21 (×2): 2 via ORAL
  Administered 2020-04-21 – 2020-04-23 (×4): 1 via ORAL
  Administered 2020-04-24 – 2020-04-25 (×3): 2 via ORAL
  Filled 2020-04-19: qty 1
  Filled 2020-04-19: qty 2
  Filled 2020-04-19: qty 1
  Filled 2020-04-19 (×2): qty 2
  Filled 2020-04-19: qty 1
  Filled 2020-04-19 (×2): qty 2
  Filled 2020-04-19: qty 1

## 2020-04-19 MED ORDER — ONDANSETRON HCL 4 MG/2ML IJ SOLN
INTRAMUSCULAR | Status: DC | PRN
  Administered 2020-04-19: 4 mg via INTRAVENOUS

## 2020-04-19 MED ORDER — CHLORHEXIDINE GLUCONATE 0.12 % MT SOLN: 15.0000 mL | Freq: Once | OROMUCOSAL | Status: AC

## 2020-04-19 MED ORDER — FENTANYL CITRATE (PF) 250 MCG/5ML IJ SOLN
INTRAMUSCULAR | Status: AC
  Filled 2020-04-19: qty 5

## 2020-04-19 MED ORDER — DEXAMETHASONE SODIUM PHOSPHATE 10 MG/ML IJ SOLN
INTRAMUSCULAR | Status: DC | PRN
  Administered 2020-04-19: 4 mg via INTRAVENOUS

## 2020-04-19 MED ORDER — PHENYLEPHRINE HCL-NACL 10-0.9 MG/250ML-% IV SOLN
INTRAVENOUS | Status: DC | PRN
  Administered 2020-04-19: 50 ug/min via INTRAVENOUS

## 2020-04-19 MED ORDER — VANCOMYCIN HCL 1000 MG IV SOLR
INTRAVENOUS | Status: AC
  Filled 2020-04-19: qty 1000

## 2020-04-19 MED ORDER — 0.9 % SODIUM CHLORIDE (POUR BTL) OPTIME
TOPICAL | Status: DC | PRN
  Administered 2020-04-19: 1000 mL

## 2020-04-19 MED ORDER — SUGAMMADEX SODIUM 200 MG/2ML IV SOLN
INTRAVENOUS | Status: DC | PRN
  Administered 2020-04-19: 200 mg via INTRAVENOUS

## 2020-04-19 MED ORDER — CHLORHEXIDINE GLUCONATE CLOTH 2 % EX PADS
6.0000 | MEDICATED_PAD | Freq: Every day | CUTANEOUS | Status: DC
  Administered 2020-04-19 – 2020-04-26 (×6): 6 via TOPICAL

## 2020-04-19 MED ORDER — VANCOMYCIN HCL 1000 MG IV SOLR
INTRAVENOUS | Status: DC | PRN
  Administered 2020-04-19: 1000 mg

## 2020-04-19 MED ORDER — HYDROMORPHONE HCL 1 MG/ML IJ SOLN
1.0000 mg | INTRAMUSCULAR | Status: DC | PRN
  Administered 2020-04-19 – 2020-04-26 (×3): 1 mg via INTRAVENOUS
  Filled 2020-04-19 (×3): qty 1

## 2020-04-19 MED ORDER — CEFAZOLIN SODIUM-DEXTROSE 2-4 GM/100ML-% IV SOLN
2.0000 g | Freq: Three times a day (TID) | INTRAVENOUS | Status: AC
  Administered 2020-04-19 – 2020-04-20 (×3): 2 g via INTRAVENOUS
  Filled 2020-04-19 (×3): qty 100

## 2020-04-19 MED ORDER — DEXAMETHASONE SODIUM PHOSPHATE 10 MG/ML IJ SOLN
INTRAMUSCULAR | Status: AC
  Filled 2020-04-19: qty 1

## 2020-04-19 MED ORDER — FENTANYL CITRATE (PF) 100 MCG/2ML IJ SOLN
25.0000 ug | INTRAMUSCULAR | Status: DC | PRN
  Administered 2020-04-19: 25 ug via INTRAVENOUS

## 2020-04-19 MED ORDER — CEFAZOLIN SODIUM-DEXTROSE 2-4 GM/100ML-% IV SOLN
INTRAVENOUS | Status: AC
  Filled 2020-04-19: qty 100

## 2020-04-19 MED ORDER — FENTANYL CITRATE (PF) 250 MCG/5ML IJ SOLN
INTRAMUSCULAR | Status: DC | PRN
  Administered 2020-04-19 (×2): 50 ug via INTRAVENOUS
  Administered 2020-04-19 (×2): 25 ug via INTRAVENOUS
  Administered 2020-04-19: 50 ug via INTRAVENOUS

## 2020-04-19 SURGICAL SUPPLY — 66 items
APL PRP STRL LF DISP 70% ISPRP (MISCELLANEOUS) ×2
BIT DRILL 4.3 (BIT) ×2
BIT DRILL 4.3X300MM (BIT) IMPLANT
BIT DRILL LONG 3.3 (BIT) ×1 IMPLANT
BIT DRILL QC 3.3X195 (BIT) ×1 IMPLANT
BRUSH SCRUB EZ PLAIN DRY (MISCELLANEOUS) ×3 IMPLANT
CABLE CERLAGE W/CRIMP 1.8 (Cable) ×2 IMPLANT
CAP LOCK NCB (Cap) ×7 IMPLANT
CHLORAPREP W/TINT 26 (MISCELLANEOUS) ×4 IMPLANT
COVER SURGICAL LIGHT HANDLE (MISCELLANEOUS) ×4 IMPLANT
COVER WAND RF STERILE (DRAPES) ×1 IMPLANT
DRAPE C-ARM 42X72 X-RAY (DRAPES) ×2 IMPLANT
DRAPE C-ARMOR (DRAPES) ×2 IMPLANT
DRAPE IMP U-DRAPE 54X76 (DRAPES) ×2 IMPLANT
DRAPE U-SHAPE 47X51 STRL (DRAPES) ×2 IMPLANT
DRSG ADAPTIC 3X8 NADH LF (GAUZE/BANDAGES/DRESSINGS) ×1 IMPLANT
DRSG MEPILEX BORDER 4X12 (GAUZE/BANDAGES/DRESSINGS) ×1 IMPLANT
DRSG MEPILEX BORDER 4X4 (GAUZE/BANDAGES/DRESSINGS) ×1 IMPLANT
DRSG MEPILEX BORDER 4X8 (GAUZE/BANDAGES/DRESSINGS) ×1 IMPLANT
DRSG PAD ABDOMINAL 8X10 ST (GAUZE/BANDAGES/DRESSINGS) ×4 IMPLANT
ELECT REM PT RETURN 9FT ADLT (ELECTROSURGICAL) ×2
ELECTRODE REM PT RTRN 9FT ADLT (ELECTROSURGICAL) ×1 IMPLANT
EVACUATOR 1/8 PVC DRAIN (DRAIN) IMPLANT
GLOVE BIO SURGEON STRL SZ 6.5 (GLOVE) ×6 IMPLANT
GLOVE BIO SURGEON STRL SZ7.5 (GLOVE) ×8 IMPLANT
GLOVE BIOGEL PI IND STRL 6.5 (GLOVE) ×1 IMPLANT
GLOVE BIOGEL PI IND STRL 7.5 (GLOVE) ×1 IMPLANT
GLOVE BIOGEL PI INDICATOR 6.5 (GLOVE) ×1
GLOVE BIOGEL PI INDICATOR 7.5 (GLOVE) ×1
GOWN STRL REUS W/ TWL LRG LVL3 (GOWN DISPOSABLE) ×2 IMPLANT
GOWN STRL REUS W/TWL LRG LVL3 (GOWN DISPOSABLE) ×4
K-WIRE 2.0 (WIRE) ×2
K-WIRE FXSTD 280X2XNS SS (WIRE) ×1
KIT BASIN OR (CUSTOM PROCEDURE TRAY) ×2 IMPLANT
KIT TURNOVER KIT B (KITS) ×2 IMPLANT
KWIRE FXSTD 280X2XNS SS (WIRE) IMPLANT
MANIFOLD NEPTUNE II (INSTRUMENTS) ×1 IMPLANT
NS IRRIG 1000ML POUR BTL (IV SOLUTION) ×1 IMPLANT
PACK TOTAL JOINT (CUSTOM PROCEDURE TRAY) ×2 IMPLANT
PAD ARMBOARD 7.5X6 YLW CONV (MISCELLANEOUS) ×4 IMPLANT
PLATE LT PROX FEMUR 12H (Plate) ×1 IMPLANT
SCREW CORTICAL NCB 5.0X40 (Screw) ×3 IMPLANT
SCREW NCB 4.0MX55M (Screw) ×1 IMPLANT
SCREW NCB 4.0MX65M (Screw) ×2 IMPLANT
SCREW NCB 4.0X40MM (Screw) ×1 IMPLANT
SCREW UNICORT 5.0X18MM (Screw) ×2 IMPLANT
SCREW UNICORTICAL 5.0X18 (Screw) IMPLANT
SPONGE LAP 18X18 RF (DISPOSABLE) ×2 IMPLANT
STAPLER VISISTAT 35W (STAPLE) ×1 IMPLANT
SUT ETHILON 3 0 PS 1 (SUTURE) ×2 IMPLANT
SUT MNCRL AB 3-0 PS2 18 (SUTURE) ×1 IMPLANT
SUT MON AB 2-0 CT1 36 (SUTURE) ×1 IMPLANT
SUT PROLENE 0 CT (SUTURE) IMPLANT
SUT VIC AB 0 CT1 27 (SUTURE)
SUT VIC AB 0 CT1 27XBRD ANBCTR (SUTURE) ×1 IMPLANT
SUT VIC AB 1 CT1 27 (SUTURE) ×6
SUT VIC AB 1 CT1 27XBRD ANBCTR (SUTURE) IMPLANT
SUT VIC AB 1 CT1 27XBRD ANTBC (SUTURE) ×4 IMPLANT
SUT VIC AB 2-0 CT1 27 (SUTURE)
SUT VIC AB 2-0 CT1 TAPERPNT 27 (SUTURE) ×1 IMPLANT
TOWEL GREEN STERILE (TOWEL DISPOSABLE) ×4 IMPLANT
TOWEL GREEN STERILE FF (TOWEL DISPOSABLE) ×2 IMPLANT
TRAY FOLEY MTR SLVR 16FR STAT (SET/KITS/TRAYS/PACK) IMPLANT
TUBE CONNECTING 12X1/4 (SUCTIONS) ×1 IMPLANT
WATER STERILE IRR 1000ML POUR (IV SOLUTION) ×3 IMPLANT
YANKAUER SUCT BULB TIP NO VENT (SUCTIONS) ×1 IMPLANT

## 2020-04-19 NOTE — Progress Notes (Signed)
PT Cancellation Note  Patient Details Name: [name] Small MRN: [id number] DOB: 01/01/1928   Cancelled Treatment:    Reason Eval/Treat Not Completed: Active bedrest order (per chart review, tentatively planned surgery this afternoon).    Wyona Almas, PT, DPT Acute Rehabilitation Services Pager [phone] Office [phone]    Deno Etienne 04/19/2020, 8:17 AM

## 2020-04-19 NOTE — Op Note (Signed)
Orthopaedic Surgery Operative Note (CSN: [id number] ) Date of Surgery: 04/19/2020  Admit Date: 04/18/2020   Diagnoses: Pre-Op Diagnoses: Left periprosthetic proximal femur fracture  Post-Op Diagnosis: Same  Procedures: CPT 27244-Open reduction internal fixation of left proximal femur fracture  Surgeons : Primary: Shona Needles, MD  Assistant: Patrecia Pace, PA-C  Location: OR 6   Anesthesia:General  Antibiotics: Ancef 2g preop with 1 gm vancomycin powder placed topically   Tourniquet time:None  Estimated Blood VHQI:696 mL  Complications:None   Specimens:None   Implants: Implant Name Type Inv. Item Serial No. Manufacturer Lot No. LRB No. Used Action  CABLE CERLAGE W/CRIMP 1.8MM - [id number] Cable CABLE CERLAGE W/CRIMP 1.8MM  [employer](ORTH,TRAU,BIO,SG) [id number] Left 1 Implanted  CABLE CERLAGE W/CRIMP 1.8MM - [id number] Cable CABLE CERLAGE W/CRIMP 1.8MM  [employer](ORTH,TRAU,BIO,SG) [id number] Left 1 Implanted  PLATE LT PROX FEMUR 87F - [id number] Plate PLATE LT PROX FEMUR 84Z  [employer](ORTH,TRAU,BIO,SG)  Left 1 Implanted  SCREW NCB 4.0MX65M - [id number] Screw SCREW NCB 4.0MX65M  [employer](ORTH,TRAU,BIO,SG)  Left 2 Implanted  SCREW NCB 4.1UX32T - [id number] Screw SCREW NCB 4.4YH06C  [employer](ORTH,TRAU,BIO,SG)  Left 1 Implanted  SCREW CORTICAL NCB 5.0X40 - [id number] Screw SCREW CORTICAL NCB 5.0X40  [employer](ORTH,TRAU,BIO,SG)  Left 3 Implanted  SCREW NCB 4.0X40MM - [id number] Screw SCREW NCB 4.0X40MM  [employer](ORTH,TRAU,BIO,SG)  Left 1 Implanted  CAP LOCK NCB - [id number] Cap CAP LOCK NCB  [employer](ORTH,TRAU,BIO,SG)  Left 7 Implanted  SCREW UNICORT 5.0X18MM - [id number] Screw SCREW UNICORT 5.0X18MM  [employer](ORTH,TRAU,BIO,SG)  Left 1 Implanted     Indications for Surgery: 86 year old male who sustained a ground-level fall and had a left periprosthetic proximal femur fracture.  Due to the unstable nature of his injury I recommended proceeding with open  reduction internal fixation.  Risks and benefits were discussed with the patient and his family.  Risks included but not limited to bleeding, infection, malunion, nonunion, hardware failure, hardware irritation, nerve or blood vessel injury, DVT, need for revision total hip arthroplasty, even the possibility anesthetic complications.  They agreed to proceed with surgery and consent was obtained.  Operative Findings: Open reduction internal fixation of left periprosthetic proximal femur fracture using [employer] cables for provisional reduction and a NCB proximal femoral locking plate  Procedure: The patient was identified in the preoperative holding area. Consent was confirmed with the patient and their family and all questions were answered. The operative extremity was marked after confirmation with the patient. he was then brought back to the operating room by our anesthesia colleagues.  He was placed under general anesthetic and carefully transferred over to a radiolucent flat top table.  A bump was placed under his operative hip.  The left lower extremity was then prepped and draped in usual sterile fashion.  A timeout was performed to verify the patient, the procedure, and the extremity.  Preoperative antibiotics were dosed.  Fluoroscopic imaging was obtained showing the unstable nature of his injury.  A direct lateral approach to the proximal femur was carried down through skin and subcutaneous tissue.  I incised the IT band in line with my incision.  I then identified the vastus lateralis then released the vastus off of the posterior aspect of the femur.  At the proximal portion were it inserted on the greater trochanter I created a vertical limb to be able to expose the lateral femur to be able to place the plate.  I extended the incision distally until I was able to visualize and  access the entirety of the fracture.  The fracture was exposed and hematoma was removed.  I then used a reduction  tenaculum to reduce the main fracture components of the distal femoral shaft and the proximal fragment that was still ingrown to the stem.  I was able to get near anatomic reduction of this fracture.  I then passed 2 cables 1 proximal and 1 distal to the clamp.  I tightened these down to provisionally hold the reduction of the fracture.  The clamp was then removed.  I confirmed adequate reduction with fluoroscopy.  I then chose a 12 hole NCB proximal femoral locking plate.  I attached this to the targeting arm and then positioned appropriately on the lateral aspect of the femur.  I held it provisionally with a K wire proximally.  I used a percutaneously placed 3.3 mm drill bit to align the distal portion of the plate to bone.  Once I confirmed positioning of the plate I then placed 4.0 mm screws anterior and posterior to the stem in the intertrochanteric region.  Another screw was placed posterior to the stem.  Excellent purchase was obtained.  I then placed a unicortical screw in the proximal segment as I was not able to direct one of the screw holes around the stem.  Using the targeting arm I placed a three 5.0 millimeter screws into the femoral shaft.  The most distal screw I retained as a 4.0 millimeter screw to try to decrease a stress riser.  Locking caps were placed on all the 5.0 millimeter screws into the femoral shaft.  Locking caps were placed on all the proximal screws.  Final fluoroscopic imaging was obtained.  The incision was copiously irrigated.  A gram of vancomycin powder was placed in the incision.  The vastus lateralis was repaired back down to the greater trochanter and to the intermuscular septum.  The IT band was closed with a #1 Vicryl suture.  Layered closure of Scarpa's fascia and skin was performed with 2-0 Vicryl and 3-0 nylon.  The percutaneous incisions were closed with 3-0 nylon.  A sterile dressing was placed.  The patient was then awoken from anesthesia and taken to the PACU in  stable condition.  Post Op Plan/Instructions: Patient will be touchdown weightbearing to left lower extremity.  He will receive postoperative Ancef.  He will be placed on Lovenox for DVT prophylaxis.  We will have him mobilize with physical and Occupational Therapy.   I was present and performed the entire surgery.  Patrecia Pace, PA-C did assist me throughout the case. An assistant was necessary given the difficulty in approach, maintenance of reduction and ability to instrument the fracture.   Katha Hamming, MD [facility]

## 2020-04-19 NOTE — Consult Note (Signed)
Orthopaedic Trauma Service (OTS) Consult   Patient ID: [name] Small MRN: [id number] DOB/AGE: [birth year] 86 y.o.  Reason for Consult:Left periprosthetic femur fracture Referring Physician: Dr. Jean Rosenthal, MD [name] Small  HPI: [name] Small is an 86 y.o. male who is being seen in consultation at the request of Dr. Ninfa Linden for evaluation of left femur fracture.  The patient tripped and fell in a parking lot landed on his left hip had immediate pain and deformity.  X-ray showed a periprosthetic left proximal femur fracture.  Due to the complexity of his injury it was recommended that orthopedic trauma take over care.  Patient had a previous total hip arthroplasty with Dr. Ninfa Linden.  After discussion with Dr. Ninfa Linden it was felt that the stem was stable and would likely be able to undergo repair with open reduction internal fixation.  Patient was seen at bedside this morning with his wife and daughter.  Patient ambulates with a walker at baseline.  Was not having any problems or pain previous to his fall yesterday.  He was placed in Buck's traction overnight and this is caused his hip to be much more comfortable.  He has a previous history of a transmetatarsal amputation on that side.  Has had no wound healing problems and no active wounds currently.  He lives at home with his wife.  Past Medical History:  Diagnosis Date  . (HFpEF) heart failure with preserved ejection fraction ([place])   . Arthritis   . Back pain    s/p lumbar injection 2014  . BENIGN PROSTATIC HYPERTROPHY 05/21/2007  . Bladder cancer ([place]) 10/2011  . CAD (coronary artery disease)    nonobstructive by cath 6/12:  mid LAD 30%, proximal obtuse marginal-2 30%, proximal RCA 20%, mid RCA 30-40%.  He had normal cardiac output and mildly elevated filling pressures but no significant pulmonary hypertension;   Echocardiogram in May 2012 demonstrated EF 50-55% and left atrial enlargement   . Cellulitis of left foot     Hospitalized in 2006  . Chronic kidney disease (CKD), stage III (moderate) 12/04/2007   FOLLOWED BY DR PATEL  . Chronic pain of lower extremity   . Complication of anesthesia    1 time bladder cancer surgery- 2012 , oxygen satruratioon dropped had to stay overnight, no problems since then.  Marland Kitchen COPD (chronic obstructive pulmonary disease) ([place])   . DIABETES MELLITUS, TYPE II 05/21/2007  . DIVERTICULOSIS, COLON 05/21/2007   pt denies  . Dyspnea    with exertion, patient mointors saturation  . Fatigue AGE-RELATED  . Gross hematuria    pt denies  . HYPERTENSION 05/21/2007  . Impaired hearing BILATERAL HEARING AIDS  . On home oxygen therapy    "2L at nighttime" (02/28/2016)  . PAD (peripheral artery disease) ([place])    a. 02/2016 L foot nonhealing ulcer-->Periph angio: L pop 100 w/ reconstitution via extensive vollats to the prox-mid peroneal (only patent vessel BK)-->PTA of L Pop and peroneal; b. Ongoing LE ischemia 5 & 06/2016 req amputation of toes on L foot.  . Pneumonia    Hx  of   . Psoriasis ELBOWS  . Psoriasis   . PSORIASIS, SCALP 10/25/2008    Past Surgical History:  Procedure Laterality Date  . AMPUTATION Left 03/27/2016   Procedure: partial first AMPUTATION RAY; LEFT;  Surgeon: Trula Slade, DPM;  Location: [place];  Service: Podiatry;  Laterality: Left;  . AMPUTATION Left 06/12/2016   Procedure: TRANSMETATARSAL AMPUTATION LEFT FOOT;  Surgeon: Newt Minion, MD;  Location: [place];  Service: Orthopedics;  Laterality: Left;  . APPENDECTOMY  1941  . CARDIAC CATHETERIZATION  04-24-11/  DR Rogue Jury ARIDA   MILD NONOBSTRUCTIVE CAD, NORMA CARDIAC OUTPUT  . CARDIOVASCULAR STRESS TEST  2007  . CATARACT EXTRACTION W/ INTRAOCULAR LENS  IMPLANT, BILATERAL Bilateral ~ 2010  . CYSTOSCOPY  12/25/2011   Procedure: CYSTOSCOPY;  Surgeon: Molli Hazard, MD;  Location: [facility];  Service: Urology;  Laterality: N/A;  needs intubation and to be paralyzed   . CYSTOSCOPY W/  RETROGRADES  10/30/2011   Procedure: CYSTOSCOPY WITH RETROGRADE PYELOGRAM;  Surgeon: Molli Hazard, MD;  Location: [facility];  Service: Urology;  Laterality: Bilateral;  CYSTOSCOPY POSS TURBT BILATERAL RETROGRADE PYLEOGRAM   . CYSTOSCOPY W/ RETROGRADES  11/30/2012   Procedure: CYSTOSCOPY WITH RETROGRADE PYELOGRAM;  Surgeon: Molli Hazard, MD;  Location: [facility];  Service: Urology;  Laterality: Bilateral;  Flexible cystoscopy.  . CYSTOSCOPY WITH BIOPSY  11/30/2012   Procedure: CYSTOSCOPY WITH BIOPSY;  Surgeon: Molli Hazard, MD;  Location: [facility];  Service: Urology;  Laterality: N/A;  . INCISION AND DRAINAGE FOOT Left ~ 2005   LEFT FOOT DUE TO INFECTION FROM  NAIL PUNCTURE INJURY  . LUMBAR LAMINECTOMY/DECOMPRESSION MICRODISCECTOMY N/A 10/18/2016   Procedure: LEFT AND CENTRAL L4-5 LUMBAR LAMINECTOMY WITH RESECTION OF SYNOVIAL CYST;  Surgeon: Jessy Oto, MD;  Location: [place];  Service: Orthopedics;  Laterality: N/A;  . PENILE PROSTHESIS IMPLANT  1990s  . PERIPHERAL VASCULAR CATHETERIZATION N/A 02/14/2016   Procedure: Abdominal Aortogram w/Lower Extremity;  Surgeon: Wellington Hampshire, MD;  Location: [place] [facility];  Service: Cardiovascular;  Laterality: N/A;  . PERIPHERAL VASCULAR CATHETERIZATION Left 02/28/2016   Procedure: Peripheral Vascular Balloon Angioplasty;  Surgeon: Wellington Hampshire, MD;  Location: [place] [facility];  Service: Cardiovascular;  Laterality: Left;  left peroneal and popliteal artery  . [place]  . TOTAL HIP ARTHROPLASTY Left 12/27/2016   Procedure: LEFT TOTAL HIP ARTHROPLASTY ANTERIOR APPROACH;  Surgeon: Mcarthur Rossetti, MD;  Location: [facility];  Service: Orthopedics;  Laterality: Left;  . TRANSURETHRAL RESECTION OF BLADDER TUMOR  10/30/2011   Procedure: TRANSURETHRAL RESECTION OF BLADDER TUMOR (TURBT);  Surgeon: Molli Hazard, MD;  Location: [facility];  Service: Urology;  Laterality: N/A;  . TRANSURETHRAL RESECTION OF BLADDER TUMOR  12/25/2011   Procedure: TRANSURETHRAL RESECTION OF BLADDER TUMOR (TURBT);  Surgeon: Molli Hazard, MD;  Location: [facility];  Service: Urology;  Laterality: N/A;  need long gyrus instruments   . VASECTOMY  1990s    Family History  Problem Relation Age of Onset  . Diabetes Mother   . Heart disease Brother   . Heart disease Brother   . Heart disease Brother   . Heart disease Brother   . Heart disease Brother   . Heart disease Brother   . Lung cancer Brother     Social History:  reports that he quit smoking about 51 years ago. His smoking use included cigarettes. He has a 44.00 pack-year smoking history. He has never used smokeless tobacco. He reports that he does not drink alcohol or use drugs.  Allergies:  Allergies  Allergen Reactions  . Actos [Pioglitazone Hydrochloride] Other (See Comments)    Held 2012 bladder cancer  . Aspirin Other (See Comments)    Held 2012 due to hematuria and bruising.   . Ace Inhibitors Cough  .  Bactrim Rash and Other (See Comments)    Rash, presumed allergy  . Gabapentin Nausea And Vomiting    Upset stomach and diarrhea - tolerates low doses  . Lyrica [Pregabalin] Swelling    swelling  . Pravastatin Swelling    Swelling of feet  . Sulfa Drugs Cross Reactors Rash    Medications:  No current facility-administered medications on file prior to encounter.   Current Outpatient Medications on File Prior to Encounter  Medication Sig Dispense Refill  . acetaminophen (TYLENOL) 500 MG tablet Take 1,000 mg by mouth every 8 (eight) hours as needed for mild pain, moderate pain or headache.    Marland Kitchen amLODipine (NORVASC) 2.5 MG tablet Take 1 tablet (2.5 mg total) by mouth daily. 90 tablet 3  . Apremilast 30 MG TABS Take 1 tablet by mouth daily.    Marland Kitchen atorvastatin (LIPITOR) 10 MG tablet TAKE 1 TABLET BY MOUTH  DAILY (Patient taking  differently: Take 10 mg by mouth daily. ) 90 tablet 3  . budesonide-formoterol (SYMBICORT) 160-4.5 MCG/ACT inhaler Inhale 2 puffs into the lungs 2 (two) times daily as needed (shortness of breath).     . clobetasol (TEMOVATE) 0.05 % external solution Apply 1 application topically daily as needed (rash).     . clopidogrel (PLAVIX) 75 MG tablet TAKE 1 TABLET BY MOUTH  DAILY (Patient taking differently: Take 75 mg by mouth daily. ) 90 tablet 3  . furosemide (LASIX) 40 MG tablet TAKE 1 TABLET BY MOUTH  EVERY MONDAY, WEDNESDAY,  AND FRIDAY (Patient taking differently: Take 40 mg by mouth every Monday, Wednesday, and Friday. ) 39 tablet 3  . glipiZIDE (GLUCOTROL XL) 5 MG 24 hr tablet TAKE 1 TABLET BY MOUTH  DAILY WITH BREAKFAST (Patient taking differently: Take 5 mg by mouth daily with breakfast. ) 90 tablet 3  . insulin glargine (LANTUS) 100 UNIT/ML injection Inject 0.15 mLs (15 Units total) into the skin daily. (Patient taking differently: Inject 15 Units into the skin at bedtime. ) 10 mL   . nystatin (MYCOSTATIN/NYSTOP) powder Apply to affected area twice a day (Patient taking differently: Apply 1 application topically 2 (two) times daily as needed (fungal infections). ) 30 g 0  . tiotropium (SPIRIVA) 18 MCG inhalation capsule Place 1 capsule (18 mcg total) into inhaler and inhale daily. 30 capsule 12  . escitalopram (LEXAPRO) 5 MG tablet Take 1 tablet (5 mg total) by mouth daily. (Patient not taking: Reported on 01/31/2020) 90 tablet 1    ROS: Constitutional: No fever or chills Vision: No changes in vision ENT: No difficulty swallowing CV: No chest pain Pulm: No SOB or wheezing GI: No nausea or vomiting GU: No urgency or inability to hold urine Skin: No poor wound healing Neurologic: No numbness or tingling Psychiatric: No depression or anxiety Heme: No bruising Allergic: No reaction to medications or food   Exam: Blood pressure (!) 117/56, pulse 66, temperature 98 F (36.7 C), temperature  source Oral, resp. rate 16, height 6\' 2"  (1.88 m), weight 94.9 kg, SpO2 95 %. General: No acute distress Orientation: He is awake alert and oriented Mood and Affect: Cooperative and pleasant Gait: Unable to assess due to his fracture Coordination and balance: Within normal limits  Left lower extremity: No skin lesions and no traumatic wounds.  No ecchymosis or rash.  There is some swelling and shortening of the extremity.  He has a status post transmetatarsal amputation.  Does not have any knee effusion and no deformity about his knee or  ankle.  He has a Buck's traction in place.  He has active dorsiflexion plantarflexion of his foot and ankle.  Diminished pulses to the right side.  Right lower extremity: Skin without lesions. No tenderness to palpation. Full painless ROM, full strength in each muscle groups without evidence of instability.   Medical Decision Making: Data: Imaging: X-rays were reviewed which shows a comminuted proximal femur fracture without any apparent subsidence of the femoral stem.  Labs:  Results for orders placed or performed during the hospital encounter of 04/18/20 (from the past 24 hour(s))  Glucose, capillary     Status: Abnormal   Collection Time: 04/18/20  8:18 PM  Result Value Ref Range   Glucose-Capillary 221 (H) 70 - 99 mg/dL  Hemoglobin A1c     Status: Abnormal   Collection Time: 04/19/20  4:06 AM  Result Value Ref Range   Hgb A1c MFr Bld 9.3 (H) 4.8 - 5.6 %   Mean Plasma Glucose 220.21 mg/dL  MRSA PCR Screening     Status: None   Collection Time: 04/19/20  5:26 AM   Specimen: Nasal Mucosa; Nasopharyngeal  Result Value Ref Range   MRSA by PCR NEGATIVE NEGATIVE  Glucose, capillary     Status: Abnormal   Collection Time: 04/19/20  7:40 AM  Result Value Ref Range   Glucose-Capillary 223 (H) 70 - 99 mg/dL  Glucose, capillary     Status: Abnormal   Collection Time: 04/19/20 12:30 PM  Result Value Ref Range   Glucose-Capillary 159 (H) 70 - 99 mg/dL     Imaging or Labs ordered: None  Medical history and chart was reviewed and case discussed with medical provider.  Assessment/Plan: 86 year old male with multiple medical problems including a congestive heart failure, coronary artery disease, chronic kidney disease, COPD and diabetes with left periprosthetic proximal femur fracture  Patient will need to undergo formal open reduction internal fixation.  Risks and benefits were discussed with the patient and his family.  Risks included but not limited to bleeding, infection, malunion, nonunion, hardware failure, hardware irritation, nerve and blood vessel injury, DVT, even the possibility anesthetic complications.  They agreed to proceed with surgery and consent was obtained.  Shona Needles, MD [facility] [phone] (office) [email or web address]

## 2020-04-19 NOTE — Progress Notes (Signed)
Patient ID: [name] Small, male   DOB: 1928/05/26, 86 y.o.   MRN: [id number] The patient is comfortable this morning in Buck's traction on his left lower extremity.  His wife is at the bedside as well.  I have consulted Dr. Lennette Bihari Haddix with orthopedic trauma for his expertise dealing and complex fracture such as this left periprosthetic femur fracture.  The patient is n.p.o. and surgery is tentatively scheduled for this afternoon.

## 2020-04-19 NOTE — Transfer of Care (Signed)
Immediate Anesthesia Transfer of Care Note  Patient: [name]  Procedure(s) Performed: OPEN REDUCTION INTERNAL FIXATION FEMORAL SHAFT FRACTURE (Left Leg Upper)  Patient Location: PACU  Anesthesia Type:General  Level of Consciousness: awake, alert  and oriented  Airway & Oxygen Therapy: Patient Spontanous Breathing and Patient connected to nasal cannula oxygen  Post-op Assessment: Report given to RN and Post -op Vital signs reviewed and stable  Post vital signs: Reviewed and stable  Last Vitals:  Vitals Value Taken Time  BP 102/42 04/19/20 1755  Temp    Pulse 73 04/19/20 1757  Resp 21 04/19/20 1757  SpO2 93 % 04/19/20 1757  Vitals shown include unvalidated device data.  Last Pain:  Vitals:   04/19/20 1422  TempSrc:   PainSc: 0-No pain         Complications: No apparent anesthesia complications

## 2020-04-19 NOTE — Anesthesia Preprocedure Evaluation (Addendum)
Anesthesia Evaluation  Patient identified by MRN, date of birth, ID band Patient awake    Reviewed: Allergy & Precautions, NPO status , Patient's Chart, lab work & pertinent test results  History of Anesthesia Complications Negative for: history of anesthetic complications  Airway Mallampati: I  TM Distance: >3 FB Neck ROM: Full    Dental  (+) Edentulous Upper, Edentulous Lower   Pulmonary COPD,  oxygen dependent, former smoker,  04/18/2020 SARS coronavirus NEG   breath sounds clear to auscultation       Cardiovascular hypertension, Pt. on medications (-) angina+ CAD ( non obstructive by '12 cath) and + Peripheral Vascular Disease  + dysrhythmias Atrial Fibrillation  Rhythm:Regular Rate:Normal  '15 ECHO: EF 50-55%, valves OK   Neuro/Psych Anxiety Hard of hearing    GI/Hepatic negative GI ROS, Neg liver ROS,   Endo/Other  diabetes (glu 109), Oral Hypoglycemic Agents, Insulin Dependent  Renal/GU Renal InsufficiencyRenal disease (creat 1.93)     Musculoskeletal   Abdominal   Peds  Hematology negative hematology ROS (+) plavix   Anesthesia Other Findings   Reproductive/Obstetrics                           Anesthesia Physical Anesthesia Plan  ASA: III  Anesthesia Plan: General   Post-op Pain Management:    Induction: Intravenous  PONV Risk Score and Plan: 2 and Ondansetron and Dexamethasone  Airway Management Planned: Oral ETT  Additional Equipment:   Intra-op Plan:   Post-operative Plan: Extubation in OR  Informed Consent: I have reviewed the patients History and Physical, chart, labs and discussed the procedure including the risks, benefits and alternatives for the proposed anesthesia with the patient or authorized representative who has indicated his/her understanding and acceptance.       Plan Discussed with: CRNA and Surgeon  Anesthesia Plan Comments:          Anesthesia Quick Evaluation

## 2020-04-19 NOTE — Progress Notes (Signed)
OT Cancellation Note  Patient Details Name: [name] Small MRN: [id number] DOB: 1928-01-01   Cancelled Treatment:    Reason Eval/Treat Not Completed: Medical issues which prohibited therapy. Active bedrest order.  Tyrone Schimke, OT Acute Rehabilitation Services Pager: [phone] Office: [phone]  04/19/2020, 9:01 AM

## 2020-04-19 NOTE — Anesthesia Procedure Notes (Signed)
Procedure Name: Intubation Date/Time: 04/19/2020 3:14 PM Performed by: Colin Benton, CRNA Pre-anesthesia Checklist: Patient identified, Emergency Drugs available, Suction available and Patient being monitored Patient Re-evaluated:Patient Re-evaluated prior to induction Oxygen Delivery Method: Circle system utilized Preoxygenation: Pre-oxygenation with 100% oxygen Induction Type: IV induction Ventilation: Mask ventilation without difficulty and Oral airway inserted - appropriate to patient size Laryngoscope Size: Miller and 3 Grade View: Grade I Tube type: Oral Tube size: 7.5 mm Number of attempts: 1 Airway Equipment and Method: Stylet Placement Confirmation: ETT inserted through vocal cords under direct vision,  positive ETCO2 and breath sounds checked- equal and bilateral Secured at: 23 cm Tube secured with: Tape Dental Injury: Teeth and Oropharynx as per pre-operative assessment

## 2020-04-19 NOTE — Plan of Care (Signed)
  Problem: Education: Goal: Verbalization of understanding the information provided (i.e., activity precautions, restrictions, etc) will improve Outcome: Progressing Goal: Individualized Educational Video(s) Outcome: Progressing   Problem: Activity: Goal: Ability to ambulate and perform ADLs will improve Outcome: Progressing   Problem: Clinical Measurements: Goal: Postoperative complications will be avoided or minimized Outcome: Progressing   Problem: Pain Management: Goal: Pain level will decrease Outcome: Progressing   

## 2020-04-19 NOTE — Progress Notes (Signed)
[place]s yellow while pt is off unit in PACU, will recheck vitals upon arrival to unit

## 2020-04-19 NOTE — Anesthesia Postprocedure Evaluation (Signed)
Anesthesia Post Note  Patient: [name] Small  Procedure(s) Performed: OPEN REDUCTION INTERNAL FIXATION FEMORAL SHAFT FRACTURE (Left Leg Upper)     Patient location during evaluation: PACU Anesthesia Type: General Level of consciousness: awake and alert Pain management: pain level controlled Vital Signs Assessment: post-procedure vital signs reviewed and stable Respiratory status: spontaneous breathing, nonlabored ventilation and respiratory function stable Cardiovascular status: blood pressure returned to baseline and stable Postop Assessment: no apparent nausea or vomiting Anesthetic complications: no    Last Vitals:  Vitals:   04/19/20 1808 04/19/20 1823  BP: (!) 103/52 (!) 102/57  Pulse: 76 77  Resp: (!) 21 12  Temp:    SpO2: 93% 93%    Last Pain:  Vitals:   04/19/20 1755  TempSrc:   PainSc: 0-No pain                 [name]

## 2020-04-19 NOTE — Progress Notes (Signed)
[facility]  PROGRESS NOTE  [name] Small VOZ:[id number] DOB: 12/22/1927 DOA: 04/18/2020 PCP: Tonia Ghent, MD   Brief HPI:   86 year old male with medical history of nonobstructive CAD, hypertension, hyperlipidemia, diverticulosis, non-insulin-dependent diabetes type 2, COPD on room air, CKD stage IIIb, heart failure with preserved ejection fraction, arthritis status post left femur fracture and ORIF 2018 presented from home after mechanical fall stepping off a curb and missing his step, he fell onto his left side with left hip pain and rib pain.  Patient denied any syncope, presyncope, headache. In the ED imaging was remarkable for periprosthetic fracture, orthopedics was consulted and recommended transfer to [facility] for surgical repair.    Subjective   Patient seen and examined, has been seen by orthopedic surgery.  He has periprosthetic hip fracture and plan for ORIF today.   Assessment/Plan:     1. Mechanical fall-with acute obliquely oriented periprosthetic fracture-involving left proximal femur-Ortho was consulted, morphine as needed for pain.  Plan for surgery today. 2. Hypertension-continue home medication daily amlodipine, furosemide is on hold due to poor p.o. intake. 3. CKD stage IIIb-creatinine is 1.93 better than yesterday, follow BMP in a.m. 4. Hyperlipidemia-continue atorvastatin 5. Diabetes mellitus type 2-hold p.o. medications, Lantus.  Continue sliding scale insulin with NovoLog.  Consider restarting Lantus after surgery. 6. COPD-not in exacerbation, continue Breo      Lab Results  Component Value Date   [place] NEGATIVE 04/18/2020     CBG: Recent Labs  Lab 04/18/20 2018 04/19/20 0740  GLUCAP 221* 223*    CBC: Recent Labs  Lab 04/18/20 1136  WBC 8.0  NEUTROABS 5.6  HGB 13.9  HCT 43.0  MCV 101.4*  PLT 425    Basic Metabolic Panel: Recent Labs  Lab 04/18/20 1136  NA 138  K 5.1  CL 98  CO2 29  GLUCOSE 271*  BUN 32*   CREATININE 1.93*  CALCIUM 8.4*        DVT prophylaxis: Heparin  Code Status: Full code  Family Communication: No family at bedside    Status is: Inpatient  Dispo: The patient is from: Home              Anticipated d/c is to: Skilled nursing facility              Anticipated d/c date is: 04/21/2020              Patient currently not medically stable for discharge  Barrier to discharge-periprosthetic fracture, will require ORIF today        Scheduled medications:  . amLODipine  2.5 mg Oral Daily  . Apremilast  1 tablet Oral Daily  . atorvastatin  10 mg Oral Daily  . Chlorhexidine Gluconate Cloth  6 each Topical Daily  . escitalopram  5 mg Oral Daily  . fluticasone furoate-vilanterol  1 puff Inhalation Daily  . heparin  5,000 Units Subcutaneous Q8H  . insulin aspart  0-5 Units Subcutaneous QHS  . insulin aspart  0-9 Units Subcutaneous TID WC  . senna  1 tablet Oral BID  . umeclidinium bromide  1 puff Inhalation Daily    Consultants:  Orthopedics  Procedures:    Antibiotics:   Anti-infectives (From admission, onward)   None       Objective   Vitals:   04/19/20 0336 04/19/20 0801 04/19/20 0816 04/19/20 0904  BP: (!) 129/59 (!) 117/56 (!) 117/56   Pulse: 68 67 67 66  Resp: 17 15 15 16   Temp:  98 F (36.7 C) 98 F (36.7 C) 98 F (36.7 C)   TempSrc: Oral Oral Oral   SpO2: 97% 96%  95%  Weight:   94.9 kg   Height:   6\' 2"  (1.88 m)     Intake/Output Summary (Last 24 hours) at 04/19/2020 1142 Last data filed at 04/19/2020 0900 Gross per 24 hour  Intake 0 ml  Output --  Net 0 ml    No intake/output data recorded.  Filed Weights   04/19/20 0816  Weight: 94.9 kg    Physical Examination:    General: Appears in no acute distress  Cardiovascular: S1-S2, regular  Respiratory: Clear to auscultation bilaterally  Abdomen: Abdomen is soft, nontender, no organomegaly  Extremities: No edema in the lower extremities, left foot  transmetatarsal amputation  Neurologic: Alert, oriented x3, no focal deficit noted    Data Reviewed:   Recent Results (from the past 240 hour(s))  Resp Panel by RT PCR (RSV, Flu A&B, Covid) - Nasopharyngeal Swab     Status: None   Collection Time: 04/18/20 11:36 AM   Specimen: Nasopharyngeal Swab  Result Value Ref Range Status   SARS Coronavirus 2 by RT PCR NEGATIVE NEGATIVE Final    Comment: (NOTE) SARS-CoV-2 target nucleic acids are NOT DETECTED. The SARS-CoV-2 RNA is generally detectable in upper respiratoy specimens during the acute phase of infection. The lowest concentration of SARS-CoV-2 viral copies this assay can detect is 131 copies/mL. A negative result does not preclude SARS-Cov-2 infection and should not be used as the sole basis for treatment or other patient management decisions. A negative result may occur with  improper specimen collection/handling, submission of specimen other than nasopharyngeal swab, presence of viral mutation(s) within the areas targeted by this assay, and inadequate number of viral copies (<131 copies/mL). A negative result must be combined with clinical observations, patient history, and epidemiological information. The expected result is Negative. Fact Sheet for Patients:  PinkCheek.be Fact Sheet for Healthcare Providers:  GravelBags.it This test is not yet ap proved or cleared by the [place] FDA and  has been authorized for detection and/or diagnosis of SARS-CoV-2 by FDA under an Emergency Use Authorization (EUA). This EUA will remain  in effect (meaning this test can be used) for the duration of the COVID-19 declaration under Section 564(b)(1) of the Act, 21 U.S.C. section 360bbb-3(b)(1), unless the authorization is terminated or revoked sooner.    Influenza A by PCR NEGATIVE NEGATIVE Final   Influenza B by PCR NEGATIVE NEGATIVE Final    Comment: (NOTE) The Xpert Xpress  SARS-CoV-2/FLU/RSV assay is intended as an aid in  the diagnosis of influenza from Nasopharyngeal swab specimens and  should not be used as a sole basis for treatment. Nasal washings and  aspirates are unacceptable for Xpert Xpress SARS-CoV-2/FLU/RSV  testing. Fact Sheet for Patients: PinkCheek.be Fact Sheet for Healthcare Providers: GravelBags.it This test is not yet approved or cleared by the [place] FDA and  has been authorized for detection and/or diagnosis of SARS-CoV-2 by  FDA under an Emergency Use Authorization (EUA). This EUA will remain  in effect (meaning this test can be used) for the duration of the  Covid-19 declaration under Section 564(b)(1) of the Act, 21  U.S.C. section 360bbb-3(b)(1), unless the authorization is  terminated or revoked.    Respiratory Syncytial Virus by PCR NEGATIVE NEGATIVE Final    Comment: (NOTE) Fact Sheet for Patients: PinkCheek.be Fact Sheet for Healthcare Providers: GravelBags.it This test is not yet approved or  cleared by the [place] and  has been authorized for detection and/or diagnosis of SARS-CoV-2 by  FDA under an Emergency Use Authorization (EUA). This EUA will remain  in effect (meaning this test can be used) for the duration of the  COVID-19 declaration under Section 564(b)(1) of the Act, 21 U.S.C.  section 360bbb-3(b)(1), unless the authorization is terminated or  revoked. Performed at [facility], [place] [address] [place], [place] [address]   MRSA PCR Screening     Status: None   Collection Time: 04/19/20  5:26 AM   Specimen: Nasal Mucosa; Nasopharyngeal  Result Value Ref Range Status   MRSA by PCR NEGATIVE NEGATIVE Final    Comment:        The GeneXpert MRSA Assay (FDA approved for NASAL specimens only), is one component of a comprehensive MRSA colonization surveillance  program. It is not intended to diagnose MRSA infection nor to guide or monitor treatment for MRSA infections. Performed at [facility], [place] [address] [place], [place] [address]     No results for input(s): LIPASE, AMYLASE in the last 168 hours. No results for input(s): AMMONIA in the last 168 hours.  Cardiac Enzymes: No results for input(s): CKTOTAL, CKMB, CKMBINDEX, TROPONINI in the last 168 hours. BNP (last 3 results) No results for input(s): BNP in the last 8760 hours.  ProBNP (last 3 results) No results for input(s): PROBNP in the last 8760 hours.  Studies:  DG Chest 1 View  Result Date: 04/18/2020 CLINICAL DATA:  Pain following fall EXAM: CHEST  1 VIEW COMPARISON:  September 25, 2018 FINDINGS: Lungs are clear. Heart size and pulmonary vascularity are normal. No adenopathy. There is aortic atherosclerosis. No pneumothorax. No fracture noted in the chest region. IMPRESSION: Lungs clear. Cardiac silhouette normal. Aortic Atherosclerosis (ICD10-I70.0). Electronically Signed   By: Lowella Grip III M.D.   On: 04/18/2020 13:44   DG Pelvis Portable  Result Date: 04/18/2020 CLINICAL DATA:  Left hip pain after fall EXAM: PORTABLE PELVIS 1-2 VIEWS COMPARISON:  12/27/2016 FINDINGS: There is an obliquely oriented periprosthetic fracture deformity involving the proximal left femur. Fracture line extends into the greater trochanter. The fracture fragments are in near anatomic alignment. Moderate to advanced right hip osteoarthritis. Penile prosthesis again noted IMPRESSION: Acute obliquely oriented periprosthetic fracture deformity involves the proximal left femur. Electronically Signed   By: Kerby Moors M.D.   On: 04/18/2020 11:37   DG Hip Unilat W or Wo Pelvis 2-3 Views Left  Result Date: 04/18/2020 CLINICAL DATA:  Pain following fall EXAM: DG HIP (WITH OR WITHOUT PELVIS) 2-3V LEFT COMPARISON:  Pelvis radiograph April 18, 2020 FINDINGS: Frontal and lateral views obtained. There is a  total hip replacement on the left. There is a comminuted fracture of the left proximal femur. Specifically, there is an obliquely oriented periprostatic fracture medially which extends into the proximal femoral diaphysis. There is medial displacement of the distal aspect of this fracture. There is also avulsion along the lateral aspect of the greater trochanter. Appearance stable compared to earlier in the day. No dislocation. No erosive change. Penile prosthesis noted. IMPRESSION: Total hip replacement on the left with fractures along the greater tuberosity and medial proximal femoral diaphysis. The displacement of fracture fragments is stable compared to earlier in the day. The alignment of the total hip replacement is unchanged with prosthetic components remaining well seated. No dislocation. Electronically Signed   By: Lowella Grip III M.D.   On: 04/18/2020 13:43  Oswald Hillock   [facility] If 7PM-7AM, please contact night-coverage at [email or web address], Office  [phone]   04/19/2020, 11:42 AM  LOS: 1 day

## 2020-04-19 NOTE — Progress Notes (Signed)
Inpatient Diabetes Program Recommendations  AACE/ADA: New Consensus Statement on Inpatient Glycemic Control (2015)  Target Ranges:  Prepandial:   less than 140 mg/dL      Peak postprandial:   less than 180 mg/dL (1-2 hours)      Critically ill patients:  140 - 180 mg/dL   Lab Results  Component Value Date   GLUCAP 223 (H) 04/19/2020   HGBA1C 9.3 (H) 04/19/2020    Review of Glycemic Control Results for [name] Small, [name] Small (MRN [id number]) as of 04/19/2020 09:47  Ref. Range 04/18/2020 20:18 04/19/2020 07:40  Glucose-Capillary Latest Ref Range: 70 - 99 mg/dL 221 (H) 223 (H)   Diabetes history: Type 2 DM Outpatient Diabetes medications: Glipizide 5 mg QAM, Lantus 15 units QHS Current orders for Inpatient glycemic control: Novolog 0-9 units TID, Novolog 0-5 units QHS  Inpatient Diabetes Program Recommendations:    With patient NPO, consider changing correction to Novolog 0-9 units Q4H.  If glucose trends exceed 200's mg/dL consistently, may want to add Lantus 6 units QD.  Thanks, Bronson Curb, MSN, RNC-OB Diabetes Coordinator [phone] (8a-5p)

## 2020-04-19 NOTE — OR Nursing (Signed)
Upon arrival to OR, I noted that the urine in the foley bag was reddish possibly due to blood in the urine.  I informed Dr. [name], who stated that he would notify the hospitalist.

## 2020-04-20 ENCOUNTER — Encounter (HOSPITAL_COMMUNITY): Payer: Self-pay | Admitting: Student

## 2020-04-20 LAB — URINALYSIS, ROUTINE W REFLEX MICROSCOPIC

## 2020-04-20 LAB — CBC
HCT: 29.7 % — ABNORMAL LOW (ref 39.0–52.0)
Hemoglobin: 9.4 g/dL — ABNORMAL LOW (ref 13.0–17.0)
MCH: 32.9 pg (ref 26.0–34.0)
MCHC: 31.6 g/dL (ref 30.0–36.0)
MCV: 103.8 fL — ABNORMAL HIGH (ref 80.0–100.0)
Platelets: 163 10*3/uL (ref 150–400)
RBC: 2.86 MIL/uL — ABNORMAL LOW (ref 4.22–5.81)
RDW: 12.4 % (ref 11.5–15.5)
WBC: 11.4 10*3/uL — ABNORMAL HIGH (ref 4.0–10.5)
nRBC: 0 % (ref 0.0–0.2)

## 2020-04-20 LAB — GLUCOSE, CAPILLARY
Glucose-Capillary: 264 mg/dL — ABNORMAL HIGH (ref 70–99)
Glucose-Capillary: 263 mg/dL — ABNORMAL HIGH (ref 70–99)
Glucose-Capillary: 245 mg/dL — ABNORMAL HIGH (ref 70–99)
Glucose-Capillary: 260 mg/dL — ABNORMAL HIGH (ref 70–99)

## 2020-04-20 LAB — URINALYSIS, MICROSCOPIC (REFLEX): RBC / HPF: >50 RBC/hpf (ref 0–5)

## 2020-04-20 LAB — VITAMIN D 25 HYDROXY (VIT D DEFICIENCY, FRACTURES): Vit D, 25-Hydroxy: 30.03 ng/mL (ref 30–100)

## 2020-04-20 MED ORDER — INSULIN GLARGINE 100 UNIT/ML ~~LOC~~ SOLN
15.0000 [IU] | Freq: Every day | SUBCUTANEOUS | Status: DC
  Administered 2020-04-20 – 2020-04-23 (×4): 15 [IU] via SUBCUTANEOUS
  Filled 2020-04-20 (×5): qty 0.15

## 2020-04-20 MED ORDER — ACETAMINOPHEN 325 MG PO TABS
325.0000 mg | ORAL_TABLET | Freq: Four times a day (QID) | ORAL | Status: DC | PRN
  Administered 2020-04-20 – 2020-04-26 (×6): 650 mg via ORAL
  Filled 2020-04-20 (×5): qty 2

## 2020-04-20 NOTE — Evaluation (Signed)
Physical Therapy Evaluation Patient Details Name: [name] Small MRN: [id number] DOB: 1928/01/28 Today's Date: 04/20/2020   History of Present Illness  Pt is an 86 year old man admitted with L hip fx on 04/18/20 after falling. Underwent ORIF on 04/19/20. PMH: COPD (per wife not 02 dependent), HTN, CAD, PVD, L transmet amputation, DM, afib, CHF, CKD, renal disease and anxiety.  Clinical Impression  Pt admitted with above diagnosis. At the time of PT eval pt was limited by pain, but overall gave a good rehab effort. Pt with difficulty maintaining TDWB status on the LLE. At d/c, the patient's wife is hopeful for SNF. I feel this pt would benefit from the higher intensity level of skilled therapy at [facility], however if pt does not qualify would recommend SNF. Pt currently with functional limitations due to the deficits listed below (see PT Problem List). Pt will benefit from skilled PT to increase their independence and safety with mobility to allow discharge to the venue listed below.       Follow Up Recommendations [facility];Supervision/Assistance - 24 hour    Equipment Recommendations  None recommended by PT    Recommendations for Other Services Rehab consult     Precautions / Restrictions Precautions Precautions: Fall Restrictions Weight Bearing Restrictions: Yes LLE Weight Bearing: Touchdown weight bearing      Mobility  Bed Mobility Overal bed mobility: Needs Assistance Bed Mobility: Supine to Sit;Sit to Supine     Supine to sit: Mod assist Sit to supine: +2 for physical assistance;Mod assist   General bed mobility comments: assist for L LE over EOB and to position hips with bed pad at EOB, assist for LEs and to guide trunk to return to supine  Transfers Overall transfer level: Needs assistance Equipment used: Rolling walker (2 wheeled);Ambulation equipment used Transfers: Sit to/from Stand Sit to Stand: +2 physical assistance;Max assist         General transfer comment: cues for  hand placement and TDWB on L LE, significant assist to rise and steady, pt with heavy R side and posterior lean, attempted with RW and sara stedy from elevated bed. unable to achieve full standing position.   Ambulation/Gait             General Gait Details: Deferred  Stairs            Wheelchair Mobility    Modified Rankin (Stroke Patients Only)       Balance Overall balance assessment: Needs assistance   Sitting balance-Leahy Scale: Poor   Postural control: Posterior lean   Standing balance-Leahy Scale: Zero                               Pertinent Vitals/Pain Pain Assessment: Faces Faces Pain Scale: Hurts little more Pain Location: L hip Pain Descriptors / Indicators: Grimacing;Guarding Pain Intervention(s): Limited activity within patient's tolerance;Monitored during session;Repositioned    Home Living Family/patient expects to be discharged to:: Private residence Living Arrangements: Spouse/significant other Available Help at Discharge: Family;Available 24 hours/day Type of Home: House Home Access: Stairs to enter Entrance Stairs-Rails: Left Entrance Stairs-Number of Steps: 3 Home Layout: One level Home Equipment: Shower seat;Hand held shower head;Grab bars - tub/shower;Walker - 4 wheels;Cane - single point      Prior Function Level of Independence: Needs assistance   Gait / Transfers Assistance Needed: walks with a cane or walker  ADL's / Homemaking Assistance Needed: assisted for showering, assisted for LE dressing  and all IADL        Hand Dominance   Dominant Hand: Left    Extremity/Trunk Assessment   Upper Extremity Assessment Upper Extremity Assessment: Overall WFL for tasks assessed    Lower Extremity Assessment Lower Extremity Assessment: Generalized weakness;LLE deficits/detail LLE Deficits / Details: Acute pain, and decreased strength/AROM consistent with above mentioned procedure.     Cervical / Trunk  Assessment Cervical / Trunk Assessment: Other exceptions Cervical / Trunk Exceptions: Forward head posture with rounded shoulders  Communication   Communication: HOH (aid in L ear)  Cognition Arousal/Alertness: Awake/alert Behavior During Therapy: WFL for tasks assessed/performed Overall Cognitive Status: Difficult to assess                                        General Comments      Exercises     Assessment/Plan    PT Assessment Patient needs continued PT services  PT Problem List Decreased strength;Decreased range of motion;Decreased activity tolerance;Decreased balance;Decreased mobility;Decreased knowledge of use of DME;Decreased safety awareness;Decreased knowledge of precautions;Pain       PT Treatment Interventions DME instruction;Gait training;Functional mobility training;Therapeutic activities;Therapeutic exercise;Neuromuscular re-education;Patient/family education    PT Goals (Current goals can be found in the Care Plan section)  Acute Rehab PT Goals Patient Stated Goal: to get stronger and return home PT Goal Formulation: With patient/family Time For Goal Achievement: 05/04/20 Potential to Achieve Goals: Good    Frequency Min 3X/week   Barriers to discharge        Co-evaluation PT/OT/SLP Co-Evaluation/Treatment: Yes Reason for Co-Treatment: For patient/therapist safety;To address functional/ADL transfers PT goals addressed during session: Mobility/safety with mobility;Balance;Proper use of DME OT goals addressed during session: ADL's and self-care       AM-PAC PT "6 Clicks" Mobility  Outcome Measure Help needed turning from your back to your side while in a flat bed without using bedrails?: A Little Help needed moving from lying on your back to sitting on the side of a flat bed without using bedrails?: A Lot Help needed moving to and from a bed to a chair (including a wheelchair)?: A Lot Help needed standing up from a chair using your  arms (e.g., wheelchair or bedside chair)?: A Lot Help needed to walk in hospital room?: Total Help needed climbing 3-5 steps with a railing? : Total 6 Click Score: 11    End of Session Equipment Utilized During Treatment: Gait belt Activity Tolerance: Patient limited by pain Patient left: in bed;with call bell/phone within reach;with bed alarm set;with family/visitor present Nurse Communication: Mobility status;Need for lift equipment PT Visit Diagnosis: Unsteadiness on feet (R26.81);Pain;Difficulty in walking, not elsewhere classified (R26.2) Pain - Right/Left: Left Pain - part of body: Hip    Time: 1200-1227 PT Time Calculation (min) (ACUTE ONLY): 27 min   Charges:   PT Evaluation $PT Eval Moderate Complexity: 1 Mod          Rolinda Roan, PT, DPT Acute Rehabilitation Services Pager: [phone] Office: [phone]   Thelma Comp 04/20/2020, 3:16 PM

## 2020-04-20 NOTE — Plan of Care (Signed)
  Problem: Education: Goal: Verbalization of understanding the information provided (i.e., activity precautions, restrictions, etc) will improve Outcome: Progressing Goal: Individualized Educational Video(s) Outcome: Progressing   Problem: Self-Concept: Goal: Ability to maintain and perform role responsibilities to the fullest extent possible will improve Outcome: Progressing   Problem: Pain Management: Goal: Pain level will decrease Outcome: Progressing   Problem: Education: Goal: Knowledge of General Education information will improve Description: Including pain rating scale, medication(s)/side effects and non-pharmacologic comfort measures Outcome: Progressing   Problem: Clinical Measurements: Goal: Ability to maintain clinical measurements within normal limits will improve Outcome: Progressing   Problem: Activity: Goal: Risk for activity intolerance will decrease Outcome: Progressing   Problem: Nutrition: Goal: Adequate nutrition will be maintained Outcome: Progressing

## 2020-04-20 NOTE — Progress Notes (Addendum)
[facility]  PROGRESS NOTE  [name] Small MOL:[id number] DOB: 08/23/1928 DOA: 04/18/2020 PCP: Tonia Ghent, MD   Brief HPI:   86 year old male with medical history of nonobstructive CAD, hypertension, hyperlipidemia, diverticulosis, non-insulin-dependent diabetes type 2, COPD on room air, CKD stage IIIb, heart failure with preserved ejection fraction, arthritis status post left femur fracture and ORIF 2018 presented from home after mechanical fall stepping off a curb and missing his step, he fell onto his left side with left hip pain and rib pain.  Patient denied any syncope, presyncope, headache. In the ED imaging was remarkable for periprosthetic fracture, orthopedics was consulted and recommended transfer to [facility] for surgical repair.    Subjective   Patient seen and examined, s/p ORIF for left proximal femur fracture.  Patient has a Foley catheter in place, developed hematuria yesterday.   Assessment/Plan:     1. S/p ORIF left proximal femur fracture-patient underwent ORIF for left proximal femur fracture, pain is well controlled.  Orthopedics following.  DVT prophylaxis per orthopedics.   2. Hematuria-patient developed hematuria, has a Foley catheter in place, likely traumatic from catheter insertion.  Will check UA.  He does have a history of bladder cancer and is followed by urology as outpatient.  Will observe next 24 to 48 hours.  If no improvement hematuria consider urology consultation. 3. Acute blood loss anemia-patient's hemoglobin has dropped from 13.9-9.4 in 2 days.  Likely from acute blood loss anemia as well as ongoing hematuria as above.  Will closely follow patient's hemoglobin and transfuse for hemoglobin less than 7. 4. Hypertension-continue home medication daily amlodipine, furosemide is on hold due to poor p.o. intake. 5. CKD stage IIIb-creatinine is 1.93 better than yesterday, follow BMP in a.m. 6. Hyperlipidemia-continue atorvastatin 7. Diabetes  mellitus type 2-hold p.o. medications.  Continue sliding scale insulin with NovoLog.  Will restart Lantus 15 units subcu daily. 8. COPD-not in exacerbation, continue Breo      Lab Results  Component Value Date   [place] NEGATIVE 04/18/2020     CBG: Recent Labs  Lab 04/19/20 1230 04/19/20 1448 04/19/20 1755 04/19/20 2055 04/20/20 0824  GLUCAP 159* 109* 118* 195* 264*    CBC: Recent Labs  Lab 04/18/20 1136 04/20/20 0417  WBC 8.0 11.4*  NEUTROABS 5.6  --   HGB 13.9 9.4*  HCT 43.0 29.7*  MCV 101.4* 103.8*  PLT 221 201    Basic Metabolic Panel: Recent Labs  Lab 04/18/20 1136  NA 138  K 5.1  CL 98  CO2 29  GLUCOSE 271*  BUN 32*  CREATININE 1.93*  CALCIUM 8.4*        DVT prophylaxis: Heparin  Code Status: Full code  Family Communication: No family at bedside    Status is: Inpatient  Dispo: The patient is from: Home              Anticipated d/c is to: Skilled nursing facility              Anticipated d/c date is: 04/21/2020              Patient currently not medically stable for discharge  Barrier to discharge-ongoing hematuria, s/p ORIF for periprosthetic fracture        Scheduled medications:  . amLODipine  2.5 mg Oral Daily  . Apremilast  1 tablet Oral Daily  . atorvastatin  10 mg Oral Daily  . Chlorhexidine Gluconate Cloth  6 each Topical Daily  . enoxaparin (LOVENOX) injection  40  mg Subcutaneous Q24H  . escitalopram  5 mg Oral Daily  . fluticasone furoate-vilanterol  1 puff Inhalation Daily  . insulin aspart  0-5 Units Subcutaneous QHS  . insulin aspart  0-9 Units Subcutaneous TID WC  . senna  1 tablet Oral BID  . umeclidinium bromide  1 puff Inhalation Daily    Consultants:  Orthopedics  Procedures:    Antibiotics:   Anti-infectives (From admission, onward)   Start     Dose/Rate Route Frequency Ordered Stop   04/19/20 2200  ceFAZolin (ANCEF) IVPB 2g/100 mL premix     Discontinue     2 g 200 mL/hr over 30 Minutes  Intravenous Every 8 hours 04/19/20 2026 04/20/20 2159   04/19/20 1638  vancomycin (VANCOCIN) powder  Status:  Discontinued          As needed 04/19/20 1638 04/19/20 1747   04/19/20 1500  ceFAZolin (ANCEF) IVPB 2g/100 mL premix        2 g 200 mL/hr over 30 Minutes Intravenous  Once 04/19/20 1446 04/19/20 1551   04/19/20 1458  ceFAZolin (ANCEF) 2-4 GM/100ML-% IVPB       Note to Pharmacy: Gleason, Ginger   : cabinet override      04/19/20 1458 04/19/20 1530       Objective   Vitals:   04/20/20 0010 04/20/20 0611 04/20/20 0812 04/20/20 0825  BP: (!) 106/52 (!) 119/55  (!) 109/55  Pulse: 84 81 86 84  Resp: 16 15 16 14   Temp: 98.1 F (36.7 C) 98.3 F (36.8 C)  97.6 F (36.4 C)  TempSrc: Oral Oral  Oral  SpO2: 98% 97% 94% 96%  Weight:      Height:        Intake/Output Summary (Last 24 hours) at 04/20/2020 1048 Last data filed at 04/20/2020 0900 Gross per 24 hour  Intake 1710 ml  Output 1050 ml  Net 660 ml    06/08 1901 - 06/10 0700 In: 1350 [I.V.:1000] Out: 1050 [Urine:650]  Filed Weights   04/19/20 0816  Weight: 94.9 kg    Physical Examination:    General: Appears in no acute distress Cardiovascular: S1-S2, regular, no murmur auscultated Respiratory: Clear to auscultation bilaterally Abdomen: Abdomen is soft, nontender, no organomegaly Extremities: No edema in the lower extremities Neurologic: No focal deficit noted      Data Reviewed:   Recent Results (from the past 240 hour(s))  Resp Panel by RT PCR (RSV, Flu A&B, Covid) - Nasopharyngeal Swab     Status: None   Collection Time: 04/18/20 11:36 AM   Specimen: Nasopharyngeal Swab  Result Value Ref Range Status   SARS Coronavirus 2 by RT PCR NEGATIVE NEGATIVE Final    Comment: (NOTE) SARS-CoV-2 target nucleic acids are NOT DETECTED. The SARS-CoV-2 RNA is generally detectable in upper respiratoy specimens during the acute phase of infection. The lowest concentration of SARS-CoV-2 viral copies this assay  can detect is 131 copies/mL. A negative result does not preclude SARS-Cov-2 infection and should not be used as the sole basis for treatment or other patient management decisions. A negative result may occur with  improper specimen collection/handling, submission of specimen other than nasopharyngeal swab, presence of viral mutation(s) within the areas targeted by this assay, and inadequate number of viral copies (<131 copies/mL). A negative result must be combined with clinical observations, patient history, and epidemiological information. The expected result is Negative. Fact Sheet for Patients:  PinkCheek.be Fact Sheet for Healthcare Providers:  GravelBags.it This test is  not yet ap proved or cleared by the [place] and  has been authorized for detection and/or diagnosis of SARS-CoV-2 by FDA under an Emergency Use Authorization (EUA). This EUA will remain  in effect (meaning this test can be used) for the duration of the COVID-19 declaration under Section 564(b)(1) of the Act, 21 U.S.C. section 360bbb-3(b)(1), unless the authorization is terminated or revoked sooner.    Influenza A by PCR NEGATIVE NEGATIVE Final   Influenza B by PCR NEGATIVE NEGATIVE Final    Comment: (NOTE) The Xpert Xpress SARS-CoV-2/FLU/RSV assay is intended as an aid in  the diagnosis of influenza from Nasopharyngeal swab specimens and  should not be used as a sole basis for treatment. Nasal washings and  aspirates are unacceptable for Xpert Xpress SARS-CoV-2/FLU/RSV  testing. Fact Sheet for Patients: PinkCheek.be Fact Sheet for Healthcare Providers: GravelBags.it This test is not yet approved or cleared by the [place] FDA and  has been authorized for detection and/or diagnosis of SARS-CoV-2 by  FDA under an Emergency Use Authorization (EUA). This EUA will remain  in effect  (meaning this test can be used) for the duration of the  Covid-19 declaration under Section 564(b)(1) of the Act, 21  U.S.C. section 360bbb-3(b)(1), unless the authorization is  terminated or revoked.    Respiratory Syncytial Virus by PCR NEGATIVE NEGATIVE Final    Comment: (NOTE) Fact Sheet for Patients: PinkCheek.be Fact Sheet for Healthcare Providers: GravelBags.it This test is not yet approved or cleared by the [place] FDA and  has been authorized for detection and/or diagnosis of SARS-CoV-2 by  FDA under an Emergency Use Authorization (EUA). This EUA will remain  in effect (meaning this test can be used) for the duration of the  COVID-19 declaration under Section 564(b)(1) of the Act, 21 U.S.C.  section 360bbb-3(b)(1), unless the authorization is terminated or  revoked. Performed at [facility], [place] [address] [place], [place] [address]   MRSA PCR Screening     Status: None   Collection Time: 04/19/20  5:26 AM   Specimen: Nasal Mucosa; Nasopharyngeal  Result Value Ref Range Status   MRSA by PCR NEGATIVE NEGATIVE Final    Comment:        The GeneXpert MRSA Assay (FDA approved for NASAL specimens only), is one component of a comprehensive MRSA colonization surveillance program. It is not intended to diagnose MRSA infection nor to guide or monitor treatment for MRSA infections. Performed at [facility], [place] [address], [place], [place] [address]     No results for input(s): LIPASE, AMYLASE in the last 168 hours. No results for input(s): AMMONIA in the last 168 hours.  Cardiac Enzymes: No results for input(s): CKTOTAL, CKMB, CKMBINDEX, TROPONINI in the last 168 hours. BNP (last 3 results) No results for input(s): BNP in the last 8760 hours.  ProBNP (last 3 results) No results for input(s): PROBNP in the last 8760 hours.  Studies:  DG Chest 1 View  Result Date:  04/18/2020 CLINICAL DATA:  Pain following fall EXAM: CHEST  1 VIEW COMPARISON:  September 25, 2018 FINDINGS: Lungs are clear. Heart size and pulmonary vascularity are normal. No adenopathy. There is aortic atherosclerosis. No pneumothorax. No fracture noted in the chest region. IMPRESSION: Lungs clear. Cardiac silhouette normal. Aortic Atherosclerosis (ICD10-I70.0). Electronically Signed   By: Lowella Grip III M.D.   On: 04/18/2020 13:44   DG Pelvis Portable  Result Date: 04/18/2020 CLINICAL DATA:  Left hip pain after fall  EXAM: PORTABLE PELVIS 1-2 VIEWS COMPARISON:  12/27/2016 FINDINGS: There is an obliquely oriented periprosthetic fracture deformity involving the proximal left femur. Fracture line extends into the greater trochanter. The fracture fragments are in near anatomic alignment. Moderate to advanced right hip osteoarthritis. Penile prosthesis again noted IMPRESSION: Acute obliquely oriented periprosthetic fracture deformity involves the proximal left femur. Electronically Signed   By: Kerby Moors M.D.   On: 04/18/2020 11:37   DG C-Arm 1-60 Min  Result Date: 04/19/2020 CLINICAL DATA:  Left femur repair. EXAM: LEFT FEMUR 2 VIEWS; DG C-ARM 1-60 MIN COMPARISON:  Preoperative radiograph yesterday FINDINGS: Eleven fluoroscopic spot views in the operating room obtained. Lateral plate with locking screws as well as cerclage wire fixation of periprosthetic femur fracture. Total fluoroscopy time is 93 seconds. Total dose 14.73 mGy IMPRESSION: Procedural fluoroscopy during periprosthetic femur fracture fixation. Electronically Signed   By: Keith Rake M.D.   On: 04/19/2020 19:37   DG Hip Unilat W or Wo Pelvis 2-3 Views Left  Result Date: 04/18/2020 CLINICAL DATA:  Pain following fall EXAM: DG HIP (WITH OR WITHOUT PELVIS) 2-3V LEFT COMPARISON:  Pelvis radiograph April 18, 2020 FINDINGS: Frontal and lateral views obtained. There is a total hip replacement on the left. There is a comminuted fracture  of the left proximal femur. Specifically, there is an obliquely oriented periprostatic fracture medially which extends into the proximal femoral diaphysis. There is medial displacement of the distal aspect of this fracture. There is also avulsion along the lateral aspect of the greater trochanter. Appearance stable compared to earlier in the day. No dislocation. No erosive change. Penile prosthesis noted. IMPRESSION: Total hip replacement on the left with fractures along the greater tuberosity and medial proximal femoral diaphysis. The displacement of fracture fragments is stable compared to earlier in the day. The alignment of the total hip replacement is unchanged with prosthetic components remaining well seated. No dislocation. Electronically Signed   By: Lowella Grip III M.D.   On: 04/18/2020 13:43   DG FEMUR MIN 2 VIEWS LEFT  Result Date: 04/19/2020 CLINICAL DATA:  Left femur repair. EXAM: LEFT FEMUR 2 VIEWS; DG C-ARM 1-60 MIN COMPARISON:  Preoperative radiograph yesterday FINDINGS: Eleven fluoroscopic spot views in the operating room obtained. Lateral plate with locking screws as well as cerclage wire fixation of periprosthetic femur fracture. Total fluoroscopy time is 93 seconds. Total dose 14.73 mGy IMPRESSION: Procedural fluoroscopy during periprosthetic femur fracture fixation. Electronically Signed   By: Keith Rake M.D.   On: 04/19/2020 19:37   DG FEMUR PORT MIN 2 VIEWS LEFT  Result Date: 04/19/2020 CLINICAL DATA:  Postop. EXAM: LEFT FEMUR PORTABLE 2 VIEWS COMPARISON:  Preoperative radiograph yesterday FINDINGS: Lateral plate and multi screw fixation of periprosthetic left proximal femur fracture. Additional cerclage wires. Left hip arthroplasty remains. Recent postsurgical change includes air and edema in the adjacent soft tissues. IMPRESSION: Lateral plate and screw fixation of periprosthetic left proximal femur fracture. Electronically Signed   By: Keith Rake M.D.   On:  04/19/2020 19:41       [place]   [facility] If 7PM-7AM, please contact night-coverage at [email or web address], Office  [phone]   04/20/2020, 10:48 AM  LOS: 2 days

## 2020-04-20 NOTE — Progress Notes (Signed)
Orthopaedic Trauma Progress Note  S: Doing extremely well this morning, minimal pain. Daughter at bedside. She is slightly concerned regarding hematuria as patient has history of bladder CA. Will defer to medicine team for this. No other questions or concerns currently  O:  Vitals:   04/20/20 0812 04/20/20 0825  BP:  (!) 109/55  Pulse: 86 84  Resp: 16 14  Temp:  97.6 F (36.4 C)  SpO2: 94% 96%    General: Sitting up in bed, NAD. Pleasant and cooperative Respiratory: No increased work of breathing. Lungs CTA anterior lung fields bilaterally Left Lower Extremity: Dressings are clean, dry, intact.  No significant tenderness with palpation over the hip and lateral thigh.  Tolerates very small amount of passive knee flexion.  Ankle dorsiflexion plantarflexion is intact.  Extremity is warm  Imaging: Stable post op imaging.   Labs:  Results for orders placed or performed during the hospital encounter of 04/18/20 (from the past 24 hour(s))  Glucose, capillary     Status: Abnormal   Collection Time: 04/19/20 12:30 PM  Result Value Ref Range   Glucose-Capillary 159 (H) 70 - 99 mg/dL  Glucose, capillary     Status: Abnormal   Collection Time: 04/19/20  2:48 PM  Result Value Ref Range   Glucose-Capillary 109 (H) 70 - 99 mg/dL   Comment 1 Notify RN    Comment 2 Document in Chart   Glucose, capillary     Status: Abnormal   Collection Time: 04/19/20  5:55 PM  Result Value Ref Range   Glucose-Capillary 118 (H) 70 - 99 mg/dL  Glucose, capillary     Status: Abnormal   Collection Time: 04/19/20  8:55 PM  Result Value Ref Range   Glucose-Capillary 195 (H) 70 - 99 mg/dL  CBC     Status: Abnormal   Collection Time: 04/20/20  4:17 AM  Result Value Ref Range   WBC 11.4 (H) 4.0 - 10.5 K/uL   RBC 2.86 (L) 4.22 - 5.81 MIL/uL   Hemoglobin 9.4 (L) 13.0 - 17.0 g/dL   HCT 29.7 (L) 39 - 52 %   MCV 103.8 (H) 80.0 - 100.0 fL   MCH 32.9 26.0 - 34.0 pg   MCHC 31.6 30.0 - 36.0 g/dL   RDW 12.4 11.5 -  15.5 %   Platelets 163 150 - 400 K/uL   nRBC 0.0 0.0 - 0.2 %  VITAMIN D 25 Hydroxy (Vit-D Deficiency, Fractures)     Status: None   Collection Time: 04/20/20  4:17 AM  Result Value Ref Range   Vit D, 25-Hydroxy 30.03 30 - 100 ng/mL  Glucose, capillary     Status: Abnormal   Collection Time: 04/20/20  8:24 AM  Result Value Ref Range   Glucose-Capillary 264 (H) 70 - 99 mg/dL    Assessment: 86 year old male with previous left hip arthroplasty status post fall, 1 Day Post-Op   Injuries: Left periprosthetic femur fracture s/p ORIF  Weightbearing: TDWB LLE  Insicional and dressing care: Plan to remove dressings tomorrow and leave incisions open to air if no drainage  Showering: Okay to begin showering with assistance on 04/22/2020  Orthopedic device(s): None   CV/Blood loss: Acute blood loss anemia, Hgb 9.4 this morning. Hemodynamically stable  Pain management:  1. Tylenol 325-650 mg q 6 hours PRN 2. Robaxin 500 mg q 6 hours PRN 3. Norco 5-325 q 6 hours PRN moderate pain 4. Norco 7.5-325 q 6 hours PRN severe pain 5. Dilaudid 1 mg q 3  hours PRN  VTE prophylaxis: Lovenox, SCDs  ID:  Ancef 2gm post op  Foley/Lines:  Foley currently in place, [facility] IVFs  Medical co-morbidities: nonobstructive CAD, hypertension, hyperlipidemia, diverticulosis, non-insulin-dependent diabetes type 2, COPD on room air, CKD 3B, heart failure with preserved ejection fraction(  Impediments to Fracture Healing: diabetes.  Vitamin D level looks okay at 30.  No need for supplementation currently  Dispo: PT/OT evaluation today, dispo pending.    Follow - up plan: 2 weeks  Contact information:  Katha Hamming MD, Patrecia Pace PA-C   [facility] [phone] (office) [email or web address]

## 2020-04-20 NOTE — Progress Notes (Signed)
Rehab Admissions Coordinator Note:  Patient was screened by Cleatrice Burke for appropriateness for an Inpatient Acute Rehab Consult.  At this time, we are recommending Inpatient Rehab consult. I will place order for rehab consult per protocol. [employer] medicare may not approve;  Cleatrice Burke RN MSN 04/20/2020, 3:34 PM  I can be reached at [phone].

## 2020-04-20 NOTE — Evaluation (Signed)
Occupational Therapy Evaluation Patient Details Name: [name] Small MRN: [id number] DOB: 1928/02/21 Today's Date: 04/20/2020    History of Present Illness Pt is an 86 year old man admitted with L hip fx on 04/18/20 after falling. Underwent ORIF on 04/19/20. PMH: COPD (per wife not 02 dependent), HTN, CAD, PVD, L transmet amputation, DM, afib, CHF, CKD, renal disease and anxiety.   Clinical Impression   Pt ambulated with a RW, was assisted for showering and LB dressing and all IADL prior to admission. Presents with generalized weakness with decreased sitting and standing balance. He requires +2 max assist to stand and has difficulty maintaining TDWB on L LE. Pt needs set up to total assist for ADL. Pt will need post acute rehab, wife is hopeful for CIR. Will follow acutely.    Follow Up Recommendations  CIR    Equipment Recommendations  3 in 1 bedside commode;Wheelchair (measurements OT);Wheelchair cushion (measurements OT)    Recommendations for Other Services Rehab consult     Precautions / Restrictions Precautions Precautions: Fall Restrictions Weight Bearing Restrictions: Yes LLE Weight Bearing: Touchdown weight bearing      Mobility Bed Mobility Overal bed mobility: Needs Assistance Bed Mobility: Supine to Sit;Sit to Supine     Supine to sit: Mod assist Sit to supine: +2 for physical assistance;Mod assist   General bed mobility comments: assist for L LE over EOB and to position hips with bed pad at EOB, assist for LEs and to guide trunk to return to supine  Transfers Overall transfer level: Needs assistance Equipment used: Rolling walker (2 wheeled);Ambulation equipment used Transfers: Sit to/from Stand Sit to Stand: +2 physical assistance;Max assist         General transfer comment: cues for hand placement and TDWB on L LE, significant assist to rise and steady, pt with heavy R side and posterior lean, attempted with RW and sara stedy from elevated bed     Balance Overall balance assessment: Needs assistance   Sitting balance-Leahy Scale: Poor   Postural control: Posterior lean   Standing balance-Leahy Scale: Zero                             ADL either performed or assessed with clinical judgement   ADL Overall ADL's : Needs assistance/impaired Eating/Feeding: Set up;Bed level   Grooming: Set up;Wash/dry hands;Wash/dry face;Bed level   Upper Body Bathing: Moderate assistance;Sitting   Lower Body Bathing: Total assistance;Bed level   Upper Body Dressing : Moderate assistance;Sitting   Lower Body Dressing: Total assistance;Bed level       Toileting- Clothing Manipulation and Hygiene: Total assistance;Bed level Toileting - Clothing Manipulation Details (indicate cue type and reason): pt with bowel incontinence             Vision Baseline Vision/History: No visual deficits Patient Visual Report: No change from baseline       Perception     Praxis      Pertinent Vitals/Pain Pain Assessment: Faces Faces Pain Scale: Hurts little more Pain Location: L hip Pain Descriptors / Indicators: Grimacing;Guarding Pain Intervention(s): Monitored during session;Repositioned     Hand Dominance Left   Extremity/Trunk Assessment Upper Extremity Assessment Upper Extremity Assessment: Overall WFL for tasks assessed   Lower Extremity Assessment Lower Extremity Assessment: Defer to PT evaluation       Communication Communication Communication: HOH (aid in L ear)   Cognition Arousal/Alertness: Awake/alert Behavior During Therapy: WFL for tasks assessed/performed Overall Cognitive  Status: Difficult to assess                                     General Comments       Exercises     Shoulder Instructions      Home Living Family/patient expects to be discharged to:: Private residence Living Arrangements: Spouse/significant other Available Help at Discharge: Family;Available 24  hours/day Type of Home: House Home Access: Stairs to enter [employer] of Steps: 3 Entrance Stairs-Rails: Left Home Layout: One level     Bathroom Shower/Tub: Occupational psychologist: Handicapped height     Home Equipment: [place] held shower head;Grab bars - tub/shower;Walker - 4 wheels;Cane - single point          Prior Functioning/Environment Level of Independence: Needs assistance  Gait / Transfers Assistance Needed: walks with a cane or walker ADL's / Homemaking Assistance Needed: assisted for showering, assisted for LE dressing and all IADL            OT Problem List: Decreased strength;Decreased activity tolerance;Impaired balance (sitting and/or standing);Decreased knowledge of use of DME or AE;Pain      OT Treatment/Interventions: Self-care/ADL training;DME and/or AE instruction;Patient/family education;Balance training;Therapeutic activities    OT Goals(Current goals can be found in the care plan section) Acute Rehab OT Goals Patient Stated Goal: to get stronger and return home OT Goal Formulation: With patient/family Time For Goal Achievement: 05/04/20 Potential to Achieve Goals: Good ADL Goals Pt Will Perform Grooming: with supervision;sitting Pt Will Perform Upper Body Bathing: with min assist;sitting Pt Will Perform Upper Body Dressing: with supervision;sitting Pt Will Transfer to Toilet: with mod assist;stand pivot transfer Additional ADL Goal #1: Pt will demonstrate ability to sit EOB with supervision x 10 minutes while engaged in ADL. Additional ADL Goal #2: Pt will perform bed mobility with min assist in preparation for ADL.  OT Frequency: Min 2X/week   Barriers to D/C:            Co-evaluation PT/OT/SLP Co-Evaluation/Treatment: Yes Reason for Co-Treatment: For patient/therapist safety   OT goals addressed during session: ADL's and self-care      AM-PAC OT "6 Clicks" Daily Activity     Outcome Measure Help  from another person eating meals?: None Help from another person taking care of personal grooming?: A Little Help from another person toileting, which includes using toliet, bedpan, or urinal?: Total Help from another person bathing (including washing, rinsing, drying)?: A Lot Help from another person to put on and taking off regular upper body clothing?: A Lot Help from another person to put on and taking off regular lower body clothing?: Total 6 Click Score: 13   End of Session Equipment Utilized During Treatment: Gait belt;Rolling walker Nurse Communication: Need for lift equipment  Activity Tolerance: Patient tolerated treatment well Patient left: in bed;with call bell/phone within reach;with bed alarm set;with family/visitor present  OT Visit Diagnosis: Pain;Muscle weakness (generalized) (M62.81)                Time: 1200-1230 OT Time Calculation (min): 30 min Charges:  OT General Charges $OT Visit: 1 Visit OT Evaluation $OT Eval Moderate Complexity: 1 Mod  Nestor Lewandowsky, OTR/L Acute Rehabilitation Services Pager: [phone] Office: [phone]  Malka So 04/20/2020, 1:50 PM

## 2020-04-21 DIAGNOSIS — R319 Hematuria, unspecified: Secondary | ICD-10-CM

## 2020-04-21 LAB — CBC
HCT: 24.6 % — ABNORMAL LOW (ref 39.0–52.0)
Hemoglobin: 8 g/dL — ABNORMAL LOW (ref 13.0–17.0)
MCH: 33.2 pg (ref 26.0–34.0)
MCHC: 32.5 g/dL (ref 30.0–36.0)
MCV: 102.1 fL — ABNORMAL HIGH (ref 80.0–100.0)
Platelets: 183 10*3/uL (ref 150–400)
RBC: 2.41 MIL/uL — ABNORMAL LOW (ref 4.22–5.81)
RDW: 12.4 % (ref 11.5–15.5)
WBC: 9.1 10*3/uL (ref 4.0–10.5)
nRBC: 0 % (ref 0.0–0.2)

## 2020-04-21 LAB — GLUCOSE, CAPILLARY
Glucose-Capillary: 268 mg/dL — ABNORMAL HIGH (ref 70–99)
Glucose-Capillary: 232 mg/dL — ABNORMAL HIGH (ref 70–99)
Glucose-Capillary: 265 mg/dL — ABNORMAL HIGH (ref 70–99)
Glucose-Capillary: 220 mg/dL — ABNORMAL HIGH (ref 70–99)

## 2020-04-21 MED ORDER — LIDOCAINE HCL URETHRAL/MUCOSAL 2 % EX GEL
1.0000 "application " | Freq: Once | CUTANEOUS | Status: AC
  Administered 2020-04-21: 1 via URETHRAL
  Filled 2020-04-21 (×2): qty 11

## 2020-04-21 MED ORDER — MELATONIN 5 MG PO TABS
5.0000 mg | ORAL_TABLET | Freq: Every evening | ORAL | Status: DC | PRN
  Administered 2020-04-21 – 2020-04-25 (×5): 5 mg via ORAL
  Filled 2020-04-21 (×5): qty 1

## 2020-04-21 NOTE — NC FL2 (Signed)
[place] MEDICAID FL2 LEVEL OF CARE SCREENING TOOL     IDENTIFICATION  Patient Name: [name] Small Birthdate: 03-20-1928 Sex: male Admission Date (Current Location): 04/18/2020  [facility] and [place] Number:  Herbalist and Address:  [facility], [place] [address], [place], [place] [address]      Provider Number: [id number]  Attending Physician Name and Address:  Oswald Hillock, MD  Relative Name and Phone Number:  Rannie Craney - [phone]    Current Level of Care: SNF Recommended Level of Care: [facility] Prior Approval Number:    Date Approved/Denied:   PASRR Number: [id number]  Discharge Plan: SNF    Current Diagnoses: Patient Active Problem List   Diagnosis Date Noted  . Closed left hip fracture, initial encounter ([place]) 04/18/2020  . Anxiety 12/15/2019  . Laceration of right thumb without foreign body with damage to nail 12/15/2019  . Anemia 01/31/2017  . Hypoxia   . History of hip replacement 12/26/2016  . Status post lumbar laminectomy 12/26/2016  . S/p left hip fracture 12/26/2016  . Idiopathic chronic venous hypertension of left lower extremity with inflammation 11/07/2016  . Spinal stenosis of lumbar region with neurogenic claudication 10/18/2016  . Spinal stenosis of lumbar region 09/25/2016  . Fall 07/04/2016  . COPD (chronic obstructive pulmonary disease) ([place]) 06/10/2016  . Weakness 03/18/2016  . Critical lower limb ischemia 02/28/2016  . Peripheral arterial disease ([place]) 10/17/2015  . Edema 07/19/2015  . Claudication ([place]) 01/31/2015  . DOE (dyspnea on exertion) 01/31/2015  . Hyperlipidemia 01/31/2015  . HTN (hypertension) 01/31/2015  . Chronic diastolic CHF (congestive heart failure) ([place]) 01/21/2014  . Atrial fibrillation ([place]) 12/16/2012  . Hyperkalemia 11/27/2011  . Bladder cancer ([place]) 09/22/2011  . Leg cramps 08/28/2011  . Cough 05/26/2011  . CAD (coronary artery disease) of artery bypass  graft 05/09/2011  . Dyspnea on exertion 04/19/2011  . PSORIASIS, SCALP 10/25/2008  . Chronic kidney disease, stage III (moderate) 12/04/2007  . Type 2 diabetes mellitus with diabetic polyneuropathy ([place]) 05/21/2007  . DIVERTICULOSIS, COLON 05/21/2007  . BPH (benign prostatic hyperplasia) 05/21/2007    Orientation RESPIRATION BLADDER Height & Weight     Self, Time, Situation, Place  O2 Indwelling catheter Weight: 94.9 kg Height:  6\' 2"  (188 cm)  BEHAVIORAL SYMPTOMS/MOOD NEUROLOGICAL BOWEL NUTRITION STATUS      Continent Diet (See discharge summary)  AMBULATORY STATUS COMMUNICATION OF NEEDS Skin   Limited Assist Verbally Surgical wounds                       Personal Care Assistance Level of Assistance  Bathing, Feeding, Dressing Bathing Assistance: Limited assistance Feeding assistance: Independent Dressing Assistance: Limited assistance     Functional Limitations Info  Sight, Hearing, Speech Sight Info: Adequate Hearing Info: Impaired Speech Info: Adequate    SPECIAL CARE FACTORS FREQUENCY  PT (By licensed PT), OT (By licensed OT)     PT Frequency: 5 times per week OT Frequency: 5 times per week            Contractures Contractures Info: Not present    Additional Factors Info  Code Status, Allergies Code Status Info: full Allergies Info: actos, aspirin, ace inhibitors, bactrim, gabapentin, lyrica, pravastatin, sulfa           Current Medications (04/21/2020):  This is the current hospital active medication list Current Facility-Administered Medications  Medication Dose Route Frequency Provider Last Rate Last Admin  .  acetaminophen (TYLENOL) tablet 325-650 mg  325-650 mg Oral Q6H PRN Delray Alt, PA-C   650 mg at 04/20/20 2156  . amLODipine (NORVASC) tablet 2.5 mg  2.5 mg Oral Daily Patrecia Pace A, PA-C   2.5 mg at 04/21/20 0816  . atorvastatin (LIPITOR) tablet 10 mg  10 mg Oral Daily Patrecia Pace A, PA-C   10 mg at 04/21/20 0817  . Chlorhexidine  Gluconate Cloth 2 % PADS 6 each  6 each Topical Daily Delray Alt, PA-C   6 each at 04/21/20 0817  . enoxaparin (LOVENOX) injection 40 mg  40 mg Subcutaneous Q24H Patrecia Pace A, PA-C   40 mg at 04/21/20 0816  . escitalopram (LEXAPRO) tablet 5 mg  5 mg Oral Daily Patrecia Pace A, PA-C   5 mg at 04/21/20 0816  . fluticasone furoate-vilanterol (BREO ELLIPTA) 200-25 MCG/INH 1 puff  1 puff Inhalation Daily Delray Alt, PA-C   1 puff at 04/21/20 5883  . HYDROcodone-acetaminophen (NORCO) 7.5-325 MG per tablet 1-2 tablet  1-2 tablet Oral Q6H PRN Delray Alt, PA-C   2 tablet at 04/21/20 0554  . HYDROcodone-acetaminophen (NORCO/VICODIN) 5-325 MG per tablet 1-2 tablet  1-2 tablet Oral Q6H PRN Delray Alt, PA-C   2 tablet at 04/21/20 1243  . HYDROmorphone (DILAUDID) injection 1 mg  1 mg Intravenous Q3H PRN Patrecia Pace A, PA-C   1 mg at 04/19/20 2324  . insulin aspart (novoLOG) injection 0-5 Units  0-5 Units Subcutaneous QHS Delray Alt, PA-C   3 Units at 04/20/20 2158  . insulin aspart (novoLOG) injection 0-9 Units  0-9 Units Subcutaneous TID WC Patrecia Pace A, PA-C   3 Units at 04/21/20 1142  . insulin glargine (LANTUS) injection 15 Units  15 Units Subcutaneous QHS Oswald Hillock, MD   15 Units at 04/20/20 2156  . lidocaine (XYLOCAINE) 2 % jelly 1 application  1 application Urethral Once [place], Marge Duncans, MD      . melatonin tablet 5 mg  5 mg Oral QHS PRN Delray Alt, PA-C      . methocarbamol (ROBAXIN) tablet 500 mg  500 mg Oral Q6H PRN Patrecia Pace A, PA-C   500 mg at 04/20/20 2155  . polyethylene glycol (MIRALAX / GLYCOLAX) packet 17 g  17 g Oral Daily PRN Delray Alt, PA-C   17 g at 04/20/20 2227  . senna (SENOKOT) tablet 8.6 mg  1 tablet Oral BID Patrecia Pace A, PA-C   8.6 mg at 04/21/20 0816  . umeclidinium bromide (INCRUSE ELLIPTA) 62.5 MCG/INH 1 puff  1 puff Inhalation Daily Delray Alt, PA-C   1 puff at 04/21/20 2549     Discharge Medications: Please see discharge  summary for a list of discharge medications.  Relevant Imaging Results:  Relevant Lab Results:   Additional Information SS#: [id number]  Curlene Labrum, RN

## 2020-04-21 NOTE — TOC Initial Note (Signed)
Transition of Care [facility]) - Initial/Assessment Note    Patient Details  Name: [name] Small MRN: [id number] Date of Birth: 02-16-1928  Transition of [facility] CM/SW Contact:    Curlene Labrum, RN Phone Number: 04/21/2020, 1:48 PM  Clinical Narrative:                 Case Manager met with the family at the bedside to discuss transitions plans for home.  Patient is a S/P ORIF of left hip fracture and lives at home with his wife.  I spoke with the family and gave them choice regarding SNF placement in case patient is not approved for CIR.  I will place patient in the hub for SNF facilities but will not start insurance authorization at this time pending CIR approval or note by insurance.  Will continue to follow.  Expected Discharge Plan: IP Rehab Facility Barriers to Discharge:  (waiting on insurance auth for CIR first)   Patient Goals and CMS Choice Patient states their goals for this hospitalization and ongoing recovery are:: Patient would prefer CIR versus SNF placement at this time pending insurance approval CMS Medicare.gov Compare Post Acute Care list provided to:: Patient Choice offered to / list presented to : Spouse  Expected Discharge Plan and Services Expected Discharge Plan: [place]   Discharge Planning Services: CM Consult Post Acute Care Choice: Nursing Home, IP Rehab Living arrangements for the past 2 months: Single Family Home                                      Prior Living Arrangements/Services Living arrangements for the past 2 months: Single Family Home Lives with:: Spouse Patient language and need for interpreter reviewed:: Yes Do you feel safe going back to the place where you live?: Yes      Need for Family Participation in Patient Care: Yes (Comment) Care giver support system in place?: Yes (comment) Current home services: DME, Other (comment) (currently has Home O2 through [facility], RW, cane, 3:1 and rollator) Criminal  Activity/Legal Involvement Pertinent to Current Situation/Hospitalization: No - Comment as needed  Activities of Daily Living Home Assistive Devices/Equipment: CBG Meter, Hearing aid, Walker (specify type) (bilateral hearing aids, tripod walker) ADL Screening (condition at time of admission) Patient's cognitive ability adequate to safely complete daily activities?: Yes Is the patient deaf or have difficulty hearing?: Yes (wears bilateral hearing aids) Does the patient have difficulty seeing, even when wearing glasses/contacts?: No Does the patient have difficulty concentrating, remembering, or making decisions?: No Patient able to express need for assistance with ADLs?: Yes Does the patient have difficulty dressing or bathing?: Yes Independently performs ADLs?: No Communication: Independent Dressing (OT): Needs assistance Is this a change from baseline?: Change from baseline, expected to last >3 days Grooming: Needs assistance Is this a change from baseline?: Change from baseline, expected to last >3 days Feeding: Needs assistance Is this a change from baseline?: Change from baseline, expected to last >3 days Bathing: Needs assistance Is this a change from baseline?: Change from baseline, expected to last >3 days Toileting: Dependent Is this a change from baseline?: Change from baseline, expected to last >3days In/Out Bed: Dependent Is this a change from baseline?: Change from baseline, expected to last >3 days Walks in Home: Dependent Is this a change from baseline?: Change from baseline, expected to last >3 days Does the patient have difficulty walking or  climbing stairs?: Yes Weakness of Legs: Left Weakness of Arms/Hands: None  Permission Sought/Granted Permission sought to share information with : Case Manager Permission granted to share information with : Yes, Verbal Permission Granted     Permission granted to share info w AGENCY: CIR and/or SNF facility  Permission granted  to share info w Relationship: wife and family     Emotional Assessment Appearance:: Appears stated age   Affect (typically observed): Accepting Orientation: : Oriented to Self, Oriented to Place, Oriented to  Time, Oriented to Situation Alcohol / Substance Use: Not Applicable Psych Involvement: No (comment)  Admission diagnosis:  Closed left hip fracture, initial encounter ([place]) [S72.002A] Closed left subtrochanteric femur fracture, initial encounter [facility]) [S72.22XA] Patient Active Problem List   Diagnosis Date Noted  . Closed left hip fracture, initial encounter ([place]) 04/18/2020  . Anxiety 12/15/2019  . Laceration of right thumb without foreign body with damage to nail 12/15/2019  . Anemia 01/31/2017  . Hypoxia   . History of hip replacement 12/26/2016    Class: Chronic  . Status post lumbar laminectomy 12/26/2016    Class: Chronic  . S/p left hip fracture 12/26/2016  . Idiopathic chronic venous hypertension of left lower extremity with inflammation 11/07/2016  . Spinal stenosis of lumbar region with neurogenic claudication 10/18/2016  . Spinal stenosis of lumbar region 09/25/2016  . Fall 07/04/2016  . COPD (chronic obstructive pulmonary disease) ([place]) 06/10/2016  . Weakness 03/18/2016  . Critical lower limb ischemia 02/28/2016  . Peripheral arterial disease ([place]) 10/17/2015  . Edema 07/19/2015  . Claudication ([place]) 01/31/2015  . DOE (dyspnea on exertion) 01/31/2015  . Hyperlipidemia 01/31/2015  . HTN (hypertension) 01/31/2015  . Chronic diastolic CHF (congestive heart failure) ([place]) 01/21/2014  . Atrial fibrillation ([place]) 12/16/2012  . Hyperkalemia 11/27/2011  . Bladder cancer ([place]) 09/22/2011  . Leg cramps 08/28/2011  . Cough 05/26/2011  . CAD (coronary artery disease) of artery bypass graft 05/09/2011  . Dyspnea on exertion 04/19/2011  . PSORIASIS, SCALP 10/25/2008  . Chronic kidney disease, stage III (moderate) 12/04/2007  . Type 2 diabetes mellitus with diabetic  polyneuropathy ([place]) 05/21/2007  . DIVERTICULOSIS, COLON 05/21/2007  . BPH (benign prostatic hyperplasia) 05/21/2007   PCP:  Tonia Ghent, MD Pharmacy:   [facility] ([place]) [place], [place] [address] Phone: [phone] Fax: [phone]  [place], [place] [address] Phone: [phone] Fax: [phone]  [place], [place], [address] [place], [address] [place] [address] Phone: [phone] Fax: [phone]     Social Determinants of Health (SDOH) Interventions    Readmission Risk Interventions Readmission Risk Prevention Plan 04/21/2020  Transportation Screening Complete  PCP or Specialist Appt within 5-7 Days Complete  Home Care Screening Complete  Medication Review (RN CM) Complete  Some recent data might be hidden

## 2020-04-21 NOTE — Plan of Care (Signed)
  Problem: Education: Goal: Verbalization of understanding the information provided (i.e., activity precautions, restrictions, etc) will improve Outcome: Progressing Goal: Individualized Educational Video(s) Outcome: Progressing   Problem: Activity: Goal: Ability to ambulate and perform ADLs will improve Outcome: Progressing   Problem: Pain Management: Goal: Pain level will decrease Outcome: Progressing   Problem: Education: Goal: Knowledge of General Education information will improve Description: Including pain rating scale, medication(s)/side effects and non-pharmacologic comfort measures Outcome: Progressing   Problem: Clinical Measurements: Goal: Ability to maintain clinical measurements within normal limits will improve Outcome: Progressing Goal: Will remain free from infection Outcome: Progressing

## 2020-04-21 NOTE — Progress Notes (Addendum)
Orthopaedic Trauma Progress Note  S: Doing OK, tired this morning. Had difficulty sleeping last night, asking for something to help with sleeping. Will add melatonin, already on Tylenol.  Discouraged after working with therapies yesterday.  Wife and daughter at bedside. No specific concerns or questions currently.   O:  Vitals:   04/21/20 0741 04/21/20 0823  BP: 124/68   Pulse: 77   Resp: 14   Temp: 98 F (36.7 C)   SpO2: 92% 94%    General: Sitting up in bed, NAD. Pleasant and cooperative Respiratory: No increased work of breathing. Left Lower Extremity: Dressings removed, incisions are clean, dry, intact.  No significant tenderness with palpation over the hip and lateral thigh.  Tolerates very small amount of passive knee flexion.  Ankle dorsiflexion plantarflexion is intact.  Extremity is warm  Imaging: Stable post op imaging.   Labs:  Results for orders placed or performed during the hospital encounter of 04/18/20 (from the past 24 hour(s))  Glucose, capillary     Status: Abnormal   Collection Time: 04/20/20 12:24 PM  Result Value Ref Range   Glucose-Capillary 263 (H) 70 - 99 mg/dL  Urinalysis, Routine w reflex microscopic     Status: Abnormal   Collection Time: 04/20/20 12:52 PM  Result Value Ref Range   Color, Urine RED (A) YELLOW   APPearance TURBID (A) CLEAR   Specific Gravity, Urine  1.005 - 1.030    TEST NOT REPORTED DUE TO COLOR INTERFERENCE OF URINE PIGMENT   pH  5.0 - 8.0    TEST NOT REPORTED DUE TO COLOR INTERFERENCE OF URINE PIGMENT   Glucose, UA (A) NEGATIVE mg/dL    TEST NOT REPORTED DUE TO COLOR INTERFERENCE OF URINE PIGMENT   Hgb urine dipstick (A) NEGATIVE    TEST NOT REPORTED DUE TO COLOR INTERFERENCE OF URINE PIGMENT   Bilirubin Urine (A) NEGATIVE    TEST NOT REPORTED DUE TO COLOR INTERFERENCE OF URINE PIGMENT   Ketones, ur (A) NEGATIVE mg/dL    TEST NOT REPORTED DUE TO COLOR INTERFERENCE OF URINE PIGMENT   Protein, ur (A) NEGATIVE mg/dL    TEST NOT  REPORTED DUE TO COLOR INTERFERENCE OF URINE PIGMENT   Nitrite (A) NEGATIVE    TEST NOT REPORTED DUE TO COLOR INTERFERENCE OF URINE PIGMENT   Leukocytes,Ua (A) NEGATIVE    TEST NOT REPORTED DUE TO COLOR INTERFERENCE OF URINE PIGMENT  Urinalysis, Microscopic (reflex)     Status: Abnormal   Collection Time: 04/20/20 12:52 PM  Result Value Ref Range   RBC / HPF >50 0 - 5 RBC/hpf   WBC, UA 6-10 0 - 5 WBC/hpf   Bacteria, UA FEW (A) NONE SEEN   Squamous Epithelial / LPF 0-5 0 - 5   Mucus PRESENT    Urine-Other MICROSCOPIC EXAM PERFORMED ON UNCONCENTRATED URINE   Glucose, capillary     Status: Abnormal   Collection Time: 04/20/20  4:36 PM  Result Value Ref Range   Glucose-Capillary 245 (H) 70 - 99 mg/dL  Glucose, capillary     Status: Abnormal   Collection Time: 04/20/20  8:29 PM  Result Value Ref Range   Glucose-Capillary 260 (H) 70 - 99 mg/dL  CBC     Status: Abnormal   Collection Time: 04/21/20  5:04 AM  Result Value Ref Range   WBC 9.1 4.0 - 10.5 K/uL   RBC 2.41 (L) 4.22 - 5.81 MIL/uL   Hemoglobin 8.0 (L) 13.0 - 17.0 g/dL   HCT 24.6 (  L) 39 - 52 %   MCV 102.1 (H) 80.0 - 100.0 fL   MCH 33.2 26.0 - 34.0 pg   MCHC 32.5 30.0 - 36.0 g/dL   RDW 12.4 11.5 - 15.5 %   Platelets 183 150 - 400 K/uL   nRBC 0.0 0.0 - 0.2 %  Glucose, capillary     Status: Abnormal   Collection Time: 04/21/20  7:29 AM  Result Value Ref Range   Glucose-Capillary 268 (H) 70 - 99 mg/dL    Assessment: 86 year old male with previous left hip arthroplasty status post fall, 2 Days Post-Op   Injuries: Left periprosthetic femur fracture s/p ORIF  Weightbearing: TDWB LLE  Insicional and dressing care: Okay to leave open to air, change PRN with mepliex wall  Showering: Okay to begin showering with assistance on 04/22/2020  Orthopedic device(s): None   CV/Blood loss: Acute blood loss anemia, Hgb 8.0 this morning. Hemodynamically stable. Continue to monitor CBC  Pain management:  1. Tylenol 325-650 mg q 6 hours  PRN 2. Robaxin 500 mg q 6 hours PRN 3. Norco 5-325 q 6 hours PRN moderate pain 4. Norco 7.5-325 q 6 hours PRN severe pain 5. Dilaudid 1 mg q 3 hours PRN  VTE prophylaxis: Lovenox, SCDs  ID:  Ancef 2gm post op completed  Foley/Lines:  Foley currently in place, [facility] IVFs  Medical co-morbidities: nonobstructive CAD, hypertension, hyperlipidemia, diverticulosis, non-insulin-dependent diabetes type 2, COPD on room air, CKD 3B, heart failure with preserved ejection fraction(  Impediments to Fracture Healing: diabetes.  Vitamin D level looks okay at 30.  No need for supplementation currently  Dispo: Up with therapies as tolerated, PT/OT recommending CIR.  CIR admissions coordinator following and will start insurance authorization.  If not accepted will likely need SNF.   Follow - up plan: 2 weeks  Contact information:  Katha Hamming MD, Patrecia Pace PA-C   [facility] [phone] (office) [email or web address]

## 2020-04-21 NOTE — Progress Notes (Signed)
Physical Therapy Treatment Patient Details Name: [name] Small MRN: [id number] DOB: [birth year] Today's Date: 04/21/2020    History of Present Illness Pt is an 86 year old man admitted with L hip fx on 04/18/20 after falling. Underwent ORIF on 04/19/20. PMH: COPD (per wife not 02 dependent), HTN, CAD, PVD, L transmet amputation, DM, afib, CHF, CKD, renal disease and anxiety.    PT Comments    Pt supine in bed on arrival.  He is motivated to progress mobility.  Daughter and wife present at bedside.  Pt pre-medicated for session.  Pt able to move from bed to recliner with +2 external assistance.  Continue to recommend rehab in a post acute setting.     Follow Up Recommendations  CIR;Supervision/Assistance - 24 hour     Equipment Recommendations  None recommended by PT    Recommendations for Other Services Rehab consult     Precautions / Restrictions Precautions Precautions: Fall Restrictions Weight Bearing Restrictions: Yes LLE Weight Bearing: Touchdown weight bearing    Mobility  Bed Mobility Overal bed mobility: Needs Assistance Bed Mobility: Supine to Sit     Supine to sit: Mod assist     General bed mobility comments: Pt required assistance to move into sitting.  Assistance to move B LEs to edge of bed and to elevate trunk into a seated position.  Once in sitting he required use of bed pad to scoot to edge of bed.  Transfers Overall transfer level: Needs assistance Equipment used: Rolling walker (2 wheeled);None Transfers: Sit to/from [employer] Sit to Stand: Max assist;+2 physical assistance         General transfer comment: Cues for hand placement and foot placement to rise into standing from elevated seated surface.  He was able to move into standing and maintain weight bearing but lacks strength to progress steps from bed to recliner.  Pt returned to seated position and required squat pivot from bed to  recliner.  Ambulation/Gait Ambulation/Gait assistance:  (NT unable.)               Stairs             Wheelchair Mobility    Modified Rankin (Stroke Patients Only)       Balance Overall balance assessment: Needs assistance   Sitting balance-Leahy Scale: Poor       Standing balance-Leahy Scale: Zero                              Cognition Arousal/Alertness: Awake/alert Behavior During Therapy: WFL for tasks assessed/performed Overall Cognitive Status: Difficult to assess                                        Exercises General Exercises - Lower Extremity Ankle Circles/Pumps: AROM;Both;20 reps;Supine Quad Sets: AROM;Both;10 reps;Supine Heel Slides: AROM;Both;10 reps;AAROM;Supine Hip ABduction/ADduction: AROM;AAROM;Both;10 reps;Supine    General Comments        Pertinent Vitals/Pain Pain Assessment: Faces Faces Pain Scale: Hurts little more Pain Location: L hip Pain Descriptors / Indicators: Grimacing;Guarding Pain Intervention(s): Premedicated before session;Repositioned    Home Living                      Prior Function            PT Goals (current goals can now be found in  the care plan section) Acute Rehab PT Goals Patient Stated Goal: to get stronger and return home Potential to Achieve Goals: Good Progress towards PT goals: Progressing toward goals    Frequency    Min 3X/week      PT Plan Current plan remains appropriate    Co-evaluation              AM-PAC PT "6 Clicks" Mobility   Outcome Measure  Help needed turning from your back to your side while in a flat bed without using bedrails?: A Little Help needed moving from lying on your back to sitting on the side of a flat bed without using bedrails?: A Lot Help needed moving to and from a bed to a chair (including a wheelchair)?: A Lot Help needed standing up from a chair using your arms (e.g., wheelchair or bedside chair)?: A  Lot Help needed to walk in hospital room?: Total Help needed climbing 3-5 steps with a railing? : Total 6 Click Score: 11    End of Session Equipment Utilized During Treatment: Gait belt Activity Tolerance: Patient limited by pain Patient left: in bed;with call bell/phone within reach;with bed alarm set;with family/visitor present Nurse Communication: Mobility status;Need for lift equipment PT Visit Diagnosis: Unsteadiness on feet (R26.81);Pain;Difficulty in walking, not elsewhere classified (R26.2) Pain - Right/Left: Left Pain - part of body: Hip     Time: 1440-1457 PT Time Calculation (min) (ACUTE ONLY): 17 min  Charges:  $Therapeutic Activity: 8-22 mins                     [name] Small , [name] Small Acute Rehabilitation Services Pager [phone] Office [phone]     [name] Small 04/21/2020, 3:13 PM

## 2020-04-21 NOTE — Consult Note (Signed)
Urology Consult  Consulting MD: Georgiann Mohs MD  CC: Gross hematuria  HPI: This is a 86year old male, previously seen at [facility] urology for urinary retention as well as history of high-grade nonmuscle invasive bladder cancer.  His last cystoscopy was in 2018.  At that time he had a small erythematous lesion that was to be followed up on repeat cystoscopy.  However, he has not followed up since that time.  He was admitted recently and underwent open reduction internal fixation of a left proximal femur fracture on 04/19/2020.  Patient had approximately 400 ml blood loss during that procedure.  Preoperative hemoglobin was 13.9, postop day #1, hemoglobin was 9.4.  The patient began having gross hematuria shortly after the procedure.  He has had continued gross hematuria with his hemoglobin this morning being 8.0.  Urologic consultation is requested.  The patient states that since 2018 he has not noted hematuria.  PMH: Past Medical History:  Diagnosis Date  . (HFpEF) heart failure with preserved ejection fraction ([place])   . Arthritis   . Back pain    s/p lumbar injection 2014  . BENIGN PROSTATIC HYPERTROPHY 05/21/2007  . Bladder cancer ([place]) 10/2011  . CAD (coronary artery disease)    nonobstructive by cath 6/12:  mid LAD 30%, proximal obtuse marginal-2 30%, proximal RCA 20%, mid RCA 30-40%.  He had normal cardiac output and mildly elevated filling pressures but no significant pulmonary hypertension;   Echocardiogram in May 2012 demonstrated EF 50-55% and left atrial enlargement   . Cellulitis of left foot    Hospitalized in 2006  . Chronic kidney disease (CKD), stage III (moderate) 12/04/2007   FOLLOWED BY DR PATEL  . Chronic pain of lower extremity   . Complication of anesthesia    1 time bladder cancer surgery- 2012 , oxygen satruratioon dropped had to stay overnight, no problems since then.  Marland Kitchen COPD (chronic obstructive pulmonary disease) ([place])   . DIABETES MELLITUS, TYPE II 05/21/2007  .  DIVERTICULOSIS, COLON 05/21/2007   pt denies  . Dyspnea    with exertion, patient mointors saturation  . Fatigue AGE-RELATED  . Gross hematuria    pt denies  . HYPERTENSION 05/21/2007  . Impaired hearing BILATERAL HEARING AIDS  . On home oxygen therapy    "2L at nighttime" (02/28/2016)  . PAD (peripheral artery disease) ([place])    a. 02/2016 L foot nonhealing ulcer-->Periph angio: L pop 100 w/ reconstitution via extensive vollats to the prox-mid peroneal (only patent vessel BK)-->PTA of L Pop and peroneal; b. Ongoing LE ischemia 5 & 06/2016 req amputation of toes on L foot.  . Pneumonia    Hx  of   . Psoriasis ELBOWS  . Psoriasis   . PSORIASIS, SCALP 10/25/2008    PSH: Past Surgical History:  Procedure Laterality Date  . AMPUTATION Left 03/27/2016   Procedure: partial first AMPUTATION RAY; LEFT;  Surgeon: Trula Slade, DPM;  Location: [place];  Service: Podiatry;  Laterality: Left;  . AMPUTATION Left 06/12/2016   Procedure: TRANSMETATARSAL AMPUTATION LEFT FOOT;  Surgeon: Newt Minion, MD;  Location: [place];  Service: Orthopedics;  Laterality: Left;  . APPENDECTOMY  1941  . CARDIAC CATHETERIZATION  04-24-11/  DR Rogue Jury ARIDA   MILD NONOBSTRUCTIVE CAD, NORMA CARDIAC OUTPUT  . CARDIOVASCULAR STRESS TEST  2007  . CATARACT EXTRACTION W/ INTRAOCULAR LENS  IMPLANT, BILATERAL Bilateral ~ 2010  . CYSTOSCOPY  12/25/2011   Procedure: CYSTOSCOPY;  Surgeon: Molli Hazard, MD;  Location: [place];  Service: Urology;  Laterality: N/A;  needs intubation and to be paralyzed   . CYSTOSCOPY W/ RETROGRADES  10/30/2011   Procedure: CYSTOSCOPY WITH RETROGRADE PYELOGRAM;  Surgeon: Molli Hazard, MD;  Location: [facility];  Service: Urology;  Laterality: Bilateral;  CYSTOSCOPY POSS TURBT BILATERAL RETROGRADE PYLEOGRAM   . CYSTOSCOPY W/ RETROGRADES  11/30/2012   Procedure: CYSTOSCOPY WITH RETROGRADE PYELOGRAM;  Surgeon: Molli Hazard, MD;  Location:  [facility];  Service: Urology;  Laterality: Bilateral;  Flexible cystoscopy.  . CYSTOSCOPY WITH BIOPSY  11/30/2012   Procedure: CYSTOSCOPY WITH BIOPSY;  Surgeon: Molli Hazard, MD;  Location: [facility];  Service: Urology;  Laterality: N/A;  . INCISION AND DRAINAGE FOOT Left ~ 2005   LEFT FOOT DUE TO INFECTION FROM  NAIL PUNCTURE INJURY  . LUMBAR LAMINECTOMY/DECOMPRESSION MICRODISCECTOMY N/A 10/18/2016   Procedure: LEFT AND CENTRAL L4-5 LUMBAR LAMINECTOMY WITH RESECTION OF SYNOVIAL CYST;  Surgeon: Jessy Oto, MD;  Location: [place];  Service: Orthopedics;  Laterality: N/A;  . ORIF FEMUR FRACTURE Left 04/19/2020   Procedure: OPEN REDUCTION INTERNAL FIXATION FEMORAL SHAFT FRACTURE;  Surgeon: Shona Needles, MD;  Location: [place];  Service: Orthopedics;  Laterality: Left;  . PENILE PROSTHESIS IMPLANT  1990s  . PERIPHERAL VASCULAR CATHETERIZATION N/A 02/14/2016   Procedure: Abdominal Aortogram w/Lower Extremity;  Surgeon: Wellington Hampshire, MD;  Location: [place] [facility];  Service: Cardiovascular;  Laterality: N/A;  . PERIPHERAL VASCULAR CATHETERIZATION Left 02/28/2016   Procedure: Peripheral Vascular Balloon Angioplasty;  Surgeon: Wellington Hampshire, MD;  Location: [place] [facility];  Service: Cardiovascular;  Laterality: Left;  left peroneal and popliteal artery  . [place]  . TOTAL HIP ARTHROPLASTY Left 12/27/2016   Procedure: LEFT TOTAL HIP ARTHROPLASTY ANTERIOR APPROACH;  Surgeon: Mcarthur Rossetti, MD;  Location: [facility];  Service: Orthopedics;  Laterality: Left;  . TRANSURETHRAL RESECTION OF BLADDER TUMOR  10/30/2011   Procedure: TRANSURETHRAL RESECTION OF BLADDER TUMOR (TURBT);  Surgeon: Molli Hazard, MD;  Location: [facility];  Service: Urology;  Laterality: N/A;  . TRANSURETHRAL RESECTION OF BLADDER TUMOR  12/25/2011   Procedure: TRANSURETHRAL RESECTION OF BLADDER TUMOR (TURBT);  Surgeon: Molli Hazard, MD;  Location: [facility];  Service: Urology;  Laterality: N/A;  need long gyrus instruments   . VASECTOMY  1990s    Allergies: Allergies  Allergen Reactions  . Actos [Pioglitazone Hydrochloride] Other (See Comments)    Held 2012 bladder cancer  . Aspirin Other (See Comments)    Held 2012 due to hematuria and bruising.   . Ace Inhibitors Cough  . Bactrim Rash and Other (See Comments)    Rash, presumed allergy  . Gabapentin Nausea And Vomiting    Upset stomach and diarrhea - tolerates low doses  . Lyrica [Pregabalin] Swelling    swelling  . Pravastatin Swelling    Swelling of feet  . Sulfa Drugs Cross Reactors Rash    Medications: Medications Prior to Admission  Medication Sig Dispense Refill Last Dose  . acetaminophen (TYLENOL) 500 MG tablet Take 1,000 mg by mouth every 8 (eight) hours as needed for mild pain, moderate pain or headache.   04/17/2020 at Unknown time  . amLODipine (NORVASC) 2.5 MG tablet Take 1 tablet (2.5 mg total) by mouth daily. 90 tablet 3 04/18/2020 at Unknown time  . Apremilast 30 MG TABS Take 1 tablet by mouth daily.  04/18/2020 at Unknown time  . atorvastatin (LIPITOR) 10 MG tablet TAKE 1 TABLET BY MOUTH  DAILY (Patient taking differently: Take 10 mg by mouth daily. ) 90 tablet 3 04/18/2020 at Unknown time  . budesonide-formoterol (SYMBICORT) 160-4.5 MCG/ACT inhaler Inhale 2 puffs into the lungs 2 (two) times daily as needed (shortness of breath).    Past Month at Unknown time  . clobetasol (TEMOVATE) 0.05 % external solution Apply 1 application topically daily as needed (rash).    Past Month at Unknown time  . clopidogrel (PLAVIX) 75 MG tablet TAKE 1 TABLET BY MOUTH  DAILY (Patient taking differently: Take 75 mg by mouth daily. ) 90 tablet 3 04/18/2020 at Unknown time  . furosemide (LASIX) 40 MG tablet TAKE 1 TABLET BY MOUTH  EVERY MONDAY, WEDNESDAY,  AND FRIDAY (Patient taking differently: Take 40 mg by mouth every Monday, Wednesday, and  Friday. ) 39 tablet 3 04/17/2020 at Unknown time  . glipiZIDE (GLUCOTROL XL) 5 MG 24 hr tablet TAKE 1 TABLET BY MOUTH  DAILY WITH BREAKFAST (Patient taking differently: Take 5 mg by mouth daily with breakfast. ) 90 tablet 3 04/18/2020 at Unknown time  . insulin glargine (LANTUS) 100 UNIT/ML injection Inject 0.15 mLs (15 Units total) into the skin daily. (Patient taking differently: Inject 15 Units into the skin at bedtime. ) 10 mL  04/17/2020 at Unknown time  . nystatin (MYCOSTATIN/NYSTOP) powder Apply to affected area twice a day (Patient taking differently: Apply 1 application topically 2 (two) times daily as needed (fungal infections). ) 30 g 0 Past Month at Unknown time  . tiotropium (SPIRIVA) 18 MCG inhalation capsule Place 1 capsule (18 mcg total) into inhaler and inhale daily. 30 capsule 12 04/18/2020 at Unknown time  . escitalopram (LEXAPRO) 5 MG tablet Take 1 tablet (5 mg total) by mouth daily. (Patient not taking: Reported on 01/31/2020) 90 tablet 1      Social History: Social History   Socioeconomic History  . Marital status: Married    Spouse name: Not on file  . Number of children: 4  . Years of education: Not on file  . Highest education level: Not on file  Occupational History  . Occupation: retired     Comment: [employer]	[place].  Tobacco Use  . Smoking status: Former Smoker    Packs/day: 2.00    Years: 22.00    Pack years: 44.00    Types: Cigarettes    Quit date: 11/11/1968    Years since quitting: 51.4  . Smokeless tobacco: Never Used  Substance and Sexual Activity  . Alcohol use: No    Comment: occassional- 10/17/16- none  in 4 months- "too sick"  . Drug use: No  . Sexual activity: Not Currently  Other Topics Concern  . Not on file  Social History Narrative   Retired Event organiser.   Active with golf.   [place] [phone].     Social Determinants of Health   Financial Resource Strain:   . Difficulty of Paying Living Expenses:   Food Insecurity:   .  Worried About Charity fundraiser in the Last Year:   . Arboriculturist in the Last Year:   Transportation Needs:   . Film/video editor (Medical):   Marland Kitchen Lack of Transportation (Non-Medical):   Physical Activity:   . Days of Exercise per Week:   . Minutes of Exercise per Session:   Stress:   . Feeling of Stress :   Social  Connections:   . Frequency of Communication with Friends and Family:   . Frequency of Social Gatherings with Friends and Family:   . Attends Religious Services:   . Active Member of Clubs or Organizations:   . Attends Archivist Meetings:   Marland Kitchen Marital Status:   Intimate Partner Violence:   . Fear of Current or Ex-Partner:   . Emotionally Abused:   Marland Kitchen Physically Abused:   . Sexually Abused:     Family History: Family History  Problem Relation Age of Onset  . Diabetes Mother   . Heart disease Brother   . Heart disease Brother   . Heart disease Brother   . Heart disease Brother   . Heart disease Brother   . Heart disease Brother   . Lung cancer Brother     Review of Systems: Positive: Gross hematuria Negative:   A further 10 point review of systems was negative except what is listed in the HPI.  Physical Exam: @VITALS2 @ General: No acute distress.  Awake. Head:  Normocephalic.  Atraumatic. ENT:  EOMI.  Mucous membranes moist Neck:  Supple.  No lymphadenopathy. CV:  Regular rate. Pulmonary: Equal effort bilaterally.   Abdomen: Soft.  Non-tender to palpation. Skin:  Normal turgor.  No visible rash. Extremity: No gross deformity of upper extremities.  No gross deformity of lower extremities. Neurologic: Alert. Appropriate mood.  Penis:  Circumcised.  No lesions.  Foley catheter present. Urethra: Orthotopic meatus. Scrotum: No lesions.  No ecchymosis.  No erythema. Testicles: Descended bilaterally.  No masses bilaterally.   Studies:  Recent Labs    04/20/20 0417 04/21/20 0504  HGB 9.4* 8.0*  WBC 11.4* 9.1  PLT 163 183    No  results for input(s): NA, K, CL, CO2, BUN, CREATININE, CALCIUM, GFRNONAA, GFRAA in the last 72 hours.  Invalid input(s): MAGNESIUM   No results for input(s): INR, APTT in the last 72 hours.  Invalid input(s): PT   Invalid input(s): ABG  Through his indwelling 22 [place] coud catheter, I copiously irrigated a fair amount of clot.  2 L of saline were used for irrigation.  Following irrigation, the irrigant was very light pink.  The patient tolerated procedure well.  Assessment/Plan: Gross hematuria.  With the amount of clot he had in his bladder (perhaps 150 mL), I sincerely doubt that the hematuria is accounting for the amount of drop in his hemoglobin.  However, this may be ongoing for some time.  Unfortunately, the Lovenox is not helping his situation and may perpetuate his hematuria.  To help out, I have written a order to have his catheter irrigated with the [place] syringe every 2 hours.  This should be done vigorously to liberate any clots.  Liberating/removing the clots will significantly decrease the bleeding.  I would suggest continuing daily hemoglobins.  I will follow with you.  If this continues, may need CBI, a larger three-way catheter, and consideration for taking him off of the Lovenox.      Pager:[phone]

## 2020-04-21 NOTE — Progress Notes (Signed)
[facility]  PROGRESS NOTE  [name] Small STM:[id number] DOB: 08-04-1928 DOA: 04/18/2020 PCP: Tonia Ghent, MD   Brief HPI:   86 year old male with medical history of nonobstructive CAD, hypertension, hyperlipidemia, diverticulosis, non-insulin-dependent diabetes type 2, COPD on room air, CKD stage IIIb, heart failure with preserved ejection fraction, arthritis status post left femur fracture and ORIF 2018 presented from home after mechanical fall stepping off a curb and missing his step, he fell onto his left side with left hip pain and rib pain.  Patient denied any syncope, presyncope, headache. In the ED imaging was remarkable for periprosthetic fracture, orthopedics was consulted and recommended transfer to [facility] for surgical repair.    Subjective   Patient seen and examined, s/p ORIF left periprosthetic hip fracture.  Patient continues to have hematuria, hemoglobin has dropped to 8.0.   Assessment/Plan:    1. S/p ORIF left proximal femur fracture-patient underwent ORIF for left proximal femur fracture, pain is well controlled.  Orthopedics following.  DVT prophylaxis per orthopedics.   2. Hematuria-patient developed hematuria, has a Foley catheter in place, likely traumatic from catheter insertion.  Hemoglobin has dropped significantly today to 8.0.  He has history of bladder cancer.  Will consult urology for further recommendations.  3. Acute blood loss anemia-patient's hemoglobin has dropped from 13.9-8.0 in 3 days.  Likely from acute blood loss anemia as well as ongoing hematuria as above.  Will closely follow patient's hemoglobin and transfuse for hemoglobin less than 7. 4. Hypertension-continue home medication daily amlodipine, furosemide is on hold due to poor p.o. intake. 5. CKD stage IIIb-creatinine is 1.93 better than yesterday, follow BMP in a.m. 6. Hyperlipidemia-continue atorvastatin 7. Diabetes mellitus type 2-hold p.o. medications.  Continue sliding scale  insulin with NovoLog.  Will restart Lantus 15 units subcu daily. 8. COPD-not in exacerbation, continue Breo     Lab Results  Component Value Date   [place] NEGATIVE 04/18/2020     CBG: Recent Labs  Lab 04/20/20 1224 04/20/20 1636 04/20/20 2029 04/21/20 0729 04/21/20 1127  GLUCAP 263* 245* 260* 268* 232*    CBC: Recent Labs  Lab 04/18/20 1136 04/20/20 0417 04/21/20 0504  WBC 8.0 11.4* 9.1  NEUTROABS 5.6  --   --   HGB 13.9 9.4* 8.0*  HCT 43.0 29.7* 24.6*  MCV 101.4* 103.8* 102.1*  PLT 221 163 892    Basic Metabolic Panel: Recent Labs  Lab 04/18/20 1136  NA 138  K 5.1  CL 98  CO2 29  GLUCOSE 271*  BUN 32*  CREATININE 1.93*  CALCIUM 8.4*        DVT prophylaxis: Heparin  Code Status: Full code  Family Communication: No family at bedside    Status is: Inpatient  Dispo: The patient is from: Home              Anticipated d/c is to: Skilled nursing facility              Anticipated d/c date is: 04/22/2020              Patient currently not medically stable for discharge  Barrier to discharge-ongoing hematuria, s/p ORIF for periprosthetic fracture        Scheduled medications:  . amLODipine  2.5 mg Oral Daily  . atorvastatin  10 mg Oral Daily  . Chlorhexidine Gluconate Cloth  6 each Topical Daily  . enoxaparin (LOVENOX) injection  40 mg Subcutaneous Q24H  . escitalopram  5 mg Oral Daily  . fluticasone  furoate-vilanterol  1 puff Inhalation Daily  . insulin aspart  0-5 Units Subcutaneous QHS  . insulin aspart  0-9 Units Subcutaneous TID WC  . insulin glargine  15 Units Subcutaneous QHS  . lidocaine  1 application Urethral Once  . senna  1 tablet Oral BID  . umeclidinium bromide  1 puff Inhalation Daily    Consultants:  Orthopedics  Procedures:    Antibiotics:   Anti-infectives (From admission, onward)   Start     Dose/Rate Route Frequency Ordered Stop   04/19/20 2200  ceFAZolin (ANCEF) IVPB 2g/100 mL premix        2  g 200 mL/hr over 30 Minutes Intravenous Every 8 hours 04/19/20 2026 04/20/20 1408   04/19/20 1638  vancomycin (VANCOCIN) powder  Status:  Discontinued          As needed 04/19/20 1638 04/19/20 1747   04/19/20 1500  ceFAZolin (ANCEF) IVPB 2g/100 mL premix        2 g 200 mL/hr over 30 Minutes Intravenous  Once 04/19/20 1446 04/19/20 1551   04/19/20 1458  ceFAZolin (ANCEF) 2-4 GM/100ML-% IVPB       Note to Pharmacy: Gleason, Ginger   : cabinet override      04/19/20 1458 04/19/20 1530       Objective   Vitals:   04/20/20 1918 04/21/20 0257 04/21/20 0741 04/21/20 0823  BP: (!) 103/51 (!) 108/55 124/68   Pulse: 74 79 77   Resp: 16 17 14    Temp: 99.5 F (37.5 C) 98.2 F (36.8 C) 98 F (36.7 C)   TempSrc: Oral Oral Oral   SpO2: 99% 94% 92% 94%  Weight:      Height:        Intake/Output Summary (Last 24 hours) at 04/21/2020 1151 Last data filed at 04/21/2020 0900 Gross per 24 hour  Intake 900 ml  Output 1000 ml  Net -100 ml    06/09 1901 - 06/11 0700 In: 900 [P.O.:600] Out: 1500 [Urine:1500]  Filed Weights   04/19/20 0816  Weight: 94.9 kg    Physical Examination:     General-appears in no acute distress  Heart-S1-S2, regular, no murmur auscultated  Lungs-clear to auscultation bilaterally, no wheezing or crackles auscultated  Abdomen-soft, nontender, no organomegaly  Extremities-no edema in the lower extremities  Neuro-alert, oriented x3, no focal deficit noted    Data Reviewed:   Recent Results (from the past 240 hour(s))  Resp Panel by RT PCR (RSV, Flu A&B, Covid) - Nasopharyngeal Swab     Status: None   Collection Time: 04/18/20 11:36 AM   Specimen: Nasopharyngeal Swab  Result Value Ref Range Status   SARS Coronavirus 2 by RT PCR NEGATIVE NEGATIVE Final    Comment: (NOTE) SARS-CoV-2 target nucleic acids are NOT DETECTED. The SARS-CoV-2 RNA is generally detectable in upper respiratoy specimens during the acute phase of infection. The  lowest concentration of SARS-CoV-2 viral copies this assay can detect is 131 copies/mL. A negative result does not preclude SARS-Cov-2 infection and should not be used as the sole basis for treatment or other patient management decisions. A negative result may occur with  improper specimen collection/handling, submission of specimen other than nasopharyngeal swab, presence of viral mutation(s) within the areas targeted by this assay, and inadequate number of viral copies (<131 copies/mL). A negative result must be combined with clinical observations, patient history, and epidemiological information. The expected result is Negative. Fact Sheet for Patients:  PinkCheek.be Fact Sheet for Healthcare Providers:  GravelBags.it This test is not yet ap proved or cleared by the [place] and  has been authorized for detection and/or diagnosis of SARS-CoV-2 by FDA under an Emergency Use Authorization (EUA). This EUA will remain  in effect (meaning this test can be used) for the duration of the COVID-19 declaration under Section 564(b)(1) of the Act, 21 U.S.C. section 360bbb-3(b)(1), unless the authorization is terminated or revoked sooner.    Influenza A by PCR NEGATIVE NEGATIVE Final   Influenza B by PCR NEGATIVE NEGATIVE Final    Comment: (NOTE) The Xpert Xpress SARS-CoV-2/FLU/RSV assay is intended as an aid in  the diagnosis of influenza from Nasopharyngeal swab specimens and  should not be used as a sole basis for treatment. Nasal washings and  aspirates are unacceptable for Xpert Xpress SARS-CoV-2/FLU/RSV  testing. Fact Sheet for Patients: PinkCheek.be Fact Sheet for Healthcare Providers: GravelBags.it This test is not yet approved or cleared by the [place] FDA and  has been authorized for detection and/or diagnosis of SARS-CoV-2 by  FDA under an Emergency  Use Authorization (EUA). This EUA will remain  in effect (meaning this test can be used) for the duration of the  Covid-19 declaration under Section 564(b)(1) of the Act, 21  U.S.C. section 360bbb-3(b)(1), unless the authorization is  terminated or revoked.    Respiratory Syncytial Virus by PCR NEGATIVE NEGATIVE Final    Comment: (NOTE) Fact Sheet for Patients: PinkCheek.be Fact Sheet for Healthcare Providers: GravelBags.it This test is not yet approved or cleared by the [place] FDA and  has been authorized for detection and/or diagnosis of SARS-CoV-2 by  FDA under an Emergency Use Authorization (EUA). This EUA will remain  in effect (meaning this test can be used) for the duration of the  COVID-19 declaration under Section 564(b)(1) of the Act, 21 U.S.C.  section 360bbb-3(b)(1), unless the authorization is terminated or  revoked. Performed at [facility], [place] [address] [place], [place] [address]   MRSA PCR Screening     Status: None   Collection Time: 04/19/20  5:26 AM   Specimen: Nasal Mucosa; Nasopharyngeal  Result Value Ref Range Status   MRSA by PCR NEGATIVE NEGATIVE Final    Comment:        The GeneXpert MRSA Assay (FDA approved for NASAL specimens only), is one component of a comprehensive MRSA colonization surveillance program. It is not intended to diagnose MRSA infection nor to guide or monitor treatment for MRSA infections. Performed at [facility], [place] [address] [place],  [address]      Studies:  DG C-Arm 1-60 Min  Result Date: 04/19/2020 CLINICAL DATA:  Left femur repair. EXAM: LEFT FEMUR 2 VIEWS; DG C-ARM 1-60 MIN COMPARISON:  Preoperative radiograph yesterday FINDINGS: Eleven fluoroscopic spot views in the operating room obtained. Lateral plate with locking screws as well as cerclage wire fixation of periprosthetic femur fracture. Total fluoroscopy time is 93  seconds. Total dose 14.73 mGy IMPRESSION: Procedural fluoroscopy during periprosthetic femur fracture fixation. Electronically Signed   By: Keith Rake M.D.   On: 04/19/2020 19:37   DG FEMUR MIN 2 VIEWS LEFT  Result Date: 04/19/2020 CLINICAL DATA:  Left femur repair. EXAM: LEFT FEMUR 2 VIEWS; DG C-ARM 1-60 MIN COMPARISON:  Preoperative radiograph yesterday FINDINGS: Eleven fluoroscopic spot views in the operating room obtained. Lateral plate with locking screws as well as cerclage wire fixation of periprosthetic femur fracture. Total fluoroscopy time is 93 seconds. Total dose 14.73 mGy IMPRESSION:  Procedural fluoroscopy during periprosthetic femur fracture fixation. Electronically Signed   By: Keith Rake M.D.   On: 04/19/2020 19:37   DG FEMUR PORT MIN 2 VIEWS LEFT  Result Date: 04/19/2020 CLINICAL DATA:  Postop. EXAM: LEFT FEMUR PORTABLE 2 VIEWS COMPARISON:  Preoperative radiograph yesterday FINDINGS: Lateral plate and multi screw fixation of periprosthetic left proximal femur fracture. Additional cerclage wires. Left hip arthroplasty remains. Recent postsurgical change includes air and edema in the adjacent soft tissues. IMPRESSION: Lateral plate and screw fixation of periprosthetic left proximal femur fracture. Electronically Signed   By: Keith Rake M.D.   On: 04/19/2020 19:41     [place]   [facility] If 7PM-7AM, please contact night-coverage at [email or web address], Office  [phone]   04/21/2020, 11:51 AM  LOS: 3 days

## 2020-04-21 NOTE — Progress Notes (Signed)
RNs from 4th floor rehab came and inserted coude-tipped foley and irrigated bladder. Lidocaine was first applied and pt tolerated well. This RN reached out to consulting urologist for orders on how often and when to irrigate bladder. Will continue to monitor.

## 2020-04-21 NOTE — Progress Notes (Signed)
Inpatient Rehabilitation-Admissions Coordinator    Inpatient Rehab Consult received.  I met with pt and his wife at the bedside for rehabilitation assessment. Pt fatigued from today and slept through the majority of our conversation regarding rehab. Pt's wife very interested in this program and would like for [facility] to pursue. We discussed program details, expectations, anticipated LOS, and functional outcomes. Pt's wife confirmed DC support. We did discuss that with his diagnosis, pt's insurance may not approve but that we can try if it's her preference. AC will begin insurance authorization process for possible admit and will update once there had been a determination.   Raechel Ache, OTR/L  Rehab Admissions Coordinator  [phone] 04/21/2020 4:48 PM

## 2020-04-22 ENCOUNTER — Inpatient Hospital Stay (HOSPITAL_COMMUNITY): Payer: Medicare Other

## 2020-04-22 LAB — BASIC METABOLIC PANEL
Anion gap: 11 (ref 5–15)
BUN: 60 mg/dL — ABNORMAL HIGH (ref 8–23)
CO2: 25 mmol/L (ref 22–32)
Calcium: 7.8 mg/dL — ABNORMAL LOW (ref 8.9–10.3)
Chloride: 99 mmol/L (ref 98–111)
Creatinine, Ser: 2.71 mg/dL — ABNORMAL HIGH (ref 0.61–1.24)
GFR calc Af Amer: 23 mL/min — ABNORMAL LOW (ref >60)
GFR calc non Af Amer: 20 mL/min — ABNORMAL LOW (ref >60)
Glucose, Bld: 168 mg/dL — ABNORMAL HIGH (ref 70–99)
Potassium: 4.9 mmol/L (ref 3.5–5.1)
Sodium: 135 mmol/L (ref 135–145)

## 2020-04-22 LAB — CBC
HCT: 23.2 % — ABNORMAL LOW (ref 39.0–52.0)
Hemoglobin: 7.5 g/dL — ABNORMAL LOW (ref 13.0–17.0)
MCH: 32.6 pg (ref 26.0–34.0)
MCHC: 32.3 g/dL (ref 30.0–36.0)
MCV: 100.9 fL — ABNORMAL HIGH (ref 80.0–100.0)
Platelets: 205 10*3/uL (ref 150–400)
RBC: 2.3 MIL/uL — ABNORMAL LOW (ref 4.22–5.81)
RDW: 12.4 % (ref 11.5–15.5)
WBC: 9.4 10*3/uL (ref 4.0–10.5)
nRBC: 0 % (ref 0.0–0.2)

## 2020-04-22 LAB — GLUCOSE, CAPILLARY
Glucose-Capillary: 164 mg/dL — ABNORMAL HIGH (ref 70–99)
Glucose-Capillary: 249 mg/dL — ABNORMAL HIGH (ref 70–99)
Glucose-Capillary: 216 mg/dL — ABNORMAL HIGH (ref 70–99)
Glucose-Capillary: 294 mg/dL — ABNORMAL HIGH (ref 70–99)

## 2020-04-22 MED ORDER — SODIUM CHLORIDE 0.9 % IV SOLN: INTRAVENOUS | Status: DC

## 2020-04-22 MED ORDER — SODIUM CHLORIDE 0.9 % IR SOLN
3000.0000 mL | Status: DC
  Administered 2020-04-22: 3000 mL

## 2020-04-22 MED ORDER — ONDANSETRON HCL 4 MG/2ML IJ SOLN
4.0000 mg | Freq: Four times a day (QID) | INTRAMUSCULAR | Status: DC | PRN
  Administered 2020-04-22: 4 mg via INTRAVENOUS
  Filled 2020-04-22: qty 2

## 2020-04-22 NOTE — Plan of Care (Signed)
  Problem: Pain Managment: Goal: General experience of comfort will improve Outcome: Progressing   Problem: Safety: Goal: Ability to remain free from injury will improve Outcome: Progressing   Problem: Skin Integrity: Goal: Risk for impaired skin integrity will decrease Outcome: Progressing   

## 2020-04-22 NOTE — Progress Notes (Signed)
[facility]  PROGRESS NOTE  [name] Small:[id number] DOB: 05-26-1928 DOA: 04/18/2020 PCP: Tonia Ghent, MD   Brief HPI:   86 year old male with medical history of nonobstructive CAD, hypertension, hyperlipidemia, diverticulosis, non-insulin-dependent diabetes type 2, COPD on room air, CKD stage IIIb, heart failure with preserved ejection fraction, arthritis status post left femur fracture and ORIF 2018 presented from home after mechanical fall stepping off a curb and missing his step, he fell onto his left side with left hip pain and rib pain.  Patient denied any syncope, presyncope, headache. In the ED imaging was remarkable for periprosthetic fracture, orthopedics was consulted and recommended transfer to [facility] for surgical repair.    Subjective   Patient seen and examined, continues to have hematuria.  Appreciate urology input.  Urology recommending to stop Lovenox for ongoing hematuria.   Assessment/Plan:    1. S/p ORIF left proximal femur fracture-patient underwent ORIF for left proximal femur fracture, pain is well controlled.  Orthopedics following.  D 2. Hematuria-patient developed hematuria, has a Foley catheter in place, likely traumatic from catheter insertion.  Hemoglobin has dropped significantly today to 8.0.  He has history of bladder cancer.  Urology has seen the patient, continues to have hematuria despite bladder irrigation.  Urology recommending to hold Lovenox.  Will hold Lovenox as patient continues to have significant hematuria.   3. Acute blood loss anemia-patient's hemoglobin has dropped from 13.9-8.0 in 3 days.  Likely from acute blood loss anemia as well as ongoing hematuria as above.  Will closely follow patient's hemoglobin and transfuse for hemoglobin less than 7.  Follow CBC today. 4. Hypertension-continue home medication daily amlodipine, furosemide is on hold due to poor p.o. intake. 5. Acute kidney injury on CKD stage IIIb-creatinine is  2.71, worse from 1.93 as of 04/18/2020.  Likely postobstructive from hematuria/blood clot.  Will obtain renal ultrasound.  Urology already following.   6. Hyperlipidemia-continue atorvastatin 7. Diabetes mellitus type 2-hold p.o. medications.  Continue sliding scale insulin with NovoLog.  Continue Lantus 15 units subcu daily. 8. COPD-not in exacerbation, continue Breo     Lab Results  Component Value Date   [place] NEGATIVE 04/18/2020     CBG: Recent Labs  Lab 04/21/20 1127 04/21/20 1628 04/21/20 2014 04/22/20 0758 04/22/20 1134  GLUCAP 232* 265* 220* 164* 249*    CBC: Recent Labs  Lab 04/18/20 1136 04/20/20 0417 04/21/20 0504  WBC 8.0 11.4* 9.1  NEUTROABS 5.6  --   --   HGB 13.9 9.4* 8.0*  HCT 43.0 29.7* 24.6*  MCV 101.4* 103.8* 102.1*  PLT 221 163 601    Basic Metabolic Panel: Recent Labs  Lab 04/18/20 1136 04/22/20 0620  NA 138 135  K 5.1 4.9  CL 98 99  CO2 29 25  GLUCOSE 271* 168*  BUN 32* 60*  CREATININE 1.93* 2.71*  CALCIUM 8.4* 7.8*        DVT prophylaxis: Hold Lovenox for ongoing hematuria, SCDs  Code Status: Full code  Family Communication: No family at bedside    Status is: Inpatient  Dispo: The patient is from: Home              Anticipated d/c is to: Skilled nursing facility              Anticipated d/c date is: 04/24/2020              Patient currently not medically stable for discharge  Barrier to discharge-ongoing hematuria, s/p ORIF for periprosthetic  fracture      Scheduled medications:  . amLODipine  2.5 mg Oral Daily  . atorvastatin  10 mg Oral Daily  . Chlorhexidine Gluconate Cloth  6 each Topical Daily  . enoxaparin (LOVENOX) injection  40 mg Subcutaneous Q24H  . escitalopram  5 mg Oral Daily  . fluticasone furoate-vilanterol  1 puff Inhalation Daily  . insulin aspart  0-5 Units Subcutaneous QHS  . insulin aspart  0-9 Units Subcutaneous TID WC  . insulin glargine  15 Units Subcutaneous QHS  . senna  1 tablet  Oral BID  . umeclidinium bromide  1 puff Inhalation Daily    Consultants:  Orthopedics  Procedures:  ORIF left periprosthetic hip fracture  Antibiotics:   Anti-infectives (From admission, onward)   Start     Dose/Rate Route Frequency Ordered Stop   04/19/20 2200  ceFAZolin (ANCEF) IVPB 2g/100 mL premix        2 g 200 mL/hr over 30 Minutes Intravenous Every 8 hours 04/19/20 2026 04/20/20 1408   04/19/20 1638  vancomycin (VANCOCIN) powder  Status:  Discontinued          As needed 04/19/20 1638 04/19/20 1747   04/19/20 1500  ceFAZolin (ANCEF) IVPB 2g/100 mL premix        2 g 200 mL/hr over 30 Minutes Intravenous  Once 04/19/20 1446 04/19/20 1551   04/19/20 1458  ceFAZolin (ANCEF) 2-4 GM/100ML-% IVPB       Note to Pharmacy: Gleason, Ginger   : cabinet override      04/19/20 1458 04/19/20 1530       Objective   Vitals:   04/21/20 1916 04/22/20 0256 04/22/20 0835 04/22/20 0844  BP: (!) 113/57 (!) 111/51 (!) 107/50   Pulse: (!) 58 65 73   Resp: 15 15 15    Temp: 98.2 F (36.8 C) 98.3 F (36.8 C) 98.4 F (36.9 C)   TempSrc: Oral Oral Oral   SpO2: 98% 98% 90% 95%  Weight:      Height:        Intake/Output Summary (Last 24 hours) at 04/22/2020 1200 Last data filed at 04/22/2020 0636 Gross per 24 hour  Intake 440 ml  Output 1400 ml  Net -960 ml    06/10 1901 - 06/12 0700 In: 800 [P.O.:800] Out: 1900 [Urine:1900]  Filed Weights   04/19/20 0816  Weight: 94.9 kg    Physical Examination:     General-appears in no acute distress  Heart-S1-S2, regular, no murmur auscultated  Lungs-clear to auscultation bilaterally, no wheezing or crackles auscultated  Abdomen-soft, nontender, no organomegaly  Extremities-no edema in the lower extremities  Neuro-alert, oriented x3, no focal deficit noted    Data Reviewed:   Recent Results (from the past 240 hour(s))  Resp Panel by RT PCR (RSV, Flu A&B, Covid) - Nasopharyngeal Swab     Status: None   Collection Time:  04/18/20 11:36 AM   Specimen: Nasopharyngeal Swab  Result Value Ref Range Status   SARS Coronavirus 2 by RT PCR NEGATIVE NEGATIVE Final    Comment: (NOTE) SARS-CoV-2 target nucleic acids are NOT DETECTED. The SARS-CoV-2 RNA is generally detectable in upper respiratoy specimens during the acute phase of infection. The lowest concentration of SARS-CoV-2 viral copies this assay can detect is 131 copies/mL. A negative result does not preclude SARS-Cov-2 infection and should not be used as the sole basis for treatment or other patient management decisions. A negative result may occur with  improper specimen collection/handling, submission of specimen other  than nasopharyngeal swab, presence of viral mutation(s) within the areas targeted by this assay, and inadequate number of viral copies (<131 copies/mL). A negative result must be combined with clinical observations, patient history, and epidemiological information. The expected result is Negative. Fact Sheet for Patients:  PinkCheek.be Fact Sheet for Healthcare Providers:  GravelBags.it This test is not yet ap proved or cleared by the [place] FDA and  has been authorized for detection and/or diagnosis of SARS-CoV-2 by FDA under an Emergency Use Authorization (EUA). This EUA will remain  in effect (meaning this test can be used) for the duration of the COVID-19 declaration under Section 564(b)(1) of the Act, 21 U.S.C. section 360bbb-3(b)(1), unless the authorization is terminated or revoked sooner.    Influenza A by PCR NEGATIVE NEGATIVE Final   Influenza B by PCR NEGATIVE NEGATIVE Final    Comment: (NOTE) The Xpert Xpress SARS-CoV-2/FLU/RSV assay is intended as an aid in  the diagnosis of influenza from Nasopharyngeal swab specimens and  should not be used as a sole basis for treatment. Nasal washings and  aspirates are unacceptable for Xpert Xpress SARS-CoV-2/FLU/RSV   testing. Fact Sheet for Patients: PinkCheek.be Fact Sheet for Healthcare Providers: GravelBags.it This test is not yet approved or cleared by the [place] FDA and  has been authorized for detection and/or diagnosis of SARS-CoV-2 by  FDA under an Emergency Use Authorization (EUA). This EUA will remain  in effect (meaning this test can be used) for the duration of the  Covid-19 declaration under Section 564(b)(1) of the Act, 21  U.S.C. section 360bbb-3(b)(1), unless the authorization is  terminated or revoked.    Respiratory Syncytial Virus by PCR NEGATIVE NEGATIVE Final    Comment: (NOTE) Fact Sheet for Patients: PinkCheek.be Fact Sheet for Healthcare Providers: GravelBags.it This test is not yet approved or cleared by the [place] FDA and  has been authorized for detection and/or diagnosis of SARS-CoV-2 by  FDA under an Emergency Use Authorization (EUA). This EUA will remain  in effect (meaning this test can be used) for the duration of the  COVID-19 declaration under Section 564(b)(1) of the Act, 21 U.S.C.  section 360bbb-3(b)(1), unless the authorization is terminated or  revoked. Performed at [facility], [place] [address], [place], [place] [address]   MRSA PCR Screening     Status: None   Collection Time: 04/19/20  5:26 AM   Specimen: Nasal Mucosa; Nasopharyngeal  Result Value Ref Range Status   MRSA by PCR NEGATIVE NEGATIVE Final    Comment:        The GeneXpert MRSA Assay (FDA approved for NASAL specimens only), is one component of a comprehensive MRSA colonization surveillance program. It is not intended to diagnose MRSA infection nor to guide or monitor treatment for MRSA infections. Performed at [facility], [place] [address] [place], [place] [address]       [facility] If 7PM-7AM, please  contact night-coverage at [email or web address], Office  [phone]   04/22/2020, 12:00 PM  LOS: 4 days

## 2020-04-22 NOTE — Plan of Care (Signed)
  Problem: Pain Management: Goal: Pain level will decrease Outcome: Progressing   Problem: Nutrition: Goal: Adequate nutrition will be maintained Outcome: Progressing

## 2020-04-22 NOTE — Progress Notes (Signed)
3 Days Post-Op Subjective: Patient seems a bit obtunded this morning.  He does not seem to be in any pain.  I am not quite sure if his bladder was irrigated as instructed last night.  Objective: Vital signs in last 24 hours: Temp:  [98.2 F (36.8 C)-98.3 F (36.8 C)] 98.3 F (36.8 C) (06/12 0256) Pulse Rate:  [58-88] 65 (06/12 0256) Resp:  [14-15] 15 (06/12 0256) BP: (111-123)/(51-72) 111/51 (06/12 0256) SpO2:  [94 %-98 %] 98 % (06/12 0256)  Intake/Output from previous day: 06/11 0701 - 06/12 0700 In: 800 [P.O.:800] Out: 1400 [Urine:1400] Intake/Output this shift: No intake/output data recorded.  Physical Exam:  Constitutional: Vital signs reviewed. WD WN in NAD   Eyes: PERRL, No scleral icterus.   Cardiovascular: RRR Pulmonary/Chest: Normal effort Extremities: No cyanosis or edema   Lab Results: Recent Labs    04/20/20 0417 04/21/20 0504  HGB 9.4* 8.0*  HCT 29.7* 24.6*   BMET Recent Labs    04/22/20 0620  NA 135  K 4.9  CL 99  CO2 25  GLUCOSE 168*  BUN 60*  CREATININE 2.71*  CALCIUM 7.8*   No results for input(s): LABPT, INR in the last 72 hours. No results for input(s): LABURIN in the last 72 hours. Results for orders placed or performed during the hospital encounter of 04/18/20  Resp Panel by RT PCR (RSV, Flu A&B, Covid) - Nasopharyngeal Swab     Status: None   Collection Time: 04/18/20 11:36 AM   Specimen: Nasopharyngeal Swab  Result Value Ref Range Status   SARS Coronavirus 2 by RT PCR NEGATIVE NEGATIVE Final    Comment: (NOTE) SARS-CoV-2 target nucleic acids are NOT DETECTED. The SARS-CoV-2 RNA is generally detectable in upper respiratoy specimens during the acute phase of infection. The lowest concentration of SARS-CoV-2 viral copies this assay can detect is 131 copies/mL. A negative result does not preclude SARS-Cov-2 infection and should not be used as the sole basis for treatment or other patient management decisions. A negative result may  occur with  improper specimen collection/handling, submission of specimen other than nasopharyngeal swab, presence of viral mutation(s) within the areas targeted by this assay, and inadequate number of viral copies (<131 copies/mL). A negative result must be combined with clinical observations, patient history, and epidemiological information. The expected result is Negative. Fact Sheet for Patients:  PinkCheek.be Fact Sheet for Healthcare Providers:  GravelBags.it This test is not yet ap proved or cleared by the [place] FDA and  has been authorized for detection and/or diagnosis of SARS-CoV-2 by FDA under an Emergency Use Authorization (EUA). This EUA will remain  in effect (meaning this test can be used) for the duration of the COVID-19 declaration under Section 564(b)(1) of the Act, 21 U.S.C. section 360bbb-3(b)(1), unless the authorization is terminated or revoked sooner.    Influenza A by PCR NEGATIVE NEGATIVE Final   Influenza B by PCR NEGATIVE NEGATIVE Final    Comment: (NOTE) The Xpert Xpress SARS-CoV-2/FLU/RSV assay is intended as an aid in  the diagnosis of influenza from Nasopharyngeal swab specimens and  should not be used as a sole basis for treatment. Nasal washings and  aspirates are unacceptable for Xpert Xpress SARS-CoV-2/FLU/RSV  testing. Fact Sheet for Patients: PinkCheek.be Fact Sheet for Healthcare Providers: GravelBags.it This test is not yet approved or cleared by the [place] FDA and  has been authorized for detection and/or diagnosis of SARS-CoV-2 by  FDA under an Emergency Use Authorization (EUA). This EUA will  remain  in effect (meaning this test can be used) for the duration of the  Covid-19 declaration under Section 564(b)(1) of the Act, 21  U.S.C. section 360bbb-3(b)(1), unless the authorization is  terminated or revoked.     Respiratory Syncytial Virus by PCR NEGATIVE NEGATIVE Final    Comment: (NOTE) Fact Sheet for Patients: PinkCheek.be Fact Sheet for Healthcare Providers: GravelBags.it This test is not yet approved or cleared by the [place] FDA and  has been authorized for detection and/or diagnosis of SARS-CoV-2 by  FDA under an Emergency Use Authorization (EUA). This EUA will remain  in effect (meaning this test can be used) for the duration of the  COVID-19 declaration under Section 564(b)(1) of the Act, 21 U.S.C.  section 360bbb-3(b)(1), unless the authorization is terminated or  revoked. Performed at [facility], [place] [address], [place], [place] [address]   MRSA PCR Screening     Status: None   Collection Time: 04/19/20  5:26 AM   Specimen: Nasal Mucosa; Nasopharyngeal  Result Value Ref Range Status   MRSA by PCR NEGATIVE NEGATIVE Final    Comment:        The GeneXpert MRSA Assay (FDA approved for NASAL specimens only), is one component of a comprehensive MRSA colonization surveillance program. It is not intended to diagnose MRSA infection nor to guide or monitor treatment for MRSA infections. Performed at [facility], [place] [address] [place], [place] [address]   I attempted to irrigate his catheter.  This was unsuccessful.  Studies/Results: No results found.  Assessment/Plan:   Gross hematuria following catheter placement.  History of bladder cancer.  Last cystoscopy 3 years ago.  Continues with hematuria.  I expect this to continue unless his Lovenox is stopped.    I will order a 20 [place] three-way hematuria catheter to be placed.  I will be back later to irrigate again.   LOS: 4 days   Jorja Loa 04/22/2020, 8:01 AM

## 2020-04-23 LAB — GLUCOSE, CAPILLARY
Glucose-Capillary: 223 mg/dL — ABNORMAL HIGH (ref 70–99)
Glucose-Capillary: 254 mg/dL — ABNORMAL HIGH (ref 70–99)
Glucose-Capillary: 229 mg/dL — ABNORMAL HIGH (ref 70–99)
Glucose-Capillary: 303 mg/dL — ABNORMAL HIGH (ref 70–99)

## 2020-04-23 LAB — HEMOGLOBIN AND HEMATOCRIT, BLOOD
HCT: 25.5 % — ABNORMAL LOW (ref 39.0–52.0)
Hemoglobin: 8.2 g/dL — ABNORMAL LOW (ref 13.0–17.0)

## 2020-04-23 LAB — BASIC METABOLIC PANEL
Anion gap: 6 (ref 5–15)
BUN: 45 mg/dL — ABNORMAL HIGH (ref 8–23)
CO2: 28 mmol/L (ref 22–32)
Calcium: 7.6 mg/dL — ABNORMAL LOW (ref 8.9–10.3)
Chloride: 101 mmol/L (ref 98–111)
Creatinine, Ser: 2.36 mg/dL — ABNORMAL HIGH (ref 0.61–1.24)
GFR calc Af Amer: 27 mL/min — ABNORMAL LOW (ref >60)
GFR calc non Af Amer: 24 mL/min — ABNORMAL LOW (ref >60)
Glucose, Bld: 237 mg/dL — ABNORMAL HIGH (ref 70–99)
Potassium: 4.7 mmol/L (ref 3.5–5.1)
Sodium: 135 mmol/L (ref 135–145)

## 2020-04-23 LAB — ABO/RH: ABO/RH(D): A POS

## 2020-04-23 LAB — CBC
HCT: 22.3 % — ABNORMAL LOW (ref 39.0–52.0)
Hemoglobin: 7.3 g/dL — ABNORMAL LOW (ref 13.0–17.0)
MCH: 33.8 pg (ref 26.0–34.0)
MCHC: 32.7 g/dL (ref 30.0–36.0)
MCV: 103.2 fL — ABNORMAL HIGH (ref 80.0–100.0)
Platelets: 217 10*3/uL (ref 150–400)
RBC: 2.16 MIL/uL — ABNORMAL LOW (ref 4.22–5.81)
RDW: 12.6 % (ref 11.5–15.5)
WBC: 9 10*3/uL (ref 4.0–10.5)
nRBC: 0 % (ref 0.0–0.2)

## 2020-04-23 LAB — PREPARE RBC (CROSSMATCH)

## 2020-04-23 MED ORDER — METHOCARBAMOL 500 MG PO TABS: 500.0000 mg | ORAL_TABLET | Freq: Four times a day (QID) | ORAL | 0 refills | Status: DC | PRN

## 2020-04-23 MED ORDER — HYDROCODONE-ACETAMINOPHEN 5-325 MG PO TABS: 1.0000 | ORAL_TABLET | Freq: Four times a day (QID) | ORAL | 0 refills | Status: DC | PRN

## 2020-04-23 MED ORDER — SODIUM CHLORIDE 0.9% IV SOLUTION: Freq: Once | INTRAVENOUS | Status: AC

## 2020-04-23 NOTE — Progress Notes (Signed)
[facility]  PROGRESS NOTE  [name] Small TWS:[id number] DOB: Dec 29, 1927 DOA: 04/18/2020 PCP: Tonia Ghent, MD   Brief HPI:   86 year old male with medical history of nonobstructive CAD, hypertension, hyperlipidemia, diverticulosis, non-insulin-dependent diabetes type 2, COPD on room air, CKD stage IIIb, heart failure with preserved ejection fraction, arthritis status post left femur fracture and ORIF 2018 presented from home after mechanical fall stepping off a curb and missing his step, he fell onto his left side with left hip pain and rib pain.  Patient denied any syncope, presyncope, headache. In the ED imaging was remarkable for periprosthetic fracture, orthopedics was consulted and recommended transfer to [facility] for surgical repair.    Subjective   Patient seen and examined, hematuria is clearing up.  Hemoglobin is down to 7.5 yesterday.  Urology help appreciated.   Assessment/Plan:    1. S/p ORIF left proximal femur fracture-patient underwent ORIF for left proximal femur fracture, pain is well controlled.  Orthopedics following.  D 2. Hematuria-patient developed hematuria, has a Foley catheter in place, likely traumatic from catheter insertion.  Hemoglobin has dropped significantly today to 8.0.  He has history of bladder cancer.  Urology has seen the patient, continues to have hematuria despite bladder irrigation.  Urology recommending to hold Lovenox.  Will hold Lovenox as patient continues to have significant hematuria.   3. Acute blood loss anemia-patient's hemoglobin has dropped from 13.9-7.5 in 4 days.  Likely from acute blood loss anemia as well as ongoing hematuria as above.  Will transfuse 1 unit PRBC.  Follow CBC in a.m. 4. Hypertension-continue home medication daily amlodipine, furosemide is on hold due to poor p.o. intake. 5. Acute kidney injury on CKD stage IIIb-creatinine is 2. 36, improved after starting IV fluids.  Worse from 1.93 as of 04/18/2020.  Renal  ultrasound shows no hydronephrosis.  Likely prerenal.  Continue IV fluids.  6. Hyperlipidemia-continue atorvastatin 7. Diabetes mellitus type 2-hold p.o. medications.  Continue sliding scale insulin with NovoLog.  Continue Lantus 15 units subcu daily. 8. COPD-not in exacerbation, continue Breo     Lab Results  Component Value Date   [place] NEGATIVE 04/18/2020     CBG: Recent Labs  Lab 04/22/20 0758 04/22/20 1134 04/22/20 1559 04/22/20 2029 04/23/20 0750  GLUCAP 164* 249* 216* 294* 223*    CBC: Recent Labs  Lab 04/18/20 1136 04/20/20 0417 04/21/20 0504 04/22/20 1209 04/23/20 0915  WBC 8.0 11.4* 9.1 9.4 9.0  NEUTROABS 5.6  --   --   --   --   HGB 13.9 9.4* 8.0* 7.5* 7.3*  HCT 43.0 29.7* 24.6* 23.2* 22.3*  MCV 101.4* 103.8* 102.1* 100.9* 103.2*  PLT 221 163 183 205 001    Basic Metabolic Panel: Recent Labs  Lab 04/18/20 1136 04/22/20 0620 04/23/20 0915  NA 138 135 135  K 5.1 4.9 4.7  CL 98 99 101  CO2 29 25 28   GLUCOSE 271* 168* 237*  BUN 32* 60* 45*  CREATININE 1.93* 2.71* 2.36*  CALCIUM 8.4* 7.8* 7.6*        DVT prophylaxis: Hold Lovenox for ongoing hematuria, SCDs  Code Status: Full code  Family Communication: No family at bedside    Status is: Inpatient  Dispo: The patient is from: Home              Anticipated d/c is to: Skilled nursing facility              Anticipated d/c date is: 04/24/2020  Patient currently not medically stable for discharge  Barrier to discharge-ongoing hematuria, s/p ORIF for periprosthetic fracture      Scheduled medications:  . amLODipine  2.5 mg Oral Daily  . atorvastatin  10 mg Oral Daily  . Chlorhexidine Gluconate Cloth  6 each Topical Daily  . escitalopram  5 mg Oral Daily  . fluticasone furoate-vilanterol  1 puff Inhalation Daily  . insulin aspart  0-5 Units Subcutaneous QHS  . insulin aspart  0-9 Units Subcutaneous TID WC  . insulin glargine  15 Units Subcutaneous QHS  . senna  1  tablet Oral BID  . umeclidinium bromide  1 puff Inhalation Daily    Consultants:  Orthopedics  Procedures:  ORIF left periprosthetic hip fracture  Antibiotics:   Anti-infectives (From admission, onward)   Start     Dose/Rate Route Frequency Ordered Stop   04/19/20 2200  ceFAZolin (ANCEF) IVPB 2g/100 mL premix        2 g 200 mL/hr over 30 Minutes Intravenous Every 8 hours 04/19/20 2026 04/20/20 1408   04/19/20 1638  vancomycin (VANCOCIN) powder  Status:  Discontinued          As needed 04/19/20 1638 04/19/20 1747   04/19/20 1500  ceFAZolin (ANCEF) IVPB 2g/100 mL premix        2 g 200 mL/hr over 30 Minutes Intravenous  Once 04/19/20 1446 04/19/20 1551   04/19/20 1458  ceFAZolin (ANCEF) 2-4 GM/100ML-% IVPB       Note to Pharmacy: Gleason, Ginger   : cabinet override      04/19/20 1458 04/19/20 1530       Objective   Vitals:   04/23/20 0342 04/23/20 0749 04/23/20 0906 04/23/20 1119  BP: (!) 120/51 (!) 111/50  (!) 105/47  Pulse: 65 66  70  Resp: 16 14  16   Temp: 97.7 F (36.5 C) 98.4 F (36.9 C)  98.6 F (37 C)  TempSrc: Oral Oral  Oral  SpO2: 99% 99% 98% 96%  Weight:      Height:        Intake/Output Summary (Last 24 hours) at 04/23/2020 1138 Last data filed at 04/23/2020 1120 Gross per 24 hour  Intake 675 ml  Output 7450 ml  Net -6775 ml    06/11 1901 - 06/13 0700 In: 440 [P.O.:440] Out: 8600 [Urine:8600]  Filed Weights   04/19/20 0816  Weight: 94.9 kg    Physical Examination:    General-appears in no acute distress Heart-S1-S2, regular, no murmur auscultated Lungs-clear to auscultation bilaterally, no wheezing or crackles auscultated Abdomen-soft, nontender, no organomegaly Extremities-no edema in the lower extremities Neuro-alert, oriented x3, no focal deficit noted   Data Reviewed:   Recent Results (from the past 240 hour(s))  Resp Panel by RT PCR (RSV, Flu A&B, Covid) - Nasopharyngeal Swab     Status: None   Collection Time: 04/18/20  11:36 AM   Specimen: Nasopharyngeal Swab  Result Value Ref Range Status   SARS Coronavirus 2 by RT PCR NEGATIVE NEGATIVE Final    Comment: (NOTE) SARS-CoV-2 target nucleic acids are NOT DETECTED. The SARS-CoV-2 RNA is generally detectable in upper respiratoy specimens during the acute phase of infection. The lowest concentration of SARS-CoV-2 viral copies this assay can detect is 131 copies/mL. A negative result does not preclude SARS-Cov-2 infection and should not be used as the sole basis for treatment or other patient management decisions. A negative result may occur with  improper specimen collection/handling, submission of specimen other than nasopharyngeal  swab, presence of viral mutation(s) within the areas targeted by this assay, and inadequate number of viral copies (<131 copies/mL). A negative result must be combined with clinical observations, patient history, and epidemiological information. The expected result is Negative. Fact Sheet for Patients:  PinkCheek.be Fact Sheet for Healthcare Providers:  GravelBags.it This test is not yet ap proved or cleared by the [place] FDA and  has been authorized for detection and/or diagnosis of SARS-CoV-2 by FDA under an Emergency Use Authorization (EUA). This EUA will remain  in effect (meaning this test can be used) for the duration of the COVID-19 declaration under Section 564(b)(1) of the Act, 21 U.S.C. section 360bbb-3(b)(1), unless the authorization is terminated or revoked sooner.    Influenza A by PCR NEGATIVE NEGATIVE Final   Influenza B by PCR NEGATIVE NEGATIVE Final    Comment: (NOTE) The Xpert Xpress SARS-CoV-2/FLU/RSV assay is intended as an aid in  the diagnosis of influenza from Nasopharyngeal swab specimens and  should not be used as a sole basis for treatment. Nasal washings and  aspirates are unacceptable for Xpert Xpress SARS-CoV-2/FLU/RSV   testing. Fact Sheet for Patients: PinkCheek.be Fact Sheet for Healthcare Providers: GravelBags.it This test is not yet approved or cleared by the [place] FDA and  has been authorized for detection and/or diagnosis of SARS-CoV-2 by  FDA under an Emergency Use Authorization (EUA). This EUA will remain  in effect (meaning this test can be used) for the duration of the  Covid-19 declaration under Section 564(b)(1) of the Act, 21  U.S.C. section 360bbb-3(b)(1), unless the authorization is  terminated or revoked.    Respiratory Syncytial Virus by PCR NEGATIVE NEGATIVE Final    Comment: (NOTE) Fact Sheet for Patients: PinkCheek.be Fact Sheet for Healthcare Providers: GravelBags.it This test is not yet approved or cleared by the [place] FDA and  has been authorized for detection and/or diagnosis of SARS-CoV-2 by  FDA under an Emergency Use Authorization (EUA). This EUA will remain  in effect (meaning this test can be used) for the duration of the  COVID-19 declaration under Section 564(b)(1) of the Act, 21 U.S.C.  section 360bbb-3(b)(1), unless the authorization is terminated or  revoked. Performed at [facility], [place] [address], [place], [place] [address]   MRSA PCR Screening     Status: None   Collection Time: 04/19/20  5:26 AM   Specimen: Nasal Mucosa; Nasopharyngeal  Result Value Ref Range Status   MRSA by PCR NEGATIVE NEGATIVE Final    Comment:        The GeneXpert MRSA Assay (FDA approved for NASAL specimens only), is one component of a comprehensive MRSA colonization surveillance program. It is not intended to diagnose MRSA infection nor to guide or monitor treatment for MRSA infections. Performed at [facility], [place] [address], [place],  [address]       [facility] If 7PM-7AM, please  contact night-coverage at [email or web address], Office  [phone]   04/23/2020, 11:38 AM  LOS: 5 days

## 2020-04-23 NOTE — Progress Notes (Signed)
4 Days Post-Op Subjective: Minimal hand irrigation needed   Objective: Vital signs in last 24 hours: Temp:  [97.6 F (36.4 C)-98.4 F (36.9 C)] 98.4 F (36.9 C) (06/13 0749) Pulse Rate:  [65-72] 66 (06/13 0749) Resp:  [14-16] 14 (06/13 0749) BP: (111-125)/(50-53) 111/50 (06/13 0749) SpO2:  [98 %-100 %] 98 % (06/13 0906)  Intake/Output from previous day: 06/12 0701 - 06/13 0700 In: 240 [P.O.:240] Out: 7450 [Urine:7450] Intake/Output this shift: Total I/O In: 120 [P.O.:120] Out: -   Physical Exam:  Constitutional: Vital signs reviewed. WD WN in NAD   Eyes: PERRL, No scleral icterus.   Cardiovascular: RRR Pulmonary/Chest: Normal effort: Extremities: No cyanosis or edema   Effluent clear pink  No clots  Lab Results: Recent Labs    04/21/20 0504 04/22/20 1209 04/23/20 0915  HGB 8.0* 7.5* 7.3*  HCT 24.6* 23.2* 22.3*   BMET Recent Labs    04/22/20 0620 04/23/20 0915  NA 135 135  K 4.9 4.7  CL 99 101  CO2 25 28  GLUCOSE 168* 237*  BUN 60* 45*  CREATININE 2.71* 2.36*  CALCIUM 7.8* 7.6*   No results for input(s): LABPT, INR in the last 72 hours. No results for input(s): LABURIN in the last 72 hours. Results for orders placed or performed during the hospital encounter of 04/18/20  Resp Panel by RT PCR (RSV, Flu A&B, Covid) - Nasopharyngeal Swab     Status: None   Collection Time: 04/18/20 11:36 AM   Specimen: Nasopharyngeal Swab  Result Value Ref Range Status   SARS Coronavirus 2 by RT PCR NEGATIVE NEGATIVE Final    Comment: (NOTE) SARS-CoV-2 target nucleic acids are NOT DETECTED. The SARS-CoV-2 RNA is generally detectable in upper respiratoy specimens during the acute phase of infection. The lowest concentration of SARS-CoV-2 viral copies this assay can detect is 131 copies/mL. A negative result does not preclude SARS-Cov-2 infection and should not be used as the sole basis for treatment or other patient management decisions. A negative result may occur  with  improper specimen collection/handling, submission of specimen other than nasopharyngeal swab, presence of viral mutation(s) within the areas targeted by this assay, and inadequate number of viral copies (<131 copies/mL). A negative result must be combined with clinical observations, patient history, and epidemiological information. The expected result is Negative. Fact Sheet for Patients:  PinkCheek.be Fact Sheet for Healthcare Providers:  GravelBags.it This test is not yet ap proved or cleared by the [place] FDA and  has been authorized for detection and/or diagnosis of SARS-CoV-2 by FDA under an Emergency Use Authorization (EUA). This EUA will remain  in effect (meaning this test can be used) for the duration of the COVID-19 declaration under Section 564(b)(1) of the Act, 21 U.S.C. section 360bbb-3(b)(1), unless the authorization is terminated or revoked sooner.    Influenza A by PCR NEGATIVE NEGATIVE Final   Influenza B by PCR NEGATIVE NEGATIVE Final    Comment: (NOTE) The Xpert Xpress SARS-CoV-2/FLU/RSV assay is intended as an aid in  the diagnosis of influenza from Nasopharyngeal swab specimens and  should not be used as a sole basis for treatment. Nasal washings and  aspirates are unacceptable for Xpert Xpress SARS-CoV-2/FLU/RSV  testing. Fact Sheet for Patients: PinkCheek.be Fact Sheet for Healthcare Providers: GravelBags.it This test is not yet approved or cleared by the [place] FDA and  has been authorized for detection and/or diagnosis of SARS-CoV-2 by  FDA under an Emergency Use Authorization (EUA). This EUA will remain  in effect (meaning this test can be used) for the duration of the  Covid-19 declaration under Section 564(b)(1) of the Act, 21  U.S.C. section 360bbb-3(b)(1), unless the authorization is  terminated or revoked.     Respiratory Syncytial Virus by PCR NEGATIVE NEGATIVE Final    Comment: (NOTE) Fact Sheet for Patients: PinkCheek.be Fact Sheet for Healthcare Providers: GravelBags.it This test is not yet approved or cleared by the [place] FDA and  has been authorized for detection and/or diagnosis of SARS-CoV-2 by  FDA under an Emergency Use Authorization (EUA). This EUA will remain  in effect (meaning this test can be used) for the duration of the  COVID-19 declaration under Section 564(b)(1) of the Act, 21 U.S.C.  section 360bbb-3(b)(1), unless the authorization is terminated or  revoked. Performed at [facility], [place] [address], [place], [place] [address]   MRSA PCR Screening     Status: None   Collection Time: 04/19/20  5:26 AM   Specimen: Nasal Mucosa; Nasopharyngeal  Result Value Ref Range Status   MRSA by PCR NEGATIVE NEGATIVE Final    Comment:        The GeneXpert MRSA Assay (FDA approved for NASAL specimens only), is one component of a comprehensive MRSA colonization surveillance program. It is not intended to diagnose MRSA infection nor to guide or monitor treatment for MRSA infections. Performed at [facility], [place] [address], [place], [place] [address]     Studies/Results: US RENAL  Result Date: 04/22/2020 CLINICAL DATA:  Acute renal injury EXAM: RENAL / URINARY TRACT ULTRASOUND COMPLETE COMPARISON:  05/18/2019 FINDINGS: Right Kidney: Renal measurements: 11.9 x 5.8 x 5.7 cm. = volume: 207 mL. Cortical thinning is noted. Increased echogenicity is seen as well. No hydronephrosis is noted. Left Kidney: Renal measurements: 12.4 x 6.1 x 6.4 cm. = volume: 254 mL. Cortical thinning with increased echogenicity is noted. No hydronephrosis is noted. Bladder: Decompressed by Foley catheter. Other: None. IMPRESSION: Changes consistent with medical renal disease. No acute abnormality is noted.  Electronically Signed   By: Inez Catalina M.D.   On: 04/22/2020 16:01    Assessment/Plan:   Hematuria--clearing  Will order cath removal for Mon AM and f/u after that   LOS: 5 days   Jorja Loa 04/23/2020, 10:45 AM

## 2020-04-24 ENCOUNTER — Inpatient Hospital Stay (HOSPITAL_COMMUNITY): Payer: Medicare Other

## 2020-04-24 ENCOUNTER — Encounter (HOSPITAL_COMMUNITY): Payer: Self-pay | Admitting: Internal Medicine

## 2020-04-24 DIAGNOSIS — I4891 Unspecified atrial fibrillation: Secondary | ICD-10-CM

## 2020-04-24 LAB — CBC
HCT: 25.3 % — ABNORMAL LOW (ref 39.0–52.0)
Hemoglobin: 8 g/dL — ABNORMAL LOW (ref 13.0–17.0)
MCH: 32.5 pg (ref 26.0–34.0)
MCHC: 31.6 g/dL (ref 30.0–36.0)
MCV: 102.8 fL — ABNORMAL HIGH (ref 80.0–100.0)
Platelets: 237 10*3/uL (ref 150–400)
RBC: 2.46 MIL/uL — ABNORMAL LOW (ref 4.22–5.81)
RDW: 13.2 % (ref 11.5–15.5)
WBC: 9.3 10*3/uL (ref 4.0–10.5)
nRBC: 0 % (ref 0.0–0.2)

## 2020-04-24 LAB — TYPE AND SCREEN
ABO/RH(D): A POS
Antibody Screen: NEGATIVE
Unit division: 0

## 2020-04-24 LAB — BASIC METABOLIC PANEL
Anion gap: 9 (ref 5–15)
BUN: 42 mg/dL — ABNORMAL HIGH (ref 8–23)
CO2: 25 mmol/L (ref 22–32)
Calcium: 7.7 mg/dL — ABNORMAL LOW (ref 8.9–10.3)
Chloride: 102 mmol/L (ref 98–111)
Creatinine, Ser: 1.98 mg/dL — ABNORMAL HIGH (ref 0.61–1.24)
GFR calc Af Amer: 34 mL/min — ABNORMAL LOW (ref >60)
GFR calc non Af Amer: 29 mL/min — ABNORMAL LOW (ref >60)
Glucose, Bld: 263 mg/dL — ABNORMAL HIGH (ref 70–99)
Potassium: 5.8 mmol/L — ABNORMAL HIGH (ref 3.5–5.1)
Sodium: 136 mmol/L (ref 135–145)

## 2020-04-24 LAB — BPAM RBC
Blood Product Expiration Date: 202106302359
ISSUE DATE / TIME: 202106131111
Unit Type and Rh: 6200

## 2020-04-24 LAB — GLUCOSE, CAPILLARY
Glucose-Capillary: 240 mg/dL — ABNORMAL HIGH (ref 70–99)
Glucose-Capillary: 232 mg/dL — ABNORMAL HIGH (ref 70–99)
Glucose-Capillary: 210 mg/dL — ABNORMAL HIGH (ref 70–99)
Glucose-Capillary: 257 mg/dL — ABNORMAL HIGH (ref 70–99)

## 2020-04-24 MED ORDER — SODIUM ZIRCONIUM CYCLOSILICATE 5 G PO PACK
5.0000 g | PACK | Freq: Once | ORAL | Status: AC
  Administered 2020-04-24: 5 g via ORAL
  Filled 2020-04-24: qty 1

## 2020-04-24 MED ORDER — ENSURE MAX PROTEIN PO LIQD
11.0000 [oz_av] | Freq: Every day | ORAL | Status: DC
  Administered 2020-04-24 – 2020-04-26 (×3): 11 [oz_av] via ORAL
  Filled 2020-04-24 (×3): qty 330

## 2020-04-24 MED ORDER — ADULT MULTIVITAMIN W/MINERALS CH
1.0000 | ORAL_TABLET | Freq: Every day | ORAL | Status: DC
  Administered 2020-04-24 – 2020-04-26 (×3): 1 via ORAL
  Filled 2020-04-24 (×3): qty 1

## 2020-04-24 MED ORDER — INSULIN GLARGINE 100 UNIT/ML ~~LOC~~ SOLN
20.0000 [IU] | Freq: Every day | SUBCUTANEOUS | Status: DC
  Administered 2020-04-24 – 2020-04-25 (×2): 20 [IU] via SUBCUTANEOUS
  Filled 2020-04-24 (×4): qty 0.2

## 2020-04-24 MED ORDER — GLUCERNA SHAKE PO LIQD
237.0000 mL | Freq: Two times a day (BID) | ORAL | Status: DC
  Administered 2020-04-24 – 2020-04-26 (×6): 237 mL via ORAL

## 2020-04-24 NOTE — Progress Notes (Addendum)
Inpatient Diabetes Program Recommendations  AACE/ADA: New Consensus Statement on Inpatient Glycemic Control (2015)  Target Ranges:  Prepandial:   less than 140 mg/dL      Peak postprandial:   less than 180 mg/dL (1-2 hours)      Critically ill patients:  140 - 180 mg/dL   Lab Results  Component Value Date   GLUCAP 240 (H) 04/24/2020   HGBA1C 9.3 (H) 04/19/2020    Review of Glycemic Control Results for AHMAAD, NEIDHARDT (MRN [id number]) as of 04/24/2020 10:17  Ref. Range 04/23/2020 07:50 04/23/2020 11:46 04/23/2020 16:15 04/23/2020 20:10 04/24/2020 08:04  Glucose-Capillary Latest Ref Range: 70 - 99 mg/dL 223 (H) 254 (H) 229 (H) 303 (H) 240 (H)   Diabetes history: DM 2 Outpatient Diabetes medications: Glipizide 5 mg QAM, Lantus 15 units QHS Current orders for Inpatient glycemic control:  Lantus 15 units qhs Novolog 0-9 units tid + hs  A1c 9.3% on 6/9  Inpatient Diabetes Program Recommendations:    Glucose trends consistently elevated on home dose of Levemir.  Based on Novolog doses received over the past 24 hours. Increase Lantus to 22 units.  Addendum 1125 am:  Spoke to pt and wife at bedside. Wife report pt eats extra fruit and snack occasionally. She reports "at his age he feels like he can do what he wants." Reviewed A1c goals. Discussed importance of glucose control on wound healing. Told pt to watch what he eats while he heals. Wife showed me glucose records in May/June where fasting glucose ranges from 76-170's. Pt reports only checking glucose once a day. Suggested for them to check glucose a second time later in the day.  Thanks,  Tama Headings RN, MSN, BC-ADM Inpatient Diabetes Coordinator Team Pager [phone] (8a-5p)

## 2020-04-24 NOTE — Progress Notes (Signed)
Physical Therapy Treatment Patient Details Name: [name] Small MRN: [id number] DOB: 02-Apr-1928 Today's Date: 04/24/2020    History of Present Illness Pt is an 86 year old man admitted with L hip fx on 04/18/20 after falling. Underwent ORIF on 04/19/20. PMH: COPD (per wife not 02 dependent), HTN, CAD, PVD, L transmet amputation, DM, afib, CHF, CKD, renal disease and anxiety.    PT Comments    Pt supine in bed on arrival this session.  He is very groggy from pain meds.  With mobility he did perk up a little more.  He required heavy mod assistance for all mobility with use of sara stedy to mobilize into standing and from surface to surface.  He lacks ability to stand upright with good posture.  He presents with lateral lean to the R.  Continue to recommend aggressive rehab at CIR to maximize functional gains before returning home.     Follow Up Recommendations  CIR;Supervision/Assistance - 24 hour     Equipment Recommendations  None recommended by PT    Recommendations for Other Services Rehab consult     Precautions / Restrictions Precautions Precautions: Fall Restrictions Weight Bearing Restrictions: Yes LLE Weight Bearing: Touchdown weight bearing Other Position/Activity Restrictions: Pt has difficulty maintaining weight bearing.    Mobility  Bed Mobility Overal bed mobility: Needs Assistance Bed Mobility: Supine to Sit     Supine to sit: Mod assist     General bed mobility comments: Mod assistance to advance LEs to edge of bed and elevate trunk into a seated position.  Utilized PTA as railing.  Pt required increased time and effort with HOB elevated.  Transfers Overall transfer level: Needs assistance Equipment used: Ambulation equipment used (sara stedy) Transfers: Sit to/from Stand Sit to Stand: Mod assist;From elevated surface         General transfer comment: Pt required assistance to rise into standing with max VCs for TDWB on RLE.  He remains unable to off  weight with UEs to progress to gt so utilized sara stedy for transfer OOB to chair.  Ambulation/Gait Ambulation/Gait assistance:  (NT)               Stairs             Wheelchair Mobility    Modified Rankin (Stroke Patients Only)       Balance Overall balance assessment: Needs assistance   Sitting balance-Leahy Scale: Poor   Postural control: Posterior lean   Standing balance-Leahy Scale: Zero                              Cognition Arousal/Alertness: Awake/alert Behavior During Therapy: WFL for tasks assessed/performed Overall Cognitive Status: Difficult to assess                                        Exercises      General Comments        Pertinent Vitals/Pain Pain Assessment: 0-10 Pain Score: 5  Pain Location: L hip Pain Descriptors / Indicators: Grimacing;Guarding Pain Intervention(s): Monitored during session;Repositioned    Home Living                      Prior Function            PT Goals (current goals can now be found in the care  plan section) Acute Rehab PT Goals Patient Stated Goal: to get stronger and return home Potential to Achieve Goals: Good Progress towards PT goals: Progressing toward goals    Frequency    Min 3X/week      PT Plan Current plan remains appropriate    Co-evaluation              AM-PAC PT "6 Clicks" Mobility   Outcome Measure  Help needed turning from your back to your side while in a flat bed without using bedrails?: A Little Help needed moving from lying on your back to sitting on the side of a flat bed without using bedrails?: A Lot Help needed moving to and from a bed to a chair (including a wheelchair)?: A Lot Help needed standing up from a chair using your arms (e.g., wheelchair or bedside chair)?: A Lot Help needed to walk in hospital room?: Total Help needed climbing 3-5 steps with a railing? : Total 6 Click Score: 11    End of Session  Equipment Utilized During Treatment: Gait belt Activity Tolerance: Patient limited by pain Patient left: in chair;with call bell/phone within reach;with chair alarm set;with family/visitor present Nurse Communication: Mobility status;Need for lift equipment PT Visit Diagnosis: Unsteadiness on feet (R26.81);Pain;Difficulty in walking, not elsewhere classified (R26.2) Pain - Right/Left: Left Pain - part of body: Hip     Time: 2446-2863 PT Time Calculation (min) (ACUTE ONLY): 32 min  Charges:  $Therapeutic Activity: 23-37 mins                     Erasmo Leventhal , PTA Acute Rehabilitation Services Pager [phone] Office [phone]     Raquan Iannone Eli Hose 04/24/2020, 4:39 PM

## 2020-04-24 NOTE — Progress Notes (Addendum)
[facility]  PROGRESS NOTE  [name] Small JJK:[id number] DOB: Apr 05, 1928 DOA: 04/18/2020 PCP: Tonia Ghent, MD   Brief HPI:   86 year old male with medical history of nonobstructive CAD, hypertension, hyperlipidemia, diverticulosis, non-insulin-dependent diabetes type 2, COPD on room air, CKD stage IIIb, heart failure with preserved ejection fraction, arthritis status post left femur fracture and ORIF 2018 presented from home after mechanical fall stepping off a curb and missing his step, he fell onto his left side with left hip pain and rib pain.  Patient denied any syncope, presyncope, headache. In the ED imaging was remarkable for periprosthetic fracture, orthopedics was consulted and recommended transfer to [facility] for surgical repair.    Subjective   Patient seen and examined, developed numbness of left hand.  Patient has history of bilateral hand numbness from neuropathy.  No other focal neurological deficit noted.  CT head ordered to rule out stroke.   Assessment/Plan:    1. S/p ORIF left proximal femur fracture-patient underwent ORIF for left proximal femur fracture, pain is well controlled.  Orthopedics following.   2. Left hand numbness-likely from neuropathy, patient has chronic numbness of both upper extremities.  No other neurological deficit noted.  Will obtain CT head to rule out stroke. 3. Hematuria-patient developed hematuria,  likely traumatic from catheter insertion.  Hemoglobin has dropped significantly today to 8.0.  S/p 1 unit PRBC.  He has history of bladder cancer.  Urology has seen the patient, patient was started on bladder irrigation.  Lovenox was discontinued due to ongoing hematuria.  Hematuria has now resolved.  Foley catheter has been removed.     4. Acute blood loss anemia-patient's hemoglobin has dropped from 13.9-7.5 in 4 days.  Likely from acute blood loss anemia as well as ongoing hematuria as above.  S/p 1 unit PRBC.  Repeat hemoglobin today is  8.0. 5. Hypertension-continue home medication daily amlodipine, furosemide is on hold due to poor p.o. intake. 6. Acute kidney injury on CKD stage IIIb-improved after starting IV fluids.  Today creatinine is 1.98.  Follow BMP in a.m.  Renal ultrasound showed no hydronephrosis.  Showed chronic medical renal disease. 7. Hyperlipidemia-continue atorvastatin 8. Diabetes mellitus type 2-hold p.o. medications.  Continue sliding scale insulin with NovoLog.  Blood glucose is elevated.  Will change Lantus 20 units subcu daily.   9. COPD-not in exacerbation, continue Breo 10. Hyperkalemia-potassium was 5.8, he is not on potassium supplementation.  Will give 1 dose of Lokelma.  Follow BMP in a.m. 11. DVT prophylaxis-called and discussed with Dr. [name] office PA .  Dr. [name]  recommended aspirin 325 mg p.o. twice daily for DVT prophylaxis.  But patient developed hematuria with aspirin in 2012.  We will discussed with Dr. [name], may not have many choices considering significant hematuria.  We will continue with SCDs for now.     Lab Results  Component Value Date   [place] NEGATIVE 04/18/2020     CBG: Recent Labs  Lab 04/23/20 1146 04/23/20 1615 04/23/20 2010 04/24/20 0804 04/24/20 1118  GLUCAP 254* 229* 303* 240* 232*    CBC: Recent Labs  Lab 04/18/20 1136 04/18/20 1136 04/20/20 0417 04/20/20 0417 04/21/20 0504 04/22/20 1209 04/23/20 0915 04/23/20 1646 04/24/20 0252  WBC 8.0   < > 11.4*  --  9.1 9.4 9.0  --  9.3  NEUTROABS 5.6  --   --   --   --   --   --   --   --   HGB 13.9   < >  9.4*   < > 8.0* 7.5* 7.3* 8.2* 8.0*  HCT 43.0   < > 29.7*   < > 24.6* 23.2* 22.3* 25.5* 25.3*  MCV 101.4*   < > 103.8*  --  102.1* 100.9* 103.2*  --  102.8*  PLT 221   < > 163  --  183 205 217  --  237   < > = values in this interval not displayed.    Basic Metabolic Panel: Recent Labs  Lab 04/18/20 1136 04/22/20 0620 04/23/20 0915 04/24/20 0252  NA 138 135 135 136  K 5.1 4.9 4.7 5.8*  CL  98 99 101 102  CO2 29 25 28 25   GLUCOSE 271* 168* 237* 263*  BUN 32* 60* 45* 42*  CREATININE 1.93* 2.71* 2.36* 1.98*  CALCIUM 8.4* 7.8* 7.6* 7.7*        DVT prophylaxis: Hold Lovenox for ongoing hematuria, SCDs  Code Status: Full code  Family Communication: No family at bedside    Status is: Inpatient  Dispo: The patient is from: Home              Anticipated d/c is to: Skilled nursing facility              Anticipated d/c date is: 04/25/2020              Patient currently not medically stable for discharge  Barrier to discharge-ongoing hematuria, s/p ORIF for periprosthetic fracture      Scheduled medications:  . amLODipine  2.5 mg Oral Daily  . atorvastatin  10 mg Oral Daily  . Chlorhexidine Gluconate Cloth  6 each Topical Daily  . escitalopram  5 mg Oral Daily  . feeding supplement (GLUCERNA SHAKE)  237 mL Oral BID BM  . fluticasone furoate-vilanterol  1 puff Inhalation Daily  . insulin aspart  0-5 Units Subcutaneous QHS  . insulin aspart  0-9 Units Subcutaneous TID WC  . insulin glargine  15 Units Subcutaneous QHS  . multivitamin with minerals  1 tablet Oral Daily  . Ensure Max Protein  11 oz Oral Daily  . senna  1 tablet Oral BID  . umeclidinium bromide  1 puff Inhalation Daily    Consultants:  Orthopedics  Procedures:  ORIF left periprosthetic hip fracture  Antibiotics:   Anti-infectives (From admission, onward)   Start     Dose/Rate Route Frequency Ordered Stop   04/19/20 2200  ceFAZolin (ANCEF) IVPB 2g/100 mL premix        2 g 200 mL/hr over 30 Minutes Intravenous Every 8 hours 04/19/20 2026 04/20/20 1408   04/19/20 1638  vancomycin (VANCOCIN) powder  Status:  Discontinued          As needed 04/19/20 1638 04/19/20 1747   04/19/20 1500  ceFAZolin (ANCEF) IVPB 2g/100 mL premix        2 g 200 mL/hr over 30 Minutes Intravenous  Once 04/19/20 1446 04/19/20 1551   04/19/20 1458  ceFAZolin (ANCEF) 2-4 GM/100ML-% IVPB       Note to Pharmacy:  Gleason, Ginger   : cabinet override      04/19/20 1458 04/19/20 1530       Objective   Vitals:   04/24/20 0324 04/24/20 0800 04/24/20 0831 04/24/20 1412  BP: (!) 127/48 (!) 112/49  (!) 142/62  Pulse: 71 68  67  Resp: 17 17  17   Temp: 98.8 F (37.1 C) 98.2 F (36.8 C)  98.1 F (36.7 C)  TempSrc: Oral Oral  Oral  SpO2: 99% 92% 92% 100%  Weight:      Height:        Intake/Output Summary (Last 24 hours) at 04/24/2020 1547 Last data filed at 04/24/2020 8921 Gross per 24 hour  Intake --  Output 2800 ml  Net -2800 ml    06/12 1901 - 06/14 0700 In: 555 [P.O.:240] Out: 6450 [Urine:6450]  Filed Weights   04/19/20 0816  Weight: 94.9 kg    Physical Examination:    General-appears in no acute distress Heart-S1-S2, regular, no murmur auscultated Lungs-clear to auscultation bilaterally, no wheezing or crackles auscultated Abdomen-soft, nontender, no organomegaly Extremities-no edema in the lower extremities Neuro-alert, oriented x3, no focal deficit noted   Data Reviewed:   Recent Results (from the past 240 hour(s))  Resp Panel by RT PCR (RSV, Flu A&B, Covid) - Nasopharyngeal Swab     Status: None   Collection Time: 04/18/20 11:36 AM   Specimen: Nasopharyngeal Swab  Result Value Ref Range Status   SARS Coronavirus 2 by RT PCR NEGATIVE NEGATIVE Final    Comment: (NOTE) SARS-CoV-2 target nucleic acids are NOT DETECTED. The SARS-CoV-2 RNA is generally detectable in upper respiratoy specimens during the acute phase of infection. The lowest concentration of SARS-CoV-2 viral copies this assay can detect is 131 copies/mL. A negative result does not preclude SARS-Cov-2 infection and should not be used as the sole basis for treatment or other patient management decisions. A negative result may occur with  improper specimen collection/handling, submission of specimen other than nasopharyngeal swab, presence of viral mutation(s) within the areas targeted by this assay, and  inadequate number of viral copies (<131 copies/mL). A negative result must be combined with clinical observations, patient history, and epidemiological information. The expected result is Negative. Fact Sheet for Patients:  PinkCheek.be Fact Sheet for Healthcare Providers:  GravelBags.it This test is not yet ap proved or cleared by the [place] FDA and  has been authorized for detection and/or diagnosis of SARS-CoV-2 by FDA under an Emergency Use Authorization (EUA). This EUA will remain  in effect (meaning this test can be used) for the duration of the COVID-19 declaration under Section 564(b)(1) of the Act, 21 U.S.C. section 360bbb-3(b)(1), unless the authorization is terminated or revoked sooner.    Influenza A by PCR NEGATIVE NEGATIVE Final   Influenza B by PCR NEGATIVE NEGATIVE Final    Comment: (NOTE) The Xpert Xpress SARS-CoV-2/FLU/RSV assay is intended as an aid in  the diagnosis of influenza from Nasopharyngeal swab specimens and  should not be used as a sole basis for treatment. Nasal washings and  aspirates are unacceptable for Xpert Xpress SARS-CoV-2/FLU/RSV  testing. Fact Sheet for Patients: PinkCheek.be Fact Sheet for Healthcare Providers: GravelBags.it This test is not yet approved or cleared by the [place] FDA and  has been authorized for detection and/or diagnosis of SARS-CoV-2 by  FDA under an Emergency Use Authorization (EUA). This EUA will remain  in effect (meaning this test can be used) for the duration of the  Covid-19 declaration under Section 564(b)(1) of the Act, 21  U.S.C. section 360bbb-3(b)(1), unless the authorization is  terminated or revoked.    Respiratory Syncytial Virus by PCR NEGATIVE NEGATIVE Final    Comment: (NOTE) Fact Sheet for Patients: PinkCheek.be Fact Sheet for Healthcare  Providers: GravelBags.it This test is not yet approved or cleared by the [place] FDA and  has been authorized for detection and/or diagnosis of SARS-CoV-2 by  FDA under an Emergency Use Authorization (EUA). This  EUA will remain  in effect (meaning this test can be used) for the duration of the  COVID-19 declaration under Section 564(b)(1) of the Act, 21 U.S.C.  section 360bbb-3(b)(1), unless the authorization is terminated or  revoked. Performed at [facility], [place] [address], [place], [place] [address]   MRSA PCR Screening     Status: None   Collection Time: 04/19/20  5:26 AM   Specimen: Nasal Mucosa; Nasopharyngeal  Result Value Ref Range Status   MRSA by PCR NEGATIVE NEGATIVE Final    Comment:        The GeneXpert MRSA Assay (FDA approved for NASAL specimens only), is one component of a comprehensive MRSA colonization surveillance program. It is not intended to diagnose MRSA infection nor to guide or monitor treatment for MRSA infections. Performed at [facility], [place] [address], [place], [place] [address]       [facility] If 7PM-7AM, please contact night-coverage at [email or web address], Office  [phone]   04/24/2020, 3:47 PM  LOS: 6 days

## 2020-04-24 NOTE — PMR Pre-admission (Signed)
PMR Admission Coordinator Pre-Admission Assessment  Patient: [name] Small is an 86 y.o., male MRN: [id number] DOB: 02-11-1928 Height: 6' 2"  (188 cm) Weight: 94.9 kg  Insurance Information HMO: yes    PPO:      PCP:      IPA:      80/20:      OTHER:  PRIMARY: UHC Medicare      Policy#: [id number]      Subscriber: patient CM Name: Wilburn Cornelia      Phone#: [phone]     Fax#: [phone] Pre-Cert#: [id number]      Employer:  Josem Kaufmann provided by Wilburn Cornelia for admit to CIR. Pt is approved for 7 days with start date 6/16-6/22. Clinical updates due to (f): [phone] and (p): [phone] ([facility] health #: [phone]) Benefits:  Phone #: online     Name: [email or web address] Eff. Date: 11/12/19- present     Deduct: $0 (does not have deductible)      Out of Pocket Max: $3,600 ($222.50 met)  Life Max: NA CIR: $295/day co-pay for days 1-5, $0/day co-pay for days 6+      SNF: $0/day co-pay for days 1-20, $184/day co-pay for days 21-40, $0/day co-pay for days 41-100; limited to 100 days/cal yr Outpatient: $30/visit co-pay; limited by medical necessity    Home Health: 100% coverage, 0% co-insurance; limited by medical necessity      DME: 80% coverage, 20% co-insurance     Providers:  SECONDARY: None      Policy#:      Phone#:   Development worker, community:       Phone#:   The Engineer, petroleum" for patients in Inpatient Rehabilitation Facilities with attached "Privacy Act [place] Records" was provided and verbally reviewed with: Patient and Family  Emergency Contact Information Contact Information    Name Relation Home Work Mobile   Antu,[name] Spouse [phone]  [phone]   Dale [place] Daughter [phone]        Current Medical History  Patient Admitting Diagnosis: Left proximal femur fracture s/p ORIF   History of Present Illness: Pt is an 86 yo male with history of nonobstructive CAD, HTN, hyperlipidemia, diverticulosis, non-insulin dependent DM2, COPD on room  air, CKD stage IIIb, HF with preserved EF, arthritis status post left femur fracture and ORIF in 2018. Pt was admitted to [facility] on 04/18/20 after a mechanical fall at home; he presented with left hip and rib pain. Imaging showed a left promixal femur fracture and pt underwent ORIF by Dr. [name] on 04/19/20. Pt had developed hematuria which was most likley traumatic from foley catheter insertion. Pt does have a history of bladder cancer and is followed by urology as an outpatient. Urology was consulted on 6/11 and recommended catheter irrigation to liberate the clots and holding Lovenox. Hematuria now resolved and foley catheter removed. Pt did have acute blood loss anemia as his hgb dropped from 13/9 to 7.5 in 4 days; pt received 1 unit PRBC with hgb stable at 8.2 after transfusion. Pt has been evaluated by therapies with recommendation for CIR. Pt is to be admitted to CIR on 04/26/20.     Patient's medical record from [facility] has been reviewed by the rehabilitation admission coordinator and physician.  Past Medical History  Past Medical History:  Diagnosis Date  . (HFpEF) heart failure with preserved ejection fraction ([place])   . Arthritis   . Back pain    s/p lumbar injection 2014  . BENIGN PROSTATIC HYPERTROPHY 05/21/2007  .  Bladder cancer ([place]) 10/2011  . CAD (coronary artery disease)    nonobstructive by cath 6/12:  mid LAD 30%, proximal obtuse marginal-2 30%, proximal RCA 20%, mid RCA 30-40%.  He had normal cardiac output and mildly elevated filling pressures but no significant pulmonary hypertension;   Echocardiogram in May 2012 demonstrated EF 50-55% and left atrial enlargement   . Cellulitis of left foot    Hospitalized in 2006  . Chronic kidney disease (CKD), stage III (moderate) 12/04/2007   FOLLOWED BY DR Delorice Bannister  . Chronic pain of lower extremity   . Complication of anesthesia    1 time bladder cancer surgery- 2012 , oxygen satruratioon dropped had to stay overnight, no  problems since then.  Marland Kitchen COPD (chronic obstructive pulmonary disease) ([place])   . DIABETES MELLITUS, TYPE II 05/21/2007  . DIVERTICULOSIS, COLON 05/21/2007   pt denies  . Dyspnea    with exertion, patient mointors saturation  . Fatigue AGE-RELATED  . Gross hematuria    pt denies  . HYPERTENSION 05/21/2007  . Impaired hearing BILATERAL HEARING AIDS  . On home oxygen therapy    "2L at nighttime" (02/28/2016)  . PAD (peripheral artery disease) ([place])    a. 02/2016 L foot nonhealing ulcer-->Periph angio: L pop 100 w/ reconstitution via extensive vollats to the prox-mid peroneal (only patent vessel BK)-->PTA of L Pop and peroneal; b. Ongoing LE ischemia 5 & 06/2016 req amputation of toes on L foot.  . Pneumonia    Hx  of   . Psoriasis ELBOWS  . Psoriasis   . PSORIASIS, SCALP 10/25/2008    Family History   family history includes Diabetes in his mother; Heart disease in his brother, brother, brother, brother, brother, and brother; Lung cancer in his brother.  Prior Rehab/Hospitalizations Has the patient had prior rehab or hospitalizations prior to admission? No  Has the patient had major surgery during 100 days prior to admission? Yes   Current Medications  Current Facility-Administered Medications:  .  0.9 %  sodium chloride infusion, , Intravenous, Continuous, Charlynne Cousins, MD, Stopped at 04/24/20 1056 .  acetaminophen (TYLENOL) tablet 325-650 mg, 325-650 mg, Oral, Q6H PRN, Delray Alt, PA-C, 650 mg at 04/25/20 1828 .  amLODipine (NORVASC) tablet 2.5 mg, 2.5 mg, Oral, Daily, Patrecia Pace A, PA-C, 2.5 mg at 04/21/20 0816 .  atorvastatin (LIPITOR) tablet 10 mg, 10 mg, Oral, Daily, Delray Alt, PA-C, 10 mg at 04/25/20 7209 .  Chlorhexidine Gluconate Cloth 2 % PADS 6 each, 6 each, Topical, Daily, Delray Alt, PA-C, 6 each at 04/23/20 0816 .  escitalopram (LEXAPRO) tablet 5 mg, 5 mg, Oral, Daily, Delray Alt, PA-C, 5 mg at 04/25/20 4709 .  feeding supplement (GLUCERNA  SHAKE) (GLUCERNA SHAKE) liquid 237 mL, 237 mL, Oral, BID BM, Darrick Meigs, Marge Duncans, MD, 237 mL at 04/25/20 1234 .  fluticasone furoate-vilanterol (BREO ELLIPTA) 200-25 MCG/INH 1 puff, 1 puff, Inhalation, Daily, Delray Alt, PA-C, 1 puff at 04/26/20 0845 .  HYDROcodone-acetaminophen (NORCO/VICODIN) 5-325 MG per tablet 1 tablet, 1 tablet, Oral, Q8H, Darrick Meigs, Marge Duncans, MD, 1 tablet at 04/26/20 0533 .  HYDROmorphone (DILAUDID) injection 0.5 mg, 0.5 mg, Intravenous, Q3H PRN, Charlynne Cousins, MD .  insulin aspart (novoLOG) injection 0-5 Units, 0-5 Units, Subcutaneous, QHS, Delray Alt, PA-C, 2 Units at 04/25/20 2102 .  insulin aspart (novoLOG) injection 0-9 Units, 0-9 Units, Subcutaneous, TID WC, Delray Alt, PA-C, 2 Units at 04/25/20 1655 .  insulin glargine (LANTUS) injection  15 Units, 15 Units, Subcutaneous, BID, Charlynne Cousins, MD .  melatonin tablet 5 mg, 5 mg, Oral, QHS PRN, Delray Alt, PA-C, 5 mg at 04/25/20 2202 .  methocarbamol (ROBAXIN) tablet 500 mg, 500 mg, Oral, Q6H PRN, Delray Alt, PA-C, 500 mg at 04/25/20 1828 .  multivitamin with minerals tablet 1 tablet, 1 tablet, Oral, Daily, Oswald Hillock, MD, 1 tablet at 04/25/20 0626 .  ondansetron (ZOFRAN) injection 4 mg, 4 mg, Intravenous, Q6H PRN, Blount, Xenia T, NP, 4 mg at 04/22/20 2013 .  polyethylene glycol (MIRALAX / GLYCOLAX) packet 17 g, 17 g, Oral, Daily PRN, Delray Alt, PA-C, 17 g at 04/20/20 2227 .  protein supplement (ENSURE MAX) liquid, 11 oz, Oral, Daily, Oswald Hillock, MD, 11 oz at 04/25/20 0823 .  senna (SENOKOT) tablet 8.6 mg, 1 tablet, Oral, BID, Delray Alt, PA-C, 8.6 mg at 04/25/20 2101 .  Continuous Bladder Irrigation, , , Until Discontinued **AND** sodium chloride irrigation 0.9 % 3,000 mL, 3,000 mL, Irrigation, Continuous, Dahlstedt, Stephen, MD, 3,000 mL at 04/22/20 1700 .  umeclidinium bromide (INCRUSE ELLIPTA) 62.5 MCG/INH 1 puff, 1 puff, Inhalation, Daily, Delray Alt, PA-C, 1 puff at  04/26/20 0845  Patients Current Diet:  Diet Order            Diet Carb Modified Fluid consistency: Thin; Room service appropriate? Yes  Diet effective now                 Precautions / Restrictions Precautions Precautions: Fall Restrictions Weight Bearing Restrictions: Yes LLE Weight Bearing: Touchdown weight bearing Other Position/Activity Restrictions: Pt has difficulty maintaining weight bearing.   Has the patient had 2 or more falls or a fall with injury in the past year? Yes  Prior Activity Level Community (5-7x/wk): retired Freight forwarder; does not drive but does get out of the house 2-3x/week; does need some assist for bathing at times for safety (min A)  Prior Functional Level Self Care: Did the patient need help bathing, dressing, using the toilet or eating? Independent with some assist for bathing (min A).  Indoor Mobility: Did the patient need assistance with walking from room to room (with or without device)? Independent  Stairs: Did the patient need assistance with internal or external stairs (with or without device)? Independent  Functional Cognition: Did the patient need help planning regular tasks such as shopping or remembering to take medications? Independent  Home Assistive Devices / Equipment Home Assistive Devices/Equipment: CBG Meter, Hearing aid, Environmental consultant (specify type) (bilateral hearing aids, tripod walker) Home Equipment: Shower seat, Hand held shower head, Grab bars - tub/shower, Walker - 4 wheels, Cane - single point  Prior Device Use: Indicate devices/aids used by the patient prior to current illness, exacerbation or injury? Cane and RW  Current Functional Level Cognition  Overall Cognitive Status: History of cognitive impairments - at baseline Difficult to assess due to: Hard of hearing/deaf Orientation Level: Oriented X4 General Comments: Pt following most commands; hearing aid in L ear    Extremity Assessment (includes Sensation/Coordination)   Upper Extremity Assessment: Generalized weakness  Lower Extremity Assessment: Generalized weakness LLE Deficits / Details: s/p hip sx    ADLs  Overall ADL's : Needs assistance/impaired Eating/Feeding: Set up, Bed level Grooming: Set up, Wash/dry hands, Wash/dry face, Bed level Upper Body Bathing: Moderate assistance, Sitting Lower Body Bathing: Total assistance, Bed level Lower Body Bathing Details (indicate cue type and reason): Pt was unaware of BM in bed. Pt unable to  assist other than rolling side to side Upper Body Dressing : Moderate assistance, Sitting Lower Body Dressing: Total assistance, Bed level Toileting- Clothing Manipulation and Hygiene: Total assistance, Bed level Toileting - Clothing Manipulation Details (indicate cue type and reason): pt with bowel incontinence Functional mobility during ADLs: Moderate assistance, +2 for physical assistance, +2 for safety/equipment General ADL Comments: Pt limited by [facility] nature, decreased strength, decreased activity tolerance and decreased ability to care for self. Pt's session focused on pericare and transferring to recliner.    Mobility  Overal bed mobility: Needs Assistance Bed Mobility: Supine to Sit, Rolling Rolling: Min assist Supine to sit: Min assist Sit to supine: +2 for physical assistance, Mod assist General bed mobility comments: ModA for rolling side to side and assist with LLE.    Transfers  Overall transfer level: Needs assistance Equipment used: Ambulation equipment used Transfer via Lift Equipment: Stedy Transfers: Sit to/from Stand Sit to Stand: Mod assist, +2 safety/equipment, From elevated surface General transfer comment: assist for power up    Ambulation / Gait / Stairs / Wheelchair Mobility  Ambulation/Gait Ambulation/Gait assistance:  (NT) General Gait Details: Deferred    Posture / Balance Balance Overall balance assessment: Needs assistance Sitting balance-Leahy Scale: Poor Postural control:  Posterior lean Standing balance-Leahy Scale: Zero    Special needs/care consideration Oxygen: 2L  Skin: abrasion to right, left arm, amputation to left toe, ecchymosis to right and left arm, skin tear to left arm; surgical incision to left leg   Diabetic management: yes   Designated visitor: wife [name]  Hard of hearing   Previous Home Environment (from acute therapy documentation) Living Arrangements: Spouse/significant other Available Help at Discharge: Family, Available 24 hours/day Type of Home: House Home Layout: One level Home Access: Stairs to enter Entrance Stairs-Rails: Left Entrance Stairs-Number of Steps: 3 Bathroom Shower/Tub: Multimedia programmer: Handicapped height Home Care Services: No  Discharge Living Setting Plans for Discharge Living Setting: Patient's home, Lives with (comment) (wife) Type of Home at Discharge: House Discharge Home Layout: One level Discharge Home Access: Stairs to enter Entrance Stairs-Rails: Left Entrance Stairs-Number of Steps: 3 Discharge Bathroom Shower/Tub: Walk-in shower Discharge Bathroom Toilet: Handicapped height Discharge Bathroom Accessibility: Yes How Accessible: Accessible via walker Does the patient have any problems obtaining your medications?: No  Social/Family/Support Systems Patient Roles: Spouse Contact Information: wife: [name]: [phone] Anticipated Caregiver: wife Anticipated Caregiver's Contact Information: wife see above Ability/Limitations of Caregiver: Min A Caregiver Availability: 24/7 Discharge Plan Discussed with Primary Caregiver: Yes Is Caregiver In Agreement with Plan?: Yes Does Caregiver/Family have Issues with Lodging/Transportation while Pt is in Rehab?: No  Goals Patient/Family Goal for Rehab: PT/OT: Supervision/Min A; SLP: NA Expected length of stay: 10-14 days Cultural Considerations: NA Pt/Family Agrees to Admission and willing to participate: Yes Program Orientation Provided  & Reviewed with Pt/Caregiver Including Roles  & Responsibilities: Yes (pt's wife)  Barriers to Discharge: Home environment access/layout  Barriers to Discharge Comments: steps to enter home.   Decrease burden of Care through IP rehab admission: NA  Possible need for SNF placement upon discharge: Pt has good support at DC from his wife; anticipate he can reach a Min A level to return home with his wife.   Patient Condition: I have reviewed medical records from [facility], spoken with MD, RN, and patient and spouse. I met with patient at the bedside for inpatient rehabilitation assessment.  Patient will benefit from ongoing PT and OT, can actively participate in 3 hours of  therapy a day 5 days of the week, and can make measurable gains during the admission.  Patient will also benefit from the coordinated team approach during an Inpatient Acute Rehabilitation admission.  The patient will receive intensive therapy as well as Rehabilitation physician, nursing, social worker, and care management interventions.  Due to safety, skin/wound care, disease management, medication administration, pain management and patient education the patient requires 24 hour a day rehabilitation nursing.  The patient is currently Mod A for transfers and up to total A for basic ADLs.  Discharge setting and therapy post discharge at home with home health is anticipated.  Patient has agreed to participate in the Acute Inpatient Rehabilitation Program and will admit 04/26/20.  Preadmission Screen Completed By:  Raechel Ache, 04/26/2020 10:33 AM ______________________________________________________________________   Discussed status with Dr. Posey Pronto  on 04/26/20 at 10:33AM and received approval for admission today.  Admission Coordinator:  Raechel Ache, OT, time 10:33AM/Date 04/26/20   Assessment/Plan: Diagnosis: Left proximal femur fracture s/p ORIF  1. Does the need for close, 24 hr/day Medical supervision in  concert with the patient's rehab needs make it unreasonable for this patient to be served in a less intensive setting? Yes 2. Co-Morbidities requiring supervision/potential complications: nonobstructive CAD, HTN, hyperlipidemia, diverticulosis, non-insulin dependent DM2, COPD on room air, CKD stage IIIb, HF with preserved EF, arthritis  3. Due to bowel management, safety, skin/wound care, disease management, pain management and patient education, does the patient require 24 hr/day rehab nursing? Yes 4. Does the patient require coordinated care of a physician, rehab nurse, PT, OT to address physical and functional deficits in the context of the above medical diagnosis(es)? Yes Addressing deficits in the following areas: balance, endurance, locomotion, strength, transferring, bathing, dressing, toileting and psychosocial support 5. Can the patient actively participate in an intensive therapy program of at least 3 hrs of therapy 5 days a week? Yes 6. The potential for patient to make measurable gains while on inpatient rehab is excellent 7. Anticipated functional outcomes upon discharge from inpatient rehab: supervision and min assist PT, supervision and min assist OT, n/a SLP 8. Estimated rehab length of stay to reach the above functional goals is: 10-14 days. 9. Anticipated discharge destination: Home 10. Overall Rehab/Functional Prognosis: good   MD Signature: Delice Lesch, MD, ABPMR

## 2020-04-24 NOTE — Plan of Care (Signed)
  Problem: Education: Goal: Verbalization of understanding the information provided (i.e., activity precautions, restrictions, etc) will improve Outcome: Progressing Goal: Individualized Educational Video(s) Outcome: Progressing   Problem: Activity: Goal: Ability to ambulate and perform ADLs will improve Outcome: Progressing   Problem: Self-Concept: Goal: Ability to maintain and perform role responsibilities to the fullest extent possible will improve Outcome: Progressing   Problem: Pain Management: Goal: Pain level will decrease Outcome: Progressing   Problem: Education: Goal: Knowledge of General Education information will improve Description: Including pain rating scale, medication(s)/side effects and non-pharmacologic comfort measures Outcome: Progressing

## 2020-04-24 NOTE — Progress Notes (Signed)
Ortho Trauma Note  No pain in hip currently. A lot of pain from catheter. Glad it was removed this AM. No other complaints  O: resting comfortably Dressing clean, dry and intact Neuro intact distally  Hgb 8.0 this AM  A/P 86 yo male s/p ORIF periprosthetic femur fracture  TDWB LLE Dressing to remain in place, change PRN ABLA- stable after PRBC transfusion PT/OT CIR decision pending Okay to D/C from ortho perspective Follow up 2 weeks for suture removal and x-rays  Shona Needles, MD [facility] [phone] (office) [email or web address]

## 2020-04-24 NOTE — Progress Notes (Addendum)
Pt complains of new L. Handed numbness and has new bilateral hand tremors at rest. Pt VSS, good grip strength bilaterally, PERRLA, face is symmetrical, pt answers all questions appropriately. Attending paged, waiting return call, will continue to monitor.   New orders placed, will continue to monitor.

## 2020-04-24 NOTE — Progress Notes (Signed)
RT note-Attempted to given inhalers, patient very sleepy, wife at bedside, spoke with RN to give them later.

## 2020-04-24 NOTE — Progress Notes (Addendum)
Initial Nutrition Assessment  RD working remotely.  DOCUMENTATION CODES:   Not applicable  INTERVENTION:   -MVI with minerals daily -Glucerna Shake po BID, each supplement provides 220 kcal and 10 grams of protein -Ensure Max po daily, each supplement provides 150 kcal and 30 grams of protein -Feeding assistance with meals  NUTRITION DIAGNOSIS:   Increased nutrient needs related to post-op healing as evidenced by estimated needs.  GOAL:   Patient will meet greater than or equal to 90% of their needs  MONITOR:   PO intake, Supplement acceptance, Labs, Weight trends, Skin, I & O's  REASON FOR ASSESSMENT:   Consult Assessment of nutrition requirement/status  ASSESSMENT:   [name] Small is a 86 y.o. male with medical history significant for nonobstructive CAD, hypertension, hyperlipidemia, diverticulosis, non-insulin-dependent diabetes type 2, COPD on room air, CKD 3B, heart failure with preserved ejection fraction(no recent echo on file) arthritis status post left femur fracture and ORIF 2018.  Patient presents from home with wife after mechanical fall stepping off a curb and missing his step he fell onto his left side with left hip and rib pain.  Pt admitted with lt hip fracture s/p mechanical fall.   6/9- s/p Procedures: CPT 27244-Open reduction internal fixation of left proximal femur fracture  Reviewed I/O's: -2.2 L x 24 hours and -10 L x 24 hours  UOP: 2.8 L x 24 hours  Attempted to speak with pt via phone, however, no answer.   Per DM coordinator notes, pt wife reports concern of decreased oral intake and is interested in oral nutrition supplements. Pt occasionally snacks and consumes estra fruit; consumes a liberalized diet secondary to his age, which is appropriate. Noted meal completion 50-100%. Pt reported to be very sleepy today. Per [employer] meal ordering system records, pt usually only consuming 2 meals per day.   Reviewed wt hx; wt has been stable over  the past year.   Per RN notes, pt with hand tremors; may benefit from feeding assistance.  Per therapy notes, plan to d/c to CIR vs SNF once medically stable for discharge.   Medications reviewed and include 0.9% sodium chloride infusion @ 75 ml/hr.   Lab Results  Component Value Date   HGBA1C 9.3 (H) 04/19/2020   PTA DM medications are 15 units insulin glargine daily at bedtime and 5 mg glipizide daily. Per ADA's Standards of Medical Care of Diabetes, glycemic targets for older adults who have multiple co-morbidities, cognitive impairments, and functional dependence should be less stringent (Hgb A1c <8.0-8.5).    Labs reviewed: K: 5.8, CBGS: 240-303 (inpatient orders for glycemic control are 0-5 units inuslin aspart daily at bedtime, 0-9 units inuslin aspart TID with meals, and 15 units insulin glargine daily at bedtime).   Diet Order:   Diet Order            Diet Carb Modified Fluid consistency: Thin; Room service appropriate? Yes  Diet effective now                 EDUCATION NEEDS:   No education needs have been identified at this time  Skin:  Skin Assessment: Skin Integrity Issues: Skin Integrity Issues:: Incisions Incisions: closed lt leg  Last BM:  04/21/20  Height:   Ht Readings from Last 1 Encounters:  04/19/20 6\' 2"  (1.88 m)    Weight:   Wt Readings from Last 1 Encounters:  04/19/20 94.9 kg    Ideal Body Weight:  86.4 kg  BMI:  Body mass index  is 26.86 kg/m.  Estimated Nutritional Needs:   Kcal:  2000-2200  Protein:  105-120 grams  Fluid:  > 2 L    Loistine Chance, RD, LDN, [place] Registered Dietitian II Certified Diabetes Care and Education Specialist Please refer to [facility] for RD and/or RD on-call/weekend/after hours pager

## 2020-04-24 NOTE — Plan of Care (Signed)
  Problem: Pain Managment: Goal: General experience of comfort will improve Outcome: Progressing   Problem: Safety: Goal: Ability to remain free from injury will improve Outcome: Progressing   Problem: Skin Integrity: Goal: Risk for impaired skin integrity will decrease Outcome: Progressing   

## 2020-04-25 LAB — BASIC METABOLIC PANEL
Anion gap: 8 (ref 5–15)
BUN: 44 mg/dL — ABNORMAL HIGH (ref 8–23)
CO2: 27 mmol/L (ref 22–32)
Calcium: 7.9 mg/dL — ABNORMAL LOW (ref 8.9–10.3)
Chloride: 99 mmol/L (ref 98–111)
Creatinine, Ser: 1.99 mg/dL — ABNORMAL HIGH (ref 0.61–1.24)
GFR calc Af Amer: 34 mL/min — ABNORMAL LOW (ref >60)
GFR calc non Af Amer: 29 mL/min — ABNORMAL LOW (ref >60)
Glucose, Bld: 203 mg/dL — ABNORMAL HIGH (ref 70–99)
Potassium: 5.1 mmol/L (ref 3.5–5.1)
Sodium: 134 mmol/L — ABNORMAL LOW (ref 135–145)

## 2020-04-25 LAB — CBC
HCT: 25.7 % — ABNORMAL LOW (ref 39.0–52.0)
Hemoglobin: 8.2 g/dL — ABNORMAL LOW (ref 13.0–17.0)
MCH: 33.2 pg (ref 26.0–34.0)
MCHC: 31.9 g/dL (ref 30.0–36.0)
MCV: 104 fL — ABNORMAL HIGH (ref 80.0–100.0)
Platelets: 268 10*3/uL (ref 150–400)
RBC: 2.47 MIL/uL — ABNORMAL LOW (ref 4.22–5.81)
RDW: 12.9 % (ref 11.5–15.5)
WBC: 10 10*3/uL (ref 4.0–10.5)
nRBC: 0 % (ref 0.0–0.2)

## 2020-04-25 LAB — GLUCOSE, CAPILLARY
Glucose-Capillary: 174 mg/dL — ABNORMAL HIGH (ref 70–99)
Glucose-Capillary: 207 mg/dL — ABNORMAL HIGH (ref 70–99)
Glucose-Capillary: 168 mg/dL — ABNORMAL HIGH (ref 70–99)
Glucose-Capillary: 206 mg/dL — ABNORMAL HIGH (ref 70–99)

## 2020-04-25 MED ORDER — HYDROCODONE-ACETAMINOPHEN 5-325 MG PO TABS: 1.0000 | ORAL_TABLET | Freq: Four times a day (QID) | ORAL | Status: DC | PRN

## 2020-04-25 MED ORDER — HYDROCODONE-ACETAMINOPHEN 5-325 MG PO TABS
1.0000 | ORAL_TABLET | Freq: Three times a day (TID) | ORAL | Status: DC
  Administered 2020-04-25 – 2020-04-26 (×4): 1 via ORAL
  Filled 2020-04-25 (×4): qty 1

## 2020-04-25 NOTE — Progress Notes (Signed)
Inpatient Diabetes Program Recommendations  AACE/ADA: New Consensus Statement on Inpatient Glycemic Control (2015)  Target Ranges:  Prepandial:   less than 140 mg/dL      Peak postprandial:   less than 180 mg/dL (1-2 hours)      Critically ill patients:  140 - 180 mg/dL   Lab Results  Component Value Date   GLUCAP 207 (H) 04/25/2020   HGBA1C 9.3 (H) 04/19/2020    Review of Glycemic Control Results for [name] Small, [name] Small (MRN [id number]) as of 04/25/2020 13:08  Ref. Range 04/24/2020 08:04 04/24/2020 11:18 04/24/2020 16:07 04/24/2020 20:45 04/25/2020 07:44 04/25/2020 11:36  Glucose-Capillary Latest Ref Range: 70 - 99 mg/dL 240 (H) 232 (H) 210 (H) 257 (H) 174 (H) 207 (H)    Diabetes history: DM 2 Outpatient Diabetes medications: Glipizide 5 mg QAM, Lantus 15 units QHS Current orders for Inpatient glycemic control:  Lantus 15 units qhs Novolog 0-9 units tid + hs  A1c 9.3% on 6/9  Inpatient Diabetes Program Recommendations:    Fasting glucose 174 this am. Trends increase after meal intake  -  Increase Lantus to 22 units. -  Consider Novolog 2 units tid meal coverage if eating >50% of meals.  Thanks,  Tama Headings RN, MSN, BC-ADM Inpatient Diabetes Coordinator Team Pager [phone] (8a-5p)

## 2020-04-25 NOTE — Progress Notes (Signed)
Occupational Therapy Treatment Patient Details Name: [name] Small MRN: [id number] DOB: 08/09/1928 Today's Date: 04/25/2020    History of present illness Pt is an 86 year old man admitted with L hip fx on 04/18/20 after falling. Underwent ORIF on 04/19/20. PMH: COPD (per wife not 02 dependent), HTN, CAD, PVD, L transmet amputation, DM, afib, CHF, CKD, renal disease and anxiety.   OT comments  Pt progressing well. Pt limited by [facility] nature, decreased strength, decreased activity tolerance and decreased ability to care for self. Pt's session focused on pericare and transferring to recliner. Pt minA for rolling and modA+2 for sit to stands. Pt would benefit from continued OT skilled services. OT following acutely.    Follow Up Recommendations  CIR    Equipment Recommendations  3 in 1 bedside commode;Wheelchair (measurements OT);Wheelchair cushion (measurements OT)    Recommendations for Other Services      Precautions / Restrictions Precautions Precautions: Fall Restrictions Weight Bearing Restrictions: Yes LLE Weight Bearing: Touchdown weight bearing       Mobility Bed Mobility Overal bed mobility: Needs Assistance Bed Mobility: Supine to Sit;Rolling Rolling: Min assist   Supine to sit: Min assist     General bed mobility comments: ModA for rolling side to side and assist with LLE.  Transfers Overall transfer level: Needs assistance Equipment used: Ambulation equipment used Transfers: Sit to/from Stand Sit to Stand: Mod assist;+2 safety/equipment;From elevated surface         General transfer comment: assist for power up    Balance Overall balance assessment: Needs assistance   Sitting balance-Leahy Scale: Poor   Postural control: Posterior lean   Standing balance-Leahy Scale: Zero                             ADL either performed or assessed with clinical judgement   ADL Overall ADL's : Needs assistance/impaired             Lower Body  Bathing: Total assistance;Bed level Lower Body Bathing Details (indicate cue type and reason): Pt was unaware of BM in bed. Pt unable to assist other than rolling side to side                     Functional mobility during ADLs: Moderate assistance;+2 for physical assistance;+2 for safety/equipment General ADL Comments: Pt limited by [facility] nature, decreased strength, decreased activity tolerance and decreased ability to care for self. Pt's session focused on pericare and transferring to recliner.     Vision   Vision Assessment?: No apparent visual deficits   Perception     Praxis      Cognition Arousal/Alertness: Awake/alert Behavior During Therapy: WFL for tasks assessed/performed Overall Cognitive Status: History of cognitive impairments - at baseline                                 General Comments: Pt following most commands; hearing aid in L ear        Exercises     Shoulder Instructions       General Comments pt's spouse in room; NT assisting with BM.    Pertinent Vitals/ Pain       Pain Assessment: Faces Faces Pain Scale: Hurts little more Pain Location: L hip Pain Descriptors / Indicators: Grimacing;Guarding Pain Intervention(s): Monitored during session  Home Living  Prior Functioning/Environment              Frequency  Min 2X/week        Progress Toward Goals  OT Goals(current goals can now be found in the care plan section)  Progress towards OT goals: Progressing toward goals  Acute Rehab OT Goals Patient Stated Goal: to get stronger and return home OT Goal Formulation: With patient/family Time For Goal Achievement: 05/04/20 Potential to Achieve Goals: Good ADL Goals Pt Will Perform Grooming: with supervision;sitting Pt Will Perform Upper Body Bathing: with min assist;sitting Pt Will Perform Upper Body Dressing: with supervision;sitting Pt Will Transfer to  Toilet: with mod assist;stand pivot transfer Additional ADL Goal #1: Pt will demonstrate ability to sit EOB with supervision x 10 minutes while engaged in ADL. Additional ADL Goal #2: Pt will perform bed mobility with min assist in preparation for ADL.  Plan Discharge plan remains appropriate    Co-evaluation                 AM-PAC OT "6 Clicks" Daily Activity     Outcome Measure   Help from another person eating meals?: None Help from another person taking care of personal grooming?: A Little Help from another person toileting, which includes using toliet, bedpan, or urinal?: Total Help from another person bathing (including washing, rinsing, drying)?: A Lot Help from another person to put on and taking off regular upper body clothing?: A Lot Help from another person to put on and taking off regular lower body clothing?: Total 6 Click Score: 13    End of Session Equipment Utilized During Treatment: Gait belt  OT Visit Diagnosis: Pain;Muscle weakness (generalized) (M62.81) Pain - Right/Left: Left Pain - part of body: Hip   Activity Tolerance Patient tolerated treatment well   Patient Left in chair;with call bell/phone within reach;with chair alarm set;with family/visitor present   Nurse Communication Mobility status;Need for lift equipment        Time: [phone] OT Time Calculation (min): 32 min  Charges: OT General Charges $OT Visit: 1 Visit OT Treatments $Self Care/Home Management : 8-22 mins $Therapeutic Activity: 8-22 mins  Jefferey Pica, OTR/L Acute Rehabilitation Services Pager: [phone] Office: [phone]    [name] Small C 04/25/2020, 4:40 PM

## 2020-04-25 NOTE — Progress Notes (Addendum)
[facility]  PROGRESS NOTE  [name] Small OHY:[id number] DOB: 08-09-1928 DOA: 04/18/2020 PCP: Tonia Ghent, MD   Brief HPI:   86 year old male with medical history of nonobstructive CAD, hypertension, hyperlipidemia, diverticulosis, non-insulin-dependent diabetes type 2, COPD on room air, CKD stage IIIb, heart failure with preserved ejection fraction, arthritis status post left femur fracture and ORIF 2018 presented from home after mechanical fall stepping off a curb and missing his step, he fell onto his left side with left hip pain and rib pain.  Patient denied any syncope, presyncope, headache. In the ED imaging was remarkable for periprosthetic fracture, orthopedics was consulted and recommended transfer to [facility] for surgical repair.    Subjective   Patient seen and examined, yesterday complained of increased left hand numbness.  CT head is negative for stroke.  Patient does have history of bilateral neuropathy and has chronic numbness of both hands.  He feels better today.     Assessment/Plan:    1. S/p ORIF left proximal femur fracture-patient underwent ORIF for left proximal femur fracture, pain is not well controlled.  We will schedule Vicodin 5/325 1 tablet every 8 hours.  orthopedics following.   2. Left hand numbness-likely from neuropathy, patient has chronic numbness of both upper extremities.  No other neurological deficit noted.  CT head was negative for stroke.  Continue to monitor. 3. Hematuria-patient developed hematuria,  likely traumatic from catheter insertion.  Hemoglobin has dropped significantly today to 8.0.  S/p 1 unit PRBC.  He has history of bladder cancer.  Urology has seen the patient, patient was started on bladder irrigation.  Lovenox was discontinued due to ongoing hematuria.  Hematuria has now resolved.  Foley catheter has been removed.  Condom catheter placed.    4. Acute blood loss anemia-patient's hemoglobin has dropped from 13.9-7.5 in 4  days.  Likely from acute blood loss anemia as well as ongoing hematuria as above.  S/p 1 unit PRBC.  Repeat hemoglobin today is 8.2 5. Hypertension-continue home medication daily amlodipine, furosemide is on hold due to poor p.o. intake. 6. Acute kidney injury on CKD stage IIIb-improved after starting IV fluids.  Today creatinine is 1.99.  Follow BMP in a.m.  Renal ultrasound showed no hydronephrosis.  Showed chronic medical renal disease. 7. Hyperlipidemia-continue atorvastatin 8. Diabetes mellitus type 2-hold p.o. medications.  Continue sliding scale insulin with NovoLog.  Blood glucose is elevated.  Lantus changed to 20 units subcu daily.     9. COPD-not in exacerbation, continue Breo 10. Hyperkalemia-potassium was 5.8, he is not on potassium supplementation.  He was given 1 dose of Lokelma.  Today potassium is 5.1.  Follow BMP in a.m.   11. DVT prophylaxis-called and discussed with Dr. [name] office PA .  Dr. [name]  recommended aspirin 325 mg p.o. twice daily for DVT prophylaxis.  But patient developed hematuria with aspirin in 2012.  Discussed with Dr. [name], may not have many choices considering significant hematuria.  We will continue with SCDs for now.  Ortho recommends getting lower extremity venous duplex before discharge from the hospital.     Lab Results  Component Value Date   Lee 04/18/2020     CBG: Recent Labs  Lab 04/24/20 1118 04/24/20 1607 04/24/20 2045 04/25/20 0744 04/25/20 1136  GLUCAP 232* 210* 257* 174* 207*    CBC: Recent Labs  Lab 04/21/20 0504 04/21/20 0504 04/22/20 1209 04/23/20 0915 04/23/20 1646 04/24/20 0252 04/25/20 0340  WBC 9.1  --  9.4 9.0  --  9.3 10.0  HGB 8.0*   < > 7.5* 7.3* 8.2* 8.0* 8.2*  HCT 24.6*   < > 23.2* 22.3* 25.5* 25.3* 25.7*  MCV 102.1*  --  100.9* 103.2*  --  102.8* 104.0*  PLT 183  --  205 217  --  237 268   < > = values in this interval not displayed.    Basic Metabolic Panel: Recent Labs  Lab  04/22/20 0620 04/23/20 0915 04/24/20 0252 04/25/20 0340  NA 135 135 136 134*  K 4.9 4.7 5.8* 5.1  CL 99 101 102 99  CO2 25 28 25 27   GLUCOSE 168* 237* 263* 203*  BUN 60* 45* 42* 44*  CREATININE 2.71* 2.36* 1.98* 1.99*  CALCIUM 7.8* 7.6* 7.7* 7.9*        DVT prophylaxis: Hold Lovenox for ongoing hematuria, SCDs  Code Status: Full code  Family Communication: No family at bedside    Status is: Inpatient  Dispo: The patient is from: Home              Anticipated d/c is to: Skilled nursing facility              Anticipated d/c date is: 04/26/2020              Patient currently medically ready for discharge  Barrier to [employer] authorization to go to CIR.      Scheduled medications:  . amLODipine  2.5 mg Oral Daily  . atorvastatin  10 mg Oral Daily  . Chlorhexidine Gluconate Cloth  6 each Topical Daily  . escitalopram  5 mg Oral Daily  . feeding supplement (GLUCERNA SHAKE)  237 mL Oral BID BM  . fluticasone furoate-vilanterol  1 puff Inhalation Daily  . HYDROcodone-acetaminophen  1 tablet Oral Q8H  . insulin aspart  0-5 Units Subcutaneous QHS  . insulin aspart  0-9 Units Subcutaneous TID WC  . insulin glargine  20 Units Subcutaneous QHS  . multivitamin with minerals  1 tablet Oral Daily  . Ensure Max Protein  11 oz Oral Daily  . senna  1 tablet Oral BID  . umeclidinium bromide  1 puff Inhalation Daily    Consultants:  Orthopedics  Procedures:  ORIF left periprosthetic hip fracture  Antibiotics:   Anti-infectives (From admission, onward)   Start     Dose/Rate Route Frequency Ordered Stop   04/19/20 2200  ceFAZolin (ANCEF) IVPB 2g/100 mL premix        2 g 200 mL/hr over 30 Minutes Intravenous Every 8 hours 04/19/20 2026 04/20/20 1408   04/19/20 1638  vancomycin (VANCOCIN) powder  Status:  Discontinued          As needed 04/19/20 1638 04/19/20 1747   04/19/20 1500  ceFAZolin (ANCEF) IVPB 2g/100 mL premix        2 g 200 mL/hr over  30 Minutes Intravenous  Once 04/19/20 1446 04/19/20 1551   04/19/20 1458  ceFAZolin (ANCEF) 2-4 GM/100ML-% IVPB       Note to Pharmacy: Gleason, Ginger   : cabinet override      04/19/20 1458 04/19/20 1530       Objective   Vitals:   04/25/20 0300 04/25/20 0743 04/25/20 0807 04/25/20 0809  BP: (!) 109/52 (!) 124/57    Pulse: 70 62    Resp: 16 14    Temp: 98 F (36.7 C) 97.9 F (36.6 C)    TempSrc: Axillary Oral    SpO2: 96% 98% 94% 94%  Weight:  Height:        Intake/Output Summary (Last 24 hours) at 04/25/2020 1207 Last data filed at 04/25/2020 7858 Gross per 24 hour  Intake 3585.7 ml  Output 650 ml  Net 2935.7 ml    06/13 1901 - 06/15 0700 In: 3225.7 [I.V.:3225.7] Out: 8502 [Urine:1850]  Filed Weights   04/19/20 0816  Weight: 94.9 kg    Physical Examination:   General-appears in no acute distress Heart-S1-S2, regular, no murmur auscultated Lungs-clear to auscultation bilaterally, no wheezing or crackles auscultated Abdomen-soft, nontender, no organomegaly Extremities-no edema in the lower extremities Neuro-alert, oriented x3, no focal deficit noted  Data Reviewed:   Recent Results (from the past 240 hour(s))  Resp Panel by RT PCR (RSV, Flu A&B, Covid) - Nasopharyngeal Swab     Status: None   Collection Time: 04/18/20 11:36 AM   Specimen: Nasopharyngeal Swab  Result Value Ref Range Status   SARS Coronavirus 2 by RT PCR NEGATIVE NEGATIVE Final    Comment: (NOTE) SARS-CoV-2 target nucleic acids are NOT DETECTED. The SARS-CoV-2 RNA is generally detectable in upper respiratoy specimens during the acute phase of infection. The lowest concentration of SARS-CoV-2 viral copies this assay can detect is 131 copies/mL. A negative result does not preclude SARS-Cov-2 infection and should not be used as the sole basis for treatment or other patient management decisions. A negative result may occur with  improper specimen collection/handling, submission of  specimen other than nasopharyngeal swab, presence of viral mutation(s) within the areas targeted by this assay, and inadequate number of viral copies (<131 copies/mL). A negative result must be combined with clinical observations, patient history, and epidemiological information. The expected result is Negative. Fact Sheet for Patients:  PinkCheek.be Fact Sheet for Healthcare Providers:  GravelBags.it This test is not yet ap proved or cleared by the [place] FDA and  has been authorized for detection and/or diagnosis of SARS-CoV-2 by FDA under an Emergency Use Authorization (EUA). This EUA will remain  in effect (meaning this test can be used) for the duration of the COVID-19 declaration under Section 564(b)(1) of the Act, 21 U.S.C. section 360bbb-3(b)(1), unless the authorization is terminated or revoked sooner.    Influenza A by PCR NEGATIVE NEGATIVE Final   Influenza B by PCR NEGATIVE NEGATIVE Final    Comment: (NOTE) The Xpert Xpress SARS-CoV-2/FLU/RSV assay is intended as an aid in  the diagnosis of influenza from Nasopharyngeal swab specimens and  should not be used as a sole basis for treatment. Nasal washings and  aspirates are unacceptable for Xpert Xpress SARS-CoV-2/FLU/RSV  testing. Fact Sheet for Patients: PinkCheek.be Fact Sheet for Healthcare Providers: GravelBags.it This test is not yet approved or cleared by the [place] FDA and  has been authorized for detection and/or diagnosis of SARS-CoV-2 by  FDA under an Emergency Use Authorization (EUA). This EUA will remain  in effect (meaning this test can be used) for the duration of the  Covid-19 declaration under Section 564(b)(1) of the Act, 21  U.S.C. section 360bbb-3(b)(1), unless the authorization is  terminated or revoked.    Respiratory Syncytial Virus by PCR NEGATIVE NEGATIVE Final     Comment: (NOTE) Fact Sheet for Patients: PinkCheek.be Fact Sheet for Healthcare Providers: GravelBags.it This test is not yet approved or cleared by the [place] FDA and  has been authorized for detection and/or diagnosis of SARS-CoV-2 by  FDA under an Emergency Use Authorization (EUA). This EUA will remain  in effect (meaning this test can be used) for  the duration of the  COVID-19 declaration under Section 564(b)(1) of the Act, 21 U.S.C.  section 360bbb-3(b)(1), unless the authorization is terminated or  revoked. Performed at [facility], [place] [address], [place], [place] [address]   MRSA PCR Screening     Status: None   Collection Time: 04/19/20  5:26 AM   Specimen: Nasal Mucosa; Nasopharyngeal  Result Value Ref Range Status   MRSA by PCR NEGATIVE NEGATIVE Final    Comment:        The GeneXpert MRSA Assay (FDA approved for NASAL specimens only), is one component of a comprehensive MRSA colonization surveillance program. It is not intended to diagnose MRSA infection nor to guide or monitor treatment for MRSA infections. Performed at [facility], [place] [address] [place],  [address]       [facility] If 7PM-7AM, please contact night-coverage at [email or web address], Office  [phone]   04/25/2020, 12:07 PM  LOS: 7 days

## 2020-04-25 NOTE — H&P (Signed)
Physical Medicine and Rehabilitation Admission H&P    Chief Complaint  Patient presents with  . Functional decline due to hip fracture    Left hip pain.     HPI: [name] Small is an 86 year old male with history of CAD, T2DM with neuropathy, HTN, COPD- oxygen prn, CKD who was admitted on 04/18/2020 after fall, landing on his left hip.  Patient fell off the curb onto left hip with reports of hip and rib pain. Work up done revealing oblique peri-prosthetic proximal femur fracture and moderate to advanced right hip OA.  He was placed in Buck's traction and Dr. [name] consulted due to complexity of injury. Patient underwent  ORIF left proximal femur fracture on 04/19/2020 -- to be TDWB on LLE and started on lovenox for DVT prophylaxis.  He has had issues with insomnia, pain and acute on chronic renal failure with rise in SCr to  2.71. Renal ultrasound done 6/12 due to acute on chronic renal failure and showed medical renal disease without nephrosis.  Hospital course complicated by gross hematuria with clots and drop in H/H to  7.3/22.3. he was transfused with one unit PRBC. 3 way foley placed and CBI initiated -- bladder also hand flushed by Dr. Ishmael Holter.  Foley was removed on 06/14 and condom cath in place. He has had worsening of renal status with K+ 5.8 and treated with lokelma yesterday. SCr has been on rise to 2.17. CT head done 04/24/2020 due to lethargy and showed chronic small vessel disease and generalized brain atrophy. Therapy ongoing and he is showing improvement in bed mobility-->was incontinent of bowel this am.  He was immobile over the weekend due to need for CBI but  tolerated up in chair for 1 1/2 hours yesterday. Continues to have pain limiting mobility. Please see preadmission assessment from earlier today as well.   Review of Systems  Constitutional: Negative for chills and fever.       On average does not eat when he feels bad ~ couple days a week.    HENT: Positive for hearing  loss.   Eyes: Positive for blurred vision (problems with right eye). Negative for double vision.  Respiratory: Positive for wheezing. Negative for cough and shortness of breath.   Cardiovascular: Positive for leg swelling. Negative for chest pain and palpitations.  Gastrointestinal: Negative for heartburn and nausea.  Genitourinary: Positive for hematuria.  Musculoskeletal: Positive for falls (frequently when not using the walker) and joint pain.  Neurological: Positive for sensory change (bilateral feet and ) and weakness. Negative for dizziness and headaches.  Psychiatric/Behavioral: Positive for hallucinations. The patient has insomnia (occasionally).   All other systems reviewed and are negative.    Past Medical History:  Diagnosis Date  . (HFpEF) heart failure with preserved ejection fraction ([place])   . Arthritis   . Back pain    s/p lumbar injection 2014  . BENIGN PROSTATIC HYPERTROPHY 05/21/2007  . Bladder cancer ([place]) 10/2011  . CAD (coronary artery disease)    nonobstructive by cath 6/12:  mid LAD 30%, proximal obtuse marginal-2 30%, proximal RCA 20%, mid RCA 30-40%.  He had normal cardiac output and mildly elevated filling pressures but no significant pulmonary hypertension;   Echocardiogram in May 2012 demonstrated EF 50-55% and left atrial enlargement   . Cellulitis of left foot    Hospitalized in 2006  . Chronic kidney disease (CKD), stage III (moderate) 12/04/2007   FOLLOWED BY DR Maanav Kassabian  . Chronic pain of  lower extremity   . Complication of anesthesia    1 time bladder cancer surgery- 2012 , oxygen satruratioon dropped had to stay overnight, no problems since then.  Marland Kitchen COPD (chronic obstructive pulmonary disease) ([place])   . DIABETES MELLITUS, TYPE II 05/21/2007  . DIVERTICULOSIS, COLON 05/21/2007   pt denies  . Dyspnea    with exertion, patient mointors saturation  . Fatigue AGE-RELATED  . Gross hematuria    pt denies  . HYPERTENSION 05/21/2007  . Impaired hearing  BILATERAL HEARING AIDS  . On home oxygen therapy    as needed  . PAD (peripheral artery disease) ([place])    a. 02/2016 L foot nonhealing ulcer-->Periph angio: L pop 100 w/ reconstitution via extensive vollats to the prox-mid peroneal (only patent vessel BK)-->PTA of L Pop and peroneal; b. Ongoing LE ischemia 5 & 06/2016 req amputation of toes on L foot.  . Pneumonia    Hx  of   . Psoriasis ELBOWS  . Psoriasis   . PSORIASIS, SCALP 10/25/2008    Past Surgical History:  Procedure Laterality Date  . AMPUTATION Left 03/27/2016   Procedure: partial first AMPUTATION RAY; LEFT;  Surgeon: Trula Slade, DPM;  Location: [place];  Service: Podiatry;  Laterality: Left;  . AMPUTATION Left 06/12/2016   Procedure: TRANSMETATARSAL AMPUTATION LEFT FOOT;  Surgeon: Newt Minion, MD;  Location: [place];  Service: Orthopedics;  Laterality: Left;  . APPENDECTOMY  1941  . CARDIAC CATHETERIZATION  04-24-11/  DR Rogue Jury ARIDA   MILD NONOBSTRUCTIVE CAD, NORMA CARDIAC OUTPUT  . CARDIOVASCULAR STRESS TEST  2007  . CATARACT EXTRACTION W/ INTRAOCULAR LENS  IMPLANT, BILATERAL Bilateral ~ 2010  . CYSTOSCOPY  12/25/2011   Procedure: CYSTOSCOPY;  Surgeon: Molli Hazard, MD;  Location: [facility];  Service: Urology;  Laterality: N/A;  needs intubation and to be paralyzed   . CYSTOSCOPY W/ RETROGRADES  10/30/2011   Procedure: CYSTOSCOPY WITH RETROGRADE PYELOGRAM;  Surgeon: Molli Hazard, MD;  Location: [facility];  Service: Urology;  Laterality: Bilateral;  CYSTOSCOPY POSS TURBT BILATERAL RETROGRADE PYLEOGRAM   . CYSTOSCOPY W/ RETROGRADES  11/30/2012   Procedure: CYSTOSCOPY WITH RETROGRADE PYELOGRAM;  Surgeon: Molli Hazard, MD;  Location: [facility];  Service: Urology;  Laterality: Bilateral;  Flexible cystoscopy.  . CYSTOSCOPY WITH BIOPSY  11/30/2012   Procedure: CYSTOSCOPY WITH BIOPSY;  Surgeon: Molli Hazard, MD;  Location: [facility];  Service: Urology;  Laterality: N/A;  . INCISION AND DRAINAGE FOOT Left ~ 2005   LEFT FOOT DUE TO INFECTION FROM  NAIL PUNCTURE INJURY  . LUMBAR LAMINECTOMY/DECOMPRESSION MICRODISCECTOMY N/A 10/18/2016   Procedure: LEFT AND CENTRAL L4-5 LUMBAR LAMINECTOMY WITH RESECTION OF SYNOVIAL CYST;  Surgeon: Jessy Oto, MD;  Location: [place];  Service: Orthopedics;  Laterality: N/A;  . ORIF FEMUR FRACTURE Left 04/19/2020   Procedure: OPEN REDUCTION INTERNAL FIXATION FEMORAL SHAFT FRACTURE;  Surgeon: Shona Needles, MD;  Location: [place];  Service: Orthopedics;  Laterality: Left;  . PENILE PROSTHESIS IMPLANT  1990s  . PERIPHERAL VASCULAR CATHETERIZATION N/A 02/14/2016   Procedure: Abdominal Aortogram w/Lower Extremity;  Surgeon: Wellington Hampshire, MD;  Location: [place] [facility];  Service: Cardiovascular;  Laterality: N/A;  . PERIPHERAL VASCULAR CATHETERIZATION Left 02/28/2016   Procedure: Peripheral Vascular Balloon Angioplasty;  Surgeon: Wellington Hampshire, MD;  Location: [place] [facility];  Service: Cardiovascular;  Laterality: Left;  left peroneal and popliteal artery  . TONSILLECTOMY AND  ADENOIDECTOMY  1962  . TOTAL HIP ARTHROPLASTY Left 12/27/2016   Procedure: LEFT TOTAL HIP ARTHROPLASTY ANTERIOR APPROACH;  Surgeon: Mcarthur Rossetti, MD;  Location: [facility];  Service: Orthopedics;  Laterality: Left;  . TRANSURETHRAL RESECTION OF BLADDER TUMOR  10/30/2011   Procedure: TRANSURETHRAL RESECTION OF BLADDER TUMOR (TURBT);  Surgeon: Molli Hazard, MD;  Location: [facility];  Service: Urology;  Laterality: N/A;  . TRANSURETHRAL RESECTION OF BLADDER TUMOR  12/25/2011   Procedure: TRANSURETHRAL RESECTION OF BLADDER TUMOR (TURBT);  Surgeon: Molli Hazard, MD;  Location: [facility];  Service: Urology;  Laterality: N/A;  need long gyrus instruments   . VASECTOMY  1990s    Family History  Problem Relation Age of Onset  . Diabetes Mother   . Heart disease  Brother   . Heart disease Brother   . Heart disease Brother   . Heart disease Brother   . Heart disease Brother   . Heart disease Brother   . Lung cancer Brother     Social History:  Married. 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  Limited mobility--wife assists with shower and to don shoes. Used cane or walker PTA. He reports that he quit smoking about 51 years ago. His smoking use included cigarettes. He has a 44.00 pack-year smoking history. He has never used smokeless tobacco. He drinks bourbon and coke once a week. He does not use drugs.  Allergies  Allergen Reactions  . Actos [Pioglitazone Hydrochloride] Other (See Comments)    Held 2012 bladder cancer  . Aspirin Other (See Comments)    Held 2012 due to hematuria and bruising.   . Ace Inhibitors Cough  . Bactrim Rash and Other (See Comments)    Rash, presumed allergy  . Gabapentin Nausea And Vomiting    Upset stomach and diarrhea - tolerates low doses  . Lyrica [Pregabalin] Swelling    swelling  . Pravastatin Swelling    Swelling of feet  . Sulfa Drugs Cross Reactors Rash    Medications Prior to Admission  Medication Sig Dispense Refill  . acetaminophen (TYLENOL) 500 MG tablet Take 1,000 mg by mouth every 8 (eight) hours as needed for mild pain, moderate pain or headache.    Marland Kitchen amLODipine (NORVASC) 2.5 MG tablet Take 1 tablet (2.5 mg total) by mouth daily. 90 tablet 3  . Apremilast 30 MG TABS Take 1 tablet by mouth daily.    Marland Kitchen atorvastatin (LIPITOR) 10 MG tablet TAKE 1 TABLET BY MOUTH  DAILY (Patient taking differently: Take 10 mg by mouth daily. ) 90 tablet 3  . budesonide-formoterol (SYMBICORT) 160-4.5 MCG/ACT inhaler Inhale 2 puffs into the lungs 2 (two) times daily as needed (shortness of breath).     . clobetasol (TEMOVATE) 0.05 % external solution Apply 1 application topically daily as needed (rash).     . clopidogrel (PLAVIX) 75 MG tablet TAKE 1 TABLET BY MOUTH  DAILY (Patient taking differently: Take 75 mg by mouth  daily. ) 90 tablet 3  . furosemide (LASIX) 40 MG tablet TAKE 1 TABLET BY MOUTH  EVERY MONDAY, WEDNESDAY,  AND FRIDAY (Patient taking differently: Take 40 mg by mouth every Monday, Wednesday, and Friday. ) 39 tablet 3  . glipiZIDE (GLUCOTROL XL) 5 MG 24 hr tablet TAKE 1 TABLET BY MOUTH  DAILY WITH BREAKFAST (Patient taking differently: Take 5 mg by mouth daily with breakfast. ) 90 tablet 3  . insulin glargine (LANTUS) 100 UNIT/ML injection Inject 0.15 mLs (15 Units total) into  the skin daily. (Patient taking differently: Inject 15 Units into the skin at bedtime. ) 10 mL   . nystatin (MYCOSTATIN/NYSTOP) powder Apply to affected area twice a day (Patient taking differently: Apply 1 application topically 2 (two) times daily as needed (fungal infections). ) 30 g 0  . tiotropium (SPIRIVA) 18 MCG inhalation capsule Place 1 capsule (18 mcg total) into inhaler and inhale daily. 30 capsule 12  . escitalopram (LEXAPRO) 5 MG tablet Take 1 tablet (5 mg total) by mouth daily. (Patient not taking: Reported on 01/31/2020) 90 tablet 1    Drug Regimen Review  Drug regimen was reviewed and remains appropriate with no significant issues identified  Home: Home Living Family/patient expects to be discharged to:: Private residence Living Arrangements: Spouse/significant other Available Help at Discharge: Family, Available 24 hours/day Type of Home: House Home Access: Stairs to enter Technical brewer of Steps: 3 Entrance Stairs-Rails: Left Home Layout: One level Bathroom Shower/Tub: Multimedia programmer: Handicapped height Home Equipment: Civil engineer, contracting, Hand held shower head, Grab bars - tub/shower, Environmental consultant - 4 wheels, [place] - single point   Functional History: Prior Function Level of Independence: Needs assistance Gait / Transfers Assistance Needed: walks with a cane or walker ADL's / Homemaking Assistance Needed: assisted for showering, assisted for LE dressing and all IADL  Functional Status:   Mobility: Bed Mobility Overal bed mobility: Needs Assistance Bed Mobility: Supine to Sit, Rolling Rolling: Min assist Supine to sit: Min assist Sit to supine: +2 for physical assistance, Mod assist General bed mobility comments: ModA for rolling side to side and assist with LLE. Transfers Overall transfer level: Needs assistance Equipment used: Ambulation equipment used Transfer via Lift Equipment: Stedy Transfers: Sit to/from Stand Sit to Stand: Mod assist, +2 safety/equipment, From elevated surface General transfer comment: assist for power up Ambulation/Gait Ambulation/Gait assistance:  (NT) General Gait Details: Deferred    ADL: ADL Overall ADL's : Needs assistance/impaired Eating/Feeding: Set up, Bed level Grooming: Set up, Wash/dry hands, Wash/dry face, Bed level Upper Body Bathing: Moderate assistance, Sitting Lower Body Bathing: Total assistance, Bed level Lower Body Bathing Details (indicate cue type and reason): Pt was unaware of BM in bed. Pt unable to assist other than rolling side to side Upper Body Dressing : Moderate assistance, Sitting Lower Body Dressing: Total assistance, Bed level Toileting- Clothing Manipulation and Hygiene: Total assistance, Bed level Toileting - Clothing Manipulation Details (indicate cue type and reason): pt with bowel incontinence Functional mobility during ADLs: Moderate assistance, +2 for physical assistance, +2 for safety/equipment General ADL Comments: Pt limited by [facility] nature, decreased strength, decreased activity tolerance and decreased ability to care for self. Pt's session focused on pericare and transferring to recliner.  Cognition: Cognition Overall Cognitive Status: History of cognitive impairments - at baseline Orientation Level: Oriented X4 Cognition Arousal/Alertness: Awake/alert Behavior During Therapy: WFL for tasks assessed/performed Overall Cognitive Status: History of cognitive impairments - at baseline General  Comments: Pt following most commands; hearing aid in L ear Difficult to assess due to: Hard of hearing/deaf   Blood pressure 119/76, pulse 90, temperature 98 F (36.7 C), temperature source Oral, resp. rate 15, height 6\' 2"  (1.88 m), weight 94.9 kg, SpO2 96 %. Physical Exam  Nursing note and vitals reviewed. Constitutional: No distress.  Slept thorough most of exam--aroused briefly to answer occasional question. . On 2 L oxygen per [place].   HENT:  Head: Normocephalic and atraumatic.  Right Ear: External ear normal.  Left Ear: External ear normal.  Nose:  Nose normal.  Eyes: Right eye exhibits no discharge. Left eye exhibits no discharge.  Respiratory: Effort normal. No stridor. No respiratory distress.  GI: Normal appearance. He exhibits distension. There is no abdominal tenderness.  Slightly tight  Genitourinary:    Genitourinary Comments: condom cath in place with dark urine--? Hematuria.    Musculoskeletal:        General: Swelling present.     Cervical back: Normal range of motion.     Comments: Moderate edema left hip to knee.  Left transmet amputation site well healed.   Neurological: He is alert.  Motor: B/l UE: 5/5 proximal to distal RLE: HF: 2+/5, KE 3/5, ADF 4+/5 LLE: HF 2-/5, KE 2+/5, ADF 3/5 Dysphonia HOH  Skin: Skin is warm and dry.  Healed left TMA  Psychiatric: His affect is blunt. His speech is delayed. He is slowed. He has a flat affect.    Results for orders placed or performed during the hospital encounter of 04/18/20 (from the past 48 hour(s))  Glucose, capillary     Status: Abnormal   Collection Time: 04/24/20  8:45 PM  Result Value Ref Range   Glucose-Capillary 257 (H) 70 - 99 mg/dL    Comment: Glucose reference range applies only to samples taken after fasting for at least 8 hours.  Basic metabolic panel     Status: Abnormal   Collection Time: 04/25/20  3:40 AM  Result Value Ref Range   Sodium 134 (L) 135 - 145 mmol/L   Potassium 5.1 3.5 - 5.1 mmol/L    Chloride 99 98 - 111 mmol/L   CO2 27 22 - 32 mmol/L   Glucose, Bld 203 (H) 70 - 99 mg/dL    Comment: Glucose reference range applies only to samples taken after fasting for at least 8 hours.   BUN 44 (H) 8 - 23 mg/dL   Creatinine, Ser 1.99 (H) 0.61 - 1.24 mg/dL   Calcium 7.9 (L) 8.9 - 10.3 mg/dL   GFR calc non Af Amer 29 (L) >60 mL/min   GFR calc Af Amer 34 (L) >60 mL/min   Anion gap 8 5 - 15    Comment: Performed at [facility] [address], [place], [place] [address]  CBC     Status: Abnormal   Collection Time: 04/25/20  3:40 AM  Result Value Ref Range   WBC 10.0 4.0 - 10.5 K/uL   RBC 2.47 (L) 4.22 - 5.81 MIL/uL   Hemoglobin 8.2 (L) 13.0 - 17.0 g/dL   HCT 25.7 (L) 39 - 52 %   MCV 104.0 (H) 80.0 - 100.0 fL   MCH 33.2 26.0 - 34.0 pg   MCHC 31.9 30.0 - 36.0 g/dL   RDW 12.9 11.5 - 15.5 %   Platelets 268 150 - 400 K/uL   nRBC 0.0 0.0 - 0.2 %    Comment: Performed at [facility], [place] [address], [place], [place] [address]  Glucose, capillary     Status: Abnormal   Collection Time: 04/25/20  7:44 AM  Result Value Ref Range   Glucose-Capillary 174 (H) 70 - 99 mg/dL    Comment: Glucose reference range applies only to samples taken after fasting for at least 8 hours.  Glucose, capillary     Status: Abnormal   Collection Time: 04/25/20 11:36 AM  Result Value Ref Range   Glucose-Capillary 207 (H) 70 - 99 mg/dL    Comment: Glucose reference range applies only to samples taken after fasting  for at least 8 hours.  Glucose, capillary     Status: Abnormal   Collection Time: 04/25/20  4:28 PM  Result Value Ref Range   Glucose-Capillary 168 (H) 70 - 99 mg/dL    Comment: Glucose reference range applies only to samples taken after fasting for at least 8 hours.  Glucose, capillary     Status: Abnormal   Collection Time: 04/25/20  8:53 PM  Result Value Ref Range   Glucose-Capillary 206 (H) 70 - 99 mg/dL    Comment: Glucose reference range applies only to samples taken after  fasting for at least 8 hours.  Glucose, capillary     Status: Abnormal   Collection Time: 04/26/20  8:18 AM  Result Value Ref Range   Glucose-Capillary 109 (H) 70 - 99 mg/dL    Comment: Glucose reference range applies only to samples taken after fasting for at least 8 hours.  Basic metabolic panel     Status: Abnormal   Collection Time: 04/26/20  8:31 AM  Result Value Ref Range   Sodium 135 135 - 145 mmol/L   Potassium 4.6 3.5 - 5.1 mmol/L   Chloride 98 98 - 111 mmol/L   CO2 28 22 - 32 mmol/L   Glucose, Bld 129 (H) 70 - 99 mg/dL    Comment: Glucose reference range applies only to samples taken after fasting for at least 8 hours.   BUN 59 (H) 8 - 23 mg/dL   Creatinine, Ser 2.17 (H) 0.61 - 1.24 mg/dL   Calcium 8.3 (L) 8.9 - 10.3 mg/dL   GFR calc non Af Amer 26 (L) >60 mL/min   GFR calc Af Amer 30 (L) >60 mL/min   Anion gap 9 5 - 15    Comment: Performed at [facility] [address] [place], [place] [address]  Glucose, capillary     Status: Abnormal   Collection Time: 04/26/20 11:12 AM  Result Value Ref Range   Glucose-Capillary 150 (H) 70 - 99 mg/dL    Comment: Glucose reference range applies only to samples taken after fasting for at least 8 hours.   CT HEAD WO CONTRAST  Result Date: 04/24/2020 CLINICAL DATA:  Status post fall.  Neuro deficits. EXAM: CT HEAD WITHOUT CONTRAST TECHNIQUE: Contiguous axial images were obtained from the base of the skull through the vertex without intravenous contrast. COMPARISON:  None. FINDINGS: Brain: No evidence of acute infarction, hemorrhage, hydrocephalus, extra-axial collection or mass lesion/mass effect. There is mild diffuse low-attenuation within the subcortical and periventricular white matter compatible with chronic microvascular disease. Generalized brain atrophy. Vascular: No hyperdense vessel or unexpected calcification. Skull: Normal. Negative for fracture or focal lesion. Sinuses/Orbits: No acute finding. Other: None IMPRESSION: 1.  No acute intracranial abnormalities. 2. Chronic small vessel ischemic change and brain atrophy. Electronically Signed   By: Kerby Moors M.D.   On: 04/24/2020 20:46       Medical Problem List and Plan: 1.  Deficits with mobility, endurance, self-care secondary to left periprosthetic femur fracture.   -patient may not  shower  -ELOS/Goals: 10-14 days/Min A  Admit to CIR 2.  Antithrombotics: -DVT/anticoagulation:  Mechanical: Sequential compression devices, below knee Bilateral lower extremities Will check dopplers.  -antiplatelet therapy: N/A 3. Pain Management: Has been on hydrocodone 5 mg every 8 hours. Received dilaudid early am today.    Monitor with increased exertion 4. Mood: LCSW to follow for evaluation and support.   -antipsychotic agents: N/a 5. Neuropsych: This patient is not fully  capable of making decisions on his own behalf. 6. Skin/Wound Care: Monitor wound for healing.  7. Fluids/Electrolytes/Nutrition: Monitor I/O. CMP ordered for tomorrow AM. 8. HTN: Monitor BP tid- continue Norvasc.  Monitor with increased mobility.  9. T2DM with hyperglycemia: Monitor BS ac/hs.  Lantus increased to 15 units bid today. Now on Lantus bid with SSI for elevated BS.   Monitor with increased mobility.  10. COPD: Wean oxygen as tolerated. Continue Breo/Incruse.  11. Acute on chronic renal failure: Trending to baseline line? Will order PVRs to monitor voiding.   Labs ordered for tomorrow AM. 12. ABLA/Hematuria: Continues--will continue to monitor H/H. Off Plavix and Lovenox at this time.   CBC ordered for tomorrow AM.  Bary Leriche, PA-C 04/26/2020  I have personally performed a face to face diagnostic evaluation, including, but not limited to relevant history and physical exam findings, of this patient and developed relevant assessment and plan.  Additionally, I have reviewed and concur with the physician assistant's documentation above.  Delice Lesch, MD, ABPMR

## 2020-04-25 NOTE — Progress Notes (Signed)
Inpatient Rehabilitation-Admissions Coordinator   Received notification from pt's insurance company that the patient's request for CIR has been approved. Unfortunately, I do not have a bed available today for this patient in IP Rehab. Will follow up tomorrow for possible admit, pending bed availability.   Raechel Ache, OTR/L  Rehab Admissions Coordinator  [phone] 04/25/2020 1:23 PM

## 2020-04-25 NOTE — Progress Notes (Signed)
Inpatient Rehabilitation-Admissions Coordinator   Met with pt and his wife in the room. Pt sitting up in the recliner and alert. Confirmed continued interest in CIR program. Reviewed insurance benefits if pt was to be approved with all questions answered.   Still awaiting an insurance determination for possible CIR admit. Will follow up once there has been a determination.   Raechel Ache, OTR/L  Rehab Admissions Coordinator  [phone] 04/25/2020 11:12 AM

## 2020-04-25 NOTE — Plan of Care (Signed)
  Problem: Education: °Goal: Verbalization of understanding the information provided (i.e., activity precautions, restrictions, etc) will improve °Outcome: Progressing °  °Problem: Activity: °Goal: Ability to ambulate and perform ADLs will improve °Outcome: Progressing °  °Problem: Clinical Measurements: °Goal: Postoperative complications will be avoided or minimized °Outcome: Progressing °  °Problem: Self-Concept: °Goal: Ability to maintain and perform role responsibilities to the fullest extent possible will improve °Outcome: Progressing °  °

## 2020-04-26 ENCOUNTER — Inpatient Hospital Stay (HOSPITAL_COMMUNITY)
Admission: RE | Admit: 2020-04-26 | Discharge: 2020-05-12 | DRG: 560 | Disposition: A | Discharge status: Home Health | Payer: Medicare Other | Source: Intra-hospital | Attending: Physical Medicine and Rehabilitation | Admitting: Physical Medicine and Rehabilitation

## 2020-04-26 ENCOUNTER — Encounter (HOSPITAL_COMMUNITY): Payer: Self-pay | Admitting: Internal Medicine

## 2020-04-26 DIAGNOSIS — E1122 Type 2 diabetes mellitus with diabetic chronic kidney disease: Secondary | ICD-10-CM | POA: Diagnosis not present

## 2020-04-26 DIAGNOSIS — S31801A Laceration without foreign body of unspecified buttock, initial encounter: Secondary | ICD-10-CM | POA: Diagnosis not present

## 2020-04-26 DIAGNOSIS — R319 Hematuria, unspecified: Secondary | ICD-10-CM | POA: Diagnosis present

## 2020-04-26 DIAGNOSIS — W19XXXA Unspecified fall, initial encounter: Secondary | ICD-10-CM | POA: Diagnosis not present

## 2020-04-26 DIAGNOSIS — R159 Full incontinence of feces: Secondary | ICD-10-CM | POA: Diagnosis present

## 2020-04-26 DIAGNOSIS — E875 Hyperkalemia: Secondary | ICD-10-CM | POA: Diagnosis present

## 2020-04-26 DIAGNOSIS — F4320 Adjustment disorder, unspecified: Secondary | ICD-10-CM | POA: Diagnosis not present

## 2020-04-26 DIAGNOSIS — E1165 Type 2 diabetes mellitus with hyperglycemia: Secondary | ICD-10-CM

## 2020-04-26 DIAGNOSIS — M978XXA Periprosthetic fracture around other internal prosthetic joint, initial encounter: Secondary | ICD-10-CM

## 2020-04-26 DIAGNOSIS — R339 Retention of urine, unspecified: Secondary | ICD-10-CM

## 2020-04-26 DIAGNOSIS — Z794 Long term (current) use of insulin: Secondary | ICD-10-CM | POA: Diagnosis not present

## 2020-04-26 DIAGNOSIS — M9702XA Periprosthetic fracture around internal prosthetic left hip joint, initial encounter: Secondary | ICD-10-CM | POA: Diagnosis not present

## 2020-04-26 DIAGNOSIS — I2510 Atherosclerotic heart disease of native coronary artery without angina pectoris: Secondary | ICD-10-CM | POA: Diagnosis not present

## 2020-04-26 DIAGNOSIS — I5030 Unspecified diastolic (congestive) heart failure: Secondary | ICD-10-CM | POA: Diagnosis not present

## 2020-04-26 DIAGNOSIS — K5900 Constipation, unspecified: Secondary | ICD-10-CM | POA: Diagnosis present

## 2020-04-26 DIAGNOSIS — D62 Acute posthemorrhagic anemia: Secondary | ICD-10-CM | POA: Diagnosis present

## 2020-04-26 DIAGNOSIS — Z79899 Other long term (current) drug therapy: Secondary | ICD-10-CM

## 2020-04-26 DIAGNOSIS — Z7902 Long term (current) use of antithrombotics/antiplatelets: Secondary | ICD-10-CM | POA: Diagnosis not present

## 2020-04-26 DIAGNOSIS — Z09 Encounter for follow-up examination after completed treatment for conditions other than malignant neoplasm: Secondary | ICD-10-CM

## 2020-04-26 DIAGNOSIS — I739 Peripheral vascular disease, unspecified: Secondary | ICD-10-CM | POA: Diagnosis not present

## 2020-04-26 DIAGNOSIS — Z974 Presence of external hearing-aid: Secondary | ICD-10-CM

## 2020-04-26 DIAGNOSIS — Z888 Allergy status to other drugs, medicaments and biological substances status: Secondary | ICD-10-CM

## 2020-04-26 DIAGNOSIS — Z961 Presence of intraocular lens: Secondary | ICD-10-CM | POA: Diagnosis present

## 2020-04-26 DIAGNOSIS — R310 Gross hematuria: Secondary | ICD-10-CM

## 2020-04-26 DIAGNOSIS — E1169 Type 2 diabetes mellitus with other specified complication: Secondary | ICD-10-CM

## 2020-04-26 DIAGNOSIS — Z882 Allergy status to sulfonamides status: Secondary | ICD-10-CM | POA: Diagnosis not present

## 2020-04-26 DIAGNOSIS — R490 Dysphonia: Secondary | ICD-10-CM | POA: Diagnosis not present

## 2020-04-26 DIAGNOSIS — M978XXS Periprosthetic fracture around other internal prosthetic joint, sequela: Secondary | ICD-10-CM | POA: Diagnosis not present

## 2020-04-26 DIAGNOSIS — S7292XA Unspecified fracture of left femur, initial encounter for closed fracture: Secondary | ICD-10-CM | POA: Diagnosis present

## 2020-04-26 DIAGNOSIS — D72829 Elevated white blood cell count, unspecified: Secondary | ICD-10-CM | POA: Diagnosis not present

## 2020-04-26 DIAGNOSIS — M9702XD Periprosthetic fracture around internal prosthetic left hip joint, subsequent encounter: Secondary | ICD-10-CM | POA: Diagnosis not present

## 2020-04-26 DIAGNOSIS — Z96649 Presence of unspecified artificial hip joint: Secondary | ICD-10-CM | POA: Diagnosis not present

## 2020-04-26 DIAGNOSIS — Z9842 Cataract extraction status, left eye: Secondary | ICD-10-CM

## 2020-04-26 DIAGNOSIS — N390 Urinary tract infection, site not specified: Secondary | ICD-10-CM | POA: Diagnosis not present

## 2020-04-26 DIAGNOSIS — Z833 Family history of diabetes mellitus: Secondary | ICD-10-CM | POA: Diagnosis not present

## 2020-04-26 DIAGNOSIS — Z96642 Presence of left artificial hip joint: Secondary | ICD-10-CM | POA: Diagnosis present

## 2020-04-26 DIAGNOSIS — Z471 Aftercare following joint replacement surgery: Secondary | ICD-10-CM | POA: Diagnosis not present

## 2020-04-26 DIAGNOSIS — N179 Acute kidney failure, unspecified: Secondary | ICD-10-CM

## 2020-04-26 DIAGNOSIS — N184 Chronic kidney disease, stage 4 (severe): Secondary | ICD-10-CM | POA: Diagnosis not present

## 2020-04-26 DIAGNOSIS — E11649 Type 2 diabetes mellitus with hypoglycemia without coma: Secondary | ICD-10-CM | POA: Diagnosis not present

## 2020-04-26 DIAGNOSIS — Z96641 Presence of right artificial hip joint: Secondary | ICD-10-CM | POA: Diagnosis not present

## 2020-04-26 DIAGNOSIS — Z87891 Personal history of nicotine dependence: Secondary | ICD-10-CM

## 2020-04-26 DIAGNOSIS — R0600 Dyspnea, unspecified: Secondary | ICD-10-CM | POA: Diagnosis present

## 2020-04-26 DIAGNOSIS — Z9841 Cataract extraction status, right eye: Secondary | ICD-10-CM

## 2020-04-26 DIAGNOSIS — E1140 Type 2 diabetes mellitus with diabetic neuropathy, unspecified: Secondary | ICD-10-CM | POA: Diagnosis present

## 2020-04-26 DIAGNOSIS — T148XXA Other injury of unspecified body region, initial encounter: Secondary | ICD-10-CM

## 2020-04-26 DIAGNOSIS — J449 Chronic obstructive pulmonary disease, unspecified: Secondary | ICD-10-CM | POA: Diagnosis not present

## 2020-04-26 DIAGNOSIS — I10 Essential (primary) hypertension: Secondary | ICD-10-CM | POA: Diagnosis present

## 2020-04-26 DIAGNOSIS — Y92239 Unspecified place in hospital as the place of occurrence of the external cause: Secondary | ICD-10-CM | POA: Diagnosis not present

## 2020-04-26 DIAGNOSIS — Z8249 Family history of ischemic heart disease and other diseases of the circulatory system: Secondary | ICD-10-CM

## 2020-04-26 DIAGNOSIS — I130 Hypertensive heart and chronic kidney disease with heart failure and stage 1 through stage 4 chronic kidney disease, or unspecified chronic kidney disease: Secondary | ICD-10-CM | POA: Diagnosis present

## 2020-04-26 DIAGNOSIS — N183 Chronic kidney disease, stage 3 unspecified: Secondary | ICD-10-CM | POA: Diagnosis present

## 2020-04-26 DIAGNOSIS — B961 Klebsiella pneumoniae [K. pneumoniae] as the cause of diseases classified elsewhere: Secondary | ICD-10-CM | POA: Diagnosis not present

## 2020-04-26 DIAGNOSIS — G8918 Other acute postprocedural pain: Secondary | ICD-10-CM

## 2020-04-26 DIAGNOSIS — S728X2D Other fracture of left femur, subsequent encounter for closed fracture with routine healing: Secondary | ICD-10-CM | POA: Diagnosis not present

## 2020-04-26 DIAGNOSIS — Z89432 Acquired absence of left foot: Secondary | ICD-10-CM

## 2020-04-26 DIAGNOSIS — I509 Heart failure, unspecified: Secondary | ICD-10-CM | POA: Diagnosis not present

## 2020-04-26 DIAGNOSIS — I5032 Chronic diastolic (congestive) heart failure: Secondary | ICD-10-CM | POA: Diagnosis not present

## 2020-04-26 DIAGNOSIS — S51011A Laceration without foreign body of right elbow, initial encounter: Secondary | ICD-10-CM | POA: Diagnosis not present

## 2020-04-26 DIAGNOSIS — H9190 Unspecified hearing loss, unspecified ear: Secondary | ICD-10-CM | POA: Diagnosis present

## 2020-04-26 DIAGNOSIS — Z7951 Long term (current) use of inhaled steroids: Secondary | ICD-10-CM | POA: Diagnosis not present

## 2020-04-26 DIAGNOSIS — S79911A Unspecified injury of right hip, initial encounter: Secondary | ICD-10-CM | POA: Diagnosis not present

## 2020-04-26 DIAGNOSIS — W101XXD Fall (on)(from) sidewalk curb, subsequent encounter: Secondary | ICD-10-CM | POA: Diagnosis present

## 2020-04-26 DIAGNOSIS — Z8551 Personal history of malignant neoplasm of bladder: Secondary | ICD-10-CM

## 2020-04-26 DIAGNOSIS — R062 Wheezing: Secondary | ICD-10-CM | POA: Diagnosis not present

## 2020-04-26 DIAGNOSIS — Z801 Family history of malignant neoplasm of trachea, bronchus and lung: Secondary | ICD-10-CM | POA: Diagnosis not present

## 2020-04-26 DIAGNOSIS — N400 Enlarged prostate without lower urinary tract symptoms: Secondary | ICD-10-CM | POA: Diagnosis present

## 2020-04-26 DIAGNOSIS — J9600 Acute respiratory failure, unspecified whether with hypoxia or hypercapnia: Secondary | ICD-10-CM | POA: Diagnosis not present

## 2020-04-26 DIAGNOSIS — E1129 Type 2 diabetes mellitus with other diabetic kidney complication: Secondary | ICD-10-CM | POA: Diagnosis not present

## 2020-04-26 DIAGNOSIS — G8929 Other chronic pain: Secondary | ICD-10-CM | POA: Diagnosis present

## 2020-04-26 DIAGNOSIS — G4700 Insomnia, unspecified: Secondary | ICD-10-CM | POA: Diagnosis present

## 2020-04-26 DIAGNOSIS — M978XXD Periprosthetic fracture around other internal prosthetic joint, subsequent encounter: Secondary | ICD-10-CM | POA: Diagnosis not present

## 2020-04-26 DIAGNOSIS — E1151 Type 2 diabetes mellitus with diabetic peripheral angiopathy without gangrene: Secondary | ICD-10-CM | POA: Diagnosis present

## 2020-04-26 DIAGNOSIS — S7222XA Displaced subtrochanteric fracture of left femur, initial encounter for closed fracture: Principal | ICD-10-CM

## 2020-04-26 DIAGNOSIS — M1611 Unilateral primary osteoarthritis, right hip: Secondary | ICD-10-CM | POA: Diagnosis present

## 2020-04-26 DIAGNOSIS — G319 Degenerative disease of nervous system, unspecified: Secondary | ICD-10-CM | POA: Diagnosis present

## 2020-04-26 DIAGNOSIS — I959 Hypotension, unspecified: Secondary | ICD-10-CM | POA: Diagnosis not present

## 2020-04-26 DIAGNOSIS — Z9981 Dependence on supplemental oxygen: Secondary | ICD-10-CM | POA: Diagnosis not present

## 2020-04-26 DIAGNOSIS — R0609 Other forms of dyspnea: Secondary | ICD-10-CM | POA: Diagnosis present

## 2020-04-26 LAB — BASIC METABOLIC PANEL
Anion gap: 9 (ref 5–15)
BUN: 59 mg/dL — ABNORMAL HIGH (ref 8–23)
CO2: 28 mmol/L (ref 22–32)
Calcium: 8.3 mg/dL — ABNORMAL LOW (ref 8.9–10.3)
Chloride: 98 mmol/L (ref 98–111)
Creatinine, Ser: 2.17 mg/dL — ABNORMAL HIGH (ref 0.61–1.24)
GFR calc Af Amer: 30 mL/min — ABNORMAL LOW (ref >60)
GFR calc non Af Amer: 26 mL/min — ABNORMAL LOW (ref >60)
Glucose, Bld: 129 mg/dL — ABNORMAL HIGH (ref 70–99)
Potassium: 4.6 mmol/L (ref 3.5–5.1)
Sodium: 135 mmol/L (ref 135–145)

## 2020-04-26 LAB — GLUCOSE, CAPILLARY
Glucose-Capillary: 109 mg/dL — ABNORMAL HIGH (ref 70–99)
Glucose-Capillary: 150 mg/dL — ABNORMAL HIGH (ref 70–99)
Glucose-Capillary: 226 mg/dL — ABNORMAL HIGH (ref 70–99)
Glucose-Capillary: 179 mg/dL — ABNORMAL HIGH (ref 70–99)

## 2020-04-26 MED ORDER — GUAIFENESIN-DM 100-10 MG/5ML PO SYRP: 5.0000 mL | ORAL_SOLUTION | Freq: Four times a day (QID) | ORAL | Status: DC | PRN

## 2020-04-26 MED ORDER — DIPHENHYDRAMINE HCL 12.5 MG/5ML PO ELIX: 12.5000 mg | ORAL_SOLUTION | Freq: Four times a day (QID) | ORAL | Status: DC | PRN

## 2020-04-26 MED ORDER — INSULIN ASPART 100 UNIT/ML ~~LOC~~ SOLN
0.0000 [IU] | Freq: Every day | SUBCUTANEOUS | Status: DC
  Administered 2020-05-01 – 2020-05-10 (×4): 2 [IU] via SUBCUTANEOUS

## 2020-04-26 MED ORDER — FLUTICASONE FUROATE-VILANTEROL 200-25 MCG/INH IN AEPB
1.0000 | INHALATION_SPRAY | Freq: Every day | RESPIRATORY_TRACT | Status: DC
  Administered 2020-04-27 – 2020-05-12 (×13): 1 via RESPIRATORY_TRACT
  Filled 2020-04-26: qty 28

## 2020-04-26 MED ORDER — INSULIN GLARGINE 100 UNIT/ML ~~LOC~~ SOLN
15.0000 [IU] | Freq: Two times a day (BID) | SUBCUTANEOUS | Status: DC
  Administered 2020-04-26: 15 [IU] via SUBCUTANEOUS
  Filled 2020-04-26 (×2): qty 0.15

## 2020-04-26 MED ORDER — PROCHLORPERAZINE MALEATE 5 MG PO TABS: 5.0000 mg | ORAL_TABLET | Freq: Four times a day (QID) | ORAL | Status: DC | PRN

## 2020-04-26 MED ORDER — PROCHLORPERAZINE EDISYLATE 10 MG/2ML IJ SOLN: 5.0000 mg | Freq: Four times a day (QID) | INTRAMUSCULAR | Status: DC | PRN

## 2020-04-26 MED ORDER — LIDOCAINE HCL URETHRAL/MUCOSAL 2 % EX GEL
CUTANEOUS | Status: DC | PRN
  Administered 2020-05-01: 1 via TOPICAL
  Administered 2020-05-02 – 2020-05-11 (×4): 5 via TOPICAL
  Filled 2020-04-26 (×9): qty 5
  Filled 2020-04-26 (×2): qty 10
  Filled 2020-04-26 (×8): qty 5

## 2020-04-26 MED ORDER — ATORVASTATIN CALCIUM 10 MG PO TABS
10.0000 mg | ORAL_TABLET | Freq: Every day | ORAL | Status: DC
  Administered 2020-04-27 – 2020-05-12 (×16): 10 mg via ORAL
  Filled 2020-04-26 (×16): qty 1

## 2020-04-26 MED ORDER — HYDROMORPHONE HCL 1 MG/ML IJ SOLN: 0.5000 mg | INTRAMUSCULAR | Status: DC | PRN

## 2020-04-26 MED ORDER — AMLODIPINE BESYLATE 2.5 MG PO TABS
2.5000 mg | ORAL_TABLET | Freq: Every day | ORAL | Status: DC
  Administered 2020-04-27: 2.5 mg via ORAL
  Filled 2020-04-26: qty 1

## 2020-04-26 MED ORDER — SENNA 8.6 MG PO TABS
1.0000 | ORAL_TABLET | Freq: Two times a day (BID) | ORAL | Status: DC
  Administered 2020-04-26 – 2020-04-27 (×2): 8.6 mg via ORAL
  Filled 2020-04-26 (×2): qty 1

## 2020-04-26 MED ORDER — UMECLIDINIUM BROMIDE 62.5 MCG/INH IN AEPB
1.0000 | INHALATION_SPRAY | Freq: Every day | RESPIRATORY_TRACT | Status: DC
  Administered 2020-04-27 – 2020-05-12 (×13): 1 via RESPIRATORY_TRACT
  Filled 2020-04-26 (×2): qty 7

## 2020-04-26 MED ORDER — GLUCERNA SHAKE PO LIQD
237.0000 mL | Freq: Two times a day (BID) | ORAL | Status: DC
  Administered 2020-04-27 – 2020-05-05 (×16): 237 mL via ORAL

## 2020-04-26 MED ORDER — POLYETHYLENE GLYCOL 3350 17 G PO PACK
17.0000 g | PACK | Freq: Every day | ORAL | Status: DC | PRN
  Administered 2020-05-03: 17 g via ORAL
  Filled 2020-04-26: qty 1

## 2020-04-26 MED ORDER — PROCHLORPERAZINE 25 MG RE SUPP: 12.5000 mg | Freq: Four times a day (QID) | RECTAL | Status: DC | PRN

## 2020-04-26 MED ORDER — ACETAMINOPHEN 325 MG PO TABS
325.0000 mg | ORAL_TABLET | ORAL | Status: DC | PRN
  Administered 2020-04-27 – 2020-05-12 (×7): 650 mg via ORAL
  Filled 2020-04-26 (×5): qty 2
  Filled 2020-04-26: qty 1
  Filled 2020-04-26 (×3): qty 2

## 2020-04-26 MED ORDER — ADULT MULTIVITAMIN W/MINERALS CH
1.0000 | ORAL_TABLET | Freq: Every day | ORAL | Status: DC
  Administered 2020-04-27 – 2020-05-12 (×17): 1 via ORAL
  Filled 2020-04-26 (×16): qty 1

## 2020-04-26 MED ORDER — ESCITALOPRAM OXALATE 10 MG PO TABS
5.0000 mg | ORAL_TABLET | Freq: Every day | ORAL | Status: DC
  Administered 2020-04-27 – 2020-05-12 (×16): 5 mg via ORAL
  Filled 2020-04-26 (×16): qty 1

## 2020-04-26 MED ORDER — INSULIN GLARGINE 100 UNIT/ML ~~LOC~~ SOLN
15.0000 [IU] | Freq: Two times a day (BID) | SUBCUTANEOUS | Status: DC
  Administered 2020-04-26 – 2020-04-28 (×4): 15 [IU] via SUBCUTANEOUS
  Filled 2020-04-26 (×5): qty 0.15

## 2020-04-26 MED ORDER — POLYSACCHARIDE IRON COMPLEX 150 MG PO CAPS
150.0000 mg | ORAL_CAPSULE | Freq: Every day | ORAL | Status: DC
  Administered 2020-04-26 – 2020-05-11 (×16): 150 mg via ORAL
  Filled 2020-04-26 (×17): qty 1

## 2020-04-26 MED ORDER — ALUM & MAG HYDROXIDE-SIMETH 200-200-20 MG/5ML PO SUSP: 30.0000 mL | ORAL | Status: DC | PRN

## 2020-04-26 MED ORDER — POLYETHYLENE GLYCOL 3350 17 G PO PACK: 17.0000 g | PACK | Freq: Every day | ORAL | Status: DC | PRN

## 2020-04-26 MED ORDER — FLEET ENEMA 7-19 GM/118ML RE ENEM
1.0000 | ENEMA | Freq: Once | RECTAL | Status: AC | PRN
  Administered 2020-04-30: 1 via RECTAL
  Filled 2020-04-26: qty 1

## 2020-04-26 MED ORDER — METHOCARBAMOL 500 MG PO TABS
500.0000 mg | ORAL_TABLET | Freq: Four times a day (QID) | ORAL | Status: DC | PRN
  Administered 2020-04-27 – 2020-05-08 (×16): 500 mg via ORAL
  Filled 2020-04-26 (×16): qty 1

## 2020-04-26 MED ORDER — MELATONIN 5 MG PO TABS
5.0000 mg | ORAL_TABLET | Freq: Every evening | ORAL | Status: DC | PRN
  Administered 2020-04-27 – 2020-05-11 (×6): 5 mg via ORAL
  Filled 2020-04-26 (×7): qty 1

## 2020-04-26 MED ORDER — BISACODYL 10 MG RE SUPP
10.0000 mg | Freq: Every day | RECTAL | Status: DC | PRN
  Administered 2020-05-03: 10 mg via RECTAL
  Filled 2020-04-26: qty 1

## 2020-04-26 MED ORDER — ENSURE MAX PROTEIN PO LIQD
11.0000 [oz_av] | Freq: Every day | ORAL | Status: DC
  Administered 2020-04-27 – 2020-05-05 (×9): 11 [oz_av] via ORAL
  Filled 2020-04-26 (×10): qty 330

## 2020-04-26 MED ORDER — INSULIN ASPART 100 UNIT/ML ~~LOC~~ SOLN
0.0000 [IU] | Freq: Three times a day (TID) | SUBCUTANEOUS | Status: DC
  Administered 2020-04-26: 3 [IU] via SUBCUTANEOUS
  Administered 2020-04-27: 2 [IU] via SUBCUTANEOUS
  Administered 2020-04-28 (×2): 1 [IU] via SUBCUTANEOUS
  Administered 2020-04-30: 5 [IU] via SUBCUTANEOUS
  Administered 2020-04-30: 2 [IU] via SUBCUTANEOUS
  Administered 2020-05-01: 3 [IU] via SUBCUTANEOUS
  Administered 2020-05-01: 1 [IU] via SUBCUTANEOUS
  Administered 2020-05-01: 5 [IU] via SUBCUTANEOUS
  Administered 2020-05-02: 2 [IU] via SUBCUTANEOUS
  Administered 2020-05-03: 1 [IU] via SUBCUTANEOUS
  Administered 2020-05-03: 2 [IU] via SUBCUTANEOUS
  Administered 2020-05-04: 3 [IU] via SUBCUTANEOUS
  Administered 2020-05-04 – 2020-05-05 (×2): 2 [IU] via SUBCUTANEOUS
  Administered 2020-05-05: 3 [IU] via SUBCUTANEOUS
  Administered 2020-05-05: 1 [IU] via SUBCUTANEOUS
  Administered 2020-05-06 (×2): 2 [IU] via SUBCUTANEOUS
  Administered 2020-05-07 – 2020-05-09 (×4): 1 [IU] via SUBCUTANEOUS
  Administered 2020-05-10: 2 [IU] via SUBCUTANEOUS
  Administered 2020-05-11: 1 [IU] via SUBCUTANEOUS

## 2020-04-26 MED ORDER — TRAZODONE HCL 50 MG PO TABS
25.0000 mg | ORAL_TABLET | Freq: Every evening | ORAL | Status: DC | PRN
  Administered 2020-04-28 – 2020-05-11 (×10): 50 mg via ORAL
  Filled 2020-04-26 (×10): qty 1

## 2020-04-26 MED ORDER — HYDROCODONE-ACETAMINOPHEN 5-325 MG PO TABS
1.0000 | ORAL_TABLET | Freq: Three times a day (TID) | ORAL | Status: DC
  Administered 2020-04-26 – 2020-04-27 (×2): 1 via ORAL
  Filled 2020-04-26 (×2): qty 1

## 2020-04-26 NOTE — H&P (Signed)
Physical Medicine and Rehabilitation Admission H&P    Chief Complaint  Patient presents with  . Functional decline due to hip fracture    Left hip pain.     HPI: [name] Small is an 86 year old male with history of CAD, T2DM with neuropathy, HTN, COPD- oxygen prn, CKD who was admitted on 04/18/2020 after fall, landing on his left hip.  Patient fell off the curb onto left hip with reports of hip and rib pain. Work up done revealing oblique peri-prosthetic proximal femur fracture and moderate to advanced right hip OA.  He was placed in Buck's traction and Dr. [name] consulted due to complexity of injury. Patient underwent  ORIF left proximal femur fracture on 04/19/2020 -- to be TDWB on LLE and started on lovenox for DVT prophylaxis.  He has had issues with insomnia, pain and acute on chronic renal failure with rise in SCr to  2.71. Renal ultrasound done 6/12 due to acute on chronic renal failure and showed medical renal disease without nephrosis.  Hospital course complicated by gross hematuria with clots and drop in H/H to  7.3/22.3. he was transfused with one unit PRBC. 3 way foley placed and CBI initiated -- bladder also hand flushed by Dr. Ishmael Holter.  Foley was removed on 06/14 and condom cath in place. He has had worsening of renal status with K+ 5.8 and treated with lokelma yesterday. SCr has been on rise to 2.17. CT head done 04/24/2020 due to lethargy and showed chronic small vessel disease and generalized brain atrophy. Therapy ongoing and he is showing improvement in bed mobility-->was incontinent of bowel this am.  He was immobile over the weekend due to need for CBI but  tolerated up in chair for 1 1/2 hours yesterday. Continues to have pain limiting mobility. Please see preadmission assessment from earlier today as well.   Review of Systems  Constitutional: Negative for chills and fever.       On average does not eat when he feels bad ~ couple days a week.    HENT: Positive for hearing  loss.   Eyes: Positive for blurred vision (problems with right eye). Negative for double vision.  Respiratory: Positive for wheezing. Negative for cough and shortness of breath.   Cardiovascular: Positive for leg swelling. Negative for chest pain and palpitations.  Gastrointestinal: Negative for heartburn and nausea.  Genitourinary: Positive for hematuria.  Musculoskeletal: Positive for falls (frequently when not using the walker) and joint pain.  Neurological: Positive for sensory change (bilateral feet and ) and weakness. Negative for dizziness and headaches.  Psychiatric/Behavioral: Positive for hallucinations. The patient has insomnia (occasionally).   All other systems reviewed and are negative.    Past Medical History:  Diagnosis Date  . (HFpEF) heart failure with preserved ejection fraction ([place])   . Arthritis   . Back pain    s/p lumbar injection 2014  . BENIGN PROSTATIC HYPERTROPHY 05/21/2007  . Bladder cancer ([place]) 10/2011  . CAD (coronary artery disease)    nonobstructive by cath 6/12:  mid LAD 30%, proximal obtuse marginal-2 30%, proximal RCA 20%, mid RCA 30-40%.  He had normal cardiac output and mildly elevated filling pressures but no significant pulmonary hypertension;   Echocardiogram in May 2012 demonstrated EF 50-55% and left atrial enlargement   . Cellulitis of left foot    Hospitalized in 2006  . Chronic kidney disease (CKD), stage III (moderate) 12/04/2007   FOLLOWED BY DR Nerine Pulse  . Chronic pain of  lower extremity   . Complication of anesthesia    1 time bladder cancer surgery- 2012 , oxygen satruratioon dropped had to stay overnight, no problems since then.  Marland Kitchen COPD (chronic obstructive pulmonary disease) ([place])   . DIABETES MELLITUS, TYPE II 05/21/2007  . DIVERTICULOSIS, COLON 05/21/2007   pt denies  . Dyspnea    with exertion, patient mointors saturation  . Fatigue AGE-RELATED  . Gross hematuria    pt denies  . HYPERTENSION 05/21/2007  . Impaired hearing  BILATERAL HEARING AIDS  . On home oxygen therapy    as needed  . PAD (peripheral artery disease) ([place])    a. 02/2016 L foot nonhealing ulcer-->Periph angio: L pop 100 w/ reconstitution via extensive vollats to the prox-mid peroneal (only patent vessel BK)-->PTA of L Pop and peroneal; b. Ongoing LE ischemia 5 & 06/2016 req amputation of toes on L foot.  . Pneumonia    Hx  of   . Psoriasis ELBOWS  . Psoriasis   . PSORIASIS, SCALP 10/25/2008    Past Surgical History:  Procedure Laterality Date  . AMPUTATION Left 03/27/2016   Procedure: partial first AMPUTATION RAY; LEFT;  Surgeon: Trula Slade, DPM;  Location: [facility];  Service: Podiatry;  Laterality: Left;  . AMPUTATION Left 06/12/2016   Procedure: TRANSMETATARSAL AMPUTATION LEFT FOOT;  Surgeon: Newt Minion, MD;  Location: [facility];  Service: Orthopedics;  Laterality: Left;  . APPENDECTOMY  1941  . CARDIAC CATHETERIZATION  04-24-11/  DR Rogue Jury ARIDA   MILD NONOBSTRUCTIVE CAD, NORMA CARDIAC OUTPUT  . CARDIOVASCULAR STRESS TEST  2007  . CATARACT EXTRACTION W/ INTRAOCULAR LENS  IMPLANT, BILATERAL Bilateral ~ 2010  . CYSTOSCOPY  12/25/2011   Procedure: CYSTOSCOPY;  Surgeon: Molli Hazard, MD;  Location: [facility];  Service: Urology;  Laterality: N/A;  needs intubation and to be paralyzed   . CYSTOSCOPY W/ RETROGRADES  10/30/2011   Procedure: CYSTOSCOPY WITH RETROGRADE PYELOGRAM;  Surgeon: Molli Hazard, MD;  Location: [facility];  Service: Urology;  Laterality: Bilateral;  CYSTOSCOPY POSS TURBT BILATERAL RETROGRADE PYLEOGRAM   . CYSTOSCOPY W/ RETROGRADES  11/30/2012   Procedure: CYSTOSCOPY WITH RETROGRADE PYELOGRAM;  Surgeon: Molli Hazard, MD;  Location[facility];  Service: Urology;  Laterality: Bilateral;  Flexible cystoscopy.  . CYSTOSCOPY WITH BIOPSY  11/30/2012   Procedure: CYSTOSCOPY WITH BIOPSY;  Surgeon: Molli Hazard, MD;  Location: [facility];  Service: Urology;  Laterality: N/A;  . INCISION AND DRAINAGE FOOT Left ~ 2005   LEFT FOOT DUE TO INFECTION FROM  NAIL PUNCTURE INJURY  . LUMBAR LAMINECTOMY/DECOMPRESSION MICRODISCECTOMY N/A 10/18/2016   Procedure: LEFT AND CENTRAL L4-5 LUMBAR LAMINECTOMY WITH RESECTION OF SYNOVIAL CYST;  Surgeon: Jessy Oto, MD;  Location: [facility];  Service: Orthopedics;  Laterality: N/A;  . ORIF FEMUR FRACTURE Left 04/19/2020   Procedure: OPEN REDUCTION INTERNAL FIXATION FEMORAL SHAFT FRACTURE;  Surgeon: Shona Needles, MD;  Location: [place] [facility];  Service: Orthopedics;  Laterality: Left;  . PENILE PROSTHESIS IMPLANT  1990s  . PERIPHERAL VASCULAR CATHETERIZATION N/A 02/14/2016   Procedure: Abdominal Aortogram w/Lower Extremity;  Surgeon: Wellington Hampshire, MD;  Location: [facility];  Service: Cardiovascular;  Laterality: N/A;  . PERIPHERAL VASCULAR CATHETERIZATION Left 02/28/2016   Procedure: Peripheral Vascular Balloon Angioplasty;  Surgeon: Wellington Hampshire, MD;  Location: [facility];  Service: Cardiovascular;  Laterality: Left;  left peroneal and popliteal artery  . TONSILLECTOMY AND  ADENOIDECTOMY  1962  . TOTAL HIP ARTHROPLASTY Left 12/27/2016   Procedure: LEFT TOTAL HIP ARTHROPLASTY ANTERIOR APPROACH;  Surgeon: Mcarthur Rossetti, MD;  Location: [facility];  Service: Orthopedics;  Laterality: Left;  . TRANSURETHRAL RESECTION OF BLADDER TUMOR  10/30/2011   Procedure: TRANSURETHRAL RESECTION OF BLADDER TUMOR (TURBT);  Surgeon: Molli Hazard, MD;  Location: [facility];  Service: Urology;  Laterality: N/A;  . TRANSURETHRAL RESECTION OF BLADDER TUMOR  12/25/2011   Procedure: TRANSURETHRAL RESECTION OF BLADDER TUMOR (TURBT);  Surgeon: Molli Hazard, MD;  Location: [facility];  Service: Urology;  Laterality: N/A;  need long gyrus instruments   . VASECTOMY  1990s    Family History  Problem Relation Age of Onset  . Diabetes Mother   . Heart disease  Brother   . Heart disease Brother   . Heart disease Brother   . Heart disease Brother   . Heart disease Brother   . Heart disease Brother   . Lung cancer Brother     Social History:  Married. 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  Limited mobility--wife assists with shower and to don shoes. Used cane or walker PTA. He reports that he quit smoking about 51 years ago. His smoking use included cigarettes. He has a 44.00 pack-year smoking history. He has never used smokeless tobacco. He drinks bourbon and coke once a week. He does not use drugs.  Allergies  Allergen Reactions  . Actos [Pioglitazone Hydrochloride] Other (See Comments)    Held 2012 bladder cancer  . Aspirin Other (See Comments)    Held 2012 due to hematuria and bruising.   . Ace Inhibitors Cough  . Bactrim Rash and Other (See Comments)    Rash, presumed allergy  . Gabapentin Nausea And Vomiting    Upset stomach and diarrhea - tolerates low doses  . Lyrica [Pregabalin] Swelling    swelling  . Pravastatin Swelling    Swelling of feet  . Sulfa Drugs Cross Reactors Rash    Medications Prior to Admission  Medication Sig Dispense Refill  . acetaminophen (TYLENOL) 500 MG tablet Take 1,000 mg by mouth every 8 (eight) hours as needed for mild pain, moderate pain or headache.    Marland Kitchen amLODipine (NORVASC) 2.5 MG tablet Take 1 tablet (2.5 mg total) by mouth daily. 90 tablet 3  . Apremilast 30 MG TABS Take 1 tablet by mouth daily.    Marland Kitchen atorvastatin (LIPITOR) 10 MG tablet TAKE 1 TABLET BY MOUTH  DAILY (Patient taking differently: Take 10 mg by mouth daily. ) 90 tablet 3  . budesonide-formoterol (SYMBICORT) 160-4.5 MCG/ACT inhaler Inhale 2 puffs into the lungs 2 (two) times daily as needed (shortness of breath).     . clobetasol (TEMOVATE) 0.05 % external solution Apply 1 application topically daily as needed (rash).     . clopidogrel (PLAVIX) 75 MG tablet TAKE 1 TABLET BY MOUTH  DAILY (Patient taking differently: Take 75 mg by mouth  daily. ) 90 tablet 3  . furosemide (LASIX) 40 MG tablet TAKE 1 TABLET BY MOUTH  EVERY MONDAY, WEDNESDAY,  AND FRIDAY (Patient taking differently: Take 40 mg by mouth every Monday, Wednesday, and Friday. ) 39 tablet 3  . glipiZIDE (GLUCOTROL XL) 5 MG 24 hr tablet TAKE 1 TABLET BY MOUTH  DAILY WITH BREAKFAST (Patient taking differently: Take 5 mg by mouth daily with breakfast. ) 90 tablet 3  . insulin glargine (LANTUS) 100 UNIT/ML injection Inject 0.15 mLs (15 Units total) into  the skin daily. (Patient taking differently: Inject 15 Units into the skin at bedtime. ) 10 mL   . nystatin (MYCOSTATIN/NYSTOP) powder Apply to affected area twice a day (Patient taking differently: Apply 1 application topically 2 (two) times daily as needed (fungal infections). ) 30 g 0  . tiotropium (SPIRIVA) 18 MCG inhalation capsule Place 1 capsule (18 mcg total) into inhaler and inhale daily. 30 capsule 12  . escitalopram (LEXAPRO) 5 MG tablet Take 1 tablet (5 mg total) by mouth daily. (Patient not taking: Reported on 01/31/2020) 90 tablet 1    Drug Regimen Review  Drug regimen was reviewed and remains appropriate with no significant issues identified  Home: Home Living Family/patient expects to be discharged to:: Private residence Living Arrangements: Spouse/significant other Available Help at Discharge: Family, Available 24 hours/day Type of Home: House Home Access: Stairs to enter Technical brewer of Steps: 3 Entrance Stairs-Rails: Left Home Layout: One level Bathroom Shower/Tub: Multimedia programmer: Handicapped height Home Equipment: Civil engineer, contracting, Hand held shower head, Grab bars - tub/shower, Environmental consultant - 4 wheels, [place] - single point   Functional History: Prior Function Level of Independence: Needs assistance Gait / Transfers Assistance Needed: walks with a cane or walker ADL's / Homemaking Assistance Needed: assisted for showering, assisted for LE dressing and all IADL  Functional Status:   Mobility: Bed Mobility Overal bed mobility: Needs Assistance Bed Mobility: Supine to Sit, Rolling Rolling: Min assist Supine to sit: Min assist Sit to supine: +2 for physical assistance, Mod assist General bed mobility comments: ModA for rolling side to side and assist with LLE. Transfers Overall transfer level: Needs assistance Equipment used: Ambulation equipment used Transfer via Lift Equipment: Stedy Transfers: Sit to/from Stand Sit to Stand: Mod assist, +2 safety/equipment, From elevated surface General transfer comment: assist for power up Ambulation/Gait Ambulation/Gait assistance:  (NT) General Gait Details: Deferred    ADL: ADL Overall ADL's : Needs assistance/impaired Eating/Feeding: Set up, Bed level Grooming: Set up, Wash/dry hands, Wash/dry face, Bed level Upper Body Bathing: Moderate assistance, Sitting Lower Body Bathing: Total assistance, Bed level Lower Body Bathing Details (indicate cue type and reason): Pt was unaware of BM in bed. Pt unable to assist other than rolling side to side Upper Body Dressing : Moderate assistance, Sitting Lower Body Dressing: Total assistance, Bed level Toileting- Clothing Manipulation and Hygiene: Total assistance, Bed level Toileting - Clothing Manipulation Details (indicate cue type and reason): pt with bowel incontinence Functional mobility during ADLs: Moderate assistance, +2 for physical assistance, +2 for safety/equipment General ADL Comments: Pt limited by [facility] nature, decreased strength, decreased activity tolerance and decreased ability to care for self. Pt's session focused on pericare and transferring to recliner.  Cognition: Cognition Overall Cognitive Status: History of cognitive impairments - at baseline Orientation Level: Oriented X4 Cognition Arousal/Alertness: Awake/alert Behavior During Therapy: WFL for tasks assessed/performed Overall Cognitive Status: History of cognitive impairments - at baseline General  Comments: Pt following most commands; hearing aid in L ear Difficult to assess due to: Hard of hearing/deaf   Blood pressure 119/76, pulse 90, temperature 98 F (36.7 C), temperature source Oral, resp. rate 15, height 6\' 2"  (1.88 m), weight 94.9 kg, SpO2 96 %. Physical Exam  Nursing note and vitals reviewed. Constitutional: No distress.  Slept thorough most of exam--aroused briefly to answer occasional question. . On 2 L oxygen per [place].   HENT:  Head: Normocephalic and atraumatic.  Right Ear: External ear normal.  Left Ear: External ear normal.  Nose:  Nose normal.  Eyes: Right eye exhibits no discharge. Left eye exhibits no discharge.  Respiratory: Effort normal. No stridor. No respiratory distress.  GI: Normal appearance. He exhibits distension. There is no abdominal tenderness.  Slightly tight  Genitourinary:    Genitourinary Comments: condom cath in place with dark urine--? Hematuria.    Musculoskeletal:        General: Swelling present.     Cervical back: Normal range of motion.     Comments: Moderate edema left hip to knee.  Left transmet amputation site well healed.   Neurological: He is alert.  Motor: B/l UE: 5/5 proximal to distal RLE: HF: 2+/5, KE 3/5, ADF 4+/5 LLE: HF 2-/5, KE 2+/5, ADF 3/5 Dysphonia HOH  Skin: Skin is warm and dry.  Healed left TMA  Psychiatric: His affect is blunt. His speech is delayed. He is slowed. He has a flat affect.    Results for orders placed or performed during the hospital encounter of 04/18/20 (from the past 48 hour(s))  Glucose, capillary     Status: Abnormal   Collection Time: 04/24/20  8:45 PM  Result Value Ref Range   Glucose-Capillary 257 (H) 70 - 99 mg/dL    Comment: Glucose reference range applies only to samples taken after fasting for at least 8 hours.  Basic metabolic panel     Status: Abnormal   Collection Time: 04/25/20  3:40 AM  Result Value Ref Range   Sodium 134 (L) 135 - 145 mmol/L   Potassium 5.1 3.5 - 5.1 mmol/L    Chloride 99 98 - 111 mmol/L   CO2 27 22 - 32 mmol/L   Glucose, Bld 203 (H) 70 - 99 mg/dL    Comment: Glucose reference range applies only to samples taken after fasting for at least 8 hours.   BUN 44 (H) 8 - 23 mg/dL   Creatinine, Ser 1.99 (H) 0.61 - 1.24 mg/dL   Calcium 7.9 (L) 8.9 - 10.3 mg/dL   GFR calc non Af Amer 29 (L) >60 mL/min   GFR calc Af Amer 34 (L) >60 mL/min   Anion gap 8 5 - 15    Comment: Performed at [facility] [address] [place], [place] [address]  CBC     Status: Abnormal   Collection Time: 04/25/20  3:40 AM  Result Value Ref Range   WBC 10.0 4.0 - 10.5 K/uL   RBC 2.47 (L) 4.22 - 5.81 MIL/uL   Hemoglobin 8.2 (L) 13.0 - 17.0 g/dL   HCT 25.7 (L) 39 - 52 %   MCV 104.0 (H) 80.0 - 100.0 fL   MCH 33.2 26.0 - 34.0 pg   MCHC 31.9 30.0 - 36.0 g/dL   RDW 12.9 11.5 - 15.5 %   Platelets 268 150 - 400 K/uL   nRBC 0.0 0.0 - 0.2 %    Comment: Performed at [facility], [place] [address] [place], [place] [address]  Glucose, capillary     Status: Abnormal   Collection Time: 04/25/20  7:44 AM  Result Value Ref Range   Glucose-Capillary 174 (H) 70 - 99 mg/dL    Comment: Glucose reference range applies only to samples taken after fasting for at least 8 hours.  Glucose, capillary     Status: Abnormal   Collection Time: 04/25/20 11:36 AM  Result Value Ref Range   Glucose-Capillary 207 (H) 70 - 99 mg/dL    Comment: Glucose reference range applies only to samples taken after fasting  for at least 8 hours.  Glucose, capillary     Status: Abnormal   Collection Time: 04/25/20  4:28 PM  Result Value Ref Range   Glucose-Capillary 168 (H) 70 - 99 mg/dL    Comment: Glucose reference range applies only to samples taken after fasting for at least 8 hours.  Glucose, capillary     Status: Abnormal   Collection Time: 04/25/20  8:53 PM  Result Value Ref Range   Glucose-Capillary 206 (H) 70 - 99 mg/dL    Comment: Glucose reference range applies only to samples taken after  fasting for at least 8 hours.  Glucose, capillary     Status: Abnormal   Collection Time: 04/26/20  8:18 AM  Result Value Ref Range   Glucose-Capillary 109 (H) 70 - 99 mg/dL    Comment: Glucose reference range applies only to samples taken after fasting for at least 8 hours.  Basic metabolic panel     Status: Abnormal   Collection Time: 04/26/20  8:31 AM  Result Value Ref Range   Sodium 135 135 - 145 mmol/L   Potassium 4.6 3.5 - 5.1 mmol/L   Chloride 98 98 - 111 mmol/L   CO2 28 22 - 32 mmol/L   Glucose, Bld 129 (H) 70 - 99 mg/dL    Comment: Glucose reference range applies only to samples taken after fasting for at least 8 hours.   BUN 59 (H) 8 - 23 mg/dL   Creatinine, Ser 2.17 (H) 0.61 - 1.24 mg/dL   Calcium 8.3 (L) 8.9 - 10.3 mg/dL   GFR calc non Af Amer 26 (L) >60 mL/min   GFR calc Af Amer 30 (L) >60 mL/min   Anion gap 9 5 - 15    Comment: Performed at [facility] [address] [place], [place] [address]  Glucose, capillary     Status: Abnormal   Collection Time: 04/26/20 11:12 AM  Result Value Ref Range   Glucose-Capillary 150 (H) 70 - 99 mg/dL    Comment: Glucose reference range applies only to samples taken after fasting for at least 8 hours.   CT HEAD WO CONTRAST  Result Date: 04/24/2020 CLINICAL DATA:  Status post fall.  Neuro deficits. EXAM: CT HEAD WITHOUT CONTRAST TECHNIQUE: Contiguous axial images were obtained from the base of the skull through the vertex without intravenous contrast. COMPARISON:  None. FINDINGS: Brain: No evidence of acute infarction, hemorrhage, hydrocephalus, extra-axial collection or mass lesion/mass effect. There is mild diffuse low-attenuation within the subcortical and periventricular white matter compatible with chronic microvascular disease. Generalized brain atrophy. Vascular: No hyperdense vessel or unexpected calcification. Skull: Normal. Negative for fracture or focal lesion. Sinuses/Orbits: No acute finding. Other: None IMPRESSION: 1.  No acute intracranial abnormalities. 2. Chronic small vessel ischemic change and brain atrophy. Electronically Signed   By: Kerby Moors M.D.   On: 04/24/2020 20:46       Medical Problem List and Plan: 1.  Deficits with mobility, endurance, self-care secondary to left periprosthetic femur fracture.   -patient may not  shower  -ELOS/Goals: 10-14 days/Min A  Admit to CIR 2.  Antithrombotics: -DVT/anticoagulation:  Mechanical: Sequential compression devices, below knee Bilateral lower extremities Will check dopplers.  -antiplatelet therapy: N/A 3. Pain Management: Has been on hydrocodone 5 mg every 8 hours. Received dilaudid early am today.    Monitor with increased exertion 4. Mood: LCSW to follow for evaluation and support.   -antipsychotic agents: N/a 5. Neuropsych: This patient is not fully  capable of making decisions on his own behalf. 6. Skin/Wound Care: Monitor wound for healing.  7. Fluids/Electrolytes/Nutrition: Monitor I/O. CMP ordered for tomorrow AM. 8. HTN: Monitor BP tid- continue Norvasc.  Monitor with increased mobility.  9. T2DM with hyperglycemia: Monitor BS ac/hs.  Lantus increased to 15 units bid today. Now on Lantus bid with SSI for elevated BS.   Monitor with increased mobility.  10. COPD: Wean oxygen as tolerated. Continue Breo/Incruse.  11. Acute on chronic renal failure: Trending to baseline line? Will order PVRs to monitor voiding.   Labs ordered for tomorrow AM. 12. ABLA/Hematuria: Continues--will continue to monitor H/H. Off Plavix and Lovenox at this time.   CBC ordered for tomorrow AM.  Bary Leriche, PA-C 04/26/2020  I have personally performed a face to face diagnostic evaluation, including, but not limited to relevant history and physical exam findings, of this patient and developed relevant assessment and plan.  Additionally, I have reviewed and concur with the physician assistant's documentation above.  Delice Lesch, MD, ABPMR  The patient's status  has not changed. The original post admission physician evaluation remains appropriate, and any changes from the pre-admission screening or documentation from the acute chart are noted above.   Delice Lesch, MD, ABPMR

## 2020-04-26 NOTE — Discharge Summary (Signed)
Physician Discharge Summary  [name] Small ZOX:[id number] DOB: 31-Aug-1928 DOA: 04/18/2020  PCP: Tonia Ghent, MD  Admit date: 04/18/2020 Discharge date: 04/26/2020  Admitted From: Home Disposition:  CIR  Recommendations for Outpatient Follow-up:  1. Follow up with PCP in 1-2 weeks 2. Please obtain BMP/CBC in one week   Home Health:No Equipment/Devices:none  Discharge Condition:stable CODE STATUS:Full Diet recommendation: Heart Healthy  Brief/Interim Summary: 86 y.o. male past medical history of nonobstructive CAD, essential hypertension diverticulosis non-insulin-dependent diabetes mellitus type 2 COPD on room air chronic kidney disease stage IIIb heart failure diastolic status post 0R IFN 2018 comes into the ED after mechanical fall at home fell onto her left side and developed left hip and rib pain.  Imaging in the ED showed periprostatic fracture orthopedic surgery was consulted   Discharge Diagnoses:  Principal Problem:   Closed left hip fracture, initial encounter [facility]) Active Problems:   Atrial fibrillation ([place])  Mechanical fall leading to left proximal femur fracture: Orthopedic surgery was consulted to perform ORIF R femur, he was started on narcotics which his pain remained controlled and started on MiraLAX and he had a bowel movement. Physical therapy evaluated the patient recommended inpatient rehab.  Left arm numbness likely due to neuropathy: There is a chronic problem CT scan of the head was negative for stroke.  Traumatic hematuria: Likely due to Foley urology was consulted Lovenox was held he had bladder irrigation his hematuria resolved follow-up with urology as an outpatient.  Acute blood loss anemia: With a hemoglobin drop from 14 to 7.5 in 4 days, status post 1 unit of packed red blood cells his hemoglobin remained stable at 9.2.  Essential hypertension: No changes were made to his medication.  Acute kidney injury on chronic kidney disease stage  IIIb: Baseline creatinine of 1.6-1.9 resolved with IV fluid hydration likely prerenal azotemia.  Insulin-dependent diabetes mellitus type 2: No changes made to his medication.  Hyperkalemia: With a potassium of 5.8 Patient was given one dose of Lokelma likely due to hypovolemia resolved with IV fluid hydration and Lokelma.  Discharge Instructions  Discharge Instructions    Diet - low sodium heart healthy   Complete by: As directed    Increase activity slowly   Complete by: As directed    No wound care   Complete by: As directed      Allergies as of 04/26/2020      Reactions   Actos [pioglitazone Hydrochloride] Other (See Comments)   Held 2012 bladder cancer   Aspirin Other (See Comments)   Held 2012 due to hematuria and bruising.    Ace Inhibitors Cough   Bactrim Rash, Other (See Comments)   Rash, presumed allergy   Gabapentin Nausea And Vomiting   Upset stomach and diarrhea - tolerates low doses   Lyrica [pregabalin] Swelling   swelling   Pravastatin Swelling   Swelling of feet   Sulfa Drugs Cross Reactors Rash      Medication List    TAKE these medications   acetaminophen 500 MG tablet Commonly known as: TYLENOL Take 1,000 mg by mouth every 8 (eight) hours as needed for mild pain, moderate pain or headache.   amLODipine 2.5 MG tablet Commonly known as: NORVASC Take 1 tablet (2.5 mg total) by mouth daily.   Apremilast 30 MG Tabs Take 1 tablet by mouth daily.   atorvastatin 10 MG tablet Commonly known as: LIPITOR TAKE 1 TABLET BY MOUTH  DAILY   budesonide-formoterol 160-4.5 MCG/ACT inhaler Commonly known as:  SYMBICORT Inhale 2 puffs into the lungs 2 (two) times daily as needed (shortness of breath).   clobetasol 0.05 % external solution Commonly known as: TEMOVATE Apply 1 application topically daily as needed (rash).   clopidogrel 75 MG tablet Commonly known as: PLAVIX TAKE 1 TABLET BY MOUTH  DAILY   escitalopram 5 MG tablet Commonly known as:  Lexapro Take 1 tablet (5 mg total) by mouth daily.   furosemide 40 MG tablet Commonly known as: LASIX TAKE 1 TABLET BY MOUTH  EVERY MONDAY, WEDNESDAY,  AND FRIDAY What changed: See the new instructions.   glipiZIDE 5 MG 24 hr tablet Commonly known as: GLUCOTROL XL TAKE 1 TABLET BY MOUTH  DAILY WITH BREAKFAST   HYDROcodone-acetaminophen 5-325 MG tablet Commonly known as: NORCO/VICODIN Take 1 tablet by mouth every 6 (six) hours as needed for severe pain.   insulin glargine 100 UNIT/ML injection Commonly known as: LANTUS Inject 0.15 mLs (15 Units total) into the skin daily. What changed: when to take this   methocarbamol 500 MG tablet Commonly known as: ROBAXIN Take 1 tablet (500 mg total) by mouth every 6 (six) hours as needed for muscle spasms.   nystatin powder Commonly known as: MYCOSTATIN/NYSTOP Apply to affected area twice a day What changed:   how much to take  how to take this  when to take this  reasons to take this  additional instructions   tiotropium 18 MCG inhalation capsule Commonly known as: SPIRIVA Place 1 capsule (18 mcg total) into inhaler and inhale daily.       Allergies  Allergen Reactions  . Actos [Pioglitazone Hydrochloride] Other (See Comments)    Held 2012 bladder cancer  . Aspirin Other (See Comments)    Held 2012 due to hematuria and bruising.   . Ace Inhibitors Cough  . Bactrim Rash and Other (See Comments)    Rash, presumed allergy  . Gabapentin Nausea And Vomiting    Upset stomach and diarrhea - tolerates low doses  . Lyrica [Pregabalin] Swelling    swelling  . Pravastatin Swelling    Swelling of feet  . Sulfa Drugs Cross Reactors Rash    Consultations:  Orhtopedic surgery  Urology   Procedures/Studies: DG Chest 1 View  Result Date: 04/18/2020 CLINICAL DATA:  Pain following fall EXAM: CHEST  1 VIEW COMPARISON:  September 25, 2018 FINDINGS: Lungs are clear. Heart size and pulmonary vascularity are normal. No  adenopathy. There is aortic atherosclerosis. No pneumothorax. No fracture noted in the chest region. IMPRESSION: Lungs clear. Cardiac silhouette normal. Aortic Atherosclerosis (ICD10-I70.0). Electronically Signed   By: Lowella Grip III M.D.   On: 04/18/2020 13:44   CT HEAD WO CONTRAST  Result Date: 04/24/2020 CLINICAL DATA:  Status post fall.  Neuro deficits. EXAM: CT HEAD WITHOUT CONTRAST TECHNIQUE: Contiguous axial images were obtained from the base of the skull through the vertex without intravenous contrast. COMPARISON:  None. FINDINGS: Brain: No evidence of acute infarction, hemorrhage, hydrocephalus, extra-axial collection or mass lesion/mass effect. There is mild diffuse low-attenuation within the subcortical and periventricular white matter compatible with chronic microvascular disease. Generalized brain atrophy. Vascular: No hyperdense vessel or unexpected calcification. Skull: Normal. Negative for fracture or focal lesion. Sinuses/Orbits: No acute finding. Other: None IMPRESSION: 1. No acute intracranial abnormalities. 2. Chronic small vessel ischemic change and brain atrophy. Electronically Signed   By: Kerby Moors M.D.   On: 04/24/2020 20:46   US RENAL  Result Date: 04/22/2020 CLINICAL DATA:  Acute renal injury EXAM: RENAL /  URINARY TRACT ULTRASOUND COMPLETE COMPARISON:  05/18/2019 FINDINGS: Right Kidney: Renal measurements: 11.9 x 5.8 x 5.7 cm. = volume: 207 mL. Cortical thinning is noted. Increased echogenicity is seen as well. No hydronephrosis is noted. Left Kidney: Renal measurements: 12.4 x 6.1 x 6.4 cm. = volume: 254 mL. Cortical thinning with increased echogenicity is noted. No hydronephrosis is noted. Bladder: Decompressed by Foley catheter. Other: None. IMPRESSION: Changes consistent with medical renal disease. No acute abnormality is noted. Electronically Signed   By: Inez Catalina M.D.   On: 04/22/2020 16:01   DG Pelvis Portable  Result Date: 04/18/2020 CLINICAL DATA:  Left  hip pain after fall EXAM: PORTABLE PELVIS 1-2 VIEWS COMPARISON:  12/27/2016 FINDINGS: There is an obliquely oriented periprosthetic fracture deformity involving the proximal left femur. Fracture line extends into the greater trochanter. The fracture fragments are in near anatomic alignment. Moderate to advanced right hip osteoarthritis. Penile prosthesis again noted IMPRESSION: Acute obliquely oriented periprosthetic fracture deformity involves the proximal left femur. Electronically Signed   By: Kerby Moors M.D.   On: 04/18/2020 11:37   DG C-Arm 1-60 Min  Result Date: 04/19/2020 CLINICAL DATA:  Left femur repair. EXAM: LEFT FEMUR 2 VIEWS; DG C-ARM 1-60 MIN COMPARISON:  Preoperative radiograph yesterday FINDINGS: Eleven fluoroscopic spot views in the operating room obtained. Lateral plate with locking screws as well as cerclage wire fixation of periprosthetic femur fracture. Total fluoroscopy time is 93 seconds. Total dose 14.73 mGy IMPRESSION: Procedural fluoroscopy during periprosthetic femur fracture fixation. Electronically Signed   By: Keith Rake M.D.   On: 04/19/2020 19:37   DG Hip Unilat W or Wo Pelvis 2-3 Views Left  Result Date: 04/18/2020 CLINICAL DATA:  Pain following fall EXAM: DG HIP (WITH OR WITHOUT PELVIS) 2-3V LEFT COMPARISON:  Pelvis radiograph April 18, 2020 FINDINGS: Frontal and lateral views obtained. There is a total hip replacement on the left. There is a comminuted fracture of the left proximal femur. Specifically, there is an obliquely oriented periprostatic fracture medially which extends into the proximal femoral diaphysis. There is medial displacement of the distal aspect of this fracture. There is also avulsion along the lateral aspect of the greater trochanter. Appearance stable compared to earlier in the day. No dislocation. No erosive change. Penile prosthesis noted. IMPRESSION: Total hip replacement on the left with fractures along the greater tuberosity and medial  proximal femoral diaphysis. The displacement of fracture fragments is stable compared to earlier in the day. The alignment of the total hip replacement is unchanged with prosthetic components remaining well seated. No dislocation. Electronically Signed   By: Lowella Grip III M.D.   On: 04/18/2020 13:43   DG FEMUR MIN 2 VIEWS LEFT  Result Date: 04/19/2020 CLINICAL DATA:  Left femur repair. EXAM: LEFT FEMUR 2 VIEWS; DG C-ARM 1-60 MIN COMPARISON:  Preoperative radiograph yesterday FINDINGS: Eleven fluoroscopic spot views in the operating room obtained. Lateral plate with locking screws as well as cerclage wire fixation of periprosthetic femur fracture. Total fluoroscopy time is 93 seconds. Total dose 14.73 mGy IMPRESSION: Procedural fluoroscopy during periprosthetic femur fracture fixation. Electronically Signed   By: Keith Rake M.D.   On: 04/19/2020 19:37   DG FEMUR PORT MIN 2 VIEWS LEFT  Result Date: 04/19/2020 CLINICAL DATA:  Postop. EXAM: LEFT FEMUR PORTABLE 2 VIEWS COMPARISON:  Preoperative radiograph yesterday FINDINGS: Lateral plate and multi screw fixation of periprosthetic left proximal femur fracture. Additional cerclage wires. Left hip arthroplasty remains. Recent postsurgical change includes air and edema in the  adjacent soft tissues. IMPRESSION: Lateral plate and screw fixation of periprosthetic left proximal femur fracture. Electronically Signed   By: Keith Rake M.D.   On: 04/19/2020 19:41    (Echo, Carotid, EGD, Colonoscopy, ERCP)    Subjective:   Discharge Exam: Vitals:   04/26/20 0817 04/26/20 0846  BP: (!) 108/52   Pulse: 69   Resp: 16   Temp: 99.1 F (37.3 C)   SpO2: 95% 95%   Vitals:   04/25/20 1947 04/26/20 0300 04/26/20 0817 04/26/20 0846  BP: 127/71 134/77 (!) 108/52   Pulse: 71 69 69   Resp: 18 16 16    Temp: 98.3 F (36.8 C) 98.3 F (36.8 C) 99.1 F (37.3 C)   TempSrc: Oral Oral Oral   SpO2: 97% 98% 95% 95%  Weight:      Height:         General: Pt is alert, awake, not in acute distress Cardiovascular: RRR, S1/S2 +, no rubs, no gallops Respiratory: CTA bilaterally, no wheezing, no rhonchi Abdominal: Soft, NT, ND, bowel sounds + Extremities: no edema, no cyanosis    The results of significant diagnostics from this hospitalization (including imaging, microbiology, ancillary and laboratory) are listed below for reference.     Microbiology: Recent Results (from the past 240 hour(s))  Resp Panel by RT PCR (RSV, Flu A&B, Covid) - Nasopharyngeal Swab     Status: None   Collection Time: 04/18/20 11:36 AM   Specimen: Nasopharyngeal Swab  Result Value Ref Range Status   SARS Coronavirus 2 by RT PCR NEGATIVE NEGATIVE Final    Comment: (NOTE) SARS-CoV-2 target nucleic acids are NOT DETECTED. The SARS-CoV-2 RNA is generally detectable in upper respiratoy specimens during the acute phase of infection. The lowest concentration of SARS-CoV-2 viral copies this assay can detect is 131 copies/mL. A negative result does not preclude SARS-Cov-2 infection and should not be used as the sole basis for treatment or other patient management decisions. A negative result may occur with  improper specimen collection/handling, submission of specimen other than nasopharyngeal swab, presence of viral mutation(s) within the areas targeted by this assay, and inadequate number of viral copies (<131 copies/mL). A negative result must be combined with clinical observations, patient history, and epidemiological information. The expected result is Negative. Fact Sheet for Patients:  PinkCheek.be Fact Sheet for Healthcare Providers:  GravelBags.it This test is not yet ap proved or cleared by the [place] FDA and  has been authorized for detection and/or diagnosis of SARS-CoV-2 by FDA under an Emergency Use Authorization (EUA). This EUA will remain  in effect (meaning this test can  be used) for the duration of the COVID-19 declaration under Section 564(b)(1) of the Act, 21 U.S.C. section 360bbb-3(b)(1), unless the authorization is terminated or revoked sooner.    Influenza A by PCR NEGATIVE NEGATIVE Final   Influenza B by PCR NEGATIVE NEGATIVE Final    Comment: (NOTE) The Xpert Xpress SARS-CoV-2/FLU/RSV assay is intended as an aid in  the diagnosis of influenza from Nasopharyngeal swab specimens and  should not be used as a sole basis for treatment. Nasal washings and  aspirates are unacceptable for Xpert Xpress SARS-CoV-2/FLU/RSV  testing. Fact Sheet for Patients: PinkCheek.be Fact Sheet for Healthcare Providers: GravelBags.it This test is not yet approved or cleared by the [place] FDA and  has been authorized for detection and/or diagnosis of SARS-CoV-2 by  FDA under an Emergency Use Authorization (EUA). This EUA will remain  in effect (meaning this test can be  used) for the duration of the  Covid-19 declaration under Section 564(b)(1) of the Act, 21  U.S.C. section 360bbb-3(b)(1), unless the authorization is  terminated or revoked.    Respiratory Syncytial Virus by PCR NEGATIVE NEGATIVE Final    Comment: (NOTE) Fact Sheet for Patients: PinkCheek.be Fact Sheet for Healthcare Providers: GravelBags.it This test is not yet approved or cleared by the [place] FDA and  has been authorized for detection and/or diagnosis of SARS-CoV-2 by  FDA under an Emergency Use Authorization (EUA). This EUA will remain  in effect (meaning this test can be used) for the duration of the  COVID-19 declaration under Section 564(b)(1) of the Act, 21 U.S.C.  section 360bbb-3(b)(1), unless the authorization is terminated or  revoked. Performed at [facility], [place] [address], [place], [place] [address]   MRSA PCR Screening     Status:  None   Collection Time: 04/19/20  5:26 AM   Specimen: Nasal Mucosa; Nasopharyngeal  Result Value Ref Range Status   MRSA by PCR NEGATIVE NEGATIVE Final    Comment:        The GeneXpert MRSA Assay (FDA approved for NASAL specimens only), is one component of a comprehensive MRSA colonization surveillance program. It is not intended to diagnose MRSA infection nor to guide or monitor treatment for MRSA infections. Performed at [facility], [place] [address], [place], [place] [address]      Labs: BNP (last 3 results) No results for input(s): BNP in the last 8760 hours. Basic Metabolic Panel: Recent Labs  Lab 04/22/20 0620 04/23/20 0915 04/24/20 0252 04/25/20 0340 04/26/20 0831  NA 135 135 136 134* 135  K 4.9 4.7 5.8* 5.1 4.6  CL 99 101 102 99 98  CO2 25 28 25 27 28   GLUCOSE 168* 237* 263* 203* 129*  BUN 60* 45* 42* 44* 59*  CREATININE 2.71* 2.36* 1.98* 1.99* 2.17*  CALCIUM 7.8* 7.6* 7.7* 7.9* 8.3*   Liver Function Tests: No results for input(s): AST, ALT, ALKPHOS, BILITOT, PROT, ALBUMIN in the last 168 hours. No results for input(s): LIPASE, AMYLASE in the last 168 hours. No results for input(s): AMMONIA in the last 168 hours. CBC: Recent Labs  Lab 04/21/20 0504 04/21/20 0504 04/22/20 1209 04/23/20 0915 04/23/20 1646 04/24/20 0252 04/25/20 0340  WBC 9.1  --  9.4 9.0  --  9.3 10.0  HGB 8.0*   < > 7.5* 7.3* 8.2* 8.0* 8.2*  HCT 24.6*   < > 23.2* 22.3* 25.5* 25.3* 25.7*  MCV 102.1*  --  100.9* 103.2*  --  102.8* 104.0*  PLT 183  --  205 217  --  237 268   < > = values in this interval not displayed.   Cardiac Enzymes: No results for input(s): CKTOTAL, CKMB, CKMBINDEX, TROPONINI in the last 168 hours. BNP: Invalid input(s): POCBNP CBG: Recent Labs  Lab 04/25/20 1136 04/25/20 1628 04/25/20 2053 04/26/20 0818 04/26/20 1112  GLUCAP 207* 168* 206* 109* 150*   D-Dimer No results for input(s): DDIMER in the last 72 hours. Hgb A1c No results for  input(s): HGBA1C in the last 72 hours. Lipid Profile No results for input(s): CHOL, HDL, LDLCALC, TRIG, CHOLHDL, LDLDIRECT in the last 72 hours. Thyroid function studies No results for input(s): TSH, T4TOTAL, T3FREE, THYROIDAB in the last 72 hours.  Invalid input(s): FREET3 Anemia work up No results for input(s): VITAMINB12, FOLATE, FERRITIN, TIBC, IRON, RETICCTPCT in the last 72 hours. Urinalysis    Component Value Date/Time  COLORURINE RED (A) 04/20/2020 1252   APPEARANCEUR TURBID (A) 04/20/2020 1252   LABSPEC  04/20/2020 1252    TEST NOT REPORTED DUE TO COLOR INTERFERENCE OF URINE PIGMENT   PHURINE  04/20/2020 1252    TEST NOT REPORTED DUE TO COLOR INTERFERENCE OF URINE PIGMENT   GLUCOSEU (A) 04/20/2020 1252    TEST NOT REPORTED DUE TO COLOR INTERFERENCE OF URINE PIGMENT   HGBUR (A) 04/20/2020 1252    TEST NOT REPORTED DUE TO COLOR INTERFERENCE OF URINE PIGMENT   HGBUR trace-intact 11/25/2007 0937   BILIRUBINUR (A) 04/20/2020 1252    TEST NOT REPORTED DUE TO COLOR INTERFERENCE OF URINE PIGMENT   KETONESUR (A) 04/20/2020 1252    TEST NOT REPORTED DUE TO COLOR INTERFERENCE OF URINE PIGMENT   PROTEINUR (A) 04/20/2020 1252    TEST NOT REPORTED DUE TO COLOR INTERFERENCE OF URINE PIGMENT   UROBILINOGEN 0.2 01/18/2014 0719   NITRITE (A) 04/20/2020 1252    TEST NOT REPORTED DUE TO COLOR INTERFERENCE OF URINE PIGMENT   LEUKOCYTESUR (A) 04/20/2020 1252    TEST NOT REPORTED DUE TO COLOR INTERFERENCE OF URINE PIGMENT   Sepsis Labs Invalid input(s): PROCALCITONIN,  WBC,  LACTICIDVEN Microbiology Recent Results (from the past 240 hour(s))  Resp Panel by RT PCR (RSV, Flu A&B, Covid) - Nasopharyngeal Swab     Status: None   Collection Time: 04/18/20 11:36 AM   Specimen: Nasopharyngeal Swab  Result Value Ref Range Status   SARS Coronavirus 2 by RT PCR NEGATIVE NEGATIVE Final    Comment: (NOTE) SARS-CoV-2 target nucleic acids are NOT DETECTED. The SARS-CoV-2 RNA is generally  detectable in upper respiratoy specimens during the acute phase of infection. The lowest concentration of SARS-CoV-2 viral copies this assay can detect is 131 copies/mL. A negative result does not preclude SARS-Cov-2 infection and should not be used as the sole basis for treatment or other patient management decisions. A negative result may occur with  improper specimen collection/handling, submission of specimen other than nasopharyngeal swab, presence of viral mutation(s) within the areas targeted by this assay, and inadequate number of viral copies (<131 copies/mL). A negative result must be combined with clinical observations, patient history, and epidemiological information. The expected result is Negative. Fact Sheet for Patients:  PinkCheek.be Fact Sheet for Healthcare Providers:  GravelBags.it This test is not yet ap proved or cleared by the [place] FDA and  has been authorized for detection and/or diagnosis of SARS-CoV-2 by FDA under an Emergency Use Authorization (EUA). This EUA will remain  in effect (meaning this test can be used) for the duration of the COVID-19 declaration under Section 564(b)(1) of the Act, 21 U.S.C. section 360bbb-3(b)(1), unless the authorization is terminated or revoked sooner.    Influenza A by PCR NEGATIVE NEGATIVE Final   Influenza B by PCR NEGATIVE NEGATIVE Final    Comment: (NOTE) The Xpert Xpress SARS-CoV-2/FLU/RSV assay is intended as an aid in  the diagnosis of influenza from Nasopharyngeal swab specimens and  should not be used as a sole basis for treatment. Nasal washings and  aspirates are unacceptable for Xpert Xpress SARS-CoV-2/FLU/RSV  testing. Fact Sheet for Patients: PinkCheek.be Fact Sheet for Healthcare Providers: GravelBags.it This test is not yet approved or cleared by the [place] FDA and  has been  authorized for detection and/or diagnosis of SARS-CoV-2 by  FDA under an Emergency Use Authorization (EUA). This EUA will remain  in effect (meaning this test can be used) for the duration of the  Covid-19 declaration under Section 564(b)(1) of the Act, 21  U.S.C. section 360bbb-3(b)(1), unless the authorization is  terminated or revoked.    Respiratory Syncytial Virus by PCR NEGATIVE NEGATIVE Final    Comment: (NOTE) Fact Sheet for Patients: PinkCheek.be Fact Sheet for Healthcare Providers: GravelBags.it This test is not yet approved or cleared by the [place] FDA and  has been authorized for detection and/or diagnosis of SARS-CoV-2 by  FDA under an Emergency Use Authorization (EUA). This EUA will remain  in effect (meaning this test can be used) for the duration of the  COVID-19 declaration under Section 564(b)(1) of the Act, 21 U.S.C.  section 360bbb-3(b)(1), unless the authorization is terminated or  revoked. Performed at [facility], [place] [address] [place], [place] [address]   MRSA PCR Screening     Status: None   Collection Time: 04/19/20  5:26 AM   Specimen: Nasal Mucosa; Nasopharyngeal  Result Value Ref Range Status   MRSA by PCR NEGATIVE NEGATIVE Final    Comment:        The GeneXpert MRSA Assay (FDA approved for NASAL specimens only), is one component of a comprehensive MRSA colonization surveillance program. It is not intended to diagnose MRSA infection nor to guide or monitor treatment for MRSA infections. Performed at [facility], [place] [address] [place], [place] [address]      Time coordinating discharge: Over 30 minutes  SIGNED:   Charlynne Cousins, MD  [facility] 04/26/2020, 12:50 PM Pager   If 7PM-7AM, please contact night-coverage [email or web address] Password [email or web address]

## 2020-04-26 NOTE — Plan of Care (Signed)
  Problem: Education: Goal: Verbalization of understanding the information provided (i.e., activity precautions, restrictions, etc) will improve Outcome: Progressing Goal: Individualized Educational Video(s) Outcome: Progressing   Problem: Activity: Goal: Ability to ambulate and perform ADLs will improve Outcome: Progressing   Problem: Clinical Measurements: Goal: Postoperative complications will be avoided or minimized Outcome: Progressing   Problem: Self-Concept: Goal: Ability to maintain and perform role responsibilities to the fullest extent possible will improve Outcome: Progressing   Problem: Pain Management: Goal: Pain level will decrease Outcome: Progressing   Problem: Education: Goal: Knowledge of General Education information will improve Description: Including pain rating scale, medication(s)/side effects and non-pharmacologic comfort measures Outcome: Progressing   Problem: Clinical Measurements: Goal: Ability to maintain clinical measurements within normal limits will improve Outcome: Progressing Goal: Will remain free from infection Outcome: Progressing Goal: Diagnostic test results will improve Outcome: Progressing Goal: Respiratory complications will improve Outcome: Progressing Goal: Cardiovascular complication will be avoided Outcome: Progressing   Problem: Activity: Goal: Risk for activity intolerance will decrease Outcome: Progressing   Problem: Nutrition: Goal: Adequate nutrition will be maintained Outcome: Progressing   Problem: Coping: Goal: Level of anxiety will decrease Outcome: Progressing   Problem: Elimination: Goal: Will not experience complications related to bowel motility Outcome: Progressing Goal: Will not experience complications related to urinary retention Outcome: Progressing   Problem: Pain Managment: Goal: General experience of comfort will improve Outcome: Progressing   Problem: Safety: Goal: Ability to remain free  from injury will improve Outcome: Progressing   Problem: Skin Integrity: Goal: Risk for impaired skin integrity will decrease Outcome: Progressing

## 2020-04-26 NOTE — Progress Notes (Signed)
Jamse Arn, MD  Physician  Physical Medicine and Rehabilitation  PMR Pre-admission    Signed  Date of Service:  04/24/2020 6:37 PM      Related encounter: ED to Hosp-Admission (Current) from 04/18/2020 in [place]       PMR Admission Coordinator Pre-Admission Assessment  Patient: [name] Small is an 86 y.o., male MRN: [id number] DOB: 01/08/1928 Height: 6' 2"  (188 cm) Weight: 94.9 kg  Insurance Information HMO: yes    PPO:      PCP:      IPA:      80/20:      OTHER:  PRIMARY: U[employer] Medicare      Policy#: [id number]      Subscriber: patient CM Name: Wilburn Cornelia      Phone#: [phone]     Fax#: [phone] Pre-Cert#: [id number]      Employer:  Josem Kaufmann provided by Wilburn Cornelia for admit to CIR. Pt is approved for 7 days with start date 6/16-6/22. Clinical updates due to (f): [phone] and (p): [phone] (NAVI health #: [id number]) Benefits:  Phone #: online     Name: [email or web address] Eff. Date: 11/12/19- present     Deduct: $0 (does not have deductible)      Out of Pocket Max: $3,600 ($222.50 met)  Life Max: NA CIR: $295/day co-pay for days 1-5, $0/day co-pay for days 6+      SNF: $0/day co-pay for days 1-20, $184/day co-pay for days 21-40, $0/day co-pay for days 41-100; limited to 100 days/cal yr Outpatient: $30/visit co-pay; limited by medical necessity    Home Health: 100% coverage, 0% co-insurance; limited by medical necessity      DME: 80% coverage, 20% co-insurance     Providers:  SECONDARY: None      Policy#:      Phone#:   Development worker, community:       Phone#:   The Engineer, petroleum" for patients in Inpatient Rehabilitation Facilities with attached "Privacy Act [place] Records" was provided and verbally reviewed with: Patient and Family  Emergency Contact Information         Contact Information    Name Relation Home Work Mobile   Kato,[name] Spouse [phone]  [phone]    Dale Ester Daughter [phone]        Current Medical History  Patient Admitting Diagnosis: Left proximal femur fracture s/p ORIF   History of Present Illness: Pt is an 86 yo male with history of nonobstructive CAD, HTN, hyperlipidemia, diverticulosis, non-insulin dependent DM2, COPD on room air, CKD stage IIIb, HF with preserved EF, arthritis status post left femur fracture and ORIF in 2018. Pt [facility] after a mechanical fall at home; he presented with left hip and rib pain. Imaging showed a left promixal femur fracture and pt underwent ORIF by Dr. [name] on 04/19/20. Pt had developed hematuria which was most likley traumatic from foley catheter insertion. Pt does have a history of bladder cancer and is followed by urology as an outpatient. Urology was consulted on 6/11 and recommended catheter irrigation to liberate the clots and holding Lovenox. Hematuria now resolved and foley catheter removed. Pt did have acute blood loss anemia as his hgb dropped from 13/9 to 7.5 in 4 days; pt received 1 unit PRBC with hgb stable at 8.2 after transfusion. Pt has been evaluated by therapies with recommendation for CIR. Pt is to be admitted to CIR on  04/26/20.   Patient's medical record from [facility] has been reviewed by the rehabilitation admission coordinator and physician.  Past Medical History      Past Medical History:  Diagnosis Date  . (HFpEF) heart failure with preserved ejection fraction ([place])   . Arthritis   . Back pain    s/p lumbar injection 2014  . BENIGN PROSTATIC HYPERTROPHY 05/21/2007  . Bladder cancer ([place]) 10/2011  . CAD (coronary artery disease)    nonobstructive by cath 6/12:  mid LAD 30%, proximal obtuse marginal-2 30%, proximal RCA 20%, mid RCA 30-40%.  He had normal cardiac output and mildly elevated filling pressures but no significant pulmonary hypertension;   Echocardiogram in May 2012 demonstrated EF 50-55% and left  atrial enlargement   . Cellulitis of left foot    Hospitalized in 2006  . Chronic kidney disease (CKD), stage III (moderate) 12/04/2007   FOLLOWED BY DR PATEL  . Chronic pain of lower extremity   . Complication of anesthesia    1 time bladder cancer surgery- 2012 , oxygen satruratioon dropped had to stay overnight, no problems since then.  Marland Kitchen COPD (chronic obstructive pulmonary disease) ([place])   . DIABETES MELLITUS, TYPE II 05/21/2007  . DIVERTICULOSIS, COLON 05/21/2007   pt denies  . Dyspnea    with exertion, patient mointors saturation  . Fatigue AGE-RELATED  . Gross hematuria    pt denies  . HYPERTENSION 05/21/2007  . Impaired hearing BILATERAL HEARING AIDS  . On home oxygen therapy    "2L at nighttime" (02/28/2016)  . PAD (peripheral artery disease) ([place])    a. 02/2016 L foot nonhealing ulcer-->Periph angio: L pop 100 w/ reconstitution via extensive vollats to the prox-mid peroneal (only patent vessel BK)-->PTA of L Pop and peroneal; b. Ongoing LE ischemia 5 & 06/2016 req amputation of toes on L foot.  . Pneumonia    Hx  of   . Psoriasis ELBOWS  . Psoriasis   . PSORIASIS, SCALP 10/25/2008    Family History   family history includes Diabetes in his mother; Heart disease in his brother, brother, brother, brother, brother, and brother; Lung cancer in his brother.  Prior Rehab/Hospitalizations Has the patient had prior rehab or hospitalizations prior to admission? No  Has the patient had major surgery during 100 days prior to admission? Yes             Current Medications  Current Facility-Administered Medications:  .  0.9 %  sodium chloride infusion, , Intravenous, Continuous, Charlynne Cousins, MD, Stopped at 04/24/20 1056 .  acetaminophen (TYLENOL) tablet 325-650 mg, 325-650 mg, Oral, Q6H PRN, Delray Alt, PA-C, 650 mg at 04/25/20 1828 .  amLODipine (NORVASC) tablet 2.5 mg, 2.5 mg, Oral, Daily, Patrecia Pace A, PA-C, 2.5 mg at 04/21/20 0816 .   atorvastatin (LIPITOR) tablet 10 mg, 10 mg, Oral, Daily, Delray Alt, PA-C, 10 mg at 04/25/20 0037 .  Chlorhexidine Gluconate Cloth 2 % PADS 6 each, 6 each, Topical, Daily, Delray Alt, PA-C, 6 each at 04/23/20 0816 .  escitalopram (LEXAPRO) tablet 5 mg, 5 mg, Oral, Daily, Delray Alt, PA-C, 5 mg at 04/25/20 0488 .  feeding supplement (GLUCERNA SHAKE) (GLUCERNA SHAKE) liquid 237 mL, 237 mL, Oral, BID BM, Darrick Meigs, Marge Duncans, MD, 237 mL at 04/25/20 1234 .  fluticasone furoate-vilanterol (BREO ELLIPTA) 200-25 MCG/INH 1 puff, 1 puff, Inhalation, Daily, Delray Alt, PA-C, 1 puff at 04/26/20 0845 .  HYDROcodone-acetaminophen (NORCO/VICODIN) 5-325 MG per tablet  1 tablet, 1 tablet, Oral, Q8H, Darrick Meigs, Marge Duncans, MD, 1 tablet at 04/26/20 0533 .  HYDROmorphone (DILAUDID) injection 0.5 mg, 0.5 mg, Intravenous, Q3H PRN, Charlynne Cousins, MD .  insulin aspart (novoLOG) injection 0-5 Units, 0-5 Units, Subcutaneous, QHS, Delray Alt, PA-C, 2 Units at 04/25/20 2102 .  insulin aspart (novoLOG) injection 0-9 Units, 0-9 Units, Subcutaneous, TID WC, Delray Alt, PA-C, 2 Units at 04/25/20 1655 .  insulin glargine (LANTUS) injection 15 Units, 15 Units, Subcutaneous, BID, Charlynne Cousins, MD .  melatonin tablet 5 mg, 5 mg, Oral, QHS PRN, Delray Alt, PA-C, 5 mg at 04/25/20 2202 .  methocarbamol (ROBAXIN) tablet 500 mg, 500 mg, Oral, Q6H PRN, Delray Alt, PA-C, 500 mg at 04/25/20 1828 .  multivitamin with minerals tablet 1 tablet, 1 tablet, Oral, Daily, Oswald Hillock, MD, 1 tablet at 04/25/20 7846 .  ondansetron (ZOFRAN) injection 4 mg, 4 mg, Intravenous, Q6H PRN, Blount, Xenia T, NP, 4 mg at 04/22/20 2013 .  polyethylene glycol (MIRALAX / GLYCOLAX) packet 17 g, 17 g, Oral, Daily PRN, Delray Alt, PA-C, 17 g at 04/20/20 2227 .  protein supplement (ENSURE MAX) liquid, 11 oz, Oral, Daily, Oswald Hillock, MD, 11 oz at 04/25/20 0823 .  senna (SENOKOT) tablet 8.6 mg, 1 tablet, Oral, BID, Delray Alt, PA-C, 8.6 mg at 04/25/20 2101 .  Continuous Bladder Irrigation, , , Until Discontinued **AND** sodium chloride irrigation 0.9 % 3,000 mL, 3,000 mL, Irrigation, Continuous, Dahlstedt, Stephen, MD, 3,000 mL at 04/22/20 1700 .  umeclidinium bromide (INCRUSE ELLIPTA) 62.5 MCG/INH 1 puff, 1 puff, Inhalation, Daily, Delray Alt, PA-C, 1 puff at 04/26/20 0845  Patients Current Diet:     Diet Order                  Diet Carb Modified Fluid consistency: Thin; Room service appropriate? Yes  Diet effective now                  Precautions / Restrictions Precautions Precautions: Fall Restrictions Weight Bearing Restrictions: Yes LLE Weight Bearing: Touchdown weight bearing Other Position/Activity Restrictions: Pt has difficulty maintaining weight bearing.   Has the patient had 2 or more falls or a fall with injury in the past year? Yes  Prior Activity Level Community (5-7x/wk): retired Freight forwarder; does not drive but does get out of the house 2-3x/week; does need some assist for bathing at times for safety (min A)  Prior Functional Level Self Care: Did the patient need help bathing, dressing, using the toilet or eating? Independent with some assist for bathing (min A).  Indoor Mobility: Did the patient need assistance with walking from room to room (with or without device)? Independent  Stairs: Did the patient need assistance with internal or external stairs (with or without device)? Independent  Functional Cognition: Did the patient need help planning regular tasks such as shopping or remembering to take medications? Independent  Home Assistive Devices / Equipment Home Assistive Devices/Equipment: CBG Meter, Hearing aid, Environmental consultant (specify type) (bilateral hearing aids, tripod walker) Home Equipment: Shower seat, Hand held shower head, Grab bars - tub/shower, Walker - 4 wheels, Cane - single point  Prior Device Use: Indicate devices/aids used by the patient prior  to current illness, exacerbation or injury? Cane and RW  Current Functional Level Cognition  Overall Cognitive Status: History of cognitive impairments - at baseline Difficult to assess due to: Hard of hearing/deaf Orientation Level: Oriented X4 General Comments: Pt  following most commands; hearing aid in L ear    Extremity Assessment (includes Sensation/Coordination)  Upper Extremity Assessment: Generalized weakness  Lower Extremity Assessment: Generalized weakness LLE Deficits / Details: s/p hip sx    ADLs  Overall ADL's : Needs assistance/impaired Eating/Feeding: Set up, Bed level Grooming: Set up, Wash/dry hands, Wash/dry face, Bed level Upper Body Bathing: Moderate assistance, Sitting Lower Body Bathing: Total assistance, Bed level Lower Body Bathing Details (indicate cue type and reason): Pt was unaware of BM in bed. Pt unable to assist other than rolling side to side Upper Body Dressing : Moderate assistance, Sitting Lower Body Dressing: Total assistance, Bed level Toileting- Clothing Manipulation and Hygiene: Total assistance, Bed level Toileting - Clothing Manipulation Details (indicate cue type and reason): pt with bowel incontinence Functional mobility during ADLs: Moderate assistance, +2 for physical assistance, +2 for safety/equipment General ADL Comments: Pt limited by [facility] nature, decreased strength, decreased activity tolerance and decreased ability to care for self. Pt's session focused on pericare and transferring to recliner.    Mobility  Overal bed mobility: Needs Assistance Bed Mobility: Supine to Sit, Rolling Rolling: Min assist Supine to sit: Min assist Sit to supine: +2 for physical assistance, Mod assist General bed mobility comments: ModA for rolling side to side and assist with LLE.    Transfers  Overall transfer level: Needs assistance Equipment used: Ambulation equipment used Transfer via Lift Equipment: Stedy Transfers: Sit to/from  Stand Sit to Stand: Mod assist, +2 safety/equipment, From elevated surface General transfer comment: assist for power up    Ambulation / Gait / Stairs / Wheelchair Mobility  Ambulation/Gait Ambulation/Gait assistance:  (NT) General Gait Details: Deferred    Posture / Balance Balance Overall balance assessment: Needs assistance Sitting balance-Leahy Scale: Poor Postural control: Posterior lean Standing balance-Leahy Scale: Zero    Special needs/care consideration Oxygen: 2L  Skin: abrasion to right, left arm, amputation to left toe, ecchymosis to right and left arm, skin tear to left arm; surgical incision to left leg   Diabetic management: yes   Designated visitor: wife [name]  Hard of hearing   Previous Home Environment (from acute therapy documentation) Living Arrangements: Spouse/significant other Available Help at Discharge: Family, Available 24 hours/day Type of Home: House Home Layout: One level Home Access: Stairs to enter Entrance Stairs-Rails: Left Entrance Stairs-Number of Steps: 3 Bathroom Shower/Tub: Multimedia programmer: Handicapped height Home Care Services: No  Discharge Living Setting Plans for Discharge Living Setting: Patient's home, Lives with (comment) (wife) Type of Home at Discharge: House Discharge Home Layout: One level Discharge Home Access: Stairs to enter Entrance Stairs-Rails: Left Entrance Stairs-Number of Steps: 3 Discharge Bathroom Shower/Tub: Walk-in shower Discharge Bathroom Toilet: Handicapped height Discharge Bathroom Accessibility: Yes How Accessible: Accessible via walker Does the patient have any problems obtaining your medications?: No  Social/Family/Support Systems Patient Roles: Spouse Contact Information: wife: [name]: [phone] Anticipated Caregiver: wife Anticipated Caregiver's Contact Information: wife see above Ability/Limitations of Caregiver: Min A Caregiver Availability: 24/7 Discharge Plan  Discussed with Primary Caregiver: Yes Is Caregiver In Agreement with Plan?: Yes Does Caregiver/Family have Issues with Lodging/Transportation while Pt is in Rehab?: No  Goals Patient/Family Goal for Rehab: PT/OT: Supervision/Min A; SLP: NA Expected length of stay: 10-14 days Cultural Considerations: NA Pt/Family Agrees to Admission and willing to participate: Yes Program Orientation Provided & Reviewed with Pt/Caregiver Including Roles  & Responsibilities: Yes (pt's wife)  Barriers to Discharge: Home environment access/layout  Barriers to Discharge Comments: steps to enter home.  Decrease burden of Care through IP rehab admission: NA  Possible need for SNF placement upon discharge: Pt has good support at DC from his wife; anticipate he can reach a Min A level to return home with his wife.   Patient Condition: I have reviewed medical records from [facility], spoken with MD, RN, and patient and spouse. I met with patient at the bedside for inpatient rehabilitation assessment.  Patient will benefit from ongoing PT and OT, can actively participate in 3 hours of therapy a day 5 days of the week, and can make measurable gains during the admission.  Patient will also benefit from the coordinated team approach during an Inpatient Acute Rehabilitation admission.  The patient will receive intensive therapy as well as Rehabilitation physician, nursing, social worker, and care management interventions.  Due to safety, skin/wound care, disease management, medication administration, pain management and patient education the patient requires 24 hour a day rehabilitation nursing.  The patient is currently Mod A for transfers and up to total A for basic ADLs.  Discharge setting and therapy post discharge at home with home health is anticipated.  Patient has agreed to participate in the Acute Inpatient Rehabilitation Program and will admit 04/26/20.  Preadmission Screen Completed By:  Raechel Ache, 04/26/2020 10:33 AM ______________________________________________________________________   Discussed status with Dr. Posey Pronto  on 04/26/20 at 10:33AM and received approval for admission today.  Admission Coordinator:  Raechel Ache, OT, time 10:33AM/Date 04/26/20   Assessment/Plan: Diagnosis: Left proximal femur fracture s/p ORIF  1. Does the need for close, 24 hr/day Medical supervision in concert with the patient's rehab needs make it unreasonable for this patient to be served in a less intensive setting? Yes 2. Co-Morbidities requiring supervision/potential complications: nonobstructive CAD, HTN, hyperlipidemia, diverticulosis, non-insulin dependent DM2, COPD on room air, CKD stage IIIb, HF with preserved EF, arthritis  3. Due to bowel management, safety, skin/wound care, disease management, pain management and patient education, does the patient require 24 hr/day rehab nursing? Yes 4. Does the patient require coordinated care of a physician, rehab nurse, PT, OT to address physical and functional deficits in the context of the above medical diagnosis(es)? Yes Addressing deficits in the following areas: balance, endurance, locomotion, strength, transferring, bathing, dressing, toileting and psychosocial support 5. Can the patient actively participate in an intensive therapy program of at least 3 hrs of therapy 5 days a week? Yes 6. The potential for patient to make measurable gains while on inpatient rehab is excellent 7. Anticipated functional outcomes upon discharge from inpatient rehab: supervision and min assist PT, supervision and min assist OT, n/a SLP 8. Estimated rehab length of stay to reach the above functional goals is: 10-14 days. 9. Anticipated discharge destination: Home 10. Overall Rehab/Functional Prognosis: good   MD Signature: Delice Lesch, MD, ABPMR        Revision History                          Note Details  Author Jamse Arn, MD File Time  04/26/2020 10:39 AM  Author Type Physician Status Signed  Last Editor Jamse Arn, MD Service Physical Medicine and Rehabilitation

## 2020-04-26 NOTE — Progress Notes (Signed)
[facility] PROGRESS NOTE    Progress Note  [name] Small  WUJ:[id number] DOB: 04-18-1928 DOA: 04/18/2020 PCP: Tonia Ghent, MD     Brief Narrative:   [name] Small is an 86 y.o. male past medical history of nonobstructive CAD, essential hypertension diverticulosis non-insulin-dependent diabetes mellitus type 2 COPD on room air chronic kidney disease stage IIIb heart failure diastolic status post 0R IFN 2018 comes into the ED after mechanical fall at home fell onto her left side and developed left hip and rib pain.  Imaging in the ED showed periprostatic fracture orthopedic surgery was consulted  Assessment/Plan:   Mechanical fall leading to left proximal femur fracture: Status post 0RIF of the left femur, pain is now well controlled. Physical therapy evaluated the patient recommended inpatient rehab, awaiting inpatient rehab evaluation.  Left arm numbness likely due from neuropathy: CT of the head negative for stroke.  Patient relates this is chronic in both upper extremities.  Traumatic hematuria: Likely traumatic from Foley catheter, and was low she is status post 1 unit of packed red blood cells.  Lovenox was discontinued urology was consulted patient was started on bladder irrigation.  Hematuria has now resolved. Foley catheter has been discontinued.  Acute blood loss anemia: His hemoglobin dropped from 13.9-7.5 in 4 days she is status post 1 unit of packed red blood cells his hemoglobin today is 0.2.  Essential hypertension: Continue current home medication except for her furosemide which is on hold due to poor oral intake.  Acute kidney injury on chronic kidney disease stage IIIb: With a baseline creatinine of 1.6-1.9 resolved with IV fluid hydration likely prerenal azotemia in the setting of antihypertensive medication.  Hyperlipidemia: Continue statins.  Insulin-dependent diabetes mellitus type 2: Continue long-acting insulin plus sliding scale, hold oral  hypoglycemic medications. Blood glucose has been ranging more than 100 increase long-acting insulin. Oral intake has been poor only eating less than 50% of his food.  COPD: Not oxygen dependent continue inhalers.  Hyperkalemia: Admission 5.8 was given 1 dose of Lokelma and is slowly coming down, basic metabolic panel is pending this morning.  DVT prophylaxis: per orthopedic surgery recommended aspirin 325 but due to hematuria was stopped now on SCDs.  Ortho getting a lower extremity Dopplers.   RN Pressure Injury Documentation:    Estimated body mass index is 26.86 kg/m as calculated from the following:   Height as of this encounter: 6\' 2"  (1.88 m).   Weight as of this encounter: 94.9 kg. Malnutrition Type:  Nutrition Problem: Increased nutrient needs Etiology: post-op healing   Malnutrition Characteristics:  Signs/Symptoms: estimated needs   Nutrition Interventions:  Interventions: MVI, Glucerna shake, Premier Protein    DVT prophylaxis: SCD's Family Communication:Daughter Status is: Inpatient  Remains inpatient appropriate because:Ongoing active pain requiring inpatient pain management   Dispo: The patient is from: Home              Anticipated d/c is to: CIR              Anticipated d/c date is: 1 day              Patient currently is not medically stable to d/c.        Code Status:     Code Status Orders  (From admission, onward)         Start     Ordered   04/18/20 1233  Full code  Continuous        04/18/20 1237  Code Status History    Date Active Date Inactive Code Status Order ID Comments User Context   12/26/2016 1408 12/31/2016 1920 Full Code [id number]  Jessy Oto, MD Inpatient   10/18/2016 1749 10/19/2016 1736 Full Code [id number]  Jessy Oto, MD Inpatient   06/10/2016 1304 06/14/2016 1643 Full Code [id number]  Radene Gunning, NP ED   01/18/2014 0146 01/21/2014 1508 DNR [id number]  Toy Baker, MD Inpatient   Advance  Care Planning Activity        IV Access:    Peripheral IV   Procedures and diagnostic studies:   CT HEAD WO CONTRAST  Result Date: 04/24/2020 CLINICAL DATA:  Status post fall.  Neuro deficits. EXAM: CT HEAD WITHOUT CONTRAST TECHNIQUE: Contiguous axial images were obtained from the base of the skull through the vertex without intravenous contrast. COMPARISON:  None. FINDINGS: Brain: No evidence of acute infarction, hemorrhage, hydrocephalus, extra-axial collection or mass lesion/mass effect. There is mild diffuse low-attenuation within the subcortical and periventricular white matter compatible with chronic microvascular disease. Generalized brain atrophy. Vascular: No hyperdense vessel or unexpected calcification. Skull: Normal. Negative for fracture or focal lesion. Sinuses/Orbits: No acute finding. Other: None IMPRESSION: 1. No acute intracranial abnormalities. 2. Chronic small vessel ischemic change and brain atrophy. Electronically Signed   By: Kerby Moors M.D.   On: 04/24/2020 20:46     Medical Consultants:    None.  Anti-Infectives:   none  Subjective:    [name] Small he relates his pain is controlled has not had a bowel movement in 2 days.  Objective:    Vitals:   04/25/20 0809 04/25/20 1408 04/25/20 1947 04/26/20 0300  BP:  123/68 127/71 134/77  Pulse:  89 71 69  Resp:  16 18 16   Temp:  98.2 F (36.8 C) 98.3 F (36.8 C) 98.3 F (36.8 C)  TempSrc:  Oral Oral Oral  SpO2: 94% 96% 97% 98%  Weight:      Height:       SpO2: 98 % O2 Flow Rate (L/min): 2 L/min FiO2 (%): 28 %   Intake/Output Summary (Last 24 hours) at 04/26/2020 0815 Last data filed at 04/26/2020 0300 Gross per 24 hour  Intake 480 ml  Output 550 ml  Net -70 ml   Filed Weights   04/19/20 0816  Weight: 94.9 kg    Exam: General exam: In no acute distress. Respiratory system: Good air movement and clear to auscultation. Cardiovascular system: S1 & S2 heard, RRR.  Gastrointestinal  system: Abdomen is nondistended, soft and nontender.  Extremities: No pedal edema. Skin: No rashes, lesions or ulcers Psychiatry: Judgement and insight appear normal.    Data Reviewed:    Labs: Basic Metabolic Panel: Recent Labs  Lab 04/22/20 0620 04/22/20 0620 04/23/20 0915 04/23/20 0915 04/24/20 0252 04/25/20 0340  NA 135  --  135  --  136 134*  K 4.9   < > 4.7   < > 5.8* 5.1  CL 99  --  101  --  102 99  CO2 25  --  28  --  25 27  GLUCOSE 168*  --  237*  --  263* 203*  BUN 60*  --  45*  --  42* 44*  CREATININE 2.71*  --  2.36*  --  1.98* 1.99*  CALCIUM 7.8*  --  7.6*  --  7.7* 7.9*   < > = values in this interval not displayed.   GFR Estimated Creatinine Clearance: 29.3 mL/min (  A) (by C-G formula based on SCr of 1.99 mg/dL (H)). Liver Function Tests: No results for input(s): AST, ALT, ALKPHOS, BILITOT, PROT, ALBUMIN in the last 168 hours. No results for input(s): LIPASE, AMYLASE in the last 168 hours. No results for input(s): AMMONIA in the last 168 hours. Coagulation profile No results for input(s): INR, PROTIME in the last 168 hours. COVID-19 Labs  No results for input(s): DDIMER, FERRITIN, LDH, CRP in the last 72 hours.  Lab Results  Component Value Date   [place] NEGATIVE 04/18/2020    CBC: Recent Labs  Lab 04/21/20 0504 04/21/20 0504 04/22/20 1209 04/23/20 0915 04/23/20 1646 04/24/20 0252 04/25/20 0340  WBC 9.1  --  9.4 9.0  --  9.3 10.0  HGB 8.0*   < > 7.5* 7.3* 8.2* 8.0* 8.2*  HCT 24.6*   < > 23.2* 22.3* 25.5* 25.3* 25.7*  MCV 102.1*  --  100.9* 103.2*  --  102.8* 104.0*  PLT 183  --  205 217  --  237 268   < > = values in this interval not displayed.   Cardiac Enzymes: No results for input(s): CKTOTAL, CKMB, CKMBINDEX, TROPONINI in the last 168 hours. BNP (last 3 results) No results for input(s): PROBNP in the last 8760 hours. CBG: Recent Labs  Lab 04/24/20 2045 04/25/20 0744 04/25/20 1136 04/25/20 1628 04/25/20 2053  GLUCAP  257* 174* 207* 168* 206*   D-Dimer: No results for input(s): DDIMER in the last 72 hours. Hgb A1c: No results for input(s): HGBA1C in the last 72 hours. Lipid Profile: No results for input(s): CHOL, HDL, LDLCALC, TRIG, CHOLHDL, LDLDIRECT in the last 72 hours. Thyroid function studies: No results for input(s): TSH, T4TOTAL, T3FREE, THYROIDAB in the last 72 hours.  Invalid input(s): FREET3 Anemia work up: No results for input(s): VITAMINB12, FOLATE, FERRITIN, TIBC, IRON, RETICCTPCT in the last 72 hours. Sepsis Labs: Recent Labs  Lab 04/22/20 1209 04/23/20 0915 04/24/20 0252 04/25/20 0340  WBC 9.4 9.0 9.3 10.0   Microbiology Recent Results (from the past 240 hour(s))  Resp Panel by RT PCR (RSV, Flu A&B, Covid) - Nasopharyngeal Swab     Status: None   Collection Time: 04/18/20 11:36 AM   Specimen: Nasopharyngeal Swab  Result Value Ref Range Status   SARS Coronavirus 2 by RT PCR NEGATIVE NEGATIVE Final    Comment: (NOTE) SARS-CoV-2 target nucleic acids are NOT DETECTED. The SARS-CoV-2 RNA is generally detectable in upper respiratoy specimens during the acute phase of infection. The lowest concentration of SARS-CoV-2 viral copies this assay can detect is 131 copies/mL. A negative result does not preclude SARS-Cov-2 infection and should not be used as the sole basis for treatment or other patient management decisions. A negative result may occur with  improper specimen collection/handling, submission of specimen other than nasopharyngeal swab, presence of viral mutation(s) within the areas targeted by this assay, and inadequate number of viral copies (<131 copies/mL). A negative result must be combined with clinical observations, patient history, and epidemiological information. The expected result is Negative. Fact Sheet for Patients:  PinkCheek.be Fact Sheet for Healthcare Providers:  GravelBags.it This test is not  yet ap proved or cleared by the [place] FDA and  has been authorized for detection and/or diagnosis of SARS-CoV-2 by FDA under an Emergency Use Authorization (EUA). This EUA will remain  in effect (meaning this test can be used) for the duration of the COVID-19 declaration under Section 564(b)(1) of the Act, 21 U.S.C. section 360bbb-3(b)(1), unless the authorization  is terminated or revoked sooner.    Influenza A by PCR NEGATIVE NEGATIVE Final   Influenza B by PCR NEGATIVE NEGATIVE Final    Comment: (NOTE) The Xpert Xpress SARS-CoV-2/FLU/RSV assay is intended as an aid in  the diagnosis of influenza from Nasopharyngeal swab specimens and  should not be used as a sole basis for treatment. Nasal washings and  aspirates are unacceptable for Xpert Xpress SARS-CoV-2/FLU/RSV  testing. Fact Sheet for Patients: PinkCheek.be Fact Sheet for Healthcare Providers: GravelBags.it This test is not yet approved or cleared by the [place] FDA and  has been authorized for detection and/or diagnosis of SARS-CoV-2 by  FDA under an Emergency Use Authorization (EUA). This EUA will remain  in effect (meaning this test can be used) for the duration of the  Covid-19 declaration under Section 564(b)(1) of the Act, 21  U.S.C. section 360bbb-3(b)(1), unless the authorization is  terminated or revoked.    Respiratory Syncytial Virus by PCR NEGATIVE NEGATIVE Final    Comment: (NOTE) Fact Sheet for Patients: PinkCheek.be Fact Sheet for Healthcare Providers: GravelBags.it This test is not yet approved or cleared by the [place] FDA and  has been authorized for detection and/or diagnosis of SARS-CoV-2 by  FDA under an Emergency Use Authorization (EUA). This EUA will remain  in effect (meaning this test can be used) for the duration of the  COVID-19 declaration under Section 564(b)(1)  of the Act, 21 U.S.C.  section 360bbb-3(b)(1), unless the authorization is terminated or  revoked. Performed at [facility], [place] [address] [place], [place] [address]   MRSA PCR Screening     Status: None   Collection Time: 04/19/20  5:26 AM   Specimen: Nasal Mucosa; Nasopharyngeal  Result Value Ref Range Status   MRSA by PCR NEGATIVE NEGATIVE Final    Comment:        The GeneXpert MRSA Assay (FDA approved for NASAL specimens only), is one component of a comprehensive MRSA colonization surveillance program. It is not intended to diagnose MRSA infection nor to guide or monitor treatment for MRSA infections. Performed at [facility], [place] [address] [place], [place] [address]      Medications:   . amLODipine  2.5 mg Oral Daily  . atorvastatin  10 mg Oral Daily  . Chlorhexidine Gluconate Cloth  6 each Topical Daily  . escitalopram  5 mg Oral Daily  . feeding supplement (GLUCERNA SHAKE)  237 mL Oral BID BM  . fluticasone furoate-vilanterol  1 puff Inhalation Daily  . HYDROcodone-acetaminophen  1 tablet Oral Q8H  . insulin aspart  0-5 Units Subcutaneous QHS  . insulin aspart  0-9 Units Subcutaneous TID WC  . insulin glargine  20 Units Subcutaneous QHS  . multivitamin with minerals  1 tablet Oral Daily  . Ensure Max Protein  11 oz Oral Daily  . senna  1 tablet Oral BID  . umeclidinium bromide  1 puff Inhalation Daily   Continuous Infusions: . sodium chloride Stopped (04/24/20 1056)  . sodium chloride irrigation        LOS: 8 days   Charlynne Cousins  [facility]  04/26/2020, 8:15 AM

## 2020-04-26 NOTE — Progress Notes (Signed)
Physical Therapy Treatment Patient Details Name: [name] Small MRN: [id number] DOB: February 23, 1928 Today's Date: 04/26/2020    History of Present Illness Pt is an 86 year old man admitted with L hip fx on 04/18/20 after falling. Underwent ORIF on 04/19/20. PMH: COPD (per wife not 02 dependent), HTN, CAD, PVD, L transmet amputation, DM, afib, CHF, CKD, renal disease and anxiety.    PT Comments    Pt supine in bed on arrival.  He appears drowsy and required cues to remain engaged in session.  He performed sit to standf mod +2 but remains to have difficulty maintaining weight bearing restriction.  Pt required squat pivot from bed to recliner.  Pt to d/c to CIR this pm.     Follow Up Recommendations  CIR;Supervision/Assistance - 24 hour     Equipment Recommendations  None recommended by PT    Recommendations for Other Services Rehab consult     Precautions / Restrictions Precautions Precautions: Fall Restrictions Weight Bearing Restrictions: Yes LLE Weight Bearing: Touchdown weight bearing Other Position/Activity Restrictions: Pt has difficulty maintaining weight bearing.    Mobility  Bed Mobility Overal bed mobility: Needs Assistance Bed Mobility: Supine to Sit     Supine to sit: Mod assist     General bed mobility comments: Mod assistance to move from supine to sit with assist for LE advancement and trunk elevation, utilized PTA as a railing.  Transfers Overall transfer level: Needs assistance Equipment used: Rolling walker (2 wheeled);None Transfers: Sit to/from Stand Sit to Stand: Mod assist;+2 physical assistance   Squat pivot transfers: Max assist;+2 physical assistance     General transfer comment: Performed sit to stand to RW from edge of elevated bed with mod +2 assistance.  He remains unable to maintain weight bearing in standing so returned to seated position.  From there PTA utilized squat pivot to move from bed to recliner with no device and max +2  assistance.  Ambulation/Gait Ambulation/Gait assistance:  (NT unable to maintain weight bearing.)               Stairs             Wheelchair Mobility    Modified Rankin (Stroke Patients Only)       Balance Overall balance assessment: Needs assistance   Sitting balance-Leahy Scale: Poor   Postural control: Posterior lean   Standing balance-Leahy Scale: Zero                              Cognition Arousal/Alertness: Awake/alert Behavior During Therapy: WFL for tasks assessed/performed Overall Cognitive Status: History of cognitive impairments - at baseline                                 General Comments: Pt following most commands; hearing aid in L ear      Exercises General Exercises - Lower Extremity Ankle Circles/Pumps: AROM;Both;20 reps;Supine Quad Sets: AROM;Both;10 reps;Supine Heel Slides: AAROM;Left;10 reps;Supine Hip ABduction/ADduction: AAROM;Left;10 reps;Supine    General Comments        Pertinent Vitals/Pain Pain Assessment: Faces Pain Score: 5  Pain Location: L hip Pain Descriptors / Indicators: Grimacing;Guarding Pain Intervention(s): Repositioned    Home Living                      Prior Function  PT Goals (current goals can now be found in the care plan section) Acute Rehab PT Goals Patient Stated Goal: to get stronger and return home Potential to Achieve Goals: Good Progress towards PT goals: Progressing toward goals    Frequency    Min 3X/week      PT Plan Current plan remains appropriate    Co-evaluation              AM-PAC PT "6 Clicks" Mobility   Outcome Measure  Help needed turning from your back to your side while in a flat bed without using bedrails?: A Little Help needed moving from lying on your back to sitting on the side of a flat bed without using bedrails?: A Lot Help needed moving to and from a bed to a chair (including a wheelchair)?: A  Lot Help needed standing up from a chair using your arms (e.g., wheelchair or bedside chair)?: A Lot Help needed to walk in hospital room?: Total Help needed climbing 3-5 steps with a railing? : Total 6 Click Score: 11    End of Session Equipment Utilized During Treatment: Gait belt Activity Tolerance: Patient limited by pain Patient left: with call bell/phone within reach;with family/visitor present;with chair alarm set;in chair Nurse Communication: Mobility status;Need for lift equipment PT Visit Diagnosis: Unsteadiness on feet (R26.81);Pain;Difficulty in walking, not elsewhere classified (R26.2) Pain - Right/Left: Left Pain - part of body: Hip     Time: 8144-8185 PT Time Calculation (min) (ACUTE ONLY): 22 min  Charges:  $Therapeutic Activity: 8-22 mins                     Erasmo Leventhal , PTA Acute Rehabilitation Services Pager [phone] Office [phone]     Deveion Denz Eli Hose 04/26/2020, 5:12 PM

## 2020-04-26 NOTE — Progress Notes (Signed)
Pt admitted to IR unit, report given by 5W nursing.. Pt was transferred with [place] 2L. Pt is resting comfortably in bed. All questions were answered and pt and family were instructed on IR policies and procedures. Pt states pain as "0". Bed in low position, call bell  Within reach and bed alarm on. Doy Hutching, LPN

## 2020-04-26 NOTE — Progress Notes (Signed)
Inpatient Rehabilitation-Admissions Coordinator   I have received insurance approval and medical clearance from Dr. Olevia Bowens for admit to CIR today. Pt and his wife updated on bed offer and have accepted. Reviewed insurance benefits letter and consent forms. RN and [facility] team aware of plan for admit today.   Please call if questions.   Raechel Ache, OTR/L  Rehab Admissions Coordinator  [phone] 04/26/2020 10:29 AM

## 2020-04-26 NOTE — Progress Notes (Signed)
Report given to CIR, all personal belongings gathered by family and taken at time of transfer.

## 2020-04-27 ENCOUNTER — Inpatient Hospital Stay (HOSPITAL_COMMUNITY): Payer: Medicare Other

## 2020-04-27 ENCOUNTER — Encounter (HOSPITAL_COMMUNITY): Payer: Self-pay | Admitting: Physical Medicine and Rehabilitation

## 2020-04-27 ENCOUNTER — Inpatient Hospital Stay (HOSPITAL_COMMUNITY): Payer: Medicare Other | Admitting: Occupational Therapy

## 2020-04-27 ENCOUNTER — Other Ambulatory Visit: Payer: Self-pay

## 2020-04-27 DIAGNOSIS — M978XXS Periprosthetic fracture around other internal prosthetic joint, sequela: Secondary | ICD-10-CM

## 2020-04-27 DIAGNOSIS — Z96649 Presence of unspecified artificial hip joint: Secondary | ICD-10-CM

## 2020-04-27 LAB — COMPREHENSIVE METABOLIC PANEL
ALT: 7 U/L (ref 0–44)
AST: 16 U/L (ref 15–41)
Albumin: 2.2 g/dL — ABNORMAL LOW (ref 3.5–5.0)
Alkaline Phosphatase: 38 U/L (ref 38–126)
Anion gap: 10 (ref 5–15)
BUN: 71 mg/dL — ABNORMAL HIGH (ref 8–23)
CO2: 27 mmol/L (ref 22–32)
Calcium: 8.1 mg/dL — ABNORMAL LOW (ref 8.9–10.3)
Chloride: 98 mmol/L (ref 98–111)
Creatinine, Ser: 2.63 mg/dL — ABNORMAL HIGH (ref 0.61–1.24)
GFR calc Af Amer: 24 mL/min — ABNORMAL LOW (ref >60)
GFR calc non Af Amer: 21 mL/min — ABNORMAL LOW (ref >60)
Glucose, Bld: 106 mg/dL — ABNORMAL HIGH (ref 70–99)
Potassium: 4.8 mmol/L (ref 3.5–5.1)
Sodium: 135 mmol/L (ref 135–145)
Total Bilirubin: 0.9 mg/dL (ref 0.3–1.2)
Total Protein: 5.1 g/dL — ABNORMAL LOW (ref 6.5–8.1)

## 2020-04-27 LAB — CBC WITH DIFFERENTIAL/PLATELET
Abs Immature Granulocytes: 0.47 10*3/uL — ABNORMAL HIGH (ref 0.00–0.07)
Basophils Absolute: 0 10*3/uL (ref 0.0–0.1)
Basophils Relative: 0 %
Eosinophils Absolute: 0.3 10*3/uL (ref 0.0–0.5)
Eosinophils Relative: 3 %
HCT: 22.4 % — ABNORMAL LOW (ref 39.0–52.0)
Hemoglobin: 7.3 g/dL — ABNORMAL LOW (ref 13.0–17.0)
Immature Granulocytes: 4 %
Lymphocytes Relative: 12 %
Lymphs Abs: 1.3 10*3/uL (ref 0.7–4.0)
MCH: 32.9 pg (ref 26.0–34.0)
MCHC: 32.6 g/dL (ref 30.0–36.0)
MCV: 100.9 fL — ABNORMAL HIGH (ref 80.0–100.0)
Monocytes Absolute: 1.4 10*3/uL — ABNORMAL HIGH (ref 0.1–1.0)
Monocytes Relative: 13 %
Neutro Abs: 7.3 10*3/uL (ref 1.7–7.7)
Neutrophils Relative %: 68 %
Platelets: 298 10*3/uL (ref 150–400)
RBC: 2.22 MIL/uL — ABNORMAL LOW (ref 4.22–5.81)
RDW: 13 % (ref 11.5–15.5)
WBC: 10.8 10*3/uL — ABNORMAL HIGH (ref 4.0–10.5)
nRBC: 0 % (ref 0.0–0.2)

## 2020-04-27 LAB — URINALYSIS, ROUTINE W REFLEX MICROSCOPIC
Bilirubin Urine: NEGATIVE
Glucose, UA: NEGATIVE mg/dL
Ketones, ur: NEGATIVE mg/dL
Nitrite: NEGATIVE
Protein, ur: NEGATIVE mg/dL
Specific Gravity, Urine: 1.015 (ref 1.005–1.030)
pH: 5 (ref 5.0–8.0)

## 2020-04-27 LAB — URINALYSIS, MICROSCOPIC (REFLEX): WBC, UA: NONE SEEN WBC/hpf (ref 0–5)

## 2020-04-27 LAB — GLUCOSE, CAPILLARY
Glucose-Capillary: 99 mg/dL (ref 70–99)
Glucose-Capillary: 179 mg/dL — ABNORMAL HIGH (ref 70–99)
Glucose-Capillary: 113 mg/dL — ABNORMAL HIGH (ref 70–99)
Glucose-Capillary: 101 mg/dL — ABNORMAL HIGH (ref 70–99)

## 2020-04-27 MED ORDER — DARBEPOETIN ALFA 150 MCG/0.3ML IJ SOSY
150.0000 ug | PREFILLED_SYRINGE | INTRAMUSCULAR | Status: DC
  Administered 2020-04-28 – 2020-05-05 (×2): 150 ug via SUBCUTANEOUS
  Filled 2020-04-27 (×2): qty 0.3

## 2020-04-27 MED ORDER — CALCIUM CARBONATE 1250 (500 CA) MG PO TABS
1.0000 | ORAL_TABLET | Freq: Every day | ORAL | Status: DC
  Administered 2020-04-27 – 2020-05-12 (×16): 500 mg via ORAL
  Filled 2020-04-27 (×16): qty 1

## 2020-04-27 MED ORDER — SENNOSIDES-DOCUSATE SODIUM 8.6-50 MG PO TABS
1.0000 | ORAL_TABLET | Freq: Two times a day (BID) | ORAL | Status: DC
  Administered 2020-04-27 – 2020-04-30 (×7): 1 via ORAL
  Filled 2020-04-27 (×6): qty 1

## 2020-04-27 MED ORDER — SODIUM CHLORIDE 0.9 % IV BOLUS
250.0000 mL | Freq: Once | INTRAVENOUS | Status: AC
  Administered 2020-04-27: 250 mL via INTRAVENOUS

## 2020-04-27 MED ORDER — HYDROCODONE-ACETAMINOPHEN 5-325 MG PO TABS
1.0000 | ORAL_TABLET | Freq: Four times a day (QID) | ORAL | Status: DC | PRN
  Administered 2020-04-27 – 2020-05-11 (×35): 1 via ORAL
  Filled 2020-04-27 (×39): qty 1

## 2020-04-27 MED ORDER — TAMSULOSIN HCL 0.4 MG PO CAPS
0.4000 mg | ORAL_CAPSULE | Freq: Every day | ORAL | Status: DC
  Administered 2020-04-27 – 2020-05-01 (×5): 0.4 mg via ORAL
  Filled 2020-04-27 (×5): qty 1

## 2020-04-27 NOTE — Progress Notes (Signed)
  Inpatient Rehabilitation Medication Review by a Pharmacist  A complete drug regimen review was completed for this patient to identify any potential clinically significant medication issues.  Clinically significant medication issues were identified:  no   Name of provider notified for urgent issues identified:  N/A (no issues noted) Time spent performing this drug regimen review (minutes):  8 minutes   Nicole Cella, RPh Clinical Pharmacist 04/27/2020 9:14 AM

## 2020-04-27 NOTE — Plan of Care (Signed)
LTGs established °

## 2020-04-27 NOTE — Evaluation (Signed)
Physical Therapy Assessment and Plan  Patient Details  Name: [name] Small MRN: [id number] Date of Birth: [birth year]  PT Diagnosis: Difficulty walking, Impaired sensation and Muscle weakness Rehab Potential: Good ELOS: 14-16 days   Today's Date: 04/27/2020 PT Individual Time: 7829-5621 and 3086-5784 PT Individual Time Calculation (min): 68 min and 57 min  Problem List:  Patient Active Problem List   Diagnosis Date Noted  . Femur fracture, left ([place]) 04/26/2020  . Periprosthetic hip fracture 04/26/2020  . Acute kidney injury ([place])   . Closed left subtrochanteric femur fracture, initial encounter ([place])   . Acute blood loss anemia   . Gross hematuria   . Controlled type 2 diabetes mellitus with hyperglycemia, with long-term current use of insulin ([place])   . Benign essential HTN   . Post-operative pain   . Closed left hip fracture, initial encounter ([place]) 04/18/2020  . Anxiety 12/15/2019  . Laceration of right thumb without foreign body with damage to nail 12/15/2019  . Anemia 01/31/2017  . Hypoxia   . History of hip replacement 12/26/2016    Class: Chronic  . Status post lumbar laminectomy 12/26/2016    Class: Chronic  . S/p left hip fracture 12/26/2016  . Idiopathic chronic venous hypertension of left lower extremity with inflammation 11/07/2016  . Spinal stenosis of lumbar region with neurogenic claudication 10/18/2016  . Spinal stenosis of lumbar region 09/25/2016  . Fall 07/04/2016  . COPD (chronic obstructive pulmonary disease) ([place]) 06/10/2016  . Weakness 03/18/2016  . Critical lower limb ischemia 02/28/2016  . Peripheral arterial disease ([place]) 10/17/2015  . Edema 07/19/2015  . Claudication ([place]) 01/31/2015  . DOE (dyspnea on exertion) 01/31/2015  . Hyperlipidemia 01/31/2015  . HTN (hypertension) 01/31/2015  . Chronic diastolic CHF (congestive heart failure) ([place]) 01/21/2014  . Atrial fibrillation ([place]) 12/16/2012  . Hyperkalemia 11/27/2011  . Bladder cancer  ([place]) 09/22/2011  . Leg cramps 08/28/2011  . Cough 05/26/2011  . CAD (coronary artery disease) of artery bypass graft 05/09/2011  . Dyspnea on exertion 04/19/2011  . PSORIASIS, SCALP 10/25/2008  . Chronic kidney disease, stage III (moderate) 12/04/2007  . Type 2 diabetes mellitus with diabetic polyneuropathy ([place]) 05/21/2007  . DIVERTICULOSIS, COLON 05/21/2007  . BPH (benign prostatic hyperplasia) 05/21/2007    Past Medical History:  Past Medical History:  Diagnosis Date  . (HFpEF) heart failure with preserved ejection fraction ([place])   . Arthritis   . Back pain    s/p lumbar injection 2014  . BENIGN PROSTATIC HYPERTROPHY 05/21/2007  . Bladder cancer ([place]) 10/2011  . CAD (coronary artery disease)    nonobstructive by cath 6/12:  mid LAD 30%, proximal obtuse marginal-2 30%, proximal RCA 20%, mid RCA 30-40%.  He had normal cardiac output and mildly elevated filling pressures but no significant pulmonary hypertension;   Echocardiogram in May 2012 demonstrated EF 50-55% and left atrial enlargement   . Cellulitis of left foot    Hospitalized in 2006  . Chronic kidney disease (CKD), stage III (moderate) 12/04/2007   FOLLOWED BY DR PATEL  . Chronic pain of lower extremity   . Complication of anesthesia    1 time bladder cancer surgery- 2012 , oxygen satruratioon dropped had to stay overnight, no problems since then.  Marland Kitchen COPD (chronic obstructive pulmonary disease) ([place])   . DIABETES MELLITUS, TYPE II 05/21/2007  . DIVERTICULOSIS, COLON 05/21/2007   pt denies  . Dyspnea    with exertion, patient mointors saturation  . Fatigue AGE-RELATED  . Gross hematuria  pt denies  . HYPERTENSION 05/21/2007  . Impaired hearing BILATERAL HEARING AIDS  . On home oxygen therapy    as needed  . PAD (peripheral artery disease) ([place])    a. 02/2016 L foot nonhealing ulcer-->Periph angio: L pop 100 w/ reconstitution via extensive vollats to the prox-mid peroneal (only patent vessel BK)-->PTA of L Pop and  peroneal; b. Ongoing LE ischemia 5 & 06/2016 req amputation of toes on L foot.  . Pneumonia    Hx  of   . Psoriasis ELBOWS  . Psoriasis   . PSORIASIS, SCALP 10/25/2008   Past Surgical History:  Past Surgical History:  Procedure Laterality Date  . AMPUTATION Left 03/27/2016   Procedure: partial first AMPUTATION RAY; LEFT;  Surgeon: Trula Slade, DPM;  Location: [facility];  Service: Podiatry;  Laterality: Left;  . AMPUTATION Left 06/12/2016   Procedure: TRANSMETATARSAL AMPUTATION LEFT FOOT;  Surgeon: Newt Minion, MD;  Location: [facility];  Service: Orthopedics;  Laterality: Left;  . APPENDECTOMY  1941  . CARDIAC CATHETERIZATION  04-24-11/  DR Rogue Jury ARIDA   MILD NONOBSTRUCTIVE CAD, NORMA CARDIAC OUTPUT  . CARDIOVASCULAR STRESS TEST  2007  . CATARACT EXTRACTION W/ INTRAOCULAR LENS  IMPLANT, BILATERAL Bilateral ~ 2010  . CYSTOSCOPY  12/25/2011   Procedure: CYSTOSCOPY;  Surgeon: Molli Hazard, MD;  Location: [facility];  Service: Urology;  Laterality: N/A;  needs intubation and to be paralyzed   . CYSTOSCOPY W/ RETROGRADES  10/30/2011   Procedure: CYSTOSCOPY WITH RETROGRADE PYELOGRAM;  Surgeon: Molli Hazard, MD;  Location: [facility];  Service: Urology;  Laterality: Bilateral;  CYSTOSCOPY POSS TURBT BILATERAL RETROGRADE PYLEOGRAM   . CYSTOSCOPY W/ RETROGRADES  11/30/2012   Procedure: CYSTOSCOPY WITH RETROGRADE PYELOGRAM;  Surgeon: Molli Hazard, MD;  Location: [facility];  Service: Urology;  Laterality: Bilateral;  Flexible cystoscopy.  . CYSTOSCOPY WITH BIOPSY  11/30/2012   Procedure: CYSTOSCOPY WITH BIOPSY;  Surgeon: Molli Hazard, MD;  Location: [facility];  Service: Urology;  Laterality: N/A;  . INCISION AND DRAINAGE FOOT Left ~ 2005   LEFT FOOT DUE TO INFECTION FROM  NAIL PUNCTURE INJURY  . LUMBAR LAMINECTOMY/DECOMPRESSION MICRODISCECTOMY N/A 10/18/2016   Procedure: LEFT AND CENTRAL L4-5 LUMBAR  LAMINECTOMY WITH RESECTION OF SYNOVIAL CYST;  Surgeon: Jessy Oto, MD;  Location: [facility];  Service: Orthopedics;  Laterality: N/A;  . ORIF FEMUR FRACTURE Left 04/19/2020   Procedure: OPEN REDUCTION INTERNAL FIXATION FEMORAL SHAFT FRACTURE;  Surgeon: Shona Needles, MD;  Location: [facility];  Service: Orthopedics;  Laterality: Left;  . PENILE PROSTHESIS IMPLANT  1990s  . PERIPHERAL VASCULAR CATHETERIZATION N/A 02/14/2016   Procedure: Abdominal Aortogram w/Lower Extremity;  Surgeon: Wellington Hampshire, MD;  Location: [facility];  Service: Cardiovascular;  Laterality: N/A;  . PERIPHERAL VASCULAR CATHETERIZATION Left 02/28/2016   Procedure: Peripheral Vascular Balloon Angioplasty;  Surgeon: Wellington Hampshire, MD;  Location: [facility];  Service: Cardiovascular;  Laterality: Left;  left peroneal and popliteal artery  . [place]  . TOTAL HIP ARTHROPLASTY Left 12/27/2016   Procedure: LEFT TOTAL HIP ARTHROPLASTY ANTERIOR APPROACH;  Surgeon: Mcarthur Rossetti, MD;  Location: [facility];  Service: Orthopedics;  Laterality: Left;  . TRANSURETHRAL RESECTION OF BLADDER TUMOR  10/30/2011   Procedure: TRANSURETHRAL RESECTION OF BLADDER TUMOR (TURBT);  Surgeon: Molli Hazard, MD;  Location: [facility];  Service: Urology;  Laterality: N/A;  .  TRANSURETHRAL RESECTION OF BLADDER TUMOR  12/25/2011   Procedure: TRANSURETHRAL RESECTION OF BLADDER TUMOR (TURBT);  Surgeon: Molli Hazard, MD;  Location: [facility];  Service: Urology;  Laterality: N/A;  need long gyrus instruments   . VASECTOMY  1990s    Assessment & Plan Clinical Impression: Patient is a 86 year old male with history of CAD, T2DM with neuropathy, HTN, COPD- oxygen prn, CKD who was admitted on 04/18/2020 after fall, landing on his left hip.  Patient fell off the curb onto left hip with reports of hip and rib pain. Work up done revealing oblique peri-prosthetic proximal femur  fracture and moderate to advanced right hip OA.  He was placed in Buck's traction and Dr. [name] consulted due to complexity of injury. Patient underwent  ORIF left proximal femur fracture on 04/19/2020 -- to be TDWB on LLE and started on lovenox for DVT prophylaxis.  He has had issues with insomnia, pain and acute on chronic renal failure with rise in SCr to  2.71. Renal ultrasound done 6/12 due to acute on chronic renal failure and showed medical renal disease without nephrosis.  Hospital course complicated by gross hematuria with clots and drop in H/H to  7.3/22.3. he was transfused with one unit PRBC. 3 way foley placed and CBI initiated -- bladder also hand flushed by Dr. Ishmael Holter.  Foley was removed on 06/14 and condom cath in place. He has had worsening of renal status with K+ 5.8 and treated with lokelma yesterday. SCr has been on rise to 2.17. CT head done 04/24/2020 due to lethargy and showed chronic small vessel disease and generalized brain atrophy. Therapy ongoing and he is showing improvement in bed mobility-->was incontinent of bowel this am.  He was immobile over the weekend due to need for CBI but  tolerated up in chair for 1 1/2 hours yesterday. Continues to have pain limiting mobility. Patient transferred to CIR on 04/26/2020 .   Patient currently requires max with mobility secondary to muscle weakness, decreased cardiorespiratoy endurance and decreased oxygen support, decreased initiation and decreased sitting balance, decreased standing balance, decreased balance strategies and difficulty maintaining precautions.  Prior to hospitalization, patient was modified independent  with mobility and lived with Spouse in a House home.  Home access is 2 .  Patient will benefit from skilled PT intervention to maximize safe functional mobility, minimize fall risk and decrease caregiver burden for planned discharge home with 24 hour supervision.  Anticipate patient will benefit from follow up [place] at  discharge.  PT - End of Session Activity Tolerance: Tolerates 10 - 20 min activity with multiple rests Endurance Deficit: Yes Endurance Deficit Description: Requires multiple rest breaks during session. Pain limiting activity. PT Assessment Rehab Potential (ACUTE/IP ONLY): Good PT Barriers to Discharge: Home environment access/layout PT Patient demonstrates impairments in the following area(s): Balance;Behavior;Endurance;Motor;Pain;Safety;Sensory PT Transfers Functional Problem(s): Bed Mobility;Bed to Chair;Car PT Locomotion Functional Problem(s): Ambulation;Wheelchair Mobility;Stairs PT Plan PT Intensity: Minimum of 1-2 x/day ,45 to 90 minutes PT Frequency: 5 out of 7 days PT Duration Estimated Length of Stay: 14-16 days PT Treatment/Interventions: Ambulation/gait training;Community reintegration;DME/adaptive equipment instruction;Neuromuscular re-education;Psychosocial support;Stair training;UE/LE Strength taining/ROM;Wheelchair propulsion/positioning;Balance/vestibular training;Discharge planning;Functional electrical stimulation;Pain management;Skin care/wound management;Therapeutic Activities;UE/LE Coordination activities;Cognitive remediation/compensation;Disease management/prevention;Patient/family education;Splinting/orthotics;Functional mobility training;Therapeutic Exercise;Visual/perceptual remediation/compensation PT Transfers Anticipated Outcome(s): Supervision PT Locomotion Anticipated Outcome(s): Supervision with Wheelchair PT Recommendation Follow Up Recommendations: Home health PT;24 hour supervision/assistance Patient destination: Home Equipment Recommended: To be determined  Skilled Therapeutic Intervention  Evaluation completed (see details above  and below) with education on PT POC and goals and individual treatment initiated with focus on bed mobility, balance, and functional transfers.  1st Session: Pt recevied supine in bed and agreeable to therapy. Reports 5/10  pain in L hip. PT provides rest breaks and repositioning as needed to manage pain symptoms. Also spoke with RN about possibility of PRN pain meds as lack of pain control impeded participation during session. Supine to sit with maxA with verbal and tactile cues for sequencing and management of LLE and trunk. Pt performs sit to stand from elevated EOB with RW and heavy maxA from PT. PT assists with donning underpants and shorts in standing but pt maintains forward flexed posture and with heavy lean to R. Pt performs lateral scoot toward R to WC with maxA. Sit to stand in // bars with maxA and PT keeping own foot under pt's LLE to ensure maintaining TDWB. Pt returns to room via [facility] for time management and due to pt experiencing significant pain and fatigue. maxA for lateral transfer back to bed and maxA sit to supine. Pt left supine inb ed with alarm intact and all needs within reach.  2nd Session: Pt received asleep in bed. PT rouses pt and pt requesting to defer therapy. PT encourages pt to participate and he is eventually agreeable, though continues to report pain in L hip. Pt's wife reports pt received pain meds 30 minutes prior to session. PT provides rest breaks as needed ot manage pain. Supine to sit with maxA. MaxA for lateral transfer to [facility] and pt is semi-continent of urine, having released some during bed mobility prior to transfer to commode. Pt repeatedly requesting to go back to bed and PT educates on importance of participation. Slideboard transfer to [facility] with maxA. Pt left seated in WC with all needs within reach.   PT Evaluation Precautions/Restrictions Precautions Precautions: Fall Restrictions Weight Bearing Restrictions: Yes LLE Weight Bearing: Touchdown weight bearing General Chart Reviewed: Yes Family/Caregiver Present: No  Pain Pain Assessment Pain Scale: 0-10 Pain Score: 6  Pain Location: Leg Pain Orientation: Left Home Living/Prior Functioning Home Living Available Help at  Discharge: Family;Available 24 hours/day Type of Home: House Home Access: Stairs to enter [employer] of Steps: 2 Entrance Stairs-Rails: Left Home Layout: One level Additional Comments: sleeps in lift chair, per EMR  Lives With: Spouse Prior Function Level of Independence: Independent with transfers;Requires assistive device for independence;Independent with basic ADLs  Able to Take Stairs?: Yes Comments: uses hurrycane and occasionally RW Vision/Perception  Perception Perception: Within Functional Limits Praxis Praxis: Intact  Cognition Overall Cognitive Status: Within Functional Limits for tasks assessed Arousal/Alertness: Awake/alert Orientation Level: Oriented X4 Safety/Judgment: Appears intact Sensation Sensation Light Touch: Impaired by gross assessment Additional Comments: Peripheral neuropathy. Light touch diminished in B feet. Coordination Gross Motor Movements are Fluid and Coordinated: Yes Motor  Motor Motor: Within Functional Limits  Mobility Bed Mobility Bed Mobility: Supine to Sit;Sit to Supine Supine to Sit: Maximal Assistance - Patient - Patient 25-49% Sit to Supine: Maximal Assistance - Patient 25-49% Transfers Transfers: Sit to Stand;Stand to Sit;Lateral/Scoot Transfers Sit to Stand: Maximal Assistance - Patient 25-49% Stand to Sit: Maximal Assistance - Patient 25-49% Lateral/Scoot Transfers: Maximal Assistance - Patient 25-49% Transfer (Assistive device): 1 person hand held assist Locomotion  Gait Ambulation: No Gait Gait: No Stairs / Additional Locomotion Stairs: No Wheelchair Mobility Wheelchair Mobility: No  Trunk/Postural Assessment  Cervical Assessment Cervical Assessment: Exceptions to [facility] (forward head) Thoracic Assessment Thoracic Assessment: Exceptions to  WFL (rounded shoulders) Lumbar Assessment Lumbar Assessment: Exceptions to [facility] (posterior pelvic tilt in sitting) Postural Control Postural Control: Within Functional  Limits  Balance Balance Balance Assessed: Yes Static Sitting Balance Static Sitting - Level of Assistance: 5: Stand by assistance Dynamic Sitting Balance Dynamic Sitting - Level of Assistance: 4: Min assist Static Standing Balance Static Standing - Level of Assistance: 2: Max assist Extremity Assessment  RLE Assessment RLE Assessment: Exceptions to [facility] General Strength Comments: Grossly 4/5 LLE Assessment LLE Assessment: Exceptions to [facility] General Strength Comments: Limited by pain. Grossly 2/5.    Refer to Care Plan for Long Term Goals  Recommendations for other services: None   Discharge Criteria: Patient will be discharged from PT if patient refuses treatment 3 consecutive times without medical reason, if treatment goals not met, if there is a change in medical status, if patient makes no progress towards goals or if patient is discharged from hospital.  The above assessment, treatment plan, treatment alternatives and goals were discussed and mutually agreed upon: by patient and by family  Breck Coons, PT, DPT 04/27/2020, 3:59 PM

## 2020-04-27 NOTE — Consult Note (Signed)
[facility] Renal Consultation Note  Requesting MD: Lovorn Indication for Consultation: A on CRF  HPI:  [name] Small is a 86 y.o. male with past medical history significant for diabetes mellitus, hypertension, COPD- on home O2, diffuse vascular disease to include coronary artery disease and peripheral arterial disease, bladder cancer status post resection.  Patient also has stage III/IV CKD in the setting of the above medical problems followed by Dr. Elmarie Shiley at [facility].  His creatinine for the most part remains around 2.  Patient had a mechanical fall and suffered a periprosthetic proximal femur fracture requiring operative management on 04/19/2020.  A few days postop he had an acute change in his kidney function with creatinine up to 2.7, over the next several days improved to a level of 1.99.  He was transferred to rehab yesterday.  Creatinine 1.99 on 6/15, 2.17 yesterday and 2.63 today so we are called to consult.  It appears that patient's been having some difficulty with urinary retention, has had to be bladder scanned and in and out cath - volume was 800 cc.  There is been no overt hypotension, no nephrotoxins are identified.  He denies any uremic symptoms.  He does not appear to be volume overloaded.  Urinalysis today negative for protein or cells.  Last renal ultrasound was done on 6/12-negative for hydro-, just showed cortical thinning  Creatinine  Date/Time Value Ref Range Status  04/29/2019 12:00 AM 2.0 (A) 0.6 - 1.3 Final   Creat  Date/Time Value Ref Range Status  03/04/2016 09:35 AM 1.48 (H) 0.70 - 1.11 mg/dL Final   Creatinine, Ser  Date/Time Value Ref Range Status  04/27/2020 05:45 AM 2.63 (H) 0.61 - 1.24 mg/dL Final  04/26/2020 08:31 AM 2.17 (H) 0.61 - 1.24 mg/dL Final  04/25/2020 03:40 AM 1.99 (H) 0.61 - 1.24 mg/dL Final  04/24/2020 02:52 AM 1.98 (H) 0.61 - 1.24 mg/dL Final  04/23/2020 09:15 AM 2.36 (H) 0.61 - 1.24 mg/dL Final  04/22/2020  06:20 AM 2.71 (H) 0.61 - 1.24 mg/dL Final  04/18/2020 11:36 AM 1.93 (H) 0.61 - 1.24 mg/dL Final  01/21/2020 08:16 AM 2.23 (H) 0.40 - 1.50 mg/dL Final  10/22/2019 09:11 AM 2.26 (H) 0.40 - 1.50 mg/dL Final  06/22/2019 09:04 AM 2.03 (H) 0.40 - 1.50 mg/dL Final  03/26/2019 10:08 AM 2.10 (H) 0.40 - 1.50 mg/dL Final  03/15/2019 08:55 AM 2.29 (H) 0.40 - 1.50 mg/dL Final  09/22/2018 08:39 AM 1.82 (H) 0.40 - 1.50 mg/dL Final  03/16/2018 09:13 AM 1.77 (H) 0.40 - 1.50 mg/dL Final  08/12/2017 09:21 AM 1.83 (H) 0.40 - 1.50 mg/dL Final  05/06/2017 09:27 AM 1.65 (H) 0.40 - 1.50 mg/dL Final  01/30/2017 12:31 PM 1.60 (H) 0.40 - 1.50 mg/dL Final  12/30/2016 04:10 AM 2.02 (H) 0.61 - 1.24 mg/dL Final  12/29/2016 04:24 AM 2.71 (H) 0.61 - 1.24 mg/dL Final  12/28/2016 11:48 AM 2.83 (H) 0.61 - 1.24 mg/dL Final  12/28/2016 04:38 AM 2.57 (H) 0.61 - 1.24 mg/dL Final  12/26/2016 02:43 PM 1.94 (H) 0.61 - 1.24 mg/dL Final  11/04/2016 07:33 PM 1.90 (H) 0.61 - 1.24 mg/dL Final  10/19/2016 01:22 AM 1.92 (H) 0.61 - 1.24 mg/dL Final  10/18/2016 10:01 AM 1.88 (H) 0.61 - 1.24 mg/dL Final  09/19/2016 12:45 PM 1.91 (H) 0.40 - 1.50 mg/dL Final  06/21/2016 12:29 PM 2.14 (H) 0.40 - 1.50 mg/dL Final  06/14/2016 07:46 AM 2.08 (H) 0.61 - 1.24 mg/dL Final  06/13/2016 06:01 AM 2.41 (  H) 0.61 - 1.24 mg/dL Final  06/12/2016 06:12 AM 2.05 (H) 0.61 - 1.24 mg/dL Final  06/11/2016 05:34 AM 2.10 (H) 0.61 - 1.24 mg/dL Final  06/10/2016 11:33 AM 1.84 (H) 0.61 - 1.24 mg/dL Final  04/23/2016 01:19 PM 1.72 (H) 0.40 - 1.50 mg/dL Final  03/29/2016 07:12 AM 1.77 (H) 0.61 - 1.24 mg/dL Final  03/28/2016 04:09 AM 1.84 (H) 0.61 - 1.24 mg/dL Final  03/26/2016 06:22 AM 2.14 (H) 0.61 - 1.24 mg/dL Final  03/23/2016 11:04 PM 2.20 (H) 0.61 - 1.24 mg/dL Final  02/29/2016 04:33 AM 1.65 (H) 0.61 - 1.24 mg/dL Final  02/28/2016 07:33 AM 1.77 (H) 0.61 - 1.24 mg/dL Final  02/23/2016 09:42 AM 1.91 (H) 0.76 - 1.27 mg/dL Final  02/14/2016 08:58 AM 1.88 (H) 0.61 -  1.24 mg/dL Final  02/12/2016 02:46 PM 1.96 (H) 0.76 - 1.27 mg/dL Final  10/30/2015 09:00 AM 2.17 (H) 0.40 - 1.50 mg/dL Final  08/02/2015 09:11 AM 1.83 (H) 0.40 - 1.50 mg/dL Final  07/18/2015 04:46 PM 2.03 (H) 0.40 - 1.50 mg/dL Final  03/14/2015 09:59 AM 1.81 (H) 0.40 - 1.50 mg/dL Final  11/29/2014 09:22 AM 1.85 (H) 0.40 - 1.50 mg/dL Final  04/12/2014 09:39 AM 2.1 (H) 0.40 - 1.50 mg/dL Final  01/21/2014 03:40 AM 1.82 (H) 0.50 - 1.35 mg/dL Final  01/19/2014 02:34 AM 2.08 (H) 0.50 - 1.35 mg/dL Final  01/18/2014 05:00 AM 2.35 (H) 0.50 - 1.35 mg/dL Final  01/17/2014 08:44 PM 2.08 (H) 0.50 - 1.35 mg/dL Final     PMHx:   Past Medical History:  Diagnosis Date  . (HFpEF) heart failure with preserved ejection fraction ([place])   . Arthritis   . Back pain    s/p lumbar injection 2014  . BENIGN PROSTATIC HYPERTROPHY 05/21/2007  . Bladder cancer ([place]) 10/2011  . CAD (coronary artery disease)    nonobstructive by cath 6/12:  mid LAD 30%, proximal obtuse marginal-2 30%, proximal RCA 20%, mid RCA 30-40%.  He had normal cardiac output and mildly elevated filling pressures but no significant pulmonary hypertension;   Echocardiogram in May 2012 demonstrated EF 50-55% and left atrial enlargement   . Cellulitis of left foot    Hospitalized in 2006  . Chronic kidney disease (CKD), stage III (moderate) 12/04/2007   FOLLOWED BY DR PATEL  . Chronic pain of lower extremity   . Complication of anesthesia    1 time bladder cancer surgery- 2012 , oxygen satruratioon dropped had to stay overnight, no problems since then.  Marland Kitchen COPD (chronic obstructive pulmonary disease) ([place])   . DIABETES MELLITUS, TYPE II 05/21/2007  . DIVERTICULOSIS, COLON 05/21/2007   pt denies  . Dyspnea    with exertion, patient mointors saturation  . Fatigue AGE-RELATED  . Gross hematuria    pt denies  . HYPERTENSION 05/21/2007  . Impaired hearing BILATERAL HEARING AIDS  . On home oxygen therapy    as needed  . PAD (peripheral artery  disease) ([place])    a. 02/2016 L foot nonhealing ulcer-->Periph angio: L pop 100 w/ reconstitution via extensive vollats to the prox-mid peroneal (only patent vessel BK)-->PTA of L Pop and peroneal; b. Ongoing LE ischemia 5 & 06/2016 req amputation of toes on L foot.  . Pneumonia    Hx  of   . Psoriasis ELBOWS  . Psoriasis   . PSORIASIS, SCALP 10/25/2008    Past Surgical History:  Procedure Laterality Date  . AMPUTATION Left 03/27/2016   Procedure: partial first AMPUTATION RAY; LEFT;  Surgeon: Trula Slade, DPM;  Location: [place];  Service: Podiatry;  Laterality: Left;  . AMPUTATION Left 06/12/2016   Procedure: TRANSMETATARSAL AMPUTATION LEFT FOOT;  Surgeon: Newt Minion, MD;  Location: [place];  Service: Orthopedics;  Laterality: Left;  . APPENDECTOMY  1941  . CARDIAC CATHETERIZATION  04-24-11/  DR Rogue Jury ARIDA   MILD NONOBSTRUCTIVE CAD, [place]MA CARDIAC OUTPUT  . CARDIOVASCULAR STRESS TEST  2007  . CATARACT EXTRACTION W/ INTRAOCULAR LENS  IMPLANT, BILATERAL Bilateral ~ 2010  . CYSTOSCOPY  12/25/2011   Procedure: CYSTOSCOPY;  Surgeon: Molli Hazard, MD;  Location[facility];  Service: Urology;  Laterality: N/A;  needs intubation and to be paralyzed   . CYSTOSCOPY W/ RETROGRADES  10/30/2011   Procedure: CYSTOSCOPY WITH RETROGRADE PYELOGRAM;  Surgeon: Molli Hazard, MD;  Location[facility]  Service: Urology;  Laterality: Bilateral;  CYSTOSCOPY POSS TURBT BILATERAL RETROGRADE PYLEOGRAM   . CYSTOSCOPY W/ RETROGRADES  11/30/2012   Procedure: CYSTOSCOPY WITH RETROGRADE PYELOGRAM;  Surgeon: Molli Hazard, MD;  Location: [facility];  Service: Urology;  Laterality: Bilateral;  Flexible cystoscopy.  . CYSTOSCOPY WITH BIOPSY  11/30/2012   Procedure: CYSTOSCOPY WITH BIOPSY;  Surgeon: Molli Hazard, MD;  Location: [facility];  Service: Urology;  Laterality: N/A;  . INCISION AND DRAINAGE FOOT Left ~ 2005    LEFT FOOT DUE TO INFECTION FROM  NAIL PUNCTURE INJURY  . LUMBAR LAMINECTOMY/DECOMPRESSION MICRODISCECTOMY N/A 10/18/2016   Procedure: LEFT AND CENTRAL L4-5 LUMBAR LAMINECTOMY WITH RESECTION OF SYNOVIAL CYST;  Surgeon: Jessy Oto, MD;  Location: [place];  Service: Orthopedics;  Laterality: N/A;  . ORIF FEMUR FRACTURE Left 04/19/2020   Procedure: OPEN REDUCTION INTERNAL FIXATION FEMORAL SHAFT FRACTURE;  Surgeon: Shona Needles, MD;  Location: [place];  Service: Orthopedics;  Laterality: Left;  . PENILE PROSTHESIS IMPLANT  1990s  . PERIPHERAL VASCULAR CATHETERIZATION N/A 02/14/2016   Procedure: Abdominal Aortogram w/Lower Extremity;  Surgeon: Wellington Hampshire, MD;  Location: [place] [facility];  Service: Cardiovascular;  Laterality: N/A;  . PERIPHERAL VASCULAR CATHETERIZATION Left 02/28/2016   Procedure: Peripheral Vascular Balloon Angioplasty;  Surgeon: Wellington Hampshire, MD;  Location: [place] [facility];  Service: Cardiovascular;  Laterality: Left;  left peroneal and popliteal artery  . [place]  . TOTAL HIP ARTHROPLASTY Left 12/27/2016   Procedure: LEFT TOTAL HIP ARTHROPLASTY ANTERIOR APPROACH;  Surgeon: Mcarthur Rossetti, MD;  Location: [facility];  Service: Orthopedics;  Laterality: Left;  . TRANSURETHRAL RESECTION OF BLADDER TUMOR  10/30/2011   Procedure: TRANSURETHRAL RESECTION OF BLADDER TUMOR (TURBT);  Surgeon: Molli Hazard, MD;  Location: [facility];  Service: Urology;  Laterality: N/A;  . TRANSURETHRAL RESECTION OF BLADDER TUMOR  12/25/2011   Procedure: TRANSURETHRAL RESECTION OF BLADDER TUMOR (TURBT);  Surgeon: Molli Hazard, MD;  Location: [facility];  Service: Urology;  Laterality: N/A;  need long gyrus instruments   . VASECTOMY  1990s    Family Hx:  Family History  Problem Relation Age of Onset  . Diabetes Mother   . Heart disease Brother   . Heart disease Brother   . Heart disease Brother   . Heart disease  Brother   . Heart disease Brother   . Heart disease Brother   . Lung cancer Brother     Social History:  reports that he quit smoking about 51 years ago. His smoking use included cigarettes.  He has a 44.00 pack-year smoking history. He has never used smokeless tobacco. He reports that he does not drink alcohol and does not use drugs.  Allergies:  Allergies  Allergen Reactions  . Actos [Pioglitazone Hydrochloride] Other (See Comments)    Held 2012 bladder cancer  . Aspirin Other (See Comments)    Held 2012 due to hematuria and bruising.   . Ace Inhibitors Cough  . Bactrim Rash and Other (See Comments)    Rash, presumed allergy  . Gabapentin Nausea And Vomiting    Upset stomach and diarrhea - tolerates low doses  . Lyrica [Pregabalin] Swelling    swelling  . Pravastatin Swelling    Swelling of feet  . Sulfa Drugs Cross Reactors Rash    Medications: Prior to Admission medications   Medication Sig Start Date End Date Taking? Authorizing Provider  acetaminophen (TYLENOL) 500 MG tablet Take 1,000 mg by mouth every 8 (eight) hours as needed for mild pain, moderate pain or headache.    [provider]  amLODipine (NORVASC) 2.5 MG tablet Take 1 tablet (2.5 mg total) by mouth daily. 01/31/20   Tonia Ghent, MD  Apremilast 30 MG TABS Take 1 tablet by mouth daily. 03/30/19   Tonia Ghent, MD  atorvastatin (LIPITOR) 10 MG tablet TAKE 1 TABLET BY MOUTH  DAILY Patient taking differently: Take 10 mg by mouth daily.  07/12/19   Tonia Ghent, MD  budesonide-formoterol [facility]) 160-4.5 MCG/ACT inhaler Inhale 2 puffs into the lungs 2 (two) times daily as needed (shortness of breath).  01/21/14   Tonia Ghent, MD  clobetasol (TEMOVATE) 0.05 % external solution Apply 1 application topically daily as needed (rash).     [provider]  clopidogrel (PLAVIX) 75 MG tablet TAKE 1 TABLET BY MOUTH  DAILY Patient taking differently: Take 75 mg by mouth daily.  06/18/19    Tonia Ghent, MD  escitalopram (LEXAPRO) 5 MG tablet Take 1 tablet (5 mg total) by mouth daily. Patient not taking: Reported on 01/31/2020 12/13/19   Tonia Ghent, MD  furosemide (LASIX) 40 MG tablet TAKE 1 TABLET BY MOUTH  EVERY MONDAY, [place],  AND FRIDAY Patient taking differently: Take 40 mg by mouth every Monday, Wednesday, and Friday.  06/11/19   Tonia Ghent, MD  glipiZIDE (GLUCOTROL XL) 5 MG 24 hr tablet TAKE 1 TABLET BY MOUTH  DAILY WITH BREAKFAST Patient taking differently: Take 5 mg by mouth daily with breakfast.  06/18/19   Tonia Ghent, MD  HYDROcodone-acetaminophen (NORCO/VICODIN) 5-325 MG tablet Take 1 tablet by mouth every 6 (six) hours as needed for severe pain. 04/23/20   Delray Alt, PA-C  insulin glargine (LANTUS) 100 UNIT/ML injection Inject 0.15 mLs (15 Units total) into the skin daily. Patient taking differently: Inject 15 Units into the skin at bedtime.  10/17/16   Tonia Ghent, MD  methocarbamol (ROBAXIN) 500 MG tablet Take 1 tablet (500 mg total) by mouth every 6 (six) hours as needed for muscle spasms. 04/23/20   Delray Alt, PA-C  nystatin (MYCOSTATIN/NYSTOP) powder Apply to affected area twice a day Patient taking differently: Apply 1 application topically 2 (two) times daily as needed (fungal infections).  05/11/19   Tonia Ghent, MD  tiotropium (SPIRIVA) 18 MCG inhalation capsule Place 1 capsule (18 mcg total) into inhaler and inhale daily. 09/25/18   Tonia Ghent, MD    I have reviewed the patient's current medications.  Labs:  Results for orders  placed or performed during the hospital encounter of 04/26/20 (from the past 48 hour(s))  Glucose, capillary     Status: Abnormal   Collection Time: 04/26/20  4:55 PM  Result Value Ref Range   Glucose-Capillary 226 (H) 70 - 99 mg/dL    Comment: Glucose reference range applies only to samples taken after fasting for at least 8 hours.  Glucose, capillary     Status: Abnormal   Collection  Time: 04/26/20  9:28 PM  Result Value Ref Range   Glucose-Capillary 179 (H) 70 - 99 mg/dL    Comment: Glucose reference range applies only to samples taken after fasting for at least 8 hours.  CBC WITH DIFFERENTIAL     Status: Abnormal   Collection Time: 04/27/20  5:45 AM  Result Value Ref Range   WBC 10.8 (H) 4.0 - 10.5 K/uL   RBC 2.22 (L) 4.22 - 5.81 MIL/uL   Hemoglobin 7.3 (L) 13.0 - 17.0 g/dL   HCT 22.4 (L) 39 - 52 %   MCV 100.9 (H) 80.0 - 100.0 fL   MCH 32.9 26.0 - 34.0 pg   MCHC 32.6 30.0 - 36.0 g/dL   RDW 13.0 11.5 - 15.5 %   Platelets 298 150 - 400 K/uL   nRBC 0.0 0.0 - 0.2 %   Neutrophils Relative % 68 %   Neutro Abs 7.3 1.7 - 7.7 K/uL   Lymphocytes Relative 12 %   Lymphs Abs 1.3 0.7 - 4.0 K/uL   Monocytes Relative 13 %   Monocytes Absolute 1.4 (H) 0 - 1 K/uL   Eosinophils Relative 3 %   Eosinophils Absolute 0.3 0 - 0 K/uL   Basophils Relative 0 %   Basophils Absolute 0.0 0 - 0 K/uL   Immature Granulocytes 4 %   Abs Immature Granulocytes 0.47 (H) 0.00 - 0.07 K/uL    Comment: Performed at [facility], [address], [place], [place] [address]  Comprehensive metabolic panel     Status: Abnormal   Collection Time: 04/27/20  5:45 AM  Result Value Ref Range   Sodium 135 135 - 145 mmol/L   Potassium 4.8 3.5 - 5.1 mmol/L   Chloride 98 98 - 111 mmol/L   CO2 27 22 - 32 mmol/L   Glucose, Bld 106 (H) 70 - 99 mg/dL    Comment: Glucose reference range applies only to samples taken after fasting for at least 8 hours.   BUN 71 (H) 8 - 23 mg/dL   Creatinine, Ser 2.63 (H) 0.61 - 1.24 mg/dL   Calcium 8.1 (L) 8.9 - 10.3 mg/dL   Total Protein 5.1 (L) 6.5 - 8.1 g/dL   Albumin 2.2 (L) 3.5 - 5.0 g/dL   AST 16 15 - 41 U/L   ALT 7 0 - 44 U/L   Alkaline Phosphatase 38 38 - 126 U/L   Total Bilirubin 0.9 0.3 - 1.2 mg/dL   GFR calc non Af Amer 21 (L) >60 mL/min   GFR calc Af Amer 24 (L) >60 mL/min   Anion gap 10 5 - 15    Comment: Performed at [facility]  [address] [place], [place] [address]  Glucose, capillary     Status: None   Collection Time: 04/27/20  6:07 AM  Result Value Ref Range   Glucose-Capillary 99 70 - 99 mg/dL    Comment: Glucose reference range applies only to samples taken after fasting for at least 8 hours.  Glucose, capillary  Status: Abnormal   Collection Time: 04/27/20 11:13 AM  Result Value Ref Range   Glucose-Capillary 179 (H) 70 - 99 mg/dL    Comment: Glucose reference range applies only to samples taken after fasting for at least 8 hours.  Urinalysis, Routine w reflex microscopic     Status: Abnormal   Collection Time: 04/27/20  4:17 PM  Result Value Ref Range   Color, Urine YELLOW YELLOW   APPearance CLEAR CLEAR   Specific Gravity, Urine 1.015 1.005 - 1.030   pH 5.0 5.0 - 8.0   Glucose, UA NEGATIVE NEGATIVE mg/dL   Hgb urine dipstick LARGE (A) NEGATIVE   Bilirubin Urine NEGATIVE NEGATIVE   Ketones, ur NEGATIVE NEGATIVE mg/dL   Protein, ur NEGATIVE NEGATIVE mg/dL   Nitrite NEGATIVE NEGATIVE   Leukocytes,Ua TRACE (A) NEGATIVE    Comment: Performed at [facility] [address] [place], [place] [address]  Urinalysis, Microscopic (reflex)     Status: Abnormal   Collection Time: 04/27/20  4:17 PM  Result Value Ref Range   RBC / HPF 0-5 0 - 5 RBC/hpf   WBC, UA NONE SEEN 0 - 5 WBC/hpf   Bacteria, UA FEW (A) NONE SEEN   Squamous Epithelial / LPF 0-5 0 - 5   Urine-Other LESS THAN 10 mL OF URINE SUBMITTED     Comment: MICROSCOPIC EXAM PERFORMED ON UNCONCENTRATED URINE Performed at [facility], [place] [address] [place], [place] [address]   Glucose, capillary     Status: Abnormal   Collection Time: 04/27/20  4:22 PM  Result Value Ref Range   Glucose-Capillary 113 (H) 70 - 99 mg/dL    Comment: Glucose reference range applies only to samples taken after fasting for at least 8 hours.     ROS:  A comprehensive review of systems was negative except for: Musculoskeletal: positive for Left hip  pain  Physical Exam: Vitals:   04/27/20 0753 04/27/20 1619  BP: 122/62 (!) 129/56  Pulse:  68  Resp:  18  Temp:  97.9 F (36.6 C)  SpO2:  99%     General: Appears younger than age 80.  No acute distress HEENT: Pupils are equal round reactive to light, extraocular motions are intact, mucous membranes are moist Neck: No JVD Heart: Regular rate and rhythm Lungs: Clear to auscultation bilaterally Abdomen: Soft, nontender, nondistended Extremities: Left leg with edema-side of hip fracture and operative repair, right leg no edema Skin: Warm and dry Neuro: Alert and nonfocal  Assessment/Plan: 86 year old Hispanic male with known CKD at baseline-creatinine around 2.  He is now hospitalized with hip fracture status post operative repair now in rehab.  Over last 48 hours is suffered acute on chronic renal failure 1.Renal-over last 2 days acute on chronic renal failure.  Urinalysis is bland.  Kidneys have not been reimaged.  The most glaring thing is that he has had some urinary retention requiring in and out catheterizations.  I suspect this may be the main reason why his creatinine is gone up.  Possibly the pain medications have caused some urinary retention.  I am going to go ahead and start Flomax-but will discontinue the amlodipine so blood pressure does not get too low.  We will hope to see plateau and improvement in renal function.  Given his age and multiple comorbid conditions not sure he would be the best dialysis candidate 2. Hypertension/volume  -blood pressure and volume status seems fine.  He is on very low-dose amlodipine and I  will just stop as I am starting Flomax to assist with the urinary retention issue 3. Anemia  -hemoglobin very low in the setting of hip surgery.  Will check iron stores and give dose of ESA   Louis Meckel 04/27/2020, 5:06 PM

## 2020-04-27 NOTE — Progress Notes (Signed)
[place] PHYSICAL MEDICINE & REHABILITATION PROGRESS NOTE   Subjective/Complaints: Needs to urinate. No dysuria. Denies pain. Slept well last night. WBC increased to 10.8 this morning.  ROS: denies pain, dysuria.  Objective:   No results found. Recent Labs    04/25/20 0340 04/27/20 0545  WBC 10.0 10.8*  HGB 8.2* 7.3*  HCT 25.7* 22.4*  PLT 268 298   Recent Labs    04/26/20 0831 04/27/20 0545  NA 135 135  K 4.6 4.8  CL 98 98  CO2 28 27  GLUCOSE 129* 106*  BUN 59* 71*  CREATININE 2.17* 2.63*  CALCIUM 8.3* 8.1*    Intake/Output Summary (Last 24 hours) at 04/27/2020 1138 Last data filed at 04/27/2020 0900 Gross per 24 hour  Intake 120 ml  Output 400 ml  Net -280 ml     Physical Exam: Vital Signs Blood pressure 122/62, pulse 62, temperature 98 F (36.7 C), temperature source Oral, resp. rate 20, height 6\' 2"  (1.88 m), weight 100.3 kg, SpO2 98 %. Nursing note and vitals reviewed. Constitutional: No distress.  Slept thorough most of exam--aroused briefly to answer occasional question. . On 2 L oxygen per [place].   HENT:  Head: Normocephalic and atraumatic.  Right Ear: External ear normal.  Left Ear: External ear normal.  Nose: Nose normal.  Eyes: Right eye exhibits no discharge. Left eye exhibits no discharge.  Respiratory: Effort normal. No stridor. No respiratory distress.  GI: Normal appearance. He exhibits distension. There is no abdominal tenderness.  Slightly tight  Genitourinary:  No longer with condom cath.    Musculoskeletal:        General: Swelling present.     Cervical back: Normal range of motion.     Comments: Moderate edema left hip to knee.  Left transmet amputation site well healed.   Neurological: He is alert.  Motor: B/l UE: 5/5 proximal to distal RLE: HF: 2+/5, KE 3/5, ADF 4+/5 LLE: HF 2-/5, KE 2+/5, ADF 3/5 Dysphonia HOH  Skin: Skin is warm and dry.  Healed left TMA  Psychiatric: His affect is blunt. His speech is delayed. He is  slowed. He has a flat affect. Lethargic.   Assessment/Plan: 1. Functional deficits secondary to deficits with mobility, endurance, self-care secondary to left periprosthetic femur fracture, which require 3+ hours per day of interdisciplinary therapy in a comprehensive inpatient rehab setting.  Physiatrist is providing close team supervision and 24 hour management of active medical problems listed below.  Physiatrist and rehab team continue to assess barriers to discharge/monitor patient progress toward functional and medical goals  Care Tool:  Bathing              Bathing assist       Upper Body Dressing/Undressing Upper body dressing   What is the patient wearing?: Hospital gown only    Upper body assist Assist Level: Maximal Assistance - Patient 25 - 49%    Lower Body Dressing/Undressing Lower body dressing      What is the patient wearing?: Hospital gown only     Lower body assist Assist for lower body dressing: Maximal Assistance - Patient 25 - 49%     Toileting Toileting    Toileting assist Assist for toileting: Independent with assistive device Assistive Device Comment: Urinal   Transfers Chair/bed transfer  Transfers assist           Locomotion Ambulation   Ambulation assist              Walk  10 feet activity   Assist           Walk 50 feet activity   Assist           Walk 150 feet activity   Assist           Walk 10 feet on uneven surface  activity   Assist           Wheelchair     Assist               Wheelchair 50 feet with 2 turns activity    Assist            Wheelchair 150 feet activity     Assist          Blood pressure 122/62, pulse 62, temperature 98 F (36.7 C), temperature source Oral, resp. rate 20, height 6\' 2"  (1.88 m), weight 100.3 kg, SpO2 98 %.    Medical Problem List and Plan: 1.  Deficits with mobility, endurance, self-care secondary to left  periprosthetic femur fracture.              -patient may not  shower             -ELOS/Goals: 10-14 days/Min A            -Initial CIR therapies today 2.  Antithrombotics: -DVT/anticoagulation:  Mechanical: Sequential compression devices, below knee Bilateral lower extremities Will check dopplers.             -antiplatelet therapy: N/A 3. Pain Management: Has been on hydrocodone 5 mg every 8 hours. Changed to PRN as sedating patient and it makes more sense for him to take before therapy sessions.              Monitor with increased exertion 4. Mood: LCSW to follow for evaluation and support.              -antipsychotic agents: N/a 5. Neuropsych: This patient is not fully capable of making decisions on his own behalf. 6. Skin/Wound Care: Monitor wound for healing.  7. Fluids/Electrolytes/Nutrition: Monitor I/O. CMP ordered for tomorrow AM. 8. HTN: Monitor BP tid- continue Norvasc.             Monitor with increased mobility.  9. T2DM with hyperglycemia: Monitor BS ac/hs.  Lantus increased to 15 units bid today. Now on Lantus bid with SSI for elevated BS.              Monitor with increased mobility.  10. COPD: Wean oxygen as tolerated. Continue Breo/Incruse.  11. Acute on chronic renal failure: Trending to baseline line? Will order PVRs to monitor voiding.             Creatinine trending upward on 6/17: Bolus 250cc and repeat tomorrow.  12. ABLA/Hematuria: Continues--will continue to monitor H/H. Off Plavix and Lovenox at this time.             Hgb trending downward on 6/17. Repeat tomorrow.  13. Constipation: last BM 6/15- change senna to senna-docusate BID.  LOS: 1 days A FACE TO FACE EVALUATION WAS PERFORMED  Martha Clan P [name] Small 04/27/2020, 11:38 AM

## 2020-04-27 NOTE — Evaluation (Signed)
Occupational Therapy Assessment and Plan  Patient Details  Name: [name] Small MRN: [id number] Date of Birth: 02/09/1928  OT Diagnosis: acute pain and ORIF of L hip, generalized weakness and deconditioning Rehab Potential:   ELOS: 14-16 days   Today's Date: 04/27/2020 OT Individual Time: 1400-1500 OT Individual Time Calculation (min): 60 min     Problem List:  Patient Active Problem List   Diagnosis Date Noted  . Femur fracture, left ([place]) 04/26/2020  . Periprosthetic hip fracture 04/26/2020  . Acute kidney injury ([place])   . Closed left subtrochanteric femur fracture, initial encounter ([place])   . Acute blood loss anemia   . Gross hematuria   . Controlled type 2 diabetes mellitus with hyperglycemia, with long-term current use of insulin ([place])   . Benign essential HTN   . Post-operative pain   . Closed left hip fracture, initial encounter ([place]) 04/18/2020  . Anxiety 12/15/2019  . Laceration of right thumb without foreign body with damage to nail 12/15/2019  . Anemia 01/31/2017  . Hypoxia   . History of hip replacement 12/26/2016    Class: Chronic  . Status post lumbar laminectomy 12/26/2016    Class: Chronic  . S/p left hip fracture 12/26/2016  . Idiopathic chronic venous hypertension of left lower extremity with inflammation 11/07/2016  . Spinal stenosis of lumbar region with neurogenic claudication 10/18/2016  . Spinal stenosis of lumbar region 09/25/2016  . Fall 07/04/2016  . COPD (chronic obstructive pulmonary disease) ([place]) 06/10/2016  . Weakness 03/18/2016  . Critical lower limb ischemia 02/28/2016  . Peripheral arterial disease ([place]) 10/17/2015  . Edema 07/19/2015  . Claudication ([place]) 01/31/2015  . DOE (dyspnea on exertion) 01/31/2015  . Hyperlipidemia 01/31/2015  . HTN (hypertension) 01/31/2015  . Chronic diastolic CHF (congestive heart failure) ([place]) 01/21/2014  . Atrial fibrillation ([place]) 12/16/2012  . Hyperkalemia 11/27/2011  . Bladder cancer ([place])  09/22/2011  . Leg cramps 08/28/2011  . Cough 05/26/2011  . CAD (coronary artery disease) of artery bypass graft 05/09/2011  . Dyspnea on exertion 04/19/2011  . PSORIASIS, SCALP 10/25/2008  . Chronic kidney disease, stage III (moderate) 12/04/2007  . Type 2 diabetes mellitus with diabetic polyneuropathy ([place]) 05/21/2007  . DIVERTICULOSIS, COLON 05/21/2007  . BPH (benign prostatic hyperplasia) 05/21/2007    Past Medical History:  Past Medical History:  Diagnosis Date  . (HFpEF) heart failure with preserved ejection fraction ([place])   . Arthritis   . Back pain    s/p lumbar injection 2014  . BENIGN PROSTATIC HYPERTROPHY 05/21/2007  . Bladder cancer ([place]) 10/2011  . CAD (coronary artery disease)    nonobstructive by cath 6/12:  mid LAD 30%, proximal obtuse marginal-2 30%, proximal RCA 20%, mid RCA 30-40%.  He had normal cardiac output and mildly elevated filling pressures but no significant pulmonary hypertension;   Echocardiogram in May 2012 demonstrated EF 50-55% and left atrial enlargement   . Cellulitis of left foot    Hospitalized in 2006  . Chronic kidney disease (CKD), stage III (moderate) 12/04/2007   FOLLOWED BY DR PATEL  . Chronic pain of lower extremity   . Complication of anesthesia    1 time bladder cancer surgery- 2012 , oxygen satruratioon dropped had to stay overnight, no problems since then.  Marland Kitchen COPD (chronic obstructive pulmonary disease) ([place])   . DIABETES MELLITUS, TYPE II 05/21/2007  . DIVERTICULOSIS, COLON 05/21/2007   pt denies  . Dyspnea    with exertion, patient mointors saturation  . Fatigue AGE-RELATED  .  Gross hematuria    pt denies  . HYPERTENSION 05/21/2007  . Impaired hearing BILATERAL HEARING AIDS  . On home oxygen therapy    as needed  . PAD (peripheral artery disease) ([place])    a. 02/2016 L foot nonhealing ulcer-->Periph angio: L pop 100 w/ reconstitution via extensive vollats to the prox-mid peroneal (only patent vessel BK)-->PTA of L Pop and  peroneal; b. Ongoing LE ischemia 5 & 06/2016 req amputation of toes on L foot.  . Pneumonia    Hx  of   . Psoriasis ELBOWS  . Psoriasis   . PSORIASIS, SCALP 10/25/2008   Past Surgical History:  Past Surgical History:  Procedure Laterality Date  . AMPUTATION Left 03/27/2016   Procedure: partial first AMPUTATION RAY; LEFT;  Surgeon: Trula Slade, DPM;  Location: [facility];  Service: Podiatry;  Laterality: Left;  . AMPUTATION Left 06/12/2016   Procedure: TRANSMETATARSAL AMPUTATION LEFT FOOT;  Surgeon: Newt Minion, MD;  Location: [facility];  Service: Orthopedics;  Laterality: Left;  . APPENDECTOMY  1941  . CARDIAC CATHETERIZATION  04-24-11/  DR Rogue Jury ARIDA   MILD NONOBSTRUCTIVE CAD, NORMA CARDIAC OUTPUT  . CARDIOVASCULAR STRESS TEST  2007  . CATARACT EXTRACTION W/ INTRAOCULAR LENS  IMPLANT, BILATERAL Bilateral ~ 2010  . CYSTOSCOPY  12/25/2011   Procedure: CYSTOSCOPY;  Surgeon: Molli Hazard, MD;  Location: [facility];  Service: Urology;  Laterality: N/A;  needs intubation and to be paralyzed   . CYSTOSCOPY W/ RETROGRADES  10/30/2011   Procedure: CYSTOSCOPY WITH RETROGRADE PYELOGRAM;  Surgeon: Molli Hazard, MD;  [facility];  Service: Urology;  Laterality: Bilateral;  CYSTOSCOPY POSS TURBT BILATERAL RETROGRADE PYLEOGRAM   . CYSTOSCOPY W/ RETROGRADES  11/30/2012   Procedure: CYSTOSCOPY WITH RETROGRADE PYELOGRAM;  Surgeon: Molli Hazard, MD;  Location: [facility];  Service: Urology;  Laterality: Bilateral;  Flexible cystoscopy.  . CYSTOSCOPY WITH BIOPSY  11/30/2012   Procedure: CYSTOSCOPY WITH BIOPSY;  Surgeon: Molli Hazard, MD;  Location: [facility];  Service: Urology;  Laterality: N/A;  . INCISION AND DRAINAGE FOOT Left ~ 2005   LEFT FOOT DUE TO INFECTION FROM  NAIL PUNCTURE INJURY  . LUMBAR LAMINECTOMY/DECOMPRESSION MICRODISCECTOMY N/A 10/18/2016   Procedure: LEFT AND CENTRAL L4-5 LUMBAR  LAMINECTOMY WITH RESECTION OF SYNOVIAL CYST;  Surgeon: Jessy Oto, MD;  Location: [facility];  Service: Orthopedics;  Laterality: N/A;  . ORIF FEMUR FRACTURE Left 04/19/2020   Procedure: OPEN REDUCTION INTERNAL FIXATION FEMORAL SHAFT FRACTURE;  Surgeon: Shona Needles, MD;  Location: [facility];  Service: Orthopedics;  Laterality: Left;  . PENILE PROSTHESIS IMPLANT  1990s  . PERIPHERAL VASCULAR CATHETERIZATION N/A 02/14/2016   Procedure: Abdominal Aortogram w/Lower Extremity;  Surgeon: Wellington Hampshire, MD;  Location: [facility];  Service: Cardiovascular;  Laterality: N/A;  . PERIPHERAL VASCULAR CATHETERIZATION Left 02/28/2016   Procedure: Peripheral Vascular Balloon Angioplasty;  Surgeon: Wellington Hampshire, MD;  Location: [place] [facility];  Service: Cardiovascular;  Laterality: Left;  left peroneal and popliteal artery  . [place]  . TOTAL HIP ARTHROPLASTY Left 12/27/2016   Procedure: LEFT TOTAL HIP ARTHROPLASTY ANTERIOR APPROACH;  Surgeon: Mcarthur Rossetti, MD;  Location: [facility];  Service: Orthopedics;  Laterality: Left;  . TRANSURETHRAL RESECTION OF BLADDER TUMOR  10/30/2011   Procedure: TRANSURETHRAL RESECTION OF BLADDER TUMOR (TURBT);  Surgeon: Molli Hazard, MD;  Location: [facility];  Service: Urology;  Laterality: N/A;  . TRANSURETHRAL RESECTION OF BLADDER TUMOR  12/25/2011   Procedure: TRANSURETHRAL RESECTION OF BLADDER TUMOR (TURBT);  Surgeon: Molli Hazard, MD;  Location: [facility];  Service: Urology;  Laterality: N/A;  need long gyrus instruments   . VASECTOMY  1990s    Assessment & Plan Clinical Impression: Patient is a 86 year old male with history of CAD, T2DM with neuropathy, HTN, COPD- oxygen prn, CKD who was admitted on 04/18/2020 after fall, landing on his left hip. Patient fell off the curb onto left hip with reports of hip and rib pain. Work up done revealing oblique peri-prosthetic proximal femur  fracture and moderate to advanced right hip OA. He was placed in Buck's traction and Dr. [name] consulted due to complexity of injury. Patient underwent ORIF left proximal femur fracture on 04/19/2020 -- to be TDWB on LLE and started on lovenox for DVT prophylaxis. He has had issues with insomnia, pain and acute on chronic renal failure with rise in SCr to 2.71. Renal ultrasound done 6/12 due to acute on chronic renal failure and showed medical renal disease without nephrosis. Hospital course complicated by gross hematuria with clots and drop in H/H to 7.3/22.3. he was transfused with one unit PRBC. 3 way foley placed and CBI initiated -- bladder also hand flushed by Dr. Ishmael Holter. Foley was removed on 06/14 and condom cath in place. He has had worsening of renal status with K+ 5.8 and treated with lokelma yesterday. SCr has been on rise to 2.17. CT head done 04/24/2020 due to lethargy and showed chronic small vessel disease and generalized brain atrophy. Therapy ongoing and he is showing improvement in bed mobility-->was incontinent of bowel this am. He was immobile over the weekend due to need for CBI but tolerated up in chair for 1 1/2 hours yesterday. Continues to have pain limiting mobility. Patient transferred to CIR on 04/26/2020 .   Patient currently requires max - total with basic self-care skills secondary to muscle weakness, decreased cardiorespiratoy endurance and decreased sitting balance, decreased standing balance, decreased postural control and difficulty maintaining precautions.  Prior to hospitalization, patient could complete BADLs with modified independent  (except LB dressing for socks/shoes).  Patient will benefit from skilled intervention to decrease level of assist with basic self-care skills and increase independence with basic self-care skills prior to discharge home with care partner.  Anticipate patient will require 24 hour supervision and follow up home health.  OT - End of  Session Endurance Deficit: Yes Endurance Deficit Description: Rest breaks required, on O2 at 2 L, fatigues quickly, pain limiting OT Assessment OT Barriers to Discharge: Medical stability OT Patient demonstrates impairments in the following area(s): Balance;Behavior;Endurance;Motor;Pain;Safety OT Basic ADL's Functional Problem(s): Bathing;Grooming;Toileting;Dressing OT Transfers Functional Problem(s): Toilet;Tub/Shower OT Plan OT Intensity: Minimum of 1-2 x/day, 45 to 90 minutes OT Frequency: 5 out of 7 days OT Duration/Estimated Length of Stay: 14-16 days OT Treatment/Interventions: Balance/vestibular training;Discharge planning;Pain management;Self Care/advanced ADL retraining;Therapeutic Activities;Therapeutic Exercise;Patient/family education;Functional mobility training;Disease mangement/prevention;Community reintegration;DME/adaptive equipment instruction;Neuromuscular re-education;Psychosocial support OT Self Feeding Anticipated Outcome(s): Independent OT Basic Self-Care Anticipated Outcome(s): Supervision overall but Min A for LB dressing (wife performed at [facility]) OT Toileting Anticipated Outcome(s): Supervision OT Bathroom Transfers Anticipated Outcome(s): Supervision OT Recommendation Patient destination: Home Follow Up Recommendations: Home health OT Equipment Recommended: To be determined   Skilled Therapeutic Intervention Pt greeted at time of session sitting up in wheelchair with wife present, stating "I want to go back to bed." After explaining role and  purpose of OT, pt agreeable to participate with significant encouragement. Doffed shirt with Min/Mod A and donned new shirt with Min A to get overhead and fully down back. O2 switched to portable tank and pt brought to the bathroom to perform sit to stands at grab bar, cues for hand placement to push up from surfaces to come up to a standing position, therapist support under LLE to prevent weight bearing but still pt unable to  maintain WB precautions and pain significant, relayed to nursing. Standing caused urgency and pt provided with urinal, Max A to manage penis from under legs d/t difficulty lifting buttocks. After toileting pt with significant hip pain, declined oral care, bathing, grooming, toileting, and changing pants/underwear this date. After locating bariatric Stedy, +2 assist to stand and transferred via Stedy to bed. Difficulty controlling descent to bed, performed sit to supine with Max A. Scooting up in bed with Mod/Max A with use of bed rails/headboard. Note that the pt's pain was significantly limiting, nursing made aware, and pt declined most ADLs. Difficulty maintaining WB precautions. Pt up in bed with alarm on, call bell in reach, all needs met and wife present.   OT Evaluation Precautions/Restrictions  Precautions Precautions: Fall Precaution Comments: TDWB LLE Restrictions Weight Bearing Restrictions: Yes LLE Weight Bearing: Touchdown weight bearing Other Position/Activity Restrictions: Pt has difficulty maintaining weight bearing. Vital Signs Therapy Vitals Temp: 97.9 F (36.6 C) Temp Source: Oral Pulse Rate: 68 Resp: 18 BP: (!) 129/56 Patient Position (if appropriate): Lying Oxygen Therapy SpO2: 99 % Pain Pain Assessment Pain Scale: 0-10 Pain Score: 5  Pain Location: Leg Pain Orientation: Left Home Living/Prior Functioning Home Living Family/patient expects to be discharged to:: Private residence Living Arrangements: Spouse/significant other Available Help at Discharge: Family, Available 24 hours/day Type of Home: House Home Access: Stairs to enter Technical brewer of Steps: 2 Entrance Stairs-Rails: Left Home Layout: One level Bathroom Shower/Tub: Walk-in shower Additional Comments: sleeps in lift chair, per EMR  Lives With: Spouse IADL History Homemaking Responsibilities: Yes Homemaking Comments: mowing lawn on riding mower Occupation: Retired Prior  Function Level of Independence: Independent with transfers, Requires assistive device for independence, Needs assistance with ADLs Dressing: Minimal  Able to Take Stairs?: Yes Comments: uses hurrycane and occasionally RW ADL ADL Where Assessed-Upper Body Dressing: Chair Lower Body Dressing: Dependent Toileting: Dependent Toilet Transfer: Maximal assistance Toilet Transfer Method: Other (comment) (slide board with other rehab staff, Stedy transfer) Vision Baseline Vision/History: No visual deficits Patient Visual Report: No change from baseline Perception  Perception: Within Functional Limits Praxis Praxis: Intact Cognition Overall Cognitive Status: Within Functional Limits for tasks assessed Arousal/Alertness: Awake/alert Year: 2021 Month: June Day of Week: Correct Memory: Appears intact Immediate Memory Recall: Sock;Blue;Bed Memory Recall Sock: Without Cue Memory Recall Blue: Without Cue Memory Recall Bed: Without Cue Awareness: Appears intact Behaviors: Poor frustration tolerance Safety/Judgment: Appears intact Sensation Sensation Light Touch: Impaired by gross assessment Additional Comments: Peripheral neuropathy. Light touch diminished in B feet. Coordination Gross Motor Movements are Fluid and Coordinated: Yes Fine Motor Movements are Fluid and Coordinated: Yes Motor  Motor Motor: Within Functional Limits Mobility  Bed Mobility Bed Mobility: Sit to Supine Supine to Sit: Maximal Assistance - Patient - Patient 25-49% Sit to Supine: Maximal Assistance - Patient 25-49% Transfers Sit to Stand: Maximal Assistance - Patient 25-49% Stand to Sit: Maximal Assistance - Patient 25-49%  Trunk/Postural Assessment  Cervical Assessment Cervical Assessment: Exceptions to [facility] Thoracic Assessment Thoracic Assessment: Exceptions to [facility] Lumbar Assessment Lumbar Assessment: Exceptions  to [facility] Postural Control Postural Control: Deficits on evaluation (decreased sitting  balance, possibly d/t fatigue)  Balance Balance Balance Assessed: Yes Static Sitting Balance Static Sitting - Level of Assistance: 5: Stand by assistance Dynamic Sitting Balance Dynamic Sitting - Level of Assistance: 4: Min assist Static Standing Balance Static Standing - Level of Assistance: 2: Max assist (Max - total) Extremity/Trunk Assessment RUE Assessment RUE Assessment: Within Functional Limits LUE Assessment LUE Assessment: Within Functional Limits     Refer to Care Plan for Long Term Goals  Recommendations for other services: None    Discharge Criteria: Patient will be discharged from OT if patient refuses treatment 3 consecutive times without medical reason, if treatment goals not met, if there is a change in medical status, if patient makes no progress towards goals or if patient is discharged from hospital.  The above assessment, treatment plan, treatment alternatives and goals were discussed and mutually agreed upon: by patient and by family  [name] 04/27/2020, 4:39 PM

## 2020-04-27 NOTE — Progress Notes (Signed)
Inpatient Rehabilitation  Patient information reviewed and entered into eRehab system by Wynn Alldredge M. Toriano Aikey, M.A., CCC/SLP, PPS Coordinator.  Information including medical coding, functional ability and quality indicators will be reviewed and updated through discharge.    

## 2020-04-27 NOTE — Progress Notes (Signed)
Pt voiding through out the night using the urinal. pts PVR bladder scan showed 721, In/Out cath performed, 800 ml obtained. Pts urine had large amount of bloody sediment, possibly clogging the catheter. Pt denies pain or discomfort with urination. Provider will be notified during morning rounds.

## 2020-04-28 ENCOUNTER — Inpatient Hospital Stay (HOSPITAL_COMMUNITY): Payer: Medicare Other

## 2020-04-28 ENCOUNTER — Other Ambulatory Visit: Payer: Self-pay | Admitting: Family Medicine

## 2020-04-28 ENCOUNTER — Inpatient Hospital Stay (HOSPITAL_COMMUNITY): Payer: Medicare Other | Admitting: Occupational Therapy

## 2020-04-28 LAB — GLUCOSE, CAPILLARY
Glucose-Capillary: 47 mg/dL — ABNORMAL LOW (ref 70–99)
Glucose-Capillary: 65 mg/dL — ABNORMAL LOW (ref 70–99)
Glucose-Capillary: 115 mg/dL — ABNORMAL HIGH (ref 70–99)
Glucose-Capillary: 140 mg/dL — ABNORMAL HIGH (ref 70–99)
Glucose-Capillary: 126 mg/dL — ABNORMAL HIGH (ref 70–99)
Glucose-Capillary: 196 mg/dL — ABNORMAL HIGH (ref 70–99)

## 2020-04-28 LAB — BASIC METABOLIC PANEL
Anion gap: 8 (ref 5–15)
BUN: 68 mg/dL — ABNORMAL HIGH (ref 8–23)
CO2: 28 mmol/L (ref 22–32)
Calcium: 8.1 mg/dL — ABNORMAL LOW (ref 8.9–10.3)
Chloride: 100 mmol/L (ref 98–111)
Creatinine, Ser: 2.3 mg/dL — ABNORMAL HIGH (ref 0.61–1.24)
GFR calc Af Amer: 28 mL/min — ABNORMAL LOW (ref >60)
GFR calc non Af Amer: 24 mL/min — ABNORMAL LOW (ref >60)
Glucose, Bld: 56 mg/dL — ABNORMAL LOW (ref 70–99)
Potassium: 4.3 mmol/L (ref 3.5–5.1)
Sodium: 136 mmol/L (ref 135–145)

## 2020-04-28 LAB — CBC WITH DIFFERENTIAL/PLATELET
Abs Immature Granulocytes: 0.44 10*3/uL — ABNORMAL HIGH (ref 0.00–0.07)
Basophils Absolute: 0.1 10*3/uL (ref 0.0–0.1)
Basophils Relative: 1 %
Eosinophils Absolute: 0.3 10*3/uL (ref 0.0–0.5)
Eosinophils Relative: 4 %
HCT: 23.9 % — ABNORMAL LOW (ref 39.0–52.0)
Hemoglobin: 7.6 g/dL — ABNORMAL LOW (ref 13.0–17.0)
Immature Granulocytes: 5 %
Lymphocytes Relative: 16 %
Lymphs Abs: 1.4 10*3/uL (ref 0.7–4.0)
MCH: 32.6 pg (ref 26.0–34.0)
MCHC: 31.8 g/dL (ref 30.0–36.0)
MCV: 102.6 fL — ABNORMAL HIGH (ref 80.0–100.0)
Monocytes Absolute: 1.2 10*3/uL — ABNORMAL HIGH (ref 0.1–1.0)
Monocytes Relative: 14 %
Neutro Abs: 5.2 10*3/uL (ref 1.7–7.7)
Neutrophils Relative %: 60 %
Platelets: 359 10*3/uL (ref 150–400)
RBC: 2.33 MIL/uL — ABNORMAL LOW (ref 4.22–5.81)
RDW: 13.1 % (ref 11.5–15.5)
WBC: 8.5 10*3/uL (ref 4.0–10.5)
nRBC: 0 % (ref 0.0–0.2)

## 2020-04-28 LAB — URINE CULTURE: Culture: NO GROWTH

## 2020-04-28 LAB — FERRITIN: Ferritin: 110 ng/mL (ref 24–336)

## 2020-04-28 LAB — IRON AND TIBC
Iron: 32 ug/dL — ABNORMAL LOW (ref 45–182)
Saturation Ratios: 15 % — ABNORMAL LOW (ref 17.9–39.5)
TIBC: 220 ug/dL — ABNORMAL LOW (ref 250–450)
UIBC: 188 ug/dL

## 2020-04-28 MED ORDER — INSULIN GLARGINE 100 UNIT/ML ~~LOC~~ SOLN
14.0000 [IU] | Freq: Two times a day (BID) | SUBCUTANEOUS | Status: DC
  Administered 2020-04-28: 14 [IU] via SUBCUTANEOUS
  Filled 2020-04-28 (×3): qty 0.14

## 2020-04-28 MED ORDER — SODIUM CHLORIDE 0.9 % IV SOLN
250.0000 mg | Freq: Every day | INTRAVENOUS | Status: AC
  Administered 2020-04-28 – 2020-05-01 (×4): 250 mg via INTRAVENOUS
  Filled 2020-04-28 (×4): qty 20

## 2020-04-28 NOTE — Care Management (Signed)
Inpatient [facility] Statement of Services  Patient Name:  [name] Small  Date:  04/28/2020  Welcome to the [facility].  Our goal is to provide you with an individualized program based on your diagnosis and situation, designed to meet your specific needs.  With this comprehensive rehabilitation program, you will be expected to participate in at least 3 hours of rehabilitation therapies Monday-Friday, with modified therapy programming on the weekends.  Your rehabilitation program will include the following services:  Physical Therapy (PT), Occupational Therapy (OT), 24 hour per day rehabilitation nursing, Therapeutic Recreaction (TR), Psychology, Neuropsychology, Care Coordinator, Rehabilitation Medicine, Nutrition Services, Pharmacy Services and Other  Weekly team conferences will be held on Tuesdays to discuss your progress.  Your Inpatient Rehabilitation Care Coordinator will talk with you frequently to get your input and to update you on team discussions.  Team conferences with you and your family in attendance may also be held.  Expected length of stay: 14-16 days    Overall anticipated outcome: Supervision  Depending on your progress and recovery, your program may change. Your Inpatient Rehabilitation Care Coordinator will coordinate services and will keep you informed of any changes. Your Inpatient Rehabilitation Care Coordinator's name and contact numbers are listed  below.  The following services may also be recommended but are not provided by the [facility] will be made to provide these services after discharge if needed.  Arrangements include referral to agencies that provide these services.  Your insurance has been verified to be:  [facility]  Your primary doctor is:   [name]  Pertinent information will be shared with your doctor and your insurance company.  Inpatient Rehabilitation Care Coordinator:  Cathleen Corti [phone] or [phone]  Information discussed with and copy given to patient by: Rana Snare, 04/28/2020, 11:15 AM

## 2020-04-28 NOTE — Progress Notes (Signed)
Pts PVR bladder scan showed 959, In and Out cath performed and 950 ml removed. Pts urine had small amount of bloody sediment, that began to clog the catheter. Pt denies any pain or discomfort with urination. Provider will be notified during morning rounds.

## 2020-04-28 NOTE — Significant Event (Signed)
Hypoglycemic Event  CBG: 47  Treatment: 1 can of coke, graham crackers and breakfast tray on the way  Symptoms: asymptomatic   Follow-up CBG: Time: 0630 CBG Result: 65  Possible Reasons for Event: Pt needs AM snack   * pt eating more crackers and on the 2nd coke, waiting for tray. Pt remains asymptomatic at 0630.   Comments/MD notified: notified during morning rounds.     [name] Small

## 2020-04-28 NOTE — Progress Notes (Addendum)
[name] Small PHYSICAL MEDICINE & REHABILITATION PROGRESS NOTE   Subjective/Complaints: Much more alert today! Appreciate nephrology following. Wife at bedside- discussed his performance in therapy with her. Cr and Hgb trending upward.   ROS: denies pain, dysuria.  Objective:   No results found. Recent Labs    04/27/20 0545 04/28/20 0444  WBC 10.8* 8.5  HGB 7.3* 7.6*  HCT 22.4* 23.9*  PLT 298 359   Recent Labs    04/27/20 0545 04/28/20 0444  NA 135 136  K 4.8 4.3  CL 98 100  CO2 27 28  GLUCOSE 106* 56*  BUN 71* 68*  CREATININE 2.63* 2.30*  CALCIUM 8.1* 8.1*    Intake/Output Summary (Last 24 hours) at 04/28/2020 1244 Last data filed at 04/28/2020 0457 Gross per 24 hour  Intake 350 ml  Output 975 ml  Net -625 ml     Physical Exam: Vital Signs Blood pressure (!) 111/49, pulse 68, temperature 97.8 F (36.6 C), resp. rate 18, height 6\' 2"  (1.88 m), weight 99.1 kg, SpO2 92 %. Nursing note and vitals reviewed. Constitutional: No distress.  Slept thorough most of exam--aroused briefly to answer occasional question. . On 2 L oxygen per [place].   HENT:  Head: Normocephalic and atraumatic.  Right Ear: External ear normal.  Left Ear: External ear normal.  Nose: Nose normal.  Eyes: Right eye exhibits no discharge. Left eye exhibits no discharge.  Respiratory: Effort normal. No stridor. No respiratory distress.  GI: Normal appearance. He exhibits distension. There is no abdominal tenderness.  Slightly tight  Genitourinary:  No longer with condom cath.    Musculoskeletal:        General: Swelling present.     Cervical back: Normal range of motion.     Comments: Moderate edema left hip to knee. Mild TTP of left hip.  Left transmet amputation site well healed.   Neurological: He is alert.  Motor: B/l UE: 5/5 proximal to distal RLE: HF: 2+/5, KE 3/5, ADF 4+/5 LLE: HF 2-/5, KE 2+/5, ADF 3/5 Dysphonia HOH  Skin: Skin is warm and dry.  Healed left TMA  Psychiatric: His  affect is blunt. His speech is delayed. He is slowed. He has a flat affect. Lethargic.   Assessment/Plan: 1. Functional deficits secondary to deficits with mobility, endurance, self-care secondary to left periprosthetic femur fracture, which require 3+ hours per day of interdisciplinary therapy in a comprehensive inpatient rehab setting.  Physiatrist is providing close team supervision and 24 hour management of active medical problems listed below.  Physiatrist and rehab team continue to assess barriers to discharge/monitor patient progress toward functional and medical goals  Care Tool:  Bathing  Bathing activity did not occur: Refused Body parts bathed by patient: Right arm, Left arm, Chest, Abdomen, Face         Bathing assist Assist Level: Contact Guard/Touching assist     Upper Body Dressing/Undressing Upper body dressing   What is the patient wearing?: Pull over shirt    Upper body assist Assist Level: Minimal Assistance - Patient > 75%    Lower Body Dressing/Undressing Lower body dressing    Lower body dressing activity did not occur: Refused What is the patient wearing?: Hospital gown only     Lower body assist Assist for lower body dressing: Maximal Assistance - Patient 25 - 49%     Toileting Toileting    Toileting assist Assist for toileting: Maximal Assistance - Patient 25 - 49% Assistive Device Comment: Urinal   Transfers Chair/bed  transfer  Transfers assist     Chair/bed transfer assist level: Maximal Assistance - Patient 25 - 49% (slide board)     Locomotion Ambulation   Ambulation assist   Ambulation activity did not occur: Safety/medical concerns          Walk 10 feet activity   Assist  Walk 10 feet activity did not occur: Safety/medical concerns        Walk 50 feet activity   Assist Walk 50 feet with 2 turns activity did not occur: Safety/medical concerns         Walk 150 feet activity   Assist Walk 150 feet  activity did not occur: Safety/medical concerns         Walk 10 feet on uneven surface  activity   Assist Walk 10 feet on uneven surfaces activity did not occur: Safety/medical concerns         Wheelchair     Assist Will patient use wheelchair at discharge?: Yes Type of Wheelchair: Manual Wheelchair activity did not occur: Refused         Wheelchair 50 feet with 2 turns activity    Assist    Wheelchair 50 feet with 2 turns activity did not occur: Refused       Wheelchair 150 feet activity     Assist  Wheelchair 150 feet activity did not occur: Refused       Blood pressure (!) 111/49, pulse 68, temperature 97.8 F (36.6 C), resp. rate 18, height 6\' 2"  (1.88 m), weight 99.1 kg, SpO2 92 %.    Medical Problem List and Plan: 1.  Deficits with mobility, endurance, self-care secondary to left periprosthetic femur fracture.              -patient may not  shower             -ELOS/Goals: 10-14 days/Min A            -Continue CIR 2.  Antithrombotics: -DVT/anticoagulation:  Mechanical: Sequential compression devices, below knee Bilateral lower extremities Will check dopplers.             -antiplatelet therapy: N/A 3. Pain Management: Has been on hydrocodone 5 mg every 8 hours. Changed to PRN as sedating patient and it makes more sense for him to take before therapy sessions.   6/18: well controlled and less sedated by medication.              Monitor with increased exertion 4. Mood: LCSW to follow for evaluation and support.              -antipsychotic agents: N/a 5. Neuropsych: This patient is not fully capable of making decisions on his own behalf. 6. Skin/Wound Care: Monitor wound for healing.  7. Fluids/Electrolytes/Nutrition: Monitor I/O. CMP ordered for tomorrow AM. 8. HTN: Monitor BP tid- continue Norvasc.             Monitor with increased mobility.   6/18: hypotensive. Amlodipine stopped.  9. T2DM with hyperglycemia: Monitor BS ac/hs.  Lantus  increased to 15 units bid today. Now on Lantus bid with SSI for elevated BS.   6/18: CBG has been as low as 47. Decrease Lantus to 14U BID             Monitor with increased mobility.  10. COPD: Wean oxygen as tolerated. Continue Breo/Incruse.  11. Acute on chronic renal failure: Trending to baseline line? Will order PVRs to monitor voiding.  Creatinine trending upward on 6/17: Bolus 250cc  6/18: Down to 2.3 12. ABLA/Hematuria: Continues--will continue to monitor H/H. Off Plavix and Lovenox at this time.             Hgb trending downward on 6/17. Up to 7.6 on 6/18.  13. Constipation: last BM 6/15- change senna to senna-docusate BID.  LOS: 2 days A FACE TO FACE EVALUATION WAS PERFORMED  [name] Small 04/28/2020, 12:44 PM

## 2020-04-28 NOTE — Progress Notes (Signed)
Physical Therapy Session Note  Patient Details  Name: [name] Small MRN: [id number] Date of Birth: 1928-04-08  Today's Date: 04/28/2020 PT Individual Time: 0867-6195 PT Individual Time Calculation (min): 54 min   Short Term Goals: Week 1:  PT Short Term Goal 1 (Week 1): Pt will peform bed mobility with minA. PT Short Term Goal 2 (Week 1): Pt will perform sit to stand with minA. PT Short Term Goal 3 (Week 1): Pt will perform bed to chair transfer with minA. PT Short Term Goal 4 (Week 1): Pt will self propel WC 50' with supervision.  Skilled Therapeutic Interventions/Progress Updates:     Pt received seated in WC and agreeable to therapy. Reports 7/10 pain in L hip. PT provides rest breaks and repositioning as needed to manage pain. WC transport to gym for time management. Pt performs x3 reps of sit to stand in // bars with mod/maxA from PT. Pt is able to maintain static standing in // bars with CGA for ~1 minute each rep. Pt maintain R lateral lean during standing to avoid pain provocation in LLE. Pt requests to return to room after 3 stands.   Slideboard transfer from [facility] to bed with mod/maxA. Sit to supine with modA and pt performs supine slide toward [facility] with supervision for hand placement and bridging technique. Pt performs HEP while supine in bed with PT providing pt's wife with education on body mechanics and hand placement to assist. Pt performs 1x10 quad sets, glute sets, AAROM SAQs, hip abduction, heel slides, and SLRs. Pt left supine in bed with alarm intact and all needs within reach.  Therapy Documentation Precautions:  Precautions Precautions: Fall Precaution Comments: TDWB LLE Restrictions Weight Bearing Restrictions: Yes LLE Weight Bearing: Touchdown weight bearing Other Position/Activity Restrictions: Pt has difficulty maintaining weight bearing.    Therapy/Group: Individual Therapy  Breck Coons, PT, DPT 04/28/2020, 4:03 PM

## 2020-04-28 NOTE — Progress Notes (Signed)
Pt CBG at 115. Pt sitting up in bed eating breakfast at this time. Call light in reach.

## 2020-04-28 NOTE — Progress Notes (Signed)
[facility] ROUNDING NOTE   Subjective:   Is an 86 year old male with a significant history of diabetes hypertension COPD on home oxygen diffuse vascular disease that includes coronary arteries and peripheral vascular disease status post left transmetatarsal amputation 2017.  Is status post resection of bladder cancer 2012.  His stage III/IV chronic kidney disease follow-up with Dr. Graylon Gunning at [facility] with a creatinine that runs about 2 mg/dL.  He sustained a mechanical fall and required operative management for a periprosthetic proximal femur fracture 04/19/2020.  He developed postoperative acute kidney injury with creatinine increased to 2.7 mg/dL.  His urinalysis is negative for protein or cells.  His last renal ultrasound was negative for hydronephrosis 04/22/2020.  Blood pressure 111/49 pulse 68 temperature 97.8 O2 sats 92% 2 L nasal cannula  Sodium 136 potassium 4.3 CO2 28 chloride 100 BUN 68 creatinine 2.3 glucose 56 iron saturations 15% hemoglobin 7.6  Atorvastatin 10 mg daily calcium 500 mg daily Aranesp 150 mcg 04/28/2020 weekly Lexapro 5 mg daily, glargine 15 units twice daily, iron 1 daily multivitamins 1 daily Flomax 0.4 mg nightly, Lantus 15 units nightly  Urine output 950 cc 04/28/2020   Objective:  Vital signs in last 24 hours:  Temp:  [97.6 F (36.4 C)-97.9 F (36.6 C)] 97.8 F (36.6 C) (06/18 0400) Pulse Rate:  [65-68] 68 (06/18 0400) Resp:  [18] 18 (06/17 1942) BP: (111-129)/(49-56) 111/49 (06/18 0400) SpO2:  [92 %-99 %] 92 % (06/17 1942) Weight:  [99.1 kg] 99.1 kg (06/18 0400)  Weight change: -1.2 kg Filed Weights   04/27/20 0500 04/28/20 0400  Weight: 100.3 kg 99.1 kg    Intake/Output: I/O last 3 completed shifts: In: 470 [P.O.:470] Out: 1375 [Urine:1375]   Intake/Output this shift:  No intake/output data recorded.  General: Appears younger than age 13.  No acute distress HEENT: Pupils are equal round reactive to light,  extraocular motions are intact, mucous membranes are moist Neck: No JVD Heart: Regular rate and rhythm Lungs: Clear to auscultation bilaterally Abdomen: Soft, nontender, nondistended Extremities: Left leg with edema-side of hip fracture and operative repair, right leg no edema Skin: Warm and dry Neuro: Alert and nonfocal   Basic Metabolic Panel: Recent Labs  Lab 04/24/20 0252 04/24/20 0252 04/25/20 0340 04/25/20 0340 04/26/20 0831 04/27/20 0545 04/28/20 0444  NA 136  --  134*  --  135 135 136  K 5.8*  --  5.1  --  4.6 4.8 4.3  CL 102  --  99  --  98 98 100  CO2 25  --  27  --  28 27 28   GLUCOSE 263*  --  203*  --  129* 106* 56*  BUN 42*  --  44*  --  59* 71* 68*  CREATININE 1.98*  --  1.99*  --  2.17* 2.63* 2.30*  CALCIUM 7.7*   < > 7.9*   < > 8.3* 8.1* 8.1*   < > = values in this interval not displayed.    Liver Function Tests: Recent Labs  Lab 04/27/20 0545  AST 16  ALT 7  ALKPHOS 38  BILITOT 0.9  PROT 5.1*  ALBUMIN 2.2*   No results for input(s): LIPASE, AMYLASE in the last 168 hours. No results for input(s): AMMONIA in the last 168 hours.  CBC: Recent Labs  Lab 04/23/20 0915 04/23/20 0915 04/23/20 1646 04/24/20 0252 04/25/20 0340 04/27/20 0545 04/28/20 0444  WBC 9.0  --   --  9.3 10.0 10.8*  8.5  NEUTROABS  --   --   --   --   --  7.3 5.2  HGB 7.3*   < > 8.2* 8.0* 8.2* 7.3* 7.6*  HCT 22.3*   < > 25.5* 25.3* 25.7* 22.4* 23.9*  MCV 103.2*  --   --  102.8* 104.0* 100.9* 102.6*  PLT 217  --   --  237 268 298 359   < > = values in this interval not displayed.    Cardiac Enzymes: No results for input(s): CKTOTAL, CKMB, CKMBINDEX, TROPONINI in the last 168 hours.  BNP: Invalid input(s): POCBNP  CBG: Recent Labs  Lab 04/27/20 1622 04/27/20 2055 04/28/20 0610 04/28/20 0629 04/28/20 0652  GLUCAP 113* 101* 51* 65* 115*    Microbiology: Results for orders placed or performed during the hospital encounter of 04/18/20  Resp Panel by RT PCR (RSV,  Flu A&B, Covid) - Nasopharyngeal Swab     Status: None   Collection Time: 04/18/20 11:36 AM   Specimen: Nasopharyngeal Swab  Result Value Ref Range Status   SARS Coronavirus 2 by RT PCR NEGATIVE NEGATIVE Final    Comment: (NOTE) SARS-CoV-2 target nucleic acids are NOT DETECTED. The SARS-CoV-2 RNA is generally detectable in upper respiratoy specimens during the acute phase of infection. The lowest concentration of SARS-CoV-2 viral copies this assay can detect is 131 copies/mL. A negative result does not preclude SARS-Cov-2 infection and should not be used as the sole basis for treatment or other patient management decisions. A negative result may occur with  improper specimen collection/handling, submission of specimen other than nasopharyngeal swab, presence of viral mutation(s) within the areas targeted by this assay, and inadequate number of viral copies (<131 copies/mL). A negative result must be combined with clinical observations, patient history, and epidemiological information. The expected result is Negative. Fact Sheet for Patients:  PinkCheek.be Fact Sheet for Healthcare Providers:  GravelBags.it This test is not yet ap proved or cleared by the [place] FDA and  has been authorized for detection and/or diagnosis of SARS-CoV-2 by FDA under an Emergency Use Authorization (EUA). This EUA will remain  in effect (meaning this test can be used) for the duration of the COVID-19 declaration under Section 564(b)(1) of the Act, 21 U.S.C. section 360bbb-3(b)(1), unless the authorization is terminated or revoked sooner.    Influenza A by PCR NEGATIVE NEGATIVE Final   Influenza B by PCR NEGATIVE NEGATIVE Final    Comment: (NOTE) The Xpert Xpress SARS-CoV-2/FLU/RSV assay is intended as an aid in  the diagnosis of influenza from Nasopharyngeal swab specimens and  should not be used as a sole basis for treatment. Nasal  washings and  aspirates are unacceptable for Xpert Xpress SARS-CoV-2/FLU/RSV  testing. Fact Sheet for Patients: PinkCheek.be Fact Sheet for Healthcare Providers: GravelBags.it This test is not yet approved or cleared by the [place] FDA and  has been authorized for detection and/or diagnosis of SARS-CoV-2 by  FDA under an Emergency Use Authorization (EUA). This EUA will remain  in effect (meaning this test can be used) for the duration of the  Covid-19 declaration under Section 564(b)(1) of the Act, 21  U.S.C. section 360bbb-3(b)(1), unless the authorization is  terminated or revoked.    Respiratory Syncytial Virus by PCR NEGATIVE NEGATIVE Final    Comment: (NOTE) Fact Sheet for Patients: PinkCheek.be Fact Sheet for Healthcare Providers: GravelBags.it This test is not yet approved or cleared by the [place] FDA and  has been authorized for detection and/or diagnosis  of SARS-CoV-2 by  FDA under an Emergency Use Authorization (EUA). This EUA will remain  in effect (meaning this test can be used) for the duration of the  COVID-19 declaration under Section 564(b)(1) of the Act, 21 U.S.C.  section 360bbb-3(b)(1), unless the authorization is terminated or  revoked. Performed at [facility], [place] [address] [place], [place] [address]   MRSA PCR Screening     Status: None   Collection Time: 04/19/20  5:26 AM   Specimen: Nasal Mucosa; Nasopharyngeal  Result Value Ref Range Status   MRSA by PCR NEGATIVE NEGATIVE Final    Comment:        The GeneXpert MRSA Assay (FDA approved for NASAL specimens only), is one component of a comprehensive MRSA colonization surveillance program. It is not intended to diagnose MRSA infection nor to guide or monitor treatment for MRSA infections. Performed at [facility], [place] [address]  [place], [place] [address]     Coagulation Studies: No results for input(s): LABPROT, INR in the last 72 hours.  Urinalysis: Recent Labs    04/27/20 1617  COLORURINE YELLOW  LABSPEC 1.015  PHURINE 5.0  GLUCOSEU NEGATIVE  HGBUR LARGE*  BILIRUBINUR NEGATIVE  KETONESUR NEGATIVE  PROTEINUR NEGATIVE  NITRITE NEGATIVE  LEUKOCYTESUR TRACE*      Imaging: No results found.   Medications:    . atorvastatin  10 mg Oral Daily  . calcium carbonate  1 tablet Oral Q breakfast  . darbepoetin (ARANESP) injection - NON-DIALYSIS  150 mcg Subcutaneous Q Fri-1800  . escitalopram  5 mg Oral Daily  . feeding supplement (GLUCERNA SHAKE)  237 mL Oral BID WC  . fluticasone furoate-vilanterol  1 puff Inhalation Daily  . insulin aspart  0-5 Units Subcutaneous QHS  . insulin aspart  0-9 Units Subcutaneous TID WC  . insulin glargine  15 Units Subcutaneous BID  . iron polysaccharides  150 mg Oral QPC supper  . multivitamin with minerals  1 tablet Oral Daily  . Ensure Max Protein  11 oz Oral Daily  . senna-docusate  1 tablet Oral BID  . tamsulosin  0.4 mg Oral QPC supper  . umeclidinium bromide  1 puff Inhalation Daily   acetaminophen, alum & mag hydroxide-simeth, bisacodyl, diphenhydrAMINE, guaiFENesin-dextromethorphan, HYDROcodone-acetaminophen, lidocaine, melatonin, methocarbamol, polyethylene glycol, prochlorperazine **OR** prochlorperazine **OR** prochlorperazine, sodium phosphate, traZODone  Assessment/ Plan:  Assessment/Plan: 86 year old Hispanic male with known CKD at baseline-creatinine around 2.  He is now hospitalized with hip fracture status post operative repair 04/19/2020 now in CIR. 1.Renal-acute on chronic renal insufficiency now with improving renal function.  Urinalysis is bland.  Kidneys have not been reimaged.  The most glaring thing is that he has had some urinary retention requiring in and out catheterizations.   Flomax has been initiated at 0.4 mg nightly.  Given his age and  multiple comorbid conditions not sure he would be the best dialysis candidate. 2. Hypertension/volume  -blood pressure and volume status seems fine.  Seems to be tolerating Flomax 0.4 mg nightly.  Amlodipine has been discontinued. 3. Anemia  -hemoglobin very low in the setting of hip surgery.    Low iron saturations will replete with IV iron 4.  Diabetes mellitus.  Per primary service hypoglycemic event would recommend decreasing Lantus.    LOS: [place] @TODAY @7 :68 AM

## 2020-04-28 NOTE — Plan of Care (Signed)
  Problem: Consults Goal: RH GENERAL PATIENT EDUCATION Description: See Patient Education module for education specifics. Outcome: Progressing   Problem: RH BOWEL ELIMINATION Goal: RH STG MANAGE BOWEL WITH ASSISTANCE Description: STG Manage Bowel with Min Assistance. Outcome: Progressing   Problem: RH BLADDER ELIMINATION Goal: RH STG MANAGE BLADDER WITH ASSISTANCE Description: STG Manage Bladder With Min Assistance Outcome: Progressing   Problem: RH SKIN INTEGRITY Goal: RH STG SKIN FREE OF INFECTION/BREAKDOWN Description: No new breakdown with min assist  Outcome: Progressing Goal: RH STG ABLE TO PERFORM INCISION/WOUND CARE W/ASSISTANCE Description: STG Able To Perform Incision/Wound Care With [employer]. Outcome: Progressing   Problem: RH SAFETY Goal: RH STG ADHERE TO SAFETY PRECAUTIONS W/ASSISTANCE/DEVICE Description: STG Adhere to Safety Precautions With Min Assistance/Device. Outcome: Progressing   Problem: RH PAIN MANAGEMENT Goal: RH STG PAIN MANAGED AT OR BELOW PT'S PAIN GOAL Description: < 3 out of 10.  Outcome: Progressing

## 2020-04-28 NOTE — Progress Notes (Signed)
Occupational Therapy Session Note  Patient Details  Name: [name] Small MRN: [id number] Date of Birth: 09/17/1928  Today's Date: 04/28/2020 OT Individual Time: 5697-9480 and 1655-3748 OT Individual Time Calculation (min): 62 min and 62 min   Short Term Goals: Week 1:  OT Short Term Goal 1 (Week 1): Pt will perform slide board transfers to/from [facility] with Mod A while maintaining TDWB LLE OT Short Term Goal 2 (Week 1): Pt will perform clothing management via lateral leans on toilet with Mod A OT Short Term Goal 3 (Week 1): Pt will perform sit to stand while maintaining TDWB on LLE with Mod A OT Short Term Goal 4 (Week 1): Pt will perform LB dressing with Mod A using AE as needed  Skilled Therapeutic Interventions/Progress Updates:    Pt greeted at time of session in bed supine resting but arousable, daughter in room and wife later joined as well. Pt feeling much better today with no c/o of pain initially, mild grimacing during transfer. LB dressing performed at bed level d/t patient stated yesterday his genitals were uncomfortable on slide board, Max A to don pants this date with pt able to assist with rolling L and R with use of bed rails as therapist donned over hips. Supine to sitting up max A but once sitting pt had F/F- sitting balance. Pt insisted that he wanted to stand to transfer, so therapist assisted with sit to stand with Max A while supporting LLE, once standing pt unable to perform SPT and agreeable to slide board. Slide board transfer bed <> w/c with max A cues for forward weight shifting and bearing weight through BUEs to decrease risk of shearing force. Max A to reposition hips once in chair. UB bathing at sink level with CGA, grooming/shaving with supervision, UB dressing with Min A to fully bring down over back. Wife wants to help perform ADLs, education provided on performing family training on ADLs during OT sessions in the future as he progresses. Pt fatigued, transferred back to  bed via board and sit to supine with Max A to manage BLEs but pt able to assist with scooting. Pain significantly improved today. Pt supine in bed with call bell in reach, alarm on, all needs met.   Session 2: Pt greeted at time of session supine in bed agreeable to OT session (with encouragement) with wife in room. Bed mobility with cues to log roll and use bed rails, Max A to transition supine to sitting up with increased pain compared to morning but unable to give number. Slide board transfer bed > wheelchair with Max A with therapist assist to not bear weight through RLE. Pt with discomfort with LB clothing required readjustment d/t being "too tight" and pulling. Wheelchair push ups 2x5 with pt unable to fully clear buttocks from chair but did participate for triceps strengthening. Expressed need to use urinal, Max A for set up to get penis into urinal d/t difficulty maneuvering around clothing and brief. BUE strengthening with 4# dowel for bicep curls, chest press, overhead press, lateral leans, forward circles. Pt left up in chair with alarm on, call bell in reach, all needs met.   Therapy Documentation Precautions:  Precautions Precautions: Fall Precaution Comments: TDWB LLE Restrictions Weight Bearing Restrictions: Yes LLE Weight Bearing: Touchdown weight bearing Other Position/Activity Restrictions: Pt has difficulty maintaining weight bearing.    Therapy/Group: Individual Therapy  [name] 04/28/2020, 11:49 AM

## 2020-04-29 ENCOUNTER — Inpatient Hospital Stay (HOSPITAL_COMMUNITY): Payer: Medicare Other | Admitting: Occupational Therapy

## 2020-04-29 ENCOUNTER — Inpatient Hospital Stay (HOSPITAL_COMMUNITY): Payer: Medicare Other | Admitting: Physical Therapy

## 2020-04-29 LAB — GLUCOSE, CAPILLARY
Glucose-Capillary: 69 mg/dL — ABNORMAL LOW (ref 70–99)
Glucose-Capillary: 74 mg/dL (ref 70–99)
Glucose-Capillary: 102 mg/dL — ABNORMAL HIGH (ref 70–99)
Glucose-Capillary: 111 mg/dL — ABNORMAL HIGH (ref 70–99)
Glucose-Capillary: 144 mg/dL — ABNORMAL HIGH (ref 70–99)

## 2020-04-29 MED ORDER — INSULIN GLARGINE 100 UNIT/ML ~~LOC~~ SOLN
13.0000 [IU] | Freq: Two times a day (BID) | SUBCUTANEOUS | Status: DC
  Administered 2020-04-29 – 2020-05-07 (×16): 13 [IU] via SUBCUTANEOUS
  Filled 2020-04-29 (×18): qty 0.13

## 2020-04-29 NOTE — Progress Notes (Signed)
Physical Therapy Session Note  Patient Details  Name: [name] Small MRN: [id number] Date of Birth: [birth year]  Today's Date: 04/29/2020 PT Individual Time: 1637-1710 PT Individual Time Calculation (min): 33 min   Short Term Goals: Week 1:  PT Short Term Goal 1 (Week 1): Pt will peform bed mobility with minA. PT Short Term Goal 2 (Week 1): Pt will perform sit to stand with minA. PT Short Term Goal 3 (Week 1): Pt will perform bed to chair transfer with minA. PT Short Term Goal 4 (Week 1): Pt will self propel WC 50' with supervision.  Skilled Therapeutic Interventions/Progress Updates:   Pt received supine in bed, asleep. Pt aroused with Ease. PT assessed vitals in supine: 123/51. SpO2 100%. Pt agreeable to PT. Supine>sit transfer with mod assist for improved control of LLE and to bring trunk in to sitting. Once EOB PT assessed Vitals. 102/44 SpO2 96%. Mild dizziness reported by Pt. Vitals reassessed after 2 min  119/48 SpO2 93%. Pt performed BLE LAQ within available range. X 10 BLE. Scooting to [facility] with mod-max assist x 8 scoots; moderate cues for anterior weight shift, use of BUE and improved timing. Pt repeatedly asking to lie back down in bed. Sit>supine with mod assist for control of the LLE. Vitals reassessed in supine: 110/48, SpO2 93%. Pt remained on 2 L/min O2 throughout sessions. Left supine in bed with call bell in reach and all needs met.       Therapy Documentation Precautions:  Precautions Precautions: Fall Precaution Comments: TDWB LLE Restrictions Weight Bearing Restrictions: Yes LLE Weight Bearing: Touchdown weight bearing Other Position/Activity Restrictions: Pt has difficulty maintaining weight bearing. Vital Signs: Therapy Vitals Temp: 97.9 F (36.6 C) Pulse Rate: 65 Resp: 18 BP: (!) 109/48 Patient Position (if appropriate): Lying Oxygen Therapy SpO2: 98 % O2 Device: Nasal Cannula Pain: 7/10 L hip. Pt repositioned RN notified.    Therapy/Group:  Individual Therapy  Lorie Phenix 04/29/2020, 5:18 PM

## 2020-04-29 NOTE — Progress Notes (Signed)
[facility] PHYSICAL MEDICINE & REHABILITATION PROGRESS NOTE   Subjective/Complaints: Wife concerned about additional hypoglycemic event this morning. CBG last night was 199 but went to 69 this morning. Does appear a little more tired this morning and may be due to low blood sugar. Will decrease Lantus to 13U BID.  Wife reports skin tear on buttocks.   ROS: denies pain, dysuria.  Objective:   No results found. Recent Labs    04/27/20 0545 04/28/20 0444  WBC 10.8* 8.5  HGB 7.3* 7.6*  HCT 22.4* 23.9*  PLT 298 359   Recent Labs    04/27/20 0545 04/28/20 0444  NA 135 136  K 4.8 4.3  CL 98 100  CO2 27 28  GLUCOSE 106* 56*  BUN 71* 68*  CREATININE 2.63* 2.30*  CALCIUM 8.1* 8.1*    Intake/Output Summary (Last 24 hours) at 04/29/2020 0954 Last data filed at 04/29/2020 0530 Gross per 24 hour  Intake 350 ml  Output 700 ml  Net -350 ml     Physical Exam: Vital Signs Blood pressure (!) 121/52, pulse 67, temperature 97.7 F (36.5 C), temperature source Oral, resp. rate 16, height 6\' 2"  (1.88 m), weight 99.3 kg, SpO2 98 %. Nursing note and vitals reviewed. Constitutional: No distress.  Slept thorough most of exam--aroused briefly to answer occasional question. . On 2 L oxygen per [place].   HENT:  Head: Normocephalic and atraumatic.  Right Ear: External ear normal.  Left Ear: External ear normal.  Nose: Nose normal.  Eyes: Right eye exhibits no discharge. Left eye exhibits no discharge.  Respiratory: Effort normal. No stridor. No respiratory distress.  GI: Normal appearance. He exhibits distension. There is no abdominal tenderness.  Slightly tight  Genitourinary:  No longer with condom cath. Wearing diaper. Small amount of stool in rectum    Musculoskeletal:        General: Swelling present.     Cervical back: Normal range of motion.     Comments: Moderate edema left hip to knee. Mild TTP of left hip.  Left transmet amputation site well healed.   Neurological: He is alert.   Motor: B/l UE: 5/5 proximal to distal RLE: HF: 2+/5, KE 3/5, ADF 4+/5 LLE: HF 2-/5, KE 2+/5, ADF 3/5 Dysphonia HOH  Skin: Skin is warm and dry. Small tear on buttock- not open.  Healed left TMA  Psychiatric: His affect is blunt. His speech is delayed. He is slowed. He has a flat affect. Lethargic.   Assessment/Plan: 1. Functional deficits secondary to deficits with mobility, endurance, self-care secondary to left periprosthetic femur fracture, which require 3+ hours per day of interdisciplinary therapy in a comprehensive inpatient rehab setting.  Physiatrist is providing close team supervision and 24 hour management of active medical problems listed below.  Physiatrist and rehab team continue to assess barriers to discharge/monitor patient progress toward functional and medical goals  Care Tool:  Bathing  Bathing activity did not occur: Refused Body parts bathed by patient: Right arm, Left arm, Chest, Abdomen, Face         Bathing assist Assist Level: Contact Guard/Touching assist     Upper Body Dressing/Undressing Upper body dressing   What is the patient wearing?: Pull over shirt    Upper body assist Assist Level: Minimal Assistance - Patient > 75%    Lower Body Dressing/Undressing Lower body dressing    Lower body dressing activity did not occur: Refused What is the patient wearing?: Hospital gown only     Lower  body assist Assist for lower body dressing: Maximal Assistance - Patient 25 - 49%     Toileting Toileting    Toileting assist Assist for toileting: Maximal Assistance - Patient 25 - 49% Assistive Device Comment: Urinal   Transfers Chair/bed transfer  Transfers assist     Chair/bed transfer assist level: Maximal Assistance - Patient 25 - 49%     Locomotion Ambulation   Ambulation assist   Ambulation activity did not occur: Safety/medical concerns          Walk 10 feet activity   Assist  Walk 10 feet activity did not occur:  Safety/medical concerns        Walk 50 feet activity   Assist Walk 50 feet with 2 turns activity did not occur: Safety/medical concerns         Walk 150 feet activity   Assist Walk 150 feet activity did not occur: Safety/medical concerns         Walk 10 feet on uneven surface  activity   Assist Walk 10 feet on uneven surfaces activity did not occur: Safety/medical concerns         Wheelchair     Assist Will patient use wheelchair at discharge?: Yes Type of Wheelchair: Manual Wheelchair activity did not occur: Refused         Wheelchair 50 feet with 2 turns activity    Assist    Wheelchair 50 feet with 2 turns activity did not occur: Refused       Wheelchair 150 feet activity     Assist  Wheelchair 150 feet activity did not occur: Refused       Blood pressure (!) 121/52, pulse 67, temperature 97.7 F (36.5 C), temperature source Oral, resp. rate 16, height 6\' 2"  (1.88 m), weight 99.3 kg, SpO2 98 %.    Medical Problem List and Plan: 1.  Deficits with mobility, endurance, self-care secondary to left periprosthetic femur fracture.              -patient may not  shower             -ELOS/Goals: 10-14 days/Min A            -Continue CIR  -Needs to person assist for lower body dressing.  2.  Antithrombotics: -DVT/anticoagulation:  Mechanical: Sequential compression devices, below knee Bilateral lower extremities Will check dopplers.             -antiplatelet therapy: N/A 3. Pain Management: Has been on hydrocodone 5 mg every 8 hours. Changed to PRN as sedating patient and it makes more sense for him to take before therapy sessions.   6/19: well controlled             Monitor with increased exertion 4. Mood: LCSW to follow for evaluation and support.              -antipsychotic agents: N/a 5. Neuropsych: This patient is not fully capable of making decisions on his own behalf. 6. Skin/Wound Care: Monitor wound for healing.   6/19: Small  skin tear on buttock- continue to monitor.  7. Fluids/Electrolytes/Nutrition: Monitor I/O.  8. HTN: Monitor BP tid- continue Norvasc.             Monitor with increased mobility.   6/18: hypotensive. Amlodipine stopped.   6/19: well controlled 9. T2DM with hyperglycemia: Monitor BS ac/hs.  Lantus increased to 15 units bid today. Now on Lantus bid with SSI for elevated BS.   6/18: CBG has  been as low as 47. Decrease Lantus to 14U BID  6/19: CBG down to 69 this morning. Decrease Lantus to 13U BID             Monitor with increased mobility.  10. COPD: Wean oxygen as tolerated. Continue Breo/Incruse.  11. Acute on chronic renal failure: Trending to baseline line? Will order PVRs to monitor voiding.             Creatinine trending upward on 6/17: Bolus 250cc  6/18: Down to 2.3 12. ABLA/Hematuria: Continues--will continue to monitor H/H. Off Plavix and Lovenox at this time.             Hgb trending downward on 6/17. Up to 7.6 on 6/18.  13. Constipation: last BM 6/15- change senna to senna-docusate BID.  LOS: 3 days A FACE TO FACE EVALUATION WAS PERFORMED  Martha Clan P Toi Stelly 04/29/2020, 9:54 AM

## 2020-04-29 NOTE — Plan of Care (Signed)
  Problem: Consults Goal: RH GENERAL PATIENT EDUCATION Description: See Patient Education module for education specifics. Outcome: Progressing   Problem: RH BOWEL ELIMINATION Goal: RH STG MANAGE BOWEL WITH ASSISTANCE Description: STG Manage Bowel with Min Assistance. Outcome: Progressing   Problem: RH BLADDER ELIMINATION Goal: RH STG MANAGE BLADDER WITH ASSISTANCE Description: STG Manage Bladder With Min Assistance Outcome: Progressing   Problem: RH SKIN INTEGRITY Goal: RH STG SKIN FREE OF INFECTION/BREAKDOWN Description: No new breakdown with min assist  Outcome: Progressing Goal: RH STG ABLE TO PERFORM INCISION/WOUND CARE W/ASSISTANCE Description: STG Able To Perform Incision/Wound Care With [employer]. Outcome: Progressing   Problem: RH SAFETY Goal: RH STG ADHERE TO SAFETY PRECAUTIONS W/ASSISTANCE/DEVICE Description: STG Adhere to Safety Precautions With Min Assistance/Device. Outcome: Progressing   Problem: RH PAIN MANAGEMENT Goal: RH STG PAIN MANAGED AT OR BELOW PT'S PAIN GOAL Description: < 3 out of 10.  Outcome: Progressing

## 2020-04-29 NOTE — IPOC Note (Signed)
Overall Plan of Care [facility]) Patient Details Name: [name] Small MRN: [id number] DOB: 1928-03-09  Admitting Diagnosis: Periprosthetic hip fracture  Hospital Problems: Principal Problem:   Periprosthetic hip fracture Active Problems:   Femur fracture, left (HCC)     Functional Problem List: Nursing Skin Integrity, Nutrition, Behavior, Medication Management, Bladder, Pain, Safety  PT Balance, Behavior, Endurance, Motor, Pain, Safety, Sensory  OT Balance, Behavior, Endurance, Motor, Pain, Safety  SLP    TR         Basic ADL's: OT Bathing, Grooming, Toileting, Dressing     Advanced  ADL's: OT       Transfers: PT Bed Mobility, Bed to Chair, Teacher, early years/pre, Tub/Shower     Locomotion: PT Ambulation, Emergency planning/management officer, Stairs     Additional Impairments: OT    SLP        TR      Anticipated Outcomes Item Anticipated Outcome  Self Feeding Independent  Swallowing      Basic self-care  Supervision overall but Min A for LB dressing (wife performed at [employer])  Insurance underwriter Transfers Supervision  Bowel/Bladder  Pt will manage bowel and bladder with min assist  Transfers  Supervision  Locomotion  Supervision with Wheelchair  Communication     Cognition     Pain  Pt will manage pain at 3 or less on a scale of 0-10.  Safety/Judgment  Pt will remain free of falls with injury while in rehab with min assist   Therapy Plan: PT Intensity: Minimum of 1-2 x/day ,45 to 90 minutes PT Frequency: 5 out of 7 days PT Duration Estimated Length of Stay: 14-16 days OT Intensity: Minimum of 1-2 x/day, 45 to 90 minutes OT Frequency: 5 out of 7 days OT Duration/Estimated Length of Stay: 14-16 days     Due to the current state of emergency, patients may not be receiving their 3-hours of Medicare-mandated therapy.   Team Interventions: Nursing Interventions Patient/Family Education, Bladder Management, Bowel Management, Medication Management, Pain  Management, Discharge Planning, Skin Care/Wound Management  PT interventions Ambulation/gait training, Community reintegration, DME/adaptive equipment instruction, Neuromuscular re-education, Psychosocial support, Stair training, UE/LE Strength taining/ROM, Wheelchair propulsion/positioning, Training and development officer, Discharge planning, Functional electrical stimulation, Pain management, Skin care/wound management, Therapeutic Activities, UE/LE Coordination activities, Cognitive remediation/compensation, Disease management/prevention, Patient/family education, Splinting/orthotics, Functional mobility training, Therapeutic Exercise, Visual/perceptual remediation/compensation  OT Interventions Balance/vestibular training, Discharge planning, Pain management, Self Care/advanced ADL retraining, Therapeutic Activities, Therapeutic Exercise, Patient/family education, Functional mobility training, Disease mangement/prevention, Community reintegration, Engineer, drilling, Neuromuscular re-education, Psychosocial support  SLP Interventions    TR Interventions    SW/CM Interventions Discharge Planning, Psychosocial Support, Patient/Family Education   Barriers to Discharge MD  Medical stability, Incontinence, Lack of/limited family support and Behavior  Nursing Medical stability    PT Home environment access/layout    OT Medical stability    SLP      SW       Team Discharge Planning: Destination: PT-Home ,OT- Home , SLP-  Projected Follow-up: PT-Home health PT, 24 hour supervision/assistance, OT-  Home health OT, SLP-  Projected Equipment Needs: PT-To be determined, OT- To be determined, SLP-  Equipment Details: PT- , OT-  Patient/family involved in discharge planning: PT- Patient, Family member/caregiver,  OT-Patient, Family member/caregiver, SLP-   MD ELOS: 10-14 days Medical Rehab Prognosis:  Excellent Assessment: [name] Small is an 86 year old man who is admitted to CIR with  deficits with mobility, endurance, self-care secondary to left periprosthetic femur  fracture. His rehab course has been complicated by lethargy, hypoglycemia, urinary retention, and constipation but he has been making gains. He had elevated creatinine and received fluid bolus which resulted in improvement in creatinine. Hydrocodone was changed to PRN to decrease lethargy. Hgb has been trending upward. Urinary retention is being treated with Flomax and constipation with laxatives.    See Team Conference Notes for weekly updates to the plan of care

## 2020-04-29 NOTE — Progress Notes (Signed)
Occupational Therapy Session Note  Patient Details  Name: [name] Small MRN: [id number] Date of Birth: [birth year]  Today's Date: 04/29/2020 OT Individual Time: 0017-4944  OT Individual Time Calculation (min): 74 min  60 minutes missed due to profound lethargy  Short Term Goals: Week 1:  OT Short Term Goal 1 (Week 1): Pt will perform slide board transfers to/from [facility] with Mod A while maintaining TDWB LLE OT Short Term Goal 2 (Week 1): Pt will perform clothing management via lateral leans on toilet with Mod A OT Short Term Goal 3 (Week 1): Pt will perform sit to stand while maintaining TDWB on LLE with Mod A OT Short Term Goal 4 (Week 1): Pt will perform LB dressing with Mod A using AE as needed  Skilled Therapeutic Interventions/Progress Updates:    Pt greeted in bed, very lethargic. Per spouse, [name] Small, who was present in the room, pt with minimal alertness since hypoglycemic event this morning. RN confirmed this also when arriving to bring in pain medicine. OT provided cold wash cloth to face and sternal rub, setting him up to eat a banana before taking medicine, alertness increased though he tended to just open one vs both eyes. Spouse assisted with providing encouragement for therapy participation afterwards. +2 for supine<sit with vcs. Once EOB, pt with Rt lateral lean/LOBs due to offweighting Lt hip. Per spouse, he does this to manage Lt LE pain. With Mod-Max A for sitting balance without UE support, he engaged in UB bathing/dressing tasks with vcs. Mod A for donning overhead shirt. Lateral scooting up in bed completed with Max A and spouse keeping Lt foot off of ground for WB precautions. Returned to supine for LB self care. Pt rolled Rt>Lt with Mod-Max A, assisting with rolling but limited by pain. Spouse actively assisting OT with perihygiene, brief change, and donning shorts bedlevel for hands on family education. Pt able to boost himself up in bed using the headboard after, did well with  maintaining TDWB of the Lt LE during this time. At end of session pt remained in bed with all needs within reach, and bed alarm set.   02 sats on 2L at start of session 98-100%, decreased to 87-88% with activity at times. Before OT departure, pt satting at 95% still on 2L.   2nd Session  Attempted to see pt for afternoon OT session with pt sleeping soundly with mouth open. RN hooking him up to IV and dtr at bedside. Both report that pt has been profoundly lethargic with minimal eye opening. OT tried to rouse pt but unsuccessful when using methods from AM session. Time missed due to lethargy.   Therapy Documentation Precautions:  Precautions Precautions: Fall Precaution Comments: TDWB LLE Restrictions Weight Bearing Restrictions: Yes LLE Weight Bearing: Touchdown weight bearing Other Position/Activity Restrictions: Pt has difficulty maintaining weight bearing. ADL: ADL Where Assessed-Upper Body Dressing: Chair Lower Body Dressing: Dependent Toileting: Dependent Toilet Transfer: Maximal assistance Toilet Transfer Method: Other (comment) (slide board with other rehab staff, [place] transfer)      Therapy/Group: Individual Therapy  [name] Small A [name] Small 04/29/2020, 12:23 PM

## 2020-04-29 NOTE — Progress Notes (Signed)
Hypoglycemic Event  CBG: 69  Treatment: 4 oz juice/soda  Symptoms: None  Follow-up CBG: Time:635 CBG Result:74  Possible Reasons for Event: Unknown  Comments/MD notified:Will monitor.    [place] Masaki, [employer]

## 2020-04-29 NOTE — Progress Notes (Signed)
[name] Small ROUNDING NOTE   Subjective:   Is an 86 year old male with a significant history of diabetes hypertension COPD on home oxygen diffuse vascular disease that includes coronary arteries and peripheral vascular disease status post left transmetatarsal amputation 2017.  Is status post resection of bladder cancer 2012.  His stage III/IV chronic kidney disease follow-up with Dr. Graylon Gunning at [facility] with a creatinine that runs about 2 mg/dL.  He sustained a mechanical fall and required operative management for a periprosthetic proximal femur fracture 04/19/2020.  He developed postoperative acute kidney injury with creatinine increased to 2.7 mg/dL.  His urinalysis is negative for protein or cells.  His last renal ultrasound was negative for hydronephrosis 04/22/2020.  Blood pressure 121/52 pulse 67 temperature 97.7 O2 sats 98% room air    Labs pending 04/29/2020  Atorvastatin 10 mg daily calcium 500 mg daily Aranesp 150 mcg 04/28/2020 weekly Lexapro 5 mg daily, glargine 15 units twice daily, iron 1 daily multivitamins 1 daily Flomax 0.4 mg nightly, Lantus 15 units nightly  Urine output 950 cc 04/28/2020   Objective:  Vital signs in last 24 hours:  Temp:  [97.5 F (36.4 C)-98.2 F (36.8 C)] 97.7 F (36.5 C) (06/19 0503) Pulse Rate:  [67-78] 67 (06/19 0503) Resp:  [16-19] 16 (06/19 0503) BP: (104-136)/(51-62) 121/52 (06/19 0503) SpO2:  [98 %-99 %] 98 % (06/19 0503) Weight:  [99.3 kg] 99.3 kg (06/19 0500)  Weight change: 0.2 kg Filed Weights   04/27/20 0500 04/28/20 0400 04/29/20 0500  Weight: 100.3 kg 99.1 kg 99.3 kg    Intake/Output: I/O last 3 completed shifts: In: 350 [P.O.:350] Out: 1650 [Urine:1650]   Intake/Output this shift:  No intake/output data recorded.  General: Appears younger than age 64.  No acute distress HEENT: Pupils are equal round reactive to light, extraocular motions are intact, mucous membranes are moist Neck: No  JVD Heart: Regular rate and rhythm Lungs: Clear to auscultation bilaterally Abdomen: Soft, nontender, nondistended Extremities: Left leg with edema-side of hip fracture and operative repair, right leg no edema Skin: Warm and dry Neuro: Alert and nonfocal   Basic Metabolic Panel: Recent Labs  Lab 04/24/20 0252 04/24/20 0252 04/25/20 0340 04/25/20 0340 04/26/20 0831 04/27/20 0545 04/28/20 0444  NA 136  --  134*  --  135 135 136  K 5.8*  --  5.1  --  4.6 4.8 4.3  CL 102  --  99  --  98 98 100  CO2 25  --  27  --  28 27 28   GLUCOSE 263*  --  203*  --  129* 106* 56*  BUN 42*  --  44*  --  59* 71* 68*  CREATININE 1.98*  --  1.99*  --  2.17* 2.63* 2.30*  CALCIUM 7.7*   < > 7.9*   < > 8.3* 8.1* 8.1*   < > = values in this interval not displayed.    Liver Function Tests: Recent Labs  Lab 04/27/20 0545  AST 16  ALT 7  ALKPHOS 38  BILITOT 0.9  PROT 5.1*  ALBUMIN 2.2*   No results for input(s): LIPASE, AMYLASE in the last 168 hours. No results for input(s): AMMONIA in the last 168 hours.  CBC: Recent Labs  Lab 04/23/20 0915 04/23/20 0915 04/23/20 1646 04/24/20 0252 04/25/20 0340 04/27/20 0545 04/28/20 0444  WBC 9.0  --   --  9.3 10.0 10.8* 8.5  NEUTROABS  --   --   --   --   --  7.3 5.2  HGB 7.3*   < > 8.2* 8.0* 8.2* 7.3* 7.6*  HCT 22.3*   < > 25.5* 25.3* 25.7* 22.4* 23.9*  MCV 103.2*  --   --  102.8* 104.0* 100.9* 102.6*  PLT 217  --   --  237 268 298 359   < > = values in this interval not displayed.    Cardiac Enzymes: No results for input(s): CKTOTAL, CKMB, CKMBINDEX, TROPONINI in the last 168 hours.  BNP: Invalid input(s): POCBNP  CBG: Recent Labs  Lab 04/28/20 1226 04/28/20 1711 04/28/20 2120 04/29/20 0613 04/29/20 0635  GLUCAP 140* 126* 196* 69* 74    Microbiology: Results for orders placed or performed during the hospital encounter of 04/26/20  Culture, Urine     Status: None   Collection Time: 04/27/20  3:33 PM   Specimen: Urine,  Catheterized  Result Value Ref Range Status   Specimen Description URINE, CATHETERIZED  Final   Special Requests NONE  Final   Culture   Final    NO GROWTH Performed at [facility], [place] [address] [place], [place] [address]    Report Status 04/28/2020 FINAL  Final    Coagulation Studies: No results for input(s): LABPROT, INR in the last 72 hours.  Urinalysis: Recent Labs    04/27/20 1617  COLORURINE YELLOW  LABSPEC 1.015  PHURINE 5.0  GLUCOSEU NEGATIVE  HGBUR LARGE*  BILIRUBINUR NEGATIVE  KETONESUR NEGATIVE  PROTEINUR NEGATIVE  NITRITE NEGATIVE  LEUKOCYTESUR TRACE*      Imaging: No results found.   Medications:   . ferric gluconate (FERRLECIT/NULECIT) IV Stopped (04/28/20 1343)   . atorvastatin  10 mg Oral Daily  . calcium carbonate  1 tablet Oral Q breakfast  . darbepoetin (ARANESP) injection - NON-DIALYSIS  150 mcg Subcutaneous Q Fri-1800  . escitalopram  5 mg Oral Daily  . feeding supplement (GLUCERNA SHAKE)  237 mL Oral BID WC  . fluticasone furoate-vilanterol  1 puff Inhalation Daily  . insulin aspart  0-5 Units Subcutaneous QHS  . insulin aspart  0-9 Units Subcutaneous TID WC  . insulin glargine  14 Units Subcutaneous BID  . iron polysaccharides  150 mg Oral QPC supper  . multivitamin with minerals  1 tablet Oral Daily  . Ensure Max Protein  11 oz Oral Daily  . senna-docusate  1 tablet Oral BID  . tamsulosin  0.4 mg Oral QPC supper  . umeclidinium bromide  1 puff Inhalation Daily   acetaminophen, alum & mag hydroxide-simeth, bisacodyl, diphenhydrAMINE, guaiFENesin-dextromethorphan, HYDROcodone-acetaminophen, lidocaine, melatonin, methocarbamol, polyethylene glycol, prochlorperazine **OR** prochlorperazine **OR** prochlorperazine, sodium phosphate, traZODone  Assessment/ Plan:  Assessment/Plan: 86 year old Hispanic male with known CKD at baseline-creatinine around 2.  He is now hospitalized with hip fracture status post operative repair 04/19/2020  now in CIR. 1.Renal-acute on chronic renal insufficiency now with improving renal function.  Urinalysis is bland.  Kidneys have not been reimaged.  The most glaring thing is that he has had some urinary retention requiring in and out catheterizations.   Flomax has been initiated at 0.4 mg nightly.  Given his age and multiple comorbid conditions not sure he would be the best dialysis candidate. 2. Hypertension/volume  -blood pressure and volume status seems fine.  Seems to be tolerating Flomax 0.4 mg nightly.  Amlodipine has been discontinued. 3. Anemia  -hemoglobin very low in the setting of hip surgery.    Low iron saturations will replete with IV iron 4.  Diabetes mellitus.  Per primary service  hypoglycemic event would recommend decreasing Lantus.  Follow-up on labs Baseline serum creatinine 2 mg/dL patient's renal function appears to be returning back to baseline euvolemic.  If labs are stable we will sign off on patient today.  Thank you for very interesting consult    LOS: [place] @TODAY @7 :46 AM

## 2020-04-30 LAB — URINALYSIS, ROUTINE W REFLEX MICROSCOPIC
Bilirubin Urine: NEGATIVE
Glucose, UA: >=500 mg/dL — AB
Ketones, ur: NEGATIVE mg/dL
Nitrite: POSITIVE — AB
Protein, ur: 100 mg/dL — AB
Specific Gravity, Urine: 1.011 (ref 1.005–1.030)
WBC, UA: >50 WBC/hpf — ABNORMAL HIGH (ref 0–5)
pH: 5 (ref 5.0–8.0)

## 2020-04-30 LAB — GLUCOSE, CAPILLARY
Glucose-Capillary: 276 mg/dL — ABNORMAL HIGH (ref 70–99)
Glucose-Capillary: 188 mg/dL — ABNORMAL HIGH (ref 70–99)
Glucose-Capillary: 199 mg/dL — ABNORMAL HIGH (ref 70–99)
Glucose-Capillary: 179 mg/dL — ABNORMAL HIGH (ref 70–99)

## 2020-04-30 MED ORDER — CEPHALEXIN 250 MG PO CAPS
500.0000 mg | ORAL_CAPSULE | Freq: Two times a day (BID) | ORAL | Status: DC
  Administered 2020-04-30 – 2020-05-02 (×5): 500 mg via ORAL
  Filled 2020-04-30 (×5): qty 2

## 2020-04-30 MED ORDER — DOCUSATE SODIUM 100 MG PO CAPS
100.0000 mg | ORAL_CAPSULE | Freq: Three times a day (TID) | ORAL | Status: DC
  Administered 2020-04-30 – 2020-05-12 (×35): 100 mg via ORAL
  Filled 2020-04-30 (×36): qty 1

## 2020-04-30 MED ORDER — SENNA 8.6 MG PO TABS
2.0000 | ORAL_TABLET | Freq: Every day | ORAL | Status: DC
  Administered 2020-04-30 – 2020-05-11 (×12): 17.2 mg via ORAL
  Filled 2020-04-30 (×12): qty 2

## 2020-04-30 NOTE — Progress Notes (Signed)
Patient coude cathed 2x this shift q6 with volumes 1400cc and 1000cc. Us/cs sent to lab per md order. Urine was cloudy, maldorous and was lightly red towards the end of cath.

## 2020-04-30 NOTE — Progress Notes (Signed)
[facility] PHYSICAL MEDICINE & REHABILITATION PROGRESS NOTE   Subjective/Complaints: Positive UTI- started Keflex- culture pending. Daughter and wife at bedside. Patient sleeping soundly.  Had BM after enema last night.  Wife would like to bring patient food from home. This usually fills him and he eats less during rest of the day. I advised this was permissible.   ROS: denies pain, dysuria.  Objective:   No results found. Recent Labs    04/28/20 0444  WBC 8.5  HGB 7.6*  HCT 23.9*  PLT 359   Recent Labs    04/28/20 0444  NA 136  K 4.3  CL 100  CO2 28  GLUCOSE 56*  BUN 68*  CREATININE 2.30*  CALCIUM 8.1*    Intake/Output Summary (Last 24 hours) at 04/30/2020 1234 Last data filed at 04/30/2020 1015 Gross per 24 hour  Intake 25 ml  Output 5300 ml  Net -5275 ml     Physical Exam: Vital Signs Blood pressure (!) 128/49, pulse 76, temperature 97.8 F (36.6 C), temperature source Oral, resp. rate 18, height 6\' 2"  (1.88 m), weight 99.4 kg, SpO2 95 %. Nursing note and vitals reviewed. Constitutional: No distress.  Slept through most of exam.  HENT:  Head: Normocephalic and atraumatic.  Right Ear: External ear normal.  Left Ear: External ear normal.  Nose: Nose normal.  Eyes: Right eye exhibits no discharge. Left eye exhibits no discharge.  Respiratory: Effort normal. No stridor. No respiratory distress.  GI: Normal appearance. He exhibits distension. There is no abdominal tenderness.  Slightly tight  Genitourinary:  No longer with condom cath. Wearing diaper. Small amount of stool in rectum    Musculoskeletal:        General: Swelling present.     Cervical back: Normal range of motion.     Comments: Moderate edema left hip to knee. Mild TTP of left hip.  Left transmet amputation site well healed.   Neurological: He is alert.  Motor: B/l UE: 5/5 proximal to distal RLE: HF: 2+/5, KE 3/5, ADF 4+/5 LLE: HF 2-/5, KE 2+/5, ADF 3/5 Dysphonia HOH  Skin: Skin is warm  and dry. Small tear on buttock- not open.  Healed left TMA Psychiatric: His affect is blunt. His speech is delayed. He is slowed. He has a flat affect. Lethargic.   Assessment/Plan: 1. Functional deficits secondary to deficits with mobility, endurance, self-care secondary to left periprosthetic femur fracture, which require 3+ hours per day of interdisciplinary therapy in a comprehensive inpatient rehab setting.  Physiatrist is providing close team supervision and 24 hour management of active medical problems listed below.  Physiatrist and rehab team continue to assess barriers to discharge/monitor patient progress toward functional and medical goals  Care Tool:  Bathing  Bathing activity did not occur: Refused Body parts bathed by patient: Right arm, Left arm, Chest, Abdomen, Face, Left upper leg, Right upper leg   Body parts bathed by helper: Front perineal area, Buttocks, Right lower leg, Left lower leg     Bathing assist Assist Level: 2 Helpers     Upper Body Dressing/Undressing Upper body dressing   What is the patient wearing?: Pull over shirt    Upper body assist Assist Level: Moderate Assistance - Patient 50 - 74%    Lower Body Dressing/Undressing Lower body dressing    Lower body dressing activity did not occur: Refused What is the patient wearing?: Incontinence brief, Pants     Lower body assist Assist for lower body dressing: 2 Helpers  Toileting Toileting    Toileting assist Assist for toileting: Total Assistance - Patient < 25% Assistive Device Comment: Urinal   Transfers Chair/bed transfer  Transfers assist     Chair/bed transfer assist level: Maximal Assistance - Patient 25 - 49%     Locomotion Ambulation   Ambulation assist   Ambulation activity did not occur: Safety/medical concerns          Walk 10 feet activity   Assist  Walk 10 feet activity did not occur: Safety/medical concerns        Walk 50 feet  activity   Assist Walk 50 feet with 2 turns activity did not occur: Safety/medical concerns         Walk 150 feet activity   Assist Walk 150 feet activity did not occur: Safety/medical concerns         Walk 10 feet on uneven surface  activity   Assist Walk 10 feet on uneven surfaces activity did not occur: Safety/medical concerns         Wheelchair     Assist Will patient use wheelchair at discharge?: Yes Type of Wheelchair: Manual Wheelchair activity did not occur: Refused         Wheelchair 50 feet with 2 turns activity    Assist    Wheelchair 50 feet with 2 turns activity did not occur: Refused       Wheelchair 150 feet activity     Assist  Wheelchair 150 feet activity did not occur: Refused       Blood pressure (!) 128/49, pulse 76, temperature 97.8 F (36.6 C), temperature source Oral, resp. rate 18, height 6\' 2"  (1.88 m), weight 99.4 kg, SpO2 95 %.    Medical Problem List and Plan: 1.  Deficits with mobility, endurance, self-care secondary to left periprosthetic femur fracture.              -patient may not  shower             -ELOS/Goals: 10-14 days/Min A            -Continue CIR  -Needs to person assist for lower body dressing.  2.  Antithrombotics: -DVT/anticoagulation:  Mechanical: Sequential compression devices, below knee Bilateral lower extremities Will check dopplers.             -antiplatelet therapy: N/A 3. Pain Management: Has been on hydrocodone 5 mg every 8 hours. Changed to PRN as sedating patient and it makes more sense for him to take before therapy sessions.   6/20 well controlled.             Monitor with increased exertion 4. Mood: LCSW to follow for evaluation and support.              -antipsychotic agents: N/a 5. Neuropsych: This patient is not fully capable of making decisions on his own behalf. 6. Skin/Wound Care: Monitor wound for healing.   6/19: Small skin tear on buttock- continue to monitor.  7.  Fluids/Electrolytes/Nutrition: Monitor I/O.  8. HTN: Monitor BP tid- continue Norvasc.             Monitor with increased mobility.   6/18: hypotensive. Amlodipine stopped.   6/19: well controlled 9. T2DM with hyperglycemia: Monitor BS ac/hs.  Lantus increased to 15 units bid today. Now on Lantus bid with SSI for elevated BS.   6/18: CBG has been as low as 47. Decrease Lantus to 14U BID  6/19: CBG down to 69 this morning. Decrease  Lantus to 13U BID             Monitor with increased mobility.  10. COPD: Wean oxygen as tolerated. Continue Breo/Incruse.  11. Acute on chronic renal failure: Trending to baseline line? Will order PVRs to monitor voiding.             Creatinine trending upward on 6/17: Bolus 250cc  6/18: Down to 2.3 12. ABLA/Hematuria: Continues--will continue to monitor H/H. Off Plavix and Lovenox at this time.             Hgb trending downward on 6/17. Up to 7.6 on 6/18.  13. Constipation: Had BM 6/19 with enema. Increased laxatives to colace TID and Senna HS 14. UTI: started keflex. Culture pending.   LOS: 4 days A FACE TO FACE EVALUATION WAS PERFORMED  Izora Ribas 04/30/2020, 12:34 PM

## 2020-05-01 ENCOUNTER — Inpatient Hospital Stay (HOSPITAL_COMMUNITY): Payer: Medicare Other | Admitting: Occupational Therapy

## 2020-05-01 ENCOUNTER — Inpatient Hospital Stay (HOSPITAL_COMMUNITY): Payer: Medicare Other

## 2020-05-01 LAB — GLUCOSE, CAPILLARY
Glucose-Capillary: 140 mg/dL — ABNORMAL HIGH (ref 70–99)
Glucose-Capillary: 222 mg/dL — ABNORMAL HIGH (ref 70–99)
Glucose-Capillary: 255 mg/dL — ABNORMAL HIGH (ref 70–99)
Glucose-Capillary: 229 mg/dL — ABNORMAL HIGH (ref 70–99)

## 2020-05-01 LAB — BASIC METABOLIC PANEL
Anion gap: 10 (ref 5–15)
BUN: 55 mg/dL — ABNORMAL HIGH (ref 8–23)
CO2: 26 mmol/L (ref 22–32)
Calcium: 8.4 mg/dL — ABNORMAL LOW (ref 8.9–10.3)
Chloride: 98 mmol/L (ref 98–111)
Creatinine, Ser: 2.19 mg/dL — ABNORMAL HIGH (ref 0.61–1.24)
GFR calc Af Amer: 30 mL/min — ABNORMAL LOW (ref >60)
GFR calc non Af Amer: 26 mL/min — ABNORMAL LOW (ref >60)
Glucose, Bld: 173 mg/dL — ABNORMAL HIGH (ref 70–99)
Potassium: 5 mmol/L (ref 3.5–5.1)
Sodium: 134 mmol/L — ABNORMAL LOW (ref 135–145)

## 2020-05-01 NOTE — Progress Notes (Signed)
Occupational Therapy Session Note  Patient Details  Name: [name] Small MRN: [id number] Date of Birth: [birth year]  Today's Date: 05/01/2020 OT Individual Time: 1100-1156 OT Individual Time Calculation (min): 56 min    Short Term Goals: Week 1:  OT Short Term Goal 1 (Week 1): Pt will perform slide board transfers to/from [facility] with Mod A while maintaining TDWB LLE OT Short Term Goal 2 (Week 1): Pt will perform clothing management via lateral leans on toilet with Mod A OT Short Term Goal 3 (Week 1): Pt will perform sit to stand while maintaining TDWB on LLE with Mod A OT Short Term Goal 4 (Week 1): Pt will perform LB dressing with Mod A using AE as needed  Skilled Therapeutic Interventions/Progress Updates:    Patient seated in w/c, alert and ready for therapy.  He notes pain through left upper leg "6"  Completed SB transfer w/c to mat table with CGA/min A.  Provided ice pack during activity that he notes helps with pain control.  Completed UB/LB and trunk/core exercises in unsupported sitting with good tolerance.  Returned to w/c using SB CG/min A.  W/c to bed using SB CGA.  Sitting to supine position mod A for B LE management.  Bed alarm set and call bell in place at close of session.  Both patient and wife pleased with progress to date.    Therapy Documentation Precautions:  Precautions Precautions: Fall Precaution Comments: TDWB LLE Restrictions Weight Bearing Restrictions: Yes LLE Weight Bearing: Touchdown weight bearing Other Position/Activity Restrictions: Pt has difficulty maintaining weight bearing.  Therapy/Group: Individual Therapy  Carlos Levering 05/01/2020, 7:51 AM

## 2020-05-01 NOTE — Progress Notes (Signed)
Occupational Therapy Session Note  Patient Details  Name: [name] Small MRN: [id number] Date of Birth: [birth year]  Today's Date: 05/01/2020 OT Individual Time: 0900-1010 OT Individual Time Calculation (min): 70 min    Short Term Goals: Week 1:  OT Short Term Goal 1 (Week 1): Pt will perform slide board transfers to/from [facility] with Mod A while maintaining TDWB LLE OT Short Term Goal 2 (Week 1): Pt will perform clothing management via lateral leans on toilet with Mod A OT Short Term Goal 3 (Week 1): Pt will perform sit to stand while maintaining TDWB on LLE with Mod A OT Short Term Goal 4 (Week 1): Pt will perform LB dressing with Mod A using AE as needed  Skilled Therapeutic Interventions/Progress Updates:    Pt greeted at time of session sitting up in wheelchair, agreeable to OT session to work on morning ADL routine. Hair washing and grooming at sink level, assistance for washing hair but pt able to perform grooming, face washing, grooming at sink level with overall supervision. UB bathing at sink level supervision and dried off in the same manner. Donning shirt with Min A after nursing assistance to thread IV. Pt declined LB dressing saying he had just put on new pants but did agree to wash off BLEs at thighs, calves, and feet but Max A overall for LB bathing. Therapist assist to wash both feet and pt did attempt to don socks with figure four method but unable to fully bring RLE to cross L knee. Sit to stand at grab bar in bathroom with therapist assist to support LLE and pt Max A of 1 to stand but able to stand <10 seconds d/t pain in L hip and quickly returned to sitting. O2 sats monitored throughout session, 92% throughout. Declined further activity. Returned to room and alarm on, call bell in reach, all needs met.   Therapy Documentation Precautions:  Precautions Precautions: Fall Precaution Comments: TDWB LLE Restrictions Weight Bearing Restrictions: Yes LLE Weight Bearing: Touchdown  weight bearing Other Position/Activity Restrictions: Pt has difficulty maintaining weight bearing.    Therapy/Group: Individual Therapy  [name] 05/01/2020, 10:16 AM

## 2020-05-01 NOTE — Plan of Care (Signed)
  Problem: Consults Goal: RH GENERAL PATIENT EDUCATION Description: See Patient Education module for education specifics. Outcome: Progressing   Problem: RH BOWEL ELIMINATION Goal: RH STG MANAGE BOWEL WITH ASSISTANCE Description: STG Manage Bowel with Min Assistance. Outcome: Progressing   Problem: RH SKIN INTEGRITY Goal: RH STG SKIN FREE OF INFECTION/BREAKDOWN Description: No new breakdown with min assist  Outcome: Progressing Goal: RH STG ABLE TO PERFORM INCISION/WOUND CARE W/ASSISTANCE Description: STG Able To Perform Incision/Wound Care With [employer]. Outcome: Progressing   Problem: RH SAFETY Goal: RH STG ADHERE TO SAFETY PRECAUTIONS W/ASSISTANCE/DEVICE Description: STG Adhere to Safety Precautions With Min Assistance/Device. Outcome: Progressing   Problem: RH PAIN MANAGEMENT Goal: RH STG PAIN MANAGED AT OR BELOW PT'S PAIN GOAL Description: < 3 out of 10.  Outcome: Progressing   Problem: RH BLADDER ELIMINATION Goal: RH STG MANAGE BLADDER WITH ASSISTANCE Description: STG Manage Bladder With Min Assistance Outcome: Not Progressing  Patient continues to be straight cath for volume over 350cc of urine. Patient is unaware of flow of urine. Patient urine stream is minimal but continuous.

## 2020-05-01 NOTE — Progress Notes (Signed)
Patient ID: [name] Small, male   DOB: [birth year], 86 y.o.   MRN: [id number]   SW left message for pt [facility] Kellie Simmering [phone]. [phone] to inform on pt admission and discuss aide service for pt. Hours are T-F 8am-4:30pm/Sat 8am-12pm.   Loralee Pacas, MSW, Door Office: [phone] Cell: [phone] Fax: [phone]

## 2020-05-01 NOTE — Progress Notes (Signed)
Inpatient Rehabilitation Care Coordinator Assessment and Plan  Patient Details  Name: [name] Small MRN: [id number] Date of Birth: 1928/10/25  Today's Date: 05/01/2020  Problem List:  Patient Active Problem List   Diagnosis Date Noted  . Femur fracture, left ([place]) 04/26/2020  . Periprosthetic hip fracture 04/26/2020  . Acute kidney injury ([place])   . Closed left subtrochanteric femur fracture, initial encounter ([place])   . Acute blood loss anemia   . Gross hematuria   . Controlled type 2 diabetes mellitus with hyperglycemia, with long-term current use of insulin ([place])   . Benign essential HTN   . Post-operative pain   . Closed left hip fracture, initial encounter ([place]) 04/18/2020  . Anxiety 12/15/2019  . Laceration of right thumb without foreign body with damage to nail 12/15/2019  . Anemia 01/31/2017  . Hypoxia   . History of hip replacement 12/26/2016    Class: Chronic  . Status post lumbar laminectomy 12/26/2016    Class: Chronic  . S/p left hip fracture 12/26/2016  . Idiopathic chronic venous hypertension of left lower extremity with inflammation 11/07/2016  . Spinal stenosis of lumbar region with neurogenic claudication 10/18/2016  . Spinal stenosis of lumbar region 09/25/2016  . Fall 07/04/2016  . COPD (chronic obstructive pulmonary disease) ([place]) 06/10/2016  . Weakness 03/18/2016  . Critical lower limb ischemia 02/28/2016  . Peripheral arterial disease ([place]) 10/17/2015  . Edema 07/19/2015  . Claudication ([place]) 01/31/2015  . DOE (dyspnea on exertion) 01/31/2015  . Hyperlipidemia 01/31/2015  . HTN (hypertension) 01/31/2015  . Chronic diastolic CHF (congestive heart failure) ([place]) 01/21/2014  . Atrial fibrillation ([place]) 12/16/2012  . Hyperkalemia 11/27/2011  . Bladder cancer ([place]) 09/22/2011  . Leg cramps 08/28/2011  . Cough 05/26/2011  . CAD (coronary artery disease) of artery bypass graft 05/09/2011  . Dyspnea on exertion 04/19/2011  . PSORIASIS, SCALP  10/25/2008  . Chronic kidney disease, stage III (moderate) 12/04/2007  . Type 2 diabetes mellitus with diabetic polyneuropathy ([place]) 05/21/2007  . DIVERTICULOSIS, COLON 05/21/2007  . BPH (benign prostatic hyperplasia) 05/21/2007   Past Medical History:  Past Medical History:  Diagnosis Date  . (HFpEF) heart failure with preserved ejection fraction ([place])   . Arthritis   . Back pain    s/p lumbar injection 2014  . BENIGN PROSTATIC HYPERTROPHY 05/21/2007  . Bladder cancer ([place]) 10/2011  . CAD (coronary artery disease)    nonobstructive by cath 6/12:  mid LAD 30%, proximal obtuse marginal-2 30%, proximal RCA 20%, mid RCA 30-40%.  He had normal cardiac output and mildly elevated filling pressures but no significant pulmonary hypertension;   Echocardiogram in May 2012 demonstrated EF 50-55% and left atrial enlargement   . Cellulitis of left foot    Hospitalized in 2006  . Chronic kidney disease (CKD), stage III (moderate) 12/04/2007   FOLLOWED BY DR PATEL  . Chronic pain of lower extremity   . Complication of anesthesia    1 time bladder cancer surgery- 2012 , oxygen satruratioon dropped had to stay overnight, no problems since then.  Marland Kitchen COPD (chronic obstructive pulmonary disease) ([place])   . DIABETES MELLITUS, TYPE II 05/21/2007  . DIVERTICULOSIS, COLON 05/21/2007   pt denies  . Dyspnea    with exertion, patient mointors saturation  . Fatigue AGE-RELATED  . Gross hematuria    pt denies  . HYPERTENSION 05/21/2007  . Impaired hearing BILATERAL HEARING AIDS  . On home oxygen therapy    as needed  . PAD (peripheral artery disease) (  [place])    a. 02/2016 L foot nonhealing ulcer-->Periph angio: L pop 100 w/ reconstitution via extensive vollats to the prox-mid peroneal (only patent vessel BK)-->PTA of L Pop and peroneal; b. Ongoing LE ischemia 5 & 06/2016 req amputation of toes on L foot.  . Pneumonia    Hx  of   . Psoriasis ELBOWS  . Psoriasis   . PSORIASIS, SCALP 10/25/2008   Past Surgical  History:  Past Surgical History:  Procedure Laterality Date  . AMPUTATION Left 03/27/2016   Procedure: partial first AMPUTATION RAY; LEFT;  Surgeon: Trula Slade, DPM;  Location: [place];  Service: Podiatry;  Laterality: Left;  . AMPUTATION Left 06/12/2016   Procedure: TRANSMETATARSAL AMPUTATION LEFT FOOT;  Surgeon: Newt Minion, MD;  Location: [place];  Service: Orthopedics;  Laterality: Left;  . APPENDECTOMY  1941  . CARDIAC CATHETERIZATION  04-24-11/  DR Rogue Jury ARIDA   MILD NONOBSTRUCTIVE CAD, NORMA CARDIAC OUTPUT  . CARDIOVASCULAR STRESS TEST  2007  . CATARACT EXTRACTION W/ INTRAOCULAR LENS  IMPLANT, BILATERAL Bilateral ~ 2010  . CYSTOSCOPY  12/25/2011   Procedure: CYSTOSCOPY;  Surgeon: Molli Hazard, MD;  Location: [facility];  Service: Urology;  Laterality: N/A;  needs intubation and to be paralyzed   . CYSTOSCOPY W/ RETROGRADES  10/30/2011   Procedure: CYSTOSCOPY WITH RETROGRADE PYELOGRAM;  Surgeon: Molli Hazard, MD;  Location: [facility];  Service: Urology;  Laterality: Bilateral;  CYSTOSCOPY POSS TURBT BILATERAL RETROGRADE PYLEOGRAM   . CYSTOSCOPY W/ RETROGRADES  11/30/2012   Procedure: CYSTOSCOPY WITH RETROGRADE PYELOGRAM;  Surgeon: Molli Hazard, MD;  Location: [facility];  Service: Urology;  Laterality: Bilateral;  Flexible cystoscopy.  . CYSTOSCOPY WITH BIOPSY  11/30/2012   Procedure: CYSTOSCOPY WITH BIOPSY;  Surgeon: Molli Hazard, MD;  Location: [facility];  Service: Urology;  Laterality: N/A;  . INCISION AND DRAINAGE FOOT Left ~ 2005   LEFT FOOT DUE TO INFECTION FROM  NAIL PUNCTURE INJURY  . LUMBAR LAMINECTOMY/DECOMPRESSION MICRODISCECTOMY N/A 10/18/2016   Procedure: LEFT AND CENTRAL L4-5 LUMBAR LAMINECTOMY WITH RESECTION OF SYNOVIAL CYST;  Surgeon: Jessy Oto, MD;  Location: [place];  Service: Orthopedics;  Laterality: N/A;  . ORIF FEMUR FRACTURE Left 04/19/2020   Procedure: OPEN  REDUCTION INTERNAL FIXATION FEMORAL SHAFT FRACTURE;  Surgeon: Shona Needles, MD;  Location: [place];  Service: Orthopedics;  Laterality: Left;  . PENILE PROSTHESIS IMPLANT  1990s  . PERIPHERAL VASCULAR CATHETERIZATION N/A 02/14/2016   Procedure: Abdominal Aortogram w/Lower Extremity;  Surgeon: Wellington Hampshire, MD;  Location: [place] [facility];  Service: Cardiovascular;  Laterality: N/A;  . PERIPHERAL VASCULAR CATHETERIZATION Left 02/28/2016   Procedure: Peripheral Vascular Balloon Angioplasty;  Surgeon: Wellington Hampshire, MD;  Location: [place] [facility];  Service: Cardiovascular;  Laterality: Left;  left peroneal and popliteal artery  . [place]  . TOTAL HIP ARTHROPLASTY Left 12/27/2016   Procedure: LEFT TOTAL HIP ARTHROPLASTY ANTERIOR APPROACH;  Surgeon: Mcarthur Rossetti, MD;  Location: [facility];  Service: Orthopedics;  Laterality: Left;  . TRANSURETHRAL RESECTION OF BLADDER TUMOR  10/30/2011   Procedure: TRANSURETHRAL RESECTION OF BLADDER TUMOR (TURBT);  Surgeon: Molli Hazard, MD;  Location: [facility];  Service: Urology;  Laterality: N/A;  . TRANSURETHRAL RESECTION OF BLADDER TUMOR  12/25/2011   Procedure: TRANSURETHRAL RESECTION OF BLADDER TUMOR (TURBT);  Surgeon: Molli Hazard, MD;  Location: [facility];  Service:  Urology;  Laterality: N/A;  need long gyrus instruments   . VASECTOMY  1990s   Social History:  reports that he quit smoking about 51 years ago. His smoking use included cigarettes. He has a 44.00 pack-year smoking history. He has never used smokeless tobacco. He reports that he does not drink alcohol and does not use drugs.  Family / Support Systems Marital Status: Married How Long?: 22 years Patient Roles: Spouse, Parent Spouse/Significant Other: [name] (wife)9: [phone] Children: 4 adult children; wife has 2 adult children Other Supports: None reported Anticipated Caregiver:  wife Ability/Limitations of Caregiver: None reported Caregiver Availability: 24/7 Family Dynamics: Pt lives alone with his wife.  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 Education: high school Read: Yes Write: Yes Employment Status: Retired Date Retired/Disabled/Unemployed: 1994 Age Retired: 26 Public relations account executive Issues: Denies Guardian/Conservator: N/A   Abuse/Neglect Abuse/Neglect Assessment Can Be Completed: Yes Physical Abuse: Denies Verbal Abuse: Denies Sexual Abuse: Denies Exploitation of patient/patient's resources: Denies Self-Neglect: Denies  Emotional Status Pt's affect, behavior and adjustment status: Pt in good spirits Recent Psychosocial Issues: Denies Psychiatric History: Denies Substance Abuse History: Denies; admits he wuit smoking cigarettes 40+ yrs ago; occasional EtOH use  Patient / Family Perceptions, Expectations & Goals Pt/Family understanding of illness & functional limitations: Pt and pt wife have a general understanding of his care needs Premorbid pt/family roles/activities: Independent Anticipated changes in roles/activities/participation: Assistance with ADLs/IADLs Pt/family expectations/goals: Pt goal "whatever I can do to move my body so I can play golf."  [employer]: None Premorbid Home Care/DME Agencies: None Transportation available at discharge: wife  Discharge Planning Living Arrangements: Spouse/significant other Support Systems: Spouse/significant other Type of Residence: Private residence Insurance underwriter Resources: Multimedia programmer (specify) ([place] and [place]) Financial Resources: Social Security Financial Screen Referred: No Living Expenses: Own Money Management: Patient, Spouse Does the patient have any problems obtaining your medications?: No Care Coordinator Barriers to Discharge:  Decreased caregiver support, Lack of/limited family support Care Coordinator Anticipated Follow Up Needs: HH/OP Expected length of stay: 14-16 days  Clinical Impression SW met with pt and pt wife in room to introduce self, explain role, and discuss discharge process. Pt has HCPOA. Wife states will bring in forms. Pt is a veteran-[place] [phone]; [facility]- Dr. Domenica Fail PCP. Gets medications through [place] as well. Wife would like aide services for patient if possible.  DME: [employer] [facility]- home o2, w/c and elevated toilet seat.   Kimmberly Wisser A Maya Arcand 05/01/2020, 12:26 PM

## 2020-05-01 NOTE — Progress Notes (Signed)
[place] PHYSICAL MEDICINE & REHABILITATION PROGRESS NOTE   Subjective/Complaints:  Pt reports cleaned out yesterday/BM-  Pain OK- first morning it's not really bad.   Was urinating before got UTI.  Wasn't on O2 all the time at home- used intermittently.   >100k Klebsiella oxytoca.  In UCx- no sensitivities yet.   ROS:  Pt denies SOB, abd pain, CP, N/V/C/D, and vision changes  Objective:   No results found. No results for input(s): WBC, HGB, HCT, PLT in the last 72 hours. Recent Labs    05/01/20 0622  NA 134*  K 5.0  CL 98  CO2 26  GLUCOSE 173*  BUN 55*  CREATININE 2.19*  CALCIUM 8.4*    Intake/Output Summary (Last 24 hours) at 05/01/2020 1553 Last data filed at 05/01/2020 0634 Gross per 24 hour  Intake 120 ml  Output 1451 ml  Net -1331 ml     Physical Exam: Vital Signs Blood pressure (!) 127/56, pulse 71, temperature 97.6 F (36.4 C), resp. rate 18, height 6\' 2"  (1.88 m), weight 99.7 kg, SpO2 99 %. Nursing note and vitals reviewed. Constitutional: No distress. Awake, alert, appropriate, wife at bedside; laying in bed; O2 via [place], 2L, PT in room as well, NAD HENT:  Conjugate gaze CV: RRR Respiratory: CTA B/L- no W/R/R- good air movement- GI: Soft, NT, ND, (+)BS  Genitourinary:  no catheter    Musculoskeletal:        General: Swelling present.     Cervical back: Normal range of motion.     Comments: Moderate edema left hip to knee. Mild TTP of left hip.  Left transmet amputation site well healed.   Neurological: He is alert.  Motor: B/l UE: 5/5 proximal to distal RLE: HF: 2+/5, KE 3/5, ADF 4+/5 LLE: HF 2-/5, KE 2+/5, ADF 3/5 Dysphonia- harse voice HOH  Skin: Skin is warm and dry. Small tear on buttock- not open.  Healed left TMA Psychiatric: appropriate  Assessment/Plan: 1. Functional deficits secondary to deficits with mobility, endurance, self-care secondary to left periprosthetic femur fracture, which require 3+ hours per day of  interdisciplinary therapy in a comprehensive inpatient rehab setting.  Physiatrist is providing close team supervision and 24 hour management of active medical problems listed below.  Physiatrist and rehab team continue to assess barriers to discharge/monitor patient progress toward functional and medical goals  Care Tool:  Bathing  Bathing activity did not occur: Refused Body parts bathed by patient: Right arm, Left arm, Chest, Abdomen, Right upper leg, Left upper leg, Face   Body parts bathed by helper: Right lower leg, Left lower leg     Bathing assist Assist Level: Maximal Assistance - Patient 24 - 49%     Upper Body Dressing/Undressing Upper body dressing   What is the patient wearing?: Pull over shirt    Upper body assist Assist Level: Minimal Assistance - Patient > 75%    Lower Body Dressing/Undressing Lower body dressing    Lower body dressing activity did not occur: Refused What is the patient wearing?: Incontinence brief, Pants     Lower body assist Assist for lower body dressing: 2 Helpers     Toileting Toileting    Toileting assist Assist for toileting: Total Assistance - Patient < 25% Assistive Device Comment: Urinal   Transfers Chair/bed transfer  Transfers assist     Chair/bed transfer assist level: Minimal Assistance - Patient > 75%     Locomotion Ambulation   Ambulation assist   Ambulation activity did not  occur: Safety/medical concerns          Walk 10 feet activity   Assist  Walk 10 feet activity did not occur: Safety/medical concerns        Walk 50 feet activity   Assist Walk 50 feet with 2 turns activity did not occur: Safety/medical concerns         Walk 150 feet activity   Assist Walk 150 feet activity did not occur: Safety/medical concerns         Walk 10 feet on uneven surface  activity   Assist Walk 10 feet on uneven surfaces activity did not occur: Safety/medical concerns          Wheelchair     Assist Will patient use wheelchair at discharge?: Yes Type of Wheelchair: Manual Wheelchair activity did not occur: Refused  Wheelchair assist level: Minimal Assistance - Patient > 75% Max wheelchair distance: 100    Wheelchair 50 feet with 2 turns activity    Assist    Wheelchair 50 feet with 2 turns activity did not occur: Refused   Assist Level: Minimal Assistance - Patient > 75%   Wheelchair 150 feet activity     Assist  Wheelchair 150 feet activity did not occur: Safety/medical concerns       Blood pressure (!) 127/56, pulse 71, temperature 97.6 F (36.4 C), resp. rate 18, height 6\' 2"  (1.88 m), weight 99.7 kg, SpO2 99 %.    Medical Problem List and Plan: 1.  Deficits with mobility, endurance, self-care secondary to left periprosthetic femur fracture.              -patient may not  shower             -ELOS/Goals: 10-14 days/Min A            -Continue CIR  -Needs to person assist for lower body dressing.  2.  Antithrombotics: -DVT/anticoagulation:  Mechanical: Sequential compression devices, below knee Bilateral lower extremities Will check dopplers.             -antiplatelet therapy: N/A 3. Pain Management: Has been on hydrocodone 5 mg every 8 hours. Changed to PRN as sedating patient and it makes more sense for him to take before therapy sessions.   6/21- pain for the first time controlled per PT this AM- con't prn Norco             Monitor with increased exertion 4. Mood: LCSW to follow for evaluation and support.              -antipsychotic agents: N/a 5. Neuropsych: This patient is not fully capable of making decisions on his own behalf. 6. Skin/Wound Care: Monitor wound for healing.   6/19: Small skin tear on buttock- continue to monitor.  7. Fluids/Electrolytes/Nutrition: Monitor I/O.  8. HTN: Monitor BP tid- continue Norvasc.             Monitor with increased mobility.   6/18: hypotensive. Amlodipine stopped.   6/19: well  controlled 9. T2DM with hyperglycemia: Monitor BS ac/hs.  Lantus increased to 15 units bid today. Now on Lantus bid with SSI for elevated BS.   6/18: CBG has been as low as 47. Decrease Lantus to 14U BID  6/19: CBG down to 69 this morning. Decrease Lantus to 13U BID   CBG (last 3)  Recent Labs    04/30/20 2055 05/01/20 0613 05/01/20 1148  GLUCAP 179* 140* 222*    6/21- BGs 140-220- but when Lantus  higher, dropped BG's too low- will con't current Lantus dose.              Monitor with increased mobility.  10. COPD: Wean oxygen as tolerated. Continue Breo/Incruse.  11. Acute on chronic renal failure: Trending to baseline line? Will order PVRs to monitor voiding.             Creatinine trending upward on 6/17: Bolus 250cc  6/18: Down to 2.3  6/21- Cr 2.19- appears to be baseline around 2.19 and BUN 55 12. ABLA/Hematuria: Continues--will continue to monitor H/H. Off Plavix and Lovenox at this time.             Hgb trending downward on 6/17. Up to 7.6 on 6/18.   6/21- no CBC done this AM- will order 13. Constipation: Had BM 6/19 with enema. Increased laxatives to colace TID and Senna HS  6/21- cleaned out yesterday 14. UTI: started keflex. Culture pending.   6/21- Sensitivities pending- is Klebsiella Oxytoca >100k   LOS: 5 days A FACE TO FACE EVALUATION WAS PERFORMED  [name] Small 05/01/2020, 3:53 PM

## 2020-05-01 NOTE — Progress Notes (Signed)
Pt very frustrated with frequent I&O caths. Pt initially refusing. Pt instructed urinary retention. Pt finally agrees.

## 2020-05-01 NOTE — Progress Notes (Signed)
Physical Therapy Session Note  Patient Details  Name: [name] Small MRN: [id number] Date of Birth: [birth year]  Today's Date: 05/01/2020 PT Individual Time: 0800-0900 PT Individual Time Calculation (min): 60 min   Short Term Goals: Week 1:  PT Short Term Goal 1 (Week 1): Pt will peform bed mobility with minA. PT Short Term Goal 2 (Week 1): Pt will perform sit to stand with minA. PT Short Term Goal 3 (Week 1): Pt will perform bed to chair transfer with minA. PT Short Term Goal 4 (Week 1): Pt will self propel WC 50' with supervision. Week 2:    Week 3:     Skilled Therapeutic Interventions/Progress Updates:    PAIN 7/10 w/standing activity, treatment to tolerance, repositioning, rest as needed  Pt initially supine w/wife at bedside, agreeable to treatment.  States he had large BM yesterday and pain is better today.  Pt total assist for lower body dressing, Lifts RLE for therapist to thread pants, rolls w/mod to max assist for raising pants. LLE AAROM hip abd x 10, hip/knee flexion x 10 Supine to sit w/mod to max assist of 1, care w/LLE due to pain. SBT bed to wc w/set up, min assist, and cues for sequencing. Pt transported to gym for continued session.  STS in parallel bars using bars to pull up, verbal instruction for set up, ant wt shift, use of momentum strategy to facilitate transition, TDWB LLE.  02 sats 99% on 2L, HR 85 Repeated, second effort pt w/increased difficulty maintaining TDWB on LLE. Stood approx 30sec x 1, 10sec x1  Seated therex: LAQs (partial rom achieved) 2 x15 Hip IR/ER 2x15  Pt propelled wc 153ft w/cues for efficient stroke and occasional min assist to steer wc, turn x 2.  Transported remaining distance to room due to fatigue. Pt left oob in wc in handoff to OT for next session.  Much improved mobility today and w/pain but less limiting.  Standing is very difficulty for pt w/wbing restricitions.   Therapy Documentation Precautions:   Precautions Precautions: Fall Precaution Comments: TDWB LLE Restrictions Weight Bearing Restrictions: Yes LLE Weight Bearing: Touchdown weight bearing Other Position/Activity Restrictions: Pt has difficulty maintaining weight bearing.    Therapy/Group: Individual Therapy  Callie Fielding, [place] 05/01/2020, 12:48 PM

## 2020-05-02 ENCOUNTER — Inpatient Hospital Stay (HOSPITAL_COMMUNITY): Payer: Medicare Other

## 2020-05-02 ENCOUNTER — Inpatient Hospital Stay (HOSPITAL_COMMUNITY): Payer: Medicare Other | Admitting: Occupational Therapy

## 2020-05-02 ENCOUNTER — Ambulatory Visit: Payer: Medicare Other | Admitting: Family Medicine

## 2020-05-02 LAB — BASIC METABOLIC PANEL
Anion gap: 9 (ref 5–15)
BUN: 56 mg/dL — ABNORMAL HIGH (ref 8–23)
CO2: 28 mmol/L (ref 22–32)
Calcium: 8.2 mg/dL — ABNORMAL LOW (ref 8.9–10.3)
Chloride: 99 mmol/L (ref 98–111)
Creatinine, Ser: 2.29 mg/dL — ABNORMAL HIGH (ref 0.61–1.24)
GFR calc Af Amer: 28 mL/min — ABNORMAL LOW (ref >60)
GFR calc non Af Amer: 24 mL/min — ABNORMAL LOW (ref >60)
Glucose, Bld: 113 mg/dL — ABNORMAL HIGH (ref 70–99)
Potassium: 4.5 mmol/L (ref 3.5–5.1)
Sodium: 136 mmol/L (ref 135–145)

## 2020-05-02 LAB — CBC WITH DIFFERENTIAL/PLATELET
Abs Immature Granulocytes: 0 10*3/uL (ref 0.00–0.07)
Basophils Absolute: 0 10*3/uL (ref 0.0–0.1)
Basophils Relative: 0 %
Eosinophils Absolute: 0.2 10*3/uL (ref 0.0–0.5)
Eosinophils Relative: 1 %
HCT: 27.6 % — ABNORMAL LOW (ref 39.0–52.0)
Hemoglobin: 8.6 g/dL — ABNORMAL LOW (ref 13.0–17.0)
Lymphocytes Relative: 9 %
Lymphs Abs: 1.5 10*3/uL (ref 0.7–4.0)
MCH: 32.8 pg (ref 26.0–34.0)
MCHC: 31.2 g/dL (ref 30.0–36.0)
MCV: 105.3 fL — ABNORMAL HIGH (ref 80.0–100.0)
Monocytes Absolute: 2 10*3/uL — ABNORMAL HIGH (ref 0.1–1.0)
Monocytes Relative: 12 %
Neutro Abs: 12.9 10*3/uL — ABNORMAL HIGH (ref 1.7–7.7)
Neutrophils Relative %: 78 %
Platelets: 458 10*3/uL — ABNORMAL HIGH (ref 150–400)
RBC: 2.62 MIL/uL — ABNORMAL LOW (ref 4.22–5.81)
RDW: 14 % (ref 11.5–15.5)
WBC: 16.6 10*3/uL — ABNORMAL HIGH (ref 4.0–10.5)
nRBC: 0 /100 WBC
nRBC: 0.2 % (ref 0.0–0.2)

## 2020-05-02 LAB — URINE CULTURE: Culture: >=100000 — AB

## 2020-05-02 LAB — GLUCOSE, CAPILLARY
Glucose-Capillary: 112 mg/dL — ABNORMAL HIGH (ref 70–99)
Glucose-Capillary: 154 mg/dL — ABNORMAL HIGH (ref 70–99)
Glucose-Capillary: 233 mg/dL — ABNORMAL HIGH (ref 70–99)

## 2020-05-02 MED ORDER — HYDROCERIN EX CREA
TOPICAL_CREAM | Freq: Two times a day (BID) | CUTANEOUS | Status: DC
  Administered 2020-05-05 – 2020-05-10 (×4): 1 via TOPICAL
  Filled 2020-05-02 (×2): qty 113

## 2020-05-02 MED ORDER — SODIUM CHLORIDE 0.9 % IV SOLN
1.0000 g | INTRAVENOUS | Status: AC
  Administered 2020-05-02 – 2020-05-08 (×7): 1 g via INTRAVENOUS
  Filled 2020-05-02 (×2): qty 1
  Filled 2020-05-02 (×2): qty 10
  Filled 2020-05-02: qty 0.05
  Filled 2020-05-02: qty 1
  Filled 2020-05-02: qty 10

## 2020-05-02 MED ORDER — TAMSULOSIN HCL 0.4 MG PO CAPS
0.8000 mg | ORAL_CAPSULE | Freq: Every day | ORAL | Status: DC
  Administered 2020-05-02 – 2020-05-11 (×10): 0.8 mg via ORAL
  Filled 2020-05-02 (×12): qty 2

## 2020-05-02 NOTE — Patient Care Conference (Signed)
Inpatient RehabilitationTeam Conference and Plan of Care Update Date: 05/02/2020   Time: 2:07 PM    Patient Name: [name] Small      Medical Record Number: [id number]  Date of Birth: [birth year] Sex: Male         Room/Bed: 4W05C/4W05C-01 Payor Info: Payor: [employer] MEDICARE / Plan: [facility] MEDICARE / Product Type: *No Product type* /    Admit Date/Time:  04/26/2020  4:44 PM  Primary Diagnosis:  Periprosthetic hip fracture  Patient Active Problem List   Diagnosis Date Noted  . Femur fracture, left ([place]) 04/26/2020  . Periprosthetic hip fracture 04/26/2020  . Acute kidney injury ([place])   . Closed left subtrochanteric femur fracture, initial encounter ([place])   . Acute blood loss anemia   . Gross hematuria   . Controlled type 2 diabetes mellitus with hyperglycemia, with long-term current use of insulin ([place])   . Benign essential HTN   . Post-operative pain   . Closed left hip fracture, initial encounter ([place]) 04/18/2020  . Anxiety 12/15/2019  . Laceration of right thumb without foreign body with damage to nail 12/15/2019  . Anemia 01/31/2017  . Hypoxia   . History of hip replacement 12/26/2016  . Status post lumbar laminectomy 12/26/2016  . S/p left hip fracture 12/26/2016  . Idiopathic chronic venous hypertension of left lower extremity with inflammation 11/07/2016  . Spinal stenosis of lumbar region with neurogenic claudication 10/18/2016  . Spinal stenosis of lumbar region 09/25/2016  . Fall 07/04/2016  . COPD (chronic obstructive pulmonary disease) ([place]) 06/10/2016  . Weakness 03/18/2016  . Critical lower limb ischemia 02/28/2016  . Peripheral arterial disease ([place]) 10/17/2015  . Edema 07/19/2015  . Claudication ([place]) 01/31/2015  . DOE (dyspnea on exertion) 01/31/2015  . Hyperlipidemia 01/31/2015  . HTN (hypertension) 01/31/2015  . Chronic diastolic CHF (congestive heart failure) ([place]) 01/21/2014  . Atrial fibrillation ([place]) 12/16/2012  . Hyperkalemia 11/27/2011  .  Bladder cancer ([place]) 09/22/2011  . Leg cramps 08/28/2011  . Cough 05/26/2011  . CAD (coronary artery disease) of artery bypass graft 05/09/2011  . Dyspnea on exertion 04/19/2011  . PSORIASIS, SCALP 10/25/2008  . Chronic kidney disease, stage III (moderate) 12/04/2007  . Type 2 diabetes mellitus with diabetic polyneuropathy ([place]) 05/21/2007  . DIVERTICULOSIS, COLON 05/21/2007  . BPH (benign prostatic hyperplasia) 05/21/2007    Expected Discharge Date: Expected Discharge Date: 05/12/20  Team Members Present: Physician leading conference: Dr. Courtney Heys Care Coodinator Present: Loralee Pacas, LCSWA;Tatelyn Vanhecke Creig Hines, RN, BSN, CRRN Nurse Present: Renda Rolls, LPN PT Present: Tereasa Coop, PT OT Present: Lillia Corporal, OT PPS Coordinator present : Gunnar Fusi, SLP     Current Status/Progress Goal Weekly Team Focus  Bowel/Bladder   Pt incontinent of stool. Requiring bladder scans I&O caths every 4-6 hours  Pt will be continent of bowel and able to urinate without I&O  Assess bowel and bladder QS/PRN   Swallow/Nutrition/ Hydration             ADL's   UB bathing CGA, UB dressing Min, Max-total for LB bathing and dressing, slide board transfers bed <> w/c Min/CGA, Max A for sit to stand (limited by L hip pain & difficulty maintaining WB status)  Supervision except Min for LB dressing (wife helped PLOF)  transfers, sit to stands, attempt BSC transfer, LB ADLs   Mobility   maxA bed mobility, modA sit to stand in // bars, minA bed to [facility] transfer with slideboard, minA 100' with WC. Pain control and issue.  supervision to [place] for transfers, 100' with WC  bed mobility, transfers, BLE strength, standing for WB activites   Communication             Safety/Cognition/ Behavioral Observations            Pain   Pt requiring PRN hydrocodone  pain 3/10  Assess pain QS/PRN   Skin   Pt with right hip surgical foam dressing CDI  Skin will remain intact. Surgical wound will heal  Assess skin  QS/PRN    Rehab Goals Patient on target to meet rehab goals: Yes *See Care Plan and progress notes for long and short-term goals.     Barriers to Discharge  Current Status/Progress Possible Resolutions Date Resolved   Nursing                  PT                    OT                  SLP                Care Coordinator Decreased caregiver support;Lack of/limited family support              Discharge Planning/Teaching Needs:  D/c to home with his wife who will provide 24/7 care  Family education as recommended by therapy   Team Discussion:  Pt had fall this AM. Continent of Bowel. Requiring I&O cath q 4-6 hrs. Lower body BD at Max assist. Bed mobility at max assist. Going home with wife. Pain control is still limiting with therapy. Pt receives medications through the [place] system.  Revisions to Treatment Plan:  none    Medical Summary Current Status: fall this AM;- cannot feel to void sometimes- requiring i/o caths- incontinent after i/o cath- bowels OK Weekly Focus/Goal: OT- transferred to chair, went to R- fell on R side- standing better- min A ; LB max A; UB min A; SB transfers min A  Barriers to Discharge: Decreased family/caregiver support;New oxygen;Weight bearing restrictions;Wound care;Weight;Incontinence;Medical stability;Home enviroment access/layout;Behavior  Barriers to Discharge Comments: Cr elevated- going back up a little- at 2.29- will follow- on rocephin for UTI- d/c 7/2 Possible Resolutions to Barriers: PT- max A bed mob; mod A sit-stand; min A SB transfers; pain control an issue- poor motiviation-missed PT today 2 to fall   Continued Need for Acute Rehabilitation Level of Care: The patient requires daily medical management by a physician with specialized training in physical medicine and rehabilitation for the following reasons: Direction of a multidisciplinary physical rehabilitation program to maximize functional independence : Yes Medical management of patient  stability for increased activity during participation in an intensive rehabilitation regime.: Yes Analysis of laboratory values and/or radiology reports with any subsequent need for medication adjustment and/or medical intervention. : Yes   I attest that I was present, lead the team conference, and concur with the assessment and plan of the team.   Cristi Loron 05/02/2020, 2:07 PM

## 2020-05-02 NOTE — Progress Notes (Signed)
Physical Therapy Session Note  Patient Details  Name: WANE MOLLETT MRN: [id number] Date of Birth: May 16, 1928  Today's Date: 05/02/2020 PT Missed Time: 10 Minutes Missed Time Reason: Xray  Short Term Goals: Week 1:  PT Short Term Goal 1 (Week 1): Pt will peform bed mobility with minA. PT Short Term Goal 2 (Week 1): Pt will perform sit to stand with minA. PT Short Term Goal 3 (Week 1): Pt will perform bed to chair transfer with minA. PT Short Term Goal 4 (Week 1): Pt will self propel WC 50' with supervision.  Skilled Therapeutic Interventions/Progress Updates:     Pt off floor for x-ray. PT will follow up as appropriate.  Therapy Documentation Precautions:  Precautions Precautions: Fall Precaution Comments: TDWB LLE Restrictions Weight Bearing Restrictions: Yes LLE Weight Bearing: Touchdown weight bearing Other Position/Activity Restrictions: Pt has difficulty maintaining weight bearing.    Therapy/Group: Individual Therapy  Breck Coons, PT, DPT 05/02/2020, 3:46 PM

## 2020-05-02 NOTE — Progress Notes (Signed)
Physical Therapy Session Note  Patient Details  Name: [name] Small MRN: [id number] Date of Birth: 28-Nov-1927  Today's Date: 05/02/2020 PT Missed Time: 101 Minutes Missed Time Reason: Patient fatigue  Short Term Goals: Week 1:  PT Short Term Goal 1 (Week 1): Pt will peform bed mobility with minA. PT Short Term Goal 2 (Week 1): Pt will perform sit to stand with minA. PT Short Term Goal 3 (Week 1): Pt will perform bed to chair transfer with minA. PT Short Term Goal 4 (Week 1): Pt will self propel WC 50' with supervision.  Skilled Therapeutic Interventions/Progress Updates:     Pt supine in bed with eyes closed. Reportedly had fall with OT earlier this AM and requests to rest at this time. PT will follow up in PM.  Therapy Documentation Precautions:  Precautions Precautions: Fall Precaution Comments: TDWB LLE Restrictions Weight Bearing Restrictions: Yes LLE Weight Bearing: Touchdown weight bearing Other Position/Activity Restrictions: Pt has difficulty maintaining weight bearing.    Therapy/Group: Individual Therapy  Breck Coons, PT, DPT 05/02/2020, 12:27 PM

## 2020-05-02 NOTE — Progress Notes (Signed)
05/02/20 0920  What Happened  Was fall witnessed? Yes  Who witnessed fall? Jarrett Soho OT and wife  Patients activity before fall other (comment) (Standing and pivoting )  Point of contact other (comment) (Right side of body)  Was patient injured? Yes (Skin tears and pain to variuos sites)  Follow Up  MD notified Pam PA  Time MD notified [phone]  Family notified Yes - comment (wife was present during fall)  Time family notified 0920  Additional tests Yes-comment (X-ray to B/L hips)  Simple treatment Dressing (Rt elbow and wrist and Left wrist and Ice)  Progress note created (see row info) Yes  Adult Fall Risk Assessment  Risk Factor Category (scoring not indicated) Fall has occurred during this admission (document High fall risk)  Age 86  Fall History: Fall within 6 months prior to admission 0  Elimination; Bowel and/or Urine Incontinence 2  Elimination; Bowel and/or Urine Urgency/Frequency 0  Medications: includes PCA/Opiates, Anti-convulsants, Anti-hypertensives, Diuretics, Hypnotics, Laxatives, Sedatives, and Psychotropics 5  Patient Care Equipment 0  Mobility-Assistance 2  Mobility-Gait 2  Mobility-Sensory Deficit 0  Altered awareness of immediate physical environment 0  Impulsiveness 0  Lack of understanding of one's physical/cognitive limitations 0  Total Score 14  Patient Fall Risk Level High fall risk  Adult Fall Risk Interventions  Required Bundle Interventions *See Row Information* High fall risk - low, moderate, and high requirements implemented  Additional Interventions Family Supervision;Lap belt while in chair/wheelchair;PT/OT need assessed if change in mobility from baseline;Use of appropriate toileting equipment (bedpan, BSC, etc.)  Screening for Fall Injury Risk (To be completed on HIGH fall risk patients) - Assessing Need for Low Bed  Risk For Fall Injury- Low Bed Criteria 85 years or older  Will Implement Low Bed and Floor Mats Yes  Specialty Low Bed  Contraindicated Hip precautions  Pain Assessment  Pain Scale 0-10  Pain Score 9  Pain Type Other (Comment) (s/p fall)  Pain Location Other (Comment) (right side of the body)  Pain Orientation Right  Pain Descriptors / Indicators Aching  Pain Frequency Rarely  Pain Onset Other (Comment) (after fall)  Patients Stated Pain Goal 0  Pain Intervention(s) Medication (See eMAR)  Neurological  Neuro (WDL) X  Level of Consciousness Alert  Orientation Level Oriented X4  Cognition Follows commands  Speech Clear  R Hand Grip Moderate  L Hand Grip Moderate   RUE Motor Response Purposeful movement  RUE Sensation Full sensation;Pain  RUE Motor Strength 5  LUE Motor Response Purposeful movement  LUE Sensation Full sensation  LUE Motor Strength 5  RLE Motor Response Purposeful movement  RLE Sensation Pain;Full sensation  RLE Motor Strength 4  LLE Motor Response Purposeful movement  LLE Sensation Pain;Full sensation  LLE Motor Strength 4  Neuro symptoms relieved by Rest  Musculoskeletal  Musculoskeletal (WDL) X  Assistive Device Wheelchair;Sliding board  Generalized Weakness Yes  LLE Weight Bearing TWB  Musculoskeletal Details  RUE Full movement  LUE Full movement  RLE Full movement  LLE Limited movement  Integumentary  Integumentary (WDL) X  Skin Color Appropriate for ethnicity  Skin Condition Dry  Skin Integrity Amputation;MASD;Skin tear;Surgical Incision (see LDA)  Amputation Location Toe (Comment  which one) (all toes)  Amputation Location Orientation Left  Amputation Intervention Other (Comment) (assessed)  Moisture Associated Skin Damage Location Abdomen  Moisture Associated Skin Damage Orientation Lower;Other (Comment) (folds)  Skin Tear Location Arm;Elbow;Wrist  Skin Tear Location Orientation Right;Left  Skin Tear Intervention Cleansed;Foam  Skin Turgor Non-tenting

## 2020-05-02 NOTE — Progress Notes (Signed)
[facility] PHYSICAL MEDICINE & REHABILITATION PROGRESS NOTE   Subjective/Complaints:  Pt's wife wants pt's Flomax increased to 0.8 mg nightly-  Was cathed 3x yesterday- Still on 2L O2-  Said sutures to be removed tomorrow, which is 14 days.   Will change Keflex to Rocephin since WBC still 16k    ROS:   Pt denies SOB, abd pain, CP, N/V/C/D, and vision changes   Objective:   No results found. Recent Labs    05/02/20 0719  WBC 16.6*  HGB 8.6*  HCT 27.6*  PLT 458*   Recent Labs    05/01/20 0622 05/02/20 0719  NA 134* 136  K 5.0 4.5  CL 98 99  CO2 26 28  GLUCOSE 173* 113*  BUN 55* 56*  CREATININE 2.19* 2.29*  CALCIUM 8.4* 8.2*    Intake/Output Summary (Last 24 hours) at 05/02/2020 0856 Last data filed at 05/02/2020 0644 Gross per 24 hour  Intake 120 ml  Output 1050 ml  Net -930 ml     Physical Exam: Vital Signs Blood pressure (!) 110/50, pulse 60, temperature 98.4 F (36.9 C), resp. rate 16, height 6\' 2"  (1.88 m), weight 98.8 kg, SpO2 95 %. Nursing note and vitals reviewed. Constitutional: No distress. Wife in room; appropriate, in bed; on O2 2L, NAD HENT:  Conjugate gaze CV: RRR Respiratory: CTA B/L- no W/R/R- good air movement GI: Soft, NT, ND, (+)BS   Genitourinary:  no catheter    Musculoskeletal:        General: Swelling present.     Cervical back: Normal range of motion.     Comments: Moderate edema left hip to knee. Mild edema L hip- incision looks great. No drainage or erythema Left transmet amputation site well healed.   Neurological: He is alert.  Motor: B/l UE: 5/5 proximal to distal RLE: HF: 2+/5, KE 3/5, ADF 4+/5 LLE: HF 2-/5, KE 2+/5, ADF 3/5 Dysphonia- hoarse voice HOH  Skin: Skin is warm and dry. Small tear on buttock- not open.  Healed left TMA Psychiatric: appropriate  Assessment/Plan: 1. Functional deficits secondary to deficits with mobility, endurance, self-care secondary to left periprosthetic femur fracture, which  require 3+ hours per day of interdisciplinary therapy in a comprehensive inpatient rehab setting.  Physiatrist is providing close team supervision and 24 hour management of active medical problems listed below.  Physiatrist and rehab team continue to assess barriers to discharge/monitor patient progress toward functional and medical goals  Care Tool:  Bathing  Bathing activity did not occur: Refused Body parts bathed by patient: Right arm, Left arm, Chest, Abdomen, Right upper leg, Left upper leg, Face   Body parts bathed by helper: Right lower leg, Left lower leg     Bathing assist Assist Level: Maximal Assistance - Patient 24 - 49%     Upper Body Dressing/Undressing Upper body dressing   What is the patient wearing?: Pull over shirt    Upper body assist Assist Level: Minimal Assistance - Patient > 75%    Lower Body Dressing/Undressing Lower body dressing    Lower body dressing activity did not occur: Refused What is the patient wearing?: Incontinence brief, Pants     Lower body assist Assist for lower body dressing: 2 Helpers     Toileting Toileting    Toileting assist Assist for toileting: Total Assistance - Patient < 25% Assistive Device Comment: Urinal   Transfers Chair/bed transfer  Transfers assist     Chair/bed transfer assist level: Minimal Assistance - Patient > 75%  Locomotion Ambulation   Ambulation assist   Ambulation activity did not occur: Safety/medical concerns          Walk 10 feet activity   Assist  Walk 10 feet activity did not occur: Safety/medical concerns        Walk 50 feet activity   Assist Walk 50 feet with 2 turns activity did not occur: Safety/medical concerns         Walk 150 feet activity   Assist Walk 150 feet activity did not occur: Safety/medical concerns         Walk 10 feet on uneven surface  activity   Assist Walk 10 feet on uneven surfaces activity did not occur: Safety/medical  concerns         Wheelchair     Assist Will patient use wheelchair at discharge?: Yes Type of Wheelchair: Manual Wheelchair activity did not occur: Refused  Wheelchair assist level: Minimal Assistance - Patient > 75% Max wheelchair distance: 100    Wheelchair 50 feet with 2 turns activity    Assist    Wheelchair 50 feet with 2 turns activity did not occur: Refused   Assist Level: Minimal Assistance - Patient > 75%   Wheelchair 150 feet activity     Assist  Wheelchair 150 feet activity did not occur: Safety/medical concerns       Blood pressure (!) 110/50, pulse 60, temperature 98.4 F (36.9 C), resp. rate 16, height 6\' 2"  (1.88 m), weight 98.8 kg, SpO2 95 %.    Medical Problem List and Plan: 1.  Deficits with mobility, endurance, self-care secondary to left periprosthetic femur fracture.              -patient may not  shower             -ELOS/Goals: 10-14 days/Min A            -Continue CIR  -Needs to person assist for lower body dressing.  2.  Antithrombotics: -DVT/anticoagulation:  Mechanical: Sequential compression devices, below knee Bilateral lower extremities Will check dopplers.             -antiplatelet therapy: N/A 3. Pain Management: Has been on hydrocodone 5 mg every 8 hours. Changed to PRN as sedating patient and it makes more sense for him to take before therapy sessions.   6/21- pain for the first time controlled per PT this AM- con't prn Norco             Monitor with increased exertion 4. Mood: LCSW to follow for evaluation and support.              -antipsychotic agents: N/a 5. Neuropsych: This patient is not fully capable of making decisions on his own behalf. 6. Skin/Wound Care: Monitor wound for healing.   6/19: Small skin tear on buttock- continue to monitor.  7. Fluids/Electrolytes/Nutrition: Monitor I/O.  8. HTN: Monitor BP tid- continue Norvasc.             Monitor with increased mobility.   6/22- BP 110/50- con't regimen 9.  T2DM with hyperglycemia: Monitor BS ac/hs.  Lantus increased to 15 units bid today. Now on Lantus bid with SSI for elevated BS.   6/18: CBG has been as low as 47. Decrease Lantus to 14U BID  6/19: CBG down to 69 this morning. Decrease Lantus to 13U BID   CBG (last 3)  Recent Labs    05/01/20 1701 05/01/20 2028 05/02/20 0630  GLUCAP 255* 229* 112*  6/22- when on higher dose of Lantus, pt's BGs dropped.              Monitor with increased mobility.  10. COPD: Wean oxygen as tolerated. Continue Breo/Incruse.  11. Acute on chronic renal failure: Trending to baseline line? Will order PVRs to monitor voiding.             Creatinine trending upward on 6/17: Bolus 250cc  6/18: Down to 2.3  6/21- Cr 2.19- appears to be baseline around 2.19 and BUN 55  6/22- Cr 2.29- today- not sure if due to keflex- but is still on lower side  12. ABLA/Hematuria: Continues--will continue to monitor H/H. Off Plavix and Lovenox at this time.             Hgb trending downward on 6/17. Up to 7.6 on 6/18.   6/21- no CBC done this AM- will order 13. Constipation: Had BM 6/19 with enema. Increased laxatives to colace TID and Senna HS  6/21- cleaned out yesterday 14. UTI: started keflex. Culture pending.   6/21- Sensitivities pending- is Klebsiella Oxytoca >100k  6/22- will change to Rocephin since WBC stil 16.6k  LOS: 6 days A FACE TO FACE EVALUATION WAS PERFORMED  [name] Small 05/02/2020, 8:56 AM

## 2020-05-02 NOTE — Progress Notes (Signed)
Occupational Therapy Session Note  Patient Details  Name: [name] Small MRN: [id number] Date of Birth: 01-Jan-1928  Today's Date: 05/02/2020 OT Individual Time: 9798-9211 OT Individual Time Calculation (min): 38 min  OT missed time: 65 min overall between 2 sessions   Short Term Goals: Week 1:  OT Short Term Goal 1 (Week 1): Pt will perform slide board transfers to/from [facility] with Mod A while maintaining TDWB LLE OT Short Term Goal 2 (Week 1): Pt will perform clothing management via lateral leans on toilet with Mod A OT Short Term Goal 3 (Week 1): Pt will perform sit to stand while maintaining TDWB on LLE with Mod A OT Short Term Goal 4 (Week 1): Pt will perform LB dressing with Mod A using AE as needed  Skilled Therapeutic Interventions/Progress Updates:    Pt greeted at time of session supine in bed with wife present in room, sleepy but agreeable to OT session. Discussed with wife DME at home, pt already has manual w/c, RW, BSC (uses over toilet), walk in shower with seat, and a tub/shower but no bench. LB dressing performed at bed level for donning pants with Max-total A, pt able to assist with rolling L and R to use other hand to help pull over hips. Bed mobility tasks of rolling L and R multiple times to get pants over hips and to improve rolling for bed mobility, Max A for rolling. Supine to sitting up at EOB with Max A to manage LLE d/t pain and UB to an upright position. Once sitting, therapist demonstrated sequence for stand pivot transfer prior to attempt. Sit to stand from EOB with Min-Mod A, practiced static standing with pivoting on RLE only while therapist supported LLE for WB status. Pt able to pivot over to wheelchair on RLE and in process of sitting down sustained a fall to the right and nursing alerted. Therapist assisted nursing with patient handling via lift to return to bed.  Session 2: Attempted seeing the pt later in the day, lethargic and requested to defer therapy d/t  fatigue and pain. Awaiting Xray results.    Therapy Documentation Precautions:  Precautions Precautions: Fall Precaution Comments: TDWB LLE Restrictions Weight Bearing Restrictions: Yes LLE Weight Bearing: (P) Touchdown weight bearing Other Position/Activity Restrictions: Pt has difficulty maintaining weight bearing.    Therapy/Group: Individual Therapy  [name] 05/02/2020, 12:12 PM

## 2020-05-02 NOTE — Plan of Care (Signed)
  Problem: Consults Goal: RH GENERAL PATIENT EDUCATION Description: See Patient Education module for education specifics. Outcome: Progressing   Problem: RH BOWEL ELIMINATION Goal: RH STG MANAGE BOWEL WITH ASSISTANCE Description: STG Manage Bowel with Min Assistance. Outcome: Progressing   Problem: RH BLADDER ELIMINATION Goal: RH STG MANAGE BLADDER WITH ASSISTANCE Description: STG Manage Bladder With Min Assistance Outcome: Progressing   Problem: RH SKIN INTEGRITY Goal: RH STG SKIN FREE OF INFECTION/BREAKDOWN Description: No new breakdown with min assist  Outcome: Progressing Goal: RH STG ABLE TO PERFORM INCISION/WOUND CARE W/ASSISTANCE Description: STG Able To Perform Incision/Wound Care With [employer]. Outcome: Progressing   Problem: RH SAFETY Goal: RH STG ADHERE TO SAFETY PRECAUTIONS W/ASSISTANCE/DEVICE Description: STG Adhere to Safety Precautions With Min Assistance/Device. Outcome: Progressing   Problem: RH PAIN MANAGEMENT Goal: RH STG PAIN MANAGED AT OR BELOW PT'S PAIN GOAL Description: < 3 out of 10.  Outcome: Progressing

## 2020-05-03 ENCOUNTER — Inpatient Hospital Stay (HOSPITAL_COMMUNITY): Payer: Medicare Other | Admitting: Occupational Therapy

## 2020-05-03 ENCOUNTER — Inpatient Hospital Stay (HOSPITAL_COMMUNITY): Payer: Medicare Other

## 2020-05-03 LAB — GLUCOSE, CAPILLARY
Glucose-Capillary: 124 mg/dL — ABNORMAL HIGH (ref 70–99)
Glucose-Capillary: 185 mg/dL — ABNORMAL HIGH (ref 70–99)
Glucose-Capillary: 116 mg/dL — ABNORMAL HIGH (ref 70–99)
Glucose-Capillary: 189 mg/dL — ABNORMAL HIGH (ref 70–99)

## 2020-05-03 NOTE — Progress Notes (Signed)
Patient ID: [name] Small, male   DOB: September 23, 1928, 86 y.o.   MRN: [id number]  SW met with pt and pt wife in room to provide updates from team conference, and d/c date 05/12/2020. Pt wife aware SW waiting on follow-up from [place] with regard to aide services. SW will also inquire about ramp.   SW left message [facility] Kellie Simmering [phone]. [phone]) to inform on pt admission, d/c date and discuss aide service for pt. SW waiting on follow-up.  Loralee Pacas, MSW, [place] Office: [phone] Cell: [phone] Fax: [phone]

## 2020-05-03 NOTE — Progress Notes (Signed)
Occupational Therapy Session Note  Patient Details  Name: [name] Small MRN: [id number] Date of Birth: 1928-04-15  Today's Date: 05/03/2020 OT Individual Time: 6295-2841 and 3244-0102 OT Individual Time Calculation (min): 59 min and 24 min   Short Term Goals: Week 1:  OT Short Term Goal 1 (Week 1): Pt will perform slide board transfers to/from [facility] with Mod A while maintaining TDWB LLE OT Short Term Goal 2 (Week 1): Pt will perform clothing management via lateral leans on toilet with Mod A OT Short Term Goal 3 (Week 1): Pt will perform sit to stand while maintaining TDWB on LLE with Mod A OT Short Term Goal 4 (Week 1): Pt will perform LB dressing with Mod A using AE as needed  Skilled Therapeutic Interventions/Progress Updates:    Pt greeted at time of session reclined in bed in supine sleeping but easily aroused and participate in OT session. Rolling at bed level for brief change and LB dressing with Max A with pt able to use bed rails to assist. Wife present throughout session and did help with rolling and LB dressing at bed level for family ed. Supine to sitting up at EOB Min A today which is an improvement, assistance to manage RLE and bring UB to an upright position. Once sitting EOB, SB transfer bed > wheelchair with Mod A after set up with cues for forward weight shifting and therapist assist to manage RLE. UB bathing/dressing with CGA throughout, pt able to complete after set up. Therapist coordinated with nursing to get extender for O2 and portable O2 tank for later sessions. Collaboration with wife regarding getting ramp to front of house. Wheelchair pushups with pt able to lift buttocks higher this session for 2x10 to improve tricep strength and clearance during transfers. Pt up in chair with alarm on, call bell in reach, all needs met.   Session 2: Pt reclined in bed supine resting but agreeable to OT session. Supine to sitting up at EOB Min A and performed 3 sit to stands with Mod A  to come up to a standing position with bed elevated and therapist support under RLE for WB status. Pt able to progressively stand for longer periods of 10, 20, 25 seconds with BUE support on RW. O2 sats checked throughout and dropped to 86% but improved to 96% with rest break and pursed lip breathing. Lateral scoots to [facility] with Min A with pt able to bear weight through RLE to scoot to [facility]. Returned to supine with Mod A to manage BLEs. Alarm on, call bell in reach, all needs met.   Therapy Documentation Precautions:  Precautions Precautions: Fall Precaution Comments: TDWB LLE Restrictions Weight Bearing Restrictions: Yes LLE Weight Bearing: Touchdown weight bearing Other Position/Activity Restrictions: Pt has difficulty maintaining weight bearing.   Therapy/Group: Individual Therapy  [name] 05/03/2020, 12:08 PM

## 2020-05-03 NOTE — Progress Notes (Signed)
[place] PHYSICAL MEDICINE & REHABILITATION PROGRESS NOTE   Subjective/Complaints:  Pt still requiring caths- just increased Flomax last night.  When fell yesterday, has a new laceration of R elbow- LBM overnight-   Said feeling sore/aching due to fall yesterday.   ROS:   Pt denies SOB, abd pain, CP, N/V/C/D, and vision changes   Objective:   DG ELBOW COMPLETE RIGHT (3+VIEW)  Result Date: 05/02/2020 CLINICAL DATA:  Fall with elbow laceration EXAM: RIGHT ELBOW - COMPLETE 3+ VIEW COMPARISON:  None. FINDINGS: There is no evidence of fracture, dislocation, or joint effusion. There is no evidence of arthropathy or other focal bone abnormality. Soft tissues are unremarkable. IMPRESSION: Negative. Electronically Signed   By: Donavan Foil M.D.   On: 05/02/2020 15:49   DG HIP UNILAT WITH PELVIS 2-3 VIEWS RIGHT  Result Date: 05/02/2020 CLINICAL DATA:  [place] in therapy, postop right tender hip EXAM: DG HIP (WITH OR WITHOUT PELVIS) 2-3V RIGHT COMPARISON:  04/18/2020 FINDINGS: Left hip replacement. Partially visualized surgical plate and fixating screws in the left femur. Pubic symphysis and rami appear intact. The SI joints are non widened. No acute displaced fracture or malalignment. Moderate arthritis of the right hip. Penile prosthesis. IMPRESSION: Left hip replacement and postsurgical changes. Negative for acute osseous abnormality. Electronically Signed   By: Donavan Foil M.D.   On: 05/02/2020 15:54   DG FEMUR MIN 2 VIEWS LEFT  Result Date: 05/02/2020 CLINICAL DATA:  Postop EXAM: LEFT FEMUR 2 VIEWS COMPARISON:  04/19/2020 FINDINGS: Previous left hip replacement. Stable surgical plate and multiple screw fixation of left periprosthetic fracture. Proximal cerclage wires. Stable fracture alignment. No definitive osseous bridging. Vascular calcifications. Incompletely visualized penile prosthesis. IMPRESSION: Stable surgical plate and screw fixation of left periprosthetic femur fracture with  stable alignment and similar appearance of the hardware. Electronically Signed   By: Donavan Foil M.D.   On: 05/02/2020 15:52   Recent Labs    05/02/20 0719  WBC 16.6*  HGB 8.6*  HCT 27.6*  PLT 458*   Recent Labs    05/01/20 0622 05/02/20 0719  NA 134* 136  K 5.0 4.5  CL 98 99  CO2 26 28  GLUCOSE 173* 113*  BUN 55* 56*  CREATININE 2.19* 2.29*  CALCIUM 8.4* 8.2*    Intake/Output Summary (Last 24 hours) at 05/03/2020 0848 Last data filed at 05/02/2020 2156 Gross per 24 hour  Intake 100 ml  Output 1475 ml  Net -1375 ml     Physical Exam: Vital Signs Blood pressure (!) 109/52, pulse (!) 58, temperature 98.3 F (36.8 C), resp. rate 20, height 6\' 2"  (1.88 m), weight 99.4 kg, SpO2 93 %. Nursing note and vitals reviewed. Constitutional: wife in room; pt awake, laying in room; appropriate, wearing O2 by [place] 2L, NAD HENT:  Conjugate gaze CV: RRR Respiratory: CTA B/L- no W/R/R- good air movement GI: Soft, NT, ND, (+)BS   Genitourinary:  no catheter - is getting cathed   Musculoskeletal:        General: Swelling present.     Cervical back: Normal range of motion.     Comments: Moderate edema left hip to knee. Mild edema L hip- incision looks great. No drainage or erythema Left transmet amputation site well healed.   Neurological: He is alert.  Motor: B/l UE: 5/5 proximal to distal RLE: HF: 2+/5, KE 3/5, ADF 4+/5 LLE: HF 2-/5, KE 2+/5, ADF 3/5 Dysphonia- hoarse voice- slightly improved HOH  Skin: Skin is warm and dry. Small  tear on buttock- not open.  Healed left TMA- no change Psychiatric: appropriate  Assessment/Plan: 1. Functional deficits secondary to deficits with mobility, endurance, self-care secondary to left periprosthetic femur fracture, which require 3+ hours per day of interdisciplinary therapy in a comprehensive inpatient rehab setting.  Physiatrist is providing close team supervision and 24 hour management of active medical problems listed  below.  Physiatrist and rehab team continue to assess barriers to discharge/monitor patient progress toward functional and medical goals  Care Tool:  Bathing  Bathing activity did not occur: Refused Body parts bathed by patient: Right arm, Left arm, Chest, Abdomen, Right upper leg, Left upper leg, Face   Body parts bathed by helper: Right lower leg, Left lower leg     Bathing assist Assist Level: Maximal Assistance - Patient 24 - 49%     Upper Body Dressing/Undressing Upper body dressing   What is the patient wearing?: Pull over shirt    Upper body assist Assist Level: Minimal Assistance - Patient > 75%    Lower Body Dressing/Undressing Lower body dressing    Lower body dressing activity did not occur: Refused What is the patient wearing?: Incontinence brief, Pants     Lower body assist Assist for lower body dressing: 2 Helpers     Toileting Toileting    Toileting assist Assist for toileting: Total Assistance - Patient < 25% Assistive Device Comment: Urinal   Transfers Chair/bed transfer  Transfers assist     Chair/bed transfer assist level: Minimal Assistance - Patient > 75%     Locomotion Ambulation   Ambulation assist   Ambulation activity did not occur: Safety/medical concerns          Walk 10 feet activity   Assist  Walk 10 feet activity did not occur: Safety/medical concerns        Walk 50 feet activity   Assist Walk 50 feet with 2 turns activity did not occur: Safety/medical concerns         Walk 150 feet activity   Assist Walk 150 feet activity did not occur: Safety/medical concerns         Walk 10 feet on uneven surface  activity   Assist Walk 10 feet on uneven surfaces activity did not occur: Safety/medical concerns         Wheelchair     Assist Will patient use wheelchair at discharge?: Yes Type of Wheelchair: Manual Wheelchair activity did not occur: Refused  Wheelchair assist level: Minimal  Assistance - Patient > 75% Max wheelchair distance: 100    Wheelchair 50 feet with 2 turns activity    Assist    Wheelchair 50 feet with 2 turns activity did not occur: Refused   Assist Level: Minimal Assistance - Patient > 75%   Wheelchair 150 feet activity     Assist  Wheelchair 150 feet activity did not occur: Safety/medical concerns       Blood pressure (!) 109/52, pulse (!) 58, temperature 98.3 F (36.8 C), resp. rate 20, height 6\' 2"  (1.88 m), weight 99.4 kg, SpO2 93 %.    Medical Problem List and Plan: 1.  Deficits with mobility, endurance, self-care secondary to left periprosthetic femur fracture.              -patient may not  shower             -ELOS/Goals: 10-14 days/Min A            -Continue CIR  -Needs to person assist for lower  body dressing.  2.  Antithrombotics: -DVT/anticoagulation:  Mechanical: Sequential compression devices, below knee Bilateral lower extremities Will check dopplers.             -antiplatelet therapy: N/A 3. Pain Management: Has been on hydrocodone 5 mg every 8 hours. Changed to PRN as sedating patient and it makes more sense for him to take before therapy sessions.   6/21- pain for the first time controlled per PT this AM- con't prn Norco             Monitor with increased exertion 4. Mood: LCSW to follow for evaluation and support.              -antipsychotic agents: N/a 5. Neuropsych: This patient is not fully capable of making decisions on his own behalf. 6. Skin/Wound Care: Monitor wound for healing.   6/19: Small skin tear on buttock- continue to monitor.  7. Fluids/Electrolytes/Nutrition: Monitor I/O.  8. HTN: Monitor BP tid- continue Norvasc.             Monitor with increased mobility.   6/22- BP 110/50- con't regimen 9. T2DM with hyperglycemia: Monitor BS ac/hs.  Lantus increased to 15 units bid today. Now on Lantus bid with SSI for elevated BS.   6/18: CBG has been as low as 47. Decrease Lantus to 14U BID  6/19: CBG  down to 69 this morning. Decrease Lantus to 13U BID   CBG (last 3)  Recent Labs    05/02/20 1147 05/02/20 2049 05/03/20 0534  GLUCAP 154* 233* 124*   6/22- when on higher dose of Lantus, pt's BGs dropped.              Monitor with increased mobility. 6/23- improved BG control-con't regimen  10. COPD: Wean oxygen as tolerated. Continue Breo/Incruse.  11. Acute on chronic renal failure: Trending to baseline line? Will order PVRs to monitor voiding.             Creatinine trending upward on 6/17: Bolus 250cc  6/18: Down to 2.3  6/21- Cr 2.19- appears to be baseline around 2.19 and BUN 55  6/22- Cr 2.29- today- not sure if due to keflex- but is still on lower side  12. ABLA/Hematuria: Continues--will continue to monitor H/H. Off Plavix and Lovenox at this time.             Hgb trending downward on 6/17. Up to 7.6 on 6/18.   6/21- no CBC done this AM- will order 13. Constipation: Had BM 6/19 with enema. Increased laxatives to colace TID and Senna HS  6/21- cleaned out yesterday 14. UTI: started keflex. Culture pending.   6/21- Sensitivities pending- is Klebsiella Oxytoca >100k  6/22- will change to Rocephin since WBC stil 16.6k  6/23- labs tomorrow- still requiring cathing- increased flomax to max dose yesterday.     LOS: 7 days A FACE TO FACE EVALUATION WAS PERFORMED  Jiles Goya 05/03/2020, 8:48 AM

## 2020-05-03 NOTE — Plan of Care (Signed)
  Problem: Consults Goal: RH GENERAL PATIENT EDUCATION Description: See Patient Education module for education specifics. Outcome: Progressing   Problem: RH BOWEL ELIMINATION Goal: RH STG MANAGE BOWEL WITH ASSISTANCE Description: STG Manage Bowel with Min Assistance. Outcome: Progressing   Problem: RH BLADDER ELIMINATION Goal: RH STG MANAGE BLADDER WITH ASSISTANCE Description: STG Manage Bladder With Min Assistance Outcome: Progressing   Problem: RH SKIN INTEGRITY Goal: RH STG SKIN FREE OF INFECTION/BREAKDOWN Description: No new breakdown with min assist  Outcome: Progressing Goal: RH STG ABLE TO PERFORM INCISION/WOUND CARE W/ASSISTANCE Description: STG Able To Perform Incision/Wound Care With [employer]. Outcome: Progressing   Problem: RH SAFETY Goal: RH STG ADHERE TO SAFETY PRECAUTIONS W/ASSISTANCE/DEVICE Description: STG Adhere to Safety Precautions With Min Assistance/Device. Outcome: Progressing   Problem: RH PAIN MANAGEMENT Goal: RH STG PAIN MANAGED AT OR BELOW PT'S PAIN GOAL Description: < 3 out of 10.  Outcome: Progressing

## 2020-05-03 NOTE — Progress Notes (Signed)
Physical Therapy Session Note  Patient Details  Name: [name] Small MRN: [id number] Date of Birth: 1928-04-07  Today's Date: 05/03/2020 PT Individual Time: 4599-7741 PT Individual Time Calculation (min): 45 min  PT Missed Time: 15 mins (pain)  Short Term Goals: Week 1:  PT Short Term Goal 1 (Week 1): Pt will peform bed mobility with minA. PT Short Term Goal 2 (Week 1): Pt will perform sit to stand with minA. PT Short Term Goal 3 (Week 1): Pt will perform bed to chair transfer with minA. PT Short Term Goal 4 (Week 1): Pt will self propel WC 50' with supervision.  Skilled Therapeutic Interventions/Progress Updates:     Pt received supine in bed and agreeable to therapy. Reports soreness pain in LLE and R elbow. Number not provided. PT provides rest breaks as needed to manage pain. Supine to sit with minA, increased time, with verbal cues on hand placement and sequencing. Pt performs slideboard transfer from bed to [facility] with modA and poor placement of slideboard, requiring education on proper placement and PT providing setup. Pt's oxygen was disconnected from wall during transfer so pt on room air initially. Pulse ox reads 85% so 2L oxygen reapplied with sats returning to >90% with several seconds.  Pt self propels WC 100' with minA to manage RW during turns and verbal cues for propulsion technique. Pt performs lateral transfer to mat table with minA. Pt performs x2 sit to stand transfers from mat table with modA and PT manually positioning pt's RLE to prevent WB. Pt verbalizes significant increase in pain during stands and refuses to perform additional therapy at this time. Lateral transfer from mat>WC>bed with minA. Sit to supine with minA for management of LLE. Pt left supine in bed with alarm intact and all needs within reach. Misses 15 minutes of PT due to pain and refusal.  Therapy Documentation Precautions:  Precautions Precautions: Fall Precaution Comments: TDWB  LLE Restrictions Weight Bearing Restrictions: Yes LLE Weight Bearing: Touchdown weight bearing Other Position/Activity Restrictions: Pt has difficulty maintaining weight bearing.    Therapy/Group: Individual Therapy  Breck Coons, PT, DPT 05/03/2020, 3:50 PM

## 2020-05-03 NOTE — Progress Notes (Signed)
Occupational Therapy Session Note  Patient Details  Name: [name] Small MRN: [id number] Date of Birth: 1928/02/09  Today's Date: 05/03/2020 OT Individual Time: 1517-1600 OT Individual Time Calculation (min): 43 min    Short Term Goals: Week 1:  OT Short Term Goal 1 (Week 1): Pt will perform slide board transfers to/from [facility] with Mod A while maintaining TDWB LLE OT Short Term Goal 2 (Week 1): Pt will perform clothing management via lateral leans on toilet with Mod A OT Short Term Goal 3 (Week 1): Pt will perform sit to stand while maintaining TDWB on LLE with Mod A OT Short Term Goal 4 (Week 1): Pt will perform LB dressing with Mod A using AE as needed  Skilled Therapeutic Interventions/Progress Updates:    Upon entering the room, pt supine in bed and sleeping soundly with wife present in the room. Pt agreeable to OT intervention but does report fatigue and soreness. Supine >sit with  Min A to EOB for L LE. Pt sitting EOB with supervision this session and focus on B UE strengthening exercises with use of level 3 resistive theraband. OT demonstrated exercises and pt returning demonstrations with min cuing for proper technique. Pt performed 2 sets of 10 chest pulls, shoulder diagonals, shoulder flexion, bicep curls, and alternating punches with rest breaks as needed. Pt required cuing for pursed lip breathing. Pt returning to supine with min A for L LE and pt able to position self with bed rail and trendelenburg . Bed alarm activated and call bell within reach. Caregiver remains in room. All needs within reach.   Therapy Documentation Precautions:  Precautions Precautions: Fall Precaution Comments: TDWB LLE Restrictions Weight Bearing Restrictions: Yes LLE Weight Bearing: Touchdown weight bearing Other Position/Activity Restrictions: Pt has difficulty maintaining weight bearing. General: General PT Missed Treatment Reason: Pain Vital Signs: Therapy Vitals Temp: 97.9 F (36.6  C) Pulse Rate: 65 Resp: 16 BP: (!) 118/53 Patient Position (if appropriate): Lying Oxygen Therapy SpO2: 97 % O2 Device: Room Air Pain: Pain Assessment Pain Score: 2  ADL: ADL Where Assessed-Upper Body Dressing: Chair Lower Body Dressing: Dependent Toileting: Dependent Toilet Transfer: Maximal assistance Toilet Transfer Method: Other (comment) (slide board with other rehab staff, Stedy transfer)   Therapy/Group: Individual Therapy  Gypsy Decant 05/03/2020, 4:14 PM

## 2020-05-04 ENCOUNTER — Inpatient Hospital Stay (HOSPITAL_COMMUNITY): Payer: Medicare Other | Admitting: Occupational Therapy

## 2020-05-04 ENCOUNTER — Inpatient Hospital Stay (HOSPITAL_COMMUNITY): Payer: Medicare Other | Admitting: Physical Therapy

## 2020-05-04 LAB — CBC WITH DIFFERENTIAL/PLATELET
Abs Immature Granulocytes: 1.18 10*3/uL — ABNORMAL HIGH (ref 0.00–0.07)
Basophils Absolute: 0.2 10*3/uL — ABNORMAL HIGH (ref 0.0–0.1)
Basophils Relative: 1 %
Eosinophils Absolute: 0.5 10*3/uL (ref 0.0–0.5)
Eosinophils Relative: 4 %
HCT: 27.5 % — ABNORMAL LOW (ref 39.0–52.0)
Hemoglobin: 8.5 g/dL — ABNORMAL LOW (ref 13.0–17.0)
Immature Granulocytes: 8 %
Lymphocytes Relative: 12 %
Lymphs Abs: 1.8 10*3/uL (ref 0.7–4.0)
MCH: 32.8 pg (ref 26.0–34.0)
MCHC: 30.9 g/dL (ref 30.0–36.0)
MCV: 106.2 fL — ABNORMAL HIGH (ref 80.0–100.0)
Monocytes Absolute: 1.7 10*3/uL — ABNORMAL HIGH (ref 0.1–1.0)
Monocytes Relative: 12 %
Neutro Abs: 9.1 10*3/uL — ABNORMAL HIGH (ref 1.7–7.7)
Neutrophils Relative %: 63 %
Platelets: 481 10*3/uL — ABNORMAL HIGH (ref 150–400)
RBC: 2.59 MIL/uL — ABNORMAL LOW (ref 4.22–5.81)
RDW: 14.6 % (ref 11.5–15.5)
WBC: 14.5 10*3/uL — ABNORMAL HIGH (ref 4.0–10.5)
nRBC: 0.3 % — ABNORMAL HIGH (ref 0.0–0.2)

## 2020-05-04 LAB — GLUCOSE, CAPILLARY
Glucose-Capillary: 98 mg/dL (ref 70–99)
Glucose-Capillary: 151 mg/dL — ABNORMAL HIGH (ref 70–99)
Glucose-Capillary: 218 mg/dL — ABNORMAL HIGH (ref 70–99)
Glucose-Capillary: 218 mg/dL — ABNORMAL HIGH (ref 70–99)

## 2020-05-04 LAB — BASIC METABOLIC PANEL
Anion gap: 8 (ref 5–15)
BUN: 44 mg/dL — ABNORMAL HIGH (ref 8–23)
CO2: 30 mmol/L (ref 22–32)
Calcium: 8 mg/dL — ABNORMAL LOW (ref 8.9–10.3)
Chloride: 97 mmol/L — ABNORMAL LOW (ref 98–111)
Creatinine, Ser: 2.01 mg/dL — ABNORMAL HIGH (ref 0.61–1.24)
GFR calc Af Amer: 33 mL/min — ABNORMAL LOW (ref >60)
GFR calc non Af Amer: 29 mL/min — ABNORMAL LOW (ref >60)
Glucose, Bld: 116 mg/dL — ABNORMAL HIGH (ref 70–99)
Potassium: 4.8 mmol/L (ref 3.5–5.1)
Sodium: 135 mmol/L (ref 135–145)

## 2020-05-04 MED ORDER — TRAMADOL HCL 50 MG PO TABS
50.0000 mg | ORAL_TABLET | Freq: Four times a day (QID) | ORAL | Status: DC | PRN
  Administered 2020-05-04 – 2020-05-07 (×5): 50 mg via ORAL
  Filled 2020-05-04 (×5): qty 1

## 2020-05-04 MED ORDER — TRAMADOL HCL 50 MG PO TABS: 50.0000 mg | ORAL_TABLET | Freq: Four times a day (QID) | ORAL | Status: DC | PRN

## 2020-05-04 NOTE — Progress Notes (Signed)
Physical Therapy Session Note  Patient Details  Name: [name] Small MRN: [id number] Date of Birth: [birth year]  Today's Date: 05/04/2020 PT Individual Time: 5797-2820 PT Individual Time Calculation (min): 9 min   Short Term Goals: Week 1:  PT Short Term Goal 1 (Week 1): Pt will peform bed mobility with minA. PT Short Term Goal 2 (Week 1): Pt will perform sit to stand with minA. PT Short Term Goal 3 (Week 1): Pt will perform bed to chair transfer with minA. PT Short Term Goal 4 (Week 1): Pt will self propel WC 50' with supervision.  Skilled Therapeutic Interventions/Progress Updates:   Pt received supine in bed and agreeable to PT at bed level. Supine therex: SLR, heel slides, hip abduction, ankle DF/PF, SAQ, chest press, bicep curls. Each completed x 10-12 [place] with AAROM provided by PT for the LLE for pain management and to ensure TDWB maintained throughout therex. Level 3 tband for BUE exercies. Pt left in bed with daughter present at end of session.           Therapy Documentation Precautions:  Precautions Precautions: Fall Precaution Comments: TDWB LLE Restrictions Weight Bearing Restrictions: Yes LLE Weight Bearing: Touchdown weight bearing Other Position/Activity Restrictions: Pt has difficulty maintaining weight bearing.  Vital Signs: Therapy Vitals Temp: 98.2 F (36.8 C) Temp Source: Oral Pulse Rate: 70 Resp: 19 BP: 135/67 Patient Position (if appropriate): Lying Oxygen Therapy SpO2: 98 % O2 Device: Room Air Pain: Pain Assessment Pain Scale: Faces Pain Score: 6  Faces Pain Scale: Hurts little more Pain Type: Acute pain Pain Location: Leg Pain Orientation: Left Pain Descriptors / Indicators: Aching;Discomfort Pain Frequency: Intermittent Pain Onset: On-going Patients Stated Pain Goal: 0 Pain Intervention(s): Repositioned Multiple Pain Sites: No    Therapy/Group: Individual Therapy  Lorie Phenix 05/04/2020, 5:15 PM

## 2020-05-04 NOTE — Progress Notes (Signed)
Patient ID: [name] Small, male   DOB: [birth year], 86 y.o.   MRN: [id number]   SW returned call/leftmessage for [place] [phone] ext. [phone]/work cell: [phone]) to discuss pt admission, d/c date and discuss aide service for pt. SW waiting on follow-up.  Loralee Pacas, MSW, [place] Office: [phone] Cell: [phone] Fax: [phone]

## 2020-05-04 NOTE — Progress Notes (Signed)
Patient is restless most of the night due to pain in BLE. Patient reported pain level 9/10 and writer gave patient PRN pain medications and muscle relaxer. Follow up results, patient stated "nothing change" or it will make patient doze off and waken up again with pain. PRN NORCO given at 0325 for left leg pain. Patient sound asleep during follow up. Will continue to monitor patient.

## 2020-05-04 NOTE — Progress Notes (Signed)
Patient woken up by writer for bladder scan. Patient scan for 406 mL, also writer follow up patient if still in pain? Patient stated "I feel better". Writer and NT performed straight cath at 6:30 AM, output 500 mL. Peri care provided, patient went back to sleep.

## 2020-05-04 NOTE — Progress Notes (Signed)
Physical Therapy Session Note  Patient Details  Name: [name] Small MRN: [id number] Date of Birth: 08/01/1928  Today's Date: 05/04/2020 PT Individual Time: 1003-1030 PT Individual Time Calculation (min): 27 min   Short Term Goals: Week 1:  PT Short Term Goal 1 (Week 1): Pt will peform bed mobility with minA. PT Short Term Goal 2 (Week 1): Pt will perform sit to stand with minA. PT Short Term Goal 3 (Week 1): Pt will perform bed to chair transfer with minA. PT Short Term Goal 4 (Week 1): Pt will self propel WC 50' with supervision.  Skilled Therapeutic Interventions/Progress Updates:    Patient received asleep in bed today, spouse reports that he had a very bad night and was unable to really get any rest last night. Found to be completely soiled in liquid BM- able to roll side to side with ModA and PT performed pericare with totalA, although patient was able to assist in cleaning his front; able to don shorts with Mod-maxA and multiple rolls to get shorts over hips. From there worked on heel slides and SLRs L LE with ModA from PT. Left in bed with all needs met awaiting next therapist, bed alarm active.   Therapy Documentation Precautions:  Precautions Precautions: Fall Precaution Comments: TDWB LLE Restrictions Weight Bearing Restrictions: Yes LLE Weight Bearing: Touchdown weight bearing Other Position/Activity Restrictions: Pt has difficulty maintaining weight bearing. Pain: Pain Assessment Pain Scale: 0-10 Pain Score: 0-No pain    Therapy/Group: Individual Therapy   Windell Norfolk, DPT, PN1   Supplemental Physical Therapist [place]    Pager [phone] Acute Rehab Office [phone]    05/04/2020, 12:21 PM

## 2020-05-04 NOTE — Progress Notes (Signed)
[place] PHYSICAL MEDICINE & REHABILITATION PROGRESS NOTE   Subjective/Complaints:  Miserable last night- had to lay on L side- no BM last night- LBM 2 days ago- denies constipation-  Not eating much.    ROS:   Pt denies SOB, abd pain, CP, N/V/C/D, and vision changes  Objective:   DG ELBOW COMPLETE RIGHT (3+VIEW)  Result Date: 05/02/2020 CLINICAL DATA:  Fall with elbow laceration EXAM: RIGHT ELBOW - COMPLETE 3+ VIEW COMPARISON:  None. FINDINGS: There is no evidence of fracture, dislocation, or joint effusion. There is no evidence of arthropathy or other focal bone abnormality. Soft tissues are unremarkable. IMPRESSION: Negative. Electronically Signed   By: Donavan Foil M.D.   On: 05/02/2020 15:49   DG HIP UNILAT WITH PELVIS 2-3 VIEWS RIGHT  Result Date: 05/02/2020 CLINICAL DATA:  [place] in therapy, postop right tender hip EXAM: DG HIP (WITH OR WITHOUT PELVIS) 2-3V RIGHT COMPARISON:  04/18/2020 FINDINGS: Left hip replacement. Partially visualized surgical plate and fixating screws in the left femur. Pubic symphysis and rami appear intact. The SI joints are non widened. No acute displaced fracture or malalignment. Moderate arthritis of the right hip. Penile prosthesis. IMPRESSION: Left hip replacement and postsurgical changes. Negative for acute osseous abnormality. Electronically Signed   By: Donavan Foil M.D.   On: 05/02/2020 15:54   DG FEMUR MIN 2 VIEWS LEFT  Result Date: 05/02/2020 CLINICAL DATA:  Postop EXAM: LEFT FEMUR 2 VIEWS COMPARISON:  04/19/2020 FINDINGS: Previous left hip replacement. Stable surgical plate and multiple screw fixation of left periprosthetic fracture. Proximal cerclage wires. Stable fracture alignment. No definitive osseous bridging. Vascular calcifications. Incompletely visualized penile prosthesis. IMPRESSION: Stable surgical plate and screw fixation of left periprosthetic femur fracture with stable alignment and similar appearance of the hardware.  Electronically Signed   By: Donavan Foil M.D.   On: 05/02/2020 15:52   Recent Labs    05/02/20 0719 05/04/20 0517  WBC 16.6* 14.5*  HGB 8.6* 8.5*  HCT 27.6* 27.5*  PLT 458* 481*   Recent Labs    05/02/20 0719 05/04/20 0517  NA 136 135  K 4.5 4.8  CL 99 97*  CO2 28 30  GLUCOSE 113* 116*  BUN 56* 44*  CREATININE 2.29* 2.01*  CALCIUM 8.2* 8.0*    Intake/Output Summary (Last 24 hours) at 05/04/2020 1044 Last data filed at 05/04/2020 7939 Gross per 24 hour  Intake -  Output 2200 ml  Net -2200 ml     Physical Exam: Vital Signs Blood pressure (!) 108/49, pulse (!) 57, temperature 97.7 F (36.5 C), temperature source Oral, resp. rate 18, height 6\' 2"  (1.88 m), weight 99.8 kg, SpO2 97 %. Nursing note and vitals reviewed. Constitutional: along sleeping in room; appropriate when woke; wearing 2L O2- sats 97% on 2L, NAD HENT:  Conjugate gaze CV: borderline bradycardia- regular rhythm Respiratory: CTA B/L- no W/R/R- good air movement GI: Soft, NT, ND, (+)BS   Musculoskeletal:        General: Swelling present.     Cervical back: Normal range of motion.     Comments: Moderate edema left hip to knee. Mild edema L hip- incision looks great. No drainage or erythema Left transmet amputation site well healed.   Neurological: He is alert.  Motor: B/l UE: 5/5 proximal to distal RLE: HF: 2+/5, KE 3/5, ADF 4+/5 LLE: HF 2-/5, KE 2+/5, ADF 3/5 Dysphonia- hoarse voice- slightly improved HOH  Skin: Skin is warm and dry. Small tear on buttock- not open.  Healed  left TMA- no change Psychiatric: appropriate; c/o pain in L hip  Assessment/Plan: 1. Functional deficits secondary to deficits with mobility, endurance, self-care secondary to left periprosthetic femur fracture, which require 3+ hours per day of interdisciplinary therapy in a comprehensive inpatient rehab setting.  Physiatrist is providing close team supervision and 24 hour management of active medical problems listed  below.  Physiatrist and rehab team continue to assess barriers to discharge/monitor patient progress toward functional and medical goals  Care Tool:  Bathing  Bathing activity did not occur: Refused Body parts bathed by patient: Right arm, Left arm, Chest, Abdomen   Body parts bathed by helper: Right lower leg, Left lower leg     Bathing assist Assist Level: Maximal Assistance - Patient 24 - 49%     Upper Body Dressing/Undressing Upper body dressing   What is the patient wearing?: Pull over shirt    Upper body assist Assist Level: Contact Guard/Touching assist    Lower Body Dressing/Undressing Lower body dressing    Lower body dressing activity did not occur: Refused What is the patient wearing?: Incontinence brief, Pants     Lower body assist Assist for lower body dressing: 2 Helpers     Toileting Toileting    Toileting assist Assist for toileting: Total Assistance - Patient < 25% Assistive Device Comment: Urinal   Transfers Chair/bed transfer  Transfers assist     Chair/bed transfer assist level: Minimal Assistance - Patient > 75%     Locomotion Ambulation   Ambulation assist   Ambulation activity did not occur: Safety/medical concerns          Walk 10 feet activity   Assist  Walk 10 feet activity did not occur: Safety/medical concerns        Walk 50 feet activity   Assist Walk 50 feet with 2 turns activity did not occur: Safety/medical concerns         Walk 150 feet activity   Assist Walk 150 feet activity did not occur: Safety/medical concerns         Walk 10 feet on uneven surface  activity   Assist Walk 10 feet on uneven surfaces activity did not occur: Safety/medical concerns         Wheelchair     Assist Will patient use wheelchair at discharge?: Yes Type of Wheelchair: Manual Wheelchair activity did not occur: Refused  Wheelchair assist level: Minimal Assistance - Patient > 75% Max wheelchair distance:  100    Wheelchair 50 feet with 2 turns activity    Assist    Wheelchair 50 feet with 2 turns activity did not occur: Refused   Assist Level: Minimal Assistance - Patient > 75%   Wheelchair 150 feet activity     Assist  Wheelchair 150 feet activity did not occur: Safety/medical concerns       Blood pressure (!) 108/49, pulse (!) 57, temperature 97.7 F (36.5 C), temperature source Oral, resp. rate 18, height 6\' 2"  (1.88 m), weight 99.8 kg, SpO2 97 %.    Medical Problem List and Plan: 1.  Deficits with mobility, endurance, self-care secondary to left periprosthetic femur fracture.              -patient may not  shower             -ELOS/Goals: 10-14 days/Min A            -Continue CIR  -Needs to person assist for lower body dressing.  2.  Antithrombotics: -DVT/anticoagulation:  Mechanical:  Sequential compression devices, below knee Bilateral lower extremities Will check dopplers.             -antiplatelet therapy: N/A 3. Pain Management: Has been on hydrocodone 5 mg every 8 hours. Changed to PRN as sedating patient and it makes more sense for him to take before therapy sessions.   6/21- pain for the first time controlled per PT this AM- con't prn Norco  6/24- pain in general worse- wants something in between pain meds prn- will add tramadol 50 mg q6 hours prn in between Norco dosing             Monitor with increased exertion 4. Mood: LCSW to follow for evaluation and support.              -antipsychotic agents: N/a 5. Neuropsych: This patient is not fully capable of making decisions on his own behalf. 6. Skin/Wound Care: Monitor wound for healing.   6/19: Small skin tear on buttock- continue to monitor.  7. Fluids/Electrolytes/Nutrition: Monitor I/O.  8. HTN: Monitor BP tid- continue Norvasc.             Monitor with increased mobility.   6/22- BP 110/50- con't regimen 9. T2DM with hyperglycemia: Monitor BS ac/hs.  Lantus increased to 15 units bid today. Now on Lantus  bid with SSI for elevated BS.   6/18: CBG has been as low as 47. Decrease Lantus to 14U BID  6/19: CBG down to 69 this morning. Decrease Lantus to 13U BID   CBG (last 3)  Recent Labs    05/03/20 1648 05/03/20 2114 05/04/20 0625  GLUCAP 116* 189* 98   6/22- when on higher dose of Lantus, pt's BGs dropped.              Monitor with increased mobility. 6/24- BGs well controlled- con't regimen 10. COPD: Wean oxygen as tolerated. Continue Breo/Incruse.  11. Acute on chronic renal failure: Trending to baseline line? Will order PVRs to monitor voiding.             Creatinine trending upward on 6/17: Bolus 250cc  6/18: Down to 2.3  6/21- Cr 2.19- appears to be baseline around 2.19 and BUN 55  6/22- Cr 2.29- today- not sure if due to keflex- but is still on lower side  6/24- Cr down to 2.01- much improved- con't regimen  12. ABLA/Hematuria: Continues--will continue to monitor H/H. Off Plavix and Lovenox at this time.             Hgb trending downward on 6/17. Up to 7.6 on 6/18.   6/21- no CBC done this AM- will order 13. Constipation: Had BM 6/19 with enema. Increased laxatives to colace TID and Senna HS  6/21- cleaned out yesterday 14. UTI: started keflex. Culture pending.   6/21- Sensitivities pending- is Klebsiella Oxytoca >100k  6/22- will change to Rocephin since WBC stil 16.6k  6/23- labs tomorrow- still requiring cathing- increased flomax to max dose yesterday.  6/24- WBC down to 14 from 16.6- will con't Rocephin- likely adding to urinary retention.  15. Urinary retention  6/24- on max dose of Flomax- no rash since allergic to Sulfa-     LOS: 8 days A FACE TO FACE EVALUATION WAS PERFORMED  Seniya Stoffers 05/04/2020, 10:44 AM

## 2020-05-04 NOTE — Progress Notes (Signed)
Physical Therapy Session Note  Patient Details  Name: [name] Small MRN: [id number] Date of Birth: 1928-02-22  Today's Date: 05/04/2020 PT Individual Time: 3005-1102 PT Individual Time Calculation (min): 9 min   Short Term Goals: Week 1:  PT Short Term Goal 1 (Week 1): Pt will peform bed mobility with minA. PT Short Term Goal 2 (Week 1): Pt will perform sit to stand with minA. PT Short Term Goal 3 (Week 1): Pt will perform bed to chair transfer with minA. PT Short Term Goal 4 (Week 1): Pt will self propel WC 50' with supervision.  Skilled Therapeutic Interventions/Progress Updates:    Patient received in bed, willing to perform short session prior to in and out cath. Able to complete bed mobility with MinA for management of LLE, performed L LAQs at EOB with Min-ModA from therapist and visible fatigue in quad muscle group, then worked on sitting balance including dynamic reaching and isometric core activation. Session cut short by need for in and out cath due to bladder volume. Returned to supine with MinA, left in bed in care of RN with all needs otherwise met and family member present.   Therapy Documentation Precautions:  Precautions Precautions: Fall Precaution Comments: TDWB LLE Restrictions Weight Bearing Restrictions: Yes LLE Weight Bearing: Touchdown weight bearing Other Position/Activity Restrictions: Pt has difficulty maintaining weight bearing. General: PT Amount of Missed Time (min): 21 Minutes PT Missed Treatment Reason: Other (Comment);Nursing care (nursing care) Pain: Pain Assessment Pain Scale: Faces Pain Score: 0-No pain Faces Pain Scale: Hurts little more Pain Type: Acute pain Pain Location: Leg Pain Orientation: Left Pain Descriptors / Indicators: Aching;Discomfort Pain Onset: On-going Patients Stated Pain Goal: 0 Pain Intervention(s): Repositioned Multiple Pain Sites: No    Therapy/Group: Individual Therapy   Windell Norfolk, DPT, PN1   Supplemental  Physical Therapist [place]    Pager [phone] Acute Rehab Office [phone]    05/04/2020, 3:55 PM

## 2020-05-04 NOTE — Progress Notes (Signed)
Occupational Therapy Weekly Progress Note  Patient Details  Name: [name] Small MRN: [id number] Date of Birth: 1928-04-20  Beginning of progress report period: April 27, 2020 End of progress report period: May 04, 2020  Today's Date: 05/04/2020 OT Individual Time: 4098-1191 and 4782-9562 OT Individual Time Calculation (min): 40 min and 56 min   Patient has met 2 of 4 short term goals.  Pt has improved his transfer skills with and without slide board, sit to stands as precursor skills for LB ADLs, and UB strength for functional transfers.   Patient continues to demonstrate the following deficits: muscle weakness and decreased cardiorespiratoy endurance and therefore will continue to benefit from skilled OT intervention to enhance overall performance with BADL and Reduce care partner burden.  Patient progressing toward long term goals..  Plan of care revisions: Goals downgraded to overall Min level. See LTGs for details. .  OT Short Term Goals Week 1:  OT Short Term Goal 1 (Week 1): Pt will perform slide board transfers to/from [facility] with Mod A while maintaining TDWB LLE OT Short Term Goal 1 - Progress (Week 1): Met OT Short Term Goal 2 (Week 1): Pt will perform clothing management via lateral leans on toilet with Mod A OT Short Term Goal 2 - Progress (Week 1): Progressing toward goal OT Short Term Goal 3 (Week 1): Pt will perform sit to stand while maintaining TDWB on LLE with Mod A OT Short Term Goal 3 - Progress (Week 1): Met OT Short Term Goal 4 (Week 1): Pt will perform LB dressing with Mod A using AE as needed OT Short Term Goal 4 - Progress (Week 1): Progressing toward goal Week 2:  OT Short Term Goal 1 (Week 2): Pt will perform lateral transfers to/from [facility] with CGA while maintaining TDWB LLE OT Short Term Goal 2 (Week 2): Pt will perform clothing management via lateral leans on BSC with Min A OT Short Term Goal 3 (Week 2): Pt will perform sit to stand while maintaining TDWB on  LLE with Min A OT Short Term Goal 4 (Week 2): Pt will perform LB dressing with Mod A using AE as needed  Skilled Therapeutic Interventions/Progress Updates:    Pt greeted at time of session reclined in bed resting and agreeable to OT session, pain on L side of body and nursing aware. Bed mobility for supine to sitting up at EOB with Min A to manage LLE, total assist to don shoe for R foot and L sock. Therapist retrieved bariatric drop arm BSC and brought to room, set up on R side of bed and pt performed lateral transfer with Min-Mod A with pushing through BUEs to lift and scoot buttocks over to [facility]. Simulated toileting activity with the patient bearing weight through RLE and BUEs to lift buttocks for therapist to don/doff clothing. Wife present and education completed. Lateral transfer back to bed Min-Mod A and pt returned to supine with Min A to manage LLE. Call bell in reach, alarm on, all needs met.   Session 2: Pt greeted at time of session reclined in bed with wife present and agreeable to OT session. Bed mobility supine to sitting up with Min A, lateral transfer bed > w/c with Min A. Once up in wheelchair performed shaving/grooming at sink level with set up/supervision. Lateral transfer w/c > drop arm bariatric BSC with Min-Mod A with bearing weight through RLE and BUEs. Pt stated he accidentally had a BM, lateral transfer back to wheelchair and then  back to bed in same manner with Min-Mod A. Sitting to supine with Min-Mod A to manage LLE and therapist and wife performed hygiene and pericare with the patient assisting rolling L and R at bed level. Max A to don pants back over hips at bed level with wife assisting and pt able to help. Wife present throughout session and family ed performed for transfers. Pt back in bed with call bell in reach, all needs met, alarm on.  Therapy Documentation Precautions:  Precautions Precautions: Fall Precaution Comments: TDWB LLE Restrictions Weight Bearing  Restrictions: Yes LLE Weight Bearing: Touchdown weight bearing Other Position/Activity Restrictions: Pt has difficulty maintaining weight bearing.     Therapy/Group: Individual Therapy  [name] 05/04/2020, 3:29 PM

## 2020-05-04 NOTE — Plan of Care (Signed)
  Problem: Sit to Stand Goal: LTG:  Patient will perform sit to stand in prep for activites of daily living with assistance level (OT) Description: LTG:  Patient will perform sit to stand in prep for activites of daily living with assistance level (OT) Flowsheets (Taken 05/04/2020 1547) LTG: PT will perform sit to stand in prep for activites of daily living with assistance level: (goal downgraded) Contact Guard/Touching assist Note: Downgraded d/t lack of progress   Problem: RH Bathing Goal: LTG Patient will bathe all body parts with assist levels (OT) Description: LTG: Patient will bathe all body parts with assist levels (OT) Flowsheets (Taken 05/04/2020 1551) LTG: Pt will perform bathing with assistance level/cueing: (goal downgraded) Minimal Assistance - Patient > 75% Note: Downgraded d/t lack of progress   Problem: RH Toileting Goal: LTG Patient will perform toileting task (3/3 steps) with assistance level (OT) Description: LTG: Patient will perform toileting task (3/3 steps) with assistance level (OT)  Flowsheets (Taken 05/04/2020 1551) LTG: Pt will perform toileting task (3/3 steps) with assistance level: (goal downgraded) Minimal Assistance - Patient > 75% Note: Downgraded d/t lack of progress   Problem: RH Toilet Transfers Goal: LTG Patient will perform toilet transfers w/assist (OT) Description: LTG: Patient will perform toilet transfers with assist, with/without cues using equipment (OT) Flowsheets (Taken 05/04/2020 1551) LTG: Pt will perform toilet transfers with assistance level of: (goal downgraded) Contact Guard/Touching assist Note: Downgraded d/t lack of progress   Problem: RH Tub/Shower Transfers Goal: LTG Patient will perform tub/shower transfers w/assist (OT) Description: LTG: Patient will perform tub/shower transfers with assist, with/without cues using equipment (OT) Flowsheets (Taken 05/04/2020 1551) LTG: Pt will perform tub/shower stall transfers with assistance  level of: (goal downgraded) Contact Guard/Touching assist Note: Downgraded d/t lack of progress, pt also not cleared to shower at this time

## 2020-05-05 ENCOUNTER — Inpatient Hospital Stay (HOSPITAL_COMMUNITY): Payer: Medicare Other | Admitting: Occupational Therapy

## 2020-05-05 ENCOUNTER — Encounter (HOSPITAL_COMMUNITY): Payer: Medicare Other | Admitting: Psychology

## 2020-05-05 ENCOUNTER — Inpatient Hospital Stay (HOSPITAL_COMMUNITY): Payer: Medicare Other

## 2020-05-05 ENCOUNTER — Inpatient Hospital Stay (HOSPITAL_COMMUNITY): Payer: Medicare Other | Admitting: Physical Therapy

## 2020-05-05 DIAGNOSIS — F4320 Adjustment disorder, unspecified: Secondary | ICD-10-CM

## 2020-05-05 LAB — GLUCOSE, CAPILLARY
Glucose-Capillary: 169 mg/dL — ABNORMAL HIGH (ref 70–99)
Glucose-Capillary: 241 mg/dL — ABNORMAL HIGH (ref 70–99)
Glucose-Capillary: 148 mg/dL — ABNORMAL HIGH (ref 70–99)
Glucose-Capillary: 154 mg/dL — ABNORMAL HIGH (ref 70–99)

## 2020-05-05 LAB — PATHOLOGIST SMEAR REVIEW

## 2020-05-05 MED ORDER — ENSURE MAX PROTEIN PO LIQD
11.0000 [oz_av] | Freq: Two times a day (BID) | ORAL | Status: DC
  Administered 2020-05-06 – 2020-05-07 (×2): 11 [oz_av] via ORAL
  Filled 2020-05-05 (×14): qty 330

## 2020-05-05 NOTE — Progress Notes (Signed)
Patient ID: [name] Small, male   DOB: [birth year], 86 y.o.MRN: [id number]  SW left message for [facility] SW Kellie Simmering [phone] ext. [phone]/work cell: [phone]) to pt d/c dateand discuss aide/ramp/DME needs [facility]) for pt. SW waiting on follow-up.  Loralee Pacas, MSW, [place] Office: [phone] Cell: [phone] Fax: [phone]

## 2020-05-05 NOTE — Progress Notes (Signed)
Physical Therapy Weekly Progress Note  Patient Details  Name: [name] Small MRN: [id number] Date of Birth: [birth year]  Beginning of progress report period: April 27, 2020 End of progress report period: May 05, 2020  Today's Date: 05/05/2020 PT Individual Time: 7793-9030 PT Individual Time Calculation (min): 25 min   Patient has met 3 of 4 short term goals.  Pt has progressed toward mobility goals, improving independence with bed mobility, balance, functional transfers, and WC mobility. Pt progress toward goals has at time been slowed by difficulty with pain control and pt requiring consistent encouragement for motivation to participate with therapy. Pt performs lateral transfer with improved safety versus slideboard transfer, with which he has difficulty placing slideboard. Pt also has difficulty maintaining TDWB precautions with LLE and requires frequent verbal reminders and manual placement of LLE to ensure WB precautions maintained. Pt will focus on continued work toward bed mobility and transfer independence prior to discharge, as well as standing balance and WB through RLE.  Patient continues to demonstrate the following deficits muscle weakness, decreased cardiorespiratoy endurance and decreased sitting balance, decreased standing balance, decreased balance strategies and difficulty maintaining precautions and therefore will continue to benefit from skilled PT intervention to increase functional independence with mobility.  Patient progressing toward long term goals..  Continue plan of care.  PT Short Term Goals Week 1:  PT Short Term Goal 1 (Week 1): Pt will peform bed mobility with minA. PT Short Term Goal 1 - Progress (Week 1): Met PT Short Term Goal 2 (Week 1): Pt will perform sit to stand with minA. PT Short Term Goal 2 - Progress (Week 1): Progressing toward goal PT Short Term Goal 3 (Week 1): Pt will perform bed to chair transfer with minA. PT Short Term Goal 3 - Progress (Week  1): Met PT Short Term Goal 4 (Week 1): Pt will self propel WC 50' with supervision. PT Short Term Goal 4 - Progress (Week 1): Met Week 2:  PT Short Term Goal 1 (Week 2): STGs=LTGs due to ELOS  Skilled Therapeutic Interventions/Progress Updates:     Pt received supine in bed and agreeable to therapy. Reports pain in LLE. Number not provided but reports pain better than previous day. PT provides rest breaks as needed to manage pain symptoms. Supine to sit with PT providing minA for LLE management. Pt performs lateral transfer to the R from bed to [facility] with minA facilitation at hips. WC transport to therapy gym for time management and energy conservation.   Sit to stand holding onto // bar and facing mirror for visual feedback, requiring modA at hips to complete. Pt performs RLE SLS heel raises 2x10. Pt performs x10 minisquats with RLE and modA from PT.  WC transport back to room. Lateral transfer back to bed with minA and sit to supine with minA to manage LLE. Pt left supine in bed with alarm intact and all needs within reach.  Therapy Documentation Precautions:  Precautions Precautions: Fall Precaution Comments: TDWB LLE Restrictions Weight Bearing Restrictions: Yes LLE Weight Bearing: Touchdown weight bearing Other Position/Activity Restrictions: Pt has difficulty maintaining weight bearing.   Therapy/Group: Individual Therapy  Breck Coons, PT, DPT 05/05/2020, 5:41 PM

## 2020-05-05 NOTE — Progress Notes (Signed)
Physical Therapy Session Note  Patient Details  Name: [name] Small MRN: [id number] Date of Birth: 1928-06-06  Today's Date: 05/05/2020 PT Individual Time: 1300-1400 PT Individual Time Calculation (min): 60 min   Short Term Goals: Week 1:  PT Short Term Goal 1 (Week 1): Pt will peform bed mobility with minA. PT Short Term Goal 2 (Week 1): Pt will perform sit to stand with minA. PT Short Term Goal 3 (Week 1): Pt will perform bed to chair transfer with minA. PT Short Term Goal 4 (Week 1): Pt will self propel WC 50' with supervision.  Skilled Therapeutic Interventions/Progress Updates:    Pt received seated in bed, agreeable to PT session. No complaints of pain. Semi-reclined in bed to sitting EOB with min A for LLE management, HOB elevated and use of bedrail. Squat pivot transfer bed to w/c with CGA. Manual w/c propulsion x 100 ft with use of BUE at Supervision level before onset of fatigue. Squat pivot transfer w/c to mat table with min A. Seated balance and endurance EOM while performing ball toss against rebounder, 2 x 30 reps. Sit to/from supine on flat mat table with mod A for BLE management into supine and mod A for trunk elevation from supine. Supine BLE strengthening therex x 10 reps with AAROM needed for LLE management: heel slides, hip abd, SAQ. Seated BLE strengthening therex x 10 reps with AAROM needed for LLE: marches, LAQ. Seated core strengthening exercises with 4# weighted exercise ball: punch-outs, OH lifts, diagonals 2 x 10 reps each. Pt initially on 2L O2 at beginning of session, SpO2 96% with activity so decreased O2 to 1L. With 1L O2 pt maintains SpO2 at 93% and above with activity during session. Pt is mod A to squat pivot back into w/c then back into bed. Sit to supine mod A for BLE management. Pt left semi-reclined in bed with needs in reach, bed alarm in place, wife present at end of session.  Therapy Documentation Precautions:  Precautions Precautions: Fall Precaution  Comments: TDWB LLE Restrictions Weight Bearing Restrictions: Yes LLE Weight Bearing: Touchdown weight bearing Other Position/Activity Restrictions: Pt has difficulty maintaining weight bearing.   Therapy/Group: Individual Therapy   Excell Seltzer, PT, DPT  05/05/2020, 3:42 PM

## 2020-05-05 NOTE — Progress Notes (Signed)
Occupational Therapy Session Note  Patient Details  Name: [name] Small MRN: [id number] Date of Birth: [birth year]  Today's Date: 05/05/2020 OT Individual Time: 7341-9379 OT Individual Time Calculation (min): 67 min    Short Term Goals: Week 2:  OT Short Term Goal 1 (Week 2): Pt will perform lateral transfers to/from [facility] with CGA while maintaining TDWB LLE OT Short Term Goal 2 (Week 2): Pt will perform clothing management via lateral leans on BSC with Min A OT Short Term Goal 3 (Week 2): Pt will perform sit to stand while maintaining TDWB on LLE with Min A OT Short Term Goal 4 (Week 2): Pt will perform LB dressing with Mod A using AE as needed  Skilled Therapeutic Interventions/Progress Updates:    Pt greeted at time of session reclined in bed with wife in room. Mild c/o pain throughout with transfers but overall improved compared to previous sessions. Collaboration with wife regarding home set up for bathroom, pt and wife feel most comfortable with drop arm BSC in their bedroom for toileting in stead of using bathroom until pt improves more. Pt transitioned supine to sitting up at EOB with Min A to manage LLE but otherwise able to complete without help, lateral transfer to drop arm bariatric BSC with Min-CGA with therapist assist to manage LLE and wife present for education throughout. Pt able to bear weight through BUEs and lift buttocks, total assist to doff pants and brief prior to small BM on toilet. Total A for hygiene by wife but pt able to lift buttocks for her to perform. Total A to don pants and brief back over hips with the patient able to lift buttocks by bearing weight through [place]. Wife present for entire session and able to perform hygiene and assist with clothing management with the patient on [facility]. Patient and wife feel good about performing at home. Lateral transfer back to bed with CGA-Min A only to manage LLE. Pt back in bed supine with call bell in reach, alarm on, all needs  met. Note wife provided measurements in their home, bed is 26" height, doors are 28" wide, and wheelchair (transport chair) is 20.5" wide.   Therapy Documentation Precautions:  Precautions Precautions: Fall Precaution Comments: TDWB LLE Restrictions Weight Bearing Restrictions: Yes LLE Weight Bearing: Touchdown weight bearing Other Position/Activity Restrictions: Pt has difficulty maintaining weight bearing.     Therapy/Group: Individual Therapy  [name] 05/05/2020, 12:14 PM

## 2020-05-05 NOTE — Progress Notes (Signed)
[place] PHYSICAL MEDICINE & REHABILITATION PROGRESS NOTE   Subjective/Complaints:  Has a recliner/standing chair at home, FYI.  Doing much better this Am with pain- had a better night- pain much better with tramadol added.   Still requiring O2 by [place]  ROS:   Pt denies SOB, abd pain, CP, N/V/C/D, and vision changes   Objective:   No results found. Recent Labs    05/04/20 0517  WBC 14.5*  HGB 8.5*  HCT 27.5*  PLT 481*   Recent Labs    05/04/20 0517  NA 135  K 4.8  CL 97*  CO2 30  GLUCOSE 116*  BUN 44*  CREATININE 2.01*  CALCIUM 8.0*    Intake/Output Summary (Last 24 hours) at 05/05/2020 1355 Last data filed at 05/05/2020 0145 Gross per 24 hour  Intake 777.74 ml  Output 1950 ml  Net -1172.26 ml     Physical Exam: Vital Signs Blood pressure (!) 120/52, pulse 60, temperature 98.2 F (36.8 C), temperature source Oral, resp. rate 18, height 6\' 2"  (1.88 m), weight 98.8 kg, SpO2 90 %. Nursing note and vitals reviewed. Constitutional: sleeping, but woke to verbal stimuli; appropriate, NAD HENT:  Conjugate gaze CV: borderline bradycardia; regula rrhythm Respiratory: CTA B/L- no W/R/R- good air movement GI: Soft, NT, ND, (+)BS    Musculoskeletal:        General: Swelling present.     Cervical back: Normal range of motion.     Comments: Moderate edema left hip to knee. Mild edema L hip- incision looks great. No drainage or erythema Left transmet amputation site well healed. Looks great/scarred  Neurological: He is alert.  Motor: B/l UE: 5/5 proximal to distal RLE: HF: 2+/5, KE 3/5, ADF 4+/5 LLE: HF 2-/5, KE 2+/5, ADF 3/5 HOH- dysphonia much improved- almost normal Skin: Skin is warm and dry. Small tear on buttock- not open.  Psychiatric: appropriate, sleepy- says pain much better  Assessment/Plan: 1. Functional deficits secondary to deficits with mobility, endurance, self-care secondary to left periprosthetic femur fracture, which require 3+ hours per day  of interdisciplinary therapy in a comprehensive inpatient rehab setting.  Physiatrist is providing close team supervision and 24 hour management of active medical problems listed below.  Physiatrist and rehab team continue to assess barriers to discharge/monitor patient progress toward functional and medical goals  Care Tool:  Bathing  Bathing activity did not occur: Refused Body parts bathed by patient: Right arm, Left arm, Chest, Abdomen   Body parts bathed by helper: Right lower leg, Left lower leg     Bathing assist Assist Level: Maximal Assistance - Patient 24 - 49%     Upper Body Dressing/Undressing Upper body dressing   What is the patient wearing?: Pull over shirt    Upper body assist Assist Level: Contact Guard/Touching assist    Lower Body Dressing/Undressing Lower body dressing    Lower body dressing activity did not occur: Refused What is the patient wearing?: Incontinence brief, Pants     Lower body assist Assist for lower body dressing: Total Assistance - Patient < 25% (while pushing up through [place] on [facility], wife able to don over hips)     Toileting Toileting    Toileting assist Assist for toileting: Total Assistance - Patient < 25% (at [facility], able to lift buttocks for wife to perform hygiene) Assistive Device Comment: Urinal   Transfers Chair/bed transfer  Transfers assist     Chair/bed transfer assist level: Minimal Assistance - Patient > 75%  Locomotion Ambulation   Ambulation assist   Ambulation activity did not occur: Safety/medical concerns          Walk 10 feet activity   Assist  Walk 10 feet activity did not occur: Safety/medical concerns        Walk 50 feet activity   Assist Walk 50 feet with 2 turns activity did not occur: Safety/medical concerns         Walk 150 feet activity   Assist Walk 150 feet activity did not occur: Safety/medical concerns         Walk 10 feet on uneven surface   activity   Assist Walk 10 feet on uneven surfaces activity did not occur: Safety/medical concerns         Wheelchair     Assist Will patient use wheelchair at discharge?: Yes Type of Wheelchair: Manual Wheelchair activity did not occur: Refused  Wheelchair assist level: Minimal Assistance - Patient > 75% Max wheelchair distance: 100    Wheelchair 50 feet with 2 turns activity    Assist    Wheelchair 50 feet with 2 turns activity did not occur: Refused   Assist Level: Minimal Assistance - Patient > 75%   Wheelchair 150 feet activity     Assist  Wheelchair 150 feet activity did not occur: Safety/medical concerns       Blood pressure (!) 120/52, pulse 60, temperature 98.2 F (36.8 C), temperature source Oral, resp. rate 18, height 6\' 2"  (1.88 m), weight 98.8 kg, SpO2 90 %.    Medical Problem List and Plan: 1.  Deficits with mobility, endurance, self-care secondary to left periprosthetic femur fracture.              -patient may not  shower             -ELOS/Goals: 10-14 days/Min A            -Continue CIR  -Needs to person assist for lower body dressing.  2.  Antithrombotics: -DVT/anticoagulation:  Mechanical: Sequential compression devices, below knee Bilateral lower extremities Will check dopplers.             -antiplatelet therapy: N/A 3. Pain Management: Has been on hydrocodone 5 mg every 8 hours. Changed to PRN as sedating patient and it makes more sense for him to take before therapy sessions.   6/21- pain for the first time controlled per PT this AM- con't prn Norco  6/24- pain in general worse- wants something in between pain meds prn- will add tramadol 50 mg q6 hours prn in between Norco dosing  6/25- pain MUCH improved with alternating tramadol and oxy             Monitor with increased exertion 4. Mood: LCSW to follow for evaluation and support.              -antipsychotic agents: N/a 5. Neuropsych: This patient is not fully capable of making  decisions on his own behalf. 6. Skin/Wound Care: Monitor wound for healing.   6/19: Small skin tear on buttock- continue to monitor.  7. Fluids/Electrolytes/Nutrition: Monitor I/O.  8. HTN: Monitor BP tid- continue Norvasc.             Monitor with increased mobility.   6/22- BP 110/50- con't regimen  6/25- well controlled- con't meds 9. T2DM with hyperglycemia: Monitor BS ac/hs.  Lantus increased to 15 units bid today. Now on Lantus bid with SSI for elevated BS.   6/18: CBG has been as  low as 47. Decrease Lantus to 14U BID  6/19: CBG down to 69 this morning. Decrease Lantus to 13U BID   CBG (last 3)  Recent Labs    05/04/20 2057 05/05/20 0604 05/05/20 1154  GLUCAP 218* 169* 241*   6/22- when on higher dose of Lantus, pt's BGs dropped.              Monitor with increased mobility. 6/24- BGs well controlled- con't regimen  6/25- BGs labile today, but had been well controlled last few days- wife brings food from home some-I was told- might be changing BGs- will leave Lantus at current dose  10. COPD: Wean oxygen as tolerated. Continue Breo/Incruse.  11. Acute on chronic renal failure: Trending to baseline line? Will order PVRs to monitor voiding.             Creatinine trending upward on 6/17: Bolus 250cc  6/18: Down to 2.3  6/21- Cr 2.19- appears to be baseline around 2.19 and BUN 55  6/22- Cr 2.29- today- not sure if due to keflex- but is still on lower side  6/24- Cr down to 2.01- much improved- con't regimen  12. ABLA/Hematuria: Continues--will continue to monitor H/H. Off Plavix and Lovenox at this time.             Hgb trending downward on 6/17. Up to 7.6 on 6/18.   6/21- no CBC done this AM- will order 13. Constipation: Had BM 6/19 with enema. Increased laxatives to colace TID and Senna HS  6/21- cleaned out yesterday 14. UTI: started keflex. Culture pending.   6/21- Sensitivities pending- is Klebsiella Oxytoca >100k  6/22- will change to Rocephin since WBC stil  16.6k  6/23- labs tomorrow- still requiring cathing- increased flomax to max dose yesterday.  6/24- WBC down to 14 from 16.6- will con't Rocephin- likely adding to urinary retention.  15. Urinary retention  6/24- on max dose of Flomax- no rash since allergic to Sulfa-  6/25 -still requiring intermittent caths    LOS: 9 days A FACE TO FACE EVALUATION WAS PERFORMED  Algie Westry 05/05/2020, 1:55 PM

## 2020-05-05 NOTE — Consult Note (Signed)
Neuropsychological Consultation   Patient:   [name] Small   DOB:   Jul 18, 1928  MR Number:  [id number]  Location:  [place] [id number] [place] [address] Dept: [place]: [phone]           Date of Service:   05/05/2020  Start Time:   9 AM End Time:   10 AM  Provider/Observer:  Ilean Skill, Psy.D.       Clinical Neuropsychologist       Billing Code/Service: [phone]  Chief Complaint:    [place] Bures is an 86 year old male with a history of CAD, type 2 diabetes with neuropathy, hypertension, COPD, CKD.  The patient was admitted on 04/18/2020 after fall and landing on his left hip.  Work-up revealed proximal femoral fracture and moderate to advanced right hip OA.  Patient underwent ORIF left proximal femoral fracture on 04/19/2020.  Patient has had issues with insomnia, pain and acute on chronic renal failure.  Hospital course has been complicated by gross hematuria with clots and drop in H/H.  Patient did have a head CT conducted on 6/14 due to lethargy and showed chronic small vessel disease and generalized brain atrophy.  Patient has significant hearing loss.  More recently he has been having symptoms that the family identified and had concerns about depressive reaction as well.  Patient has continued to make progress on CIR and reports that he had a much better night sleeping last night which is helped with mood today so far.  Reason for Service:  Patient was referred for neuropsychological consultation due to coping and adjustment issues, ongoing pain symptoms, insomnia and concerns about impact on rehabilitative efforts.  Below is the HPI for the current admission.  HPI: [name] Small is an 86 year old male with history of CAD, T2DM with neuropathy, HTN, COPD- oxygen prn, CKD who was admitted on 04/18/2020 after fall, landing on his left hip.  Patient fell off the curb onto  left hip with reports of hip and rib pain. Work up done revealing oblique peri-prosthetic proximal femur fracture and moderate to advanced right hip OA.  He was placed in Buck's traction and Dr. [name] consulted due to complexity of injury. Patient underwent  ORIF left proximal femur fracture on 04/19/2020 -- to be TDWB on LLE and started on lovenox for DVT prophylaxis.  He has had issues with insomnia, pain and acute on chronic renal failure with rise in SCr to  2.71. Renal ultrasound done 6/12 due to acute on chronic renal failure and showed medical renal disease without nephrosis.  Hospital course complicated by gross hematuria with clots and drop in H/H to  7.3/22.3. he was transfused with one unit PRBC. 3 way foley placed and CBI initiated -- bladder also hand flushed by Dr. Ishmael Holter.  Foley was removed on 06/14 and condom cath in place. He has had worsening of renal status with K+ 5.8 and treated with lokelma yesterday. SCr has been on rise to 2.17. CT head done 04/24/2020 due to lethargy and showed chronic small vessel disease and generalized brain atrophy. Therapy ongoing and he is showing improvement in bed mobility-->was incontinent of bowel this am.  He was immobile over the weekend due to need for CBI but  tolerated up in chair for 1 1/2 hours yesterday. Continues to have pain limiting mobility. Please see preadmission assessment from earlier today as well.   Current Status:  The patient was lying back in his bed in a semiupright position with his eyes closed appearing to sleep with wife at bedside upon entering the room.  The patient did have some difficulty opening his eyes during our conversations and struggled with hearing loss and difficulty hearing.  The patient's wife acknowledged observing symptoms she saw as potentially indications of depressive responses and the patient knowledged frustration with his loss of function.  He has been in the hospital multiple times recently and he describes this  hospitalization and medical issue as the worst that he has dealt with.  The patient reports that he had been having very poor sleep which added to his emotional responses.  The patient reports that last night was the first night he had slept well and that his pain has been significantly better over the past 24 hours.  This is also helped improve his mood from his perspective today.  Behavioral Observation: [name] Small  presents as a 86 y.o.-year-old Right Caucasian Male who appeared his stated age. his dress was Appropriate and he was Well Groomed and his manners were Appropriate to the situation.  his participation was indicative of Appropriate and Redirectable behaviors.  There were physical disabilities noted.  he displayed an appropriate level of cooperation and motivation.     Interactions:    Minimal Drowsy and Redirectable  Attention:   abnormal and attention span appeared shorter than expected for age  Memory:   within normal limits; recent and remote memory intact  Visuo-spatial:  not examined  Speech (Volume):  low  Speech:   normal; normal  Thought Process:  Coherent and Relevant  Though Content:  WNL; not suicidal and not homicidal  Orientation:   person, place, time/date and situation  Judgment:   Fair  Planning:   Poor  Affect:    Flat and Lethargic  Mood:    Dysphoric  Insight:   Fair  Intelligence:   high  Medical History:   Past Medical History:  Diagnosis Date  . (HFpEF) heart failure with preserved ejection fraction ([place])   . Arthritis   . Back pain    s/p lumbar injection 2014  . BENIGN PROSTATIC HYPERTROPHY 05/21/2007  . Bladder cancer ([place]) 10/2011  . CAD (coronary artery disease)    nonobstructive by cath 6/12:  mid LAD 30%, proximal obtuse marginal-2 30%, proximal RCA 20%, mid RCA 30-40%.  He had normal cardiac output and mildly elevated filling pressures but no significant pulmonary hypertension;   Echocardiogram in May 2012 demonstrated EF  50-55% and left atrial enlargement   . Cellulitis of left foot    Hospitalized in 2006  . Chronic kidney disease (CKD), stage III (moderate) 12/04/2007   FOLLOWED BY DR PATEL  . Chronic pain of lower extremity   . Complication of anesthesia    1 time bladder cancer surgery- 2012 , oxygen satruratioon dropped had to stay overnight, no problems since then.  Marland Kitchen COPD (chronic obstructive pulmonary disease) ([place])   . DIABETES MELLITUS, TYPE II 05/21/2007  . DIVERTICULOSIS, COLON 05/21/2007   pt denies  . Dyspnea    with exertion, patient mointors saturation  . Fatigue AGE-RELATED  . Gross hematuria    pt denies  . HYPERTENSION 05/21/2007  . Impaired hearing BILATERAL HEARING AIDS  . On home oxygen therapy    as needed  . PAD (peripheral artery disease) ([place])    a. 02/2016 L foot nonhealing ulcer-->Periph angio: L pop 100 w/ reconstitution via extensive vollats  to the prox-mid peroneal (only patent vessel BK)-->PTA of L Pop and peroneal; b. Ongoing LE ischemia 5 & 06/2016 req amputation of toes on L foot.  . Pneumonia    Hx  of   . Psoriasis ELBOWS  . Psoriasis   . PSORIASIS, SCALP 10/25/2008   Psychiatric History:  No prior psychiatric history  Family Med/Psych History:  Family History  Problem Relation Age of Onset  . Diabetes Mother   . Heart disease Brother   . Heart disease Brother   . Heart disease Brother   . Heart disease Brother   . Heart disease Brother   . Heart disease Brother   . Lung cancer Brother    Impression/DX:  Fischer N. Luger is an 86 year old male with a history of CAD, type 2 diabetes with neuropathy, hypertension, COPD, CKD.  The patient was admitted on 04/18/2020 after fall and landing on his left hip.  Work-up revealed proximal femoral fracture and moderate to advanced right hip OA.  Patient underwent ORIF left proximal femoral fracture on 04/19/2020.  Patient has had issues with insomnia, pain and acute on chronic renal failure.  Hospital course has been  complicated by gross hematuria with clots and drop in H/H.  Patient did have a head CT conducted on 6/14 due to lethargy and showed chronic small vessel disease and generalized brain atrophy.  Patient has significant hearing loss.  More recently he has been having symptoms that the family identified and had concerns about depressive reaction as well.  Patient has continued to make progress on CIR and reports that he had a much better night sleeping last night which is helped with mood today so far.  The patient was lying back in his bed in a semiupright position with his eyes closed appearing to sleep with wife at bedside upon entering the room.  The patient did have some difficulty opening his eyes during our conversations and struggled with hearing loss and difficulty hearing.  The patient's wife acknowledged observing symptoms she saw as potentially indications of depressive responses and the patient knowledged frustration with his loss of function.  He has been in the hospital multiple times recently and he describes this hospitalization and medical issue as the worst that he has dealt with.  The patient reports that he had been having very poor sleep which added to his emotional responses.  The patient reports that last night was the first night he had slept well and that his pain has been significantly better over the past 24 hours.  This is also helped improve his mood from his perspective today.  Disposition/Plan:  The patient is struggled with significant insomnia exacerbating frustration and straining coping resources.  The patient has had numerous medical issues to cope with recently and this is an extremely challenging one for him and his family.  The patient reports that his pain and sleep is improved over the past 24 hours which has significantly helped with his mood state.  Patient acknowledges some reactive depressive symptomatology that is likely been exacerbated by sleep.  I will continue to  monitor this and see the patient first of next week.   Hip fracture and adjustment reaction of late life with loss of function and depressive symptoms.       Electronically Signed   _______________________ Ilean Skill, Psy.D.

## 2020-05-05 NOTE — Progress Notes (Signed)
Physical Therapy Session Note  Patient Details  Name: [name] Small MRN: [id number] Date of Birth: 1928/09/23  Today's Date: 05/05/2020 PT Individual Time: 7841-2820 PT Individual Time Calculation (min): 60 min   Short Term Goals: Week 1:  PT Short Term Goal 1 (Week 1): Pt will peform bed mobility with minA. PT Short Term Goal 2 (Week 1): Pt will perform sit to stand with minA. PT Short Term Goal 3 (Week 1): Pt will perform bed to chair transfer with minA. PT Short Term Goal 4 (Week 1): Pt will self propel WC 50' with supervision.   Skilled Therapeutic Interventions/Progress Updates:   Pt received supine in bed and agreeable to PT. Supine>sit transfer with min assist at trunk and LLE. SB transfer to [facility] with min assist and min cues for TDWB on the L.   WC mobility through hall x 1109f and supervision assist for safety. Pt [facility] to [facility] WC mobiltiy on unlevel cement sidewalk x 1220fand 8077fith short rest break. Cues for safety and turning technique on unlevel cement sidewalk.   Sit<>stand x 3 in parallel bars with min assist to maintain TDWB and for safety; heavy use of BUE to pull on rails. Pt able to maintain standing position 3x 30 sec.    Seated therex in WC, LAQ, hip abduction, hip flexion, ankle DF/PF. Each completed x 15 BLE with AAROM on the the LLE.    Pt returned to room and performed SB transfer to bed with min A to maintain TDWB. Sit>supine completed with min assist at the LLE and left supine in bed with call bell in reach and all needs met.           Therapy Documentation Precautions:  Precautions Precautions: Fall Precaution Comments: TDWB LLE Restrictions Weight Bearing Restrictions: Yes LLE Weight Bearing: Touchdown weight bearing Other Position/Activity Restrictions: Pt has difficulty maintaining weight bearing.   Vital Signs: Oxygen Therapy SpO2: 90 % O2 Device: Nasal Cannula O2 Flow Rate (L/min): 2 L/min Pain: Denies at rest    Therapy/Group: Individual Therapy  AusLorie Phenix25/2021, 11:37 AM

## 2020-05-06 ENCOUNTER — Other Ambulatory Visit: Payer: Self-pay | Admitting: Family Medicine

## 2020-05-06 ENCOUNTER — Inpatient Hospital Stay (HOSPITAL_COMMUNITY): Payer: Medicare Other

## 2020-05-06 LAB — GLUCOSE, CAPILLARY
Glucose-Capillary: 110 mg/dL — ABNORMAL HIGH (ref 70–99)
Glucose-Capillary: 193 mg/dL — ABNORMAL HIGH (ref 70–99)
Glucose-Capillary: 157 mg/dL — ABNORMAL HIGH (ref 70–99)
Glucose-Capillary: 178 mg/dL — ABNORMAL HIGH (ref 70–99)

## 2020-05-06 NOTE — Progress Notes (Signed)
[place] PHYSICAL MEDICINE & REHABILITATION PROGRESS NOTE   Subjective/Complaints:  Still requiring O2 by [place].   Being cathed all the time- not voiding at all- although pt said earlier/before he was peeing "some".   Per wife, pt was "slow pee'er" prior to hospitalization.   Willing to learn in/out caths if could help him get recovery- will have her taught today.    ROS:   Pt denies SOB, abd pain, CP, N/V/C/D, and vision changes   Objective:   No results found. Recent Labs    05/04/20 0517  WBC 14.5*  HGB 8.5*  HCT 27.5*  PLT 481*   Recent Labs    05/04/20 0517  NA 135  K 4.8  CL 97*  CO2 30  GLUCOSE 116*  BUN 44*  CREATININE 2.01*  CALCIUM 8.0*    Intake/Output Summary (Last 24 hours) at 05/06/2020 1427 Last data filed at 05/06/2020 1244 Gross per 24 hour  Intake 440 ml  Output 2350 ml  Net -1910 ml     Physical Exam: Vital Signs Blood pressure 123/63, pulse 60, temperature (!) 97.5 F (36.4 C), temperature source Oral, resp. rate 14, height 6\' 2"  (1.88 m), weight 96.4 kg, SpO2 98 %. Nursing note and vitals reviewed. Constitutional: sleeping again, but wakes to stimuli, wife at bedside, NAD HENT:  Conjugate gaze CV: borderline bradycardia - regular rhythm Respiratory: CTA B/L- no W/R/R- good air movement GI: Soft, NT, ND, (+)BS     Musculoskeletal:        General: Swelling present.     Cervical back: Normal range of motion.     Comments: Moderate edema left hip to knee. Mild edema L hip- incision looks great. No drainage or erythema Left transmet amputation site well healed. Looks great/scarred  Neurological: He is alert.  Motor: B/l UE: 5/5 proximal to distal RLE: HF: 2+/5, KE 3/5, ADF 4+/5 LLE: HF 2-/5, KE 2+/5, ADF 3/5 HOH- dysphonia much improved- almost normal Skin: Skin is warm and dry. Small tear on buttock- not open.  Psychiatric: sleepy-    Assessment/Plan: 1. Functional deficits secondary to deficits with mobility, endurance,  self-care secondary to left periprosthetic femur fracture, which require 3+ hours per day of interdisciplinary therapy in a comprehensive inpatient rehab setting.  Physiatrist is providing close team supervision and 24 hour management of active medical problems listed below.  Physiatrist and rehab team continue to assess barriers to discharge/monitor patient progress toward functional and medical goals  Care Tool:  Bathing  Bathing activity did not occur: Refused Body parts bathed by patient: Right arm, Left arm, Chest, Abdomen   Body parts bathed by helper: Right lower leg, Left lower leg     Bathing assist Assist Level: Maximal Assistance - Patient 24 - 49%     Upper Body Dressing/Undressing Upper body dressing   What is the patient wearing?: Pull over shirt    Upper body assist Assist Level: Set up assist    Lower Body Dressing/Undressing Lower body dressing    Lower body dressing activity did not occur: Refused What is the patient wearing?: Pants     Lower body assist Assist for lower body dressing: Maximal Assistance - Patient 25 - 49%     Toileting Toileting    Toileting assist Assist for toileting: Total Assistance - Patient < 25% (at [facility], able to lift buttocks for wife to perform hygiene) Assistive Device Comment: Urinal   Transfers Chair/bed transfer  Transfers assist     Chair/bed transfer assist  level: Moderate Assistance - Patient 50 - 74%     Locomotion Ambulation   Ambulation assist   Ambulation activity did not occur: Safety/medical concerns          Walk 10 feet activity   Assist  Walk 10 feet activity did not occur: Safety/medical concerns        Walk 50 feet activity   Assist Walk 50 feet with 2 turns activity did not occur: Safety/medical concerns         Walk 150 feet activity   Assist Walk 150 feet activity did not occur: Safety/medical concerns         Walk 10 feet on uneven surface  activity   Assist  Walk 10 feet on uneven surfaces activity did not occur: Safety/medical concerns         Wheelchair     Assist Will patient use wheelchair at discharge?: Yes Type of Wheelchair: Manual Wheelchair activity did not occur: Refused  Wheelchair assist level: Supervision/Verbal cueing Max wheelchair distance: 100    Wheelchair 50 feet with 2 turns activity    Assist    Wheelchair 50 feet with 2 turns activity did not occur: Refused   Assist Level: Supervision/Verbal cueing   Wheelchair 150 feet activity     Assist  Wheelchair 150 feet activity did not occur: Safety/medical concerns       Blood pressure 123/63, pulse 60, temperature (!) 97.5 F (36.4 C), temperature source Oral, resp. rate 14, height 6\' 2"  (1.88 m), weight 96.4 kg, SpO2 98 %.    Medical Problem List and Plan: 1.  Deficits with mobility, endurance, self-care secondary to left periprosthetic femur fracture.              -patient may not  shower             -ELOS/Goals: 10-14 days/Min A            -Continue CIR  -Needs to person assist for lower body dressing.  2.  Antithrombotics: -DVT/anticoagulation:  Mechanical: Sequential compression devices, below knee Bilateral lower extremities Will check dopplers.             -antiplatelet therapy: N/A 3. Pain Management: Has been on hydrocodone 5 mg every 8 hours. Changed to PRN as sedating patient and it makes more sense for him to take before therapy sessions.   6/21- pain for the first time controlled per PT this AM- con't prn Norco  6/24- pain in general worse- wants something in between pain meds prn- will add tramadol 50 mg q6 hours prn in between Norco dosing  6/25- pain MUCH improved with alternating tramadol and oxy             Monitor with increased exertion 4. Mood: LCSW to follow for evaluation and support.              -antipsychotic agents: N/a 5. Neuropsych: This patient is not fully capable of making decisions on his own behalf. 6.  Skin/Wound Care: Monitor wound for healing.   6/19: Small skin tear on buttock- continue to monitor.  7. Fluids/Electrolytes/Nutrition: Monitor I/O.  8. HTN: Monitor BP tid- continue Norvasc.             Monitor with increased mobility.   6/22- BP 110/50- con't regimen  6/25- well controlled- con't meds 9. T2DM with hyperglycemia: Monitor BS ac/hs.  Lantus increased to 15 units bid today. Now on Lantus bid with SSI for elevated BS.   6/18:  CBG has been as low as 47. Decrease Lantus to 14U BID  6/19: CBG down to 69 this morning. Decrease Lantus to 13U BID   CBG (last 3)  Recent Labs    05/05/20 2124 05/06/20 0600 05/06/20 1159  GLUCAP 154* 110* 193*   6/22- when on higher dose of Lantus, pt's BGs dropped.              Monitor with increased mobility. 6/26- BGs overall controlled- con't regimen 10. COPD: Wean oxygen as tolerated. Continue Breo/Incruse.  6/26- used O2 intermittently at home prior- cannot seem to wean off O2- will d/w nursing.   11. Acute on chronic renal failure: Trending to baseline line? Will order PVRs to monitor voiding.             Creatinine trending upward on 6/17: Bolus 250cc  6/18: Down to 2.3  6/21- Cr 2.19- appears to be baseline around 2.19 and BUN 55  6/22- Cr 2.29- today- not sure if due to keflex- but is still on lower side  6/24- Cr down to 2.01- much improved- con't regimen  12. ABLA/Hematuria: Continues--will continue to monitor H/H. Off Plavix and Lovenox at this time.             Hgb trending downward on 6/17. Up to 7.6 on 6/18.   6/21- no CBC done this AM- will order 13. Constipation: Had BM 6/19 with enema. Increased laxatives to colace TID and Senna HS  6/21- cleaned out yesterday 14. UTI: started keflex. Culture pending.   6/21- Sensitivities pending- is Klebsiella Oxytoca >100k  6/22- will change to Rocephin since WBC stil 16.6k  6/23- labs tomorrow- still requiring cathing- increased flomax to max dose yesterday.  6/24- WBC down to 14  from 16.6- will con't Rocephin- likely adding to urinary retention.  15. Urinary retention  6/24- on max dose of Flomax- no rash since allergic to Sulfa-  6/25 -still requiring icaths  6/26- will have wife taught how to in/out cath since no voiding at this time- no Sx's of reaction.     LOS: 10 days A FACE TO FACE EVALUATION WAS PERFORMED  Joy Reiger 05/06/2020, 2:27 PM

## 2020-05-06 NOTE — Progress Notes (Signed)
Discussed with wife process for intermittent catheterization. Had pt and wife watch process. Provided handout and will review later for questions. Wife will perform I/O cath with assist during next cath when she is here. Wife verbalized an understanding and was receptive to information provided as well as having to do return demo.

## 2020-05-06 NOTE — Progress Notes (Signed)
Physical Therapy Session Note  Patient Details  Name: [name] Small MRN: [id number] Date of Birth: [birth year]  Today's Date: 05/06/2020 PT Individual Time: 1102-1159 PT Individual Time Calculation (min): 57 min   Short Term Goals: Week 2:  PT Short Term Goal 1 (Week 2): STGs=LTGs due to ELOS  Skilled Therapeutic Interventions/Progress Updates:     Pt received supine in bed and agreeable to therapy. Reports some pain in LLE. Number not provided. PT provides rest breaks as needed for pain management. Supine to sit with supervision and verbal cues for hand placement and logrolling technique. Lateral transfer to the R from bed to [facility] with CGA. WC transport to gym for energy conservation. Lateral transfer from [facility] to mat table. Sit to supine with minA to manage LLE. Pt perform supine therex for BLE strengthening, especiall LLE. Pt performs AAROM hip abduction, SAQs, heel slides, and SLRs with LLE, 3x10. Pt has 4lb ankle weight on RLE and performs 3x15 SAQs and SLRs. Supine to sit with supervision. Lateral transfer to WC. Pt performs x3 reps of sit to stand with // bar and modA at hips. Pt performs x10 heel raises with RLE each bout. WC transport back to room. Lateral transfer with CGA and sit to supine with minA. Pt left supine in bed with alarm intact and all needs within reach.  Therapy Documentation Precautions:  Precautions Precautions: Fall Precaution Comments: TDWB LLE Restrictions Weight Bearing Restrictions: Yes LLE Weight Bearing: Touchdown weight bearing Other Position/Activity Restrictions: Pt has difficulty maintaining weight bearing.    Therapy/Group: Individual Therapy  Breck Coons, PT, DPT 05/06/2020, 4:06 PM

## 2020-05-06 NOTE — Progress Notes (Signed)
Occupational Therapy Session Note  Patient Details  Name: [name] Small MRN: [id number] Date of Birth: [birth year]  Today's Date: 05/06/2020 OT Individual Time: 0700-0800 OT Individual Time Calculation (min): 60 min    Short Term Goals: Week 2:  OT Short Term Goal 1 (Week 2): Pt will perform lateral transfers to/from [facility] with CGA while maintaining TDWB LLE OT Short Term Goal 2 (Week 2): Pt will perform clothing management via lateral leans on BSC with Min A OT Short Term Goal 3 (Week 2): Pt will perform sit to stand while maintaining TDWB on LLE with Min A OT Short Term Goal 4 (Week 2): Pt will perform LB dressing with Mod A using AE as needed  Skilled Therapeutic Interventions/Progress Updates:    OT session focused on functional transfers, maintaining TDWB precautions, caregiver education, and activity tolerance. Pt received supine in bed requesting to shave and dress only. Completed supine> sit with min A to manage LLE then lateral transfer to w/c with min A. Completed grooming/UB dressing with set-up assist. Pt required max A for LB dressing then completed sit<>stand from w/c with mod A and caregiver assisted with managing pants around waist. Completed w/c mobility 100+ feet to work on UB strength and endurance for transfers.Pt required occasional min A due to turning towards left side. Returned to room and completed lateral scoot transfer with mod A due to fatigue and mod cues for adhering to precautions. Pt left with all needs in reach. Caregiver reported using lift chair upon home. Discussed concerns with stand pivot transfers as pt has difficulty maintaining precautions. Will inform therapy team.   Therapy Documentation Precautions:  Precautions Precautions: Fall Precaution Comments: TDWB LLE Restrictions Weight Bearing Restrictions: Yes LLE Weight Bearing: Touchdown weight bearing Other Position/Activity Restrictions: Pt has difficulty maintaining weight bearing. General:    Vital Signs:  Pain: Pain Assessment Pain Score: Asleep ADL: ADL Where Assessed-Upper Body Dressing: Chair Lower Body Dressing: Dependent Toileting: Dependent Toilet Transfer: Maximal assistance Toilet Transfer Method: Other (comment) (slide board with other rehab staff, Charlaine Dalton transfer) Vision   Perception    Praxis   Exercises:   Other Treatments:     Therapy/Group: Individual Therapy  [name] 05/06/2020, 7:57 AM

## 2020-05-07 ENCOUNTER — Inpatient Hospital Stay (HOSPITAL_COMMUNITY): Payer: Medicare Other

## 2020-05-07 LAB — CBC WITH DIFFERENTIAL/PLATELET
Abs Immature Granulocytes: 0.55 10*3/uL — ABNORMAL HIGH (ref 0.00–0.07)
Basophils Absolute: 0.1 10*3/uL (ref 0.0–0.1)
Basophils Relative: 1 %
Eosinophils Absolute: 0.4 10*3/uL (ref 0.0–0.5)
Eosinophils Relative: 4 %
HCT: 30.4 % — ABNORMAL LOW (ref 39.0–52.0)
Hemoglobin: 9.5 g/dL — ABNORMAL LOW (ref 13.0–17.0)
Immature Granulocytes: 5 %
Lymphocytes Relative: 16 %
Lymphs Abs: 1.7 10*3/uL (ref 0.7–4.0)
MCH: 33.2 pg (ref 26.0–34.0)
MCHC: 31.3 g/dL (ref 30.0–36.0)
MCV: 106.3 fL — ABNORMAL HIGH (ref 80.0–100.0)
Monocytes Absolute: 1.1 10*3/uL — ABNORMAL HIGH (ref 0.1–1.0)
Monocytes Relative: 11 %
Neutro Abs: 6.7 10*3/uL (ref 1.7–7.7)
Neutrophils Relative %: 63 %
Platelets: 406 10*3/uL — ABNORMAL HIGH (ref 150–400)
RBC: 2.86 MIL/uL — ABNORMAL LOW (ref 4.22–5.81)
RDW: 15.1 % (ref 11.5–15.5)
WBC: 10.6 10*3/uL — ABNORMAL HIGH (ref 4.0–10.5)
nRBC: 0 % (ref 0.0–0.2)

## 2020-05-07 LAB — GLUCOSE, CAPILLARY
Glucose-Capillary: 63 mg/dL — ABNORMAL LOW (ref 70–99)
Glucose-Capillary: 92 mg/dL (ref 70–99)
Glucose-Capillary: 132 mg/dL — ABNORMAL HIGH (ref 70–99)
Glucose-Capillary: 87 mg/dL (ref 70–99)
Glucose-Capillary: 164 mg/dL — ABNORMAL HIGH (ref 70–99)

## 2020-05-07 LAB — BASIC METABOLIC PANEL
Anion gap: 9 (ref 5–15)
BUN: 33 mg/dL — ABNORMAL HIGH (ref 8–23)
CO2: 30 mmol/L (ref 22–32)
Calcium: 8.2 mg/dL — ABNORMAL LOW (ref 8.9–10.3)
Chloride: 99 mmol/L (ref 98–111)
Creatinine, Ser: 1.9 mg/dL — ABNORMAL HIGH (ref 0.61–1.24)
GFR calc Af Amer: 35 mL/min — ABNORMAL LOW (ref >60)
GFR calc non Af Amer: 31 mL/min — ABNORMAL LOW (ref >60)
Glucose, Bld: 74 mg/dL (ref 70–99)
Potassium: 4.4 mmol/L (ref 3.5–5.1)
Sodium: 138 mmol/L (ref 135–145)

## 2020-05-07 MED ORDER — INSULIN GLARGINE 100 UNIT/ML ~~LOC~~ SOLN
12.0000 [IU] | Freq: Two times a day (BID) | SUBCUTANEOUS | Status: DC
  Administered 2020-05-07 – 2020-05-12 (×10): 12 [IU] via SUBCUTANEOUS
  Filled 2020-05-07 (×11): qty 0.12

## 2020-05-07 MED ORDER — TRAMADOL HCL 50 MG PO TABS
50.0000 mg | ORAL_TABLET | Freq: Four times a day (QID) | ORAL | Status: DC
  Administered 2020-05-07 – 2020-05-12 (×20): 50 mg via ORAL
  Filled 2020-05-07 (×21): qty 1

## 2020-05-07 NOTE — Progress Notes (Signed)
Occupational Therapy Session Note  Patient Details  Name: [name] Small MRN: [id number] Date of Birth: 1928-05-01  Today's Date: 05/07/2020 OT Individual Time: 0700-0800 OT Individual Time Calculation (min): 60 min    Short Term Goals: Week 1:  OT Short Term Goal 1 (Week 1): Pt will perform slide board transfers to/from [facility] with Mod A while maintaining TDWB LLE OT Short Term Goal 1 - Progress (Week 1): Met OT Short Term Goal 2 (Week 1): Pt will perform clothing management via lateral leans on toilet with Mod A OT Short Term Goal 2 - Progress (Week 1): Progressing toward goal OT Short Term Goal 3 (Week 1): Pt will perform sit to stand while maintaining TDWB on LLE with Mod A OT Short Term Goal 3 - Progress (Week 1): Met OT Short Term Goal 4 (Week 1): Pt will perform LB dressing with Mod A using AE as needed OT Short Term Goal 4 - Progress (Week 1): Progressing toward goal  Skilled Therapeutic Interventions/Progress Updates:    OT session focused on BUE and BLE strengthening and functional endurance required for self-care tasks and transfers. Upon arrival, pt requesting to remain in bed due to L hip pain 7/10, soreness, and overall fatigue as he did not sleep well due to pain. RN aware and provided additional pain medication. Pt agreeable to complete exercises at bed level. Pt completed 5x12 of shoulder, chest, bicep, and tricep strengthening exercises using theraband and 4# DB. Also completed RLE hip flexion, heel slides, and abduction 4x 15 reps. Pt on room air for 30 minutes of exercise on this date with SpO2 consistently >95%. Pt dropped to 92% 2x quickly increasing with cues for deep breathing. Pt left supine in bed with all needs in reach and daughter present.   Therapy Documentation Precautions:  Precautions Precautions: Fall Precaution Comments: TDWB LLE Restrictions Weight Bearing Restrictions: Yes LLE Weight Bearing: Touchdown weight bearing Other Position/Activity  Restrictions: Pt has difficulty maintaining weight bearing. General:   Vital Signs: Oxygen Therapy SpO2: 95 % O2 Device: (S) Room Air (wears PRN at home, spo2 holding at 93% on RA) O2 Flow Rate (L/min): 1 L/min Pain: Pain Assessment Pain Scale: 0-10 Pain Score: 7  Pain Type: Acute pain Pain Location: Leg Pain Orientation: Left Pain Descriptors / Indicators: Aching;Discomfort;Dull Pain Frequency: Constant Pain Onset: On-going Pain Intervention(s): Medication (See eMAR) ADL: ADL Where Assessed-Upper Body Dressing: Chair Lower Body Dressing: Dependent Toileting: Dependent Toilet Transfer: Maximal assistance Toilet Transfer Method: Other (comment) (slide board with other rehab staff, [place] transfer) Vision   Perception    Praxis   Exercises:   Other Treatments:     Therapy/Group: Individual Therapy  [name] 05/07/2020, 9:24 AM

## 2020-05-07 NOTE — Progress Notes (Signed)
[place] PHYSICAL MEDICINE & REHABILITATION PROGRESS NOTE   Subjective/Complaints:  Pt has been Off O2 this AM since therapy- was on prn/night sometimes at home.   Pt a little sleepy since got oxycodone- had a bad night last night- due to meds being as needed/tramadol- will schedule it- so pt doesn't have to remember to ask for.   Daughter reports she's happy wife will be able to cath pt-and she's thinks her memory is good, but not perfect, but thinks if educated, will do well.    Cr down to 1.9- doing better  ROS:  Pt sleepy, however daughter denied all Sx's in ROS  Pt/daughter denies SOB, abd pain, CP, N/V/C/D, and vision changes   Objective:   No results found. Recent Labs    05/07/20 0515  WBC 10.6*  HGB 9.5*  HCT 30.4*  PLT 406*   Recent Labs    05/07/20 0515  NA 138  K 4.4  CL 99  CO2 30  GLUCOSE 74  BUN 33*  CREATININE 1.90*  CALCIUM 8.2*    Intake/Output Summary (Last 24 hours) at 05/07/2020 1334 Last data filed at 05/07/2020 1326 Gross per 24 hour  Intake 600 ml  Output 1500 ml  Net -900 ml     Physical Exam: Vital Signs Blood pressure (!) 123/55, pulse (!) 57, temperature 97.8 F (36.6 C), temperature source Oral, resp. rate 16, height 6\' 2"  (1.88 m), weight 97.8 kg, SpO2 95 %. Nursing note and vitals reviewed. Constitutional: sleeping, but woke briefly to say doing OK, daughter, spoke with me; at bedside, NAD HENT:  Conjugate gaze CV: borderline bradycardia- regular rhythm Respiratory: off O2 this AM; CTA B/L- no W/R/R- good air movement GI: Soft, NT, ND, (+)BS  Musculoskeletal:        General: Swelling present.     Cervical back: Normal range of motion.     Comments: Moderate edema left hip to knee. Mild edema L hip- incision looks great. No drainage or erythema Left transmet amputation site well healed. Looks great/scarred  Neurological: He is alert.  Motor: B/l UE: 5/5 proximal to distal RLE: HF: 2+/5, KE 3/5, ADF 4+/5 LLE: HF 2-/5,  KE 2+/5, ADF 3/5 HOH- dysphonia much improved- almost normal Skin: Skin is warm and dry. Small tear on buttock- not open.  Psychiatric: sleepy-    Assessment/Plan: 1. Functional deficits secondary to deficits with mobility, endurance, self-care secondary to left periprosthetic femur fracture, which require 3+ hours per day of interdisciplinary therapy in a comprehensive inpatient rehab setting.  Physiatrist is providing close team supervision and 24 hour management of active medical problems listed below.  Physiatrist and rehab team continue to assess barriers to discharge/monitor patient progress toward functional and medical goals  Care Tool:  Bathing  Bathing activity did not occur: Refused Body parts bathed by patient: Right arm, Left arm, Chest, Abdomen   Body parts bathed by helper: Right lower leg, Left lower leg     Bathing assist Assist Level: Maximal Assistance - Patient 24 - 49%     Upper Body Dressing/Undressing Upper body dressing   What is the patient wearing?: Pull over shirt    Upper body assist Assist Level: Set up assist    Lower Body Dressing/Undressing Lower body dressing    Lower body dressing activity did not occur: Refused What is the patient wearing?: Pants     Lower body assist Assist for lower body dressing: Maximal Assistance - Patient 25 - 49%  Toileting Toileting    Toileting assist Assist for toileting: Total Assistance - Patient < 25% (at [facility], able to lift buttocks for wife to perform hygiene) Assistive Device Comment: Urinal   Transfers Chair/bed transfer  Transfers assist     Chair/bed transfer assist level: Contact Guard/Touching assist     Locomotion Ambulation   Ambulation assist   Ambulation activity did not occur: Safety/medical concerns          Walk 10 feet activity   Assist  Walk 10 feet activity did not occur: Safety/medical concerns        Walk 50 feet activity   Assist Walk 50 feet with 2  turns activity did not occur: Safety/medical concerns         Walk 150 feet activity   Assist Walk 150 feet activity did not occur: Safety/medical concerns         Walk 10 feet on uneven surface  activity   Assist Walk 10 feet on uneven surfaces activity did not occur: Safety/medical concerns         Wheelchair     Assist Will patient use wheelchair at discharge?: Yes Type of Wheelchair: Manual Wheelchair activity did not occur: Refused  Wheelchair assist level: Supervision/Verbal cueing Max wheelchair distance: 100    Wheelchair 50 feet with 2 turns activity    Assist    Wheelchair 50 feet with 2 turns activity did not occur: Refused   Assist Level: Supervision/Verbal cueing   Wheelchair 150 feet activity     Assist  Wheelchair 150 feet activity did not occur: Safety/medical concerns       Blood pressure (!) 123/55, pulse (!) 57, temperature 97.8 F (36.6 C), temperature source Oral, resp. rate 16, height 6\' 2"  (1.88 m), weight 97.8 kg, SpO2 95 %.    Medical Problem List and Plan: 1.  Deficits with mobility, endurance, self-care secondary to left periprosthetic femur fracture.              -patient may not  shower             -ELOS/Goals: 10-14 days/Min A            -Continue CIR  -Needs to person assist for lower body dressing.  2.  Antithrombotics: -DVT/anticoagulation:  Mechanical: Sequential compression devices, below knee Bilateral lower extremities Will check dopplers.             -antiplatelet therapy: N/A 3. Pain Management: Has been on hydrocodone 5 mg every 8 hours. Changed to PRN as sedating patient and it makes more sense for him to take before therapy sessions.   6/21- pain for the first time controlled per PT this AM- con't prn Norco  6/24- pain in general worse- wants something in between pain meds prn- will add tramadol 50 mg q6 hours prn in between Norco dosing  6/25- pain MUCH improved with alternating tramadol and  oxy  6/27- will make tramadol 50 mg q6 hours- scheduled due to pt's poor memory             Monitor with increased exertion 4. Mood: LCSW to follow for evaluation and support.              -antipsychotic agents: N/a 5. Neuropsych: This patient is not fully capable of making decisions on his own behalf. 6. Skin/Wound Care: Monitor wound for healing.   6/19: Small skin tear on buttock- continue to monitor.  7. Fluids/Electrolytes/Nutrition: Monitor I/O.  8. HTN: Monitor BP tid-  continue Norvasc.             Monitor with increased mobility.   6/22- BP 110/50- con't regimen  6/25- well controlled- con't meds 9. T2DM with hyperglycemia: Monitor BS ac/hs.  Lantus increased to 15 units bid today. Now on Lantus bid with SSI for elevated BS.   6/18: CBG has been as low as 47. Decrease Lantus to 14U BID  6/19: CBG down to 69 this morning. Decrease Lantus to 13U BID  6/27- BGs 63-132 in last 24 hours- will decrease to 12 units, since sometimes sig. Elevated as well- due to renal fxn, don't want to add oral agents   CBG (last 3)  Recent Labs    05/07/20 0615 05/07/20 0650 05/07/20 1148  GLUCAP 63* 92 132*   6/22- when on higher dose of Lantus, pt's BGs dropped.              Monitor with increased mobility. 6/26- BGs overall controlled- con't regimen 10. COPD: Wean oxygen as tolerated. Continue Breo/Incruse.  6/26- used O2 intermittently at home prior- cannot seem to wean off O2- will d/w nursing.   11. Acute on chronic renal failure: Trending to baseline line? Will order PVRs to monitor voiding.             Creatinine trending upward on 6/17: Bolus 250cc  6/18: Down to 2.3  6/21- Cr 2.19- appears to be baseline around 2.19 and BUN 55  6/22- Cr 2.29- today- not sure if due to keflex- but is still on lower side  6/24- Cr down to 2.01- much improved- con't regimen  12. ABLA/Hematuria: Continues--will continue to monitor H/H. Off Plavix and Lovenox at this time.             Hgb trending  downward on 6/17. Up to 7.6 on 6/18.   6/21- no CBC done this AM- will order 13. Constipation: Had BM 6/19 with enema. Increased laxatives to colace TID and Senna HS  6/21- cleaned out yesterday 14. UTI: started keflex. Culture pending.   6/21- Sensitivities pending- is Klebsiella Oxytoca >100k  6/22- will change to Rocephin since WBC stil 16.6k  6/23- labs tomorrow- still requiring cathing- increased flomax to max dose yesterday.  6/24- WBC down to 14 from 16.6- will con't Rocephin- likely adding to urinary retention.   6/27- WBC down to 10.6- MUCH improved- con't rocephin x total of 7 days- til 6/29 15. Urinary retention  6/24- on max dose of Flomax- no rash since allergic to Sulfa-  6/25 -still requiring icaths  6/26- will have wife taught how to in/out cath since no voiding at this time- no Sx's of reaction.  6/27- no voiding still- will need f/u with Urology since not sure why not voiding- wife was clear was "slow voider" prior to admission     LOS: 11 days A FACE TO FACE EVALUATION WAS PERFORMED  Deb Loudin 05/07/2020, 1:34 PM

## 2020-05-07 NOTE — Progress Notes (Signed)
Provided education to pt and wife regarding I/O craterization. Provided written instructions as well as had wife perform return demo. Wife performed I/O cath with min cuing. Further education needs to be provided.

## 2020-05-07 NOTE — Progress Notes (Signed)
Pt wears O2 at home PRN, per Therapy, pt on RA for over 30 mins and spo2 held at 93%, pt denied SOB, no increased WOB. [place] left turned on and at bedside and pt instructed to put on if needed.

## 2020-05-07 NOTE — Progress Notes (Signed)
Wife performed I/O catheterization with min cuing. Continue eduction with wife. Pt tolerated well.

## 2020-05-08 ENCOUNTER — Inpatient Hospital Stay (HOSPITAL_COMMUNITY): Payer: Medicare Other | Admitting: Occupational Therapy

## 2020-05-08 ENCOUNTER — Inpatient Hospital Stay (HOSPITAL_COMMUNITY): Payer: Medicare Other

## 2020-05-08 DIAGNOSIS — M978XXD Periprosthetic fracture around other internal prosthetic joint, subsequent encounter: Secondary | ICD-10-CM

## 2020-05-08 LAB — CBC WITH DIFFERENTIAL/PLATELET
Abs Immature Granulocytes: 0.49 10*3/uL — ABNORMAL HIGH (ref 0.00–0.07)
Basophils Absolute: 0.1 10*3/uL (ref 0.0–0.1)
Basophils Relative: 1 %
Eosinophils Absolute: 0.3 10*3/uL (ref 0.0–0.5)
Eosinophils Relative: 4 %
HCT: 33 % — ABNORMAL LOW (ref 39.0–52.0)
Hemoglobin: 9.9 g/dL — ABNORMAL LOW (ref 13.0–17.0)
Immature Granulocytes: 5 %
Lymphocytes Relative: 15 %
Lymphs Abs: 1.4 10*3/uL (ref 0.7–4.0)
MCH: 32.2 pg (ref 26.0–34.0)
MCHC: 30 g/dL (ref 30.0–36.0)
MCV: 107.5 fL — ABNORMAL HIGH (ref 80.0–100.0)
Monocytes Absolute: 1 10*3/uL (ref 0.1–1.0)
Monocytes Relative: 11 %
Neutro Abs: 5.9 10*3/uL (ref 1.7–7.7)
Neutrophils Relative %: 64 %
Platelets: 383 10*3/uL (ref 150–400)
RBC: 3.07 MIL/uL — ABNORMAL LOW (ref 4.22–5.81)
RDW: 15.3 % (ref 11.5–15.5)
WBC: 9.1 10*3/uL (ref 4.0–10.5)
nRBC: 0 % (ref 0.0–0.2)

## 2020-05-08 LAB — BASIC METABOLIC PANEL
Anion gap: 9 (ref 5–15)
BUN: 29 mg/dL — ABNORMAL HIGH (ref 8–23)
CO2: 30 mmol/L (ref 22–32)
Calcium: 8.2 mg/dL — ABNORMAL LOW (ref 8.9–10.3)
Chloride: 99 mmol/L (ref 98–111)
Creatinine, Ser: 1.94 mg/dL — ABNORMAL HIGH (ref 0.61–1.24)
GFR calc Af Amer: 35 mL/min — ABNORMAL LOW (ref >60)
GFR calc non Af Amer: 30 mL/min — ABNORMAL LOW (ref >60)
Glucose, Bld: 119 mg/dL — ABNORMAL HIGH (ref 70–99)
Potassium: 4.5 mmol/L (ref 3.5–5.1)
Sodium: 138 mmol/L (ref 135–145)

## 2020-05-08 LAB — GLUCOSE, CAPILLARY
Glucose-Capillary: 68 mg/dL — ABNORMAL LOW (ref 70–99)
Glucose-Capillary: 83 mg/dL (ref 70–99)
Glucose-Capillary: 140 mg/dL — ABNORMAL HIGH (ref 70–99)
Glucose-Capillary: 121 mg/dL — ABNORMAL HIGH (ref 70–99)
Glucose-Capillary: 123 mg/dL — ABNORMAL HIGH (ref 70–99)

## 2020-05-08 MED ORDER — POLYETHYLENE GLYCOL 3350 17 G PO PACK
17.0000 g | PACK | Freq: Every day | ORAL | Status: DC
  Administered 2020-05-09 – 2020-05-12 (×4): 17 g via ORAL
  Filled 2020-05-08 (×4): qty 1

## 2020-05-08 NOTE — Progress Notes (Signed)
Patient's spouse is asking if there is any kind of ear wax removal we can get for him. He reports his ears "feel full of wax". Wife states he uses water pic at home to irrigate wax out. Nurse said she would write progress note so doctor could address in morning.  [place]

## 2020-05-08 NOTE — Progress Notes (Signed)
[place] PHYSICAL MEDICINE & REHABILITATION PROGRESS NOTE   Subjective/Complaints: Patient feels well this morning, very alert! Therapy and wife at bedside. Has not had BM in 2 days. Discussed that we can add Miralax to Senna/docusate to minimize need for suppository. Has also been drinking prune juice.   ROS:  Pt denies SOB, abd pain, CP, N/V/C/D, and vision changes   Objective:   No results found. Recent Labs    05/07/20 0515 05/08/20 0718  WBC 10.6* 9.1  HGB 9.5* 9.9*  HCT 30.4* 33.0*  PLT 406* 383   Recent Labs    05/07/20 0515 05/08/20 0718  NA 138 138  K 4.4 4.5  CL 99 99  CO2 30 30  GLUCOSE 74 119*  BUN 33* 29*  CREATININE 1.90* 1.94*  CALCIUM 8.2* 8.2*    Intake/Output Summary (Last 24 hours) at 05/08/2020 1044 Last data filed at 05/08/2020 0759 Gross per 24 hour  Intake 560 ml  Output 1125 ml  Net -565 ml     Physical Exam: Vital Signs Blood pressure (!) 127/55, pulse (!) 53, temperature (!) 97.5 F (36.4 C), temperature source Oral, resp. rate 15, height 6\' 2"  (1.88 m), weight 98.6 kg, SpO2 99 %. General: Alert and oriented x 3, No apparent distress HEENT: Head is normocephalic, atraumatic, PERRLA, EOMI, sclera anicteric, oral mucosa pink and moist, dentition intact, ext ear canals clear,  Neck: Supple without JVD or lymphadenopathy Heart: Reg rate and rhythm. No murmurs rubs or gallops Chest: CTA bilaterally without wheezes, rales, or rhonchi; no distress Abdomen: Soft, non-tender, non-distended, bowel sounds positive. Extremities: No clubbing, cyanosis, or edema. Pulses are 2+ Musculoskeletal:        General: Swelling present.     Cervical back: Normal range of motion.     Comments: Moderate edema left hip to knee. Mild edema L hip- incision looks great. No drainage or erythema. Sutures removed.  Left transmet amputation site well healed. Looks great/scarred  Neurological: He is alert.  Motor: B/l UE: 5/5 proximal to distal RLE: HF: 2+/5,  KE 3/5, ADF 4+/5 LLE: HF 2-/5, KE 2+/5, ADF 3/5 HOH- dysphonia much improved- almost normal Skin: Skin is warm and dry. Small tear on buttock- not open.  Psychiatric: Much more awake today!  Assessment/Plan: 1. Functional deficits secondary to deficits with mobility, endurance, self-care secondary to left periprosthetic femur fracture, which require 3+ hours per day of interdisciplinary therapy in a comprehensive inpatient rehab setting.  Physiatrist is providing close team supervision and 24 hour management of active medical problems listed below.  Physiatrist and rehab team continue to assess barriers to discharge/monitor patient progress toward functional and medical goals  Care Tool:  Bathing  Bathing activity did not occur: Refused Body parts bathed by patient: Right arm, Left arm, Chest, Abdomen   Body parts bathed by helper: Right lower leg, Left lower leg     Bathing assist Assist Level: Maximal Assistance - Patient 24 - 49%     Upper Body Dressing/Undressing Upper body dressing   What is the patient wearing?: Pull over shirt    Upper body assist Assist Level: Set up assist    Lower Body Dressing/Undressing Lower body dressing    Lower body dressing activity did not occur: Refused What is the patient wearing?: Pants     Lower body assist Assist for lower body dressing: Maximal Assistance - Patient 25 - 49%     Toileting Toileting    Toileting assist Assist for toileting: Total Assistance -  Patient < 25% Assistive Device Comment: Urinal   Transfers Chair/bed transfer  Transfers assist     Chair/bed transfer assist level: Contact Guard/Touching assist     Locomotion Ambulation   Ambulation assist   Ambulation activity did not occur: Safety/medical concerns          Walk 10 feet activity   Assist  Walk 10 feet activity did not occur: Safety/medical concerns        Walk 50 feet activity   Assist Walk 50 feet with 2 turns activity  did not occur: Safety/medical concerns         Walk 150 feet activity   Assist Walk 150 feet activity did not occur: Safety/medical concerns         Walk 10 feet on uneven surface  activity   Assist Walk 10 feet on uneven surfaces activity did not occur: Safety/medical concerns         Wheelchair     Assist Will patient use wheelchair at discharge?: Yes Type of Wheelchair: Manual Wheelchair activity did not occur: Refused  Wheelchair assist level: Supervision/Verbal cueing Max wheelchair distance: 100    Wheelchair 50 feet with 2 turns activity    Assist    Wheelchair 50 feet with 2 turns activity did not occur: Refused   Assist Level: Supervision/Verbal cueing   Wheelchair 150 feet activity     Assist  Wheelchair 150 feet activity did not occur: Safety/medical concerns       Blood pressure (!) 127/55, pulse (!) 53, temperature (!) 97.5 F (36.4 C), temperature source Oral, resp. rate 15, height 6\' 2"  (1.88 m), weight 98.6 kg, SpO2 99 %.    Medical Problem List and Plan: 1.  Deficits with mobility, endurance, self-care secondary to left periprosthetic femur fracture.              -patient may not  shower             -ELOS/Goals: 10-14 days/Min A            - Continue CIR  -Needs 2 person assist for lower body dressing.  2.  Antithrombotics: -DVT/anticoagulation:  Mechanical: Sequential compression devices, below knee Bilateral lower extremities Will check dopplers.             -antiplatelet therapy: N/A 3. Pain Management: Has been on hydrocodone 5 mg every 8 hours. Changed to PRN as sedating patient and it makes more sense for him to take before therapy sessions.   6/21- pain for the first time controlled per PT this AM- con't prn Norco  6/24- pain in general worse- wants something in between pain meds prn- will add tramadol 50 mg q6 hours prn in between Norco dosing  6/25- pain MUCH improved with alternating tramadol and oxy  6/27- will  make tramadol 50 mg q6 hours- scheduled due to pt's poor memory             Monitor with increased exertion 4. Mood: LCSW to follow for evaluation and support.              -antipsychotic agents: N/a 5. Neuropsych: This patient is not fully capable of making decisions on his own behalf. 6. Skin/Wound Care: Monitor wound for healing.   6/19: Small skin tear on buttock- continue to monitor.  7. Fluids/Electrolytes/Nutrition: Monitor I/O.  8. HTN: Monitor BP tid- continue Norvasc.             Monitor with increased mobility.   6/22- BP  110/50- con't regimen  6/25- well controlled- con't meds 9. T2DM with hyperglycemia: Monitor BS ac/hs.  Lantus increased to 15 units bid today. Now on Lantus bid with SSI for elevated BS.   6/18: CBG has been as low as 47. Decrease Lantus to 14U BID  6/19: CBG down to 69 this morning. Decrease Lantus to 13U BID  6/27- BGs 63-132 in last 24 hours- will decrease to 12 units, since sometimes sig. Elevated as well- due to renal fxn, don't want to add oral agents   CBG (last 3)  Recent Labs    05/07/20 2104 05/08/20 0629 05/08/20 0645  GLUCAP 164* 68* 83   6/22- when on higher dose of Lantus, pt's BGs dropped.              Monitor with increased mobility. 6/28: well controlled.  10. COPD: Wean oxygen as tolerated. Continue Breo/Incruse.  6/26- used O2 intermittently at home prior- cannot seem to wean off O2- will d/w nursing.   11. Acute on chronic renal failure: Trending to baseline line? Will order PVRs to monitor voiding.             Creatinine trending upward on 6/17: Bolus 250cc  6/18: Down to 2.3  6/21- Cr 2.19- appears to be baseline around 2.19 and BUN 55  6/22- Cr 2.29- today- not sure if due to keflex- but is still on lower side  6/24- Cr down to 2.01- much improved- con't regimen  6/28: Cr 1.94.   12. ABLA/Hematuria: Continues--will continue to monitor H/H. Off Plavix and Lovenox at this time.             Hgb trending downward on 6/17. Up to  7.6 on 6/18.   6/28: Hgb stable.  13. Constipation: Had BM 6/19 with enema. Increased laxatives to colace TID and Senna HS  6/21- cleaned out yesterday 14. UTI: started keflex. Culture pending.   6/21- Sensitivities pending- is Klebsiella Oxytoca >100k  6/22- will change to Rocephin since WBC stil 16.6k  6/23- labs tomorrow- still requiring cathing- increased flomax to max dose yesterday.  6/24- WBC down to 14 from 16.6- will con't Rocephin- likely adding to urinary retention.   6/27- WBC down to 10.6- MUCH improved- con't rocephin x total of 7 days- til 6/29  6/28: WBC stable 15. Urinary retention  6/24- on max dose of Flomax- no rash since allergic to Sulfa-  6/25 -still requiring icaths  6/26- will have wife taught how to in/out cath since no voiding at this time- no Sx's of reaction.  6/27- no voiding still- will need f/u with Urology since not sure why not voiding- wife was clear was "slow voider" prior to admission     LOS: 12 days A FACE TO FACE EVALUATION WAS [place] 05/08/2020, 10:43 AM

## 2020-05-08 NOTE — Progress Notes (Signed)
Occupational Therapy Session Note  Patient Details  Name: [name] Small MRN: [id number] Date of Birth: [birth year]  Today's Date: 05/08/2020 OT Individual Time: 1100-1155 OT Individual Time Calculation (min): 55 min    Short Term Goals: Week 1:  OT Short Term Goal 1 (Week 1): Pt will perform slide board transfers to/from [facility] with Mod A while maintaining TDWB LLE OT Short Term Goal 1 - Progress (Week 1): Met OT Short Term Goal 2 (Week 1): Pt will perform clothing management via lateral leans on toilet with Mod A OT Short Term Goal 2 - Progress (Week 1): Progressing toward goal OT Short Term Goal 3 (Week 1): Pt will perform sit to stand while maintaining TDWB on LLE with Mod A OT Short Term Goal 3 - Progress (Week 1): Met OT Short Term Goal 4 (Week 1): Pt will perform LB dressing with Mod A using AE as needed OT Short Term Goal 4 - Progress (Week 1): Progressing toward goal Week 2:  OT Short Term Goal 1 (Week 2): Pt will perform lateral transfers to/from [facility] with CGA while maintaining TDWB LLE OT Short Term Goal 2 (Week 2): Pt will perform clothing management via lateral leans on BSC with Min A OT Short Term Goal 3 (Week 2): Pt will perform sit to stand while maintaining TDWB on LLE with Min A OT Short Term Goal 4 (Week 2): Pt will perform LB dressing with Mod A using AE as needed  Skilled Therapeutic Interventions/Progress Updates:    Patient asleep in bed, easy to arouse, wife present for therapy session.  On 2L O2 via [place] (saturation 90-91% with and without activity checked t/o session)  Patient states that pain is under control.  Completed bed level AROM/AAROM LEs with light stretch.  Supine to sitting edge of bed with min a.  Completes sit pivot transfer bed to w/c CGA.  Sit to stand with bed rail for support of bilateral UEs with min A x 3 - able to maintain stance with TDWB L LE for 2-3 minutes each attempt - cues for upright posture and DBT.  Dependent w/c mobility to./from therapy gym  - completed UB ergometer 2 x 5 minutes.  Sit pivot back to bed with CGA/set up of w/c.  Sitting to supine with min A.  Able to roll in bed both right and left mod I.  Bed alarm set and call bell in reach at close of session.    Therapy Documentation Precautions:  Precautions Precautions: Fall Precaution Comments: TDWB LLE Restrictions Weight Bearing Restrictions: Yes LLE Weight Bearing: Touchdown weight bearing Other Position/Activity Restrictions: Pt has difficulty maintaining weight bearing.  Therapy/Group: Individual Therapy  Carlos Levering 05/08/2020, 7:38 AM

## 2020-05-08 NOTE — Progress Notes (Signed)
Physical Therapy Session Note  Patient Details  Name: [name] Small MRN: [id number] Date of Birth: 28-Sep-1928  Today's Date: 05/08/2020 PT Individual Time: 1345-1458 PT Individual Time Calculation (min): 73 min   Short Term Goals: Week 2:  PT Short Term Goal 1 (Week 2): STGs=LTGs due to ELOS  Skilled Therapeutic Interventions/Progress Updates:     PT received supine in bed and agreeable to therapy. Reports pain in LLE. Number not provided. PT provides repositioning and rest breaks as needed to manage pain symptoms. PT has discussion with pt's wife regarding pt sleeping in lift chair. PT educates that pt not yet at safe level of performing stand pivot transfers to transfer with wife to lift chair. PT problem solves with wife and demonstrates potential lateral transfer pt could use to get in lift chair.   Focus of session on sit to stand practice to improve safety, independence, and efficiency. Bed mobility with supervision and verbal cues on sequencing. Stand pivot to WC with RW and modA from PT at hips. WC transport to gym for time management. Pt perform lateral transfer to high low table with supervision. Pt then performs multiple sit to stand transfers from mat table in elevated position and decreasing elevation with each rep. PT provides verbal cues on hand placement and body mechanics. Pt performs transfer from elevated position with CGA and gradually requires minA as height decreases. Pt remains standing ~1 minute each rep with CGA and cues for increased hip and trunk extension. Total sit to stand x8 reps.  Pt performs lateral transfer back to [facility] with supervision. Pt self propels WC x100' with verbal cues for increased BUE extension for more efficient propulsion. Pt performed activity, holding 4lb bar with BUEs and hitting ball back to PT. Performed to increase activity tolerance and BUE and core strength. Pt self propels WC 160' back to room. Lateral transfer back to bed with supervision  and sit to supine with minA management of LLE. Pt left supine in bed with alarm intact and all needs within reach.   Therapy Documentation Precautions:  Precautions Precautions: Fall Precaution Comments: TDWB LLE Restrictions Weight Bearing Restrictions: Yes LLE Weight Bearing: Touchdown weight bearing Other Position/Activity Restrictions: Pt has difficulty maintaining weight bearing.    Therapy/Group: Individual Therapy  Breck Coons, PT, DPT 05/08/2020, 2:16 PM

## 2020-05-08 NOTE — Progress Notes (Signed)
Ortho Trauma Progress Note  Feeling well today. States had a good nights sleep  LLE: Incision clean and dry and sutures were removed. Swelling of leg that is appropriate. Neuro intact distally  Imaging from last weeks fall was stable  A/P L periprosthetic femur fracture s/p ORIF  Continue TDWB Will obtain x-rays prior to discharge Plan for follow up in 3 weeks for likely advancement of WBing  Shona Needles, MD [facility] [phone] (office) [email or web address]

## 2020-05-08 NOTE — Progress Notes (Signed)
Hypoglycemic Event  CBG: 68  Treatment: 4 oz juice/soda  Symptoms: None  Follow-up CBG: [id number] CBG Result:83  Possible Reasons for Event: unknown  Comments/MD notified:will monitor    Priti Consoli, [employer]

## 2020-05-08 NOTE — Progress Notes (Addendum)
Patient ID: [name] Small, male   DOB: 09/01/1928, 86 y.o.   MRN: [id number]  SW spoke with [facility] Marquita Laughlin ([phone]) covering for SW Heather Todd to inform on pt d/c date, provide updates on DME items: ramp and bariatric drop arm bedside commode. SW will need to fax urgent items request form for DME items (fax #[phone]). SW to fax over HH orders for HHPT/PT/CNA and updated clinicals to Dr. Jain (fax #[phone]). Pt likely to qualify for CNA needs. Will need to fax d/c summary when pt is ready for d/c.  SW faxed urgent items request form, and HH orders/clinicals to Dr. Jain.   *SW met with pt wife in room to provide updates on above. Pt wife preferred HHA is [facility]. SW sent referral to Tiffany/[facility] for HHPT/OT. SW waiting on follow-up.   , MSW, LCSWA Office: [phone] Cell: [phone] Fax: [phone] 

## 2020-05-08 NOTE — Progress Notes (Signed)
Occupational Therapy Session Note  Patient Details  Name: [name] Small MRN: [id number] Date of Birth: October 26, 1928  Today's Date: 05/08/2020 OT Individual Time: 2122-4825 OT Individual Time Calculation (min): 59 min    Short Term Goals: Week 2:  OT Short Term Goal 1 (Week 2): Pt will perform lateral transfers to/from [facility] with CGA while maintaining TDWB LLE OT Short Term Goal 2 (Week 2): Pt will perform clothing management via lateral leans on BSC with Min A OT Short Term Goal 3 (Week 2): Pt will perform sit to stand while maintaining TDWB on LLE with Min A OT Short Term Goal 4 (Week 2): Pt will perform LB dressing with Mod A using AE as needed  Skilled Therapeutic Interventions/Progress Updates:    Pt greeted at time of session resting in bed agreeable to OT and no c/o pain throughout, mild facial grimacing occasionally during transfers. Pt had BM in brief, bed level brief change with Min A to roll L and R and wife present and performed his hygiene/pericare, total assist for brief change at bed level. Bed mobility supine to sitting up at EOB with Min A only to manage LLE. Once EOB, pt noted to not be wearing O2 and vitals checked, dropped to 85% and with O2 returned to >90%. LB dressing sitting at EOB with therapist threading feet into pants, sit to stand Min A with RW while wife assists with donning pants over hips. Lateral transfers to/from [facility] with bed at height of bed at home, CGA to [facility] and Min A back to bed. Max A for clothing management but pt able to come up to partial stand at [facility] for wife to assist with clothing and hygiene. Family ed provided throughout for toileting and LB dressing techniques to utilize at home, wife receptive and with good carryover. Sit to supine Min A to manage LLE, call bell in reach, all needs met, alarm on.   Therapy Documentation Precautions:  Precautions Precautions: Fall Precaution Comments: TDWB LLE Restrictions Weight Bearing Restrictions: Yes LLE  Weight Bearing: Touchdown weight bearing Other Position/Activity Restrictions: Pt has difficulty maintaining weight bearing.     Therapy/Group: Individual Therapy  [name] 05/08/2020, 10:00 AM

## 2020-05-09 ENCOUNTER — Inpatient Hospital Stay (HOSPITAL_COMMUNITY): Payer: Medicare Other | Admitting: Occupational Therapy

## 2020-05-09 ENCOUNTER — Inpatient Hospital Stay (HOSPITAL_COMMUNITY): Payer: Medicare Other | Admitting: Physical Therapy

## 2020-05-09 ENCOUNTER — Inpatient Hospital Stay (HOSPITAL_COMMUNITY): Payer: Medicare Other

## 2020-05-09 LAB — GLUCOSE, CAPILLARY
Glucose-Capillary: 75 mg/dL (ref 70–99)
Glucose-Capillary: 94 mg/dL (ref 70–99)
Glucose-Capillary: 128 mg/dL — ABNORMAL HIGH (ref 70–99)
Glucose-Capillary: 185 mg/dL — ABNORMAL HIGH (ref 70–99)

## 2020-05-09 MED ORDER — CARBAMIDE PEROXIDE 6.5 % OT SOLN: 10.0000 [drp] | Freq: Two times a day (BID) | OTIC | Status: DC

## 2020-05-09 MED ORDER — CARBAMIDE PEROXIDE 6.5 % OT SOLN
10.0000 [drp] | Freq: Two times a day (BID) | OTIC | Status: DC
  Administered 2020-05-09 – 2020-05-11 (×5): 10 [drp] via OTIC
  Filled 2020-05-09 (×3): qty 15

## 2020-05-09 NOTE — Patient Care Conference (Signed)
Inpatient RehabilitationTeam Conference and Plan of Care Update Date: 05/09/2020   Time: 4:48 PM    Patient Name: [name] Small      Medical Record Number: [id number]  Date of Birth: 1928-04-07 Sex: Male         Room/Bed: 4W05C/4W05C-01 Payor Info: Payor: [employer] MEDICARE / Plan: [facility] MEDICARE / Product Type: *No Product type* /    Admit Date/Time:  04/26/2020  4:44 PM  Primary Diagnosis:  Periprosthetic hip fracture  Patient Active Problem List   Diagnosis Date Noted  . Adjustment reaction of late life   . Femur fracture, left ([place]) 04/26/2020  . Periprosthetic hip fracture 04/26/2020  . Acute kidney injury ([place])   . Closed left subtrochanteric femur fracture, initial encounter ([place])   . Acute blood loss anemia   . Gross hematuria   . Controlled type 2 diabetes mellitus with hyperglycemia, with long-term current use of insulin ([place])   . Benign essential HTN   . Post-operative pain   . Closed left hip fracture, initial encounter ([place]) 04/18/2020  . Anxiety 12/15/2019  . Laceration of right thumb without foreign body with damage to nail 12/15/2019  . Anemia 01/31/2017  . Hypoxia   . History of hip replacement 12/26/2016  . Status post lumbar laminectomy 12/26/2016  . S/p left hip fracture 12/26/2016  . Idiopathic chronic venous hypertension of left lower extremity with inflammation 11/07/2016  . Spinal stenosis of lumbar region with neurogenic claudication 10/18/2016  . Spinal stenosis of lumbar region 09/25/2016  . Fall 07/04/2016  . COPD (chronic obstructive pulmonary disease) ([place]) 06/10/2016  . Weakness 03/18/2016  . Critical lower limb ischemia 02/28/2016  . Peripheral arterial disease ([place]) 10/17/2015  . Edema 07/19/2015  . Claudication ([place]) 01/31/2015  . DOE (dyspnea on exertion) 01/31/2015  . Hyperlipidemia 01/31/2015  . HTN (hypertension) 01/31/2015  . Chronic diastolic CHF (congestive heart failure) ([place]) 01/21/2014  . Atrial fibrillation ([place])  12/16/2012  . Hyperkalemia 11/27/2011  . Bladder cancer ([place]) 09/22/2011  . Leg cramps 08/28/2011  . Cough 05/26/2011  . CAD (coronary artery disease) of artery bypass graft 05/09/2011  . Dyspnea on exertion 04/19/2011  . PSORIASIS, SCALP 10/25/2008  . Chronic kidney disease, stage III (moderate) 12/04/2007  . Type 2 diabetes mellitus with diabetic polyneuropathy ([place]) 05/21/2007  . DIVERTICULOSIS, COLON 05/21/2007  . BPH (benign prostatic hyperplasia) 05/21/2007    Expected Discharge Date: Expected Discharge Date: 05/12/20  Team Members Present: Physician leading conference: Dr. Courtney Heys Care Coodinator Present: Loralee Pacas, LCSWA;Jerrika Ledlow Creig Hines, RN, BSN, [place] Nurse Present: Dwaine Gale, RN PT Present: Tereasa Coop, PT OT Present: Lillia Corporal, OT PPS Coordinator present : Gunnar Fusi, SLP     Current Status/Progress Goal Weekly Team Focus  Bowel/Bladder   Continue I/O catherixation q4-6 hrs, incontinent/continent of bowels  Pt will be continent of bowel and able to urinate without I&O  QS/PRN assess B/B   Swallow/Nutrition/ Hydration             ADL's   UB ADLs supervision, lateral transfers to wheelchair and BSC CGA-Min, LB dressing Max/total, Min for sit to stand, difficulty maintaining WB status on LLE, wife able to assist with LB ADLs when pt is in standing/partial stand  Supervision overall but Min for LB  sit to stands, transfers, clothing management on [facility]: Lambert Rhonda W   Mobility   supervision to occasoinal minA for bed mobility, minA sit to stand, modA stand pivot transfers, supevision lateral transfers  supervision to CGA for transfers, 100'  with WC  DC prep, stand pivot transfers, BLE strength   Communication             Safety/Cognition/ Behavioral Observations            Pain   Has schedule Tramadol Q 6 hrs for pain and Hydrocodone prn for left hip pain  pain 3/10  QS/PRN assess and evaluate medications   Skin   s/p surgical left hip open air, no drainage   Skin will remain intact. Surgical wound will heal  QS/PRN assess    Rehab Goals Patient on target to meet rehab goals: Yes *See Care Plan and progress notes for long and short-term goals.     Barriers to Discharge  Current Status/Progress Possible Resolutions Date Resolved   Nursing                  PT                    OT                  SLP                Care Coordinator Decreased caregiver support;Lack of/limited family support              Discharge Planning/Teaching Needs:  D/c to home with his wife who will provide 24/7 care  Family education as recommended by therapy   Team Discussion:  Continent bowel with occasional episodes incontinence. I&O caths q 6 hrs for bladder. Skin tears to RUE. Pain controlled. BSC transfers big focus through discharge. Lateral transfers with WC, TDB. Requesting drop arm BSC through [place], ramp for home through [place]. Pt has aide through [place].  Revisions to Treatment Plan:  none    Medical Summary Current Status: LBM tyesterday am- caths q6 hours; few blood clots in urine this AM; skin tears RUE; is [facility]! Weekly Focus/Goal: OT- needs drop Arm BSC- needs a lot A for LB- wife can help; focus on wife helping; family ed  Barriers to Discharge: Decreased family/caregiver support;Home enviroment access/layout;New oxygen;Weight bearing restrictions;Neurogenic Bowel & Bladder;Wound care  Barriers to Discharge Comments: requested ramp and bariatric BSC at home- d/c friday- has aide Possible Resolutions to Barriers: PT- needs ramp put in; does well with lateral transfers and std pivot transfers- TDWB- can stand 1-2 minutes   Continued Need for Acute Rehabilitation Level of Care: The patient requires daily medical management by a physician with specialized training in physical medicine and rehabilitation for the following reasons: Direction of a multidisciplinary physical rehabilitation program to maximize functional independence : Yes Medical management of  patient stability for increased activity during participation in an intensive rehabilitation regime.: Yes Analysis of laboratory values and/or radiology reports with any subsequent need for medication adjustment and/or medical intervention. : Yes   I attest that I was present, lead the team conference, and concur with the assessment and plan of the team.   Cristi Loron 05/09/2020, 4:48 PM

## 2020-05-09 NOTE — Progress Notes (Signed)
Occupational Therapy Session Note  Patient Details  Name: [name] Small MRN: [id number] Date of Birth: 19-Jun-1928  Today's Date: 05/09/2020 OT Individual Time: 6314-9702 OT Individual Time Calculation (min): 58 min    Short Term Goals: Week 2:  OT Short Term Goal 1 (Week 2): Pt will perform lateral transfers to/from [name] with CGA while maintaining TDWB LLE OT Short Term Goal 2 (Week 2): Pt will perform clothing management via lateral leans on BSC with Min A OT Short Term Goal 3 (Week 2): Pt will perform sit to stand while maintaining TDWB on LLE with Min A OT Short Term Goal 4 (Week 2): Pt will perform LB dressing with Mod A using AE as needed  Skilled Therapeutic Interventions/Progress Updates:    Pt greeted at time of session supine in bed with wife in room agreeable to OT session to address [name] transfers in prep for home. Bed mobility supine to sit with Min A only to manage LLE but required less help compared to other sessions. Once sitting EOB, LB dressing for shorts total assist with pt able to briefly static stand for wife to don over hips. Lateral transfer from bed to wheelchair with CGA with instructions for wife to ensure stability of wheelchair to prevent fals. Pt practiced locking/unlocking brakes and removing armrests. Wheelchair to [name] with CGA-Min A only to manage LLE to prevent weight bearing but otherwise pt able to complete. Ed for wife on placement of BSC against surface/wall for stability. Attempted to have the pt perform lateral leans to doff/don LB clothing but unable to perform, able to static partial stand for wife to complete. Lateral transfer back to wheelchair with CGA and pt up in chair with alarm on, call bell in reach, all needs met. Collaboration with wife and pt regarding home set up, sleeping arrangement in recliner, and set up for [name] at home. O2 checked throughout session and dropped to 76% improved to 86-90% with pursed lip breathing.   Therapy  Documentation Precautions:  Precautions Precautions: Fall Precaution Comments: TDWB LLE Restrictions Weight Bearing Restrictions: Yes LLE Weight Bearing: Touchdown weight bearing Other Position/Activity Restrictions: Pt has difficulty maintaining weight bearing.    Therapy/Group: Individual Therapy  [name] 05/09/2020, 8:59 AM

## 2020-05-09 NOTE — Plan of Care (Signed)
  Problem: Consults Goal: RH GENERAL PATIENT EDUCATION Description: See Patient Education module for education specifics. Outcome: Progressing   Problem: RH BOWEL ELIMINATION Goal: RH STG MANAGE BOWEL WITH ASSISTANCE Description: STG Manage Bowel with Min Assistance. Outcome: Progressing   Problem: RH BLADDER ELIMINATION Goal: RH STG MANAGE BLADDER WITH ASSISTANCE Description: STG Manage Bladder With Min Assistance Outcome: Progressing   Problem: RH SKIN INTEGRITY Goal: RH STG SKIN FREE OF INFECTION/BREAKDOWN Description: No new breakdown with min assist  Outcome: Progressing Goal: RH STG ABLE TO PERFORM INCISION/WOUND CARE W/ASSISTANCE Description: STG Able To Perform Incision/Wound Care With [employer]. Outcome: Progressing   Problem: RH SAFETY Goal: RH STG ADHERE TO SAFETY PRECAUTIONS W/ASSISTANCE/DEVICE Description: STG Adhere to Safety Precautions With Min Assistance/Device. Outcome: Progressing   Problem: RH PAIN MANAGEMENT Goal: RH STG PAIN MANAGED AT OR BELOW PT'S PAIN GOAL Description: < 3 out of 10.  Outcome: Progressing

## 2020-05-09 NOTE — Progress Notes (Signed)
Physical Therapy Session Note  Patient Details  Name: [name] Small MRN: [id number] Date of Birth: [birth year]  Today's Date: 05/09/2020 PT Individual Time: 1300-1355 PT Individual Time Calculation (min): 55 min   Short Term Goals: Week 2:  PT Short Term Goal 1 (Week 2): STGs=LTGs due to ELOS  Skilled Therapeutic Interventions/Progress Updates:     Pt received supine in bed and agreeable to therapy. Reports pain in LLE. Number not provided. PT provides repositioning and rest breaks as needed to manage pain symptoms.   Supine to sit with verbal cues on hand placement sequencing. Pt uses leg lifter to assist with LLE. Pt perform sit to stand from slightly elevated bed with minA and stand pivot to WC with RW and minA with cues on heel raise to assist with pivoting and positioning for safety. WC transport to gym for time management.  Pt performs UE ergometer for strengthening and endurance training. Workload of 5.0 for 5 minutes in anterior direction and 5 minutes in posterior direction.   Pt performs squat pivot transfer from WC<>car with minA for hip guidance and management of LLE. WC transport to gym for time management.  Pt performs 3x10 reps seated triceps press ups using elevated hand platforms. For final set PT places RLE on exercise step to increase challenge.   Lateral transfer from mat>WC>bed with supervision. Sit to supine with supervision and use of leg lifter. Pt left supine in bed with alarm intact and all needs within reach.  Therapy Documentation Precautions:  Precautions Precautions: Fall Precaution Comments: TDWB LLE Restrictions Weight Bearing Restrictions: Yes LLE Weight Bearing: Touchdown weight bearing Other Position/Activity Restrictions: Pt has difficulty maintaining weight bearing.   Therapy/Group: Individual Therapy  Breck Coons, PT, DPT 05/09/2020, 3:59 PM

## 2020-05-09 NOTE — Progress Notes (Signed)
Occupational Therapy Session Note  Patient Details  Name: [name] Small MRN: [id number] Date of Birth: 23-Oct-1928  Today's Date: 05/09/2020 OT Individual Time: 1020-1100 OT Individual Time Calculation (min): 40 min    Short Term Goals: Week 1:  OT Short Term Goal 1 (Week 1): Pt will perform slide board transfers to/from [facility] with Mod A while maintaining TDWB LLE OT Short Term Goal 1 - Progress (Week 1): Met OT Short Term Goal 2 (Week 1): Pt will perform clothing management via lateral leans on toilet with Mod A OT Short Term Goal 2 - Progress (Week 1): Progressing toward goal OT Short Term Goal 3 (Week 1): Pt will perform sit to stand while maintaining TDWB on LLE with Mod A OT Short Term Goal 3 - Progress (Week 1): Met OT Short Term Goal 4 (Week 1): Pt will perform LB dressing with Mod A using AE as needed OT Short Term Goal 4 - Progress (Week 1): Progressing toward goal Week 2:  OT Short Term Goal 1 (Week 2): Pt will perform lateral transfers to/from [facility] with CGA while maintaining TDWB LLE OT Short Term Goal 2 (Week 2): Pt will perform clothing management via lateral leans on BSC with Min A OT Short Term Goal 3 (Week 2): Pt will perform sit to stand while maintaining TDWB on LLE with Min A OT Short Term Goal 4 (Week 2): Pt will perform LB dressing with Mod A using AE as needed  Skilled Therapeutic Interventions/Progress Updates:    Pt received in wc with spouse in the room requesting to get into bed.  Pt agreeable to trying some AE to learn how to don shorts more independently.  Pt provided with a reacher and he needed mod cues initially on how to use to pull over L foot.   Pt then was able to repeat demonstrate 2x with no cues.  Pt practiced donning over L foot and then his R foot.    His wife stated her 2 greatest concerns were getting in and out of bed and on and off the lift recliner. Pt was able to complete squat pivot to bed with cues to maintain TDWB on LLE and close S.  He did  need A to lift LLE onto bed.  Provided pt with a leg lifter and pt practiced moving L leg in bed with abd/add and knee flex/ext.  Suggested to pt that he practice using leg lifter with PT this afternoon with getting in and out of bed.  Pt seemed to enjoy using it to be able to move his leg on his own for  PROM.  Recommended pt complete several reps of those movement patterns before getting out of bed in the morning.    Pt resting in bed with all needs met and bed alarm on.   Therapy Documentation Precautions:  Precautions Precautions: Fall Precaution Comments: TDWB LLE Restrictions Weight Bearing Restrictions: Yes LLE Weight Bearing: Touchdown weight bearing Other Position/Activity Restrictions: Pt has difficulty maintaining weight bearing.    Vital Signs: Oxygen Therapy SpO2: 98 % O2 Device: Nasal Cannula O2 Flow Rate (L/min): 2 L/min Pain:  Pt stated he has no pain at rest but does with activity    Therapy/Group: Individual Therapy  [place] 05/09/2020, 10:04 AM

## 2020-05-09 NOTE — Progress Notes (Signed)
Physical Therapy Session Note  Patient Details  Name: [name] Small MRN: [id number] Date of Birth: March 06, 1928  Today's Date: 05/09/2020 PT Individual Time: 1500-1525 PT Individual Time Calculation (min): 25 min   Short Term Goals: Week 2:  PT Short Term Goal 1 (Week 2): STGs=LTGs due to ELOS  Skilled Therapeutic Interventions/Progress Updates:    Pt received seated in bed, agreeable with encouragement to participate in therapy session. Pt reports 7/10 pain in his LLE, declines intervention. Semi-reclined to sitting EOB with HOB elevated, use of bedrail, and use of leg lifter for LLE management at Supervision level. Once seated EOB pt declines any OOB mobility. Seated BLE strengthening therex x 10-15 reps with AAROM from therapist and/or leg lifter for LLE management: marches, LAQ, HS curls (resisted with GTB for RLE), hip add squeeze. Pt returned to supine in bed at Supervision level with use of leg lifter. Supine BLE strengthening therex: heel slides, hip abd with AAROM for LLE x 10-15 reps. Pt on 2L O2 during session, SpO2 remains at 90% and above with activity. Pt left semi-reclined in bed with needs in reach, bed alarm in place at end of session.  Therapy Documentation Precautions:  Precautions Precautions: Fall Precaution Comments: TDWB LLE Restrictions Weight Bearing Restrictions: Yes LLE Weight Bearing: Touchdown weight bearing Other Position/Activity Restrictions: Pt has difficulty maintaining weight bearing.    Therapy/Group: Individual Therapy   Excell Seltzer, PT, DPT  05/09/2020, 3:30 PM

## 2020-05-09 NOTE — Progress Notes (Signed)
Daughters is requesting permission for Friday discharge if son in law can assist with transferring patient from room to car due to wife and daughters physically not able, will need assistance

## 2020-05-09 NOTE — Progress Notes (Signed)
[place] PHYSICAL MEDICINE & REHABILITATION PROGRESS NOTE   Subjective/Complaints:   Pt reports doing well- pain getting better almost daily- having problems hearing- thinks ears full of wax- pain on and off, but better- No voiding yet- wife is doing in/out caths- discussed needs to cath at least 4x/day- depending on intake- if drinks more, might need 5x/day- can go 6-7.5 hours at night without a cath, but that's the max at home- was very clear about this.     ROS:   Pt denies SOB, abd pain, CP, N/V/C/D, and vision changes   Objective:   No results found. Recent Labs    05/07/20 0515 05/08/20 0718  WBC 10.6* 9.1  HGB 9.5* 9.9*  HCT 30.4* 33.0*  PLT 406* 383   Recent Labs    05/07/20 0515 05/08/20 0718  NA 138 138  K 4.4 4.5  CL 99 99  CO2 30 30  GLUCOSE 74 119*  BUN 33* 29*  CREATININE 1.90* 1.94*  CALCIUM 8.2* 8.2*    Intake/Output Summary (Last 24 hours) at 05/09/2020 0847 Last data filed at 05/09/2020 0600 Gross per 24 hour  Intake 444 ml  Output 1711 ml  Net -1267 ml     Physical Exam: Vital Signs Blood pressure 124/60, pulse (!) 52, temperature 98.5 F (36.9 C), resp. rate 18, height 6\' 2"  (1.88 m), weight 95.7 kg, SpO2 98 %. General: sitting up in bedside chair, appropriate, wearing 2L O2, NAD HEENT: conjugate gaze Heart: bradycardic, regular rhythm Chest: CTA B/L- no W/R/R- good air movement Abdomen: Soft, NT, ND, (+)BS  Extremities: No clubbing, cyanosis, or edema. Pulses are 2+ Musculoskeletal:        General: Swelling present.     Cervical back: Normal range of motion.     Comments: Moderate edema left hip to knee. Mild edema L hip- incision looks great. No drainage or erythema. Sutures removed.  Left transmet amputation site well healed. Looks great/scarred  Neurological: He is alert.  Motor: B/l UE: 5/5 proximal to distal RLE: HF: 2+/5, KE 3/5, ADF 4+/5 LLE: HF 2-/5, KE 2+/5, ADF 3/5 HOH- dysphonia worse again his AM- a little  hoarse Skin: Skin is warm and dry. Small tear on buttock- not open.  Psychiatric: much more awake  Assessment/Plan: 1. Functional deficits secondary to deficits with mobility, endurance, self-care secondary to left periprosthetic femur fracture, which require 3+ hours per day of interdisciplinary therapy in a comprehensive inpatient rehab setting.  Physiatrist is providing close team supervision and 24 hour management of active medical problems listed below.  Physiatrist and rehab team continue to assess barriers to discharge/monitor patient progress toward functional and medical goals  Care Tool:  Bathing  Bathing activity did not occur: Refused Body parts bathed by patient: Right arm, Left arm, Chest, Abdomen   Body parts bathed by helper: Right lower leg, Left lower leg     Bathing assist Assist Level: Maximal Assistance - Patient 24 - 49%     Upper Body Dressing/Undressing Upper body dressing   What is the patient wearing?: Pull over shirt    Upper body assist Assist Level: Set up assist    Lower Body Dressing/Undressing Lower body dressing    Lower body dressing activity did not occur: Refused What is the patient wearing?: Pants     Lower body assist Assist for lower body dressing: Maximal Assistance - Patient 25 - 49%     Toileting Toileting    Toileting assist Assist for toileting: Total Assistance -  Patient < 25% Assistive Device Comment: Urinal   Transfers Chair/bed transfer  Transfers assist     Chair/bed transfer assist level: Supervision/Verbal cueing     Locomotion Ambulation   Ambulation assist   Ambulation activity did not occur: Safety/medical concerns          Walk 10 feet activity   Assist  Walk 10 feet activity did not occur: Safety/medical concerns        Walk 50 feet activity   Assist Walk 50 feet with 2 turns activity did not occur: Safety/medical concerns         Walk 150 feet activity   Assist Walk 150  feet activity did not occur: Safety/medical concerns         Walk 10 feet on uneven surface  activity   Assist Walk 10 feet on uneven surfaces activity did not occur: Safety/medical concerns         Wheelchair     Assist Will patient use wheelchair at discharge?: Yes Type of Wheelchair: Manual Wheelchair activity did not occur: Refused  Wheelchair assist level: Supervision/Verbal cueing Max wheelchair distance: 170'    Wheelchair 50 feet with 2 turns activity    Assist    Wheelchair 50 feet with 2 turns activity did not occur: Refused   Assist Level: Supervision/Verbal cueing   Wheelchair 150 feet activity     Assist  Wheelchair 150 feet activity did not occur: Safety/medical concerns   Assist Level: Supervision/Verbal cueing   Blood pressure 124/60, pulse (!) 52, temperature 98.5 F (36.9 C), resp. rate 18, height 6\' 2"  (1.88 m), weight 95.7 kg, SpO2 98 %.    Medical Problem List and Plan: 1.  Deficits with mobility, endurance, self-care secondary to left periprosthetic femur fracture.              -patient may not  shower             -ELOS/Goals: 10-14 days/Min A            - Continue CIR  -Needs 2 person assist for lower body dressing.  2.  Antithrombotics: -DVT/anticoagulation:  Mechanical: Sequential compression devices, below knee Bilateral lower extremities Will check dopplers.             -antiplatelet therapy: N/A 3. Pain Management: Has been on hydrocodone 5 mg every 8 hours. Changed to PRN as sedating patient and it makes more sense for him to take before therapy sessions.   6/21- pain for the first time controlled per PT this AM- con't prn Norco  6/24- pain in general worse- wants something in between pain meds prn- will add tramadol 50 mg q6 hours prn in between [place] dosing  6/25- pain MUCH improved with alternating tramadol and oxy  6/27- will make   6/29- pain improving somewhat- but still "on and off"             Monitor with  increased exertion 4. Mood: LCSW to follow for evaluation and support.              -antipsychotic agents: N/a 5. Neuropsych: This patient is not fully capable of making decisions on his own behalf. 6. Skin/Wound Care: Monitor wound for healing.   6/19: Small skin tear on buttock- continue to monitor.  7. Fluids/Electrolytes/Nutrition: Monitor I/O.  8. HTN: Monitor BP tid- continue Norvasc.             Monitor with increased mobility.   6/22- BP 110/50- con't regimen  6/25- well controlled- con't meds 9. T2DM with hyperglycemia: Monitor BS ac/hs.  Lantus increased to 15 units bid today. Now on Lantus bid with SSI for elevated BS.   6/18: CBG has been as low as 47. Decrease Lantus to 14U BID  6/19: CBG down to 69 this morning. Decrease Lantus to 13U BID  6/27- BGs 63-132 in last 24 hours- will decrease to 12 units, since sometimes sig. Elevated as well- due to renal fxn, don't want to add oral agents  6/29- BGs 75-140- con't meds   CBG (last 3)  Recent Labs    05/08/20 1654 05/08/20 2058 05/09/20 0633  GLUCAP 121* 123* 75   6/22- when on higher dose of Lantus, pt's BGs dropped.              Monitor with increased mobility. 6/28: well controlled.  10. COPD: Wean oxygen as tolerated. Continue Breo/Incruse.  6/26- used O2 intermittently at home prior- cannot seem to wean off O2- will d/w nursing.  6/29- d/w nursing to wean O2 - if possible- per PT dropped to 85% off O2 in gym- might need to go home on L2   11. Acute on chronic renal failure: Trending to baseline line? Will order PVRs to monitor voiding.             Creatinine trending upward on 6/17: Bolus 250cc  6/18: Down to 2.3  6/21- Cr 2.19- appears to be baseline around 2.19 and BUN 55  6/22- Cr 2.29- today- not sure if due to keflex- but is still on lower side  6/24- Cr down to 2.01- much improved- con't regimen  6/28: Cr 1.94.   12. ABLA/Hematuria: Continues--will continue to monitor H/H. Off Plavix and Lovenox at this time.              Hgb trending downward on 6/17. Up to 7.6 on 6/18.   6/28: Hgb stable.  13. Constipation: Had BM 6/19 with enema. Increased laxatives to colace TID and Senna HS  6/21- cleaned out yesterday 14. UTI -Klebiella Oxytoca   6/29- off Rocephin now 15. Urinary retention  6/24- on max dose of Flomax- no rash since allergic to Sulfa-  6/25 -still requiring icaths  6/26- will have wife taught how to in/out cath since no voiding at this time- no Sx's of reaction.  6/27- no voiding still- will need f/u with Urology since not sure why not voiding- wife was clear was "slow voider" prior to admission   6/29- still not voiding- on max dose Flomax    LOS: 13 days A FACE TO FACE EVALUATION WAS PERFORMED  Audriana Aldama 05/09/2020, 8:47 AM

## 2020-05-10 ENCOUNTER — Inpatient Hospital Stay (HOSPITAL_COMMUNITY): Payer: Medicare Other | Admitting: Occupational Therapy

## 2020-05-10 ENCOUNTER — Inpatient Hospital Stay (HOSPITAL_COMMUNITY): Payer: Medicare Other

## 2020-05-10 ENCOUNTER — Inpatient Hospital Stay (HOSPITAL_COMMUNITY): Payer: Medicare Other | Admitting: Physical Therapy

## 2020-05-10 DIAGNOSIS — R339 Retention of urine, unspecified: Secondary | ICD-10-CM | POA: Diagnosis present

## 2020-05-10 LAB — GLUCOSE, CAPILLARY
Glucose-Capillary: 79 mg/dL (ref 70–99)
Glucose-Capillary: 103 mg/dL — ABNORMAL HIGH (ref 70–99)
Glucose-Capillary: 157 mg/dL — ABNORMAL HIGH (ref 70–99)
Glucose-Capillary: 211 mg/dL — ABNORMAL HIGH (ref 70–99)
Glucose-Capillary: 246 mg/dL — ABNORMAL HIGH (ref 70–99)

## 2020-05-10 NOTE — Progress Notes (Signed)
[facility] PHYSICAL MEDICINE & REHABILITATION PROGRESS NOTE   Subjective/Complaints:   Pt reports pain is about the same.  LBM last night- scared constipation would keep him from going home on time.   Dropped 1 hearing aid, so down to 1.  Asked for ears to be assessed- will ask PA, since wants to see if increased hearing problems is due to wax.    ROS:   Pt denies SOB, abd pain, CP, N/V/C/D, and vision changes   Objective:   No results found. Recent Labs    05/08/20 0718  WBC 9.1  HGB 9.9*  HCT 33.0*  PLT 383   Recent Labs    05/08/20 0718  NA 138  K 4.5  CL 99  CO2 30  GLUCOSE 119*  BUN 29*  CREATININE 1.94*  CALCIUM 8.2*    Intake/Output Summary (Last 24 hours) at 05/10/2020 1333 Last data filed at 05/10/2020 0900 Gross per 24 hour  Intake 674 ml  Output 1450 ml  Net -776 ml     Physical Exam: Vital Signs Blood pressure 128/63, pulse 89, temperature 97.9 F (36.6 C), resp. rate 16, height 6\' 2"  (1.88 m), weight 95.7 kg, SpO2 95 %. General: laying in bed- wife at bedside; appropriate, on 2L O2, NAD HEENT: conjugate gaze Heart: RRR Chest: CTA B/L- no W/R/R- good air movement Abdomen: Soft, NT, ND, (+)BS   Extremities: No clubbing, cyanosis, or edema. Pulses are 2+ Musculoskeletal:        General: Swelling present.     Cervical back: Normal range of motion.     Comments: Moderate edema left hip to knee. Mild edema L hip- incision looks great. No drainage or erythema. Sutures removed.  Left transmet amputation site well healed. Looks great/scarred  Neurological: He is alert.  Motor: B/l UE: 5/5 proximal to distal RLE: HF: 2+/5, KE 3/5, ADF 4+/5 LLE: HF 2-/5, KE 2+/5, ADF 3/5 HOH- worse than normal since only wearing 1 hearing aid - a little hoarse stil Skin: Skin is warm and dry. Small tear on buttock- not open.  Psychiatric: more awake during exam  Assessment/Plan: 1. Functional deficits secondary to deficits with mobility, endurance,  self-care secondary to left periprosthetic femur fracture, which require 3+ hours per day of interdisciplinary therapy in a comprehensive inpatient rehab setting.  Physiatrist is providing close team supervision and 24 hour management of active medical problems listed below.  Physiatrist and rehab team continue to assess barriers to discharge/monitor patient progress toward functional and medical goals  Care Tool:  Bathing  Bathing activity did not occur: Refused Body parts bathed by patient: Right arm, Left arm, Chest, Abdomen   Body parts bathed by helper: Right lower leg, Left lower leg     Bathing assist Assist Level: Maximal Assistance - Patient 24 - 49%     Upper Body Dressing/Undressing Upper body dressing   What is the patient wearing?: Pull over shirt    Upper body assist Assist Level: Set up assist    Lower Body Dressing/Undressing Lower body dressing    Lower body dressing activity did not occur: Refused What is the patient wearing?: Pants     Lower body assist Assist for lower body dressing: Maximal Assistance - Patient 25 - 49%     Toileting Toileting    Toileting assist Assist for toileting: Total Assistance - Patient < 25% Assistive Device Comment: Urinal   Transfers Chair/bed transfer  Transfers assist     Chair/bed transfer assist level: Supervision/Verbal cueing  Locomotion Ambulation   Ambulation assist   Ambulation activity did not occur: Safety/medical concerns          Walk 10 feet activity   Assist  Walk 10 feet activity did not occur: Safety/medical concerns        Walk 50 feet activity   Assist Walk 50 feet with 2 turns activity did not occur: Safety/medical concerns         Walk 150 feet activity   Assist Walk 150 feet activity did not occur: Safety/medical concerns         Walk 10 feet on uneven surface  activity   Assist Walk 10 feet on uneven surfaces activity did not occur: Safety/medical  concerns         Wheelchair     Assist Will patient use wheelchair at discharge?: Yes Type of Wheelchair: Manual Wheelchair activity did not occur: Refused  Wheelchair assist level: Supervision/Verbal cueing Max wheelchair distance: 170'    Wheelchair 50 feet with 2 turns activity    Assist    Wheelchair 50 feet with 2 turns activity did not occur: Refused   Assist Level: Supervision/Verbal cueing   Wheelchair 150 feet activity     Assist  Wheelchair 150 feet activity did not occur: Safety/medical concerns   Assist Level: Supervision/Verbal cueing   Blood pressure 128/63, pulse 89, temperature 97.9 F (36.6 C), resp. rate 16, height 6\' 2"  (1.88 m), weight 95.7 kg, SpO2 95 %.    Medical Problem List and Plan: 1.  Deficits with mobility, endurance, self-care secondary to left periprosthetic femur fracture.              -patient may not  shower             -ELOS/Goals: 10-14 days/Min A            - Continue CIR  -Needs 2 person assist for lower body dressing.  2.  Antithrombotics: -DVT/anticoagulation:  Mechanical: Sequential compression devices, below knee Bilateral lower extremities Will check dopplers.             -antiplatelet therapy: N/A 3. Pain Management: Has been on hydrocodone 5 mg every 8 hours. Changed to PRN as sedating patient and it makes more sense for him to take before therapy sessions.   6/21- pain for the first time controlled per PT this AM- con't prn Norco  6/24- pain in general worse- wants something in between pain meds prn- will add tramadol 50 mg q6 hours prn in between Norco dosing  6/25- pain MUCH improved with alternating tramadol and oxy  6/30- con't meds- pain variable, but overall controlled             Monitor with increased exertion 4. Mood: LCSW to follow for evaluation and support.              -antipsychotic agents: N/a 5. Neuropsych: This patient is not fully capable of making decisions on his own behalf. 6. Skin/Wound  Care: Monitor wound for healing.   6/19: Small skin tear on buttock- continue to monitor.  7. Fluids/Electrolytes/Nutrition: Monitor I/O.  8. HTN: Monitor BP tid- continue Norvasc.             Monitor with increased mobility.   6/22- BP 110/50- con't regimen  6/25- well controlled- con't meds 9. T2DM with hyperglycemia: Monitor BS ac/hs.  Lantus increased to 15 units bid today. Now on Lantus bid with SSI for elevated BS.   6/18: CBG has been as  low as 47. Decrease Lantus to 14U BID  6/19: CBG down to 69 this morning. Decrease Lantus to 13U BID  6/27- BGs 63-132 in last 24 hours- will decrease to 12 units, since sometimes sig. Elevated as well- due to renal fxn, don't want to add oral agents  6/30- BGs well controlled- con't meds   CBG (last 3)  Recent Labs    05/09/20 2012 05/10/20 0636 05/10/20 1141  GLUCAP 185* 79 103*   6/22- when on higher dose of Lantus, pt's BGs dropped.              Monitor with increased mobility. 6/28: well controlled.  10. COPD: Wean oxygen as tolerated. Continue Breo/Incruse.  6/26- used O2 intermittently at home prior- cannot seem to wean off O2- will d/w nursing.  6/29- d/w nursing to wean O2 - if possible- per PT dropped to 85% off O2 in gym- might need to go home on O2-  6/30- will likely need O2 at d/c- please get PT to address 11. Acute on chronic renal failure: Trending to baseline line? Will order PVRs to monitor voiding.             Creatinine trending upward on 6/17: Bolus 250cc  6/18: Down to 2.3  6/21- Cr 2.19- appears to be baseline around 2.19 and BUN 55  6/22- Cr 2.29- today- not sure if due to keflex- but is still on lower side  6/24- Cr down to 2.01- much improved- con't regimen  6/28: Cr 1.94.   12. ABLA/Hematuria: Continues--will continue to monitor H/H. Off Plavix and Lovenox at this time.             Hgb trending downward on 6/17. Up to 7.6 on 6/18.   6/28: Hgb stable.  13. Constipation: Had BM 6/19 with enema. Increased laxatives  to colace TID and Senna HS  6/21- cleaned out yesterday 14. UTI -Klebiella Oxytoca   6/29- off Rocephin now 15. Urinary retention- needing in/out caths  6/24- on max dose of Flomax- no rash since allergic to Sulfa-  6/25 -still requiring icaths  6/26- will have wife taught how to in/out cath since no voiding at this time- no Sx's of reaction.  6/27- no voiding still- will need f/u with Urology since not sure why not voiding- wife was clear was "slow voider" prior to admission   6/29- still not voiding- on max dose Flomax  6/30- pt having urinary retention- no voiding even with in/out caths and max dose of Flomax- need caths 4-5x/day. SW to get pt caths at home.      LOS: 14 days A FACE TO FACE EVALUATION WAS PERFORMED  Denicia Pagliarulo 05/10/2020, 1:33 PM

## 2020-05-10 NOTE — Progress Notes (Signed)
Patient ID: [name] Small, male   DOB: 03/21/1928, 86 y.o.   MRN: [id number]   HHPT/OT/Aide referral accepted by [facility].   SW updated pt wife on above. Pt wife reports she received a call from [place] and was informed that pt would not receive bariatric DABSC as pt does not meet wt limit. States pt will received regular DABSC. SW informed on HHA. Preferred catheters are 39fr Coloplast soft. SW to send orders to [place] and follow-up with [employer] vendor about catheters.  Loralee Pacas, MSW, [place] Office: [phone] Cell: [phone] Fax: [phone]

## 2020-05-10 NOTE — Progress Notes (Signed)
Occupational Therapy Session Note  Patient Details  Name: [name] Small MRN: [id number] Date of Birth: Aug 13, 1928  Today's Date: 05/10/2020 OT Individual Time: 7124-5809 OT Individual Time Calculation (min): 71 min    Short Term Goals: Week 2:  OT Short Term Goal 1 (Week 2): Pt will perform lateral transfers to/from [name] with CGA while maintaining TDWB LLE OT Short Term Goal 2 (Week 2): Pt will perform clothing management via lateral leans on BSC with Min A OT Short Term Goal 3 (Week 2): Pt will perform sit to stand while maintaining TDWB on LLE with Min A OT Short Term Goal 4 (Week 2): Pt will perform LB dressing with Mod A using AE as needed  Skilled Therapeutic Interventions/Progress Updates:    Pt greeted at time of session asleep but easily aroused with wife present, agreeable to OT session. Trialed standard drop arm BSC from bed level and pt transferred to drop arm BSC with CGA, max assist to doff clothing for BM, which took extended time d/t loose stool and hygiene. Pt performed multiple partial stands bu bearing weight through BUEs to lift buttocks for wife to perform hygiene and don brief/pants over hips. LB dressing Max/total assist this date with assist to thread pants over feet and pt able to partially assist with pulling up to knee level and wife assist to don over hips in partial stand. Education provided on again having the [name] by a stable surface to prevent any movement for safety during transfers. When attempting to transfer back to bed (at height of the patient's bed at home), pt required significantly more assistance going from a standard drop arm BSC back to bed, difficulty lifting buttocks and bearing weight through BUEs d/t lack of surface that is provided from a bariatric drop arm BSC that has been trialed in previous sessions. Pt is unsteady when transferring from standard drop arm BSC to other surface d/t lack of availability for space for hand placement to bear weight and  lift buttocks for lateral transfer. Wife feels most comfortable with bariatric drop arm BSC to decrease risk of falls at home. Bed mobility performed with supervision with use of leg lifter. Once back in supine pt left with call bell within in reach, alarm on, all needs met.   Therapy Documentation Precautions:  Precautions Precautions: Fall Precaution Comments: TDWB LLE Restrictions Weight Bearing Restrictions: Yes LLE Weight Bearing: Non weight bearing Other Position/Activity Restrictions: Pt has difficulty maintaining weight bearing.    Therapy/Group: Individual Therapy  [name] 05/10/2020, 3:55 PM

## 2020-05-10 NOTE — Progress Notes (Signed)
Patient will need support catheter as needed every 6 hours for urinary retention 16 [place]

## 2020-05-10 NOTE — Plan of Care (Signed)
  Problem: RH BOWEL ELIMINATION Goal: RH STG MANAGE BOWEL WITH ASSISTANCE Description: STG Manage Bowel with Min Assistance. Outcome: Progressing   Problem: RH BLADDER ELIMINATION Goal: RH STG MANAGE BLADDER WITH ASSISTANCE Description: STG Manage Bladder With Min Assistance Outcome: Progressing   Problem: RH SKIN INTEGRITY Goal: RH STG SKIN FREE OF INFECTION/BREAKDOWN Description: No new breakdown with min assist  Outcome: Progressing   Problem: RH SAFETY Goal: RH STG ADHERE TO SAFETY PRECAUTIONS W/ASSISTANCE/DEVICE Description: STG Adhere to Safety Precautions With Min Assistance/Device. Outcome: Progressing   Problem: RH PAIN MANAGEMENT Goal: RH STG PAIN MANAGED AT OR BELOW PT'S PAIN GOAL Description: < 3 out of 10.  Outcome: Progressing

## 2020-05-10 NOTE — Progress Notes (Signed)
Physical Therapy Session Note  Patient Details  Name: EDRICK WHITEHORN MRN: [id number] Date of Birth: [birth year]  Today's Date: 05/10/2020 PT Individual Time: 0900-0945 PT Individual Time Calculation (min): 45 min  PT Missed Time: 15 min Missed Time Reason: patient refusal  Short Term Goals: Week 2:  PT Short Term Goal 1 (Week 2): STGs=LTGs due to ELOS  Skilled Therapeutic Interventions/Progress Updates:    Pt received seated in bed, agreeable with encouragement to participate in therapy session. Per pt's wife report pt is "having a bad day". Pt requesting to perform bed level therex this AM but agreeable to participate in more functional OOB mobility with encouragement. Pt is min A for semi-reclined to sitting EOB with use of leg lifter for LLE management and some assist needed for trunk elevation. Assisted pt with donning R tennis shoe while seated EOB. Squat pivot transfer bed to w/c with CGA. Dependent transport via w/c to/from therapy gym for time conservation. Squat pivot transfer w/c to/from mat table with CGA. Session focus on sit to stand to RW. Pt is initially min A for sit to stand to RW x 5 reps with poor adherence to [facility] precautions. With max cueing for TDWBing on LLE pt becomes mod A for sit to stand to RW but does continue to exhibit some WBing through this limb. Stand pivot transfer mat table to w/c with min A to the L with RW. Pt exhibits poor adherence to [facility] precautions with transfer. Stand pivot transfer back to mat table with RW and max A for safety and max cueing for WBing precautions. Pt then reports urgent need to use the bathroom. Once pt returned to his room he requests to return to bed. Encouraged pt to perform toilet transfer as he had reported urge to use the bathroom. Pt declines to perform toilet transfer and yells out that he wants to return to bed. Squat pivot transfer back to bed with CGA. Sit to supine CGA with use of leg lifter for LLE management. Pt left  semi-reclined in bed with needs in reach, bed alarm in place, wife present. Pt missed 15 min of therapy session due to refusal of further participation.  Therapy Documentation Precautions:  Precautions Precautions: Fall Precaution Comments: TDWB LLE Restrictions Weight Bearing Restrictions: Yes LLE Weight Bearing: Non weight bearing Other Position/Activity Restrictions: Pt has difficulty maintaining weight bearing.   Therapy/Group: Individual Therapy   Excell Seltzer, PT, DPT  05/10/2020, 4:23 PM

## 2020-05-10 NOTE — Progress Notes (Addendum)
Patient ID: HJALMER IOVINO, male   DOB: 1928/08/13, 86 y.o.   MRN: [id number]  SW faxed catheter orders to Dr. Domenica Fail [phone] and [place] pharmacy [phone].  SW updated [facility] ([phone]/L:[phone]) to update on order. SW faxed over orders.   Loralee Pacas, MSW, [place] Office: [phone] Cell: [phone] Fax: [phone]

## 2020-05-10 NOTE — Progress Notes (Signed)
Physical Therapy Session Note  Patient Details  Name: [name] Small MRN: [id number] Date of Birth: 06/12/1928  Today's Date: 05/10/2020 PT Individual Time: 1102-1142 PT Individual Time Calculation (min): 40 min  Missed Time: 20 min (fatigue)  Short Term Goals: Week 2:  PT Short Term Goal 1 (Week 2): STGs=LTGs due to ELOS  Skilled Therapeutic Interventions/Progress Updates:     Pt received supine in bed and agreeable to therapy. Reports pain in LLE. Number not provided. PT provides rest breaks and repositioning as needed to manage pain symptoms. Supine to sit with leg lifter and supervision for sequencing and positioning for safety. Lateral transfer from bed to [facility] with supervision for leg placement. WC transport to therapy gym and additional lateral transfer to mat table. Pt attempt to stand from mat table at lowest setting and is unable. From slightly elevated mat pt stands with min/modA and participate in bag throwing activity to practice efficiency of sit to stand transfers and standing balance training with LUE support on RW and RUE used to throw bean bags. Pt able to remain standing ~1 minute at a time for activity and requires extended seated rest breaks. Pt performs activity x6 bouts with extended seated rest breaks. Pt requesting to return to supine due to fatigue and lethargy today. WC transport back to room and lateral transfer back to bed with supervision. Sit to supine with verbal cues on positioning and sequencing. Pt left supine with alarm intact and all needs within reach.  Therapy Documentation Precautions:  Precautions Precautions: Fall Precaution Comments: TDWB LLE Restrictions Weight Bearing Restrictions: Yes LLE Weight Bearing: Touchdown weight bearing Other Position/Activity Restrictions: Pt has difficulty maintaining weight bearing.    Therapy/Group: Individual Therapy  Breck Coons, PT, DPT 05/10/2020, 3:45 PM

## 2020-05-11 ENCOUNTER — Inpatient Hospital Stay (HOSPITAL_COMMUNITY): Payer: Medicare Other | Admitting: Occupational Therapy

## 2020-05-11 ENCOUNTER — Inpatient Hospital Stay (HOSPITAL_COMMUNITY): Payer: Medicare Other

## 2020-05-11 LAB — GLUCOSE, CAPILLARY
Glucose-Capillary: 91 mg/dL (ref 70–99)
Glucose-Capillary: 82 mg/dL (ref 70–99)
Glucose-Capillary: 124 mg/dL — ABNORMAL HIGH (ref 70–99)
Glucose-Capillary: 124 mg/dL — ABNORMAL HIGH (ref 70–99)

## 2020-05-11 MED ORDER — SENNA 8.6 MG PO TABS: 2.0000 | ORAL_TABLET | Freq: Every day | ORAL | 0 refills | Status: DC

## 2020-05-11 MED ORDER — POLYETHYLENE GLYCOL 3350 17 G PO PACK: 17.0000 g | PACK | Freq: Every day | ORAL | 0 refills | Status: DC

## 2020-05-11 MED ORDER — BUDESONIDE-FORMOTEROL FUMARATE 160-4.5 MCG/ACT IN AERO: 2.0000 | INHALATION_SPRAY | Freq: Two times a day (BID) | RESPIRATORY_TRACT | 12 refills | Status: DC | PRN

## 2020-05-11 MED ORDER — ESCITALOPRAM OXALATE 5 MG PO TABS
5.0000 mg | ORAL_TABLET | Freq: Every day | ORAL | 1 refills | Status: DC
Start: 2020-05-11 — End: 2021-06-11

## 2020-05-11 MED ORDER — DOCUSATE SODIUM 100 MG PO CAPS: 100.0000 mg | ORAL_CAPSULE | Freq: Three times a day (TID) | ORAL | 0 refills | Status: DC

## 2020-05-11 MED ORDER — ADULT MULTIVITAMIN W/MINERALS CH: 1.0000 | ORAL_TABLET | Freq: Every day | ORAL | Status: DC

## 2020-05-11 MED ORDER — TAMSULOSIN HCL 0.4 MG PO CAPS: 0.8000 mg | ORAL_CAPSULE | Freq: Every day | ORAL | 0 refills | Status: DC

## 2020-05-11 MED ORDER — UMECLIDINIUM BROMIDE 62.5 MCG/INH IN AEPB: 1.0000 | INHALATION_SPRAY | Freq: Every day | RESPIRATORY_TRACT | 0 refills | Status: DC

## 2020-05-11 MED ORDER — ATORVASTATIN CALCIUM 10 MG PO TABS: 10.0000 mg | ORAL_TABLET | Freq: Every day | ORAL | 3 refills | Status: DC

## 2020-05-11 MED ORDER — TIOTROPIUM BROMIDE MONOHYDRATE 18 MCG IN CAPS: 18.0000 ug | ORAL_CAPSULE | Freq: Every day | RESPIRATORY_TRACT | 12 refills | Status: DC

## 2020-05-11 MED ORDER — TRAMADOL HCL 50 MG PO TABS: 50.0000 mg | ORAL_TABLET | Freq: Four times a day (QID) | ORAL | 0 refills | Status: DC

## 2020-05-11 MED ORDER — CALCIUM CARBONATE 1250 (500 CA) MG PO TABS: 1.0000 | ORAL_TABLET | Freq: Every day | ORAL | 0 refills | Status: DC

## 2020-05-11 MED ORDER — HYDROCODONE-ACETAMINOPHEN 5-325 MG PO TABS: 1.0000 | ORAL_TABLET | Freq: Four times a day (QID) | ORAL | 0 refills | Status: DC | PRN

## 2020-05-11 MED ORDER — ACETAMINOPHEN 325 MG PO TABS: 325.0000 mg | ORAL_TABLET | ORAL | Status: AC | PRN

## 2020-05-11 MED ORDER — POLYSACCHARIDE IRON COMPLEX 150 MG PO CAPS: 150.0000 mg | ORAL_CAPSULE | Freq: Every day | ORAL | 0 refills | Status: DC

## 2020-05-11 MED ORDER — METHOCARBAMOL 500 MG PO TABS: 500.0000 mg | ORAL_TABLET | Freq: Four times a day (QID) | ORAL | 0 refills | Status: DC | PRN

## 2020-05-11 MED ORDER — MELATONIN 5 MG PO TABS: 5.0000 mg | ORAL_TABLET | Freq: Every evening | ORAL | 0 refills | Status: DC | PRN

## 2020-05-11 NOTE — Progress Notes (Signed)
Physical Therapy Discharge Summary  Patient Details  Name: [name] Small MRN: [id number] Date of Birth: [birth year]  Today's Date: 05/11/2020 PT Individual Time: 5397-6734 PT Individual Time Calculation (min): 69 min    Patient has met 5 of 9 long term goals due to improved activity tolerance, improved balance, increased strength and decreased pain.  Patient to discharge at a wheelchair level [place].   Patient's care partner is independent to provide the necessary physical assistance at discharge.  Reasons goals not met: Pt with ongoing weakness in RLE and unable to ambulate. Also requiring minA for car transfer and modA for sit to stand. Pt will not have to perform sit to stand transfer as he will have WC and is supervision for lateral transfers and wife can assist with other forms of mobility.  Recommendation:  Patient will benefit from ongoing skilled PT services in home health setting to continue to advance safe functional mobility, address ongoing impairments in strength, transfers, balance, and minimize fall risk.  Equipment: 18" x 18" manual WC  Reasons for discharge: treatment goals met and discharge from hospital  Patient/family agrees with progress made and goals achieved: Yes   Skilled Therapeutic Interventions: Pt received supine in bed and agreeable to therapy. Reports pain in LLE. Number not provided. PT provided repositioning and rest breaks as needed to manage pain symptoms. Pt perform bed mobility at mod(I) with use of leg lifter. Sit to stand with RW to pull up shorts with modA from PT. Pt performs lateral transfer to [facility] with supervision and cues for positioning. Pt propels WC 200' with BUEs with PT providing oxygen tank management. Pt's wife present for family ed. Pt performs car transfer with minA and PT assisting at hips. Pt performs x3 additional reps with wife providing assistance and PT cuing on guarding and hand placement. PT educates on safety with lifting WC up  stairs as this will likely be how pt will enter home following discharge. Pt and wife then perform multiple reps of lateral transfer from [facility] to hard backed chair to mimic transfer to pt's lift chair at home. PT provides cues for positioning and safety and wife provides manual assist and setup. Pt performs lateral transfer back to bed with wife assisting and sit to supine at mod(I) with leg lifter. PT educates on HEP and wife provided with handout. Pt left supine in be with alarm intact and all needs within reach.  PT Discharge Precautions/Restrictions Precautions Precautions: Fall Restrictions Weight Bearing Restrictions: Yes LLE Weight Bearing: Touchdown weight bearing Vision/Perception  Perception Perception: Within Functional Limits Praxis Praxis: Intact  Cognition Overall Cognitive Status: Within Functional Limits for tasks assessed Arousal/Alertness: Awake/alert Orientation Level: Oriented X4 Safety/Judgment: Appears intact Sensation Sensation Light Touch: Impaired by gross assessment Additional Comments: Peripheral neuropathy. Light touch diminished in B feet. Coordination Gross Motor Movements are Fluid and Coordinated: Yes Fine Motor Movements are Fluid and Coordinated: Yes Motor  Motor Motor: Within Functional Limits  Mobility Bed Mobility Bed Mobility: Sit to Supine;Supine to Sit;Sitting - Scoot to Edge of Bed Supine to Sit: Independent with assistive device Sitting - Scoot to Edge of Bed: Independent Sit to Supine: Independent with assistive device Transfers Transfers: Sit to Stand;Stand to Sit;Lateral/Scoot Transfers Sit to Stand: Moderate Assistance - Patient 50-74% Stand to Sit: Moderate Assistance - Patient 50-74% Lateral/Scoot Transfers: Supervision/Verbal cueing Transfer (Assistive device): None Locomotion  Gait Ambulation: No Gait Gait: No Stairs / Additional Locomotion Stairs: No Wheelchair Mobility Wheelchair Mobility: Yes Wheelchair Assistance:  Set up assist Wheelchair Propulsion: Both upper extremities Wheelchair Parts Management: Supervision/cueing Distance: 200'  Trunk/Postural Assessment  Cervical Assessment Cervical Assessment:  (forward head) Thoracic Assessment Thoracic Assessment:  (rounded shoulders) Lumbar Assessment Lumbar Assessment:  (posterior pelvic tilt in sitting) Postural Control Postural Control: Within Functional Limits  Balance Balance Balance Assessed: Yes Static Sitting Balance Static Sitting - Level of Assistance: 7: Independent Dynamic Sitting Balance Dynamic Sitting - Level of Assistance: 5: Stand by assistance Static Standing Balance Static Standing - Level of Assistance: 4: Min assist Dynamic Standing Balance Dynamic Standing - Level of Assistance: 3: Mod assist Extremity Assessment  RLE Assessment RLE Assessment: Exceptions to [facility] Strength Comments: Grossly 4+/5 LLE Assessment LLE Assessment: Exceptions to [facility] Strength Comments: Limited by pain. Grossly 3/5    Breck Coons, PT, DPT 05/11/2020, 4:16 PM

## 2020-05-11 NOTE — Plan of Care (Signed)
  Problem: Consults Goal: RH GENERAL PATIENT EDUCATION Description: See Patient Education module for education specifics. Outcome: Progressing   Problem: RH BOWEL ELIMINATION Goal: RH STG MANAGE BOWEL WITH ASSISTANCE Description: STG Manage Bowel with Min Assistance. Outcome: Progressing   Problem: RH BLADDER ELIMINATION Goal: RH STG MANAGE BLADDER WITH ASSISTANCE Description: STG Manage Bladder With Min Assistance Outcome: Progressing   Problem: RH SKIN INTEGRITY Goal: RH STG SKIN FREE OF INFECTION/BREAKDOWN Description: No new breakdown with min assist  Outcome: Progressing Goal: RH STG ABLE TO PERFORM INCISION/WOUND CARE W/ASSISTANCE Description: STG Able To Perform Incision/Wound Care With [employer]. Outcome: Progressing   Problem: RH SAFETY Goal: RH STG ADHERE TO SAFETY PRECAUTIONS W/ASSISTANCE/DEVICE Description: STG Adhere to Safety Precautions With Min Assistance/Device. Outcome: Progressing   Problem: RH PAIN MANAGEMENT Goal: RH STG PAIN MANAGED AT OR BELOW PT'S PAIN GOAL Description: < 3 out of 10.  Outcome: Progressing

## 2020-05-11 NOTE — Progress Notes (Signed)
Occupational Therapy Discharge Summary  Patient Details  Name: [name] Small MRN: [id number] Date of Birth: 11/16/1927  Today's Date: 05/11/2020 OT Individual Time: 1415-1530 OT Individual Time Calculation (min): 75 min    Patient has met 3 of 8 long term goals due to improved activity tolerance, improved balance, postural control and improved awareness.  Patient to discharge at overall Mod- Max Assist level. Pt is Supervision-CGA for lateral transfers and requires Max A for LB ADLs but is able to perform at [facility] level where he will perform partial stands with weight bearing through [place] for wife to complete clothing management, hygiene, and washing buttocks/periarea for bathing. Patient's care partner is independent to provide the necessary physical assistance at discharge.    Reasons goals not met: Pt unable to meet goals for LB ADLs including bathing and dressing d/t inconsistent pain in LLE (improved since eval) and TDWB precautions for LLE. Unable to forward weight shift or use compensatory techniques to reach BLEs past knee height. Assistance needed for threading pants/brief and wife able to don over hips while pt performs partial stand bearing weight through [place]. Goals not met d/t lack of progress but pt is still able to go home at a functional level with assist from wife.   Recommendation:  Patient will benefit from ongoing skilled OT services in home health setting to continue to advance functional skills in the area of BADL and Reduce care partner burden.  Equipment: drop arm BSC. Pt would benefit from bariatric drop arm BSC for more surface area, wife is aware of where to purchase if needed.   Reasons for discharge: discharge from hospital and progress toward OT goals to DC home at a functional level.   Patient/family agrees with progress made and goals achieved: Yes  OT Discharge Precautions/Restrictions  Restrictions Weight Bearing Restrictions: No Pain Pain  Assessment Pain Scale: 0-10 Pain Score: Asleep ADL ADL Where Assessed-Upper Body Dressing: Chair Lower Body Dressing: Dependent Toileting: Dependent Toilet Transfer: Maximal assistance Toilet Transfer Method: Other (comment) (slide board with other rehab staff, [place] transfer) Vision Baseline Vision/History: No visual deficits Patient Visual Report: No change from baseline Perception  Perception: Within Functional Limits Praxis Praxis: Intact Cognition Overall Cognitive Status: Within Functional Limits for tasks assessed Arousal/Alertness: Awake/alert Orientation Level: Oriented X4 Memory: Appears intact Safety/Judgment: Appears intact Sensation Sensation Light Touch: Impaired by gross assessment Additional Comments: Peripheral neuropathy. Light touch diminished in B feet. Coordination Gross Motor Movements are Fluid and Coordinated: Yes Fine Motor Movements are Fluid and Coordinated: Yes Motor  Motor Motor: Within Functional Limits Mobility  Bed Mobility Bed Mobility: Sit to Supine;Supine to Sit;Sitting - Scoot to Edge of Bed Supine to Sit: Independent with assistive device Sitting - Scoot to Edge of Bed: Independent Sit to Supine: Independent with assistive device Transfers Sit to Stand: Moderate Assistance - Patient 50-74% Stand to Sit: Moderate Assistance - Patient 50-74%  Trunk/Postural Assessment  Cervical Assessment Cervical Assessment:  (forward head) Thoracic Assessment Thoracic Assessment:  (rounded shoulders) Lumbar Assessment Lumbar Assessment:  (posterior pelvic tilt in sitting) Postural Control Postural Control: Within Functional Limits  Balance Balance Balance Assessed: Yes Static Sitting Balance Static Sitting - Level of Assistance: 7: Independent Dynamic Sitting Balance Dynamic Sitting - Level of Assistance: 5: Stand by assistance Static Standing Balance Static Standing - Level of Assistance: 4: Min assist Dynamic Standing Balance Dynamic  Standing - Level of Assistance: 3: Mod assist Extremity/Trunk Assessment RUE Assessment RUE Assessment: Within Functional Limits     Jarrett Soho  Rosey Bath 05/11/2020, 11:51 AM

## 2020-05-11 NOTE — Progress Notes (Addendum)
Occupational Therapy Session Note  Patient Details  Name: [name] Small MRN: [id number] Date of Birth: [birth year]  Today's Date: 05/11/2020 OT Individual Time: 2951-8841 and 1415-1530 OT Individual Time Calculation (min): 40 min and 75 min   Short Term Goals: Week 2:  OT Short Term Goal 1 (Week 2): Pt will perform lateral transfers to/from [facility] with CGA while maintaining TDWB LLE OT Short Term Goal 2 (Week 2): Pt will perform clothing management via lateral leans on BSC with Min A OT Short Term Goal 3 (Week 2): Pt will perform sit to stand while maintaining TDWB on LLE with Min A OT Short Term Goal 4 (Week 2): Pt will perform LB dressing with Mod A using AE as needed  Skilled Therapeutic Interventions/Progress Updates:    Pt was gone for X-ray at beginning of OT session but began education with wife on DC home, DC planning, equipment needs, etc. Pt did return back to room with tech, sleepy but easily aroused. Discussion and collaboration with patient and wife regarding getting ramp set up with [facility], drop arm BSC and realistic coverage for [facility] and preparation for toileting at home, and potential wheelchair needs for ADLs at home. Discussion with social work as well regarding [place] communication directly with the patient/wife and relayed to the wife so she could reach out to [place] for updates, which was done. Therapist also provided level 2 and 3 therabands for home use exercise program, briefly reviewed and will continue to reinforce today for home use. Pt left in bed supine with HOB elevated, call bell in reach, alarm on, all needs met.   Session 2: Pt greeted at time of session supine in bed with wife present in room for further training on standard drop arm BSC transfers. Transitioned supine to sitting up at EOB with leg lifter with Mod I-Supervision and once sitting up at EOB transferred to regular drop arm BSC with CGA with wife present in front of pt to hold LLE out to prevent weight bearing and to  increase her comfort in assisting with transfers. Pt and wife ed on keeping BSC steady by placing against a surface like a wall at home and demonstration provided for visual aide. Once on [facility], pt needed to have BM and was total assist to doff clothing when bearing weight through [place] and allowing wife to perform clothing management and hygiene. Multiple trials required for lifting buttocks for wife to perform these tasks. Wife ed on how to don new brief for straps/problem solving for home use and total assist to don pants back over hips. Wife/pt plan to perform sink baths while on [facility] for energy conservation. Lateral transfer back to bed with CGA with wife assisting, and wife ed on how to use gait belt for comfort and safety. Leg lifter for LLE with pt performing Mod I-Supervision and scoot up towards [place] with Mod I. Reviewed UB strengthening HEP with hand out with therabands provided, good carry over. Pt left in bed supine with alarm on, call bell in reach, all needs met. Ready for DC home tomorrow and wife reported ramp going up next week.   Therapy Documentation Precautions:  Precautions Precautions: Fall Precaution Comments: TDWB LLE Restrictions Weight Bearing Restrictions: No LLE Weight Bearing: Non weight bearing Other Position/Activity Restrictions: Pt has difficulty maintaining weight bearing.   Therapy/Group: Individual Therapy  [name] 05/11/2020, 11:44 AM

## 2020-05-11 NOTE — Progress Notes (Signed)
Patient ID: [name] Small, male   DOB: [birth year], 86 y.o.   MRN: [id number]  SW faxed urgent items request form for DME items (fax [phone]) to support bariatric DABSC and to Dr. Domenica Fail  (fax [phone]). SW left message for receptionist to inform pt nursing staff on these items being faxed and requested return phone call.   SW left message for [place] (p:[phone] ext. [phone] covering for primary SW Lanier Prude on 7/1 & 7/2 and Horald Chestnut, LCSW ext. [phone] on 7/6)to inform on information faxed to Dr. Domenica Fail for catheters, and documentation supporting the need for bariatric drop arm bedside commode. SW waiting on follow-up to confirm.   *SW returned phone call to [facility] to discuss reasons for bariatric DABSC. States she will follow-up if any changes. SW received msg from [place] stating that [place] OT spoke with pt about bariatric DABSC and they both agree it is too big so he has agreed to have a standard DABSC.   SW ordered w/c with [employer] via  [place].   Loralee Pacas, MSW, [place] Office: [phone] Cell: [phone] Fax: [phone]

## 2020-05-11 NOTE — Progress Notes (Signed)
Patient ID: [name] Small, male   DOB: 05/02/1928, 86 y.o.   MRN: [id number]   Met with patient and spouse during OT family education session. Today was patient "Grad" day. Observed patient doing a lateral transfer to [facility] while managing weight bearing precautions. [employer] equipment delivered wheelchair to room during session. Spouse had no questions. This RN Care Coordinator explained how the discharge process works and what to expect on 05/13/20. She stated she understood and thanked me for my time.  Dorthula Nettles, RN, BSN, [place] Office [phone] Cell [phone]

## 2020-05-11 NOTE — Progress Notes (Signed)
Patient ID: [name] Small, male   DOB: 04/09/1928, 86 y.o.   MRN: [id number]  Therapy recommends pt have a w/c at d/c. SW met with pt and pt husband to inform on item will be ordered through [place] as short notice to order through [place]. Wife is fine with using [employer] Medicare to purchase.   SW sent order to [facility] via [facility].   Loralee Pacas, MSW, [place] Office: [phone] Cell: [phone] Fax: [phone]

## 2020-05-11 NOTE — Progress Notes (Signed)
[facility] PHYSICAL MEDICINE & REHABILITATION PROGRESS NOTE   Subjective/Complaints: Went for XR this morning of hip. Results pending. Glucose 91 this morning. 246 last night. Will maintain current regimen and continue CBG checks.  Vitals otherwise stable this morning.   ROS:  Pt denies SOB, abd pain, CP, N/V/C/D, and vision changes  Objective:   No results found. No results for input(s): WBC, HGB, HCT, PLT in the last 72 hours. No results for input(s): NA, K, CL, CO2, GLUCOSE, BUN, CREATININE, CALCIUM in the last 72 hours.  Intake/Output Summary (Last 24 hours) at 05/11/2020 1040 Last data filed at 05/11/2020 0809 Gross per 24 hour  Intake 660 ml  Output 950 ml  Net -290 ml     Physical Exam: Vital Signs Blood pressure 129/62, pulse 61, temperature 97.7 F (36.5 C), resp. rate 18, height 6\' 2"  (1.88 m), weight 98.4 kg, SpO2 98 %. General: Alert and oriented x 3, No apparent distress HEENT: Head is normocephalic, atraumatic, PERRLA, EOMI, sclera anicteric, oral mucosa pink and moist, dentition intact, ext ear canals clear,  Neck: Supple without JVD or lymphadenopathy Heart: Reg rate and rhythm. No murmurs rubs or gallops Chest: CTA bilaterally without wheezes, rales, or rhonchi; no distress Abdomen: Soft, non-tender, non-distended, bowel sounds positive. Musculoskeletal:        General: Swelling present.     Cervical back: Normal range of motion.     Comments: Moderate edema left hip to knee. Mild edema L hip- incision looks great. No drainage or erythema. Sutures removed.  Left transmet amputation site well healed. Looks great/scarred  Neurological: He is alert.  Motor: B/l UE: 5/5 proximal to distal RLE: HF: 2+/5, KE 3/5, ADF 4+/5 LLE: HF 2-/5, KE 2+/5, ADF 3/5 HOH- worse than normal since only wearing 1 hearing aid - a little hoarse stil Skin: Skin is warm and dry. Small tear on buttock- not open.  Psychiatric: more awake during exam  Assessment/Plan: 1. Functional  deficits secondary to deficits with mobility, endurance, self-care secondary to left periprosthetic femur fracture, which require 3+ hours per day of interdisciplinary therapy in a comprehensive inpatient rehab setting.  Physiatrist is providing close team supervision and 24 hour management of active medical problems listed below.  Physiatrist and rehab team continue to assess barriers to discharge/monitor patient progress toward functional and medical goals  Care Tool:  Bathing  Bathing activity did not occur: Refused Body parts bathed by patient: Right arm, Left arm, Chest, Abdomen   Body parts bathed by helper: Right lower leg, Left lower leg     Bathing assist Assist Level: Maximal Assistance - Patient 24 - 49%     Upper Body Dressing/Undressing Upper body dressing   What is the patient wearing?: Pull over shirt    Upper body assist Assist Level: Set up assist    Lower Body Dressing/Undressing Lower body dressing    Lower body dressing activity did not occur: Refused What is the patient wearing?: Pants, Underwear/pull up     Lower body assist Assist for lower body dressing: Maximal Assistance - Patient 25 - 49%     Toileting Toileting    Toileting assist Assist for toileting: Total Assistance - Patient < 25% Assistive Device Comment: Urinal   Transfers Chair/bed transfer  Transfers assist     Chair/bed transfer assist level: Contact Guard/Touching assist     Locomotion Ambulation   Ambulation assist   Ambulation activity did not occur: Safety/medical concerns  Walk 10 feet activity   Assist  Walk 10 feet activity did not occur: Safety/medical concerns        Walk 50 feet activity   Assist Walk 50 feet with 2 turns activity did not occur: Safety/medical concerns         Walk 150 feet activity   Assist Walk 150 feet activity did not occur: Safety/medical concerns         Walk 10 feet on uneven surface   activity   Assist Walk 10 feet on uneven surfaces activity did not occur: Safety/medical concerns         Wheelchair     Assist Will patient use wheelchair at discharge?: Yes Type of Wheelchair: Manual Wheelchair activity did not occur: Refused  Wheelchair assist level: Supervision/Verbal cueing Max wheelchair distance: 170'    Wheelchair 50 feet with 2 turns activity    Assist    Wheelchair 50 feet with 2 turns activity did not occur: Refused   Assist Level: Supervision/Verbal cueing   Wheelchair 150 feet activity     Assist  Wheelchair 150 feet activity did not occur: Safety/medical concerns   Assist Level: Supervision/Verbal cueing   Blood pressure 129/62, pulse 61, temperature 97.7 F (36.5 C), resp. rate 18, height 6\' 2"  (1.88 m), weight 98.4 kg, SpO2 98 %.    Medical Problem List and Plan: 1.  Deficits with mobility, endurance, self-care secondary to left periprosthetic femur fracture.              -patient may not  shower             -ELOS/Goals: 10-14 days/Min A            - Continue CIR  -Needs 2 person assist for lower body dressing.  2.  Antithrombotics: -DVT/anticoagulation:  Mechanical: Sequential compression devices, below knee Bilateral lower extremities Will check dopplers.             -antiplatelet therapy: N/A 3. Pain Management: Has been on hydrocodone 5 mg every 8 hours. Changed to PRN as sedating patient and it makes more sense for him to take before therapy sessions.   6/21- pain for the first time controlled per PT this AM- con't prn Norco  6/24- pain in general worse- wants something in between pain meds prn- will add tramadol 50 mg q6 hours prn in between Norco dosing  6/25- pain MUCH improved with alternating tramadol and oxy  7/1: well controlled. Hip XR results pending.              Monitor with increased exertion 4. Mood: LCSW to follow for evaluation and support.              -antipsychotic agents: N/a 5. Neuropsych:  This patient is not fully capable of making decisions on his own behalf. 6. Skin/Wound Care: Monitor wound for healing.   6/19: Small skin tear on buttock- continue to monitor.  7. Fluids/Electrolytes/Nutrition: Monitor I/O.  8. HTN: Monitor BP tid- continue Norvasc.             Monitor with increased mobility.   7/1- well controlled- continue current regimen 9. T2DM with hyperglycemia: Monitor BS ac/hs.  Lantus increased to 15 units bid today. Now on Lantus bid with SSI for elevated BS.   6/18: CBG has been as low as 47. Decrease Lantus to 14U BID  6/19: CBG down to 69 this morning. Decrease Lantus to 13U BID  6/27- BGs 63-132 in last 24 hours-  will decrease to 12 units, since sometimes sig. Elevated as well- due to renal fxn, don't want to add oral agents   CBG (last 3)  Recent Labs    05/10/20 2025 05/10/20 2224 05/11/20 0557  GLUCAP 211* 246* 91   7/1: labile: maintain current regimen 10. COPD: Wean oxygen as tolerated. Continue Breo/Incruse.  6/26- used O2 intermittently at home prior- cannot seem to wean off O2- will d/w nursing.  6/29- d/w nursing to wean O2 - if possible- per PT dropped to 85% off O2 in gym- might need to go home on O2-  6/30- will likely need O2 at d/c- please get PT to address 11. Acute on chronic renal failure: Trending to baseline line? Will order PVRs to monitor voiding.             Creatinine trending upward on 6/17: Bolus 250cc  6/18: Down to 2.3  6/21- Cr 2.19- appears to be baseline around 2.19 and BUN 55  6/22- Cr 2.29- today- not sure if due to keflex- but is still on lower side  6/24- Cr down to 2.01- much improved- con't regimen  6/28: Cr 1.94.   12. ABLA/Hematuria: Continues--will continue to monitor H/H. Off Plavix and Lovenox at this time.             Hgb trending downward on 6/17. Up to 7.6 on 6/18.   6/28: Hgb stable.  13. Constipation: Had BM 6/19 with enema. Increased laxatives to colace TID and Senna HS  6/21- cleaned out  yesterday 14. UTI -Klebiella Oxytoca   6/29- off Rocephin now 15. Urinary retention- needing in/out caths  6/24- on max dose of Flomax- no rash since allergic to Sulfa-  6/25 -still requiring icaths  6/26- will have wife taught how to in/out cath since no voiding at this time- no Sx's of reaction.  6/27- no voiding still- will need f/u with Urology since not sure why not voiding- wife was clear was "slow voider" prior to admission   6/29- still not voiding- on max dose Flomax  6/30- pt having urinary retention- no voiding even with in/out caths and max dose of Flomax- need caths 4-5x/day. SW to get pt caths at home.      LOS: 15 days A FACE TO FACE EVALUATION WAS PERFORMED  [name] Small P [name] Small 05/11/2020, 10:40 AM

## 2020-05-12 ENCOUNTER — Telehealth: Payer: Self-pay

## 2020-05-12 LAB — GLUCOSE, CAPILLARY
Glucose-Capillary: 52 mg/dL — ABNORMAL LOW (ref 70–99)
Glucose-Capillary: 75 mg/dL (ref 70–99)

## 2020-05-12 MED ORDER — INSULIN GLARGINE 100 UNIT/ML SOLOSTAR PEN: 12.0000 [IU] | PEN_INJECTOR | Freq: Two times a day (BID) | SUBCUTANEOUS | 11 refills | Status: DC

## 2020-05-12 MED ORDER — INSULIN GLARGINE 100 UNIT/ML ~~LOC~~ SOLN
10.0000 [IU] | Freq: Two times a day (BID) | SUBCUTANEOUS | Status: DC
  Filled 2020-05-12: qty 0.1

## 2020-05-12 MED ORDER — INSULIN GLARGINE 100 UNIT/ML SOLOSTAR PEN: 10.0000 [IU] | PEN_INJECTOR | Freq: Two times a day (BID) | SUBCUTANEOUS | 11 refills | Status: DC

## 2020-05-12 NOTE — Discharge Instructions (Addendum)
Inpatient Rehab Discharge Instructions  [name] Small Discharge date and time: No discharge date for patient encounter.   Activities/Precautions/ Functional Status: Activity: Touchdown weightbearing left lower extremity Diet: diabetic diet Wound Care: none needed Functional status:  ___ No restrictions     ___ Walk up steps independently ___ 24/7 supervision/assistance   ___ Walk up steps with assistance ___ Intermittent supervision/assistance  ___ Bathe/dress independently ___ Walk with walker     _x__ Bathe/dress with assistance ___ Walk Independently    ___ Shower independently ___ Walk with assistance    ___ Shower with assistance ___ No alcohol     ___ Return to work/school ________   COMMUNITY REFERRALS UPON DISCHARGE:    Home Health:   PT     OT                     Agency: [facility]: [phone] *Aide services will be arranged through the [place]. Please contact the [place] [phone]  at to discuss status if any questions/concerns about when services will begin.    Medical Equipment/Items Ordered: wheelchair                                                 Agency/Supplier: [facility] [phone]   Medical Equipment/Items Ordered: catheters 35fr                                                 Agency/Supplier: [facility] [phone] *referral for catheters to be ordered through the [facility] has been sent to the pharmacy. To get updates, please contact [facility] [phone] to get status on when catheters will be provided.*  Medical Equipment/Items Ordered: Catheters, drop arm bedside commode, and ramp                                                 Agency/Supplier: [facility] [phone] to get updates on status of items.    Special Instructions: Hold Plavix until further notice due to hematuria.  Continue in and out catheterizations as directed  Ambulatory referral obtained with urology  services for urinary retention   My questions have been answered and I understand these instructions. I will adhere to these goals and the provided educational materials after my discharge from the hospital.  Patient/Caregiver Signature _______________________________ Date __________  Clinician Signature _______________________________________ Date __________  Please bring this form and your medication list with you to all your follow-up doctor's appointments.      Orthopaedic Trauma Service Discharge Instructions   General Discharge Instructions  WEIGHT BEARING STATUS: Touchdown weightbearing left leg  RANGE OF MOTION/ACTIVITY: Okay for range of motion as tolerated  Wound Care: Incisions can be left open to air if there is no drainage. If incision continues to have drainage, follow wound care instructions below. Okay to shower if no drainage from incisions.  DVT/PE prophylaxis: No pharmacologic DVT prohylaxis due to hematuria  Diet: as you were eating previously.  Can use over the counter stool softeners and bowel preparations, such as Miralax, to  help with bowel movements.  Narcotics can be constipating.  Be sure to drink plenty of fluids  PAIN MEDICATION USE AND EXPECTATIONS  You have likely been given narcotic medications to help control your pain.  After a traumatic event that results in an fracture (broken bone) with or without surgery, it is ok to use narcotic pain medications to help control one's pain.  We understand that everyone responds to pain differently and each individual patient will be evaluated on a regular basis for the continued need for narcotic medications. Ideally, narcotic medication use should last no more than 6-8 weeks (coinciding with fracture healing).   As a patient it is your responsibility as well to monitor narcotic medication use and report the amount and frequency you use these medications when you come to your office visit.   We would also advise  that if you are using narcotic medications, you should take a dose prior to therapy to maximize you participation.  IF YOU ARE ON NARCOTIC MEDICATIONS IT IS NOT PERMISSIBLE TO OPERATE A MOTOR VEHICLE (MOTORCYCLE/CAR/TRUCK/MOPED) OR HEAVY MACHINERY DO NOT MIX NARCOTICS WITH OTHER CNS (CENTRAL NERVOUS SYSTEM) DEPRESSANTS SUCH AS ALCOHOL   STOP SMOKING OR USING NICOTINE PRODUCTS!!!!  As discussed nicotine severely impairs your body's ability to heal surgical and traumatic wounds but also impairs bone healing.  Wounds and bone heal by forming microscopic blood vessels (angiogenesis) and nicotine is a vasoconstrictor (essentially, shrinks blood vessels).  Therefore, if vasoconstriction occurs to these microscopic blood vessels they essentially disappear and are unable to deliver necessary nutrients to the healing tissue.  This is one modifiable factor that you can do to dramatically increase your chances of healing your injury.    (This means no smoking, no nicotine gum, patches, etc)  DO NOT USE NONSTEROIDAL ANTI-INFLAMMATORY DRUGS (NSAID'S)  Using products such as Advil (ibuprofen), Aleve (naproxen), Motrin (ibuprofen) for additional pain control during fracture healing can delay and/or prevent the healing response.  If you would like to take over the counter (OTC) medication, Tylenol (acetaminophen) is ok.  However, some narcotic medications that are given for pain control contain acetaminophen as well. Therefore, you should not exceed more than 4000 mg of tylenol in a day if you do not have liver disease.  Also note that there are may OTC medicines, such as cold medicines and allergy medicines that my contain tylenol as well.  If you have any questions about medications and/or interactions please ask your doctor/PA or your pharmacist.      ICE AND ELEVATE INJURED/OPERATIVE EXTREMITY  Using ice and elevating the injured extremity above your heart can help with swelling and pain control.  Icing in a  pulsatile fashion, such as 20 minutes on and 20 minutes off, can be followed.    Do not place ice directly on skin. Make sure there is a barrier between to skin and the ice pack.    Using frozen items such as frozen peas works well as the conform nicely to the are that needs to be iced.  USE AN ACE WRAP OR TED HOSE FOR SWELLING CONTROL  In addition to icing and elevation, Ace wraps or TED hose are used to help limit and resolve swelling.  It is recommended to use Ace wraps or TED hose until you are informed to stop.    When using Ace Wraps start the wrapping distally (farthest away from the body) and wrap proximally (closer to the body)   Example: If you had surgery on  your leg or thing and you do not have a splint on, start the ace wrap at the toes and work your way up to the thigh        If you had surgery on your upper extremity and do not have a splint on, start the ace wrap at your fingers and work your way up to the upper arm  [place]: [phone]   VISIT OUR WEBSITE FOR ADDITIONAL INFORMATION: [email or web address]     Discharge Wound Care Instructions  Do NOT apply any ointments, solutions or lotions to pin sites or surgical wounds.  These prevent needed drainage and even though solutions like hydrogen peroxide kill bacteria, they also damage cells lining the pin sites that help fight infection.  Applying lotions or ointments can keep the wounds moist and can cause them to breakdown and open up as well. This can increase the risk for infection. When in doubt call the office.  Surgical incisions should be dressed daily.  If any drainage is noted, use one layer of adaptic, then gauze, Kerlix, and an ace wrap.  Once the incision is completely dry and without drainage, it may be left open to air out.  Showering may begin 36-48 hours later.  Cleaning gently with soap and water.  Traumatic wounds should be dressed daily as well.    One layer of  adaptic, gauze, Kerlix, then ace wrap.  The adaptic can be discontinued once the draining has ceased    If you have a wet to dry dressing: wet the gauze with saline the squeeze as much saline out so the gauze is moist (not soaking wet), place moistened gauze over wound, then place a dry gauze over the moist one, followed by Kerlix wrap, then ace wrap.

## 2020-05-12 NOTE — Progress Notes (Signed)
[facility] PHYSICAL MEDICINE & REHABILITATION PROGRESS NOTE   Subjective/Complaints: Discussed XR results with patient and his wife at bedside. Fracture is healing well. He is bright and alert today! Ready for DC home  ROS:  Pt denies SOB, abd pain, CP, N/V/C/D, and vision changes  Objective:   DG FEMUR MIN 2 VIEWS LEFT  Result Date: 05/11/2020 CLINICAL DATA:  Open reduction and internal fixation of left femur fracture. EXAM: LEFT FEMUR 2 VIEWS COMPARISON:  May 02, 2020. FINDINGS: Status post left total hip arthroplasty. Status post surgical internal fixation of periprostatic fracture. This is unchanged compared to prior exam. No new fracture or dislocation is noted. Vascular calcifications are noted. IMPRESSION: Status post left total hip arthroplasty and surgical internal fixation of periprostatic fracture. No significant change compared to prior exam. Electronically Signed   By: Marijo Conception M.D.   On: 05/11/2020 11:10   No results for input(s): WBC, HGB, HCT, PLT in the last 72 hours. No results for input(s): NA, K, CL, CO2, GLUCOSE, BUN, CREATININE, CALCIUM in the last 72 hours.  Intake/Output Summary (Last 24 hours) at 05/12/2020 1559 Last data filed at 05/12/2020 0707 Gross per 24 hour  Intake 120 ml  Output 650 ml  Net -530 ml     Physical Exam: Vital Signs Blood pressure 115/61, pulse (!) 53, temperature 97.8 F (36.6 C), temperature source Oral, resp. rate 18, height 6\' 2"  (1.88 m), weight 100 kg, SpO2 97 %. General: Alert and oriented x 3, No apparent distress HEENT: Head is normocephalic, atraumatic, PERRLA, EOMI, sclera anicteric, oral mucosa pink and moist, dentition intact, ext ear canals clear,  Neck: Supple without JVD or lymphadenopathy Heart: Reg rate and rhythm. No murmurs rubs or gallops Chest: CTA bilaterally without wheezes, rales, or rhonchi; no distress Abdomen: Soft, non-tender, non-distended, bowel sounds positive. Extremities: No clubbing, cyanosis,  or edema. Pulses are 2+ Skin: Clean and intact without signs of breakdown Musculoskeletal:        General: Swelling present.     Cervical back: Normal range of motion.     Comments: Moderate edema left hip to knee. Mild edema L hip- incision looks great. No drainage or erythema. Sutures removed.  Left transmet amputation site well healed. Looks great/scarred  Neurological: He is alert.  Motor: B/l UE: 5/5 proximal to distal RLE: HF: 2+/5, KE 3/5, ADF 4+/5 LLE: HF 2-/5, KE 2+/5, ADF 3/5 HOH- worse than normal since only wearing 1 hearing aid - a little hoarse stil Skin: Skin is warm and dry. Small tear on buttock- not open.  Psychiatric: more awake during exam  Assessment/Plan: 1. Functional deficits secondary to deficits with mobility, endurance, self-care secondary to left periprosthetic femur fracture, which require 3+ hours per day of interdisciplinary therapy in a comprehensive inpatient rehab setting.  Physiatrist is providing close team supervision and 24 hour management of active medical problems listed below.  Physiatrist and rehab team continue to assess barriers to discharge/monitor patient progress toward functional and medical goals  Care Tool:  Bathing  Bathing activity did not occur: Refused Body parts bathed by patient: Right arm, Left arm, Chest, Abdomen, Right upper leg, Left upper leg, Face   Body parts bathed by helper: Right lower leg, Left lower leg, Front perineal area, Buttocks     Bathing assist Assist Level: Maximal Assistance - Patient 24 - 49%     Upper Body Dressing/Undressing Upper body dressing   What is the patient wearing?: Pull over shirt  Upper body assist Assist Level: Set up assist    Lower Body Dressing/Undressing Lower body dressing    Lower body dressing activity did not occur: Refused What is the patient wearing?: Pants, Underwear/pull up     Lower body assist Assist for lower body dressing: Maximal Assistance - Patient 25 -  49%     Toileting Toileting    Toileting assist Assist for toileting: Total Assistance - Patient < 25% (pt able to transfer to [facility], wife performs clothing management and hygiene while pt in partial stand) Assistive Device Comment: Urinal   Transfers Chair/bed transfer  Transfers assist     Chair/bed transfer assist level: Supervision/Verbal cueing     Locomotion Ambulation   Ambulation assist   Ambulation activity did not occur: Safety/medical concerns          Walk 10 feet activity   Assist  Walk 10 feet activity did not occur: Safety/medical concerns        Walk 50 feet activity   Assist Walk 50 feet with 2 turns activity did not occur: Safety/medical concerns         Walk 150 feet activity   Assist Walk 150 feet activity did not occur: Safety/medical concerns         Walk 10 feet on uneven surface  activity   Assist Walk 10 feet on uneven surfaces activity did not occur: Safety/medical concerns         Wheelchair     Assist Will patient use wheelchair at discharge?: Yes Type of Wheelchair: Manual Wheelchair activity did not occur: Refused  Wheelchair assist level: Set up assist Max wheelchair distance: 200'    Wheelchair 50 feet with 2 turns activity    Assist    Wheelchair 50 feet with 2 turns activity did not occur: Refused   Assist Level: Set up assist   Wheelchair 150 feet activity     Assist  Wheelchair 150 feet activity did not occur: Safety/medical concerns   Assist Level: Set up assist   Blood pressure 115/61, pulse (!) 53, temperature 97.8 F (36.6 C), temperature source Oral, resp. rate 18, height 6\' 2"  (1.88 m), weight 100 kg, SpO2 97 %.    Medical Problem List and Plan: 1.  Deficits with mobility, endurance, self-care secondary to left periprosthetic femur fracture.              -patient may not  shower             -ELOS/Goals: 10-14 days/Min A            - DC home today.   -f/u with Dr.  Ranell Patrick in 1-2 weeks 2.  Antithrombotics: -DVT/anticoagulation:  Mechanical: Sequential compression devices, below knee Bilateral lower extremities Will check dopplers.             -antiplatelet therapy: N/A 3. Pain Management: Has been on hydrocodone 5 mg every 8 hours. Changed to PRN as sedating patient and it makes more sense for him to take before therapy sessions.   6/21- pain for the first time controlled per PT this AM- con't prn Norco  6/24- pain in general worse- wants something in between pain meds prn- will add tramadol 50 mg q6 hours prn in between Norco dosing  6/25- pain MUCH improved with alternating tramadol and oxy  7/1: well controlled. Hip XR results pending.   7/2: Hip fracture healing well on XR             Monitor with  increased exertion 4. Mood: LCSW to follow for evaluation and support.              -antipsychotic agents: N/a 5. Neuropsych: This patient is not fully capable of making decisions on his own behalf. 6. Skin/Wound Care: Monitor wound for healing.   6/19: Small skin tear on buttock- continue to monitor.  7. Fluids/Electrolytes/Nutrition: Monitor I/O.  8. HTN: Monitor BP tid- continue Norvasc.             Monitor with increased mobility.   7/2: well controlled- can consider decreasing Norvasc outpatient.  9. T2DM with hyperglycemia: Monitor BS ac/hs.  Lantus increased to 15 units bid today. Now on Lantus bid with SSI for elevated BS.   6/18: CBG has been as low as 47. Decrease Lantus to 14U BID  6/19: CBG down to 69 this morning. Decrease Lantus to 13U BID  6/27- BGs 63-132 in last 24 hours- will decrease to 12 units, since sometimes sig. Elevated as well- due to renal fxn, don't want to add oral agents   CBG (last 3)  Recent Labs    05/11/20 2121 05/12/20 0614 05/12/20 0656  GLUCAP 124* 52* 75   7/2: better controlled 10. COPD: Wean oxygen as tolerated. Continue Breo/Incruse.  6/26- used O2 intermittently at home prior- cannot seem to wean off O2-  will d/w nursing.  6/29- d/w nursing to wean O2 - if possible- per PT dropped to 85% off O2 in gym- might need to go home on O2-  6/30- will likely need O2 at d/c- please get PT to address 11. Acute on chronic renal failure: Trending to baseline line? Will order PVRs to monitor voiding.             Creatinine trending upward on 6/17: Bolus 250cc  6/18: Down to 2.3  6/21- Cr 2.19- appears to be baseline around 2.19 and BUN 55  6/22- Cr 2.29- today- not sure if due to keflex- but is still on lower side  6/24- Cr down to 2.01- much improved- con't regimen  6/28: Cr 1.94.   12. ABLA/Hematuria: Continues--will continue to monitor H/H. Off Plavix and Lovenox at this time.             Hgb trending downward on 6/17. Up to 7.6 on 6/18.   6/28: Hgb stable.  13. Constipation: Had BM 6/19 with enema. Increased laxatives to colace TID and Senna HS  6/21- cleaned out yesterday 14. UTI -Klebiella Oxytoca   6/29- off Rocephin now 15. Urinary retention- needing in/out caths  6/24- on max dose of Flomax- no rash since allergic to Sulfa-  6/25 -still requiring icaths  6/26- will have wife taught how to in/out cath since no voiding at this time- no Sx's of reaction.  6/27- no voiding still- will need f/u with Urology since not sure why not voiding- wife was clear was "slow voider" prior to admission   6/29- still not voiding- on max dose Flomax  6/30- pt having urinary retention- no voiding even with in/out caths and max dose of Flomax- need caths 4-5x/day. SW to get pt caths at home.   >30 minutes spent in discussion on medications, follow-up appointments, review of BP, CBGs, and XR with patient and wife     LOS: 16 days A FACE TO FACE EVALUATION WAS PERFORMED  Martha Clan P Lovenia Debruler 05/12/2020, 3:59 PM

## 2020-05-12 NOTE — Progress Notes (Signed)
Hypoglycemic Event  CBG: 52  Treatment: 4 oz juice  Symptoms: none  Follow-up CBG: Time: 0645 CBG Result:75  Possible Reasons for Event: unk  Comments/MD notified:Dan [place] PA    Antoine Primas

## 2020-05-12 NOTE — Telephone Encounter (Signed)
Transition Care Management Follow-up Telephone Call  Date of discharge and from where[facility]  How have you been since you were released from the hospital? Patient is doing okay. Just got home and is resting.  Any questions or concerns? No   Items Reviewed:  Did the pt receive and understand the discharge instructions provided? Yes   Medications obtained and verified? Yes   Any new allergies since your discharge? No   Dietary orders reviewed? Yes  Do you have support at home? Yes   Functional Questionnaire: (I = Independent and D = Dependent) ADLs: D  Bathing/Dressing- D  Meal Prep- D  Eating- I  Maintaining continence- I  Transferring/Ambulation- D  Managing Meds- D  Follow up appointments reviewed:   PCP Hospital f/u appt confirmed? No  wants to wait until he is able to walk better. Will call back in a week or two to schedule a follow up visit.  [facility] f/u appt confirmed? Yes  Scheduled to see orthopedics   Are transportation arrangements needed? No   If their condition worsens, is the pt aware to call PCP or go to the Emergency Dept.? Yes  Was the patient provided with contact information for the PCP's office or ED? Yes  Was to pt encouraged to call back with questions or concerns? Yes

## 2020-05-12 NOTE — Plan of Care (Signed)
  Problem: Consults Goal: RH GENERAL PATIENT EDUCATION Description: See Patient Education module for education specifics. Outcome: Completed/Met   Problem: RH BOWEL ELIMINATION Goal: RH STG MANAGE BOWEL WITH ASSISTANCE Description: STG Manage Bowel with Min Assistance. Outcome: Completed/Met   Problem: RH BLADDER ELIMINATION Goal: RH STG MANAGE BLADDER WITH ASSISTANCE Description: STG Manage Bladder With Min Assistance Outcome: Completed/Met   Problem: RH SKIN INTEGRITY Goal: RH STG SKIN FREE OF INFECTION/BREAKDOWN Description: No new breakdown with min assist  Outcome: Completed/Met Goal: RH STG ABLE TO PERFORM INCISION/WOUND CARE W/ASSISTANCE Description: STG Able To Perform Incision/Wound Care With [employer]. Outcome: Completed/Met   Problem: RH SAFETY Goal: RH STG ADHERE TO SAFETY PRECAUTIONS W/ASSISTANCE/DEVICE Description: STG Adhere to Safety Precautions With Min Assistance/Device. Outcome: Completed/Met   Problem: RH PAIN MANAGEMENT Goal: RH STG PAIN MANAGED AT OR BELOW PT'S PAIN GOAL Description: < 3 out of 10.  Outcome: Completed/Met

## 2020-05-12 NOTE — Progress Notes (Signed)
Inpatient Rehabilitation Care Coordinator  Discharge Note  The overall goal for the admission was met for:   Discharge location: Yes. D/c to home with 24/7 care from wife; intermittent support from children.  Length of Stay: Yes. 15 days.   Discharge activity level: Yes. Mod A.  Home/community participation: Yes. Limited.   Services provided included: MD, RD, PT, OT, SLP, RN, CM, TR, Pharmacy, Amanda: Private Insurance: [facility] Medicare and VA  Follow-up services arranged: [facility]: [facility] for HHPT/[facility], DME: [employer] [facility]- wheelchair; [employer]-catheters 88f; [place]- drop arm bedside commode/ramp/catheters and Patient/Family request agency HH[facility], DME: N/A *[place] by [place]   Comments (or additional information): contact pt wife DTamela Oddi#[phone] Patient/Family verbalized understanding of follow-up arrangements: Yes  Individual responsible for coordination of the follow-up plan: Pt to have assistance with coordinating care needs.   Confirmed correct DME delivered: ARana Snare7/12/2019    ARana Snare

## 2020-05-12 NOTE — Progress Notes (Signed)
Patient ID: [name] Small, male   DOB: June 22, 1928, 86 y.o.   MRN: [id number]  Patient was supplied with morning medications, received a clean and brief change post-incontinent BM, and pt and wife were given I/O catheter kits (+extra lidocaine gel, lubricant, and iodine) for home use until home supply is received by mail. This nurse witnessed pt's wife cath pt t-2, and trusts she is well educated to perform intervention at home. [place], Lavon Paganini, PA-C was informed that pt and wife are ready for AVS review.

## 2020-05-14 ENCOUNTER — Inpatient Hospital Stay (HOSPITAL_COMMUNITY)
Admission: EM | Admit: 2020-05-14 | Discharge: 2020-05-16 | DRG: 378 | Disposition: A | Discharge status: Home Health | Payer: Medicare Other | Attending: Internal Medicine | Admitting: Internal Medicine

## 2020-05-14 ENCOUNTER — Other Ambulatory Visit: Payer: Self-pay

## 2020-05-14 ENCOUNTER — Emergency Department (HOSPITAL_COMMUNITY): Payer: Medicare Other

## 2020-05-14 ENCOUNTER — Encounter (HOSPITAL_COMMUNITY): Payer: Self-pay | Admitting: Emergency Medicine

## 2020-05-14 DIAGNOSIS — J449 Chronic obstructive pulmonary disease, unspecified: Secondary | ICD-10-CM | POA: Diagnosis present

## 2020-05-14 DIAGNOSIS — Z96649 Presence of unspecified artificial hip joint: Secondary | ICD-10-CM

## 2020-05-14 DIAGNOSIS — K920 Hematemesis: Principal | ICD-10-CM | POA: Diagnosis present

## 2020-05-14 DIAGNOSIS — I2510 Atherosclerotic heart disease of native coronary artery without angina pectoris: Secondary | ICD-10-CM | POA: Diagnosis present

## 2020-05-14 DIAGNOSIS — C679 Malignant neoplasm of bladder, unspecified: Secondary | ICD-10-CM | POA: Diagnosis present

## 2020-05-14 DIAGNOSIS — E869 Volume depletion, unspecified: Secondary | ICD-10-CM | POA: Diagnosis not present

## 2020-05-14 DIAGNOSIS — I5032 Chronic diastolic (congestive) heart failure: Secondary | ICD-10-CM | POA: Diagnosis not present

## 2020-05-14 DIAGNOSIS — N261 Atrophy of kidney (terminal): Secondary | ICD-10-CM | POA: Diagnosis not present

## 2020-05-14 DIAGNOSIS — Z7189 Other specified counseling: Secondary | ICD-10-CM | POA: Diagnosis not present

## 2020-05-14 DIAGNOSIS — Z7401 Bed confinement status: Secondary | ICD-10-CM

## 2020-05-14 DIAGNOSIS — D62 Acute posthemorrhagic anemia: Secondary | ICD-10-CM | POA: Diagnosis not present

## 2020-05-14 DIAGNOSIS — K3189 Other diseases of stomach and duodenum: Secondary | ICD-10-CM | POA: Diagnosis not present

## 2020-05-14 DIAGNOSIS — N183 Chronic kidney disease, stage 3 unspecified: Secondary | ICD-10-CM | POA: Diagnosis not present

## 2020-05-14 DIAGNOSIS — I130 Hypertensive heart and chronic kidney disease with heart failure and stage 1 through stage 4 chronic kidney disease, or unspecified chronic kidney disease: Secondary | ICD-10-CM | POA: Diagnosis present

## 2020-05-14 DIAGNOSIS — Z03818 Encounter for observation for suspected exposure to other biological agents ruled out: Secondary | ICD-10-CM | POA: Diagnosis not present

## 2020-05-14 DIAGNOSIS — L988 Other specified disorders of the skin and subcutaneous tissue: Secondary | ICD-10-CM | POA: Diagnosis present

## 2020-05-14 DIAGNOSIS — I25810 Atherosclerosis of coronary artery bypass graft(s) without angina pectoris: Secondary | ICD-10-CM | POA: Diagnosis present

## 2020-05-14 DIAGNOSIS — I4891 Unspecified atrial fibrillation: Secondary | ICD-10-CM | POA: Diagnosis not present

## 2020-05-14 DIAGNOSIS — R339 Retention of urine, unspecified: Secondary | ICD-10-CM | POA: Diagnosis not present

## 2020-05-14 DIAGNOSIS — W19XXXD Unspecified fall, subsequent encounter: Secondary | ICD-10-CM | POA: Diagnosis present

## 2020-05-14 DIAGNOSIS — Z515 Encounter for palliative care: Secondary | ICD-10-CM | POA: Diagnosis not present

## 2020-05-14 DIAGNOSIS — Z886 Allergy status to analgesic agent status: Secondary | ICD-10-CM

## 2020-05-14 DIAGNOSIS — S7292XA Unspecified fracture of left femur, initial encounter for closed fracture: Secondary | ICD-10-CM | POA: Diagnosis present

## 2020-05-14 DIAGNOSIS — K209 Esophagitis, unspecified without bleeding: Secondary | ICD-10-CM | POA: Diagnosis not present

## 2020-05-14 DIAGNOSIS — R12 Heartburn: Secondary | ICD-10-CM | POA: Diagnosis not present

## 2020-05-14 DIAGNOSIS — E785 Hyperlipidemia, unspecified: Secondary | ICD-10-CM | POA: Diagnosis not present

## 2020-05-14 DIAGNOSIS — K2990 Gastroduodenitis, unspecified, without bleeding: Secondary | ICD-10-CM | POA: Diagnosis not present

## 2020-05-14 DIAGNOSIS — Z20822 Contact with and (suspected) exposure to covid-19: Secondary | ICD-10-CM | POA: Diagnosis not present

## 2020-05-14 DIAGNOSIS — Z7951 Long term (current) use of inhaled steroids: Secondary | ICD-10-CM

## 2020-05-14 DIAGNOSIS — Z87891 Personal history of nicotine dependence: Secondary | ICD-10-CM

## 2020-05-14 DIAGNOSIS — K2970 Gastritis, unspecified, without bleeding: Secondary | ICD-10-CM

## 2020-05-14 DIAGNOSIS — E1165 Type 2 diabetes mellitus with hyperglycemia: Secondary | ICD-10-CM | POA: Diagnosis present

## 2020-05-14 DIAGNOSIS — R0902 Hypoxemia: Secondary | ICD-10-CM | POA: Diagnosis not present

## 2020-05-14 DIAGNOSIS — R159 Full incontinence of feces: Secondary | ICD-10-CM | POA: Diagnosis present

## 2020-05-14 DIAGNOSIS — R109 Unspecified abdominal pain: Secondary | ICD-10-CM | POA: Diagnosis not present

## 2020-05-14 DIAGNOSIS — M48062 Spinal stenosis, lumbar region with neurogenic claudication: Secondary | ICD-10-CM | POA: Diagnosis present

## 2020-05-14 DIAGNOSIS — K922 Gastrointestinal hemorrhage, unspecified: Secondary | ICD-10-CM | POA: Diagnosis present

## 2020-05-14 DIAGNOSIS — K2100 Gastro-esophageal reflux disease with esophagitis, without bleeding: Secondary | ICD-10-CM

## 2020-05-14 DIAGNOSIS — I70202 Unspecified atherosclerosis of native arteries of extremities, left leg: Secondary | ICD-10-CM | POA: Diagnosis present

## 2020-05-14 DIAGNOSIS — Z9981 Dependence on supplemental oxygen: Secondary | ICD-10-CM

## 2020-05-14 DIAGNOSIS — R58 Hemorrhage, not elsewhere classified: Secondary | ICD-10-CM | POA: Diagnosis not present

## 2020-05-14 DIAGNOSIS — R32 Unspecified urinary incontinence: Secondary | ICD-10-CM | POA: Diagnosis present

## 2020-05-14 DIAGNOSIS — E1169 Type 2 diabetes mellitus with other specified complication: Secondary | ICD-10-CM

## 2020-05-14 DIAGNOSIS — Z8249 Family history of ischemic heart disease and other diseases of the circulatory system: Secondary | ICD-10-CM

## 2020-05-14 DIAGNOSIS — R531 Weakness: Secondary | ICD-10-CM | POA: Diagnosis not present

## 2020-05-14 DIAGNOSIS — E1142 Type 2 diabetes mellitus with diabetic polyneuropathy: Secondary | ICD-10-CM | POA: Diagnosis not present

## 2020-05-14 DIAGNOSIS — Z800 Family history of malignant neoplasm of digestive organs: Secondary | ICD-10-CM

## 2020-05-14 DIAGNOSIS — I5033 Acute on chronic diastolic (congestive) heart failure: Secondary | ICD-10-CM | POA: Diagnosis present

## 2020-05-14 DIAGNOSIS — Z801 Family history of malignant neoplasm of trachea, bronchus and lung: Secondary | ICD-10-CM

## 2020-05-14 DIAGNOSIS — K921 Melena: Secondary | ICD-10-CM | POA: Diagnosis not present

## 2020-05-14 DIAGNOSIS — Z888 Allergy status to other drugs, medicaments and biological substances status: Secondary | ICD-10-CM

## 2020-05-14 DIAGNOSIS — I700 Atherosclerosis of aorta: Secondary | ICD-10-CM | POA: Diagnosis not present

## 2020-05-14 DIAGNOSIS — Z882 Allergy status to sulfonamides status: Secondary | ICD-10-CM

## 2020-05-14 DIAGNOSIS — S728X2D Other fracture of left femur, subsequent encounter for closed fracture with routine healing: Secondary | ICD-10-CM | POA: Diagnosis not present

## 2020-05-14 DIAGNOSIS — Z9181 History of falling: Secondary | ICD-10-CM

## 2020-05-14 DIAGNOSIS — K2210 Ulcer of esophagus without bleeding: Secondary | ICD-10-CM

## 2020-05-14 DIAGNOSIS — R590 Localized enlarged lymph nodes: Secondary | ICD-10-CM | POA: Diagnosis not present

## 2020-05-14 DIAGNOSIS — N401 Enlarged prostate with lower urinary tract symptoms: Secondary | ICD-10-CM | POA: Diagnosis present

## 2020-05-14 DIAGNOSIS — R338 Other retention of urine: Secondary | ICD-10-CM | POA: Diagnosis present

## 2020-05-14 DIAGNOSIS — Z79899 Other long term (current) drug therapy: Secondary | ICD-10-CM

## 2020-05-14 DIAGNOSIS — Z833 Family history of diabetes mellitus: Secondary | ICD-10-CM

## 2020-05-14 DIAGNOSIS — J9611 Chronic respiratory failure with hypoxia: Secondary | ICD-10-CM | POA: Diagnosis not present

## 2020-05-14 DIAGNOSIS — N1832 Chronic kidney disease, stage 3b: Secondary | ICD-10-CM | POA: Diagnosis not present

## 2020-05-14 DIAGNOSIS — E1122 Type 2 diabetes mellitus with diabetic chronic kidney disease: Secondary | ICD-10-CM | POA: Diagnosis not present

## 2020-05-14 DIAGNOSIS — J438 Other emphysema: Secondary | ICD-10-CM | POA: Diagnosis not present

## 2020-05-14 DIAGNOSIS — S72309D Unspecified fracture of shaft of unspecified femur, subsequent encounter for closed fracture with routine healing: Secondary | ICD-10-CM

## 2020-05-14 DIAGNOSIS — I10 Essential (primary) hypertension: Secondary | ICD-10-CM | POA: Diagnosis present

## 2020-05-14 DIAGNOSIS — I739 Peripheral vascular disease, unspecified: Secondary | ICD-10-CM | POA: Diagnosis present

## 2020-05-14 DIAGNOSIS — Z743 Need for continuous supervision: Secondary | ICD-10-CM | POA: Diagnosis not present

## 2020-05-14 DIAGNOSIS — Z794 Long term (current) use of insulin: Secondary | ICD-10-CM

## 2020-05-14 DIAGNOSIS — E119 Type 2 diabetes mellitus without complications: Secondary | ICD-10-CM | POA: Diagnosis present

## 2020-05-14 DIAGNOSIS — E1151 Type 2 diabetes mellitus with diabetic peripheral angiopathy without gangrene: Secondary | ICD-10-CM | POA: Diagnosis present

## 2020-05-14 DIAGNOSIS — K2950 Unspecified chronic gastritis without bleeding: Secondary | ICD-10-CM | POA: Diagnosis not present

## 2020-05-14 DIAGNOSIS — K5730 Diverticulosis of large intestine without perforation or abscess without bleeding: Secondary | ICD-10-CM | POA: Diagnosis not present

## 2020-05-14 DIAGNOSIS — Z8551 Personal history of malignant neoplasm of bladder: Secondary | ICD-10-CM

## 2020-05-14 DIAGNOSIS — Z993 Dependence on wheelchair: Secondary | ICD-10-CM

## 2020-05-14 LAB — COMPREHENSIVE METABOLIC PANEL
ALT: 10 U/L (ref 0–44)
AST: 18 U/L (ref 15–41)
Albumin: 3.4 g/dL — ABNORMAL LOW (ref 3.5–5.0)
Alkaline Phosphatase: 86 U/L (ref 38–126)
Anion gap: 11 (ref 5–15)
BUN: 27 mg/dL — ABNORMAL HIGH (ref 8–23)
CO2: 26 mmol/L (ref 22–32)
Calcium: 7.9 mg/dL — ABNORMAL LOW (ref 8.9–10.3)
Chloride: 98 mmol/L (ref 98–111)
Creatinine, Ser: 1.74 mg/dL — ABNORMAL HIGH (ref 0.61–1.24)
GFR calc Af Amer: 39 mL/min — ABNORMAL LOW (ref >60)
GFR calc non Af Amer: 34 mL/min — ABNORMAL LOW (ref >60)
Glucose, Bld: 251 mg/dL — ABNORMAL HIGH (ref 70–99)
Potassium: 4.6 mmol/L (ref 3.5–5.1)
Sodium: 135 mmol/L (ref 135–145)
Total Bilirubin: 1.1 mg/dL (ref 0.3–1.2)
Total Protein: 6.9 g/dL (ref 6.5–8.1)

## 2020-05-14 LAB — CBC WITH DIFFERENTIAL/PLATELET
Abs Immature Granulocytes: 0.09 10*3/uL — ABNORMAL HIGH (ref 0.00–0.07)
Basophils Absolute: 0.1 10*3/uL (ref 0.0–0.1)
Basophils Relative: 1 %
Eosinophils Absolute: 0 10*3/uL (ref 0.0–0.5)
Eosinophils Relative: 0 %
HCT: 37.3 % — ABNORMAL LOW (ref 39.0–52.0)
Hemoglobin: 11.6 g/dL — ABNORMAL LOW (ref 13.0–17.0)
Immature Granulocytes: 1 %
Lymphocytes Relative: 6 %
Lymphs Abs: 0.7 10*3/uL (ref 0.7–4.0)
MCH: 32.7 pg (ref 26.0–34.0)
MCHC: 31.1 g/dL (ref 30.0–36.0)
MCV: 105.1 fL — ABNORMAL HIGH (ref 80.0–100.0)
Monocytes Absolute: 0.8 10*3/uL (ref 0.1–1.0)
Monocytes Relative: 8 %
Neutro Abs: 8.6 10*3/uL — ABNORMAL HIGH (ref 1.7–7.7)
Neutrophils Relative %: 84 %
Platelets: 255 10*3/uL (ref 150–400)
RBC: 3.55 MIL/uL — ABNORMAL LOW (ref 4.22–5.81)
RDW: 14.7 % (ref 11.5–15.5)
WBC: 10.2 10*3/uL (ref 4.0–10.5)
nRBC: 0 % (ref 0.0–0.2)

## 2020-05-14 LAB — TYPE AND SCREEN
ABO/RH(D): A POS
Antibody Screen: NEGATIVE

## 2020-05-14 LAB — CBC
HCT: 35.1 % — ABNORMAL LOW (ref 39.0–52.0)
Hemoglobin: 10.9 g/dL — ABNORMAL LOW (ref 13.0–17.0)
MCH: 32.8 pg (ref 26.0–34.0)
MCHC: 31.1 g/dL (ref 30.0–36.0)
MCV: 105.7 fL — ABNORMAL HIGH (ref 80.0–100.0)
Platelets: 286 10*3/uL (ref 150–400)
RBC: 3.32 MIL/uL — ABNORMAL LOW (ref 4.22–5.81)
RDW: 14.7 % (ref 11.5–15.5)
WBC: 9.9 10*3/uL (ref 4.0–10.5)
nRBC: 0 % (ref 0.0–0.2)

## 2020-05-14 LAB — SARS CORONAVIRUS 2 BY RT PCR (HOSPITAL ORDER, PERFORMED IN ~~LOC~~ HOSPITAL LAB): SARS Coronavirus 2: NEGATIVE

## 2020-05-14 LAB — POC OCCULT BLOOD, ED: Fecal Occult Bld: NEGATIVE

## 2020-05-14 LAB — GLUCOSE, CAPILLARY
Glucose-Capillary: 165 mg/dL — ABNORMAL HIGH (ref 70–99)
Glucose-Capillary: 159 mg/dL — ABNORMAL HIGH (ref 70–99)

## 2020-05-14 LAB — PROTIME-INR
INR: 1 (ref 0.8–1.2)
Prothrombin Time: 12.9 seconds (ref 11.4–15.2)

## 2020-05-14 LAB — TSH: TSH: 1.448 u[IU]/mL (ref 0.350–4.500)

## 2020-05-14 LAB — APTT: aPTT: 33 seconds (ref 24–36)

## 2020-05-14 LAB — LIPASE, BLOOD: Lipase: 25 U/L (ref 11–51)

## 2020-05-14 MED ORDER — INSULIN ASPART 100 UNIT/ML ~~LOC~~ SOLN: 4.0000 [IU] | Freq: Three times a day (TID) | SUBCUTANEOUS | Status: DC

## 2020-05-14 MED ORDER — METHOCARBAMOL 500 MG PO TABS
500.0000 mg | ORAL_TABLET | Freq: Four times a day (QID) | ORAL | Status: DC | PRN
  Administered 2020-05-14: 500 mg via ORAL
  Filled 2020-05-14: qty 1

## 2020-05-14 MED ORDER — POLYETHYLENE GLYCOL 3350 17 G PO PACK: 17.0000 g | PACK | Freq: Every day | ORAL | Status: DC

## 2020-05-14 MED ORDER — DOCUSATE SODIUM 100 MG PO CAPS
100.0000 mg | ORAL_CAPSULE | Freq: Three times a day (TID) | ORAL | Status: DC
  Administered 2020-05-14 – 2020-05-15 (×4): 100 mg via ORAL
  Filled 2020-05-14 (×4): qty 1

## 2020-05-14 MED ORDER — MELATONIN 5 MG PO TABS
5.0000 mg | ORAL_TABLET | Freq: Every evening | ORAL | Status: DC | PRN
  Administered 2020-05-15: 5 mg via ORAL
  Filled 2020-05-14: qty 1

## 2020-05-14 MED ORDER — TRAMADOL HCL 50 MG PO TABS
50.0000 mg | ORAL_TABLET | Freq: Four times a day (QID) | ORAL | Status: DC
  Administered 2020-05-14 – 2020-05-16 (×7): 50 mg via ORAL
  Filled 2020-05-14 (×8): qty 1

## 2020-05-14 MED ORDER — MOMETASONE FURO-FORMOTEROL FUM 200-5 MCG/ACT IN AERO
2.0000 | INHALATION_SPRAY | Freq: Two times a day (BID) | RESPIRATORY_TRACT | Status: DC
  Administered 2020-05-15 (×2): 2 via RESPIRATORY_TRACT
  Filled 2020-05-14: qty 8.8

## 2020-05-14 MED ORDER — INSULIN ASPART 100 UNIT/ML ~~LOC~~ SOLN: 0.0000 [IU] | Freq: Every day | SUBCUTANEOUS | Status: DC

## 2020-05-14 MED ORDER — INSULIN ASPART 100 UNIT/ML ~~LOC~~ SOLN
0.0000 [IU] | Freq: Three times a day (TID) | SUBCUTANEOUS | Status: DC
  Administered 2020-05-14 – 2020-05-15 (×2): 3 [IU] via SUBCUTANEOUS

## 2020-05-14 MED ORDER — UMECLIDINIUM BROMIDE 62.5 MCG/INH IN AEPB
1.0000 | INHALATION_SPRAY | Freq: Every day | RESPIRATORY_TRACT | Status: DC
  Filled 2020-05-14: qty 7

## 2020-05-14 MED ORDER — TAMSULOSIN HCL 0.4 MG PO CAPS
0.8000 mg | ORAL_CAPSULE | Freq: Every day | ORAL | Status: DC
  Administered 2020-05-14 – 2020-05-15 (×2): 0.8 mg via ORAL
  Filled 2020-05-14 (×2): qty 2

## 2020-05-14 MED ORDER — TIOTROPIUM BROMIDE MONOHYDRATE 18 MCG IN CAPS: 18.0000 ug | ORAL_CAPSULE | Freq: Every day | RESPIRATORY_TRACT | Status: DC

## 2020-05-14 MED ORDER — PANTOPRAZOLE SODIUM 40 MG IV SOLR
40.0000 mg | Freq: Once | INTRAVENOUS | Status: AC
  Administered 2020-05-15: 40 mg via INTRAVENOUS
  Filled 2020-05-14: qty 40

## 2020-05-14 MED ORDER — APREMILAST 30 MG PO TABS: 1.0000 | ORAL_TABLET | Freq: Every day | ORAL | Status: DC

## 2020-05-14 MED ORDER — SODIUM CHLORIDE 0.9 % IV SOLN: INTRAVENOUS | Status: DC

## 2020-05-14 MED ORDER — ESCITALOPRAM OXALATE 10 MG PO TABS
5.0000 mg | ORAL_TABLET | Freq: Every day | ORAL | Status: DC
  Administered 2020-05-14 – 2020-05-15 (×2): 5 mg via ORAL
  Filled 2020-05-14 (×2): qty 1

## 2020-05-14 MED ORDER — POLYSACCHARIDE IRON COMPLEX 150 MG PO CAPS
150.0000 mg | ORAL_CAPSULE | Freq: Every day | ORAL | Status: DC
  Administered 2020-05-14 – 2020-05-15 (×2): 150 mg via ORAL
  Filled 2020-05-14 (×2): qty 1

## 2020-05-14 MED ORDER — SENNA 8.6 MG PO TABS
2.0000 | ORAL_TABLET | Freq: Every day | ORAL | Status: DC
  Administered 2020-05-14 – 2020-05-15 (×2): 17.2 mg via ORAL
  Filled 2020-05-14 (×2): qty 2

## 2020-05-14 MED ORDER — HYDROCODONE-ACETAMINOPHEN 5-325 MG PO TABS
1.0000 | ORAL_TABLET | Freq: Four times a day (QID) | ORAL | Status: DC | PRN
  Administered 2020-05-14 – 2020-05-15 (×2): 1 via ORAL
  Filled 2020-05-14 (×3): qty 1

## 2020-05-14 MED ORDER — ENOXAPARIN SODIUM 30 MG/0.3ML ~~LOC~~ SOLN
30.0000 mg | SUBCUTANEOUS | Status: DC
  Administered 2020-05-14: 30 mg via SUBCUTANEOUS
  Filled 2020-05-14: qty 0.3

## 2020-05-14 MED ORDER — PROMETHAZINE HCL 25 MG/ML IJ SOLN: 6.2500 mg | Freq: Four times a day (QID) | INTRAMUSCULAR | Status: DC | PRN

## 2020-05-14 MED ORDER — INSULIN GLARGINE 100 UNIT/ML ~~LOC~~ SOLN
5.0000 [IU] | Freq: Every day | SUBCUTANEOUS | Status: DC
  Administered 2020-05-14 – 2020-05-15 (×2): 5 [IU] via SUBCUTANEOUS
  Filled 2020-05-14 (×3): qty 0.05

## 2020-05-14 MED ORDER — ATORVASTATIN CALCIUM 10 MG PO TABS
10.0000 mg | ORAL_TABLET | Freq: Every day | ORAL | Status: DC
  Administered 2020-05-14 – 2020-05-15 (×2): 10 mg via ORAL
  Filled 2020-05-14 (×2): qty 1

## 2020-05-14 NOTE — H&P (Signed)
History and Physical    BAKER MORONTA IOX:[id number] DOB: 05-Sep-1928 DOA: 05/14/2020  PCP: Tonia Ghent, MD  Patient coming from: Home  I have personally briefly reviewed patient's old medical records in [place]  Chief Complaint: Coffee-ground emesis  HPI: ZAXTON ANGERER is a 86 y.o. male with medical history significant of coronary artery disease, COPD on home O2, diabetes type 2, hypertension, chronic kidney disease stage IIIb, HFpEF, peripheral vascular disease, bladder cancer, status post left hip replacement with recent left femoral fracture following a fall in the early part of June.  He has been in inpatient rehab from [id number] and was just sent home.  He was doing well at home for approximately 1 day when he began to develop nausea all day yesterday.  He then began to get sick and had coffee-ground emesis for multiple episodes yesterday.  Having regular soft dark black stools.  His wife reports 2 wounds on his backside.  He has been wheelchair-bound or bedbound since his surgery and is still having difficulty bearing weight on his left side.  She reports has had poor appetite but no abdominal pain.  He is currently incontinent of both stool and urine at home.  She is working to get the house set up with a wheelchair ramp. ED Course: In the ED he was noted to have hemoglobin of 11.6 and abnormal blood sugar of 251 and normal INR at 1.0.  Review of Systems: As per HPI otherwise 10 point review of systems negative.   Past Medical History:  Diagnosis Date  . (HFpEF) heart failure with preserved ejection fraction ([place])   . Arthritis   . Back pain    s/p lumbar injection 2014  . BENIGN PROSTATIC HYPERTROPHY 05/21/2007  . Bladder cancer ([place]) 10/2011  . CAD (coronary artery disease)    nonobstructive by cath 6/12:  mid LAD 30%, proximal obtuse marginal-2 30%, proximal RCA 20%, mid RCA 30-40%.  He had normal cardiac output and mildly elevated filling pressures but no  significant pulmonary hypertension;   Echocardiogram in May 2012 demonstrated EF 50-55% and left atrial enlargement   . Cellulitis of left foot    Hospitalized in 2006  . Chronic kidney disease (CKD), stage III (moderate) 12/04/2007   FOLLOWED BY DR PATEL  . Chronic pain of lower extremity   . Complication of anesthesia    1 time bladder cancer surgery- 2012 , oxygen satruratioon dropped had to stay overnight, no problems since then.  Marland Kitchen COPD (chronic obstructive pulmonary disease) ([place])   . DIABETES MELLITUS, TYPE II 05/21/2007  . DIVERTICULOSIS, COLON 05/21/2007   pt denies  . Dyspnea    with exertion, patient mointors saturation  . Fatigue AGE-RELATED  . Gross hematuria    pt denies  . HYPERTENSION 05/21/2007  . Impaired hearing BILATERAL HEARING AIDS  . On home oxygen therapy    as needed  . PAD (peripheral artery disease) ([place])    a. 02/2016 L foot nonhealing ulcer-->Periph angio: L pop 100 w/ reconstitution via extensive vollats to the prox-mid peroneal (only patent vessel BK)-->PTA of L Pop and peroneal; b. Ongoing LE ischemia 5 & 06/2016 req amputation of toes on L foot.  . Pneumonia    Hx  of   . Psoriasis ELBOWS  . Psoriasis   . PSORIASIS, SCALP 10/25/2008    Past Surgical History:  Procedure Laterality Date  . AMPUTATION Left 03/27/2016   Procedure: partial first AMPUTATION RAY; LEFT;  Surgeon: Rodman Key  Lynwood Dawley, DPM;  Location: [facility];  Service: Podiatry;  Laterality: Left;  . AMPUTATION Left 06/12/2016   Procedure: TRANSMETATARSAL AMPUTATION LEFT FOOT;  Surgeon: Newt Minion, MD;  Location: [facility];  Service: Orthopedics;  Laterality: Left;  . APPENDECTOMY  1941  . CARDIAC CATHETERIZATION  04-24-11/  DR Rogue Jury ARIDA   MILD NONOBSTRUCTIVE CAD, [place]MA CARDIAC OUTPUT  . CARDIOVASCULAR STRESS TEST  2007  . CATARACT EXTRACTION W/ INTRAOCULAR LENS  IMPLANT, BILATERAL Bilateral ~ 2010  . CYSTOSCOPY  12/25/2011   Procedure: CYSTOSCOPY;  Surgeon: Molli Hazard, MD;   Location: [facility];  Service: Urology;  Laterality: N/A;  needs intubation and to be paralyzed   . CYSTOSCOPY W/ RETROGRADES  10/30/2011   Procedure: CYSTOSCOPY WITH RETROGRADE PYELOGRAM;  Surgeon: Molli Hazard, MD;  Location: [facility];  Service: Urology;  Laterality: Bilateral;  CYSTOSCOPY POSS TURBT BILATERAL RETROGRADE PYLEOGRAM   . CYSTOSCOPY W/ RETROGRADES  11/30/2012   Procedure: CYSTOSCOPY WITH RETROGRADE PYELOGRAM;  Surgeon: Molli Hazard, MD;  Location[facility];  Service: Urology;  Laterality: Bilateral;  Flexible cystoscopy.  . CYSTOSCOPY WITH BIOPSY  11/30/2012   Procedure: CYSTOSCOPY WITH BIOPSY;  Surgeon: Molli Hazard, MD;  Location: [facility];  Service: Urology;  Laterality: N/A;  . INCISION AND DRAINAGE FOOT Left ~ 2005   LEFT FOOT DUE TO INFECTION FROM  NAIL PUNCTURE INJURY  . LUMBAR LAMINECTOMY/DECOMPRESSION MICRODISCECTOMY N/A 10/18/2016   Procedure: LEFT AND CENTRAL L4-5 LUMBAR LAMINECTOMY WITH RESECTION OF SYNOVIAL CYST;  Surgeon: Jessy Oto, MD;  Location: [facility];  Service: Orthopedics;  Laterality: N/A;  . ORIF FEMUR FRACTURE Left 04/19/2020   Procedure: OPEN REDUCTION INTERNAL FIXATION FEMORAL SHAFT FRACTURE;  Surgeon: Shona Needles, MD;  Location: [facility];  Service: Orthopedics;  Laterality: Left;  . PENILE PROSTHESIS IMPLANT  1990s  . PERIPHERAL VASCULAR CATHETERIZATION N/A 02/14/2016   Procedure: Abdominal Aortogram w/Lower Extremity;  Surgeon: Wellington Hampshire, MD;  Location: [facility];  Service: Cardiovascular;  Laterality: N/A;  . PERIPHERAL VASCULAR CATHETERIZATION Left 02/28/2016   Procedure: Peripheral Vascular Balloon Angioplasty;  Surgeon: Wellington Hampshire, MD;  Location: [facility];  Service: Cardiovascular;  Laterality: Left;  left peroneal and popliteal artery  . [place]  . TOTAL HIP ARTHROPLASTY Left 12/27/2016    Procedure: LEFT TOTAL HIP ARTHROPLASTY ANTERIOR APPROACH;  Surgeon: Mcarthur Rossetti, MD;  Location: [facility];  Service: Orthopedics;  Laterality: Left;  . TRANSURETHRAL RESECTION OF BLADDER TUMOR  10/30/2011   Procedure: TRANSURETHRAL RESECTION OF BLADDER TUMOR (TURBT);  Surgeon: Molli Hazard, MD;  Location: [facility];  Service: Urology;  Laterality: N/A;  . TRANSURETHRAL RESECTION OF BLADDER TUMOR  12/25/2011   Procedure: TRANSURETHRAL RESECTION OF BLADDER TUMOR (TURBT);  Surgeon: Molli Hazard, MD;  Location: [facility];  Service: Urology;  Laterality: N/A;  need long gyrus instruments   . VASECTOMY  1990s     reports that he quit smoking about 51 years ago. His smoking use included cigarettes. He has a 44.00 pack-year smoking history. He has never used smokeless tobacco. He reports that he does not drink alcohol and does not use drugs.  Allergies  Allergen Reactions  . Actos [Pioglitazone Hydrochloride] Other (See Comments)    Held 2012 bladder cancer  . Aspirin Other (See Comments)    Held 2012 due to hematuria and bruising.   Marland Kitchen  Ace Inhibitors Cough  . Bactrim Rash and Other (See Comments)    Rash, presumed allergy  . Gabapentin Nausea And Vomiting    Upset stomach and diarrhea - tolerates low doses  . Lyrica [Pregabalin] Swelling    swelling  . Pravastatin Swelling    Swelling of feet  . Sulfa Drugs Cross Reactors Rash    Family History  Problem Relation Age of Onset  . Diabetes Mother   . Heart disease Brother   . Heart disease Brother   . Heart disease Brother   . Heart disease Brother   . Heart disease Brother   . Heart disease Brother   . Lung cancer Brother      Prior to Admission medications   Medication Sig Start Date End Date Taking? Authorizing Provider  acetaminophen (TYLENOL) 325 MG tablet Take 1-2 tablets (325-650 mg total) by mouth every 4 (four) hours as needed for mild pain. 05/11/20   Angiulli, Lavon Paganini, PA-C  Apremilast 30 MG TABS Take 1 tablet by mouth daily. 03/30/19   Tonia Ghent, MD  atorvastatin (LIPITOR) 10 MG tablet Take 1 tablet (10 mg total) by mouth daily. 05/11/20   Angiulli, Lavon Paganini, PA-C  budesonide-formoterol (SYMBICORT) 160-4.5 MCG/ACT inhaler Inhale 2 puffs into the lungs 2 (two) times daily as needed (shortness of breath). 05/11/20   Angiulli, Lavon Paganini, PA-C  calcium carbonate (OS-CAL - DOSED IN MG OF ELEMENTAL CALCIUM) 1250 (500 Ca) MG tablet Take 1 tablet (500 mg of elemental calcium total) by mouth daily with breakfast. 05/11/20   Angiulli, Lavon Paganini, PA-C  clobetasol (TEMOVATE) 0.05 % external solution Apply 1 application topically daily as needed (rash).     [provider]  docusate sodium (COLACE) 100 MG capsule Take 1 capsule (100 mg total) by mouth 3 (three) times daily. 05/11/20   Angiulli, Lavon Paganini, PA-C  escitalopram (LEXAPRO) 5 MG tablet Take 1 tablet (5 mg total) by mouth daily. 05/11/20   Angiulli, Lavon Paganini, PA-C  HYDROcodone-acetaminophen (NORCO/VICODIN) 5-325 MG tablet Take 1 tablet by mouth every 6 (six) hours as needed for moderate pain. 05/11/20   Angiulli, Lavon Paganini, PA-C  insulin glargine (LANTUS) 100 UNIT/ML Solostar Pen Inject 10 Units into the skin 2 (two) times daily. 05/12/20   Angiulli, Lavon Paganini, PA-C  iron polysaccharides (NIFEREX) 150 MG capsule Take 1 capsule (150 mg total) by mouth daily after supper. 05/11/20   Angiulli, Lavon Paganini, PA-C  melatonin 5 MG TABS Take 1 tablet (5 mg total) by mouth at bedtime as needed (sleep). 05/11/20   Angiulli, Lavon Paganini, PA-C  methocarbamol (ROBAXIN) 500 MG tablet Take 1 tablet (500 mg total) by mouth every 6 (six) hours as needed for muscle spasms. 05/11/20   Angiulli, Lavon Paganini, PA-C  Multiple Vitamin (MULTIVITAMIN WITH MINERALS) TABS tablet Take 1 tablet by mouth daily. 05/11/20   Angiulli, Lavon Paganini, PA-C  polyethylene glycol (MIRALAX / GLYCOLAX) 17 g packet Take 17 g by mouth daily. 05/11/20   Angiulli, Lavon Paganini, PA-C  senna  (SENOKOT) 8.6 MG TABS tablet Take 2 tablets (17.2 mg total) by mouth at bedtime. 05/11/20   Angiulli, Lavon Paganini, PA-C  tamsulosin (FLOMAX) 0.4 MG CAPS capsule Take 2 capsules (0.8 mg total) by mouth daily after supper. 05/11/20   Angiulli, Lavon Paganini, PA-C  tiotropium (SPIRIVA) 18 MCG inhalation capsule Place 1 capsule (18 mcg total) into inhaler and inhale daily. 05/11/20   Angiulli, Lavon Paganini, PA-C  traMADol (ULTRAM) 50 MG tablet Take  1 tablet (50 mg total) by mouth every 6 (six) hours. 05/11/20   Angiulli, Lavon Paganini, PA-C  umeclidinium bromide (INCRUSE ELLIPTA) 62.5 MCG/INH AEPB Inhale 1 puff into the lungs daily. 05/11/20   Cathlyn Parsons, PA-C    Physical Exam: Vitals:   05/14/20 1100 05/14/20 1115 05/14/20 1130 05/14/20 1145  BP: (!) 154/76 (!) 155/82 (!) 157/69 (!) 153/72  Pulse: 78 76 73 73  Resp: 18 16 16 19   Temp:      TempSrc:      SpO2: 95% 99% 98% 96%  Weight:      Height:        Constitutional: NAD, calm, comfortable Vitals:   05/14/20 1100 05/14/20 1115 05/14/20 1130 05/14/20 1145  BP: (!) 154/76 (!) 155/82 (!) 157/69 (!) 153/72  Pulse: 78 76 73 73  Resp: 18 16 16 19   Temp:      TempSrc:      SpO2: 95% 99% 98% 96%  Weight:      Height:       Eyes: PERRL, lids and conjunctivae normal ENMT: Mucous membranes are moist. Posterior pharynx clear of any exudate or lesions.Normal dentition.  Neck: normal, supple, no masses, no thyromegaly Respiratory: clear to auscultation bilaterally, no wheezing, no crackles. Normal respiratory effort. No accessory muscle use.  Cardiovascular: Regular rate and rhythm, no murmurs / rubs / gallops. No extremity edema. 2+ pedal pulses. No carotid bruits.  Abdomen: no tenderness, no masses palpated. No hepatosplenomegaly. Bowel sounds positive.  Musculoskeletal: no clubbing / cyanosis. No joint deformity upper and lower extremities. Good ROM, no contractures. Normal muscle tone.  Skin: no rashes, lesions, ulcers. No induration Neurologic: CN 2-12  grossly intact. Sensation intact, DTR normal. Strength 5/5 in all 4.  Psychiatric: Normal judgment and insight. Alert and oriented x 3. Normal mood.   Labs on Admission: I have personally reviewed following labs and imaging studies  CBC: Recent Labs  Lab 05/08/20 0718 05/14/20 0929  WBC 9.1 10.2  NEUTROABS 5.9 8.6*  HGB 9.9* 11.6*  HCT 33.0* 37.3*  MCV 107.5* 105.1*  PLT 383 762   Basic Metabolic Panel: Recent Labs  Lab 05/08/20 0718 05/14/20 0929  NA 138 135  K 4.5 4.6  CL 99 98  CO2 30 26  GLUCOSE 119* 251*  BUN 29* 27*  CREATININE 1.94* 1.74*  CALCIUM 8.2* 7.9*   GFR: Estimated Creatinine Clearance: 33.5 mL/min (A) (by C-G formula based on SCr of 1.74 mg/dL (H)). Liver Function Tests: Recent Labs  Lab 05/14/20 0929  AST 18  ALT 10  ALKPHOS 86  BILITOT 1.1  PROT 6.9  ALBUMIN 3.4*   Recent Labs  Lab 05/14/20 0929  LIPASE 25   Coagulation Profile: Recent Labs  Lab 05/14/20 0929  INR 1.0   CBG: Recent Labs  Lab 05/11/20 1159 05/11/20 1650 05/11/20 2121 05/12/20 0614 05/12/20 0656  GLUCAP 82 124* 124* 52* 75   Urine analysis:    Component Value Date/Time   COLORURINE YELLOW 04/30/2020 0336   APPEARANCEUR TURBID (A) 04/30/2020 0336   LABSPEC 1.011 04/30/2020 0336   PHURINE 5.0 04/30/2020 0336   GLUCOSEU >=500 (A) 04/30/2020 0336   HGBUR LARGE (A) 04/30/2020 0336   HGBUR trace-intact 11/25/2007 0937   BILIRUBINUR NEGATIVE 04/30/2020 0336   KETONESUR NEGATIVE 04/30/2020 0336   PROTEINUR 100 (A) 04/30/2020 0336   UROBILINOGEN 0.2 01/18/2014 0719   NITRITE POSITIVE (A) 04/30/2020 0336   LEUKOCYTESUR LARGE (A) 04/30/2020 0336    Radiological Exams on  Admission: CT Abdomen Pelvis Wo Contrast  Result Date: 05/14/2020 CLINICAL DATA:  86 year old male with history of abdominal distension. Spitting up blood and cough a grounds for 1 day. Generalized abdominal pain. EXAM: CT ABDOMEN AND PELVIS WITHOUT CONTRAST TECHNIQUE: Multidetector CT imaging  of the abdomen and pelvis was performed following the standard protocol without IV contrast. COMPARISON:  No priors. FINDINGS: Lower chest: Small bilateral pleural effusions lying dependently (left greater than right). Atherosclerotic calcifications in the descending thoracic aorta as well as the left main, left anterior descending, left circumflex and right coronary arteries. Calcifications of the aortic valve. Hepatobiliary: No definite suspicious cystic or solid hepatic lesions are confidently identified on today's noncontrast CT examination. Unenhanced appearance of the gallbladder is unremarkable. Pancreas: No definite pancreatic mass or peripancreatic fluid collections or inflammatory changes are noted on today's noncontrast CT examination. Spleen: Unremarkable. Adrenals/Urinary Tract: Moderate bilateral renal atrophy. Unenhanced appearance of the kidneys and bilateral adrenal glands is otherwise unremarkable. No hydroureteronephrosis. Urinary bladder is unremarkable in appearance. Stomach/Bowel: Unenhanced appearance of the stomach is unremarkable. No pathologic dilatation of small bowel or colon. A few scattered colonic diverticulae are noted, most evident in the sigmoid colon, without surrounding inflammatory changes to suggest an acute diverticulitis at this time. The appendix is not confidently identified and may be surgically absent. Regardless, there are no inflammatory changes noted adjacent to the cecum to suggest the presence of an acute appendicitis at this time. Vascular/Lymphatic: Aortic atherosclerosis. Mildly enlarged left para-aortic lymph node adjacent to the left renal hilum measuring 1.5 cm in short axis (axial image 33 of series 2). No other lymphadenopathy noted in the abdomen or pelvis. Reproductive: Penile implant incidentally noted. Prostate gland and seminal vesicles are poorly evaluated secondary to beam hardening artifact from the patient's left hip arthroplasty. Other: No  significant volume of ascites.  No pneumoperitoneum. Musculoskeletal: The status post left hip arthroplasty. There are no aggressive appearing lytic or blastic lesions noted in the visualized portions of the skeleton. IMPRESSION: 1. No acute findings are noted in the abdomen or pelvis to account for the patient's symptoms. 2. Small bilateral pleural effusions lying dependently. 3. Mildly enlarged left para-aortic retroperitoneal lymph node adjacent to the left renal hilum. This is of uncertain etiology and significance. No other lymphadenopathy is noted elsewhere in the abdomen or pelvis. 4. Moderate bilateral renal atrophy. 5. Colonic diverticulosis without evidence of acute diverticulitis at this time. 6. Aortic atherosclerosis. 7. There are calcifications of the aortic valve. Echocardiographic correlation for evaluation of potential valvular dysfunction may be warranted if clinically indicated. Electronically Signed   By: Vinnie Langton M.D.   On: 05/14/2020 09:56     Assessment/Plan Active Problems:   Type 2 diabetes mellitus with diabetic polyneuropathy (HCC)   Chronic kidney disease, stage III (moderate)   CAD (coronary artery disease) of artery bypass graft   Bladder cancer (HCC)   Atrial fibrillation (HCC)   Chronic diastolic CHF (congestive heart failure) (HCC)   Hyperlipidemia   HTN (hypertension)   Peripheral arterial disease (HCC)   COPD (chronic obstructive pulmonary disease) ([place])   Spinal stenosis of lumbar region with neurogenic claudication   History of hip replacement   Controlled type 2 diabetes mellitus with hyperglycemia, with long-term current use of insulin (HCC)   Femur fracture, left ([place])   Urinary retention with incomplete bladder emptying  GI bleed Wife has pictures of coffee-ground emesis.  She reports black tarry stools.  He was heme occult negative in the ED, but  was mainly admitted for this.  I called [facility] as he has not had any GI care in years, they  called back suggesting that he was a patient of [facility] because his primary care physician is with .  This consult will need to be called in the morning  Recent femur fracture with ORIF status post recent rehab stay Ongoing weakness, PT eval and treat.  The goal is to get him back to weightbearing. Palliative care consult called as well  Coronary artery disease Continue atorvastatin  Type 2 diabetes Continue Lantus  Hypertension On no medications at present  Chronic kidney disease stage IIIb Avoid nephrotoxic agents  Creatinine is at baseline  COPD on home O2 at 2 L Continue Symbicort, Spiriva, Incruse Ellipta, oxygen as needed  Peripheral vascular disease  HFpEF  History of bladder cancer Status post treatment  Pressure ulcer We will get wound care consult  DVT prophylaxis: Lovenox SQ Code Status: Full code Has living will, wife will bring from home, she desires palliative care consult--ordered Family Communication: Wife at bedside  Disposition Plan: to be determined  Consults called: Palliative Admission status: Inpatient   Donnamae Jude MD [facility]  If 7PM-7AM, please contact night-coverage 05/14/2020, 12:40 PM

## 2020-05-14 NOTE — ED Provider Notes (Signed)
[facility] DEPT Provider Note   CSN: [id number] Arrival date & time: 05/14/20  6433     History Chief Complaint  Patient presents with  . Emesis    [name] Small is a 86 y.o. male.  86 year old male presents with emesis since yesterday.  Patient states that he has spit up dark material similar to coffee grounds.  Patient is unsure if he has had black stools.  Denies any abdominal pain at this time.  He has not had any fever or chills.  Patient denies any history of GI bleed.  Patient recently hospitalized for surgery on his left femur and was released yesterday.  EMS was called and patient's blood sugar was 223.  Oxygenation was 89 9% on room air.  He does use oxygen as needed.  He denies any cough or congestion.        Past Medical History:  Diagnosis Date  . (HFpEF) heart failure with preserved ejection fraction ([place])   . Arthritis   . Back pain    s/p lumbar injection 2014  . BENIGN PROSTATIC HYPERTROPHY 05/21/2007  . Bladder cancer ([place]) 10/2011  . CAD (coronary artery disease)    nonobstructive by cath 6/12:  mid LAD 30%, proximal obtuse marginal-2 30%, proximal RCA 20%, mid RCA 30-40%.  He had normal cardiac output and mildly elevated filling pressures but no significant pulmonary hypertension;   Echocardiogram in May 2012 demonstrated EF 50-55% and left atrial enlargement   . Cellulitis of left foot    Hospitalized in 2006  . Chronic kidney disease (CKD), stage III (moderate) 12/04/2007   FOLLOWED BY DR PATEL  . Chronic pain of lower extremity   . Complication of anesthesia    1 time bladder cancer surgery- 2012 , oxygen satruratioon dropped had to stay overnight, no problems since then.  Marland Kitchen COPD (chronic obstructive pulmonary disease) ([place])   . DIABETES MELLITUS, TYPE II 05/21/2007  . DIVERTICULOSIS, COLON 05/21/2007   pt denies  . Dyspnea    with exertion, patient mointors saturation  . Fatigue AGE-RELATED  . Gross hematuria    pt  denies  . HYPERTENSION 05/21/2007  . Impaired hearing BILATERAL HEARING AIDS  . On home oxygen therapy    as needed  . PAD (peripheral artery disease) ([place])    a. 02/2016 L foot nonhealing ulcer-->Periph angio: L pop 100 w/ reconstitution via extensive vollats to the prox-mid peroneal (only patent vessel BK)-->PTA of L Pop and peroneal; b. Ongoing LE ischemia 5 & 06/2016 req amputation of toes on L foot.  . Pneumonia    Hx  of   . Psoriasis ELBOWS  . Psoriasis   . PSORIASIS, SCALP 10/25/2008    Patient Active Problem List   Diagnosis Date Noted  . Urinary retention with incomplete bladder emptying 05/10/2020  . Adjustment reaction of late life   . Femur fracture, left ([place]) 04/26/2020  . Periprosthetic hip fracture 04/26/2020  . Acute kidney injury ([place])   . Closed left subtrochanteric femur fracture, initial encounter ([place])   . Acute blood loss anemia   . Gross hematuria   . Controlled type 2 diabetes mellitus with hyperglycemia, with long-term current use of insulin ([place])   . Benign essential HTN   . Post-operative pain   . Closed left hip fracture, initial encounter ([place]) 04/18/2020  . Anxiety 12/15/2019  . Laceration of right thumb without foreign body with damage to nail 12/15/2019  . Anemia 01/31/2017  . Hypoxia   .  History of hip replacement 12/26/2016    Class: Chronic  . Status post lumbar laminectomy 12/26/2016    Class: Chronic  . S/p left hip fracture 12/26/2016  . Idiopathic chronic venous hypertension of left lower extremity with inflammation 11/07/2016  . Spinal stenosis of lumbar region with neurogenic claudication 10/18/2016  . Spinal stenosis of lumbar region 09/25/2016  . Fall 07/04/2016  . COPD (chronic obstructive pulmonary disease) ([place]) 06/10/2016  . Weakness 03/18/2016  . Critical lower limb ischemia 02/28/2016  . Peripheral arterial disease ([place]) 10/17/2015  . Edema 07/19/2015  . Claudication ([place]) 01/31/2015  . DOE (dyspnea on exertion) 01/31/2015   . Hyperlipidemia 01/31/2015  . HTN (hypertension) 01/31/2015  . Chronic diastolic CHF (congestive heart failure) ([place]) 01/21/2014  . Atrial fibrillation ([place]) 12/16/2012  . Hyperkalemia 11/27/2011  . Bladder cancer ([place]) 09/22/2011  . Leg cramps 08/28/2011  . Cough 05/26/2011  . CAD (coronary artery disease) of artery bypass graft 05/09/2011  . Dyspnea on exertion 04/19/2011  . PSORIASIS, SCALP 10/25/2008  . Chronic kidney disease, stage III (moderate) 12/04/2007  . Type 2 diabetes mellitus with diabetic polyneuropathy ([place]) 05/21/2007  . DIVERTICULOSIS, COLON 05/21/2007  . BPH (benign prostatic hyperplasia) 05/21/2007    Past Surgical History:  Procedure Laterality Date  . AMPUTATION Left 03/27/2016   Procedure: partial first AMPUTATION RAY; LEFT;  Surgeon: Trula Slade, DPM;  Location: [facility];  Service: Podiatry;  Laterality: Left;  . AMPUTATION Left 06/12/2016   Procedure: TRANSMETATARSAL AMPUTATION LEFT FOOT;  Surgeon: Newt Minion, MD;  Location: [facility];  Service: Orthopedics;  Laterality: Left;  . APPENDECTOMY  1941  . CARDIAC CATHETERIZATION  04-24-11/  DR Rogue Jury ARIDA   MILD NONOBSTRUCTIVE CAD, NORMA CARDIAC OUTPUT  . CARDIOVASCULAR STRESS TEST  2007  . CATARACT EXTRACTION W/ INTRAOCULAR LENS  IMPLANT, BILATERAL Bilateral ~ 2010  . CYSTOSCOPY  12/25/2011   Procedure: CYSTOSCOPY;  Surgeon: Molli Hazard, MD;  Location[facility];  Service: Urology;  Laterality: N/A;  needs intubation and to be paralyzed   . CYSTOSCOPY W/ RETROGRADES  10/30/2011   Procedure: CYSTOSCOPY WITH RETROGRADE PYELOGRAM;  Surgeon: Molli Hazard, MD;  Location: [facility];  Service: Urology;  Laterality: Bilateral;  CYSTOSCOPY POSS TURBT BILATERAL RETROGRADE PYLEOGRAM   . CYSTOSCOPY W/ RETROGRADES  11/30/2012   Procedure: CYSTOSCOPY WITH RETROGRADE PYELOGRAM;  Surgeon: Molli Hazard, MD;  Location: [facility];  Service:  Urology;  Laterality: Bilateral;  Flexible cystoscopy.  . CYSTOSCOPY WITH BIOPSY  11/30/2012   Procedure: CYSTOSCOPY WITH BIOPSY;  Surgeon: Molli Hazard, MD;  Location: [facility];  Service: Urology;  Laterality: N/A;  . INCISION AND DRAINAGE FOOT Left ~ 2005   LEFT FOOT DUE TO INFECTION FROM  NAIL PUNCTURE INJURY  . LUMBAR LAMINECTOMY/DECOMPRESSION MICRODISCECTOMY N/A 10/18/2016   Procedure: LEFT AND CENTRAL L4-5 LUMBAR LAMINECTOMY WITH RESECTION OF SYNOVIAL CYST;  Surgeon: Jessy Oto, MD;  Location: [facility];  Service: Orthopedics;  Laterality: N/A;  . ORIF FEMUR FRACTURE Left 04/19/2020   Procedure: OPEN REDUCTION INTERNAL FIXATION FEMORAL SHAFT FRACTURE;  Surgeon: Shona Needles, MD;  Location: [facility];  Service: Orthopedics;  Laterality: Left;  . PENILE PROSTHESIS IMPLANT  1990s  . PERIPHERAL VASCULAR CATHETERIZATION N/A 02/14/2016   Procedure: Abdominal Aortogram w/Lower Extremity;  Surgeon: Wellington Hampshire, MD;  Location: [facility];  Service: Cardiovascular;  Laterality: N/A;  . PERIPHERAL VASCULAR CATHETERIZATION Left 02/28/2016   Procedure: Peripheral  Vascular Balloon Angioplasty;  Surgeon: Wellington Hampshire, MD;  Location: [facility];  Service: Cardiovascular;  Laterality: Left;  left peroneal and popliteal artery  . [place]  . TOTAL HIP ARTHROPLASTY Left 12/27/2016   Procedure: LEFT TOTAL HIP ARTHROPLASTY ANTERIOR APPROACH;  Surgeon: Mcarthur Rossetti, MD;  Location: [facility];  Service: Orthopedics;  Laterality: Left;  . TRANSURETHRAL RESECTION OF BLADDER TUMOR  10/30/2011   Procedure: TRANSURETHRAL RESECTION OF BLADDER TUMOR (TURBT);  Surgeon: Molli Hazard, MD;  Location: [facility];  Service: Urology;  Laterality: N/A;  . TRANSURETHRAL RESECTION OF BLADDER TUMOR  12/25/2011   Procedure: TRANSURETHRAL RESECTION OF BLADDER TUMOR (TURBT);  Surgeon: Molli Hazard, MD;  Location: [facility];  Service: Urology;  Laterality: N/A;  need long gyrus instruments   . VASECTOMY  1990s       Family History  Problem Relation Age of Onset  . Diabetes Mother   . Heart disease Brother   . Heart disease Brother   . Heart disease Brother   . Heart disease Brother   . Heart disease Brother   . Heart disease Brother   . Lung cancer Brother     Social History   Tobacco Use  . Smoking status: Former Smoker    Packs/day: 2.00    Years: 22.00    Pack years: 44.00    Types: Cigarettes    Quit date: 11/11/1968    Years since quitting: 51.5  . Smokeless tobacco: Never Used  Vaping Use  . Vaping Use: Never used  Substance Use Topics  . Alcohol use: No    Comment: occassional- 10/17/16- none  in 4 months- "too sick"  . Drug use: No    Home Medications Prior to Admission medications   Medication Sig Start Date End Date Taking? Authorizing Provider  acetaminophen (TYLENOL) 325 MG tablet Take 1-2 tablets (325-650 mg total) by mouth every 4 (four) hours as needed for mild pain. 05/11/20   Angiulli, Lavon Paganini, PA-C  Apremilast 30 MG TABS Take 1 tablet by mouth daily. 03/30/19   Tonia Ghent, MD  atorvastatin (LIPITOR) 10 MG tablet Take 1 tablet (10 mg total) by mouth daily. 05/11/20   Angiulli, Lavon Paganini, PA-C  budesonide-formoterol (SYMBICORT) 160-4.5 MCG/ACT inhaler Inhale 2 puffs into the lungs 2 (two) times daily as needed (shortness of breath). 05/11/20   Angiulli, Lavon Paganini, PA-C  calcium carbonate (OS-CAL - DOSED IN MG OF ELEMENTAL CALCIUM) 1250 (500 Ca) MG tablet Take 1 tablet (500 mg of elemental calcium total) by mouth daily with breakfast. 05/11/20   Angiulli, Lavon Paganini, PA-C  clobetasol (TEMOVATE) 0.05 % external solution Apply 1 application topically daily as needed (rash).     [provider]  docusate sodium (COLACE) 100 MG capsule Take 1 capsule (100 mg total) by mouth 3 (three) times daily. 05/11/20   Angiulli, Lavon Paganini, PA-C  escitalopram (LEXAPRO) 5 MG  tablet Take 1 tablet (5 mg total) by mouth daily. 05/11/20   Angiulli, Lavon Paganini, PA-C  HYDROcodone-acetaminophen (NORCO/VICODIN) 5-325 MG tablet Take 1 tablet by mouth every 6 (six) hours as needed for moderate pain. 05/11/20   Angiulli, Lavon Paganini, PA-C  insulin glargine (LANTUS) 100 UNIT/ML Solostar Pen Inject 10 Units into the skin 2 (two) times daily. 05/12/20   Angiulli, Lavon Paganini, PA-C  iron polysaccharides (NIFEREX) 150 MG capsule Take 1 capsule (150 mg total) by mouth daily after supper. 05/11/20  Angiulli, Lavon Paganini, PA-C  melatonin 5 MG TABS Take 1 tablet (5 mg total) by mouth at bedtime as needed (sleep). 05/11/20   Angiulli, Lavon Paganini, PA-C  methocarbamol (ROBAXIN) 500 MG tablet Take 1 tablet (500 mg total) by mouth every 6 (six) hours as needed for muscle spasms. 05/11/20   Angiulli, Lavon Paganini, PA-C  Multiple Vitamin (MULTIVITAMIN WITH MINERALS) TABS tablet Take 1 tablet by mouth daily. 05/11/20   Angiulli, Lavon Paganini, PA-C  polyethylene glycol (MIRALAX / GLYCOLAX) 17 g packet Take 17 g by mouth daily. 05/11/20   Angiulli, Lavon Paganini, PA-C  senna (SENOKOT) 8.6 MG TABS tablet Take 2 tablets (17.2 mg total) by mouth at bedtime. 05/11/20   Angiulli, Lavon Paganini, PA-C  tamsulosin (FLOMAX) 0.4 MG CAPS capsule Take 2 capsules (0.8 mg total) by mouth daily after supper. 05/11/20   Angiulli, Lavon Paganini, PA-C  tiotropium (SPIRIVA) 18 MCG inhalation capsule Place 1 capsule (18 mcg total) into inhaler and inhale daily. 05/11/20   Angiulli, Lavon Paganini, PA-C  traMADol (ULTRAM) 50 MG tablet Take 1 tablet (50 mg total) by mouth every 6 (six) hours. 05/11/20   Angiulli, Lavon Paganini, PA-C  umeclidinium bromide (INCRUSE ELLIPTA) 62.5 MCG/INH AEPB Inhale 1 puff into the lungs daily. 05/11/20   Angiulli, Lavon Paganini, PA-C    Allergies    Actos [pioglitazone hydrochloride], Aspirin, Ace inhibitors, Bactrim, Gabapentin, Lyrica [pregabalin], Pravastatin, and Sulfa drugs cross reactors  Review of Systems   Review of Systems  All other systems reviewed  and are negative.   Physical Exam Updated Vital Signs BP 128/83 (BP Location: Left Arm)   Pulse 90   Temp 98.4 F (36.9 C) (Oral)   Resp (!) 23   Ht 1.88 m (6\' 2" )   Wt 92.1 kg   SpO2 (!) 89%   BMI 26.06 kg/m   Physical Exam Vitals and nursing note reviewed.  Constitutional:      General: He is not in acute distress.    Appearance: Normal appearance. He is well-developed. He is not toxic-appearing.  HENT:     Head: Normocephalic and atraumatic.  Eyes:     General: Lids are normal.     Conjunctiva/sclera: Conjunctivae normal.     Pupils: Pupils are equal, round, and reactive to light.  Neck:     Thyroid: No thyroid mass.     Trachea: No tracheal deviation.  Cardiovascular:     Rate and Rhythm: Normal rate and regular rhythm.     Heart sounds: Normal heart sounds. No murmur heard.  No gallop.   Pulmonary:     Effort: Pulmonary effort is normal. No respiratory distress.     Breath sounds: Normal breath sounds. No stridor. No decreased breath sounds, wheezing, rhonchi or rales.  Abdominal:     General: Bowel sounds are normal. There is distension.     Palpations: Abdomen is soft.     Tenderness: There is no abdominal tenderness. There is no guarding or rebound.  Musculoskeletal:        General: No tenderness. Normal range of motion.     Cervical back: Normal range of motion and neck supple.  Skin:    General: Skin is warm and dry.     Findings: No abrasion or rash.  Neurological:     Mental Status: He is alert and oriented to person, place, and time.     GCS: GCS eye subscore is 4. GCS verbal subscore is 5. GCS motor subscore is 6.  Cranial Nerves: No cranial nerve deficit.     Sensory: No sensory deficit.  Psychiatric:        Speech: Speech normal.        Behavior: Behavior normal.     ED Results / Procedures / Treatments   Labs (all labs ordered are listed, but only abnormal results are displayed) Labs Reviewed  CBC WITH DIFFERENTIAL/PLATELET   COMPREHENSIVE METABOLIC PANEL  LIPASE, BLOOD  PROTIME-INR  APTT  TYPE AND SCREEN    EKG None  Radiology No results found.  Procedures Procedures (including critical care time)  Medications Ordered in ED Medications  0.9 %  sodium chloride infusion (has no administration in time range)    ED Course  I have reviewed the triage vital signs and the nursing notes.  Pertinent labs & imaging results that were available during my care of the patient were reviewed by me and considered in my medical decision making (see chart for details).    MDM Rules/Calculators/A&P                          Reviewed patient's stools and clinically they are dark and likely guaiac positive.  Saw a picture from the patient's wife's phone which does show coffee-ground emesis.  Suspect upper GI source of bleeding.  Patient's hemoglobin is stable.  Patient started on Protonix and will admit to the hospital service Final Clinical Impression(s) / ED Diagnoses Final diagnoses:  None    Rx / DC Orders ED Discharge Orders    None       Lacretia Leigh, MD 05/14/20 1128

## 2020-05-14 NOTE — ED Triage Notes (Signed)
[facility] transferred pt to [facility] ED and reports the following:  Pt spitting up blood with coffee ground texture for one day. Generalized abx pain when spitting up. Released from [facility] yesterday for surgery yesterday, had surgery on left femur.  Blood glucose 223. O2 89-90% on room air, wears O2 at home PRN. Lung sounds clear. No abnormal distention. No constipation, diarrhea. No hx GI bleeds. No signs of blood in stool.

## 2020-05-14 NOTE — Progress Notes (Signed)
Attempted to call for report held for 5 minutes.

## 2020-05-14 NOTE — Progress Notes (Signed)
Report Received.

## 2020-05-15 DIAGNOSIS — N183 Chronic kidney disease, stage 3 unspecified: Secondary | ICD-10-CM

## 2020-05-15 DIAGNOSIS — I5032 Chronic diastolic (congestive) heart failure: Secondary | ICD-10-CM

## 2020-05-15 DIAGNOSIS — M48062 Spinal stenosis, lumbar region with neurogenic claudication: Secondary | ICD-10-CM

## 2020-05-15 DIAGNOSIS — I25810 Atherosclerosis of coronary artery bypass graft(s) without angina pectoris: Secondary | ICD-10-CM

## 2020-05-15 DIAGNOSIS — Z794 Long term (current) use of insulin: Secondary | ICD-10-CM

## 2020-05-15 DIAGNOSIS — K921 Melena: Secondary | ICD-10-CM

## 2020-05-15 DIAGNOSIS — I739 Peripheral vascular disease, unspecified: Secondary | ICD-10-CM

## 2020-05-15 DIAGNOSIS — E785 Hyperlipidemia, unspecified: Secondary | ICD-10-CM

## 2020-05-15 DIAGNOSIS — J438 Other emphysema: Secondary | ICD-10-CM

## 2020-05-15 DIAGNOSIS — C679 Malignant neoplasm of bladder, unspecified: Secondary | ICD-10-CM

## 2020-05-15 DIAGNOSIS — R339 Retention of urine, unspecified: Secondary | ICD-10-CM

## 2020-05-15 DIAGNOSIS — Z515 Encounter for palliative care: Secondary | ICD-10-CM

## 2020-05-15 DIAGNOSIS — E1142 Type 2 diabetes mellitus with diabetic polyneuropathy: Secondary | ICD-10-CM

## 2020-05-15 DIAGNOSIS — I4891 Unspecified atrial fibrillation: Secondary | ICD-10-CM

## 2020-05-15 DIAGNOSIS — Z7189 Other specified counseling: Secondary | ICD-10-CM

## 2020-05-15 LAB — CBC
HCT: 34.5 % — ABNORMAL LOW (ref 39.0–52.0)
Hemoglobin: 10.6 g/dL — ABNORMAL LOW (ref 13.0–17.0)
MCH: 32.9 pg (ref 26.0–34.0)
MCHC: 30.7 g/dL (ref 30.0–36.0)
MCV: 107.1 fL — ABNORMAL HIGH (ref 80.0–100.0)
Platelets: 230 10*3/uL (ref 150–400)
RBC: 3.22 MIL/uL — ABNORMAL LOW (ref 4.22–5.81)
RDW: 14.7 % (ref 11.5–15.5)
WBC: 7.3 10*3/uL (ref 4.0–10.5)
nRBC: 0 % (ref 0.0–0.2)

## 2020-05-15 LAB — BASIC METABOLIC PANEL
Anion gap: 8 (ref 5–15)
BUN: 23 mg/dL (ref 8–23)
CO2: 26 mmol/L (ref 22–32)
Calcium: 7.9 mg/dL — ABNORMAL LOW (ref 8.9–10.3)
Chloride: 103 mmol/L (ref 98–111)
Creatinine, Ser: 1.52 mg/dL — ABNORMAL HIGH (ref 0.61–1.24)
GFR calc Af Amer: 46 mL/min — ABNORMAL LOW (ref >60)
GFR calc non Af Amer: 40 mL/min — ABNORMAL LOW (ref >60)
Glucose, Bld: 163 mg/dL — ABNORMAL HIGH (ref 70–99)
Potassium: 4.4 mmol/L (ref 3.5–5.1)
Sodium: 137 mmol/L (ref 135–145)

## 2020-05-15 LAB — GLUCOSE, CAPILLARY
Glucose-Capillary: 151 mg/dL — ABNORMAL HIGH (ref 70–99)
Glucose-Capillary: 77 mg/dL (ref 70–99)
Glucose-Capillary: 74 mg/dL (ref 70–99)
Glucose-Capillary: 113 mg/dL — ABNORMAL HIGH (ref 70–99)

## 2020-05-15 MED ORDER — PANTOPRAZOLE SODIUM 40 MG IV SOLR
40.0000 mg | Freq: Two times a day (BID) | INTRAVENOUS | Status: DC
  Administered 2020-05-15 – 2020-05-16 (×3): 40 mg via INTRAVENOUS
  Filled 2020-05-15 (×3): qty 40

## 2020-05-15 NOTE — Telephone Encounter (Signed)
Noted. Thanks.

## 2020-05-15 NOTE — Progress Notes (Signed)
Pt didn't want to take his MDI or DPI. He stated that that they make him cough and make him feel worse. Rt was able to convinced him to take the dulera but not the incruse. Rt told the family and the Pt to discuss this with his doctor. RT will continue to monitor

## 2020-05-15 NOTE — Consult Note (Signed)
Consultation Note Date: 05/15/2020   Patient Name: [name] Small  DOB: 23-Feb-1928  MRN: [id number]  Age / Sex: 86 y.o., male  PCP: Tonia Ghent, MD Referring Physician: Little Ishikawa, MD  Reason for Consultation: Establishing goals of care  HPI/Patient Profile: 86 y.o. male  admitted on 05/14/2020 with significant  medical history significant of coronary artery disease, COPD on home O2, diabetes type 2, hypertension, chronic kidney disease stage IIIb, HFpEF, peripheral vascular disease, bladder cancer, status post left hip replacement with recent left femoral fracture following a fall in the early part of June.   Patient completed rehabilitation in CIR and was discharged home on Friday.  He developed nausea and had multiple episodes of vomiting for coffee-ground emesis.  Family also noted dark to tarry stools.  Family reports small skin breakdown on sacrum.  Patient is nonambulatory with no weightbearing allowed yet on the left-hand side per the family.  Patient admitted for treatment and stabilization  GI consulted  Patient and family face treatment option decisions, advanced directive decisions and anticipatory care needs.    Clinical Assessment and Goals of Care:  This NP Wadie Lessen reviewed medical records, received report from team, assessed the patient and then meet at the patient's bedside with his wife/ [name] and daughter/ [name] to discuss diagnosis, prognosis, GOC, EOL wishes disposition and options.   Concept of Palliative Care was introduced as specialized medical care for people and their families living with serious illness.  If focuses on providing relief from the symptoms and stress of a serious illness.  The goal is to improve quality of life for both the patient and the family.  Created space and opportunity for patient and family to explore the thoughts and feelings  regarding patient's current medical situation.   All verbalized an understanding of the seriousness of the current medical situation and the various possible outcomes.   All hope for improvement.  Family verbalize concern regarding increased care needs in the home.  Education offered on continued rehab at a facility, in-home community-based palliative services and hospice benefit.    A  discussion was had today regarding advanced directives.  Concepts specific to code status, artifical feeding and hydration, continued IV antibiotics and rehospitalization was had.  The difference between a aggressive medical intervention path  and a palliative comfort care path for this patient at this time was had.  Values and goals of care important to patient and family were attempted to be elicited.   MOST form introduced    Questions and concerns addressed.  Patient  encouraged to call with questions or concerns.     PMT will continue to support holistically.     HCPOA-wife, documents and ACP tab    SUMMARY OF RECOMMENDATIONS    Code Status/Advance Care Planning:  Limited code   Patient is open to all offered and available medical interventions to prolong life.   Symptom Management:   Weakness-continued PT and OT patient is open to ongoing rehabilitation/SNF when stable for  discharge  Palliative Prophylaxis:   Aspiration, Bowel Regimen, Delirium Protocol and Frequent Pain Assessment  Additional Recommendations (Limitations, Scope, Preferences):  Full Scope Treatment  Psycho-social/Spiritual:   Desire for further Chaplaincy support:yes  Additional Recommendations: Education on Hospice  Prognosis:   Unable to determine  Discharge Planning: To Be Determined      Primary Diagnoses: Present on Admission: . Type 2 diabetes mellitus with diabetic polyneuropathy ([place]) . Chronic kidney disease, stage III (moderate) . CAD (coronary artery disease) of artery bypass graft . Bladder  cancer ([place]) . Atrial fibrillation ([place]) . Chronic diastolic CHF (congestive heart failure) ([place]) . Hyperlipidemia . HTN (hypertension) . Peripheral arterial disease ([place]) . COPD (chronic obstructive pulmonary disease) ([place]) . Spinal stenosis of lumbar region with neurogenic claudication . Femur fracture, left ([place]) . Urinary retention with incomplete bladder emptying . GI bleed   I have reviewed the medical record, interviewed the patient and family, and examined the patient. The following aspects are pertinent.  Past Medical History:  Diagnosis Date  . (HFpEF) heart failure with preserved ejection fraction ([place])   . Arthritis   . Back pain    s/p lumbar injection 2014  . BENIGN PROSTATIC HYPERTROPHY 05/21/2007  . Bladder cancer ([place]) 10/2011  . CAD (coronary artery disease)    nonobstructive by cath 6/12:  mid LAD 30%, proximal obtuse marginal-2 30%, proximal RCA 20%, mid RCA 30-40%.  He had normal cardiac output and mildly elevated filling pressures but no significant pulmonary hypertension;   Echocardiogram in May 2012 demonstrated EF 50-55% and left atrial enlargement   . Cellulitis of left foot    Hospitalized in 2006  . Chronic kidney disease (CKD), stage III (moderate) 12/04/2007   FOLLOWED BY DR PATEL  . Chronic pain of lower extremity   . Complication of anesthesia    1 time bladder cancer surgery- 2012 , oxygen satruratioon dropped had to stay overnight, no problems since then.  Marland Kitchen COPD (chronic obstructive pulmonary disease) ([place])   . DIABETES MELLITUS, TYPE II 05/21/2007  . DIVERTICULOSIS, COLON 05/21/2007   pt denies  . Dyspnea    with exertion, patient mointors saturation  . Fatigue AGE-RELATED  . Gross hematuria    pt denies  . HYPERTENSION 05/21/2007  . Impaired hearing BILATERAL HEARING AIDS  . On home oxygen therapy    as needed  . PAD (peripheral artery disease) ([place])    a. 02/2016 L foot nonhealing ulcer-->Periph angio: L pop 100 w/ reconstitution via  extensive vollats to the prox-mid peroneal (only patent vessel BK)-->PTA of L Pop and peroneal; b. Ongoing LE ischemia 5 & 06/2016 req amputation of toes on L foot.  . Pneumonia    Hx  of   . Psoriasis ELBOWS  . Psoriasis   . PSORIASIS, SCALP 10/25/2008   Social History   Socioeconomic History  . Marital status: Married    Spouse name: Not on file  . Number of children: 4  . Years of education: Not on file  . Highest education level: Not on file  Occupational History  . Occupation: retired     Comment: [employer]	[place].  Tobacco Use  . Smoking status: Former Smoker    Packs/day: 2.00    Years: 22.00    Pack years: 44.00    Types: Cigarettes    Quit date: 11/11/1968    Years since quitting: 51.5  . Smokeless tobacco: Never Used  Vaping Use  . Vaping Use: Never used  Substance and Sexual Activity  .  Alcohol use: No    Comment: occassional- 10/17/16- none  in 4 months- "too sick"  . Drug use: No  . Sexual activity: Not Currently  Other Topics Concern  . Not on file  Social History Narrative   Retired Event organiser.   Active with golf.   [place] [phone].     Social Determinants of Health   Financial Resource Strain:   . Difficulty of Paying Living Expenses:   Food Insecurity:   . Worried About Charity fundraiser in the Last Year:   . Arboriculturist in the Last Year:   Transportation Needs:   . Film/video editor (Medical):   Marland Kitchen Lack of Transportation (Non-Medical):   Physical Activity:   . Days of Exercise per Week:   . Minutes of Exercise per Session:   Stress:   . Feeling of Stress :   Social Connections:   . Frequency of Communication with Friends and Family:   . Frequency of Social Gatherings with Friends and Family:   . Attends Religious Services:   . Active Member of Clubs or Organizations:   . Attends Archivist Meetings:   Marland Kitchen Marital Status:    Family History  Problem Relation Age of Onset  . Diabetes Mother   . Heart  disease Brother   . Heart disease Brother   . Heart disease Brother   . Heart disease Brother   . Heart disease Brother   . Heart disease Brother   . Lung cancer Brother    Scheduled Meds: . Apremilast  1 tablet Oral Daily  . atorvastatin  10 mg Oral Daily  . docusate sodium  100 mg Oral TID  . enoxaparin (LOVENOX) injection  30 mg Subcutaneous Q24H  . escitalopram  5 mg Oral Daily  . insulin aspart  0-15 Units Subcutaneous TID WC  . insulin aspart  0-5 Units Subcutaneous QHS  . insulin aspart  4 Units Subcutaneous TID WC  . insulin glargine  5 Units Subcutaneous QHS  . iron polysaccharides  150 mg Oral QPC supper  . mometasone-formoterol  2 puff Inhalation BID  . polyethylene glycol  17 g Oral Daily  . senna  2 tablet Oral QHS  . tamsulosin  0.8 mg Oral QPC supper  . traMADol  50 mg Oral Q6H  . umeclidinium bromide  1 puff Inhalation Daily   Continuous Infusions: . sodium chloride 125 mL/hr at 05/15/20 0923   PRN Meds:.HYDROcodone-acetaminophen, melatonin, methocarbamol, promethazine Medications Prior to Admission:  Prior to Admission medications   Medication Sig Start Date End Date Taking? Authorizing Provider  acetaminophen (TYLENOL) 325 MG tablet Take 1-2 tablets (325-650 mg total) by mouth every 4 (four) hours as needed for mild pain. 05/11/20  Yes Angiulli, Lavon Paganini, PA-C  Apremilast 30 MG TABS Take 1 tablet by mouth daily. 03/30/19  Yes Tonia Ghent, MD  atorvastatin (LIPITOR) 10 MG tablet Take 1 tablet (10 mg total) by mouth daily. 05/11/20  Yes Angiulli, Lavon Paganini, PA-C  budesonide-formoterol (SYMBICORT) 160-4.5 MCG/ACT inhaler Inhale 2 puffs into the lungs 2 (two) times daily as needed (shortness of breath). 05/11/20  Yes Angiulli, Lavon Paganini, PA-C  calcium carbonate (OS-CAL - DOSED IN MG OF ELEMENTAL CALCIUM) 1250 (500 Ca) MG tablet Take 1 tablet (500 mg of elemental calcium total) by mouth daily with breakfast. 05/11/20  Yes Angiulli, Lavon Paganini, PA-C  clobetasol (TEMOVATE)  0.05 % external solution Apply 1 application topically daily as  needed (rash).    Yes [provider]  docusate sodium (COLACE) 100 MG capsule Take 1 capsule (100 mg total) by mouth 3 (three) times daily. 05/11/20  Yes Angiulli, Lavon Paganini, PA-C  escitalopram (LEXAPRO) 5 MG tablet Take 1 tablet (5 mg total) by mouth daily. 05/11/20  Yes Angiulli, Lavon Paganini, PA-C  HYDROcodone-acetaminophen (NORCO/VICODIN) 5-325 MG tablet Take 1 tablet by mouth every 6 (six) hours as needed for moderate pain. 05/11/20  Yes Angiulli, Lavon Paganini, PA-C  insulin glargine (LANTUS) 100 UNIT/ML Solostar Pen Inject 10 Units into the skin 2 (two) times daily. 05/12/20  Yes Angiulli, Lavon Paganini, PA-C  iron polysaccharides (NIFEREX) 150 MG capsule Take 1 capsule (150 mg total) by mouth daily after supper. 05/11/20  Yes Angiulli, Lavon Paganini, PA-C  melatonin 5 MG TABS Take 1 tablet (5 mg total) by mouth at bedtime as needed (sleep). 05/11/20  Yes Angiulli, Lavon Paganini, PA-C  methocarbamol (ROBAXIN) 500 MG tablet Take 1 tablet (500 mg total) by mouth every 6 (six) hours as needed for muscle spasms. 05/11/20  Yes Angiulli, Lavon Paganini, PA-C  Multiple Vitamin (MULTIVITAMIN WITH MINERALS) TABS tablet Take 1 tablet by mouth daily. 05/11/20  Yes Angiulli, Lavon Paganini, PA-C  polyethylene glycol (MIRALAX / GLYCOLAX) 17 g packet Take 17 g by mouth daily. 05/11/20  Yes Angiulli, Lavon Paganini, PA-C  senna (SENOKOT) 8.6 MG TABS tablet Take 2 tablets (17.2 mg total) by mouth at bedtime. 05/11/20  Yes Angiulli, Lavon Paganini, PA-C  tamsulosin (FLOMAX) 0.4 MG CAPS capsule Take 2 capsules (0.8 mg total) by mouth daily after supper. 05/11/20  Yes Angiulli, Lavon Paganini, PA-C  tiotropium (SPIRIVA) 18 MCG inhalation capsule Place 1 capsule (18 mcg total) into inhaler and inhale daily. 05/11/20  Yes Angiulli, Lavon Paganini, PA-C  traMADol (ULTRAM) 50 MG tablet Take 1 tablet (50 mg total) by mouth every 6 (six) hours. 05/11/20  Yes Angiulli, Lavon Paganini, PA-C  umeclidinium bromide (INCRUSE ELLIPTA) 62.5  MCG/INH AEPB Inhale 1 puff into the lungs daily. 05/11/20  Yes Angiulli, Lavon Paganini, PA-C   Allergies  Allergen Reactions  . Actos [Pioglitazone Hydrochloride] Other (See Comments)    Held 2012 bladder cancer  . Aspirin Other (See Comments)    Held 2012 due to hematuria and bruising.   . Ace Inhibitors Cough  . Bactrim Rash and Other (See Comments)    Rash, presumed allergy  . Gabapentin Nausea And Vomiting    Upset stomach and diarrhea - tolerates low doses  . Lyrica [Pregabalin] Swelling    swelling  . Pravastatin Swelling    Swelling of feet  . Sulfa Drugs Cross Reactors Rash   Review of Systems  Constitutional: Positive for appetite change.  Neurological: Positive for weakness.    Physical Exam Cardiovascular:     Rate and Rhythm: Normal rate.  Pulmonary:     Effort: Pulmonary effort is normal.  Musculoskeletal:     Comments: -Generalized weakness  Skin:    General: Skin is warm and dry.     Findings: Bruising present.  Neurological:     Mental Status: He is alert.     Vital Signs: BP (!) 145/63 (BP Location: Left Arm)   Pulse 60   Temp 98.2 F (36.8 C) (Oral)   Resp 19   Ht 6\' 2"  (1.88 m)   Wt 92.1 kg   SpO2 100%   BMI 26.06 kg/m  Pain Scale: 0-10   Pain Score: 0-No pain   SpO2: SpO2: 100 %  O2 Device:SpO2: 100 % O2 Flow Rate: .O2 Flow Rate (L/min): 2 L/min  IO: Intake/output summary:   Intake/Output Summary (Last 24 hours) at 05/15/2020 1014 Last data filed at 05/15/2020 0200 Gross per 24 hour  Intake 889.38 ml  Output 1850 ml  Net -960.62 ml    LBM: Last BM Date: 05/14/20 Baseline Weight: Weight: 92.1 kg Most recent weight: Weight: 92.1 kg     Palliative Assessment/Data: 30 % at best   Discussed with Dr Avon Gully  Time In: 1100 Time Out: 1215 Time Total: 75 minutes Greater than 50%  of this time was spent counseling and coordinating care related to the above assessment and plan.  Signed by: Wadie Lessen, NP   Please contact Palliative  Medicine Team phone at [phone] for questions and concerns.  For individual provider: See Shea Evans

## 2020-05-15 NOTE — Evaluation (Signed)
Physical Therapy Evaluation Patient Details Name: [name] Small MRN: [id number] DOB: [birth year] Today's Date: 05/15/2020   History of Present Illness  86 y.o. male with medical history significant of coronary artery disease, COPD on home O2, diabetes type 2, hypertension, chronic kidney disease stage IIIb, HFpEF, peripheral vascular disease, bladder cancer, status post left hip replacement with recent left femoral fracture following a fall in the early part of June--s/p ORIF L femur ~ 04/20/20. pt went to CIR, home for 1 day and returned to [facility] hospital with GI Bleed  Clinical Impression  Pt admitted with above diagnosis.  Pt agreeable to OOB, very HOH even with hearing aid in place. Pt was able transfer from bed to recliner by lateral scooting technique. Will benefit from PT in acute setting and f/u HHPT  Pt currently with functional limitations due to the deficits listed below (see PT Problem List). Pt will benefit from skilled PT to increase their independence and safety with mobility to allow discharge to the venue listed below.       Follow Up Recommendations Home health PT;Supervision for mobility/OOB    Equipment Recommendations  None recommended by PT    Recommendations for Other Services       Precautions / Restrictions Precautions Precautions: Fall Restrictions Weight Bearing Restrictions: Yes LLE Weight Bearing: Touchdown weight bearing Other Position/Activity Restrictions: Pt has difficulty maintaining TDWB      Mobility  Bed Mobility Overal bed mobility: Needs Assistance Bed Mobility: Supine to Sit Rolling: Min guard         General bed mobility comments: for safety, incr time. pt using his own leg lifter  Transfers Overall transfer level: Needs assistance Equipment used: Rolling walker (2 wheeled);None Transfers: Lateral/Scoot Transfers          Lateral/Scoot Transfers: Min guard;Min assist General transfer comment: light assist/guidance bed to chair,  tactile cues to avoid WBing LLE  Ambulation/Gait             General Gait Details: Deferred d/t pt unable to stand and maintain TDWB  Stairs            Wheelchair Mobility    Modified Rankin (Stroke Patients Only)       Balance Overall balance assessment: Needs assistance Sitting-balance support: Feet supported;No upper extremity supported Sitting balance-Leahy Scale: Good                                       Pertinent Vitals/Pain Pain Assessment: No/denies pain Pain Location: L hip    Home Living Family/patient expects to be discharged to:: Private residence Living Arrangements: Spouse/significant other Available Help at Discharge: Family;Available 24 hours/day Type of Home: House Home Access: Stairs to enter (working on ramp)   Technical brewer of Steps: 2 Home Layout: One level Home Equipment: Shower seat;Hand held shower head;Grab bars - tub/shower;Walker - 4 wheels;Cane - single point;Wheelchair - manual      Prior Function     Gait / Transfers Assistance Needed: non-a, since femur fx in June           Hand Dominance        Extremity/Trunk Assessment   Upper Extremity Assessment Upper Extremity Assessment: Generalized weakness    Lower Extremity Assessment Lower Extremity Assessment: Generalized weakness LLE Deficits / Details: AAROM grossly WFL; strength grossly  2+/5    Cervical / Trunk Assessment Cervical / Trunk Exceptions: Forward head posture  with rounded shoulders  Communication   Communication: HOH  Cognition Arousal/Alertness: Awake/alert Behavior During Therapy: WFL for tasks assessed/performed Overall Cognitive Status: Within Functional Limits for tasks assessed                                 General Comments: Pt following most commands; hearing aid in L ear      General Comments      Exercises General Exercises - Lower Extremity Long Arc Quad: AROM;5 reps;Left    Assessment/Plan    PT Assessment Patient needs continued PT services  PT Problem List Decreased strength;Decreased range of motion;Decreased activity tolerance;Decreased balance;Decreased mobility;Decreased knowledge of use of DME;Decreased safety awareness;Decreased knowledge of precautions       PT Treatment Interventions DME instruction;Gait training;Functional mobility training;Therapeutic activities;Therapeutic exercise;Neuromuscular re-education;Patient/family education    PT Goals (Current goals can be found in the Care Plan section)  Acute Rehab PT Goals Patient Stated Goal: to get stronger and return home PT Goal Formulation: With patient/family Time For Goal Achievement: 05/29/20 Potential to Achieve Goals: Good    Frequency Min 3X/week   Barriers to discharge        Co-evaluation               AM-PAC PT "6 Clicks" Mobility  Outcome Measure Help needed turning from your back to your side while in a flat bed without using bedrails?: A Little Help needed moving from lying on your back to sitting on the side of a flat bed without using bedrails?: A Little Help needed moving to and from a bed to a chair (including a wheelchair)?: A Little Help needed standing up from a chair using your arms (e.g., wheelchair or bedside chair)?: A Lot Help needed to walk in hospital room?: Total Help needed climbing 3-5 steps with a railing? : Total 6 Click Score: 13    End of Session   Activity Tolerance: Patient tolerated treatment well Patient left: in chair;with call bell/phone within reach;with chair alarm set;with family/visitor present   PT Visit Diagnosis: Unsteadiness on feet (R26.81);Difficulty in walking, not elsewhere classified (R26.2);Muscle weakness (generalized) (M62.81)    Time: 1610-9604 PT Time Calculation (min) (ACUTE ONLY): 22 min   Charges:   PT Evaluation $PT Eval Low Complexity: [place], PT  Acute Rehab Dept ([facility])  [phone] Pager [phone]  05/15/2020   [facility] 05/15/2020, 1:42 PM

## 2020-05-15 NOTE — Discharge Summary (Signed)
Physician Discharge Summary  Patient ID: [name] Small MRN: [id number] DOB/AGE: November 13, 1927 86 y.o.  Admit date: 04/26/2020 Discharge date: 05/12/2020  Discharge Diagnoses:  Principal Problem:   Periprosthetic hip fracture Active Problems:   Chronic kidney disease, stage III (moderate)   BPH (benign prostatic hyperplasia)   DOE (dyspnea on exertion)   HTN (hypertension)   COPD (chronic obstructive pulmonary disease) (HCC)   Femur fracture, left (HCC)   Adjustment reaction of late life   Urinary retention with incomplete bladder emptying   Discharged Condition: stable   Significant Diagnostic Studies:  DG ELBOW COMPLETE RIGHT (3+VIEW)  Result Date: 05/02/2020 CLINICAL DATA:  Fall with elbow laceration EXAM: RIGHT ELBOW - COMPLETE 3+ VIEW COMPARISON:  None. FINDINGS: There is no evidence of fracture, dislocation, or joint effusion. There is no evidence of arthropathy or other focal bone abnormality. Soft tissues are unremarkable. IMPRESSION: Negative. Electronically Signed   By: [name] Small M.D.   On: 05/02/2020 15:49    DG HIP UNILAT WITH PELVIS 2-3 VIEWS RIGHT  Result Date: 05/02/2020 CLINICAL DATA:  [place] in therapy, postop right tender hip EXAM: DG HIP (WITH OR WITHOUT PELVIS) 2-3V RIGHT COMPARISON:  04/18/2020 FINDINGS: Left hip replacement. Partially visualized surgical plate and fixating screws in the left femur. Pubic symphysis and rami appear intact. The SI joints are non widened. No acute displaced fracture or malalignment. Moderate arthritis of the right hip. Penile prosthesis. IMPRESSION: Left hip replacement and postsurgical changes. Negative for acute osseous abnormality. Electronically Signed   By: [name] Small M.D.   On: 05/02/2020 15:54   DG FEMUR MIN 2 VIEWS LEFT  Result Date: 05/11/2020 CLINICAL DATA:  Open reduction and internal fixation of left femur fracture. EXAM: LEFT FEMUR 2 VIEWS COMPARISON:  May 02, 2020. FINDINGS: Status post left total hip arthroplasty.  Status post surgical internal fixation of periprostatic fracture. This is unchanged compared to prior exam. No new fracture or dislocation is noted. Vascular calcifications are noted. IMPRESSION: Status post left total hip arthroplasty and surgical internal fixation of periprostatic fracture. No significant change compared to prior exam. Electronically Signed   By: [name] Small M.D.   On: 05/11/2020 11:10   DG FEMUR MIN 2 VIEWS LEFT  Result Date: 05/02/2020 CLINICAL DATA:  Postop EXAM: LEFT FEMUR 2 VIEWS COMPARISON:  04/19/2020 FINDINGS: Previous left hip replacement. Stable surgical plate and multiple screw fixation of left periprosthetic fracture. Proximal cerclage wires. Stable fracture alignment. No definitive osseous bridging. Vascular calcifications. Incompletely visualized penile prosthesis. IMPRESSION: Stable surgical plate and screw fixation of left periprosthetic femur fracture with stable alignment and similar appearance of the hardware. Electronically Signed   By: [name] Small M.D.   On: 05/02/2020 15:52     Labs:  Basic Metabolic Panel: Results for [name] Small (MRN [id number]) as of 05/15/2020 15:02  Ref. Range 05/02/2020 07:19 05/02/2020 07:19 05/04/2020 05:17 05/07/2020 83:41  BASIC METABOLIC PANEL Unknown Rpt (A)  Rpt (A) Rpt (A)  COMPREHENSIVE METABOLIC PANEL Unknown      Sodium Latest Ref Range: 135 - 145 mmol/L 136  135 138  Potassium Latest Ref Range: 3.5 - 5.1 mmol/L 4.5  4.8 4.4  Chloride Latest Ref Range: 98 - 111 mmol/L 99  97 (L) 99  CO2 Latest Ref Range: 22 - 32 mmol/L 28  30 30   Glucose Latest Ref Range: 70 - 99 mg/dL 113 (H)  116 (H) 74  BUN Latest Ref Range: 8 - 23 mg/dL 56 (H)  44 (H)  33 (H)  Creatinine Latest Ref Range: 0.61 - 1.24 mg/dL 2.29 (H)  2.01 (H) 1.90 (H)  Calcium Latest Ref Range: 8.9 - 10.3 mg/dL 8.2 (L)  8.0 (L) 8.2 (L)  Anion gap Latest Ref Range: 5 - 15  9  8 9   Alkaline Phosphatase Latest Ref Range: 38 - 126 U/L      Albumin Latest Ref Range:  3.5 - 5.0 g/dL      Lipase Latest Ref Range: 11 - 51 U/L      AST Latest Ref Range: 15 - 41 U/L      ALT Latest Ref Range: 0 - 44 U/L      Total Protein Latest Ref Range: 6.5 - 8.1 g/dL      Total Bilirubin Latest Ref Range: 0.3 - 1.2 mg/dL      GFR, Est Non African American Latest Ref Range: >60 mL/min 24 (L)  29 (L) 31 (L)  GFR, Est African American Latest Ref Range: >60 mL/min 28 (L)  33 (L) 35 (L)    CBC: Results for [name] Small (MRN [id number]) as of 05/15/2020 15:02  Ref. Range 05/07/2020 05:15 05/08/2020 07:18  WBC Latest Ref Range: 4.0 - 10.5 K/uL 10.6 (H) 9.1  RBC Latest Ref Range: 4.22 - 5.81 MIL/uL 2.86 (L) 3.07 (L)  Hemoglobin Latest Ref Range: 13.0 - 17.0 g/dL 9.5 (L) 9.9 (L)  HCT Latest Ref Range: 39 - 52 % 30.4 (L) 33.0 (L)  MCV Latest Ref Range: 80.0 - 100.0 fL 106.3 (H) 107.5 (H)  MCH Latest Ref Range: 26.0 - 34.0 pg 33.2 32.2  MCHC Latest Ref Range: 30.0 - 36.0 g/dL 31.3 30.0  RDW Latest Ref Range: 11.5 - 15.5 % 15.1 15.3  Platelets Latest Ref Range: 150 - 400 K/uL 406 (H) 383  nRBC Latest Ref Range: 0.0 - 0.2 % 0.0 0.0    Brief HPI:   [name] Small is a 86 y.o. male with history of CAD, T2DM with  Neuropathy, COPD- oxygen prn, CKD who was admitted on 04/18/20 after a fall with onset of severe hip and rib pain.  Patient fell off a curb and landed on his left hip and work-up done revealing oblique periprosthetic proximal femur fracture and moderate to advanced right hip OA.  He was placed in Buck's traction and underwent ORIF left proximal femur on 06/09 by Dr. [name].  Postop to be T DWB on LLE and was started on Lovenox for DVT prophylaxis.  Hospital course has been significant for acute on chronic renal failure with rise in serum creatinine 2.71, urinary retention as well as hematuria requiring CBI as well as lethargy.  Foley was removed on 06/14 and condom cath in place.  He did continue to have worsening of renal status with hyperkalemia.  Therapy has been ongoing and  he was showing improvement in bed mobility.  Pain continues to be a limiting factor.  CIR was recommended due to functional decline   Hospital Course: [name] Small was admitted to rehab 04/26/2020 for inpatient therapies to consist of PT, ST and OT at least three hours five days a week. Past admission physiatrist, therapy team and rehab RN have worked together to provide customized collaborative inpatient rehab. BLE dopplers ordered for work up and was negative for DVT. SCDs were continued for DVT prophylaxis. Hydrocodone was changed to prn due to sedative side effects.  Laxatives were augmented to help manage constipation. His H/H has been monitored with serial checks and have  been be stable.   Voiding was monitored with PVR checks and he was found to have urinary retention requiring I/O cath. He was treated with 250 cc fluid bolus due to continued rise in SCr to 2.63. Dr.G[place]borough was consulted for input on acute on chronic renal failure and felt that urinary retention was likely cause of worsening of renal status.  She added Flomax to help with voiding function and d/c Norvasc to avoid hypotension.  He was also started on weekly ESA  And IV iron due to low iron stores. His Scr was trending down towards his baseline of 2 and nephrology signed off on 06/19.  Due to ongoing retention, flomax was increased to 0.8 mg and urine culture ordered which was revealed Klebsiella Oxytoca.  He was started on IV rocephin due to leucocytosis and completed 7 day course of antibiotics. Wife was educated on I/O cath 4-5 X day to keep bladder decompressed.   Leucocytosis has resolved with treatment of UTI. Tramadol was scheduled to help manage pain with oxycodone on prn basis. His activity tolerance was improving but he continues to require oxygen with activity. He did sustain a fall due to collapse of RLE during transfer attempt. X ray of right elbow and pelvis was negative for fracture. Dr. [name] evaluated fimls He  continues to be limited by TDWB and pain LLE.  Films of left femur was repeated on 07/01 prior to discharge and showed stable hardware and no significant change.   His length of stay was extended to 07/02 to allow for more family members to be available for support at discharge. He will continue to receive follow up HHPT and [place] by [facility] after discharge.  [place] aide services to be arranged by [place].    Rehab course: During patient's stay in rehab weekly team conferences were held to monitor patient's progress, set goals and discuss barriers to discharge. At admission, patient required heavy max assist for transfers and max to total assist with ADL tasks. He  has had improvement in activity tolerance, balance, postural control as well as ability to compensate for deficits. He is able to perform lateral transfers with supervision to [place]. He requires mod to max assist overall for ADL tasks. He requires mod assist for sit to stand transfers and is able to perform lateral scoot tranfers with supervision. He is able to propel his wheelchair for 200' with supervision.  famiy education was completed regarding all aspects of care and safety.    Discharge disposition: 01-Home or Self Care  Diet: Carb Modified.   Special Instructions: 1. Touch down weight on LLE.   Discharge Instructions    Ambulatory referral to Urology   Complete by: As directed    Ambulatory referral for urinary retention with hematuria     Allergies as of 05/12/2020      Reactions   Actos [pioglitazone Hydrochloride] Other (See Comments)   Held 2012 bladder cancer   Aspirin Other (See Comments)   Held 2012 due to hematuria and bruising.    Ace Inhibitors Cough   Bactrim Rash, Other (See Comments)   Rash, presumed allergy   Gabapentin Nausea And Vomiting   Upset stomach and diarrhea - tolerates low doses   Lyrica [pregabalin] Swelling   swelling   Pravastatin Swelling   Swelling of feet   Sulfa Drugs Cross Reactors Rash       Medication List    STOP taking these medications   amLODipine 2.5 MG tablet Commonly known  as: NORVASC   clopidogrel 75 MG tablet Commonly known as: PLAVIX   furosemide 40 MG tablet Commonly known as: LASIX   glipiZIDE 5 MG 24 hr tablet Commonly known as: GLUCOTROL XL   insulin glargine 100 UNIT/ML injection Commonly known as: LANTUS Replaced by: insulin glargine 100 UNIT/ML Solostar Pen   nystatin powder Commonly known as: MYCOSTATIN/NYSTOP     TAKE these medications   acetaminophen 325 MG tablet Commonly known as: TYLENOL Take 1-2 tablets (325-650 mg total) by mouth every 4 (four) hours as needed for mild pain. What changed:   medication strength  how much to take  when to take this  reasons to take this   Apremilast 30 MG Tabs Take 1 tablet by mouth daily.   atorvastatin 10 MG tablet Commonly known as: LIPITOR Take 1 tablet (10 mg total) by mouth daily.   budesonide-formoterol 160-4.5 MCG/ACT inhaler Commonly known as: SYMBICORT Inhale 2 puffs into the lungs 2 (two) times daily as needed (shortness of breath).   calcium carbonate 1250 (500 Ca) MG tablet Commonly known as: OS-CAL - dosed in mg of elemental calcium Take 1 tablet (500 mg of elemental calcium total) by mouth daily with breakfast.   clobetasol 0.05 % external solution Commonly known as: TEMOVATE Apply 1 application topically daily as needed (rash).   docusate sodium 100 MG capsule Commonly known as: COLACE Take 1 capsule (100 mg total) by mouth 3 (three) times daily.   escitalopram 5 MG tablet Commonly known as: Lexapro Take 1 tablet (5 mg total) by mouth daily.   HYDROcodone-acetaminophen 5-325 MG tablet Commonly known as: NORCO/VICODIN Take 1 tablet by mouth every 6 (six) hours as needed for moderate pain. What changed: reasons to take this   insulin glargine 100 UNIT/ML Solostar Pen Commonly known as: LANTUS Inject 10 Units into the skin 2 (two) times daily. Replaces:  insulin glargine 100 UNIT/ML injection   iron polysaccharides 150 MG capsule Commonly known as: NIFEREX Take 1 capsule (150 mg total) by mouth daily after supper.   melatonin 5 MG Tabs Take 1 tablet (5 mg total) by mouth at bedtime as needed (sleep).   methocarbamol 500 MG tablet Commonly known as: ROBAXIN Take 1 tablet (500 mg total) by mouth every 6 (six) hours as needed for muscle spasms.   multivitamin with minerals Tabs tablet Take 1 tablet by mouth daily.   polyethylene glycol 17 g packet Commonly known as: MIRALAX / GLYCOLAX Take 17 g by mouth daily.   senna 8.6 MG Tabs tablet Commonly known as: SENOKOT Take 2 tablets (17.2 mg total) by mouth at bedtime.   tamsulosin 0.4 MG Caps capsule Commonly known as: FLOMAX Take 2 capsules (0.8 mg total) by mouth daily after supper.   tiotropium 18 MCG inhalation capsule Commonly known as: SPIRIVA Place 1 capsule (18 mcg total) into inhaler and inhale daily.   traMADol 50 MG tablet Commonly known as: ULTRAM Take 1 tablet (50 mg total) by mouth every 6 (six) hours.   umeclidinium bromide 62.5 MCG/INH Aepb Commonly known as: INCRUSE ELLIPTA Inhale 1 puff into the lungs daily.       Follow-up Information    [place], Jinny Blossom, MD Follow up.   Specialty: Physical Medicine and Rehabilitation Contact information: [address] [place] [address] [phone]        Shona Needles, MD. Schedule an appointment as soon as possible for a visit in 3 week(s).   Specialty: Orthopedic Surgery Why: repeat x-rays,  likely advancement of weightbearing status Contact information: [place] [address] [phone]        Tonia Ghent, MD. Call.   Specialty: Family Medicine Why: for post hospital follow up Contact information: [place] [address] [phone]        Elmarie Shiley, MD. Call.   Specialty: Nephrology Why: for follow up appointment Contact  information: [place] [address] [phone]               Signed: Bary Leriche 05/15/2020, 3:06 PM

## 2020-05-15 NOTE — Consult Note (Signed)
[facility] Nurse Consult Note: Reason for Consult: Consult requested for buttocks/gluteal fold.  Pt is frequently incontinent of loose tarry stools and it is difficult to keep the affected areas clean and dry.  Bilat buttocks and peri-rectal area is red and macerated; appearance is consistent with moisture associated skin damage. Gluteal fold with partial thickness fissure; approx 2X.2X.1cm, red and moist, related to moisture, NOT pressure. Assessed skin appearance with family at the bedside and discussed plan of care.  Dressing procedure/placement/frequency: Topical treatment orders provided for bedside nurses to perform as follows to protect and promote healing: Apply barrier cream and antifungal powder to rectum/buttocks/gluteal PRN with each turning and cleaning episode. Please re-consult if further assistance is needed.  Thank-you,  Julien Girt MSN, [place], [place], [place], [place]

## 2020-05-15 NOTE — Consult Note (Signed)
Referring Provider:  [facility]         Primary Care Physician:  Tonia Ghent, MD Primary Gastroenterologist:  Elinor Parkinson PCP)        We were asked to see this patient for: GI bleed                 ASSESSMENT /  PLAN    [name] Small is a 86 y.o. male PMH significant for, but not necessarily limited to, hypertension, PAD, DM, CKD 3,  COPD on home 02, CAD, heart failure, psoriasis, bladder cancer, recent left femoral fracture s/p ORIF 04/19/2020.                                                                                                                                    # Stable upper GI bleed with hematemesis. --No major decline in hgb at this point. If presenting hgb of 11.6 is accurate (not falsely elevated from volume depletion) then hgb down one gram overnight. Otherwise would say hgb is same as pre-admission when it was 9.9.  --Stools dark on Fe but heme negative.  --Overall seems like low volume bleed. It does appear he got Lovenox and heparin during recent admission from ORIF. Wife / Daughter think he may have been on plavix in recent past. No NSAID use.  --Rule erosive esophagitis / PUD --Got a one time dose of PPI yesterday, will order scheduled BID IV --Even though no bleeding yesterday nor today he is at risk. Getting Lovenox, please hold and use SCDs  --Will most likely need EGD tomorrow. The risks and benefits of EGD were discussed and the patient agrees to proceed.     # Recent heartburn --Suspect more recumbent than usual during perioperative period ( hip surgery)  --BID PPI  # COPD, uses 02 prn at home  # Multiple medical problems as listed above.    HPI:    Chief Complaint:  Vomiting blood  [name] Small is a 86 y.o. male with multiple medical problems.  Patient had a left femoral fracture following a fall in June.  He was recently discharged from inpatient rehab.  Patient admitted yesterday with nausea and vomiting followed by  coffee-ground emesis.  He has been having dark stools on iron.  Prior to hip surgery early June his hemoglobin was 13.9, postop declined into the low 7 range.  He received a unit of blood on 04/23/2020 with improvement in hemoglobin in the low 8 range.  Patient has been taking oral iron at home.  Hemoglobin yesterday was 11.6, currently at 10.6.  MCV elevated at 107, platelets 230, INR 1, BUN 23, creatinine 1.52, lipase 25, albumin 3.4, liver test otherwise normal    Patient lost appetite 4 to 5 days ago.  He has had mild weight loss.  Complained of heartburn a couple of times recently but otherwise no history of GERD.  On Saturday patient had multiple episodes of small-volume brown emesis.  No vomiting in two days. No associated abdominal pain. No history of PUD.  Patient has not taken aspirin on a regular basis for over a year.  No NSAIDs . There is a question of Plavix use but this sounds like it was not anytime recent.  He did receive Lovenox/heparin in June around time of ORIF.  He received a unit of blood for postop anemia .  Patient started on oral iron and stools have since been black but are heme-negative.  Patient son died of stomach cancer in his fifties  PREVIOUS ENDOSCOPIC EVALUATIONS / GI STUDIES :  None  Past Medical History:  Diagnosis Date  . (HFpEF) heart failure with preserved ejection fraction ([place])   . Arthritis   . Back pain    s/p lumbar injection 2014  . BENIGN PROSTATIC HYPERTROPHY 05/21/2007  . Bladder cancer ([place]) 10/2011  . CAD (coronary artery disease)    nonobstructive by cath 6/12:  mid LAD 30%, proximal obtuse marginal-2 30%, proximal RCA 20%, mid RCA 30-40%.  He had normal cardiac output and mildly elevated filling pressures but no significant pulmonary hypertension;   Echocardiogram in May 2012 demonstrated EF 50-55% and left atrial enlargement   . Cellulitis of left foot    Hospitalized in 2006  . Chronic kidney disease (CKD), stage III (moderate) 12/04/2007    FOLLOWED BY DR PATEL  . Chronic pain of lower extremity   . Complication of anesthesia    1 time bladder cancer surgery- 2012 , oxygen satruratioon dropped had to stay overnight, no problems since then.  Marland Kitchen COPD (chronic obstructive pulmonary disease) ([place])   . DIABETES MELLITUS, TYPE II 05/21/2007  . DIVERTICULOSIS, COLON 05/21/2007   pt denies  . Dyspnea    with exertion, patient mointors saturation  . Fatigue AGE-RELATED  . Gross hematuria    pt denies  . HYPERTENSION 05/21/2007  . Impaired hearing BILATERAL HEARING AIDS  . On home oxygen therapy    as needed  . PAD (peripheral artery disease) ([place])    a. 02/2016 L foot nonhealing ulcer-->Periph angio: L pop 100 w/ reconstitution via extensive vollats to the prox-mid peroneal (only patent vessel BK)-->PTA of L Pop and peroneal; b. Ongoing LE ischemia 5 & 06/2016 req amputation of toes on L foot.  . Pneumonia    Hx  of   . Psoriasis ELBOWS  . Psoriasis   . PSORIASIS, SCALP 10/25/2008    Past Surgical History:  Procedure Laterality Date  . AMPUTATION Left 03/27/2016   Procedure: partial first AMPUTATION RAY; LEFT;  Surgeon: Trula Slade, DPM;  Location: [facility];  Service: Podiatry;  Laterality: Left;  . AMPUTATION Left 06/12/2016   Procedure: TRANSMETATARSAL AMPUTATION LEFT FOOT;  Surgeon: Newt Minion, MD;  Location: [place];  Service: Orthopedics;  Laterality: Left;  . APPENDECTOMY  1941  . CARDIAC CATHETERIZATION  04-24-11/  DR Rogue Jury ARIDA   MILD NONOBSTRUCTIVE CAD, NORMA CARDIAC OUTPUT  . CARDIOVASCULAR STRESS TEST  2007  . CATARACT EXTRACTION W/ INTRAOCULAR LENS  IMPLANT, BILATERAL Bilateral ~ 2010  . CYSTOSCOPY  12/25/2011   Procedure: CYSTOSCOPY;  Surgeon: Molli Hazard, MD;  Location[facility];  Service: Urology;  Laterality: N/A;  needs intubation and to be paralyzed   . CYSTOSCOPY W/ RETROGRADES  10/30/2011   Procedure: CYSTOSCOPY WITH RETROGRADE PYELOGRAM;  Surgeon: Molli Hazard, MD;   Location: [facility];  Service:  Urology;  Laterality: Bilateral;  CYSTOSCOPY POSS TURBT BILATERAL RETROGRADE PYLEOGRAM   . CYSTOSCOPY W/ RETROGRADES  11/30/2012   Procedure: CYSTOSCOPY WITH RETROGRADE PYELOGRAM;  Surgeon: Molli Hazard, MD;  Location: [facility];  Service: Urology;  Laterality: Bilateral;  Flexible cystoscopy.  . CYSTOSCOPY WITH BIOPSY  11/30/2012   Procedure: CYSTOSCOPY WITH BIOPSY;  Surgeon: Molli Hazard, MD;  Location: [facility];  Service: Urology;  Laterality: N/A;  . INCISION AND DRAINAGE FOOT Left ~ 2005   LEFT FOOT DUE TO INFECTION FROM  NAIL PUNCTURE INJURY  . LUMBAR LAMINECTOMY/DECOMPRESSION MICRODISCECTOMY N/A 10/18/2016   Procedure: LEFT AND CENTRAL L4-5 LUMBAR LAMINECTOMY WITH RESECTION OF SYNOVIAL CYST;  Surgeon: Jessy Oto, MD;  Location: [place];  Service: Orthopedics;  Laterality: N/A;  . ORIF FEMUR FRACTURE Left 04/19/2020   Procedure: OPEN REDUCTION INTERNAL FIXATION FEMORAL SHAFT FRACTURE;  Surgeon: Shona Needles, MD;  Location: [place];  Service: Orthopedics;  Laterality: Left;  . PENILE PROSTHESIS IMPLANT  1990s  . PERIPHERAL VASCULAR CATHETERIZATION N/A 02/14/2016   Procedure: Abdominal Aortogram w/Lower Extremity;  Surgeon: Wellington Hampshire, MD;  Location: [facility];  Service: Cardiovascular;  Laterality: N/A;  . PERIPHERAL VASCULAR CATHETERIZATION Left 02/28/2016   Procedure: Peripheral Vascular Balloon Angioplasty;  Surgeon: Wellington Hampshire, MD;  Location: [place] [facility];  Service: Cardiovascular;  Laterality: Left;  left peroneal and popliteal artery  . [place]  . TOTAL HIP ARTHROPLASTY Left 12/27/2016   Procedure: LEFT TOTAL HIP ARTHROPLASTY ANTERIOR APPROACH;  Surgeon: Mcarthur Rossetti, MD;  Location: [facility];  Service: Orthopedics;  Laterality: Left;  . TRANSURETHRAL RESECTION OF BLADDER TUMOR  10/30/2011   Procedure: TRANSURETHRAL RESECTION OF  BLADDER TUMOR (TURBT);  Surgeon: Molli Hazard, MD;  Location: [facility];  Service: Urology;  Laterality: N/A;  . TRANSURETHRAL RESECTION OF BLADDER TUMOR  12/25/2011   Procedure: TRANSURETHRAL RESECTION OF BLADDER TUMOR (TURBT);  Surgeon: Molli Hazard, MD;  Location: [facility];  Service: Urology;  Laterality: N/A;  need long gyrus instruments   . VASECTOMY  1990s    Prior to Admission medications   Medication Sig Start Date End Date Taking? Authorizing Provider  acetaminophen (TYLENOL) 325 MG tablet Take 1-2 tablets (325-650 mg total) by mouth every 4 (four) hours as needed for mild pain. 05/11/20  Yes Angiulli, Lavon Paganini, PA-C  Apremilast 30 MG TABS Take 1 tablet by mouth daily. 03/30/19  Yes Tonia Ghent, MD  atorvastatin (LIPITOR) 10 MG tablet Take 1 tablet (10 mg total) by mouth daily. 05/11/20  Yes Angiulli, Lavon Paganini, PA-C  budesonide-formoterol (SYMBICORT) 160-4.5 MCG/ACT inhaler Inhale 2 puffs into the lungs 2 (two) times daily as needed (shortness of breath). 05/11/20  Yes Angiulli, Lavon Paganini, PA-C  calcium carbonate (OS-CAL - DOSED IN MG OF ELEMENTAL CALCIUM) 1250 (500 Ca) MG tablet Take 1 tablet (500 mg of elemental calcium total) by mouth daily with breakfast. 05/11/20  Yes Angiulli, Lavon Paganini, PA-C  clobetasol (TEMOVATE) 0.05 % external solution Apply 1 application topically daily as needed (rash).    Yes [provider]  docusate sodium (COLACE) 100 MG capsule Take 1 capsule (100 mg total) by mouth 3 (three) times daily. 05/11/20  Yes Angiulli, Lavon Paganini, PA-C  escitalopram (LEXAPRO) 5 MG tablet Take 1 tablet (5 mg total) by mouth daily. 05/11/20  Yes Angiulli, Lavon Paganini, PA-C  HYDROcodone-acetaminophen (NORCO/VICODIN) 5-325 MG tablet Take 1  tablet by mouth every 6 (six) hours as needed for moderate pain. 05/11/20  Yes Angiulli, Lavon Paganini, PA-C  insulin glargine (LANTUS) 100 UNIT/ML Solostar Pen Inject 10 Units into the skin 2 (two) times  daily. 05/12/20  Yes Angiulli, Lavon Paganini, PA-C  iron polysaccharides (NIFEREX) 150 MG capsule Take 1 capsule (150 mg total) by mouth daily after supper. 05/11/20  Yes Angiulli, Lavon Paganini, PA-C  melatonin 5 MG TABS Take 1 tablet (5 mg total) by mouth at bedtime as needed (sleep). 05/11/20  Yes Angiulli, Lavon Paganini, PA-C  methocarbamol (ROBAXIN) 500 MG tablet Take 1 tablet (500 mg total) by mouth every 6 (six) hours as needed for muscle spasms. 05/11/20  Yes Angiulli, Lavon Paganini, PA-C  Multiple Vitamin (MULTIVITAMIN WITH MINERALS) TABS tablet Take 1 tablet by mouth daily. 05/11/20  Yes Angiulli, Lavon Paganini, PA-C  polyethylene glycol (MIRALAX / GLYCOLAX) 17 g packet Take 17 g by mouth daily. 05/11/20  Yes Angiulli, Lavon Paganini, PA-C  senna (SENOKOT) 8.6 MG TABS tablet Take 2 tablets (17.2 mg total) by mouth at bedtime. 05/11/20  Yes Angiulli, Lavon Paganini, PA-C  tamsulosin (FLOMAX) 0.4 MG CAPS capsule Take 2 capsules (0.8 mg total) by mouth daily after supper. 05/11/20  Yes Angiulli, Lavon Paganini, PA-C  tiotropium (SPIRIVA) 18 MCG inhalation capsule Place 1 capsule (18 mcg total) into inhaler and inhale daily. 05/11/20  Yes Angiulli, Lavon Paganini, PA-C  traMADol (ULTRAM) 50 MG tablet Take 1 tablet (50 mg total) by mouth every 6 (six) hours. 05/11/20  Yes Angiulli, Lavon Paganini, PA-C  umeclidinium bromide (INCRUSE ELLIPTA) 62.5 MCG/INH AEPB Inhale 1 puff into the lungs daily. 05/11/20  Yes Angiulli, Lavon Paganini, PA-C    Current Facility-Administered Medications  Medication Dose Route Frequency Provider Last Rate Last Admin  . 0.9 %  sodium chloride infusion   Intravenous Continuous Donnamae Jude, MD 125 mL/hr at 05/14/20 2206 New Bag at 05/14/20 2206  . Apremilast TABS 30 mg  1 tablet Oral Daily Donnamae Jude, MD      . atorvastatin (LIPITOR) tablet 10 mg  10 mg Oral Daily Donnamae Jude, MD   10 mg at 05/15/20 0912  . docusate sodium (COLACE) capsule 100 mg  100 mg Oral TID Donnamae Jude, MD   100 mg at 05/15/20 0912  . enoxaparin (LOVENOX)  injection 30 mg  30 mg Subcutaneous Q24H Donnamae Jude, MD   30 mg at 05/14/20 1747  . escitalopram (LEXAPRO) tablet 5 mg  5 mg Oral Daily Donnamae Jude, MD   5 mg at 05/15/20 0912  . HYDROcodone-acetaminophen (NORCO/VICODIN) 5-325 MG per tablet 1 tablet  1 tablet Oral Q6H PRN Donnamae Jude, MD   1 tablet at 05/14/20 2103  . insulin aspart (novoLOG) injection 0-15 Units  0-15 Units Subcutaneous TID WC Donnamae Jude, MD   3 Units at 05/14/20 1752  . insulin aspart (novoLOG) injection 0-5 Units  0-5 Units Subcutaneous QHS Donnamae Jude, MD      . insulin aspart (novoLOG) injection 4 Units  4 Units Subcutaneous TID WC Donnamae Jude, MD      . insulin glargine (LANTUS) injection 5 Units  5 Units Subcutaneous QHS Donnamae Jude, MD   5 Units at 05/14/20 2103  . iron polysaccharides (NIFEREX) capsule 150 mg  150 mg Oral QPC supper Donnamae Jude, MD   150 mg at 05/14/20 1747  . melatonin tablet 5 mg  5 mg Oral QHS PRN  Donnamae Jude, MD      . methocarbamol (ROBAXIN) tablet 500 mg  500 mg Oral Q6H PRN Donnamae Jude, MD   500 mg at 05/14/20 2103  . mometasone-formoterol (DULERA) 200-5 MCG/ACT inhaler 2 puff  2 puff Inhalation BID Donnamae Jude, MD      . polyethylene glycol (MIRALAX / GLYCOLAX) packet 17 g  17 g Oral Daily Donnamae Jude, MD      . promethazine (PHENERGAN) injection 6.25 mg  6.25 mg Intravenous Q6H PRN Donnamae Jude, MD      . senna (SENOKOT) tablet 17.2 mg  2 tablet Oral QHS Donnamae Jude, MD   17.2 mg at 05/14/20 2103  . tamsulosin (FLOMAX) capsule 0.8 mg  0.8 mg Oral QPC supper Donnamae Jude, MD   0.8 mg at 05/14/20 1747  . traMADol (ULTRAM) tablet 50 mg  50 mg Oral Q6H Donnamae Jude, MD   50 mg at 05/15/20 0525  . umeclidinium bromide (INCRUSE ELLIPTA) 62.5 MCG/INH 1 puff  1 puff Inhalation Daily Donnamae Jude, MD        Allergies as of 05/14/2020 - Review Complete 05/14/2020  Allergen Reaction Noted  . Actos [pioglitazone hydrochloride] Other (See Comments) 09/22/2011   . Aspirin Other (See Comments) 09/22/2011  . Ace inhibitors Cough 08/27/2011  . Bactrim Rash and Other (See Comments) 01/11/2012  . Gabapentin Nausea And Vomiting 04/26/2015  . Lyrica [pregabalin] Swelling 07/18/2015  . Pravastatin Swelling 04/07/2015  . Sulfa drugs cross reactors Rash 02/06/2012    Family History  Problem Relation Age of Onset  . Diabetes Mother   . Heart disease Brother   . Heart disease Brother   . Heart disease Brother   . Heart disease Brother   . Heart disease Brother   . Heart disease Brother   . Lung cancer Brother     Social History   Socioeconomic History  . Marital status: Married    Spouse name: Not on file  . Number of children: 4  . Years of education: Not on file  . Highest education level: Not on file  Occupational History  . Occupation: retired     Comment: [employer]	[place].  Tobacco Use  . Smoking status: Former Smoker    Packs/day: 2.00    Years: 22.00    Pack years: 44.00    Types: Cigarettes    Quit date: 11/11/1968    Years since quitting: 51.5  . Smokeless tobacco: Never Used  Vaping Use  . Vaping Use: Never used  Substance and Sexual Activity  . Alcohol use: No    Comment: occassional- 10/17/16- none  in 4 months- "too sick"  . Drug use: No  . Sexual activity: Not Currently  Other Topics Concern  . Not on file  Social History Narrative   Retired Event organiser.   Active with golf.   [phone].     Social Determinants of Health   Financial Resource Strain:   . Difficulty of Paying Living Expenses:   Food Insecurity:   . Worried About Charity fundraiser in the Last Year:   . Arboriculturist in the Last Year:   Transportation Needs:   . Film/video editor (Medical):   Marland Kitchen Lack of Transportation (Non-Medical):   Physical Activity:   . Days of Exercise per Week:   . Minutes of Exercise per Session:   Stress:   . Feeling of Stress :  Social Connections:   . Frequency of Communication  with Friends and Family:   . Frequency of Social Gatherings with Friends and Family:   . Attends Religious Services:   . Active Member of Clubs or Organizations:   . Attends Archivist Meetings:   Marland Kitchen Marital Status:   Intimate Partner Violence:   . Fear of Current or Ex-Partner:   . Emotionally Abused:   Marland Kitchen Physically Abused:   . Sexually Abused:     Review of Systems: All systems reviewed and negative except where noted in HPI.  Physical Exam: Vital signs in last 24 hours: Temp:  [97.9 F (36.6 C)-98.2 F (36.8 C)] 98.2 F (36.8 C) (07/05 0609) Pulse Rate:  [60-82] 60 (07/05 0609) Resp:  [14-26] 19 (07/05 0609) BP: (135-171)/(55-85) 145/63 (07/05 0609) SpO2:  [91 %-100 %] 100 % (07/05 0609) Last BM Date: 05/14/20 General:   Alert, well-developed,  male in NAD. Daughter and wife in room Psych:  Pleasant, cooperative. Normal mood and affect. Eyes:  Pupils equal, sclera clear, no icterus.   Conjunctiva pink. Ears:  Diminished hearing.  Nose:  No deformity, discharge,  or lesions. Neck:  Supple; no masses Lungs:  Decreased breath sounds bilaterally. No wheezes, crackles, or rhonchi.  Heart:  Regular rate and rhythm; no murmurs, no lower extremity edema Abdomen:  Soft, non-distended, nontender, BS active, no palp mass   Rectal:  Deferred  Msk:  Symmetrical without gross deformities. . Neurologic:  Alert and  oriented x4;  grossly normal neurologically.   Intake/Output from previous day: 07/04 0701 - 07/05 0700 In: 889.4 [P.O.:328; I.V.:561.4] Out: 1850 [Urine:1850] Intake/Output this shift: No intake/output data recorded.  Lab Results: Recent Labs    05/14/20 0929 05/14/20 1623 05/15/20 0552  WBC 10.2 9.9 7.3  HGB 11.6* 10.9* 10.6*  HCT 37.3* 35.1* 34.5*  PLT 255 286 230   BMET Recent Labs    05/14/20 0929 05/15/20 0552  NA 135 137  K 4.6 4.4  CL 98 103  CO2 26 26  GLUCOSE 251* 163*  BUN 27* 23  CREATININE 1.74* 1.52*  CALCIUM 7.9* 7.9*    LFT Recent Labs    05/14/20 0929  PROT 6.9  ALBUMIN 3.4*  AST 18  ALT 10  ALKPHOS 86  BILITOT 1.1   PT/INR Recent Labs    05/14/20 0929  LABPROT 12.9  INR 1.0   Hepatitis Panel No results for input(s): HEPBSAG, HCVAB, HEPAIGM, HEPBIGM in the last 72 hours.   . CBC Latest Ref Rng & Units 05/15/2020 05/14/2020 05/14/2020  WBC 4.0 - 10.5 K/uL 7.3 9.9 10.2  Hemoglobin 13.0 - 17.0 g/dL 10.6(L) 10.9(L) 11.6(L)  Hematocrit 39 - 52 % 34.5(L) 35.1(L) 37.3(L)  Platelets 150 - 400 K/uL 230 286 255    . CMP Latest Ref Rng & Units 05/15/2020 05/14/2020 05/08/2020  Glucose 70 - 99 mg/dL 163(H) 251(H) 119(H)  BUN 8 - 23 mg/dL 23 27(H) 29(H)  Creatinine 0.61 - 1.24 mg/dL 1.52(H) 1.74(H) 1.94(H)  Sodium 135 - 145 mmol/L 137 135 138  Potassium 3.5 - 5.1 mmol/L 4.4 4.6 4.5  Chloride 98 - 111 mmol/L 103 98 99  CO2 22 - 32 mmol/L 26 26 30   Calcium 8.9 - 10.3 mg/dL 7.9(L) 7.9(L) 8.2(L)  Total Protein 6.5 - 8.1 g/dL - 6.9 -  Total Bilirubin 0.3 - 1.2 mg/dL - 1.1 -  Alkaline Phos 38 - 126 U/L - 86 -  AST 15 - 41 U/L - 18 -  ALT 0 - 44 U/L - 10 -   Studies/Results: CT Abdomen Pelvis Wo Contrast  Result Date: 05/14/2020 CLINICAL DATA:  86 year old male with history of abdominal distension. Spitting up blood and cough a grounds for 1 day. Generalized abdominal pain. EXAM: CT ABDOMEN AND PELVIS WITHOUT CONTRAST TECHNIQUE: Multidetector CT imaging of the abdomen and pelvis was performed following the standard protocol without IV contrast. COMPARISON:  No priors. FINDINGS: Lower chest: Small bilateral pleural effusions lying dependently (left greater than right). Atherosclerotic calcifications in the descending thoracic aorta as well as the left main, left anterior descending, left circumflex and right coronary arteries. Calcifications of the aortic valve. Hepatobiliary: No definite suspicious cystic or solid hepatic lesions are confidently identified on today's noncontrast CT examination. Unenhanced  appearance of the gallbladder is unremarkable. Pancreas: No definite pancreatic mass or peripancreatic fluid collections or inflammatory changes are noted on today's noncontrast CT examination. Spleen: Unremarkable. Adrenals/Urinary Tract: Moderate bilateral renal atrophy. Unenhanced appearance of the kidneys and bilateral adrenal glands is otherwise unremarkable. No hydroureteronephrosis. Urinary bladder is unremarkable in appearance. Stomach/Bowel: Unenhanced appearance of the stomach is unremarkable. No pathologic dilatation of small bowel or colon. A few scattered colonic diverticulae are noted, most evident in the sigmoid colon, without surrounding inflammatory changes to suggest an acute diverticulitis at this time. The appendix is not confidently identified and may be surgically absent. Regardless, there are no inflammatory changes noted adjacent to the cecum to suggest the presence of an acute appendicitis at this time. Vascular/Lymphatic: Aortic atherosclerosis. Mildly enlarged left para-aortic lymph node adjacent to the left renal hilum measuring 1.5 cm in short axis (axial image 33 of series 2). No other lymphadenopathy noted in the abdomen or pelvis. Reproductive: Penile implant incidentally noted. Prostate gland and seminal vesicles are poorly evaluated secondary to beam hardening artifact from the patient's left hip arthroplasty. Other: No significant volume of ascites.  No pneumoperitoneum. Musculoskeletal: The status post left hip arthroplasty. There are no aggressive appearing lytic or blastic lesions noted in the visualized portions of the skeleton. IMPRESSION: 1. No acute findings are noted in the abdomen or pelvis to account for the patient's symptoms. 2. Small bilateral pleural effusions lying dependently. 3. Mildly enlarged left para-aortic retroperitoneal lymph node adjacent to the left renal hilum. This is of uncertain etiology and significance. No other lymphadenopathy is noted elsewhere in  the abdomen or pelvis. 4. Moderate bilateral renal atrophy. 5. Colonic diverticulosis without evidence of acute diverticulitis at this time. 6. Aortic atherosclerosis. 7. There are calcifications of the aortic valve. Echocardiographic correlation for evaluation of potential valvular dysfunction may be warranted if clinically indicated. Electronically Signed   By: Vinnie Langton M.D.   On: 05/14/2020 09:56    Active Problems:   Type 2 diabetes mellitus with diabetic polyneuropathy (HCC)   Chronic kidney disease, stage III (moderate)   CAD (coronary artery disease) of artery bypass graft   Bladder cancer (HCC)   Atrial fibrillation (HCC)   Chronic diastolic CHF (congestive heart failure) (HCC)   Hyperlipidemia   HTN (hypertension)   Peripheral arterial disease (HCC)   COPD (chronic obstructive pulmonary disease) ([place])   Spinal stenosis of lumbar region with neurogenic claudication   History of hip replacement   Controlled type 2 diabetes mellitus with hyperglycemia, with long-term current use of insulin ([place])   Femur fracture, left ([place])   Urinary retention with incomplete bladder emptying   GI bleed    Tye Savoy, NP-C @  05/15/2020, 9:23 AM

## 2020-05-15 NOTE — Progress Notes (Signed)
PROGRESS NOTE    [name]  VFI:[id number] DOB: 07-21-1928 DOA: 05/14/2020 PCP: Tonia Ghent, MD   Brief Narrative:  [name] Small is a 86 y.o. male with medical history significant of coronary artery disease, COPD on home O2, diabetes type 2, hypertension, chronic kidney disease stage IIIb, HFpEF, peripheral vascular disease, bladder cancer, status post left hip replacement with recent left femoral fracture following a fall in the early part of June.  He has been in inpatient rehab from [id number] and was just sent home.  He was doing well at home for approximately 1 day when he began to develop nausea all day yesterday.  He then began to get sick and had coffee-ground emesis for multiple episodes yesterday.  Having regular soft dark black stools.  His wife reports 2 wounds on his backside.  He has been wheelchair-bound or bedbound since his surgery and is still having difficulty bearing weight on his left side.  She reports has had poor appetite but no abdominal pain.  He is currently incontinent of both stool and urine at home.  She is working to get the house set up with a wheelchair ramp. In the ED he was noted to have hemoglobin of 11.6 and abnormal blood sugar of 251 and normal INR at 1.0.  Assessment & Plan:   Active Problems:   Type 2 diabetes mellitus with diabetic polyneuropathy (HCC)   Chronic kidney disease, stage III (moderate)   CAD (coronary artery disease) of artery bypass graft   Bladder cancer (HCC)   Atrial fibrillation (HCC)   Chronic diastolic CHF (congestive heart failure) (HCC)   Hyperlipidemia   HTN (hypertension)   Peripheral arterial disease (HCC)   COPD (chronic obstructive pulmonary disease) ([place])   Spinal stenosis of lumbar region with neurogenic claudication   History of hip replacement   Controlled type 2 diabetes mellitus with hyperglycemia, with long-term current use of insulin (HCC)   Femur fracture, left ([place])   Urinary retention with incomplete  bladder emptying   GI bleed   Questionable acute symptomatic with likely upper GI bleed, POA Wife notes pictures of coffee-ground emesis and black tarry stools for days-week.   [facility] following, appreciate insight recommendations, tentative plan for upper endoscopy on 05/16/2020  Recent femur fracture with ORIF status post recent rehab stay Ongoing weakness, PT eval and treat.  The goal is to get him back to weightbearing. Palliative care consult called as well  Coronary artery disease Continue atorvastatin  Type 2 diabetes Continue Lantus  Hypertension On no medications at present  Chronic kidney disease stage IIIb Avoid nephrotoxic agents  Creatinine is at baseline  COPD on home O2 at 2 L Continue Symbicort, Spiriva, Incruse Ellipta, oxygen as needed  Peripheral vascular disease  HFpEF  History of bladder cancer Status post treatment  Pressure ulcer We will get wound care consult  DVT prophylaxis: Lovenox SQ Code Status: Full code Has living will, wife will bring from home, she desires palliative care consult--ordered Family Communication: Wife at bedside  Status is: Inpatient  Dispo: The patient is from: Home              Anticipated d/c is to: Home              Anticipated d/c date is: 24 to 48 hours              Patient currently not medically stable for discharge due to need for ongoing work-up and evaluation with  GI, endoscopy and repeat H&H.  Consultants:   GI, [facility]  Procedures:   Upper endoscopy planned 05/16/2020  Antimicrobials:  None indicated  Subjective: No acute issues or events overnight, denies nausea, vomiting, diarrhea, constipation, headache, fevers, chills.  Objective: Vitals:   05/14/20 1831 05/14/20 2048 05/15/20 0230 05/15/20 0609  BP: 139/66 135/66 (!) 135/55 (!) 145/63  Pulse: 69 66 67 60  Resp: 16 20 18 19   Temp: 98.1 F (36.7 C) 98.1 F (36.7 C) 98.2 F (36.8 C) 98.2 F (36.8 C)  TempSrc: Oral Oral  Oral Oral  SpO2: 98% 100% 98% 100%  Weight:      Height:        Intake/Output Summary (Last 24 hours) at 05/15/2020 0728 Last data filed at 05/15/2020 0200 Gross per 24 hour  Intake 889.38 ml  Output 1850 ml  Net -960.62 ml   Filed Weights   05/14/20 0835  Weight: 92.1 kg    Examination:  General:  Pleasantly resting in bed, No acute distress. HEENT:  Normocephalic atraumatic.  Sclerae nonicteric, noninjected.  Extraocular movements intact bilaterally. Neck:  Without mass or deformity.  Trachea is midline. Lungs:  Clear to auscultate bilaterally without rhonchi, wheeze, or rales. Heart:  Regular rate and rhythm.  Without murmurs, rubs, or gallops. Abdomen:  Soft, nontender, nondistended.  Without guarding or rebound. Extremities: Without cyanosis, clubbing, edema, or obvious deformity. Vascular:  Dorsalis pedis and posterior tibial pulses palpable bilaterally. Skin:  Warm and dry, no erythema, no ulcerations.    Data Reviewed: I have personally reviewed following labs and imaging studies  CBC: Recent Labs  Lab 05/14/20 0929 05/14/20 1623 05/15/20 0552  WBC 10.2 9.9 7.3  NEUTROABS 8.6*  --   --   HGB 11.6* 10.9* 10.6*  HCT 37.3* 35.1* 34.5*  MCV 105.1* 105.7* 107.1*  PLT 255 286 440   Basic Metabolic Panel: Recent Labs  Lab 05/14/20 0929 05/15/20 0552  NA 135 137  K 4.6 4.4  CL 98 103  CO2 26 26  GLUCOSE 251* 163*  BUN 27* 23  CREATININE 1.74* 1.52*  CALCIUM 7.9* 7.9*   GFR: Estimated Creatinine Clearance: 38.3 mL/min (A) (by C-G formula based on SCr of 1.52 mg/dL (H)). Liver Function Tests: Recent Labs  Lab 05/14/20 0929  AST 18  ALT 10  ALKPHOS 86  BILITOT 1.1  PROT 6.9  ALBUMIN 3.4*   Recent Labs  Lab 05/14/20 0929  LIPASE 25   No results for input(s): AMMONIA in the last 168 hours. Coagulation Profile: Recent Labs  Lab 05/14/20 0929  INR 1.0   Cardiac Enzymes: No results for input(s): CKTOTAL, CKMB, CKMBINDEX, TROPONINI in the last  168 hours. BNP (last 3 results) No results for input(s): PROBNP in the last 8760 hours. HbA1C: No results for input(s): HGBA1C in the last 72 hours. CBG: Recent Labs  Lab 05/11/20 2121 05/12/20 0614 05/12/20 0656 05/14/20 1646 05/14/20 2209  GLUCAP 124* 52* 75 165* 159*   Lipid Profile: No results for input(s): CHOL, HDL, LDLCALC, TRIG, CHOLHDL, LDLDIRECT in the last 72 hours. Thyroid Function Tests: Recent Labs    05/14/20 1623  TSH 1.448   Anemia Panel: No results for input(s): VITAMINB12, FOLATE, FERRITIN, TIBC, IRON, RETICCTPCT in the last 72 hours. Sepsis Labs: No results for input(s): PROCALCITON, LATICACIDVEN in the last 168 hours.  Recent Results (from the past 240 hour(s))  SARS Coronavirus 2 by RT PCR (hospital order, performed in [facility] hospital lab) Nasopharyngeal Nasopharyngeal Swab  Status: None   Collection Time: 05/14/20 11:29 AM   Specimen: Nasopharyngeal Swab  Result Value Ref Range Status   SARS Coronavirus 2 NEGATIVE NEGATIVE Final    Comment: (NOTE) SARS-CoV-2 target nucleic acids are NOT DETECTED.  The SARS-CoV-2 RNA is generally detectable in upper and lower respiratory specimens during the acute phase of infection. The lowest concentration of SARS-CoV-2 viral copies this assay can detect is 250 copies / mL. A negative result does not preclude SARS-CoV-2 infection and should not be used as the sole basis for treatment or other patient management decisions.  A negative result may occur with improper specimen collection / handling, submission of specimen other than nasopharyngeal swab, presence of viral mutation(s) within the areas targeted by this assay, and inadequate number of viral copies (<250 copies / mL). A negative result must be combined with clinical observations, patient history, and epidemiological information.  Fact Sheet for Patients:   StrictlyIdeas.no  Fact Sheet for Healthcare  Providers: BankingDealers.co.za  This test is not yet approved or  cleared by the [place] FDA and has been authorized for detection and/or diagnosis of SARS-CoV-2 by FDA under an Emergency Use Authorization (EUA).  This EUA will remain in effect (meaning this test can be used) for the duration of the COVID-19 declaration under Section 564(b)(1) of the Act, 21 U.S.C. section 360bbb-3(b)(1), unless the authorization is terminated or revoked sooner.  Performed at [facility], [place] [address] [place], [place] [address]          Radiology Studies: CT Abdomen Pelvis Wo Contrast  Result Date: 05/14/2020 CLINICAL DATA:  86 year old male with history of abdominal distension. Spitting up blood and cough a grounds for 1 day. Generalized abdominal pain. EXAM: CT ABDOMEN AND PELVIS WITHOUT CONTRAST TECHNIQUE: Multidetector CT imaging of the abdomen and pelvis was performed following the standard protocol without IV contrast. COMPARISON:  No priors. FINDINGS: Lower chest: Small bilateral pleural effusions lying dependently (left greater than right). Atherosclerotic calcifications in the descending thoracic aorta as well as the left main, left anterior descending, left circumflex and right coronary arteries. Calcifications of the aortic valve. Hepatobiliary: No definite suspicious cystic or solid hepatic lesions are confidently identified on today's noncontrast CT examination. Unenhanced appearance of the gallbladder is unremarkable. Pancreas: No definite pancreatic mass or peripancreatic fluid collections or inflammatory changes are noted on today's noncontrast CT examination. Spleen: Unremarkable. Adrenals/Urinary Tract: Moderate bilateral renal atrophy. Unenhanced appearance of the kidneys and bilateral adrenal glands is otherwise unremarkable. No hydroureteronephrosis. Urinary bladder is unremarkable in appearance. Stomach/Bowel: Unenhanced appearance of the  stomach is unremarkable. No pathologic dilatation of small bowel or colon. A few scattered colonic diverticulae are noted, most evident in the sigmoid colon, without surrounding inflammatory changes to suggest an acute diverticulitis at this time. The appendix is not confidently identified and may be surgically absent. Regardless, there are no inflammatory changes noted adjacent to the cecum to suggest the presence of an acute appendicitis at this time. Vascular/Lymphatic: Aortic atherosclerosis. Mildly enlarged left para-aortic lymph node adjacent to the left renal hilum measuring 1.5 cm in short axis (axial image 33 of series 2). No other lymphadenopathy noted in the abdomen or pelvis. Reproductive: Penile implant incidentally noted. Prostate gland and seminal vesicles are poorly evaluated secondary to beam hardening artifact from the patient's left hip arthroplasty. Other: No significant volume of ascites.  No pneumoperitoneum. Musculoskeletal: The status post left hip arthroplasty. There are no aggressive appearing lytic or blastic lesions noted in the  visualized portions of the skeleton. IMPRESSION: 1. No acute findings are noted in the abdomen or pelvis to account for the patient's symptoms. 2. Small bilateral pleural effusions lying dependently. 3. Mildly enlarged left para-aortic retroperitoneal lymph node adjacent to the left renal hilum. This is of uncertain etiology and significance. No other lymphadenopathy is noted elsewhere in the abdomen or pelvis. 4. Moderate bilateral renal atrophy. 5. Colonic diverticulosis without evidence of acute diverticulitis at this time. 6. Aortic atherosclerosis. 7. There are calcifications of the aortic valve. Echocardiographic correlation for evaluation of potential valvular dysfunction may be warranted if clinically indicated. Electronically Signed   By: Vinnie Langton M.D.   On: 05/14/2020 09:56        Scheduled Meds: . Apremilast  1 tablet Oral Daily  .  atorvastatin  10 mg Oral Daily  . docusate sodium  100 mg Oral TID  . enoxaparin (LOVENOX) injection  30 mg Subcutaneous Q24H  . escitalopram  5 mg Oral Daily  . insulin aspart  0-15 Units Subcutaneous TID WC  . insulin aspart  0-5 Units Subcutaneous QHS  . insulin aspart  4 Units Subcutaneous TID WC  . insulin glargine  5 Units Subcutaneous QHS  . iron polysaccharides  150 mg Oral QPC supper  . mometasone-formoterol  2 puff Inhalation BID  . pantoprazole  40 mg Intravenous Once  . polyethylene glycol  17 g Oral Daily  . senna  2 tablet Oral QHS  . tamsulosin  0.8 mg Oral QPC supper  . traMADol  50 mg Oral Q6H  . umeclidinium bromide  1 puff Inhalation Daily   Continuous Infusions: . sodium chloride 125 mL/hr at 05/14/20 2206     LOS: 1 day   Time spent: 10 min  Little Ishikawa, DO [facility]  If 7PM-7AM, please contact night-coverage [email or web address]  05/15/2020, 7:28 AM

## 2020-05-16 ENCOUNTER — Encounter: Payer: Self-pay | Admitting: Family Medicine

## 2020-05-16 ENCOUNTER — Inpatient Hospital Stay (HOSPITAL_COMMUNITY): Payer: Medicare Other | Admitting: Anesthesiology

## 2020-05-16 ENCOUNTER — Telehealth: Payer: Self-pay

## 2020-05-16 ENCOUNTER — Encounter (HOSPITAL_COMMUNITY)
Admission: EM | Disposition: A | Discharge status: Home Health | Payer: Self-pay | Source: Home / Self Care | Attending: Internal Medicine

## 2020-05-16 ENCOUNTER — Other Ambulatory Visit: Payer: Self-pay

## 2020-05-16 DIAGNOSIS — K920 Hematemesis: Principal | ICD-10-CM

## 2020-05-16 DIAGNOSIS — K209 Esophagitis, unspecified without bleeding: Secondary | ICD-10-CM

## 2020-05-16 DIAGNOSIS — Z515 Encounter for palliative care: Secondary | ICD-10-CM

## 2020-05-16 DIAGNOSIS — Z7189 Other specified counseling: Secondary | ICD-10-CM

## 2020-05-16 DIAGNOSIS — K2970 Gastritis, unspecified, without bleeding: Secondary | ICD-10-CM

## 2020-05-16 DIAGNOSIS — K2990 Gastroduodenitis, unspecified, without bleeding: Secondary | ICD-10-CM

## 2020-05-16 DIAGNOSIS — S728X2D Other fracture of left femur, subsequent encounter for closed fracture with routine healing: Secondary | ICD-10-CM

## 2020-05-16 DIAGNOSIS — K2100 Gastro-esophageal reflux disease with esophagitis, without bleeding: Secondary | ICD-10-CM

## 2020-05-16 DIAGNOSIS — R531 Weakness: Secondary | ICD-10-CM

## 2020-05-16 DIAGNOSIS — K2210 Ulcer of esophagus without bleeding: Secondary | ICD-10-CM

## 2020-05-16 HISTORY — PX: BIOPSY: SHX5522

## 2020-05-16 HISTORY — PX: ESOPHAGOGASTRODUODENOSCOPY (EGD) WITH PROPOFOL: SHX5813

## 2020-05-16 LAB — CBC
HCT: 32.6 % — ABNORMAL LOW (ref 39.0–52.0)
Hemoglobin: 10.1 g/dL — ABNORMAL LOW (ref 13.0–17.0)
MCH: 33 pg (ref 26.0–34.0)
MCHC: 31 g/dL (ref 30.0–36.0)
MCV: 106.5 fL — ABNORMAL HIGH (ref 80.0–100.0)
Platelets: 203 10*3/uL (ref 150–400)
RBC: 3.06 MIL/uL — ABNORMAL LOW (ref 4.22–5.81)
RDW: 14.6 % (ref 11.5–15.5)
WBC: 7.1 10*3/uL (ref 4.0–10.5)
nRBC: 0 % (ref 0.0–0.2)

## 2020-05-16 LAB — GLUCOSE, CAPILLARY
Glucose-Capillary: 74 mg/dL (ref 70–99)
Glucose-Capillary: 70 mg/dL (ref 70–99)
Glucose-Capillary: 79 mg/dL (ref 70–99)

## 2020-05-16 SURGERY — ESOPHAGOGASTRODUODENOSCOPY (EGD) WITH PROPOFOL
Anesthesia: Monitor Anesthesia Care

## 2020-05-16 MED ORDER — PROPOFOL 500 MG/50ML IV EMUL
INTRAVENOUS | Status: AC
  Filled 2020-05-16: qty 50

## 2020-05-16 MED ORDER — PROPOFOL 10 MG/ML IV BOLUS
INTRAVENOUS | Status: DC | PRN
  Administered 2020-05-16: 40 mg via INTRAVENOUS

## 2020-05-16 MED ORDER — LACTATED RINGERS IV SOLN: INTRAVENOUS | Status: DC | PRN

## 2020-05-16 MED ORDER — OMEPRAZOLE 40 MG PO CPDR: 40.0000 mg | DELAYED_RELEASE_CAPSULE | Freq: Every day | ORAL | 0 refills | Status: DC

## 2020-05-16 MED ORDER — PROPOFOL 500 MG/50ML IV EMUL
INTRAVENOUS | Status: DC | PRN
  Administered 2020-05-16: 100 ug/kg/min via INTRAVENOUS

## 2020-05-16 MED ORDER — DEXTROSE 5 % IV BOLUS
250.0000 mL | Freq: Once | INTRAVENOUS | Status: AC
  Administered 2020-05-16: 250 mL via INTRAVENOUS

## 2020-05-16 SURGICAL SUPPLY — 15 items

## 2020-05-16 NOTE — Telephone Encounter (Signed)
Patient's wife called stating that she had to take him back to [facility] Sunday and he was admitted. Mrs. Delap stated that her husband had a GI procedure today and she feels that he will be going home in the next couple of days. Patient's wife stated that he is going to require a lot more care at home than she may be able to give. Patient's wife stated that when he was sent home from [facility] was notified and they are only able to come out 3 times a week. Patient's wife wants to know if her husband should have palliative care? Advised patient's wife she should speak with a Education officer, museum at the hospital to find out what services he would qualify for. Advised Mrs. Minder that this message will go back to Dr. Damita Dunnings for his review.

## 2020-05-16 NOTE — Discharge Summary (Signed)
Physician Discharge Summary  EVEN BUDLONG YHC:[id number] DOB: [birth year] DOA: 05/14/2020  PCP: Tonia Ghent, MD  Admit date: 05/14/2020 Discharge date: 05/16/2020  Admitted From: Home Disposition:  Home with HHPT  Recommendations for Outpatient Follow-up:  1. Follow up with PCP in 1-2 weeks 2. Please obtain BMP/CBC in one week  Home Health: Yes w/ PT Equipment/Devices: None  Discharge Condition:Stable  CODE STATUS: DNI  Diet recommendation:  As tolerated  Brief/Interim Summary: [name] Small a 86 y.o.malewith medical history significant ofcoronary artery disease, COPD on home O2, diabetes type 2, hypertension, chronic kidney disease stage IIIb, HFpEF, peripheral vascular disease, bladder cancer, status post left hip replacement with recent left femoral fracture following a fall in the early part of June. He has been in inpatient rehab from [id number] and was just sent home. He was doing well at home for approximately 1 day when he began to develop nausea all day yesterday. He then began to get sick and had coffee-ground emesis for multiple episodes yesterday.Having regular soft dark black stools. His wife reports 2 wounds on his backside. He has been wheelchair-bound or bedbound since his surgery and is still having difficulty bearing weight on his left side. She reports has had poor appetite but no abdominal pain. He is currently incontinent of both stool and urine at home. She is working to get the house set up with a wheelchair ramp.In the ED he was noted to have hemoglobin of 11.6 and abnormal blood sugar of 251 and normal INR at 1.0.  Patient admitted as above with concerns for hematemesis given coffee-ground emesis reported by wife.  Patient has had prolonged stay at [facility] status post hip fracture and follow-up at [facility] recently discharged home.  Patient's hemoglobin remains remarkably stable, FOBT negative, upper endoscopy with GI does show a LA grade C esophagitis  with no bleeding, 1 superficial esophageal ulcer with no notable bleeding as well as scattered mild inflammation of the gastric body and antrum again without overt bleeding.  Biopsies were taken by GI, follow-up in the outpatient setting for pathology.  Given patient's stable hemoglobin, findings on endoscopy and essentially resolution of symptoms patient is otherwise stable for discharge home.  Patient will be discharged home to continue home health at home given his ongoing ambulatory dysfunction status post recent surgical repair of the left femur.  Patient will be discharged on high-dose omeprazole 40 mg daily per GI, patient's home medications were otherwise unaltered.  Patient will need close follow-up with PCP in the next 1 to 2 weeks for repeat labs to ensure no ongoing or worsening anemia as well as to monitor for symptoms.  Follow-up with GI as scheduled for pathology follow-up and work-up as indicated per their expertise.  Patient otherwise stable for discharge home.  Discharge Diagnoses:  Active Problems:   Type 2 diabetes mellitus with diabetic polyneuropathy (HCC)   Chronic kidney disease, stage III (moderate)   CAD (coronary artery disease) of artery bypass graft   Bladder cancer (HCC)   Atrial fibrillation (HCC)   Chronic diastolic CHF (congestive heart failure) (HCC)   Hyperlipidemia   HTN (hypertension)   Peripheral arterial disease (HCC)   Weakness generalized   COPD (chronic obstructive pulmonary disease) ([place])   Spinal stenosis of lumbar region with neurogenic claudication   History of hip replacement   Controlled type 2 diabetes mellitus with hyperglycemia, with long-term current use of insulin (HCC)   Femur fracture, left ([place])   Urinary retention with  incomplete bladder emptying   GI bleed   DNR (do not resuscitate) discussion   Palliative care by specialist   Gastritis and gastroduodenitis   Gastroesophageal reflux disease with esophagitis without hemorrhage   Ulcer  of esophagus without bleeding    Discharge Instructions  Discharge Instructions    Call MD for:  difficulty breathing, headache or visual disturbances   Complete by: As directed    Call MD for:  extreme fatigue   Complete by: As directed    Call MD for:  hives   Complete by: As directed    Call MD for:  persistant dizziness or light-headedness   Complete by: As directed    Call MD for:  persistant nausea and vomiting   Complete by: As directed    Call MD for:  severe uncontrolled pain   Complete by: As directed    Call MD for:  temperature >100.4   Complete by: As directed    Diet - low sodium heart healthy   Complete by: As directed    Increase activity slowly   Complete by: As directed      Allergies as of 05/16/2020      Reactions   Actos [pioglitazone Hydrochloride] Other (See Comments)   Held 2012 bladder cancer   Aspirin Other (See Comments)   Held 2012 due to hematuria and bruising.    Ace Inhibitors Cough   Bactrim Rash, Other (See Comments)   Rash, presumed allergy   Gabapentin Nausea And Vomiting   Upset stomach and diarrhea - tolerates low doses   Lyrica [pregabalin] Swelling   swelling   Pravastatin Swelling   Swelling of feet   Sulfa Drugs Cross Reactors Rash      Medication List    TAKE these medications   acetaminophen 325 MG tablet Commonly known as: TYLENOL Take 1-2 tablets (325-650 mg total) by mouth every 4 (four) hours as needed for mild pain.   Apremilast 30 MG Tabs Take 1 tablet by mouth daily.   atorvastatin 10 MG tablet Commonly known as: LIPITOR Take 1 tablet (10 mg total) by mouth daily.   budesonide-formoterol 160-4.5 MCG/ACT inhaler Commonly known as: SYMBICORT Inhale 2 puffs into the lungs 2 (two) times daily as needed (shortness of breath).   calcium carbonate 1250 (500 Ca) MG tablet Commonly known as: OS-CAL - dosed in mg of elemental calcium Take 1 tablet (500 mg of elemental calcium total) by mouth daily with breakfast.    clobetasol 0.05 % external solution Commonly known as: TEMOVATE Apply 1 application topically daily as needed (rash).   docusate sodium 100 MG capsule Commonly known as: COLACE Take 1 capsule (100 mg total) by mouth 3 (three) times daily.   escitalopram 5 MG tablet Commonly known as: Lexapro Take 1 tablet (5 mg total) by mouth daily.   HYDROcodone-acetaminophen 5-325 MG tablet Commonly known as: NORCO/VICODIN Take 1 tablet by mouth every 6 (six) hours as needed for moderate pain.   insulin glargine 100 UNIT/ML Solostar Pen Commonly known as: LANTUS Inject 10 Units into the skin 2 (two) times daily.   iron polysaccharides 150 MG capsule Commonly known as: NIFEREX Take 1 capsule (150 mg total) by mouth daily after supper.   melatonin 5 MG Tabs Take 1 tablet (5 mg total) by mouth at bedtime as needed (sleep).   methocarbamol 500 MG tablet Commonly known as: ROBAXIN Take 1 tablet (500 mg total) by mouth every 6 (six) hours as needed for muscle spasms.  multivitamin with minerals Tabs tablet Take 1 tablet by mouth daily.   omeprazole 40 MG capsule Commonly known as: PRILOSEC Take 1 capsule (40 mg total) by mouth daily.   polyethylene glycol 17 g packet Commonly known as: MIRALAX / GLYCOLAX Take 17 g by mouth daily.   senna 8.6 MG Tabs tablet Commonly known as: SENOKOT Take 2 tablets (17.2 mg total) by mouth at bedtime.   tamsulosin 0.4 MG Caps capsule Commonly known as: FLOMAX Take 2 capsules (0.8 mg total) by mouth daily after supper.   tiotropium 18 MCG inhalation capsule Commonly known as: SPIRIVA Place 1 capsule (18 mcg total) into inhaler and inhale daily.   traMADol 50 MG tablet Commonly known as: ULTRAM Take 1 tablet (50 mg total) by mouth every 6 (six) hours.   umeclidinium bromide 62.5 MCG/INH Aepb Commonly known as: INCRUSE ELLIPTA Inhale 1 puff into the lungs daily.       Allergies  Allergen Reactions  . Actos [Pioglitazone Hydrochloride]  Other (See Comments)    Held 2012 bladder cancer  . Aspirin Other (See Comments)    Held 2012 due to hematuria and bruising.   . Ace Inhibitors Cough  . Bactrim Rash and Other (See Comments)    Rash, presumed allergy  . Gabapentin Nausea And Vomiting    Upset stomach and diarrhea - tolerates low doses  . Lyrica [Pregabalin] Swelling    swelling  . Pravastatin Swelling    Swelling of feet  . Sulfa Drugs Cross Reactors Rash    Consultations:  GI, Dr. Bryan Lemma   Procedures/Studies: CT Abdomen Pelvis Wo Contrast  Result Date: 05/14/2020 CLINICAL DATA:  86 year old male with history of abdominal distension. Spitting up blood and cough a grounds for 1 day. Generalized abdominal pain. EXAM: CT ABDOMEN AND PELVIS WITHOUT CONTRAST TECHNIQUE: Multidetector CT imaging of the abdomen and pelvis was performed following the standard protocol without IV contrast. COMPARISON:  No priors. FINDINGS: Lower chest: Small bilateral pleural effusions lying dependently (left greater than right). Atherosclerotic calcifications in the descending thoracic aorta as well as the left main, left anterior descending, left circumflex and right coronary arteries. Calcifications of the aortic valve. Hepatobiliary: No definite suspicious cystic or solid hepatic lesions are confidently identified on today's noncontrast CT examination. Unenhanced appearance of the gallbladder is unremarkable. Pancreas: No definite pancreatic mass or peripancreatic fluid collections or inflammatory changes are noted on today's noncontrast CT examination. Spleen: Unremarkable. Adrenals/Urinary Tract: Moderate bilateral renal atrophy. Unenhanced appearance of the kidneys and bilateral adrenal glands is otherwise unremarkable. No hydroureteronephrosis. Urinary bladder is unremarkable in appearance. Stomach/Bowel: Unenhanced appearance of the stomach is unremarkable. No pathologic dilatation of small bowel or colon. A few scattered colonic  diverticulae are noted, most evident in the sigmoid colon, without surrounding inflammatory changes to suggest an acute diverticulitis at this time. The appendix is not confidently identified and may be surgically absent. Regardless, there are no inflammatory changes noted adjacent to the cecum to suggest the presence of an acute appendicitis at this time. Vascular/Lymphatic: Aortic atherosclerosis. Mildly enlarged left para-aortic lymph node adjacent to the left renal hilum measuring 1.5 cm in short axis (axial image 33 of series 2). No other lymphadenopathy noted in the abdomen or pelvis. Reproductive: Penile implant incidentally noted. Prostate gland and seminal vesicles are poorly evaluated secondary to beam hardening artifact from the patient's left hip arthroplasty. Other: No significant volume of ascites.  No pneumoperitoneum. Musculoskeletal: The status post left hip arthroplasty. There are no aggressive appearing lytic  or blastic lesions noted in the visualized portions of the skeleton. IMPRESSION: 1. No acute findings are noted in the abdomen or pelvis to account for the patient's symptoms. 2. Small bilateral pleural effusions lying dependently. 3. Mildly enlarged left para-aortic retroperitoneal lymph node adjacent to the left renal hilum. This is of uncertain etiology and significance. No other lymphadenopathy is noted elsewhere in the abdomen or pelvis. 4. Moderate bilateral renal atrophy. 5. Colonic diverticulosis without evidence of acute diverticulitis at this time. 6. Aortic atherosclerosis. 7. There are calcifications of the aortic valve. Echocardiographic correlation for evaluation of potential valvular dysfunction may be warranted if clinically indicated. Electronically Signed   By: Vinnie Langton M.D.   On: 05/14/2020 09:56   DG Chest 1 View  Result Date: 04/18/2020 CLINICAL DATA:  Pain following fall EXAM: CHEST  1 VIEW COMPARISON:  September 25, 2018 FINDINGS: Lungs are clear. Heart size  and pulmonary vascularity are normal. No adenopathy. There is aortic atherosclerosis. No pneumothorax. No fracture noted in the chest region. IMPRESSION: Lungs clear. Cardiac silhouette normal. Aortic Atherosclerosis (ICD10-I70.0). Electronically Signed   By: Lowella Grip III M.D.   On: 04/18/2020 13:44   DG ELBOW COMPLETE RIGHT (3+VIEW)  Result Date: 05/02/2020 CLINICAL DATA:  Fall with elbow laceration EXAM: RIGHT ELBOW - COMPLETE 3+ VIEW COMPARISON:  None. FINDINGS: There is no evidence of fracture, dislocation, or joint effusion. There is no evidence of arthropathy or other focal bone abnormality. Soft tissues are unremarkable. IMPRESSION: Negative. Electronically Signed   By: Donavan Foil M.D.   On: 05/02/2020 15:49   CT HEAD WO CONTRAST  Result Date: 04/24/2020 CLINICAL DATA:  Status post fall.  Neuro deficits. EXAM: CT HEAD WITHOUT CONTRAST TECHNIQUE: Contiguous axial images were obtained from the base of the skull through the vertex without intravenous contrast. COMPARISON:  None. FINDINGS: Brain: No evidence of acute infarction, hemorrhage, hydrocephalus, extra-axial collection or mass lesion/mass effect. There is mild diffuse low-attenuation within the subcortical and periventricular white matter compatible with chronic microvascular disease. Generalized brain atrophy. Vascular: No hyperdense vessel or unexpected calcification. Skull: Normal. Negative for fracture or focal lesion. Sinuses/Orbits: No acute finding. Other: None IMPRESSION: 1. No acute intracranial abnormalities. 2. Chronic small vessel ischemic change and brain atrophy. Electronically Signed   By: Kerby Moors M.D.   On: 04/24/2020 20:46   US RENAL  Result Date: 04/22/2020 CLINICAL DATA:  Acute renal injury EXAM: RENAL / URINARY TRACT ULTRASOUND COMPLETE COMPARISON:  05/18/2019 FINDINGS: Right Kidney: Renal measurements: 11.9 x 5.8 x 5.7 cm. = volume: 207 mL. Cortical thinning is noted. Increased echogenicity is seen as  well. No hydronephrosis is noted. Left Kidney: Renal measurements: 12.4 x 6.1 x 6.4 cm. = volume: 254 mL. Cortical thinning with increased echogenicity is noted. No hydronephrosis is noted. Bladder: Decompressed by Foley catheter. Other: None. IMPRESSION: Changes consistent with medical renal disease. No acute abnormality is noted. Electronically Signed   By: Inez Catalina M.D.   On: 04/22/2020 16:01   DG Pelvis Portable  Result Date: 04/18/2020 CLINICAL DATA:  Left hip pain after fall EXAM: PORTABLE PELVIS 1-2 VIEWS COMPARISON:  12/27/2016 FINDINGS: There is an obliquely oriented periprosthetic fracture deformity involving the proximal left femur. Fracture line extends into the greater trochanter. The fracture fragments are in near anatomic alignment. Moderate to advanced right hip osteoarthritis. Penile prosthesis again noted IMPRESSION: Acute obliquely oriented periprosthetic fracture deformity involves the proximal left femur. Electronically Signed   By: Kerby Moors M.D.   On:  04/18/2020 11:37   DG C-Arm 1-60 Min  Result Date: 04/19/2020 CLINICAL DATA:  Left femur repair. EXAM: LEFT FEMUR 2 VIEWS; DG C-ARM 1-60 MIN COMPARISON:  Preoperative radiograph yesterday FINDINGS: Eleven fluoroscopic spot views in the operating room obtained. Lateral plate with locking screws as well as cerclage wire fixation of periprosthetic femur fracture. Total fluoroscopy time is 93 seconds. Total dose 14.73 mGy IMPRESSION: Procedural fluoroscopy during periprosthetic femur fracture fixation. Electronically Signed   By: Keith Rake M.D.   On: 04/19/2020 19:37   DG Hip Unilat W or Wo Pelvis 2-3 Views Left  Result Date: 04/18/2020 CLINICAL DATA:  Pain following fall EXAM: DG HIP (WITH OR WITHOUT PELVIS) 2-3V LEFT COMPARISON:  Pelvis radiograph April 18, 2020 FINDINGS: Frontal and lateral views obtained. There is a total hip replacement on the left. There is a comminuted fracture of the left proximal femur. Specifically,  there is an obliquely oriented periprostatic fracture medially which extends into the proximal femoral diaphysis. There is medial displacement of the distal aspect of this fracture. There is also avulsion along the lateral aspect of the greater trochanter. Appearance stable compared to earlier in the day. No dislocation. No erosive change. Penile prosthesis noted. IMPRESSION: Total hip replacement on the left with fractures along the greater tuberosity and medial proximal femoral diaphysis. The displacement of fracture fragments is stable compared to earlier in the day. The alignment of the total hip replacement is unchanged with prosthetic components remaining well seated. No dislocation. Electronically Signed   By: Lowella Grip III M.D.   On: 04/18/2020 13:43   DG HIP UNILAT WITH PELVIS 2-3 VIEWS RIGHT  Result Date: 05/02/2020 CLINICAL DATA:  [place] in therapy, postop right tender hip EXAM: DG HIP (WITH OR WITHOUT PELVIS) 2-3V RIGHT COMPARISON:  04/18/2020 FINDINGS: Left hip replacement. Partially visualized surgical plate and fixating screws in the left femur. Pubic symphysis and rami appear intact. The SI joints are non widened. No acute displaced fracture or malalignment. Moderate arthritis of the right hip. Penile prosthesis. IMPRESSION: Left hip replacement and postsurgical changes. Negative for acute osseous abnormality. Electronically Signed   By: Donavan Foil M.D.   On: 05/02/2020 15:54   DG FEMUR MIN 2 VIEWS LEFT  Result Date: 05/11/2020 CLINICAL DATA:  Open reduction and internal fixation of left femur fracture. EXAM: LEFT FEMUR 2 VIEWS COMPARISON:  May 02, 2020. FINDINGS: Status post left total hip arthroplasty. Status post surgical internal fixation of periprostatic fracture. This is unchanged compared to prior exam. No new fracture or dislocation is noted. Vascular calcifications are noted. IMPRESSION: Status post left total hip arthroplasty and surgical internal fixation of periprostatic  fracture. No significant change compared to prior exam. Electronically Signed   By: Marijo Conception M.D.   On: 05/11/2020 11:10   DG FEMUR MIN 2 VIEWS LEFT  Result Date: 05/02/2020 CLINICAL DATA:  Postop EXAM: LEFT FEMUR 2 VIEWS COMPARISON:  04/19/2020 FINDINGS: Previous left hip replacement. Stable surgical plate and multiple screw fixation of left periprosthetic fracture. Proximal cerclage wires. Stable fracture alignment. No definitive osseous bridging. Vascular calcifications. Incompletely visualized penile prosthesis. IMPRESSION: Stable surgical plate and screw fixation of left periprosthetic femur fracture with stable alignment and similar appearance of the hardware. Electronically Signed   By: Donavan Foil M.D.   On: 05/02/2020 15:52   DG FEMUR MIN 2 VIEWS LEFT  Result Date: 04/19/2020 CLINICAL DATA:  Left femur repair. EXAM: LEFT FEMUR 2 VIEWS; DG C-ARM 1-60 MIN COMPARISON:  Preoperative radiograph  yesterday FINDINGS: Eleven fluoroscopic spot views in the operating room obtained. Lateral plate with locking screws as well as cerclage wire fixation of periprosthetic femur fracture. Total fluoroscopy time is 93 seconds. Total dose 14.73 mGy IMPRESSION: Procedural fluoroscopy during periprosthetic femur fracture fixation. Electronically Signed   By: Keith Rake M.D.   On: 04/19/2020 19:37   DG FEMUR PORT MIN 2 VIEWS LEFT  Result Date: 04/19/2020 CLINICAL DATA:  Postop. EXAM: LEFT FEMUR PORTABLE 2 VIEWS COMPARISON:  Preoperative radiograph yesterday FINDINGS: Lateral plate and multi screw fixation of periprosthetic left proximal femur fracture. Additional cerclage wires. Left hip arthroplasty remains. Recent postsurgical change includes air and edema in the adjacent soft tissues. IMPRESSION: Lateral plate and screw fixation of periprosthetic left proximal femur fracture. Electronically Signed   By: Keith Rake M.D.   On: 04/19/2020 19:41     Subjective: No acute issues or events  overnight, patient in good spirits this morning working with physical therapy with wife at bedside.  Denies nausea, vomiting, diarrhea, constipation, headache, fevers, chills.   Discharge Exam: Vitals:   05/16/20 1508 05/16/20 1510  BP:  (!) 168/63  Pulse: 70 70  Resp: (!) 21 19  Temp:    SpO2: 97% 98%   Vitals:   05/16/20 1301 05/16/20 1504 05/16/20 1508 05/16/20 1510  BP: (!) 168/68 (!) 131/51  (!) 168/63  Pulse: 68 67 70 70  Resp: 15 20 (!) 21 19  Temp: 97.9 F (36.6 C)     TempSrc: Oral     SpO2: 90% 96% 97% 98%  Weight:      Height:        General: Pt is alert, awake, not in acute distress Cardiovascular: RRR, S1/S2 +, no rubs, no gallops Respiratory: CTA bilaterally, no wheezing, no rhonchi Abdominal: Soft, NT, ND, bowel sounds + Extremities: no edema, no cyanosis    The results of significant diagnostics from this hospitalization (including imaging, microbiology, ancillary and laboratory) are listed below for reference.     Microbiology: Recent Results (from the past 240 hour(s))  SARS Coronavirus 2 by RT PCR (hospital order, performed in [facility] hospital lab) Nasopharyngeal Nasopharyngeal Swab     Status: None   Collection Time: 05/14/20 11:29 AM   Specimen: Nasopharyngeal Swab  Result Value Ref Range Status   SARS Coronavirus 2 NEGATIVE NEGATIVE Final    Comment: (NOTE) SARS-CoV-2 target nucleic acids are NOT DETECTED.  The SARS-CoV-2 RNA is generally detectable in upper and lower respiratory specimens during the acute phase of infection. The lowest concentration of SARS-CoV-2 viral copies this assay can detect is 250 copies / mL. A negative result does not preclude SARS-CoV-2 infection and should not be used as the sole basis for treatment or other patient management decisions.  A negative result may occur with improper specimen collection / handling, submission of specimen other than nasopharyngeal swab, presence of viral mutation(s) within  the areas targeted by this assay, and inadequate number of viral copies (<250 copies / mL). A negative result must be combined with clinical observations, patient history, and epidemiological information.  Fact Sheet for Patients:   StrictlyIdeas.no  Fact Sheet for Healthcare Providers: BankingDealers.co.za  This test is not yet approved or  cleared by the [place] FDA and has been authorized for detection and/or diagnosis of SARS-CoV-2 by FDA under an Emergency Use Authorization (EUA).  This EUA will remain in effect (meaning this test can be used) for the duration of the COVID-19 declaration under Section 564(b)(1)  of the Act, 21 U.S.C. section 360bbb-3(b)(1), unless the authorization is terminated or revoked sooner.  Performed at [facility], [place] [address], [place], [place] [address]      Labs: BNP (last 3 results) No results for input(s): BNP in the last 8760 hours. Basic Metabolic Panel: Recent Labs  Lab 05/14/20 0929 05/15/20 0552  NA 135 137  K 4.6 4.4  CL 98 103  CO2 26 26  GLUCOSE 251* 163*  BUN 27* 23  CREATININE 1.74* 1.52*  CALCIUM 7.9* 7.9*   Liver Function Tests: Recent Labs  Lab 05/14/20 0929  AST 18  ALT 10  ALKPHOS 86  BILITOT 1.1  PROT 6.9  ALBUMIN 3.4*   Recent Labs  Lab 05/14/20 0929  LIPASE 25   No results for input(s): AMMONIA in the last 168 hours. CBC: Recent Labs  Lab 05/14/20 0929 05/14/20 1623 05/15/20 0552 05/16/20 0434  WBC 10.2 9.9 7.3 7.1  NEUTROABS 8.6*  --   --   --   HGB 11.6* 10.9* 10.6* 10.1*  HCT 37.3* 35.1* 34.5* 32.6*  MCV 105.1* 105.7* 107.1* 106.5*  PLT 255 286 230 203   Cardiac Enzymes: No results for input(s): CKTOTAL, CKMB, CKMBINDEX, TROPONINI in the last 168 hours. BNP: Invalid input(s): POCBNP CBG: Recent Labs  Lab 05/15/20 1648 05/15/20 2202 05/16/20 0730 05/16/20 1137 05/16/20 1309  GLUCAP 74 113* 74 70 79    D-Dimer No results for input(s): DDIMER in the last 72 hours. Hgb A1c No results for input(s): HGBA1C in the last 72 hours. Lipid Profile No results for input(s): CHOL, HDL, LDLCALC, TRIG, CHOLHDL, LDLDIRECT in the last 72 hours. Thyroid function studies Recent Labs    05/14/20 1623  TSH 1.448   Anemia work up No results for input(s): VITAMINB12, FOLATE, FERRITIN, TIBC, IRON, RETICCTPCT in the last 72 hours. Urinalysis    Component Value Date/Time   COLORURINE YELLOW 04/30/2020 0336   APPEARANCEUR TURBID (A) 04/30/2020 0336   LABSPEC 1.011 04/30/2020 0336   PHURINE 5.0 04/30/2020 0336   GLUCOSEU >=500 (A) 04/30/2020 0336   HGBUR LARGE (A) 04/30/2020 0336   HGBUR trace-intact 11/25/2007 0937   BILIRUBINUR NEGATIVE 04/30/2020 0336   KETONESUR NEGATIVE 04/30/2020 0336   PROTEINUR 100 (A) 04/30/2020 0336   UROBILINOGEN 0.2 01/18/2014 0719   NITRITE POSITIVE (A) 04/30/2020 0336   LEUKOCYTESUR LARGE (A) 04/30/2020 0336   Sepsis Labs Invalid input(s): PROCALCITONIN,  WBC,  LACTICIDVEN Microbiology Recent Results (from the past 240 hour(s))  SARS Coronavirus 2 by RT PCR (hospital order, performed in [facility] hospital lab) Nasopharyngeal Nasopharyngeal Swab     Status: None   Collection Time: 05/14/20 11:29 AM   Specimen: Nasopharyngeal Swab  Result Value Ref Range Status   SARS Coronavirus 2 NEGATIVE NEGATIVE Final    Comment: (NOTE) SARS-CoV-2 target nucleic acids are NOT DETECTED.  The SARS-CoV-2 RNA is generally detectable in upper and lower respiratory specimens during the acute phase of infection. The lowest concentration of SARS-CoV-2 viral copies this assay can detect is 250 copies / mL. A negative result does not preclude SARS-CoV-2 infection and should not be used as the sole basis for treatment or other patient management decisions.  A negative result may occur with improper specimen collection / handling, submission of specimen other than nasopharyngeal  swab, presence of viral mutation(s) within the areas targeted by this assay, and inadequate number of viral copies (<250 copies / mL). A negative result must be combined with clinical observations, patient  history, and epidemiological information.  Fact Sheet for Patients:   StrictlyIdeas.no  Fact Sheet for Healthcare Providers: BankingDealers.co.za  This test is not yet approved or  cleared by the [place] FDA and has been authorized for detection and/or diagnosis of SARS-CoV-2 by FDA under an Emergency Use Authorization (EUA).  This EUA will remain in effect (meaning this test can be used) for the duration of the COVID-19 declaration under Section 564(b)(1) of the Act, 21 U.S.C. section 360bbb-3(b)(1), unless the authorization is terminated or revoked sooner.  Performed at [facility], [place] [address], [place], [place] [address]      Time coordinating discharge: Over 30 minutes  SIGNED:   Little Ishikawa, DO [facility] 05/16/2020, 3:21 PM Pager   If 7PM-7AM, please contact night-coverage [email or web address]

## 2020-05-16 NOTE — H&P (View-Only) (Signed)
Patient ID: ROLLAN ROGER, male   DOB: 06-18-1928, 86 y.o.   MRN: [id number]    Progress Note   Subjective   Day # 2  CC:N/V , coffee ground emesis- heme neg  Wife at bedside, patient without complaints, no bowel movement today, no further nausea or vomiting  HGB 10.1- down from 10.9   7/4  Objective   Vital signs in last 24 hours: Temp:  [97.4 F (36.3 C)-97.7 F (36.5 C)] 97.6 F (36.4 C) (07/06 0559) Pulse Rate:  [58-63] 58 (07/06 0559) Resp:  [18] 18 (07/06 0559) BP: (134-142)/(64-67) 142/65 (07/06 0559) SpO2:  [92 %-95 %] 95 % (07/06 0559) Last BM Date: 05/14/20 General:    Elderly white male in NAD Heart:  Regular rate and rhythm; no murmurs Lungs: Respirations even and unlabored, lungs CTA bilaterally Abdomen:  Soft, large, protuberant, nontender nondistended. Normal bowel sounds. Extremities:  Without edema. Neurologic:  Alert and oriented,  grossly normal neurologically. Psych:  Cooperative. Normal mood and affect.  Intake/Output from previous day: 07/05 0701 - 07/06 0700 In: 3325.5 [P.O.:960; I.V.:2365.5] Out: 1950 [Urine:1950] Intake/Output this shift: No intake/output data recorded.  Lab Results: Recent Labs    05/14/20 1623 05/15/20 0552 05/16/20 0434  WBC 9.9 7.3 7.1  HGB 10.9* 10.6* 10.1*  HCT 35.1* 34.5* 32.6*  PLT 286 230 203   BMET Recent Labs    05/14/20 0929 05/15/20 0552  NA 135 137  K 4.6 4.4  CL 98 103  CO2 26 26  GLUCOSE 251* 163*  BUN 27* 23  CREATININE 1.74* 1.52*  CALCIUM 7.9* 7.9*   LFT Recent Labs    05/14/20 0929  PROT 6.9  ALBUMIN 3.4*  AST 18  ALT 10  ALKPHOS 86  BILITOT 1.1   PT/INR Recent Labs    05/14/20 0929  LABPROT 12.9  INR 1.0    Studies/Results: CT Abdomen Pelvis Wo Contrast  Result Date: 05/14/2020 CLINICAL DATA:  86 year old male with history of abdominal distension. Spitting up blood and cough a grounds for 1 day. Generalized abdominal pain. EXAM: CT ABDOMEN AND PELVIS WITHOUT CONTRAST  TECHNIQUE: Multidetector CT imaging of the abdomen and pelvis was performed following the standard protocol without IV contrast. COMPARISON:  No priors. FINDINGS: Lower chest: Small bilateral pleural effusions lying dependently (left greater than right). Atherosclerotic calcifications in the descending thoracic aorta as well as the left main, left anterior descending, left circumflex and right coronary arteries. Calcifications of the aortic valve. Hepatobiliary: No definite suspicious cystic or solid hepatic lesions are confidently identified on today's noncontrast CT examination. Unenhanced appearance of the gallbladder is unremarkable. Pancreas: No definite pancreatic mass or peripancreatic fluid collections or inflammatory changes are noted on today's noncontrast CT examination. Spleen: Unremarkable. Adrenals/Urinary Tract: Moderate bilateral renal atrophy. Unenhanced appearance of the kidneys and bilateral adrenal glands is otherwise unremarkable. No hydroureteronephrosis. Urinary bladder is unremarkable in appearance. Stomach/Bowel: Unenhanced appearance of the stomach is unremarkable. No pathologic dilatation of small bowel or colon. A few scattered colonic diverticulae are noted, most evident in the sigmoid colon, without surrounding inflammatory changes to suggest an acute diverticulitis at this time. The appendix is not confidently identified and may be surgically absent. Regardless, there are no inflammatory changes noted adjacent to the cecum to suggest the presence of an acute appendicitis at this time. Vascular/Lymphatic: Aortic atherosclerosis. Mildly enlarged left para-aortic lymph node adjacent to the left renal hilum measuring 1.5 cm in short axis (axial image 33 of series 2). No other lymphadenopathy  noted in the abdomen or pelvis. Reproductive: Penile implant incidentally noted. Prostate gland and seminal vesicles are poorly evaluated secondary to beam hardening artifact from the patient's left  hip arthroplasty. Other: No significant volume of ascites.  No pneumoperitoneum. Musculoskeletal: The status post left hip arthroplasty. There are no aggressive appearing lytic or blastic lesions noted in the visualized portions of the skeleton. IMPRESSION: 1. No acute findings are noted in the abdomen or pelvis to account for the patient's symptoms. 2. Small bilateral pleural effusions lying dependently. 3. Mildly enlarged left para-aortic retroperitoneal lymph node adjacent to the left renal hilum. This is of uncertain etiology and significance. No other lymphadenopathy is noted elsewhere in the abdomen or pelvis. 4. Moderate bilateral renal atrophy. 5. Colonic diverticulosis without evidence of acute diverticulitis at this time. 6. Aortic atherosclerosis. 7. There are calcifications of the aortic valve. Echocardiographic correlation for evaluation of potential valvular dysfunction may be warranted if clinically indicated. Electronically Signed   By: Vinnie Langton M.D.   On: 05/14/2020 09:56       Assessment / Plan:    #23 86 year old male status post recent left femoral fracture requiring ORIF on 04/19/2020 who we were asked to see for coffee-ground emesis and drift in hemoglobin.  Hemoccult was negative yesterday and hemoglobin relatively stable.  Hemoglobin has drifted this morning down to 10.1.  Rule out erosive esophagitis, peptic ulcer disease, gastritis, occult neoplasm.  #2 adult onset diabetes mellitus 3.  Chronic kidney disease stage III 4.  COPD on home O2 as needed 5.  Coronary artery disease 6.  History of bladder cancer status post resection  Plan; patient is scheduled for EGD this afternoon with Dr. Lyndel Safe. Continue IV PPI twice daily Continue serial hemoglobins and transfuse as indicated.  Patient had been on Lovenox, currently on hold.       Active Problems:   Type 2 diabetes mellitus with diabetic polyneuropathy (HCC)   Chronic kidney disease, stage III (moderate)    CAD (coronary artery disease) of artery bypass graft   Bladder cancer (HCC)   Atrial fibrillation (HCC)   Chronic diastolic CHF (congestive heart failure) (HCC)   Hyperlipidemia   HTN (hypertension)   Peripheral arterial disease (HCC)   Weakness generalized   COPD (chronic obstructive pulmonary disease) ([place])   Spinal stenosis of lumbar region with neurogenic claudication   History of hip replacement   Controlled type 2 diabetes mellitus with hyperglycemia, with long-term current use of insulin ([place])   Femur fracture, left ([place])   Urinary retention with incomplete bladder emptying   GI bleed   DNR (do not resuscitate) discussion   Palliative care by specialist     LOS: 2 days   Chermaine Schnyder EsterwoodPA-C  05/16/2020, 9:04 AM

## 2020-05-16 NOTE — Telephone Encounter (Signed)
[place] Night - Client TELEPHONE ADVICE RECORD [facility] Patient Name: [name] Small Gender: Male DOB: [birth year] Age: 86 Y 10 M 87 D Return Phone Number: [phone] (Primary), [phone] (Secondary) Address[place] [address] Client [place] [facility] [place] Night - Client Client Site [place] Physician [name] Small - MD Contact Type Call Who Is Calling Patient / Member / Family / Caregiver Call Type Triage / Clinical Caller Name [name] Small Relationship To Patient Spouse Return Phone Number [phone] (Primary) Chief Complaint Vomiting Reason for Call Symptomatic / Request for Health Information Initial Comment PT has been spitting up for no reason since midnight with nausea. Has not been able to keep his medication down. [place] Not Listed Calling 911 at this time per daughter and wife Translation No Nurse Assessment Nurse: [name] Retort, RN, [name] Small Date/Time (Eastern Time): 05/14/2020 7:15:01 AM Confirm and document reason for call. If symptomatic, describe symptoms. ---PT has been spitting up for no reason since midnight with nausea. Has not been able to keep his medication down. EMT was at the home last night and vitals were good. Vomiting noted, black in color. No fever. No other s/s at this time. These s/s started last night. Has the patient had close contact with a person known or suspected to have the novel coronavirus illness OR traveled / lives in area with major community spread (including international travel) in the last 14 days from the onset of symptoms? * If Asymptomatic, screen for exposure and travel within the last 14 days. ---No Does the patient have any new or worsening symptoms? ---Yes Will a triage be completed? ---Yes Related visit to physician within the last 2 weeks? ---No Does the PT have any chronic conditions? (i.e. diabetes, asthma, this includes High risk  factors for pregnancy, etc.) ---Yes List chronic conditions. ---Diabetes, HTN, COPD, heart issues, PAD Is this a behavioral health or substance abuse call? ---No PLEASE NOTE: All timestamps contained within this report are represented as [place] Standard Time. CONFIDENTIALTY NOTICE: This fax transmission is intended only for the addressee. It contains information that is legally privileged, confidential or otherwise protected from use or disclosure. If you are not the intended recipient, you are strictly prohibited from reviewing, disclosing, copying using or disseminating any of this information or taking any action in reliance on or regarding this information. If you have received this fax in error, please notify us immediately by telephone so that we can arrange for its return to [place]. Phone: [phone], Toll-Free: [phone], Fax: [phone] Page: 2 of 2 Call Id: [id number] Guidelines Guideline Title Affirmed Question Affirmed Notes Nurse Date/Time [name] Small Time) Vomiting Blood Weak, dizzy or lightheaded [name] Small 05/14/2020 7:17:12 AM Disp. Time [name] Small Time) Disposition Final User 05/14/2020 7:20:21 AM Go to ED Now Yes [name] Retort, RN, [name] Small Disagree/Comply Comply Caller Understands Yes PreDisposition InappropriateToAsk Care Advice Given Per Guideline GO TO ED NOW: * You need to be seen in the Emergency Department. * Go to the ED at ___________ [place] now. Drive carefully. DRIVING: * Another adult should drive. Do not delay going to the Emergency Department. BRING A BUCKET IN CASE OF VOMITING: * You may wish to bring a bucket, pan, or sack with you in case there is more vomiting during the drive. CARE ADVICE given per Vomiting Blood (Adult) guideline. Comments User: [name] Landry, RN Date/Time [name] Small Time): 05/14/2020 7:21:39 AM Caller states that the PT is vomiting "coffee ground" looking material. The  daughter and wife state they can not get the PT into a  car to drive him to the ED so they are calling 911 for assistance to the ED at this time. Referrals GO TO FACILITY OTHER - SPECIFY

## 2020-05-16 NOTE — Transfer of Care (Signed)
Immediate Anesthesia Transfer of Care Note  Patient: [name] Small  Procedure(s) Performed: Procedure(s): ESOPHAGOGASTRODUODENOSCOPY (EGD) WITH PROPOFOL (N/A) BIOPSY  Patient Location: PACU  Anesthesia Type:MAC  Level of Consciousness:  sedated, patient cooperative and responds to stimulation  Airway & Oxygen Therapy:Patient Spontanous Breathing and Patient connected to face mask oxgen  Post-op Assessment:  Report given to PACU RN and Post -op Vital signs reviewed and stable  Post vital signs:  Reviewed and stable  Last Vitals:  Vitals:   05/16/20 0559 05/16/20 1301  BP: (!) 142/65 (!) 168/68  Pulse: (!) 58 68  Resp: 18 15  Temp: 36.4 C 36.6 C  SpO2: 76% 19%    Complications: No apparent anesthesia complications

## 2020-05-16 NOTE — Anesthesia Preprocedure Evaluation (Signed)
Anesthesia Evaluation  Patient identified by MRN, date of birth, ID band Patient awake    Reviewed: Allergy & Precautions, NPO status , Patient's Chart, lab work & pertinent test results  Airway Mallampati: II  TM Distance: >3 FB Neck ROM: Full    Dental no notable dental hx.    Pulmonary neg pulmonary ROS, former smoker,    Pulmonary exam normal breath sounds clear to auscultation       Cardiovascular hypertension, + Peripheral Vascular Disease and + DOE  Normal cardiovascular exam Rhythm:Regular Rate:Normal     Neuro/Psych negative neurological ROS  negative psych ROS   GI/Hepatic negative GI ROS, Neg liver ROS,   Endo/Other  diabetes  Renal/GU Renal InsufficiencyRenal disease  negative genitourinary   Musculoskeletal negative musculoskeletal ROS (+)   Abdominal   Peds negative pediatric ROS (+)  Hematology  (+) anemia ,   Anesthesia Other Findings   Reproductive/Obstetrics negative OB ROS                             Anesthesia Physical Anesthesia Plan  ASA: III  Anesthesia Plan: MAC   Post-op Pain Management:    Induction: Intravenous  PONV Risk Score and Plan: 0  Airway Management Planned: Simple Face Mask  Additional Equipment:   Intra-op Plan:   Post-operative Plan:   Informed Consent: I have reviewed the patients History and Physical, chart, labs and discussed the procedure including the risks, benefits and alternatives for the proposed anesthesia with the patient or authorized representative who has indicated his/her understanding and acceptance.     Dental advisory given  Plan Discussed with: CRNA and Surgeon  Anesthesia Plan Comments:         Anesthesia Quick Evaluation

## 2020-05-16 NOTE — Interval H&P Note (Signed)
History and Physical Interval Note:  05/16/2020 2:10 PM  [name] Small  has presented today for surgery, with the diagnosis of upper gastrointestinal bleeding.  The various methods of treatment have been discussed with the patient and family. After consideration of risks, benefits and other options for treatment, the patient has consented to  Procedure(s): ESOPHAGOGASTRODUODENOSCOPY (EGD) WITH PROPOFOL (N/A) as a surgical intervention.  The patient's history has been reviewed, patient examined, no change in status, stable for surgery.  I have reviewed the patient's chart and labs.  Questions were answered to the patient's satisfaction.     Dominic Pea Lan Mcneill

## 2020-05-16 NOTE — Consult Note (Signed)
   [facility] CM Inpatient Consult   05/16/2020  [name] Small [birth year] [id number]   Patient chart has been reviewed for readmissions less than 30 days and for high risk score for unplanned readmissions.  Patient assessed for community [name] Management follow up needs.   Will continue to follow for progression and disposition plans. Of note, [facility] Care Management services does not replace or interfere with any services that are arranged by inpatient case management or social work.  [name], MSN, [facility] Liaison Nurse Mobile Phone [phone]  Toll free office [phone]

## 2020-05-16 NOTE — Telephone Encounter (Signed)
Per chart review pt was admitted to [facility].

## 2020-05-16 NOTE — Progress Notes (Signed)
Physical Therapy Treatment Patient Details Name: [name] Small MRN: [id number] DOB: 05/19/1928 Today's Date: 05/16/2020    History of Present Illness 86 y.o. male with medical history significant of coronary artery disease, COPD on home O2, diabetes type 2, hypertension, chronic kidney disease stage IIIb, HFpEF, peripheral vascular disease, bladder cancer, status post left hip replacement with recent left femoral fracture following a fall in the early part of June--s/p ORIF L femur ~ 04/20/20. pt went to CIR, home for 1 day and returned to hospital with GI Bleed    PT Comments    Pt progressing toward goals. Declined OOB as he is going for EGD early this afternoon.  agreable to EOB and LE exercises. Pt is very motivated, wife supportive.  Follow Up Recommendations  Home health PT;Supervision for mobility/OOB     Equipment Recommendations  None recommended by PT    Recommendations for Other Services       Precautions / Restrictions Precautions Precautions: Fall Precaution Comments: TDWB LLE Restrictions Weight Bearing Restrictions: No LLE Weight Bearing: Touchdown weight bearing    Mobility  Bed Mobility Overal bed mobility: Needs Assistance Bed Mobility: Supine to Sit;Sit to Supine     Supine to sit: Supervision Sit to supine: Supervision   General bed mobility comments: leg lifter used to come to sit, able to lift LEs on to bed of his own volition with cues and incr time   Transfers Overall transfer level: Needs assistance               General transfer comment: scooting along EOB, pt declined OOB (to go for EGD early pm)  Ambulation/Gait                 Stairs             Wheelchair Mobility    Modified Rankin (Stroke Patients Only)       Balance                                            Cognition Arousal/Alertness: Awake/alert Behavior During Therapy: WFL for tasks assessed/performed Overall Cognitive Status:  Within Functional Limits for tasks assessed                                        Exercises General Exercises - Lower Extremity Ankle Circles/Pumps: AROM;Both;20 reps;Seated Short Arc Quad: AROM;10 reps;Left Long Arc Quad: AROM;Both;10 reps;Seated Heel Slides: AAROM;Left;10 reps;Supine Straight Leg Raises: AROM;AAROM;Left;10 reps    General Comments        Pertinent Vitals/Pain Pain Assessment: No/denies pain    Home Living                      Prior Function            PT Goals (current goals can now be found in the care plan section) Acute Rehab PT Goals Patient Stated Goal: to get stronger and return home PT Goal Formulation: With patient/family Time For Goal Achievement: 05/29/20 Potential to Achieve Goals: Good Progress towards PT goals: Progressing toward goals    Frequency    Min 3X/week      PT Plan Current plan remains appropriate    Co-evaluation  AM-PAC PT "6 Clicks" Mobility   Outcome Measure  Help needed turning from your back to your side while in a flat bed without using bedrails?: A Little Help needed moving from lying on your back to sitting on the side of a flat bed without using bedrails?: A Little Help needed moving to and from a bed to a chair (including a wheelchair)?: A Little Help needed standing up from a chair using your arms (e.g., wheelchair or bedside chair)?: A Lot Help needed to walk in hospital room?: Total Help needed climbing 3-5 steps with a railing? : Total 6 Click Score: 13    End of Session Equipment Utilized During Treatment: Gait belt Activity Tolerance: Patient tolerated treatment well Patient left: in bed;with call bell/phone within reach;with bed alarm set;with family/visitor present   PT Visit Diagnosis: Unsteadiness on feet (R26.81);Difficulty in walking, not elsewhere classified (R26.2);Muscle weakness (generalized) (M62.81) Pain - Right/Left: Left Pain - part of  body: Hip     Time: 6754-4920 PT Time Calculation (min) (ACUTE ONLY): 31 min  Charges:  $Therapeutic Exercise: 8-22 mins $Therapeutic Activity: 8-22 mins                     Baxter Flattery, PT  Acute Rehab Dept ([place]) [phone] Pager [phone]  05/16/2020    [facility] 05/16/2020, 12:53 PM

## 2020-05-16 NOTE — Anesthesia Postprocedure Evaluation (Signed)
Anesthesia Post Note  Patient: [name] Small  Procedure(s) Performed: ESOPHAGOGASTRODUODENOSCOPY (EGD) WITH PROPOFOL (N/A ) BIOPSY     Patient location during evaluation: Endoscopy Anesthesia Type: MAC Level of consciousness: awake and alert Pain management: pain level controlled Vital Signs Assessment: post-procedure vital signs reviewed and stable Respiratory status: spontaneous breathing, nonlabored ventilation, respiratory function stable and patient connected to nasal cannula oxygen Cardiovascular status: blood pressure returned to baseline and stable Postop Assessment: no apparent nausea or vomiting Anesthetic complications: no   No complications documented.  Last Vitals:  Vitals:   05/16/20 1532 05/16/20 1544  BP:  (!) 150/71  Pulse:  69  Resp:  18  Temp:  36.4 C  SpO2: 92% 91%    Last Pain:  Vitals:   05/16/20 1544  TempSrc: Oral  PainSc:                  Barnet Glasgow

## 2020-05-16 NOTE — Progress Notes (Addendum)
Patient ID: [name] Small, male   DOB: Oct 28, 1928, 86 y.o.   MRN: [id number]    Progress Note   Subjective   Day # 2  CC:N/V , coffee ground emesis- heme neg  Wife at bedside, patient without complaints, no bowel movement today, no further nausea or vomiting  HGB 10.1- down from 10.9   7/4  Objective   Vital signs in last 24 hours: Temp:  [97.4 F (36.3 C)-97.7 F (36.5 C)] 97.6 F (36.4 C) (07/06 0559) Pulse Rate:  [58-63] 58 (07/06 0559) Resp:  [18] 18 (07/06 0559) BP: (134-142)/(64-67) 142/65 (07/06 0559) SpO2:  [92 %-95 %] 95 % (07/06 0559) Last BM Date: 05/14/20 General:    Elderly white male in NAD Heart:  Regular rate and rhythm; no murmurs Lungs: Respirations even and unlabored, lungs CTA bilaterally Abdomen:  Soft, large, protuberant, nontender nondistended. Normal bowel sounds. Extremities:  Without edema. Neurologic:  Alert and oriented,  grossly normal neurologically. Psych:  Cooperative. Normal mood and affect.  Intake/Output from previous day: 07/05 0701 - 07/06 0700 In: 3325.5 [P.O.:960; I.V.:2365.5] Out: 1950 [Urine:1950] Intake/Output this shift: No intake/output data recorded.  Lab Results: Recent Labs    05/14/20 1623 05/15/20 0552 05/16/20 0434  WBC 9.9 7.3 7.1  HGB 10.9* 10.6* 10.1*  HCT 35.1* 34.5* 32.6*  PLT 286 230 203   BMET Recent Labs    05/14/20 0929 05/15/20 0552  NA 135 137  K 4.6 4.4  CL 98 103  CO2 26 26  GLUCOSE 251* 163*  BUN 27* 23  CREATININE 1.74* 1.52*  CALCIUM 7.9* 7.9*   LFT Recent Labs    05/14/20 0929  PROT 6.9  ALBUMIN 3.4*  AST 18  ALT 10  ALKPHOS 86  BILITOT 1.1   PT/INR Recent Labs    05/14/20 0929  LABPROT 12.9  INR 1.0    Studies/Results: CT Abdomen Pelvis Wo Contrast  Result Date: 05/14/2020 CLINICAL DATA:  86 year old male with history of abdominal distension. Spitting up blood and cough a grounds for 1 day. Generalized abdominal pain. EXAM: CT ABDOMEN AND PELVIS WITHOUT CONTRAST  TECHNIQUE: Multidetector CT imaging of the abdomen and pelvis was performed following the standard protocol without IV contrast. COMPARISON:  No priors. FINDINGS: Lower chest: Small bilateral pleural effusions lying dependently (left greater than right). Atherosclerotic calcifications in the descending thoracic aorta as well as the left main, left anterior descending, left circumflex and right coronary arteries. Calcifications of the aortic valve. Hepatobiliary: No definite suspicious cystic or solid hepatic lesions are confidently identified on today's noncontrast CT examination. Unenhanced appearance of the gallbladder is unremarkable. Pancreas: No definite pancreatic mass or peripancreatic fluid collections or inflammatory changes are noted on today's noncontrast CT examination. Spleen: Unremarkable. Adrenals/Urinary Tract: Moderate bilateral renal atrophy. Unenhanced appearance of the kidneys and bilateral adrenal glands is otherwise unremarkable. No hydroureteronephrosis. Urinary bladder is unremarkable in appearance. Stomach/Bowel: Unenhanced appearance of the stomach is unremarkable. No pathologic dilatation of small bowel or colon. A few scattered colonic diverticulae are noted, most evident in the sigmoid colon, without surrounding inflammatory changes to suggest an acute diverticulitis at this time. The appendix is not confidently identified and may be surgically absent. Regardless, there are no inflammatory changes noted adjacent to the cecum to suggest the presence of an acute appendicitis at this time. Vascular/Lymphatic: Aortic atherosclerosis. Mildly enlarged left para-aortic lymph node adjacent to the left renal hilum measuring 1.5 cm in short axis (axial image 33 of series 2). No other lymphadenopathy  noted in the abdomen or pelvis. Reproductive: Penile implant incidentally noted. Prostate gland and seminal vesicles are poorly evaluated secondary to beam hardening artifact from the patient's left  hip arthroplasty. Other: No significant volume of ascites.  No pneumoperitoneum. Musculoskeletal: The status post left hip arthroplasty. There are no aggressive appearing lytic or blastic lesions noted in the visualized portions of the skeleton. IMPRESSION: 1. No acute findings are noted in the abdomen or pelvis to account for the patient's symptoms. 2. Small bilateral pleural effusions lying dependently. 3. Mildly enlarged left para-aortic retroperitoneal lymph node adjacent to the left renal hilum. This is of uncertain etiology and significance. No other lymphadenopathy is noted elsewhere in the abdomen or pelvis. 4. Moderate bilateral renal atrophy. 5. Colonic diverticulosis without evidence of acute diverticulitis at this time. 6. Aortic atherosclerosis. 7. There are calcifications of the aortic valve. Echocardiographic correlation for evaluation of potential valvular dysfunction may be warranted if clinically indicated. Electronically Signed   By: Vinnie Langton M.D.   On: 05/14/2020 09:56       Assessment / Plan:    #47 86 year old male status post recent left femoral fracture requiring ORIF on 04/19/2020 who we were asked to see for coffee-ground emesis and drift in hemoglobin.  Hemoccult was negative yesterday and hemoglobin relatively stable.  Hemoglobin has drifted this morning down to 10.1.  Rule out erosive esophagitis, peptic ulcer disease, gastritis, occult neoplasm.  #2 adult onset diabetes mellitus 3.  Chronic kidney disease stage III 4.  COPD on home O2 as needed 5.  Coronary artery disease 6.  History of bladder cancer status post resection  Plan; patient is scheduled for EGD this afternoon with Dr. Lyndel Safe. Continue IV PPI twice daily Continue serial hemoglobins and transfuse as indicated.  Patient had been on Lovenox, currently on hold.       Active Problems:   Type 2 diabetes mellitus with diabetic polyneuropathy (HCC)   Chronic kidney disease, stage III (moderate)    CAD (coronary artery disease) of artery bypass graft   Bladder cancer (HCC)   Atrial fibrillation (HCC)   Chronic diastolic CHF (congestive heart failure) (HCC)   Hyperlipidemia   HTN (hypertension)   Peripheral arterial disease (HCC)   Weakness generalized   COPD (chronic obstructive pulmonary disease) ([place])   Spinal stenosis of lumbar region with neurogenic claudication   History of hip replacement   Controlled type 2 diabetes mellitus with hyperglycemia, with long-term current use of insulin ([place])   Femur fracture, left ([place])   Urinary retention with incomplete bladder emptying   GI bleed   DNR (do not resuscitate) discussion   Palliative care by specialist     LOS: 2 days   Boluwatife Flight EsterwoodPA-C  05/16/2020, 9:04 AM

## 2020-05-16 NOTE — Telephone Encounter (Signed)
[place] Night - Client TELEPHONE ADVICE RECORD [facility] Patient Name: MARKALE BIRDSELL Gender: Male DOB: 1928-02-23 Age: 86 Y 50 M 78 D Return Phone Number: [phone] (Primary), [phone] (Secondary) Address[place] [address] Client [place] [facility] [place] Night - Client Client Site [place] Physician Renford Dills - MD Contact Type Call Who Is Calling Patient / Member / Family / Caregiver Call Type Triage / Clinical Caller Name [name] Relationship To Patient Spouse Return Phone Number [phone] (Primary) Chief Complaint Vomiting Reason for Call Symptomatic / Request for Health Information Initial Comment PT has been spitting up for no reason since midnight with nausea. Has not been able to keep his medication down. [place] Not Listed Calling 911 at this time per daughter and wife Translation No Nurse Assessment Nurse: Noberto Retort, RN, Malvin Johns Date/Time (Eastern Time): 05/14/2020 7:15:01 AM Confirm and document reason for call. If symptomatic, describe symptoms. ---PT has been spitting up for no reason since midnight with nausea. Has not been able to keep his medication down. EMT was at the home last night and vitals were good. Vomiting noted, black in color. No fever. No other s/s at this time. These s/s started last night. Has the patient had close contact with a person known or suspected to have the novel coronavirus illness OR traveled / lives in area with major community spread (including international travel) in the last 14 days from the onset of symptoms? * If Asymptomatic, screen for exposure and travel within the last 14 days. ---No Does the patient have any new or worsening symptoms? ---Yes Will a triage be completed? ---Yes Related visit to physician within the last 2 weeks? ---No Does the PT have any chronic conditions? (i.e. diabetes, asthma, this includes High risk  factors for pregnancy, etc.) ---Yes List chronic conditions. ---Diabetes, HTN, COPD, heart issues, PAD Is this a behavioral health or substance abuse call? ---NoPLEASE NOTE: All timestamps contained within this report are represented as [place] Standard Time. CONFIDENTIALTY NOTICE: This fax transmission is intended only for the addressee. It contains information that is legally privileged, confidential or otherwise protected from use or disclosure. If you are not the intended recipient, you are strictly prohibited from reviewing, disclosing, copying using or disseminating any of this information or taking any action in reliance on or regarding this information. If you have received this fax in error, please notify us immediately by telephone so that we can arrange for its return to [place]. Phone: [phone], Toll-Free: [phone], Fax: [phone] Page: 2 of 2 Call Id: [id number] Guidelines Guideline Title Affirmed Question Affirmed Notes Nurse Date/Time Eilene Ghazi Time) Vomiting Blood Weak, dizzy or lightheaded Nino Parsley 05/14/2020 7:17:12 AM Disp. Time Eilene Ghazi Time) Disposition Final User 05/14/2020 7:20:21 AM Go to ED Now Yes Noberto Retort, RN, Renne Musca Disagree/Comply Comply Caller Understands Yes PreDisposition InappropriateToAsk Care Advice Given Per Guideline GO TO ED NOW: * You need to be seen in the Emergency Department. * Go to the ED at ___________ [place] now. Drive carefully. DRIVING: * Another adult should drive. Do not delay going to the Emergency Department. BRING A BUCKET IN CASE OF VOMITING: * You may wish to bring a bucket, pan, or sack with you in case there is more vomiting during the drive. CARE ADVICE given per Vomiting Blood (Adult) guideline. Comments User: Marya Landry, RN Date/Time Eilene Ghazi Time): 05/14/2020 7:21:39 AM Caller states that the PT is vomiting "coffee ground" looking material. The daughter  and wife state they can not get the PT into a car  to drive him to the ED so they are calling 911 for assistance to the ED at this time. Referrals GO TO FACILITY OTHER - SPECIFY

## 2020-05-16 NOTE — Op Note (Signed)
[facility] Patient Name: [name] Small Procedure Date: 05/16/2020 MRN: [id number] Attending MD: Gerrit Heck , MD Date of Birth: 12/02/1927 CSN: [id number] Age: 86 Admit Type: Inpatient Procedure:                Upper GI endoscopy Indications:              Acute post hemorrhagic anemia, Coffee-ground                            emesis, Nausea with vomiting Providers:                Gerrit Heck, MD, Cleda Daub, RN, Elspeth Cho Tech., Technician, Anne Fu                            CRNA, CRNA Referring MD:              Medicines:                Monitored Anesthesia Care Complications:            No immediate complications. Estimated Blood Loss:     Estimated blood loss was minimal. Procedure:                Pre-Anesthesia Assessment:                           - Prior to the procedure, a History and Physical                            was performed, and patient medications and                            allergies were reviewed. The patient's tolerance of                            previous anesthesia was also reviewed. The risks                            and benefits of the procedure and the sedation                            options and risks were discussed with the patient.                            All questions were answered, and informed consent                            was obtained. Prior Anticoagulants: The patient has                            taken no previous anticoagulant or antiplatelet                            agents. ASA Grade  Assessment: III - A patient with                            severe systemic disease. After reviewing the risks                            and benefits, the patient was deemed in                            satisfactory condition to undergo the procedure.                           After obtaining informed consent, the endoscope was                            passed under direct  vision. Throughout the                            procedure, the patient's blood pressure, pulse, and                            oxygen saturations were monitored continuously. The                            GIF-H190 ([id number] Olympus gastroscope was                            introduced through the mouth, and advanced to the                            second part of duodenum. The upper GI endoscopy was                            accomplished without difficulty. The patient                            tolerated the procedure well. Scope In: Scope Out: Findings:      LA Grade C (one or more mucosal breaks continuous between tops of 2 or       more mucosal folds, less than 75% circumference) esophagitis with no       bleeding was found in the lower third of the esophagus.      One superficial esophageal ulcer with no stigmata of recent bleeding was       found 42 cm from the incisors (2 cm proximal to the LES). The lesion was       3 mm in largest dimension. Biopsies were taken with a cold forceps for       histology. Estimated blood loss was minimal. The remaining mid and upper       esophagus were normal appearing.      Scattered mild inflammation characterized by erythema was found in the       gastric body and in the gastric antrum. The reggae were less pronounced.       Several biopsies were taken with a cold forceps for histology and  evaluation for Helicobacter pylori. Estimated blood loss was minimal.      The duodenal bulb, first portion of the duodenum and second portion of       the duodenum were normal. Impression:               - LA Grade C esophagitis with no bleeding.                           - Esophageal ulcer with no stigmata of recent                            bleeding. Biopsied.                           - Gastritis. Biopsied.                           - Normal duodenal bulb, first portion of the                            duodenum and second portion of the  duodenum. Moderate Sedation:      Not Applicable - Patient had care per Anesthesia. Recommendation:           - Return patient to hospital ward for ongoing care.                           - Resume previous diet.                           - Continue present medications.                           - Use Protonix (pantoprazole) 40 mg PO BID for 8                            weeks to promote mucosal healing.                           - Await pathology results. Procedure Code(s):        --- Professional ---                           [phone], Esophagogastroduodenoscopy, flexible,                            transoral; with biopsy, single or multiple Diagnosis Code(s):        --- Professional ---                           K20.90, Esophagitis, unspecified without bleeding                           K22.10, Ulcer of esophagus without bleeding                           K29.70, Gastritis, unspecified, without bleeding  D62, Acute posthemorrhagic anemia                           K92.0, Hematemesis                           R11.2, Nausea with vomiting, unspecified CPT copyright 2019 American Medical Association. All rights reserved. The codes documented in this report are preliminary and upon coder review may  be revised to meet current compliance requirements. Gerrit Heck, MD 05/16/2020 3:02:46 PM Number of Addenda: 0

## 2020-05-16 NOTE — Telephone Encounter (Signed)
Will await inpatient notes.   

## 2020-05-16 NOTE — Progress Notes (Signed)
D/C instructions given to patient and wife, and daughter. Patient or family had no questions. NT or writer will wheel patient out once family bring the car

## 2020-05-16 NOTE — TOC Transition Note (Signed)
Transition of Care [facility]) - CM/SW Discharge Note   Patient Details  Name: [name] Small MRN: [id number] Date of Birth: 09-12-1928  [facility] of [facility]) CM/SW Contact:  Lennart Pall, LCSW Phone Number: 05/16/2020, 4:41 PM   Clinical Narrative: Met with pt, wife and daughter to discuss dc assistance needs.  Wife feeling very overwhelmed and we talked through different support services at home.  The patient is open to [facility] for PT and we have added RN visits to the orders.  Discussed option of hiring a private duty aide which is financially possible for them.  Strongly encouraged they do this for a few hours or more each day until they have settled back in to a home routine - all are agreeable and understand process of contacting agencies.  Wife was educated on catheterizations while pt on CIR and is reviewing again with RN on this unit.  Pt has resisted wife's attempts to cath him at home - he states he now understands that this is NOT negotiable and must be done at regular intervals throughout the day.  All seemed appreciative of information and feeling better about d/c home today.      Final next level of care: Home w Home Health Services Barriers to Discharge: Barriers Resolved   Patient Goals and CMS Choice Patient states their goals for this hospitalization and ongoing recovery are:: go home      Discharge Placement                       Discharge Plan and Services                DME Arranged: N/A (has all needed DME from CIR dc) DME Agency: NA       HH Arranged: RN, PT [place] Agency: [facility] (now [facility]) Date [place]: 05/16/20 Time [place]: [place] Representative spoke with at [place]: Joen Laura (to restart [place])  Social Determinants of Health (SDOH) Interventions     Readmission Risk Interventions Readmission Risk Prevention Plan 04/21/2020  Transportation Screening Complete  PCP or Specialist  Appt within 5-7 Days Complete  Home Care Screening Complete  Medication Review (RN CM) Complete  Some recent data might be hidden

## 2020-05-17 ENCOUNTER — Other Ambulatory Visit: Payer: Self-pay

## 2020-05-17 ENCOUNTER — Telehealth: Payer: Self-pay

## 2020-05-17 ENCOUNTER — Telehealth: Payer: Self-pay | Admitting: Family Medicine

## 2020-05-17 NOTE — Telephone Encounter (Signed)
Spoke with Mrs Hanrahan and Mr [place] is sitting on his I pad now and Mrs Willems said she thinks they are doing OK. Mrs Ferg said why would they need hospice now. I advised pt Dr Damita Dunnings was willing to order hospice if Mr Lahaie was OK with him ordering hospice. Mrs Ledo said no to hospice now and if things changed where hospice was needed she would call and request hospice. Mrs Hanover said nothing is needed now.

## 2020-05-17 NOTE — Telephone Encounter (Signed)
Late entry.  Talked with patient and family when they were on the way home last night after discharge.  At that point they thought they had what they needed to get him cared for at home.  They have a ramp set up to try to help get him in the house.  They want to update me as needed.  I thanked him for taking the call.  =====================  Addendum for 05/17/20-I am okay with ordering hospice if the patient consents to this.  I need verification on that first.  Please let me know.

## 2020-05-17 NOTE — Telephone Encounter (Addendum)
Noted- this was to f/u the prev incoming call from family. Thanks for checking on this.  This is why I need verification from the patient, in situations like this.

## 2020-05-17 NOTE — Telephone Encounter (Signed)
Contacted [name] Small at [facility] and gave verbal order for nursing/PT/OT.

## 2020-05-17 NOTE — Telephone Encounter (Signed)
[name] Small pts daughter left v/m (DPR not signed) that she does not feel like pt is well enough to be taken care of at home. In V/m [name] Small said that pts wife leaves him alone and [name] Small does not want pt to fall . See pt message on 05/16/20. Pt discharged from [facility] on [facility]/16/20. [name] Small said hospice has given Lattie Haw pts other daughter some options; [name] Small wants to know if Dr Damita Dunnings could order hospice of [place].Please advise.

## 2020-05-17 NOTE — Telephone Encounter (Signed)
[name] Small - [facility] called and is requesting VO Pt was discharged home from the hospital and no orders were put in to continue PT/OT or any other services.   [name] Small is wanting to know if Dr Damita Dunnings wants to continue PT/OT services. She also feels like the patient need nursing services - wife is overwhelmed with the amount of care he is requiring.  Also any other services that Dr Damita Dunnings wants to add on are also okay to add.   Please advise, thanks.

## 2020-05-17 NOTE — Telephone Encounter (Signed)
Please give the order for nursing/PT/OT.  Thanks.

## 2020-05-18 LAB — SURGICAL PATHOLOGY

## 2020-05-18 NOTE — Telephone Encounter (Signed)
See following/follow-up notes.

## 2020-05-19 ENCOUNTER — Telehealth: Payer: Self-pay

## 2020-05-19 ENCOUNTER — Encounter (HOSPITAL_COMMUNITY): Payer: Self-pay | Admitting: Gastroenterology

## 2020-05-19 NOTE — Telephone Encounter (Signed)
Cara RN with [facility] left v/m requesting verbal orders for [facility] nursing 1 x a wk for 4 wks to follow  GI bleed and UTI prevention and pressure ulcer prevention and med mgt education. Verbal order for evaluation request for HH PT, [facility] OT, and [place] social worker.

## 2020-05-19 NOTE — Telephone Encounter (Signed)
Called and spoke to [place] - gave verbal okay for [place] and evaluation for PT OT and SW.

## 2020-05-19 NOTE — Telephone Encounter (Signed)
Please give the order.  Thanks.   

## 2020-05-20 ENCOUNTER — Emergency Department (HOSPITAL_COMMUNITY)
Admission: EM | Admit: 2020-05-20 | Discharge: 2020-05-20 | Disposition: A | Discharge status: Home / Self Care | Payer: Medicare Other | Attending: Emergency Medicine | Admitting: Emergency Medicine

## 2020-05-20 ENCOUNTER — Other Ambulatory Visit: Payer: Self-pay

## 2020-05-20 DIAGNOSIS — J449 Chronic obstructive pulmonary disease, unspecified: Secondary | ICD-10-CM | POA: Insufficient documentation

## 2020-05-20 DIAGNOSIS — R531 Weakness: Secondary | ICD-10-CM

## 2020-05-20 DIAGNOSIS — N183 Chronic kidney disease, stage 3 unspecified: Secondary | ICD-10-CM | POA: Insufficient documentation

## 2020-05-20 DIAGNOSIS — E1122 Type 2 diabetes mellitus with diabetic chronic kidney disease: Secondary | ICD-10-CM | POA: Diagnosis not present

## 2020-05-20 DIAGNOSIS — Z87891 Personal history of nicotine dependence: Secondary | ICD-10-CM | POA: Diagnosis not present

## 2020-05-20 DIAGNOSIS — Z79899 Other long term (current) drug therapy: Secondary | ICD-10-CM | POA: Insufficient documentation

## 2020-05-20 DIAGNOSIS — I130 Hypertensive heart and chronic kidney disease with heart failure and stage 1 through stage 4 chronic kidney disease, or unspecified chronic kidney disease: Secondary | ICD-10-CM | POA: Diagnosis not present

## 2020-05-20 DIAGNOSIS — Z743 Need for continuous supervision: Secondary | ICD-10-CM | POA: Diagnosis not present

## 2020-05-20 DIAGNOSIS — M6250 Muscle wasting and atrophy, not elsewhere classified, unspecified site: Secondary | ICD-10-CM | POA: Diagnosis not present

## 2020-05-20 DIAGNOSIS — R197 Diarrhea, unspecified: Secondary | ICD-10-CM | POA: Diagnosis not present

## 2020-05-20 DIAGNOSIS — R109 Unspecified abdominal pain: Secondary | ICD-10-CM | POA: Diagnosis not present

## 2020-05-20 DIAGNOSIS — E860 Dehydration: Secondary | ICD-10-CM | POA: Diagnosis not present

## 2020-05-20 DIAGNOSIS — I5032 Chronic diastolic (congestive) heart failure: Secondary | ICD-10-CM | POA: Insufficient documentation

## 2020-05-20 DIAGNOSIS — I2510 Atherosclerotic heart disease of native coronary artery without angina pectoris: Secondary | ICD-10-CM | POA: Insufficient documentation

## 2020-05-20 DIAGNOSIS — Z794 Long term (current) use of insulin: Secondary | ICD-10-CM | POA: Diagnosis not present

## 2020-05-20 DIAGNOSIS — R58 Hemorrhage, not elsewhere classified: Secondary | ICD-10-CM | POA: Diagnosis not present

## 2020-05-20 LAB — COMPREHENSIVE METABOLIC PANEL
ALT: 10 U/L (ref 0–44)
AST: 19 U/L (ref 15–41)
Albumin: 3.1 g/dL — ABNORMAL LOW (ref 3.5–5.0)
Alkaline Phosphatase: 85 U/L (ref 38–126)
Anion gap: 9 (ref 5–15)
BUN: 15 mg/dL (ref 8–23)
CO2: 28 mmol/L (ref 22–32)
Calcium: 7.7 mg/dL — ABNORMAL LOW (ref 8.9–10.3)
Chloride: 98 mmol/L (ref 98–111)
Creatinine, Ser: 1.25 mg/dL — ABNORMAL HIGH (ref 0.61–1.24)
GFR calc Af Amer: 59 mL/min — ABNORMAL LOW (ref >60)
GFR calc non Af Amer: 51 mL/min — ABNORMAL LOW (ref >60)
Glucose, Bld: 237 mg/dL — ABNORMAL HIGH (ref 70–99)
Potassium: 4.3 mmol/L (ref 3.5–5.1)
Sodium: 135 mmol/L (ref 135–145)
Total Bilirubin: 0.8 mg/dL (ref 0.3–1.2)
Total Protein: 6.4 g/dL — ABNORMAL LOW (ref 6.5–8.1)

## 2020-05-20 LAB — CBC WITH DIFFERENTIAL/PLATELET
Abs Immature Granulocytes: 0.07 10*3/uL (ref 0.00–0.07)
Basophils Absolute: 0.1 10*3/uL (ref 0.0–0.1)
Basophils Relative: 1 %
Eosinophils Absolute: 0.2 10*3/uL (ref 0.0–0.5)
Eosinophils Relative: 3 %
HCT: 35.7 % — ABNORMAL LOW (ref 39.0–52.0)
Hemoglobin: 11.2 g/dL — ABNORMAL LOW (ref 13.0–17.0)
Immature Granulocytes: 1 %
Lymphocytes Relative: 13 %
Lymphs Abs: 1 10*3/uL (ref 0.7–4.0)
MCH: 32.7 pg (ref 26.0–34.0)
MCHC: 31.4 g/dL (ref 30.0–36.0)
MCV: 104.4 fL — ABNORMAL HIGH (ref 80.0–100.0)
Monocytes Absolute: 0.9 10*3/uL (ref 0.1–1.0)
Monocytes Relative: 12 %
Neutro Abs: 5.5 10*3/uL (ref 1.7–7.7)
Neutrophils Relative %: 70 %
Platelets: 200 10*3/uL (ref 150–400)
RBC: 3.42 MIL/uL — ABNORMAL LOW (ref 4.22–5.81)
RDW: 14.3 % (ref 11.5–15.5)
WBC: 7.8 10*3/uL (ref 4.0–10.5)
nRBC: 0 % (ref 0.0–0.2)

## 2020-05-20 LAB — URINALYSIS, ROUTINE W REFLEX MICROSCOPIC
Bilirubin Urine: NEGATIVE
Glucose, UA: >=500 mg/dL — AB
Ketones, ur: NEGATIVE mg/dL
Leukocytes,Ua: NEGATIVE
Nitrite: NEGATIVE
Protein, ur: NEGATIVE mg/dL
Specific Gravity, Urine: 1.008 (ref 1.005–1.030)
pH: 6 (ref 5.0–8.0)

## 2020-05-20 LAB — MAGNESIUM: Magnesium: 1.8 mg/dL (ref 1.7–2.4)

## 2020-05-20 MED ORDER — MELATONIN 5 MG PO TABS: 10.0000 mg | ORAL_TABLET | Freq: Every evening | ORAL | 0 refills | Status: DC | PRN

## 2020-05-20 MED ORDER — SODIUM CHLORIDE 0.9 % IV BOLUS
500.0000 mL | Freq: Once | INTRAVENOUS | Status: AC
  Administered 2020-05-20: 500 mL via INTRAVENOUS

## 2020-05-20 NOTE — Discharge Instructions (Addendum)
As discussed, your evaluation today has been largely reassuring.  But, it is important that you monitor your condition carefully, and do not hesitate to return to the ED if you develop new, or concerning changes in your condition.  With your ongoing diarrhea, we are adjusting your medication. It is important to discuss this with your physician next week, and have repeat blood test performed in about 1 week to ensure that your blood counts are unchanged.

## 2020-05-20 NOTE — ED Provider Notes (Signed)
[place] DEPT Provider Note   CSN: [id number] Arrival date & time: 05/20/20  1247     History Chief Complaint  Patient presents with  . Dehydration    MARSHA HILLMAN is a 86 y.o. male.  HPI    Patient with multiple medical issues, including recent hospitalization for left hip surgery now presents with concern for 1 day of diarrhea, weakness. Patient is a poor historian, and additional details are obtained by chart review.  Patient himself denies pain, denies weakness, denies near syncope, notes that he had diarrhea once yesterday.  After speaking with his physician today, he was seemingly referred here for evaluation. No clear precipitating, or alleviating factors for his episode of diarrhea, or weakness. Patient denies focality of the weakness, confusion.  He does acknowledge some difficulty with memory.  Discharge summary from hospitalization ending 05/16/2020: MARLO GOODRICH is a 86 y.o. male with medical history significant of coronary artery disease, COPD on home O2, diabetes type 2, hypertension, chronic kidney disease stage IIIb, HFpEF, peripheral vascular disease, bladder cancer, status post left hip replacement with recent left femoral fracture following a fall in the early part of June.  He has been in inpatient rehab from [id number] and was just sent home.  He was doing well at home for approximately 1 day when he began to develop nausea all day yesterday.  He then began to get sick and had coffee-ground emesis for multiple episodes yesterday.  Having regular soft dark black stools.  His wife reports 2 wounds on his backside.  He has been wheelchair-bound or bedbound since his surgery and is still having difficulty bearing weight on his left side.  She reports has had poor appetite but no abdominal pain.  He is currently incontinent of both stool and urine at home.  She is working to get the house set up with a wheelchair ramp. In the ED he was noted  to have hemoglobin of 11.6 and abnormal blood sugar of 251 and normal INR at 1.0.   Patient admitted as above with concerns for hematemesis given coffee-ground emesis reported by wife.  Patient has had prolonged stay at [facility] status post hip fracture and follow-up at [facility] recently discharged home.  Patient's hemoglobin remains remarkably stable, FOBT negative, upper endoscopy with GI does show a LA grade C esophagitis with no bleeding, 1 superficial esophageal ulcer with no notable bleeding as well as scattered mild inflammation of the gastric body and antrum again without overt bleeding.  Biopsies were taken by GI, follow-up in the outpatient setting for pathology.  Given patient's stable hemoglobin, findings on endoscopy and essentially resolution of symptoms patient is otherwise stable for discharge home.  Patient will be discharged home to continue home health at home given his ongoing ambulatory dysfunction status post recent surgical repair of the left femur.  Patient will be discharged on high-dose omeprazole 40 mg daily per GI, patient's home medications were otherwise unaltered.  Patient will need close follow-up with PCP in the next 1 to 2 weeks for repeat labs to ensure no ongoing or worsening anemia as well as to monitor for symptoms.  Follow-up with GI as scheduled for pathology follow-up and work-up as indicated per their expertise.  Patient otherwise stable for discharge home.   Past Medical History:  Diagnosis Date  . (HFpEF) heart failure with preserved ejection fraction ([place])   . Arthritis   . Back pain    s/p lumbar injection 2014  .  BENIGN PROSTATIC HYPERTROPHY 05/21/2007  . Bladder cancer ([place]) 10/2011  . CAD (coronary artery disease)    nonobstructive by cath 6/12:  mid LAD 30%, proximal obtuse marginal-2 30%, proximal RCA 20%, mid RCA 30-40%.  He had normal cardiac output and mildly elevated filling pressures but no significant pulmonary hypertension;   Echocardiogram in May  2012 demonstrated EF 50-55% and left atrial enlargement   . Cellulitis of left foot    Hospitalized in 2006  . Chronic kidney disease (CKD), stage III (moderate) 12/04/2007   FOLLOWED BY DR PATEL  . Chronic pain of lower extremity   . Complication of anesthesia    1 time bladder cancer surgery- 2012 , oxygen satruratioon dropped had to stay overnight, no problems since then.  Marland Kitchen COPD (chronic obstructive pulmonary disease) ([place])   . DIABETES MELLITUS, TYPE II 05/21/2007  . DIVERTICULOSIS, COLON 05/21/2007   pt denies  . Dyspnea    with exertion, patient mointors saturation  . Fatigue AGE-RELATED  . Gross hematuria    pt denies  . HYPERTENSION 05/21/2007  . Impaired hearing BILATERAL HEARING AIDS  . On home oxygen therapy    as needed  . PAD (peripheral artery disease) ([place])    a. 02/2016 L foot nonhealing ulcer-->Periph angio: L pop 100 w/ reconstitution via extensive vollats to the prox-mid peroneal (only patent vessel BK)-->PTA of L Pop and peroneal; b. Ongoing LE ischemia 5 & 06/2016 req amputation of toes on L foot.  . Pneumonia    Hx  of   . Psoriasis ELBOWS  . Psoriasis   . PSORIASIS, SCALP 10/25/2008    Patient Active Problem List   Diagnosis Date Noted  . DNR (do not resuscitate) discussion   . Palliative care by specialist   . Gastritis and gastroduodenitis   . Gastroesophageal reflux disease with esophagitis without hemorrhage   . Ulcer of esophagus without bleeding   . GI bleed 05/14/2020  . Urinary retention with incomplete bladder emptying 05/10/2020  . Adjustment reaction of late life   . Femur fracture, left ([place]) 04/26/2020  . Periprosthetic hip fracture 04/26/2020  . Acute kidney injury ([place])   . Closed left subtrochanteric femur fracture, initial encounter ([place])   . Acute blood loss anemia   . Gross hematuria   . Controlled type 2 diabetes mellitus with hyperglycemia, with long-term current use of insulin ([place])   . Benign essential HTN   . Post-operative  pain   . Closed left hip fracture, initial encounter ([place]) 04/18/2020  . Anxiety 12/15/2019  . Laceration of right thumb without foreign body with damage to nail 12/15/2019  . Anemia 01/31/2017  . Hypoxia   . History of hip replacement 12/26/2016    Class: Chronic  . Status post lumbar laminectomy 12/26/2016    Class: Chronic  . S/p left hip fracture 12/26/2016  . Idiopathic chronic venous hypertension of left lower extremity with inflammation 11/07/2016  . Spinal stenosis of lumbar region with neurogenic claudication 10/18/2016  . Spinal stenosis of lumbar region 09/25/2016  . Fall 07/04/2016  . COPD (chronic obstructive pulmonary disease) ([place]) 06/10/2016  . Weakness generalized 03/18/2016  . Critical lower limb ischemia 02/28/2016  . Peripheral arterial disease ([place]) 10/17/2015  . Edema 07/19/2015  . Claudication ([place]) 01/31/2015  . DOE (dyspnea on exertion) 01/31/2015  . Hyperlipidemia 01/31/2015  . HTN (hypertension) 01/31/2015  . Chronic diastolic CHF (congestive heart failure) ([place]) 01/21/2014  . Atrial fibrillation ([place]) 12/16/2012  . Hyperkalemia 11/27/2011  . Bladder  cancer ([place]) 09/22/2011  . Leg cramps 08/28/2011  . Cough 05/26/2011  . CAD (coronary artery disease) of artery bypass graft 05/09/2011  . Dyspnea on exertion 04/19/2011  . PSORIASIS, SCALP 10/25/2008  . Chronic kidney disease, stage III (moderate) 12/04/2007  . Type 2 diabetes mellitus with diabetic polyneuropathy ([place]) 05/21/2007  . DIVERTICULOSIS, COLON 05/21/2007  . BPH (benign prostatic hyperplasia) 05/21/2007    Past Surgical History:  Procedure Laterality Date  . AMPUTATION Left 03/27/2016   Procedure: partial first AMPUTATION RAY; LEFT;  Surgeon: Trula Slade, DPM;  Location: [place];  Service: Podiatry;  Laterality: Left;  . AMPUTATION Left 06/12/2016   Procedure: TRANSMETATARSAL AMPUTATION LEFT FOOT;  Surgeon: Newt Minion, MD;  Location: [place];  Service: Orthopedics;  Laterality: Left;   . APPENDECTOMY  1941  . BIOPSY  05/16/2020   Procedure: BIOPSY;  Surgeon: Lavena Bullion, DO;  Location: [facility];  Service: Gastroenterology;;  . CARDIAC CATHETERIZATION  04-24-11/  DR Rogue Jury ARIDA   MILD NONOBSTRUCTIVE CAD, NORMA CARDIAC OUTPUT  . CARDIOVASCULAR STRESS TEST  2007  . CATARACT EXTRACTION W/ INTRAOCULAR LENS  IMPLANT, BILATERAL Bilateral ~ 2010  . CYSTOSCOPY  12/25/2011   Procedure: CYSTOSCOPY;  Surgeon: Molli Hazard, MD;  Location[facility];  Service: Urology;  Laterality: N/A;  needs intubation and to be paralyzed   . CYSTOSCOPY W/ RETROGRADES  10/30/2011   Procedure: CYSTOSCOPY WITH RETROGRADE PYELOGRAM;  Surgeon: Molli Hazard, MD;  Location: [facility];  Service: Urology;  Laterality: Bilateral;  CYSTOSCOPY POSS TURBT BILATERAL RETROGRADE PYLEOGRAM   . CYSTOSCOPY W/ RETROGRADES  11/30/2012   Procedure: CYSTOSCOPY WITH RETROGRADE PYELOGRAM;  Surgeon: Molli Hazard, MD;  Location: [facility];  Service: Urology;  Laterality: Bilateral;  Flexible cystoscopy.  . CYSTOSCOPY WITH BIOPSY  11/30/2012   Procedure: CYSTOSCOPY WITH BIOPSY;  Surgeon: Molli Hazard, MD;  Location[facility];  Service: Urology;  Laterality: N/A;  . ESOPHAGOGASTRODUODENOSCOPY (EGD) WITH PROPOFOL N/A 05/16/2020   Procedure: ESOPHAGOGASTRODUODENOSCOPY (EGD) WITH PROPOFOL;  Surgeon: Lavena Bullion, DO;  Location: [facility];  Service: Gastroenterology;  Laterality: N/A;  . INCISION AND DRAINAGE FOOT Left ~ 2005   LEFT FOOT DUE TO INFECTION FROM  NAIL PUNCTURE INJURY  . LUMBAR LAMINECTOMY/DECOMPRESSION MICRODISCECTOMY N/A 10/18/2016   Procedure: LEFT AND CENTRAL L4-5 LUMBAR LAMINECTOMY WITH RESECTION OF SYNOVIAL CYST;  Surgeon: Jessy Oto, MD;  Location: [facility];  Service: Orthopedics;  Laterality: N/A;  . ORIF FEMUR FRACTURE Left 04/19/2020   Procedure: OPEN REDUCTION INTERNAL FIXATION FEMORAL SHAFT  FRACTURE;  Surgeon: Shona Needles, MD;  Location: [place];  Service: Orthopedics;  Laterality: Left;  . PENILE PROSTHESIS IMPLANT  1990s  . PERIPHERAL VASCULAR CATHETERIZATION N/A 02/14/2016   Procedure: Abdominal Aortogram w/Lower Extremity;  Surgeon: Wellington Hampshire, MD;  Location: [facility];  Service: Cardiovascular;  Laterality: N/A;  . PERIPHERAL VASCULAR CATHETERIZATION Left 02/28/2016   Procedure: Peripheral Vascular Balloon Angioplasty;  Surgeon: Wellington Hampshire, MD;  Location: [facility];  Service: Cardiovascular;  Laterality: Left;  left peroneal and popliteal artery  . [place]  . TOTAL HIP ARTHROPLASTY Left 12/27/2016   Procedure: LEFT TOTAL HIP ARTHROPLASTY ANTERIOR APPROACH;  Surgeon: Mcarthur Rossetti, MD;  Location: [facility];  Service: Orthopedics;  Laterality: Left;  . TRANSURETHRAL RESECTION OF BLADDER TUMOR  10/30/2011   Procedure: TRANSURETHRAL RESECTION OF BLADDER TUMOR (TURBT);  Surgeon: Quillian Quince  Julieanne Cotton, MD;  Location: [facility];  Service: Urology;  Laterality: N/A;  . TRANSURETHRAL RESECTION OF BLADDER TUMOR  12/25/2011   Procedure: TRANSURETHRAL RESECTION OF BLADDER TUMOR (TURBT);  Surgeon: Molli Hazard, MD;  Location[facility];  Service: Urology;  Laterality: N/A;  need long gyrus instruments   . VASECTOMY  1990s       Family History  Problem Relation Age of Onset  . Diabetes Mother   . Heart disease Brother   . Heart disease Brother   . Heart disease Brother   . Heart disease Brother   . Heart disease Brother   . Heart disease Brother   . Lung cancer Brother     Social History   Tobacco Use  . Smoking status: Former Smoker    Packs/day: 2.00    Years: 22.00    Pack years: 44.00    Types: Cigarettes    Quit date: 11/11/1968    Years since quitting: 51.5  . Smokeless tobacco: Never Used  Vaping Use  . Vaping Use: Never used  Substance Use Topics  . Alcohol  use: No    Comment: occassional- 10/17/16- none  in 4 months- "too sick"  . Drug use: No    Home Medications Prior to Admission medications   Medication Sig Start Date End Date Taking? Authorizing Provider  acetaminophen (TYLENOL) 325 MG tablet Take 1-2 tablets (325-650 mg total) by mouth every 4 (four) hours as needed for mild pain. 05/11/20  Yes Angiulli, Lavon Paganini, PA-C  Apremilast 30 MG TABS Take 1 tablet by mouth daily. Patient taking differently: Take 30 mg by mouth daily.  03/30/19  Yes Tonia Ghent, MD  atorvastatin (LIPITOR) 10 MG tablet Take 1 tablet (10 mg total) by mouth daily. 05/11/20  Yes Angiulli, Lavon Paganini, PA-C  budesonide-formoterol (SYMBICORT) 160-4.5 MCG/ACT inhaler Inhale 2 puffs into the lungs 2 (two) times daily as needed (shortness of breath). 05/11/20  Yes Angiulli, Lavon Paganini, PA-C  calcium carbonate (OS-CAL - DOSED IN MG OF ELEMENTAL CALCIUM) 1250 (500 Ca) MG tablet Take 1 tablet (500 mg of elemental calcium total) by mouth daily with breakfast. 05/11/20  Yes Angiulli, Lavon Paganini, PA-C  clobetasol (TEMOVATE) 0.05 % external solution Apply 1 application topically daily as needed (rash).    Yes [provider]  docusate sodium (COLACE) 100 MG capsule Take 1 capsule (100 mg total) by mouth 3 (three) times daily. Patient taking differently: Take 100 mg by mouth 3 (three) times daily as needed for moderate constipation.  05/11/20  Yes Angiulli, Lavon Paganini, PA-C  escitalopram (LEXAPRO) 5 MG tablet Take 1 tablet (5 mg total) by mouth daily. 05/11/20  Yes Angiulli, Lavon Paganini, PA-C  HYDROcodone-acetaminophen (NORCO/VICODIN) 5-325 MG tablet Take 1 tablet by mouth every 6 (six) hours as needed for moderate pain. 05/11/20  Yes Angiulli, Lavon Paganini, PA-C  insulin glargine (LANTUS) 100 UNIT/ML Solostar Pen Inject 10 Units into the skin 2 (two) times daily. 05/12/20  Yes Angiulli, Lavon Paganini, PA-C  methocarbamol (ROBAXIN) 500 MG tablet Take 1 tablet (500 mg total) by mouth every 6 (six) hours as  needed for muscle spasms. 05/11/20  Yes Angiulli, Lavon Paganini, PA-C  Multiple Vitamin (MULTIVITAMIN WITH MINERALS) TABS tablet Take 1 tablet by mouth daily. 05/11/20  Yes Angiulli, Lavon Paganini, PA-C  omeprazole (PRILOSEC) 40 MG capsule Take 1 capsule (40 mg total) by mouth daily. 05/16/20  Yes Little Ishikawa, MD  tamsulosin (FLOMAX) 0.4 MG CAPS  capsule Take 2 capsules (0.8 mg total) by mouth daily after supper. 05/11/20  Yes Angiulli, Lavon Paganini, PA-C  tiotropium (SPIRIVA) 18 MCG inhalation capsule Place 1 capsule (18 mcg total) into inhaler and inhale daily. 05/11/20  Yes Angiulli, Lavon Paganini, PA-C  traMADol (ULTRAM) 50 MG tablet Take 1 tablet (50 mg total) by mouth every 6 (six) hours. 05/11/20  Yes Angiulli, Lavon Paganini, PA-C  umeclidinium bromide (INCRUSE ELLIPTA) 62.5 MCG/INH AEPB Inhale 1 puff into the lungs daily. 05/11/20  Yes Angiulli, Lavon Paganini, PA-C  iron polysaccharides (NIFEREX) 150 MG capsule Take 1 capsule (150 mg total) by mouth daily after supper. 05/11/20 05/20/20 Yes Angiulli, Lavon Paganini, PA-C  melatonin 5 MG TABS Take 2 tablets (10 mg total) by mouth at bedtime as needed (sleep). 05/20/20   Carmin Muskrat, MD  polyethylene glycol (MIRALAX / GLYCOLAX) 17 g packet Take 17 g by mouth daily. 05/11/20   Angiulli, Lavon Paganini, PA-C  senna (SENOKOT) 8.6 MG TABS tablet Take 2 tablets (17.2 mg total) by mouth at bedtime. 05/11/20   Angiulli, Lavon Paganini, PA-C    Allergies    Actos [pioglitazone hydrochloride], Aspirin, Ace inhibitors, Bactrim, Gabapentin, Lyrica [pregabalin], Pravastatin, and Sulfa drugs cross reactors  Review of Systems   Review of Systems  Unable to perform ROS: Other  Cognitive impairment, memory loss  Physical Exam Updated Vital Signs BP (!) 160/80   Pulse 67   Temp 97.6 F (36.4 C) (Oral)   Resp 16   SpO2 93%   Physical Exam Vitals and nursing note reviewed.  Constitutional:      General: He is not in acute distress.    Appearance: He is well-developed.  HENT:     Head: Normocephalic  and atraumatic.  Eyes:     Conjunctiva/sclera: Conjunctivae normal.  Cardiovascular:     Rate and Rhythm: Normal rate and regular rhythm.  Pulmonary:     Effort: Pulmonary effort is normal. No respiratory distress.     Breath sounds: No stridor.  Abdominal:     General: There is no distension.     Tenderness: There is no abdominal tenderness. There is no guarding.  Musculoskeletal:       Legs:  Skin:    General: Skin is warm and dry.  Neurological:     Mental Status: He is alert.     Motor: Atrophy present. No tremor.  Psychiatric:        Behavior: Behavior is slowed.        Cognition and Memory: Cognition is impaired. Memory is impaired.      ED Results / Procedures / Treatments   Labs (all labs ordered are listed, but only abnormal results are displayed) Labs Reviewed  COMPREHENSIVE METABOLIC PANEL - Abnormal; Notable for the following components:      Result Value   Glucose, Bld 237 (*)    Creatinine, Ser 1.25 (*)    Calcium 7.7 (*)    Total Protein 6.4 (*)    Albumin 3.1 (*)    GFR calc non Af Amer 51 (*)    GFR calc Af Amer 59 (*)    All other components within normal limits  CBC WITH DIFFERENTIAL/PLATELET - Abnormal; Notable for the following components:   RBC 3.42 (*)    Hemoglobin 11.2 (*)    HCT 35.7 (*)    MCV 104.4 (*)    All other components within normal limits  URINALYSIS, ROUTINE W REFLEX MICROSCOPIC - Abnormal; Notable for the following components:  Glucose, UA >=500 (*)    Hgb urine dipstick MODERATE (*)    Bacteria, UA MANY (*)    All other components within normal limits  MAGNESIUM     Procedures Procedures (including critical care time)  Medications Ordered in ED Medications  sodium chloride 0.9 % bolus 500 mL (500 mLs Intravenous New Bag/Given 05/20/20 1400)    ED Course  I have reviewed the triage vital signs and the nursing notes.  Pertinent labs & imaging results that were available during my care of the patient were reviewed  by me and considered in my medical decision making (see chart for details).     Elderly male with a substantial medical burden, recent hospitalization for hip fracture, followed by hospitalization for possible GI bleed now presents with diarrhea, confusion, weakness. With broad differential, including infection, dehydration, electrolyte imbalance, GI bleed, labs, x-ray, fluids ordered.   4:10 PM Patient accompanied by his wife.  We lengthy conversation about today's evaluation, patient's recent illness, overall medical situation. She confirms the patient has had diarrhea yesterday, today, but indeed has had since his hip surgery, and throughout his recent hospitalization. Some consideration of patient's medications including iron contributing to this. No abdominal pain, no fever, labs are reassuring, hemoglobin stable, no evidence of peritonitis, bacteremia, sepsis, infection.  Patient's urinalysis abnormal for hemoglobinuria, but no infection there either. Given these reassuring findings, we discussed following up with primary care in the coming days, stopping iron, adjusted melatonin, for the patient's sleeping issues.  Patient's wife is amenable to this, patient discharged in stable condition.  MDM Number of Diagnoses or Management Options Diarrhea, unspecified type: established, worsening Weakness: new, needed workup   Amount and/or Complexity of Data Reviewed Clinical lab tests: reviewed Tests in the medicine section of CPT: reviewed Decide to obtain previous medical records or to obtain history from someone other than the patient: yes Obtain history from someone other than the patient: yes Review and summarize past medical records: yes  Risk of Complications, Morbidity, and/or Mortality Presenting problems: high Diagnostic procedures: high Management options: high  Critical Care Total time providing critical care: < 30 minutes  Patient Progress Patient progress:  stable  Final Clinical Impression(s) / ED Diagnoses Final diagnoses:  Weakness  Diarrhea, unspecified type    Rx / DC Orders ED Discharge Orders         Ordered    melatonin 5 MG TABS  At bedtime PRN     Discontinue  Reprint     05/20/20 1602           Carmin Muskrat, MD 05/20/20 1611

## 2020-05-20 NOTE — ED Triage Notes (Signed)
Pt BIBA from home-  Per EMS- Family requested pt be transported here at the advice of PCP.  Family reports pt is dehydrated d/t diarrhea yesterday.    Pt non-ambulatory at baseline. Hx of hip fracture. EMS reports very unsteady on feet.

## 2020-05-20 NOTE — ED Notes (Signed)
Pt straight catheterized at pts request. 750 mL urine output collected.

## 2020-05-20 NOTE — ED Notes (Signed)
Discharge paperwork and prescription reviewed with pt.  Pt with no questions or concerns at this time. Pt assisted to get into car using sara-steady.

## 2020-05-20 NOTE — ED Notes (Signed)
EDP [place] at bedside 

## 2020-05-22 ENCOUNTER — Telehealth: Payer: Self-pay | Admitting: *Deleted

## 2020-05-22 DIAGNOSIS — J449 Chronic obstructive pulmonary disease, unspecified: Secondary | ICD-10-CM | POA: Diagnosis not present

## 2020-05-22 DIAGNOSIS — J9600 Acute respiratory failure, unspecified whether with hypoxia or hypercapnia: Secondary | ICD-10-CM | POA: Diagnosis not present

## 2020-05-22 DIAGNOSIS — R062 Wheezing: Secondary | ICD-10-CM | POA: Diagnosis not present

## 2020-05-22 DIAGNOSIS — I509 Heart failure, unspecified: Secondary | ICD-10-CM | POA: Diagnosis not present

## 2020-05-22 NOTE — Telephone Encounter (Signed)
[facility] Night - Client TELEPHONE ADVICE RECORD [facility] Patient Name: [name] Small Gender: Male DOB: December 25, 1927 Age: 86 Y 62 M 14 D Return Phone Number: [phone] (Primary), [phone] (Secondary) Address: [place] [address] Client [facility] - Client Client Site [place] Physician Renford Dills - MD Contact Type Call Who Is Calling Patient / Member / Family / Caregiver Call Type Triage / Clinical Caller Name Samik Balkcom Relationship To Patient Spouse Return Phone Number [phone] (Primary) Chief Complaint DIARRHEA (<12 years)-and no urine output in 8 hours Reason for Call Symptomatic / Request for [place] states she talked to a nurse earlier today who suggested they bring him t the ER to get an IV. Caller did that but she now wants to get him set up on hospice care. They are still at the ER right now. She states if he had hospice care they wouldn't be going through this now and a nurse could have came to the home and got this taken care of. He has severe diarrhea and he is no longer urinating. She said he is no longer drinking like he should as well. Translation No No Triage Reason Other Nurse Assessment Nurse: Jearld Pies, RN, Lovena Le Date/Time Eilene Ghazi Time): 05/20/2020 1:11:49 PM Confirm and document reason for call. If symptomatic, describe symptoms. ---Caller states she talked to a nurse earlier today who suggested they bring him t the ER to get an IV. Caller did that but she now wants to get him set up on hospice care. They are still at the ER right now. She states if he had hospice care they wouldn't be going through this now and a nurse could have came to the home and got this taken care of. He has severe diarrhea and he is no longer urinating. She said he is no longer drinking like he should as well. Has the patient had  close contact with a person known or suspected to have the novel coronavirus illness OR traveled / lives in area with major community spread (including international travel) in the last 14 days from the onset of symptoms? * If Asymptomatic, screen for exposure and travel within the last 14 days. ---No Does the patient have any new or worsening symptoms? ---Yes Will a triage be completed? ---No Select reason for no triage. ---OtherPLEASE NOTE: All timestamps contained within this report are represented as [place] Standard Time. CONFIDENTIALTY NOTICE: This fax transmission is intended only for the addressee. It contains information that is legally privileged, confidential or otherwise protected from use or disclosure. If you are not the intended recipient, you are strictly prohibited from reviewing, disclosing, copying using or disseminating any of this information or taking any action in reliance on or regarding this information. If you have received this fax in error, please notify us immediately by telephone so that we can arrange for its return to [place]. Phone: [phone], Toll-Free: [phone], Fax: [phone] Page: 2 of 2 Call Id: [id number] Nurse Assessment Please document clinical information provided and list any resource used. ---Pt transported by EMS to [facility] ED. Pt requesting referral for hospice. Advised office will receive fax with information over pt's ED visit and concerns for referral to hospice care. Pt advised to call office during normal business hours for request. Advised to call back for any other concerns. Caller verbalizes understanding. Guidelines Guideline Title Affirmed Question Affirmed Notes Nurse Date/Time (Eastern Time) Disp. Time Eilene Ghazi  Time) Disposition Final User 05/20/2020 1:10:29 PM Send to Urgent Frederica Kuster 05/20/2020 1:16:16 PM Clinical Call Yes Jearld Pies, RN, Lovena Le

## 2020-05-22 NOTE — Telephone Encounter (Signed)
[facility] Night - Client TELEPHONE ADVICE RECORD [facility] Patient Name: [name] Small Gender: Male DOB: May 08, 1928 Age: 86 Y 44 M 15 D Return Phone Number: [phone] (Primary), [phone] (Secondary), [phone] (Alternate) Address: [place] [address] Client  [facility] - Client Client Site [place] Physician Renford Dills - MD Contact Type Call Who Is Calling Patient / Member / Family / Caregiver Call Type Triage / Clinical Caller Name [place] Relationship To Patient Daughter Return Phone Number [phone] (Alternate) Chief Complaint Dehydration (>1 YEAR) Reason for Call Symptomatic / Request for Health Information Initial Comment Father was seen in ER again and needs to get hospice involved. He has been dehydrated and has diarrhea. Translation No Nurse Assessment Nurse: Altamease Oiler, RN, Anastasia Fiedler Date/Time (Eastern Time): 05/20/2020 8:26:04 PM Confirm and document reason for call. If symptomatic, describe symptoms. ---Caller states father was seen at the ED today and states she needs hospice involved. RN advised caller to call Monday to have PCP involved. Has the patient had close contact with a person known or suspected to have the novel coronavirus illness OR traveled / lives in area with major community spread (including international travel) in the last 14 days from the onset of symptoms? * If Asymptomatic, screen for exposure and travel within the last 14 days. ---No Does the patient have any new or worsening symptoms? ---No Guidelines Guideline Title Affirmed Question Affirmed Notes Nurse Date/Time (Eastern Time) Disp. Time Eilene Ghazi Time) Disposition Final User 05/20/2020 8:32:01 PM Clinical Call Yes Altamease Oiler, RN, Anastasia Fiedler

## 2020-05-22 NOTE — Telephone Encounter (Signed)
[facility] Night - Client TELEPHONE ADVICE RECORD [facility] Patient Name: [name] Small Gender: Male DOB: [birth year] Age: 86 Y 25 M 14 D Return Phone Number: [phone] (Primary), [phone] (Secondary) Address: [place] [address] Client [facility] - Client Client Site [place] Physician Renford Dills - MD Contact Type Call Who Is Calling Patient / Member / Family / Caregiver Call Type Triage / Clinical Caller Name Nomar Broad Relationship To Patient Spouse Return Phone Number [phone] (Primary) Chief Complaint Urinary decreased output (> THREE MONTHS and no output in 8 hours) Reason for Call Symptomatic / Request for Health Information Initial Comment Caller's husband takes Iron but it gives him extreme diarrhea. She also asks if they can increase his 5 mg melatonin to help him sleep. Some nights he gets no sleep. He was just discharged from hospital this week. He had another round of extreme diarrhea last night. The hospital prescribed the Iron. Has not urinated in 8 hours. His wife has to catheter him. Translation No Nurse Assessment Nurse: Laurann Montana, RN, Fransico Meadow Date/Time Eilene Ghazi Time): 05/20/2020 9:22:41 AM Confirm and document reason for call. If symptomatic, describe symptoms. ---Caller states that patient was in the hospital. Caller states that patient was put on Iron and it is causing diarrhea. Caller states that patient is also having trouble sleeping. Caller states no other symptoms. Has the patient had close contact with a person known or suspected to have the novel coronavirus illness OR traveled / lives in area with major community spread (including international travel) in the last 14 days from the onset of symptoms? * If Asymptomatic, screen for exposure and travel within the last 14 days. ---No Does the patient have any new or worsening  symptoms? ---Yes Will a triage be completed? ---Yes Related visit to physician within the last 2 weeks? ---No Does the PT have any chronic conditions? (i.e. diabetes, asthma, this includes High risk factors for pregnancy, etc.) ---Yes List chronic conditions. ---DM, COPD, Parkinson's Disease, Neuropathy Is this a behavioral health or substance abuse call? ---NoPLEASE NOTE: All timestamps contained within this report are represented as [place] Standard Time. CONFIDENTIALTY NOTICE: This fax transmission is intended only for the addressee. It contains information that is legally privileged, confidential or otherwise protected from use or disclosure. If you are not the intended recipient, you are strictly prohibited from reviewing, disclosing, copying using or disseminating any of this information or taking any action in reliance on or regarding this information. If you have received this fax in error, please notify us immediately by telephone so that we can arrange for its return to [place]. Phone: [phone], Toll-Free: [phone], Fax: [phone] Page: 2 of 2 Call Id: [id number] Guidelines Guideline Title Affirmed Question Affirmed Notes Nurse Date/Time Eilene Ghazi Time) Diarrhea [1] Recent hospitalization AND [2] diarrhea present > 3 days [place] [facility] 05/20/2020 9:24:25 AM Disp. Time Eilene Ghazi Time) Disposition Final User 05/20/2020 9:34:05 AM See PCP within 24 Hours Yes Laurann Montana, RN, [place] Disagree/Comply Comply Caller Understands Yes PreDisposition [place] Advice Given Per Guideline SEE PCP WITHIN 24 HOURS: * IF OFFICE WILL BE CLOSED: You need to be seen within the next 24 hours. A clinic or an urgent care center is often a good source of care if your doctor's office is closed or you can't get an appointment. CALL BACK IF: * Signs of dehydration occur (e.g., no urine over 12 hours, very dry mouth, lightheaded,  etc.) * Bloody stools * Constant or  severe abdominal pain * You become worse. CARE ADVICE given per Diarrhea (Adult) guideline. Referrals [facility] - ED

## 2020-05-22 NOTE — Telephone Encounter (Signed)
Several issues to consider here.  Please call his wife.    If there was concern for acute dehydration over the weekend, then ER eval/tx was reasonable at the time.   For clarity going forward, the patient gets to choose his DPR and health care agent(s).   His wife would be the [employer] (assuming he didn't designate another person) but this only applies when the patient is not able to make decisions on his own.    The patient gets to make the decision about hospice/care in general until he is unable to make decisions.  At that point, his wife would have authority (assuming no other person is designated by the patient).    If the patient desires hospice care (ie comfort directed goal with the plan to avoid hospitalization), then hospice referral makes sense.  Otherwise it makes sense to continue with home health.    Is there a way to get the patient on the schedule for a 51min phone/video visit to clarify his goals and plans?

## 2020-05-22 NOTE — Telephone Encounter (Signed)
Patient's wife called stating that her husband had diarrhea over the weekend and she called the nurse line. Patient's wife stated that the nurse told her that they needed to take him to the ER because it sounds like he may be dehydrated and need some IV fluids. Patient's wife stated that it was dramatic for him. Patient's wife stated that the daughters would like for him to have Hospice care. Patient's wife stated that she is in agreement with hospice care if that will help keep him out of the ER. Patient's wife stated that she has 3 stepdaughters that are trying to make decisions for her husband that she does not agree with. Patient's wife wanted to know who is on his DPR and she was advised that she is the only listed. Patient's wife stated that she also is his Healthcare POA. . Patient's wife stated that she does not want her stepdaughters calling the office and being given information and them making the decisions for her husband. [name] stated again she is in a bad situation with her stepdaughters but is doing the best that she thinks is  for her husband.

## 2020-05-23 ENCOUNTER — Telehealth (INDEPENDENT_AMBULATORY_CARE_PROVIDER_SITE_OTHER): Payer: Medicare Other | Admitting: Family Medicine

## 2020-05-23 ENCOUNTER — Encounter: Payer: Self-pay | Admitting: Family Medicine

## 2020-05-23 ENCOUNTER — Encounter: Payer: Self-pay | Admitting: Gastroenterology

## 2020-05-23 ENCOUNTER — Other Ambulatory Visit: Payer: Self-pay

## 2020-05-23 ENCOUNTER — Telehealth: Payer: Self-pay

## 2020-05-23 DIAGNOSIS — M978XXD Periprosthetic fracture around other internal prosthetic joint, subsequent encounter: Secondary | ICD-10-CM

## 2020-05-23 DIAGNOSIS — N400 Enlarged prostate without lower urinary tract symptoms: Secondary | ICD-10-CM | POA: Diagnosis not present

## 2020-05-23 DIAGNOSIS — Z96649 Presence of unspecified artificial hip joint: Secondary | ICD-10-CM

## 2020-05-23 DIAGNOSIS — G4700 Insomnia, unspecified: Secondary | ICD-10-CM | POA: Diagnosis not present

## 2020-05-23 DIAGNOSIS — Z7189 Other specified counseling: Secondary | ICD-10-CM

## 2020-05-23 MED ORDER — TAMSULOSIN HCL 0.4 MG PO CAPS: 0.8000 mg | ORAL_CAPSULE | Freq: Every day | ORAL | 3 refills | Status: DC

## 2020-05-23 NOTE — Telephone Encounter (Signed)
Video visit was scheduled with pt and his wife today with you to discuss home care/hospice and what the pt wants the plan of care to be going forward.

## 2020-05-23 NOTE — Telephone Encounter (Signed)
Contacted [name] Small who reports pt ate some breakfast and drank some coffee this morning and is feeling some better. Pt no longer has diarrhea and has had a BM but does have a raw spot on his buttocks which she makes sure is cleaned after a BM and she applies desitin cream too. She reports urine no longer has an odor and is clear and he is voiding about 853mL now. He is no longer dehydrated.  She reports her step daughter is the one that is trying to get pt into hospice and she is not sure why and dose not believe the pt should go to hospice. She believes pt needs to stay home and receive home care and PT/OT. Advised this should be discuss with her, the pt, and PCP. Made video visit for this afternoon. [name] Small was appreciative and verbalized understanding.

## 2020-05-23 NOTE — Progress Notes (Signed)
Interactive audio and video telecommunications were attempted between this provider and patient, however failed, due to patient having technical difficulties OR patient did not have access to video capability.  We continued and completed visit with audio only.   Virtual Visit via Telephone Note  I connected with patient on 05/25/20  at 12:33 AM  by telephone and verified that I am speaking with the correct person using two identifiers.  Location of patient:  Home.  Location of MD: [facility] Name of referring provider (if blank then none associated): Names per persons and role in encounter:  MD: Earlyne Iba, Patient: name listed above, wife also present on phone call.   I discussed the limitations, risks, security and privacy concerns of performing an evaluation and management service by telephone and the availability of in person appointments. I also discussed with the patient that there may be a patient responsible charge related to this service. The patient expressed understanding and agreed to proceed.  CC: Follow-up.  History of Present Illness:   Previous hospitalization discussed with patient.  He was in the ER recently.  He didn't want to go to ER and he wants to avoid ER/inpatient tx if at all possible.    We talked about his decision-making capacity.  He understands recent hospitalization and his current status.  He can make his own decisions at this point.  He wants his wife designated if patient were incapacitated.  He is putting up with aches and pains.  He doesn't want to do hospice at this point.  Discussed.  He is needing urinary cath about every 8 hours.  880mL x3 = 2451mL total in the last 24 hours.  He has urge to urinate to void.   He has been on flomax recently.  Needs refill, done at visit.   Urine is light yellow and clearing now.   No rash, has tolerated flomax.  I will update urology and ask for their input.    He does have some irritation on his back side  that is improved with desitin.    History of hip fracture.  PT came to his home yesterday.  The goal is to get him out of his chair.  OT is going to come to also home also.  He has orthopedic follow-up pending.  Cr and Hgb improved on recent recheck.  Discussed with patient.  He isn't sleeping well but he is already taking melatonin, his days and nights are mixed up.  We talked about trying to reset his sleep cycle.    Drinking water, decaf tea, and diet/caffeine free ginger ale.  Wife ordered pedialyte- discussed.  He isn't lightheaded on standing.   No black stools.  Appetite is good.    Observations/Objective:  nad Speech wnl  Assessment and Plan:  He is requiring intermittent catheterization and I will ask for urology input.  I sent a refill on his Flomax in the meantime.  He is not lightheaded.  Routine cautions given on the medication.  Discussed staying well-hydrated.  Fortunately his recent creatinine and hemoglobin are both improved on recent checks.  His sleep cycle is disrupted but he is going to try to reset that.  History of hip fracture.  He has orthopedic follow-up pending.  He has OT and PT come to the home.  I do not think it makes sense to refer the patient to hospice at this point.  The patient does not want hospice referral right now and he can  make his own decisions.  He clearly designates his wife as his surrogate decision-maker if he were incapacitated.  Follow Up Instructions: see above.     I discussed the assessment and treatment plan with the patient. The patient was provided an opportunity to ask questions and all were answered. The patient agreed with the plan and demonstrated an understanding of the instructions.   The patient was advised to call back or seek an in-person evaluation if the symptoms worsen or if the condition fails to improve as anticipated.  I provided 22 minutes of non-face-to-face time during this encounter.  [name], MD

## 2020-05-23 NOTE — Telephone Encounter (Signed)
Please give the order.  Thanks.   

## 2020-05-23 NOTE — Telephone Encounter (Signed)
[place] with [facility] left v/m requesting permission to accept [facility] PT orders also from Dr Elvera Lennox ortho that recently did surgery on femur fx.

## 2020-05-23 NOTE — Telephone Encounter (Signed)
[name] Small with [facility] left v/m that pts daughter has requested hospice evaluation due to pt declining. If ordered hospice will go out and access pt to see if hospice is appropriate; Does Dr Damita Dunnings want to be attending or would he like for Hospice to take over pts care if pt is enrolled in hospice. Pt has my chart video visit today at 3:30 to discuss hospice and homecare conversation. FYI to Dr Damita Dunnings.

## 2020-05-24 NOTE — Telephone Encounter (Signed)
Noted.  See office visit.  I thank all involved.

## 2020-05-24 NOTE — Telephone Encounter (Signed)
I will put pertinent details in the office visit note.  To summarize recent events I talked with patient and wife yesterday and we did not make a referral to hospice at this point.  Please update Ebony Hail at hospice about that.  Thanks.

## 2020-05-25 DIAGNOSIS — Z66 Do not resuscitate: Secondary | ICD-10-CM | POA: Insufficient documentation

## 2020-05-25 DIAGNOSIS — G4700 Insomnia, unspecified: Secondary | ICD-10-CM | POA: Insufficient documentation

## 2020-05-25 NOTE — Assessment & Plan Note (Signed)
He is requiring intermittent catheterization and I will ask for urology input.  I sent a refill on his Flomax in the meantime.  He is not lightheaded.  Routine cautions given on the medication.  Discussed staying well-hydrated.  Fortunately his recent creatinine and hemoglobin are both improved on recent checks.

## 2020-05-25 NOTE — Patient Outreach (Signed)
[facility]) Care Management  05/25/2020  [name] Small October 17, 1928 [id number]   New referral for dehydration after a hip fracture and repair.  MD office does Transition of care. Placed call to patient. Patient identified himself and gave me permission to speak with his wife.  Mrs. Teaney reports to me that patient is doing some better. Report no bleeding at this time. States iron supplement post op was cauing diarrhea and lead to dehydration . Wife states patient has not walked since hip replacement. Reports active with home health PT, RN ( [facility]) [facility] for bathing assistance 2 times per week.   Wife reports she is managing medication without problems.  Wife has agreed to telephone follow up. Reports she is taking things day by day.    PLAN: will plan telephone follow up in 1 week. Will mail new patient packet and successful outreach letter.  Tomasa Rand, RN, BSN, CEN [facility] [employer] [phone]

## 2020-05-25 NOTE — Telephone Encounter (Signed)
Ebony Hail at [facility] notified.

## 2020-05-25 NOTE — Assessment & Plan Note (Signed)
I do not think it makes sense to refer the patient to hospice at this point.  The patient does not want hospice referral right now and he can make his own decisions.  He clearly designates his wife as his surrogate decision-maker if he were incapacitated.

## 2020-05-25 NOTE — Assessment & Plan Note (Signed)
  His sleep cycle is disrupted but he is going to try to reset that.

## 2020-05-25 NOTE — Assessment & Plan Note (Signed)
  History of.  He has orthopedic follow-up pending.  He has OT and PT come to the home.

## 2020-05-26 ENCOUNTER — Other Ambulatory Visit: Payer: Self-pay

## 2020-05-26 ENCOUNTER — Encounter
Payer: Medicare Other | Attending: Physical Medicine and Rehabilitation | Admitting: Physical Medicine and Rehabilitation

## 2020-05-26 ENCOUNTER — Encounter: Payer: Self-pay | Admitting: Physical Medicine and Rehabilitation

## 2020-05-26 VITALS — BP 124/57 | HR 63 | Ht 74.0 in | Wt 203.0 lb

## 2020-05-26 DIAGNOSIS — S7222XA Displaced subtrochanteric fracture of left femur, initial encounter for closed fracture: Secondary | ICD-10-CM

## 2020-05-26 MED ORDER — TRAMADOL HCL 50 MG PO TABS: 50.0000 mg | ORAL_TABLET | Freq: Four times a day (QID) | ORAL | 0 refills | Status: DC

## 2020-05-26 MED ORDER — HYDROCODONE-ACETAMINOPHEN 5-325 MG PO TABS: 1.0000 | ORAL_TABLET | Freq: Four times a day (QID) | ORAL | 0 refills | Status: DC | PRN

## 2020-05-26 NOTE — Progress Notes (Signed)
Subjective:    Patient ID: [name] Small, male    DOB: Aug 25, 1928, 86 y.o.   MRN: [id number]  HPI  [name] Small is an 86 year old man with PMH of CAD, COPD on home O2, DM 2, HTN, CKD IIIb, HFpEF, peripheral vascular disease, bladder cancer, who presents for hospital follow-up by phone after CIR admission following a closed left subtrochanteric femur fracture. His pain is currently 3/10. Phone visit is being conducted because he is unable to transport to clinic today due to severely impaired mobility.  His wife provides most history. Following CIR admission, he had to return to the ED on 7/10 due to coffee-ground emesis. I have personally reviewed this note.   Wounds are healing well.   Continues to be incontinent of urine and stool.  All medications reviewed. He requires refills of oxycodone and Tramadol.  He has appropriate follow-ups scheduled in a few weeks at which point his wife hopes his mobility will improve.   Pain Inventory Average Pain 3 Pain Right Now 3 My pain is comes and goes  In the last 24 hours, has pain interfered with the following? General activity unable to determine Relation with others unable to determine Enjoyment of life unable to determine What TIME of day is your pain at its worst? evening Sleep (in general) Fair  Pain is worse with: walking, sitting, standing and some activites Pain improves with: rest and medication Relief from Meds: 5  Mobility walk with assistance how many minutes can you walk? unable to put weight on his left leg ability to climb steps?  no do you drive?  no use a wheelchair needs help with transfers  Function I need assistance with the following:  dressing, bathing, toileting, meal prep, household duties and shopping  Neuro/Psych bladder control problems weakness numbness trouble walking  Prior Studies Any changes since last visit?  no  Physicians involved in your care Any changes since last visit?  yes Primary  care [facility]   Family History  Problem Relation Age of Onset  . Diabetes Mother   . Heart disease Brother   . Heart disease Brother   . Heart disease Brother   . Heart disease Brother   . Heart disease Brother   . Heart disease Brother   . Lung cancer Brother    Social History   Socioeconomic History  . Marital status: Married    Spouse name: Not on file  . Number of children: 4  . Years of education: Not on file  . Highest education level: Not on file  Occupational History  . Occupation: retired     Comment: [employer]	[place].  Tobacco Use  . Smoking status: Former Smoker    Packs/day: 2.00    Years: 22.00    Pack years: 44.00    Types: Cigarettes    Quit date: 11/11/1968    Years since quitting: 51.5  . Smokeless tobacco: Never Used  Vaping Use  . Vaping Use: Never used  Substance and Sexual Activity  . Alcohol use: No    Comment: occassional- 10/17/16- none  in 4 months- "too sick"  . Drug use: No  . Sexual activity: Not Currently  Other Topics Concern  . Not on file  Social History Narrative   Retired Event organiser.   Active with golf.   [place] vet.  [place] [phone].     Social Determinants of Health   Financial Resource Strain:   . Difficulty  of Paying Living Expenses:   Food Insecurity:   . Worried About Charity fundraiser in the Last Year:   . Arboriculturist in the Last Year:   Transportation Needs:   . Film/video editor (Medical):   Marland Kitchen Lack of Transportation (Non-Medical):   Physical Activity:   . Days of Exercise per Week:   . Minutes of Exercise per Session:   Stress:   . Feeling of Stress :   Social Connections:   . Frequency of Communication with Friends and Family:   . Frequency of Social Gatherings with Friends and Family:   . Attends Religious Services:   . Active Member of Clubs or Organizations:   . Attends Archivist Meetings:   Marland Kitchen Marital Status:    Past Surgical  History:  Procedure Laterality Date  . AMPUTATION Left 03/27/2016   Procedure: partial first AMPUTATION RAY; LEFT;  Surgeon: Trula Slade, DPM;  Location: [place];  Service: Podiatry;  Laterality: Left;  . AMPUTATION Left 06/12/2016   Procedure: TRANSMETATARSAL AMPUTATION LEFT FOOT;  Surgeon: Newt Minion, MD;  Location: [place];  Service: Orthopedics;  Laterality: Left;  . APPENDECTOMY  1941  . BIOPSY  05/16/2020   Procedure: BIOPSY;  Surgeon: Lavena Bullion, DO;  [facility];  Service: Gastroenterology;;  . CARDIAC CATHETERIZATION  04-24-11/  DR Rogue Jury ARIDA   MILD NONOBSTRUCTIVE CAD, NORMA CARDIAC OUTPUT  . CARDIOVASCULAR STRESS TEST  2007  . CATARACT EXTRACTION W/ INTRAOCULAR LENS  IMPLANT, BILATERAL Bilateral ~ 2010  . CYSTOSCOPY  12/25/2011   Procedure: CYSTOSCOPY;  Surgeon: Molli Hazard, MD;  Location[facility];  Service: Urology;  Laterality: N/A;  needs intubation and to be paralyzed   . CYSTOSCOPY W/ RETROGRADES  10/30/2011   Procedure: CYSTOSCOPY WITH RETROGRADE PYELOGRAM;  Surgeon: Molli Hazard, MD;  Location: [facility];  Service: Urology;  Laterality: Bilateral;  CYSTOSCOPY POSS TURBT BILATERAL RETROGRADE PYLEOGRAM   . CYSTOSCOPY W/ RETROGRADES  11/30/2012   Procedure: CYSTOSCOPY WITH RETROGRADE PYELOGRAM;  Surgeon: Molli Hazard, MD;  Location: [facility];  Service: Urology;  Laterality: Bilateral;  Flexible cystoscopy.  . CYSTOSCOPY WITH BIOPSY  11/30/2012   Procedure: CYSTOSCOPY WITH BIOPSY;  Surgeon: Molli Hazard, MD;  Location: [facility];  Service: Urology;  Laterality: N/A;  . ESOPHAGOGASTRODUODENOSCOPY (EGD) WITH PROPOFOL N/A 05/16/2020   Procedure: ESOPHAGOGASTRODUODENOSCOPY (EGD) WITH PROPOFOL;  Surgeon: Lavena Bullion, DO;  [facility];  Service: Gastroenterology;  Laterality: N/A;  . INCISION AND DRAINAGE FOOT Left ~ 2005   LEFT FOOT DUE TO  INFECTION FROM  NAIL PUNCTURE INJURY  . LUMBAR LAMINECTOMY/DECOMPRESSION MICRODISCECTOMY N/A 10/18/2016   Procedure: LEFT AND CENTRAL L4-5 LUMBAR LAMINECTOMY WITH RESECTION OF SYNOVIAL CYST;  Surgeon: Jessy Oto, MD;  Location: [place];  Service: Orthopedics;  Laterality: N/A;  . ORIF FEMUR FRACTURE Left 04/19/2020   Procedure: OPEN REDUCTION INTERNAL FIXATION FEMORAL SHAFT FRACTURE;  Surgeon: Shona Needles, MD;  Location: [place];  Service: Orthopedics;  Laterality: Left;  . PENILE PROSTHESIS IMPLANT  1990s  . PERIPHERAL VASCULAR CATHETERIZATION N/A 02/14/2016   Procedure: Abdominal Aortogram w/Lower Extremity;  Surgeon: Wellington Hampshire, MD;  Location: [place] [facility];  Service: Cardiovascular;  Laterality: N/A;  . PERIPHERAL VASCULAR CATHETERIZATION Left 02/28/2016   Procedure: Peripheral Vascular Balloon Angioplasty;  Surgeon: Wellington Hampshire, MD;  Location: [place] [facility];  Service: Cardiovascular;  Laterality:  Left;  left peroneal and popliteal artery  . [place]  . TOTAL HIP ARTHROPLASTY Left 12/27/2016   Procedure: LEFT TOTAL HIP ARTHROPLASTY ANTERIOR APPROACH;  Surgeon: Mcarthur Rossetti, MD;  [facility];  Service: Orthopedics;  Laterality: Left;  . TRANSURETHRAL RESECTION OF BLADDER TUMOR  10/30/2011   Procedure: TRANSURETHRAL RESECTION OF BLADDER TUMOR (TURBT);  Surgeon: Molli Hazard, MD;  Location: [facility];  Service: Urology;  Laterality: N/A;  . TRANSURETHRAL RESECTION OF BLADDER TUMOR  12/25/2011   Procedure: TRANSURETHRAL RESECTION OF BLADDER TUMOR (TURBT);  Surgeon: Molli Hazard, MD;  Location: [facility];  Service: Urology;  Laterality: N/A;  need long gyrus instruments   . VASECTOMY  1990s   Past Medical History:  Diagnosis Date  . (HFpEF) heart failure with preserved ejection fraction ([place])   . Arthritis   . Back pain    s/p lumbar injection 2014  . BENIGN PROSTATIC HYPERTROPHY  05/21/2007  . Bladder cancer ([place]) 10/2011  . CAD (coronary artery disease)    nonobstructive by cath 6/12:  mid LAD 30%, proximal obtuse marginal-2 30%, proximal RCA 20%, mid RCA 30-40%.  He had normal cardiac output and mildly elevated filling pressures but no significant pulmonary hypertension;   Echocardiogram in May 2012 demonstrated EF 50-55% and left atrial enlargement   . Cellulitis of left foot    Hospitalized in 2006  . Chronic kidney disease (CKD), stage III (moderate) 12/04/2007   FOLLOWED BY DR PATEL  . Chronic pain of lower extremity   . Complication of anesthesia    1 time bladder cancer surgery- 2012 , oxygen satruratioon dropped had to stay overnight, no problems since then.  Marland Kitchen COPD (chronic obstructive pulmonary disease) ([place])   . DIABETES MELLITUS, TYPE II 05/21/2007  . DIVERTICULOSIS, COLON 05/21/2007   pt denies  . Dyspnea    with exertion, patient mointors saturation  . Fatigue AGE-RELATED  . Gross hematuria    pt denies  . HYPERTENSION 05/21/2007  . Impaired hearing BILATERAL HEARING AIDS  . On home oxygen therapy    as needed  . PAD (peripheral artery disease) ([place])    a. 02/2016 L foot nonhealing ulcer-->Periph angio: L pop 100 w/ reconstitution via extensive vollats to the prox-mid peroneal (only patent vessel BK)-->PTA of L Pop and peroneal; b. Ongoing LE ischemia 5 & 06/2016 req amputation of toes on L foot.  . Pneumonia    Hx  of   . Psoriasis ELBOWS  . Psoriasis   . PSORIASIS, SCALP 10/25/2008   BP (!) 124/57 Comment: reported  Pulse 63   Ht 6\' 2"  (1.88 m) Comment: last recorded  Wt 203 lb (92.1 kg) Comment: last recorded  BMI 26.06 kg/m   Opioid Risk Score:   Fall Risk Score:  `1  Depression screen PHQ 2/9  Depression screen [facility] 2/9 02/19/2016 02/19/2016  Decreased Interest 0 0  Down, Depressed, Hopeless 0 0  PHQ - 2 Score 0 0  Some recent data might be hidden    Review of Systems  Constitutional: Negative.   HENT: Negative.   Eyes:  Negative.   Respiratory: Negative.   Cardiovascular: Negative.   Gastrointestinal: Negative.   Endocrine: Negative.   Genitourinary:       Being in and out cath, feels urge but cannot urinate on his own.  Has appt with [facility] 06/05/20  Musculoskeletal: Positive for gait problem.       Unable to  bear weight on his left leg. Only had one PT visit and so he is not walking at all  Skin: Positive for wound.       Left buttocks wife using desitin  Allergic/Immunologic: Negative.   Neurological: Positive for weakness and numbness.       Tingling, has neuropathy dx  Hematological: Negative.   Psychiatric/Behavioral: Negative.   All other systems reviewed and are negative.      Objective:   Physical Exam Not performed as visit was conducted via phone.      Assessment & Plan:  [name] Small is an 86 year old man with PMH of CAD, COPD on home O2, DM type 2, HTN, CKD stage IIIb, HFpEF, PVD, bladder cancer, who presents for hospital follow-up after CIR admission for left femoral fracture following a fall in early June.  -Post admission, he went to the ED for coffee ground emesis. Note reviewed.  -All medications reviewed with wife. He is taking them as prescribed. He needs refills of his Oxycodone and Tramadol. He is taking them appropriately. PDMP reviewed.   -Continues to be incontinent of bowel and bladder.  -Wounds stable.  -Receiving home health services.  10 minutes of patient care time were spent during this visit. All questions were encouraged and answered.   All questions answered. RTC in 6 weeks.

## 2020-05-26 NOTE — Patient Outreach (Signed)
[name] Small) Care Management  05/26/2020  [name] Small 05/01/1928 [id number]    Care coordination:  In basket message from office to call wife.  Placed call to wife and inquire about needs. Wife reports she feels like patient needs more PT. Reports patient was getting 5.5 days of PT/OT per week at inpatient rehab.  Now wife states patient has only had 1 treatment. Reports patient is unable to weigh bear on leg due to fracture. Wife reports patient has been doing home exercises and was provided bands which he is using.  PLAN: will contact PT Marjory Lies) with [facility] and inquire about treatment plan and if possible to increase number of days.    Placed call to [facility] and spoke with supervisor Darlene about wifes concern for need of increased PT hours. I was informed that patient would get PT 2 x week for 4 weeks and then a reeval.  OT has completed eval and is waiting for recommendations. Currently patient has PCS services for 16 hours per week.  I placed call back to wife and informed of my findings and provided reassurance.  Patient is currently receiving therapy on Monday and Thursday.  Wife voiced understanding.   Tomasa Rand, RN, BSN, CEN [facility] [employer] [phone]

## 2020-05-31 DIAGNOSIS — E1122 Type 2 diabetes mellitus with diabetic chronic kidney disease: Secondary | ICD-10-CM

## 2020-05-31 DIAGNOSIS — K2100 Gastro-esophageal reflux disease with esophagitis, without bleeding: Secondary | ICD-10-CM

## 2020-05-31 DIAGNOSIS — R338 Other retention of urine: Secondary | ICD-10-CM

## 2020-05-31 DIAGNOSIS — E1142 Type 2 diabetes mellitus with diabetic polyneuropathy: Secondary | ICD-10-CM

## 2020-05-31 DIAGNOSIS — M9702XD Periprosthetic fracture around internal prosthetic left hip joint, subsequent encounter: Secondary | ICD-10-CM | POA: Diagnosis not present

## 2020-05-31 DIAGNOSIS — I5032 Chronic diastolic (congestive) heart failure: Secondary | ICD-10-CM

## 2020-05-31 DIAGNOSIS — Z993 Dependence on wheelchair: Secondary | ICD-10-CM

## 2020-05-31 DIAGNOSIS — E1151 Type 2 diabetes mellitus with diabetic peripheral angiopathy without gangrene: Secondary | ICD-10-CM

## 2020-05-31 DIAGNOSIS — D631 Anemia in chronic kidney disease: Secondary | ICD-10-CM

## 2020-05-31 DIAGNOSIS — M96662 Fracture of femur following insertion of orthopedic implant, joint prosthesis, or bone plate, left leg: Secondary | ICD-10-CM

## 2020-05-31 DIAGNOSIS — N184 Chronic kidney disease, stage 4 (severe): Secondary | ICD-10-CM

## 2020-05-31 DIAGNOSIS — Z89432 Acquired absence of left foot: Secondary | ICD-10-CM

## 2020-05-31 DIAGNOSIS — J449 Chronic obstructive pulmonary disease, unspecified: Secondary | ICD-10-CM

## 2020-05-31 DIAGNOSIS — M1990 Unspecified osteoarthritis, unspecified site: Secondary | ICD-10-CM

## 2020-05-31 DIAGNOSIS — Z9181 History of falling: Secondary | ICD-10-CM

## 2020-05-31 DIAGNOSIS — Z9981 Dependence on supplemental oxygen: Secondary | ICD-10-CM

## 2020-05-31 DIAGNOSIS — F4320 Adjustment disorder, unspecified: Secondary | ICD-10-CM

## 2020-05-31 DIAGNOSIS — E785 Hyperlipidemia, unspecified: Secondary | ICD-10-CM

## 2020-05-31 DIAGNOSIS — Z8701 Personal history of pneumonia (recurrent): Secondary | ICD-10-CM

## 2020-05-31 DIAGNOSIS — Z7951 Long term (current) use of inhaled steroids: Secondary | ICD-10-CM

## 2020-05-31 DIAGNOSIS — F419 Anxiety disorder, unspecified: Secondary | ICD-10-CM

## 2020-05-31 DIAGNOSIS — S7222XD Displaced subtrochanteric fracture of left femur, subsequent encounter for closed fracture with routine healing: Secondary | ICD-10-CM | POA: Diagnosis not present

## 2020-05-31 DIAGNOSIS — K2210 Ulcer of esophagus without bleeding: Secondary | ICD-10-CM | POA: Diagnosis not present

## 2020-05-31 DIAGNOSIS — N390 Urinary tract infection, site not specified: Secondary | ICD-10-CM

## 2020-05-31 DIAGNOSIS — Z96642 Presence of left artificial hip joint: Secondary | ICD-10-CM

## 2020-05-31 DIAGNOSIS — I4891 Unspecified atrial fibrillation: Secondary | ICD-10-CM

## 2020-05-31 DIAGNOSIS — N401 Enlarged prostate with lower urinary tract symptoms: Secondary | ICD-10-CM

## 2020-05-31 DIAGNOSIS — K5730 Diverticulosis of large intestine without perforation or abscess without bleeding: Secondary | ICD-10-CM

## 2020-05-31 DIAGNOSIS — M48062 Spinal stenosis, lumbar region with neurogenic claudication: Secondary | ICD-10-CM

## 2020-05-31 DIAGNOSIS — E1165 Type 2 diabetes mellitus with hyperglycemia: Secondary | ICD-10-CM

## 2020-05-31 DIAGNOSIS — Z794 Long term (current) use of insulin: Secondary | ICD-10-CM

## 2020-05-31 DIAGNOSIS — Z87891 Personal history of nicotine dependence: Secondary | ICD-10-CM

## 2020-05-31 DIAGNOSIS — K5900 Constipation, unspecified: Secondary | ICD-10-CM

## 2020-05-31 DIAGNOSIS — I130 Hypertensive heart and chronic kidney disease with heart failure and stage 1 through stage 4 chronic kidney disease, or unspecified chronic kidney disease: Secondary | ICD-10-CM | POA: Diagnosis not present

## 2020-05-31 DIAGNOSIS — I2510 Atherosclerotic heart disease of native coronary artery without angina pectoris: Secondary | ICD-10-CM

## 2020-05-31 DIAGNOSIS — Z8551 Personal history of malignant neoplasm of bladder: Secondary | ICD-10-CM

## 2020-06-01 ENCOUNTER — Encounter: Payer: Self-pay | Admitting: Family Medicine

## 2020-06-01 ENCOUNTER — Other Ambulatory Visit: Payer: Self-pay

## 2020-06-02 NOTE — Patient Outreach (Signed)
[place]) Care Management  06/02/2020  CORWYN VORA 1928/07/12 [id number]    Telephone assessment:  Placed call to patient and spoke with Mrs.Kurtenbach who states patient is doing some better. Reports PT 2 times per week and OT 1 time per week.  Wife states she has seen some slight improvement. Mentions that patient will see ortho next week and she is hoping patient will be able to weight bear.  Wife denies any bleeding.  Report patient is eating better.   PLAN: will follow up after MD visit next week. Encouraged wife to call sooner if needed.  Tomasa Rand, RN, BSN, CEN [facility] [employer] [phone]

## 2020-06-05 DIAGNOSIS — Z8551 Personal history of malignant neoplasm of bladder: Secondary | ICD-10-CM | POA: Diagnosis not present

## 2020-06-05 DIAGNOSIS — R310 Gross hematuria: Secondary | ICD-10-CM | POA: Diagnosis not present

## 2020-06-05 DIAGNOSIS — M9702XD Periprosthetic fracture around internal prosthetic left hip joint, subsequent encounter: Secondary | ICD-10-CM | POA: Diagnosis not present

## 2020-06-05 DIAGNOSIS — R338 Other retention of urine: Secondary | ICD-10-CM | POA: Diagnosis not present

## 2020-06-08 ENCOUNTER — Other Ambulatory Visit: Payer: Self-pay

## 2020-06-08 ENCOUNTER — Other Ambulatory Visit: Payer: Self-pay | Admitting: Family Medicine

## 2020-06-08 ENCOUNTER — Telehealth: Payer: Self-pay | Admitting: Family Medicine

## 2020-06-08 DIAGNOSIS — R8290 Unspecified abnormal findings in urine: Secondary | ICD-10-CM

## 2020-06-08 NOTE — Telephone Encounter (Signed)
Multiple causes could contribute to sugar elevation.  I would inc PO water, continue checking sugar and update me if persistently elevated.  Elevated sugar can cause a change in urine odor.  If fever or burning with urination, then let me know.    Reasonable to drop off sterile urine sample.  I put in the order.  Needs to use sterile container.  Thanks.

## 2020-06-08 NOTE — Telephone Encounter (Signed)
Patient's wife called in stating these last two days she has noticed that her husband's urine has an odor. Suspecting possible UTI. Patient's wife also noticed that her husband blood sugar was high today. States 300. Wife is wondering if stress can cause a high blood sugar. Please advise as she would like advice and see if she can drop off a sample.

## 2020-06-09 ENCOUNTER — Other Ambulatory Visit (INDEPENDENT_AMBULATORY_CARE_PROVIDER_SITE_OTHER): Payer: Medicare Other

## 2020-06-09 ENCOUNTER — Other Ambulatory Visit: Payer: Self-pay

## 2020-06-09 DIAGNOSIS — R8290 Unspecified abnormal findings in urine: Secondary | ICD-10-CM | POA: Diagnosis not present

## 2020-06-09 DIAGNOSIS — I509 Heart failure, unspecified: Secondary | ICD-10-CM | POA: Diagnosis not present

## 2020-06-09 DIAGNOSIS — R062 Wheezing: Secondary | ICD-10-CM | POA: Diagnosis not present

## 2020-06-09 DIAGNOSIS — J449 Chronic obstructive pulmonary disease, unspecified: Secondary | ICD-10-CM | POA: Diagnosis not present

## 2020-06-09 DIAGNOSIS — J9600 Acute respiratory failure, unspecified whether with hypoxia or hypercapnia: Secondary | ICD-10-CM | POA: Diagnosis not present

## 2020-06-09 LAB — URINALYSIS, ROUTINE W REFLEX MICROSCOPIC
Bilirubin Urine: NEGATIVE
Ketones, ur: NEGATIVE
Nitrite: NEGATIVE
Specific Gravity, Urine: 1.02 (ref 1.000–1.030)
Total Protein, Urine: 30 — AB
Urine Glucose: 250 — AB
Urobilinogen, UA: 0.2 (ref 0.0–1.0)
pH: 5.5 (ref 5.0–8.0)

## 2020-06-09 NOTE — Patient Outreach (Signed)
[facility]) Care Management  [place]  06/09/2020   [name] Small [birth year] [id number]  Subjective: Information gather from wife for completion of assessments.  Wife reports bath aid/ caregiver from [facility] did not show up yesterday or today due to covid testing and exposure.  Wife reports patient saw ortho this week and he is now able to weight bear.  Wife reports patient is doing home exercises and she can see improvements.  Wife states CBg range from 120-300. Wife reports patient is taking all medications as prescribed. Wife reports patient uses bedside commode for bowel movements. Wife is in and out urinary catheterizing patient 2-3 times per day. Reports patient is starting to be able to urinate on his own. Wife reports patient uses an IP[facility] from [facility] for BP, weight, cbg, pulse ox monitoring.   Objective: Vitals:   06/09/20 1406  Weight: (!) 210 lb (95.3 kg)  Height: 1.88 m (6' 2" )   Encounter Medications:  Outpatient Encounter Medications as of 06/09/2020  Medication Sig Note  . acetaminophen (TYLENOL) 325 MG tablet Take 1-2 tablets (325-650 mg total) by mouth every 4 (four) hours as needed for mild pain.   Marland Kitchen Apremilast 30 MG TABS Take 1 tablet by mouth daily.   Marland Kitchen atorvastatin (LIPITOR) 10 MG tablet Take 1 tablet (10 mg total) by mouth daily.   . budesonide-formoterol (SYMBICORT) 160-4.5 MCG/ACT inhaler Inhale 2 puffs into the lungs 2 (two) times daily as needed (shortness of breath).   . clobetasol (TEMOVATE) 0.05 % external solution Apply 1 application topically daily as needed (rash).    Marland Kitchen escitalopram (LEXAPRO) 5 MG tablet Take 1 tablet (5 mg total) by mouth daily.   . insulin glargine (LANTUS) 100 UNIT/ML Solostar Pen Inject 10 Units into the skin 2 (two) times daily.   . Multiple Vitamin (MULTIVITAMIN WITH MINERALS) TABS tablet Take 1 tablet by mouth daily.   Marland Kitchen omeprazole (PRILOSEC) 40 MG capsule Take 1 capsule (40 mg total) by mouth daily.   .  tamsulosin (FLOMAX) 0.4 MG CAPS capsule Take 2 capsules (0.8 mg total) by mouth daily after supper.   . traMADol (ULTRAM) 50 MG tablet Take 1 tablet (50 mg total) by mouth every 6 (six) hours. 05/26/2020: Fill date 05/12/20 #20 today #3  . umeclidinium bromide (INCRUSE ELLIPTA) 62.5 MCG/INH AEPB Inhale 1 puff into the lungs daily.   . calcium carbonate (OS-CAL - DOSED IN MG OF ELEMENTAL CALCIUM) 1250 (500 Ca) MG tablet Take 1 tablet (500 mg of elemental calcium total) by mouth daily with breakfast. (Patient not taking: Reported on 06/09/2020)   . docusate sodium (COLACE) 100 MG capsule Take 1 capsule (100 mg total) by mouth 3 (three) times daily. (Patient not taking: Reported on 06/09/2020)   . HYDROcodone-acetaminophen (NORCO/VICODIN) 5-325 MG tablet Take 1 tablet by mouth every 6 (six) hours as needed for moderate pain. (Patient not taking: Reported on 06/09/2020) 05/26/2020: Fill date 05/12/20 #30 today #28  . melatonin 5 MG TABS Take 2 tablets (10 mg total) by mouth at bedtime as needed (sleep). (Patient not taking: Reported on 06/09/2020)   . methocarbamol (ROBAXIN) 500 MG tablet Take 1 tablet (500 mg total) by mouth every 6 (six) hours as needed for muscle spasms. (Patient not taking: Reported on 06/09/2020)   . polyethylene glycol (MIRALAX / GLYCOLAX) 17 g packet Take 17 g by mouth daily. (Patient not taking: Reported on 06/09/2020)   . senna (SENOKOT) 8.6 MG TABS tablet Take 2 tablets (17.2  mg total) by mouth at bedtime. (Patient not taking: Reported on 06/09/2020)   . tiotropium (SPIRIVA) 18 MCG inhalation capsule Place 1 capsule (18 mcg total) into inhaler and inhale daily. (Patient not taking: Reported on 06/09/2020)   . [DISCONTINUED] iron polysaccharides (NIFEREX) 150 MG capsule Take 1 capsule (150 mg total) by mouth daily after supper.    No facility-administered encounter medications on file as of 06/09/2020.    Functional Status:  In your present state of health, do you have any difficulty  performing the following activities: 06/09/2020 05/14/2020  Hearing? Y Y  Comment hearing aids -  Vision? N N  Difficulty concentrating or making decisions? N N  Walking or climbing stairs? Y Y  Comment uses whelchair -  Dressing or bathing? Tempie Donning  Comment wife assist with dressing -  Doing errands, shopping? Y N  Comment wife drives to appoimtments -  Conservation officer, nature and eating ? Y -  Using the Toilet? Y -  Comment bed side commode and caths per wife  ( 3 x day) -  In the past six months, have you accidently leaked urine? Y -  Do you have problems with loss of bowel control? Y -  Managing your Medications? Y -  Comment wife manages medications -  Managing your Finances? Y -  Comment wife manages money -  Runner, broadcasting/film/video? Y -  Comment wife manages housekeeping -  Some recent data might be hidden    Fall/Depression Screening: Fall Risk  06/09/2020 05/26/2020 02/19/2016  Falls in the past year? 1 0 No  Number falls in past yr: 1 - -  Injury with Fall? 0 - -  Risk for fall due to : - Impaired balance/gait;Impaired mobility -  Risk for fall due to: Comment - age -   [facility] 2/9 Scores 06/09/2020 05/26/2020 02/19/2016 02/19/2016  PHQ - 2 Score 1 0 0 0    Assessment: (1) out of prilosec (2) has had follow up with ortho and is now able to weight bear. (3) active with PT and OT (4) wife assist with all IADLS and medication management.    Plan:  (1) wife states she will call MD and confirm if patient needs to take medication. (2) encouraged patient to follow MD instructions and call MD for changes in condition. (3) reviewed importance of home health exercises. (4) encouraged wife to ask for assistance from family and friends if needed.  THN CM Care Plan Problem One     Most Recent Value  Care Plan Problem One Patient with recent hip fracture and readmission for dehydration and GI bleed  Role Documenting the Problem One Care Management [place] for  Problem One Active  THN Long Term Goal  Patient and or wife will report no readmissions to the hospital in the next 31 days.   THN Long Term Goal Start Date 05/23/20  Interventions for Problem One Long Term Goal Reviewed current progress. Reviewed current needs for in home care. Reviewed telemonitoring with [facility].   THN CM Short Term Goal #1  Patient and or wife will report improved strength in the next 30 days.   THN CM Short Term Goal #1 Start Date 05/23/20  Interventions for Short Term Goal #1 Encouraged patient to continue to do hoe exericises.   THN CM Short Term Goal #2  Patient and or wife will verbalize understanding signs and symptoms of bleeding in the next 14 days.   THN CM Short  Term Goal #2 Start Date 05/23/20  [facility] CM Short Term Goal #2 Met Date 06/02/20     Next follow up in 1 week.  Tomasa Rand, RN, BSN, CEN [facility] [employer] [phone]

## 2020-06-09 NOTE — Telephone Encounter (Signed)
Spoke with [name] and gave Dr. Josefine Class message.  [name] will bring in a urine specimen for testing.

## 2020-06-09 NOTE — Telephone Encounter (Signed)
[name] Small (DPR signed) left v/m requesting cb about call from 06/08/20; today pts urine is also cloudy. I called [name] Small but was unable to reach pt by phone; no v/m set up.

## 2020-06-11 ENCOUNTER — Other Ambulatory Visit: Payer: Self-pay | Admitting: Family Medicine

## 2020-06-11 DIAGNOSIS — Z96649 Presence of unspecified artificial hip joint: Secondary | ICD-10-CM | POA: Diagnosis not present

## 2020-06-11 DIAGNOSIS — M978XXA Periprosthetic fracture around other internal prosthetic joint, initial encounter: Secondary | ICD-10-CM | POA: Diagnosis not present

## 2020-06-11 LAB — URINE CULTURE
MICRO NUMBER:: 10770300
SPECIMEN QUALITY:: ADEQUATE

## 2020-06-11 MED ORDER — CEPHALEXIN 500 MG PO CAPS
500.0000 mg | ORAL_CAPSULE | Freq: Two times a day (BID) | ORAL | 0 refills | Status: DC
Start: 2020-06-11 — End: 2020-06-28

## 2020-06-11 MED ORDER — OMEPRAZOLE 40 MG PO CPDR: 40.0000 mg | DELAYED_RELEASE_CAPSULE | Freq: Every day | ORAL | 5 refills | Status: DC

## 2020-06-12 ENCOUNTER — Telehealth: Payer: Self-pay | Admitting: *Deleted

## 2020-06-12 ENCOUNTER — Other Ambulatory Visit: Payer: Self-pay | Admitting: Family Medicine

## 2020-06-12 DIAGNOSIS — D649 Anemia, unspecified: Secondary | ICD-10-CM

## 2020-06-12 NOTE — Telephone Encounter (Signed)
Patient's wife left a voicemail stating that she saw on mychart that her husband has an infection and wants to know when Dr. Damita Dunnings is going to send in a script for an antibiotic.  Patient's wife stated that her husband wants to know if Dr. Damita Dunnings can give him something to help him sleep? Pharmacy [facility]

## 2020-06-12 NOTE — Telephone Encounter (Signed)
Wife advised. 

## 2020-06-12 NOTE — Telephone Encounter (Signed)
See ucx note.  I would try to treat the UTI first and then address residual sleep changes after that.

## 2020-06-14 ENCOUNTER — Telehealth: Payer: Self-pay | Admitting: Family Medicine

## 2020-06-14 NOTE — Telephone Encounter (Signed)
Patient's wife called and wanted to

## 2020-06-15 ENCOUNTER — Other Ambulatory Visit (INDEPENDENT_AMBULATORY_CARE_PROVIDER_SITE_OTHER): Payer: Medicare Other

## 2020-06-15 ENCOUNTER — Other Ambulatory Visit: Payer: Self-pay

## 2020-06-15 DIAGNOSIS — D649 Anemia, unspecified: Secondary | ICD-10-CM

## 2020-06-15 LAB — COMPREHENSIVE METABOLIC PANEL
ALT: 9 U/L (ref 0–53)
AST: 13 U/L (ref 0–37)
Albumin: 3.5 g/dL (ref 3.5–5.2)
Alkaline Phosphatase: 77 U/L (ref 39–117)
BUN: 36 mg/dL — ABNORMAL HIGH (ref 6–23)
CO2: 32 mEq/L (ref 19–32)
Calcium: 8.6 mg/dL (ref 8.4–10.5)
Chloride: 99 mEq/L (ref 96–112)
Creatinine, Ser: 1.59 mg/dL — ABNORMAL HIGH (ref 0.40–1.50)
GFR: 41.14 mL/min — ABNORMAL LOW (ref >60.00)
Glucose, Bld: 185 mg/dL — ABNORMAL HIGH (ref 70–99)
Potassium: 4.5 mEq/L (ref 3.5–5.1)
Sodium: 134 mEq/L — ABNORMAL LOW (ref 135–145)
Total Bilirubin: 0.4 mg/dL (ref 0.2–1.2)
Total Protein: 7 g/dL (ref 6.0–8.3)

## 2020-06-15 LAB — CBC WITH DIFFERENTIAL/PLATELET
Basophils Absolute: 0.1 10*3/uL (ref 0.0–0.1)
Basophils Relative: 0.6 % (ref 0.0–3.0)
Eosinophils Absolute: 0.3 10*3/uL (ref 0.0–0.7)
Eosinophils Relative: 2.8 % (ref 0.0–5.0)
HCT: 38.8 % — ABNORMAL LOW (ref 39.0–52.0)
Hemoglobin: 12.7 g/dL — ABNORMAL LOW (ref 13.0–17.0)
Lymphocytes Relative: 16.4 % (ref 12.0–46.0)
Lymphs Abs: 1.6 10*3/uL (ref 0.7–4.0)
MCHC: 32.7 g/dL (ref 30.0–36.0)
MCV: 98.7 fl (ref 78.0–100.0)
Monocytes Absolute: 0.8 10*3/uL (ref 0.1–1.0)
Monocytes Relative: 8.3 % (ref 3.0–12.0)
Neutro Abs: 6.9 10*3/uL (ref 1.4–7.7)
Neutrophils Relative %: 71.9 % (ref 43.0–77.0)
Platelets: 307 10*3/uL (ref 150.0–400.0)
RBC: 3.93 Mil/uL — ABNORMAL LOW (ref 4.22–5.81)
RDW: 14.1 % (ref 11.5–15.5)
WBC: 9.6 10*3/uL (ref 4.0–10.5)

## 2020-06-16 ENCOUNTER — Ambulatory Visit: Payer: Self-pay

## 2020-06-19 ENCOUNTER — Other Ambulatory Visit: Payer: Self-pay

## 2020-06-20 NOTE — Patient Outreach (Signed)
[facility]) Care Management  06/20/2020  [name] Small Apr 04, 1928 [id number]    Telephone assessment:  Placed call to patient and wife reports patient is taking a bath.  Return call to patient and spoke with wife who states patient is making progress.  Reports he is able to stand and take a step with the walker. Family is assisting patient outside via wheelchair.  Wife states she is concerned about the decrease in PT sessions. Reports patient continues to be weak and have decreased strength.   Wife denies any rectal bleeding.  PLAN: I encouraged wife to discuss concerns about PT with home health agency and hoe health agency supervisor.  Reviewed with wife to discussion with orthopedic as well.  Encouraged wife to remind patient of importance of activity and doing home exercises as per PT.   Will follow up with patient and wife in 1 week.  Tomasa Rand, RN, BSN, CEN [facility] [employer] [phone]

## 2020-06-22 DIAGNOSIS — J9600 Acute respiratory failure, unspecified whether with hypoxia or hypercapnia: Secondary | ICD-10-CM | POA: Diagnosis not present

## 2020-06-22 DIAGNOSIS — R062 Wheezing: Secondary | ICD-10-CM | POA: Diagnosis not present

## 2020-06-22 DIAGNOSIS — J449 Chronic obstructive pulmonary disease, unspecified: Secondary | ICD-10-CM | POA: Diagnosis not present

## 2020-06-22 DIAGNOSIS — I509 Heart failure, unspecified: Secondary | ICD-10-CM | POA: Diagnosis not present

## 2020-06-27 ENCOUNTER — Encounter: Payer: Self-pay | Admitting: Family Medicine

## 2020-06-27 ENCOUNTER — Other Ambulatory Visit: Payer: Self-pay

## 2020-06-27 NOTE — Patient Outreach (Signed)
[facility]) Care Management  06/27/2020  [name] Small [birth year] [id number]   Telephone assessment/case closure:  Placed call to patient and spoe with wife. Wife states that patient is much improved.  Reports PT was extended for atleast 1 month.  Wife reports patient is walking better and now using a regular toilet.  Wife reports that overall things are improving.   PLAN: reviewed case closure with wife and she is in agreement.  Reviewed my contact information. Will send a case closure letter to MD and patient. Goals met.  Tomasa Rand, RN, BSN, CEN [facility] [employer] [phone]

## 2020-06-28 ENCOUNTER — Other Ambulatory Visit: Payer: Self-pay | Admitting: Family Medicine

## 2020-06-28 DIAGNOSIS — R300 Dysuria: Secondary | ICD-10-CM

## 2020-06-28 MED ORDER — CEPHALEXIN 500 MG PO CAPS: 500.0000 mg | ORAL_CAPSULE | Freq: Two times a day (BID) | ORAL | 0 refills | Status: DC

## 2020-06-28 NOTE — Telephone Encounter (Signed)
Patient's wife left a voicemail wanting to know what she should do about his urine. See my chart message.

## 2020-06-29 ENCOUNTER — Encounter: Payer: Medicare Other | Admitting: Physical Medicine and Rehabilitation

## 2020-07-03 DIAGNOSIS — M9702XD Periprosthetic fracture around internal prosthetic left hip joint, subsequent encounter: Secondary | ICD-10-CM | POA: Diagnosis not present

## 2020-07-09 ENCOUNTER — Encounter: Payer: Self-pay | Admitting: Family Medicine

## 2020-07-10 ENCOUNTER — Encounter: Payer: Self-pay | Admitting: Family Medicine

## 2020-07-10 DIAGNOSIS — J9600 Acute respiratory failure, unspecified whether with hypoxia or hypercapnia: Secondary | ICD-10-CM | POA: Diagnosis not present

## 2020-07-10 DIAGNOSIS — J449 Chronic obstructive pulmonary disease, unspecified: Secondary | ICD-10-CM | POA: Diagnosis not present

## 2020-07-10 DIAGNOSIS — R062 Wheezing: Secondary | ICD-10-CM | POA: Diagnosis not present

## 2020-07-10 DIAGNOSIS — I509 Heart failure, unspecified: Secondary | ICD-10-CM | POA: Diagnosis not present

## 2020-07-11 ENCOUNTER — Other Ambulatory Visit: Payer: Self-pay | Admitting: Family Medicine

## 2020-07-11 DIAGNOSIS — R300 Dysuria: Secondary | ICD-10-CM

## 2020-07-11 MED ORDER — CEPHALEXIN 500 MG PO CAPS: 500.0000 mg | ORAL_CAPSULE | Freq: Two times a day (BID) | ORAL | 0 refills | Status: DC

## 2020-07-11 NOTE — Telephone Encounter (Signed)
[name] (spouse) call wanting to know if you would call in pt something for UTI.  He is having frequenty cloudy and has oder.  [name] stated she doesn't have any help getting pt into car to get pt here for appointment.   [facility]  Please advise  [facility]/16 She stated they are doing to do a scope pt had bladder cancer  Best number for doris [phone]

## 2020-07-12 ENCOUNTER — Other Ambulatory Visit: Payer: Medicare Other

## 2020-07-12 DIAGNOSIS — R300 Dysuria: Secondary | ICD-10-CM

## 2020-07-12 DIAGNOSIS — Z96649 Presence of unspecified artificial hip joint: Secondary | ICD-10-CM | POA: Diagnosis not present

## 2020-07-12 DIAGNOSIS — M978XXA Periprosthetic fracture around other internal prosthetic joint, initial encounter: Secondary | ICD-10-CM | POA: Diagnosis not present

## 2020-07-14 LAB — URINE CULTURE
MICRO NUMBER:: 10899428
SPECIMEN QUALITY:: ADEQUATE

## 2020-07-17 ENCOUNTER — Other Ambulatory Visit: Payer: Self-pay | Admitting: Family Medicine

## 2020-07-17 DIAGNOSIS — E1142 Type 2 diabetes mellitus with diabetic polyneuropathy: Secondary | ICD-10-CM

## 2020-07-17 DIAGNOSIS — Z794 Long term (current) use of insulin: Secondary | ICD-10-CM

## 2020-07-18 ENCOUNTER — Encounter: Payer: Self-pay | Admitting: Family Medicine

## 2020-07-19 NOTE — Telephone Encounter (Signed)
Is there a way to get labs drawn at home for this patient?  If not, can he get labs done at [facility] (or here at the car) on the day he has his appointment with urology?    Would need cbc bmet and a1c.

## 2020-07-20 ENCOUNTER — Other Ambulatory Visit: Payer: Self-pay

## 2020-07-20 ENCOUNTER — Other Ambulatory Visit (INDEPENDENT_AMBULATORY_CARE_PROVIDER_SITE_OTHER): Payer: Medicare Other

## 2020-07-20 DIAGNOSIS — E1142 Type 2 diabetes mellitus with diabetic polyneuropathy: Secondary | ICD-10-CM | POA: Diagnosis not present

## 2020-07-20 DIAGNOSIS — Z794 Long term (current) use of insulin: Secondary | ICD-10-CM

## 2020-07-20 LAB — CBC WITH DIFFERENTIAL/PLATELET
Basophils Absolute: 0.1 10*3/uL (ref 0.0–0.1)
Basophils Relative: 0.9 % (ref 0.0–3.0)
Eosinophils Absolute: 0.2 10*3/uL (ref 0.0–0.7)
Eosinophils Relative: 2.4 % (ref 0.0–5.0)
HCT: 39 % (ref 39.0–52.0)
Hemoglobin: 12.8 g/dL — ABNORMAL LOW (ref 13.0–17.0)
Lymphocytes Relative: 20.9 % (ref 12.0–46.0)
Lymphs Abs: 1.6 10*3/uL (ref 0.7–4.0)
MCHC: 32.8 g/dL (ref 30.0–36.0)
MCV: 97.1 fl (ref 78.0–100.0)
Monocytes Absolute: 0.8 10*3/uL (ref 0.1–1.0)
Monocytes Relative: 10 % (ref 3.0–12.0)
Neutro Abs: 5.1 10*3/uL (ref 1.4–7.7)
Neutrophils Relative %: 65.8 % (ref 43.0–77.0)
Platelets: 270 10*3/uL (ref 150.0–400.0)
RBC: 4.02 Mil/uL — ABNORMAL LOW (ref 4.22–5.81)
RDW: 15.6 % — ABNORMAL HIGH (ref 11.5–15.5)
WBC: 7.7 10*3/uL (ref 4.0–10.5)

## 2020-07-20 LAB — BASIC METABOLIC PANEL
BUN: 37 mg/dL — ABNORMAL HIGH (ref 6–23)
CO2: 34 mEq/L — ABNORMAL HIGH (ref 19–32)
Calcium: 8.8 mg/dL (ref 8.4–10.5)
Chloride: 98 mEq/L (ref 96–112)
Creatinine, Ser: 1.81 mg/dL — ABNORMAL HIGH (ref 0.40–1.50)
GFR: 35.42 mL/min — ABNORMAL LOW (ref >60.00)
Glucose, Bld: 125 mg/dL — ABNORMAL HIGH (ref 70–99)
Potassium: 4.5 mEq/L (ref 3.5–5.1)
Sodium: 138 mEq/L (ref 135–145)

## 2020-07-20 LAB — HEMOGLOBIN A1C: Hgb A1c MFr Bld: 8.6 % — ABNORMAL HIGH (ref 4.6–6.5)

## 2020-07-23 DIAGNOSIS — I509 Heart failure, unspecified: Secondary | ICD-10-CM | POA: Diagnosis not present

## 2020-07-23 DIAGNOSIS — J9600 Acute respiratory failure, unspecified whether with hypoxia or hypercapnia: Secondary | ICD-10-CM | POA: Diagnosis not present

## 2020-07-23 DIAGNOSIS — J449 Chronic obstructive pulmonary disease, unspecified: Secondary | ICD-10-CM | POA: Diagnosis not present

## 2020-07-23 DIAGNOSIS — R062 Wheezing: Secondary | ICD-10-CM | POA: Diagnosis not present

## 2020-07-26 DIAGNOSIS — E119 Type 2 diabetes mellitus without complications: Secondary | ICD-10-CM | POA: Diagnosis not present

## 2020-07-26 DIAGNOSIS — Z794 Long term (current) use of insulin: Secondary | ICD-10-CM | POA: Diagnosis not present

## 2020-07-27 DIAGNOSIS — C679 Malignant neoplasm of bladder, unspecified: Secondary | ICD-10-CM | POA: Diagnosis not present

## 2020-07-27 DIAGNOSIS — N390 Urinary tract infection, site not specified: Secondary | ICD-10-CM | POA: Diagnosis not present

## 2020-07-27 DIAGNOSIS — R338 Other retention of urine: Secondary | ICD-10-CM | POA: Diagnosis not present

## 2020-07-30 ENCOUNTER — Encounter: Payer: Self-pay | Admitting: Family Medicine

## 2020-07-31 ENCOUNTER — Telehealth: Payer: Self-pay

## 2020-07-31 ENCOUNTER — Other Ambulatory Visit: Payer: Self-pay

## 2020-07-31 ENCOUNTER — Emergency Department (HOSPITAL_COMMUNITY): Payer: Medicare Other

## 2020-07-31 ENCOUNTER — Encounter (HOSPITAL_COMMUNITY): Payer: Self-pay

## 2020-07-31 ENCOUNTER — Inpatient Hospital Stay (HOSPITAL_COMMUNITY)
Admission: EM | Admit: 2020-07-31 | Discharge: 2020-08-08 | DRG: 480 | Disposition: A | Discharge status: Skilled Nursing Facility | Payer: Medicare Other | Attending: Internal Medicine | Admitting: Internal Medicine

## 2020-07-31 DIAGNOSIS — R310 Gross hematuria: Secondary | ICD-10-CM | POA: Diagnosis not present

## 2020-07-31 DIAGNOSIS — Z87440 Personal history of urinary (tract) infections: Secondary | ICD-10-CM | POA: Diagnosis not present

## 2020-07-31 DIAGNOSIS — S72302A Unspecified fracture of shaft of left femur, initial encounter for closed fracture: Secondary | ICD-10-CM | POA: Diagnosis not present

## 2020-07-31 DIAGNOSIS — E1165 Type 2 diabetes mellitus with hyperglycemia: Secondary | ICD-10-CM | POA: Diagnosis present

## 2020-07-31 DIAGNOSIS — N390 Urinary tract infection, site not specified: Secondary | ICD-10-CM

## 2020-07-31 DIAGNOSIS — Z87891 Personal history of nicotine dependence: Secondary | ICD-10-CM

## 2020-07-31 DIAGNOSIS — S728X2D Other fracture of left femur, subsequent encounter for closed fracture with routine healing: Secondary | ICD-10-CM

## 2020-07-31 DIAGNOSIS — Z9181 History of falling: Secondary | ICD-10-CM

## 2020-07-31 DIAGNOSIS — E1122 Type 2 diabetes mellitus with diabetic chronic kidney disease: Secondary | ICD-10-CM | POA: Diagnosis present

## 2020-07-31 DIAGNOSIS — M2550 Pain in unspecified joint: Secondary | ICD-10-CM | POA: Diagnosis not present

## 2020-07-31 DIAGNOSIS — Z66 Do not resuscitate: Secondary | ICD-10-CM | POA: Diagnosis present

## 2020-07-31 DIAGNOSIS — S7292XA Unspecified fracture of left femur, initial encounter for closed fracture: Secondary | ICD-10-CM | POA: Diagnosis not present

## 2020-07-31 DIAGNOSIS — E878 Other disorders of electrolyte and fluid balance, not elsewhere classified: Secondary | ICD-10-CM | POA: Diagnosis not present

## 2020-07-31 DIAGNOSIS — R3915 Urgency of urination: Secondary | ICD-10-CM | POA: Diagnosis not present

## 2020-07-31 DIAGNOSIS — Z960 Presence of urogenital implants: Secondary | ICD-10-CM | POA: Diagnosis present

## 2020-07-31 DIAGNOSIS — E1142 Type 2 diabetes mellitus with diabetic polyneuropathy: Secondary | ICD-10-CM | POA: Diagnosis present

## 2020-07-31 DIAGNOSIS — I5032 Chronic diastolic (congestive) heart failure: Secondary | ICD-10-CM | POA: Diagnosis not present

## 2020-07-31 DIAGNOSIS — Z888 Allergy status to other drugs, medicaments and biological substances status: Secondary | ICD-10-CM

## 2020-07-31 DIAGNOSIS — I2510 Atherosclerotic heart disease of native coronary artery without angina pectoris: Secondary | ICD-10-CM | POA: Diagnosis present

## 2020-07-31 DIAGNOSIS — J449 Chronic obstructive pulmonary disease, unspecified: Secondary | ICD-10-CM | POA: Diagnosis present

## 2020-07-31 DIAGNOSIS — Y92009 Unspecified place in unspecified non-institutional (private) residence as the place of occurrence of the external cause: Secondary | ICD-10-CM

## 2020-07-31 DIAGNOSIS — B9620 Unspecified Escherichia coli [E. coli] as the cause of diseases classified elsewhere: Secondary | ICD-10-CM | POA: Diagnosis present

## 2020-07-31 DIAGNOSIS — R52 Pain, unspecified: Secondary | ICD-10-CM | POA: Diagnosis not present

## 2020-07-31 DIAGNOSIS — T84038A Mechanical loosening of other internal prosthetic joint, initial encounter: Secondary | ICD-10-CM | POA: Diagnosis not present

## 2020-07-31 DIAGNOSIS — N179 Acute kidney failure, unspecified: Secondary | ICD-10-CM | POA: Diagnosis not present

## 2020-07-31 DIAGNOSIS — L409 Psoriasis, unspecified: Secondary | ICD-10-CM | POA: Diagnosis present

## 2020-07-31 DIAGNOSIS — M25562 Pain in left knee: Secondary | ICD-10-CM | POA: Diagnosis not present

## 2020-07-31 DIAGNOSIS — N1832 Chronic kidney disease, stage 3b: Secondary | ICD-10-CM | POA: Diagnosis present

## 2020-07-31 DIAGNOSIS — S3993XA Unspecified injury of pelvis, initial encounter: Secondary | ICD-10-CM | POA: Diagnosis not present

## 2020-07-31 DIAGNOSIS — E871 Hypo-osmolality and hyponatremia: Secondary | ICD-10-CM | POA: Diagnosis not present

## 2020-07-31 DIAGNOSIS — Z794 Long term (current) use of insulin: Secondary | ICD-10-CM | POA: Diagnosis not present

## 2020-07-31 DIAGNOSIS — Z1624 Resistance to multiple antibiotics: Secondary | ICD-10-CM | POA: Diagnosis present

## 2020-07-31 DIAGNOSIS — Z4889 Encounter for other specified surgical aftercare: Secondary | ICD-10-CM | POA: Diagnosis not present

## 2020-07-31 DIAGNOSIS — K5900 Constipation, unspecified: Secondary | ICD-10-CM | POA: Diagnosis not present

## 2020-07-31 DIAGNOSIS — E119 Type 2 diabetes mellitus without complications: Secondary | ICD-10-CM | POA: Diagnosis present

## 2020-07-31 DIAGNOSIS — L89152 Pressure ulcer of sacral region, stage 2: Secondary | ICD-10-CM | POA: Diagnosis not present

## 2020-07-31 DIAGNOSIS — T148XXA Other injury of unspecified body region, initial encounter: Secondary | ICD-10-CM

## 2020-07-31 DIAGNOSIS — E1151 Type 2 diabetes mellitus with diabetic peripheral angiopathy without gangrene: Secondary | ICD-10-CM | POA: Diagnosis present

## 2020-07-31 DIAGNOSIS — N400 Enlarged prostate without lower urinary tract symptoms: Secondary | ICD-10-CM | POA: Diagnosis not present

## 2020-07-31 DIAGNOSIS — Z833 Family history of diabetes mellitus: Secondary | ICD-10-CM

## 2020-07-31 DIAGNOSIS — H9190 Unspecified hearing loss, unspecified ear: Secondary | ICD-10-CM | POA: Diagnosis present

## 2020-07-31 DIAGNOSIS — M6281 Muscle weakness (generalized): Secondary | ICD-10-CM | POA: Diagnosis not present

## 2020-07-31 DIAGNOSIS — M1990 Unspecified osteoarthritis, unspecified site: Secondary | ICD-10-CM | POA: Diagnosis present

## 2020-07-31 DIAGNOSIS — W19XXXD Unspecified fall, subsequent encounter: Secondary | ICD-10-CM | POA: Diagnosis not present

## 2020-07-31 DIAGNOSIS — Z20822 Contact with and (suspected) exposure to covid-19: Secondary | ICD-10-CM | POA: Diagnosis present

## 2020-07-31 DIAGNOSIS — M978XXA Periprosthetic fracture around other internal prosthetic joint, initial encounter: Secondary | ICD-10-CM | POA: Diagnosis not present

## 2020-07-31 DIAGNOSIS — M978XXD Periprosthetic fracture around other internal prosthetic joint, subsequent encounter: Secondary | ICD-10-CM | POA: Diagnosis not present

## 2020-07-31 DIAGNOSIS — Z7951 Long term (current) use of inhaled steroids: Secondary | ICD-10-CM

## 2020-07-31 DIAGNOSIS — S72332A Displaced oblique fracture of shaft of left femur, initial encounter for closed fracture: Secondary | ICD-10-CM | POA: Diagnosis present

## 2020-07-31 DIAGNOSIS — M898X9 Other specified disorders of bone, unspecified site: Secondary | ICD-10-CM | POA: Diagnosis present

## 2020-07-31 DIAGNOSIS — W19XXXA Unspecified fall, initial encounter: Secondary | ICD-10-CM | POA: Diagnosis not present

## 2020-07-31 DIAGNOSIS — Z96642 Presence of left artificial hip joint: Secondary | ICD-10-CM | POA: Diagnosis not present

## 2020-07-31 DIAGNOSIS — D62 Acute posthemorrhagic anemia: Secondary | ICD-10-CM | POA: Diagnosis not present

## 2020-07-31 DIAGNOSIS — Z8701 Personal history of pneumonia (recurrent): Secondary | ICD-10-CM

## 2020-07-31 DIAGNOSIS — T84418A Breakdown (mechanical) of other internal orthopedic devices, implants and grafts, initial encounter: Secondary | ICD-10-CM | POA: Diagnosis not present

## 2020-07-31 DIAGNOSIS — Z881 Allergy status to other antibiotic agents status: Secondary | ICD-10-CM

## 2020-07-31 DIAGNOSIS — Z89432 Acquired absence of left foot: Secondary | ICD-10-CM

## 2020-07-31 DIAGNOSIS — L8990 Pressure ulcer of unspecified site, unspecified stage: Secondary | ICD-10-CM | POA: Diagnosis not present

## 2020-07-31 DIAGNOSIS — I130 Hypertensive heart and chronic kidney disease with heart failure and stage 1 through stage 4 chronic kidney disease, or unspecified chronic kidney disease: Secondary | ICD-10-CM | POA: Diagnosis not present

## 2020-07-31 DIAGNOSIS — Z79899 Other long term (current) drug therapy: Secondary | ICD-10-CM

## 2020-07-31 DIAGNOSIS — E875 Hyperkalemia: Secondary | ICD-10-CM | POA: Diagnosis not present

## 2020-07-31 DIAGNOSIS — S72402D Unspecified fracture of lower end of left femur, subsequent encounter for closed fracture with routine healing: Secondary | ICD-10-CM | POA: Diagnosis not present

## 2020-07-31 DIAGNOSIS — I499 Cardiac arrhythmia, unspecified: Secondary | ICD-10-CM | POA: Diagnosis not present

## 2020-07-31 DIAGNOSIS — N401 Enlarged prostate with lower urinary tract symptoms: Secondary | ICD-10-CM | POA: Diagnosis present

## 2020-07-31 DIAGNOSIS — N183 Chronic kidney disease, stage 3 unspecified: Secondary | ICD-10-CM | POA: Diagnosis not present

## 2020-07-31 DIAGNOSIS — M79605 Pain in left leg: Secondary | ICD-10-CM | POA: Diagnosis not present

## 2020-07-31 DIAGNOSIS — Z8551 Personal history of malignant neoplasm of bladder: Secondary | ICD-10-CM

## 2020-07-31 DIAGNOSIS — Z96649 Presence of unspecified artificial hip joint: Secondary | ICD-10-CM

## 2020-07-31 DIAGNOSIS — E860 Dehydration: Secondary | ICD-10-CM | POA: Diagnosis not present

## 2020-07-31 DIAGNOSIS — R338 Other retention of urine: Secondary | ICD-10-CM | POA: Diagnosis present

## 2020-07-31 DIAGNOSIS — W010XXA Fall on same level from slipping, tripping and stumbling without subsequent striking against object, initial encounter: Secondary | ICD-10-CM | POA: Diagnosis present

## 2020-07-31 DIAGNOSIS — Z9852 Vasectomy status: Secondary | ICD-10-CM

## 2020-07-31 DIAGNOSIS — Z8249 Family history of ischemic heart disease and other diseases of the circulatory system: Secondary | ICD-10-CM

## 2020-07-31 DIAGNOSIS — S7292XD Unspecified fracture of left femur, subsequent encounter for closed fracture with routine healing: Secondary | ICD-10-CM | POA: Diagnosis not present

## 2020-07-31 DIAGNOSIS — Z7401 Bed confinement status: Secondary | ICD-10-CM | POA: Diagnosis not present

## 2020-07-31 DIAGNOSIS — M9702XA Periprosthetic fracture around internal prosthetic left hip joint, initial encounter: Secondary | ICD-10-CM | POA: Diagnosis not present

## 2020-07-31 DIAGNOSIS — R0602 Shortness of breath: Secondary | ICD-10-CM | POA: Diagnosis not present

## 2020-07-31 DIAGNOSIS — K5730 Diverticulosis of large intestine without perforation or abscess without bleeding: Secondary | ICD-10-CM | POA: Diagnosis present

## 2020-07-31 DIAGNOSIS — Z419 Encounter for procedure for purposes other than remedying health state, unspecified: Secondary | ICD-10-CM

## 2020-07-31 DIAGNOSIS — Z4789 Encounter for other orthopedic aftercare: Secondary | ICD-10-CM | POA: Diagnosis not present

## 2020-07-31 DIAGNOSIS — Z882 Allergy status to sulfonamides status: Secondary | ICD-10-CM

## 2020-07-31 DIAGNOSIS — Z79890 Hormone replacement therapy (postmenopausal): Secondary | ICD-10-CM

## 2020-07-31 DIAGNOSIS — Z743 Need for continuous supervision: Secondary | ICD-10-CM | POA: Diagnosis not present

## 2020-07-31 LAB — CBC
HCT: 40.1 % (ref 39.0–52.0)
HCT: 39.1 % (ref 39.0–52.0)
Hemoglobin: 12.7 g/dL — ABNORMAL LOW (ref 13.0–17.0)
Hemoglobin: 12.3 g/dL — ABNORMAL LOW (ref 13.0–17.0)
MCH: 31.3 pg (ref 26.0–34.0)
MCH: 31.1 pg (ref 26.0–34.0)
MCHC: 31.7 g/dL (ref 30.0–36.0)
MCHC: 31.5 g/dL (ref 30.0–36.0)
MCV: 98.8 fL (ref 80.0–100.0)
MCV: 99 fL (ref 80.0–100.0)
Platelets: 305 10*3/uL (ref 150–400)
Platelets: 286 10*3/uL (ref 150–400)
RBC: 4.06 MIL/uL — ABNORMAL LOW (ref 4.22–5.81)
RBC: 3.95 MIL/uL — ABNORMAL LOW (ref 4.22–5.81)
RDW: 14.5 % (ref 11.5–15.5)
RDW: 14.5 % (ref 11.5–15.5)
WBC: 12.1 10*3/uL — ABNORMAL HIGH (ref 4.0–10.5)
WBC: 12.6 10*3/uL — ABNORMAL HIGH (ref 4.0–10.5)
nRBC: 0 % (ref 0.0–0.2)
nRBC: 0 % (ref 0.0–0.2)

## 2020-07-31 LAB — COMPREHENSIVE METABOLIC PANEL
ALT: 11 U/L (ref 0–44)
AST: 14 U/L — ABNORMAL LOW (ref 15–41)
Albumin: 3.3 g/dL — ABNORMAL LOW (ref 3.5–5.0)
Alkaline Phosphatase: 80 U/L (ref 38–126)
Anion gap: 8 (ref 5–15)
BUN: 46 mg/dL — ABNORMAL HIGH (ref 8–23)
CO2: 30 mmol/L (ref 22–32)
Calcium: 8.4 mg/dL — ABNORMAL LOW (ref 8.9–10.3)
Chloride: 99 mmol/L (ref 98–111)
Creatinine, Ser: 1.94 mg/dL — ABNORMAL HIGH (ref 0.61–1.24)
GFR calc Af Amer: 35 mL/min — ABNORMAL LOW (ref >60)
GFR calc non Af Amer: 30 mL/min — ABNORMAL LOW (ref >60)
Glucose, Bld: 150 mg/dL — ABNORMAL HIGH (ref 70–99)
Potassium: 4.7 mmol/L (ref 3.5–5.1)
Sodium: 137 mmol/L (ref 135–145)
Total Bilirubin: 0.5 mg/dL (ref 0.3–1.2)
Total Protein: 7.4 g/dL (ref 6.5–8.1)

## 2020-07-31 LAB — URINALYSIS, ROUTINE W REFLEX MICROSCOPIC
Bilirubin Urine: NEGATIVE
Glucose, UA: NEGATIVE mg/dL
Ketones, ur: NEGATIVE mg/dL
Nitrite: NEGATIVE
Protein, ur: 100 mg/dL — AB
Specific Gravity, Urine: 1.012 (ref 1.005–1.030)
WBC, UA: >50 WBC/hpf — ABNORMAL HIGH (ref 0–5)
pH: 6 (ref 5.0–8.0)

## 2020-07-31 LAB — SARS CORONAVIRUS 2 BY RT PCR (HOSPITAL ORDER, PERFORMED IN ~~LOC~~ HOSPITAL LAB): SARS Coronavirus 2: NEGATIVE

## 2020-07-31 LAB — SURGICAL PCR SCREEN
MRSA, PCR: NEGATIVE
MRSA, PCR: NEGATIVE
Staphylococcus aureus: NEGATIVE
Staphylococcus aureus: NEGATIVE

## 2020-07-31 LAB — CREATININE, SERUM
Creatinine, Ser: 1.99 mg/dL — ABNORMAL HIGH (ref 0.61–1.24)
GFR calc Af Amer: 34 mL/min — ABNORMAL LOW (ref >60)
GFR calc non Af Amer: 29 mL/min — ABNORMAL LOW (ref >60)

## 2020-07-31 MED ORDER — UMECLIDINIUM BROMIDE 62.5 MCG/INH IN AEPB
1.0000 | INHALATION_SPRAY | Freq: Every day | RESPIRATORY_TRACT | Status: DC
  Administered 2020-08-01 – 2020-08-08 (×8): 1 via RESPIRATORY_TRACT
  Filled 2020-07-31 (×2): qty 7

## 2020-07-31 MED ORDER — CEPHALEXIN 500 MG PO CAPS
500.0000 mg | ORAL_CAPSULE | Freq: Once | ORAL | Status: AC
  Administered 2020-07-31: 500 mg via ORAL
  Filled 2020-07-31: qty 1

## 2020-07-31 MED ORDER — OXYCODONE HCL 5 MG PO TABS
5.0000 mg | ORAL_TABLET | Freq: Once | ORAL | Status: AC
  Administered 2020-07-31: 5 mg via ORAL
  Filled 2020-07-31: qty 1

## 2020-07-31 MED ORDER — CEFAZOLIN SODIUM-DEXTROSE 2-4 GM/100ML-% IV SOLN
2.0000 g | INTRAVENOUS | Status: AC
  Administered 2020-08-01: 2 g via INTRAVENOUS
  Filled 2020-07-31: qty 100

## 2020-07-31 MED ORDER — TAMSULOSIN HCL 0.4 MG PO CAPS
0.8000 mg | ORAL_CAPSULE | Freq: Every day | ORAL | Status: DC
  Administered 2020-07-31 – 2020-08-08 (×8): 0.8 mg via ORAL
  Filled 2020-07-31 (×8): qty 2

## 2020-07-31 MED ORDER — METHOCARBAMOL 500 MG PO TABS
500.0000 mg | ORAL_TABLET | Freq: Four times a day (QID) | ORAL | Status: DC | PRN
  Administered 2020-08-05 (×2): 500 mg via ORAL
  Filled 2020-07-31 (×2): qty 1

## 2020-07-31 MED ORDER — DOCUSATE SODIUM 100 MG PO CAPS
100.0000 mg | ORAL_CAPSULE | Freq: Every day | ORAL | Status: DC | PRN
  Administered 2020-08-06: 100 mg via ORAL
  Filled 2020-07-31: qty 1

## 2020-07-31 MED ORDER — APREMILAST 30 MG PO TABS
30.0000 mg | ORAL_TABLET | Freq: Every day | ORAL | Status: DC
  Administered 2020-08-03 – 2020-08-06 (×4): 30 mg via ORAL
  Filled 2020-07-31 (×4): qty 1

## 2020-07-31 MED ORDER — ESCITALOPRAM OXALATE 10 MG PO TABS
5.0000 mg | ORAL_TABLET | Freq: Every day | ORAL | Status: DC
  Administered 2020-08-02 – 2020-08-08 (×7): 5 mg via ORAL
  Filled 2020-07-31 (×7): qty 1

## 2020-07-31 MED ORDER — CEPHALEXIN 500 MG PO CAPS: 500.0000 mg | ORAL_CAPSULE | Freq: Two times a day (BID) | ORAL | Status: DC

## 2020-07-31 MED ORDER — ATORVASTATIN CALCIUM 10 MG PO TABS
10.0000 mg | ORAL_TABLET | Freq: Every day | ORAL | Status: DC
  Administered 2020-07-31 – 2020-08-08 (×8): 10 mg via ORAL
  Filled 2020-07-31 (×8): qty 1

## 2020-07-31 MED ORDER — MUPIROCIN 2 % EX OINT
1.0000 "application " | TOPICAL_OINTMENT | Freq: Two times a day (BID) | CUTANEOUS | Status: AC
  Administered 2020-07-31 – 2020-08-05 (×10): 1 via NASAL
  Filled 2020-07-31 (×2): qty 22

## 2020-07-31 MED ORDER — CHLORHEXIDINE GLUCONATE 4 % EX LIQD
CUTANEOUS | Status: AC
  Filled 2020-07-31: qty 15

## 2020-07-31 MED ORDER — INSULIN GLARGINE 100 UNIT/ML ~~LOC~~ SOLN
5.0000 [IU] | Freq: Two times a day (BID) | SUBCUTANEOUS | Status: DC
  Administered 2020-07-31 – 2020-08-03 (×7): 5 [IU] via SUBCUTANEOUS
  Filled 2020-07-31 (×10): qty 0.05

## 2020-07-31 MED ORDER — FLUTICASONE FUROATE-VILANTEROL 200-25 MCG/INH IN AEPB: 1.0000 | INHALATION_SPRAY | Freq: Every day | RESPIRATORY_TRACT | Status: DC

## 2020-07-31 MED ORDER — FLUTICASONE FUROATE-VILANTEROL 200-25 MCG/INH IN AEPB
1.0000 | INHALATION_SPRAY | Freq: Every day | RESPIRATORY_TRACT | Status: DC
  Administered 2020-08-01 – 2020-08-08 (×8): 1 via RESPIRATORY_TRACT
  Filled 2020-07-31: qty 28

## 2020-07-31 MED ORDER — MORPHINE SULFATE (PF) 2 MG/ML IV SOLN
0.5000 mg | INTRAVENOUS | Status: DC | PRN
  Administered 2020-07-31 – 2020-08-06 (×3): 0.5 mg via INTRAVENOUS
  Filled 2020-07-31 (×3): qty 1

## 2020-07-31 MED ORDER — HEPARIN SODIUM (PORCINE) 5000 UNIT/ML IJ SOLN
5000.0000 [IU] | Freq: Three times a day (TID) | INTRAMUSCULAR | Status: DC
  Administered 2020-07-31: 5000 [IU] via SUBCUTANEOUS
  Filled 2020-07-31: qty 1

## 2020-07-31 MED ORDER — ACETAMINOPHEN 325 MG PO TABS
325.0000 mg | ORAL_TABLET | ORAL | Status: DC | PRN
  Administered 2020-08-01 – 2020-08-02 (×2): 650 mg via ORAL
  Filled 2020-07-31 (×2): qty 2

## 2020-07-31 MED ORDER — SENNA 8.6 MG PO TABS: 2.0000 | ORAL_TABLET | Freq: Every evening | ORAL | Status: DC | PRN

## 2020-07-31 MED ORDER — HYDROCODONE-ACETAMINOPHEN 5-325 MG PO TABS
1.0000 | ORAL_TABLET | Freq: Four times a day (QID) | ORAL | Status: DC | PRN
  Administered 2020-08-02 – 2020-08-08 (×9): 1 via ORAL
  Filled 2020-07-31 (×9): qty 1

## 2020-07-31 MED ORDER — TIOTROPIUM BROMIDE MONOHYDRATE 18 MCG IN CAPS: 18.0000 ug | ORAL_CAPSULE | Freq: Every day | RESPIRATORY_TRACT | Status: DC

## 2020-07-31 MED ORDER — POLYETHYLENE GLYCOL 3350 17 G PO PACK: 17.0000 g | PACK | Freq: Every day | ORAL | Status: DC | PRN

## 2020-07-31 MED ORDER — CALCIUM CARBONATE 1250 (500 CA) MG PO TABS
1.0000 | ORAL_TABLET | Freq: Every day | ORAL | Status: DC
  Administered 2020-08-02 – 2020-08-08 (×7): 500 mg via ORAL
  Filled 2020-07-31 (×8): qty 1

## 2020-07-31 NOTE — ED Notes (Signed)
Pt placed on male purewick at 50 mmHg due to incontinence.

## 2020-07-31 NOTE — ED Notes (Signed)
Attempted to call report to [facility], but no answer by staff.

## 2020-07-31 NOTE — ED Provider Notes (Signed)
[facility] Emergency Department Provider Note MRN:  [id number]  Arrival date & time: 07/31/20     Chief Complaint   Fall and Leg Pain   History of Present Illness   [name] Small is a 86 y.o. year-old male with a history of CAD presenting to the ED with chief complaint of fall and leg pain.  Patient is feeling generally unwell with increased urinary urgency over the weekend.  Has been diagnosed with a UTI by PCP and is starting antibiotics today.  Was walking today and thinks that he possibly lost his balance or tripped in the left leg gave out and he fell.  Since the fall he has had significant pain to the left leg.  Denies head trauma, no loss of consciousness, does not take blood thinners, no neck or back pain, no chest pain or shortness of breath, no abdominal pain.  Also complaining of some chronic pain to the buttocks from a sore/wound that he would like checked out.  Denies fever.  Review of Systems  A complete 10 system review of systems was obtained and all systems are negative except as noted in the HPI and PMH.   Patient's Health History    Past Medical History:  Diagnosis Date  . (HFpEF) heart failure with preserved ejection fraction ([place])   . Arthritis   . Back pain    s/p lumbar injection 2014  . BENIGN PROSTATIC HYPERTROPHY 05/21/2007  . Bladder cancer ([place]) 10/2011  . CAD (coronary artery disease)    nonobstructive by cath 6/12:  mid LAD 30%, proximal obtuse marginal-2 30%, proximal RCA 20%, mid RCA 30-40%.  He had normal cardiac output and mildly elevated filling pressures but no significant pulmonary hypertension;   Echocardiogram in May 2012 demonstrated EF 50-55% and left atrial enlargement   . Cellulitis of left foot    Hospitalized in 2006  . Chronic kidney disease (CKD), stage III (moderate) 12/04/2007   FOLLOWED BY DR PATEL  . Chronic pain of lower extremity   . Complication of anesthesia    1 time bladder cancer surgery- 2012 ,  oxygen satruratioon dropped had to stay overnight, no problems since then.  Marland Kitchen COPD (chronic obstructive pulmonary disease) ([place])   . DIABETES MELLITUS, TYPE II 05/21/2007  . DIVERTICULOSIS, COLON 05/21/2007   pt denies  . Dyspnea    with exertion, patient mointors saturation  . Fatigue AGE-RELATED  . Gross hematuria    pt denies  . HYPERTENSION 05/21/2007  . Impaired hearing BILATERAL HEARING AIDS  . On home oxygen therapy    as needed  . PAD (peripheral artery disease) ([place])    a. 02/2016 L foot nonhealing ulcer-->Periph angio: L pop 100 w/ reconstitution via extensive vollats to the prox-mid peroneal (only patent vessel BK)-->PTA of L Pop and peroneal; b. Ongoing LE ischemia 5 & 06/2016 req amputation of toes on L foot.  . Pneumonia    Hx  of   . Psoriasis ELBOWS  . Psoriasis   . PSORIASIS, SCALP 10/25/2008    Past Surgical History:  Procedure Laterality Date  . AMPUTATION Left 03/27/2016   Procedure: partial first AMPUTATION RAY; LEFT;  Surgeon: Trula Slade, DPM;  Location: [facility];  Service: Podiatry;  Laterality: Left;  . AMPUTATION Left 06/12/2016   Procedure: TRANSMETATARSAL AMPUTATION LEFT FOOT;  Surgeon: Newt Minion, MD;  Location: [facility];  Service: Orthopedics;  Laterality: Left;  . APPENDECTOMY  1941  . BIOPSY  05/16/2020  Procedure: BIOPSY;  Surgeon: Lavena Bullion, DO;  [facility];  Service: Gastroenterology;;  . CARDIAC CATHETERIZATION  04-24-11/  DR Rogue Jury ARIDA   MILD NONOBSTRUCTIVE CAD, NORMA CARDIAC OUTPUT  . CARDIOVASCULAR STRESS TEST  2007  . CATARACT EXTRACTION W/ INTRAOCULAR LENS  IMPLANT, BILATERAL Bilateral ~ 2010  . CYSTOSCOPY  12/25/2011   Procedure: CYSTOSCOPY;  Surgeon: Molli Hazard, MD;  [facility];  Service: Urology;  Laterality: N/A;  needs intubation and to be paralyzed   . CYSTOSCOPY W/ RETROGRADES  10/30/2011   Procedure: CYSTOSCOPY WITH RETROGRADE PYELOGRAM;  Surgeon: Molli Hazard, MD;   Location: [facility];  Service: Urology;  Laterality: Bilateral;  CYSTOSCOPY POSS TURBT BILATERAL RETROGRADE PYLEOGRAM   . CYSTOSCOPY W/ RETROGRADES  11/30/2012   Procedure: CYSTOSCOPY WITH RETROGRADE PYELOGRAM;  Surgeon: Molli Hazard, MD;  Location[facility];  Service: Urology;  Laterality: Bilateral;  Flexible cystoscopy.  . CYSTOSCOPY WITH BIOPSY  11/30/2012   Procedure: CYSTOSCOPY WITH BIOPSY;  Surgeon: Molli Hazard, MD;  Location: [facility];  Service: Urology;  Laterality: N/A;  . ESOPHAGOGASTRODUODENOSCOPY (EGD) WITH PROPOFOL N/A 05/16/2020   Procedure: ESOPHAGOGASTRODUODENOSCOPY (EGD) WITH PROPOFOL;  Surgeon: Lavena Bullion, DO;  [facility];  Service: Gastroenterology;  Laterality: N/A;  . INCISION AND DRAINAGE FOOT Left ~ 2005   LEFT FOOT DUE TO INFECTION FROM  NAIL PUNCTURE INJURY  . LUMBAR LAMINECTOMY/DECOMPRESSION MICRODISCECTOMY N/A 10/18/2016   Procedure: LEFT AND CENTRAL L4-5 LUMBAR LAMINECTOMY WITH RESECTION OF SYNOVIAL CYST;  Surgeon: Jessy Oto, MD;  Location: [facility];  Service: Orthopedics;  Laterality: N/A;  . ORIF FEMUR FRACTURE Left 04/19/2020   Procedure: OPEN REDUCTION INTERNAL FIXATION FEMORAL SHAFT FRACTURE;  Surgeon: Shona Needles, MD;  Location: [place];  Service: Orthopedics;  Laterality: Left;  . PENILE PROSTHESIS IMPLANT  1990s  . PERIPHERAL VASCULAR CATHETERIZATION N/A 02/14/2016   Procedure: Abdominal Aortogram w/Lower Extremity;  Surgeon: Wellington Hampshire, MD;  Location: [place] [facility];  Service: Cardiovascular;  Laterality: N/A;  . PERIPHERAL VASCULAR CATHETERIZATION Left 02/28/2016   Procedure: Peripheral Vascular Balloon Angioplasty;  Surgeon: Wellington Hampshire, MD;  Location: [place] [facility];  Service: Cardiovascular;  Laterality: Left;  left peroneal and popliteal artery  . [place]  . TOTAL HIP ARTHROPLASTY Left 12/27/2016   Procedure: LEFT TOTAL  HIP ARTHROPLASTY ANTERIOR APPROACH;  Surgeon: Mcarthur Rossetti, MD;  [facility];  Service: Orthopedics;  Laterality: Left;  . TRANSURETHRAL RESECTION OF BLADDER TUMOR  10/30/2011   Procedure: TRANSURETHRAL RESECTION OF BLADDER TUMOR (TURBT);  Surgeon: Molli Hazard, MD;  Location: [facility];  Service: Urology;  Laterality: N/A;  . TRANSURETHRAL RESECTION OF BLADDER TUMOR  12/25/2011   Procedure: TRANSURETHRAL RESECTION OF BLADDER TUMOR (TURBT);  Surgeon: Molli Hazard, MD;  [facility]  Service: Urology;  Laterality: N/A;  need long gyrus instruments   . VASECTOMY  1990s    Family History  Problem Relation Age of Onset  . Diabetes Mother   . Heart disease Brother   . Heart disease Brother   . Heart disease Brother   . Heart disease Brother   . Heart disease Brother   . Heart disease Brother   . Lung cancer Brother     Social History   Socioeconomic History  . Marital status: Married    Spouse name: Not on file  . Number  of children: 4  . Years of education: Not on file  . Highest education level: Not on file  Occupational History  . Occupation: retired     Comment: [employer]	[place].  Tobacco Use  . Smoking status: Former Smoker    Packs/day: 2.00    Years: 22.00    Pack years: 44.00    Types: Cigarettes    Quit date: 11/11/1968    Years since quitting: 51.7  . Smokeless tobacco: Never Used  Vaping Use  . Vaping Use: Never used  Substance and Sexual Activity  . Alcohol use: Yes    Comment: occassional- 10/17/16- none  in 4 months- "too sick"  . Drug use: No  . Sexual activity: Not Currently  Other Topics Concern  . Not on file  Social History Narrative   Retired Event organiser.   Active with golf.   [place] vet.  [place] [phone].     Social Determinants of Health   Financial Resource Strain:   . Difficulty of Paying Living Expenses: Not on file  Food Insecurity: No Food Insecurity  . Worried  About Charity fundraiser in the Last Year: Never true  . Ran Out of Food in the Last Year: Never true  Transportation Needs: No Transportation Needs  . Lack of Transportation (Medical): No  . Lack of Transportation (Non-Medical): No  Physical Activity: Insufficiently Active  . Days of Exercise per Week: 7 days  . Minutes of Exercise per Session: 10 min  Stress:   . Feeling of Stress : Not on file  Social Connections:   . Frequency of Communication with Friends and Family: Not on file  . Frequency of Social Gatherings with Friends and Family: Not on file  . Attends Religious Services: Not on file  . Active Member of Clubs or Organizations: Not on file  . Attends Archivist Meetings: Not on file  . Marital Status: Not on file  Intimate Partner Violence:   . Fear of Current or Ex-Partner: Not on file  . Emotionally Abused: Not on file  . Physically Abused: Not on file  . Sexually Abused: Not on file     Physical Exam   Vitals:   07/31/20 1029 07/31/20 1258  BP: 140/86 130/68  Pulse: 84 85  Resp: 15 20  Temp: (!) 97.2 F (36.2 C)   SpO2: 100% 95%    CONSTITUTIONAL: Chronically ill-appearing, NAD NEURO:  Alert and oriented x 3, no focal deficits EYES:  eyes equal and reactive ENT/NECK:  no LAD, no JVD CARDIO: Regular rate, well-perfused, normal S1 and S2 PULM:  CTAB no wheezing or rhonchi GI/GU:  normal bowel sounds, non-distended, non-tender MSK/SPINE: Complete toe amputations to the left foot, tenderness to palpation to the left knee, proximal shin, distal femur SKIN: Abrasion and bruising to left wrist, no snuffbox tenderness, normal range of motion PSYCH:  Appropriate speech and behavior  *Additional and/or pertinent findings included in MDM below  Diagnostic and Interventional Summary    EKG Interpretation  Date/Time:    Ventricular Rate:    PR Interval:    QRS Duration:   QT Interval:    QTC Calculation:   R Axis:     Text Interpretation:         Labs Reviewed  CBC - Abnormal; Notable for the following components:      Result Value   WBC 12.1 (*)    RBC 4.06 (*)    Hemoglobin 12.7 (*)    All  other components within normal limits  COMPREHENSIVE METABOLIC PANEL - Abnormal; Notable for the following components:   Glucose, Bld 150 (*)    BUN 46 (*)    Creatinine, Ser 1.94 (*)    Calcium 8.4 (*)    Albumin 3.3 (*)    AST 14 (*)    GFR calc non Af Amer 30 (*)    GFR calc Af Amer 35 (*)    All other components within normal limits  SARS CORONAVIRUS 2 BY RT PCR (HOSPITAL ORDER, [facility])    DG Tibia/Fibula Left  Final Result    DG Knee Complete 4 Views Left  Final Result    DG Femur Portable Min 2 Views Left  Final Result    DG Pelvis 1-2 Views  Final Result      Medications  cephALEXin (KEFLEX) capsule 500 mg (has no administration in time range)  oxyCODONE (Oxy IR/ROXICODONE) immediate release tablet 5 mg (5 mg Oral Given 07/31/20 1113)     Procedures  /  Critical Care Procedures  ED Course and Medical Decision Making  I have reviewed the triage vital signs, the nursing notes, and pertinent available records from the EMR.  Listed above are laboratory and imaging tests that I personally ordered, reviewed, and interpreted and then considered in my medical decision making (see below for details).  X-rays to evaluate for fracture, does have a hip replacement on the side.  Neurovascularly intact.     X-ray confirms fracture near the femur hardware.  Discussed with Dr. Ginette Pitman orthopedics, recommending admission.  Admitted to hospital service for [name]  Barth Kirks. [place] Small, [place] [email or web address]  Final Clinical Impressions(s) / ED Diagnoses     ICD-10-CM   1. Closed fracture of left femur, unspecified fracture morphology, unspecified portion of femur, initial encounter ([place])  S72.92XA     ED Discharge  Orders    None       Discharge Instructions Discussed with and Provided to Patient:   Discharge Instructions   None       Maudie Flakes, MD 07/31/20 1510

## 2020-07-31 NOTE — Progress Notes (Signed)
Orthopaedic Trauma Service   Ortho aware of patient Periprosthetic L distal femur fracture Will require surgical intervention   Plan for OR tomorrow afternoon as long as he remains medically stable   NPO after MN   Jari Pigg, PA-C [phone] (C) 07/31/2020, 4:19 PM  [facility] [place] [address] [phone] Domingo Sep (F)

## 2020-07-31 NOTE — TOC Initial Note (Addendum)
Transition of Care [facility]) - Initial/Assessment Note    Patient Details  Name: [name] Small MRN: [id number] Date of Birth: [birth year]  Transition of Care [facility]) CM/SW Contact:    Erenest Rasher, RN Phone Number: [phone] 07/31/2020, 6:22 PM  Clinical Narrative:                 TOC CM spoke to pt and wife, [name] Small at bedside. Wife states pt has been to rehab in the past. They are requesting [facility]. Wife states pt had an aide in the past from the [place]. [facility] once [facility] is [facility] from [facility] rehab. Gave permission to created [facility] and fax out for SNF rehab. Pt is active with [facility]. CM notified [place] pt is requesting SNF. Waiting PT evaluation.   Expected Discharge Plan: Skilled Nursing Facility Barriers to Discharge: Continued Medical Work up   Patient Goals and CMS Choice Patient states their goals for this hospitalization and ongoing recovery are:: patient and wife would like rehab CMS Medicare.gov Compare Post Acute Care list provided to:: Patient Choice offered to / list presented to : Patient  Expected Discharge Plan and Services Expected Discharge Plan: [place] In-house Referral: Clinical Social Work Discharge Planning Services: CM Consult Post Acute Care Choice: [place] arrangements for the past 2 months: [place]                                      Prior Living Arrangements/Services Living arrangements for the past 2 months: Single Family Home Lives with:: Spouse Patient language and need for interpreter reviewed:: Yes Do you feel safe going back to the place where you live?: No   requesting rehab  Need for Family Participation in Patient Care: Yes (Comment) Care giver support system in place?: Yes (comment) Current home services: DME (rolling walker, wheelchair, ramp, bedside commode, oxygen ([facility]), lift chair) Criminal Activity/Legal  Involvement Pertinent to Current Situation/Hospitalization: No - Comment as needed  Activities of Daily Living Home Assistive Devices/Equipment: Dentures (specify type), Walker (specify type), Built-in shower seat, Hearing aid (upper/lower partial plates, front wheeled walker, walk-in shower with seat, bilateral hearing aids) ADL Screening (condition at time of admission) Patient's cognitive ability adequate to safely complete daily activities?: Yes Is the patient deaf or have difficulty hearing?: Yes (wears bilateral hearing aids) Does the patient have difficulty seeing, even when wearing glasses/contacts?: No Does the patient have difficulty concentrating, remembering, or making decisions?: No Patient able to express need for assistance with ADLs?: Yes Does the patient have difficulty dressing or bathing?: Yes Independently performs ADLs?: No Communication: Independent Dressing (OT): Needs assistance Is this a change from baseline?: Change from baseline, expected to last >3 days Grooming: Needs assistance Is this a change from baseline?: Change from baseline, expected to last >3 days Feeding: Needs assistance Is this a change from baseline?: Change from baseline, expected to last >3 days Bathing: Needs assistance Is this a change from baseline?: Change from baseline, expected to last >3 days Toileting: Dependent Is this a change from baseline?: Change from baseline, expected to last >3days In/Out Bed: Dependent Is this a change from baseline?: Change from baseline, expected to last >3 days Walks in Home: Dependent Is this a change from baseline?: Change from baseline, expected to last >3 days Does the patient have  difficulty walking or climbing stairs?: Yes (secondary to left hip injury) Weakness of Legs: Left Weakness of Arms/Hands: None  Permission Sought/Granted Permission sought to share information with : Case Manager, Customer service manager, PCP, Family  Supports Permission granted to share information with : Yes, Verbal Permission Granted  Share Information with NAME: Leeanne Deed  Permission granted to share info w AGENCY: SNF, [facility] granted to share info w Relationship: wife  Permission granted to share info w Contact Information: [phone]  Emotional Assessment Appearance:: Appears stated age Attitude/Demeanor/Rapport: Gracious Affect (typically observed): Accepting Orientation: : Oriented to Place, Oriented to  Time, Oriented to Self, Oriented to Situation   Psych Involvement: No (comment)  Admission diagnosis:  Femur fracture, left ([place]) [S72.92XA] Patient Active Problem List   Diagnosis Date Noted  . Recurrent UTI 07/31/2020  . Insomnia 05/25/2020  . Advance care planning   . Palliative care by specialist   . Gastritis and gastroduodenitis   . Gastroesophageal reflux disease with esophagitis without hemorrhage   . Ulcer of esophagus without bleeding   . GI bleed 05/14/2020  . Urinary retention with incomplete bladder emptying 05/10/2020  . Adjustment reaction of late life   . Femur fracture, left ([place]) 04/26/2020  . Periprosthetic hip fracture 04/26/2020  . Acute kidney injury ([place])   . Closed left subtrochanteric femur fracture, initial encounter ([place])   . Acute blood loss anemia   . Gross hematuria   . Controlled type 2 diabetes mellitus with hyperglycemia, with long-term current use of insulin ([place])   . Benign essential HTN   . Post-operative pain   . Closed left hip fracture, initial encounter ([place]) 04/18/2020  . Anxiety 12/15/2019  . Laceration of right thumb without foreign body with damage to nail 12/15/2019  . Anemia 01/31/2017  . Hypoxia   . History of hip replacement 12/26/2016    Class: Chronic  . Status post lumbar laminectomy 12/26/2016    Class: Chronic  . S/p left hip fracture 12/26/2016  . Idiopathic chronic venous hypertension of left lower extremity with inflammation  11/07/2016  . Spinal stenosis of lumbar region with neurogenic claudication 10/18/2016  . Spinal stenosis of lumbar region 09/25/2016  . Fall 07/04/2016  . COPD (chronic obstructive pulmonary disease) ([place]) 06/10/2016  . Weakness generalized 03/18/2016  . Critical lower limb ischemia 02/28/2016  . Peripheral arterial disease ([place]) 10/17/2015  . Edema 07/19/2015  . Claudication ([place]) 01/31/2015  . DOE (dyspnea on exertion) 01/31/2015  . Hyperlipidemia 01/31/2015  . HTN (hypertension) 01/31/2015  . Chronic diastolic CHF (congestive heart failure) ([place]) 01/21/2014  . Atrial fibrillation ([place]) 12/16/2012  . Hyperkalemia 11/27/2011  . Bladder cancer ([place]) 09/22/2011  . Leg cramps 08/28/2011  . Cough 05/26/2011  . CAD (coronary artery disease) of artery bypass graft 05/09/2011  . Dyspnea on exertion 04/19/2011  . PSORIASIS, SCALP 10/25/2008  . Chronic kidney disease, stage III (moderate) 12/04/2007  . Type 2 diabetes mellitus with diabetic polyneuropathy ([place]) 05/21/2007  . DIVERTICULOSIS, COLON 05/21/2007  . BPH (benign prostatic hyperplasia) 05/21/2007   PCP:  Tonia Ghent, MD Pharmacy:   [facility]) [place], [place] [address] Phone: [phone] Fax: [phone]  [place], [address] [place] [address] Phone: [phone] Fax: [phone]  [place], [address], [address] [place],  [place] [address] [address] Phone: [phone] Fax: [phone]     Social Determinants of Health (SDOH) Interventions    Readmission Risk Interventions Readmission Risk Prevention Plan 04/21/2020  Transportation Screening Complete  PCP or Specialist Appt within 5-7 Days Complete  Home Care Screening Complete  Medication Review (RN CM) Complete  Some recent data might be hidden

## 2020-07-31 NOTE — Progress Notes (Signed)
Per notes, pt has a PASSR.    [id number]  CSW will continue to follow for D/C needs.  Alphonse Guild. Ilani Otterson  MSW, LCSW, LCAS, CCS Transitions of Care Clinical Social Worker Care Coordination Department Ph: [phone]

## 2020-07-31 NOTE — ED Notes (Signed)
[facility] called for transport to [facility]

## 2020-07-31 NOTE — Telephone Encounter (Signed)
In ER.  Will await ER notes.  Thanks.

## 2020-07-31 NOTE — Telephone Encounter (Signed)
[place] Night - Client TELEPHONE ADVICE RECORD [facility] Patient Name: [name] Small Gender: Male DOB: 1928-02-08 Age: 86 Y 33 M 23 D Return Phone Number: [phone] (Primary), [phone] (Secondary) Address: [place] [address] Client [place] Primary [place] Night - Client Client Site [place] Physician Renford Dills - MD Contact Type Call Who Is Calling Patient / Member / Family / Caregiver Call Type Triage / Clinical Caller Name Rayder Sullenger Relationship To Patient Spouse Return Phone Number [phone] (Primary) Chief Complaint Blood Pressure High Reason for Call Symptomatic / Request for Health Information Initial Comment The pts blood pressure is running high (BP132/52 and BPM- 82). He has a runny nose and elevated temperate. He has had to urinate every two hours. Translation No Nurse Assessment Nurse: Genelle Gather, RN, Magda Paganini Date/Time (Eastern Time): 07/29/2020 3:13:51 PM Confirm and document reason for call. If symptomatic, describe symptoms. ---The pts blood pressure is running high (BP- 132/52 and BPM89). He has a runny nose as well. He has had to urinate every two hours. He had appt with urologist Thursday and increased urgency and hx of bladder CA. Wife was instructed to cath husband as needed. Has the patient had close contact with a person known or suspected to have the novel coronavirus illness OR traveled / lives in area with major community spread (including international travel) in the last 14 days from the onset of symptoms? * If Asymptomatic, screen for exposure and travel within the last 14 days. ---No Does the patient have any new or worsening symptoms? ---Yes Will a triage be completed? ---Yes Related visit to physician within the last 2 weeks? ---Yes Does the PT have any chronic conditions? (i.e. diabetes, asthma, this includes High risk factors for pregnancy,  etc.) ---Yes List chronic conditions. ---Kidney Disease, COPD, DM Is this a behavioral health or substance abuse call? ---No Guidelines Guideline Title Affirmed Question Affirmed Notes Nurse Date/Time Eilene Ghazi Time) Urination Pain - Male Diabetes mellitus or weak immune system (e.g., Huff[place], RN, Magda Paganini 07/29/2020 3:21:59 PMPLEASE NOTE: All timestamps contained within this report are represented as [place] Standard Time. CONFIDENTIALTY NOTICE: This fax transmission is intended only for the addressee. It contains information that is legally privileged, confidential or otherwise protected from use or disclosure. If you are not the intended recipient, you are strictly prohibited from reviewing, disclosing, copying using or disseminating any of this information or taking any action in reliance on or regarding this information. If you have received this fax in error, please notify us immediately by telephone so that we can arrange for its return to [place]. Phone: [phone], Toll-Free: [phone], Fax: [phone] Page: 2 of 2 Call Id: [id number] Guidelines Guideline Title Affirmed Question Affirmed Notes Nurse Date/Time Eilene Ghazi Time) HIV positive, cancer chemo, splenectomy, organ transplant, chronic steroids) Disp. Time Eilene Ghazi Time) Disposition Final User 07/29/2020 3:23:47 PM See HCP within 4 Hours (or PCP triage) Yes Genelle Gather, RN, Christa See Disagree/Comply Disagree Caller Understands Yes PreDisposition Call Doctor Care Advice Given Per Guideline SEE HCP (OR PCP TRIAGE) WITHIN 4 HOURS: * IF OFFICE WILL BE OPEN: You need to be seen within the next 3 or 4 hours. Call your doctor (or NP/PA) now or as soon as the office opens. BRING MEDS: * Be certain to bring your medications with you when you go to see the doctor. CALL BACK IF: * You become worse Referrals GO TO FACILITY REFUSED

## 2020-07-31 NOTE — H&P (Signed)
History and Physical        Hospital Admission Note Date: 07/31/2020  Patient name: [name] Small Medical record number: [id number] Date of birth: 1928-01-28 Age: 86 y.o. Gender: male  PCP: Tonia Ghent, MD  Patient coming from: Home Lives with: Wife At baseline, ambulates: Psychiatric nurse Complaint  Patient presents with  . Fall  . Leg Pain      HPI:   This is an 86 year old male with past medical history of HFpEF, CAD, CKD 3, COPD, recurrent UTIs, left femur fracture repair in June, diabetes who presented to the ED with a chief complaint of a fall and left leg pain.  He had been feeling unwell lately with increased urinary urgency over the weekend.  He had a fall on Saturday with subsequent left hip pain but did not come straight to the ED.  Today his occupational therapist was evaluating him and recommended that he come to the ED.  He has had significant pain in his left leg since.  No loss of consciousness, no head trauma.  He is not on any blood thinners.  No other pain.  Of note he was found to have a multidrug-resistant E. coli UTI on 9/1 and was prescribed Keflex and found to have recurrent UTI recently at the urology office and was supposed to start Keflex today.  He does admit to urinary urgency and occasionally urinary retention.  ED Course: Afebrile hemodynamically stable on room air.  Notable labs: Glucose 150, creatinine 1.94 (baseline 1.25 in July), WBC 12.1.  Left femur x-ray: New mildly angulated oblique fracture involving the distal left femoral shaft around the 2 most distal screws of the previously placed internal fixation plate.  ED physician discussed with Dr. [name], orthopedics, who recommended admission/transfer to [place]:   07/31/20 1258 07/31/20 1604  BP: 130/68 137/64  Pulse: 85 80  Resp: 20 18  Temp:     SpO2: 95% 93%     Review of Systems:  Review of Systems  Constitutional: Positive for malaise/fatigue. Negative for chills and fever.  HENT: Negative.   Respiratory: Negative for shortness of breath.   Cardiovascular: Negative for chest pain and palpitations.  Gastrointestinal: Negative for nausea and vomiting.  Genitourinary: Positive for urgency. Negative for hematuria.  Musculoskeletal: Positive for falls and joint pain.  Skin: Negative.   Neurological: Positive for weakness.  All other systems reviewed and are negative.   Medical/Social/Family History   Past Medical History: Past Medical History:  Diagnosis Date  . (HFpEF) heart failure with preserved ejection fraction ([place])   . Arthritis   . Back pain    s/p lumbar injection 2014  . BENIGN PROSTATIC HYPERTROPHY 05/21/2007  . Bladder cancer ([place]) 10/2011  . CAD (coronary artery disease)    nonobstructive by cath 6/12:  mid LAD 30%, proximal obtuse marginal-2 30%, proximal RCA 20%, mid RCA 30-40%.  He had normal cardiac output and mildly elevated filling pressures but no significant pulmonary hypertension;   Echocardiogram in May 2012 demonstrated EF 50-55% and left atrial enlargement   . Cellulitis of left foot    Hospitalized in 2006  . Chronic kidney disease (CKD),  stage III (moderate) 12/04/2007   FOLLOWED BY DR PATEL  . Chronic pain of lower extremity   . Complication of anesthesia    1 time bladder cancer surgery- 2012 , oxygen satruratioon dropped had to stay overnight, no problems since then.  Marland Kitchen COPD (chronic obstructive pulmonary disease) ([place])   . DIABETES MELLITUS, TYPE II 05/21/2007  . DIVERTICULOSIS, COLON 05/21/2007   pt denies  . Dyspnea    with exertion, patient mointors saturation  . Fatigue AGE-RELATED  . Gross hematuria    pt denies  . HYPERTENSION 05/21/2007  . Impaired hearing BILATERAL HEARING AIDS  . On home oxygen therapy    as needed  . PAD (peripheral artery disease) ([place])    a. 02/2016 L  foot nonhealing ulcer-->Periph angio: L pop 100 w/ reconstitution via extensive vollats to the prox-mid peroneal (only patent vessel BK)-->PTA of L Pop and peroneal; b. Ongoing LE ischemia 5 & 06/2016 req amputation of toes on L foot.  . Pneumonia    Hx  of   . Psoriasis ELBOWS  . Psoriasis   . PSORIASIS, SCALP 10/25/2008    Past Surgical History:  Procedure Laterality Date  . AMPUTATION Left 03/27/2016   Procedure: partial first AMPUTATION RAY; LEFT;  Surgeon: Trula Slade, DPM;  Location: [facility];  Service: Podiatry;  Laterality: Left;  . AMPUTATION Left 06/12/2016   Procedure: TRANSMETATARSAL AMPUTATION LEFT FOOT;  Surgeon: Newt Minion, MD;  Location: [facility];  Service: Orthopedics;  Laterality: Left;  . APPENDECTOMY  1941  . BIOPSY  05/16/2020   Procedure: BIOPSY;  Surgeon: Lavena Bullion, DO;  Location: [facility];  Service: Gastroenterology;;  . CARDIAC CATHETERIZATION  04-24-11/  DR Rogue Jury ARIDA   MILD NONOBSTRUCTIVE CAD, NORMA CARDIAC OUTPUT  . CARDIOVASCULAR STRESS TEST  2007  . CATARACT EXTRACTION W/ INTRAOCULAR LENS  IMPLANT, BILATERAL Bilateral ~ 2010  . CYSTOSCOPY  12/25/2011   Procedure: CYSTOSCOPY;  Surgeon: Molli Hazard, MD;  Location: [facility];  Service: Urology;  Laterality: N/A;  needs intubation and to be paralyzed   . CYSTOSCOPY W/ RETROGRADES  10/30/2011   Procedure: CYSTOSCOPY WITH RETROGRADE PYELOGRAM;  Surgeon: Molli Hazard, MD;  Location[facility];  Service: Urology;  Laterality: Bilateral;  CYSTOSCOPY POSS TURBT BILATERAL RETROGRADE PYLEOGRAM   . CYSTOSCOPY W/ RETROGRADES  11/30/2012   Procedure: CYSTOSCOPY WITH RETROGRADE PYELOGRAM;  Surgeon: Molli Hazard, MD;  Location: [facility];  Service: Urology;  Laterality: Bilateral;  Flexible cystoscopy.  . CYSTOSCOPY WITH BIOPSY  11/30/2012   Procedure: CYSTOSCOPY WITH BIOPSY;  Surgeon: Molli Hazard, MD;  Location: [facility];  Service: Urology;  Laterality: N/A;  . ESOPHAGOGASTRODUODENOSCOPY (EGD) WITH PROPOFOL N/A 05/16/2020   Procedure: ESOPHAGOGASTRODUODENOSCOPY (EGD) WITH PROPOFOL;  Surgeon: Lavena Bullion, DO;  [facility];  Service: Gastroenterology;  Laterality: N/A;  . INCISION AND DRAINAGE FOOT Left ~ 2005   LEFT FOOT DUE TO INFECTION FROM  NAIL PUNCTURE INJURY  . LUMBAR LAMINECTOMY/DECOMPRESSION MICRODISCECTOMY N/A 10/18/2016   Procedure: LEFT AND CENTRAL L4-5 LUMBAR LAMINECTOMY WITH RESECTION OF SYNOVIAL CYST;  Surgeon: Jessy Oto, MD;  Location: [facility];  Service: Orthopedics;  Laterality: N/A;  . ORIF FEMUR FRACTURE Left 04/19/2020   Procedure: OPEN REDUCTION INTERNAL FIXATION FEMORAL SHAFT FRACTURE;  Surgeon: Shona Needles, MD;  Location: [facility];  Service: Orthopedics;  Laterality: Left;  . PENILE PROSTHESIS IMPLANT  1990s  . PERIPHERAL VASCULAR CATHETERIZATION N/A  02/14/2016   Procedure: Abdominal Aortogram w/Lower Extremity;  Surgeon: Wellington Hampshire, MD;  Location: [facility];  Service: Cardiovascular;  Laterality: N/A;  . PERIPHERAL VASCULAR CATHETERIZATION Left 02/28/2016   Procedure: Peripheral Vascular Balloon Angioplasty;  Surgeon: Wellington Hampshire, MD;  Location: [facility];  Service: Cardiovascular;  Laterality: Left;  left peroneal and popliteal artery  . [place]  . TOTAL HIP ARTHROPLASTY Left 12/27/2016   Procedure: LEFT TOTAL HIP ARTHROPLASTY ANTERIOR APPROACH;  Surgeon: Mcarthur Rossetti, MD;  Location: [facility];  Service: Orthopedics;  Laterality: Left;  . TRANSURETHRAL RESECTION OF BLADDER TUMOR  10/30/2011   Procedure: TRANSURETHRAL RESECTION OF BLADDER TUMOR (TURBT);  Surgeon: Molli Hazard, MD;  Location: [facility];  Service: Urology;  Laterality: N/A;  . TRANSURETHRAL RESECTION OF BLADDER TUMOR  12/25/2011   Procedure: TRANSURETHRAL RESECTION OF BLADDER TUMOR (TURBT);  Surgeon: Molli Hazard, MD;  Location: [facility];  Service: Urology;  Laterality: N/A;  need long gyrus instruments   . VASECTOMY  1990s    Medications: Prior to Admission medications   Medication Sig Start Date End Date Taking? Authorizing Provider  acetaminophen (TYLENOL) 325 MG tablet Take 1-2 tablets (325-650 mg total) by mouth every 4 (four) hours as needed for mild pain. 05/11/20  Yes Angiulli, Lavon Paganini, PA-C  Apremilast 30 MG TABS Take 1 tablet by mouth daily. Patient taking differently: Take 30 mg by mouth daily.  03/30/19  Yes Tonia Ghent, MD  atorvastatin (LIPITOR) 10 MG tablet Take 1 tablet (10 mg total) by mouth daily. 05/11/20  Yes Angiulli, Lavon Paganini, PA-C  budesonide-formoterol (SYMBICORT) 160-4.5 MCG/ACT inhaler Inhale 2 puffs into the lungs 2 (two) times daily as needed (shortness of breath). 05/11/20  Yes Angiulli, Lavon Paganini, PA-C  clobetasol (TEMOVATE) 0.05 % external solution Apply 1 application topically daily as needed (rash).    Yes [provider]  docusate sodium (COLACE) 100 MG capsule Take 1 capsule (100 mg total) by mouth 3 (three) times daily. Patient taking differently: Take 100 mg by mouth daily as needed for moderate constipation.  05/11/20  Yes Angiulli, Lavon Paganini, PA-C  escitalopram (LEXAPRO) 5 MG tablet Take 1 tablet (5 mg total) by mouth daily. 05/11/20  Yes Angiulli, Lavon Paganini, PA-C  Homeopathic Products (MURINE EAR WAX RELIEF) SOLN Place 10 drops in ear(s) once a week.   Yes [provider]  insulin glargine (LANTUS) 100 UNIT/ML Solostar Pen Inject 10 Units into the skin 2 (two) times daily. 05/12/20  Yes Angiulli, Lavon Paganini, PA-C  Multiple Vitamin (MULTIVITAMIN WITH MINERALS) TABS tablet Take 1 tablet by mouth daily. 05/11/20  Yes Angiulli, Lavon Paganini, PA-C  omeprazole (PRILOSEC) 40 MG capsule Take 1 capsule (40 mg total) by mouth daily. 06/11/20  Yes Tonia Ghent, MD  polyethylene glycol (MIRALAX / GLYCOLAX) 17 g packet Take 17 g by mouth  daily. Patient taking differently: Take 17 g by mouth daily as needed for severe constipation.  05/11/20  Yes Angiulli, Lavon Paganini, PA-C  senna (SENOKOT) 8.6 MG TABS tablet Take 2 tablets (17.2 mg total) by mouth at bedtime. Patient taking differently: Take 2 tablets by mouth at bedtime as needed for mild constipation.  05/11/20  Yes Angiulli, Lavon Paganini, PA-C  tamsulosin (FLOMAX) 0.4 MG CAPS capsule Take 2 capsules (0.8 mg total) by mouth daily after supper. 05/23/20  Yes Tonia Ghent, MD  tiotropium (SPIRIVA) 18 MCG inhalation capsule Place  1 capsule (18 mcg total) into inhaler and inhale daily. 05/11/20  Yes Angiulli, Lavon Paganini, PA-C  traMADol (ULTRAM) 50 MG tablet Take 1 tablet (50 mg total) by mouth every 6 (six) hours. Patient taking differently: Take 50 mg by mouth every 6 (six) hours as needed for moderate pain.  05/26/20  Yes Raulkar, Clide Deutscher, MD  calcium carbonate (OS-CAL - DOSED IN MG OF ELEMENTAL CALCIUM) 1250 (500 Ca) MG tablet Take 1 tablet (500 mg of elemental calcium total) by mouth daily with breakfast. Patient not taking: Reported on 06/09/2020 05/11/20   Angiulli, Lavon Paganini, PA-C  cephALEXin (KEFLEX) 500 MG capsule Take 1 capsule (500 mg total) by mouth 2 (two) times daily. Patient not taking: Reported on 07/31/2020 07/11/20   Tonia Ghent, MD  HYDROcodone-acetaminophen (NORCO/VICODIN) 5-325 MG tablet Take 1 tablet by mouth every 6 (six) hours as needed for moderate pain. Patient not taking: Reported on 06/09/2020 05/26/20   Raulkar, Clide Deutscher, MD  melatonin 5 MG TABS Take 2 tablets (10 mg total) by mouth at bedtime as needed (sleep). Patient not taking: Reported on 06/09/2020 05/20/20   Carmin Muskrat, MD  methocarbamol (ROBAXIN) 500 MG tablet Take 1 tablet (500 mg total) by mouth every 6 (six) hours as needed for muscle spasms. Patient not taking: Reported on 06/09/2020 05/11/20   Angiulli, Lavon Paganini, PA-C  umeclidinium bromide (INCRUSE ELLIPTA) 62.5 MCG/INH AEPB Inhale 1 puff into the lungs  daily. Patient not taking: Reported on 07/31/2020 05/11/20   Angiulli, Lavon Paganini, PA-C  iron polysaccharides (NIFEREX) 150 MG capsule Take 1 capsule (150 mg total) by mouth daily after supper. 05/11/20 05/20/20  Angiulli, Lavon Paganini, PA-C    Allergies:   Allergies  Allergen Reactions  . Actos [Pioglitazone Hydrochloride] Other (See Comments)    Held 2012 bladder cancer  . Aspirin Other (See Comments)    Held 2012 due to hematuria and bruising.   . Ace Inhibitors Cough  . Bactrim Rash and Other (See Comments)    Rash, presumed allergy  . Gabapentin Nausea And Vomiting    Upset stomach and diarrhea - tolerates low doses  . Lyrica [Pregabalin] Swelling    swelling  . Pravastatin Swelling    Swelling of feet  . Sulfa Drugs Cross Reactors Rash    Social History:  reports that he quit smoking about 51 years ago. His smoking use included cigarettes. He has a 44.00 pack-year smoking history. He has never used smokeless tobacco. He reports current alcohol use. He reports that he does not use drugs.  Family History: Family History  Problem Relation Age of Onset  . Diabetes Mother   . Heart disease Brother   . Heart disease Brother   . Heart disease Brother   . Heart disease Brother   . Heart disease Brother   . Heart disease Brother   . Lung cancer Brother      Objective   Physical Exam: Blood pressure 137/64, pulse 80, temperature (!) 97.2 F (36.2 C), temperature source Oral, resp. rate 18, height 6\' 2"  (1.88 m), weight 95.3 kg, SpO2 93 %.  Physical Exam Vitals and nursing note reviewed.  Constitutional:      Appearance: Normal appearance.  HENT:     Head: Normocephalic and atraumatic.  Eyes:     Conjunctiva/sclera: Conjunctivae normal.  Cardiovascular:     Rate and Rhythm: Normal rate and regular rhythm.  Pulmonary:     Effort: Pulmonary effort is normal.     Breath sounds:  Normal breath sounds.  Abdominal:     General: Abdomen is flat.     Palpations: Abdomen is soft.   Musculoskeletal:     Comments: Left foot with all digits amputated Left hip pain  Skin:    Coloration: Skin is not jaundiced or pale.  Neurological:     Mental Status: He is alert. Mental status is at baseline.  Psychiatric:        Mood and Affect: Mood normal.        Behavior: Behavior normal.     LABS on Admission: I have personally reviewed all the labs and imaging below    Basic Metabolic Panel: Recent Labs  Lab 07/31/20 1420  NA 137  K 4.7  CL 99  CO2 30  GLUCOSE 150*  BUN 46*  CREATININE 1.94*  CALCIUM 8.4*   Liver Function Tests: Recent Labs  Lab 07/31/20 1420  AST 14*  ALT 11  ALKPHOS 80  BILITOT 0.5  PROT 7.4  ALBUMIN 3.3*   No results for input(s): LIPASE, AMYLASE in the last 168 hours. No results for input(s): AMMONIA in the last 168 hours. CBC: Recent Labs  Lab 07/31/20 1420  WBC 12.1*  HGB 12.7*  HCT 40.1  MCV 98.8  PLT 305   Cardiac Enzymes: No results for input(s): CKTOTAL, CKMB, CKMBINDEX, TROPONINI in the last 168 hours. BNP: Invalid input(s): POCBNP CBG: No results for input(s): GLUCAP in the last 168 hours.  Radiological Exams on Admission:  DG Pelvis 1-2 Views  Result Date: 07/31/2020 CLINICAL DATA:  Pelvic pain after fall. EXAM: PELVIS - 1-2 VIEW COMPARISON:  April 18, 2020. FINDINGS: Status post left total hip arthroplasty and surgical internal fixation of old proximal left femoral shaft fracture. No acute abnormality seen involving the right hip or pelvis. IMPRESSION: No acute abnormality seen in the pelvis. Electronically Signed   By: Marijo Conception M.D.   On: 07/31/2020 12:56   DG Tibia/Fibula Left  Result Date: 07/31/2020 CLINICAL DATA:  Left leg pain after fall. EXAM: LEFT TIBIA AND FIBULA - 2 VIEW COMPARISON:  None. FINDINGS: There is no evidence of fracture or other focal bone lesions. Soft tissues are unremarkable. IMPRESSION: Negative. Electronically Signed   By: Marijo Conception M.D.   On: 07/31/2020 12:51   DG Knee  Complete 4 Views Left  Result Date: 07/31/2020 CLINICAL DATA:  Left knee pain after fall. EXAM: LEFT KNEE - COMPLETE 4+ VIEW COMPARISON:  None. FINDINGS: No evidence of fracture, dislocation, or joint effusion. No evidence of arthropathy or other focal bone abnormality. Soft tissues are unremarkable. IMPRESSION: Negative. Electronically Signed   By: Marijo Conception M.D.   On: 07/31/2020 12:52   DG Femur Portable Min 2 Views Left  Result Date: 07/31/2020 CLINICAL DATA:  Left leg pain after fall EXAM: LEFT FEMUR PORTABLE 2 VIEWS COMPARISON:  May 11, 2020. FINDINGS: Status post left total hip arthroplasty and surgical internal fixation of old proximal left femoral shaft fracture. There is now noted new mildly angulated oblique fracture involving the distal femoral shaft around the 2 most distal screws of the fixation plate. IMPRESSION: New mildly angulated oblique fracture involving the distal left femoral shaft around the 2 most distal screws of the previously placed internal fixation plate. Electronically Signed   By: Marijo Conception M.D.   On: 07/31/2020 12:54      EKG: Independently reviewed.    A & P   Principal Problem:   Femur fracture, left ([place])  Active Problems:   Type 2 diabetes mellitus with diabetic polyneuropathy (HCC)   BPH (benign prostatic hyperplasia)   Acute kidney injury ([place])   Periprosthetic hip fracture   Recurrent UTI   1. Left femur fracture s/p mechanical fall with history of recent left femur fracture s/p ORIF by Dr. [name] on 04/19/2020 a. Orthopedic surgery consulted, plan to transfer to [facility] b. N.p.o. after midnight c. Pain management per Ortho d. Bowel regimen while on opiates  2. Recurrent UTI with history of multidrug-resistant E. coli UTI a. Unable to see records from his recent urology visit but was to start on Keflex today b. Continue Keflex  3. Suspected AKI on CKD 3b a. Creatinine 1.9 and was 1.25 in July has been steadily increasing b. Could be  from urinary retention c. Bladder scans and straight cath  4. Intermittent urinary retention with history of enlarged prostate  a. follows with urology b. Straight caths at home c. Continue tamsulosin d. Bladder scan and straight cath as needed  5. Generalized weakness, likely multifactorial: UTI, debility a. PT eval after his surgery  6. COPD, not in exacerbation a. Continue home Spiriva with Breo  7. HFpEF, not in exacerbation a. Daily weights and intake/output  8. Diabetes a. Decrease home Lantus to 5 units twice daily, monitor for hypoglycemia   DVT prophylaxis: heparin   Code Status: DNR  Diet: NPO after midnight Family Communication: Admission, patients condition and plan of care including tests being ordered have been discussed with the patient who indicates understanding and agrees with the plan and Code Status. Patient's wife was updated  Disposition Plan: The appropriate patient status for this patient is INPATIENT. Inpatient status is judged to be reasonable and necessary in order to provide the required intensity of service to ensure the patient's safety. The patient's presenting symptoms, physical exam findings, and initial radiographic and laboratory data in the context of their chronic comorbidities is felt to place them at high risk for further clinical deterioration. Furthermore, it is not anticipated that the patient will be medically stable for discharge from the hospital within 2 midnights of admission. The following factors support the patient status of inpatient.   " The patient's presenting symptoms include left hip pain, fall. " The worrisome physical exam findings include left hip pain. " The initial radiographic and laboratory data are worrisome because of left hip fracture. " The chronic co-morbidities include enlarged prostate, ckd 3b, HFpEF, diabetes, recurrent uti   * I certify that at the point of admission it is my clinical judgment that the patient  will require inpatient hospital care spanning beyond 2 midnights from the point of admission due to high intensity of service, high risk for further deterioration and high frequency of surveillance required.*   Status is: Inpatient  Remains inpatient appropriate because:Inpatient level of care appropriate due to severity of illness   Dispo: The patient is from: Home              Anticipated d/c is to: SNF              Anticipated d/c date is: 3 days              Patient currently is not medically stable to d/c.    Consultants  . ortho  Procedures  . none  Time Spent on Admission: 66 minutes    Harold Hedge, DO [facility] Pager [phone] 07/31/2020, 5:06 PM

## 2020-07-31 NOTE — ED Triage Notes (Signed)
Pt had a fall on Saturday, left leg gave out and having pain below knee since fall. Left femur fx repair in June still having swelling and discomfort from that. No obvious deformities only pain upon movement. Denies dizziness

## 2020-08-01 ENCOUNTER — Encounter (HOSPITAL_COMMUNITY): Payer: Self-pay | Admitting: Internal Medicine

## 2020-08-01 ENCOUNTER — Inpatient Hospital Stay (HOSPITAL_COMMUNITY): Payer: Medicare Other

## 2020-08-01 ENCOUNTER — Inpatient Hospital Stay (HOSPITAL_COMMUNITY): Payer: Medicare Other | Admitting: Certified Registered"

## 2020-08-01 ENCOUNTER — Encounter (HOSPITAL_COMMUNITY)
Admission: EM | Disposition: A | Discharge status: Skilled Nursing Facility | Payer: Self-pay | Source: Home / Self Care | Attending: Internal Medicine

## 2020-08-01 DIAGNOSIS — L8990 Pressure ulcer of unspecified site, unspecified stage: Secondary | ICD-10-CM

## 2020-08-01 HISTORY — PX: FEMUR IM NAIL: SHX1597

## 2020-08-01 LAB — GLUCOSE, CAPILLARY
Glucose-Capillary: 124 mg/dL — ABNORMAL HIGH (ref 70–99)
Glucose-Capillary: 180 mg/dL — ABNORMAL HIGH (ref 70–99)
Glucose-Capillary: 81 mg/dL (ref 70–99)
Glucose-Capillary: 97 mg/dL (ref 70–99)

## 2020-08-01 SURGERY — INSERTION, INTRAMEDULLARY ROD, FEMUR, RETROGRADE
Anesthesia: General | Site: Leg Upper | Laterality: Left

## 2020-08-01 MED ORDER — FENTANYL CITRATE (PF) 100 MCG/2ML IJ SOLN: 25.0000 ug | INTRAMUSCULAR | Status: DC | PRN

## 2020-08-01 MED ORDER — CHLORHEXIDINE GLUCONATE 0.12 % MT SOLN
15.0000 mL | Freq: Once | OROMUCOSAL | Status: AC
  Administered 2020-08-01: 15 mL via OROMUCOSAL
  Filled 2020-08-01: qty 15

## 2020-08-01 MED ORDER — SODIUM CHLORIDE 0.9 % IV SOLN: INTRAVENOUS | Status: DC

## 2020-08-01 MED ORDER — 0.9 % SODIUM CHLORIDE (POUR BTL) OPTIME
TOPICAL | Status: DC | PRN
  Administered 2020-08-01: 1000 mL

## 2020-08-01 MED ORDER — FENTANYL CITRATE (PF) 250 MCG/5ML IJ SOLN
INTRAMUSCULAR | Status: DC | PRN
Start: 2020-08-01 — End: 2020-08-01
  Administered 2020-08-01 (×4): 50 ug via INTRAVENOUS

## 2020-08-01 MED ORDER — DEXAMETHASONE SODIUM PHOSPHATE 10 MG/ML IJ SOLN
INTRAMUSCULAR | Status: DC | PRN
  Administered 2020-08-01: 10 mg via INTRAVENOUS

## 2020-08-01 MED ORDER — PROPOFOL 10 MG/ML IV BOLUS
INTRAVENOUS | Status: DC | PRN
  Administered 2020-08-01: 80 mg via INTRAVENOUS
  Administered 2020-08-01: 20 mg via INTRAVENOUS

## 2020-08-01 MED ORDER — ROCURONIUM BROMIDE 10 MG/ML (PF) SYRINGE
PREFILLED_SYRINGE | INTRAVENOUS | Status: AC
  Filled 2020-08-01: qty 10

## 2020-08-01 MED ORDER — ONDANSETRON HCL 4 MG/2ML IJ SOLN
INTRAMUSCULAR | Status: DC | PRN
  Administered 2020-08-01: 4 mg via INTRAVENOUS

## 2020-08-01 MED ORDER — PHENYLEPHRINE HCL-NACL 10-0.9 MG/250ML-% IV SOLN
INTRAVENOUS | Status: DC | PRN
  Administered 2020-08-01: 50 ug/min via INTRAVENOUS

## 2020-08-01 MED ORDER — ADULT MULTIVITAMIN W/MINERALS CH
1.0000 | ORAL_TABLET | Freq: Every day | ORAL | Status: DC
  Administered 2020-08-02 – 2020-08-08 (×7): 1 via ORAL
  Filled 2020-08-01 (×7): qty 1

## 2020-08-01 MED ORDER — ENSURE MAX PROTEIN PO LIQD
11.0000 [oz_av] | Freq: Every day | ORAL | Status: DC
  Administered 2020-08-02 – 2020-08-07 (×5): 11 [oz_av] via ORAL
  Filled 2020-08-01: qty 330

## 2020-08-01 MED ORDER — EPHEDRINE SULFATE-NACL 50-0.9 MG/10ML-% IV SOSY
PREFILLED_SYRINGE | INTRAVENOUS | Status: DC | PRN
  Administered 2020-08-01 (×2): 10 mg via INTRAVENOUS
  Administered 2020-08-01 (×2): 20 mg via INTRAVENOUS

## 2020-08-01 MED ORDER — ALBUMIN HUMAN 5 % IV SOLN: INTRAVENOUS | Status: DC | PRN

## 2020-08-01 MED ORDER — LIDOCAINE 2% (20 MG/ML) 5 ML SYRINGE
INTRAMUSCULAR | Status: AC
  Filled 2020-08-01: qty 5

## 2020-08-01 MED ORDER — LIDOCAINE 2% (20 MG/ML) 5 ML SYRINGE
INTRAMUSCULAR | Status: DC | PRN
  Administered 2020-08-01: 100 mg via INTRAVENOUS

## 2020-08-01 MED ORDER — ROCURONIUM BROMIDE 10 MG/ML (PF) SYRINGE
PREFILLED_SYRINGE | INTRAVENOUS | Status: DC | PRN
  Administered 2020-08-01 (×2): 50 mg via INTRAVENOUS

## 2020-08-01 MED ORDER — SUGAMMADEX SODIUM 200 MG/2ML IV SOLN
INTRAVENOUS | Status: DC | PRN
  Administered 2020-08-01: 400 mg via INTRAVENOUS

## 2020-08-01 MED ORDER — GLUCERNA SHAKE PO LIQD
237.0000 mL | Freq: Two times a day (BID) | ORAL | Status: DC
  Administered 2020-08-02 – 2020-08-07 (×10): 237 mL via ORAL

## 2020-08-01 MED ORDER — PROPOFOL 10 MG/ML IV BOLUS
INTRAVENOUS | Status: AC
  Filled 2020-08-01: qty 20

## 2020-08-01 MED ORDER — ONDANSETRON HCL 4 MG/2ML IJ SOLN
INTRAMUSCULAR | Status: AC
  Filled 2020-08-01: qty 2

## 2020-08-01 MED ORDER — CEPHALEXIN 500 MG PO CAPS
500.0000 mg | ORAL_CAPSULE | Freq: Two times a day (BID) | ORAL | Status: DC
  Administered 2020-08-01: 500 mg via ORAL
  Filled 2020-08-01: qty 1

## 2020-08-01 MED ORDER — DEXAMETHASONE SODIUM PHOSPHATE 10 MG/ML IJ SOLN
INTRAMUSCULAR | Status: AC
  Filled 2020-08-01: qty 1

## 2020-08-01 MED ORDER — ENOXAPARIN SODIUM 30 MG/0.3ML ~~LOC~~ SOLN
30.0000 mg | SUBCUTANEOUS | Status: DC
  Administered 2020-08-02 – 2020-08-04 (×3): 30 mg via SUBCUTANEOUS
  Filled 2020-08-01 (×4): qty 0.3

## 2020-08-01 MED ORDER — FENTANYL CITRATE (PF) 250 MCG/5ML IJ SOLN
INTRAMUSCULAR | Status: AC
  Filled 2020-08-01: qty 5

## 2020-08-01 SURGICAL SUPPLY — 51 items
BIT DRILL CALIBRATED 4.3MMX365 (DRILL) IMPLANT
BIT DRILL CROWE PNT TWST 4.5MM (DRILL) IMPLANT
BNDG COHESIVE 4X5 TAN STRL (GAUZE/BANDAGES/DRESSINGS) ×2 IMPLANT
BNDG ELASTIC 4X5.8 VLCR STR LF (GAUZE/BANDAGES/DRESSINGS) ×2 IMPLANT
BNDG ELASTIC 6X5.8 VLCR STR LF (GAUZE/BANDAGES/DRESSINGS) ×2 IMPLANT
BRUSH SCRUB EZ PLAIN DRY (MISCELLANEOUS) ×4 IMPLANT
COVER MAYO STAND STRL (DRAPES) ×2 IMPLANT
COVER SURGICAL LIGHT HANDLE (MISCELLANEOUS) ×3 IMPLANT
COVER WAND RF STERILE (DRAPES) ×2 IMPLANT
DRAPE C-ARM 42X72 X-RAY (DRAPES) ×2 IMPLANT
DRAPE C-ARMOR (DRAPES) ×2 IMPLANT
DRAPE IMP U-DRAPE 54X76 (DRAPES) ×2 IMPLANT
DRAPE INCISE IOBAN 66X45 STRL (DRAPES) ×2 IMPLANT
DRAPE ORTHO SPLIT 77X108 STRL (DRAPES) ×4
DRAPE SURG ORHT 6 SPLT 77X108 (DRAPES) ×2 IMPLANT
DRAPE U-SHAPE 47X51 STRL (DRAPES) ×2 IMPLANT
DRILL CALIBRATED 4.3MMX365 (DRILL) ×2
DRILL CROWE POINT TWIST 4.5MM (DRILL) ×2
DRSG MEPILEX BORD LITE 2X5 (GAUZE/BANDAGES/DRESSINGS) ×1 IMPLANT
DRSG MEPILEX BORDER 4X4 (GAUZE/BANDAGES/DRESSINGS) ×1 IMPLANT
ELECT REM PT RETURN 9FT ADLT (ELECTROSURGICAL) ×2
ELECTRODE REM PT RTRN 9FT ADLT (ELECTROSURGICAL) ×1 IMPLANT
GLOVE BIO SURGEON STRL SZ7.5 (GLOVE) ×2 IMPLANT
GLOVE BIO SURGEON STRL SZ8 (GLOVE) ×2 IMPLANT
GLOVE BIOGEL PI IND STRL 7.5 (GLOVE) ×1 IMPLANT
GLOVE BIOGEL PI IND STRL 8 (GLOVE) ×1 IMPLANT
GLOVE BIOGEL PI INDICATOR 7.5 (GLOVE) ×1
GLOVE BIOGEL PI INDICATOR 8 (GLOVE) ×1
GOWN STRL REUS W/ TWL LRG LVL3 (GOWN DISPOSABLE) ×2 IMPLANT
GOWN STRL REUS W/ TWL XL LVL3 (GOWN DISPOSABLE) ×1 IMPLANT
GOWN STRL REUS W/TWL LRG LVL3 (GOWN DISPOSABLE) ×4
GOWN STRL REUS W/TWL XL LVL3 (GOWN DISPOSABLE) ×2
GUIDEPIN 3.2X17.5 THRD DISP (PIN) ×1 IMPLANT
GUIDEWIRE BEAD TIP (WIRE) ×1 IMPLANT
KIT BASIN OR (CUSTOM PROCEDURE TRAY) ×2 IMPLANT
KIT TURNOVER KIT B (KITS) ×2 IMPLANT
NAIL FEM RETRO 12X260 (Nail) ×1 IMPLANT
PACK GENERAL/GYN (CUSTOM PROCEDURE TRAY) ×1 IMPLANT
PACK UNIVERSAL I (CUSTOM PROCEDURE TRAY) ×2 IMPLANT
SCREW CANN NCB PA 6.2X4.4/5 (Screw) ×2 IMPLANT
SCREW CORT TI DBL LEAD 5X36 (Screw) ×2 IMPLANT
SCREW CORT TI DBL LEAD 5X60 (Screw) ×1 IMPLANT
SCREW CORT TI DBL LEAD 5X70 (Screw) ×1 IMPLANT
SCREW CORT TI DBL LEAD 5X80 (Screw) ×1 IMPLANT
SCREW CORTICAL 5.0X10MM (Screw) ×2 IMPLANT
SPONGE LAP 18X18 RF (DISPOSABLE) ×2 IMPLANT
STAPLER VISISTAT 35W (STAPLE) ×2 IMPLANT
STOCKINETTE IMPERVIOUS LG (DRAPES) ×2 IMPLANT
TOWEL GREEN STERILE (TOWEL DISPOSABLE) ×4 IMPLANT
TOWEL GREEN STERILE FF (TOWEL DISPOSABLE) ×2 IMPLANT
YANKAUER SUCT BULB TIP NO VENT (SUCTIONS) ×1 IMPLANT

## 2020-08-01 NOTE — Anesthesia Procedure Notes (Signed)
Procedure Name: Intubation Date/Time: 08/01/2020 2:45 PM Performed by: Lance Coon, CRNA Pre-anesthesia Checklist: Patient identified, Emergency Drugs available, Suction available, Patient being monitored and Timeout performed Patient Re-evaluated:Patient Re-evaluated prior to induction Oxygen Delivery Method: Circle system utilized Preoxygenation: Pre-oxygenation with 100% oxygen Induction Type: IV induction Ventilation: Mask ventilation without difficulty and Oral airway inserted - appropriate to patient size Laryngoscope Size: Miller and 3 Grade View: Grade I Tube type: Oral Tube size: 7.5 mm Number of attempts: 1 Airway Equipment and Method: Stylet Placement Confirmation: ETT inserted through vocal cords under direct vision,  positive ETCO2 and breath sounds checked- equal and bilateral Secured at: 21 cm Tube secured with: Tape Dental Injury: Teeth and Oropharynx as per pre-operative assessment

## 2020-08-01 NOTE — Consult Note (Signed)
I have been asked to see the patient by Dr. Altamese Seneca, for evaluation and management of urinary retention and UTI.  History of present illness: 1: Bladder cancer: 10/2011: UCC, high-grade non-muscle invasive. Re-TURBT 12/25/2011- negative.  BCG induction completed 04/2012. BCG maintenance: 07/2012, 01/2013. Imaging: B-RPG 11/2012- negative.   #2: Urinary retention:  The in 2018 the patient developed urinary retention after hip surgery and for quite a long time did have a Foley catheter. Ultimately did pass his voiding trial. He then had trouble with urinary retention after the most recent hospitalization which was a result of a fall and a femur fracture. He was cathing 4 times daily. But ultimately was able to begin voiding on his own. His however, he has been treated for 3 urinary tract infections over the last month.   His PVR in the office was 550.  #3.: Gross Hematuria: While in the hospital recently the patient was seen for gross hematuria. He had a CT scan that was largely unremarkable. The bleeding has since subsided. Cystoscopy performed last week demonstrated no cancer, but diffuse erythema consistant wit chronic infection.   Intv: Patient was seen in our office Thursday afternoon.  A urine culture at that time was obtained and resulted over the weekend.  On Monday morning he was called in doxycycline for his infection.  Shortly after that he fell and refractured his femur.  We did perform cystoscopy for his gross hematuria which demonstrated diffuse erythema but no evidence of recurrence of his bladder cancer.  His postvoid residual in our clinic was noted to be approximately 550 mL.  I suggested that they start performing clean intermittent catheterization twice daily because of his elevated postvoid residuals as well.    Review of systems: A 12 point comprehensive review of systems was obtained and is negative unless otherwise stated in the history of present illness.  Patient Active  Problem List   Diagnosis Date Noted   Pressure injury of skin 08/01/2020   Recurrent UTI 07/31/2020   Insomnia 05/25/2020   Advance care planning    Palliative care by specialist    Gastritis and gastroduodenitis    Gastroesophageal reflux disease with esophagitis without hemorrhage    Ulcer of esophagus without bleeding    GI bleed 05/14/2020   Urinary retention with incomplete bladder emptying 05/10/2020   Adjustment reaction of late life    Femur fracture, left ([place]) 04/26/2020   Periprosthetic hip fracture 04/26/2020   Acute kidney injury ([place])    Closed left subtrochanteric femur fracture, initial encounter ([place])    Acute blood loss anemia    Gross hematuria    Controlled type 2 diabetes mellitus with hyperglycemia, with long-term current use of insulin ([place])    Benign essential HTN    Post-operative pain    Closed left hip fracture, initial encounter ([place]) 04/18/2020   Anxiety 12/15/2019   Laceration of right thumb without foreign body with damage to nail 12/15/2019   Anemia 01/31/2017   Hypoxia    History of hip replacement 12/26/2016    Class: Chronic   Status post lumbar laminectomy 12/26/2016    Class: Chronic   S/p left hip fracture 12/26/2016   Idiopathic chronic venous hypertension of left lower extremity with inflammation 11/07/2016   Spinal stenosis of lumbar region with neurogenic claudication 10/18/2016   Spinal stenosis of lumbar region 09/25/2016   Fall 07/04/2016   COPD (chronic obstructive pulmonary disease) ([place]) 06/10/2016   Weakness generalized 03/18/2016   Critical lower limb  ischemia 02/28/2016   Peripheral arterial disease ([place]) 10/17/2015   Edema 07/19/2015   Claudication ([place]) 01/31/2015   DOE (dyspnea on exertion) 01/31/2015   Hyperlipidemia 01/31/2015   HTN (hypertension) 01/31/2015   Chronic diastolic CHF (congestive heart failure) ([place]) 01/21/2014   Atrial fibrillation ([place]) 12/16/2012   Hyperkalemia 11/27/2011   Bladder cancer ([place])  09/22/2011   Leg cramps 08/28/2011   Cough 05/26/2011   CAD (coronary artery disease) of artery bypass graft 05/09/2011   Dyspnea on exertion 04/19/2011   PSORIASIS, SCALP 10/25/2008   Chronic kidney disease, stage III (moderate) 12/04/2007   Type 2 diabetes mellitus with diabetic polyneuropathy ([place]) 05/21/2007   DIVERTICULOSIS, COLON 05/21/2007   BPH (benign prostatic hyperplasia) 05/21/2007    No current facility-administered medications on file prior to encounter.   Current Outpatient Medications on File Prior to Encounter  Medication Sig Dispense Refill   acetaminophen (TYLENOL) 325 MG tablet Take 1-2 tablets (325-650 mg total) by mouth every 4 (four) hours as needed for mild pain.     Apremilast 30 MG TABS Take 1 tablet by mouth daily. (Patient taking differently: Take 30 mg by mouth daily. )     atorvastatin (LIPITOR) 10 MG tablet Take 1 tablet (10 mg total) by mouth daily. 90 tablet 3   budesonide-formoterol (SYMBICORT) 160-4.5 MCG/ACT inhaler Inhale 2 puffs into the lungs 2 (two) times daily as needed (shortness of breath). 1 Inhaler 12   clobetasol (TEMOVATE) 0.05 % external solution Apply 1 application topically daily as needed (rash).      docusate sodium (COLACE) 100 MG capsule Take 1 capsule (100 mg total) by mouth 3 (three) times daily. (Patient taking differently: Take 100 mg by mouth daily as needed for moderate constipation. ) 10 capsule 0   escitalopram (LEXAPRO) 5 MG tablet Take 1 tablet (5 mg total) by mouth daily. 90 tablet 1   Homeopathic Products (MURINE EAR WAX RELIEF) SOLN Place 10 drops in ear(s) once a week.     insulin glargine (LANTUS) 100 UNIT/ML Solostar Pen Inject 10 Units into the skin 2 (two) times daily. 15 mL 11   Multiple Vitamin (MULTIVITAMIN WITH MINERALS) TABS tablet Take 1 tablet by mouth daily.     omeprazole (PRILOSEC) 40 MG capsule Take 1 capsule (40 mg total) by mouth daily. 30 capsule 5   polyethylene glycol (MIRALAX / GLYCOLAX) 17 g packet  Take 17 g by mouth daily. (Patient taking differently: Take 17 g by mouth daily as needed for severe constipation. ) 14 each 0   senna (SENOKOT) 8.6 MG TABS tablet Take 2 tablets (17.2 mg total) by mouth at bedtime. (Patient taking differently: Take 2 tablets by mouth at bedtime as needed for mild constipation. ) 120 tablet 0   tamsulosin (FLOMAX) 0.4 MG CAPS capsule Take 2 capsules (0.8 mg total) by mouth daily after supper. 60 capsule 3   tiotropium (SPIRIVA) 18 MCG inhalation capsule Place 1 capsule (18 mcg total) into inhaler and inhale daily. 30 capsule 12   traMADol (ULTRAM) 50 MG tablet Take 1 tablet (50 mg total) by mouth every 6 (six) hours. (Patient taking differently: Take 50 mg by mouth every 6 (six) hours as needed for moderate pain. ) 20 tablet 0   calcium carbonate (OS-CAL - DOSED IN MG OF ELEMENTAL CALCIUM) 1250 (500 Ca) MG tablet Take 1 tablet (500 mg of elemental calcium total) by mouth daily with breakfast. (Patient not taking: Reported on 06/09/2020) 30 tablet 0   cephALEXin (KEFLEX) 500 MG  capsule Take 1 capsule (500 mg total) by mouth 2 (two) times daily. (Patient not taking: Reported on 07/31/2020) 14 capsule 0   HYDROcodone-acetaminophen (NORCO/VICODIN) 5-325 MG tablet Take 1 tablet by mouth every 6 (six) hours as needed for moderate pain. (Patient not taking: Reported on 06/09/2020) 30 tablet 0   melatonin 5 MG TABS Take 2 tablets (10 mg total) by mouth at bedtime as needed (sleep). (Patient not taking: Reported on 06/09/2020) 30 tablet 0   methocarbamol (ROBAXIN) 500 MG tablet Take 1 tablet (500 mg total) by mouth every 6 (six) hours as needed for muscle spasms. (Patient not taking: Reported on 06/09/2020) 30 tablet 0   umeclidinium bromide (INCRUSE ELLIPTA) 62.5 MCG/INH AEPB Inhale 1 puff into the lungs daily. (Patient not taking: Reported on 07/31/2020) 1 each 0   [DISCONTINUED] iron polysaccharides (NIFEREX) 150 MG capsule Take 1 capsule (150 mg total) by mouth daily after supper.  30 capsule 0    Past Medical History:  Diagnosis Date   (HFpEF) heart failure with preserved ejection fraction (HCC)    Arthritis    Back pain    s/p lumbar injection 2014   BENIGN PROSTATIC HYPERTROPHY 05/21/2007   Bladder cancer ([place]) 10/2011   CAD (coronary artery disease)    nonobstructive by cath 6/12:  mid LAD 30%, proximal obtuse marginal-2 30%, proximal RCA 20%, mid RCA 30-40%.  He had normal cardiac output and mildly elevated filling pressures but no significant pulmonary hypertension;   Echocardiogram in May 2012 demonstrated EF 50-55% and left atrial enlargement    Cellulitis of left foot    Hospitalized in 2006   Chronic kidney disease (CKD), stage III (moderate) 12/04/2007   FOLLOWED BY DR PATEL   Chronic pain of lower extremity    Complication of anesthesia    1 time bladder cancer surgery- 2012 , oxygen satruratioon dropped had to stay overnight, no problems since then.   COPD (chronic obstructive pulmonary disease) ([place])    DIABETES MELLITUS, TYPE II 05/21/2007   DIVERTICULOSIS, COLON 05/21/2007   pt denies   Dyspnea    with exertion, patient mointors saturation   Fatigue AGE-RELATED   Gross hematuria    pt denies   HYPERTENSION 05/21/2007   Impaired hearing BILATERAL HEARING AIDS   On home oxygen therapy    as needed   PAD (peripheral artery disease) ([place])    a. 02/2016 L foot nonhealing ulcer-->Periph angio: L pop 100 w/ reconstitution via extensive vollats to the prox-mid peroneal (only patent vessel BK)-->PTA of L Pop and peroneal; b. Ongoing LE ischemia 5 & 06/2016 req amputation of toes on L foot.   Pneumonia    Hx  of    Psoriasis ELBOWS   Psoriasis    PSORIASIS, SCALP 10/25/2008    Past Surgical History:  Procedure Laterality Date   AMPUTATION Left 03/27/2016   Procedure: partial first AMPUTATION RAY; LEFT;  Surgeon: Trula Slade, DPM;  Location: [place];  Service: Podiatry;  Laterality: Left;   AMPUTATION Left 06/12/2016   Procedure: TRANSMETATARSAL  AMPUTATION LEFT FOOT;  Surgeon: Newt Minion, MD;  Location: [place];  Service: Orthopedics;  Laterality: Left;   APPENDECTOMY  1941   BIOPSY  05/16/2020   Procedure: BIOPSY;  Surgeon: Lavena Bullion, DO;  Location: [facility];  Service: Gastroenterology;;   CARDIAC CATHETERIZATION  04-24-11/  DR Rogue Jury ARIDA   MILD NONOBSTRUCTIVE CAD, NORMA CARDIAC OUTPUT   CARDIOVASCULAR STRESS TEST  2007   CATARACT EXTRACTION W/ INTRAOCULAR  LENS  IMPLANT, BILATERAL Bilateral ~ 2010   CYSTOSCOPY  12/25/2011   Procedure: CYSTOSCOPY;  Surgeon: Molli Hazard, MD;  Location[facility];  Service: Urology;  Laterality: N/A;  needs intubation and to be paralyzed    CYSTOSCOPY W/ RETROGRADES  10/30/2011   Procedure: CYSTOSCOPY WITH RETROGRADE PYELOGRAM;  Surgeon: Molli Hazard, MD;  Location[facility];  Service: Urology;  Laterality: Bilateral;  CYSTOSCOPY POSS TURBT BILATERAL RETROGRADE PYLEOGRAM    CYSTOSCOPY W/ RETROGRADES  11/30/2012   Procedure: CYSTOSCOPY WITH RETROGRADE PYELOGRAM;  Surgeon: Molli Hazard, MD;  Location: [facility];  Service: Urology;  Laterality: Bilateral;  Flexible cystoscopy.   CYSTOSCOPY WITH BIOPSY  11/30/2012   Procedure: CYSTOSCOPY WITH BIOPSY;  Surgeon: Molli Hazard, MD;  Location: [facility];  Service: Urology;  Laterality: N/A;   ESOPHAGOGASTRODUODENOSCOPY (EGD) WITH PROPOFOL N/A 05/16/2020   Procedure: ESOPHAGOGASTRODUODENOSCOPY (EGD) WITH PROPOFOL;  Surgeon: Lavena Bullion, DO;  [facility];  Service: Gastroenterology;  Laterality: N/A;   INCISION AND DRAINAGE FOOT Left ~ 2005   LEFT FOOT DUE TO INFECTION FROM  NAIL PUNCTURE INJURY   LUMBAR LAMINECTOMY/DECOMPRESSION MICRODISCECTOMY N/A 10/18/2016   Procedure: LEFT AND CENTRAL L4-5 LUMBAR LAMINECTOMY WITH RESECTION OF SYNOVIAL CYST;  Surgeon: Jessy Oto, MD;  Location: [place];  Service: Orthopedics;  Laterality: N/A;    ORIF FEMUR FRACTURE Left 04/19/2020   Procedure: OPEN REDUCTION INTERNAL FIXATION FEMORAL SHAFT FRACTURE;  Surgeon: Shona Needles, MD;  Location: [place];  Service: Orthopedics;  Laterality: Left;   PENILE PROSTHESIS IMPLANT  1990s   PERIPHERAL VASCULAR CATHETERIZATION N/A 02/14/2016   Procedure: Abdominal Aortogram w/Lower Extremity;  Surgeon: Wellington Hampshire, MD;  Location: [place] [facility];  Service: Cardiovascular;  Laterality: N/A;   PERIPHERAL VASCULAR CATHETERIZATION Left 02/28/2016   Procedure: Peripheral Vascular Balloon Angioplasty;  Surgeon: Wellington Hampshire, MD;  Location: [place] [facility];  Service: Cardiovascular;  Laterality: Left;  left peroneal and popliteal artery   TONSILLECTOMY AND ADENOIDECTOMY  1962   TOTAL HIP ARTHROPLASTY Left 12/27/2016   Procedure: LEFT TOTAL HIP ARTHROPLASTY ANTERIOR APPROACH;  Surgeon: Mcarthur Rossetti, MD;  Location: [facility];  Service: Orthopedics;  Laterality: Left;   TRANSURETHRAL RESECTION OF BLADDER TUMOR  10/30/2011   Procedure: TRANSURETHRAL RESECTION OF BLADDER TUMOR (TURBT);  Surgeon: Molli Hazard, MD;  [facility];  Service: Urology;  Laterality: N/A;   TRANSURETHRAL RESECTION OF BLADDER TUMOR  12/25/2011   Procedure: TRANSURETHRAL RESECTION OF BLADDER TUMOR (TURBT);  Surgeon: Molli Hazard, MD;  Location[facility];  Service: Urology;  Laterality: N/A;  need long gyrus instruments    VASECTOMY  1990s    Social History   Tobacco Use   Smoking status: Former Smoker    Packs/day: 2.00    Years: 22.00    Pack years: 44.00    Types: Cigarettes    Quit date: 11/11/1968    Years since quitting: 51.7   Smokeless tobacco: Never Used  Vaping Use   Vaping Use: Never used  Substance Use Topics   Alcohol use: Yes    Comment: occassional- 10/17/16- none  in 4 months- "too sick"   Drug use: No    Family History  Problem Relation Age of Onset   Diabetes Mother    Heart disease  Brother    Heart disease Brother    Heart disease Brother    Heart disease  Brother    Heart disease Brother    Heart disease Brother    Lung cancer Brother     PE: Vitals:   08/01/20 1330 08/01/20 1802 08/01/20 1817 08/01/20 1834  BP:  (!) 108/45 (!) 110/50 (!) 123/55  Pulse:  92 90 91  Resp:  20 19 17   Temp:  97.6 F (36.4 C) 97.6 F (36.4 C) 98.2 F (36.8 C)  TempSrc:    Oral  SpO2:  98% 93% 97%  Weight: 95.3 kg     Height: 6\' 2"  (1.88 m)      Patient appears to be in no acute distress  patient is alert and oriented x3 Atraumatic normocephalic head No cervical or supraclavicular lymphadenopathy appreciated No increased work of breathing, no audible wheezes/rhonchi Regular sinus rhythm/rate Abdomen is soft, nontender, nondistended, no CVA or suprapubic tenderness Lower extremities are symmetric without appreciable edema Grossly neurologically intact No identifiable skin lesions  Recent Labs    07/31/20 1420 07/31/20 1659  WBC 12.1* 12.6*  HGB 12.7* 12.3*  HCT 40.1 39.1   Recent Labs    07/31/20 1420 07/31/20 1659  NA 137  --   K 4.7  --   CL 99  --   CO2 30  --   GLUCOSE 150*  --   BUN 46*  --   CREATININE 1.94* 1.99*  CALCIUM 8.4*  --    No results for input(s): LABPT, INR in the last 72 hours. No results for input(s): LABURIN in the last 72 hours. Results for orders placed or performed during the hospital encounter of 07/31/20  SARS Coronavirus 2 by RT PCR (hospital order, performed in [facility] hospital lab) Nasopharyngeal Nasopharyngeal Swab     Status: None   Collection Time: 07/31/20  2:20 PM   Specimen: Nasopharyngeal Swab  Result Value Ref Range Status   SARS Coronavirus 2 NEGATIVE NEGATIVE Final    Comment: (NOTE) SARS-CoV-2 target nucleic acids are NOT DETECTED.  The SARS-CoV-2 RNA is generally detectable in upper and lower respiratory specimens during the acute phase of infection. The lowest concentration of SARS-CoV-2 viral copies  this assay can detect is 250 copies / mL. A negative result does not preclude SARS-CoV-2 infection and should not be used as the sole basis for treatment or other patient management decisions.  A negative result may occur with improper specimen collection / handling, submission of specimen other than nasopharyngeal swab, presence of viral mutation(s) within the areas targeted by this assay, and inadequate number of viral copies (<250 copies / mL). A negative result must be combined with clinical observations, patient history, and epidemiological information.  Fact Sheet for Patients:   StrictlyIdeas.no  Fact Sheet for Healthcare Providers: BankingDealers.co.za  This test is not yet approved or  cleared by the [place] FDA and has been authorized for detection and/or diagnosis of SARS-CoV-2 by FDA under an Emergency Use Authorization (EUA).  This EUA will remain in effect (meaning this test can be used) for the duration of the COVID-19 declaration under Section 564(b)(1) of the Act, 21 U.S.C. section 360bbb-3(b)(1), unless the authorization is terminated or revoked sooner.  Performed at [facility], [place] [address], [place], [place] [address]   Surgical PCR screen     Status: None   Collection Time: 07/31/20  4:05 PM   Specimen: Nasal Mucosa; Nasal Swab  Result Value Ref Range Status   MRSA, PCR NEGATIVE NEGATIVE Final   Staphylococcus aureus NEGATIVE NEGATIVE Final  Comment: (NOTE) The Xpert SA Assay (FDA approved for NASAL specimens in patients 18 years of age and older), is one component of a comprehensive surveillance program. It is not intended to diagnose infection nor to guide or monitor treatment. Performed at [facility], [place] [address], [place], [place] [address]   Surgical pcr screen     Status: None   Collection Time: 07/31/20 10:18 PM   Specimen: Nasal Mucosa; Nasal Swab   Result Value Ref Range Status   MRSA, PCR NEGATIVE NEGATIVE Final   Staphylococcus aureus NEGATIVE NEGATIVE Final    Comment: (NOTE) The Xpert SA Assay (FDA approved for NASAL specimens in patients 56 years of age and older), is one component of a comprehensive surveillance program. It is not intended to diagnose infection nor to guide or monitor treatment. Performed at [facility], [place] [address], [place], [place] [address]     Imaging: none  Imp: The patient was started on doxycycline just prior to his fall on Monday morning.  This is for a Citrobacter UTI.  I suspect that his recurrent urinary tract infections are a result of his incomplete bladder emptying.  Recommendations: I would recommend that the patient be bladder scanned every 4 hours and in and out cath for volumes greater than 400.  The patient should be started on doxycycline for his UTI.  This should be treated for 10 days.  When the patient is discharged home his wife will need to resume cathing  twice daily as we have discussed.  Thank you for involving me in this patient's care, Please page with any further questions or concerns. Ardis Hughs

## 2020-08-01 NOTE — Progress Notes (Signed)
Initial Nutrition Assessment  RD working remotely.  DOCUMENTATION CODES:   Not applicable  INTERVENTION:   -Once diet is advanced, add:  -Glucerna Shake po BID, each supplement provides 220 kcal and 10 grams of protein -Ensure Max po daily, each supplement provides 150 kcal and 30 grams of protein -MVI with minerals daily  NUTRITION DIAGNOSIS:   Increased nutrient needs related to post-op healing as evidenced by estimated needs.  GOAL:   Patient will meet greater than or equal to 90% of their needs  MONITOR:   Supplement acceptance, PO intake, Diet advancement, Labs, Weight trends, Skin, I & O's  REASON FOR ASSESSMENT:   Consult Assessment of nutrition requirement/status, Hip fracture protocol  ASSESSMENT:   This is an 86 year old male with past medical history of HFpEF, CAD, CKD 3, COPD, recurrent UTIs, left femur fracture repair in June, diabetes who presented to the ED with a chief complaint of a fall and left leg pain.  Pt admitted with lt femur fx s/p mechanical fall.   Reviewed I/O's: -650 ml x 24 hours  UOP: 650 ml x 24 hours  Attempted to speak with pt via call to hospital room phone, however, unable to reach.   Pt with recent lt femur fx s/p ORIF on 04/19/20. Per orthopedics notes, plan for surgery today.   Reviewed wt hx; wt has been stable over the past 3 months.   Pt with increased nutritional needs due to wound healing and post-operative healing; would greatly benefit from addition of oral nutrition supplements.   Medications reviewed and include calcium carbonate.  Lab Results  Component Value Date   HGBA1C 8.6 (H) 07/20/2020   PTA DM medications are 10 units insulin glargine BID. Per ADA's Standards of Medical Care of Diabetes, glycemic targets for older adults who have multiple co-morbidities, cognitive impairments, and functional dependence should be less stringent (Hgb A1c <8.0-8.5).   Labs reviewed: CBGS: 97-124 (inpatient orders for glycemic  control are 5 units insulin glargine BID).   Diet Order:   Diet Order            Diet NPO time specified  Diet effective midnight                 EDUCATION NEEDS:   No education needs have been identified at this time  Skin:  Skin Assessment: Skin Integrity Issues: Skin Integrity Issues:: Stage II Stage II: rt and lt buttocks  Last BM:  07/30/20  Height:   Ht Readings from Last 1 Encounters:  07/31/20 6\' 2"  (1.88 m)    Weight:   Wt Readings from Last 1 Encounters:  07/31/20 95.3 kg    Ideal Body Weight:  86.4 kg  BMI:  Body mass index is 26.96 kg/m.  Estimated Nutritional Needs:   Kcal:  2200-2400  Protein:  110-125 grams  Fluid:  > 2 L    Loistine Chance, RD, LDN, [place] Registered Dietitian II Certified Diabetes Care and Education Specialist Please refer to [facility] for RD and/or RD on-call/weekend/after hours pager

## 2020-08-01 NOTE — Transfer of Care (Signed)
Immediate Anesthesia Transfer of Care Note  Patient: [name] Small  Procedure(s) Performed: INTRAMEDULLARY (IM) RETROGRADE FEMORAL NAILING (Left Leg Upper)  Patient Location: PACU  Anesthesia Type:General  Level of Consciousness: drowsy and patient cooperative  Airway & Oxygen Therapy: Patient Spontanous Breathing and Patient connected to face mask oxygen  Post-op Assessment: Report given to RN and Post -op Vital signs reviewed and stable  Post vital signs: Reviewed and stable  Last Vitals:  Vitals Value Taken Time  BP 108/45 08/01/20 1802  Temp 36.4 C 08/01/20 1802  Pulse 90 08/01/20 1808  Resp 18 08/01/20 1808  SpO2 97 % 08/01/20 1808  Vitals shown include unvalidated device data.  Last Pain:  Vitals:   08/01/20 1802  TempSrc:   PainSc: 0-No pain      Patients Stated Pain Goal: 3 (33/74/45 1460)  Complications: No complications documented.

## 2020-08-01 NOTE — Progress Notes (Signed)
PROGRESS NOTE    [name] Small  YQM:[id number] DOB: 08-25-1928 DOA: 07/31/2020 PCP: Tonia Ghent, MD   Brief Narrative:  This is an 86 year old male with past medical history of HFpEF, CAD, CKD 3, COPD, recurrent UTIs, left femur fracture repair in June, diabetes who presented to the ED with a chief complaint of a fall and left leg pain.  He had been feeling unwell lately with increased urinary urgency over the weekend.  He had a fall on Saturday with subsequent left hip pain but did not come straight to the ED.  Today his occupational therapist was evaluating him and recommended that he come to the ED.  He has had significant pain in his left leg since.  No loss of consciousness, no head trauma.  He is not on any blood thinners.  No other pain. Of note he was found to have a multidrug-resistant E. coli UTI on 9/1 and was prescribed Keflex and found to have recurrent UTI recently at the urology office and was supposed to start Keflex today.  He does admit to urinary urgency and occasionally urinary retention. In ED: afebrile hemodynamically stable on room air.  Notable labs: Glucose 150, creatinine 1.94 (baseline 1.25 in July), WBC 12.1.  Left femur x-ray: New mildly angulated oblique fracture involving the distal left femoral shaft around the 2 most distal screws of the previously placed internal fixation plate.  ED physician discussed with Dr. [name], orthopedics, who recommended admission/transfer to [facility]:   Principal Problem:   Femur fracture, left ([place]) Active Problems:   Type 2 diabetes mellitus with diabetic polyneuropathy (HCC)   BPH (benign prostatic hyperplasia)   Acute kidney injury ([place])   Periprosthetic hip fracture   Recurrent UTI   Left femur fracture s/p mechanical fall with history of recent left femur fracture s/p ORIF by Dr. [name] previously on 04/19/2020 Orthopedic surgery consulted Tentative ORIF 9/21 per ortho schedule Pain  management/DVT ppx per ortho PT/OT to follow  Recurrent UTI with history of multidrug-resistant E. coli UTI Unable to see records from his recent urology visit but was to start on Keflex today Continue Keflex  Suspected AKI vs worsening CKD 3b Creatinine 1.9 and was 1.25 in July has been steadily increasing Unclear if worsening baseline function vs more acute process Lab Results  Component Value Date   CREATININE 1.99 (H) 07/31/2020   CREATININE 1.94 (H) 07/31/2020   CREATININE 1.81 (H) 07/20/2020    Intermittent urinary retention with history of enlarged prostate  Follows with urology Straight caths at home Continue tamsulosin Bladder scan and straight cath as needed  Generalized weakness, likely multifactorial: UTI, debility PT eval after his surgery  COPD, not in exacerbation Continue home Spiriva with Breo  HFpEF, not in exacerbation Daily weights and intake/output  Uncontrolled insulin-dependent diabetes mellitus type 2  Increase Lantus to 5 units twice daily, monitor for hypoglycemia Lab Results  Component Value Date   HGBA1C 8.6 (H) 07/20/2020    DVT prophylaxis: Heparin Code Status: DNR Family Communication: Wife at bedside  Status is: Inpatient  Dispo: The patient is from: Home              Anticipated d/c is to: SNF              Anticipated d/c date is: 48 to 72 hours pending clinical course              Patient currently not medically stable for discharge  due to ongoing need for further surgical evaluation in the setting of fracture and mechanical fall.  Consultants:   Ortho  Procedures:   ORIF 08/01/2020 per orthopedics  Antimicrobials:  Perioperatively per orthopedics  Subjective: No acute issues or events overnight, patient's only complaint is being hungry and thirsty while n.p.o. for procedure.  Otherwise denies nausea, vomiting, diarrhea, constipation, headache, fevers, chills.  Objective: Vitals:   07/31/20 2015 07/31/20 2045 07/31/20  2100 08/01/20 0355  BP: (!) 129/57 132/64 118/61 126/60  Pulse: 77 77 77 76  Resp:   17 16  Temp:   98 F (36.7 C) 97.7 F (36.5 C)  TempSrc:   Oral Oral  SpO2: 92% 93% 92% 92%  Weight:      Height:        Intake/Output Summary (Last 24 hours) at 08/01/2020 0757 Last data filed at 08/01/2020 0500 Gross per 24 hour  Intake --  Output 650 ml  Net -650 ml   Filed Weights   07/31/20 1030  Weight: 95.3 kg    Examination:  General exam: Appears calm and comfortable  Respiratory system: Clear to auscultation. Respiratory effort normal. Cardiovascular system: S1 & S2 heard, RRR. No JVD, murmurs, rubs, gallops or clicks. No pedal edema. Gastrointestinal system: Abdomen is nondistended, soft and nontender. No organomegaly or masses felt. Normal bowel sounds heard. Central nervous system: Alert and oriented. No focal neurological deficits. Extremities: Symmetric 5 x 5 power. Skin: No rashes, lesions or ulcers Psychiatry: Judgement and insight appear normal. Mood & affect appropriate.     Data Reviewed: I have personally reviewed following labs and imaging studies  CBC: Recent Labs  Lab 07/31/20 1420 07/31/20 1659  WBC 12.1* 12.6*  HGB 12.7* 12.3*  HCT 40.1 39.1  MCV 98.8 99.0  PLT 305 272   Basic Metabolic Panel: Recent Labs  Lab 07/31/20 1420 07/31/20 1659  NA 137  --   K 4.7  --   CL 99  --   CO2 30  --   GLUCOSE 150*  --   BUN 46*  --   CREATININE 1.94* 1.99*  CALCIUM 8.4*  --    GFR: Estimated Creatinine Clearance: 29.3 mL/min (A) (by C-G formula based on SCr of 1.99 mg/dL (H)). Liver Function Tests: Recent Labs  Lab 07/31/20 1420  AST 14*  ALT 11  ALKPHOS 80  BILITOT 0.5  PROT 7.4  ALBUMIN 3.3*   No results for input(s): LIPASE, AMYLASE in the last 168 hours. No results for input(s): AMMONIA in the last 168 hours. Coagulation Profile: No results for input(s): INR, PROTIME in the last 168 hours. Cardiac Enzymes: No results for input(s):  CKTOTAL, CKMB, CKMBINDEX, TROPONINI in the last 168 hours. BNP (last 3 results) No results for input(s): PROBNP in the last 8760 hours. HbA1C: No results for input(s): HGBA1C in the last 72 hours. CBG: Recent Labs  Lab 08/01/20 0024 08/01/20 0628  GLUCAP 124* 97   Lipid Profile: No results for input(s): CHOL, HDL, LDLCALC, TRIG, CHOLHDL, LDLDIRECT in the last 72 hours. Thyroid Function Tests: No results for input(s): TSH, T4TOTAL, FREET4, T3FREE, THYROIDAB in the last 72 hours. Anemia Panel: No results for input(s): VITAMINB12, FOLATE, FERRITIN, TIBC, IRON, RETICCTPCT in the last 72 hours. Sepsis Labs: No results for input(s): PROCALCITON, LATICACIDVEN in the last 168 hours.  Recent Results (from the past 240 hour(s))  SARS Coronavirus 2 by RT PCR (hospital order, performed in [facility] hospital lab) Nasopharyngeal Nasopharyngeal Swab  Status: None   Collection Time: 07/31/20  2:20 PM   Specimen: Nasopharyngeal Swab  Result Value Ref Range Status   SARS Coronavirus 2 NEGATIVE NEGATIVE Final    Comment: (NOTE) SARS-CoV-2 target nucleic acids are NOT DETECTED.  The SARS-CoV-2 RNA is generally detectable in upper and lower respiratory specimens during the acute phase of infection. The lowest concentration of SARS-CoV-2 viral copies this assay can detect is 250 copies / mL. A negative result does not preclude SARS-CoV-2 infection and should not be used as the sole basis for treatment or other patient management decisions.  A negative result may occur with improper specimen collection / handling, submission of specimen other than nasopharyngeal swab, presence of viral mutation(s) within the areas targeted by this assay, and inadequate number of viral copies (<250 copies / mL). A negative result must be combined with clinical observations, patient history, and epidemiological information.  Fact Sheet for Patients:   StrictlyIdeas.no  Fact Sheet for  Healthcare Providers: BankingDealers.co.za  This test is not yet approved or  cleared by the [place] FDA and has been authorized for detection and/or diagnosis of SARS-CoV-2 by FDA under an Emergency Use Authorization (EUA).  This EUA will remain in effect (meaning this test can be used) for the duration of the COVID-19 declaration under Section 564(b)(1) of the Act, 21 U.S.C. section 360bbb-3(b)(1), unless the authorization is terminated or revoked sooner.  Performed at [facility], [place] [address] [place], [place] [address]   Surgical PCR screen     Status: None   Collection Time: 07/31/20  4:05 PM   Specimen: Nasal Mucosa; Nasal Swab  Result Value Ref Range Status   MRSA, PCR NEGATIVE NEGATIVE Final   Staphylococcus aureus NEGATIVE NEGATIVE Final    Comment: (NOTE) The Xpert SA Assay (FDA approved for NASAL specimens in patients 60 years of age and older), is one component of a comprehensive surveillance program. It is not intended to diagnose infection nor to guide or monitor treatment. Performed at [facility], [place] [address], [place], [place] [address]   Surgical pcr screen     Status: None   Collection Time: 07/31/20 10:18 PM   Specimen: Nasal Mucosa; Nasal Swab  Result Value Ref Range Status   MRSA, PCR NEGATIVE NEGATIVE Final   Staphylococcus aureus NEGATIVE NEGATIVE Final    Comment: (NOTE) The Xpert SA Assay (FDA approved for NASAL specimens in patients 14 years of age and older), is one component of a comprehensive surveillance program. It is not intended to diagnose infection nor to guide or monitor treatment. Performed at [facility], [place] [address] [place], [place] [address]          Radiology Studies: DG Pelvis 1-2 Views  Result Date: 07/31/2020 CLINICAL DATA:  Pelvic pain after fall. EXAM: PELVIS - 1-2 VIEW COMPARISON:  April 18, 2020. FINDINGS: Status post left total hip  arthroplasty and surgical internal fixation of old proximal left femoral shaft fracture. No acute abnormality seen involving the right hip or pelvis. IMPRESSION: No acute abnormality seen in the pelvis. Electronically Signed   By: Marijo Conception M.D.   On: 07/31/2020 12:56   DG Tibia/Fibula Left  Result Date: 07/31/2020 CLINICAL DATA:  Left leg pain after fall. EXAM: LEFT TIBIA AND FIBULA - 2 VIEW COMPARISON:  None. FINDINGS: There is no evidence of fracture or other focal bone lesions. Soft tissues are unremarkable. IMPRESSION: Negative. Electronically Signed   By: Sabino Dick  Jr M.D.   On: 07/31/2020 12:51   DG Knee Complete 4 Views Left  Result Date: 07/31/2020 CLINICAL DATA:  Left knee pain after fall. EXAM: LEFT KNEE - COMPLETE 4+ VIEW COMPARISON:  None. FINDINGS: No evidence of fracture, dislocation, or joint effusion. No evidence of arthropathy or other focal bone abnormality. Soft tissues are unremarkable. IMPRESSION: Negative. Electronically Signed   By: Marijo Conception M.D.   On: 07/31/2020 12:52   DG Femur Portable Min 2 Views Left  Result Date: 07/31/2020 CLINICAL DATA:  Left leg pain after fall EXAM: LEFT FEMUR PORTABLE 2 VIEWS COMPARISON:  May 11, 2020. FINDINGS: Status post left total hip arthroplasty and surgical internal fixation of old proximal left femoral shaft fracture. There is now noted new mildly angulated oblique fracture involving the distal femoral shaft around the 2 most distal screws of the fixation plate. IMPRESSION: New mildly angulated oblique fracture involving the distal left femoral shaft around the 2 most distal screws of the previously placed internal fixation plate. Electronically Signed   By: Marijo Conception M.D.   On: 07/31/2020 12:54        Scheduled Meds: . Apremilast  30 mg Oral Daily  . atorvastatin  10 mg Oral Daily  . calcium carbonate  1 tablet Oral Q breakfast  . escitalopram  5 mg Oral Daily  . fluticasone furoate-vilanterol  1 puff  Inhalation Daily  . heparin  5,000 Units Subcutaneous Q8H  . insulin glargine  5 Units Subcutaneous BID  . mupirocin ointment  1 application Nasal BID  . tamsulosin  0.8 mg Oral QPC supper  . umeclidinium bromide  1 puff Inhalation Daily   Continuous Infusions: .  ceFAZolin (ANCEF) IV       LOS: 1 day   Time spent: 7min  Makennah Omura C August Longest, DO [facility]  If 7PM-7AM, please contact night-coverage [email or web address]  08/01/2020, 7:57 AM

## 2020-08-01 NOTE — Progress Notes (Signed)
Urine culture results from [facility] on September 16th.  It resulted on a Sunday.  We called and started him on Doxycycline on Monday morning, just before he fell.   Culture, Urine - N  Notes: Performed By: [facility], [place], [place], [address]; Lab Dir: Vedia Pereyra, [phone] >100,000 C/ML Lonell Grandchild NEGATIVE RODS; ID AND SENSITIVITY TO FOLLOW ROrganism 1 - N  Notes: Performed By: [facility]	[place], [facility], [address] [place], [place], [place], [address]; Lab Dir: Vedia Pereyra, [phone] . Antimicrobial Susceptibility and Organism Identification Report  FINAL  Source : URINE CULTURE Iso/Result : 01 >100,000 C/ML Citrobacter farmeri Antimicrobic/Dose MIC SYSTEMIC URINE --------------------------------------------------------------- Amp/Sulbactam 16/8 I I Amikacin <16 S S Ampicillin >16 R R Amox/K Clav <8/4 S S Aztreonam <4 S S Ceftriaxone <1 S S Ceftazidime <1 S S Cefotaxime <2 S S Cefazolin 4 S S Ciprofloxacin >2 R R Cefepime <2 S S Cefotetan <16 S S Ertapenem <0.5 S S Nitrofurantoin <32 S Gentamicin <4 S S Levofloxacin >4 R R Meropenem <1 S S Pip/Tazo <16 S S Trimeth/Sulfa <2/38 S S Tetracycline <4 S S Tobramycin <4 S S

## 2020-08-01 NOTE — NC FL2 (Signed)
[place] MEDICAID FL2 LEVEL OF CARE SCREENING TOOL     IDENTIFICATION  Patient Name: [name] Small Birthdate: 1928/09/30 Sex: male Admission Date (Current Location): 07/31/2020  [facility] and [place] Number:  Herbalist and Address:  [facility],  [place], [place]      Provider Number: [id number]  Attending Physician Name and Address:  Little Ishikawa, MD  Relative Name and Phone Number:  Edrees Valent #[phone]    Current Level of Care: Hospital Recommended Level of Care: [place] Prior Approval Number:    Date Approved/Denied:   PASRR Number: [id number]  Discharge Plan: SNF    Current Diagnoses: Patient Active Problem List   Diagnosis Date Noted  . Recurrent UTI 07/31/2020  . Insomnia 05/25/2020  . Advance care planning   . Palliative care by specialist   . Gastritis and gastroduodenitis   . Gastroesophageal reflux disease with esophagitis without hemorrhage   . Ulcer of esophagus without bleeding   . GI bleed 05/14/2020  . Urinary retention with incomplete bladder emptying 05/10/2020  . Adjustment reaction of late life   . Femur fracture, left ([place]) 04/26/2020  . Periprosthetic hip fracture 04/26/2020  . Acute kidney injury ([place])   . Closed left subtrochanteric femur fracture, initial encounter ([place])   . Acute blood loss anemia   . Gross hematuria   . Controlled type 2 diabetes mellitus with hyperglycemia, with long-term current use of insulin ([place])   . Benign essential HTN   . Post-operative pain   . Closed left hip fracture, initial encounter ([place]) 04/18/2020  . Anxiety 12/15/2019  . Laceration of right thumb without foreign body with damage to nail 12/15/2019  . Anemia 01/31/2017  . Hypoxia   . History of hip replacement 12/26/2016  . Status post lumbar laminectomy 12/26/2016  . S/p left hip fracture 12/26/2016  . Idiopathic chronic venous hypertension of left lower extremity with  inflammation 11/07/2016  . Spinal stenosis of lumbar region with neurogenic claudication 10/18/2016  . Spinal stenosis of lumbar region 09/25/2016  . Fall 07/04/2016  . COPD (chronic obstructive pulmonary disease) ([place]) 06/10/2016  . Weakness generalized 03/18/2016  . Critical lower limb ischemia 02/28/2016  . Peripheral arterial disease ([place]) 10/17/2015  . Edema 07/19/2015  . Claudication ([place]) 01/31/2015  . DOE (dyspnea on exertion) 01/31/2015  . Hyperlipidemia 01/31/2015  . HTN (hypertension) 01/31/2015  . Chronic diastolic CHF (congestive heart failure) ([place]) 01/21/2014  . Atrial fibrillation ([place]) 12/16/2012  . Hyperkalemia 11/27/2011  . Bladder cancer ([place]) 09/22/2011  . Leg cramps 08/28/2011  . Cough 05/26/2011  . CAD (coronary artery disease) of artery bypass graft 05/09/2011  . Dyspnea on exertion 04/19/2011  . PSORIASIS, SCALP 10/25/2008  . Chronic kidney disease, stage III (moderate) 12/04/2007  . Type 2 diabetes mellitus with diabetic polyneuropathy ([place]) 05/21/2007  . DIVERTICULOSIS, COLON 05/21/2007  . BPH (benign prostatic hyperplasia) 05/21/2007    Orientation RESPIRATION BLADDER Height & Weight     Self, Time, Situation, Place  Normal Continent Weight: 95.3 kg Height:  6\' 2"  (188 cm)  BEHAVIORAL SYMPTOMS/MOOD NEUROLOGICAL BOWEL NUTRITION STATUS      Continent Diet (Heart Healthy)  AMBULATORY STATUS COMMUNICATION OF NEEDS Skin   Extensive Assist Verbally Normal                       Personal Care Assistance Level of Assistance  Bathing, Feeding, Dressing Bathing Assistance: Limited assistance Feeding assistance: Independent Dressing  Assistance: Limited assistance     Functional Limitations Info  Sight, Hearing, Speech Sight Info: Adequate Hearing Info: Adequate Speech Info: Adequate    SPECIAL CARE FACTORS FREQUENCY  PT (By licensed PT), OT (By licensed OT)     PT Frequency: 5x per week OT Frequency: 5x per week             Contractures Contractures Info: Not present    Additional Factors Info  Code Status Code Status Info: DNR Allergies Info: Actos (Pioglitazone Hydrochloride), Aspirin, Ace Inhibitors, Bactrim, Gabapentin, Lyrica (Pregabalin), Pravastatin, Sulfa Drugs Cross Reactors           Current Medications (08/01/2020):  This is the current hospital active medication list Current Facility-Administered Medications  Medication Dose Route Frequency Provider Last Rate Last Admin  . acetaminophen (TYLENOL) tablet 325-650 mg  325-650 mg Oral Q4H PRN Harold Hedge, MD   650 mg at 08/01/20 0818  . Apremilast TABS 30 mg  30 mg Oral Daily Harold Hedge, MD      . atorvastatin (LIPITOR) tablet 10 mg  10 mg Oral Daily Harold Hedge, MD   10 mg at 07/31/20 2101  . calcium carbonate (OS-CAL - dosed in mg of elemental calcium) tablet 500 mg of elemental calcium  1 tablet Oral Q breakfast Harold Hedge, MD      . ceFAZolin (ANCEF) IVPB 2g/100 mL premix  2 g Intravenous On Call to OR Ainsley Spinner, PA-C      . cephALEXin [facility]) capsule 500 mg  500 mg Oral Q12H Little Ishikawa, MD      . docusate sodium (COLACE) capsule 100 mg  100 mg Oral Daily PRN Harold Hedge, MD      . escitalopram (LEXAPRO) tablet 5 mg  5 mg Oral Daily Harold Hedge, MD      . Derrill Memo ON 08/02/2020] feeding supplement (GLUCERNA SHAKE) (GLUCERNA SHAKE) liquid 237 mL  237 mL Oral BID BM Little Ishikawa, MD      . fluticasone furoate-vilanterol (BREO ELLIPTA) 200-25 MCG/INH 1 puff  1 puff Inhalation Daily Harold Hedge, MD   1 puff at 08/01/20 1144  . heparin injection 5,000 Units  5,000 Units Subcutaneous Q8H Harold Hedge, MD   5,000 Units at 07/31/20 2316  . HYDROcodone-acetaminophen (NORCO/VICODIN) 5-325 MG per tablet 1 tablet  1 tablet Oral Q6H PRN Harold Hedge, MD      . insulin glargine (LANTUS) injection 5 Units  5 Units Subcutaneous BID Harold Hedge, MD   5 Units at 08/01/20 0818  . methocarbamol (ROBAXIN) tablet 500  mg  500 mg Oral Q6H PRN Harold Hedge, MD      . morphine 2 MG/ML injection 0.5 mg  0.5 mg Intravenous Q2H PRN Harold Hedge, MD   0.5 mg at 07/31/20 2106  . [START ON 08/02/2020] multivitamin with minerals tablet 1 tablet  1 tablet Oral Daily Little Ishikawa, MD      . mupirocin ointment (BACTROBAN) 2 % 1 application  1 application Nasal BID Harold Hedge, MD   1 application at 78/24/23 2317  . polyethylene glycol (MIRALAX / GLYCOLAX) packet 17 g  17 g Oral Daily PRN Harold Hedge, MD      . Derrill Memo ON 08/02/2020] protein supplement (ENSURE MAX) liquid  11 oz Oral QHS Little Ishikawa, MD      . senna [facility]) tablet 17.2 mg  2 tablet Oral QHS PRN Marva Panda  E, MD      . tamsulosin (FLOMAX) capsule 0.8 mg  0.8 mg Oral QPC supper Harold Hedge, MD   0.8 mg at 07/31/20 2102  . umeclidinium bromide (INCRUSE ELLIPTA) 62.5 MCG/INH 1 puff  1 puff Inhalation Daily Harold Hedge, MD   1 puff at 08/01/20 1144     Discharge Medications: Please see discharge summary for a list of discharge medications.  Relevant Imaging Results:  Relevant Lab Results:   Additional Information SS#: [id number]  Erenest Rasher, RN

## 2020-08-01 NOTE — Anesthesia Preprocedure Evaluation (Addendum)
Anesthesia Evaluation  Patient identified by MRN, date of birth, ID band Patient awake    Airway Mallampati: II  TM Distance: >3 FB     Dental   Pulmonary shortness of breath, pneumonia, former smoker,    breath sounds clear to auscultation       Cardiovascular hypertension, + CAD, + Peripheral Vascular Disease, +CHF and + DOE   Rhythm:Regular Rate:Normal     Neuro/Psych Anxiety  Neuromuscular disease    GI/Hepatic Neg liver ROS, PUD,   Endo/Other  diabetes  Renal/GU Renal InsufficiencyRenal disease     Musculoskeletal  (+) Arthritis ,   Abdominal   Peds  Hematology  (+) anemia ,   Anesthesia Other Findings   Reproductive/Obstetrics                             Anesthesia Physical Anesthesia Plan  ASA: III  Anesthesia Plan: General   Post-op Pain Management:    Induction: Intravenous  PONV Risk Score and Plan: 2 and Ondansetron and Dexamethasone  Airway Management Planned: Oral ETT  Additional Equipment:   Intra-op Plan:   Post-operative Plan: Possible Post-op intubation/ventilation  Informed Consent: I have reviewed the patients History and Physical, chart, labs and discussed the procedure including the risks, benefits and alternatives for the proposed anesthesia with the patient or authorized representative who has indicated his/her understanding and acceptance.     Dental advisory given  Plan Discussed with: CRNA, Anesthesiologist and Surgeon  Anesthesia Plan Comments:        Anesthesia Quick Evaluation

## 2020-08-01 NOTE — Progress Notes (Signed)
Pt to surgery. Pt's wife took wedding ring.

## 2020-08-01 NOTE — Consult Note (Signed)
Orthopaedic Trauma Service Consultation  Reason for Consult: Left peri-implant left fracture Referring Physician: Marva Panda, MD  [name] Small is an 86 y.o. male.  HPI: Patient sustained twisting injury with fall resulting in left leg pain and inability to bear weight. Urinary tract infection with multidrug resistant E Coli. X-rays demonstrate fracture below the plate which was used to fix his previous fracture below a hemiarthroplasty. Denies numbness or tingling in the leg.  Past Medical History:  Diagnosis Date  . (HFpEF) heart failure with preserved ejection fraction ([place])   . Arthritis   . Back pain    s/p lumbar injection 2014  . BENIGN PROSTATIC HYPERTROPHY 05/21/2007  . Bladder cancer ([place]) 10/2011  . CAD (coronary artery disease)    nonobstructive by cath 6/12:  mid LAD 30%, proximal obtuse marginal-2 30%, proximal RCA 20%, mid RCA 30-40%.  He had normal cardiac output and mildly elevated filling pressures but no significant pulmonary hypertension;   Echocardiogram in May 2012 demonstrated EF 50-55% and left atrial enlargement   . Cellulitis of left foot    Hospitalized in 2006  . Chronic kidney disease (CKD), stage III (moderate) 12/04/2007   FOLLOWED BY DR PATEL  . Chronic pain of lower extremity   . Complication of anesthesia    1 time bladder cancer surgery- 2012 , oxygen satruratioon dropped had to stay overnight, no problems since then.  Marland Kitchen COPD (chronic obstructive pulmonary disease) ([place])   . DIABETES MELLITUS, TYPE II 05/21/2007  . DIVERTICULOSIS, COLON 05/21/2007   pt denies  . Dyspnea    with exertion, patient mointors saturation  . Fatigue AGE-RELATED  . Gross hematuria    pt denies  . HYPERTENSION 05/21/2007  . Impaired hearing BILATERAL HEARING AIDS  . On home oxygen therapy    as needed  . PAD (peripheral artery disease) ([place])    a. 02/2016 L foot nonhealing ulcer-->Periph angio: L pop 100 w/ reconstitution via extensive vollats to the prox-mid peroneal  (only patent vessel BK)-->PTA of L Pop and peroneal; b. Ongoing LE ischemia 5 & 06/2016 req amputation of toes on L foot.  . Pneumonia    Hx  of   . Psoriasis ELBOWS  . Psoriasis   . PSORIASIS, SCALP 10/25/2008    Past Surgical History:  Procedure Laterality Date  . AMPUTATION Left 03/27/2016   Procedure: partial first AMPUTATION RAY; LEFT;  Surgeon: Trula Slade, DPM;  Location: [place] [facility];  Service: Podiatry;  Laterality: Left;  . AMPUTATION Left 06/12/2016   Procedure: TRANSMETATARSAL AMPUTATION LEFT FOOT;  Surgeon: Newt Minion, MD;  Location: [place];  Service: Orthopedics;  Laterality: Left;  . APPENDECTOMY  1941  . BIOPSY  05/16/2020   Procedure: BIOPSY;  Surgeon: Lavena Bullion, DO;  [facility];  Service: Gastroenterology;;  . CARDIAC CATHETERIZATION  04-24-11/  DR Rogue Jury ARIDA   MILD NONOBSTRUCTIVE CAD, [place]MA CARDIAC OUTPUT  . CARDIOVASCULAR STRESS TEST  2007  . CATARACT EXTRACTION W/ INTRAOCULAR LENS  IMPLANT, BILATERAL Bilateral ~ 2010  . CYSTOSCOPY  12/25/2011   Procedure: CYSTOSCOPY;  Surgeon: Molli Hazard, MD;  Location: [facility];  Service: Urology;  Laterality: N/A;  needs intubation and to be paralyzed   . CYSTOSCOPY W/ RETROGRADES  10/30/2011   Procedure: CYSTOSCOPY WITH RETROGRADE PYELOGRAM;  Surgeon: Molli Hazard, MD;  Location[facility];  Service: Urology;  Laterality: Bilateral;  CYSTOSCOPY POSS TURBT BILATERAL RETROGRADE PYLEOGRAM   . CYSTOSCOPY W/ RETROGRADES  11/30/2012   Procedure: CYSTOSCOPY WITH RETROGRADE PYELOGRAM;  Surgeon: Molli Hazard, MD;  Location: [facility];  Service: Urology;  Laterality: Bilateral;  Flexible cystoscopy.  . CYSTOSCOPY WITH BIOPSY  11/30/2012   Procedure: CYSTOSCOPY WITH BIOPSY;  Surgeon: Molli Hazard, MD;  Location: [facility];  Service: Urology;  Laterality: N/A;  . ESOPHAGOGASTRODUODENOSCOPY (EGD) WITH PROPOFOL N/A  05/16/2020   Procedure: ESOPHAGOGASTRODUODENOSCOPY (EGD) WITH PROPOFOL;  Surgeon: Lavena Bullion, DO;  [facility];  Service: Gastroenterology;  Laterality: N/A;  . INCISION AND DRAINAGE FOOT Left ~ 2005   LEFT FOOT DUE TO INFECTION FROM  NAIL PUNCTURE INJURY  . LUMBAR LAMINECTOMY/DECOMPRESSION MICRODISCECTOMY N/A 10/18/2016   Procedure: LEFT AND CENTRAL L4-5 LUMBAR LAMINECTOMY WITH RESECTION OF SYNOVIAL CYST;  Surgeon: Jessy Oto, MD;  Location: [place];  Service: Orthopedics;  Laterality: N/A;  . ORIF FEMUR FRACTURE Left 04/19/2020   Procedure: OPEN REDUCTION INTERNAL FIXATION FEMORAL SHAFT FRACTURE;  Surgeon: Shona Needles, MD;  Location: [place];  Service: Orthopedics;  Laterality: Left;  . PENILE PROSTHESIS IMPLANT  1990s  . PERIPHERAL VASCULAR CATHETERIZATION N/A 02/14/2016   Procedure: Abdominal Aortogram w/Lower Extremity;  Surgeon: Wellington Hampshire, MD;  Location: [place] [facility];  Service: Cardiovascular;  Laterality: N/A;  . PERIPHERAL VASCULAR CATHETERIZATION Left 02/28/2016   Procedure: Peripheral Vascular Balloon Angioplasty;  Surgeon: Wellington Hampshire, MD;  Location: [place] [facility];  Service: Cardiovascular;  Laterality: Left;  left peroneal and popliteal artery  . [place]  . TOTAL HIP ARTHROPLASTY Left 12/27/2016   Procedure: LEFT TOTAL HIP ARTHROPLASTY ANTERIOR APPROACH;  Surgeon: Mcarthur Rossetti, MD;  [facility];  Service: Orthopedics;  Laterality: Left;  . TRANSURETHRAL RESECTION OF BLADDER TUMOR  10/30/2011   Procedure: TRANSURETHRAL RESECTION OF BLADDER TUMOR (TURBT);  Surgeon: Molli Hazard, MD;  Location: [facility];  Service: Urology;  Laterality: N/A;  . TRANSURETHRAL RESECTION OF BLADDER TUMOR  12/25/2011   Procedure: TRANSURETHRAL RESECTION OF BLADDER TUMOR (TURBT);  Surgeon: Molli Hazard, MD;  Location: [facility];  Service: Urology;  Laterality: N/A;  need long  gyrus instruments   . VASECTOMY  1990s    Family History  Problem Relation Age of Onset  . Diabetes Mother   . Heart disease Brother   . Heart disease Brother   . Heart disease Brother   . Heart disease Brother   . Heart disease Brother   . Heart disease Brother   . Lung cancer Brother     Social History:  reports that he quit smoking about 51 years ago. His smoking use included cigarettes. He has a 44.00 pack-year smoking history. He has never used smokeless tobacco. He reports current alcohol use. He reports that he does not use drugs.  Allergies:  Allergies  Allergen Reactions  . Actos [Pioglitazone Hydrochloride] Other (See Comments)    Held 2012 bladder cancer  . Aspirin Other (See Comments)    Held 2012 due to hematuria and bruising.   . Ace Inhibitors Cough  . Bactrim Rash and Other (See Comments)    Rash, presumed allergy  . Gabapentin Nausea And Vomiting    Upset stomach and diarrhea - tolerates low doses  . Lyrica [Pregabalin] Swelling    swelling  . Pravastatin Swelling    Swelling of feet  . Sulfa Drugs Cross Reactors Rash    Medications:  Prior to Admission:  Medications Prior to  Admission  Medication Sig Dispense Refill Last Dose  . acetaminophen (TYLENOL) 325 MG tablet Take 1-2 tablets (325-650 mg total) by mouth every 4 (four) hours as needed for mild pain.   07/30/2020 at Unknown time  . Apremilast 30 MG TABS Take 1 tablet by mouth daily. (Patient taking differently: Take 30 mg by mouth daily. )   07/30/2020 at Unknown time  . atorvastatin (LIPITOR) 10 MG tablet Take 1 tablet (10 mg total) by mouth daily. 90 tablet 3 07/30/2020 at Unknown time  . budesonide-formoterol (SYMBICORT) 160-4.5 MCG/ACT inhaler Inhale 2 puffs into the lungs 2 (two) times daily as needed (shortness of breath). 1 Inhaler 12 Past Month at Unknown time  . clobetasol (TEMOVATE) 0.05 % external solution Apply 1 application topically daily as needed (rash).    Past Month at Unknown time   . docusate sodium (COLACE) 100 MG capsule Take 1 capsule (100 mg total) by mouth 3 (three) times daily. (Patient taking differently: Take 100 mg by mouth daily as needed for moderate constipation. ) 10 capsule 0 Past Month at Unknown time  . escitalopram (LEXAPRO) 5 MG tablet Take 1 tablet (5 mg total) by mouth daily. 90 tablet 1 07/31/2020 at Unknown time  . Homeopathic Products (MURINE EAR WAX RELIEF) SOLN Place 10 drops in ear(s) once a week.     . insulin glargine (LANTUS) 100 UNIT/ML Solostar Pen Inject 10 Units into the skin 2 (two) times daily. 15 mL 11 07/30/2020 at Unknown time  . Multiple Vitamin (MULTIVITAMIN WITH MINERALS) TABS tablet Take 1 tablet by mouth daily.   07/31/2020 at Unknown time  . omeprazole (PRILOSEC) 40 MG capsule Take 1 capsule (40 mg total) by mouth daily. 30 capsule 5 07/31/2020 at Unknown time  . polyethylene glycol (MIRALAX / GLYCOLAX) 17 g packet Take 17 g by mouth daily. (Patient taking differently: Take 17 g by mouth daily as needed for severe constipation. ) 14 each 0 Past Month at Unknown time  . senna (SENOKOT) 8.6 MG TABS tablet Take 2 tablets (17.2 mg total) by mouth at bedtime. (Patient taking differently: Take 2 tablets by mouth at bedtime as needed for mild constipation. ) 120 tablet 0 Past Month at Unknown time  . tamsulosin (FLOMAX) 0.4 MG CAPS capsule Take 2 capsules (0.8 mg total) by mouth daily after supper. 60 capsule 3 07/30/2020 at Unknown time  . tiotropium (SPIRIVA) 18 MCG inhalation capsule Place 1 capsule (18 mcg total) into inhaler and inhale daily. 30 capsule 12 07/31/2020 at Unknown time  . traMADol (ULTRAM) 50 MG tablet Take 1 tablet (50 mg total) by mouth every 6 (six) hours. (Patient taking differently: Take 50 mg by mouth every 6 (six) hours as needed for moderate pain. ) 20 tablet 0 Past Month at Unknown time  . calcium carbonate (OS-CAL - DOSED IN MG OF ELEMENTAL CALCIUM) 1250 (500 Ca) MG tablet Take 1 tablet (500 mg of elemental calcium total)  by mouth daily with breakfast. (Patient not taking: Reported on 06/09/2020) 30 tablet 0 Not Taking at Unknown time  . cephALEXin (KEFLEX) 500 MG capsule Take 1 capsule (500 mg total) by mouth 2 (two) times daily. (Patient not taking: Reported on 07/31/2020) 14 capsule 0 Not Taking at Unknown time  . HYDROcodone-acetaminophen (NORCO/VICODIN) 5-325 MG tablet Take 1 tablet by mouth every 6 (six) hours as needed for moderate pain. (Patient not taking: Reported on 06/09/2020) 30 tablet 0 Not Taking at Unknown time  . melatonin 5 MG TABS Take 2  tablets (10 mg total) by mouth at bedtime as needed (sleep). (Patient not taking: Reported on 06/09/2020) 30 tablet 0 Not Taking at Unknown time  . methocarbamol (ROBAXIN) 500 MG tablet Take 1 tablet (500 mg total) by mouth every 6 (six) hours as needed for muscle spasms. (Patient not taking: Reported on 06/09/2020) 30 tablet 0 Not Taking at Unknown time  . umeclidinium bromide (INCRUSE ELLIPTA) 62.5 MCG/INH AEPB Inhale 1 puff into the lungs daily. (Patient not taking: Reported on 07/31/2020) 1 each 0 Not Taking at Unknown time    Results for orders placed or performed during the hospital encounter of 07/31/20 (from the past 48 hour(s))  CBC     Status: Abnormal   Collection Time: 07/31/20  2:20 PM  Result Value Ref Range   WBC 12.1 (H) 4.0 - 10.5 K/uL   RBC 4.06 (L) 4.22 - 5.81 MIL/uL   Hemoglobin 12.7 (L) 13.0 - 17.0 g/dL   HCT 40.1 39 - 52 %   MCV 98.8 80.0 - 100.0 fL   MCH 31.3 26.0 - 34.0 pg   MCHC 31.7 30.0 - 36.0 g/dL   RDW 14.5 11.5 - 15.5 %   Platelets 305 150 - 400 K/uL   nRBC 0.0 0.0 - 0.2 %    Comment: Performed at [facility], [place] [address] [place], [place] [address]  Comprehensive metabolic panel     Status: Abnormal   Collection Time: 07/31/20  2:20 PM  Result Value Ref Range   Sodium 137 135 - 145 mmol/L   Potassium 4.7 3.5 - 5.1 mmol/L   Chloride 99 98 - 111 mmol/L   CO2 30 22 - 32 mmol/L   Glucose, Bld 150 (H) 70 - 99  mg/dL    Comment: Glucose reference range applies only to samples taken after fasting for at least 8 hours.   BUN 46 (H) 8 - 23 mg/dL   Creatinine, Ser 1.94 (H) 0.61 - 1.24 mg/dL   Calcium 8.4 (L) 8.9 - 10.3 mg/dL   Total Protein 7.4 6.5 - 8.1 g/dL   Albumin 3.3 (L) 3.5 - 5.0 g/dL   AST 14 (L) 15 - 41 U/L   ALT 11 0 - 44 U/L   Alkaline Phosphatase 80 38 - 126 U/L   Total Bilirubin 0.5 0.3 - 1.2 mg/dL   GFR calc non Af Amer 30 (L) >60 mL/min   GFR calc Af Amer 35 (L) >60 mL/min   Anion gap 8 5 - 15    Comment: Performed at [facility], [place] [address] [place],  [address]  SARS Coronavirus 2 by RT PCR (hospital order, performed in [facility] hospital lab) Nasopharyngeal Nasopharyngeal Swab     Status: None   Collection Time: 07/31/20  2:20 PM   Specimen: Nasopharyngeal Swab  Result Value Ref Range   SARS Coronavirus 2 NEGATIVE NEGATIVE    Comment: (NOTE) SARS-CoV-2 target nucleic acids are NOT DETECTED.  The SARS-CoV-2 RNA is generally detectable in upper and lower respiratory specimens during the acute phase of infection. The lowest concentration of SARS-CoV-2 viral copies this assay can detect is 250 copies / mL. A negative result does not preclude SARS-CoV-2 infection and should not be used as the sole basis for treatment or other patient management decisions.  A negative result may occur with improper specimen collection / handling, submission of specimen other than nasopharyngeal swab, presence of viral mutation(s) within the areas targeted by this assay, and inadequate number of  viral copies (<250 copies / mL). A negative result must be combined with clinical observations, patient history, and epidemiological information.  Fact Sheet for Patients:   StrictlyIdeas.no  Fact Sheet for Healthcare Providers: BankingDealers.co.za  This test is not yet approved or  cleared by the [place] FDA and has  been authorized for detection and/or diagnosis of SARS-CoV-2 by FDA under an Emergency Use Authorization (EUA).  This EUA will remain in effect (meaning this test can be used) for the duration of the COVID-19 declaration under Section 564(b)(1) of the Act, 21 U.S.C. section 360bbb-3(b)(1), unless the authorization is terminated or revoked sooner.  Performed at [facility], [place] [address] [place], [place] [address]   Surgical PCR screen     Status: None   Collection Time: 07/31/20  4:05 PM   Specimen: Nasal Mucosa; Nasal Swab  Result Value Ref Range   MRSA, PCR NEGATIVE NEGATIVE   Staphylococcus aureus NEGATIVE NEGATIVE    Comment: (NOTE) The Xpert SA Assay (FDA approved for NASAL specimens in patients 84 years of age and older), is one component of a comprehensive surveillance program. It is not intended to diagnose infection nor to guide or monitor treatment. Performed at [facility], [place] [address] [place], [place] [address]   Urinalysis, Routine w reflex microscopic Nasal Mucosa     Status: Abnormal   Collection Time: 07/31/20  4:59 PM  Result Value Ref Range   Color, Urine YELLOW YELLOW   APPearance TURBID (A) CLEAR   Specific Gravity, Urine 1.012 1.005 - 1.030   pH 6.0 5.0 - 8.0   Glucose, UA NEGATIVE NEGATIVE mg/dL   Hgb urine dipstick MODERATE (A) NEGATIVE   Bilirubin Urine NEGATIVE NEGATIVE   Ketones, ur NEGATIVE NEGATIVE mg/dL   Protein, ur 100 (A) NEGATIVE mg/dL   Nitrite NEGATIVE NEGATIVE   Leukocytes,Ua MODERATE (A) NEGATIVE   RBC / HPF 21-50 0 - 5 RBC/hpf   WBC, UA >50 (H) 0 - 5 WBC/hpf   Bacteria, UA MANY (A) NONE SEEN   Squamous Epithelial / LPF 0-5 0 - 5   WBC Clumps PRESENT     Comment: Performed at [facility], [place] [address], [place], [place] [address]  CBC     Status: Abnormal   Collection Time: 07/31/20  4:59 PM  Result Value Ref Range   WBC 12.6 (H) 4.0 - 10.5 K/uL   RBC 3.95 (L) 4.22 -  5.81 MIL/uL   Hemoglobin 12.3 (L) 13.0 - 17.0 g/dL   HCT 39.1 39 - 52 %   MCV 99.0 80.0 - 100.0 fL   MCH 31.1 26.0 - 34.0 pg   MCHC 31.5 30.0 - 36.0 g/dL   RDW 14.5 11.5 - 15.5 %   Platelets 286 150 - 400 K/uL   nRBC 0.0 0.0 - 0.2 %    Comment: Performed at [facility], [place] [address], [place], [place] [address]  Creatinine, serum     Status: Abnormal   Collection Time: 07/31/20  4:59 PM  Result Value Ref Range   Creatinine, Ser 1.99 (H) 0.61 - 1.24 mg/dL   GFR calc non Af Amer 29 (L) >60 mL/min   GFR calc Af Amer 34 (L) >60 mL/min    Comment: Performed at [facility], [place] [address] [place], [place] [address]  Surgical pcr screen     Status: None   Collection Time: 07/31/20 10:18 PM   Specimen: Nasal Mucosa; Nasal Swab  Result Value Ref Range   MRSA, PCR NEGATIVE NEGATIVE   Staphylococcus aureus NEGATIVE NEGATIVE    Comment: (NOTE) The Xpert SA Assay (FDA approved for NASAL specimens in patients 63 years of age and older), is one component of a comprehensive surveillance program. It is not intended to diagnose infection nor to guide or monitor treatment. Performed at [facility], [place] [address] [place], [place] [address]   Glucose, capillary     Status: Abnormal   Collection Time: 08/01/20 12:24 AM  Result Value Ref Range   Glucose-Capillary 124 (H) 70 - 99 mg/dL    Comment: Glucose reference range applies only to samples taken after fasting for at least 8 hours.  Glucose, capillary     Status: None   Collection Time: 08/01/20  6:28 AM  Result Value Ref Range   Glucose-Capillary 97 70 - 99 mg/dL    Comment: Glucose reference range applies only to samples taken after fasting for at least 8 hours.  Glucose, capillary     Status: None   Collection Time: 08/01/20 12:26 PM  Result Value Ref Range   Glucose-Capillary 81 70 - 99 mg/dL    Comment: Glucose reference range applies only to samples taken after fasting for at least 8  hours.    DG Pelvis 1-2 Views  Result Date: 07/31/2020 CLINICAL DATA:  Pelvic pain after fall. EXAM: PELVIS - 1-2 VIEW COMPARISON:  April 18, 2020. FINDINGS: Status post left total hip arthroplasty and surgical internal fixation of old proximal left femoral shaft fracture. No acute abnormality seen involving the right hip or pelvis. IMPRESSION: No acute abnormality seen in the pelvis. Electronically Signed   By: Marijo Conception M.D.   On: 07/31/2020 12:56   DG Tibia/Fibula Left  Result Date: 07/31/2020 CLINICAL DATA:  Left leg pain after fall. EXAM: LEFT TIBIA AND FIBULA - 2 VIEW COMPARISON:  None. FINDINGS: There is no evidence of fracture or other focal bone lesions. Soft tissues are unremarkable. IMPRESSION: Negative. Electronically Signed   By: Marijo Conception M.D.   On: 07/31/2020 12:51   DG Knee Complete 4 Views Left  Result Date: 07/31/2020 CLINICAL DATA:  Left knee pain after fall. EXAM: LEFT KNEE - COMPLETE 4+ VIEW COMPARISON:  None. FINDINGS: No evidence of fracture, dislocation, or joint effusion. No evidence of arthropathy or other focal bone abnormality. Soft tissues are unremarkable. IMPRESSION: Negative. Electronically Signed   By: Marijo Conception M.D.   On: 07/31/2020 12:52   DG Femur Portable Min 2 Views Left  Result Date: 07/31/2020 CLINICAL DATA:  Left leg pain after fall EXAM: LEFT FEMUR PORTABLE 2 VIEWS COMPARISON:  May 11, 2020. FINDINGS: Status post left total hip arthroplasty and surgical internal fixation of old proximal left femoral shaft fracture. There is now noted new mildly angulated oblique fracture involving the distal femoral shaft around the 2 most distal screws of the fixation plate. IMPRESSION: New mildly angulated oblique fracture involving the distal left femoral shaft around the 2 most distal screws of the previously placed internal fixation plate. Electronically Signed   By: Marijo Conception M.D.   On: 07/31/2020 12:54    ROS No recent fever, bleeding  abnormalities, urologic dysfunction, GI problems, or weight gain. Blood pressure 121/68, pulse 72, temperature 97.9 F (36.6 C), temperature source Oral, resp. rate 16, height 6\' 2"  (1.88 m), weight 95.3 kg, SpO2 93 %. Physical Exam NCAT, hard of hearing LLE Tender and swollen distal thigh without  a wound  Edema/ swelling controlled distally  Sens: DPN, SPN, TN intact  Motor: EHL, FHL, and lessor toe ext and flex all intact grossly  Brisk cap refill, warm to touch  Assessment/Plan: Left peri-implant distal femur fracture with hardware loosening   I discussed with the patient the risks and benefits of surgery, including the possibility of infection, nerve injury, vessel injury, wound breakdown, arthritis, symptomatic hardware, DVT/ PE, loss of motion, malunion, nonunion, and need for further surgery among others.  We also specifically discussed the need to both remove some of his plate hardware as well as place new fixation to overlap with the old. He acknowledged these risks and wished to proceed.    Altamese [place], MD [facility], [facility] [phone]  08/01/2020  2:26 PM

## 2020-08-01 NOTE — TOC Progression Note (Signed)
Transition of Care [facility]) - Progression Note    Patient Details  Name: [name] Small MRN: [id number] Date of Birth: [birth year]  Transition of Care [facility] CM/SW Contact  Sharin Mons, RN Phone Number: [phone] 08/01/2020, 11:03 AM  Clinical Narrative:    NCM received text from nurse pt's wife requesting to speak with NCM. NCM called pt's room and spoke with wife.Wife states pt will probably need SNF/REHAB  @ d/c and wanted to share facilities of choice: [facility] and [facility]. Pt with L femur Fx,  Surgery planned for today, 07/31/2020.  TOC team will continue to monitor for needs.....  Expected Discharge Plan: [place] Barriers to Discharge: Continued Medical Work up  Expected Discharge Plan and Services Expected Discharge Plan: [place] In-house Referral: Clinical Social Work Discharge Planning Services: CM Consult Post Acute Care Choice: [place] Living arrangements for the past 2 months: Single Family Home                                       Social Determinants of Health (SDOH) Interventions    Readmission Risk Interventions Readmission Risk Prevention Plan 04/21/2020  Transportation Screening Complete  PCP or Specialist Appt within 5-7 Days Complete  Home Care Screening Complete  Medication Review (RN CM) Complete  Some recent data might be hidden

## 2020-08-02 ENCOUNTER — Encounter (HOSPITAL_COMMUNITY): Payer: Self-pay | Admitting: Orthopedic Surgery

## 2020-08-02 LAB — BASIC METABOLIC PANEL
Anion gap: 13 (ref 5–15)
BUN: 60 mg/dL — ABNORMAL HIGH (ref 8–23)
CO2: 21 mmol/L — ABNORMAL LOW (ref 22–32)
Calcium: 7.7 mg/dL — ABNORMAL LOW (ref 8.9–10.3)
Chloride: 96 mmol/L — ABNORMAL LOW (ref 98–111)
Creatinine, Ser: 2.55 mg/dL — ABNORMAL HIGH (ref 0.61–1.24)
GFR calc Af Amer: 25 mL/min — ABNORMAL LOW (ref >60)
GFR calc non Af Amer: 21 mL/min — ABNORMAL LOW (ref >60)
Glucose, Bld: 510 mg/dL (ref 70–99)
Potassium: 5.6 mmol/L — ABNORMAL HIGH (ref 3.5–5.1)
Sodium: 130 mmol/L — ABNORMAL LOW (ref 135–145)

## 2020-08-02 LAB — GLUCOSE, CAPILLARY
Glucose-Capillary: 337 mg/dL — ABNORMAL HIGH (ref 70–99)
Glucose-Capillary: 477 mg/dL — ABNORMAL HIGH (ref 70–99)
Glucose-Capillary: 54 mg/dL — ABNORMAL LOW (ref 70–99)
Glucose-Capillary: 44 mg/dL — CL (ref 70–99)
Glucose-Capillary: 89 mg/dL (ref 70–99)

## 2020-08-02 LAB — CBC
HCT: 35.6 % — ABNORMAL LOW (ref 39.0–52.0)
Hemoglobin: 11.1 g/dL — ABNORMAL LOW (ref 13.0–17.0)
MCH: 31.4 pg (ref 26.0–34.0)
MCHC: 31.2 g/dL (ref 30.0–36.0)
MCV: 100.8 fL — ABNORMAL HIGH (ref 80.0–100.0)
Platelets: 314 10*3/uL (ref 150–400)
RBC: 3.53 MIL/uL — ABNORMAL LOW (ref 4.22–5.81)
RDW: 14.6 % (ref 11.5–15.5)
WBC: 10.5 10*3/uL (ref 4.0–10.5)
nRBC: 0 % (ref 0.0–0.2)

## 2020-08-02 LAB — VITAMIN D 25 HYDROXY (VIT D DEFICIENCY, FRACTURES): Vit D, 25-Hydroxy: 29.76 ng/mL — ABNORMAL LOW (ref 30–100)

## 2020-08-02 MED ORDER — INSULIN ASPART 100 UNIT/ML IV SOLN
10.0000 [IU] | Freq: Once | INTRAVENOUS | Status: AC
  Administered 2020-08-02: 10 [IU] via INTRAVENOUS

## 2020-08-02 MED ORDER — LACTATED RINGERS IV BOLUS
1000.0000 mL | Freq: Once | INTRAVENOUS | Status: AC
  Administered 2020-08-02: 1000 mL via INTRAVENOUS

## 2020-08-02 MED ORDER — INSULIN ASPART 100 UNIT/ML ~~LOC~~ SOLN
0.0000 [IU] | Freq: Three times a day (TID) | SUBCUTANEOUS | Status: DC
  Administered 2020-08-02: 15 [IU] via SUBCUTANEOUS
  Administered 2020-08-03: 5 [IU] via SUBCUTANEOUS
  Administered 2020-08-03: 3 [IU] via SUBCUTANEOUS
  Administered 2020-08-03: 8 [IU] via SUBCUTANEOUS
  Administered 2020-08-04: 5 [IU] via SUBCUTANEOUS
  Administered 2020-08-04 – 2020-08-05 (×3): 8 [IU] via SUBCUTANEOUS
  Administered 2020-08-05 (×2): 2 [IU] via SUBCUTANEOUS
  Administered 2020-08-06: 5 [IU] via SUBCUTANEOUS
  Administered 2020-08-06: 11 [IU] via SUBCUTANEOUS
  Administered 2020-08-07 (×2): 5 [IU] via SUBCUTANEOUS

## 2020-08-02 MED ORDER — DEXTROSE 50 % IV SOLN
50.0000 mL | Freq: Once | INTRAVENOUS | Status: AC
  Administered 2020-08-02: 50 mL via INTRAVENOUS
  Filled 2020-08-02: qty 50

## 2020-08-02 MED ORDER — DOXYCYCLINE HYCLATE 100 MG PO TABS
100.0000 mg | ORAL_TABLET | Freq: Two times a day (BID) | ORAL | Status: DC
  Administered 2020-08-02 – 2020-08-08 (×13): 100 mg via ORAL
  Filled 2020-08-02 (×13): qty 1

## 2020-08-02 MED ORDER — MELATONIN 3 MG PO TABS
6.0000 mg | ORAL_TABLET | Freq: Every evening | ORAL | Status: DC | PRN
  Administered 2020-08-03 – 2020-08-04 (×2): 6 mg via ORAL
  Filled 2020-08-02 (×2): qty 2

## 2020-08-02 MED ORDER — SODIUM ZIRCONIUM CYCLOSILICATE 5 G PO PACK
5.0000 g | PACK | Freq: Once | ORAL | Status: AC
  Administered 2020-08-02: 5 g via ORAL
  Filled 2020-08-02: qty 1

## 2020-08-02 MED ORDER — INSULIN ASPART 100 UNIT/ML ~~LOC~~ SOLN
0.0000 [IU] | Freq: Every day | SUBCUTANEOUS | Status: DC
  Administered 2020-08-03: 2 [IU] via SUBCUTANEOUS
  Administered 2020-08-04: 3 [IU] via SUBCUTANEOUS
  Administered 2020-08-05: 2 [IU] via SUBCUTANEOUS

## 2020-08-02 NOTE — Progress Notes (Signed)
CRITICAL VALUE ALERT  Critical Value: cbg 44  Date & Time Notied:  1722  Provider Notified: Dr. Avon Gully  Orders Received/Actions taken: D50 21ml

## 2020-08-02 NOTE — Evaluation (Signed)
Occupational Therapy Evaluation Patient Details Name: [name] Small MRN: [id number] DOB: 1928/10/22 Today's Date: 08/02/2020    History of Present Illness Pt is an 86 y/o male who sustained twisting injury with fall resulting in left leg pain and inability to bear weight. Urinary tract infection with multidrug resistant E Coli. X-rays demonstrate fracture below the plate which was used to fix his previous fracture below a hemiarthroplasty. Pt underwent surgical repair with TDWB and posterior hip precautions   Clinical Impression   PTA, pt lives with wife who has been assisting with ADLs and mobility since June 2021. Prior to June, pt modified independent with ADLs and mobility using walker. Pt presents now s/p surgery with deficits in L LE strength, endurance, cardiopulmonary tolerance, and standing balance. Pt requires Mod A for pivot transfers with cues for safe sequencing, Supervision for UB ADLs, and up to Max A for LB ADLs. Pt required 3 L O2 to maintain SpO2 >90% during session. Pt with difficulty recalling posterior hip precautions during session, but earnestly avoiding putting weight through surgical leg. Wife present and assisting pt throughout session. Both interested in rehab to maximize independence prior to return home.     Follow Up Recommendations  SNF;Supervision/Assistance - 24 hour    Equipment Recommendations  3 in 1 bedside commode;Other (comment) (RW)    Recommendations for Other Services       Precautions / Restrictions Precautions Precautions: Fall;Posterior Hip Precaution Booklet Issued:  (Sheet hanging in room) Restrictions Weight Bearing Restrictions: Yes LLE Weight Bearing: Touchdown weight bearing      Mobility Bed Mobility Overal bed mobility: Needs Assistance Bed Mobility: Supine to Sit     Supine to sit: Mod assist;HOB elevated     General bed mobility comments: Mod A for advancement of L LE to EOB due to difficulty moving. Pt with good  trunk/UB strength to use bed rails  Transfers Overall transfer level: Needs assistance Equipment used: Rolling walker (2 wheeled) Transfers: Sit to/from [facility] Sit to Stand: Min assist;From elevated surface Stand pivot transfers: Mod assist;+2 safety/equipment;From elevated surface       General transfer comment: Min A for steadying in standing with elevated bed. Pt wife assisted but 2 person assist may not have been neccessary. Pt with preference for "shimmying" R foot for transfer. Difficulty with reliance of UB strength due to L hand IV    Balance Overall balance assessment: Needs assistance;History of Falls Sitting-balance support: Single extremity supported;Feet supported Sitting balance-Leahy Scale: Fair     Standing balance support: Bilateral upper extremity supported;During functional activity Standing balance-Leahy Scale: Poor Standing balance comment: reliance on UE support with RW                           ADL either performed or assessed with clinical judgement   ADL Overall ADL's : Needs assistance/impaired Eating/Feeding: Set up;Sitting Eating/Feeding Details (indicate cue type and reason): Wife assisting pt in setup for breakfast, cutting up omelet, preparing coffee Grooming: Supervision/safety;Sitting   Upper Body Bathing: Supervision/ safety;Sitting   Lower Body Bathing: Moderate assistance;Sit to/from stand;Sitting/lateral leans   Upper Body Dressing : Supervision/safety;Sitting   Lower Body Dressing: Maximal assistance;Sitting/lateral leans;Sit to/from stand Lower Body Dressing Details (indicate cue type and reason): Max A to don socks. would benefit from AE education during next session to ease caregiver burden Toilet Transfer: Moderate assistance;+2 for safety/equipment;Stand-pivot;RW Toilet Transfer Details (indicate cue type and reason): Simulated to recliner. Mod A (+  2 for safety, but may not have been neccesary. Wife  with desire to assist) with pivot to RW, transferred to strong side, cues for sequencing with pt earnestly attempting to maintain WB precautions Toileting- Clothing Manipulation and Hygiene: Maximal assistance;Sitting/lateral lean;Sit to/from stand         General ADL Comments: Pt with decreased L LE strength, endurance, cardiopulmonary tolerance, dynamic standing balance and knowledge of DME/AE use     Vision Baseline Vision/History: No visual deficits Patient Visual Report: Other (comment);No change from baseline (has "implants") Vision Assessment?: No apparent visual deficits     Perception     Praxis      Pertinent Vitals/Pain Pain Assessment: Faces Faces Pain Scale: Hurts a little bit Pain Location: L LE with bed mobility  Pain Descriptors / Indicators: Grimacing;Operative site guarding Pain Intervention(s): Monitored during session;Limited activity within patient's tolerance     Hand Dominance Left   Extremity/Trunk Assessment Upper Extremity Assessment Upper Extremity Assessment: Overall WFL for tasks assessed   Lower Extremity Assessment Lower Extremity Assessment: Defer to PT evaluation   Cervical / Trunk Assessment Cervical / Trunk Assessment: Normal   Communication Communication Communication: HOH   Cognition Arousal/Alertness: Awake/alert Behavior During Therapy: WFL for tasks assessed/performed Overall Cognitive Status: Within Functional Limits for tasks assessed Area of Impairment: Memory                     Memory: Decreased short-term memory;Decreased recall of precautions         General Comments: A&Ox4. Some difficulties recalling posterior hip precautions, following safety commands but overall functional   General Comments  Pt received on 3 L O2 (pt wife reports he wears 2 L O2 when sleeping). On RA, desats to 83%, 87% after transfer but quickly recovers to 93% with return of 3 L O2. HR WFL)    Exercises     Shoulder Instructions       Home Living Family/patient expects to be discharged to:: Private residence Living Arrangements: Spouse/significant other Available Help at Discharge: Family;Available 24 hours/day Type of Home: House Home Access: Ramped entrance     Home Layout: One level     Bathroom Shower/Tub: Occupational psychologist: Handicapped height     Home Equipment: [place] held shower head;Grab bars - tub/shower;Cane - single point;Wheelchair - manual;Other (comment) Gilford Rile called the Stander, lift chair)   Additional Comments: sleeps in lift chair, may or may not have RW      Prior Functioning/Environment Level of Independence: Needs assistance  Gait / Transfers Assistance Needed: Uses "Stander" walker in the home for mobility since June ADL's / Homemaking Assistance Needed: Prior to June, Pt modified INdependent for ADLs. But recently, wife has been assisting with ADLs.            OT Problem List: Decreased strength;Decreased activity tolerance;Impaired balance (sitting and/or standing);Decreased safety awareness;Decreased knowledge of use of DME or AE;Cardiopulmonary status limiting activity;Pain      OT Treatment/Interventions: Self-care/ADL training;Therapeutic exercise;Energy conservation;DME and/or AE instruction;Therapeutic activities;Patient/family education    OT Goals(Current goals can be found in the care plan section) Acute Rehab OT Goals Patient Stated Goal: would like to go to rehab to increase strength/independence OT Goal Formulation: With patient/family Time For Goal Achievement: 08/16/20 Potential to Achieve Goals: Good ADL Goals Pt Will Perform Lower Body Bathing: with supervision;sit to/from stand;sitting/lateral leans;with adaptive equipment Pt Will Perform Lower Body Dressing: with supervision;sitting/lateral leans;sit to/from stand;with adaptive equipment Pt Will Transfer to  Toilet: with min guard assist;ambulating;bedside commode Pt Will Perform  Toileting - Clothing Manipulation and hygiene: with supervision;sit to/from stand;sitting/lateral leans Additional ADL Goal #1: Pt to verbalize 3/3 posterior hip precautions Independently in order to maximize healing and safety during ADLs Additional ADL Goal #2: Pt to verbalize at least 2 fall prevention strategies to maximize safety at home.  OT Frequency: Min 2X/week   Barriers to D/C:            Co-evaluation              AM-PAC OT "6 Clicks" Daily Activity     Outcome Measure Help from another person eating meals?: A Little Help from another person taking care of personal grooming?: A Little Help from another person toileting, which includes using toliet, bedpan, or urinal?: A Lot Help from another person bathing (including washing, rinsing, drying)?: A Lot Help from another person to put on and taking off regular upper body clothing?: A Little Help from another person to put on and taking off regular lower body clothing?: A Lot 6 Click Score: 15   End of Session Equipment Utilized During Treatment: Gait belt;Rolling walker;Oxygen Nurse Communication: Mobility status;Other (comment) (L hand IV)  Activity Tolerance: Patient tolerated treatment well Patient left: in chair;with call bell/phone within reach;with chair alarm set;with family/visitor present                   Time: 4332-9518 OT Time Calculation (min): 30 min Charges:  OT General Charges $OT Visit: 1 Visit OT Evaluation $OT Eval Moderate Complexity: 1 Mod OT Treatments $Therapeutic Activity: 8-22 mins  Layla Maw, OTR/L  Layla Maw 08/02/2020, 8:36 AM

## 2020-08-02 NOTE — Progress Notes (Signed)
Inpatient Diabetes Program Recommendations  AACE/ADA: New Consensus Statement on Inpatient Glycemic Control (2015)  Target Ranges:  Prepandial:   less than 140 mg/dL      Peak postprandial:   less than 180 mg/dL (1-2 hours)      Critically ill patients:  140 - 180 mg/dL   Lab Results  Component Value Date   GLUCAP 477 (H) 08/02/2020   HGBA1C 8.6 (H) 07/20/2020    Review of Glycemic Control Results for [name] Small, [name] Small (MRN [id number]) as of 08/02/2020 13:40  Ref. Range 08/01/2020 06:28 08/01/2020 12:26 08/01/2020 21:06 08/02/2020 06:17 08/02/2020 12:33  Glucose-Capillary Latest Ref Range: 70 - 99 mg/dL 97 81 180 (H) 337 (H) 477 (H)  Results for [name] Small, [name] Small (MRN [id number]) as of 08/02/2020 13:40  Ref. Range 08/02/2020 09:52  CO2 Latest Ref Range: 22 - 32 mmol/L 21 (L)  Results for [name] Small, [name] Small (MRN [id number]) as of 08/02/2020 13:40  Ref. Range 08/02/2020 09:52  Anion gap Latest Ref Range: 5 - 15  13   Diabetes history:  DM2 Outpatient Diabetes medications:  Lantus 10 units bid Current orders for Inpatient glycemic control:  Lantus 5 units bid Novolog 0-15 units tid Novolog 0-5 units qhs Decadron 10 mg on 9/21 @ 1507  Inpatient Diabetes Program Recommendations:      Please consider Lantus 10 units bid  Will continue to follow while inpatient.  Thank you, Reche Dixon, RN, BSN Diabetes Coordinator Inpatient Diabetes Program [phone] (team pager from 8a-5p)

## 2020-08-02 NOTE — Progress Notes (Signed)
CRITICAL VALUE ALERT  Critical Value:  [id number] Date & Time Notied:  1130  Provider Notified: 1132  Orders Received/Actions taken: novolog 10units and sliding scale

## 2020-08-02 NOTE — Evaluation (Signed)
Physical Therapy Evaluation Patient Details Name: [name] Small MRN: [id number] DOB: 1927/12/17 Today's Date: 08/02/2020   History of Present Illness  Pt is an 86 y/o male who sustained twisting injury with fall resulting in left leg pain and inability to bear weight. Urinary tract infection with multidrug resistant E Coli. X-rays demonstrate fracture below the plate which was used to fix his previous fracture below a hemiarthroplasty. Pt underwent surgical repair with TDWB and posterior hip precautions  Clinical Impression  Prior to admission, pt lives with his spouse, uses a walker for limited household ambulation and requires assist for ADL's. Pt requiring moderate assist (+2 safety) for stand pivot transfers using a walker; cues provided for maintaining precautions. Treatment limited due to frequent bowel urgency and assisted pt on/off the bedside commode. Recommending SNF at discharge to maximize functional mobility and decrease caregiver burden.     Follow Up Recommendations SNF    Equipment Recommendations  None recommended by PT    Recommendations for Other Services       Precautions / Restrictions Precautions Precautions: Fall;Posterior Hip Precaution Booklet Issued:  (Sheet hanging in room) Restrictions Weight Bearing Restrictions: Yes LLE Weight Bearing: Touchdown weight bearing      Mobility  Bed Mobility               General bed mobility comments: OOB in recliner upon entry  Transfers Overall transfer level: Needs assistance Equipment used: Rolling walker (2 wheeled) Transfers: Sit to/from [facility] Sit to Stand: Mod assist Stand pivot transfers: Mod assist;+2 safety/equipment;From elevated surface       General transfer comment: ModA to rise from recliner and pivot to/from [facility] x 3 (due to bowel urgency). Cues for sliding L foot anteriorly to prevent weightbearing, hand placement  Ambulation/Gait                Stairs             Wheelchair Mobility    Modified Rankin (Stroke Patients Only)       Balance Overall balance assessment: Needs assistance;History of Falls Sitting-balance support: Single extremity supported;Feet supported Sitting balance-Leahy Scale: Fair     Standing balance support: Bilateral upper extremity supported;During functional activity Standing balance-Leahy Scale: Poor Standing balance comment: reliance on UE support with RW                             Pertinent Vitals/Pain Pain Assessment: Faces Faces Pain Scale: Hurts little more Pain Location: LLE with mobility Pain Descriptors / Indicators: Grimacing;Operative site guarding Pain Intervention(s): Limited activity within patient's tolerance;Monitored during session    Home Living Family/patient expects to be discharged to:: Private residence Living Arrangements: Spouse/significant other Available Help at Discharge: Family;Available 24 hours/day Type of Home: House Home Access: Ramped entrance     Home Layout: One level Home Equipment: Shower seat;Hand held shower head;Grab bars - tub/shower;Cane - single point;Wheelchair - manual;Other (comment) Gilford Rile called the Stander, lift chair) Additional Comments: sleeps in lift chair, may or may not have RW    Prior Function Level of Independence: Needs assistance   Gait / Transfers Assistance Needed: Uses "Stander" walker in the home for mobility since June  ADL's / Homemaking Assistance Needed: Prior to June, Pt modified INdependent for ADLs. But recently, wife has been assisting with ADLs.        Hand Dominance   Dominant Hand: Left    Extremity/Trunk Assessment   Upper Extremity  Assessment Upper Extremity Assessment: Overall WFL for tasks assessed    Lower Extremity Assessment Lower Extremity Assessment: LLE deficits/detail LLE Deficits / Details: Femur fx s/p ORIF. Ankle ROM WFL    Cervical / Trunk Assessment Cervical / Trunk  Assessment: Normal  Communication   Communication: HOH  Cognition Arousal/Alertness: Awake/alert Behavior During Therapy: WFL for tasks assessed/performed Overall Cognitive Status: Within Functional Limits for tasks assessed Area of Impairment: Memory                     Memory: Decreased short-term memory;Decreased recall of precautions         General Comments: A&Ox4. Some difficulties recalling posterior hip precautions, following safety commands but overall functional      General Comments      Exercises     Assessment/Plan    PT Assessment Patient needs continued PT services  PT Problem List Decreased strength;Decreased activity tolerance;Decreased balance;Decreased mobility;Pain;Decreased knowledge of precautions       PT Treatment Interventions DME instruction;Gait training;Functional mobility training;Therapeutic activities;Therapeutic exercise;Balance training;Patient/family education;Wheelchair mobility training    PT Goals (Current goals can be found in the Care Plan section)  Acute Rehab PT Goals Patient Stated Goal: would like to go to rehab to increase strength/independence PT Goal Formulation: With patient/family Time For Goal Achievement: 08/16/20 Potential to Achieve Goals: Good    Frequency Min 3X/week   Barriers to discharge        Co-evaluation               AM-PAC PT "6 Clicks" Mobility  Outcome Measure Help needed turning from your back to your side while in a flat bed without using bedrails?: A Little Help needed moving from lying on your back to sitting on the side of a flat bed without using bedrails?: A Little Help needed moving to and from a bed to a chair (including a wheelchair)?: A Lot Help needed standing up from a chair using your arms (e.g., wheelchair or bedside chair)?: A Lot Help needed to walk in hospital room?: A Lot Help needed climbing 3-5 steps with a railing? : Total 6 Click Score: 13    End of Session    Activity Tolerance: Patient limited by pain;Other (comment) (limited by bowel urgency) Patient left: in bed;with call bell/phone within reach;with bed alarm set Nurse Communication: Mobility status PT Visit Diagnosis: Pain;Difficulty in walking, not elsewhere classified (R26.2);Unsteadiness on feet (R26.81);Other abnormalities of gait and mobility (R26.89);History of falling (Z91.81) Pain - Right/Left: Left Pain - part of body: Hip    Time: 2482-5003 PT Time Calculation (min) (ACUTE ONLY): 46 min   Charges:   PT Evaluation $PT Eval Moderate Complexity: 1 Mod PT Treatments $Therapeutic Activity: 23-37 mins          Wyona Almas, PT, DPT Acute Rehabilitation Services Pager [phone] Office [phone]   Deno Etienne 08/02/2020, 11:47 AM

## 2020-08-02 NOTE — Anesthesia Postprocedure Evaluation (Signed)
Anesthesia Post Note  Patient: [name] Small  Procedure(s) Performed: INTRAMEDULLARY (IM) RETROGRADE FEMORAL NAILING (Left Leg Upper)     Patient location during evaluation: PACU Anesthesia Type: General Level of consciousness: awake and alert Pain management: pain level controlled Vital Signs Assessment: post-procedure vital signs reviewed and stable Respiratory status: spontaneous breathing, nonlabored ventilation, respiratory function stable and patient connected to nasal cannula oxygen Cardiovascular status: blood pressure returned to baseline and stable Postop Assessment: no apparent nausea or vomiting Anesthetic complications: no   No complications documented.  Last Vitals:  Vitals:   08/02/20 0800 08/02/20 1300  BP: 115/65 110/62  Pulse: 82 77  Resp: 16 16  Temp: 36.9 C 36.7 C  SpO2: 95% 99%    Last Pain:  Vitals:   08/02/20 1300  TempSrc: Oral  PainSc:                  [place]

## 2020-08-02 NOTE — TOC Progression Note (Signed)
Transition of Care [facility]) - Progression Note    Patient Details  Name: BRILEY SULTON MRN: [id number] Date of Birth: 31-Jan-1928  Transition of Care [facility]) CM/SW [place], [place] Phone Number: 08/02/2020, 11:18 AM  Clinical Narrative:    Pt does not have any bed offers at this time.  TOCTeam will continue to assist for disposition planning.   Expected Discharge Plan: [place] Barriers to Discharge: Continued Medical Work up  Expected Discharge Plan and Services Expected Discharge Plan: [place] In-house Referral: Clinical Social Work Discharge Planning Services: CM Consult Post Acute Care Choice: [place] Living arrangements for the past 2 months: Single Family Home                                       Social Determinants of Health (SDOH) Interventions    Readmission Risk Interventions Readmission Risk Prevention Plan 04/21/2020  Transportation Screening Complete  PCP or Specialist Appt within 5-7 Days Complete  Home Care Screening Complete  Medication Review (RN CM) Complete  Some recent data might be hidden

## 2020-08-02 NOTE — Plan of Care (Signed)

## 2020-08-02 NOTE — TOC CAGE-AID Note (Signed)
Transition of Care [facility]) - CAGE-AID Screening   Patient Details  Name: [name] Small MRN: [id number] Date of Birth: 27-Aug-1928  Transition of Care [facility]) CM/SW Contact:    [name], [place] Phone Number: 08/02/2020, 2:56 PM   Clinical Narrative:  CSW met with pt at bedside. CSW introduced self and explained her role at the hospital.  PT denies alcohol use and substance use. Pt did not need any resources at this time.    CAGE-AID Screening:    Have You Ever Felt You Ought to Cut Down on Your Drinking or Drug Use?: No Have People Annoyed You By Critizing Your Drinking Or Drug Use?: No Have You Felt Bad Or Guilty About Your Drinking Or Drug Use?: No Have You Ever Had a Drink or Used Drugs First Thing In The Morning to Steady Your Nerves or to Get Rid of a Hangover?: No CAGE-AID Score: 0  Substance Abuse Education Offered: Yes     [name], [place] Social Worker [phone]

## 2020-08-02 NOTE — Progress Notes (Addendum)
This Probation officer was informed by NT cbg of 54, glucerna/snacks provided and rechecked 1715, CBG 44, Attending MD has been informed of above and received an order for D50 IV given. And cbg went up to 89. Pt has been asymptomatic throughout the whole hypoglycemic episodes, Denies any lightheadedness,confusion or any discomfort at this time.  Spouse at bedside. All questions and concerns were fully answered.

## 2020-08-02 NOTE — Progress Notes (Addendum)
PROGRESS NOTE    [name] Small  ALP:[id number] DOB: Sep 14, 1928 DOA: 07/31/2020 PCP: Tonia Ghent, MD   Brief Narrative:  This is an 86 year old male with past medical history of HFpEF, CAD, CKD 3, COPD, recurrent UTIs, left femur fracture repair in June, diabetes who presented to the ED with a chief complaint of a fall and left leg pain.  He had been feeling unwell lately with increased urinary urgency over the weekend.  He had a fall on Saturday with subsequent left hip pain but did not come straight to the ED.  Today his occupational therapist was evaluating him and recommended that he come to the ED.  He has had significant pain in his left leg since.  No loss of consciousness, no head trauma.  He is not on any blood thinners.  No other pain. Of note he was found to have a multidrug-resistant E. coli UTI on 9/1 and was prescribed Keflex and found to have recurrent UTI recently at the urology office and was supposed to start Keflex today.  He does admit to urinary urgency and occasionally urinary retention. In ED: afebrile hemodynamically stable on room air.  Notable labs: Glucose 150, creatinine 1.94 (baseline 1.25 in July), WBC 12.1.  Left femur x-ray: New mildly angulated oblique fracture involving the distal left femoral shaft around the 2 most distal screws of the previously placed internal fixation plate.  ED physician discussed with Dr. [name], orthopedics, who recommended admission/transfer to [place]:   Principal Problem:   Femur fracture, left ([place]) Active Problems:   Type 2 diabetes mellitus with diabetic polyneuropathy (HCC)   BPH (benign prostatic hyperplasia)   Acute kidney injury ([place])   Periprosthetic hip fracture   Recurrent UTI   Pressure injury of skin   Left femur fracture s/p mechanical fall with history of recent left femur fracture s/p ORIF by Dr. [name] previously on 04/19/2020 Orthopedic surgery consulted Tentative ORIF 9/21 per  ortho schedule Pain management/DVT ppx per ortho PT/OT to follow  Recurrent UTI with history of multidrug-resistant E. coli UTI Urology recommending doxycycline for 10 days given cultures, every 4 hour bladder scans with in/out cath if greater than 400 cc urine noted  Suspected AKI vs worsening CKD 3b Creatinine 1.9 and was 1.25 in July has been steadily increasing Unclear if worsening baseline function vs more acute process Lab Results  Component Value Date   CREATININE 2.55 (H) 08/02/2020   CREATININE 1.99 (H) 07/31/2020   CREATININE 1.94 (H) 07/31/2020   Profound electrolyte abnormalities in the setting of fluid restriction - Patient has moderate hyponatremia, hyperkalemia, hypochloremia and hyperglycemia with worsening creatinine as above -Continue IV fluids, increase p.o. intake, Lokelma x1, insulin as below  Intermittent urinary retention with history of enlarged prostate  Follows with urology Straight caths at home Continue tamsulosin Every 4 hour bladder scans with in/out cath if greater than 400 cc urine noted  Generalized weakness, likely multifactorial: UTI, debility PT following appreciate insight and recommendations, likely requiring SNF  COPD, not in exacerbation Continue home Spiriva with Breo  HFpEF, not in exacerbation Daily weights and intake/output  Uncontrolled insulin-dependent diabetes mellitus type 2  Increase Lantus to 5 units twice daily, monitor for hypoglycemia Initiate sliding scale insulin - follow for hypoglycemia Lab Results  Component Value Date   HGBA1C 8.6 (H) 07/20/2020   Stage II, sacral decubitus ulcer, POA -Continue bedside wound care  DVT prophylaxis: Heparin Code Status: DNR Family Communication: Wife at  bedside  Status is: Inpatient  Dispo: The patient is from: Home              Anticipated d/c is to: SNF              Anticipated d/c date is: 48 to 72 hours pending clinical course              Patient currently not  medically stable for discharge due to ongoing need for further surgical evaluation in the setting of fracture and mechanical fall.  Consultants:   Ortho  Procedures:   ORIF 08/01/2020 per orthopedics  Antimicrobials:  Perioperatively per orthopedics  Subjective: No acute issues or events overnight, patient remarks on postoperative pain but currently well controlled, otherwise denies nausea, vomiting, diarrhea, constipation, headache, fevers, chills.  Objective: Vitals:   08/01/20 2050 08/02/20 0029 08/02/20 0407 08/02/20 0748  BP: (!) 117/56 (!) 117/50 (!) 110/52   Pulse: 91 94 94 86  Resp: 18 18 18 16   Temp: 97.6 F (36.4 C) 97.8 F (36.6 C) 98.2 F (36.8 C)   TempSrc: Oral Oral Oral   SpO2: 95% 96% 97% 95%  Weight:      Height:        Intake/Output Summary (Last 24 hours) at 08/02/2020 1410 Last data filed at 08/02/2020 1030 Gross per 24 hour  Intake 1790 ml  Output 600 ml  Net 1190 ml   Filed Weights   07/31/20 1030 08/01/20 1330  Weight: 95.3 kg 95.3 kg    Examination:  General:  Pleasantly resting in bed, No acute distress. HEENT:  Normocephalic atraumatic.  Sclerae nonicteric, noninjected.  Extraocular movements intact bilaterally. Neck:  Without mass or deformity.  Trachea is midline. Lungs:  Clear to auscultate bilaterally without rhonchi, wheeze, or rales. Heart:  Regular rate and rhythm.  Without murmurs, rubs, or gallops. Abdomen:  Soft, nontender, nondistended.  Without guarding or rebound. Extremities: Without cyanosis, clubbing, edema, or obvious deformity, left hip bandage clean dry intact. Vascular:  Dorsalis pedis and posterior tibial pulses palpable bilaterally. Skin:  Warm and dry, no erythema, no ulcerations; left hip bandage clean dry intact.  Data Reviewed: I have personally reviewed following labs and imaging studies  CBC: Recent Labs  Lab 07/31/20 1420 07/31/20 1659 08/02/20 0952  WBC 12.1* 12.6* 10.5  HGB 12.7* 12.3* 11.1*  HCT  40.1 39.1 35.6*  MCV 98.8 99.0 100.8*  PLT 305 286 914   Basic Metabolic Panel: Recent Labs  Lab 07/31/20 1420 07/31/20 1659 08/02/20 0952  NA 137  --  130*  K 4.7  --  5.6*  CL 99  --  96*  CO2 30  --  21*  GLUCOSE 150*  --  510*  BUN 46*  --  60*  CREATININE 1.94* 1.99* 2.55*  CALCIUM 8.4*  --  7.7*   GFR: Estimated Creatinine Clearance: 22.8 mL/min (A) (by C-G formula based on SCr of 2.55 mg/dL (H)). Liver Function Tests: Recent Labs  Lab 07/31/20 1420  AST 14*  ALT 11  ALKPHOS 80  BILITOT 0.5  PROT 7.4  ALBUMIN 3.3*   No results for input(s): LIPASE, AMYLASE in the last 168 hours. No results for input(s): AMMONIA in the last 168 hours. Coagulation Profile: No results for input(s): INR, PROTIME in the last 168 hours. Cardiac Enzymes: No results for input(s): CKTOTAL, CKMB, CKMBINDEX, TROPONINI in the last 168 hours. BNP (last 3 results) No results for input(s): PROBNP in the last 8760 hours. HbA1C: No results  for input(s): HGBA1C in the last 72 hours. CBG: Recent Labs  Lab 08/01/20 0628 08/01/20 1226 08/01/20 2106 08/02/20 0617 08/02/20 1233  GLUCAP 97 81 180* 337* 477*   Lipid Profile: No results for input(s): CHOL, HDL, LDLCALC, TRIG, CHOLHDL, LDLDIRECT in the last 72 hours. Thyroid Function Tests: No results for input(s): TSH, T4TOTAL, FREET4, T3FREE, THYROIDAB in the last 72 hours. Anemia Panel: No results for input(s): VITAMINB12, FOLATE, FERRITIN, TIBC, IRON, RETICCTPCT in the last 72 hours. Sepsis Labs: No results for input(s): PROCALCITON, LATICACIDVEN in the last 168 hours.  Recent Results (from the past 240 hour(s))  SARS Coronavirus 2 by RT PCR (hospital order, performed in [facility] hospital lab) Nasopharyngeal Nasopharyngeal Swab     Status: None   Collection Time: 07/31/20  2:20 PM   Specimen: Nasopharyngeal Swab  Result Value Ref Range Status   SARS Coronavirus 2 NEGATIVE NEGATIVE Final    Comment: (NOTE) SARS-CoV-2 target  nucleic acids are NOT DETECTED.  The SARS-CoV-2 RNA is generally detectable in upper and lower respiratory specimens during the acute phase of infection. The lowest concentration of SARS-CoV-2 viral copies this assay can detect is 250 copies / mL. A negative result does not preclude SARS-CoV-2 infection and should not be used as the sole basis for treatment or other patient management decisions.  A negative result may occur with improper specimen collection / handling, submission of specimen other than nasopharyngeal swab, presence of viral mutation(s) within the areas targeted by this assay, and inadequate number of viral copies (<250 copies / mL). A negative result must be combined with clinical observations, patient history, and epidemiological information.  Fact Sheet for Patients:   StrictlyIdeas.no  Fact Sheet for Healthcare Providers: BankingDealers.co.za  This test is not yet approved or  cleared by the [place] FDA and has been authorized for detection and/or diagnosis of SARS-CoV-2 by FDA under an Emergency Use Authorization (EUA).  This EUA will remain in effect (meaning this test can be used) for the duration of the COVID-19 declaration under Section 564(b)(1) of the Act, 21 U.S.C. section 360bbb-3(b)(1), unless the authorization is terminated or revoked sooner.  Performed at [facility], [place] [address] [place], [place] [address]   Surgical PCR screen     Status: None   Collection Time: 07/31/20  4:05 PM   Specimen: Nasal Mucosa; Nasal Swab  Result Value Ref Range Status   MRSA, PCR NEGATIVE NEGATIVE Final   Staphylococcus aureus NEGATIVE NEGATIVE Final    Comment: (NOTE) The Xpert SA Assay (FDA approved for NASAL specimens in patients 11 years of age and older), is one component of a comprehensive surveillance program. It is not intended to diagnose infection nor to guide or monitor  treatment. Performed at [facility], [place] [address] [place], [place] [address]   Surgical pcr screen     Status: None   Collection Time: 07/31/20 10:18 PM   Specimen: Nasal Mucosa; Nasal Swab  Result Value Ref Range Status   MRSA, PCR NEGATIVE NEGATIVE Final   Staphylococcus aureus NEGATIVE NEGATIVE Final    Comment: (NOTE) The Xpert SA Assay (FDA approved for NASAL specimens in patients 22 years of age and older), is one component of a comprehensive surveillance program. It is not intended to diagnose infection nor to guide or monitor treatment. Performed at [facility], [place] [address] [place], [place] [address]          Radiology Studies: DG C-Arm 1-60 Min  Result Date: 08/01/2020 CLINICAL DATA:  Retrograde IM nail of left femur. EXAM: DG C-ARM 1-60 MIN; LEFT FEMUR 2 VIEWS FLUOROSCOPY TIME:  Fluoroscopy Time:  1 minutes 27 seconds Radiation Exposure Index (if provided by the fluoroscopic device): 8.80 mGy Number of Acquired Spot Images: 5 COMPARISON:  Preoperative radiograph yesterday FINDINGS: Five fluoroscopic spot images obtained in the operating room in frontal and lateral projections. Intramedullary nail with distal and proximal locking screws traverse periprosthetic femoral shaft fracture. Prior lateral plate and screw fixation and hip arthroplasty partially included. IMPRESSION: Fluoroscopic spot images during intramedullary nail placement for periprosthetic femoral shaft fracture. Electronically Signed   By: Keith Rake M.D.   On: 08/01/2020 18:14   DG FEMUR MIN 2 VIEWS LEFT  Result Date: 08/01/2020 CLINICAL DATA:  Retrograde IM nail of left femur. EXAM: DG C-ARM 1-60 MIN; LEFT FEMUR 2 VIEWS FLUOROSCOPY TIME:  Fluoroscopy Time:  1 minutes 27 seconds Radiation Exposure Index (if provided by the fluoroscopic device): 8.80 mGy Number of Acquired Spot Images: 5 COMPARISON:  Preoperative radiograph yesterday FINDINGS: Five fluoroscopic spot images  obtained in the operating room in frontal and lateral projections. Intramedullary nail with distal and proximal locking screws traverse periprosthetic femoral shaft fracture. Prior lateral plate and screw fixation and hip arthroplasty partially included. IMPRESSION: Fluoroscopic spot images during intramedullary nail placement for periprosthetic femoral shaft fracture. Electronically Signed   By: Keith Rake M.D.   On: 08/01/2020 18:14   DG FEMUR PORT MIN 2 VIEWS LEFT  Result Date: 08/01/2020 CLINICAL DATA:  Postop. EXAM: LEFT FEMUR PORTABLE 2 VIEWS COMPARISON:  08/01/2020 FINDINGS: The patient has undergone distal intramedullary nail placement through the femur. Again noted is a fracture through the distal femur. The fracture remains displaced. The patient has undergone total hip arthroplasty on the left. The hardware appears grossly intact. The patient is status post prior plate and screw fixation of the proximal femoral diaphysis. The hardware appears relatively similar to prior study although the inferior-most screws have been transected or removed. There are expected postsurgical changes including subcutaneous gas and overlying soft tissue edema. IMPRESSION: Status post ORIF as detailed above. Electronically Signed   By: Constance Holster M.D.   On: 08/01/2020 18:32   Scheduled Meds: . Apremilast  30 mg Oral Daily  . atorvastatin  10 mg Oral Daily  . calcium carbonate  1 tablet Oral Q breakfast  . doxycycline  100 mg Oral Q12H  . enoxaparin (LOVENOX) injection  30 mg Subcutaneous Q24H  . escitalopram  5 mg Oral Daily  . feeding supplement (GLUCERNA SHAKE)  237 mL Oral BID BM  . fluticasone furoate-vilanterol  1 puff Inhalation Daily  . insulin aspart  0-15 Units Subcutaneous TID WC  . insulin aspart  0-5 Units Subcutaneous QHS  . insulin glargine  5 Units Subcutaneous BID  . multivitamin with minerals  1 tablet Oral Daily  . mupirocin ointment  1 application Nasal BID  . Ensure Max  Protein  11 oz Oral QHS  . sodium zirconium cyclosilicate  5 g Oral Once  . tamsulosin  0.8 mg Oral QPC supper  . umeclidinium bromide  1 puff Inhalation Daily   Continuous Infusions: . lactated ringers       LOS: 2 days   Time spent: 39min  Alison Kubicki C Gradie Ohm, DO [facility]  If 7PM-7AM, please contact night-coverage [email or web address]  08/02/2020, 2:10 PM

## 2020-08-02 NOTE — Progress Notes (Signed)
Orthopaedic Trauma Service Progress Note  Patient ID: [name] Small MRN: [id number] DOB/AGE: 1928-08-18 86 y.o.  Subjective:  Doing ok  L leg sore but ok  No acute complaints  Has been in bedside chair about 2 hours today   Condom cath in place   ROS As above Objective:   VITALS:   Vitals:   08/01/20 2050 08/02/20 0029 08/02/20 0407 08/02/20 0748  BP: (!) 117/56 (!) 117/50 (!) 110/52   Pulse: 91 94 94 86  Resp: 18 18 18 16   Temp: 97.6 F (36.4 C) 97.8 F (36.6 C) 98.2 F (36.8 C)   TempSrc: Oral Oral Oral   SpO2: 95% 96% 97% 95%  Weight:      Height:        Estimated body mass index is 26.96 kg/m as calculated from the following:   Height as of this encounter: 6\' 2"  (1.88 m).   Weight as of this encounter: 95.3 kg.   Intake/Output      09/21 0701 - 09/22 0700 09/22 0701 - 09/23 0700   P.O. 0 240   I.V. (mL/kg) 1300 (13.6)    IV Piggyback 250    Total Intake(mL/kg) 1550 (16.3) 240 (2.5)   Urine (mL/kg/hr) 400 (0.2) 300 (0.9)   Total Output 400 300   Net +1150 -60          LABS  Results for orders placed or performed during the hospital encounter of 07/31/20 (from the past 24 hour(s))  Glucose, capillary     Status: None   Collection Time: 08/01/20 12:26 PM  Result Value Ref Range   Glucose-Capillary 81 70 - 99 mg/dL  Glucose, capillary     Status: Abnormal   Collection Time: 08/01/20  9:06 PM  Result Value Ref Range   Glucose-Capillary 180 (H) 70 - 99 mg/dL  Glucose, capillary     Status: Abnormal   Collection Time: 08/02/20  6:17 AM  Result Value Ref Range   Glucose-Capillary 337 (H) 70 - 99 mg/dL     PHYSICAL EXAM:    Gen: sitting up in chair, pleasant Lungs: unlabored  Ext:       Left Lower extremity   Dressings L thigh with some strikethrough but are stable  Skin with bronzy hue to it   Ext warm   Swelling minimal   S/p transmet amp. Incision well healed  (2017)  Motor and sensory functions at baseline   Faint DP pulse but symmetric     Assessment/Plan: 1 Day Post-Op   Principal Problem:   Femur fracture, left (HCC) Active Problems:   Type 2 diabetes mellitus with diabetic polyneuropathy (HCC)   BPH (benign prostatic hyperplasia)   Acute kidney injury ([place])   Periprosthetic hip fracture   Recurrent UTI   Pressure injury of skin   Anti-infectives (From admission, onward)   Start     Dose/Rate Route Frequency Ordered Stop   08/02/20 1000  doxycycline (VIBRA-TABS) tablet 100 mg        100 mg Oral Every 12 hours 08/02/20 0754 08/12/20 0959   08/01/20 1600  ceFAZolin (ANCEF) IVPB 2g/100 mL premix        2 g 200 mL/hr over 30 Minutes Intravenous On call to O.R. 07/31/20 2038 08/01/20 1450   08/01/20 1200  cephALEXin (KEFLEX) capsule  500 mg  Status:  Discontinued        500 mg Oral Every 12 hours 08/01/20 1152 08/02/20 0754   07/31/20 2200  cephALEXin (KEFLEX) capsule 500 mg  Status:  Discontinued        500 mg Oral 2 times daily 07/31/20 1658 07/31/20 1703   07/31/20 1500  cephALEXin (KEFLEX) capsule 500 mg        500 mg Oral  Once 07/31/20 1454 07/31/20 1532    .  POD/HD#: 1  86 y/o male s/p fall with L peri-implant distal femur fracture   - Fall   - L peri-implant distal femur fracture s/p IMN   TDWB L leg  ROM as tolerated L knee  Posterior hip precautions L hip (s/p THA)  Therapy evals  Dressing changes starting tomorrow   Ice PRN    Aggressive sugar control to minimize risk of infection   Uses walker at baseline   - Pain management:  Minimize narcs  Multimodal   - ABL anemia/Hemodynamics  Stable  - Medical issues   Per primary   - DVT/PE prophylaxis:  Recommend lovenox x 21 days  - ID:   periop abx  - Metabolic Bone Disease:  Diabetes   CKD     Vitamin d levels pending     Pt clearly has poor bone quality    S/p ORIF L periprosthetic proximal femur fracture 04/2020   Meets criteria for  fracture liaison consult     - Activity:  Up with assistance  TDWB L leg   - FEN/GI prophylaxis/Foley/Lines:  Carb mod   - Impediments to fracture healing:  Poor bone quality   Renal disease   Diabetes  - Dispo:  Therapy evals  Will likely need snf     Jari Pigg, PA-C [phone] Loletha Grayer) 08/02/2020, 10:22 AM  [facility] [place] [address] [phone] (F)

## 2020-08-03 ENCOUNTER — Encounter: Payer: Self-pay | Admitting: Family Medicine

## 2020-08-03 LAB — GLUCOSE, CAPILLARY
Glucose-Capillary: 189 mg/dL — ABNORMAL HIGH (ref 70–99)
Glucose-Capillary: 204 mg/dL — ABNORMAL HIGH (ref 70–99)
Glucose-Capillary: 267 mg/dL — ABNORMAL HIGH (ref 70–99)
Glucose-Capillary: 221 mg/dL — ABNORMAL HIGH (ref 70–99)

## 2020-08-03 MED ORDER — NALOXONE HCL 0.4 MG/ML IJ SOLN
INTRAMUSCULAR | Status: AC
  Filled 2020-08-03: qty 1

## 2020-08-03 MED ORDER — LACTATED RINGERS IV SOLN: Freq: Once | INTRAVENOUS | Status: AC

## 2020-08-03 NOTE — Progress Notes (Signed)
Orthopaedic Trauma Service Progress Note  Patient ID: [name] Small MRN: [id number] DOB/AGE: December 21, 1927 86 y.o.  Subjective:  No acute issues Sat in chair about 3 hours today  Denies pain in L leg    ROS As above  Objective:   VITALS:   Vitals:   08/03/20 0437 08/03/20 0740 08/03/20 1302 08/03/20 1519  BP: (!) 106/54 (!) 127/59 (!) 127/59 (!) 121/52  Pulse: 65 66  72  Resp: 18 17  18   Temp: (!) 97.4 F (36.3 C) 97.7 F (36.5 C)  97.7 F (36.5 C)  TempSrc: Oral Oral  Oral  SpO2: 97% 97% 97% 97%  Weight:      Height:        Estimated body mass index is 26.96 kg/m as calculated from the following:   Height as of this encounter: 6\' 2"  (1.88 m).   Weight as of this encounter: 95.3 kg.   Intake/Output      09/22 0701 - 09/23 0700 09/23 0701 - 09/24 0700   P.O. 600 480   I.V. (mL/kg) 0 (0)    IV Piggyback 280.6    Total Intake(mL/kg) 880.6 (9.2) 480 (5)   Urine (mL/kg/hr) 1650 (0.7) 350 (0.3)   Stool 0    Total Output 1650 350   Net -769.4 +130        Stool Occurrence 4 x      LABS  Results for orders placed or performed during the hospital encounter of 07/31/20 (from the past 24 hour(s))  Glucose, capillary     Status: Abnormal   Collection Time: 08/03/20  7:00 AM  Result Value Ref Range   Glucose-Capillary 189 (H) 70 - 99 mg/dL  Glucose, capillary     Status: Abnormal   Collection Time: 08/03/20 11:37 AM  Result Value Ref Range   Glucose-Capillary 204 (H) 70 - 99 mg/dL  Glucose, capillary     Status: Abnormal   Collection Time: 08/03/20  4:39 PM  Result Value Ref Range   Glucose-Capillary 267 (H) 70 - 99 mg/dL     PHYSICAL EXAM:   Gen: in bed, pleasant, NAD, appears well  Lungs: unlabored  Ext:       Left Lower extremity              Dressings L thigh with some strikethrough but are stable              Ext warm              Swelling minimal              S/p transmet  amp. Incision well healed (2017)             Motor and sensory functions at baseline              Faint DP pulse but symmetric    Assessment/Plan: 2 Days Post-Op   Principal Problem:   Femur fracture, left (HCC) Active Problems:   Type 2 diabetes mellitus with diabetic polyneuropathy (HCC)   BPH (benign prostatic hyperplasia)   Acute kidney injury ([place])   Periprosthetic hip fracture   Recurrent UTI   Pressure injury of skin   Anti-infectives (From admission, onward)   Start     Dose/Rate Route Frequency Ordered Stop   08/02/20 1000  doxycycline (VIBRA-TABS) tablet 100 mg        100 mg Oral Every 12 hours 08/02/20 0754 08/12/20 0959   08/01/20 1600  ceFAZolin (ANCEF) IVPB 2g/100 mL premix        2 g 200 mL/hr over 30 Minutes Intravenous On call to O.R. 07/31/20 2038 08/01/20 1450   08/01/20 1200  cephALEXin (KEFLEX) capsule 500 mg  Status:  Discontinued        500 mg Oral Every 12 hours 08/01/20 1152 08/02/20 0754   07/31/20 2200  cephALEXin (KEFLEX) capsule 500 mg  Status:  Discontinued        500 mg Oral 2 times daily 07/31/20 1658 07/31/20 1703   07/31/20 1500  cephALEXin (KEFLEX) capsule 500 mg        500 mg Oral  Once 07/31/20 1454 07/31/20 1532    .  POD/HD#: 2  86 y/o male s/p fall with L peri-implant distal femur fracture    - Fall    - L peri-implant distal femur fracture s/p IMN              TDWB L leg             ROM as tolerated L knee             Posterior hip precautions L hip (s/p THA)             Therapy evals             Dressing changes starting tomorrow              Ice PRN                Aggressive sugar control to minimize risk of infection               Uses walker at baseline    - Pain management:             Minimize narcs             Multimodal    - ABL anemia/Hemodynamics             Stable   - Medical issues              Per primary    - DVT/PE prophylaxis:             Recommend lovenox x 21 days   - ID:              periop  abx   - Metabolic Bone Disease:             Diabetes              CKD                           Vitamin d levels pending                           Pt clearly has poor bone quality                          S/p ORIF L periprosthetic proximal femur fracture 04/2020                         Meets criteria for fracture liaison consult                 -  Activity:             Up with assistance             TDWB L leg    - FEN/GI prophylaxis/Foley/Lines:             Carb mod    - Impediments to fracture healing:             Poor bone quality              Renal disease              Diabetes   - Dispo:             Therapy evals             Will likely need snf       Jari Pigg, PA-C [phone] Loletha Grayer) 08/03/2020, 6:27 PM  [facility] [place] [address] [phone] (F)

## 2020-08-03 NOTE — Plan of Care (Signed)

## 2020-08-03 NOTE — Consult Note (Signed)
   [facility] Inpatient Consult   08/03/2020  [name] Small Nov 20, 1927 [id number]   [place] [facility] [ACO] Patient:  [facility]  Chart review reveals patient is being recommended for a skilled nursing facility transition of care for transition.  Plan:  Follow for disposition  Natividad Brood, RN BSN [facility] Liaison  [phone] business mobile phone Toll free office [phone]  Fax number: [phone] [email or web address]

## 2020-08-03 NOTE — Progress Notes (Signed)
Physical Therapy Treatment Patient Details Name: [name] Small MRN: [id number] DOB: 06-19-1928 Today's Date: 08/03/2020    History of Present Illness Pt is an 86 y/o male who sustained twisting injury with fall resulting in left leg pain and inability to bear weight. Urinary tract infection with multidrug resistant E Coli. X-rays demonstrate fracture below the plate which was used to fix his previous fracture below a hemiarthroplasty. Pt underwent surgical repair with TDWB and posterior hip precautions    PT Comments    Pt supine on arrival, agreeable to therapy session with good participation and tolerance for session. Pt performed bed mobility with minA and increased time, sit<>stand from bed height (x2 reps) with +1-81modA and partial stand from chair with +26maxA (able to lift hips from chair but unable to reach full upright stance 2/2 LLE pain), deferred further tx and pt remained in chair. Pt making good progress towards goals, able to recall 1/3 posterior hip precautions and TDWB restriction and will likely need continued reinforcement for precaution compliance. Pt continues to benefit from PT services to progress toward functional mobility goals. Continue to recommend SNF level of rehab.   Follow Up Recommendations  SNF     Equipment Recommendations  None recommended by PT    Recommendations for Other Services       Precautions / Restrictions Precautions Precautions: Fall;Posterior Hip Restrictions Weight Bearing Restrictions: Yes LLE Weight Bearing: Touchdown weight bearing    Mobility  Bed Mobility Overal bed mobility: Needs Assistance Bed Mobility: Supine to Sit     Supine to sit: Min assist;+2 for physical assistance        Transfers Overall transfer level: Needs assistance Equipment used: Rolling walker (2 wheeled) Transfers: Sit to/from [facility] Sit to Stand: +2 physical assistance;Mod assist Stand pivot transfers: +2 physical  assistance;Max assist;+2 safety/equipment       General transfer comment: +69maxA for pivoting from bed>chair, cues and physical assist needed to maintain LLE advanced to prevent WB and for RW proximity to body; STS x3 total reps, unable to stand from chair today without +2 assist  Ambulation/Gait                 Stairs             Wheelchair Mobility    Modified Rankin (Stroke Patients Only)       Balance Overall balance assessment: Needs assistance;History of Falls Sitting-balance support: Single extremity supported;Feet supported Sitting balance-Leahy Scale: Fair     Standing balance support: Bilateral upper extremity supported;During functional activity Standing balance-Leahy Scale: Poor Standing balance comment: reliance on UE support with RW                            Cognition Arousal/Alertness: Awake/alert Behavior During Therapy: WFL for tasks assessed/performed Overall Cognitive Status: Within Functional Limits for tasks assessed Area of Impairment: Memory                     Memory: Decreased short-term memory;Decreased recall of precautions         General Comments: A&Ox4. Some difficulties recalling posterior hip precautions, following safety commands but overall functional      Exercises      General Comments        Pertinent Vitals/Pain Pain Assessment: 0-10 Pain Score: 4  Pain Location: LLE with mobility, none at rest Pain Descriptors / Indicators: Grimacing;Operative site guarding Pain Intervention(s): Limited activity within  patient's tolerance;Monitored during session;RN gave pain meds during session;Ice applied   Vitals:   08/03/20 0740 08/03/20 1302  BP: (!) 127/59 (!) 127/59  Pulse: 66   Resp: 17   Temp: 97.7 F (36.5 C)   SpO2: 97% 97%   Home Living                      Prior Function            PT Goals (current goals can now be found in the care plan section) Acute Rehab PT  Goals Patient Stated Goal: would like to go to rehab to increase strength/independence PT Goal Formulation: With patient/family Time For Goal Achievement: 08/16/20 Potential to Achieve Goals: Good Progress towards PT goals: Progressing toward goals    Frequency    Min 3X/week      PT Plan Current plan remains appropriate    Co-evaluation              AM-PAC PT "6 Clicks" Mobility   Outcome Measure  Help needed turning from your back to your side while in a flat bed without using bedrails?: A Little Help needed moving from lying on your back to sitting on the side of a flat bed without using bedrails?: A Little Help needed moving to and from a bed to a chair (including a wheelchair)?: A Lot Help needed standing up from a chair using your arms (e.g., wheelchair or bedside chair)?: A Lot Help needed to walk in hospital room?: Total Help needed climbing 3-5 steps with a railing? : Total 6 Click Score: 12    End of Session Equipment Utilized During Treatment: Gait belt Activity Tolerance: Patient limited by pain Patient left: with call bell/phone within reach;in chair;with chair alarm set;with family/visitor present Nurse Communication: Mobility status;Weight bearing status PT Visit Diagnosis: Pain;Difficulty in walking, not elsewhere classified (R26.2);Unsteadiness on feet (R26.81);Other abnormalities of gait and mobility (R26.89);History of falling (Z91.81) Pain - Right/Left: Left Pain - part of body: Hip;Knee     Time: 4818-5631 PT Time Calculation (min) (ACUTE ONLY): 29 min  Charges:  $Therapeutic Exercise: 8-22 mins $Therapeutic Activity: 8-22 mins                     Mansur Patti P., PTA Acute Rehabilitation Services Pager: [phone] Office: [place] 08/03/2020, 1:25 PM

## 2020-08-03 NOTE — Progress Notes (Signed)
PROGRESS NOTE    [name] Small  WCB:[id number] DOB: 1928/03/23 DOA: 07/31/2020 PCP: Tonia Ghent, MD   Brief Narrative:  This is an 86 year old male with past medical history of HFpEF, CAD, CKD 3, COPD, recurrent UTIs, left femur fracture repair in June, diabetes who presented to the ED with a chief complaint of a fall and left leg pain.  He had been feeling unwell lately with increased urinary urgency over the weekend.  He had a fall on Saturday with subsequent left hip pain but did not come straight to the ED.  Today his occupational therapist was evaluating him and recommended that he come to the ED.  He has had significant pain in his left leg since.  No loss of consciousness, no head trauma.  He is not on any blood thinners.  No other pain. Of note he was found to have a multidrug-resistant E. coli UTI on 9/1 and was prescribed Keflex and found to have recurrent UTI recently at the urology office and was supposed to start Keflex today.  He does admit to urinary urgency and occasionally urinary retention. In ED: afebrile hemodynamically stable on room air.  Notable labs: Glucose 150, creatinine 1.94 (baseline 1.25 in July), WBC 12.1.  Left femur x-ray: New mildly angulated oblique fracture involving the distal left femoral shaft around the 2 most distal screws of the previously placed internal fixation plate.  ED physician discussed with Dr. [name], orthopedics, who recommended admission/transfer to [facility].  Patient met as above with mechanical fall with left leg pain in the same region as previous fracture.  Patient evaluated by orthopedic surgery, now status post ORIF tolerated procedure well working with physical therapy but ultimately will need disposition to skilled nursing facility for ongoing PT and close monitoring with elevated fall risk.  Patient clinically and medically stable for discharge at this time, awaiting bed placement and approval with insurance at this  time.  Assessment & Plan:   Principal Problem:   Femur fracture, left (HCC) Active Problems:   Type 2 diabetes mellitus with diabetic polyneuropathy (HCC)   BPH (benign prostatic hyperplasia)   Acute kidney injury ([place])   Periprosthetic hip fracture   Recurrent UTI   Pressure injury of skin   Left femur fracture s/p mechanical fall with history of recent left femur fracture s/p ORIF by Dr. [name] previously on 04/19/2020 Orthopedic surgery consulted Status post ORIF 9/21; tolerated procedure quite well Pain management/DVT ppx per ortho PT/OT to follow -currently recommending SNF  Recurrent UTI with history of multidrug-resistant E. coli UTI Urology recommending doxycycline for 10 days given cultures, every 4 hour bladder scans with in/out cath if greater than 400 cc urine noted  Suspected AKI vs worsening CKD 3b Creatinine 1.9 and was 1.25 in July has been steadily increasing Unclear if worsening baseline function vs more acute process Worsening despite LR bolus on the 22nd, continue LR at 50 cc/h x1L Encourage increased p.o. intake Lab Results  Component Value Date   CREATININE 2.55 (H) 08/02/2020   CREATININE 1.99 (H) 07/31/2020   CREATININE 1.94 (H) 07/31/2020   Profound electrolyte abnormalities in the setting of fluid restriction - Patient has moderate hyponatremia, hyperkalemia, hypochloremia and hyperglycemia with worsening creatinine as above -Continue IV fluids, increase p.o. intake, Lokelma x1, insulin as below  Intermittent urinary retention with history of enlarged prostate  Follows with urology Straight caths at home Continue tamsulosin Every 4 hour bladder scans with in/out cath if greater than 400 cc  urine noted  Generalized weakness, likely multifactorial: UTI, debility PT following appreciate insight and recommendations, likely requiring SNF  COPD, not in exacerbation Continue home Spiriva with Breo  HFpEF, not in exacerbation Daily weights and  intake/output  Uncontrolled insulin-dependent diabetes mellitus type 2  Increase Lantus to 5 units twice daily, monitor for hypoglycemia Initiate sliding scale insulin - follow for hypoglycemia Lab Results  Component Value Date   HGBA1C 8.6 (H) 07/20/2020   Stage II, sacral decubitus ulcer, POA -Continue bedside wound care  DVT prophylaxis: Heparin Code Status: DNR Family Communication: Wife at bedside  Status is: Inpatient  Dispo: The patient is from: Home              Anticipated d/c is to: SNF              Anticipated d/c date is: 48 to 72 hours pending clinical course              Patient currently not medically stable for discharge due to ongoing need for further surgical evaluation in the setting of fracture and mechanical fall.  Consultants:   Ortho  Procedures:   ORIF 08/01/2020 per orthopedics  Antimicrobials:  Perioperatively per orthopedics  Subjective: No acute issues or events overnight, patient remarks on postoperative pain but currently well controlled, otherwise denies nausea, vomiting, diarrhea, constipation, headache, fevers, chills.  Objective: Vitals:   08/02/20 1300 08/02/20 2109 08/03/20 0437 08/03/20 0740  BP: 110/62 119/67 (!) 106/54 (!) 127/59  Pulse: 77 76 65 66  Resp: 16 18 18 17   Temp: 98.1 F (36.7 C) 97.8 F (36.6 C) (!) 97.4 F (36.3 C) 97.7 F (36.5 C)  TempSrc: Oral Oral Oral Oral  SpO2: 99% 98% 97% 98%  Weight:      Height:        Intake/Output Summary (Last 24 hours) at 08/03/2020 0757 Last data filed at 08/03/2020 0600 Gross per 24 hour  Intake 880.6 ml  Output 1650 ml  Net -769.4 ml   Filed Weights   07/31/20 1030 08/01/20 1330  Weight: 95.3 kg 95.3 kg    Examination:  General:  Pleasantly resting in bed, No acute distress. HEENT:  Normocephalic atraumatic.  Sclerae nonicteric, noninjected.  Extraocular movements intact bilaterally. Neck:  Without mass or deformity.  Trachea is midline. Lungs:  Clear to  auscultate bilaterally without rhonchi, wheeze, or rales. Heart:  Regular rate and rhythm.  Without murmurs, rubs, or gallops. Abdomen:  Soft, nontender, nondistended.  Without guarding or rebound. Extremities: Without cyanosis, clubbing, edema, or obvious deformity, left hip bandage clean dry intact. Vascular:  Dorsalis pedis and posterior tibial pulses palpable bilaterally. Skin:  Warm and dry, no erythema, no ulcerations; left hip bandage clean dry intact.  Data Reviewed: I have personally reviewed following labs and imaging studies  CBC: Recent Labs  Lab 07/31/20 1420 07/31/20 1659 08/02/20 0952  WBC 12.1* 12.6* 10.5  HGB 12.7* 12.3* 11.1*  HCT 40.1 39.1 35.6*  MCV 98.8 99.0 100.8*  PLT 305 286 030   Basic Metabolic Panel: Recent Labs  Lab 07/31/20 1420 07/31/20 1659 08/02/20 0952  NA 137  --  130*  K 4.7  --  5.6*  CL 99  --  96*  CO2 30  --  21*  GLUCOSE 150*  --  510*  BUN 46*  --  60*  CREATININE 1.94* 1.99* 2.55*  CALCIUM 8.4*  --  7.7*   GFR: Estimated Creatinine Clearance: 22.8 mL/min (A) (by C-G formula based  on SCr of 2.55 mg/dL (H)). Liver Function Tests: Recent Labs  Lab 07/31/20 1420  AST 14*  ALT 11  ALKPHOS 80  BILITOT 0.5  PROT 7.4  ALBUMIN 3.3*   No results for input(s): LIPASE, AMYLASE in the last 168 hours. No results for input(s): AMMONIA in the last 168 hours. Coagulation Profile: No results for input(s): INR, PROTIME in the last 168 hours. Cardiac Enzymes: No results for input(s): CKTOTAL, CKMB, CKMBINDEX, TROPONINI in the last 168 hours. BNP (last 3 results) No results for input(s): PROBNP in the last 8760 hours. HbA1C: No results for input(s): HGBA1C in the last 72 hours. CBG: Recent Labs  Lab 08/02/20 1233 08/02/20 1617 08/02/20 1719 08/02/20 1822 08/03/20 0700  GLUCAP 477* 54* 44* 89 189*   Lipid Profile: No results for input(s): CHOL, HDL, LDLCALC, TRIG, CHOLHDL, LDLDIRECT in the last 72 hours. Thyroid Function  Tests: No results for input(s): TSH, T4TOTAL, FREET4, T3FREE, THYROIDAB in the last 72 hours. Anemia Panel: No results for input(s): VITAMINB12, FOLATE, FERRITIN, TIBC, IRON, RETICCTPCT in the last 72 hours. Sepsis Labs: No results for input(s): PROCALCITON, LATICACIDVEN in the last 168 hours.  Recent Results (from the past 240 hour(s))  SARS Coronavirus 2 by RT PCR (hospital order, performed in [facility] hospital lab) Nasopharyngeal Nasopharyngeal Swab     Status: None   Collection Time: 07/31/20  2:20 PM   Specimen: Nasopharyngeal Swab  Result Value Ref Range Status   SARS Coronavirus 2 NEGATIVE NEGATIVE Final    Comment: (NOTE) SARS-CoV-2 target nucleic acids are NOT DETECTED.  The SARS-CoV-2 RNA is generally detectable in upper and lower respiratory specimens during the acute phase of infection. The lowest concentration of SARS-CoV-2 viral copies this assay can detect is 250 copies / mL. A negative result does not preclude SARS-CoV-2 infection and should not be used as the sole basis for treatment or other patient management decisions.  A negative result may occur with improper specimen collection / handling, submission of specimen other than nasopharyngeal swab, presence of viral mutation(s) within the areas targeted by this assay, and inadequate number of viral copies (<250 copies / mL). A negative result must be combined with clinical observations, patient history, and epidemiological information.  Fact Sheet for Patients:   StrictlyIdeas.no  Fact Sheet for Healthcare Providers: BankingDealers.co.za  This test is not yet approved or  cleared by the [place] FDA and has been authorized for detection and/or diagnosis of SARS-CoV-2 by FDA under an Emergency Use Authorization (EUA).  This EUA will remain in effect (meaning this test can be used) for the duration of the COVID-19 declaration under Section 564(b)(1) of the  Act, 21 U.S.C. section 360bbb-3(b)(1), unless the authorization is terminated or revoked sooner.  Performed at [facility], [place] [address], [place], [place] [address]   Surgical PCR screen     Status: None   Collection Time: 07/31/20  4:05 PM   Specimen: Nasal Mucosa; Nasal Swab  Result Value Ref Range Status   MRSA, PCR NEGATIVE NEGATIVE Final   Staphylococcus aureus NEGATIVE NEGATIVE Final    Comment: (NOTE) The Xpert SA Assay (FDA approved for NASAL specimens in patients 54 years of age and older), is one component of a comprehensive surveillance program. It is not intended to diagnose infection nor to guide or monitor treatment. Performed at [facility], [place] [address], [place], [place] [address]   Surgical pcr screen     Status: None   Collection Time:  07/31/20 10:18 PM   Specimen: Nasal Mucosa; Nasal Swab  Result Value Ref Range Status   MRSA, PCR NEGATIVE NEGATIVE Final   Staphylococcus aureus NEGATIVE NEGATIVE Final    Comment: (NOTE) The Xpert SA Assay (FDA approved for NASAL specimens in patients 60 years of age and older), is one component of a comprehensive surveillance program. It is not intended to diagnose infection nor to guide or monitor treatment. Performed at [facility], [place] [address], [place], [place] [address]          Radiology Studies: DG C-Arm 1-60 Min  Result Date: 08/01/2020 CLINICAL DATA:  Retrograde IM nail of left femur. EXAM: DG C-ARM 1-60 MIN; LEFT FEMUR 2 VIEWS FLUOROSCOPY TIME:  Fluoroscopy Time:  1 minutes 27 seconds Radiation Exposure Index (if provided by the fluoroscopic device): 8.80 mGy Number of Acquired Spot Images: 5 COMPARISON:  Preoperative radiograph yesterday FINDINGS: Five fluoroscopic spot images obtained in the operating room in frontal and lateral projections. Intramedullary nail with distal and proximal locking screws traverse periprosthetic femoral shaft fracture. Prior  lateral plate and screw fixation and hip arthroplasty partially included. IMPRESSION: Fluoroscopic spot images during intramedullary nail placement for periprosthetic femoral shaft fracture. Electronically Signed   By: Keith Rake M.D.   On: 08/01/2020 18:14   DG FEMUR MIN 2 VIEWS LEFT  Result Date: 08/01/2020 CLINICAL DATA:  Retrograde IM nail of left femur. EXAM: DG C-ARM 1-60 MIN; LEFT FEMUR 2 VIEWS FLUOROSCOPY TIME:  Fluoroscopy Time:  1 minutes 27 seconds Radiation Exposure Index (if provided by the fluoroscopic device): 8.80 mGy Number of Acquired Spot Images: 5 COMPARISON:  Preoperative radiograph yesterday FINDINGS: Five fluoroscopic spot images obtained in the operating room in frontal and lateral projections. Intramedullary nail with distal and proximal locking screws traverse periprosthetic femoral shaft fracture. Prior lateral plate and screw fixation and hip arthroplasty partially included. IMPRESSION: Fluoroscopic spot images during intramedullary nail placement for periprosthetic femoral shaft fracture. Electronically Signed   By: Keith Rake M.D.   On: 08/01/2020 18:14   DG FEMUR PORT MIN 2 VIEWS LEFT  Result Date: 08/01/2020 CLINICAL DATA:  Postop. EXAM: LEFT FEMUR PORTABLE 2 VIEWS COMPARISON:  08/01/2020 FINDINGS: The patient has undergone distal intramedullary nail placement through the femur. Again noted is a fracture through the distal femur. The fracture remains displaced. The patient has undergone total hip arthroplasty on the left. The hardware appears grossly intact. The patient is status post prior plate and screw fixation of the proximal femoral diaphysis. The hardware appears relatively similar to prior study although the inferior-most screws have been transected or removed. There are expected postsurgical changes including subcutaneous gas and overlying soft tissue edema. IMPRESSION: Status post ORIF as detailed above. Electronically Signed   By: Constance Holster M.D.    On: 08/01/2020 18:32   Scheduled Meds: . Apremilast  30 mg Oral Daily  . atorvastatin  10 mg Oral Daily  . calcium carbonate  1 tablet Oral Q breakfast  . doxycycline  100 mg Oral Q12H  . enoxaparin (LOVENOX) injection  30 mg Subcutaneous Q24H  . escitalopram  5 mg Oral Daily  . feeding supplement (GLUCERNA SHAKE)  237 mL Oral BID BM  . fluticasone furoate-vilanterol  1 puff Inhalation Daily  . insulin aspart  0-15 Units Subcutaneous TID WC  . insulin aspart  0-5 Units Subcutaneous QHS  . insulin glargine  5 Units Subcutaneous BID  . multivitamin with minerals  1 tablet Oral Daily  . mupirocin  ointment  1 application Nasal BID  . Ensure Max Protein  11 oz Oral QHS  . tamsulosin  0.8 mg Oral QPC supper  . umeclidinium bromide  1 puff Inhalation Daily   Continuous Infusions:    LOS: 3 days   Time spent: 83mn  Taijuan Serviss C Laurena Valko, DO [facility]  If 7PM-7AM, please contact night-coverage [email or web address]  08/03/2020, 7:57 AM

## 2020-08-03 NOTE — Progress Notes (Signed)
Inpatient Diabetes Program Recommendations  AACE/ADA: New Consensus Statement on Inpatient Glycemic Control (2015)  Target Ranges:  Prepandial:   less than 140 mg/dL      Peak postprandial:   less than 180 mg/dL (1-2 hours)      Critically ill patients:  140 - 180 mg/dL   Lab Results  Component Value Date   GLUCAP 189 (H) 08/03/2020   HGBA1C 8.6 (H) 07/20/2020    Review of Glycemic Control Results for [name] Small, [name] Small (MRN [id number]) as of 08/03/2020 11:04  Ref. Range 08/02/2020 16:17 08/02/2020 17:19 08/02/2020 18:22 08/03/2020 07:00  Glucose-Capillary Latest Ref Range: 70 - 99 mg/dL 54 (L) 44 (LL) 89 189 (H)   Diabetes history: Type 2 DM Outpatient Diabetes medications: Lantus 5 units BId Current orders for Inpatient glycemic control: Lantus 5 units BID, Novolog 0-15 units TID, Novolog 0-5 units QHS Novolog 10 units x 1 Inpatient Diabetes Program Recommendations:    Noted hypoglycemia related to correcting elevated glucose with short acting insulin following Decadron dose. FSBG 189 mg/dL, In agreement with current orders. Following.   Thanks, Bronson Curb, MSN, RNC-OB Diabetes Coordinator [phone] (8a-5p)

## 2020-08-04 LAB — CBC
HCT: 32.9 % — ABNORMAL LOW (ref 39.0–52.0)
Hemoglobin: 10.3 g/dL — ABNORMAL LOW (ref 13.0–17.0)
MCH: 30.6 pg (ref 26.0–34.0)
MCHC: 31.3 g/dL (ref 30.0–36.0)
MCV: 97.6 fL (ref 80.0–100.0)
Platelets: 277 10*3/uL (ref 150–400)
RBC: 3.37 MIL/uL — ABNORMAL LOW (ref 4.22–5.81)
RDW: 14.5 % (ref 11.5–15.5)
WBC: 10.2 10*3/uL (ref 4.0–10.5)
nRBC: 0 % (ref 0.0–0.2)

## 2020-08-04 LAB — BASIC METABOLIC PANEL
Anion gap: 9 (ref 5–15)
BUN: 48 mg/dL — ABNORMAL HIGH (ref 8–23)
CO2: 28 mmol/L (ref 22–32)
Calcium: 8 mg/dL — ABNORMAL LOW (ref 8.9–10.3)
Chloride: 96 mmol/L — ABNORMAL LOW (ref 98–111)
Creatinine, Ser: 1.72 mg/dL — ABNORMAL HIGH (ref 0.61–1.24)
GFR calc Af Amer: 40 mL/min — ABNORMAL LOW (ref >60)
GFR calc non Af Amer: 34 mL/min — ABNORMAL LOW (ref >60)
Glucose, Bld: 332 mg/dL — ABNORMAL HIGH (ref 70–99)
Potassium: 4.5 mmol/L (ref 3.5–5.1)
Sodium: 133 mmol/L — ABNORMAL LOW (ref 135–145)

## 2020-08-04 LAB — GLUCOSE, CAPILLARY
Glucose-Capillary: 288 mg/dL — ABNORMAL HIGH (ref 70–99)
Glucose-Capillary: 244 mg/dL — ABNORMAL HIGH (ref 70–99)
Glucose-Capillary: 295 mg/dL — ABNORMAL HIGH (ref 70–99)
Glucose-Capillary: 294 mg/dL — ABNORMAL HIGH (ref 70–99)

## 2020-08-04 MED ORDER — ENOXAPARIN SODIUM 40 MG/0.4ML ~~LOC~~ SOLN
40.0000 mg | SUBCUTANEOUS | Status: DC
  Administered 2020-08-05 – 2020-08-08 (×4): 40 mg via SUBCUTANEOUS
  Filled 2020-08-04 (×4): qty 0.4

## 2020-08-04 MED ORDER — INSULIN GLARGINE 100 UNIT/ML ~~LOC~~ SOLN
10.0000 [IU] | Freq: Two times a day (BID) | SUBCUTANEOUS | Status: DC
  Administered 2020-08-04 – 2020-08-08 (×8): 10 [IU] via SUBCUTANEOUS
  Filled 2020-08-04 (×12): qty 0.1

## 2020-08-04 NOTE — Care Management Important Message (Signed)
Important Message  Patient Details  Name: BALTAZAR PEKALA MRN: [id number] Date of Birth: June 25, 1928   Medicare Important Message Given:  Yes - Important Message mailed due to current National Emergency  Verbal consent obtained due to current National Emergency  Relationship to patient: Spouse/Significant Other Contact Name: Aiken Withem Call Date: 08/04/20  Time: 1215 Phone: [phone] Outcome: Spoke with contact Important Message mailed to: Other (must enter comment) (patients wife requested NCM or LCSW come to room to speak in more detail about this. LCSW notified.)    Delorse Lek 08/04/2020, 12:18 PM

## 2020-08-04 NOTE — Progress Notes (Signed)
Orthopaedic Trauma Service (OTS)  3 Days Post-Op Procedure(s) (LRB): INTRAMEDULLARY (IM) RETROGRADE FEMORAL NAILING (Left)  Subjective: Patient reports pain as mild.    Objective: Current Vitals Blood pressure (!) 148/63, pulse 69, temperature 97.7 F (36.5 C), temperature source Oral, resp. rate 17, height 6\' 2"  (1.88 m), weight 95.3 kg, SpO2 96 %. Vital signs in last 24 hours: Temp:  [97.7 F (36.5 C)-98 F (36.7 C)] 97.7 F (36.5 C) (09/24 0739) Pulse Rate:  [69-72] 69 (09/24 0739) Resp:  [17-18] 17 (09/24 0739) BP: (121-156)/(52-64) 148/63 (09/24 0739) SpO2:  [95 %-98 %] 96 % (09/24 0836)  Intake/Output from previous day: 09/23 0701 - 09/24 0700 In: 720 [P.O.:720] Out: 1600 [Urine:1600]  LABS Recent Labs    08/02/20 0952 08/04/20 0419  HGB 11.1* 10.3*   Recent Labs    08/02/20 0952 08/04/20 0419  WBC 10.5 10.2  RBC 3.53* 3.37*  HCT 35.6* 32.9*  PLT 314 277   Recent Labs    08/02/20 0952 08/04/20 0419  NA 130* 133*  K 5.6* 4.5  CL 96* 96*  CO2 21* 28  BUN 60* 48*  CREATININE 2.55* 1.72*  GLUCOSE 510* 332*  CALCIUM 7.7* 8.0*   No results for input(s): LABPT, INR in the last 72 hours.   Physical Exam LLE Dressing intact, clean, dry  Edema/ swelling controlled  Sens: DPN, SPN, TN intact  Motor: EHL, FHL, and lessor toe ext and flex all intact grossly  Brisk cap refill, warm to touch  No pain with gentle ROM  Assessment/Plan: 3 Days Post-Op Procedure(s) (LRB): INTRAMEDULLARY (IM) RETROGRADE FEMORAL NAILING (Left) 1. PT/OT NWB on the LLE with unrestriced ROM of the knee; post hip precautions left hip 2. DVT proph Lovenox 3. Patient and wife to discuss with primary service and Dr. Louis Meckel difficulty sleeping as well as the side effects of Tylenol pm which can exacerbate urinary retention 4. F/u 8-14 days  Altamese Laie, MD [facility] [phone] (p)

## 2020-08-04 NOTE — Progress Notes (Signed)
PROGRESS NOTE    [name] Small  GYB:[id number] DOB: [birth year] DOA: 07/31/2020 PCP: Tonia Ghent, MD   Brief Narrative:  This is an 86 year old male with past medical history of HFpEF, CAD, CKD 3, COPD, recurrent UTIs, left femur fracture repair in June, diabetes who presented to the ED with a chief complaint of a fall and left leg pain.  He had been feeling unwell lately with increased urinary urgency over the weekend.  He had a fall on Saturday with subsequent left hip pain but did not come straight to the ED.  Today his occupational therapist was evaluating him and recommended that he come to the ED.  He has had significant pain in his left leg since.  No loss of consciousness, no head trauma.  He is not on any blood thinners.  No other pain. Of note he was found to have a multidrug-resistant E. coli UTI on 9/1 and was prescribed Keflex and found to have recurrent UTI recently at the urology office and was supposed to start Keflex today.  He does admit to urinary urgency and occasionally urinary retention. In ED: afebrile hemodynamically stable on room air.  Notable labs: Glucose 150, creatinine 1.94 (baseline 1.25 in July), WBC 12.1.  Left femur x-ray: New mildly angulated oblique fracture involving the distal left femoral shaft around the 2 most distal screws of the previously placed internal fixation plate.  ED physician discussed with Dr. [name], orthopedics, who recommended admission/transfer to [facility].  Patient met as above with mechanical fall with left leg pain in the same region as previous fracture.  Patient evaluated by orthopedic surgery, now status post ORIF tolerated procedure well working with physical therapy but ultimately will need disposition to skilled nursing facility for ongoing PT and close monitoring with elevated fall risk.  Patient clinically and medically stable for discharge at this time, awaiting bed placement and approval with insurance at this  time.  Assessment & Plan:   Principal Problem:   Femur fracture, left (HCC) Active Problems:   Type 2 diabetes mellitus with diabetic polyneuropathy (HCC)   BPH (benign prostatic hyperplasia)   Acute kidney injury ([place])   Periprosthetic hip fracture   Recurrent UTI   Pressure injury of skin   Left femur fracture s/p mechanical fall with history of recent left femur fracture s/p ORIF by Dr. [name] previously on 04/19/2020 Orthopedic surgery consulted Status post ORIF 9/21; tolerated procedure quite well Pain management/DVT ppx per ortho PT/OT to follow -currently recommending SNF  Recurrent UTI with history of multidrug-resistant E. coli UTI Urology recommending doxycycline for 10 days given cultures, every 4 hour bladder scans with in/out cath if greater than 400 cc urine noted  Suspected AKI vs worsening CKD 3b, improving Creatinine 1.25 in July Unclear if worsening baseline function vs more acute process Worsening despite LR bolus on the 22nd, continue LR at 50 cc/h x1L Encourage increased p.o. intake Lab Results  Component Value Date   CREATININE 1.72 (H) 08/04/2020   CREATININE 2.55 (H) 08/02/2020   CREATININE 1.99 (H) 07/31/2020   Profound electrolyte abnormalities in the setting of fluid restriction - Patient has moderate hyponatremia, hyperkalemia, hypochloremia and hyperglycemia with worsening creatinine as above -Continue IV fluids, increase p.o. intake, Lokelma x1, insulin as below  Intermittent urinary retention with history of enlarged prostate  Follows with urology Straight caths at home Continue tamsulosin Every 4 hour bladder scans with in/out cath if greater than 400 cc urine noted  Generalized weakness, likely  multifactorial: UTI, debility PT following appreciate insight and recommendations, likely requiring SNF  COPD, not in exacerbation Continue home Spiriva with Breo  HFpEF, not in exacerbation Daily weights and intake/output  Uncontrolled  insulin-dependent diabetes mellitus type 2  Increase Lantus to 5 units twice daily, monitor for hypoglycemia Initiate sliding scale insulin - follow for hypoglycemia Lab Results  Component Value Date   HGBA1C 8.6 (H) 07/20/2020   Stage II, sacral decubitus ulcer, POA -Continue bedside wound care  DVT prophylaxis: Heparin Code Status: DNR Family Communication: Wife at bedside  Status is: Inpatient  Dispo: The patient is from: Home              Anticipated d/c is to: SNF              Anticipated d/c date is: 24-48 hours pending clinical course/bed availability              Patient currently not medically stable for discharge due to ongoing need for further surgical evaluation in the setting of fracture and mechanical fall.  Consultants:   Ortho  Procedures:   ORIF 08/01/2020 per orthopedics  Antimicrobials:  Perioperatively per orthopedics  Subjective: No acute issues or events overnight, pain currently well controlled, looking forward to further ambulation with physical therapy today and ultimate disposition to rehab, both patient and wife are looking forward to discharge with hopes to turn home soon after.  Objective: Vitals:   08/03/20 1943 08/04/20 0500 08/04/20 0739 08/04/20 0836  BP: (!) 125/54 (!) 156/64 (!) 148/63   Pulse: 71 69 69   Resp: 17 18 17    Temp: 97.9 F (36.6 C) 98 F (36.7 C) 97.7 F (36.5 C)   TempSrc: Oral Oral Oral   SpO2: 98% 98% 95% 96%  Weight:      Height:        Intake/Output Summary (Last 24 hours) at 08/04/2020 1358 Last data filed at 08/04/2020 1100 Gross per 24 hour  Intake 480 ml  Output 2500 ml  Net -2020 ml   Filed Weights   07/31/20 1030 08/01/20 1330  Weight: 95.3 kg 95.3 kg    Examination:  General:  Pleasantly resting in bed, No acute distress. HEENT:  Normocephalic atraumatic.  Sclerae nonicteric, noninjected.  Extraocular movements intact bilaterally. Neck:  Without mass or deformity.  Trachea is midline. Lungs:   Clear to auscultate bilaterally without rhonchi, wheeze, or rales. Heart:  Regular rate and rhythm.  Without murmurs, rubs, or gallops. Abdomen:  Soft, nontender, nondistended.  Without guarding or rebound. Extremities: Without cyanosis, clubbing, edema, or obvious deformity, left hip bandage clean dry intact. Vascular:  Dorsalis pedis and posterior tibial pulses palpable bilaterally. Skin:  Warm and dry, no erythema, no ulcerations; left hip bandage clean dry intact.  Data Reviewed: I have personally reviewed following labs and imaging studies  CBC: Recent Labs  Lab 07/31/20 1420 07/31/20 1659 08/02/20 0952 08/04/20 0419  WBC 12.1* 12.6* 10.5 10.2  HGB 12.7* 12.3* 11.1* 10.3*  HCT 40.1 39.1 35.6* 32.9*  MCV 98.8 99.0 100.8* 97.6  PLT 305 286 314 734   Basic Metabolic Panel: Recent Labs  Lab 07/31/20 1420 07/31/20 1659 08/02/20 0952 08/04/20 0419  NA 137  --  130* 133*  K 4.7  --  5.6* 4.5  CL 99  --  96* 96*  CO2 30  --  21* 28  GLUCOSE 150*  --  510* 332*  BUN 46*  --  60* 48*  CREATININE 1.94* 1.99*  2.55* 1.72*  CALCIUM 8.4*  --  7.7* 8.0*   GFR: Estimated Creatinine Clearance: 33.9 mL/min (A) (by C-G formula based on SCr of 1.72 mg/dL (H)). Liver Function Tests: Recent Labs  Lab 07/31/20 1420  AST 14*  ALT 11  ALKPHOS 80  BILITOT 0.5  PROT 7.4  ALBUMIN 3.3*   No results for input(s): LIPASE, AMYLASE in the last 168 hours. No results for input(s): AMMONIA in the last 168 hours. Coagulation Profile: No results for input(s): INR, PROTIME in the last 168 hours. Cardiac Enzymes: No results for input(s): CKTOTAL, CKMB, CKMBINDEX, TROPONINI in the last 168 hours. BNP (last 3 results) No results for input(s): PROBNP in the last 8760 hours. HbA1C: No results for input(s): HGBA1C in the last 72 hours. CBG: Recent Labs  Lab 08/03/20 1137 08/03/20 1639 08/03/20 2009 08/04/20 0643 08/04/20 1158  GLUCAP 204* 267* 221* 288* 244*   Lipid Profile: No results  for input(s): CHOL, HDL, LDLCALC, TRIG, CHOLHDL, LDLDIRECT in the last 72 hours. Thyroid Function Tests: No results for input(s): TSH, T4TOTAL, FREET4, T3FREE, THYROIDAB in the last 72 hours. Anemia Panel: No results for input(s): VITAMINB12, FOLATE, FERRITIN, TIBC, IRON, RETICCTPCT in the last 72 hours. Sepsis Labs: No results for input(s): PROCALCITON, LATICACIDVEN in the last 168 hours.  Recent Results (from the past 240 hour(s))  SARS Coronavirus 2 by RT PCR (hospital order, performed in [facility] hospital lab) Nasopharyngeal Nasopharyngeal Swab     Status: None   Collection Time: 07/31/20  2:20 PM   Specimen: Nasopharyngeal Swab  Result Value Ref Range Status   SARS Coronavirus 2 NEGATIVE NEGATIVE Final    Comment: (NOTE) SARS-CoV-2 target nucleic acids are NOT DETECTED.  The SARS-CoV-2 RNA is generally detectable in upper and lower respiratory specimens during the acute phase of infection. The lowest concentration of SARS-CoV-2 viral copies this assay can detect is 250 copies / mL. A negative result does not preclude SARS-CoV-2 infection and should not be used as the sole basis for treatment or other patient management decisions.  A negative result may occur with improper specimen collection / handling, submission of specimen other than nasopharyngeal swab, presence of viral mutation(s) within the areas targeted by this assay, and inadequate number of viral copies (<250 copies / mL). A negative result must be combined with clinical observations, patient history, and epidemiological information.  Fact Sheet for Patients:   StrictlyIdeas.no  Fact Sheet for Healthcare Providers: BankingDealers.co.za  This test is not yet approved or  cleared by the [place] FDA and has been authorized for detection and/or diagnosis of SARS-CoV-2 by FDA under an Emergency Use Authorization (EUA).  This EUA will remain in effect (meaning this  test can be used) for the duration of the COVID-19 declaration under Section 564(b)(1) of the Act, 21 U.S.C. section 360bbb-3(b)(1), unless the authorization is terminated or revoked sooner.  Performed at [facility], [place] [address] [place], [place] [address]   Surgical PCR screen     Status: None   Collection Time: 07/31/20  4:05 PM   Specimen: Nasal Mucosa; Nasal Swab  Result Value Ref Range Status   MRSA, PCR NEGATIVE NEGATIVE Final   Staphylococcus aureus NEGATIVE NEGATIVE Final    Comment: (NOTE) The Xpert SA Assay (FDA approved for NASAL specimens in patients 62 years of age and older), is one component of a comprehensive surveillance program. It is not intended to diagnose infection nor to guide or monitor treatment. Performed at [facility], [place] [address], [place], [place] [address]   Surgical pcr screen     Status: None   Collection Time: 07/31/20 10:18 PM   Specimen: Nasal Mucosa; Nasal Swab  Result Value Ref Range Status   MRSA, PCR NEGATIVE NEGATIVE Final   Staphylococcus aureus NEGATIVE NEGATIVE Final    Comment: (NOTE) The Xpert SA Assay (FDA approved for NASAL specimens in patients 38 years of age and older), is one component of a comprehensive surveillance program. It is not intended to diagnose infection nor to guide or monitor treatment. Performed at [facility], [place] [address] [place],  [address]          Radiology Studies: No results found. Scheduled Meds: . Apremilast  30 mg Oral Daily  . atorvastatin  10 mg Oral Daily  . calcium carbonate  1 tablet Oral Q breakfast  . doxycycline  100 mg Oral Q12H  . [START ON 08/05/2020] enoxaparin (LOVENOX) injection  40 mg Subcutaneous Q24H  . escitalopram  5 mg Oral Daily  . feeding supplement (GLUCERNA SHAKE)  237 mL Oral BID BM  . fluticasone furoate-vilanterol  1 puff Inhalation Daily  . insulin aspart  0-15 Units Subcutaneous TID WC  . insulin  aspart  0-5 Units Subcutaneous QHS  . insulin glargine  10 Units Subcutaneous BID  . multivitamin with minerals  1 tablet Oral Daily  . mupirocin ointment  1 application Nasal BID  . Ensure Max Protein  11 oz Oral QHS  . tamsulosin  0.8 mg Oral QPC supper  . umeclidinium bromide  1 puff Inhalation Daily   Continuous Infusions:    LOS: 4 days   Time spent: 49mn  [name] Gidney C Zayin Valadez, DO [facility]  If 7PM-7AM, please contact night-coverage [email or web address]  08/04/2020, 1:58 PM

## 2020-08-04 NOTE — Plan of Care (Signed)
  Problem: Education: Goal: Knowledge of General Education information will improve Description: Including pain rating scale, medication(s)/side effects and non-pharmacologic comfort measures Outcome: Progressing   Problem: Activity: Goal: Risk for activity intolerance will decrease Outcome: Progressing   Problem: Pain Managment: Goal: General experience of comfort will improve Outcome: Progressing Patient states prn pain meds adequate to control pain.

## 2020-08-05 LAB — GLUCOSE, CAPILLARY
Glucose-Capillary: 145 mg/dL — ABNORMAL HIGH (ref 70–99)
Glucose-Capillary: 262 mg/dL — ABNORMAL HIGH (ref 70–99)
Glucose-Capillary: 293 mg/dL — ABNORMAL HIGH (ref 70–99)
Glucose-Capillary: 211 mg/dL — ABNORMAL HIGH (ref 70–99)

## 2020-08-05 MED ORDER — MELATONIN 5 MG PO TABS
10.0000 mg | ORAL_TABLET | Freq: Every day | ORAL | Status: DC
  Administered 2020-08-05 – 2020-08-06 (×2): 10 mg via ORAL
  Filled 2020-08-05 (×2): qty 2

## 2020-08-05 NOTE — Progress Notes (Signed)
Occupational Therapy Treatment Patient Details Name: [name] Small MRN: [id number] DOB: 1928/06/20 Today's Date: 08/05/2020    History of present illness Pt is an 86 y/o male who sustained twisting injury with fall resulting in left leg pain and inability to bear weight. Urinary tract infection with multidrug resistant E Coli. X-rays demonstrate fracture below the plate which was used to fix his previous fracture below a hemiarthroplasty. Pt underwent surgical repair with TDWB and posterior hip precautions   OT comments  Pt. Was seen for skilled OT to maximize function with  ADLs and transfers. Pt. Was able to perform ADLs sitting EOB with good balance. Pt. Was able to recile TDWB for L LE. Pt. Needed cues for posterior hip precautions. Pt. And wife are in agreement for SNF> Pt. Wife wants to discuss dc options with case Freight forwarder. Asked nursing aid to let dc planners know.   Follow Up Recommendations  SNF    Equipment Recommendations  None recommended by OT    Recommendations for Other Services      Precautions / Restrictions Precautions Precautions: Fall;Posterior Hip Restrictions Weight Bearing Restrictions: Yes LLE Weight Bearing: Touchdown weight bearing       Mobility Bed Mobility Overal bed mobility: Needs Assistance Bed Mobility: Supine to Sit     Supine to sit: Min assist        Transfers Overall transfer level: Needs assistance Equipment used: Rolling walker (2 wheeled) Transfers: Sit to/from [employer] Transfers Sit to Stand: +2 physical assistance;Min assist Stand pivot transfers: +2 safety/equipment;Min assist            Balance     Sitting balance-Leahy Scale: Good       Standing balance-Leahy Scale: Poor                             ADL either performed or assessed with clinical judgement   ADL Overall ADL's : Needs assistance/impaired Eating/Feeding: Set up;Sitting   Grooming: Supervision/safety;Sitting   Upper Body  Bathing: Supervision/ safety;Sitting   Lower Body Bathing: Moderate assistance;Sit to/from stand;+2 for safety/equipment   Upper Body Dressing : Supervision/safety;Sitting   Lower Body Dressing: Maximal assistance;+2 for safety/equipment;Sit to/from stand   Toilet Transfer: Minimal assistance;+2 for safety/equipment;Adhering to hip precautions;Cueing for safety;Stand-pivot           Functional mobility during ADLs: Minimal assistance;+2 for safety/equipment;Rolling walker;Cueing for safety General ADL Comments: Pt. ed on use of AE for LE dressing and will need additional training.      Vision   Vision Assessment?: No apparent visual deficits   Perception     Praxis      Cognition Arousal/Alertness: Awake/alert Behavior During Therapy: WFL for tasks assessed/performed Overall Cognitive Status: Within Functional Limits for tasks assessed Area of Impairment: Memory                     Memory: Decreased short-term memory;Decreased recall of precautions         General Comments: A&Ox4. Some difficulties recalling posterior hip precautions, following safety commands but overall functional        Exercises     Shoulder Instructions       General Comments Discussed hip and wb precautions.     Pertinent Vitals/ Pain       Pain Assessment: 0-10 Pain Score: 6  Pain Location: L LE Pain Descriptors / Indicators: Grimacing;Operative site guarding Pain Intervention(s): Patient requesting pain meds-RN notified;Limited activity  within patient's tolerance  Home Living                                          Prior Functioning/Environment              Frequency  Min 2X/week        Progress Toward Goals  OT Goals(current goals can now be found in the care plan section)  Progress towards OT goals: Progressing toward goals  Acute Rehab OT Goals Patient Stated Goal: they want snf for rehab OT Goal Formulation: With  patient/family Time For Goal Achievement: 08/16/20 Potential to Achieve Goals: Good ADL Goals Pt Will Perform Lower Body Bathing: with supervision;sit to/from stand;sitting/lateral leans;with adaptive equipment Pt Will Perform Lower Body Dressing: with supervision;sitting/lateral leans;sit to/from stand;with adaptive equipment Pt Will Transfer to Toilet: with min guard assist;ambulating;bedside commode Pt Will Perform Toileting - Clothing Manipulation and hygiene: with supervision;sit to/from stand;sitting/lateral leans Additional ADL Goal #1: Pt to verbalize 3/3 posterior hip precautions Independently in order to maximize healing and safety during ADLs Additional ADL Goal #2: Pt to verbalize at least 2 fall prevention strategies to maximize safety at home.  Plan      Co-evaluation                 AM-PAC OT "6 Clicks" Daily Activity     Outcome Measure   Help from another person eating meals?: A Little Help from another person taking care of personal grooming?: A Little Help from another person toileting, which includes using toliet, bedpan, or urinal?: A Lot Help from another person bathing (including washing, rinsing, drying)?: A Lot Help from another person to put on and taking off regular upper body clothing?: A Little Help from another person to put on and taking off regular lower body clothing?: A Lot 6 Click Score: 15    End of Session Equipment Utilized During Treatment: Gait belt;Rolling walker;Oxygen      Activity Tolerance Patient tolerated treatment well   Patient Left in chair;with call bell/phone within reach;with chair alarm set;with family/visitor present   Nurse Communication Patient requests pain meds        Time: 0930-1008 OT Time Calculation (min): 38 min  Charges: OT General Charges $OT Visit: 1 Visit OT Treatments $Self Care/Home Management : 38-52 mins  Andy Allende OT/L;   Quincey Quesinberry 08/05/2020, 10:20 AM

## 2020-08-05 NOTE — Progress Notes (Signed)
PROGRESS NOTE    [name] Small  QVZ:[id number] DOB: 03/08/1928 DOA: 07/31/2020 PCP: Tonia Ghent, MD   Brief Narrative:  This is an 86 year old male with past medical history of HFpEF, CAD, CKD 3, COPD, recurrent UTIs, left femur fracture repair in June, diabetes who presented to the ED with a chief complaint of a fall and left leg pain.  He had been feeling unwell lately with increased urinary urgency over the weekend.  He had a fall on Saturday with subsequent left hip pain but did not come straight to the ED.  Today his occupational therapist was evaluating him and recommended that he come to the ED.  He has had significant pain in his left leg since.  No loss of consciousness, no head trauma.  He is not on any blood thinners.  No other pain. Of note he was found to have a multidrug-resistant E. coli UTI on 9/1 and was prescribed Keflex and found to have recurrent UTI recently at the urology office and was supposed to start Keflex today.  He does admit to urinary urgency and occasionally urinary retention. In ED: afebrile hemodynamically stable on room air.  Notable labs: Glucose 150, creatinine 1.94 (baseline 1.25 in July), WBC 12.1.  Left femur x-ray: New mildly angulated oblique fracture involving the distal left femoral shaft around the 2 most distal screws of the previously placed internal fixation plate.  ED physician discussed with Dr. [name], orthopedics, who recommended admission/transfer to [facility].  Patient met as above with mechanical fall with left leg pain in the same region as previous fracture.  Patient evaluated by orthopedic surgery, now status post ORIF tolerated procedure well working with physical therapy but ultimately will need disposition to skilled nursing facility for ongoing PT and close monitoring with elevated fall risk.  Patient clinically and medically stable for discharge at this time, awaiting bed placement and approval with insurance at this  time.  Assessment & Plan:   Principal Problem:   Femur fracture, left (HCC) Active Problems:   Type 2 diabetes mellitus with diabetic polyneuropathy (HCC)   BPH (benign prostatic hyperplasia)   Acute kidney injury ([name])   Periprosthetic hip fracture   Recurrent UTI   Pressure injury of skin  Left femur fracture s/p mechanical fall with history of recent left femur fracture s/p ORIF by Dr. [name] previously on 04/19/2020 Orthopedic surgery consulted Status post ORIF 9/21; tolerated procedure quite well Pain management/DVT ppx per ortho PT/OT to follow -currently recommending SNF Increased melatonin 10 mg nightly given poor sleep hygiene over the past 24 hours postoperatively  Recurrent UTI with history of multidrug-resistant E. coli UTI Urology recommending doxycycline for 10 days given cultures, every 4 hour bladder scans with in/out cath if greater than 400 cc urine noted  Suspected AKI vs worsening CKD 3b, improving Creatinine 1.25 in July Unclear if worsening baseline function vs more acute process Worsening despite LR bolus on the 22nd, continue LR at 50 cc/h x1L Encourage increased p.o. intake Lab Results  Component Value Date   CREATININE 1.72 (H) 08/04/2020   CREATININE 2.55 (H) 08/02/2020   CREATININE 1.99 (H) 07/31/2020   Profound electrolyte abnormalities in the setting of fluid restriction - Patient has moderate hyponatremia, hyperkalemia, hypochloremia and hyperglycemia with worsening creatinine as above -Continue IV fluids, increase p.o. intake, Lokelma x1, insulin as below  Intermittent urinary retention with history of enlarged prostate  Follows with urology Straight caths at home Continue tamsulosin Every 4 hour bladder scans  with in/out cath if greater than 400 cc urine noted  Generalized weakness, likely multifactorial: UTI, debility PT following appreciate insight and recommendations, likely requiring SNF  COPD, not in exacerbation Continue home  Spiriva with Breo  HFpEF, not in exacerbation Daily weights and intake/output  Uncontrolled insulin-dependent diabetes mellitus type 2  Increase Lantus to 5 units twice daily, monitor for hypoglycemia Initiate sliding scale insulin - follow for hypoglycemia Lab Results  Component Value Date   HGBA1C 8.6 (H) 07/20/2020   Stage II, sacral decubitus ulcer, POA -Continue bedside wound care  DVT prophylaxis: Heparin Code Status: DNR Family Communication: Wife at bedside  Status is: Inpatient  Dispo: The patient is from: Home              Anticipated d/c is to: SNF              Anticipated d/c date is: 24-48 hours pending clinical course/bed availability              Patient currently not medically stable for discharge due to ongoing need for further surgical evaluation in the setting of fracture and mechanical fall.  Consultants:   Ortho  Procedures:   ORIF 08/01/2020 per orthopedics  Antimicrobials:  Perioperatively per orthopedics  Subjective: No acute issues or events overnight, slept somewhat well overnight but still having issues falling asleep due to hospital bed and noise, increase melatonin as discussed.  Otherwise denies chest pain, shortness of breath, nausea, vomiting, diarrhea, constipation, headache, fever, chills.  Objective: Vitals:   08/04/20 0836 08/04/20 1448 08/04/20 2100 08/05/20 0500  BP:  (!) 122/58 123/60 120/65  Pulse:  78 80 79  Resp:  _0 Temp:  97.8 F (36.6 C) 97.7 F (36.5 C) 97.8 F (36.6 C)  TempSrc:  Oral Oral Oral  SpO2: 96% 97% 98% 97%  Weight:   95.9 kg   Height:        Intake/Output Summary (Last 24 hours) at 08/05/2020 0811 Last data filed at 08/05/2020 0600 Gross per 24 hour  Intake 880 ml  Output 1750 ml  Net -870 ml   Filed Weights   07/31/20 1030 08/01/20 1330 08/04/20 2100  Weight: 95.3 kg 95.3 kg 95.9 kg    Examination:  General:  Pleasantly resting in bed, No acute distress. HEENT:  Normocephalic  atraumatic.  Sclerae nonicteric, noninjected.  Extraocular movements intact bilaterally. Neck:  Without mass or deformity.  Trachea is midline. Lungs:  Clear to auscultate bilaterally without rhonchi, wheeze, or rales. Heart:  Regular rate and rhythm.  Without murmurs, rubs, or gallops. Abdomen:  Soft, nontender, nondistended.  Without guarding or rebound. Extremities: Without cyanosis, clubbing, edema, or obvious deformity, left hip bandage clean dry intact. Vascular:  Dorsalis pedis and posterior tibial pulses palpable bilaterally. Skin:  Warm and dry, no erythema, no ulcerations; left hip bandage clean dry intact.  Data Reviewed: I have personally reviewed following labs and imaging studies  CBC: Recent Labs  Lab 07/31/20 1420 07/31/20 1659 08/02/20 0952 08/04/20 0419  WBC 12.1* 12.6* 10.5 10.2  HGB 12.7* 12.3* 11.1* 10.3*  HCT 40.1 39.1 35.6* 32.9*  MCV 98.8 99.0 100.8* 97.6  PLT 305 286 314 017   Basic Metabolic Panel: Recent Labs  Lab 07/31/20 1420 07/31/20 1659 08/02/20 0952 08/04/20 0419  NA 137  --  130* 133*  K 4.7  --  5.6* 4.5  CL 99  --  96* 96*  CO2 30  --  21* 28  GLUCOSE  150*  --  510* 332*  BUN 46*  --  60* 48*  CREATININE 1.94* 1.99* 2.55* 1.72*  CALCIUM 8.4*  --  7.7* 8.0*   GFR: Estimated Creatinine Clearance: 33.9 mL/min (A) (by C-G formula based on SCr of 1.72 mg/dL (H)). Liver Function Tests: Recent Labs  Lab 07/31/20 1420  AST 14*  ALT 11  ALKPHOS 80  BILITOT 0.5  PROT 7.4  ALBUMIN 3.3*   No results for input(s): LIPASE, AMYLASE in the last 168 hours. No results for input(s): AMMONIA in the last 168 hours. Coagulation Profile: No results for input(s): INR, PROTIME in the last 168 hours. Cardiac Enzymes: No results for input(s): CKTOTAL, CKMB, CKMBINDEX, TROPONINI in the last 168 hours. BNP (last 3 results) No results for input(s): PROBNP in the last 8760 hours. HbA1C: No results for input(s): HGBA1C in the last 72  hours. CBG: Recent Labs  Lab 08/04/20 0643 08/04/20 1158 08/04/20 1714 08/04/20 2033 08/05/20 0650  GLUCAP 288* 244* 295* 294* 145*   Lipid Profile: No results for input(s): CHOL, HDL, LDLCALC, TRIG, CHOLHDL, LDLDIRECT in the last 72 hours. Thyroid Function Tests: No results for input(s): TSH, T4TOTAL, FREET4, T3FREE, THYROIDAB in the last 72 hours. Anemia Panel: No results for input(s): VITAMINB12, FOLATE, FERRITIN, TIBC, IRON, RETICCTPCT in the last 72 hours. Sepsis Labs: No results for input(s): PROCALCITON, LATICACIDVEN in the last 168 hours.  Recent Results (from the past 240 hour(s))  SARS Coronavirus 2 by RT PCR (hospital order, performed in [facility] hospital lab) Nasopharyngeal Nasopharyngeal Swab     Status: None   Collection Time: 07/31/20  2:20 PM   Specimen: Nasopharyngeal Swab  Result Value Ref Range Status   SARS Coronavirus 2 NEGATIVE NEGATIVE Final    Comment: (NOTE) SARS-CoV-2 target nucleic acids are NOT DETECTED.  The SARS-CoV-2 RNA is generally detectable in upper and lower respiratory specimens during the acute phase of infection. The lowest concentration of SARS-CoV-2 viral copies this assay can detect is 250 copies / mL. A negative result does not preclude SARS-CoV-2 infection and should not be used as the sole basis for treatment or other patient management decisions.  A negative result may occur with improper specimen collection / handling, submission of specimen other than nasopharyngeal swab, presence of viral mutation(s) within the areas targeted by this assay, and inadequate number of viral copies (<250 copies / mL). A negative result must be combined with clinical observations, patient history, and epidemiological information.  Fact Sheet for Patients:   StrictlyIdeas.no  Fact Sheet for Healthcare Providers: BankingDealers.co.za  This test is not yet approved or  cleared by the [place]  FDA and has been authorized for detection and/or diagnosis of SARS-CoV-2 by FDA under an Emergency Use Authorization (EUA).  This EUA will remain in effect (meaning this test can be used) for the duration of the COVID-19 declaration under Section 564(b)(1) of the Act, 21 U.S.C. section 360bbb-3(b)(1), unless the authorization is terminated or revoked sooner.  Performed at [facility], [place] [address], [place], [place] [address]   Surgical PCR screen     Status: None   Collection Time: 07/31/20  4:05 PM   Specimen: Nasal Mucosa; Nasal Swab  Result Value Ref Range Status   MRSA, PCR NEGATIVE NEGATIVE Final   Staphylococcus aureus NEGATIVE NEGATIVE Final    Comment: (NOTE) The Xpert SA Assay (FDA approved for NASAL specimens in patients 67 years of age and older), is one component of a comprehensive surveillance program.  It is not intended to diagnose infection nor to guide or monitor treatment. Performed at [facility], [place] [address], [place], [place] [address]   Surgical pcr screen     Status: None   Collection Time: 07/31/20 10:18 PM   Specimen: Nasal Mucosa; Nasal Swab  Result Value Ref Range Status   MRSA, PCR NEGATIVE NEGATIVE Final   Staphylococcus aureus NEGATIVE NEGATIVE Final    Comment: (NOTE) The Xpert SA Assay (FDA approved for NASAL specimens in patients 62 years of age and older), is one component of a comprehensive surveillance program. It is not intended to diagnose infection nor to guide or monitor treatment. Performed at [facility], [place] [address] [place], [place] [address]          Radiology Studies: No results found. Scheduled Meds: . Apremilast  30 mg Oral Daily  . atorvastatin  10 mg Oral Daily  . calcium carbonate  1 tablet Oral Q breakfast  . doxycycline  100 mg Oral Q12H  . enoxaparin (LOVENOX) injection  40 mg Subcutaneous Q24H  . escitalopram  5 mg Oral Daily  . feeding supplement (GLUCERNA  SHAKE)  237 mL Oral BID BM  . fluticasone furoate-vilanterol  1 puff Inhalation Daily  . insulin aspart  0-15 Units Subcutaneous TID WC  . insulin aspart  0-5 Units Subcutaneous QHS  . insulin glargine  10 Units Subcutaneous BID  . melatonin  10 mg Oral QHS  . multivitamin with minerals  1 tablet Oral Daily  . mupirocin ointment  1 application Nasal BID  . Ensure Max Protein  11 oz Oral QHS  . tamsulosin  0.8 mg Oral QPC supper  . umeclidinium bromide  1 puff Inhalation Daily   Continuous Infusions:    LOS: 5 days   Time spent: 80mn  Anola Mcgough C Peggy Loge, DO [facility]  If 7PM-7AM, please contact night-coverage [email or web address]  08/05/2020, 8:11 AM

## 2020-08-05 NOTE — TOC Progression Note (Signed)
Transition of Care [facility]) - Progression Note    Patient Details  Name: [name] Small MRN: [id number] Date of Birth: 06-25-1928  Transition of Care [facility]) CM/SW [place], [place] Phone Number: 08/05/2020, 11:27 AM  Clinical Narrative:     CSW noticed that pt clinicals had not been sent out with SNF request in the hub. CSW sent clinicals out to be reviewed.   Expected Discharge Plan: [place] Barriers to Discharge: Continued Medical Work up  Expected Discharge Plan and Services Expected Discharge Plan: [place] In-house Referral: Clinical Social Work Discharge Planning Services: CM Consult Post Acute Care Choice: [place] arrangements for the past 2 months: [place] Determinants of Health (SDOH) Interventions    Readmission Risk Interventions Readmission Risk Prevention Plan 04/21/2020  Transportation Screening Complete  PCP or Specialist Appt within 5-7 Days Complete  Home Care Screening Complete  Medication Review (RN CM) Complete  Some recent data might be hidden   [name], Latanya Presser, [place] Social Worker [phone]

## 2020-08-06 LAB — GLUCOSE, CAPILLARY
Glucose-Capillary: 111 mg/dL — ABNORMAL HIGH (ref 70–99)
Glucose-Capillary: 242 mg/dL — ABNORMAL HIGH (ref 70–99)
Glucose-Capillary: 320 mg/dL — ABNORMAL HIGH (ref 70–99)
Glucose-Capillary: 61 mg/dL — ABNORMAL LOW (ref 70–99)
Glucose-Capillary: 94 mg/dL (ref 70–99)

## 2020-08-06 MED ORDER — ACETAMINOPHEN 500 MG PO TABS
500.0000 mg | ORAL_TABLET | Freq: Four times a day (QID) | ORAL | Status: DC
  Administered 2020-08-06 – 2020-08-08 (×10): 500 mg via ORAL
  Filled 2020-08-06 (×9): qty 1

## 2020-08-06 MED ORDER — ONDANSETRON HCL 4 MG/2ML IJ SOLN
4.0000 mg | Freq: Four times a day (QID) | INTRAMUSCULAR | Status: DC | PRN
  Administered 2020-08-06: 4 mg via INTRAVENOUS
  Filled 2020-08-06: qty 2

## 2020-08-06 NOTE — TOC Progression Note (Signed)
Transition of Care [facility]) - Progression Note    Patient Details  Name: [name] Small MRN: [id number] Date of Birth: [birth year]  Transition of Care [facility]) CM/SW Contact  Joanne Chars, LCSW Phone Number: 08/06/2020, 2:31 PM  Clinical Narrative:   CSW spoke with pt wife and wife about current bed offers.  They are hoping pt can go to [facility], where pt has been twice before.  TC message left for Olivia Mackie at [facility].     Expected Discharge Plan: [place] Barriers to Discharge: Continued Medical Work up  Expected Discharge Plan and Services Expected Discharge Plan: [place] In-house Referral: Clinical Social Work Discharge Planning Services: CM Consult Post Acute Care Choice: [place] Living arrangements for the past 2 months: Single Family Home                                       Social Determinants of Health (SDOH) Interventions    Readmission Risk Interventions Readmission Risk Prevention Plan 04/21/2020  Transportation Screening Complete  PCP or Specialist Appt within 5-7 Days Complete  Home Care Screening Complete  Medication Review (RN CM) Complete  Some recent data might be hidden

## 2020-08-06 NOTE — Progress Notes (Signed)
PROGRESS NOTE    [name] Small  QVZ:[id number] DOB: 03/08/1928 DOA: 07/31/2020 PCP: Tonia Ghent, MD   Brief Narrative:  This is an 86 year old male with past medical history of HFpEF, CAD, CKD 3, COPD, recurrent UTIs, left femur fracture repair in June, diabetes who presented to the ED with a chief complaint of a fall and left leg pain.  He had been feeling unwell lately with increased urinary urgency over the weekend.  He had a fall on Saturday with subsequent left hip pain but did not come straight to the ED.  Today his occupational therapist was evaluating him and recommended that he come to the ED.  He has had significant pain in his left leg since.  No loss of consciousness, no head trauma.  He is not on any blood thinners.  No other pain. Of note he was found to have a multidrug-resistant E. coli UTI on 9/1 and was prescribed Keflex and found to have recurrent UTI recently at the urology office and was supposed to start Keflex today.  He does admit to urinary urgency and occasionally urinary retention. In ED: afebrile hemodynamically stable on room air.  Notable labs: Glucose 150, creatinine 1.94 (baseline 1.25 in July), WBC 12.1.  Left femur x-ray: New mildly angulated oblique fracture involving the distal left femoral shaft around the 2 most distal screws of the previously placed internal fixation plate.  ED physician discussed with Dr. [name], orthopedics, who recommended admission/transfer to [facility].  Patient met as above with mechanical fall with left leg pain in the same region as previous fracture.  Patient evaluated by orthopedic surgery, now status post ORIF tolerated procedure well working with physical therapy but ultimately will need disposition to skilled nursing facility for ongoing PT and close monitoring with elevated fall risk.  Patient clinically and medically stable for discharge at this time, awaiting bed placement and approval with insurance at this  time.  Assessment & Plan:   Principal Problem:   Femur fracture, left (HCC) Active Problems:   Type 2 diabetes mellitus with diabetic polyneuropathy (HCC)   BPH (benign prostatic hyperplasia)   Acute kidney injury ([name])   Periprosthetic hip fracture   Recurrent UTI   Pressure injury of skin  Left femur fracture s/p mechanical fall with history of recent left femur fracture s/p ORIF by Dr. [name] previously on 04/19/2020 Orthopedic surgery consulted Status post ORIF 9/21; tolerated procedure quite well Pain management/DVT ppx per ortho PT/OT to follow -currently recommending SNF Increased melatonin 10 mg nightly given poor sleep hygiene over the past 24 hours postoperatively  Recurrent UTI with history of multidrug-resistant E. coli UTI Urology recommending doxycycline for 10 days given cultures, every 4 hour bladder scans with in/out cath if greater than 400 cc urine noted  Suspected AKI vs worsening CKD 3b, improving Creatinine 1.25 in July Unclear if worsening baseline function vs more acute process Worsening despite LR bolus on the 22nd, continue LR at 50 cc/h x1L Encourage increased p.o. intake Lab Results  Component Value Date   CREATININE 1.72 (H) 08/04/2020   CREATININE 2.55 (H) 08/02/2020   CREATININE 1.99 (H) 07/31/2020   Profound electrolyte abnormalities in the setting of fluid restriction - Patient has moderate hyponatremia, hyperkalemia, hypochloremia and hyperglycemia with worsening creatinine as above -Continue IV fluids, increase p.o. intake, Lokelma x1, insulin as below  Intermittent urinary retention with history of enlarged prostate  Follows with urology Straight caths at home Continue tamsulosin Every 4 hour bladder scans  with in/out cath if greater than 400 cc urine noted  Generalized weakness, likely multifactorial: UTI, debility PT following appreciate insight and recommendations, likely requiring SNF  COPD, not in exacerbation Continue home  Spiriva with Breo  HFpEF, not in exacerbation Daily weights and intake/output  Uncontrolled insulin-dependent diabetes mellitus type 2  Increase Lantus to 5 units twice daily, monitor for hypoglycemia Initiate sliding scale insulin - follow for hypoglycemia Lab Results  Component Value Date   HGBA1C 8.6 (H) 07/20/2020   Stage II, sacral decubitus ulcer, POA -Continue bedside wound care  DVT prophylaxis: Heparin Code Status: DNR Family Communication: Wife at bedside  Status is: Inpatient  Dispo: The patient is from: Home              Anticipated d/c is to: SNF              Anticipated d/c date is: 24-48 hours pending clinical course/bed availability              Patient currently not medically stable for discharge due to ongoing need for further surgical evaluation in the setting of fracture and mechanical fall.  Consultants:   Ortho  Procedures:   ORIF 08/01/2020 per orthopedics  Antimicrobials:  Perioperatively per orthopedics  Subjective: No acute issues or events overnight, slept much better than previous, pain still well controlled denies nausea, vomiting, diarrhea, headache, fevers, chills.  Minimal constipation symptoms as discussed above.  Objective: Vitals:   08/05/20 1555 08/05/20 1945 08/06/20 0411 08/06/20 0754  BP: 140/61 (!) 104/47 (!) 128/55 (!) 125/55  Pulse: 63 61 (!) 59 60  Resp:  $Remo'16 16 16  'WebsY$ Temp: 98.2 F (36.8 C) (!) 97.5 F (36.4 C) (!) 97.4 F (36.3 C) (!) 97.2 F (36.2 C)  TempSrc: Oral Oral Oral Oral  SpO2: 98% 98% 98% 96%  Weight:      Height:        Intake/Output Summary (Last 24 hours) at 08/06/2020 0810 Last data filed at 08/06/2020 0415 Gross per 24 hour  Intake 100 ml  Output 500 ml  Net -400 ml   Filed Weights   07/31/20 1030 08/01/20 1330 08/04/20 2100  Weight: 95.3 kg 95.3 kg 95.9 kg    Examination:  General:  Pleasantly resting in bed, No acute distress. HEENT:  Normocephalic atraumatic.  Sclerae nonicteric,  noninjected.  Extraocular movements intact bilaterally. Neck:  Without mass or deformity.  Trachea is midline. Lungs:  Clear to auscultate bilaterally without rhonchi, wheeze, or rales. Heart:  Regular rate and rhythm.  Without murmurs, rubs, or gallops. Abdomen:  Soft, nontender, nondistended.  Without guarding or rebound. Extremities: Without cyanosis, clubbing, edema, or obvious deformity, left hip bandage clean dry intact. Vascular:  Dorsalis pedis and posterior tibial pulses palpable bilaterally. Skin:  Warm and dry, no erythema, no ulcerations; left hip bandage clean dry intact.  Data Reviewed: I have personally reviewed following labs and imaging studies  CBC: Recent Labs  Lab 07/31/20 1420 07/31/20 1659 08/02/20 0952 08/04/20 0419  WBC 12.1* 12.6* 10.5 10.2  HGB 12.7* 12.3* 11.1* 10.3*  HCT 40.1 39.1 35.6* 32.9*  MCV 98.8 99.0 100.8* 97.6  PLT 305 286 314 740   Basic Metabolic Panel: Recent Labs  Lab 07/31/20 1420 07/31/20 1659 08/02/20 0952 08/04/20 0419  NA 137  --  130* 133*  K 4.7  --  5.6* 4.5  CL 99  --  96* 96*  CO2 30  --  21* 28  GLUCOSE 150*  --  510* 332*  BUN 46*  --  60* 48*  CREATININE 1.94* 1.99* 2.55* 1.72*  CALCIUM 8.4*  --  7.7* 8.0*   GFR: Estimated Creatinine Clearance: 33.9 mL/min (A) (by C-G formula based on SCr of 1.72 mg/dL (H)). Liver Function Tests: Recent Labs  Lab 07/31/20 1420  AST 14*  ALT 11  ALKPHOS 80  BILITOT 0.5  PROT 7.4  ALBUMIN 3.3*   No results for input(s): LIPASE, AMYLASE in the last 168 hours. No results for input(s): AMMONIA in the last 168 hours. Coagulation Profile: No results for input(s): INR, PROTIME in the last 168 hours. Cardiac Enzymes: No results for input(s): CKTOTAL, CKMB, CKMBINDEX, TROPONINI in the last 168 hours. BNP (last 3 results) No results for input(s): PROBNP in the last 8760 hours. HbA1C: No results for input(s): HGBA1C in the last 72 hours. CBG: Recent Labs  Lab 08/05/20 0650  08/05/20 1223 08/05/20 1554 08/05/20 2133 08/06/20 0711  GLUCAP 145* 262* 293* 211* 111*   Lipid Profile: No results for input(s): CHOL, HDL, LDLCALC, TRIG, CHOLHDL, LDLDIRECT in the last 72 hours. Thyroid Function Tests: No results for input(s): TSH, T4TOTAL, FREET4, T3FREE, THYROIDAB in the last 72 hours. Anemia Panel: No results for input(s): VITAMINB12, FOLATE, FERRITIN, TIBC, IRON, RETICCTPCT in the last 72 hours. Sepsis Labs: No results for input(s): PROCALCITON, LATICACIDVEN in the last 168 hours.  Recent Results (from the past 240 hour(s))  SARS Coronavirus 2 by RT PCR (hospital order, performed in [facility] hospital lab) Nasopharyngeal Nasopharyngeal Swab     Status: None   Collection Time: 07/31/20  2:20 PM   Specimen: Nasopharyngeal Swab  Result Value Ref Range Status   SARS Coronavirus 2 NEGATIVE NEGATIVE Final    Comment: (NOTE) SARS-CoV-2 target nucleic acids are NOT DETECTED.  The SARS-CoV-2 RNA is generally detectable in upper and lower respiratory specimens during the acute phase of infection. The lowest concentration of SARS-CoV-2 viral copies this assay can detect is 250 copies / mL. A negative result does not preclude SARS-CoV-2 infection and should not be used as the sole basis for treatment or other patient management decisions.  A negative result may occur with improper specimen collection / handling, submission of specimen other than nasopharyngeal swab, presence of viral mutation(s) within the areas targeted by this assay, and inadequate number of viral copies (<250 copies / mL). A negative result must be combined with clinical observations, patient history, and epidemiological information.  Fact Sheet for Patients:   StrictlyIdeas.no  Fact Sheet for Healthcare Providers: BankingDealers.co.za  This test is not yet approved or  cleared by the [place] FDA and has been authorized for detection  and/or diagnosis of SARS-CoV-2 by FDA under an Emergency Use Authorization (EUA).  This EUA will remain in effect (meaning this test can be used) for the duration of the COVID-19 declaration under Section 564(b)(1) of the Act, 21 U.S.C. section 360bbb-3(b)(1), unless the authorization is terminated or revoked sooner.  Performed at [facility], [place] [address], [place], [place] [address]   Surgical PCR screen     Status: None   Collection Time: 07/31/20  4:05 PM   Specimen: Nasal Mucosa; Nasal Swab  Result Value Ref Range Status   MRSA, PCR NEGATIVE NEGATIVE Final   Staphylococcus aureus NEGATIVE NEGATIVE Final    Comment: (NOTE) The Xpert SA Assay (FDA approved for NASAL specimens in patients 54 years of age and older), is one component of a comprehensive surveillance program. It is not intended  to diagnose infection nor to guide or monitor treatment. Performed at [facility], [place] [address] [place], [place] [address]   Surgical pcr screen     Status: None   Collection Time: 07/31/20 10:18 PM   Specimen: Nasal Mucosa; Nasal Swab  Result Value Ref Range Status   MRSA, PCR NEGATIVE NEGATIVE Final   Staphylococcus aureus NEGATIVE NEGATIVE Final    Comment: (NOTE) The Xpert SA Assay (FDA approved for NASAL specimens in patients 11 years of age and older), is one component of a comprehensive surveillance program. It is not intended to diagnose infection nor to guide or monitor treatment. Performed at [facility], [place] [address] [place], [place] [address]          Radiology Studies: No results found. Scheduled Meds: . Apremilast  30 mg Oral Daily  . atorvastatin  10 mg Oral Daily  . calcium carbonate  1 tablet Oral Q breakfast  . doxycycline  100 mg Oral Q12H  . enoxaparin (LOVENOX) injection  40 mg Subcutaneous Q24H  . escitalopram  5 mg Oral Daily  . feeding supplement (GLUCERNA SHAKE)  237 mL Oral BID BM  . fluticasone  furoate-vilanterol  1 puff Inhalation Daily  . insulin aspart  0-15 Units Subcutaneous TID WC  . insulin aspart  0-5 Units Subcutaneous QHS  . insulin glargine  10 Units Subcutaneous BID  . melatonin  10 mg Oral QHS  . multivitamin with minerals  1 tablet Oral Daily  . Ensure Max Protein  11 oz Oral QHS  . tamsulosin  0.8 mg Oral QPC supper  . umeclidinium bromide  1 puff Inhalation Daily   Continuous Infusions:    LOS: 6 days   Time spent: 55min  Mihira Tozzi C Sheehan Stacey, DO [facility]  If 7PM-7AM, please contact night-coverage [email or web address]  08/06/2020, 8:10 AM

## 2020-08-06 NOTE — Plan of Care (Signed)

## 2020-08-07 LAB — BASIC METABOLIC PANEL
Anion gap: 8 (ref 5–15)
BUN: 32 mg/dL — ABNORMAL HIGH (ref 8–23)
CO2: 30 mmol/L (ref 22–32)
Calcium: 8.4 mg/dL — ABNORMAL LOW (ref 8.9–10.3)
Chloride: 98 mmol/L (ref 98–111)
Creatinine, Ser: 1.47 mg/dL — ABNORMAL HIGH (ref 0.61–1.24)
GFR calc Af Amer: 48 mL/min — ABNORMAL LOW (ref >60)
GFR calc non Af Amer: 42 mL/min — ABNORMAL LOW (ref >60)
Glucose, Bld: 187 mg/dL — ABNORMAL HIGH (ref 70–99)
Potassium: 4.2 mmol/L (ref 3.5–5.1)
Sodium: 136 mmol/L (ref 135–145)

## 2020-08-07 LAB — CBC
HCT: 33.5 % — ABNORMAL LOW (ref 39.0–52.0)
Hemoglobin: 10.6 g/dL — ABNORMAL LOW (ref 13.0–17.0)
MCH: 31.3 pg (ref 26.0–34.0)
MCHC: 31.6 g/dL (ref 30.0–36.0)
MCV: 98.8 fL (ref 80.0–100.0)
Platelets: 315 10*3/uL (ref 150–400)
RBC: 3.39 MIL/uL — ABNORMAL LOW (ref 4.22–5.81)
RDW: 14.6 % (ref 11.5–15.5)
WBC: 9.8 10*3/uL (ref 4.0–10.5)
nRBC: 0 % (ref 0.0–0.2)

## 2020-08-07 LAB — GLUCOSE, CAPILLARY
Glucose-Capillary: 193 mg/dL — ABNORMAL HIGH (ref 70–99)
Glucose-Capillary: 202 mg/dL — ABNORMAL HIGH (ref 70–99)
Glucose-Capillary: 220 mg/dL — ABNORMAL HIGH (ref 70–99)
Glucose-Capillary: 166 mg/dL — ABNORMAL HIGH (ref 70–99)
Glucose-Capillary: 240 mg/dL — ABNORMAL HIGH (ref 70–99)

## 2020-08-07 MED ORDER — INSULIN ASPART 100 UNIT/ML ~~LOC~~ SOLN
0.0000 [IU] | Freq: Every day | SUBCUTANEOUS | Status: DC
  Administered 2020-08-07: 2 [IU] via SUBCUTANEOUS

## 2020-08-07 MED ORDER — INSULIN ASPART 100 UNIT/ML ~~LOC~~ SOLN
0.0000 [IU] | Freq: Three times a day (TID) | SUBCUTANEOUS | Status: DC
  Administered 2020-08-07: 1 [IU] via SUBCUTANEOUS
  Administered 2020-08-08: 3 [IU] via SUBCUTANEOUS
  Administered 2020-08-08: 1 [IU] via SUBCUTANEOUS
  Administered 2020-08-08: 3 [IU] via SUBCUTANEOUS

## 2020-08-07 MED ORDER — MELATONIN 5 MG PO TABS
5.0000 mg | ORAL_TABLET | Freq: Every day | ORAL | Status: DC
  Administered 2020-08-07: 5 mg via ORAL
  Filled 2020-08-07: qty 1

## 2020-08-07 NOTE — TOC Progression Note (Addendum)
Transition of Care [facility]) - Progression Note    Patient Details  Name: LUMAN HOLWAY MRN: [id number] Date of Birth: 10-02-1928  Transition [facility]) CM/SW [place], [place] Phone Number: 08/07/2020, 9:46 AM  Clinical Narrative:     CSW called Olivia Mackie from [employer]. Olivia Mackie agreed to review chart and will contact CSW at later time.   Olivia Mackie called CSW and discussed if bed offer possible. More info about pt TTWB status requested and pt's follow up plan (is he able to use wheelchair at home) to see if he is clinically appropriate. Olivia Mackie agreed to call pt's wife to discuss further.   CSW spoke with pt and pt's wife regarding discussions with claps. Wife has optimism about pt's ability to improve and participate in physical therapy. She confirms that pt has a wheelchair at home). CSW also provides other SNF offers to pt and wife.   Expected Discharge Plan: [place] Barriers to Discharge: Continued Medical Work up  Expected Discharge Plan and Services Expected Discharge Plan: [place] In-house Referral: Clinical Social Work Discharge Planning Services: CM Consult Post Acute Care Choice: [place] Living arrangements for the past 2 months: Single Family Home                                       Social Determinants of Health (SDOH) Interventions    Readmission Risk Interventions Readmission Risk Prevention Plan 04/21/2020  Transportation Screening Complete  PCP or Specialist Appt within 5-7 Days Complete  Home Care Screening Complete  Medication Review (RN CM) Complete  Some recent data might be hidden

## 2020-08-07 NOTE — Progress Notes (Signed)
Physical Therapy Treatment Patient Details Name: [name] Small MRN: [id number] DOB: 1928-03-29 Today's Date: 08/07/2020    History of Present Illness Pt is an 86 y/o male who sustained twisting injury with fall resulting in left leg pain and inability to bear weight. Urinary tract infection with multidrug resistant E Coli. X-rays demonstrate fracture below the plate which was used to fix his previous fracture below a hemiarthroplasty. Pt underwent surgical repair with TDWB and posterior hip precautions    PT Comments    Pt supine on arrival, drowsy initially but agreeable to therapy session with good participation and fair tolerance for session. Pt requires increased time this session to initiate and perform mobility tasks 2/2 fatigue and moderate reported LLE pain. Pt performed supine/seated LLE A/AAROM therapeutic exercises with fair tolerance as detailed below. Pt able to recall all precautions and LLE WB restriction this session. Pt continues to benefit from PT services to progress toward functional mobility goals, will plan for more OOB mobility and seated/standing balance tasks next session if more alert. Continue to recommend SNF level of rehab.  Follow Up Recommendations  SNF     Equipment Recommendations  None recommended by PT    Recommendations for Other Services       Precautions / Restrictions Precautions Precautions: Fall;Posterior Hip Restrictions Weight Bearing Restrictions: Yes LLE Weight Bearing: Touchdown weight bearing    Mobility  Bed Mobility Overal bed mobility: Needs Assistance Bed Mobility: Supine to Sit     Supine to sit: Min assist     General bed mobility comments: increased time to perform, use of bed rails on both sides of bed, compliant with precautions  Transfers Overall transfer level: Needs assistance Equipment used: Rolling walker (2 wheeled) Transfers: Sit to/from Stand Sit to Stand: Mod assist;+2 physical assistance          General transfer comment: increased assist needed from lowest bed height to RW, pt compliant with precautions/LLE advanced t/o; deferred transfer to chair due to lack of +2 staff assist (family member present but nsg assistant unavailable)  Ambulation/Gait                 Stairs             Wheelchair Mobility    Modified Rankin (Stroke Patients Only)       Balance Overall balance assessment: Needs assistance;History of Falls Sitting-balance support: Single extremity supported;Feet supported Sitting balance-Leahy Scale: Good   Postural control: Posterior lean (pt able to maintain seated posture with BUE support and cues) Standing balance support: Bilateral upper extremity supported;During functional activity Standing balance-Leahy Scale: Poor Standing balance comment: reliance on UE support with RW                            Cognition Arousal/Alertness: Awake/alert Behavior During Therapy: Anxious;WFL for tasks assessed/performed Overall Cognitive Status: Within Functional Limits for tasks assessed Area of Impairment: Memory                     Memory: Decreased short-term memory (pt recalled 3/3 precautions and TWB status)         General Comments: increased drowsiness/reluctance to mobilize but performed transfer with encouragement      Exercises General Exercises - Lower Extremity Ankle Circles/Pumps: AROM;10 reps;Supine Long Arc Quad: AROM;10 reps;Both;Seated Heel Slides: AAROM;10 reps;Both;Supine    General Comments General comments (skin integrity, edema, etc.): increased drowsiness this session (in AM and PM)  Pertinent Vitals/Pain Pain Assessment: 0-10 Pain Score: 5  Pain Location: L LE Pain Descriptors / Indicators: Grimacing;Operative site guarding Pain Intervention(s): Limited activity within patient's tolerance;Monitored during session;Repositioned    Home Living                      Prior Function             PT Goals (current goals can now be found in the care plan section) Acute Rehab PT Goals Patient Stated Goal: they want snf for rehab PT Goal Formulation: With patient/family Time For Goal Achievement: 08/16/20 Potential to Achieve Goals: Good Progress towards PT goals: Progressing toward goals    Frequency    Min 3X/week      PT Plan Current plan remains appropriate    Co-evaluation              AM-PAC PT "6 Clicks" Mobility   Outcome Measure  Help needed turning from your back to your side while in a flat bed without using bedrails?: A Little Help needed moving from lying on your back to sitting on the side of a flat bed without using bedrails?: A Little Help needed moving to and from a bed to a chair (including a wheelchair)?: A Lot Help needed standing up from a chair using your arms (e.g., wheelchair or bedside chair)?: A Lot Help needed to walk in hospital room?: Total Help needed climbing 3-5 steps with a railing? : Total 6 Click Score: 12    End of Session Equipment Utilized During Treatment: Gait belt Activity Tolerance: Patient limited by pain;Patient limited by lethargy Patient left: in bed;with call bell/phone within reach;with bed alarm set;with family/visitor present (CNA present to check blood sugar; pillow under L hip offload)   PT Visit Diagnosis: Pain;Difficulty in walking, not elsewhere classified (R26.2);Unsteadiness on feet (R26.81);Other abnormalities of gait and mobility (R26.89);History of falling (Z91.81) Pain - Right/Left: Left Pain - part of body: Leg     Time: 4854-6270 PT Time Calculation (min) (ACUTE ONLY): 19 min  Charges:  $Therapeutic Activity: 8-22 mins                     Taquilla Downum P., PTA Acute Rehabilitation Services Pager: [phone] Office: [place] 08/07/2020, 5:02 PM

## 2020-08-07 NOTE — Progress Notes (Signed)
Inpatient Diabetes Program Recommendations  AACE/ADA: New Consensus Statement on Inpatient Glycemic Control (2015)  Target Ranges:  Prepandial:   less than 140 mg/dL      Peak postprandial:   less than 180 mg/dL (1-2 hours)      Critically ill patients:  140 - 180 mg/dL   Lab Results  Component Value Date   GLUCAP 202 (H) 08/07/2020   HGBA1C 8.6 (H) 07/20/2020    Review of Glycemic Control Results for [name] Small, [name] Small (MRN [id number]) as of 08/07/2020 12:01  Ref. Range 08/06/2020 07:11 08/06/2020 12:09 08/06/2020 16:10 08/06/2020 21:03 08/06/2020 21:45 08/07/2020 04:06 08/07/2020 06:58  Glucose-Capillary Latest Ref Range: 70 - 99 mg/dL 111 (H) 242 (H) 320 (H) Novolog 11units  61 (L) 94 193 (H) 202 (H)   Diabetes history:  DM2 Outpatient Diabetes medications:  Lantus 5 units daily Current orders for Inpatient glycemic control:  Lantus 10 units daily  Inpatient Diabetes Program Recommendations:     Noted 61 mg/dL at 21:03 last evening after receiving correction for 320 mg/dL.  Might consider,  1-Novolog 0-9 units tid with meals to avoid over correction and 2-Novolog 3 units with meals if eats at least 50% as postprandials are elevated.    Will continue to follow while inpatient.  Thank you, Reche Dixon, RN, BSN Diabetes Coordinator Inpatient Diabetes Program [phone] (team pager from 8a-5p)

## 2020-08-07 NOTE — Discharge Instructions (Signed)
Urology:  Resume clean intermittent catheterizations as discussed with Dr. Louis Meckel at the last office visit (ie. Twice daily - before bed and in the morning).  Continue doxycycline for 10 days.  [facility] for worsening symptoms or difficulty with catheterization.   Orthopaedic Discharge Instructions             Touchdown weightbearing Left Leg  ROM as tolerated L knee Posterior hip precautions L hip (s/p THA) daily dressing changes L thigh. See below.  Once wounds are dry they can be left open to the air  Ice PRN for swelling and pain   DISCHARGE WOUND CARE INSTRUCTIONS   Discharge Pin Site Instructions  Dress pins daily with Kerlix roll starting on POD 2. Wrap the Kerlix so that it tamps the skin down around the pin-skin interface to prevent/limit motion of the skin relative to the pin.  (Pin-skin motion is the primary cause of pain and infection related to external fixator pin sites).  Remove any crust or coagulum that may obstruct drainage with a saline moistened gauze or soap and water.  After POD 3, if there is no discernable drainage on the pin site dressing, the interval for change can by increased to every other day.  You may shower with the fixator, cleaning all pin sites gently with soap and water.  If you have a surgical wound this needs to be completely dry and without drainage before showering.  The extremity can be lifted by the fixator to facilitate wound care and transfers.  Notify the office/Doctor if you experience increasing drainage, redness, or pain from a pin site, or if you notice purulent (thick, snot-like) drainage.  Discharge Wound Care Instructions  Do NOT apply any ointments, solutions or lotions to pin sites or surgical wounds.  These prevent needed drainage and even though solutions like hydrogen peroxide kill bacteria, they also damage cells lining the pin sites that help fight infection.   Applying lotions or ointments can keep the wounds moist and can cause them to breakdown and open up as well. This can increase the risk for infection. When in doubt call the office.  Surgical incisions should be dressed daily starting now  If any drainage is noted, use one layer of adaptic, then gauze and tape. Alternatively you can use a mepilex type dressing   Once the incision is completely dry and without drainage, it may be left open to air out.  Showering may begin 36-48 hours later.  Cleaning gently with soap and water.   CALL OFFICE WITH QUESTIONS OR CONCERNS [phone]

## 2020-08-07 NOTE — Progress Notes (Addendum)
PROGRESS NOTE    [name] Small  BJS:[id number] DOB: Mar 28, 1928 DOA: 07/31/2020 PCP: Tonia Ghent, MD   Brief Narrative:  This is an 86 year old male with past medical history of HFpEF, CAD, CKD 3, COPD, recurrent UTIs, left femur fracture repair in June, diabetes who presented to the ED with a chief complaint of a fall and left leg pain.  He had been feeling unwell lately with increased urinary urgency over the weekend.  He had a fall on Saturday with subsequent left hip pain but did not come straight to the ED.  Today his occupational therapist was evaluating him and recommended that he come to the ED.  He has had significant pain in his left leg since.  No loss of consciousness, no head trauma.  He is not on any blood thinners.  No other pain. Of note he was found to have a multidrug-resistant E. coli UTI on 9/1 and was prescribed Keflex and found to have recurrent UTI recently at the urology office and was supposed to start Keflex today.  He does admit to urinary urgency and occasionally urinary retention. In ED: afebrile hemodynamically stable on room air.  Notable labs: Glucose 150, creatinine 1.94 (baseline 1.25 in July), WBC 12.1.  Left femur x-ray: New mildly angulated oblique fracture involving the distal left femoral shaft around the 2 most distal screws of the previously placed internal fixation plate.  ED physician discussed with Dr. [name], orthopedics, who recommended admission/transfer to [facility].  Patient met as above with mechanical fall with left leg pain in the same region as previous fracture.  Patient evaluated by orthopedic surgery, now status post ORIF tolerated procedure well working with physical therapy but ultimately will need disposition to skilled nursing facility for ongoing PT and close monitoring with elevated fall risk.  Patient clinically and medically stable for discharge at this time, awaiting bed placement and approval with insurance at this  time.  Assessment & Plan:   Principal Problem:   Femur fracture, left (HCC) Active Problems:   Type 2 diabetes mellitus with diabetic polyneuropathy (HCC)   BPH (benign prostatic hyperplasia)   Acute kidney injury ([place])   Periprosthetic hip fracture   Recurrent UTI   Pressure injury of skin  Left femur fracture s/p mechanical fall with history of recent left femur fracture s/p ORIF by Dr. [name] previously on 04/19/2020 Orthopedic surgery consulted Status post ORIF 9/21; tolerated procedure quite well Pain management/DVT ppx per ortho PT/OT to follow -currently recommending SNF Decrease melatonin to 58m - sleeping late into the morning on higher dose which is unlike him per wife at bedside.  Recurrent UTI with history of multidrug-resistant E. coli UTI, POA Urology recommending doxycycline for 10 days given cultures from outpatient urine collection  Suspected AKI vs worsening CKD 3b, improving Creatinine 1.25 in July Unclear if worsening baseline function vs more acute process Worsening despite LR bolus on the 22nd, continue LR at 50 cc/h x1L Encourage increased p.o. intake Lab Results  Component Value Date   CREATININE 1.72 (H) 08/04/2020   CREATININE 2.55 (H) 08/02/2020   CREATININE 1.99 (H) 07/31/2020   Profound electrolyte abnormalities in the setting of fluid restriction - Patient has moderate hyponatremia, hyperkalemia, hypochloremia and hyperglycemia with worsening creatinine as above -Continue IV fluids, increase p.o. intake, Lokelma x1, insulin as below  Intermittent urinary retention with history of enlarged prostate  Follows with urology Straight caths at home Continue tamsulosin Every 4 hour bladder scans with in/out cath if  greater than 400 cc urine noted  Generalized weakness, likely multifactorial: UTI, debility PT following appreciate insight and recommendations, likely requiring SNF  COPD, not in exacerbation Continue home Spiriva with Breo  HFpEF,  not in exacerbation Daily weights and intake/output  Uncontrolled insulin-dependent diabetes mellitus type 2  Increase Lantus to 10 units twice daily Initiate sliding scale insulin -decreased to sensitive sliding scale Follow closely for hypoglycemia given labile glucose over the past 24 hours with poor p.o. intake Lab Results  Component Value Date   HGBA1C 8.6 (H) 07/20/2020   Stage II, sacral decubitus ulcer, POA -Continue bedside wound care  DVT prophylaxis: Heparin Code Status: DNR Family Communication: Wife at bedside  Status is: Inpatient  Dispo: The patient is from: Home              Anticipated d/c is to: SNF              Anticipated d/c date is: 24-48 hours pending clinical course/bed availability              Patient currently not medically stable for discharge due to ongoing need for further surgical evaluation in the setting of fracture and mechanical fall.  Consultants:   Ortho  Procedures:   ORIF 08/01/2020 per orthopedics  Antimicrobials:  Perioperatively per orthopedics  Subjective: No acute issues or events overnight, slept much better than previous, pain still well controlled denies nausea, vomiting, diarrhea, headache, fevers, chills.  Minimal constipation symptoms as discussed above.  Objective: Vitals:   08/06/20 1938 08/07/20 0341 08/07/20 0741 08/07/20 0909  BP: (!) 105/47 (!) 140/57 (!) 125/54   Pulse: 65 63 (!) 59 64  Resp: 16 16 15 18   Temp: 98.6 F (37 C) 98.4 F (36.9 C) 98.3 F (36.8 C)   TempSrc: Oral Oral Oral   SpO2: 94% 97% 97%   Weight:      Height:        Intake/Output Summary (Last 24 hours) at 08/07/2020 1423 Last data filed at 08/07/2020 1300 Gross per 24 hour  Intake 480 ml  Output 1425 ml  Net -945 ml   Filed Weights   08/01/20 1330 08/04/20 2100 08/05/20 0823  Weight: 95.3 kg 95.9 kg 98.1 kg    Examination:  General:  Pleasantly resting in bed, No acute distress. HEENT:  Normocephalic atraumatic.  Sclerae  nonicteric, noninjected.  Extraocular movements intact bilaterally. Neck:  Without mass or deformity.  Trachea is midline. Lungs:  Clear to auscultate bilaterally without rhonchi, wheeze, or rales. Heart:  Regular rate and rhythm.  Without murmurs, rubs, or gallops. Abdomen:  Soft, nontender, nondistended.  Without guarding or rebound. Extremities: Without cyanosis, clubbing, edema, or obvious deformity, left hip bandage clean dry intact. Vascular:  Dorsalis pedis and posterior tibial pulses palpable bilaterally. Skin:  Warm and dry, no erythema, no ulcerations; left hip bandage clean dry intact.  Data Reviewed: I have personally reviewed following labs and imaging studies  CBC: Recent Labs  Lab 07/31/20 1659 08/02/20 0952 08/04/20 0419  WBC 12.6* 10.5 10.2  HGB 12.3* 11.1* 10.3*  HCT 39.1 35.6* 32.9*  MCV 99.0 100.8* 97.6  PLT 286 314 440   Basic Metabolic Panel: Recent Labs  Lab 07/31/20 1659 08/02/20 0952 08/04/20 0419  NA  --  130* 133*  K  --  5.6* 4.5  CL  --  96* 96*  CO2  --  21* 28  GLUCOSE  --  510* 332*  BUN  --  60* 48*  CREATININE 1.99* 2.55* 1.72*  CALCIUM  --  7.7* 8.0*   GFR: Estimated Creatinine Clearance: 33.9 mL/min (A) (by C-G formula based on SCr of 1.72 mg/dL (H)). Liver Function Tests: No results for input(s): AST, ALT, ALKPHOS, BILITOT, PROT, ALBUMIN in the last 168 hours. No results for input(s): LIPASE, AMYLASE in the last 168 hours. No results for input(s): AMMONIA in the last 168 hours. Coagulation Profile: No results for input(s): INR, PROTIME in the last 168 hours. Cardiac Enzymes: No results for input(s): CKTOTAL, CKMB, CKMBINDEX, TROPONINI in the last 168 hours. BNP (last 3 results) No results for input(s): PROBNP in the last 8760 hours. HbA1C: No results for input(s): HGBA1C in the last 72 hours. CBG: Recent Labs  Lab 08/06/20 2103 08/06/20 2145 08/07/20 0406 08/07/20 0658 08/07/20 1202  GLUCAP 61* 94 193* 202* 220*    Lipid Profile: No results for input(s): CHOL, HDL, LDLCALC, TRIG, CHOLHDL, LDLDIRECT in the last 72 hours. Thyroid Function Tests: No results for input(s): TSH, T4TOTAL, FREET4, T3FREE, THYROIDAB in the last 72 hours. Anemia Panel: No results for input(s): VITAMINB12, FOLATE, FERRITIN, TIBC, IRON, RETICCTPCT in the last 72 hours. Sepsis Labs: No results for input(s): PROCALCITON, LATICACIDVEN in the last 168 hours.  Recent Results (from the past 240 hour(s))  SARS Coronavirus 2 by RT PCR (hospital order, performed in [facility] hospital lab) Nasopharyngeal Nasopharyngeal Swab     Status: None   Collection Time: 07/31/20  2:20 PM   Specimen: Nasopharyngeal Swab  Result Value Ref Range Status   SARS Coronavirus 2 NEGATIVE NEGATIVE Final    Comment: (NOTE) SARS-CoV-2 target nucleic acids are NOT DETECTED.  The SARS-CoV-2 RNA is generally detectable in upper and lower respiratory specimens during the acute phase of infection. The lowest concentration of SARS-CoV-2 viral copies this assay can detect is 250 copies / mL. A negative result does not preclude SARS-CoV-2 infection and should not be used as the sole basis for treatment or other patient management decisions.  A negative result may occur with improper specimen collection / handling, submission of specimen other than nasopharyngeal swab, presence of viral mutation(s) within the areas targeted by this assay, and inadequate number of viral copies (<250 copies / mL). A negative result must be combined with clinical observations, patient history, and epidemiological information.  Fact Sheet for Patients:   StrictlyIdeas.no  Fact Sheet for Healthcare Providers: BankingDealers.co.za  This test is not yet approved or  cleared by the [place] FDA and has been authorized for detection and/or diagnosis of SARS-CoV-2 by FDA under an Emergency Use Authorization (EUA).  This EUA will  remain in effect (meaning this test can be used) for the duration of the COVID-19 declaration under Section 564(b)(1) of the Act, 21 U.S.C. section 360bbb-3(b)(1), unless the authorization is terminated or revoked sooner.  Performed at [facility], [place] [address], [place], [place] [address]   Surgical PCR screen     Status: None   Collection Time: 07/31/20  4:05 PM   Specimen: Nasal Mucosa; Nasal Swab  Result Value Ref Range Status   MRSA, PCR NEGATIVE NEGATIVE Final   Staphylococcus aureus NEGATIVE NEGATIVE Final    Comment: (NOTE) The Xpert SA Assay (FDA approved for NASAL specimens in patients 55 years of age and older), is one component of a comprehensive surveillance program. It is not intended to diagnose infection nor to guide or monitor treatment. Performed at [facility], [place] [address] [place], [place] [address]  Surgical pcr screen     Status: None   Collection Time: 07/31/20 10:18 PM   Specimen: Nasal Mucosa; Nasal Swab  Result Value Ref Range Status   MRSA, PCR NEGATIVE NEGATIVE Final   Staphylococcus aureus NEGATIVE NEGATIVE Final    Comment: (NOTE) The Xpert SA Assay (FDA approved for NASAL specimens in patients 61 years of age and older), is one component of a comprehensive surveillance program. It is not intended to diagnose infection nor to guide or monitor treatment. Performed at [facility], [place] [address], [place], [place] [address]          Radiology Studies: No results found. Scheduled Meds: . acetaminophen  500 mg Oral Q6H  . Apremilast  30 mg Oral Daily  . atorvastatin  10 mg Oral Daily  . calcium carbonate  1 tablet Oral Q breakfast  . doxycycline  100 mg Oral Q12H  . enoxaparin (LOVENOX) injection  40 mg Subcutaneous Q24H  . escitalopram  5 mg Oral Daily  . feeding supplement (GLUCERNA SHAKE)  237 mL Oral BID BM  . fluticasone furoate-vilanterol  1 puff Inhalation Daily  . insulin aspart   0-15 Units Subcutaneous TID WC  . insulin aspart  0-5 Units Subcutaneous QHS  . insulin glargine  10 Units Subcutaneous BID  . melatonin  5 mg Oral QHS  . multivitamin with minerals  1 tablet Oral Daily  . Ensure Max Protein  11 oz Oral QHS  . tamsulosin  0.8 mg Oral QPC supper  . umeclidinium bromide  1 puff Inhalation Daily   Continuous Infusions:    LOS: 7 days   Time spent: 41mn  Garris Melhorn C Adelia Baptista, DO [facility]  If 7PM-7AM, please contact night-coverage [email or web address]  08/07/2020, 2:23 PM

## 2020-08-07 NOTE — Progress Notes (Signed)
PT Cancellation Note  Patient Details Name: [name] Small MRN: [id number] DOB: 04-Jun-1928   Cancelled Treatment:    Reason Eval/Treat Not Completed: (P) Pain limiting ability to participate;Fatigue/lethargy limiting ability to participate RN informed pt requesting more pain meds prior to therapy session, will reattempt later in day per PT POC.  Kara Pacer Edynn Gillock 08/07/2020, 11:50 AM

## 2020-08-08 DIAGNOSIS — Z96642 Presence of left artificial hip joint: Secondary | ICD-10-CM | POA: Diagnosis not present

## 2020-08-08 DIAGNOSIS — L8990 Pressure ulcer of unspecified site, unspecified stage: Secondary | ICD-10-CM | POA: Diagnosis not present

## 2020-08-08 DIAGNOSIS — N183 Chronic kidney disease, stage 3 unspecified: Secondary | ICD-10-CM | POA: Diagnosis not present

## 2020-08-08 DIAGNOSIS — I2510 Atherosclerotic heart disease of native coronary artery without angina pectoris: Secondary | ICD-10-CM | POA: Diagnosis not present

## 2020-08-08 DIAGNOSIS — M978XXD Periprosthetic fracture around other internal prosthetic joint, subsequent encounter: Secondary | ICD-10-CM | POA: Diagnosis not present

## 2020-08-08 DIAGNOSIS — R338 Other retention of urine: Secondary | ICD-10-CM | POA: Diagnosis not present

## 2020-08-08 DIAGNOSIS — J449 Chronic obstructive pulmonary disease, unspecified: Secondary | ICD-10-CM | POA: Diagnosis not present

## 2020-08-08 DIAGNOSIS — Z743 Need for continuous supervision: Secondary | ICD-10-CM | POA: Diagnosis not present

## 2020-08-08 DIAGNOSIS — E559 Vitamin D deficiency, unspecified: Secondary | ICD-10-CM | POA: Diagnosis not present

## 2020-08-08 DIAGNOSIS — M978XXA Periprosthetic fracture around other internal prosthetic joint, initial encounter: Secondary | ICD-10-CM | POA: Diagnosis not present

## 2020-08-08 DIAGNOSIS — E1142 Type 2 diabetes mellitus with diabetic polyneuropathy: Secondary | ICD-10-CM | POA: Diagnosis not present

## 2020-08-08 DIAGNOSIS — N390 Urinary tract infection, site not specified: Secondary | ICD-10-CM | POA: Diagnosis not present

## 2020-08-08 DIAGNOSIS — N179 Acute kidney failure, unspecified: Secondary | ICD-10-CM | POA: Diagnosis not present

## 2020-08-08 DIAGNOSIS — I499 Cardiac arrhythmia, unspecified: Secondary | ICD-10-CM | POA: Diagnosis not present

## 2020-08-08 DIAGNOSIS — M2550 Pain in unspecified joint: Secondary | ICD-10-CM | POA: Diagnosis not present

## 2020-08-08 DIAGNOSIS — M6281 Muscle weakness (generalized): Secondary | ICD-10-CM | POA: Diagnosis not present

## 2020-08-08 DIAGNOSIS — S728X2D Other fracture of left femur, subsequent encounter for closed fracture with routine healing: Secondary | ICD-10-CM | POA: Diagnosis not present

## 2020-08-08 DIAGNOSIS — Z7401 Bed confinement status: Secondary | ICD-10-CM | POA: Diagnosis not present

## 2020-08-08 DIAGNOSIS — E860 Dehydration: Secondary | ICD-10-CM | POA: Diagnosis not present

## 2020-08-08 DIAGNOSIS — R062 Wheezing: Secondary | ICD-10-CM | POA: Diagnosis not present

## 2020-08-08 DIAGNOSIS — K5900 Constipation, unspecified: Secondary | ICD-10-CM | POA: Diagnosis not present

## 2020-08-08 DIAGNOSIS — Z299 Encounter for prophylactic measures, unspecified: Secondary | ICD-10-CM | POA: Diagnosis not present

## 2020-08-08 DIAGNOSIS — W19XXXD Unspecified fall, subsequent encounter: Secondary | ICD-10-CM | POA: Diagnosis not present

## 2020-08-08 DIAGNOSIS — J9600 Acute respiratory failure, unspecified whether with hypoxia or hypercapnia: Secondary | ICD-10-CM | POA: Diagnosis not present

## 2020-08-08 DIAGNOSIS — Z4789 Encounter for other orthopedic aftercare: Secondary | ICD-10-CM | POA: Diagnosis not present

## 2020-08-08 DIAGNOSIS — I509 Heart failure, unspecified: Secondary | ICD-10-CM | POA: Diagnosis not present

## 2020-08-08 DIAGNOSIS — M79605 Pain in left leg: Secondary | ICD-10-CM | POA: Diagnosis not present

## 2020-08-08 DIAGNOSIS — Z515 Encounter for palliative care: Secondary | ICD-10-CM | POA: Diagnosis not present

## 2020-08-08 DIAGNOSIS — Z96649 Presence of unspecified artificial hip joint: Secondary | ICD-10-CM | POA: Diagnosis not present

## 2020-08-08 DIAGNOSIS — Z4889 Encounter for other specified surgical aftercare: Secondary | ICD-10-CM | POA: Diagnosis not present

## 2020-08-08 DIAGNOSIS — I10 Essential (primary) hypertension: Secondary | ICD-10-CM | POA: Diagnosis not present

## 2020-08-08 DIAGNOSIS — L988 Other specified disorders of the skin and subcutaneous tissue: Secondary | ICD-10-CM | POA: Diagnosis not present

## 2020-08-08 DIAGNOSIS — R0602 Shortness of breath: Secondary | ICD-10-CM | POA: Diagnosis not present

## 2020-08-08 DIAGNOSIS — Z87440 Personal history of urinary (tract) infections: Secondary | ICD-10-CM | POA: Diagnosis not present

## 2020-08-08 DIAGNOSIS — S72332D Displaced oblique fracture of shaft of left femur, subsequent encounter for closed fracture with routine healing: Secondary | ICD-10-CM | POA: Diagnosis not present

## 2020-08-08 DIAGNOSIS — S7292XD Unspecified fracture of left femur, subsequent encounter for closed fracture with routine healing: Secondary | ICD-10-CM | POA: Diagnosis not present

## 2020-08-08 LAB — GLUCOSE, CAPILLARY
Glucose-Capillary: 176 mg/dL — ABNORMAL HIGH (ref 70–99)
Glucose-Capillary: 262 mg/dL — ABNORMAL HIGH (ref 70–99)
Glucose-Capillary: 272 mg/dL — ABNORMAL HIGH (ref 70–99)

## 2020-08-08 LAB — SARS CORONAVIRUS 2 BY RT PCR (HOSPITAL ORDER, PERFORMED IN ~~LOC~~ HOSPITAL LAB): SARS Coronavirus 2: NEGATIVE

## 2020-08-08 MED ORDER — MELATONIN 5 MG PO TABS
5.0000 mg | ORAL_TABLET | Freq: Every evening | ORAL | 0 refills | Status: DC | PRN
Start: 2020-08-08 — End: 2020-09-04

## 2020-08-08 MED ORDER — VITAMIN D 125 MCG (5000 UT) PO CAPS: 1.0000 | ORAL_CAPSULE | Freq: Every day | ORAL | 3 refills | Status: DC

## 2020-08-08 MED ORDER — TRAMADOL HCL 50 MG PO TABS
50.0000 mg | ORAL_TABLET | Freq: Four times a day (QID) | ORAL | 0 refills | Status: DC | PRN
Start: 2020-08-08 — End: 2020-08-08

## 2020-08-08 MED ORDER — TRAMADOL HCL 50 MG PO TABS
50.0000 mg | ORAL_TABLET | Freq: Three times a day (TID) | ORAL | 0 refills | Status: DC | PRN
Start: 2020-08-08 — End: 2021-09-18

## 2020-08-08 MED ORDER — ENOXAPARIN SODIUM 40 MG/0.4ML ~~LOC~~ SOLN: 40.0000 mg | SUBCUTANEOUS | 0 refills | Status: DC

## 2020-08-08 MED ORDER — DOXYCYCLINE HYCLATE 100 MG PO TABS: 100.0000 mg | ORAL_TABLET | Freq: Two times a day (BID) | ORAL | 0 refills | Status: AC

## 2020-08-08 NOTE — Progress Notes (Signed)
Waiting for transportation to SNF. Report was given to Corey Skains at [facility].

## 2020-08-08 NOTE — Discharge Summary (Signed)
Physician Discharge Summary  [name] Small JQB:[id number] DOB: 1928/09/26 DOA: 07/31/2020  PCP: Tonia Ghent, MD  Admit date: 07/31/2020 Discharge date: 08/08/2020  Admitted From: Home Disposition:  SNF  Recommendations for Outpatient Follow-up:  1. Follow up with PCP in 1-2 weeks 2. Please follow up with ortho as scheduled  Discharge Condition: Stable  CODE STATUS: DNR  Diet recommendation: Diabetic diet as tolerated    Brief/Interim Summary: This is an 86 year old male with past medical history of HFpEF, CAD, CKD 3, COPD,recurrent UTIs,left femur fracture repair in June, diabetes who presented to the ED with a chief complaint of a fall and left leg pain. He had been feeling unwell lately with increased urinary urgency over the weekend. He had a fall on Saturday with subsequent left hip pain but did not come straight to the ED. Today his occupational therapist was evaluating him and recommended that he come to the ED. He has had significant pain in his left leg since. No loss of consciousness, no head trauma. He is not on any blood thinners. No other pain. Of note he was found to have a multidrug-resistant E. coli UTI on 9/1 and was prescribed Keflexand found to have recurrent UTI recently at the urology office and was supposed to start Keflex today.He does admit to urinary urgency and occasionally urinary retention. In ED: afebrile hemodynamically stable on room air. Notable labs: Glucose 150, creatinine 1.94 (baseline 1.25 in July), WBC 12.1. Left femur x-ray: New mildly angulated oblique fracture involving the distal left femoral shaft around the 2 most distal screws of the previously placed internal fixation plate. ED physician discussed with Dr. [name], orthopedics, who recommended admission/transfer to [facility].  Patient met as above with mechanical fall with left leg pain in the same region as previous fracture.  Patient evaluated by orthopedic surgery, now  status post ORIF tolerated procedure well working with physical therapy but ultimately will need disposition to skilled nursing facility for ongoing PT and close monitoring with elevated fall risk.  Patient clinically and medically stable for discharge at this time.   Discharge Diagnoses:  Principal Problem:   Femur fracture, left ([place]) Active Problems:   Type 2 diabetes mellitus with diabetic polyneuropathy (HCC)   BPH (benign prostatic hyperplasia)   Acute kidney injury ([place])   Periprosthetic hip fracture   Recurrent UTI   Pressure injury of skin    Discharge Instructions  Discharge Instructions    Call MD for:  redness, tenderness, or signs of infection (pain, swelling, redness, odor or green/yellow discharge around incision site)   Complete by: As directed    Call MD for:  severe uncontrolled pain   Complete by: As directed    Call MD for:  temperature >100.4   Complete by: As directed    Diet Carb Modified   Complete by: As directed    Discharge wound care:   Complete by: As directed    Per ortho   Increase activity slowly   Complete by: As directed      Allergies as of 08/08/2020      Reactions   Actos [pioglitazone Hydrochloride] Other (See Comments)   Held 2012 bladder cancer   Aspirin Other (See Comments)   Held 2012 due to hematuria and bruising.    Ace Inhibitors Cough   Bactrim Rash, Other (See Comments)   Rash, presumed allergy   Gabapentin Nausea And Vomiting   Upset stomach and diarrhea - tolerates low doses   Lyrica [pregabalin]  Swelling   swelling   Pravastatin Swelling   Swelling of feet   Sulfa Drugs Cross Reactors Rash      Medication List    STOP taking these medications   cephALEXin 500 MG capsule Commonly known as: KEFLEX   HYDROcodone-acetaminophen 5-325 MG tablet Commonly known as: NORCO/VICODIN     TAKE these medications   acetaminophen 325 MG tablet Commonly known as: TYLENOL Take 1-2 tablets (325-650 mg total) by mouth every  4 (four) hours as needed for mild pain.   Apremilast 30 MG Tabs Take 1 tablet by mouth daily.   atorvastatin 10 MG tablet Commonly known as: LIPITOR Take 1 tablet (10 mg total) by mouth daily.   budesonide-formoterol 160-4.5 MCG/ACT inhaler Commonly known as: SYMBICORT Inhale 2 puffs into the lungs 2 (two) times daily as needed (shortness of breath).   calcium carbonate 1250 (500 Ca) MG tablet Commonly known as: OS-CAL - dosed in mg of elemental calcium Take 1 tablet (500 mg of elemental calcium total) by mouth daily with breakfast.   clobetasol 0.05 % external solution Commonly known as: TEMOVATE Apply 1 application topically daily as needed (rash).   docusate sodium 100 MG capsule Commonly known as: COLACE Take 1 capsule (100 mg total) by mouth 3 (three) times daily. What changed:   when to take this  reasons to take this   doxycycline 100 MG tablet Commonly known as: VIBRA-TABS Take 1 tablet (100 mg total) by mouth every 12 (twelve) hours for 3 days.   escitalopram 5 MG tablet Commonly known as: Lexapro Take 1 tablet (5 mg total) by mouth daily.   insulin glargine 100 UNIT/ML Solostar Pen Commonly known as: LANTUS Inject 10 Units into the skin 2 (two) times daily.   melatonin 5 MG Tabs Take 1 tablet (5 mg total) by mouth at bedtime as needed (sleep). What changed: how much to take   methocarbamol 500 MG tablet Commonly known as: ROBAXIN Take 1 tablet (500 mg total) by mouth every 6 (six) hours as needed for muscle spasms.   multivitamin with minerals Tabs tablet Take 1 tablet by mouth daily.   Murine Ear Wax Relief Soln Place 10 drops in ear(s) once a week.   omeprazole 40 MG capsule Commonly known as: PRILOSEC Take 1 capsule (40 mg total) by mouth daily.   polyethylene glycol 17 g packet Commonly known as: MIRALAX / GLYCOLAX Take 17 g by mouth daily. What changed:   when to take this  reasons to take this   senna 8.6 MG Tabs tablet Commonly  known as: SENOKOT Take 2 tablets (17.2 mg total) by mouth at bedtime. What changed:   when to take this  reasons to take this   tamsulosin 0.4 MG Caps capsule Commonly known as: FLOMAX Take 2 capsules (0.8 mg total) by mouth daily after supper.   tiotropium 18 MCG inhalation capsule Commonly known as: SPIRIVA Place 1 capsule (18 mcg total) into inhaler and inhale daily.   traMADol 50 MG tablet Commonly known as: ULTRAM Take 1 tablet (50 mg total) by mouth every 6 (six) hours as needed for up to 7 days for moderate pain.   umeclidinium bromide 62.5 MCG/INH Aepb Commonly known as: INCRUSE ELLIPTA Inhale 1 puff into the lungs daily.            Discharge Care Instructions  (From admission, onward)         Start     Ordered   08/08/20 0000  Discharge wound  care:       Comments: Per ortho   08/08/20 1007          Contact information for follow-up providers    [facility] In 2 weeks.   Contact information: [place]       Altamese Moorpark, MD. Schedule an appointment as soon as possible for a visit in 10 day(s).   Specialty: Orthopedic Surgery Contact information: [place] [address] [phone]            Contact information for after-discharge care    Destination    [facility]erred [facility] .   Service: Skilled Nursing Contact information: [place] [phone]                 Allergies  Allergen Reactions  . Actos [Pioglitazone Hydrochloride] Other (See Comments)    Held 2012 bladder cancer  . Aspirin Other (See Comments)    Held 2012 due to hematuria and bruising.   . Ace Inhibitors Cough  . Bactrim Rash and Other (See Comments)    Rash, presumed allergy  . Gabapentin Nausea And Vomiting    Upset stomach and diarrhea - tolerates low doses  . Lyrica [Pregabalin] Swelling     swelling  . Pravastatin Swelling    Swelling of feet  . Sulfa Drugs Cross Reactors Rash    Consultations:  Ortho   Procedures/Studies: DG Pelvis 1-2 Views  Result Date: 07/31/2020 CLINICAL DATA:  Pelvic pain after fall. EXAM: PELVIS - 1-2 VIEW COMPARISON:  April 18, 2020. FINDINGS: Status post left total hip arthroplasty and surgical internal fixation of old proximal left femoral shaft fracture. No acute abnormality seen involving the right hip or pelvis. IMPRESSION: No acute abnormality seen in the pelvis. Electronically Signed   By: Marijo Conception M.D.   On: 07/31/2020 12:56   DG Tibia/Fibula Left  Result Date: 07/31/2020 CLINICAL DATA:  Left leg pain after fall. EXAM: LEFT TIBIA AND FIBULA - 2 VIEW COMPARISON:  None. FINDINGS: There is no evidence of fracture or other focal bone lesions. Soft tissues are unremarkable. IMPRESSION: Negative. Electronically Signed   By: Marijo Conception M.D.   On: 07/31/2020 12:51   DG Knee Complete 4 Views Left  Result Date: 07/31/2020 CLINICAL DATA:  Left knee pain after fall. EXAM: LEFT KNEE - COMPLETE 4+ VIEW COMPARISON:  None. FINDINGS: No evidence of fracture, dislocation, or joint effusion. No evidence of arthropathy or other focal bone abnormality. Soft tissues are unremarkable. IMPRESSION: Negative. Electronically Signed   By: Marijo Conception M.D.   On: 07/31/2020 12:52   DG C-Arm 1-60 Min  Result Date: 08/01/2020 CLINICAL DATA:  Retrograde IM nail of left femur. EXAM: DG C-ARM 1-60 MIN; LEFT FEMUR 2 VIEWS FLUOROSCOPY TIME:  Fluoroscopy Time:  1 minutes 27 seconds Radiation Exposure Index (if provided by the fluoroscopic device): 8.80 mGy Number of Acquired Spot Images: 5 COMPARISON:  Preoperative radiograph yesterday FINDINGS: Five fluoroscopic spot images obtained in the operating room in frontal and lateral projections. Intramedullary nail with distal and proximal locking screws traverse periprosthetic femoral shaft fracture. Prior lateral plate  and screw fixation and hip arthroplasty partially included. IMPRESSION: Fluoroscopic spot images during intramedullary nail placement for periprosthetic femoral shaft fracture. Electronically Signed   By: Keith Rake M.D.   On: 08/01/2020 18:14   DG FEMUR MIN 2 VIEWS LEFT  Result Date: 08/01/2020 CLINICAL DATA:  Retrograde IM nail of left femur. EXAM: DG C-ARM 1-60 MIN; LEFT FEMUR 2 VIEWS FLUOROSCOPY TIME:  Fluoroscopy Time:  1 minutes 27 seconds Radiation Exposure Index (if provided by the fluoroscopic device): 8.80 mGy Number of Acquired Spot Images: 5 COMPARISON:  Preoperative radiograph yesterday FINDINGS: Five fluoroscopic spot images obtained in the operating room in frontal and lateral projections. Intramedullary nail with distal and proximal locking screws traverse periprosthetic femoral shaft fracture. Prior lateral plate and screw fixation and hip arthroplasty partially included. IMPRESSION: Fluoroscopic spot images during intramedullary nail placement for periprosthetic femoral shaft fracture. Electronically Signed   By: Keith Rake M.D.   On: 08/01/2020 18:14   DG FEMUR PORT MIN 2 VIEWS LEFT  Result Date: 08/01/2020 CLINICAL DATA:  Postop. EXAM: LEFT FEMUR PORTABLE 2 VIEWS COMPARISON:  08/01/2020 FINDINGS: The patient has undergone distal intramedullary nail placement through the femur. Again noted is a fracture through the distal femur. The fracture remains displaced. The patient has undergone total hip arthroplasty on the left. The hardware appears grossly intact. The patient is status post prior plate and screw fixation of the proximal femoral diaphysis. The hardware appears relatively similar to prior study although the inferior-most screws have been transected or removed. There are expected postsurgical changes including subcutaneous gas and overlying soft tissue edema. IMPRESSION: Status post ORIF as detailed above. Electronically Signed   By: Constance Holster M.D.   On:  08/01/2020 18:32   DG Femur Portable Min 2 Views Left  Result Date: 07/31/2020 CLINICAL DATA:  Left leg pain after fall EXAM: LEFT FEMUR PORTABLE 2 VIEWS COMPARISON:  May 11, 2020. FINDINGS: Status post left total hip arthroplasty and surgical internal fixation of old proximal left femoral shaft fracture. There is now noted new mildly angulated oblique fracture involving the distal femoral shaft around the 2 most distal screws of the fixation plate. IMPRESSION: New mildly angulated oblique fracture involving the distal left femoral shaft around the 2 most distal screws of the previously placed internal fixation plate. Electronically Signed   By: Marijo Conception M.D.   On: 07/31/2020 12:54     Subjective: No acute issues/events overnight - slept well, denies nausea, vomiting, headache, fevers, chills. Pain well controlled.   Discharge Exam: Vitals:   08/08/20 0100 08/08/20 0739  BP: 115/60 (!) 142/63  Pulse: 63 (!) 58  Resp: 17 16  Temp: 98 F (36.7 C) 97.7 F (36.5 C)  SpO2: 97% 98%   Vitals:   08/07/20 1950 08/07/20 2300 08/08/20 0100 08/08/20 0739  BP: (!) 149/64  115/60 (!) 142/63  Pulse: 66  63 (!) 58  Resp: 16  17 16   Temp: 97.9 F (36.6 C)  98 F (36.7 C) 97.7 F (36.5 C)  TempSrc: Oral  Oral Oral  SpO2: 100% 97% 97% 98%  Weight:      Height:        General: Pt is alert, awake, not in acute distress Cardiovascular: RRR, S1/S2 +, no rubs, no gallops Respiratory: CTA bilaterally, no wheezing, no rhonchi Abdominal: Soft, NT, ND, bowel sounds + Extremities: no edema, no cyanosis    The results of significant diagnostics from this hospitalization (including imaging, microbiology, ancillary and laboratory) are listed below for reference.     Microbiology: Recent Results (from the past 240 hour(s))  SARS Coronavirus 2 by RT PCR (hospital order, performed in [facility] hospital lab) Nasopharyngeal Nasopharyngeal Swab     Status: None   Collection Time:  07/31/20  2:20  PM   Specimen: Nasopharyngeal Swab  Result Value Ref Range Status   SARS Coronavirus 2 NEGATIVE NEGATIVE Final    Comment: (NOTE) SARS-CoV-2 target nucleic acids are NOT DETECTED.  The SARS-CoV-2 RNA is generally detectable in upper and lower respiratory specimens during the acute phase of infection. The lowest concentration of SARS-CoV-2 viral copies this assay can detect is 250 copies / mL. A negative result does not preclude SARS-CoV-2 infection and should not be used as the sole basis for treatment or other patient management decisions.  A negative result may occur with improper specimen collection / handling, submission of specimen other than nasopharyngeal swab, presence of viral mutation(s) within the areas targeted by this assay, and inadequate number of viral copies (<250 copies / mL). A negative result must be combined with clinical observations, patient history, and epidemiological information.  Fact Sheet for Patients:   StrictlyIdeas.no  Fact Sheet for Healthcare Providers: BankingDealers.co.za  This test is not yet approved or  cleared by the [place] FDA and has been authorized for detection and/or diagnosis of SARS-CoV-2 by FDA under an Emergency Use Authorization (EUA).  This EUA will remain in effect (meaning this test can be used) for the duration of the COVID-19 declaration under Section 564(b)(1) of the Act, 21 U.S.C. section 360bbb-3(b)(1), unless the authorization is terminated or revoked sooner.  Performed at [facility], [place] [address], [place], [place] [address]   Surgical PCR screen     Status: None   Collection Time: 07/31/20  4:05 PM   Specimen: Nasal Mucosa; Nasal Swab  Result Value Ref Range Status   MRSA, PCR NEGATIVE NEGATIVE Final   Staphylococcus aureus NEGATIVE NEGATIVE Final    Comment: (NOTE) The Xpert SA Assay (FDA approved for NASAL specimens in patients 29 years  of age and older), is one component of a comprehensive surveillance program. It is not intended to diagnose infection nor to guide or monitor treatment. Performed at [facility], [place] [address], [place], [place] [address]   Surgical pcr screen     Status: None   Collection Time: 07/31/20 10:18 PM   Specimen: Nasal Mucosa; Nasal Swab  Result Value Ref Range Status   MRSA, PCR NEGATIVE NEGATIVE Final   Staphylococcus aureus NEGATIVE NEGATIVE Final    Comment: (NOTE) The Xpert SA Assay (FDA approved for NASAL specimens in patients 10 years of age and older), is one component of a comprehensive surveillance program. It is not intended to diagnose infection nor to guide or monitor treatment. Performed at [facility], [place] [address], [place],  [address]      Labs: BNP (last 3 results) No results for input(s): BNP in the last 8760 hours. Basic Metabolic Panel: Recent Labs  Lab 08/02/20 0952 08/04/20 0419 08/07/20 1509  NA 130* 133* 136  K 5.6* 4.5 4.2  CL 96* 96* 98  CO2 21* 28 30  GLUCOSE 510* 332* 187*  BUN 60* 48* 32*  CREATININE 2.55* 1.72* 1.47*  CALCIUM 7.7* 8.0* 8.4*   Liver Function Tests: No results for input(s): AST, ALT, ALKPHOS, BILITOT, PROT, ALBUMIN in the last 168 hours. No results for input(s): LIPASE, AMYLASE in the last 168 hours. No results for input(s): AMMONIA in the last 168 hours. CBC: Recent Labs  Lab 08/02/20 0952 08/04/20 0419 08/07/20 1509  WBC 10.5 10.2 9.8  HGB 11.1* 10.3* 10.6*  HCT 35.6* 32.9* 33.5*  MCV 100.8* 97.6 98.8  PLT  314 277 315   Cardiac Enzymes: No results for input(s): CKTOTAL, CKMB, CKMBINDEX, TROPONINI in the last 168 hours. BNP: Invalid input(s): POCBNP CBG: Recent Labs  Lab 08/07/20 0658 08/07/20 1202 08/07/20 1645 08/07/20 2005 08/08/20 0811  GLUCAP 202* 220* 166* 240* 176*   D-Dimer No results for input(s): DDIMER in the last 72 hours. Hgb A1c No results for input(s):  HGBA1C in the last 72 hours. Lipid Profile No results for input(s): CHOL, HDL, LDLCALC, TRIG, CHOLHDL, LDLDIRECT in the last 72 hours. Thyroid function studies No results for input(s): TSH, T4TOTAL, T3FREE, THYROIDAB in the last 72 hours.  Invalid input(s): FREET3 Anemia work up No results for input(s): VITAMINB12, FOLATE, FERRITIN, TIBC, IRON, RETICCTPCT in the last 72 hours. Urinalysis    Component Value Date/Time   COLORURINE YELLOW 07/31/2020 1659   APPEARANCEUR TURBID (A) 07/31/2020 1659   LABSPEC 1.012 07/31/2020 1659   PHURINE 6.0 07/31/2020 1659   GLUCOSEU NEGATIVE 07/31/2020 1659   GLUCOSEU 250 (A) 06/09/2020 1331   HGBUR MODERATE (A) 07/31/2020 1659   HGBUR trace-intact 11/25/2007 0937   BILIRUBINUR NEGATIVE 07/31/2020 1659   KETONESUR NEGATIVE 07/31/2020 1659   PROTEINUR 100 (A) 07/31/2020 1659   UROBILINOGEN 0.2 06/09/2020 1331   NITRITE NEGATIVE 07/31/2020 1659   LEUKOCYTESUR MODERATE (A) 07/31/2020 1659   Sepsis Labs Invalid input(s): PROCALCITONIN,  WBC,  LACTICIDVEN Microbiology Recent Results (from the past 240 hour(s))  SARS Coronavirus 2 by RT PCR (hospital order, performed in [place] hospital lab) Nasopharyngeal Nasopharyngeal Swab     Status: None   Collection Time: 07/31/20  2:20 PM   Specimen: Nasopharyngeal Swab  Result Value Ref Range Status   SARS Coronavirus 2 NEGATIVE NEGATIVE Final    Comment: (NOTE) SARS-CoV-2 target nucleic acids are NOT DETECTED.  The SARS-CoV-2 RNA is generally detectable in upper and lower respiratory specimens during the acute phase of infection. The lowest concentration of SARS-CoV-2 viral copies this assay can detect is 250 copies / mL. A negative result does not preclude SARS-CoV-2 infection and should not be used as the sole basis for treatment or other patient management decisions.  A negative result may occur with improper specimen collection / handling, submission of specimen other than nasopharyngeal swab,  presence of viral mutation(s) within the areas targeted by this assay, and inadequate number of viral copies (<250 copies / mL). A negative result must be combined with clinical observations, patient history, and epidemiological information.  Fact Sheet for Patients:   StrictlyIdeas.no  Fact Sheet for Healthcare Providers: BankingDealers.co.za  This test is not yet approved or  cleared by the [place] FDA and has been authorized for detection and/or diagnosis of SARS-CoV-2 by FDA under an Emergency Use Authorization (EUA).  This EUA will remain in effect (meaning this test can be used) for the duration of the COVID-19 declaration under Section 564(b)(1) of the Act, 21 U.S.C. section 360bbb-3(b)(1), unless the authorization is terminated or revoked sooner.  Performed at [facility], [place] [address], [place], [place] [address]   Surgical PCR screen     Status: None   Collection Time: 07/31/20  4:05 PM   Specimen: Nasal Mucosa; Nasal Swab  Result Value Ref Range Status   MRSA, PCR NEGATIVE NEGATIVE Final   Staphylococcus aureus NEGATIVE NEGATIVE Final    Comment: (NOTE) The Xpert SA Assay (FDA approved for NASAL specimens in patients 27 years of age and older), is one component of a comprehensive surveillance program. It is not intended to  diagnose infection nor to guide or monitor treatment. Performed at [facility], [place] [address], [place], [place] [address]   Surgical pcr screen     Status: None   Collection Time: 07/31/20 10:18 PM   Specimen: Nasal Mucosa; Nasal Swab  Result Value Ref Range Status   MRSA, PCR NEGATIVE NEGATIVE Final   Staphylococcus aureus NEGATIVE NEGATIVE Final    Comment: (NOTE) The Xpert SA Assay (FDA approved for NASAL specimens in patients 93 years of age and older), is one component of a comprehensive surveillance program. It is not intended to diagnose  infection nor to guide or monitor treatment. Performed at [facility], [place] [address] [place], [place] [address]      Time coordinating discharge: Over 30 minutes  SIGNED:   Little Ishikawa, DO [facility] 08/08/2020, 10:07 AM Pager   If 7PM-7AM, please contact night-coverage [email or web address]

## 2020-08-08 NOTE — Progress Notes (Signed)
Physical Therapy Treatment Patient Details Name: [name] Small MRN: [id number] DOB: June 14, 1928 Today's Date: 08/08/2020    History of Present Illness Pt is an 86 y/o male who sustained twisting injury with fall resulting in left leg pain and inability to bear weight. Urinary tract infection with multidrug resistant E Coli. X-rays demonstrate fracture below the plate which was used to fix his previous fracture below a hemiarthroplasty. Pt underwent surgical repair with TDWB and posterior hip precautions    PT Comments    Pt supine on arrival, agreeable to therapy session with good participation and tolerance for session. Pt more alert and participatory this session compared with previous session, able to stand with +30modA from elevated bed height to RW and good compliance/awareness of TWB LLE precaution and hip precautions (able to verbalize). Pt performed supine/seated/standing BLE A/AAROM therapeutic exercises as detailed below, pt with tight hamstrings bilaterally but good participation and no loss of balance while performing seated/standing dynamic activities (minA for static standing at RW and min guard during dynamic seated tasks. Pt had episode of urinary incontinence while standing (able to warn prior to episode but unable to hold >10 seconds for urinal arrival), pt needing assist for cleanup afterward. Pt continues to benefit from PT services to progress toward functional mobility goals. Continue to recommend SNF level of rehab.   Follow Up Recommendations  SNF     Equipment Recommendations  None recommended by PT    Recommendations for Other Services       Precautions / Restrictions Precautions Precautions: Fall;Posterior Hip Precaution Booklet Issued: Yes (comment) (instructions taped up in room on board) Restrictions Weight Bearing Restrictions: Yes LLE Weight Bearing: Touchdown weight bearing    Mobility  Bed Mobility Overal bed mobility: Needs Assistance Bed Mobility:  Supine to Sit     Supine to sit: Min assist     General bed mobility comments: increased time to perform, use of bed rails on both sides of bed, compliant with precautions  Transfers Overall transfer level: Needs assistance Equipment used: Rolling walker (2 wheeled) Transfers: Sit to/from Stand Sit to Stand: Mod assist;From elevated surface (bed height elevated ~1")         General transfer comment: from slightly elevated bed height to RW x2 reps, needs cues for UE placement with poor carryover/needs reinforcement for technique and safety; good compliance with TWB LLE precs; able to remain standing up to 3 minutes at a time and performed LLE AROM x10 reps while in stance at [facility]  Ambulation/Gait                 Stairs             Wheelchair Mobility    Modified Rankin (Stroke Patients Only)       Balance Overall balance assessment: Needs assistance;History of Falls Sitting-balance support: Bilateral upper extremity supported;Feet supported (RLE flat on floor, LLE advanced/not pushing through L foot) Sitting balance-Leahy Scale: Good Sitting balance - Comments: able to sit up with Supervision at EOB >10 minutes total (in between standing tasks), pt without LOB but needs BUE support for safety Postural control: Posterior lean (mild posterior lean, able to correct briefly with cues) Standing balance support: Bilateral upper extremity supported;During functional activity Standing balance-Leahy Scale: Poor Standing balance comment: reliance on UE support with RW                            Cognition Arousal/Alertness: Awake/alert Behavior During  Therapy: WFL for tasks assessed/performed;Flat affect Overall Cognitive Status: Within Functional Limits for tasks assessed Area of Impairment: Memory (also incontinent)                     Memory: Decreased short-term memory (recalled 3/3 PHPs)         General Comments: participatory as able       Exercises General Exercises - Lower Extremity Ankle Circles/Pumps: 15 reps;Both;AROM;Supine Short Arc Quad: Left;10 reps;Supine;AROM Long [employer]: Both;AROM;10 reps;Seated Heel Slides: 15 reps;AAROM;Both;Supine Hip ABduction/ADduction: 10 reps;AAROM;Both;AROM;Supine (AROM on RLE, AAROM on LLE) Straight Leg Raises: Right;AROM;10 reps;Supine Hip Flexion/Marching: AROM;Left;10 reps;Standing (BUE support of RW while performing; not on RLE 2/2 TWB LLE)    General Comments General comments (skin integrity, edema, etc.): per RN, improved appearance/no visible pressure sore between buttocks; redness noted on L interior thigh near groin, area cleaned and barrier cream applied after pt urinal used for skin protection      Pertinent Vitals/Pain Pain Assessment: No/denies pain Faces Pain Scale: No hurt Pain Intervention(s): Monitored during session    Home Living                      Prior Function            PT Goals (current goals can now be found in the care plan section) Acute Rehab PT Goals Patient Stated Goal: SNF to get stronger before going home PT Goal Formulation: With patient/family Time For Goal Achievement: 08/16/20 Potential to Achieve Goals: Good Progress towards PT goals: Progressing toward goals    Frequency    Min 3X/week      PT Plan Current plan remains appropriate    Co-evaluation              AM-PAC PT "6 Clicks" Mobility   Outcome Measure  Help needed turning from your back to your side while in a flat bed without using bedrails?: A Little Help needed moving from lying on your back to sitting on the side of a flat bed without using bedrails?: A Little Help needed moving to and from a bed to a chair (including a wheelchair)?: A Lot Help needed standing up from a chair using your arms (e.g., wheelchair or bedside chair)?: A Lot Help needed to walk in hospital room?: Total Help needed climbing 3-5 steps with a railing? : Total 6  Click Score: 12    End of Session Equipment Utilized During Treatment: Gait belt Activity Tolerance: Patient tolerated treatment well Patient left: in bed;with call bell/phone within reach;with bed alarm set;with family/visitor present (B heels floated, call bell in lap) Nurse Communication: Mobility status;Weight bearing status PT Visit Diagnosis: Pain;Difficulty in walking, not elsewhere classified (R26.2);Unsteadiness on feet (R26.81);Other abnormalities of gait and mobility (R26.89);History of falling (Z91.81)     Time: 2620-3559 PT Time Calculation (min) (ACUTE ONLY): 31 min  Charges:  $Therapeutic Exercise: 8-22 mins $Therapeutic Activity: 8-22 mins                     Davette Nugent P., PTA Acute Rehabilitation Services Pager: [phone] Office: [place] 08/08/2020, 2:41 PM

## 2020-08-08 NOTE — Progress Notes (Signed)
Nutrition Follow-up  RD working remotely.  DOCUMENTATION CODES:   Not applicable  INTERVENTION:   -Continue Glucerna Shake po BID, each supplement provides 220 kcal and 10 grams of protein -Continue Ensure Max po daily, each supplement provides 150 kcal and 30 grams of protein -Continue MVI with minerals daily  NUTRITION DIAGNOSIS:   Increased nutrient needs related to post-op healing as evidenced by estimated needs.  Ongoing  GOAL:   Patient will meet greater than or equal to 90% of their needs  Progressing  MONITOR:   Supplement acceptance, PO intake, Diet advancement, Labs, Weight trends, Skin, I & O's  REASON FOR ASSESSMENT:   Consult Assessment of nutrition requirement/status, Hip fracture protocol  ASSESSMENT:   This is an 86 year old male with past medical history of HFpEF, CAD, CKD 3, COPD, recurrent UTIs, left femur fracture repair in June, diabetes who presented to the ED with a chief complaint of a fall and left leg pain.  9/21- s/p IM retrograde femoral nailing (lt upper leg)  Reviewed I/O's: -2 L x 24 hours and -4.5 L since admission  UOP: 2 L x 24 hours  Attempted to speak with pt via call to hospital room phone, however, unable to reach.   Intake has improved since last visit. Noted meal completion 50-100%. Pt has been consuming Glucerna supplements and Ensure Max per MAR.   Medications reviewed and include calcium carbonate and melatonin.   Per TOC notes, plan to d/c to SNF today.   Labs reviewed: CBGS: 166-240 (inpatient orders for glycemic control are 0-5 units insulin aspart daily at bedtime, 0-6 units insulin aspart TID with meals, and 10 units insulin glargine BID).   Diet Order:   Diet Order            Diet Carb Modified           Diet Carb Modified Fluid consistency: Thin; Room service appropriate? Yes  Diet effective now                 EDUCATION NEEDS:   No education needs have been identified at this time  Skin:  Skin  Assessment: Skin Integrity Issues: Skin Integrity Issues:: Incisions, Stage II Stage II: rt and lt buttocks Incisions: closed lt leg  Last BM:  08/05/20  Height:   Ht Readings from Last 1 Encounters:  08/01/20 6\' 2"  (1.88 m)    Weight:   Wt Readings from Last 1 Encounters:  08/05/20 98.1 kg    Ideal Body Weight:  86.4 kg  BMI:  Body mass index is 27.77 kg/m.  Estimated Nutritional Needs:   Kcal:  2200-2400  Protein:  110-125 grams  Fluid:  > 2 L    Loistine Chance, RD, LDN, [place] Registered Dietitian II Certified Diabetes Care and Education Specialist Please refer to [facility] for RD and/or RD on-call/weekend/after hours pager

## 2020-08-08 NOTE — Progress Notes (Signed)
[facility] at bedside to pick up patient 

## 2020-08-08 NOTE — TOC Progression Note (Signed)
Transition of Care [facility]) - Progression Note    Patient Details  Name: [name] Small MRN: [id number] Date of Birth: 1928-10-19  Transition of Care [facility]) CM/SW [place], [place] Phone Number: 08/08/2020, 10:19 AM  Clinical Narrative:     Spoke with pt and spouse bedside. Spouse confirmed she spoke with [facility] and they can accept bed offer. CSW to follow up with [facility] and will coordinate transport.   CSW spoke with Olivia Mackie from [facility] and confirmed bed in Room 105. Will wait on updated covid.   CSW started insurance auth and faxed clinicls. Reference # [id number]   Expected Discharge Plan: Skilled Nursing Facility Barriers to Discharge: Continued Medical Work up  Expected Discharge Plan and Services Expected Discharge Plan: [place] In-house Referral: Clinical Social Work Discharge Planning Services: CM Consult Post Acute Care Choice: [place] Living arrangements for the past 2 months: Single Family Home Expected Discharge Date: 08/08/20                                     Social Determinants of Health (SDOH) Interventions    Readmission Risk Interventions Readmission Risk Prevention Plan 04/21/2020  Transportation Screening Complete  PCP or Specialist Appt within 5-7 Days Complete  Home Care Screening Complete  Medication Review (RN CM) Complete  Some recent data might be hidden

## 2020-08-08 NOTE — Progress Notes (Signed)
CSW received Auth from [place]. Auth approved for 08/08/20- 08/10/20. Josem Kaufmann ID is [id number]

## 2020-08-08 NOTE — Care Management Important Message (Signed)
Important Message  Patient Details  Name: [name] Small MRN: [id number] Date of Birth: 1928-05-12   Medicare Important Message Given:  Yes - Important Message mailed due to current National Emergency  Verbal consent obtained due to current National Emergency  Relationship to patient: Self Contact Name: [name] Small Call Date: 08/08/20  Time: 1135 Phone: [phone] Outcome: No Answer/Busy Important Message mailed to: Patient address on file    Delorse Lek 08/08/2020, 11:35 AM

## 2020-08-08 NOTE — TOC Progression Note (Signed)
Transition of Care [facility]) - Progression Note    Patient Details  Name: [name] Small MRN: [id number] Date of Birth: 1928-11-09  Transition of Care [facility]) CM/SW [place], [place] Phone Number: 08/08/2020, 1:17 PM  Clinical Narrative:     CSW received call from [facility]. Pt received authorization for SNF LOC at [facility]. Auth good from 08/08/20 -- 08/10/20. Auth number is [id number]. Ref# [id number]   Expected Discharge Plan: Skilled Nursing Facility Barriers to Discharge: Continued Medical Work up  Expected Discharge Plan and Services Expected Discharge Plan: [place] In-house Referral: Clinical Social Work Discharge Planning Services: CM Consult Post Acute Care Choice: [place] Living arrangements for the past 2 months: Single Family Home Expected Discharge Date: 08/08/20                                     Social Determinants of Health (SDOH) Interventions    Readmission Risk Interventions Readmission Risk Prevention Plan 04/21/2020  Transportation Screening Complete  PCP or Specialist Appt within 5-7 Days Complete  Home Care Screening Complete  Medication Review (RN CM) Complete  Some recent data might be hidden

## 2020-08-08 NOTE — TOC Progression Note (Addendum)
Transition of Care [facility]) - Progression Note    Patient Details  Name: [name] Small MRN: [id number] Date of Birth: 07/12/1928  Transition of Care [facility]) CM/SW [place], [place] Phone Number: 08/08/2020, 2:49 PM  Clinical Narrative:     Pt will discharge to [facility] via ptar. PTs and wife was notified of transfer.   Nurse to call report to [phone].  Expected Discharge Plan: [place] Barriers to Discharge: Continued Medical Work up  Expected Discharge Plan and Services Expected Discharge Plan: [place] In-house Referral: Clinical Social Work Discharge Planning Services: CM Consult Post Acute Care Choice: [place] arrangements for the past 2 months: [place] Expected Discharge Date: 08/08/20                                     Social Determinants of Health (SDOH) Interventions    Readmission Risk Interventions Readmission Risk Prevention Plan 04/21/2020  Transportation Screening Complete  PCP or Specialist Appt within 5-7 Days Complete  Home Care Screening Complete  Medication Review (RN CM) Complete  Some recent data might be hidden   [name], Latanya Presser, [place] Social Worker [phone]

## 2020-08-09 DIAGNOSIS — I10 Essential (primary) hypertension: Secondary | ICD-10-CM | POA: Diagnosis not present

## 2020-08-10 ENCOUNTER — Encounter: Payer: Self-pay | Admitting: Family Medicine

## 2020-08-10 DIAGNOSIS — J449 Chronic obstructive pulmonary disease, unspecified: Secondary | ICD-10-CM | POA: Diagnosis not present

## 2020-08-10 DIAGNOSIS — I509 Heart failure, unspecified: Secondary | ICD-10-CM | POA: Diagnosis not present

## 2020-08-10 DIAGNOSIS — J9600 Acute respiratory failure, unspecified whether with hypoxia or hypercapnia: Secondary | ICD-10-CM | POA: Diagnosis not present

## 2020-08-10 DIAGNOSIS — R062 Wheezing: Secondary | ICD-10-CM | POA: Diagnosis not present

## 2020-08-11 DIAGNOSIS — Z96649 Presence of unspecified artificial hip joint: Secondary | ICD-10-CM | POA: Diagnosis not present

## 2020-08-11 DIAGNOSIS — M978XXA Periprosthetic fracture around other internal prosthetic joint, initial encounter: Secondary | ICD-10-CM | POA: Diagnosis not present

## 2020-08-13 DIAGNOSIS — S7292XD Unspecified fracture of left femur, subsequent encounter for closed fracture with routine healing: Secondary | ICD-10-CM | POA: Diagnosis not present

## 2020-08-13 DIAGNOSIS — Z299 Encounter for prophylactic measures, unspecified: Secondary | ICD-10-CM | POA: Diagnosis not present

## 2020-08-13 NOTE — Op Note (Signed)
08/01/2020  8:28 AM  PATIENT:  [name] Small  86 y.o. male  PRE-OPERATIVE DIAGNOSIS:   1. LEFT FEMORAL SHAFT FRACTURE, OBLIQUE, PERI-IMPLANT 2. LOOSENING AND MIGRATION OF IMPLANT   POST-OPERATIVE DIAGNOSIS: 1. LEFT FEMORAL SHAFT FRACTURE, OBLIQUE, PERI-IMPLANT 2. LOOSENING AND MIGRATION OF IMPLANT   PROCEDURE:  Procedure(s): 1. INTRAMEDULLARY (IM) RETROGRADE FEMORAL NAILING (Left) WITH BIOMET [place] 12 X 260 MM STATICALLY LOCKED 2. REMOVAL OF DEEP IMPLANTS LEFT FEMUR  SURGEON:  Surgeon(s) and Role:    Altamese Stanhope, MD - Primary  PHYSICIAN ASSISTANT: NONE.  ANESTHESIA:   general  I/O:  No intake/output data recorded.  SPECIMEN:  No Specimen  TOURNIQUET:  * No tourniquets in log *  COMPLICATIONS: NONE  DICTATION: .Note written in EPIC  DISPOSITION: TO PACU  CONDITION: STABLE  DELAY START OF DVT PROPHYLAXIS BECAUSE OF BLEEDING RISK: NO  BRIEF SUMMARY OF INDICATION FOR PROCEDURE:  [name] Small is a 86 y.o. with recurrent difficulties related to multidrug resistant urinary tract infection who sustained another left femur fracture below his prior fixation in a new fall when he became weak from his infection. I recommended retrograde nailing of the femur to overlap with previously placed hardware with removal of the loose implants to facilitate early mobilization and range of motion. The risks and benefits of this operation were discussed with the patient including the possibilities of infection, nerve injury, vessel injury, DVT/ PE, loss of motion, arthritis, symptomatic hardware, and need for further surgery among others.  After full discussion, the patient and wife gave consent to proceed.  BRIEF SUMMARY OF PROCEDURE:  After administration of preoperative antibiotics, the patient was taken to the operating room where general anesthesia was induced.  Careful positioning was performed with a bump placed under the left hip, and control of the fracture with distraction  during positioning and prepping to prevent neurovascular injury. Standard prep and drape was performed with chlorhexidine scrub and wash initially, then Betadine scrub and paint.  Time-out was held and then the radiolucent triangle and towel bumps used to gain length and sagittal alignment with traction and flexion of the knee. I began with identification and removal of the three most distal screws from the previously placed plate used to repair his periprosthetic femur more proximally. The two distal screws were loose and migrated. I used a lateral incision to identify, grasp and remove the screws. I then placed two unicortical screws and locking caps into the near cortex.  A 2.5 cm incision was made at the base of the patellar tendon, extending proximally, followed with a medial parapatellar retinacular incision.  The sharp guide pin was placed directly anterior to Blumensaat's line in the middle of the condyle and advanced on AP and lateral projections into the center-center position of the distal femur. My assistant continued traction while I fine tuned the bumps and he also applied pressure with a mallet to produce coronal plane angulation.  The starting reamer was then used distally while protecting the soft tissues.  This was followed by introduction of the ball-tipped guidewire across the fracture site into the midshaft just below the remaining screw in the plate.  Nail length was measured. Sequential reaming followed while maintaining reduction, placing a 12 x 260 mm nail.  After confirming appropriate seating of the nail beyond Blumensaat's line on the lateral, locking screws were placed distally off the guide, and checked on orthogonal images to confirm placement within the nail and appropriate length. Two proximal locks were then  placed in static mode using perfect circle technique and a captured screwdriver.  These screws were also confirmed for length and position. I then irrigated all wounds  thoroughly and closed them in standard layered fashion, working simultaneously to reduce overall time in the OR. The wounds were dressed and a gently compressive wrap from the ankle to thigh was applied.  The patient was awakened and taken to the PACU in stable condition.    PROGNOSIS:  The patient will have unrestricted range of motion of the knee and hip, and early mobilization will be encouraged with nonweight bearing.  DVT prophylaxis will resume. I have contacted Dr. Doralee Albino from Urology who will provide recommendations for ongoing issues in that domain. Metabolic bone workup is ongoing. Given the patient's associated injuries and comorbidities the risk of complications remains elevated which has been discussed with the family.    [name] Small, M.D.

## 2020-08-16 DIAGNOSIS — R338 Other retention of urine: Secondary | ICD-10-CM | POA: Diagnosis not present

## 2020-08-18 ENCOUNTER — Other Ambulatory Visit: Payer: Self-pay

## 2020-08-18 ENCOUNTER — Non-Acute Institutional Stay: Payer: Medicare Other | Admitting: Hospice

## 2020-08-18 DIAGNOSIS — Z515 Encounter for palliative care: Secondary | ICD-10-CM

## 2020-08-18 NOTE — Progress Notes (Signed)
[facility] Consult Note Telephone: [phone]  Fax: [phone]  PATIENT NAME: [name] Small [place] [address] [phone] (home)  DOB: June 28, 1928 MRN: [id number]  PRIMARY CARE PROVIDER:    Tonia Ghent, MD,  [place] [address] [phone]  REFERRING PROVIDER: Martinique Blattenberger, NP  RESPONSIBLE PARTY:   Extended Emergency Contact Information Primary Emergency Contact: [name] Small Address: [address]          [place], [place] [address] Johnnette Litter of [place] Phone: [phone] Mobile Phone: [phone] Relation: Spouse Secondary Emergency Contact: [name] Small Mobile Phone: [phone] Relation: Daughter  I met face to face with patient and family in home/facility.  RECOMMENDATIONS/PLAN:    Visit at the request of [name] Blattenberger, NP for palliative consult. Visit consisted of building trust and discussions on Palliative Medicine as specialized medical care for people living with serious illness, aimed at facilitating better quality of life through symptoms relief, assisting with advance care plan and establishing goals of care.   Discussion on the difference between Palliative and Hospice care. Palliative care and hospice have similar goals of managing symptoms, promoting comfort, improving quality of life, and maintaining a person's dignity. However, palliative care may be offered during any phase of a serious illness, while hospice care is usually offered when a person is expected to live for 6 months or less.  Advance Care Planning: Our advance care planning conversation included a discussion about:    The value and importance of advance care planning  Experiences with loved ones who have been seriously ill or have died  Exploration of personal, cultural or spiritual beliefs that might influence medical decisions  Exploration of goals of care  in the event of a sudden injury or illness  Identification and preparation of a healthcare agent  Review and updating or creation of an  advance directive document  CODE STATUS: Patient is a DO NOT RESUSCITATE. Signed DNR form in facility chart; same document uploaded to Epic today.   GOALS OF CARE: Goals of care include to maximize quality of life and symptom management. NP called and spoke with spouse-[name]-who endorsed palliative services and expressed appreciation for the call and update. She affirmed that patient is a DO NOT RESUSCITATE. She shared that family has some tough decisions to make relative to patient's health and discussions on these have been ongoing between her and patient. Therapeutic listening and ample emotional support provided.  Follow up Palliative Care Visit: Palliative care will continue to follow for goals of care clarification and symptom management.   Functional decline/Symptom Management: Recent hospitalization 9/20-9/28/2021 for left leg pain following a fall.  Chart review in epic indicates x-ray results showed angulated oblique fracture involving the distal left femoral shaft around the 2 most distal screws of the previously displaced internal fixation plate.   PT OT is ongoing with Left posterior hip precaution and touch down left leg weight bearing restriction.  Pain is well managed with with tramadol, acetaminophen, Robaxin He saw urologist yesterday who advised keeping his indwelling Foley catheter for now; to return in 2 weeks.  No recent COPD exacerbation; patient is compliant with his medications and breathing treatment; on oxygen supplementation 2 L/min Patient was seen in his room and also seen cooperating with occupational therapist.  He endorsed weakness after walking with PT OT.  He denied pain/discomfort.  Encouraged pacing and rest in between activities.  Palliative will continue to monitor for symptom  management/decline and make recommendations as  needed.  Family /Caregiver/Community Supports: Patient at [facility] for rehab. Patient was living at home with his wife who is involved in his care.  I spent one hour and 46 minutes providing this initial consultation; time includes time spent with patient/family, chart review, provider coordination,  and documentation. More than 50% of the time in this consultation was spent on coordinating communication.  CHIEF COMPLAIN/HISTORY OF PRESENT ILLNESS:  [name] Small is a 86 y.o. male with multiple medical problems including left  femur fracture, history of recurrent UTI, BPH.  Palliative Care was asked to follow this patient by consultation request of [name] Blattenberger, NP to help address advance care planning and goals of care.  CODE STATUS: D  PPS: 40%  HOSPICE ELIGIBILITY/DIAGNOSIS: TBD  PAST MEDICAL HISTORY:  Past Medical History:  Diagnosis Date  . (HFpEF) heart failure with preserved ejection fraction ([facility])   . Arthritis   . Back pain    s/p lumbar injection 2014  . BENIGN PROSTATIC HYPERTROPHY 05/21/2007  . Bladder cancer ([place]) 10/2011  . CAD (coronary artery disease)    nonobstructive by cath 6/12:  mid LAD 30%, proximal obtuse marginal-2 30%, proximal RCA 20%, mid RCA 30-40%.  He had normal cardiac output and mildly elevated filling pressures but no significant pulmonary hypertension;   Echocardiogram in May 2012 demonstrated EF 50-55% and left atrial enlargement   . Cellulitis of left foot    Hospitalized in 2006  . Chronic kidney disease (CKD), stage III (moderate) 12/04/2007   FOLLOWED BY [name] Small  . Chronic pain of lower extremity   . Complication of anesthesia    1 time bladder cancer surgery- 2012 , oxygen satruratioon dropped had to stay overnight, no problems since then.  Marland Kitchen COPD (chronic obstructive pulmonary disease) ([place])   . DIABETES MELLITUS, TYPE II 05/21/2007  . DIVERTICULOSIS, COLON 05/21/2007   pt denies  . Dyspnea    with exertion, patient mointors saturation   . Fatigue AGE-RELATED  . Gross hematuria    pt denies  . HYPERTENSION 05/21/2007  . Impaired hearing BILATERAL HEARING AIDS  . On home oxygen therapy    as needed  . PAD (peripheral artery disease) ([facility])    a. 02/2016 L foot nonhealing ulcer-->Periph angio: L pop 100 w/ reconstitution via extensive vollats to the prox-mid peroneal (only patent vessel BK)-->PTA of L Pop and peroneal; b. Ongoing LE ischemia 5 & 06/2016 req amputation of toes on L foot.  . Pneumonia    Hx  of   . Psoriasis ELBOWS  . Psoriasis   . PSORIASIS, SCALP 10/25/2008    SOCIAL HX:  Social History   Tobacco Use  . Smoking status: Former Smoker    Packs/day: 2.00    Years: 22.00    Pack years: 44.00    Types: Cigarettes    Quit date: 11/11/1968    Years since quitting: 51.8  . Smokeless tobacco: Never Used  Substance Use Topics  . Alcohol use: Yes    Comment: occassional- 10/17/16- none  in 4 months- "too sick"   FAMILY HX:  Family History  Problem Relation Age of Onset  . Diabetes Mother   . Heart disease Brother   . Heart disease Brother   . Heart disease Brother   . Heart disease Brother   . Heart disease Brother   . Heart disease Brother   . Lung cancer Brother     ALLERGIES:  Allergies  Allergen Reactions  .  Actos [Pioglitazone Hydrochloride] Other (See Comments)    Held 2012 bladder cancer  . Aspirin Other (See Comments)    Held 2012 due to hematuria and bruising.   . Ace Inhibitors Cough  . Bactrim Rash and Other (See Comments)    Rash, presumed allergy  . Gabapentin Nausea And Vomiting    Upset stomach and diarrhea - tolerates low doses  . Lyrica [Pregabalin] Swelling    swelling  . Pravastatin Swelling    Swelling of feet  . Sulfa Drugs Cross Reactors Rash     PERTINENT MEDICATIONS:  Outpatient Encounter Medications as of 08/18/2020  Medication Sig  . acetaminophen (TYLENOL) 325 MG tablet Take 1-2 tablets (325-650 mg total) by mouth every 4 (four) hours as needed for mild pain.   Marland Kitchen Apremilast 30 MG TABS Take 1 tablet by mouth daily. (Patient taking differently: Take 30 mg by mouth daily. )  . atorvastatin (LIPITOR) 10 MG tablet Take 1 tablet (10 mg total) by mouth daily.  . budesonide-formoterol (SYMBICORT) 160-4.5 MCG/ACT inhaler Inhale 2 puffs into the lungs 2 (two) times daily as needed (shortness of breath).  . calcium carbonate (OS-CAL - DOSED IN MG OF ELEMENTAL CALCIUM) 1250 (500 Ca) MG tablet Take 1 tablet (500 mg of elemental calcium total) by mouth daily with breakfast. (Patient not taking: Reported on 06/09/2020)  . Cholecalciferol (VITAMIN D) 125 MCG (5000 UT) CAPS Take 1 capsule by mouth daily.  . clobetasol (TEMOVATE) 0.05 % external solution Apply 1 application topically daily as needed (rash).   Marland Kitchen docusate sodium (COLACE) 100 MG capsule Take 1 capsule (100 mg total) by mouth 3 (three) times daily. (Patient taking differently: Take 100 mg by mouth daily as needed for moderate constipation. )  . enoxaparin (LOVENOX) 40 MG/0.4ML injection Inject 0.4 mLs (40 mg total) into the skin daily for 14 days.  Marland Kitchen escitalopram (LEXAPRO) 5 MG tablet Take 1 tablet (5 mg total) by mouth daily.  . Homeopathic Products (MURINE EAR WAX RELIEF) SOLN Place 10 drops in ear(s) once a week.  . insulin glargine (LANTUS) 100 UNIT/ML Solostar Pen Inject 10 Units into the skin 2 (two) times daily.  . melatonin 5 MG TABS Take 1 tablet (5 mg total) by mouth at bedtime as needed (sleep).  . methocarbamol (ROBAXIN) 500 MG tablet Take 1 tablet (500 mg total) by mouth every 6 (six) hours as needed for muscle spasms. (Patient not taking: Reported on 06/09/2020)  . Multiple Vitamin (MULTIVITAMIN WITH MINERALS) TABS tablet Take 1 tablet by mouth daily.  Marland Kitchen omeprazole (PRILOSEC) 40 MG capsule Take 1 capsule (40 mg total) by mouth daily.  . polyethylene glycol (MIRALAX / GLYCOLAX) 17 g packet Take 17 g by mouth daily. (Patient taking differently: Take 17 g by mouth daily as needed for severe  constipation. )  . senna (SENOKOT) 8.6 MG TABS tablet Take 2 tablets (17.2 mg total) by mouth at bedtime. (Patient taking differently: Take 2 tablets by mouth at bedtime as needed for mild constipation. )  . tamsulosin (FLOMAX) 0.4 MG CAPS capsule Take 2 capsules (0.8 mg total) by mouth daily after supper.  . tiotropium (SPIRIVA) 18 MCG inhalation capsule Place 1 capsule (18 mcg total) into inhaler and inhale daily.  . traMADol (ULTRAM) 50 MG tablet Take 1 tablet (50 mg total) by mouth every 8 (eight) hours as needed for moderate pain or severe pain.  Marland Kitchen umeclidinium bromide (INCRUSE ELLIPTA) 62.5 MCG/INH AEPB Inhale 1 puff into the lungs daily. (Patient not taking:  Reported on 07/31/2020)  . [DISCONTINUED] iron polysaccharides (NIFEREX) 150 MG capsule Take 1 capsule (150 mg total) by mouth daily after supper.   No facility-administered encounter medications on file as of 08/18/2020.    PHYSICAL EXAM / ROS: Height:  6 feet  Weight:200Ibs General: In no acute distress, cooperative Cardiovascular: regular rate and rhythm, no chest pain reported Pulmonary: no cough, no shortness of breath, clear ant/post fields, normal respiratory effort on oxygen supplementation 2L/Min Abdomen: soft, non tender, positive bowel sounds in all quadrants GU: Foley cath in place, patent light yellow urine in bag Extremities: no edema, no joint deformities Skin: no rashes to exposed skin  Neurological: Weakness but otherwise non focal  Teodoro Spray, NP

## 2020-08-21 DIAGNOSIS — S72332D Displaced oblique fracture of shaft of left femur, subsequent encounter for closed fracture with routine healing: Secondary | ICD-10-CM | POA: Diagnosis not present

## 2020-08-22 DIAGNOSIS — J9600 Acute respiratory failure, unspecified whether with hypoxia or hypercapnia: Secondary | ICD-10-CM | POA: Diagnosis not present

## 2020-08-22 DIAGNOSIS — J449 Chronic obstructive pulmonary disease, unspecified: Secondary | ICD-10-CM | POA: Diagnosis not present

## 2020-08-22 DIAGNOSIS — I509 Heart failure, unspecified: Secondary | ICD-10-CM | POA: Diagnosis not present

## 2020-08-22 DIAGNOSIS — R062 Wheezing: Secondary | ICD-10-CM | POA: Diagnosis not present

## 2020-08-23 DIAGNOSIS — I10 Essential (primary) hypertension: Secondary | ICD-10-CM | POA: Diagnosis not present

## 2020-08-23 DIAGNOSIS — L988 Other specified disorders of the skin and subcutaneous tissue: Secondary | ICD-10-CM | POA: Diagnosis not present

## 2020-08-23 DIAGNOSIS — N390 Urinary tract infection, site not specified: Secondary | ICD-10-CM | POA: Diagnosis not present

## 2020-08-29 DIAGNOSIS — R3914 Feeling of incomplete bladder emptying: Secondary | ICD-10-CM | POA: Diagnosis not present

## 2020-08-29 DIAGNOSIS — Z8551 Personal history of malignant neoplasm of bladder: Secondary | ICD-10-CM | POA: Diagnosis not present

## 2020-08-31 ENCOUNTER — Telehealth: Payer: Self-pay | Admitting: Family Medicine

## 2020-08-31 DIAGNOSIS — N390 Urinary tract infection, site not specified: Secondary | ICD-10-CM | POA: Diagnosis not present

## 2020-08-31 DIAGNOSIS — L89611 Pressure ulcer of right heel, stage 1: Secondary | ICD-10-CM | POA: Diagnosis not present

## 2020-08-31 DIAGNOSIS — M9702XD Periprosthetic fracture around internal prosthetic left hip joint, subsequent encounter: Secondary | ICD-10-CM | POA: Diagnosis not present

## 2020-08-31 DIAGNOSIS — S72332D Displaced oblique fracture of shaft of left femur, subsequent encounter for closed fracture with routine healing: Secondary | ICD-10-CM | POA: Diagnosis not present

## 2020-08-31 NOTE — Telephone Encounter (Signed)
Patient's family came into office and dropped off post discharge plan of care and instructions from [facility]. Placed on cart.

## 2020-09-01 ENCOUNTER — Telehealth: Payer: Self-pay | Admitting: Family Medicine

## 2020-09-01 DIAGNOSIS — L409 Psoriasis, unspecified: Secondary | ICD-10-CM | POA: Diagnosis not present

## 2020-09-01 DIAGNOSIS — M9702XD Periprosthetic fracture around internal prosthetic left hip joint, subsequent encounter: Secondary | ICD-10-CM | POA: Diagnosis not present

## 2020-09-01 DIAGNOSIS — L89611 Pressure ulcer of right heel, stage 1: Secondary | ICD-10-CM | POA: Diagnosis not present

## 2020-09-01 DIAGNOSIS — N390 Urinary tract infection, site not specified: Secondary | ICD-10-CM | POA: Diagnosis not present

## 2020-09-01 DIAGNOSIS — S72332D Displaced oblique fracture of shaft of left femur, subsequent encounter for closed fracture with routine healing: Secondary | ICD-10-CM | POA: Diagnosis not present

## 2020-09-01 NOTE — Telephone Encounter (Signed)
Free w/[facility] called to place verbal orders as follows:  1 week 1 2 week 3  Thank you!

## 2020-09-01 NOTE — Telephone Encounter (Signed)
Free with [facility] advised.

## 2020-09-01 NOTE — Telephone Encounter (Signed)
Noted. Thanks.

## 2020-09-01 NOTE — Telephone Encounter (Signed)
Please give the order.  Thanks.   

## 2020-09-04 ENCOUNTER — Telehealth (INDEPENDENT_AMBULATORY_CARE_PROVIDER_SITE_OTHER): Payer: Medicare Other | Admitting: Family Medicine

## 2020-09-04 ENCOUNTER — Encounter: Payer: Self-pay | Admitting: Family Medicine

## 2020-09-04 ENCOUNTER — Other Ambulatory Visit: Payer: Self-pay

## 2020-09-04 ENCOUNTER — Other Ambulatory Visit: Payer: Self-pay | Admitting: *Deleted

## 2020-09-04 VITALS — BP 132/56 | HR 71 | Temp 96.9°F | Ht 74.0 in | Wt 205.0 lb

## 2020-09-04 DIAGNOSIS — G4700 Insomnia, unspecified: Secondary | ICD-10-CM

## 2020-09-04 DIAGNOSIS — J438 Other emphysema: Secondary | ICD-10-CM

## 2020-09-04 DIAGNOSIS — K2210 Ulcer of esophagus without bleeding: Secondary | ICD-10-CM

## 2020-09-04 DIAGNOSIS — N390 Urinary tract infection, site not specified: Secondary | ICD-10-CM | POA: Diagnosis not present

## 2020-09-04 DIAGNOSIS — D649 Anemia, unspecified: Secondary | ICD-10-CM

## 2020-09-04 DIAGNOSIS — M9702XD Periprosthetic fracture around internal prosthetic left hip joint, subsequent encounter: Secondary | ICD-10-CM | POA: Diagnosis not present

## 2020-09-04 DIAGNOSIS — Z96649 Presence of unspecified artificial hip joint: Secondary | ICD-10-CM

## 2020-09-04 DIAGNOSIS — L89611 Pressure ulcer of right heel, stage 1: Secondary | ICD-10-CM | POA: Diagnosis not present

## 2020-09-04 DIAGNOSIS — E1142 Type 2 diabetes mellitus with diabetic polyneuropathy: Secondary | ICD-10-CM

## 2020-09-04 DIAGNOSIS — Z8781 Personal history of (healed) traumatic fracture: Secondary | ICD-10-CM

## 2020-09-04 DIAGNOSIS — S72332D Displaced oblique fracture of shaft of left femur, subsequent encounter for closed fracture with routine healing: Secondary | ICD-10-CM | POA: Diagnosis not present

## 2020-09-04 DIAGNOSIS — Z794 Long term (current) use of insulin: Secondary | ICD-10-CM

## 2020-09-04 DIAGNOSIS — M978XXD Periprosthetic fracture around other internal prosthetic joint, subsequent encounter: Secondary | ICD-10-CM

## 2020-09-04 NOTE — Progress Notes (Signed)
Virtual visit completed through WebEx or similar program Patient location: home  Provider location: [facility] at [facility], office  Participants: Patient and me (unless stated otherwise below)  Pandemic considerations d/w pt.   Limitations and rationale for visit method d/w patient.  Patient agreed to proceed.   CC: Follow-up  HPI:  DM.  Sugar 150 this AM.  Discussed options to limit hypoglycemia risk.  I would continue lantus but stop sliding scale insulin.  He had a lower sugar last night.  Patient/wife will update me as needed regarding his sugar.  Leg fx status post inpatient treatment.. Stiff but not painful.  Walking "pretty good."  Using a walker.  He has home health coming out to the house.  He needs a handicap form filled out.  They will send it to me and I'll sign it.     CRF. Still needs O2 at baseline, esp with walking.  Compliant.  Bladder symptoms.  Intermittent cath, QID.  Foley is out.  He doesn't urinate o/w.  He is going to practice standing with a urinal, prior to cath.  On suppressive doxy, cautions d/w pt. he had urology follow-up in the meantime.  He had his flu shot at [facility] after he was discharged from the hospital.  He had blood in stool recently, spotted a small amount.  This could be from local irritation.  We talked about this with the plan to observe for now.  Patient/wife will update me if he has continued symptoms.  History of anemia noted.  He needs follow-up labs set up.  We will work on getting this set up through home health.  He has been taking tylenol PM and trazodone, and sleeping well.  Would continue both as is.  D/w pt.    I would continue with PPI as is for now.  no GERD sx.    Meds and allergies reviewed.   ROS: Per HPI unless specifically indicated in ROS section   NAD Speech wnl  A/P:  DM.  Sugar 150 this AM.  Discussed options to limit hypoglycemia risk.  I would continue lantus but stop sliding scale insulin.  He had a lower sugar  last night.  Patient/wife will update me as needed regarding his sugar.  Leg fx status post inpatient treatment.. Stiff but not painful.  Walking "pretty good."  Using a walker.  He has home health coming out to the house.  He needs a handicap form filled out.  They will send it to me and I'll sign it.     COPD/chronic respiratory failure. Still needs O2 at baseline, esp with walking.  Compliant.  Bladder symptoms.  Intermittent cath, QID.  Foley is out.  He doesn't urinate o/w.  He is going to practice standing with a urinal, prior to cath.  On suppressive doxy, cautions d/w pt. he had urology follow-up in the meantime.  He had his flu shot at [facility] after he was discharged from the hospital.  He had blood in stool recently, spotted a small amount.  This could be from local irritation.  We talked about this with the plan to observe for now.  Patient/wife will update me if he has continued symptoms.  History of anemia noted.  He needs follow-up labs set up.  We will work on getting this set up through home health.  He has been taking tylenol PM and trazodone, and sleeping well.  Would continue both as is.  D/w pt.    I would continue  with PPI as is for now.  no GERD sx.

## 2020-09-04 NOTE — Telephone Encounter (Signed)
I tried to put this in as a refill but it says that Trazodone 25 mg cannot be ordered.

## 2020-09-05 DIAGNOSIS — L89611 Pressure ulcer of right heel, stage 1: Secondary | ICD-10-CM | POA: Diagnosis not present

## 2020-09-05 DIAGNOSIS — M9702XD Periprosthetic fracture around internal prosthetic left hip joint, subsequent encounter: Secondary | ICD-10-CM | POA: Diagnosis not present

## 2020-09-05 DIAGNOSIS — S72332D Displaced oblique fracture of shaft of left femur, subsequent encounter for closed fracture with routine healing: Secondary | ICD-10-CM | POA: Diagnosis not present

## 2020-09-05 DIAGNOSIS — N390 Urinary tract infection, site not specified: Secondary | ICD-10-CM | POA: Diagnosis not present

## 2020-09-05 MED ORDER — TRAZODONE HCL 50 MG PO TABS: 25.0000 mg | ORAL_TABLET | Freq: Every evening | ORAL | 3 refills | Status: DC | PRN

## 2020-09-05 NOTE — Telephone Encounter (Signed)
Refill done.  Thanks

## 2020-09-06 DIAGNOSIS — M9702XD Periprosthetic fracture around internal prosthetic left hip joint, subsequent encounter: Secondary | ICD-10-CM | POA: Diagnosis not present

## 2020-09-06 DIAGNOSIS — N390 Urinary tract infection, site not specified: Secondary | ICD-10-CM | POA: Diagnosis not present

## 2020-09-06 DIAGNOSIS — S72332D Displaced oblique fracture of shaft of left femur, subsequent encounter for closed fracture with routine healing: Secondary | ICD-10-CM | POA: Diagnosis not present

## 2020-09-06 DIAGNOSIS — L89611 Pressure ulcer of right heel, stage 1: Secondary | ICD-10-CM | POA: Diagnosis not present

## 2020-09-07 ENCOUNTER — Encounter: Payer: Self-pay | Admitting: Family Medicine

## 2020-09-07 NOTE — Assessment & Plan Note (Signed)
COPD/chronic respiratory failure. Still needs O2 at baseline, esp with walking.  Compliant.  Continue O2 as is.

## 2020-09-07 NOTE — Assessment & Plan Note (Signed)
Leg fx status post inpatient treatment.. Stiff but not painful.  Walking "pretty good."  Using a walker.  He has home health coming out to the house.  He needs a handicap form filled out.  They will send it to me and I'll sign it.

## 2020-09-07 NOTE — Assessment & Plan Note (Signed)
He had blood in stool recently, spotted a small amount.  This could be from local irritation.  We talked about this with the plan to observe for now.  Patient/wife will update me if he has continued symptoms.  History of anemia noted.  He needs follow-up labs set up.  We will work on getting this set up through home health.

## 2020-09-07 NOTE — Assessment & Plan Note (Signed)
He has been taking tylenol PM and trazodone, and sleeping well.  Would continue both as is.  D/w pt.

## 2020-09-07 NOTE — Assessment & Plan Note (Signed)
Sugar 150 this AM.  Discussed options to limit hypoglycemia risk.  I would continue lantus but stop sliding scale insulin.  He had a lower sugar last night.  Patient/wife will update me as needed regarding his sugar.

## 2020-09-07 NOTE — Assessment & Plan Note (Signed)
I would continue with PPI as is for now.  no GERD sx.

## 2020-09-07 NOTE — Assessment & Plan Note (Signed)
Bladder symptoms.  Intermittent cath, QID.  Foley is out.  He doesn't urinate o/w.  He is going to practice standing with a urinal, prior to cath.  On suppressive doxy, cautions d/w pt. he had urology follow-up in the meantime.

## 2020-09-08 DIAGNOSIS — N390 Urinary tract infection, site not specified: Secondary | ICD-10-CM | POA: Diagnosis not present

## 2020-09-08 DIAGNOSIS — S72332D Displaced oblique fracture of shaft of left femur, subsequent encounter for closed fracture with routine healing: Secondary | ICD-10-CM | POA: Diagnosis not present

## 2020-09-08 DIAGNOSIS — M9702XD Periprosthetic fracture around internal prosthetic left hip joint, subsequent encounter: Secondary | ICD-10-CM | POA: Diagnosis not present

## 2020-09-08 DIAGNOSIS — L89611 Pressure ulcer of right heel, stage 1: Secondary | ICD-10-CM | POA: Diagnosis not present

## 2020-09-09 DIAGNOSIS — J9600 Acute respiratory failure, unspecified whether with hypoxia or hypercapnia: Secondary | ICD-10-CM | POA: Diagnosis not present

## 2020-09-09 DIAGNOSIS — R062 Wheezing: Secondary | ICD-10-CM | POA: Diagnosis not present

## 2020-09-09 DIAGNOSIS — I509 Heart failure, unspecified: Secondary | ICD-10-CM | POA: Diagnosis not present

## 2020-09-09 DIAGNOSIS — J449 Chronic obstructive pulmonary disease, unspecified: Secondary | ICD-10-CM | POA: Diagnosis not present

## 2020-09-11 DIAGNOSIS — N390 Urinary tract infection, site not specified: Secondary | ICD-10-CM | POA: Diagnosis not present

## 2020-09-11 DIAGNOSIS — Z96649 Presence of unspecified artificial hip joint: Secondary | ICD-10-CM | POA: Diagnosis not present

## 2020-09-11 DIAGNOSIS — L89611 Pressure ulcer of right heel, stage 1: Secondary | ICD-10-CM | POA: Diagnosis not present

## 2020-09-11 DIAGNOSIS — M978XXA Periprosthetic fracture around other internal prosthetic joint, initial encounter: Secondary | ICD-10-CM | POA: Diagnosis not present

## 2020-09-11 DIAGNOSIS — S72332D Displaced oblique fracture of shaft of left femur, subsequent encounter for closed fracture with routine healing: Secondary | ICD-10-CM | POA: Diagnosis not present

## 2020-09-11 DIAGNOSIS — M9702XD Periprosthetic fracture around internal prosthetic left hip joint, subsequent encounter: Secondary | ICD-10-CM | POA: Diagnosis not present

## 2020-09-12 DIAGNOSIS — S72332D Displaced oblique fracture of shaft of left femur, subsequent encounter for closed fracture with routine healing: Secondary | ICD-10-CM | POA: Diagnosis not present

## 2020-09-12 DIAGNOSIS — M9702XD Periprosthetic fracture around internal prosthetic left hip joint, subsequent encounter: Secondary | ICD-10-CM | POA: Diagnosis not present

## 2020-09-12 DIAGNOSIS — R6889 Other general symptoms and signs: Secondary | ICD-10-CM | POA: Diagnosis not present

## 2020-09-12 DIAGNOSIS — L89611 Pressure ulcer of right heel, stage 1: Secondary | ICD-10-CM | POA: Diagnosis not present

## 2020-09-12 DIAGNOSIS — N390 Urinary tract infection, site not specified: Secondary | ICD-10-CM | POA: Diagnosis not present

## 2020-09-13 DIAGNOSIS — J449 Chronic obstructive pulmonary disease, unspecified: Secondary | ICD-10-CM

## 2020-09-13 DIAGNOSIS — I130 Hypertensive heart and chronic kidney disease with heart failure and stage 1 through stage 4 chronic kidney disease, or unspecified chronic kidney disease: Secondary | ICD-10-CM

## 2020-09-13 DIAGNOSIS — Z87891 Personal history of nicotine dependence: Secondary | ICD-10-CM

## 2020-09-13 DIAGNOSIS — E46 Unspecified protein-calorie malnutrition: Secondary | ICD-10-CM

## 2020-09-13 DIAGNOSIS — Z9181 History of falling: Secondary | ICD-10-CM

## 2020-09-13 DIAGNOSIS — Z9981 Dependence on supplemental oxygen: Secondary | ICD-10-CM

## 2020-09-13 DIAGNOSIS — E1122 Type 2 diabetes mellitus with diabetic chronic kidney disease: Secondary | ICD-10-CM

## 2020-09-13 DIAGNOSIS — Z8551 Personal history of malignant neoplasm of bladder: Secondary | ICD-10-CM

## 2020-09-13 DIAGNOSIS — Z794 Long term (current) use of insulin: Secondary | ICD-10-CM

## 2020-09-13 DIAGNOSIS — I2510 Atherosclerotic heart disease of native coronary artery without angina pectoris: Secondary | ICD-10-CM

## 2020-09-13 DIAGNOSIS — E1142 Type 2 diabetes mellitus with diabetic polyneuropathy: Secondary | ICD-10-CM

## 2020-09-13 DIAGNOSIS — R159 Full incontinence of feces: Secondary | ICD-10-CM

## 2020-09-13 DIAGNOSIS — Z466 Encounter for fitting and adjustment of urinary device: Secondary | ICD-10-CM

## 2020-09-13 DIAGNOSIS — R32 Unspecified urinary incontinence: Secondary | ICD-10-CM

## 2020-09-13 DIAGNOSIS — F5104 Psychophysiologic insomnia: Secondary | ICD-10-CM

## 2020-09-13 DIAGNOSIS — N183 Chronic kidney disease, stage 3 unspecified: Secondary | ICD-10-CM

## 2020-09-13 DIAGNOSIS — Z951 Presence of aortocoronary bypass graft: Secondary | ICD-10-CM

## 2020-09-13 DIAGNOSIS — M48061 Spinal stenosis, lumbar region without neurogenic claudication: Secondary | ICD-10-CM

## 2020-09-13 DIAGNOSIS — S72332D Displaced oblique fracture of shaft of left femur, subsequent encounter for closed fracture with routine healing: Secondary | ICD-10-CM | POA: Diagnosis not present

## 2020-09-13 DIAGNOSIS — N179 Acute kidney failure, unspecified: Secondary | ICD-10-CM

## 2020-09-13 DIAGNOSIS — Z86718 Personal history of other venous thrombosis and embolism: Secondary | ICD-10-CM

## 2020-09-13 DIAGNOSIS — K5790 Diverticulosis of intestine, part unspecified, without perforation or abscess without bleeding: Secondary | ICD-10-CM

## 2020-09-13 DIAGNOSIS — D631 Anemia in chronic kidney disease: Secondary | ICD-10-CM

## 2020-09-13 DIAGNOSIS — M9702XD Periprosthetic fracture around internal prosthetic left hip joint, subsequent encounter: Secondary | ICD-10-CM | POA: Diagnosis not present

## 2020-09-13 DIAGNOSIS — B9620 Unspecified Escherichia coli [E. coli] as the cause of diseases classified elsewhere: Secondary | ICD-10-CM

## 2020-09-13 DIAGNOSIS — I5032 Chronic diastolic (congestive) heart failure: Secondary | ICD-10-CM

## 2020-09-13 DIAGNOSIS — L89611 Pressure ulcer of right heel, stage 1: Secondary | ICD-10-CM

## 2020-09-13 DIAGNOSIS — E1151 Type 2 diabetes mellitus with diabetic peripheral angiopathy without gangrene: Secondary | ICD-10-CM

## 2020-09-13 DIAGNOSIS — N390 Urinary tract infection, site not specified: Secondary | ICD-10-CM | POA: Diagnosis not present

## 2020-09-13 DIAGNOSIS — R3915 Urgency of urination: Secondary | ICD-10-CM

## 2020-09-13 DIAGNOSIS — I4891 Unspecified atrial fibrillation: Secondary | ICD-10-CM

## 2020-09-13 DIAGNOSIS — N320 Bladder-neck obstruction: Secondary | ICD-10-CM

## 2020-09-14 DIAGNOSIS — L89611 Pressure ulcer of right heel, stage 1: Secondary | ICD-10-CM | POA: Diagnosis not present

## 2020-09-14 DIAGNOSIS — N390 Urinary tract infection, site not specified: Secondary | ICD-10-CM | POA: Diagnosis not present

## 2020-09-14 DIAGNOSIS — S72332D Displaced oblique fracture of shaft of left femur, subsequent encounter for closed fracture with routine healing: Secondary | ICD-10-CM | POA: Diagnosis not present

## 2020-09-14 DIAGNOSIS — M9702XD Periprosthetic fracture around internal prosthetic left hip joint, subsequent encounter: Secondary | ICD-10-CM | POA: Diagnosis not present

## 2020-09-15 DIAGNOSIS — N390 Urinary tract infection, site not specified: Secondary | ICD-10-CM | POA: Diagnosis not present

## 2020-09-15 DIAGNOSIS — S72332D Displaced oblique fracture of shaft of left femur, subsequent encounter for closed fracture with routine healing: Secondary | ICD-10-CM | POA: Diagnosis not present

## 2020-09-15 DIAGNOSIS — M9702XD Periprosthetic fracture around internal prosthetic left hip joint, subsequent encounter: Secondary | ICD-10-CM | POA: Diagnosis not present

## 2020-09-15 DIAGNOSIS — L89611 Pressure ulcer of right heel, stage 1: Secondary | ICD-10-CM | POA: Diagnosis not present

## 2020-09-18 DIAGNOSIS — S72332D Displaced oblique fracture of shaft of left femur, subsequent encounter for closed fracture with routine healing: Secondary | ICD-10-CM | POA: Diagnosis not present

## 2020-09-18 DIAGNOSIS — N390 Urinary tract infection, site not specified: Secondary | ICD-10-CM | POA: Diagnosis not present

## 2020-09-18 DIAGNOSIS — M9702XD Periprosthetic fracture around internal prosthetic left hip joint, subsequent encounter: Secondary | ICD-10-CM | POA: Diagnosis not present

## 2020-09-18 DIAGNOSIS — L89611 Pressure ulcer of right heel, stage 1: Secondary | ICD-10-CM | POA: Diagnosis not present

## 2020-09-19 DIAGNOSIS — M9702XD Periprosthetic fracture around internal prosthetic left hip joint, subsequent encounter: Secondary | ICD-10-CM | POA: Diagnosis not present

## 2020-09-19 DIAGNOSIS — L89611 Pressure ulcer of right heel, stage 1: Secondary | ICD-10-CM | POA: Diagnosis not present

## 2020-09-19 DIAGNOSIS — S72332D Displaced oblique fracture of shaft of left femur, subsequent encounter for closed fracture with routine healing: Secondary | ICD-10-CM | POA: Diagnosis not present

## 2020-09-19 DIAGNOSIS — N390 Urinary tract infection, site not specified: Secondary | ICD-10-CM | POA: Diagnosis not present

## 2020-09-20 DIAGNOSIS — S72332D Displaced oblique fracture of shaft of left femur, subsequent encounter for closed fracture with routine healing: Secondary | ICD-10-CM | POA: Diagnosis not present

## 2020-09-20 DIAGNOSIS — R339 Retention of urine, unspecified: Secondary | ICD-10-CM | POA: Diagnosis not present

## 2020-09-20 DIAGNOSIS — L89611 Pressure ulcer of right heel, stage 1: Secondary | ICD-10-CM | POA: Diagnosis not present

## 2020-09-20 DIAGNOSIS — M9702XD Periprosthetic fracture around internal prosthetic left hip joint, subsequent encounter: Secondary | ICD-10-CM | POA: Diagnosis not present

## 2020-09-20 DIAGNOSIS — N390 Urinary tract infection, site not specified: Secondary | ICD-10-CM | POA: Diagnosis not present

## 2020-09-21 ENCOUNTER — Telehealth: Payer: Self-pay | Admitting: Family Medicine

## 2020-09-21 DIAGNOSIS — M9702XD Periprosthetic fracture around internal prosthetic left hip joint, subsequent encounter: Secondary | ICD-10-CM | POA: Diagnosis not present

## 2020-09-21 DIAGNOSIS — S72332D Displaced oblique fracture of shaft of left femur, subsequent encounter for closed fracture with routine healing: Secondary | ICD-10-CM | POA: Diagnosis not present

## 2020-09-21 DIAGNOSIS — L89611 Pressure ulcer of right heel, stage 1: Secondary | ICD-10-CM | POA: Diagnosis not present

## 2020-09-21 DIAGNOSIS — N390 Urinary tract infection, site not specified: Secondary | ICD-10-CM | POA: Diagnosis not present

## 2020-09-21 NOTE — Chronic Care Management (AMB) (Signed)
  Chronic Care Management   Note  09/21/2020 Name: DUANNE DUCHESNE MRN: [id number] DOB: [birth year]  NICKLAS MCSWEENEY is a 86 y.o. year old male who is a primary care patient of Tonia Ghent, MD. I reached out to [name] by phone today in response to a referral sent by Mr. Antonieta Loveless Wrage's PCP, Tonia Ghent, MD.   Mr. Amor was given information about Chronic Care Management services today including:  1. CCM service includes personalized support from designated clinical staff supervised by his physician, including individualized plan of care and coordination with other care providers 2. 24/7 contact phone numbers for assistance for urgent and routine care needs. 3. Service will only be billed when office clinical staff spend 20 minutes or more in a month to coordinate care. 4. Only one practitioner may furnish and bill the service in a calendar month. 5. The patient may stop CCM services at any time (effective at the end of the month) by phone call to the office staff.   [name] verbally agreed to assistance and services provided by embedded care coordination/care management team today.  Follow up plan:   [place]

## 2020-09-22 DIAGNOSIS — I509 Heart failure, unspecified: Secondary | ICD-10-CM | POA: Diagnosis not present

## 2020-09-22 DIAGNOSIS — J449 Chronic obstructive pulmonary disease, unspecified: Secondary | ICD-10-CM | POA: Diagnosis not present

## 2020-09-22 DIAGNOSIS — R062 Wheezing: Secondary | ICD-10-CM | POA: Diagnosis not present

## 2020-09-22 DIAGNOSIS — J9600 Acute respiratory failure, unspecified whether with hypoxia or hypercapnia: Secondary | ICD-10-CM | POA: Diagnosis not present

## 2020-09-23 ENCOUNTER — Encounter: Payer: Self-pay | Admitting: Family Medicine

## 2020-09-26 DIAGNOSIS — M9702XD Periprosthetic fracture around internal prosthetic left hip joint, subsequent encounter: Secondary | ICD-10-CM | POA: Diagnosis not present

## 2020-09-26 DIAGNOSIS — L89611 Pressure ulcer of right heel, stage 1: Secondary | ICD-10-CM | POA: Diagnosis not present

## 2020-09-26 DIAGNOSIS — N390 Urinary tract infection, site not specified: Secondary | ICD-10-CM | POA: Diagnosis not present

## 2020-09-26 DIAGNOSIS — S72332D Displaced oblique fracture of shaft of left femur, subsequent encounter for closed fracture with routine healing: Secondary | ICD-10-CM | POA: Diagnosis not present

## 2020-09-27 DIAGNOSIS — S72332D Displaced oblique fracture of shaft of left femur, subsequent encounter for closed fracture with routine healing: Secondary | ICD-10-CM | POA: Diagnosis not present

## 2020-09-27 DIAGNOSIS — L89611 Pressure ulcer of right heel, stage 1: Secondary | ICD-10-CM | POA: Diagnosis not present

## 2020-09-27 DIAGNOSIS — M9702XD Periprosthetic fracture around internal prosthetic left hip joint, subsequent encounter: Secondary | ICD-10-CM | POA: Diagnosis not present

## 2020-09-27 DIAGNOSIS — N390 Urinary tract infection, site not specified: Secondary | ICD-10-CM | POA: Diagnosis not present

## 2020-09-27 NOTE — Telephone Encounter (Signed)
Please check with his oxygen supplier to see what orders they will need for a portable oxygen concentrator.  I sent a MyChart message to the patient/spouse asking about his typical O2 flow rate.  Thanks.

## 2020-09-28 DIAGNOSIS — L89611 Pressure ulcer of right heel, stage 1: Secondary | ICD-10-CM | POA: Diagnosis not present

## 2020-09-28 DIAGNOSIS — M9702XD Periprosthetic fracture around internal prosthetic left hip joint, subsequent encounter: Secondary | ICD-10-CM | POA: Diagnosis not present

## 2020-09-28 DIAGNOSIS — S72332D Displaced oblique fracture of shaft of left femur, subsequent encounter for closed fracture with routine healing: Secondary | ICD-10-CM | POA: Diagnosis not present

## 2020-09-28 DIAGNOSIS — N390 Urinary tract infection, site not specified: Secondary | ICD-10-CM | POA: Diagnosis not present

## 2020-10-02 NOTE — Telephone Encounter (Signed)
See mychart message from patient.  What is the status on portable oxygen concentrator?  Do I need to put in extra orders.  Many thanks.

## 2020-10-03 NOTE — Telephone Encounter (Signed)
Called and left a voicemail for patient to return call.

## 2020-10-03 NOTE — Telephone Encounter (Signed)
F/u    Patient wife returning call back to the nurse from today

## 2020-10-06 ENCOUNTER — Encounter: Payer: Self-pay | Admitting: Family Medicine

## 2020-10-10 DIAGNOSIS — I509 Heart failure, unspecified: Secondary | ICD-10-CM | POA: Diagnosis not present

## 2020-10-10 DIAGNOSIS — R062 Wheezing: Secondary | ICD-10-CM | POA: Diagnosis not present

## 2020-10-10 DIAGNOSIS — N390 Urinary tract infection, site not specified: Secondary | ICD-10-CM | POA: Diagnosis not present

## 2020-10-10 DIAGNOSIS — J449 Chronic obstructive pulmonary disease, unspecified: Secondary | ICD-10-CM | POA: Diagnosis not present

## 2020-10-10 DIAGNOSIS — J9600 Acute respiratory failure, unspecified whether with hypoxia or hypercapnia: Secondary | ICD-10-CM | POA: Diagnosis not present

## 2020-10-11 ENCOUNTER — Telehealth: Payer: Self-pay | Admitting: Family Medicine

## 2020-10-11 DIAGNOSIS — N400 Enlarged prostate without lower urinary tract symptoms: Secondary | ICD-10-CM

## 2020-10-11 DIAGNOSIS — Z96649 Presence of unspecified artificial hip joint: Secondary | ICD-10-CM | POA: Diagnosis not present

## 2020-10-11 DIAGNOSIS — M978XXA Periprosthetic fracture around other internal prosthetic joint, initial encounter: Secondary | ICD-10-CM | POA: Diagnosis not present

## 2020-10-11 DIAGNOSIS — J438 Other emphysema: Secondary | ICD-10-CM

## 2020-10-11 DIAGNOSIS — R0902 Hypoxemia: Secondary | ICD-10-CM

## 2020-10-11 NOTE — Telephone Encounter (Signed)
[name], pt's wife, reports they get the oxygen through [facility]. She does not have a local number for them or a national number. Advised this nurse will f/u with a local contact and then f/u with her. [name] Patent attorney and verbalized understanding.

## 2020-10-11 NOTE — Telephone Encounter (Signed)
Patient's wife called in and stated she would like to discuss with nurse what was discussed yesterday with the nurse, as patient does not remember. Please advise?

## 2020-10-12 NOTE — Telephone Encounter (Signed)
LVM for [name] Small at [facility]

## 2020-10-13 NOTE — Addendum Note (Signed)
Addended by: Randall An on: 10/13/2020 12:25 PM   Modules accepted: Orders

## 2020-10-13 NOTE — Telephone Encounter (Signed)
Order and last office note faxed.

## 2020-10-13 NOTE — Telephone Encounter (Signed)
I signed and printed the order.  Many thanks.

## 2020-10-13 NOTE — Telephone Encounter (Signed)
[facility] (the [facility] store) at [phone] and spoke with Suanne Marker. She reports a new order will need to be faxed to [phone], The order will need to include it is for a lightweight portable oxygen concentrator(they carry [employer] brand), the Liter flow, length of need,and whether the pt uses a cannula or facemask. She said to also fax office note but no demographics are needed since the pt is already in their system. She said their oxygen team will look at it and see if pt qualifies for it and contact the office if anything else is needed, such as further testing.   Pended oxygen order for signature.

## 2020-10-13 NOTE — Addendum Note (Signed)
Addended by: Tonia Ghent on: 10/13/2020 01:48 PM   Modules accepted: Orders

## 2020-10-16 DIAGNOSIS — R339 Retention of urine, unspecified: Secondary | ICD-10-CM | POA: Diagnosis not present

## 2020-10-17 NOTE — Telephone Encounter (Signed)
This has been documented in a telephone encounter on 12/1

## 2020-10-17 NOTE — Telephone Encounter (Signed)
This has been documented in a telephone encounter on 10/11/20.

## 2020-10-22 DIAGNOSIS — J9600 Acute respiratory failure, unspecified whether with hypoxia or hypercapnia: Secondary | ICD-10-CM | POA: Diagnosis not present

## 2020-10-22 DIAGNOSIS — R062 Wheezing: Secondary | ICD-10-CM | POA: Diagnosis not present

## 2020-10-22 DIAGNOSIS — J449 Chronic obstructive pulmonary disease, unspecified: Secondary | ICD-10-CM | POA: Diagnosis not present

## 2020-10-22 DIAGNOSIS — I509 Heart failure, unspecified: Secondary | ICD-10-CM | POA: Diagnosis not present

## 2020-10-24 DIAGNOSIS — S72332D Displaced oblique fracture of shaft of left femur, subsequent encounter for closed fracture with routine healing: Secondary | ICD-10-CM | POA: Diagnosis not present

## 2020-10-25 ENCOUNTER — Telehealth: Payer: Self-pay

## 2020-10-25 DIAGNOSIS — Z794 Long term (current) use of insulin: Secondary | ICD-10-CM | POA: Diagnosis not present

## 2020-10-25 DIAGNOSIS — E119 Type 2 diabetes mellitus without complications: Secondary | ICD-10-CM | POA: Diagnosis not present

## 2020-10-25 NOTE — Chronic Care Management (AMB) (Signed)
Chronic Care Management Pharmacy Assistant   Name: [name] Small  MRN: [id number] DOB: July 16, 1928  Reason for Encounter: Medication Review   PCP : Tonia Ghent, MD  Allergies:   Allergies  Allergen Reactions  . Actos [Pioglitazone Hydrochloride] Other (See Comments)    Held 2012 bladder cancer  . Aspirin Other (See Comments)    Held 2012 due to hematuria and bruising.   . Ace Inhibitors Cough  . Bactrim Rash and Other (See Comments)    Rash, presumed allergy  . Gabapentin Nausea And Vomiting    Upset stomach and diarrhea - tolerates low doses  . Lyrica [Pregabalin] Swelling    swelling  . Pravastatin Swelling    Swelling of feet  . Sulfa Drugs Cross Reactors Rash    Medications: Outpatient Encounter Medications as of 10/25/2020  Medication Sig  . acetaminophen (TYLENOL) 325 MG tablet Take 1-2 tablets (325-650 mg total) by mouth every 4 (four) hours as needed for mild pain.  Marland Kitchen Apremilast 30 MG TABS Take 1 tablet by mouth daily.  Marland Kitchen atorvastatin (LIPITOR) 10 MG tablet Take 1 tablet (10 mg total) by mouth daily.  . calcium carbonate (OS-CAL - DOSED IN MG OF ELEMENTAL CALCIUM) 1250 (500 Ca) MG tablet Take 1 tablet (500 mg of elemental calcium total) by mouth daily with breakfast.  . Cholecalciferol (VITAMIN D) 125 MCG (5000 UT) CAPS Take 1 capsule by mouth daily.  . clobetasol (TEMOVATE) 0.05 % external solution Apply 1 application topically daily as needed (rash).   Marland Kitchen docusate sodium (COLACE) 100 MG capsule Take 100 mg by mouth 2 (two) times daily.  Marland Kitchen doxycycline (ADOXA) 50 MG tablet Take 50 mg by mouth daily.  Marland Kitchen escitalopram (LEXAPRO) 5 MG tablet Take 1 tablet (5 mg total) by mouth daily.  . finasteride (PROSCAR) 5 MG tablet Take 5 mg by mouth daily.  . insulin glargine (LANTUS) 100 UNIT/ML Solostar Pen Inject 10 Units into the skin 2 (two) times daily.  . Multiple Vitamin (MULTIVITAMIN WITH MINERALS) TABS tablet Take 1 tablet by mouth daily.  Marland Kitchen omeprazole  (PRILOSEC) 40 MG capsule Take 1 capsule (40 mg total) by mouth daily.  . tamsulosin (FLOMAX) 0.4 MG CAPS capsule Take 2 capsules (0.8 mg total) by mouth daily after supper.  . traMADol (ULTRAM) 50 MG tablet Take 1 tablet (50 mg total) by mouth every 8 (eight) hours as needed for moderate pain or severe pain.  . traZODone (DESYREL) 50 MG tablet Take 0.5 tablets (25 mg total) by mouth at bedtime as needed for sleep.  Marland Kitchen umeclidinium bromide (INCRUSE ELLIPTA) 62.5 MCG/INH AEPB Inhale 1 puff into the lungs daily.  . [DISCONTINUED] iron polysaccharides (NIFEREX) 150 MG capsule Take 1 capsule (150 mg total) by mouth daily after supper.   No facility-administered encounter medications on file as of 10/25/2020.    Current Diagnosis: Patient Active Problem List   Diagnosis Date Noted  . Recurrent UTI 07/31/2020  . Insomnia 05/25/2020  . Advance care planning   . Gastroesophageal reflux disease with esophagitis without hemorrhage   . Ulcer of esophagus without bleeding   . Urinary retention with incomplete bladder emptying 05/10/2020  . Adjustment reaction of late life   . Acute kidney injury ([place])   . Gross hematuria   . Controlled type 2 diabetes mellitus with hyperglycemia, with long-term current use of insulin ([place])   . Benign essential HTN   . Anxiety 12/15/2019  . Anemia 01/31/2017  . Hypoxia   .  Status post lumbar laminectomy 12/26/2016    Class: Chronic  . History of femur fracture 12/26/2016  . Idiopathic chronic venous hypertension of left lower extremity with inflammation 11/07/2016  . Spinal stenosis of lumbar region with neurogenic claudication 10/18/2016  . COPD (chronic obstructive pulmonary disease) ([place]) 06/10/2016  . Weakness generalized 03/18/2016  . Critical lower limb ischemia ([place]) 02/28/2016  . Peripheral arterial disease ([place]) 10/17/2015  . Edema 07/19/2015  . Claudication ([place]) 01/31/2015  . DOE (dyspnea on exertion) 01/31/2015  . HTN (hypertension) 01/31/2015  .  Chronic diastolic CHF (congestive heart failure) ([place]) 01/21/2014  . Atrial fibrillation ([place]) 12/16/2012  . Bladder cancer ([place]) 09/22/2011  . Leg cramps 08/28/2011  . Cough 05/26/2011  . CAD (coronary artery disease) of artery bypass graft 05/09/2011  . Dyspnea on exertion 04/19/2011  . PSORIASIS, SCALP 10/25/2008  . Chronic kidney disease, stage III (moderate) ([place]) 12/04/2007  . Type 2 diabetes mellitus with diabetic polyneuropathy ([place]) 05/21/2007  . DIVERTICULOSIS, COLON 05/21/2007  . BPH (benign prostatic hyperplasia) 05/21/2007   Have you seen any other providers since your last visit? Yes 10/24/20 Dr. [name]  -Orthopedics 10/26/20 Dr. Louis Meckel- Urology 09/04/20 Dr. [name]- PCP was last office visit.  Any changes in your medications or health? No  Any side effects from any medications? No  Do you have an symptoms or problems not managed by your medications? No  Any concerns about your health right now? Recurrent UTI's  Has your provider asked that you check blood pressure, blood sugar, or follow special diet at home? Yes, checks his blood sugar  Do you get any type of exercise on a regular basis? "Gets up and walks around the house"  Can you think of a goal you would like to reach for your health? Would like to get stronger  Do you have any problems getting your medications? No,  they are ok on that."  Is there anything that you would like to discuss during the appointment? Recurrent UTI and headaches.  Patient reminded to have all medications and supplements available to him for review on his 11/01/20 telephone appointment with Debbora Dus at 11:00.   Follow-Up:  Pharmacist Review   Debbora Dus, CPP notified  Margaretmary Dys, [place] Pharmacy Assistant [phone]

## 2020-10-26 ENCOUNTER — Telehealth: Payer: Self-pay | Admitting: Cardiovascular Disease

## 2020-10-26 DIAGNOSIS — R3914 Feeling of incomplete bladder emptying: Secondary | ICD-10-CM | POA: Diagnosis not present

## 2020-10-26 NOTE — Telephone Encounter (Signed)
3 attempts to schedule fu appt from recall list.   Deleting recall.   

## 2020-10-30 MED ORDER — TAMSULOSIN HCL 0.4 MG PO CAPS: 0.8000 mg | ORAL_CAPSULE | Freq: Every day | ORAL | 3 refills | Status: DC

## 2020-10-30 NOTE — Addendum Note (Signed)
Addended by: Randall An on: 10/30/2020 04:18 PM   Modules accepted: Orders

## 2020-10-30 NOTE — Telephone Encounter (Signed)
[name] reports insurance will not pay for O2 concentrator and neither will the [place]. It would cost $2800 out of pocket. Advised to try the company [employer]. They make one type of concentrator and they sell directly to pts and they may be able to help with the insurance. [name] Small attorney and verbalized understanding.   [name] asked for refill of flomax sent to [facility].Advised refill would be sent. Advised if anything else is needed to contact office. [name] Small attorney and verbalized understanding.   Sent in script for flomax  Last apt: 09/04/20 Next apt: none scheduled

## 2020-10-31 NOTE — Telephone Encounter (Signed)
Noted. Thanks.

## 2020-11-01 ENCOUNTER — Other Ambulatory Visit: Payer: Self-pay

## 2020-11-01 ENCOUNTER — Telehealth: Payer: Medicare Other

## 2020-11-01 ENCOUNTER — Ambulatory Visit: Payer: Medicare Other

## 2020-11-01 DIAGNOSIS — E1165 Type 2 diabetes mellitus with hyperglycemia: Secondary | ICD-10-CM

## 2020-11-01 DIAGNOSIS — E785 Hyperlipidemia, unspecified: Secondary | ICD-10-CM

## 2020-11-01 NOTE — Chronic Care Management (AMB) (Signed)
Chronic Care Management Pharmacy  Name: [name] Small  MRN: [id number] DOB: 19-Jan-1928  Chief Complaint/ HPI  [name] Small,  86 y.o. , male presents for their Initial CCM visit with the clinical pharmacist via telephone.  PCP : Tonia Ghent, MD  Their chronic conditions include: CAD, AFIB, CHF, HTN, PAD, COPD, GERD with esophagitis/ucler, DM, BPH, psoriasis, anemia   Patient concerns: Reports UTIs have improved since starting cranberry twice daily and changing catheter every 4 hours.   Office Visits:  08/2520: PCP virtual visit - stop sliding scale insulin, continue other meds  Consult Visit:  09/01/20: Dermatology  - restart [place] Nail, ran out during hospital stay, flaring up  09/01/20: Pharmacy note - Dr Lyman Speller had a visit with this pt today. Rx for [facility] sent into the [place].   08/18/20: Palliative Care Consult - PT OT is ongoing with Left posterior hip precaution and touch down left leg weight bearing restriction. Pain is well managed with with tramadol, acetaminophen, Robaxin. He saw urologist yesterday who advised keeping his indwelling Foley catheter for now; to return in 2 weeks.No recent COPD exacerbation; patient is compliant with his medications and breathing treatment; on oxygen supplementation 2 L/min  Patient was seen in his room and also seen cooperating with occupational therapist.  9/21//21: Femur surgery  07/31/20: ED  - fracture left femur/mechanical fall, fall and left leg pain, multidrug-resistant E. coli UTI on 9/1 and was prescribed Keflexand found to have recurrent UTI recently at the urology office, discharged to SNF (9/20-10/11 - [facility])  Allergies  Allergen Reactions  . Actos [Pioglitazone Hydrochloride] Other (See Comments)    Held 2012 bladder cancer  . Aspirin Other (See Comments)    Held 2012 due to hematuria and bruising.   . Ace Inhibitors Cough  . Bactrim Rash and Other (See Comments)    Rash, presumed allergy  . Gabapentin Nausea And  Vomiting    Upset stomach and diarrhea - tolerates low doses  . Lyrica [Pregabalin] Swelling    swelling  . Pravastatin Swelling    Swelling of feet  . Sulfa Drugs Cross Reactors Rash   Medications: Outpatient Encounter Medications as of 11/01/2020  Medication Sig  . acetaminophen (TYLENOL) 325 MG tablet Take 1-2 tablets (325-650 mg total) by mouth every 4 (four) hours as needed for mild pain.  Marland Kitchen Apremilast 30 MG TABS Take 1 tablet by mouth daily.  Marland Kitchen atorvastatin (LIPITOR) 10 MG tablet Take 1 tablet (10 mg total) by mouth daily.  . calcium carbonate (OS-CAL - DOSED IN MG OF ELEMENTAL CALCIUM) 1250 (500 Ca) MG tablet Take 1 tablet (500 mg of elemental calcium total) by mouth daily with breakfast.  . Cholecalciferol (VITAMIN D) 125 MCG (5000 UT) CAPS Take 1 capsule by mouth daily.  . clobetasol (TEMOVATE) 0.05 % external solution Apply 1 application topically daily as needed (rash).   . CRANBERRY PO Take 4 capsules by mouth daily.  Marland Kitchen docusate sodium (COLACE) 100 MG capsule Take 100 mg by mouth daily.  Marland Kitchen doxycycline (ADOXA) 50 MG tablet Take 50 mg by mouth daily.  Marland Kitchen escitalopram (LEXAPRO) 5 MG tablet Take 1 tablet (5 mg total) by mouth daily.  . finasteride (PROSCAR) 5 MG tablet Take 5 mg by mouth daily.  . insulin glargine (LANTUS) 100 UNIT/ML Solostar Pen Inject 10 Units into the skin 2 (two) times daily.  . Multiple Vitamin (MULTIVITAMIN WITH MINERALS) TABS tablet Take 1 tablet by mouth daily.  Marland Kitchen omeprazole (PRILOSEC) 40 MG capsule  Take 1 capsule (40 mg total) by mouth daily.  . tamsulosin (FLOMAX) 0.4 MG CAPS capsule Take 2 capsules (0.8 mg total) by mouth daily after supper.  . tiotropium (SPIRIVA) 18 MCG inhalation capsule Place 18 mcg into inhaler and inhale daily.  . traMADol (ULTRAM) 50 MG tablet Take 1 tablet (50 mg total) by mouth every 8 (eight) hours as needed for moderate pain or severe pain.  . traZODone (DESYREL) 50 MG tablet Take 0.5 tablets (25 mg total) by mouth at bedtime  as needed for sleep.  . [DISCONTINUED] iron polysaccharides (NIFEREX) 150 MG capsule Take 1 capsule (150 mg total) by mouth daily after supper.  . [DISCONTINUED] umeclidinium bromide (INCRUSE ELLIPTA) 62.5 MCG/INH AEPB Inhale 1 puff into the lungs daily.   No facility-administered encounter medications on file as of 11/01/2020.   Current Diagnosis/Assessment:  SDOH Interventions   Flowsheet Row Most Recent Value  SDOH Interventions   Financial Strain Interventions Intervention Not Indicated     Goals Addressed            This Visit's Progress   . Pharmacy Care Plan       CARE PLAN ENTRY (see longitudinal plan of care for additional care plan information)  Current Barriers:  . Chronic Disease Management support, education, and care coordination needs related to Hyperlipidemia and Diabetes   Hyperlipidemia Lab Results  Component Value Date/Time   LDLCALC 61 10/22/2019 09:11 AM   LDLDIRECT 147.6 04/27/2009 10:12 AM   . Pharmacist Clinical Goal(s): o Over the next 6 months, patient will work with PharmD and providers to maintain LDL goal < 70 . Current regimen:   Atorvastatin (LIPITOR) 10 MG tablet - 1 daily . Interventions: o Reviewed adherence and LDL goals . Patient self care activities - Over the next 6 months, patient will: o Continue medication as prescribed  o Incorporate a healthy diet high in vegetables, fruits and whole grains with low-fat dairy products, chicken, fish, legumes, non-tropical vegetable oils and nuts. Limit intake of sweets, sugar-sweetened beverages and red meats.  Diabetes Lab Results  Component Value Date/Time   HGBA1C 8.6 (H) 07/20/2020 10:30 AM   HGBA1C 9.3 (H) 04/19/2020 04:06 AM   . Pharmacist Clinical Goal(s): o Over the next 6 months, patient will work with PharmD and providers to achieve A1c goal <8% . Current regimen:   Insulin glargine (LANTUS) vial - 10 units twice daily . Interventions: o Discussed option of once weekly  Ozempic or similar medication to reduce number of injections . Patient self care activities - Over the next 6 months, patient will: o Check blood sugar once daily, document, and provide at future appointments o Call if you would like to consider getting a weekly injection through [place] until you see your [facility] provider  o Contact provider with any episodes of hypoglycemia  Initial goal documentation      Hypertension   CMP Latest Ref Rng & Units 08/07/2020 08/04/2020 08/02/2020  Glucose 70 - 99 mg/dL 187(H) 332(H) 510(HH)  BUN 8 - 23 mg/dL 32(H) 48(H) 60(H)  Creatinine 0.61 - 1.24 mg/dL 1.47(H) 1.72(H) 2.55(H)  Sodium 135 - 145 mmol/L 136 133(L) 130(L)  Potassium 3.5 - 5.1 mmol/L 4.2 4.5 5.6(H)  Chloride 98 - 111 mmol/L 98 96(L) 96(L)  CO2 22 - 32 mmol/L 30 28 21(L)  Calcium 8.9 - 10.3 mg/dL 8.4(L) 8.0(L) 7.7(L)  Total Protein 6.5 - 8.1 g/dL - - -  Total Bilirubin 0.3 - 1.2 mg/dL - - -  Alkaline  Phos 38 - 126 U/L - - -  AST 15 - 41 U/L - - -  ALT 0 - 44 U/L - - -   Office blood pressures are: BP Readings from Last 3 Encounters:  11/07/20 122/64  09/04/20 (!) 132/56  08/08/20 138/64   BP goal < 140/90 mmHg  Patient has failed these meds in the past: none reported Patient checks BP at home: none   Patient is currently controlled on the following medications:   No pharmacotherapy  We discussed:  BP in clinic controlled. Wife reports BP has never been an issue as far as she is aware.  Plan: Continue current medications  Hyperlipidemia/CAD   LDL goal < 70  Last lipids Lab Results  Component Value Date   CHOL 129 10/22/2019   HDL 43.00 10/22/2019   LDLCALC 61 10/22/2019   LDLDIRECT 147.6 04/27/2009   TRIG 123.0 10/22/2019   CHOLHDL 3 10/22/2019   Hepatic Function Latest Ref Rng & Units 07/31/2020 06/15/2020 05/20/2020  Total Protein 6.5 - 8.1 g/dL 7.4 7.0 6.4(L)  Albumin 3.5 - 5.0 g/dL 3.3(L) 3.5 3.1(L)  AST 15 - 41 U/L 14(L) 13 19  ALT 0 - 44 U/L 11 9 10   Alk  Phosphatase 38 - 126 U/L 80 77 85  Total Bilirubin 0.3 - 1.2 mg/dL 0.5 0.4 0.8  Bilirubin, Direct <=0.2 mg/dL - - -    The ASCVD Risk score ([place]., et al., 2013) failed to calculate for the following reasons:   The 2013 ASCVD risk score is only valid for ages 29 to 62   Patient has failed these meds in past: none reported Patient is currently controlled on the following medications:   Atorvastatin (LIPITOR) 10 MG tablet - 1 daily  We discussed:  Confirms taking daily, LDL within goal  Plan: Continue current medications  COPD / Tobacco   Does not see a pulmonologist   Eosinophil count:   Lab Results  Component Value Date/Time   EOSPCT 2.4 07/20/2020 10:30 AM  %                               Eos (Absolute):  Lab Results  Component Value Date/Time   EOSABS 0.2 07/20/2020 10:30 AM   Tobacco Status:  Social History   Tobacco Use  Smoking Status Former Smoker  . Packs/day: 2.00  . Years: 22.00  . Pack years: 44.00  . Types: Cigarettes  . Quit date: 11/11/1968  . Years since quitting: 52.0  Smokeless Tobacco Never Used   Patient has failed these meds in past: Incruse, Symbicort Patient is currently controlled on the following medications:   Spiriva - Inhale 1 puff daily   On oxygen at night   Using maintenance inhaler regularly? Yes Frequency of rescue inhaler use:  never - does not have albuterol inhaler or nebulizer  We discussed: Taking Spiriva every morning. Reports SOB is minimal.   Plan: Continue current medications  Diabetes   Recent Relevant Labs: Lab Results  Component Value Date/Time   HGBA1C 8.6 (H) 07/20/2020 10:30 AM   HGBA1C 9.3 (H) 04/19/2020 04:06 AM   MICROALBUR 1.1 10/11/2015 09:44 AM   MICROALBUR 1.5 08/01/2009 09:21 AM   A1c goal < 8% Pt checks his own BG once daily. Reports his BG is usually in low 100s.  Pt handles his insulin dosing, wife does not assist, he adjusts based on meal size from 5-10 units BID.  Wife reports pt never  takes more than 10 units BID. She states he has been doing his insulin a very long time and he does not have any issues with his memory in regard to insulin.  Wife reports 2 episodes of BG < 70 this month, due to skipping meals  Drinks orange juice whenever his BG drops   Patient has failed these meds in past: Novolog, Actos (bladder cancer) Patient is currently uncontrolled on the following medications:   Insulin glargine (LANTUS) vial - 10 units BID (pt gets vials from [place])   We discussed: Discussed potential to try Ozempic to reduce risk of hypoglycemia and daily injections - wife is very interested. She will discuss with [facility] at next visit.  Sees [facility], Dr. Domenica Fail once a year.  Plan: Continue current medications   GERD   Patient has failed these meds in past: none reported Patient is currently controlled on the following medications:   Omeprazole 40 mg - 1 capsule daily before breakfast   H/o ulcers, esophagitis  Plan: Continue current therapy  BPH   PSA  Date Value Ref Range Status  04/27/2009 1.78 0.10 - 4.00 ng/mL Final  11/25/2007 1.84 0.10 - 4.00 ng/mL Final    Patient has failed these meds in past: none Patient is currently controlled on the following medications:   Tamsulosin (FLOMAX) 0.4 MG CAPS  - 2 capsules PM   Finasteride (PROSCAR) 5 MG tablet - 1 tablet daily   Plan: Continue current medications   Anxiety/Insomnia   Patient has failed these meds in past: melatonin 5 and 10 mg - ineffective Patient is currently controlled on the following medications:   TraZODone (DESYREL) 50 MG tablet - 1/2 tablet qhs   Escitalopram (LEXAPRO) 5 MG tablet  - 1 tablet daily  Tylenol PM - 1 tablet at bedtime  We discussed: Pt doing well on current therapy. Recommend limiting Tylenol PM considering age/risk of adverse effects including falls.  Plan: Continue current medications. Recommend limiting Tylenol PM considering age/risk of adverse effects including  falls.  Anemia    CBC Latest Ref Rng & Units 08/07/2020 08/04/2020 08/02/2020  WBC 4.0 - 10.5 K/uL 9.8 10.2 10.5  Hemoglobin 13.0 - 17.0 g/dL 10.6(L) 10.3(L) 11.1(L)  Hematocrit 39.0 - 52.0 % 33.5(L) 32.9(L) 35.6(L)  Platelets 150 - 400 K/uL 315 277 314   Iron/TIBC/Ferritin/ %Sat    Component Value Date/Time   IRON 32 (L) 04/28/2020 0444   TIBC 220 (L) 04/28/2020 0444   FERRITIN 110 04/28/2020 0444   IRONPCTSAT 15 (L) 04/28/2020 0444   Patient has failed these meds in past: iron - diarrhea Patient is currently controlled on the following medications:   No pharmacotherapy   We discussed: Ferritin normal, stable without iron supplementation. Hemoglobin/hematocrit low. No anticoagulants/antiplatelets. Avoids NSAIDS.   Plan: Continue monitoring. Avoid NSAIDs.  Bone Health   08/02/20 Vit D level -  29.76 Patient has failed these meds in past: none reported Patient is currently controlled on the following medications:   Vit D3 5000 IU - 1 daily  Calcium carbonate - 1 daily  We discussed: confirmed adherence  Plan: Continue current medications  Medication Management   Patient's preferred pharmacy is: [facility] - insulin, [facility] - all other medications   Uses pill box? Yes Pt endorses 100% compliance. Wife organizes his pillbox. Pt doses his own insulin.   Morning Omeprazole  Finasteride Vitamin D3 Escitalopram Apremilast Spiriva Calcium Multivitamin Stool softener Cranberry  Night  Doxycycline Flomax 2 tabs Trazodone 1/2  Atorvastatin 10 mg  Cranberry   PRNs does not use daily Hydrocodone Methocarbamol Tramadol Tylenol Extra Strength   OTCs: Centrum mens gummy - 1 daily Tylenol extra strength - 1 PRN (not taking daily) Vitamin D3 5000 IU - 1 daily  Stool softener - 1 daily Calcium carbonate - 1 daily AZO Cranberry urinary tract health/vitamin C - 2 tablet (500 mg cranberry) BID Tylenol PM - 1 every night Not taking melatonin -  not effective tried 5 and 10 mg  We discussed: Discussed benefits of medication synchronization, packaging and delivery as well as enhanced pharmacist oversight with [facility].  Plan  Continue current medication management strategy  Follow up: 6 month phone visit  Debbora Dus, PharmD Clinical Pharmacist [facility] at [facility] [phone]

## 2020-11-06 DIAGNOSIS — R339 Retention of urine, unspecified: Secondary | ICD-10-CM | POA: Diagnosis not present

## 2020-11-07 ENCOUNTER — Other Ambulatory Visit: Payer: Self-pay

## 2020-11-07 ENCOUNTER — Ambulatory Visit (INDEPENDENT_AMBULATORY_CARE_PROVIDER_SITE_OTHER): Payer: Medicare Other | Admitting: Family Medicine

## 2020-11-07 ENCOUNTER — Encounter: Payer: Self-pay | Admitting: Family Medicine

## 2020-11-07 DIAGNOSIS — L97509 Non-pressure chronic ulcer of other part of unspecified foot with unspecified severity: Secondary | ICD-10-CM | POA: Insufficient documentation

## 2020-11-07 DIAGNOSIS — L97511 Non-pressure chronic ulcer of other part of right foot limited to breakdown of skin: Secondary | ICD-10-CM | POA: Diagnosis not present

## 2020-11-07 NOTE — Progress Notes (Signed)
Subjective:    Patient ID: [name] Small, male    DOB: April 26, 1928, 86 y.o.   MRN: [id number]  This visit occurred during the SARS-CoV-2 public health emergency.  Safety protocols were in place, including screening questions prior to the visit, additional usage of staff PPE, and extensive cleaning of exam room while observing appropriate contact time as indicated for disinfecting solutions.    HPI  86 yo pt of Dr Damita Dunnings presents with a foot concern  Wt Readings from Last 3 Encounters:  09/04/20 205 lb (93 kg)  08/05/20 216 lb 4.3 oz (98.1 kg)  06/09/20 (!) 210 lb (95.3 kg)    he has h/o copd and vascular dz and DM 2 with neuropathy and CKD He is a palliative care pt   Place on heal-started out as a blister  Healing and peeling/dry skin  Leaves open during the day  Has become more dark- scab looking   Not painful  Has neuropathy however   Glucose is in good control   Had some amputation of toes on L foot  Sees Dr Arida-did last [place] of legs and was stable   Has had hip replacements   Patient Active Problem List   Diagnosis Date Noted  . Ulcer of foot ([place]) 11/07/2020  . Recurrent UTI 07/31/2020  . Insomnia 05/25/2020  . Advance care planning   . Gastroesophageal reflux disease with esophagitis without hemorrhage   . Ulcer of esophagus without bleeding   . Urinary retention with incomplete bladder emptying 05/10/2020  . Adjustment reaction of late life   . Acute kidney injury ([place])   . Gross hematuria   . Controlled type 2 diabetes mellitus with hyperglycemia, with long-term current use of insulin ([place])   . Benign essential HTN   . Anxiety 12/15/2019  . Anemia 01/31/2017  . Hypoxia   . Status post lumbar laminectomy 12/26/2016    Class: Chronic  . History of femur fracture 12/26/2016  . Idiopathic chronic venous hypertension of left lower extremity with inflammation 11/07/2016  . Spinal stenosis of lumbar region with neurogenic claudication 10/18/2016  . COPD  (chronic obstructive pulmonary disease) ([place]) 06/10/2016  . Weakness generalized 03/18/2016  . Critical lower limb ischemia ([place]) 02/28/2016  . Peripheral arterial disease ([place]) 10/17/2015  . Edema 07/19/2015  . Claudication ([place]) 01/31/2015  . DOE (dyspnea on exertion) 01/31/2015  . HTN (hypertension) 01/31/2015  . Chronic diastolic CHF (congestive heart failure) ([place]) 01/21/2014  . Atrial fibrillation ([place]) 12/16/2012  . Bladder cancer ([place]) 09/22/2011  . Leg cramps 08/28/2011  . Cough 05/26/2011  . CAD (coronary artery disease) of artery bypass graft 05/09/2011  . Dyspnea on exertion 04/19/2011  . PSORIASIS, SCALP 10/25/2008  . Chronic kidney disease, stage III (moderate) ([place]) 12/04/2007  . Type 2 diabetes mellitus with diabetic polyneuropathy ([place]) 05/21/2007  . DIVERTICULOSIS, COLON 05/21/2007  . BPH (benign prostatic hyperplasia) 05/21/2007   Past Medical History:  Diagnosis Date  . (HFpEF) heart failure with preserved ejection fraction ([place])   . Arthritis   . Back pain    s/p lumbar injection 2014  . BENIGN PROSTATIC HYPERTROPHY 05/21/2007  . Bladder cancer ([place]) 10/2011  . CAD (coronary artery disease)    nonobstructive by cath 6/12:  mid LAD 30%, proximal obtuse marginal-2 30%, proximal RCA 20%, mid RCA 30-40%.  He had normal cardiac output and mildly elevated filling pressures but no significant pulmonary hypertension;   Echocardiogram in May 2012 demonstrated EF 50-55% and left atrial enlargement   .  Cellulitis of left foot    Hospitalized in 2006  . Chronic kidney disease (CKD), stage III (moderate) ([place]) 12/04/2007   FOLLOWED BY DR PATEL  . Chronic pain of lower extremity   . Complication of anesthesia    1 time bladder cancer surgery- 2012 , oxygen satruratioon dropped had to stay overnight, no problems since then.  Marland Kitchen COPD (chronic obstructive pulmonary disease) ([place])   . DIABETES MELLITUS, TYPE II 05/21/2007  . DIVERTICULOSIS, COLON 05/21/2007   pt denies  .  Dyspnea    with exertion, patient mointors saturation  . Fatigue AGE-RELATED  . Gross hematuria    pt denies  . HYPERTENSION 05/21/2007  . Impaired hearing BILATERAL HEARING AIDS  . On home oxygen therapy    as needed  . PAD (peripheral artery disease) ([place])    a. 02/2016 L foot nonhealing ulcer-->Periph angio: L pop 100 w/ reconstitution via extensive vollats to the prox-mid peroneal (only patent vessel BK)-->PTA of L Pop and peroneal; b. Ongoing LE ischemia 5 & 06/2016 req amputation of toes on L foot.  . Pneumonia    Hx  of   . Psoriasis ELBOWS  . Psoriasis   . PSORIASIS, SCALP 10/25/2008   Past Surgical History:  Procedure Laterality Date  . AMPUTATION Left 03/27/2016   Procedure: partial first AMPUTATION RAY; LEFT;  Surgeon: Trula Slade, DPM;  Location: [facility];  Service: Podiatry;  Laterality: Left;  . AMPUTATION Left 06/12/2016   Procedure: TRANSMETATARSAL AMPUTATION LEFT FOOT;  Surgeon: Newt Minion, MD;  Location: [facility];  Service: Orthopedics;  Laterality: Left;  . APPENDECTOMY  1941  . BIOPSY  05/16/2020   Procedure: BIOPSY;  Surgeon: Lavena Bullion, DO;  Location: [facility];  Service: Gastroenterology;;  . CARDIAC CATHETERIZATION  04-24-11/  DR Rogue Jury ARIDA   MILD NONOBSTRUCTIVE CAD, NORMA CARDIAC OUTPUT  . CARDIOVASCULAR STRESS TEST  2007  . CATARACT EXTRACTION W/ INTRAOCULAR LENS  IMPLANT, BILATERAL Bilateral ~ 2010  . CYSTOSCOPY  12/25/2011   Procedure: CYSTOSCOPY;  Surgeon: Molli Hazard, MD;  Location: [facility];  Service: Urology;  Laterality: N/A;  needs intubation and to be paralyzed   . CYSTOSCOPY W/ RETROGRADES  10/30/2011   Procedure: CYSTOSCOPY WITH RETROGRADE PYELOGRAM;  Surgeon: Molli Hazard, MD;  Location: [facility];  Service: Urology;  Laterality: Bilateral;  CYSTOSCOPY POSS TURBT BILATERAL RETROGRADE PYLEOGRAM   . CYSTOSCOPY W/ RETROGRADES  11/30/2012   Procedure: CYSTOSCOPY WITH RETROGRADE  PYELOGRAM;  Surgeon: Molli Hazard, MD;  Location[facility];  Service: Urology;  Laterality: Bilateral;  Flexible cystoscopy.  . CYSTOSCOPY WITH BIOPSY  11/30/2012   Procedure: CYSTOSCOPY WITH BIOPSY;  Surgeon: Molli Hazard, MD;  Location: [facility];  Service: Urology;  Laterality: N/A;  . ESOPHAGOGASTRODUODENOSCOPY (EGD) WITH PROPOFOL N/A 05/16/2020   Procedure: ESOPHAGOGASTRODUODENOSCOPY (EGD) WITH PROPOFOL;  Surgeon: Lavena Bullion, DO;  Location: [facility];  Service: Gastroenterology;  Laterality: N/A;  . FEMUR IM NAIL Left 08/01/2020   Procedure: INTRAMEDULLARY (IM) RETROGRADE FEMORAL NAILING;  Surgeon: Altamese , MD;  Location: [facility];  Service: Orthopedics;  Laterality: Left;  . INCISION AND DRAINAGE FOOT Left ~ 2005   LEFT FOOT DUE TO INFECTION FROM  NAIL PUNCTURE INJURY  . LUMBAR LAMINECTOMY/DECOMPRESSION MICRODISCECTOMY N/A 10/18/2016   Procedure: LEFT AND CENTRAL L4-5 LUMBAR LAMINECTOMY WITH RESECTION OF SYNOVIAL CYST;  Surgeon: Jessy Oto, MD;  Location: [facility];  Service: Orthopedics;  Laterality: N/A;  .  ORIF FEMUR FRACTURE Left 04/19/2020   Procedure: OPEN REDUCTION INTERNAL FIXATION FEMORAL SHAFT FRACTURE;  Surgeon: Shona Needles, MD;  Location: [facility];  Service: Orthopedics;  Laterality: Left;  . PENILE PROSTHESIS IMPLANT  1990s  . PERIPHERAL VASCULAR CATHETERIZATION N/A 02/14/2016   Procedure: Abdominal Aortogram w/Lower Extremity;  Surgeon: Wellington Hampshire, MD;  Location: [facility];  Service: Cardiovascular;  Laterality: N/A;  . PERIPHERAL VASCULAR CATHETERIZATION Left 02/28/2016   Procedure: Peripheral Vascular Balloon Angioplasty;  Surgeon: Wellington Hampshire, MD;  Location: [facility];  Service: Cardiovascular;  Laterality: Left;  left peroneal and popliteal artery  . [place]  . TOTAL HIP ARTHROPLASTY Left 12/27/2016   Procedure: LEFT TOTAL HIP ARTHROPLASTY ANTERIOR APPROACH;   Surgeon: Mcarthur Rossetti, MD;  Location: [facility];  Service: Orthopedics;  Laterality: Left;  . TRANSURETHRAL RESECTION OF BLADDER TUMOR  10/30/2011   Procedure: TRANSURETHRAL RESECTION OF BLADDER TUMOR (TURBT);  Surgeon: Molli Hazard, MD;  Location: [facility];  Service: Urology;  Laterality: N/A;  . TRANSURETHRAL RESECTION OF BLADDER TUMOR  12/25/2011   Procedure: TRANSURETHRAL RESECTION OF BLADDER TUMOR (TURBT);  Surgeon: Molli Hazard, MD;  Location: [facility];  Service: Urology;  Laterality: N/A;  need long gyrus instruments   . VASECTOMY  1990s   Social History   Tobacco Use  . Smoking status: Former Smoker    Packs/day: 2.00    Years: 22.00    Pack years: 44.00    Types: Cigarettes    Quit date: 11/11/1968    Years since quitting: 52.0  . Smokeless tobacco: Never Used  Vaping Use  . Vaping Use: Never used  Substance Use Topics  . Alcohol use: Yes    Comment: occassional- 10/17/16- none  in 4 months- "too sick"  . Drug use: No   Family History  Problem Relation Age of Onset  . Diabetes Mother   . Heart disease Brother   . Heart disease Brother   . Heart disease Brother   . Heart disease Brother   . Heart disease Brother   . Heart disease Brother   . Lung cancer Brother    Allergies  Allergen Reactions  . Actos [Pioglitazone Hydrochloride] Other (See Comments)    Held 2012 bladder cancer  . Aspirin Other (See Comments)    Held 2012 due to hematuria and bruising.   . Ace Inhibitors Cough  . Bactrim Rash and Other (See Comments)    Rash, presumed allergy  . Gabapentin Nausea And Vomiting    Upset stomach and diarrhea - tolerates low doses  . Lyrica [Pregabalin] Swelling    swelling  . Pravastatin Swelling    Swelling of feet  . Sulfa Drugs Cross Reactors Rash   Current Outpatient Medications on File Prior to Visit  Medication Sig Dispense Refill  . acetaminophen (TYLENOL) 325 MG tablet Take 1-2 tablets  (325-650 mg total) by mouth every 4 (four) hours as needed for mild pain.    Marland Kitchen Apremilast 30 MG TABS Take 1 tablet by mouth daily.    Marland Kitchen atorvastatin (LIPITOR) 10 MG tablet Take 1 tablet (10 mg total) by mouth daily. 90 tablet 3  . calcium carbonate (OS-CAL - DOSED IN MG OF ELEMENTAL CALCIUM) 1250 (500 Ca) MG tablet Take 1 tablet (500 mg of elemental calcium total) by mouth daily with breakfast. 30 tablet 0  . Cholecalciferol (VITAMIN D) 125 MCG (5000 UT) CAPS Take 1 capsule  by mouth daily. 30 capsule 3  . clobetasol (TEMOVATE) 0.05 % external solution Apply 1 application topically daily as needed (rash).     . CRANBERRY PO Take 4 capsules by mouth daily.    Marland Kitchen docusate sodium (COLACE) 100 MG capsule Take 100 mg by mouth daily.    Marland Kitchen doxycycline (ADOXA) 50 MG tablet Take 50 mg by mouth daily.    Marland Kitchen escitalopram (LEXAPRO) 5 MG tablet Take 1 tablet (5 mg total) by mouth daily. 90 tablet 1  . finasteride (PROSCAR) 5 MG tablet Take 5 mg by mouth daily.    . insulin glargine (LANTUS) 100 UNIT/ML Solostar Pen Inject 10 Units into the skin 2 (two) times daily. 15 mL 11  . Multiple Vitamin (MULTIVITAMIN WITH MINERALS) TABS tablet Take 1 tablet by mouth daily.    Marland Kitchen omeprazole (PRILOSEC) 40 MG capsule Take 1 capsule (40 mg total) by mouth daily. 30 capsule 5  . tamsulosin (FLOMAX) 0.4 MG CAPS capsule Take 2 capsules (0.8 mg total) by mouth daily after supper. 60 capsule 3  . tiotropium (SPIRIVA) 18 MCG inhalation capsule Place 18 mcg into inhaler and inhale daily.    . traMADol (ULTRAM) 50 MG tablet Take 1 tablet (50 mg total) by mouth every 8 (eight) hours as needed for moderate pain or severe pain. 30 tablet 0  . traZODone (DESYREL) 50 MG tablet Take 0.5 tablets (25 mg total) by mouth at bedtime as needed for sleep. 45 tablet 3   No current facility-administered medications on file prior to visit.     Review of Systems  Constitutional: Positive for fatigue. Negative for activity change, appetite change,  fever and unexpected weight change.  HENT: Negative for congestion, rhinorrhea, sore throat and trouble swallowing.   Eyes: Negative for pain, redness, itching and visual disturbance.  Respiratory: Negative for cough, chest tightness, shortness of breath and wheezing.   Cardiovascular: Negative for chest pain and palpitations.  Gastrointestinal: Negative for abdominal pain, blood in stool, constipation, diarrhea and nausea.  Endocrine: Negative for cold intolerance, heat intolerance, polydipsia and polyuria.  Genitourinary: Negative for difficulty urinating, dysuria, frequency and urgency.  Musculoskeletal: Negative for arthralgias, joint swelling and myalgias.  Skin: Positive for wound. Negative for pallor and rash.  Neurological: Positive for numbness. Negative for dizziness, tremors, weakness and headaches.  Hematological: Negative for adenopathy. Does not bruise/bleed easily.  Psychiatric/Behavioral: Negative for decreased concentration and dysphoric mood. The patient is not nervous/anxious.        Objective:   Physical Exam Constitutional:      General: He is not in acute distress.    Appearance: Normal appearance. He is normal weight. He is not ill-appearing.     Comments: Frail appearing elderly male in wheelchair  HENT:     Mouth/Throat:     Mouth: Mucous membranes are moist.  Eyes:     General: No scleral icterus.    Conjunctiva/sclera: Conjunctivae normal.     Pupils: Pupils are equal, round, and reactive to light.  Cardiovascular:     Rate and Rhythm: Normal rate.     Heart sounds: Normal heart sounds.     Comments: R foot is warm but unable to palp a good pedal pulse  L foot-amputated toes  Pulmonary:     Effort: Pulmonary effort is normal. No respiratory distress.     Breath sounds: Normal breath sounds. No wheezing.  Musculoskeletal:     Cervical back: Normal range of motion and neck supple.  Right lower leg: No edema.     Left lower leg: No edema.      Comments: S/p amputation of toes on L foot  Lymphadenopathy:     Cervical: No cervical adenopathy.  Skin:    Coloration: Skin is not pale.     Findings: No erythema or rash.     Comments: 2.5 by 3.5 cm ulcer on R heel- with echar- fairly dry  Dry skin surrounding (into fat layer)  No redness or swelling Little to no tenderness    Hydrocolloid dressing applied  Neurological:     Mental Status: He is alert.     Sensory: Sensory deficit present.     Comments: Poor sensation in feet  Generalized weakness  Psychiatric:        Mood and Affect: Mood normal.           Assessment & Plan:   Problem List Items Addressed This Visit      Other   Ulcer of foot ([place])    Large ulcer on R heel with eschar/mainly dry (surrounding flaking skin but no signs of infection)  In area consistent with pressure injury Anticipate difficult to heal due to DM2 /vasc dz and neuropathy (h/o revascularization of LLE in the past) Disc imp of offsetting pressure here with foam (pts wife had a device that may work)  Recommend elevation  Hydrocolloid dressing applied today with instructions Ref to wound clinic-waiting for cancellation to get in         Relevant Orders   AMB referral to wound care center

## 2020-11-07 NOTE — Assessment & Plan Note (Addendum)
Large ulcer on R heel with eschar/mainly dry (surrounding flaking skin but no signs of infection)  In area consistent with pressure injury Anticipate difficult to heal due to DM2 /vasc dz and neuropathy (h/o revascularization of LLE in the past) Disc imp of offsetting pressure here with foam (pts wife had a device that may work)  Recommend elevation  Hydrocolloid dressing applied today with instructions Ref to wound clinic-waiting for cancellation to get in

## 2020-11-07 NOTE — Patient Instructions (Signed)
Leave the current hydrocolloid  dressing on for about 4 days (replicare)   Keep wound clean with soap and water (do not submerge)  Sterile water is best   Try to cushion/off load heel in bed best you can    I placed a referral to wound care and you will get a call   If you note increased redness/swelling/pain -let us know

## 2020-11-09 DIAGNOSIS — J449 Chronic obstructive pulmonary disease, unspecified: Secondary | ICD-10-CM | POA: Diagnosis not present

## 2020-11-09 DIAGNOSIS — R339 Retention of urine, unspecified: Secondary | ICD-10-CM | POA: Diagnosis not present

## 2020-11-09 DIAGNOSIS — J9600 Acute respiratory failure, unspecified whether with hypoxia or hypercapnia: Secondary | ICD-10-CM | POA: Diagnosis not present

## 2020-11-09 DIAGNOSIS — I509 Heart failure, unspecified: Secondary | ICD-10-CM | POA: Diagnosis not present

## 2020-11-09 DIAGNOSIS — R062 Wheezing: Secondary | ICD-10-CM | POA: Diagnosis not present

## 2020-11-11 DIAGNOSIS — M978XXA Periprosthetic fracture around other internal prosthetic joint, initial encounter: Secondary | ICD-10-CM | POA: Diagnosis not present

## 2020-11-11 DIAGNOSIS — Z96649 Presence of unspecified artificial hip joint: Secondary | ICD-10-CM | POA: Diagnosis not present

## 2020-11-11 NOTE — Patient Instructions (Addendum)
November 11, 2020  Dear [name] Small,  It was a pleasure meeting you during our initial appointment on November 11, 2020. Below is a summary of the goals we discussed and components of chronic care management. Please contact me anytime with questions or concerns.   Visit Information  Goals Addressed            This Visit's Progress   . Pharmacy Care Plan       CARE PLAN ENTRY (see longitudinal plan of care for additional care plan information)  Current Barriers:  . Chronic Disease Management support, education, and care coordination needs related to Hyperlipidemia and Diabetes   Hyperlipidemia Lab Results  Component Value Date/Time   LDLCALC 61 10/22/2019 09:11 AM   LDLDIRECT 147.6 04/27/2009 10:12 AM   . Pharmacist Clinical Goal(s): o Over the next 6 months, patient will work with PharmD and providers to maintain LDL goal < 70 . Current regimen:   Atorvastatin (LIPITOR) 10 MG tablet - 1 daily . Interventions: o Reviewed adherence and LDL goals . Patient self care activities - Over the next 6 months, patient will: o Continue medication as prescribed  o Incorporate a healthy diet high in vegetables, fruits and whole grains with low-fat dairy products, chicken, fish, legumes, non-tropical vegetable oils and nuts. Limit intake of sweets, sugar-sweetened beverages and red meats.  Diabetes Lab Results  Component Value Date/Time   HGBA1C 8.6 (H) 07/20/2020 10:30 AM   HGBA1C 9.3 (H) 04/19/2020 04:06 AM   . Pharmacist Clinical Goal(s): o Over the next 6 months, patient will work with PharmD and providers to achieve A1c goal <8% . Current regimen:   Insulin glargine (LANTUS) vial - 10 units twice daily . Interventions: o Discussed option of once weekly Ozempic or similar medication to reduce number of injections . Patient self care activities - Over the next 6 months, patient will: o Check blood sugar once daily, document, and provide at future appointments o Call if you  would like to consider getting a weekly injection through [place] until you [facility]  o Contact provider with any episodes of hypoglycemia  Initial goal documentation       [name] Small was given information about Chronic Care Management services today including:  1. CCM service includes personalized support from designated clinical staff supervised by his physician, including individualized plan of care and coordination with other care providers 2. 24/7 contact phone numbers for assistance for urgent and routine care needs. 3. Standard insurance, coinsurance, copays and deductibles apply for chronic care management only during months in which we provide at least 20 minutes of these services. Most insurances cover these services at 100%, however patients may be responsible for any copay, coinsurance and/or deductible if applicable. This service may help you avoid the need for more expensive face-to-face services. 4. Only one practitioner may furnish and bill the service in a calendar month. 5. The patient may stop CCM services at any time (effective at the end of the month) by phone call to the office staff.  Patient agreed to services and verbal consent obtained.   The patient verbalized understanding of instructions, educational materials, and care plan provided today and declined offer to receive copy of patient instructions, educational materials, and care plan.  Telephone follow up appointment with pharmacy team member scheduled for: 04/16/2021 at [place], PharmD Clinical Pharmacist [facility] [phone]   Hypoglycemia Hypoglycemia is when the sugar (glucose) level  in your blood is too low. Signs of low blood sugar may include:  Feeling: ? Hungry. ? Worried or nervous (anxious). ? Sweaty and clammy. ? Confused. ? Dizzy. ? Sleepy. ? Sick to your stomach (nauseous).  Having: ? A fast heartbeat. ? A headache. ? A change in your  vision. ? Tingling or no feeling (numbness) around your mouth, lips, or tongue. ? Jerky movements that you cannot control (seizure).  Having trouble with: ? Moving (coordination). ? Sleeping. ? Passing out (fainting). ? Getting upset easily (irritability). Low blood sugar can happen to people who have diabetes and people who do not have diabetes. Low blood sugar can happen quickly, and it can be an emergency. Treating low blood sugar Low blood sugar is often treated by eating or drinking something sugary right away, such as:  Fruit juice, 4-6 oz (120-150 mL).  Regular soda (not diet soda), 4-6 oz (120-150 mL).  Low-fat milk, 4 oz (120 mL).  Several pieces of hard candy.  Sugar or honey, 1 Tbsp (15 mL). Treating low blood sugar if you have diabetes If you can think clearly and swallow safely, follow the 15:15 rule:  Take 15 grams of a fast-acting carb (carbohydrate). Talk with your doctor about how much you should take.  Always keep a source of fast-acting carb with you, such as: ? Sugar tablets (glucose pills). Take 3-4 pills. ? 6-8 pieces of hard candy. ? 4-6 oz (120-150 mL) of fruit juice. ? 4-6 oz (120-150 mL) of regular (not diet) soda. ? 1 Tbsp (15 mL) honey or sugar.  Check your blood sugar 15 minutes after you take the carb.  If your blood sugar is still at or below 70 mg/dL (3.9 mmol/L), take 15 grams of a carb again.  If your blood sugar does not go above 70 mg/dL (3.9 mmol/L) after 3 tries, get help right away.  After your blood sugar goes back to normal, eat a meal or a snack within 1 hour.  Treating very low blood sugar If your blood sugar is at or below 54 mg/dL (3 mmol/L), you have very low blood sugar (severe hypoglycemia). This may also cause:  Passing out.  Jerky movements you cannot control (seizure).  Losing consciousness (coma). This is an emergency. Do not wait to see if the symptoms will go away. Get medical help right away. Call your local  emergency services (911 in the [place] Do not drive yourself to the hospital. If you have very low blood sugar and you cannot eat or drink, you may need a glucagon shot (injection). A family member or friend should learn how to check your blood sugar and how to give you a glucagon shot. Ask your doctor if you need to have a glucagon shot kit at home. Follow these instructions at home: General instructions  Take over-the-counter and prescription medicines only as told by your doctor.  Stay aware of your blood sugar as told by your doctor.  Limit alcohol intake to no more than 1 drink a day for nonpregnant women and 2 drinks a day for men. One drink equals 12 oz of beer (355 mL), 5 oz of wine (148 mL), or 1 oz of hard liquor (44 mL).  Keep all follow-up visits as told by your doctor. This is important. If you have diabetes:   Follow your diabetes care plan as told by your doctor. Make sure you: ? Know the signs of low blood sugar. ? Take your medicines as told. ?  Follow your exercise and meal plan. ? Eat on time. Do not skip meals. ? Check your blood sugar as often as told by your doctor. Always check it before and after exercise. ? Follow your sick day plan when you cannot eat or drink normally. Make this plan ahead of time with your doctor.  Share your diabetes care plan with: ? Your work or school. ? People you live with.  Check your pee (urine) for ketones: ? When you are sick. ? As told by your doctor.  Carry a card or wear jewelry that says you have diabetes. Contact a doctor if:  You have trouble keeping your blood sugar in your target range.  You have low blood sugar often. Get help right away if:  You still have symptoms after you eat or drink something sugary.  Your blood sugar is at or below 54 mg/dL (3 mmol/L).  You have jerky movements that you cannot control.  You pass out. These symptoms may be an emergency. Do not wait to see if the symptoms will go away.  Get medical help right away. Call your local emergency services (911 in the [place] Do not drive yourself to the hospital. Summary  Hypoglycemia happens when the level of sugar (glucose) in your blood is too low.  Low blood sugar can happen to people who have diabetes and people who do not have diabetes. Low blood sugar can happen quickly, and it can be an emergency.  Make sure you know the signs of low blood sugar and know how to treat it.  Always keep a source of sugar (fast-acting carb) with you to treat low blood sugar. This information is not intended to replace advice given to you by your health care provider. Make sure you discuss any questions you have with your health care provider. Document Revised: 02/18/2019 Document Reviewed: 12/01/2015 Elsevier Patient Education  2020 [employer].

## 2020-11-13 ENCOUNTER — Encounter: Payer: Self-pay | Admitting: Family Medicine

## 2020-11-13 ENCOUNTER — Encounter (HOSPITAL_BASED_OUTPATIENT_CLINIC_OR_DEPARTMENT_OTHER): Payer: Medicare Other | Admitting: Internal Medicine

## 2020-11-15 ENCOUNTER — Other Ambulatory Visit: Payer: Self-pay | Admitting: Family Medicine

## 2020-11-15 MED ORDER — LOPERAMIDE HCL 2 MG PO TABS: 2.0000 mg | ORAL_TABLET | Freq: Four times a day (QID) | ORAL | Status: DC | PRN

## 2020-11-22 DIAGNOSIS — I509 Heart failure, unspecified: Secondary | ICD-10-CM | POA: Diagnosis not present

## 2020-11-22 DIAGNOSIS — J9600 Acute respiratory failure, unspecified whether with hypoxia or hypercapnia: Secondary | ICD-10-CM | POA: Diagnosis not present

## 2020-11-22 DIAGNOSIS — J449 Chronic obstructive pulmonary disease, unspecified: Secondary | ICD-10-CM | POA: Diagnosis not present

## 2020-11-22 DIAGNOSIS — R062 Wheezing: Secondary | ICD-10-CM | POA: Diagnosis not present

## 2020-12-10 DIAGNOSIS — J449 Chronic obstructive pulmonary disease, unspecified: Secondary | ICD-10-CM | POA: Diagnosis not present

## 2020-12-10 DIAGNOSIS — I509 Heart failure, unspecified: Secondary | ICD-10-CM | POA: Diagnosis not present

## 2020-12-10 DIAGNOSIS — J9600 Acute respiratory failure, unspecified whether with hypoxia or hypercapnia: Secondary | ICD-10-CM | POA: Diagnosis not present

## 2020-12-10 DIAGNOSIS — R062 Wheezing: Secondary | ICD-10-CM | POA: Diagnosis not present

## 2020-12-11 ENCOUNTER — Encounter (HOSPITAL_BASED_OUTPATIENT_CLINIC_OR_DEPARTMENT_OTHER): Payer: Medicare Other | Admitting: Internal Medicine

## 2020-12-11 ENCOUNTER — Other Ambulatory Visit: Payer: Self-pay

## 2020-12-11 ENCOUNTER — Encounter (HOSPITAL_BASED_OUTPATIENT_CLINIC_OR_DEPARTMENT_OTHER): Payer: Medicare Other | Attending: Internal Medicine | Admitting: Internal Medicine

## 2020-12-11 DIAGNOSIS — Z882 Allergy status to sulfonamides status: Secondary | ICD-10-CM | POA: Insufficient documentation

## 2020-12-11 DIAGNOSIS — I130 Hypertensive heart and chronic kidney disease with heart failure and stage 1 through stage 4 chronic kidney disease, or unspecified chronic kidney disease: Secondary | ICD-10-CM | POA: Insufficient documentation

## 2020-12-11 DIAGNOSIS — E1165 Type 2 diabetes mellitus with hyperglycemia: Secondary | ICD-10-CM | POA: Insufficient documentation

## 2020-12-11 DIAGNOSIS — Z833 Family history of diabetes mellitus: Secondary | ICD-10-CM | POA: Diagnosis not present

## 2020-12-11 DIAGNOSIS — E1142 Type 2 diabetes mellitus with diabetic polyneuropathy: Secondary | ICD-10-CM | POA: Diagnosis not present

## 2020-12-11 DIAGNOSIS — I2510 Atherosclerotic heart disease of native coronary artery without angina pectoris: Secondary | ICD-10-CM | POA: Diagnosis not present

## 2020-12-11 DIAGNOSIS — Z87891 Personal history of nicotine dependence: Secondary | ICD-10-CM | POA: Diagnosis not present

## 2020-12-11 DIAGNOSIS — I509 Heart failure, unspecified: Secondary | ICD-10-CM | POA: Insufficient documentation

## 2020-12-11 DIAGNOSIS — Z8249 Family history of ischemic heart disease and other diseases of the circulatory system: Secondary | ICD-10-CM | POA: Insufficient documentation

## 2020-12-11 DIAGNOSIS — Z9049 Acquired absence of other specified parts of digestive tract: Secondary | ICD-10-CM | POA: Diagnosis not present

## 2020-12-11 DIAGNOSIS — Z881 Allergy status to other antibiotic agents status: Secondary | ICD-10-CM | POA: Diagnosis not present

## 2020-12-11 DIAGNOSIS — N183 Chronic kidney disease, stage 3 unspecified: Secondary | ICD-10-CM | POA: Diagnosis not present

## 2020-12-11 DIAGNOSIS — E1151 Type 2 diabetes mellitus with diabetic peripheral angiopathy without gangrene: Secondary | ICD-10-CM | POA: Insufficient documentation

## 2020-12-11 DIAGNOSIS — E1122 Type 2 diabetes mellitus with diabetic chronic kidney disease: Secondary | ICD-10-CM | POA: Insufficient documentation

## 2020-12-11 DIAGNOSIS — Z96649 Presence of unspecified artificial hip joint: Secondary | ICD-10-CM | POA: Insufficient documentation

## 2020-12-11 DIAGNOSIS — Z888 Allergy status to other drugs, medicaments and biological substances status: Secondary | ICD-10-CM | POA: Insufficient documentation

## 2020-12-11 DIAGNOSIS — E11621 Type 2 diabetes mellitus with foot ulcer: Secondary | ICD-10-CM | POA: Insufficient documentation

## 2020-12-11 DIAGNOSIS — Z89412 Acquired absence of left great toe: Secondary | ICD-10-CM | POA: Insufficient documentation

## 2020-12-11 DIAGNOSIS — L97418 Non-pressure chronic ulcer of right heel and midfoot with other specified severity: Secondary | ICD-10-CM | POA: Diagnosis not present

## 2020-12-11 DIAGNOSIS — E785 Hyperlipidemia, unspecified: Secondary | ICD-10-CM | POA: Diagnosis not present

## 2020-12-11 DIAGNOSIS — Z886 Allergy status to analgesic agent status: Secondary | ICD-10-CM | POA: Insufficient documentation

## 2020-12-11 DIAGNOSIS — L89610 Pressure ulcer of right heel, unstageable: Secondary | ICD-10-CM | POA: Diagnosis not present

## 2020-12-12 DIAGNOSIS — M978XXA Periprosthetic fracture around other internal prosthetic joint, initial encounter: Secondary | ICD-10-CM | POA: Diagnosis not present

## 2020-12-12 DIAGNOSIS — Z96649 Presence of unspecified artificial hip joint: Secondary | ICD-10-CM | POA: Diagnosis not present

## 2020-12-13 DIAGNOSIS — S91301A Unspecified open wound, right foot, initial encounter: Secondary | ICD-10-CM | POA: Diagnosis not present

## 2020-12-15 DIAGNOSIS — S91301A Unspecified open wound, right foot, initial encounter: Secondary | ICD-10-CM | POA: Diagnosis not present

## 2020-12-18 ENCOUNTER — Encounter (HOSPITAL_BASED_OUTPATIENT_CLINIC_OR_DEPARTMENT_OTHER): Payer: Medicare Other | Admitting: Internal Medicine

## 2020-12-19 NOTE — Progress Notes (Signed)
[name] Small, [name] Small ([id number]) Visit Report for 12/11/2020 Allergy List Details Patient Name: Date of Service: [name] Small, [name] Small [name] RIO N. 12/11/2020 10:30 A M Medical Record Number: [id number] Patient Account Number: [id number] Date of Birth/Sex: Treating RN: [name] Small (87 y.o. Male) [name] Small Primary Care [name] Small: [name] Small Other Clinician: Referring [name] Small: Treating [name] Small/Extender: [name] Small in Treatment: 0 Allergies Active Allergies ferrous sulfate Severity: Moderate Type: Food Actos Type: Medication aspirin Type: Food pravastatin Type: Food gabapentin Type: Food Bactrim Type: Medication ACE Inhibitors Type: Allergen Lyrica Type: Medication Sulfa (Sulfonamide Antibiotics) Type: Allergen Allergy Notes Electronic Signature(s) Signed: 12/19/2020 1:48:37 PM By: [name] Hammock RN Entered By: [name] Small on 12/11/2020 10:32:25 -------------------------------------------------------------------------------- Arrival Information Details Patient Name: Date of Service: [name] [name] 12/11/2020 10:30 A M Medical Record Number: [id number] Patient Account Number: [id number] Date of Birth/Sex: Treating RN: [name] Small (87 y.o. Male) [name] Small Primary Care Artur Winningham: [name] Small Other Clinician: Referring [name] Small: Treating [name] Small/Extender: [name] Small in Treatment: 0 Visit Information Patient Arrived: Wheel Chair Arrival Time: 10:20 Accompanied By: wife Transfer Assistance: None Patient Identification Verified: Yes Secondary Verification Process Completed: Yes Patient Requires Transmission-Based Precautions: No Patient Has Alerts: No Electronic Signature(s) Signed: 12/19/2020 1:48:37 PM By: [name] Hammock RN Entered By: [name] Small on 12/11/2020 10:20:26 -------------------------------------------------------------------------------- Clinic Level of Care  Assessment Details Patient Name: Date of Service: [name] [name] 12/11/2020 10:30 A M Medical Record Number: [id number] Patient Account Number: [id number] Date of Birth/Sex: Treating RN: [name] Small (87 y.o. Male) [name] Small Primary Care Monisha Siebel: [name] Small Other Clinician: Referring [name] Small: Treating [name] Small/Extender: [name] Small in Treatment: 0 Clinic Level of Care Assessment Items TOOL 1 Quantity Score X- 1 0 Use when EandM and Procedure is performed on INITIAL visit ASSESSMENTS - Nursing Assessment / Reassessment X- 1 20 General Physical Exam (combine w/ comprehensive assessment (listed just below) when performed on new pt. evals) X- 1 25 Comprehensive Assessment (HX, ROS, Risk Assessments, Wounds Hx, etc.) ASSESSMENTS - Wound and Skin Assessment / Reassessment []  - 0 Dermatologic / Skin Assessment (not related to wound area) ASSESSMENTS - Ostomy and/or Continence Assessment and Care []  - 0 Incontinence Assessment and Management []  - 0 Ostomy Care Assessment and Management (repouching, etc.) PROCESS - Coordination of Care X - Simple Patient / Family Education for ongoing care 1 15 []  - 0 Complex (extensive) Patient / Family Education for ongoing care X- 1 10 Staff obtains Programmer, systems, Records, T Results / Process Orders est X- 1 10 Staff telephones HHA, Nursing Homes / Clarify orders / etc []  - 0 Routine Transfer to another Facility (non-emergent condition) []  - 0 Routine Hospital Admission (non-emergent condition) X- 1 15 New Admissions / Biomedical engineer / Ordering NPWT Apligraf, etc. , []  - 0 Emergency Hospital Admission (emergent condition) PROCESS - Special Needs []  - 0 Pediatric / Minor Patient Management []  - 0 Isolation Patient Management []  - 0 Hearing / Language / Visual special needs []  - 0 Assessment of Community assistance (transportation, D/C planning, etc.) []  - 0 Additional assistance /  Altered mentation []  - 0 Support Surface(s) Assessment (bed, cushion, seat, etc.) INTERVENTIONS - Miscellaneous []  - 0 External ear exam []  - 0 Patient Transfer (multiple staff / Civil Service fast streamer / Similar devices) []  - 0 Simple Staple / Suture removal (25 or less) []  - 0 Complex Staple / Suture removal (26 or more) []  - 0 Hypo/Hyperglycemic Management (do not check  if billed separately) X- 1 15 Ankle / Brachial Index (ABI) - do not check if billed separately Has the patient been seen at the hospital within the last three years: Yes Total Score: 110 Level Of Care: New/Established - Level 3 Electronic Signature(s) Signed: 12/11/2020 5:05:12 PM By: [name] Hurst RN, BSN Entered By: [name] Small on 12/11/2020 11:56:22 -------------------------------------------------------------------------------- Encounter Discharge Information Details Patient Name: Date of Service: [name] [name] 12/11/2020 10:30 A M Medical Record Number: [id number] Patient Account Number: [id number] Date of Birth/Sex: Treating RN: [name] Small (87 y.o. Male) [name] Small Primary Care Sabrinia Prien: [name] Small Other Clinician: Referring Lexine Jaspers: Treating Adreanna Fickel/Extender: [name] Small in Treatment: 0 Encounter Discharge Information Items Post Procedure Vitals Discharge Condition: Stable Temperature (F): 97.6 Ambulatory Status: Wheelchair Pulse (bpm): 91 Discharge Destination: Home Respiratory Rate (breaths/min): 17 Transportation: Private Auto Blood Pressure (mmHg): 105/59 Accompanied By: wife Schedule Follow-up Appointment: Yes Clinical Summary of Care: Electronic Signature(s) Signed: 12/11/2020 12:44:42 PM By: [name] Small Entered By: [name] Small on 12/11/2020 12:14:48 -------------------------------------------------------------------------------- Lower Extremity Assessment Details Patient Name: Date of Service: [name] Small, [name] Small [name] RIO N. 12/11/2020 10:30 A M Medical  Record Number: [id number] Patient Account Number: [id number] Date of Birth/Sex: Treating RN: [name] Small (87 y.o. Male) [name] Small Primary Care Chisom Muntean: [name] Small Other Clinician: Referring Psalm Arman: Treating Kinley Ferrentino/Extender: [name] Small in Treatment: 0 Edema Assessment Assessed: Shirlyn Goltz: No] Patrice Paradise: Yes] Edema: [Left: N] [Right: o] Calf Left: Right: Point of Measurement: 33 cm From Medial Instep 33 cm Ankle Left: Right: Point of Measurement: 12 cm From Medial Instep 21 cm Knee To Floor Left: Right: From Medial Instep 47 cm Vascular Assessment Pulses: Dorsalis Pedis Palpable: [Right:Yes] Posterior Tibial Palpable: [Right:Yes] Blood Pressure: Brachial: [Right:105] Ankle: [Right:Dorsalis Pedis: 98 0.93] Electronic Signature(s) Signed: 12/19/2020 1:48:37 PM By: [name] Hammock RN Entered By: [name] Small on 12/11/2020 11:00:51 -------------------------------------------------------------------------------- Multi Wound Chart Details Patient Name: Date of Service: [name] [name] 12/11/2020 10:30 A M Medical Record Number: [id number] Patient Account Number: [id number] Date of Birth/Sex: Treating RN: [birth year] (87 y.o. Male) [name] Small Primary Care Alvaretta Eisenberger: [name] Small Other Clinician: Referring Jamail Cullers: Treating Giovanna Kemmerer/Extender: [name] Small in Treatment: 0 Vital Signs Height(in): 74 Capillary Blood Glucose(mg/dl): 55 Weight(lbs): 205 Pulse(bpm): 72 Body Mass Index(BMI): 26 Blood Pressure(mmHg): 105/59 Temperature(F): 97.6 Respiratory Rate(breaths/min): 17 Photos: [1:No Photos Right Calcaneus] [N/A:N/A N/A] Wound Location: [1:Pressure Injury] [N/A:N/A] Wounding Event: [1:Diabetic Wound/Ulcer of the Lower] [N/A:N/A] Primary Etiology: [1:Extremity Pressure Ulcer] [N/A:N/A] Secondary Etiology: [1:Chronic Obstructive Pulmonary] [N/A:N/A] Comorbid History:  [1:Disease (COPD), Congestive Heart Failure, Coronary Artery Disease, Hypertension, Peripheral Arterial Disease, Type II Diabetes, Neuropathy 08/11/2020] [N/A:N/A] Date Acquired: [1:0] [N/A:N/A] Small of Treatment: [1:Open] [N/A:N/A] Wound Status: [1:2.1x4.4x0.1] [N/A:N/A] Measurements L x W x D (cm) [1:7.257] [N/A:N/A] A (cm) : rea [1:0.726] [N/A:N/A] Volume (cm) : [1:0.00%] [N/A:N/A] % Reduction in A rea: [1:0.00%] [N/A:N/A] % Reduction in Volume: [1:Unable to visualize wound bed] [N/A:N/A] Classification: [1:Medium] [N/A:N/A] Exudate A mount: [1:Serosanguineous] [N/A:N/A] Exudate Type: [1:red, brown] [N/A:N/A] Exudate Color: [1:Distinct, outline attached] [N/A:N/A] Wound Margin: [1:None Present (0%)] [N/A:N/A] Granulation Amount: [1:Large (67-100%)] [N/A:N/A] Necrotic Amount: [1:Eschar, Adherent Slough] [N/A:N/A] Necrotic Tissue: [1:Fascia: No] [N/A:N/A] Exposed Structures: [1:Fat Layer (Subcutaneous Tissue): No Tendon: No Muscle: No Joint: No Bone: No None] [N/A:N/A] Epithelialization: [1:Debridement - Excisional] [N/A:N/A] Debridement: Pre-procedure Verification/Time Out 11:19 [N/A:N/A] Taken: [1:Other] [N/A:N/A] Pain Control: [1:Necrotic/Eschar, Subcutaneous,] [N/A:N/A] Tissue Debrided: [1:Slough Skin/Subcutaneous Tissue] [N/A:N/A] Level: [1:9.24] [N/A:N/A] Debridement A (sq cm): [  1:rea Curette] [N/A:N/A] Instrument: [1:Minimum] [N/A:N/A] Bleeding: [1:Pressure] [N/A:N/A] Hemostasis Achieved: [1:0] [N/A:N/A] Procedural Pain: [1:0] [N/A:N/A] Post Procedural Pain: Debridement Treatment Response: Procedure was tolerated well [N/A:N/A] Post Debridement Measurements L x 2.1x4.4x0.1 [N/A:N/A] W x D (cm) [1:0.726] [N/A:N/A] Post Debridement Volume: (cm) [1:Debridement] [N/A:N/A] Treatment Notes Electronic Signature(s) Signed: 12/11/2020 5:05:12 PM By: [name] Hurst RN, BSN Entered By: [name] Small on 12/11/2020  11:42:25 -------------------------------------------------------------------------------- Multi-Disciplinary Care Plan Details Patient Name: Date of Service: [name] [name] 12/11/2020 10:30 A M Medical Record Number: [id number] Patient Account Number: [id number] Date of Birth/Sex: Treating RN: [birth year]-Jul-Small (87 y.o. Male) [name] Small Primary Care Cruzita Lipa: Leonides Small Other Clinician: Referring Lorenda Grecco: Treating Jaydalee Bardwell/Extender: [name] Small in Treatment: 0 Active Inactive Abuse / Safety / Falls / Self Care Management Nursing Diagnoses: History of Falls Impaired physical mobility Potential for injury related to falls Goals: Patient will not experience any injury related to falls Date Initiated: 12/11/2020 Target Resolution Date: 01/12/2021 Goal Status: Active Patient/caregiver will verbalize/demonstrate measures taken to prevent injury and/or falls Date Initiated: 12/11/2020 Target Resolution Date: 01/12/2021 Goal Status: Active Interventions: Assess Activities of Daily Living upon admission and as needed Assess fall risk on admission and as needed Assess: immobility, friction, shearing, incontinence upon admission and as needed Assess impairment of mobility on admission and as needed per policy Patient/Caregiver referred to community resources (specify in notes) Provide education on fall prevention Provide education on personal and home safety Notes: Nutrition Nursing Diagnoses: Impaired glucose control: actual or potential Potential for alteratiion in Nutrition/Potential for imbalanced nutrition Goals: Patient/caregiver agrees to and verbalizes understanding of need to use nutritional supplements and/or vitamins as prescribed Date Initiated: 12/11/2020 Target Resolution Date: 01/12/2021 Goal Status: Active Patient/caregiver will maintain therapeutic glucose control Date Initiated: 12/11/2020 Target Resolution Date: 01/12/2021 Goal  Status: Active Interventions: Assess HgA1c results as ordered upon admission and as needed Assess patient nutrition upon admission and as needed per policy Provide education on elevated blood sugars and impact on wound healing Provide education on nutrition Notes: Pressure Nursing Diagnoses: Knowledge deficit related to causes and risk factors for pressure ulcer development Knowledge deficit related to management of pressures ulcers Potential for impaired tissue integrity related to pressure, friction, moisture, and shear Goals: Patient/caregiver will verbalize risk factors for pressure ulcer development Date Initiated: 12/11/2020 Target Resolution Date: 01/12/2021 Goal Status: Active Patient/caregiver will verbalize understanding of pressure ulcer management Date Initiated: 12/11/2020 Target Resolution Date: 01/12/2021 Goal Status: Active Interventions: Assess: immobility, friction, shearing, incontinence upon admission and as needed Assess offloading mechanisms upon admission and as needed Assess potential for pressure ulcer upon admission and as needed Provide education on pressure ulcers Notes: Wound/Skin Impairment Nursing Diagnoses: Impaired tissue integrity Knowledge deficit related to ulceration/compromised skin integrity Goals: Patient/caregiver will verbalize understanding of skin care regimen Date Initiated: 12/11/2020 Target Resolution Date: 01/12/2021 Goal Status: Active Ulcer/skin breakdown will have a volume reduction of 30% by week 4 Date Initiated: 12/11/2020 Target Resolution Date: 01/12/2021 Goal Status: Active Interventions: Assess patient/caregiver ability to obtain necessary supplies Assess patient/caregiver ability to perform ulcer/skin care regimen upon admission and as needed Assess ulceration(s) every visit Provide education on ulcer and skin care Notes: Electronic Signature(s) Signed: 12/11/2020 5:05:12 PM By: [name] Hurst RN, BSN Entered By: [name] Small on 12/11/2020 11:15:27 -------------------------------------------------------------------------------- Pain Assessment Details Patient Name: Date of Service: [name] [name] 12/11/2020 10:30 A M Medical Record Number: [id number] Patient Account Number: [id number] Date of Birth/Sex: Treating RN: [birth year]-Small (87 y.o.  Male) [name] Small Primary Care Josef Tourigny: [name] Small Other Clinician: Referring Harish Bram: Treating Fidencio Duddy/Extender: [name] Small in Treatment: 0 Active Problems Location of Pain Severity and Description of Pain Patient Has Paino No Site Locations Rate the pain. Current Pain Level: 0 Pain Management and Medication Current Pain Management: Electronic Signature(s) Signed: 12/19/2020 1:48:37 PM By: [name] Hammock RN Entered By: [name] Small on 12/11/2020 10:23:30 -------------------------------------------------------------------------------- Patient/Caregiver Education Details Patient Name: Date of Service: [name] Small 1/31/2022andnbsp10:30 A M Medical Record Number: [id number] Patient Account Number: [id number] Date of Birth/Gender: Treating RN: [birth year] (87 y.o. Male) [name] Small Primary Care Physician: [name] Small Other Clinician: Referring Physician: Treating Physician/Extender: Veneta Penton in Treatment: 0 Education Assessment Education Provided To: Patient Education Topics Provided Elevated Blood Sugar/ Impact on Healing: Methods: Explain/Verbal Responses: State content correctly Nutrition: Methods: Explain/Verbal Responses: State content correctly Pressure: Handouts: Pressure Ulcers: Care and Offloading Methods: Explain/Verbal Responses: State content correctly Wound/Skin Impairment: Methods: Explain/Verbal Responses: State content correctly Electronic Signature(s) Signed: 12/11/2020 5:05:12 PM By: [name] Hurst RN, BSN Entered By:  [name] Small on 12/11/2020 11:16:47 -------------------------------------------------------------------------------- Wound Assessment Details Patient Name: Date of Service: [name] [name] 12/11/2020 10:30 A M Medical Record Number: [id number] Patient Account Number: [id number] Date of Birth/Sex: Treating RN: [birth year]Small (87 y.o. Male) [name] Small Primary Care Kaeo Jacome: [name] Small Other Clinician: Referring Jayleen Afonso: Treating Rhiley Tarver/Extender: [name] Small in Treatment: 0 Wound Status Wound Number: 1 Primary Diabetic Wound/Ulcer of the Lower Extremity Etiology: Wound Location: Right Calcaneus Secondary Pressure Ulcer Wounding Event: Pressure Injury Etiology: Date Acquired: 08/11/2020 Wound Open Small Of Treatment: 0 Status: Clustered Wound: No Comorbid Chronic Obstructive Pulmonary Disease (COPD), Congestive History: Heart Failure, Coronary Artery Disease, Hypertension, Peripheral Arterial Disease, Type II Diabetes, Neuropathy Photos Photo Uploaded By: Mikeal Hawthorne on 12/14/2020 08:40:16 Wound Measurements Length: (cm) 2.1 Width: (cm) 4.4 Depth: (cm) 0.1 Area: (cm) 7.257 Volume: (cm) 0.726 % Reduction in Area: 0% % Reduction in Volume: 0% Epithelialization: None Tunneling: No Undermining: No Wound Description Classification: Unable to visualize wound bed Wound Margin: Distinct, outline attached Exudate Amount: Medium Exudate Type: Serosanguineous Exudate Color: red, brown Foul Odor After Cleansing: No Slough/Fibrino Yes Wound Bed Granulation Amount: None Present (0%) Exposed Structure Necrotic Amount: Large (67-100%) Fascia Exposed: No Necrotic Quality: Eschar, Adherent Slough Fat Layer (Subcutaneous Tissue) Exposed: No Tendon Exposed: No Muscle Exposed: No Joint Exposed: No Bone Exposed: No Treatment Notes Wound #1 (Calcaneus) Wound Laterality: Right Cleanser Soap and Water Discharge Instruction: May  shower and wash wound with dial antibacterial soap and water prior to dressing change. Wound Cleanser Discharge Instruction: Cleanse the wound with wound cleanser prior to applying a clean dressing using gauze sponges, not tissue or cotton balls. Peri-Wound Care Topical Primary Dressing Santyl Ointment Discharge Instruction: Apply nickel thick amount to wound bed as instructed Secondary Dressing Woven Gauze Sponge, Non-Sterile 4x4 in Discharge Instruction: Apply over primary dressing as directed. ALLEVYN Heel 4 1/2in x 5 1/2in / 10.5cm x 13.5cm Discharge Instruction: Apply over primary dressing as directed. Secured With [employer], 4.5x3.1 (in/yd) Discharge Instruction: Secure with Kerlix as directed. Paper Tape, 2x10 (in/yd) Discharge Instruction: Secure dressing with tape as directed. Compression Wrap Compression Stockings Add-Ons Notes Educated wife on how to apply dressings, frequency of change, ordering home health, and when return to wound center. Wife in agreement. Electronic Signature(s) Signed: 12/11/2020 5:05:12 PM By: [name] Hurst RN, BSN Signed: 12/19/2020 1:48:37 PM  By: [name] Hammock RN Entered By: [name] Small on 12/11/2020 11:42:12 -------------------------------------------------------------------------------- Vitals Details Patient Name: Date of Service: [name] [name] 12/11/2020 10:30 A M Medical Record Number: [id number] Patient Account Number: [id number] Date of Birth/Sex: Treating RN: 09-30-Small (87 y.o. Male) [name] Small Primary Care Leeanna Slaby: [name] Small Other Clinician: Referring Jaidon Sponsel: Treating Meshach Perry/Extender: [name] Small in Treatment: 0 Vital Signs Time Taken: 10:20 Temperature (F): 97.6 Height (in): 74 Pulse (bpm): 91 Source: Stated Respiratory Rate (breaths/min): 17 Weight (lbs): 205 Blood Pressure (mmHg): 105/59 Source: Stated Capillary Blood Glucose (mg/dl): 78 Body  Mass Index (BMI): 26.3 Reference Range: 80 - 120 mg / dl Electronic Signature(s) Signed: 12/19/2020 1:48:37 PM By: [name] Hammock RN Entered By: [name] Small on 12/11/2020 10:21:58

## 2020-12-19 NOTE — Progress Notes (Signed)
REYLI, SCHROTH ([id number]) Visit Report for 12/11/2020 Chief Complaint Document Details Patient Name: Date of Service: [name] Small, [name] Small [name] RIO N. 12/11/2020 10:30 A M Medical Record Number: [id number] Patient Account Number: [id number] Date of Birth/Sex: Treating RN: [birth year] (87 y.o. Male) Levan Hurst Primary Care Provider: Leonides Cave Other Clinician: Referring Provider: Treating Provider/Extender: Lina Sayre, Purcell Mouton Weeks in Treatment: 0 Information Obtained from: Patient Chief Complaint 12/11/2020; patient is here for evaluation of a necrotic wound on the tip of his right heel Electronic Signature(s) Signed: 12/11/2020 4:50:22 PM By: Linton Ham MD Entered By: Linton Ham on 12/11/2020 11:40:33 -------------------------------------------------------------------------------- Debridement Details Patient Name: Date of Service: [name] [name] 12/11/2020 10:30 A M Medical Record Number: [id number] Patient Account Number: [id number] Date of Birth/Sex: Treating RN: December 12, 1927 (87 y.o. Male) Levan Hurst Primary Care Provider: Leonides Cave Other Clinician: Referring Provider: Treating Provider/Extender: Earnest Conroy Weeks in Treatment: 0 Debridement Performed for Assessment: Wound #1 Right Calcaneus Performed By: Physician Ricard Dillon., MD Debridement Type: Debridement Level of Consciousness (Pre-procedure): Awake and Alert Pre-procedure Verification/Time Out Yes - 11:19 Taken: Start Time: 11:19 Pain Control: Other : Benzocaine 20% T Area Debrided (L x W): otal 2.1 (cm) x 4.4 (cm) = 9.24 (cm) Tissue and other material debrided: Viable, Non-Viable, Eschar, Slough, Subcutaneous, Slough Level: Skin/Subcutaneous Tissue Debridement Description: Excisional Instrument: Curette Bleeding: Minimum Hemostasis Achieved: Pressure End Time: 11:21 Procedural Pain: 0 Post Procedural Pain: 0 Response to Treatment:  Procedure was tolerated well Level of Consciousness (Post- Awake and Alert procedure): Post Debridement Measurements of Total Wound Length: (cm) 2.1 Stage: Unstageable/Unclassified Width: (cm) 4.4 Depth: (cm) 0.1 Volume: (cm) 0.726 Character of Wound/Ulcer Post Debridement: Requires Further Debridement Post Procedure Diagnosis Same as Pre-procedure Electronic Signature(s) Signed: 12/11/2020 4:50:22 PM By: Linton Ham MD Signed: 12/11/2020 5:05:12 PM By: Levan Hurst RN, BSN Entered By: Linton Ham on 12/11/2020 11:40:08 -------------------------------------------------------------------------------- HPI Details Patient Name: Date of Service: [name] [name] 12/11/2020 10:30 A M Medical Record Number: [id number] Patient Account Number: [id number] Date of Birth/Sex: Treating RN: 11/13/1927 (87 y.o. Male) Levan Hurst Primary Care Provider: Leonides Cave Other Clinician: Referring Provider: Treating Provider/Extender: Earnest Conroy Weeks in Treatment: 0 History of Present Illness HPI Description: ADMISSION 12/11/2020 This is a 87 year old man who apparently fractured his left hip in June and then had a subsequent fall and femur fracture requiring a separate surgery. He spent a long time at rehab at [facility] skilled facility. Sometime in October his wife noted a blister on the left heel that is gradually morphed into a pressure ulcer. He saw his primary physician on 11/07/2020 and he was referred here. The patient had prior problems with PAD on the left. His last arterial studies in September 2020 showed an ABI on the right of 1.03 with a TBI of 0.73 and biphasic waveforms on the left a ABI of 0.84, TBI's were not done because the toe was surgically absent. He had monophasic and biphasic waveforms. In 2017 he had an angiogram showing a mom diffusely diseased SFA and occluded left popliteal he underwent a left popliteal and left peroneal artery  angioplasty. Noteworthy on the LEFT the peroneal was the only patent vessel below the knee. I do not think his right side was actually studied Past medical history; type 2 diabetes on insulin with peripheral neuropathy, hypertension, hyperlipidemia, hip fracture and subsequent femur fracture is noted, and emphysema, history of bladder cancer requiring in and  out catheterizations, coronary artery disease, BPH. Updated ABI in our clinic was 0.93 on the right Electronic Signature(s) Signed: 12/11/2020 4:50:22 PM By: Linton Ham MD Entered By: Linton Ham on 12/11/2020 11:57:37 -------------------------------------------------------------------------------- Physical Exam Details Patient Name: Date of Service: [name] [name] 12/11/2020 10:30 A M Medical Record Number: [id number] Patient Account Number: [id number] Date of Birth/Sex: Treating RN: 12-02-1927 (87 y.o. Male) Levan Hurst Primary Care Provider: Leonides Cave Other Clinician: Referring Provider: Treating Provider/Extender: Earnest Conroy Weeks in Treatment: 0 Constitutional Patient is hypertensive.. Pulse regular and within target range for patient.Marland Kitchen Respirations regular, non-labored and within target range.. Temperature is normal and within the target range for the patient.Marland Kitchen Appears in no distress. Respiratory work of breathing is normal. Cardiovascular Palpable at the popliteal. Pedal pulses were palpable at the right dorsalis pedis and posterior tibial. Integumentary (Hair, Skin) There is no erythema around the wound. Psychiatric Patient speaks sparingly but did respond to commands. Notes Wound exam; the areas on the right heel on the Achilles tip. Completely nonviable surface with necrotic debris. I used a #5 curette to aggressively debride this down to a reasonably healthy surface. Removing large amounts of necrotic subcutaneous tissue. There is no exposed bone. No purulence no  surrounding erythema no cultures were done Electronic Signature(s) Signed: 12/11/2020 4:50:22 PM By: Linton Ham MD Entered By: Linton Ham on 12/11/2020 11:59:14 -------------------------------------------------------------------------------- Physician Orders Details Patient Name: Date of Service: [name] [name] 12/11/2020 10:30 A M Medical Record Number: [id number] Patient Account Number: [id number] Date of Birth/Sex: Treating RN: 1927/12/08 (87 y.o. Male) Levan Hurst Primary Care Provider: Leonides Cave Other Clinician: Referring Provider: Treating Provider/Extender: Earnest Conroy Weeks in Treatment: 0 Verbal / Phone Orders: No Diagnosis Coding Follow-up Appointments Return Appointment in 1 week. Bathing/ Shower/ Hygiene May shower and wash wound with soap and water. - prior to dressing change Off-Loading Heel suspension boot to: - Gloped heel offloading sandal to right foot Turn and reposition every 2 hours Other: - float heels off of bed and chair by placing pillow under calves [place] to [place] for wound care. May utilize formulary equivalent dressing for wound treatment orders unless otherwise specified. - A skilled nursing 2-3 times a week for wound care, has cg in home to change dressing all other days during the week Wound Treatment Wound #1 - Calcaneus Wound Laterality: Right Cleanser: Soap and Water 1 x Per Day/7 Days Discharge Instructions: May shower and wash wound with dial antibacterial soap and water prior to dressing change. Cleanser: Wound Cleanser (DME) (Generic) 1 x Per Day/7 Days Discharge Instructions: Cleanse the wound with wound cleanser prior to applying a clean dressing using gauze sponges, not tissue or cotton balls. Prim Dressing: Santyl Ointment 1 x Per Day/7 Days ary Discharge Instructions: Apply nickel thick amount to wound bed as instructed Secondary Dressing: Woven Gauze Sponge,  Non-Sterile 4x4 in (DME) (Generic) 1 x Per Day/7 Days Discharge Instructions: Apply over primary dressing as directed. Secondary Dressing: ALLEVYN Heel 4 1/2in x 5 1/2in / 10.5cm x 13.5cm (DME) (Generic) 1 x Per Day/7 Days Discharge Instructions: Apply over primary dressing as directed. Secured With: [employer], 4.5x3.1 (in/yd) (DME) (Generic) 1 x Per Day/7 Days Discharge Instructions: Secure with Kerlix as directed. Secured With: Paper Tape, 2x10 (in/yd) (DME) (Generic) 1 x Per Day/7 Days Discharge Instructions: Secure dressing with tape as directed. Patient Medications llergies: ferrous sulfate, Actos, aspirin, pravastatin, gabapentin, Bactrim, ACE  Inhibitors, Lyrica, Sulfa (Sulfonamide Antibiotics) A Notifications Medication Indication Start End right heel ulcer 12/11/2020 Santyl DOSE topical 250 unit/gram ointment - ointment topical to wound daily Electronic Signature(s) Signed: 12/11/2020 11:42:37 AM By: Linton Ham MD Entered By: Linton Ham on 12/11/2020 11:42:37 -------------------------------------------------------------------------------- Problem List Details Patient Name: Date of Service: [name] [name] 12/11/2020 10:30 A M Medical Record Number: [id number] Patient Account Number: [id number] Date of Birth/Sex: Treating RN: 1928/03/19 (87 y.o. Male) Levan Hurst Primary Care Provider: Leonides Cave Other Clinician: Referring Provider: Treating Provider/Extender: Lina Sayre, Purcell Mouton Weeks in Treatment: 0 Active Problems ICD-10 Encounter Code Description Active Date MDM Diagnosis E11.621 Type 2 diabetes mellitus with foot ulcer 12/11/2020 No Yes L97.418 Non-pressure chronic ulcer of right heel and midfoot with other specified 12/11/2020 No Yes severity E11.51 Type 2 diabetes mellitus with diabetic peripheral angiopathy without gangrene 12/11/2020 No Yes E11.42 Type 2 diabetes mellitus with diabetic polyneuropathy 12/11/2020 No  Yes Inactive Problems Resolved Problems Electronic Signature(s) Signed: 12/11/2020 4:50:22 PM By: Linton Ham MD Entered By: Linton Ham on 12/11/2020 11:39:03 -------------------------------------------------------------------------------- Progress Note Details Patient Name: Date of Service: [name] [name] 12/11/2020 10:30 A M Medical Record Number: [id number] Patient Account Number: [id number] Date of Birth/Sex: Treating RN: 1928-09-28 (87 y.o. Male) Levan Hurst Primary Care Provider: Leonides Cave Other Clinician: Referring Provider: Treating Provider/Extender: Lina Sayre, Purcell Mouton Weeks in Treatment: 0 Subjective Chief Complaint Information obtained from Patient 12/11/2020; patient is here for evaluation of a necrotic wound on the tip of his right heel History of Present Illness (HPI) ADMISSION 12/11/2020 This is a 87 year old man who apparently fractured his left hip in June and then had a subsequent fall and femur fracture requiring a separate surgery. He spent a long time at rehab at [facility] skilled facility. Sometime in October his wife noted a blister on the left heel that is gradually morphed into a pressure ulcer. He saw his primary physician on 11/07/2020 and he was referred here. The patient had prior problems with PAD on the left. His last arterial studies in September 2020 showed an ABI on the right of 1.03 with a TBI of 0.73 and biphasic waveforms on the left a ABI of 0.84, TBI's were not done because the toe was surgically absent. He had monophasic and biphasic waveforms. In 2017 he had an angiogram showing a mom diffusely diseased SFA and occluded left popliteal he underwent a left popliteal and left peroneal artery angioplasty. Noteworthy on the LEFT the peroneal was the only patent vessel below the knee. I do not think his right side was actually studied Past medical history; type 2 diabetes on insulin with peripheral neuropathy,  hypertension, hyperlipidemia, hip fracture and subsequent femur fracture is noted, and emphysema, history of bladder cancer requiring in and out catheterizations, coronary artery disease, BPH. Updated ABI in our clinic was 0.93 on the right Patient History Information obtained from Patient. Allergies ferrous sulfate (Severity: Moderate), Actos, aspirin, pravastatin, gabapentin, Bactrim, ACE Inhibitors, Lyrica, Sulfa (Sulfonamide Antibiotics) Family History Diabetes - Mother, Heart Disease - Siblings, No family history of Cancer, Hereditary Spherocytosis, Hypertension, Kidney Disease, Lung Disease, Seizures, Stroke, Thyroid Problems, Tuberculosis. Social History Former smoker, Marital Status - Married, Alcohol Use - Rarely, Drug Use - No History, Caffeine Use - Rarely. Medical History Eyes Denies history of Cataracts, Glaucoma, Optic Neuritis Ear/Nose/Mouth/Throat Denies history of Chronic sinus problems/congestion, Middle ear problems Hematologic/Lymphatic Denies history of Anemia, Hemophilia, Human Immunodeficiency Virus, Lymphedema, Sickle Cell Disease Respiratory  Patient has history of Chronic Obstructive Pulmonary Disease (COPD) Denies history of Aspiration, Asthma, Pneumothorax, Sleep Apnea, Tuberculosis Cardiovascular Patient has history of Congestive Heart Failure, Coronary Artery Disease, Hypertension, Peripheral Arterial Disease Denies history of Angina, Arrhythmia, Deep Vein Thrombosis, Hypotension, Myocardial Infarction, Peripheral Venous Disease, Phlebitis, Vasculitis Gastrointestinal Denies history of Cirrhosis , Colitis, Crohnoos, Hepatitis A, Hepatitis B, Hepatitis C Endocrine Patient has history of Type II Diabetes Denies history of Type I Diabetes Genitourinary Denies history of End Stage Renal Disease Immunological Denies history of Lupus Erythematosus, Raynaudoos, Scleroderma Integumentary (Skin) Denies history of History of Burn Musculoskeletal Denies  history of Gout, Rheumatoid Arthritis, Osteoarthritis, Osteomyelitis Neurologic Patient has history of Neuropathy Denies history of Dementia, Quadriplegia, Paraplegia, Seizure Disorder Oncologic Denies history of Received Chemotherapy, Received Radiation Blood sugar is tested. Hospitalization/Surgery History - transmet amp left foot. - appendectomy. - biopsy. - partial first amputation ray; left. - cardiac catheterization. - femur IM nail. - lumbar laminectomy/ decompression. - ORIF femur fracture. - penile prosthesis implant. - peripheral vascular catheterization. - total hip arthroplasty. - transurethral resection of bladder tumor. Medical A Surgical History Notes nd Respiratory 2L [place] at nighttime Genitourinary chronic kidney disease stage 3, BPH, pt. gets catheterized daily 5-6 x d/t inability to urinate Oncologic hx of bladder cancer Review of Systems (ROS) Constitutional Symptoms (General Health) Complains or has symptoms of Fatigue. Denies complaints or symptoms of Fever, Chills, Marked Weight Change. Eyes Denies complaints or symptoms of Dry Eyes, Vision Changes, Glasses / Contacts. Ear/Nose/Mouth/Throat Denies complaints or symptoms of Chronic sinus problems or rhinitis. Respiratory Denies complaints or symptoms of Chronic or frequent coughs, Shortness of Breath. Cardiovascular Denies complaints or symptoms of Chest pain. Gastrointestinal Denies complaints or symptoms of Frequent diarrhea, Nausea, Vomiting. Endocrine Denies complaints or symptoms of Heat/cold intolerance. Genitourinary Denies complaints or symptoms of Frequent urination. Integumentary (Skin) Denies complaints or symptoms of Wounds. Musculoskeletal Denies complaints or symptoms of Muscle Pain, Muscle Weakness. Neurologic Denies complaints or symptoms of Numbness/parasthesias. Psychiatric Denies complaints or symptoms of Claustrophobia, Suicidal. Objective Constitutional Patient is hypertensive..  Pulse regular and within target range for patient.Marland Kitchen Respirations regular, non-labored and within target range.. Temperature is normal and within the target range for the patient.Marland Kitchen Appears in no distress. Vitals Time Taken: 10:20 AM, Height: 74 in, Source: Stated, Weight: 205 lbs, Source: Stated, BMI: 26.3, Temperature: 97.6 F, Pulse: 91 bpm, Respiratory Rate: 17 breaths/min, Blood Pressure: 105/59 mmHg, Capillary Blood Glucose: 78 mg/dl. Respiratory work of breathing is normal. Cardiovascular Palpable at the popliteal. Pedal pulses were palpable at the right dorsalis pedis and posterior tibial. Psychiatric Patient speaks sparingly but did respond to commands. General Notes: Wound exam; the areas on the right heel on the Achilles tip. Completely nonviable surface with necrotic debris. I used a #5 curette to aggressively debride this down to a reasonably healthy surface. Removing large amounts of necrotic subcutaneous tissue. There is no exposed bone. No purulence no surrounding erythema no cultures were done Integumentary (Hair, Skin) There is no erythema around the wound. Wound #1 status is Open. Original cause of wound was Pressure Injury. The wound is located on the Right Calcaneus. The wound measures 2.1cm length x 4.4cm width x 0.1cm depth; 7.257cm^2 area and 0.726cm^3 volume. There is no tunneling or undermining noted. There is a medium amount of serosanguineous drainage noted. The wound margin is distinct with the outline attached to the wound base. There is no granulation within the wound bed. There is a large (67- 100%) amount of necrotic  tissue within the wound bed including Eschar and Adherent Slough. Assessment Active Problems ICD-10 Type 2 diabetes mellitus with foot ulcer Non-pressure chronic ulcer of right heel and midfoot with other specified severity Type 2 diabetes mellitus with diabetic peripheral angiopathy without gangrene Type 2 diabetes mellitus with diabetic  polyneuropathy Procedures Wound #1 Pre-procedure diagnosis of Wound #1 is a Pressure Ulcer located on the Right Calcaneus . There was a Excisional Skin/Subcutaneous Tissue Debridement with a total area of 9.24 sq cm performed by Ricard Dillon., MD. With the following instrument(s): Curette to remove Viable and Non-Viable tissue/material. Material removed includes Eschar, Subcutaneous Tissue, and Slough after achieving pain control using Other (Benzocaine 20%). No specimens were taken. A time out was conducted at 11:19, prior to the start of the procedure. A Minimum amount of bleeding was controlled with Pressure. The procedure was tolerated well with a pain level of 0 throughout and a pain level of 0 following the procedure. Post Debridement Measurements: 2.1cm length x 4.4cm width x 0.1cm depth; 0.726cm^3 volume. Post debridement Stage noted as Unstageable/Unclassified. Character of Wound/Ulcer Post Debridement requires further debridement. Post procedure Diagnosis Wound #1: Same as Pre-Procedure Plan Follow-up Appointments: Return Appointment in 1 week. Bathing/ Shower/ Hygiene: May shower and wash wound with soap and water. - prior to dressing change Off-Loading: Heel suspension boot to: - Gloped heel offloading sandal to right foot Turn and reposition every 2 hours Other: - float heels off of bed and chair by placing pillow under calves Home Health: Admit to [facility] for wound care. May utilize formulary equivalent dressing for wound treatment orders unless otherwise specified. - skilled nursing 2-3 times a week for wound care, has cg in home to change dressing all other days during the week The following medication(s) was prescribed: Santyl topical 250 unit/gram ointment ointment topical to wound daily for right heel ulcer starting 12/11/2020 WOUND #1: - Calcaneus Wound Laterality: Right Cleanser: Soap and Water 1 x Per Day/7 Days Discharge Instructions: May shower and wash wound  with dial antibacterial soap and water prior to dressing change. Cleanser: Wound Cleanser (DME) (Generic) 1 x Per Day/7 Days Discharge Instructions: Cleanse the wound with wound cleanser prior to applying a clean dressing using gauze sponges, not tissue or cotton balls. Prim Dressing: Santyl Ointment 1 x Per Day/7 Days ary Discharge Instructions: Apply nickel thick amount to wound bed as instructed Secondary Dressing: Woven Gauze Sponge, Non-Sterile 4x4 in (DME) (Generic) 1 x Per Day/7 Days Discharge Instructions: Apply over primary dressing as directed. Secondary Dressing: ALLEVYN Heel 4 1/2in x 5 1/2in / 10.5cm x 13.5cm (DME) (Generic) 1 x Per Day/7 Days Discharge Instructions: Apply over primary dressing as directed. Secured With: [employer], 4.5x3.1 (in/yd) (DME) (Generic) 1 x Per Day/7 Days Discharge Instructions: Secure with Kerlix as directed. Secured With: Paper T ape, 2x10 (in/yd) (DME) (Generic) 1 x Per Day/7 Days Discharge Instructions: Secure dressing with tape as directed. 1. This wound will require Santyl for further ongoing debridement. 2. She was not offloading this area properly either in bed or in a lift chair. We gave her instructions for a bunny boot in bed and a lift chair. We gave him an open toed heel offloading boot for the times he is up walking with a walker. 3. The patient was very quiet. He did follow commands however his wife answered most of the questions. I wondered about some degree of cognitive impairment 4. After we can clean the surface of this up a  bit I am wondering about either Apligraf or Dermagraft in this area. 5. He did not require any particular culture. I did not think he required an x-ray at this point. His peripheral pulses were palpable his ABI was 0.93 at this point also I did not think his arterial evaluation needed to be updated on the right beyond what we are able to do in the clinic. All of this may change depending on how this wound  surface responds I spent 45 minutes in review of this patient's past medical history, face-to-face evaluation and preparation of this record Electronic Signature(s) Signed: 12/11/2020 4:50:22 PM By: Linton Ham MD Entered By: Linton Ham on 12/11/2020 12:04:04 -------------------------------------------------------------------------------- HxROS Details Patient Name: Date of Service: [name] [name] 12/11/2020 10:30 A M Medical Record Number: [id number] Patient Account Number: [id number] Date of Birth/Sex: Treating RN: 1928-08-17 (87 y.o. Male) [name] Small Primary Care Provider: Leonides Cave Other Clinician: Referring Provider: Treating Provider/Extender: Earnest Conroy Weeks in Treatment: 0 Information Obtained From Patient Constitutional Symptoms (General Health) Complaints and Symptoms: Positive for: Fatigue Negative for: Fever; Chills; Marked Weight Change Eyes Complaints and Symptoms: Negative for: Dry Eyes; Vision Changes; Glasses / Contacts Medical History: Negative for: Cataracts; Glaucoma; Optic Neuritis Ear/Nose/Mouth/Throat Complaints and Symptoms: Negative for: Chronic sinus problems or rhinitis Medical History: Negative for: Chronic sinus problems/congestion; Middle ear problems Respiratory Complaints and Symptoms: Negative for: Chronic or frequent coughs; Shortness of Breath Medical History: Positive for: Chronic Obstructive Pulmonary Disease (COPD) Negative for: Aspiration; Asthma; Pneumothorax; Sleep Apnea; Tuberculosis Past Medical History Notes: 2L [place] at nighttime Cardiovascular Complaints and Symptoms: Negative for: Chest pain Medical History: Positive for: Congestive Heart Failure; Coronary Artery Disease; Hypertension; Peripheral Arterial Disease Negative for: Angina; Arrhythmia; Deep Vein Thrombosis; Hypotension; Myocardial Infarction; Peripheral Venous Disease; Phlebitis;  Vasculitis Gastrointestinal Complaints and Symptoms: Negative for: Frequent diarrhea; Nausea; Vomiting Medical History: Negative for: Cirrhosis ; Colitis; Crohns; Hepatitis A; Hepatitis B; Hepatitis C Endocrine Complaints and Symptoms: Negative for: Heat/cold intolerance Medical History: Positive for: Type II Diabetes Negative for: Type I Diabetes Time with diabetes: 25-30 yrs Blood sugar tested every day: Yes Tested : Genitourinary Complaints and Symptoms: Negative for: Frequent urination Medical History: Negative for: End Stage Renal Disease Past Medical History Notes: chronic kidney disease stage 3, BPH, pt. gets catheterized daily 5-6 x d/t inability to urinate Integumentary (Skin) Complaints and Symptoms: Negative for: Wounds Medical History: Negative for: History of Burn Musculoskeletal Complaints and Symptoms: Negative for: Muscle Pain; Muscle Weakness Medical History: Negative for: Gout; Rheumatoid Arthritis; Osteoarthritis; Osteomyelitis Neurologic Complaints and Symptoms: Negative for: Numbness/parasthesias Medical History: Positive for: Neuropathy Negative for: Dementia; Quadriplegia; Paraplegia; Seizure Disorder Psychiatric Complaints and Symptoms: Negative for: Claustrophobia; Suicidal Hematologic/Lymphatic Medical History: Negative for: Anemia; Hemophilia; Human Immunodeficiency Virus; Lymphedema; Sickle Cell Disease Immunological Medical History: Negative for: Lupus Erythematosus; Raynauds; Scleroderma Oncologic Medical History: Negative for: Received Chemotherapy; Received Radiation Past Medical History Notes: hx of bladder cancer Immunizations Pneumococcal Vaccine: Received Pneumococcal Vaccination: Yes Immunization Notes: pt. doesn't remember date on tetanus shot Implantable Devices None Hospitalization / Surgery History Type of Hospitalization/Surgery transmet amp left foot appendectomy biopsy partial first amputation ray;  left cardiac catheterization femur IM nail lumbar laminectomy/ decompression ORIF femur fracture penile prosthesis implant peripheral vascular catheterization total hip arthroplasty transurethral resection of bladder tumor Family and Social History Cancer: No; Diabetes: Yes - Mother; Heart Disease: Yes - Siblings; Hereditary Spherocytosis: No; Hypertension: No; Kidney Disease: No; Lung Disease: No; Seizures: No; Stroke: No;  Thyroid Problems: No; Tuberculosis: No; Former smoker; Marital Status - Married; Alcohol Use: Rarely; Drug Use: No History; Caffeine Use: Rarely; Financial Concerns: No; Food, Clothing or Shelter Needs: No; Support System Lacking: No; Transportation Concerns: No Engineer, maintenance) Signed: 12/11/2020 4:50:22 PM By: Linton Ham MD Signed: 12/19/2020 1:48:37 PM By: [name] Hammock RN Entered By: [name] Small on 12/11/2020 10:57:20 -------------------------------------------------------------------------------- SuperBill Details Patient Name: Date of Service: [name] [name] 12/11/2020 Medical Record Number: [id number] Patient Account Number: [id number] Date of Birth/Sex: Treating RN: 02/01/1928 (87 y.o. 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 E11.51 Type 2 diabetes mellitus with diabetic peripheral angiopathy without gangrene E11.42 Type 2 diabetes mellitus with diabetic polyneuropathy Facility Procedures CPT4 Code: [id number] Description: [place] VISIT-LEV 3 EST PT Modifier: 25 Quantity: 1 CPT4 Code: [id number] Description: 35701 - DEB SUBQ TISSUE 20 SQ CM/< ICD-10 Diagnosis Description L97.418  Non-pressure chronic ulcer of right heel and midfoot with other specified sever Modifier: ity Quantity: 1 Physician Procedures : CPT4 Code Description Modifier [id number] 00923 - WC PHYS LEVEL 4 - NEW PT ICD-10 Diagnosis Description E11.621 Type 2 diabetes mellitus with foot ulcer L97.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

## 2020-12-19 NOTE — Progress Notes (Signed)
[name] Small, [name] Small ([id number]) Visit Report for 12/11/2020 Abuse/Suicide Risk Screen Details Patient Name: Date of Service: [name] Small, [name] Small [name] RIO N. 12/11/2020 10:30 A M Medical Record Number: [id number] Patient Account Number: [id number] Date of Birth/Sex: Treating RN: [birth year] (87 y.o. Male) Rhae Hammock Primary Care Keshun Berrett: Leonides Cave Other Clinician: Referring Collis Thede: Treating Teresea Donley/Extender: Earnest Conroy Weeks in Treatment: 0 Abuse/Suicide Risk Screen Items Answer ABUSE RISK SCREEN: Has anyone close to you tried to hurt or harm you recentlyo No Do you feel uncomfortable with anyone in your familyo No Has anyone forced you do things that you didnt want to doo No Electronic Signature(s) Signed: 12/19/2020 1:48:37 PM By: Rhae Hammock RN Entered By: Rhae Hammock on 12/11/2020 10:32:40 -------------------------------------------------------------------------------- Activities of Daily Living Details Patient Name: Date of Service: [name] Small, [name] Small [name] RIO N. 12/11/2020 10:30 A M Medical Record Number: [id number] Patient Account Number: [id number] Date of Birth/Sex: Treating RN: August 29, 1928 (87 y.o. Male) Rhae Hammock Primary Care Vasili Fok: Leonides Cave Other Clinician: Referring Darrel Baroni: Treating Moishe Schellenberg/Extender: Earnest Conroy Weeks in Treatment: 0 Activities of Daily Living Items Answer Activities of Daily Living (Please select one for each item) Drive Automobile Not Able T Medications ake Need Assistance Use T elephone Need Assistance Care for Appearance Need Assistance Use T oilet Need Assistance Bath / Shower Need Assistance Dress Self Need Assistance Feed Self Need Assistance Walk Need Assistance Get In / Out Bed Need Assistance Housework Need Assistance Prepare Meals Need Assistance Handle Money Need Assistance Shop for Self Need Assistance Electronic Signature(s) Signed: 12/19/2020 1:48:37  PM By: Rhae Hammock RN Entered By: Rhae Hammock on 12/11/2020 10:33:19 -------------------------------------------------------------------------------- Education Screening Details Patient Name: Date of Service: [name] Small RIO N. 12/11/2020 10:30 A M Medical Record Number: [id number] Patient Account Number: [id number] Date of Birth/Sex: Treating RN: 15-Feb-1928 (87 y.o. Male) Rhae Hammock Primary Care Devin Foskey: Leonides Cave Other Clinician: Referring Aarohi Redditt: Treating Rasul Decola/Extender: Veneta Penton in Treatment: 0 Primary Learner Assessed: Patient Learning Preferences/Education Level/Primary Language Learning Preference: Explanation, Communication Board, Printed Material Highest Education Level: High School Preferred Language: English Cognitive Barrier Language Barrier: No Translator Needed: No Memory Deficit: No Emotional Barrier: No Cultural/Religious Beliefs Affecting Medical Care: No Physical Barrier Impaired Vision: No Impaired Hearing: Yes Hearing Aid, both ears Decreased Hand dexterity: No Knowledge/Comprehension Knowledge Level: High Comprehension Level: High Ability to understand written instructions: High Ability to understand verbal instructions: High Motivation Anxiety Level: Calm Cooperation: Cooperative Education Importance: Denies Need Interest in Health Problems: Asks Questions Perception: Coherent Willingness to Engage in Self-Management High Activities: Readiness to Engage in Self-Management High Activities: Electronic Signature(s) Signed: 12/19/2020 1:48:37 PM By: Rhae Hammock RN Entered By: Rhae Hammock on 12/11/2020 10:34:01 -------------------------------------------------------------------------------- Fall Risk Assessment Details Patient Name: Date of Service: [name] Small RIO N. 12/11/2020 10:30 A M Medical Record Number: [id number] Patient Account Number: [id number] Date of  Birth/Sex: Treating RN: 1928/08/26 (87 y.o. Male) Rhae Hammock Primary Care Tiron Suski: Leonides Cave Other Clinician: Referring Daiwik Buffalo: Treating Margy Sumler/Extender: Earnest Conroy Weeks in Treatment: 0 Fall Risk Assessment Items Have you had 2 or more falls in the last 12 monthso 0 Yes Have you had any fall that resulted in injury in the last 12 monthso 0 Yes FALLS RISK SCREEN History of falling - immediate or within 3 months 0 No Secondary diagnosis (Do you have 2 or more medical diagnoseso) 0 No Ambulatory aid None/bed rest/wheelchair/nurse 0 Yes Crutches/cane/walker 0 No Furniture 0  No Intravenous therapy Access/Saline/Heparin Lock 0 No Gait/Transferring Normal/ bed rest/ wheelchair 0 Yes Weak (short steps with or without shuffle, stooped but able to lift head while walking, may seek 10 Yes support from furniture) Impaired (short steps with shuffle, may have difficulty arising from chair, head down, impaired 0 No balance) Mental Status Oriented to own ability 0 No Electronic Signature(s) Signed: 12/19/2020 1:48:37 PM By: Rhae Hammock RN Entered By: Rhae Hammock on 12/11/2020 10:35:27 -------------------------------------------------------------------------------- Foot Assessment Details Patient Name: Date of Service: [name] Small RIO N. 12/11/2020 10:30 A M Medical Record Number: [id number] Patient Account Number: [id number] Date of Birth/Sex: Treating RN: [birth year] (87 y.o. Male) Rhae Hammock Primary Care Ania Levay: Leonides Cave Other Clinician: Referring Judene Logue: Treating Demira Gwynne/Extender: Earnest Conroy Weeks in Treatment: 0 Foot Assessment Items Site Locations + = Sensation present, - = Sensation absent, C = Callus, U = Ulcer R = Redness, W = Warmth, M = Maceration, PU = Pre-ulcerative lesion F = Fissure, S = Swelling, D = Dryness Assessment Right: Left: Other Deformity: No No Prior  Foot Ulcer: No No Prior Amputation: No No Charcot Joint: No No Ambulatory Status: Ambulatory With Help Assistance Device: Wheelchair Gait: Administrator, arts) Signed: 12/19/2020 1:48:37 PM By: Rhae Hammock RN Entered By: Rhae Hammock on 12/11/2020 10:59:52 -------------------------------------------------------------------------------- Nutrition Risk Screening Details Patient Name: Date of Service: [name] Small RIO N. 12/11/2020 10:30 A M Medical Record Number: [id number] Patient Account Number: [id number] Date of Birth/Sex: Treating RN: 11-Aug-1928 (87 y.o. Male) Rhae Hammock Primary Care Teresha Hanks: Leonides Cave Other Clinician: Referring Deziyah Arvin: Treating Dwight Adamczak/Extender: Earnest Conroy Weeks in Treatment: 0 Height (in): 74 Weight (lbs): 205 Body Mass Index (BMI): 26.3 Nutrition Risk Screening Items Score Screening NUTRITION RISK SCREEN: I have an illness or condition that made me change the kind and/or amount of food I eat 0 No I eat fewer than two meals per day 0 No I eat few fruits and vegetables, or milk products 0 No I have three or more drinks of beer, liquor or wine almost every day 0 No I have tooth or mouth problems that make it hard for me to eat 0 No I don't always have enough money to buy the food I need 0 No I eat alone most of the time 0 No I take three or more different prescribed or over-the-counter drugs a day 0 No Without wanting to, I have lost or gained 10 pounds in the last six months 0 No I am not always physically able to shop, cook and/or feed myself 0 No Nutrition Protocols Good Risk Protocol 0 No interventions needed Moderate Risk Protocol High Risk Proctocol Risk Level: Good Risk Score: 0 Electronic Signature(s) Signed: 12/19/2020 1:48:37 PM By: Rhae Hammock RN Entered By: Rhae Hammock on 12/11/2020 10:35:38

## 2020-12-23 DIAGNOSIS — I509 Heart failure, unspecified: Secondary | ICD-10-CM | POA: Diagnosis not present

## 2020-12-23 DIAGNOSIS — R062 Wheezing: Secondary | ICD-10-CM | POA: Diagnosis not present

## 2020-12-23 DIAGNOSIS — J9600 Acute respiratory failure, unspecified whether with hypoxia or hypercapnia: Secondary | ICD-10-CM | POA: Diagnosis not present

## 2020-12-23 DIAGNOSIS — J449 Chronic obstructive pulmonary disease, unspecified: Secondary | ICD-10-CM | POA: Diagnosis not present

## 2020-12-25 ENCOUNTER — Other Ambulatory Visit: Payer: Self-pay

## 2020-12-25 ENCOUNTER — Encounter (HOSPITAL_BASED_OUTPATIENT_CLINIC_OR_DEPARTMENT_OTHER): Payer: Medicare Other | Attending: Internal Medicine | Admitting: Internal Medicine

## 2020-12-25 DIAGNOSIS — I509 Heart failure, unspecified: Secondary | ICD-10-CM | POA: Diagnosis not present

## 2020-12-25 DIAGNOSIS — E1142 Type 2 diabetes mellitus with diabetic polyneuropathy: Secondary | ICD-10-CM | POA: Insufficient documentation

## 2020-12-25 DIAGNOSIS — E11621 Type 2 diabetes mellitus with foot ulcer: Secondary | ICD-10-CM | POA: Insufficient documentation

## 2020-12-25 DIAGNOSIS — I110 Hypertensive heart disease with heart failure: Secondary | ICD-10-CM | POA: Diagnosis not present

## 2020-12-25 DIAGNOSIS — E1151 Type 2 diabetes mellitus with diabetic peripheral angiopathy without gangrene: Secondary | ICD-10-CM | POA: Insufficient documentation

## 2020-12-25 DIAGNOSIS — L97418 Non-pressure chronic ulcer of right heel and midfoot with other specified severity: Secondary | ICD-10-CM | POA: Diagnosis not present

## 2020-12-25 DIAGNOSIS — Z794 Long term (current) use of insulin: Secondary | ICD-10-CM | POA: Insufficient documentation

## 2020-12-25 DIAGNOSIS — L97412 Non-pressure chronic ulcer of right heel and midfoot with fat layer exposed: Secondary | ICD-10-CM | POA: Diagnosis not present

## 2020-12-25 NOTE — Progress Notes (Signed)
ANISH, VANA ([id number]) Visit Report for 12/25/2020 Debridement Details Patient Name: Date of Service: [name] Small, [name] Small [place] RIO N. 12/25/2020 1:15 PM Medical Record Number: [id number] Patient Account Number: [id number] Date of Birth/Sex: Treating RN: [birth year] (87 y.o. [name] Small Primary Care Provider: Leonides Cave Other Clinician: Referring Provider: Treating Provider/Extender: Earnest Conroy Weeks in Treatment: 2 Debridement Performed for Assessment: Wound #1 Right Calcaneus Performed By: Physician Ricard Dillon., MD Debridement Type: Debridement Severity of Tissue Pre Debridement: Fat layer exposed Level of Consciousness (Pre-procedure): Awake and Alert Pre-procedure Verification/Time Out Yes - 13:46 Taken: Start Time: 13:46 T Area Debrided (L x W): otal 2 (cm) x 3.8 (cm) = 7.6 (cm) Tissue and other material debrided: Viable, Non-Viable, Slough, Subcutaneous, Slough Level: Skin/Subcutaneous Tissue Debridement Description: Excisional Instrument: Curette Bleeding: Minimum Hemostasis Achieved: Pressure End Time: 13:47 Procedural Pain: 0 Post Procedural Pain: 0 Response to Treatment: Procedure was tolerated well Level of Consciousness (Post- Awake and Alert procedure): Post Debridement Measurements of Total Wound Length: (cm) 2 Width: (cm) 3.8 Depth: (cm) 0.5 Volume: (cm) 2.985 Character of Wound/Ulcer Post Debridement: Requires Further Debridement Severity of Tissue Post Debridement: Fat layer exposed Post Procedure Diagnosis Same as Pre-procedure Electronic Signature(s) Signed: 12/25/2020 5:50:50 PM By: Levan Hurst RN, BSN Signed: 12/25/2020 5:51:45 PM By: Linton Ham MD Entered By: Linton Ham on 12/25/2020 14:01:26 -------------------------------------------------------------------------------- HPI Details Patient Name: Date of Service: [name] [name] 12/25/2020 1:15 PM Medical Record Number: [id number] Patient  Account Number: [id number] Date of Birth/Sex: Treating RN: Nov 09, 1928 (87 y.o. [name] Small Primary Care Provider: Leonides Cave Other Clinician: Referring Provider: Treating Provider/Extender: Lina Sayre, Purcell Mouton Weeks in Treatment: 2 History of Present Illness HPI Description: ADMISSION 12/11/2020 This is a 87 year old man who apparently fractured his left hip in June and then had a subsequent fall and femur fracture requiring a separate surgery. He spent a long time at rehab at [facility] skilled facility. Sometime in October his wife noted a blister on the left heel that is gradually morphed into a pressure ulcer. He saw his primary physician on 11/07/2020 and he was referred here. The patient had prior problems with PAD on the left. His last arterial studies in September 2020 showed an ABI on the right of 1.03 with a TBI of 0.73 and biphasic waveforms on the left a ABI of 0.84, TBI's were not done because the toe was surgically absent. He had monophasic and biphasic waveforms. In 2017 he had an angiogram showing a mom diffusely diseased SFA and occluded left popliteal he underwent a left popliteal and left peroneal artery angioplasty. Noteworthy on the LEFT the peroneal was the only patent vessel below the knee. I do not think his right side was actually studied Past medical history; type 2 diabetes on insulin with peripheral neuropathy, hypertension, hyperlipidemia, hip fracture and subsequent femur fracture is noted, and emphysema, history of bladder cancer requiring in and out catheterizations, coronary artery disease, BPH. Updated ABI in our clinic was 0.93 on the right 2/14 Santyl to continue. Mechanical debridement Electronic Signature(s) Signed: 12/25/2020 5:51:45 PM By: Linton Ham MD Entered By: Linton Ham on 12/25/2020 14:01:49 -------------------------------------------------------------------------------- Physical Exam Details Patient Name: Date  of Service: [name] [name] 12/25/2020 1:15 PM Medical Record Number: [id number] Patient Account Number: [id number] Date of Birth/Sex: Treating RN: December 18, 1927 (87 y.o. [name] Small Primary Care Provider: Leonides Cave Other Clinician: Referring Provider: Treating Provider/Extender: Earnest Conroy Weeks in  Treatment: 2 Constitutional Patient is hypertensive.. Pulse regular and within target range for patient.Marland Kitchen Respirations regular, non-labored and within target range.. Temperature is normal and within the target range for the patient.Marland Kitchen Appears in no distress. Cardiovascular Fetal pulses were palpable on the right. Notes Wound exam; the areas on the right heel at the Achilles tip. Still a nonviable surface aggressive debridement once again although I am getting down to healthy bleeding tissue removing large amounts of necrotic subcutaneous tissue but not as much as last week there is no exposed bone no purulence no cultures were done. Electronic Signature(s) Signed: 12/25/2020 5:51:45 PM By: Linton Ham MD Entered By: Linton Ham on 12/25/2020 14:02:50 -------------------------------------------------------------------------------- Physician Orders Details Patient Name: Date of Service: [name] [name] 12/25/2020 1:15 PM Medical Record Number: [id number] Patient Account Number: [id number] Date of Birth/Sex: Treating RN: 03/26/1928 (87 y.o. [name] Small, [name] Small Primary Care Provider: Leonides Cave Other Clinician: Referring Provider: Treating Provider/Extender: Earnest Conroy Weeks in Treatment: 2 Verbal / Phone Orders: No Diagnosis Coding ICD-10 Coding Code Description E11.621 Type 2 diabetes mellitus with foot ulcer L97.418 Non-pressure chronic ulcer of right heel and midfoot with other specified severity E11.51 Type 2 diabetes mellitus with diabetic peripheral angiopathy without gangrene E11.42 Type 2 diabetes  mellitus with diabetic polyneuropathy Follow-up Appointments Return Appointment in 1 week. Bathing/ Shower/ Hygiene May shower and wash wound with soap and water. - prior to dressing change Off-Loading Heel suspension boot to: - Gloped heel offloading sandal to right foot Turn and reposition every 2 hours Other: - float heels off of bed and chair by placing pillow under calves [place] to [place] for wound care. May utilize formulary equivalent dressing for wound treatment orders unless otherwise specified. - A skilled nursing 2-3 times a week for wound care, has cg in home to change dressing all other days during the week Wound Treatment Wound #1 - Calcaneus Wound Laterality: Right Cleanser: Soap and Water 1 x Per Day/15 Days Discharge Instructions: May shower and wash wound with dial antibacterial soap and water prior to dressing change. Cleanser: Byram Ancillary Kit - 15 Day Supply (DME) (Generic) 1 x Per Day/15 Days Discharge Instructions: Use supplies as instructed; Kit contains: (15) Saline Bullets; (15) 3x3 Gauze; 15 pr Gloves Prim Dressing: Santyl Ointment 1 x Per Day/15 Days ary Discharge Instructions: Apply nickel thick amount to wound bed as instructed Prim Dressing: Saline moistened gauze over Santyl ary 1 x Per Day/15 Days Secondary Dressing: Woven Gauze Sponge, Non-Sterile 4x4 in (DME) (Generic) 1 x Per Day/15 Days Discharge Instructions: Apply over primary dressing as directed. Secondary Dressing: ALLEVYN Heel 4 1/2in x 5 1/2in / 10.5cm x 13.5cm (DME) (Generic) 1 x Per Day/15 Days Discharge Instructions: Apply over primary dressing as directed. Secured With: [employer], 4.5x3.1 (in/yd) (Generic) 1 x Per Day/15 Days Discharge Instructions: Secure with Kerlix as directed. Secured With: Paper Tape, 2x10 (in/yd) (DME) (Generic) 1 x Per Day/15 Days Discharge Instructions: Secure dressing with tape as directed. Electronic Signature(s) Signed: 12/25/2020  5:50:50 PM By: Levan Hurst RN, BSN Signed: 12/25/2020 5:51:45 PM By: Linton Ham MD Entered By: Levan Hurst on 12/25/2020 15:03:47 -------------------------------------------------------------------------------- Problem List Details Patient Name: Date of Service: [name] [name] 12/25/2020 1:15 PM Medical Record Number: [id number] Patient Account Number: [id number] Date of Birth/Sex: Treating RN: Aug 12, 1928 (87 y.o. [name] Small Primary Care Provider: Leonides Cave Other Clinician: Referring Provider: Treating Provider/Extender: Earnest Conroy  Weeks in Treatment: 2 Active Problems ICD-10 Encounter Code Description Active Date MDM Diagnosis E11.621 Type 2 diabetes mellitus with foot ulcer 12/11/2020 No Yes L97.418 Non-pressure chronic ulcer of right heel and midfoot with other specified 12/11/2020 No Yes severity E11.51 Type 2 diabetes mellitus with diabetic peripheral angiopathy without gangrene 12/11/2020 No Yes E11.42 Type 2 diabetes mellitus with diabetic polyneuropathy 12/11/2020 No Yes Inactive Problems Resolved Problems Electronic Signature(s) Signed: 12/25/2020 5:51:45 PM By: Linton Ham MD Entered By: Linton Ham on 12/25/2020 14:01:02 -------------------------------------------------------------------------------- Progress Note Details Patient Name: Date of Service: [name] [name] 12/25/2020 1:15 PM Medical Record Number: [id number] Patient Account Number: [id number] Date of Birth/Sex: Treating RN: [birth year] (87 y.o. [name] Small Primary Care Provider: Leonides Cave Other Clinician: Referring Provider: Treating Provider/Extender: Lina Sayre, Purcell Mouton Weeks in Treatment: 2 Subjective History of Present Illness (HPI) ADMISSION 12/11/2020 This is a 87 year old man who apparently fractured his left hip in June and then had a subsequent fall and femur fracture requiring a separate surgery.  He spent a long time at rehab at [facility] skilled facility. Sometime in October his wife noted a blister on the left heel that is gradually morphed into a pressure ulcer. He saw his primary physician on 11/07/2020 and he was referred here. The patient had prior problems with PAD on the left. His last arterial studies in September 2020 showed an ABI on the right of 1.03 with a TBI of 0.73 and biphasic waveforms on the left a ABI of 0.84, TBI's were not done because the toe was surgically absent. He had monophasic and biphasic waveforms. In 2017 he had an angiogram showing a mom diffusely diseased SFA and occluded left popliteal he underwent a left popliteal and left peroneal artery angioplasty. Noteworthy on the LEFT the peroneal was the only patent vessel below the knee. I do not think his right side was actually studied Past medical history; type 2 diabetes on insulin with peripheral neuropathy, hypertension, hyperlipidemia, hip fracture and subsequent femur fracture is noted, and emphysema, history of bladder cancer requiring in and out catheterizations, coronary artery disease, BPH. Updated ABI in our clinic was 0.93 on the right 2/14 Santyl to continue. Mechanical debridement Objective Constitutional Patient is hypertensive.. Pulse regular and within target range for patient.Marland Kitchen Respirations regular, non-labored and within target range.. Temperature is normal and within the target range for the patient.Marland Kitchen Appears in no distress. Vitals Time Taken: 1:11 PM, Height: 74 in, Weight: 205 lbs, BMI: 26.3, Temperature: 97.6 F, Pulse: 80 bpm, Respiratory Rate: 17 breaths/min, Blood Pressure: 163/65 mmHg, Capillary Blood Glucose: 200 mg/dl. Cardiovascular Fetal pulses were palpable on the right. General Notes: Wound exam; the areas on the right heel at the Achilles tip. Still a nonviable surface aggressive debridement once again although I am getting down to healthy bleeding tissue removing large amounts  of necrotic subcutaneous tissue but not as much as last week there is no exposed bone no purulence no cultures were done. Integumentary (Hair, Skin) Wound #1 status is Open. Original cause of wound was Pressure Injury. The wound is located on the Right Calcaneus. The wound measures 2cm length x 3.8cm width x 0.5cm depth; 5.969cm^2 area and 2.985cm^3 volume. There is Fat Layer (Subcutaneous Tissue) exposed. There is no tunneling or undermining noted. There is a medium amount of serosanguineous drainage noted. The wound margin is distinct with the outline attached to the wound base. There is small (1-33%) pink, pale granulation within the wound bed. There is  a large (67-100%) amount of necrotic tissue within the wound bed including Eschar and Adherent Slough. Assessment Active Problems ICD-10 Type 2 diabetes mellitus with foot ulcer Non-pressure chronic ulcer of right heel and midfoot with other specified severity Type 2 diabetes mellitus with diabetic peripheral angiopathy without gangrene Type 2 diabetes mellitus with diabetic polyneuropathy Procedures Wound #1 Pre-procedure diagnosis of Wound #1 is a Diabetic Wound/Ulcer of the Lower Extremity located on the Right Calcaneus .Severity of Tissue Pre Debridement is: Fat layer exposed. There was a Excisional Skin/Subcutaneous Tissue Debridement with a total area of 7.6 sq cm performed by Ricard Dillon., MD. With the following instrument(s): Curette to remove Viable and Non-Viable tissue/material. Material removed includes Subcutaneous Tissue and Slough and. No specimens were taken. A time out was conducted at 13:46, prior to the start of the procedure. A Minimum amount of bleeding was controlled with Pressure. The procedure was tolerated well with a pain level of 0 throughout and a pain level of 0 following the procedure. Post Debridement Measurements: 2cm length x 3.8cm width x 0.5cm depth; 2.985cm^3 volume. Character of Wound/Ulcer Post  Debridement requires further debridement. Severity of Tissue Post Debridement is: Fat layer exposed. Post procedure Diagnosis Wound #1: Same as Pre-Procedure Plan Follow-up Appointments: Return Appointment in 1 week. Bathing/ Shower/ Hygiene: May shower and wash wound with soap and water. - prior to dressing change Off-Loading: Heel suspension boot to: - Gloped heel offloading sandal to right foot Turn and reposition every 2 hours Other: - float heels off of bed and chair by placing pillow under calves Home Health: Admit to [facility] for wound care. May utilize formulary equivalent dressing for wound treatment orders unless otherwise specified. - skilled nursing 2-3 times a week for wound care, has cg in home to change dressing all other days during the week WOUND #1: - Calcaneus Wound Laterality: Right Cleanser: Soap and Water 1 x Per Day/7 Days Discharge Instructions: May shower and wash wound with dial antibacterial soap and water prior to dressing change. Cleanser: [employer]ram Ancillary Kit - 15 Day Supply (DME) (Generic) 1 x Per Day/7 Days Discharge Instructions: Use supplies as instructed; Kit contains: (15) Saline Bullets; (15) 3x3 Gauze; 15 pr Gloves Prim Dressing: Santyl Ointment 1 x Per Day/7 Days ary Discharge Instructions: Apply nickel thick amount to wound bed as instructed Prim Dressing: Saline moistened gauze over Santyl 1 x Per Day/7 Days ary Secondary Dressing: Woven Gauze Sponge, Non-Sterile 4x4 in (DME) (Generic) 1 x Per Day/7 Days Discharge Instructions: Apply over primary dressing as directed. Secondary Dressing: ALLEVYN Heel 4 1/2in x 5 1/2in / 10.5cm x 13.5cm (DME) (Generic) 1 x Per Day/7 Days Discharge Instructions: Apply over primary dressing as directed. Secured With: [employer], 4.5x3.1 (in/yd) (Generic) 1 x Per Day/7 Days Discharge Instructions: Secure with Kerlix as directed. Secured With: Paper T ape, 2x10 (in/yd) (DME) (Generic) 1 x Per Day/7  Days Discharge Instructions: Secure dressing with tape as directed. 1. I continued with the Santyl as the primary dressings. 2. There rigorously offloading this. 3. Fortunately I see no evidence of infection or ischemia. 4. I am hopeful that this will be the final aggressive mechanical debridement. Up until now he is not had any sort of surface that would support healing 5. Ultimately I think he is going to require an advanced treatment product. The patient is unsteady on his feet can only take a few steps. I doubt a snapvac would be useful here Electronic Signature(s) Signed: 12/25/2020 5:51:45  PM By: Linton Ham MD Entered By: Linton Ham on 12/25/2020 14:04:09 -------------------------------------------------------------------------------- SuperBill Details Patient Name: Date of Service: [name] [name] 12/25/2020 Medical Record Number: [id number] Patient Account Number: [id number] Date of Birth/Sex: Treating RN: 04-29-1928 (87 y.o. [name] Small, [name] Small Primary Care Provider: Leonides Cave Other Clinician: Referring Provider: Treating Provider/Extender: Lina Sayre, Purcell Mouton Weeks in Treatment: 2 Diagnosis Coding ICD-10 Codes Code Description [phone] Type 2 diabetes mellitus with foot ulcer L97.418 Non-pressure chronic ulcer of right heel and midfoot with other specified severity E11.51 Type 2 diabetes mellitus with diabetic peripheral angiopathy without gangrene E11.42 Type 2 diabetes mellitus with diabetic polyneuropathy Facility Procedures CPT4 Code: [id number] Description: 24469 - DEB SUBQ TISSUE 20 SQ CM/< ICD-10 Diagnosis Description L97.418 Non-pressure chronic ulcer of right heel and midfoot with other specified seve E11.621 Type 2 diabetes mellitus with foot ulcer Modifier: rity Quantity: 1 Physician Procedures : CPT4 Code Description Modifier [id number] - WC PHYS SUBQ TISS 20 SQ CM ICD-10 Diagnosis Description L97.418 Non-pressure chronic  ulcer of right heel and midfoot with other specified severity E11.621 Type 2 diabetes mellitus with foot ulcer Quantity: 1 Electronic Signature(s) Signed: 12/25/2020 5:51:45 PM By: Linton Ham MD Entered By: Linton Ham on 12/25/2020 14:04:28

## 2020-12-25 NOTE — Progress Notes (Signed)
ESPN, ZEMAN ([id number]) Visit Report for 12/25/2020 Arrival Information Details Patient Name: Date of Service: [name] Small, [name] Small [name] RIO N. 12/25/2020 1:15 PM Medical Record Number: [id number] Patient Account Number: [id number] Date of Birth/Sex: Treating RN: 23-Jan-1928 (87 y.o. [name] Primary Care Kensly Bowmer: Leonides Cave Other Clinician: Referring Donzell Coller: Treating Deatra Mcmahen/Extender: Earnest Conroy Weeks in Treatment: 2 Visit Information History Since Last Visit Added or deleted any medications: No Patient Arrived: Wheel Chair Any new allergies or adverse reactions: No Arrival Time: 13:10 Had a fall or experienced change in No Accompanied By: wife activities of daily living that may affect Transfer Assistance: Manual risk of falls: Patient Identification Verified: Yes Signs or symptoms of abuse/neglect since last visito No Secondary Verification Process Completed: Yes Hospitalized since last visit: No Patient Requires Transmission-Based Precautions: No Implantable device outside of the clinic excluding No Patient Has Alerts: No cellular tissue based products placed in the center since last visit: Has Dressing in Place as Prescribed: Yes Pain Present Now: No Electronic Signature(s) Signed: 12/25/2020 2:29:08 PM By: Sandre Kitty Entered By: Sandre Kitty on 12/25/2020 13:11:12 -------------------------------------------------------------------------------- Encounter Discharge Information Details Patient Name: Date of Service: [name] [name] 12/25/2020 1:15 PM Medical Record Number: [id number] Patient Account Number: [id number] Date of Birth/Sex: Treating RN: Apr 22, 1928 (87 y.o. [name], [name] Primary Care Daphene Chisholm: Leonides Cave Other Clinician: Referring Saphia Vanderford: Treating Noralee Dutko/Extender: Earnest Conroy Weeks in Treatment: 2 Encounter Discharge Information Items Post Procedure Vitals Discharge  Condition: Stable Temperature (F): 97.6 Ambulatory Status: Wheelchair Pulse (bpm): 80 Discharge Destination: Home Respiratory Rate (breaths/min): 17 Transportation: Private Auto Blood Pressure (mmHg): 163/65 Accompanied By: wife Schedule Follow-up Appointment: Yes Clinical Summary of Care: Electronic Signature(s) Signed: 12/25/2020 5:25:06 PM By: Deon Pilling Entered By: Deon Pilling on 12/25/2020 14:24:14 -------------------------------------------------------------------------------- Multi Wound Chart Details Patient Name: Date of Service: [name] [name] 12/25/2020 1:15 PM Medical Record Number: [id number] Patient Account Number: [id number] Date of Birth/Sex: Treating RN: 06/22/1928 (87 y.o. Jonette Eva, Briant Cedar Primary Care Deliyah Muckle: Leonides Cave Other Clinician: Referring Coyle Stordahl: Treating Elvan Ebron/Extender: Earnest Conroy Weeks in Treatment: 2 Vital Signs Height(in): 74 Capillary Blood Glucose(mg/dl): 200 Weight(lbs): 205 Pulse(bpm): 80 Body Mass Index(BMI): 26 Blood Pressure(mmHg): 163/65 Temperature(F): 97.6 Respiratory Rate(breaths/min): 17 Photos: [1:No Photos Right Calcaneus] [N/A:N/A N/A] Wound Location: [1:Pressure Injury] [N/A:N/A] Wounding Event: [1:Diabetic Wound/Ulcer of the Lower] [N/A:N/A] Primary Etiology: [1:Extremity Pressure Ulcer] [N/A:N/A] Secondary Etiology: [1:Chronic Obstructive Pulmonary] [N/A:N/A] Comorbid History: [1:Disease (COPD), Congestive Heart Failure, Coronary Artery Disease, Hypertension, Peripheral Arterial Disease, Type II Diabetes, Neuropathy 08/11/2020] [N/A:N/A] Date Acquired: [1:2] [N/A:N/A] Weeks of Treatment: [1:Open] [N/A:N/A] Wound Status: [1:2x3.8x0.5] [N/A:N/A] Measurements L x W x D (cm) [1:5.969] [N/A:N/A] A (cm) : rea [1:2.985] [N/A:N/A] Volume (cm) : [1:17.70%] [N/A:N/A] % Reduction in A [1:rea: -311.20%] [N/A:N/A] % Reduction in Volume: [1:Grade 2] [N/A:N/A] Classification:  [1:Medium] [N/A:N/A] Exudate A mount: [1:Serosanguineous] [N/A:N/A] Exudate Type: [1:red, brown] [N/A:N/A] Exudate Color: [1:Distinct, outline attached] [N/A:N/A] Wound Margin: [1:Small (1-33%)] [N/A:N/A] Granulation A mount: [1:Pink, Pale] [N/A:N/A] Granulation Quality: [1:Large (67-100%)] [N/A:N/A] Necrotic A mount: [1:Eschar, Adherent Slough] [N/A:N/A] Necrotic Tissue: [1:Fat Layer (Subcutaneous Tissue): Yes N/A] Exposed Structures: [1:Fascia: No Tendon: No Muscle: No Joint: No Bone: No Small (1-33%)] [N/A:N/A] Epithelialization: [1:Debridement - Excisional] [N/A:N/A] Debridement: Pre-procedure Verification/Time Out 13:46 [N/A:N/A] Taken: [1:Subcutaneous, Slough] [N/A:N/A] Tissue Debrided: [1:Skin/Subcutaneous Tissue] [N/A:N/A] Level: [1:7.6] [N/A:N/A] Debridement A (sq cm): [1:rea Curette] [N/A:N/A] Instrument: [1:Minimum] [N/A:N/A] Bleeding: [1:Pressure] [N/A:N/A] Hemostasis A chieved: [1:0] [N/A:N/A] Procedural  Pain: [1:0] [N/A:N/A] Post Procedural Pain: [1:Procedure was tolerated well] [N/A:N/A] Debridement Treatment Response: [1:2x3.8x0.5] [N/A:N/A] Post Debridement Measurements L x W x D (cm) [1:2.985] [N/A:N/A] Post Debridement Volume: (cm) [1:Debridement] [N/A:N/A] Treatment Notes Electronic Signature(s) Signed: 12/25/2020 5:50:50 PM By: Levan Hurst RN, BSN Signed: 12/25/2020 5:51:45 PM By: Linton Ham MD Entered By: Linton Ham on 12/25/2020 14:01:12 -------------------------------------------------------------------------------- Multi-Disciplinary Care Plan Details Patient Name: Date of Service: [name] [name] 12/25/2020 1:15 PM Medical Record Number: [id number] Patient Account Number: [id number] Date of Birth/Sex: Treating RN: 1928-01-21 (87 y.o. Jonette Eva, Briant Cedar Primary Care Jaliya Siegmann: Leonides Cave Other Clinician: Referring Sheketa Ende: Treating Douglas Rooks/Extender: Earnest Conroy Weeks in Treatment: 2 Active  Inactive Abuse / Safety / Falls / Self Care Management Nursing Diagnoses: History of Falls Impaired physical mobility Potential for injury related to falls Goals: Patient will not experience any injury related to falls Date Initiated: 12/11/2020 Target Resolution Date: 01/12/2021 Goal Status: Active Patient/caregiver will verbalize/demonstrate measures taken to prevent injury and/or falls Date Initiated: 12/11/2020 Target Resolution Date: 01/12/2021 Goal Status: Active Interventions: Assess Activities of Daily Living upon admission and as needed Assess fall risk on admission and as needed Assess: immobility, friction, shearing, incontinence upon admission and as needed Assess impairment of mobility on admission and as needed per policy Patient/Caregiver referred to community resources (specify in notes) Provide education on fall prevention Provide education on personal and home safety Notes: Nutrition Nursing Diagnoses: Impaired glucose control: actual or potential Potential for alteratiion in Nutrition/Potential for imbalanced nutrition Goals: Patient/caregiver agrees to and verbalizes understanding of need to use nutritional supplements and/or vitamins as prescribed Date Initiated: 12/11/2020 Target Resolution Date: 01/12/2021 Goal Status: Active Patient/caregiver will maintain therapeutic glucose control Date Initiated: 12/11/2020 Target Resolution Date: 01/12/2021 Goal Status: Active Interventions: Assess HgA1c results as ordered upon admission and as needed Assess patient nutrition upon admission and as needed per policy Provide education on elevated blood sugars and impact on wound healing Provide education on nutrition Treatment Activities: Education provided on Nutrition : 12/11/2020 Notes: Pressure Nursing Diagnoses: Knowledge deficit related to causes and risk factors for pressure ulcer development Knowledge deficit related to management of pressures ulcers Potential for  impaired tissue integrity related to pressure, friction, moisture, and shear Goals: Patient/caregiver will verbalize risk factors for pressure ulcer development Date Initiated: 12/11/2020 Target Resolution Date: 01/12/2021 Goal Status: Active Patient/caregiver will verbalize understanding of pressure ulcer management Date Initiated: 12/11/2020 Target Resolution Date: 01/12/2021 Goal Status: Active Interventions: Assess: immobility, friction, shearing, incontinence upon admission and as needed Assess offloading mechanisms upon admission and as needed Assess potential for pressure ulcer upon admission and as needed Provide education on pressure ulcers Notes: Wound/Skin Impairment Nursing Diagnoses: Impaired tissue integrity Knowledge deficit related to ulceration/compromised skin integrity Goals: Patient/caregiver will verbalize understanding of skin care regimen Date Initiated: 12/11/2020 Target Resolution Date: 01/12/2021 Goal Status: Active Ulcer/skin breakdown will have a volume reduction of 30% by week 4 Date Initiated: 12/11/2020 Target Resolution Date: 01/12/2021 Goal Status: Active Interventions: Assess patient/caregiver ability to obtain necessary supplies Assess patient/caregiver ability to perform ulcer/skin care regimen upon admission and as needed Assess ulceration(s) every visit Provide education on ulcer and skin care Notes: Electronic Signature(s) Signed: 12/25/2020 5:50:50 PM By: Levan Hurst RN, BSN Entered By: Levan Hurst on 12/25/2020 13:39:40 -------------------------------------------------------------------------------- Pain Assessment Details Patient Name: Date of Service: [name] [name] 12/25/2020 1:15 PM Medical Record Number: [id number] Patient Account Number: [id number] Date of Birth/Sex: Treating RN: 1928/03/06 (87 y.o. M)  Levan Hurst Primary Care Thuan Tippett: Leonides Cave Other Clinician: Referring Tymothy Cass: Treating Octavia Mottola/Extender:  Earnest Conroy Weeks in Treatment: 2 Active Problems Location of Pain Severity and Description of Pain Patient Has Paino No Site Locations Pain Management and Medication Current Pain Management: Electronic Signature(s) Signed: 12/25/2020 2:29:08 PM By: Sandre Kitty Signed: 12/25/2020 5:50:50 PM By: Levan Hurst RN, BSN Entered By: Sandre Kitty on 12/25/2020 13:11:46 -------------------------------------------------------------------------------- Patient/Caregiver Education Details Patient Name: Date of Service: [name] Small 2/14/2022andnbsp1:15 PM Medical Record Number: [id number] Patient Account Number: [id number] Date of Birth/Gender: Treating RN: 12/07/1927 (87 y.o. [name] Primary Care Physician: Leonides Cave Other Clinician: Referring Physician: Treating Physician/Extender: Veneta Penton in Treatment: 2 Education Assessment Education Provided To: Patient Education Topics Provided Pressure: Methods: Explain/Verbal Responses: State content correctly Safety: Methods: Explain/Verbal Responses: State content correctly Wound/Skin Impairment: Methods: Explain/Verbal Responses: State content correctly Electronic Signature(s) Signed: 12/25/2020 5:50:50 PM By: Levan Hurst RN, BSN Entered By: Levan Hurst on 12/25/2020 13:40:09 -------------------------------------------------------------------------------- Wound Assessment Details Patient Name: Date of Service: [name] [name] 12/25/2020 1:15 PM Medical Record Number: [id number] Patient Account Number: [id number] Date of Birth/Sex: Treating RN: 1928-06-22 (87 y.o. Jonette Eva, Briant Cedar Primary Care Charan Prieto: Leonides Cave Other Clinician: Referring Judea Riches: Treating Kaaliyah Kita/Extender: Earnest Conroy Weeks in Treatment: 2 Wound Status Wound Number: 1 Primary Diabetic Wound/Ulcer of the Lower  Extremity Etiology: Wound Location: Right Calcaneus Secondary Pressure Ulcer Wounding Event: Pressure Injury Etiology: Date Acquired: 08/11/2020 Wound Open Weeks Of Treatment: 2 Status: Clustered Wound: No Comorbid Chronic Obstructive Pulmonary Disease (COPD), Congestive History: Heart Failure, Coronary Artery Disease, Hypertension, Peripheral Arterial Disease, Type II Diabetes, Neuropathy Wound Measurements Length: (cm) 2 Width: (cm) 3.8 Depth: (cm) 0.5 Area: (cm) 5.969 Volume: (cm) 2.985 % Reduction in Area: 17.7% % Reduction in Volume: -311.2% Epithelialization: Small (1-33%) Tunneling: No Undermining: No Wound Description Classification: Grade 2 Wound Margin: Distinct, outline attached Exudate Amount: Medium Exudate Type: Serosanguineous Exudate Color: red, brown Foul Odor After Cleansing: No Slough/Fibrino Yes Wound Bed Granulation Amount: Small (1-33%) Exposed Structure Granulation Quality: Pink, Pale Fascia Exposed: No Necrotic Amount: Large (67-100%) Fat Layer (Subcutaneous Tissue) Exposed: Yes Necrotic Quality: Eschar, Adherent Slough Tendon Exposed: No Muscle Exposed: No Joint Exposed: No Bone Exposed: No Treatment Notes Wound #1 (Calcaneus) Wound Laterality: Right Cleanser Soap and Water Discharge Instruction: May shower and wash wound with dial antibacterial soap and water prior to dressing change. [employer] Ancillary Kit - 15 Day Supply Discharge Instruction: Use supplies as instructed; Kit contains: (15) Saline Bullets; (15) 3x3 Gauze; 15 pr Gloves Peri-Wound Care Topical Primary Dressing Santyl Ointment Discharge Instruction: Apply nickel thick amount to wound bed as instructed Saline moistened gauze over Santyl Secondary Dressing Woven Gauze Sponge, Non-Sterile 4x4 in Discharge Instruction: Apply over primary dressing as directed. ALLEVYN Heel 4 1/2in x 5 1/2in / 10.5cm x 13.5cm Discharge Instruction: Apply over primary dressing as  directed. Secured With [employer], 4.5x3.1 (in/yd) Discharge Instruction: Secure with Kerlix as directed. Paper Tape, 2x10 (in/yd) Discharge Instruction: Secure dressing with tape as directed. Compression Wrap Compression Stockings Add-Ons Electronic Signature(s) Signed: 12/25/2020 5:25:06 PM By: Deon Pilling Signed: 12/25/2020 5:50:50 PM By: Levan Hurst RN, BSN Entered By: Deon Pilling on 12/25/2020 13:24:43 -------------------------------------------------------------------------------- Vitals Details Patient Name: Date of Service: [name] [name] 12/25/2020 1:15 PM Medical Record Number: [id number] Patient Account Number: [id number] Date of Birth/Sex: Treating RN: 1928-09-30 (87 y.o. [name]  Primary Care Taraann Olthoff: Leonides Cave Other Clinician: Referring Layne Dilauro: Treating Noemi Bellissimo/Extender: Earnest Conroy Weeks in Treatment: 2 Vital Signs Time Taken: 13:11 Temperature (F): 97.6 Height (in): 74 Pulse (bpm): 80 Weight (lbs): 205 Respiratory Rate (breaths/min): 17 Body Mass Index (BMI): 26.3 Blood Pressure (mmHg): 163/65 Capillary Blood Glucose (mg/dl): 200 Reference Range: 80 - 120 mg / dl Electronic Signature(s) Signed: 12/25/2020 2:29:08 PM By: Sandre Kitty Entered By: Sandre Kitty on 12/25/2020 13:11:33

## 2020-12-26 DIAGNOSIS — S91301A Unspecified open wound, right foot, initial encounter: Secondary | ICD-10-CM | POA: Diagnosis not present

## 2020-12-27 ENCOUNTER — Other Ambulatory Visit: Payer: Self-pay | Admitting: Family Medicine

## 2020-12-27 DIAGNOSIS — S91301A Unspecified open wound, right foot, initial encounter: Secondary | ICD-10-CM | POA: Diagnosis not present

## 2021-01-01 ENCOUNTER — Encounter (HOSPITAL_BASED_OUTPATIENT_CLINIC_OR_DEPARTMENT_OTHER): Payer: Medicare Other | Admitting: Internal Medicine

## 2021-01-01 ENCOUNTER — Other Ambulatory Visit: Payer: Self-pay

## 2021-01-01 DIAGNOSIS — I110 Hypertensive heart disease with heart failure: Secondary | ICD-10-CM | POA: Diagnosis not present

## 2021-01-01 DIAGNOSIS — I509 Heart failure, unspecified: Secondary | ICD-10-CM | POA: Diagnosis not present

## 2021-01-01 DIAGNOSIS — Z794 Long term (current) use of insulin: Secondary | ICD-10-CM | POA: Diagnosis not present

## 2021-01-01 DIAGNOSIS — L97418 Non-pressure chronic ulcer of right heel and midfoot with other specified severity: Secondary | ICD-10-CM | POA: Diagnosis not present

## 2021-01-01 DIAGNOSIS — E1142 Type 2 diabetes mellitus with diabetic polyneuropathy: Secondary | ICD-10-CM | POA: Diagnosis not present

## 2021-01-01 DIAGNOSIS — L97412 Non-pressure chronic ulcer of right heel and midfoot with fat layer exposed: Secondary | ICD-10-CM | POA: Diagnosis not present

## 2021-01-01 DIAGNOSIS — E11621 Type 2 diabetes mellitus with foot ulcer: Secondary | ICD-10-CM | POA: Diagnosis not present

## 2021-01-01 DIAGNOSIS — E1151 Type 2 diabetes mellitus with diabetic peripheral angiopathy without gangrene: Secondary | ICD-10-CM | POA: Diagnosis not present

## 2021-01-01 NOTE — Progress Notes (Signed)
DONTERRIUS, SANTUCCI ([id number]) Visit Report for 01/01/2021 Debridement Details Patient Name: Date of Service: ZERIC, BARANOWSKI [place] RIO N. 01/01/2021 2:15 PM Medical Record Number: [id number] Patient Account Number: [id number] Date of Birth/Sex: Treating RN: 1928-03-21 (87 y.o. [name] Primary Care Provider: Leonides Cave Other Clinician: Referring Provider: Treating Provider/Extender: Earnest Conroy Weeks in Treatment: 3 Debridement Performed for Assessment: Wound #1 Right Calcaneus Performed By: Physician Ricard Dillon., MD Debridement Type: Debridement Severity of Tissue Pre Debridement: Fat layer exposed Level of Consciousness (Pre-procedure): Awake and Alert Pre-procedure Verification/Time Out Yes - 15:28 Taken: Start Time: 15:28 Pain Control: Other : Benzocaine 20% T Area Debrided (L x W): otal 1.9 (cm) x 3.1 (cm) = 5.89 (cm) Tissue and other material debrided: Viable, Non-Viable, Slough, Subcutaneous, Slough Level: Skin/Subcutaneous Tissue Debridement Description: Excisional Instrument: Curette Bleeding: Moderate Hemostasis Achieved: Silver Nitrate End Time: 15:30 Procedural Pain: 0 Post Procedural Pain: 0 Response to Treatment: Procedure was tolerated well Level of Consciousness (Post- Awake and Alert procedure): Post Debridement Measurements of Total Wound Length: (cm) 1.9 Width: (cm) 3.1 Depth: (cm) 0.5 Volume: (cm) 2.313 Character of Wound/Ulcer Post Debridement: Requires Further Debridement Severity of Tissue Post Debridement: Fat layer exposed Post Procedure Diagnosis Same as Pre-procedure Electronic Signature(s) Signed: 01/01/2021 5:41:15 PM By: Levan Hurst RN, BSN Signed: 01/01/2021 5:55:48 PM By: Linton Ham MD Entered By: Linton Ham on 01/01/2021 15:56:49 -------------------------------------------------------------------------------- HPI Details Patient Name: Date of Service: [name] 01/01/2021  2:15 PM Medical Record Number: [id number] Patient Account Number: [id number] Date of Birth/Sex: Treating RN: February 14, 1928 (87 y.o. [name] Primary Care Provider: Leonides Cave Other Clinician: Referring Provider: Treating Provider/Extender: Lina Sayre, Purcell Mouton Weeks in Treatment: 3 History of Present Illness HPI Description: ADMISSION 12/11/2020 This is a 87 year old man who apparently fractured his left hip in June and then had a subsequent fall and femur fracture requiring a separate surgery. He spent a long time at rehab at [facility] skilled facility. Sometime in October his wife noted a blister on the left heel that is gradually morphed into a pressure ulcer. He saw his primary physician on 11/07/2020 and he was referred here. The patient had prior problems with PAD on the left. His last arterial studies in September 2020 showed an ABI on the right of 1.03 with a TBI of 0.73 and biphasic waveforms on the left a ABI of 0.84, TBI's were not done because the toe was surgically absent. He had monophasic and biphasic waveforms. In 2017 he had an angiogram showing a mom diffusely diseased SFA and occluded left popliteal he underwent a left popliteal and left peroneal artery angioplasty. Noteworthy on the LEFT the peroneal was the only patent vessel below the knee. I do not think his right side was actually studied Past medical history; type 2 diabetes on insulin with peripheral neuropathy, hypertension, hyperlipidemia, hip fracture and subsequent femur fracture is noted, and emphysema, history of bladder cancer requiring in and out catheterizations, coronary artery disease, BPH. Updated ABI in our clinic was 0.93 on the right 2/14 Santyl to continue. Mechanical debridement 2/21; using Santyl. Still requiring mechanical debridement today which was disappointing. Electronic Signature(s) Signed: 01/01/2021 5:55:48 PM By: Linton Ham MD Entered By: Linton Ham on  01/01/2021 15:57:31 -------------------------------------------------------------------------------- Physical Exam Details Patient Name: Date of Service: [name] 01/01/2021 2:15 PM Medical Record Number: [id number] Patient Account Number: [id number] Date of Birth/Sex: Treating RN: 1928-09-14 (87 y.o. [name] Primary  Care Provider: Leonides Cave Other Clinician: Referring Provider: Treating Provider/Extender: Earnest Conroy Weeks in Treatment: 3 Constitutional Sitting or standing Blood Pressure is within target range for patient.. Pulse regular and within target range for patient.Marland Kitchen Respirations regular, non-labored and within target range.. Temperature is normal and within the target range for the patient.Marland Kitchen Appears in no distress. Notes Wound exam; the area on the right heel at the Achilles tip. Still a nonviable surface although better. I used a #5 curette and once again removed necrotic debris. Hemostasis with silver nitrate and a pressure dressing Electronic Signature(s) Signed: 01/01/2021 5:55:48 PM By: Linton Ham MD Entered By: Linton Ham on 01/01/2021 15:58:14 -------------------------------------------------------------------------------- Physician Orders Details Patient Name: Date of Service: [name] 01/01/2021 2:15 PM Medical Record Number: [id number] Patient Account Number: [id number] Date of Birth/Sex: Treating RN: Aug 14, 1928 (87 y.o. Jonette Eva, Briant Cedar Primary Care Provider: Leonides Cave Other Clinician: Referring Provider: Treating Provider/Extender: Earnest Conroy Weeks in Treatment: 3 Verbal / Phone Orders: No Diagnosis Coding ICD-10 Coding Code Description E11.621 Type 2 diabetes mellitus with foot ulcer L97.418 Non-pressure chronic ulcer of right heel and midfoot with other specified severity E11.51 Type 2 diabetes mellitus with diabetic peripheral angiopathy without  gangrene E11.42 Type 2 diabetes mellitus with diabetic polyneuropathy Follow-up Appointments Return appointment in 3 weeks. Bathing/ Shower/ Hygiene May shower and wash wound with soap and water. - prior to dressing change Off-Loading Heel suspension boot to: - Gloped heel offloading sandal to right foot Turn and reposition every 2 hours Other: - float heels off of bed and chair by placing pillow under calves Home Health No change in wound care orders this week; continue [facility] for wound care. May utilize formulary equivalent dressing for wound treatment orders unless otherwise specified. Other Home Health Orders/Instructions: - [facility] for wound care 2-3x a week Wound Treatment Wound #1 - Calcaneus Wound Laterality: Right Cleanser: Wound Cleanser 1 x Per YYT/03 Days Discharge Instructions: Cleanse the wound with wound cleanser or normal saline prior to applying a clean dressing using gauze sponges, not tissue or cotton balls. Prim Dressing: Santyl Ointment 1 x Per Day/15 Days ary Discharge Instructions: Apply nickel thick amount to wound bed as instructed Prim Dressing: Saline moistened gauze over Santyl ary 1 x Per Day/15 Days Secondary Dressing: Woven Gauze Sponge, Non-Sterile 4x4 in (Generic) 1 x Per Day/15 Days Discharge Instructions: Apply over primary dressing as directed. Secondary Dressing: ALLEVYN Heel 4 1/2in x 5 1/2in / 10.5cm x 13.5cm (Generic) 1 x Per Day/15 Days Discharge Instructions: Apply over primary dressing as directed. Secured With: [employer], 4.5x3.1 (in/yd) (Generic) 1 x Per Day/15 Days Discharge Instructions: Secure with Kerlix as directed. Secured With: Paper Tape, 2x10 (in/yd) (Generic) 1 x Per Day/15 Days Discharge Instructions: Secure dressing with tape as directed. Electronic Signature(s) Signed: 01/01/2021 5:41:15 PM By: Levan Hurst RN, BSN Signed: 01/01/2021 5:55:48 PM By: Linton Ham MD Entered By: Levan Hurst on 01/01/2021  15:32:48 -------------------------------------------------------------------------------- Problem List Details Patient Name: Date of Service: [name] 01/01/2021 2:15 PM Medical Record Number: [id number] Patient Account Number: [id number] Date of Birth/Sex: Treating RN: 07-13-1928 (87 y.o. [name] Primary Care Provider: Leonides Cave Other Clinician: Referring Provider: Treating Provider/Extender: Earnest Conroy Weeks in Treatment: 3 Active Problems ICD-10 Encounter Code Description Active Date MDM Diagnosis E11.621 Type 2 diabetes mellitus with foot ulcer 12/11/2020 No Yes L97.418 Non-pressure chronic ulcer of right heel  and midfoot with other specified 12/11/2020 No Yes severity E11.51 Type 2 diabetes mellitus with diabetic peripheral angiopathy without gangrene 12/11/2020 No Yes E11.42 Type 2 diabetes mellitus with diabetic polyneuropathy 12/11/2020 No Yes Inactive Problems Resolved Problems Electronic Signature(s) Signed: 01/01/2021 5:55:48 PM By: Linton Ham MD Entered By: Linton Ham on 01/01/2021 15:56:32 -------------------------------------------------------------------------------- Progress Note Details Patient Name: Date of Service: [name] 01/01/2021 2:15 PM Medical Record Number: [id number] Patient Account Number: [id number] Date of Birth/Sex: Treating RN: 02-28-1928 (87 y.o. [name] Primary Care Provider: Leonides Cave Other Clinician: Referring Provider: Treating Provider/Extender: Earnest Conroy Weeks in Treatment: 3 Subjective History of Present Illness (HPI) ADMISSION 12/11/2020 This is a 87 year old man who apparently fractured his left hip in June and then had a subsequent fall and femur fracture requiring a separate surgery. He spent a long time at rehab at [facility] skilled facility. Sometime in October his wife noted a blister on the left heel that is  gradually morphed into a pressure ulcer. He saw his primary physician on 11/07/2020 and he was referred here. The patient had prior problems with PAD on the left. His last arterial studies in September 2020 showed an ABI on the right of 1.03 with a TBI of 0.73 and biphasic waveforms on the left a ABI of 0.84, TBI's were not done because the toe was surgically absent. He had monophasic and biphasic waveforms. In 2017 he had an angiogram showing a mom diffusely diseased SFA and occluded left popliteal he underwent a left popliteal and left peroneal artery angioplasty. Noteworthy on the LEFT the peroneal was the only patent vessel below the knee. I do not think his right side was actually studied Past medical history; type 2 diabetes on insulin with peripheral neuropathy, hypertension, hyperlipidemia, hip fracture and subsequent femur fracture is noted, and emphysema, history of bladder cancer requiring in and out catheterizations, coronary artery disease, BPH. Updated ABI in our clinic was 0.93 on the right 2/14 Santyl to continue. Mechanical debridement 2/21; using Santyl. Still requiring mechanical debridement today which was disappointing. Objective Constitutional Sitting or standing Blood Pressure is within target range for patient.. Pulse regular and within target range for patient.Marland Kitchen Respirations regular, non-labored and within target range.. Temperature is normal and within the target range for the patient.Marland Kitchen Appears in no distress. Vitals Time Taken: 2:27 PM, Height: 74 in, Weight: 205 lbs, BMI: 26.3, Temperature: 98.4 F, Pulse: 88 bpm, Respiratory Rate: 17 breaths/min, Blood Pressure: 105/61 mmHg, Capillary Blood Glucose: 140 mg/dl. General Notes: Wound exam; the area on the right heel at the Achilles tip. Still a nonviable surface although better. I used a #5 curette and once again removed necrotic debris. Hemostasis with silver nitrate and a pressure dressing Integumentary (Hair,  Skin) Wound #1 status is Open. Original cause of wound was Pressure Injury. The date acquired was: 08/11/2020. The wound has been in treatment 3 weeks. The wound is located on the Right Calcaneus. The wound measures 1.9cm length x 3.1cm width x 0.5cm depth; 4.626cm^2 area and 2.313cm^3 volume. There is Fat Layer (Subcutaneous Tissue) exposed. There is no tunneling or undermining noted. There is a medium amount of serosanguineous drainage noted. The wound margin is distinct with the outline attached to the wound base. There is small (1-33%) pink, pale granulation within the wound bed. There is a large (67- 100%) amount of necrotic tissue within the wound bed including Eschar and Adherent Slough. Assessment Active Problems ICD-10 Type 2 diabetes mellitus  with foot ulcer Non-pressure chronic ulcer of right heel and midfoot with other specified severity Type 2 diabetes mellitus with diabetic peripheral angiopathy without gangrene Type 2 diabetes mellitus with diabetic polyneuropathy Procedures Wound #1 Pre-procedure diagnosis of Wound #1 is a Diabetic Wound/Ulcer of the Lower Extremity located on the Right Calcaneus .Severity of Tissue Pre Debridement is: Fat layer exposed. There was a Excisional Skin/Subcutaneous Tissue Debridement with a total area of 5.89 sq cm performed by Ricard Dillon., MD. With the following instrument(s): Curette to remove Viable and Non-Viable tissue/material. Material removed includes Subcutaneous Tissue and Slough and after achieving pain control using Other (Benzocaine 20%). No specimens were taken. A time out was conducted at 15:28, prior to the start of the procedure. A Moderate amount of bleeding was controlled with Silver Nitrate. The procedure was tolerated well with a pain level of 0 throughout and a pain level of 0 following the procedure. Post Debridement Measurements: 1.9cm length x 3.1cm width x 0.5cm depth; 2.313cm^3 volume. Character of Wound/Ulcer Post  Debridement requires further debridement. Severity of Tissue Post Debridement is: Fat layer exposed. Post procedure Diagnosis Wound #1: Same as Pre-Procedure Plan Follow-up Appointments: Return appointment in 3 weeks. Bathing/ Shower/ Hygiene: May shower and wash wound with soap and water. - prior to dressing change Off-Loading: Heel suspension boot to: - Gloped heel offloading sandal to right foot Turn and reposition every 2 hours Other: - float heels off of bed and chair by placing pillow under calves Home Health: No change in wound care orders this week; continue [facility] for wound care. May utilize formulary equivalent dressing for wound treatment orders unless otherwise specified. Other Home Health Orders/Instructions: - [facility] for wound care 2-3x a week WOUND #1: - Calcaneus Wound Laterality: Right Cleanser: Wound Cleanser 1 x Per ONG/29 Days Discharge Instructions: Cleanse the wound with wound cleanser or normal saline prior to applying a clean dressing using gauze sponges, not tissue or cotton balls. Prim Dressing: Santyl Ointment 1 x Per Day/15 Days ary Discharge Instructions: Apply nickel thick amount to wound bed as instructed Prim Dressing: Saline moistened gauze over Santyl 1 x Per Day/15 Days ary Secondary Dressing: Woven Gauze Sponge, Non-Sterile 4x4 in (Generic) 1 x Per Day/15 Days Discharge Instructions: Apply over primary dressing as directed. Secondary Dressing: ALLEVYN Heel 4 1/2in x 5 1/2in / 10.5cm x 13.5cm (Generic) 1 x Per Day/15 Days Discharge Instructions: Apply over primary dressing as directed. Secured With: [employer], 4.5x3.1 (in/yd) (Generic) 1 x Per Day/15 Days Discharge Instructions: Secure with Kerlix as directed. Secured With: Paper T ape, 2x10 (in/yd) (Generic) 1 x Per Day/15 Days Discharge Instructions: Secure dressing with tape as directed. 1. I am continuing with Santyl. 2. His wife showed me a tube of Medihoney gel and asked me if  this was alternative she ran out of [place] and I told her it was. 3. So far no evidence of infection here. Electronic Signature(s) Signed: 01/01/2021 5:55:48 PM By: Linton Ham MD Signed: 01/01/2021 5:55:48 PM By: Linton Ham MD Entered By: Linton Ham on 01/01/2021 15:58:55 -------------------------------------------------------------------------------- SuperBill Details Patient Name: Date of Service: [name] 01/01/2021 Medical Record Number: [id number] Patient Account Number: [id number] Date of Birth/Sex: Treating RN: 1928-06-18 (87 y.o. [name] Primary Care Provider: Leonides Cave Other Clinician: Referring Provider: Treating Provider/Extender: Earnest Conroy Weeks in Treatment: 3 Diagnosis Coding ICD-10 Codes Code Description [phone] Type 2 diabetes mellitus with foot ulcer L97.418 Non-pressure chronic ulcer  of right heel and midfoot with other specified severity E11.51 Type 2 diabetes mellitus with diabetic peripheral angiopathy without gangrene E11.42 Type 2 diabetes mellitus with diabetic polyneuropathy Facility Procedures CPT4 Code: [id number] Description: 77375 - DEB SUBQ TISSUE 20 SQ CM/< ICD-10 Diagnosis Description L97.418 Non-pressure chronic ulcer of right heel and midfoot with other specified sev Modifier: erity Quantity: 1 Physician Procedures : CPT4 Code Description Modifier [id number] - WC PHYS SUBQ TISS 20 SQ CM ICD-10 Diagnosis Description L97.418 Non-pressure chronic ulcer of right heel and midfoot with other specified severity Quantity: 1 Electronic Signature(s) Signed: 01/01/2021 5:55:48 PM By: Linton Ham MD Entered By: Linton Ham on 01/01/2021 15:59:16

## 2021-01-02 DIAGNOSIS — E11621 Type 2 diabetes mellitus with foot ulcer: Secondary | ICD-10-CM | POA: Diagnosis not present

## 2021-01-02 DIAGNOSIS — E785 Hyperlipidemia, unspecified: Secondary | ICD-10-CM | POA: Diagnosis not present

## 2021-01-02 DIAGNOSIS — J439 Emphysema, unspecified: Secondary | ICD-10-CM | POA: Diagnosis not present

## 2021-01-02 DIAGNOSIS — I509 Heart failure, unspecified: Secondary | ICD-10-CM | POA: Diagnosis not present

## 2021-01-02 DIAGNOSIS — I110 Hypertensive heart disease with heart failure: Secondary | ICD-10-CM | POA: Diagnosis not present

## 2021-01-02 DIAGNOSIS — L97412 Non-pressure chronic ulcer of right heel and midfoot with fat layer exposed: Secondary | ICD-10-CM | POA: Diagnosis not present

## 2021-01-02 DIAGNOSIS — Z8551 Personal history of malignant neoplasm of bladder: Secondary | ICD-10-CM | POA: Diagnosis not present

## 2021-01-02 DIAGNOSIS — E1142 Type 2 diabetes mellitus with diabetic polyneuropathy: Secondary | ICD-10-CM | POA: Diagnosis not present

## 2021-01-02 DIAGNOSIS — Z9181 History of falling: Secondary | ICD-10-CM | POA: Diagnosis not present

## 2021-01-02 DIAGNOSIS — E1151 Type 2 diabetes mellitus with diabetic peripheral angiopathy without gangrene: Secondary | ICD-10-CM | POA: Diagnosis not present

## 2021-01-02 NOTE — Progress Notes (Signed)
[name], Small ([id number]) Visit Report for 01/01/2021 Arrival Information Details Patient Name: Date of Service: [name], [name] Small 01/01/2021 2:15 PM Medical Record Number: [id number] Patient Account Number: [id number] Date of Birth/Sex: Treating RN: 02-15-1928 (87 y.o. [name] Small Primary Care Mousa Prout: Leonides Cave Other Clinician: Referring Carlee Tesfaye: Treating Kia Stavros/Extender: Earnest Conroy Weeks in Treatment: 3 Visit Information History Since Last Visit Added or deleted any medications: No Patient Arrived: Wheel Chair Any new allergies or adverse reactions: No Arrival Time: 14:27 Had a fall or experienced change in No Accompanied By: family activities of daily living that may affect Transfer Assistance: None risk of falls: Patient Identification Verified: Yes Signs or symptoms of abuse/neglect since last visito No Secondary Verification Process Completed: Yes Hospitalized since last visit: No Patient Requires Transmission-Based Precautions: No Implantable device outside of the clinic excluding No Patient Has Alerts: No cellular tissue based products placed in the center since last visit: Has Dressing in Place as Prescribed: Yes Pain Present Now: No Electronic Signature(s) Signed: 01/02/2021 5:43:17 PM By: Rhae Hammock RN Entered By: Rhae Hammock on 01/01/2021 14:27:29 -------------------------------------------------------------------------------- Encounter Discharge Information Details Patient Name: Date of Service: [name] Left SA [name] N. 01/01/2021 2:15 PM Medical Record Number: [id number] Patient Account Number: [id number] Date of Birth/Sex: Treating RN: 01-08-1928 (87 y.o. [name] Small Primary Care Courtnee Myer: Leonides Cave Other Clinician: Referring Neoma Uhrich: Treating Jonty Morrical/Extender: Earnest Conroy Weeks in Treatment: 3 Encounter Discharge Information Items Post Procedure  Vitals Discharge Condition: Stable Temperature (F): 98.4 Ambulatory Status: Wheelchair Pulse (bpm): 88 Discharge Destination: Home Respiratory Rate (breaths/min): 17 Transportation: Private Auto Blood Pressure (mmHg): 105/61 Accompanied By: wife Schedule Follow-up Appointment: Yes Clinical Summary of Care: Electronic Signature(s) Signed: 01/01/2021 6:14:41 PM By: Deon Pilling Entered By: Deon Pilling on 01/01/2021 17:52:34 -------------------------------------------------------------------------------- Lower Extremity Assessment Details Patient Name: Date of Service: [name] Small, [name] [name] N. 01/01/2021 2:15 PM Medical Record Number: [id number] Patient Account Number: [id number] Date of Birth/Sex: Treating RN: 03/13/1928 (87 y.o. [name] Small Primary Care Marcha Licklider: Leonides Cave Other Clinician: Referring Sanjith Siwek: Treating Rayshon Albaugh/Extender: Earnest Conroy Weeks in Treatment: 3 Edema Assessment Assessed: Shirlyn Goltz: No] Patrice Paradise: No] Edema: [Left: N] [Right: o] Calf Left: Right: Point of Measurement: 33 cm From Medial Instep 36 cm Ankle Left: Right: Point of Measurement: 12 cm From Medial Instep 21 cm Vascular Assessment Pulses: Dorsalis Pedis Palpable: [Right:Yes] Posterior Tibial Palpable: [Right:Yes] Electronic Signature(s) Signed: 01/02/2021 5:43:17 PM By: Rhae Hammock RN Entered By: Rhae Hammock on 01/01/2021 14:31:52 -------------------------------------------------------------------------------- Multi Wound Chart Details Patient Name: Date of Service: [name] Left SA [name] N. 01/01/2021 2:15 PM Medical Record Number: [id number] Patient Account Number: [id number] Date of Birth/Sex: Treating RN: Oct 05, 1928 (87 y.o. [name] Small Primary Care Marissa Lowrey: Leonides Cave Other Clinician: Referring Shields Pautz: Treating Takeria Marquina/Extender: Earnest Conroy Weeks in Treatment: 3 Vital Signs Height(in):  74 Capillary Blood Glucose(mg/dl): 140 Weight(lbs): 205 Pulse(bpm): 23 Body Mass Index(BMI): 26 Blood Pressure(mmHg): 105/61 Temperature(F): 98.4 Respiratory Rate(breaths/min): 17 Photos: [1:No Photos Right Calcaneus] [N/A:N/A N/A] Wound Location: [1:Pressure Injury] [N/A:N/A] Wounding Event: [1:Diabetic Wound/Ulcer of the Lower] [N/A:N/A] Primary Etiology: [1:Extremity Pressure Ulcer] [N/A:N/A] Secondary Etiology: [1:Chronic Obstructive Pulmonary] [N/A:N/A] Comorbid History: [1:Disease (COPD), Congestive Heart Failure, Coronary Artery Disease, Hypertension, Peripheral Arterial Disease, Type II Diabetes, Neuropathy 08/11/2020] [N/A:N/A] Date Acquired: [1:3] [N/A:N/A] Weeks of Treatment: [1:Open] [N/A:N/A] Wound Status: [1:1.9x3.1x0.5] [N/A:N/A] Measurements L x W x D (cm) [1:4.626] [N/A:N/A] A (cm) : rea [1:2.313] [N/A:N/A] Volume (  cm) : [1:36.30%] [N/A:N/A] % Reduction in A [1:rea: -218.60%] [N/A:N/A] % Reduction in Volume: [1:Grade 2] [N/A:N/A] Classification: [1:Medium] [N/A:N/A] Exudate A mount: [1:Serosanguineous] [N/A:N/A] Exudate Type: [1:red, brown] [N/A:N/A] Exudate Color: [1:Distinct, outline attached] [N/A:N/A] Wound Margin: [1:Small (1-33%)] [N/A:N/A] Granulation A mount: [1:Pink, Pale] [N/A:N/A] Granulation Quality: [1:Large (67-100%)] [N/A:N/A] Necrotic A mount: [1:Eschar, Adherent Slough] [N/A:N/A] Necrotic Tissue: [1:Fat Layer (Subcutaneous Tissue): Yes N/A] Exposed Structures: [1:Fascia: No Tendon: No Muscle: No Joint: No Bone: No Small (1-33%)] [N/A:N/A] Epithelialization: [1:Debridement - Excisional] [N/A:N/A] Debridement: Pre-procedure Verification/Time Out 15:28 [N/A:N/A] Taken: [1:Other] [N/A:N/A] Pain Control: [1:Subcutaneous, Slough] [N/A:N/A] Tissue Debrided: [1:Skin/Subcutaneous Tissue] [N/A:N/A] Level: [1:5.89] [N/A:N/A] Debridement A (sq cm): [1:rea Curette] [N/A:N/A] Instrument: [1:Moderate] [N/A:N/A] Bleeding: [1:Silver Nitrate]  [N/A:N/A] Hemostasis A chieved: [1:0] [N/A:N/A] Procedural Pain: [1:0] [N/A:N/A] Post Procedural Pain: [1:Procedure was tolerated well] [N/A:N/A] Debridement Treatment Response: [1:1.9x3.1x0.5] [N/A:N/A] Post Debridement Measurements L x W x D (cm) [1:2.313] [N/A:N/A] Post Debridement Volume: (cm) [1:Debridement] [N/A:N/A] Treatment Notes Electronic Signature(s) Signed: 01/01/2021 5:41:15 PM By: Levan Hurst RN, BSN Signed: 01/01/2021 5:55:48 PM By: Linton Ham MD Entered By: Linton Ham on 01/01/2021 15:56:42 -------------------------------------------------------------------------------- Multi-Disciplinary Care Plan Details Patient Name: Date of Service: [name] Left SA [name] N. 01/01/2021 2:15 PM Medical Record Number: [id number] Patient Account Number: [id number] Date of Birth/Sex: Treating RN: 20-Sep-1928 (87 y.o. Jonette Eva, Briant Cedar Primary Care Clarrissa Shimkus: Leonides Cave Other Clinician: Referring Jackelyne Sayer: Treating Juliette Standre/Extender: Veneta Penton in Treatment: 3 Multidisciplinary Care Plan reviewed with physician Active Inactive Abuse / Safety / Falls / Self Care Management Nursing Diagnoses: History of Falls Impaired physical mobility Potential for injury related to falls Goals: Patient will not experience any injury related to falls Date Initiated: 12/11/2020 Target Resolution Date: 01/12/2021 Goal Status: Active Patient/caregiver will verbalize/demonstrate measures taken to prevent injury and/or falls Date Initiated: 12/11/2020 Target Resolution Date: 01/12/2021 Goal Status: Active Interventions: Assess Activities of Daily Living upon admission and as needed Assess fall risk on admission and as needed Assess: immobility, friction, shearing, incontinence upon admission and as needed Assess impairment of mobility on admission and as needed per policy Patient/Caregiver referred to community resources (specify in notes) Provide  education on fall prevention Provide education on personal and home safety Notes: Nutrition Nursing Diagnoses: Impaired glucose control: actual or potential Potential for alteratiion in Nutrition/Potential for imbalanced nutrition Goals: Patient/caregiver agrees to and verbalizes understanding of need to use nutritional supplements and/or vitamins as prescribed Date Initiated: 12/11/2020 Target Resolution Date: 01/12/2021 Goal Status: Active Patient/caregiver will maintain therapeutic glucose control Date Initiated: 12/11/2020 Target Resolution Date: 01/12/2021 Goal Status: Active Interventions: Assess HgA1c results as ordered upon admission and as needed Assess patient nutrition upon admission and as needed per policy Provide education on elevated blood sugars and impact on wound healing Provide education on nutrition Treatment Activities: Education provided on Nutrition : 12/11/2020 Notes: Pressure Nursing Diagnoses: Knowledge deficit related to causes and risk factors for pressure ulcer development Knowledge deficit related to management of pressures ulcers Potential for impaired tissue integrity related to pressure, friction, moisture, and shear Goals: Patient/caregiver will verbalize risk factors for pressure ulcer development Date Initiated: 12/11/2020 Target Resolution Date: 01/12/2021 Goal Status: Active Patient/caregiver will verbalize understanding of pressure ulcer management Date Initiated: 12/11/2020 Target Resolution Date: 01/12/2021 Goal Status: Active Interventions: Assess: immobility, friction, shearing, incontinence upon admission and as needed Assess offloading mechanisms upon admission and as needed Assess potential for pressure ulcer upon admission and as needed Provide education on pressure ulcers Notes: Wound/Skin Impairment Nursing Diagnoses:  Impaired tissue integrity Knowledge deficit related to ulceration/compromised skin  integrity Goals: Patient/caregiver will verbalize understanding of skin care regimen Date Initiated: 12/11/2020 Target Resolution Date: 01/12/2021 Goal Status: Active Ulcer/skin breakdown will have a volume reduction of 30% by week 4 Date Initiated: 12/11/2020 Target Resolution Date: 01/12/2021 Goal Status: Active Interventions: Assess patient/caregiver ability to obtain necessary supplies Assess patient/caregiver ability to perform ulcer/skin care regimen upon admission and as needed Assess ulceration(s) every visit Provide education on ulcer and skin care Notes: Electronic Signature(s) Signed: 01/01/2021 5:41:15 PM By: Levan Hurst RN, BSN Entered By: Levan Hurst on 01/01/2021 17:22:55 -------------------------------------------------------------------------------- Pain Assessment Details Patient Name: Date of Service: [name] Left SA [name] N. 01/01/2021 2:15 PM Medical Record Number: [id number] Patient Account Number: [id number] Date of Birth/Sex: Treating RN: 1928-08-03 (87 y.o. [name] Small Primary Care Gianah Batt: Leonides Cave Other Clinician: Referring Leenah Seidner: Treating Latamara Melder/Extender: Earnest Conroy Weeks in Treatment: 3 Active Problems Location of Pain Severity and Description of Pain Patient Has Paino No Site Locations Pain Management and Medication Current Pain Management: Electronic Signature(s) Signed: 01/02/2021 5:43:17 PM By: Rhae Hammock RN Entered By: Rhae Hammock on 01/01/2021 14:28:32 -------------------------------------------------------------------------------- Patient/Caregiver Education Details Patient Name: Date of Service: Enid Baas 2/21/2022andnbsp2:15 PM Medical Record Number: [id number] Patient Account Number: [id number] Date of Birth/Gender: Treating RN: 1928/05/07 (87 y.o. [name] Small Primary Care Physician: Leonides Cave Other Clinician: Referring Physician: Treating  Physician/Extender: Veneta Penton in Treatment: 3 Education Assessment Education Provided To: Patient Education Topics Provided Wound/Skin Impairment: Methods: Explain/Verbal Responses: State content correctly Electronic Signature(s) Signed: 01/01/2021 5:41:15 PM By: Levan Hurst RN, BSN Entered By: Levan Hurst on 01/01/2021 17:23:06 -------------------------------------------------------------------------------- Wound Assessment Details Patient Name: Date of Service: [name] Left SA [name] N. 01/01/2021 2:15 PM Medical Record Number: [id number] Patient Account Number: [id number] Date of Birth/Sex: Treating RN: 02-27-1928 (87 y.o. [name] Small, [place] Primary Care Moustapha Tooker: Leonides Cave Other Clinician: Referring Jeffrie Lofstrom: Treating Lemmie Steinhaus/Extender: Earnest Conroy Weeks in Treatment: 3 Wound Status Wound Number: 1 Primary Diabetic Wound/Ulcer of the Lower Extremity Etiology: Wound Location: Right Calcaneus Secondary Pressure Ulcer Wounding Event: Pressure Injury Etiology: Date Acquired: 08/11/2020 Wound Open Weeks Of Treatment: 3 Status: Clustered Wound: No Comorbid Chronic Obstructive Pulmonary Disease (COPD), Congestive History: Heart Failure, Coronary Artery Disease, Hypertension, Peripheral Arterial Disease, Type II Diabetes, Neuropathy Wound Measurements Length: (cm) 1.9 Width: (cm) 3.1 Depth: (cm) 0.5 Area: (cm) 4.626 Volume: (cm) 2.313 % Reduction in Area: 36.3% % Reduction in Volume: -218.6% Epithelialization: Small (1-33%) Tunneling: No Undermining: No Wound Description Classification: Grade 2 Wound Margin: Distinct, outline attached Exudate Amount: Medium Exudate Type: Serosanguineous Exudate Color: red, brown Foul Odor After Cleansing: No Slough/Fibrino Yes Wound Bed Granulation Amount: Small (1-33%) Exposed Structure Granulation Quality: Pink, Pale Fascia Exposed: No Necrotic Amount:  Large (67-100%) Fat Layer (Subcutaneous Tissue) Exposed: Yes Necrotic Quality: Eschar, Adherent Slough Tendon Exposed: No Muscle Exposed: No Joint Exposed: No Bone Exposed: No Treatment Notes Wound #1 (Calcaneus) Wound Laterality: Right Cleanser Wound Cleanser Discharge Instruction: Cleanse the wound with wound cleanser or normal saline prior to applying a clean dressing using gauze sponges, not tissue or cotton balls. Peri-Wound Care Topical Primary Dressing Santyl Ointment Discharge Instruction: Apply nickel thick amount to wound bed as instructed Saline moistened gauze over Santyl Secondary Dressing Woven Gauze Sponge, Non-Sterile 4x4 in Discharge Instruction: Apply over primary dressing as directed. ALLEVYN Heel 4 1/2in x 5 1/2in / 10.5cm x 13.5cm Discharge Instruction:  Apply over primary dressing as directed. Secured With [employer], 4.5x3.1 (in/yd) Discharge Instruction: Secure with Kerlix as directed. Paper Tape, 2x10 (in/yd) Discharge Instruction: Secure dressing with tape as directed. Compression Wrap Compression Stockings Add-Ons Electronic Signature(s) Signed: 01/02/2021 5:43:17 PM By: Rhae Hammock RN Entered By: Rhae Hammock on 01/01/2021 14:34:01 -------------------------------------------------------------------------------- Vitals Details Patient Name: Date of Service: [name] Left SA [name] N. 01/01/2021 2:15 PM Medical Record Number: [id number] Patient Account Number: [id number] Date of Birth/Sex: Treating RN: 04/29/1928 (87 y.o. [name] Small Primary Care Theon Sobotka: Leonides Cave Other Clinician: Referring Annajulia Lewing: Treating Riddick Nuon/Extender: Earnest Conroy Weeks in Treatment: 3 Vital Signs Time Taken: 14:27 Temperature (F): 98.4 Height (in): 74 Pulse (bpm): 88 Weight (lbs): 205 Respiratory Rate (breaths/min): 17 Body Mass Index (BMI): 26.3 Blood Pressure (mmHg): 105/61 Capillary Blood Glucose  (mg/dl): 140 Reference Range: 80 - 120 mg / dl Electronic Signature(s) Signed: 01/02/2021 5:43:17 PM By: Rhae Hammock RN Entered By: Rhae Hammock on 01/01/2021 14:28:19

## 2021-01-06 DIAGNOSIS — L97412 Non-pressure chronic ulcer of right heel and midfoot with fat layer exposed: Secondary | ICD-10-CM | POA: Diagnosis not present

## 2021-01-06 DIAGNOSIS — E1142 Type 2 diabetes mellitus with diabetic polyneuropathy: Secondary | ICD-10-CM | POA: Diagnosis not present

## 2021-01-06 DIAGNOSIS — I110 Hypertensive heart disease with heart failure: Secondary | ICD-10-CM | POA: Diagnosis not present

## 2021-01-06 DIAGNOSIS — E11621 Type 2 diabetes mellitus with foot ulcer: Secondary | ICD-10-CM | POA: Diagnosis not present

## 2021-01-06 DIAGNOSIS — Z9181 History of falling: Secondary | ICD-10-CM | POA: Diagnosis not present

## 2021-01-06 DIAGNOSIS — E785 Hyperlipidemia, unspecified: Secondary | ICD-10-CM | POA: Diagnosis not present

## 2021-01-06 DIAGNOSIS — J439 Emphysema, unspecified: Secondary | ICD-10-CM | POA: Diagnosis not present

## 2021-01-06 DIAGNOSIS — I509 Heart failure, unspecified: Secondary | ICD-10-CM | POA: Diagnosis not present

## 2021-01-06 DIAGNOSIS — Z8551 Personal history of malignant neoplasm of bladder: Secondary | ICD-10-CM | POA: Diagnosis not present

## 2021-01-06 DIAGNOSIS — E1151 Type 2 diabetes mellitus with diabetic peripheral angiopathy without gangrene: Secondary | ICD-10-CM | POA: Diagnosis not present

## 2021-01-08 DIAGNOSIS — J449 Chronic obstructive pulmonary disease, unspecified: Secondary | ICD-10-CM | POA: Diagnosis not present

## 2021-01-08 DIAGNOSIS — J9600 Acute respiratory failure, unspecified whether with hypoxia or hypercapnia: Secondary | ICD-10-CM | POA: Diagnosis not present

## 2021-01-08 DIAGNOSIS — I509 Heart failure, unspecified: Secondary | ICD-10-CM | POA: Diagnosis not present

## 2021-01-08 DIAGNOSIS — R062 Wheezing: Secondary | ICD-10-CM | POA: Diagnosis not present

## 2021-01-09 DIAGNOSIS — Z96649 Presence of unspecified artificial hip joint: Secondary | ICD-10-CM | POA: Diagnosis not present

## 2021-01-09 DIAGNOSIS — M978XXA Periprosthetic fracture around other internal prosthetic joint, initial encounter: Secondary | ICD-10-CM | POA: Diagnosis not present

## 2021-01-10 DIAGNOSIS — E785 Hyperlipidemia, unspecified: Secondary | ICD-10-CM | POA: Diagnosis not present

## 2021-01-10 DIAGNOSIS — Z8551 Personal history of malignant neoplasm of bladder: Secondary | ICD-10-CM | POA: Diagnosis not present

## 2021-01-10 DIAGNOSIS — E1142 Type 2 diabetes mellitus with diabetic polyneuropathy: Secondary | ICD-10-CM | POA: Diagnosis not present

## 2021-01-10 DIAGNOSIS — I509 Heart failure, unspecified: Secondary | ICD-10-CM | POA: Diagnosis not present

## 2021-01-10 DIAGNOSIS — L97412 Non-pressure chronic ulcer of right heel and midfoot with fat layer exposed: Secondary | ICD-10-CM | POA: Diagnosis not present

## 2021-01-10 DIAGNOSIS — J439 Emphysema, unspecified: Secondary | ICD-10-CM | POA: Diagnosis not present

## 2021-01-10 DIAGNOSIS — Z9181 History of falling: Secondary | ICD-10-CM | POA: Diagnosis not present

## 2021-01-10 DIAGNOSIS — I110 Hypertensive heart disease with heart failure: Secondary | ICD-10-CM | POA: Diagnosis not present

## 2021-01-10 DIAGNOSIS — E1151 Type 2 diabetes mellitus with diabetic peripheral angiopathy without gangrene: Secondary | ICD-10-CM | POA: Diagnosis not present

## 2021-01-10 DIAGNOSIS — E11621 Type 2 diabetes mellitus with foot ulcer: Secondary | ICD-10-CM | POA: Diagnosis not present

## 2021-01-12 DIAGNOSIS — E1142 Type 2 diabetes mellitus with diabetic polyneuropathy: Secondary | ICD-10-CM | POA: Diagnosis not present

## 2021-01-12 DIAGNOSIS — Z9181 History of falling: Secondary | ICD-10-CM | POA: Diagnosis not present

## 2021-01-12 DIAGNOSIS — I110 Hypertensive heart disease with heart failure: Secondary | ICD-10-CM | POA: Diagnosis not present

## 2021-01-12 DIAGNOSIS — E1151 Type 2 diabetes mellitus with diabetic peripheral angiopathy without gangrene: Secondary | ICD-10-CM | POA: Diagnosis not present

## 2021-01-12 DIAGNOSIS — L97412 Non-pressure chronic ulcer of right heel and midfoot with fat layer exposed: Secondary | ICD-10-CM | POA: Diagnosis not present

## 2021-01-12 DIAGNOSIS — I509 Heart failure, unspecified: Secondary | ICD-10-CM | POA: Diagnosis not present

## 2021-01-12 DIAGNOSIS — E785 Hyperlipidemia, unspecified: Secondary | ICD-10-CM | POA: Diagnosis not present

## 2021-01-12 DIAGNOSIS — J439 Emphysema, unspecified: Secondary | ICD-10-CM | POA: Diagnosis not present

## 2021-01-12 DIAGNOSIS — E11621 Type 2 diabetes mellitus with foot ulcer: Secondary | ICD-10-CM | POA: Diagnosis not present

## 2021-01-12 DIAGNOSIS — Z8551 Personal history of malignant neoplasm of bladder: Secondary | ICD-10-CM | POA: Diagnosis not present

## 2021-01-16 DIAGNOSIS — E785 Hyperlipidemia, unspecified: Secondary | ICD-10-CM | POA: Diagnosis not present

## 2021-01-16 DIAGNOSIS — I509 Heart failure, unspecified: Secondary | ICD-10-CM | POA: Diagnosis not present

## 2021-01-16 DIAGNOSIS — Z8551 Personal history of malignant neoplasm of bladder: Secondary | ICD-10-CM | POA: Diagnosis not present

## 2021-01-16 DIAGNOSIS — E11621 Type 2 diabetes mellitus with foot ulcer: Secondary | ICD-10-CM | POA: Diagnosis not present

## 2021-01-16 DIAGNOSIS — E1151 Type 2 diabetes mellitus with diabetic peripheral angiopathy without gangrene: Secondary | ICD-10-CM | POA: Diagnosis not present

## 2021-01-16 DIAGNOSIS — L97412 Non-pressure chronic ulcer of right heel and midfoot with fat layer exposed: Secondary | ICD-10-CM | POA: Diagnosis not present

## 2021-01-16 DIAGNOSIS — I110 Hypertensive heart disease with heart failure: Secondary | ICD-10-CM | POA: Diagnosis not present

## 2021-01-16 DIAGNOSIS — E1142 Type 2 diabetes mellitus with diabetic polyneuropathy: Secondary | ICD-10-CM | POA: Diagnosis not present

## 2021-01-16 DIAGNOSIS — J439 Emphysema, unspecified: Secondary | ICD-10-CM | POA: Diagnosis not present

## 2021-01-16 DIAGNOSIS — Z9181 History of falling: Secondary | ICD-10-CM | POA: Diagnosis not present

## 2021-01-16 LAB — BASIC METABOLIC PANEL
Creatinine: 2.3 — AB (ref <=1.3)
Glucose: 280
Potassium: 4.7 (ref 3.4–5.3)

## 2021-01-16 LAB — CBC AND DIFFERENTIAL: Hemoglobin: 13.7 (ref 13.5–17.5)

## 2021-01-16 LAB — HEMOGLOBIN A1C: Hemoglobin A1C: 8.3

## 2021-01-18 DIAGNOSIS — E1151 Type 2 diabetes mellitus with diabetic peripheral angiopathy without gangrene: Secondary | ICD-10-CM | POA: Diagnosis not present

## 2021-01-18 DIAGNOSIS — Z8551 Personal history of malignant neoplasm of bladder: Secondary | ICD-10-CM | POA: Diagnosis not present

## 2021-01-18 DIAGNOSIS — Z9181 History of falling: Secondary | ICD-10-CM | POA: Diagnosis not present

## 2021-01-18 DIAGNOSIS — E785 Hyperlipidemia, unspecified: Secondary | ICD-10-CM | POA: Diagnosis not present

## 2021-01-18 DIAGNOSIS — I110 Hypertensive heart disease with heart failure: Secondary | ICD-10-CM | POA: Diagnosis not present

## 2021-01-18 DIAGNOSIS — L97412 Non-pressure chronic ulcer of right heel and midfoot with fat layer exposed: Secondary | ICD-10-CM | POA: Diagnosis not present

## 2021-01-18 DIAGNOSIS — E1142 Type 2 diabetes mellitus with diabetic polyneuropathy: Secondary | ICD-10-CM | POA: Diagnosis not present

## 2021-01-18 DIAGNOSIS — E11621 Type 2 diabetes mellitus with foot ulcer: Secondary | ICD-10-CM | POA: Diagnosis not present

## 2021-01-18 DIAGNOSIS — I509 Heart failure, unspecified: Secondary | ICD-10-CM | POA: Diagnosis not present

## 2021-01-18 DIAGNOSIS — J439 Emphysema, unspecified: Secondary | ICD-10-CM | POA: Diagnosis not present

## 2021-01-20 DIAGNOSIS — J449 Chronic obstructive pulmonary disease, unspecified: Secondary | ICD-10-CM | POA: Diagnosis not present

## 2021-01-20 DIAGNOSIS — J9600 Acute respiratory failure, unspecified whether with hypoxia or hypercapnia: Secondary | ICD-10-CM | POA: Diagnosis not present

## 2021-01-20 DIAGNOSIS — I509 Heart failure, unspecified: Secondary | ICD-10-CM | POA: Diagnosis not present

## 2021-01-20 DIAGNOSIS — R062 Wheezing: Secondary | ICD-10-CM | POA: Diagnosis not present

## 2021-01-22 DIAGNOSIS — J439 Emphysema, unspecified: Secondary | ICD-10-CM | POA: Diagnosis not present

## 2021-01-22 DIAGNOSIS — E785 Hyperlipidemia, unspecified: Secondary | ICD-10-CM | POA: Diagnosis not present

## 2021-01-22 DIAGNOSIS — I509 Heart failure, unspecified: Secondary | ICD-10-CM | POA: Diagnosis not present

## 2021-01-22 DIAGNOSIS — Z8551 Personal history of malignant neoplasm of bladder: Secondary | ICD-10-CM | POA: Diagnosis not present

## 2021-01-22 DIAGNOSIS — E1142 Type 2 diabetes mellitus with diabetic polyneuropathy: Secondary | ICD-10-CM | POA: Diagnosis not present

## 2021-01-22 DIAGNOSIS — E1151 Type 2 diabetes mellitus with diabetic peripheral angiopathy without gangrene: Secondary | ICD-10-CM | POA: Diagnosis not present

## 2021-01-22 DIAGNOSIS — E11621 Type 2 diabetes mellitus with foot ulcer: Secondary | ICD-10-CM | POA: Diagnosis not present

## 2021-01-22 DIAGNOSIS — Z9181 History of falling: Secondary | ICD-10-CM | POA: Diagnosis not present

## 2021-01-22 DIAGNOSIS — I110 Hypertensive heart disease with heart failure: Secondary | ICD-10-CM | POA: Diagnosis not present

## 2021-01-22 DIAGNOSIS — L97412 Non-pressure chronic ulcer of right heel and midfoot with fat layer exposed: Secondary | ICD-10-CM | POA: Diagnosis not present

## 2021-01-23 ENCOUNTER — Other Ambulatory Visit (HOSPITAL_COMMUNITY)
Admission: RE | Admit: 2021-01-23 | Discharge: 2021-01-23 | Disposition: A | Discharge status: Home / Self Care | Payer: Medicare Other | Source: Other Acute Inpatient Hospital | Attending: Internal Medicine | Admitting: Internal Medicine

## 2021-01-23 ENCOUNTER — Encounter (HOSPITAL_BASED_OUTPATIENT_CLINIC_OR_DEPARTMENT_OTHER): Payer: Medicare Other | Attending: Internal Medicine | Admitting: Internal Medicine

## 2021-01-23 ENCOUNTER — Other Ambulatory Visit: Payer: Self-pay

## 2021-01-23 DIAGNOSIS — Z8551 Personal history of malignant neoplasm of bladder: Secondary | ICD-10-CM | POA: Diagnosis not present

## 2021-01-23 DIAGNOSIS — L97412 Non-pressure chronic ulcer of right heel and midfoot with fat layer exposed: Secondary | ICD-10-CM | POA: Diagnosis not present

## 2021-01-23 DIAGNOSIS — E1142 Type 2 diabetes mellitus with diabetic polyneuropathy: Secondary | ICD-10-CM | POA: Diagnosis not present

## 2021-01-23 DIAGNOSIS — I509 Heart failure, unspecified: Secondary | ICD-10-CM | POA: Insufficient documentation

## 2021-01-23 DIAGNOSIS — Z794 Long term (current) use of insulin: Secondary | ICD-10-CM | POA: Diagnosis not present

## 2021-01-23 DIAGNOSIS — L03115 Cellulitis of right lower limb: Secondary | ICD-10-CM | POA: Insufficient documentation

## 2021-01-23 DIAGNOSIS — E11621 Type 2 diabetes mellitus with foot ulcer: Secondary | ICD-10-CM | POA: Insufficient documentation

## 2021-01-23 DIAGNOSIS — E1151 Type 2 diabetes mellitus with diabetic peripheral angiopathy without gangrene: Secondary | ICD-10-CM | POA: Insufficient documentation

## 2021-01-23 DIAGNOSIS — L97418 Non-pressure chronic ulcer of right heel and midfoot with other specified severity: Secondary | ICD-10-CM | POA: Diagnosis not present

## 2021-01-23 DIAGNOSIS — I110 Hypertensive heart disease with heart failure: Secondary | ICD-10-CM | POA: Diagnosis not present

## 2021-01-24 ENCOUNTER — Other Ambulatory Visit (HOSPITAL_COMMUNITY): Payer: Self-pay | Admitting: Internal Medicine

## 2021-01-24 DIAGNOSIS — S91309A Unspecified open wound, unspecified foot, initial encounter: Secondary | ICD-10-CM

## 2021-01-24 NOTE — Progress Notes (Signed)
[name] Small, [name] Small ([id number]) Visit Report for 01/23/2021 Arrival Information Details Patient Name: Date of Service: [name] Small, [name] Small [place] RIO N. 01/23/2021 2:15 PM Medical Record Number: [id number] Patient Account Number: [id number] Date of Birth/Sex: Treating RN: 1927/11/27 (87 y.o. [name] Small Primary Care Linkin Vizzini: Leonides Cave Other Clinician: Referring Vedder Brittian: Treating Rangel Echeverri/Extender: Earnest Conroy Weeks in Treatment: 6 Visit Information History Since Last Visit Added or deleted any medications: No Patient Arrived: Wheel Chair Any new allergies or adverse reactions: No Arrival Time: 14:30 Had a fall or experienced change in No Accompanied By: spouse activities of daily living that may affect Transfer Assistance: None risk of falls: Patient Identification Verified: Yes Signs or symptoms of abuse/neglect since last visito No Secondary Verification Process Completed: Yes Hospitalized since last visit: No Patient Requires Transmission-Based Precautions: No Implantable device outside of the clinic excluding No Patient Has Alerts: No cellular tissue based products placed in the center since last visit: Has Dressing in Place as Prescribed: Yes Pain Present Now: No Electronic Signature(s) Signed: 01/23/2021 5:54:58 PM By: Baruch Gouty RN, BSN Entered By: Baruch Gouty on 01/23/2021 14:30:46 -------------------------------------------------------------------------------- Encounter Discharge Information Details Patient Name: Date of Service: [name] Small [place] RIO N. 01/23/2021 2:15 PM Medical Record Number: [id number] Patient Account Number: [id number] Date of Birth/Sex: Treating RN: 1928-09-16 (87 y.o. [name], [name] Primary Care Stepheni Cameron: Leonides Cave Other Clinician: Referring Ornella Coderre: Treating Letisha Yera/Extender: Earnest Conroy Weeks in Treatment: 6 Encounter Discharge Information Items Post Procedure  Vitals Discharge Condition: Stable Temperature (F): 98.3 Ambulatory Status: Wheelchair Pulse (bpm): 68 Discharge Destination: Home Respiratory Rate (breaths/min): 18 Transportation: Private Auto Blood Pressure (mmHg): 157/62 Accompanied By: Spouse Schedule Follow-up Appointment: Yes Clinical Summary of Care: Provided on 01/23/2021 Form Type Recipient Paper Patient Patient Electronic Signature(s) Signed: 01/23/2021 4:15:54 PM By: Lorrin Jackson Previous Signature: 01/23/2021 3:48:58 PM Version By: Lorrin Jackson Entered By: Lorrin Jackson on 01/23/2021 16:15:54 -------------------------------------------------------------------------------- Lower Extremity Assessment Details Patient Name: Date of Service: [name] Small [place] RIO N. 01/23/2021 2:15 PM Medical Record Number: [id number] Patient Account Number: [id number] Date of Birth/Sex: Treating RN: 06/08/1928 (87 y.o. [name] Small Primary Care Teofilo Lupinacci: Leonides Cave Other Clinician: Referring Rheanne Cortopassi: Treating Jahel Wavra/Extender: Earnest Conroy Weeks in Treatment: 6 Edema Assessment Assessed: Shirlyn Goltz: No] Patrice Paradise: No] Edema: [Small: N] [Right: o] Calf Small: Right: Point of Measurement: 33 cm From Medial Instep 36 cm Ankle Small: Right: Point of Measurement: 12 cm From Medial Instep 21 cm Vascular Assessment Pulses: Dorsalis Pedis Palpable: [Right:No] Electronic Signature(s) Signed: 01/23/2021 5:54:58 PM By: Baruch Gouty RN, BSN Entered By: Baruch Gouty on 01/23/2021 14:33:12 -------------------------------------------------------------------------------- Multi Wound Chart Details Patient Name: Date of Service: [name] Small [place] RIO N. 01/23/2021 2:15 PM Medical Record Number: [id number] Patient Account Number: [id number] Date of Birth/Sex: Treating RN: 07-Nov-1928 (87 y.o. [name], [name] Primary Care Evon Dejarnett: Leonides Cave Other Clinician: Referring Kaelei Wheeler: Treating  Dmiya Malphrus/Extender: Earnest Conroy Weeks in Treatment: 6 Vital Signs Height(in): 74 Capillary Blood Glucose(mg/dl): 119 Weight(lbs): 205 Pulse(bpm): 28 Body Mass Index(BMI): 26 Blood Pressure(mmHg): 157/62 Temperature(F): 98.3 Respiratory Rate(breaths/min): 18 Photos: [1:No Photos Right Calcaneus] [N/A:N/A N/A] Wound Location: [1:Pressure Injury] [N/A:N/A] Wounding Event: [1:Diabetic Wound/Ulcer of the Lower] [N/A:N/A] Primary Etiology: [1:Extremity Pressure Ulcer] [N/A:N/A] Secondary Etiology: [1:Chronic Obstructive Pulmonary] [N/A:N/A] Comorbid History: [1:Disease (COPD), Congestive Heart Failure, Coronary Artery Disease, Hypertension, Peripheral Arterial Disease, Type II Diabetes, Neuropathy 08/11/2020] [N/A:N/A] Date Acquired: [1:6] [N/A:N/A] Weeks of Treatment: [1:Open] [N/A:N/A] Wound Status: [1:1.3x3x1] [N/A:N/A]  Measurements L x W x D (cm) [1:3.063] [N/A:N/A] A (cm) : rea [1:3.063] [N/A:N/A] Volume (cm) : [1:57.80%] [N/A:N/A] % Reduction in A [1:rea: -321.90%] [N/A:N/A] % Reduction in Volume: [1:Grade 2] [N/A:N/A] Classification: [1:Medium] [N/A:N/A] Exudate A mount: [1:Serosanguineous] [N/A:N/A] Exudate Type: [1:red, brown] [N/A:N/A] Exudate Color: [1:Epibole] [N/A:N/A] Wound Margin: [1:Small (1-33%)] [N/A:N/A] Granulation A mount: [1:Red, Pale] [N/A:N/A] Granulation Quality: [1:Large (67-100%)] [N/A:N/A] Necrotic A mount: [1:Fat Layer (Subcutaneous Tissue): Yes N/A] Exposed Structures: [1:Fascia: No Tendon: No Muscle: No Joint: No Bone: No Small (1-33%)] [N/A:N/A] Epithelialization: [1:Debridement - Excisional] [N/A:N/A] Debridement: Pre-procedure Verification/Time Out 15:57 [N/A:N/A] Taken: [1:Lidocaine] [N/A:N/A] Pain Control: [1:Subcutaneous, Slough] [N/A:N/A] Tissue Debrided: [1:Skin/Subcutaneous Tissue] [N/A:N/A] Level: [1:3.9] [N/A:N/A] Debridement A (sq cm): [1:rea Blade, Curette, Forceps] [N/A:N/A] Instrument: [1:Minimum]  [N/A:N/A] Bleeding: [1:Pressure] [N/A:N/A] Hemostasis A chieved: [1:0] [N/A:N/A] Procedural Pain: [1:0] [N/A:N/A] Post Procedural Pain: [1:Procedure was tolerated well] [N/A:N/A] Debridement Treatment Response: [1:1.3x3x1] [N/A:N/A] Post Debridement Measurements L x W x D (cm) [1:3.063] [N/A:N/A] Post Debridement Volume: (cm) [1:Debridement] [N/A:N/A] Treatment Notes Electronic Signature(s) Signed: 01/23/2021 5:48:16 PM By: Linton Ham MD Signed: 01/24/2021 5:17:38 PM By: Rhae Hammock RN Entered By: Linton Ham on 01/23/2021 16:09:10 -------------------------------------------------------------------------------- Multi-Disciplinary Care Plan Details Patient Name: Date of Service: [name] Small [place] RIO N. 01/23/2021 2:15 PM Medical Record Number: [id number] Patient Account Number: [id number] Date of Birth/Sex: Treating RN: 04-29-1928 (87 y.o. [name], [place] Primary Care Shaylene Paganelli: Leonides Cave Other Clinician: Referring Garin Mata: Treating Hatem Cull/Extender: Veneta Penton in Treatment: 6 Multidisciplinary Care Plan reviewed with physician Active Inactive Abuse / Safety / Falls / Self Care Management Nursing Diagnoses: History of Falls Impaired physical mobility Potential for injury related to falls Goals: Patient will not experience any injury related to falls Date Initiated: 12/11/2020 Target Resolution Date: 01/12/2021 Goal Status: Active Patient/caregiver will verbalize/demonstrate measures taken to prevent injury and/or falls Date Initiated: 12/11/2020 Target Resolution Date: 01/12/2021 Goal Status: Active Interventions: Assess Activities of Daily Living upon admission and as needed Assess fall risk on admission and as needed Assess: immobility, friction, shearing, incontinence upon admission and as needed Assess impairment of mobility on admission and as needed per policy Patient/Caregiver referred to community resources  (specify in notes) Provide education on fall prevention Provide education on personal and home safety Notes: Nutrition Nursing Diagnoses: Impaired glucose control: actual or potential Potential for alteratiion in Nutrition/Potential for imbalanced nutrition Goals: Patient/caregiver agrees to and verbalizes understanding of need to use nutritional supplements and/or vitamins as prescribed Date Initiated: 12/11/2020 Target Resolution Date: 01/12/2021 Goal Status: Active Patient/caregiver will maintain therapeutic glucose control Date Initiated: 12/11/2020 Target Resolution Date: 01/12/2021 Goal Status: Active Interventions: Assess HgA1c results as ordered upon admission and as needed Assess patient nutrition upon admission and as needed per policy Provide education on elevated blood sugars and impact on wound healing Provide education on nutrition Treatment Activities: Education provided on Nutrition : 12/11/2020 Notes: Pressure Nursing Diagnoses: Knowledge deficit related to causes and risk factors for pressure ulcer development Knowledge deficit related to management of pressures ulcers Potential for impaired tissue integrity related to pressure, friction, moisture, and shear Goals: Patient/caregiver will verbalize risk factors for pressure ulcer development Date Initiated: 12/11/2020 Target Resolution Date: 01/12/2021 Goal Status: Active Patient/caregiver will verbalize understanding of pressure ulcer management Date Initiated: 12/11/2020 Target Resolution Date: 01/12/2021 Goal Status: Active Interventions: Assess: immobility, friction, shearing, incontinence upon admission and as needed Assess offloading mechanisms upon admission and as needed Assess potential for pressure ulcer upon admission and as needed Provide education  on pressure ulcers Notes: Wound/Skin Impairment Nursing Diagnoses: Impaired tissue integrity Knowledge deficit related to ulceration/compromised skin  integrity Goals: Patient/caregiver will verbalize understanding of skin care regimen Date Initiated: 12/11/2020 Target Resolution Date: 01/12/2021 Goal Status: Active Ulcer/skin breakdown will have a volume reduction of 30% by week 4 Date Initiated: 12/11/2020 Target Resolution Date: 01/12/2021 Goal Status: Active Interventions: Assess patient/caregiver ability to obtain necessary supplies Assess patient/caregiver ability to perform ulcer/skin care regimen upon admission and as needed Assess ulceration(s) every visit Provide education on ulcer and skin care Notes: Electronic Signature(s) Signed: 01/24/2021 5:17:38 PM By: Rhae Hammock RN Entered By: Rhae Hammock on 01/23/2021 17:32:46 -------------------------------------------------------------------------------- Pain Assessment Details Patient Name: Date of Service: [name] Small [place] RIO N. 01/23/2021 2:15 PM Medical Record Number: [id number] Patient Account Number: [id number] Date of Birth/Sex: Treating RN: Apr 14, 1928 (87 y.o. [name] Small Primary Care Yvonnia Tango: Leonides Cave Other Clinician: Referring Kalis Friese: Treating Elmore Hyslop/Extender: Earnest Conroy Weeks in Treatment: 6 Active Problems Location of Pain Severity and Description of Pain Patient Has Paino No Site Locations With Dressing Change: No Duration of the Pain. Constant / Intermittento Intermittent Rate the pain. Current Pain Level: 0 Character of Pain Describe the Pain: Tender Pain Management and Medication Current Pain Management: Electronic Signature(s) Signed: 01/23/2021 5:54:58 PM By: Baruch Gouty RN, BSN Entered By: Baruch Gouty on 01/23/2021 14:32:36 -------------------------------------------------------------------------------- Patient/Caregiver Education Details Patient Name: Date of Service: [name] Small 3/15/2022andnbsp2:15 PM Medical Record Number: [id number] Patient Account Number:  [id number] Date of Birth/Gender: Treating RN: 1928/10/16 (87 y.o. [name], [place] Primary Care Physician: Leonides Cave Other Clinician: Referring Physician: Treating Physician/Extender: Veneta Penton in Treatment: 6 Education Assessment Education Provided To: Patient Education Topics Provided Wound/Skin Impairment: Methods: Explain/Verbal Responses: State content correctly Electronic Signature(s) Signed: 01/24/2021 5:17:38 PM By: Rhae Hammock RN Entered By: Rhae Hammock on 01/23/2021 17:32:58 -------------------------------------------------------------------------------- Wound Assessment Details Patient Name: Date of Service: [name] Small [place] RIO N. 01/23/2021 2:15 PM Medical Record Number: [id number] Patient Account Number: [id number] Date of Birth/Sex: Treating RN: 1928/01/18 (87 y.o. [name] Small Primary Care Dian Laprade: Leonides Cave Other Clinician: Referring Teylor Wolven: Treating Kimimila Tauzin/Extender: Earnest Conroy Weeks in Treatment: 6 Wound Status Wound Number: 1 Primary Diabetic Wound/Ulcer of the Lower Extremity Etiology: Wound Location: Right Calcaneus Secondary Pressure Ulcer Wounding Event: Pressure Injury Etiology: Date Acquired: 08/11/2020 Wound Open Weeks Of Treatment: 6 Status: Clustered Wound: No Comorbid Chronic Obstructive Pulmonary Disease (COPD), Congestive History: Heart Failure, Coronary Artery Disease, Hypertension, Peripheral Arterial Disease, Type II Diabetes, Neuropathy Photos Wound Measurements Length: (cm) 1.3 Width: (cm) 3 Depth: (cm) 1 Area: (cm) 3.063 Volume: (cm) 3.063 % Reduction in Area: 57.8% % Reduction in Volume: -321.9% Epithelialization: Small (1-33%) Tunneling: No Undermining: No Wound Description Classification: Grade 2 Wound Margin: Epibole Exudate Amount: Medium Exudate Type: Serosanguineous Exudate Color: red, brown Foul Odor After  Cleansing: No Slough/Fibrino Yes Wound Bed Granulation Amount: Small (1-33%) Exposed Structure Granulation Quality: Red, Pale Fascia Exposed: No Necrotic Amount: Large (67-100%) Fat Layer (Subcutaneous Tissue) Exposed: Yes Necrotic Quality: Adherent Slough Tendon Exposed: No Muscle Exposed: No Joint Exposed: No Bone Exposed: No Treatment Notes Wound #1 (Calcaneus) Wound Laterality: Right Cleanser Wound Cleanser Discharge Instruction: Cleanse the wound with wound cleanser or normal saline prior to applying a clean dressing using gauze sponges, not tissue or cotton balls. Peri-Wound Care Topical Primary Dressing Santyl Ointment Discharge Instruction: Apply nickel thick amount to wound bed as instructed Saline moistened gauze  over Santyl Secondary Dressing Woven Gauze Sponge, Non-Sterile 4x4 in Discharge Instruction: Apply over primary dressing as directed. ALLEVYN Heel 4 1/2in x 5 1/2in / 10.5cm x 13.5cm Discharge Instruction: Apply over primary dressing as directed. Secured With [employer], 4.5x3.1 (in/yd) Discharge Instruction: Secure with Kerlix as directed. Paper Tape, 2x10 (in/yd) Discharge Instruction: Secure dressing with tape as directed. Compression Wrap Compression Stockings Add-Ons Electronic Signature(s) Signed: 01/23/2021 5:27:17 PM By: Sandre Kitty Signed: 01/23/2021 5:54:58 PM By: Baruch Gouty RN, BSN Entered By: Sandre Kitty on 01/23/2021 17:01:30 -------------------------------------------------------------------------------- Vitals Details Patient Name: Date of Service: [name] Small [place] RIO N. 01/23/2021 2:15 PM Medical Record Number: [id number] Patient Account Number: [id number] Date of Birth/Sex: Treating RN: [birth year] (87 y.o. [name] Small Primary Care Stasha Naraine: Other Clinician: Leonides Cave Referring Shlomie Romig: Treating Kingdom Vanzanten/Extender: Earnest Conroy Weeks in Treatment: 6 Vital Signs Time  Taken: 14:31 Temperature (F): 98.3 Height (in): 74 Pulse (bpm): 68 Source: Stated Respiratory Rate (breaths/min): 18 Weight (lbs): 205 Blood Pressure (mmHg): 157/62 Source: Stated Capillary Blood Glucose (mg/dl): 119 Body Mass Index (BMI): 26.3 Reference Range: 80 - 120 mg / dl Notes glucose per pt report this am Electronic Signature(s) Signed: 01/23/2021 5:54:58 PM By: Baruch Gouty RN, BSN Entered By: Baruch Gouty on 01/23/2021 14:31:55

## 2021-01-25 DIAGNOSIS — I110 Hypertensive heart disease with heart failure: Secondary | ICD-10-CM | POA: Diagnosis not present

## 2021-01-25 DIAGNOSIS — E1142 Type 2 diabetes mellitus with diabetic polyneuropathy: Secondary | ICD-10-CM | POA: Diagnosis not present

## 2021-01-25 DIAGNOSIS — Z9181 History of falling: Secondary | ICD-10-CM | POA: Diagnosis not present

## 2021-01-25 DIAGNOSIS — Z8551 Personal history of malignant neoplasm of bladder: Secondary | ICD-10-CM | POA: Diagnosis not present

## 2021-01-25 DIAGNOSIS — J439 Emphysema, unspecified: Secondary | ICD-10-CM | POA: Diagnosis not present

## 2021-01-25 DIAGNOSIS — E11621 Type 2 diabetes mellitus with foot ulcer: Secondary | ICD-10-CM | POA: Diagnosis not present

## 2021-01-25 DIAGNOSIS — E1151 Type 2 diabetes mellitus with diabetic peripheral angiopathy without gangrene: Secondary | ICD-10-CM | POA: Diagnosis not present

## 2021-01-25 DIAGNOSIS — E785 Hyperlipidemia, unspecified: Secondary | ICD-10-CM | POA: Diagnosis not present

## 2021-01-25 DIAGNOSIS — I509 Heart failure, unspecified: Secondary | ICD-10-CM | POA: Diagnosis not present

## 2021-01-25 DIAGNOSIS — L97412 Non-pressure chronic ulcer of right heel and midfoot with fat layer exposed: Secondary | ICD-10-CM | POA: Diagnosis not present

## 2021-01-25 NOTE — Progress Notes (Signed)
THADIUS, SMISEK ([id number]) Visit Report for 01/23/2021 Debridement Details Patient Name: Date of Service: BOBBYJOE, PABST [place] RIO N. 01/23/2021 2:15 PM Medical Record Number: [id number] Patient Account Number: [id number] Date of Birth/Sex: Treating RN: [birth year] (87 y.o. [name], [name] Primary Care Provider: Leonides Cave Other Clinician: Referring Provider: Treating Provider/Extender: Earnest Conroy Weeks in Treatment: 6 Debridement Performed for Assessment: Wound #1 Right Calcaneus Performed By: Physician Ricard Dillon., MD Debridement Type: Debridement Severity of Tissue Pre Debridement: Fat layer exposed Level of Consciousness (Pre-procedure): Awake and Alert Pre-procedure Verification/Time Out Yes - 15:57 Taken: Start Time: 15:57 Pain Control: Lidocaine T Area Debrided (L x W): otal 1.3 (cm) x 3 (cm) = 3.9 (cm) Tissue and other material debrided: Viable, Non-Viable, Slough, Subcutaneous, Skin: Dermis , Slough Level: Skin/Subcutaneous Tissue Debridement Description: Excisional Instrument: Blade, Curette, Forceps Specimen: Tissue Culture Number of Specimens T aken: 1 Bleeding: Minimum Hemostasis Achieved: Pressure End Time: 15:58 Procedural Pain: 0 Post Procedural Pain: 0 Response to Treatment: Procedure was tolerated well Level of Consciousness (Post- Awake and Alert procedure): Post Debridement Measurements of Total Wound Length: (cm) 1.3 Width: (cm) 3 Depth: (cm) 1 Volume: (cm) 3.063 Character of Wound/Ulcer Post Debridement: Improved Severity of Tissue Post Debridement: Fat layer exposed Post Procedure Diagnosis Same as Pre-procedure Electronic Signature(s) Signed: 01/23/2021 5:48:16 PM By: Linton Ham MD Signed: 01/24/2021 5:17:38 PM By: Rhae Hammock RN Entered By: Linton Ham on 01/23/2021 16:09:23 -------------------------------------------------------------------------------- HPI Details Patient Name: Date of  Service: [name] 01/23/2021 2:15 PM Medical Record Number: [id number] Patient Account Number: [id number] Date of Birth/Sex: Treating RN: Sep 25, 1928 (87 y.o. [name], [place] Primary Care Provider: Leonides Cave Other Clinician: Referring Provider: Treating Provider/Extender: Lina Sayre, Purcell Mouton Weeks in Treatment: 6 History of Present Illness HPI Description: ADMISSION 12/11/2020 This is a 87 year old man who apparently fractured his left hip in June and then had a subsequent fall and femur fracture requiring a separate surgery. He spent a long time at rehab at [facility] skilled facility. Sometime in October his wife noted a blister on the left heel that is gradually morphed into a pressure ulcer. He saw his primary physician on 11/07/2020 and he was referred here. The patient had prior problems with PAD on the left. His last arterial studies in September 2020 showed an ABI on the right of 1.03 with a TBI of 0.73 and biphasic waveforms on the left a ABI of 0.84, TBI's were not done because the toe was surgically absent. He had monophasic and biphasic waveforms. In 2017 he had an angiogram showing a mom diffusely diseased SFA and occluded left popliteal he underwent a left popliteal and left peroneal artery angioplasty. Noteworthy on the LEFT the peroneal was the only patent vessel below the knee. I do not think his right side was actually studied Past medical history; type 2 diabetes on insulin with peripheral neuropathy, hypertension, hyperlipidemia, hip fracture and subsequent femur fracture is noted, and emphysema, history of bladder cancer requiring in and out catheterizations, coronary artery disease, BPH. Updated ABI in our clinic was 0.93 on the right 2/14 Santyl to continue. Mechanical debridement 2/21; using Santyl. Still requiring mechanical debridement today which was disappointing. 3/15; 3-week follow-up. Using Santyl but still requiring mechanical  debridements in the same spot in this wound. Most of the rest of the surface of this wound looks satisfactory however in the most proximal part filled with mostly necrotic tissue. Electronic Signature(s) Signed: 01/23/2021 5:48:16 PM By:  Linton Ham MD Entered By: Linton Ham on 01/23/2021 16:10:10 -------------------------------------------------------------------------------- Physical Exam Details Patient Name: Date of Service: AMED, DATTA 01/23/2021 2:15 PM Medical Record Number: [id number] Patient Account Number: [id number] Date of Birth/Sex: Treating RN: Jan 10, 1928 (87 y.o. [place] Primary Care Provider: Leonides Cave Other Clinician: Referring Provider: Treating Provider/Extender: Earnest Conroy Weeks in Treatment: 6 Constitutional Patient is hypotensive.. Pulse regular and within target range for patient.Marland Kitchen Respirations regular, non-labored and within target range.. Temperature is normal and within the target range for the patient.Marland Kitchen Appears in no distress. Notes On the rightWound exam; he over 30 Achilles tip. The most proximal part of the wound has a completely nonviable surface this is been true ever since he came here. I used pickups and a #15 scalpel to remove necrotic subcutaneous tissue and then a #3 curette to try to get to a clean surface. Fortunately there does not appear to be exposed bone. Post debridement I did of wound culture Electronic Signature(s) Signed: 01/23/2021 5:48:16 PM By: Linton Ham MD Entered By: Linton Ham on 01/23/2021 16:11:14 -------------------------------------------------------------------------------- Physician Orders Details Patient Name: Date of Service: [name] 01/23/2021 2:15 PM Medical Record Number: [id number] Patient Account Number: [id number] Date of Birth/Sex: Treating RN: 03/04/1928 (87 y.o. [name], [name] Primary Care Provider: Leonides Cave Other  Clinician: Referring Provider: Treating Provider/Extender: Earnest Conroy Weeks in Treatment: 6 Verbal / Phone Orders: No Diagnosis Coding Follow-up Appointments Return Appointment in 2 weeks. Bathing/ Shower/ Hygiene May shower and wash wound with soap and water. - prior to dressing change Off-Loading Heel suspension boot to: - Gloped heel offloading sandal to right foot Turn and reposition every 2 hours Other: - float heels off of bed and chair by placing pillow under calves Home Health No change in wound care orders this week; continue [facility] for wound care. May utilize formulary equivalent dressing for wound treatment orders unless otherwise specified. Other Home Health Orders/Instructions: - [facility] for wound care 2-3x a week Wound Treatment Wound #1 - Calcaneus Wound Laterality: Right Cleanser: Wound Cleanser 1 x Per CHY/85 Days Discharge Instructions: Cleanse the wound with wound cleanser or normal saline prior to applying a clean dressing using gauze sponges, not tissue or cotton balls. Prim Dressing: Santyl Ointment 1 x Per Day/15 Days ary Discharge Instructions: Apply nickel thick amount to wound bed as instructed Prim Dressing: Saline moistened gauze over Santyl ary 1 x Per Day/15 Days Secondary Dressing: Woven Gauze Sponge, Non-Sterile 4x4 in (Generic) 1 x Per Day/15 Days Discharge Instructions: Apply over primary dressing as directed. Secondary Dressing: ALLEVYN Heel 4 1/2in x 5 1/2in / 10.5cm x 13.5cm (Generic) 1 x Per Day/15 Days Discharge Instructions: Apply over primary dressing as directed. Secured With: [employer], 4.5x3.1 (in/yd) (Generic) 1 x Per Day/15 Days Discharge Instructions: Secure with Kerlix as directed. Secured With: Paper Tape, 2x10 (in/yd) (Generic) 1 x Per Day/15 Days Discharge Instructions: Secure dressing with tape as directed. Laboratory naerobe culture (MICRO) - Right heel Bacteria identified in  Unspecified specimen by A LOINC Code: 027-7 Convenience Name: Anerobic culture Radiology X-ray, foot - right foot Electronic Signature(s) Signed: 01/23/2021 5:48:16 PM By: Linton Ham MD Signed: 01/25/2021 5:54:24 PM By: Levan Hurst RN, BSN Entered By: Levan Hurst on 01/23/2021 16:39:56 Prescription 01/23/2021 -------------------------------------------------------------------------------- Lockie Pares MD Patient Name: Provider: [birth year] [id number] Date of Birth: NPI#[place] Sex: DEA #: [phone] [id number] Phone #: License #:  [place] Patient Address: [address], [place] [address] [place],  [address] [phone] Allergies ferrous sulfate; Actos; aspirin; pravastatin; gabapentin; Bactrim; ACE Inhibitors; Lyrica; Sulfa (Sulfonamide Antibiotics) Provider's Orders X-ray, foot - right foot Hand Signature: Date(s): Electronic Signature(s) Signed: 01/23/2021 5:48:16 PM By: Linton Ham MD Signed: 01/25/2021 5:54:24 PM By: Levan Hurst RN, BSN Entered By: Levan Hurst on 01/23/2021 16:39:56 -------------------------------------------------------------------------------- Problem List Details Patient Name: Date of Service: [name] 01/23/2021 2:15 PM Medical Record Number: [id number] Patient Account Number: [id number] Date of Birth/Sex: Treating RN: May 13, 1928 (87 y.o. [name], [name] Primary Care Provider: Leonides Cave Other Clinician: Referring Provider: Treating Provider/Extender: Lina Sayre, Purcell Mouton Weeks in Treatment: 6 Active Problems ICD-10 Encounter Code Description Active Date MDM Diagnosis E11.621 Type 2 diabetes mellitus with foot ulcer 12/11/2020 No Yes L97.418 Non-pressure chronic ulcer of right heel and midfoot with other specified 12/11/2020 No Yes severity E11.51 Type 2 diabetes mellitus with  diabetic peripheral angiopathy without gangrene 12/11/2020 No Yes E11.42 Type 2 diabetes mellitus with diabetic polyneuropathy 12/11/2020 No Yes Inactive Problems Resolved Problems Electronic Signature(s) Signed: 01/23/2021 5:48:16 PM By: Linton Ham MD Entered By: Linton Ham on 01/23/2021 16:09:04 -------------------------------------------------------------------------------- Progress Note Details Patient Name: Date of Service: [name] 01/23/2021 2:15 PM Medical Record Number: [id number] Patient Account Number: [id number] Date of Birth/Sex: Treating RN: 04/14/1928 (87 y.o. [name], [place] Primary Care Provider: Leonides Cave Other Clinician: Referring Provider: Treating Provider/Extender: Lina Sayre, Purcell Mouton Weeks in Treatment: 6 Subjective History of Present Illness (HPI) ADMISSION 12/11/2020 This is a 87 year old man who apparently fractured his left hip in June and then had a subsequent fall and femur fracture requiring a separate surgery. He spent a long time at rehab at [facility] skilled facility. Sometime in October his wife noted a blister on the left heel that is gradually morphed into a pressure ulcer. He saw his primary physician on 11/07/2020 and he was referred here. The patient had prior problems with PAD on the left. His last arterial studies in September 2020 showed an ABI on the right of 1.03 with a TBI of 0.73 and biphasic waveforms on the left a ABI of 0.84, TBI's were not done because the toe was surgically absent. He had monophasic and biphasic waveforms. In 2017 he had an angiogram showing a mom diffusely diseased SFA and occluded left popliteal he underwent a left popliteal and left peroneal artery angioplasty. Noteworthy on the LEFT the peroneal was the only patent vessel below the knee. I do not think his right side was actually studied Past medical history; type 2 diabetes on insulin with peripheral neuropathy,  hypertension, hyperlipidemia, hip fracture and subsequent femur fracture is noted, and emphysema, history of bladder cancer requiring in and out catheterizations, coronary artery disease, BPH. Updated ABI in our clinic was 0.93 on the right 2/14 Santyl to continue. Mechanical debridement 2/21; using Santyl. Still requiring mechanical debridement today which was disappointing. 3/15; 3-week follow-up. Using Santyl but still requiring mechanical debridements in the same spot in this wound. Most of the rest of the surface of this wound looks satisfactory however in the most proximal part filled with mostly necrotic tissue. Objective Constitutional Patient is hypotensive.. Pulse regular and within target range for patient.Marland Kitchen Respirations regular, non-labored and within target range.. Temperature is normal and within the target range for the patient.Marland Kitchen Appears in no distress. Vitals Time Taken:  2:31 PM, Height: 74 in, Source: Stated, Weight: 205 lbs, Source: Stated, BMI: 26.3, Temperature: 98.3 F, Pulse: 68 bpm, Respiratory Rate: 18 breaths/min, Blood Pressure: 157/62 mmHg, Capillary Blood Glucose: 119 mg/dl. General Notes: glucose per pt report this am General Notes: On the rightWound exam; he over 30 Achilles tip. The most proximal part of the wound has a completely nonviable surface this is been true ever since he came here. I used pickups and a #15 scalpel to remove necrotic subcutaneous tissue and then a #3 curette to try to get to a clean surface. Fortunately there does not appear to be exposed bone. Post debridement I did of wound culture Integumentary (Hair, Skin) Wound #1 status is Open. Original cause of wound was Pressure Injury. The date acquired was: 08/11/2020. The wound has been in treatment 6 weeks. The wound is located on the Right Calcaneus. The wound measures 1.3cm length x 3cm width x 1cm depth; 3.063cm^2 area and 3.063cm^3 volume. There is Fat Layer (Subcutaneous Tissue) exposed.  There is no tunneling or undermining noted. There is a medium amount of serosanguineous drainage noted. The wound margin is epibole. There is small (1-33%) red, pale granulation within the wound bed. There is a large (67-100%) amount of necrotic tissue within the wound bed including Adherent Slough. Assessment Active Problems ICD-10 Type 2 diabetes mellitus with foot ulcer Non-pressure chronic ulcer of right heel and midfoot with other specified severity Type 2 diabetes mellitus with diabetic peripheral angiopathy without gangrene Type 2 diabetes mellitus with diabetic polyneuropathy Procedures Wound #1 Pre-procedure diagnosis of Wound #1 is a Diabetic Wound/Ulcer of the Lower Extremity located on the Right Calcaneus .Severity of Tissue Pre Debridement is: Fat layer exposed. There was a Excisional Skin/Subcutaneous Tissue Debridement with a total area of 3.9 sq cm performed by Ricard Dillon., MD. With the following instrument(s): Blade, Curette, and Forceps to remove Viable and Non-Viable tissue/material. Material removed includes Subcutaneous Tissue, Slough, and Skin: Dermis after achieving pain control using Lidocaine. 1 specimen was taken by a Tissue Culture and sent to the lab per facility protocol. A time out was conducted at 15:57, prior to the start of the procedure. A Minimum amount of bleeding was controlled with Pressure. The procedure was tolerated well with a pain level of 0 throughout and a pain level of 0 following the procedure. Post Debridement Measurements: 1.3cm length x 3cm width x 1cm depth; 3.063cm^3 volume. Character of Wound/Ulcer Post Debridement is improved. Severity of Tissue Post Debridement is: Fat layer exposed. Post procedure Diagnosis Wound #1: Same as Pre-Procedure Plan Follow-up Appointments: Return Appointment in 2 weeks. Bathing/ Shower/ Hygiene: May shower and wash wound with soap and water. - prior to dressing change Off-Loading: Heel suspension  boot to: - Gloped heel offloading sandal to right foot Turn and reposition every 2 hours Other: - float heels off of bed and chair by placing pillow under calves Home Health: No change in wound care orders this week; continue [facility] for wound care. May utilize formulary equivalent dressing for wound treatment orders unless otherwise specified. Other Home Health Orders/Instructions: - [facility] for wound care 2-3x a week WOUND #1: - Calcaneus Wound Laterality: Right Cleanser: Wound Cleanser 1 x Per ZGY/17 Days Discharge Instructions: Cleanse the wound with wound cleanser or normal saline prior to applying a clean dressing using gauze sponges, not tissue or cotton balls. Prim Dressing: Santyl Ointment 1 x Per Day/15 Days ary Discharge Instructions: Apply nickel thick amount to wound bed as instructed Prim  Dressing: Saline moistened gauze over Santyl 1 x Per Day/15 Days ary Secondary Dressing: Woven Gauze Sponge, Non-Sterile 4x4 in (Generic) 1 x Per Day/15 Days Discharge Instructions: Apply over primary dressing as directed. Secondary Dressing: ALLEVYN Heel 4 1/2in x 5 1/2in / 10.5cm x 13.5cm (Generic) 1 x Per Day/15 Days Discharge Instructions: Apply over primary dressing as directed. Secured With: [employer], 4.5x3.1 (in/yd) (Generic) 1 x Per Day/15 Days Discharge Instructions: Secure with Kerlix as directed. Secured With: Paper T ape, 2x10 (in/yd) (Generic) 1 x Per Day/15 Days Discharge Instructions: Secure dressing with tape as directed. 1. I am going to continue the Santyl 2. X-ray and culture of the heel. 3. We will see him again in 2 weeks if there is no progress we will definitely need to change the dressing here. Question Sorbact 4. He had an ordinarily running shoe on this foot although he is mostly nonambulatory. Still I am a little concerned about the adequacy of the pressure relief here although his wife says that this is adequate Electronic Signature(s) Signed:  01/23/2021 5:48:16 PM By: Linton Ham MD Entered By: Linton Ham on 01/23/2021 16:12:13 -------------------------------------------------------------------------------- SuperBill Details Patient Name: Date of Service: [name] 01/23/2021 Medical Record Number: [id number] Patient Account Number: [id number] Date of Birth/Sex: Treating RN: January 28, 1928 (87 y.o. [name], [name] Primary Care Provider: Leonides Cave Other Clinician: Referring Provider: Treating Provider/Extender: Lina Sayre, Purcell Mouton Weeks in Treatment: 6 Diagnosis Coding ICD-10 Codes Code Description [phone] Type 2 diabetes mellitus with foot ulcer L97.418 Non-pressure chronic ulcer of right heel and midfoot with other specified severity E11.51 Type 2 diabetes mellitus with diabetic peripheral angiopathy without gangrene E11.42 Type 2 diabetes mellitus with diabetic polyneuropathy Facility Procedures CPT4 Code: [id number] Description: 60045 - DEB SUBQ TISSUE 20 SQ CM/< ICD-10 Diagnosis Description L97.418 Non-pressure chronic ulcer of right heel and midfoot with other specified sev Modifier: erity Quantity: 1 Physician Procedures : CPT4 Code Description Modifier [id number] - WC PHYS SUBQ TISS 20 SQ CM ICD-10 Diagnosis Description L97.418 Non-pressure chronic ulcer of right heel and midfoot with other specified severity Quantity: 1 Electronic Signature(s) Signed: 01/23/2021 5:48:16 PM By: Linton Ham MD Entered By: Linton Ham on 01/23/2021 16:12:27

## 2021-01-26 LAB — AEROBIC CULTURE W GRAM STAIN (SUPERFICIAL SPECIMEN): Gram Stain: NONE SEEN

## 2021-01-29 DIAGNOSIS — Z9181 History of falling: Secondary | ICD-10-CM | POA: Diagnosis not present

## 2021-01-29 DIAGNOSIS — E1142 Type 2 diabetes mellitus with diabetic polyneuropathy: Secondary | ICD-10-CM | POA: Diagnosis not present

## 2021-01-29 DIAGNOSIS — E785 Hyperlipidemia, unspecified: Secondary | ICD-10-CM | POA: Diagnosis not present

## 2021-01-29 DIAGNOSIS — Z8551 Personal history of malignant neoplasm of bladder: Secondary | ICD-10-CM | POA: Diagnosis not present

## 2021-01-29 DIAGNOSIS — I509 Heart failure, unspecified: Secondary | ICD-10-CM | POA: Diagnosis not present

## 2021-01-29 DIAGNOSIS — I110 Hypertensive heart disease with heart failure: Secondary | ICD-10-CM | POA: Diagnosis not present

## 2021-01-29 DIAGNOSIS — E11621 Type 2 diabetes mellitus with foot ulcer: Secondary | ICD-10-CM | POA: Diagnosis not present

## 2021-01-29 DIAGNOSIS — J439 Emphysema, unspecified: Secondary | ICD-10-CM | POA: Diagnosis not present

## 2021-01-29 DIAGNOSIS — E1151 Type 2 diabetes mellitus with diabetic peripheral angiopathy without gangrene: Secondary | ICD-10-CM | POA: Diagnosis not present

## 2021-01-29 DIAGNOSIS — L97412 Non-pressure chronic ulcer of right heel and midfoot with fat layer exposed: Secondary | ICD-10-CM | POA: Diagnosis not present

## 2021-01-30 DIAGNOSIS — M9702XD Periprosthetic fracture around internal prosthetic left hip joint, subsequent encounter: Secondary | ICD-10-CM | POA: Diagnosis not present

## 2021-01-30 DIAGNOSIS — S72332D Displaced oblique fracture of shaft of left femur, subsequent encounter for closed fracture with routine healing: Secondary | ICD-10-CM | POA: Diagnosis not present

## 2021-02-02 DIAGNOSIS — I509 Heart failure, unspecified: Secondary | ICD-10-CM | POA: Diagnosis not present

## 2021-02-02 DIAGNOSIS — E1142 Type 2 diabetes mellitus with diabetic polyneuropathy: Secondary | ICD-10-CM | POA: Diagnosis not present

## 2021-02-02 DIAGNOSIS — E11621 Type 2 diabetes mellitus with foot ulcer: Secondary | ICD-10-CM | POA: Diagnosis not present

## 2021-02-02 DIAGNOSIS — L97412 Non-pressure chronic ulcer of right heel and midfoot with fat layer exposed: Secondary | ICD-10-CM | POA: Diagnosis not present

## 2021-02-02 DIAGNOSIS — Z9181 History of falling: Secondary | ICD-10-CM | POA: Diagnosis not present

## 2021-02-02 DIAGNOSIS — I110 Hypertensive heart disease with heart failure: Secondary | ICD-10-CM | POA: Diagnosis not present

## 2021-02-02 DIAGNOSIS — J439 Emphysema, unspecified: Secondary | ICD-10-CM | POA: Diagnosis not present

## 2021-02-02 DIAGNOSIS — E1151 Type 2 diabetes mellitus with diabetic peripheral angiopathy without gangrene: Secondary | ICD-10-CM | POA: Diagnosis not present

## 2021-02-02 DIAGNOSIS — Z8551 Personal history of malignant neoplasm of bladder: Secondary | ICD-10-CM | POA: Diagnosis not present

## 2021-02-02 DIAGNOSIS — E785 Hyperlipidemia, unspecified: Secondary | ICD-10-CM | POA: Diagnosis not present

## 2021-02-05 ENCOUNTER — Other Ambulatory Visit: Payer: Self-pay

## 2021-02-05 ENCOUNTER — Ambulatory Visit (HOSPITAL_COMMUNITY)
Admission: RE | Admit: 2021-02-05 | Discharge: 2021-02-05 | Disposition: A | Discharge status: Home / Self Care | Payer: Medicare Other | Source: Ambulatory Visit | Attending: Internal Medicine | Admitting: Internal Medicine

## 2021-02-05 ENCOUNTER — Inpatient Hospital Stay (HOSPITAL_COMMUNITY): Admit: 2021-02-05 | Payer: Medicare Other

## 2021-02-05 DIAGNOSIS — Z9181 History of falling: Secondary | ICD-10-CM | POA: Diagnosis not present

## 2021-02-05 DIAGNOSIS — S91301A Unspecified open wound, right foot, initial encounter: Secondary | ICD-10-CM | POA: Diagnosis not present

## 2021-02-05 DIAGNOSIS — E11621 Type 2 diabetes mellitus with foot ulcer: Secondary | ICD-10-CM | POA: Diagnosis not present

## 2021-02-05 DIAGNOSIS — L97412 Non-pressure chronic ulcer of right heel and midfoot with fat layer exposed: Secondary | ICD-10-CM | POA: Diagnosis not present

## 2021-02-05 DIAGNOSIS — E1151 Type 2 diabetes mellitus with diabetic peripheral angiopathy without gangrene: Secondary | ICD-10-CM | POA: Diagnosis not present

## 2021-02-05 DIAGNOSIS — E1142 Type 2 diabetes mellitus with diabetic polyneuropathy: Secondary | ICD-10-CM | POA: Diagnosis not present

## 2021-02-05 DIAGNOSIS — I509 Heart failure, unspecified: Secondary | ICD-10-CM | POA: Diagnosis not present

## 2021-02-05 DIAGNOSIS — S91309A Unspecified open wound, unspecified foot, initial encounter: Secondary | ICD-10-CM | POA: Insufficient documentation

## 2021-02-05 DIAGNOSIS — I110 Hypertensive heart disease with heart failure: Secondary | ICD-10-CM | POA: Diagnosis not present

## 2021-02-05 DIAGNOSIS — Z8551 Personal history of malignant neoplasm of bladder: Secondary | ICD-10-CM | POA: Diagnosis not present

## 2021-02-05 DIAGNOSIS — I708 Atherosclerosis of other arteries: Secondary | ICD-10-CM | POA: Diagnosis not present

## 2021-02-05 DIAGNOSIS — J439 Emphysema, unspecified: Secondary | ICD-10-CM | POA: Diagnosis not present

## 2021-02-05 DIAGNOSIS — E785 Hyperlipidemia, unspecified: Secondary | ICD-10-CM | POA: Diagnosis not present

## 2021-02-06 ENCOUNTER — Encounter (HOSPITAL_BASED_OUTPATIENT_CLINIC_OR_DEPARTMENT_OTHER): Payer: Medicare Other | Admitting: Internal Medicine

## 2021-02-06 DIAGNOSIS — Z794 Long term (current) use of insulin: Secondary | ICD-10-CM | POA: Diagnosis not present

## 2021-02-06 DIAGNOSIS — E1142 Type 2 diabetes mellitus with diabetic polyneuropathy: Secondary | ICD-10-CM | POA: Diagnosis not present

## 2021-02-06 DIAGNOSIS — Z8551 Personal history of malignant neoplasm of bladder: Secondary | ICD-10-CM | POA: Diagnosis not present

## 2021-02-06 DIAGNOSIS — L03115 Cellulitis of right lower limb: Secondary | ICD-10-CM | POA: Diagnosis not present

## 2021-02-06 DIAGNOSIS — E1151 Type 2 diabetes mellitus with diabetic peripheral angiopathy without gangrene: Secondary | ICD-10-CM | POA: Diagnosis not present

## 2021-02-06 DIAGNOSIS — E11621 Type 2 diabetes mellitus with foot ulcer: Secondary | ICD-10-CM | POA: Diagnosis not present

## 2021-02-06 DIAGNOSIS — I110 Hypertensive heart disease with heart failure: Secondary | ICD-10-CM | POA: Diagnosis not present

## 2021-02-06 DIAGNOSIS — L97412 Non-pressure chronic ulcer of right heel and midfoot with fat layer exposed: Secondary | ICD-10-CM | POA: Diagnosis not present

## 2021-02-06 DIAGNOSIS — L97418 Non-pressure chronic ulcer of right heel and midfoot with other specified severity: Secondary | ICD-10-CM | POA: Diagnosis not present

## 2021-02-06 DIAGNOSIS — I509 Heart failure, unspecified: Secondary | ICD-10-CM | POA: Diagnosis not present

## 2021-02-07 DIAGNOSIS — I509 Heart failure, unspecified: Secondary | ICD-10-CM | POA: Diagnosis not present

## 2021-02-07 DIAGNOSIS — J449 Chronic obstructive pulmonary disease, unspecified: Secondary | ICD-10-CM | POA: Diagnosis not present

## 2021-02-07 DIAGNOSIS — J9600 Acute respiratory failure, unspecified whether with hypoxia or hypercapnia: Secondary | ICD-10-CM | POA: Diagnosis not present

## 2021-02-07 DIAGNOSIS — R062 Wheezing: Secondary | ICD-10-CM | POA: Diagnosis not present

## 2021-02-08 NOTE — Progress Notes (Signed)
GENEVA, PALLAS ([id number]) Visit Report for 02/06/2021 Arrival Information Details Patient Name: Date of Service: [name] Small, [name] Small [place] RIO N. 02/06/2021 12:30 PM Medical Record Number: [id number] Patient Account Number: [id number] Date of Birth/Sex: Treating RN: 1928-05-21 (87 y.o. [name] Small Primary Care [name] Small: [name] Small Other Clinician: Referring [name] Small: Treating [name] Small/Extender: [name] Small in Treatment: 8 Visit Information History Since Last Visit Added or deleted any medications: No Patient Arrived: Wheel Chair Any new allergies or adverse reactions: No Arrival Time: 12:45 Had a fall or experienced change in No Accompanied By: wife activities of daily living that may affect Transfer Assistance: Manual risk of falls: Patient Identification Verified: Yes Signs or symptoms of abuse/neglect since last visito No Secondary Verification Process Completed: Yes Hospitalized since last visit: No Patient Requires Transmission-Based Precautions: No Implantable device outside of the clinic excluding No Patient Has Alerts: No cellular tissue based products placed in the center since last visit: Has Dressing in Place as Prescribed: Yes Has Compression in Place as Prescribed: Yes Pain Present Now: No Electronic Signature(s) Signed: 02/08/2021 5:46:28 PM By: Levan Hurst RN, BSN Entered By: Levan Hurst on 02/06/2021 12:45:53 -------------------------------------------------------------------------------- Encounter Discharge Information Details Patient Name: Date of Service: [name] [name] 02/06/2021 12:30 PM Medical Record Number: [id number] Patient Account Number: [id number] Date of Birth/Sex: Treating RN: 1928-01-12 (87 y.o. [name] Primary Care [name] Small: [name] Small Other Clinician: Referring [name] Small: Treating [name] Small/Extender: [name] Small in Treatment: 8 Encounter Discharge  Information Items Post Procedure Vitals Discharge Condition: Stable Temperature (F): 98.3 Ambulatory Status: Wheelchair Pulse (bpm): 76 Discharge Destination: Home Respiratory Rate (breaths/min): 18 Transportation: Private Auto Blood Pressure (mmHg): 136/72 Accompanied By: Wife Schedule Follow-up Appointment: Yes Clinical Summary of Care: Provided on 02/06/2021 Form Type Recipient Paper Patient Patient Electronic Signature(s) Signed: 02/06/2021 5:12:57 PM By: Lorrin Jackson Entered By: Lorrin Jackson on 02/06/2021 13:38:34 -------------------------------------------------------------------------------- Lower Extremity Assessment Details Patient Name: Date of Service: [name] [name] 02/06/2021 12:30 PM Medical Record Number: [id number] Patient Account Number: [id number] Date of Birth/Sex: Treating RN: 06-30-1928 (87 y.o. [name] Small Primary Care Oluwatosin Higginson: [name] Small Other Clinician: Referring Shields Pautz: Treating Bhavin Monjaraz/Extender: [name] Small in Treatment: 8 Edema Assessment Assessed: Shirlyn Goltz: No] [name] Small: No] Edema: [Left: N] [Right: o] Calf Left: Right: Point of Measurement: 33 cm From Medial Instep 32.4 cm Ankle Left: Right: Point of Measurement: 12 cm From Medial Instep 22.3 cm Vascular Assessment Pulses: Dorsalis Pedis Palpable: [Right:Yes] Electronic Signature(s) Signed: 02/08/2021 5:46:28 PM By: Levan Hurst RN, BSN Entered By: Levan Hurst on 02/06/2021 12:53:40 -------------------------------------------------------------------------------- Multi Wound Chart Details Patient Name: Date of Service: [name] [name] 02/06/2021 12:30 PM Medical Record Number: [id number] Patient Account Number: [id number] Date of Birth/Sex: Treating RN: October 07, 1928 (87 y.o. [name], [name] Small Primary Care Naria Abbey: [name] Small Other Clinician: Referring Lulie Hurd: Treating Devonn Giampietro/Extender: [name] Small in Treatment: 8 Vital Signs Height(in): 74 Capillary Blood Glucose(mg/dl): 93 Weight(lbs): 205 Pulse(bpm): 66 Body Mass Index(BMI): 26 Blood Pressure(mmHg): 136/72 Temperature(F): 98.3 Respiratory Rate(breaths/min): 18 Photos: [1:No Photos Right Calcaneus] [N/A:N/A N/A] Wound Location: [1:Pressure Injury] [N/A:N/A] Wounding Event: [1:Diabetic Wound/Ulcer of the Lower] [N/A:N/A] Primary Etiology: [1:Extremity Pressure Ulcer] [N/A:N/A] Secondary Etiology: [1:Chronic Obstructive Pulmonary] [N/A:N/A] Comorbid History: [1:Disease (COPD), Congestive Heart Failure, Coronary Artery Disease, Hypertension, Peripheral Arterial Disease, Type II Diabetes, Neuropathy 08/11/2020] [N/A:N/A] Date Acquired: [1:8] [N/A:N/A] Small of Treatment: [1:Open] [N/A:N/A] Wound Status: [1:1.5x3x1] [N/A:N/A] Measurements L  x W x D (cm) [1:3.534] [N/A:N/A] A (cm) : rea [1:3.534] [N/A:N/A] Volume (cm) : [1:51.30%] [N/A:N/A] % Reduction in A [1:rea: -386.80%] [N/A:N/A] % Reduction in Volume: [1:9] Starting Position 1 (o'clock): [1:1] Ending Position 1 (o'clock): [1:0.6] Maximum Distance 1 (cm): [1:Yes] [N/A:N/A] Undermining: [1:Grade 2] [N/A:N/A] Classification: [1:Medium] [N/A:N/A] Exudate A mount: [1:Serosanguineous] [N/A:N/A] Exudate Type: [1:red, brown] [N/A:N/A] Exudate Color: [1:Epibole] [N/A:N/A] Wound Margin: [1:Medium (34-66%)] [N/A:N/A] Granulation A mount: [1:Red, Pink] [N/A:N/A] Granulation Quality: [1:Medium (34-66%)] [N/A:N/A] Necrotic A mount: [1:Fat Layer (Subcutaneous Tissue): Yes N/A] Exposed Structures: [1:Fascia: No Tendon: No Muscle: No Joint: No Bone: No Small (1-33%)] [N/A:N/A] Epithelialization: [1:Debridement - Excisional] [N/A:N/A] Debridement: Pre-procedure Verification/Time Out 13:07 [N/A:N/A] Taken: [1:Lidocaine] [N/A:N/A] Pain Control: [1:Subcutaneous, Slough] [N/A:N/A] Tissue Debrided: [1:Skin/Subcutaneous Tissue] [N/A:N/A] Level: [1:4.5]  [N/A:N/A] Debridement A (sq cm): [1:rea Curette] [N/A:N/A] Instrument: [1:Minimum] [N/A:N/A] Bleeding: [1:Pressure] [N/A:N/A] Hemostasis A chieved: [1:0] [N/A:N/A] Procedural Pain: [1:0] [N/A:N/A] Post Procedural Pain: [1:Procedure was tolerated well] [N/A:N/A] Debridement Treatment Response: [1:1.5x3x1] [N/A:N/A] Post Debridement Measurements L x W x D (cm) [1:3.534] [N/A:N/A] Post Debridement Volume: (cm) [1:Debridement] [N/A:N/A] Treatment Notes Electronic Signature(s) Signed: 02/06/2021 5:10:40 PM By: Rhae Hammock RN Signed: 02/08/2021 7:47:56 AM By: Linton Ham MD Entered By: Linton Ham on 02/06/2021 13:19:38 -------------------------------------------------------------------------------- Multi-Disciplinary Care Plan Details Patient Name: Date of Service: [name] [name] 02/06/2021 12:30 PM Medical Record Number: [id number] Patient Account Number: [id number] Date of Birth/Sex: Treating RN: November 13, 1927 (87 y.o. [name], [place] Primary Care Loura Pitt: [name] Small Other Clinician: Referring Adelaine Roppolo: Treating William Laske/Extender: Veneta Penton in Treatment: 8 Multidisciplinary Care Plan reviewed with physician Active Inactive Abuse / Safety / Falls / Self Care Management Nursing Diagnoses: History of Falls Impaired physical mobility Potential for injury related to falls Goals: Patient will not experience any injury related to falls Date Initiated: 12/11/2020 Target Resolution Date: 02/25/2021 Goal Status: Active Patient/caregiver will verbalize/demonstrate measures taken to prevent injury and/or falls Date Initiated: 12/11/2020 Date Inactivated: 02/06/2021 Target Resolution Date: 01/25/2021 Goal Status: Met Interventions: Assess Activities of Daily Living upon admission and as needed Assess fall risk on admission and as needed Assess: immobility, friction, shearing, incontinence upon admission and as needed Assess  impairment of mobility on admission and as needed per policy Patient/Caregiver referred to community resources (specify in notes) Provide education on fall prevention Provide education on personal and home safety Notes: Nutrition Nursing Diagnoses: Impaired glucose control: actual or potential Potential for alteratiion in Nutrition/Potential for imbalanced nutrition Goals: Patient/caregiver agrees to and verbalizes understanding of need to use nutritional supplements and/or vitamins as prescribed Date Initiated: 12/11/2020 Target Resolution Date: 02/25/2021 Goal Status: Active Patient/caregiver will maintain therapeutic glucose control Date Initiated: 12/11/2020 Target Resolution Date: 02/25/2021 Goal Status: Active Interventions: Assess HgA1c results as ordered upon admission and as needed Assess patient nutrition upon admission and as needed per policy Provide education on elevated blood sugars and impact on wound healing Provide education on nutrition Treatment Activities: Education provided on Nutrition : 12/11/2020 Notes: Pressure Nursing Diagnoses: Knowledge deficit related to causes and risk factors for pressure ulcer development Knowledge deficit related to management of pressures ulcers Potential for impaired tissue integrity related to pressure, friction, moisture, and shear Goals: Patient/caregiver will verbalize risk factors for pressure ulcer development Date Initiated: 12/11/2020 Target Resolution Date: 02/25/2021 Goal Status: Active Patient/caregiver will verbalize understanding of pressure ulcer management Date Initiated: 12/11/2020 Target Resolution Date: 02/25/2021 Goal Status: Active Interventions: Assess: immobility, friction, shearing, incontinence upon admission and as needed Assess offloading mechanisms  upon admission and as needed Assess potential for pressure ulcer upon admission and as needed Provide education on pressure ulcers Notes: Wound/Skin  Impairment Nursing Diagnoses: Impaired tissue integrity Knowledge deficit related to ulceration/compromised skin integrity Goals: Patient/caregiver will verbalize understanding of skin care regimen Date Initiated: 12/11/2020 Target Resolution Date: 02/25/2021 Goal Status: Active Ulcer/skin breakdown will have a volume reduction of 30% by week 4 Date Initiated: 12/11/2020 Target Resolution Date: 02/25/2021 Goal Status: Active Interventions: Assess patient/caregiver ability to obtain necessary supplies Assess patient/caregiver ability to perform ulcer/skin care regimen upon admission and as needed Assess ulceration(s) every visit Provide education on ulcer and skin care Notes: Electronic Signature(s) Signed: 02/06/2021 5:10:40 PM By: Rhae Hammock RN Entered By: Rhae Hammock on 02/06/2021 13:17:29 -------------------------------------------------------------------------------- Pain Assessment Details Patient Name: Date of Service: [name] [name] 02/06/2021 12:30 PM Medical Record Number: [id number] Patient Account Number: [id number] Date of Birth/Sex: Treating RN: Sep 25, 1928 (87 y.o. [name] Small Primary Care Wyndham Santilli: [name] Small Other Clinician: Referring Keefe Zawistowski: Treating Kayler Buckholtz/Extender: [name] Small in Treatment: 8 Active Problems Location of Pain Severity and Description of Pain Patient Has Paino No Site Locations Pain Management and Medication Current Pain Management: Electronic Signature(s) Signed: 02/08/2021 5:46:28 PM By: Levan Hurst RN, BSN Entered By: Levan Hurst on 02/06/2021 12:46:27 -------------------------------------------------------------------------------- Patient/Caregiver Education Details Patient Name: Date of Service: [name] Small 3/29/2022andnbsp12:30 PM Medical Record Number: [id number] Patient Account Number: [id number] Date of Birth/Gender: Treating RN: 1928-06-23 (87 y.o.  [name], [name] Small Primary Care Physician: [name] Small Other Clinician: Referring Physician: Treating Physician/Extender: Veneta Penton in Treatment: 8 Education Assessment Education Provided To: Patient Education Topics Provided Wound/Skin Impairment: Electronic Signature(s) Signed: 02/06/2021 5:10:40 PM By: Rhae Hammock RN Entered By: Rhae Hammock on 02/06/2021 13:18:10 -------------------------------------------------------------------------------- Wound Assessment Details Patient Name: Date of Service: [name] [name] 02/06/2021 12:30 PM Medical Record Number: [id number] Patient Account Number: [id number] Date of Birth/Sex: Treating RN: 02-10-1928 (87 y.o. Jonette Eva, Briant Cedar Primary Care Trino Higinbotham: [name] Small Other Clinician: Referring Pieper Kasik: Treating Mackena Plummer/Extender: [name] Small in Treatment: 8 Wound Status Wound Number: 1 Primary Diabetic Wound/Ulcer of the Lower Extremity Etiology: Wound Location: Right Calcaneus Secondary Pressure Ulcer Wounding Event: Pressure Injury Etiology: Date Acquired: 08/11/2020 Wound Open Small Of Treatment: 8 Status: Clustered Wound: No Comorbid Chronic Obstructive Pulmonary Disease (COPD), Congestive History: Heart Failure, Coronary Artery Disease, Hypertension, Peripheral Arterial Disease, Type II Diabetes, Neuropathy Photos Wound Measurements Length: (cm) 1.5 Width: (cm) 3 Depth: (cm) 1 Area: (cm) 3.534 Volume: (cm) 3.534 % Reduction in Area: 51.3% % Reduction in Volume: -386.8% Epithelialization: Small (1-33%) Tunneling: No Undermining: Yes Starting Position (o'clock): 9 Ending Position (o'clock): 1 Maximum Distance: (cm) 0.6 Wound Description Classification: Grade 2 Wound Margin: Epibole Exudate Amount: Medium Exudate Type: Serosanguineous Exudate Color: red, brown Foul Odor After Cleansing: No Slough/Fibrino Yes Wound  Bed Granulation Amount: Medium (34-66%) Exposed Structure Granulation Quality: Red, Pink Fascia Exposed: No Necrotic Amount: Medium (34-66%) Fat Layer (Subcutaneous Tissue) Exposed: Yes Necrotic Quality: Adherent Slough Tendon Exposed: No Muscle Exposed: No Joint Exposed: No Bone Exposed: No Treatment Notes Wound #1 (Calcaneus) Wound Laterality: Right Cleanser Wound Cleanser Discharge Instruction: Cleanse the wound with wound cleanser or normal saline prior to applying a clean dressing using gauze sponges, not tissue or cotton balls. Peri-Wound Care Topical Primary Dressing Promogran Prisma Matrix, 4.34 (sq in) (silver collagen) Discharge Instruction: Moisten collagen with saline or hydrogel Secondary Dressing Woven  Gauze Sponge, Non-Sterile 4x4 in Discharge Instruction: Apply over primary dressing as directed. ALLEVYN Heel 4 1/2in x 5 1/2in / 10.5cm x 13.5cm Discharge Instruction: Apply over primary dressing as directed. Secured With [employer], 4.5x3.1 (in/yd) Discharge Instruction: Secure with Kerlix as directed. Paper Tape, 2x10 (in/yd) Discharge Instruction: Secure dressing with tape as directed. Compression Wrap Compression Stockings Add-Ons Electronic Signature(s) Signed: 02/07/2021 9:05:34 AM By: Sandre Kitty Signed: 02/08/2021 5:46:28 PM By: Levan Hurst RN, BSN Entered By: Sandre Kitty on 02/06/2021 17:02:03 -------------------------------------------------------------------------------- Vitals Details Patient Name: Date of Service: [name] [name] 02/06/2021 12:30 PM Medical Record Number: [id number] Patient Account Number: [id number] Date of Birth/Sex: Treating RN: 1928/10/18 (87 y.o. Jonette Eva, Briant Cedar Primary Care Shedrick Sarli: [name] Small Other Clinician: Referring Bronda Alfred: Treating Shawnell Dykes/Extender: [name] Small in Treatment: 8 Vital Signs Time Taken: 12:45 Temperature (F): 98.3 Height  (in): 74 Pulse (bpm): 76 Weight (lbs): 205 Respiratory Rate (breaths/min): 18 Body Mass Index (BMI): 26.3 Blood Pressure (mmHg): 136/72 Capillary Blood Glucose (mg/dl): 93 Reference Range: 80 - 120 mg / dl Notes glucose per pt report Electronic Signature(s) Signed: 02/08/2021 5:46:28 PM By: Levan Hurst RN, BSN Entered By: Levan Hurst on 02/06/2021 12:46:21

## 2021-02-08 NOTE — Progress Notes (Signed)
TALIK, CASIQUE ([id number]) Visit Report for 02/06/2021 Debridement Details Patient Name: Date of Service: CASY, BRUNETTO [place] RIO N. 02/06/2021 12:30 PM Medical Record Number: [id number] Patient Account Number: [id number] Date of Birth/Sex: Treating RN: 11/22/1927 (87 y.o. [name], [name] Primary Care Provider: Leonides Cave Other Clinician: Referring Provider: Treating Provider/Extender: Earnest Conroy Weeks in Treatment: 8 Debridement Performed for Assessment: Wound #1 Right Calcaneus Performed By: Physician Ricard Dillon., MD Debridement Type: Debridement Severity of Tissue Pre Debridement: Fat layer exposed Level of Consciousness (Pre-procedure): Awake and Alert Pre-procedure Verification/Time Out Yes - 13:07 Taken: Start Time: 13:07 Pain Control: Lidocaine T Area Debrided (L x W): otal 1.5 (cm) x 3 (cm) = 4.5 (cm) Tissue and other material debrided: Viable, Non-Viable, Slough, Subcutaneous, Skin: Dermis , Skin: Epidermis, Slough Level: Skin/Subcutaneous Tissue Debridement Description: Excisional Instrument: Curette Bleeding: Minimum Hemostasis Achieved: Pressure End Time: 13:08 Procedural Pain: 0 Post Procedural Pain: 0 Response to Treatment: Procedure was tolerated well Level of Consciousness (Post- Awake and Alert procedure): Post Debridement Measurements of Total Wound Length: (cm) 1.5 Width: (cm) 3 Depth: (cm) 1 Volume: (cm) 3.534 Character of Wound/Ulcer Post Debridement: Improved Severity of Tissue Post Debridement: Fat layer exposed Post Procedure Diagnosis Same as Pre-procedure Electronic Signature(s) Signed: 02/06/2021 5:10:40 PM By: Rhae Hammock RN Signed: 02/08/2021 7:47:56 AM By: Linton Ham MD Entered By: Linton Ham on 02/06/2021 13:21:19 -------------------------------------------------------------------------------- HPI Details Patient Name: Date of Service: [name] 02/06/2021 12:30  PM Medical Record Number: [id number] Patient Account Number: [id number] Date of Birth/Sex: Treating RN: [birth year] (87 y.o. [name], [place] Primary Care Provider: Leonides Cave Other Clinician: Referring Provider: Treating Provider/Extender: Lina Sayre, Purcell Mouton Weeks in Treatment: 8 History of Present Illness HPI Description: ADMISSION 12/11/2020 This is a 87 year old man who apparently fractured his left hip in June and then had a subsequent fall and femur fracture requiring a separate surgery. He spent a long time at rehab at [facility] skilled facility. Sometime in October his wife noted a blister on the left heel that is gradually morphed into a pressure ulcer. He saw his primary physician on 11/07/2020 and he was referred here. The patient had prior problems with PAD on the left. His last arterial studies in September 2020 showed an ABI on the right of 1.03 with a TBI of 0.73 and biphasic waveforms on the left a ABI of 0.84, TBI's were not done because the toe was surgically absent. He had monophasic and biphasic waveforms. In 2017 he had an angiogram showing a mom diffusely diseased SFA and occluded left popliteal he underwent a left popliteal and left peroneal artery angioplasty. Noteworthy on the LEFT the peroneal was the only patent vessel below the knee. I do not think his right side was actually studied Past medical history; type 2 diabetes on insulin with peripheral neuropathy, hypertension, hyperlipidemia, hip fracture and subsequent femur fracture is noted, and emphysema, history of bladder cancer requiring in and out catheterizations, coronary artery disease, BPH. Updated ABI in our clinic was 0.93 on the right 2/14 Santyl to continue. Mechanical debridement 2/21; using Santyl. Still requiring mechanical debridement today which was disappointing. 3/15; 3-week follow-up. Using Santyl but still requiring mechanical debridements in the same spot in this wound.  Most of the rest of the surface of this wound looks satisfactory however in the most proximal part filled with mostly necrotic tissue. 3/29; 2-week follow-up. They have been using Santyl the patient's wife is changing the dressing as  well as home health. Culture I did showed both Enterococcus and Klebsiella he was treated with 7 days of Augmentin but I am going to extend this. X-ray was negative for osteomyelitis Electronic Signature(s) Signed: 02/08/2021 7:47:56 AM By: Linton Ham MD Entered By: Linton Ham on 02/06/2021 13:20:50 -------------------------------------------------------------------------------- Physical Exam Details Patient Name: Date of Service: [name] 02/06/2021 12:30 PM Medical Record Number: [id number] Patient Account Number: [id number] Date of Birth/Sex: Treating RN: 03-Sep-1928 (87 y.o. [name], [name] Primary Care Provider: Leonides Cave Other Clinician: Referring Provider: Treating Provider/Extender: Earnest Conroy Weeks in Treatment: 8 Constitutional Sitting or standing Blood Pressure is within target range for patient.. Pulse regular and within target range for patient.Marland Kitchen Respirations regular, non-labored and within target range.. Temperature is normal and within the target range for the patient.Marland Kitchen Appears in no distress. Notes Wound exam; tip of his Achilles. Nonviable surface is better however this goes straight downwards a lot more depth than I am used to seeing 2 weeks ago. I used a #5 curette to remove debris on the remaining granulated surface and also some remaining debris in the deep proximal part. Electronic Signature(s) Signed: 02/08/2021 7:47:56 AM By: Linton Ham MD Entered By: Linton Ham on 02/06/2021 13:22:11 -------------------------------------------------------------------------------- Physician Orders Details Patient Name: Date of Service: [name] 02/06/2021 12:30 PM Medical  Record Number: [id number] Patient Account Number: [id number] Date of Birth/Sex: Treating RN: [birth year] (87 y.o. [name], [name] Primary Care Provider: Leonides Cave Other Clinician: Referring Provider: Treating Provider/Extender: Earnest Conroy Weeks in Treatment: 8 Verbal / Phone Orders: No Diagnosis Coding Follow-up Appointments Return Appointment in 1 week. Bathing/ Shower/ Hygiene May shower and wash wound with soap and water. - prior to dressing change Edema Control - Lymphedema / SCD / Other Elevate legs to the level of the heart or above for 30 minutes daily and/or when sitting, a frequency of: Avoid standing for long periods of time. Off-Loading Heel suspension boot to: - Gloped heel offloading sandal to right foot Turn and reposition every 2 hours Other: - float heels off of bed and chair by placing pillow under calves [place] wound care orders this week; continue [place] for wound care. May utilize formulary equivalent dressing for wound treatment orders unless otherwise specified. Other Home Health Orders/Instructions: - [facility] for wound care 2-3x a week Wound Treatment Wound #1 - Calcaneus Wound Laterality: Right Cleanser: Wound Cleanser (Home Health) Every Other Day/15 Days Discharge Instructions: Cleanse the wound with wound cleanser or normal saline prior to applying a clean dressing using gauze sponges, not tissue or cotton balls. Prim Dressing: Promogran Prisma Matrix, 4.34 (sq in) (silver collagen) (Home Health) Every Other Day/15 Days ary Discharge Instructions: Moisten collagen with saline or hydrogel Prim Dressing: Every Other Day/15 Days ary Secondary Dressing: Woven Gauze Sponge, Non-Sterile 4x4 in [facility]) (Generic) Every Other Day/15 Days Discharge Instructions: Apply over primary dressing as directed. Secondary Dressing: ALLEVYN Heel 4 1/2in x 5 1/2in / 10.5cm x 13.5cm (Home Health) (Generic) Every Other  Day/15 Days Discharge Instructions: Apply over primary dressing as directed. Secured With: [employer], 4.5x3.1 (in/yd) (Home Health) (Generic) Every Other Day/15 Days Discharge Instructions: Secure with Kerlix as directed. Secured With: Paper Tape, 2x10 (in/yd) (Home Health) (Generic) Every Other Day/15 Days Discharge Instructions: Secure dressing with tape as directed. Patient Medications llergies: ferrous sulfate, Actos, aspirin, pravastatin, gabapentin, Bactrim, ACE Inhibitors, Lyrica, Sulfa (Sulfonamide Antibiotics) A Notifications Medication  Indication Start End wound infection 02/06/2021 Augmentin DOSE oral 875 mg-125 mg tablet - 1 tablet oral bid for an additional week Electronic Signature(s) Signed: 02/06/2021 1:24:23 PM By: Linton Ham MD Entered By: Linton Ham on 02/06/2021 13:24:23 -------------------------------------------------------------------------------- Problem List Details Patient Name: Date of Service: [name] 02/06/2021 12:30 PM Medical Record Number: [id number] Patient Account Number: [id number] Date of Birth/Sex: Treating RN: 11-03-1928 (87 y.o. [name], [name] Primary Care Provider: Leonides Cave Other Clinician: Referring Provider: Treating Provider/Extender: Lina Sayre, Purcell Mouton Weeks in Treatment: 8 Active Problems ICD-10 Encounter Code Description Active Date MDM Diagnosis E11.621 Type 2 diabetes mellitus with foot ulcer 12/11/2020 No Yes L97.418 Non-pressure chronic ulcer of right heel and midfoot with other specified 12/11/2020 No Yes severity E11.51 Type 2 diabetes mellitus with diabetic peripheral angiopathy without gangrene 12/11/2020 No Yes L03.115 Cellulitis of right lower limb 02/06/2021 No Yes E11.42 Type 2 diabetes mellitus with diabetic polyneuropathy 12/11/2020 No Yes Inactive Problems Resolved Problems Electronic Signature(s) Signed: 02/08/2021 7:47:56 AM By: Linton Ham  MD Entered By: Linton Ham on 02/06/2021 13:19:30 -------------------------------------------------------------------------------- Progress Note Details Patient Name: Date of Service: [name] 02/06/2021 12:30 PM Medical Record Number: [id number] Patient Account Number: [id number] Date of Birth/Sex: Treating RN: Mar 27, 1928 (87 y.o. [name], [place] Primary Care Provider: Leonides Cave Other Clinician: Referring Provider: Treating Provider/Extender: Lina Sayre, Purcell Mouton Weeks in Treatment: 8 Subjective History of Present Illness (HPI) ADMISSION 12/11/2020 This is a 87 year old man who apparently fractured his left hip in June and then had a subsequent fall and femur fracture requiring a separate surgery. He spent a long time at rehab at [place] skilled facility. Sometime in October his wife noted a blister on the left heel that is gradually morphed into a pressure ulcer. He saw his primary physician on 11/07/2020 and he was referred here. The patient had prior problems with PAD on the left. His last arterial studies in September 2020 showed an ABI on the right of 1.03 with a TBI of 0.73 and biphasic waveforms on the left a ABI of 0.84, TBI's were not done because the toe was surgically absent. He had monophasic and biphasic waveforms. In 2017 he had an angiogram showing a mom diffusely diseased SFA and occluded left popliteal he underwent a left popliteal and left peroneal artery angioplasty. Noteworthy on the LEFT the peroneal was the only patent vessel below the knee. I do not think his right side was actually studied Past medical history; type 2 diabetes on insulin with peripheral neuropathy, hypertension, hyperlipidemia, hip fracture and subsequent femur fracture is noted, and emphysema, history of bladder cancer requiring in and out catheterizations, coronary artery disease, BPH. Updated ABI in our clinic was 0.93 on the right 2/14 Santyl to  continue. Mechanical debridement 2/21; using Santyl. Still requiring mechanical debridement today which was disappointing. 3/15; 3-week follow-up. Using Santyl but still requiring mechanical debridements in the same spot in this wound. Most of the rest of the surface of this wound looks satisfactory however in the most proximal part filled with mostly necrotic tissue. 3/29; 2-week follow-up. They have been using Santyl the patient's wife is changing the dressing as well as home health. Culture I did showed both Enterococcus and Klebsiella he was treated with 7 days of Augmentin but I am going to extend this. X-ray was negative for osteomyelitis Objective Constitutional Sitting or standing Blood Pressure is within target range for patient.. Pulse regular and within target range  for patient.Marland Kitchen Respirations regular, non-labored and within target range.. Temperature is normal and within the target range for the patient.Marland Kitchen Appears in no distress. Vitals Time Taken: 12:45 PM, Height: 74 in, Weight: 205 lbs, BMI: 26.3, Temperature: 98.3 F, Pulse: 76 bpm, Respiratory Rate: 18 breaths/min, Blood Pressure: 136/72 mmHg, Capillary Blood Glucose: 93 mg/dl. General Notes: glucose per pt report General Notes: Wound exam; tip of his Achilles. Nonviable surface is better however this goes straight downwards a lot more depth than I am used to seeing 2 weeks ago. I used a #5 curette to remove debris on the remaining granulated surface and also some remaining debris in the deep proximal part. Integumentary (Hair, Skin) Wound #1 status is Open. Original cause of wound was Pressure Injury. The date acquired was: 08/11/2020. The wound has been in treatment 8 weeks. The wound is located on the Right Calcaneus. The wound measures 1.5cm length x 3cm width x 1cm depth; 3.534cm^2 area and 3.534cm^3 volume. There is Fat Layer (Subcutaneous Tissue) exposed. There is no tunneling noted, however, there is undermining starting at  9:00 and ending at 1:00 with a maximum distance of 0.6cm. There is a medium amount of serosanguineous drainage noted. The wound margin is epibole. There is medium (34-66%) red, pink granulation within the wound bed. There is a medium (34-66%) amount of necrotic tissue within the wound bed including Adherent Slough. 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 There was a Excisional Skin/Subcutaneous Tissue Debridement with a total area of 4.5 sq cm performed by Ricard Dillon., MD. With the following instrument(s): Curette to remove Viable and Non-Viable tissue/material. Material removed includes Subcutaneous Tissue, Slough, Skin: Dermis, and Skin: Epidermis after achieving pain control using Lidocaine. No specimens were taken. A time out was conducted at 13:07, prior to the start of the procedure. A Minimum amount of bleeding was controlled with Pressure. The procedure was tolerated well with a pain level of 0 throughout and a pain level of 0 following the procedure. Post Debridement Measurements: 1.5cm length x 3cm width x 1cm depth; 3.534cm^3 volume. Character of Wound/Ulcer Post Debridement is improved. Severity of Tissue Post Debridement is: Fat layer exposed. Post procedure Diagnosis Wound #1: Same as Pre-Procedure Plan Follow-up Appointments: Return Appointment in 1 week. Bathing/ Shower/ Hygiene: May shower and wash wound with soap and water. - prior to dressing change Edema Control - Lymphedema / SCD /  Other: Elevate legs to the level of the heart or above for 30 minutes daily and/or when sitting, a frequency of: Avoid standing for long periods of time. Off-Loading: Heel suspension boot to: - Gloped heel offloading sandal to right foot Turn and reposition every 2 hours Other: - float heels off of bed and chair by placing pillow under calves Home Health: New wound care orders this week; continue Home Health for wound care. May utilize formulary equivalent dressing for wound treatment orders unless otherwise specified. Other Home Health Orders/Instructions: - [facility] for wound care 2-3x a week WOUND #1: - Calcaneus Wound Laterality: Right Cleanser: Wound Cleanser (Home Health) Every Other Day/15 Days Discharge Instructions: Cleanse the wound  with wound cleanser or normal saline prior to applying a clean dressing using gauze sponges, not tissue or cotton balls. Prim Dressing: Promogran Prisma Matrix, 4.34 (sq in) (silver collagen) (Home Health) Every Other Day/15 Days ary Discharge Instructions: Moisten collagen with saline or hydrogel Prim Dressing: Every Other Day/15 Days ary Secondary Dressing: Woven Gauze Sponge, Non-Sterile 4x4 in [facility]) (Generic) Every Other Day/15 Days Discharge Instructions: Apply over primary dressing as directed. Secondary Dressing: ALLEVYN Heel 4 1/2in x 5 1/2in / 10.5cm x 13.5cm (Home Health) (Generic) Every Other Day/15 Days Discharge Instructions: Apply over primary dressing as directed. Secured With: [employer], 4.5x3.1 (in/yd) (Home Health) (Generic) Every Other Day/15 Days Discharge Instructions: Secure with Kerlix as directed. Secured With: Paper T ape, 2x10 (in/yd) (Home Health) (Generic) Every Other Day/15 Days Discharge Instructions: Secure dressing with tape as directed. 1. Worrisome presentation of the wound today with a large part of the open area going down towards the calcaneus itself 2. I am going to change the dressing to  silver collagen to see if I can stimulate some granulation 3. Extending his Augmentin additional week. 4. Fortunately his x-ray was negative although possibly an MRI is going to be necessary here. Electronic Signature(s) Signed: 02/08/2021 7:47:56 AM By: Linton Ham MD Entered By: Linton Ham on 02/06/2021 13:22:57 -------------------------------------------------------------------------------- SuperBill Details Patient Name: Date of Service: [name] 02/06/2021 Medical Record Number: [id number] Patient Account Number: [id number] Date of Birth/Sex: Treating RN: October 24, 1928 (87 y.o. [name], [name] Primary Care Provider: Leonides Cave Other Clinician: Referring Provider: Treating Provider/Extender: Lina Sayre, Purcell Mouton Weeks in Treatment: 8 Diagnosis Coding ICD-10 Codes Code Description [phone] Type 2 diabetes mellitus with foot ulcer L97.418 Non-pressure chronic ulcer of right heel and midfoot with other specified severity E11.51 Type 2 diabetes mellitus with diabetic peripheral angiopathy without gangrene E11.42 Type 2 diabetes mellitus with diabetic polyneuropathy Facility Procedures CPT4 Code: [id number] Description: 35009 - DEB SUBQ TISSUE 20 SQ CM/< ICD-10 Diagnosis Description L97.418 Non-pressure chronic ulcer of right heel and midfoot with other specified sev Modifier: erity Quantity: 1 Physician Procedures : CPT4 Code Description Modifier [id number] - WC PHYS SUBQ TISS 20 SQ CM ICD-10 Diagnosis Description L97.418 Non-pressure chronic ulcer of right heel and midfoot with other specified severity Quantity: 1 Electronic Signature(s) Signed: 02/08/2021 7:47:56 AM By: Linton Ham MD Entered By: Linton Ham on 02/06/2021 13:24:36

## 2021-02-09 DIAGNOSIS — Z96649 Presence of unspecified artificial hip joint: Secondary | ICD-10-CM | POA: Diagnosis not present

## 2021-02-09 DIAGNOSIS — Z9181 History of falling: Secondary | ICD-10-CM | POA: Diagnosis not present

## 2021-02-09 DIAGNOSIS — Z794 Long term (current) use of insulin: Secondary | ICD-10-CM | POA: Diagnosis not present

## 2021-02-09 DIAGNOSIS — Z8551 Personal history of malignant neoplasm of bladder: Secondary | ICD-10-CM | POA: Diagnosis not present

## 2021-02-09 DIAGNOSIS — J439 Emphysema, unspecified: Secondary | ICD-10-CM | POA: Diagnosis not present

## 2021-02-09 DIAGNOSIS — E785 Hyperlipidemia, unspecified: Secondary | ICD-10-CM | POA: Diagnosis not present

## 2021-02-09 DIAGNOSIS — E11621 Type 2 diabetes mellitus with foot ulcer: Secondary | ICD-10-CM | POA: Diagnosis not present

## 2021-02-09 DIAGNOSIS — E119 Type 2 diabetes mellitus without complications: Secondary | ICD-10-CM | POA: Diagnosis not present

## 2021-02-09 DIAGNOSIS — E1142 Type 2 diabetes mellitus with diabetic polyneuropathy: Secondary | ICD-10-CM | POA: Diagnosis not present

## 2021-02-09 DIAGNOSIS — E1151 Type 2 diabetes mellitus with diabetic peripheral angiopathy without gangrene: Secondary | ICD-10-CM | POA: Diagnosis not present

## 2021-02-09 DIAGNOSIS — I509 Heart failure, unspecified: Secondary | ICD-10-CM | POA: Diagnosis not present

## 2021-02-09 DIAGNOSIS — L97412 Non-pressure chronic ulcer of right heel and midfoot with fat layer exposed: Secondary | ICD-10-CM | POA: Diagnosis not present

## 2021-02-09 DIAGNOSIS — I110 Hypertensive heart disease with heart failure: Secondary | ICD-10-CM | POA: Diagnosis not present

## 2021-02-09 DIAGNOSIS — M978XXA Periprosthetic fracture around other internal prosthetic joint, initial encounter: Secondary | ICD-10-CM | POA: Diagnosis not present

## 2021-02-13 DIAGNOSIS — I110 Hypertensive heart disease with heart failure: Secondary | ICD-10-CM | POA: Diagnosis not present

## 2021-02-13 DIAGNOSIS — E1142 Type 2 diabetes mellitus with diabetic polyneuropathy: Secondary | ICD-10-CM | POA: Diagnosis not present

## 2021-02-13 DIAGNOSIS — Z8551 Personal history of malignant neoplasm of bladder: Secondary | ICD-10-CM | POA: Diagnosis not present

## 2021-02-13 DIAGNOSIS — E785 Hyperlipidemia, unspecified: Secondary | ICD-10-CM | POA: Diagnosis not present

## 2021-02-13 DIAGNOSIS — J439 Emphysema, unspecified: Secondary | ICD-10-CM | POA: Diagnosis not present

## 2021-02-13 DIAGNOSIS — Z9181 History of falling: Secondary | ICD-10-CM | POA: Diagnosis not present

## 2021-02-13 DIAGNOSIS — E1151 Type 2 diabetes mellitus with diabetic peripheral angiopathy without gangrene: Secondary | ICD-10-CM | POA: Diagnosis not present

## 2021-02-13 DIAGNOSIS — E11621 Type 2 diabetes mellitus with foot ulcer: Secondary | ICD-10-CM | POA: Diagnosis not present

## 2021-02-13 DIAGNOSIS — I509 Heart failure, unspecified: Secondary | ICD-10-CM | POA: Diagnosis not present

## 2021-02-13 DIAGNOSIS — L97412 Non-pressure chronic ulcer of right heel and midfoot with fat layer exposed: Secondary | ICD-10-CM | POA: Diagnosis not present

## 2021-02-15 ENCOUNTER — Other Ambulatory Visit: Payer: Self-pay

## 2021-02-15 ENCOUNTER — Encounter (HOSPITAL_BASED_OUTPATIENT_CLINIC_OR_DEPARTMENT_OTHER): Payer: Medicare Other | Attending: Internal Medicine | Admitting: Internal Medicine

## 2021-02-15 ENCOUNTER — Other Ambulatory Visit (HOSPITAL_COMMUNITY): Payer: Self-pay | Admitting: Internal Medicine

## 2021-02-15 ENCOUNTER — Other Ambulatory Visit: Payer: Self-pay | Admitting: Internal Medicine

## 2021-02-15 DIAGNOSIS — E1151 Type 2 diabetes mellitus with diabetic peripheral angiopathy without gangrene: Secondary | ICD-10-CM | POA: Insufficient documentation

## 2021-02-15 DIAGNOSIS — L97412 Non-pressure chronic ulcer of right heel and midfoot with fat layer exposed: Secondary | ICD-10-CM | POA: Diagnosis not present

## 2021-02-15 DIAGNOSIS — I110 Hypertensive heart disease with heart failure: Secondary | ICD-10-CM | POA: Diagnosis not present

## 2021-02-15 DIAGNOSIS — E11621 Type 2 diabetes mellitus with foot ulcer: Secondary | ICD-10-CM | POA: Diagnosis not present

## 2021-02-15 DIAGNOSIS — E1140 Type 2 diabetes mellitus with diabetic neuropathy, unspecified: Secondary | ICD-10-CM | POA: Diagnosis not present

## 2021-02-15 DIAGNOSIS — I509 Heart failure, unspecified: Secondary | ICD-10-CM | POA: Insufficient documentation

## 2021-02-15 DIAGNOSIS — L97419 Non-pressure chronic ulcer of right heel and midfoot with unspecified severity: Secondary | ICD-10-CM

## 2021-02-15 DIAGNOSIS — E13621 Other specified diabetes mellitus with foot ulcer: Secondary | ICD-10-CM

## 2021-02-16 NOTE — Progress Notes (Signed)
[name] Small, [name] Small ([id number]) Visit Report for 02/15/2021 Debridement Details Patient Name: Date of Service: [name] Small, [name] Small [place] RIO N. 02/15/2021 12:30 PM Medical Record Number: [id number] Patient Account Number: [id number] Date of Birth/Sex: Treating RN: December 11, 1927 (87 y.o. [name] Small, [name] Small Primary Care Provider: Leonides Small Other Clinician: Referring Provider: Treating Provider/Extender: [name] Small in Treatment: 9 Debridement Performed for Assessment: Wound #1 Right Calcaneus Performed By: Physician [name] Small., MD Debridement Type: Debridement Severity of Tissue Pre Debridement: Fat layer exposed Level of Consciousness (Pre-procedure): Awake and Alert Pre-procedure Verification/Time Out Yes - 13:05 Taken: Start Time: 13:06 Pain Control: Lidocaine 4% T opical Solution T Area Debrided (L x W): otal 1.5 (cm) x 2.7 (cm) = 4.05 (cm) Tissue and other material debrided: Viable, Non-Viable, Slough, Subcutaneous, Skin: Dermis , Skin: Epidermis, Fibrin/Exudate, Slough Level: Skin/Subcutaneous Tissue Debridement Description: Excisional Instrument: Curette Bleeding: Minimum Hemostasis Achieved: Pressure End Time: 13:09 Procedural Pain: 0 Post Procedural Pain: 0 Response to Treatment: Procedure was tolerated well Level of Consciousness (Post- Awake and Alert procedure): Post Debridement Measurements of Total Wound Length: (cm) 1.5 Width: (cm) 2.7 Depth: (cm) 1 Volume: (cm) 3.181 Character of Wound/Ulcer Post Debridement: Requires Further Debridement Severity of Tissue Post Debridement: Fat layer exposed Post Procedure Diagnosis Same as Pre-procedure Electronic Signature(s) Signed: 02/15/2021 5:22:04 PM By: [name] Ham MD Signed: 02/16/2021 4:53:51 PM By: [name] Small Entered By: [name] Small on 02/15/2021 13:19:10 -------------------------------------------------------------------------------- HPI Details Patient Name: Date of  Service: [name] [name] 02/15/2021 12:30 PM Medical Record Number: [id number] Patient Account Number: [id number] Date of Birth/Sex: Treating RN: 08/31/1928 (87 y.o. [name] Small, [name] Small Primary Care Provider: Leonides Small Other Clinician: Referring Provider: Treating Provider/Extender: [name] Small in Treatment: 9 History of Present Illness HPI Description: ADMISSION 12/11/2020 This is a 87 year old man who apparently fractured his left hip in June and then had a subsequent fall and femur fracture requiring a separate surgery. He spent a long time at rehab at [facility] skilled facility. Sometime in October his wife noted a blister on the left heel that is gradually morphed into a pressure ulcer. He saw his primary physician on 11/07/2020 and he was referred here. The patient had prior problems with PAD on the left. His last arterial studies in September 2020 showed an ABI on the right of 1.03 with a TBI of 0.73 and biphasic waveforms on the left a ABI of 0.84, TBI's were not done because the toe was surgically absent. He had monophasic and biphasic waveforms. In 2017 he had an angiogram showing a mom diffusely diseased SFA and occluded left popliteal he underwent a left popliteal and left peroneal artery angioplasty. Noteworthy on the LEFT the peroneal was the only patent vessel below the knee. I do not think his right side was actually studied Past medical history; type 2 diabetes on insulin with peripheral neuropathy, hypertension, hyperlipidemia, hip fracture and subsequent femur fracture is noted, and emphysema, history of bladder cancer requiring in and out catheterizations, coronary artery disease, BPH. Updated ABI in our clinic was 0.93 on the right 2/14 Santyl to continue. Mechanical debridement 2/21; using Santyl. Still requiring mechanical debridement today which was disappointing. 3/15; 3-week follow-up. Using Santyl but still requiring mechanical  debridements in the same spot in this wound. Most of the rest of the surface of this wound looks satisfactory however in the most proximal part filled with mostly necrotic tissue. 3/29; 2-week follow-up. They have been using Santyl the patient's  wife is changing the dressing as well as home health. Culture I did showed both Enterococcus and Klebsiella he was treated with 7 days of Augmentin but I am going to extend this. X-ray was negative for osteomyelitis 4/7; weekly follow-up. I changed him to silver collagen last week no real improvement here. He has completed the Augmentin. He still has really 2 wounds part of it filled then but a deeper part that is of probing area right to the bone itself or at least close to it. A lot of necrotic debris continues in this area. We ran Dermagraft the last time he was here however it is being discontinued. Moreover in consideration of a skin substitute, we would need to have something that could fold and be molded into the wound itself Electronic Signature(s) Signed: 02/15/2021 5:22:04 PM By: [name] Ham MD Entered By: [name] Small on 02/15/2021 13:20:20 -------------------------------------------------------------------------------- Physical Exam Details Patient Name: Date of Service: [name] [name] 02/15/2021 12:30 PM Medical Record Number: [id number] Patient Account Number: [id number] Date of Birth/Sex: Treating RN: 1928-09-04 (87 y.o. [name] Small Primary Care Provider: Leonides Small Other Clinician: Referring Provider: Treating Provider/Extender: [name] Small in Treatment: 9 Constitutional Sitting or standing Blood Pressure is within target range for patient.. Pulse regular and within target range for patient.Marland Kitchen Respirations regular, non-labored and within target range.. Temperature is normal and within the target range for the patient.Marland Kitchen Appears in no distress. Cardiovascular Needle pulses are  palpable. Notes Wound exam; tip of the Achilles. Viable surface over the majority of the wound however there continues to be depth laterally. This goes close to bone. Once again with a #5 curette I am pulling out necrotic debris from this area. I am able to remove most of this. This is not grossly purulent but I am concerned about the underlying bone itself. No evidence of erythema or surrounding soft tissue infection. Electronic Signature(s) Signed: 02/15/2021 5:22:04 PM By: [name] Ham MD Entered By: [name] Small on 02/15/2021 13:21:36 -------------------------------------------------------------------------------- Physician Orders Details Patient Name: Date of Service: [name] [name] 02/15/2021 12:30 PM Medical Record Number: [id number] Patient Account Number: [id number] Date of Birth/Sex: Treating RN: [birth year] (87 y.o. [name] Small, [name] Small Primary Care Provider: Other Clinician: Leonides Small Referring Provider: Treating Provider/Extender: [name] Small in Treatment: 9 Verbal / Phone Orders: No Diagnosis Coding ICD-10 Coding Code Description E11.621 Type 2 diabetes mellitus with foot ulcer L97.418 Non-pressure chronic ulcer of right heel and midfoot with other specified severity E11.51 Type 2 diabetes mellitus with diabetic peripheral angiopathy without gangrene L03.115 Cellulitis of right lower limb E11.42 Type 2 diabetes mellitus with diabetic polyneuropathy Follow-up Appointments Return Appointment in 1 week. Cellular or Tissue Based Products Cellular or Tissue Based Product Type: - run insurance authorization to epifix. Bathing/ Shower/ Hygiene May shower and wash wound with soap and water. - prior to dressing change Edema Control - Lymphedema / SCD / Other Elevate legs to the level of the heart or above for 30 minutes daily and/or when sitting, a frequency of: Avoid standing for long periods of time. Off-Loading Heel suspension  boot to: - Gloped heel offloading sandal to right foot Turn and reposition every 2 hours Other: - float heels off of bed and chair by placing pillow under calves [place] wound care orders this week; continue [place] for wound care. May utilize formulary equivalent dressing for wound treatment orders unless otherwise specified. Other Home Health  Orders/Instructions: - [facility] for wound care 2-3x a week Wound Treatment Wound #1 - Calcaneus Wound Laterality: Right Cleanser: Wound Cleanser (Home Health) Every Other Day/15 Days Discharge Instructions: Cleanse the wound with wound cleanser or normal saline prior to applying a clean dressing using gauze sponges, not tissue or cotton balls. Prim Dressing: Promogran Prisma Matrix, 4.34 (sq in) (silver collagen) (Home Health) Every Other Day/15 Days ary Discharge Instructions: Moisten collagen with saline or hydrogel Secondary Dressing: Woven Gauze Sponge, Non-Sterile 4x4 in [facility]) (Generic) Every Other Day/15 Days Discharge Instructions: wet to dry packing the Prisma. Apply over primary dressing as directed. Secondary Dressing: ALLEVYN Heel 4 1/2in x 5 1/2in / 10.5cm x 13.5cm (Home Health) (Generic) Every Other Day/15 Days Discharge Instructions: Apply over primary dressing as directed. Secured With: [employer], 4.5x3.1 (in/yd) (Home Health) (Generic) Every Other Day/15 Days Discharge Instructions: Secure with Kerlix as directed. Secured With: Paper Tape, 2x10 (in/yd) (Home Health) (Generic) Every Other Day/15 Days Discharge Instructions: Secure dressing with tape as directed. Radiology Computed Tomography (CT) Scan , right heel without contrast - CT scan without contrast of right heel related to non healing diabetic foot ulcer. CPT code - (ICD10 E11.621 - Type 2 diabetes mellitus with foot ulcer) Electronic Signature(s) Signed: 02/15/2021 5:22:04 PM By: [name] Ham MD Signed: 02/16/2021 4:53:51 PM By: [name] Small Entered By: [name] Small on 02/15/2021 13:16:20 Prescription 02/15/2021 -------------------------------------------------------------------------------- Lockie Pares MD Patient Name: Provider: 1928/07/29 [id number] Date of Birth: NPI#: Jerilynn Mages [id number] Sex: DEA #: [phone] Phone #: License #: JAARS Patient Address: [address] [place], [place] [address] [place],  [address] [phone] Allergies ferrous sulfate; Actos; aspirin; pravastatin; gabapentin; Bactrim; ACE Inhibitors; Lyrica; Sulfa (Sulfonamide Antibiotics) Provider's Orders Computed Tomography (CT) Scan , right heel without contrast - ICD10: E11.621 - CT scan without contrast of right heel related to non healing diabetic foot ulcer. CPT code Hand Signature: Date(s): Electronic Signature(s) Signed: 02/15/2021 5:22:04 PM By: [name] Ham MD Signed: 02/16/2021 4:53:51 PM By: [name] Small Entered By: [name] Small on 02/15/2021 13:16:20 -------------------------------------------------------------------------------- Problem List Details Patient Name: Date of Service: [name] [name] 02/15/2021 12:30 PM Medical Record Number: [id number] Patient Account Number: [id number] Date of Birth/Sex: Treating RN: 1927/11/19 (87 y.o. [name] Small, [name] Small Primary Care Provider: Leonides Small Other Clinician: Referring Provider: Treating Provider/Extender: Lina Sayre, Purcell Mouton Small in Treatment: 9 Active Problems ICD-10 Encounter Code Description Active Date MDM Diagnosis E11.621 Type 2 diabetes mellitus with foot ulcer 12/11/2020 No Yes L97.418 Non-pressure chronic ulcer of right heel and midfoot with other specified 12/11/2020 No Yes severity E11.51 Type 2 diabetes mellitus with diabetic peripheral angiopathy without gangrene 12/11/2020 No Yes L03.115 Cellulitis of right lower limb 02/06/2021 No  Yes E11.42 Type 2 diabetes mellitus with diabetic polyneuropathy 12/11/2020 No Yes Inactive Problems Resolved Problems Electronic Signature(s) Signed: 02/15/2021 5:22:04 PM By: [name] Ham MD Entered By: [name] Small on 02/15/2021 13:18:29 -------------------------------------------------------------------------------- Progress Note Details Patient Name: Date of Service: [name] [name] 02/15/2021 12:30 PM Medical Record Number: [id number] Patient Account Number: [id number] Date of Birth/Sex: Treating RN: 08/05/1928 (87 y.o. [name] Small, [name] Small Primary Care Provider: Leonides Small Other Clinician: Referring Provider: Treating Provider/Extender: [name] Small in Treatment: 9 Subjective History of Present Illness (HPI) ADMISSION 12/11/2020 This is a 87 year old man who apparently fractured his left hip in June and then had a subsequent  fall and femur fracture requiring a separate surgery. He spent a long time at rehab at [facility] skilled facility. Sometime in October his wife noted a blister on the left heel that is gradually morphed into a pressure ulcer. He saw his primary physician on 11/07/2020 and he was referred here. The patient had prior problems with PAD on the left. His last arterial studies in September 2020 showed an ABI on the right of 1.03 with a TBI of 0.73 and biphasic waveforms on the left a ABI of 0.84, TBI's were not done because the toe was surgically absent. He had monophasic and biphasic waveforms. In 2017 he had an angiogram showing a mom diffusely diseased SFA and occluded left popliteal he underwent a left popliteal and left peroneal artery angioplasty. Noteworthy on the LEFT the peroneal was the only patent vessel below the knee. I do not think his right side was actually studied Past medical history; type 2 diabetes on insulin with peripheral neuropathy, hypertension, hyperlipidemia, hip fracture and subsequent femur fracture is  noted, and emphysema, history of bladder cancer requiring in and out catheterizations, coronary artery disease, BPH. Updated ABI in our clinic was 0.93 on the right 2/14 Santyl to continue. Mechanical debridement 2/21; using Santyl. Still requiring mechanical debridement today which was disappointing. 3/15; 3-week follow-up. Using Santyl but still requiring mechanical debridements in the same spot in this wound. Most of the rest of the surface of this wound looks satisfactory however in the most proximal part filled with mostly necrotic tissue. 3/29; 2-week follow-up. They have been using Santyl the patient's wife is changing the dressing as well as home health. Culture I did showed both Enterococcus and Klebsiella he was treated with 7 days of Augmentin but I am going to extend this. X-ray was negative for osteomyelitis 4/7; weekly follow-up. I changed him to silver collagen last week no real improvement here. He has completed the Augmentin. He still has really 2 wounds part of it filled then but a deeper part that is of probing area right to the bone itself or at least close to it. A lot of necrotic debris continues in this area. We ran Dermagraft the last time he was here however it is being discontinued. Moreover in consideration of a skin substitute, we would need to have something that could fold and be molded into the wound itself Objective Constitutional Sitting or standing Blood Pressure is within target range for patient.. Pulse regular and within target range for patient.Marland Kitchen Respirations regular, non-labored and within target range.. Temperature is normal and within the target range for the patient.Marland Kitchen Appears in no distress. Vitals Time Taken: 12:46 PM, Height: 74 in, Weight: 205 lbs, BMI: 26.3, Temperature: 98.4 F, Pulse: 71 bpm, Respiratory Rate: 18 breaths/min, Blood Pressure: 171/69 mmHg. Cardiovascular Needle pulses are palpable. General Notes: Wound exam; tip of the Achilles.  Viable surface over the majority of the wound however there continues to be depth laterally. This goes close to bone. Once again with a #5 curette I am pulling out necrotic debris from this area. I am able to remove most of this. This is not grossly purulent but I am concerned about the underlying bone itself. No evidence of erythema or surrounding soft tissue infection. Integumentary (Hair, Skin) Wound #1 status is Open. Original cause of wound was Pressure Injury. The date acquired was: 08/11/2020. The wound has been in treatment 9 Small. The wound is located on the Right Calcaneus. The wound measures 1.5cm length x 2.7cm width  x 1cm depth; 3.181cm^2 area and 3.181cm^3 volume. There is Fat Layer (Subcutaneous Tissue) exposed. There is no tunneling or undermining noted. There is a medium amount of serosanguineous drainage noted. The wound margin is epibole. There is large (67-100%) red, pink granulation within the wound bed. There is a small (1-33%) amount of necrotic tissue within the wound bed including Adherent Slough. 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 There was a Excisional Skin/Subcutaneous Tissue Debridement with a total area of 4.05 sq cm performed by [name] Small., MD. With the following instrument(s): Curette to remove Viable and Non-Viable tissue/material. Material removed includes Subcutaneous Tissue, Slough, Skin: Dermis, Skin: Epidermis, and Fibrin/Exudate after achieving pain control using Lidocaine 4%  Topical Solution. A time out was conducted at 13:05, prior to the start of the procedure. A Minimum amount of bleeding was controlled with Pressure. The procedure was tolerated well with a pain level of 0 throughout and a pain level of 0 following the procedure. Post Debridement Measurements: 1.5cm length x 2.7cm width x 1cm depth; 3.181cm^3 volume. Character of Wound/Ulcer Post Debridement requires further debridement. Severity of Tissue Post Debridement is: Fat layer exposed. Post procedure Diagnosis Wound #1: Same as Pre-Procedure Plan Follow-up Appointments: Return Appointment in 1 week. Cellular or Tissue Based Products: Cellular or Tissue Based Product Type: - run insurance authorization to epifix. Bathing/ Shower/ Hygiene: May shower and wash wound with soap and water. - prior to dressing change Edema Control - Lymphedema / SCD / Other: Elevate legs to the level of the heart or above for 30 minutes daily and/or when sitting, a frequency of: Avoid standing for long periods of time. Off-Loading: Heel suspension boot to: - Gloped heel offloading sandal to right foot Turn and reposition every 2 hours Other: - float heels off of bed and chair by placing pillow under calves Home Health: New wound care orders this week; continue Home Health for wound care. May utilize formulary equivalent dressing for wound treatment orders unless otherwise specified. Other Home Health Orders/Instructions: - [facility] for wound care 2-3x a week Radiology ordered were: Computed T omography (CT) Scan , right heel without contrast - CT scan without contrast of right heel related to non healing diabetic foot ulcer. CPT code WOUND #1: - Calcaneus Wound Laterality: Right Cleanser: Wound Cleanser (Home Health) Every Other Day/15 Days Discharge Instructions: Cleanse the wound with wound cleanser or normal saline prior to applying a clean dressing using gauze sponges, not tissue or cotton balls. Prim Dressing:  Promogran Prisma Matrix, 4.34 (sq in) (silver collagen) (Home Health) Every Other Day/15 Days ary Discharge Instructions: Moisten collagen with saline or hydrogel Secondary Dressing: Woven Gauze Sponge, Non-Sterile 4x4 in [facility]) (Generic) Every Other Day/15 Days Discharge Instructions: wet to dry packing the Prisma. Apply over primary dressing as directed. Secondary Dressing: ALLEVYN Heel 4 1/2in x 5 1/2in / 10.5cm x 13.5cm (Home Health) (Generic) Every Other Day/15 Days Discharge Instructions: Apply over primary dressing as directed. Secured With: [employer], 4.5x3.1 (in/yd) (Home Health) (Generic) Every Other Day/15 Days Discharge Instructions: Secure with Kerlix as directed. Secured With: Paper  T ape, 2x10 (in/yd) (Home Health) (Generic) Every Other Day/15 Days Discharge Instructions: Secure dressing with tape as directed. 1. For now I am continuing with the silver collagen 2. Imaging of the left calcaneus will be an unenhanced CT scan. He apparently has hardware in the femur from a year and a bit ago likely to preclude an MRI 3. If this has underlying osteomyelitis he will obviously need antibiotic therapy and probably infectious disease. It might be possible for me to get a bone scraping if that is the case. 4. If there is no osteomyelitis by CT scan he is going to need advanced treatment product we will run epifix Electronic Signature(s) Signed: 02/15/2021 5:22:04 PM By: [name] Ham MD Entered By: [name] Small on 02/15/2021 13:23:16 -------------------------------------------------------------------------------- SuperBill Details Patient Name: Date of Service: [name] [name] 02/15/2021 Medical Record Number: [id number] Patient Account Number: [id number] Date of Birth/Sex: Treating RN: 1928-04-25 (87 y.o. [name] Small, [name] Small Primary Care Provider: Leonides Small Other Clinician: Referring Provider: Treating Provider/Extender: [name] Small in Treatment: 9 Diagnosis Coding ICD-10 Codes Code Description E11.621 Type 2 diabetes mellitus with foot ulcer L97.418 Non-pressure chronic ulcer of right heel and midfoot with other specified severity E11.51 Type 2 diabetes mellitus with diabetic peripheral angiopathy without gangrene L03.115 Cellulitis of right lower limb E11.42 Type 2 diabetes mellitus with diabetic polyneuropathy Facility Procedures CPT4 Code: [id number] Description: 01779 - DEB SUBQ TISSUE 20 SQ CM/< ICD-10 Diagnosis Description L97.418 Non-pressure chronic ulcer of right heel and midfoot with other specified seve Modifier: rity Quantity: 1 Physician Procedures : CPT4 Code Description Modifier [id number] - WC PHYS SUBQ TISS 20 SQ CM ICD-10 Diagnosis Description L97.418 Non-pressure chronic ulcer of right heel and midfoot with other specified severity Quantity: 1 Electronic Signature(s) Signed: 02/15/2021 5:22:04 PM By: [name] Ham MD Entered By: [name] Small on 02/15/2021 13:23:26

## 2021-02-16 NOTE — Progress Notes (Signed)
[name] Small, [name] Small ([id number]) Visit Report for 02/15/2021 Arrival Information Details Patient Name: Date of Service: [name] Small, [name] Small Pringle RIO N. 02/15/2021 12:30 PM Medical Record Number: [id number] Patient Account Number: [id number] Date of Birth/Sex: Treating RN: [birth year] (87 y.o. [name] Primary Care Tanajah Boulter: Leonides Cave Other Clinician: Referring Jahel Wavra: Treating Fahad Cisse/Extender: Earnest Conroy Weeks in Treatment: 9 Visit Information History Since Last Visit Added or deleted any medications: No Patient Arrived: Wheel Chair Any new allergies or adverse reactions: No Arrival Time: 12:42 Had a fall or experienced change in No Accompanied By: wife activities of daily living that may affect Transfer Assistance: Manual risk of falls: Patient Identification Verified: Yes Signs or symptoms of abuse/neglect since No Secondary Verification Process Completed: Yes last visito Patient Requires Transmission-Based Precautions: No Hospitalized since last visit: No Patient Has Alerts: No Implantable device outside of the clinic No excluding cellular tissue based products placed in the center since last visit: Has Dressing in Place as Prescribed: Yes Has Footwear/Offloading in Place as Yes Prescribed: Right: Surgical Shoe with Pressure Relief Insole Pain Present Now: No Electronic Signature(s) Signed: 02/15/2021 5:35:17 PM By: Lorrin Jackson Entered By: Lorrin Jackson on 02/15/2021 12:46:34 -------------------------------------------------------------------------------- Encounter Discharge Information Details Patient Name: Date of Service: [name] [name] 02/15/2021 12:30 PM Medical Record Number: [id number] Patient Account Number: [id number] Date of Birth/Sex: Treating RN: 11/30/1927 (87 y.o. [name] Primary Care Shah Insley: Leonides Cave Other Clinician: Referring Stevana Dufner: Treating Tatiyana Foucher/Extender: Earnest Conroy Weeks in Treatment: 9 Encounter Discharge Information Items Post Procedure Vitals Discharge Condition: Stable Temperature (F): 98.4 Ambulatory Status: Wheelchair Pulse (bpm): 71 Discharge Destination: Home Respiratory Rate (breaths/min): 18 Transportation: Private Auto Blood Pressure (mmHg): 171/69 Accompanied By: spouse Schedule Follow-up Appointment: Yes Clinical Summary of Care: Patient Declined Electronic Signature(s) Signed: 02/15/2021 5:18:58 PM By: Baruch Gouty RN, BSN Entered By: Baruch Gouty on 02/15/2021 13:29:40 -------------------------------------------------------------------------------- Lower Extremity Assessment Details Patient Name: Date of Service: [name] [name] 02/15/2021 12:30 PM Medical Record Number: [id number] Patient Account Number: [id number] Date of Birth/Sex: Treating RN: April 12, 1928 (87 y.o. [name] Primary Care Britteney Ayotte: Leonides Cave Other Clinician: Referring Gerrianne Aydelott: Treating Marybeth Dandy/Extender: Earnest Conroy Weeks in Treatment: 9 Edema Assessment Assessed: Shirlyn Goltz: No] Patrice Paradise: Yes] Edema: [Left: N] [Right: o] Calf Left: Right: Point of Measurement: 33 cm From Medial Instep 33 cm Ankle Left: Right: Point of Measurement: 12 cm From Medial Instep 22 cm Vascular Assessment Pulses: Dorsalis Pedis Palpable: [Right:Yes] Electronic Signature(s) Signed: 02/15/2021 5:35:17 PM By: Lorrin Jackson Entered By: Lorrin Jackson on 02/15/2021 12:54:06 -------------------------------------------------------------------------------- Multi Wound Chart Details Patient Name: Date of Service: [name] [name] 02/15/2021 12:30 PM Medical Record Number: [id number] Patient Account Number: [id number] Date of Birth/Sex: Treating RN: 1928-10-21 (86 y.o. [name], [name] Primary Care Reine Bristow: Leonides Cave Other Clinician: Referring Ned Kakar: Treating Tyreese Thain/Extender: Earnest Conroy Weeks in Treatment: 9 Vital Signs Height(in): 74 Pulse(bpm): 36 Weight(lbs): 205 Blood Pressure(mmHg): 171/69 Body Mass Index(BMI): 26 Temperature(F): 98.4 Respiratory Rate(breaths/min): 18 Photos: [1:No Photos Right Calcaneus] [N/A:N/A N/A] Wound Location: [1:Pressure Injury] [N/A:N/A] Wounding Event: [1:Diabetic Wound/Ulcer of the Lower] [N/A:N/A] Primary Etiology: [1:Extremity Pressure Ulcer] [N/A:N/A] Secondary Etiology: [1:Chronic Obstructive Pulmonary] [N/A:N/A] Comorbid History: [1:Disease (COPD), Congestive Heart Failure, Coronary Artery Disease, Hypertension, Peripheral Arterial Disease, Type II Diabetes, Neuropathy 08/11/2020] [N/A:N/A] Date Acquired: [1:9] [N/A:N/A] Weeks of Treatment: [1:Open] [N/A:N/A] Wound Status: [1:1.5x2.7x1] [N/A:N/A] Measurements L x W x D (cm) [1:3.181] [  N/A:N/A] A (cm) : rea [1:3.181] [N/A:N/A] Volume (cm) : [1:56.20%] [N/A:N/A] % Reduction in A [1:rea: -338.20%] [N/A:N/A] % Reduction in Volume: [1:Grade 2] [N/A:N/A] Classification: [1:Medium] [N/A:N/A] Exudate A mount: [1:Serosanguineous] [N/A:N/A] Exudate Type: [1:red, brown] [N/A:N/A] Exudate Color: [1:Epibole] [N/A:N/A] Wound Margin: [1:Large (67-100%)] [N/A:N/A] Granulation A mount: [1:Red, Pink] [N/A:N/A] Granulation Quality: [1:Small (1-33%)] [N/A:N/A] Necrotic A mount: [1:Fat Layer (Subcutaneous Tissue): Yes N/A] Exposed Structures: [1:Fascia: No Tendon: No Muscle: No Joint: No Bone: No Small (1-33%)] [N/A:N/A] Epithelialization: [1:Debridement - Excisional] [N/A:N/A] Debridement: Pre-procedure Verification/Time Out 13:05 [N/A:N/A] Taken: [1:Lidocaine 4% Topical Solution] [N/A:N/A] Pain Control: [1:Subcutaneous, Slough] [N/A:N/A] Tissue Debrided: [1:Skin/Subcutaneous Tissue] [N/A:N/A] Level: [1:4.05] [N/A:N/A] Debridement A (sq cm): [1:rea Curette] [N/A:N/A] Instrument: [1:Minimum] [N/A:N/A] Bleeding: [1:Pressure] [N/A:N/A] Hemostasis A chieved: [1:0]  [N/A:N/A] Procedural Pain: [1:0] [N/A:N/A] Post Procedural Pain: [1:Procedure was tolerated well] [N/A:N/A] Debridement Treatment Response: [1:1.5x2.7x1] [N/A:N/A] Post Debridement Measurements L x W x D (cm) [1:3.181] [N/A:N/A] Post Debridement Volume: (cm) [1:Debridement] [N/A:N/A] Treatment Notes Electronic Signature(s) Signed: 02/15/2021 5:22:04 PM By: Linton Ham MD Signed: 02/16/2021 4:53:51 PM By: Deon Pilling Entered By: Linton Ham on 02/15/2021 13:18:37 -------------------------------------------------------------------------------- Multi-Disciplinary Care Plan Details Patient Name: Date of Service: [name] [name] 02/15/2021 12:30 PM Medical Record Number: [id number] Patient Account Number: [id number] Date of Birth/Sex: Treating RN: Jun 19, 1928 (87 y.o. [name], Meta.Reding Primary Care Natiya Seelinger: Leonides Cave Other Clinician: Referring Emalynn Clewis: Treating Deniese Oberry/Extender: Veneta Penton in Treatment: 9 Multidisciplinary Care Plan reviewed with physician Active Inactive Abuse / Safety / Falls / Self Care Management Nursing Diagnoses: History of Falls Impaired physical mobility Potential for injury related to falls Goals: Patient will not experience any injury related to falls Date Initiated: 12/11/2020 Target Resolution Date: 03/09/2021 Goal Status: Active Patient/caregiver will verbalize/demonstrate measures taken to prevent injury and/or falls Date Initiated: 12/11/2020 Date Inactivated: 02/06/2021 Target Resolution Date: 01/25/2021 Goal Status: Met Interventions: Assess Activities of Daily Living upon admission and as needed Assess fall risk on admission and as needed Assess: immobility, friction, shearing, incontinence upon admission and as needed Assess impairment of mobility on admission and as needed per policy Patient/Caregiver referred to community resources (specify in notes) Provide education on fall  prevention Provide education on personal and home safety Notes: Nutrition Nursing Diagnoses: Impaired glucose control: actual or potential Potential for alteratiion in Nutrition/Potential for imbalanced nutrition Goals: Patient/caregiver agrees to and verbalizes understanding of need to use nutritional supplements and/or vitamins as prescribed Date Initiated: 12/11/2020 Date Inactivated: 02/15/2021 Target Resolution Date: 03/09/2021 Goal Status: Met Patient/caregiver will maintain therapeutic glucose control Date Initiated: 12/11/2020 Target Resolution Date: 03/09/2021 Goal Status: Active Interventions: Assess HgA1c results as ordered upon admission and as needed Assess patient nutrition upon admission and as needed per policy Provide education on elevated blood sugars and impact on wound healing Provide education on nutrition Treatment Activities: Education provided on Nutrition : 12/11/2020 Notes: Pressure Nursing Diagnoses: Knowledge deficit related to causes and risk factors for pressure ulcer development Knowledge deficit related to management of pressures ulcers Potential for impaired tissue integrity related to pressure, friction, moisture, and shear Goals: Patient/caregiver will verbalize risk factors for pressure ulcer development Date Initiated: 12/11/2020 Target Resolution Date: 03/09/2021 Goal Status: Active Patient/caregiver will verbalize understanding of pressure ulcer management Date Initiated: 12/11/2020 Date Inactivated: 02/15/2021 Target Resolution Date: 02/25/2021 Goal Status: Met Interventions: Assess: immobility, friction, shearing, incontinence upon admission and as needed Assess offloading mechanisms upon admission and as needed Assess potential for pressure ulcer upon admission and as needed Provide  education on pressure ulcers Notes: Wound/Skin Impairment Nursing Diagnoses: Impaired tissue integrity Knowledge deficit related to ulceration/compromised skin  integrity Goals: Patient/caregiver will verbalize understanding of skin care regimen Date Initiated: 12/11/2020 Date Inactivated: 02/15/2021 Target Resolution Date: 02/25/2021 Goal Status: Met Ulcer/skin breakdown will have a volume reduction of 30% by week 4 Date Initiated: 12/11/2020 Target Resolution Date: 02/25/2021 Goal Status: Active Interventions: Assess patient/caregiver ability to obtain necessary supplies Assess patient/caregiver ability to perform ulcer/skin care regimen upon admission and as needed Assess ulceration(s) every visit Provide education on ulcer and skin care Notes: Electronic Signature(s) Signed: 02/16/2021 4:53:51 PM By: Deon Pilling Entered By: Deon Pilling on 02/15/2021 13:07:00 -------------------------------------------------------------------------------- Pain Assessment Details Patient Name: Date of Service: [name] Small, [name] RIO N. 02/15/2021 12:30 PM Medical Record Number: [id number] Patient Account Number: [id number] Date of Birth/Sex: Treating RN: 1928-03-24 (87 y.o. [name] Primary Care Glover Capano: Leonides Cave Other Clinician: Referring Bhavika Schnider: Treating Klaire Court/Extender: Earnest Conroy Weeks in Treatment: 9 Active Problems Location of Pain Severity and Description of Pain Patient Has Paino No Site Locations Pain Management and Medication Current Pain Management: Electronic Signature(s) Signed: 02/15/2021 5:35:17 PM By: Lorrin Jackson Entered By: Lorrin Jackson on 02/15/2021 12:52:36 -------------------------------------------------------------------------------- Patient/Caregiver Education Details Patient Name: Date of Service: [name] Small 4/7/2022andnbsp12:30 PM Medical Record Number: [id number] Patient Account Number: [id number] Date of Birth/Gender: Treating RN: Sep 24, 1928 (87 y.o. Hessie Diener Primary Care Physician: Leonides Cave Other Clinician: Referring Physician: Treating  Physician/Extender: Veneta Penton in Treatment: 9 Education Assessment Education Provided To: Patient and Caregiver wife Education Topics Provided Elevated Blood Sugar/ Impact on Healing: Handouts: Elevated Blood Sugars: How Do They Affect Wound Healing Methods: Explain/Verbal Responses: Reinforcements needed Electronic Signature(s) Signed: 02/16/2021 4:53:51 PM By: Deon Pilling Entered By: Deon Pilling on 02/15/2021 13:07:20 -------------------------------------------------------------------------------- Wound Assessment Details Patient Name: Date of Service: [name] Small, [name] SA RIO N. 02/15/2021 12:30 PM Medical Record Number: [id number] Patient Account Number: [id number] Date of Birth/Sex: Treating RN: 04-13-1928 (87 y.o. [name] Primary Care Akia Montalban: Leonides Cave Other Clinician: Referring Laurabeth Yip: Treating Jacey Eckerson/Extender: Earnest Conroy Weeks in Treatment: 9 Wound Status Wound Number: 1 Primary Diabetic Wound/Ulcer of the Lower Extremity Etiology: Wound Location: Right Calcaneus Secondary Pressure Ulcer Wounding Event: Pressure Injury Etiology: Date Acquired: 08/11/2020 Wound Open Weeks Of Treatment: 9 Status: Clustered Wound: No Comorbid Chronic Obstructive Pulmonary Disease (COPD), Congestive History: Heart Failure, Coronary Artery Disease, Hypertension, Peripheral Arterial Disease, Type II Diabetes, Neuropathy Photos Wound Measurements Length: (cm) 1.5 Width: (cm) 2.7 Depth: (cm) 1 Area: (cm) 3.181 Volume: (cm) 3.181 % Reduction in Area: 56.2% % Reduction in Volume: -338.2% Epithelialization: Small (1-33%) Tunneling: No Undermining: No Wound Description Classification: Grade 2 Wound Margin: Epibole Exudate Amount: Medium Exudate Type: Serosanguineous Exudate Color: red, brown Foul Odor After Cleansing: No Slough/Fibrino Yes Wound Bed Granulation Amount: Large (67-100%) Exposed  Structure Granulation Quality: Red, Pink Fascia Exposed: No Necrotic Amount: Small (1-33%) Fat Layer (Subcutaneous Tissue) Exposed: Yes Necrotic Quality: Adherent Slough Tendon Exposed: No Muscle Exposed: No Joint Exposed: No Bone Exposed: No Treatment Notes Wound #1 (Calcaneus) Wound Laterality: Right Cleanser Wound Cleanser Discharge Instruction: Cleanse the wound with wound cleanser or normal saline prior to applying a clean dressing using gauze sponges, not tissue or cotton balls. Peri-Wound Care Topical Primary Dressing Promogran Prisma Matrix, 4.34 (sq in) (silver collagen) Discharge Instruction: Moisten collagen with saline or hydrogel Secondary Dressing Woven Gauze Sponge, Non-Sterile 4x4 in Discharge Instruction:  wet to dry packing the Prisma. Apply over primary dressing as directed. ALLEVYN Heel 4 1/2in x 5 1/2in / 10.5cm x 13.5cm Discharge Instruction: Apply over primary dressing as directed. Secured With [employer], 4.5x3.1 (in/yd) Discharge Instruction: Secure with Kerlix as directed. Paper Tape, 2x10 (in/yd) Discharge Instruction: Secure dressing with tape as directed. Compression Wrap Compression Stockings Add-Ons Electronic Signature(s) Signed: 02/15/2021 4:58:23 PM By: Sandre Kitty Signed: 02/15/2021 5:35:17 PM By: Lorrin Jackson Entered By: Sandre Kitty on 02/15/2021 16:55:16 -------------------------------------------------------------------------------- Vitals Details Patient Name: Date of Service: [name] [name] 02/15/2021 12:30 PM Medical Record Number: [id number] Patient Account Number: [id number] Date of Birth/Sex: Treating RN: 11/01/1928 (87 y.o. [name] Primary Care Jailyn Leeson: Leonides Cave Other Clinician: Referring Antinette Keough: Treating Vernell Back/Extender: Earnest Conroy Weeks in Treatment: 9 Vital Signs Time Taken: 12:46 Temperature (F): 98.4 Height (in): 74 Pulse (bpm): 71 Weight  (lbs): 205 Respiratory Rate (breaths/min): 18 Body Mass Index (BMI): 26.3 Blood Pressure (mmHg): 171/69 Reference Range: 80 - 120 mg / dl Electronic Signature(s) Signed: 02/15/2021 5:35:17 PM By: Lorrin Jackson Entered By: Lorrin Jackson on 02/15/2021 12:52:29

## 2021-02-19 DIAGNOSIS — Z8551 Personal history of malignant neoplasm of bladder: Secondary | ICD-10-CM | POA: Diagnosis not present

## 2021-02-19 DIAGNOSIS — E1142 Type 2 diabetes mellitus with diabetic polyneuropathy: Secondary | ICD-10-CM | POA: Diagnosis not present

## 2021-02-19 DIAGNOSIS — L97412 Non-pressure chronic ulcer of right heel and midfoot with fat layer exposed: Secondary | ICD-10-CM | POA: Diagnosis not present

## 2021-02-19 DIAGNOSIS — E1151 Type 2 diabetes mellitus with diabetic peripheral angiopathy without gangrene: Secondary | ICD-10-CM | POA: Diagnosis not present

## 2021-02-19 DIAGNOSIS — Z9181 History of falling: Secondary | ICD-10-CM | POA: Diagnosis not present

## 2021-02-19 DIAGNOSIS — I110 Hypertensive heart disease with heart failure: Secondary | ICD-10-CM | POA: Diagnosis not present

## 2021-02-19 DIAGNOSIS — J439 Emphysema, unspecified: Secondary | ICD-10-CM | POA: Diagnosis not present

## 2021-02-19 DIAGNOSIS — E11621 Type 2 diabetes mellitus with foot ulcer: Secondary | ICD-10-CM | POA: Diagnosis not present

## 2021-02-19 DIAGNOSIS — I509 Heart failure, unspecified: Secondary | ICD-10-CM | POA: Diagnosis not present

## 2021-02-19 DIAGNOSIS — E785 Hyperlipidemia, unspecified: Secondary | ICD-10-CM | POA: Diagnosis not present

## 2021-02-20 ENCOUNTER — Ambulatory Visit (HOSPITAL_COMMUNITY)
Admission: RE | Admit: 2021-02-20 | Discharge: 2021-02-20 | Disposition: A | Discharge status: Home / Self Care | Payer: Medicare Other | Source: Ambulatory Visit | Attending: Internal Medicine | Admitting: Internal Medicine

## 2021-02-20 ENCOUNTER — Other Ambulatory Visit: Payer: Self-pay

## 2021-02-20 DIAGNOSIS — L97419 Non-pressure chronic ulcer of right heel and midfoot with unspecified severity: Secondary | ICD-10-CM | POA: Diagnosis not present

## 2021-02-20 DIAGNOSIS — E13621 Other specified diabetes mellitus with foot ulcer: Secondary | ICD-10-CM | POA: Diagnosis not present

## 2021-02-20 DIAGNOSIS — E11621 Type 2 diabetes mellitus with foot ulcer: Secondary | ICD-10-CM | POA: Diagnosis not present

## 2021-02-20 DIAGNOSIS — E11622 Type 2 diabetes mellitus with other skin ulcer: Secondary | ICD-10-CM | POA: Diagnosis not present

## 2021-02-20 DIAGNOSIS — R062 Wheezing: Secondary | ICD-10-CM | POA: Diagnosis not present

## 2021-02-20 DIAGNOSIS — J9600 Acute respiratory failure, unspecified whether with hypoxia or hypercapnia: Secondary | ICD-10-CM | POA: Diagnosis not present

## 2021-02-20 DIAGNOSIS — I509 Heart failure, unspecified: Secondary | ICD-10-CM | POA: Diagnosis not present

## 2021-02-20 DIAGNOSIS — L97509 Non-pressure chronic ulcer of other part of unspecified foot with unspecified severity: Secondary | ICD-10-CM | POA: Diagnosis not present

## 2021-02-20 DIAGNOSIS — J449 Chronic obstructive pulmonary disease, unspecified: Secondary | ICD-10-CM | POA: Diagnosis not present

## 2021-02-22 ENCOUNTER — Encounter (HOSPITAL_BASED_OUTPATIENT_CLINIC_OR_DEPARTMENT_OTHER): Payer: Medicare Other | Admitting: Internal Medicine

## 2021-02-22 ENCOUNTER — Other Ambulatory Visit: Payer: Self-pay

## 2021-02-22 DIAGNOSIS — L97418 Non-pressure chronic ulcer of right heel and midfoot with other specified severity: Secondary | ICD-10-CM | POA: Diagnosis not present

## 2021-02-22 DIAGNOSIS — E1151 Type 2 diabetes mellitus with diabetic peripheral angiopathy without gangrene: Secondary | ICD-10-CM | POA: Diagnosis not present

## 2021-02-22 DIAGNOSIS — E11621 Type 2 diabetes mellitus with foot ulcer: Secondary | ICD-10-CM | POA: Diagnosis not present

## 2021-02-22 DIAGNOSIS — I509 Heart failure, unspecified: Secondary | ICD-10-CM | POA: Diagnosis not present

## 2021-02-22 DIAGNOSIS — I110 Hypertensive heart disease with heart failure: Secondary | ICD-10-CM | POA: Diagnosis not present

## 2021-02-22 DIAGNOSIS — E1140 Type 2 diabetes mellitus with diabetic neuropathy, unspecified: Secondary | ICD-10-CM | POA: Diagnosis not present

## 2021-02-22 NOTE — Progress Notes (Signed)
GABERIEL, YOUNGBLOOD ([id number]) Visit Report for 02/22/2021 HPI Details Patient Name: Date of Service: JAHMANI, STAUP Lima RIO N. 02/22/2021 1:15 PM Medical Record Number: [id number] Patient Account Number: [id number] Date of Birth/Sex: Treating RN: 10/08/1928 (87 y.o. [name], [place] Primary Care Provider: Leonides Cave Other Clinician: Referring Provider: Treating Provider/Extender: Earnest Conroy Weeks in Treatment: 10 History of Present Illness HPI Description: ADMISSION 12/11/2020 This is a 87 year old man who apparently fractured his left hip in June and then had a subsequent fall and femur fracture requiring a separate surgery. He spent a long time at rehab at [facility] skilled facility. Sometime in October his wife noted a blister on the left heel that is gradually morphed into a pressure ulcer. He saw his primary physician on 11/07/2020 and he was referred here. The patient had prior problems with PAD on the left. His last arterial studies in September 2020 showed an ABI on the right of 1.03 with a TBI of 0.73 and biphasic waveforms on the left a ABI of 0.84, TBI's were not done because the toe was surgically absent. He had monophasic and biphasic waveforms. In 2017 he had an angiogram showing a mom diffusely diseased SFA and occluded left popliteal he underwent a left popliteal and left peroneal artery angioplasty. Noteworthy on the LEFT the peroneal was the only patent vessel below the knee. I do not think his right side was actually studied Past medical history; type 2 diabetes on insulin with peripheral neuropathy, hypertension, hyperlipidemia, hip fracture and subsequent femur fracture is noted, and emphysema, history of bladder cancer requiring in and out catheterizations, coronary artery disease, BPH. Updated ABI in our clinic was 0.93 on the right 2/14 Santyl to continue. Mechanical debridement 2/21; using Santyl. Still requiring mechanical debridement today  which was disappointing. 3/15; 3-week follow-up. Using Santyl but still requiring mechanical debridements in the same spot in this wound. Most of the rest of the surface of this wound looks satisfactory however in the most proximal part filled with mostly necrotic tissue. 3/29; 2-week follow-up. They have been using Santyl the patient's wife is changing the dressing as well as home health. Culture I did showed both Enterococcus and Klebsiella he was treated with 7 days of Augmentin but I am going to extend this. X-ray was negative for osteomyelitis 4/7; weekly follow-up. I changed him to silver collagen last week no real improvement here. He has completed the Augmentin. He still has really 2 wounds part of it filled then but a deeper part that is of probing area right to the bone itself or at least close to it. A lot of necrotic debris continues in this area. We ran Dermagraft the last time he was here however it is being discontinued. Moreover in consideration of a skin substitute, we would need to have something that could fold and be molded into the wound itself 4/14; weekly follow-up. Silver collagen. I am attempting to get epi fix to try and close down the superior divot. CT scan of this area did not show underlying osteomyelitis. In fact they mention that the wound does not go to bone. Certainly felt like it did clinically however. Unfortunately to get epi fix we are going to need a hemoglobin A1c. His wife states they just had lab work done at the [place] and we will see if we can get a copy of the hemoglobin A1c. The patient says it was 8.1 Electronic Signature(s) Signed: 02/22/2021 5:23:41 PM By: Linton Ham MD Entered  By: Linton Ham on 02/22/2021 14:15:47 -------------------------------------------------------------------------------- Physical Exam Details Patient Name: Date of Service: DEQUARIUS, JEFFRIES RIO N. 02/22/2021 1:15 PM Medical Record Number: [id number] Patient Account Number:  [id number] Date of Birth/Sex: Treating RN: [birth year] (87 y.o. [place] Noe Primary Care Provider: Leonides Cave Other Clinician: Referring Provider: Treating Provider/Extender: Earnest Conroy Weeks in Treatment: 10 Constitutional Patient is hypertensive.. Pulse regular and within target range for patient.Marland Kitchen Respirations regular, non-labored and within target range.. Temperature is normal and within the target range for the patient.Marland Kitchen Appears in no distress. Notes Wound exam; tip of the Achilles. Viable surface over the major part of the wound however there is continual depth anterior laterally. I do not feel any bone today or even necessarily close although this is deep there is no surrounding infection no purulence. No erythema Electronic Signature(s) Signed: 02/22/2021 5:23:41 PM By: Linton Ham MD Entered By: Linton Ham on 02/22/2021 14:16:29 -------------------------------------------------------------------------------- Physician Orders Details Patient Name: Date of Service: [name] 02/22/2021 1:15 PM Medical Record Number: [id number] Patient Account Number: [id number] Date of Birth/Sex: Treating RN: [birth year] (87 y.o. [name], [name] Primary Care Provider: Leonides Cave Other Clinician: Referring Provider: Treating Provider/Extender: Earnest Conroy Weeks in Treatment: 10 Verbal / Phone Orders: No Diagnosis Coding Follow-up Appointments Return Appointment in 2 weeks. Cellular or Tissue Based Products Cellular or Tissue Based Product Type: - running for epifix Other Cellular or Tissue Based Products Orders/Instructions: - Pt.'s wife to bring A1c in from [place] MD office on 04/15 Bathing/ Shower/ Hygiene May shower and wash wound with soap and water. - prior to dressing change Edema Control - Lymphedema / SCD / Other Elevate legs to the level of the heart or above for 30 minutes daily and/or when  sitting, a frequency of: Avoid standing for long periods of time. Off-Loading Heel suspension boot to: - Gloped heel offloading sandal to right foot Turn and reposition every 2 hours Other: - float heels off of bed and chair by placing pillow under calves [place] wound care orders this week; continue [place] for wound care. May utilize formulary equivalent dressing for wound treatment orders unless otherwise specified. Other Home Health Orders/Instructions: - [facility] for wound care 2-3x a week Wound Treatment Wound #1 - Calcaneus Wound Laterality: Right Cleanser: Wound Cleanser (Home Health) Every Other Day/15 Days Discharge Instructions: Cleanse the wound with wound cleanser or normal saline prior to applying a clean dressing using gauze sponges, not tissue or cotton balls. Prim Dressing: Promogran Prisma Matrix, 4.34 (sq in) (silver collagen) (Home Health) Every Other Day/15 Days ary Discharge Instructions: Moisten collagen with saline or hydrogel Secondary Dressing: Woven Gauze Sponge, Non-Sterile 4x4 in [facility]) (Generic) Every Other Day/15 Days Discharge Instructions: wet to dry packing the Prisma. Apply over primary dressing as directed. Secondary Dressing: ALLEVYN Heel 4 1/2in x 5 1/2in / 10.5cm x 13.5cm (Home Health) (Generic) Every Other Day/15 Days Discharge Instructions: Apply over primary dressing as directed. Secured With: [employer], 4.5x3.1 (in/yd) (Home Health) (Generic) Every Other Day/15 Days Discharge Instructions: Secure with Kerlix as directed. Secured With: Paper Tape, 2x10 (in/yd) (Home Health) (Generic) Every Other Day/15 Days Discharge Instructions: Secure dressing with tape as directed. Electronic Signature(s) Signed: 02/22/2021 5:23:41 PM By: Linton Ham MD Signed: 02/22/2021 5:45:48 PM By: Rhae Hammock RN Entered By: Rhae Hammock on 02/22/2021  14:00:32 -------------------------------------------------------------------------------- Problem List Details Patient Name: Date of Service: [name] 02/22/2021  1:15 PM Medical Record Number: [id number] Patient Account Number: [id number] Date of Birth/Sex: Treating RN: 09-Feb-1928 (87 y.o. [name], [name] Primary Care Provider: Leonides Cave Other Clinician: Referring Provider: Treating Provider/Extender: Lina Sayre, [place] Weeks in Treatment: 10 Active Problems ICD-10 Encounter Code Description Active Date MDM Diagnosis E11.621 Type 2 diabetes mellitus with foot ulcer 12/11/2020 No Yes L97.418 Non-pressure chronic ulcer of right heel and midfoot with other specified 12/11/2020 No Yes severity E11.51 Type 2 diabetes mellitus with diabetic peripheral angiopathy without gangrene 12/11/2020 No Yes L03.115 Cellulitis of right lower limb 02/06/2021 No Yes E11.42 Type 2 diabetes mellitus with diabetic polyneuropathy 12/11/2020 No Yes Inactive Problems Resolved Problems Electronic Signature(s) Signed: 02/22/2021 5:23:41 PM By: Linton Ham MD Entered By: Linton Ham on 02/22/2021 14:12:33 -------------------------------------------------------------------------------- Progress Note Details Patient Name: Date of Service: [name] 02/22/2021 1:15 PM Medical Record Number: [id number] Patient Account Number: [id number] Date of Birth/Sex: Treating RN: 1928/10/21 (87 y.o. [name], [place] Primary Care Provider: Leonides Cave Other Clinician: Referring Provider: Treating Provider/Extender: Earnest Conroy Weeks in Treatment: 10 Subjective History of Present Illness (HPI) ADMISSION 12/11/2020 This is a 87 year old man who apparently fractured his left hip in June and then had a subsequent fall and femur fracture requiring a separate surgery. He spent a long time at rehab at [facility] skilled facility.  Sometime in October his wife noted a blister on the left heel that is gradually morphed into a pressure ulcer. He saw his primary physician on 11/07/2020 and he was referred here. The patient had prior problems with PAD on the left. His last arterial studies in September 2020 showed an ABI on the right of 1.03 with a TBI of 0.73 and biphasic waveforms on the left a ABI of 0.84, TBI's were not done because the toe was surgically absent. He had monophasic and biphasic waveforms. In 2017 he had an angiogram showing a mom diffusely diseased SFA and occluded left popliteal he underwent a left popliteal and left peroneal artery angioplasty. Noteworthy on the LEFT the peroneal was the only patent vessel below the knee. I do not think his right side was actually studied Past medical history; type 2 diabetes on insulin with peripheral neuropathy, hypertension, hyperlipidemia, hip fracture and subsequent femur fracture is noted, and emphysema, history of bladder cancer requiring in and out catheterizations, coronary artery disease, BPH. Updated ABI in our clinic was 0.93 on the right 2/14 Santyl to continue. Mechanical debridement 2/21; using Santyl. Still requiring mechanical debridement today which was disappointing. 3/15; 3-week follow-up. Using Santyl but still requiring mechanical debridements in the same spot in this wound. Most of the rest of the surface of this wound looks satisfactory however in the most proximal part filled with mostly necrotic tissue. 3/29; 2-week follow-up. They have been using Santyl the patient's wife is changing the dressing as well as home health. Culture I did showed both Enterococcus and Klebsiella he was treated with 7 days of Augmentin but I am going to extend this. X-ray was negative for osteomyelitis 4/7; weekly follow-up. I changed him to silver collagen last week no real improvement here. He has completed the Augmentin. He still has really 2 wounds part of it filled  then but a deeper part that is of probing area right to the bone itself or at least close to it. A lot of necrotic debris continues in this area. We ran Dermagraft the last time he was here however it is being  discontinued. Moreover in consideration of a skin substitute, we would need to have something that could fold and be molded into the wound itself 4/14; weekly follow-up. Silver collagen. I am attempting to get epi fix to try and close down the superior divot. CT scan of this area did not show underlying osteomyelitis. In fact they mention that the wound does not go to bone. Certainly felt like it did clinically however. Unfortunately to get epi fix we are going to need a hemoglobin A1c. His wife states they just had lab work done at the [place] and we will see if we can get a copy of the hemoglobin A1c. The patient says it was 8.1 Objective Constitutional Patient is hypertensive.. Pulse regular and within target range for patient.Marland Kitchen Respirations regular, non-labored and within target range.. Temperature is normal and within the target range for the patient.Marland Kitchen Appears in no distress. Vitals Time Taken: 1:15 PM, Height: 74 in, Weight: 205 lbs, BMI: 26.3, Temperature: 98.1 F, Pulse: 72 bpm, Respiratory Rate: 18 breaths/min, Blood Pressure: 150/70 mmHg, Capillary Blood Glucose: 111 mg/dl. General Notes: Wound exam; tip of the Achilles. Viable surface over the major part of the wound however there is continual depth anterior laterally. I do not feel any bone today or even necessarily close although this is deep there is no surrounding infection no purulence. No erythema Integumentary (Hair, Skin) Wound #1 status is Open. Original cause of wound was Pressure Injury. The date acquired was: 08/11/2020. The wound has been in treatment 10 weeks. The wound is located on the Right Calcaneus. The wound measures 1.7cm length x 3cm width x 1cm depth; 4.006cm^2 area and 4.006cm^3 volume. There is Fat Layer  (Subcutaneous Tissue) exposed. There is no tunneling or undermining noted. There is a medium amount of serosanguineous drainage noted. The wound margin is epibole. There is large (67-100%) red, pink granulation within the wound bed. There is a small (1-33%) amount of necrotic tissue within the wound bed including Adherent Slough. Assessment Active Problems ICD-10 Type 2 diabetes mellitus with foot ulcer Non-pressure chronic ulcer of right heel and midfoot with other specified severity Type 2 diabetes mellitus with diabetic peripheral angiopathy without gangrene Cellulitis of right lower limb Type 2 diabetes mellitus with diabetic polyneuropathy Plan Follow-up Appointments: Return Appointment in 2 weeks. Cellular or Tissue Based Products: Cellular or Tissue Based Product Type: - running for epifix Other Cellular or Tissue Based Products Orders/Instructions: - Pt.'s wife to bring A1c in from [place] MD office on 04/15 Bathing/ Shower/ Hygiene: May shower and wash wound with soap and water. - prior to dressing change Edema Control - Lymphedema / SCD / Other: Elevate legs to the level of the heart or above for 30 minutes daily and/or when sitting, a frequency of: Avoid standing for long periods of time. Off-Loading: Heel suspension boot to: - Gloped heel offloading sandal to right foot Turn and reposition every 2 hours Other: - float heels off of bed and chair by placing pillow under calves Home Health: New wound care orders this week; continue Home Health for wound care. May utilize formulary equivalent dressing for wound treatment orders unless otherwise specified. Other Home Health Orders/Instructions: - [facility] for wound care 2-3x a week WOUND #1: - Calcaneus Wound Laterality: Right Cleanser: Wound Cleanser (Home Health) Every Other Day/15 Days Discharge Instructions: Cleanse the wound with wound cleanser or normal saline prior to applying a clean dressing using gauze sponges, not  tissue or cotton balls. Prim Dressing: Promogran Prisma Matrix, 4.34 (  sq in) (silver collagen) [facility]) Every Other Day/15 Days ary Discharge Instructions: Moisten collagen with saline or hydrogel Secondary Dressing: Woven Gauze Sponge, Non-Sterile 4x4 in [facility]) (Generic) Every Other Day/15 Days Discharge Instructions: wet to dry packing the Prisma. Apply over primary dressing as directed. Secondary Dressing: ALLEVYN Heel 4 1/2in x 5 1/2in / 10.5cm x 13.5cm (Home Health) (Generic) Every Other Day/15 Days Discharge Instructions: Apply over primary dressing as directed. Secured With: [employer], 4.5x3.1 (in/yd) (Home Health) (Generic) Every Other Day/15 Days Discharge Instructions: Secure with Kerlix as directed. Secured With: Paper T ape, 2x10 (in/yd) (Home Health) (Generic) Every Other Day/15 Days Discharge Instructions: Secure dressing with tape as directed. 1. The CT scan is reassuring 2. Continue silver collagen 3. Epi fix if we can get it through his insurance 4. Looking for the hemoglobin A1c that the insurance company requested. 4. The good news today is that this does not seem nearly as close to bone palpating with a Q-tip Electronic Signature(s) Signed: 02/22/2021 5:23:41 PM By: Linton Ham MD Entered By: Linton Ham on 02/22/2021 14:17:12 -------------------------------------------------------------------------------- SuperBill Details Patient Name: Date of Service: [name] 02/22/2021 Medical Record Number: [id number] Patient Account Number: [id number] Date of Birth/Sex: Treating RN: April 18, 1928 (87 y.o. [name], [name] Primary Care Provider: Leonides Cave Other Clinician: Referring Provider: Treating Provider/Extender: Earnest Conroy Weeks in Treatment: 10 Diagnosis Coding ICD-10 Codes Code Description [phone] Type 2 diabetes mellitus with foot ulcer L97.418 Non-pressure chronic ulcer of right heel  and midfoot with other specified severity E11.51 Type 2 diabetes mellitus with diabetic peripheral angiopathy without gangrene L03.115 Cellulitis of right lower limb E11.42 Type 2 diabetes mellitus with diabetic polyneuropathy Facility Procedures CPT4 Code: [id number] Description: 99213 - WOUND CARE VISIT-LEV 3 EST PT Modifier: Quantity: 1 Physician Procedures : CPT4 Code Description Modifier [id number] - WC PHYS LEVEL 3 - EST PT ICD-10 Diagnosis Description E11.621 Type 2 diabetes mellitus with foot ulcer L97.418 Non-pressure chronic ulcer of right heel and midfoot with other specified severity Quantity: 1 Electronic Signature(s) Signed: 02/22/2021 5:23:41 PM By: Linton Ham MD Entered By: Linton Ham on 02/22/2021 14:17:40

## 2021-02-24 ENCOUNTER — Other Ambulatory Visit: Payer: Self-pay | Admitting: Family Medicine

## 2021-02-24 DIAGNOSIS — N400 Enlarged prostate without lower urinary tract symptoms: Secondary | ICD-10-CM

## 2021-02-26 DIAGNOSIS — E11621 Type 2 diabetes mellitus with foot ulcer: Secondary | ICD-10-CM | POA: Diagnosis not present

## 2021-02-26 DIAGNOSIS — Z9181 History of falling: Secondary | ICD-10-CM | POA: Diagnosis not present

## 2021-02-26 DIAGNOSIS — J439 Emphysema, unspecified: Secondary | ICD-10-CM | POA: Diagnosis not present

## 2021-02-26 DIAGNOSIS — E785 Hyperlipidemia, unspecified: Secondary | ICD-10-CM | POA: Diagnosis not present

## 2021-02-26 DIAGNOSIS — I509 Heart failure, unspecified: Secondary | ICD-10-CM | POA: Diagnosis not present

## 2021-02-26 DIAGNOSIS — E1142 Type 2 diabetes mellitus with diabetic polyneuropathy: Secondary | ICD-10-CM | POA: Diagnosis not present

## 2021-02-26 DIAGNOSIS — I110 Hypertensive heart disease with heart failure: Secondary | ICD-10-CM | POA: Diagnosis not present

## 2021-02-26 DIAGNOSIS — Z8551 Personal history of malignant neoplasm of bladder: Secondary | ICD-10-CM | POA: Diagnosis not present

## 2021-02-26 DIAGNOSIS — E1151 Type 2 diabetes mellitus with diabetic peripheral angiopathy without gangrene: Secondary | ICD-10-CM | POA: Diagnosis not present

## 2021-02-26 DIAGNOSIS — L97412 Non-pressure chronic ulcer of right heel and midfoot with fat layer exposed: Secondary | ICD-10-CM | POA: Diagnosis not present

## 2021-02-28 DIAGNOSIS — E1142 Type 2 diabetes mellitus with diabetic polyneuropathy: Secondary | ICD-10-CM | POA: Diagnosis not present

## 2021-02-28 DIAGNOSIS — I509 Heart failure, unspecified: Secondary | ICD-10-CM | POA: Diagnosis not present

## 2021-02-28 DIAGNOSIS — E11621 Type 2 diabetes mellitus with foot ulcer: Secondary | ICD-10-CM | POA: Diagnosis not present

## 2021-02-28 DIAGNOSIS — J439 Emphysema, unspecified: Secondary | ICD-10-CM | POA: Diagnosis not present

## 2021-02-28 DIAGNOSIS — Z8551 Personal history of malignant neoplasm of bladder: Secondary | ICD-10-CM | POA: Diagnosis not present

## 2021-02-28 DIAGNOSIS — Z9181 History of falling: Secondary | ICD-10-CM | POA: Diagnosis not present

## 2021-02-28 DIAGNOSIS — E1151 Type 2 diabetes mellitus with diabetic peripheral angiopathy without gangrene: Secondary | ICD-10-CM | POA: Diagnosis not present

## 2021-02-28 DIAGNOSIS — L97412 Non-pressure chronic ulcer of right heel and midfoot with fat layer exposed: Secondary | ICD-10-CM | POA: Diagnosis not present

## 2021-02-28 DIAGNOSIS — I110 Hypertensive heart disease with heart failure: Secondary | ICD-10-CM | POA: Diagnosis not present

## 2021-02-28 DIAGNOSIS — E785 Hyperlipidemia, unspecified: Secondary | ICD-10-CM | POA: Diagnosis not present

## 2021-02-28 NOTE — Progress Notes (Signed)
[name] Small ([id number]) Visit Report for 02/22/2021 Arrival Information Details Patient Name: Date of Service: [name] Small, [name] Small [place] RIO N. 02/22/2021 1:15 PM Medical Record Number: [id number] Patient Account Number: [id number] Date of Birth/Sex: Treating RN: [birth year] (87 y.o. [name] Primary Care Ayano Douthitt: Leonides Cave Other Clinician: Referring Adriane Guglielmo: Treating Wyatte Dames/Extender: Earnest Conroy Weeks in Treatment: 10 Visit Information History Since Last Visit Added or deleted any medications: No Patient Arrived: Wheel Chair Any new allergies or adverse reactions: No Arrival Time: 13:14 Had a fall or experienced change in No Accompanied By: Wife activities of daily living that may affect Transfer Assistance: Manual risk of falls: Patient Identification Verified: Yes Signs or symptoms of abuse/neglect since last visito No Secondary Verification Process Completed: Yes Hospitalized since last visit: No Patient Requires Transmission-Based Precautions: No Implantable device outside of the clinic excluding No Patient Has Alerts: No cellular tissue based products placed in the center since last visit: Has Dressing in Place as Prescribed: Yes Has Footwear/Offloading in Place as Prescribed: Yes Right: Other:Offloading Shoe Pain Present Now: No Electronic Signature(s) Signed: 02/22/2021 6:07:55 PM By: Lorrin Jackson Entered By: Lorrin Jackson on 02/22/2021 13:15:32 -------------------------------------------------------------------------------- Clinic Level of Care Assessment Details Patient Name: Date of Service: [name] Small [name] 02/22/2021 1:15 PM Medical Record Number: [id number] Patient Account Number: [id number] Date of Birth/Sex: Treating RN: 1927-12-28 (87 y.o. [name], [place] Primary Care Gyneth Hubka: Leonides Cave Other Clinician: Referring Chapin Arduini: Treating Milli Woolridge/Extender: Earnest Conroy Weeks in  Treatment: 10 Clinic Level of Care Assessment Items TOOL 4 Quantity Score X- 1 0 Use when only an EandM is performed on FOLLOW-UP visit ASSESSMENTS - Nursing Assessment / Reassessment X- 1 10 Reassessment of Co-morbidities (includes updates in patient status) X- 1 5 Reassessment of Adherence to Treatment Plan ASSESSMENTS - Wound and Skin A ssessment / Reassessment X - Simple Wound Assessment / Reassessment - one wound 1 5 _0  - 0 Complex Wound Assessment / Reassessment - multiple wounds X- 1 10 Dermatologic / Skin Assessment (not related to wound area) ASSESSMENTS - Focused Assessment X- 1 5 Circumferential Edema Measurements - multi extremities X- 1 10 Nutritional Assessment / Counseling / Intervention _1  - 0 Lower Extremity Assessment (monofilament, tuning fork, pulses) _2  - 0 Peripheral Arterial Disease Assessment (using hand held doppler) ASSESSMENTS - Ostomy and/or Continence Assessment and Care _3  - 0 Incontinence Assessment and Management _4  - 0 Ostomy Care Assessment and Management (repouching, etc.) PROCESS - Coordination of Care X - Simple Patient / Family Education for ongoing care 1 15 _5  - 0 Complex (extensive) Patient / Family Education for ongoing care X- 1 10 Staff obtains Programmer, systems, Records, T Results / Process Orders est _6  - 0 Staff telephones HHA, Nursing Homes / Clarify orders / etc _7  - 0 Routine Transfer to another Facility (non-emergent condition) _8  - 0 Routine Hospital Admission (non-emergent condition) _9  - 0 New Admissions / Biomedical engineer / Ordering NPWT Apligraf, etc. , _10  - 0 Emergency Hospital Admission (emergent condition) X- 1 10 Simple Discharge Coordination _11  - 0 Complex (extensive) Discharge Coordination PROCESS - Special Needs _12  - 0 Pediatric / Minor Patient Management _13  - 0 Isolation Patient Management _14  - 0 Hearing / Language / Visual special needs _15  - 0 Assessment of Community assistance (transportation,  D/C planning, etc.) _16  - 0 Additional assistance / Altered mentation _17  - 0 Support Surface(s) Assessment (bed, cushion, seat, etc.) INTERVENTIONS - Wound Cleansing / Measurement X - Simple Wound  Cleansing - one wound 1 5 _0  - 0 Complex Wound Cleansing - multiple wounds X- 1 5 Wound Imaging (photographs - any number of wounds) _1  - 0 Wound Tracing (instead of photographs) X- 1 5 Simple Wound Measurement - one wound _2  - 0 Complex Wound Measurement - multiple wounds INTERVENTIONS - Wound Dressings X - Small Wound Dressing one or multiple wounds 1 10 _3  - 0 Medium Wound Dressing one or multiple wounds _4  - 0 Large Wound Dressing one or multiple wounds <URKYHCWCBJSEGBTD>_1<\/VOHYWVPXTGGYIRSW>_5  - 0 Application of Medications - topical <IOEVOJJKKXFGHWEX>_9<\/BZJIRCVELFYBOFBP>_1  - 0 Application of Medications - injection INTERVENTIONS - Miscellaneous _7  - 0 External ear exam _8  - 0 Specimen Collection (cultures, biopsies, blood, body fluids, etc.) _9  - 0 Specimen(s) / Culture(s) sent or taken to Lab for analysis _10  - 0 Patient Transfer (multiple staff / Civil Service fast streamer / Similar devices) _11  - 0 Simple Staple / Suture removal (25 or less) _12  - 0 Complex Staple / Suture removal (26 or more) _13  - 0 Hypo / Hyperglycemic Management (close monitor of Blood Glucose) _14  - 0 Ankle / Brachial Index (ABI) - do not check if billed separately X- 1 5 Vital Signs Has the patient been seen at the hospital within the last three years: Yes Total Score: 110 Level Of Care: New/Established - Level 3 Electronic Signature(s) Signed: 02/22/2021 5:45:48 PM By: Rhae Hammock RN Entered By: Rhae Hammock on 02/22/2021 14:06:34 -------------------------------------------------------------------------------- Encounter Discharge Information Details Patient Name: Date of Service: [name] 02/22/2021 1:15 PM Medical Record Number: [id number] Patient Account Number: [id number] Date of Birth/Sex: Treating RN: [birth year] (87 y.o. [name] Primary Care  Daichi Moris: Leonides Cave Other Clinician: Referring Markice Torbert: Treating Quantina Dershem/Extender: Earnest Conroy Weeks in Treatment: 10 Encounter Discharge Information Items Discharge Condition: Stable Ambulatory Status: Wheelchair Discharge Destination: Home Transportation: Private Auto Accompanied By: spouse Schedule Follow-up Appointment: Yes Clinical Summary of Care: Patient Declined Electronic Signature(s) Signed: 02/28/2021 6:20:01 PM By: Baruch Gouty RN, BSN Entered By: Baruch Gouty on 02/22/2021 14:11:38 -------------------------------------------------------------------------------- Lower Extremity Assessment Details Patient Name: Date of Service: [name] 02/22/2021 1:15 PM Medical Record Number: [id number] Patient Account Number: [id number] Date of Birth/Sex: Treating RN: 01/26/1928 (87 y.o. [name] Primary Care Yarianna Varble: Leonides Cave Other Clinician: Referring Angella Montas: Treating Rainna Nearhood/Extender: Earnest Conroy Weeks in Treatment: 10 Edema Assessment Assessed: Shirlyn Goltz: No] Patrice Paradise: Yes] Edema: [Left: N] [Right: o] Calf Left: Right: Point of Measurement: 33 cm From Medial Instep 33.5 cm Ankle Left: Right: Point of Measurement: 12 cm From Medial Instep 25 cm Vascular Assessment Pulses: Dorsalis Pedis Palpable: [Right:Yes] Electronic Signature(s) Signed: 02/22/2021 6:07:55 PM By: Lorrin Jackson Entered By: Lorrin Jackson on 02/22/2021 13:18:11 -------------------------------------------------------------------------------- Multi Wound Chart Details Patient Name: Date of Service: [name] 02/22/2021 1:15 PM Medical Record Number: [id number] Patient Account Number: [id number] Date of Birth/Sex: Treating RN: November 13, 1927 (87 y.o. [name], [name] Primary Care Bama Hanselman: Leonides Cave Other Clinician: Referring Rozalynn Buege: Treating Dorice Stiggers/Extender: Earnest Conroy Weeks in Treatment: 10 Vital Signs Height(in): 74 Capillary Blood Glucose(mg/dl): 111 Weight(lbs): 205 Pulse(bpm): 61 Body Mass Index(BMI): 26 Blood Pressure(mmHg): 150/70 Temperature(F): 98.1 Respiratory Rate(breaths/min): 18 Photos: [1:No Photos Right Calcaneus] [N/A:N/A N/A] Wound Location: [1:Pressure Injury] [N/A:N/A] Wounding Event: [1:Diabetic Wound/Ulcer of the Lower] [N/A:N/A] Primary Etiology: [1:Extremity Pressure Ulcer] [N/A:N/A] Secondary Etiology: [1:Chronic Obstructive Pulmonary] [N/A:N/A] Comorbid History: [1:Disease (COPD), Congestive Heart Failure, Coronary Artery Disease, Hypertension, Peripheral Arterial Disease, Type II  Diabetes, Neuropathy 08/11/2020] [N/A:N/A] Date Acquired: [1:10] [N/A:N/A] Weeks of Treatment: [1:Open] [N/A:N/A] Wound Status: [1:1.7x3x1] [N/A:N/A] Measurements L x W x D (cm) [1:4.006] [N/A:N/A] A (cm) : rea [1:4.006] [N/A:N/A] Volume (cm) : [1:44.80%] [N/A:N/A] % Reduction in A rea: [1:-451.80%] [N/A:N/A] % Reduction in Volume: [1:Grade 2] [N/A:N/A] Classification: [1:Medium] [N/A:N/A] Exudate A mount: [1:Serosanguineous] [N/A:N/A] Exudate Type: [1:red, brown] [N/A:N/A] Exudate Color: [1:Epibole] [N/A:N/A] Wound Margin: [1:Large (67-100%)] [N/A:N/A] Granulation A mount: [1:Red, Pink] [N/A:N/A] Granulation Quality: [1:Small (1-33%)] [N/A:N/A] Necrotic A mount: [1:Fat Layer (Subcutaneous Tissue): Yes N/A] Exposed Structures: [1:Fascia: No Tendon: No Muscle: No Joint: No Bone: No Small (1-33%)] [N/A:N/A] Treatment Notes Wound #1 (Calcaneus) Wound Laterality: Right Cleanser Wound Cleanser Discharge Instruction: Cleanse the wound with wound cleanser or normal saline prior to applying a clean dressing using gauze sponges, not tissue or cotton balls. Peri-Wound Care Topical Primary Dressing Promogran Prisma Matrix, 4.34 (sq in) (silver collagen) Discharge Instruction: Moisten collagen with saline or hydrogel Secondary  Dressing Woven Gauze Sponge, Non-Sterile 4x4 in Discharge Instruction: wet to dry packing the Prisma. Apply over primary dressing as directed. ALLEVYN Heel 4 1/2in x 5 1/2in / 10.5cm x 13.5cm Discharge Instruction: Apply over primary dressing as directed. Secured With [employer], 4.5x3.1 (in/yd) Discharge Instruction: Secure with Kerlix as directed. Paper Tape, 2x10 (in/yd) Discharge Instruction: Secure dressing with tape as directed. Compression Wrap Compression Stockings Add-Ons Electronic Signature(s) Signed: 02/22/2021 5:23:41 PM By: Linton Ham MD Signed: 02/22/2021 5:45:48 PM By: Rhae Hammock RN Entered By: Linton Ham on 02/22/2021 14:12:42 -------------------------------------------------------------------------------- Multi-Disciplinary Care Plan Details Patient Name: Date of Service: [name] 02/22/2021 1:15 PM Medical Record Number: [id number] Patient Account Number: [id number] Date of Birth/Sex: Treating RN: [birth year] (87 y.o. [name], [place] Primary Care Julez Huseby: Leonides Cave Other Clinician: Referring Shequilla Goodgame: Treating Hillary Schwegler/Extender: Veneta Penton in Treatment: 10 Multidisciplinary Care Plan reviewed with physician Active Inactive Abuse / Safety / Falls / Self Care Management Nursing Diagnoses: History of Falls Impaired physical mobility Potential for injury related to falls Goals: Patient will not experience any injury related to falls Date Initiated: 12/11/2020 Target Resolution Date: 03/09/2021 Goal Status: Active Patient/caregiver will verbalize/demonstrate measures taken to prevent injury and/or falls Date Initiated: 12/11/2020 Date Inactivated: 02/06/2021 Target Resolution Date: 01/25/2021 Goal Status: Met Interventions: Assess Activities of Daily Living upon admission and as needed Assess fall risk on admission and as needed Assess: immobility, friction, shearing,  incontinence upon admission and as needed Assess impairment of mobility on admission and as needed per policy Patient/Caregiver referred to community resources (specify in notes) Provide education on fall prevention Provide education on personal and home safety Notes: Nutrition Nursing Diagnoses: Impaired glucose control: actual or potential Potential for alteratiion in Nutrition/Potential for imbalanced nutrition Goals: Patient/caregiver agrees to and verbalizes understanding of need to use nutritional supplements and/or vitamins as prescribed Date Initiated: 12/11/2020 Date Inactivated: 02/15/2021 Target Resolution Date: 03/09/2021 Goal Status: Met Patient/caregiver will maintain therapeutic glucose control Date Initiated: 12/11/2020 Target Resolution Date: 03/09/2021 Goal Status: Active Interventions: Assess HgA1c results as ordered upon admission and as needed Assess patient nutrition upon admission and as needed per policy Provide education on elevated blood sugars and impact on wound healing Provide education on nutrition Treatment Activities: Education provided on Nutrition : 12/11/2020 Notes: Pressure Nursing Diagnoses: Knowledge deficit related to causes and risk factors for pressure ulcer development Knowledge deficit related to management of pressures ulcers Potential for impaired tissue integrity related to pressure, friction, moisture, and shear  Goals: Patient/caregiver will verbalize risk factors for pressure ulcer development Date Initiated: 12/11/2020 Target Resolution Date: 03/09/2021 Goal Status: Active Patient/caregiver will verbalize understanding of pressure ulcer management Date Initiated: 12/11/2020 Date Inactivated: 02/15/2021 Target Resolution Date: 02/25/2021 Goal Status: Met Interventions: Assess: immobility, friction, shearing, incontinence upon admission and as needed Assess offloading mechanisms upon admission and as needed Assess potential for pressure  ulcer upon admission and as needed Provide education on pressure ulcers Notes: Wound/Skin Impairment Nursing Diagnoses: Impaired tissue integrity Knowledge deficit related to ulceration/compromised skin integrity Goals: Patient/caregiver will verbalize understanding of skin care regimen Date Initiated: 12/11/2020 Date Inactivated: 02/15/2021 Target Resolution Date: 02/25/2021 Goal Status: Met Ulcer/skin breakdown will have a volume reduction of 30% by week 4 Date Initiated: 12/11/2020 Target Resolution Date: 02/25/2021 Goal Status: Active Interventions: Assess patient/caregiver ability to obtain necessary supplies Assess patient/caregiver ability to perform ulcer/skin care regimen upon admission and as needed Assess ulceration(s) every visit Provide education on ulcer and skin care Notes: Electronic Signature(s) Signed: 02/22/2021 5:45:48 PM By: Rhae Hammock RN Entered By: Rhae Hammock on 02/22/2021 14:00:39 -------------------------------------------------------------------------------- Pain Assessment Details Patient Name: Date of Service: [name] 02/22/2021 1:15 PM Medical Record Number: [id number] Patient Account Number: [id number] Date of Birth/Sex: Treating RN: Oct 12, 1928 (87 y.o. [name] Primary Care Elysa Womac: Leonides Cave Other Clinician: Referring Juanna Pudlo: Treating Marliss Buttacavoli/Extender: Earnest Conroy Weeks in Treatment: 10 Active Problems Location of Pain Severity and Description of Pain Patient Has Paino No Site Locations Pain Management and Medication Current Pain Management: Electronic Signature(s) Signed: 02/22/2021 6:07:55 PM By: Lorrin Jackson Entered By: Lorrin Jackson on 02/22/2021 13:17:03 -------------------------------------------------------------------------------- Patient/Caregiver Education Details Patient Name: Date of Service: Enid Baas 4/14/2022andnbsp1:15 PM Medical Record  Number: [id number] Patient Account Number: [id number] Date of Birth/Gender: Treating RN: 09-12-1928 (87 y.o. [name], [facility] Primary Care Physician: Leonides Cave Other Clinician: Referring Physician: Treating Physician/Extender: Veneta Penton in Treatment: 10 Education Assessment Education Provided To: Patient Education Topics Provided Wound/Skin Impairment: Methods: Explain/Verbal Responses: State content correctly Electronic Signature(s) Signed: 02/22/2021 5:45:48 PM By: Rhae Hammock RN Entered By: Rhae Hammock on 02/22/2021 14:05:10 -------------------------------------------------------------------------------- Wound Assessment Details Patient Name: Date of Service: [name] 02/22/2021 1:15 PM Medical Record Number: [id number] Patient Account Number: [id number] Date of Birth/Sex: Treating RN: 1927/11/28 (87 y.o. [name] Primary Care Arra Connaughton: Leonides Cave Other Clinician: Referring Nieshia Larmon: Treating Eloise Picone/Extender: Earnest Conroy Weeks in Treatment: 10 Wound Status Wound Number: 1 Primary Diabetic Wound/Ulcer of the Lower Extremity Etiology: Wound Location: Right Calcaneus Secondary Pressure Ulcer Wounding Event: Pressure Injury Etiology: Date Acquired: 08/11/2020 Wound Open Weeks Of Treatment: 10 Status: Clustered Wound: No Comorbid Chronic Obstructive Pulmonary Disease (COPD), Congestive History: Heart Failure, Coronary Artery Disease, Hypertension, Peripheral Arterial Disease, Type II Diabetes, Neuropathy Photos Wound Measurements Length: (cm) 1.7 Width: (cm) 3 Depth: (cm) 1 Area: (cm) 4.006 Volume: (cm) 4.006 % Reduction in Area: 44.8% % Reduction in Volume: -451.8% Epithelialization: Small (1-33%) Tunneling: No Undermining: No Wound Description Classification: Grade 2 Wound Margin: Epibole Exudate Amount: Medium Exudate Type:  Serosanguineous Exudate Color: red, brown Foul Odor After Cleansing: No Slough/Fibrino Yes Wound Bed Granulation Amount: Large (67-100%) Exposed Structure Granulation Quality: Red, Pink Fascia Exposed: No Necrotic Amount: Small (1-33%) Fat Layer (Subcutaneous Tissue) Exposed: Yes Necrotic Quality: Adherent Slough Tendon Exposed: No Muscle Exposed: No Joint Exposed: No Bone Exposed: No Treatment Notes Wound #1 (Calcaneus) Wound Laterality: Right Cleanser Wound  Cleanser Discharge Instruction: Cleanse the wound with wound cleanser or normal saline prior to applying a clean dressing using gauze sponges, not tissue or cotton balls. Peri-Wound Care Topical Primary Dressing Promogran Prisma Matrix, 4.34 (sq in) (silver collagen) Discharge Instruction: Moisten collagen with saline or hydrogel Secondary Dressing Woven Gauze Sponge, Non-Sterile 4x4 in Discharge Instruction: wet to dry packing the Prisma. Apply over primary dressing as directed. ALLEVYN Heel 4 1/2in x 5 1/2in / 10.5cm x 13.5cm Discharge Instruction: Apply over primary dressing as directed. Secured With [employer], 4.5x3.1 (in/yd) Discharge Instruction: Secure with Kerlix as directed. Paper Tape, 2x10 (in/yd) Discharge Instruction: Secure dressing with tape as directed. Compression Wrap Compression Stockings Add-Ons Electronic Signature(s) Signed: 02/22/2021 6:07:55 PM By: Lorrin Jackson Signed: 02/27/2021 8:07:32 AM By: Sandre Kitty Entered By: Sandre Kitty on 02/22/2021 16:54:38 -------------------------------------------------------------------------------- Vitals Details Patient Name: Date of Service: [name] 02/22/2021 1:15 PM Medical Record Number: [id number] Patient Account Number: [id number] Date of Birth/Sex: Treating RN: 03-Jun-1928 (87 y.o. [name] Primary Care Burney Calzadilla: Leonides Cave Other Clinician: Referring Lazarus Sudbury: Treating Jimya Ciani/Extender: Earnest Conroy Weeks in Treatment: 10 Vital Signs Time Taken: 13:15 Temperature (F): 98.1 Height (in): 74 Pulse (bpm): 72 Weight (lbs): 205 Respiratory Rate (breaths/min): 18 Body Mass Index (BMI): 26.3 Blood Pressure (mmHg): 150/70 Capillary Blood Glucose (mg/dl): 111 Reference Range: 80 - 120 mg / dl Electronic Signature(s) Signed: 02/22/2021 6:07:55 PM By: Lorrin Jackson Entered By: Lorrin Jackson on 02/22/2021 13:16:52

## 2021-03-02 DIAGNOSIS — E11621 Type 2 diabetes mellitus with foot ulcer: Secondary | ICD-10-CM | POA: Diagnosis not present

## 2021-03-02 DIAGNOSIS — L97412 Non-pressure chronic ulcer of right heel and midfoot with fat layer exposed: Secondary | ICD-10-CM | POA: Diagnosis not present

## 2021-03-02 DIAGNOSIS — E1142 Type 2 diabetes mellitus with diabetic polyneuropathy: Secondary | ICD-10-CM | POA: Diagnosis not present

## 2021-03-02 DIAGNOSIS — E785 Hyperlipidemia, unspecified: Secondary | ICD-10-CM | POA: Diagnosis not present

## 2021-03-02 DIAGNOSIS — Z9181 History of falling: Secondary | ICD-10-CM | POA: Diagnosis not present

## 2021-03-02 DIAGNOSIS — E1151 Type 2 diabetes mellitus with diabetic peripheral angiopathy without gangrene: Secondary | ICD-10-CM | POA: Diagnosis not present

## 2021-03-02 DIAGNOSIS — I509 Heart failure, unspecified: Secondary | ICD-10-CM | POA: Diagnosis not present

## 2021-03-02 DIAGNOSIS — J439 Emphysema, unspecified: Secondary | ICD-10-CM | POA: Diagnosis not present

## 2021-03-02 DIAGNOSIS — I110 Hypertensive heart disease with heart failure: Secondary | ICD-10-CM | POA: Diagnosis not present

## 2021-03-02 DIAGNOSIS — Z8551 Personal history of malignant neoplasm of bladder: Secondary | ICD-10-CM | POA: Diagnosis not present

## 2021-03-06 ENCOUNTER — Encounter: Payer: Self-pay | Admitting: Family Medicine

## 2021-03-06 DIAGNOSIS — Z8551 Personal history of malignant neoplasm of bladder: Secondary | ICD-10-CM | POA: Diagnosis not present

## 2021-03-06 DIAGNOSIS — L97412 Non-pressure chronic ulcer of right heel and midfoot with fat layer exposed: Secondary | ICD-10-CM | POA: Diagnosis not present

## 2021-03-06 DIAGNOSIS — I110 Hypertensive heart disease with heart failure: Secondary | ICD-10-CM | POA: Diagnosis not present

## 2021-03-06 DIAGNOSIS — E1151 Type 2 diabetes mellitus with diabetic peripheral angiopathy without gangrene: Secondary | ICD-10-CM | POA: Diagnosis not present

## 2021-03-06 DIAGNOSIS — I509 Heart failure, unspecified: Secondary | ICD-10-CM | POA: Diagnosis not present

## 2021-03-06 DIAGNOSIS — E1142 Type 2 diabetes mellitus with diabetic polyneuropathy: Secondary | ICD-10-CM | POA: Diagnosis not present

## 2021-03-06 DIAGNOSIS — E785 Hyperlipidemia, unspecified: Secondary | ICD-10-CM | POA: Diagnosis not present

## 2021-03-06 DIAGNOSIS — E11621 Type 2 diabetes mellitus with foot ulcer: Secondary | ICD-10-CM | POA: Diagnosis not present

## 2021-03-06 DIAGNOSIS — Z9181 History of falling: Secondary | ICD-10-CM | POA: Diagnosis not present

## 2021-03-06 DIAGNOSIS — J439 Emphysema, unspecified: Secondary | ICD-10-CM | POA: Diagnosis not present

## 2021-03-07 DIAGNOSIS — R339 Retention of urine, unspecified: Secondary | ICD-10-CM | POA: Diagnosis not present

## 2021-03-08 ENCOUNTER — Encounter (HOSPITAL_BASED_OUTPATIENT_CLINIC_OR_DEPARTMENT_OTHER): Payer: Medicare Other | Admitting: Internal Medicine

## 2021-03-08 ENCOUNTER — Other Ambulatory Visit: Payer: Self-pay

## 2021-03-08 DIAGNOSIS — E11621 Type 2 diabetes mellitus with foot ulcer: Secondary | ICD-10-CM | POA: Diagnosis not present

## 2021-03-08 DIAGNOSIS — I110 Hypertensive heart disease with heart failure: Secondary | ICD-10-CM | POA: Diagnosis not present

## 2021-03-08 DIAGNOSIS — E1140 Type 2 diabetes mellitus with diabetic neuropathy, unspecified: Secondary | ICD-10-CM | POA: Diagnosis not present

## 2021-03-08 DIAGNOSIS — E1151 Type 2 diabetes mellitus with diabetic peripheral angiopathy without gangrene: Secondary | ICD-10-CM | POA: Diagnosis not present

## 2021-03-08 DIAGNOSIS — I509 Heart failure, unspecified: Secondary | ICD-10-CM | POA: Diagnosis not present

## 2021-03-09 NOTE — Progress Notes (Signed)
MAREK, NGHIEM ([id number]) Visit Report for 03/08/2021 Arrival Information Details Patient Name: Date of Service: [name] Small, [name] Small [name] Small RIO N. 03/08/2021 12:30 PM Medical Record Number: [id number] Patient Account Number: [id number] Date of Birth/Sex: Treating RN: 1928-06-26 (87 y.o. [name] Small Primary Care Kymoni Monday: Leonides Cave Other Clinician: Referring Murry Diaz: Treating Nolita Kutter/Extender: Valda Lamb Weeks in Treatment: 12 Visit Information History Since Last Visit Added or deleted any medications: No Patient Arrived: Wheel Chair Any new allergies or adverse reactions: No Arrival Time: 12:33 Had a fall or experienced change in No Accompanied By: wife activities of daily living that may affect Transfer Assistance: None risk of falls: Patient Identification Verified: Yes Signs or symptoms of abuse/neglect since last visito No Secondary Verification Process Completed: Yes Hospitalized since last visit: No Patient Requires Transmission-Based Precautions: No Implantable device outside of the clinic excluding No Patient Has Alerts: No cellular tissue based products placed in the center since last visit: Has Dressing in Place as Prescribed: Yes Has Footwear/Offloading in Place as Prescribed: Yes Right: Wedge Shoe Pain Present Now: No Electronic Signature(s) Signed: 03/08/2021 5:13:18 PM By: Lorrin Jackson Entered By: Lorrin Jackson on 03/08/2021 12:38:49 -------------------------------------------------------------------------------- Encounter Discharge Information Details Patient Name: Date of Service: [name] [name] 03/08/2021 12:30 PM Medical Record Number: [id number] Patient Account Number: [id number] Date of Birth/Sex: Treating RN: October 06, 1928 (87 y.o. [name] Primary Care Konner Saiz: Leonides Cave Other Clinician: Referring Nygeria Lager: Treating Turrell Severt/Extender: Valda Lamb Weeks in Treatment:  12 Encounter Discharge Information Items Discharge Condition: Stable Ambulatory Status: Wheelchair Discharge Destination: Home Transportation: Private Auto Accompanied By: spouse Schedule Follow-up Appointment: Yes Clinical Summary of Care: Patient Declined Electronic Signature(s) Signed: 03/08/2021 5:26:54 PM By: Baruch Gouty RN, BSN Entered By: Baruch Gouty on 03/08/2021 13:54:40 -------------------------------------------------------------------------------- Lower Extremity Assessment Details Patient Name: Date of Service: [name] [name] 03/08/2021 12:30 PM Medical Record Number: [id number] Patient Account Number: [id number] Date of Birth/Sex: Treating RN: 1927/11/12 (87 y.o. [name] Small Primary Care Ryzen Deady: Leonides Cave Other Clinician: Referring Tayvin Preslar: Treating Troy Kanouse/Extender: Valda Lamb Weeks in Treatment: 12 Edema Assessment Assessed: Shirlyn Goltz: No] Patrice Paradise: Yes] Edema: [Left: N] [Right: o] Calf Left: Right: Point of Measurement: 33 cm From Medial Instep 32.5 cm Ankle Left: Right: Point of Measurement: 12 cm From Medial Instep 25 cm Vascular Assessment Pulses: Dorsalis Pedis Palpable: [Right:Yes] Electronic Signature(s) Signed: 03/08/2021 5:13:18 PM By: Lorrin Jackson Entered By: Lorrin Jackson on 03/08/2021 12:42:56 -------------------------------------------------------------------------------- Multi Wound Chart Details Patient Name: Date of Service: [name] [name] 03/08/2021 12:30 PM Medical Record Number: [id number] Patient Account Number: [id number] Date of Birth/Sex: Treating RN: 01-14-1928 (87 y.o. [name] Small, [name] Small Primary Care Niquita Digioia: Leonides Cave Other Clinician: Referring Jeramia Saleeby: Treating Tehya Leath/Extender: Valda Lamb Weeks in Treatment: 12 Vital Signs Height(in): 74 Capillary Blood Glucose(mg/dl): 180 Weight(lbs): 205 Pulse(bpm): 45 Body Mass  Index(BMI): 26 Blood Pressure(mmHg): 170/66 Temperature(F): 98 Respiratory Rate(breaths/min): 18 Photos: [1:No Photos Right Calcaneus] [N/A:N/A N/A] Wound Location: [1:Pressure Injury] [N/A:N/A] Wounding Event: [1:Diabetic Wound/Ulcer of the Lower] [N/A:N/A] Primary Etiology: [1:Extremity Pressure Ulcer] [N/A:N/A] Secondary Etiology: [1:Chronic Obstructive Pulmonary] [N/A:N/A] Comorbid History: [1:Disease (COPD), Congestive Heart Failure, Coronary Artery Disease, Hypertension, Peripheral Arterial Disease, Type II Diabetes, Neuropathy 08/11/2020] [N/A:N/A] Date Acquired: [1:12] [N/A:N/A] Weeks of Treatment: [1:Open] [N/A:N/A] Wound Status: [1:1.7x2.5x0.8] [N/A:N/A] Measurements L x W x D (cm) [1:3.338] [N/A:N/A] A (cm) : rea [1:2.67] [N/A:N/A] Volume (cm) : [1:54.00%] [N/A:N/A] % Reduction in A rea: [  1:-267.80%] [N/A:N/A] % Reduction in Volume: [1:Grade 2] [N/A:N/A] Classification: [1:Medium] [N/A:N/A] Exudate A mount: [1:Serosanguineous] [N/A:N/A] Exudate Type: [1:red, brown] [N/A:N/A] Exudate Color: [1:Epibole] [N/A:N/A] Wound Margin: [1:Large (67-100%)] [N/A:N/A] Granulation A mount: [1:Pink, Pale] [N/A:N/A] Granulation Quality: [1:Small (1-33%)] [N/A:N/A] Necrotic A mount: [1:Fat Layer (Subcutaneous Tissue): Yes N/A] Exposed Structures: [1:Fascia: No Tendon: No Muscle: No Joint: No Bone: No Medium (34-66%)] [N/A:N/A] Epithelialization: [1:Calloused Periwound] [N/A:N/A] Treatment Notes Electronic Signature(s) Signed: 03/08/2021 1:54:07 PM By: Kalman Shan DO Signed: 03/08/2021 6:31:57 PM By: Deon Pilling Entered By: Kalman Shan on 03/08/2021 13:45:06 -------------------------------------------------------------------------------- Multi-Disciplinary Care Plan Details Patient Name: Date of Service: [name] [name] 03/08/2021 12:30 PM Medical Record Number: [id number] Patient Account Number: [id number] Date of Birth/Sex: Treating RN: 05/08/1928 (87 y.o. [name] Small, Meta.Reding Primary Care Terrianne Cavness: Leonides Cave Other Clinician: Referring Cordae Mccarey: Treating Pete Merten/Extender: Rudolpho Sevin in Treatment: [place] reviewed with physician Active Inactive Abuse / Safety / Falls / Self Care Management Nursing Diagnoses: History of Falls Impaired physical mobility Potential for injury related to falls Goals: Patient will not experience any injury related to falls Date Initiated: 12/11/2020 Target Resolution Date: 04/06/2021 Goal Status: Active Patient/caregiver will verbalize/demonstrate measures taken to prevent injury and/or falls Date Initiated: 12/11/2020 Date Inactivated: 02/06/2021 Target Resolution Date: 01/25/2021 Goal Status: Met Interventions: Assess Activities of Daily Living upon admission and as needed Assess fall risk on admission and as needed Assess: immobility, friction, shearing, incontinence upon admission and as needed Assess impairment of mobility on admission and as needed per policy Patient/Caregiver referred to community resources (specify in notes) Provide education on fall prevention Provide education on personal and home safety Notes: Nutrition Nursing Diagnoses: Impaired glucose control: actual or potential Potential for alteratiion in Nutrition/Potential for imbalanced nutrition Goals: Patient/caregiver agrees to and verbalizes understanding of need to use nutritional supplements and/or vitamins as prescribed Date Initiated: 12/11/2020 Date Inactivated: 02/15/2021 Target Resolution Date: 03/09/2021 Goal Status: Met Patient/caregiver will maintain therapeutic glucose control Date Initiated: 12/11/2020 Target Resolution Date: 04/06/2021 Goal Status: Active Interventions: Assess HgA1c results as ordered upon admission and as needed Assess patient nutrition upon admission and as needed per policy Provide education on elevated blood sugars and impact on wound  healing Provide education on nutrition Treatment Activities: Education provided on Nutrition : 12/11/2020 Notes: Pressure Nursing Diagnoses: Knowledge deficit related to causes and risk factors for pressure ulcer development Knowledge deficit related to management of pressures ulcers Potential for impaired tissue integrity related to pressure, friction, moisture, and shear Goals: Patient/caregiver will verbalize risk factors for pressure ulcer development Date Initiated: 12/11/2020 Target Resolution Date: 04/06/2021 Goal Status: Active Patient/caregiver will verbalize understanding of pressure ulcer management Date Initiated: 12/11/2020 Date Inactivated: 02/15/2021 Target Resolution Date: 02/25/2021 Goal Status: Met Interventions: Assess: immobility, friction, shearing, incontinence upon admission and as needed Assess offloading mechanisms upon admission and as needed Assess potential for pressure ulcer upon admission and as needed Provide education on pressure ulcers Notes: Electronic Signature(s) Signed: 03/08/2021 6:31:57 PM By: Deon Pilling Entered By: Deon Pilling on 03/08/2021 13:27:22 -------------------------------------------------------------------------------- Pain Assessment Details Patient Name: Date of Service: [name] [name] 03/08/2021 12:30 PM Medical Record Number: [id number] Patient Account Number: [id number] Date of Birth/Sex: Treating RN: [birth year] (87 y.o. [name] Small Primary Care Sophiamarie Nease: Leonides Cave Other Clinician: Referring Elisheba Mcdonnell: Treating Illyanna Petillo/Extender: Valda Lamb Weeks in Treatment: 12 Active Problems Location of Pain Severity and Description of Pain Patient Has Paino No Site Locations Pain  Management and Medication Current Pain Management: Electronic Signature(s) Signed: 03/08/2021 5:13:18 PM By: Lorrin Jackson Entered By: Lorrin Jackson on 03/08/2021  12:39:29 -------------------------------------------------------------------------------- Patient/Caregiver Education Details Patient Name: Date of Service: [name] Small 4/28/2022andnbsp12:30 PM Medical Record Number: [id number] Patient Account Number: [id number] Date of Birth/Gender: Treating RN: 12/30/1927 (87 y.o. [name] Small, [name] Small Primary Care Physician: Leonides Cave Other Clinician: Referring Physician: Treating Physician/Extender: Rudolpho Sevin in Treatment: 12 Education Assessment Education Provided To: Patient Education Topics Provided Nutrition: Handouts: Nutrition Methods: Explain/Verbal Responses: Reinforcements needed Electronic Signature(s) Signed: 03/08/2021 6:31:57 PM By: Deon Pilling Entered By: Deon Pilling on 03/08/2021 13:27:42 -------------------------------------------------------------------------------- Wound Assessment Details Patient Name: Date of Service: [name] [name] 03/08/2021 12:30 PM Medical Record Number: [id number] Patient Account Number: [id number] Date of Birth/Sex: Treating RN: 1928/05/28 (87 y.o. [name] Small Primary Care Nyjai Graff: Leonides Cave Other Clinician: Referring Frederick Klinger: Treating Taylor Spilde/Extender: Valda Lamb Weeks in Treatment: 12 Wound Status Wound Number: 1 Primary Diabetic Wound/Ulcer of the Lower Extremity Etiology: Wound Location: Right Calcaneus Secondary Pressure Ulcer Wounding Event: Pressure Injury Etiology: Date Acquired: 08/11/2020 Wound Open Weeks Of Treatment: 12 Status: Clustered Wound: No Comorbid Chronic Obstructive Pulmonary Disease (COPD), Congestive History: Heart Failure, Coronary Artery Disease, Hypertension, Peripheral Arterial Disease, Type II Diabetes, Neuropathy Photos Wound Measurements Length: (cm) 1.7 Width: (cm) 2.5 Depth: (cm) 0.8 Area: (cm) 3.338 Volume: (cm) 2.67 % Reduction in Area: 54% %  Reduction in Volume: -267.8% Epithelialization: Medium (34-66%) Tunneling: No Undermining: No Wound Description Classification: Grade 2 Wound Margin: Epibole Exudate Amount: Medium Exudate Type: Serosanguineous Exudate Color: red, brown Foul Odor After Cleansing: No Slough/Fibrino Yes Wound Bed Granulation Amount: Large (67-100%) Exposed Structure Granulation Quality: Pink, Pale Fascia Exposed: No Necrotic Amount: Small (1-33%) Fat Layer (Subcutaneous Tissue) Exposed: Yes Necrotic Quality: Adherent Slough Tendon Exposed: No Muscle Exposed: No Joint Exposed: No Bone Exposed: No Assessment Notes Calloused Periwound Treatment Notes Wound #1 (Calcaneus) Wound Laterality: Right Cleanser Wound Cleanser Discharge Instruction: Cleanse the wound with wound cleanser or normal saline prior to applying a clean dressing using gauze sponges, not tissue or cotton balls. Peri-Wound Care Topical Primary Dressing Santyl Ointment Discharge Instruction: Apply nickel thick amount to wound bed backing with saline moisten gauzes as instructed. Secondary Dressing Woven Gauze Sponge, Non-Sterile 4x4 in Discharge Instruction: wet to dry packing the Prisma. Apply over primary dressing as directed. ALLEVYN Heel 4 1/2in x 5 1/2in / 10.5cm x 13.5cm Discharge Instruction: Apply over primary dressing as directed. Secured With [employer], 4.5x3.1 (in/yd) Discharge Instruction: Secure with Kerlix as directed. Paper Tape, 2x10 (in/yd) Discharge Instruction: Secure dressing with tape as directed. Compression Wrap Compression Stockings Add-Ons Electronic Signature(s) Signed: 03/08/2021 5:13:18 PM By: Lorrin Jackson Signed: 03/09/2021 4:35:52 PM By: Sandre Kitty Entered By: Sandre Kitty on 03/08/2021 16:27:42 -------------------------------------------------------------------------------- Vitals Details Patient Name: Date of Service: [name] [name] 03/08/2021 12:30 PM Medical  Record Number: [id number] Patient Account Number: [id number] Date of Birth/Sex: Treating RN: 1928-04-23 (87 y.o. [name] Small Primary Care Thanvi Blincoe: Leonides Cave Other Clinician: Referring Joniya Boberg: Treating Tamalyn Wadsworth/Extender: Valda Lamb Weeks in Treatment: 12 Vital Signs Time Taken: 12:38 Temperature (F): 98 Height (in): 74 Pulse (bpm): 68 Weight (lbs): 205 Respiratory Rate (breaths/min): 18 Body Mass Index (BMI): 26.3 Blood Pressure (mmHg): 170/66 Capillary Blood Glucose (mg/dl): 180 Reference Range: 80 - 120 mg / dl Electronic Signature(s) Signed: 03/08/2021 5:13:18 PM By: Lorrin Jackson Entered By: Onnie Boer  Jodi on 03/08/2021 12:39:22

## 2021-03-10 DIAGNOSIS — R062 Wheezing: Secondary | ICD-10-CM | POA: Diagnosis not present

## 2021-03-10 DIAGNOSIS — J449 Chronic obstructive pulmonary disease, unspecified: Secondary | ICD-10-CM | POA: Diagnosis not present

## 2021-03-10 DIAGNOSIS — J9600 Acute respiratory failure, unspecified whether with hypoxia or hypercapnia: Secondary | ICD-10-CM | POA: Diagnosis not present

## 2021-03-10 DIAGNOSIS — I509 Heart failure, unspecified: Secondary | ICD-10-CM | POA: Diagnosis not present

## 2021-03-12 DIAGNOSIS — Z9181 History of falling: Secondary | ICD-10-CM | POA: Diagnosis not present

## 2021-03-12 DIAGNOSIS — E785 Hyperlipidemia, unspecified: Secondary | ICD-10-CM | POA: Diagnosis not present

## 2021-03-12 DIAGNOSIS — I110 Hypertensive heart disease with heart failure: Secondary | ICD-10-CM | POA: Diagnosis not present

## 2021-03-12 DIAGNOSIS — L97412 Non-pressure chronic ulcer of right heel and midfoot with fat layer exposed: Secondary | ICD-10-CM | POA: Diagnosis not present

## 2021-03-12 DIAGNOSIS — Z8551 Personal history of malignant neoplasm of bladder: Secondary | ICD-10-CM | POA: Diagnosis not present

## 2021-03-12 DIAGNOSIS — E11621 Type 2 diabetes mellitus with foot ulcer: Secondary | ICD-10-CM | POA: Diagnosis not present

## 2021-03-12 DIAGNOSIS — J439 Emphysema, unspecified: Secondary | ICD-10-CM | POA: Diagnosis not present

## 2021-03-12 DIAGNOSIS — I509 Heart failure, unspecified: Secondary | ICD-10-CM | POA: Diagnosis not present

## 2021-03-12 DIAGNOSIS — E1151 Type 2 diabetes mellitus with diabetic peripheral angiopathy without gangrene: Secondary | ICD-10-CM | POA: Diagnosis not present

## 2021-03-12 DIAGNOSIS — E1142 Type 2 diabetes mellitus with diabetic polyneuropathy: Secondary | ICD-10-CM | POA: Diagnosis not present

## 2021-03-16 ENCOUNTER — Ambulatory Visit: Payer: Medicare Other | Admitting: Family Medicine

## 2021-03-19 DIAGNOSIS — E785 Hyperlipidemia, unspecified: Secondary | ICD-10-CM | POA: Diagnosis not present

## 2021-03-19 DIAGNOSIS — I110 Hypertensive heart disease with heart failure: Secondary | ICD-10-CM | POA: Diagnosis not present

## 2021-03-19 DIAGNOSIS — E11621 Type 2 diabetes mellitus with foot ulcer: Secondary | ICD-10-CM | POA: Diagnosis not present

## 2021-03-19 DIAGNOSIS — I509 Heart failure, unspecified: Secondary | ICD-10-CM | POA: Diagnosis not present

## 2021-03-19 DIAGNOSIS — Z9181 History of falling: Secondary | ICD-10-CM | POA: Diagnosis not present

## 2021-03-19 DIAGNOSIS — E1142 Type 2 diabetes mellitus with diabetic polyneuropathy: Secondary | ICD-10-CM | POA: Diagnosis not present

## 2021-03-19 DIAGNOSIS — Z8551 Personal history of malignant neoplasm of bladder: Secondary | ICD-10-CM | POA: Diagnosis not present

## 2021-03-19 DIAGNOSIS — L97412 Non-pressure chronic ulcer of right heel and midfoot with fat layer exposed: Secondary | ICD-10-CM | POA: Diagnosis not present

## 2021-03-19 DIAGNOSIS — J439 Emphysema, unspecified: Secondary | ICD-10-CM | POA: Diagnosis not present

## 2021-03-19 DIAGNOSIS — E1151 Type 2 diabetes mellitus with diabetic peripheral angiopathy without gangrene: Secondary | ICD-10-CM | POA: Diagnosis not present

## 2021-03-20 NOTE — Progress Notes (Signed)
AHRON, HULBERT ([id number]) Visit Report for 03/08/2021 Chief Complaint Document Details Patient Name: Date of Service: [name] Small, [name] Small  RIO N. 03/08/2021 12:30 PM Medical Record Number: [id number] Patient Account Number: [id number] Date of Birth/Sex: Treating RN: December 11, 1927 (87 y.o. [name], [name] Primary Care Provider: Leonides Cave Other Clinician: Referring Provider: Treating Provider/Extender: Valda Lamb Weeks in Treatment: 12 Information Obtained from: Patient Chief Complaint 12/11/2020; patient is here for evaluation of a necrotic wound on the tip of his right heel Electronic Signature(s) Signed: 03/08/2021 1:54:07 PM By: Kalman Shan DO Entered By: Kalman Shan on 03/08/2021 13:45:20 -------------------------------------------------------------------------------- Debridement Details Patient Name: Date of Service: [name] [name] 03/08/2021 12:30 PM Medical Record Number: [id number] Patient Account Number: [id number] Date of Birth/Sex: Treating RN: 1928-02-15 (87 y.o. [name], [name] Primary Care Provider: Leonides Cave Other Clinician: Referring Provider: Treating Provider/Extender: Valda Lamb Weeks in Treatment: 12 Debridement Performed for Assessment: Wound #1 Right Calcaneus Performed By: Physician Kalman Shan, DO Debridement Type: Debridement Severity of Tissue Pre Debridement: Fat layer exposed Level of Consciousness (Pre-procedure): Awake and Alert Pre-procedure Verification/Time Out Yes - 13:40 Taken: Start Time: 13:41 Pain Control: Lidocaine 4% T opical Solution T Area Debrided (L x W): otal 1.7 (cm) x 2.5 (cm) = 4.25 (cm) Tissue and other material debrided: Viable, Non-Viable, Slough, Subcutaneous, Skin: Dermis , Skin: Epidermis, Fibrin/Exudate, Slough Level: Skin/Subcutaneous Tissue Debridement Description: Excisional Instrument: Curette Bleeding: Moderate Hemostasis Achieved:  Pressure End Time: 13:48 Procedural Pain: 0 Post Procedural Pain: 0 Response to Treatment: Procedure was tolerated well Level of Consciousness (Post- Awake and Alert procedure): Post Debridement Measurements of Total Wound Length: (cm) 1.7 Width: (cm) 2.5 Depth: (cm) 0.8 Volume: (cm) 2.67 Character of Wound/Ulcer Post Debridement: Improved Severity of Tissue Post Debridement: Fat layer exposed Post Procedure Diagnosis Same as Pre-procedure Electronic Signature(s) Signed: 03/08/2021 6:31:57 PM By: Deon Pilling Signed: 03/20/2021 3:23:56 PM By: Kalman Shan DO Entered By: Deon Pilling on 03/08/2021 15:53:54 -------------------------------------------------------------------------------- HPI Details Patient Name: Date of Service: [name] [name] 03/08/2021 12:30 PM Medical Record Number: [id number] Patient Account Number: [id number] Date of Birth/Sex: Treating RN: August 28, 1928 (87 y.o. [name], [name] Primary Care Provider: Leonides Cave Other Clinician: Referring Provider: Treating Provider/Extender: Valda Lamb Weeks in Treatment: 12 History of Present Illness HPI Description: ADMISSION 12/11/2020 This is a 87 year old man who apparently fractured his left hip in June and then had a subsequent fall and femur fracture requiring a separate surgery. He spent a long time at rehab at [facility]. Sometime in October his wife noted a blister on the left heel that is gradually morphed into a pressure ulcer. He saw his primary physician on 11/07/2020 and he was referred here. The patient had prior problems with PAD on the left. His last arterial studies in September 2020 showed an ABI on the right of 1.03 with a TBI of 0.73 and biphasic waveforms on the left a ABI of 0.84, TBI's were not done because the toe was surgically absent. He had monophasic and biphasic waveforms. In 2017 he had an angiogram showing a mom diffusely diseased SFA and  occluded left popliteal he underwent a left popliteal and left peroneal artery angioplasty. Noteworthy on the LEFT the peroneal was the only patent vessel below the knee. I do not think his right side was actually studied Past medical history; type 2 diabetes on insulin with peripheral neuropathy, hypertension, hyperlipidemia, hip fracture and subsequent femur  fracture is noted, and emphysema, history of bladder cancer requiring in and out catheterizations, coronary artery disease, BPH. Updated ABI in our clinic was 0.93 on the right 2/14 Santyl to continue. Mechanical debridement 2/21; using Santyl. Still requiring mechanical debridement today which was disappointing. 3/15; 3-week follow-up. Using Santyl but still requiring mechanical debridements in the same spot in this wound. Most of the rest of the surface of this wound looks satisfactory however in the most proximal part filled with mostly necrotic tissue. 3/29; 2-week follow-up. They have been using Santyl the patient's wife is changing the dressing as well as home health. Culture I did showed both Enterococcus and Klebsiella he was treated with 7 days of Augmentin but I am going to extend this. X-ray was negative for osteomyelitis 4/7; weekly follow-up. I changed him to silver collagen last week no real improvement here. He has completed the Augmentin. He still has really 2 wounds part of it filled then but a deeper part that is of probing area right to the bone itself or at least close to it. A lot of necrotic debris continues in this area. We ran Dermagraft the last time he was here however it is being discontinued. Moreover in consideration of a skin substitute, we would need to have something that could fold and be molded into the wound itself 4/14; weekly follow-up. Silver collagen. I am attempting to get epi fix to try and close down the superior divot. CT scan of this area did not show underlying osteomyelitis. In fact they mention  that the wound does not go to bone. Certainly felt like it did clinically however. Unfortunately to get epi fix we are going to need a hemoglobin A1c. His wife states they just had lab work done at the [place] and we will see if we can get a copy of the hemoglobin A1c. The patient says it was 8.1 4/28; patient presents for 2-week follow-up. He has been using silver collagen every other day to the right calcaneus. He has no complaints or issues today. He denies any fever/chills, purulent drainage, erythema or increased warmth to the foot Electronic Signature(s) Signed: 03/08/2021 1:54:07 PM By: Kalman Shan DO Entered By: Kalman Shan on 03/08/2021 13:46:09 -------------------------------------------------------------------------------- Physical Exam Details Patient Name: Date of Service: [name] [name] 03/08/2021 12:30 PM Medical Record Number: [id number] Patient Account Number: [id number] Date of Birth/Sex: Treating RN: 05/30/1928 (87 y.o. Hessie Diener Primary Care Provider: Other Clinician: Leonides Cave Referring Provider: Treating Provider/Extender: Valda Lamb Weeks in Treatment: 12 Constitutional respirations regular, non-labored and within target range for patient.. Cardiovascular 2+ dorsalis pedis/posterior tibialis pulses. Psychiatric pleasant and cooperative. Notes Right foot: Calcaneus with open wound. There is necrotic debris throughout. Circumferential callus noted. No signs of infection noted. Electronic Signature(s) Signed: 03/08/2021 1:54:07 PM By: Kalman Shan DO Entered By: Kalman Shan on 03/08/2021 13:46:23 -------------------------------------------------------------------------------- Physician Orders Details Patient Name: Date of Service: [name] [name] 03/08/2021 12:30 PM Medical Record Number: [id number] Patient Account Number: [id number] Date of Birth/Sex: Treating RN: [birth year] (87 y.o. [name],  [name] Primary Care Provider: Leonides Cave Other Clinician: Referring Provider: Treating Provider/Extender: Valda Lamb Weeks in Treatment: 12 Verbal / Phone Orders: No Diagnosis Coding ICD-10 Coding Code Description E11.621 Type 2 diabetes mellitus with foot ulcer L97.418 Non-pressure chronic ulcer of right heel and midfoot with other specified severity E11.51 Type 2 diabetes mellitus with diabetic peripheral angiopathy without gangrene L03.115 Cellulitis of right  lower limb E11.42 Type 2 diabetes mellitus with diabetic polyneuropathy Follow-up Appointments ppointment in 2 weeks. - Dr. Dellia Nims Return A Cellular or Tissue Based Products Cellular or Tissue Based Product Type: - Epifix has been approved by insurance. Will order when wound is ready. Bathing/ Shower/ Hygiene May shower and wash wound with soap and water. - prior to dressing change Edema Control - Lymphedema / SCD / Other Elevate legs to the level of the heart or above for 30 minutes daily and/or when sitting, a frequency of: Avoid standing for long periods of time. Off-Loading Heel suspension boot to: - Gloped heel offloading sandal to right foot Turn and reposition every 2 hours Other: - float heels off of bed and chair by placing pillow under calves [place] wound care orders this week; continue [place] for wound care. May utilize formulary equivalent dressing for wound treatment orders unless otherwise specified. - [facility] for wound care 2-3x a week. ****Santyl apply directly to wound bed daily backing with saline moisten gauze. Wife to change all other days. Perform this for once a week.*** Home Health in one week to note if necrotic tissue has been removed from wound bed then change to Prisma as primary. Home Health then change 2- 3 times a week and wife every other day. ***IF NECROTIC TISSUE REMAINS IN WOUND CONTINUE SANTYL.*** Wound Treatment Wound #1 - Calcaneus Wound  Laterality: Right Cleanser: Wound Cleanser (Home Health) 1 x Per Day/15 Days Discharge Instructions: Cleanse the wound with wound cleanser or normal saline prior to applying a clean dressing using gauze sponges, not tissue or cotton balls. Prim Dressing: Santyl Ointment (Home Health) 1 x Per Day/15 Days ary Discharge Instructions: Apply nickel thick amount to wound bed backing with saline moisten gauzes as instructed. Secondary Dressing: Woven Gauze Sponge, Non-Sterile 4x4 in (Home Health) (Generic) 1 x Per Day/15 Days Discharge Instructions: wet to dry packing the Prisma. Apply over primary dressing as directed. Secondary Dressing: ALLEVYN Heel 4 1/2in x 5 1/2in / 10.5cm x 13.5cm (Home Health) (Generic) 1 x Per Day/15 Days Discharge Instructions: Apply over primary dressing as directed. Secured With: [employer], 4.5x3.1 (in/yd) (Home Health) (Generic) 1 x Per Day/15 Days Discharge Instructions: Secure with Kerlix as directed. Secured With: Paper Tape, 2x10 (in/yd) (Home Health) (Generic) 1 x Per Day/15 Days Discharge Instructions: Secure dressing with tape as directed. Electronic Signature(s) Signed: 03/08/2021 1:54:07 PM By: Kalman Shan DO Entered By: Kalman Shan on 03/08/2021 13:46:52 -------------------------------------------------------------------------------- Problem List Details Patient Name: Date of Service: [name] [name] 03/08/2021 12:30 PM Medical Record Number: [id number] Patient Account Number: [id number] Date of Birth/Sex: Treating RN: 1927/11/18 (87 y.o. [name], [name] Primary Care Provider: Leonides Cave Other Clinician: Referring Provider: Treating Provider/Extender: Valda Lamb Weeks in Treatment: 12 Active Problems ICD-10 Encounter Code Description Active Date MDM Diagnosis E11.621 Type 2 diabetes mellitus with foot ulcer 12/11/2020 No Yes L97.418 Non-pressure chronic ulcer of right heel and midfoot with  other specified 12/11/2020 No Yes severity E11.51 Type 2 diabetes mellitus with diabetic peripheral angiopathy without gangrene 12/11/2020 No Yes L03.115 Cellulitis of right lower limb 02/06/2021 No Yes E11.42 Type 2 diabetes mellitus with diabetic polyneuropathy 12/11/2020 No Yes Inactive Problems Resolved Problems Electronic Signature(s) Signed: 03/08/2021 1:54:07 PM By: Kalman Shan DO Entered By: Kalman Shan on 03/08/2021 13:44:59 -------------------------------------------------------------------------------- Progress Note Details Patient Name: Date of Service: [name] [name] 03/08/2021 12:30 PM Medical Record Number: [id number] Patient Account Number: [id number]  Date of Birth/Sex: Treating RN: 10-05-1928 (87 y.o. [name], [name] Primary Care Provider: Leonides Cave Other Clinician: Referring Provider: Treating Provider/Extender: Valda Lamb Weeks in Treatment: 12 Subjective Chief Complaint Information obtained from Patient 12/11/2020; patient is here for evaluation of a necrotic wound on the tip of his right heel History of Present Illness (HPI) ADMISSION 12/11/2020 This is a 87 year old man who apparently fractured his left hip in June and then had a subsequent fall and femur fracture requiring a separate surgery. He spent a long time at rehab at [facility] skilled facility. Sometime in October his wife noted a blister on the left heel that is gradually morphed into a pressure ulcer. He saw his primary physician on 11/07/2020 and he was referred here. The patient had prior problems with PAD on the left. His last arterial studies in September 2020 showed an ABI on the right of 1.03 with a TBI of 0.73 and biphasic waveforms on the left a ABI of 0.84, TBI's were not done because the toe was surgically absent. He had monophasic and biphasic waveforms. In 2017 he had an angiogram showing a mom diffusely diseased SFA and occluded left popliteal he  underwent a left popliteal and left peroneal artery angioplasty. Noteworthy on the LEFT the peroneal was the only patent vessel below the knee. I do not think his right side was actually studied Past medical history; type 2 diabetes on insulin with peripheral neuropathy, hypertension, hyperlipidemia, hip fracture and subsequent femur fracture is noted, and emphysema, history of bladder cancer requiring in and out catheterizations, coronary artery disease, BPH. Updated ABI in our clinic was 0.93 on the right 2/14 Santyl to continue. Mechanical debridement 2/21; using Santyl. Still requiring mechanical debridement today which was disappointing. 3/15; 3-week follow-up. Using Santyl but still requiring mechanical debridements in the same spot in this wound. Most of the rest of the surface of this wound looks satisfactory however in the most proximal part filled with mostly necrotic tissue. 3/29; 2-week follow-up. They have been using Santyl the patient's wife is changing the dressing as well as home health. Culture I did showed both Enterococcus and Klebsiella he was treated with 7 days of Augmentin but I am going to extend this. X-ray was negative for osteomyelitis 4/7; weekly follow-up. I changed him to silver collagen last week no real improvement here. He has completed the Augmentin. He still has really 2 wounds part of it filled then but a deeper part that is of probing area right to the bone itself or at least close to it. A lot of necrotic debris continues in this area. We ran Dermagraft the last time he was here however it is being discontinued. Moreover in consideration of a skin substitute, we would need to have something that could fold and be molded into the wound itself 4/14; weekly follow-up. Silver collagen. I am attempting to get epi fix to try and close down the superior divot. CT scan of this area did not show underlying osteomyelitis. In fact they mention that the wound does not go to  bone. Certainly felt like it did clinically however. Unfortunately to get epi fix we are going to need a hemoglobin A1c. His wife states they just had lab work done at the [place] and we will see if we can get a copy of the hemoglobin A1c. The patient says it was 8.1 4/28; patient presents for 2-week follow-up. He has been using silver collagen every other day to the right  calcaneus. He has no complaints or issues today. He denies any fever/chills, purulent drainage, erythema or increased warmth to the foot Patient History Information obtained from Patient. Family History Diabetes - Mother, Heart Disease - Siblings, No family history of Cancer, Hereditary Spherocytosis, Hypertension, Kidney Disease, Lung Disease, Seizures, Stroke, Thyroid Problems, Tuberculosis. Social History Former smoker, Marital Status - Married, Alcohol Use - Rarely, Drug Use - No History, Caffeine Use - Rarely. Medical History Eyes Denies history of Cataracts, Glaucoma, Optic Neuritis Ear/Nose/Mouth/Throat Denies history of Chronic sinus problems/congestion, Middle ear problems Hematologic/Lymphatic Denies history of Anemia, Hemophilia, Human Immunodeficiency Virus, Lymphedema, Sickle Cell Disease Respiratory Patient has history of Chronic Obstructive Pulmonary Disease (COPD) Denies history of Aspiration, Asthma, Pneumothorax, Sleep Apnea, Tuberculosis Cardiovascular Patient has history of Congestive Heart Failure, Coronary Artery Disease, Hypertension, Peripheral Arterial Disease Denies history of Angina, Arrhythmia, Deep Vein Thrombosis, Hypotension, Myocardial Infarction, Peripheral Venous Disease, Phlebitis, Vasculitis Gastrointestinal Denies history of Cirrhosis , Colitis, Crohnoos, Hepatitis A, Hepatitis B, Hepatitis C Endocrine Patient has history of Type II Diabetes Denies history of Type I Diabetes Genitourinary Denies history of End Stage Renal Disease Immunological Denies history of Lupus Erythematosus,  Raynaudoos, Scleroderma Integumentary (Skin) Denies history of History of Burn Musculoskeletal Denies history of Gout, Rheumatoid Arthritis, Osteoarthritis, Osteomyelitis Neurologic Patient has history of Neuropathy Denies history of Dementia, Quadriplegia, Paraplegia, Seizure Disorder Oncologic Denies history of Received Chemotherapy, Received Radiation Hospitalization/Surgery History - transmet amp left foot. - appendectomy. - biopsy. - partial first amputation ray; left. - cardiac catheterization. - femur IM nail. - lumbar laminectomy/ decompression. - ORIF femur fracture. - penile prosthesis implant. - peripheral vascular catheterization. - total hip arthroplasty. - transurethral resection of bladder tumor. Medical A Surgical History Notes nd Respiratory 2L  at nighttime Genitourinary chronic kidney disease stage 3, BPH, pt. gets catheterized daily 5-6 x d/t inability to urinate Oncologic hx of bladder cancer Objective Constitutional respirations regular, non-labored and within target range for patient.. Vitals Time Taken: 12:38 PM, Height: 74 in, Weight: 205 lbs, BMI: 26.3, Temperature: 98 F, Pulse: 68 bpm, Respiratory Rate: 18 breaths/min, Blood Pressure: 170/66 mmHg, Capillary Blood Glucose: 180 mg/dl. Cardiovascular 2+ dorsalis pedis/posterior tibialis pulses. Psychiatric pleasant and cooperative. General Notes: Right foot: Calcaneus with open wound. There is necrotic debris throughout. Circumferential callus noted. No signs of infection noted. Integumentary (Hair, Skin) Wound #1 status is Open. Original cause of wound was Pressure Injury. The date acquired was: 08/11/2020. The wound has been in treatment 12 weeks. The wound is located on the Right Calcaneus. The wound measures 1.7cm length x 2.5cm width x 0.8cm depth; 3.338cm^2 area and 2.67cm^3 volume. There is Fat Layer (Subcutaneous Tissue) exposed. There is no tunneling or undermining noted. There is a medium amount  of serosanguineous drainage noted. The wound margin is epibole. There is large (67-100%) pink, pale granulation within the wound bed. There is a small (1-33%) amount of necrotic tissue within the wound bed including Adherent Slough. General Notes: Calloused Periwound Assessment Active Problems ICD-10 Type 2 diabetes mellitus with foot ulcer Non-pressure chronic ulcer of right heel and midfoot with other specified severity Type 2 diabetes mellitus with diabetic peripheral angiopathy without gangrene Cellulitis of right lower limb Type 2 diabetes mellitus with diabetic polyneuropathy There is still a lot of necrotic tissue present in the wound bed. This was debrided with a curette today. I was not able to remove all of it and think he would benefit from [place]. He does not want to come in more than  every 2 weeks. I recommended he use Santyl for a week and if the wound bed was free of necrotic tissue he can reapply collagen daily. He does have home health and they come out 2-3 times a week. There are no signs of infection today. We had a discussion about epi fix and they were going to think about this option. We also discussed the importance of aggressive offloading. Procedures Wound #1 Pre-procedure diagnosis of Wound #1 is a Diabetic Wound/Ulcer of the Lower Extremity located on the Right Calcaneus .Severity of Tissue Pre Debridement is: Fat layer exposed. There was a Excisional Skin/Subcutaneous Tissue Debridement with a total area of 4.25 sq cm performed by Kalman Shan, DO. With the following instrument(s): Curette to remove Viable and Non-Viable tissue/material. Material removed includes Subcutaneous Tissue, Slough, Skin: Dermis, Skin: Epidermis, and Fibrin/Exudate after achieving pain control using Lidocaine 4% Topical Solution. A time out was conducted at 13:40, prior to the start of the procedure. A Moderate amount of bleeding was controlled with Pressure. The procedure was tolerated  well with a pain level of 0 throughout and a pain level of 0 following the procedure. Post Debridement Measurements: 1.7cm length x 2.5cm width x 0.8cm depth; 2.67cm^3 volume. Character of Wound/Ulcer Post Debridement is improved. Severity of Tissue Post Debridement is: Fat layer exposed. Post procedure Diagnosis Wound #1: Same as Pre-Procedure Plan Follow-up Appointments: Return Appointment in 2 weeks. - Dr. Dellia Nims Cellular or Tissue Based Products: Cellular or Tissue Based Product Type: - Epifix has been approved by insurance. Will order when wound is ready. Bathing/ Shower/ Hygiene: May shower and wash wound with soap and water. - prior to dressing change Edema Control - Lymphedema / SCD / Other: Elevate legs to the level of the heart or above for 30 minutes daily and/or when sitting, a frequency of: Avoid standing for long periods of time. Off-Loading: Heel suspension boot to: - Gloped heel offloading sandal to right foot Turn and reposition every 2 hours Other: - float heels off of bed and chair by placing pillow under calves Home Health: New wound care orders this week; continue Home Health for wound care. May utilize formulary equivalent dressing for wound treatment orders unless otherwise specified. - [facility] for wound care 2-3x a week. ****Santyl apply directly to wound bed daily backing with saline moisten gauze. Wife to change all other days. Perform this for once a week.*** Home Health in one week to note if necrotic tissue has been removed from wound bed then change to Prisma as primary. Home Health then change 2-3 times a week and wife every other day. ***IF NECROTIC TISSUE REMAINS IN WOUND CONTINUE SANTYL.*** WOUND #1: - Calcaneus Wound Laterality: Right Cleanser: Wound Cleanser (Home Health) 1 x Per Day/15 Days Discharge Instructions: Cleanse the wound with wound cleanser or normal saline prior to applying a clean dressing using gauze sponges, not tissue or cotton  balls. Prim Dressing: Santyl Ointment (Home Health) 1 x Per Day/15 Days ary Discharge Instructions: Apply nickel thick amount to wound bed backing with saline moisten gauzes as instructed. Secondary Dressing: Woven Gauze Sponge, Non-Sterile 4x4 in (Home Health) (Generic) 1 x Per Day/15 Days Discharge Instructions: wet to dry packing the Prisma. Apply over primary dressing as directed. Secondary Dressing: ALLEVYN Heel 4 1/2in x 5 1/2in / 10.5cm x 13.5cm (Home Health) (Generic) 1 x Per Day/15 Days Discharge Instructions: Apply over primary dressing as directed. Secured With: [employer], 4.5x3.1 (in/yd) (Home Health) (Generic) 1 x Per Day/15 Days  Discharge Instructions: Secure with Kerlix as directed. Secured With: Paper T ape, 2x10 (in/yd) (Home Health) (Generic) 1 x Per Day/15 Days Discharge Instructions: Secure dressing with tape as directed. 1. Santyl daily for 1 week. If the wound bed looks free of necrotic tissue can start collagen daily. If it is not free of necrotic tissue can continue Santyl until follow-up. 2. Aggressive offloading 3. Follow-up in 2 weeks Electronic Signature(s) Signed: 03/08/2021 6:31:57 PM By: Deon Pilling Signed: 03/20/2021 3:23:56 PM By: Kalman Shan DO Previous Signature: 03/08/2021 1:54:07 PM Version By: Kalman Shan DO Entered By: Deon Pilling on 03/08/2021 15:54:07 -------------------------------------------------------------------------------- HxROS Details Patient Name: Date of Service: [name] [name] 03/08/2021 12:30 PM Medical Record Number: [id number] Patient Account Number: [id number] Date of Birth/Sex: Treating RN: May 03, 1928 (87 y.o. Hessie Diener Primary Care Provider: Leonides Cave Other Clinician: Referring Provider: Treating Provider/Extender: Valda Lamb Weeks in Treatment: 12 Information Obtained From Patient Eyes Medical History: Negative for: Cataracts; Glaucoma; Optic  Neuritis Ear/Nose/Mouth/Throat Medical History: Negative for: Chronic sinus problems/congestion; Middle ear problems Hematologic/Lymphatic Medical History: Negative for: Anemia; Hemophilia; Human Immunodeficiency Virus; Lymphedema; Sickle Cell Disease Respiratory Medical History: Positive for: Chronic Obstructive Pulmonary Disease (COPD) Negative for: Aspiration; Asthma; Pneumothorax; Sleep Apnea; Tuberculosis Past Medical History Notes: 2L [place] at nighttime Cardiovascular Medical History: Positive for: Congestive Heart Failure; Coronary Artery Disease; Hypertension; Peripheral Arterial Disease Negative for: Angina; Arrhythmia; Deep Vein Thrombosis; Hypotension; Myocardial Infarction; Peripheral Venous Disease; Phlebitis; Vasculitis Gastrointestinal Medical History: Negative for: Cirrhosis ; Colitis; Crohns; Hepatitis A; Hepatitis B; Hepatitis C Endocrine Medical History: Positive for: Type II Diabetes Negative for: Type I Diabetes Time with diabetes: 25-30 yrs Blood sugar tested every day: Yes Tested : Genitourinary Medical History: Negative for: End Stage Renal Disease Past Medical History Notes: chronic kidney disease stage 3, BPH, pt. gets catheterized daily 5-6 x d/t inability to urinate Immunological Medical History: Negative for: Lupus Erythematosus; Raynauds; Scleroderma Integumentary (Skin) Medical History: Negative for: History of Burn Musculoskeletal Medical History: Negative for: Gout; Rheumatoid Arthritis; Osteoarthritis; Osteomyelitis Neurologic Medical History: Positive for: Neuropathy Negative for: Dementia; Quadriplegia; Paraplegia; Seizure Disorder Oncologic Medical History: Negative for: Received Chemotherapy; Received Radiation Past Medical History Notes: hx of bladder cancer Immunizations Pneumococcal Vaccine: Received Pneumococcal Vaccination: Yes Immunization Notes: pt. doesn't remember date on tetanus shot Implantable  Devices None Hospitalization / Surgery History Type of Hospitalization/Surgery transmet amp left foot appendectomy biopsy partial first amputation ray; left cardiac catheterization femur IM nail lumbar laminectomy/ decompression ORIF femur fracture penile prosthesis implant peripheral vascular catheterization total hip arthroplasty transurethral resection of bladder tumor Family and Social History Cancer: No; Diabetes: Yes - Mother; Heart Disease: Yes - Siblings; Hereditary Spherocytosis: No; Hypertension: No; Kidney Disease: No; Lung Disease: No; Seizures: No; Stroke: No; Thyroid Problems: No; Tuberculosis: No; Former smoker; Marital Status - Married; Alcohol Use: Rarely; Drug Use: No History; Caffeine Use: Rarely; Financial Concerns: No; Food, Clothing or Shelter Needs: No; Support System Lacking: No; Transportation Concerns: No Electronic Signature(s) Signed: 03/08/2021 1:54:07 PM By: Kalman Shan DO Signed: 03/08/2021 6:31:57 PM By: Deon Pilling Entered By: Kalman Shan on 03/08/2021 13:46:16 -------------------------------------------------------------------------------- SuperBill Details Patient Name: Date of Service: [name] [name] 03/08/2021 Medical Record Number: [id number] Patient Account Number: [id number] Date of Birth/Sex: Treating RN: 1928/01/23 (87 y.o. [name], [name] Primary Care Provider: Leonides Cave Other Clinician: Referring Provider: Treating Provider/Extender: Valda Lamb Weeks in Treatment: 12 Diagnosis Coding ICD-10 Codes Code Description [phone] Type 2  diabetes mellitus with foot ulcer L97.418 Non-pressure chronic ulcer of right heel and midfoot with other specified severity E11.51 Type 2 diabetes mellitus with diabetic peripheral angiopathy without gangrene L03.115 Cellulitis of right lower limb E11.42 Type 2 diabetes mellitus with diabetic polyneuropathy Facility Procedures CPT4 Code:  [id number] Description: 98473 - DEB SUBQ TISSUE 20 SQ CM/< ICD-10 Diagnosis Description L97.418 Non-pressure chronic ulcer of right heel and midfoot with other specified sev Modifier: erity Quantity: 1 Electronic Signature(s) Signed: 03/08/2021 6:31:57 PM By: Deon Pilling Signed: 03/20/2021 3:23:56 PM By: Kalman Shan DO Previous Signature: 03/08/2021 1:54:07 PM Version By: Kalman Shan DO Entered By: Deon Pilling on 03/08/2021 15:54:26

## 2021-03-22 ENCOUNTER — Other Ambulatory Visit (HOSPITAL_COMMUNITY)
Admission: RE | Admit: 2021-03-22 | Discharge: 2021-03-22 | Disposition: A | Discharge status: Home / Self Care | Payer: Medicare Other | Source: Other Acute Inpatient Hospital | Attending: Internal Medicine | Admitting: Internal Medicine

## 2021-03-22 ENCOUNTER — Encounter (HOSPITAL_BASED_OUTPATIENT_CLINIC_OR_DEPARTMENT_OTHER): Payer: Medicare Other | Attending: Internal Medicine | Admitting: Internal Medicine

## 2021-03-22 ENCOUNTER — Other Ambulatory Visit: Payer: Self-pay

## 2021-03-22 DIAGNOSIS — I509 Heart failure, unspecified: Secondary | ICD-10-CM | POA: Diagnosis not present

## 2021-03-22 DIAGNOSIS — J449 Chronic obstructive pulmonary disease, unspecified: Secondary | ICD-10-CM | POA: Diagnosis not present

## 2021-03-22 DIAGNOSIS — I2510 Atherosclerotic heart disease of native coronary artery without angina pectoris: Secondary | ICD-10-CM | POA: Diagnosis not present

## 2021-03-22 DIAGNOSIS — E1151 Type 2 diabetes mellitus with diabetic peripheral angiopathy without gangrene: Secondary | ICD-10-CM | POA: Diagnosis not present

## 2021-03-22 DIAGNOSIS — R062 Wheezing: Secondary | ICD-10-CM | POA: Diagnosis not present

## 2021-03-22 DIAGNOSIS — Z794 Long term (current) use of insulin: Secondary | ICD-10-CM | POA: Diagnosis not present

## 2021-03-22 DIAGNOSIS — L97418 Non-pressure chronic ulcer of right heel and midfoot with other specified severity: Secondary | ICD-10-CM | POA: Insufficient documentation

## 2021-03-22 DIAGNOSIS — L03115 Cellulitis of right lower limb: Secondary | ICD-10-CM | POA: Diagnosis not present

## 2021-03-22 DIAGNOSIS — E1142 Type 2 diabetes mellitus with diabetic polyneuropathy: Secondary | ICD-10-CM | POA: Insufficient documentation

## 2021-03-22 DIAGNOSIS — E11621 Type 2 diabetes mellitus with foot ulcer: Secondary | ICD-10-CM | POA: Insufficient documentation

## 2021-03-22 DIAGNOSIS — J9600 Acute respiratory failure, unspecified whether with hypoxia or hypercapnia: Secondary | ICD-10-CM | POA: Diagnosis not present

## 2021-03-22 DIAGNOSIS — L97412 Non-pressure chronic ulcer of right heel and midfoot with fat layer exposed: Secondary | ICD-10-CM | POA: Diagnosis not present

## 2021-03-22 DIAGNOSIS — I110 Hypertensive heart disease with heart failure: Secondary | ICD-10-CM | POA: Insufficient documentation

## 2021-03-23 ENCOUNTER — Encounter: Payer: Self-pay | Admitting: Family Medicine

## 2021-03-23 ENCOUNTER — Ambulatory Visit (INDEPENDENT_AMBULATORY_CARE_PROVIDER_SITE_OTHER): Payer: Medicare Other | Admitting: Family Medicine

## 2021-03-23 ENCOUNTER — Other Ambulatory Visit: Payer: Self-pay

## 2021-03-23 VITALS — BP 138/62 | HR 75 | Temp 97.6°F

## 2021-03-23 DIAGNOSIS — Z794 Long term (current) use of insulin: Secondary | ICD-10-CM

## 2021-03-23 DIAGNOSIS — E1142 Type 2 diabetes mellitus with diabetic polyneuropathy: Secondary | ICD-10-CM

## 2021-03-23 DIAGNOSIS — E11628 Type 2 diabetes mellitus with other skin complications: Secondary | ICD-10-CM | POA: Diagnosis not present

## 2021-03-23 LAB — CBC WITH DIFFERENTIAL/PLATELET
Basophils Absolute: 0 10*3/uL (ref 0.0–0.1)
Basophils Relative: 0.5 % (ref 0.0–3.0)
Eosinophils Absolute: 0.1 10*3/uL (ref 0.0–0.7)
Eosinophils Relative: 1.4 % (ref 0.0–5.0)
HCT: 44.8 % (ref 39.0–52.0)
Hemoglobin: 14.8 g/dL (ref 13.0–17.0)
Lymphocytes Relative: 19.4 % (ref 12.0–46.0)
Lymphs Abs: 1.7 10*3/uL (ref 0.7–4.0)
MCHC: 33 g/dL (ref 30.0–36.0)
MCV: 100.6 fl — ABNORMAL HIGH (ref 78.0–100.0)
Monocytes Absolute: 0.8 10*3/uL (ref 0.1–1.0)
Monocytes Relative: 9.1 % (ref 3.0–12.0)
Neutro Abs: 6.2 10*3/uL (ref 1.4–7.7)
Neutrophils Relative %: 69.6 % (ref 43.0–77.0)
Platelets: 181 10*3/uL (ref 150.0–400.0)
RBC: 4.45 Mil/uL (ref 4.22–5.81)
RDW: 14 % (ref 11.5–15.5)
WBC: 8.9 10*3/uL (ref 4.0–10.5)

## 2021-03-23 LAB — COMPREHENSIVE METABOLIC PANEL
ALT: 8 U/L (ref 0–53)
AST: 12 U/L (ref 0–37)
Albumin: 3.8 g/dL (ref 3.5–5.2)
Alkaline Phosphatase: 68 U/L (ref 39–117)
BUN: 41 mg/dL — ABNORMAL HIGH (ref 6–23)
CO2: 36 mEq/L — ABNORMAL HIGH (ref 19–32)
Calcium: 8.9 mg/dL (ref 8.4–10.5)
Chloride: 98 mEq/L (ref 96–112)
Creatinine, Ser: 1.8 mg/dL — ABNORMAL HIGH (ref 0.40–1.50)
GFR: 32.76 mL/min — ABNORMAL LOW (ref >60.00)
Glucose, Bld: 144 mg/dL — ABNORMAL HIGH (ref 70–99)
Potassium: 4.8 mEq/L (ref 3.5–5.1)
Sodium: 140 mEq/L (ref 135–145)
Total Bilirubin: 0.4 mg/dL (ref 0.2–1.2)
Total Protein: 6.9 g/dL (ref 6.0–8.3)

## 2021-03-23 LAB — LIPID PANEL
Cholesterol: 127 mg/dL (ref 0–200)
HDL: 45 mg/dL (ref >39.00)
LDL Cholesterol: 65 mg/dL (ref 0–99)
NonHDL: 81.87
Total CHOL/HDL Ratio: 3
Triglycerides: 83 mg/dL (ref 0.0–149.0)
VLDL: 16.6 mg/dL (ref 0.0–40.0)

## 2021-03-23 LAB — HEMOGLOBIN A1C: Hgb A1c MFr Bld: 8.3 % — ABNORMAL HIGH (ref 4.6–6.5)

## 2021-03-23 MED ORDER — INSULIN GLARGINE 100 UNIT/ML SOLOSTAR PEN: 15.0000 [IU] | PEN_INJECTOR | Freq: Every day | SUBCUTANEOUS | Status: DC

## 2021-03-23 NOTE — Patient Instructions (Signed)
Go to the lab on the way out.   If you have mychart we'll likely use that to update you.    Take care.  Glad to see you. Don't change your meds for now  Let me check with urology.

## 2021-03-23 NOTE — Progress Notes (Addendum)
This visit occurred during the SARS-CoV-2 public health emergency.  Safety protocols were in place, including screening questions prior to the visit, additional usage of staff PPE, and extensive cleaning of exam room while observing appropriate contact time as indicated for disinfecting solutions.  CT 02/21/21.  IMPRESSION:  Skin ulceration on the heel without CT evidence of osteomyelitis or abscess.  Subcutaneous edema over the dorsum of the foot could be due to  cellulitis or dependent change.  Previous imaging discussed with patient  R foot bandaged, using shoe w/o posterior backing at baseline.  He still has L thigh swelling at baseline.    Diabetes.  Still on 15 units insulin daily.  Sugar 120 this AM, usually a little lower.  Follow-up labs pending.  Less UTIs with cranberry and doxy 50mg  daily.  Compliant with suppressive therapy.  I told him I would ask urology if he needs to continue flomax.  I would prefer him to continue doxy.  Still with cath every 4 hours.    L>R hand swelling with neuropathy in hands B.  Harder to flex B 4th and 5th fingers at baseline.  Not an acute change.  He is happy about the pending birth of his great grandson, to be named Richrd Sox.  Congratulations offered.  Meds, vitals, and allergies reviewed.   ROS: Per HPI unless specifically indicated in ROS section   GEN: nad, alert and oriented, in wheelchair. HEENT: ncat NECK: supple w/o LA CV: rrr. PULM: ctab, no inc wob ABD: soft, +bs EXT: trace BLE edema SKIN: no acute rash, skin well perfused. R foot bandaged.

## 2021-03-25 ENCOUNTER — Telehealth: Payer: Self-pay | Admitting: Family Medicine

## 2021-03-25 LAB — AEROBIC CULTURE W GRAM STAIN (SUPERFICIAL SPECIMEN): Culture: NORMAL

## 2021-03-25 NOTE — Telephone Encounter (Signed)
Please check with urology to see if he needs to continue flomax.  Thanks.

## 2021-03-25 NOTE — Assessment & Plan Note (Signed)
Only current medication for diabetes right now is Lantus, 15 units a day.  Would continue as is for now.  See notes on follow-up labs.  Goal for reasonable sugar control to help with healing.  Discussed. He has follow-up pending for foot care.  We will check with urology in the meantime about whether or not he needs to continue Flomax.  Discussed.

## 2021-03-26 NOTE — Progress Notes (Signed)
[name] Small, [name] Small ([id number]) Visit Report for 03/22/2021 Arrival Information Details Patient Name: Date of Service: [name] Small, [name] Small [place] RIO N. 03/22/2021 12:30 PM Medical Record Number: [id number] Patient Account Number: [id number] Date of Birth/Sex: Treating RN: 04/13/1928 (87 y.o. [name] Primary Care : Leonides Cave Other Clinician: Referring : Treating /Extender: Earnest Conroy Weeks in Treatment: 14 Visit Information History Since Last Visit Added or deleted any medications: No Patient Arrived: Wheel Chair Any new allergies or adverse reactions: No Arrival Time: 12:42 Had a fall or experienced change in No Accompanied By: wife activities of daily living that may affect Transfer Assistance: Manual risk of falls: Patient Identification Verified: Yes Signs or symptoms of abuse/neglect since last visito No Secondary Verification Process Completed: Yes Hospitalized since last visit: No Patient Requires Transmission-Based Precautions: No Implantable device outside of the clinic excluding No Patient Has Alerts: No cellular tissue based products placed in the center since last visit: Has Dressing in Place as Prescribed: Yes Has Footwear/Offloading in Place as Prescribed: Yes Right: Wedge Shoe Pain Present Now: No Electronic Signature(s) Signed: 03/22/2021 4:36:36 PM By: Lorrin Jackson Entered By: Lorrin Jackson on 03/22/2021 12:48:22 -------------------------------------------------------------------------------- Encounter Discharge Information Details Patient Name: Date of Service: [name] Small SA RIO N. 03/22/2021 12:30 PM Medical Record Number: [id number] Patient Account Number: [id number] Date of Birth/Sex: Treating RN: 10-Aug-1928 (87 y.o. [name] Primary Care : Leonides Cave Other Clinician: Referring : Treating /Extender: Earnest Conroy Weeks in Treatment:  14 Encounter Discharge Information Items Post Procedure Vitals Discharge Condition: Stable Temperature (F): 97.8 Ambulatory Status: Wheelchair Pulse (bpm): 80 Discharge Destination: Home Respiratory Rate (breaths/min): 18 Transportation: Private Auto Blood Pressure (mmHg): 164/67 Accompanied By: spouse Schedule Follow-up Appointment: Yes Clinical Summary of Care: Patient Declined Electronic Signature(s) Signed: 03/26/2021 4:50:33 PM By: Baruch Gouty RN, BSN Entered By: Baruch Gouty on 03/22/2021 13:41:02 -------------------------------------------------------------------------------- Lower Extremity Assessment Details Patient Name: Date of Service: [name] Small SA RIO N. 03/22/2021 12:30 PM Medical Record Number: [id number] Patient Account Number: [id number] Date of Birth/Sex: Treating RN: Aug 01, 1928 (87 y.o. [name] Primary Care : Leonides Cave Other Clinician: Referring : Treating /Extender: Earnest Conroy Weeks in Treatment: 14 Edema Assessment Assessed: Shirlyn Goltz: No] Patrice Paradise: Yes] Edema: [Small: N] [Right: o] Calf Small: Right: Point of Measurement: 33 cm From Medial Instep 33 cm Ankle Small: Right: Point of Measurement: 12 cm From Medial Instep 25 cm Vascular Assessment Pulses: Dorsalis Pedis Palpable: [Right:Yes] Electronic Signature(s) Signed: 03/22/2021 4:36:36 PM By: Lorrin Jackson Entered By: Lorrin Jackson on 03/22/2021 12:55:46 -------------------------------------------------------------------------------- Multi Wound Chart Details Patient Name: Date of Service: [name] Small SA RIO N. 03/22/2021 12:30 PM Medical Record Number: [id number] Patient Account Number: [id number] Date of Birth/Sex: Treating RN: Sep 03, 1928 (87 y.o. [name], [name] Primary Care : Leonides Cave Other Clinician: Referring : Treating /Extender: Earnest Conroy Weeks in  Treatment: 14 Vital Signs Height(in): 74 Capillary Blood Glucose(mg/dl): 130 Weight(lbs): 205 Pulse(bpm): 48 Body Mass Index(BMI): 26 Blood Pressure(mmHg): 164/67 Temperature(F): 97.8 Respiratory Rate(breaths/min): 18 Photos: [1:No Photos Right Calcaneus] [N/A:N/A N/A] Wound Location: [1:Pressure Injury] [N/A:N/A] Wounding Event: [1:Diabetic Wound/Ulcer of the Lower] [N/A:N/A] Primary Etiology: [1:Extremity Pressure Ulcer] [N/A:N/A] Secondary Etiology: [1:Chronic Obstructive Pulmonary] [N/A:N/A] Comorbid History: [1:Disease (COPD), Congestive Heart Failure, Coronary Artery Disease, Hypertension, Peripheral Arterial Disease, Type II Diabetes, Neuropathy 08/11/2020] [N/A:N/A] Date Acquired: [1:14] [N/A:N/A] Weeks of Treatment: [1:Open] [N/A:N/A] Wound Status: [1:1.2x1x0.8] [N/A:N/A] Measurements L x W x D (cm) [1:0.942] [  N/A:N/A] A (cm) : rea [1:0.754] [N/A:N/A] Volume (cm) : [1:87.00%] [N/A:N/A] % Reduction in A [1:rea: -3.90%] [N/A:N/A] % Reduction in Volume: [1:2] Position 1 (o'clock): [1:1.5] Maximum Distance 1 (cm): [1:Yes] [N/A:N/A] Tunneling: [1:Grade 2] [N/A:N/A] Classification: [1:Medium] [N/A:N/A] Exudate A mount: [1:Serosanguineous] [N/A:N/A] Exudate Type: [1:red, brown] [N/A:N/A] Exudate Color: [1:Well defined, not attached] [N/A:N/A] Wound Margin: [1:Large (67-100%)] [N/A:N/A] Granulation A mount: [1:Pink, Pale] [N/A:N/A] Granulation Quality: [1:Small (1-33%)] [N/A:N/A] Necrotic A mount: [1:Fat Layer (Subcutaneous Tissue): Yes N/A] Exposed Structures: [1:Fascia: No Tendon: No Muscle: No Joint: No Bone: No Medium (34-66%)] [N/A:N/A] Epithelialization: [1:Debridement - Excisional] [N/A:N/A] Debridement: Pre-procedure Verification/Time Out 13:15 [N/A:N/A] Taken: [1:Lidocaine] [N/A:N/A] Pain Control: [1:Callus, Subcutaneous, Slough] [N/A:N/A] Tissue Debrided: [1:Skin/Subcutaneous Tissue] [N/A:N/A] Level: [1:1.2] [N/A:N/A] Debridement A (sq cm): [1:rea Curette]  [N/A:N/A] Instrument: [1:Moderate] [N/A:N/A] Bleeding: [1:Silver Nitrate] [N/A:N/A] Hemostasis A chieved: [1:0] [N/A:N/A] Procedural Pain: [1:0] [N/A:N/A] Post Procedural Pain: [1:Procedure was tolerated well] [N/A:N/A] Debridement Treatment Response: [1:1.2x1x0.8] [N/A:N/A] Post Debridement Measurements L x W x D (cm) [1:0.754] [N/A:N/A] Post Debridement Volume: (cm) [1:Debridement] [N/A:N/A] Treatment Notes Electronic Signature(s) Signed: 03/22/2021 3:44:17 PM By: Linton Ham MD Signed: 03/26/2021 6:08:57 PM By: Rhae Hammock RN Entered By: Linton Ham on 03/22/2021 13:20:21 -------------------------------------------------------------------------------- Multi-Disciplinary Care Plan Details Patient Name: Date of Service: [name] Small SA RIO N. 03/22/2021 12:30 PM Medical Record Number: [id number] Patient Account Number: [id number] Date of Birth/Sex: Treating RN: 1928-10-22 (87 y.o. [name], [place] Primary Care : Leonides Cave Other Clinician: Referring : Treating /Extender: Veneta Penton in Treatment: 14 Multidisciplinary Care Plan reviewed with physician Active Inactive Abuse / Safety / Falls / Self Care Management Nursing Diagnoses: History of Falls Impaired physical mobility Potential for injury related to falls Goals: Patient will not experience any injury related to falls Date Initiated: 12/11/2020 Target Resolution Date: 04/06/2021 Goal Status: Active Patient/caregiver will verbalize/demonstrate measures taken to prevent injury and/or falls Date Initiated: 12/11/2020 Date Inactivated: 02/06/2021 Target Resolution Date: 01/25/2021 Goal Status: Met Interventions: Assess Activities of Daily Living upon admission and as needed Assess fall risk on admission and as needed Assess: immobility, friction, shearing, incontinence upon admission and as needed Assess impairment of mobility on admission and as  needed per policy Patient/Caregiver referred to community resources (specify in notes) Provide education on fall prevention Provide education on personal and home safety Notes: Nutrition Nursing Diagnoses: Impaired glucose control: actual or potential Potential for alteratiion in Nutrition/Potential for imbalanced nutrition Goals: Patient/caregiver agrees to and verbalizes understanding of need to use nutritional supplements and/or vitamins as prescribed Date Initiated: 12/11/2020 Date Inactivated: 02/15/2021 Target Resolution Date: 03/09/2021 Goal Status: Met Patient/caregiver will maintain therapeutic glucose control Date Initiated: 12/11/2020 Target Resolution Date: 04/06/2021 Goal Status: Active Interventions: Assess HgA1c results as ordered upon admission and as needed Assess patient nutrition upon admission and as needed per policy Provide education on elevated blood sugars and impact on wound healing Provide education on nutrition Treatment Activities: Education provided on Nutrition : 03/08/2021 Notes: Pressure Nursing Diagnoses: Knowledge deficit related to causes and risk factors for pressure ulcer development Knowledge deficit related to management of pressures ulcers Potential for impaired tissue integrity related to pressure, friction, moisture, and shear Goals: Patient/caregiver will verbalize risk factors for pressure ulcer development Date Initiated: 12/11/2020 Target Resolution Date: 04/06/2021 Goal Status: Active Patient/caregiver will verbalize understanding of pressure ulcer management Date Initiated: 12/11/2020 Date Inactivated: 02/15/2021 Target Resolution Date: 02/25/2021 Goal Status: Met Interventions: Assess: immobility, friction, shearing, incontinence upon admission and as needed Assess offloading mechanisms upon  admission and as needed Assess potential for pressure ulcer upon admission and as needed Provide education on pressure ulcers Notes: Electronic  Signature(s) Signed: 03/26/2021 6:08:57 PM By: Rhae Hammock RN Entered By: Rhae Hammock on 03/22/2021 13:17:40 -------------------------------------------------------------------------------- Pain Assessment Details Patient Name: Date of Service: [name] Small SA RIO N. 03/22/2021 12:30 PM Medical Record Number: [id number] Patient Account Number: [id number] Date of Birth/Sex: Treating RN: 26-Oct-1928 (87 y.o. [name] Primary Care Tressy Kunzman: Leonides Cave Other Clinician: Referring Parker Sawatzky: Treating Ruhi Kopke/Extender: Earnest Conroy Weeks in Treatment: 14 Active Problems Location of Pain Severity and Description of Pain Patient Has Paino No Site Locations Pain Management and Medication Current Pain Management: Electronic Signature(s) Signed: 03/22/2021 4:36:36 PM By: Lorrin Jackson Entered By: Lorrin Jackson on 03/22/2021 12:49:09 -------------------------------------------------------------------------------- Patient/Caregiver Education Details Patient Name: Date of Service: Enid Baas 5/12/2022andnbsp12:30 PM Medical Record Number: [id number] Patient Account Number: [id number] Date of Birth/Gender: Treating RN: 1927-12-10 (87 y.o. [name], [name] Primary Care Physician: Leonides Cave Other Clinician: Referring Physician: Treating Physician/Extender: Veneta Penton in Treatment: 14 Education Assessment Education Provided To: Patient Education Topics Provided Nutrition: Methods: Explain/Verbal Responses: State content correctly Electronic Signature(s) Signed: 03/26/2021 6:08:57 PM By: Rhae Hammock RN Entered By: Rhae Hammock on 03/22/2021 13:17:54 -------------------------------------------------------------------------------- Wound Assessment Details Patient Name: Date of Service: [name] Small SA RIO N. 03/22/2021 12:30 PM Medical Record Number: [id number] Patient Account  Number: [id number] Date of Birth/Sex: Treating RN: 03-Aug-1928 (87 y.o. [name] Primary Care Alfretta Pinch: Leonides Cave Other Clinician: Referring Trevaughn Schear: Treating Nyree Yonker/Extender: Earnest Conroy Weeks in Treatment: 14 Wound Status Wound Number: 1 Primary Diabetic Wound/Ulcer of the Lower Extremity Etiology: Wound Location: Right Calcaneus Secondary Pressure Ulcer Wounding Event: Pressure Injury Etiology: Date Acquired: 08/11/2020 Wound Open Weeks Of Treatment: 14 Status: Clustered Wound: No Comorbid Chronic Obstructive Pulmonary Disease (COPD), Congestive History: Heart Failure, Coronary Artery Disease, Hypertension, Peripheral Arterial Disease, Type II Diabetes, Neuropathy Photos Wound Measurements Length: (cm) 1.2 Width: (cm) 1 Depth: (cm) 0.8 Area: (cm) 0.942 Volume: (cm) 0.754 % Reduction in Area: 87% % Reduction in Volume: -3.9% Epithelialization: Medium (34-66%) Tunneling: Yes Position (o'clock): 2 Maximum Distance: (cm) 1.5 Undermining: No Wound Description Classification: Grade 2 Wound Margin: Well defined, not attached Exudate Amount: Medium Exudate Type: Serosanguineous Exudate Color: red, brown Foul Odor After Cleansing: No Slough/Fibrino Yes Wound Bed Granulation Amount: Large (67-100%) Exposed Structure Granulation Quality: Pink, Pale Fascia Exposed: No Necrotic Amount: Small (1-33%) Fat Layer (Subcutaneous Tissue) Exposed: Yes Necrotic Quality: Adherent Slough Tendon Exposed: No Muscle Exposed: No Joint Exposed: No Bone Exposed: No Treatment Notes Wound #1 (Calcaneus) Wound Laterality: Right Cleanser Wound Cleanser Discharge Instruction: Cleanse the wound with wound cleanser or normal saline prior to applying a clean dressing using gauze sponges, not tissue or cotton balls. Peri-Wound Care Topical Primary Dressing Promogran Prisma Matrix, 4.34 (sq in) (silver collagen) Discharge Instruction:  Moisten collagen with saline or hydrogel Secondary Dressing Woven Gauze Sponge, Non-Sterile 4x4 in Discharge Instruction: wet to dry packing the Prisma. Apply over primary dressing as directed. ALLEVYN Heel 4 1/2in x 5 1/2in / 10.5cm x 13.5cm Discharge Instruction: Apply over primary dressing as directed. Secured With [employer], 4.5x3.1 (in/yd) Discharge Instruction: Secure with Kerlix as directed. Paper Tape, 2x10 (in/yd) Discharge Instruction: Secure dressing with tape as directed. Compression Wrap Compression Stockings Add-Ons Electronic Signature(s) Signed: 03/22/2021 3:37:13 PM By: Sandre Kitty Signed: 03/22/2021 4:36:36 PM By: Lorrin Jackson Entered By: Rudean Curt,  Destiny on 03/22/2021 15:05:57 -------------------------------------------------------------------------------- Vitals Details Patient Name: Date of Service: NICKOLAS, CHALFIN [place] RIO N. 03/22/2021 12:30 PM Medical Record Number: [id number] Patient Account Number: [id number] Date of Birth/Sex: Treating RN: [birth year] (87 y.o. [name] Primary Care Delonte Musich: Leonides Cave Other Clinician: Referring Safiyah Cisney: Treating Donata Reddick/Extender: Earnest Conroy Weeks in Treatment: 14 Vital Signs Time Taken: 12:48 Temperature (F): 97.8 Height (in): 74 Pulse (bpm): 80 Weight (lbs): 205 Respiratory Rate (breaths/min): 18 Body Mass Index (BMI): 26.3 Blood Pressure (mmHg): 164/67 Capillary Blood Glucose (mg/dl): 130 Reference Range: 80 - 120 mg / dl Notes Glucose per patient report Electronic Signature(s) Signed: 03/22/2021 4:36:36 PM By: Lorrin Jackson Entered By: Lorrin Jackson on 03/22/2021 12:49:03

## 2021-03-26 NOTE — Telephone Encounter (Signed)
[facility] (Dr. Louis Meckel) L/m on [place] voice mail with questions and contact information to let our office know.

## 2021-03-26 NOTE — Progress Notes (Signed)
[name], Small ([id number]) Visit Report for 03/22/2021 Debridement Details Patient Name: Date of Service: [name] Small, [name] Small Galva RIO N. 03/22/2021 12:30 PM Medical Record Number: [id number] Patient Account Number: [id number] Date of Birth/Sex: Treating RN: Sep 25, 1928 (87 y.o. Burnadette Small, [name] Primary Care Provider: Leonides Cave Other Clinician: Referring Provider: Treating Provider/Extender: Earnest Conroy Weeks in Treatment: 14 Debridement Performed for Assessment: Wound #1 Right Calcaneus Performed By: Physician Ricard Dillon., MD Debridement Type: Debridement Severity of Tissue Pre Debridement: Fat layer exposed Level of Consciousness (Pre-procedure): Awake and Alert Pre-procedure Verification/Time Out Yes - 13:15 Taken: Start Time: 13:15 Pain Control: Lidocaine T Area Debrided (L x W): otal 1.2 (cm) x 1 (cm) = 1.2 (cm) Tissue and other material debrided: Viable, Non-Viable, Callus, Slough, Subcutaneous, Skin: Dermis , Skin: Epidermis, Slough Level: Skin/Subcutaneous Tissue Debridement Description: Excisional Instrument: Curette Bleeding: Moderate Hemostasis Achieved: Silver Nitrate End Time: 13:16 Procedural Pain: 0 Post Procedural Pain: 0 Response to Treatment: Procedure was tolerated well Level of Consciousness (Post- Awake and Alert procedure): Post Debridement Measurements of Total Wound Length: (cm) 1.2 Width: (cm) 1 Depth: (cm) 0.8 Volume: (cm) 0.754 Character of Wound/Ulcer Post Debridement: Improved Severity of Tissue Post Debridement: Fat layer exposed Post Procedure Diagnosis Same as Pre-procedure Electronic Signature(s) Signed: 03/22/2021 3:44:17 PM By: Linton Ham MD Signed: 03/26/2021 6:08:57 PM By: Rhae Hammock RN Entered By: Linton Ham on 03/22/2021 13:20:31 -------------------------------------------------------------------------------- HPI Details Patient Name: Date of Service: [name]  03/22/2021 12:30 PM Medical Record Number: [id number] Patient Account Number: [id number] Date of Birth/Sex: Treating RN: 12/06/1927 (87 y.o. Burnadette Small, [name] Small Primary Care Provider: Leonides Cave Other Clinician: Referring Provider: Treating Provider/Extender: Earnest Conroy Weeks in Treatment: 14 History of Present Illness HPI Description: ADMISSION 12/11/2020 This is a 87 year old man who apparently fractured his left hip in June and then had a subsequent fall and femur fracture requiring a separate surgery. He spent a long time at rehab at [facility] skilled facility. Sometime in October his wife noted a blister on the left heel that is gradually morphed into a pressure ulcer. He saw his primary physician on 11/07/2020 and he was referred here. The patient had prior problems with PAD on the left. His last arterial studies in September 2020 showed an ABI on the right of 1.03 with a TBI of 0.73 and biphasic waveforms on the left a ABI of 0.84, TBI's were not done because the toe was surgically absent. He had monophasic and biphasic waveforms. In 2017 he had an angiogram showing a mom diffusely diseased SFA and occluded left popliteal he underwent a left popliteal and left peroneal artery angioplasty. Noteworthy on the LEFT the peroneal was the only patent vessel below the knee. I do not think his right side was actually studied Past medical history; type 2 diabetes on insulin with peripheral neuropathy, hypertension, hyperlipidemia, hip fracture and subsequent femur fracture is noted, and emphysema, history of bladder cancer requiring in and out catheterizations, coronary artery disease, BPH. Updated ABI in our clinic was 0.93 on the right 2/14 Santyl to continue. Mechanical debridement 2/21; using Santyl. Still requiring mechanical debridement today which was disappointing. 3/15; 3-week follow-up. Using Santyl but still requiring mechanical debridements in the same  spot in this wound. Most of the rest of the surface of this wound looks satisfactory however in the most proximal part filled with mostly necrotic tissue. 3/29; 2-week follow-up. They have been using Santyl the patient's wife is changing the  dressing as well as home health. Culture I did showed both Enterococcus and Klebsiella he was treated with 7 days of Augmentin but I am going to extend this. X-ray was negative for osteomyelitis 4/7; weekly follow-up. I changed him to silver collagen last week no real improvement here. He has completed the Augmentin. He still has really 2 wounds part of it filled then but a deeper part that is of probing area right to the bone itself or at least close to it. A lot of necrotic debris continues in this area. We ran Dermagraft the last time he was here however it is being discontinued. Moreover in consideration of a skin substitute, we would need to have something that could fold and be molded into the wound itself 4/14; weekly follow-up. Silver collagen. I am attempting to get epi fix to try and close down the superior divot. CT scan of this area did not show underlying osteomyelitis. In fact they mention that the wound does not go to bone. Certainly felt like it did clinically however. Unfortunately to get epi fix we are going to need a hemoglobin A1c. His wife states they just had lab work done at the [place] and we will see if we can get a copy of the hemoglobin A1c. The patient says it was 8.1 4/28; patient presents for 2-week follow-up. He has been using silver collagen every other day to the right calcaneus. He has no complaints or issues today. He denies any fever/chills, purulent drainage, erythema or increased warmth to the foot 5/12; 2-week follow-up. She is using silver collagen. Epi fix has a $287 per application co-pay or thereabouts. Arrived in clinic with purulent drainage coming from the divot at 2:00 Electronic Signature(s) Signed: 03/22/2021 3:44:17 PM  By: Linton Ham MD Entered By: Linton Ham on 03/22/2021 13:21:22 -------------------------------------------------------------------------------- Physical Exam Details Patient Name: Date of Service: [name] 03/22/2021 12:30 PM Medical Record Number: [id number] Patient Account Number: [id number] Date of Birth/Sex: Treating RN: 02/18/1928 (87 y.o. Burnadette Small, [name] Primary Care Provider: Leonides Cave Other Clinician: Referring Provider: Treating Provider/Extender: Earnest Conroy Weeks in Treatment: 14 Constitutional Patient is hypertensive.. Pulse regular and within target range for patient.Marland Kitchen Respirations regular, non-labored and within target range.. Temperature is normal and within the target range for the patient.Marland Kitchen Appears in no distress. Notes Wound exam; right foot calcaneus. The tunneling part of this that was the 2:00 is a lot deeper wide open there is no tissue adherence here. I used a #5 curette to remove skin and subcutaneous tissue from that area. Hemostasis with silver nitrate and a pressure dressing. Some drainage also coming from this area which I cultured but there was no obvious infection Electronic Signature(s) Signed: 03/22/2021 3:44:17 PM By: Linton Ham MD Entered By: Linton Ham on 03/22/2021 13:22:19 -------------------------------------------------------------------------------- Physician Orders Details Patient Name: Date of Service: [name] 03/22/2021 12:30 PM Medical Record Number: [id number] Patient Account Number: [id number] Date of Birth/Sex: Treating RN: 1928/07/04 (87 y.o. Burnadette Small, [name] Primary Care Provider: Leonides Cave Other Clinician: Referring Provider: Treating Provider/Extender: Earnest Conroy Weeks in Treatment: 14 Verbal / Phone Orders: No Diagnosis Coding Follow-up Appointments ppointment in 2 weeks. - Dr. Dellia Nims Return A Cellular or  Tissue Based Products Cellular or Tissue Based Product Type: - Epifix has been approved by insurance. Will order when wound is ready. Bathing/ Shower/ Hygiene May shower and wash wound with soap and water. - prior to  dressing change Edema Control - Lymphedema / SCD / Other Elevate legs to the level of the heart or above for 30 minutes daily and/or when sitting, a frequency of: Avoid standing for long periods of time. Off-Loading Heel suspension boot to: - Gloped heel offloading sandal to right foot Turn and reposition every 2 hours Other: - float heels off of bed and chair by placing pillow under calves [facility] wound care orders this week; continue [facility] for wound care. May utilize formulary equivalent dressing for wound treatment orders unless otherwise specified. - [facility] for wound care 2-3x a week. Wound Treatment Wound #1 - Calcaneus Wound Laterality: Right Cleanser: Wound Cleanser (Home Health) 1 x Per Day/15 Days Discharge Instructions: Cleanse the wound with wound cleanser or normal saline prior to applying a clean dressing using gauze sponges, not tissue or cotton balls. Prim Dressing: Promogran Prisma Matrix, 4.34 (sq in) (silver collagen) (Home Health) 1 x Per Day/15 Days ary Discharge Instructions: Moisten collagen with saline or hydrogel Secondary Dressing: Woven Gauze Sponge, Non-Sterile 4x4 in (Home Health) (Generic) 1 x Per Day/15 Days Discharge Instructions: wet to dry packing the Prisma. Apply over primary dressing as directed. Secondary Dressing: ALLEVYN Heel 4 1/2in x 5 1/2in / 10.5cm x 13.5cm (Home Health) (Generic) 1 x Per Day/15 Days Discharge Instructions: Apply over primary dressing as directed. Secured With: [employer], 4.5x3.1 (in/yd) (Home Health) (Generic) 1 x Per Day/15 Days Discharge Instructions: Secure with Kerlix as directed. Secured With: Paper Tape, 2x10 (in/yd) (Home Health) (Generic) 1 x Per Day/15 Days Discharge Instructions:  Secure dressing with tape as directed. Laboratory naerobe culture (MICRO) - Right heel cultured. We will call you in 5-7 days once culture comes Bacteria identified in Unspecified specimen by A back. LOINC Code: 595-6 Convenience Name: Anerobic culture Electronic Signature(s) Signed: 03/22/2021 3:44:17 PM By: Linton Ham MD Signed: 03/26/2021 6:08:57 PM By: Rhae Hammock RN Entered By: Rhae Hammock on 03/22/2021 13:17:30 -------------------------------------------------------------------------------- Problem List Details Patient Name: Date of Service: [name] 03/22/2021 12:30 PM Medical Record Number: [id number] Patient Account Number: [id number] Date of Birth/Sex: Treating RN: [birth year] (87 y.o. Burnadette Small, [name] Primary Care Provider: Leonides Cave Other Clinician: Referring Provider: Treating Provider/Extender: Lina Sayre, Purcell Mouton Weeks in Treatment: 14 Active Problems ICD-10 Encounter Code Description Active Date MDM Diagnosis E11.621 Type 2 diabetes mellitus with foot ulcer 12/11/2020 No Yes L97.418 Non-pressure chronic ulcer of right heel and midfoot with other specified 12/11/2020 No Yes severity E11.51 Type 2 diabetes mellitus with diabetic peripheral angiopathy without gangrene 12/11/2020 No Yes L03.115 Cellulitis of right lower limb 02/06/2021 No Yes E11.42 Type 2 diabetes mellitus with diabetic polyneuropathy 12/11/2020 No Yes Inactive Problems Resolved Problems Electronic Signature(s) Signed: 03/22/2021 3:44:17 PM By: Linton Ham MD Entered By: Linton Ham on 03/22/2021 13:20:14 -------------------------------------------------------------------------------- Progress Note Details Patient Name: Date of Service: [name] 03/22/2021 12:30 PM Medical Record Number: [id number] Patient Account Number: [id number] Date of Birth/Sex: Treating RN: 1928/04/16 (87 y.o. Burnadette Small, Olmitz Primary Care Provider:  Leonides Cave Other Clinician: Referring Provider: Treating Provider/Extender: Earnest Conroy Weeks in Treatment: 14 Subjective History of Present Illness (HPI) ADMISSION 12/11/2020 This is a 87 year old man who apparently fractured his left hip in June and then had a subsequent fall and femur fracture requiring a separate surgery. He spent a long time at rehab at [facility] skilled facility. Sometime in October his wife noted a blister on the left heel  that is gradually morphed into a pressure ulcer. He saw his primary physician on 11/07/2020 and he was referred here. The patient had prior problems with PAD on the left. His last arterial studies in September 2020 showed an ABI on the right of 1.03 with a TBI of 0.73 and biphasic waveforms on the left a ABI of 0.84, TBI's were not done because the toe was surgically absent. He had monophasic and biphasic waveforms. In 2017 he had an angiogram showing a mom diffusely diseased SFA and occluded left popliteal he underwent a left popliteal and left peroneal artery angioplasty. Noteworthy on the LEFT the peroneal was the only patent vessel below the knee. I do not think his right side was actually studied Past medical history; type 2 diabetes on insulin with peripheral neuropathy, hypertension, hyperlipidemia, hip fracture and subsequent femur fracture is noted, and emphysema, history of bladder cancer requiring in and out catheterizations, coronary artery disease, BPH. Updated ABI in our clinic was 0.93 on the right 2/14 Santyl to continue. Mechanical debridement 2/21; using Santyl. Still requiring mechanical debridement today which was disappointing. 3/15; 3-week follow-up. Using Santyl but still requiring mechanical debridements in the same spot in this wound. Most of the rest of the surface of this wound looks satisfactory however in the most proximal part filled with mostly necrotic tissue. 3/29; 2-week follow-up. They  have been using Santyl the patient's wife is changing the dressing as well as home health. Culture I did showed both Enterococcus and Klebsiella he was treated with 7 days of Augmentin but I am going to extend this. X-ray was negative for osteomyelitis 4/7; weekly follow-up. I changed him to silver collagen last week no real improvement here. He has completed the Augmentin. He still has really 2 wounds part of it filled then but a deeper part that is of probing area right to the bone itself or at least close to it. A lot of necrotic debris continues in this area. We ran Dermagraft the last time he was here however it is being discontinued. Moreover in consideration of a skin substitute, we would need to have something that could fold and be molded into the wound itself 4/14; weekly follow-up. Silver collagen. I am attempting to get epi fix to try and close down the superior divot. CT scan of this area did not show underlying osteomyelitis. In fact they mention that the wound does not go to bone. Certainly felt like it did clinically however. Unfortunately to get epi fix we are going to need a hemoglobin A1c. His wife states they just had lab work done at the [place] and we will see if we can get a copy of the hemoglobin A1c. The patient says it was 8.1 4/28; patient presents for 2-week follow-up. He has been using silver collagen every other day to the right calcaneus. He has no complaints or issues today. He denies any fever/chills, purulent drainage, erythema or increased warmth to the foot 5/12; 2-week follow-up. She is using silver collagen. Epi fix has a $355 per application co-pay or thereabouts. Arrived in clinic with purulent drainage coming from the divot at 2:00 Objective Constitutional Patient is hypertensive.. Pulse regular and within target range for patient.Marland Kitchen Respirations regular, non-labored and within target range.. Temperature is normal and within the target range for the patient.Marland Kitchen  Appears in no distress. Vitals Time Taken: 12:48 PM, Height: 74 in, Weight: 205 lbs, BMI: 26.3, Temperature: 97.8 F, Pulse: 80 bpm, Respiratory Rate: 18 breaths/min, Blood Pressure: 164/67  mmHg, Capillary Blood Glucose: 130 mg/dl. General Notes: Glucose per patient report General Notes: Wound exam; right foot calcaneus. The tunneling part of this that was the 2:00 is a lot deeper wide open there is no tissue adherence here. I used a #5 curette to remove skin and subcutaneous tissue from that area. Hemostasis with silver nitrate and a pressure dressing. Some drainage also coming from this area which I cultured but there was no obvious infection Integumentary (Hair, Skin) Wound #1 status is Open. Original cause of wound was Pressure Injury. The date acquired was: 08/11/2020. The wound has been in treatment 14 weeks. The wound is located on the Right Calcaneus. The wound measures 1.2cm length x 1cm width x 0.8cm depth; 0.942cm^2 area and 0.754cm^3 volume. There is Fat Layer (Subcutaneous Tissue) exposed. There is no undermining noted, however, there is tunneling at 2:00 with a maximum distance of 1.5cm. There is a medium amount of serosanguineous drainage noted. The wound margin is well defined and not attached to the wound base. There is large (67-100%) pink, pale granulation within the wound bed. There is a small (1-33%) amount of necrotic tissue within the wound bed including Adherent Slough. Assessment Active Problems ICD-10 Type 2 diabetes mellitus with foot ulcer Non-pressure chronic ulcer of right heel and midfoot with other specified severity Type 2 diabetes mellitus with diabetic peripheral angiopathy without gangrene Cellulitis of right lower limb Type 2 diabetes mellitus with diabetic polyneuropathy Procedures Wound #1 Pre-procedure diagnosis of Wound #1 is a Diabetic Wound/Ulcer of the Lower Extremity located on the Right Calcaneus .Severity of Tissue Pre Debridement is: Fat layer  exposed. There was a Excisional Skin/Subcutaneous Tissue Debridement with a total area of 1.2 sq cm performed by Ricard Dillon., MD. With the following instrument(s): Curette to remove Viable and Non-Viable tissue/material. Material removed includes Callus, Subcutaneous Tissue, Slough, Skin: Dermis, and Skin: Epidermis after achieving pain control using Lidocaine. A time out was conducted at 13:15, prior to the start of the procedure. A Moderate amount of bleeding was controlled with Silver Nitrate. The procedure was tolerated well with a pain level of 0 throughout and a pain level of 0 following the procedure. Post Debridement Measurements: 1.2cm length x 1cm width x 0.8cm depth; 0.754cm^3 volume. Character of Wound/Ulcer Post Debridement is improved. Severity of Tissue Post Debridement is: Fat layer exposed. Post procedure Diagnosis Wound #1: Same as Pre-Procedure Plan Follow-up Appointments: Return Appointment in 2 weeks. - Dr. Dellia Nims Cellular or Tissue Based Products: Cellular or Tissue Based Product Type: - Epifix has been approved by insurance. Will order when wound is ready. Bathing/ Shower/ Hygiene: May shower and wash wound with soap and water. - prior to dressing change Edema Control - Lymphedema / SCD / Other: Elevate legs to the level of the heart or above for 30 minutes daily and/or when sitting, a frequency of: Avoid standing for long periods of time. Off-Loading: Heel suspension boot to: - Gloped heel offloading sandal to right foot Turn and reposition every 2 hours Other: - float heels off of bed and chair by placing pillow under calves Home Health: New wound care orders this week; continue Home Health for wound care. May utilize formulary equivalent dressing for wound treatment orders unless otherwise specified. - [facility] for wound care 2-3x a week. Laboratory ordered were: Anerobic culture - Right heel cultured. We will call you in 5-7 days once culture comes  back. WOUND #1: - Calcaneus Wound Laterality: Right Cleanser: Wound Cleanser (Home Health) 1 x  Per Day/15 Days Discharge Instructions: Cleanse the wound with wound cleanser or normal saline prior to applying a clean dressing using gauze sponges, not tissue or cotton balls. Prim Dressing: Promogran Prisma Matrix, 4.34 (sq in) (silver collagen) (Home Health) 1 x Per Day/15 Days ary Discharge Instructions: Moisten collagen with saline or hydrogel Secondary Dressing: Woven Gauze Sponge, Non-Sterile 4x4 in (Home Health) (Generic) 1 x Per Day/15 Days Discharge Instructions: wet to dry packing the Prisma. Apply over primary dressing as directed. Secondary Dressing: ALLEVYN Heel 4 1/2in x 5 1/2in / 10.5cm x 13.5cm (Home Health) (Generic) 1 x Per Day/15 Days Discharge Instructions: Apply over primary dressing as directed. Secured With: [employer], 4.5x3.1 (in/yd) (Home Health) (Generic) 1 x Per Day/15 Days Discharge Instructions: Secure with Kerlix as directed. Secured With: Paper T ape, 2x10 (in/yd) (Home Health) (Generic) 1 x Per Day/15 Days Discharge Instructions: Secure dressing with tape as directed. 1. Silver collagen 2. Extensive debridement today to remove overhanging tunneling area. I do not think we would be able to heal this the way it was. 3. Culture done but no empiric antibiotics previous CT scan was negative for osteomyelitis. 4. I would like to see this next week afterwards we can get them back on their schedule. 5. Unaffordable epi fix co-pay Electronic Signature(s) Signed: 03/22/2021 3:44:17 PM By: Linton Ham MD Entered By: Linton Ham on 03/22/2021 13:26:12 -------------------------------------------------------------------------------- SuperBill Details Patient Name: Date of Service: [name] 03/22/2021 Medical Record Number: [id number] Patient Account Number: [id number] Date of Birth/Sex: Treating RN: Nov 21, 1927 (87 y.o. Burnadette Small,  [name] Primary Care Provider: Leonides Cave Other Clinician: Referring Provider: Treating Provider/Extender: Earnest Conroy Weeks in Treatment: 14 Diagnosis Coding ICD-10 Codes Code Description E11.621 Type 2 diabetes mellitus with foot ulcer L97.418 Non-pressure chronic ulcer of right heel and midfoot with other specified severity E11.51 Type 2 diabetes mellitus with diabetic peripheral angiopathy without gangrene L03.115 Cellulitis of right lower limb E11.42 Type 2 diabetes mellitus with diabetic polyneuropathy Facility Procedures CPT4 Code: [id number] Description: 08657 - DEB SUBQ TISSUE 20 SQ CM/< ICD-10 Diagnosis Description L97.418 Non-pressure chronic ulcer of right heel and midfoot with other specified seve Modifier: rity Quantity: 1 Physician Procedures : CPT4 Code Description Modifier [id number] - WC PHYS SUBQ TISS 20 SQ CM ICD-10 Diagnosis Description L97.418 Non-pressure chronic ulcer of right heel and midfoot with other specified severity Quantity: 1 Electronic Signature(s) Signed: 03/22/2021 3:44:17 PM By: Linton Ham MD Entered By: Linton Ham on 03/22/2021 13:26:23

## 2021-03-30 ENCOUNTER — Other Ambulatory Visit: Payer: Self-pay

## 2021-03-30 ENCOUNTER — Encounter (HOSPITAL_BASED_OUTPATIENT_CLINIC_OR_DEPARTMENT_OTHER): Payer: Medicare Other | Admitting: Internal Medicine

## 2021-03-30 DIAGNOSIS — L97412 Non-pressure chronic ulcer of right heel and midfoot with fat layer exposed: Secondary | ICD-10-CM | POA: Diagnosis not present

## 2021-03-30 DIAGNOSIS — I110 Hypertensive heart disease with heart failure: Secondary | ICD-10-CM | POA: Diagnosis not present

## 2021-03-30 DIAGNOSIS — E1151 Type 2 diabetes mellitus with diabetic peripheral angiopathy without gangrene: Secondary | ICD-10-CM | POA: Diagnosis not present

## 2021-03-30 DIAGNOSIS — E11621 Type 2 diabetes mellitus with foot ulcer: Secondary | ICD-10-CM | POA: Diagnosis not present

## 2021-03-30 DIAGNOSIS — I509 Heart failure, unspecified: Secondary | ICD-10-CM | POA: Diagnosis not present

## 2021-03-30 DIAGNOSIS — Z794 Long term (current) use of insulin: Secondary | ICD-10-CM | POA: Diagnosis not present

## 2021-03-30 DIAGNOSIS — E1142 Type 2 diabetes mellitus with diabetic polyneuropathy: Secondary | ICD-10-CM | POA: Diagnosis not present

## 2021-03-30 DIAGNOSIS — I2510 Atherosclerotic heart disease of native coronary artery without angina pectoris: Secondary | ICD-10-CM | POA: Diagnosis not present

## 2021-03-30 DIAGNOSIS — L03115 Cellulitis of right lower limb: Secondary | ICD-10-CM | POA: Diagnosis not present

## 2021-03-30 DIAGNOSIS — L97418 Non-pressure chronic ulcer of right heel and midfoot with other specified severity: Secondary | ICD-10-CM | POA: Diagnosis not present

## 2021-03-30 NOTE — Progress Notes (Signed)
[name] Small, [name] Small ([id number]) Visit Report for 03/30/2021 Debridement Details Patient Name: Date of Service: [name] Small, [name] Small 03/30/2021 12:30 PM Medical Record Number: [id number] Patient Account Number: [id number] Date of Birth/Sex: Treating RN: 26-Dec-1927 (87 y.o. [name] Small Primary Care Provider: Leonides Cave Other Clinician: Referring Provider: Treating Provider/Extender: Earnest Conroy Weeks in Treatment: 15 Debridement Performed for Assessment: Wound #1 Right Calcaneus Performed By: Physician Ricard Dillon., MD Debridement Type: Debridement Severity of Tissue Pre Debridement: Fat layer exposed Level of Consciousness (Pre-procedure): Awake and Alert Pre-procedure Verification/Time Out Yes - 13:06 Taken: Start Time: 13:07 Pain Control: Other : Benzocaine T Area Debrided (L x W): otal 1.5 (cm) x 1 (cm) = 1.5 (cm) Tissue and other material debrided: Non-Viable, Subcutaneous Level: Skin/Subcutaneous Tissue Debridement Description: Excisional Instrument: Curette Bleeding: Minimum Hemostasis Achieved: Pressure End Time: 13:12 Response to Treatment: Procedure was tolerated well Level of Consciousness (Post- Awake and Alert procedure): Post Debridement Measurements of Total Wound Length: (cm) 1.5 Width: (cm) 1 Depth: (cm) 1.1 Volume: (cm) 1.296 Character of Wound/Ulcer Post Debridement: Stable Severity of Tissue Post Debridement: Fat layer exposed Post Procedure Diagnosis Same as Pre-procedure Electronic Signature(s) Signed: 03/30/2021 4:33:12 PM By: Linton Ham MD Signed: 03/30/2021 5:20:22 PM By: Lorrin Jackson Entered By: Linton Ham on 03/30/2021 13:16:54 -------------------------------------------------------------------------------- HPI Details Patient Name: Date of Service: [name] [name] 03/30/2021 12:30 PM Medical Record Number: [id number] Patient Account Number: [id number] Date of Birth/Sex: Treating  RN: 1928/02/18 (87 y.o. [name] Small Primary Care Provider: Leonides Cave Other Clinician: Referring Provider: Treating Provider/Extender: Earnest Conroy Weeks in Treatment: 15 History of Present Illness HPI Description: ADMISSION 12/11/2020 This is a 87 year old man who apparently fractured his left hip in June and then had a subsequent fall and femur fracture requiring a separate surgery. He spent a long time at rehab at [place] skilled facility. Sometime in October his wife noted a blister on the left heel that is gradually morphed into a pressure ulcer. He saw his primary physician on 11/07/2020 and he was referred here. The patient had prior problems with PAD on the left. His last arterial studies in September 2020 showed an ABI on the right of 1.03 with a TBI of 0.73 and biphasic waveforms on the left a ABI of 0.84, TBI's were not done because the toe was surgically absent. He had monophasic and biphasic waveforms. In 2017 he had an angiogram showing a mom diffusely diseased SFA and occluded left popliteal he underwent a left popliteal and left peroneal artery angioplasty. Noteworthy on the LEFT the peroneal was the only patent vessel below the knee. I do not think his right side was actually studied Past medical history; type 2 diabetes on insulin with peripheral neuropathy, hypertension, hyperlipidemia, hip fracture and subsequent femur fracture is noted, and emphysema, history of bladder cancer requiring in and out catheterizations, coronary artery disease, BPH. Updated ABI in our clinic was 0.93 on the right 2/14 Santyl to continue. Mechanical debridement 2/21; using Santyl. Still requiring mechanical debridement today which was disappointing. 3/15; 3-week follow-up. Using Santyl but still requiring mechanical debridements in the same spot in this wound. Most of the rest of the surface of this wound looks satisfactory however in the most proximal part  filled with mostly necrotic tissue. 3/29; 2-week follow-up. They have been using Santyl the patient's wife is changing the dressing as well as home health. Culture I did showed both Enterococcus and Klebsiella he was  treated with 7 days of Augmentin but I am going to extend this. X-ray was negative for osteomyelitis 4/7; weekly follow-up. I changed him to silver collagen last week no real improvement here. He has completed the Augmentin. He still has really 2 wounds part of it filled then but a deeper part that is of probing area right to the bone itself or at least close to it. A lot of necrotic debris continues in this area. We ran Dermagraft the last time he was here however it is being discontinued. Moreover in consideration of a skin substitute, we would need to have something that could fold and be molded into the wound itself 4/14; weekly follow-up. Silver collagen. I am attempting to get epi fix to try and close down the superior divot. CT scan of this area did not show underlying osteomyelitis. In fact they mention that the wound does not go to bone. Certainly felt like it did clinically however. Unfortunately to get epi fix we are going to need a hemoglobin A1c. His wife states they just had lab work done at the [place] and we will see if we can get a copy of the hemoglobin A1c. The patient says it was 8.1 4/28; patient presents for 2-week follow-up. He has been using silver collagen every other day to the right calcaneus. He has no complaints or issues today. He denies any fever/chills, purulent drainage, erythema or increased warmth to the foot 5/12; 2-week follow-up. She is using silver collagen. Epi fix has a $284 per application co-pay or thereabouts. Arrived in clinic with purulent drainage coming from the divot at 2:00 5/19; 1 week follow-up using silver collagen. Moderate drainage. Culture I did of this last week was negative. He is not on any antibiotics. Electronic  Signature(s) Signed: 03/30/2021 4:33:12 PM By: Linton Ham MD Entered By: Linton Ham on 03/30/2021 13:17:28 -------------------------------------------------------------------------------- Physical Exam Details Patient Name: Date of Service: [name] [name] 03/30/2021 12:30 PM Medical Record Number: [id number] Patient Account Number: [id number] Date of Birth/Sex: Treating RN: 1928-05-31 (87 y.o. [name] Small Primary Care Provider: Leonides Cave Other Clinician: Referring Provider: Treating Provider/Extender: Earnest Conroy Weeks in Treatment: 15 Constitutional Sitting or standing Blood Pressure is within target range for patient.. Pulse regular and within target range for patient.Marland Kitchen Respirations regular, non-labored and within target range.. Temperature is normal and within the target range for the patient.Marland Kitchen Appears in no distress. Notes Wound exam; right calcaneus at the tip. Really continues to be the tail of 2 wounds. The more distal part of this is superficial and closing in however he has a probing area going down 1.2 cm which is more towards the tip of his heel. There is no palpable bone but this is deep. I used a #3 curette to remove surrounding skin callus and subcutaneous tissue from around the superficial area no debridement of the tunneling was felt to be necessary. I did not see any drainage. There is no evidence of surrounding soft tissue infection Electronic Signature(s) Signed: 03/30/2021 4:33:12 PM By: Linton Ham MD Entered By: Linton Ham on 03/30/2021 13:19:04 -------------------------------------------------------------------------------- Physician Orders Details Patient Name: Date of Service: [name] [name] 03/30/2021 12:30 PM Medical Record Number: [id number] Patient Account Number: [id number] Date of Birth/Sex: Treating RN: 17-Feb-1928 (87 y.o. [name] Small Primary Care Provider: Leonides Cave Other Clinician: Referring Provider: Treating Provider/Extender: Earnest Conroy Weeks in Treatment: 15 Verbal / Phone Orders: No Diagnosis Coding  ICD-10 Coding Code Description E11.621 Type 2 diabetes mellitus with foot ulcer L97.418 Non-pressure chronic ulcer of right heel and midfoot with other specified severity E11.51 Type 2 diabetes mellitus with diabetic peripheral angiopathy without gangrene L03.115 Cellulitis of right lower limb E11.42 Type 2 diabetes mellitus with diabetic polyneuropathy Follow-up Appointments ppointment in 2 weeks. - Dr. Dellia Nims Return A Cellular or Tissue Based Products Cellular or Tissue Based Product Type: - Epifix has been approved by insurance. Will order when wound is ready. Bathing/ Shower/ Hygiene May shower and wash wound with soap and water. - prior to dressing change Edema Control - Lymphedema / SCD / Other Elevate legs to the level of the heart or above for 30 minutes daily and/or when sitting, a frequency of: Avoid standing for long periods of time. Off-Loading Heel suspension boot to: - Gloped heel offloading sandal to right foot Turn and reposition every 2 hours Other: - float heels off of bed and chair by placing pillow under calves Home Health No change in wound care orders this week; continue [place] for wound care. May utilize formulary equivalent dressing for wound treatment orders unless otherwise specified. Other Home Health Orders/Instructions: - [facility] for wound care 2-3x a week. Wound Treatment Wound #1 - Calcaneus Wound Laterality: Right Cleanser: Wound Cleanser (Home Health) Every Other Day/15 Days Discharge Instructions: Cleanse the wound with wound cleanser or normal saline prior to applying a clean dressing using gauze sponges, not tissue or cotton balls. Prim Dressing: Promogran Prisma Matrix, 4.34 (sq in) (silver collagen) (Home Health) Every Other Day/15 Days ary Discharge Instructions:  Moisten collagen with saline or hydrogel Secondary Dressing: Woven Gauze Sponge, Non-Sterile 4x4 in [facility]) (Generic) Every Other Day/15 Days Discharge Instructions: wet to dry packing the Prisma. Apply over primary dressing as directed. Secondary Dressing: ALLEVYN Heel 4 1/2in x 5 1/2in / 10.5cm x 13.5cm (Home Health) (Generic) Every Other Day/15 Days Discharge Instructions: Apply over primary dressing as directed. Secured With: [employer], 4.5x3.1 (in/yd) (Home Health) (Generic) Every Other Day/15 Days Discharge Instructions: Secure with Kerlix as directed. Secured With: Paper Tape, 2x10 (in/yd) (Home Health) (Generic) Every Other Day/15 Days Discharge Instructions: Secure dressing with tape as directed. Electronic Signature(s) Signed: 03/30/2021 4:33:12 PM By: Linton Ham MD Signed: 03/30/2021 5:20:22 PM By: Lorrin Jackson Previous Signature: 03/30/2021 12:59:44 PM Version By: Lorrin Jackson Entered By: Lorrin Jackson on 03/30/2021 13:20:42 -------------------------------------------------------------------------------- Problem List Details Patient Name: Date of Service: [name] [name] 03/30/2021 12:30 PM Medical Record Number: [id number] Patient Account Number: [id number] Date of Birth/Sex: Treating RN: 02/18/1928 (87 y.o. [name] Small Primary Care Provider: Leonides Cave Other Clinician: Referring Provider: Treating Provider/Extender: Lina Sayre, Purcell Mouton Weeks in Treatment: 15 Active Problems ICD-10 Encounter Code Description Active Date MDM Diagnosis E11.621 Type 2 diabetes mellitus with foot ulcer 12/11/2020 No Yes L97.418 Non-pressure chronic ulcer of right heel and midfoot with other specified 12/11/2020 No Yes severity E11.51 Type 2 diabetes mellitus with diabetic peripheral angiopathy without gangrene 12/11/2020 No Yes E11.42 Type 2 diabetes mellitus with diabetic polyneuropathy 12/11/2020 No Yes Inactive  Problems ICD-10 Code Description Active Date Inactive Date L03.115 Cellulitis of right lower limb 02/06/2021 02/06/2021 Resolved Problems Electronic Signature(s) Signed: 03/30/2021 4:33:12 PM By: Linton Ham MD Previous Signature: 03/30/2021 12:59:22 PM Version By: Lorrin Jackson Entered By: Linton Ham on 03/30/2021 13:16:38 -------------------------------------------------------------------------------- Progress Note Details Patient Name: Date of Service: [name] [name] 03/30/2021 12:30 PM Medical Record Number: [id number] Patient Account Number:  [id number] Date of Birth/Sex: Treating RN: 10-22-1928 (87 y.o. [name] Small Primary Care Provider: Leonides Cave Other Clinician: Referring Provider: Treating Provider/Extender: Earnest Conroy Weeks in Treatment: 15 Subjective History of Present Illness (HPI) ADMISSION 12/11/2020 This is a 87 year old man who apparently fractured his left hip in June and then had a subsequent fall and femur fracture requiring a separate surgery. He spent a long time at rehab at [place] skilled facility. Sometime in October his wife noted a blister on the left heel that is gradually morphed into a pressure ulcer. He saw his primary physician on 11/07/2020 and he was referred here. The patient had prior problems with PAD on the left. His last arterial studies in September 2020 showed an ABI on the right of 1.03 with a TBI of 0.73 and biphasic waveforms on the left a ABI of 0.84, TBI's were not done because the toe was surgically absent. He had monophasic and biphasic waveforms. In 2017 he had an angiogram showing a mom diffusely diseased SFA and occluded left popliteal he underwent a left popliteal and left peroneal artery angioplasty. Noteworthy on the LEFT the peroneal was the only patent vessel below the knee. I do not think his right side was actually studied Past medical history; type 2 diabetes on insulin with  peripheral neuropathy, hypertension, hyperlipidemia, hip fracture and subsequent femur fracture is noted, and emphysema, history of bladder cancer requiring in and out catheterizations, coronary artery disease, BPH. Updated ABI in our clinic was 0.93 on the right 2/14 Santyl to continue. Mechanical debridement 2/21; using Santyl. Still requiring mechanical debridement today which was disappointing. 3/15; 3-week follow-up. Using Santyl but still requiring mechanical debridements in the same spot in this wound. Most of the rest of the surface of this wound looks satisfactory however in the most proximal part filled with mostly necrotic tissue. 3/29; 2-week follow-up. They have been using Santyl the patient's wife is changing the dressing as well as home health. Culture I did showed both Enterococcus and Klebsiella he was treated with 7 days of Augmentin but I am going to extend this. X-ray was negative for osteomyelitis 4/7; weekly follow-up. I changed him to silver collagen last week no real improvement here. He has completed the Augmentin. He still has really 2 wounds part of it filled then but a deeper part that is of probing area right to the bone itself or at least close to it. A lot of necrotic debris continues in this area. We ran Dermagraft the last time he was here however it is being discontinued. Moreover in consideration of a skin substitute, we would need to have something that could fold and be molded into the wound itself 4/14; weekly follow-up. Silver collagen. I am attempting to get epi fix to try and close down the superior divot. CT scan of this area did not show underlying osteomyelitis. In fact they mention that the wound does not go to bone. Certainly felt like it did clinically however. Unfortunately to get epi fix we are going to need a hemoglobin A1c. His wife states they just had lab work done at the [place] and we will see if we can get a copy of the hemoglobin A1c. The patient  says it was 8.1 4/28; patient presents for 2-week follow-up. He has been using silver collagen every other day to the right calcaneus. He has no complaints or issues today. He denies any fever/chills, purulent drainage, erythema or increased warmth to the foot 5/12;  2-week follow-up. She is using silver collagen. Epi fix has a $161 per application co-pay or thereabouts. Arrived in clinic with purulent drainage coming from the divot at 2:00 5/19; 1 week follow-up using silver collagen. Moderate drainage. Culture I did of this last week was negative. He is not on any antibiotics. Objective Constitutional Sitting or standing Blood Pressure is within target range for patient.. Pulse regular and within target range for patient.Marland Kitchen Respirations regular, non-labored and within target range.. Temperature is normal and within the target range for the patient.Marland Kitchen Appears in no distress. Vitals Time Taken: 12:46 PM, Height: 74 in, Weight: 205 lbs, BMI: 26.3, Temperature: 97.6 F, Pulse: 74 bpm, Respiratory Rate: 17 breaths/min, Blood Pressure: 136/62 mmHg, Capillary Blood Glucose: 117 mg/dl. General Notes: Wound exam; right calcaneus at the tip. Really continues to be the tail of 2 wounds. The more distal part of this is superficial and closing in however he has a probing area going down 1.2 cm which is more towards the tip of his heel. There is no palpable bone but this is deep. I used a #3 curette to remove surrounding skin callus and subcutaneous tissue from around the superficial area no debridement of the tunneling was felt to be necessary. I did not see any drainage. There is no evidence of surrounding soft tissue infection Integumentary (Hair, Skin) Wound #1 status is Open. Original cause of wound was Pressure Injury. The date acquired was: 08/11/2020. The wound has been in treatment 15 weeks. The wound is located on the Right Calcaneus. The wound measures 1.5cm length x 1cm width x 0.7cm depth; 1.178cm^2  area and 0.825cm^3 volume. There is Fat Layer (Subcutaneous Tissue) exposed. There is no tunneling or undermining noted. There is a medium amount of serosanguineous drainage noted. The wound margin is well defined and not attached to the wound base. There is large (67-100%) pink, pale granulation within the wound bed. There is a small (1-33%) amount of necrotic tissue within the wound bed including Adherent Slough. Assessment Active Problems ICD-10 Type 2 diabetes mellitus with foot ulcer Non-pressure chronic ulcer of right heel and midfoot with other specified severity Type 2 diabetes mellitus with diabetic peripheral angiopathy without gangrene Type 2 diabetes mellitus with diabetic polyneuropathy Procedures Wound #1 Pre-procedure diagnosis of Wound #1 is a Diabetic Wound/Ulcer of the Lower Extremity located on the Right Calcaneus .Severity of Tissue Pre Debridement is: Fat layer exposed. There was a Excisional Skin/Subcutaneous Tissue Debridement with a total area of 1.5 sq cm performed by Ricard Dillon., MD. With the following instrument(s): Curette to remove Non-Viable tissue/material. Material removed includes Subcutaneous Tissue after achieving pain control using Other (Benzocaine). No specimens were taken. A time out was conducted at 13:06, prior to the start of the procedure. A Minimum amount of bleeding was controlled with Pressure. The procedure was tolerated well. Post Debridement Measurements: 1.5cm length x 1cm width x 1.1cm depth; 1.296cm^3 volume. Character of Wound/Ulcer Post Debridement is stable. Severity of Tissue Post Debridement is: Fat layer exposed. Post procedure Diagnosis Wound #1: Same as Pre-Procedure Plan Follow-up Appointments: Return Appointment in 2 weeks. - Dr. Dellia Nims Cellular or Tissue Based Products: Cellular or Tissue Based Product Type: - Epifix has been approved by insurance. Will order when wound is ready. Bathing/ Shower/ Hygiene: May shower and  wash wound with soap and water. - prior to dressing change Edema Control - Lymphedema / SCD / Other: Elevate legs to the level of the heart or above for 30 minutes daily  and/or when sitting, a frequency of: Avoid standing for long periods of time. Off-Loading: Heel suspension boot to: - Gloped heel offloading sandal to right foot Turn and reposition every 2 hours Other: - float heels off of bed and chair by placing pillow under calves Home Health: No change in wound care orders this week; continue [place] for wound care. May utilize formulary equivalent dressing for wound treatment orders unless otherwise specified. Other Home Health Orders/Instructions: - [facility] for wound care 2-3x a week. WOUND #1: - Calcaneus Wound Laterality: Right Cleanser: Wound Cleanser (Home Health) 1 x Per Day/15 Days Discharge Instructions: Cleanse the wound with wound cleanser or normal saline prior to applying a clean dressing using gauze sponges, not tissue or cotton balls. Prim Dressing: Promogran Prisma Matrix, 4.34 (sq in) (silver collagen) (Home Health) 1 x Per Day/15 Days ary Discharge Instructions: Moisten collagen with saline or hydrogel Secondary Dressing: Woven Gauze Sponge, Non-Sterile 4x4 in (Home Health) (Generic) 1 x Per Day/15 Days Discharge Instructions: wet to dry packing the Prisma. Apply over primary dressing as directed. Secondary Dressing: ALLEVYN Heel 4 1/2in x 5 1/2in / 10.5cm x 13.5cm (Home Health) (Generic) 1 x Per Day/15 Days Discharge Instructions: Apply over primary dressing as directed. Secured With: [employer], 4.5x3.1 (in/yd) (Home Health) (Generic) 1 x Per Day/15 Days Discharge Instructions: Secure with Kerlix as directed. Secured With: Paper T ape, 2x10 (in/yd) (Home Health) (Generic) 1 x Per Day/15 Days Discharge Instructions: Secure dressing with tape as directed. 1. She is changing the collagen-based dressings every day this may be superfluous and I will have  her change it to every second day 2. We are using a heel cup and gauze 3. I emphasized the offloading issue here both the patient and his wife expressed understanding. They are using heel off loader. He has a Social worker at [employer]) Signed: 03/30/2021 4:33:12 PM By: Linton Ham MD Entered By: Linton Ham on 03/30/2021 13:21:22 -------------------------------------------------------------------------------- SuperBill Details Patient Name: Date of Service: [name] [name] 03/30/2021 Medical Record Number: [id number] Patient Account Number: [id number] Date of Birth/Sex: Treating RN: [birth year] (87 y.o. [name] Small Primary Care Provider: Leonides Cave Other Clinician: Referring Provider: Treating Provider/Extender: Earnest Conroy Weeks in Treatment: 15 Diagnosis Coding ICD-10 Codes Code Description [id number] Type 2 diabetes mellitus with foot ulcer L97.418 Non-pressure chronic ulcer of right heel and midfoot with other specified severity E11.51 Type 2 diabetes mellitus with diabetic peripheral angiopathy without gangrene E11.42 Type 2 diabetes mellitus with diabetic polyneuropathy Facility Procedures CPT4 Code: [id number] Description: 28003 - DEB SUBQ TISSUE 20 SQ CM/< ICD-10 Diagnosis Description L97.418 Non-pressure chronic ulcer of right heel and midfoot with other specified seve Modifier: rity Quantity: 1 Physician Procedures : CPT4 Code Description Modifier [id number] - WC PHYS SUBQ TISS 20 SQ CM ICD-10 Diagnosis Description L97.418 Non-pressure chronic ulcer of right heel and midfoot with other specified severity Quantity: 1 Electronic Signature(s) Signed: 03/30/2021 4:33:12 PM By: Linton Ham MD Entered By: Linton Ham on 03/30/2021 13:25:24

## 2021-04-02 NOTE — Progress Notes (Signed)
DONNIS, PHANEUF ([id number]) Visit Report for 03/30/2021 Arrival Information Details Patient Name: Date of Service: DURREL, MCNEE 03/30/2021 12:30 PM Medical Record Number: [id number] Patient Account Number: [id number] Date of Birth/Sex: Treating RN: 1928-07-23 (87 y.o. [name], [name] Primary Care Lanitra Battaglini: Leonides Cave Other Clinician: Referring Katianne Barre: Treating Kalika Smay/Extender: Earnest Conroy Weeks in Treatment: 15 Visit Information History Since Last Visit Added or deleted any medications: No Patient Arrived: Ambulatory Any new allergies or adverse reactions: No Arrival Time: 12:45 Had a fall or experienced change in No Accompanied By: wife activities of daily living that may affect Transfer Assistance: None risk of falls: Patient Identification Verified: Yes Signs or symptoms of abuse/neglect since last visito No Secondary Verification Process Completed: Yes Hospitalized since last visit: No Patient Requires Transmission-Based Precautions: No Implantable device outside of the clinic excluding No Patient Has Alerts: No cellular tissue based products placed in the center since last visit: Has Dressing in Place as Prescribed: Yes Pain Present Now: No Electronic Signature(s) Signed: 03/30/2021 5:22:38 PM By: Rhae Hammock RN Entered By: Rhae Hammock on 03/30/2021 12:46:01 -------------------------------------------------------------------------------- Encounter Discharge Information Details Patient Name: Date of Service: [name] 03/30/2021 12:30 PM Medical Record Number: [id number] Patient Account Number: [id number] Date of Birth/Sex: Treating RN: 1928-09-08 (87 y.o. [name] Primary Care Ziona Wickens: Leonides Cave Other Clinician: Referring Awab Abebe: Treating Latika Kronick/Extender: Earnest Conroy Weeks in Treatment: 15 Encounter Discharge Information Items Post Procedure  Vitals Discharge Condition: Stable Temperature (F): 97.6 Ambulatory Status: Wheelchair Pulse (bpm): 74 Discharge Destination: Home Respiratory Rate (breaths/min): 17 Transportation: Private Auto Blood Pressure (mmHg): 136/62 Accompanied By: wife Schedule Follow-up Appointment: Yes Clinical Summary of Care: Patient Declined Electronic Signature(s) Signed: 04/02/2021 5:27:53 PM By: Levan Hurst RN, BSN Entered By: Levan Hurst on 03/30/2021 13:34:14 -------------------------------------------------------------------------------- Lower Extremity Assessment Details Patient Name: Date of Service: [name] 03/30/2021 12:30 PM Medical Record Number: [id number] Patient Account Number: [id number] Date of Birth/Sex: Treating RN: Mar 30, 1928 (87 y.o. [name], [name] Primary Care Alajah Witman: Leonides Cave Other Clinician: Referring Tilton Marsalis: Treating Jackye Dever/Extender: Earnest Conroy Weeks in Treatment: 15 Edema Assessment Assessed: Shirlyn Goltz: No] Patrice Paradise: Yes] Edema: [Left: N] [Right: o] Calf Left: Right: Point of Measurement: 33 cm From Medial Instep 33 cm Ankle Left: Right: Point of Measurement: 12 cm From Medial Instep 22 cm Vascular Assessment Pulses: Dorsalis Pedis Palpable: [Right:Yes] Doppler Audible: [Right:Yes] Posterior Tibial Palpable: [Right:Yes] Electronic Signature(s) Signed: 03/30/2021 5:22:38 PM By: Rhae Hammock RN Entered By: Rhae Hammock on 03/30/2021 12:54:39 -------------------------------------------------------------------------------- Multi Wound Chart Details Patient Name: Date of Service: [name] 03/30/2021 12:30 PM Medical Record Number: [id number] Patient Account Number: [id number] Date of Birth/Sex: Treating RN: 1928/05/25 (87 y.o. [name] Primary Care Kimm Sider: Leonides Cave Other Clinician: Referring Chay Mazzoni: Treating Delanee Xin/Extender: Earnest Conroy Weeks in Treatment: 15 Vital Signs Height(in): 74 Capillary Blood Glucose(mg/dl): 117 Weight(lbs): 205 Pulse(bpm): 73 Body Mass Index(BMI): 26 Blood Pressure(mmHg): 136/62 Temperature(F): 97.6 Respiratory Rate(breaths/min): 17 Photos: [1:No Photos Right Calcaneus] [N/A:N/A N/A] Wound Location: [1:Pressure Injury] [N/A:N/A] Wounding Event: [1:Diabetic Wound/Ulcer of the Lower] [N/A:N/A] Primary Etiology: [1:Extremity Pressure Ulcer] [N/A:N/A] Secondary Etiology: [1:Chronic Obstructive Pulmonary] [N/A:N/A] Comorbid History: [1:Disease (COPD), Congestive Heart Failure, Coronary Artery Disease, Hypertension, Peripheral Arterial Disease, Type II Diabetes, Neuropathy 08/11/2020] [N/A:N/A] Date Acquired: [1:15] [N/A:N/A] Weeks of Treatment: [1:Open] [N/A:N/A] Wound Status: [1:1.5x1x0.7] [N/A:N/A] Measurements L x W x D (cm) [1:1.178] [N/A:N/A] A (  cm) : rea [1:0.825] [N/A:N/A] Volume (cm) : [1:83.80%] [N/A:N/A] % Reduction in A [1:rea: -13.60%] [N/A:N/A] % Reduction in Volume: [1:Grade 2] [N/A:N/A] Classification: [1:Medium] [N/A:N/A] Exudate A mount: [1:Serosanguineous] [N/A:N/A] Exudate Type: [1:red, brown] [N/A:N/A] Exudate Color: [1:Well defined, not attached] [N/A:N/A] Wound Margin: [1:Large (67-100%)] [N/A:N/A] Granulation A mount: [1:Pink, Pale] [N/A:N/A] Granulation Quality: [1:Small (1-33%)] [N/A:N/A] Necrotic A mount: [1:Fat Layer (Subcutaneous Tissue): Yes N/A] Exposed Structures: [1:Fascia: No Tendon: No Muscle: No Joint: No Bone: No Medium (34-66%)] [N/A:N/A] Epithelialization: [1:Debridement - Excisional] [N/A:N/A] Debridement: Pre-procedure Verification/Time Out 13:06 [N/A:N/A] Taken: [1:Other] [N/A:N/A] Pain Control: [1:Subcutaneous] [N/A:N/A] Tissue Debrided: [1:Skin/Subcutaneous Tissue] [N/A:N/A] Level: [1:1.5] [N/A:N/A] Debridement A (sq cm): [1:rea Curette] [N/A:N/A] Instrument: [1:Minimum] [N/A:N/A] Bleeding: [1:Pressure] [N/A:N/A] Hemostasis  A chieved: [1:Procedure was tolerated well] [N/A:N/A] Debridement Treatment Response: [1:1.5x1x1.1] [N/A:N/A] Post Debridement Measurements L x W x D (cm) [1:1.296] [N/A:N/A] Post Debridement Volume: (cm) [1:Debridement] [N/A:N/A] Treatment Notes Electronic Signature(s) Signed: 03/30/2021 4:33:12 PM By: Linton Ham MD Signed: 03/30/2021 5:20:22 PM By: Lorrin Jackson Entered By: Linton Ham on 03/30/2021 13:16:43 -------------------------------------------------------------------------------- Multi-Disciplinary Care Plan Details Patient Name: Date of Service: [name] 03/30/2021 12:30 PM Medical Record Number: [id number] Patient Account Number: [id number] Date of Birth/Sex: Treating RN: 12-15-1927 (87 y.o. [name] Primary Care Athziri Freundlich: Leonides Cave Other Clinician: Referring Burkley Dech: Treating Eran Mistry/Extender: Veneta Penton in Treatment: 15 Multidisciplinary Care Plan reviewed with physician Active Inactive Abuse / Safety / Falls / Self Care Management Nursing Diagnoses: History of Falls Impaired physical mobility Potential for injury related to falls Goals: Patient will not experience any injury related to falls Date Initiated: 12/11/2020 Target Resolution Date: 04/06/2021 Goal Status: Active Patient/caregiver will verbalize/demonstrate measures taken to prevent injury and/or falls Date Initiated: 12/11/2020 Date Inactivated: 02/06/2021 Target Resolution Date: 01/25/2021 Goal Status: Met Interventions: Assess Activities of Daily Living upon admission and as needed Assess fall risk on admission and as needed Assess: immobility, friction, shearing, incontinence upon admission and as needed Assess impairment of mobility on admission and as needed per policy Patient/Caregiver referred to community resources (specify in notes) Provide education on fall prevention Provide education on personal and home  safety Notes: Nutrition Nursing Diagnoses: Impaired glucose control: actual or potential Potential for alteratiion in Nutrition/Potential for imbalanced nutrition Goals: Patient/caregiver agrees to and verbalizes understanding of need to use nutritional supplements and/or vitamins as prescribed Date Initiated: 12/11/2020 Date Inactivated: 02/15/2021 Target Resolution Date: 03/09/2021 Goal Status: Met Patient/caregiver will maintain therapeutic glucose control Date Initiated: 12/11/2020 Target Resolution Date: 04/06/2021 Goal Status: Active Interventions: Assess HgA1c results as ordered upon admission and as needed Assess patient nutrition upon admission and as needed per policy Provide education on elevated blood sugars and impact on wound healing Provide education on nutrition Treatment Activities: Education provided on Nutrition : 03/22/2021 Notes: Pressure Nursing Diagnoses: Knowledge deficit related to causes and risk factors for pressure ulcer development Knowledge deficit related to management of pressures ulcers Potential for impaired tissue integrity related to pressure, friction, moisture, and shear Goals: Patient/caregiver will verbalize risk factors for pressure ulcer development Date Initiated: 12/11/2020 Target Resolution Date: 04/06/2021 Goal Status: Active Patient/caregiver will verbalize understanding of pressure ulcer management Date Initiated: 12/11/2020 Date Inactivated: 02/15/2021 Target Resolution Date: 02/25/2021 Goal Status: Met Interventions: Assess: immobility, friction, shearing, incontinence upon admission and as needed Assess offloading mechanisms upon admission and as needed Assess potential for pressure ulcer upon admission and as needed Provide education on pressure ulcers Notes: Electronic Signature(s) Signed: 03/30/2021 12:59:53 PM By:  Lorrin Jackson Entered By: Lorrin Jackson on 03/30/2021  12:59:53 -------------------------------------------------------------------------------- Pain Assessment Details Patient Name: Date of Service: CHANCE, MUNTER 03/30/2021 12:30 PM Medical Record Number: [id number] Patient Account Number: [id number] Date of Birth/Sex: Treating RN: 14-Nov-1927 (87 y.o. [name], [name] Primary Care Nara Paternoster: Leonides Cave Other Clinician: Referring Avonda Toso: Treating Signa Cheek/Extender: Earnest Conroy Weeks in Treatment: 15 Active Problems Location of Pain Severity and Description of Pain Patient Has Paino No Site Locations Rate the pain. Current Pain Level: 0 Pain Management and Medication Current Pain Management: Electronic Signature(s) Signed: 03/30/2021 5:22:38 PM By: Rhae Hammock RN Entered By: Rhae Hammock on 03/30/2021 12:54:18 -------------------------------------------------------------------------------- Patient/Caregiver Education Details Patient Name: Date of Service: Enid Baas 5/20/2022andnbsp12:30 PM Medical Record Number: [id number] Patient Account Number: [id number] Date of Birth/Gender: Treating RN: February 23, 1928 (87 y.o. [name] Primary Care Physician: Leonides Cave Other Clinician: Referring Physician: Treating Physician/Extender: Veneta Penton in Treatment: 15 Education Assessment Education Provided To: Patient Education Topics Provided Elevated Blood Sugar/ Impact on Healing: Methods: Explain/Verbal, Printed Responses: State content correctly Offloading: Methods: Explain/Verbal, Printed Responses: State content correctly Wound/Skin Impairment: Methods: Explain/Verbal, Printed Responses: State content correctly Electronic Signature(s) Signed: 03/30/2021 5:20:22 PM By: Lorrin Jackson Entered By: Lorrin Jackson on 03/30/2021 13:00:50 -------------------------------------------------------------------------------- Wound  Assessment Details Patient Name: Date of Service: [name] 03/30/2021 12:30 PM Medical Record Number: [id number] Patient Account Number: [id number] Date of Birth/Sex: Treating RN: 05-16-1928 (87 y.o. [name], [place] Primary Care Clairissa Valvano: Leonides Cave Other Clinician: Referring Tarita Deshmukh: Treating Jakyah Bradby/Extender: Earnest Conroy Weeks in Treatment: 15 Wound Status Wound Number: 1 Primary Diabetic Wound/Ulcer of the Lower Extremity Etiology: Wound Location: Right Calcaneus Secondary Pressure Ulcer Wounding Event: Pressure Injury Etiology: Date Acquired: 08/11/2020 Wound Open Weeks Of Treatment: 15 Status: Clustered Wound: No Comorbid Chronic Obstructive Pulmonary Disease (COPD), Congestive History: Heart Failure, Coronary Artery Disease, Hypertension, Peripheral Arterial Disease, Type II Diabetes, Neuropathy Photos Wound Measurements Length: (cm) 1.5 Width: (cm) 1 Depth: (cm) 0.7 Area: (cm) 1.178 Volume: (cm) 0.825 % Reduction in Area: 83.8% % Reduction in Volume: -13.6% Epithelialization: Medium (34-66%) Tunneling: No Undermining: No Wound Description Classification: Grade 2 Wound Margin: Well defined, not attached Exudate Amount: Medium Exudate Type: Serosanguineous Exudate Color: red, brown Foul Odor After Cleansing: No Slough/Fibrino Yes Wound Bed Granulation Amount: Large (67-100%) Exposed Structure Granulation Quality: Pink, Pale Fascia Exposed: No Necrotic Amount: Small (1-33%) Fat Layer (Subcutaneous Tissue) Exposed: Yes Necrotic Quality: Adherent Slough Tendon Exposed: No Muscle Exposed: No Joint Exposed: No Bone Exposed: No Treatment Notes Wound #1 (Calcaneus) Wound Laterality: Right Cleanser Wound Cleanser Discharge Instruction: Cleanse the wound with wound cleanser or normal saline prior to applying a clean dressing using gauze sponges, not tissue or cotton balls. Peri-Wound Care Topical Primary  Dressing Promogran Prisma Matrix, 4.34 (sq in) (silver collagen) Discharge Instruction: Moisten collagen with saline or hydrogel Secondary Dressing Woven Gauze Sponge, Non-Sterile 4x4 in Discharge Instruction: wet to dry packing the Prisma. Apply over primary dressing as directed. ALLEVYN Heel 4 1/2in x 5 1/2in / 10.5cm x 13.5cm Discharge Instruction: Apply over primary dressing as directed. Secured With [employer], 4.5x3.1 (in/yd) Discharge Instruction: Secure with Kerlix as directed. Paper Tape, 2x10 (in/yd) Discharge Instruction: Secure dressing with tape as directed. Compression Wrap Compression Stockings Add-Ons Electronic Signature(s) Signed: 04/02/2021 3:41:13 PM By: Sandre Kitty Signed: 04/02/2021 5:30:27 PM By: Rhae Hammock RN Previous Signature: 03/30/2021 5:22:38 PM Version By:  Rhae Hammock RN Entered By: Sandre Kitty on 04/02/2021 15:24:47 -------------------------------------------------------------------------------- Vitals Details Patient Name: Date of Service: KAILO, KOSIK [place] RIO N. 03/30/2021 12:30 PM Medical Record Number: [id number] Patient Account Number: [id number] Date of Birth/Sex: Treating RN: Feb 07, 1928 (87 y.o. [name], [name] Primary Care Son Barkan: Leonides Cave Other Clinician: Referring Aleira Deiter: Treating Denisha Hoel/Extender: Earnest Conroy Weeks in Treatment: 15 Vital Signs Time Taken: 12:46 Temperature (F): 97.6 Height (in): 74 Pulse (bpm): 74 Weight (lbs): 205 Respiratory Rate (breaths/min): 17 Body Mass Index (BMI): 26.3 Blood Pressure (mmHg): 136/62 Capillary Blood Glucose (mg/dl): 117 Reference Range: 80 - 120 mg / dl Electronic Signature(s) Signed: 03/30/2021 5:22:38 PM By: Rhae Hammock RN Entered By: Rhae Hammock on 03/30/2021 12:46:33

## 2021-04-03 DIAGNOSIS — E1142 Type 2 diabetes mellitus with diabetic polyneuropathy: Secondary | ICD-10-CM | POA: Diagnosis not present

## 2021-04-03 DIAGNOSIS — J439 Emphysema, unspecified: Secondary | ICD-10-CM | POA: Diagnosis not present

## 2021-04-03 DIAGNOSIS — E11621 Type 2 diabetes mellitus with foot ulcer: Secondary | ICD-10-CM | POA: Diagnosis not present

## 2021-04-03 DIAGNOSIS — L97412 Non-pressure chronic ulcer of right heel and midfoot with fat layer exposed: Secondary | ICD-10-CM | POA: Diagnosis not present

## 2021-04-03 DIAGNOSIS — Z9181 History of falling: Secondary | ICD-10-CM | POA: Diagnosis not present

## 2021-04-03 DIAGNOSIS — E785 Hyperlipidemia, unspecified: Secondary | ICD-10-CM | POA: Diagnosis not present

## 2021-04-03 DIAGNOSIS — I509 Heart failure, unspecified: Secondary | ICD-10-CM | POA: Diagnosis not present

## 2021-04-03 DIAGNOSIS — E1151 Type 2 diabetes mellitus with diabetic peripheral angiopathy without gangrene: Secondary | ICD-10-CM | POA: Diagnosis not present

## 2021-04-03 DIAGNOSIS — Z8551 Personal history of malignant neoplasm of bladder: Secondary | ICD-10-CM | POA: Diagnosis not present

## 2021-04-03 DIAGNOSIS — I110 Hypertensive heart disease with heart failure: Secondary | ICD-10-CM | POA: Diagnosis not present

## 2021-04-03 NOTE — Telephone Encounter (Signed)
Please update patient/wife to continue flomax. Thanks.

## 2021-04-03 NOTE — Telephone Encounter (Signed)
Called Dr. Shaune Leeks office back as we had not heard back on this matter. Looks like Caryl Pina did receive the message from [place] and Dr. Louis Meckel said patient does need to continue the flomax. It was documented that they called our office and notified us of this but nothing was documented here.

## 2021-04-03 NOTE — Telephone Encounter (Signed)
Spoke with wife [name] (okay per [facility]) and advised patient is to continue taking flomax per Dr. Louis Meckel.

## 2021-04-09 DIAGNOSIS — J9600 Acute respiratory failure, unspecified whether with hypoxia or hypercapnia: Secondary | ICD-10-CM | POA: Diagnosis not present

## 2021-04-09 DIAGNOSIS — R062 Wheezing: Secondary | ICD-10-CM | POA: Diagnosis not present

## 2021-04-09 DIAGNOSIS — J449 Chronic obstructive pulmonary disease, unspecified: Secondary | ICD-10-CM | POA: Diagnosis not present

## 2021-04-09 DIAGNOSIS — I509 Heart failure, unspecified: Secondary | ICD-10-CM | POA: Diagnosis not present

## 2021-04-10 ENCOUNTER — Encounter (HOSPITAL_BASED_OUTPATIENT_CLINIC_OR_DEPARTMENT_OTHER): Payer: Self-pay

## 2021-04-10 ENCOUNTER — Ambulatory Visit (HOSPITAL_BASED_OUTPATIENT_CLINIC_OR_DEPARTMENT_OTHER): Admit: 2021-04-10 | Payer: Medicare Other

## 2021-04-10 SURGERY — CYSTOSCOPY
Anesthesia: General

## 2021-04-11 DIAGNOSIS — E1142 Type 2 diabetes mellitus with diabetic polyneuropathy: Secondary | ICD-10-CM | POA: Diagnosis not present

## 2021-04-11 DIAGNOSIS — Z8551 Personal history of malignant neoplasm of bladder: Secondary | ICD-10-CM | POA: Diagnosis not present

## 2021-04-11 DIAGNOSIS — I110 Hypertensive heart disease with heart failure: Secondary | ICD-10-CM | POA: Diagnosis not present

## 2021-04-11 DIAGNOSIS — I509 Heart failure, unspecified: Secondary | ICD-10-CM | POA: Diagnosis not present

## 2021-04-11 DIAGNOSIS — L97412 Non-pressure chronic ulcer of right heel and midfoot with fat layer exposed: Secondary | ICD-10-CM | POA: Diagnosis not present

## 2021-04-11 DIAGNOSIS — E1151 Type 2 diabetes mellitus with diabetic peripheral angiopathy without gangrene: Secondary | ICD-10-CM | POA: Diagnosis not present

## 2021-04-11 DIAGNOSIS — E11621 Type 2 diabetes mellitus with foot ulcer: Secondary | ICD-10-CM | POA: Diagnosis not present

## 2021-04-11 DIAGNOSIS — Z9181 History of falling: Secondary | ICD-10-CM | POA: Diagnosis not present

## 2021-04-11 DIAGNOSIS — E785 Hyperlipidemia, unspecified: Secondary | ICD-10-CM | POA: Diagnosis not present

## 2021-04-11 DIAGNOSIS — J439 Emphysema, unspecified: Secondary | ICD-10-CM | POA: Diagnosis not present

## 2021-04-13 ENCOUNTER — Other Ambulatory Visit: Payer: Self-pay

## 2021-04-13 ENCOUNTER — Encounter (HOSPITAL_BASED_OUTPATIENT_CLINIC_OR_DEPARTMENT_OTHER): Payer: Medicare Other | Attending: Internal Medicine | Admitting: Internal Medicine

## 2021-04-13 DIAGNOSIS — R339 Retention of urine, unspecified: Secondary | ICD-10-CM | POA: Diagnosis not present

## 2021-04-13 DIAGNOSIS — E1151 Type 2 diabetes mellitus with diabetic peripheral angiopathy without gangrene: Secondary | ICD-10-CM | POA: Diagnosis not present

## 2021-04-13 DIAGNOSIS — E1142 Type 2 diabetes mellitus with diabetic polyneuropathy: Secondary | ICD-10-CM | POA: Insufficient documentation

## 2021-04-13 DIAGNOSIS — Z8551 Personal history of malignant neoplasm of bladder: Secondary | ICD-10-CM | POA: Diagnosis not present

## 2021-04-13 DIAGNOSIS — L97412 Non-pressure chronic ulcer of right heel and midfoot with fat layer exposed: Secondary | ICD-10-CM | POA: Diagnosis not present

## 2021-04-13 DIAGNOSIS — L89629 Pressure ulcer of left heel, unspecified stage: Secondary | ICD-10-CM | POA: Insufficient documentation

## 2021-04-14 DIAGNOSIS — E1142 Type 2 diabetes mellitus with diabetic polyneuropathy: Secondary | ICD-10-CM | POA: Diagnosis not present

## 2021-04-14 DIAGNOSIS — I509 Heart failure, unspecified: Secondary | ICD-10-CM | POA: Diagnosis not present

## 2021-04-14 DIAGNOSIS — Z9181 History of falling: Secondary | ICD-10-CM | POA: Diagnosis not present

## 2021-04-14 DIAGNOSIS — E785 Hyperlipidemia, unspecified: Secondary | ICD-10-CM | POA: Diagnosis not present

## 2021-04-14 DIAGNOSIS — I110 Hypertensive heart disease with heart failure: Secondary | ICD-10-CM | POA: Diagnosis not present

## 2021-04-14 DIAGNOSIS — J439 Emphysema, unspecified: Secondary | ICD-10-CM | POA: Diagnosis not present

## 2021-04-14 DIAGNOSIS — E11621 Type 2 diabetes mellitus with foot ulcer: Secondary | ICD-10-CM | POA: Diagnosis not present

## 2021-04-14 DIAGNOSIS — E1151 Type 2 diabetes mellitus with diabetic peripheral angiopathy without gangrene: Secondary | ICD-10-CM | POA: Diagnosis not present

## 2021-04-14 DIAGNOSIS — L97412 Non-pressure chronic ulcer of right heel and midfoot with fat layer exposed: Secondary | ICD-10-CM | POA: Diagnosis not present

## 2021-04-14 DIAGNOSIS — Z8551 Personal history of malignant neoplasm of bladder: Secondary | ICD-10-CM | POA: Diagnosis not present

## 2021-04-14 NOTE — Progress Notes (Signed)
[name], [name] ([id number]) Visit Report for 04/13/2021 HPI Details Patient Name: Date of Service: [name] Small, [name] Small [place] [facility] Small. 04/13/2021 12:30 PM Medical Record Number: [id number] Patient Account Number: [id number] Date of Birth/Sex: Treating RN: 1927/11/23 (87 y.o. [name] Small Primary Care Provider: Leonides Cave Other Clinician: Referring Provider: Treating Provider/Extender: Earnest Conroy Weeks in Treatment: 17 History of Present Illness HPI Description: ADMISSION 12/11/2020 This is a 87 year old man who apparently fractured his left hip in June and then had a subsequent fall and femur fracture requiring a separate surgery. He spent a long time at rehab at [place] skilled facility. Sometime in October his wife noted a blister on the left heel that is gradually morphed into a pressure ulcer. He saw his primary physician on 11/07/2020 and he was referred here. The patient had prior problems with PAD on the left. His last arterial studies in September 2020 showed an ABI on the right of 1.03 with a TBI of 0.73 and biphasic waveforms on the left a ABI of 0.84, TBI's were not done because the toe was surgically absent. He had monophasic and biphasic waveforms. In 2017 he had an angiogram showing a mom diffusely diseased SFA and occluded left popliteal he underwent a left popliteal and left peroneal artery angioplasty. Noteworthy on the LEFT the peroneal was the only patent vessel below the knee. I do not think his right side was actually studied Past medical history; type 2 diabetes on insulin with peripheral neuropathy, hypertension, hyperlipidemia, hip fracture and subsequent femur fracture is noted, and emphysema, history of bladder cancer requiring in and out catheterizations, coronary artery disease, BPH. Updated ABI in our clinic was 0.93 on the right 2/14 Santyl to continue. Mechanical debridement 2/21; using Santyl. Still requiring mechanical debridement today  which was disappointing. 3/15; 3-week follow-up. Using Santyl but still requiring mechanical debridements in the same spot in this wound. Most of the rest of the surface of this wound looks satisfactory however in the most proximal part filled with mostly necrotic tissue. 3/29; 2-week follow-up. They have been using Santyl the patient's wife is changing the dressing as well as home health. Culture I did showed both Enterococcus and Klebsiella he was treated with 7 days of Augmentin but I am going to extend this. X-ray was negative for osteomyelitis 4/7; weekly follow-up. I changed him to silver collagen last week no real improvement here. He has completed the Augmentin. He still has really 2 wounds part of it filled then but a deeper part that is of probing area right to the bone itself or at least close to it. A lot of necrotic debris continues in this area. We ran Dermagraft the last time he was here however it is being discontinued. Moreover in consideration of a skin substitute, we would need to have something that could fold and be molded into the wound itself 4/14; weekly follow-up. Silver collagen. I am attempting to get epi fix to try and close down the superior divot. CT scan of this area did not show underlying osteomyelitis. In fact they mention that the wound does not go to bone. Certainly felt like it did clinically however. Unfortunately to get epi fix we are going to need a hemoglobin A1c. His wife states they just had lab work done at the [place] and we will see if we can get a copy of the hemoglobin A1c. The patient says it was 8.1 4/28; patient presents for 2-week follow-up. He has been using silver  collagen every other day to the right calcaneus. He has no complaints or issues today. He denies any fever/chills, purulent drainage, erythema or increased warmth to the foot 5/12; 2-week follow-up. She is using silver collagen. Epi fix has a $893 per application co-pay or thereabouts. Arrived  in clinic with purulent drainage coming from the divot at 2:00 5/19; 1 week follow-up using silver collagen. Moderate drainage. Culture I did of this last week was negative. He is not on any antibiotics. 6/3; using silver collagen. Minimal drainage. He has home health they are coming once a week but most of the time his wife changes this. There were 2 parts to this wound a more superficial part however that is totally epithelialized now he has however a deeper tunnel roughly 0.9 cm deep today. This does not probe to bone Electronic Signature(s) Signed: 04/14/2021 7:13:41 AM By: Linton Ham MD Entered By: Linton Ham on 04/13/2021 13:28:54 -------------------------------------------------------------------------------- Physical Exam Details Patient Name: Date of Service: [name] Small. 04/13/2021 12:30 PM Medical Record Number: [id number] Patient Account Number: [id number] Date of Birth/Sex: Treating RN: May 31, 1928 (87 y.o. [name] Small Primary Care Provider: Leonides Cave Other Clinician: Referring Provider: Treating Provider/Extender: Earnest Conroy Weeks in Treatment: 17 Constitutional Patient is hypotensive. However he appears well. Pulse regular and within target range for patient.Marland Kitchen Respirations regular, non-labored and within target range.. Temperature is normal and within the target range for the patient.Marland Kitchen Appears in no distress. Notes Wound exam; right calcaneus at the tip. The superficial part of this is totally epithelialized however he has the remaining tunnel at 0.9 cm this does not probe to bone there is no purulent drainage. There is no tenderness around this and no erythema. He does not have an arterial issue Electronic Signature(s) Signed: 04/14/2021 7:13:41 AM By: Linton Ham MD Entered By: Linton Ham on 04/13/2021 13:30:01 -------------------------------------------------------------------------------- Physician Orders  Details Patient Name: Date of Service: [name] Small. 04/13/2021 12:30 PM Medical Record Number: [id number] Patient Account Number: [id number] Date of Birth/Sex: Treating RN: 27-May-1928 (87 y.o. [name] Small Primary Care Provider: Leonides Cave Other Clinician: Referring Provider: Treating Provider/Extender: Earnest Conroy Weeks in Treatment: 17 Verbal / Phone Orders: No Diagnosis Coding ICD-10 Coding Code Description E11.621 Type 2 diabetes mellitus with foot ulcer L97.418 Non-pressure chronic ulcer of right heel and midfoot with other specified severity E11.51 Type 2 diabetes mellitus with diabetic peripheral angiopathy without gangrene E11.42 Type 2 diabetes mellitus with diabetic polyneuropathy Follow-up Appointments ppointment in 2 weeks. - Dr. Dellia Nims Return A Cellular or Tissue Based Products Cellular or Tissue Based Product Type: - 04/13/21: Run insurance for Single Layer Oasis Epifix has been approved by Insurance underwriter. Will order when wound is ready. Bathing/ Shower/ Hygiene May shower and wash wound with soap and water. - prior to dressing change Edema Control - Lymphedema / SCD / Other Elevate legs to the level of the heart or above for 30 minutes daily and/or when sitting, a frequency of: Avoid standing for long periods of time. Off-Loading Heel suspension boot to: - Gloped heel offloading sandal to right foot Turn and reposition every 2 hours Other: - float heels off of bed and chair by placing pillow under calves Home Health No change in wound care orders this week; continue [place] for wound care. May utilize formulary equivalent dressing for wound treatment orders unless otherwise specified. Other Home Health Orders/Instructions: - [facility] for wound care 2-3x a week. Wound  Treatment Wound #1 - Calcaneus Wound Laterality: Right Cleanser: Wound Cleanser (Home Health) Every Other Day/15 Days Discharge Instructions: Cleanse the  wound with wound cleanser or normal saline prior to applying a clean dressing using gauze sponges, not tissue or cotton balls. Prim Dressing: Promogran Prisma Matrix, 4.34 (sq in) (silver collagen) (Home Health) Every Other Day/15 Days ary Discharge Instructions: Moisten collagen with [place] Jelly Secondary Dressing: Woven Gauze Sponge, Non-Sterile 4x4 in [facility]) (Generic) Every Other Day/15 Days Discharge Instructions: wet to dry packing the Prisma. Apply over primary dressing as directed. Secondary Dressing: ALLEVYN Heel 4 1/2in x 5 1/2in / 10.5cm x 13.5cm (Home Health) (Generic) Every Other Day/15 Days Discharge Instructions: Apply over primary dressing as directed. Secured With: [employer], 4.5x3.1 (in/yd) (Home Health) (Generic) Every Other Day/15 Days Discharge Instructions: Secure with Kerlix as directed. Secured With: Paper Tape, 2x10 (in/yd) (Home Health) (Generic) Every Other Day/15 Days Discharge Instructions: Secure dressing with tape as directed. Electronic Signature(s) Signed: 04/13/2021 6:03:14 PM By: Lorrin Jackson Signed: 04/14/2021 7:13:41 AM By: Linton Ham MD Previous Signature: 04/13/2021 1:10:22 PM Version By: Lorrin Jackson Entered By: Lorrin Jackson on 04/13/2021 13:25:38 -------------------------------------------------------------------------------- Problem List Details Patient Name: Date of Service: [name] Small. 04/13/2021 12:30 PM Medical Record Number: [id number] Patient Account Number: [id number] Date of Birth/Sex: Treating RN: [birth year] (87 y.o. [name] Small Primary Care Provider: Leonides Cave Other Clinician: Referring Provider: Treating Provider/Extender: Lina Sayre, Purcell Mouton Weeks in Treatment: 17 Active Problems ICD-10 Encounter Code Description Active Date MDM Diagnosis E11.621 Type 2 diabetes mellitus with foot ulcer 12/11/2020 No Yes L97.418 Non-pressure chronic ulcer of right heel and midfoot with  other specified 12/11/2020 No Yes severity E11.51 Type 2 diabetes mellitus with diabetic peripheral angiopathy without gangrene 12/11/2020 No Yes E11.42 Type 2 diabetes mellitus with diabetic polyneuropathy 12/11/2020 No Yes Inactive Problems ICD-10 Code Description Active Date Inactive Date L03.115 Cellulitis of right lower limb 02/06/2021 02/06/2021 Resolved Problems Electronic Signature(s) Signed: 04/14/2021 7:13:41 AM By: Linton Ham MD Previous Signature: 04/13/2021 1:09:11 PM Version By: Lorrin Jackson Entered By: Linton Ham on 04/13/2021 13:27:24 -------------------------------------------------------------------------------- Progress Note Details Patient Name: Date of Service: [name] Small. 04/13/2021 12:30 PM Medical Record Number: [id number] Patient Account Number: [id number] Date of Birth/Sex: Treating RN: 19-Sep-1928 (87 y.o. [name] Small Primary Care Provider: Leonides Cave Other Clinician: Referring Provider: Treating Provider/Extender: Earnest Conroy Weeks in Treatment: 17 Subjective History of Present Illness (HPI) ADMISSION 12/11/2020 This is a 87 year old man who apparently fractured his left hip in June and then had a subsequent fall and femur fracture requiring a separate surgery. He spent a long time at rehab at [place] skilled facility. Sometime in October his wife noted a blister on the left heel that is gradually morphed into a pressure ulcer. He saw his primary physician on 11/07/2020 and he was referred here. The patient had prior problems with PAD on the left. His last arterial studies in September 2020 showed an ABI on the right of 1.03 with a TBI of 0.73 and biphasic waveforms on the left a ABI of 0.84, TBI's were not done because the toe was surgically absent. He had monophasic and biphasic waveforms. In 2017 he had an angiogram showing a mom diffusely diseased SFA and occluded left popliteal he underwent a left popliteal  and left peroneal artery angioplasty. Noteworthy on the LEFT the peroneal was the only patent vessel below the knee. I do not think his  right side was actually studied Past medical history; type 2 diabetes on insulin with peripheral neuropathy, hypertension, hyperlipidemia, hip fracture and subsequent femur fracture is noted, and emphysema, history of bladder cancer requiring in and out catheterizations, coronary artery disease, BPH. Updated ABI in our clinic was 0.93 on the right 2/14 Santyl to continue. Mechanical debridement 2/21; using Santyl. Still requiring mechanical debridement today which was disappointing. 3/15; 3-week follow-up. Using Santyl but still requiring mechanical debridements in the same spot in this wound. Most of the rest of the surface of this wound looks satisfactory however in the most proximal part filled with mostly necrotic tissue. 3/29; 2-week follow-up. They have been using Santyl the patient's wife is changing the dressing as well as home health. Culture I did showed both Enterococcus and Klebsiella he was treated with 7 days of Augmentin but I am going to extend this. X-ray was negative for osteomyelitis 4/7; weekly follow-up. I changed him to silver collagen last week no real improvement here. He has completed the Augmentin. He still has really 2 wounds part of it filled then but a deeper part that is of probing area right to the bone itself or at least close to it. A lot of necrotic debris continues in this area. We ran Dermagraft the last time he was here however it is being discontinued. Moreover in consideration of a skin substitute, we would need to have something that could fold and be molded into the wound itself 4/14; weekly follow-up. Silver collagen. I am attempting to get epi fix to try and close down the superior divot. CT scan of this area did not show underlying osteomyelitis. In fact they mention that the wound does not go to bone. Certainly felt like  it did clinically however. Unfortunately to get epi fix we are going to need a hemoglobin A1c. His wife states they just had lab work done at the [place] and we will see if we can get a copy of the hemoglobin A1c. The patient says it was 8.1 4/28; patient presents for 2-week follow-up. He has been using silver collagen every other day to the right calcaneus. He has no complaints or issues today. He denies any fever/chills, purulent drainage, erythema or increased warmth to the foot 5/12; 2-week follow-up. She is using silver collagen. Epi fix has a $676 per application co-pay or thereabouts. Arrived in clinic with purulent drainage coming from the divot at 2:00 5/19; 1 week follow-up using silver collagen. Moderate drainage. Culture I did of this last week was negative. He is not on any antibiotics. 6/3; using silver collagen. Minimal drainage. He has home health they are coming once a week but most of the time his wife changes this. There were 2 parts to this wound a more superficial part however that is totally epithelialized now he has however a deeper tunnel roughly 0.9 cm deep today. This does not probe to bone Objective Constitutional Patient is hypotensive. However he appears well. Pulse regular and within target range for patient.Marland Kitchen Respirations regular, non-labored and within target range.. Temperature is normal and within the target range for the patient.Marland Kitchen Appears in no distress. Vitals Time Taken: 12:55 PM, Height: 74 in, Weight: 205 lbs, BMI: 26.3, Temperature: 98.1 F, Pulse: 74 bpm, Respiratory Rate: 17 breaths/min, Blood Pressure: 92/55 mmHg, Capillary Blood Glucose: 62 mg/dl. General Notes: Wound exam; right calcaneus at the tip. The superficial part of this is totally epithelialized however he has the remaining tunnel at 0.9 cm this does not  probe to bone there is no purulent drainage. There is no tenderness around this and no erythema. He does not have an arterial issue Integumentary  (Hair, Skin) Wound #1 status is Open. Original cause of wound was Pressure Injury. The date acquired was: 08/11/2020. The wound has been in treatment 17 weeks. The wound is located on the Right Calcaneus. The wound measures 0.3cm length x 0.8cm width x 0.9cm depth; 0.188cm^2 area and 0.17cm^3 volume. There is Fat Layer (Subcutaneous Tissue) exposed. There is no tunneling noted, however, there is undermining starting at 12:00 and ending at 12:00 with a maximum distance of 1cm. There is a medium amount of serosanguineous drainage noted. The wound margin is well defined and not attached to the wound base. There is large (67-100%) pink, pale granulation within the wound bed. There is a small (1-33%) amount of necrotic tissue within the wound bed. Assessment Active Problems ICD-10 Type 2 diabetes mellitus with foot ulcer Non-pressure chronic ulcer of right heel and midfoot with other specified severity Type 2 diabetes mellitus with diabetic peripheral angiopathy without gangrene Type 2 diabetes mellitus with diabetic polyneuropathy Plan Follow-up Appointments: Return Appointment in 2 weeks. - Dr. Dellia Nims Cellular or Tissue Based Products: Cellular or Tissue Based Product Type: - 04/13/21: Run insurance for Single Layer Oasis Epifix has been approved by Insurance underwriter. Will order when wound is ready. Bathing/ Shower/ Hygiene: May shower and wash wound with soap and water. - prior to dressing change Edema Control - Lymphedema / SCD / Other: Elevate legs to the level of the heart or above for 30 minutes daily and/or when sitting, a frequency of: Avoid standing for long periods of time. Off-Loading: Heel suspension boot to: - Gloped heel offloading sandal to right foot Turn and reposition every 2 hours Other: - float heels off of bed and chair by placing pillow under calves Home Health: No change in wound care orders this week; continue [place] for wound care. May utilize formulary equivalent dressing  for wound treatment orders unless otherwise specified. Other Home Health Orders/Instructions: - [facility] for wound care 2-3x a week. WOUND #1: - Calcaneus Wound Laterality: Right Cleanser: Wound Cleanser (Home Health) Every Other Day/15 Days Discharge Instructions: Cleanse the wound with wound cleanser or normal saline prior to applying a clean dressing using gauze sponges, not tissue or cotton balls. Prim Dressing: Promogran Prisma Matrix, 4.34 (sq in) (silver collagen) (Home Health) Every Other Day/15 Days ary Discharge Instructions: Moisten collagen with [place] Jelly Secondary Dressing: Woven Gauze Sponge, Non-Sterile 4x4 in [facility]) (Generic) Every Other Day/15 Days Discharge Instructions: wet to dry packing the Prisma. Apply over primary dressing as directed. Secondary Dressing: ALLEVYN Heel 4 1/2in x 5 1/2in / 10.5cm x 13.5cm (Home Health) (Generic) Every Other Day/15 Days Discharge Instructions: Apply over primary dressing as directed. Secured With: [employer], 4.5x3.1 (in/yd) (Home Health) (Generic) Every Other Day/15 Days Discharge Instructions: Secure with Kerlix as directed. Secured With: Paper T ape, 2x10 (in/yd) (Home Health) (Generic) Every Other Day/15 Days Discharge Instructions: Secure dressing with tape as directed. 1. I am still going with Prisma. I have asked him to use K-Y jelly to moisten which should last the 2 days 2. Home health limits my options of dressings. 3. If this area does not close then then I would like to have an advanced treatment option. The insurance already was unaffordable in terms of co-pay for epi fix I will try single layer Oasis. This would be easy to pack gently into  the wound bed. 4. I see no evidence of infection 5. There offloading this rigorously and I went over this with him again. Electronic Signature(s) Signed: 04/14/2021 7:13:41 AM By: Linton Ham MD Entered By: Linton Ham on 04/13/2021  13:31:23 -------------------------------------------------------------------------------- SuperBill Details Patient Name: Date of Service: [name] Small. 04/13/2021 Medical Record Number: [id number] Patient Account Number: [id number] Date of Birth/Sex: Treating RN: Sep 07, 1928 (87 y.o. [name] Small Primary Care Provider: Leonides Cave Other Clinician: Referring Provider: Treating Provider/Extender: Earnest Conroy Weeks in Treatment: 17 Diagnosis Coding ICD-10 Codes Code Description E11.621 Type 2 diabetes mellitus with foot ulcer L97.418 Non-pressure chronic ulcer of right heel and midfoot with other specified severity E11.51 Type 2 diabetes mellitus with diabetic peripheral angiopathy without gangrene E11.42 Type 2 diabetes mellitus with diabetic polyneuropathy Facility Procedures CPT4 Code: [id number] Description: 99213 - WOUND CARE VISIT-LEV 3 EST PT Modifier: Quantity: 1 Physician Procedures : CPT4 Code Description Modifier [id number] - WC PHYS LEVEL 3 - EST PT ICD-10 Diagnosis Description E11.621 Type 2 diabetes mellitus with foot ulcer L97.418 Non-pressure chronic ulcer of right heel and midfoot with other specified severity E11.51 Type  2 diabetes mellitus with diabetic peripheral angiopathy without gangrene Quantity: 1 Electronic Signature(s) Signed: 04/14/2021 7:13:41 AM By: Linton Ham MD Entered By: Linton Ham on 04/13/2021 13:31:41

## 2021-04-16 ENCOUNTER — Other Ambulatory Visit: Payer: Self-pay

## 2021-04-16 ENCOUNTER — Ambulatory Visit (INDEPENDENT_AMBULATORY_CARE_PROVIDER_SITE_OTHER): Payer: Medicare Other

## 2021-04-16 DIAGNOSIS — E1142 Type 2 diabetes mellitus with diabetic polyneuropathy: Secondary | ICD-10-CM | POA: Diagnosis not present

## 2021-04-16 DIAGNOSIS — I10 Essential (primary) hypertension: Secondary | ICD-10-CM | POA: Diagnosis not present

## 2021-04-16 DIAGNOSIS — Z794 Long term (current) use of insulin: Secondary | ICD-10-CM

## 2021-04-16 NOTE — Progress Notes (Signed)
Chronic Care Management Pharmacy Note  04/16/2021 Name:  [name] Small MRN:  [id number] DOB:  15-Sep-1928  Summary: Fasting BG 90-120 with 2 readings of 67, 72 in past 2 weeks.   Recommendations/Changes made from today's visit: Recommend reducing Lantus to 12 units daily when fasting BG is less than 100. Please call with any repeat episodes of BG < 70.  Plan: CCM Team call in 30 days for BG log   Subjective: [name] Small is an 87 y.o. year old male who is a primary patient of Damita Dunnings, Elveria Rising, MD.  The CCM team was consulted for assistance with disease management and care coordination needs.    Engaged with patient by telephone for follow up visit in response to provider referral for pharmacy case management and/or care coordination services. Spoke with patient's wife. She reports his ulcer on right heel is improving. A nurse is visiting in between wound care visits. No pain, but does limit his mobility. He is wearing a boot and trying to keep all pressure off his foot. He visited the [place] PCP in March 2022. Reports they changed his insulin and inhaler. Taking Lantus 15 units once daily. Added Symbicort PRN.  Consent to Services:  The patient was given information about Chronic Care Management services, agreed to services, and gave verbal consent prior to initiation of services.  Please see initial visit note for detailed documentation.   Patient Care Team: Tonia Ghent, MD as PCP - General (Family Medicine) Wellington Hampshire, MD as PCP - Cardiology (Cardiology) Wellington Hampshire, MD as Consulting Physician (Cardiology) Elmarie Shiley, MD as Consulting Physician (Nephrology) Pa, [facility] (Urology) Debbora Dus, [facility] as Pharmacist (Pharmacist) Ardis Hughs, MD as Consulting Physician (Urology)  Recent office visits: 03/25/21 - Patient call - Continue Flomax  03/23/21 - Dr. Damita Dunnings,  PCP - Diabetes, continue Lantus 15 units daily. Check with urology about  whether he needs to continue flomax.  11/07/20 - Dr. Glori Bickers - Large foot ulcer right heel, recommend elevation, referral to wound clinic  Recent consult visits: 04/13/21 - Wound care (every 2 weeks since 11/07/20 for foot ucler)  Hospital visits: None in previous 6 months   Objective:  Lab Results  Component Value Date   CREATININE 1.80 (H) 03/23/2021   BUN 41 (H) 03/23/2021   GFR 32.76 (L) 03/23/2021   GFRNONAA 42 (L) 08/07/2020   GFRAA 48 (L) 08/07/2020   NA 140 03/23/2021   K 4.8 03/23/2021   CALCIUM 8.9 03/23/2021   CO2 36 (H) 03/23/2021   GLUCOSE 144 (H) 03/23/2021    Lab Results  Component Value Date/Time   HGBA1C 8.3 (H) 03/23/2021 01:50 PM   HGBA1C 8.3 01/16/2021 12:00 AM   HGBA1C 8.6 (H) 07/20/2020 10:30 AM   GFR 32.76 (L) 03/23/2021 01:50 PM   GFR 35.42 (L) 07/20/2020 10:30 AM   MICROALBUR 1.1 10/11/2015 09:44 AM   MICROALBUR 1.5 08/01/2009 09:21 AM    Last diabetic Eye exam:  Lab Results  Component Value Date/Time   HMDIABEYEEXA No Retinopathy 06/26/2017 12:00 AM    Last diabetic Foot exam: 04/13/21 wound care    Lab Results  Component Value Date   CHOL 127 03/23/2021   HDL 45.00 03/23/2021   LDLCALC 65 03/23/2021   LDLDIRECT 147.6 04/27/2009   TRIG 83.0 03/23/2021   CHOLHDL 3 03/23/2021    Hepatic Function Latest Ref Rng & Units 03/23/2021 07/31/2020 06/15/2020  Total Protein 6.0 - 8.3 g/dL 6.9 7.4 7.0  Albumin 3.5 - 5.2 g/dL 3.8 3.3(L) 3.5  AST 0 - 37 U/L 12 14(L) 13  ALT 0 - 53 U/L 8 11 9   Alk Phosphatase 39 - 117 U/L 68 80 77  Total Bilirubin 0.2 - 1.2 mg/dL 0.4 0.5 0.4  Bilirubin, Direct <=0.2 mg/dL - - -    Lab Results  Component Value Date/Time   TSH 1.448 05/14/2020 04:23 PM   TSH 0.927 01/18/2014 05:00 AM   TSH 1.17 12/14/2012 11:30 AM    CBC Latest Ref Rng & Units 03/23/2021 01/16/2021 08/07/2020  WBC 4.0 - 10.5 K/uL 8.9 - 9.8  Hemoglobin 13.0 - 17.0 g/dL 14.8 13.7 10.6(L)  Hematocrit 39.0 - 52.0 % 44.8 - 33.5(L)  Platelets 150.0 -  400.0 K/uL 181.0 - 315    Lab Results  Component Value Date/Time   VD25OH 29.76 (L) 08/02/2020 09:52 AM   VD25OH 30.03 04/20/2020 04:17 AM    Clinical ASCVD: Yes  The ASCVD Risk score Mikey Bussing DC Jr., et al., 2013) failed to calculate for the following reasons:   The 2013 ASCVD risk score is only valid for ages 53 to 44    Depression screen PHQ 2/9 06/09/2020 05/26/2020  Decreased Interest 0 0  Down, Depressed, Hopeless 1 0  PHQ - 2 Score 1 0  Some recent data might be hidden    Social History   Tobacco Use  Smoking Status Former Smoker  . Packs/day: 2.00  . Years: 22.00  . Pack years: 44.00  . Types: Cigarettes  . Quit date: 11/11/1968  . Years since quitting: 52.4  Smokeless Tobacco Never Used   BP Readings from Last 3 Encounters:  03/23/21 138/62  11/07/20 122/64  09/04/20 (!) 132/56   Pulse Readings from Last 3 Encounters:  03/23/21 75  11/07/20 91  09/04/20 71   Wt Readings from Last 3 Encounters:  09/04/20 205 lb (93 kg)  08/05/20 216 lb 4.3 oz (98.1 kg)  06/09/20 (!) 210 lb (95.3 kg)   BMI Readings from Last 3 Encounters:  09/04/20 26.32 kg/m  08/05/20 27.77 kg/m  06/09/20 26.96 kg/m    Assessment/Interventions: Review of patient past medical history, allergies, medications, health status, including review of consultants reports, laboratory and other test data, was performed as part of comprehensive evaluation and provision of chronic care management services.   SDOH:  (Social Determinants of Health) assessments and interventions performed: Yes SDOH Interventions   Flowsheet Row Most Recent Value  SDOH Interventions   Financial Strain Interventions Intervention Not Indicated     SDOH Screenings   Alcohol Screen: Low Risk   . Last Alcohol Screening Score (AUDIT): 2  Depression (PHQ2-9): Low Risk   . PHQ-2 Score: 1  Financial Resource Strain: Low Risk   . Difficulty of Paying Living Expenses: Not very hard  Food Insecurity: No Food Insecurity  .  Worried About Charity fundraiser in the Last Year: Never true  . Ran Out of Food in the Last Year: Never true  Housing: Not on file  Physical Activity: Insufficiently Active  . Days of Exercise per Week: 7 days  . Minutes of Exercise per Session: 10 min  Social Connections: Not on file  Stress: Not on file  Tobacco Use: Medium Risk  . Smoking Tobacco Use: Former Smoker  . Smokeless Tobacco Use: Never Used  Transportation Needs: No Transportation Needs  . Lack of Transportation (Medical): No  . Lack of Transportation (Non-Medical): No    CCM Care Plan  Allergies  Allergen Reactions  . Actos [Pioglitazone Hydrochloride] Other (See Comments)    Held 2012 bladder cancer  . Aspirin Other (See Comments)    Held 2012 due to hematuria and bruising.   . Ace Inhibitors Cough  . Bactrim Rash and Other (See Comments)    Rash, presumed allergy  . Gabapentin Nausea And Vomiting    Upset stomach and diarrhea - tolerates low doses  . Lyrica [Pregabalin] Swelling    swelling  . Pravastatin Swelling    Swelling of feet  . Sulfa Drugs Cross Reactors Rash    Medications Reviewed Today    Reviewed by Debbora Dus, [facility] (Pharmacist) on 04/16/21 at [place] List Status: <None>  Medication Order Taking? Sig Documenting Provider Last Dose Status Informant  acetaminophen (TYLENOL) 325 MG tablet [id number] Yes Take 1-2 tablets (325-650 mg total) by mouth every 4 (four) hours as needed for mild pain. Cathlyn Parsons, PA-C Taking Active Spouse/Significant Other  Apremilast 30 MG TABS [id number] Yes Take 1 tablet by mouth daily. Tonia Ghent, MD Taking Active Spouse/Significant Other  atorvastatin (LIPITOR) 10 MG tablet [id number] Yes Take 1 tablet (10 mg total) by mouth daily. Angiulli, Lavon Paganini, PA-C Taking Active Spouse/Significant Other  calcium carbonate (OS-CAL - DOSED IN MG OF ELEMENTAL CALCIUM) 1250 (500 Ca) MG tablet [id number] No Take 1 tablet (500 mg of elemental calcium total) by mouth  daily with breakfast.  Patient not taking: Reported on 04/16/2021   Cathlyn Parsons, PA-C Not Taking Consider Medication Status and Discontinue Spouse/Significant Other  Cholecalciferol (VITAMIN D) 125 MCG (5000 UT) CAPS [id number] Yes Take 1 capsule by mouth daily. Ainsley Spinner, PA-C Taking Active   clobetasol (TEMOVATE) 0.05 % external solution [id number] Yes Apply 1 application topically daily as needed (rash).  [provider] Taking Active Spouse/Significant Other  CRANBERRY PO [id number] Yes Take 4 capsules by mouth daily. Takes AZO 2 tabs BID [provider] Taking Active Self  docusate sodium (COLACE) 100 MG capsule [id number] No Take 100 mg by mouth daily.  Patient not taking: Reported on 04/16/2021   [provider] Not Taking Consider Medication Status and Discontinue   doxycycline (ADOXA) 50 MG tablet [id number] Yes Take 50 mg by mouth daily. [provider] Taking Active   escitalopram (LEXAPRO) 5 MG tablet [id number] No Take 1 tablet (5 mg total) by mouth daily.  Patient not taking: Reported on 04/16/2021   Cathlyn Parsons, PA-C Not Taking Consider Medication Status and Discontinue Spouse/Significant Other  finasteride (PROSCAR) 5 MG tablet [id number] Yes Take 5 mg by mouth daily. [provider] Taking Active   fluticasone-salmeterol (ADVAIR) 250-50 MCG/ACT AEPB [id number] Yes Take by mouth 2 (two) times daily as needed. [provider] Taking Active   insulin glargine (LANTUS) 100 UNIT/ML Solostar Pen [id number] Yes Inject 15 Units into the skin daily. Tonia Ghent, MD Taking Active   loperamide (IMODIUM A-D) 2 MG tablet [id number] No Take 1 tablet (2 mg total) by mouth 4 (four) times daily as needed for diarrhea or loose stools.  Patient not taking: Reported on 04/16/2021   Tonia Ghent, MD Not Taking Consider Medication Status and Discontinue   Multiple Vitamin (MULTIVITAMIN WITH MINERALS) TABS tablet [id number] Yes Take 1 tablet by  mouth daily. Cathlyn Parsons, PA-C Taking Active Spouse/Significant Other  omeprazole (PRILOSEC) 40 MG capsule [id number] Yes TAKE 1 CAPSULE (40 MG TOTAL) BY MOUTH DAILY. Tonia Ghent, MD Taking Active   tamsulosin (FLOMAX) 0.4 MG CAPS  capsule [id number] Yes TAKE 2 CAPSULES BY MOUTH DAILY AFTER SUPPER. Tonia Ghent, MD Taking Active   tiotropium [facility]) 18 MCG inhalation capsule [id number] Yes Place 18 mcg into inhaler and inhale daily. [provider] Taking Active   traMADol (ULTRAM) 50 MG tablet [id number] Yes Take 1 tablet (50 mg total) by mouth every 8 (eight) hours as needed for moderate pain or severe pain. Ainsley Spinner, PA-C Taking Active   traZODone (DESYREL) 50 MG tablet [id number] Yes Take 0.5 tablets (25 mg total) by mouth at bedtime as needed for sleep. Tonia Ghent, MD Taking Active   Med List Note Cottie Banda, [place] 01/17/14 2022): [facility] [place], [place]          Patient Active Problem List   Diagnosis Date Noted  . Ulcer of foot ([place]) 11/07/2020  . Recurrent UTI 07/31/2020  . Insomnia 05/25/2020  . Advance care planning   . Gastroesophageal reflux disease with esophagitis without hemorrhage   . Ulcer of esophagus without bleeding   . Urinary retention with incomplete bladder emptying 05/10/2020  . Adjustment reaction of late life   . Acute kidney injury ([place])   . Gross hematuria   . Controlled type 2 diabetes mellitus with hyperglycemia, with long-term current use of insulin ([place])   . Benign essential HTN   . Anxiety 12/15/2019  . Anemia 01/31/2017  . Hypoxia   . Status post lumbar laminectomy 12/26/2016    Class: Chronic  . History of femur fracture 12/26/2016  . Idiopathic chronic venous hypertension of left lower extremity with inflammation 11/07/2016  . Spinal stenosis of lumbar region with neurogenic claudication 10/18/2016  . COPD (chronic obstructive pulmonary disease) ([place]) 06/10/2016  . Weakness generalized 03/18/2016  . Critical lower  limb ischemia ([place]) 02/28/2016  . Peripheral arterial disease ([place]) 10/17/2015  . Edema 07/19/2015  . Claudication ([place]) 01/31/2015  . DOE (dyspnea on exertion) 01/31/2015  . HTN (hypertension) 01/31/2015  . Chronic diastolic CHF (congestive heart failure) ([place]) 01/21/2014  . Atrial fibrillation ([place]) 12/16/2012  . Bladder cancer ([place]) 09/22/2011  . Leg cramps 08/28/2011  . Cough 05/26/2011  . CAD (coronary artery disease) of artery bypass graft 05/09/2011  . Dyspnea on exertion 04/19/2011  . PSORIASIS, SCALP 10/25/2008  . Chronic kidney disease, stage III (moderate) ([place]) 12/04/2007  . Type 2 diabetes mellitus with diabetic polyneuropathy ([place]) 05/21/2007  . DIVERTICULOSIS, COLON 05/21/2007  . BPH (benign prostatic hyperplasia) 05/21/2007    Immunization History  Administered Date(s) Administered  . Fluad Quad(high Dose 65+) 07/23/2019  . Influenza Split 08/27/2011, 09/15/2012  . Influenza Whole 08/21/2007, 08/08/2008, 08/30/2009, 07/24/2010  . Influenza, High Dose Seasonal PF 10/12/2015  . Influenza,inj,Quad PF,6+ Mos 07/27/2013, 07/19/2014, 07/18/2015, 08/02/2016, 08/15/2017, 08/13/2018  . Influenza-Unspecified 08/19/2001, 08/11/2002, 09/09/2003, 10/11/2004, 09/11/2013, 08/11/2014, 08/12/2019, 08/11/2020  . Moderna Sars-Covid-2 Vaccination 11/26/2019, 12/24/2019, 09/27/2020  . Pneumococcal Conjugate-13 01/10/2015, 02/07/2015  . Pneumococcal Polysaccharide-23 11/11/1998, 11/11/2013, 11/25/2013, 02/19/2016  . Td 04/07/2008, 12/06/2019  . Tdap 02/07/2015    Conditions to be addressed/monitored:  Hypertension and Diabetes  Care Plan : [place]  Updates made by Debbora Dus, [place] since 04/16/2021 12:00 AM    Problem: CHL AMB "PATIENT-SPECIFIC PROBLEM"     Long-Range Goal: Patient Stated   Start Date: 04/16/2021  Priority: High  Note:   Current Barriers:  . Two episodes of hypoglycemia in the past 2 weeks  Pharmacist Clinical Goal(s):  Marland Kitchen Patient will adhere  to plan to optimize therapeutic regimen for diabetes  as evidenced by report of adherence to recommended medication management changes through collaboration with PharmD and provider.   Interventions: . 1:1 collaboration with Tonia Ghent, MD regarding development and update of comprehensive plan of care as evidenced by provider attestation and co-signature . Inter-disciplinary care team collaboration (see longitudinal plan of care) . Comprehensive medication review performed; medication list updated in electronic medical record  Hypertension (BP goal <140/90) -Controlled - per home and clinic readings -Current treatment: . None  -Medications previously tried: amlodipine -Current home readings: [place] nurse checks weekly - 130/78 this past Saturday -Current dietary habits: reports decreased appetite, eats BF and lunch daily, fruit for evening snack, plans to start a protein drink per home health nurse recommendation this week  -Current exercise habits: unable  -Denies any falls  -Denies hypotensive/hypertensive symptoms -Educated on Symptoms of hypotension and importance of maintaining adequate hydration; -Counseled to monitor BP at home daily, document, and provide log at future appointments -Recommended to continue current medication  Diabetes (A1c goal < 8.5%) -Not ideally controlled -A1c 8.3%, but 2 recent episodes hypoglycemia  -Current medications:  Insulin glargine (LANTUS) vial - 15 units once daily  -Medications previously tried: none -Current home glucose readings - BG 72 this morning, felt okay; one morning last week BG was 62, drank orange juice . fasting glucose: usually 90-120 . post prandial glucose: none  -Denies hypoglycemic/hyperglycemic symptoms -Educated on Prevention and management of hypoglycemic episodes; Importance of avoiding hypoglycemia -Counseled to check feet daily and get yearly eye exams - due for eye exam. Reports patient is not concerned about his  vision at this time. They had eye exam scheduled at the [place] in the past year but unable to make appt. -Recommend reducing Lantus to 12 units when fasting BG is less than 100. Please call with any repeat episodes of BG < 70. Please schedule annual diabetic eye exam.  Patient Goals/Self-Care Activities . Patient will:  - check glucose daily, document, and provide at future appointments - call if any BG < 70  Follow Up Plan: The care management team will reach out to the patient again over the next 30 days.      Medication Assistance: None required.  Patient affirms current coverage meets needs.  Medication Rec: -No longer taking Lexapro. Reports mood has been stable off therapy. Unknown reason for d/c. -Add Wixela to med list, uses PRN -No longer taking calcium, docusate, or Imodium, denies diarrhea or constipation -Uses Tramadol sparingly, prior to doctors visits due to prolonged sitting  Compliance/Adherence/Medication fill history: Care Gaps: Annual diabetic eye and foot exam due Shingrix due PCV13 due COVID-19 Booster due  [facility] Drugs: Atorvastatin 10 mg - 02/12/2021 90 DS  Patient's preferred pharmacy is: Patient's preferred pharmacy is: [facility] - insulin, [facility] - all other medications   Uses pill box? Yes Pt endorses 100% compliance. Wife organizes his pillbox. Pt adjusts and administers his insulin on his own.   Care Plan and Follow Up Patient Decision:  Patient agrees to Care Plan and Follow-up.  Debbora Dus, PharmD Clinical Pharmacist [facility] at [facility] [phone]

## 2021-04-16 NOTE — Patient Instructions (Signed)
Dear [name] Small,  Below is a summary of the goals we discussed during our follow up appointment on April 16, 2021. Please contact me anytime with questions or concerns.   Visit Information  Patient Care Plan: [facility] Pharmacy Care Plan    Problem Identified: [facility] "PATIENT-SPECIFIC PROBLEM"     Long-Range Goal: Patient Stated   Start Date: 04/16/2021  Priority: High  Note:   Current Barriers:  . Two episodes of hypoglycemia in the past 2 weeks  Pharmacist Clinical Goal(s):  Marland Kitchen Patient will adhere to plan to optimize therapeutic regimen for diabetes as evidenced by report of adherence to recommended medication management changes through collaboration with PharmD and provider.   Interventions: . 1:1 collaboration with Tonia Ghent, MD regarding development and update of comprehensive plan of care as evidenced by provider attestation and co-signature . Inter-disciplinary care team collaboration (see longitudinal plan of care) . Comprehensive medication review performed; medication list updated in electronic medical record  Hypertension (BP goal <140/90) -Controlled - per home and clinic readings -Current treatment: . None  -Medications previously tried: amlodipine -Current home readings: [place] nurse checks weekly - 130/78 this past Saturday -Current dietary habits: reports decreased appetite, eats BF and lunch daily, fruit for evening snack, plans to start a protein drink per home health nurse recommendation this week  -Current exercise habits: unable  -Denies any falls  -Denies hypotensive/hypertensive symptoms -Educated on Symptoms of hypotension and importance of maintaining adequate hydration; -Counseled to monitor BP at home daily, document, and provide log at future appointments -Recommended to continue current medication  Diabetes (A1c goal < 8.5%) -Not ideally controlled -A1c 8.3%, but 2 recent episodes hypoglycemia  -Current medications:  Insulin glargine (LANTUS) vial  - 15 units once daily  -Medications previously tried: none -Current home glucose readings - BG 72 this morning, felt okay; one morning last week BG was 62, drank orange juice . fasting glucose: usually 90-120 . post prandial glucose: none  -Denies hypoglycemic/hyperglycemic symptoms -Educated on Prevention and management of hypoglycemic episodes; Importance of avoiding hypoglycemia -Counseled to check feet daily and get yearly eye exams - due for eye exam. Reports patient is not concerned about his vision at this time. They had eye exam scheduled at the [place] in the past year but unable to make appt. -Recommend reducing Lantus to 12 units when fasting BG is less than 100. Please call with any repeat episodes of BG < 70. Please schedule annual diabetic eye exam.  Patient Goals/Self-Care Activities . Patient will:  - check glucose daily, document, and provide at future appointments - call if any BG < 70  Follow Up Plan: The care management team will reach out to the patient again over the next 30 days.       The patient verbalized understanding of instructions, educational materials, and care plan provided today and agreed to receive a mailed copy of patient instructions, educational materials, and care plan.    Debbora Dus, PharmD Clinical Pharmacist [facility] [place] [phone]   Preventing Hypoglycemia Hypoglycemia occurs when the level of sugar (glucose) in the blood is too low. Hypoglycemia can happen in people who do or do not have diabetes (diabetes mellitus). It can develop quickly, and it can be a medical emergency. For most people with diabetes, a blood glucose level below 70 mg/dL (3.9 mmol/L) is considered hypoglycemia. Glucose is a type of sugar that provides the body's main source of energy. Certain hormones (insulin and glucagon) control  the level of glucose in the blood. Insulin lowers blood glucose, and glucagon increases blood glucose. Hypoglycemia  can result from having too much insulin in the bloodstream, or from not eating enough food that contains glucose. Your risk for hypoglycemia is higher:  If you take insulin or diabetes medicines to help lower your blood glucose or help your body make more insulin.  If you skip or delay a meal or snack.  If you are ill.  During and after exercise. You can prevent hypoglycemia by working with your health care provider to adjust your meal plan as needed and by taking other precautions. How can hypoglycemia affect me? Mild symptoms Mild hypoglycemia may not cause any symptoms. If you do have symptoms, they may include:  Hunger.  Anxiety.  Sweating and feeling clammy.  Dizziness or feeling light-headed.  Sleepiness.  Nausea.  Increased heart rate.  Headache.  Blurry vision.  Irritability.  Tingling or numbness around the mouth, lips, or tongue.  A change in coordination.  Restless sleep. If mild hypoglycemia is not recognized and treated, it can quickly become moderate or severe hypoglycemia. Moderate symptoms Moderate hypoglycemia can cause:  Mental confusion and poor judgment.  Behavior changes.  Weakness.  Irregular heartbeat. Severe symptoms Severe hypoglycemia is a medical emergency. It can cause:  Fainting.  Seizures.  Loss of consciousness (coma).  Death. What nutrition changes can be made?  Work with your health care provider or diet and nutrition specialist (dietitian) to make a healthy meal plan that is right for you. Follow your meal plan carefully.  Eat meals at regular times.  If recommended by your health care provider, have snacks between meals.  Donot skip or delay meals or snacks. You can be at risk for hypoglycemia if you are not getting enough carbohydrates. What lifestyle changes can be made?  Work closely with your health care provider to manage your blood glucose. Make sure you know: ? Your goal blood glucose levels. ? How and  when to check your blood glucose. ? The symptoms of hypoglycemia. It is important to treat it right away to keep it from becoming severe.  Do not drink alcohol on an empty stomach.  When you are ill, check your blood glucose more often than usual. Follow your sick day plan whenever you cannot eat or drink normally. Make this plan in advance with your health care provider.  Always check your blood glucose before, during, and after exercise.   How is this treated? This condition can often be treated by immediately eating or drinking something that contains sugar, such as:  Fruit juice, 4-6 oz (120-150 mL).  Regular (not diet) soda, 4-6 oz (120-150 mL).  Low-fat milk, 4 oz (120 mL).  Several pieces of hard candy.  Sugar or honey, 1 Tbsp (15 mL). Treating hypoglycemia if you have diabetes If you are alert and able to swallow safely, follow the 15:15 rule:  Take 15 grams of a rapid-acting carbohydrate. Talk with your health care provider about how much you should take.  Rapid-acting options include: ? Glucose pills (take 15 grams). ? 6-8 pieces of hard candy. ? 4-6 oz (120-150 mL) of fruit juice. ? 4-6 oz (120-150 mL) of regular (not diet) soda.  Check your blood glucose 15 minutes after you take the carbohydrate.  If the repeat blood glucose level is still at or below 70 mg/dL (3.9 mmol/L), take 15 grams of a carbohydrate again.  If your blood glucose level does not increase above  70 mg/dL (3.9 mmol/L) after 3 tries, seek emergency medical care.  After your blood glucose level returns to normal, eat a meal or a snack within 1 hour. Treating severe hypoglycemia Severe hypoglycemia is when your blood glucose level is at or below 54 mg/dL (3 mmol/L). Severe hypoglycemia is a medical emergency. Get medical help right away. If you have severe hypoglycemia and you cannot eat or drink, you may need an injection of glucagon. A family member or close friend should learn how to check your  blood glucose and how to give you a glucagon injection. Ask your health care provider if you need to have an emergency glucagon injection kit available. Severe hypoglycemia may need to be treated in a hospital. The treatment may include getting glucose through an IV. You may also need treatment for the cause of your hypoglycemia. Where to find more information  American Diabetes Association: www.diabetes.[employer] of Diabetes and Digestive and Kidney Diseases: [employer] Contact a health care provider if:  You have problems keeping your blood glucose in your target range.  You have frequent episodes of hypoglycemia. Get help right away if:  You continue to have hypoglycemia symptoms after eating or drinking something containing glucose.  Your blood glucose level is at or below 54 mg/dL (3 mmol/L).  You faint.  You have a seizure. These symptoms may represent a serious problem that is an emergency. Do not wait to see if the symptoms will go away. Get medical help right away. Call your local emergency services (911 in the [place] Summary  Know the symptoms of hypoglycemia, and when you are at risk for it (such as during exercise or when you are sick). Check your blood glucose often when you are at risk for hypoglycemia.  Hypoglycemia can develop quickly, and it can be dangerous if it is not treated right away. If you have a history of severe hypoglycemia, make sure you know how to use your glucagon injection kit.  Make sure you know how to treat hypoglycemia. Keep a carbohydrate snack available when you may be at risk for hypoglycemia. This information is not intended to replace advice given to you by your health care provider. Make sure you discuss any questions you have with your health care provider. Document Revised: 02/19/2019 Document Reviewed: 06/25/2017 Elsevier Patient Education  2021 [employer].

## 2021-04-16 NOTE — Progress Notes (Signed)
KAEGAN, HETTICH ([id number]) Visit Report for 04/13/2021 Arrival Information Details Patient Name: Date of Service: AKBAR, SACRA [place] RIO N. 04/13/2021 12:30 PM Medical Record Number: [id number] Patient Account Number: [id number] Date of Birth/Sex: Treating RN: 04-16-1928 (87 y.o. [name], [name] Primary Care Coran Dipaola: Leonides Cave Other Clinician: Referring Jing Howatt: Treating Adilynne Fitzwater/Extender: Earnest Conroy Weeks in Treatment: 73 Visit Information History Since Last Visit Added or deleted any medications: No Patient Arrived: Wheel Chair Any new allergies or adverse reactions: No Arrival Time: 12:54 Had a fall or experienced change in No Accompanied By: wife activities of daily living that may affect Transfer Assistance: None risk of falls: Patient Identification Verified: Yes Signs or symptoms of abuse/neglect since last visito No Secondary Verification Process Completed: Yes Hospitalized since last visit: No Patient Requires Transmission-Based Precautions: No Implantable device outside of the clinic excluding No Patient Has Alerts: No cellular tissue based products placed in the center since last visit: Has Dressing in Place as Prescribed: Yes Pain Present Now: No Electronic Signature(s) Signed: 04/16/2021 5:37:32 PM By: Rhae Hammock RN Entered By: Rhae Hammock on 04/13/2021 12:55:27 -------------------------------------------------------------------------------- Clinic Level of Care Assessment Details Patient Name: Date of Service: KASAI, BELTRAN [place] RIO N. 04/13/2021 12:30 PM Medical Record Number: [id number] Patient Account Number: [id number] Date of Birth/Sex: Treating RN: [birth year] (87 y.o. [name] Primary Care Katherin Ramey: Leonides Cave Other Clinician: Referring Lupe Bonner: Treating Hayes Czaja/Extender: Earnest Conroy Weeks in Treatment: 17 Clinic Level of Care Assessment Items TOOL 4 Quantity Score X-  1 0 Use when only an EandM is performed on FOLLOW-UP visit ASSESSMENTS - Nursing Assessment / Reassessment X- 1 10 Reassessment of Co-morbidities (includes updates in patient status) X- 1 5 Reassessment of Adherence to Treatment Plan ASSESSMENTS - Wound and Skin A ssessment / Reassessment X - Simple Wound Assessment / Reassessment - one wound 1 5 []  - 0 Complex Wound Assessment / Reassessment - multiple wounds []  - 0 Dermatologic / Skin Assessment (not related to wound area) ASSESSMENTS - Focused Assessment []  - 0 Circumferential Edema Measurements - multi extremities []  - 0 Nutritional Assessment / Counseling / Intervention []  - 0 Lower Extremity Assessment (monofilament, tuning fork, pulses) []  - 0 Peripheral Arterial Disease Assessment (using hand held doppler) ASSESSMENTS - Ostomy and/or Continence Assessment and Care []  - 0 Incontinence Assessment and Management []  - 0 Ostomy Care Assessment and Management (repouching, etc.) PROCESS - Coordination of Care []  - 0 Simple Patient / Family Education for ongoing care X- 1 20 Complex (extensive) Patient / Family Education for ongoing care []  - 0 Staff obtains Programmer, systems, Records, T Results / Process Orders est X- 1 10 Staff telephones HHA, Nursing Homes / Clarify orders / etc []  - 0 Routine Transfer to another Facility (non-emergent condition) []  - 0 Routine Hospital Admission (non-emergent condition) []  - 0 New Admissions / Biomedical engineer / Ordering NPWT Apligraf, etc. , []  - 0 Emergency Hospital Admission (emergent condition) []  - 0 Simple Discharge Coordination []  - 0 Complex (extensive) Discharge Coordination PROCESS - Special Needs []  - 0 Pediatric / Minor Patient Management []  - 0 Isolation Patient Management []  - 0 Hearing / Language / Visual special needs []  - 0 Assessment of Community assistance (transportation, D/C planning, etc.) []  - 0 Additional assistance / Altered mentation []  -  0 Support Surface(s) Assessment (bed, cushion, seat, etc.) INTERVENTIONS - Wound Cleansing / Measurement X - Simple Wound Cleansing - one wound 1 5 []  - 0 Complex  Wound Cleansing - multiple wounds X- 1 5 Wound Imaging (photographs - any number of wounds) []  - 0 Wound Tracing (instead of photographs) X- 1 5 Simple Wound Measurement - one wound []  - 0 Complex Wound Measurement - multiple wounds INTERVENTIONS - Wound Dressings []  - 0 Small Wound Dressing one or multiple wounds X- 1 15 Medium Wound Dressing one or multiple wounds []  - 0 Large Wound Dressing one or multiple wounds []  - 0 Application of Medications - topical []  - 0 Application of Medications - injection INTERVENTIONS - Miscellaneous []  - 0 External ear exam []  - 0 Specimen Collection (cultures, biopsies, blood, body fluids, etc.) []  - 0 Specimen(s) / Culture(s) sent or taken to Lab for analysis []  - 0 Patient Transfer (multiple staff / Civil Service fast streamer / Similar devices) []  - 0 Simple Staple / Suture removal (25 or less) []  - 0 Complex Staple / Suture removal (26 or more) []  - 0 Hypo / Hyperglycemic Management (close monitor of Blood Glucose) []  - 0 Ankle / Brachial Index (ABI) - do not check if billed separately X- 1 5 Vital Signs Has the patient been seen at the hospital within the last three years: Yes Total Score: 85 Level Of Care: New/Established - Level 3 Electronic Signature(s) Signed: 04/13/2021 6:03:14 PM By: Lorrin Jackson Entered By: Lorrin Jackson on 04/13/2021 13:24:23 -------------------------------------------------------------------------------- Lower Extremity Assessment Details Patient Name: Date of Service: [name] 04/13/2021 12:30 PM Medical Record Number: [id number] Patient Account Number: [id number] Date of Birth/Sex: Treating RN: February 23, 1928 (87 y.o. [name], [name] Primary Care Blaire Hodsdon: Leonides Cave Other Clinician: Referring Jackson Coffield: Treating  Yobana Culliton/Extender: Earnest Conroy Weeks in Treatment: 17 Edema Assessment Assessed: Shirlyn Goltz: No] Patrice Paradise: Yes] Edema: [Left: N] [Right: o] Calf Left: Right: Point of Measurement: 33 cm From Medial Instep 33.5 cm Ankle Left: Right: Point of Measurement: 12 cm From Medial Instep 22 cm Vascular Assessment Pulses: Dorsalis Pedis Palpable: [Right:Yes] Posterior Tibial Palpable: [Right:Yes] Electronic Signature(s) Signed: 04/16/2021 5:37:32 PM By: Rhae Hammock RN Entered By: Rhae Hammock on 04/13/2021 13:00:21 -------------------------------------------------------------------------------- Multi Wound Chart Details Patient Name: Date of Service: [name] 04/13/2021 12:30 PM Medical Record Number: [id number] Patient Account Number: [id number] Date of Birth/Sex: Treating RN: [birth year] (87 y.o. [name] Primary Care Mishaal Lansdale: Leonides Cave Other Clinician: Referring Maury Groninger: Treating Taishawn Smaldone/Extender: Earnest Conroy Weeks in Treatment: 17 Vital Signs Height(in): 74 Capillary Blood Glucose(mg/dl): 20 Weight(lbs): 205 Pulse(bpm): 60 Body Mass Index(BMI): 26 Blood Pressure(mmHg): 92/55 Temperature(F): 98.1 Respiratory Rate(breaths/min): 17 Photos: [1:No Photos Right Calcaneus] [N/A:N/A N/A] Wound Location: [1:Pressure Injury] [N/A:N/A] Wounding Event: [1:Diabetic Wound/Ulcer of the Lower] [N/A:N/A] Primary Etiology: [1:Extremity Pressure Ulcer] [N/A:N/A] Secondary Etiology: [1:Chronic Obstructive Pulmonary] [N/A:N/A] Comorbid History: [1:Disease (COPD), Congestive Heart Failure, Coronary Artery Disease, Hypertension, Peripheral Arterial Disease, Type II Diabetes, Neuropathy 08/11/2020] [N/A:N/A] Date Acquired: [1:17] [N/A:N/A] Weeks of Treatment: [1:Open] [N/A:N/A] Wound Status: [1:0.3x0.8x0.9] [N/A:N/A] Measurements L x W x D (cm) [1:0.188] [N/A:N/A] A (cm) : rea [1:0.17] [N/A:N/A] Volume (cm) :  [1:97.40%] [N/A:N/A] % Reduction in A rea: [1:76.60%] [N/A:N/A] % Reduction in Volume: [1:12] Starting Position 1 (o'clock): [1:12] Ending Position 1 (o'clock): [1:1] Maximum Distance 1 (cm): [1:Yes] [N/A:N/A] Undermining: [1:Grade 2] [N/A:N/A] Classification: [1:Medium] [N/A:N/A] Exudate A mount: [1:Serosanguineous] [N/A:N/A] Exudate Type: [1:red, brown] [N/A:N/A] Exudate Color: [1:Well defined, not attached] [N/A:N/A] Wound Margin: [1:Large (67-100%)] [N/A:N/A] Granulation A mount: [1:Pink, Pale] [N/A:N/A] Granulation Quality: [1:Small (1-33%)] [N/A:N/A] Necrotic A mount: [1:Fat Layer (Subcutaneous Tissue):  Yes N/A] Exposed Structures: [1:Fascia: No Tendon: No Muscle: No Joint: No Bone: No Medium (34-66%)] [N/A:N/A] Treatment Notes Electronic Signature(s) Signed: 04/13/2021 6:03:14 PM By: Lorrin Jackson Signed: 04/14/2021 7:13:41 AM By: Linton Ham MD Entered By: Linton Ham on 04/13/2021 13:27:31 -------------------------------------------------------------------------------- Multi-Disciplinary Care Plan Details Patient Name: Date of Service: [name] 04/13/2021 12:30 PM Medical Record Number: [id number] Patient Account Number: [id number] Date of Birth/Sex: Treating RN: [birth year] (87 y.o. [name] Primary Care Melbert Botelho: Leonides Cave Other Clinician: Referring Seraphim Affinito: Treating Whyatt Klinger/Extender: Veneta Penton in Treatment: [place] reviewed with physician Active Inactive Nutrition Nursing Diagnoses: Impaired glucose control: actual or potential Potential for alteratiion in Nutrition/Potential for imbalanced nutrition Goals: Patient/caregiver agrees to and verbalizes understanding of need to use nutritional supplements and/or vitamins as prescribed Date Initiated: 12/11/2020 Date Inactivated: 02/15/2021 Target Resolution Date: 03/09/2021 Goal Status: Met Patient/caregiver will maintain  therapeutic glucose control Date Initiated: 12/11/2020 Target Resolution Date: 05/11/2021 Goal Status: Active Interventions: Assess HgA1c results as ordered upon admission and as needed Assess patient nutrition upon admission and as needed per policy Provide education on elevated blood sugars and impact on wound healing Provide education on nutrition Treatment Activities: Education provided on Nutrition : 03/08/2021 Notes: Pressure Nursing Diagnoses: Knowledge deficit related to causes and risk factors for pressure ulcer development Knowledge deficit related to management of pressures ulcers Potential for impaired tissue integrity related to pressure, friction, moisture, and shear Goals: Patient/caregiver will verbalize risk factors for pressure ulcer development Date Initiated: 12/11/2020 Target Resolution Date: 05/11/2021 Goal Status: Active Patient/caregiver will verbalize understanding of pressure ulcer management Date Initiated: 12/11/2020 Date Inactivated: 02/15/2021 Target Resolution Date: 02/25/2021 Goal Status: Met Interventions: Assess: immobility, friction, shearing, incontinence upon admission and as needed Assess offloading mechanisms upon admission and as needed Assess potential for pressure ulcer upon admission and as needed Provide education on pressure ulcers Notes: Electronic Signature(s) Signed: 04/13/2021 1:10:52 PM By: Lorrin Jackson Entered By: Lorrin Jackson on 04/13/2021 13:10:51 -------------------------------------------------------------------------------- Pain Assessment Details Patient Name: Date of Service: [name] 04/13/2021 12:30 PM Medical Record Number: [id number] Patient Account Number: [id number] Date of Birth/Sex: Treating RN: [birth year] (87 y.o. [name], [name] Primary Care [place]: Leonides Cave Other Clinician: Referring Latonya Nelon: Treating Ayriel Texidor/Extender: Earnest Conroy Weeks in Treatment:  17 Active Problems Location of Pain Severity and Description of Pain Patient Has Paino No Site Locations Pain Management and Medication Current Pain Management: Electronic Signature(s) Signed: 04/16/2021 5:37:32 PM By: Rhae Hammock RN Entered By: Rhae Hammock on 04/13/2021 12:59:22 -------------------------------------------------------------------------------- Patient/Caregiver Education Details Patient Name: Date of Service: Enid Baas 6/3/2022andnbsp12:30 PM Medical Record Number: [id number] Patient Account Number: [id number] Date of Birth/Gender: Treating RN: [birth year] (87 y.o. [name] Primary Care Physician: Leonides Cave Other Clinician: Referring Physician: Treating Physician/Extender: Veneta Penton in Treatment: 17 Education Assessment Education Provided To: Patient Education Topics Provided Elevated Blood Sugar/ Impact on Healing: Methods: Explain/Verbal Responses: State content correctly Offloading: Methods: Explain/Verbal, Printed Responses: State content correctly Pressure: Methods: Explain/Verbal, Printed Responses: State content correctly Wound/Skin Impairment: Methods: Explain/Verbal, Printed Responses: State content correctly Electronic Signature(s) Signed: 04/13/2021 6:03:14 PM By: Lorrin Jackson Entered By: Lorrin Jackson on 04/13/2021 13:11:26 -------------------------------------------------------------------------------- Wound Assessment Details Patient Name: Date of Service: [name] 04/13/2021 12:30 PM Medical Record Number: [id number] Patient Account Number: [id number] Date of Birth/Sex: Treating RN: 16-Sep-1928 (87 y.o. [name] Primary Care Cai Flott:  Leonides Cave Other Clinician: Referring Pancho Rushing: Treating Treana Lacour/Extender: Earnest Conroy Weeks in Treatment: 17 Wound Status Wound Number: 1 Primary Diabetic Wound/Ulcer of the  Lower Extremity Etiology: Wound Location: Right Calcaneus Secondary Pressure Ulcer Wounding Event: Pressure Injury Etiology: Date Acquired: 08/11/2020 Wound Open Weeks Of Treatment: 17 Status: Clustered Wound: No Comorbid Chronic Obstructive Pulmonary Disease (COPD), Congestive History: Heart Failure, Coronary Artery Disease, Hypertension, Peripheral Arterial Disease, Type II Diabetes, Neuropathy Photos Wound Measurements Length: (cm) 0.3 Width: (cm) 0.8 Depth: (cm) 0.9 Area: (cm) 0.188 Volume: (cm) 0.17 % Reduction in Area: 97.4% % Reduction in Volume: 76.6% Epithelialization: Medium (34-66%) Tunneling: No Undermining: Yes Starting Position (o'clock): 12 Ending Position (o'clock): 12 Maximum Distance: (cm) 1 Wound Description Classification: Grade 2 Wound Margin: Well defined, not attached Exudate Amount: Medium Exudate Type: Serosanguineous Exudate Color: red, brown Foul Odor After Cleansing: No Slough/Fibrino Yes Wound Bed Granulation Amount: Large (67-100%) Exposed Structure Granulation Quality: Pink, Pale Fascia Exposed: No Necrotic Amount: Small (1-33%) Fat Layer (Subcutaneous Tissue) Exposed: Yes Tendon Exposed: No Muscle Exposed: No Joint Exposed: No Bone Exposed: No Electronic Signature(s) Signed: 04/13/2021 5:48:41 PM By: Sandre Kitty Signed: 04/13/2021 6:03:14 PM By: Lorrin Jackson Entered By: Sandre Kitty on 04/13/2021 17:33:39 -------------------------------------------------------------------------------- [place] Details Patient Name: Date of Service: [name] 04/13/2021 12:30 PM Medical Record Number: [id number] Patient Account Number: [id number] Date of Birth/Sex: Treating RN: [birth year] (87 y.o. [name], [name] Primary Care Chirstopher Iovino: Leonides Cave Other Clinician: Referring Jewel Mcafee: Treating Jordin Vicencio/Extender: Earnest Conroy Weeks in Treatment: 17 Vital Signs Time Taken: 12:55 Temperature  (F): 98.1 Height (in): 74 Pulse (bpm): 74 Weight (lbs): 205 Respiratory Rate (breaths/min): 17 Body Mass Index (BMI): 26.3 Blood Pressure (mmHg): 92/55 Capillary Blood Glucose (mg/dl): 62 Reference Range: 80 - 120 mg / dl Electronic Signature(s) Signed: 04/16/2021 5:37:32 PM By: Rhae Hammock RN Entered By: Rhae Hammock on 04/13/2021 12:59:16

## 2021-04-18 DIAGNOSIS — Z8551 Personal history of malignant neoplasm of bladder: Secondary | ICD-10-CM | POA: Diagnosis not present

## 2021-04-18 DIAGNOSIS — J439 Emphysema, unspecified: Secondary | ICD-10-CM | POA: Diagnosis not present

## 2021-04-18 DIAGNOSIS — I110 Hypertensive heart disease with heart failure: Secondary | ICD-10-CM | POA: Diagnosis not present

## 2021-04-18 DIAGNOSIS — Z9181 History of falling: Secondary | ICD-10-CM | POA: Diagnosis not present

## 2021-04-18 DIAGNOSIS — E785 Hyperlipidemia, unspecified: Secondary | ICD-10-CM | POA: Diagnosis not present

## 2021-04-18 DIAGNOSIS — E11621 Type 2 diabetes mellitus with foot ulcer: Secondary | ICD-10-CM | POA: Diagnosis not present

## 2021-04-18 DIAGNOSIS — E1142 Type 2 diabetes mellitus with diabetic polyneuropathy: Secondary | ICD-10-CM | POA: Diagnosis not present

## 2021-04-18 DIAGNOSIS — L97412 Non-pressure chronic ulcer of right heel and midfoot with fat layer exposed: Secondary | ICD-10-CM | POA: Diagnosis not present

## 2021-04-18 DIAGNOSIS — E1151 Type 2 diabetes mellitus with diabetic peripheral angiopathy without gangrene: Secondary | ICD-10-CM | POA: Diagnosis not present

## 2021-04-18 DIAGNOSIS — I509 Heart failure, unspecified: Secondary | ICD-10-CM | POA: Diagnosis not present

## 2021-04-20 DIAGNOSIS — E1151 Type 2 diabetes mellitus with diabetic peripheral angiopathy without gangrene: Secondary | ICD-10-CM | POA: Diagnosis not present

## 2021-04-20 DIAGNOSIS — E785 Hyperlipidemia, unspecified: Secondary | ICD-10-CM | POA: Diagnosis not present

## 2021-04-20 DIAGNOSIS — E11621 Type 2 diabetes mellitus with foot ulcer: Secondary | ICD-10-CM | POA: Diagnosis not present

## 2021-04-20 DIAGNOSIS — Z9181 History of falling: Secondary | ICD-10-CM | POA: Diagnosis not present

## 2021-04-20 DIAGNOSIS — Z8551 Personal history of malignant neoplasm of bladder: Secondary | ICD-10-CM | POA: Diagnosis not present

## 2021-04-20 DIAGNOSIS — I509 Heart failure, unspecified: Secondary | ICD-10-CM | POA: Diagnosis not present

## 2021-04-20 DIAGNOSIS — I110 Hypertensive heart disease with heart failure: Secondary | ICD-10-CM | POA: Diagnosis not present

## 2021-04-20 DIAGNOSIS — L97412 Non-pressure chronic ulcer of right heel and midfoot with fat layer exposed: Secondary | ICD-10-CM | POA: Diagnosis not present

## 2021-04-20 DIAGNOSIS — J439 Emphysema, unspecified: Secondary | ICD-10-CM | POA: Diagnosis not present

## 2021-04-20 DIAGNOSIS — E1142 Type 2 diabetes mellitus with diabetic polyneuropathy: Secondary | ICD-10-CM | POA: Diagnosis not present

## 2021-04-22 DIAGNOSIS — R062 Wheezing: Secondary | ICD-10-CM | POA: Diagnosis not present

## 2021-04-22 DIAGNOSIS — J9600 Acute respiratory failure, unspecified whether with hypoxia or hypercapnia: Secondary | ICD-10-CM | POA: Diagnosis not present

## 2021-04-22 DIAGNOSIS — I509 Heart failure, unspecified: Secondary | ICD-10-CM | POA: Diagnosis not present

## 2021-04-22 DIAGNOSIS — J449 Chronic obstructive pulmonary disease, unspecified: Secondary | ICD-10-CM | POA: Diagnosis not present

## 2021-04-23 DIAGNOSIS — Z9181 History of falling: Secondary | ICD-10-CM | POA: Diagnosis not present

## 2021-04-23 DIAGNOSIS — L97412 Non-pressure chronic ulcer of right heel and midfoot with fat layer exposed: Secondary | ICD-10-CM | POA: Diagnosis not present

## 2021-04-23 DIAGNOSIS — J439 Emphysema, unspecified: Secondary | ICD-10-CM | POA: Diagnosis not present

## 2021-04-23 DIAGNOSIS — I110 Hypertensive heart disease with heart failure: Secondary | ICD-10-CM | POA: Diagnosis not present

## 2021-04-23 DIAGNOSIS — E11621 Type 2 diabetes mellitus with foot ulcer: Secondary | ICD-10-CM | POA: Diagnosis not present

## 2021-04-23 DIAGNOSIS — E785 Hyperlipidemia, unspecified: Secondary | ICD-10-CM | POA: Diagnosis not present

## 2021-04-23 DIAGNOSIS — E1142 Type 2 diabetes mellitus with diabetic polyneuropathy: Secondary | ICD-10-CM | POA: Diagnosis not present

## 2021-04-23 DIAGNOSIS — E1151 Type 2 diabetes mellitus with diabetic peripheral angiopathy without gangrene: Secondary | ICD-10-CM | POA: Diagnosis not present

## 2021-04-23 DIAGNOSIS — Z8551 Personal history of malignant neoplasm of bladder: Secondary | ICD-10-CM | POA: Diagnosis not present

## 2021-04-23 DIAGNOSIS — I509 Heart failure, unspecified: Secondary | ICD-10-CM | POA: Diagnosis not present

## 2021-04-27 ENCOUNTER — Other Ambulatory Visit: Payer: Self-pay

## 2021-04-27 ENCOUNTER — Encounter (HOSPITAL_BASED_OUTPATIENT_CLINIC_OR_DEPARTMENT_OTHER): Payer: Medicare Other | Admitting: Internal Medicine

## 2021-04-27 DIAGNOSIS — E1151 Type 2 diabetes mellitus with diabetic peripheral angiopathy without gangrene: Secondary | ICD-10-CM | POA: Diagnosis not present

## 2021-04-27 DIAGNOSIS — E1142 Type 2 diabetes mellitus with diabetic polyneuropathy: Secondary | ICD-10-CM | POA: Diagnosis not present

## 2021-04-27 DIAGNOSIS — L89629 Pressure ulcer of left heel, unspecified stage: Secondary | ICD-10-CM | POA: Diagnosis not present

## 2021-04-27 DIAGNOSIS — Z8551 Personal history of malignant neoplasm of bladder: Secondary | ICD-10-CM | POA: Diagnosis not present

## 2021-04-27 DIAGNOSIS — L89619 Pressure ulcer of right heel, unspecified stage: Secondary | ICD-10-CM | POA: Diagnosis not present

## 2021-04-27 NOTE — Progress Notes (Signed)
[name] Small, [name] Small ([id number]) Visit Report for 04/27/2021 HPI Details Patient Name: Date of Service: NYZIR, DUBOIS [place] RIO N. 04/27/2021 12:30 PM Medical Record Number: [id number] Patient Account Number: [id number] Date of Birth/Sex: Treating RN: 12-09-1927 (87 y.o. [name] Primary Care Provider: Leonides Cave Other Clinician: Referring Provider: Treating Provider/Extender: Earnest Conroy Weeks in Treatment: 19 History of Present Illness HPI Description: ADMISSION 12/11/2020 This is a 87 year old man who apparently fractured his left hip in June and then had a subsequent fall and femur fracture requiring a separate surgery. He spent a long time at rehab at [place] skilled facility. Sometime in October his wife noted a blister on the left heel that is gradually morphed into a pressure ulcer. He saw his primary physician on 11/07/2020 and he was referred here. The patient had prior problems with PAD on the left. His last arterial studies in September 2020 showed an ABI on the right of 1.03 with a TBI of 0.73 and biphasic waveforms on the left a ABI of 0.84, TBI's were not done because the toe was surgically absent. He had monophasic and biphasic waveforms. In 2017 he had an angiogram showing a mom diffusely diseased SFA and occluded left popliteal he underwent a left popliteal and left peroneal artery angioplasty. Noteworthy on the LEFT the peroneal was the only patent vessel below the knee. I do not think his right side was actually studied Past medical history; type 2 diabetes on insulin with peripheral neuropathy, hypertension, hyperlipidemia, hip fracture and subsequent femur fracture is noted, and emphysema, history of bladder cancer requiring in and out catheterizations, coronary artery disease, BPH. Updated ABI in our clinic was 0.93 on the right 2/14 Santyl to continue. Mechanical debridement 2/21; using Santyl. Still requiring mechanical debridement today  which was disappointing. 3/15; 3-week follow-up. Using Santyl but still requiring mechanical debridements in the same spot in this wound. Most of the rest of the surface of this wound looks satisfactory however in the most proximal part filled with mostly necrotic tissue. 3/29; 2-week follow-up. They have been using Santyl the patient's wife is changing the dressing as well as home health. Culture I did showed both Enterococcus and Klebsiella he was treated with 7 days of Augmentin but I am going to extend this. X-ray was negative for osteomyelitis 4/7; weekly follow-up. I changed him to silver collagen last week no real improvement here. He has completed the Augmentin. He still has really 2 wounds part of it filled then but a deeper part that is of probing area right to the bone itself or at least close to it. A lot of necrotic debris continues in this area. We ran Dermagraft the last time he was here however it is being discontinued. Moreover in consideration of a skin substitute, we would need to have something that could fold and be molded into the wound itself 4/14; weekly follow-up. Silver collagen. I am attempting to get epi fix to try and close down the superior divot. CT scan of this area did not show underlying osteomyelitis. In fact they mention that the wound does not go to bone. Certainly felt like it did clinically however. Unfortunately to get epi fix we are going to need a hemoglobin A1c. His wife states they just had lab work done at the [place] and we will see if we can get a copy of the hemoglobin A1c. The patient says it was 8.1 4/28; patient presents for 2-week follow-up. He has been using silver  collagen every other day to the right calcaneus. He has no complaints or issues today. He denies any fever/chills, purulent drainage, erythema or increased warmth to the foot 5/12; 2-week follow-up. She is using silver collagen. Epi fix has a $409 per application co-pay or thereabouts. Arrived  in clinic with purulent drainage coming from the divot at 2:00 5/19; 1 week follow-up using silver collagen. Moderate drainage. Culture I did of this last week was negative. He is not on any antibiotics. 6/3; using silver collagen. Minimal drainage. He has home health they are coming once a week but most of the time his wife changes this. There were 2 parts to this wound a more superficial part however that is totally epithelialized now he has however a deeper tunnel roughly 0.9 cm deep today. This does not probe to bone 6/17; patient arrives in clinic today and the area had eschar on the surface which I removed there is no open area although there is some divots here that are fully epithelialized. The result is skin right against bone Electronic Signature(s) Signed: 04/27/2021 5:27:23 PM By: Linton Ham MD Entered By: Linton Ham on 04/27/2021 13:16:46 -------------------------------------------------------------------------------- Physical Exam Details Patient Name: Date of Service: [name] 04/27/2021 12:30 PM Medical Record Number: [id number] Patient Account Number: [id number] Date of Birth/Sex: Treating RN: 1928/01/23 (87 y.o. [name] Primary Care Provider: Leonides Cave Other Clinician: Referring Provider: Treating Provider/Extender: Earnest Conroy Weeks in Treatment: 13 Constitutional Patient is hypertensive.. Pulse regular and within target range for patient.Marland Kitchen Respirations regular, non-labored and within target range.. Temperature is normal and within the target range for the patient.Marland Kitchen Appears in no distress. Notes Exam; right calcaneus at the tip the superficial part of this is totally epithelialized. He has a divot however under illumination this is epithelialized. There is no evidence of surrounding infection Electronic Signature(s) Signed: 04/27/2021 5:27:23 PM By: Linton Ham MD Entered By: Linton Ham on  04/27/2021 13:17:44 -------------------------------------------------------------------------------- Physician Orders Details Patient Name: Date of Service: [name] 04/27/2021 12:30 PM Medical Record Number: [id number] Patient Account Number: [id number] Date of Birth/Sex: Treating RN: [birth year] (87 y.o. [name] Primary Care Provider: Leonides Cave Other Clinician: Referring Provider: Treating Provider/Extender: Earnest Conroy Weeks in Treatment: 19 Verbal / Phone Orders: No Diagnosis Coding ICD-10 Coding Code Description E11.621 Type 2 diabetes mellitus with foot ulcer L97.418 Non-pressure chronic ulcer of right heel and midfoot with other specified severity E11.51 Type 2 diabetes mellitus with diabetic peripheral angiopathy without gangrene E11.42 Type 2 diabetes mellitus with diabetic polyneuropathy Discharge From [facility] Discharge from [place] - No further follow up needed. -Keep pressure off newly healed area on heel. -Apply padding/heel cup -Moisturize with lotion Off-Loading Turn and reposition every 2 hours [facility] for wound care. - [facility] Electronic Signature(s) Signed: 04/27/2021 5:27:23 PM By: Linton Ham MD Signed: 04/27/2021 5:38:00 PM By: Lorrin Jackson Previous Signature: 04/27/2021 12:55:05 PM Version By: Lorrin Jackson Entered By: Lorrin Jackson on 04/27/2021 13:12:21 -------------------------------------------------------------------------------- Problem List Details Patient Name: Date of Service: [name] 04/27/2021 12:30 PM Medical Record Number: [id number] Patient Account Number: [id number] Date of Birth/Sex: Treating RN: 1928-07-02 (87 y.o. [name] Primary Care Provider: Leonides Cave Other Clinician: Referring Provider: Treating Provider/Extender: Earnest Conroy Weeks in Treatment: 19 Active  Problems ICD-10 Encounter Code Description Active Date MDM Diagnosis E11.621 Type 2 diabetes mellitus with  foot ulcer 12/11/2020 No Yes L97.418 Non-pressure chronic ulcer of right heel and midfoot with other specified 12/11/2020 No Yes severity E11.51 Type 2 diabetes mellitus with diabetic peripheral angiopathy without gangrene 12/11/2020 No Yes E11.42 Type 2 diabetes mellitus with diabetic polyneuropathy 12/11/2020 No Yes Inactive Problems ICD-10 Code Description Active Date Inactive Date L03.115 Cellulitis of right lower limb 02/06/2021 02/06/2021 Resolved Problems Electronic Signature(s) Signed: 04/27/2021 5:27:23 PM By: Linton Ham MD Previous Signature: 04/27/2021 12:53:54 PM Version By: Lorrin Jackson Entered By: Linton Ham on 04/27/2021 13:16:02 -------------------------------------------------------------------------------- Progress Note Details Patient Name: Date of Service: [name] 04/27/2021 12:30 PM Medical Record Number: [id number] Patient Account Number: [id number] Date of Birth/Sex: Treating RN: Dec 24, 1927 (87 y.o. [name] Primary Care Provider: Leonides Cave Other Clinician: Referring Provider: Treating Provider/Extender: Earnest Conroy Weeks in Treatment: 19 Subjective History of Present Illness (HPI) ADMISSION 12/11/2020 This is a 87 year old man who apparently fractured his left hip in June and then had a subsequent fall and femur fracture requiring a separate surgery. He spent a long time at rehab at [place] skilled facility. Sometime in October his wife noted a blister on the left heel that is gradually morphed into a pressure ulcer. He saw his primary physician on 11/07/2020 and he was referred here. The patient had prior problems with PAD on the left. His last arterial studies in September 2020 showed an ABI on the right of 1.03 with a TBI of 0.73 and biphasic waveforms on the left a ABI of 0.84, TBI's were not  done because the toe was surgically absent. He had monophasic and biphasic waveforms. In 2017 he had an angiogram showing a mom diffusely diseased SFA and occluded left popliteal he underwent a left popliteal and left peroneal artery angioplasty. Noteworthy on the LEFT the peroneal was the only patent vessel below the knee. I do not think his right side was actually studied Past medical history; type 2 diabetes on insulin with peripheral neuropathy, hypertension, hyperlipidemia, hip fracture and subsequent femur fracture is noted, and emphysema, history of bladder cancer requiring in and out catheterizations, coronary artery disease, BPH. Updated ABI in our clinic was 0.93 on the right 2/14 Santyl to continue. Mechanical debridement 2/21; using Santyl. Still requiring mechanical debridement today which was disappointing. 3/15; 3-week follow-up. Using Santyl but still requiring mechanical debridements in the same spot in this wound. Most of the rest of the surface of this wound looks satisfactory however in the most proximal part filled with mostly necrotic tissue. 3/29; 2-week follow-up. They have been using Santyl the patient's wife is changing the dressing as well as home health. Culture I did showed both Enterococcus and Klebsiella he was treated with 7 days of Augmentin but I am going to extend this. X-ray was negative for osteomyelitis 4/7; weekly follow-up. I changed him to silver collagen last week no real improvement here. He has completed the Augmentin. He still has really 2 wounds part of it filled then but a deeper part that is of probing area right to the bone itself or at least close to it. A lot of necrotic debris continues in this area. We ran Dermagraft the last time he was here however it is being discontinued. Moreover in consideration of a skin substitute, we would need to have something that could fold and be molded into the wound itself 4/14; weekly follow-up. Silver collagen. I  am attempting to get epi fix to try and close down the superior  divot. CT scan of this area did not show underlying osteomyelitis. In fact they mention that the wound does not go to bone. Certainly felt like it did clinically however. Unfortunately to get epi fix we are going to need a hemoglobin A1c. His wife states they just had lab work done at the [place] and we will see if we can get a copy of the hemoglobin A1c. The patient says it was 8.1 4/28; patient presents for 2-week follow-up. He has been using silver collagen every other day to the right calcaneus. He has no complaints or issues today. He denies any fever/chills, purulent drainage, erythema or increased warmth to the foot 5/12; 2-week follow-up. She is using silver collagen. Epi fix has a $341 per application co-pay or thereabouts. Arrived in clinic with purulent drainage coming from the divot at 2:00 5/19; 1 week follow-up using silver collagen. Moderate drainage. Culture I did of this last week was negative. He is not on any antibiotics. 6/3; using silver collagen. Minimal drainage. He has home health they are coming once a week but most of the time his wife changes this. There were 2 parts to this wound a more superficial part however that is totally epithelialized now he has however a deeper tunnel roughly 0.9 cm deep today. This does not probe to bone 6/17; patient arrives in clinic today and the area had eschar on the surface which I removed there is no open area although there is some divots here that are fully epithelialized. The result is skin right against bone Objective Constitutional Patient is hypertensive.. Pulse regular and within target range for patient.Marland Kitchen Respirations regular, non-labored and within target range.. Temperature is normal and within the target range for the patient.Marland Kitchen Appears in no distress. Vitals Time Taken: 12:52 PM, Height: 74 in, Weight: 205 lbs, BMI: 26.3, Temperature: 97.8 F, Pulse: 80 bpm,  Respiratory Rate: 16 breaths/min, Blood Pressure: 176/80 mmHg. General Notes: Exam; right calcaneus at the tip the superficial part of this is totally epithelialized. He has a divot however under illumination this is epithelialized. There is no evidence of surrounding infection Integumentary (Hair, Skin) Wound #1 status is Healed - Epithelialized. Original cause of wound was Pressure Injury. The date acquired was: 08/11/2020. The wound has been in treatment 19 weeks. The wound is located on the Right Calcaneus. The wound measures 0cm length x 0cm width x 0cm depth; 0cm^2 area and 0cm^3 volume. 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 -Keep pressure off newly healed area on heel. -Apply padding/heel cup -Moisturize with lotion Off-Loading: Turn and reposition every 2 hours Home Health: [facility] for wound care. - [facility] 1. I took off some callus dry skin. Our intake nurse reported a spot of drainage although under illumination I can see nothing that was open here. 2. We talked about the rigor of offloading this area both in his shoes, leg rests on a recliner or the mattress at night 3. He can be discharged his wife will monitor this area carefully Electronic Signature(s) Signed: 04/27/2021 5:27:23 PM By: Linton Ham MD Entered By: Linton Ham on 04/27/2021 13:18:45 -------------------------------------------------------------------------------- SuperBill Details Patient Name: Date of Service: [name] 04/27/2021 Medical Record Number:  [id number] Patient Account Number: [id number] Date of Birth/Sex: Treating  RN: 08/08/1928 (87 y.o. [name] Primary Care Provider: Leonides Cave Other Clinician: Referring Provider: Treating Provider/Extender: Earnest Conroy Weeks in Treatment: 19 Diagnosis Coding ICD-10 Codes Code Description E11.621 Type 2 diabetes mellitus with foot ulcer L97.418 Non-pressure chronic ulcer of right heel and midfoot with other specified severity E11.51 Type 2 diabetes mellitus with diabetic peripheral angiopathy without gangrene E11.42 Type 2 diabetes mellitus with diabetic polyneuropathy Facility Procedures CPT4 Code: [id number] Description: [phone] - WOUND CARE VISIT-LEV 2 EST PT Modifier: Quantity: 1 Physician Procedures : CPT4 Code Description Modifier [id number] - WC PHYS LEVEL 2 - EST PT ICD-10 Diagnosis Description E11.621 Type 2 diabetes mellitus with foot ulcer L97.418 Non-pressure chronic ulcer of right heel and midfoot with other specified severity Quantity: 1 Electronic Signature(s) Signed: 04/27/2021 5:27:23 PM By: Linton Ham MD Entered By: Linton Ham on 04/27/2021 13:19:01

## 2021-04-27 NOTE — Progress Notes (Addendum)
BORDEN, THUNE ([id number]) Visit Report for 04/27/2021 Arrival Information Details Patient Name: Date of Service: ADARSH, MUNDORF [place] RIO N. 04/27/2021 12:30 PM Medical Record Number: [id number] Patient Account Number: [id number] Date of Birth/Sex: Treating RN: [birth year] (87 y.o. [name], Meta.Reding Primary Care Chauna Osoria: Leonides Cave Other Clinician: Referring Angenette Daily: Treating Esraa Seres/Extender: Earnest Conroy Weeks in Treatment: 16 Visit Information History Since Last Visit Added or deleted any medications: No Patient Arrived: Wheel Chair Any new allergies or adverse reactions: No Arrival Time: 12:52 Had a fall or experienced change in No Accompanied By: wife activities of daily living that may affect Transfer Assistance: None risk of falls: Patient Identification Verified: Yes Signs or symptoms of abuse/neglect since last visito No Secondary Verification Process Completed: Yes Hospitalized since last visit: No Patient Requires Transmission-Based Precautions: No Implantable device outside of the clinic excluding No Patient Has Alerts: No cellular tissue based products placed in the center since last visit: Has Dressing in Place as Prescribed: Yes Pain Present Now: No Electronic Signature(s) Signed: 04/27/2021 4:58:53 PM By: Deon Pilling Entered By: Deon Pilling on 04/27/2021 12:58:57 -------------------------------------------------------------------------------- Clinic Level of Care Assessment Details Patient Name: Date of Service: YOBANI, SCHERTZER [place] RIO N. 04/27/2021 12:30 PM Medical Record Number: [id number] Patient Account Number: [id number] Date of Birth/Sex: Treating RN: 10-04-1928 (87 y.o. [name] Primary Care Kebra Lowrimore: Leonides Cave Other Clinician: Referring Shenelle Klas: Treating Suman Trivedi/Extender: Earnest Conroy Weeks in Treatment: 19 Clinic Level of Care Assessment Items TOOL 4 Quantity Score X- 1 0 Use  when only an EandM is performed on FOLLOW-UP visit ASSESSMENTS - Nursing Assessment / Reassessment X- 1 10 Reassessment of Co-morbidities (includes updates in patient status) X- 1 5 Reassessment of Adherence to Treatment Plan ASSESSMENTS - Wound and Skin A ssessment / Reassessment X - Simple Wound Assessment / Reassessment - one wound 1 5 []  - 0 Complex Wound Assessment / Reassessment - multiple wounds []  - 0 Dermatologic / Skin Assessment (not related to wound area) ASSESSMENTS - Focused Assessment []  - 0 Circumferential Edema Measurements - multi extremities []  - 0 Nutritional Assessment / Counseling / Intervention []  - 0 Lower Extremity Assessment (monofilament, tuning fork, pulses) []  - 0 Peripheral Arterial Disease Assessment (using hand held doppler) ASSESSMENTS - Ostomy and/or Continence Assessment and Care []  - 0 Incontinence Assessment and Management []  - 0 Ostomy Care Assessment and Management (repouching, etc.) PROCESS - Coordination of Care []  - 0 Simple Patient / Family Education for ongoing care X- 1 20 Complex (extensive) Patient / Family Education for ongoing care []  - 0 Staff obtains Programmer, systems, Records, T Results / Process Orders est X- 1 10 Staff telephones HHA, Nursing Homes / Clarify orders / etc []  - 0 Routine Transfer to another Facility (non-emergent condition) []  - 0 Routine Hospital Admission (non-emergent condition) []  - 0 New Admissions / Biomedical engineer / Ordering NPWT Apligraf, etc. , []  - 0 Emergency Hospital Admission (emergent condition) []  - 0 Simple Discharge Coordination []  - 0 Complex (extensive) Discharge Coordination PROCESS - Special Needs []  - 0 Pediatric / Minor Patient Management []  - 0 Isolation Patient Management []  - 0 Hearing / Language / Visual special needs []  - 0 Assessment of Community assistance (transportation, D/C planning, etc.) []  - 0 Additional assistance / Altered mentation []  - 0 Support  Surface(s) Assessment (bed, cushion, seat, etc.) INTERVENTIONS - Wound Cleansing / Measurement []  - 0 Simple Wound Cleansing - one wound []  - 0 Complex Wound Cleansing -  multiple wounds []  - 0 Wound Imaging (photographs - any number of wounds) []  - 0 Wound Tracing (instead of photographs) []  - 0 Simple Wound Measurement - one wound []  - 0 Complex Wound Measurement - multiple wounds INTERVENTIONS - Wound Dressings X - Small Wound Dressing one or multiple wounds 1 10 []  - 0 Medium Wound Dressing one or multiple wounds []  - 0 Large Wound Dressing one or multiple wounds []  - 0 Application of Medications - topical []  - 0 Application of Medications - injection INTERVENTIONS - Miscellaneous []  - 0 External ear exam []  - 0 Specimen Collection (cultures, biopsies, blood, body fluids, etc.) []  - 0 Specimen(s) / Culture(s) sent or taken to Lab for analysis []  - 0 Patient Transfer (multiple staff / Civil Service fast streamer / Similar devices) []  - 0 Simple Staple / Suture removal (25 or less) []  - 0 Complex Staple / Suture removal (26 or more) []  - 0 Hypo / Hyperglycemic Management (close monitor of Blood Glucose) []  - 0 Ankle / Brachial Index (ABI) - do not check if billed separately X- 1 5 Vital Signs Has the patient been seen at the hospital within the last three years: Yes Total Score: 65 Level Of Care: New/Established - Level 2 Electronic Signature(s) Signed: 04/27/2021 5:38:00 PM By: Lorrin Jackson Entered By: Lorrin Jackson on 04/27/2021 13:13:04 -------------------------------------------------------------------------------- Encounter Discharge Information Details Patient Name: Date of Service: [name] 04/27/2021 12:30 PM Medical Record Number: [id number] Patient Account Number: [id number] Date of Birth/Sex: Treating RN: 08-24-1928 (87 y.o. [name] Primary Care Caidynce Muzyka: Leonides Cave Other Clinician: Referring Friend Dorfman: Treating Tierany Appleby/Extender:  Earnest Conroy Weeks in Treatment: 19 Encounter Discharge Information Items Discharge Condition: Stable Ambulatory Status: Wheelchair Discharge Destination: Home Transportation: Private Auto Accompanied By: wife Schedule Follow-up Appointment: No Clinical Summary of Care: Provided on 04/27/2021 Form Type Recipient Paper Patient Patient Electronic Signature(s) Signed: 04/27/2021 3:59:40 PM By: Lorrin Jackson Entered By: Lorrin Jackson on 04/27/2021 15:59:40 -------------------------------------------------------------------------------- Lower Extremity Assessment Details Patient Name: Date of Service: [name] 04/27/2021 12:30 PM Medical Record Number: [id number] Patient Account Number: [id number] Date of Birth/Sex: Treating RN: 07-Aug-1928 (87 y.o. [name], [name] Primary Care Roxanne Orner: Leonides Cave Other Clinician: Referring Callaway Hailes: Treating Hy Swiatek/Extender: Earnest Conroy Weeks in Treatment: 19 Edema Assessment Assessed: Shirlyn Goltz: No] Patrice Paradise: Yes] Edema: [Left: N] [Right: o] Calf Left: Right: Point of Measurement: 33 cm From Medial Instep 33 cm Ankle Left: Right: Point of Measurement: 12 cm From Medial Instep 22 cm Vascular Assessment Pulses: Dorsalis Pedis Palpable: [Right:Yes] Electronic Signature(s) Signed: 04/27/2021 4:58:53 PM By: Deon Pilling Entered By: Deon Pilling on 04/27/2021 12:59:54 -------------------------------------------------------------------------------- Multi Wound Chart Details Patient Name: Date of Service: [name] 04/27/2021 12:30 PM Medical Record Number: [id number] Patient Account Number: [id number] Date of Birth/Sex: Treating RN: 1927/12/28 (87 y.o. [name] Primary Care Gregorey Nabor: Leonides Cave Other Clinician: Referring Jerome Viglione: Treating Lus Kriegel/Extender: Earnest Conroy Weeks in Treatment: 19 Vital Signs Height(in):  74 Pulse(bpm): 61 Weight(lbs): 205 Blood Pressure(mmHg): 176/80 Body Mass Index(BMI): 26 Temperature(F): 97.8 Respiratory Rate(breaths/min): 16 Photos: [1:No Photos Right Calcaneus] [N/A:N/A N/A] Wound Location: [1:Pressure Injury] [N/A:N/A] Wounding Event: [1:Diabetic Wound/Ulcer of the Lower] [N/A:N/A] Primary Etiology: [1:Extremity Pressure Ulcer] [N/A:N/A] Secondary Etiology: [1:Chronic Obstructive Pulmonary] [N/A:N/A] Comorbid History: [1:Disease (COPD), Congestive Heart Failure, Coronary Artery Disease, Hypertension, Peripheral Arterial Disease, Type II Diabetes, Neuropathy 08/11/2020] [N/A:N/A] Date Acquired: [1:19] [N/A:N/A] Weeks of Treatment: [1:Healed -  Epithelialized] [N/A:N/A] Wound Status: [1:0x0x0] [N/A:N/A] Measurements L x W x D (cm) [1:0] [N/A:N/A] A (cm) : rea [1:0] [N/A:N/A] Volume (cm) : [1:100.00%] [N/A:N/A] % Reduction in Area: [1:100.00%] [N/A:N/A] % Reduction in Volume: [1:Grade 2] [N/A:N/A] Treatment Notes Electronic Signature(s) Signed: 04/27/2021 5:27:23 PM By: Linton Ham MD Signed: 04/27/2021 5:38:00 PM By: Lorrin Jackson Entered By: Linton Ham on 04/27/2021 13:16:08 -------------------------------------------------------------------------------- Multi-Disciplinary Care Plan Details Patient Name: Date of Service: [name] 04/27/2021 12:30 PM Medical Record Number: [id number] Patient Account Number: [id number] Date of Birth/Sex: Treating RN: [birth year] (87 y.o. [name] Primary Care Tyler Cubit: Leonides Cave Other Clinician: Referring Aquarius Tremper: Treating Colsen Modi/Extender: Earnest Conroy Weeks in Treatment: Bennett reviewed with physician Active Inactive Electronic Signature(s) Signed: 04/27/2021 5:38:00 PM By: Lorrin Jackson Previous Signature: 04/27/2021 12:55:14 PM Version By: Lorrin Jackson Entered By: Lorrin Jackson on 04/27/2021  13:09:15 -------------------------------------------------------------------------------- Pain Assessment Details Patient Name: Date of Service: [name] 04/27/2021 12:30 PM Medical Record Number: [id number] Patient Account Number: [id number] Date of Birth/Sex: Treating RN: 1928/07/17 (87 y.o. Hessie Diener Primary Care Kenyotta Dorfman: Leonides Cave Other Clinician: Referring Deryl Ports: Treating Joud Pettinato/Extender: Earnest Conroy Weeks in Treatment: 19 Active Problems Location of Pain Severity and Description of Pain Patient Has Paino No Site Locations Rate the pain. Current Pain Level: 0 Pain Management and Medication Current Pain Management: Medication: No Cold Application: No Rest: No Massage: No Activity: No T.E.N.S.: No Heat Application: No Leg drop or elevation: No Is the Current Pain Management Adequate: Adequate How does your wound impact your activities of daily livingo Sleep: No Bathing: No Appetite: No Relationship With Others: No Bladder Continence: No Emotions: No Bowel Continence: No Work: No Toileting: No Drive: No Dressing: No Hobbies: No Electronic Signature(s) Signed: 04/27/2021 4:58:53 PM By: Deon Pilling Entered By: Deon Pilling on 04/27/2021 12:59:09 -------------------------------------------------------------------------------- Patient/Caregiver Education Details Patient Name: Date of Service: Enid Baas 6/17/2022andnbsp12:30 PM Medical Record Number: [id number] Patient Account Number: [id number] Date of Birth/Gender: Treating RN: [birth year] (87 y.o. [name] Primary Care Physician: Leonides Cave Other Clinician: Referring Physician: Treating Physician/Extender: Veneta Penton in Treatment: 19 Education Assessment Education Provided To: Patient Education Topics Provided Elevated Blood Sugar/ Impact on Healing: Methods: Explain/Verbal,  Printed Responses: State content correctly Offloading: Methods: Explain/Verbal, Printed Responses: State content correctly Wound/Skin Impairment: Methods: Explain/Verbal, Printed Responses: State content correctly Electronic Signature(s) Signed: 04/27/2021 5:38:00 PM By: Lorrin Jackson Entered By: Lorrin Jackson on 04/27/2021 12:55:43 -------------------------------------------------------------------------------- Wound Assessment Details Patient Name: Date of Service: [name] 04/27/2021 12:30 PM Medical Record Number: [id number] Patient Account Number: [id number] Date of Birth/Sex: Treating RN: January 05, 1928 (87 y.o. [name] Primary Care Kaja Jackowski: Leonides Cave Other Clinician: Referring Vittoria Noreen: Treating Nanna Ertle/Extender: Earnest Conroy Weeks in Treatment: 19 Wound Status Wound Number: 1 Primary Diabetic Wound/Ulcer of the Lower Extremity Etiology: Wound Location: Right Calcaneus Secondary Pressure Ulcer Wounding Event: Pressure Injury Etiology: Date Acquired: 08/11/2020 Wound Healed - Epithelialized Weeks Of Treatment: 19 Status: Clustered Wound: No Comorbid Chronic Obstructive Pulmonary Disease (COPD), Congestive History: Heart Failure, Coronary Artery Disease, Hypertension, Peripheral Arterial Disease, Type II Diabetes, Neuropathy Photos Wound Measurements Length: (cm) Width: (cm) Depth: (cm) Area: (cm) Volume: (cm) 0 % Reduction in Area: 100% 0 % Reduction in Volume: 100% 0 0 0 Wound Description Classification: Grade 2 Electronic Signature(s) Signed: 04/30/2021 6:31:05 PM By: Lorrin Jackson Signed: 05/03/2021  8:38:46 AM By: Leane Call Previous Signature: 04/27/2021 5:38:00 PM Version By: Lorrin Jackson Entered By: Leane Call on 04/30/2021 14:30:43 -------------------------------------------------------------------------------- Vitals Details Patient Name: Date of Service: [name]  04/27/2021 12:30 PM Medical Record Number: [id number] Patient Account Number: [id number] Date of Birth/Sex: Treating RN: 27-Apr-1928 (87 y.o. [name], Meta.Reding Primary Care Hernandez Losasso: Leonides Cave Other Clinician: Referring Kyaire Gruenewald: Treating Lamontae Ricardo/Extender: Earnest Conroy Weeks in Treatment: 19 Vital Signs Time Taken: 12:52 Temperature (F): 97.8 Height (in): 74 Pulse (bpm): 80 Weight (lbs): 205 Respiratory Rate (breaths/min): 16 Body Mass Index (BMI): 26.3 Blood Pressure (mmHg): 176/80 Reference Range: 80 - 120 mg / dl Electronic Signature(s) Signed: 04/27/2021 4:58:53 PM By: Deon Pilling Entered By: Deon Pilling on 04/27/2021 13:00:27

## 2021-04-30 DIAGNOSIS — Z794 Long term (current) use of insulin: Secondary | ICD-10-CM | POA: Diagnosis not present

## 2021-04-30 DIAGNOSIS — E119 Type 2 diabetes mellitus without complications: Secondary | ICD-10-CM | POA: Diagnosis not present

## 2021-05-01 DIAGNOSIS — I509 Heart failure, unspecified: Secondary | ICD-10-CM | POA: Diagnosis not present

## 2021-05-01 DIAGNOSIS — Z9181 History of falling: Secondary | ICD-10-CM | POA: Diagnosis not present

## 2021-05-01 DIAGNOSIS — E785 Hyperlipidemia, unspecified: Secondary | ICD-10-CM | POA: Diagnosis not present

## 2021-05-01 DIAGNOSIS — E1151 Type 2 diabetes mellitus with diabetic peripheral angiopathy without gangrene: Secondary | ICD-10-CM | POA: Diagnosis not present

## 2021-05-01 DIAGNOSIS — Z8551 Personal history of malignant neoplasm of bladder: Secondary | ICD-10-CM | POA: Diagnosis not present

## 2021-05-01 DIAGNOSIS — J439 Emphysema, unspecified: Secondary | ICD-10-CM | POA: Diagnosis not present

## 2021-05-01 DIAGNOSIS — E1142 Type 2 diabetes mellitus with diabetic polyneuropathy: Secondary | ICD-10-CM | POA: Diagnosis not present

## 2021-05-01 DIAGNOSIS — I110 Hypertensive heart disease with heart failure: Secondary | ICD-10-CM | POA: Diagnosis not present

## 2021-05-01 DIAGNOSIS — L97412 Non-pressure chronic ulcer of right heel and midfoot with fat layer exposed: Secondary | ICD-10-CM | POA: Diagnosis not present

## 2021-05-01 DIAGNOSIS — E11621 Type 2 diabetes mellitus with foot ulcer: Secondary | ICD-10-CM | POA: Diagnosis not present

## 2021-05-04 ENCOUNTER — Encounter (INDEPENDENT_AMBULATORY_CARE_PROVIDER_SITE_OTHER): Payer: Medicare Other | Admitting: Family Medicine

## 2021-05-04 DIAGNOSIS — R300 Dysuria: Secondary | ICD-10-CM | POA: Diagnosis not present

## 2021-05-04 DIAGNOSIS — M79643 Pain in unspecified hand: Secondary | ICD-10-CM

## 2021-05-04 DIAGNOSIS — E1142 Type 2 diabetes mellitus with diabetic polyneuropathy: Secondary | ICD-10-CM

## 2021-05-04 DIAGNOSIS — Z794 Long term (current) use of insulin: Secondary | ICD-10-CM

## 2021-05-04 DIAGNOSIS — E11628 Type 2 diabetes mellitus with other skin complications: Secondary | ICD-10-CM

## 2021-05-10 DIAGNOSIS — R062 Wheezing: Secondary | ICD-10-CM | POA: Diagnosis not present

## 2021-05-10 DIAGNOSIS — J449 Chronic obstructive pulmonary disease, unspecified: Secondary | ICD-10-CM | POA: Diagnosis not present

## 2021-05-10 DIAGNOSIS — I509 Heart failure, unspecified: Secondary | ICD-10-CM | POA: Diagnosis not present

## 2021-05-10 DIAGNOSIS — J9600 Acute respiratory failure, unspecified whether with hypoxia or hypercapnia: Secondary | ICD-10-CM | POA: Diagnosis not present

## 2021-05-22 DIAGNOSIS — J9600 Acute respiratory failure, unspecified whether with hypoxia or hypercapnia: Secondary | ICD-10-CM | POA: Diagnosis not present

## 2021-05-22 DIAGNOSIS — J449 Chronic obstructive pulmonary disease, unspecified: Secondary | ICD-10-CM | POA: Diagnosis not present

## 2021-05-22 DIAGNOSIS — R062 Wheezing: Secondary | ICD-10-CM | POA: Diagnosis not present

## 2021-05-22 DIAGNOSIS — I509 Heart failure, unspecified: Secondary | ICD-10-CM | POA: Diagnosis not present

## 2021-05-24 DIAGNOSIS — R339 Retention of urine, unspecified: Secondary | ICD-10-CM | POA: Diagnosis not present

## 2021-05-28 ENCOUNTER — Other Ambulatory Visit: Payer: Self-pay | Admitting: Family Medicine

## 2021-05-28 DIAGNOSIS — N400 Enlarged prostate without lower urinary tract symptoms: Secondary | ICD-10-CM

## 2021-06-08 ENCOUNTER — Telehealth: Payer: Self-pay | Admitting: Family Medicine

## 2021-06-08 NOTE — Telephone Encounter (Signed)
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  He's in bed now so I'm letting him rest.  Just concerned.  I spoke with pts wife (DPR signed) pt started with symptoms 05/31/21; pt is not eating his usual amt. Pts wife usually does in and out catheter 4 - 5 times a day but this past week has to catheterize pt maybe 2 - 3 times a day.no sediment or blood in urine. Urine is darker than usual. Pt is not drinking as much as usual; pt drinks 2 - 3  of the 24 oz containers a day. Pt drinks more tea and soda than water. No complaints of burning or pain with urine, no abd or back pain. Pt complains that he hurts all over. No fever,chills or H/A. Pt feels warm in early mornings but has not taken temp. Pt is on 2L oxygen at night. Oxygen level now is 89 on room air. Pts wife got oxygen for pt to put on. No diarrhea or vomiting and no other covid symptoms. Pt has COPD and usually has dry cough, nothing new. No dizziness, and pt has dry mouth. Pts wife said that she is concerned he is not eating and drinking enough. I advised due to pts not eating and drinking as usual, not urinating as much,and urine is darker than usual along with dry mouth starting on 06/07/21 advised pt's wife pt should go to ED for eval and possible IV fluids; pt was advised and I could hear pt in background say loudly he is not going to ED. UC & ED precautions given and pt wife voiced understanding. Pts wife request cb after reviewed by Dr Damita Dunnings. Janett Billow CMA notified to let Dr Damita Dunnings be aware of this note. Sending note to DR Damita Dunnings and Janett Billow CMA.[facility]

## 2021-06-08 NOTE — Telephone Encounter (Signed)
If his O2 doesn't come up with routine O2 use, if more SOB, or if concern for dehydration continues, then ER is reasonable.    I would have low threshold for eval given his situation in general esp with lower O2 sat.  I would at minimum recheck O2 sat off extra O2 and check temp.  If either are abnormal, then ER is likely reasonable.  I am concerned that he is going to need ER eval.  Thanks.

## 2021-06-08 NOTE — Telephone Encounter (Signed)
See mychart message, please triage patient.  Thanks.

## 2021-06-08 NOTE — Telephone Encounter (Signed)
Noted. Thanks.

## 2021-06-08 NOTE — Telephone Encounter (Signed)
Spoke with [place]. Patients O2 has come up to 96% now on his O2. He has been drinking plenty of water and is feeling better. Alison Murray states he has been eating as well. Advised her if he O2 goes back down with oxygen use or develops any other sx then to please get eval at ER. She agreed to do so if she needs to.

## 2021-06-09 DIAGNOSIS — I509 Heart failure, unspecified: Secondary | ICD-10-CM | POA: Diagnosis not present

## 2021-06-09 DIAGNOSIS — J449 Chronic obstructive pulmonary disease, unspecified: Secondary | ICD-10-CM | POA: Diagnosis not present

## 2021-06-09 DIAGNOSIS — J9600 Acute respiratory failure, unspecified whether with hypoxia or hypercapnia: Secondary | ICD-10-CM | POA: Diagnosis not present

## 2021-06-09 DIAGNOSIS — R062 Wheezing: Secondary | ICD-10-CM | POA: Diagnosis not present

## 2021-06-10 NOTE — Addendum Note (Signed)
Addended by: Tonia Ghent on: 06/10/2021 09:29 PM   Modules accepted: Orders

## 2021-06-11 ENCOUNTER — Emergency Department (HOSPITAL_COMMUNITY): Payer: Medicare Other

## 2021-06-11 ENCOUNTER — Encounter (HOSPITAL_COMMUNITY): Payer: Self-pay

## 2021-06-11 ENCOUNTER — Inpatient Hospital Stay (HOSPITAL_COMMUNITY)
Admission: EM | Admit: 2021-06-11 | Discharge: 2021-06-16 | DRG: 177 | Disposition: A | Discharge status: Home Health | Payer: Medicare Other | Attending: Internal Medicine | Admitting: Internal Medicine

## 2021-06-11 DIAGNOSIS — E1151 Type 2 diabetes mellitus with diabetic peripheral angiopathy without gangrene: Secondary | ICD-10-CM | POA: Diagnosis present

## 2021-06-11 DIAGNOSIS — Z89432 Acquired absence of left foot: Secondary | ICD-10-CM

## 2021-06-11 DIAGNOSIS — L89156 Pressure-induced deep tissue damage of sacral region: Secondary | ICD-10-CM | POA: Diagnosis present

## 2021-06-11 DIAGNOSIS — I214 Non-ST elevation (NSTEMI) myocardial infarction: Secondary | ICD-10-CM | POA: Diagnosis not present

## 2021-06-11 DIAGNOSIS — L409 Psoriasis, unspecified: Secondary | ICD-10-CM | POA: Diagnosis present

## 2021-06-11 DIAGNOSIS — R531 Weakness: Secondary | ICD-10-CM | POA: Diagnosis not present

## 2021-06-11 DIAGNOSIS — Z882 Allergy status to sulfonamides status: Secondary | ICD-10-CM | POA: Diagnosis not present

## 2021-06-11 DIAGNOSIS — Z515 Encounter for palliative care: Secondary | ICD-10-CM

## 2021-06-11 DIAGNOSIS — I5033 Acute on chronic diastolic (congestive) heart failure: Secondary | ICD-10-CM | POA: Diagnosis present

## 2021-06-11 DIAGNOSIS — J9601 Acute respiratory failure with hypoxia: Secondary | ICD-10-CM | POA: Diagnosis not present

## 2021-06-11 DIAGNOSIS — J90 Pleural effusion, not elsewhere classified: Secondary | ICD-10-CM | POA: Diagnosis not present

## 2021-06-11 DIAGNOSIS — Z794 Long term (current) use of insulin: Secondary | ICD-10-CM

## 2021-06-11 DIAGNOSIS — J690 Pneumonitis due to inhalation of food and vomit: Principal | ICD-10-CM | POA: Diagnosis present

## 2021-06-11 DIAGNOSIS — Z20822 Contact with and (suspected) exposure to covid-19: Secondary | ICD-10-CM | POA: Diagnosis not present

## 2021-06-11 DIAGNOSIS — N400 Enlarged prostate without lower urinary tract symptoms: Secondary | ICD-10-CM | POA: Diagnosis present

## 2021-06-11 DIAGNOSIS — J80 Acute respiratory distress syndrome: Secondary | ICD-10-CM | POA: Diagnosis not present

## 2021-06-11 DIAGNOSIS — R61 Generalized hyperhidrosis: Secondary | ICD-10-CM | POA: Diagnosis not present

## 2021-06-11 DIAGNOSIS — Z66 Do not resuscitate: Secondary | ICD-10-CM | POA: Diagnosis present

## 2021-06-11 DIAGNOSIS — Z96642 Presence of left artificial hip joint: Secondary | ICD-10-CM | POA: Diagnosis present

## 2021-06-11 DIAGNOSIS — E785 Hyperlipidemia, unspecified: Secondary | ICD-10-CM | POA: Diagnosis present

## 2021-06-11 DIAGNOSIS — I248 Other forms of acute ischemic heart disease: Secondary | ICD-10-CM | POA: Diagnosis not present

## 2021-06-11 DIAGNOSIS — Z888 Allergy status to other drugs, medicaments and biological substances status: Secondary | ICD-10-CM | POA: Diagnosis not present

## 2021-06-11 DIAGNOSIS — Z7189 Other specified counseling: Secondary | ICD-10-CM

## 2021-06-11 DIAGNOSIS — I2589 Other forms of chronic ischemic heart disease: Secondary | ICD-10-CM | POA: Diagnosis not present

## 2021-06-11 DIAGNOSIS — N1832 Chronic kidney disease, stage 3b: Secondary | ICD-10-CM | POA: Diagnosis not present

## 2021-06-11 DIAGNOSIS — I2489 Other forms of acute ischemic heart disease: Secondary | ICD-10-CM

## 2021-06-11 DIAGNOSIS — R0902 Hypoxemia: Secondary | ICD-10-CM | POA: Diagnosis not present

## 2021-06-11 DIAGNOSIS — N183 Chronic kidney disease, stage 3 unspecified: Secondary | ICD-10-CM

## 2021-06-11 DIAGNOSIS — I130 Hypertensive heart and chronic kidney disease with heart failure and stage 1 through stage 4 chronic kidney disease, or unspecified chronic kidney disease: Secondary | ICD-10-CM | POA: Diagnosis present

## 2021-06-11 DIAGNOSIS — Z9981 Dependence on supplemental oxygen: Secondary | ICD-10-CM

## 2021-06-11 DIAGNOSIS — Z8551 Personal history of malignant neoplasm of bladder: Secondary | ICD-10-CM

## 2021-06-11 DIAGNOSIS — N3020 Other chronic cystitis without hematuria: Secondary | ICD-10-CM | POA: Diagnosis not present

## 2021-06-11 DIAGNOSIS — Z743 Need for continuous supervision: Secondary | ICD-10-CM | POA: Diagnosis not present

## 2021-06-11 DIAGNOSIS — J9602 Acute respiratory failure with hypercapnia: Secondary | ICD-10-CM

## 2021-06-11 DIAGNOSIS — L408 Other psoriasis: Secondary | ICD-10-CM | POA: Diagnosis present

## 2021-06-11 DIAGNOSIS — E1142 Type 2 diabetes mellitus with diabetic polyneuropathy: Secondary | ICD-10-CM | POA: Diagnosis present

## 2021-06-11 DIAGNOSIS — H9190 Unspecified hearing loss, unspecified ear: Secondary | ICD-10-CM | POA: Diagnosis present

## 2021-06-11 DIAGNOSIS — I2510 Atherosclerotic heart disease of native coronary artery without angina pectoris: Secondary | ICD-10-CM | POA: Diagnosis not present

## 2021-06-11 DIAGNOSIS — J9811 Atelectasis: Secondary | ICD-10-CM | POA: Diagnosis not present

## 2021-06-11 DIAGNOSIS — J432 Centrilobular emphysema: Secondary | ICD-10-CM | POA: Diagnosis not present

## 2021-06-11 DIAGNOSIS — Z833 Family history of diabetes mellitus: Secondary | ICD-10-CM

## 2021-06-11 DIAGNOSIS — R5381 Other malaise: Secondary | ICD-10-CM | POA: Diagnosis not present

## 2021-06-11 DIAGNOSIS — G8929 Other chronic pain: Secondary | ICD-10-CM | POA: Diagnosis present

## 2021-06-11 DIAGNOSIS — D7589 Other specified diseases of blood and blood-forming organs: Secondary | ICD-10-CM | POA: Diagnosis not present

## 2021-06-11 DIAGNOSIS — R0602 Shortness of breath: Secondary | ICD-10-CM | POA: Diagnosis not present

## 2021-06-11 DIAGNOSIS — J811 Chronic pulmonary edema: Secondary | ICD-10-CM | POA: Diagnosis not present

## 2021-06-11 DIAGNOSIS — Z87891 Personal history of nicotine dependence: Secondary | ICD-10-CM

## 2021-06-11 DIAGNOSIS — Z79899 Other long term (current) drug therapy: Secondary | ICD-10-CM

## 2021-06-11 DIAGNOSIS — Z8249 Family history of ischemic heart disease and other diseases of the circulatory system: Secondary | ICD-10-CM

## 2021-06-11 DIAGNOSIS — E119 Type 2 diabetes mellitus without complications: Secondary | ICD-10-CM

## 2021-06-11 LAB — COMPREHENSIVE METABOLIC PANEL
ALT: 9 U/L (ref 0–44)
AST: 12 U/L — ABNORMAL LOW (ref 15–41)
Albumin: 3.6 g/dL (ref 3.5–5.0)
Alkaline Phosphatase: 52 U/L (ref 38–126)
Anion gap: 7 (ref 5–15)
BUN: 41 mg/dL — ABNORMAL HIGH (ref 8–23)
CO2: 37 mmol/L — ABNORMAL HIGH (ref 22–32)
Calcium: 9.1 mg/dL (ref 8.9–10.3)
Chloride: 98 mmol/L (ref 98–111)
Creatinine, Ser: 1.74 mg/dL — ABNORMAL HIGH (ref 0.61–1.24)
GFR, Estimated: 37 mL/min — ABNORMAL LOW (ref >60)
Glucose, Bld: 161 mg/dL — ABNORMAL HIGH (ref 70–99)
Potassium: 4.9 mmol/L (ref 3.5–5.1)
Sodium: 142 mmol/L (ref 135–145)
Total Bilirubin: 0.7 mg/dL (ref 0.3–1.2)
Total Protein: 6.7 g/dL (ref 6.5–8.1)

## 2021-06-11 LAB — RESP PANEL BY RT-PCR (FLU A&B, COVID) ARPGX2
Influenza A by PCR: NEGATIVE
Influenza B by PCR: NEGATIVE
SARS Coronavirus 2 by RT PCR: NEGATIVE

## 2021-06-11 LAB — BLOOD GAS, ARTERIAL
Acid-Base Excess: 7.4 mmol/L — ABNORMAL HIGH (ref 0.0–2.0)
Bicarbonate: 36.9 mmol/L — ABNORMAL HIGH (ref 20.0–28.0)
O2 Saturation: 98 %
Patient temperature: 98.6
pCO2 arterial: 78 mmHg (ref 32.0–48.0)
pH, Arterial: 7.297 — ABNORMAL LOW (ref 7.350–7.450)
pO2, Arterial: 113 mmHg — ABNORMAL HIGH (ref 83.0–108.0)

## 2021-06-11 LAB — ECHOCARDIOGRAM COMPLETE
Area-P 1/2: 3.77 cm2
Calc EF: 59.9 %
Height: 74 in
S' Lateral: 3.4 cm
Single Plane A2C EF: 62.9 %
Single Plane A4C EF: 55.2 %
Weight: 3360 oz

## 2021-06-11 LAB — CBC
HCT: 49.5 % (ref 39.0–52.0)
Hemoglobin: 15.1 g/dL (ref 13.0–17.0)
MCH: 33 pg (ref 26.0–34.0)
MCHC: 30.5 g/dL (ref 30.0–36.0)
MCV: 108.3 fL — ABNORMAL HIGH (ref 80.0–100.0)
Platelets: 158 10*3/uL (ref 150–400)
RBC: 4.57 MIL/uL (ref 4.22–5.81)
RDW: 13.4 % (ref 11.5–15.5)
WBC: 7.2 10*3/uL (ref 4.0–10.5)
nRBC: 0 % (ref 0.0–0.2)

## 2021-06-11 LAB — D-DIMER, QUANTITATIVE: D-Dimer, Quant: 1.26 ug/mL-FEU — ABNORMAL HIGH (ref 0.00–0.50)

## 2021-06-11 LAB — TROPONIN I (HIGH SENSITIVITY)
Troponin I (High Sensitivity): 315 ng/L (ref <18)
Troponin I (High Sensitivity): 283 ng/L (ref <18)
Troponin I (High Sensitivity): 243 ng/L (ref <18)
Troponin I (High Sensitivity): 184 ng/L (ref <18)

## 2021-06-11 LAB — BRAIN NATRIURETIC PEPTIDE: B Natriuretic Peptide: 304.5 pg/mL — ABNORMAL HIGH (ref 0.0–100.0)

## 2021-06-11 LAB — LACTIC ACID, PLASMA: Lactic Acid, Venous: 0.8 mmol/L (ref 0.5–1.9)

## 2021-06-11 LAB — GLUCOSE, CAPILLARY: Glucose-Capillary: 75 mg/dL (ref 70–99)

## 2021-06-11 LAB — HEPARIN LEVEL (UNFRACTIONATED): Heparin Unfractionated: 0.16 IU/mL — ABNORMAL LOW (ref 0.30–0.70)

## 2021-06-11 MED ORDER — CHLORHEXIDINE GLUCONATE CLOTH 2 % EX PADS: 6.0000 | MEDICATED_PAD | Freq: Every day | CUTANEOUS | Status: DC

## 2021-06-11 MED ORDER — HEPARIN (PORCINE) 25000 UT/250ML-% IV SOLN
1150.0000 [IU]/h | INTRAVENOUS | Status: DC
  Administered 2021-06-11: 1150 [IU]/h via INTRAVENOUS
  Filled 2021-06-11: qty 250

## 2021-06-11 MED ORDER — IOHEXOL 350 MG/ML SOLN
70.0000 mL | Freq: Once | INTRAVENOUS | Status: AC | PRN
  Administered 2021-06-11: 70 mL via INTRAVENOUS

## 2021-06-11 MED ORDER — ACETAMINOPHEN 325 MG PO TABS
650.0000 mg | ORAL_TABLET | Freq: Four times a day (QID) | ORAL | Status: DC | PRN
  Administered 2021-06-13 – 2021-06-15 (×3): 650 mg via ORAL
  Filled 2021-06-11 (×3): qty 2

## 2021-06-11 MED ORDER — CHLORHEXIDINE GLUCONATE CLOTH 2 % EX PADS
6.0000 | MEDICATED_PAD | Freq: Every day | CUTANEOUS | Status: DC
  Administered 2021-06-12 – 2021-06-16 (×5): 6 via TOPICAL

## 2021-06-11 MED ORDER — TRAMADOL HCL 50 MG PO TABS: 50.0000 mg | ORAL_TABLET | Freq: Three times a day (TID) | ORAL | Status: DC | PRN

## 2021-06-11 MED ORDER — INSULIN ASPART 100 UNIT/ML IJ SOLN
0.0000 [IU] | Freq: Three times a day (TID) | INTRAMUSCULAR | Status: DC
  Administered 2021-06-12 (×3): 2 [IU] via SUBCUTANEOUS
  Administered 2021-06-13: 1 [IU] via SUBCUTANEOUS
  Administered 2021-06-13: 3 [IU] via SUBCUTANEOUS
  Administered 2021-06-14: 5 [IU] via SUBCUTANEOUS
  Administered 2021-06-14: 1 [IU] via SUBCUTANEOUS
  Administered 2021-06-14: 7 [IU] via SUBCUTANEOUS
  Administered 2021-06-15: 5 [IU] via SUBCUTANEOUS
  Administered 2021-06-15: 3 [IU] via SUBCUTANEOUS
  Administered 2021-06-15: 1 [IU] via SUBCUTANEOUS
  Administered 2021-06-16: 3 [IU] via SUBCUTANEOUS
  Administered 2021-06-16: 7 [IU] via SUBCUTANEOUS
  Filled 2021-06-11: qty 0.09

## 2021-06-11 MED ORDER — TRAZODONE HCL 50 MG PO TABS
25.0000 mg | ORAL_TABLET | Freq: Every day | ORAL | Status: AC
  Administered 2021-06-11: 25 mg via ORAL
  Filled 2021-06-11: qty 1

## 2021-06-11 MED ORDER — DEXTROSE-NACL 5-0.9 % IV SOLN: INTRAVENOUS | Status: DC

## 2021-06-11 MED ORDER — HEPARIN BOLUS VIA INFUSION
3000.0000 [IU] | INTRAVENOUS | Status: AC
  Administered 2021-06-11: 3000 [IU] via INTRAVENOUS
  Filled 2021-06-11: qty 3000

## 2021-06-11 MED ORDER — PANTOPRAZOLE SODIUM 40 MG PO TBEC
80.0000 mg | DELAYED_RELEASE_TABLET | Freq: Every day | ORAL | Status: DC
Start: 2021-06-11 — End: 2021-06-16
  Administered 2021-06-11 – 2021-06-16 (×6): 80 mg via ORAL
  Filled 2021-06-11 (×7): qty 2

## 2021-06-11 MED ORDER — FUROSEMIDE 10 MG/ML IJ SOLN
80.0000 mg | Freq: Once | INTRAMUSCULAR | Status: AC
  Administered 2021-06-11: 80 mg via INTRAVENOUS
  Filled 2021-06-11: qty 8

## 2021-06-11 MED ORDER — HEPARIN BOLUS VIA INFUSION
4000.0000 [IU] | Freq: Once | INTRAVENOUS | Status: AC
  Administered 2021-06-11: 4000 [IU] via INTRAVENOUS
  Filled 2021-06-11: qty 4000

## 2021-06-11 MED ORDER — ORAL CARE MOUTH RINSE
15.0000 mL | Freq: Two times a day (BID) | OROMUCOSAL | Status: DC
  Administered 2021-06-12 – 2021-06-15 (×5): 15 mL via OROMUCOSAL

## 2021-06-11 MED ORDER — INSULIN ASPART 100 UNIT/ML IJ SOLN
0.0000 [IU] | Freq: Every day | INTRAMUSCULAR | Status: DC
  Administered 2021-06-13: 2 [IU] via SUBCUTANEOUS
  Administered 2021-06-15: 4 [IU] via SUBCUTANEOUS
  Filled 2021-06-11: qty 0.05

## 2021-06-11 MED ORDER — DOXYCYCLINE HYCLATE 100 MG PO TABS
50.0000 mg | ORAL_TABLET | Freq: Every day | ORAL | Status: DC
  Administered 2021-06-11 – 2021-06-16 (×6): 50 mg via ORAL
  Filled 2021-06-11 (×6): qty 1

## 2021-06-11 MED ORDER — TIOTROPIUM BROMIDE MONOHYDRATE 18 MCG IN CAPS: 18.0000 ug | ORAL_CAPSULE | Freq: Every day | RESPIRATORY_TRACT | Status: DC

## 2021-06-11 MED ORDER — FINASTERIDE 5 MG PO TABS
5.0000 mg | ORAL_TABLET | Freq: Every day | ORAL | Status: DC
  Administered 2021-06-11 – 2021-06-16 (×6): 5 mg via ORAL
  Filled 2021-06-11 (×6): qty 1

## 2021-06-11 MED ORDER — CHLORHEXIDINE GLUCONATE 0.12 % MT SOLN
15.0000 mL | Freq: Two times a day (BID) | OROMUCOSAL | Status: DC
  Administered 2021-06-11 – 2021-06-16 (×9): 15 mL via OROMUCOSAL
  Filled 2021-06-11 (×9): qty 15

## 2021-06-11 MED ORDER — ACETAMINOPHEN 650 MG RE SUPP: 650.0000 mg | Freq: Four times a day (QID) | RECTAL | Status: DC | PRN

## 2021-06-11 MED ORDER — TAMSULOSIN HCL 0.4 MG PO CAPS
0.8000 mg | ORAL_CAPSULE | Freq: Every day | ORAL | Status: DC
  Administered 2021-06-11 – 2021-06-16 (×6): 0.8 mg via ORAL
  Filled 2021-06-11 (×6): qty 2

## 2021-06-11 MED ORDER — HEPARIN (PORCINE) 25000 UT/250ML-% IV SOLN
1450.0000 [IU]/h | INTRAVENOUS | Status: DC
  Administered 2021-06-12: 1450 [IU]/h via INTRAVENOUS
  Filled 2021-06-11: qty 250

## 2021-06-11 MED ORDER — HEPARIN (PORCINE) 25000 UT/250ML-% IV SOLN: 12.0000 [IU]/kg/h | INTRAVENOUS | Status: DC

## 2021-06-11 MED ORDER — ONDANSETRON HCL 4 MG/2ML IJ SOLN: 4.0000 mg | Freq: Four times a day (QID) | INTRAMUSCULAR | Status: DC | PRN

## 2021-06-11 MED ORDER — ONDANSETRON HCL 4 MG PO TABS: 4.0000 mg | ORAL_TABLET | Freq: Four times a day (QID) | ORAL | Status: DC | PRN

## 2021-06-11 MED ORDER — DOCUSATE SODIUM 100 MG PO CAPS
100.0000 mg | ORAL_CAPSULE | Freq: Two times a day (BID) | ORAL | Status: DC
  Administered 2021-06-11 – 2021-06-16 (×10): 100 mg via ORAL
  Filled 2021-06-11 (×10): qty 1

## 2021-06-11 MED ORDER — UMECLIDINIUM BROMIDE 62.5 MCG/INH IN AEPB
1.0000 | INHALATION_SPRAY | Freq: Every day | RESPIRATORY_TRACT | Status: DC
  Administered 2021-06-12 – 2021-06-15 (×4): 1 via RESPIRATORY_TRACT
  Filled 2021-06-11: qty 7

## 2021-06-11 NOTE — ED Provider Notes (Addendum)
[facility] DEPT Provider Note   CSN: [id number] Arrival date & time: 06/11/21  0716     History Chief Complaint  Patient presents with   Weakness    [name] Small is a 87 y.o. male.  [name] Small presents via EMS for generalized weakness and low oxygen saturation.  He typically wears oxygen by nasal cannula at night.  However, today his O2 sats were in the 80s.  He is also usually able to stand and pivot but cannot do so.  He states that he is having no pain but he does have a cough.  The history is provided by the patient.  Shortness of Breath Severity:  Moderate Onset quality:  Sudden Duration:  1 day Timing:  Constant Progression:  Worsening Chronicity:  New Context comment:  Unknown cause Relieved by:  Oxygen Worsened by:  Activity Ineffective treatments:  Oxygen Associated symptoms: cough   Associated symptoms: no abdominal pain, no chest pain, no claudication, no ear pain, no fever, no headaches, no rash, no sore throat and no vomiting       Past Medical History:  Diagnosis Date   (HFpEF) heart failure with preserved ejection fraction (HCC)    Arthritis    Back pain    s/p lumbar injection 2014   BENIGN PROSTATIC HYPERTROPHY 05/21/2007   Bladder cancer ([place]) 10/2011   CAD (coronary artery disease)    nonobstructive by cath 6/12:  mid LAD 30%, proximal obtuse marginal-2 30%, proximal RCA 20%, mid RCA 30-40%.  He had normal cardiac output and mildly elevated filling pressures but no significant pulmonary hypertension;   Echocardiogram in May 2012 demonstrated EF 50-55% and left atrial enlargement    Cellulitis of left foot    Hospitalized in 2006   Chronic kidney disease (CKD), stage III (moderate) (HCC) 12/04/2007   FOLLOWED BY DR PATEL   Chronic pain of lower extremity    Complication of anesthesia    1 time bladder cancer surgery- 2012 , oxygen satruratioon dropped had to stay overnight, no problems since then.   COPD  (chronic obstructive pulmonary disease) ([place])    DIABETES MELLITUS, TYPE II 05/21/2007   DIVERTICULOSIS, COLON 05/21/2007   pt denies   Dyspnea    with exertion, patient mointors saturation   Fatigue AGE-RELATED   Gross hematuria    pt denies   HYPERTENSION 05/21/2007   Impaired hearing BILATERAL HEARING AIDS   On home oxygen therapy    as needed   PAD (peripheral artery disease) ([place])    a. 02/2016 L foot nonhealing ulcer-->Periph angio: L pop 100 w/ reconstitution via extensive vollats to the prox-mid peroneal (only patent vessel BK)-->PTA of L Pop and peroneal; b. Ongoing LE ischemia 5 & 06/2016 req amputation of toes on L foot.   Pneumonia    Hx  of    Psoriasis ELBOWS   Psoriasis    PSORIASIS, SCALP 10/25/2008    Patient Active Problem List   Diagnosis Date Noted   Ulcer of foot ([place]) 11/07/2020   Recurrent UTI 07/31/2020   Insomnia 05/25/2020   Advance care planning    Gastroesophageal reflux disease with esophagitis without hemorrhage    Ulcer of esophagus without bleeding    Urinary retention with incomplete bladder emptying 05/10/2020   Adjustment reaction of late life    Acute kidney injury ([place])    Gross hematuria    Controlled type 2 diabetes mellitus with hyperglycemia, with long-term current use of insulin ([place])  Benign essential HTN    Anxiety 12/15/2019   Anemia 01/31/2017   Hypoxia    Status post lumbar laminectomy 12/26/2016    Class: Chronic   History of femur fracture 12/26/2016   Idiopathic chronic venous hypertension of left lower extremity with inflammation 11/07/2016   Spinal stenosis of lumbar region with neurogenic claudication 10/18/2016   COPD (chronic obstructive pulmonary disease) ([place]) 06/10/2016   Weakness generalized 03/18/2016   Critical lower limb ischemia ([place]) 02/28/2016   Peripheral arterial disease ([place]) 10/17/2015   Edema 07/19/2015   Claudication ([place]) 01/31/2015   DOE (dyspnea on exertion) 01/31/2015   HTN (hypertension)  01/31/2015   Chronic diastolic CHF (congestive heart failure) ([place]) 01/21/2014   Atrial fibrillation ([place]) 12/16/2012   Bladder cancer ([place]) 09/22/2011   Leg cramps 08/28/2011   Cough 05/26/2011   CAD (coronary artery disease) of artery bypass graft 05/09/2011   Dyspnea on exertion 04/19/2011   PSORIASIS, SCALP 10/25/2008   Chronic kidney disease, stage III (moderate) ([place]) 12/04/2007   Type 2 diabetes mellitus with diabetic polyneuropathy ([place]) 05/21/2007   DIVERTICULOSIS, COLON 05/21/2007   BPH (benign prostatic hyperplasia) 05/21/2007    Past Surgical History:  Procedure Laterality Date   AMPUTATION Left 03/27/2016   Procedure: partial first AMPUTATION RAY; LEFT;  Surgeon: Trula Slade, DPM;  Location: [facility];  Service: Podiatry;  Laterality: Left;   AMPUTATION Left 06/12/2016   Procedure: TRANSMETATARSAL AMPUTATION LEFT FOOT;  Surgeon: Newt Minion, MD;  Location: [facility];  Service: Orthopedics;  Laterality: Left;   APPENDECTOMY  1941   BIOPSY  05/16/2020   Procedure: BIOPSY;  Surgeon: Lavena Bullion, DO;  Location: [facility];  Service: Gastroenterology;;   CARDIAC CATHETERIZATION  04-24-11/  DR Rogue Jury ARIDA   MILD NONOBSTRUCTIVE CAD, NORMA CARDIAC OUTPUT   CARDIOVASCULAR STRESS TEST  2007   CATARACT EXTRACTION W/ INTRAOCULAR LENS  IMPLANT, BILATERAL Bilateral ~ 2010   CYSTOSCOPY  12/25/2011   Procedure: CYSTOSCOPY;  Surgeon: Molli Hazard, MD;  Location: [facility];  Service: Urology;  Laterality: N/A;  needs intubation and to be paralyzed    CYSTOSCOPY W/ RETROGRADES  10/30/2011   Procedure: CYSTOSCOPY WITH RETROGRADE PYELOGRAM;  Surgeon: Molli Hazard, MD;  [facility];  Service: Urology;  Laterality: Bilateral;  CYSTOSCOPY POSS TURBT BILATERAL RETROGRADE PYLEOGRAM    CYSTOSCOPY W/ RETROGRADES  11/30/2012   Procedure: CYSTOSCOPY WITH RETROGRADE PYELOGRAM;  Surgeon: Molli Hazard, MD;  [facility];  Service: Urology;  Laterality: Bilateral;  Flexible cystoscopy.   CYSTOSCOPY WITH BIOPSY  11/30/2012   Procedure: CYSTOSCOPY WITH BIOPSY;  Surgeon: Molli Hazard, MD;  Location: [facility];  Service: Urology;  Laterality: N/A;   ESOPHAGOGASTRODUODENOSCOPY (EGD) WITH PROPOFOL N/A 05/16/2020   Procedure: ESOPHAGOGASTRODUODENOSCOPY (EGD) WITH PROPOFOL;  Surgeon: Lavena Bullion, DO;  [facility];  Service: Gastroenterology;  Laterality: N/A;   FEMUR IM NAIL Left 08/01/2020   Procedure: INTRAMEDULLARY (IM) RETROGRADE FEMORAL NAILING;  Surgeon: Altamese Pierz, MD;  Location: [facility];  Service: Orthopedics;  Laterality: Left;   INCISION AND DRAINAGE FOOT Left ~ 2005   LEFT FOOT DUE TO INFECTION FROM  NAIL PUNCTURE INJURY   LUMBAR LAMINECTOMY/DECOMPRESSION MICRODISCECTOMY N/A 10/18/2016   Procedure: LEFT AND CENTRAL L4-5 LUMBAR LAMINECTOMY WITH RESECTION OF SYNOVIAL CYST;  Surgeon: Jessy Oto, MD;  Location: [facility];  Service: Orthopedics;  Laterality: N/A;   ORIF FEMUR FRACTURE Left 04/19/2020   Procedure: OPEN  REDUCTION INTERNAL FIXATION FEMORAL SHAFT FRACTURE;  Surgeon: Shona Needles, MD;  Location: [facility];  Service: Orthopedics;  Laterality: Left;   PENILE PROSTHESIS IMPLANT  1990s   PERIPHERAL VASCULAR CATHETERIZATION N/A 02/14/2016   Procedure: Abdominal Aortogram w/Lower Extremity;  Surgeon: Wellington Hampshire, MD;  Location: [facility];  Service: Cardiovascular;  Laterality: N/A;   PERIPHERAL VASCULAR CATHETERIZATION Left 02/28/2016   Procedure: Peripheral Vascular Balloon Angioplasty;  Surgeon: Wellington Hampshire, MD;  Location: [facility];  Service: Cardiovascular;  Laterality: Left;  left peroneal and popliteal artery   TONSILLECTOMY AND ADENOIDECTOMY  1962   TOTAL HIP ARTHROPLASTY Left 12/27/2016   Procedure: LEFT TOTAL HIP ARTHROPLASTY ANTERIOR APPROACH;  Surgeon: Mcarthur Rossetti, MD;  Location[facility];  Service: Orthopedics;   Laterality: Left;   TRANSURETHRAL RESECTION OF BLADDER TUMOR  10/30/2011   Procedure: TRANSURETHRAL RESECTION OF BLADDER TUMOR (TURBT);  Surgeon: Molli Hazard, MD;  Location: [facility];  Service: Urology;  Laterality: N/A;   TRANSURETHRAL RESECTION OF BLADDER TUMOR  12/25/2011   Procedure: TRANSURETHRAL RESECTION OF BLADDER TUMOR (TURBT);  Surgeon: Molli Hazard, MD;  Location: [facility];  Service: Urology;  Laterality: N/A;  need long gyrus instruments    VASECTOMY  1990s       Family History  Problem Relation Age of Onset   Diabetes Mother    Heart disease Brother    Heart disease Brother    Heart disease Brother    Heart disease Brother    Heart disease Brother    Heart disease Brother    Lung cancer Brother     Social History   Tobacco Use   Smoking status: Former    Packs/day: 2.00    Years: 22.00    Pack years: 44.00    Types: Cigarettes    Quit date: 11/11/1968    Years since quitting: 52.6   Smokeless tobacco: Never  Vaping Use   Vaping Use: Never used  Substance Use Topics   Alcohol use: Yes    Comment: occassional- 10/17/16- none  in 4 months- "too sick"   Drug use: No    Home Medications Prior to Admission medications   Medication Sig Start Date End Date Taking? Authorizing Provider  acetaminophen (TYLENOL) 325 MG tablet Take 1-2 tablets (325-650 mg total) by mouth every 4 (four) hours as needed for mild pain. 05/11/20   Angiulli, Lavon Paganini, PA-C  Apremilast 30 MG TABS Take 1 tablet by mouth daily. 03/30/19   Tonia Ghent, MD  atorvastatin (LIPITOR) 10 MG tablet Take 1 tablet (10 mg total) by mouth daily. 05/11/20   Angiulli, Lavon Paganini, PA-C  calcium carbonate (OS-CAL - DOSED IN MG OF ELEMENTAL CALCIUM) 1250 (500 Ca) MG tablet Take 1 tablet (500 mg of elemental calcium total) by mouth daily with breakfast. Patient not taking: Reported on 04/16/2021 05/11/20   Angiulli, Lavon Paganini, PA-C  Cholecalciferol (VITAMIN D) 125  MCG (5000 UT) CAPS Take 1 capsule by mouth daily. 08/08/20   Ainsley Spinner, PA-C  clobetasol (TEMOVATE) 0.05 % external solution Apply 1 application topically daily as needed (rash).     [provider]  CRANBERRY PO Take 4 capsules by mouth daily. Takes AZO 2 tabs BID    [provider]  docusate sodium (COLACE) 100 MG capsule Take 100 mg by mouth daily. Patient not taking: Reported on 04/16/2021    [provider]  doxycycline (ADOXA) 50 MG tablet  Take 50 mg by mouth daily.    [provider]  escitalopram (LEXAPRO) 5 MG tablet Take 1 tablet (5 mg total) by mouth daily. Patient not taking: Reported on 04/16/2021 05/11/20   Cathlyn Parsons, PA-C  finasteride (PROSCAR) 5 MG tablet Take 5 mg by mouth daily.    [provider]  fluticasone-salmeterol (ADVAIR) 250-50 MCG/ACT AEPB Take by mouth 2 (two) times daily as needed. 01/16/21   [provider]  insulin glargine (LANTUS) 100 UNIT/ML Solostar Pen Inject 15 Units into the skin daily. 03/23/21   Tonia Ghent, MD  loperamide (IMODIUM A-D) 2 MG tablet Take 1 tablet (2 mg total) by mouth 4 (four) times daily as needed for diarrhea or loose stools. Patient not taking: Reported on 04/16/2021 11/15/20   Tonia Ghent, MD  Multiple Vitamin (MULTIVITAMIN WITH MINERALS) TABS tablet Take 1 tablet by mouth daily. 05/11/20   Angiulli, Lavon Paganini, PA-C  omeprazole (PRILOSEC) 40 MG capsule TAKE 1 CAPSULE (40 MG TOTAL) BY MOUTH DAILY. 12/27/20   Tonia Ghent, MD  tamsulosin (FLOMAX) 0.4 MG CAPS capsule TAKE 2 CAPSULES BY MOUTH DAILY AFTER SUPPER. 05/30/21   Tonia Ghent, MD  tiotropium (SPIRIVA) 18 MCG inhalation capsule Place 18 mcg into inhaler and inhale daily.    [provider]  traMADol (ULTRAM) 50 MG tablet Take 1 tablet (50 mg total) by mouth every 8 (eight) hours as needed for moderate pain or severe pain. 08/08/20   Ainsley Spinner, PA-C  traZODone (DESYREL) 50 MG tablet Take 0.5 tablets (25 mg  total) by mouth at bedtime as needed for sleep. 09/05/20   Tonia Ghent, MD    Allergies    Actos [pioglitazone hydrochloride], Aspirin, Ace inhibitors, Bactrim, Gabapentin, Lyrica [pregabalin], Pravastatin, and Sulfa drugs cross reactors  Review of Systems   Review of Systems  Constitutional:  Negative for chills and fever.  HENT:  Negative for ear pain and sore throat.   Eyes:  Negative for pain and visual disturbance.  Respiratory:  Positive for cough and shortness of breath.   Cardiovascular:  Negative for chest pain, palpitations and claudication.  Gastrointestinal:  Negative for abdominal pain and vomiting.  Genitourinary:  Negative for dysuria and hematuria.  Musculoskeletal:  Negative for arthralgias and back pain.  Skin:  Negative for color change and rash.  Neurological:  Positive for weakness. Negative for seizures, syncope and headaches.  All other systems reviewed and are negative.  Physical Exam Updated Vital Signs BP (!) 151/70   Pulse 84   Temp 98.5 F (36.9 C) (Oral)   Resp 18   Ht 6\' 2"  (1.88 m)   Wt 95.3 kg   SpO2 100%   BMI 26.96 kg/m   Physical Exam Vitals and nursing note reviewed.  Constitutional:      Appearance: Normal appearance.  HENT:     Head: Normocephalic and atraumatic.     Mouth/Throat:     Mouth: Mucous membranes are dry.  Eyes:     Comments: Scan yellow crust around eyes  Cardiovascular:     Rate and Rhythm: Normal rate and regular rhythm.     Heart sounds: Normal heart sounds.  Pulmonary:     Effort: Pulmonary effort is normal.     Breath sounds: Normal breath sounds.  Abdominal:     General: There is no distension.     Tenderness: There is no abdominal tenderness. There is no guarding.  Musculoskeletal:     Cervical back: Normal  range of motion.     Right lower leg: No edema.     Left lower leg: No edema.  Skin:    General: Skin is warm and dry.  Neurological:     General: No focal deficit present.     Mental Status:  He is alert and oriented to person, place, and time.     Comments: Some mild weakness when pulling himself forward to a seated position for lung exam.  Psychiatric:        Mood and Affect: Mood normal.        Behavior: Behavior normal.    ED Results / Procedures / Treatments   Labs (all labs ordered are listed, but only abnormal results are displayed) Labs Reviewed  CBC - Abnormal; Notable for the following components:      Result Value   MCV 108.3 (*)    All other components within normal limits  COMPREHENSIVE METABOLIC PANEL - Abnormal; Notable for the following components:   CO2 37 (*)    Glucose, Bld 161 (*)    BUN 41 (*)    Creatinine, Ser 1.74 (*)    AST 12 (*)    GFR, Estimated 37 (*)    All other components within normal limits  D-DIMER, QUANTITATIVE - Abnormal; Notable for the following components:   D-Dimer, Quant 1.26 (*)    All other components within normal limits  BRAIN NATRIURETIC PEPTIDE - Abnormal; Notable for the following components:   B Natriuretic Peptide 304.5 (*)    All other components within normal limits  TROPONIN I (HIGH SENSITIVITY) - Abnormal; Notable for the following components:   Troponin I (High Sensitivity) 315 (*)    All other components within normal limits  TROPONIN I (HIGH SENSITIVITY) - Abnormal; Notable for the following components:   Troponin I (High Sensitivity) 283 (*)    All other components within normal limits  RESP PANEL BY RT-PCR (FLU A&B, COVID) ARPGX2  HEPARIN LEVEL (UNFRACTIONATED)    EKG EKG Interpretation  Date/Time:  Monday June 11 2021 07:31:00 EDT Ventricular Rate:  86 PR Interval:  168 QRS Duration: 105 QT Interval:  371 QTC Calculation: 444 R Axis:   80 Text Interpretation: Sinus rhythm Low voltage, extremity leads normal axis No acute ischemia Confirmed by Lorre Munroe ([id number]) on 06/11/2021 8:06:04 AM  Radiology CT Head Wo Contrast  Result Date: 06/11/2021 CLINICAL DATA:  Mental status changes. EXAM: CT HEAD  WITHOUT CONTRAST TECHNIQUE: Contiguous axial images were obtained from the base of the skull through the vertex without intravenous contrast. COMPARISON:  04/24/2020 FINDINGS: Brain: There is atrophy and chronic small vessel disease changes. No acute intracranial abnormality. Specifically, no hemorrhage, hydrocephalus, mass lesion, acute infarction, or significant intracranial injury. Vascular: No hyperdense vessel or unexpected calcification. Skull: No acute calvarial abnormality. Sinuses/Orbits: No acute findings Other: None IMPRESSION: Atrophy, chronic microvascular disease. No acute intracranial abnormality. Electronically Signed   By: Rolm Baptise M.D.   On: 06/11/2021 08:11   CT Angio Chest PE W and/or Wo Contrast  Result Date: 06/11/2021 CLINICAL DATA:  PE suspected EXAM: CT ANGIOGRAPHY CHEST WITH CONTRAST TECHNIQUE: Multidetector CT imaging of the chest was performed using the standard protocol during bolus administration of intravenous contrast. Multiplanar CT image reconstructions and MIPs were obtained to evaluate the vascular anatomy. CONTRAST:  42mL OMNIPAQUE IOHEXOL 350 MG/ML SOLN COMPARISON:  CT chest dated Apr 06, 2005 FINDINGS: Cardiovascular: Normal heart size. No pericardial effusion. Three-vessel coronary artery calcifications. Atherosclerotic disease of the thoracic  aorta. No evidence of pulmonary embolus to the level of the proximal segmental pulmonary arteries. Adequate contrast opacification of the pulmonary arteries. Evaluation of the more distal pulmonary arteries is limited due to respiratory motion and streak artifact. Mediastinum/Nodes: No enlarged mediastinal, hilar, or axillary lymph nodes. Thyroid gland, trachea, and esophagus demonstrate no significant findings. Lungs/Pleura: Small right-greater-than-left pleural effusions with associated atelectasis centrilobular emphysema. Mild bronchial wall thickening, most pronounced in the lower lungs. Clustered nodules of the right lower  lobe seen on series 3 image 259. Linear opacities in the right lower lobe, likely due to scarring or atelectasis. No consolidation, pleural effusion or pneumothorax. Central airways are patent. Upper Abdomen: No acute abnormality. Musculoskeletal: No chest wall abnormality. No acute or significant osseous findings. Review of the MIP images confirms the above findings. IMPRESSION: No evidence of pulmonary embolus to the level of the proximal segmental pulmonary arteries. Evaluation of the more distal pulmonary arteries is limited due to respiratory motion and streak artifact. Bilateral lower lobe predominant bronchial wall thickening and clustered nodules of the right lower lobe, likely sequela of aspiration. Electronically Signed   By: Yetta Glassman MD   On: 06/11/2021 11:30   DG Chest Port 1 View  Result Date: 06/11/2021 CLINICAL DATA:  Shortness of breath. EXAM: PORTABLE CHEST 1 VIEW COMPARISON:  04/18/2020 FINDINGS: Heart size is normal. Bilateral lower lobe opacities identified concerning for atelectasis or pneumonia. Pulmonary vascular congestion without overt edema. No pleural effusions identified. IMPRESSION: Bilateral lower lobe opacities concerning for atelectasis or pneumonia. Electronically Signed   By: Kerby Moors M.D.   On: 06/11/2021 08:35    Procedures .Critical Care  Date/Time: 06/11/2021 12:53 PM Performed by: Arnaldo Natal, MD Authorized by: Arnaldo Natal, MD   Critical care provider statement:    Critical care time (minutes):  45   Critical care time was exclusive of:  Separately billable procedures and treating other patients and teaching time   Critical care was necessary to treat or prevent imminent or life-threatening deterioration of the following conditions:  Cardiac failure   Critical care was time spent personally by me on the following activities:  Blood draw for specimens, development of treatment plan with patient or surrogate, discussions with consultants,  evaluation of patient's response to treatment, examination of patient, obtaining history from patient or surrogate, ordering and performing treatments and interventions, pulse oximetry, ordering and review of laboratory studies, ordering and review of radiographic studies, re-evaluation of patient's condition and review of old charts   I assumed direction of critical care for this patient from another provider in my specialty: no     Care discussed with: admitting provider     Medications Ordered in ED Medications  heparin ADULT infusion 100 units/mL (25000 units/213mL) (1,150 Units/hr Intravenous New Bag/Given 06/11/21 1114)  heparin bolus via infusion 4,000 Units (4,000 Units Intravenous Bolus from Bag 06/11/21 1114)  iohexol (OMNIPAQUE) 350 MG/ML injection 70 mL (70 mLs Intravenous Contrast Given 06/11/21 1026)    ED Course  I have reviewed the triage vital signs and the nursing notes.  Pertinent labs & imaging results that were available during my care of the patient were reviewed by me and considered in my medical decision making (see chart for details).  Clinical Course as of 06/11/21 1512  Mon Jun 11, 2021  1421 Began to shake and was looking like he was decompensating.  Pulse ox would not pick up, and I ordered an ABG to correlate.  ABG detected hypercarbia, and  he will be placed on BiPAP. [AW]  1025 I talked to pulmonary critical care. Patient can be safely cared for a [place] in step down.  [AW]  1510 I spoke with Dr. Thereasa Solo for admission.  [AW]    Clinical Course User Index [AW] Arnaldo Natal, MD   MDM Rules/Calculators/A&P                           [name] Small presented with increased shortness of breath and increased O2 requirement.  He was evaluated for evidence of PE, ACS, CHF, pneumonia, COVID-19.  Initial troponin was elevated, and I placed him on a heparin drip.  However, I still maintained a relatively high suspicion for possible PE.  Fortunately, he did not have  evidence of PE.  Cardiology was consulted, and they will evaluate him for further management.  They did request an echocardiogram, and this has been ordered. Final Clinical Impression(s) / ED Diagnoses Final diagnoses:  NSTEMI (non-ST elevated myocardial infarction) ([place])  Type 2 diabetes mellitus with diabetic polyneuropathy, with long-term current use of insulin (HCC)  Stage 3 chronic kidney disease, unspecified whether stage 3a or 3b CKD ([place])    Rx / DC Orders ED Discharge Orders     None        Arnaldo Natal, MD 06/11/21 1254    Arnaldo Natal, MD 06/11/21 [id number]

## 2021-06-11 NOTE — ED Triage Notes (Signed)
Ems called out for generalized weakness today. Family reports pt is usually able to stand and pivot but today he can't. Family also reports oxygen being in the 80s when pt's oxygen was off through the night.

## 2021-06-11 NOTE — Progress Notes (Signed)
ANTICOAGULATION CONSULT NOTE - Initial Consult  Pharmacy Consult for heparin Indication:  ACS/STEMI  Allergies  Allergen Reactions   Actos [Pioglitazone Hydrochloride] Other (See Comments)    Held 2012 bladder cancer   Aspirin Other (See Comments)    Held 2012 due to hematuria and bruising.    Ace Inhibitors Cough   Bactrim Rash and Other (See Comments)    Rash, presumed allergy   Gabapentin Nausea And Vomiting    Upset stomach and diarrhea - tolerates low doses   Lyrica [Pregabalin] Swelling    swelling   Pravastatin Swelling    Swelling of feet   Sulfa Drugs Cross Reactors Rash    Patient Measurements: Height: 6\' 2"  (188 cm) Weight: 95.3 kg (210 lb) IBW/kg (Calculated) : 82.2 Heparin Dosing Weight: 95.3 kg  Vital Signs: Temp: 97.7 F (36.5 C) (08/01 1345) Temp Source: Oral (08/01 1345) BP: 176/78 (08/01 2041) Pulse Rate: 79 (08/01 2041)  Labs: Recent Labs    06/11/21 0732 06/11/21 0958 06/11/21 1331 06/11/21 1944  HGB 15.1  --   --   --   HCT 49.5  --   --   --   PLT 158  --   --   --   HEPARINUNFRC  --   --   --  0.16*  CREATININE 1.74*  --   --   --   TROPONINIHS 315* 283* 243*  --      Estimated Creatinine Clearance: 32.8 mL/min (A) (by C-G formula based on SCr of 1.74 mg/dL (H)).   Medical History: Past Medical History:  Diagnosis Date   (HFpEF) heart failure with preserved ejection fraction (HCC)    Arthritis    Back pain    s/p lumbar injection 2014   BENIGN PROSTATIC HYPERTROPHY 05/21/2007   Bladder cancer ([place]) 10/2011   CAD (coronary artery disease)    nonobstructive by cath 6/12:  mid LAD 30%, proximal obtuse marginal-2 30%, proximal RCA 20%, mid RCA 30-40%.  He had normal cardiac output and mildly elevated filling pressures but no significant pulmonary hypertension;   Echocardiogram in May 2012 demonstrated EF 50-55% and left atrial enlargement    Cellulitis of left foot    Hospitalized in 2006   Chronic kidney disease (CKD), stage III  (moderate) (HCC) 12/04/2007   FOLLOWED BY DR PATEL   Chronic pain of lower extremity    Complication of anesthesia    1 time bladder cancer surgery- 2012 , oxygen satruratioon dropped had to stay overnight, no problems since then.   COPD (chronic obstructive pulmonary disease) ([place])    DIABETES MELLITUS, TYPE II 05/21/2007   DIVERTICULOSIS, COLON 05/21/2007   pt denies   Dyspnea    with exertion, patient mointors saturation   Fatigue AGE-RELATED   Gross hematuria    pt denies   HYPERTENSION 05/21/2007   Impaired hearing BILATERAL HEARING AIDS   On home oxygen therapy    as needed   PAD (peripheral artery disease) ([place])    a. 02/2016 L foot nonhealing ulcer-->Periph angio: L pop 100 w/ reconstitution via extensive vollats to the prox-mid peroneal (only patent vessel BK)-->PTA of L Pop and peroneal; b. Ongoing LE ischemia 5 & 06/2016 req amputation of toes on L foot.   Pneumonia    Hx  of    Psoriasis ELBOWS   Psoriasis    PSORIASIS, SCALP 10/25/2008    Medications:  Scheduled:   chlorhexidine  15 mL Mouth Rinse BID   [START ON 06/12/2021] Chlorhexidine  Gluconate Cloth  6 each Topical Q0600   docusate sodium  100 mg Oral BID   doxycycline  50 mg Oral Daily   finasteride  5 mg Oral Daily   heparin  3,000 Units Intravenous NOW   insulin aspart  0-5 Units Subcutaneous QHS   insulin aspart  0-9 Units Subcutaneous TID WC   [START ON 06/12/2021] mouth rinse  15 mL Mouth Rinse q12n4p   pantoprazole  80 mg Oral Daily   tamsulosin  0.8 mg Oral Daily   [START ON 06/12/2021] umeclidinium bromide  1 puff Inhalation Daily    Assessment: 87 year old male presenting with generalized weakness and low O2 saturations into the 80's.  Elevated D-dimer and troponins on presentation.  CBC stable.  No AC PTA. Pharmacy consulted to dose heparin for possible ACS/STEMI.   This evening, 1944 Heparin level sub-therapeutic at 0.16 with heparin infusing at 1150 units/hour No bleeding or infusion related issues  reported by nurse  Goal of Therapy:  Heparin level 0.3-0.7 units/ml Monitor platelets by anticoagulation protocol: Yes   Plan:  Give heparin 3000 units bolus x 1 Increase heparin drip to 1450 units/hr F/u 8 hour HL  Daily CBC and HL Monitor for s/sx bleeding F/u cardiac workup  Lucylle Foulkes P. Legrand Como, PharmD, [place] Please utilize Amion for appropriate phone number to reach the unit pharmacist ([place]) 06/11/2021 9:12 PM

## 2021-06-11 NOTE — Consult Note (Addendum)
Cardiology Consultation:   Patient ID: [name] Small MRN: [id number]; DOB: 1927-11-21  Admit date: 06/11/2021 Date of Consult: 06/11/2021  PCP:  [name] Ghent, MD   [facility] Providers Cardiologist:  [name] Sacramento, MD        Patient Profile:   [name] Small is a 87 y.o. male with a hx of  PAD, non obstructive CAD, CKD-4, HTN DM-2, bladder cancer who is being seen 06/11/2021 for the evaluation of NSTEMI at the request of [name]. Joya Small.  History of Present Illness:   [name] Small with above hx and last cardiac cath 2012 with mild nonobstructive CAD,  normal CO, mildly elevated filling pressures with no evidence of significant pulmonary HTN.  Last nuc 2016 that was mildly abnormal but no need for cath.  Pt not seen by cardiology since 2020.  PAD   Now admitted for generalized weakness and low 02.  He wears home 02 at night.  Today his sp02 was in the 80s and he had increased fatigue.  No chest pain but does have a cough.    Hx of foot ulcer.  With; hx of PTA of Lt popliteal and peroneal artery  last dopplers in 2020  EKG:  The EKG was personally reviewed and demonstrates:  SR at 86, low voltage but no changes since 2021 and no acute ST changes Telemetry:  Telemetry was personally reviewed and demonstrates:  SR    Labs with Na 142, K+ 4.9, BUN 41, Cr 1.74  BNP 304 Hs Troponin 315 and 283.  Hgb 15.1, WBC 7.2 plts 158   Ddimer 1.26 and CTA of chest   Three-vessel coronary artery calcifications. Atherosclerotic disease of the thoracic aorta. No evidence of pulmonary embolus to the level of the proximal segmental pulmonary arteries Small rt>Lt pl effusions with associated atelectasis centrilobular emphysema, clustered nodules - Bilateral lower lobe predominant bronchial wall thickening and clustered nodules of the right lower lobe, likely sequela of aspiration.  Neg covid.  CT head no acute abnormality  PCXR  IMPRESSION: Bilateral lower lobe opacities concerning for atelectasis  or pneumonia.  No chest pain and no SOB he does have shaking of upper ext. At times.  He did not feel well last week but nothing specific then he did well Sat but early Sunday without 02 sp02 was 64% and 02 replaced.  EMS called but pt did not wish to go to hospital.  Then today sp02 low on 02 so came to hospital.  No chest pain. No real SOB     Past Medical History:  Diagnosis Date   (HFpEF) heart failure with preserved ejection fraction (HCC)    Arthritis    Back pain    s/p lumbar injection 2014   BENIGN PROSTATIC HYPERTROPHY 05/21/2007   Bladder cancer ([place]) 10/2011   CAD (coronary artery disease)    nonobstructive by cath 6/12:  mid LAD 30%, proximal obtuse marginal-2 30%, proximal RCA 20%, mid RCA 30-40%.  He had normal cardiac output and mildly elevated filling pressures but no significant pulmonary hypertension;   Echocardiogram in May 2012 demonstrated EF 50-55% and left atrial enlargement    Cellulitis of left foot    Hospitalized in 2006   Chronic kidney disease (CKD), stage III (moderate) (HCC) 12/04/2007   FOLLOWED BY [name] Small   Chronic pain of lower extremity    Complication of anesthesia    1 time bladder cancer surgery- 2012 , oxygen satruratioon dropped had to stay overnight, no problems since then.  COPD (chronic obstructive pulmonary disease) ([place])    DIABETES MELLITUS, TYPE II 05/21/2007   DIVERTICULOSIS, COLON 05/21/2007   pt denies   Dyspnea    with exertion, patient mointors saturation   Fatigue AGE-RELATED   Gross hematuria    pt denies   HYPERTENSION 05/21/2007   Impaired hearing BILATERAL HEARING AIDS   On home oxygen therapy    as needed   PAD (peripheral artery disease) ([place])    a. 02/2016 L foot nonhealing ulcer-->Periph angio: L pop 100 w/ reconstitution via extensive vollats to the prox-mid peroneal (only patent vessel BK)-->PTA of L Pop and peroneal; b. Ongoing LE ischemia 5 & 06/2016 req amputation of toes on L foot.   Pneumonia    Hx  of     Psoriasis ELBOWS   Psoriasis    PSORIASIS, SCALP 10/25/2008    Past Surgical History:  Procedure Laterality Date   AMPUTATION Left 03/27/2016   Procedure: partial first AMPUTATION RAY; LEFT;  Surgeon: [name] Small, DPM;  Location: [place];  Service: Podiatry;  Laterality: Left;   AMPUTATION Left 06/12/2016   Procedure: TRANSMETATARSAL AMPUTATION LEFT FOOT;  Surgeon: [name] Minion, MD;  Location: [place];  Service: Orthopedics;  Laterality: Left;   APPENDECTOMY  1941   BIOPSY  05/16/2020   Procedure: BIOPSY;  Surgeon: [name] Bullion, DO;  Location: [facility];  Service: Gastroenterology;;   CARDIAC CATHETERIZATION  04-24-11/  [name] [name] Small   MILD NONOBSTRUCTIVE CAD, NORMA CARDIAC OUTPUT   CARDIOVASCULAR STRESS TEST  2007   CATARACT EXTRACTION W/ INTRAOCULAR LENS  IMPLANT, BILATERAL Bilateral ~ 2010   CYSTOSCOPY  12/25/2011   Procedure: CYSTOSCOPY;  Surgeon: [name] Hazard, MD;  Location: [facility];  Service: Urology;  Laterality: N/A;  needs intubation and to be paralyzed    CYSTOSCOPY W/ RETROGRADES  10/30/2011   Procedure: CYSTOSCOPY WITH RETROGRADE PYELOGRAM;  Surgeon: [name] Hazard, MD;  Location: [facility];  Service: Urology;  Laterality: Bilateral;  CYSTOSCOPY POSS TURBT BILATERAL RETROGRADE PYLEOGRAM    CYSTOSCOPY W/ RETROGRADES  11/30/2012   Procedure: CYSTOSCOPY WITH RETROGRADE PYELOGRAM;  Surgeon: [name] Hazard, MD;  Location: [facility];  Service: Urology;  Laterality: Bilateral;  Flexible cystoscopy.   CYSTOSCOPY WITH BIOPSY  11/30/2012   Procedure: CYSTOSCOPY WITH BIOPSY;  Surgeon: [name] Hazard, MD;  Location: [facility];  Service: Urology;  Laterality: N/A;   ESOPHAGOGASTRODUODENOSCOPY (EGD) WITH PROPOFOL N/A 05/16/2020   Procedure: ESOPHAGOGASTRODUODENOSCOPY (EGD) WITH PROPOFOL;  Surgeon: [name] Bullion, DO;  Location: [facility];  Service: Gastroenterology;  Laterality:  N/A;   FEMUR IM NAIL Left 08/01/2020   Procedure: INTRAMEDULLARY (IM) RETROGRADE FEMORAL NAILING;  Surgeon: [name] Carson, MD;  Location: [place];  Service: Orthopedics;  Laterality: Left;   INCISION AND DRAINAGE FOOT Left ~ 2005   LEFT FOOT DUE TO INFECTION FROM  NAIL PUNCTURE INJURY   LUMBAR LAMINECTOMY/DECOMPRESSION MICRODISCECTOMY N/A 10/18/2016   Procedure: LEFT AND CENTRAL L4-5 LUMBAR LAMINECTOMY WITH RESECTION OF SYNOVIAL CYST;  Surgeon: Jessy Oto, MD;  Location: [place];  Service: Orthopedics;  Laterality: N/A;   ORIF FEMUR FRACTURE Left 04/19/2020   Procedure: OPEN REDUCTION INTERNAL FIXATION FEMORAL SHAFT FRACTURE;  Surgeon: Shona Needles, MD;  Location: [place];  Service: Orthopedics;  Laterality: Left;   PENILE PROSTHESIS IMPLANT  1990s   PERIPHERAL VASCULAR CATHETERIZATION N/A 02/14/2016   Procedure: Abdominal Aortogram w/Lower Extremity;  Surgeon: Wellington Hampshire, MD;  Location:  [place] [facility];  Service: Cardiovascular;  Laterality: N/A;   PERIPHERAL VASCULAR CATHETERIZATION Left 02/28/2016   Procedure: Peripheral Vascular Balloon Angioplasty;  Surgeon: Wellington Hampshire, MD;  Location: [place] [facility];  Service: Cardiovascular;  Laterality: Left;  left peroneal and popliteal artery   TONSILLECTOMY AND ADENOIDECTOMY  1962   TOTAL HIP ARTHROPLASTY Left 12/27/2016   Procedure: LEFT TOTAL HIP ARTHROPLASTY ANTERIOR APPROACH;  Surgeon: Mcarthur Rossetti, MD;  Location: [facility];  Service: Orthopedics;  Laterality: Left;   TRANSURETHRAL RESECTION OF BLADDER TUMOR  10/30/2011   Procedure: TRANSURETHRAL RESECTION OF BLADDER TUMOR (TURBT);  Surgeon: [name] Hazard, MD;  Location: [facility];  Service: Urology;  Laterality: N/A;   TRANSURETHRAL RESECTION OF BLADDER TUMOR  12/25/2011   Procedure: TRANSURETHRAL RESECTION OF BLADDER TUMOR (TURBT);  Surgeon: [name] Hazard, MD;  Location: [facility];  Service: Urology;  Laterality: N/A;  need long  gyrus instruments    VASECTOMY  1990s     Home Medications:  Prior to Admission medications   Medication Sig Start Date End Date Taking? Authorizing Provider  acetaminophen (TYLENOL) 325 MG tablet Take 1-2 tablets (325-650 mg total) by mouth every 4 (four) hours as needed for mild pain. 05/11/20  Yes Angiulli, Lavon Paganini, PA-C  Apremilast 30 MG TABS Take 1 tablet by mouth daily. Patient taking differently: Take 30 mg by mouth daily. 03/30/19  Yes [name] Ghent, MD  atorvastatin (LIPITOR) 10 MG tablet Take 1 tablet (10 mg total) by mouth daily. 05/11/20  Yes Angiulli, Lavon Paganini, PA-C  Cholecalciferol (VITAMIN D) 125 MCG (5000 UT) CAPS Take 1 capsule by mouth daily. Patient taking differently: Take 5,000 Units by mouth daily. 08/08/20  Yes Ainsley Spinner, PA-C  clobetasol (TEMOVATE) 0.05 % external solution Apply 1 application topically daily as needed (rash).    Yes [provider]  CRANBERRY PO Take 4 capsules by mouth daily. Takes AZO 2 tabs BID   Yes [provider]  docusate sodium (COLACE) 100 MG capsule Take 100 mg by mouth daily as needed for mild constipation.   Yes [provider]  doxycycline (ADOXA) 50 MG tablet Take 50 mg by mouth daily.   Yes [provider]  finasteride (PROSCAR) 5 MG tablet Take 5 mg by mouth daily.   Yes [provider]  fluticasone-salmeterol (ADVAIR) 250-50 MCG/ACT AEPB Take 1 puff by mouth 2 (two) times daily as needed (shortness of breath). 01/16/21  Yes [provider]  insulin glargine (LANTUS) 100 UNIT/ML Solostar Pen Inject 15 Units into the skin daily. 03/23/21  Yes [name] Ghent, MD  Multiple Vitamin (MULTIVITAMIN WITH MINERALS) TABS tablet Take 1 tablet by mouth daily. 05/11/20  Yes Angiulli, Lavon Paganini, PA-C  omeprazole (PRILOSEC) 40 MG capsule TAKE 1 CAPSULE (40 MG TOTAL) BY MOUTH DAILY. 12/27/20  Yes [name] Ghent, MD  tamsulosin (FLOMAX) 0.4 MG CAPS capsule TAKE 2 CAPSULES BY MOUTH DAILY AFTER  SUPPER. Patient taking differently: Take 0.8 mg by mouth daily. 05/30/21  Yes [name] Ghent, MD  tiotropium (SPIRIVA) 18 MCG inhalation capsule Place 18 mcg into inhaler and inhale daily.   Yes [provider]  traMADol (ULTRAM) 50 MG tablet Take 1 tablet (50 mg total) by mouth every 8 (eight) hours as needed for moderate pain or severe pain. 08/08/20  Yes Ainsley Spinner, PA-C  traZODone (DESYREL) 50 MG tablet Take 0.5 tablets (25 mg total) by mouth at bedtime as needed for sleep.  09/05/20  Yes [name] Ghent, MD  calcium carbonate (OS-CAL - DOSED IN MG OF ELEMENTAL CALCIUM) 1250 (500 Ca) MG tablet Take 1 tablet (500 mg of elemental calcium total) by mouth daily with breakfast. Patient not taking: No sig reported 05/11/20   Angiulli, Lavon Paganini, PA-C  escitalopram (LEXAPRO) 5 MG tablet Take 1 tablet (5 mg total) by mouth daily. Patient not taking: No sig reported 05/11/20   Angiulli, Lavon Paganini, PA-C  loperamide (IMODIUM A-D) 2 MG tablet Take 1 tablet (2 mg total) by mouth 4 (four) times daily as needed for diarrhea or loose stools. Patient not taking: No sig reported 11/15/20   [name] Ghent, MD    Inpatient Medications: Scheduled Meds:  Continuous Infusions:  heparin 1,150 Units/hr (06/11/21 1114)   PRN Meds:   Allergies:    Allergies  Allergen Reactions   Actos [Pioglitazone Hydrochloride] Other (See Comments)    Held 2012 bladder cancer   Aspirin Other (See Comments)    Held 2012 due to hematuria and bruising.    Ace Inhibitors Cough   Bactrim Rash and Other (See Comments)    Rash, presumed allergy   Gabapentin Nausea And Vomiting    Upset stomach and diarrhea - tolerates low doses   Lyrica [Pregabalin] Swelling    swelling   Pravastatin Swelling    Swelling of feet   Sulfa Drugs Cross Reactors Rash    Social History:   Social History   Socioeconomic History   Marital status: Married    Spouse name: Not on file   Number of children: 4   Years of education: Not on  file   Highest education level: Not on file  Occupational History   Occupation: retired     Comment: [employer]	[place].  Tobacco Use   Smoking status: Former    Packs/day: 2.00    Years: 22.00    Pack years: 44.00    Types: Cigarettes    Quit date: 11/11/1968    Years since quitting: 52.6   Smokeless tobacco: Never  Vaping Use   Vaping Use: Never used  Substance and Sexual Activity   Alcohol use: Yes    Comment: occassional- 10/17/16- none  in 4 months- "too sick"   Drug use: No   Sexual activity: Not Currently  Other Topics Concern   Not on file  Social History Narrative   Retired Event organiser.   Active with golf.   [place] [phone].     Social Determinants of Health   Financial Resource Strain: Low Risk    Difficulty of Paying Living Expenses: Not very hard  Food Insecurity: Not on file  Transportation Needs: Not on file  Physical Activity: Not on file  Stress: Not on file  Social Connections: Not on file  Intimate Partner Violence: Not on file    Family History:    Family History  Problem Relation Age of Onset   Diabetes Mother    Heart disease Brother    Heart disease Brother    Heart disease Brother    Heart disease Brother    Heart disease Brother    Heart disease Brother    Lung cancer Brother      ROS:  Please see the history of present illness.  General:no colds or fevers, no weight changes Skin:no rashes or ulcers HEENT:no blurred vision, no congestion CV:see HPI PUL:see HPI GI:no diarrhea constipation or melena, no indigestion GU:no hematuria, no dysuria MS:no joint pain, no claudication  Neuro:no syncope, no lightheadedness Endo:+ diabetes, no thyroid disease  All other ROS reviewed and negative.     Physical Exam/Data:   Vitals:   06/11/21 1000 06/11/21 1115 06/11/21 1130 06/11/21 1200  BP: (!) 142/79 140/76 (!) 152/77 (!) 147/73  Pulse: 78 84 81 81  Resp: 19 (!) 22 (!) 28 (!) 25  Temp:      TempSrc:      SpO2: 96%  96% 100% 98%  Weight:      Height:       No intake or output data in the 24 hours ending 06/11/21 1257 Last 3 Weights 06/11/2021 11/07/2020 09/04/2020  Weight (lbs) 210 lb (No Data) 205 lb  Weight (kg) 95.255 kg (No Data) 92.987 kg     Body mass index is 26.96 kg/m.  General:  Well nourished, well developed, in no acute distress HEENT: normal Lymph: no adenopathy Neck: no JVD Endocrine:  No thryomegaly Vascular: No carotid bruits; pedal pulses ? bilaterally   Cardiac:  normal S1, S2; RRR; no murmur gallup rub or click Lungs:  diminished, decreased breath sounds to auscultation bilaterally, no wheezing, rhonchi or rales  Abd: soft, nontender, no hepatomegaly  Ext: 1+ edema of feet Musculoskeletal:  toes amputation on Lt foot. , BUE and BLE strength normal and equal Skin: warm and dry  Neuro:  answers questions but laughs inappropriately at times, no focal abnormalities noted, grips are equal.   Psych:  Normal affect   Relevant CV Studies: Echo pending  Laboratory Data:  High Sensitivity Troponin:   Recent Labs  Lab 06/11/21 0732 06/11/21 0958  TROPONINIHS 315* 283*     Chemistry Recent Labs  Lab 06/11/21 0732  NA 142  K 4.9  CL 98  CO2 37*  GLUCOSE 161*  BUN 41*  CREATININE 1.74*  CALCIUM 9.1  GFRNONAA 37*  ANIONGAP 7    Recent Labs  Lab 06/11/21 0732  PROT 6.7  ALBUMIN 3.6  AST 12*  ALT 9  ALKPHOS 52  BILITOT 0.7   Hematology Recent Labs  Lab 06/11/21 0732  WBC 7.2  RBC 4.57  HGB 15.1  HCT 49.5  MCV 108.3*  MCH 33.0  MCHC 30.5  RDW 13.4  PLT 158   BNP Recent Labs  Lab 06/11/21 0732  BNP 304.5*    DDimer  Recent Labs  Lab 06/11/21 0732  DDIMER 1.26*     Radiology/Studies:  CT Head Wo Contrast  Result Date: 06/11/2021 CLINICAL DATA:  Mental status changes. EXAM: CT HEAD WITHOUT CONTRAST TECHNIQUE: Contiguous axial images were obtained from the base of the skull through the vertex without intravenous contrast. COMPARISON:   04/24/2020 FINDINGS: Brain: There is atrophy and chronic small vessel disease changes. No acute intracranial abnormality. Specifically, no hemorrhage, hydrocephalus, mass lesion, acute infarction, or significant intracranial injury. Vascular: No hyperdense vessel or unexpected calcification. Skull: No acute calvarial abnormality. Sinuses/Orbits: No acute findings Other: None IMPRESSION: Atrophy, chronic microvascular disease. No acute intracranial abnormality. Electronically Signed   By: Rolm Baptise M.D.   On: 06/11/2021 08:11   CT Angio Chest PE W and/or Wo Contrast  Result Date: 06/11/2021 CLINICAL DATA:  PE suspected EXAM: CT ANGIOGRAPHY CHEST WITH CONTRAST TECHNIQUE: Multidetector CT imaging of the chest was performed using the standard protocol during bolus administration of intravenous contrast. Multiplanar CT image reconstructions and MIPs were obtained to evaluate the vascular anatomy. CONTRAST:  61mL OMNIPAQUE IOHEXOL 350 MG/ML SOLN COMPARISON:  CT chest dated Apr 06, 2005 FINDINGS: Cardiovascular: Normal  heart size. No pericardial effusion. Three-vessel coronary artery calcifications. Atherosclerotic disease of the thoracic aorta. No evidence of pulmonary embolus to the level of the proximal segmental pulmonary arteries. Adequate contrast opacification of the pulmonary arteries. Evaluation of the more distal pulmonary arteries is limited due to respiratory motion and streak artifact. Mediastinum/Nodes: No enlarged mediastinal, hilar, or axillary lymph nodes. Thyroid gland, trachea, and esophagus demonstrate no significant findings. Lungs/Pleura: Small right-greater-than-left pleural effusions with associated atelectasis centrilobular emphysema. Mild bronchial wall thickening, most pronounced in the lower lungs. Clustered nodules of the right lower lobe seen on series 3 image 259. Linear opacities in the right lower lobe, likely due to scarring or atelectasis. No consolidation, pleural effusion or  pneumothorax. Central airways are patent. Upper Abdomen: No acute abnormality. Musculoskeletal: No chest wall abnormality. No acute or significant osseous findings. Review of the MIP images confirms the above findings. IMPRESSION: No evidence of pulmonary embolus to the level of the proximal segmental pulmonary arteries. Evaluation of the more distal pulmonary arteries is limited due to respiratory motion and streak artifact. Bilateral lower lobe predominant bronchial wall thickening and clustered nodules of the right lower lobe, likely sequela of aspiration. Electronically Signed   By: Yetta Glassman MD   On: 06/11/2021 11:30   DG Chest Port 1 View  Result Date: 06/11/2021 CLINICAL DATA:  Shortness of breath. EXAM: PORTABLE CHEST 1 VIEW COMPARISON:  04/18/2020 FINDINGS: Heart size is normal. Bilateral lower lobe opacities identified concerning for atelectasis or pneumonia. Pulmonary vascular congestion without overt edema. No pleural effusions identified. IMPRESSION: Bilateral lower lobe opacities concerning for atelectasis or pneumonia. Electronically Signed   By: Kerby Moors M.D.   On: 06/11/2021 08:35     Assessment and Plan:   Acute respiratory failure may be due to aspiration PNA-  though WBC is normal. Afebrile.  No acute HF on CXR though BNP is up slightly. He does have emphysema.  Pt has some foot edema but no fluid or JVD, may be related to his COPD- 2015 EF was 50-55%  Elevated troponin to 300 and no acute EKG changes. Could be demand ischemia from respiratory failure.  He is on IV heparin now.  Neg PE. Would check another troponin but if flat would stop heparin.  Non obstructive CAD on cath 2012 - on CTA of chest 3 vessel coronary artery calcifications.   HTN on no meds HLD on lipitor  DM-2 on inulin per IM PAD with PTA of lt popliteal and peroneal arteries then amputation of toes on left foot. Had ulcer, no resolved.    Risk Assessment/Risk Scores:    For questions or updates,  please contact [place] Please consult [email or web address] for contact info under    Signed, Cecilie Kicks, NP  06/11/2021 12:57 PM   Patient seen and examined with Cecilie Kicks, NP.  Agree as above, with the following exceptions and changes as noted below.  Mr. Gaspar Bidding is a 87 year old male with peripheral arterial disease, nonobstructive CAD historically from a cath 10 years ago, CKD stage IV, hypertension, diabetes mellitus type 2, and history of bladder cancer seen for elevated troponin in the setting of hypoxia.  His wife is at the bedside and notes that his oxygen was off at home recently and his oxygen saturations were in the 60s.  He is also noted to be generally weak.  He wears home oxygen at night.  He has had increasing fatigue.  No fevers of note.  Imaging thus far suggest possible atelectasis versus  aspiration, wife cannot recall an episode of choking or known aspiration events. Gen: Mildly distressed but stable, CV: RRR, no murmurs, Lungs: clear anteriorly,, Abd: soft, Extrem: Warm, well perfused, no edema, Neuro/Psych: alert and oriented x 3, normal mood and affect. All available labs, radiology testing, previous records reviewed.  I have independently reviewed the images from the CT angio chest performed on admission.  He has small bilateral pleural effusions right greater than left and on echocardiogram which I have also reviewed at the bedside with the sonographer he is noted to have grade 2 diastolic dysfunction.  BNP is mildly elevated from prior, troponin is mildly elevated and flat in the setting of mild renal dysfunction.  Unclear what was the nidus for acute hypoxic respiratory failure, however he may have had a slight decompensation of diastolic function, therefore I will give him 1 dose of 80 mg of Lasix given baseline renal function this dose is most appropriate.  Elevated troponin may be secondary to acute hypoxic respiratory failure in combination with mild diastolic heart failure.   Troponins have been flat, I am less suspicious for ACS and I have described this in detail for the patient's wife.  I suspect demand ischemia due to the above-mentioned issues.  I would recommend discontinuing heparin since this is unlikely to represent ACS.  Cardiology will follow. Elouise Munroe, MD 06/11/21 6:16 PM

## 2021-06-11 NOTE — H&P (Signed)
ADMISSION HISTORY AND PHYSICAL   [name] Small NWG:[id number] DOB: [birth year] DOA: 06/11/2021  PCP: Tonia Ghent, MD Patient coming from: home  Chief Complaint: "low oxygen"  HPI:  87yo w/ a hx of nonobstructive CAD (cath 2012), PAD, CKD stage 3, HTN, DM2, and bladder CA who presented to the ED w/ the acute onset of generalized weakness and hypoxia. He requires nocturnal 2L [place] O2 support at baseline, but noted sats in the 80s while awake this morning. He was feeling fatigued in association with this finding. He admitted to not feeling well since at least 05/31/21 with poor intake of solids or liquids, generalized weakness, and decreased UOP (via his usual I/O caths).   In the ER he was noted to have elevated troponins and an elevated d-dimer. CTa revealed no large PE, but did not R > L LL infiltrates suggestive of a possible aspiration. His resp difficutly worsened during his ER stay, requiring use of BiPAP to maintain sats at 90% or >.    Assessment/Plan  Acute hypoxic and hypercarbic respiratory failure  Exact etiology unclear at this time -pneumonitis high on the differential likely related to transient episode of aspiration -is tolerating BiPAP very well at present -admit to stepdown bed but attempt to wean off BiPAP rapidly  Possible RLL aspiration pneumonitis  No clear evidence of active infection at present -hold off on antibiotics -monitor for evidence of dysphagia once off BiPAP and cleared for diet  NSTEMI Likely stress-induced in setting of respiratory distress -cardiology following -anticipate heparin to be discontinued and short course  Elevated d-dimer CTa chest negative for PE -check venous duplex to rule out DVTs  CKD Stage 3 Baseline crt ~1.8-2.3 -renal function appears stable for now -monitor trend  DM2 Utilize SSI and follow CBGs closely  PAD No acute complications presently  Macrocytosis Given advanced age is at risk for pernicious anemia -check V78  and folic acid levels  DVT prophylaxis: IV heparin Code Status: NO CODE BLUE confirmed with patient and wife Family Communication: Spoke with wife at bedside Disposition Plan:  Admit to Inpatient  Consults called: none indicated  Review of Systems: As per HPI otherwise 10 point review of systems negative.   Past Medical History:  Diagnosis Date   (HFpEF) heart failure with preserved ejection fraction (HCC)    Arthritis    Back pain    s/p lumbar injection 2014   BENIGN PROSTATIC HYPERTROPHY 05/21/2007   Bladder cancer ([place]) 10/2011   CAD (coronary artery disease)    nonobstructive by cath 6/12:  mid LAD 30%, proximal obtuse marginal-2 30%, proximal RCA 20%, mid RCA 30-40%.  He had normal cardiac output and mildly elevated filling pressures but no significant pulmonary hypertension;   Echocardiogram in May 2012 demonstrated EF 50-55% and left atrial enlargement    Cellulitis of left foot    Hospitalized in 2006   Chronic kidney disease (CKD), stage III (moderate) (HCC) 12/04/2007   FOLLOWED BY DR PATEL   Chronic pain of lower extremity    Complication of anesthesia    1 time bladder cancer surgery- 2012 , oxygen satruratioon dropped had to stay overnight, no problems since then.   COPD (chronic obstructive pulmonary disease) ([place])    DIABETES MELLITUS, TYPE II 05/21/2007   DIVERTICULOSIS, COLON 05/21/2007   pt denies   Dyspnea    with exertion, patient mointors saturation   Fatigue AGE-RELATED   Gross hematuria    pt denies   HYPERTENSION 05/21/2007  Impaired hearing BILATERAL HEARING AIDS   On home oxygen therapy    as needed   PAD (peripheral artery disease) ([place])    a. 02/2016 L foot nonhealing ulcer-->Periph angio: L pop 100 w/ reconstitution via extensive vollats to the prox-mid peroneal (only patent vessel BK)-->PTA of L Pop and peroneal; b. Ongoing LE ischemia 5 & 06/2016 req amputation of toes on L foot.   Pneumonia    Hx  of    Psoriasis ELBOWS   Psoriasis     PSORIASIS, SCALP 10/25/2008    Past Surgical History:  Procedure Laterality Date   AMPUTATION Left 03/27/2016   Procedure: partial first AMPUTATION RAY; LEFT;  Surgeon: Trula Slade, DPM;  Location: [place];  Service: Podiatry;  Laterality: Left;   AMPUTATION Left 06/12/2016   Procedure: TRANSMETATARSAL AMPUTATION LEFT FOOT;  Surgeon: Newt Minion, MD;  Location: [place];  Service: Orthopedics;  Laterality: Left;   APPENDECTOMY  1941   BIOPSY  05/16/2020   Procedure: BIOPSY;  Surgeon: Lavena Bullion, DO;  Location: [facility];  Service: Gastroenterology;;   CARDIAC CATHETERIZATION  04-24-11/  DR Rogue Jury ARIDA   MILD NONOBSTRUCTIVE CAD, [place]MA CARDIAC OUTPUT   CARDIOVASCULAR STRESS TEST  2007   CATARACT EXTRACTION W/ INTRAOCULAR LENS  IMPLANT, BILATERAL Bilateral ~ 2010   CYSTOSCOPY  12/25/2011   Procedure: CYSTOSCOPY;  Surgeon: Molli Hazard, MD;  Location: [facility];  Service: Urology;  Laterality: N/A;  needs intubation and to be paralyzed    CYSTOSCOPY W/ RETROGRADES  10/30/2011   Procedure: CYSTOSCOPY WITH RETROGRADE PYELOGRAM;  Surgeon: Molli Hazard, MD;  Location[facility];  Service: Urology;  Laterality: Bilateral;  CYSTOSCOPY POSS TURBT BILATERAL RETROGRADE PYLEOGRAM    CYSTOSCOPY W/ RETROGRADES  11/30/2012   Procedure: CYSTOSCOPY WITH RETROGRADE PYELOGRAM;  Surgeon: Molli Hazard, MD;  Location: [facility];  Service: Urology;  Laterality: Bilateral;  Flexible cystoscopy.   CYSTOSCOPY WITH BIOPSY  11/30/2012   Procedure: CYSTOSCOPY WITH BIOPSY;  Surgeon: Molli Hazard, MD;  Location: [facility];  Service: Urology;  Laterality: N/A;   ESOPHAGOGASTRODUODENOSCOPY (EGD) WITH PROPOFOL N/A 05/16/2020   Procedure: ESOPHAGOGASTRODUODENOSCOPY (EGD) WITH PROPOFOL;  Surgeon: Lavena Bullion, DO;  [facility];  Service: Gastroenterology;  Laterality: N/A;   FEMUR IM NAIL Left  08/01/2020   Procedure: INTRAMEDULLARY (IM) RETROGRADE FEMORAL NAILING;  Surgeon: Altamese Darling, MD;  Location: [place];  Service: Orthopedics;  Laterality: Left;   INCISION AND DRAINAGE FOOT Left ~ 2005   LEFT FOOT DUE TO INFECTION FROM  NAIL PUNCTURE INJURY   LUMBAR LAMINECTOMY/DECOMPRESSION MICRODISCECTOMY N/A 10/18/2016   Procedure: LEFT AND CENTRAL L4-5 LUMBAR LAMINECTOMY WITH RESECTION OF SYNOVIAL CYST;  Surgeon: Jessy Oto, MD;  Location: [place];  Service: Orthopedics;  Laterality: N/A;   ORIF FEMUR FRACTURE Left 04/19/2020   Procedure: OPEN REDUCTION INTERNAL FIXATION FEMORAL SHAFT FRACTURE;  Surgeon: Shona Needles, MD;  Location: [place];  Service: Orthopedics;  Laterality: Left;   PENILE PROSTHESIS IMPLANT  1990s   PERIPHERAL VASCULAR CATHETERIZATION N/A 02/14/2016   Procedure: Abdominal Aortogram w/Lower Extremity;  Surgeon: Wellington Hampshire, MD;  Location: [place] [facility];  Service: Cardiovascular;  Laterality: N/A;   PERIPHERAL VASCULAR CATHETERIZATION Left 02/28/2016   Procedure: Peripheral Vascular Balloon Angioplasty;  Surgeon: Wellington Hampshire, MD;  Location: [place] [facility];  Service: Cardiovascular;  Laterality: Left;  left peroneal and popliteal artery   TONSILLECTOMY AND ADENOIDECTOMY  [place] ARTHROPLASTY Left 12/27/2016   Procedure: LEFT TOTAL HIP ARTHROPLASTY ANTERIOR APPROACH;  Surgeon: Mcarthur Rossetti, MD;  Location: [facility];  Service: Orthopedics;  Laterality: Left;   TRANSURETHRAL RESECTION OF BLADDER TUMOR  10/30/2011   Procedure: TRANSURETHRAL RESECTION OF BLADDER TUMOR (TURBT);  Surgeon: Molli Hazard, MD;  Location: [facility];  Service: Urology;  Laterality: N/A;   TRANSURETHRAL RESECTION OF BLADDER TUMOR  12/25/2011   Procedure: TRANSURETHRAL RESECTION OF BLADDER TUMOR (TURBT);  Surgeon: Molli Hazard, MD;  Location: [facility];  Service: Urology;  Laterality: N/A;  need long gyrus instruments     VASECTOMY  1990s    Family History  Family History  Problem Relation Age of Onset   Diabetes Mother    Heart disease Brother    Heart disease Brother    Heart disease Brother    Heart disease Brother    Heart disease Brother    Heart disease Brother    Lung cancer Brother     Social History   reports that he quit smoking about 52 years ago. His smoking use included cigarettes. He has a 44.00 pack-year smoking history. He has never used smokeless tobacco. He reports current alcohol use. He reports that he does not use drugs.  Allergies Allergies  Allergen Reactions   Actos [Pioglitazone Hydrochloride] Other (See Comments)    Held 2012 bladder cancer   Aspirin Other (See Comments)    Held 2012 due to hematuria and bruising.    Ace Inhibitors Cough   Bactrim Rash and Other (See Comments)    Rash, presumed allergy   Gabapentin Nausea And Vomiting    Upset stomach and diarrhea - tolerates low doses   Lyrica [Pregabalin] Swelling    swelling   Pravastatin Swelling    Swelling of feet   Sulfa Drugs Cross Reactors Rash    Prior to Admission medications   Medication Sig Start Date End Date Taking? Authorizing Provider  acetaminophen (TYLENOL) 325 MG tablet Take 1-2 tablets (325-650 mg total) by mouth every 4 (four) hours as needed for mild pain. 05/11/20  Yes Angiulli, Lavon Paganini, PA-C  Apremilast 30 MG TABS Take 1 tablet by mouth daily. Patient taking differently: Take 30 mg by mouth daily. 03/30/19  Yes Tonia Ghent, MD  atorvastatin (LIPITOR) 10 MG tablet Take 1 tablet (10 mg total) by mouth daily. 05/11/20  Yes Angiulli, Lavon Paganini, PA-C  Cholecalciferol (VITAMIN D) 125 MCG (5000 UT) CAPS Take 1 capsule by mouth daily. Patient taking differently: Take 5,000 Units by mouth daily. 08/08/20  Yes Ainsley Spinner, PA-C  clobetasol (TEMOVATE) 0.05 % external solution Apply 1 application topically daily as needed (rash).    Yes [provider]  CRANBERRY PO Take 4 capsules by mouth  daily. Takes AZO 2 tabs BID   Yes [provider]  docusate sodium (COLACE) 100 MG capsule Take 100 mg by mouth daily as needed for mild constipation.   Yes [provider]  doxycycline (ADOXA) 50 MG tablet Take 50 mg by mouth daily.   Yes [provider]  finasteride (PROSCAR) 5 MG tablet Take 5 mg by mouth daily.   Yes [provider]  fluticasone-salmeterol (ADVAIR) 250-50 MCG/ACT AEPB Take 1 puff by mouth 2 (two) times daily as needed (shortness of breath). 01/16/21  Yes [provider]  insulin glargine (LANTUS) 100 UNIT/ML Solostar Pen Inject 15 Units into the skin daily. 03/23/21  Yes Damita Dunnings,  Elveria Rising, MD  Multiple Vitamin (MULTIVITAMIN WITH MINERALS) TABS tablet Take 1 tablet by mouth daily. 05/11/20  Yes Angiulli, Lavon Paganini, PA-C  omeprazole (PRILOSEC) 40 MG capsule TAKE 1 CAPSULE (40 MG TOTAL) BY MOUTH DAILY. 12/27/20  Yes Tonia Ghent, MD  tamsulosin (FLOMAX) 0.4 MG CAPS capsule TAKE 2 CAPSULES BY MOUTH DAILY AFTER SUPPER. Patient taking differently: Take 0.8 mg by mouth daily. 05/30/21  Yes Tonia Ghent, MD  tiotropium (SPIRIVA) 18 MCG inhalation capsule Place 18 mcg into inhaler and inhale daily.   Yes [provider]  traMADol (ULTRAM) 50 MG tablet Take 1 tablet (50 mg total) by mouth every 8 (eight) hours as needed for moderate pain or severe pain. 08/08/20  Yes Ainsley Spinner, PA-C  traZODone (DESYREL) 50 MG tablet Take 0.5 tablets (25 mg total) by mouth at bedtime as needed for sleep. 09/05/20  Yes Tonia Ghent, MD  calcium carbonate (OS-CAL - DOSED IN MG OF ELEMENTAL CALCIUM) 1250 (500 Ca) MG tablet Take 1 tablet (500 mg of elemental calcium total) by mouth daily with breakfast. Patient not taking: No sig reported 05/11/20   Angiulli, Lavon Paganini, PA-C  escitalopram (LEXAPRO) 5 MG tablet Take 1 tablet (5 mg total) by mouth daily. Patient not taking: No sig reported 05/11/20   Angiulli, Lavon Paganini, PA-C  loperamide (IMODIUM A-D) 2 MG  tablet Take 1 tablet (2 mg total) by mouth 4 (four) times daily as needed for diarrhea or loose stools. Patient not taking: No sig reported 11/15/20   Tonia Ghent, MD    Physical Exam: Vitals:   06/11/21 1345 06/11/21 1356 06/11/21 1400 06/11/21 1430  BP:   (!) 147/101 (!) 157/77  Pulse:  80 79 80  Resp:  (!) 21 20 17   Temp: 97.7 F (36.5 C)     TempSrc: Oral     SpO2:  99% 97% 99%  Weight:      Height:        Constitutional: Tolerating BiPAP remarkably well, alert and interactive though communication difficult due to BiPAP and HOH Eyes: PERRL, lids and conjunctivae normal ENMT: Mucous membranes are dry.  Neck: normal, supple, no masses, no thyromegaly Respiratory: Bibasilar crackles right greater than left with no wheezing and good air movement throughout other fields Cardiovascular: Regular rate and rhythm, no murmurs / rubs / gallops.  Trace edema bilateral lower extremities. 2+ pedal pulses.  Abdomen: No tenderness or masses to palpation. No hepatosplenomegaly. Bowel sounds positive. Not distended. Soft.  Musculoskeletal: No clubbing / cyanosis. No joint deformity upper and lower extremities. No contractures. Normal muscle tone.  Skin: No rashes, lesions, ulcers.  Neurologic: CN 2-12 grossly intact B. Sensation intact.  Psychiatric: Normal judgment and insight. Normal mood.  Alert   Labs on Admission:   CBC: Recent Labs  Lab 06/11/21 0732  WBC 7.2  HGB 15.1  HCT 49.5  MCV 108.3*  PLT 854   Basic Metabolic Panel: Recent Labs  Lab 06/11/21 0732  NA 142  K 4.9  CL 98  CO2 37*  GLUCOSE 161*  BUN 41*  CREATININE 1.74*  CALCIUM 9.1   GFR: Estimated Creatinine Clearance: 32.8 mL/min (A) (by C-G formula based on SCr of 1.74 mg/dL (H)). Liver Function Tests: Recent Labs  Lab 06/11/21 0732  AST 12*  ALT 9  ALKPHOS 52  BILITOT 0.7  PROT 6.7  ALBUMIN 3.6    Urine analysis:    Component Value Date/Time   COLORURINE YELLOW 07/31/2020 1659  APPEARANCEUR TURBID (A) 07/31/2020 1659   LABSPEC 1.012 07/31/2020 1659   PHURINE 6.0 07/31/2020 1659   GLUCOSEU NEGATIVE 07/31/2020 1659   GLUCOSEU 250 (A) 06/09/2020 1331   HGBUR MODERATE (A) 07/31/2020 1659   HGBUR trace-intact 11/25/2007 0937   BILIRUBINUR NEGATIVE 07/31/2020 1659   KETONESUR NEGATIVE 07/31/2020 1659   PROTEINUR 100 (A) 07/31/2020 1659   UROBILINOGEN 0.2 06/09/2020 1331   NITRITE NEGATIVE 07/31/2020 1659   LEUKOCYTESUR MODERATE (A) 07/31/2020 1659     Radiological Exams on Admission: CT Head Wo Contrast  Result Date: 06/11/2021 CLINICAL DATA:  Mental status changes. EXAM: CT HEAD WITHOUT CONTRAST TECHNIQUE: Contiguous axial images were obtained from the base of the skull through the vertex without intravenous contrast. COMPARISON:  04/24/2020 FINDINGS: Brain: There is atrophy and chronic small vessel disease changes. No acute intracranial abnormality. Specifically, no hemorrhage, hydrocephalus, mass lesion, acute infarction, or significant intracranial injury. Vascular: No hyperdense vessel or unexpected calcification. Skull: No acute calvarial abnormality. Sinuses/Orbits: No acute findings Other: None IMPRESSION: Atrophy, chronic microvascular disease. No acute intracranial abnormality. Electronically Signed   By: Rolm Baptise M.D.   On: 06/11/2021 08:11   CT Angio Chest PE W and/or Wo Contrast  Result Date: 06/11/2021 CLINICAL DATA:  PE suspected EXAM: CT ANGIOGRAPHY CHEST WITH CONTRAST TECHNIQUE: Multidetector CT imaging of the chest was performed using the standard protocol during bolus administration of intravenous contrast. Multiplanar CT image reconstructions and MIPs were obtained to evaluate the vascular anatomy. CONTRAST:  75mL OMNIPAQUE IOHEXOL 350 MG/ML SOLN COMPARISON:  CT chest dated Apr 06, 2005 FINDINGS: Cardiovascular: Normal heart size. No pericardial effusion. Three-vessel coronary artery calcifications. Atherosclerotic disease of the thoracic aorta. No  evidence of pulmonary embolus to the level of the proximal segmental pulmonary arteries. Adequate contrast opacification of the pulmonary arteries. Evaluation of the more distal pulmonary arteries is limited due to respiratory motion and streak artifact. Mediastinum/Nodes: No enlarged mediastinal, hilar, or axillary lymph nodes. Thyroid gland, trachea, and esophagus demonstrate no significant findings. Lungs/Pleura: Small right-greater-than-left pleural effusions with associated atelectasis centrilobular emphysema. Mild bronchial wall thickening, most pronounced in the lower lungs. Clustered nodules of the right lower lobe seen on series 3 image 259. Linear opacities in the right lower lobe, likely due to scarring or atelectasis. No consolidation, pleural effusion or pneumothorax. Central airways are patent. Upper Abdomen: No acute abnormality. Musculoskeletal: No chest wall abnormality. No acute or significant osseous findings. Review of the MIP images confirms the above findings. IMPRESSION: No evidence of pulmonary embolus to the level of the proximal segmental pulmonary arteries. Evaluation of the more distal pulmonary arteries is limited due to respiratory motion and streak artifact. Bilateral lower lobe predominant bronchial wall thickening and clustered nodules of the right lower lobe, likely sequela of aspiration. Electronically Signed   By: Yetta Glassman MD   On: 06/11/2021 11:30   DG Chest Port 1 View  Result Date: 06/11/2021 CLINICAL DATA:  Shortness of breath. EXAM: PORTABLE CHEST 1 VIEW COMPARISON:  04/18/2020 FINDINGS: Heart size is normal. Bilateral lower lobe opacities identified concerning for atelectasis or pneumonia. Pulmonary vascular congestion without overt edema. No pleural effusions identified. IMPRESSION: Bilateral lower lobe opacities concerning for atelectasis or pneumonia. Electronically Signed   By: Kerby Moors M.D.   On: 06/11/2021 08:35    EKG: Independently reviewed -sinus  rhythm without acute ST or T wave changes  Cherene Altes, MD [facility] Office  [phone] Pager - Text Page per Amion as per below:  On-Call/Text  Page:      [email or web address]  If 7PM-7AM, please contact night-coverage [email or web address] 06/11/2021, 3:07 PM

## 2021-06-11 NOTE — ED Notes (Signed)
Patient transported to CT 

## 2021-06-11 NOTE — Progress Notes (Addendum)
ANTICOAGULATION CONSULT NOTE - Initial Consult  Pharmacy Consult for heparin Indication:  ACS/STEMI  Allergies  Allergen Reactions   Actos [Pioglitazone Hydrochloride] Other (See Comments)    Held 2012 bladder cancer   Aspirin Other (See Comments)    Held 2012 due to hematuria and bruising.    Ace Inhibitors Cough   Bactrim Rash and Other (See Comments)    Rash, presumed allergy   Gabapentin Nausea And Vomiting    Upset stomach and diarrhea - tolerates low doses   Lyrica [Pregabalin] Swelling    swelling   Pravastatin Swelling    Swelling of feet   Sulfa Drugs Cross Reactors Rash    Patient Measurements: Height: 6\' 2"  (188 cm) Weight: 95.3 kg (210 lb) IBW/kg (Calculated) : 82.2 Heparin Dosing Weight: 95.3 kg  Vital Signs: Temp: 98.5 F (36.9 C) (08/01 0728) Temp Source: Oral (08/01 0728) BP: 151/70 (08/01 0921) Pulse Rate: 81 (08/01 0921)  Labs: Recent Labs    06/11/21 0732  HGB 15.1  HCT 49.5  PLT 158  CREATININE 1.74*  TROPONINIHS 315*    Estimated Creatinine Clearance: 32.8 mL/min (A) (by C-G formula based on SCr of 1.74 mg/dL (H)).   Medical History: Past Medical History:  Diagnosis Date   (HFpEF) heart failure with preserved ejection fraction (HCC)    Arthritis    Back pain    s/p lumbar injection 2014   BENIGN PROSTATIC HYPERTROPHY 05/21/2007   Bladder cancer ([place]) 10/2011   CAD (coronary artery disease)    nonobstructive by cath 6/12:  mid LAD 30%, proximal obtuse marginal-2 30%, proximal RCA 20%, mid RCA 30-40%.  He had normal cardiac output and mildly elevated filling pressures but no significant pulmonary hypertension;   Echocardiogram in May 2012 demonstrated EF 50-55% and left atrial enlargement    Cellulitis of left foot    Hospitalized in 2006   Chronic kidney disease (CKD), stage III (moderate) (HCC) 12/04/2007   FOLLOWED BY DR PATEL   Chronic pain of lower extremity    Complication of anesthesia    1 time bladder cancer surgery- 2012 ,  oxygen satruratioon dropped had to stay overnight, no problems since then.   COPD (chronic obstructive pulmonary disease) ([place])    DIABETES MELLITUS, TYPE II 05/21/2007   DIVERTICULOSIS, COLON 05/21/2007   pt denies   Dyspnea    with exertion, patient mointors saturation   Fatigue AGE-RELATED   Gross hematuria    pt denies   HYPERTENSION 05/21/2007   Impaired hearing BILATERAL HEARING AIDS   On home oxygen therapy    as needed   PAD (peripheral artery disease) ([place])    a. 02/2016 L foot nonhealing ulcer-->Periph angio: L pop 100 w/ reconstitution via extensive vollats to the prox-mid peroneal (only patent vessel BK)-->PTA of L Pop and peroneal; b. Ongoing LE ischemia 5 & 06/2016 req amputation of toes on L foot.   Pneumonia    Hx  of    Psoriasis ELBOWS   Psoriasis    PSORIASIS, SCALP 10/25/2008    Medications:  Scheduled:   heparin  4,000 Units Intravenous Once    Assessment: 87 year old male presenting with generalized weakness and low O2 saturations into the 80's.  Elevated D-dimer and troponins on presentation.  CBC stable.  No AC PTA. Pharmacy consulted to dose heparin for possible ACS/STEMI.   Goal of Therapy:  Heparin level 0.3-0.7 units/ml Monitor platelets by anticoagulation protocol: Yes   Plan:  Give heparin 4000 units bolus x  1 Start heparin drip at 1150 units/hr F/u 8 hour HL  Daily CBC and HL Monitor for s/sx bleeding F/u cardiac workup  Dimple Nanas, PharmD 06/11/2021,9:48 AM

## 2021-06-11 NOTE — Progress Notes (Signed)
  Echocardiogram 2D Echocardiogram has been performed.  [name] Small 06/11/2021, 2:52 PM

## 2021-06-12 ENCOUNTER — Inpatient Hospital Stay (HOSPITAL_COMMUNITY): Payer: Medicare Other

## 2021-06-12 ENCOUNTER — Encounter: Payer: Self-pay | Admitting: Family Medicine

## 2021-06-12 ENCOUNTER — Other Ambulatory Visit: Payer: Self-pay

## 2021-06-12 DIAGNOSIS — J9602 Acute respiratory failure with hypercapnia: Secondary | ICD-10-CM

## 2021-06-12 DIAGNOSIS — J9601 Acute respiratory failure with hypoxia: Secondary | ICD-10-CM

## 2021-06-12 DIAGNOSIS — R531 Weakness: Secondary | ICD-10-CM | POA: Diagnosis not present

## 2021-06-12 LAB — COMPREHENSIVE METABOLIC PANEL
ALT: 8 U/L (ref 0–44)
AST: 11 U/L — ABNORMAL LOW (ref 15–41)
Albumin: 3.3 g/dL — ABNORMAL LOW (ref 3.5–5.0)
Alkaline Phosphatase: 54 U/L (ref 38–126)
Anion gap: 10 (ref 5–15)
BUN: 38 mg/dL — ABNORMAL HIGH (ref 8–23)
CO2: 39 mmol/L — ABNORMAL HIGH (ref 22–32)
Calcium: 8.7 mg/dL — ABNORMAL LOW (ref 8.9–10.3)
Chloride: 93 mmol/L — ABNORMAL LOW (ref 98–111)
Creatinine, Ser: 1.54 mg/dL — ABNORMAL HIGH (ref 0.61–1.24)
GFR, Estimated: 43 mL/min — ABNORMAL LOW (ref >60)
Glucose, Bld: 50 mg/dL — ABNORMAL LOW (ref 70–99)
Potassium: 4.3 mmol/L (ref 3.5–5.1)
Sodium: 142 mmol/L (ref 135–145)
Total Bilirubin: 1 mg/dL (ref 0.3–1.2)
Total Protein: 6.4 g/dL — ABNORMAL LOW (ref 6.5–8.1)

## 2021-06-12 LAB — RETICULOCYTES
Immature Retic Fract: 13 % (ref 2.3–15.9)
RBC.: 4.45 MIL/uL (ref 4.22–5.81)
Retic Count, Absolute: 92.6 10*3/uL (ref 19.0–186.0)
Retic Ct Pct: 2.1 % (ref 0.4–3.1)

## 2021-06-12 LAB — VITAMIN B12: Vitamin B-12: 388 pg/mL (ref 180–914)

## 2021-06-12 LAB — GLUCOSE, CAPILLARY
Glucose-Capillary: 48 mg/dL — ABNORMAL LOW (ref 70–99)
Glucose-Capillary: 118 mg/dL — ABNORMAL HIGH (ref 70–99)
Glucose-Capillary: 65 mg/dL — ABNORMAL LOW (ref 70–99)
Glucose-Capillary: 166 mg/dL — ABNORMAL HIGH (ref 70–99)
Glucose-Capillary: 150 mg/dL — ABNORMAL HIGH (ref 70–99)
Glucose-Capillary: 128 mg/dL — ABNORMAL HIGH (ref 70–99)
Glucose-Capillary: 153 mg/dL — ABNORMAL HIGH (ref 70–99)
Glucose-Capillary: 110 mg/dL — ABNORMAL HIGH (ref 70–99)

## 2021-06-12 LAB — IRON AND TIBC
Iron: 88 ug/dL (ref 45–182)
Saturation Ratios: 31 % (ref 17.9–39.5)
TIBC: 288 ug/dL (ref 250–450)
UIBC: 200 ug/dL

## 2021-06-12 LAB — CBC
HCT: 47.7 % (ref 39.0–52.0)
Hemoglobin: 14.6 g/dL (ref 13.0–17.0)
MCH: 33 pg (ref 26.0–34.0)
MCHC: 30.6 g/dL (ref 30.0–36.0)
MCV: 107.7 fL — ABNORMAL HIGH (ref 80.0–100.0)
Platelets: 163 10*3/uL (ref 150–400)
RBC: 4.43 MIL/uL (ref 4.22–5.81)
RDW: 13.6 % (ref 11.5–15.5)
WBC: 8 10*3/uL (ref 4.0–10.5)
nRBC: 0 % (ref 0.0–0.2)

## 2021-06-12 LAB — HEMOGLOBIN A1C
Hgb A1c MFr Bld: 8.2 % — ABNORMAL HIGH (ref 4.8–5.6)
Mean Plasma Glucose: 188.64 mg/dL

## 2021-06-12 LAB — FOLATE: Folate: 22.7 ng/mL (ref >5.9)

## 2021-06-12 LAB — HEPARIN LEVEL (UNFRACTIONATED): Heparin Unfractionated: 0.47 IU/mL (ref 0.30–0.70)

## 2021-06-12 LAB — FERRITIN: Ferritin: 37 ng/mL (ref 24–336)

## 2021-06-12 MED ORDER — FUROSEMIDE 10 MG/ML IJ SOLN
40.0000 mg | Freq: Once | INTRAMUSCULAR | Status: AC
  Administered 2021-06-12: 40 mg via INTRAVENOUS
  Filled 2021-06-12: qty 4

## 2021-06-12 MED ORDER — TRAZODONE HCL 50 MG PO TABS
25.0000 mg | ORAL_TABLET | Freq: Every day | ORAL | Status: AC
  Administered 2021-06-12 – 2021-06-13 (×2): 25 mg via ORAL
  Filled 2021-06-12 (×2): qty 1

## 2021-06-12 MED ORDER — HEPARIN SODIUM (PORCINE) 5000 UNIT/ML IJ SOLN
5000.0000 [IU] | Freq: Three times a day (TID) | INTRAMUSCULAR | Status: DC
  Administered 2021-06-12 – 2021-06-16 (×12): 5000 [IU] via SUBCUTANEOUS
  Filled 2021-06-12 (×12): qty 1

## 2021-06-12 MED ORDER — ATORVASTATIN CALCIUM 10 MG PO TABS
10.0000 mg | ORAL_TABLET | Freq: Every day | ORAL | Status: DC
  Administered 2021-06-12 – 2021-06-16 (×5): 10 mg via ORAL
  Filled 2021-06-12 (×5): qty 1

## 2021-06-12 MED ORDER — APREMILAST 30 MG PO TABS
30.0000 mg | ORAL_TABLET | Freq: Every day | ORAL | Status: DC
  Administered 2021-06-12 – 2021-06-16 (×5): 30 mg via ORAL

## 2021-06-12 NOTE — Progress Notes (Addendum)
PROGRESS NOTE    [name] Small  DXI:[id number] DOB: 28-Jul-1928 DOA: 06/11/2021 PCP: Tonia Ghent, MD     Brief Narrative:  [name] Small is a 87yo w/ a hx of nonobstructive CAD (cath 2012), PAD, CKD stage 3, HTN, DM2, and bladder CA who presented to the ED w/ the acute onset of generalized weakness and hypoxia. He requires nocturnal 2L [place] O2 support at baseline, but noted sats in the 80s while awake this morning. He was feeling fatigued in association with this finding. He admitted to not feeling well since at least 05/31/21 with poor intake of solids or liquids, generalized weakness, and decreased UOP (via his usual I/O caths).   In the ER he was noted to have elevated troponins and an elevated d-dimer. CTA revealed no large PE, but did note R > LLL infiltrates suggestive of a possible aspiration. His resp difficutly worsened during his ER stay, requiring use of BiPAP to maintain sats at 90% or >.    New events last 24 hours / Subjective: Patient was weaned off BiPAP this morning, sleeping soundly, a little difficult to arouse but RN stated patient did not sleep well last night.  After my examination, patient was more alert and oriented.  Addendum: Patient reevaluated this afternoon, patient is much more alert, awake, oriented, ate breakfast.  He remains on 6 L nasal cannula O2 and satting 89 to 91%.  He has no physical complaints on examination.  Family report patient having some tremors/shaking episodes since yesterday morning although they have improved today.  These are not associated with intention, and they happen in all extremities while patient is awake, last short duration of seconds/minutes.   Assessment & Plan:   Active Problems:   Hypoxia   Acute hypoxic and hypercarbic respiratory failure  -Suspected combination of aspiration pneumonitis, diastolic heart failure -ABG 7.297 / 78  -Off BiPAP -Continue to wean oxygen to baseline use of 2 L    Possible RLL aspiration  pneumonitis  -Hold off on antibiotics  Acute on chronic diastolic heart failure -BNP 304.5  -Echo EF 55-60%, grade 2 dd  -Cardiology following -Patient received 1 dose IV lasix    NSTEMI -Likely demand ischemia -Cardiology following -IV heparin discontinued   Elevated d-dimer -CTA negative for PE -Venous duplex to rule out DVT Pending   CKD Stage 3b -Baseline Cr ~1.8-2.3  -Stable  DM2 -Ha1c 8.2  -Continue sliding scale insulin  Hyperlipidemia -Continue Lipitor  Psoriasis -Continue apremilast (home medicine)  BPH -Continue proscar, flomax   Chronic cystitis -Doxycycline chronically     DVT prophylaxis: Subq hep    Code Status:     Code Status Orders  (From admission, onward)           Start     Ordered   06/11/21 1630  Do not attempt resuscitation (DNR)  Continuous       Question Answer Comment  In the event of cardiac or respiratory ARREST Do not call a "code blue"   In the event of cardiac or respiratory ARREST Do not perform Intubation, CPR, defibrillation or ACLS   In the event of cardiac or respiratory ARREST Use medication by any route, position, wound care, and other measures to relive pain and suffering. May use oxygen, suction and manual treatment of airway obstruction as needed for comfort.      06/11/21 1631           Code Status History     Date Active  Date Inactive Code Status Order ID Comments User Context   06/11/2021 1615 06/11/2021 1631 DNR [id number]  Cherene Altes, MD ED   07/31/2020 1659 08/09/2020 0151 DNR [id number]  Harold Hedge, MD ED   05/15/2020 1232 05/16/2020 2229 Partial Code [id number]  Knox Royalty, NP Inpatient   05/14/2020 1339 05/15/2020 1232 Full Code [id number]  Donnamae Jude, MD ED   04/26/2020 1648 05/12/2020 1520 Full Code [id number]  Bary Leriche, PA-C Inpatient   04/18/2020 1237 04/26/2020 1644 Full Code [id number]  Little Ishikawa, MD ED   12/26/2016 1408 12/31/2016 1920 Full Code [id number]  Jessy Oto, MD  Inpatient   10/18/2016 1749 10/19/2016 1736 Full Code [id number]  Jessy Oto, MD Inpatient   06/10/2016 1304 06/14/2016 1643 Full Code [id number]  Radene Gunning, NP ED   01/18/2014 0146 01/21/2014 1508 DNR [id number]  Toy Baker, MD Inpatient      Advance Directive Documentation    Flowsheet Row Most Recent Value  Type of Advance Directive Healthcare Power of Attorney  Pre-existing out of facility DNR order (yellow form or pink MOST form) --  "MOST" Form in Place? --      Family Communication: At bedside Disposition Plan:  Status is: Inpatient  Remains inpatient appropriate because:Hemodynamically unstable  Dispo: The patient is from: Home              Anticipated d/c is to: Home              Patient currently is not medically stable to d/c.   Difficult to place patient No      Consultants:  Cardiology   Antimicrobials:  Anti-infectives (From admission, onward)    Start     Dose/Rate Route Frequency Ordered Stop   06/11/21 2000  doxycycline (VIBRA-TABS) tablet 50 mg        50 mg Oral Daily 06/11/21 1634          Objective: Vitals:   06/12/21 0800 06/12/21 0818 06/12/21 0900 06/12/21 1000  BP: (!) 163/60  113/83 140/63  Pulse: 70  69 66  Resp: 18  (!) 21 13  Temp:      TempSrc:      SpO2: 99% 98% 97% 93%  Weight:      Height:        Intake/Output Summary (Last 24 hours) at 06/12/2021 1039 Last data filed at 06/12/2021 0730 Gross per 24 hour  Intake 326.99 ml  Output 4200 ml  Net -3873.01 ml   Filed Weights   06/11/21 0725  Weight: 95.3 kg    Examination:  General exam: Appears calm and comfortable somnolent Respiratory system: Clear to auscultation anteriorly. Respiratory effort normal. No respiratory distress.  On nasal cannula O2 Cardiovascular system: S1 & S2 heard, RRR. No murmurs. + Bilateral ankle edema. Gastrointestinal system: Abdomen is nondistended, soft and nontender. Normal bowel sounds heard. Extremities: Symmetric in appearance   Skin: No rashes, lesions or ulcers on exposed skin   Data Reviewed: I have personally reviewed following labs and imaging studies  CBC: Recent Labs  Lab 06/11/21 0732 06/12/21 0515  WBC 7.2 8.0  HGB 15.1 14.6  HCT 49.5 47.7  MCV 108.3* 107.7*  PLT 158 211   Basic Metabolic Panel: Recent Labs  Lab 06/11/21 0732 06/12/21 0515  NA 142 142  K 4.9 4.3  CL 98 93*  CO2 37* 39*  GLUCOSE 161* 50*  BUN 41* 38*  CREATININE 1.74* 1.54*  CALCIUM 9.1 8.7*  GFR: Estimated Creatinine Clearance: 37.1 mL/min (A) (by C-G formula based on SCr of 1.54 mg/dL (H)). Liver Function Tests: Recent Labs  Lab 06/11/21 0732 06/12/21 0515  AST 12* 11*  ALT 9 8  ALKPHOS 52 54  BILITOT 0.7 1.0  PROT 6.7 6.4*  ALBUMIN 3.6 3.3*   No results for input(s): LIPASE, AMYLASE in the last 168 hours. No results for input(s): AMMONIA in the last 168 hours. Coagulation Profile: No results for input(s): INR, PROTIME in the last 168 hours. Cardiac Enzymes: No results for input(s): CKTOTAL, CKMB, CKMBINDEX, TROPONINI in the last 168 hours. BNP (last 3 results) No results for input(s): PROBNP in the last 8760 hours. HbA1C: Recent Labs    06/12/21 0515  HGBA1C 8.2*   CBG: Recent Labs  Lab 06/11/21 2115 06/12/21 0623 06/12/21 0637 06/12/21 0652 06/12/21 0730  GLUCAP 75 48* 65* 118* 166*   Lipid Profile: No results for input(s): CHOL, HDL, LDLCALC, TRIG, CHOLHDL, LDLDIRECT in the last 72 hours. Thyroid Function Tests: No results for input(s): TSH, T4TOTAL, FREET4, T3FREE, THYROIDAB in the last 72 hours. Anemia Panel: Recent Labs    06/12/21 0515  VITAMINB12 388  FOLATE 22.7  FERRITIN 37  TIBC 288  IRON 88  RETICCTPCT 2.1   Sepsis Labs: Recent Labs  Lab 06/11/21 1510  LATICACIDVEN 0.8    Recent Results (from the past 240 hour(s))  Resp Panel by RT-PCR (Flu A&B, Covid) Nasopharyngeal Swab     Status: None   Collection Time: 06/11/21  8:48 AM   Specimen: Nasopharyngeal Swab;  Nasopharyngeal(NP) swabs in vial transport medium  Result Value Ref Range Status   SARS Coronavirus 2 by RT PCR NEGATIVE NEGATIVE Final    Comment: (NOTE) SARS-CoV-2 target nucleic acids are NOT DETECTED.  The SARS-CoV-2 RNA is generally detectable in upper respiratory specimens during the acute phase of infection. The lowest concentration of SARS-CoV-2 viral copies this assay can detect is 138 copies/mL. A negative result does not preclude SARS-Cov-2 infection and should not be used as the sole basis for treatment or other patient management decisions. A negative result may occur with  improper specimen collection/handling, submission of specimen other than nasopharyngeal swab, presence of viral mutation(s) within the areas targeted by this assay, and inadequate number of viral copies(<138 copies/mL). A negative result must be combined with clinical observations, patient history, and epidemiological information. The expected result is Negative.  Fact Sheet for Patients:  EntrepreneurPulse.com.au  Fact Sheet for Healthcare Providers:  IncredibleEmployment.be  This test is no t yet approved or cleared by the [place] FDA and  has been authorized for detection and/or diagnosis of SARS-CoV-2 by FDA under an Emergency Use Authorization (EUA). This EUA will remain  in effect (meaning this test can be used) for the duration of the COVID-19 declaration under Section 564(b)(1) of the Act, 21 U.S.C.section 360bbb-3(b)(1), unless the authorization is terminated  or revoked sooner.       Influenza A by PCR NEGATIVE NEGATIVE Final   Influenza B by PCR NEGATIVE NEGATIVE Final    Comment: (NOTE) The Xpert Xpress SARS-CoV-2/FLU/RSV plus assay is intended as an aid in the diagnosis of influenza from Nasopharyngeal swab specimens and should not be used as a sole basis for treatment. Nasal washings and aspirates are unacceptable for Xpert Xpress  SARS-CoV-2/FLU/RSV testing.  Fact Sheet for Patients: EntrepreneurPulse.com.au  Fact Sheet for Healthcare Providers: IncredibleEmployment.be  This test is not yet approved or cleared by the [place] and has been authorized  for detection and/or diagnosis of SARS-CoV-2 by FDA under an Emergency Use Authorization (EUA). This EUA will remain in effect (meaning this test can be used) for the duration of the COVID-19 declaration under Section 564(b)(1) of the Act, 21 U.S.C. section 360bbb-3(b)(1), unless the authorization is terminated or revoked.  Performed at [facility], [place] [address], [place], [place] [address]       Radiology Studies: CT Head Wo Contrast  Result Date: 06/11/2021 CLINICAL DATA:  Mental status changes. EXAM: CT HEAD WITHOUT CONTRAST TECHNIQUE: Contiguous axial images were obtained from the base of the skull through the vertex without intravenous contrast. COMPARISON:  04/24/2020 FINDINGS: Brain: There is atrophy and chronic small vessel disease changes. No acute intracranial abnormality. Specifically, no hemorrhage, hydrocephalus, mass lesion, acute infarction, or significant intracranial injury. Vascular: No hyperdense vessel or unexpected calcification. Skull: No acute calvarial abnormality. Sinuses/Orbits: No acute findings Other: None IMPRESSION: Atrophy, chronic microvascular disease. No acute intracranial abnormality. Electronically Signed   By: Rolm Baptise M.D.   On: 06/11/2021 08:11   CT Angio Chest PE W and/or Wo Contrast  Result Date: 06/11/2021 CLINICAL DATA:  PE suspected EXAM: CT ANGIOGRAPHY CHEST WITH CONTRAST TECHNIQUE: Multidetector CT imaging of the chest was performed using the standard protocol during bolus administration of intravenous contrast. Multiplanar CT image reconstructions and MIPs were obtained to evaluate the vascular anatomy. CONTRAST:  63mL OMNIPAQUE IOHEXOL 350 MG/ML SOLN  COMPARISON:  CT chest dated Apr 06, 2005 FINDINGS: Cardiovascular: Normal heart size. No pericardial effusion. Three-vessel coronary artery calcifications. Atherosclerotic disease of the thoracic aorta. No evidence of pulmonary embolus to the level of the proximal segmental pulmonary arteries. Adequate contrast opacification of the pulmonary arteries. Evaluation of the more distal pulmonary arteries is limited due to respiratory motion and streak artifact. Mediastinum/Nodes: No enlarged mediastinal, hilar, or axillary lymph nodes. Thyroid gland, trachea, and esophagus demonstrate no significant findings. Lungs/Pleura: Small right-greater-than-left pleural effusions with associated atelectasis centrilobular emphysema. Mild bronchial wall thickening, most pronounced in the lower lungs. Clustered nodules of the right lower lobe seen on series 3 image 259. Linear opacities in the right lower lobe, likely due to scarring or atelectasis. No consolidation, pleural effusion or pneumothorax. Central airways are patent. Upper Abdomen: No acute abnormality. Musculoskeletal: No chest wall abnormality. No acute or significant osseous findings. Review of the MIP images confirms the above findings. IMPRESSION: No evidence of pulmonary embolus to the level of the proximal segmental pulmonary arteries. Evaluation of the more distal pulmonary arteries is limited due to respiratory motion and streak artifact. Bilateral lower lobe predominant bronchial wall thickening and clustered nodules of the right lower lobe, likely sequela of aspiration. Electronically Signed   By: Yetta Glassman MD   On: 06/11/2021 11:30   DG Chest Port 1 View  Result Date: 06/11/2021 CLINICAL DATA:  Shortness of breath. EXAM: PORTABLE CHEST 1 VIEW COMPARISON:  04/18/2020 FINDINGS: Heart size is normal. Bilateral lower lobe opacities identified concerning for atelectasis or pneumonia. Pulmonary vascular congestion without overt edema. No pleural effusions  identified. IMPRESSION: Bilateral lower lobe opacities concerning for atelectasis or pneumonia. Electronically Signed   By: Kerby Moors M.D.   On: 06/11/2021 08:35   ECHOCARDIOGRAM COMPLETE  Result Date: 06/11/2021    ECHOCARDIOGRAM REPORT   Patient Name:   [name] Small Date of Exam: 06/11/2021 Medical Rec #:  [id number]       Height:       74.0 in Accession #:    [id number]  Weight:       210.0 lb Date of Birth:  1928-01-26      BSA:          2.219 m Patient Age:    28 years        BP:           147/101 mmHg Patient Gender: M               HR:           96 bpm. Exam Location:  Inpatient Procedure: 2D Echo, Cardiac Doppler and Color Doppler Indications:    I25-125.9 Ischemic heart disease  History:        Patient has prior history of Echocardiogram examinations, most                 recent 01/18/2014. CHF, Abnormal ECG, COPD, Arrythmias:Atrial                 Fibrillation, Signs/Symptoms:Shortness of Breath and Dyspnea;                 Risk Factors:Diabetes and Hypertension. Hypoxia.  Sonographer:    Roseanna Rainbow RDCS Referring Phys: [id number] ANNA Ree Kida  Sonographer Comments: Technically difficult study due to poor echo windows and suboptimal parasternal window. Image acquisition challenging due to COPD. Patient supine. Patient moving throughout exam. IMPRESSIONS  1. Left ventricular ejection fraction, by estimation, is 55 to 60%. The left ventricle has normal function. The left ventricle has no regional wall motion abnormalities. There is mild left ventricular hypertrophy. Left ventricular diastolic parameters are consistent with Grade II diastolic dysfunction (pseudonormalization). Elevated left atrial pressure.  2. Right ventricular systolic function is normal. The right ventricular size is mildly enlarged. There is moderately elevated pulmonary artery systolic pressure.  3. The mitral valve is normal in structure. No evidence of mitral valve regurgitation. No evidence of mitral stenosis.  4. The aortic  valve is tricuspid. Aortic valve regurgitation is not visualized. Mild to moderate aortic valve sclerosis/calcification is present, without any evidence of aortic stenosis. FINDINGS  Left Ventricle: Left ventricular ejection fraction, by estimation, is 55 to 60%. The left ventricle has normal function. The left ventricle has no regional wall motion abnormalities. The left ventricular internal cavity size was normal in size. There is  mild left ventricular hypertrophy. Left ventricular diastolic parameters are consistent with Grade II diastolic dysfunction (pseudonormalization). Elevated left atrial pressure. Right Ventricle: The right ventricular size is mildly enlarged. Right vetricular wall thickness was not well visualized. Right ventricular systolic function is normal. There is moderately elevated pulmonary artery systolic pressure. The tricuspid regurgitant velocity is 3.27 m/s, and with an assumed right atrial pressure of 8 mmHg, the estimated right ventricular systolic pressure is 29.5 mmHg. Left Atrium: Left atrial size was normal in size. Right Atrium: Right atrial size was normal in size. Pericardium: There is no evidence of pericardial effusion. Mitral Valve: The mitral valve is normal in structure. No evidence of mitral valve regurgitation. No evidence of mitral valve stenosis. Tricuspid Valve: The tricuspid valve is normal in structure. Tricuspid valve regurgitation is mild. Aortic Valve: The aortic valve is tricuspid. Aortic valve regurgitation is not visualized. Mild to moderate aortic valve sclerosis/calcification is present, without any evidence of aortic stenosis. Pulmonic Valve: The pulmonic valve was not well visualized. Pulmonic valve regurgitation is not visualized. Aorta: The aortic root and ascending aorta are structurally normal, with no evidence of dilitation. IAS/Shunts: The interatrial septum was not well visualized.  LEFT  VENTRICLE PLAX 2D LVIDd:         4.50 cm      Diastology LVIDs:          3.40 cm      LV e' medial:    6.53 cm/s LV PW:         1.10 cm      LV E/e' medial:  11.3 LV IVS:        1.15 cm      LV e' lateral:   3.59 cm/s LVOT diam:     2.40 cm      LV E/e' lateral: 20.6 LV SV:         109 LV SV Index:   49 LVOT Area:     4.52 cm  LV Volumes (MOD) LV vol d, MOD A2C: 85.4 ml LV vol d, MOD A4C: 120.0 ml LV vol s, MOD A2C: 31.7 ml LV vol s, MOD A4C: 53.8 ml LV SV MOD A2C:     53.7 ml LV SV MOD A4C:     120.0 ml LV SV MOD BP:      64.7 ml RIGHT VENTRICLE RV S prime:     11.00 cm/s TAPSE (M-mode): 2.3 cm LEFT ATRIUM             Index       RIGHT ATRIUM           Index LA diam:        3.70 cm 1.67 cm/m  RA Area:     18.20 cm LA Vol (A2C):   38.2 ml 17.21 ml/m RA Volume:   52.40 ml  23.61 ml/m LA Vol (A4C):   34.7 ml 15.64 ml/m LA Biplane Vol: 36.3 ml 16.36 ml/m  AORTIC VALVE LVOT Vmax:   101.00 cm/s LVOT Vmean:  68.700 cm/s LVOT VTI:    0.242 m  AORTA Ao Root diam: 3.30 cm Ao Asc diam:  3.60 cm MITRAL VALVE                TRICUSPID VALVE MV Area (PHT): 3.77 cm     TR Peak grad:   42.8 mmHg MV Decel Time: 201 msec     TR Vmax:        327.00 cm/s MV E velocity: 74.10 cm/s MV A velocity: 107.00 cm/s  SHUNTS MV E/A ratio:  0.69         Systemic VTI:  0.24 m                             Systemic Diam: 2.40 cm Oswaldo Milian MD Electronically signed by Oswaldo Milian MD Signature Date/Time: 06/11/2021/4:39:51 PM    Final       Scheduled Meds:  Apremilast  30 mg Oral Daily   chlorhexidine  15 mL Mouth Rinse BID   Chlorhexidine Gluconate Cloth  6 each Topical Q0600   docusate sodium  100 mg Oral BID   doxycycline  50 mg Oral Daily   finasteride  5 mg Oral Daily   insulin aspart  0-5 Units Subcutaneous QHS   insulin aspart  0-9 Units Subcutaneous TID WC   mouth rinse  15 mL Mouth Rinse q12n4p   pantoprazole  80 mg Oral Daily   tamsulosin  0.8 mg Oral Daily   umeclidinium bromide  1 puff Inhalation Daily   Continuous Infusions:  dextrose 5 % and 0.9% NaCl        LOS:  1 day      Time spent: 30 minutes   Dessa Phi, DO [facility] 06/12/2021, 10:39 AM   Available via Epic secure chat 7am-7pm After these hours, please refer to coverage provider listed on [email or web address]

## 2021-06-12 NOTE — Progress Notes (Addendum)
Progress Note  Patient Name: [name] Small Date of Encounter: 06/12/2021  [facility] Cardiologist: [name], MD   Subjective   No chest pain and no SOB  Inpatient Medications    Scheduled Meds:  Apremilast  30 mg Oral Daily   atorvastatin  10 mg Oral Daily   chlorhexidine  15 mL Mouth Rinse BID   Chlorhexidine Gluconate Cloth  6 each Topical Q0600   docusate sodium  100 mg Oral BID   doxycycline  50 mg Oral Daily   finasteride  5 mg Oral Daily   heparin  5,000 Units Subcutaneous Q8H   insulin aspart  0-5 Units Subcutaneous QHS   insulin aspart  0-9 Units Subcutaneous TID WC   mouth rinse  15 mL Mouth Rinse q12n4p   pantoprazole  80 mg Oral Daily   tamsulosin  0.8 mg Oral Daily   umeclidinium bromide  1 puff Inhalation Daily   Continuous Infusions:  PRN Meds: acetaminophen **OR** acetaminophen, ondansetron **OR** ondansetron (ZOFRAN) IV, traMADol   Vital Signs    Vitals:   06/12/21 0800 06/12/21 0818 06/12/21 0900 06/12/21 1000  BP: (!) 163/60  113/83 140/63  Pulse: 70  69 66  Resp: 18  (!) 21 13  Temp:      TempSrc:      SpO2: 99% 98% 97% 93%  Weight:      Height:        Intake/Output Summary (Last 24 hours) at 06/12/2021 1057 Last data filed at 06/12/2021 0730 Gross per 24 hour  Intake 326.99 ml  Output 4200 ml  Net -3873.01 ml   Last 3 Weights 06/11/2021 11/07/2020 09/04/2020  Weight (lbs) 210 lb (No Data) 205 lb  Weight (kg) 95.255 kg (No Data) 92.987 kg      Telemetry    SR - Personally Reviewed  ECG    No new - Personally Reviewed  Physical Exam   GEN: No acute distress.   Neck: mild JVD Cardiac: RRR, no murmurs, rubs, or gallops.  Respiratory: diminished throughout to auscultation bilaterally. GI: Soft, nontender, non-distended  MS: No edema; No deformity. Neuro:  Nonfocal  Psych: Normal affect   Labs    High Sensitivity Troponin:   Recent Labs  Lab 06/11/21 0732 06/11/21 0958 06/11/21 1331 06/11/21 2203   TROPONINIHS 315* 283* 243* 184*      Chemistry Recent Labs  Lab 06/11/21 0732 06/12/21 0515  NA 142 142  K 4.9 4.3  CL 98 93*  CO2 37* 39*  GLUCOSE 161* 50*  BUN 41* 38*  CREATININE 1.74* 1.54*  CALCIUM 9.1 8.7*  PROT 6.7 6.4*  ALBUMIN 3.6 3.3*  AST 12* 11*  ALT 9 8  ALKPHOS 52 54  BILITOT 0.7 1.0  GFRNONAA 37* 43*  ANIONGAP 7 10     Hematology Recent Labs  Lab 06/11/21 0732 06/12/21 0515  WBC 7.2 8.0  RBC 4.57 4.43  4.45  HGB 15.1 14.6  HCT 49.5 47.7  MCV 108.3* 107.7*  MCH 33.0 33.0  MCHC 30.5 30.6  RDW 13.4 13.6  PLT 158 163    BNP Recent Labs  Lab 06/11/21 0732  BNP 304.5*     DDimer  Recent Labs  Lab 06/11/21 0732  DDIMER 1.26*     Radiology    CT Head Wo Contrast  Result Date: 06/11/2021 CLINICAL DATA:  Mental status changes. EXAM: CT HEAD WITHOUT CONTRAST TECHNIQUE: Contiguous axial images were obtained from the base of the skull through the vertex  without intravenous contrast. COMPARISON:  04/24/2020 FINDINGS: Brain: There is atrophy and chronic small vessel disease changes. No acute intracranial abnormality. Specifically, no hemorrhage, hydrocephalus, mass lesion, acute infarction, or significant intracranial injury. Vascular: No hyperdense vessel or unexpected calcification. Skull: No acute calvarial abnormality. Sinuses/Orbits: No acute findings Other: None IMPRESSION: Atrophy, chronic microvascular disease. No acute intracranial abnormality. Electronically Signed   By: Rolm Baptise M.D.   On: 06/11/2021 08:11   CT Angio Chest PE W and/or Wo Contrast  Result Date: 06/11/2021 CLINICAL DATA:  PE suspected EXAM: CT ANGIOGRAPHY CHEST WITH CONTRAST TECHNIQUE: Multidetector CT imaging of the chest was performed using the standard protocol during bolus administration of intravenous contrast. Multiplanar CT image reconstructions and MIPs were obtained to evaluate the vascular anatomy. CONTRAST:  57mL OMNIPAQUE IOHEXOL 350 MG/ML SOLN COMPARISON:  CT  chest dated Apr 06, 2005 FINDINGS: Cardiovascular: Normal heart size. No pericardial effusion. Three-vessel coronary artery calcifications. Atherosclerotic disease of the thoracic aorta. No evidence of pulmonary embolus to the level of the proximal segmental pulmonary arteries. Adequate contrast opacification of the pulmonary arteries. Evaluation of the more distal pulmonary arteries is limited due to respiratory motion and streak artifact. Mediastinum/Nodes: No enlarged mediastinal, hilar, or axillary lymph nodes. Thyroid gland, trachea, and esophagus demonstrate no significant findings. Lungs/Pleura: Small right-greater-than-left pleural effusions with associated atelectasis centrilobular emphysema. Mild bronchial wall thickening, most pronounced in the lower lungs. Clustered nodules of the right lower lobe seen on series 3 image 259. Linear opacities in the right lower lobe, likely due to scarring or atelectasis. No consolidation, pleural effusion or pneumothorax. Central airways are patent. Upper Abdomen: No acute abnormality. Musculoskeletal: No chest wall abnormality. No acute or significant osseous findings. Review of the MIP images confirms the above findings. IMPRESSION: No evidence of pulmonary embolus to the level of the proximal segmental pulmonary arteries. Evaluation of the more distal pulmonary arteries is limited due to respiratory motion and streak artifact. Bilateral lower lobe predominant bronchial wall thickening and clustered nodules of the right lower lobe, likely sequela of aspiration. Electronically Signed   By: Yetta Glassman MD   On: 06/11/2021 11:30   DG Chest Port 1 View  Result Date: 06/11/2021 CLINICAL DATA:  Shortness of breath. EXAM: PORTABLE CHEST 1 VIEW COMPARISON:  04/18/2020 FINDINGS: Heart size is normal. Bilateral lower lobe opacities identified concerning for atelectasis or pneumonia. Pulmonary vascular congestion without overt edema. No pleural effusions identified.  IMPRESSION: Bilateral lower lobe opacities concerning for atelectasis or pneumonia. Electronically Signed   By: Kerby Moors M.D.   On: 06/11/2021 08:35   ECHOCARDIOGRAM COMPLETE  Result Date: 06/11/2021    ECHOCARDIOGRAM REPORT   Patient Name:   [name] Small Date of Exam: 06/11/2021 Medical Rec #:  [id number]       Height:       74.0 in Accession #:    [id number]      Weight:       210.0 lb Date of Birth:  05-03-1928      BSA:          2.219 m Patient Age:    20 years        BP:           147/101 mmHg Patient Gender: M               HR:           96 bpm. Exam Location:  Inpatient Procedure: 2D Echo, Cardiac Doppler and Color Doppler Indications:  I25-125.9 Ischemic heart disease  History:        Patient has prior history of Echocardiogram examinations, most                 recent 01/18/2014. CHF, Abnormal ECG, COPD, Arrythmias:Atrial                 Fibrillation, Signs/Symptoms:Shortness of Breath and Dyspnea;                 Risk Factors:Diabetes and Hypertension. Hypoxia.  Sonographer:    Roseanna Rainbow RDCS Referring Phys: [id number] ANNA Ree Kida  Sonographer Comments: Technically difficult study due to poor echo windows and suboptimal parasternal window. Image acquisition challenging due to COPD. Patient supine. Patient moving throughout exam. IMPRESSIONS  1. Left ventricular ejection fraction, by estimation, is 55 to 60%. The left ventricle has normal function. The left ventricle has no regional wall motion abnormalities. There is mild left ventricular hypertrophy. Left ventricular diastolic parameters are consistent with Grade II diastolic dysfunction (pseudonormalization). Elevated left atrial pressure.  2. Right ventricular systolic function is normal. The right ventricular size is mildly enlarged. There is moderately elevated pulmonary artery systolic pressure.  3. The mitral valve is normal in structure. No evidence of mitral valve regurgitation. No evidence of mitral stenosis.  4. The aortic valve is  tricuspid. Aortic valve regurgitation is not visualized. Mild to moderate aortic valve sclerosis/calcification is present, without any evidence of aortic stenosis. FINDINGS  Left Ventricle: Left ventricular ejection fraction, by estimation, is 55 to 60%. The left ventricle has normal function. The left ventricle has no regional wall motion abnormalities. The left ventricular internal cavity size was normal in size. There is  mild left ventricular hypertrophy. Left ventricular diastolic parameters are consistent with Grade II diastolic dysfunction (pseudonormalization). Elevated left atrial pressure. Right Ventricle: The right ventricular size is mildly enlarged. Right vetricular wall thickness was not well visualized. Right ventricular systolic function is normal. There is moderately elevated pulmonary artery systolic pressure. The tricuspid regurgitant velocity is 3.27 m/s, and with an assumed right atrial pressure of 8 mmHg, the estimated right ventricular systolic pressure is 93.8 mmHg. Left Atrium: Left atrial size was normal in size. Right Atrium: Right atrial size was normal in size. Pericardium: There is no evidence of pericardial effusion. Mitral Valve: The mitral valve is normal in structure. No evidence of mitral valve regurgitation. No evidence of mitral valve stenosis. Tricuspid Valve: The tricuspid valve is normal in structure. Tricuspid valve regurgitation is mild. Aortic Valve: The aortic valve is tricuspid. Aortic valve regurgitation is not visualized. Mild to moderate aortic valve sclerosis/calcification is present, without any evidence of aortic stenosis. Pulmonic Valve: The pulmonic valve was not well visualized. Pulmonic valve regurgitation is not visualized. Aorta: The aortic root and ascending aorta are structurally normal, with no evidence of dilitation. IAS/Shunts: The interatrial septum was not well visualized.  LEFT VENTRICLE PLAX 2D LVIDd:         4.50 cm      Diastology LVIDs:          3.40 cm      LV e' medial:    6.53 cm/s LV PW:         1.10 cm      LV E/e' medial:  11.3 LV IVS:        1.15 cm      LV e' lateral:   3.59 cm/s LVOT diam:     2.40 cm      LV  E/e' lateral: 20.6 LV SV:         109 LV SV Index:   49 LVOT Area:     4.52 cm  LV Volumes (MOD) LV vol d, MOD A2C: 85.4 ml LV vol d, MOD A4C: 120.0 ml LV vol s, MOD A2C: 31.7 ml LV vol s, MOD A4C: 53.8 ml LV SV MOD A2C:     53.7 ml LV SV MOD A4C:     120.0 ml LV SV MOD BP:      64.7 ml RIGHT VENTRICLE RV S prime:     11.00 cm/s TAPSE (M-mode): 2.3 cm LEFT ATRIUM             Index       RIGHT ATRIUM           Index LA diam:        3.70 cm 1.67 cm/m  RA Area:     18.20 cm LA Vol (A2C):   38.2 ml 17.21 ml/m RA Volume:   52.40 ml  23.61 ml/m LA Vol (A4C):   34.7 ml 15.64 ml/m LA Biplane Vol: 36.3 ml 16.36 ml/m  AORTIC VALVE LVOT Vmax:   101.00 cm/s LVOT Vmean:  68.700 cm/s LVOT VTI:    0.242 m  AORTA Ao Root diam: 3.30 cm Ao Asc diam:  3.60 cm MITRAL VALVE                TRICUSPID VALVE MV Area (PHT): 3.77 cm     TR Peak grad:   42.8 mmHg MV Decel Time: 201 msec     TR Vmax:        327.00 cm/s MV E velocity: 74.10 cm/s MV A velocity: 107.00 cm/s  SHUNTS MV E/A ratio:  0.69         Systemic VTI:  0.24 m                             Systemic Diam: 2.40 cm Oswaldo Milian MD Electronically signed by Oswaldo Milian MD Signature Date/Time: 06/11/2021/4:39:51 PM    Final     Cardiac Studies  Echo 06/11/21 IMPRESSIONS     1. Left ventricular ejection fraction, by estimation, is 55 to 60%. The  left ventricle has normal function. The left ventricle has no regional  wall motion abnormalities. There is mild left ventricular hypertrophy.  Left ventricular diastolic parameters  are consistent with Grade II diastolic dysfunction (pseudonormalization).  Elevated left atrial pressure.   2. Right ventricular systolic function is normal. The right ventricular  size is mildly enlarged. There is moderately elevated pulmonary artery   systolic pressure.   3. The mitral valve is normal in structure. No evidence of mitral valve  regurgitation. No evidence of mitral stenosis.   4. The aortic valve is tricuspid. Aortic valve regurgitation is not  visualized. Mild to moderate aortic valve sclerosis/calcification is  present, without any evidence of aortic stenosis.   FINDINGS   Left Ventricle: Left ventricular ejection fraction, by estimation, is 55  to 60%. The left ventricle has normal function. The left ventricle has no  regional wall motion abnormalities. The left ventricular internal cavity  size was normal in size. There is   mild left ventricular hypertrophy. Left ventricular diastolic parameters  are consistent with Grade II diastolic dysfunction (pseudonormalization).  Elevated left atrial pressure.   Right Ventricle: The right ventricular size is mildly enlarged. Right  vetricular wall thickness was not well  visualized. Right ventricular  systolic function is normal. There is moderately elevated pulmonary artery  systolic pressure. The tricuspid  regurgitant velocity is 3.27 m/s, and with an assumed right atrial  pressure of 8 mmHg, the estimated right ventricular systolic pressure is  02.5 mmHg.   Left Atrium: Left atrial size was normal in size.   Right Atrium: Right atrial size was normal in size.   Pericardium: There is no evidence of pericardial effusion.   Mitral Valve: The mitral valve is normal in structure. No evidence of  mitral valve regurgitation. No evidence of mitral valve stenosis.   Tricuspid Valve: The tricuspid valve is normal in structure. Tricuspid  valve regurgitation is mild.   Aortic Valve: The aortic valve is tricuspid. Aortic valve regurgitation is  not visualized. Mild to moderate aortic valve sclerosis/calcification is  present, without any evidence of aortic stenosis.   Pulmonic Valve: The pulmonic valve was not well visualized. Pulmonic valve  regurgitation is not  visualized.   Aorta: The aortic root and ascending aorta are structurally normal, with  no evidence of dilitation.   IAS/Shunts: The interatrial septum was not well visualized.      LEFT VENTRICLE  PLAX 2D  LVIDd:         4.50 cm      Diastology  LVIDs:         3.40 cm      LV e' medial:    6.53 cm/s  LV PW:         1.10 cm      LV E/e' medial:  11.3  LV IVS:        1.15 cm      LV e' lateral:   3.59 cm/s  LVOT diam:     2.40 cm      LV E/e' lateral: 20.6  LV SV:         109  LV SV Index:   49  LVOT Area:     4.52 cm       Patient Profile     87 y.o. male  with a hx of  PAD, non obstructive CAD, CKD-4, HTN DM-2, bladder cancer now admitted with respiratory failure and diastolic HF, mild troponin elevation  Assessment & Plan   Acute respiratory failure may be due to aspiration PNA-    No acute HF on CXR though BNP is up slightly. He does have emphysema.  Pt has some foot edema but no fluid or JVD, may be related to his COPD- 2015 EF was 50-55% - after our visit pt again developed acute episode requiring bipap.  Now weaned off.  Pt rec'd 1 dose of IV lasix and he is neg [id number] since admit, no weights. May need another dose of lasix today will defer to MD Elevated troponin to 300 continues to decrease, no wall motion abnormalities on Echo and no acute EKG changes. Could be demand ischemia from respiratory failure.  He is on IV heparin now.  Neg PE.  troponin but if flat would stop heparin. stop IV heparin, after MD sees Non obstructive CAD on cath 2012 - on CTA of chest 3 vessel coronary artery calcifications.   HTN on no meds HLD on lipitor DM-2 on inulin per IM PAD with PTA of lt popliteal and peroneal arteries then amputation of toes on left foot. Had ulcer, no resolved.        For questions or updates, please contact [place] Please consult [email or web address] for contact info  under        Signed, Cecilie Kicks, NP  06/12/2021, 10:57 AM    Patient seen and examined with  Cecilie Kicks NP.  Agree as above, with the following exceptions and changes as noted below. Doing better today - diuresed well after one dose of lasix. Daughter in room today and notes he does cough and choke sometimes while eating - we discussed swallow eval. Patient's nurses will involve speech therapy to assist. Suspect he may have had aspiration event or decompensation of COPD which lead to demand ischemia and mild diastolic HF. Gen: NAD, CV: RRR, no murmurs, Lungs: diminished in bases, Abd: soft, Extrem: Warm, well perfused, no edema, Neuro/Psych: alert and oriented x 3, normal mood and affect. All available labs, radiology testing, previous records reviewed. Will redose lasix today 40 mg IV and will assess again tomorrow. Agree with speech eval for swallow.   Elouise Munroe, MD 06/12/21 2:36 PM

## 2021-06-12 NOTE — TOC Initial Note (Signed)
Transition of Care [facility]) - Initial/Assessment Note    Patient Details  Name: [name] Small MRN: [id number] Date of Birth: 1928-06-29  Transition of Care [facility]) CM/SW Contact:    Leeroy Cha, RN Phone Number: 06/12/2021, 7:48 AM  Clinical Narrative:                 87yo w/ a hx of nonobstructive CAD (cath 2012), PAD, CKD stage 3, HTN, DM2, and bladder CA who presented to the ED w/ the acute onset of generalized weakness and hypoxia. He requires nocturnal 2L [place] O2 support at baseline, but noted sats in the 80s while awake this morning. He was feeling fatigued in association with this finding. He admitted to not feeling well since at least 05/31/21 with poor intake of solids or liquids, generalized weakness, and decreased UOP (via his usual I/O caths).   In the ER he was noted to have elevated troponins and an elevated d-dimer. CTa revealed no large PE, but did not R > L LL infiltrates suggestive of a possible aspiration. His resp difficutly worsened during his ER stay, requiring use of BiPAP to maintain sats at 90% or >.     Assessment/Plan   Acute hypoxic and hypercarbic respiratory failure  Exact etiology unclear at this time -pneumonitis high on the differential likely related to transient episode of aspiration -is tolerating BiPAP very well at present -admit to stepdown bed but attempt to wean off BiPAP rapidly   Possible RLL aspiration pneumonitis  No clear evidence of active infection at present -hold off on antibiotics -monitor for evidence of dysphagia once off BiPAP and cleared for diet   NSTEMI Likely stress-induced in setting of respiratory distress -cardiology following -anticipate heparin to be discontinued and short course   Elevated d-dimer CTa chest negative for PE -check venous duplex to rule out DVTs   CKD Stage 3 Baseline crt ~1.8-2.3 -renal function appears stable for now -monitor trend   DM2 Utilize SSI and follow CBGs closely   PAD No acute complications  presently   Macrocytosis Given advanced age is at risk for pernicious anemia -check Z32 and folic acid levels  TOC PLAN OF CARE:  on bipap will follow for toc needs-possible home o2 Following for progression pt is a DNR.  Lives at home with husband.  Expected Discharge Plan: Home/Self Care Barriers to Discharge: Continued Medical Work up   Patient Goals and CMS Choice Patient states their goals for this hospitalization and ongoing recovery are:: to go home CMS Medicare.gov Compare Post Acute Care list provided to:: Patient    Expected Discharge Plan and Services Expected Discharge Plan: Home/Self Care   Discharge Planning Services: CM Consult   Living arrangements for the past 2 months: Single Family Home                                      Prior Living Arrangements/Services Living arrangements for the past 2 months: Single Family Home Lives with:: Spouse Patient language and need for interpreter reviewed:: Yes Do you feel safe going back to the place where you live?: Yes            Criminal Activity/Legal Involvement Pertinent to Current Situation/Hospitalization: No - Comment as needed  Activities of Daily Living Home Assistive Devices/Equipment: Built-in shower seat, Dentures (specify type), Hearing aid, Walker (specify type), Other (Comment) (front wheeled walker, bilateral hearing aids, walk-in shower) ADL  Screening (condition at time of admission) Patient's cognitive ability adequate to safely complete daily activities?: No Is the patient deaf or have difficulty hearing?: Yes (wears bilateral hearing aids) Does the patient have difficulty seeing, even when wearing glasses/contacts?: No Does the patient have difficulty concentrating, remembering, or making decisions?: Yes Patient able to express need for assistance with ADLs?: Yes Does the patient have difficulty dressing or bathing?: Yes Independently performs ADLs?: No Communication: Independent Dressing  (OT): Needs assistance Is this a change from baseline?: Pre-admission baseline Grooming: Needs assistance Is this a change from baseline?: Pre-admission baseline Feeding: Needs assistance Is this a change from baseline?: Pre-admission baseline Bathing: Needs assistance Is this a change from baseline?: Pre-admission baseline Toileting: Dependent Is this a change from baseline?: Change from baseline, expected to last >3days In/Out Bed: Dependent Is this a change from baseline?: Change from baseline, expected to last >3 days Walks in Home: Dependent (patient ususally can stand and pivot but not able to do anything today) Is this a change from baseline?: Change from baseline, expected to last >3 days Does the patient have difficulty walking or climbing stairs?: Yes (secondary to weakness) Weakness of Legs: Both Weakness of Arms/Hands: None  Permission Sought/Granted                  Emotional Assessment Appearance:: Appears stated age Attitude/Demeanor/Rapport: Engaged Affect (typically observed): Calm Orientation: : Oriented to Place, Oriented to Self, Oriented to  Time, Oriented to Situation Alcohol / Substance Use: Not Applicable Psych Involvement: No (comment)  Admission diagnosis:  Hypoxia [R09.02] NSTEMI (non-ST elevated myocardial infarction) ([place]) [I21.4] Acute respiratory failure with hypercapnia ([place]) [J96.02] Type 2 diabetes mellitus with diabetic polyneuropathy, with long-term current use of insulin ([place]) [E11.42, Z79.4] Stage 3 chronic kidney disease, unspecified whether stage 3a or 3b CKD ([place]) [N18.30] Patient Active Problem List   Diagnosis Date Noted   Ulcer of foot ([place]) 11/07/2020   Recurrent UTI 07/31/2020   Insomnia 05/25/2020   Advance care planning    Gastroesophageal reflux disease with esophagitis without hemorrhage    Ulcer of esophagus without bleeding    Urinary retention with incomplete bladder emptying 05/10/2020   Adjustment reaction of late  life    Acute kidney injury ([place])    Gross hematuria    Controlled type 2 diabetes mellitus with hyperglycemia, with long-term current use of insulin ([place])    Benign essential HTN    Anxiety 12/15/2019   Anemia 01/31/2017   Hypoxia    Status post lumbar laminectomy 12/26/2016    Class: Chronic   History of femur fracture 12/26/2016   Idiopathic chronic venous hypertension of left lower extremity with inflammation 11/07/2016   Spinal stenosis of lumbar region with neurogenic claudication 10/18/2016   COPD (chronic obstructive pulmonary disease) ([place]) 06/10/2016   Weakness generalized 03/18/2016   Critical lower limb ischemia ([place]) 02/28/2016   Peripheral arterial disease ([place]) 10/17/2015   Edema 07/19/2015   Claudication ([place]) 01/31/2015   DOE (dyspnea on exertion) 01/31/2015   HTN (hypertension) 01/31/2015   Chronic diastolic CHF (congestive heart failure) ([place]) 01/21/2014   Atrial fibrillation ([place]) 12/16/2012   Bladder cancer ([place]) 09/22/2011   Leg cramps 08/28/2011   Cough 05/26/2011   CAD (coronary artery disease) of artery bypass graft 05/09/2011   Dyspnea on exertion 04/19/2011   PSORIASIS, SCALP 10/25/2008   Chronic kidney disease, stage III (moderate) ([place]) 12/04/2007   Type 2 diabetes mellitus with diabetic polyneuropathy ([place]) 05/21/2007   DIVERTICULOSIS, COLON 05/21/2007  BPH (benign prostatic hyperplasia) 05/21/2007   PCP:  Tonia Ghent, MD Pharmacy:   [facility] [place], [place] [address] Phone: [phone] Fax: [phone]  [place], [place] [address] Phone: [phone] Fax: [phone]  [facility]  ([place], [place] [address] Phone: [phone] Fax: [phone]     Social Determinants  of Health (SDOH) Interventions    Readmission Risk Interventions Readmission Risk Prevention Plan 04/21/2020  Transportation Screening Complete  PCP or Specialist Appt within 5-7 Days Complete  Home Care Screening Complete  Medication Review (RN CM) Complete  Some recent data might be hidden

## 2021-06-12 NOTE — Progress Notes (Signed)
ANTICOAGULATION CONSULT NOTE  Pharmacy Consult for heparin Indication:  ACS/STEMI  Allergies  Allergen Reactions   Actos [Pioglitazone Hydrochloride] Other (See Comments)    Held 2012 bladder cancer   Aspirin Other (See Comments)    Held 2012 due to hematuria and bruising.    Ace Inhibitors Cough   Bactrim Rash and Other (See Comments)    Rash, presumed allergy   Gabapentin Nausea And Vomiting    Upset stomach and diarrhea - tolerates low doses   Lyrica [Pregabalin] Swelling    swelling   Pravastatin Swelling    Swelling of feet   Sulfa Drugs Cross Reactors Rash    Patient Measurements: Height: 6\' 2"  (188 cm) Weight: 95.3 kg (210 lb) IBW/kg (Calculated) : 82.2 Heparin Dosing Weight: 95.3 kg  Vital Signs: Temp: 98.8 F (37.1 C) (08/02 0400) Temp Source: Axillary (08/02 0400) BP: 108/51 (08/02 0400) Pulse Rate: 60 (08/02 0400)  Labs: Recent Labs    06/11/21 0732 06/11/21 0958 06/11/21 1331 06/11/21 1944 06/11/21 2203 06/12/21 0515  HGB 15.1  --   --   --   --  14.6  HCT 49.5  --   --   --   --  47.7  PLT 158  --   --   --   --  163  HEPARINUNFRC  --   --   --  0.16*  --  0.47  CREATININE 1.74*  --   --   --   --   --   TROPONINIHS 315* 283* 243*  --  184*  --      Estimated Creatinine Clearance: 32.8 mL/min (A) (by C-G formula based on SCr of 1.74 mg/dL (H)).   Medical History: Past Medical History:  Diagnosis Date   (HFpEF) heart failure with preserved ejection fraction (HCC)    Arthritis    Back pain    s/p lumbar injection 2014   BENIGN PROSTATIC HYPERTROPHY 05/21/2007   Bladder cancer ([place]) 10/2011   CAD (coronary artery disease)    nonobstructive by cath 6/12:  mid LAD 30%, proximal obtuse marginal-2 30%, proximal RCA 20%, mid RCA 30-40%.  He had normal cardiac output and mildly elevated filling pressures but no significant pulmonary hypertension;   Echocardiogram in May 2012 demonstrated EF 50-55% and left atrial enlargement    Cellulitis of left  foot    Hospitalized in 2006   Chronic kidney disease (CKD), stage III (moderate) (HCC) 12/04/2007   FOLLOWED BY DR PATEL   Chronic pain of lower extremity    Complication of anesthesia    1 time bladder cancer surgery- 2012 , oxygen satruratioon dropped had to stay overnight, no problems since then.   COPD (chronic obstructive pulmonary disease) ([place])    DIABETES MELLITUS, TYPE II 05/21/2007   DIVERTICULOSIS, COLON 05/21/2007   pt denies   Dyspnea    with exertion, patient mointors saturation   Fatigue AGE-RELATED   Gross hematuria    pt denies   HYPERTENSION 05/21/2007   Impaired hearing BILATERAL HEARING AIDS   On home oxygen therapy    as needed   PAD (peripheral artery disease) ([place])    a. 02/2016 L foot nonhealing ulcer-->Periph angio: L pop 100 w/ reconstitution via extensive vollats to the prox-mid peroneal (only patent vessel BK)-->PTA of L Pop and peroneal; b. Ongoing LE ischemia 5 & 06/2016 req amputation of toes on L foot.   Pneumonia    Hx  of    Psoriasis ELBOWS   Psoriasis  PSORIASIS, SCALP 10/25/2008    Medications:  Scheduled:   chlorhexidine  15 mL Mouth Rinse BID   Chlorhexidine Gluconate Cloth  6 each Topical Q0600   docusate sodium  100 mg Oral BID   doxycycline  50 mg Oral Daily   finasteride  5 mg Oral Daily   insulin aspart  0-5 Units Subcutaneous QHS   insulin aspart  0-9 Units Subcutaneous TID WC   mouth rinse  15 mL Mouth Rinse q12n4p   pantoprazole  80 mg Oral Daily   tamsulosin  0.8 mg Oral Daily   umeclidinium bromide  1 puff Inhalation Daily    Assessment: 87 year old male presenting with generalized weakness and low O2 saturations into the 80's.  Elevated D-dimer and troponins on presentation.  CBC stable.  No AC PTA. Pharmacy consulted to dose heparin for possible ACS/STEMI.   06/12/2021: Heparin level 0.47- therapeutic on IV heparin 1450 units/hr No bleeding or infusion related concerns per RN CBC: Hg/pltc WNL Troponin- trending  down  Goal of Therapy:  Heparin level 0.3-0.7 units/ml Monitor platelets by anticoagulation protocol: Yes   Plan:  Continue heparin drip at 1450 units/hr F/u 8 hour HL if not d/c'd Daily CBC and HL while on heparin Monitor for s/sx bleeding  [name], PharmD, BCPS 06/12/2021 5:50 AM

## 2021-06-12 NOTE — Progress Notes (Signed)
BLE venous duplex has been completed.  Results can be found under chart review under CV PROC. 06/12/2021 11:52 AM Abby Stines RVT, RDMS

## 2021-06-12 NOTE — Progress Notes (Signed)
Patients wife brought in medication from home to be dispensed by pharmacy. 36 counted PO tabs of 30mg  Apremilast. Medication was taken to pharmacy.  [name] Small 06/12/2021 0846 am

## 2021-06-12 NOTE — Progress Notes (Addendum)
Pt is awake, alert, no increased wob noted or respiratory distress voiced by pt at this time.  HR70, rr14, spo2 100% on 6L hfnc.  Bipap remains in room on standby but not indicated at this time.

## 2021-06-13 DIAGNOSIS — N1832 Chronic kidney disease, stage 3b: Secondary | ICD-10-CM

## 2021-06-13 DIAGNOSIS — J690 Pneumonitis due to inhalation of food and vomit: Secondary | ICD-10-CM

## 2021-06-13 DIAGNOSIS — N3020 Other chronic cystitis without hematuria: Secondary | ICD-10-CM

## 2021-06-13 DIAGNOSIS — I5033 Acute on chronic diastolic (congestive) heart failure: Secondary | ICD-10-CM

## 2021-06-13 DIAGNOSIS — I248 Other forms of acute ischemic heart disease: Secondary | ICD-10-CM

## 2021-06-13 LAB — GLUCOSE, CAPILLARY
Glucose-Capillary: 99 mg/dL (ref 70–99)
Glucose-Capillary: 127 mg/dL — ABNORMAL HIGH (ref 70–99)
Glucose-Capillary: 235 mg/dL — ABNORMAL HIGH (ref 70–99)
Glucose-Capillary: 202 mg/dL — ABNORMAL HIGH (ref 70–99)

## 2021-06-13 LAB — BASIC METABOLIC PANEL
Anion gap: 11 (ref 5–15)
BUN: 37 mg/dL — ABNORMAL HIGH (ref 8–23)
CO2: 38 mmol/L — ABNORMAL HIGH (ref 22–32)
Calcium: 8.4 mg/dL — ABNORMAL LOW (ref 8.9–10.3)
Chloride: 89 mmol/L — ABNORMAL LOW (ref 98–111)
Creatinine, Ser: 1.54 mg/dL — ABNORMAL HIGH (ref 0.61–1.24)
GFR, Estimated: 43 mL/min — ABNORMAL LOW (ref >60)
Glucose, Bld: 110 mg/dL — ABNORMAL HIGH (ref 70–99)
Potassium: 4 mmol/L (ref 3.5–5.1)
Sodium: 138 mmol/L (ref 135–145)

## 2021-06-13 LAB — CBC
HCT: 47.9 % (ref 39.0–52.0)
Hemoglobin: 14.8 g/dL (ref 13.0–17.0)
MCH: 32.7 pg (ref 26.0–34.0)
MCHC: 30.9 g/dL (ref 30.0–36.0)
MCV: 105.7 fL — ABNORMAL HIGH (ref 80.0–100.0)
Platelets: 158 10*3/uL (ref 150–400)
RBC: 4.53 MIL/uL (ref 4.22–5.81)
RDW: 13.3 % (ref 11.5–15.5)
WBC: 7.1 10*3/uL (ref 4.0–10.5)
nRBC: 0 % (ref 0.0–0.2)

## 2021-06-13 NOTE — Progress Notes (Signed)
Pt seen, resting comfortably, no increased wob / respiratory distress noted or voiced by pt at this time.  HR66, rr17, spo2 93% on 6lnc.  Bipap in room on standby but not indicated at this time.

## 2021-06-13 NOTE — Assessment & Plan Note (Signed)
-   on chronic doxycycline

## 2021-06-13 NOTE — Assessment & Plan Note (Addendum)
-  Suspected combination of aspiration pneumonitis, diastolic heart failure -ABG 7.297 / 78  -Off BiPAP -remained stable on 2.5 L oxygen during hospitalization.  This was continued at discharge and wife also informed that he may need increase in oxygen flow when exerting himself or up and walking then can be turned back down to baseline when resting

## 2021-06-13 NOTE — Progress Notes (Addendum)
Progress Note  Patient Name: [name] Small Date of Encounter: 06/13/2021  [facility] Cardiologist: [name], MD   Subjective   No acute overnight events. Patient very sleepy this morning. Would briefly wake up to answer questions and then go back to sleep. He states breathing is better. Laying flat in bed. On 4L of O2 via nasal cannula (on 2L at night at home). Denies any chest pain.   Inpatient Medications    Scheduled Meds:  Apremilast  30 mg Oral Daily   atorvastatin  10 mg Oral Daily   chlorhexidine  15 mL Mouth Rinse BID   Chlorhexidine Gluconate Cloth  6 each Topical Q0600   docusate sodium  100 mg Oral BID   doxycycline  50 mg Oral Daily   finasteride  5 mg Oral Daily   heparin  5,000 Units Subcutaneous Q8H   insulin aspart  0-5 Units Subcutaneous QHS   insulin aspart  0-9 Units Subcutaneous TID WC   mouth rinse  15 mL Mouth Rinse q12n4p   pantoprazole  80 mg Oral Daily   tamsulosin  0.8 mg Oral Daily   traZODone  25 mg Oral QHS   umeclidinium bromide  1 puff Inhalation Daily   Continuous Infusions:  PRN Meds: acetaminophen **OR** acetaminophen, ondansetron **OR** ondansetron (ZOFRAN) IV, traMADol   Vital Signs    Vitals:   06/13/21 0400 06/13/21 0500 06/13/21 0600 06/13/21 0700  BP: 126/61 (!) 125/54 (!) 142/59 (!) 144/60  Pulse: 65 64 65 63  Resp: 15 19 20  (!) 21  Temp:      TempSrc:      SpO2: 94% 93% 95% 95%  Weight:      Height:        Intake/Output Summary (Last 24 hours) at 06/13/2021 0728 Last data filed at 06/13/2021 0353 Gross per 24 hour  Intake 77.04 ml  Output 3275 ml  Net -3197.96 ml   Last 3 Weights 06/11/2021 11/07/2020 09/04/2020  Weight (lbs) 210 lb (No Data) 205 lb  Weight (kg) 95.255 kg (No Data) 92.987 kg      Telemetry    Normal sinus rhythm with rates in the 60s. - Personally Reviewed  ECG    No new ECG tracing today. - Personally Reviewed  Physical Exam   GEN: No acute distress.   Neck: No JVD. Cardiac:  RRR. No murmurs, rubs, or gallops.  Respiratory: Clear to auscultation bilaterally. GI: Soft, non-distended, and non-tender. MS: No lower extremity edema. S/p transmetatarsal amputation on the left.  Neuro:  No focal deficits.  Labs    High Sensitivity Troponin:   Recent Labs  Lab 06/11/21 0732 06/11/21 0958 06/11/21 1331 06/11/21 2203  TROPONINIHS 315* 283* 243* 184*      Chemistry Recent Labs  Lab 06/11/21 0732 06/12/21 0515 06/13/21 0235  NA 142 142 138  K 4.9 4.3 4.0  CL 98 93* 89*  CO2 37* 39* 38*  GLUCOSE 161* 50* 110*  BUN 41* 38* 37*  CREATININE 1.74* 1.54* 1.54*  CALCIUM 9.1 8.7* 8.4*  PROT 6.7 6.4*  --   ALBUMIN 3.6 3.3*  --   AST 12* 11*  --   ALT 9 8  --   ALKPHOS 52 54  --   BILITOT 0.7 1.0  --   GFRNONAA 37* 43* 43*  ANIONGAP 7 10 11      Hematology Recent Labs  Lab 06/11/21 0732 06/12/21 0515 06/13/21 0235  WBC 7.2 8.0 7.1  RBC 4.57 4.43  4.45 4.53  HGB 15.1 14.6 14.8  HCT 49.5 47.7 47.9  MCV 108.3* 107.7* 105.7*  MCH 33.0 33.0 32.7  MCHC 30.5 30.6 30.9  RDW 13.4 13.6 13.3  PLT 158 163 158    BNP Recent Labs  Lab 06/11/21 0732  BNP 304.5*     DDimer  Recent Labs  Lab 06/11/21 0732  DDIMER 1.26*     Radiology    CT Head Wo Contrast  Result Date: 06/11/2021 CLINICAL DATA:  Mental status changes. EXAM: CT HEAD WITHOUT CONTRAST TECHNIQUE: Contiguous axial images were obtained from the base of the skull through the vertex without intravenous contrast. COMPARISON:  04/24/2020 FINDINGS: Brain: There is atrophy and chronic small vessel disease changes. No acute intracranial abnormality. Specifically, no hemorrhage, hydrocephalus, mass lesion, acute infarction, or significant intracranial injury. Vascular: No hyperdense vessel or unexpected calcification. Skull: No acute calvarial abnormality. Sinuses/Orbits: No acute findings Other: None IMPRESSION: Atrophy, chronic microvascular disease. No acute intracranial abnormality.  Electronically Signed   By: Rolm Baptise M.D.   On: 06/11/2021 08:11   CT Angio Chest PE W and/or Wo Contrast  Result Date: 06/11/2021 CLINICAL DATA:  PE suspected EXAM: CT ANGIOGRAPHY CHEST WITH CONTRAST TECHNIQUE: Multidetector CT imaging of the chest was performed using the standard protocol during bolus administration of intravenous contrast. Multiplanar CT image reconstructions and MIPs were obtained to evaluate the vascular anatomy. CONTRAST:  44mL OMNIPAQUE IOHEXOL 350 MG/ML SOLN COMPARISON:  CT chest dated Apr 06, 2005 FINDINGS: Cardiovascular: Normal heart size. No pericardial effusion. Three-vessel coronary artery calcifications. Atherosclerotic disease of the thoracic aorta. No evidence of pulmonary embolus to the level of the proximal segmental pulmonary arteries. Adequate contrast opacification of the pulmonary arteries. Evaluation of the more distal pulmonary arteries is limited due to respiratory motion and streak artifact. Mediastinum/Nodes: No enlarged mediastinal, hilar, or axillary lymph nodes. Thyroid gland, trachea, and esophagus demonstrate no significant findings. Lungs/Pleura: Small right-greater-than-left pleural effusions with associated atelectasis centrilobular emphysema. Mild bronchial wall thickening, most pronounced in the lower lungs. Clustered nodules of the right lower lobe seen on series 3 image 259. Linear opacities in the right lower lobe, likely due to scarring or atelectasis. No consolidation, pleural effusion or pneumothorax. Central airways are patent. Upper Abdomen: No acute abnormality. Musculoskeletal: No chest wall abnormality. No acute or significant osseous findings. Review of the MIP images confirms the above findings. IMPRESSION: No evidence of pulmonary embolus to the level of the proximal segmental pulmonary arteries. Evaluation of the more distal pulmonary arteries is limited due to respiratory motion and streak artifact. Bilateral lower lobe predominant  bronchial wall thickening and clustered nodules of the right lower lobe, likely sequela of aspiration. Electronically Signed   By: Yetta Glassman MD   On: 06/11/2021 11:30   DG Chest Port 1 View  Result Date: 06/11/2021 CLINICAL DATA:  Shortness of breath. EXAM: PORTABLE CHEST 1 VIEW COMPARISON:  04/18/2020 FINDINGS: Heart size is normal. Bilateral lower lobe opacities identified concerning for atelectasis or pneumonia. Pulmonary vascular congestion without overt edema. No pleural effusions identified. IMPRESSION: Bilateral lower lobe opacities concerning for atelectasis or pneumonia. Electronically Signed   By: Kerby Moors M.D.   On: 06/11/2021 08:35   ECHOCARDIOGRAM COMPLETE  Result Date: 06/11/2021    ECHOCARDIOGRAM REPORT   Patient Name:   [name] Small Date of Exam: 06/11/2021 Medical Rec #:  [id number]       Height:       74.0 in Accession #:    [id number]  Weight:       210.0 lb Date of Birth:  09-15-1928      BSA:          2.219 m Patient Age:    87 years        BP:           147/101 mmHg Patient Gender: M               HR:           96 bpm. Exam Location:  Inpatient Procedure: 2D Echo, Cardiac Doppler and Color Doppler Indications:    I25-125.9 Ischemic heart disease  History:        Patient has prior history of Echocardiogram examinations, most                 recent 01/18/2014. CHF, Abnormal ECG, COPD, Arrythmias:Atrial                 Fibrillation, Signs/Symptoms:Shortness of Breath and Dyspnea;                 Risk Factors:Diabetes and Hypertension. Hypoxia.  Sonographer:    Roseanna Rainbow RDCS Referring Phys: [id number] ANNA Ree Kida  Sonographer Comments: Technically difficult study due to poor echo windows and suboptimal parasternal window. Image acquisition challenging due to COPD. Patient supine. Patient moving throughout exam. IMPRESSIONS  1. Left ventricular ejection fraction, by estimation, is 55 to 60%. The left ventricle has normal function. The left ventricle has no regional wall motion  abnormalities. There is mild left ventricular hypertrophy. Left ventricular diastolic parameters are consistent with Grade II diastolic dysfunction (pseudonormalization). Elevated left atrial pressure.  2. Right ventricular systolic function is normal. The right ventricular size is mildly enlarged. There is moderately elevated pulmonary artery systolic pressure.  3. The mitral valve is normal in structure. No evidence of mitral valve regurgitation. No evidence of mitral stenosis.  4. The aortic valve is tricuspid. Aortic valve regurgitation is not visualized. Mild to moderate aortic valve sclerosis/calcification is present, without any evidence of aortic stenosis. FINDINGS  Left Ventricle: Left ventricular ejection fraction, by estimation, is 55 to 60%. The left ventricle has normal function. The left ventricle has no regional wall motion abnormalities. The left ventricular internal cavity size was normal in size. There is  mild left ventricular hypertrophy. Left ventricular diastolic parameters are consistent with Grade II diastolic dysfunction (pseudonormalization). Elevated left atrial pressure. Right Ventricle: The right ventricular size is mildly enlarged. Right vetricular wall thickness was not well visualized. Right ventricular systolic function is normal. There is moderately elevated pulmonary artery systolic pressure. The tricuspid regurgitant velocity is 3.27 m/s, and with an assumed right atrial pressure of 8 mmHg, the estimated right ventricular systolic pressure is 40.8 mmHg. Left Atrium: Left atrial size was normal in size. Right Atrium: Right atrial size was normal in size. Pericardium: There is no evidence of pericardial effusion. Mitral Valve: The mitral valve is normal in structure. No evidence of mitral valve regurgitation. No evidence of mitral valve stenosis. Tricuspid Valve: The tricuspid valve is normal in structure. Tricuspid valve regurgitation is mild. Aortic Valve: The aortic valve is  tricuspid. Aortic valve regurgitation is not visualized. Mild to moderate aortic valve sclerosis/calcification is present, without any evidence of aortic stenosis. Pulmonic Valve: The pulmonic valve was not well visualized. Pulmonic valve regurgitation is not visualized. Aorta: The aortic root and ascending aorta are structurally normal, with no evidence of dilitation. IAS/Shunts: The interatrial septum was not well visualized.  LEFT VENTRICLE PLAX 2D LVIDd:         4.50 cm      Diastology LVIDs:         3.40 cm      LV e' medial:    6.53 cm/s LV PW:         1.10 cm      LV E/e' medial:  11.3 LV IVS:        1.15 cm      LV e' lateral:   3.59 cm/s LVOT diam:     2.40 cm      LV E/e' lateral: 20.6 LV SV:         109 LV SV Index:   49 LVOT Area:     4.52 cm  LV Volumes (MOD) LV vol d, MOD A2C: 85.4 ml LV vol d, MOD A4C: 120.0 ml LV vol s, MOD A2C: 31.7 ml LV vol s, MOD A4C: 53.8 ml LV SV MOD A2C:     53.7 ml LV SV MOD A4C:     120.0 ml LV SV MOD BP:      64.7 ml RIGHT VENTRICLE RV S prime:     11.00 cm/s TAPSE (M-mode): 2.3 cm LEFT ATRIUM             Index       RIGHT ATRIUM           Index LA diam:        3.70 cm 1.67 cm/m  RA Area:     18.20 cm LA Vol (A2C):   38.2 ml 17.21 ml/m RA Volume:   52.40 ml  23.61 ml/m LA Vol (A4C):   34.7 ml 15.64 ml/m LA Biplane Vol: 36.3 ml 16.36 ml/m  AORTIC VALVE LVOT Vmax:   101.00 cm/s LVOT Vmean:  68.700 cm/s LVOT VTI:    0.242 m  AORTA Ao Root diam: 3.30 cm Ao Asc diam:  3.60 cm MITRAL VALVE                TRICUSPID VALVE MV Area (PHT): 3.77 cm     TR Peak grad:   42.8 mmHg MV Decel Time: 201 msec     TR Vmax:        327.00 cm/s MV E velocity: 74.10 cm/s MV A velocity: 107.00 cm/s  SHUNTS MV E/A ratio:  0.69         Systemic VTI:  0.24 m                             Systemic Diam: 2.40 cm Oswaldo Milian MD Electronically signed by Oswaldo Milian MD Signature Date/Time: 06/11/2021/4:39:51 PM    Final    VAS [place] LOWER EXTREMITY VENOUS (DVT)  Result Date:  06/12/2021  Lower Venous DVT Study Patient Name:  [name] Small  Date of Exam:   06/12/2021 Medical Rec #: [id number]        Accession #:    [id number] Date of Birth: 1928-07-09       Patient Gender: M Patient Age:   89Y Exam Location:  [facility] Procedure:      VAS [place] LOWER EXTREMITY VENOUS (DVT) Referring Phys: [id number] JEFFREY T MCCLUNG --------------------------------------------------------------------------------  Indications: Hypoxia and weakness.  Limitations: Scanning environment - patient in room with no blinds and direct sunlight. Performing Technologist: Rogelia Rohrer RVT, RDMS  Examination Guidelines: A complete evaluation includes B-mode imaging, spectral Doppler, color Doppler, and power  Doppler as needed of all accessible portions of each vessel. Bilateral testing is considered an integral part of a complete examination. Limited examinations for reoccurring indications may be performed as noted. The reflux portion of the exam is performed with the patient in reverse Trendelenburg.  +---------+---------------+---------+-----------+----------+--------------+ RIGHT    CompressibilityPhasicitySpontaneityPropertiesThrombus Aging +---------+---------------+---------+-----------+----------+--------------+ CFV      Full           Yes      Yes                                 +---------+---------------+---------+-----------+----------+--------------+ SFJ      Full                                                        +---------+---------------+---------+-----------+----------+--------------+ FV Prox  Full           Yes      Yes                                 +---------+---------------+---------+-----------+----------+--------------+ FV Mid   Full           Yes      Yes                                 +---------+---------------+---------+-----------+----------+--------------+ FV DistalFull           Yes      Yes                                  +---------+---------------+---------+-----------+----------+--------------+ PFV      Full                                                        +---------+---------------+---------+-----------+----------+--------------+ POP      Full           Yes      Yes                                 +---------+---------------+---------+-----------+----------+--------------+ PTV      Full                                                        +---------+---------------+---------+-----------+----------+--------------+ PERO     Full                                                        +---------+---------------+---------+-----------+----------+--------------+   +---------+---------------+---------+-----------+----------+--------------+ LEFT     CompressibilityPhasicitySpontaneityPropertiesThrombus Aging +---------+---------------+---------+-----------+----------+--------------+ CFV      Full  Yes      Yes                                 +---------+---------------+---------+-----------+----------+--------------+ SFJ      Full                                                        +---------+---------------+---------+-----------+----------+--------------+ FV Prox  Full           Yes      Yes                                 +---------+---------------+---------+-----------+----------+--------------+ FV Mid   Full           Yes      Yes                                 +---------+---------------+---------+-----------+----------+--------------+ FV DistalFull           Yes      Yes                                 +---------+---------------+---------+-----------+----------+--------------+ PFV      Full                                                        +---------+---------------+---------+-----------+----------+--------------+ POP      Full           Yes      Yes                                  +---------+---------------+---------+-----------+----------+--------------+ PTV      Full                                                        +---------+---------------+---------+-----------+----------+--------------+ PERO     Full                                                        +---------+---------------+---------+-----------+----------+--------------+     Summary: BILATERAL: - No evidence of deep vein thrombosis seen in the lower extremities, bilaterally. - No evidence of superficial venous thrombosis in the lower extremities, bilaterally. -No evidence of popliteal cyst, bilaterally.   *See table(s) above for measurements and observations. Electronically signed by Deitra Mayo MD on 06/12/2021 at 5:02:59 PM.    Final     Cardiac Studies   Echocardiogram 06/11/2021: Impressions:  1. Left ventricular ejection fraction, by estimation, is 55 to 60%. The  left ventricle has normal  function. The left ventricle has no regional  wall motion abnormalities. There is mild left ventricular hypertrophy.  Left ventricular diastolic parameters  are consistent with Grade II diastolic dysfunction (pseudonormalization).  Elevated left atrial pressure.   2. Right ventricular systolic function is normal. The right ventricular  size is mildly enlarged. There is moderately elevated pulmonary artery  systolic pressure.   3. The mitral valve is normal in structure. No evidence of mitral valve  regurgitation. No evidence of mitral stenosis.   4. The aortic valve is tricuspid. Aortic valve regurgitation is not  visualized. Mild to moderate aortic valve sclerosis/calcification is  present, without any evidence of aortic stenosis.   Patient Profile     87 y.o. male with a history of non-obstructive CAD on cardiac catheterization in 2012, PAD, COPD, hypertension, type 2 diabetes mellitus, CKD stage IV, bladder cancer, and chronic cystitis who is being seen for evaluation of elevated troponin  at the request of Dr. Joya Gaskins.  Assessment & Plan    Acute Hypoxic Respiratory Failure - Required BiPAP intermittently.  - BNP elevated at 304.5. Chest x-ray showed bilateral lower lobe opacities concerning for atelectasis or pneumonia but no overt edema.  - Echo showed LVEF of 55-60% with grade 2 diastolic dysfunction. - Chest CTA negative for PE. - Possibly secondary to aspiration pneumonia. Daughter reported patient's coughs/chokes sometimes while eating. Will consult speech therapy. Possible mild diastolic CHF component as well. Please see recommendations for diuresis below. Otherwise, per primary team.  Mild Acute on Chronic Diastolic CHF - BNP and chest x-ray as above. - Echo showed LVEF of 55-60% with grade 2 diastolic dysfunction. RV mildly enlarged with normal systolic function and moderately elevated PASP. - Patient has received 2 doses of IV Lasix. Last dose on 06/12/2021. Documented urine output of 3.2 L in the last 24 hours and net negative 7.1 L since admission. No documented weights since admission. Renal function stable. - Appears euvolemic on exam. Do not think any additional IV Lasix is needed. -Patient previously on PO Lasix three times per week but wife states this was stopped about 1 year ago. We may need to restart this or PRN Lasix at discharge. Will discuss with MD. - Continue to monitor volume status closely.  Elevated Troponin - High-sensitivity troponin mildly elevated and flat/down-trending at 315 >> 283 >> 243 >> 184. - EKG showed no acute ischemic changes.  - Echo showed normal LV function and wall motion. - Patient denies any angina. - Not felt to be consistent with ACS. Likely demand ischemia from acute hypoxic respiratory failure. No ischemic evaluation planned at this time.  Non-Obstructive CAD - Noted on cardiac catheterization in 2012. Chest CTA this admission did note 3 vessel coronary artery calcification. - No angina.  - Continue statin.  PAD -  S/p angioplasty to left popliteal and peroneal artery in 2017 with subsequent ambulation of toes on left foot. - Not on aspirin at home due to previous problems with hematuria and bruising. - Continue statin.  Hypertension - Not on any antihypertensive at home. - Systolic BP ranges from 517 to 173. - Given age, would not be overly aggressive in treating this but may benefit from low dose antihypertensive if systolic BP consistently above 150. Will defer to primary team.  Hyperlipidemia - LDL 65 in 03/2021. - Continue home Lipitor 10mg  daily.  Type 2 Diabetes Mellitus - Hemoglobin A1c 8.2 this admission. - Management per primary team.  CKD Stage IV - Creatinine stable at 1.54. -  Continue to monitor.  Of note, wife expressed concern that patient needs PT at home. Will consult PT/OT. Wife states daughter was also wondering if Palliative Care would be a good idea. Did discuss this with wife and recommended speaking to medicine team if they decide this is what they want.  Otherwise, per primary team.  For questions or updates, please contact [place] Please consult [email or web address] for contact info under        Signed, Darreld Mclean, PA-C  06/13/2021, 7:28 AM    Patient seen and examined with Sande Rives, PA-C.  Agree as above, with the following exceptions and changes as noted below.  I had a chance to visit with the patient, his wife, and his son in the room today.  He is in good spirits and remains in the ICU. Gen: NAD, CV: RRR, no murmurs, Lungs: clear, Abd: soft, Extrem: Warm, well perfused, no edema, Neuro/Psych: alert and oriented x 3, normal mood and affect. All available labs, radiology testing, previous records reviewed.  He responded well to 2 doses of diuretics and has had a downtrend in his high-sensitivity troponin.  Speech therapy was working with the patient when I arrived for swallow evaluation.  Unclear what the inciting event was, but if he does have chronic  aspiration either from GERD or when he eats, he may have had some primary pulmonary respiratory failure which led to an acute episode of diastolic heart failure leading to pleural effusions and worsening of shortness of breath and hypoxia.  I described the sequence of events in detail for the family and stated that while we cannot be sure, avoiding future aspiration events is likely prudent.  When patient is ready for hospital discharge, would recommend as needed Lasix prescription and I have described for the patient's wife when to call the office for guidance on when to use Lasix.  I do not believe he needs ongoing diuresis in hospital but this can be used if needed for shortness of breath or difficulty weaning oxygen as he does not use this in the daytime.  Cardiology will sign off at this time, do not hesitate to contact our service with questions or concerns.  Elouise Munroe, MD 06/13/21 4:46 PM

## 2021-06-13 NOTE — Assessment & Plan Note (Signed)
-  Hold off on antibiotics

## 2021-06-13 NOTE — Assessment & Plan Note (Signed)
-  Continue Lipitor °

## 2021-06-13 NOTE — Progress Notes (Signed)
Progress Note    [name] Small   HFW:[id number]  DOB: December 02, 1927  DOA: 06/11/2021     2  PCP: Tonia Ghent, MD  CC: Weakness  Hospital Course: [name] Small is a 87yo w/ a hx of nonobstructive CAD (cath 2012), PAD, CKD stage 3, HTN, DM2, and bladder CA who presented to the ED w/ the acute onset of generalized weakness and hypoxia. He requires nocturnal 2L [place] O2 support at baseline, but noted sats in the 80s the morning of admission. He was feeling fatigued in association with this finding. He admitted to not feeling well since at least 05/31/21 with poor intake of solids or liquids, generalized weakness, and decreased UOP (via his usual I/O caths).   In the ER he was noted to have elevated troponins and an elevated d-dimer. CTA revealed no large PE, but did note R > LLL infiltrates suggestive of a possible aspiration. His resp difficutly worsened during his ER stay, requiring use of BiPAP to maintain sats at >90%.   Patient was weaned off BiPAP to [place] He remained lethargic with poor oral intake during hospitalization.  His wife elected for having palliative care come discuss further goals of care and to assist with possibly obtaining more help in the home at time of discharge.    Interval History:  No events overnight.  Wife present bedside this morning.  Patient remains lethargic laying in bed mostly nodding his head yes or no but not wanting to answer open-ended questions. Wife was wishing for palliative care to be consulted and also asking about obtaining more help in the home with an aide and/or physical therapy.  ROS: Constitutional: positive for fatigue and malaise, Respiratory: negative for cough, Cardiovascular: negative for chest pain, and Gastrointestinal: negative for abdominal pain  Assessment & Plan: * Acute respiratory failure with hypoxia and hypercapnia (HCC) -Suspected combination of aspiration pneumonitis, diastolic heart failure -ABG 7.297 / 78  -Off  BiPAP -Continue to wean oxygen to baseline use of 2 L   Aspiration pneumonia ([place]) -Hold off on antibiotics  Chronic cystitis - on chronic doxycycline   Chronic kidney disease, stage 3b (HCC) -Baseline Cr ~1.8-2.3  -Stable  Demand ischemia ([place]) -Likely demand ischemia -Cardiology following -IV heparin discontinued  Hyperlipidemia - Continue Lipitor  Acute on chronic diastolic CHF (congestive heart failure) (HCC) -BNP 304.5 -Echo EF 55-60%, grade 2 dd -Cardiology following - lasix per cardiology   DMII (diabetes mellitus, type 2) (HCC) -Ha1c 8.2 -Continue sliding scale insulin    Old records reviewed in assessment of this patient  Antimicrobials: Chronic doxycycline  DVT prophylaxis: heparin injection 5,000 Units Start: 06/12/21 1400   Code Status:   Code Status: DNR Family Communication: Wife  Disposition Plan: Status is: Inpatient  Remains inpatient appropriate because:Unsafe d/c plan and Inpatient level of care appropriate due to severity of illness  Dispo: The patient is from: Home              Anticipated d/c is to: Home              Patient currently is not medically stable to d/c.   Difficult to place patient No      Risk of unplanned readmission score: Unplanned Admission- Pilot do not use: 19.21   Objective: Blood pressure (!) 144/60, pulse 63, temperature 98.7 F (37.1 C), temperature source Oral, resp. rate (!) 21, height 6\' 2"  (1.88 m), weight 95.3 kg, SpO2 90 %.  Examination: General appearance: cooperative, fatigued, and  no distress Head: Normocephalic, without obvious abnormality, atraumatic Eyes:  EOMI Lungs:  Clear anterior breath sounds Heart: regular rate and rhythm and S1, S2 normal Abdomen: normal findings: bowel sounds normal and soft, non-tender Extremities:  No edema Skin: mobility and turgor normal Neurologic: Grossly normal  Consultants:  Cardiology Palliative care  Procedures:    Data Reviewed: I have  personally reviewed following labs and imaging studies Results for orders placed or performed during the hospital encounter of 06/11/21 (from the past 24 hour(s))  Glucose, capillary     Status: Abnormal   Collection Time: 06/12/21  4:23 PM  Result Value Ref Range   Glucose-Capillary 128 (H) 70 - 99 mg/dL  Glucose, capillary     Status: Abnormal   Collection Time: 06/12/21  5:58 PM  Result Value Ref Range   Glucose-Capillary 153 (H) 70 - 99 mg/dL  Glucose, capillary     Status: Abnormal   Collection Time: 06/12/21  9:09 PM  Result Value Ref Range   Glucose-Capillary 110 (H) 70 - 99 mg/dL  Basic metabolic panel     Status: Abnormal   Collection Time: 06/13/21  2:35 AM  Result Value Ref Range   Sodium 138 135 - 145 mmol/L   Potassium 4.0 3.5 - 5.1 mmol/L   Chloride 89 (L) 98 - 111 mmol/L   CO2 38 (H) 22 - 32 mmol/L   Glucose, Bld 110 (H) 70 - 99 mg/dL   BUN 37 (H) 8 - 23 mg/dL   Creatinine, Ser 1.54 (H) 0.61 - 1.24 mg/dL   Calcium 8.4 (L) 8.9 - 10.3 mg/dL   GFR, Estimated 43 (L) >60 mL/min   Anion gap 11 5 - 15  CBC     Status: Abnormal   Collection Time: 06/13/21  2:35 AM  Result Value Ref Range   WBC 7.1 4.0 - 10.5 K/uL   RBC 4.53 4.22 - 5.81 MIL/uL   Hemoglobin 14.8 13.0 - 17.0 g/dL   HCT 47.9 39.0 - 52.0 %   MCV 105.7 (H) 80.0 - 100.0 fL   MCH 32.7 26.0 - 34.0 pg   MCHC 30.9 30.0 - 36.0 g/dL   RDW 13.3 11.5 - 15.5 %   Platelets 158 150 - 400 K/uL   nRBC 0.0 0.0 - 0.2 %  Glucose, capillary     Status: None   Collection Time: 06/13/21  7:47 AM  Result Value Ref Range   Glucose-Capillary 99 70 - 99 mg/dL   Comment 1 Notify RN    Comment 2 Document in Chart   Glucose, capillary     Status: Abnormal   Collection Time: 06/13/21 12:08 PM  Result Value Ref Range   Glucose-Capillary 127 (H) 70 - 99 mg/dL   Comment 1 Notify RN    Comment 2 Document in Chart     Recent Results (from the past 240 hour(s))  Resp Panel by RT-PCR (Flu A&B, Covid) Nasopharyngeal Swab      Status: None   Collection Time: 06/11/21  8:48 AM   Specimen: Nasopharyngeal Swab; Nasopharyngeal(NP) swabs in vial transport medium  Result Value Ref Range Status   SARS Coronavirus 2 by RT PCR NEGATIVE NEGATIVE Final    Comment: (NOTE) SARS-CoV-2 target nucleic acids are NOT DETECTED.  The SARS-CoV-2 RNA is generally detectable in upper respiratory specimens during the acute phase of infection. The lowest concentration of SARS-CoV-2 viral copies this assay can detect is 138 copies/mL. A negative result does not preclude SARS-Cov-2 infection and should not  be used as the sole basis for treatment or other patient management decisions. A negative result may occur with  improper specimen collection/handling, submission of specimen other than nasopharyngeal swab, presence of viral mutation(s) within the areas targeted by this assay, and inadequate number of viral copies(<138 copies/mL). A negative result must be combined with clinical observations, patient history, and epidemiological information. The expected result is Negative.  Fact Sheet for Patients:  EntrepreneurPulse.com.au  Fact Sheet for Healthcare Providers:  IncredibleEmployment.be  This test is no t yet approved or cleared by the [place] FDA and  has been authorized for detection and/or diagnosis of SARS-CoV-2 by FDA under an Emergency Use Authorization (EUA). This EUA will remain  in effect (meaning this test can be used) for the duration of the COVID-19 declaration under Section 564(b)(1) of the Act, 21 U.S.C.section 360bbb-3(b)(1), unless the authorization is terminated  or revoked sooner.       Influenza A by PCR NEGATIVE NEGATIVE Final   Influenza B by PCR NEGATIVE NEGATIVE Final    Comment: (NOTE) The Xpert Xpress SARS-CoV-2/FLU/RSV plus assay is intended as an aid in the diagnosis of influenza from Nasopharyngeal swab specimens and should not be used as a sole basis  for treatment. Nasal washings and aspirates are unacceptable for Xpert Xpress SARS-CoV-2/FLU/RSV testing.  Fact Sheet for Patients: EntrepreneurPulse.com.au  Fact Sheet for Healthcare Providers: IncredibleEmployment.be  This test is not yet approved or cleared by the [place] FDA and has been authorized for detection and/or diagnosis of SARS-CoV-2 by FDA under an Emergency Use Authorization (EUA). This EUA will remain in effect (meaning this test can be used) for the duration of the COVID-19 declaration under Section 564(b)(1) of the Act, 21 U.S.C. section 360bbb-3(b)(1), unless the authorization is terminated or revoked.  Performed at [facility], [place] [address] [place],  [address]      Radiology Studies: ECHOCARDIOGRAM COMPLETE  Result Date: 06/11/2021    ECHOCARDIOGRAM REPORT   Patient Name:   NORLAN RANN Date of Exam: 06/11/2021 Medical Rec #:  [id number]       Height:       74.0 in Accession #:    [id number]      Weight:       210.0 lb Date of Birth:  05/12/1928      BSA:          2.219 m Patient Age:    53 years        BP:           147/101 mmHg Patient Gender: M               HR:           96 bpm. Exam Location:  Inpatient Procedure: 2D Echo, Cardiac Doppler and Color Doppler Indications:    I25-125.9 Ischemic heart disease  History:        Patient has prior history of Echocardiogram examinations, most                 recent 01/18/2014. CHF, Abnormal ECG, COPD, Arrythmias:Atrial                 Fibrillation, Signs/Symptoms:Shortness of Breath and Dyspnea;                 Risk Factors:Diabetes and Hypertension. Hypoxia.  Sonographer:    Roseanna Rainbow RDCS Referring Phys: [id number] ANNA Ree Kida  Sonographer Comments: Technically difficult study due to poor echo windows and suboptimal parasternal  window. Image acquisition challenging due to COPD. Patient supine. Patient moving throughout exam. IMPRESSIONS  1. Left  ventricular ejection fraction, by estimation, is 55 to 60%. The left ventricle has normal function. The left ventricle has no regional wall motion abnormalities. There is mild left ventricular hypertrophy. Left ventricular diastolic parameters are consistent with Grade II diastolic dysfunction (pseudonormalization). Elevated left atrial pressure.  2. Right ventricular systolic function is normal. The right ventricular size is mildly enlarged. There is moderately elevated pulmonary artery systolic pressure.  3. The mitral valve is normal in structure. No evidence of mitral valve regurgitation. No evidence of mitral stenosis.  4. The aortic valve is tricuspid. Aortic valve regurgitation is not visualized. Mild to moderate aortic valve sclerosis/calcification is present, without any evidence of aortic stenosis. FINDINGS  Left Ventricle: Left ventricular ejection fraction, by estimation, is 55 to 60%. The left ventricle has normal function. The left ventricle has no regional wall motion abnormalities. The left ventricular internal cavity size was normal in size. There is  mild left ventricular hypertrophy. Left ventricular diastolic parameters are consistent with Grade II diastolic dysfunction (pseudonormalization). Elevated left atrial pressure. Right Ventricle: The right ventricular size is mildly enlarged. Right vetricular wall thickness was not well visualized. Right ventricular systolic function is normal. There is moderately elevated pulmonary artery systolic pressure. The tricuspid regurgitant velocity is 3.27 m/s, and with an assumed right atrial pressure of 8 mmHg, the estimated right ventricular systolic pressure is 42.8 mmHg. Left Atrium: Left atrial size was normal in size. Right Atrium: Right atrial size was normal in size. Pericardium: There is no evidence of pericardial effusion. Mitral Valve: The mitral valve is normal in structure. No evidence of mitral valve regurgitation. No evidence of mitral valve  stenosis. Tricuspid Valve: The tricuspid valve is normal in structure. Tricuspid valve regurgitation is mild. Aortic Valve: The aortic valve is tricuspid. Aortic valve regurgitation is not visualized. Mild to moderate aortic valve sclerosis/calcification is present, without any evidence of aortic stenosis. Pulmonic Valve: The pulmonic valve was not well visualized. Pulmonic valve regurgitation is not visualized. Aorta: The aortic root and ascending aorta are structurally normal, with no evidence of dilitation. IAS/Shunts: The interatrial septum was not well visualized.  LEFT VENTRICLE PLAX 2D LVIDd:         4.50 cm      Diastology LVIDs:         3.40 cm      LV e' medial:    6.53 cm/s LV PW:         1.10 cm      LV E/e' medial:  11.3 LV IVS:        1.15 cm      LV e' lateral:   3.59 cm/s LVOT diam:     2.40 cm      LV E/e' lateral: 20.6 LV SV:         109 LV SV Index:   49 LVOT Area:     4.52 cm  LV Volumes (MOD) LV vol d, MOD A2C: 85.4 ml LV vol d, MOD A4C: 120.0 ml LV vol s, MOD A2C: 31.7 ml LV vol s, MOD A4C: 53.8 ml LV SV MOD A2C:     53.7 ml LV SV MOD A4C:     120.0 ml LV SV MOD BP:      64.7 ml RIGHT VENTRICLE RV S prime:     11.00 cm/s TAPSE (M-mode): 2.3 cm LEFT ATRIUM  Index       RIGHT ATRIUM           Index LA diam:        3.70 cm 1.67 cm/m  RA Area:     18.20 cm LA Vol (A2C):   38.2 ml 17.21 ml/m RA Volume:   52.40 ml  23.61 ml/m LA Vol (A4C):   34.7 ml 15.64 ml/m LA Biplane Vol: 36.3 ml 16.36 ml/m  AORTIC VALVE LVOT Vmax:   101.00 cm/s LVOT Vmean:  68.700 cm/s LVOT VTI:    0.242 m  AORTA Ao Root diam: 3.30 cm Ao Asc diam:  3.60 cm MITRAL VALVE                TRICUSPID VALVE MV Area (PHT): 3.77 cm     TR Peak grad:   42.8 mmHg MV Decel Time: 201 msec     TR Vmax:        327.00 cm/s MV E velocity: 74.10 cm/s MV A velocity: 107.00 cm/s  SHUNTS MV E/A ratio:  0.69         Systemic VTI:  0.24 m                             Systemic Diam: 2.40 cm Oswaldo Milian MD Electronically signed  by Oswaldo Milian MD Signature Date/Time: 06/11/2021/4:39:51 PM    Final    VAS [place] LOWER EXTREMITY VENOUS (DVT)  Result Date: 06/12/2021  Lower Venous DVT Study Patient Name:  PHOENIX DRESSER  Date of Exam:   06/12/2021 Medical Rec #: [id number]        Accession #:    [id number] Date of Birth: 12/16/1927       Patient Gender: M Patient Age:   89Y Exam Location:  [facility] Procedure:      VAS [place] LOWER EXTREMITY VENOUS (DVT) Referring Phys: [id number] JEFFREY T MCCLUNG --------------------------------------------------------------------------------  Indications: Hypoxia and weakness.  Limitations: Scanning environment - patient in room with no blinds and direct sunlight. Performing Technologist: Rogelia Rohrer RVT, RDMS  Examination Guidelines: A complete evaluation includes B-mode imaging, spectral Doppler, color Doppler, and power Doppler as needed of all accessible portions of each vessel. Bilateral testing is considered an integral part of a complete examination. Limited examinations for reoccurring indications may be performed as noted. The reflux portion of the exam is performed with the patient in reverse Trendelenburg.  +---------+---------------+---------+-----------+----------+--------------+ RIGHT    CompressibilityPhasicitySpontaneityPropertiesThrombus Aging +---------+---------------+---------+-----------+----------+--------------+ CFV      Full           Yes      Yes                                 +---------+---------------+---------+-----------+----------+--------------+ SFJ      Full                                                        +---------+---------------+---------+-----------+----------+--------------+ FV Prox  Full           Yes      Yes                                 +---------+---------------+---------+-----------+----------+--------------+  FV Mid   Full           Yes      Yes                                  +---------+---------------+---------+-----------+----------+--------------+ FV DistalFull           Yes      Yes                                 +---------+---------------+---------+-----------+----------+--------------+ PFV      Full                                                        +---------+---------------+---------+-----------+----------+--------------+ POP      Full           Yes      Yes                                 +---------+---------------+---------+-----------+----------+--------------+ PTV      Full                                                        +---------+---------------+---------+-----------+----------+--------------+ PERO     Full                                                        +---------+---------------+---------+-----------+----------+--------------+   +---------+---------------+---------+-----------+----------+--------------+ LEFT     CompressibilityPhasicitySpontaneityPropertiesThrombus Aging +---------+---------------+---------+-----------+----------+--------------+ CFV      Full           Yes      Yes                                 +---------+---------------+---------+-----------+----------+--------------+ SFJ      Full                                                        +---------+---------------+---------+-----------+----------+--------------+ FV Prox  Full           Yes      Yes                                 +---------+---------------+---------+-----------+----------+--------------+ FV Mid   Full           Yes      Yes                                 +---------+---------------+---------+-----------+----------+--------------+ FV DistalFull  Yes      Yes                                 +---------+---------------+---------+-----------+----------+--------------+ PFV      Full                                                         +---------+---------------+---------+-----------+----------+--------------+ POP      Full           Yes      Yes                                 +---------+---------------+---------+-----------+----------+--------------+ PTV      Full                                                        +---------+---------------+---------+-----------+----------+--------------+ PERO     Full                                                        +---------+---------------+---------+-----------+----------+--------------+     Summary: BILATERAL: - No evidence of deep vein thrombosis seen in the lower extremities, bilaterally. - No evidence of superficial venous thrombosis in the lower extremities, bilaterally. -No evidence of popliteal cyst, bilaterally.   *See table(s) above for measurements and observations. Electronically signed by Deitra Mayo MD on 06/12/2021 at 5:02:59 PM.    Final    VAS [place] LOWER EXTREMITY VENOUS (DVT)  Final Result    CT Angio Chest PE W and/or Wo Contrast  Final Result    DG Chest [facility] 1 View  Final Result    CT Head Wo Contrast  Final Result      Scheduled Meds:  Apremilast  30 mg Oral Daily   atorvastatin  10 mg Oral Daily   chlorhexidine  15 mL Mouth Rinse BID   Chlorhexidine Gluconate Cloth  6 each Topical Q0600   docusate sodium  100 mg Oral BID   doxycycline  50 mg Oral Daily   finasteride  5 mg Oral Daily   heparin  5,000 Units Subcutaneous Q8H   insulin aspart  0-5 Units Subcutaneous QHS   insulin aspart  0-9 Units Subcutaneous TID WC   mouth rinse  15 mL Mouth Rinse q12n4p   pantoprazole  80 mg Oral Daily   tamsulosin  0.8 mg Oral Daily   traZODone  25 mg Oral QHS   umeclidinium bromide  1 puff Inhalation Daily   PRN Meds: acetaminophen **OR** acetaminophen, ondansetron **OR** ondansetron (ZOFRAN) IV, traMADol Continuous Infusions:   LOS: 2 days  Time spent: Greater than 50% of the 35 minute visit was spent in counseling/coordination of  care for the patient as laid out in the A&P.   Dwyane Dee, MD [facility] 06/13/2021, 1:08 PM

## 2021-06-13 NOTE — Assessment & Plan Note (Signed)
-  Baseline Cr ~1.8-2.3 -Stable

## 2021-06-13 NOTE — Hospital Course (Addendum)
DAION GINSBERG is a 87yo w/ a hx of nonobstructive CAD (cath 2012), PAD, CKD stage 3, HTN, DM2, and bladder CA who presented to the ED w/ the acute onset of generalized weakness and hypoxia. He requires nocturnal 2L [place] O2 support at baseline, but noted sats in the 80s the morning of admission. He was feeling fatigued in association with this finding. He admitted to not feeling well since at least 05/31/21 with poor intake of solids or liquids, generalized weakness, and decreased UOP (via his usual I/O caths).   In the ER he was noted to have elevated troponins and an elevated d-dimer. CTA revealed no large PE, but did note R > LLL infiltrates suggestive of a possible aspiration. His resp difficutly worsened during his ER stay, requiring use of BiPAP to maintain sats at >90%.   Patient was weaned off BiPAP to [place] He remained lethargic with poor oral intake during hospitalization.  His wife elected for having palliative care come discuss further goals of care and to assist with possibly obtaining more help in the home at time of discharge. He was evaluated by palliative care during hospitalization.  Home health and palliative care were also continued at time of discharge.

## 2021-06-13 NOTE — Evaluation (Signed)
Clinical/Bedside Swallow Evaluation Patient Details  Name: [name] Small MRN: [id number] Date of Birth: October 30, 1928  Today's Date: 06/13/2021 Time: SLP Start Time (ACUTE ONLY): 1347 SLP Stop Time (ACUTE ONLY): 1435 SLP Time Calculation (min) (ACUTE ONLY): 48 min  Past Medical History:  Past Medical History:  Diagnosis Date   (HFpEF) heart failure with preserved ejection fraction (HCC)    Arthritis    Back pain    s/p lumbar injection 2014   BENIGN PROSTATIC HYPERTROPHY 05/21/2007   Bladder cancer ([place]) 10/2011   CAD (coronary artery disease)    nonobstructive by cath 6/12:  mid LAD 30%, proximal obtuse marginal-2 30%, proximal RCA 20%, mid RCA 30-40%.  He had normal cardiac output and mildly elevated filling pressures but no significant pulmonary hypertension;   Echocardiogram in May 2012 demonstrated EF 50-55% and left atrial enlargement    Cellulitis of left foot    Hospitalized in 2006   Chronic kidney disease (CKD), stage III (moderate) (HCC) 12/04/2007   FOLLOWED BY DR PATEL   Chronic pain of lower extremity    Complication of anesthesia    1 time bladder cancer surgery- 2012 , oxygen satruratioon dropped had to stay overnight, no problems since then.   COPD (chronic obstructive pulmonary disease) ([place])    DIABETES MELLITUS, TYPE II 05/21/2007   DIVERTICULOSIS, COLON 05/21/2007   pt denies   Dyspnea    with exertion, patient mointors saturation   Fatigue AGE-RELATED   Gross hematuria    pt denies   HYPERTENSION 05/21/2007   Impaired hearing BILATERAL HEARING AIDS   On home oxygen therapy    as needed   PAD (peripheral artery disease) ([place])    a. 02/2016 L foot nonhealing ulcer-->Periph angio: L pop 100 w/ reconstitution via extensive vollats to the prox-mid peroneal (only patent vessel BK)-->PTA of L Pop and peroneal; b. Ongoing LE ischemia 5 & 06/2016 req amputation of toes on L foot.   Pneumonia    Hx  of    Psoriasis ELBOWS   Psoriasis    PSORIASIS, SCALP 10/25/2008    Past Surgical History:  Past Surgical History:  Procedure Laterality Date   AMPUTATION Left 03/27/2016   Procedure: partial first AMPUTATION RAY; LEFT;  Surgeon: Trula Slade, DPM;  Location: [place];  Service: Podiatry;  Laterality: Left;   AMPUTATION Left 06/12/2016   Procedure: TRANSMETATARSAL AMPUTATION LEFT FOOT;  Surgeon: Newt Minion, MD;  Location: [place];  Service: Orthopedics;  Laterality: Left;   APPENDECTOMY  1941   BIOPSY  05/16/2020   Procedure: BIOPSY;  Surgeon: Lavena Bullion, DO;  Location: [facility];  Service: Gastroenterology;;   CARDIAC CATHETERIZATION  04-24-11/  DR Rogue Jury ARIDA   MILD NONOBSTRUCTIVE CAD, NORMA CARDIAC OUTPUT   CARDIOVASCULAR STRESS TEST  2007   CATARACT EXTRACTION W/ INTRAOCULAR LENS  IMPLANT, BILATERAL Bilateral ~ 2010   CYSTOSCOPY  12/25/2011   Procedure: CYSTOSCOPY;  Surgeon: Molli Hazard, MD;  Location[facility];  Service: Urology;  Laterality: N/A;  needs intubation and to be paralyzed    CYSTOSCOPY W/ RETROGRADES  10/30/2011   Procedure: CYSTOSCOPY WITH RETROGRADE PYELOGRAM;  Surgeon: Molli Hazard, MD;  Location: [facility];  Service: Urology;  Laterality: Bilateral;  CYSTOSCOPY POSS TURBT BILATERAL RETROGRADE PYLEOGRAM    CYSTOSCOPY W/ RETROGRADES  11/30/2012   Procedure: CYSTOSCOPY WITH RETROGRADE PYELOGRAM;  Surgeon: Molli Hazard, MD;  Location: [facility];  Service: Urology;  Laterality: Bilateral;  Flexible cystoscopy.   CYSTOSCOPY WITH BIOPSY  11/30/2012   Procedure: CYSTOSCOPY WITH BIOPSY;  Surgeon: Molli Hazard, MD;  Location: [facility];  Service: Urology;  Laterality: N/A;   ESOPHAGOGASTRODUODENOSCOPY (EGD) WITH PROPOFOL N/A 05/16/2020   Procedure: ESOPHAGOGASTRODUODENOSCOPY (EGD) WITH PROPOFOL;  Surgeon: Lavena Bullion, DO;  [facility];  Service: Gastroenterology;  Laterality: N/A;   FEMUR IM NAIL Left 08/01/2020    Procedure: INTRAMEDULLARY (IM) RETROGRADE FEMORAL NAILING;  Surgeon: Altamese Chatom, MD;  Location: [place];  Service: Orthopedics;  Laterality: Left;   INCISION AND DRAINAGE FOOT Left ~ 2005   LEFT FOOT DUE TO INFECTION FROM  NAIL PUNCTURE INJURY   LUMBAR LAMINECTOMY/DECOMPRESSION MICRODISCECTOMY N/A 10/18/2016   Procedure: LEFT AND CENTRAL L4-5 LUMBAR LAMINECTOMY WITH RESECTION OF SYNOVIAL CYST;  Surgeon: Jessy Oto, MD;  Location: [place];  Service: Orthopedics;  Laterality: N/A;   ORIF FEMUR FRACTURE Left 04/19/2020   Procedure: OPEN REDUCTION INTERNAL FIXATION FEMORAL SHAFT FRACTURE;  Surgeon: Shona Needles, MD;  Location: [place];  Service: Orthopedics;  Laterality: Left;   PENILE PROSTHESIS IMPLANT  1990s   PERIPHERAL VASCULAR CATHETERIZATION N/A 02/14/2016   Procedure: Abdominal Aortogram w/Lower Extremity;  Surgeon: Wellington Hampshire, MD;  Location: [place] [facility];  Service: Cardiovascular;  Laterality: N/A;   PERIPHERAL VASCULAR CATHETERIZATION Left 02/28/2016   Procedure: Peripheral Vascular Balloon Angioplasty;  Surgeon: Wellington Hampshire, MD;  Location: [place] [facility];  Service: Cardiovascular;  Laterality: Left;  left peroneal and popliteal artery   TONSILLECTOMY AND ADENOIDECTOMY  1962   TOTAL HIP ARTHROPLASTY Left 12/27/2016   Procedure: LEFT TOTAL HIP ARTHROPLASTY ANTERIOR APPROACH;  Surgeon: Mcarthur Rossetti, MD;  Location: [facility];  Service: Orthopedics;  Laterality: Left;   TRANSURETHRAL RESECTION OF BLADDER TUMOR  10/30/2011   Procedure: TRANSURETHRAL RESECTION OF BLADDER TUMOR (TURBT);  Surgeon: Molli Hazard, MD;  Location: [facility];  Service: Urology;  Laterality: N/A;   TRANSURETHRAL RESECTION OF BLADDER TUMOR  12/25/2011   Procedure: TRANSURETHRAL RESECTION OF BLADDER TUMOR (TURBT);  Surgeon: Molli Hazard, MD;  Location: [facility];  Service: Urology;  Laterality: N/A;  need long gyrus instruments    VASECTOMY   1990s   HPI:  pt is a 87 yo male adm to [facility] with SOB, dyspnea - found to have right more than LLL infiltrate.  Pt PMH + fora 87yo w/ a hx of nonobstructive CAD (cath 2012), PAD, CKD stage 3, HTN, DM2, and bladder CA who presented to the ED w/ the acute onset of generalized weakness and hypoxia.  He admitted to not feeling well since at least 05/31/21 with poor intake of solids or liquids, generalized weakness, and decreased UOP (via his usual I/O caths).    Pt was noted to have elevated troponins and an elevated d-dimer per MD notes. CTA revealed no large PE, but did note R > LLL infiltrates suggestive of a possible aspiration. His resp difficutly worsened during his ER stay, requiring use of BiPAP to maintain sats at >90%.     Patient was weaned off BiPAP to [place] during hospital coarse.  Swallow evaluation ordered due to concerns for aspiration.  Pt has h/o GI bleed diagnosed after having coffee ground emesis - -  underwent endoscopy 05/2020 with findings of stomach ulcer and esophagitis.  He was placed on a PPI BID.  Pt denies dysphagia - admits to occasional "choking". Wife reports pt coughing more AFTER eating/drinking than  during.  Pt is on 2 liters of oxygen at home at night.  He resides with his wife.   Assessment / Plan / Recommendation Clinical Impression  Pt sitting upright in bed with wife and son present. CN exam is unremarkable however pt's voice is significantly dysphonic- sounding strained.  His son admits his strength of his voice was weakened since approximately one year ago.   He was observed to feed himself 1/2 sandwich, 3 crackers, 1 ounce applesauce and cup and straw boluses of thin water.  No indication of aspiration during snack intake with RR and oxygen saturation remaining stable throughout.  He did however not pass the 3 ounce [place] water test due to producing a cough within 10 seconds of completion of swallowing.     MBS indicated to assure pt is not overtly silently aspirating thin  liquids - however primary aspiration risk is from his known GERD.  Suspect pt's pharyngeal swallow is strong as no indications of pharyngeal retention present including multiple swallows, effortful swallows, etc.    Wife reports pt coughs more AFTER eating and is noted to belch frequently when ambulating to his room when ready for bed - stating this belching is new of recent. SLP questions if he may have some "silent reflux" with this eructation.  Pt denies having any reflux symptoms at all but SLP recommended pt remain upright after meals.  Discussed with pt and family findings of evaluation, recommendation for instrumental swallow evaluation to rule out overt thin aspiration and indication for GERD precautions.      Further informed family/pt nof other risk factors for aspiration pneumonias including immune status, oral health, ambulatory and baseline pulmonary status.    All in agreement to proceed with MBS 06/14/2021, recommend continue heart healthy/regular consistency diet pending this test. SLP Visit Diagnosis: Dysphagia, unspecified (R13.10)    Aspiration Risk  Moderate aspiration risk    Diet Recommendation Regular;Thin liquid   Liquid Administration via: Cup;Straw Medication Administration: Whole meds with liquid Supervision: Patient able to self feed Compensations: Slow rate;Small sips/bites Postural Changes: Seated upright at 90 degrees;Remain upright for at least 30 minutes after po intake    Other  Recommendations Oral Care Recommendations: Oral care BID   Follow up Recommendations None      Frequency and Duration min 1 x/week  1 week       Prognosis Prognosis for Safe Diet Advancement: Good      Swallow Study   General HPI: pt is a 87 yo male adm to [facility] with SOB, dyspnea - found to have right more than LLL infiltrate.  Pt PMH + fora 87yo w/ a hx of nonobstructive CAD (cath 2012), PAD, CKD stage 3, HTN, DM2, and bladder CA who presented to the ED w/ the acute onset of  generalized weakness and hypoxia.  He admitted to not feeling well since at least 05/31/21 with poor intake of solids or liquids, generalized weakness, and decreased UOP (via his usual I/O caths).    Pt was noted to have elevated troponins and an elevated d-dimer per MD notes. CTA revealed no large PE, but did note R > LLL infiltrates suggestive of a possible aspiration. His resp difficutly worsened during his ER stay, requiring use of BiPAP to maintain sats at >90%.     Patient was weaned off BiPAP to [place] during hospital coarse.  Swallow evaluation ordered due to concerns for aspiration.  Pt has h/o GI bleed diagnosed after having coffee ground emesis - -  underwent endoscopy 05/2020 with findings of stomach ulcer and esophagitis.  He was placed on a PPI BID.  Pt denies dysphagia - admits to occasional "choking". Wife reports pt coughing more AFTER eating/drinking than during.  Pt is on 2 liters of oxygen at home at night.  He resides with his wife. Type of Study: Bedside Swallow Evaluation Previous Swallow Assessment: endoscopy 05/2020 Diet Prior to this Study: Regular;Thin liquids Temperature Spikes Noted: No Respiratory Status: Nasal cannula (5 liters) History of Recent Intubation: No Behavior/Cognition: Alert;Cooperative;Pleasant mood Oral Cavity Assessment: Within Functional Limits Oral Care Completed by SLP: No Oral Cavity - Dentition: Dentures, top;Dentures, bottom Vision: Functional for self-feeding Self-Feeding Abilities: Able to feed self Patient Positioning: Upright in bed Baseline Vocal Quality: Suspected CN X (Vagus) involvement;Other (comment) (pt dysphonic, concerning for vocal cord integrity) Volitional Cough: Strong Volitional Swallow: Able to elicit    Oral/Motor/Sensory Function Overall Oral Motor/Sensory Function: Within functional limits   Ice Chips Ice chips: Not tested   Thin Liquid Thin Liquid: Impaired Presentation: Self Fed;Straw;Cup Pharyngeal  Phase Impairments: Cough -  Delayed Other Comments: pt failed the 3 ounce Yale water challenge - due to cough within 10 seconds of swallowing; small single sips were tolerated without indication of aspiration and swallow clinically appeared timely    Nectar Thick Nectar Thick Liquid: Not tested   Honey Thick Honey Thick Liquid: Not tested   Puree Puree: Within functional limits Presentation: Self Fed;Spoon   Solid     Solid: Within functional limits Presentation: Self Fredirick Lathe 06/13/2021,3:17 PM  Kathleen Lime, MS [facility] SLP [place] Office [phone] Pager [phone]

## 2021-06-13 NOTE — Assessment & Plan Note (Addendum)
-  BNP 304.5 -Echo EF 55-60%, grade 2 dd -Outpatient follow-up with cardiology

## 2021-06-13 NOTE — Assessment & Plan Note (Addendum)
-  Likely demand ischemia -IV heparin discontinued

## 2021-06-13 NOTE — Assessment & Plan Note (Signed)
-  Ha1c 8.2 -Continue sliding scale insulin

## 2021-06-14 ENCOUNTER — Inpatient Hospital Stay (HOSPITAL_COMMUNITY): Payer: Medicare Other

## 2021-06-14 DIAGNOSIS — Z515 Encounter for palliative care: Secondary | ICD-10-CM

## 2021-06-14 DIAGNOSIS — R531 Weakness: Secondary | ICD-10-CM

## 2021-06-14 DIAGNOSIS — Z7189 Other specified counseling: Secondary | ICD-10-CM

## 2021-06-14 LAB — CBC WITH DIFFERENTIAL/PLATELET
Abs Immature Granulocytes: 0.04 10*3/uL (ref 0.00–0.07)
Basophils Absolute: 0.1 10*3/uL (ref 0.0–0.1)
Basophils Relative: 1 %
Eosinophils Absolute: 0.2 10*3/uL (ref 0.0–0.5)
Eosinophils Relative: 2 %
HCT: 48.6 % (ref 39.0–52.0)
Hemoglobin: 15.1 g/dL (ref 13.0–17.0)
Immature Granulocytes: 1 %
Lymphocytes Relative: 21 %
Lymphs Abs: 1.4 10*3/uL (ref 0.7–4.0)
MCH: 32.6 pg (ref 26.0–34.0)
MCHC: 31.1 g/dL (ref 30.0–36.0)
MCV: 105 fL — ABNORMAL HIGH (ref 80.0–100.0)
Monocytes Absolute: 0.7 10*3/uL (ref 0.1–1.0)
Monocytes Relative: 11 %
Neutro Abs: 4.3 10*3/uL (ref 1.7–7.7)
Neutrophils Relative %: 64 %
Platelets: 171 10*3/uL (ref 150–400)
RBC: 4.63 MIL/uL (ref 4.22–5.81)
RDW: 13.4 % (ref 11.5–15.5)
WBC: 6.6 10*3/uL (ref 4.0–10.5)
nRBC: 0 % (ref 0.0–0.2)

## 2021-06-14 LAB — BASIC METABOLIC PANEL
Anion gap: 6 (ref 5–15)
BUN: 33 mg/dL — ABNORMAL HIGH (ref 8–23)
CO2: 43 mmol/L — ABNORMAL HIGH (ref 22–32)
Calcium: 8.6 mg/dL — ABNORMAL LOW (ref 8.9–10.3)
Chloride: 88 mmol/L — ABNORMAL LOW (ref 98–111)
Creatinine, Ser: 1.54 mg/dL — ABNORMAL HIGH (ref 0.61–1.24)
GFR, Estimated: 43 mL/min — ABNORMAL LOW (ref >60)
Glucose, Bld: 135 mg/dL — ABNORMAL HIGH (ref 70–99)
Potassium: 4.4 mmol/L (ref 3.5–5.1)
Sodium: 137 mmol/L (ref 135–145)

## 2021-06-14 LAB — MRSA NEXT GEN BY PCR, NASAL: MRSA by PCR Next Gen: NOT DETECTED

## 2021-06-14 LAB — GLUCOSE, CAPILLARY
Glucose-Capillary: 137 mg/dL — ABNORMAL HIGH (ref 70–99)
Glucose-Capillary: 267 mg/dL — ABNORMAL HIGH (ref 70–99)
Glucose-Capillary: 319 mg/dL — ABNORMAL HIGH (ref 70–99)
Glucose-Capillary: 152 mg/dL — ABNORMAL HIGH (ref 70–99)

## 2021-06-14 LAB — MAGNESIUM: Magnesium: 2 mg/dL (ref 1.7–2.4)

## 2021-06-14 MED ORDER — TRAZODONE HCL 50 MG PO TABS
25.0000 mg | ORAL_TABLET | Freq: Every day | ORAL | Status: AC
  Administered 2021-06-14: 25 mg via ORAL
  Filled 2021-06-14: qty 1

## 2021-06-14 NOTE — Progress Notes (Signed)
Progress Note    [name] Small   HMC:[id number]  DOB: 09/27/1928  DOA: 06/11/2021     3  PCP: Tonia Ghent, MD  CC: Weakness  Hospital Course: [name] Small is a 87yo w/ a hx of nonobstructive CAD (cath 2012), PAD, CKD stage 3, HTN, DM2, and bladder CA who presented to the ED w/ the acute onset of generalized weakness and hypoxia. He requires nocturnal 2L [place] O2 support at baseline, but noted sats in the 80s the morning of admission. He was feeling fatigued in association with this finding. He admitted to not feeling well since at least 05/31/21 with poor intake of solids or liquids, generalized weakness, and decreased UOP (via his usual I/O caths).   In the ER he was noted to have elevated troponins and an elevated d-dimer. CTA revealed no large PE, but did note R > LLL infiltrates suggestive of a possible aspiration. His resp difficutly worsened during his ER stay, requiring use of BiPAP to maintain sats at >90%.   Patient was weaned off BiPAP to [place] He remained lethargic with poor oral intake during hospitalization.  His wife elected for having palliative care come discuss further goals of care and to assist with possibly obtaining more help in the home at time of discharge.  Interval History:  No events overnight.  Son present bedside this morning.  Patient is more awake and alert today.  Undergoing modified barium swallow later today.  He is motivated for having physical therapy come to the house when we discussed home health.  ROS: Constitutional: positive for fatigue and malaise, Respiratory: negative for cough, Cardiovascular: negative for chest pain, and Gastrointestinal: negative for abdominal pain  Assessment & Plan: * Acute respiratory failure with hypoxia and hypercapnia (HCC) -Suspected combination of aspiration pneumonitis, diastolic heart failure -ABG 7.297 / 78  -Off BiPAP -Continue to wean oxygen to baseline use of 2 L   Aspiration pneumonia ([place]) -Hold off  on antibiotics  Goals of care, counseling/discussion - Palliative care consulted per family request - Patient has been less mobile at home recently but appears to be motivated some for having physical therapy come to the house.  Wife is against patient going to rehab due to prior bad experience  Chronic cystitis - on chronic doxycycline   Chronic kidney disease, stage 3b (HCC) -Baseline Cr ~1.8-2.3  -Stable  Demand ischemia ([place]) -Likely demand ischemia -Cardiology following -IV heparin discontinued  Hyperlipidemia - Continue Lipitor  Acute on chronic diastolic CHF (congestive heart failure) (HCC) -BNP 304.5 -Echo EF 55-60%, grade 2 dd -Cardiology following - lasix per cardiology   DMII (diabetes mellitus, type 2) ([place]) -Ha1c 8.2 -Continue sliding scale insulin   Old records reviewed in assessment of this patient  Antimicrobials: Chronic doxycycline  DVT prophylaxis: heparin injection 5,000 Units Start: 06/12/21 1400   Code Status:   Code Status: DNR Family Communication: Wife  Disposition Plan: Status is: Inpatient  Remains inpatient appropriate because:Unsafe d/c plan and Inpatient level of care appropriate due to severity of illness  Dispo: The patient is from: Home              Anticipated d/c is to: Home              Patient currently is not medically stable to d/c.   Difficult to place patient No  Risk of unplanned readmission score: Unplanned Admission- Pilot do not use: 18.74   Objective: Blood pressure (!) 117/52, pulse 66, temperature 97.8 F (  36.6 C), temperature source Oral, resp. rate 15, height 6\' 2"  (1.88 m), weight 95.3 kg, SpO2 94 %.  Examination: General appearance: cooperative, fatigued, and no distress Head: Normocephalic, without obvious abnormality, atraumatic Eyes:  EOMI Lungs:  Subtle crackles otherwise relatively clear Heart: regular rate and rhythm and S1, S2 normal Abdomen: normal findings: bowel sounds normal and soft,  non-tender Extremities:  No edema Skin: mobility and turgor normal Neurologic: Grossly normal  Consultants:  Cardiology Palliative care  Procedures:    Data Reviewed: I have personally reviewed following labs and imaging studies Results for orders placed or performed during the hospital encounter of 06/11/21 (from the past 24 hour(s))  Glucose, capillary     Status: Abnormal   Collection Time: 06/13/21  5:37 PM  Result Value Ref Range   Glucose-Capillary 235 (H) 70 - 99 mg/dL   Comment 1 Notify RN    Comment 2 Document in Chart   Glucose, capillary     Status: Abnormal   Collection Time: 06/13/21  9:55 PM  Result Value Ref Range   Glucose-Capillary 202 (H) 70 - 99 mg/dL   Comment 1 Notify RN    Comment 2 Document in Chart   Glucose, capillary     Status: Abnormal   Collection Time: 06/14/21  7:36 AM  Result Value Ref Range   Glucose-Capillary 137 (H) 70 - 99 mg/dL  Basic metabolic panel     Status: Abnormal   Collection Time: 06/14/21  8:52 AM  Result Value Ref Range   Sodium 137 135 - 145 mmol/L   Potassium 4.4 3.5 - 5.1 mmol/L   Chloride 88 (L) 98 - 111 mmol/L   CO2 43 (H) 22 - 32 mmol/L   Glucose, Bld 135 (H) 70 - 99 mg/dL   BUN 33 (H) 8 - 23 mg/dL   Creatinine, Ser 1.54 (H) 0.61 - 1.24 mg/dL   Calcium 8.6 (L) 8.9 - 10.3 mg/dL   GFR, Estimated 43 (L) >60 mL/min   Anion gap 6 5 - 15  CBC with Differential/Platelet     Status: Abnormal   Collection Time: 06/14/21  8:52 AM  Result Value Ref Range   WBC 6.6 4.0 - 10.5 K/uL   RBC 4.63 4.22 - 5.81 MIL/uL   Hemoglobin 15.1 13.0 - 17.0 g/dL   HCT 48.6 39.0 - 52.0 %   MCV 105.0 (H) 80.0 - 100.0 fL   MCH 32.6 26.0 - 34.0 pg   MCHC 31.1 30.0 - 36.0 g/dL   RDW 13.4 11.5 - 15.5 %   Platelets 171 150 - 400 K/uL   nRBC 0.0 0.0 - 0.2 %   Neutrophils Relative % 64 %   Neutro Abs 4.3 1.7 - 7.7 K/uL   Lymphocytes Relative 21 %   Lymphs Abs 1.4 0.7 - 4.0 K/uL   Monocytes Relative 11 %   Monocytes Absolute 0.7 0.1 - 1.0  K/uL   Eosinophils Relative 2 %   Eosinophils Absolute 0.2 0.0 - 0.5 K/uL   Basophils Relative 1 %   Basophils Absolute 0.1 0.0 - 0.1 K/uL   Immature Granulocytes 1 %   Abs Immature Granulocytes 0.04 0.00 - 0.07 K/uL  Magnesium     Status: None   Collection Time: 06/14/21  8:52 AM  Result Value Ref Range   Magnesium 2.0 1.7 - 2.4 mg/dL  Glucose, capillary     Status: Abnormal   Collection Time: 06/14/21 11:57 AM  Result Value Ref Range   Glucose-Capillary 267 (H)  70 - 99 mg/dL    Recent Results (from the past 240 hour(s))  Resp Panel by RT-PCR (Flu A&B, Covid) Nasopharyngeal Swab     Status: None   Collection Time: 06/11/21  8:48 AM   Specimen: Nasopharyngeal Swab; Nasopharyngeal(NP) swabs in vial transport medium  Result Value Ref Range Status   SARS Coronavirus 2 by RT PCR NEGATIVE NEGATIVE Final    Comment: (NOTE) SARS-CoV-2 target nucleic acids are NOT DETECTED.  The SARS-CoV-2 RNA is generally detectable in upper respiratory specimens during the acute phase of infection. The lowest concentration of SARS-CoV-2 viral copies this assay can detect is 138 copies/mL. A negative result does not preclude SARS-Cov-2 infection and should not be used as the sole basis for treatment or other patient management decisions. A negative result may occur with  improper specimen collection/handling, submission of specimen other than nasopharyngeal swab, presence of viral mutation(s) within the areas targeted by this assay, and inadequate number of viral copies(<138 copies/mL). A negative result must be combined with clinical observations, patient history, and epidemiological information. The expected result is Negative.  Fact Sheet for Patients:  EntrepreneurPulse.com.au  Fact Sheet for Healthcare Providers:  IncredibleEmployment.be  This test is no t yet approved or cleared by the [place] FDA and  has been authorized for detection and/or  diagnosis of SARS-CoV-2 by FDA under an Emergency Use Authorization (EUA). This EUA will remain  in effect (meaning this test can be used) for the duration of the COVID-19 declaration under Section 564(b)(1) of the Act, 21 U.S.C.section 360bbb-3(b)(1), unless the authorization is terminated  or revoked sooner.       Influenza A by PCR NEGATIVE NEGATIVE Final   Influenza B by PCR NEGATIVE NEGATIVE Final    Comment: (NOTE) The Xpert Xpress SARS-CoV-2/FLU/RSV plus assay is intended as an aid in the diagnosis of influenza from Nasopharyngeal swab specimens and should not be used as a sole basis for treatment. Nasal washings and aspirates are unacceptable for Xpert Xpress SARS-CoV-2/FLU/RSV testing.  Fact Sheet for Patients: EntrepreneurPulse.com.au  Fact Sheet for Healthcare Providers: IncredibleEmployment.be  This test is not yet approved or cleared by the [place] FDA and has been authorized for detection and/or diagnosis of SARS-CoV-2 by FDA under an Emergency Use Authorization (EUA). This EUA will remain in effect (meaning this test can be used) for the duration of the COVID-19 declaration under Section 564(b)(1) of the Act, 21 U.S.C. section 360bbb-3(b)(1), unless the authorization is terminated or revoked.  Performed at [facility], [place] [address] [place], [place] [address]      Radiology Studies: No results found. VAS [place] LOWER EXTREMITY VENOUS (DVT)  Final Result    CT Angio Chest PE W and/or Wo Contrast  Final Result    DG Chest Port 1 View  Final Result    CT Head Wo Contrast  Final Result    DG Swallowing Func-Speech Pathology    (Results Pending)    Scheduled Meds:  Apremilast  30 mg Oral Daily   atorvastatin  10 mg Oral Daily   chlorhexidine  15 mL Mouth Rinse BID   Chlorhexidine Gluconate Cloth  6 each Topical Q0600   docusate sodium  100 mg Oral BID   doxycycline  50 mg Oral Daily    finasteride  5 mg Oral Daily   heparin  5,000 Units Subcutaneous Q8H   insulin aspart  0-5 Units Subcutaneous QHS   insulin aspart  0-9 Units Subcutaneous TID WC   mouth  rinse  15 mL Mouth Rinse q12n4p   pantoprazole  80 mg Oral Daily   tamsulosin  0.8 mg Oral Daily   umeclidinium bromide  1 puff Inhalation Daily   PRN Meds: acetaminophen **OR** acetaminophen, ondansetron **OR** ondansetron (ZOFRAN) IV, traMADol Continuous Infusions:   LOS: 3 days  Time spent: Greater than 50% of the 35 minute visit was spent in counseling/coordination of care for the patient as laid out in the A&P.   Dwyane Dee, MD [facility] 06/14/2021, 12:55 PM

## 2021-06-14 NOTE — Assessment & Plan Note (Addendum)
-   Palliative care consulted per family request - Patient has been less mobile at home recently but appears to be motivated some for having physical therapy come to the house.  Wife is against patient going to rehab due to prior bad experience - max HH to be ordered in anticipation of d/c home

## 2021-06-14 NOTE — Consult Note (Signed)
Consultation Note Date: 06/14/2021   Patient Name: [name] Small  DOB: 1928/10/29  MRN: [id number]  Age / Sex: 87 y.o., male  PCP: Tonia Ghent, MD Referring Physician: Dwyane Dee, MD  Reason for Consultation: Establishing goals of care  HPI/Patient Profile: 87 y.o. male    admitted on 06/11/2021    Clinical Assessment and Goals of Care: 87 year old gentleman who lives at home with his wife.  Patient has a history of coronary artery disease, peripheral arterial disease, stage III chronic kidney disease hypertension diabetes bladder cancer.  Patient presented to the hospital with acute generalized weakness and hypoxia.  Patient has been admitted for aspiration pneumonia.  CT scan of the chest revealed right more than left infiltrates suggestive of possible aspiration etiology.  Earlier in this hospitalization patient did not require use of BiPAP.  Currently on 6 L oxygen via nasal cannula.  Speech therapist are following.  Patient is scheduled to undergo modified barium swallow study today.  Patient has advance care planning documents in the [facility] app and these have been reviewed.  This included DO NOT RESUSCITATE form as well as a healthcare power of attorney form designating his wife [name] as his healthcare power of attorney agent.  Palliative medicine consultation for additional goals of care discussions has been requested.  Patient is sitting up in chair.  He has to clear his throat and cough after taking in anything p.o.  His son who is visiting from [place] is present at the bedside.  I introduced myself and palliative care as follows:  Palliative medicine is specialized medical care for people living with serious illness. It focuses on providing relief from the symptoms and stress of a serious illness. The goal is to improve quality of life for both the patient and the family. Goals of care: Broad aims  of medical therapy in relation to the patient's values and preferences. Our aim is to provide medical care aimed at enabling patients to achieve the goals that matter most to them, given the circumstances of their particular medical situation and their constraints.   Brief life review performed.  Goals wishes and values important to the patient and family as a unit attempted to be explored.  Their purpose for asking for palliative consultation was to see if the patient can receive services such as physical therapy occupational therapy and a home based nursing support.  Patient does not want to attempt another skilled nursing facility rehabilitation attempt.  He was recently at [facility] a few months ago after reportedly having left hip fracture.  NEXT OF KIN Wife, has a son who lives in [place] who is currently at the bedside visiting.  SUMMARY OF RECOMMENDATIONS   Agree with DO NOT RESUSCITATE. Continue current mode of care. Agree with modified barium swallow study today, follow recommendations. Patient and family request for home-based physical therapy, home nursing and home-based speech therapy follow-up.  They do not want the patient to go back to skilled nursing facility for rehabilitation attempt.  Will  also recommend addition of palliative services at home.  No additional palliative medicine team specific interventions or recommendations at this time other than as noted above.  Thank you for the consult. Code Status/Advance Care Planning: DNR   Symptom Management:     Palliative Prophylaxis:  Delirium Protocol    Psycho-social/Spiritual:  Desire for further Chaplaincy support:yes Additional Recommendations: Caregiving  Support/Resources  Prognosis:  Unable to determine  Discharge Planning: Home with Palliative Services      Primary Diagnoses: Present on Admission:  Acute respiratory failure with hypoxia and hypercapnia (HCC)  Acute on chronic diastolic CHF  (congestive heart failure) (HCC)  Hyperlipidemia  BPH (benign prostatic hyperplasia)  PSORIASIS, SCALP   I have reviewed the medical record, interviewed the patient and family, and examined the patient. The following aspects are pertinent.  Past Medical History:  Diagnosis Date   (HFpEF) heart failure with preserved ejection fraction (HCC)    Arthritis    Back pain    s/p lumbar injection 2014   BENIGN PROSTATIC HYPERTROPHY 05/21/2007   Bladder cancer ([place]) 10/2011   CAD (coronary artery disease)    nonobstructive by cath 6/12:  mid LAD 30%, proximal obtuse marginal-2 30%, proximal RCA 20%, mid RCA 30-40%.  He had normal cardiac output and mildly elevated filling pressures but no significant pulmonary hypertension;   Echocardiogram in May 2012 demonstrated EF 50-55% and left atrial enlargement    Cellulitis of left foot    Hospitalized in 2006   Chronic kidney disease (CKD), stage III (moderate) (HCC) 12/04/2007   FOLLOWED BY DR PATEL   Chronic pain of lower extremity    Complication of anesthesia    1 time bladder cancer surgery- 2012 , oxygen satruratioon dropped had to stay overnight, no problems since then.   COPD (chronic obstructive pulmonary disease) ([place])    DIABETES MELLITUS, TYPE II 05/21/2007   DIVERTICULOSIS, COLON 05/21/2007   pt denies   Dyspnea    with exertion, patient mointors saturation   Fatigue AGE-RELATED   Gross hematuria    pt denies   HYPERTENSION 05/21/2007   Impaired hearing BILATERAL HEARING AIDS   On home oxygen therapy    as needed   PAD (peripheral artery disease) ([place])    a. 02/2016 L foot nonhealing ulcer-->Periph angio: L pop 100 w/ reconstitution via extensive vollats to the prox-mid peroneal (only patent vessel BK)-->PTA of L Pop and peroneal; b. Ongoing LE ischemia 5 & 06/2016 req amputation of toes on L foot.   Pneumonia    Hx  of    Psoriasis ELBOWS   Psoriasis    PSORIASIS, SCALP 10/25/2008   Social History   Socioeconomic History    Marital status: Married    Spouse name: Not on file   Number of children: 4   Years of education: Not on file   Highest education level: Not on file  Occupational History   Occupation: retired     Comment: [employer]	[place].  Tobacco Use   Smoking status: Former    Packs/day: 2.00    Years: 22.00    Pack years: 44.00    Types: Cigarettes    Quit date: 11/11/1968    Years since quitting: 52.6   Smokeless tobacco: Never  Vaping Use   Vaping Use: Never used  Substance and Sexual Activity   Alcohol use: Yes    Comment: occassional- 10/17/16- none  in 4 months- "too sick"   Drug use: No   Sexual activity: Not Currently  Other Topics Concern   Not on file  Social History Narrative   Retired Event organiser.   Active with golf.   [place] vet.  [place] [phone].     Social Determinants of Health   Financial Resource Strain: Low Risk    Difficulty of Paying Living Expenses: Not very hard  Food Insecurity: Not on file  Transportation Needs: Not on file  Physical Activity: Not on file  Stress: Not on file  Social Connections: Not on file   Family History  Problem Relation Age of Onset   Diabetes Mother    Heart disease Brother    Heart disease Brother    Heart disease Brother    Heart disease Brother    Heart disease Brother    Heart disease Brother    Lung cancer Brother    Scheduled Meds:  Apremilast  30 mg Oral Daily   atorvastatin  10 mg Oral Daily   chlorhexidine  15 mL Mouth Rinse BID   Chlorhexidine Gluconate Cloth  6 each Topical Q0600   docusate sodium  100 mg Oral BID   doxycycline  50 mg Oral Daily   finasteride  5 mg Oral Daily   heparin  5,000 Units Subcutaneous Q8H   insulin aspart  0-5 Units Subcutaneous QHS   insulin aspart  0-9 Units Subcutaneous TID WC   mouth rinse  15 mL Mouth Rinse q12n4p   pantoprazole  80 mg Oral Daily   tamsulosin  0.8 mg Oral Daily   umeclidinium bromide  1 puff Inhalation Daily   Continuous Infusions: PRN  Meds:.acetaminophen **OR** acetaminophen, ondansetron **OR** ondansetron (ZOFRAN) IV, traMADol Medications Prior to Admission:  Prior to Admission medications   Medication Sig Start Date End Date Taking? Authorizing Provider  acetaminophen (TYLENOL) 325 MG tablet Take 1-2 tablets (325-650 mg total) by mouth every 4 (four) hours as needed for mild pain. 05/11/20  Yes Angiulli, Lavon Paganini, PA-C  Apremilast 30 MG TABS Take 1 tablet by mouth daily. Patient taking differently: Take 30 mg by mouth daily. 03/30/19  Yes Tonia Ghent, MD  atorvastatin (LIPITOR) 10 MG tablet Take 1 tablet (10 mg total) by mouth daily. 05/11/20  Yes Angiulli, Lavon Paganini, PA-C  Cholecalciferol (VITAMIN D) 125 MCG (5000 UT) CAPS Take 1 capsule by mouth daily. Patient taking differently: Take 5,000 Units by mouth daily. 08/08/20  Yes Ainsley Spinner, PA-C  clobetasol (TEMOVATE) 0.05 % external solution Apply 1 application topically daily as needed (rash).    Yes [provider]  CRANBERRY PO Take 4 capsules by mouth daily. Takes AZO 2 tabs BID   Yes [provider]  docusate sodium (COLACE) 100 MG capsule Take 100 mg by mouth daily as needed for mild constipation.   Yes [provider]  doxycycline (ADOXA) 50 MG tablet Take 50 mg by mouth daily.   Yes [provider]  finasteride (PROSCAR) 5 MG tablet Take 5 mg by mouth daily.   Yes [provider]  fluticasone-salmeterol (ADVAIR) 250-50 MCG/ACT AEPB Take 1 puff by mouth 2 (two) times daily as needed (shortness of breath). 01/16/21  Yes [provider]  insulin glargine (LANTUS) 100 UNIT/ML Solostar Pen Inject 15 Units into the skin daily. 03/23/21  Yes Tonia Ghent, MD  Multiple Vitamin (MULTIVITAMIN WITH MINERALS) TABS tablet Take 1 tablet by mouth daily. 05/11/20  Yes Angiulli, Lavon Paganini, PA-C  omeprazole (PRILOSEC) 40 MG capsule TAKE 1 CAPSULE (40 MG TOTAL) BY MOUTH DAILY. 12/27/20  Yes  Tonia Ghent, MD  tamsulosin (FLOMAX) 0.4  MG CAPS capsule TAKE 2 CAPSULES BY MOUTH DAILY AFTER SUPPER. Patient taking differently: Take 0.8 mg by mouth daily. 05/30/21  Yes Tonia Ghent, MD  tiotropium (SPIRIVA) 18 MCG inhalation capsule Place 18 mcg into inhaler and inhale daily.   Yes [provider]  traMADol (ULTRAM) 50 MG tablet Take 1 tablet (50 mg total) by mouth every 8 (eight) hours as needed for moderate pain or severe pain. 08/08/20  Yes Ainsley Spinner, PA-C  traZODone (DESYREL) 50 MG tablet Take 0.5 tablets (25 mg total) by mouth at bedtime as needed for sleep. 09/05/20  Yes Tonia Ghent, MD  calcium carbonate (OS-CAL - DOSED IN MG OF ELEMENTAL CALCIUM) 1250 (500 Ca) MG tablet Take 1 tablet (500 mg of elemental calcium total) by mouth daily with breakfast. Patient not taking: No sig reported 05/11/20 06/11/21  Angiulli, Lavon Paganini, PA-C  escitalopram (LEXAPRO) 5 MG tablet Take 1 tablet (5 mg total) by mouth daily. Patient not taking: No sig reported 05/11/20 06/11/21  Angiulli, Lavon Paganini, PA-C   Allergies  Allergen Reactions   Actos [Pioglitazone Hydrochloride] Other (See Comments)    Held 2012 bladder cancer   Aspirin Other (See Comments)    Held 2012 due to hematuria and bruising.    Ace Inhibitors Cough   Bactrim Rash and Other (See Comments)    Rash, presumed allergy   Gabapentin Nausea And Vomiting    Upset stomach and diarrhea - tolerates low doses   Lyrica [Pregabalin] Swelling    swelling   Pravastatin Swelling    Swelling of feet   Sulfa Drugs Cross Reactors Rash   Review of Systems Coughs after eating, denies pain Physical Exam Elderly appearing gentleman sitting up in a chair, coughs and clears his throat after taking sips/bites. Regular work of breathing Monitor noted No significant edema Answers questions appropriately No distress  Vital Signs: BP (!) 133/54 (BP Location: Left Arm)   Pulse 67   Temp 98.2 F (36.8 C) (Axillary)   Resp (!) 21   Ht 6\' 2"  (1.88 m)   Wt 95.3 kg   SpO2 91%    BMI 26.96 kg/m  Pain Scale: 0-10   Pain Score: 0-No pain   SpO2: SpO2: 91 % O2 Device:SpO2: 91 % O2 Flow Rate: .O2 Flow Rate (L/min): 4 L/min  IO: Intake/output summary:  Intake/Output Summary (Last 24 hours) at 06/14/2021 1158 Last data filed at 06/14/2021 6387 Gross per 24 hour  Intake 120 ml  Output 725 ml  Net -605 ml    LBM: Last BM Date: 06/14/21 Baseline Weight: Weight: 95.3 kg Most recent weight: Weight: 95.3 kg     Palliative Assessment/Data:     Palliative performance scale 50%.  Time In: 10 Time Out: 11 Time Total: 60 Greater than 50%  of this time was spent counseling and coordinating care related to the above assessment and plan.  Signed by: Loistine Chance, MD   Please contact Palliative Medicine Team phone at [phone] for questions and concerns.  For individual provider: See Shea Evans

## 2021-06-14 NOTE — Progress Notes (Signed)
Occupational Therapy Evaluation  Patient lives with spouse in a single level house with ramp entry. At baseline spouse will assist with lower body ADLs, catheter management 3x/day but cannot provide physical assistance for standing. Patient mod I with walker, will sometimes use wheelchair out on back deck but per son does not like the wheelchair would rather use his walker. Currently patient presents with generalized weakness and decreased balance + activity tolerance needing min A x2 with walker to safely stand pivot transfer to recliner. Patient with L foot transmet amputation, has limited control when stepping down with L LE almost slamming into floor. Does have shoe with orthotic, asked if spouse could bring in. Recommend continued acute OT services to maximize patient safety and independence with functional mobility, transfers and ADLs in order to return home with spouse and [facility] therapy.     06/14/21 1400  OT Visit Information  Last OT Received On 06/14/21  Assistance Needed +2  PT/OT/SLP Co-Evaluation/Treatment Yes  Reason for Co-Treatment For patient/therapist safety;To address functional/ADL transfers;Complexity of the patient's impairments (multi-system involvement)  PT goals addressed during session Mobility/safety with mobility  OT goals addressed during session ADL's and self-care  History of Present Illness [name] Small is a 87yo w/ a hx of nonobstructive CAD (cath 2012), PAD, CKD stage 3, HTN, DM2, and bladder CA who presented to the ED 06/11/21 w/ the acute onset of generalized weakness and hypoxia,CTA revealed no large PE, but did note R > LLL infiltrates suggestive of a possible aspiration. His resp difficutly worsened during his ER stay, requiring use of BiPAP to maintain sats at >90%.  Precautions  Precautions Fall  Precaution Comments monitor sats, L trans met amputation. wife to bring in shoes although patient reports  that he likes the socks  [place]  expects to be discharged to: Private residence  Living Arrangements Spouse/significant other  Available Help at Discharge Family;Available 24 hours/day  Type of [place] One level  Bathroom Shower/Tub Walk-in shower  [place] held shower head;Grab bars - tub/shower;Cane - single point;Wheelchair - manual;Other (comment) (lift bed and "standing" walker)  Additional Comments sleeps in lift chair  Prior Function  Level of Independence Needs assistance  Gait / Transfers Assistance Needed patient prefers walker, will sit in wheelchair on back deck  ADL's / [place] his spouse caths patient 3x/day, does not have physical strength to assist patient however does assist with donning lower body clothing  Communication  Communication HOH  Pain Assessment  Pain Assessment No/denies pain  Cognition  Arousal/Alertness Awake/alert  Behavior During Therapy WFL for tasks assessed/performed  Overall Cognitive Status Within Functional Limits for tasks assessed  General Comments following directions appropriately  Upper Extremity Assessment  Upper Extremity Assessment Generalized weakness  Lower Extremity Assessment  Lower Extremity Assessment Defer to PT evaluation  Cervical / Trunk Assessment  Cervical / Trunk Assessment Normal  ADL  Overall ADL's  Needs assistance/impaired  Grooming Set up;Sitting  Upper Body Bathing Set up;Sitting  Lower Body Bathing Moderate assistance;Sitting/lateral leans;Sit to/from stand  Upper Body Dressing  Set up;Sitting  Lower Body Dressing Total assistance;Bed level  Lower Body Dressing Details (indicate cue type and reason) to don socks, has assist from spouse for LB dressing at baseline  Toilet Transfer Minimal assistance;+2 for physical assistance;+2 for safety/equipment;Cueing for safety;Cueing for sequencing;RW;Stand-pivot  Toilet  Transfer Details (indicate cue type and reason)  to recliner, min A x2 to power up to standing, difficulty controlling descent of L LE while taking few steps to recliner needing min x2 for safety  Toileting- Clothing Manipulation and Hygiene Maximal assistance;Sitting/lateral lean;Sit to/from stand  Functional mobility during ADLs Minimal assistance;+2 for physical assistance;+2 for safety/equipment;Cueing for safety;Cueing for sequencing;Rolling walker  Bed Mobility  Overal bed mobility Needs Assistance  Bed Mobility Rolling;Sidelying to Sit  Rolling Min assist  Sidelying to sit Min assist  General bed mobility comments use of bed rail, min A for safety  Transfers  Overall transfer level Needs assistance  Equipment used Rolling walker (2 wheeled)  Transfers Sit to/from Stand;Stand Pivot Transfers  Sit to Stand +2 safety/equipment;From elevated surface;Min assist  Stand pivot transfers +2 safety/equipment;Min assist  General transfer comment please see toilet transfer in ADL section  Balance  Overall balance assessment Needs assistance  Sitting-balance support Feet supported  Sitting balance-Leahy Scale Good  Standing balance support Bilateral upper extremity supported;During functional activity  Standing balance-Leahy Scale Poor  Standing balance comment reliant on UE support  OT - End of Session  Equipment Utilized During Treatment Rolling walker;Gait belt  Activity Tolerance Patient tolerated treatment well  Patient left in chair;with call bell/phone within reach;with chair alarm set;with family/visitor present  Nurse Communication Mobility status  OT Assessment  OT Recommendation/Assessment Patient needs continued OT Services  OT Visit Diagnosis Unsteadiness on feet (R26.81);Other abnormalities of gait and mobility (R26.89);Muscle weakness (generalized) (M62.81)  OT Problem List Decreased strength;Decreased activity tolerance;Impaired balance (sitting and/or standing)  OT Plan  OT  Frequency (ACUTE ONLY) Min 2X/week  OT Treatment/Interventions (ACUTE ONLY) Self-care/ADL training;Therapeutic exercise;Therapeutic activities;Patient/family education;Balance training  AM-PAC OT "6 Clicks" Daily Activity Outcome Measure (Version 2)  Help from another person eating meals? 3  Help from another person taking care of personal grooming? 3  Help from another person toileting, which includes using toliet, bedpan, or urinal? 2  Help from another person bathing (including washing, rinsing, drying)? 2  Help from another person to put on and taking off regular upper body clothing? 3  Help from another person to put on and taking off regular lower body clothing? 2  6 Click Score 15  Progressive Mobility  What is the highest level of mobility based on the progressive mobility assessment? Level 4 (Walks with assist in room) - Balance while marching in place and cannot step forward and back - Complete  Mobility Out of bed to chair with meals;Ambulated with assistance in room  OT Recommendation  Follow Up Recommendations Home health OT;Supervision/Assistance - 24 hour  OT Equipment None recommended by OT  Individuals Consulted  Consulted and Agree with Results and Recommendations Patient;Family member/caregiver  Family Member Consulted son  Acute Rehab OT Goals  Patient Stated Goal go home  OT Goal Formulation With patient  Time For Goal Achievement 06/28/21  Potential to Achieve Goals Good  OT Time Calculation  OT Start Time (ACUTE ONLY) 0909  OT Stop Time (ACUTE ONLY) 0930  OT Time Calculation (min) 21 min  OT General Charges  $OT Visit 1 Visit  OT Evaluation  $OT Eval Low Complexity 1 Low  Written Expression  Dominant Hand Left   Delbert Phenix OT OT pager: [phone]

## 2021-06-14 NOTE — Progress Notes (Signed)
Modified Barium Swallow Progress Note  Patient Details  Name: [name] Small MRN: [id number] Date of Birth: 13-Feb-1928  Today's Date: 06/14/2021  Modified Barium Swallow completed.  Full report located under Chart Review in the Imaging Section.  Brief recommendations include the following:  Clinical Impression  Patient presents with functional oropharyngeal swallow ability. No aspiration and minimal penetration of thin liquids (thin barium halved with water) with [facility] for his age.  Pharyngeal swallow is strong without retention.  Barium tablet was not transited with first liquid bolus - tablet spilled to vallecular space and pt conducted dry swallow to transit into esophagus.  Pt did appear with barium tablet halting briefly near mid - esophagus without awareness (? near aortic arch).  Further boluses appeared to transited tablet into esophagus.  He also appeared with significant retrograde propulsion of thin to proximal esophagus without sensation.  Suspect his primary aspiration risk is esophageal and given his lack of senstion mitigation strategies are advised.  Radiologist was not present confirm findings and MBS is diagnostic for oropharyngeal swallow only.  Pt is on a PPI since Summer 2021 and given premorbid GERD diagnoisis, SLP encouraged him to follow precautions.   Swallow Evaluation Recommendations       SLP Diet Recommendations: Regular solids;Thin liquid   Liquid Administration via: Cup;Straw   Medication Administration: Whole meds with puree (start and follow with liquids)       Compensations: Slow rate;Small sips/bites   Postural Changes: Remain semi-upright after after feeds/meals (Comment);Seated upright at 90 degrees   Oral Care Recommendations: Oral care BID      Kathleen Lime, MS [facility] SLP Acute Rehab Services Office [phone] Pager [phone]   Macario Golds 06/14/2021,1:19 PM

## 2021-06-14 NOTE — Consult Note (Signed)
WOC Nurse Consult Note: Patient receiving care in [place] [address]. Primary RN present at time of assessment, as well as, a male and male visitor. Reason for Consult: "Deep Tissue Injury on Sacrum with MSAD" (MASD) Wound type: Chronic Tissue Injury (CTI) to sacral/buttocks areas from chronic sitting in recliner without repositioning prior to arrival to hospital.  There is also some peeling of the same areas from MASD which is resolving and currently dry.  There is a sacral foam dressing in proper position. Pressure Injury POA: Yes/No/NA Measurement: na Wound bed: blanchable purple hued Drainage (amount, consistency, odor) none Periwound: intact Dressing procedure/placement/frequency: Continue the use of the sacral foam dressing as prophylactic approach. Remind patient, and assist him when needed, to shift his weight while sitting in a chair and not to sit in one position without moving for more than one hour.  Thank you for the consult.  Discussed plan of care with the patient and bedside nurse.  [place] nurse will not follow at this time.  Please re-consult the [place] team if needed.  Val Riles, RN, MSN, CWOCN, CNS-BC, pager [phone]

## 2021-06-14 NOTE — Evaluation (Signed)
Physical Therapy Evaluation Patient Details Name: [name] Small MRN: [id number] DOB: 11-30-1927 Today's Date: 06/14/2021   History of Present Illness  [name] Small is a 87yo w/ a hx of nonobstructive CAD (cath 2012), PAD, CKD stage 3, HTN, DM2, and bladder CA who presented to the ED 06/11/21 w/ the acute onset of generalized weakness and hypoxia,CTA revealed no large PE, but did note R > LLL infiltrates suggestive of a possible aspiration. His resp difficutly worsened during his ER stay, requiring use of BiPAP to maintain sats at >90%.  Clinical Impression  Patient admitted for above  issues. Patient in bed, son at bedside. Patient is [facility] and son provided some information. Patient reports limited activity PTA, requires assistance for ADL's from wife.  Patient resting on 4 L [place], 95%. Patient mobilized to sitting onto bed edge with min assistance.  Patient stood and took a few steps using RW to recliner. Patient has orthopedic shoes for Left transmet amputation, (not in room.) Patient  " Pounds "hard on soles when taking steps.  Pt admitted with above diagnosis.  Pt currently with functional limitations due to the deficits listed below (see PT Problem List). Pt will benefit from skilled PT to increase their independence and safety with mobility to allow discharge to the venue listed below.       Follow Up Recommendations Home health PT    Equipment Recommendations  None recommended by PT    Recommendations for Other Services       Precautions / Restrictions Precautions Precautions: Fall Precaution Comments: monitor sats, L trans met amputation. wife to bring in shoes although patient reports  that he likes the socks      Mobility  Bed Mobility Overal bed mobility: Needs Assistance Bed Mobility: Rolling;Sidelying to Sit Rolling: Min assist Sidelying to sit: Min assist       General bed mobility comments: use of bed rail, close guard    Transfers Overall transfer level:  Needs assistance Equipment used: Rolling walker (2 wheeled) Transfers: Sit to/from [employer] Transfers Sit to Stand: +2 safety/equipment;From elevated surface;Min assist Stand pivot transfers: +2 safety/equipment;Mod assist       General transfer comment: patient  has on socks, hard "pounding" on feet when stepping to recliner.  Ambulation/Gait             General Gait Details: to recliner  Stairs            Wheelchair Mobility    Modified Rankin (Stroke Patients Only)       Balance Overall balance assessment: Needs assistance Sitting-balance support: Bilateral upper extremity supported;Feet supported Sitting balance-Leahy Scale: Good     Standing balance support: Bilateral upper extremity supported;During functional activity Standing balance-Leahy Scale: Poor Standing balance comment: reliant on UE support                             Pertinent Vitals/Pain Pain Assessment: No/denies pain    Home Living Family/patient expects to be discharged to:: Private residence Living Arrangements: Spouse/significant other Available Help at Discharge: Family;Available 24 hours/day Type of Home: House Home Access: Ramped entrance     Home Layout: One level Home Equipment: Shower seat;Hand held shower head;Grab bars - tub/shower;Cane - single point;Wheelchair - manual;Other (comment) Additional Comments: sleeps in lift chair    Prior Function Level of Independence: Needs assistance   Gait / Transfers Assistance Needed: patient prefers walker, will sit in wheelchair on back deck  ADL's / Homemaking Assistance Needed: [name] his spouse caths patient 3x/day, does not have physical strength to assist patient. patient states "I do it all on my own"        Hand Dominance   Dominant Hand: Left    Extremity/Trunk Assessment        Lower Extremity Assessment Lower Extremity Assessment: Generalized weakness;LLE deficits/detail LLE Deficits /  Details: transmet    Cervical / Trunk Assessment Cervical / Trunk Assessment: Normal  Communication   Communication: HOH  Cognition Arousal/Alertness: Awake/alert Behavior During Therapy: WFL for tasks assessed/performed Overall Cognitive Status: Within Functional Limits for tasks assessed                                        General Comments      Exercises     Assessment/Plan    PT Assessment Patient needs continued PT services  PT Problem List Decreased strength;Decreased balance;Decreased mobility;Decreased activity tolerance;Decreased safety awareness;Cardiopulmonary status limiting activity       PT Treatment Interventions DME instruction;Therapeutic activities;Gait training;Therapeutic exercise;Patient/family education;Functional mobility training    PT Goals (Current goals can be found in the Care Plan section)  Acute Rehab PT Goals Patient Stated Goal: go home PT Goal Formulation: With patient/family Time For Goal Achievement: 06/28/21 Potential to Achieve Goals: Good    Frequency Min 3X/week   Barriers to discharge        Co-evaluation PT/OT/SLP Co-Evaluation/Treatment: Yes Reason for Co-Treatment: To address functional/ADL transfers;For patient/therapist safety PT goals addressed during session: Mobility/safety with mobility OT goals addressed during session: ADL's and self-care       AM-PAC PT "6 Clicks" Mobility  Outcome Measure Help needed turning from your back to your side while in a flat bed without using bedrails?: A Little Help needed moving from lying on your back to sitting on the side of a flat bed without using bedrails?: A Little Help needed moving to and from a bed to a chair (including a wheelchair)?: A Lot Help needed standing up from a chair using your arms (e.g., wheelchair or bedside chair)?: A Lot Help needed to walk in hospital room?: A Lot Help needed climbing 3-5 steps with a railing? : Total 6 Click Score:  13    End of Session Equipment Utilized During Treatment: Gait belt Activity Tolerance: Patient tolerated treatment well;Patient limited by fatigue Patient left: in chair;with call bell/phone within reach;with chair alarm set;with family/visitor present Nurse Communication: Mobility status PT Visit Diagnosis: Unsteadiness on feet (R26.81)    Time: 6578-4696 PT Time Calculation (min) (ACUTE ONLY): 20 min   Charges:   PT Evaluation $PT Eval Low Complexity: [place] Pager [phone] Office [phone]   Claretha Cooper 06/14/2021, 1:27 PM

## 2021-06-15 LAB — CBC WITH DIFFERENTIAL/PLATELET
Abs Immature Granulocytes: 0.07 10*3/uL (ref 0.00–0.07)
Basophils Absolute: 0 10*3/uL (ref 0.0–0.1)
Basophils Relative: 0 %
Eosinophils Absolute: 0.2 10*3/uL (ref 0.0–0.5)
Eosinophils Relative: 2 %
HCT: 45.9 % (ref 39.0–52.0)
Hemoglobin: 14.5 g/dL (ref 13.0–17.0)
Immature Granulocytes: 1 %
Lymphocytes Relative: 22 %
Lymphs Abs: 1.5 10*3/uL (ref 0.7–4.0)
MCH: 32.7 pg (ref 26.0–34.0)
MCHC: 31.6 g/dL (ref 30.0–36.0)
MCV: 103.6 fL — ABNORMAL HIGH (ref 80.0–100.0)
Monocytes Absolute: 0.8 10*3/uL (ref 0.1–1.0)
Monocytes Relative: 12 %
Neutro Abs: 4.2 10*3/uL (ref 1.7–7.7)
Neutrophils Relative %: 63 %
Platelets: 160 10*3/uL (ref 150–400)
RBC: 4.43 MIL/uL (ref 4.22–5.81)
RDW: 13.4 % (ref 11.5–15.5)
WBC: 6.8 10*3/uL (ref 4.0–10.5)
nRBC: 0 % (ref 0.0–0.2)

## 2021-06-15 LAB — BASIC METABOLIC PANEL
Anion gap: 8 (ref 5–15)
BUN: 36 mg/dL — ABNORMAL HIGH (ref 8–23)
CO2: 37 mmol/L — ABNORMAL HIGH (ref 22–32)
Calcium: 8.4 mg/dL — ABNORMAL LOW (ref 8.9–10.3)
Chloride: 92 mmol/L — ABNORMAL LOW (ref 98–111)
Creatinine, Ser: 1.62 mg/dL — ABNORMAL HIGH (ref 0.61–1.24)
GFR, Estimated: 40 mL/min — ABNORMAL LOW (ref >60)
Glucose, Bld: 125 mg/dL — ABNORMAL HIGH (ref 70–99)
Potassium: 3.9 mmol/L (ref 3.5–5.1)
Sodium: 137 mmol/L (ref 135–145)

## 2021-06-15 LAB — GLUCOSE, CAPILLARY
Glucose-Capillary: 117 mg/dL — ABNORMAL HIGH (ref 70–99)
Glucose-Capillary: 148 mg/dL — ABNORMAL HIGH (ref 70–99)
Glucose-Capillary: 220 mg/dL — ABNORMAL HIGH (ref 70–99)
Glucose-Capillary: 278 mg/dL — ABNORMAL HIGH (ref 70–99)
Glucose-Capillary: 335 mg/dL — ABNORMAL HIGH (ref 70–99)

## 2021-06-15 LAB — MAGNESIUM: Magnesium: 2 mg/dL (ref 1.7–2.4)

## 2021-06-15 NOTE — Progress Notes (Signed)
  Speech Language Pathology Treatment: Dysphagia  Patient Details Name: [name] Small MRN: [id number] DOB: [birth year] Today's Date: 06/15/2021 Time: 8527-7824 SLP Time Calculation (min) (ACUTE ONLY): 11 min  Assessment / Plan / Recommendation Clinical Impression  SLP reviewed MBS finding with pt and his wife showing his wife flouro loops of the study using teach back and written instructions for compensation strategies.  Relayed that pt had no sensation to barium tablet transiently sticking in esophagus nor backflow of thin consisencies. Given his baseline GERD, kyphotic position, likely presbyesophagus- recommend he eat fully upright, consume liquids t/o meals and eat several small meals/day. Wife reports she and pt eat 2 meals a day in general and pt states they try not to eat after 3 pm.  Using teach back, provided all precautions. Wife denies pt having belching as noted yesterday.   No SLP follow up indicated as all education completed.  Encouraged pt to continue his IS but conduct BEFORE eating due to retrograde propulsion of liquids on MBS and increased risk of refluxing/aspirating.    HPI HPI: pt is a 87 yo male adm to [facility] with SOB, dyspnea - found to have right more than LLL infiltrate.  Pt PMH + fora 87yo w/ a hx of nonobstructive CAD (cath 2012), PAD, CKD stage 3, HTN, DM2, and bladder CA who presented to the ED w/ the acute onset of generalized weakness and hypoxia.  He admitted to not feeling well since at least 05/31/21 with poor intake of solids or liquids, generalized weakness, and decreased UOP (via his usual I/O caths).    Pt was noted to have elevated troponins and an elevated d-dimer per MD notes. CTA revealed no large PE, but did note R > LLL infiltrates suggestive of a possible aspiration. His resp difficutly worsened during his ER stay, requiring use of BiPAP to maintain sats at >90%.     Patient was weaned off BiPAP to [place] during hospital coarse.  Swallow evaluation ordered due to  concerns for aspiration.  Pt has h/o GI bleed diagnosed after having coffee ground emesis - -  underwent endoscopy 05/2020 with findings of stomach ulcer and esophagitis.  He was placed on a PPI BID.  Pt denies dysphagia - admits to occasional "choking". Wife reports pt coughing more AFTER eating/drinking than during.  Pt is on 2 liters of oxygen at home at night.  He resides with his wife.      SLP Plan  All goals met       Recommendations  Diet recommendations: Regular;Thin liquid Liquids provided via: Cup;Straw Medication Administration: Whole meds with puree Supervision: Patient able to self feed Compensations: Slow rate;Small sips/bites Postural Changes and/or Swallow Maneuvers: Seated upright 90 degrees;Upright 30-60 min after meal                Oral Care Recommendations: Oral care BID Follow up Recommendations: None SLP Visit Diagnosis: Dysphagia, unspecified (R13.10) Plan: All goals met       GO              Kathleen Lime, MS [facility] SLP Acute Rehab Services Office [phone] Pager [phone]   Macario Golds 06/15/2021, 1:02 PM

## 2021-06-15 NOTE — Progress Notes (Signed)
Daily Progress Note   Patient Name: [name] Small       Date: 06/15/2021 DOB: [birth year]  Age: 87 y.o. MRN#: [id number] Attending Physician: Dwyane Dee, MD Primary Care Physician: Tonia Ghent, MD Admit Date: 06/11/2021  Reason for Consultation/Follow-up: Establishing goals of care  Subjective: Patient is awake alert sitting up in bed.  He is able to feed himself.  Wife present at bedside.  Length of Stay: 4  Current Medications: Scheduled Meds:   Apremilast  30 mg Oral Daily   atorvastatin  10 mg Oral Daily   chlorhexidine  15 mL Mouth Rinse BID   Chlorhexidine Gluconate Cloth  6 each Topical Q0600   docusate sodium  100 mg Oral BID   doxycycline  50 mg Oral Daily   finasteride  5 mg Oral Daily   heparin  5,000 Units Subcutaneous Q8H   insulin aspart  0-5 Units Subcutaneous QHS   insulin aspart  0-9 Units Subcutaneous TID WC   mouth rinse  15 mL Mouth Rinse q12n4p   pantoprazole  80 mg Oral Daily   tamsulosin  0.8 mg Oral Daily   umeclidinium bromide  1 puff Inhalation Daily    Continuous Infusions:   PRN Meds: acetaminophen **OR** acetaminophen, ondansetron **OR** ondansetron (ZOFRAN) IV, traMADol  Physical Exam         Elderly gentleman sitting up in bed Appears with no acute distress today Regular work of breathing S1-S2 does not have edema Verbalizes normally answers questions appropriately today  Vital Signs: BP 130/63 (BP Location: Right Arm)   Pulse 72   Temp 97.6 F (36.4 C) (Oral)   Resp 16   Ht 6\' 2"  (1.88 m)   Wt 95.3 kg   SpO2 95%   BMI 26.96 kg/m  SpO2: SpO2: 95 % O2 Device: O2 Device: Nasal Cannula O2 Flow Rate: O2 Flow Rate (L/min): 2.5 L/min  Intake/output summary:  Intake/Output Summary (Last 24 hours) at 06/15/2021 1335 Last data  filed at 06/15/2021 3536 Gross per 24 hour  Intake 120 ml  Output 1425 ml  Net -1305 ml   LBM: Last BM Date: 06/13/21 Baseline Weight: Weight: 95.3 kg Most recent weight: Weight: 95.3 kg       Palliative Assessment/Data:      Patient Active Problem List   Diagnosis Date  Noted   Goals of care, counseling/discussion 06/14/2021   Aspiration pneumonia ([place]) 06/13/2021   Demand ischemia ([place]) 06/13/2021   Chronic kidney disease, stage 3b ([place]) 06/13/2021   Chronic cystitis 06/13/2021   Ulcer of foot ([place]) 11/07/2020   Recurrent UTI 07/31/2020   Insomnia 05/25/2020   Advance care planning    Gastroesophageal reflux disease with esophagitis without hemorrhage    Ulcer of esophagus without bleeding    Urinary retention with incomplete bladder emptying 05/10/2020   Adjustment reaction of late life    Acute kidney injury ([place])    Gross hematuria    Controlled type 2 diabetes mellitus with hyperglycemia, with long-term current use of insulin (HCC)    Benign essential HTN    Anxiety 12/15/2019   Anemia 01/31/2017   Acute respiratory failure with hypoxia and hypercapnia (HCC)    Status post lumbar laminectomy 12/26/2016   History of femur fracture 12/26/2016   Idiopathic chronic venous hypertension of left lower extremity with inflammation 11/07/2016   Spinal stenosis of lumbar region with neurogenic claudication 10/18/2016   COPD (chronic obstructive pulmonary disease) ([place]) 06/10/2016   Weakness generalized 03/18/2016   Critical lower limb ischemia ([place]) 02/28/2016   Peripheral arterial disease ([place]) 10/17/2015   Edema 07/19/2015   Claudication ([place]) 01/31/2015   DOE (dyspnea on exertion) 01/31/2015   Hyperlipidemia 01/31/2015   HTN (hypertension) 01/31/2015   Acute on chronic diastolic CHF (congestive heart failure) ([place]) 01/21/2014   Atrial fibrillation ([place]) 12/16/2012   Bladder cancer ([place]) 09/22/2011   Leg cramps 08/28/2011   Cough 05/26/2011   CAD (coronary artery  disease) of artery bypass graft 05/09/2011   Dyspnea on exertion 04/19/2011   PSORIASIS, SCALP 10/25/2008   Chronic kidney disease, stage III (moderate) ([place]) 12/04/2007   DMII (diabetes mellitus, type 2) ([place]) 05/21/2007   DIVERTICULOSIS, COLON 05/21/2007   BPH (benign prostatic hyperplasia) 05/21/2007    Palliative Care Assessment & Plan   Patient Profile:    Assessment:  87 year old gentleman who lives at home with his wife.  Patient has a history of coronary artery disease, peripheral arterial disease, stage III chronic kidney disease hypertension diabetes bladder cancer.  Patient presented to the hospital with acute generalized weakness and hypoxia.  Patient has been admitted for aspiration pneumonia.    Patient has advance care planning documents in the [facility] app and these have been reviewed.  This included DO NOT RESUSCITATE form as well as a healthcare power of attorney form designating his wife [name] Small as his healthcare power of attorney agent.  Palliative medicine consultation for additional goals of care discussions has been requested.  Recommendations/Plan: Modified barium swallow results noted.  Occupational Therapy is also following.  Patient advised to have several small meals and to follow as much aspiration precautions as possible.  However, he has been cleared to be on a regular diet with thin liquids. Discussed with wife at bedside about broad goals of care.  Discussed about differences between hospice versus palliative mode of care.  Patient has just some CNA services through the [place] as of now.   At present, plan is for home-based physical therapy, home-based nursing support.  In addition there is a request for home-based palliative care to start following.  This is certainly very reasonable.  Discussed with wife to the best of my ability.  Appreciate TOC colleague Randall Hiss who will also follow-up shortly.   Code Status:    Code Status Orders  (From admission, onward)  Start     Ordered   06/11/21 1630  Do not attempt resuscitation (DNR)  Continuous       Question Answer Comment  In the event of cardiac or respiratory ARREST Do not call a "code blue"   In the event of cardiac or respiratory ARREST Do not perform Intubation, CPR, defibrillation or ACLS   In the event of cardiac or respiratory ARREST Use medication by any route, position, wound care, and other measures to relive pain and suffering. May use oxygen, suction and manual treatment of airway obstruction as needed for comfort.      06/11/21 1631           Code Status History     Date Active Date Inactive Code Status Order ID Comments User Context   06/11/2021 1615 06/11/2021 1631 DNR [id number]  Cherene Altes, MD ED   07/31/2020 1659 08/09/2020 0151 DNR [id number]  Harold Hedge, MD ED   05/15/2020 1232 05/16/2020 2229 Partial Code [id number]  Knox Royalty, NP Inpatient   05/14/2020 1339 05/15/2020 1232 Full Code [id number]  Donnamae Jude, MD ED   04/26/2020 1648 05/12/2020 1520 Full Code [id number]  Bary Leriche, PA-C Inpatient   04/18/2020 1237 04/26/2020 1644 Full Code [id number]  Little Ishikawa, MD ED   12/26/2016 1408 12/31/2016 1920 Full Code [id number]  Jessy Oto, MD Inpatient   10/18/2016 1749 10/19/2016 1736 Full Code [id number]  Jessy Oto, MD Inpatient   06/10/2016 1304 06/14/2016 1643 Full Code [id number]  Radene Gunning, NP ED   01/18/2014 0146 01/21/2014 1508 DNR [id number]  Toy Baker, MD Inpatient      Advance Directive Documentation    Flowsheet Row Most Recent Value  Type of Advance Directive Healthcare Power of Attorney  Pre-existing out of facility DNR order (yellow form or pink MOST form) --  "MOST" Form in Place? --       Prognosis:  Unable to determine  Discharge Planning: Home with Home Health As well as home based palliative care, as per wife's request.   Care plan was discussed with  patient, wife at bedside, also briefly discussed with TOC,  appreciate their assistance.   Thank you for allowing the Palliative Medicine Team to assist in the care of this patient.   Time In: 1300 Time Out: 1335 Total Time 35 Prolonged Time Billed  no       Greater than 50%  of this time was spent counseling and coordinating care related to the above assessment and plan.  Loistine Chance, MD  Please contact Palliative Medicine Team phone at [phone] for questions and concerns.

## 2021-06-15 NOTE — Care Management Important Message (Signed)
Important Message  Patient Details IM Letter placed in Patient's room. Name: [name] Small MRN: [id number] Date of Birth: 10/08/1928   Medicare Important Message Given:  Yes     Kerin Salen 06/15/2021, 10:54 AM

## 2021-06-15 NOTE — Progress Notes (Signed)
Physical Therapy Treatment Patient Details Name: [name] Small MRN: [id number] DOB: 07-11-1928 Today's Date: 06/15/2021    History of Present Illness [name] Small is a 87yo w/ a hx of nonobstructive CAD (cath 2012), PAD, CKD stage 3, HTN, DM2, and bladder CA who presented to the ED 06/11/21 w/ the acute onset of generalized weakness and hypoxia,CTA revealed no large PE, but did note R > LLL infiltrates suggestive of a possible aspiration. His resp difficutly worsened during his ER stay, requiring use of BiPAP to maintain sats at >90%.    PT Comments    Patient  eager to mobilize. No assist to sit up. SPO2 on 2.5 L >92 %. Patient ambulated x 60 " using RW, gait steady. Patient ambulated on RA. SPO2 86% at end. Replaced 2.5 L, SPO2 back to 94%.  Follow Up Recommendations  Home health PT     Equipment Recommendations  None recommended by PT    Recommendations for Other Services       Precautions / Restrictions Precautions Precautions: Fall Precaution Comments: monitor sats, L trans met amputation.  patient reports  that he likes the socks, has ortho shoes at home    Mobility  Bed Mobility Overal bed mobility: Modified Independent Bed Mobility: Supine to Sit Rolling: Min guard   Supine to sit: Supervision;HOB elevated     General bed mobility comments: use of bed rail, min guard A for safety    Transfers Overall transfer level: Needs assistance Equipment used: Rolling walker (2 wheeled) Transfers: Sit to/from Stand Sit to Stand: Min assist         General transfer comment: R lateral lean initially with powering up to standing, then able to ambulate with min A x1 and chair follow  Ambulation/Gait Ambulation/Gait assistance: Min assist;+2 safety/equipment Gait Distance (Feet): 60 Feet Assistive device: Rolling walker (2 wheeled) Gait Pattern/deviations: Step-through pattern Gait velocity: decr   General Gait Details: cues for safety   Stairs              Wheelchair Mobility    Modified Rankin (Stroke Patients Only)       Balance Overall balance assessment: Needs assistance Sitting-balance support: Feet supported Sitting balance-Leahy Scale: Good     Standing balance support: Bilateral upper extremity supported;During functional activity Standing balance-Leahy Scale: Poor Standing balance comment: reliant on UE support, soewhat unsteady                            Cognition Arousal/Alertness: Awake/alert Behavior During Therapy: WFL for tasks assessed/performed Overall Cognitive Status: Within Functional Limits for tasks assessed                                 General Comments: following directions appropriately      Exercises      General Comments        Pertinent Vitals/Pain Pain Assessment: No/denies pain Faces Pain Scale: No hurt Pain Location: back of head    Home Living                      Prior Function            PT Goals (current goals can now be found in the care plan section) Acute Rehab PT Goals Patient Stated Goal: go home Progress towards PT goals: Progressing toward goals    Frequency  Min 3X/week      PT Plan Current plan remains appropriate    Co-evaluation PT/OT/SLP Co-Evaluation/Treatment: Yes Reason for Co-Treatment: To address functional/ADL transfers PT goals addressed during session: Mobility/safety with mobility OT goals addressed during session: ADL's and self-care      AM-PAC PT "6 Clicks" Mobility   Outcome Measure  Help needed turning from your back to your side while in a flat bed without using bedrails?: A Little Help needed moving from lying on your back to sitting on the side of a flat bed without using bedrails?: A Little Help needed moving to and from a bed to a chair (including a wheelchair)?: A Little Help needed standing up from a chair using your arms (e.g., wheelchair or bedside chair)?: A Little Help needed to  walk in hospital room?: A Little Help needed climbing 3-5 steps with a railing? : A Lot 6 Click Score: 17    End of Session Equipment Utilized During Treatment: Gait belt Activity Tolerance: Patient tolerated treatment well Patient left: in chair;with call bell/phone within reach;with chair alarm set;with family/visitor present Nurse Communication: Mobility status PT Visit Diagnosis: Unsteadiness on feet (R26.81)     Time: 0725-0750 PT Time Calculation (min) (ACUTE ONLY): 25 min  Charges:  $Gait Training: 8-22 mins                     Tresa Endo PT Acute Rehabilitation Services Pager [phone] Office [phone]    Claretha Cooper 06/15/2021, 2:23 PM

## 2021-06-15 NOTE — Progress Notes (Signed)
Progress Note    [name] Small   QMV:[id number]  DOB: [birth year]  DOA: 06/11/2021     4  PCP: Tonia Ghent, MD  CC: Weakness  Hospital Course: [name] Small is a 87yo w/ a hx of nonobstructive CAD (cath 2012), PAD, CKD stage 3, HTN, DM2, and bladder CA who presented to the ED w/ the acute onset of generalized weakness and hypoxia. He requires nocturnal 2L [place] O2 support at baseline, but noted sats in the 80s the morning of admission. He was feeling fatigued in association with this finding. He admitted to not feeling well since at least 05/31/21 with poor intake of solids or liquids, generalized weakness, and decreased UOP (via his usual I/O caths).   In the ER he was noted to have elevated troponins and an elevated d-dimer. CTA revealed no large PE, but did note R > LLL infiltrates suggestive of a possible aspiration. His resp difficutly worsened during his ER stay, requiring use of BiPAP to maintain sats at >90%.   Patient was weaned off BiPAP to [place] He remained lethargic with poor oral intake during hospitalization.  His wife elected for having palliative care come discuss further goals of care and to assist with possibly obtaining more help in the home at time of discharge.  Interval History:  No events overnight.  Wife present bedside this morning.  We discussed plan is for discharging home tomorrow.  Patient otherwise feeling okay with no concerns or questions.  ROS: Constitutional: positive for fatigue and malaise, Respiratory: negative for cough, Cardiovascular: negative for chest pain, and Gastrointestinal: negative for abdominal pain  Assessment & Plan: * Acute respiratory failure with hypoxia and hypercapnia (HCC) -Suspected combination of aspiration pneumonitis, diastolic heart failure -ABG 7.297 / 78  -Off BiPAP -Continue to wean oxygen to baseline use of 2 L   Aspiration pneumonia ([place]) -Hold off on antibiotics  Goals of care, counseling/discussion -  Palliative care consulted per family request - Patient has been less mobile at home recently but appears to be motivated some for having physical therapy come to the house.  Wife is against patient going to rehab due to prior bad experience - max HH to be ordered in anticipation of d/c home  Chronic cystitis - on chronic doxycycline   Chronic kidney disease, stage 3b (HCC) -Baseline Cr ~1.8-2.3  -Stable  Demand ischemia ([place]) -Likely demand ischemia -Cardiology following -IV heparin discontinued  Hyperlipidemia - Continue Lipitor  Acute on chronic diastolic CHF (congestive heart failure) (HCC) -BNP 304.5 -Echo EF 55-60%, grade 2 dd -Cardiology following - lasix per cardiology   DMII (diabetes mellitus, type 2) ([place]) -Ha1c 8.2 -Continue sliding scale insulin   Old records reviewed in assessment of this patient  Antimicrobials: Chronic doxycycline  DVT prophylaxis: heparin injection 5,000 Units Start: 06/12/21 1400   Code Status:   Code Status: DNR Family Communication: Wife  Disposition Plan: Status is: Inpatient  Remains inpatient appropriate because:Unsafe d/c plan and Inpatient level of care appropriate due to severity of illness  Dispo: The patient is from: Home              Anticipated d/c is to: Home              Patient currently is medically stable to d/c.  Plan for discharge Saturday   Difficult to place patient No  Risk of unplanned readmission score: Unplanned Admission- Pilot do not use: 19.01   Objective: Blood pressure (!) 122/53, pulse 70, temperature  98 F (36.7 C), resp. rate 16, height 6\' 2"  (1.88 m), weight 95.3 kg, SpO2 96 %.  Examination: General appearance: cooperative, fatigued, and no distress Head: Normocephalic, without obvious abnormality, atraumatic Eyes:  EOMI Lungs:  Subtle crackles otherwise relatively clear Heart: regular rate and rhythm and S1, S2 normal Abdomen: normal findings: bowel sounds normal and soft,  non-tender Extremities:  No edema Skin: mobility and turgor normal Neurologic: Grossly normal  Consultants:  Cardiology Palliative care  Procedures:    Data Reviewed: I have personally reviewed following labs and imaging studies Results for orders placed or performed during the hospital encounter of 06/11/21 (from the past 24 hour(s))  Glucose, capillary     Status: Abnormal   Collection Time: 06/14/21  6:27 PM  Result Value Ref Range   Glucose-Capillary 319 (H) 70 - 99 mg/dL  Glucose, capillary     Status: Abnormal   Collection Time: 06/14/21  9:02 PM  Result Value Ref Range   Glucose-Capillary 152 (H) 70 - 99 mg/dL  Glucose, capillary     Status: Abnormal   Collection Time: 06/15/21  4:39 AM  Result Value Ref Range   Glucose-Capillary 117 (H) 70 - 99 mg/dL  Basic metabolic panel     Status: Abnormal   Collection Time: 06/15/21  4:52 AM  Result Value Ref Range   Sodium 137 135 - 145 mmol/L   Potassium 3.9 3.5 - 5.1 mmol/L   Chloride 92 (L) 98 - 111 mmol/L   CO2 37 (H) 22 - 32 mmol/L   Glucose, Bld 125 (H) 70 - 99 mg/dL   BUN 36 (H) 8 - 23 mg/dL   Creatinine, Ser 1.62 (H) 0.61 - 1.24 mg/dL   Calcium 8.4 (L) 8.9 - 10.3 mg/dL   GFR, Estimated 40 (L) >60 mL/min   Anion gap 8 5 - 15  CBC with Differential/Platelet     Status: Abnormal   Collection Time: 06/15/21  4:52 AM  Result Value Ref Range   WBC 6.8 4.0 - 10.5 K/uL   RBC 4.43 4.22 - 5.81 MIL/uL   Hemoglobin 14.5 13.0 - 17.0 g/dL   HCT 45.9 39.0 - 52.0 %   MCV 103.6 (H) 80.0 - 100.0 fL   MCH 32.7 26.0 - 34.0 pg   MCHC 31.6 30.0 - 36.0 g/dL   RDW 13.4 11.5 - 15.5 %   Platelets 160 150 - 400 K/uL   nRBC 0.0 0.0 - 0.2 %   Neutrophils Relative % 63 %   Neutro Abs 4.2 1.7 - 7.7 K/uL   Lymphocytes Relative 22 %   Lymphs Abs 1.5 0.7 - 4.0 K/uL   Monocytes Relative 12 %   Monocytes Absolute 0.8 0.1 - 1.0 K/uL   Eosinophils Relative 2 %   Eosinophils Absolute 0.2 0.0 - 0.5 K/uL   Basophils Relative 0 %   Basophils  Absolute 0.0 0.0 - 0.1 K/uL   Immature Granulocytes 1 %   Abs Immature Granulocytes 0.07 0.00 - 0.07 K/uL  Magnesium     Status: None   Collection Time: 06/15/21  4:52 AM  Result Value Ref Range   Magnesium 2.0 1.7 - 2.4 mg/dL  Glucose, capillary     Status: Abnormal   Collection Time: 06/15/21  8:12 AM  Result Value Ref Range   Glucose-Capillary 148 (H) 70 - 99 mg/dL  Glucose, capillary     Status: Abnormal   Collection Time: 06/15/21 12:01 PM  Result Value Ref Range   Glucose-Capillary 220 (H)  70 - 99 mg/dL    Recent Results (from the past 240 hour(s))  Resp Panel by RT-PCR (Flu A&B, Covid) Nasopharyngeal Swab     Status: None   Collection Time: 06/11/21  8:48 AM   Specimen: Nasopharyngeal Swab; Nasopharyngeal(NP) swabs in vial transport medium  Result Value Ref Range Status   SARS Coronavirus 2 by RT PCR NEGATIVE NEGATIVE Final    Comment: (NOTE) SARS-CoV-2 target nucleic acids are NOT DETECTED.  The SARS-CoV-2 RNA is generally detectable in upper respiratory specimens during the acute phase of infection. The lowest concentration of SARS-CoV-2 viral copies this assay can detect is 138 copies/mL. A negative result does not preclude SARS-Cov-2 infection and should not be used as the sole basis for treatment or other patient management decisions. A negative result may occur with  improper specimen collection/handling, submission of specimen other than nasopharyngeal swab, presence of viral mutation(s) within the areas targeted by this assay, and inadequate number of viral copies(<138 copies/mL). A negative result must be combined with clinical observations, patient history, and epidemiological information. The expected result is Negative.  Fact Sheet for Patients:  EntrepreneurPulse.com.au  Fact Sheet for Healthcare Providers:  IncredibleEmployment.be  This test is no t yet approved or cleared by the [place] FDA and  has been  authorized for detection and/or diagnosis of SARS-CoV-2 by FDA under an Emergency Use Authorization (EUA). This EUA will remain  in effect (meaning this test can be used) for the duration of the COVID-19 declaration under Section 564(b)(1) of the Act, 21 U.S.C.section 360bbb-3(b)(1), unless the authorization is terminated  or revoked sooner.       Influenza A by PCR NEGATIVE NEGATIVE Final   Influenza B by PCR NEGATIVE NEGATIVE Final    Comment: (NOTE) The Xpert Xpress SARS-CoV-2/FLU/RSV plus assay is intended as an aid in the diagnosis of influenza from Nasopharyngeal swab specimens and should not be used as a sole basis for treatment. Nasal washings and aspirates are unacceptable for Xpert Xpress SARS-CoV-2/FLU/RSV testing.  Fact Sheet for Patients: EntrepreneurPulse.com.au  Fact Sheet for Healthcare Providers: IncredibleEmployment.be  This test is not yet approved or cleared by the [place] FDA and has been authorized for detection and/or diagnosis of SARS-CoV-2 by FDA under an Emergency Use Authorization (EUA). This EUA will remain in effect (meaning this test can be used) for the duration of the COVID-19 declaration under Section 564(b)(1) of the Act, 21 U.S.C. section 360bbb-3(b)(1), unless the authorization is terminated or revoked.  Performed at [facility], [place] [address] [place], [place] [address]   MRSA Next Gen by PCR, Nasal     Status: None   Collection Time: 06/14/21 11:20 AM   Specimen: Nasal Mucosa; Nasal Swab  Result Value Ref Range Status   MRSA by PCR Next Gen NOT DETECTED NOT DETECTED Final    Comment: (NOTE) The GeneXpert MRSA Assay (FDA approved for NASAL specimens only), is one component of a comprehensive MRSA colonization surveillance program. It is not intended to diagnose MRSA infection nor to guide or monitor treatment for MRSA infections. Test performance is not FDA approved in  patients less than 16 years old. Performed at [facility], [place] [address] [place], [place] [address]      Radiology Studies: DG Swallowing Func-Speech Pathology  Result Date: 06/14/2021 Formatting of this result is different from the original. Objective Swallowing Evaluation: Type of Study: MBS-Modified Barium Swallow Study  Patient Details Name: LAMIN CHANDLEY MRN: [id number] Date of Birth:  Aug 04, 1928 Today's Date: 06/14/2021 Time: SLP Start Time (ACUTE ONLY): 1235 -SLP Stop Time (ACUTE ONLY): 1301 SLP Time Calculation (min) (ACUTE ONLY): 26 min Past Medical History: Past Medical History: Diagnosis Date  (HFpEF) heart failure with preserved ejection fraction (HCC)   Arthritis   Back pain   s/p lumbar injection 2014  BENIGN PROSTATIC HYPERTROPHY 05/21/2007  Bladder cancer ([place]) 10/2011  CAD (coronary artery disease)   nonobstructive by cath 6/12:  mid LAD 30%, proximal obtuse marginal-2 30%, proximal RCA 20%, mid RCA 30-40%.  He had normal cardiac output and mildly elevated filling pressures but no significant pulmonary hypertension;   Echocardiogram in May 2012 demonstrated EF 50-55% and left atrial enlargement   Cellulitis of left foot   Hospitalized in 2006  Chronic kidney disease (CKD), stage III (moderate) (HCC) 12/04/2007  FOLLOWED BY DR PATEL  Chronic pain of lower extremity   Complication of anesthesia   1 time bladder cancer surgery- 2012 , oxygen satruratioon dropped had to stay overnight, no problems since then.  COPD (chronic obstructive pulmonary disease) ([place])   DIABETES MELLITUS, TYPE II 05/21/2007  DIVERTICULOSIS, COLON 05/21/2007  pt denies  Dyspnea   with exertion, patient mointors saturation  Fatigue AGE-RELATED  Gross hematuria   pt denies  HYPERTENSION 05/21/2007  Impaired hearing BILATERAL HEARING AIDS  On home oxygen therapy   as needed  PAD (peripheral artery disease) ([place])   a. 02/2016 L foot nonhealing ulcer-->Periph angio: L pop 100 w/ reconstitution via extensive  vollats to the prox-mid peroneal (only patent vessel BK)-->PTA of L Pop and peroneal; b. Ongoing LE ischemia 5 & 06/2016 req amputation of toes on L foot.  Pneumonia   Hx  of   Psoriasis ELBOWS  Psoriasis   PSORIASIS, SCALP 10/25/2008 Past Surgical History: Past Surgical History: Procedure Laterality Date  AMPUTATION Left 03/27/2016  Procedure: partial first AMPUTATION RAY; LEFT;  Surgeon: Trula Slade, DPM;  Location: [place];  Service: Podiatry;  Laterality: Left;  AMPUTATION Left 06/12/2016  Procedure: TRANSMETATARSAL AMPUTATION LEFT FOOT;  Surgeon: Newt Minion, MD;  Location: [place];  Service: Orthopedics;  Laterality: Left;  APPENDECTOMY  1941  BIOPSY  05/16/2020  Procedure: BIOPSY;  Surgeon: Lavena Bullion, DO;  Location: [facility];  Service: Gastroenterology;;  CARDIAC CATHETERIZATION  04-24-11/  DR Rogue Jury ARIDA  MILD NONOBSTRUCTIVE CAD, NORMA CARDIAC OUTPUT  CARDIOVASCULAR STRESS TEST  2007  CATARACT EXTRACTION W/ INTRAOCULAR LENS  IMPLANT, BILATERAL Bilateral ~ 2010  CYSTOSCOPY  12/25/2011  Procedure: CYSTOSCOPY;  Surgeon: Molli Hazard, MD;  Location: [facility];  Service: Urology;  Laterality: N/A;  needs intubation and to be paralyzed   CYSTOSCOPY W/ RETROGRADES  10/30/2011  Procedure: CYSTOSCOPY WITH RETROGRADE PYELOGRAM;  Surgeon: Molli Hazard, MD;  Location: [facility];  Service: Urology;  Laterality: Bilateral;  CYSTOSCOPY POSS TURBT BILATERAL RETROGRADE PYLEOGRAM   CYSTOSCOPY W/ RETROGRADES  11/30/2012  Procedure: CYSTOSCOPY WITH RETROGRADE PYELOGRAM;  Surgeon: Molli Hazard, MD;  Location[facility];  Service: Urology;  Laterality: Bilateral;  Flexible cystoscopy.  CYSTOSCOPY WITH BIOPSY  11/30/2012  Procedure: CYSTOSCOPY WITH BIOPSY;  Surgeon: Molli Hazard, MD;  Location: [facility];  Service: Urology;  Laterality: N/A;  ESOPHAGOGASTRODUODENOSCOPY (EGD) WITH PROPOFOL N/A 05/16/2020  Procedure:  ESOPHAGOGASTRODUODENOSCOPY (EGD) WITH PROPOFOL;  Surgeon: Lavena Bullion, DO;  Location: [facility];  Service: Gastroenterology;  Laterality: N/A;  FEMUR IM NAIL Left 08/01/2020  Procedure: INTRAMEDULLARY (IM) RETROGRADE FEMORAL NAILING;  Surgeon: Altamese [place], MD;  Location: [place];  Service: Orthopedics;  Laterality: Left;  INCISION AND DRAINAGE FOOT Left ~ 2005  LEFT FOOT DUE TO INFECTION FROM  NAIL PUNCTURE INJURY  LUMBAR LAMINECTOMY/DECOMPRESSION MICRODISCECTOMY N/A 10/18/2016  Procedure: LEFT AND CENTRAL L4-5 LUMBAR LAMINECTOMY WITH RESECTION OF SYNOVIAL CYST;  Surgeon: Jessy Oto, MD;  Location: [place];  Service: Orthopedics;  Laterality: N/A;  ORIF FEMUR FRACTURE Left 04/19/2020  Procedure: OPEN REDUCTION INTERNAL FIXATION FEMORAL SHAFT FRACTURE;  Surgeon: Shona Needles, MD;  Location: [place];  Service: Orthopedics;  Laterality: Left;  PENILE PROSTHESIS IMPLANT  1990s  PERIPHERAL VASCULAR CATHETERIZATION N/A 02/14/2016  Procedure: Abdominal Aortogram w/Lower Extremity;  Surgeon: Wellington Hampshire, MD;  Location: [place] [facility];  Service: Cardiovascular;  Laterality: N/A;  PERIPHERAL VASCULAR CATHETERIZATION Left 02/28/2016  Procedure: Peripheral Vascular Balloon Angioplasty;  Surgeon: Wellington Hampshire, MD;  Location: [place] [facility];  Service: Cardiovascular;  Laterality: Left;  left peroneal and popliteal artery  TONSILLECTOMY AND ADENOIDECTOMY  1962  TOTAL HIP ARTHROPLASTY Left 12/27/2016  Procedure: LEFT TOTAL HIP ARTHROPLASTY ANTERIOR APPROACH;  Surgeon: Mcarthur Rossetti, MD;  Location: [facility];  Service: Orthopedics;  Laterality: Left;  TRANSURETHRAL RESECTION OF BLADDER TUMOR  10/30/2011  Procedure: TRANSURETHRAL RESECTION OF BLADDER TUMOR (TURBT);  Surgeon: Molli Hazard, MD;  Location: [facility];  Service: Urology;  Laterality: N/A;  TRANSURETHRAL RESECTION OF BLADDER TUMOR  12/25/2011  Procedure: TRANSURETHRAL RESECTION OF BLADDER TUMOR (TURBT);  Surgeon: Molli Hazard, MD;  Location[facility];  Service: Urology;  Laterality: N/A;  need long gyrus instruments   VASECTOMY  1990s HPI: pt is a 87 yo male adm to [facility] with SOB, dyspnea - found to have right more than LLL infiltrate.  Pt PMH + fora 87yo w/ a hx of nonobstructive CAD (cath 2012), PAD, CKD stage 3, HTN, DM2, and bladder CA who presented to the ED w/ the acute onset of generalized weakness and hypoxia.  He admitted to not feeling well since at least 05/31/21 with poor intake of solids or liquids, generalized weakness, and decreased UOP (via his usual I/O caths).    Pt was noted to have elevated troponins and an elevated d-dimer per MD notes. CTA revealed no large PE, but did note R > LLL infiltrates suggestive of a possible aspiration. His resp difficutly worsened during his ER stay, requiring use of BiPAP to maintain sats at >90%.     Patient was weaned off BiPAP to [place] during hospital coarse.  Swallow evaluation ordered due to concerns for aspiration.  Pt has h/o GI bleed diagnosed after having coffee ground emesis - -  underwent endoscopy 05/2020 with findings of stomach ulcer and esophagitis.  He was placed on a PPI BID.  Pt denies dysphagia - admits to occasional "choking". Wife reports pt coughing more AFTER eating/drinking than during.  Pt is on 2 liters of oxygen at home at night.  He resides with his wife.  Subjective: pt awake in flouro chair Assessment / Plan / Recommendation CHL IP CLINICAL IMPRESSIONS 06/14/2021 Clinical Impression Patient presents with functional oropharyngeal swallow ability. No aspiration and minimal penetration of thin liquids (thin barium halved with water) with [facility] for his age.  Pharyngeal swallow is strong without retention.  Barium tablet was not transited with first liquid bolus - tablet spilled to vallecular space and pt conducted dry swallow to transit into esophagus.  Pt did appear with barium tablet halting  briefly near mid - esophagus without awareness  (? near aortic arch).  Further boluses appeared to transited tablet into esophagus.  He also appeared with significant retrograde propulsion of thin to proximal esophagus without sensation.  Suspect his primary aspiration risk is esophageal and given his lack of senstion mitigation strategies are advised.  Radiologist was not present confirm findings and MBS is diagnostic for oropharyngeal swallow only.  Pt is on a PPI since Summer 2021 and given premorbid GERD diagnoisis, SLP encouraged him to follow precautions. SLP Visit Diagnosis Dysphagia, unspecified (R13.10) Attention and concentration deficit following -- Frontal lobe and executive function deficit following -- Impact on safety and function Mild aspiration risk   No flowsheet data found.  Prognosis 06/14/2021 Prognosis for Safe Diet Advancement Good Barriers to Reach Goals -- Barriers/Prognosis Comment -- CHL IP DIET RECOMMENDATION 06/14/2021 SLP Diet Recommendations Regular solids;Thin liquid Liquid Administration via Cup;Straw Medication Administration Whole meds with puree Compensations Slow rate;Small sips/bites Postural Changes Remain semi-upright after after feeds/meals (Comment);Seated upright at 90 degrees   CHL IP OTHER RECOMMENDATIONS 06/14/2021 Recommended Consults -- Oral Care Recommendations Oral care BID Other Recommendations --   CHL IP FOLLOW UP RECOMMENDATIONS 06/14/2021 Follow up Recommendations None   CHL IP FREQUENCY AND DURATION 06/14/2021 Speech Therapy Frequency (ACUTE ONLY) min 1 x/week Treatment Duration 1 week      CHL IP ORAL PHASE 06/14/2021 Oral Phase WFL Oral - Pudding Teaspoon -- Oral - Pudding Cup -- Oral - Honey Teaspoon -- Oral - Honey Cup -- Oral - Nectar Teaspoon -- Oral - Nectar Cup WFL Oral - Nectar Straw NT Oral - Thin Teaspoon -- Oral - Thin Cup WFL Oral - Thin Straw WFL Oral - Puree WFL Oral - Mech Soft WFL Oral - Regular -- Oral - Multi-Consistency -- Oral - Pill Other (Comment) Oral Phase - Comment --  CHL IP PHARYNGEAL PHASE  06/14/2021 Pharyngeal Phase Impaired Pharyngeal- Pudding Teaspoon -- Pharyngeal -- Pharyngeal- Pudding Cup -- Pharyngeal -- Pharyngeal- Honey Teaspoon -- Pharyngeal -- Pharyngeal- Honey Cup -- Pharyngeal -- Pharyngeal- Nectar Teaspoon -- Pharyngeal -- Pharyngeal- Nectar Cup [facility] Pharyngeal Material does not enter airway Pharyngeal- Nectar Straw -- Pharyngeal -- Pharyngeal- Thin Teaspoon WFL Pharyngeal Material does not enter airway Pharyngeal- Thin Cup Penetration/Aspiration during swallow Pharyngeal Material enters airway, remains ABOVE vocal cords and not ejected out Pharyngeal- Thin Straw Penetration/Aspiration during swallow Pharyngeal Material enters airway, remains ABOVE vocal cords and not ejected out Pharyngeal- Puree WFL Pharyngeal Material does not enter airway Pharyngeal- Mechanical Soft WFL Pharyngeal Material does not enter airway Pharyngeal- Regular -- Pharyngeal -- Pharyngeal- Multi-consistency -- Pharyngeal -- Pharyngeal- Pill WFL Pharyngeal Material does not enter airway Pharyngeal Comment --  Kathleen Lime, MS [facility] SLP Acute Rehab Services Office [phone] Pager [phone] No flowsheet data found. Macario Golds 06/14/2021, 1:20 PM              DG Swallowing Func-Speech Pathology  Final Result    VAS [place] LOWER EXTREMITY VENOUS (DVT)  Final Result    CT Angio Chest PE W and/or Wo Contrast  Final Result    DG Chest Port 1 View  Final Result    CT Head Wo Contrast  Final Result      Scheduled Meds:  Apremilast  30 mg Oral Daily   atorvastatin  10 mg Oral Daily   chlorhexidine  15 mL Mouth Rinse BID   Chlorhexidine Gluconate Cloth  6 each Topical Q0600   docusate sodium  100 mg Oral  BID   doxycycline  50 mg Oral Daily   finasteride  5 mg Oral Daily   heparin  5,000 Units Subcutaneous Q8H   insulin aspart  0-5 Units Subcutaneous QHS   insulin aspart  0-9 Units Subcutaneous TID WC   mouth rinse  15 mL Mouth Rinse q12n4p   pantoprazole  80 mg Oral Daily   tamsulosin  0.8 mg  Oral Daily   umeclidinium bromide  1 puff Inhalation Daily   PRN Meds: acetaminophen **OR** acetaminophen, ondansetron **OR** ondansetron (ZOFRAN) IV, traMADol Continuous Infusions:   LOS: 4 days  Time spent: Greater than 50% of the 35 minute visit was spent in counseling/coordination of care for the patient as laid out in the A&P.   Dwyane Dee, MD [facility] 06/15/2021, 2:37 PM

## 2021-06-15 NOTE — Progress Notes (Signed)
Occupational Therapy Treatment Patient Details Name: [name] Small MRN: [id number] DOB: Nov 24, 1927 Today's Date: 06/15/2021    History of present illness [name] Small is a 87yo w/ a hx of nonobstructive CAD (cath 2012), PAD, CKD stage 3, HTN, DM2, and bladder CA who presented to the ED 06/11/21 w/ the acute onset of generalized weakness and hypoxia,CTA revealed no large PE, but did note R > LLL infiltrates suggestive of a possible aspiration. His resp difficutly worsened during his ER stay, requiring use of BiPAP to maintain sats at >90%.   OT comments  Patient motivated to get out of bed this morning. Supervision level for safety and line management to sit up out of bed. Patient needing increased time with initial R lean while trying to push up from bed to stand. Once stabilized able to ambulate with min A x1 and rolling walker with chair follow for safety. Patient with limited eccentric control needing min A to sit and cue to reach back with hand. Patient set up for g/h in chair. Patient progressing towards OT goals, acute OT to follow.    Follow Up Recommendations  Home health OT;Supervision/Assistance - 24 hour    Equipment Recommendations  None recommended by OT       Precautions / Restrictions Precautions Precautions: Fall Precaution Comments: monitor sats, L trans met amputation. wife to bring in shoes although patient reports  that he likes the socks       Mobility Bed Mobility Overal bed mobility: Modified Independent Bed Mobility: Supine to Sit     Supine to sit: Supervision;HOB elevated          Transfers Overall transfer level: Needs assistance Equipment used: Rolling walker (2 wheeled) Transfers: Sit to/from Stand Sit to Stand: Min assist;+2 physical assistance;+2 safety/equipment         General transfer comment: R lateral lean initially with powering up to standing, then able to ambulate with min A x1 and chair follow    Balance Overall balance  assessment: Needs assistance Sitting-balance support: Feet supported Sitting balance-Leahy Scale: Good     Standing balance support: Bilateral upper extremity supported;During functional activity Standing balance-Leahy Scale: Poor Standing balance comment: reliant on UE support                           ADL either performed or assessed with clinical judgement   ADL Overall ADL's : Needs assistance/impaired     Grooming: Wash/dry face;Wash/dry hands;Set up;Sitting               Lower Body Dressing: Total assistance;Bed level Lower Body Dressing Details (indicate cue type and reason): to don socks, has assist from spouse for LB dressing at baseline Toilet Transfer: Minimal assistance;+2 for safety/equipment;Ambulation;RW Toilet Transfer Details (indicate cue type and reason): min A x2 to power up to standing with initial R lateral lean with powering up to standing. Able to stabilize to min A x1 to ambulate with walker and chair follow. needs assist for eccentric control         Functional mobility during ADLs: Minimal assistance;+2 for safety/equipment;Rolling walker;Cueing for safety        Cognition Arousal/Alertness: Awake/alert Behavior During Therapy: WFL for tasks assessed/performed Overall Cognitive Status: Within Functional Limits for tasks assessed  Pertinent Vitals/ Pain       Pain Assessment: Faces Faces Pain Scale: No hurt Pain Location: back of head         Frequency  Min 2X/week        Progress Toward Goals  OT Goals(current goals can now be found in the care plan section)  Progress towards OT goals: Progressing toward goals  Acute Rehab OT Goals Patient Stated Goal: go home OT Goal Formulation: With patient Time For Goal Achievement: 06/28/21 Potential to Achieve Goals: Good ADL Goals Pt Will Transfer to Toilet: with supervision;ambulating  (walker) Additional ADL Goal #1: Patient will tolerate 5 minutes standing activity at supervision level in order to participate in self care tasks.  Plan Discharge plan remains appropriate    Co-evaluation    PT/OT/SLP Co-Evaluation/Treatment: Yes Reason for Co-Treatment: To address functional/ADL transfers;For patient/therapist safety PT goals addressed during session: Mobility/safety with mobility OT goals addressed during session: ADL's and self-care      AM-PAC OT "6 Clicks" Daily Activity     Outcome Measure   Help from another person eating meals?: A Little Help from another person taking care of personal grooming?: A Little Help from another person toileting, which includes using toliet, bedpan, or urinal?: A Lot Help from another person bathing (including washing, rinsing, drying)?: A Lot Help from another person to put on and taking off regular upper body clothing?: A Little Help from another person to put on and taking off regular lower body clothing?: Total 6 Click Score: 14    End of Session Equipment Utilized During Treatment: Gait belt;Rolling walker  OT Visit Diagnosis: Unsteadiness on feet (R26.81);Other abnormalities of gait and mobility (R26.89);Muscle weakness (generalized) (M62.81)   Activity Tolerance Patient tolerated treatment well   Patient Left in chair;with call bell/phone within reach;with chair alarm set;with family/visitor present   Nurse Communication Mobility status        Time: 0725-0750 OT Time Calculation (min): 25 min  Charges: OT General Charges $OT Visit: 1 Visit OT Treatments $Self Care/Home Management : 8-22 mins  Delbert Phenix OT OT pager: [place] 06/15/2021, 1:19 PM

## 2021-06-15 NOTE — TOC Progression Note (Addendum)
Transition of Care [facility]) - Progression Note    Patient Details  Name: [name] Small MRN: [id number] Date of Birth: 1928/10/15  [facility]) CM/SW Contact  Ross Ludwig, [place] Phone Number: 06/15/2021, 5:37 PM  Clinical Narrative:     CSW spoke to patient's wife, patient currently has a home health aide through [place] [facility].  PT recommending HH CSW spoke to wife, she would like HH PT, RN, SW, and Aide, but not OT.  Per patient's wife he is chronic on oxygen, and has oxygen at home.  Patient's wife requested [facility] for home health services, CSW spoke to [place] at [facility] and they agreed to accept patient.  Patient's wife also requested palliative outpatient services, CSW contacted [facility], they were open to him last year, and agreed to accept him again.  CSW to continue to follow patient's progress throughout discharge planning.   Expected Discharge Plan: Home/Self Care Barriers to Discharge: Continued Medical Work up  Expected Discharge Plan and Services Expected Discharge Plan: Home/Self Care   Discharge Planning Services: CM Consult   Living arrangements for the past 2 months: Single Family Home                                       Social Determinants of Health (SDOH) Interventions    Readmission Risk Interventions Readmission Risk Prevention Plan 04/21/2020  Transportation Screening Complete  PCP or Specialist Appt within 5-7 Days Complete  Home Care Screening Complete  Medication Review (RN CM) Complete  Some recent data might be hidden

## 2021-06-15 NOTE — Progress Notes (Signed)
Inpatient Diabetes Program Recommendations  AACE/ADA: New Consensus Statement on Inpatient Glycemic Control (2015)  Target Ranges:  Prepandial:   less than 140 mg/dL      Peak postprandial:   less than 180 mg/dL (1-2 hours)      Critically ill patients:  140 - 180 mg/dL   Lab Results  Component Value Date   GLUCAP 220 (H) 06/15/2021   HGBA1C 8.2 (H) 06/12/2021    Review of Glycemic Control Results for [name] Small, [name] Small (MRN [id number]) as of 06/15/2021 12:11  Ref. Range 06/14/2021 07:36 06/14/2021 11:57 06/14/2021 18:27 06/14/2021 21:02 06/15/2021 04:39 06/15/2021 08:12 06/15/2021 12:01  Glucose-Capillary Latest Ref Range: 70 - 99 mg/dL 137 (H) 267 (H) 319 (H) 152 (H) 117 (H) 148 (H) 220 (H)   Diabetes history: DM 2 Outpatient Diabetes medications: Lantus 15 units  Current orders for Inpatient glycemic control:  Novolog 0-9 units tid + hs  Inpatient Diabetes Program Recommendations:   Glucose trends increase after meal intake - pt may benefit from Novolog 2 units tid meal coverage if eating >50% of meal coverage.  Thanks,  Tama Headings RN, MSN, BC-ADM Inpatient Diabetes Coordinator Team Pager [phone] (8a-5p)

## 2021-06-15 NOTE — Progress Notes (Signed)
[facility] ([facility])  Hospital Liaison: RN note         This patient has been referred to our palliative care services in the community.  [facility] will continue to follow for any discharge planning needs and to coordinate continuation of palliative care in the outpatient setting.    If you have questions or need assistance, please call [phone] or contact the hospital Liaison listed on AMION.      Thank you for this referral.         Mary Anne Robertson, RN, CCM  [facility] Hospital Liaison   [phone] 

## 2021-06-16 LAB — GLUCOSE, CAPILLARY
Glucose-Capillary: 243 mg/dL — ABNORMAL HIGH (ref 70–99)
Glucose-Capillary: 330 mg/dL — ABNORMAL HIGH (ref 70–99)

## 2021-06-16 NOTE — Discharge Summary (Signed)
Physician Discharge Summary   DINARI STGERMAINE UEA:[id number] DOB: 1928-02-22 DOA: 06/11/2021  PCP: Tonia Ghent, MD  Admit date: 06/11/2021 Discharge date: 06/16/2021   Admitted From: home Disposition:  home with [facility] Discharging physician: Dwyane Dee, MD  Recommendations for Outpatient Follow-up:  Follow up with cardiology   Home Health: PT, RN, aide, SW Equipment/Devices: Home O2, 2.5 L  Patient discharged to home in Discharge Condition: stable Risk of unplanned readmission score: Unplanned Admission- Pilot do not use: 19.28  CODE STATUS: DNR Diet recommendation:  Diet Orders (From admission, onward)     Start     Ordered   06/16/21 0000  Diet - low sodium heart healthy        06/16/21 1121   06/12/21 0804  Diet Heart Room service appropriate? Yes; Fluid consistency: Thin  Diet effective now       Question Answer Comment  Room service appropriate? Yes   Fluid consistency: Thin      06/12/21 0804            Hospital Course: WYLDER MACOMBER is a 87yo w/ a hx of nonobstructive CAD (cath 2012), PAD, CKD stage 3, HTN, DM2, and bladder CA who presented to the ED w/ the acute onset of generalized weakness and hypoxia. He requires nocturnal 2L [place] O2 support at baseline, but noted sats in the 80s the morning of admission. He was feeling fatigued in association with this finding. He admitted to not feeling well since at least 05/31/21 with poor intake of solids or liquids, generalized weakness, and decreased UOP (via his usual I/O caths).   In the ER he was noted to have elevated troponins and an elevated d-dimer. CTA revealed no large PE, but did note R > LLL infiltrates suggestive of a possible aspiration. His resp difficutly worsened during his ER stay, requiring use of BiPAP to maintain sats at >90%.   Patient was weaned off BiPAP to [place] He remained lethargic with poor oral intake during hospitalization.  His wife elected for having palliative care come discuss further goals  of care and to assist with possibly obtaining more help in the home at time of discharge. He was evaluated by palliative care during hospitalization.  Home health and palliative care were also continued at time of discharge.  * Acute respiratory failure with hypoxia and hypercapnia (HCC) -Suspected combination of aspiration pneumonitis, diastolic heart failure -ABG 7.297 / 78  -Off BiPAP -remained stable on 2.5 L oxygen during hospitalization.  This was continued at discharge and wife also informed that he may need increase in oxygen flow when exerting himself or up and walking then can be turned back down to baseline when resting  Aspiration pneumonia ([place]) -Hold off on antibiotics  Goals of care, counseling/discussion - Palliative care consulted per family request - Patient has been less mobile at home recently but appears to be motivated some for having physical therapy come to the house.  Wife is against patient going to rehab due to prior bad experience - max HH to be ordered in anticipation of d/c home  Chronic cystitis - on chronic doxycycline   Chronic kidney disease, stage 3b (HCC) -Baseline Cr ~1.8-2.3  -Stable  Demand ischemia ([place]) -Likely demand ischemia -IV heparin discontinued  Hyperlipidemia - Continue Lipitor  Acute on chronic diastolic CHF (congestive heart failure) (HCC) -BNP 304.5 -Echo EF 55-60%, grade 2 dd -Outpatient follow-up with cardiology  DMII (diabetes mellitus, type 2) (HCC) -Ha1c 8.2 -Continue sliding scale insulin  The patient's chronic medical conditions were treated accordingly per the patient's home medication regimen except as noted.  On day of discharge, patient was felt deemed stable for discharge. Patient/family member advised to call PCP or come back to ER if needed.   Principal Diagnosis: Acute respiratory failure with hypoxia and hypercapnia [facility])  Discharge Diagnoses: Active Hospital Problems   Diagnosis Date Noted   Acute  respiratory failure with hypoxia and hypercapnia (HCC)     Priority: High   Aspiration pneumonia ([place]) 06/13/2021    Priority: Medium   Goals of care, counseling/discussion 06/14/2021   Demand ischemia ([place]) 06/13/2021   Chronic kidney disease, stage 3b ([place]) 06/13/2021   Chronic cystitis 06/13/2021   Hyperlipidemia 01/31/2015   Acute on chronic diastolic CHF (congestive heart failure) ([place]) 01/21/2014   PSORIASIS, SCALP 10/25/2008   DMII (diabetes mellitus, type 2) ([place]) 05/21/2007   BPH (benign prostatic hyperplasia) 05/21/2007    Resolved Hospital Problems  No resolved problems to display.    Discharge Instructions     Diet - low sodium heart healthy   Complete by: As directed    Discharge wound care:   Complete by: As directed    Off load weight to lower back as able   Increase activity slowly   Complete by: As directed       Allergies as of 06/16/2021       Reactions   Actos [pioglitazone Hydrochloride] Other (See Comments)   Held 2012 bladder cancer   Aspirin Other (See Comments)   Held 2012 due to hematuria and bruising.    Ace Inhibitors Cough   Bactrim Rash, Other (See Comments)   Rash, presumed allergy   Gabapentin Nausea And Vomiting   Upset stomach and diarrhea - tolerates low doses   Lyrica [pregabalin] Swelling   swelling   Pravastatin Swelling   Swelling of feet   Sulfa Drugs Cross Reactors Rash        Medication List     TAKE these medications    acetaminophen 325 MG tablet Commonly known as: TYLENOL Take 1-2 tablets (325-650 mg total) by mouth every 4 (four) hours as needed for mild pain.   Apremilast 30 MG Tabs Take 1 tablet by mouth daily.   atorvastatin 10 MG tablet Commonly known as: LIPITOR Take 1 tablet (10 mg total) by mouth daily.   clobetasol 0.05 % external solution Commonly known as: TEMOVATE Apply 1 application topically daily as needed (rash).   CRANBERRY PO Take 4 capsules by mouth daily. Takes AZO 2 tabs BID    docusate sodium 100 MG capsule Commonly known as: COLACE Take 100 mg by mouth daily as needed for mild constipation.   doxycycline 50 MG tablet Commonly known as: ADOXA Take 50 mg by mouth daily.   finasteride 5 MG tablet Commonly known as: PROSCAR Take 5 mg by mouth daily.   fluticasone-salmeterol 250-50 MCG/ACT Aepb Commonly known as: ADVAIR Take 1 puff by mouth 2 (two) times daily as needed (shortness of breath).   insulin glargine 100 UNIT/ML Solostar Pen Commonly known as: LANTUS Inject 15 Units into the skin daily.   multivitamin with minerals Tabs tablet Take 1 tablet by mouth daily.   omeprazole 40 MG capsule Commonly known as: PRILOSEC TAKE 1 CAPSULE (40 MG TOTAL) BY MOUTH DAILY.   tamsulosin 0.4 MG Caps capsule Commonly known as: FLOMAX TAKE 2 CAPSULES BY MOUTH DAILY AFTER SUPPER. What changed: See the new instructions.   tiotropium 18 MCG inhalation capsule Commonly  known as: SPIRIVA Place 18 mcg into inhaler and inhale daily.   traMADol 50 MG tablet Commonly known as: ULTRAM Take 1 tablet (50 mg total) by mouth every 8 (eight) hours as needed for moderate pain or severe pain.   traZODone 50 MG tablet Commonly known as: DESYREL Take 0.5 tablets (25 mg total) by mouth at bedtime as needed for sleep.   Vitamin D 125 MCG (5000 UT) Caps Take 1 capsule by mouth daily. What changed: how much to take               Discharge Care Instructions  (From admission, onward)           Start     Ordered   06/16/21 0000  Discharge wound care:       Comments: Off load weight to lower back as able   06/16/21 1121            Follow-up Information     [facility] Follow up.   Specialty: Cardiology Why: Hospital follow-up with Cardiology scheduled for 07/20/2021 at 2:30pm with Cadence Kathlen Mody, one of Dr. Tyrell Antonio PAs. Please arrive 15 minutes early for check-in. If this date/time does not work for you, please call our office to  reschedule. Contact information: [address] [place] [phone]               Allergies  Allergen Reactions   Actos [Pioglitazone Hydrochloride] Other (See Comments)    Held 2012 bladder cancer   Aspirin Other (See Comments)    Held 2012 due to hematuria and bruising.    Ace Inhibitors Cough   Bactrim Rash and Other (See Comments)    Rash, presumed allergy   Gabapentin Nausea And Vomiting    Upset stomach and diarrhea - tolerates low doses   Lyrica [Pregabalin] Swelling    swelling   Pravastatin Swelling    Swelling of feet   Sulfa Drugs Cross Reactors Rash    Consultations: Palliative care  Discharge Exam: BP 132/63 (BP Location: Left Arm)   Pulse 67   Temp 98.6 F (37 C)   Resp 18   Ht 6\' 2"  (1.88 m)   Wt 95.3 kg   SpO2 97%   BMI 26.96 kg/m  General appearance: cooperative and no distress Head: Normocephalic, without obvious abnormality, atraumatic Eyes:  EOMI Lungs:  Subtle crackles otherwise relatively clear Heart: regular rate and rhythm and S1, S2 normal Abdomen: normal findings: bowel sounds normal and soft, non-tender Extremities:  No edema Skin: mobility and turgor normal Neurologic: Grossly normal  The results of significant diagnostics from this hospitalization (including imaging, microbiology, ancillary and laboratory) are listed below for reference.   Microbiology: Recent Results (from the past 240 hour(s))  Resp Panel by RT-PCR (Flu A&B, Covid) Nasopharyngeal Swab     Status: None   Collection Time: 06/11/21  8:48 AM   Specimen: Nasopharyngeal Swab; Nasopharyngeal(NP) swabs in vial transport medium  Result Value Ref Range Status   SARS Coronavirus 2 by RT PCR NEGATIVE NEGATIVE Final    Comment: (NOTE) SARS-CoV-2 target nucleic acids are NOT DETECTED.  The SARS-CoV-2 RNA is generally detectable in upper respiratory specimens during the acute phase of infection. The lowest concentration of  SARS-CoV-2 viral copies this assay can detect is 138 copies/mL. A negative result does not preclude SARS-Cov-2 infection and should not be used as the sole basis for treatment or other patient management decisions. A negative result may occur  with  improper specimen collection/handling, submission of specimen other than nasopharyngeal swab, presence of viral mutation(s) within the areas targeted by this assay, and inadequate number of viral copies(<138 copies/mL). A negative result must be combined with clinical observations, patient history, and epidemiological information. The expected result is Negative.  Fact Sheet for Patients:  EntrepreneurPulse.com.au  Fact Sheet for Healthcare Providers:  IncredibleEmployment.be  This test is no t yet approved or cleared by the [place] FDA and  has been authorized for detection and/or diagnosis of SARS-CoV-2 by FDA under an Emergency Use Authorization (EUA). This EUA will remain  in effect (meaning this test can be used) for the duration of the COVID-19 declaration under Section 564(b)(1) of the Act, 21 U.S.C.section 360bbb-3(b)(1), unless the authorization is terminated  or revoked sooner.       Influenza A by PCR NEGATIVE NEGATIVE Final   Influenza B by PCR NEGATIVE NEGATIVE Final    Comment: (NOTE) The Xpert Xpress SARS-CoV-2/FLU/RSV plus assay is intended as an aid in the diagnosis of influenza from Nasopharyngeal swab specimens and should not be used as a sole basis for treatment. Nasal washings and aspirates are unacceptable for Xpert Xpress SARS-CoV-2/FLU/RSV testing.  Fact Sheet for Patients: EntrepreneurPulse.com.au  Fact Sheet for Healthcare Providers: IncredibleEmployment.be  This test is not yet approved or cleared by the [place] FDA and has been authorized for detection and/or diagnosis of SARS-CoV-2 by FDA under an Emergency Use  Authorization (EUA). This EUA will remain in effect (meaning this test can be used) for the duration of the COVID-19 declaration under Section 564(b)(1) of the Act, 21 U.S.C. section 360bbb-3(b)(1), unless the authorization is terminated or revoked.  Performed at [facility], [place] [address] [place], [place] [address]   MRSA Next Gen by PCR, Nasal     Status: None   Collection Time: 06/14/21 11:20 AM   Specimen: Nasal Mucosa; Nasal Swab  Result Value Ref Range Status   MRSA by PCR Next Gen NOT DETECTED NOT DETECTED Final    Comment: (NOTE) The GeneXpert MRSA Assay (FDA approved for NASAL specimens only), is one component of a comprehensive MRSA colonization surveillance program. It is not intended to diagnose MRSA infection nor to guide or monitor treatment for MRSA infections. Test performance is not FDA approved in patients less than 22 years old. Performed at [facility], [place] [address], [place], [place] [address]      Labs: BNP (last 3 results) Recent Labs    06/11/21 0732  BNP 678.9*   Basic Metabolic Panel: Recent Labs  Lab 06/11/21 0732 06/12/21 0515 06/13/21 0235 06/14/21 0852 06/15/21 0452  NA 142 142 138 137 137  K 4.9 4.3 4.0 4.4 3.9  CL 98 93* 89* 88* 92*  CO2 37* 39* 38* 43* 37*  GLUCOSE 161* 50* 110* 135* 125*  BUN 41* 38* 37* 33* 36*  CREATININE 1.74* 1.54* 1.54* 1.54* 1.62*  CALCIUM 9.1 8.7* 8.4* 8.6* 8.4*  MG  --   --   --  2.0 2.0   Liver Function Tests: Recent Labs  Lab 06/11/21 0732 06/12/21 0515  AST 12* 11*  ALT 9 8  ALKPHOS 52 54  BILITOT 0.7 1.0  PROT 6.7 6.4*  ALBUMIN 3.6 3.3*   No results for input(s): LIPASE, AMYLASE in the last 168 hours. No results for input(s): AMMONIA in the last 168 hours. CBC: Recent Labs  Lab 06/11/21 0732 06/12/21 0515 06/13/21 0235 06/14/21 0852 06/15/21 0452  WBC  7.2 8.0 7.1 6.6 6.8  NEUTROABS  --   --   --  4.3 4.2  HGB 15.1 14.6 14.8 15.1 14.5  HCT  49.5 47.7 47.9 48.6 45.9  MCV 108.3* 107.7* 105.7* 105.0* 103.6*  PLT 158 163 158 171 160   Cardiac Enzymes: No results for input(s): CKTOTAL, CKMB, CKMBINDEX, TROPONINI in the last 168 hours. BNP: Invalid input(s): POCBNP CBG: Recent Labs  Lab 06/15/21 1201 06/15/21 1714 06/15/21 2052 06/16/21 0835 06/16/21 1129  GLUCAP 220* 278* 335* 243* 330*   D-Dimer No results for input(s): DDIMER in the last 72 hours. Hgb A1c No results for input(s): HGBA1C in the last 72 hours. Lipid Profile No results for input(s): CHOL, HDL, LDLCALC, TRIG, CHOLHDL, LDLDIRECT in the last 72 hours. Thyroid function studies No results for input(s): TSH, T4TOTAL, T3FREE, THYROIDAB in the last 72 hours.  Invalid input(s): FREET3 Anemia work up No results for input(s): VITAMINB12, FOLATE, FERRITIN, TIBC, IRON, RETICCTPCT in the last 72 hours. Urinalysis    Component Value Date/Time   COLORURINE YELLOW 07/31/2020 1659   APPEARANCEUR TURBID (A) 07/31/2020 1659   LABSPEC 1.012 07/31/2020 1659   PHURINE 6.0 07/31/2020 1659   GLUCOSEU NEGATIVE 07/31/2020 1659   GLUCOSEU 250 (A) 06/09/2020 1331   HGBUR MODERATE (A) 07/31/2020 1659   HGBUR trace-intact 11/25/2007 0937   BILIRUBINUR NEGATIVE 07/31/2020 1659   KETONESUR NEGATIVE 07/31/2020 1659   PROTEINUR 100 (A) 07/31/2020 1659   UROBILINOGEN 0.2 06/09/2020 1331   NITRITE NEGATIVE 07/31/2020 1659   LEUKOCYTESUR MODERATE (A) 07/31/2020 1659   Sepsis Labs Invalid input(s): PROCALCITONIN,  WBC,  LACTICIDVEN Microbiology Recent Results (from the past 240 hour(s))  Resp Panel by RT-PCR (Flu A&B, Covid) Nasopharyngeal Swab     Status: None   Collection Time: 06/11/21  8:48 AM   Specimen: Nasopharyngeal Swab; Nasopharyngeal(NP) swabs in vial transport medium  Result Value Ref Range Status   SARS Coronavirus 2 by RT PCR NEGATIVE NEGATIVE Final    Comment: (NOTE) SARS-CoV-2 target nucleic acids are NOT DETECTED.  The SARS-CoV-2 RNA is generally  detectable in upper respiratory specimens during the acute phase of infection. The lowest concentration of SARS-CoV-2 viral copies this assay can detect is 138 copies/mL. A negative result does not preclude SARS-Cov-2 infection and should not be used as the sole basis for treatment or other patient management decisions. A negative result may occur with  improper specimen collection/handling, submission of specimen other than nasopharyngeal swab, presence of viral mutation(s) within the areas targeted by this assay, and inadequate number of viral copies(<138 copies/mL). A negative result must be combined with clinical observations, patient history, and epidemiological information. The expected result is Negative.  Fact Sheet for Patients:  EntrepreneurPulse.com.au  Fact Sheet for Healthcare Providers:  IncredibleEmployment.be  This test is no t yet approved or cleared by the [place] FDA and  has been authorized for detection and/or diagnosis of SARS-CoV-2 by FDA under an Emergency Use Authorization (EUA). This EUA will remain  in effect (meaning this test can be used) for the duration of the COVID-19 declaration under Section 564(b)(1) of the Act, 21 U.S.C.section 360bbb-3(b)(1), unless the authorization is terminated  or revoked sooner.       Influenza A by PCR NEGATIVE NEGATIVE Final   Influenza B by PCR NEGATIVE NEGATIVE Final    Comment: (NOTE) The Xpert Xpress SARS-CoV-2/FLU/RSV plus assay is intended as an aid in the diagnosis of influenza from Nasopharyngeal swab specimens and should not be used as a  sole basis for treatment. Nasal washings and aspirates are unacceptable for Xpert Xpress SARS-CoV-2/FLU/RSV testing.  Fact Sheet for Patients: EntrepreneurPulse.com.au  Fact Sheet for Healthcare Providers: IncredibleEmployment.be  This test is not yet approved or cleared by the [place] FDA  and has been authorized for detection and/or diagnosis of SARS-CoV-2 by FDA under an Emergency Use Authorization (EUA). This EUA will remain in effect (meaning this test can be used) for the duration of the COVID-19 declaration under Section 564(b)(1) of the Act, 21 U.S.C. section 360bbb-3(b)(1), unless the authorization is terminated or revoked.  Performed at [facility], [place] [address], [place], [place] [address]   MRSA Next Gen by PCR, Nasal     Status: None   Collection Time: 06/14/21 11:20 AM   Specimen: Nasal Mucosa; Nasal Swab  Result Value Ref Range Status   MRSA by PCR Next Gen NOT DETECTED NOT DETECTED Final    Comment: (NOTE) The GeneXpert MRSA Assay (FDA approved for NASAL specimens only), is one component of a comprehensive MRSA colonization surveillance program. It is not intended to diagnose MRSA infection nor to guide or monitor treatment for MRSA infections. Test performance is not FDA approved in patients less than 17 years old. Performed at [facility], [place] [address] [place],  [address]     Procedures/Studies: CT Head Wo Contrast  Result Date: 06/11/2021 CLINICAL DATA:  Mental status changes. EXAM: CT HEAD WITHOUT CONTRAST TECHNIQUE: Contiguous axial images were obtained from the base of the skull through the vertex without intravenous contrast. COMPARISON:  04/24/2020 FINDINGS: Brain: There is atrophy and chronic small vessel disease changes. No acute intracranial abnormality. Specifically, no hemorrhage, hydrocephalus, mass lesion, acute infarction, or significant intracranial injury. Vascular: No hyperdense vessel or unexpected calcification. Skull: No acute calvarial abnormality. Sinuses/Orbits: No acute findings Other: None IMPRESSION: Atrophy, chronic microvascular disease. No acute intracranial abnormality. Electronically Signed   By: Rolm Baptise M.D.   On: 06/11/2021 08:11   CT Angio Chest PE W and/or Wo  Contrast  Result Date: 06/11/2021 CLINICAL DATA:  PE suspected EXAM: CT ANGIOGRAPHY CHEST WITH CONTRAST TECHNIQUE: Multidetector CT imaging of the chest was performed using the standard protocol during bolus administration of intravenous contrast. Multiplanar CT image reconstructions and MIPs were obtained to evaluate the vascular anatomy. CONTRAST:  61mL OMNIPAQUE IOHEXOL 350 MG/ML SOLN COMPARISON:  CT chest dated Apr 06, 2005 FINDINGS: Cardiovascular: Normal heart size. No pericardial effusion. Three-vessel coronary artery calcifications. Atherosclerotic disease of the thoracic aorta. No evidence of pulmonary embolus to the level of the proximal segmental pulmonary arteries. Adequate contrast opacification of the pulmonary arteries. Evaluation of the more distal pulmonary arteries is limited due to respiratory motion and streak artifact. Mediastinum/Nodes: No enlarged mediastinal, hilar, or axillary lymph nodes. Thyroid gland, trachea, and esophagus demonstrate no significant findings. Lungs/Pleura: Small right-greater-than-left pleural effusions with associated atelectasis centrilobular emphysema. Mild bronchial wall thickening, most pronounced in the lower lungs. Clustered nodules of the right lower lobe seen on series 3 image 259. Linear opacities in the right lower lobe, likely due to scarring or atelectasis. No consolidation, pleural effusion or pneumothorax. Central airways are patent. Upper Abdomen: No acute abnormality. Musculoskeletal: No chest wall abnormality. No acute or significant osseous findings. Review of the MIP images confirms the above findings. IMPRESSION: No evidence of pulmonary embolus to the level of the proximal segmental pulmonary arteries. Evaluation of the more distal pulmonary arteries is limited due to respiratory motion and streak artifact. Bilateral lower lobe  predominant bronchial wall thickening and clustered nodules of the right lower lobe, likely sequela of aspiration.  Electronically Signed   By: Yetta Glassman MD   On: 06/11/2021 11:30   DG Chest Port 1 View  Result Date: 06/11/2021 CLINICAL DATA:  Shortness of breath. EXAM: PORTABLE CHEST 1 VIEW COMPARISON:  04/18/2020 FINDINGS: Heart size is normal. Bilateral lower lobe opacities identified concerning for atelectasis or pneumonia. Pulmonary vascular congestion without overt edema. No pleural effusions identified. IMPRESSION: Bilateral lower lobe opacities concerning for atelectasis or pneumonia. Electronically Signed   By: Kerby Moors M.D.   On: 06/11/2021 08:35   DG Swallowing Func-Speech Pathology  Result Date: 06/14/2021 Formatting of this result is different from the original. Objective Swallowing Evaluation: Type of Study: MBS-Modified Barium Swallow Study  Patient Details Name: SHMIEL MORTON MRN: [id number] Date of Birth: 12-23-1927 Today's Date: 06/14/2021 Time: SLP Start Time (ACUTE ONLY): 8527 -SLP Stop Time (ACUTE ONLY): 1301 SLP Time Calculation (min) (ACUTE ONLY): 26 min Past Medical History: Past Medical History: Diagnosis Date  (HFpEF) heart failure with preserved ejection fraction (HCC)   Arthritis   Back pain   s/p lumbar injection 2014  BENIGN PROSTATIC HYPERTROPHY 05/21/2007  Bladder cancer ([place]) 10/2011  CAD (coronary artery disease)   nonobstructive by cath 6/12:  mid LAD 30%, proximal obtuse marginal-2 30%, proximal RCA 20%, mid RCA 30-40%.  He had normal cardiac output and mildly elevated filling pressures but no significant pulmonary hypertension;   Echocardiogram in May 2012 demonstrated EF 50-55% and left atrial enlargement   Cellulitis of left foot   Hospitalized in 2006  Chronic kidney disease (CKD), stage III (moderate) (HCC) 12/04/2007  FOLLOWED BY DR PATEL  Chronic pain of lower extremity   Complication of anesthesia   1 time bladder cancer surgery- 2012 , oxygen satruratioon dropped had to stay overnight, no problems since then.  COPD (chronic obstructive pulmonary disease) ([place])    DIABETES MELLITUS, TYPE II 05/21/2007  DIVERTICULOSIS, COLON 05/21/2007  pt denies  Dyspnea   with exertion, patient mointors saturation  Fatigue AGE-RELATED  Gross hematuria   pt denies  HYPERTENSION 05/21/2007  Impaired hearing BILATERAL HEARING AIDS  On home oxygen therapy   as needed  PAD (peripheral artery disease) ([place])   a. 02/2016 L foot nonhealing ulcer-->Periph angio: L pop 100 w/ reconstitution via extensive vollats to the prox-mid peroneal (only patent vessel BK)-->PTA of L Pop and peroneal; b. Ongoing LE ischemia 5 & 06/2016 req amputation of toes on L foot.  Pneumonia   Hx  of   Psoriasis ELBOWS  Psoriasis   PSORIASIS, SCALP 10/25/2008 Past Surgical History: Past Surgical History: Procedure Laterality Date  AMPUTATION Left 03/27/2016  Procedure: partial first AMPUTATION RAY; LEFT;  Surgeon: Trula Slade, DPM;  Location: [facility];  Service: Podiatry;  Laterality: Left;  AMPUTATION Left 06/12/2016  Procedure: TRANSMETATARSAL AMPUTATION LEFT FOOT;  Surgeon: Newt Minion, MD;  Location: [facility];  Service: Orthopedics;  Laterality: Left;  APPENDECTOMY  1941  BIOPSY  05/16/2020  Procedure: BIOPSY;  Surgeon: Lavena Bullion, DO;  Location: [facility];  Service: Gastroenterology;;  CARDIAC CATHETERIZATION  04-24-11/  DR Rogue Jury ARIDA  MILD NONOBSTRUCTIVE CAD, NORMA CARDIAC OUTPUT  CARDIOVASCULAR STRESS TEST  2007  CATARACT EXTRACTION W/ INTRAOCULAR LENS  IMPLANT, BILATERAL Bilateral ~ 2010  CYSTOSCOPY  12/25/2011  Procedure: CYSTOSCOPY;  Surgeon: Molli Hazard, MD;  Location: [facility];  Service: Urology;  Laterality: N/A;  needs intubation and to be  paralyzed   CYSTOSCOPY W/ RETROGRADES  10/30/2011  Procedure: CYSTOSCOPY WITH RETROGRADE PYELOGRAM;  Surgeon: Molli Hazard, MD;  Location[facility];  Service: Urology;  Laterality: Bilateral;  CYSTOSCOPY POSS TURBT BILATERAL RETROGRADE PYLEOGRAM   CYSTOSCOPY W/ RETROGRADES  11/30/2012  Procedure: CYSTOSCOPY WITH  RETROGRADE PYELOGRAM;  Surgeon: Molli Hazard, MD;  Location: [facility];  Service: Urology;  Laterality: Bilateral;  Flexible cystoscopy.  CYSTOSCOPY WITH BIOPSY  11/30/2012  Procedure: CYSTOSCOPY WITH BIOPSY;  Surgeon: Molli Hazard, MD;  Location: [facility];  Service: Urology;  Laterality: N/A;  ESOPHAGOGASTRODUODENOSCOPY (EGD) WITH PROPOFOL N/A 05/16/2020  Procedure: ESOPHAGOGASTRODUODENOSCOPY (EGD) WITH PROPOFOL;  Surgeon: Lavena Bullion, DO;  Location: [facility];  Service: Gastroenterology;  Laterality: N/A;  FEMUR IM NAIL Left 08/01/2020  Procedure: INTRAMEDULLARY (IM) RETROGRADE FEMORAL NAILING;  Surgeon: Altamese Shinglehouse, MD;  Location: [place];  Service: Orthopedics;  Laterality: Left;  INCISION AND DRAINAGE FOOT Left ~ 2005  LEFT FOOT DUE TO INFECTION FROM  NAIL PUNCTURE INJURY  LUMBAR LAMINECTOMY/DECOMPRESSION MICRODISCECTOMY N/A 10/18/2016  Procedure: LEFT AND CENTRAL L4-5 LUMBAR LAMINECTOMY WITH RESECTION OF SYNOVIAL CYST;  Surgeon: Jessy Oto, MD;  Location: [place];  Service: Orthopedics;  Laterality: N/A;  ORIF FEMUR FRACTURE Left 04/19/2020  Procedure: OPEN REDUCTION INTERNAL FIXATION FEMORAL SHAFT FRACTURE;  Surgeon: Shona Needles, MD;  Location: [place];  Service: Orthopedics;  Laterality: Left;  PENILE PROSTHESIS IMPLANT  1990s  PERIPHERAL VASCULAR CATHETERIZATION N/A 02/14/2016  Procedure: Abdominal Aortogram w/Lower Extremity;  Surgeon: [place] Hampshire, MD;  Location: [facility];  Service: Cardiovascular;  Laterality: N/A;  PERIPHERAL VASCULAR CATHETERIZATION Left 02/28/2016  Procedure: Peripheral Vascular Balloon Angioplasty;  Surgeon: Wellington Hampshire, MD;  Location: [facility];  Service: Cardiovascular;  Laterality: Left;  left peroneal and popliteal artery  TONSILLECTOMY AND ADENOIDECTOMY  1962  TOTAL HIP ARTHROPLASTY Left 12/27/2016  Procedure: LEFT TOTAL HIP ARTHROPLASTY ANTERIOR APPROACH;  Surgeon: Mcarthur Rossetti, MD;   [facility]: [facility];  Service: Orthopedics;  Laterality: Left;  TRANSURETHRAL RESECTION OF BLADDER TUMOR  10/30/2011  Procedure: TRANSURETHRAL RESECTION OF BLADDER TUMOR (TURBT);  Surgeon: Molli Hazard, MD;  Location: [facility];  Service: Urology;  Laterality: N/A;  TRANSURETHRAL RESECTION OF BLADDER TUMOR  12/25/2011  Procedure: TRANSURETHRAL RESECTION OF BLADDER TUMOR (TURBT);  Surgeon: Molli Hazard, MD;  Location: [facility];  Service: Urology;  Laterality: N/A;  need long gyrus instruments   VASECTOMY  1990s HPI: pt is a 87 yo male adm to [facility] with SOB, dyspnea - found to have right more than LLL infiltrate.  Pt PMH + fora 87yo w/ a hx of nonobstructive CAD (cath 2012), PAD, CKD stage 3, HTN, DM2, and bladder CA who presented to the ED w/ the acute onset of generalized weakness and hypoxia.  He admitted to not feeling well since at least 05/31/21 with poor intake of solids or liquids, generalized weakness, and decreased UOP (via his usual I/O caths).    Pt was noted to have elevated troponins and an elevated d-dimer per MD notes. CTA revealed no large PE, but did note R > LLL infiltrates suggestive of a possible aspiration. His resp difficutly worsened during his ER stay, requiring use of BiPAP to maintain sats at >90%.     Patient was weaned off BiPAP to [place] during hospital coarse.  Swallow evaluation ordered due to concerns for aspiration.  Pt has h/o GI bleed diagnosed  after having coffee ground emesis - -  underwent endoscopy 05/2020 with findings of stomach ulcer and esophagitis.  He was placed on a PPI BID.  Pt denies dysphagia - admits to occasional "choking". Wife reports pt coughing more AFTER eating/drinking than during.  Pt is on 2 liters of oxygen at home at night.  He resides with his wife.  Subjective: pt awake in flouro chair Assessment / Plan / Recommendation CHL IP CLINICAL IMPRESSIONS 06/14/2021 Clinical Impression Patient presents with functional  oropharyngeal swallow ability. No aspiration and minimal penetration of thin liquids (thin barium halved with water) with [facility] for his age.  Pharyngeal swallow is strong without retention.  Barium tablet was not transited with first liquid bolus - tablet spilled to vallecular space and pt conducted dry swallow to transit into esophagus.  Pt did appear with barium tablet halting briefly near mid - esophagus without awareness (? near aortic arch).  Further boluses appeared to transited tablet into esophagus.  He also appeared with significant retrograde propulsion of thin to proximal esophagus without sensation.  Suspect his primary aspiration risk is esophageal and given his lack of senstion mitigation strategies are advised.  Radiologist was not present confirm findings and MBS is diagnostic for oropharyngeal swallow only.  Pt is on a PPI since Summer 2021 and given premorbid GERD diagnoisis, SLP encouraged him to follow precautions. SLP Visit Diagnosis Dysphagia, unspecified (R13.10) Attention and concentration deficit following -- Frontal lobe and executive function deficit following -- Impact on safety and function Mild aspiration risk   No flowsheet data found.  Prognosis 06/14/2021 Prognosis for Safe Diet Advancement Good Barriers to Reach Goals -- Barriers/Prognosis Comment -- CHL IP DIET RECOMMENDATION 06/14/2021 SLP Diet Recommendations Regular solids;Thin liquid Liquid Administration via Cup;Straw Medication Administration Whole meds with puree Compensations Slow rate;Small sips/bites Postural Changes Remain semi-upright after after feeds/meals (Comment);Seated upright at 90 degrees   CHL IP OTHER RECOMMENDATIONS 06/14/2021 Recommended Consults -- Oral Care Recommendations Oral care BID Other Recommendations --   CHL IP FOLLOW UP RECOMMENDATIONS 06/14/2021 Follow up Recommendations None   CHL IP FREQUENCY AND DURATION 06/14/2021 Speech Therapy Frequency (ACUTE ONLY) min 1 x/week Treatment Duration 1 week      CHL IP  ORAL PHASE 06/14/2021 Oral Phase WFL Oral - Pudding Teaspoon -- Oral - Pudding Cup -- Oral - Honey Teaspoon -- Oral - Honey Cup -- Oral - Nectar Teaspoon -- Oral - Nectar Cup WFL Oral - Nectar Straw NT Oral - Thin Teaspoon -- Oral - Thin Cup WFL Oral - Thin Straw WFL Oral - Puree WFL Oral - Mech Soft WFL Oral - Regular -- Oral - Multi-Consistency -- Oral - Pill Other (Comment) Oral Phase - Comment --  CHL IP PHARYNGEAL PHASE 06/14/2021 Pharyngeal Phase Impaired Pharyngeal- Pudding Teaspoon -- Pharyngeal -- Pharyngeal- Pudding Cup -- Pharyngeal -- Pharyngeal- Honey Teaspoon -- Pharyngeal -- Pharyngeal- Honey Cup -- Pharyngeal -- Pharyngeal- Nectar Teaspoon -- Pharyngeal -- Pharyngeal- Nectar Cup [facility] Pharyngeal Material does not enter airway Pharyngeal- Nectar Straw -- Pharyngeal -- Pharyngeal- Thin Teaspoon WFL Pharyngeal Material does not enter airway Pharyngeal- Thin Cup Penetration/Aspiration during swallow Pharyngeal Material enters airway, remains ABOVE vocal cords and not ejected out Pharyngeal- Thin Straw Penetration/Aspiration during swallow Pharyngeal Material enters airway, remains ABOVE vocal cords and not ejected out Pharyngeal- Puree WFL Pharyngeal Material does not enter airway Pharyngeal- Mechanical Soft WFL Pharyngeal Material does not enter airway Pharyngeal- Regular -- Pharyngeal -- Pharyngeal- Multi-consistency -- Pharyngeal -- Pharyngeal- Pill WFL Pharyngeal Material  does not enter airway Pharyngeal Comment --  Kathleen Lime, MS [facility] SLP Acute Rehab Services Office [phone] Pager [phone] No flowsheet data found. Macario Golds 06/14/2021, 1:20 PM              ECHOCARDIOGRAM COMPLETE  Result Date: 06/11/2021    ECHOCARDIOGRAM REPORT   Patient Name:   MARILYN WING Date of Exam: 06/11/2021 Medical Rec #:  [id number]       Height:       74.0 in Accession #:    [id number]      Weight:       210.0 lb Date of Birth:  08-20-1928      BSA:          2.219 m Patient Age:    69 years        BP:            147/101 mmHg Patient Gender: M               HR:           96 bpm. Exam Location:  Inpatient Procedure: 2D Echo, Cardiac Doppler and Color Doppler Indications:    I25-125.9 Ischemic heart disease  History:        Patient has prior history of Echocardiogram examinations, most                 recent 01/18/2014. CHF, Abnormal ECG, COPD, Arrythmias:Atrial                 Fibrillation, Signs/Symptoms:Shortness of Breath and Dyspnea;                 Risk Factors:Diabetes and Hypertension. Hypoxia.  Sonographer:    Roseanna Rainbow RDCS Referring Phys: [id number] ANNA Ree Kida  Sonographer Comments: Technically difficult study due to poor echo windows and suboptimal parasternal window. Image acquisition challenging due to COPD. Patient supine. Patient moving throughout exam. IMPRESSIONS  1. Left ventricular ejection fraction, by estimation, is 55 to 60%. The left ventricle has normal function. The left ventricle has no regional wall motion abnormalities. There is mild left ventricular hypertrophy. Left ventricular diastolic parameters are consistent with Grade II diastolic dysfunction (pseudonormalization). Elevated left atrial pressure.  2. Right ventricular systolic function is normal. The right ventricular size is mildly enlarged. There is moderately elevated pulmonary artery systolic pressure.  3. The mitral valve is normal in structure. No evidence of mitral valve regurgitation. No evidence of mitral stenosis.  4. The aortic valve is tricuspid. Aortic valve regurgitation is not visualized. Mild to moderate aortic valve sclerosis/calcification is present, without any evidence of aortic stenosis. FINDINGS  Left Ventricle: Left ventricular ejection fraction, by estimation, is 55 to 60%. The left ventricle has normal function. The left ventricle has no regional wall motion abnormalities. The left ventricular internal cavity size was normal in size. There is  mild left ventricular hypertrophy. Left ventricular diastolic parameters  are consistent with Grade II diastolic dysfunction (pseudonormalization). Elevated left atrial pressure. Right Ventricle: The right ventricular size is mildly enlarged. Right vetricular wall thickness was not well visualized. Right ventricular systolic function is normal. There is moderately elevated pulmonary artery systolic pressure. The tricuspid regurgitant velocity is 3.27 m/s, and with an assumed right atrial pressure of 8 mmHg, the estimated right ventricular systolic pressure is 41.9 mmHg. Left Atrium: Left atrial size was normal in size. Right Atrium: Right atrial size was normal in size. Pericardium: There is no evidence of pericardial effusion. Mitral Valve: The  mitral valve is normal in structure. No evidence of mitral valve regurgitation. No evidence of mitral valve stenosis. Tricuspid Valve: The tricuspid valve is normal in structure. Tricuspid valve regurgitation is mild. Aortic Valve: The aortic valve is tricuspid. Aortic valve regurgitation is not visualized. Mild to moderate aortic valve sclerosis/calcification is present, without any evidence of aortic stenosis. Pulmonic Valve: The pulmonic valve was not well visualized. Pulmonic valve regurgitation is not visualized. Aorta: The aortic root and ascending aorta are structurally normal, with no evidence of dilitation. IAS/Shunts: The interatrial septum was not well visualized.  LEFT VENTRICLE PLAX 2D LVIDd:         4.50 cm      Diastology LVIDs:         3.40 cm      LV e' medial:    6.53 cm/s LV PW:         1.10 cm      LV E/e' medial:  11.3 LV IVS:        1.15 cm      LV e' lateral:   3.59 cm/s LVOT diam:     2.40 cm      LV E/e' lateral: 20.6 LV SV:         109 LV SV Index:   49 LVOT Area:     4.52 cm  LV Volumes (MOD) LV vol d, MOD A2C: 85.4 ml LV vol d, MOD A4C: 120.0 ml LV vol s, MOD A2C: 31.7 ml LV vol s, MOD A4C: 53.8 ml LV SV MOD A2C:     53.7 ml LV SV MOD A4C:     120.0 ml LV SV MOD BP:      64.7 ml RIGHT VENTRICLE RV S prime:     11.00  cm/s TAPSE (M-mode): 2.3 cm LEFT ATRIUM             Index       RIGHT ATRIUM           Index LA diam:        3.70 cm 1.67 cm/m  RA Area:     18.20 cm LA Vol (A2C):   38.2 ml 17.21 ml/m RA Volume:   52.40 ml  23.61 ml/m LA Vol (A4C):   34.7 ml 15.64 ml/m LA Biplane Vol: 36.3 ml 16.36 ml/m  AORTIC VALVE LVOT Vmax:   101.00 cm/s LVOT Vmean:  68.700 cm/s LVOT VTI:    0.242 m  AORTA Ao Root diam: 3.30 cm Ao Asc diam:  3.60 cm MITRAL VALVE                TRICUSPID VALVE MV Area (PHT): 3.77 cm     TR Peak grad:   42.8 mmHg MV Decel Time: 201 msec     TR Vmax:        327.00 cm/s MV E velocity: 74.10 cm/s MV A velocity: 107.00 cm/s  SHUNTS MV E/A ratio:  0.69         Systemic VTI:  0.24 m                             Systemic Diam: 2.40 cm Oswaldo Milian MD Electronically signed by Oswaldo Milian MD Signature Date/Time: 06/11/2021/4:39:51 PM    Final    VAS [place] LOWER EXTREMITY VENOUS (DVT)  Result Date: 06/12/2021  Lower Venous DVT Study Patient Name:  ADHRIT KRENZ  Date of Exam:   06/12/2021 Medical Rec #:  [id number]        Accession #:    [id number] Date of Birth: January 09, 1928       Patient Gender: M Patient Age:   89Y Exam Location:  [facility] Procedure:      VAS [place] LOWER EXTREMITY VENOUS (DVT) Referring Phys: [place] --------------------------------------------------------------------------------  Indications: Hypoxia and weakness.  Limitations: Scanning environment - patient in room with no blinds and direct sunlight. Performing Technologist: Rogelia Rohrer RVT, RDMS  Examination Guidelines: A complete evaluation includes B-mode imaging, spectral Doppler, color Doppler, and power Doppler as needed of all accessible portions of each vessel. Bilateral testing is considered an integral part of a complete examination. Limited examinations for reoccurring indications may be performed as noted. The reflux portion of the exam is performed with the patient in reverse Trendelenburg.   +---------+---------------+---------+-----------+----------+--------------+ RIGHT    CompressibilityPhasicitySpontaneityPropertiesThrombus Aging +---------+---------------+---------+-----------+----------+--------------+ CFV      Full           Yes      Yes                                 +---------+---------------+---------+-----------+----------+--------------+ SFJ      Full                                                        +---------+---------------+---------+-----------+----------+--------------+ FV Prox  Full           Yes      Yes                                 +---------+---------------+---------+-----------+----------+--------------+ FV Mid   Full           Yes      Yes                                 +---------+---------------+---------+-----------+----------+--------------+ FV DistalFull           Yes      Yes                                 +---------+---------------+---------+-----------+----------+--------------+ PFV      Full                                                        +---------+---------------+---------+-----------+----------+--------------+ POP      Full           Yes      Yes                                 +---------+---------------+---------+-----------+----------+--------------+ PTV      Full                                                        +---------+---------------+---------+-----------+----------+--------------+  PERO     Full                                                        +---------+---------------+---------+-----------+----------+--------------+   +---------+---------------+---------+-----------+----------+--------------+ LEFT     CompressibilityPhasicitySpontaneityPropertiesThrombus Aging +---------+---------------+---------+-----------+----------+--------------+ CFV      Full           Yes      Yes                                  +---------+---------------+---------+-----------+----------+--------------+ SFJ      Full                                                        +---------+---------------+---------+-----------+----------+--------------+ FV Prox  Full           Yes      Yes                                 +---------+---------------+---------+-----------+----------+--------------+ FV Mid   Full           Yes      Yes                                 +---------+---------------+---------+-----------+----------+--------------+ FV DistalFull           Yes      Yes                                 +---------+---------------+---------+-----------+----------+--------------+ PFV      Full                                                        +---------+---------------+---------+-----------+----------+--------------+ POP      Full           Yes      Yes                                 +---------+---------------+---------+-----------+----------+--------------+ PTV      Full                                                        +---------+---------------+---------+-----------+----------+--------------+ PERO     Full                                                        +---------+---------------+---------+-----------+----------+--------------+  Summary: BILATERAL: - No evidence of deep vein thrombosis seen in the lower extremities, bilaterally. - No evidence of superficial venous thrombosis in the lower extremities, bilaterally. -No evidence of popliteal cyst, bilaterally.   *See table(s) above for measurements and observations. Electronically signed by Deitra Mayo MD on 06/12/2021 at 5:02:59 PM.    Final      Time coordinating discharge: Over 96 minutes    Dwyane Dee, MD  [facility] 06/16/2021, 1:29 PM

## 2021-06-16 NOTE — Progress Notes (Signed)
Physical Therapy Treatment Patient Details Name: [name] Small MRN: [id number] DOB: 11-06-1928 Today's Date: 06/16/2021    History of Present Illness [name] Small is a 87yo w/ a hx of nonobstructive CAD (cath 2012), PAD, CKD stage 3, HTN, DM2, and bladder CA who presented to the ED 06/11/21 w/ the acute onset of generalized weakness and hypoxia,CTA revealed no large PE, but did note R > LLL infiltrates suggestive of a possible aspiration. His resp difficutly worsened during his ER stay, requiring use of BiPAP to maintain sats at >90%.    PT Comments    Pt assisted with ambulating in hallway on 2L O2 [name] and SPO2 90% during ambulation.  Pt has oxygen at home and spouse reports pt also has pulse oximeter.   Pt looking forward to possible d/c home today.  Spouse educated on frequent position changes for pt to decrease chance of pressure injuries as well as making sure pt remains dry (checking periarea often), since spouse reports pt does not move very often at home.   Follow Up Recommendations  Home health PT;Supervision for mobility/OOB     Equipment Recommendations  None recommended by PT    Recommendations for Other Services       Precautions / Restrictions Precautions Precautions: Fall Precaution Comments: monitor sats, L trans met amputation.  patient reports  that he likes the socks, has ortho shoes at home Restrictions Weight Bearing Restrictions: No    Mobility  Bed Mobility Overal bed mobility: Modified Independent                  Transfers Overall transfer level: Needs assistance Equipment used: Rolling walker (2 wheeled) Transfers: Sit to/from Stand Sit to Stand: Min assist Stand pivot transfers: Min guard       General transfer comment: verbal cues for hand placement and technique; pt with difficulty with rise required assist; also light assist to steady once standing (SpO2 96% on 2.5L O2 [name] at rest); RN in room assisted with pericare and changing  mepilex dressing  Ambulation/Gait Ambulation/Gait assistance: Min guard Gait Distance (Feet): 40 Feet Assistive device: Rolling walker (2 wheeled) Gait Pattern/deviations: Step-through pattern;Decreased stride length Gait velocity: decr   General Gait Details: verbal cues for RW positioning; pt remained on 2L O2 [name] and SPO2 90% during ambulation; pt fatigued quickly   Stairs             Wheelchair Mobility    Modified Rankin (Stroke Patients Only)       Balance Overall balance assessment: Needs assistance (Simultaneous filing. User may not have seen previous data.) Sitting-balance support: Feet supported Sitting balance-Leahy Scale: Good     Standing balance support: Bilateral upper extremity supported;During functional activity (Simultaneous filing. User may not have seen previous data.) Standing balance-Leahy Scale: Poor (Simultaneous filing. User may not have seen previous data.) Standing balance comment: reliant on UE support, somewhat unsteady with initial standing (Simultaneous filing. User may not have seen previous data.)                            Cognition Arousal/Alertness: Awake/alert Behavior During Therapy: Flat affect Overall Cognitive Status: Within Functional Limits for tasks assessed                                 General Comments: following directions appropriately. patient continues to be [name] with hearing aids in  place.      Exercises      General Comments        Pertinent Vitals/Pain Pain Assessment: No/denies pain Faces Pain Scale: No hurt Pain Intervention(s): Repositioned;Monitored during session    Home Living                      Prior Function            PT Goals (current goals can now be found in the care plan section) Acute Rehab PT Goals Patient Stated Goal: go home Progress towards PT goals: Progressing toward goals    Frequency    Min 3X/week      PT Plan Current plan  remains appropriate    Co-evaluation              AM-PAC PT "6 Clicks" Mobility   Outcome Measure  Help needed turning from your back to your side while in a flat bed without using bedrails?: A Little Help needed moving from lying on your back to sitting on the side of a flat bed without using bedrails?: A Little Help needed moving to and from a bed to a chair (including a wheelchair)?: A Little Help needed standing up from a chair using your arms (Small.g., wheelchair or bedside chair)?: A Little Help needed to walk in hospital room?: A Little Help needed climbing 3-5 steps with a railing? : A Lot 6 Click Score: 17    End of Session Equipment Utilized During Treatment: Gait belt;Oxygen Activity Tolerance: Patient tolerated treatment well Patient left: in chair;with call bell/phone within reach;with chair alarm set;with family/visitor present Nurse Communication: Mobility status PT Visit Diagnosis: Unsteadiness on feet (R26.81);Difficulty in walking, not elsewhere classified (R26.2)     Time: 3818-2993 PT Time Calculation (min) (ACUTE ONLY): 14 min  Charges:  $Gait Training: 8-22 mins                    Arlyce Dice, DPT Acute Rehabilitation Services Pager: [phone] Office: [phone]    [name] Small,[name] Small 06/16/2021, 10:22 AM

## 2021-06-16 NOTE — Progress Notes (Signed)
Occupational Therapy Treatment Patient Details Name: [name] Small MRN: [id number] DOB: [birth year] Today's Date: 06/16/2021    History of present illness [name] Small is a 87yo w/ a hx of nonobstructive CAD (cath 2012), PAD, CKD stage 3, HTN, DM2, and bladder CA who presented to the ED 06/11/21 w/ the acute onset of generalized weakness and hypoxia,CTA revealed no large PE, but did note R > LLL infiltrates suggestive of a possible aspiration. His resp difficutly worsened during his ER stay, requiring use of BiPAP to maintain sats at >90%.   OT comments  Patient was noted to have improved ability to maintain O2 saturation on 2L./min during activity with patient maintaining above 90%. Patient's wife was present reporting patient does not complete LB dressing tasks at home. Patient's wife also reported using cath at home during the day. Patient completed functional mobility to and from bathroom in room navigating in smaller space with min guard with RW. Patient session was ended with breakfast tray arrival. D/c recommendations remain appropriate at this time.   Follow Up Recommendations  Home health OT;Supervision/Assistance - 24 hour    Equipment Recommendations  None recommended by OT    Recommendations for Other Services      Precautions / Restrictions Precautions Precautions: Fall Precaution Comments: monitor sats, L trans met amputation.  patient reports  that he likes the socks, has ortho shoes at home Restrictions Weight Bearing Restrictions: No       Mobility Bed Mobility Overal bed mobility: Modified Independent                  Transfers Overall transfer level: Needs assistance Equipment used: Rolling walker (2 wheeled) Transfers: Sit to/from Stand Sit to Stand: Min guard Stand pivot transfers: Min guard       General transfer comment: patient was noted to have imporved standing balance with rolling walker on this date.    Balance Overall balance  assessment: Needs assistance (Simultaneous filing. User may not have seen previous data.) Sitting-balance support: Feet supported Sitting balance-Leahy Scale: Good     Standing balance support: Bilateral upper extremity supported;During functional activity (Simultaneous filing. User may not have seen previous data.) Standing balance-Leahy Scale: Poor (Simultaneous filing. User may not have seen previous data.) Standing balance comment: reliant on UE support, somewhat unsteady with initial standing (Simultaneous filing. User may not have seen previous data.)                           ADL either performed or assessed with clinical judgement   ADL                                         General ADL Comments: patient reported having already washed up and completed oral hygiene at this time. patients wife reported desire for patient to transition home today. patient and wife were educated on how MD makes that decisions. patient completed functional moblity in room to bathroom and back with rolling walker and min guard. patient and wife were educated on importance of maintaingin mobiity in next level of care. patient and wife verbalized understanding. patients breakfast arrived and patients wife provided set up.     Vision Patient Visual Report: No change from baseline     Perception     Praxis      Cognition Arousal/Alertness: Awake/alert Behavior During Therapy:  Flat affect Overall Cognitive Status: Within Functional Limits for tasks assessed                                 General Comments: following directions appropriately. patient continues to be [name] Small with hearing aids in place.        Exercises     Shoulder Instructions       General Comments      Pertinent Vitals/ Pain       Pain Assessment: No/denies pain Faces Pain Scale: No hurt Pain Intervention(s): Repositioned;Monitored during session  Home Living                                           Prior Functioning/Environment              Frequency  Min 2X/week        Progress Toward Goals  OT Goals(current goals can now be found in the care plan section)  Progress towards OT goals: Progressing toward goals  Acute Rehab OT Goals Patient Stated Goal: go home OT Goal Formulation: With patient Time For Goal Achievement: 06/28/21 Potential to Achieve Goals: Good  Plan Discharge plan remains appropriate    Co-evaluation                 AM-PAC OT "6 Clicks" Daily Activity     Outcome Measure   Help from another person eating meals?: A Little Help from another person taking care of personal grooming?: A Little Help from another person toileting, which includes using toliet, bedpan, or urinal?: A Lot Help from another person bathing (including washing, rinsing, drying)?: A Lot Help from another person to put on and taking off regular upper body clothing?: A Little Help from another person to put on and taking off regular lower body clothing?: Total 6 Click Score: 14    End of Session Equipment Utilized During Treatment: Rolling walker  OT Visit Diagnosis: Unsteadiness on feet (R26.81);Other abnormalities of gait and mobility (R26.89);Muscle weakness (generalized) (M62.81)   Activity Tolerance Patient tolerated treatment well   Patient Left in chair;with call bell/phone within reach;with chair alarm set;with family/visitor present   Nurse Communication          Time: 4403-4742 OT Time Calculation (min): 16 min  Charges: OT General Charges $OT Visit: 1 Visit OT Treatments $Self Care/Home Management : 8-22 mins  Jackelyn Poling OTR/L, MS Acute Rehabilitation Department Office# [phone] Pager# [phone]    [place] 06/16/2021, 10:20 AM

## 2021-06-16 NOTE — Progress Notes (Signed)
Discharge education reviewed with patient and patient's family member.  Home medications returned from pharmacy.  Foley catheter removed, IV removed.  All questions answered.  Patient discharged in stable condition.

## 2021-06-18 ENCOUNTER — Telehealth: Payer: Self-pay | Admitting: *Deleted

## 2021-06-18 NOTE — Telephone Encounter (Signed)
Denise with [facility] left a voicemail stating that patient has been discharged from the hospital. Langley Gauss stated that they have been referred to the patient for palliative care. Langley Gauss requested a call back letting them know that Dr. Damita Dunnings is okay with this.  When calling back press opt 2

## 2021-06-18 NOTE — Telephone Encounter (Signed)
Called [facility] and gave the ok for palliative care.

## 2021-06-18 NOTE — Telephone Encounter (Signed)
I agree, assuming patient consents.  I saw the inpatient notes.  Thanks.

## 2021-06-19 ENCOUNTER — Other Ambulatory Visit: Payer: Self-pay | Admitting: Family Medicine

## 2021-06-19 ENCOUNTER — Telehealth: Payer: Self-pay

## 2021-06-19 ENCOUNTER — Other Ambulatory Visit: Payer: Medicare Other

## 2021-06-19 DIAGNOSIS — R300 Dysuria: Secondary | ICD-10-CM | POA: Diagnosis not present

## 2021-06-19 LAB — POC URINALSYSI DIPSTICK (AUTOMATED)
Bilirubin, UA: NEGATIVE
Glucose, UA: NEGATIVE
Ketones, UA: NEGATIVE
Nitrite, UA: NEGATIVE
Protein, UA: POSITIVE — AB
Spec Grav, UA: 1.015 (ref 1.010–1.025)
Urobilinogen, UA: 0.2 E.U./dL
pH, UA: 6 (ref 5.0–8.0)

## 2021-06-19 MED ORDER — CEPHALEXIN 500 MG PO CAPS: 500.0000 mg | ORAL_CAPSULE | Freq: Three times a day (TID) | ORAL | 0 refills | Status: DC

## 2021-06-19 NOTE — Addendum Note (Signed)
Addended by: Sherrilee Gilles B on: 06/19/2021 03:21 PM   Modules accepted: Orders

## 2021-06-19 NOTE — Telephone Encounter (Signed)
U/a and ucx.  I put in the order for Ucx.  Please put in the u/a.  Many thanks.

## 2021-06-19 NOTE — Telephone Encounter (Signed)
Urine has been dropped off. What do you want to order?

## 2021-06-19 NOTE — Telephone Encounter (Signed)
Please check with patient/wife, especially about urinary symptoms.  See if they can collect a urine sample at home.  We may need to treat him preemptively.  Thanks.

## 2021-06-19 NOTE — Telephone Encounter (Signed)
Spoke with patient's wife [name] Small regarding scheduling a Palliative consult. She requested a call back in a few days after discussing with the [place]. She states she is unsure which services they will be offering.

## 2021-06-19 NOTE — Addendum Note (Signed)
Addended by: Tonia Ghent on: 06/19/2021 01:58 PM   Modules accepted: Orders

## 2021-06-21 ENCOUNTER — Telehealth: Payer: Self-pay | Admitting: Family Medicine

## 2021-06-21 LAB — URINE CULTURE
MICRO NUMBER:: 12219579
SPECIMEN QUALITY:: ADEQUATE

## 2021-06-21 NOTE — Telephone Encounter (Signed)
Please give the order.  Thanks.   

## 2021-06-21 NOTE — Telephone Encounter (Signed)
[name] Small called in from [facility] and wanted to know about getting continuation home health.  Home Health verbal orders Caller Name: [name] Small Name:  [name] Small number:  [phone]  Requesting OT/PT/Skilled nursing/Social Work/Speech: pt  Reason: having pneumonia  Frequency: 2w6 1w2  Please forward to [facility] pool or providers CMA

## 2021-06-22 ENCOUNTER — Encounter: Payer: Self-pay | Admitting: Family Medicine

## 2021-06-22 NOTE — Telephone Encounter (Signed)
I called the nurse Shanon Brow and gave the verbal order per the provider for PT/OT and he understood.Marajade Lei,cma

## 2021-06-22 NOTE — Telephone Encounter (Signed)
This needs to be done in office visit right? So that everyone can sign and education can be done for it?

## 2021-06-24 NOTE — Telephone Encounter (Signed)
Given the situation, and since I can confirm in the chart his code status from recent hospitalization ("Code Status: [place] confirmed with patient and wife"), then I think it makes sense to proceed.  Please pull a DNR form for me and I'll sign it.  Thanks.

## 2021-06-27 ENCOUNTER — Telehealth: Payer: Self-pay | Admitting: *Deleted

## 2021-06-27 NOTE — Telephone Encounter (Signed)
Please give the order.  Please set up virtual if needed but recent inpatient stay should qualify.  Thanks.

## 2021-06-27 NOTE — Telephone Encounter (Signed)
Shanon Brow PT with [facility] left a voicemail stating that patient was in the hospital and is back home now. Shanon Brow stated that patient is having an issue with his oxygen. Shanon Brow stated that patient was on 2 liters of oxygen but now is needing 2.5 liters. Shanon Brow stated that patient needs an 02 concentrator in his home. Shanon Brow stated that patient needs a portable oxygen tank to carry with him when he goes out. Shanon Brow stated that patient probably needs a face to face visit but is not able to come to the office because he does not have  portable oxygen to take with him when he leaves the house.

## 2021-06-29 ENCOUNTER — Other Ambulatory Visit: Payer: Self-pay | Admitting: Family Medicine

## 2021-06-29 ENCOUNTER — Encounter: Payer: Self-pay | Admitting: Family Medicine

## 2021-06-29 MED ORDER — CEPHALEXIN 500 MG PO CAPS: 500.0000 mg | ORAL_CAPSULE | Freq: Three times a day (TID) | ORAL | 0 refills | Status: DC

## 2021-06-29 NOTE — Telephone Encounter (Signed)
Saw the [employer]. Thanks.

## 2021-06-29 NOTE — Telephone Encounter (Signed)
Dr. Damita Dunnings has responded via mychart to message.

## 2021-06-29 NOTE — Telephone Encounter (Signed)
Called spoke to patient this will need to be a virtual visit. Have set that up. Wife also states she sent my chart message about urine just wanted to make sure you have seen it.

## 2021-06-30 ENCOUNTER — Other Ambulatory Visit: Payer: Self-pay | Admitting: Family Medicine

## 2021-07-03 ENCOUNTER — Telehealth (INDEPENDENT_AMBULATORY_CARE_PROVIDER_SITE_OTHER): Payer: Medicare Other | Admitting: Family Medicine

## 2021-07-03 DIAGNOSIS — J438 Other emphysema: Secondary | ICD-10-CM

## 2021-07-03 DIAGNOSIS — N390 Urinary tract infection, site not specified: Secondary | ICD-10-CM | POA: Diagnosis not present

## 2021-07-03 DIAGNOSIS — Z794 Long term (current) use of insulin: Secondary | ICD-10-CM | POA: Diagnosis not present

## 2021-07-03 DIAGNOSIS — R339 Retention of urine, unspecified: Secondary | ICD-10-CM | POA: Diagnosis not present

## 2021-07-03 DIAGNOSIS — E1169 Type 2 diabetes mellitus with other specified complication: Secondary | ICD-10-CM

## 2021-07-03 NOTE — Progress Notes (Signed)
Virtual visit completed through WebEx or similar program Patient location: home  Provider location: [facility], office  Participants: Patient and me (unless stated otherwise below)  Pandemic considerations d/w pt.   Limitations and rationale for visit method d/w patient.  Patient agreed to proceed.   CC: follow up.   HPI: O2 use.    Had to use BiPap as inpatient but not now.  Still on O2 2.5L.  He hasn't needed inc to 3L recently.  He walked w/o O2 (ie on RA) yesterday and O2 dropped to 74%.  He recovered with restarting O2.    He needs a portable 02 concentrator in his home along with a portable oxygen tank to carry with him when he goes out.  Patient was not not able to come to the office because he does not have  portable oxygen to take with him when he leaves the house.   His current concentrator is too big to move and he needs a smaller unit.  Using [employer] for equipment.    We talked about other issues.  He had UTI recently.  That complicated his situation.  He has needed in and out cath at baseline.  Goal is to avoid indwelling catheter as that raises his UTI risk. Since having UTI, sugar has been higher.  Still on baseline insulin/Lantus.  Urine is now clear but still with odor.  He has 3 days of abx remaining.  They can update me about his urine and sugar on Thursday.  Still on flomax.    No fevers.    Meds and allergies reviewed.   ROS: Per HPI unless specifically indicated in ROS section   NAD Speech wnl  A/P:  COPD with hypoxia, requiring supplemental oxygen.  See above.  I will ask for help setting up a portable concentrator/small tank for patient to travel.  He desaturates when off oxygen and recovers when back on oxygen.  See above.  Recent UTI.  Goal to avoid indwelling catheter.  Continue antibiotics for now and patient/wife will update me on Thursday about his situation.  We may need to extend his Keflex prescription.  He is improving in the meantime.   Would still continue Flomax.  Diabetes.  He has had some higher sugars with concurrent UTI but I do not want to change his insulin dose right now.  I do not want to induce hypoglycemia and I expect his sugar to improve when his UTI clears.  Discussed with patient and wife and they agree.

## 2021-07-04 ENCOUNTER — Encounter: Payer: Self-pay | Admitting: Family Medicine

## 2021-07-04 NOTE — Assessment & Plan Note (Signed)
COPD with hypoxia, requiring supplemental oxygen.  See above.  I will ask for help setting up a portable concentrator/small tank for patient to travel.  He desaturates when off oxygen and recovers when back on oxygen.  See above.

## 2021-07-04 NOTE — Assessment & Plan Note (Signed)
   Recent UTI.  Goal to avoid indwelling catheter.  Continue antibiotics for now and patient/wife will update me on Thursday about his situation.  We may need to extend his Keflex prescription.  He is improving in the meantime.  Would still continue Flomax.

## 2021-07-04 NOTE — Assessment & Plan Note (Addendum)
  Diabetes with history of PAD and amputation.  He has had some higher sugars with concurrent UTI but I do not want to change his insulin dose right now.  I do not want to induce hypoglycemia and I expect his sugar to improve when his UTI clears.  Discussed with patient and wife and they agree.

## 2021-07-05 ENCOUNTER — Telehealth: Payer: Self-pay

## 2021-07-05 NOTE — Telephone Encounter (Signed)
Spoke with patient's wife [name] Small and scheduled an in-person Palliative Consult for 07/19/21 @ 12:30PM with Dr. Hollace Kinnier. Documentation will be noted in [facility].   COVID screening was negative. No pets in home. Patient lives with wife.  Consent obtained; updated Outlook/Netsmart/Team List and Epic.   Family is aware they may be receiving a call from provider the day before or day of to confirm appointment.

## 2021-07-09 ENCOUNTER — Telehealth: Payer: Medicare Other | Admitting: Family Medicine

## 2021-07-10 ENCOUNTER — Other Ambulatory Visit: Payer: Medicare Other

## 2021-07-10 ENCOUNTER — Other Ambulatory Visit: Payer: Self-pay | Admitting: Family Medicine

## 2021-07-10 ENCOUNTER — Encounter: Payer: Self-pay | Admitting: Family Medicine

## 2021-07-10 DIAGNOSIS — J9600 Acute respiratory failure, unspecified whether with hypoxia or hypercapnia: Secondary | ICD-10-CM | POA: Diagnosis not present

## 2021-07-10 DIAGNOSIS — R300 Dysuria: Secondary | ICD-10-CM

## 2021-07-10 DIAGNOSIS — J449 Chronic obstructive pulmonary disease, unspecified: Secondary | ICD-10-CM | POA: Diagnosis not present

## 2021-07-10 DIAGNOSIS — R062 Wheezing: Secondary | ICD-10-CM | POA: Diagnosis not present

## 2021-07-10 DIAGNOSIS — I509 Heart failure, unspecified: Secondary | ICD-10-CM | POA: Diagnosis not present

## 2021-07-10 MED ORDER — CEPHALEXIN 500 MG PO CAPS: 500.0000 mg | ORAL_CAPSULE | Freq: Three times a day (TID) | ORAL | 0 refills | Status: DC

## 2021-07-10 NOTE — Telephone Encounter (Signed)
See my chart message.  Please call patient/wife.  I would restart Keflex and I would like to get a urine culture collected prior to starting antibiotics.  Please see about sending several unopened urine specimen containers home for the patient, in case he needs to do this again in the future.  Thanks.

## 2021-07-11 ENCOUNTER — Other Ambulatory Visit: Payer: Self-pay

## 2021-07-11 DIAGNOSIS — R0902 Hypoxemia: Secondary | ICD-10-CM

## 2021-07-12 LAB — URINE CULTURE
MICRO NUMBER:: 12309888
SPECIMEN QUALITY:: ADEQUATE

## 2021-07-13 ENCOUNTER — Telehealth: Payer: Self-pay

## 2021-07-13 NOTE — Telephone Encounter (Signed)
Gala Murdoch, RN with [facility], states that pt needs a new order sent to [employer] for a 3L travel O2 tank, since his needs have increased since hospital stay. Please send in ASAP. [employer]'s fax number is [phone].

## 2021-07-13 NOTE — Telephone Encounter (Signed)
Please give the order.  Thanks.   

## 2021-07-13 NOTE — Telephone Encounter (Signed)
Order for portable O2 tank was done on 07/11/21 and sent to [facility]. I also received confirmation that they received the orders.

## 2021-07-17 ENCOUNTER — Ambulatory Visit: Payer: Medicare Other | Admitting: Cardiovascular Disease

## 2021-07-18 ENCOUNTER — Encounter: Payer: Self-pay | Admitting: Family Medicine

## 2021-07-18 ENCOUNTER — Telehealth: Payer: Self-pay | Admitting: Cardiovascular Disease

## 2021-07-18 NOTE — Telephone Encounter (Signed)
  Patient Consent for Virtual Visit        [name] Small has provided verbal consent on 07/18/2021 for a virtual visit (video or telephone).   CONSENT FOR VIRTUAL VISIT FOR:  [name] Small  By participating in this virtual visit I agree to the following:  I hereby voluntarily request, consent and authorize [facility] and its employed or contracted physicians, physician assistants, nurse practitioners or other licensed health care professionals (the Practitioner), to provide me with telemedicine health care services (the "Services") as deemed necessary by the treating Practitioner. I acknowledge and consent to receive the Services by the Practitioner via telemedicine. I understand that the telemedicine visit will involve communicating with the Practitioner through live audiovisual communication technology and the disclosure of certain medical information by electronic transmission. I acknowledge that I have been given the opportunity to request an in-person assessment or other available alternative prior to the telemedicine visit and am voluntarily participating in the telemedicine visit.  I understand that I have the right to withhold or withdraw my consent to the use of telemedicine in the course of my care at any time, without affecting my right to future care or treatment, and that the Practitioner or I may terminate the telemedicine visit at any time. I understand that I have the right to inspect all information obtained and/or recorded in the course of the telemedicine visit and may receive copies of available information for a reasonable fee.  I understand that some of the potential risks of receiving the Services via telemedicine include:  Delay or interruption in medical evaluation due to technological equipment failure or disruption; Information transmitted may not be sufficient (e.g. poor resolution of images) to allow for appropriate medical decision making by the Practitioner;  and/or  In rare instances, security protocols could fail, causing a breach of personal health information.  Furthermore, I acknowledge that it is my responsibility to provide information about my medical history, conditions and care that is complete and accurate to the best of my ability. I acknowledge that Practitioner's advice, recommendations, and/or decision may be based on factors not within their control, such as incomplete or inaccurate data provided by me or distortions of diagnostic images or specimens that may result from electronic transmissions. I understand that the practice of medicine is not an exact science and that Practitioner makes no warranties or guarantees regarding treatment outcomes. I acknowledge that a copy of this consent can be made available to me via my patient portal ([facility]), or I can request a printed copy by calling the office of [facility].    I understand that my insurance will be billed for this visit.   I have read or had this consent read to me. I understand the contents of this consent, which adequately explains the benefits and risks of the Services being provided via telemedicine.  I have been provided ample opportunity to ask questions regarding this consent and the Services and have had my questions answered to my satisfaction. I give my informed consent for the services to be provided through the use of telemedicine in my medical care

## 2021-07-19 DIAGNOSIS — J449 Chronic obstructive pulmonary disease, unspecified: Secondary | ICD-10-CM | POA: Diagnosis not present

## 2021-07-19 DIAGNOSIS — N1832 Chronic kidney disease, stage 3b: Secondary | ICD-10-CM | POA: Diagnosis not present

## 2021-07-19 DIAGNOSIS — Z89432 Acquired absence of left foot: Secondary | ICD-10-CM | POA: Diagnosis not present

## 2021-07-19 DIAGNOSIS — E1151 Type 2 diabetes mellitus with diabetic peripheral angiopathy without gangrene: Secondary | ICD-10-CM | POA: Diagnosis not present

## 2021-07-19 DIAGNOSIS — Z515 Encounter for palliative care: Secondary | ICD-10-CM | POA: Diagnosis not present

## 2021-07-19 DIAGNOSIS — J9610 Chronic respiratory failure, unspecified whether with hypoxia or hypercapnia: Secondary | ICD-10-CM | POA: Diagnosis not present

## 2021-07-20 ENCOUNTER — Encounter: Payer: Self-pay | Admitting: Medical

## 2021-07-20 ENCOUNTER — Ambulatory Visit (INDEPENDENT_AMBULATORY_CARE_PROVIDER_SITE_OTHER): Payer: Medicare Other | Admitting: Medical

## 2021-07-20 ENCOUNTER — Other Ambulatory Visit: Payer: Self-pay

## 2021-07-20 VITALS — BP 140/58 | HR 69 | Temp 95.9°F | Ht 74.0 in | Wt 202.0 lb

## 2021-07-20 DIAGNOSIS — J449 Chronic obstructive pulmonary disease, unspecified: Secondary | ICD-10-CM

## 2021-07-20 DIAGNOSIS — N184 Chronic kidney disease, stage 4 (severe): Secondary | ICD-10-CM

## 2021-07-20 DIAGNOSIS — I2510 Atherosclerotic heart disease of native coronary artery without angina pectoris: Secondary | ICD-10-CM

## 2021-07-20 DIAGNOSIS — I10 Essential (primary) hypertension: Secondary | ICD-10-CM | POA: Diagnosis not present

## 2021-07-20 DIAGNOSIS — R778 Other specified abnormalities of plasma proteins: Secondary | ICD-10-CM

## 2021-07-20 DIAGNOSIS — E782 Mixed hyperlipidemia: Secondary | ICD-10-CM | POA: Diagnosis not present

## 2021-07-20 DIAGNOSIS — I5022 Chronic systolic (congestive) heart failure: Secondary | ICD-10-CM

## 2021-07-20 MED ORDER — FUROSEMIDE 20 MG PO TABS: 20.0000 mg | ORAL_TABLET | Freq: Every day | ORAL | 2 refills | Status: DC | PRN

## 2021-07-20 MED ORDER — METOPROLOL TARTRATE 25 MG PO TABS: 12.5000 mg | ORAL_TABLET | Freq: Two times a day (BID) | ORAL | 5 refills | Status: DC

## 2021-07-20 NOTE — Patient Instructions (Signed)
Medication Instructions:  Your physician has recommended you make the following change in your medication:   START Metoprolol 12.5 mg 1/2 tablet twice a day. An Rx has been sent to your pharmacy.  START Furosemide (Lasix) 20 mg daily as needed for swelling. An Rx has been sent to your pharmacy.   *If you need a refill on your cardiac medications before your next appointment, please call your pharmacy*   Lab Work: None ordered If you have labs (blood work) drawn today and your tests are completely normal, you will receive your results only by: [facility] (if you have MyChart) OR A paper copy in the mail If you have any lab test that is abnormal or we need to change your treatment, we will call you to review the results.   Testing/Procedures: None ordered   Follow-Up: At [facility], you and your health needs are our priority.  As part of our continuing mission to provide you with exceptional heart care, we have created designated Provider Care Teams.  These Care Teams include your primary Cardiologist (physician) and Advanced Practice Providers (APPs -  Physician Assistants and Nurse Practitioners) who all work together to provide you with the care you need, when you need it.  We recommend signing up for the patient portal called "MyChart".  Sign up information is provided on this After Visit Summary.  MyChart is used to connect with patients for Virtual Visits (Telemedicine).  Patients are able to view lab/test results, encounter notes, upcoming appointments, etc.  Non-urgent messages can be sent to your provider as well.   To learn more about what you can do with MyChart, go to [email or web address].    Your next appointment:   2 month(s)  The format for your next appointment:   In Person  Provider:   You may see [name], MD or one of the following Advanced Practice Providers on your designated Care Team:   Murray Hodgkins, NP Christell Faith, PA-C Marrianne Mood, PA-C Cadence Kathlen Mody, [place]   Other Instructions N/A

## 2021-07-20 NOTE — Progress Notes (Addendum)
Virtual Visit via Telephone Note   This visit type was conducted due to national recommendations for restrictions regarding the COVID-19 Pandemic (e.g. social distancing) in an effort to limit this patient's exposure and mitigate transmission in our community.  Due to his co-morbid illnesses, this patient is at least at moderate risk for complications without adequate follow up.  This format is felt to be most appropriate for this patient at this time.  The patient did not have access to video technology/had technical difficulties with video requiring transitioning to audio format only (telephone).  All issues noted in this document were discussed and addressed.  No physical exam could be performed with this format.  Please refer to the patient's chart for his  consent to telehealth for [facility].    Date:  07/23/2021   ID:  [name] Small, DOB [birth year], MRN [id number] The patient was identified using 2 identifiers.  Patient Location: Home Provider location: office  PCP:  Tonia Ghent, MD   [facility] Providers Cardiologist:  [name], MD {   Evaluation Performed:  Follow-Up Visit  Chief Complaint:  hospital follow-up  History of Present Illness:    [name] Small is a 87 y.o. male with PAD, nonobstructive CAD, CKD stage 4, HTN, DM2, bladder cancer who is being seen for hospital follow-up.   Patient had a cardiac cath in 2012 with mild nonobstructive CAD, normal CO, mildly elevated filing pressures with no evidence of significant pulmonary HTN. Last nuc 2016 was mildly abnormal but no need for cath. Patient was lost to follow-up.   Admitted 06/11/21 for generalized weakness and respiratory failure from PNA.ddimer elevated but chest CT was negative for PE. HS trop was elevated to 300, but felt to be demand ischemia. BNP was elevated to 300. Echo showed LVEF 55-60% with G2DD.   Spoke to wife, the caregiver, over the phone. Have PT coming in twice a week. Also a  CNA comes 3 time a week to help bathe the patient. He is up and about a little but. Not as strong as before, but he is getting there. He is on O2 all the time, he is on 2.5L. No chest pain, LLE, orthopnea, pnd. After hospitalization he had UTI and was treated by PCP. Also referred to palliative care, had visit form them yesterday. They will come back in October.   The patient does not have symptoms concerning for COVID-19 infection (fever, chills, cough, or new shortness of breath).    Past Medical History:  Diagnosis Date   (HFpEF) heart failure with preserved ejection fraction (HCC)    Arthritis    Back pain    s/p lumbar injection 2014   BENIGN PROSTATIC HYPERTROPHY 05/21/2007   Bladder cancer ([place]) 10/2011   CAD (coronary artery disease)    nonobstructive by cath 6/12:  mid LAD 30%, proximal obtuse marginal-2 30%, proximal RCA 20%, mid RCA 30-40%.  He had normal cardiac output and mildly elevated filling pressures but no significant pulmonary hypertension;   Echocardiogram in May 2012 demonstrated EF 50-55% and left atrial enlargement    Cellulitis of left foot    Hospitalized in 2006   Chronic kidney disease (CKD), stage III (moderate) (HCC) 12/04/2007   FOLLOWED BY DR PATEL   Chronic pain of lower extremity    Complication of anesthesia    1 time bladder cancer surgery- 2012 , oxygen satruratioon dropped had to stay overnight, no problems since then.   COPD (chronic obstructive pulmonary disease) (  [place])    DIABETES MELLITUS, TYPE II 05/21/2007   DIVERTICULOSIS, COLON 05/21/2007   pt denies   Dyspnea    with exertion, patient mointors saturation   Fatigue AGE-RELATED   Gross hematuria    pt denies   HYPERTENSION 05/21/2007   Impaired hearing BILATERAL HEARING AIDS   On home oxygen therapy    as needed   PAD (peripheral artery disease) ([place])    a. 02/2016 L foot nonhealing ulcer-->Periph angio: L pop 100 w/ reconstitution via extensive vollats to the prox-mid peroneal (only  patent vessel BK)-->PTA of L Pop and peroneal; b. Ongoing LE ischemia 5 & 06/2016 req amputation of toes on L foot.   Pneumonia    Hx  of    Psoriasis ELBOWS   PSORIASIS, SCALP 10/25/2008   Past Surgical History:  Procedure Laterality Date   AMPUTATION Left 03/27/2016   Procedure: partial first AMPUTATION RAY; LEFT;  Surgeon: Trula Slade, DPM;  Location: [place];  Service: Podiatry;  Laterality: Left;   AMPUTATION Left 06/12/2016   Procedure: TRANSMETATARSAL AMPUTATION LEFT FOOT;  Surgeon: Newt Minion, MD;  Location: [place];  Service: Orthopedics;  Laterality: Left;   APPENDECTOMY  1941   BIOPSY  05/16/2020   Procedure: BIOPSY;  Surgeon: Lavena Bullion, DO;  Location: [facility];  Service: Gastroenterology;;   CARDIAC CATHETERIZATION  04-24-11/  DR Rogue Jury ARIDA   MILD NONOBSTRUCTIVE CAD, NORMA CARDIAC OUTPUT   CARDIOVASCULAR STRESS TEST  2007   CATARACT EXTRACTION W/ INTRAOCULAR LENS  IMPLANT, BILATERAL Bilateral ~ 2010   CYSTOSCOPY  12/25/2011   Procedure: CYSTOSCOPY;  Surgeon: Molli Hazard, MD;  Location: [facility];  Service: Urology;  Laterality: N/A;  needs intubation and to be paralyzed    CYSTOSCOPY W/ RETROGRADES  10/30/2011   Procedure: CYSTOSCOPY WITH RETROGRADE PYELOGRAM;  Surgeon: Molli Hazard, MD;  Location: [facility];  Service: Urology;  Laterality: Bilateral;  CYSTOSCOPY POSS TURBT BILATERAL RETROGRADE PYLEOGRAM    CYSTOSCOPY W/ RETROGRADES  11/30/2012   Procedure: CYSTOSCOPY WITH RETROGRADE PYELOGRAM;  Surgeon: Molli Hazard, MD;  Location: [facility];  Service: Urology;  Laterality: Bilateral;  Flexible cystoscopy.   CYSTOSCOPY WITH BIOPSY  11/30/2012   Procedure: CYSTOSCOPY WITH BIOPSY;  Surgeon: Molli Hazard, MD;  Location: [facility];  Service: Urology;  Laterality: N/A;   ESOPHAGOGASTRODUODENOSCOPY (EGD) WITH PROPOFOL N/A 05/16/2020   Procedure:  ESOPHAGOGASTRODUODENOSCOPY (EGD) WITH PROPOFOL;  Surgeon: Lavena Bullion, DO;  [facility];  Service: Gastroenterology;  Laterality: N/A;   FEMUR IM NAIL Left 08/01/2020   Procedure: INTRAMEDULLARY (IM) RETROGRADE FEMORAL NAILING;  Surgeon: Altamese South Point, MD;  Location: [place];  Service: Orthopedics;  Laterality: Left;   INCISION AND DRAINAGE FOOT Left ~ 2005   LEFT FOOT DUE TO INFECTION FROM  NAIL PUNCTURE INJURY   LUMBAR LAMINECTOMY/DECOMPRESSION MICRODISCECTOMY N/A 10/18/2016   Procedure: LEFT AND CENTRAL L4-5 LUMBAR LAMINECTOMY WITH RESECTION OF SYNOVIAL CYST;  Surgeon: Jessy Oto, MD;  Location: [place];  Service: Orthopedics;  Laterality: N/A;   ORIF FEMUR FRACTURE Left 04/19/2020   Procedure: OPEN REDUCTION INTERNAL FIXATION FEMORAL SHAFT FRACTURE;  Surgeon: Shona Needles, MD;  Location: [place];  Service: Orthopedics;  Laterality: Left;   PENILE PROSTHESIS IMPLANT  1990s   PERIPHERAL VASCULAR CATHETERIZATION N/A 02/14/2016   Procedure: Abdominal Aortogram w/Lower Extremity;  Surgeon: Wellington Hampshire, MD;  Location: [place] [facility];  Service: Cardiovascular;  Laterality: N/A;  PERIPHERAL VASCULAR CATHETERIZATION Left 02/28/2016   Procedure: Peripheral Vascular Balloon Angioplasty;  Surgeon: Wellington Hampshire, MD;  Location: [place] [facility];  Service: Cardiovascular;  Laterality: Left;  left peroneal and popliteal artery   TONSILLECTOMY AND ADENOIDECTOMY  1962   TOTAL HIP ARTHROPLASTY Left 12/27/2016   Procedure: LEFT TOTAL HIP ARTHROPLASTY ANTERIOR APPROACH;  Surgeon: Mcarthur Rossetti, MD;  Location: [facility];  Service: Orthopedics;  Laterality: Left;   TRANSURETHRAL RESECTION OF BLADDER TUMOR  10/30/2011   Procedure: TRANSURETHRAL RESECTION OF BLADDER TUMOR (TURBT);  Surgeon: Molli Hazard, MD;  Location: [facility];  Service: Urology;  Laterality: N/A;   TRANSURETHRAL RESECTION OF BLADDER TUMOR  12/25/2011   Procedure: TRANSURETHRAL RESECTION OF  BLADDER TUMOR (TURBT);  Surgeon: Molli Hazard, MD;  Location: [facility];  Service: Urology;  Laterality: N/A;  need long gyrus instruments    VASECTOMY  1990s     Current Meds  Medication Sig   acetaminophen (TYLENOL) 325 MG tablet Take 1-2 tablets (325-650 mg total) by mouth every 4 (four) hours as needed for mild pain.   Apremilast 30 MG TABS Take 1 tablet by mouth daily.   atorvastatin (LIPITOR) 10 MG tablet Take 1 tablet (10 mg total) by mouth daily.   Cholecalciferol (VITAMIN D) 125 MCG (5000 UT) CAPS Take 1 capsule by mouth daily.   clobetasol (TEMOVATE) 0.05 % external solution Apply 1 application topically daily as needed (rash).    CRANBERRY PO Take 4 capsules by mouth daily. Takes AZO 2 tabs BID   doxycycline (ADOXA) 50 MG tablet Take 50 mg by mouth daily.   finasteride (PROSCAR) 5 MG tablet Take 5 mg by mouth daily.   fluticasone-salmeterol (ADVAIR) 250-50 MCG/ACT AEPB Take 1 puff by mouth 2 (two) times daily as needed (shortness of breath).   furosemide (LASIX) 20 MG tablet Take 1 tablet (20 mg total) by mouth daily as needed for edema.   insulin glargine (LANTUS) 100 UNIT/ML Solostar Pen Inject 15 Units into the skin daily.   metoprolol tartrate (LOPRESSOR) 25 MG tablet Take 0.5 tablets (12.5 mg total) by mouth 2 (two) times daily.   Multiple Vitamin (MULTIVITAMIN WITH MINERALS) TABS tablet Take 1 tablet by mouth daily.   omeprazole (PRILOSEC) 40 MG capsule TAKE 1 CAPSULE (40 MG TOTAL) BY MOUTH DAILY.   OXYGEN Inhale 2.5 L into the lungs.   tamsulosin (FLOMAX) 0.4 MG CAPS capsule TAKE 2 CAPSULES BY MOUTH DAILY AFTER SUPPER.   tiotropium (SPIRIVA) 18 MCG inhalation capsule Place 18 mcg into inhaler and inhale daily.   traMADol (ULTRAM) 50 MG tablet Take 1 tablet (50 mg total) by mouth every 8 (eight) hours as needed for moderate pain or severe pain.   traZODone (DESYREL) 50 MG tablet Take 0.5 tablets (25 mg total) by mouth at bedtime as needed for sleep.      Allergies:   Actos [pioglitazone hydrochloride], Aspirin, Ace inhibitors, Bactrim, Gabapentin, Lyrica [pregabalin], Pravastatin, and Sulfa drugs cross reactors   Social History   Tobacco Use   Smoking status: Former    Packs/day: 2.00    Years: 22.00    Pack years: 44.00    Types: Cigarettes    Quit date: 11/11/1968    Years since quitting: 52.7   Smokeless tobacco: Never  Vaping Use   Vaping Use: Never used  Substance Use Topics   Alcohol use: Yes    Comment: occassional- 10/17/16- none  in 4 months- "too sick"  Drug use: No     Family Hx: The patient's family history includes Diabetes in his mother; Heart disease in his brother, brother, brother, brother, brother, and brother; Lung cancer in his brother.  ROS:   Please see the history of present illness.     All other systems reviewed and are negative.   Prior CV studies:   The following studies were reviewed today:  Echo 06/2021  1. Left ventricular ejection fraction, by estimation, is 55 to 60%. The  left ventricle has normal function. The left ventricle has no regional  wall motion abnormalities. There is mild left ventricular hypertrophy.  Left ventricular diastolic parameters  are consistent with Grade II diastolic dysfunction (pseudonormalization).  Elevated left atrial pressure.   2. Right ventricular systolic function is normal. The right ventricular  size is mildly enlarged. There is moderately elevated pulmonary artery  systolic pressure.   3. The mitral valve is normal in structure. No evidence of mitral valve  regurgitation. No evidence of mitral stenosis.   4. The aortic valve is tricuspid. Aortic valve regurgitation is not  visualized. Mild to moderate aortic valve sclerosis/calcification is  present, without any evidence of aortic stenosis.   MPI 02/2015 He has mildly abnormal stress test, I wouldn't recommend cardiac cath but we need to intensify medical therapy for CAD. I would start him on  aspirin 81 mg po daily and pravastatin 10 mg po daily. We should schedule a CMP in 4 weeks and follow up with me in 2 months.   Labs/Other Tests and Data Reviewed:    EKG:  No ECG reviewed.  Recent Labs: 06/11/2021: B Natriuretic Peptide 304.5 06/12/2021: ALT 8 06/15/2021: BUN 36; Creatinine, Ser 1.62; Hemoglobin 14.5; Magnesium 2.0; Platelets 160; Potassium 3.9; Sodium 137   Recent Lipid Panel Lab Results  Component Value Date/Time   CHOL 127 03/23/2021 01:50 PM   TRIG 83.0 03/23/2021 01:50 PM   HDL 45.00 03/23/2021 01:50 PM   CHOLHDL 3 03/23/2021 01:50 PM   LDLCALC 65 03/23/2021 01:50 PM   LDLDIRECT 147.6 04/27/2009 10:12 AM    Wt Readings from Last 3 Encounters:  07/20/21 202 lb (91.6 kg)  07/03/21 170 lb (77.1 kg)  06/11/21 210 lb (95.3 kg)     Risk Assessment/Calculations:          Objective:    Vital Signs:  BP (!) 140/58 (BP Location: Left Arm, Patient Position: Sitting, Cuff Size: Normal)   Pulse 69   Temp (!) 95.9 F (35.5 C)   Ht 6\' 2"  (1.88 m)   Wt 202 lb (91.6 kg)   SpO2 97%   BMI 25.94 kg/m    VITAL SIGNS:  reviewed  ASSESSMENT & PLAN:    HFpEF Echo showed LVEF 55-60% with G2DD, mildly enlarged RV with normal systolic function and moderately elevated PASP. Wife reports patient is euvolemic on exam. He was on lasix in the past. Denies LLE, orthopnea, or pnd. He is on supplemental O2 at all times. Agreed to lasix 20mg  to use as needed. Encouraged daily weights, low salt dit, compression socks, leg elevation. BP mildly elevated. Start Lopressor 12.5mg  BID.   Elevated troponin Nonobstructive CAD Nonobstructive CAD noted on cath in 2012. Chest CTA during recent admission showed 3V disease, athough he had no angina. During hospitalization HS troponin elevated in the setting acute CHF, COPD exacerbation, PNA. He was treated with IV heparin. After discussion with wife and patient, no plan for ischemic testing at this time, especially since he is  asymptomatic. If  he becomes symptomatic can consider MPI. Cannot take Aspirin. Continue statin. Sill start low dose BB.   COPD on 2-3L Supplemental O2 is new since the hospitalization. He is following closely with PCP.Working with PT and has home nurses coming to the house.   HTN BP mildly elevated, 140/58. Start low dose BB  HLD LDL 65 03/2021. Continue Lipitor 10mg  daily  DM2 A1C 8.2. following with PCP  CKD stage IV Baseline around 1.5. Avoid nephrotoxic agents. Followed by PCP   COVID-19 Education: The signs and symptoms of COVID-19 were discussed with the patient and how to seek care for testing (follow up with PCP or arrange E-visit).  The importance of social distancing was discussed today.  Time:   Today, I have spent 30 minutes with the patient with telehealth technology discussing the above problems.     Medication Adjustments/Labs and Tests Ordered: Current medicines are reviewed at length with the patient today.  Concerns regarding medicines are outlined above.   Tests Ordered: No orders of the defined types were placed in this encounter.   Medication Changes: Meds ordered this encounter  Medications   metoprolol tartrate (LOPRESSOR) 25 MG tablet    Sig: Take 0.5 tablets (12.5 mg total) by mouth 2 (two) times daily.    Dispense:  15 tablet    Refill:  5   furosemide (LASIX) 20 MG tablet    Sig: Take 1 tablet (20 mg total) by mouth daily as needed for edema.    Dispense:  30 tablet    Refill:  2    Follow Up:   in 1-2 months  in 2 month(s)  Signed, Kellye Mizner Ninfa Meeker, PA-C  07/23/2021 10:27 AM    [facility]

## 2021-07-25 ENCOUNTER — Telehealth: Payer: Self-pay

## 2021-07-25 ENCOUNTER — Telehealth: Payer: Self-pay | Admitting: Medical

## 2021-07-25 NOTE — Telephone Encounter (Signed)
Please call Medical assistant with PCP regading patient's Metoprolol. States patient has quit taking this due to severe leg pain. Pt c/o medication issue:  1. Name of Medication: Metoprolol  2. How are you currently taking this medication (dosage and times per day)? 12.5 mg 2 times a day  3. Are you having a reaction (difficulty breathing--STAT)? No reaction  4. What is your medication issue? Severe leg pain

## 2021-07-25 NOTE — Chronic Care Management (AMB) (Addendum)
Chronic Care Management Pharmacy Assistant   Name: [name] Small  MRN: [id number] DOB: Dec 29, 1927  Reason for Encounter: Diabetes  Recent office visits 07/03/21 - PCP, telemedicine - Follow up visit for oxygen use, needs portable O2 for travel. No medication changes.  Recent consult visits:  07/20/21 - Cardiology, Telemedicine - Patient presented for follow up hospital visit. Start Lopressor 12.5mg  BID. Follow up 2 months. 05/10/21 - Dept of [place], [facility] - Dermatology consult   Hospital visits:  06/11/21 thru 06/16/21 [facility] patient presented for acute onset of generalized weakness and hypoxia.  Medications: Outpatient Encounter Medications as of 07/25/2021  Medication Sig   acetaminophen (TYLENOL) 325 MG tablet Take 1-2 tablets (325-650 mg total) by mouth every 4 (four) hours as needed for mild pain.   Apremilast 30 MG TABS Take 1 tablet by mouth daily.   atorvastatin (LIPITOR) 10 MG tablet Take 1 tablet (10 mg total) by mouth daily.   cephALEXin (KEFLEX) 500 MG capsule Take 1 capsule (500 mg total) by mouth 3 (three) times daily. (Patient not taking: Reported on 07/20/2021)   Cholecalciferol (VITAMIN D) 125 MCG (5000 UT) CAPS Take 1 capsule by mouth daily.   clobetasol (TEMOVATE) 0.05 % external solution Apply 1 application topically daily as needed (rash).    CRANBERRY PO Take 4 capsules by mouth daily. Takes AZO 2 tabs BID   docusate sodium (COLACE) 100 MG capsule Take 100 mg by mouth daily as needed for mild constipation. (Patient not taking: Reported on 07/20/2021)   doxycycline (ADOXA) 50 MG tablet Take 50 mg by mouth daily.   finasteride (PROSCAR) 5 MG tablet Take 5 mg by mouth daily.   fluticasone-salmeterol (ADVAIR) 250-50 MCG/ACT AEPB Take 1 puff by mouth 2 (two) times daily as needed (shortness of breath).   furosemide (LASIX) 20 MG tablet Take 1 tablet (20 mg total) by mouth daily as needed for edema.   insulin glargine (LANTUS) 100 UNIT/ML Solostar Pen  Inject 15 Units into the skin daily.   metoprolol tartrate (LOPRESSOR) 25 MG tablet Take 0.5 tablets (12.5 mg total) by mouth 2 (two) times daily.   Multiple Vitamin (MULTIVITAMIN WITH MINERALS) TABS tablet Take 1 tablet by mouth daily.   omeprazole (PRILOSEC) 40 MG capsule TAKE 1 CAPSULE (40 MG TOTAL) BY MOUTH DAILY.   OXYGEN Inhale 2.5 L into the lungs.   tamsulosin (FLOMAX) 0.4 MG CAPS capsule TAKE 2 CAPSULES BY MOUTH DAILY AFTER SUPPER.   tiotropium (SPIRIVA) 18 MCG inhalation capsule Place 18 mcg into inhaler and inhale daily.   traMADol (ULTRAM) 50 MG tablet Take 1 tablet (50 mg total) by mouth every 8 (eight) hours as needed for moderate pain or severe pain.   traZODone (DESYREL) 50 MG tablet Take 0.5 tablets (25 mg total) by mouth at bedtime as needed for sleep.   [DISCONTINUED] calcium carbonate (OS-CAL - DOSED IN MG OF ELEMENTAL CALCIUM) 1250 (500 Ca) MG tablet Take 1 tablet (500 mg of elemental calcium total) by mouth daily with breakfast. (Patient not taking: No sig reported)   [DISCONTINUED] escitalopram (LEXAPRO) 5 MG tablet Take 1 tablet (5 mg total) by mouth daily. (Patient not taking: No sig reported)   No facility-administered encounter medications on file as of 07/25/2021.     Recent Relevant Labs: Lab Results  Component Value Date/Time   HGBA1C 8.2 (H) 06/12/2021 05:15 AM   HGBA1C 8.3 (H) 03/23/2021 01:50 PM   HGBA1C 8.3 01/16/2021 12:00 AM   MICROALBUR 1.1 10/11/2015 09:44  AM   MICROALBUR 1.5 08/01/2009 09:21 AM    Kidney Function Lab Results  Component Value Date/Time   CREATININE 1.62 (H) 06/15/2021 04:52 AM   CREATININE 1.54 (H) 06/14/2021 08:52 AM   CREATININE 1.48 (H) 03/04/2016 09:35 AM   GFR 32.76 (L) 03/23/2021 01:50 PM   GFRNONAA 40 (L) 06/15/2021 04:52 AM   GFRAA 48 (L) 08/07/2020 03:09 PM     Contacted patient on 07/25/21 to discuss diabetes disease state. Spoke with patient's wife, [name] Small.  Current antihyperglycemic regimen:   Insulin glargine  (LANTUS) vial - 15 units once daily(bedtime)    Patient verbally confirms he is taking the above medications as directed. Yes  What diet changes have been made to improve diabetes control? None identified  What recent interventions/DTPs have been made to improve glycemic control: Recommend reducing Lantus to 12 units when fasting BG is less than 100. The patient has not changed insulin dose, only uses orange juice if hypoglycemia.   Have there been any recent hospitalizations or ED visits since last visit with CPP? Yes - 06/11/21 thru 06/16/21 [facility] patient presented for acute onset of generalized weakness and hypoxia.  Patient reports hypoglycemic symptoms: He had one episode during the last 2 weeks. The patient reported he did not feel well, he had some orange juice and the episode passed quickly.  Patient denies hyperglycemic symptoms, including blurry vision, excessive thirst, fatigue, polyuria, and weakness  How often are you checking your blood sugar? once daily  What are your blood sugars ranging?  Fasting: 07/25/21-76  its been running 80s fasting  During the week, how often does your blood glucose drop below 70?  One time in the past 2 weeks it was 64 and the patient drank orange juice and felt better.  The 15-15 rule for low sugars: If sugar reading below 70, have 15 grams of carbohydrate to raise your blood sugar and check it after 15 minutes. If it's still below 70 mg/dL, have another serving. 15 grams of carbs may be: -Glucose tablets (see instructions) -Gel tube (see instructions) -4 ounces (1/2 cup) of juice or regular soda (not diet) -1 tablespoon of sugar, honey, or corn syrup -Hard candies, jellybeans or gumdrops--see food label for how many to consume  Repeat these steps until your blood sugar is at least 70 mg/dL. Once your blood sugar is back to normal, eat a meal or snack to make sure it doesn't lower again.    Are you checking your feet daily/regularly?  Yes  Adherence Review: Is the patient currently on a STATIN medication? Yes Is the patient currently on ACE/ARB medication? No Does the patient have >5 day gap between last estimated fill dates? No  Care Gaps: Annual wellness visit in last year? No - 2017 Most recent A1C reading: 8.2%  (06/12/21) Most Recent BP reading: 140/58  69-P (07/20/21)  Last eye exam / retinopathy screening: 06/16/21 Last diabetic foot exam:  06/16/21  Star Rating Drugs:  Medication:  Last Fill: Day Supply Lantus   05/12/20  75 Atorvastatin 10mg  04/27/21 90  Cardiology appointment on 09/28/21  Debbora Dus, CPP notified  Avel Sensor, [place] Assistant [phone]  I have reviewed the care management and care coordination activities outlined in this encounter and I am certifying that I agree with the content of this note. Please ask patient to cut his insulin back to 12 units daily due to low fasting glucose. His fasting goal is 100-130.  Debbora Dus, PharmD Clinical  Pharmacist [facility] [phone]

## 2021-07-26 NOTE — Telephone Encounter (Signed)
I called and spoke with the patient's wife (ok per DPR), to follow up regarding his symptoms. Per Mrs. Abair, the patient developed severe leg pain the first night after taking metoprolol. He was seen in clinic on Friday 07/20/21 and was started on metoprolol tartrate 12.5 mg BID. He took his last dose of this on Sunday and his leg pain has stopped completely since that time.   He was also given a RX for lasix 20 mg to take 1 tablet daily as needed. Per Mrs. Dutson, the patient has not used the lasix at all.  I have advised her that we will forward to Cadence Morley, PA to review and call back with any further recommendations. She voices understanding and is agreeable.

## 2021-07-27 NOTE — Telephone Encounter (Signed)
Furth, Cadence H, PA-C  McGhee, Heather C, RN; [facility] [place] Triage 34 minutes ago (4:00 PM)   Ok, interesting he had leg pain with metoprolol since this is not very common side effect. Either way, it is ok to discontinue.  Noted regarding the lasix.     Spoke with pt's wife, [name].  Made aware of Cadence's recc that pt may discontinue Metoprolol.  Pt continues without leg pain since stopping Metoprolol on Sunday.  Advised that pt or wife call office with any recurrent s/s.  [name] voiced understanding. No further questions or concerns at this time.  Med list updated.

## 2021-08-02 DIAGNOSIS — R339 Retention of urine, unspecified: Secondary | ICD-10-CM | POA: Diagnosis not present

## 2021-08-08 NOTE — Progress Notes (Addendum)
Spoke with the wife [name] Small to see if [name] Small  continues to have low fasting BGs in the 80s. The past week his fasting BGs have been higher: 08/08/21-131. They report other readings this week fasting  121,118.  Debbora Dus, CPP notified  Avel Sensor, [place] Assistant [phone]  Total time spent for month CPA: 10 min.

## 2021-08-09 DIAGNOSIS — Z794 Long term (current) use of insulin: Secondary | ICD-10-CM | POA: Diagnosis not present

## 2021-08-09 DIAGNOSIS — E119 Type 2 diabetes mellitus without complications: Secondary | ICD-10-CM | POA: Diagnosis not present

## 2021-08-14 ENCOUNTER — Telehealth: Payer: Self-pay | Admitting: Family Medicine

## 2021-08-14 NOTE — Telephone Encounter (Signed)
Home Health verbal orders [place] Name: [facility] number: [phone]  Requesting OT/PT/Skilled nursing/Social Work/Speech:  Reason:Physical Therapy extension  Frequency:2 x wk for 1 wk  Please forward to [facility] pool or providers CMA

## 2021-08-14 NOTE — Telephone Encounter (Signed)
Verbal orders given to [facility] with [facility].

## 2021-08-14 NOTE — Telephone Encounter (Signed)
Please give the order.  Thanks.   

## 2021-08-15 ENCOUNTER — Other Ambulatory Visit: Payer: Self-pay | Admitting: Family Medicine

## 2021-08-15 DIAGNOSIS — E1151 Type 2 diabetes mellitus with diabetic peripheral angiopathy without gangrene: Secondary | ICD-10-CM | POA: Diagnosis not present

## 2021-08-15 DIAGNOSIS — N400 Enlarged prostate without lower urinary tract symptoms: Secondary | ICD-10-CM

## 2021-08-15 DIAGNOSIS — N1832 Chronic kidney disease, stage 3b: Secondary | ICD-10-CM | POA: Diagnosis not present

## 2021-08-15 DIAGNOSIS — J9610 Chronic respiratory failure, unspecified whether with hypoxia or hypercapnia: Secondary | ICD-10-CM | POA: Diagnosis not present

## 2021-08-15 DIAGNOSIS — J449 Chronic obstructive pulmonary disease, unspecified: Secondary | ICD-10-CM | POA: Diagnosis not present

## 2021-08-15 DIAGNOSIS — Z89432 Acquired absence of left foot: Secondary | ICD-10-CM | POA: Diagnosis not present

## 2021-08-15 DIAGNOSIS — Z515 Encounter for palliative care: Secondary | ICD-10-CM | POA: Diagnosis not present

## 2021-08-18 ENCOUNTER — Encounter: Payer: Self-pay | Admitting: Family Medicine

## 2021-08-20 ENCOUNTER — Other Ambulatory Visit: Payer: Self-pay | Admitting: Family Medicine

## 2021-08-22 ENCOUNTER — Other Ambulatory Visit: Payer: Self-pay | Admitting: Family Medicine

## 2021-08-22 DIAGNOSIS — J449 Chronic obstructive pulmonary disease, unspecified: Secondary | ICD-10-CM | POA: Diagnosis not present

## 2021-08-22 DIAGNOSIS — I509 Heart failure, unspecified: Secondary | ICD-10-CM | POA: Diagnosis not present

## 2021-08-22 DIAGNOSIS — R062 Wheezing: Secondary | ICD-10-CM | POA: Diagnosis not present

## 2021-08-22 DIAGNOSIS — J9600 Acute respiratory failure, unspecified whether with hypoxia or hypercapnia: Secondary | ICD-10-CM | POA: Diagnosis not present

## 2021-09-10 DIAGNOSIS — R339 Retention of urine, unspecified: Secondary | ICD-10-CM | POA: Diagnosis not present

## 2021-09-12 ENCOUNTER — Other Ambulatory Visit: Payer: Self-pay

## 2021-09-12 ENCOUNTER — Other Ambulatory Visit: Payer: Medicare Other

## 2021-09-12 VITALS — BP 138/60 | HR 76 | Temp 98.1°F

## 2021-09-12 DIAGNOSIS — Z515 Encounter for palliative care: Secondary | ICD-10-CM

## 2021-09-12 NOTE — Progress Notes (Signed)
PATIENT NAME: [name] Small DOB: [birth year] MRN: [id number]  PRIMARY CARE PROVIDER: Tonia Ghent, MD  RESPONSIBLE PARTY:  Acct ID - Guarantor Home Phone Work Phone Relationship Acct Type  [id number] [name], [name] [phone]  Self P/F     [place], [place], [place] [address]    PLAN OF CARE and INTERVENTIONS:               1.  GOALS OF CARE/ ADVANCE CARE PLANNING:  Remain home to under the care of his wife.  Desires to avoid hospitalizations.                2.  PATIENT/CAREGIVER EDUCATION:  Hospice Support               4. PERSONAL EMERGENCY PLAN:  Activate 911 for emergencies.                5.  DISEASE STATUS:  Joint visit completed with patient, wife and daughter.  Patient has declined over the last couple of days.  Increase weakness with worsening tremors.  No falls reported.  Patient is using his rolling walker but would be safer with a wheelchair.  Wife noted patient is somewhat resistant to use the wheelchair mainly because they are not able to get into the bathroom.  Increase sleeping noted over the last couple days.   Sleeping longer at night and napping more during the day.  Increase pain to bilateral lower extremities.  Patient is taking ES Tylenol  as needed but does not feel this is always effective.  He has tramadol in the home from earlier this year but wife has not administered any.  They are asking for approval to utilize this medication when tylenol is not effective.   Currently on O2 at 2.5 L via [place].  Patient is removing oxygen when ambulating to the living room.  Wife noted oxygen saturation drops into the 70's and 80's.  Discussion completed on importance keeping O2 in place with ambulation.   Currently has [facility] support 3x weekly with aide assistance.   Wife noted decrease fluid intake due to sleeping.  He is drinking Pedialyte this am.  Wife is cathing patient daily.  Yesterday, total of 1375 cc of urine drained.  Wife cathed patient 5 x yesterday. Wife  reports urine is darker and more concentrated.  Wife's biggest concern is length of time between caths, color and decrease liquid intake.  Food intake also decreased slightly but patient did eat Egg McMuffin and [place] wrap with soup yesterday.   Wife notes decline in food intake has occurred in the last 4-5 days.   Last bm reported today with looser and darker appearance.  Wife unable to recall if stool is black and tarry.   Daughter reports a history of esophageal ulcer in the last year that resulted in a hospitalization.   Reviewed MOST form and patient, wife and daughter affirm comfort care with DNR in place.  We further discussed hospice support and criteria.  Patient and family are in agreement to move forward with hospice if patient meets the criteria.  Advised case would be reviewed by [name] and [name] for eligibility.   1224 pm. Spoke with [name] by telephone and advised of decline and pain management needs.  Okay for patient to take 50 mg of Tramadol up to 3 x daily.  Will review case with [name] for hospice eligibility.   220 pm.  Per [name], patient does  not meet hospice criteria.  RN will continue with weekly visits to monitor for ongoing decline.   224 pm.  Phone call made to [name] to advise of above.  RN visit scheduled for next Wednesday at 74 pm.    HISTORY OF PRESENT ILLNESS:  87 year old male with COPD, CHF, DM, Atrial fibrillation, and HTN.  Patient is being followed by Palliative Care every 8-12 weeks and PRN.   CODE STATUS: DNR ADVANCED DIRECTIVES: Yes MOST FORM: Yes PPS: 40%   PHYSICAL EXAM:   VITALS: Today's Vitals   09/12/21 1135  BP: 138/60  Pulse: 76  Temp: 98.1 F (36.7 C)  SpO2: 95%    LUNGS: decreased breath sounds CARDIAC: Cor RRR}  EXTREMITIES:- for edema SKIN: intact NEURO: positive for gait problems, memory problems, and weakness  Time: 1130 am- 1245 pm.       [name], RN

## 2021-09-13 ENCOUNTER — Encounter: Payer: Self-pay | Admitting: Family Medicine

## 2021-09-13 ENCOUNTER — Telehealth: Payer: Self-pay

## 2021-09-13 NOTE — Telephone Encounter (Signed)
Please call and give order for hospice eval given change in status.   Please check in with his wife.  Thanks.

## 2021-09-13 NOTE — Telephone Encounter (Signed)
Latoya with [facility] [phone]) called asking would Dr Damita Dunnings serve as pt Hospice Attending.

## 2021-09-13 NOTE — Telephone Encounter (Signed)
Spoke with Almyra Free at hospice and gave verbal orders.

## 2021-09-13 NOTE — Telephone Encounter (Signed)
Called and LMTCB. 

## 2021-09-13 NOTE — Telephone Encounter (Signed)
Dr. Damita Dunnings will still be attending physician for patient; [name] Small has been notified.
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
## 2021-09-13 NOTE — Telephone Encounter (Signed)
[name] Small with Hospice called  Pt has declined the last 24 hours and she is wanting orders for Hospice care  Please call [phone]

## 2021-09-13 NOTE — Telephone Encounter (Signed)
254 pm.  Incoming call from [address] with Dr. Josefine Class office.  MD is aware of patient's decline and agrees to hospice referral.

## 2021-09-13 NOTE — Telephone Encounter (Signed)
Almyra Free with hospice returning call

## 2021-09-14 ENCOUNTER — Encounter: Payer: Self-pay | Admitting: Family Medicine
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
## 2021-09-15 ENCOUNTER — Inpatient Hospital Stay (HOSPITAL_COMMUNITY)
Admission: EM | Admit: 2021-09-15 | Discharge: 2021-09-18 | DRG: 871 | Disposition: A | Discharge status: Hospice | Payer: Medicare Other | Attending: Internal Medicine | Admitting: Internal Medicine

## 2021-09-15 ENCOUNTER — Emergency Department (HOSPITAL_COMMUNITY): Payer: Medicare Other

## 2021-09-15 ENCOUNTER — Encounter (HOSPITAL_COMMUNITY): Payer: Self-pay

## 2021-09-15 ENCOUNTER — Other Ambulatory Visit: Payer: Self-pay

## 2021-09-15 DIAGNOSIS — L408 Other psoriasis: Secondary | ICD-10-CM | POA: Diagnosis present

## 2021-09-15 DIAGNOSIS — Z20822 Contact with and (suspected) exposure to covid-19: Secondary | ICD-10-CM | POA: Diagnosis present

## 2021-09-15 DIAGNOSIS — R339 Retention of urine, unspecified: Secondary | ICD-10-CM | POA: Diagnosis present

## 2021-09-15 DIAGNOSIS — L409 Psoriasis, unspecified: Secondary | ICD-10-CM | POA: Diagnosis present

## 2021-09-15 DIAGNOSIS — Z9981 Dependence on supplemental oxygen: Secondary | ICD-10-CM | POA: Diagnosis not present

## 2021-09-15 DIAGNOSIS — E1169 Type 2 diabetes mellitus with other specified complication: Secondary | ICD-10-CM | POA: Diagnosis present

## 2021-09-15 DIAGNOSIS — Z801 Family history of malignant neoplasm of trachea, bronchus and lung: Secondary | ICD-10-CM

## 2021-09-15 DIAGNOSIS — Z79899 Other long term (current) drug therapy: Secondary | ICD-10-CM

## 2021-09-15 DIAGNOSIS — Z951 Presence of aortocoronary bypass graft: Secondary | ICD-10-CM

## 2021-09-15 DIAGNOSIS — N179 Acute kidney failure, unspecified: Secondary | ICD-10-CM | POA: Diagnosis present

## 2021-09-15 DIAGNOSIS — I10 Essential (primary) hypertension: Secondary | ICD-10-CM | POA: Diagnosis present

## 2021-09-15 DIAGNOSIS — Z515 Encounter for palliative care: Secondary | ICD-10-CM | POA: Diagnosis not present

## 2021-09-15 DIAGNOSIS — I25810 Atherosclerosis of coronary artery bypass graft(s) without angina pectoris: Secondary | ICD-10-CM | POA: Diagnosis present

## 2021-09-15 DIAGNOSIS — D649 Anemia, unspecified: Secondary | ICD-10-CM | POA: Diagnosis present

## 2021-09-15 DIAGNOSIS — D630 Anemia in neoplastic disease: Secondary | ICD-10-CM | POA: Diagnosis not present

## 2021-09-15 DIAGNOSIS — E1165 Type 2 diabetes mellitus with hyperglycemia: Secondary | ICD-10-CM | POA: Diagnosis not present

## 2021-09-15 DIAGNOSIS — E1122 Type 2 diabetes mellitus with diabetic chronic kidney disease: Secondary | ICD-10-CM | POA: Diagnosis not present

## 2021-09-15 DIAGNOSIS — E785 Hyperlipidemia, unspecified: Secondary | ICD-10-CM | POA: Diagnosis present

## 2021-09-15 DIAGNOSIS — C679 Malignant neoplasm of bladder, unspecified: Secondary | ICD-10-CM | POA: Diagnosis not present

## 2021-09-15 DIAGNOSIS — N400 Enlarged prostate without lower urinary tract symptoms: Secondary | ICD-10-CM | POA: Diagnosis present

## 2021-09-15 DIAGNOSIS — A419 Sepsis, unspecified organism: Secondary | ICD-10-CM | POA: Diagnosis not present

## 2021-09-15 DIAGNOSIS — N3001 Acute cystitis with hematuria: Secondary | ICD-10-CM | POA: Diagnosis present

## 2021-09-15 DIAGNOSIS — J449 Chronic obstructive pulmonary disease, unspecified: Secondary | ICD-10-CM | POA: Diagnosis not present

## 2021-09-15 DIAGNOSIS — K2100 Gastro-esophageal reflux disease with esophagitis, without bleeding: Secondary | ICD-10-CM | POA: Diagnosis present

## 2021-09-15 DIAGNOSIS — M2550 Pain in unspecified joint: Secondary | ICD-10-CM | POA: Diagnosis not present

## 2021-09-15 DIAGNOSIS — J9601 Acute respiratory failure with hypoxia: Secondary | ICD-10-CM | POA: Diagnosis present

## 2021-09-15 DIAGNOSIS — N1832 Chronic kidney disease, stage 3b: Secondary | ICD-10-CM | POA: Diagnosis present

## 2021-09-15 DIAGNOSIS — R410 Disorientation, unspecified: Secondary | ICD-10-CM | POA: Diagnosis not present

## 2021-09-15 DIAGNOSIS — E1151 Type 2 diabetes mellitus with diabetic peripheral angiopathy without gangrene: Secondary | ICD-10-CM | POA: Diagnosis not present

## 2021-09-15 DIAGNOSIS — E875 Hyperkalemia: Secondary | ICD-10-CM | POA: Diagnosis present

## 2021-09-15 DIAGNOSIS — R6520 Severe sepsis without septic shock: Secondary | ICD-10-CM | POA: Diagnosis present

## 2021-09-15 DIAGNOSIS — Z743 Need for continuous supervision: Secondary | ICD-10-CM | POA: Diagnosis not present

## 2021-09-15 DIAGNOSIS — G8929 Other chronic pain: Secondary | ICD-10-CM | POA: Diagnosis present

## 2021-09-15 DIAGNOSIS — Z96642 Presence of left artificial hip joint: Secondary | ICD-10-CM | POA: Diagnosis present

## 2021-09-15 DIAGNOSIS — G9340 Encephalopathy, unspecified: Secondary | ICD-10-CM | POA: Diagnosis not present

## 2021-09-15 DIAGNOSIS — R404 Transient alteration of awareness: Secondary | ICD-10-CM | POA: Diagnosis not present

## 2021-09-15 DIAGNOSIS — Z833 Family history of diabetes mellitus: Secondary | ICD-10-CM | POA: Diagnosis not present

## 2021-09-15 DIAGNOSIS — I5032 Chronic diastolic (congestive) heart failure: Secondary | ICD-10-CM | POA: Diagnosis not present

## 2021-09-15 DIAGNOSIS — R739 Hyperglycemia, unspecified: Secondary | ICD-10-CM | POA: Diagnosis not present

## 2021-09-15 DIAGNOSIS — E11649 Type 2 diabetes mellitus with hypoglycemia without coma: Secondary | ICD-10-CM | POA: Diagnosis not present

## 2021-09-15 DIAGNOSIS — Z8249 Family history of ischemic heart disease and other diseases of the circulatory system: Secondary | ICD-10-CM

## 2021-09-15 DIAGNOSIS — Z66 Do not resuscitate: Secondary | ICD-10-CM | POA: Diagnosis not present

## 2021-09-15 DIAGNOSIS — Z7951 Long term (current) use of inhaled steroids: Secondary | ICD-10-CM

## 2021-09-15 DIAGNOSIS — J9811 Atelectasis: Secondary | ICD-10-CM | POA: Diagnosis not present

## 2021-09-15 DIAGNOSIS — J438 Other emphysema: Secondary | ICD-10-CM

## 2021-09-15 DIAGNOSIS — D7589 Other specified diseases of blood and blood-forming organs: Secondary | ICD-10-CM | POA: Diagnosis present

## 2021-09-15 DIAGNOSIS — I700 Atherosclerosis of aorta: Secondary | ICD-10-CM | POA: Diagnosis not present

## 2021-09-15 DIAGNOSIS — I2510 Atherosclerotic heart disease of native coronary artery without angina pectoris: Secondary | ICD-10-CM | POA: Diagnosis present

## 2021-09-15 DIAGNOSIS — I4891 Unspecified atrial fibrillation: Secondary | ICD-10-CM | POA: Diagnosis not present

## 2021-09-15 DIAGNOSIS — Z794 Long term (current) use of insulin: Secondary | ICD-10-CM

## 2021-09-15 DIAGNOSIS — I130 Hypertensive heart and chronic kidney disease with heart failure and stage 1 through stage 4 chronic kidney disease, or unspecified chronic kidney disease: Secondary | ICD-10-CM | POA: Diagnosis present

## 2021-09-15 DIAGNOSIS — I480 Paroxysmal atrial fibrillation: Secondary | ICD-10-CM | POA: Diagnosis present

## 2021-09-15 DIAGNOSIS — Z7401 Bed confinement status: Secondary | ICD-10-CM | POA: Diagnosis not present

## 2021-09-15 DIAGNOSIS — Z87891 Personal history of nicotine dependence: Secondary | ICD-10-CM

## 2021-09-15 DIAGNOSIS — G9341 Metabolic encephalopathy: Secondary | ICD-10-CM | POA: Diagnosis not present

## 2021-09-15 DIAGNOSIS — R0600 Dyspnea, unspecified: Secondary | ICD-10-CM | POA: Diagnosis not present

## 2021-09-15 DIAGNOSIS — I499 Cardiac arrhythmia, unspecified: Secondary | ICD-10-CM | POA: Diagnosis not present

## 2021-09-15 DIAGNOSIS — N390 Urinary tract infection, site not specified: Secondary | ICD-10-CM | POA: Diagnosis present

## 2021-09-15 DIAGNOSIS — R0902 Hypoxemia: Secondary | ICD-10-CM | POA: Diagnosis not present

## 2021-09-15 LAB — COMPREHENSIVE METABOLIC PANEL
ALT: 11 U/L (ref 0–44)
AST: 12 U/L — ABNORMAL LOW (ref 15–41)
Albumin: 3 g/dL — ABNORMAL LOW (ref 3.5–5.0)
Alkaline Phosphatase: 63 U/L (ref 38–126)
Anion gap: 7 (ref 5–15)
BUN: 49 mg/dL — ABNORMAL HIGH (ref 8–23)
CO2: 37 mmol/L — ABNORMAL HIGH (ref 22–32)
Calcium: 8.4 mg/dL — ABNORMAL LOW (ref 8.9–10.3)
Chloride: 90 mmol/L — ABNORMAL LOW (ref 98–111)
Creatinine, Ser: 2.14 mg/dL — ABNORMAL HIGH (ref 0.61–1.24)
GFR, Estimated: 29 mL/min — ABNORMAL LOW (ref >60)
Glucose, Bld: 412 mg/dL — ABNORMAL HIGH (ref 70–99)
Potassium: 5.8 mmol/L — ABNORMAL HIGH (ref 3.5–5.1)
Sodium: 134 mmol/L — ABNORMAL LOW (ref 135–145)
Total Bilirubin: 0.6 mg/dL (ref 0.3–1.2)
Total Protein: 6.4 g/dL — ABNORMAL LOW (ref 6.5–8.1)

## 2021-09-15 LAB — URINALYSIS, ROUTINE W REFLEX MICROSCOPIC
Bilirubin Urine: NEGATIVE
Glucose, UA: >=500 mg/dL — AB
Ketones, ur: NEGATIVE mg/dL
Nitrite: NEGATIVE
Protein, ur: 30 mg/dL — AB
Specific Gravity, Urine: 1.013 (ref 1.005–1.030)
WBC, UA: >50 WBC/hpf — ABNORMAL HIGH (ref 0–5)
pH: 5 (ref 5.0–8.0)

## 2021-09-15 LAB — LACTIC ACID, PLASMA
Lactic Acid, Venous: 0.9 mmol/L (ref 0.5–1.9)
Lactic Acid, Venous: 1.2 mmol/L (ref 0.5–1.9)

## 2021-09-15 LAB — RESP PANEL BY RT-PCR (FLU A&B, COVID) ARPGX2
Influenza A by PCR: NEGATIVE
Influenza B by PCR: NEGATIVE
SARS Coronavirus 2 by RT PCR: NEGATIVE

## 2021-09-15 LAB — CBC WITH DIFFERENTIAL/PLATELET
Abs Immature Granulocytes: 0.2 10*3/uL — ABNORMAL HIGH (ref 0.00–0.07)
Basophils Absolute: 0.1 10*3/uL (ref 0.0–0.1)
Basophils Relative: 1 %
Eosinophils Absolute: 0.1 10*3/uL (ref 0.0–0.5)
Eosinophils Relative: 1 %
HCT: 37.6 % — ABNORMAL LOW (ref 39.0–52.0)
Hemoglobin: 11.3 g/dL — ABNORMAL LOW (ref 13.0–17.0)
Immature Granulocytes: 2 %
Lymphocytes Relative: 9 %
Lymphs Abs: 0.9 10*3/uL (ref 0.7–4.0)
MCH: 32.5 pg (ref 26.0–34.0)
MCHC: 30.1 g/dL (ref 30.0–36.0)
MCV: 108 fL — ABNORMAL HIGH (ref 80.0–100.0)
Monocytes Absolute: 1.1 10*3/uL — ABNORMAL HIGH (ref 0.1–1.0)
Monocytes Relative: 11 %
Neutro Abs: 7.6 10*3/uL (ref 1.7–7.7)
Neutrophils Relative %: 76 %
Platelets: 205 10*3/uL (ref 150–400)
RBC: 3.48 MIL/uL — ABNORMAL LOW (ref 4.22–5.81)
RDW: 13.2 % (ref 11.5–15.5)
WBC: 10 10*3/uL (ref 4.0–10.5)
nRBC: 0 % (ref 0.0–0.2)

## 2021-09-15 LAB — PROTIME-INR
INR: 1.1 (ref 0.8–1.2)
Prothrombin Time: 13.9 seconds (ref 11.4–15.2)

## 2021-09-15 LAB — CBG MONITORING, ED: Glucose-Capillary: 404 mg/dL — ABNORMAL HIGH (ref 70–99)

## 2021-09-15 LAB — APTT: aPTT: 33 seconds (ref 24–36)

## 2021-09-15 LAB — GLUCOSE, CAPILLARY: Glucose-Capillary: 272 mg/dL — ABNORMAL HIGH (ref 70–99)

## 2021-09-15 LAB — OCCULT BLOOD X 1 CARD TO LAB, STOOL: Fecal Occult Bld: NEGATIVE

## 2021-09-15 MED ORDER — SODIUM POLYSTYRENE SULFONATE 15 GM/60ML PO SUSP
15.0000 g | Freq: Once | ORAL | Status: DC
  Filled 2021-09-15: qty 60

## 2021-09-15 MED ORDER — SODIUM ZIRCONIUM CYCLOSILICATE 5 G PO PACK
5.0000 g | PACK | ORAL | Status: AC
  Administered 2021-09-15: 5 g via ORAL
  Filled 2021-09-15: qty 1

## 2021-09-15 MED ORDER — SODIUM CHLORIDE 0.9 % IV SOLN
2.0000 g | INTRAVENOUS | Status: DC
  Administered 2021-09-15 – 2021-09-16 (×2): 2 g via INTRAVENOUS
  Filled 2021-09-15 (×2): qty 20

## 2021-09-15 MED ORDER — INSULIN ASPART 100 UNIT/ML IJ SOLN: 0.0000 [IU] | Freq: Three times a day (TID) | INTRAMUSCULAR | Status: DC

## 2021-09-15 MED ORDER — APREMILAST 30 MG PO TABS
1.0000 | ORAL_TABLET | Freq: Every day | ORAL | Status: DC
Start: 2021-09-16 — End: 2021-09-16

## 2021-09-15 MED ORDER — MOMETASONE FURO-FORMOTEROL FUM 200-5 MCG/ACT IN AERO
2.0000 | INHALATION_SPRAY | Freq: Two times a day (BID) | RESPIRATORY_TRACT | Status: DC
  Filled 2021-09-15: qty 8.8

## 2021-09-15 MED ORDER — INSULIN ASPART 100 UNIT/ML IJ SOLN
0.0000 [IU] | Freq: Every day | INTRAMUSCULAR | Status: DC
  Administered 2021-09-16: 3 [IU] via SUBCUTANEOUS

## 2021-09-15 MED ORDER — TAMSULOSIN HCL 0.4 MG PO CAPS
0.8000 mg | ORAL_CAPSULE | Freq: Every day | ORAL | Status: DC
  Filled 2021-09-15: qty 2

## 2021-09-15 MED ORDER — POLYETHYLENE GLYCOL 3350 17 G PO PACK: 17.0000 g | PACK | Freq: Every day | ORAL | Status: DC | PRN

## 2021-09-15 MED ORDER — TRAMADOL HCL 50 MG PO TABS: 50.0000 mg | ORAL_TABLET | Freq: Three times a day (TID) | ORAL | Status: DC | PRN

## 2021-09-15 MED ORDER — ACETAMINOPHEN 650 MG RE SUPP: 650.0000 mg | Freq: Four times a day (QID) | RECTAL | Status: DC | PRN

## 2021-09-15 MED ORDER — ACETAMINOPHEN 325 MG PO TABS: 650.0000 mg | ORAL_TABLET | Freq: Four times a day (QID) | ORAL | Status: DC | PRN

## 2021-09-15 MED ORDER — PANTOPRAZOLE SODIUM 40 MG PO TBEC: 40.0000 mg | DELAYED_RELEASE_TABLET | Freq: Every day | ORAL | Status: DC

## 2021-09-15 MED ORDER — TIOTROPIUM BROMIDE MONOHYDRATE 18 MCG IN CAPS
18.0000 ug | ORAL_CAPSULE | Freq: Every day | RESPIRATORY_TRACT | Status: DC
Start: 2021-09-16 — End: 2021-09-15

## 2021-09-15 MED ORDER — ATORVASTATIN CALCIUM 10 MG PO TABS: 10.0000 mg | ORAL_TABLET | Freq: Every day | ORAL | Status: DC

## 2021-09-15 MED ORDER — ENOXAPARIN SODIUM 30 MG/0.3ML IJ SOSY
30.0000 mg | PREFILLED_SYRINGE | INTRAMUSCULAR | Status: DC
  Administered 2021-09-15 – 2021-09-16 (×2): 30 mg via SUBCUTANEOUS
  Filled 2021-09-15 (×2): qty 0.3

## 2021-09-15 MED ORDER — INSULIN GLARGINE-YFGN 100 UNIT/ML ~~LOC~~ SOLN
12.0000 [IU] | Freq: Every day | SUBCUTANEOUS | Status: DC
  Administered 2021-09-16: 12 [IU] via SUBCUTANEOUS
  Filled 2021-09-15 (×4): qty 0.12

## 2021-09-15 MED ORDER — SODIUM CHLORIDE 0.9 % IV SOLN: INTRAVENOUS | Status: DC

## 2021-09-15 MED ORDER — FINASTERIDE 5 MG PO TABS: 5.0000 mg | ORAL_TABLET | Freq: Every day | ORAL | Status: DC

## 2021-09-15 MED ORDER — LACTATED RINGERS IV SOLN: INTRAVENOUS | Status: DC

## 2021-09-15 MED ORDER — SODIUM CHLORIDE 0.9% FLUSH
3.0000 mL | Freq: Two times a day (BID) | INTRAVENOUS | Status: DC
  Administered 2021-09-15 – 2021-09-18 (×6): 3 mL via INTRAVENOUS

## 2021-09-15 MED ORDER — UMECLIDINIUM BROMIDE 62.5 MCG/ACT IN AEPB
1.0000 | INHALATION_SPRAY | Freq: Every day | RESPIRATORY_TRACT | Status: DC
  Filled 2021-09-15: qty 7

## 2021-09-15 NOTE — ED Notes (Signed)
Second Round of Blood Cultures collected after 1st dose of antibiotics

## 2021-09-15 NOTE — ED Provider Notes (Signed)
[facility] EMERGENCY DEPARTMENT Provider Note   CSN: [id number] Arrival date & time: 09/15/21  1741     History Chief Complaint  Patient presents with   Altered Mental Status    [name] Small is a 87 y.o. male.   Altered Mental Status  This patient is a 87 year old male, he has a history of heart failure with a preserved ejection fraction, history of bladder cancer, he does do self caths, history of coronary disease with an ejection of 50 to 55%.  He is known to have an infection of his left foot which required partial foot amputation several years ago and has stage III chronic kidney disease.  He is also known to have COPD on 2.5 L of oxygen chronically as well as diabetes.  He has hypertension, according to the family member who is the primary historian because of the patient's altered mental status he has had some progressive confusion over the last several days, he has not been wanting to eat or drink anything, he has been progressively weak and actually slid to the ground requiring the paramedics and firefighters to come lift him back up into the bed several days ago.  He had a visit from the palliative care team last week, seem to be doing okay though it was noted that he had decreased intake, they applied for hospice but hospice came to evaluate him and said that he was borderline and was not approved for hospice.  When the family members called the family doctor today they recommended that he come to the hospital for evaluation due to likely dehydration and probably infection in the urine.  The daughter at the bedside states that she has been doing the In-N-Out catheterizations and noted that it starting to smell bad, become more cloudy and concentrated and today there was frank blood.  Past Medical History:  Diagnosis Date   (HFpEF) heart failure with preserved ejection fraction (HCC)    Arthritis    Back pain    s/p lumbar injection 2014   BENIGN PROSTATIC  HYPERTROPHY 05/21/2007   Bladder cancer ([place]) 10/2011   CAD (coronary artery disease)    nonobstructive by cath 6/12:  mid LAD 30%, proximal obtuse marginal-2 30%, proximal RCA 20%, mid RCA 30-40%.  He had normal cardiac output and mildly elevated filling pressures but no significant pulmonary hypertension;   Echocardiogram in May 2012 demonstrated EF 50-55% and left atrial enlargement    Cellulitis of left foot    Hospitalized in 2006   Chronic kidney disease (CKD), stage III (moderate) (HCC) 12/04/2007   FOLLOWED BY DR PATEL   Chronic pain of lower extremity    Complication of anesthesia    1 time bladder cancer surgery- 2012 , oxygen satruratioon dropped had to stay overnight, no problems since then.   COPD (chronic obstructive pulmonary disease) ([place])    DIABETES MELLITUS, TYPE II 05/21/2007   DIVERTICULOSIS, COLON 05/21/2007   pt denies   Dyspnea    with exertion, patient mointors saturation   Fatigue AGE-RELATED   Gross hematuria    pt denies   HYPERTENSION 05/21/2007   Impaired hearing BILATERAL HEARING AIDS   On home oxygen therapy    as needed   PAD (peripheral artery disease) ([place])    a. 02/2016 L foot nonhealing ulcer-->Periph angio: L pop 100 w/ reconstitution via extensive vollats to the prox-mid peroneal (only patent vessel BK)-->PTA of L Pop and peroneal; b. Ongoing LE ischemia 5 & 06/2016 req  amputation of toes on L foot.   Pneumonia    Hx  of    Psoriasis ELBOWS   PSORIASIS, SCALP 10/25/2008    Patient Active Problem List   Diagnosis Date Noted   Goals of care, counseling/discussion 06/14/2021   Aspiration pneumonia ([place]) 06/13/2021   Demand ischemia ([place]) 06/13/2021   Chronic kidney disease, stage 3b ([place]) 06/13/2021   Chronic cystitis 06/13/2021   Ulcer of foot ([place]) 11/07/2020   Recurrent UTI 07/31/2020   Insomnia 05/25/2020   Advance care planning    Gastroesophageal reflux disease with esophagitis without hemorrhage    Ulcer of esophagus without  bleeding    Urinary retention with incomplete bladder emptying 05/10/2020   Adjustment reaction of late life    Acute kidney injury ([place])    Gross hematuria    Type 2 diabetes mellitus with other specified complication (HCC)    Benign essential HTN    Anxiety 12/15/2019   Anemia 01/31/2017   Acute respiratory failure with hypoxia and hypercapnia (HCC)    Status post lumbar laminectomy 12/26/2016    Class: Chronic   History of femur fracture 12/26/2016   Idiopathic chronic venous hypertension of left lower extremity with inflammation 11/07/2016   Spinal stenosis of lumbar region with neurogenic claudication 10/18/2016   COPD (chronic obstructive pulmonary disease) ([place]) 06/10/2016   Weakness generalized 03/18/2016   Critical lower limb ischemia ([place]) 02/28/2016   Peripheral arterial disease ([place]) 10/17/2015   Edema 07/19/2015   Claudication ([place]) 01/31/2015   DOE (dyspnea on exertion) 01/31/2015   Hyperlipidemia 01/31/2015   HTN (hypertension) 01/31/2015   Acute on chronic diastolic CHF (congestive heart failure) ([place]) 01/21/2014   Atrial fibrillation ([place]) 12/16/2012   Bladder cancer ([place]) 09/22/2011   Leg cramps 08/28/2011   Cough 05/26/2011   CAD (coronary artery disease) of artery bypass graft 05/09/2011   Dyspnea on exertion 04/19/2011   PSORIASIS, SCALP 10/25/2008   Chronic kidney disease, stage III (moderate) ([place]) 12/04/2007   DMII (diabetes mellitus, type 2) ([place]) 05/21/2007   DIVERTICULOSIS, COLON 05/21/2007   BPH (benign prostatic hyperplasia) 05/21/2007    Past Surgical History:  Procedure Laterality Date   AMPUTATION Left 03/27/2016   Procedure: partial first AMPUTATION RAY; LEFT;  Surgeon: Trula Slade, DPM;  Location: [place];  Service: Podiatry;  Laterality: Left;   AMPUTATION Left 06/12/2016   Procedure: TRANSMETATARSAL AMPUTATION LEFT FOOT;  Surgeon: Newt Minion, MD;  Location: [place];  Service: Orthopedics;  Laterality: Left;   APPENDECTOMY  1941    BIOPSY  05/16/2020   Procedure: BIOPSY;  Surgeon: Lavena Bullion, DO;  Location: [facility];  Service: Gastroenterology;;   CARDIAC CATHETERIZATION  04-24-11/  DR Rogue Jury ARIDA   MILD NONOBSTRUCTIVE CAD, NORMA CARDIAC OUTPUT   CARDIOVASCULAR STRESS TEST  2007   CATARACT EXTRACTION W/ INTRAOCULAR LENS  IMPLANT, BILATERAL Bilateral ~ 2010   CYSTOSCOPY  12/25/2011   Procedure: CYSTOSCOPY;  Surgeon: Molli Hazard, MD;  Location[facility];  Service: Urology;  Laterality: N/A;  needs intubation and to be paralyzed    CYSTOSCOPY W/ RETROGRADES  10/30/2011   Procedure: CYSTOSCOPY WITH RETROGRADE PYELOGRAM;  Surgeon: Molli Hazard, MD;  Location: [facility];  Service: Urology;  Laterality: Bilateral;  CYSTOSCOPY POSS TURBT BILATERAL RETROGRADE PYLEOGRAM    CYSTOSCOPY W/ RETROGRADES  11/30/2012   Procedure: CYSTOSCOPY WITH RETROGRADE PYELOGRAM;  Surgeon: Molli Hazard, MD;  Location: [facility];  Service: Urology;  Laterality: Bilateral;  Flexible cystoscopy.   CYSTOSCOPY WITH BIOPSY  11/30/2012   Procedure: CYSTOSCOPY WITH BIOPSY;  Surgeon: Molli Hazard, MD;  Location: [facility];  Service: Urology;  Laterality: N/A;   ESOPHAGOGASTRODUODENOSCOPY (EGD) WITH PROPOFOL N/A 05/16/2020   Procedure: ESOPHAGOGASTRODUODENOSCOPY (EGD) WITH PROPOFOL;  Surgeon: Lavena Bullion, DO;  Location: [facility];  Service: Gastroenterology;  Laterality: N/A;   FEMUR IM NAIL Left 08/01/2020   Procedure: INTRAMEDULLARY (IM) RETROGRADE FEMORAL NAILING;  Surgeon: Altamese Pea Ridge, MD;  Location: [place];  Service: Orthopedics;  Laterality: Left;   INCISION AND DRAINAGE FOOT Left ~ 2005   LEFT FOOT DUE TO INFECTION FROM  NAIL PUNCTURE INJURY   LUMBAR LAMINECTOMY/DECOMPRESSION MICRODISCECTOMY N/A 10/18/2016   Procedure: LEFT AND CENTRAL L4-5 LUMBAR LAMINECTOMY WITH RESECTION OF SYNOVIAL CYST;  Surgeon: Jessy Oto, MD;  Location:  [place];  Service: Orthopedics;  Laterality: N/A;   ORIF FEMUR FRACTURE Left 04/19/2020   Procedure: OPEN REDUCTION INTERNAL FIXATION FEMORAL SHAFT FRACTURE;  Surgeon: Shona Needles, MD;  Location: [place];  Service: Orthopedics;  Laterality: Left;   PENILE PROSTHESIS IMPLANT  1990s   PERIPHERAL VASCULAR CATHETERIZATION N/A 02/14/2016   Procedure: Abdominal Aortogram w/Lower Extremity;  Surgeon: Wellington Hampshire, MD;  Location: [place] [facility];  Service: Cardiovascular;  Laterality: N/A;   PERIPHERAL VASCULAR CATHETERIZATION Left 02/28/2016   Procedure: Peripheral Vascular Balloon Angioplasty;  Surgeon: Wellington Hampshire, MD;  Location: [place] [facility];  Service: Cardiovascular;  Laterality: Left;  left peroneal and popliteal artery   TONSILLECTOMY AND ADENOIDECTOMY  1962   TOTAL HIP ARTHROPLASTY Left 12/27/2016   Procedure: LEFT TOTAL HIP ARTHROPLASTY ANTERIOR APPROACH;  Surgeon: Mcarthur Rossetti, MD;  Location: [facility];  Service: Orthopedics;  Laterality: Left;   TRANSURETHRAL RESECTION OF BLADDER TUMOR  10/30/2011   Procedure: TRANSURETHRAL RESECTION OF BLADDER TUMOR (TURBT);  Surgeon: Molli Hazard, MD;  Location: [facility];  Service: Urology;  Laterality: N/A;   TRANSURETHRAL RESECTION OF BLADDER TUMOR  12/25/2011   Procedure: TRANSURETHRAL RESECTION OF BLADDER TUMOR (TURBT);  Surgeon: Molli Hazard, MD;  Location: [facility];  Service: Urology;  Laterality: N/A;  need long gyrus instruments    VASECTOMY  1990s       Family History  Problem Relation Age of Onset   Diabetes Mother    Heart disease Brother    Heart disease Brother    Heart disease Brother    Heart disease Brother    Heart disease Brother    Heart disease Brother    Lung cancer Brother     Social History   Tobacco Use   Smoking status: Former    Packs/day: 2.00    Years: 22.00    Pack years: 44.00    Types: Cigarettes    Quit date: 11/11/1968    Years since  quitting: 52.8   Smokeless tobacco: Never  Vaping Use   Vaping Use: Never used  Substance Use Topics   Alcohol use: Yes    Comment: occassional- 10/17/16- none  in 4 months- "too sick"   Drug use: No    Home Medications Prior to Admission medications   Medication Sig Start Date End Date Taking? Authorizing Provider  acetaminophen (TYLENOL) 325 MG tablet Take 1-2 tablets (325-650 mg total) by mouth every 4 (four) hours as needed for mild pain. 05/11/20   Angiulli, Lavon Paganini, PA-C  Apremilast 30 MG TABS Take 1 tablet by mouth daily. 03/30/19  Tonia Ghent, MD  atorvastatin (LIPITOR) 10 MG tablet TAKE 1 TABLET (10 MG TOTAL) BY MOUTH DAILY. 08/23/21   Tonia Ghent, MD  cephALEXin (KEFLEX) 500 MG capsule Take 1 capsule (500 mg total) by mouth 3 (three) times daily. Patient not taking: Reported on 07/20/2021 07/10/21   Tonia Ghent, MD  Cholecalciferol (VITAMIN D) 125 MCG (5000 UT) CAPS Take 1 capsule by mouth daily. 08/08/20   Ainsley Spinner, PA-C  clobetasol (TEMOVATE) 0.05 % external solution Apply 1 application topically daily as needed (rash).     [provider]  CRANBERRY PO Take 4 capsules by mouth daily. Takes AZO 2 tabs BID    [provider]  docusate sodium (COLACE) 100 MG capsule Take 100 mg by mouth daily as needed for mild constipation. Patient not taking: Reported on 07/20/2021    [provider]  doxycycline (ADOXA) 50 MG tablet Take 50 mg by mouth daily.    [provider]  finasteride (PROSCAR) 5 MG tablet Take 5 mg by mouth daily.    [provider]  fluticasone-salmeterol (ADVAIR) 250-50 MCG/ACT AEPB Take 1 puff by mouth 2 (two) times daily as needed (shortness of breath). 01/16/21   [provider]  furosemide (LASIX) 20 MG tablet Take 1 tablet (20 mg total) by mouth daily as needed for edema. 07/20/21   Furth, Cadence H, PA-C  insulin glargine (LANTUS) 100 UNIT/ML Solostar Pen Inject 15 Units into the skin daily. 03/23/21    Tonia Ghent, MD  Multiple Vitamin (MULTIVITAMIN WITH MINERALS) TABS tablet Take 1 tablet by mouth daily. 05/11/20   Angiulli, Lavon Paganini, PA-C  omeprazole (PRILOSEC) 40 MG capsule TAKE 1 CAPSULE BY MOUTH DAILY. 08/15/21   Tonia Ghent, MD  OXYGEN Inhale 2.5 L into the lungs.    [provider]  tamsulosin (FLOMAX) 0.4 MG CAPS capsule TAKE 2 CAPSULES BY MOUTH DAILY AFTER SUPPER. 08/15/21   Tonia Ghent, MD  tiotropium (SPIRIVA) 18 MCG inhalation capsule Place 18 mcg into inhaler and inhale daily.    [provider]  traMADol (ULTRAM) 50 MG tablet Take 1 tablet (50 mg total) by mouth every 8 (eight) hours as needed for moderate pain or severe pain. 08/08/20   Ainsley Spinner, PA-C  traZODone (DESYREL) 50 MG tablet TAKE 1/2 TABLET BY MOUTH AT BEDTIME AS NEEDED FOR SLEEP. 08/22/21   Tonia Ghent, MD  calcium carbonate (OS-CAL - DOSED IN MG OF ELEMENTAL CALCIUM) 1250 (500 Ca) MG tablet Take 1 tablet (500 mg of elemental calcium total) by mouth daily with breakfast. Patient not taking: No sig reported 05/11/20 06/11/21  Angiulli, Lavon Paganini, PA-C  escitalopram (LEXAPRO) 5 MG tablet Take 1 tablet (5 mg total) by mouth daily. Patient not taking: No sig reported 05/11/20 06/11/21  Angiulli, Lavon Paganini, PA-C    Allergies    Actos [pioglitazone hydrochloride], Aspirin, Ace inhibitors, Bactrim, Gabapentin, Lyrica [pregabalin], Pravastatin, and Sulfa drugs cross reactors  Review of Systems   Review of Systems  Unable to perform ROS: Mental status change   Physical Exam Updated Vital Signs BP (!) 144/126   Pulse 86   Temp 98.6 F (37 C) (Oral)   Resp (!) 21   Ht 1.88 m (6\' 2" )   Wt 93 kg   SpO2 92%   BMI 26.32 kg/m   Physical Exam Vitals and nursing note reviewed.  Constitutional:      General: He is not in acute distress.    Appearance:  He is well-developed. He is ill-appearing.  HENT:     Head: Normocephalic and atraumatic.     Nose: Nose normal.     Mouth/Throat:      Mouth: Mucous membranes are dry.     Pharynx: No oropharyngeal exudate.  Eyes:     General: No scleral icterus.       Right eye: No discharge.        Left eye: No discharge.     Conjunctiva/sclera: Conjunctivae normal.     Pupils: Pupils are equal, round, and reactive to light.  Neck:     Thyroid: No thyromegaly.     Vascular: No JVD.  Cardiovascular:     Rate and Rhythm: Normal rate and regular rhythm.     Heart sounds: Normal heart sounds. No murmur heard.   No friction rub. No gallop.  Pulmonary:     Effort: Pulmonary effort is normal. No respiratory distress.     Breath sounds: Rales (scattered rales and ronchi - no distress) present. No wheezing.  Abdominal:     General: Bowel sounds are normal. There is no distension.     Palpations: Abdomen is soft. There is no mass.     Tenderness: There is no abdominal tenderness.  Genitourinary:    Comments: Normal-appearing penis scrotum and testicles Musculoskeletal:        General: No tenderness. Normal range of motion.     Cervical back: Normal range of motion and neck supple.     Right lower leg: No edema.     Left lower leg: No edema.     Comments: Left foot appears healthy without signs of breakdown at the amputation site  Lymphadenopathy:     Cervical: No cervical adenopathy.  Skin:    General: Skin is warm and dry.     Findings: No erythema or rash.  Neurological:     Mental Status: He is alert.     Coordination: Coordination normal.     Comments: Myoclonic jerking present, both arms, able to follow simple commands such as squeezing my hands or moving his legs, no obvious facial droop, not answering questions verbally  Psychiatric:        Behavior: Behavior normal.    ED Results / Procedures / Treatments   Labs (all labs ordered are listed, but only abnormal results are displayed) Labs Reviewed  COMPREHENSIVE METABOLIC PANEL - Abnormal; Notable for the following components:      Result Value   Sodium 134 (*)     Potassium 5.8 (*)    Chloride 90 (*)    CO2 37 (*)    Glucose, Bld 412 (*)    BUN 49 (*)    Creatinine, Ser 2.14 (*)    Calcium 8.4 (*)    Total Protein 6.4 (*)    Albumin 3.0 (*)    AST 12 (*)    GFR, Estimated 29 (*)    All other components within normal limits  CBC WITH DIFFERENTIAL/PLATELET - Abnormal; Notable for the following components:   RBC 3.48 (*)    Hemoglobin 11.3 (*)    HCT 37.6 (*)    MCV 108.0 (*)    Monocytes Absolute 1.1 (*)    Abs Immature Granulocytes 0.20 (*)    All other components within normal limits  URINALYSIS, ROUTINE W REFLEX MICROSCOPIC - Abnormal; Notable for the following components:   Color, Urine AMBER (*)    APPearance CLOUDY (*)    Glucose, UA >=500 (*)  Hgb urine dipstick LARGE (*)    Protein, ur 30 (*)    Leukocytes,Ua LARGE (*)    WBC, UA >50 (*)    Bacteria, UA MANY (*)    Non Squamous Epithelial 0-5 (*)    All other components within normal limits  CBG MONITORING, ED - Abnormal; Notable for the following components:   Glucose-Capillary 404 (*)    All other components within normal limits  RESP PANEL BY RT-PCR (FLU A&B, COVID) ARPGX2  CULTURE, BLOOD (ROUTINE X 2)  CULTURE, BLOOD (ROUTINE X 2)  URINE CULTURE  LACTIC ACID, PLASMA  PROTIME-INR  APTT  LACTIC ACID, PLASMA    EKG EKG Interpretation  Date/Time:  Saturday September 15 2021 17:47:03 EDT Ventricular Rate:  86 PR Interval:  177 QRS Duration: 95 QT Interval:  378 QTC Calculation: 453 R Axis:   71 Text Interpretation: Sinus rhythm Atrial premature complex Confirmed by Noemi Chapel [phone]) on 09/15/2021 5:58:38 PM  Radiology DG Chest Port 1 View  Result Date: 09/15/2021 CLINICAL DATA:  Questionable sepsis. EXAM: PORTABLE CHEST 1 VIEW COMPARISON:  June 11, 2021 FINDINGS: Tortuosity and calcific atherosclerotic disease of the aorta. Cardiomediastinal silhouette is normal. Mediastinal contours appear intact. Subtle airspace consolidation versus atelectasis in the left  lung base. Osseous structures are without acute abnormality. Soft tissues are grossly normal. IMPRESSION: Subtle airspace consolidation versus atelectasis in the left lung base. Electronically Signed   By: Fidela Salisbury M.D.   On: 09/15/2021 19:10    Procedures Procedures   Medications Ordered in ED Medications  lactated ringers infusion (has no administration in time range)  cefTRIAXone (ROCEPHIN) 2 g in sodium chloride 0.9 % 100 mL IVPB (2 g Intravenous New Bag/Given 09/15/21 1958)  0.9 %  sodium chloride infusion (has no administration in time range)  sodium polystyrene (KAYEXALATE) 15 GM/60ML suspension 15 g (has no administration in time range)    ED Course  I have reviewed the triage vital signs and the nursing notes.  Pertinent labs & imaging results that were available during my care of the patient were reviewed by me and considered in my medical decision making (see chart for details).    MDM Rules/Calculators/A&P                           The patient's oxygen level seems to be normal, his lung sounds have scattered rales and rhonchi but in no distress.  His EKG is rather unremarkable, will proceed with chest x-ray and labs, he is hyperglycemic and I suspect some type of urinary source given the history of what is going on here.  That being said the patient is somewhat fragile, he is also close to being on hospice and has DNR orders.  We will give some gentle fluids while we await the results of his labs.  Weakness:  Likely multifactorial but UTI likely the nidus, giving fluids and antibiotics.  Also found to be slightly hyperkalemic with a slightly worsening renal function, again fluids and some Kayexalate have been ordered.  Will discuss with hospitalist for admission  Final Clinical Impression(s) / ED Diagnoses Final diagnoses:  Acute cystitis with hematuria  Acute encephalopathy  Hyperkalemia  AKI (acute kidney injury) ([place])     Noemi Chapel, MD 09/21/21  [id number]

## 2021-09-15 NOTE — H&P (Signed)
History and Physical   [name] Small TIW:[id number] DOB: January 19, 1928 DOA: 09/15/2021  PCP: Tonia Ghent, MD   Patient coming from: Home  Chief Complaint: Altered mental status  HPI: [name] Small is a 87 y.o. male with medical history significant of CHF, anemia, anxiety, atrial fibrillation, hypertension, hyperlipidemia, CAD status post CABG, CKD 3B, COPD on chronic oxygen, PAD, history of limb ischemia, GERD, spinal stenosis, diabetes, history of bladder cancer, chronic cystitis who presents with altered mental status. Patient presented with ongoing altered mental status, weakness and decreased p.o. intake.  History obtained with assistance of family chart review due to patient's confusion.  He does have some baseline confusion per report but this has been getting worse along with his weakness for the last few days.  Also has had decreased p.o. intake.  He has a history of recurrent UTI and bladder cancer and does self cath.  Family noticed increasing odor and cloudiness to the urine with his catheterization recently and also noticed blood in the urine today.    Denies chest pain, sob, abdominal pain, nausea.  Of note, does follow with palliative care.  Family states he had a visit recently and they were working towards getting him on hospice however he does not yet qualify.  Is DNR.  ED Course: Vital signs in the ED significant for respirate in the 20s and blood pressure in 250N 397Q systolic.  Lab work-up showed CMP with sodium 134 which corrects considering glucose of 412, chloride 90, potassium 5.8, bicarb 37 which is stable for him, BUN 48, creatinine elevated 2.14 from baseline of around 1.5.  Calcium 8.4, protein 6.4, albumin 3.0.  CBC with hemoglobin of 11.3 down from baseline of 14 macrocytosis.  PT, PTT, INR within normal limits.  Lactic acid normal with repeat pending.  Respiratory panel for flu and COVID pending.  Urinalysis showed glucose, hemoglobin, leukocytes, bacteria.   Urine culture and blood culture pending.  Chest x-ray showed subtle consolidation versus atelectasis at the left lung base.  Patient received dose ceftriaxone, Kayexalate and started on normal saline at 250 cc an hour in the ED.   Review of Systems: As per HPI otherwise all other systems reviewed and are negative.   Past Medical History:  Diagnosis Date   (HFpEF) heart failure with preserved ejection fraction (HCC)    Arthritis    Back pain    s/p lumbar injection 2014   BENIGN PROSTATIC HYPERTROPHY 05/21/2007   Bladder cancer ([place]) 10/2011   CAD (coronary artery disease)    nonobstructive by cath 6/12:  mid LAD 30%, proximal obtuse marginal-2 30%, proximal RCA 20%, mid RCA 30-40%.  He had normal cardiac output and mildly elevated filling pressures but no significant pulmonary hypertension;   Echocardiogram in May 2012 demonstrated EF 50-55% and left atrial enlargement    Cellulitis of left foot    Hospitalized in 2006   Chronic kidney disease (CKD), stage III (moderate) (HCC) 12/04/2007   FOLLOWED BY DR PATEL   Chronic pain of lower extremity    Complication of anesthesia    1 time bladder cancer surgery- 2012 , oxygen satruratioon dropped had to stay overnight, no problems since then.   COPD (chronic obstructive pulmonary disease) ([place])    DIABETES MELLITUS, TYPE II 05/21/2007   DIVERTICULOSIS, COLON 05/21/2007   pt denies   Dyspnea    with exertion, patient mointors saturation   Fatigue AGE-RELATED   Gross hematuria    pt denies   HYPERTENSION 05/21/2007  Impaired hearing BILATERAL HEARING AIDS   On home oxygen therapy    as needed   PAD (peripheral artery disease) ([place])    a. 02/2016 L foot nonhealing ulcer-->Periph angio: L pop 100 w/ reconstitution via extensive vollats to the prox-mid peroneal (only patent vessel BK)-->PTA of L Pop and peroneal; b. Ongoing LE ischemia 5 & 06/2016 req amputation of toes on L foot.   Pneumonia    Hx  of    Psoriasis ELBOWS   PSORIASIS,  SCALP 10/25/2008    Past Surgical History:  Procedure Laterality Date   AMPUTATION Left 03/27/2016   Procedure: partial first AMPUTATION RAY; LEFT;  Surgeon: Trula Slade, DPM;  Location: [place];  Service: Podiatry;  Laterality: Left;   AMPUTATION Left 06/12/2016   Procedure: TRANSMETATARSAL AMPUTATION LEFT FOOT;  Surgeon: Newt Minion, MD;  Location: [place];  Service: Orthopedics;  Laterality: Left;   APPENDECTOMY  1941   BIOPSY  05/16/2020   Procedure: BIOPSY;  Surgeon: Lavena Bullion, DO;  Location: [facility];  Service: Gastroenterology;;   CARDIAC CATHETERIZATION  04-24-11/  DR Rogue Jury ARIDA   MILD NONOBSTRUCTIVE CAD, NORMA CARDIAC OUTPUT   CARDIOVASCULAR STRESS TEST  2007   CATARACT EXTRACTION W/ INTRAOCULAR LENS  IMPLANT, BILATERAL Bilateral ~ 2010   CYSTOSCOPY  12/25/2011   Procedure: CYSTOSCOPY;  Surgeon: Molli Hazard, MD;  [facility];  Service: Urology;  Laterality: N/A;  needs intubation and to be paralyzed    CYSTOSCOPY W/ RETROGRADES  10/30/2011   Procedure: CYSTOSCOPY WITH RETROGRADE PYELOGRAM;  Surgeon: Molli Hazard, MD;  Location: [facility];  Service: Urology;  Laterality: Bilateral;  CYSTOSCOPY POSS TURBT BILATERAL RETROGRADE PYLEOGRAM    CYSTOSCOPY W/ RETROGRADES  11/30/2012   Procedure: CYSTOSCOPY WITH RETROGRADE PYELOGRAM;  Surgeon: Molli Hazard, MD;  Location: [facility];  Service: Urology;  Laterality: Bilateral;  Flexible cystoscopy.   CYSTOSCOPY WITH BIOPSY  11/30/2012   Procedure: CYSTOSCOPY WITH BIOPSY;  Surgeon: Molli Hazard, MD;  Location: [facility];  Service: Urology;  Laterality: N/A;   ESOPHAGOGASTRODUODENOSCOPY (EGD) WITH PROPOFOL N/A 05/16/2020   Procedure: ESOPHAGOGASTRODUODENOSCOPY (EGD) WITH PROPOFOL;  Surgeon: Lavena Bullion, DO;  [facility];  Service: Gastroenterology;  Laterality: N/A;   FEMUR IM NAIL Left 08/01/2020    Procedure: INTRAMEDULLARY (IM) RETROGRADE FEMORAL NAILING;  Surgeon: Altamese Towanda, MD;  Location: [place];  Service: Orthopedics;  Laterality: Left;   INCISION AND DRAINAGE FOOT Left ~ 2005   LEFT FOOT DUE TO INFECTION FROM  NAIL PUNCTURE INJURY   LUMBAR LAMINECTOMY/DECOMPRESSION MICRODISCECTOMY N/A 10/18/2016   Procedure: LEFT AND CENTRAL L4-5 LUMBAR LAMINECTOMY WITH RESECTION OF SYNOVIAL CYST;  Surgeon: Jessy Oto, MD;  Location: [place];  Service: Orthopedics;  Laterality: N/A;   ORIF FEMUR FRACTURE Left 04/19/2020   Procedure: OPEN REDUCTION INTERNAL FIXATION FEMORAL SHAFT FRACTURE;  Surgeon: Shona Needles, MD;  Location: [place];  Service: Orthopedics;  Laterality: Left;   PENILE PROSTHESIS IMPLANT  1990s   PERIPHERAL VASCULAR CATHETERIZATION N/A 02/14/2016   Procedure: Abdominal Aortogram w/Lower Extremity;  Surgeon: Wellington Hampshire, MD;  Location: [place] [facility];  Service: Cardiovascular;  Laterality: N/A;   PERIPHERAL VASCULAR CATHETERIZATION Left 02/28/2016   Procedure: Peripheral Vascular Balloon Angioplasty;  Surgeon: Wellington Hampshire, MD;  Location: [place] [facility];  Service: Cardiovascular;  Laterality: Left;  left peroneal and popliteal artery   [place]  TOTAL HIP ARTHROPLASTY Left 12/27/2016   Procedure: LEFT TOTAL HIP ARTHROPLASTY ANTERIOR APPROACH;  Surgeon: Mcarthur Rossetti, MD;  Location: [facility];  Service: Orthopedics;  Laterality: Left;   TRANSURETHRAL RESECTION OF BLADDER TUMOR  10/30/2011   Procedure: TRANSURETHRAL RESECTION OF BLADDER TUMOR (TURBT);  Surgeon: Molli Hazard, MD;  Location: [facility];  Service: Urology;  Laterality: N/A;   TRANSURETHRAL RESECTION OF BLADDER TUMOR  12/25/2011   Procedure: TRANSURETHRAL RESECTION OF BLADDER TUMOR (TURBT);  Surgeon: Molli Hazard, MD;  Location: [facility];  Service: Urology;  Laterality: N/A;  need long gyrus instruments    VASECTOMY  1990s    Social History  reports that he quit smoking about 52 years ago. His smoking use included cigarettes. He has a 44.00 pack-year smoking history. He has never used smokeless tobacco. He reports current alcohol use. He reports that he does not use drugs.  Allergies  Allergen Reactions   Actos [Pioglitazone Hydrochloride] Other (See Comments)    Held 2012 bladder cancer   Aspirin Other (See Comments)    Held 2012 due to hematuria and bruising.    Ace Inhibitors Cough   Bactrim Rash and Other (See Comments)    Rash, presumed allergy   Gabapentin Nausea And Vomiting    Upset stomach and diarrhea - tolerates low doses   Lyrica [Pregabalin] Swelling    swelling   Pravastatin Swelling    Swelling of feet   Sulfa Drugs Cross Reactors Rash    Family History  Problem Relation Age of Onset   Diabetes Mother    Heart disease Brother    Heart disease Brother    Heart disease Brother    Heart disease Brother    Heart disease Brother    Heart disease Brother    Lung cancer Brother   Reviewed on admission  Prior to Admission medications   Medication Sig Start Date End Date Taking? Authorizing Provider  acetaminophen (TYLENOL) 325 MG tablet Take 1-2 tablets (325-650 mg total) by mouth every 4 (four) hours as needed for mild pain. 05/11/20   Angiulli, Lavon Paganini, PA-C  Apremilast 30 MG TABS Take 1 tablet by mouth daily. 03/30/19   Tonia Ghent, MD  atorvastatin (LIPITOR) 10 MG tablet TAKE 1 TABLET (10 MG TOTAL) BY MOUTH DAILY. 08/23/21   Tonia Ghent, MD  cephALEXin (KEFLEX) 500 MG capsule Take 1 capsule (500 mg total) by mouth 3 (three) times daily. Patient not taking: Reported on 07/20/2021 07/10/21   Tonia Ghent, MD  Cholecalciferol (VITAMIN D) 125 MCG (5000 UT) CAPS Take 1 capsule by mouth daily. 08/08/20   Ainsley Spinner, PA-C  clobetasol (TEMOVATE) 0.05 % external solution Apply 1 application topically daily as needed (rash).     [provider]  CRANBERRY PO Take 4 capsules  by mouth daily. Takes AZO 2 tabs BID    [provider]  docusate sodium (COLACE) 100 MG capsule Take 100 mg by mouth daily as needed for mild constipation. Patient not taking: Reported on 07/20/2021    [provider]  doxycycline (ADOXA) 50 MG tablet Take 50 mg by mouth daily.    [provider]  finasteride (PROSCAR) 5 MG tablet Take 5 mg by mouth daily.    [provider]  fluticasone-salmeterol (ADVAIR) 250-50 MCG/ACT AEPB Take 1 puff by mouth 2 (two) times daily as needed (shortness of breath). 01/16/21   [provider]  furosemide (LASIX) 20 MG tablet Take  1 tablet (20 mg total) by mouth daily as needed for edema. 07/20/21   Furth, Cadence H, PA-C  insulin glargine (LANTUS) 100 UNIT/ML Solostar Pen Inject 15 Units into the skin daily. 03/23/21   Tonia Ghent, MD  Multiple Vitamin (MULTIVITAMIN WITH MINERALS) TABS tablet Take 1 tablet by mouth daily. 05/11/20   Angiulli, Lavon Paganini, PA-C  omeprazole (PRILOSEC) 40 MG capsule TAKE 1 CAPSULE BY MOUTH DAILY. 08/15/21   Tonia Ghent, MD  OXYGEN Inhale 2.5 L into the lungs.    [provider]  tamsulosin (FLOMAX) 0.4 MG CAPS capsule TAKE 2 CAPSULES BY MOUTH DAILY AFTER SUPPER. 08/15/21   Tonia Ghent, MD  tiotropium (SPIRIVA) 18 MCG inhalation capsule Place 18 mcg into inhaler and inhale daily.    [provider]  traMADol (ULTRAM) 50 MG tablet Take 1 tablet (50 mg total) by mouth every 8 (eight) hours as needed for moderate pain or severe pain. 08/08/20   Ainsley Spinner, PA-C  traZODone (DESYREL) 50 MG tablet TAKE 1/2 TABLET BY MOUTH AT BEDTIME AS NEEDED FOR SLEEP. 08/22/21   Tonia Ghent, MD  calcium carbonate (OS-CAL - DOSED IN MG OF ELEMENTAL CALCIUM) 1250 (500 Ca) MG tablet Take 1 tablet (500 mg of elemental calcium total) by mouth daily with breakfast. Patient not taking: No sig reported 05/11/20 06/11/21  Angiulli, Lavon Paganini, PA-C  escitalopram (LEXAPRO) 5 MG tablet Take 1 tablet (5 mg  total) by mouth daily. Patient not taking: No sig reported 05/11/20 06/11/21  Cathlyn Parsons, PA-C    Physical Exam: Vitals:   09/15/21 1830 09/15/21 1845 09/15/21 1930 09/15/21 1945  BP: (!) 132/54 (!) 111/48 (!) 144/126 (!) 150/69  Pulse: 85 81 86 90  Resp: (!) 24 (!) 24 (!) 21 (!) 26  Temp:      TempSrc:      SpO2: 95% 93% 92% 94%  Weight:      Height:       Physical Exam Constitutional:      General: He is not in acute distress.    Appearance: Normal appearance.  HENT:     Head: Normocephalic and atraumatic.     Mouth/Throat:     Mouth: Mucous membranes are moist.     Pharynx: Oropharynx is clear.  Eyes:     Extraocular Movements: Extraocular movements intact.     Pupils: Pupils are equal, round, and reactive to light.  Cardiovascular:     Rate and Rhythm: Normal rate and regular rhythm.     Pulses: Normal pulses.     Heart sounds: Normal heart sounds.  Pulmonary:     Effort: Pulmonary effort is normal. No respiratory distress.     Breath sounds: Normal breath sounds.  Abdominal:     General: Bowel sounds are normal. There is no distension.     Palpations: Abdomen is soft.     Tenderness: There is no abdominal tenderness.  Musculoskeletal:        General: No swelling or deformity.  Skin:    General: Skin is warm and dry.  Neurological:     General: No focal deficit present.     Mental Status: He is disoriented.   Labs on Admission: I have personally reviewed following labs and imaging studies  CBC: Recent Labs  Lab 09/15/21 1809  WBC 10.0  NEUTROABS 7.6  HGB 11.3*  HCT 37.6*  MCV 108.0*  PLT 244    Basic Metabolic Panel: Recent Labs  Lab 09/15/21 1809  NA 134*  K 5.8*  CL 90*  CO2 37*  GLUCOSE 412*  BUN 49*  CREATININE 2.14*  CALCIUM 8.4*    GFR: Estimated Creatinine Clearance: 26.7 mL/min (A) (by C-G formula based on SCr of 2.14 mg/dL (H)).  Liver Function Tests: Recent Labs  Lab 09/15/21 1809  AST 12*  ALT 11  ALKPHOS 63   BILITOT 0.6  PROT 6.4*  ALBUMIN 3.0*    Urine analysis:    Component Value Date/Time   COLORURINE AMBER (A) 09/15/2021 1849   APPEARANCEUR CLOUDY (A) 09/15/2021 1849   LABSPEC 1.013 09/15/2021 1849   PHURINE 5.0 09/15/2021 1849   GLUCOSEU >=500 (A) 09/15/2021 1849   GLUCOSEU 250 (A) 06/09/2020 1331   HGBUR LARGE (A) 09/15/2021 1849   HGBUR trace-intact 11/25/2007 0937   BILIRUBINUR NEGATIVE 09/15/2021 1849   BILIRUBINUR neg 06/19/2021 Power 09/15/2021 1849   PROTEINUR 30 (A) 09/15/2021 1849   UROBILINOGEN 0.2 06/19/2021 1520   UROBILINOGEN 0.2 06/09/2020 1331   NITRITE NEGATIVE 09/15/2021 1849   LEUKOCYTESUR LARGE (A) 09/15/2021 1849    Radiological Exams on Admission: DG Chest Port 1 View  Result Date: 09/15/2021 CLINICAL DATA:  Questionable sepsis. EXAM: PORTABLE CHEST 1 VIEW COMPARISON:  June 11, 2021 FINDINGS: Tortuosity and calcific atherosclerotic disease of the aorta. Cardiomediastinal silhouette is normal. Mediastinal contours appear intact. Subtle airspace consolidation versus atelectasis in the left lung base. Osseous structures are without acute abnormality. Soft tissues are grossly normal. IMPRESSION: Subtle airspace consolidation versus atelectasis in the left lung base. Electronically Signed   By: Fidela Salisbury M.D.   On: 09/15/2021 19:10    EKG: Independently reviewed.  Sinus rhythm 86 bpm.  Nonspecific T wave flattening.  Assessment/Plan Principal Problem:   UTI (urinary tract infection) Active Problems:   BPH (benign prostatic hyperplasia)   PSORIASIS, SCALP   CAD (coronary artery disease) of artery bypass graft   Bladder cancer (HCC)   Atrial fibrillation (HCC)   Hyperlipidemia   COPD (chronic obstructive pulmonary disease) (HCC)   Anemia   Acute renal failure superimposed on stage 3b chronic kidney disease (HCC)   Type 2 diabetes mellitus with other specified complication (HCC)   Benign essential HTN   Urinary retention  with incomplete bladder emptying   Gastroesophageal reflux disease with esophagitis without hemorrhage   Encephalopathy  Encephalopathy Urinary tract infection History of bladder cancer, BPH Self-catheterization > Patient with history of recurrent UTIs in setting of history of bladder cancer and self cathing. > Has some baseline confusion but noted to be worsening in the last few days.  Also with worsening weakness and decreased p.o. intake for the last few days.  Presentation similar to previous UTIs. > Family noted cath urine had increased odor and was more cloudy recently as well as blood today. > No leukocytosis in the ED.  However urinalysis did show glucose, hemoglobin, leukocytes, bacteria.  Patient started ceftriaxone in the ED. > Also history of chronic cystitis on daily doxycycline  - Continue with ceftriaxone - Follow-up urine culture - Trend CBC and fever curve -Continue home finasteride and tamsulosin  AKI Hyperkalemia > In setting of decreased p.o. intake in the last few days as above. > Received dose of Kayexalate in the ED. > Started on IV fluids in the ED - Cardiac monitoring - Continue with IV fluids - Avoid nephrotoxic agents - Trend renal function electrolytes  Diabetes Hyperglycemia > Glucose elevated in the ED.  In the setting of UTI as  above. > On 15 units nightly at home. - We will start with 12 units nightly and SSI   Macrocytic anemia > Hemoglobin noted to be 11.3 in the ED down from baseline around 14 with MCV of 108. > Family did also note\ some blood in urine today. - Check B12 and folate - Trend CBC  COPD > On chronic 2.5 L home O2 -Continue home Spiriva -Replace home Advair with formulary Dulera  CHF > Last echo in August with EF 55-60% and G2 DD. - Holding Lasix considering AKI and decreased p.o. as above  History atrial fibrillation - Sinus rhythm in ED, no medications for this  Hyperlipidemia CAD PAD > History of CABG -  Continue home atorvastatin  GERD - Continue home PPI  Psoriasis - Continue home apremilast   Palliative care patient > [facility] > Follows valve care outpatient.  Have been working to get him on hospice however does not yet qualify. - Continue tramadol for chronic pain  DVT prophylaxis: Lovenox Code Status:   DNR  Family Communication:  Spouse updated at bedside Disposition Plan:   Patient is from:  Home  Anticipated DC to:  Pending clinical course  Anticipated DC date:  Pending clinical course  Anticipated DC barriers: None  Consults called:  None Admission status:  Observation, telemetry Severity of Illness: The appropriate patient status for this patient is OBSERVATION. Observation status is judged to be reasonable and necessary in order to provide the required intensity of service to ensure the patient's safety. The patient's presenting symptoms, physical exam findings, and initial radiographic and laboratory data in the context of their medical condition is felt to place them at decreased risk for further clinical deterioration. Furthermore, it is anticipated that the patient will be medically stable for discharge from the hospital within 2 midnights of admission.    Marcelyn Bruins MD [facility]  How to contact the [facility] Attending or Consulting provider [place] or covering provider during after hours [place], for this patient?   Check the care team in [facility] and look for a) attending/consulting [facility] provider listed and b) the [facility] team listed Log into [email or web address] and use [facility]'s universal password to access. If you do not have the password, please contact the hospital operator. Locate the [facility] provider you are looking for under [facility] and page to a number that you can be directly reached. If you still have difficulty reaching the provider, please page the [facility] (Director on Call) for the Hospitalists listed on amion for assistance.  09/15/2021, 9:00 PM

## 2021-09-15 NOTE — ED Triage Notes (Signed)
BIB [facility] from home. C/o altered mental status, weakness and poor po intake x3-4 days. Per family pt takes doxycycline every day. Hx: in and out caths, and frequent UTIs. Family states this is how is was acting last time he had a UTI. Pt is confused at baseline.   160/84 88 heart rate 86% 2 L  (wears this at home) 100% NRB CBG 503  230ml NS

## 2021-09-16 DIAGNOSIS — E875 Hyperkalemia: Secondary | ICD-10-CM | POA: Diagnosis present

## 2021-09-16 DIAGNOSIS — E1122 Type 2 diabetes mellitus with diabetic chronic kidney disease: Secondary | ICD-10-CM | POA: Diagnosis present

## 2021-09-16 DIAGNOSIS — N3001 Acute cystitis with hematuria: Secondary | ICD-10-CM | POA: Diagnosis present

## 2021-09-16 DIAGNOSIS — E785 Hyperlipidemia, unspecified: Secondary | ICD-10-CM | POA: Diagnosis present

## 2021-09-16 DIAGNOSIS — Z20822 Contact with and (suspected) exposure to covid-19: Secondary | ICD-10-CM | POA: Diagnosis present

## 2021-09-16 DIAGNOSIS — Z66 Do not resuscitate: Secondary | ICD-10-CM | POA: Diagnosis present

## 2021-09-16 DIAGNOSIS — N179 Acute kidney failure, unspecified: Secondary | ICD-10-CM | POA: Diagnosis present

## 2021-09-16 DIAGNOSIS — Z833 Family history of diabetes mellitus: Secondary | ICD-10-CM | POA: Diagnosis not present

## 2021-09-16 DIAGNOSIS — E1165 Type 2 diabetes mellitus with hyperglycemia: Secondary | ICD-10-CM | POA: Diagnosis present

## 2021-09-16 DIAGNOSIS — Z515 Encounter for palliative care: Secondary | ICD-10-CM | POA: Diagnosis not present

## 2021-09-16 DIAGNOSIS — I4891 Unspecified atrial fibrillation: Secondary | ICD-10-CM | POA: Diagnosis not present

## 2021-09-16 DIAGNOSIS — A419 Sepsis, unspecified organism: Secondary | ICD-10-CM | POA: Diagnosis present

## 2021-09-16 DIAGNOSIS — G9341 Metabolic encephalopathy: Secondary | ICD-10-CM | POA: Diagnosis present

## 2021-09-16 DIAGNOSIS — Z801 Family history of malignant neoplasm of trachea, bronchus and lung: Secondary | ICD-10-CM | POA: Diagnosis not present

## 2021-09-16 DIAGNOSIS — E11649 Type 2 diabetes mellitus with hypoglycemia without coma: Secondary | ICD-10-CM | POA: Diagnosis not present

## 2021-09-16 DIAGNOSIS — D649 Anemia, unspecified: Secondary | ICD-10-CM | POA: Diagnosis not present

## 2021-09-16 DIAGNOSIS — I130 Hypertensive heart and chronic kidney disease with heart failure and stage 1 through stage 4 chronic kidney disease, or unspecified chronic kidney disease: Secondary | ICD-10-CM | POA: Diagnosis present

## 2021-09-16 DIAGNOSIS — N1832 Chronic kidney disease, stage 3b: Secondary | ICD-10-CM | POA: Diagnosis present

## 2021-09-16 DIAGNOSIS — J9601 Acute respiratory failure with hypoxia: Secondary | ICD-10-CM | POA: Diagnosis present

## 2021-09-16 DIAGNOSIS — Z9981 Dependence on supplemental oxygen: Secondary | ICD-10-CM | POA: Diagnosis not present

## 2021-09-16 DIAGNOSIS — Z8249 Family history of ischemic heart disease and other diseases of the circulatory system: Secondary | ICD-10-CM | POA: Diagnosis not present

## 2021-09-16 DIAGNOSIS — E1151 Type 2 diabetes mellitus with diabetic peripheral angiopathy without gangrene: Secondary | ICD-10-CM | POA: Diagnosis present

## 2021-09-16 DIAGNOSIS — C679 Malignant neoplasm of bladder, unspecified: Secondary | ICD-10-CM | POA: Diagnosis present

## 2021-09-16 DIAGNOSIS — I5032 Chronic diastolic (congestive) heart failure: Secondary | ICD-10-CM | POA: Diagnosis present

## 2021-09-16 DIAGNOSIS — E1169 Type 2 diabetes mellitus with other specified complication: Secondary | ICD-10-CM | POA: Diagnosis present

## 2021-09-16 DIAGNOSIS — J449 Chronic obstructive pulmonary disease, unspecified: Secondary | ICD-10-CM | POA: Diagnosis present

## 2021-09-16 DIAGNOSIS — D630 Anemia in neoplastic disease: Secondary | ICD-10-CM | POA: Diagnosis present

## 2021-09-16 LAB — COMPREHENSIVE METABOLIC PANEL
ALT: 11 U/L (ref 0–44)
AST: 16 U/L (ref 15–41)
Albumin: 2.9 g/dL — ABNORMAL LOW (ref 3.5–5.0)
Alkaline Phosphatase: 54 U/L (ref 38–126)
Anion gap: 11 (ref 5–15)
BUN: 48 mg/dL — ABNORMAL HIGH (ref 8–23)
CO2: 31 mmol/L (ref 22–32)
Calcium: 8.4 mg/dL — ABNORMAL LOW (ref 8.9–10.3)
Chloride: 96 mmol/L — ABNORMAL LOW (ref 98–111)
Creatinine, Ser: 2 mg/dL — ABNORMAL HIGH (ref 0.61–1.24)
GFR, Estimated: 31 mL/min — ABNORMAL LOW (ref >60)
Glucose, Bld: 88 mg/dL (ref 70–99)
Potassium: 5 mmol/L (ref 3.5–5.1)
Sodium: 138 mmol/L (ref 135–145)
Total Bilirubin: 0.5 mg/dL (ref 0.3–1.2)
Total Protein: 6.1 g/dL — ABNORMAL LOW (ref 6.5–8.1)

## 2021-09-16 LAB — FOLATE: Folate: 30.8 ng/mL (ref >5.9)

## 2021-09-16 LAB — CBC
HCT: 37.7 % — ABNORMAL LOW (ref 39.0–52.0)
Hemoglobin: 11.5 g/dL — ABNORMAL LOW (ref 13.0–17.0)
MCH: 32.2 pg (ref 26.0–34.0)
MCHC: 30.5 g/dL (ref 30.0–36.0)
MCV: 105.6 fL — ABNORMAL HIGH (ref 80.0–100.0)
Platelets: 209 10*3/uL (ref 150–400)
RBC: 3.57 MIL/uL — ABNORMAL LOW (ref 4.22–5.81)
RDW: 13.2 % (ref 11.5–15.5)
WBC: 15.1 10*3/uL — ABNORMAL HIGH (ref 4.0–10.5)
nRBC: 0 % (ref 0.0–0.2)

## 2021-09-16 LAB — VITAMIN B12: Vitamin B-12: 631 pg/mL (ref 180–914)

## 2021-09-16 LAB — GLUCOSE, CAPILLARY
Glucose-Capillary: 82 mg/dL (ref 70–99)
Glucose-Capillary: 81 mg/dL (ref 70–99)

## 2021-09-16 MED ORDER — TRAMADOL HCL 50 MG PO TABS: 50.0000 mg | ORAL_TABLET | Freq: Three times a day (TID) | ORAL | Status: DC | PRN

## 2021-09-16 MED ORDER — HALOPERIDOL LACTATE 5 MG/ML IJ SOLN: 1.0000 mg | Freq: Four times a day (QID) | INTRAMUSCULAR | Status: DC | PRN

## 2021-09-16 MED ORDER — PANTOPRAZOLE SODIUM 40 MG IV SOLR
40.0000 mg | Freq: Every day | INTRAVENOUS | Status: DC
  Administered 2021-09-16: 40 mg via INTRAVENOUS
  Filled 2021-09-16: qty 40

## 2021-09-16 MED ORDER — HYDROMORPHONE HCL 1 MG/ML IJ SOLN
0.5000 mg | INTRAMUSCULAR | Status: DC | PRN
  Administered 2021-09-16 – 2021-09-18 (×8): 0.5 mg via INTRAVENOUS
  Filled 2021-09-16 (×9): qty 0.5

## 2021-09-16 MED ORDER — SODIUM CHLORIDE 0.9 % IV SOLN: INTRAVENOUS | Status: DC

## 2021-09-16 MED ORDER — HALOPERIDOL 1 MG PO TABS
1.0000 mg | ORAL_TABLET | Freq: Four times a day (QID) | ORAL | Status: DC | PRN
  Filled 2021-09-16: qty 1

## 2021-09-16 MED ORDER — ONDANSETRON HCL 4 MG/2ML IJ SOLN: 4.0000 mg | Freq: Four times a day (QID) | INTRAMUSCULAR | Status: DC | PRN

## 2021-09-16 NOTE — Plan of Care (Signed)
  Problem: Pain Managment: Goal: General experience of comfort will improve Outcome: Progressing   

## 2021-09-16 NOTE — Progress Notes (Signed)
[facility] night cross cover note:  I was contacted by patient's RN who conveyed to me the report from patient's wife of patient's history of chronic urinary retention for which he performs serial every 6 hour self caths at home.  I subsequently placed an order for q 6 hour bladder scans with prn straight caths for PVR greater than 400 cc of urine.      Babs Bertin, DO Hospitalist

## 2021-09-16 NOTE — Progress Notes (Signed)
[facility])  [name] Small is our current outpatient palliative patient. Plans for admission to hospice were in place to admit him Sunday, however he came to the hospital prior to this occurring.  [facility] will continue to follow and anticipate setting hospice up prior to his discharge back home.  Thank you, Venia Carbon RN, BSN [facility] Liaison

## 2021-09-16 NOTE — Progress Notes (Addendum)
PROGRESS NOTE    [name] Small  FWY:[id number] DOB: 05/17/1928 DOA: 09/15/2021 PCP: Tonia Ghent, MD    Brief Narrative:  Mr. [name] Small was admitted to the hospital with the working diagnosis of metabolic encephalopathy due to urinary tract infection.   87 year old male past medical history for heart failure, anemia, atrial fibrillation, hypertension, dyslipidemia, coronary artery disease, peripheral vascular disease, GERD, spinal stenosis, type 2 diabetes mellitus, bladder cancer, chronic urinary retention with self-catheterization who presented with altered mental status.  Patient was unable to give detailed history on admission, most information from his family, apparently patient has significantly decreased p.o. intake, generalized weakness and progressive confusion.  His urine was noted to be cloudy and bloody. Patient is currently outpatient palliative care, plans to admission to hospice after his hospitalization. On his initial physical examination blood pressure 132/54, heart rate 85, respirate 24, oxygen saturation 93%, his lungs were clear to auscultation bilaterally, heart S1-S2, present, rhythmic, soft abdomen, lower extremity edema.  Patient was disoriented.  Sodium 134, potassium 5.8, chloride 90, bicarb 37, glucose 412, BUN 49, creatinine 2.14, white count 10.0, hemoglobin 11.3, hematocrit 37.6, platelets 205. SARS COVID-19 negative.  Urinalysis, specific gravity 1.013, > 50 white cells, 60-10 red cells.  Chest radiograph with right rotation, no infiltrates.  EKG 86 bpm, normal axis, normal intervals, sinus rhythm with PACs, no significant ST segment or T wave changes.  Patient has been placed on antibiotic therapy and IV fluids.   Assessment & Plan:   Principal Problem:   UTI (urinary tract infection) Active Problems:   BPH (benign prostatic hyperplasia)   PSORIASIS, SCALP   CAD (coronary artery disease) of artery bypass graft   Bladder cancer (HCC)   Atrial  fibrillation (HCC)   Hyperlipidemia   COPD (chronic obstructive pulmonary disease) (HCC)   Anemia   Acute renal failure superimposed on stage 3b chronic kidney disease (HCC)   Type 2 diabetes mellitus with other specified complication (HCC)   Benign essential HTN   Urinary retention with incomplete bladder emptying   Gastroesophageal reflux disease with esophagitis without hemorrhage   Encephalopathy   AKI (acute kidney injury) ([place])   Urinary tract infection complicated with severe sepsis, end-organ failure AKI. Hypoxemic respiratory failure and metabolic encephalopathy.  Bladder cancer with chronic urinary retention.  Patient continue to be somnolent, he has been in pain related to his bladder cancer.  Blood pressure 277 mmHg systolic and HR 57 to 90, oxygenation 66% on room air. Positive renal failure. Patient with multiorgan failure and poor prognosis.  Plan to continue IV fluids with isotonic saline and dextrose at 100 ml per Hr. Continue antibiotic therapy with ceftriaxone (old records personally reviewed urine culture positive for E coli sensitive to cephalosporins).  Continue to monitor cell count, cultures and temperature curve. Patient is DNR.  Continue NPO with aspiration precautions.  Supplemental 02 per [place] to keep 02 saturation 92% or greater.  As needed haloperidol for agitation.  PPI for GI prophylaxis  2. T2DM with hyper and hypoglycemia/ dyslipidemia. Patient hyperglycemic on admission, but today glucose has been low down to 82 and 81. He had 12 units of basal insulin last night.  Will continue capillary glucose monitoring but will hold on insulin. Patient is not having PO intake due to encephalopathy.   Hold on statin, patient not able to take po medications.   3. COPD no signs of exacerbation, continue oxymetry monitoring   4. CKD stage 3b, hyperkalemia renal function with serum cr at 2,0,  K is 5,0 and serum Na is 138, Bicarbonate is 31.  Plan to continue  volume repletion with isotonic saline at 100 ml per Hr  5. Diastolic heart failure  No signs of exacerbation, continue blood pressure monitoring.   6. Paroxysmal atrial fibrillation rate is controlled. Patient not able to take po medications due to encephalopathy, holding anticoagulation   7. Chronic anemia, malignancy related/ bladder cancer.   Hgb is 11,5 and hct at 37,7 No signs of active bleeding Continue pain control with as needed hydromorphone.    Patient continue to be at high risk for worsening sepsis .  Patient with multiorgan failure, poor prognosis, life expectance less than 2 weeks, will consult social work for residential hospice.   Status is: Inpatient  Remains inpatient appropriate because: IV antibiotics  DVT prophylaxis: Svd   Code Status:    DNR   Family Communication:  I spoke with patient's wife and daughter at the bedside, we talked in detail about patient's condition, plan of care and prognosis and all questions were addressed.  Antimicrobials:  Ceftriaxone     Subjective: Patient very weak and deconditioned, only opens eyes to touch, he is not verbal and not following commands. Not able to eat.   Objective: Vitals:   09/15/21 2130 09/15/21 2138 09/16/21 0439 09/16/21 0844  BP:   (!) 117/98 117/76  Pulse:   91 (!) 57  Resp:   17 19  Temp:   (!) 97.4 F (36.3 C) (!) 97.5 F (36.4 C)  TempSrc:   Oral Oral  SpO2: 91% 96% 95% (!) 66%  Weight:      Height:        Intake/Output Summary (Last 24 hours) at 09/16/2021 1441 Last data filed at 09/16/2021 0430 Gross per 24 hour  Intake --  Output 950 ml  Net -950 ml   Filed Weights   09/15/21 1751  Weight: 93 kg    Examination:   General: deconditioned and ill looking appearing  Neurology: lethargic and poorly responsive.  E ENT: positive pallor, no icterus, oral mucosa dry.  Cardiovascular: No JVD. S1-S2 present, rhythmic, no gallops, rubs, or murmurs. No lower extremity edema. Pulmonary:  positive breath sounds bilaterally, no wheezing, rhonchi or rales. Gastrointestinal. Abdomen soft and non tender, distended Skin. No rashes Musculoskeletal: no joint deformities     Data Reviewed: I have personally reviewed following labs and imaging studies  CBC: Recent Labs  Lab 09/15/21 1809 09/16/21 0720  WBC 10.0 15.1*  NEUTROABS 7.6  --   HGB 11.3* 11.5*  HCT 37.6* 37.7*  MCV 108.0* 105.6*  PLT 205 073   Basic Metabolic Panel: Recent Labs  Lab 09/15/21 1809 09/16/21 0720  NA 134* 138  K 5.8* 5.0  CL 90* 96*  CO2 37* 31  GLUCOSE 412* 88  BUN 49* 48*  CREATININE 2.14* 2.00*  CALCIUM 8.4* 8.4*   GFR: Estimated Creatinine Clearance: 28.5 mL/min (A) (by C-G formula based on SCr of 2 mg/dL (H)). Liver Function Tests: Recent Labs  Lab 09/15/21 1809 09/16/21 0720  AST 12* 16  ALT 11 11  ALKPHOS 63 54  BILITOT 0.6 0.5  PROT 6.4* 6.1*  ALBUMIN 3.0* 2.9*   No results for input(s): LIPASE, AMYLASE in the last 168 hours. No results for input(s): AMMONIA in the last 168 hours. Coagulation Profile: Recent Labs  Lab 09/15/21 1809  INR 1.1   Cardiac Enzymes: No results for input(s): CKTOTAL, CKMB, CKMBINDEX, TROPONINI in the last 168 hours. BNP (  last 3 results) No results for input(s): PROBNP in the last 8760 hours. HbA1C: No results for input(s): HGBA1C in the last 72 hours. CBG: Recent Labs  Lab 09/15/21 1752 09/15/21 2349 09/16/21 1020 09/16/21 1415  GLUCAP 404* 272* 82 81   Lipid Profile: No results for input(s): CHOL, HDL, LDLCALC, TRIG, CHOLHDL, LDLDIRECT in the last 72 hours. Thyroid Function Tests: No results for input(s): TSH, T4TOTAL, FREET4, T3FREE, THYROIDAB in the last 72 hours. Anemia Panel: Recent Labs    09/16/21 0720  VITAMINB12 631  FOLATE 30.8      Radiology Studies: I have reviewed all of the imaging during this hospital visit personally     Scheduled Meds:  Apremilast  1 tablet Oral Daily   atorvastatin  10 mg  Oral Daily   enoxaparin (LOVENOX) injection  30 mg Subcutaneous Q24H   finasteride  5 mg Oral Daily   insulin aspart  0-5 Units Subcutaneous QHS   insulin aspart  0-9 Units Subcutaneous TID WC   insulin glargine-yfgn  12 Units Subcutaneous QHS   mometasone-formoterol  2 puff Inhalation BID   pantoprazole (PROTONIX) IV  40 mg Intravenous Daily   sodium chloride flush  3 mL Intravenous Q12H   tamsulosin  0.8 mg Oral QPC supper   umeclidinium bromide  1 puff Inhalation Daily   Continuous Infusions:  sodium chloride 125 mL/hr at 09/16/21 0603   cefTRIAXone (ROCEPHIN)  IV Stopped (09/15/21 2101)     LOS: 0 days        Rafaelita Foister Gerome Apley, MD

## 2021-09-16 NOTE — Telephone Encounter (Signed)
Late entry.  I talked with patient's wife on 09/14/2021 at the end of clinic, around 530 PM.  Patient has been drinking some fluid but not taking his typical solid p.o. intake.  He has been sleeping more.  We talked about options.  She was going to try to get hospice to evaluate the patient over the weekend.  She was monitoring him in the meantime.  If his condition worsen she could always call us/the triage line over the weekend.  I will await update from her on hospice in the meantime.  I thanked her for her effort and she agreed to the plan.

## 2021-09-17 ENCOUNTER — Telehealth: Payer: Self-pay | Admitting: Family Medicine

## 2021-09-17 DIAGNOSIS — I4891 Unspecified atrial fibrillation: Secondary | ICD-10-CM | POA: Diagnosis not present

## 2021-09-17 DIAGNOSIS — N3001 Acute cystitis with hematuria: Secondary | ICD-10-CM | POA: Diagnosis not present

## 2021-09-17 DIAGNOSIS — D649 Anemia, unspecified: Secondary | ICD-10-CM | POA: Diagnosis not present

## 2021-09-17 DIAGNOSIS — N179 Acute kidney failure, unspecified: Secondary | ICD-10-CM | POA: Diagnosis not present

## 2021-09-17 LAB — URINE CULTURE: Culture: 4000 — AB

## 2021-09-17 LAB — CBC
HCT: 34 % — ABNORMAL LOW (ref 39.0–52.0)
Hemoglobin: 10.2 g/dL — ABNORMAL LOW (ref 13.0–17.0)
MCH: 32.2 pg (ref 26.0–34.0)
MCHC: 30 g/dL (ref 30.0–36.0)
MCV: 107.3 fL — ABNORMAL HIGH (ref 80.0–100.0)
Platelets: 217 10*3/uL (ref 150–400)
RBC: 3.17 MIL/uL — ABNORMAL LOW (ref 4.22–5.81)
RDW: 14 % (ref 11.5–15.5)
WBC: 11.6 10*3/uL — ABNORMAL HIGH (ref 4.0–10.5)
nRBC: 0 % (ref 0.0–0.2)

## 2021-09-17 LAB — BASIC METABOLIC PANEL
Anion gap: 7 (ref 5–15)
BUN: 53 mg/dL — ABNORMAL HIGH (ref 8–23)
CO2: 36 mmol/L — ABNORMAL HIGH (ref 22–32)
Calcium: 8.1 mg/dL — ABNORMAL LOW (ref 8.9–10.3)
Chloride: 96 mmol/L — ABNORMAL LOW (ref 98–111)
Creatinine, Ser: 2.03 mg/dL — ABNORMAL HIGH (ref 0.61–1.24)
GFR, Estimated: 31 mL/min — ABNORMAL LOW (ref >60)
Glucose, Bld: 48 mg/dL — ABNORMAL LOW (ref 70–99)
Potassium: 4.7 mmol/L (ref 3.5–5.1)
Sodium: 139 mmol/L (ref 135–145)

## 2021-09-17 LAB — GLUCOSE, CAPILLARY
Glucose-Capillary: 27 mg/dL — CL (ref 70–99)
Glucose-Capillary: 119 mg/dL — ABNORMAL HIGH (ref 70–99)
Glucose-Capillary: 88 mg/dL (ref 70–99)
Glucose-Capillary: 109 mg/dL — ABNORMAL HIGH (ref 70–99)

## 2021-09-17 MED ORDER — GLYCOPYRROLATE 1 MG PO TABS
1.0000 mg | ORAL_TABLET | ORAL | Status: DC | PRN
  Filled 2021-09-17: qty 1

## 2021-09-17 MED ORDER — GLYCOPYRROLATE 0.2 MG/ML IJ SOLN: 0.2000 mg | INTRAMUSCULAR | Status: DC | PRN

## 2021-09-17 MED ORDER — HALOPERIDOL LACTATE 2 MG/ML PO CONC: 0.5000 mg | ORAL | Status: DC | PRN

## 2021-09-17 MED ORDER — ACETAMINOPHEN 325 MG PO TABS: 650.0000 mg | ORAL_TABLET | Freq: Four times a day (QID) | ORAL | Status: DC | PRN

## 2021-09-17 MED ORDER — ONDANSETRON HCL 4 MG/2ML IJ SOLN: 4.0000 mg | Freq: Four times a day (QID) | INTRAMUSCULAR | Status: DC | PRN

## 2021-09-17 MED ORDER — POLYVINYL ALCOHOL 1.4 % OP SOLN
1.0000 [drp] | Freq: Four times a day (QID) | OPHTHALMIC | Status: DC | PRN
  Filled 2021-09-17: qty 15

## 2021-09-17 MED ORDER — ONDANSETRON 4 MG PO TBDP: 4.0000 mg | ORAL_TABLET | Freq: Four times a day (QID) | ORAL | Status: DC | PRN

## 2021-09-17 MED ORDER — HALOPERIDOL 0.5 MG PO TABS
0.5000 mg | ORAL_TABLET | ORAL | Status: DC | PRN
  Filled 2021-09-17: qty 1

## 2021-09-17 MED ORDER — DEXTROSE 50 % IV SOLN
INTRAVENOUS | Status: AC
  Filled 2021-09-17: qty 50

## 2021-09-17 MED ORDER — ACETAMINOPHEN 650 MG RE SUPP: 650.0000 mg | Freq: Four times a day (QID) | RECTAL | Status: DC | PRN

## 2021-09-17 MED ORDER — BIOTENE DRY MOUTH MT LIQD: 15.0000 mL | OROMUCOSAL | Status: DC | PRN

## 2021-09-17 MED ORDER — DEXTROSE-NACL 5-0.9 % IV SOLN
INTRAVENOUS | Status: DC
Start: 2021-09-17 — End: 2021-09-17

## 2021-09-17 MED ORDER — HALOPERIDOL LACTATE 5 MG/ML IJ SOLN: 0.5000 mg | INTRAMUSCULAR | Status: DC | PRN

## 2021-09-17 NOTE — Progress Notes (Signed)
While reviewing am labs pt BMP showed pt's BG was 48. Pt has NPO at this time, Accucheck was obtained and BG was 27. D50 administered, MD notified, NS dc'd and pt started on D5/NS infusing at 75/hr. Pt was asymptomatic during this hypoglycemic event. Last BG 119 at this time. Pt resting with eyes closed, call bell in reach, no signs of distress, family at bedside

## 2021-09-17 NOTE — Telephone Encounter (Addendum)
Per chart review tab pt was admitted to [facility] on 09/15/21. Sending note to Dr Damita Dunnings and Janett Billow CMA.

## 2021-09-17 NOTE — Progress Notes (Addendum)
Inpatient Diabetes Program Recommendations  AACE/ADA: New Consensus Statement on Inpatient Glycemic Control (2015)  Target Ranges:  Prepandial:   less than 140 mg/dL      Peak postprandial:   less than 180 mg/dL (1-2 hours)      Critically ill patients:  140 - 180 mg/dL   Lab Results  Component Value Date   GLUCAP 119 (H) 09/17/2021   HGBA1C 8.2 (H) 06/12/2021    Review of Glycemic Control Results for [facility], BELLUOMINI (MRN [id number]) as of 09/17/2021 09:40  Ref. Range 09/16/2021 10:20 09/16/2021 14:15 09/17/2021 03:47 09/17/2021 04:46  Glucose-Capillary Latest Ref Range: 70 - 99 mg/dL 82 81 27 (LL) 119 (H)   Diabetes history: Type 2 DM Outpatient Diabetes medications: Semglee 15 units QD (not taking) Current orders for Inpatient glycemic control: none  Inpatient Diabetes Program Recommendations:    Noted hypoglycemia this of 27 mg/dL following Semglee 12 units x1, since has been discontinued.  Consider changing cbg order to Q4H, If CBGs exceed 200 mg/dL, consider adding Novolog 0-6 units Q4H.  Thanks, Bronson Curb, MSN, RNC-OB Diabetes Coordinator [phone] (8a-5p)

## 2021-09-17 NOTE — Progress Notes (Signed)
PROGRESS NOTE    [name] Small  GDJ:[id number] DOB: 03/17/1928 DOA: 09/15/2021 PCP: Tonia Ghent, MD    Brief Narrative:  [name] Small was admitted to the hospital with the working diagnosis of metabolic encephalopathy due to urinary tract infection.    87 year old male past medical history for heart failure, anemia, atrial fibrillation, hypertension, dyslipidemia, coronary artery disease, peripheral vascular disease, GERD, spinal stenosis, type 2 diabetes mellitus, bladder cancer, chronic urinary retention with self-catheterization who presented with altered mental status.  Patient was unable to give detailed history on admission, most information from his family, apparently patient has significantly decreased p.o. intake, generalized weakness and progressive confusion.  His urine was noted to be cloudy and bloody. Patient is currently outpatient palliative care, plans to admission to hospice after his hospitalization. On his initial physical examination blood pressure 132/54, heart rate 85, respirate 24, oxygen saturation 93%, his lungs were clear to auscultation bilaterally, heart S1-S2, present, rhythmic, soft abdomen, lower extremity edema.  Patient was disoriented.   Sodium 134, potassium 5.8, chloride 90, bicarb 37, glucose 412, BUN 49, creatinine 2.14, white count 10.0, hemoglobin 11.3, hematocrit 37.6, platelets 205. SARS COVID-19 negative.   Urinalysis, specific gravity 1.013, > 50 white cells, 60-10 red cells.   Chest radiograph with right rotation, no infiltrates.   EKG 86 bpm, normal axis, normal intervals, sinus rhythm with PACs, no significant ST segment or T wave changes.   Patient has been placed on antibiotic therapy and IV fluids.    Patient with poor prognosis will transition to full comfort care, pending transfer to residential hospice.    Assessment & Plan:   Principal Problem:   UTI (urinary tract infection) Active Problems:   BPH (benign prostatic  hyperplasia)   PSORIASIS, SCALP   CAD (coronary artery disease) of artery bypass graft   Bladder cancer (HCC)   Atrial fibrillation (HCC)   Hyperlipidemia   COPD (chronic obstructive pulmonary disease) (HCC)   Anemia   Acute renal failure superimposed on stage 3b chronic kidney disease (HCC)   Type 2 diabetes mellitus with other specified complication (HCC)   Benign essential HTN   Urinary retention with incomplete bladder emptying   Gastroesophageal reflux disease with esophagitis without hemorrhage   Encephalopathy   AKI (acute kidney injury) ([place])       Urinary tract infection complicated with severe sepsis, end-organ failure AKI. Hypoxemic respiratory failure and metabolic encephalopathy.  Bladder cancer with chronic urinary retention.  Patient more awake today, opens yes to voice and touch.  Continue to have a very poor prognosis.  Wbc is 11.6 from 15.1, he has been afebrile, he has received 2 doses of IV ceftriaxone with goo toleration.   Hospice has been consulted and patient will be transitioned to full comfort care. Plan to transfer to residential hospice when bed available. Discontinue antibiotic therapy and IV fluids, allow po intake with dysphagia 2 diet with aspiration precautions. No further blood work at this point needed.    2. T2DM with hyper and hypoglycemia/ dyslipidemia. Hypoglycemia with fasting glucose 48, capillary 27, 119 and 88. Will allow patient to eat with aspiration precautions. Check CBG only as needed.    3. COPD no clinical signs of exacerbation  Patient on room air oxygenating well.  Oxymetry 66% likely error measurement.   4. CKD stage 3b, hyperkalemia  Follow up renal function with serum cr at 2,0 with K at 4,7 and serum bicarbonate at 36. No further blood work at this point.  5. Diastolic heart failure  No clinical signs of exacerbation   6. Paroxysmal atrial fibrillation  Rate controlled, continue to hold on anticoagulation due to  risk of fall and bleeding.    7. Chronic anemia, malignancy related/ bladder cancer.   Stable hgb 10,2 with Hct at 34,0    Status is: Inpatient  Remains inpatient appropriate because: Pending transfer to residential hospice.   DVT prophylaxis: Scd   Code Status:    DNR   Family Communication:  I spoke with patient's wife at the bedside, we talked in detail about patient's condition, plan of care and prognosis and all questions were addressed.     Subjective: Patient with no nausea or vomiting, he is more awake than yesterday. Most information from his wife at the bedside   Objective: Vitals:   09/16/21 0844 09/16/21 1741 09/17/21 0449 09/17/21 0733  BP: 117/76 120/60 (!) 141/67 127/62  Pulse: (!) 57 91 77 75  Resp: 19 18 16 16   Temp: (!) 97.5 F (36.4 C) 98.5 F (36.9 C) 97.6 F (36.4 C) 98.4 F (36.9 C)  TempSrc: Oral Oral Oral Oral  SpO2: (!) 66% 92% 96% 94%  Weight:   98.9 kg   Height:        Intake/Output Summary (Last 24 hours) at 09/17/2021 1404 Last data filed at 09/17/2021 0500 Gross per 24 hour  Intake 956.87 ml  Output 1300 ml  Net -343.13 ml   Filed Weights   09/15/21 1751 09/17/21 0449  Weight: 93 kg 98.9 kg    Examination:   General: deconditioned and ill looking appearing  Neurology: Somnolent but easy to arouse, opens eyes but not follows commands or responds to simple questions  E ENT: positive pallor, no icterus, oral mucosa moist Cardiovascular: No JVD. S1-S2 present, rhythmic, no gallops, rubs, or murmurs. No lower extremity edema. Pulmonary: positive breath sounds bilaterally, adequate air movement, no wheezing, rhonchi or rales. Gastrointestinal. Abdomen soft and non tender Skin. No rashes Musculoskeletal: no joint deformities     Data Reviewed: I have personally reviewed following labs and imaging studies  CBC: Recent Labs  Lab 09/15/21 1809 09/16/21 0720 09/17/21 0041  WBC 10.0 15.1* 11.6*  NEUTROABS 7.6  --   --   HGB 11.3*  11.5* 10.2*  HCT 37.6* 37.7* 34.0*  MCV 108.0* 105.6* 107.3*  PLT 205 209 749   Basic Metabolic Panel: Recent Labs  Lab 09/15/21 1809 09/16/21 0720 09/17/21 0041  NA 134* 138 139  K 5.8* 5.0 4.7  CL 90* 96* 96*  CO2 37* 31 36*  GLUCOSE 412* 88 48*  BUN 49* 48* 53*  CREATININE 2.14* 2.00* 2.03*  CALCIUM 8.4* 8.4* 8.1*   GFR: Estimated Creatinine Clearance: 30.4 mL/min (A) (by C-G formula based on SCr of 2.03 mg/dL (H)). Liver Function Tests: Recent Labs  Lab 09/15/21 1809 09/16/21 0720  AST 12* 16  ALT 11 11  ALKPHOS 63 54  BILITOT 0.6 0.5  PROT 6.4* 6.1*  ALBUMIN 3.0* 2.9*   No results for input(s): LIPASE, AMYLASE in the last 168 hours. No results for input(s): AMMONIA in the last 168 hours. Coagulation Profile: Recent Labs  Lab 09/15/21 1809  INR 1.1   Cardiac Enzymes: No results for input(s): CKTOTAL, CKMB, CKMBINDEX, TROPONINI in the last 168 hours. BNP (last 3 results) No results for input(s): PROBNP in the last 8760 hours. HbA1C: No results for input(s): HGBA1C in the last 72 hours. CBG: Recent Labs  Lab 09/16/21 1020 09/16/21 1415  09/17/21 0347 09/17/21 0446 09/17/21 1247  GLUCAP 82 81 27* 119* 88   Lipid Profile: No results for input(s): CHOL, HDL, LDLCALC, TRIG, CHOLHDL, LDLDIRECT in the last 72 hours. Thyroid Function Tests: No results for input(s): TSH, T4TOTAL, FREET4, T3FREE, THYROIDAB in the last 72 hours. Anemia Panel: Recent Labs    09/16/21 0720  VITAMINB12 631  FOLATE 30.8      Radiology Studies: I have reviewed all of the imaging during this hospital visit personally     Scheduled Meds:  enoxaparin (LOVENOX) injection  30 mg Subcutaneous Q24H   pantoprazole (PROTONIX) IV  40 mg Intravenous QHS   sodium chloride flush  3 mL Intravenous Q12H   tamsulosin  0.8 mg Oral QPC supper   Continuous Infusions:  cefTRIAXone (ROCEPHIN)  IV Stopped (09/16/21 1755)   dextrose 5 % and 0.9% NaCl 75 mL/hr at 09/17/21 0400      LOS: 1 day        [name] Bundren Gerome Apley, MD

## 2021-09-17 NOTE — Progress Notes (Addendum)
[facility]) Hospital Liaison Note   Received request from Transitions of Care Manager for family interest in [facility]. Chart reviewed and spoke with family to acknowledge referral. Patient hospice eligibility status is approved. Unfortunately, [facility] is not able to offer a room today. Family and Transitions of Care Manager aware hospital liaison will follow up tomorrow or sooner if room becomes available. Please do not hesitate to call with questions.    Please call with any questions/concerns.    Thank you for the opportunity to participate in this patient's care.   Daphene Calamity, MSW [facility] Liaison  [phone]

## 2021-09-17 NOTE — Telephone Encounter (Signed)
[place] Night - Client TELEPHONE ADVICE RECORD [facility] Patient Name: [name] Small [place] IEN Gender: Male DOB: 21-Oct-1928 Age: 87 Y 10 M 10 D Return Phone Number: [phone] (Primary), [phone] (Secondary) Address: City/ State/ Zip: [place]  [address] Client [place] Night - Client Client Site [place] Physician Renford Dills - MD Contact Type Call Who Is Calling Patient / Member / Family / Caregiver Call Type Triage / Clinical Caller Name Joan Avetisyan Relationship To Patient Spouse Return Phone Number [phone] (Primary) Chief Complaint Urine, Blood In Reason for Call Symptomatic / Request for [place] states her husband someone is coming to get him into hospice. Caller states hour husband slept for 15 hours, has dark urine, and blood in urine. Translation No Nurse Assessment Nurse: Sabra Heck, RN, [place] Date/Time (Eastern Time): 09/15/2021 7:19:13 AM Confirm and document reason for call. If symptomatic, describe symptoms. ---Caller states her husband slept for 15 hours, has dark urine, and blood in urine. Noticed this morning around 1230am. He was in pain so she gave him yogart and a pain pill. he was restless and pulled his oxygen out of his nose. palliative care was out on wednesday. they know about his urine, but getting worse with the blood. Hospice is supposed to come tomorrow morning at 10 am. Does the patient have any new or worsening symptoms? ---Yes Will a triage be completed? ---Yes Related visit to physician within the last 2 weeks? ---Yes Does the PT have any chronic conditions? (i.e. diabetes, asthma, this includes High risk factors for pregnancy, etc.) ---Yes List chronic conditions. ---diabetes, CHF Is this a behavioral health or substance abuse call? ---No Guidelines Guideline Title Affirmed Question Affirmed Notes Nurse  Date/Time (Eastern Time) Urine - Blood In Passing pure blood or large blood clots (i.e., size > a dime) Sabra Heck, RN, [facility] 09/15/2021 7:22:25 AM PLEASE NOTE: All timestamps contained within this report are represented as [place] Standard Time. CONFIDENTIALTY NOTICE: This fax transmission is intended only for the addressee. It contains information that is legally privileged, confidential or otherwise protected from use or disclosure. If you are not the intended recipient, you are strictly prohibited from reviewing, disclosing, copying using or disseminating any of this information or taking any action in reliance on or regarding this information. If you have received this fax in error, please notify us immediately by telephone so that we can arrange for its return to [place]. Phone: [phone], Toll-Free: [phone], Fax: [phone] Page: 2 of 2 Call Id: [id number] Guidelines Guideline Title Affirmed Question Affirmed Notes Nurse Date/Time Eilene Ghazi Time) (Exception: fleck or small strands) Disp. Time Eilene Ghazi Time) Disposition Final User 09/15/2021 7:31:05 AM Called On-Call Provider Sabra Heck, RN, [facility] 09/15/2021 7:27:24 AM Go to ED Now Yes Sabra Heck, RN, [facility] Disagree/Comply Disagree Caller Understands Yes PreDisposition InappropriateToAsk Care Advice Given Per Guideline GO TO ED NOW: * You need to be seen in the Emergency Department. CARE ADVICE given per Urine, Blood In (Adult) guideline. Comments User: Carmie End, RN Date/Time Eilene Ghazi Time): 09/15/2021 7:23:01 AM had fall on wednesday night, fire dept came to get him up. User: Carmie End, RN Date/Time Eilene Ghazi Time): 09/15/2021 7:35:27 AM pt wife called back and informed that ocp states she Does not need to go to ED. office should f/u on monday. Referrals GO TO FACILITY REFUSED Paging DoctorName Phone DateTime Result/ Outcome Message Type Notes Owens Loffler - MD  [phone] 09/15/2021 7:31:05 AM Called On Call  Provider - Reached Doctor Paged Owens Loffler - MD 09/15/2021 7:33:56 AM Spoke with On Call - General Message Result OCP states that she does not need to go to ED. office should f/u on monda

## 2021-09-17 NOTE — Telephone Encounter (Signed)
Pt wife called stating she missed a call. She wasn't sure what about. She did note that pt will be going to [facility] upon discharging from the hospital.   Call back # [phone]

## 2021-09-17 NOTE — Telephone Encounter (Signed)
[place] Night - Client TELEPHONE ADVICE RECORD [facility] Patient Name: [name] Small Gender: Male DOB: [birth year] Age: 87 Y 10 M 10 D Return Phone Number: [phone] (Primary), [phone] (Secondary) Address: City/ State/ Zip: [place]  [address] Client [place] Night - Client Client Site [facility] Physician Renford Dills - MD Contact Type Call Who Is Calling Patient / Member / Family / Caregiver Call Type Triage / Clinical Caller Name doris Kiel Relationship To Patient Spouse Return Phone Number [phone] (Primary) Chief Complaint BLOOD SUGAR HIGH - 500 or greater Reason for Call Symptomatic / Request for [place] states her husbands oxygen is at 67 and sugar is back up ot 503 Translation No Nurse Assessment Nurse: Files, RN, Rachel Bo Date/Time (Eastern Time): 09/15/2021 4:12:40 PM Confirm and document reason for call. If symptomatic, describe symptoms. ---C[place]er states husband sugar is 67, and oxygen is 33, at the present husband isn't talking coherently, Does the patient have any new or worsening symptoms? ---Yes Will a triage be completed? ---Yes Related visit to physician within the last 2 weeks? ---No Does the PT have any chronic conditions? (i.e. diabetes, asthma, this includes High risk factors for pregnancy, etc.) ---Yes List chronic conditions. ---DM, Oxygen, patient is under palliative Is this a behavioral health or substance abuse call? ---No Guidelines Guideline Title Affirmed Question Affirmed Notes Nurse Date/Time (Eastern Time) Diabetes - High Blood Sugar Acting confused (e.g., disoriented, slurred speech) Files, RN, Dustin 09/15/2021 4:16:49 PM Disp. Time Eilene Ghazi Time) Disposition Final User 09/15/2021 4:10:30 PM Send to Urgent Irwin Brakeman 09/15/2021 4:22:46 PM 911 Outcome Documentation Files, RN,  Rachel Bo Reason: Caller states 911 is on their way PLEASE NOTE: All timestamps contained within this report are represented as [place] Standard Time. CONFIDENTIALTY NOTICE: This fax transmission is intended only for the addressee. It contains information that is legally privileged, confidential or otherwise protected from use or disclosure. If you are not the intended recipient, you are strictly prohibited from reviewing, disclosing, copying using or disseminating any of this information or taking any action in reliance on or regarding this information. If you have received this fax in error, please notify us immediately by telephone so that we can arrange for its return to [place]. Phone: [phone], Toll-Free: [phone], Fax: [phone] Page: 2 of 2 Call Id: [id number] 09/15/2021 4:17:51 PM Call EMS 911 Now Yes Files, RN, York Grice Disagree/Comply Comply Caller Understands Yes PreDisposition InappropriateToAsk Care Advice Given Per Guideline CALL EMS 911 NOW: * Immediate medical attention is needed. You need to hang up and call 911 (or an ambulance). * Triager Discretion: I'll call you back in a few minutes to be sure you were able to reach them. CARE ADVICE given per Diabetes - High Blood Sugar (Adult) guideline

## 2021-09-18 DIAGNOSIS — N3001 Acute cystitis with hematuria: Secondary | ICD-10-CM | POA: Diagnosis not present

## 2021-09-18 DIAGNOSIS — C679 Malignant neoplasm of bladder, unspecified: Secondary | ICD-10-CM | POA: Diagnosis not present

## 2021-09-18 DIAGNOSIS — N179 Acute kidney failure, unspecified: Secondary | ICD-10-CM | POA: Diagnosis not present

## 2021-09-18 DIAGNOSIS — D649 Anemia, unspecified: Secondary | ICD-10-CM | POA: Diagnosis not present

## 2021-09-18 NOTE — Discharge Summary (Signed)
Physician Discharge Summary  [name] Small LGX:[id number] DOB: 07-03-1928 DOA: 09/15/2021  PCP: Tonia Ghent, MD  Admit date: 09/15/2021 Discharge date: 09/18/2021  Admitted From: Home  Disposition:   Residential hospice.   Recommendations for Outpatient Follow-up and new medication changes:  Follow up with Hospice team.    Home Health: na   Equipment/Devices: na    Discharge Condition: stable  CODE STATUS: DNR  Diet recommendation:  as tolerated.   Brief/Interim Summary: Mr. [name] Small was admitted to the hospital with the working diagnosis of metabolic encephalopathy due to urinary tract infection.    87 year old male past medical history for heart failure, anemia, atrial fibrillation, hypertension, dyslipidemia, coronary artery disease, peripheral vascular disease, GERD, spinal stenosis, type 2 diabetes mellitus, bladder cancer, chronic urinary retention with self-catheterization who presented with altered mental status.  Patient was unable to give detailed history on admission,because of encephalopathy. Most information from his family, apparently patient had significantly decreased p.o. intake, generalized weakness and progressive confusion.  His urine was noted to be cloudy and bloody. Patient is currently outpatient palliative care, plans to admission to hospice after his hospitalization. He had a very rapid decline of his health over last several months, has become non ambulatory.  On his initial physical examination blood pressure 132/54, heart rate 85, respiratory rate 24, oxygen saturation 93%, his lungs were clear to auscultation bilaterally, heart S1-S2, present, rhythmic, soft abdomen, lower extremity edema.  Patient was disoriented.   Sodium 134, potassium 5.8, chloride 90, bicarb 37, glucose 412, BUN 49, creatinine 2.14, white count 10.0, hemoglobin 11.3, hematocrit 37.6, platelets 205. SARS COVID-19 negative.   Urinalysis, specific gravity 1.013, > 50 white cells, 60-10  red cells.   Chest radiograph with right rotation, no infiltrates.   EKG 86 bpm, normal axis, normal intervals, sinus rhythm with PACs, no significant ST segment or T wave changes.   Patient was been placed on antibiotic therapy and IV fluids.    Patient with poor prognosis and transitioned to full comfort care, now being transfer to residential hospice for further care.     Urinary tract infection complicated with severe sepsis, endorgan failure acute kidney injury, acute hypoxic respiratory failure, metabolic encephalopathy.  Patient was admitted to the medical ward, he initially received antibiotic therapy with ceftriaxone and intravenous fluids with isotonic saline.  He has multiorgan failure, poor prognosis, his family decided to continue care under comfort measures. After 2 doses of intravenous ceftriaxone antibiotic therapy was discontinued.  He was allowed to eat a dysphagia 2 diet and IV fluids were discontinued.  He was placed on palliative care protocol, social services consulted and patient being transferred to residential hospice.  2.  Type 2 diabetes mellitus, hyper/hypoglycemia.  Dyslipidemia.  Patient had uncontrolled type 2 diabetes mellitus, predominantly hypoglycemia. His capillary glucose ranged between 88 and 109 at his discharge. Hold on antihyperglycemic agents.  3.  COPD.  No signs of acute exacerbation.  He did have oxygen desaturation, documented down to 66%. Continue comfort measures at residential hospice.  4.  Chronic diastolic heart failure/ HTN.  No signs of acute exacerbation. Blood pressure remained controlled.   5.  Paroxysmal atrial fibrillation.  Patient remained rate controlled, not on anticoagulation due to high risk of fall and bleeding.  6.  Chronic anemia, malignancy related, bladder cancer. His hemoglobin and hematocrit remained stable.  Discharge hemoglobin 10.2, hematocrit 34.0.  7.  Acute kidney injury on CKD stage 3b.  His follow-up  creatinine was 2.0, no further  blood work considering his poor prognosis.  8.  BPH with self-catheterization.  Patient had a indwelling Foley catheter placed during his hospitalization as part of palliative care protocol.  Discharge Diagnoses:  Principal Problem:   UTI (urinary tract infection) Active Problems:   BPH (benign prostatic hyperplasia)   PSORIASIS, SCALP   CAD (coronary artery disease) of artery bypass graft   Bladder cancer (HCC)   Atrial fibrillation (HCC)   Hyperlipidemia   COPD (chronic obstructive pulmonary disease) (HCC)   Anemia   Acute renal failure superimposed on stage 3b chronic kidney disease ([place])   Type 2 diabetes mellitus with other specified complication (HCC)   Benign essential HTN   Urinary retention with incomplete bladder emptying   Gastroesophageal reflux disease with esophagitis without hemorrhage   Encephalopathy   AKI (acute kidney injury) ([place])    Discharge Instructions   Allergies as of 09/18/2021       Reactions   Actos [pioglitazone Hydrochloride] Other (See Comments)   Held 2012 bladder cancer   Aspirin Other (See Comments)   Held 2012 due to hematuria and bruising.    Ace Inhibitors Cough   Bactrim Rash, Other (See Comments)   Rash, presumed allergy   Gabapentin Nausea And Vomiting   Upset stomach and diarrhea - tolerates low doses   Lyrica [pregabalin] Swelling   swelling   Pravastatin Swelling   Swelling of feet   Sulfa Drugs Cross Reactors Rash        Medication List     STOP taking these medications    Apremilast 30 MG Tabs   atorvastatin 10 MG tablet Commonly known as: LIPITOR   cephALEXin 500 MG capsule Commonly known as: KEFLEX   clobetasol 0.05 % external solution Commonly known as: TEMOVATE   CRANBERRY PO   docusate sodium 100 MG capsule Commonly known as: COLACE   doxycycline 50 MG tablet Commonly known as: ADOXA   finasteride 5 MG tablet Commonly known as: PROSCAR   furosemide 20 MG  tablet Commonly known as: Lasix   insulin glargine 100 UNIT/ML Solostar Pen Commonly known as: LANTUS   insulin glargine-yfgn 100 UNIT/ML injection Commonly known as: SEMGLEE   multivitamin with minerals Tabs tablet   omeprazole 40 MG capsule Commonly known as: PRILOSEC   OXYGEN   tamsulosin 0.4 MG Caps capsule Commonly known as: FLOMAX   tiotropium 18 MCG inhalation capsule Commonly known as: SPIRIVA   traMADol 50 MG tablet Commonly known as: ULTRAM   traZODone 50 MG tablet Commonly known as: DESYREL   Vitamin D 125 MCG (5000 UT) Caps       TAKE these medications    acetaminophen 325 MG tablet Commonly known as: TYLENOL Take 1-2 tablets (325-650 mg total) by mouth every 4 (four) hours as needed for mild pain.   fluticasone-salmeterol 250-50 MCG/ACT Aepb Commonly known as: ADVAIR Take 1 puff by mouth 2 (two) times daily as needed (shortness of breath).        Allergies  Allergen Reactions   Actos [Pioglitazone Hydrochloride] Other (See Comments)    Held 2012 bladder cancer   Aspirin Other (See Comments)    Held 2012 due to hematuria and bruising.    Ace Inhibitors Cough   Bactrim Rash and Other (See Comments)    Rash, presumed allergy   Gabapentin Nausea And Vomiting    Upset stomach and diarrhea - tolerates low doses   Lyrica [Pregabalin] Swelling    swelling   Pravastatin Swelling    Swelling  of feet   Sulfa Drugs Cross Reactors Rash     Procedures/Studies: DG Chest Port 1 View  Result Date: 09/15/2021 CLINICAL DATA:  Questionable sepsis. EXAM: PORTABLE CHEST 1 VIEW COMPARISON:  June 11, 2021 FINDINGS: Tortuosity and calcific atherosclerotic disease of the aorta. Cardiomediastinal silhouette is normal. Mediastinal contours appear intact. Subtle airspace consolidation versus atelectasis in the left lung base. Osseous structures are without acute abnormality. Soft tissues are grossly normal. IMPRESSION: Subtle airspace consolidation versus  atelectasis in the left lung base. Electronically Signed   By: Fidela Salisbury M.D.   On: 09/15/2021 19:10       Subjective: Patient is awake, not much interactive, his family is at the bedside, no apparent pain or dyspnea.   Discharge Exam: Vitals:   09/18/21 0431 09/18/21 0431  BP: 131/79 131/79  Pulse: 74 75  Resp:  19  Temp: 99 F (37.2 C) 99 F (37.2 C)  SpO2: 95% 94%   Vitals:   09/17/21 0733 09/17/21 1744 09/18/21 0431 09/18/21 0431  BP: 127/62 (!) 154/71 131/79 131/79  Pulse: 75 77 74 75  Resp: 16 20  19   Temp: 98.4 F (36.9 C) 97.9 F (36.6 C) 99 F (37.2 C) 99 F (37.2 C)  TempSrc: Oral Oral Oral Oral  SpO2: 94% 97% 95% 94%  Weight:      Height:        General: deconditioned  Neurology: Awake and alert, not much interactive to my examination  E ENT: no pallor, no icterus, oral mucosa moist Cardiovascular: No JVD. S1-S2 present, rhythmic Pulmonary: positive breath sounds bilaterally, Gastrointestinal. Abdomen soft and non tender Skin. No rashes Musculoskeletal: no joint deformities   The results of significant diagnostics from this hospitalization (including imaging, microbiology, ancillary and laboratory) are listed below for reference.     Microbiology: Recent Results (from the past 240 hour(s))  Urine Culture     Status: Abnormal   Collection Time: 09/15/21  6:09 PM   Specimen: In/Out Cath Urine  Result Value Ref Range Status   Specimen Description IN/OUT CATH URINE  Final   Special Requests   Final    NONE Performed at [facility], [address], [place], [place] [address]    Culture 4,000 COLONIES/mL ENTEROCOCCUS FAECALIS (A)  Final   Report Status 09/17/2021 FINAL  Final   Organism ID, Bacteria ENTEROCOCCUS FAECALIS (A)  Final      Susceptibility   Enterococcus faecalis - MIC*    AMPICILLIN <=2 SENSITIVE Sensitive     NITROFURANTOIN <=16 SENSITIVE Sensitive     VANCOMYCIN 2 SENSITIVE Sensitive     * 4,000 COLONIES/mL  ENTEROCOCCUS FAECALIS  Blood Culture (routine x 2)     Status: None (Preliminary result)   Collection Time: 09/15/21  6:40 PM   Specimen: BLOOD RIGHT FOREARM  Result Value Ref Range Status   Specimen Description BLOOD RIGHT FOREARM  Final   Special Requests   Final    BOTTLES DRAWN AEROBIC AND ANAEROBIC Blood Culture results may not be optimal due to an inadequate volume of blood received in culture bottles   Culture   Final    NO GROWTH 3 DAYS Performed at [facility], [place] [address] [place], [place] [address]    Report Status PENDING  Incomplete  Resp Panel by RT-PCR (Flu A&B, Covid) Nasopharyngeal Swab     Status: None   Collection Time: 09/15/21  8:39 PM   Specimen: Nasopharyngeal Swab; Nasopharyngeal(NP) swabs in vial transport  medium  Result Value Ref Range Status   SARS Coronavirus 2 by RT PCR NEGATIVE NEGATIVE Final    Comment: (NOTE) SARS-CoV-2 target nucleic acids are NOT DETECTED.  The SARS-CoV-2 RNA is generally detectable in upper respiratory specimens during the acute phase of infection. The lowest concentration of SARS-CoV-2 viral copies this assay can detect is 138 copies/mL. A negative result does not preclude SARS-Cov-2 infection and should not be used as the sole basis for treatment or other patient management decisions. A negative result may occur with  improper specimen collection/handling, submission of specimen other than nasopharyngeal swab, presence of viral mutation(s) within the areas targeted by this assay, and inadequate number of viral copies(<138 copies/mL). A negative result must be combined with clinical observations, patient history, and epidemiological information. The expected result is Negative.  Fact Sheet for Patients:  EntrepreneurPulse.com.au  Fact Sheet for Healthcare Providers:  IncredibleEmployment.be  This test is no t yet approved or cleared by the [place] FDA and  has been  authorized for detection and/or diagnosis of SARS-CoV-2 by FDA under an Emergency Use Authorization (EUA). This EUA will remain  in effect (meaning this test can be used) for the duration of the COVID-19 declaration under Section 564(b)(1) of the Act, 21 U.S.C.section 360bbb-3(b)(1), unless the authorization is terminated  or revoked sooner.       Influenza A by PCR NEGATIVE NEGATIVE Final   Influenza B by PCR NEGATIVE NEGATIVE Final    Comment: (NOTE) The Xpert Xpress SARS-CoV-2/FLU/RSV plus assay is intended as an aid in the diagnosis of influenza from Nasopharyngeal swab specimens and should not be used as a sole basis for treatment. Nasal washings and aspirates are unacceptable for Xpert Xpress SARS-CoV-2/FLU/RSV testing.  Fact Sheet for Patients: EntrepreneurPulse.com.au  Fact Sheet for Healthcare Providers: IncredibleEmployment.be  This test is not yet approved or cleared by the [place] FDA and has been authorized for detection and/or diagnosis of SARS-CoV-2 by FDA under an Emergency Use Authorization (EUA). This EUA will remain in effect (meaning this test can be used) for the duration of the COVID-19 declaration under Section 564(b)(1) of the Act, 21 U.S.C. section 360bbb-3(b)(1), unless the authorization is terminated or revoked.  Performed at [facility], [place] [address], [place], [place] [address]   Blood Culture (routine x 2)     Status: None (Preliminary result)   Collection Time: 09/15/21  9:05 PM   Specimen: BLOOD  Result Value Ref Range Status   Specimen Description BLOOD SITE NOT SPECIFIED  Final   Special Requests   Final    BOTTLES DRAWN AEROBIC AND ANAEROBIC Blood Culture adequate volume   Culture   Final    NO GROWTH 3 DAYS Performed at [facility], [address], [place], [place] [address]    Report Status PENDING  Incomplete     Labs: BNP (last 3 results) Recent Labs    06/11/21 0732   BNP 829.5*   Basic Metabolic Panel: Recent Labs  Lab 09/15/21 1809 09/16/21 0720 09/17/21 0041  NA 134* 138 139  K 5.8* 5.0 4.7  CL 90* 96* 96*  CO2 37* 31 36*  GLUCOSE 412* 88 48*  BUN 49* 48* 53*  CREATININE 2.14* 2.00* 2.03*  CALCIUM 8.4* 8.4* 8.1*   Liver Function Tests: Recent Labs  Lab 09/15/21 1809 09/16/21 0720  AST 12* 16  ALT 11 11  ALKPHOS 63 54  BILITOT 0.6 0.5  PROT 6.4* 6.1*  ALBUMIN 3.0* 2.9*  No results for input(s): LIPASE, AMYLASE in the last 168 hours. No results for input(s): AMMONIA in the last 168 hours. CBC: Recent Labs  Lab 09/15/21 1809 09/16/21 0720 09/17/21 0041  WBC 10.0 15.1* 11.6*  NEUTROABS 7.6  --   --   HGB 11.3* 11.5* 10.2*  HCT 37.6* 37.7* 34.0*  MCV 108.0* 105.6* 107.3*  PLT 205 209 217   Cardiac Enzymes: No results for input(s): CKTOTAL, CKMB, CKMBINDEX, TROPONINI in the last 168 hours. BNP: Invalid input(s): POCBNP CBG: Recent Labs  Lab 09/16/21 1415 09/17/21 0347 09/17/21 0446 09/17/21 1247 09/17/21 1748  GLUCAP 81 27* 119* 88 109*   D-Dimer No results for input(s): DDIMER in the last 72 hours. Hgb A1c No results for input(s): HGBA1C in the last 72 hours. Lipid Profile No results for input(s): CHOL, HDL, LDLCALC, TRIG, CHOLHDL, LDLDIRECT in the last 72 hours. Thyroid function studies No results for input(s): TSH, T4TOTAL, T3FREE, THYROIDAB in the last 72 hours.  Invalid input(s): FREET3 Anemia work up Recent Labs    09/16/21 0720  VITAMINB12 631  FOLATE 30.8   Urinalysis    Component Value Date/Time   COLORURINE AMBER (A) 09/15/2021 1849   APPEARANCEUR CLOUDY (A) 09/15/2021 1849   LABSPEC 1.013 09/15/2021 1849   PHURINE 5.0 09/15/2021 1849   GLUCOSEU >=500 (A) 09/15/2021 1849   GLUCOSEU 250 (A) 06/09/2020 1331   HGBUR LARGE (A) 09/15/2021 1849   HGBUR trace-intact 11/25/2007 [place] 09/15/2021 1849   BILIRUBINUR neg 06/19/2021 [place] 09/15/2021 1849    PROTEINUR 30 (A) 09/15/2021 1849   UROBILINOGEN 0.2 06/19/2021 1520   UROBILINOGEN 0.2 06/09/2020 1331   NITRITE NEGATIVE 09/15/2021 1849   LEUKOCYTESUR LARGE (A) 09/15/2021 1849   Sepsis Labs Invalid input(s): PROCALCITONIN,  WBC,  LACTICIDVEN Microbiology Recent Results (from the past 240 hour(s))  Urine Culture     Status: Abnormal   Collection Time: 09/15/21  6:09 PM   Specimen: In/Out Cath Urine  Result Value Ref Range Status   Specimen Description IN/OUT CATH URINE  Final   Special Requests   Final    NONE Performed at [facility], [place] [address], [place], [place] [address]    Culture 4,000 COLONIES/mL ENTEROCOCCUS FAECALIS (A)  Final   Report Status 09/17/2021 FINAL  Final   Organism ID, Bacteria ENTEROCOCCUS FAECALIS (A)  Final      Susceptibility   Enterococcus faecalis - MIC*    AMPICILLIN <=2 SENSITIVE Sensitive     NITROFURANTOIN <=16 SENSITIVE Sensitive     VANCOMYCIN 2 SENSITIVE Sensitive     * 4,000 COLONIES/mL ENTEROCOCCUS FAECALIS  Blood Culture (routine x 2)     Status: None (Preliminary result)   Collection Time: 09/15/21  6:40 PM   Specimen: BLOOD RIGHT FOREARM  Result Value Ref Range Status   Specimen Description BLOOD RIGHT FOREARM  Final   Special Requests   Final    BOTTLES DRAWN AEROBIC AND ANAEROBIC Blood Culture results may not be optimal due to an inadequate volume of blood received in culture bottles   Culture   Final    NO GROWTH 3 DAYS Performed at [facility], [place] [address], [place], [place] [address]    Report Status PENDING  Incomplete  Resp Panel by RT-PCR (Flu A&B, Covid) Nasopharyngeal Swab     Status: None   Collection Time: 09/15/21  8:39 PM   Specimen: Nasopharyngeal Swab; Nasopharyngeal(NP) swabs in vial transport medium  Result Value Ref Range Status   SARS Coronavirus 2 by RT PCR NEGATIVE NEGATIVE Final    Comment: (NOTE) SARS-CoV-2 target nucleic acids are NOT DETECTED.  The SARS-CoV-2 RNA is generally  detectable in upper respiratory specimens during the acute phase of infection. The lowest concentration of SARS-CoV-2 viral copies this assay can detect is 138 copies/mL. A negative result does not preclude SARS-Cov-2 infection and should not be used as the sole basis for treatment or other patient management decisions. A negative result may occur with  improper specimen collection/handling, submission of specimen other than nasopharyngeal swab, presence of viral mutation(s) within the areas targeted by this assay, and inadequate number of viral copies(<138 copies/mL). A negative result must be combined with clinical observations, patient history, and epidemiological information. The expected result is Negative.  Fact Sheet for Patients:  EntrepreneurPulse.com.au  Fact Sheet for Healthcare Providers:  IncredibleEmployment.be  This test is no t yet approved or cleared by the [place] FDA and  has been authorized for detection and/or diagnosis of SARS-CoV-2 by FDA under an Emergency Use Authorization (EUA). This EUA will remain  in effect (meaning this test can be used) for the duration of the COVID-19 declaration under Section 564(b)(1) of the Act, 21 U.S.C.section 360bbb-3(b)(1), unless the authorization is terminated  or revoked sooner.       Influenza A by PCR NEGATIVE NEGATIVE Final   Influenza B by PCR NEGATIVE NEGATIVE Final    Comment: (NOTE) The Xpert Xpress SARS-CoV-2/FLU/RSV plus assay is intended as an aid in the diagnosis of influenza from Nasopharyngeal swab specimens and should not be used as a sole basis for treatment. Nasal washings and aspirates are unacceptable for Xpert Xpress SARS-CoV-2/FLU/RSV testing.  Fact Sheet for Patients: EntrepreneurPulse.com.au  Fact Sheet for Healthcare Providers: IncredibleEmployment.be  This test is not yet approved or cleared by the [place] FDA  and has been authorized for detection and/or diagnosis of SARS-CoV-2 by FDA under an Emergency Use Authorization (EUA). This EUA will remain in effect (meaning this test can be used) for the duration of the COVID-19 declaration under Section 564(b)(1) of the Act, 21 U.S.C. section 360bbb-3(b)(1), unless the authorization is terminated or revoked.  Performed at [facility], [place] [address], [place], [place] [address]   Blood Culture (routine x 2)     Status: None (Preliminary result)   Collection Time: 09/15/21  9:05 PM   Specimen: BLOOD  Result Value Ref Range Status   Specimen Description BLOOD SITE NOT SPECIFIED  Final   Special Requests   Final    BOTTLES DRAWN AEROBIC AND ANAEROBIC Blood Culture adequate volume   Culture   Final    NO GROWTH 3 DAYS Performed at [facility], [address] [place], [place] [address]    Report Status PENDING  Incomplete     Time coordinating discharge: 45 minutes  SIGNED:   Tawni Millers, MD  [facility] 09/18/2021, 9:01 AM

## 2021-09-18 NOTE — TOC Transition Note (Signed)
Transition of Care [facility]) - CM/SW Discharge Note   Patient Details  Name: [name] Small MRN: [id number] Date of Birth: [birth year]  Transition of Care [facility]) CM/SW Contact:  Joanne Chars, LCSW Phone Number: 09/18/2021, 10:32 AM   Clinical Narrative:   Pt discharging to [facility].  RN call report to [phone].    Final next level of care: [place] Barriers to Discharge: Barriers Resolved   Patient Goals and CMS Choice        Discharge Placement              Patient chooses bed at:  [facility]) Patient to be transferred to facility by: [place] Name of family member notified: wife, [name] Patient and family notified of of transfer: 09/18/21  Discharge Plan and Services                DME Arranged: N/A         HH Arranged: NA          Social Determinants of Health (SDOH) Interventions     Readmission Risk Interventions Readmission Risk Prevention Plan 04/21/2020  Transportation Screening Complete  PCP or Specialist Appt within 5-7 Days Complete  Home Care Screening Complete  Medication Review (RN CM) Complete  Some recent data might be hidden

## 2021-09-18 NOTE — Telephone Encounter (Signed)
Late entry.  Called mult phone numbers yesterday just prior to [place] but not confidential info.  Then called hospital and talked with daughter.  Condolences offered and she thanked me for the call.

## 2021-09-18 NOTE — Progress Notes (Signed)
Called report to [facility] and spoke to [employer] RN request to keep the IV in place on transport. Pt will also will be transferring with foley. Waiting on PTAR, family is aware.

## 2021-09-18 NOTE — Progress Notes (Signed)
[facility]) Hospital Liaison Note  TOC/Greg notified PTAR of patient discharge, per his request, and transportation has been arranged. MSW notified [facility]/[name] of transportation arrangement.   Please send signed DNR form with patient and RN call report to [phone]  Please call with any questions/concerns.    Thank you for the opportunity to participate in this patient's care.   Daphene Calamity, MSW [facility] Liaison  [phone]

## 2021-09-19 NOTE — Telephone Encounter (Signed)
Late entry, called pt's wife about 5:30 on 09/18/21.  Discussed with her about his recent situation/decline and transfer to [facility].  Family opted for comfort care given the situation and I support their decision.  I thanked her for her effort and I asked her to please update me if I could be of service in the meantime.  She thanked me for the call and I thanked her for taking the call.

## 2021-09-20 LAB — CULTURE, BLOOD (ROUTINE X 2)
Culture: NO GROWTH
Culture: NO GROWTH
Special Requests: ADEQUATE

## 2021-09-26 ENCOUNTER — Telehealth: Payer: Self-pay | Admitting: Family Medicine

## 2021-09-26 NOTE — Telephone Encounter (Signed)
Late entry.  Called his widow to give my condolences on 09/24/21.  I was always glad to see this gentleman in clinic.  He was a kind man and he will be missed.  I thanked her for taking my call and she said she appreciated the call.

## 2021-09-28 ENCOUNTER — Ambulatory Visit: Payer: Medicare Other | Admitting: Medical

## 2021-10-11 NOTE — Telephone Encounter (Signed)
This encounter was created in error - please disregard.

## 2021-10-11 DEATH — deceased

## 2021-10-23 ENCOUNTER — Telehealth: Payer: Medicare Other
